# Patient Record
Sex: Male | Born: 1940
Health system: Southern US, Community
[De-identification: ages and names within clinical notes are randomized; demographics above are authoritative.]

## PROBLEM LIST (undated history)

## (undated) DIAGNOSIS — I219 Acute myocardial infarction, unspecified: Secondary | ICD-10-CM

## (undated) DIAGNOSIS — N183 Chronic kidney disease, stage 3 unspecified: Secondary | ICD-10-CM

## (undated) DIAGNOSIS — F32A Depression, unspecified: Secondary | ICD-10-CM

## (undated) DIAGNOSIS — J189 Pneumonia, unspecified organism: Secondary | ICD-10-CM

## (undated) DIAGNOSIS — I209 Angina pectoris, unspecified: Secondary | ICD-10-CM

## (undated) DIAGNOSIS — R0600 Dyspnea, unspecified: Secondary | ICD-10-CM

## (undated) DIAGNOSIS — J449 Chronic obstructive pulmonary disease, unspecified: Secondary | ICD-10-CM

## (undated) DIAGNOSIS — I10 Essential (primary) hypertension: Secondary | ICD-10-CM

## (undated) DIAGNOSIS — F329 Major depressive disorder, single episode, unspecified: Secondary | ICD-10-CM

## (undated) DIAGNOSIS — T4145XA Adverse effect of unspecified anesthetic, initial encounter: Secondary | ICD-10-CM

## (undated) DIAGNOSIS — Z87442 Personal history of urinary calculi: Secondary | ICD-10-CM

## (undated) DIAGNOSIS — E785 Hyperlipidemia, unspecified: Secondary | ICD-10-CM

## (undated) DIAGNOSIS — K219 Gastro-esophageal reflux disease without esophagitis: Secondary | ICD-10-CM

## (undated) DIAGNOSIS — F419 Anxiety disorder, unspecified: Secondary | ICD-10-CM

## (undated) DIAGNOSIS — Z720 Tobacco use: Secondary | ICD-10-CM

## (undated) DIAGNOSIS — M1990 Unspecified osteoarthritis, unspecified site: Secondary | ICD-10-CM

## (undated) DIAGNOSIS — K449 Diaphragmatic hernia without obstruction or gangrene: Secondary | ICD-10-CM

## (undated) DIAGNOSIS — I472 Ventricular tachycardia, unspecified: Secondary | ICD-10-CM

## (undated) DIAGNOSIS — K851 Biliary acute pancreatitis without necrosis or infection: Secondary | ICD-10-CM

## (undated) DIAGNOSIS — Z95810 Presence of automatic (implantable) cardiac defibrillator: Secondary | ICD-10-CM

## (undated) DIAGNOSIS — I5022 Chronic systolic (congestive) heart failure: Secondary | ICD-10-CM

## (undated) DIAGNOSIS — I2510 Atherosclerotic heart disease of native coronary artery without angina pectoris: Secondary | ICD-10-CM

## (undated) DIAGNOSIS — C44311 Basal cell carcinoma of skin of nose: Secondary | ICD-10-CM

## (undated) DIAGNOSIS — N400 Enlarged prostate without lower urinary tract symptoms: Secondary | ICD-10-CM

## (undated) DIAGNOSIS — I729 Aneurysm of unspecified site: Secondary | ICD-10-CM

## (undated) DIAGNOSIS — K5730 Diverticulosis of large intestine without perforation or abscess without bleeding: Secondary | ICD-10-CM

## (undated) DIAGNOSIS — R739 Hyperglycemia, unspecified: Secondary | ICD-10-CM

## (undated) HISTORY — PX: HEMORRHOID BANDING: SHX5850

## (undated) HISTORY — DX: Atherosclerotic heart disease of native coronary artery without angina pectoris: I25.10

## (undated) HISTORY — DX: Gastro-esophageal reflux disease without esophagitis: K21.9

## (undated) HISTORY — DX: Hyperlipidemia, unspecified: E78.5

## (undated) HISTORY — PX: INGUINAL HERNIA REPAIR: SUR1180

## (undated) HISTORY — DX: Chronic obstructive pulmonary disease, unspecified: J44.9

## (undated) HISTORY — DX: Diaphragmatic hernia without obstruction or gangrene: K44.9

## (undated) HISTORY — DX: Biliary acute pancreatitis without necrosis or infection: K85.10

## (undated) HISTORY — PX: COLONOSCOPY: SHX174

## (undated) HISTORY — PX: EYE SURGERY: SHX253

## (undated) HISTORY — DX: Essential (primary) hypertension: I10

## (undated) HISTORY — PX: CATARACT EXTRACTION W/ INTRAOCULAR LENS  IMPLANT, BILATERAL: SHX1307

## (undated) SURGERY — VIDEO BRONCHOSCOPY WITHOUT FLUORO
Anesthesia: Moderate Sedation

---

## 1998-05-27 ENCOUNTER — Inpatient Hospital Stay (HOSPITAL_COMMUNITY): Admission: EM | Admit: 1998-05-27 | Discharge: 1998-05-29 | Payer: Self-pay | Admitting: Emergency Medicine

## 1998-05-27 ENCOUNTER — Encounter: Payer: Self-pay | Admitting: Emergency Medicine

## 2000-02-08 ENCOUNTER — Encounter: Payer: Self-pay | Admitting: Emergency Medicine

## 2000-02-08 ENCOUNTER — Emergency Department (HOSPITAL_COMMUNITY): Admission: EM | Admit: 2000-02-08 | Discharge: 2000-02-08 | Payer: Self-pay | Admitting: Emergency Medicine

## 2000-05-22 ENCOUNTER — Emergency Department (HOSPITAL_COMMUNITY): Admission: EM | Admit: 2000-05-22 | Discharge: 2000-05-22 | Payer: Self-pay | Admitting: Emergency Medicine

## 2001-08-15 ENCOUNTER — Encounter: Payer: Self-pay | Admitting: Internal Medicine

## 2001-08-15 ENCOUNTER — Encounter: Admission: RE | Admit: 2001-08-15 | Discharge: 2001-08-15 | Payer: Self-pay | Admitting: Internal Medicine

## 2002-01-07 ENCOUNTER — Inpatient Hospital Stay (HOSPITAL_COMMUNITY): Admission: EM | Admit: 2002-01-07 | Discharge: 2002-01-08 | Payer: Self-pay | Admitting: Emergency Medicine

## 2002-01-07 ENCOUNTER — Encounter: Payer: Self-pay | Admitting: Emergency Medicine

## 2003-03-31 HISTORY — PX: FOOT SURGERY: SHX648

## 2005-03-27 ENCOUNTER — Ambulatory Visit: Payer: Self-pay | Admitting: Internal Medicine

## 2005-09-25 ENCOUNTER — Encounter: Admission: RE | Admit: 2005-09-25 | Discharge: 2005-09-25 | Payer: Self-pay | Admitting: Internal Medicine

## 2006-03-30 HISTORY — PX: MOHS SURGERY: SUR867

## 2009-09-06 HISTORY — PX: IMPLANTABLE CARDIOVERTER DEFIBRILLATOR IMPLANT: SHX5860

## 2011-03-31 HISTORY — PX: RETINAL DETACHMENT SURGERY: SHX105

## 2011-04-06 DIAGNOSIS — I472 Ventricular tachycardia: Secondary | ICD-10-CM | POA: Diagnosis not present

## 2011-05-28 DIAGNOSIS — E785 Hyperlipidemia, unspecified: Secondary | ICD-10-CM | POA: Diagnosis not present

## 2011-05-28 DIAGNOSIS — J449 Chronic obstructive pulmonary disease, unspecified: Secondary | ICD-10-CM | POA: Diagnosis not present

## 2011-05-28 DIAGNOSIS — I10 Essential (primary) hypertension: Secondary | ICD-10-CM | POA: Diagnosis not present

## 2011-05-28 DIAGNOSIS — F17200 Nicotine dependence, unspecified, uncomplicated: Secondary | ICD-10-CM | POA: Diagnosis not present

## 2011-06-29 DIAGNOSIS — R5381 Other malaise: Secondary | ICD-10-CM | POA: Diagnosis not present

## 2011-06-29 DIAGNOSIS — R5383 Other fatigue: Secondary | ICD-10-CM | POA: Diagnosis not present

## 2011-06-29 DIAGNOSIS — E7800 Pure hypercholesterolemia, unspecified: Secondary | ICD-10-CM | POA: Diagnosis not present

## 2011-07-23 DIAGNOSIS — E785 Hyperlipidemia, unspecified: Secondary | ICD-10-CM | POA: Diagnosis not present

## 2011-07-23 DIAGNOSIS — I472 Ventricular tachycardia: Secondary | ICD-10-CM | POA: Diagnosis not present

## 2011-07-23 DIAGNOSIS — I10 Essential (primary) hypertension: Secondary | ICD-10-CM | POA: Diagnosis not present

## 2011-07-23 DIAGNOSIS — I2510 Atherosclerotic heart disease of native coronary artery without angina pectoris: Secondary | ICD-10-CM | POA: Diagnosis not present

## 2011-08-21 DIAGNOSIS — H43819 Vitreous degeneration, unspecified eye: Secondary | ICD-10-CM | POA: Diagnosis not present

## 2011-08-21 DIAGNOSIS — H04129 Dry eye syndrome of unspecified lacrimal gland: Secondary | ICD-10-CM | POA: Diagnosis not present

## 2011-09-11 DIAGNOSIS — J449 Chronic obstructive pulmonary disease, unspecified: Secondary | ICD-10-CM | POA: Diagnosis not present

## 2011-09-14 DIAGNOSIS — Z961 Presence of intraocular lens: Secondary | ICD-10-CM | POA: Diagnosis not present

## 2011-09-14 DIAGNOSIS — H04129 Dry eye syndrome of unspecified lacrimal gland: Secondary | ICD-10-CM | POA: Diagnosis not present

## 2011-09-14 DIAGNOSIS — H43819 Vitreous degeneration, unspecified eye: Secondary | ICD-10-CM | POA: Diagnosis not present

## 2011-10-03 DIAGNOSIS — R579 Shock, unspecified: Secondary | ICD-10-CM | POA: Diagnosis not present

## 2011-10-03 DIAGNOSIS — I472 Ventricular tachycardia, unspecified: Secondary | ICD-10-CM | POA: Diagnosis not present

## 2011-10-03 DIAGNOSIS — I509 Heart failure, unspecified: Secondary | ICD-10-CM | POA: Diagnosis not present

## 2011-10-03 DIAGNOSIS — R5381 Other malaise: Secondary | ICD-10-CM | POA: Diagnosis not present

## 2011-10-03 DIAGNOSIS — E785 Hyperlipidemia, unspecified: Secondary | ICD-10-CM | POA: Diagnosis not present

## 2011-10-03 DIAGNOSIS — I4729 Other ventricular tachycardia: Secondary | ICD-10-CM | POA: Diagnosis not present

## 2011-10-03 DIAGNOSIS — I252 Old myocardial infarction: Secondary | ICD-10-CM | POA: Diagnosis not present

## 2011-10-03 DIAGNOSIS — Z95810 Presence of automatic (implantable) cardiac defibrillator: Secondary | ICD-10-CM | POA: Diagnosis not present

## 2011-10-03 DIAGNOSIS — K219 Gastro-esophageal reflux disease without esophagitis: Secondary | ICD-10-CM | POA: Diagnosis present

## 2011-10-03 DIAGNOSIS — J45909 Unspecified asthma, uncomplicated: Secondary | ICD-10-CM | POA: Diagnosis not present

## 2011-10-03 DIAGNOSIS — I499 Cardiac arrhythmia, unspecified: Secondary | ICD-10-CM | POA: Diagnosis not present

## 2011-10-03 DIAGNOSIS — I10 Essential (primary) hypertension: Secondary | ICD-10-CM | POA: Diagnosis present

## 2011-10-03 DIAGNOSIS — I2510 Atherosclerotic heart disease of native coronary artery without angina pectoris: Secondary | ICD-10-CM | POA: Diagnosis not present

## 2011-10-03 DIAGNOSIS — J449 Chronic obstructive pulmonary disease, unspecified: Secondary | ICD-10-CM | POA: Diagnosis present

## 2011-10-03 DIAGNOSIS — T82190A Other mechanical complication of cardiac electrode, initial encounter: Secondary | ICD-10-CM | POA: Diagnosis not present

## 2011-10-03 DIAGNOSIS — E7800 Pure hypercholesterolemia, unspecified: Secondary | ICD-10-CM | POA: Diagnosis not present

## 2011-10-03 DIAGNOSIS — R42 Dizziness and giddiness: Secondary | ICD-10-CM | POA: Diagnosis not present

## 2011-10-03 DIAGNOSIS — F17200 Nicotine dependence, unspecified, uncomplicated: Secondary | ICD-10-CM | POA: Diagnosis present

## 2011-10-03 DIAGNOSIS — I059 Rheumatic mitral valve disease, unspecified: Secondary | ICD-10-CM | POA: Diagnosis not present

## 2011-10-03 DIAGNOSIS — Z79899 Other long term (current) drug therapy: Secondary | ICD-10-CM | POA: Diagnosis not present

## 2011-10-12 DIAGNOSIS — Z961 Presence of intraocular lens: Secondary | ICD-10-CM | POA: Diagnosis not present

## 2011-10-12 DIAGNOSIS — H5310 Unspecified subjective visual disturbances: Secondary | ICD-10-CM | POA: Diagnosis not present

## 2011-10-12 DIAGNOSIS — H33019 Retinal detachment with single break, unspecified eye: Secondary | ICD-10-CM | POA: Diagnosis not present

## 2011-10-12 DIAGNOSIS — H33319 Horseshoe tear of retina without detachment, unspecified eye: Secondary | ICD-10-CM | POA: Diagnosis not present

## 2011-10-12 DIAGNOSIS — H43819 Vitreous degeneration, unspecified eye: Secondary | ICD-10-CM | POA: Diagnosis not present

## 2011-10-15 DIAGNOSIS — Z79899 Other long term (current) drug therapy: Secondary | ICD-10-CM | POA: Diagnosis not present

## 2011-10-15 DIAGNOSIS — Z950 Presence of cardiac pacemaker: Secondary | ICD-10-CM | POA: Diagnosis not present

## 2011-10-15 DIAGNOSIS — I729 Aneurysm of unspecified site: Secondary | ICD-10-CM | POA: Diagnosis not present

## 2011-10-15 DIAGNOSIS — I252 Old myocardial infarction: Secondary | ICD-10-CM | POA: Diagnosis not present

## 2011-10-15 DIAGNOSIS — I499 Cardiac arrhythmia, unspecified: Secondary | ICD-10-CM | POA: Diagnosis not present

## 2011-10-15 DIAGNOSIS — F17200 Nicotine dependence, unspecified, uncomplicated: Secondary | ICD-10-CM | POA: Diagnosis not present

## 2011-10-15 DIAGNOSIS — I10 Essential (primary) hypertension: Secondary | ICD-10-CM | POA: Diagnosis not present

## 2011-10-15 DIAGNOSIS — J449 Chronic obstructive pulmonary disease, unspecified: Secondary | ICD-10-CM | POA: Diagnosis not present

## 2011-10-15 DIAGNOSIS — H3320 Serous retinal detachment, unspecified eye: Secondary | ICD-10-CM | POA: Diagnosis not present

## 2011-10-15 DIAGNOSIS — I2510 Atherosclerotic heart disease of native coronary artery without angina pectoris: Secondary | ICD-10-CM | POA: Diagnosis not present

## 2011-10-15 DIAGNOSIS — K219 Gastro-esophageal reflux disease without esophagitis: Secondary | ICD-10-CM | POA: Diagnosis not present

## 2011-10-15 DIAGNOSIS — Z7982 Long term (current) use of aspirin: Secondary | ICD-10-CM | POA: Diagnosis not present

## 2011-10-15 DIAGNOSIS — H33019 Retinal detachment with single break, unspecified eye: Secondary | ICD-10-CM | POA: Diagnosis not present

## 2011-10-15 DIAGNOSIS — Z7902 Long term (current) use of antithrombotics/antiplatelets: Secondary | ICD-10-CM | POA: Diagnosis not present

## 2011-10-15 DIAGNOSIS — H33009 Unspecified retinal detachment with retinal break, unspecified eye: Secondary | ICD-10-CM | POA: Diagnosis not present

## 2011-10-19 DIAGNOSIS — I2510 Atherosclerotic heart disease of native coronary artery without angina pectoris: Secondary | ICD-10-CM | POA: Diagnosis not present

## 2011-10-19 DIAGNOSIS — I808 Phlebitis and thrombophlebitis of other sites: Secondary | ICD-10-CM | POA: Diagnosis not present

## 2011-10-19 DIAGNOSIS — F17200 Nicotine dependence, unspecified, uncomplicated: Secondary | ICD-10-CM | POA: Diagnosis not present

## 2011-10-19 DIAGNOSIS — K219 Gastro-esophageal reflux disease without esophagitis: Secondary | ICD-10-CM | POA: Diagnosis not present

## 2011-10-19 DIAGNOSIS — J449 Chronic obstructive pulmonary disease, unspecified: Secondary | ICD-10-CM | POA: Diagnosis not present

## 2011-11-05 DIAGNOSIS — J45909 Unspecified asthma, uncomplicated: Secondary | ICD-10-CM | POA: Diagnosis not present

## 2011-11-05 DIAGNOSIS — I472 Ventricular tachycardia: Secondary | ICD-10-CM | POA: Diagnosis not present

## 2011-11-05 DIAGNOSIS — I10 Essential (primary) hypertension: Secondary | ICD-10-CM | POA: Diagnosis not present

## 2011-11-05 DIAGNOSIS — E785 Hyperlipidemia, unspecified: Secondary | ICD-10-CM | POA: Diagnosis not present

## 2011-11-12 DIAGNOSIS — R0989 Other specified symptoms and signs involving the circulatory and respiratory systems: Secondary | ICD-10-CM | POA: Diagnosis not present

## 2011-11-12 DIAGNOSIS — F17200 Nicotine dependence, unspecified, uncomplicated: Secondary | ICD-10-CM | POA: Diagnosis not present

## 2011-11-12 DIAGNOSIS — R0609 Other forms of dyspnea: Secondary | ICD-10-CM | POA: Diagnosis not present

## 2011-11-12 DIAGNOSIS — G4733 Obstructive sleep apnea (adult) (pediatric): Secondary | ICD-10-CM | POA: Diagnosis not present

## 2011-11-12 DIAGNOSIS — J449 Chronic obstructive pulmonary disease, unspecified: Secondary | ICD-10-CM | POA: Diagnosis not present

## 2011-11-26 DIAGNOSIS — J441 Chronic obstructive pulmonary disease with (acute) exacerbation: Secondary | ICD-10-CM | POA: Diagnosis not present

## 2011-11-26 DIAGNOSIS — I252 Old myocardial infarction: Secondary | ICD-10-CM | POA: Diagnosis not present

## 2011-11-26 DIAGNOSIS — J449 Chronic obstructive pulmonary disease, unspecified: Secondary | ICD-10-CM | POA: Diagnosis not present

## 2011-11-26 DIAGNOSIS — Z87891 Personal history of nicotine dependence: Secondary | ICD-10-CM | POA: Diagnosis not present

## 2011-11-26 DIAGNOSIS — I2589 Other forms of chronic ischemic heart disease: Secondary | ICD-10-CM | POA: Diagnosis not present

## 2011-11-26 DIAGNOSIS — Z950 Presence of cardiac pacemaker: Secondary | ICD-10-CM | POA: Diagnosis not present

## 2011-11-26 DIAGNOSIS — I209 Angina pectoris, unspecified: Secondary | ICD-10-CM | POA: Diagnosis not present

## 2011-11-26 DIAGNOSIS — R079 Chest pain, unspecified: Secondary | ICD-10-CM | POA: Diagnosis not present

## 2011-12-03 DIAGNOSIS — R0789 Other chest pain: Secondary | ICD-10-CM | POA: Diagnosis not present

## 2011-12-03 DIAGNOSIS — E559 Vitamin D deficiency, unspecified: Secondary | ICD-10-CM | POA: Diagnosis not present

## 2011-12-03 DIAGNOSIS — F411 Generalized anxiety disorder: Secondary | ICD-10-CM | POA: Diagnosis not present

## 2011-12-03 DIAGNOSIS — I10 Essential (primary) hypertension: Secondary | ICD-10-CM | POA: Diagnosis not present

## 2011-12-21 DIAGNOSIS — D485 Neoplasm of uncertain behavior of skin: Secondary | ICD-10-CM | POA: Diagnosis not present

## 2011-12-21 DIAGNOSIS — L988 Other specified disorders of the skin and subcutaneous tissue: Secondary | ICD-10-CM | POA: Diagnosis not present

## 2011-12-21 DIAGNOSIS — L738 Other specified follicular disorders: Secondary | ICD-10-CM | POA: Diagnosis not present

## 2011-12-21 DIAGNOSIS — D239 Other benign neoplasm of skin, unspecified: Secondary | ICD-10-CM | POA: Diagnosis not present

## 2011-12-21 DIAGNOSIS — Z85828 Personal history of other malignant neoplasm of skin: Secondary | ICD-10-CM | POA: Diagnosis not present

## 2011-12-21 DIAGNOSIS — I714 Abdominal aortic aneurysm, without rupture: Secondary | ICD-10-CM | POA: Diagnosis not present

## 2012-01-22 DIAGNOSIS — Z961 Presence of intraocular lens: Secondary | ICD-10-CM | POA: Diagnosis not present

## 2012-01-22 DIAGNOSIS — H04129 Dry eye syndrome of unspecified lacrimal gland: Secondary | ICD-10-CM | POA: Diagnosis not present

## 2012-01-22 DIAGNOSIS — H33019 Retinal detachment with single break, unspecified eye: Secondary | ICD-10-CM | POA: Diagnosis not present

## 2012-01-22 DIAGNOSIS — H43819 Vitreous degeneration, unspecified eye: Secondary | ICD-10-CM | POA: Diagnosis not present

## 2012-01-28 DIAGNOSIS — G4730 Sleep apnea, unspecified: Secondary | ICD-10-CM | POA: Diagnosis not present

## 2012-01-28 DIAGNOSIS — R0609 Other forms of dyspnea: Secondary | ICD-10-CM | POA: Diagnosis not present

## 2012-01-28 DIAGNOSIS — J449 Chronic obstructive pulmonary disease, unspecified: Secondary | ICD-10-CM | POA: Diagnosis not present

## 2012-01-28 DIAGNOSIS — F17200 Nicotine dependence, unspecified, uncomplicated: Secondary | ICD-10-CM | POA: Diagnosis not present

## 2012-02-05 DIAGNOSIS — I472 Ventricular tachycardia: Secondary | ICD-10-CM | POA: Diagnosis not present

## 2012-02-05 DIAGNOSIS — E785 Hyperlipidemia, unspecified: Secondary | ICD-10-CM | POA: Diagnosis not present

## 2012-02-05 DIAGNOSIS — I10 Essential (primary) hypertension: Secondary | ICD-10-CM | POA: Diagnosis not present

## 2012-02-05 DIAGNOSIS — I2510 Atherosclerotic heart disease of native coronary artery without angina pectoris: Secondary | ICD-10-CM | POA: Diagnosis not present

## 2012-02-18 DIAGNOSIS — I714 Abdominal aortic aneurysm, without rupture: Secondary | ICD-10-CM | POA: Diagnosis not present

## 2012-02-18 DIAGNOSIS — I10 Essential (primary) hypertension: Secondary | ICD-10-CM | POA: Diagnosis not present

## 2012-02-18 DIAGNOSIS — F329 Major depressive disorder, single episode, unspecified: Secondary | ICD-10-CM | POA: Diagnosis not present

## 2012-02-18 DIAGNOSIS — F17200 Nicotine dependence, unspecified, uncomplicated: Secondary | ICD-10-CM | POA: Diagnosis not present

## 2012-02-18 DIAGNOSIS — Z23 Encounter for immunization: Secondary | ICD-10-CM | POA: Diagnosis not present

## 2012-04-13 DIAGNOSIS — G4733 Obstructive sleep apnea (adult) (pediatric): Secondary | ICD-10-CM | POA: Diagnosis not present

## 2012-04-19 DIAGNOSIS — H04129 Dry eye syndrome of unspecified lacrimal gland: Secondary | ICD-10-CM | POA: Diagnosis not present

## 2012-04-19 DIAGNOSIS — H33019 Retinal detachment with single break, unspecified eye: Secondary | ICD-10-CM | POA: Diagnosis not present

## 2012-04-19 DIAGNOSIS — H43819 Vitreous degeneration, unspecified eye: Secondary | ICD-10-CM | POA: Diagnosis not present

## 2012-04-19 DIAGNOSIS — Z961 Presence of intraocular lens: Secondary | ICD-10-CM | POA: Diagnosis not present

## 2012-04-26 DIAGNOSIS — I472 Ventricular tachycardia: Secondary | ICD-10-CM | POA: Diagnosis not present

## 2012-04-28 DIAGNOSIS — G4733 Obstructive sleep apnea (adult) (pediatric): Secondary | ICD-10-CM | POA: Diagnosis not present

## 2012-05-19 DIAGNOSIS — F17200 Nicotine dependence, unspecified, uncomplicated: Secondary | ICD-10-CM | POA: Diagnosis not present

## 2012-05-19 DIAGNOSIS — J449 Chronic obstructive pulmonary disease, unspecified: Secondary | ICD-10-CM | POA: Diagnosis not present

## 2012-05-19 DIAGNOSIS — R0989 Other specified symptoms and signs involving the circulatory and respiratory systems: Secondary | ICD-10-CM | POA: Diagnosis not present

## 2012-05-19 DIAGNOSIS — G4730 Sleep apnea, unspecified: Secondary | ICD-10-CM | POA: Diagnosis not present

## 2012-05-19 DIAGNOSIS — R0609 Other forms of dyspnea: Secondary | ICD-10-CM | POA: Diagnosis not present

## 2012-05-25 DIAGNOSIS — I252 Old myocardial infarction: Secondary | ICD-10-CM | POA: Diagnosis not present

## 2012-05-25 DIAGNOSIS — Z9861 Coronary angioplasty status: Secondary | ICD-10-CM | POA: Diagnosis not present

## 2012-05-25 DIAGNOSIS — R0602 Shortness of breath: Secondary | ICD-10-CM | POA: Diagnosis not present

## 2012-05-25 DIAGNOSIS — I2510 Atherosclerotic heart disease of native coronary artery without angina pectoris: Secondary | ICD-10-CM | POA: Diagnosis not present

## 2012-05-25 DIAGNOSIS — Z95810 Presence of automatic (implantable) cardiac defibrillator: Secondary | ICD-10-CM | POA: Diagnosis not present

## 2012-05-25 DIAGNOSIS — R0789 Other chest pain: Secondary | ICD-10-CM | POA: Diagnosis not present

## 2012-05-25 DIAGNOSIS — J438 Other emphysema: Secondary | ICD-10-CM | POA: Diagnosis not present

## 2012-05-25 DIAGNOSIS — I472 Ventricular tachycardia: Secondary | ICD-10-CM | POA: Diagnosis not present

## 2012-05-25 DIAGNOSIS — K219 Gastro-esophageal reflux disease without esophagitis: Secondary | ICD-10-CM | POA: Diagnosis not present

## 2012-05-25 DIAGNOSIS — J45909 Unspecified asthma, uncomplicated: Secondary | ICD-10-CM | POA: Diagnosis not present

## 2012-05-25 DIAGNOSIS — E785 Hyperlipidemia, unspecified: Secondary | ICD-10-CM | POA: Diagnosis not present

## 2012-05-25 DIAGNOSIS — I10 Essential (primary) hypertension: Secondary | ICD-10-CM | POA: Diagnosis not present

## 2012-07-31 DIAGNOSIS — G459 Transient cerebral ischemic attack, unspecified: Secondary | ICD-10-CM | POA: Diagnosis not present

## 2012-07-31 DIAGNOSIS — R0989 Other specified symptoms and signs involving the circulatory and respiratory systems: Secondary | ICD-10-CM | POA: Diagnosis not present

## 2012-07-31 DIAGNOSIS — K219 Gastro-esophageal reflux disease without esophagitis: Secondary | ICD-10-CM | POA: Diagnosis not present

## 2012-07-31 DIAGNOSIS — R4182 Altered mental status, unspecified: Secondary | ICD-10-CM | POA: Diagnosis not present

## 2012-07-31 DIAGNOSIS — F29 Unspecified psychosis not due to a substance or known physiological condition: Secondary | ICD-10-CM | POA: Diagnosis not present

## 2012-07-31 DIAGNOSIS — I10 Essential (primary) hypertension: Secondary | ICD-10-CM | POA: Diagnosis not present

## 2012-07-31 DIAGNOSIS — Z95810 Presence of automatic (implantable) cardiac defibrillator: Secondary | ICD-10-CM | POA: Diagnosis not present

## 2012-07-31 DIAGNOSIS — Z79899 Other long term (current) drug therapy: Secondary | ICD-10-CM | POA: Diagnosis not present

## 2012-07-31 DIAGNOSIS — I252 Old myocardial infarction: Secondary | ICD-10-CM | POA: Diagnosis not present

## 2012-07-31 DIAGNOSIS — R0609 Other forms of dyspnea: Secondary | ICD-10-CM | POA: Diagnosis not present

## 2012-08-18 DIAGNOSIS — S46819A Strain of other muscles, fascia and tendons at shoulder and upper arm level, unspecified arm, initial encounter: Secondary | ICD-10-CM | POA: Diagnosis not present

## 2012-08-18 DIAGNOSIS — I472 Ventricular tachycardia: Secondary | ICD-10-CM | POA: Diagnosis not present

## 2012-10-28 DIAGNOSIS — R739 Hyperglycemia, unspecified: Secondary | ICD-10-CM

## 2012-10-28 HISTORY — DX: Hyperglycemia, unspecified: R73.9

## 2012-11-18 ENCOUNTER — Encounter: Payer: Self-pay | Admitting: Cardiology

## 2012-11-19 DIAGNOSIS — I2510 Atherosclerotic heart disease of native coronary artery without angina pectoris: Secondary | ICD-10-CM | POA: Diagnosis not present

## 2012-11-19 DIAGNOSIS — J449 Chronic obstructive pulmonary disease, unspecified: Secondary | ICD-10-CM | POA: Diagnosis not present

## 2012-11-19 DIAGNOSIS — Z95810 Presence of automatic (implantable) cardiac defibrillator: Secondary | ICD-10-CM | POA: Diagnosis not present

## 2012-11-19 DIAGNOSIS — K5090 Crohn's disease, unspecified, without complications: Secondary | ICD-10-CM | POA: Diagnosis not present

## 2012-11-19 DIAGNOSIS — F17200 Nicotine dependence, unspecified, uncomplicated: Secondary | ICD-10-CM | POA: Diagnosis not present

## 2012-11-19 DIAGNOSIS — K219 Gastro-esophageal reflux disease without esophagitis: Secondary | ICD-10-CM | POA: Diagnosis not present

## 2012-11-19 DIAGNOSIS — R0602 Shortness of breath: Secondary | ICD-10-CM | POA: Diagnosis not present

## 2012-11-19 DIAGNOSIS — R52 Pain, unspecified: Secondary | ICD-10-CM | POA: Diagnosis not present

## 2012-11-19 DIAGNOSIS — Z79899 Other long term (current) drug therapy: Secondary | ICD-10-CM | POA: Diagnosis not present

## 2012-11-19 DIAGNOSIS — R0609 Other forms of dyspnea: Secondary | ICD-10-CM | POA: Diagnosis not present

## 2012-11-19 DIAGNOSIS — Z9861 Coronary angioplasty status: Secondary | ICD-10-CM | POA: Diagnosis not present

## 2012-11-19 DIAGNOSIS — I252 Old myocardial infarction: Secondary | ICD-10-CM | POA: Diagnosis not present

## 2012-11-19 DIAGNOSIS — R61 Generalized hyperhidrosis: Secondary | ICD-10-CM | POA: Diagnosis not present

## 2012-11-19 DIAGNOSIS — Z882 Allergy status to sulfonamides status: Secondary | ICD-10-CM | POA: Diagnosis not present

## 2012-11-19 DIAGNOSIS — F411 Generalized anxiety disorder: Secondary | ICD-10-CM | POA: Diagnosis not present

## 2012-11-19 DIAGNOSIS — E785 Hyperlipidemia, unspecified: Secondary | ICD-10-CM | POA: Diagnosis not present

## 2012-11-19 DIAGNOSIS — R079 Chest pain, unspecified: Secondary | ICD-10-CM | POA: Diagnosis not present

## 2012-11-23 ENCOUNTER — Encounter: Payer: Self-pay | Admitting: Cardiology

## 2012-11-23 ENCOUNTER — Ambulatory Visit (INDEPENDENT_AMBULATORY_CARE_PROVIDER_SITE_OTHER): Payer: Medicare Other | Admitting: Cardiology

## 2012-11-23 ENCOUNTER — Ambulatory Visit (INDEPENDENT_AMBULATORY_CARE_PROVIDER_SITE_OTHER): Payer: Medicare Other | Admitting: *Deleted

## 2012-11-23 VITALS — BP 120/86 | HR 77 | Wt 202.0 lb

## 2012-11-23 DIAGNOSIS — I2510 Atherosclerotic heart disease of native coronary artery without angina pectoris: Secondary | ICD-10-CM

## 2012-11-23 DIAGNOSIS — I10 Essential (primary) hypertension: Secondary | ICD-10-CM | POA: Insufficient documentation

## 2012-11-23 DIAGNOSIS — F17200 Nicotine dependence, unspecified, uncomplicated: Secondary | ICD-10-CM

## 2012-11-23 DIAGNOSIS — I2589 Other forms of chronic ischemic heart disease: Secondary | ICD-10-CM

## 2012-11-23 DIAGNOSIS — I472 Ventricular tachycardia: Secondary | ICD-10-CM

## 2012-11-23 DIAGNOSIS — I255 Ischemic cardiomyopathy: Secondary | ICD-10-CM

## 2012-11-23 DIAGNOSIS — E785 Hyperlipidemia, unspecified: Secondary | ICD-10-CM | POA: Insufficient documentation

## 2012-11-23 DIAGNOSIS — Z720 Tobacco use: Secondary | ICD-10-CM

## 2012-11-23 DIAGNOSIS — Z95810 Presence of automatic (implantable) cardiac defibrillator: Secondary | ICD-10-CM

## 2012-11-23 DIAGNOSIS — J410 Simple chronic bronchitis: Secondary | ICD-10-CM | POA: Insufficient documentation

## 2012-11-23 DIAGNOSIS — R079 Chest pain, unspecified: Secondary | ICD-10-CM

## 2012-11-23 NOTE — Assessment & Plan Note (Signed)
Continue present blood pressure medications. 

## 2012-11-23 NOTE — Patient Instructions (Addendum)
FOLLOW UP WITH DR Katrinka Blazing AS SCHEDULED  STOP PLAVIX  THE OFFICE WILL CALL WITH A FOLLOW UP WITH THE DEVICE CLINIC

## 2012-11-23 NOTE — Assessment & Plan Note (Signed)
Continue amiodarone. He had a chest x-ray performed at [facility] recently and we will obtain those results. He also had blood work done and we will make sure that he has had recent TSH and liver functions.

## 2012-11-23 NOTE — Assessment & Plan Note (Signed)
Patient counseled on discontinuing. 

## 2012-11-23 NOTE — Assessment & Plan Note (Signed)
ICD was interrogated today by industry and will be reviewed by electrophysiology. We will arrange followup with electrophysiology in our office for future management.

## 2012-11-23 NOTE — Assessment & Plan Note (Signed)
Continue aspirin but discontinue Plavix as it has been 3 years since his previous PCI. Continue statin. Obtain all records from [place] concerning previous cardiac history.

## 2012-11-23 NOTE — Assessment & Plan Note (Signed)
Continue statin. 

## 2012-11-23 NOTE — Assessment & Plan Note (Signed)
Continue ACE inhibitor and beta blocker. 

## 2012-11-23 NOTE — Progress Notes (Signed)
HPI: 72 year old male for evaluation of coronary artery disease. Patient is status post myocardial infarction in 1994 complicated by cardiac arrest. He then had PCI. He was followed by Dr. Katrinka Blazing but then moved to [place]. In 2011 the patient had a myocardial infarction by his report and sounds to also have had ventricular tachycardia at the time of presentation. He had PCI and then what sounds to be an EP study and was found to be inducible. An ICD was implanted and he ultimately was started on amiodarone as well. I do not have records available. He recently moved back to this area in July. He was arranged to see electrophysiology to check his ICD but was scheduled in the [place] today. He does have dyspnea on exertion. No orthopnea, PND, pedal edema or syncope. He had a vague discomfort in his throat approximately 2 weeks ago and was seen at [facility] He apparently ruled out but those records are not available.  Current Outpatient Prescriptions  Medication Sig Dispense Refill  . amiodarone (PACERONE) 200 MG tablet Take 200 mg by mouth daily.      [name] aspirin 81 MG tablet Take 81 mg by mouth daily.      [name] atorvastatin (LIPITOR) 80 MG tablet Take 80 mg by mouth daily.      . benazepril (LOTENSIN) 40 MG tablet Take 40 mg by mouth daily.      . busPIRone (BUSPAR) 15 MG tablet Take 15 mg by mouth 2 (two) times daily.      . carvedilol (COREG) 12.5 MG tablet Take 12.5 mg by mouth 2 (two) times daily with a meal.      . Choline Fenofibrate (TRILIPIX) 135 MG capsule Take 135 mg by mouth daily.      . clopidogrel (PLAVIX) 75 MG tablet Take 75 mg by mouth daily.      . Fluticasone-Salmeterol (ADVAIR) 250-50 MCG/DOSE AEPB Inhale 1 puff into the lungs every 12 (twelve) hours.      . Ipratropium-Albuterol (COMBIVENT RESPIMAT) 20-100 MCG/ACT AERS respimat Inhale into the lungs as directed.      [name] omeprazole (PRILOSEC) 20 MG capsule Take 20 mg by mouth 2 (two) times daily.      [name] PARoxetine  (PAXIL) 10 MG tablet Take 10 mg by mouth every morning.      [name] PARoxetine (PAXIL) 40 MG tablet Take 40 mg by mouth every morning.      [name] spironolactone (ALDACTONE) 25 MG tablet Take 25 mg by mouth daily.      . tamsulosin (FLOMAX) 0.4 MG CAPS capsule Take by mouth.       No current facility-administered medications for this visit.    Allergies  Allergen Reactions  . Sulfa Antibiotics      Past Medical History  Diagnosis Date  . Hypertension   . Hyperlipidemia   . COPD (chronic obstructive pulmonary disease)   . GERD (gastroesophageal reflux disease)   . Diverticulitis   . CAD (coronary artery disease)   . ICD (implantable cardioverter-defibrillator) battery depletion     Past Surgical History  Procedure Laterality Date  . Hernia repair    . Foot surgery    . Hemorrhoid banding    . Retinal detachment surgery    . Angioplasty      History   Social History  . Marital Status: Married    Spouse Name: N/A    Number of Children: N/A  . Years of Education: N/A   Occupational History  .  Not on file.   Social History Main Topics  . Smoking status: Current Every Day Smoker  . Smokeless tobacco: Not on file  . Alcohol Use: Yes  . Drug Use: No  . Sexual Activity: Not on file   Other Topics Concern  . Not on file   Social History Narrative  . No narrative on file    Family History  Problem Relation Age of Onset  . Heart attack Brother   . CAD Father   . CAD Mother     ROS: no fevers or chills, productive cough, hemoptysis, dysphasia, odynophagia, melena, hematochezia, dysuria, hematuria, rash, seizure activity, orthopnea, PND, pedal edema, claudication. Remaining systems are negative.  Physical Exam:   Blood pressure 120/86, pulse 77, weight 202 lb (91.627 kg).  General:  Well developed/well nourished in NAD Skin warm/dry Patient not depressed No peripheral clubbing Back-normal HEENT-normal/normal eyelids Neck supple/normal carotid upstroke  bilaterally; no bruits; no JVD; no thyromegaly chest - diminished breath sounds throughout, ICD left chest CV - RRR/normal S1 and S2; no murmurs, rubs or gallops;  PMI nondisplaced Abdomen -NT/ND, no HSM, no mass, + bowel sounds, no bruit 2+ femoral pulses, no bruits Ext-no edema, chords, 2+ DP Neuro-grossly nonfocal  ECG sinus rhythm at a rate of 77. Prior inferior infarct. Inferior lateral T-wave inversion.

## 2012-11-23 NOTE — Assessment & Plan Note (Signed)
Patient had date throat discomfort and was seen at [facility]. We will obtain all of those records. We discussed a functional study for risk stratification. However long-term he will be followed by Dr. Katrinka Blazing who followed him previously. He will discuss this with Dr. Katrinka Blazing at time of office visit in September which has already been scheduled.

## 2012-11-24 ENCOUNTER — Observation Stay (HOSPITAL_COMMUNITY)
Admission: EM | Admit: 2012-11-24 | Discharge: 2012-11-25 | Disposition: A | Discharge status: Home / Self Care | Payer: Medicare Other | Attending: Interventional Cardiology | Admitting: Interventional Cardiology

## 2012-11-24 ENCOUNTER — Encounter (HOSPITAL_COMMUNITY): Payer: Self-pay | Admitting: Adult Health

## 2012-11-24 ENCOUNTER — Emergency Department (HOSPITAL_COMMUNITY): Payer: Medicare Other

## 2012-11-24 DIAGNOSIS — I209 Angina pectoris, unspecified: Secondary | ICD-10-CM | POA: Insufficient documentation

## 2012-11-24 DIAGNOSIS — J449 Chronic obstructive pulmonary disease, unspecified: Secondary | ICD-10-CM | POA: Diagnosis not present

## 2012-11-24 DIAGNOSIS — R079 Chest pain, unspecified: Secondary | ICD-10-CM | POA: Diagnosis not present

## 2012-11-24 DIAGNOSIS — I255 Ischemic cardiomyopathy: Secondary | ICD-10-CM

## 2012-11-24 DIAGNOSIS — I10 Essential (primary) hypertension: Secondary | ICD-10-CM

## 2012-11-24 DIAGNOSIS — I2510 Atherosclerotic heart disease of native coronary artery without angina pectoris: Principal | ICD-10-CM | POA: Insufficient documentation

## 2012-11-24 HISTORY — DX: Major depressive disorder, single episode, unspecified: F32.9

## 2012-11-24 HISTORY — DX: Aneurysm of unspecified site: I72.9

## 2012-11-24 HISTORY — DX: Depression, unspecified: F32.A

## 2012-11-24 HISTORY — DX: Acute myocardial infarction, unspecified: I21.9

## 2012-11-24 LAB — BASIC METABOLIC PANEL
BUN: 11 mg/dL (ref 6–23)
CO2: 26 mEq/L (ref 19–32)
Calcium: 9.6 mg/dL (ref 8.4–10.5)
Glucose, Bld: 86 mg/dL (ref 70–99)
Sodium: 136 mEq/L (ref 135–145)

## 2012-11-24 LAB — CBC
HCT: 43.1 % (ref 39.0–52.0)
Hemoglobin: 14.9 g/dL (ref 13.0–17.0)
MCH: 32 pg (ref 26.0–34.0)
MCHC: 34.6 g/dL (ref 30.0–36.0)
RBC: 4.65 MIL/uL (ref 4.22–5.81)

## 2012-11-24 LAB — POCT I-STAT TROPONIN I

## 2012-11-24 MED ORDER — IPRATROPIUM BROMIDE 0.02 % IN SOLN
0.5000 mg | Freq: Once | RESPIRATORY_TRACT | Status: AC
  Administered 2012-11-24: 0.5 mg via RESPIRATORY_TRACT
  Filled 2012-11-24: qty 2.5

## 2012-11-24 MED ORDER — ASPIRIN 325 MG PO TABS
325.0000 mg | ORAL_TABLET | ORAL | Status: AC
  Administered 2012-11-25: 325 mg via ORAL
  Filled 2012-11-24: qty 1

## 2012-11-24 MED ORDER — NITROGLYCERIN 0.4 MG SL SUBL: 0.4000 mg | SUBLINGUAL_TABLET | SUBLINGUAL | Status: DC | PRN

## 2012-11-24 MED ORDER — ALBUTEROL SULFATE (5 MG/ML) 0.5% IN NEBU
2.5000 mg | INHALATION_SOLUTION | Freq: Once | RESPIRATORY_TRACT | Status: AC
  Administered 2012-11-24: 2.5 mg via RESPIRATORY_TRACT
  Filled 2012-11-24 (×2): qty 0.5

## 2012-11-24 NOTE — ED Provider Notes (Signed)
CSN: [id number]     Arrival date & time 11/24/12  2018 History   First MD Initiated Contact with Patient 11/24/12 2257     Chief Complaint  Patient presents with  . Chest Pain   (Consider location/radiation/quality/duration/timing/severity/associated sxs/prior Treatment) HPI 72 yo male presents to the ER from home with complaint of chest pain.  Pain started around 745 tonight while sitting.  It was associated with diaphoresis and sob.  Sxs lasted about 5 minutes, resolved with one nitro.  Pt reports he had similar sxs last Saturday, 5 days ago.  Pt was seen at [facility], and admitted.  Negative enzymes and ekg, was d/c.  Pt was seen yesterday by [facility] (Pt is followed by Dr Katrinka Blazing with [facility])  Pain was substernal with radiation into the left side.  Pt has significant pmh of MI x 2, with cardiac arrest associated with first, and Vtach with second.  Pt now has ICD.    Past Medical History  Diagnosis Date  . Hypertension   . Hyperlipidemia   . COPD (chronic obstructive pulmonary disease)   . GERD (gastroesophageal reflux disease)   . Diverticulitis   . CAD (coronary artery disease)   . ICD (implantable cardioverter-defibrillator) battery depletion    Past Surgical History  Procedure Laterality Date  . Hernia repair    . Foot surgery    . Hemorrhoid banding    . Retinal detachment surgery    . Angioplasty     Family History  Problem Relation Age of Onset  . Heart attack Brother   . CAD Father   . CAD Mother    History  Substance Use Topics  . Smoking status: Current Every Day Smoker  . Smokeless tobacco: Not on file  . Alcohol Use: Yes    Review of Systems  All other systems reviewed and are negative.    Allergies  Sulfa antibiotics  Home Medications   Current Outpatient Rx  Name  Route  Sig  Dispense  Refill  . amiodarone (PACERONE) 200 MG tablet   Oral   Take 200 mg by mouth daily.         [name] aspirin 81 MG tablet   Oral   Take 81 mg by mouth daily.          [name] atorvastatin (LIPITOR) 80 MG tablet   Oral   Take 80 mg by mouth daily.         . benazepril (LOTENSIN) 40 MG tablet   Oral   Take 20 mg by mouth daily.          . busPIRone (BUSPAR) 15 MG tablet   Oral   Take 15 mg by mouth 2 (two) times daily.         . carvedilol (COREG) 12.5 MG tablet   Oral   Take 12.5 mg by mouth 2 (two) times daily with a meal.         . Choline Fenofibrate (TRILIPIX) 135 MG capsule   Oral   Take 135 mg by mouth daily.         . Fluticasone-Salmeterol (ADVAIR) 250-50 MCG/DOSE AEPB   Inhalation   Inhale 1 puff into the lungs every 12 (twelve) hours.         . Ipratropium-Albuterol (COMBIVENT RESPIMAT) 20-100 MCG/ACT AERS respimat   Inhalation   Inhale into the lungs as directed.         [name] omeprazole (PRILOSEC) 20 MG capsule   Oral   Take 20  mg by mouth 2 (two) times daily.         [name] PARoxetine (PAXIL) 10 MG tablet   Oral   Take 10 mg by mouth every morning.         [name] PARoxetine (PAXIL) 40 MG tablet   Oral   Take 40 mg by mouth every morning.         [name] spironolactone (ALDACTONE) 25 MG tablet   Oral   Take 25 mg by mouth daily.         . tamsulosin (FLOMAX) 0.4 MG CAPS capsule   Oral   Take by mouth.          BP 103/70  Pulse 60  Temp(Src) 98.2 F (36.8 C) (Oral)  Resp 20  SpO2 98% Physical Exam  Nursing note and vitals reviewed. Constitutional: He is oriented to person, place, and time. He appears well-developed and well-nourished.  HENT:  Head: Normocephalic and atraumatic.  Nose: Nose normal.  Mouth/Throat: Oropharynx is clear and moist.  Eyes: Conjunctivae and EOM are normal. Pupils are equal, round, and reactive to light.  Neck: Normal range of motion. Neck supple. No JVD present. No tracheal deviation present. No thyromegaly present.  Cardiovascular: Normal rate, regular rhythm, normal heart sounds and intact distal pulses.  Exam reveals no gallop and no friction rub.   No murmur  heard. Pulmonary/Chest: Effort normal. No stridor. No respiratory distress. He has wheezes. He has no rales. He exhibits no tenderness.  Abdominal: Soft. Bowel sounds are normal. He exhibits no distension and no mass. There is no tenderness. There is no rebound and no guarding.  Musculoskeletal: Normal range of motion. He exhibits no edema and no tenderness.  Lymphadenopathy:    He has no cervical adenopathy.  Neurological: He is alert and oriented to person, place, and time. He exhibits normal muscle tone. Coordination normal.  Skin: Skin is warm and dry. No rash noted. No erythema. No pallor.  Psychiatric: He has a normal mood and affect. His behavior is normal. Judgment and thought content normal.    ED Course  Procedures (including critical care time) Labs Review Labs Reviewed  BASIC METABOLIC PANEL - Abnormal; Notable for the following:    GFR calc non Af Amer 72 (*)    GFR calc Af Amer 83 (*)    All other components within normal limits  CBC  TROPONIN I  POCT I-STAT TROPONIN I   Imaging Review Dg Chest 2 View  11/24/2012   *RADIOLOGY REPORT*  Clinical Data: Chest pain  CHEST - 2 VIEW  Comparison: 09/25/2005 abdominal CT  Findings: Normal heart size.  Mild aortic tortuosity.  Left chest wall battery pack with lead tips projecting over the expected location of the right atrium and right ventricle.  Mild interstitial coarsening and hyperinflation.  No focal consolidation.  No pleural effusion or pneumothorax. Chronic mild elevation of the left hemidiaphragm.  No acute osseous finding.  IMPRESSION: COPD.  No acute process identified.   Original Report Authenticated By: Jearld Lesch, M.D.    Date: 11/24/2012  Rate:77  Rhythm: normal sinus rhythm  QRS Axis: normal  Intervals: normal  ST/T Wave abnormalities: t wave inversions laterally, flattening inferiorly  Conduction Disutrbances:nonspecific intraventricular conduction delay  Narrative Interpretation:   Old EKG Reviewed:  unchanged    MDM   1. Chest pain   2. CAD (coronary artery disease)     72 yo male with significant h/o CAD, MI, now with recurrent chest pain.  Feel he will need admission with further evaluation of chest pain.  Last cath per his history 2011, two stents placed at that time.  Some mild wheezing on exam today, do not feel sxs c/w COPD exacerbation or PE.     Olivia Mackie, MD 11/24/12 [phone]

## 2012-11-24 NOTE — ED Notes (Signed)
Presents with sternal chest pain that radiated to left side associated with SOB, diaphoresis. Pain began at 19:45 and was relieved with one nitro. Nothing makes pain worse. Pain began while sitting down.

## 2012-11-25 ENCOUNTER — Encounter (HOSPITAL_COMMUNITY)
Admission: EM | Disposition: A | Discharge status: Home / Self Care | Payer: Self-pay | Source: Home / Self Care | Attending: Interventional Cardiology

## 2012-11-25 ENCOUNTER — Encounter (HOSPITAL_COMMUNITY): Payer: Self-pay | Admitting: Anesthesiology

## 2012-11-25 DIAGNOSIS — R079 Chest pain, unspecified: Secondary | ICD-10-CM | POA: Diagnosis not present

## 2012-11-25 DIAGNOSIS — I209 Angina pectoris, unspecified: Secondary | ICD-10-CM | POA: Diagnosis not present

## 2012-11-25 DIAGNOSIS — I2510 Atherosclerotic heart disease of native coronary artery without angina pectoris: Secondary | ICD-10-CM | POA: Diagnosis not present

## 2012-11-25 DIAGNOSIS — I2589 Other forms of chronic ischemic heart disease: Secondary | ICD-10-CM | POA: Diagnosis not present

## 2012-11-25 HISTORY — PX: LEFT HEART CATHETERIZATION WITH CORONARY ANGIOGRAM: SHX5451

## 2012-11-25 LAB — ICD DEVICE OBSERVATION
AL AMPLITUDE: 5.7 mv
AL IMPEDENCE ICD: 686 Ohm
AL THRESHOLD: 0.9 V
DEV-0020ICD: NEGATIVE
DEVICE MODEL ICD: 164892
RV LEAD AMPLITUDE: 23.6 mv
TZON-0003FASTVT: 375 ms
VENTRICULAR PACING ICD: 1 pct

## 2012-11-25 LAB — CBC
HCT: 39.3 % (ref 39.0–52.0)
Hemoglobin: 13.8 g/dL (ref 13.0–17.0)
MCHC: 35.1 g/dL (ref 30.0–36.0)
MCV: 91.8 fL (ref 78.0–100.0)
RDW: 13.7 % (ref 11.5–15.5)

## 2012-11-25 LAB — BASIC METABOLIC PANEL
BUN: 11 mg/dL (ref 6–23)
Chloride: 102 mEq/L (ref 96–112)
Creatinine, Ser: 0.98 mg/dL (ref 0.50–1.35)
GFR calc Af Amer: >90 mL/min (ref >90)
GFR calc non Af Amer: 79 mL/min — ABNORMAL LOW (ref >90)
Glucose, Bld: 100 mg/dL — ABNORMAL HIGH (ref 70–99)

## 2012-11-25 LAB — TROPONIN I
Troponin I: <0.3 ng/mL (ref <0.30)
Troponin I: <0.3 ng/mL (ref <0.30)
Troponin I: <0.3 ng/mL (ref <0.30)

## 2012-11-25 LAB — MAGNESIUM: Magnesium: 1.9 mg/dL (ref 1.5–2.5)

## 2012-11-25 LAB — LIPID PANEL
Cholesterol: 150 mg/dL (ref 0–200)
HDL: 43 mg/dL (ref >39)
Total CHOL/HDL Ratio: 3.5 RATIO
VLDL: 20 mg/dL (ref 0–40)

## 2012-11-25 SURGERY — LEFT HEART CATHETERIZATION WITH CORONARY ANGIOGRAM
Anesthesia: LOCAL

## 2012-11-25 MED ORDER — MIDAZOLAM HCL 2 MG/2ML IJ SOLN
INTRAMUSCULAR | Status: AC
  Filled 2012-11-25: qty 2

## 2012-11-25 MED ORDER — PANTOPRAZOLE SODIUM 40 MG PO TBEC: 40.0000 mg | DELAYED_RELEASE_TABLET | Freq: Every day | ORAL | Status: DC

## 2012-11-25 MED ORDER — HEPARIN (PORCINE) IN NACL 2-0.9 UNIT/ML-% IJ SOLN
INTRAMUSCULAR | Status: AC
  Filled 2012-11-25: qty 1000

## 2012-11-25 MED ORDER — NITROGLYCERIN 0.2 MG/ML ON CALL CATH LAB
INTRAVENOUS | Status: AC
  Filled 2012-11-25: qty 1

## 2012-11-25 MED ORDER — NITROGLYCERIN 0.4 MG SL SUBL: 0.4000 mg | SUBLINGUAL_TABLET | SUBLINGUAL | Status: DC | PRN

## 2012-11-25 MED ORDER — SODIUM CHLORIDE 0.9 % IV SOLN: 250.0000 mL | INTRAVENOUS | Status: DC | PRN

## 2012-11-25 MED ORDER — CARVEDILOL 12.5 MG PO TABS
12.5000 mg | ORAL_TABLET | Freq: Two times a day (BID) | ORAL | Status: DC
  Administered 2012-11-25 (×2): 12.5 mg via ORAL
  Filled 2012-11-25 (×3): qty 1

## 2012-11-25 MED ORDER — ATORVASTATIN CALCIUM 80 MG PO TABS
80.0000 mg | ORAL_TABLET | Freq: Every day | ORAL | Status: DC
  Filled 2012-11-25: qty 1

## 2012-11-25 MED ORDER — AMIODARONE HCL 200 MG PO TABS
200.0000 mg | ORAL_TABLET | Freq: Every day | ORAL | Status: DC
  Administered 2012-11-25: 200 mg via ORAL
  Filled 2012-11-25: qty 1

## 2012-11-25 MED ORDER — SODIUM CHLORIDE 0.9 % IJ SOLN: 3.0000 mL | Freq: Two times a day (BID) | INTRAMUSCULAR | Status: DC

## 2012-11-25 MED ORDER — ACETAMINOPHEN 325 MG PO TABS: 650.0000 mg | ORAL_TABLET | ORAL | Status: DC | PRN

## 2012-11-25 MED ORDER — ASPIRIN EC 81 MG PO TBEC: 81.0000 mg | DELAYED_RELEASE_TABLET | Freq: Every day | ORAL | Status: DC

## 2012-11-25 MED ORDER — SODIUM CHLORIDE 0.9 % IJ SOLN: 3.0000 mL | INTRAMUSCULAR | Status: DC | PRN

## 2012-11-25 MED ORDER — BENAZEPRIL HCL 20 MG PO TABS
20.0000 mg | ORAL_TABLET | Freq: Every day | ORAL | Status: DC
  Administered 2012-11-25: 20 mg via ORAL
  Filled 2012-11-25: qty 1

## 2012-11-25 MED ORDER — MOMETASONE FURO-FORMOTEROL FUM 100-5 MCG/ACT IN AERO
2.0000 | INHALATION_SPRAY | Freq: Two times a day (BID) | RESPIRATORY_TRACT | Status: DC
  Filled 2012-11-25: qty 8.8

## 2012-11-25 MED ORDER — IPRATROPIUM-ALBUTEROL 20-100 MCG/ACT IN AERS
1.0000 | INHALATION_SPRAY | Freq: Four times a day (QID) | RESPIRATORY_TRACT | Status: DC | PRN
  Filled 2012-11-25: qty 4

## 2012-11-25 MED ORDER — FENOFIBRATE 160 MG PO TABS
160.0000 mg | ORAL_TABLET | Freq: Every day | ORAL | Status: DC
  Administered 2012-11-25: 160 mg via ORAL
  Filled 2012-11-25: qty 1

## 2012-11-25 MED ORDER — ONDANSETRON HCL 4 MG/2ML IJ SOLN: 4.0000 mg | Freq: Four times a day (QID) | INTRAMUSCULAR | Status: DC | PRN

## 2012-11-25 MED ORDER — SODIUM CHLORIDE 0.9 % IV SOLN: INTRAVENOUS | Status: DC

## 2012-11-25 MED ORDER — SPIRONOLACTONE 25 MG PO TABS
25.0000 mg | ORAL_TABLET | Freq: Every day | ORAL | Status: DC
  Filled 2012-11-25: qty 1

## 2012-11-25 MED ORDER — HEPARIN SODIUM (PORCINE) 5000 UNIT/ML IJ SOLN
5000.0000 [IU] | Freq: Three times a day (TID) | INTRAMUSCULAR | Status: DC
  Administered 2012-11-25: 5000 [IU] via SUBCUTANEOUS
  Filled 2012-11-25 (×4): qty 1

## 2012-11-25 MED ORDER — BUSPIRONE HCL 15 MG PO TABS
15.0000 mg | ORAL_TABLET | Freq: Two times a day (BID) | ORAL | Status: DC
  Administered 2012-11-25: 15 mg via ORAL
  Filled 2012-11-25 (×3): qty 1

## 2012-11-25 MED ORDER — SODIUM CHLORIDE 0.9 % IJ SOLN
3.0000 mL | Freq: Two times a day (BID) | INTRAMUSCULAR | Status: DC
  Administered 2012-11-25: 3 mL via INTRAVENOUS

## 2012-11-25 MED ORDER — SODIUM CHLORIDE 0.9 % IV SOLN
INTRAVENOUS | Status: DC
  Administered 2012-11-25: 10:00:00 via INTRAVENOUS

## 2012-11-25 MED ORDER — LIDOCAINE HCL (PF) 1 % IJ SOLN
INTRAMUSCULAR | Status: AC
  Filled 2012-11-25: qty 30

## 2012-11-25 MED ORDER — HEPARIN SODIUM (PORCINE) 1000 UNIT/ML IJ SOLN
INTRAMUSCULAR | Status: AC
  Filled 2012-11-25: qty 1

## 2012-11-25 MED ORDER — TAMSULOSIN HCL 0.4 MG PO CAPS
0.4000 mg | ORAL_CAPSULE | Freq: Every day | ORAL | Status: DC
  Filled 2012-11-25: qty 1

## 2012-11-25 MED ORDER — FENTANYL CITRATE 0.05 MG/ML IJ SOLN
INTRAMUSCULAR | Status: AC
  Filled 2012-11-25: qty 2

## 2012-11-25 MED ORDER — PAROXETINE HCL 10 MG PO TABS
10.0000 mg | ORAL_TABLET | ORAL | Status: DC
  Filled 2012-11-25: qty 1

## 2012-11-25 MED ORDER — PAROXETINE HCL 20 MG PO TABS
40.0000 mg | ORAL_TABLET | ORAL | Status: DC
  Filled 2012-11-25: qty 2

## 2012-11-25 MED ORDER — VERAPAMIL HCL 2.5 MG/ML IV SOLN
INTRAVENOUS | Status: AC
  Filled 2012-11-25: qty 2

## 2012-11-25 MED ORDER — PAROXETINE HCL 20 MG PO TABS
40.0000 mg | ORAL_TABLET | Freq: Every day | ORAL | Status: DC
  Administered 2012-11-25: 40 mg via ORAL
  Filled 2012-11-25: qty 2

## 2012-11-25 MED ORDER — HEPARIN (PORCINE) IN NACL 2-0.9 UNIT/ML-% IJ SOLN
INTRAMUSCULAR | Status: AC
  Filled 2012-11-25: qty 500

## 2012-11-25 NOTE — CV Procedure (Signed)
     Diagnostic Cardiac Catheterization with Coronary Angiography Report  [name]  72 y.o.  male Jul 29, 1940  Procedure Date: 11/25/2012  Referring Physician: Berton Mount, M.D. Primary Cardiologist:: HWB Leia Alf, M.D.   PROCEDURE:  Left heart catheterization with selective coronary angiography, left ventriculogram.  INDICATIONS:  Angina occurring at rest on 2 occasions within the past 7 days. Complicated prior cardiac history with large inferior MI 1995 treated with PCI. Subsequent LAD stenting. LVEF less than 35%. AICD implantation in [place] 2010. Sustained ventricular tachycardia was indication .  The risks, benefits, and details of the procedure were explained to the patient.  The patient verbalized understanding and wanted to proceed.  Informed written consent was obtained.  PROCEDURE TECHNIQUE:  After Xylocaine anesthesia a 5 French sheath was placed in the right radial artery with a single anterior needle wall stick.   Coronary angiography was done using a 5 French A2 MP and 3.5 cm JL catheter.  Left ventriculography was done using a 5 [place] A2 MP catheter.    CONTRAST:  Total of 85 cc.  COMPLICATIONS:  None.    HEMODYNAMICS:  Aortic pressure was 104/58 mmHg; LV pressure was 108/15 mmHg; LVEDP 18 mm mercury.  There was no gradient between the left ventricle and aorta.    ANGIOGRAPHIC DATA:   The left main coronary artery is mid to distal less than 30% narrowing. Distal calcifications..  The left anterior descending artery is proximal stent is widely patent. The LAD beyond the stent is moderately diseased with diathesis luminal irregularities but no high-grade obstruction..  The left circumflex artery is a large vessel that supplies 2 obtuse marginals. Eccentric 50% stenosis is noted after the first obtuse marginal.  The right coronary artery is diffusely disease. Stents are noted in the proximal to mid vessel as well as the distal RCA near the PDA. Irregularities  up to 30-50% and noted throughout. The PDA contains eccentric 80-90% obstruction. The PDA is small. The right coronary territory is a site of myocardial infarction 19 years ago. The inferior wall is akinetic.[name]  LEFT VENTRICULOGRAM:  Left ventricular angiogram was done in the 30 RAO projection and revealed a dilated cavity with inferior akinesis and ejection fraction of 30% %.  LVEDP was 18 mmHg.  IMPRESSIONS:  1. Patent LAD and right coronary stents.  2. Ostial PDA with 80-90% obstruction. 50% proximal circumflex after the first obtuse marginal. That's use LAD disease without focal high-grade stenosis in the proximal to mid LAD.  3. Dilated left ventricle with EF 30%, inferior akinesis with mild hypokinesis of all the walls. No MR is noted   RECOMMENDATION:  Angina may be coming from the PDA or due to vasovagal constriction in other territories. For now sublingual nitroglycerin should be used. A followup clinically in 7-10 days . Discharged later this evening.

## 2012-11-25 NOTE — Care Management Note (Addendum)
    Page 1 of 1   11/25/2012     2:22:07 PM   CARE MANAGEMENT NOTE 11/25/2012  Patient:  Steven Guzman, Steven Guzman   Account Number:  [id number]  Date Initiated:  11/25/2012  Documentation initiated by:  GRAVES-BIGELOW,Nomar Broad  Subjective/Objective Assessment:   Pt admitted with cp. Cath done and Patent LAD and right coronary stents. Plan for d/c home. No needs at this time.     Action/Plan:   Anticipated DC Date:  11/25/2012   Anticipated DC Plan:  HOME/SELF CARE      DC Planning Services  CM consult      Choice offered to / List presented to:             Status of service:  Completed, signed off Medicare Important Message given?   (If response is "NO", the following Medicare IM given date fields will be blank) Date Medicare IM given:   Date Additional Medicare IM given:    Discharge Disposition:  HOME/SELF CARE  Per UR Regulation:  Reviewed for med. necessity/level of care/duration of stay  If discussed at Long Length of Stay Meetings, dates discussed:    Comments:

## 2012-11-25 NOTE — Progress Notes (Signed)
ICD check by industry  For a  patient new to our practice.  He will establish with Dr. Ladona Ridgel in December.

## 2012-11-25 NOTE — Progress Notes (Addendum)
He has recurrent angina and has stents in RCA and other vessels. Has had 2 episodes of angina at rest over the past 4 days. Plan diagnostic cath today id schedule allows.  Coronary angiography and possible PCI indications an approach were discussed in detail. Risks including stroke, death, myocardial infarction, allergy, contrast injury affecting the kidneys, limb ischemia, among others were discussed in detail and accepted by the patient.  30 minutes spent with the patient discussing rationale and also reviewing the chart.

## 2012-11-25 NOTE — H&P (Signed)
Cardiology History and Physical  Darnelle Bos, MD  History of Present Illness (and review of medical records): Steven Guzman is a 72 y.o. male who presents for evaluation of recurrent chest pain.  He has hx of CAD.  Patient is status post myocardial infarction in 1994 complicated by cardiac arrest. He then had PCI. He was followed by Dr. Katrinka Blazing but then moved to [place]. In 2011 the patient had a myocardial infarction by his report and sounds to also have had ventricular tachycardia at the time of presentation. He had PCI and then what sounds to be an EP study and was found to be inducible. An ICD was implanted and he ultimately was started on amiodarone as well.  Records are not available at this time.  He recently moved back to this area in July. He was seen in [facility] cardiology clinic 8/27.  He reports two episodes of chest pain.  The first was last weekend when he presented to [facility].  Pain was at rest and was relieved in minutes after taking 2 ASA.  He was evaluated and d/c home.  He now presents with chest pain that began around 730pm at rest.  Pain was severe across his chest similar to prior episode.  Pain was rated 7-8/10.  He had some left arm discomfort.  He took one NTG and pain was relieved again in minutes.  He then came to the ED for further evaluation.  He remains chest pain free.  Review of Systems Further review of systems was otherwise negative other than stated in HPI.  Patient Active Problem List   Diagnosis Date Noted  . CAD (coronary artery disease) 11/23/2012  . Cardiomyopathy, ischemic 11/23/2012  . Tobacco abuse 11/23/2012  . Ventricular tachycardia 11/23/2012  . Essential hypertension 11/23/2012  . Hyperlipidemia 11/23/2012  . Chest pain 11/23/2012  . ICD (implantable cardioverter-defibrillator) in place 11/23/2012   Past Medical History  Diagnosis Date  . Hypertension   . Hyperlipidemia   . COPD (chronic obstructive pulmonary disease)   . GERD  (gastroesophageal reflux disease)   . Diverticulitis   . CAD (coronary artery disease)   . ICD (implantable cardioverter-defibrillator) battery depletion     Past Surgical History  Procedure Laterality Date  . Hernia repair    . Foot surgery    . Hemorrhoid banding    . Retinal detachment surgery    . Angioplasty       (Not in a hospital admission) Allergies  Allergen Reactions  . Sulfa Antibiotics     History  Substance Use Topics  . Smoking status: Current Every Day Smoker  . Smokeless tobacco: Not on file  . Alcohol Use: Yes    Family History  Problem Relation Age of Onset  . Heart attack Brother   . CAD Father   . CAD Mother      Objective:  Patient Vitals for the past 8 hrs:  BP Temp Temp src Pulse Resp SpO2  11/25/12 0057 106/78 mmHg - - 60 21 91 %  11/25/12 0003 144/79 mmHg - - 59 - 95 %  11/24/12 2334 - - - 60 14 98 %  11/24/12 2028 103/70 mmHg 98.2 F (36.8 C) Oral 60 20 98 %   General appearance: alert, cooperative, appears stated age and no distress Head: Normocephalic, without obvious abnormality, atraumatic Eyes: conjunctivae/corneas clear. PERRL, EOM's intact. Fundi benign. Neck: no carotid bruit, no JVD and supple, symmetrical, trachea midline Lungs: clear to auscultation bilaterally Chest wall: no  tenderness Heart: regular rate and rhythm, S1, S2 normal, no murmur, click, rub or gallop Abdomen: soft, non-tender; bowel sounds normal; no masses,  no organomegaly Extremities: extremities normal, atraumatic, no cyanosis or edema Pulses: 2+ and symmetric Neurologic: Grossly normal  Results for orders placed during the hospital encounter of 11/24/12 (from the past 48 hour(s))  CBC     Status: None   Collection Time    11/24/12  8:37 PM      Result Value Range   WBC 8.0  4.0 - 10.5 K/uL   RBC 4.65  4.22 - 5.81 MIL/uL   Hemoglobin 14.9  13.0 - 17.0 g/dL   HCT 41.3  24.4 - 01.0 %   MCV 92.7  78.0 - 100.0 fL   MCH 32.0  26.0 - 34.0 pg   MCHC 34.6   30.0 - 36.0 g/dL   RDW 27.2  53.6 - 64.4 %   Platelets 219  150 - 400 K/uL  BASIC METABOLIC PANEL     Status: Abnormal   Collection Time    11/24/12  8:37 PM      Result Value Range   Sodium 136  135 - 145 mEq/L   Potassium 4.4  3.5 - 5.1 mEq/L   Chloride 100  96 - 112 mEq/L   CO2 26  19 - 32 mEq/L   Glucose, Bld 86  70 - 99 mg/dL   BUN 11  6 - 23 mg/dL   Creatinine, Ser 0.34  0.50 - 1.35 mg/dL   Calcium 9.6  8.4 - 74.2 mg/dL   GFR calc non Af Amer 72 (*) >90 mL/min   GFR calc Af Amer 83 (*) >90 mL/min   Comment: (NOTE)     The eGFR has been calculated using the CKD EPI equation.     This calculation has not been validated in all clinical situations.     eGFR's persistently <90 mL/min signify possible Chronic Kidney     Disease.  POCT I-STAT TROPONIN I     Status: None   Collection Time    11/24/12  8:42 PM      Result Value Range   Troponin i, poc 0.00  0.00 - 0.08 ng/mL   Comment 3            Comment: Due to the release kinetics of cTnI,     a negative result within the first hours     of the onset of symptoms does not rule out     myocardial infarction with certainty.     If myocardial infarction is still suspected,     repeat the test at appropriate intervals.  TROPONIN I     Status: None   Collection Time    11/24/12 11:13 PM      Result Value Range   Troponin I <0.30  <0.30 ng/mL   Comment:            Due to the release kinetics of cTnI,     a negative result within the first hours     of the onset of symptoms does not rule out     myocardial infarction with certainty.     If myocardial infarction is still suspected,     repeat the test at appropriate intervals.   Dg Chest 2 View  11/24/2012   *RADIOLOGY REPORT*  Clinical Data: Chest pain  CHEST - 2 VIEW  Comparison: 09/25/2005 abdominal CT  Findings: Normal heart size.  Mild aortic tortuosity.  Left chest wall battery pack with lead tips projecting over the expected location of the right atrium and right  ventricle.  Mild interstitial coarsening and hyperinflation.  No focal consolidation.  No pleural effusion or pneumothorax. Chronic mild elevation of the left hemidiaphragm.  No acute osseous finding.  IMPRESSION: COPD.  No acute process identified.   Original Report Authenticated By: Jearld Lesch, M.D.    ECG:  Atrial paced rhythm 60 BPM, baseline artifact  Assessment: 56M hx of CAD, VT s/p ICD, presents with recurrent chest pain.  No acute changes on ecg and initial biomarkers negative.  Plan:  1. Cardiology Admission  2. Continuous monitoring on Telemetry. 3. Repeat ekg on admit, prn chest pain or arrythmia 4. Trend cardiac biomarkers, check lipids, hgba1c, tsh 5. Medical management to include ASA,BB, ACEI, Statin, NTG prn 6. Continue Amiodarone. 7. Will keep NPO for further ischemic evaluation in am. 8. Discussed plan with patient and wife at beside.

## 2012-11-25 NOTE — Discharge Summary (Signed)
Patient ID: Steven Guzman MRN: [id number] DOB/AGE: [birth year] 74 y.o.  Admit date: 11/24/2012 Discharge date: 11/25/2012  Primary Discharge Diagnosis : Chest pain Secondary Discharge Diagnosis : Coronary artery disease with LAD and right coronary stents  Prior large inferior myocardial infarction 1995  Ischemic cardiomyopathy, EF less than 35%  AICD, 2010 due to ischemic cardiomyopathy  Significant Diagnostic Studies: Diagnostic coronary angiography  Consults: Electrophysiology  Hospital Course: The patient had an episode of angina relieved by nitroglycerin. The entire episode lasted less than 15 minutes. This was a second such episode within 7 days. He had been hospitalized at [facility] for similar complaints one week ago. Diagnostic coronary angiography revealed widely patent proximal coronary segments. The PDA of the right coronary contained significant ostial narrowing but with a small distal distribution. It was felt that medical therapy would be the best approach.  The procedure was performed from the radial approach. After ambulation the patient was felt stable for discharge.   Discharge Exam: Blood pressure 115/67, pulse 61, temperature 94.8 F (34.9 C), temperature source Oral, resp. rate 18, height 6' (1.829 m), weight 90.447 kg (199 lb 6.4 oz), SpO2 93.00%.    Right radial cath site is unremarkable Labs:   Lab Results  Component Value Date   WBC 7.2 11/25/2012   HGB 13.8 11/25/2012   HCT 39.3 11/25/2012   MCV 91.8 11/25/2012   PLT 178 11/25/2012    Recent Labs Lab 11/25/12 0330  NA 136  K 3.8  CL 102  CO2 26  BUN 11  CREATININE 0.98  CALCIUM 9.3  GLUCOSE 100*   Lab Results  Component Value Date   TROPONINI <0.30 11/25/2012    Lab Results  Component Value Date   CHOL 150 11/25/2012   Lab Results  Component Value Date   HDL 43 11/25/2012   Lab Results  Component Value Date   LDLCALC 87 11/25/2012   Lab Results  Component Value  Date   TRIG 98 11/25/2012   Lab Results  Component Value Date   CHOLHDL 3.5 11/25/2012   No results found for this basename: LDLDIRECT      Radiology: COPD noted on chest x-ray EKG: No acute changes noted  FOLLOW UP PLANS AND APPOINTMENTS  Future Appointments Provider Department Dept Phone   02/27/2013 3:00 PM Marinus Maw, MD [facility] Main Office [place]) [phone]       Medication List         amiodarone 200 MG tablet  Commonly known as:  PACERONE  Take 200 mg by mouth daily.     aspirin 81 MG tablet  Take 81 mg by mouth daily.     atorvastatin 80 MG tablet  Commonly known as:  LIPITOR  Take 80 mg by mouth daily.     benazepril 40 MG tablet  Commonly known as:  LOTENSIN  Take 20 mg by mouth daily.     busPIRone 15 MG tablet  Commonly known as:  BUSPAR  Take 15 mg by mouth 2 (two) times daily.     carvedilol 12.5 MG tablet  Commonly known as:  COREG  Take 12.5 mg by mouth 2 (two) times daily with a meal.     COMBIVENT RESPIMAT 20-100 MCG/ACT Aers respimat  Generic drug:  Ipratropium-Albuterol  Inhale into the lungs as directed.     Fluticasone-Salmeterol 250-50 MCG/DOSE Aepb  Commonly known as:  ADVAIR  Inhale 1 puff into the lungs every 12 (twelve) hours.  nitroGLYCERIN 0.4 MG SL tablet  Commonly known as:  NITROSTAT  Place 1 tablet (0.4 mg total) under the tongue every 5 (five) minutes as needed for chest pain.     omeprazole 20 MG capsule  Commonly known as:  PRILOSEC  Take 20 mg by mouth 2 (two) times daily.     PARoxetine 40 MG tablet  Commonly known as:  PAXIL  Take 40 mg by mouth every morning.     PARoxetine 10 MG tablet  Commonly known as:  PAXIL  Take 10 mg by mouth every morning.     spironolactone 25 MG tablet  Commonly known as:  ALDACTONE  Take 25 mg by mouth daily.     tamsulosin 0.4 MG Caps capsule  Commonly known as:  FLOMAX  Take by mouth.     TRILIPIX 135 MG capsule  Generic drug:  Choline Fenofibrate    Take 135 mg by mouth daily.           Follow-up Information   Follow up with Lesleigh Noe, MD On 12/06/2012. (1130A)    Specialty:  Cardiology   Contact information:   [address] [place] [address] [phone]       BRING ALL MEDICATIONS WITH YOU TO FOLLOW UP APPOINTMENTS  Time spent with patient to include physician time: 20 minutes Signed: Lesleigh Noe 11/25/2012, 4:40 PM

## 2012-11-25 NOTE — ED Notes (Signed)
Cardiology MD at bedside.

## 2012-11-25 NOTE — ED Notes (Signed)
Otter, MD at bedside.  

## 2012-12-06 DIAGNOSIS — E785 Hyperlipidemia, unspecified: Secondary | ICD-10-CM | POA: Diagnosis not present

## 2012-12-06 DIAGNOSIS — Z79899 Other long term (current) drug therapy: Secondary | ICD-10-CM | POA: Diagnosis not present

## 2012-12-06 DIAGNOSIS — I10 Essential (primary) hypertension: Secondary | ICD-10-CM | POA: Diagnosis not present

## 2012-12-06 DIAGNOSIS — I472 Ventricular tachycardia: Secondary | ICD-10-CM | POA: Diagnosis not present

## 2012-12-06 DIAGNOSIS — I5022 Chronic systolic (congestive) heart failure: Secondary | ICD-10-CM | POA: Diagnosis not present

## 2012-12-06 DIAGNOSIS — Z95810 Presence of automatic (implantable) cardiac defibrillator: Secondary | ICD-10-CM | POA: Diagnosis not present

## 2012-12-06 DIAGNOSIS — I2510 Atherosclerotic heart disease of native coronary artery without angina pectoris: Secondary | ICD-10-CM | POA: Diagnosis not present

## 2012-12-06 DIAGNOSIS — J449 Chronic obstructive pulmonary disease, unspecified: Secondary | ICD-10-CM | POA: Diagnosis not present

## 2012-12-19 ENCOUNTER — Emergency Department (INDEPENDENT_AMBULATORY_CARE_PROVIDER_SITE_OTHER)
Admission: EM | Admit: 2012-12-19 | Discharge: 2012-12-19 | Disposition: A | Discharge status: Home / Self Care | Payer: Medicare Other | Source: Home / Self Care | Attending: Family Medicine | Admitting: Family Medicine

## 2012-12-19 ENCOUNTER — Encounter (HOSPITAL_COMMUNITY): Payer: Self-pay | Admitting: Emergency Medicine

## 2012-12-19 DIAGNOSIS — R229 Localized swelling, mass and lump, unspecified: Secondary | ICD-10-CM

## 2012-12-19 NOTE — ED Provider Notes (Signed)
CSN: [id number]     Arrival date & time 12/19/12  1729 History   First MD Initiated Contact with Patient 12/19/12 1851     Chief Complaint  Patient presents with  . Leg Swelling    right lower localized swelling above ankle. lateral side.    (Consider location/radiation/quality/duration/timing/severity/associated sxs/prior Treatment) HPI Comments: There is an area on the R medial lower leg of swelling and redness. Minimal pain in this area, pt describes it as feeling "tight".  Several inches superior to this, pt c/o pain/soreness and new spider veins.  Both of these sx appeared at the same time. Nothing makes them worse or better, except pushing on spider vein area increases soreness. Denies fever, chills. Has been elevating lower leg and wearing support stockings but these measures have not helped. Pt is worried has a blood clot.   The history is provided by the patient.    Past Medical History  Diagnosis Date  . Hypertension   . Hyperlipidemia   . COPD (chronic obstructive pulmonary disease)   . GERD (gastroesophageal reflux disease)   . Diverticulitis   . CAD (coronary artery disease)   . ICD (implantable cardioverter-defibrillator) battery depletion   . Myocardial infarction   . Automatic implantable cardioverter-defibrillator in situ   . Depression   . Aneurysm     abdominal <4   Past Surgical History  Procedure Laterality Date  . Hernia repair    . Foot surgery    . Hemorrhoid banding    . Retinal detachment surgery    . Angioplasty    . Cardiac catheterization    . Nose surgery      skin cancer   Family History  Problem Relation Age of Onset  . Heart attack Brother   . CAD Father   . CAD Mother    History  Substance Use Topics  . Smoking status: Current Every Day Smoker -- 1.00 packs/day    Types: Cigarettes  . Smokeless tobacco: Not on file  . Alcohol Use: Yes     Comment: occasionally     Review of Systems  Constitutional: Negative for fever and  chills.  Musculoskeletal:       Lower leg R swelling and redness; slight soreness.     Allergies  Sulfa antibiotics  Home Medications   Current Outpatient Rx  Name  Route  Sig  Dispense  Refill  . amiodarone (PACERONE) 200 MG tablet   Oral   Take 200 mg by mouth daily.         [name] aspirin 81 MG tablet   Oral   Take 81 mg by mouth daily.         [name] atorvastatin (LIPITOR) 80 MG tablet   Oral   Take 80 mg by mouth daily.         . benazepril (LOTENSIN) 40 MG tablet   Oral   Take 20 mg by mouth daily.          . busPIRone (BUSPAR) 15 MG tablet   Oral   Take 15 mg by mouth 2 (two) times daily.         . carvedilol (COREG) 12.5 MG tablet   Oral   Take 12.5 mg by mouth 2 (two) times daily with a meal.         . Choline Fenofibrate (TRILIPIX) 135 MG capsule   Oral   Take 135 mg by mouth daily.         . Fluticasone-Salmeterol (ADVAIR)  250-50 MCG/DOSE AEPB   Inhalation   Inhale 1 puff into the lungs every 12 (twelve) hours.         . Ipratropium-Albuterol (COMBIVENT RESPIMAT) 20-100 MCG/ACT AERS respimat   Inhalation   Inhale into the lungs as directed.         . nitroGLYCERIN (NITROSTAT) 0.4 MG SL tablet   Sublingual   Place 1 tablet (0.4 mg total) under the tongue every 5 (five) minutes as needed for chest pain.   25 tablet   12   . omeprazole (PRILOSEC) 20 MG capsule   Oral   Take 20 mg by mouth 2 (two) times daily.         [name] PARoxetine (PAXIL) 10 MG tablet   Oral   Take 10 mg by mouth every morning.         [name] PARoxetine (PAXIL) 40 MG tablet   Oral   Take 40 mg by mouth every morning.         [name] spironolactone (ALDACTONE) 25 MG tablet   Oral   Take 25 mg by mouth daily.         . tamsulosin (FLOMAX) 0.4 MG CAPS capsule   Oral   Take by mouth.          BP 108/70  Pulse 59  Temp(Src) 97.9 F (36.6 C) (Oral)  Resp 16  SpO2 97% Physical Exam  Constitutional: He is oriented to person, place, and time. He appears  well-developed and well-nourished. No distress.  Neurological: He is alert and oriented to person, place, and time. Gait normal.  Skin: Skin is warm and dry.  Spider veins medial R lower leg are new per pt; area is mildly tender with palpation. approx 16cm below this is an area of swelling and erythema, approx 4cm in diameter. Non tender to palp, no warmth.     ED Course  Procedures (including critical care time) Labs Review Labs Reviewed - No data to display Imaging Review No results found.  MDM   1. Soft tissue swelling   It appears that some sort of event, such as breaking of superficial blood vessel, insect sting, contusion, occurred on superior aspect of L medial lower leg, and this is area of tenderness.  Swelling resulting from event seems to have drained from site of event to an area inferior, and this is the area of mild swelling and erythema. Pt to f/u with pcp if any s/sx infection occur.      Cathlyn Parsons, NP 12/19/12 1907

## 2012-12-19 NOTE — ED Notes (Signed)
Lower localized leg swelling above right ankle. Some redness. Tender to touch and pain with walking. No hx of DVT Noticed yesterday.

## 2012-12-21 NOTE — ED Provider Notes (Signed)
Medical screening examination/treatment/procedure(s) were performed by resident physician or non-physician practitioner and as supervising physician I was immediately available for consultation/collaboration.   KINDL,JAMES DOUGLAS MD.   James D Kindl, MD 12/21/12 1521 

## 2012-12-29 ENCOUNTER — Telehealth: Payer: Self-pay | Admitting: Interventional Cardiology

## 2012-12-29 NOTE — Telephone Encounter (Signed)
New Problem  Mychart activation code has expired. Please advise on how to reactivate.

## 2012-12-30 NOTE — Telephone Encounter (Signed)
Patient has giving activation code.

## 2013-01-02 ENCOUNTER — Encounter: Payer: Self-pay | Admitting: Internal Medicine

## 2013-01-09 ENCOUNTER — Other Ambulatory Visit: Payer: Self-pay | Admitting: Internal Medicine

## 2013-01-09 DIAGNOSIS — R109 Unspecified abdominal pain: Secondary | ICD-10-CM | POA: Diagnosis not present

## 2013-01-09 DIAGNOSIS — K429 Umbilical hernia without obstruction or gangrene: Secondary | ICD-10-CM | POA: Diagnosis not present

## 2013-01-09 DIAGNOSIS — J449 Chronic obstructive pulmonary disease, unspecified: Secondary | ICD-10-CM | POA: Diagnosis not present

## 2013-01-10 ENCOUNTER — Ambulatory Visit
Admission: RE | Admit: 2013-01-10 | Discharge: 2013-01-10 | Disposition: A | Discharge status: Home / Self Care | Payer: Medicare Other | Source: Ambulatory Visit | Attending: Internal Medicine | Admitting: Internal Medicine

## 2013-01-10 DIAGNOSIS — R109 Unspecified abdominal pain: Secondary | ICD-10-CM

## 2013-01-10 DIAGNOSIS — K838 Other specified diseases of biliary tract: Secondary | ICD-10-CM | POA: Diagnosis not present

## 2013-01-13 DIAGNOSIS — M722 Plantar fascial fibromatosis: Secondary | ICD-10-CM | POA: Diagnosis not present

## 2013-01-13 DIAGNOSIS — M7150 Other bursitis, not elsewhere classified, unspecified site: Secondary | ICD-10-CM | POA: Diagnosis not present

## 2013-01-13 DIAGNOSIS — M24573 Contracture, unspecified ankle: Secondary | ICD-10-CM | POA: Diagnosis not present

## 2013-01-17 DIAGNOSIS — M531 Cervicobrachial syndrome: Secondary | ICD-10-CM | POA: Diagnosis not present

## 2013-01-17 DIAGNOSIS — J449 Chronic obstructive pulmonary disease, unspecified: Secondary | ICD-10-CM | POA: Diagnosis not present

## 2013-01-17 DIAGNOSIS — I2510 Atherosclerotic heart disease of native coronary artery without angina pectoris: Secondary | ICD-10-CM | POA: Diagnosis not present

## 2013-01-17 DIAGNOSIS — I252 Old myocardial infarction: Secondary | ICD-10-CM | POA: Diagnosis not present

## 2013-01-17 DIAGNOSIS — I5022 Chronic systolic (congestive) heart failure: Secondary | ICD-10-CM | POA: Diagnosis not present

## 2013-02-01 DIAGNOSIS — M722 Plantar fascial fibromatosis: Secondary | ICD-10-CM | POA: Diagnosis not present

## 2013-02-01 DIAGNOSIS — M7150 Other bursitis, not elsewhere classified, unspecified site: Secondary | ICD-10-CM | POA: Diagnosis not present

## 2013-02-02 ENCOUNTER — Other Ambulatory Visit: Payer: Self-pay

## 2013-02-17 ENCOUNTER — Encounter: Payer: Self-pay | Admitting: Interventional Cardiology

## 2013-02-22 ENCOUNTER — Encounter: Payer: Self-pay | Admitting: *Deleted

## 2013-02-27 ENCOUNTER — Ambulatory Visit (INDEPENDENT_AMBULATORY_CARE_PROVIDER_SITE_OTHER): Payer: Medicare Other | Admitting: Internal Medicine

## 2013-02-27 ENCOUNTER — Encounter: Payer: Self-pay | Admitting: Internal Medicine

## 2013-02-27 VITALS — BP 106/68 | HR 59 | Ht 72.0 in | Wt 208.1 lb

## 2013-02-27 DIAGNOSIS — I472 Ventricular tachycardia: Secondary | ICD-10-CM | POA: Diagnosis not present

## 2013-02-27 DIAGNOSIS — I255 Ischemic cardiomyopathy: Secondary | ICD-10-CM

## 2013-02-27 DIAGNOSIS — I2589 Other forms of chronic ischemic heart disease: Secondary | ICD-10-CM | POA: Diagnosis not present

## 2013-02-27 DIAGNOSIS — Z95810 Presence of automatic (implantable) cardiac defibrillator: Secondary | ICD-10-CM

## 2013-02-27 NOTE — Assessment & Plan Note (Signed)
His device is working normally. We'll plan to recheck in several months. 

## 2013-02-27 NOTE — Progress Notes (Signed)
HPI Steven Guzman is referred today by Dr. Jens Som for ongoing evaluation and management of his ICD and ventricular tachycardia. He is a very pleasant 72 year old man with known coronary disease, status post MI, who developed ventricular tachycardia and underwent ICD implantation several years ago. Because of recurrent ventricular tachycardia, he was placed on amiodarone therapy. Overall he feels well, and denies chest pain or shortness of breath. He has had no recent ICD shock.  Allergies  Allergen Reactions  . Sulfa Antibiotics      Current Outpatient Prescriptions  Medication Sig Dispense Refill  . amiodarone (PACERONE) 200 MG tablet Take 200 mg by mouth daily.      [name] aspirin 81 MG tablet Take 81 mg by mouth daily.      [name] atorvastatin (LIPITOR) 80 MG tablet Take 80 mg by mouth daily.      . benazepril (LOTENSIN) 10 MG tablet Take 10 mg by mouth daily.      . busPIRone (BUSPAR) 15 MG tablet Take 15 mg by mouth 2 (two) times daily.      . carvedilol (COREG) 12.5 MG tablet Take 12.5 mg by mouth 2 (two) times daily with a meal.      . Choline Fenofibrate (TRILIPIX) 135 MG capsule Take 135 mg by mouth daily.      . Fluticasone-Salmeterol (ADVAIR) 250-50 MCG/DOSE AEPB Inhale 1 puff into the lungs every 12 (twelve) hours.      . Ipratropium-Albuterol (COMBIVENT RESPIMAT) 20-100 MCG/ACT AERS respimat Inhale into the lungs as directed.      . nitroGLYCERIN (NITROSTAT) 0.4 MG SL tablet Place 1 tablet (0.4 mg total) under the tongue every 5 (five) minutes as needed for chest pain.  25 tablet  12  . Omega-3 Fatty Acids (FISH OIL) 1000 MG CAPS Take by mouth.      [name] omeprazole (PRILOSEC) 20 MG capsule Take 20 mg by mouth 2 (two) times daily.      [name] PARoxetine (PAXIL) 40 MG tablet Take 40 mg by mouth every morning.      [name] spironolactone (ALDACTONE) 25 MG tablet Take 25 mg by mouth daily.      . tamsulosin (FLOMAX) 0.4 MG CAPS capsule Take by mouth.       No current facility-administered  medications for this visit.     Past Medical History  Diagnosis Date  . Hypertension   . Hyperlipidemia   . COPD (chronic obstructive pulmonary disease)   . GERD (gastroesophageal reflux disease)   . Diverticulitis   . CAD (coronary artery disease)   . ICD (implantable cardioverter-defibrillator) battery depletion   . Myocardial infarction   . Automatic implantable cardioverter-defibrillator in situ   . Depression   . Aneurysm     abdominal <4    ROS:   All systems reviewed and negative except as noted in the HPI.   Past Surgical History  Procedure Laterality Date  . Hernia repair    . Foot surgery    . Hemorrhoid banding    . Retinal detachment surgery    . Angioplasty    . Cardiac catheterization    . Nose surgery      skin cancer     Family History  Problem Relation Age of Onset  . Heart attack Brother   . CAD Father   . CAD Mother      History   Social History  . Marital Status: Married    Spouse Name: N/A    Number  of Children: N/A  . Years of Education: N/A   Occupational History  . Not on file.   Social History Main Topics  . Smoking status: Current Every Day Smoker -- 1.00 packs/day    Types: Cigarettes  . Smokeless tobacco: Not on file  . Alcohol Use: Yes     Comment: occasionally   . Drug Use: No  . Sexual Activity: Not Currently   Other Topics Concern  . Not on file   Social History Narrative  . No narrative on file     BP 106/68  Pulse 59  Ht 6' (1.829 m)  Wt 208 lb 1.9 oz (94.403 kg)  BMI 28.22 kg/m2  Physical Exam:  Well appearing 72 year old man, NAD HEENT: Unremarkable Neck:  No JVD, no thyromegally Back:  No CVA tenderness Lungs:  Clear with no wheezes, rales, or rhonchi. HEART:  Regular rate rhythm, no murmurs, no rubs, no clicks Abd:  soft, positive bowel sounds, no organomegally, no rebound, no guarding Ext:  2 plus pulses, no edema, no cyanosis, no clubbing Skin:  No rashes no nodules Neuro:  CN II through  XII intact, motor grossly intact  DEVICE  Normal device function.  See PaceArt for details.   Assess/Plan:

## 2013-02-27 NOTE — Patient Instructions (Signed)
Your physician recommends that you continue on your current medications as directed. Please refer to the Current Medication list given to you today.  Your physician recommends that you schedule a follow-up appointment in: 3 months with device clinic.    Your physician wants you to follow-up in: one year with Dr. Ladona Ridgel.  You will receive a reminder letter in the mail two months in advance. If you don't receive a letter, please call our office to schedule the follow-up appointment.

## 2013-02-27 NOTE — Assessment & Plan Note (Signed)
He continues to have episodes of ventricular tachycardia which have been terminated with anti-tachycardic pacing therapy. He will continue low-dose amiodarone. I would consider increasing his dose if his ventricular tachycardia worsens.

## 2013-02-28 LAB — MDC_IDC_ENUM_SESS_TYPE_INCLINIC
HighPow Impedance: 46 Ohm
HighPow Impedance: 55 Ohm
Implantable Pulse Generator Serial Number: 164892
Lead Channel Impedance Value: 654 Ohm
Lead Channel Pacing Threshold Amplitude: 1 V
Lead Channel Sensing Intrinsic Amplitude: 24.9 mV
Lead Channel Sensing Intrinsic Amplitude: 3.5 mV
Lead Channel Setting Pacing Amplitude: 2 V
Lead Channel Setting Sensing Sensitivity: 0.6 mV
Zone Setting Detection Interval: 375 ms

## 2013-03-07 DIAGNOSIS — R109 Unspecified abdominal pain: Secondary | ICD-10-CM | POA: Diagnosis not present

## 2013-03-07 DIAGNOSIS — F17200 Nicotine dependence, unspecified, uncomplicated: Secondary | ICD-10-CM | POA: Diagnosis not present

## 2013-03-07 DIAGNOSIS — E785 Hyperlipidemia, unspecified: Secondary | ICD-10-CM | POA: Diagnosis not present

## 2013-03-07 DIAGNOSIS — R160 Hepatomegaly, not elsewhere classified: Secondary | ICD-10-CM | POA: Diagnosis not present

## 2013-03-07 DIAGNOSIS — I10 Essential (primary) hypertension: Secondary | ICD-10-CM | POA: Diagnosis not present

## 2013-03-07 DIAGNOSIS — J449 Chronic obstructive pulmonary disease, unspecified: Secondary | ICD-10-CM | POA: Diagnosis not present

## 2013-03-07 DIAGNOSIS — K7689 Other specified diseases of liver: Secondary | ICD-10-CM | POA: Diagnosis not present

## 2013-03-07 DIAGNOSIS — I252 Old myocardial infarction: Secondary | ICD-10-CM | POA: Diagnosis not present

## 2013-04-18 DIAGNOSIS — I10 Essential (primary) hypertension: Secondary | ICD-10-CM | POA: Diagnosis not present

## 2013-04-18 DIAGNOSIS — M531 Cervicobrachial syndrome: Secondary | ICD-10-CM | POA: Diagnosis not present

## 2013-04-18 DIAGNOSIS — I252 Old myocardial infarction: Secondary | ICD-10-CM | POA: Diagnosis not present

## 2013-04-18 DIAGNOSIS — Z23 Encounter for immunization: Secondary | ICD-10-CM | POA: Diagnosis not present

## 2013-04-18 DIAGNOSIS — F17200 Nicotine dependence, unspecified, uncomplicated: Secondary | ICD-10-CM | POA: Diagnosis not present

## 2013-04-18 DIAGNOSIS — J449 Chronic obstructive pulmonary disease, unspecified: Secondary | ICD-10-CM | POA: Diagnosis not present

## 2013-04-18 DIAGNOSIS — I5022 Chronic systolic (congestive) heart failure: Secondary | ICD-10-CM | POA: Diagnosis not present

## 2013-04-18 DIAGNOSIS — E538 Deficiency of other specified B group vitamins: Secondary | ICD-10-CM | POA: Diagnosis not present

## 2013-06-01 ENCOUNTER — Encounter: Payer: Self-pay | Admitting: Internal Medicine

## 2013-06-01 ENCOUNTER — Ambulatory Visit (INDEPENDENT_AMBULATORY_CARE_PROVIDER_SITE_OTHER): Payer: Medicare Other | Admitting: *Deleted

## 2013-06-01 DIAGNOSIS — I472 Ventricular tachycardia, unspecified: Secondary | ICD-10-CM

## 2013-06-01 DIAGNOSIS — I4729 Other ventricular tachycardia: Secondary | ICD-10-CM | POA: Diagnosis not present

## 2013-06-01 DIAGNOSIS — I255 Ischemic cardiomyopathy: Secondary | ICD-10-CM

## 2013-06-01 DIAGNOSIS — I2589 Other forms of chronic ischemic heart disease: Secondary | ICD-10-CM

## 2013-06-01 NOTE — Progress Notes (Signed)
ICD check in clinic. Normal device function. Thresholds and sensing consistent with previous device measurements. Impedance trends stable over time. Patient had 2 NSVT episodes since last ov on 12-1----max dur. >=15 bts, Max V 177---AT/NSVT. Patient reminded of driving restriction due to treated VT episode on 12-1. No mode switches. Histogram distribution appropriate for patient and level of activity. No changes made this session. Device programmed at appropriate safety margins. Device programmed to optimize intrinsic conduction. Estimated longevity 8.5 years. Plan to follow up with the device clinic in 3 months and with [facility] in December 2015.

## 2013-06-06 ENCOUNTER — Ambulatory Visit: Payer: Medicare Other | Admitting: Interventional Cardiology

## 2013-06-06 ENCOUNTER — Telehealth: Payer: Self-pay | Admitting: Interventional Cardiology

## 2013-06-06 NOTE — Telephone Encounter (Signed)
returned pt call.pt complains of lower edema swelling.pt sts that he was out of spironalcatone for 1 week and just resumed taking on 06/04/13.discussed with DOD inthe office Dr.Nishan.pt is to continue taking and call on Friday if swelling is not improved.pt agreeable with plan and verbalized understanding.

## 2013-06-06 NOTE — Telephone Encounter (Signed)
New message     Patient calling c/o swelling in both legs for couple of days.

## 2013-07-07 ENCOUNTER — Ambulatory Visit (INDEPENDENT_AMBULATORY_CARE_PROVIDER_SITE_OTHER): Payer: Medicare Other | Admitting: Interventional Cardiology

## 2013-07-07 ENCOUNTER — Encounter: Payer: Self-pay | Admitting: Interventional Cardiology

## 2013-07-07 VITALS — BP 110/70 | HR 60 | Ht 72.0 in | Wt 214.0 lb

## 2013-07-07 DIAGNOSIS — I5022 Chronic systolic (congestive) heart failure: Secondary | ICD-10-CM | POA: Diagnosis not present

## 2013-07-07 DIAGNOSIS — I255 Ischemic cardiomyopathy: Secondary | ICD-10-CM

## 2013-07-07 DIAGNOSIS — I2510 Atherosclerotic heart disease of native coronary artery without angina pectoris: Secondary | ICD-10-CM | POA: Diagnosis not present

## 2013-07-07 DIAGNOSIS — I472 Ventricular tachycardia, unspecified: Secondary | ICD-10-CM

## 2013-07-07 DIAGNOSIS — Z95810 Presence of automatic (implantable) cardiac defibrillator: Secondary | ICD-10-CM

## 2013-07-07 DIAGNOSIS — E785 Hyperlipidemia, unspecified: Secondary | ICD-10-CM

## 2013-07-07 DIAGNOSIS — Z79899 Other long term (current) drug therapy: Secondary | ICD-10-CM | POA: Diagnosis not present

## 2013-07-07 DIAGNOSIS — I4729 Other ventricular tachycardia: Secondary | ICD-10-CM

## 2013-07-07 DIAGNOSIS — I2589 Other forms of chronic ischemic heart disease: Secondary | ICD-10-CM

## 2013-07-07 LAB — TSH: TSH: 0.62 u[IU]/mL (ref 0.35–5.50)

## 2013-07-07 LAB — HEPATIC FUNCTION PANEL
ALK PHOS: 31 U/L — AB (ref 39–117)
ALT: 33 U/L (ref 0–53)
AST: 20 U/L (ref 0–37)
Albumin: 3.8 g/dL (ref 3.5–5.2)
BILIRUBIN DIRECT: 0 mg/dL (ref 0.0–0.3)
TOTAL PROTEIN: 6.7 g/dL (ref 6.0–8.3)
Total Bilirubin: 0.8 mg/dL (ref 0.3–1.2)

## 2013-07-07 NOTE — Progress Notes (Signed)
Patient ID: Steven Guzman, male   DOB: 12-28-1940, 73 y.o.   MRN: [id number]    [address], [address] [place], [place]  [address] Phone: [phone] Fax:  [phone]  Date:  07/07/2013   ID:  Steven, Guzman 09-11-1940, MRN [id number]  PCP:  Horton Finer, MD   ASSESSMENT:  1. Chronic systolic heart failure, stable 2. Coronary artery disease, stable without angina 3. Amiodarone therapy to suppress BP 4. AICD, for history of VT  5. Hypertension   PLAN:  1. No change in therapy 2. TSH and hepatic panel today to rule out amiodarone toxicity 3. I encouraged patient to stop smoking 4. Clinical followup in 6 months.   SUBJECTIVE: Steven Guzman is a 73 y.o. male who continues to smoke. He has intermittent lightheadedness and dizziness. He has not had an AICD discharge. He denies angina. He has chronic significant shortness of breath. He has noticed some lower extremity swelling. He has not had angina or needed nitroglycerin. He has not had syncope or and AICD discharge the   Wt Readings from Last 3 Encounters:  07/07/13 214 lb (97.07 kg)  02/27/13 208 lb 1.9 oz (94.403 kg)  11/25/12 199 lb 6.4 oz (90.447 kg)     Past Medical History  Diagnosis Date  . Hypertension   . Hyperlipidemia   . COPD (chronic obstructive pulmonary disease)   . GERD (gastroesophageal reflux disease)   . Diverticulitis   . CAD (coronary artery disease)   . ICD (implantable cardioverter-defibrillator) battery depletion   . Myocardial infarction   . Automatic implantable cardioverter-defibrillator in situ   . Depression   . Aneurysm     abdominal <4    Current Outpatient Prescriptions  Medication Sig Dispense Refill  . amiodarone (PACERONE) 200 MG tablet Take 200 mg by mouth daily.      [name] aspirin 81 MG tablet Take 81 mg by mouth daily.      [name] atorvastatin (LIPITOR) 80 MG tablet Take 80 mg by mouth daily.      . benazepril (LOTENSIN) 10 MG tablet Take 10 mg by mouth  daily.      . busPIRone (BUSPAR) 15 MG tablet Take 15 mg by mouth 2 (two) times daily.      . carvedilol (COREG) 12.5 MG tablet Take 12.5 mg by mouth 2 (two) times daily with a meal.      . Choline Fenofibrate (TRILIPIX) 135 MG capsule Take 135 mg by mouth daily.      . Fluticasone-Salmeterol (ADVAIR) 250-50 MCG/DOSE AEPB Inhale 1 puff into the lungs every 12 (twelve) hours.      [name] GABAPENTIN PO Take by mouth. 3 CAPSULES TWICE DAILY      . Ipratropium-Albuterol (COMBIVENT RESPIMAT) 20-100 MCG/ACT AERS respimat Inhale into the lungs as directed.      . nitroGLYCERIN (NITROSTAT) 0.4 MG SL tablet Place 1 tablet (0.4 mg total) under the tongue every 5 (five) minutes as needed for chest pain.  25 tablet  12  . Omega-3 Fatty Acids (FISH OIL) 1000 MG CAPS Take by mouth.      [name] omeprazole (PRILOSEC) 20 MG capsule Take 20 mg by mouth 2 (two) times daily.      [name] PARoxetine (PAXIL) 40 MG tablet Take 40 mg by mouth every morning.      [name] spironolactone (ALDACTONE) 25 MG tablet Take 25 mg by mouth daily.      . tamsulosin (FLOMAX) 0.4 MG CAPS capsule Take  by mouth.      . vitamin B-12 (CYANOCOBALAMIN) 1000 MCG tablet Take 1,000 mcg by mouth daily.       No current facility-administered medications for this visit.    Allergies:    Allergies  Allergen Reactions  . Sulfa Antibiotics     Social History:  The patient  reports that he has been smoking Cigarettes.  He has been smoking about 1.00 pack per day. He does not have any smokeless tobacco history on file. He reports that he drinks alcohol. He reports that he does not use illicit drugs.   ROS:  Please see the history of present illness.   Poor appetite continues to smoke and has lower extremity swelling   All other systems reviewed and negative.   OBJECTIVE: VS:  BP 110/70  Pulse 60  Ht 6' (1.829 m)  Wt 214 lb (97.07 kg)  BMI 29.02 kg/m2 Well nourished, well developed, in no acute distress, appears older than stated age  3: normal Neck:  JVD flat neck veins. Carotid bruit no bruits  Cardiac:  normal S1, S2; RRR; no murmur Lungs:  clear to auscultation bilaterally, no wheezing, rhonchi or rales Abd: soft, nontender, no hepatomegaly Ext: Edema no significant edema. Pulses 2+ and symmetric  Skin: warm and dry Neuro:  CNs 2-12 intact, no focal abnormalities noted  EKG:  Not repeated       Signed, Illene Labrador III, MD 07/07/2013 12:27 PM

## 2013-07-07 NOTE — Patient Instructions (Signed)
Your physician recommends that you continue on your current medications as directed. Please refer to the Current Medication list given to you today.  Your physician recommends that you go to the lab today for a TSH and Hepatic Panel  Your physician wants you to follow-up in: 6 months with Dr Gaspar Bidding will receive a reminder letter in the mail two months in advance. If you don't receive a letter, please call our office to schedule the follow-up appointment.

## 2013-07-11 ENCOUNTER — Telehealth: Payer: Self-pay

## 2013-07-11 NOTE — Telephone Encounter (Signed)
Message copied by Lamar Laundry on Tue Jul 11, 2013  1:58 PM ------      Message from: Daneen Schick      Created: Sun Jul 09, 2013  2:19 PM       Labs are normal ------

## 2013-07-11 NOTE — Telephone Encounter (Signed)
pt aware of lab results.Labs are normal.pt verbalized understanding. 

## 2013-07-18 DIAGNOSIS — F17200 Nicotine dependence, unspecified, uncomplicated: Secondary | ICD-10-CM | POA: Diagnosis not present

## 2013-07-18 DIAGNOSIS — E538 Deficiency of other specified B group vitamins: Secondary | ICD-10-CM | POA: Diagnosis not present

## 2013-07-18 DIAGNOSIS — I10 Essential (primary) hypertension: Secondary | ICD-10-CM | POA: Diagnosis not present

## 2013-07-18 DIAGNOSIS — M531 Cervicobrachial syndrome: Secondary | ICD-10-CM | POA: Diagnosis not present

## 2013-07-18 DIAGNOSIS — I5022 Chronic systolic (congestive) heart failure: Secondary | ICD-10-CM | POA: Diagnosis not present

## 2013-07-18 DIAGNOSIS — J449 Chronic obstructive pulmonary disease, unspecified: Secondary | ICD-10-CM | POA: Diagnosis not present

## 2013-07-24 DIAGNOSIS — J449 Chronic obstructive pulmonary disease, unspecified: Secondary | ICD-10-CM | POA: Diagnosis not present

## 2013-08-22 ENCOUNTER — Telehealth: Payer: Self-pay | Admitting: Interventional Cardiology

## 2013-08-22 NOTE — Telephone Encounter (Signed)
New Message   Pt called states that he is having issues with his medication amiodarone// States that he is having difficulty breathing and believes that it is a result of the medication.. requests a call back to discuss further,

## 2013-08-22 NOTE — Telephone Encounter (Signed)
amiodarone.pt sts that it was started by his cardilogist in [place].pt has copd and buts sts that he has some increased sob.pt denies swelling,palpitatuon,syncope.pt sts that he had a dizzy spell on Saturday while driving.ptpulled over and let his wife drive.pt sts that he felt as if his blood sugar was low.he had cold sweat and the shakes.pt sts that he felt better after drinking some chocolate milk and crackers.pt want to know if it is necessary for him to be on amiodarone.adv pt that I will fwd a message to Dr.Smith and call back with his recommendations.pt agreeable with plan and verbalized understanding.

## 2013-08-28 ENCOUNTER — Encounter: Payer: Self-pay | Admitting: Internal Medicine

## 2013-08-28 NOTE — Telephone Encounter (Signed)
New message    Want mychart access code

## 2013-08-28 NOTE — Telephone Encounter (Signed)
This encounter was created in error - please disregard.

## 2013-08-31 NOTE — Telephone Encounter (Signed)
Amio is necessary to prevent VT.

## 2013-09-06 NOTE — Telephone Encounter (Signed)
called to give pt Dr.Smith's response. lmtcb 

## 2013-09-06 NOTE — Telephone Encounter (Signed)
Follow Up:  Pt is returning Lisa's call.

## 2013-09-07 NOTE — Telephone Encounter (Signed)
pt given Dr.Smith response on continuing Amiodarone.Amio is necessary to prevent VT.pt has upcoming appt with Dr.Smith to discuss his mediactions.adv pt to continue taking mediactions as prescribed.pt verbalized understanding.

## 2013-09-08 ENCOUNTER — Encounter: Payer: Self-pay | Admitting: Interventional Cardiology

## 2013-09-08 MED ORDER — SPIRONOLACTONE 25 MG PO TABS: 25.0000 mg | ORAL_TABLET | Freq: Every day | ORAL | Status: DC

## 2013-09-19 ENCOUNTER — Encounter: Payer: Self-pay | Admitting: Interventional Cardiology

## 2013-09-19 ENCOUNTER — Ambulatory Visit (INDEPENDENT_AMBULATORY_CARE_PROVIDER_SITE_OTHER): Payer: Medicare Other | Admitting: Interventional Cardiology

## 2013-09-19 ENCOUNTER — Ambulatory Visit
Admission: RE | Admit: 2013-09-19 | Discharge: 2013-09-19 | Disposition: A | Discharge status: Home / Self Care | Payer: Medicare Other | Source: Ambulatory Visit | Attending: Interventional Cardiology | Admitting: Interventional Cardiology

## 2013-09-19 VITALS — BP 120/80 | HR 62 | Ht 72.0 in | Wt 217.0 lb

## 2013-09-19 DIAGNOSIS — I472 Ventricular tachycardia, unspecified: Secondary | ICD-10-CM

## 2013-09-19 DIAGNOSIS — I4729 Other ventricular tachycardia: Secondary | ICD-10-CM

## 2013-09-19 DIAGNOSIS — I5022 Chronic systolic (congestive) heart failure: Secondary | ICD-10-CM | POA: Diagnosis not present

## 2013-09-19 DIAGNOSIS — I2510 Atherosclerotic heart disease of native coronary artery without angina pectoris: Secondary | ICD-10-CM | POA: Diagnosis not present

## 2013-09-19 DIAGNOSIS — Z79899 Other long term (current) drug therapy: Secondary | ICD-10-CM

## 2013-09-19 DIAGNOSIS — J438 Other emphysema: Secondary | ICD-10-CM | POA: Diagnosis not present

## 2013-09-19 DIAGNOSIS — I10 Essential (primary) hypertension: Secondary | ICD-10-CM

## 2013-09-19 LAB — HEPATIC FUNCTION PANEL
ALBUMIN: 4.2 g/dL (ref 3.5–5.2)
ALT: 22 U/L (ref 0–53)
AST: 19 U/L (ref 0–37)
Alkaline Phosphatase: 36 U/L — ABNORMAL LOW (ref 39–117)
Bilirubin, Direct: 0.1 mg/dL (ref 0.0–0.3)
TOTAL PROTEIN: 7 g/dL (ref 6.0–8.3)
Total Bilirubin: 0.8 mg/dL (ref 0.2–1.2)

## 2013-09-19 LAB — TSH: TSH: 3.32 u[IU]/mL (ref 0.35–4.50)

## 2013-09-19 NOTE — Patient Instructions (Signed)
Your physician recommends that you continue on your current medications as directed. Please refer to the Current Medication list given to you today.  Lab today: Tsh, Hepatic   A chest x-ray takes a picture of the organs and structures inside the chest, including the heart, lungs, and blood vessels. This test can show several things, including, whether the heart is enlarges; whether fluid is building up in the lungs; and whether pacemaker / defibrillator leads are still in place.( To be done at [facility], you can walk in you do not need a appointment)  Your physician recommends that you schedule a follow-up appointment with the [facility] physician wants you to follow-up in: 6 months with Dr.Smith You will receive a reminder letter in the mail two months in advance. If you don't receive a letter, please call our office to schedule the follow-up appointment.  Your physician recommends that you return for lab work  And a chest xray in: 6 months

## 2013-09-19 NOTE — Progress Notes (Signed)
Patient ID: Steven Guzman, male   DOB: 06-08-1940, 73 y.o.   MRN: [id number]    [address], [address] [place], [place]  [address] Phone: [phone] Fax:  [phone]  Date:  09/19/2013   ID:  Steven Guzman, DOB 1941/03/25, MRN [id number]  PCP:  Horton Finer, MD   ASSESSMENT:  1. ischemic cardiomyopathy with chronic systolic heart failure, stable 2. Hypertension, controlled 3. Coronary artery disease without angina 4. Amiodarone therapy without obvious evidence of toxicity  PLAN:  1. TSH, hepatic panel, PA and lateral chest x-ray 2. Clinical followup in 6 months with repeat studies as listed above 3. Encouraged to stop smoking   SUBJECTIVE: Steven Guzman is a 73 y.o. male is dyspnea on exertion. He continues to smoke. He has not had an AICD discharge or chest pain consistent with angina. He is 88 is supple nitroglycerin. He denies syncope.   Wt Readings from Last 3 Encounters:  09/19/13 217 lb (98.431 kg)  07/07/13 214 lb (97.07 kg)  02/27/13 208 lb 1.9 oz (94.403 kg)     Past Medical History  Diagnosis Date  . Hypertension   . Hyperlipidemia   . COPD (chronic obstructive pulmonary disease)   . GERD (gastroesophageal reflux disease)   . Diverticulitis   . CAD (coronary artery disease)   . ICD (implantable cardioverter-defibrillator) battery depletion   . Myocardial infarction   . Automatic implantable cardioverter-defibrillator in situ   . Depression   . Aneurysm     abdominal <4    Current Outpatient Prescriptions  Medication Sig Dispense Refill  . amiodarone (PACERONE) 200 MG tablet Take 200 mg by mouth daily.      [name] aspirin 81 MG tablet Take 81 mg by mouth daily.      [name] atorvastatin (LIPITOR) 80 MG tablet Take 80 mg by mouth daily.      . benazepril (LOTENSIN) 10 MG tablet Take 10 mg by mouth daily.      . busPIRone (BUSPAR) 15 MG tablet Take 15 mg by mouth 2 (two) times daily.      . carvedilol (COREG) 12.5 MG tablet Take 12.5 mg by  mouth 2 (two) times daily with a meal.      . Choline Fenofibrate (TRILIPIX) 135 MG capsule Take 135 mg by mouth daily.      . Fluticasone-Salmeterol (ADVAIR) 250-50 MCG/DOSE AEPB Inhale 1 puff into the lungs every 12 (twelve) hours.      [name] GABAPENTIN PO Take by mouth. 3 CAPSULES TWICE DAILY      . Ipratropium-Albuterol (COMBIVENT RESPIMAT) 20-100 MCG/ACT AERS respimat Inhale into the lungs as directed.      . nitroGLYCERIN (NITROSTAT) 0.4 MG SL tablet Place 1 tablet (0.4 mg total) under the tongue every 5 (five) minutes as needed for chest pain.  25 tablet  12  . Omega-3 Fatty Acids (FISH OIL) 1000 MG CAPS Take by mouth.      [name] omeprazole (PRILOSEC) 20 MG capsule Take 20 mg by mouth 2 (two) times daily.      [name] PARoxetine (PAXIL) 40 MG tablet Take 40 mg by mouth every morning.      [name] spironolactone (ALDACTONE) 25 MG tablet Take 1 tablet (25 mg total) by mouth daily.  30 tablet  6  . tamsulosin (FLOMAX) 0.4 MG CAPS capsule Take by mouth.      . vitamin B-12 (CYANOCOBALAMIN) 1000 MCG tablet Take 1,000 mcg by mouth daily.  No current facility-administered medications for this visit.    Allergies:    Allergies  Allergen Reactions  . Sulfa Antibiotics     Social History:  The patient  reports that he has been smoking Cigarettes.  He has been smoking about 1.00 pack per day. He does not have any smokeless tobacco history on file. He reports that he drinks alcohol. He reports that he does not use illicit drugs.   ROS:  Please see the history of present illness.   Continues to smoke cigarettes   All other systems reviewed and negative.   OBJECTIVE: VS:  BP 120/80  Pulse 62  Ht 6' (1.829 m)  Wt 217 lb (98.431 kg)  BMI 29.42 kg/m2  SpO2 94% Well nourished, well developed, in no acute distress, older than stated age 30: normal Neck: JVD flat. Carotid bruit bilateral faint bruits  Cardiac:  normal S1, S2; RRR; no murmur Lungs:  clear to auscultation bilaterally, no wheezing, rhonchi  or rales Abd: soft, nontender, no hepatomegaly Ext: Edema absent. Pulses 2+ and symmetric Skin: warm and dry Neuro:  CNs 2-12 intact, no focal abnormalities noted  EKG:  Not repeated       Signed, Illene Labrador III, MD 09/19/2013 9:29 AM

## 2013-09-20 ENCOUNTER — Ambulatory Visit (INDEPENDENT_AMBULATORY_CARE_PROVIDER_SITE_OTHER): Payer: Medicare Other | Admitting: *Deleted

## 2013-09-20 DIAGNOSIS — I472 Ventricular tachycardia, unspecified: Secondary | ICD-10-CM

## 2013-09-20 DIAGNOSIS — I4729 Other ventricular tachycardia: Secondary | ICD-10-CM

## 2013-09-20 LAB — MDC_IDC_ENUM_SESS_TYPE_INCLINIC
Battery Remaining Longevity: 102 mo
Battery Remaining Percentage: 100 %
Brady Statistic RA Percent Paced: 78 %
Brady Statistic RV Percent Paced: 0 %
HIGH POWER IMPEDANCE MEASURED VALUE: 56 Ohm
Implantable Pulse Generator Serial Number: 164892
Lead Channel Impedance Value: 646 Ohm
Lead Channel Pacing Threshold Amplitude: 0.6 V
Lead Channel Pacing Threshold Amplitude: 0.7 V
Lead Channel Setting Pacing Amplitude: 2 V
Lead Channel Setting Pacing Amplitude: 2 V
MDC IDC MSMT LEADCHNL RA PACING THRESHOLD PULSEWIDTH: 0.4 ms
MDC IDC MSMT LEADCHNL RV IMPEDANCE VALUE: 485 Ohm
MDC IDC MSMT LEADCHNL RV PACING THRESHOLD PULSEWIDTH: 0.4 ms
MDC IDC SESS DTM: 20150624154506
MDC IDC SET LEADCHNL RV PACING PULSEWIDTH: 0.4 ms
MDC IDC SET LEADCHNL RV SENSING SENSITIVITY: 0.6 mV
Zone Setting Detection Interval: 286 ms
Zone Setting Detection Interval: 375 ms

## 2013-09-20 NOTE — Progress Notes (Signed)
ICD check in clinic. Normal device function. Thresholds and sensing consistent with previous device measurements. Impedance trends stable over time. 2 nst episodes. No mode switches. Histogram distribution appropriate for patient and level of activity. No changes made this session. Device programmed at appropriate safety margins. Device programmed to optimize intrinsic conduction. Estimated longevity 8.5 years. Pt enrolled in remote follow-up w/cell adapter (by [name]). Latitude 12-21-13 and ROV in December with GT.

## 2013-09-21 ENCOUNTER — Telehealth: Payer: Self-pay

## 2013-09-21 NOTE — Telephone Encounter (Signed)
called to give pt lab and cxr results.lmtcb

## 2013-09-21 NOTE — Telephone Encounter (Signed)
Message copied by Lamar Laundry on Thu Sep 21, 2013  8:48 AM ------      Message from: Daneen Schick      Created: Wed Sep 20, 2013  1:29 PM       COPD and hiatal hernia. No acute abnormality ------

## 2013-09-21 NOTE — Telephone Encounter (Signed)
Message copied by Lamar Laundry on Thu Sep 21, 2013  8:41 AM ------      Message from: Daneen Schick      Created: Wed Sep 20, 2013  1:29 PM       COPD and hiatal hernia. No acute abnormality ------

## 2013-09-21 NOTE — Telephone Encounter (Signed)
pt called back in.pt aware of lab and cxr results.cxr COPD and hiatal hernia. No acute abnormality. lab Liver and thyr oid studies are normal.pt verbalized understanding.

## 2013-09-22 DIAGNOSIS — L570 Actinic keratosis: Secondary | ICD-10-CM | POA: Diagnosis not present

## 2013-09-22 DIAGNOSIS — D485 Neoplasm of uncertain behavior of skin: Secondary | ICD-10-CM | POA: Diagnosis not present

## 2013-09-25 DIAGNOSIS — L821 Other seborrheic keratosis: Secondary | ICD-10-CM | POA: Diagnosis not present

## 2013-10-03 ENCOUNTER — Encounter: Payer: Self-pay | Admitting: Internal Medicine

## 2013-10-08 DIAGNOSIS — J449 Chronic obstructive pulmonary disease, unspecified: Secondary | ICD-10-CM | POA: Diagnosis not present

## 2013-10-08 DIAGNOSIS — I10 Essential (primary) hypertension: Secondary | ICD-10-CM | POA: Diagnosis not present

## 2013-10-08 DIAGNOSIS — I252 Old myocardial infarction: Secondary | ICD-10-CM | POA: Diagnosis not present

## 2013-10-08 DIAGNOSIS — J441 Chronic obstructive pulmonary disease with (acute) exacerbation: Secondary | ICD-10-CM | POA: Diagnosis not present

## 2013-10-08 DIAGNOSIS — R9431 Abnormal electrocardiogram [ECG] [EKG]: Secondary | ICD-10-CM | POA: Diagnosis not present

## 2013-10-08 DIAGNOSIS — Z79899 Other long term (current) drug therapy: Secondary | ICD-10-CM | POA: Diagnosis not present

## 2013-10-09 ENCOUNTER — Encounter: Payer: Self-pay | Admitting: Nurse Practitioner

## 2013-10-09 ENCOUNTER — Ambulatory Visit (INDEPENDENT_AMBULATORY_CARE_PROVIDER_SITE_OTHER): Payer: Medicare Other | Admitting: Nurse Practitioner

## 2013-10-09 VITALS — BP 130/80 | HR 70 | Ht 72.0 in | Wt 218.8 lb

## 2013-10-09 DIAGNOSIS — I10 Essential (primary) hypertension: Secondary | ICD-10-CM | POA: Diagnosis not present

## 2013-10-09 DIAGNOSIS — R0609 Other forms of dyspnea: Secondary | ICD-10-CM

## 2013-10-09 DIAGNOSIS — I255 Ischemic cardiomyopathy: Secondary | ICD-10-CM

## 2013-10-09 DIAGNOSIS — I5022 Chronic systolic (congestive) heart failure: Secondary | ICD-10-CM

## 2013-10-09 DIAGNOSIS — I2589 Other forms of chronic ischemic heart disease: Secondary | ICD-10-CM | POA: Diagnosis not present

## 2013-10-09 DIAGNOSIS — I2510 Atherosclerotic heart disease of native coronary artery without angina pectoris: Secondary | ICD-10-CM

## 2013-10-09 DIAGNOSIS — R0989 Other specified symptoms and signs involving the circulatory and respiratory systems: Secondary | ICD-10-CM | POA: Diagnosis not present

## 2013-10-09 DIAGNOSIS — F17200 Nicotine dependence, unspecified, uncomplicated: Secondary | ICD-10-CM | POA: Diagnosis not present

## 2013-10-09 DIAGNOSIS — Z720 Tobacco use: Secondary | ICD-10-CM

## 2013-10-09 DIAGNOSIS — R0600 Dyspnea, unspecified: Secondary | ICD-10-CM

## 2013-10-09 MED ORDER — AMOXICILLIN 500 MG PO CAPS: 500.0000 mg | ORAL_CAPSULE | Freq: Three times a day (TID) | ORAL | Status: DC

## 2013-10-09 MED ORDER — FUROSEMIDE 40 MG PO TABS: 40.0000 mg | ORAL_TABLET | Freq: Every day | ORAL | Status: DC

## 2013-10-09 NOTE — Progress Notes (Signed)
Steven Guzman Date of Birth: 1940/12/24 Medical Record #[id number]  History of Present Illness: [name] is seen back today for a work in/post hospital visit. Seen for Dr. Tamala Julian. He is a 73 year old male with an ischemic CM with chronic systolic HF, HTN, CAD and on chronic amiodarone therapy. He continues to smoke. He has had chronic DOE.  He has had prior MI, who developed ventricular tachycardia and underwent ICD implantation several years ago in [place] by Dr. Lovena Le. Because of recurrent ventricular tachycardia, he was placed on amiodarone therapy.   Last seen here just a couple of weeks ago - seemed to be doing ok.  Comes in today. Here with his wife. He notes that he got a head/chest cold last Wednesday. Wife was sick as well. Family member was sick. He has gotten more short of breath. Got so bad and he went to the hospital at [facility] where he was treated - sounds like given nebulizer. PO2 was 65 on his blood gas. They wanted to admit - ? Due to his EKG. He signed himself out. Troponin was negative. Other labs ok. CXR with COPD. Weight has gone up a few pounds. Not clear as to whether he restricts his salt. No actual chest pain. Yellow nasal drainage. More short of breath with exertion - especially carrying items. Today feels like his "cold" is coming back. No fever. No chills. Was using Mucinex. Now on tapering steroids. Continues to smoke.    Current Outpatient Prescriptions  Medication Sig Dispense Refill  . amiodarone (PACERONE) 200 MG tablet Take 200 mg by mouth daily.      [name] aspirin 81 MG tablet Take 81 mg by mouth daily.      [name] atorvastatin (LIPITOR) 80 MG tablet Take 80 mg by mouth daily.      . benazepril (LOTENSIN) 10 MG tablet Take 10 mg by mouth daily.      . busPIRone (BUSPAR) 15 MG tablet Take 15 mg by mouth 2 (two) times daily.      . carvedilol (COREG) 12.5 MG tablet Take 12.5 mg by mouth 2 (two) times daily with a meal.      . Choline Fenofibrate  (TRILIPIX) 135 MG capsule Take 135 mg by mouth daily.      . Fluticasone-Salmeterol (ADVAIR) 250-50 MCG/DOSE AEPB Inhale 1 puff into the lungs every 12 (twelve) hours.      [name] GABAPENTIN PO Take by mouth. 3 CAPSULES TWICE DAILY      . Ipratropium-Albuterol (COMBIVENT RESPIMAT) 20-100 MCG/ACT AERS respimat Inhale into the lungs as directed.      . nitroGLYCERIN (NITROSTAT) 0.4 MG SL tablet Place 1 tablet (0.4 mg total) under the tongue every 5 (five) minutes as needed for chest pain.  25 tablet  12  . Omega-3 Fatty Acids (FISH OIL) 1000 MG CAPS Take by mouth.      [name] omeprazole (PRILOSEC) 20 MG capsule Take 20 mg by mouth 2 (two) times daily.      [name] PARoxetine (PAXIL) 40 MG tablet Take 40 mg by mouth every morning.      . predniSONE (DELTASONE) 10 MG tablet Take 10 mg by mouth daily with breakfast. Taper pack      . spironolactone (ALDACTONE) 25 MG tablet Take 1 tablet (25 mg total) by mouth daily.  30 tablet  6  . tamsulosin (FLOMAX) 0.4 MG CAPS capsule Take by mouth.      . vitamin B-12 (CYANOCOBALAMIN) 1000  MCG tablet Take 1,000 mcg by mouth daily.       No current facility-administered medications for this visit.    Allergies  Allergen Reactions  . Sulfa Antibiotics     Past Medical History  Diagnosis Date  . Hypertension   . Hyperlipidemia   . COPD (chronic obstructive pulmonary disease)   . GERD (gastroesophageal reflux disease)   . Diverticulitis   . CAD (coronary artery disease)   . ICD (implantable cardioverter-defibrillator) battery depletion   . Myocardial infarction   . Automatic implantable cardioverter-defibrillator in situ   . Depression   . Aneurysm     abdominal <4    Past Surgical History  Procedure Laterality Date  . Hernia repair    . Foot surgery    . Hemorrhoid banding    . Retinal detachment surgery    . Angioplasty    . Cardiac catheterization    . Nose surgery      skin cancer    History  Smoking status  . Current Every Day Smoker -- 1.00  packs/day  . Types: Cigarettes  Smokeless tobacco  . Not on file    History  Alcohol Use  . Yes    Comment: occasionally     Family History  Problem Relation Age of Onset  . Heart attack Brother   . CAD Father   . CAD Mother     Review of Systems: The review of systems is per the HPI.  All other systems were reviewed and are negative.  Physical Exam: BP 130/80  Pulse 70  Ht 6' (1.829 m)  Wt 218 lb 12.8 oz (99.247 kg)  BMI 29.67 kg/m2  SpO2 93% Patient is alert and in no acute distress. Complexion ruddy. Skin is warm and dry. Color is normal. Lots of bruising/discoloration on his arms.  HEENT is unremarkable. Normocephalic/atraumatic. PERRL. Sclera are nonicteric. Neck is supple. No masses. No JVD. Lungs are clear. Cardiac exam shows a regular rate and rhythm. Abdomen is soft. Extremities are with pedal edema. Gait and ROM are intact. No gross neurologic deficits noted.  Wt Readings from Last 3 Encounters:  10/09/13 218 lb 12.8 oz (99.247 kg)  09/19/13 217 lb (98.431 kg)  07/07/13 214 lb (97.07 kg)    LABORATORY DATA/PROCEDURES:  EKG from 10/08/13 shows A pacing, inferior Q's and lateral ST/T wave changes. CBC was normal. Chemistries normal. PO2 was 65 on his blood gas.   Lab Results  Component Value Date   WBC 7.2 11/25/2012   HGB 13.8 11/25/2012   HCT 39.3 11/25/2012   PLT 178 11/25/2012   GLUCOSE 100* 11/25/2012   CHOL 150 11/25/2012   TRIG 98 11/25/2012   HDL 43 11/25/2012   LDLCALC 87 11/25/2012   ALT 22 09/19/2013   AST 19 09/19/2013   NA 136 11/25/2012   K 3.8 11/25/2012   CL 102 11/25/2012   CREATININE 0.98 11/25/2012   BUN 11 11/25/2012   CO2 26 11/25/2012   TSH 3.32 09/19/2013   INR 1.04 11/25/2012   HGBA1C 5.5 11/25/2012    BNP (last 3 results) No results found for this basename: PROBNP,  in the last 8760 hours    PROCEDURE: Left heart catheterization with selective coronary angiography, left ventriculogram from August 2014.  INDICATIONS: Angina occurring  at rest on 2 occasions within the past 7 days. Complicated prior cardiac history with large inferior MI 1995 treated with PCI. Subsequent LAD stenting. LVEF less than 35%. AICD implantation in [place] 2010.  Sustained ventricular tachycardia was indication .  The risks, benefits, and details of the procedure were explained to the patient. The patient verbalized understanding and wanted to proceed. Informed written consent was obtained.  PROCEDURE TECHNIQUE: After Xylocaine anesthesia a 5 French sheath was placed in the right radial artery with a single anterior needle wall stick. Coronary angiography was done using a 5 French A2 MP and 3.5 cm JL catheter. Left ventriculography was done using a 5 [place] A2 MP catheter.  CONTRAST: Total of 85 cc.  COMPLICATIONS: None.  HEMODYNAMICS: Aortic pressure was 104/58 mmHg; LV pressure was 108/15 mmHg; LVEDP 18 mm mercury. There was no gradient between the left ventricle and aorta.  ANGIOGRAPHIC DATA: The left main coronary artery is mid to distal less than 30% narrowing. Distal calcifications..  The left anterior descending artery is proximal stent is widely patent. The LAD beyond the stent is moderately diseased with diathesis luminal irregularities but no high-grade obstruction..  The left circumflex artery is a large vessel that supplies 2 obtuse marginals. Eccentric 50% stenosis is noted after the first obtuse marginal.  The right coronary artery is diffusely disease. Stents are noted in the proximal to mid vessel as well as the distal RCA near the PDA. Irregularities up to 30-50% and noted throughout. The PDA contains eccentric 80-90% obstruction. The PDA is small. The right coronary territory is a site of myocardial infarction 19 years ago. The inferior wall is akinetic.[name]  LEFT VENTRICULOGRAM: Left ventricular angiogram was done in the 30 RAO projection and revealed a dilated cavity with inferior akinesis and ejection fraction of 30% %. LVEDP was 18 mmHg.    IMPRESSIONS: 1. Patent LAD and right coronary stents.  2. Ostial PDA with 80-90% obstruction. 50% proximal circumflex after the first obtuse marginal. That's use LAD disease without focal high-grade stenosis in the proximal to mid LAD.  3. Dilated left ventricle with EF 30%, inferior akinesis with mild hypokinesis of all the walls. No MR is noted  RECOMMENDATION: Angina may be coming from the PDA or due to vasovagal constriction in other territories. For now sublingual nitroglycerin should be used. A followup clinically in 7-10 days . Discharged later this evening.    Assessment / Plan: 1. URI/COPD exacerbation/ongoing tobacco abuse  - I have added antibiotics to his regimen. Refer to pulmonary with PFTs with diffusion ordered. Smoking cessation imperative. His use of amiodarone may also be a contributing factor.   2. Ischemic CM -  Weight is up. Some volume overload. Will check BNP today. May need to update his echo. Will ask Dr. Tamala Julian to review. Have put him on Lasix 40 mg to take for 3 days, then cut to 20 mg a day.   3. Ongoing tobacco abuse - asking about Chantix - I was not comfortable prescribing this given his use of Buspar and Paxil.   I have asked him to call us with a follow up in just a few days. May need to consider echo. Further disposition to follow.  Patient is agreeable to this plan and will call if any problems develop in the interim.   Burtis Junes, RN, [place] [address] [place], [place]  [address] [phone]

## 2013-10-09 NOTE — Patient Instructions (Addendum)
Stay on your current medicines but I am adding Lasix 40 mg to take one a day for 3 days and then cut back to just a half a pill daily  Weight daily  I am putting you on antibiotics for a week - amoxicillin three times a day for a week  We will check a fluid level test on you today - labwork  We will arrange for PFTs and see the pulmonary doctor  Take the prednisone as prescribed  See Dr. Tamala Julian as planned  Call me with an update in just a few days  Call the [facility] office at [phone] if you have any questions, problems or concerns.

## 2013-10-10 LAB — BRAIN NATRIURETIC PEPTIDE: Pro B Natriuretic peptide (BNP): 36 pg/mL (ref 0.0–100.0)

## 2013-10-14 DIAGNOSIS — R062 Wheezing: Secondary | ICD-10-CM | POA: Diagnosis not present

## 2013-10-14 DIAGNOSIS — R079 Chest pain, unspecified: Secondary | ICD-10-CM | POA: Diagnosis not present

## 2013-10-16 ENCOUNTER — Ambulatory Visit (INDEPENDENT_AMBULATORY_CARE_PROVIDER_SITE_OTHER): Payer: Medicare Other | Admitting: Pulmonary Disease

## 2013-10-16 ENCOUNTER — Ambulatory Visit (INDEPENDENT_AMBULATORY_CARE_PROVIDER_SITE_OTHER)
Admission: RE | Admit: 2013-10-16 | Discharge: 2013-10-16 | Disposition: A | Discharge status: Home / Self Care | Payer: Medicare Other | Source: Ambulatory Visit | Attending: Pulmonary Disease | Admitting: Pulmonary Disease

## 2013-10-16 ENCOUNTER — Encounter: Payer: Self-pay | Admitting: Pulmonary Disease

## 2013-10-16 VITALS — BP 126/70 | HR 65 | Ht 72.0 in | Wt 220.0 lb

## 2013-10-16 DIAGNOSIS — I5022 Chronic systolic (congestive) heart failure: Secondary | ICD-10-CM

## 2013-10-16 DIAGNOSIS — R0600 Dyspnea, unspecified: Secondary | ICD-10-CM

## 2013-10-16 DIAGNOSIS — R0989 Other specified symptoms and signs involving the circulatory and respiratory systems: Secondary | ICD-10-CM

## 2013-10-16 DIAGNOSIS — R0609 Other forms of dyspnea: Secondary | ICD-10-CM | POA: Diagnosis not present

## 2013-10-16 DIAGNOSIS — J441 Chronic obstructive pulmonary disease with (acute) exacerbation: Secondary | ICD-10-CM | POA: Diagnosis not present

## 2013-10-16 DIAGNOSIS — F17200 Nicotine dependence, unspecified, uncomplicated: Secondary | ICD-10-CM | POA: Diagnosis not present

## 2013-10-16 DIAGNOSIS — Z720 Tobacco use: Secondary | ICD-10-CM

## 2013-10-16 DIAGNOSIS — I2510 Atherosclerotic heart disease of native coronary artery without angina pectoris: Secondary | ICD-10-CM | POA: Diagnosis not present

## 2013-10-16 MED ORDER — NICOTINE 10 MG IN INHA: 1.0000 | RESPIRATORY_TRACT | Status: DC | PRN

## 2013-10-16 MED ORDER — PREDNISONE 10 MG PO TABS: 20.0000 mg | ORAL_TABLET | Freq: Every day | ORAL | Status: DC

## 2013-10-16 NOTE — Assessment & Plan Note (Signed)
He appears euvolemic on exam today. I recommended that he maintain his current heart failure regimen including Lasix at current dosing.

## 2013-10-16 NOTE — Patient Instructions (Addendum)
Take the prednisone for 5 days Take advair twice a day no matter how you feel Try the spiriva daily no matter how you feel, call us to let us know if it helps We will arrange pulmonary rehab We will arrange a screening CT for lung cancer Try the nicotrol inhaler  We will see you back in 2 months or sooner if needed

## 2013-10-16 NOTE — Progress Notes (Signed)
Subjective:    Patient ID: Steven Guzman, male    DOB: [birth year], 73 y.o.   MRN: [id number]  HPI  This is a very pleasant 73 year old mouth who comes to my clinic today for evaluation of COPD. He has a history of ischemic cardiomyopathy with an ICD in place and a history of what sounds like V. tach. He comes to me today because he feels his COPD has been getting worse. He was diagnosed with COPD several years ago by pulmonologist in [place] but he never followed up after the first visit. He has been taking Advair for years. He has been experiencing increasing shortness of breath over the last several years. He says that he can walk through [place] without difficulty but he has difficulty carrying groceries. He can run a vacuum cleaner without difficulty. He does not have to climb stairs. He does have a cough productive of mucus which is decreased significantly since he quit smoking approximately one week ago. About a week ago he had to go to the emergency department for increasing chest tightness and shortness of breath. He is getting a breathing treatment as well as prednisone for likely COPD exacerbation. He then followed up with his cardiologist who recommended he come to see me. Since completing the prednisone he says his wheezing initially improved quite a bit and shortness of breath improved as well. His cough has persisted but is no longer productive of mucus. On Saturday (2 days ago) unfortunately he had some chest pain and had to call 911. Apparently the emergency medical technicians performed an EKG which was normal and found him to be wheezing. He was given a nebulizer treatment which she said helped quite a bit. Since then he has noticed some persistent chest tightness and shortness of breath. He does not have chest pain. He only takes his Advair once a day when he remembers it. He rarely takes it twice a day. He previously smoked a pack a day of cigarettes for the last 60 years and quit 7  days ago. He has experienced leg swelling in the past but he has not had any problems with this in the last several weeks. He has had increased abdominal girth which he wonders if somehow related to fluid retention in his cardiomyopathy. He states that he has been compliant with his CHF regimen of medicines. He try Chantix in the past and he said that this did not cause adverse side effects but because of "life events" that happened he had to stop using it because he was afraid of the side effects. He says that since quitting smoking he has had continued cravings for cigarettes but he has not had one in a week.  Past Medical History  Diagnosis Date  . Hypertension   . Hyperlipidemia   . COPD (chronic obstructive pulmonary disease)   . GERD (gastroesophageal reflux disease)   . Diverticulitis   . CAD (coronary artery disease)   . ICD (implantable cardioverter-defibrillator) battery depletion   . Myocardial infarction   . Automatic implantable cardioverter-defibrillator in situ   . Depression   . Aneurysm     abdominal <4     Family History  Problem Relation Age of Onset  . Heart attack Brother   . CAD Father   . CAD Mother      History   Social History  . Marital Status: Married    Spouse Name: N/A    Number of Children: N/A  . Years of Education:  N/A   Occupational History  . Not on file.   Social History Main Topics  . Smoking status: Former Smoker -- 1.00 packs/day for 55 years    Types: Cigarettes    Quit date: 10/09/2013  . Smokeless tobacco: Never Used  . Alcohol Use: Yes     Comment: occasionally   . Drug Use: No  . Sexual Activity: Not Currently   Other Topics Concern  . Not on file   Social History Narrative  . No narrative on file     Allergies  Allergen Reactions  . Sulfa Antibiotics     hives     Outpatient Prescriptions Prior to Visit  Medication Sig Dispense Refill  . amiodarone (PACERONE) 200 MG tablet Take 200 mg by mouth daily.      [name]  aspirin 81 MG tablet Take 81 mg by mouth daily.      [name] atorvastatin (LIPITOR) 80 MG tablet Take 80 mg by mouth daily.      . benazepril (LOTENSIN) 10 MG tablet Take 10 mg by mouth daily.      . busPIRone (BUSPAR) 15 MG tablet Take 15 mg by mouth 2 (two) times daily.      . carvedilol (COREG) 12.5 MG tablet Take 12.5 mg by mouth 2 (two) times daily with a meal.      . Choline Fenofibrate (TRILIPIX) 135 MG capsule Take 135 mg by mouth daily.      . Fluticasone-Salmeterol (ADVAIR) 250-50 MCG/DOSE AEPB Inhale 1 puff into the lungs every 12 (twelve) hours.      [name] GABAPENTIN PO Take by mouth. 3 CAPSULES TWICE DAILY      . Ipratropium-Albuterol (COMBIVENT RESPIMAT) 20-100 MCG/ACT AERS respimat Inhale into the lungs as directed.      . nitroGLYCERIN (NITROSTAT) 0.4 MG SL tablet Place 1 tablet (0.4 mg total) under the tongue every 5 (five) minutes as needed for chest pain.  25 tablet  12  . Omega-3 Fatty Acids (FISH OIL) 1000 MG CAPS Take by mouth.      [name] omeprazole (PRILOSEC) 20 MG capsule Take 20 mg by mouth 2 (two) times daily.      [name] PARoxetine (PAXIL) 40 MG tablet Take 40 mg by mouth every morning.      . predniSONE (DELTASONE) 10 MG tablet Take 10 mg by mouth daily with breakfast. Taper pack      . spironolactone (ALDACTONE) 25 MG tablet Take 1 tablet (25 mg total) by mouth daily.  30 tablet  6  . tamsulosin (FLOMAX) 0.4 MG CAPS capsule Take by mouth.      . vitamin B-12 (CYANOCOBALAMIN) 1000 MCG tablet Take 1,000 mcg by mouth daily.      . furosemide (LASIX) 40 MG tablet Take 1 tablet (40 mg total) by mouth daily. TO TAKE ONE PILL DAILY FOR 3 DAYS AND THEN DECREASE TO 1/2 PILL EACH DAY  90 tablet  3  . amoxicillin (AMOXIL) 500 MG capsule Take 1 capsule (500 mg total) by mouth 3 (three) times daily.  21 capsule  0   No facility-administered medications prior to visit.      Review of Systems  Constitutional: Negative for fever and unexpected weight change.  HENT: Negative for congestion,  dental problem, ear pain, nosebleeds, postnasal drip, rhinorrhea, sinus pressure, sneezing, sore throat and trouble swallowing.   Eyes: Negative for redness and itching.  Respiratory: Positive for chest tightness and shortness of breath. Negative for cough and wheezing.   Cardiovascular: Negative  for palpitations and leg swelling.  Gastrointestinal: Negative for nausea and vomiting.  Genitourinary: Negative for dysuria.  Musculoskeletal: Negative for joint swelling.  Skin: Negative for rash.  Neurological: Negative for headaches.  Hematological: Does not bruise/bleed easily.  Psychiatric/Behavioral: Positive for dysphoric mood. The patient is nervous/anxious.        Objective:   Physical Exam Filed Vitals:   10/16/13 1001  BP: 126/70  Pulse: 65  Height: 6' (1.829 m)  Weight: 220 lb (99.791 kg)  SpO2: 97%   RA  Gen: well appearing, no acute distress HEENT: NCAT, PERRL, EOMi, OP clear, neck supple without masses PULM: Wheezing L > R CV: RRR, systolic murmur, no JVD AB: BS+, soft, nontender, no hsm Ext: warm, no edema, no clubbing, no cyanosis Derm: no rash or skin breakdown Neuro: A&Ox4, CN II-XII intact, strength 5/5 in all 4 extremities        Assessment & Plan:   COPD exacerbation He tells that he had moderate airflow obstruction on spirometry a month ago. Often COPD symptoms can be worse than spirometry findings. Based on his need to get in the ER and be treated for an exacerbation as well as ongoing wheezing I would say that his symptoms are severe. I need to get copies of the spirometry test to confirm that he does have COPD but I presume that this is the case.  He is profoundly deconditioned and this is certainly contributing to his dyspnea. He does not appear volume overloaded to me on exam today but certainly his congestive heart failure contributes to his dyspnea to some degree.  Plan: -Obtain results of simple spirometry from a month ago -5 days of  prednisone -Pulmonary rehabilitation referral -Trial of Spiriva -Continue Advair -Followup 6-8 weeks  Chronic systolic heart failure He appears euvolemic on exam today. I recommended that he maintain his current heart failure regimen including Lasix at current dosing.  Tobacco abuse Based on his lengthy smoking history he should undergo the lung cancer screening with a CT scan. Today we discussed the risks and benefits of CT screening including the false positive possibility. I congratulated him on quitting smoking and recommended that he try Nicotrol inhalers to satisfy his cravings.   Updated Medication List Outpatient Encounter Prescriptions as of 10/16/2013  Medication Sig  . amiodarone (PACERONE) 200 MG tablet Take 200 mg by mouth daily.  [name] aspirin 81 MG tablet Take 81 mg by mouth daily.  [name] atorvastatin (LIPITOR) 80 MG tablet Take 80 mg by mouth daily.  . benazepril (LOTENSIN) 10 MG tablet Take 10 mg by mouth daily.  . busPIRone (BUSPAR) 15 MG tablet Take 15 mg by mouth 2 (two) times daily.  . carvedilol (COREG) 12.5 MG tablet Take 12.5 mg by mouth 2 (two) times daily with a meal.  . Choline Fenofibrate (TRILIPIX) 135 MG capsule Take 135 mg by mouth daily.  . Fluticasone-Salmeterol (ADVAIR) 250-50 MCG/DOSE AEPB Inhale 1 puff into the lungs every 12 (twelve) hours.  . furosemide (LASIX) 40 MG tablet Take 40 mg by mouth daily. TO TAKE ONE PILL DAILY  . GABAPENTIN PO Take by mouth. 3 CAPSULES TWICE DAILY  . Ipratropium-Albuterol (COMBIVENT RESPIMAT) 20-100 MCG/ACT AERS respimat Inhale into the lungs as directed.  . nitroGLYCERIN (NITROSTAT) 0.4 MG SL tablet Place 1 tablet (0.4 mg total) under the tongue every 5 (five) minutes as needed for chest pain.  . Omega-3 Fatty Acids (FISH OIL) 1000 MG CAPS Take by mouth.  [name] omeprazole (PRILOSEC) 20 MG capsule  Take 20 mg by mouth 2 (two) times daily.  [name] PARoxetine (PAXIL) 40 MG tablet Take 40 mg by mouth every morning.  [name] spironolactone  (ALDACTONE) 25 MG tablet Take 1 tablet (25 mg total) by mouth daily.  . tamsulosin (FLOMAX) 0.4 MG CAPS capsule Take by mouth.  . vitamin B-12 (CYANOCOBALAMIN) 1000 MCG tablet Take 1,000 mcg by mouth daily.  . [DISCONTINUED] furosemide (LASIX) 40 MG tablet Take 1 tablet (40 mg total) by mouth daily. TO TAKE ONE PILL DAILY FOR 3 DAYS AND THEN DECREASE TO 1/2 PILL EACH DAY  . [DISCONTINUED] predniSONE (DELTASONE) 10 MG tablet Take 10 mg by mouth daily with breakfast. Taper pack  . nicotine (NICOTROL) 10 MG inhaler Inhale 1 cartridge (1 continuous puffing total) into the lungs as needed for smoking cessation.  . predniSONE (DELTASONE) 10 MG tablet Take 2 tablets (20 mg total) by mouth daily with breakfast.  . [DISCONTINUED] amoxicillin (AMOXIL) 500 MG capsule Take 1 capsule (500 mg total) by mouth 3 (three) times daily.

## 2013-10-16 NOTE — Assessment & Plan Note (Signed)
Based on his lengthy smoking history he should undergo the lung cancer screening with a CT scan. Today we discussed the risks and benefits of CT screening including the false positive possibility. I congratulated him on quitting smoking and recommended that he try Nicotrol inhalers to satisfy his cravings.

## 2013-10-16 NOTE — Progress Notes (Signed)
Quick Note:  atc X1, someone answered, line went dead. WCB. ______

## 2013-10-16 NOTE — Assessment & Plan Note (Signed)
He tells that he had moderate airflow obstruction on spirometry a month ago. Often COPD symptoms can be worse than spirometry findings. Based on his need to get in the ER and be treated for an exacerbation as well as ongoing wheezing I would say that his symptoms are severe. I need to get copies of the spirometry test to confirm that he does have COPD but I presume that this is the case.  He is profoundly deconditioned and this is certainly contributing to his dyspnea. He does not appear volume overloaded to me on exam today but certainly his congestive heart failure contributes to his dyspnea to some degree.  Plan: -Obtain results of simple spirometry from a month ago -5 days of prednisone -Pulmonary rehabilitation referral -Trial of Spiriva -Continue Advair -Followup 6-8 weeks

## 2013-10-18 NOTE — Progress Notes (Signed)
Quick Note:  Spoke with pt, he is aware of results and recs. Nothing further needed. ______

## 2013-10-19 ENCOUNTER — Ambulatory Visit (INDEPENDENT_AMBULATORY_CARE_PROVIDER_SITE_OTHER)
Admission: RE | Admit: 2013-10-19 | Discharge: 2013-10-19 | Disposition: A | Discharge status: Home / Self Care | Payer: Medicare Other | Source: Ambulatory Visit | Attending: Pulmonary Disease | Admitting: Pulmonary Disease

## 2013-10-19 DIAGNOSIS — R0609 Other forms of dyspnea: Secondary | ICD-10-CM

## 2013-10-19 DIAGNOSIS — Z122 Encounter for screening for malignant neoplasm of respiratory organs: Secondary | ICD-10-CM

## 2013-10-19 DIAGNOSIS — R0600 Dyspnea, unspecified: Secondary | ICD-10-CM

## 2013-10-19 DIAGNOSIS — R0989 Other specified symptoms and signs involving the circulatory and respiratory systems: Secondary | ICD-10-CM

## 2013-10-20 ENCOUNTER — Telehealth: Payer: Self-pay | Admitting: Pulmonary Disease

## 2013-10-20 NOTE — Progress Notes (Signed)
Quick Note:  LMTCB ______ 

## 2013-10-20 NOTE — Telephone Encounter (Signed)
Notes Recorded by Juanito Doom, MD on 10/19/2013 at 9:05 PM A, Please let him know that this was OK. He didn't have any worrisome findings other than COPD and coronary artery disease which we already knew about. Thanks B ---  I spoke with patient about results and he verbalized understanding and had no questions

## 2013-11-01 ENCOUNTER — Ambulatory Visit (INDEPENDENT_AMBULATORY_CARE_PROVIDER_SITE_OTHER): Payer: Medicare Other | Admitting: Pulmonary Disease

## 2013-11-01 ENCOUNTER — Institutional Professional Consult (permissible substitution): Payer: Medicare Other | Admitting: Internal Medicine

## 2013-11-01 DIAGNOSIS — I255 Ischemic cardiomyopathy: Secondary | ICD-10-CM

## 2013-11-01 DIAGNOSIS — I5022 Chronic systolic (congestive) heart failure: Secondary | ICD-10-CM

## 2013-11-01 DIAGNOSIS — I10 Essential (primary) hypertension: Secondary | ICD-10-CM

## 2013-11-01 DIAGNOSIS — Z720 Tobacco use: Secondary | ICD-10-CM

## 2013-11-01 DIAGNOSIS — R0600 Dyspnea, unspecified: Secondary | ICD-10-CM

## 2013-11-01 LAB — PULMONARY FUNCTION TEST
DL/VA % pred: 71 %
DL/VA: 3.33 ml/min/mmHg/L
DLCO unc % pred: 73 %
DLCO unc: 25.39 ml/min/mmHg
FEF 25-75 Post: 1.5 L/sec
FEF 25-75 Pre: 1.06 L/sec
FEF2575-%Change-Post: 41 %
FEF2575-%Pred-Post: 63 %
FEF2575-%Pred-Pre: 44 %
FEV1-%Change-Post: 15 %
FEV1-%Pred-Post: 77 %
FEV1-%Pred-Pre: 67 %
FEV1-Post: 2.53 L
FEV1-Pre: 2.19 L
FEV1FVC-%Change-Post: 4 %
FEV1FVC-%Pred-Pre: 79 %
FEV6-%Change-Post: 9 %
FEV6-%Pred-Post: 94 %
FEV6-%Pred-Pre: 86 %
FEV6-Post: 3.97 L
FEV6-Pre: 3.64 L
FEV6FVC-%Change-Post: 0 %
FEV6FVC-%Pred-Post: 101 %
FEV6FVC-%Pred-Pre: 102 %
FVC-%Change-Post: 10 %
FVC-%Pred-Post: 92 %
FVC-%Pred-Pre: 84 %
FVC-Post: 4.16 L
FVC-Pre: 3.78 L
Post FEV1/FVC ratio: 61 %
Post FEV6/FVC ratio: 96 %
Pre FEV1/FVC ratio: 58 %
Pre FEV6/FVC Ratio: 96 %
RV % pred: 161 %
RV: 4.25 L
TLC % pred: 117 %
TLC: 8.65 L

## 2013-11-01 NOTE — Progress Notes (Signed)
PFT done today. 

## 2013-11-05 ENCOUNTER — Encounter: Payer: Self-pay | Admitting: Interventional Cardiology

## 2013-11-07 ENCOUNTER — Other Ambulatory Visit: Payer: Self-pay

## 2013-11-07 MED ORDER — AMIODARONE HCL 200 MG PO TABS: 200.0000 mg | ORAL_TABLET | Freq: Every day | ORAL | Status: DC

## 2013-11-10 ENCOUNTER — Telehealth (HOSPITAL_COMMUNITY): Payer: Self-pay

## 2013-11-10 NOTE — Telephone Encounter (Signed)
Called patient regarding entrance to Pulmonary Rehab.  Patient states that they are interested in attending the program.  Steven Guzman is going to verify insurance coverage and follow up.    

## 2013-11-14 ENCOUNTER — Encounter: Payer: Self-pay | Admitting: Pulmonary Disease

## 2013-11-14 ENCOUNTER — Telehealth (HOSPITAL_COMMUNITY): Payer: Self-pay

## 2013-11-14 MED ORDER — TIOTROPIUM BROMIDE MONOHYDRATE 18 MCG IN CAPS: 18.0000 ug | ORAL_CAPSULE | Freq: Every day | RESPIRATORY_TRACT | Status: DC

## 2013-11-14 NOTE — Telephone Encounter (Signed)
Patient states that he can not do the program due to insurance not covering at 100%.  Patient was offered the maintenance program and he states that he will think about it and follow up.

## 2013-11-15 ENCOUNTER — Telehealth: Payer: Self-pay

## 2013-11-15 NOTE — Telephone Encounter (Signed)
Dr. Lake Bells, Remer Macho, NP, is requesting results on pt's PFT that June Leap performed.  When you get a moment, could you look at that pft and forward the results to either myself or her?  Thank you! Caryl Pina

## 2013-11-20 ENCOUNTER — Encounter: Payer: Self-pay | Admitting: Interventional Cardiology

## 2013-11-20 ENCOUNTER — Telehealth: Payer: Self-pay | Admitting: Nurse Practitioner

## 2013-11-20 DIAGNOSIS — J449 Chronic obstructive pulmonary disease, unspecified: Secondary | ICD-10-CM | POA: Diagnosis not present

## 2013-11-20 DIAGNOSIS — I10 Essential (primary) hypertension: Secondary | ICD-10-CM | POA: Diagnosis not present

## 2013-11-20 DIAGNOSIS — I2510 Atherosclerotic heart disease of native coronary artery without angina pectoris: Secondary | ICD-10-CM | POA: Diagnosis not present

## 2013-11-20 DIAGNOSIS — M79609 Pain in unspecified limb: Secondary | ICD-10-CM | POA: Diagnosis not present

## 2013-11-20 NOTE — Telephone Encounter (Signed)
Called Cardiology, Spoke with Neoma Laming.  She will ask Andee Poles Remer Macho, NP's, nurse to call office back.  Will need to ensure they can see BQ's results and nothing further is needed.  As msg has been closed, will hold in triage until we speak with Cardiology.

## 2013-11-20 NOTE — Telephone Encounter (Signed)
Moderate COPD

## 2013-11-20 NOTE — Telephone Encounter (Signed)
Cardiology calling again a/bout results of pft results.Steven Guzman

## 2013-11-20 NOTE — Telephone Encounter (Signed)
Dr. McQuaid please advise.  Thank you.  

## 2013-11-20 NOTE — Telephone Encounter (Signed)
New problem   Please call back for PFT results..please ask for triage when you call [facility].

## 2013-11-22 DIAGNOSIS — L819 Disorder of pigmentation, unspecified: Secondary | ICD-10-CM | POA: Diagnosis not present

## 2013-11-22 DIAGNOSIS — D1801 Hemangioma of skin and subcutaneous tissue: Secondary | ICD-10-CM | POA: Diagnosis not present

## 2013-11-22 DIAGNOSIS — M47812 Spondylosis without myelopathy or radiculopathy, cervical region: Secondary | ICD-10-CM | POA: Diagnosis not present

## 2013-11-22 DIAGNOSIS — M6740 Ganglion, unspecified site: Secondary | ICD-10-CM | POA: Diagnosis not present

## 2013-11-22 DIAGNOSIS — R229 Localized swelling, mass and lump, unspecified: Secondary | ICD-10-CM | POA: Diagnosis not present

## 2013-11-22 DIAGNOSIS — B353 Tinea pedis: Secondary | ICD-10-CM | POA: Diagnosis not present

## 2013-11-22 DIAGNOSIS — D239 Other benign neoplasm of skin, unspecified: Secondary | ICD-10-CM | POA: Diagnosis not present

## 2013-11-22 DIAGNOSIS — L570 Actinic keratosis: Secondary | ICD-10-CM | POA: Diagnosis not present

## 2013-11-22 DIAGNOSIS — L821 Other seborrheic keratosis: Secondary | ICD-10-CM | POA: Diagnosis not present

## 2013-11-22 DIAGNOSIS — M65849 Other synovitis and tenosynovitis, unspecified hand: Secondary | ICD-10-CM | POA: Diagnosis not present

## 2013-11-22 DIAGNOSIS — M65839 Other synovitis and tenosynovitis, unspecified forearm: Secondary | ICD-10-CM | POA: Diagnosis not present

## 2013-11-22 DIAGNOSIS — M19049 Primary osteoarthritis, unspecified hand: Secondary | ICD-10-CM | POA: Diagnosis not present

## 2013-11-27 DIAGNOSIS — M65839 Other synovitis and tenosynovitis, unspecified forearm: Secondary | ICD-10-CM | POA: Diagnosis not present

## 2013-11-27 DIAGNOSIS — M65849 Other synovitis and tenosynovitis, unspecified hand: Secondary | ICD-10-CM | POA: Diagnosis not present

## 2013-11-28 ENCOUNTER — Encounter (HOSPITAL_BASED_OUTPATIENT_CLINIC_OR_DEPARTMENT_OTHER): Payer: Self-pay | Admitting: *Deleted

## 2013-11-28 ENCOUNTER — Other Ambulatory Visit: Payer: Self-pay | Admitting: Orthopedic Surgery

## 2013-11-28 NOTE — Progress Notes (Signed)
11/28/13 1446  OBSTRUCTIVE SLEEP APNEA  Have you ever been diagnosed with sleep apnea through a sleep study? No  Do you snore loudly (loud enough to be heard through closed doors)?  0  Do you often feel tired, fatigued, or sleepy during the daytime? 0  Has anyone observed you stop breathing during your sleep? 0  Do you have, or are you being treated for high blood pressure? 1  BMI more than 35 kg/m2? 0  Age over 75 years old? 1  Neck circumference greater than 40 cm/16 inches? 1  Gender: 1  Obstructive Sleep Apnea Score 4  Score 4 or greater  Results sent to PCP

## 2013-11-28 NOTE — Progress Notes (Signed)
Talked with dr crews extensively about this pt-moving to main or-he talked with dr Fredna Dow and said they will do block and do him here Will need istat

## 2013-11-29 ENCOUNTER — Encounter: Payer: Self-pay | Admitting: Interventional Cardiology

## 2013-12-01 ENCOUNTER — Telehealth: Payer: Self-pay | Admitting: Internal Medicine

## 2013-12-01 NOTE — Telephone Encounter (Signed)
Steven Guzman informed that form was faxed. She states that she received it.

## 2013-12-01 NOTE — Telephone Encounter (Signed)
New message     Pt is having surgery on tues and he has a defibulator.  Did we get the for from them regarding his defib and whether or not a device rep needs to be there?  Please call

## 2013-12-05 ENCOUNTER — Encounter (HOSPITAL_BASED_OUTPATIENT_CLINIC_OR_DEPARTMENT_OTHER)
Admission: RE | Disposition: A | Discharge status: Home / Self Care | Payer: Self-pay | Source: Ambulatory Visit | Attending: Orthopedic Surgery

## 2013-12-05 ENCOUNTER — Ambulatory Visit (HOSPITAL_BASED_OUTPATIENT_CLINIC_OR_DEPARTMENT_OTHER)
Admission: RE | Admit: 2013-12-05 | Discharge: 2013-12-05 | Disposition: A | Discharge status: Home / Self Care | Payer: Medicare Other | Source: Ambulatory Visit | Attending: Orthopedic Surgery | Admitting: Orthopedic Surgery

## 2013-12-05 ENCOUNTER — Telehealth: Payer: Self-pay | Admitting: Interventional Cardiology

## 2013-12-05 ENCOUNTER — Encounter (HOSPITAL_BASED_OUTPATIENT_CLINIC_OR_DEPARTMENT_OTHER): Payer: Self-pay | Admitting: *Deleted

## 2013-12-05 ENCOUNTER — Encounter (HOSPITAL_BASED_OUTPATIENT_CLINIC_OR_DEPARTMENT_OTHER): Payer: Self-pay | Admitting: Anesthesiology

## 2013-12-05 DIAGNOSIS — I252 Old myocardial infarction: Secondary | ICD-10-CM | POA: Insufficient documentation

## 2013-12-05 DIAGNOSIS — E785 Hyperlipidemia, unspecified: Secondary | ICD-10-CM | POA: Diagnosis not present

## 2013-12-05 DIAGNOSIS — M6580 Other synovitis and tenosynovitis, unspecified site: Secondary | ICD-10-CM | POA: Insufficient documentation

## 2013-12-05 DIAGNOSIS — J4489 Other specified chronic obstructive pulmonary disease: Secondary | ICD-10-CM | POA: Diagnosis not present

## 2013-12-05 DIAGNOSIS — K219 Gastro-esophageal reflux disease without esophagitis: Secondary | ICD-10-CM | POA: Diagnosis not present

## 2013-12-05 DIAGNOSIS — F3289 Other specified depressive episodes: Secondary | ICD-10-CM | POA: Diagnosis not present

## 2013-12-05 DIAGNOSIS — Z7982 Long term (current) use of aspirin: Secondary | ICD-10-CM | POA: Diagnosis not present

## 2013-12-05 DIAGNOSIS — I959 Hypotension, unspecified: Secondary | ICD-10-CM | POA: Insufficient documentation

## 2013-12-05 DIAGNOSIS — Z79899 Other long term (current) drug therapy: Secondary | ICD-10-CM | POA: Diagnosis not present

## 2013-12-05 DIAGNOSIS — I2510 Atherosclerotic heart disease of native coronary artery without angina pectoris: Secondary | ICD-10-CM | POA: Diagnosis not present

## 2013-12-05 DIAGNOSIS — Z95810 Presence of automatic (implantable) cardiac defibrillator: Secondary | ICD-10-CM | POA: Insufficient documentation

## 2013-12-05 DIAGNOSIS — Z882 Allergy status to sulfonamides status: Secondary | ICD-10-CM | POA: Insufficient documentation

## 2013-12-05 DIAGNOSIS — F17200 Nicotine dependence, unspecified, uncomplicated: Secondary | ICD-10-CM | POA: Insufficient documentation

## 2013-12-05 DIAGNOSIS — F329 Major depressive disorder, single episode, unspecified: Secondary | ICD-10-CM | POA: Diagnosis not present

## 2013-12-05 DIAGNOSIS — J449 Chronic obstructive pulmonary disease, unspecified: Secondary | ICD-10-CM | POA: Diagnosis not present

## 2013-12-05 DIAGNOSIS — Z538 Procedure and treatment not carried out for other reasons: Secondary | ICD-10-CM | POA: Insufficient documentation

## 2013-12-05 DIAGNOSIS — I10 Essential (primary) hypertension: Secondary | ICD-10-CM | POA: Insufficient documentation

## 2013-12-05 SURGERY — INCISION, TENDON SHEATH
Anesthesia: Choice | Site: Wrist | Laterality: Right

## 2013-12-05 MED ORDER — CEFAZOLIN SODIUM-DEXTROSE 2-3 GM-% IV SOLR: 2.0000 g | INTRAVENOUS | Status: DC

## 2013-12-05 MED ORDER — FENTANYL CITRATE 0.05 MG/ML IJ SOLN
INTRAMUSCULAR | Status: AC
  Filled 2013-12-05: qty 4

## 2013-12-05 MED ORDER — LACTATED RINGERS IV SOLN: INTRAVENOUS | Status: DC

## 2013-12-05 MED ORDER — BUPIVACAINE HCL (PF) 0.25 % IJ SOLN
INTRAMUSCULAR | Status: AC
Start: 2013-12-05 — End: 2013-12-05
  Filled 2013-12-05: qty 30

## 2013-12-05 MED ORDER — FUROSEMIDE 40 MG PO TABS: 20.0000 mg | ORAL_TABLET | Freq: Every day | ORAL | Status: DC

## 2013-12-05 MED ORDER — MIDAZOLAM HCL 2 MG/2ML IJ SOLN: 1.0000 mg | INTRAMUSCULAR | Status: DC | PRN

## 2013-12-05 MED ORDER — CHLORHEXIDINE GLUCONATE 4 % EX LIQD: 60.0000 mL | Freq: Once | CUTANEOUS | Status: DC

## 2013-12-05 MED ORDER — MIDAZOLAM HCL 2 MG/2ML IJ SOLN
INTRAMUSCULAR | Status: AC
  Filled 2013-12-05: qty 2

## 2013-12-05 MED ORDER — FENTANYL CITRATE 0.05 MG/ML IJ SOLN: 50.0000 ug | INTRAMUSCULAR | Status: DC | PRN

## 2013-12-05 SURGICAL SUPPLY — 72 items
BAG DECANTER FOR FLEXI CONT (MISCELLANEOUS) IMPLANT
BALL CTTN LRG ABS STRL LF (GAUZE/BANDAGES/DRESSINGS)
BLADE MINI RND TIP GREEN BEAV (BLADE) IMPLANT
BLADE SURG 15 STRL LF DISP TIS (BLADE) ×1 IMPLANT
BLADE SURG 15 STRL SS (BLADE) ×3
BNDG CMPR 9X4 STRL LF SNTH (GAUZE/BANDAGES/DRESSINGS)
BNDG COHESIVE 3X5 TAN STRL LF (GAUZE/BANDAGES/DRESSINGS) ×3 IMPLANT
BNDG ESMARK 4X9 LF (GAUZE/BANDAGES/DRESSINGS) IMPLANT
BNDG GAUZE ELAST 4 BULKY (GAUZE/BANDAGES/DRESSINGS) ×3 IMPLANT
CHLORAPREP W/TINT 26ML (MISCELLANEOUS) ×3 IMPLANT
CORDS BIPOLAR (ELECTRODE) ×3 IMPLANT
COTTONBALL LRG STERILE PKG (GAUZE/BANDAGES/DRESSINGS) IMPLANT
COVER MAYO STAND STRL (DRAPES) ×3 IMPLANT
COVER TABLE BACK 60X90 (DRAPES) ×3 IMPLANT
CUFF TOURNIQUET SINGLE 18IN (TOURNIQUET CUFF) IMPLANT
DECANTER SPIKE VIAL GLASS SM (MISCELLANEOUS) IMPLANT
DRAIN TLS ROUND 10FR (DRAIN) IMPLANT
DRAPE EXTREMITY T 121X128X90 (DRAPE) ×3 IMPLANT
DRAPE OEC MINIVIEW 54X84 (DRAPES) IMPLANT
DRAPE SURG 17X23 STRL (DRAPES) ×3 IMPLANT
DRSG KUZMA FLUFF (GAUZE/BANDAGES/DRESSINGS) IMPLANT
GAUZE SPONGE 4X4 12PLY STRL (GAUZE/BANDAGES/DRESSINGS) ×3 IMPLANT
GAUZE SPONGE 4X4 16PLY XRAY LF (GAUZE/BANDAGES/DRESSINGS) IMPLANT
GAUZE XEROFORM 1X8 LF (GAUZE/BANDAGES/DRESSINGS) ×3 IMPLANT
GLOVE BIOGEL PI IND STRL 8.5 (GLOVE) ×1 IMPLANT
GLOVE BIOGEL PI INDICATOR 8.5 (GLOVE) ×2
GLOVE SURG ORTHO 8.0 STRL STRW (GLOVE) ×3 IMPLANT
GOWN STRL REUS W/ TWL LRG LVL3 (GOWN DISPOSABLE) ×1 IMPLANT
GOWN STRL REUS W/TWL LRG LVL3 (GOWN DISPOSABLE) ×3
GOWN STRL REUS W/TWL XL LVL3 (GOWN DISPOSABLE) ×3 IMPLANT
LOOP VESSEL MAXI BLUE (MISCELLANEOUS) IMPLANT
NDL KEITH (NEEDLE) IMPLANT
NEEDLE 27GAX1X1/2 (NEEDLE) IMPLANT
NEEDLE HYPO 22GX1.5 SAFETY (NEEDLE) IMPLANT
NEEDLE KEITH (NEEDLE) IMPLANT
NS IRRIG 1000ML POUR BTL (IV SOLUTION) ×3 IMPLANT
PACK BASIN DAY SURGERY FS (CUSTOM PROCEDURE TRAY) ×3 IMPLANT
PAD CAST 3X4 CTTN HI CHSV (CAST SUPPLIES) ×1 IMPLANT
PAD CAST 4YDX4 CTTN HI CHSV (CAST SUPPLIES) IMPLANT
PADDING CAST ABS 3INX4YD NS (CAST SUPPLIES)
PADDING CAST ABS 4INX4YD NS (CAST SUPPLIES) ×2
PADDING CAST ABS COTTON 3X4 (CAST SUPPLIES) IMPLANT
PADDING CAST ABS COTTON 4X4 ST (CAST SUPPLIES) ×1 IMPLANT
PADDING CAST COTTON 3X4 STRL (CAST SUPPLIES) ×3
PADDING CAST COTTON 4X4 STRL (CAST SUPPLIES)
SLEEVE SCD COMPRESS KNEE MED (MISCELLANEOUS) IMPLANT
SPLINT PLASTER CAST XFAST 3X15 (CAST SUPPLIES) IMPLANT
SPLINT PLASTER XTRA FASTSET 3X (CAST SUPPLIES)
STOCKINETTE 4X48 STRL (DRAPES) ×3 IMPLANT
SUT CHROMIC 5 0 P 3 (SUTURE) IMPLANT
SUT ETHIBOND 3-0 V-5 (SUTURE) IMPLANT
SUT MERSILENE 2.0 SH NDLE (SUTURE) IMPLANT
SUT MERSILENE 3 0 FS 1 (SUTURE) IMPLANT
SUT MERSILENE 4 0 P 3 (SUTURE) IMPLANT
SUT POLY BUTTON 15MM (SUTURE) IMPLANT
SUT PROLENE 2 0 SH DA (SUTURE) IMPLANT
SUT SILK 4 0 PS 2 (SUTURE) IMPLANT
SUT STEEL 3 0 (SUTURE) IMPLANT
SUT STEEL 4 0 V 26 (SUTURE) IMPLANT
SUT VIC AB 3-0 PS1 18 (SUTURE)
SUT VIC AB 3-0 PS1 18XBRD (SUTURE) IMPLANT
SUT VIC AB 4-0 P-3 18XBRD (SUTURE) IMPLANT
SUT VIC AB 4-0 P2 18 (SUTURE) IMPLANT
SUT VIC AB 4-0 P3 18 (SUTURE)
SUT VICRYL 4-0 PS2 18IN ABS (SUTURE) IMPLANT
SUT VICRYL RAPID 5 0 P 3 (SUTURE) IMPLANT
SUT VICRYL RAPIDE 4/0 PS 2 (SUTURE) ×1 IMPLANT
SYR BULB 3OZ (MISCELLANEOUS) ×3 IMPLANT
SYR CONTROL 10ML LL (SYRINGE) IMPLANT
TOWEL OR 17X24 6PK STRL BLUE (TOWEL DISPOSABLE) ×6 IMPLANT
TUBE FEEDING 5FR 15 INCH (TUBING) IMPLANT
UNDERPAD 30X30 INCONTINENT (UNDERPADS AND DIAPERS) ×3 IMPLANT

## 2013-12-05 NOTE — H&P (Signed)
   Emarie Paul R 12/05/2013, 7:35 AM

## 2013-12-05 NOTE — Progress Notes (Signed)
Pt presented with low BP (see doc flowsheet). Dr. Annye Asa discussed with Dr. Fredna Dow and decision made to cancel surgery for today

## 2013-12-05 NOTE — Telephone Encounter (Signed)
New message      Talk to a nurse regarding bp problems

## 2013-12-05 NOTE — Telephone Encounter (Signed)
returned pt call.adv pt per Tera Helper, NP hold carvedilol this evening.adv pt that he should be taking lasix 20mg  instead of 40mg  adv pt I will update Dr.Smith in the morning and call back with his recommendations. pt agreeable and verbalized understanding.

## 2013-12-05 NOTE — Telephone Encounter (Signed)
returned pt call pt sts that he was scheduled today for hand Sx with Dr.Kuzma.pt Sx was potponed due to pt low bp of 76/52.pt sts that he does not mnitor his bp regularly at home.pt did take all his morning meds. pt sts that he rechecked his bp at home and it was 100/63.pt has held his evening dose of carvedilol. Pt sts that he was started on Lasix 40mg  daily on 7/13 and would like to d/cadv pt that he should not STOP a medication without talking with a doctor.Harvin Hazel pt that I will need to update Dr.Smith and call back with his recommendations.

## 2013-12-06 NOTE — Telephone Encounter (Signed)
pt given Dr.Smith recommendations to stop furosemide due to low bp. pt adv to continue to monitor bp at home. pt adv to watch for swelling,sob. pt adv to call the office if symptoms develop.pt agreeable and verbalized understanding.

## 2013-12-11 NOTE — Telephone Encounter (Signed)
The patient should stay off diuretic therapy. He is cleared for the upcoming surgery as long as his blood pressure has recovered above systolic blood pressure 90 mm mercury and he is asymptomatic.

## 2013-12-11 NOTE — Telephone Encounter (Signed)
Steven Guzman @ Dr.Kuzma office aware that pt is cleared for hand Sx. encounter from [facility] on 12/05/13 indicates clearance. Their office will f/u with pt. to reschedule sx.

## 2013-12-12 ENCOUNTER — Telehealth: Payer: Self-pay | Admitting: Pulmonary Disease

## 2013-12-12 ENCOUNTER — Encounter: Payer: Self-pay | Admitting: Pulmonary Disease

## 2013-12-12 ENCOUNTER — Encounter: Payer: Self-pay | Admitting: Interventional Cardiology

## 2013-12-12 ENCOUNTER — Ambulatory Visit (INDEPENDENT_AMBULATORY_CARE_PROVIDER_SITE_OTHER): Payer: Medicare Other | Admitting: Pulmonary Disease

## 2013-12-12 VITALS — BP 100/78 | HR 59 | Temp 98.1°F | Ht 72.0 in | Wt 224.6 lb

## 2013-12-12 DIAGNOSIS — J441 Chronic obstructive pulmonary disease with (acute) exacerbation: Secondary | ICD-10-CM | POA: Diagnosis not present

## 2013-12-12 DIAGNOSIS — I2510 Atherosclerotic heart disease of native coronary artery without angina pectoris: Secondary | ICD-10-CM | POA: Diagnosis not present

## 2013-12-12 MED ORDER — PREDNISONE 10 MG PO TABS: ORAL_TABLET | ORAL | Status: DC

## 2013-12-12 NOTE — Addendum Note (Signed)
Addended by: Inge Rise on: 12/12/2013 11:42 AM   Modules accepted: Orders

## 2013-12-12 NOTE — Assessment & Plan Note (Signed)
The patient is having an acute COPD exacerbation probably related to a viral upper respiratory infection. He has not had any acute distress, and his saturations are adequate. Will treat with a short course of prednisone to get him through this, and will also get him back on his usual Advair. I've also asked him to continue the Spiriva until he sees his primary pulmonologist. Will hold off on antibiotics since he is not having a lot of chest congestion or purulence, but he is to call us if this changes.

## 2013-12-12 NOTE — Progress Notes (Signed)
   Subjective:    Patient ID: Steven Guzman, male    DOB: March 31, 1940, 73 y.o.   MRN: [id number]  HPI The patient comes in today for an acute sick visit. He has known COPD, and gives a four-day history of increasing nasal congestion with a sore throat, cough with minimal nonpurulent mucus, and now some mild chest congestion. He is not producing purulent mucus, nor is he having any fevers, chills, or sweats.  He has seen some increasing shortness of breath, but no chest discomfort.  The patient had Spiriva added to his regimen at the last visit, but he mistakenly thought it should replace his Advair. He has not been on Advair for one month.   Review of Systems  Constitutional: Negative for fever and unexpected weight change.  HENT: Positive for congestion, postnasal drip and rhinorrhea. Negative for dental problem, ear pain, nosebleeds, sinus pressure, sneezing, sore throat and trouble swallowing.   Eyes: Negative for redness and itching.  Respiratory: Positive for cough, shortness of breath and wheezing. Negative for chest tightness.   Cardiovascular: Negative for palpitations and leg swelling.  Gastrointestinal: Negative for nausea and vomiting.  Genitourinary: Negative for dysuria.  Musculoskeletal: Negative for joint swelling.  Skin: Negative for rash.  Neurological: Negative for headaches.  Hematological: Does not bruise/bleed easily.  Psychiatric/Behavioral: Negative for dysphoric mood. The patient is not nervous/anxious.        Objective:   Physical Exam Well-developed male in no acute distress Nose without purulence or discharge noted Neck without lymphadenopathy or thyromegaly Chest with good airflow and no crackles, but there are scattered wheezes bilaterally. Cardiac exam with regular rate and rhythm Lower extremities with minimal edema, no cyanosis Alert and oriented, moves all 4 extremities.       Assessment & Plan:

## 2013-12-12 NOTE — Patient Instructions (Signed)
Get back on your advair 250/50 one inhalation am and pm, in addition to staying on spiriva Will treat with an 8 day course of prednisone to get you thru this episode. Let us know if you become more congested and begin to cough up discolored mucus. Can try mucinex dm one in am and pm with large glass of water while sick.  Keep followup apptm with Dr. Lake Bells, but let us know if you are not improving.

## 2013-12-12 NOTE — Telephone Encounter (Signed)
Called, spoke with pt -  C/o sore throat started last week.  Now has head congestion, PND, increased SOB with little activity, and cough with small amount of yellow mucus.  Using rescue inhaler with relief.  Believes he needs breathing tx - scheduled to see [facility] today at 11 am at [facility] office.  Pt aware.

## 2013-12-14 ENCOUNTER — Other Ambulatory Visit: Payer: Self-pay | Admitting: Orthopedic Surgery

## 2013-12-15 ENCOUNTER — Encounter (HOSPITAL_BASED_OUTPATIENT_CLINIC_OR_DEPARTMENT_OTHER): Payer: Self-pay | Admitting: *Deleted

## 2013-12-15 NOTE — Progress Notes (Signed)
Pt was to have this 12/05/13-was cleared by cardiology and dr crews-icd-copd-saw dr clance this week-put on steroid taper, better-lasix stopped due to low bp-will need istat

## 2013-12-21 ENCOUNTER — Ambulatory Visit (HOSPITAL_BASED_OUTPATIENT_CLINIC_OR_DEPARTMENT_OTHER): Payer: Medicare Other | Admitting: Anesthesiology

## 2013-12-21 ENCOUNTER — Encounter (HOSPITAL_BASED_OUTPATIENT_CLINIC_OR_DEPARTMENT_OTHER): Payer: Medicare Other | Admitting: Anesthesiology

## 2013-12-21 ENCOUNTER — Encounter (HOSPITAL_BASED_OUTPATIENT_CLINIC_OR_DEPARTMENT_OTHER)
Admission: RE | Disposition: A | Discharge status: Home / Self Care | Payer: Self-pay | Source: Ambulatory Visit | Attending: Orthopedic Surgery

## 2013-12-21 ENCOUNTER — Ambulatory Visit (HOSPITAL_BASED_OUTPATIENT_CLINIC_OR_DEPARTMENT_OTHER)
Admission: RE | Admit: 2013-12-21 | Discharge: 2013-12-21 | Disposition: A | Discharge status: Home / Self Care | Payer: Medicare Other | Source: Ambulatory Visit | Attending: Orthopedic Surgery | Admitting: Orthopedic Surgery

## 2013-12-21 ENCOUNTER — Encounter: Payer: Self-pay | Admitting: Interventional Cardiology

## 2013-12-21 ENCOUNTER — Encounter (HOSPITAL_BASED_OUTPATIENT_CLINIC_OR_DEPARTMENT_OTHER): Payer: Self-pay | Admitting: Orthopedic Surgery

## 2013-12-21 DIAGNOSIS — I2510 Atherosclerotic heart disease of native coronary artery without angina pectoris: Secondary | ICD-10-CM | POA: Diagnosis not present

## 2013-12-21 DIAGNOSIS — Z882 Allergy status to sulfonamides status: Secondary | ICD-10-CM | POA: Diagnosis not present

## 2013-12-21 DIAGNOSIS — J4489 Other specified chronic obstructive pulmonary disease: Secondary | ICD-10-CM | POA: Insufficient documentation

## 2013-12-21 DIAGNOSIS — S63259A Unspecified dislocation of unspecified finger, initial encounter: Secondary | ICD-10-CM | POA: Insufficient documentation

## 2013-12-21 DIAGNOSIS — M65839 Other synovitis and tenosynovitis, unspecified forearm: Secondary | ICD-10-CM | POA: Insufficient documentation

## 2013-12-21 DIAGNOSIS — M66239 Spontaneous rupture of extensor tendons, unspecified forearm: Secondary | ICD-10-CM | POA: Diagnosis not present

## 2013-12-21 DIAGNOSIS — Z79899 Other long term (current) drug therapy: Secondary | ICD-10-CM | POA: Diagnosis not present

## 2013-12-21 DIAGNOSIS — Z7982 Long term (current) use of aspirin: Secondary | ICD-10-CM | POA: Diagnosis not present

## 2013-12-21 DIAGNOSIS — I252 Old myocardial infarction: Secondary | ICD-10-CM | POA: Insufficient documentation

## 2013-12-21 DIAGNOSIS — X58XXXA Exposure to other specified factors, initial encounter: Secondary | ICD-10-CM | POA: Diagnosis not present

## 2013-12-21 DIAGNOSIS — M65849 Other synovitis and tenosynovitis, unspecified hand: Principal | ICD-10-CM

## 2013-12-21 DIAGNOSIS — Y929 Unspecified place or not applicable: Secondary | ICD-10-CM | POA: Insufficient documentation

## 2013-12-21 DIAGNOSIS — M66349 Spontaneous rupture of flexor tendons, unspecified hand: Secondary | ICD-10-CM

## 2013-12-21 DIAGNOSIS — J449 Chronic obstructive pulmonary disease, unspecified: Secondary | ICD-10-CM | POA: Insufficient documentation

## 2013-12-21 DIAGNOSIS — F3289 Other specified depressive episodes: Secondary | ICD-10-CM | POA: Diagnosis not present

## 2013-12-21 DIAGNOSIS — Z95810 Presence of automatic (implantable) cardiac defibrillator: Secondary | ICD-10-CM | POA: Diagnosis not present

## 2013-12-21 DIAGNOSIS — F329 Major depressive disorder, single episode, unspecified: Secondary | ICD-10-CM | POA: Insufficient documentation

## 2013-12-21 DIAGNOSIS — K219 Gastro-esophageal reflux disease without esophagitis: Secondary | ICD-10-CM | POA: Diagnosis not present

## 2013-12-21 DIAGNOSIS — I10 Essential (primary) hypertension: Secondary | ICD-10-CM | POA: Insufficient documentation

## 2013-12-21 DIAGNOSIS — M66339 Spontaneous rupture of flexor tendons, unspecified forearm: Secondary | ICD-10-CM | POA: Insufficient documentation

## 2013-12-21 DIAGNOSIS — M66249 Spontaneous rupture of extensor tendons, unspecified hand: Secondary | ICD-10-CM | POA: Insufficient documentation

## 2013-12-21 DIAGNOSIS — E785 Hyperlipidemia, unspecified: Secondary | ICD-10-CM | POA: Diagnosis not present

## 2013-12-21 DIAGNOSIS — F17200 Nicotine dependence, unspecified, uncomplicated: Secondary | ICD-10-CM | POA: Diagnosis not present

## 2013-12-21 HISTORY — PX: TENOLYSIS: SHX396

## 2013-12-21 LAB — POCT I-STAT, CHEM 8
BUN: 17 mg/dL (ref 6–23)
Calcium, Ion: 1.12 mmol/L — ABNORMAL LOW (ref 1.13–1.30)
Chloride: 105 mEq/L (ref 96–112)
Creatinine, Ser: 1.4 mg/dL — ABNORMAL HIGH (ref 0.50–1.35)
Glucose, Bld: 100 mg/dL — ABNORMAL HIGH (ref 70–99)
HCT: 49 % (ref 39.0–52.0)
Hemoglobin: 16.7 g/dL (ref 13.0–17.0)
Potassium: 4.1 mEq/L (ref 3.7–5.3)
Sodium: 139 mEq/L (ref 137–147)
TCO2: 22 mmol/L (ref 0–100)

## 2013-12-21 SURGERY — INCISION, TENDON SHEATH
Anesthesia: Monitor Anesthesia Care | Site: Wrist | Laterality: Right

## 2013-12-21 MED ORDER — BUPIVACAINE HCL (PF) 0.25 % IJ SOLN
INTRAMUSCULAR | Status: AC
  Filled 2013-12-21: qty 30

## 2013-12-21 MED ORDER — MIDAZOLAM HCL 2 MG/2ML IJ SOLN
INTRAMUSCULAR | Status: AC
  Filled 2013-12-21: qty 2

## 2013-12-21 MED ORDER — MIDAZOLAM HCL 5 MG/5ML IJ SOLN
INTRAMUSCULAR | Status: DC | PRN
  Administered 2013-12-21 (×2): 1 mg via INTRAVENOUS

## 2013-12-21 MED ORDER — FENTANYL CITRATE 0.05 MG/ML IJ SOLN
INTRAMUSCULAR | Status: DC | PRN
Start: 2013-12-21 — End: 2013-12-21
  Administered 2013-12-21 (×2): 50 ug via INTRAVENOUS

## 2013-12-21 MED ORDER — CHLORHEXIDINE GLUCONATE 4 % EX LIQD: 60.0000 mL | Freq: Once | CUTANEOUS | Status: DC

## 2013-12-21 MED ORDER — MIDAZOLAM HCL 2 MG/2ML IJ SOLN: 1.0000 mg | INTRAMUSCULAR | Status: DC | PRN

## 2013-12-21 MED ORDER — HYDROCODONE-ACETAMINOPHEN 10-325 MG PO TABS: 1.0000 | ORAL_TABLET | Freq: Four times a day (QID) | ORAL | Status: DC | PRN

## 2013-12-21 MED ORDER — CEFAZOLIN SODIUM-DEXTROSE 2-3 GM-% IV SOLR: 2.0000 g | INTRAVENOUS | Status: DC

## 2013-12-21 MED ORDER — BUPIVACAINE HCL (PF) 0.25 % IJ SOLN
INTRAMUSCULAR | Status: DC | PRN
  Administered 2013-12-21: 3 mL
  Administered 2013-12-21: 5 mL

## 2013-12-21 MED ORDER — FENTANYL CITRATE 0.05 MG/ML IJ SOLN
INTRAMUSCULAR | Status: AC
  Filled 2013-12-21: qty 2

## 2013-12-21 MED ORDER — FENTANYL CITRATE 0.05 MG/ML IJ SOLN: 50.0000 ug | INTRAMUSCULAR | Status: DC | PRN

## 2013-12-21 MED ORDER — ONDANSETRON HCL 4 MG/2ML IJ SOLN
INTRAMUSCULAR | Status: DC | PRN
  Administered 2013-12-21: 4 mg via INTRAVENOUS

## 2013-12-21 MED ORDER — PROPOFOL 10 MG/ML IV BOLUS
INTRAVENOUS | Status: AC
  Filled 2013-12-21: qty 60

## 2013-12-21 MED ORDER — PROPOFOL INFUSION 10 MG/ML OPTIME
INTRAVENOUS | Status: DC | PRN
  Administered 2013-12-21: 50 ug/kg/min via INTRAVENOUS

## 2013-12-21 MED ORDER — LIDOCAINE HCL (PF) 0.5 % IJ SOLN
INTRAMUSCULAR | Status: DC | PRN
  Administered 2013-12-21: 50 mL via INTRAVENOUS

## 2013-12-21 MED ORDER — LACTATED RINGERS IV SOLN
INTRAVENOUS | Status: DC
  Administered 2013-12-21: 11:00:00 via INTRAVENOUS

## 2013-12-21 MED ORDER — CEFAZOLIN SODIUM-DEXTROSE 2-3 GM-% IV SOLR
INTRAVENOUS | Status: AC
  Filled 2013-12-21: qty 50

## 2013-12-21 MED ORDER — CEFAZOLIN SODIUM-DEXTROSE 2-3 GM-% IV SOLR
2.0000 g | INTRAVENOUS | Status: AC
  Administered 2013-12-21: 2 g via INTRAVENOUS

## 2013-12-21 SURGICAL SUPPLY — 80 items
BAG DECANTER FOR FLEXI CONT (MISCELLANEOUS) IMPLANT
BALL CTTN LRG ABS STRL LF (GAUZE/BANDAGES/DRESSINGS)
BLADE MINI RND TIP GREEN BEAV (BLADE) ×2 IMPLANT
BLADE SURG 15 STRL LF DISP TIS (BLADE) ×1 IMPLANT
BLADE SURG 15 STRL SS (BLADE) ×3
BNDG CMPR 9X4 STRL LF SNTH (GAUZE/BANDAGES/DRESSINGS) ×1
BNDG COHESIVE 3X5 TAN STRL LF (GAUZE/BANDAGES/DRESSINGS) ×3 IMPLANT
BNDG ESMARK 4X9 LF (GAUZE/BANDAGES/DRESSINGS) ×2 IMPLANT
BNDG GAUZE ELAST 4 BULKY (GAUZE/BANDAGES/DRESSINGS) ×3 IMPLANT
CHLORAPREP W/TINT 26ML (MISCELLANEOUS) ×3 IMPLANT
CORDS BIPOLAR (ELECTRODE) ×3 IMPLANT
COTTONBALL LRG STERILE PKG (GAUZE/BANDAGES/DRESSINGS) IMPLANT
COVER MAYO STAND STRL (DRAPES) ×3 IMPLANT
COVER TABLE BACK 60X90 (DRAPES) ×3 IMPLANT
CUFF TOURNIQUET SINGLE 18IN (TOURNIQUET CUFF) IMPLANT
DECANTER SPIKE VIAL GLASS SM (MISCELLANEOUS) ×2 IMPLANT
DRAIN TLS ROUND 10FR (DRAIN) IMPLANT
DRAPE EXTREMITY TIBURON (DRAPES) ×3 IMPLANT
DRAPE OEC MINIVIEW 54X84 (DRAPES) IMPLANT
DRAPE SURG 17X23 STRL (DRAPES) ×3 IMPLANT
DRSG KUZMA FLUFF (GAUZE/BANDAGES/DRESSINGS) IMPLANT
GAUZE SPONGE 4X4 12PLY STRL (GAUZE/BANDAGES/DRESSINGS) ×3 IMPLANT
GAUZE SPONGE 4X4 16PLY XRAY LF (GAUZE/BANDAGES/DRESSINGS) IMPLANT
GAUZE XEROFORM 1X8 LF (GAUZE/BANDAGES/DRESSINGS) ×3 IMPLANT
GLOVE BIO SURGEON STRL SZ7.5 (GLOVE) ×2 IMPLANT
GLOVE BIOGEL PI IND STRL 7.0 (GLOVE) IMPLANT
GLOVE BIOGEL PI IND STRL 8 (GLOVE) IMPLANT
GLOVE BIOGEL PI IND STRL 8.5 (GLOVE) ×1 IMPLANT
GLOVE BIOGEL PI INDICATOR 7.0 (GLOVE) ×2
GLOVE BIOGEL PI INDICATOR 8 (GLOVE) ×2
GLOVE BIOGEL PI INDICATOR 8.5 (GLOVE) ×2
GLOVE ECLIPSE 6.5 STRL STRAW (GLOVE) ×2 IMPLANT
GLOVE EXAM NITRILE LRG STRL (GLOVE) ×2 IMPLANT
GLOVE SURG ORTHO 8.0 STRL STRW (GLOVE) ×3 IMPLANT
GOWN STRL REUS W/ TWL LRG LVL3 (GOWN DISPOSABLE) ×1 IMPLANT
GOWN STRL REUS W/ TWL XL LVL3 (GOWN DISPOSABLE) IMPLANT
GOWN STRL REUS W/TWL LRG LVL3 (GOWN DISPOSABLE) ×3
GOWN STRL REUS W/TWL XL LVL3 (GOWN DISPOSABLE) ×6 IMPLANT
LOOP VESSEL MAXI BLUE (MISCELLANEOUS) IMPLANT
NDL KEITH (NEEDLE) IMPLANT
NEEDLE 27GAX1X1/2 (NEEDLE) ×2 IMPLANT
NEEDLE HYPO 22GX1.5 SAFETY (NEEDLE) IMPLANT
NEEDLE KEITH (NEEDLE) IMPLANT
NS IRRIG 1000ML POUR BTL (IV SOLUTION) ×3 IMPLANT
PACK BASIN DAY SURGERY FS (CUSTOM PROCEDURE TRAY) ×3 IMPLANT
PAD CAST 3X4 CTTN HI CHSV (CAST SUPPLIES) ×1 IMPLANT
PAD CAST 4YDX4 CTTN HI CHSV (CAST SUPPLIES) IMPLANT
PADDING CAST ABS 3INX4YD NS (CAST SUPPLIES)
PADDING CAST ABS 4INX4YD NS (CAST SUPPLIES) ×2
PADDING CAST ABS COTTON 3X4 (CAST SUPPLIES) IMPLANT
PADDING CAST ABS COTTON 4X4 ST (CAST SUPPLIES) ×1 IMPLANT
PADDING CAST COTTON 3X4 STRL (CAST SUPPLIES) ×3
PADDING CAST COTTON 4X4 STRL (CAST SUPPLIES)
SLEEVE SCD COMPRESS KNEE MED (MISCELLANEOUS) ×2 IMPLANT
SPLINT PLASTER CAST XFAST 3X15 (CAST SUPPLIES) IMPLANT
SPLINT PLASTER XTRA FASTSET 3X (CAST SUPPLIES) ×20
STOCKINETTE 4X48 STRL (DRAPES) ×3 IMPLANT
SUT CHROMIC 5 0 P 3 (SUTURE) IMPLANT
SUT ETHIBOND 3-0 V-5 (SUTURE) IMPLANT
SUT MERSILENE 2.0 SH NDLE (SUTURE) IMPLANT
SUT MERSILENE 3 0 FS 1 (SUTURE) IMPLANT
SUT MERSILENE 4 0 P 3 (SUTURE) ×2 IMPLANT
SUT POLY BUTTON 15MM (SUTURE) IMPLANT
SUT PROLENE 2 0 SH DA (SUTURE) IMPLANT
SUT SILK 4 0 PS 2 (SUTURE) IMPLANT
SUT STEEL 3 0 (SUTURE) IMPLANT
SUT STEEL 4 0 V 26 (SUTURE) IMPLANT
SUT VIC AB 3-0 PS1 18 (SUTURE)
SUT VIC AB 3-0 PS1 18XBRD (SUTURE) IMPLANT
SUT VIC AB 4-0 P-3 18XBRD (SUTURE) IMPLANT
SUT VIC AB 4-0 P2 18 (SUTURE) ×2 IMPLANT
SUT VIC AB 4-0 P3 18 (SUTURE) ×3
SUT VICRYL 4-0 PS2 18IN ABS (SUTURE) IMPLANT
SUT VICRYL RAPID 5 0 P 3 (SUTURE) IMPLANT
SUT VICRYL RAPIDE 4/0 PS 2 (SUTURE) ×5 IMPLANT
SYR BULB 3OZ (MISCELLANEOUS) ×3 IMPLANT
SYR CONTROL 10ML LL (SYRINGE) ×2 IMPLANT
TOWEL OR 17X24 6PK STRL BLUE (TOWEL DISPOSABLE) ×4 IMPLANT
TUBE FEEDING 5FR 15 INCH (TUBING) IMPLANT
UNDERPAD 30X30 INCONTINENT (UNDERPADS AND DIAPERS) ×1 IMPLANT

## 2013-12-21 NOTE — Anesthesia Postprocedure Evaluation (Signed)
  Anesthesia Post-op Note  Patient: Steven Guzman  Procedure(s) Performed: Procedure(s): TENOLYSIS FLEXOR CARPI RADIALIS ,DEBRIDEMENT RIGHT JOINT WRIST,DEBRIDEMENT SCAPHOTRAPEZIAL TRAPEZOID, REPAIR OF EXTENSOR HOOD (Right)  Patient Location: PACU  Anesthesia Type: MAC, Bier Block   Level of Consciousness: awake, alert  and oriented  Airway and Oxygen Therapy: Patient Spontanous Breathing  Post-op Pain: mild  Post-op Assessment: Post-op Vital signs reviewed  Post-op Vital Signs: Reviewed  Last Vitals:  Filed Vitals:   12/21/13 1400  BP: 107/60  Pulse: 61  Temp:   Resp: 16    Complications: No apparent anesthesia complications

## 2013-12-21 NOTE — Anesthesia Procedure Notes (Signed)
Procedure Name: MAC Date/Time: 12/21/2013 11:25 AM Performed by: Maryella Shivers Pre-anesthesia Checklist: Patient identified, Timeout performed, Emergency Drugs available, Suction available and Patient being monitored Patient Re-evaluated:Patient Re-evaluated prior to inductionOxygen Delivery Method: Simple face mask Preoxygenation: Pre-oxygenation with 100% oxygen

## 2013-12-21 NOTE — Discharge Instructions (Addendum)
°  Post Anesthesia Home Care Instructions  Activity: Get plenty of rest for the remainder of the day. A responsible adult should stay with you for 24 hours following the procedure.  For the next 24 hours, DO NOT: -Drive a car -Paediatric nurse -Drink alcoholic beverages -Take any medication unless instructed by your physician -Make any legal decisions or sign important papers.  Meals: Start with liquid foods such as gelatin or soup. Progress to regular foods as tolerated. Avoid greasy, spicy, heavy foods. If nausea and/or vomiting occur, drink only clear liquids until the nausea and/or vomiting subsides. Call your physician if vomiting continues.  Special Instructions/Symptoms: Your throat may feel dry or sore from the anesthesia or the breathing tube placed in your throat during surgery. If this causes discomfort, gargle with warm salt water. The discomfort should disappear within 24 hours. [facility] Instructions Hand Surgery  Wound Care: Keep your hand elevated above the level of your heart.  Do not allow it to dangle by your side.  Keep the dressing dry and do not remove it unless your doctor advises you to do so.  He will usually change it at the time of your post-op visit.  Moving your fingers is advised to stimulate circulation but will depend on the site of your surgery.  If you have a splint applied, your doctor will advise you regarding movement.  Activity: Do not drive or operate machinery today.  Rest today and then you may return to your normal activity and work as indicated by your physician.  Diet:  Drink liquids today or eat a light diet.  You may resume a regular diet tomorrow.    General expectations: Pain for two to three days. Fingers may become slightly swollen.  Call your doctor if any of the following occur: Severe pain not relieved by pain medication. Elevated temperature. Dressing soaked with blood. Inability to move fingers. White or bluish color to  fingers.    Post Anesthesia Home Care Instructions  Activity: Get plenty of rest for the remainder of the day. A responsible adult should stay with you for 24 hours following the procedure.  For the next 24 hours, DO NOT: -Drive a car -Paediatric nurse -Drink alcoholic beverages -Take any medication unless instructed by your physician -Make any legal decisions or sign important papers.  Meals: Start with liquid foods such as gelatin or soup. Progress to regular foods as tolerated. Avoid greasy, spicy, heavy foods. If nausea and/or vomiting occur, drink only clear liquids until the nausea and/or vomiting subsides. Call your physician if vomiting continues.  Special Instructions/Symptoms: Your throat may feel dry or sore from the anesthesia or the breathing tube placed in your throat during surgery. If this causes discomfort, gargle with warm salt water. The discomfort should disappear within 24 hours.   Call your surgeon if you experience:   1.  Fever over 101.0. 2.  Inability to urinate. 3.  Nausea and/or vomiting. 4.  Extreme swelling or bruising at the surgical site. 5.  Continued bleeding from the incision. 6.  Increased pain, redness or drainage from the incision. 7.  Problems related to your pain medication. 8. Any change in color, movement and/or sensation 9. Any problems and/or concerns

## 2013-12-21 NOTE — Op Note (Signed)
Dictation Number [id number]

## 2013-12-21 NOTE — Transfer of Care (Signed)
Immediate Anesthesia Transfer of Care Note  Patient: Steven Guzman  Procedure(s) Performed: Procedure(s): TENOLYSIS FLEXOR CARPI RADIALIS ,DEBRIDEMENT RIGHT JOINT WRIST,DEBRIDEMENT SCAPHOTRAPEZIAL TRAPEZOID, REPAIR OF EXTENSOR HOOD (Right)  Patient Location: PACU  Anesthesia Type:MAC and Bier block  Level of Consciousness: awake, alert  and oriented  Airway & Oxygen Therapy: Patient Spontanous Breathing and Patient connected to face mask oxygen  Post-op Assessment: Report given to PACU RN and Post -op Vital signs reviewed and stable  Post vital signs: Reviewed and stable  Complications: No apparent anesthesia complications

## 2013-12-21 NOTE — Brief Op Note (Signed)
12/21/2013  12:31 PM  PATIENT:  Steven Guzman  73 y.o. male  PRE-OPERATIVE DIAGNOSIS:  SCAPHOTRAPEXIAL TRAPEZOID JOINT,RIGHT WRIST FLEXOR CARPI RADIALIS TENDONITIS  POST-OPERATIVE DIAGNOSIS:  SCAPHOTRAPEXIAL TRAPEZOID JOINT,RIGHT WRIST FLEXOR CARPI RADIALIS TENDONITIS  PROCEDURE:  Procedure(s): TENOLYSIS FLEXOR CARPI RADIALIS ,DEBRIDEMENT RIGHT JOINT WRIST,DEBRIDEMENT SCAPHOTRAPEZIAL TRAPEZOID, REPAIR OF EXTENSOR HOOD (Right)  SURGEON:  Surgeon(s) and Role:    * Daryll Brod, MD - Primary  PHYSICIAN ASSISTANT:   ASSISTANTS: none   ANESTHESIA:   local and regional  EBL:     BLOOD ADMINISTERED:none  DRAINS: none   LOCAL MEDICATIONS USED:  BUPIVICAINE   SPECIMEN:  No Specimen  DISPOSITION OF SPECIMEN:  N/A  COUNTS:  YES  TOURNIQUET:   Total Tourniquet Time Documented: Upper Arm (Right) - 58 minutes Total: Upper Arm (Right) - 58 minutes   DICTATION: .Other Dictation: Dictation Number [id number]  PLAN OF CARE: Discharge to home after PACU  PATIENT DISPOSITION:  PACU - hemodynamically stable.

## 2013-12-21 NOTE — H&P (Signed)
Steven Guzman is a 73 year-old right-hand dominant male with pain and mass on the radial aspect of his right wrist, volar side.  He states this has been present for approximately a couple of months, increasing in size. He has no history of injury.  He has no history of diabetes, thyroid problems, arthritis or gout.  He complains of pain with use.  He is not complaining of any numbness or tingling. He states he has a constant, moderate aching pain.  He states that any use of his wrist causes discomfort for him.   He is complaining of catching of his ring finger, right hand .He has had his CT scan done in that he could not have an MRI done. This reveals that he indeed has changes in his flexor carpi radialis tendon which is barely visible. He has [place] arthritis.   ALLERGIES:   Sulfa.  MEDICATIONS:    Spiriva, amiodarone, furosemide, spironolactone, gabapentin, vitamin B-12, benazepril, fish oil, nitroglycerin, Lipitor, Ipratropium-Albuterol, choline fenofibrate, Coreg, BuSpar, Paxil, Prilosec, Flomax and aspirin.  He has been on Plavix, but this has been removed.  SURGICAL HISTORY:     Foot surgery, hemorrhoid surgery, detached retina.  FAMILY MEDICAL HISTORY:    Positive for diabetes, heart disease, high blood pressure and arthritis.  SOCIAL HISTORY:     He smokes a pack a day and is advised to quit with the reasons behind this.  He drinks socially.  He is married and retired.   REVIEW OF SYSTEMS:    Positive for glasses, high blood pressure, heart attack, shortness of breath, depression, easy bruising, otherwise negative.    Steven Guzman is an 73 y.o. male.   Chief Complaint: FCR tendonoitis with subluxating extensor tendon right ring finger HPI: see above  Past Medical History  Diagnosis Date  . Hypertension   . Hyperlipidemia   . COPD (chronic obstructive pulmonary disease)   . GERD (gastroesophageal reflux disease)   . Diverticulitis   . CAD (coronary artery disease)   . ICD  (implantable cardioverter-defibrillator) battery depletion   . Myocardial infarction   . Automatic implantable cardioverter-defibrillator in situ   . Depression   . Aneurysm     abdominal <4    Past Surgical History  Procedure Laterality Date  . Hernia repair    . Foot surgery      lt  . Hemorrhoid banding    . Retinal detachment surgery      right  . Angioplasty    . Cardiac catheterization    . Nose surgery      skin cancer-mohs-skin graft  . Colonoscopy      Family History  Problem Relation Age of Onset  . Heart attack Brother   . CAD Father   . CAD Mother    Social History:  reports that he has been smoking Cigarettes.  He has a 27.5 pack-year smoking history. He has never used smokeless tobacco. He reports that he drinks alcohol. He reports that he does not use illicit drugs.  Allergies:  Allergies  Allergen Reactions  . Sulfa Antibiotics     hives    No prescriptions prior to admission    No results found for this or any previous visit (from the past 48 hour(s)).  No results found.   Pertinent items are noted in HPI.  Height 6' (1.829 m), weight 101.606 kg (224 lb).  General appearance: alert, cooperative and appears stated age Head: Normocephalic, without obvious abnormality Neck: no JVD Resp:  clear to auscultation bilaterally Cardio: regular rate and rhythm, S1, S2 normal, no murmur, click, rub or gallop GI: soft, non-tender; bowel sounds normal; no masses,  no organomegaly Extremities: swelling FCR sheath right trigger right ring with subluxating extensor tendon Pulses: 2+ and symmetric Skin: Skin color, texture, turgor normal. No rashes or lesions Neurologic: Grossly normal Incision/Wound: na  Assessment/Plan RADIOGRAPHS:    X-rays reveal STT arthritis.   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   Subluxating extensor tendo right ring finger  RECOMMENDATIONS/PLAN :  We have discussed the possibility of tenolysis  of the flexor carpi radialis right wrist with debridement of the STT joint as dictated by findings. Reconstruction extensor hood right ring finger.The pre, peri and post op course are discussed along with risks and complications. He is aware there is no guarantee with surgery, possibility of infection, recurrence, injury to arteries, nerves and tendons, incomplete relief of symptoms and dystrophy.  Questions were invited and answered to the patient's satisfaction.  We will schedule this as an outpatient under regional anesthesia.  Milcah Dulany R 12/21/2013, 9:06 AM

## 2013-12-21 NOTE — Anesthesia Preprocedure Evaluation (Signed)
Anesthesia Evaluation    Airway Mallampati: I TM Distance: >3 FB Neck ROM: Full    Dental  (+) Teeth Intact, Dental Advisory Given   Pulmonary Current Smoker,  breath sounds clear to auscultation        Cardiovascular hypertension, Pt. on medications and Pt. on home beta blockers + CAD and + Past MI Rhythm:Regular Rate:Normal     Neuro/Psych    GI/Hepatic GERD-  Medicated and Controlled,  Endo/Other    Renal/GU      Musculoskeletal   Abdominal   Peds  Hematology   Anesthesia Other Findings   Reproductive/Obstetrics                           Anesthesia Physical Anesthesia Plan  ASA: III  Anesthesia Plan: MAC and Bier Block   Post-op Pain Management:    Induction: Intravenous  Airway Management Planned: Simple Face Mask  Additional Equipment:   Intra-op Plan:   Post-operative Plan:   Informed Consent: I have reviewed the patients History and Physical, chart, labs and discussed the procedure including the risks, benefits and alternatives for the proposed anesthesia with the patient or authorized representative who has indicated his/her understanding and acceptance.   Dental advisory given  Plan Discussed with: CRNA and Anesthesiologist  Anesthesia Plan Comments:         Anesthesia Quick Evaluation

## 2013-12-22 ENCOUNTER — Encounter (HOSPITAL_BASED_OUTPATIENT_CLINIC_OR_DEPARTMENT_OTHER): Payer: Self-pay | Admitting: Orthopedic Surgery

## 2013-12-22 NOTE — Op Note (Signed)
NAME:  KEYMARION, BEARMAN NO.:  [id number]  MEDICAL RECORD NO.:  [id number]  LOCATION:                                 FACILITY:  PHYSICIAN:  Daryll Brod, M.D.            DATE OF BIRTH:  DATE OF PROCEDURE:  12/21/2013 DATE OF DISCHARGE:                              OPERATIVE REPORT   PREOPERATIVE DIAGNOSIS:  Flexor carpi radialis tendinitis with STT arthritis and subluxating extensor tendon, right ring finger.  POSTOPERATIVE DIAGNOSIS:  Flexor carpi radialis tendinitis with STT arthritis and subluxating extensor tendon, right ring finger.  OPERATION:  Debridement of STT joint with tenolysis reattachment of the flexor carpi radialis tendon to the volar capsule of the STT joint with reconstruction extensor hood, right ring finger, right wrist.  SURGEON:  Daryll Brod, M.D.  ASSISTANT:  None.  ANESTHESIA:  At full upper arm IV regional with local infiltration.  ANESTHESIOLOGIST:  Lorrene Reid, M.D.  HISTORY:  The patient is a 73 year old male with a history of swelling and pain of his right wrist, volar radial aspect.  CT scan reveals a significant tendinitis of the flexor carpi radialis was CMC arthritis, and STT arthritis.  He also has subluxation of the extensor tendon of his right ring finger.  He is desirous of a debridement of the flexor carpi radialis tendon with MCP joint, and reconstruction extensor hood, right ring finger.  He is aware that there is no guarantee with the surgery, possibility of infection, recurrence of injury to arteries, nerves, tendons, incomplete relief of symptoms, dystrophy.  In preoperative area, the patient is seen, the extremity marked by both patient and surgeon.  Antibiotic given.  PROCEDURE IN DETAIL:  The patient was brought to the operating room, where an upper arm IV regional anesthetic was carried out without difficulty to the right upper arm placed in supine position, prepped and draped using ChloraPrep.  A 3-minute dry  time was allowed.  Time-out taken, confirming the patient and procedure.  The longitudinal incision was made on the volar radial aspect of the wrist directly over the flexor carpi radialis tendon, carried down through subcutaneous tissue. Bleeders were electrocauterized with bipolar.  The flexor tendon of the radial wrist flexor was noted be partially ruptured.  This was followed distally.  An area of eburnation of bone with loss of capsular structure at the level of the STT joint was noted.  This area was open debrided irrigated.  The capsule closed as much as possible with figure-of-eight 4-0 Vicryl sutures.  The flexor carpi radialis tendon was then sutured to the distal portion of the capsule of the STT joint volarly over the scaphoid.  This was done with 4-0 Mersilene sutures in a modified Kessler and multiple figure-of-eight sutures to stabilize the tendon. This was partially ruptured.  The ruptured portion was debrided with sharp dissection.  The wound was irrigated.  The subcutaneous tissue was closed with interrupted 4-0 Vicryl and skin with interrupted 4-0 Vicryl Rapide sutures.  A separate incision was then made dorsally over the metacarpophalangeal joint, metacarpal of his ring finger right hand carried down through subcutaneous tissue.  Bleeders again electrocauterized with bipolar.  The juncture up to the small finger was noted.  There was accessory tendon to the ring finger on the ulnar aspect with a component coming from the adductor digiti quinti.  This was harvested and in its distal half.  This was used to weed through the extensor tendon.  Central tendon brought beneath the intermetacarpal ligament with a right-angle hemostat brought out distally and passed through the tendon again this was then sutured into position with flexion and extension of his finger passively.  No subluxation to the extensor was noted.  The tendon was then tied and reinforced with several other  figure-of-eight 4-0 Mersilene sutures full stabilizing the extensor hood.  This wound was irrigated.  The skin then closed with interrupted 4-0 Vicryl Rapide.  Bleeders were again electrocauterized during the procedure with bipolar.  A sterile compressive dressing, volar splint to the wrist and all fingers was applied.  On deflation of the tourniquet, all fingers immediately pinked.  He was taken to the recovery room for observation in satisfactory condition.  He will be discharged home to return to the [employer] in 1 week on Vicodin.          ______________________________ Daryll Brod, M.D.     GK/MEDQ  D:  12/21/2013  T:  12/22/2013  Job:  [id number]

## 2013-12-25 ENCOUNTER — Other Ambulatory Visit (INDEPENDENT_AMBULATORY_CARE_PROVIDER_SITE_OTHER): Payer: Medicare Other

## 2013-12-25 ENCOUNTER — Other Ambulatory Visit: Payer: Self-pay

## 2013-12-25 ENCOUNTER — Encounter: Payer: Self-pay | Admitting: Pulmonary Disease

## 2013-12-25 ENCOUNTER — Ambulatory Visit (INDEPENDENT_AMBULATORY_CARE_PROVIDER_SITE_OTHER): Payer: Medicare Other | Admitting: Pulmonary Disease

## 2013-12-25 VITALS — BP 102/80 | HR 58 | Ht 69.0 in | Wt 216.0 lb

## 2013-12-25 DIAGNOSIS — J449 Chronic obstructive pulmonary disease, unspecified: Secondary | ICD-10-CM | POA: Insufficient documentation

## 2013-12-25 DIAGNOSIS — J438 Other emphysema: Secondary | ICD-10-CM | POA: Diagnosis not present

## 2013-12-25 DIAGNOSIS — J309 Allergic rhinitis, unspecified: Secondary | ICD-10-CM | POA: Insufficient documentation

## 2013-12-25 DIAGNOSIS — I2510 Atherosclerotic heart disease of native coronary artery without angina pectoris: Secondary | ICD-10-CM

## 2013-12-25 DIAGNOSIS — J3089 Other allergic rhinitis: Secondary | ICD-10-CM | POA: Diagnosis not present

## 2013-12-25 DIAGNOSIS — J441 Chronic obstructive pulmonary disease with (acute) exacerbation: Secondary | ICD-10-CM | POA: Diagnosis not present

## 2013-12-25 DIAGNOSIS — F17200 Nicotine dependence, unspecified, uncomplicated: Secondary | ICD-10-CM | POA: Diagnosis not present

## 2013-12-25 DIAGNOSIS — Z23 Encounter for immunization: Secondary | ICD-10-CM

## 2013-12-25 DIAGNOSIS — Z720 Tobacco use: Secondary | ICD-10-CM

## 2013-12-25 LAB — CBC WITH DIFFERENTIAL/PLATELET
BASOS PCT: 0.3 % (ref 0.0–3.0)
Basophils Absolute: 0 10*3/uL (ref 0.0–0.1)
EOS PCT: 0.9 % (ref 0.0–5.0)
Eosinophils Absolute: 0.1 10*3/uL (ref 0.0–0.7)
HEMATOCRIT: 45.4 % (ref 39.0–52.0)
HEMOGLOBIN: 15 g/dL (ref 13.0–17.0)
LYMPHS ABS: 1.8 10*3/uL (ref 0.7–4.0)
Lymphocytes Relative: 14.6 % (ref 12.0–46.0)
MCHC: 33 g/dL (ref 30.0–36.0)
MCV: 95.7 fl (ref 78.0–100.0)
MONO ABS: 0.9 10*3/uL (ref 0.1–1.0)
MONOS PCT: 7.2 % (ref 3.0–12.0)
NEUTROS ABS: 9.3 10*3/uL — AB (ref 1.4–7.7)
Neutrophils Relative %: 77 % (ref 43.0–77.0)
Platelets: 227 10*3/uL (ref 150.0–400.0)
RBC: 4.74 Mil/uL (ref 4.22–5.81)
RDW: 14.5 % (ref 11.5–15.5)
WBC: 12.1 10*3/uL — AB (ref 4.0–10.5)

## 2013-12-25 MED ORDER — CHOLINE FENOFIBRATE 135 MG PO CPDR: 135.0000 mg | DELAYED_RELEASE_CAPSULE | Freq: Every day | ORAL | Status: DC

## 2013-12-25 MED ORDER — FLUTICASONE-SALMETEROL 250-50 MCG/DOSE IN AEPB: 1.0000 | INHALATION_SPRAY | Freq: Two times a day (BID) | RESPIRATORY_TRACT | Status: DC

## 2013-12-25 NOTE — Telephone Encounter (Signed)
Refill rqst for Tripilix rcvd from pt via My Chart. Rx sent to pt pharmacy 30tab R-8

## 2013-12-25 NOTE — Assessment & Plan Note (Signed)
Take generic Zyrtec

## 2013-12-25 NOTE — Assessment & Plan Note (Signed)
This is the primary reason for his recurrent exacerbations of COPD. Unfortunately his insurance company does not pay for nicotine replacement.  Plan: -He will try electronic cigarette and plans to enroll in a class at [facility] for smoking cessation

## 2013-12-25 NOTE — Patient Instructions (Signed)
Quit smoking! Try using an electronic cigarette Keep using the Advair and the Spiriva We will see you back in 3 months or sooner if needed

## 2013-12-25 NOTE — Assessment & Plan Note (Signed)
Gold grade C based on moderate airflow obstruction but fairly severe symptoms. He has experienced recurrent exacerbations which is most likely due to ongoing smoking. I will check a serum IgE and eosinophil count to see if there is evidence of an underlying allergy contributing. His recent CT chest did not show clear evidence of a concomitant lung disease.  Plan: -Check serum IgE and eosinophil count -Flu shot -Continue Advair and Spiriva - Educated at length to quit smoking -Followup 3 months

## 2013-12-25 NOTE — Progress Notes (Signed)
Subjective:    Patient ID: Steven Guzman, male    DOB: 30-Dec-1940, 73 y.o.   MRN: [id number]  Synopsis: GOLD Grade C COPD 10/2013 PFT> ratio 61%, FEV1 2.53L (77% pred, 15% change), TLC 8.65 L (117% pred), RV 4.25L (161% pred), DLCO 25.39 (73% pred)  HPI  12/25/2013 ROV > Steven Guzman recently had a tendon release surgery last week.  So far his course has not ben complicated. His lungs have only been "fair" recently.  He saw my partner for a flare of his COPD and was treated with prednisone.  He states that his sinuses have been bothering home with runny nose.  He says that his breathing is better on the prednisone.  His insurance wouldn't pay for the pulmonary rehab and nicotrol.  He says that he continues to have some cough with mucus production, no blood. No chest pain or fevers.  His breathing has been better since taking the prednisone.    Past Medical History  Diagnosis Date  . Hypertension   . Hyperlipidemia   . COPD (chronic obstructive pulmonary disease)   . GERD (gastroesophageal reflux disease)   . Diverticulitis   . CAD (coronary artery disease)   . ICD (implantable cardioverter-defibrillator) battery depletion   . Myocardial infarction   . Automatic implantable cardioverter-defibrillator in situ   . Depression   . Aneurysm     abdominal <4     Review of Systems  Constitutional: Negative for fever, chills and fatigue.  HENT: Positive for postnasal drip, rhinorrhea and sinus pressure.   Respiratory: Positive for cough and shortness of breath. Negative for wheezing.   Cardiovascular: Negative for chest pain, palpitations and leg swelling.       Objective:   Physical Exam Filed Vitals:   12/25/13 1506  BP: 102/80  Pulse: 58  Height: 5\' 9"  (1.753 m)  Weight: 216 lb (97.977 kg)  SpO2: 92%   RA  Gen: well appearing, no acute distress HEENT: NCAT, EOMi, OP clear PULM: CTA B CV: RRR, no mgr, no JVD AB: BS+, soft, nontender Ext: warm, no edema, no clubbing, no  cyanosis Derm: no rash or skin breakdown Neuro: A&Ox4, MAEW     Assessment & Plan:   COPD (chronic obstructive pulmonary disease) Gold grade C based on moderate airflow obstruction but fairly severe symptoms. He has experienced recurrent exacerbations which is most likely due to ongoing smoking. I will check a serum IgE and eosinophil count to see if there is evidence of an underlying allergy contributing. His recent CT chest did not show clear evidence of a concomitant lung disease.  Plan: -Check serum IgE and eosinophil count -Flu shot -Continue Advair and Spiriva - Educated at length to quit smoking -Followup 3 months  Tobacco abuse This is the primary reason for his recurrent exacerbations of COPD. Unfortunately his insurance company does not pay for nicotine replacement.  Plan: -He will try electronic cigarette and plans to enroll in a class at [facility] for smoking cessation  Allergic rhinitis Take generic Zyrtec    Updated Medication List Outpatient Encounter Prescriptions as of 12/25/2013  Medication Sig  . amiodarone (PACERONE) 200 MG tablet Take 1 tablet (200 mg total) by mouth daily.  [name] aspirin 81 MG tablet Take 81 mg by mouth daily.  [name] atorvastatin (LIPITOR) 80 MG tablet Take 80 mg by mouth daily.  . benazepril (LOTENSIN) 10 MG tablet Take 10 mg by mouth daily.  . busPIRone (BUSPAR) 15 MG tablet Take 15  mg by mouth 2 (two) times daily.  . carvedilol (COREG) 12.5 MG tablet Take 12.5 mg by mouth 2 (two) times daily with a meal.  . Choline Fenofibrate (TRILIPIX) 135 MG capsule Take 1 capsule (135 mg total) by mouth daily.  [name] GABAPENTIN PO Take by mouth. 3 CAPSULES TWICE DAILY  . HYDROcodone-acetaminophen (NORCO) 10-325 MG per tablet Take 1 tablet by mouth every 6 (six) hours as needed.  . Ipratropium-Albuterol (COMBIVENT RESPIMAT) 20-100 MCG/ACT AERS respimat Inhale 1 puff into the lungs every 6 (six) hours as needed.   . nitroGLYCERIN (NITROSTAT) 0.4 MG SL  tablet Place 1 tablet (0.4 mg total) under the tongue every 5 (five) minutes as needed for chest pain.  [name] omeprazole (PRILOSEC) 20 MG capsule Take 20 mg by mouth 2 (two) times daily.  [name] PARoxetine (PAXIL) 40 MG tablet Take 40 mg by mouth every morning.  [name] spironolactone (ALDACTONE) 25 MG tablet Take 1 tablet (25 mg total) by mouth daily.  . tamsulosin (FLOMAX) 0.4 MG CAPS capsule Take by mouth daily after supper.   . tiotropium (SPIRIVA) 18 MCG inhalation capsule Place 1 capsule (18 mcg total) into inhaler and inhale daily.  . vitamin B-12 (CYANOCOBALAMIN) 1000 MCG tablet Take 1,000 mcg by mouth daily.  . furosemide (LASIX) 40 MG tablet Take 0.5 tablets by mouth daily.  . Omega-3 Fatty Acids (FISH OIL) 1000 MG CAPS Take by mouth.  . [DISCONTINUED] predniSONE (DELTASONE) 10 MG tablet Take 4 tabs daily x 2 days, 3 tabs daily x 2 days, 2 tabs daily x 2 days, 1 tab daily x 2 days then stop

## 2014-01-11 ENCOUNTER — Other Ambulatory Visit: Payer: Self-pay | Admitting: Interventional Cardiology

## 2014-02-06 ENCOUNTER — Ambulatory Visit (INDEPENDENT_AMBULATORY_CARE_PROVIDER_SITE_OTHER): Payer: Medicare Other | Admitting: Internal Medicine

## 2014-02-06 ENCOUNTER — Encounter: Payer: Self-pay | Admitting: Internal Medicine

## 2014-02-06 VITALS — BP 118/60 | HR 61 | Temp 98.4°F | Ht 71.0 in | Wt 225.0 lb

## 2014-02-06 DIAGNOSIS — J449 Chronic obstructive pulmonary disease, unspecified: Secondary | ICD-10-CM

## 2014-02-06 DIAGNOSIS — I2510 Atherosclerotic heart disease of native coronary artery without angina pectoris: Secondary | ICD-10-CM | POA: Diagnosis not present

## 2014-02-06 DIAGNOSIS — Z720 Tobacco use: Secondary | ICD-10-CM

## 2014-02-06 DIAGNOSIS — F17200 Nicotine dependence, unspecified, uncomplicated: Secondary | ICD-10-CM | POA: Diagnosis not present

## 2014-02-06 DIAGNOSIS — F17210 Nicotine dependence, cigarettes, uncomplicated: Secondary | ICD-10-CM

## 2014-02-06 MED ORDER — AZITHROMYCIN 250 MG PO TABS: ORAL_TABLET | ORAL | Status: DC

## 2014-02-06 MED ORDER — PREDNISONE 10 MG PO TABS: ORAL_TABLET | ORAL | Status: DC

## 2014-02-06 MED ORDER — BUDESONIDE-FORMOTEROL FUMARATE 160-4.5 MCG/ACT IN AERO: INHALATION_SPRAY | RESPIRATORY_TRACT | Status: DC

## 2014-02-06 NOTE — Progress Notes (Signed)
Subjective:    Patient ID: Steven Guzman, male    DOB: 1941-02-27.   MRN: [id number]  Synopsis: GOLD Grade C COPD 10/2013 PFT> ratio 61%, FEV1 2.53L (77% pred, 15% change), TLC 8.65 L (117% pred), RV 4.25L (161% pred), DLCO 25.39 (73% pred)  HPI  12/25/2013 ROV > Mr. Tanney recently had a tendon release surgery last week.  So far his course has not ben complicated. His lungs have only been "fair" recently.  He saw my partner for a flare of his COPD and was treated with prednisone.  He states that his sinuses have been bothering home with runny nose.  He says that his breathing is better on the prednisone.  His insurance wouldn't pay for the pulmonary rehab and nicotrol.  He says that he continues to have some cough with mucus production, no blood. No chest pain or fevers.  His breathing has been better since taking the prednisone.   rec Quit smoking! Try using an electronic cigarette Keep using the Advair and the Spiriva   02/06/2014 acute  ov/Cung Masterson re: GOLD I aecopd while on spiriva/advair/ acei  / still smoking Chief Complaint  Patient presents with  . Acute Visit    Pt c/o increased cough- prod with thick, yellow sputum x 2 days. He states that his breathing is unchanged, but has noticed some wheezing.   flare of cough/wheeze / sob really started on 11/6 and gradually worse cough/ subjective wheeze and need for rescue combivent / worse in ams  No overt sinus or hb symptoms. No unusual exp hx or h/o childhood pna/ asthma or knowledge of premature birth.    Also denies any obvious fluctuation of symptoms with weather or environmental changes or other aggravating or alleviating factors except as outlined above   Current Medications, Allergies, Complete Past Medical History, Past Surgical History, Family History, and Social History were reviewed in [employer] record.  ROS  The following are not active complaints unless bolded sore throat, dysphagia, dental  problems, itching, sneezing,  nasal congestion or excess/ purulent secretions, ear ache,   fever, chills, sweats, unintended wt loss, pleuritic or exertional cp, hemoptysis,  orthopnea pnd or leg swelling, presyncope, palpitations, heartburn, abdominal pain, anorexia, nausea, vomiting, diarrhea  or change in bowel or urinary habits, change in stools or urine, dysuria,hematuria,  rash, arthralgias, visual complaints, headache, numbness weakness or ataxia or problems with walking or coordination,  change in mood/affect or memory.         Past Medical History  Diagnosis Date  . Hypertension   . Hyperlipidemia   . COPD (chronic obstructive pulmonary disease)   . GERD (gastroesophageal reflux disease)   . Diverticulitis   . CAD (coronary artery disease)   . ICD (implantable cardioverter-defibrillator) battery depletion   . Myocardial infarction   . Automatic implantable cardioverter-defibrillator in situ   . Depression   . Aneurysm     abdominal <4           Objective:   Physical Exam  amb pleasant wm with gruff voice   Wt Readings from Last 3 Encounters:  02/06/14 225 lb (102.059 kg)  12/25/13 216 lb (97.977 kg)  12/21/13 216 lb 8 oz (98.204 kg)    Vital signs reviewed   Gen: well appearing, no acute distress HEENT: NCAT, EOMi, OP clear PULM: insp and exp rhonchi bilaterally/ with exp cough CV: RRR, no mgr, no JVD AB: BS+, soft, nontender Ext: warm, no edema, no clubbing, no  cyanosis Derm: no rash or skin breakdown Neuro: A&Ox4, MAEW     Assessment & Plan:

## 2014-02-06 NOTE — Patient Instructions (Addendum)
Stop advair   Try symbicort 160 Take 2 puffs first thing in am and then another 2 puffs about 12 hours later (in place of advair   Work on perfecting  inhaler technique:  relax and gently blow all the way out then take a nice smooth deep breath back in, triggering the inhaler at same time you start breathing in.  Hold for up to 5 seconds if you can.  Rinse and gargle with water when done     Prednisone 10 mg take  4 each am x 2 days,   2 each am x 2 days,  1 each am x 2 days and stop   zpak should turn the mucus back to white   You are on multiple medications that could be causing you breathing problems - some of these medications may need to be changed going forward but for now would not recommend you change any   See Dr Lake Bells in 2 weeks

## 2014-02-07 NOTE — Assessment & Plan Note (Signed)
>   3 min discussion I reviewed the Flethcher curve with patient that basically indicates  if you quit smoking when your best day FEV1 is still well preserved (as is the case here by his most recent pfts)  it is highly unlikely you will progress to severe disease and informed the patient there was no medication on the market that has proven to change the curve or the likelihood of progression.  Therefore stopping smoking and maintaining abstinence is the most important aspect of care, not choice of inhalers or for that matter, doctors.

## 2014-02-07 NOTE — Assessment & Plan Note (Addendum)
10/2013 PFT> ratio 61%, FEV1 2.53L (77% pred, 15% change), TLC 8.65 L (117% pred), RV 4.25L (161% pred), DLCO 25.39 (73% pred) - 02/06/2014 p extensive coaching HFA effectiveness =    75% > try symbicort 160 2bid in place of advair   Comment: Note striking difference between the pfts the number of pulmonary meds he takes chronically to control his symptoms c/w difficult to control airways disorderd and now flaring on advair and spiriva and freq combivent  DDX of  difficult airways management all start with A and  include Adherence, Ace Inhibitors, Acid Reflux, Active Sinus Disease, Alpha 1 Antitripsin deficiency, Anxiety masquerading as Airways dz,  ABPA,  allergy(esp in young), Aspiration (esp in elderly), Adverse effects of DPI,  Active smokers, plus two Bs  = Bronchiectasis and Beta blocker use..and one C= CHF  Adherence is always the initial "prime suspect" and is a multilayered concern that requires a "trust but verify" approach in every patient - starting with knowing how to use medications, especially inhalers, correctly, keeping up with refills and understanding the fundamental difference between maintenance and prns vs those medications only taken for a very short course and then stopped and not refilled.  The proper method of use, as well as anticipated side effects, of a metered-dose inhaler are discussed and demonstrated to the patient. Improved effectiveness after extensive coaching during this visit to a level of approximately  75% so gave him symbicort 160 sample and better in office w/in 5 min of first two doses > rec replace advair  Active smoking discussed separately  ACEi > low threshold to change to ARB but hold off for now unless proves refractory   Adverse effect of dpi, esp advair > change to hfa symbicort  ?BB effect > low threshold to change to a more selective BB  ? chf Lab Results  Component Value Date   PROBNP 36.0 10/09/2013   unlikely at present     Each  maintenance medication was reviewed in detail including most importantly the difference between maintenance and as needed and under what circumstances the prns are to be used.  Please see instructions for details which were reviewed in writing and the patient given a copy.

## 2014-02-20 ENCOUNTER — Encounter: Payer: Self-pay | Admitting: Pulmonary Disease

## 2014-02-20 ENCOUNTER — Telehealth: Payer: Self-pay | Admitting: Interventional Cardiology

## 2014-02-20 ENCOUNTER — Ambulatory Visit (INDEPENDENT_AMBULATORY_CARE_PROVIDER_SITE_OTHER): Payer: Medicare Other | Admitting: Pulmonary Disease

## 2014-02-20 VITALS — BP 96/60 | HR 60 | Ht 71.0 in | Wt 226.0 lb

## 2014-02-20 DIAGNOSIS — F17210 Nicotine dependence, cigarettes, uncomplicated: Secondary | ICD-10-CM

## 2014-02-20 DIAGNOSIS — Z720 Tobacco use: Secondary | ICD-10-CM

## 2014-02-20 DIAGNOSIS — J449 Chronic obstructive pulmonary disease, unspecified: Secondary | ICD-10-CM

## 2014-02-20 DIAGNOSIS — I2510 Atherosclerotic heart disease of native coronary artery without angina pectoris: Secondary | ICD-10-CM | POA: Diagnosis not present

## 2014-02-20 DIAGNOSIS — J432 Centrilobular emphysema: Secondary | ICD-10-CM | POA: Diagnosis not present

## 2014-02-20 NOTE — Progress Notes (Signed)
Subjective:    Patient ID: Steven Guzman, male    DOB: December 04, 1940, 73 y.o.   MRN: [id number]  Synopsis: GOLD Grade C COPD, still actively smoking as of November 2015 10/2013 PFT> ratio 61%, FEV1 2.53L (77% pred, 15% change), TLC 8.65 L (117% pred), RV 4.25L (161% pred), DLCO 25.39 (73% pred)  HPI  Chief Complaint  Patient presents with  . Follow-up    Pt c/o sob with exertion, sometimes prod cough with white/clear mucus.    02/20/2014 ROV> Mr. Groene saw my partner recently for cough and he changed his inhaler to Symbicort.  He feels like the cough has improved significantly but his dyspnea persists.  He said that he had been coughing a lot recently with thick mucus and green mucus. He took azithromycin and prednisone which cleared up his mucus.  He said that he was having a "head cold" prior to seeing Dr. Melvyn Novas and that it better.  No sinus drainage right now.  No sinus sprays, occasionally using zyrtec.  Past Medical History  Diagnosis Date  . Hypertension   . Hyperlipidemia   . COPD (chronic obstructive pulmonary disease)   . GERD (gastroesophageal reflux disease)   . Diverticulitis   . CAD (coronary artery disease)   . ICD (implantable cardioverter-defibrillator) battery depletion   . Myocardial infarction   . Automatic implantable cardioverter-defibrillator in situ   . Depression   . Aneurysm     abdominal <4     Review of Systems  Constitutional: Negative for fever, chills and fatigue.  HENT: Negative for postnasal drip, rhinorrhea and sinus pressure.   Respiratory: Negative for cough, shortness of breath and wheezing.   Cardiovascular: Negative for chest pain, palpitations and leg swelling.       Objective:   Physical Exam  Filed Vitals:   02/20/14 1602  BP: 96/60  Pulse: 60  Height: 5\' 11"  (1.803 m)  Weight: 226 lb (102.513 kg)  SpO2: 95%   RA  Gen: well appearing, no acute distress HEENT: NCAT, EOMi, OP clear PULM: mild wheezing bilat, good air  movement CV: RRR, no mgr, no JVD AB: BS+, soft, nontender Ext: warm, no edema, no clubbing, no cyanosis Derm: no rash or skin breakdown Neuro: A&Ox4, MAEW     Assessment & Plan:   COPD GOLD II Mr. Geisel has recovered from his recent exacerbation of COPD. This is due primarily to the fact that he continues to smoke cigarettes. Today we discussed the fact that he needs to quit at length. His recurrent exacerbations portend a poor prognosis. His flu shot is up-to-date.  Plan: -Continue Symbicort as prescribed by my partner -Quit smoking, quit smoking, quit smoking  Cigarette smoker He continues to smoke cigarettes. Today we discussed various treatment options. He prefers to try quit cold [place]. I advised that he try to quit at the first year with his wife. Today we discussed the risks and benefits of lung cancer screening. I explained to him that there is a 25% risk of a false positive result. We discussed the implications of this and the fact that it could lead to an unnecessary biopsy. However I also explained that routine testing of a high risk population (smokers) has been shown to improve lung cancer cure rates. He voiced understanding of this and is willing to proceed.    Updated Medication List Outpatient Encounter Prescriptions as of 02/20/2014  Medication Sig  . amiodarone (PACERONE) 200 MG tablet Take 1 tablet (200 mg total) by  mouth daily.  [name] aspirin 81 MG tablet Take 81 mg by mouth daily.  [name] atorvastatin (LIPITOR) 80 MG tablet Take 80 mg by mouth daily.  . benazepril (LOTENSIN) 10 MG tablet TAKE 1 TABLET BY MOUTH EVERY DAY  . budesonide-formoterol (SYMBICORT) 160-4.5 MCG/ACT inhaler Take 2 puffs first thing in am and then another 2 puffs about 12 hours later.  . busPIRone (BUSPAR) 15 MG tablet Take 15 mg by mouth 2 (two) times daily.  . carvedilol (COREG) 12.5 MG tablet Take 12.5 mg by mouth 2 (two) times daily with a meal.  . Choline Fenofibrate (TRILIPIX) 135 MG capsule  Take 1 capsule (135 mg total) by mouth daily.  [name] GABAPENTIN PO Take by mouth. 3 CAPSULES TWICE DAILY  . HYDROcodone-acetaminophen (NORCO) 10-325 MG per tablet Take 1 tablet by mouth every 6 (six) hours as needed.  . nitroGLYCERIN (NITROSTAT) 0.4 MG SL tablet Place 1 tablet (0.4 mg total) under the tongue every 5 (five) minutes as needed for chest pain.  [name] omeprazole (PRILOSEC) 20 MG capsule Take 20 mg by mouth 2 (two) times daily.  [name] PARoxetine (PAXIL) 40 MG tablet Take 40 mg by mouth every morning.  [name] spironolactone (ALDACTONE) 25 MG tablet Take 1 tablet (25 mg total) by mouth daily.  . tamsulosin (FLOMAX) 0.4 MG CAPS capsule Take by mouth daily after supper.   . tiotropium (SPIRIVA) 18 MCG inhalation capsule Place 1 capsule (18 mcg total) into inhaler and inhale daily.  . vitamin B-12 (CYANOCOBALAMIN) 1000 MCG tablet Take 1,000 mcg by mouth daily.  . furosemide (LASIX) 40 MG tablet Take 0.5 tablets by mouth daily.  . [DISCONTINUED] azithromycin (ZITHROMAX) 250 MG tablet Take 2 on day one then 1 daily x 4 days (Patient not taking: Reported on 02/20/2014)  . [DISCONTINUED] Fluticasone-Salmeterol (ADVAIR DISKUS) 250-50 MCG/DOSE AEPB Inhale 1 puff into the lungs 2 (two) times daily. (Patient not taking: Reported on 02/20/2014)  . [DISCONTINUED] Omega-3 Fatty Acids (FISH OIL) 1000 MG CAPS Take by mouth.  . [DISCONTINUED] predniSONE (DELTASONE) 10 MG tablet Take  4 each am x 2 days,   2 each am x 2 days,  1 each am x 2 days and stop (Patient not taking: Reported on 02/20/2014)

## 2014-02-20 NOTE — Assessment & Plan Note (Signed)
Steven Guzman has recovered from his recent exacerbation of COPD. This is due primarily to the fact that he continues to smoke cigarettes. Today we discussed the fact that he needs to quit at length. His recurrent exacerbations portend a poor prognosis. His flu shot is up-to-date.  Plan: -Continue Symbicort as prescribed by my partner -Quit smoking, quit smoking, quit smoking

## 2014-02-20 NOTE — Telephone Encounter (Signed)
New message           Pt would like Lattie Haw to give him a call

## 2014-02-20 NOTE — Assessment & Plan Note (Signed)
He continues to smoke cigarettes. Today we discussed various treatment options. He prefers to try quit cold [place]. I advised that he try to quit at the first year with his wife. Today we discussed the risks and benefits of lung cancer screening. I explained to him that there is a 25% risk of a false positive result. We discussed the implications of this and the fact that it could lead to an unnecessary biopsy. However I also explained that routine testing of a high risk population (smokers) has been shown to improve lung cancer cure rates. He voiced understanding of this and is willing to proceed.

## 2014-02-20 NOTE — Telephone Encounter (Signed)
Decrease the spironolactone to 12.5 mg daily

## 2014-02-20 NOTE — Patient Instructions (Signed)
We will arrange a lung cancer screening CT for you  Keep taking your medications Quit smoking We will see you back in 3 months or sooner if needed

## 2014-02-20 NOTE — Telephone Encounter (Signed)
Returned pt call.pt reports that he has been having an ongoing issue for several months with low bp. Pt bp this morning @8am  before bp medications was 131/64 bp after medication was 104/63 60bpm Pt reports the following readings 95/58 104/63   92/58 117/65 120/68 88/54 Pt reports that he is dizzy on a daily basis.he has not taken lasix in 2 days because of his low bp.He monitors his weight daily and reports it has been stable.he denies increased sob he does have copd, he has little swelling in his LE. He is negative for any other symptoms.pt adv that Dr.Smith is out of the office today, since this is not acute I will fwd him a message and call back with his recommendation.pt agreeable with plan.

## 2014-02-28 MED ORDER — SPIRONOLACTONE 25 MG PO TABS: 12.5000 mg | ORAL_TABLET | Freq: Every day | ORAL | Status: DC

## 2014-02-28 NOTE — Telephone Encounter (Signed)
Pt aware of Dr.Smith's recommendations.  Decrease the spironolactone to 12.5 mg daily Pt sts that he has been doing well the last couple of days    Pt adv to continue to monitor his BP and to call the office if symptoms persist Pt verbalized understanding.

## 2014-03-01 ENCOUNTER — Ambulatory Visit
Admission: RE | Admit: 2014-03-01 | Discharge: 2014-03-01 | Disposition: A | Discharge status: Home / Self Care | Payer: Medicare Other | Source: Ambulatory Visit | Attending: Pulmonary Disease | Admitting: Pulmonary Disease

## 2014-03-01 DIAGNOSIS — J432 Centrilobular emphysema: Secondary | ICD-10-CM

## 2014-03-08 ENCOUNTER — Encounter (HOSPITAL_COMMUNITY): Payer: Self-pay | Admitting: Interventional Cardiology

## 2014-03-12 DIAGNOSIS — J449 Chronic obstructive pulmonary disease, unspecified: Secondary | ICD-10-CM | POA: Diagnosis not present

## 2014-03-12 DIAGNOSIS — Z8619 Personal history of other infectious and parasitic diseases: Secondary | ICD-10-CM | POA: Diagnosis not present

## 2014-03-12 DIAGNOSIS — I10 Essential (primary) hypertension: Secondary | ICD-10-CM | POA: Diagnosis not present

## 2014-03-12 DIAGNOSIS — R202 Paresthesia of skin: Secondary | ICD-10-CM | POA: Diagnosis not present

## 2014-03-16 ENCOUNTER — Encounter (HOSPITAL_COMMUNITY): Payer: Self-pay | Admitting: Emergency Medicine

## 2014-03-16 ENCOUNTER — Inpatient Hospital Stay (HOSPITAL_COMMUNITY)
Admission: EM | Admit: 2014-03-16 | Discharge: 2014-03-19 | DRG: 193 | Disposition: A | Discharge status: Home / Self Care | Payer: Medicare Other | Attending: Pulmonary Disease | Admitting: Pulmonary Disease

## 2014-03-16 ENCOUNTER — Emergency Department (HOSPITAL_COMMUNITY): Payer: Medicare Other

## 2014-03-16 DIAGNOSIS — Z7982 Long term (current) use of aspirin: Secondary | ICD-10-CM | POA: Diagnosis not present

## 2014-03-16 DIAGNOSIS — R042 Hemoptysis: Secondary | ICD-10-CM | POA: Diagnosis present

## 2014-03-16 DIAGNOSIS — R059 Cough, unspecified: Secondary | ICD-10-CM

## 2014-03-16 DIAGNOSIS — I4901 Ventricular fibrillation: Secondary | ICD-10-CM | POA: Diagnosis present

## 2014-03-16 DIAGNOSIS — Z85828 Personal history of other malignant neoplasm of skin: Secondary | ICD-10-CM | POA: Diagnosis not present

## 2014-03-16 DIAGNOSIS — E785 Hyperlipidemia, unspecified: Secondary | ICD-10-CM | POA: Diagnosis present

## 2014-03-16 DIAGNOSIS — R42 Dizziness and giddiness: Secondary | ICD-10-CM | POA: Diagnosis not present

## 2014-03-16 DIAGNOSIS — K219 Gastro-esophageal reflux disease without esophagitis: Secondary | ICD-10-CM | POA: Diagnosis present

## 2014-03-16 DIAGNOSIS — I472 Ventricular tachycardia, unspecified: Secondary | ICD-10-CM

## 2014-03-16 DIAGNOSIS — Z79899 Other long term (current) drug therapy: Secondary | ICD-10-CM

## 2014-03-16 DIAGNOSIS — Z720 Tobacco use: Secondary | ICD-10-CM | POA: Diagnosis not present

## 2014-03-16 DIAGNOSIS — I252 Old myocardial infarction: Secondary | ICD-10-CM | POA: Diagnosis not present

## 2014-03-16 DIAGNOSIS — R404 Transient alteration of awareness: Secondary | ICD-10-CM | POA: Diagnosis not present

## 2014-03-16 DIAGNOSIS — J441 Chronic obstructive pulmonary disease with (acute) exacerbation: Secondary | ICD-10-CM

## 2014-03-16 DIAGNOSIS — I5022 Chronic systolic (congestive) heart failure: Secondary | ICD-10-CM | POA: Diagnosis present

## 2014-03-16 DIAGNOSIS — Z9119 Patient's noncompliance with other medical treatment and regimen: Secondary | ICD-10-CM | POA: Diagnosis present

## 2014-03-16 DIAGNOSIS — I2510 Atherosclerotic heart disease of native coronary artery without angina pectoris: Secondary | ICD-10-CM | POA: Diagnosis present

## 2014-03-16 DIAGNOSIS — R0602 Shortness of breath: Secondary | ICD-10-CM | POA: Diagnosis not present

## 2014-03-16 DIAGNOSIS — F17210 Nicotine dependence, cigarettes, uncomplicated: Secondary | ICD-10-CM | POA: Diagnosis present

## 2014-03-16 DIAGNOSIS — I10 Essential (primary) hypertension: Secondary | ICD-10-CM | POA: Diagnosis present

## 2014-03-16 DIAGNOSIS — F329 Major depressive disorder, single episode, unspecified: Secondary | ICD-10-CM | POA: Diagnosis present

## 2014-03-16 DIAGNOSIS — J9621 Acute and chronic respiratory failure with hypoxia: Secondary | ICD-10-CM

## 2014-03-16 DIAGNOSIS — I255 Ischemic cardiomyopathy: Secondary | ICD-10-CM | POA: Diagnosis present

## 2014-03-16 DIAGNOSIS — J449 Chronic obstructive pulmonary disease, unspecified: Secondary | ICD-10-CM | POA: Diagnosis present

## 2014-03-16 DIAGNOSIS — J189 Pneumonia, unspecified organism: Principal | ICD-10-CM | POA: Diagnosis present

## 2014-03-16 DIAGNOSIS — R05 Cough: Secondary | ICD-10-CM

## 2014-03-16 DIAGNOSIS — R0902 Hypoxemia: Secondary | ICD-10-CM | POA: Diagnosis present

## 2014-03-16 DIAGNOSIS — Z950 Presence of cardiac pacemaker: Secondary | ICD-10-CM | POA: Diagnosis not present

## 2014-03-16 DIAGNOSIS — Z95810 Presence of automatic (implantable) cardiac defibrillator: Secondary | ICD-10-CM | POA: Diagnosis not present

## 2014-03-16 DIAGNOSIS — R918 Other nonspecific abnormal finding of lung field: Secondary | ICD-10-CM | POA: Diagnosis not present

## 2014-03-16 DIAGNOSIS — Z882 Allergy status to sulfonamides status: Secondary | ICD-10-CM | POA: Diagnosis not present

## 2014-03-16 HISTORY — DX: Tobacco use: Z72.0

## 2014-03-16 LAB — I-STAT TROPONIN, ED: Troponin i, poc: 0.03 ng/mL (ref 0.00–0.08)

## 2014-03-16 LAB — BASIC METABOLIC PANEL
Anion gap: 14 (ref 5–15)
BUN: 11 mg/dL (ref 6–23)
CALCIUM: 9.7 mg/dL (ref 8.4–10.5)
CO2: 23 mEq/L (ref 19–32)
Chloride: 94 mEq/L — ABNORMAL LOW (ref 96–112)
Creatinine, Ser: 0.83 mg/dL (ref 0.50–1.35)
GFR calc Af Amer: >90 mL/min (ref >90)
GFR, EST NON AFRICAN AMERICAN: 84 mL/min — AB (ref >90)
GLUCOSE: 121 mg/dL — AB (ref 70–99)
Potassium: 4.8 mEq/L (ref 3.7–5.3)
Sodium: 131 mEq/L — ABNORMAL LOW (ref 137–147)

## 2014-03-16 LAB — CBC
HEMATOCRIT: 36.8 % — AB (ref 39.0–52.0)
Hemoglobin: 12.5 g/dL — ABNORMAL LOW (ref 13.0–17.0)
MCH: 30.5 pg (ref 26.0–34.0)
MCHC: 34 g/dL (ref 30.0–36.0)
MCV: 89.8 fL (ref 78.0–100.0)
PLATELETS: 291 10*3/uL (ref 150–400)
RBC: 4.1 MIL/uL — ABNORMAL LOW (ref 4.22–5.81)
RDW: 13.4 % (ref 11.5–15.5)
WBC: 10.7 10*3/uL — ABNORMAL HIGH (ref 4.0–10.5)

## 2014-03-16 LAB — GLUCOSE, CAPILLARY
GLUCOSE-CAPILLARY: 104 mg/dL — AB (ref 70–99)
Glucose-Capillary: 107 mg/dL — ABNORMAL HIGH (ref 70–99)

## 2014-03-16 LAB — PROCALCITONIN

## 2014-03-16 MED ORDER — FENOFIBRATE 160 MG PO TABS
160.0000 mg | ORAL_TABLET | Freq: Every day | ORAL | Status: DC
  Administered 2014-03-16 – 2014-03-19 (×4): 160 mg via ORAL
  Filled 2014-03-16 (×4): qty 1

## 2014-03-16 MED ORDER — TAMSULOSIN HCL 0.4 MG PO CAPS
0.4000 mg | ORAL_CAPSULE | Freq: Every day | ORAL | Status: DC
  Administered 2014-03-16 – 2014-03-18 (×3): 0.4 mg via ORAL
  Filled 2014-03-16 (×4): qty 1

## 2014-03-16 MED ORDER — IPRATROPIUM-ALBUTEROL 0.5-2.5 (3) MG/3ML IN SOLN
3.0000 mL | Freq: Four times a day (QID) | RESPIRATORY_TRACT | Status: DC
  Administered 2014-03-16 – 2014-03-19 (×12): 3 mL via RESPIRATORY_TRACT
  Filled 2014-03-16 (×7): qty 3
  Filled 2014-03-16: qty 30
  Filled 2014-03-16 (×4): qty 3

## 2014-03-16 MED ORDER — ENOXAPARIN SODIUM 40 MG/0.4ML ~~LOC~~ SOLN
40.0000 mg | SUBCUTANEOUS | Status: DC
  Administered 2014-03-16 – 2014-03-18 (×3): 40 mg via SUBCUTANEOUS
  Filled 2014-03-16 (×4): qty 0.4

## 2014-03-16 MED ORDER — CARVEDILOL 12.5 MG PO TABS: 12.5000 mg | ORAL_TABLET | Freq: Two times a day (BID) | ORAL | Status: DC

## 2014-03-16 MED ORDER — GABAPENTIN 300 MG PO CAPS
300.0000 mg | ORAL_CAPSULE | Freq: Two times a day (BID) | ORAL | Status: DC
  Administered 2014-03-16 – 2014-03-19 (×6): 300 mg via ORAL
  Filled 2014-03-16 (×7): qty 1

## 2014-03-16 MED ORDER — SODIUM CHLORIDE 0.9 % IV SOLN: 250.0000 mL | INTRAVENOUS | Status: DC | PRN

## 2014-03-16 MED ORDER — HYDROCOD POLST-CHLORPHEN POLST 10-8 MG/5ML PO LQCR
5.0000 mL | Freq: Two times a day (BID) | ORAL | Status: DC
  Administered 2014-03-16: 5 mL via ORAL
  Filled 2014-03-16 (×2): qty 5

## 2014-03-16 MED ORDER — METHYLPREDNISOLONE SODIUM SUCC 40 MG IJ SOLR
40.0000 mg | Freq: Two times a day (BID) | INTRAMUSCULAR | Status: DC
  Filled 2014-03-16: qty 1

## 2014-03-16 MED ORDER — CARVEDILOL 12.5 MG PO TABS
12.5000 mg | ORAL_TABLET | Freq: Two times a day (BID) | ORAL | Status: DC
  Administered 2014-03-16 – 2014-03-19 (×7): 12.5 mg via ORAL
  Filled 2014-03-16 (×10): qty 1

## 2014-03-16 MED ORDER — FUROSEMIDE 20 MG PO TABS
20.0000 mg | ORAL_TABLET | Freq: Every day | ORAL | Status: DC
  Administered 2014-03-16 – 2014-03-19 (×4): 20 mg via ORAL
  Filled 2014-03-16 (×4): qty 1

## 2014-03-16 MED ORDER — LEVOFLOXACIN IN D5W 500 MG/100ML IV SOLN
500.0000 mg | Freq: Once | INTRAVENOUS | Status: AC
  Administered 2014-03-16: 500 mg via INTRAVENOUS
  Filled 2014-03-16: qty 100

## 2014-03-16 MED ORDER — ALBUTEROL SULFATE (2.5 MG/3ML) 0.083% IN NEBU: 2.5000 mg | INHALATION_SOLUTION | RESPIRATORY_TRACT | Status: DC | PRN

## 2014-03-16 MED ORDER — BUDESONIDE 0.25 MG/2ML IN SUSP: 0.2500 mg | Freq: Four times a day (QID) | RESPIRATORY_TRACT | Status: DC

## 2014-03-16 MED ORDER — BUDESONIDE 0.25 MG/2ML IN SUSP
0.2500 mg | Freq: Four times a day (QID) | RESPIRATORY_TRACT | Status: DC
  Filled 2014-03-16 (×3): qty 2

## 2014-03-16 MED ORDER — LEVOFLOXACIN 750 MG PO TABS: 750.0000 mg | ORAL_TABLET | Freq: Every day | ORAL | Status: DC

## 2014-03-16 MED ORDER — SPIRONOLACTONE 12.5 MG HALF TABLET
12.5000 mg | ORAL_TABLET | Freq: Every day | ORAL | Status: DC
  Administered 2014-03-16 – 2014-03-19 (×4): 12.5 mg via ORAL
  Filled 2014-03-16 (×4): qty 1

## 2014-03-16 MED ORDER — ASPIRIN EC 81 MG PO TBEC
81.0000 mg | DELAYED_RELEASE_TABLET | Freq: Every day | ORAL | Status: DC
Start: 2014-03-17 — End: 2014-03-19
  Administered 2014-03-17 – 2014-03-19 (×3): 81 mg via ORAL
  Filled 2014-03-16 (×3): qty 1

## 2014-03-16 MED ORDER — PANTOPRAZOLE SODIUM 40 MG PO TBEC
40.0000 mg | DELAYED_RELEASE_TABLET | Freq: Every day | ORAL | Status: DC
  Administered 2014-03-16: 40 mg via ORAL
  Filled 2014-03-16: qty 1

## 2014-03-16 MED ORDER — FUROSEMIDE 20 MG PO TABS: 20.0000 mg | ORAL_TABLET | Freq: Every day | ORAL | Status: DC

## 2014-03-16 MED ORDER — PAROXETINE HCL 20 MG PO TABS
40.0000 mg | ORAL_TABLET | Freq: Every day | ORAL | Status: DC
  Administered 2014-03-16 – 2014-03-19 (×4): 40 mg via ORAL
  Filled 2014-03-16 (×4): qty 2

## 2014-03-16 MED ORDER — ACETAMINOPHEN 325 MG PO TABS: 650.0000 mg | ORAL_TABLET | ORAL | Status: DC | PRN

## 2014-03-16 MED ORDER — GABAPENTIN 300 MG PO CAPS
300.0000 mg | ORAL_CAPSULE | Freq: Two times a day (BID) | ORAL | Status: DC
  Filled 2014-03-16: qty 1

## 2014-03-16 MED ORDER — BUSPIRONE HCL 15 MG PO TABS
15.0000 mg | ORAL_TABLET | Freq: Two times a day (BID) | ORAL | Status: DC
  Filled 2014-03-16: qty 1

## 2014-03-16 MED ORDER — VITAMIN B-12 1000 MCG PO TABS
1000.0000 ug | ORAL_TABLET | Freq: Every day | ORAL | Status: DC
  Administered 2014-03-16 – 2014-03-19 (×4): 1000 ug via ORAL
  Filled 2014-03-16 (×4): qty 1

## 2014-03-16 MED ORDER — BUDESONIDE 0.25 MG/2ML IN SUSP
0.2500 mg | Freq: Four times a day (QID) | RESPIRATORY_TRACT | Status: DC
  Administered 2014-03-16 – 2014-03-19 (×10): 0.25 mg via RESPIRATORY_TRACT
  Filled 2014-03-16 (×15): qty 2

## 2014-03-16 MED ORDER — BENAZEPRIL HCL 10 MG PO TABS
10.0000 mg | ORAL_TABLET | Freq: Every day | ORAL | Status: DC
  Administered 2014-03-16 – 2014-03-19 (×4): 10 mg via ORAL
  Filled 2014-03-16 (×5): qty 1

## 2014-03-16 MED ORDER — AMIODARONE HCL 200 MG PO TABS: 200.0000 mg | ORAL_TABLET | Freq: Every day | ORAL | Status: DC

## 2014-03-16 MED ORDER — AMIODARONE HCL 200 MG PO TABS
200.0000 mg | ORAL_TABLET | Freq: Two times a day (BID) | ORAL | Status: DC
  Administered 2014-03-16 – 2014-03-19 (×6): 200 mg via ORAL
  Filled 2014-03-16 (×7): qty 1

## 2014-03-16 MED ORDER — PANTOPRAZOLE SODIUM 40 MG PO TBEC: 40.0000 mg | DELAYED_RELEASE_TABLET | Freq: Every day | ORAL | Status: DC

## 2014-03-16 MED ORDER — METHYLPREDNISOLONE SODIUM SUCC 40 MG IJ SOLR
40.0000 mg | Freq: Two times a day (BID) | INTRAMUSCULAR | Status: DC
  Administered 2014-03-16 – 2014-03-17 (×2): 40 mg via INTRAVENOUS
  Filled 2014-03-16 (×4): qty 1

## 2014-03-16 MED ORDER — LEVOFLOXACIN 750 MG PO TABS
750.0000 mg | ORAL_TABLET | Freq: Every day | ORAL | Status: DC
  Administered 2014-03-17 – 2014-03-19 (×3): 750 mg via ORAL
  Filled 2014-03-16 (×3): qty 1

## 2014-03-16 MED ORDER — NITROGLYCERIN 0.4 MG SL SUBL: 0.4000 mg | SUBLINGUAL_TABLET | SUBLINGUAL | Status: DC | PRN

## 2014-03-16 MED ORDER — BUSPIRONE HCL 15 MG PO TABS
15.0000 mg | ORAL_TABLET | Freq: Two times a day (BID) | ORAL | Status: DC
  Administered 2014-03-16 – 2014-03-19 (×6): 15 mg via ORAL
  Filled 2014-03-16 (×7): qty 1

## 2014-03-16 MED ORDER — HYDROCODONE-ACETAMINOPHEN 10-325 MG PO TABS: 1.0000 | ORAL_TABLET | Freq: Four times a day (QID) | ORAL | Status: DC | PRN

## 2014-03-16 MED ORDER — ATORVASTATIN CALCIUM 80 MG PO TABS
80.0000 mg | ORAL_TABLET | Freq: Every day | ORAL | Status: DC
  Administered 2014-03-16 – 2014-03-18 (×3): 80 mg via ORAL
  Filled 2014-03-16 (×4): qty 1

## 2014-03-16 MED ORDER — ONDANSETRON HCL 4 MG/2ML IJ SOLN: 4.0000 mg | Freq: Four times a day (QID) | INTRAMUSCULAR | Status: DC | PRN

## 2014-03-16 NOTE — ED Notes (Signed)
MD at bedside. 

## 2014-03-16 NOTE — ED Notes (Signed)
Jason from [employer] called and states he will be here in 30 minutes to interrogate pacemaker.

## 2014-03-16 NOTE — H&P (Signed)
Name: Steven Guzman MRN: [id number] DOB: 1940-09-30    ADMISSION DATE:  03/16/2014  REFERRING MD :  Dr. Alvino Chapel   CHIEF COMPLAINT:  Hemoptysis, Cough  BRIEF PATIENT DESCRIPTION: 73 y/o M, current 1ppd smoker, admitted on 12/18 after AICD fired x2 and progressive cough, sputum production & SOB.    SIGNIFICANT EVENTS  12/18  Admit with hemoptysis, progressive cough, sputum production, SOB and AICD fire x2  STUDIES:  12/18  ICD Interrogation >> polymorphic VT   HISTORY OF PRESENT ILLNESS:  73 y/o M, current 1ppd smoker, with a PMH of GERD, CAD s/p MI, ICM s/p AICD, HTN, HLD and  GOLD II COPD who presented to [facility] ER on 12/18 with a 2 week hx of progressive SOB, DOE, cough, increase in quantity and change in color of sputum to dark brown from yellow.  He was recently seen in the Pulmonary office and treated with azithro.  He followed with on 11/24 for ROV and was felt to have recovered from his recent flare.  Despite frequent exacerbations, he continues to smoke on average 1ppd.    The patient currently endorses cough, bright red small volume hemoptysis, shortness of breath at rest and low grade fevers.  At baseline (on a good day), he reports he can walk approx 20 yards before resting.  He has noted over the past few weeks that he is not able to do his normal home activities (vaccuming, shopping etc).  He was seen on 12/14 by his PCP for pain in his low back pain and headache for what he thought was shingles and now recognizes as his variant of possible fever.    Early am 12/18 he woke with cough and small volume of bright red sputum production.  He woke early and showered thinking he would go to the pulmonary office to be seen.  However, in the shower his AICD fired x2 prompting him to call EMS.  Evaluation of AICD was notable for VT. Of note, he ran out of Coreg approx 2-3 days prior to admission due to a pharmacy mix up.  He was noted to have significant cough in ER with desaturations &  PCCM called for admission.   PAST MEDICAL HISTORY :   has a past medical history of Hypertension; Hyperlipidemia; COPD (chronic obstructive pulmonary disease); GERD (gastroesophageal reflux disease); Diverticulitis; CAD (coronary artery disease); Depression; Aneurysm; and Tobacco abuse.  has past surgical history that includes Hernia repair; Foot surgery; Hemorrhoid banding; Retinal detachment surgery; Angioplasty; Cardiac catheterization; Nose surgery; Colonoscopy; Tenolysis (Right, 12/21/2013); and left heart catheterization with coronary angiogram (N/A, 11/25/2012).   HOME MEDICATIONS:  Prior to Admission medications   Medication Sig Start Date End Date Taking? Authorizing Provider  amiodarone (PACERONE) 200 MG tablet Take 1 tablet (200 mg total) by mouth daily. 11/07/13  Yes [place], MD  aspirin 81 MG tablet Take 81 mg by mouth daily.   Yes Historical Provider, MD  atorvastatin (LIPITOR) 80 MG tablet Take 80 mg by mouth daily.   Yes Historical Provider, MD  benazepril (LOTENSIN) 10 MG tablet TAKE 1 TABLET BY MOUTH EVERY DAY 01/12/14  Yes Belva Crome III, MD  budesonide-formoterol [facility]) 160-4.5 MCG/ACT inhaler Take 2 puffs first thing in am and then another 2 puffs about 12 hours later. 02/06/14  Yes Tanda Rockers, MD  carvedilol (COREG) 12.5 MG tablet Take 12.5 mg by mouth 2 (two) times daily with a meal.   Yes Historical Provider, MD  Choline  Fenofibrate (TRILIPIX) 135 MG capsule Take 1 capsule (135 mg total) by mouth daily. 12/25/13  Yes Belva Crome III, MD  furosemide (LASIX) 40 MG tablet Take 0.5 tablets by mouth daily.   Yes Historical Provider, MD  GABAPENTIN PO Take 300 mg by mouth 2 (two) times daily. 3 CAPSULES TWICE DAILY   Yes Historical Provider, MD  HYDROcodone-acetaminophen (NORCO) 10-325 MG per tablet Take 1 tablet by mouth every 6 (six) hours as needed. 12/21/13  Yes Daryll Brod, MD  nitroGLYCERIN (NITROSTAT) 0.4 MG SL tablet Place 1 tablet (0.4 mg total) under  the tongue every 5 (five) minutes as needed for chest pain. 11/25/12  Yes Belva Crome III, MD  omeprazole (PRILOSEC) 20 MG capsule Take 20 mg by mouth 2 (two) times daily.   Yes Historical Provider, MD  PARoxetine (PAXIL) 40 MG tablet Take 40 mg by mouth every morning.   Yes Historical Provider, MD  spironolactone (ALDACTONE) 25 MG tablet Take 0.5 tablets (12.5 mg total) by mouth daily. 02/28/14  Yes Belva Crome III, MD  tamsulosin (FLOMAX) 0.4 MG CAPS capsule Take by mouth daily after supper.    Yes Historical Provider, MD  tiotropium (SPIRIVA) 18 MCG inhalation capsule Place 1 capsule (18 mcg total) into inhaler and inhale daily. 11/14/13  Yes Juanito Doom, MD  vitamin B-12 (CYANOCOBALAMIN) 1000 MCG tablet Take 1,000 mcg by mouth daily.   Yes Historical Provider, MD  busPIRone (BUSPAR) 15 MG tablet Take 15 mg by mouth 2 (two) times daily.    Historical Provider, MD   Allergies  Allergen Reactions  . Sulfa Antibiotics     hives    FAMILY HISTORY:  family history includes CAD in his father and mother; Heart attack in his brother. SOCIAL HISTORY:  reports that he has been smoking Cigarettes.  He has a 27.5 pack-year smoking history. He has never used smokeless tobacco. He reports that he drinks alcohol. He reports that he does not use illicit drugs.  REVIEW OF SYSTEMS:   Constitutional: Negative for chills, weight loss, and diaphoresis. +sub fevers, malaise / fatigue HENT: Negative for hearing loss, ear pain, nosebleeds, congestion, sore throat, neck pain, tinnitus and ear discharge.   Eyes: Negative for blurred vision, double vision, photophobia, pain, discharge and redness.  Respiratory: Negative for stridor.  Positive for cough, hemoptysis, sputum production, shortness of breath, wheezing  Cardiovascular: Negative for chest pain, palpitations, orthopnea, claudication, leg swelling and PND.  Gastrointestinal: Negative for heartburn, nausea, vomiting, abdominal pain, diarrhea,  constipation, blood in stool and melena.  Genitourinary: Negative for dysuria, urgency, frequency, hematuria and flank pain.  Musculoskeletal: Negative for myalgias, joint pain and falls. + low back pain Skin: Negative for itching and rash.  Neurological: Negative for dizziness, tingling, tremors, sensory change, speech change, focal weakness, seizures, loss of consciousness, weakness and headaches.  Endo/Heme/Allergies: Negative for environmental allergies and polydipsia. Does not bruise/bleed easily.  SUBJECTIVE:   VITAL SIGNS: Temp:  [99.6 F (37.6 C)] 99.6 F (37.6 C) (12/18 0852) Pulse Rate:  [38-88] 64 (12/18 1345) Resp:  [11-29] 22 (12/18 1345) BP: (109-140)/(69-93) 116/78 mmHg (12/18 1345) SpO2:  [89 %-95 %] 95 % (12/18 1345) Weight:  [225 lb (102.059 kg)] 225 lb (102.059 kg) (12/18 1448)  PHYSICAL EXAMINATION: General:  Chronically ill adult male in NAD Neuro:  AAOx4, speech clear, MAE HEENT:  Mm pink/moist, good dentition Cardiovascular:  s1s2 distant  Lungs: accessory muscle use at rest but no distress, significant bilateral wheezing  Abdomen:  Obese, soft, bsx4 active  Musculoskeletal:  No acute deformities  Skin:  Moist, warm, no edema    Recent Labs Lab 03/16/14 0854  NA 131*  K 4.8  CL 94*  CO2 23  BUN 11  CREATININE 0.83  GLUCOSE 121*    Recent Labs Lab 03/16/14 0854  HGB 12.5*  HCT 36.8*  WBC 10.7*  PLT 291   Dg Chest Portable 1 View  03/16/2014   CLINICAL DATA:  Cough and shortness of Breath  EXAM: PORTABLE CHEST - 1 VIEW  COMPARISON:  10/19/2013  FINDINGS: Cardiac size is stable. Two leads cardiac pacemaker is unchanged in position. There is infiltrate/pneumonia in left suprahilar region. Follow-up to resolution after appropriate treatment is recommended.  IMPRESSION: Infiltrate/ pneumonia in left suprahilar region. Follow-up to resolution after appropriate treatment is recommended. Stable dual lead cardiac pacemaker position.   Electronically  Signed   By: Lahoma Crocker M.D.   On: 03/16/2014 09:38    ASSESSMENT / PLAN:  R/O PNA - L suprahilar questionable infiltrate, ddx includes blood with vigorous coughing, HCAP, & atx.  Recent course of azithro as an outpatient.   AECOPD  Continued Tobacco Abuse - ongoing 1ppd use Hypoxemia  Hemoptysis - scant on admission in setting of cough  Plan: Sputum culture Monotherapy levaquin D1/x PCT protocol  Follow up 2 view CXR in am to review infiltrate Assess U. Strep antigen Consider CT chest / FOB if ongoing hemoptysis or continued worrisome findings on 2 view Scheduled nebulized bronchodilators Solu-medrol 40 mg IV Q12 with consideration for taper to oral in am 12/19 Will need ambulatory assessment of O2 needs prior to discharge  Cough suppression with tussionex   VT - s/p ICD firing x2  HTN - off coreg for approx 2-3 days prior to admit HLD ICM  - EF 30% in 2014 by cath   Plan: Coreg 12.5 BID Increase amio to 200 mg BID x 2 weeks No driving x 6 months post discharge  Follow up with Dr. Lovena Le as an outpatient  ASA  Continue home medications  GERD  Plan:  PPI    DVT:  Lovenox, monitor closely with hx hempotysis SUP:  Baseline omeprazole   Noe Gens, NP-C [facility] Pgr: [phone] or [phone]  PCCM ATTENDING: Pt seen on work rounds with care provider noted above. We reviewed pt's initial presentation, consultants notes and hospital database in detail.  The above assessment and plan was formulated under my direction.   Merton Border, MD;  PCCM service; [place] [phone]   03/16/2014, 2:18 PM

## 2014-03-16 NOTE — ED Provider Notes (Signed)
CSN: [id number]     Arrival date & time 03/16/14  5852 History   First MD Initiated Contact with Patient 03/16/14 [phone]     Chief Complaint  Patient presents with  . AICD Problem     (Consider location/radiation/quality/duration/timing/severity/associated sxs/prior Treatment) The history is provided by the patient.   patient with 2 discharges of his AICD. States he was in the shower [place] fill little lightheaded and then felt fire, and then shortly afterward fired again. No loss consciousness. No chest pain. States he has had a worsening cough for the last few days. States he has had increased sputum. States he coughed up a little blood this morning before the AICD fired. No fevers. No current lightheadedness dizziness or chest pain. He states he has been out of his Coreg for the last 2 days.He states he's been otherwise compliant with his medication.  Past Medical History  Diagnosis Date  . Hypertension   . Hyperlipidemia   . COPD (chronic obstructive pulmonary disease)     a. followed by pulmonary, COPD GOLD stage II  . GERD (gastroesophageal reflux disease)   . Diverticulitis   . CAD (coronary artery disease)     a. s/p MI in 1994 with PCI to LAD at that time b. cath 10/2012 demonstrated EF 30%, inferior akinesis with mild hypokinesis of all walls, patent LAD and RCA stents; ostial PDA with 80-90% obstruction with medical therapy recommended   . Depression   . Aneurysm     abdominal <4  . Tobacco abuse    Past Surgical History  Procedure Laterality Date  . Hernia repair    . Foot surgery      lt  . Hemorrhoid banding    . Retinal detachment surgery      right  . Angioplasty    . Cardiac catheterization    . Nose surgery      skin cancer-mohs-skin graft  . Colonoscopy    . Tenolysis Right 12/21/2013    Procedure: TENOLYSIS FLEXOR CARPI RADIALIS ,DEBRIDEMENT RIGHT JOINT WRIST,DEBRIDEMENT SCAPHOTRAPEZIAL TRAPEZOID, REPAIR OF EXTENSOR HOOD;  Surgeon: Daryll Brod, MD;  Location:  [facility];  Service: Orthopedics;  Laterality: Right;  . Left heart catheterization with coronary angiogram N/A 11/25/2012    demonstrated EF 30%, inferior akinesis with mild hypokinesis of all walls, patent LAD and RCA stents; ostial PDA with 80-90% obstruction with medical therapy recommended   Family History  Problem Relation Age of Onset  . Heart attack Brother   . CAD Father   . CAD Mother    History  Substance Use Topics  . Smoking status: Current Every Day Smoker -- 0.50 packs/day for 55 years    Types: Cigarettes  . Smokeless tobacco: Never Used  . Alcohol Use: 0.0 oz/week    0 Not specified per week     Comment: occasionally     Review of Systems  Constitutional: Negative for activity change and appetite change.  Eyes: Negative for pain.  Respiratory: Positive for cough and shortness of breath. Negative for chest tightness.        Hemoptysis  Cardiovascular: Negative for chest pain and leg swelling.  Gastrointestinal: Negative for nausea, vomiting, abdominal pain and diarrhea.  Genitourinary: Negative for flank pain.  Musculoskeletal: Negative for back pain and neck stiffness.  Skin: Negative for rash.  Neurological: Positive for light-headedness. Negative for weakness, numbness and headaches.  Psychiatric/Behavioral: Negative for behavioral problems.      Allergies  Sulfa antibiotics  Home  Medications   Prior to Admission medications   Medication Sig Start Date End Date Taking? Authorizing Provider  amiodarone (PACERONE) 200 MG tablet Take 1 tablet (200 mg total) by mouth daily. 11/07/13  Yes [place], MD  aspirin 81 MG tablet Take 81 mg by mouth daily.   Yes Historical Provider, MD  atorvastatin (LIPITOR) 80 MG tablet Take 80 mg by mouth daily.   Yes Historical Provider, MD  benazepril (LOTENSIN) 10 MG tablet TAKE 1 TABLET BY MOUTH EVERY DAY 01/12/14  Yes Belva Crome III, MD  budesonide-formoterol [facility]) 160-4.5 MCG/ACT inhaler  Take 2 puffs first thing in am and then another 2 puffs about 12 hours later. 02/06/14  Yes Tanda Rockers, MD  carvedilol (COREG) 12.5 MG tablet Take 12.5 mg by mouth 2 (two) times daily with a meal.   Yes Historical Provider, MD  Choline Fenofibrate (TRILIPIX) 135 MG capsule Take 1 capsule (135 mg total) by mouth daily. 12/25/13  Yes Belva Crome III, MD  furosemide (LASIX) 40 MG tablet Take 0.5 tablets by mouth daily.   Yes Historical Provider, MD  GABAPENTIN PO Take 300 mg by mouth 2 (two) times daily. 3 CAPSULES TWICE DAILY   Yes Historical Provider, MD  HYDROcodone-acetaminophen (NORCO) 10-325 MG per tablet Take 1 tablet by mouth every 6 (six) hours as needed. 12/21/13  Yes Daryll Brod, MD  nitroGLYCERIN (NITROSTAT) 0.4 MG SL tablet Place 1 tablet (0.4 mg total) under the tongue every 5 (five) minutes as needed for chest pain. 11/25/12  Yes Belva Crome III, MD  omeprazole (PRILOSEC) 20 MG capsule Take 20 mg by mouth 2 (two) times daily.   Yes Historical Provider, MD  PARoxetine (PAXIL) 40 MG tablet Take 40 mg by mouth every morning.   Yes Historical Provider, MD  spironolactone (ALDACTONE) 25 MG tablet Take 0.5 tablets (12.5 mg total) by mouth daily. 02/28/14  Yes Belva Crome III, MD  tamsulosin (FLOMAX) 0.4 MG CAPS capsule Take by mouth daily after supper.    Yes Historical Provider, MD  tiotropium (SPIRIVA) 18 MCG inhalation capsule Place 1 capsule (18 mcg total) into inhaler and inhale daily. 11/14/13  Yes Juanito Doom, MD  vitamin B-12 (CYANOCOBALAMIN) 1000 MCG tablet Take 1,000 mcg by mouth daily.   Yes Historical Provider, MD  busPIRone (BUSPAR) 15 MG tablet Take 15 mg by mouth 2 (two) times daily.    Historical Provider, MD   BP 116/78 mmHg  Pulse 64  Temp(Src) 99.6 F (37.6 C) (Oral)  Resp 22  Ht 5\' 11"  (1.803 m)  Wt 225 lb (102.059 kg)  BMI 31.39 kg/m2  SpO2 95% Physical Exam  Constitutional: He is oriented to person, place, and time. He appears well-developed and  well-nourished.  HENT:  Head: Normocephalic and atraumatic.  Eyes: EOM are normal. Pupils are equal, round, and reactive to light.  Neck: Normal range of motion. Neck supple.  Cardiovascular: Normal rate, regular rhythm and normal heart sounds.   No murmur heard. Pulmonary/Chest: Effort normal.  Mildly harsh breath sounds  Abdominal: Soft. Bowel sounds are normal. He exhibits no distension and no mass. There is no tenderness. There is no rebound and no guarding.  Musculoskeletal: Normal range of motion. He exhibits no edema.  Neurological: He is alert and oriented to person, place, and time. No cranial nerve deficit.  Skin: Skin is warm and dry.  Psychiatric: He has a normal mood and affect.  Nursing note and vitals reviewed.  ED Course  Procedures (including critical care time) Labs Review Labs Reviewed  CBC - Abnormal; Notable for the following:    WBC 10.7 (*)    RBC 4.10 (*)    Hemoglobin 12.5 (*)    HCT 36.8 (*)    All other components within normal limits  BASIC METABOLIC PANEL - Abnormal; Notable for the following:    Sodium 131 (*)    Chloride 94 (*)    Glucose, Bld 121 (*)    GFR calc non Af Amer 84 (*)    All other components within normal limits  I-STAT TROPOININ, ED    Imaging Review Dg Chest Portable 1 View  03/16/2014   CLINICAL DATA:  Cough and shortness of Breath  EXAM: PORTABLE CHEST - 1 VIEW  COMPARISON:  10/19/2013  FINDINGS: Cardiac size is stable. Two leads cardiac pacemaker is unchanged in position. There is infiltrate/pneumonia in left suprahilar region. Follow-up to resolution after appropriate treatment is recommended.  IMPRESSION: Infiltrate/ pneumonia in left suprahilar region. Follow-up to resolution after appropriate treatment is recommended. Stable dual lead cardiac pacemaker position.   Electronically Signed   By: Lahoma Crocker M.D.   On: 03/16/2014 09:38     EKG Interpretation   Date/Time:  Friday March 16 2014 08:51:45 EST Ventricular  Rate:  85 PR Interval:  148 QRS Duration: 114 QT Interval:  442 QTC Calculation: 526 R Axis:   88 Text Interpretation:  Sinus rhythm Inferior infarct, age indeterminate  Prolonged QT interval No significant change since last tracing Confirmed  by Alvino Chapel  MD, Ovid Curd [phone]) on 03/16/2014 8:57:43 AM      MDM   Final diagnoses:  Cough  Ventricular fibrillation  CAP (community acquired pneumonia)    Patient presents with shortness of breath. Has had some hemoptysis. Has a rather severe COPD. Found to have community acquired pneumonia on x-ray. Has been on azithromycin. Patient was doing well and not hypoxic, however while in the ER he developed some mild hypoxia. Will admit to pulmonary. Patient also had his AICD fire today. It was for ventricular fibrillation. He had been noncompliant with his Coreg. Seen by EP and cleared.    Jasper Riling. Alvino Chapel, MD 03/16/14 [phone]

## 2014-03-16 NOTE — ED Notes (Signed)
Pt remains monitored by blood pressure, pulse ox, and 12 lead. pts family remains at bedside.  

## 2014-03-16 NOTE — ED Notes (Signed)
Pts O2 sats 89% on RA. Pt placed on 2 L via nasal cannula and O2 now > 90%

## 2014-03-16 NOTE — Consult Note (Signed)
ELECTROPHYSIOLOGY CONSULT NOTE    Patient ID: Steven Guzman MRN: [id number], DOB/AGE: January 29, 1941 73 y.o.  Admit date: 03/16/2014 Date of Consult: 03/16/2014  Primary Physician: Dorian Heckle, MD Primary Cardiologist: Tamala Julian Electrophysiologist: Lovena Le  Reason for Consultation: ICD shock  HPI:  Steven Guzman is a 73 y.o. male with a past medical history significant for CAD (MI in 1540 complicated by cardiac arrest with PCI at that time), VT s/p ICD implant (BSX dual chamber ICD), hypertension, hyperlipidemia, COPD, and depression.  He is followed closely by pulmonary for his lung disease.  For the last week, he has had progressive shortness of breath and hemoptysis.   This morning, he had planned on calling his pulmonologist for further evaluation of shortness of breath.  While in the shower, he received 2 ICD shock preceded by dizziness.  He did not have syncope.  He was brought to [facility] for further evaluation.  Device interrogation demonstrates normal device function.  1 episode in VF zone of polymorphic VT with a cycle length of 225msec, failed to terminate with ATP, broke after 31J shock but resumed at a cycle length of 360msec and terminated with second 31J shock.    Last cath 11-25-12 demonstrated EF 30%, inferior akinesis with mild hypokinesis of all walls, patent LAD and RCA stents; ostial PDA with 80-90% obstruction with medical therapy recommended. There is no echo in the chart.   He denies recent chest pain (had typical angina at time of MI in 1994), PND or orthopnea.  He does not weight himself daily and is unsure about recent weight gain.  He has chronic dyspnea on exertion.  For the last week, he has had occasional fevers.    ROS is otherwise negative.   Of note, he has not taken Coreg in 2 days. He reports compliance with Amiodarone.  Diuretics were recently decreased due to hypotension.   EP has been asked to evaluate for treatment options.    Past Medical History   Diagnosis Date  . Hypertension   . Hyperlipidemia   . COPD (chronic obstructive pulmonary disease)     a. followed by pulmonary, COPD GOLD stage II  . GERD (gastroesophageal reflux disease)   . Diverticulitis   . CAD (coronary artery disease)     a. s/p MI in 1994 with PCI to LAD at that time b. cath 10/2012 demonstrated EF 30%, inferior akinesis with mild hypokinesis of all walls, patent LAD and RCA stents; ostial PDA with 80-90% obstruction with medical therapy recommended   . Depression   . Aneurysm     abdominal <4  . Tobacco abuse      Surgical History:  Past Surgical History  Procedure Laterality Date  . Hernia repair    . Foot surgery      lt  . Hemorrhoid banding    . Retinal detachment surgery      right  . Angioplasty    . Cardiac catheterization    . Nose surgery      skin cancer-mohs-skin graft  . Colonoscopy    . Tenolysis Right 12/21/2013    Procedure: TENOLYSIS FLEXOR CARPI RADIALIS ,DEBRIDEMENT RIGHT JOINT WRIST,DEBRIDEMENT SCAPHOTRAPEZIAL TRAPEZOID, REPAIR OF EXTENSOR HOOD;  Surgeon: Daryll Brod, MD;  Location: [facility];  Service: Orthopedics;  Laterality: Right;  . Left heart catheterization with coronary angiogram N/A 11/25/2012    demonstrated EF 30%, inferior akinesis with mild hypokinesis of all walls, patent LAD and RCA stents; ostial PDA with 80-90% obstruction  with medical therapy recommended     Prior to Admission medications   Medication Sig Start Date End Date Taking? Authorizing Provider  amiodarone (PACERONE) 200 MG tablet Take 1 tablet (200 mg total) by mouth daily. 11/07/13  Yes [place], MD  aspirin 81 MG tablet Take 81 mg by mouth daily.   Yes Historical Provider, MD  atorvastatin (LIPITOR) 80 MG tablet Take 80 mg by mouth daily.   Yes Historical Provider, MD  benazepril (LOTENSIN) 10 MG tablet TAKE 1 TABLET BY MOUTH EVERY DAY 01/12/14  Yes Belva Crome III, MD  budesonide-formoterol [facility]) 160-4.5 MCG/ACT inhaler  Take 2 puffs first thing in am and then another 2 puffs about 12 hours later. 02/06/14  Yes Tanda Rockers, MD  carvedilol (COREG) 12.5 MG tablet Take 12.5 mg by mouth 2 (two) times daily with a meal.   Yes Historical Provider, MD  Choline Fenofibrate (TRILIPIX) 135 MG capsule Take 1 capsule (135 mg total) by mouth daily. 12/25/13  Yes Belva Crome III, MD  furosemide (LASIX) 40 MG tablet Take 0.5 tablets by mouth daily.   Yes Historical Provider, MD  GABAPENTIN PO Take 300 mg by mouth 2 (two) times daily. 3 CAPSULES TWICE DAILY   Yes Historical Provider, MD  HYDROcodone-acetaminophen (NORCO) 10-325 MG per tablet Take 1 tablet by mouth every 6 (six) hours as needed. 12/21/13  Yes Daryll Brod, MD  nitroGLYCERIN (NITROSTAT) 0.4 MG SL tablet Place 1 tablet (0.4 mg total) under the tongue every 5 (five) minutes as needed for chest pain. 11/25/12  Yes Belva Crome III, MD  omeprazole (PRILOSEC) 20 MG capsule Take 20 mg by mouth 2 (two) times daily.   Yes Historical Provider, MD  PARoxetine (PAXIL) 40 MG tablet Take 40 mg by mouth every morning.   Yes Historical Provider, MD  spironolactone (ALDACTONE) 25 MG tablet Take 0.5 tablets (12.5 mg total) by mouth daily. 02/28/14  Yes Belva Crome III, MD  tamsulosin (FLOMAX) 0.4 MG CAPS capsule Take by mouth daily after supper.    Yes Historical Provider, MD  tiotropium (SPIRIVA) 18 MCG inhalation capsule Place 1 capsule (18 mcg total) into inhaler and inhale daily. 11/14/13  Yes Juanito Doom, MD  vitamin B-12 (CYANOCOBALAMIN) 1000 MCG tablet Take 1,000 mcg by mouth daily.   Yes Historical Provider, MD  busPIRone (BUSPAR) 15 MG tablet Take 15 mg by mouth 2 (two) times daily.    Historical Provider, MD    Allergies:  Allergies  Allergen Reactions  . Sulfa Antibiotics     hives    History   Social History  . Marital Status: Married    Spouse Name: N/A    Number of Children: N/A  . Years of Education: N/A   Occupational History  . Not on file.    Social History Main Topics  . Smoking status: Current Every Day Smoker -- 0.50 packs/day for 55 years    Types: Cigarettes  . Smokeless tobacco: Never Used  . Alcohol Use: 0.0 oz/week    0 Not specified per week     Comment: occasionally   . Drug Use: No  . Sexual Activity: Not Currently   Other Topics Concern  . Not on file   Social History Narrative     Family History  Problem Relation Age of Onset  . Heart attack Brother   . CAD Father   . CAD Mother     BP 123/82 mmHg  Pulse 67  Temp(Src) 99.6 F (37.6 C) (Oral)  Resp 22  Ht 5\' 11"  (1.803 m)  Wt 225 lb (102.059 kg)  BMI 31.39 kg/m2  SpO2 90%  Physical Exam: Filed Vitals:   03/16/14 1145 03/16/14 1200 03/16/14 1215 03/16/14 1230  BP: 109/86 122/75 135/69 119/93  Pulse: 38 65 65 74  Temp:      TempSrc:      Resp: 20 21 23 25   Height:      Weight:      SpO2: 90% 92% 91% 90%    GEN- The patient is elderly and chronically ill appearing, alert and oriented x 3 today.   Head- normocephalic, atraumatic Eyes-  Sclera clear, conjunctiva pink Ears- hearing intact Oropharynx- clear Neck- supple, Lungs- coarse BS at L side with diffuse expiratory wheezes, normal work of breathing Heart- Regular rate and rhythm  GI- soft, NT, ND, + BS Extremities- no clubbing, cyanosis, or edema, groin is without hematoma/ bruit MS- no significant deformity or atrophy Skin- no rash or lesion Psych- euthymic mood, full affect Neuro- strength and sensation are intact    Labs:   Lab Results  Component Value Date   WBC 10.7* 03/16/2014   HGB 12.5* 03/16/2014   HCT 36.8* 03/16/2014   MCV 89.8 03/16/2014   PLT 291 03/16/2014     Recent Labs Lab 03/16/14 0854  NA 131*  K 4.8  CL 94*  CO2 23  BUN 11  CREATININE 0.83  CALCIUM 9.7  GLUCOSE 121*     Radiology/Studies: Dg Chest Portable 1 View 03/16/2014   CLINICAL DATA:  Cough and shortness of Breath  EXAM: PORTABLE CHEST - 1 VIEW  COMPARISON:  10/19/2013   FINDINGS: Cardiac size is stable. Two leads cardiac pacemaker is unchanged in position. There is infiltrate/pneumonia in left suprahilar region. Follow-up to resolution after appropriate treatment is recommended.  IMPRESSION: Infiltrate/ pneumonia in left suprahilar region. Follow-up to resolution after appropriate treatment is recommended. Stable dual lead cardiac pacemaker position.   Electronically Signed   By: Lahoma Crocker M.D.   On: 03/16/2014 09:38    EKG: sinus, inferior infarct, Qtc 526  TELEMETRY: sinus rhythm  A/P 1.  VT The patient's ICD is polymorphic VT.  This occurs in the setting of not taking coreg for several days and also in the setting of progressive respiratory issues due to presumed pneumonia. He is clinically stable now from an EP standpoint. I have advised that he resume coreg 12.5mg  BID and also increase amiodarone to 200mg  BID x 2 weeks. I will arrange follow-up with Dr Lovena Le.  No driving x 6 months (pt is aware).  2. Respiratory failure The patient presents with progressive respiratory failure and hemoptysis.  He has a very prominent lesion on CXR which could be pneumonia but could also be another process. I would encourage ER team to discuss with Dr Lake Bells or covering physicians from Pulmonary service to see whether admission is indicated and to arrange workup.  Electrophysiology team to see as needed while here. Please call with questions.

## 2014-03-16 NOTE — ED Notes (Signed)
Per EMS: pt from home for eval of 2 defib shocks while taking a shower this a.m. Pt reported dizziness before the shock but denies any LOC or fall.  Pt denies any pain at this time. Pt only c/o hemoptysis this morning and not feeling well over the past few days. NAD, axox4.

## 2014-03-17 ENCOUNTER — Encounter: Payer: Self-pay | Admitting: Interventional Cardiology

## 2014-03-17 ENCOUNTER — Inpatient Hospital Stay (HOSPITAL_COMMUNITY): Payer: Medicare Other

## 2014-03-17 ENCOUNTER — Other Ambulatory Visit: Payer: Self-pay

## 2014-03-17 DIAGNOSIS — J189 Pneumonia, unspecified organism: Principal | ICD-10-CM

## 2014-03-17 DIAGNOSIS — I5022 Chronic systolic (congestive) heart failure: Secondary | ICD-10-CM

## 2014-03-17 DIAGNOSIS — I255 Ischemic cardiomyopathy: Secondary | ICD-10-CM

## 2014-03-17 LAB — GLUCOSE, CAPILLARY
GLUCOSE-CAPILLARY: 138 mg/dL — AB (ref 70–99)
Glucose-Capillary: 118 mg/dL — ABNORMAL HIGH (ref 70–99)
Glucose-Capillary: 133 mg/dL — ABNORMAL HIGH (ref 70–99)

## 2014-03-17 LAB — CBC
HEMATOCRIT: 38.4 % — AB (ref 39.0–52.0)
HEMOGLOBIN: 12.7 g/dL — AB (ref 13.0–17.0)
MCH: 30.2 pg (ref 26.0–34.0)
MCHC: 33.1 g/dL (ref 30.0–36.0)
MCV: 91.4 fL (ref 78.0–100.0)
Platelets: 346 10*3/uL (ref 150–400)
RBC: 4.2 MIL/uL — ABNORMAL LOW (ref 4.22–5.81)
RDW: 13.6 % (ref 11.5–15.5)
WBC: 9.9 10*3/uL (ref 4.0–10.5)

## 2014-03-17 LAB — TROPONIN I: Troponin I: <0.3 ng/mL (ref <0.30)

## 2014-03-17 LAB — STREP PNEUMONIAE URINARY ANTIGEN: Strep Pneumo Urinary Antigen: NEGATIVE

## 2014-03-17 MED ORDER — FAMOTIDINE 20 MG PO TABS
20.0000 mg | ORAL_TABLET | Freq: Two times a day (BID) | ORAL | Status: DC
  Administered 2014-03-17 – 2014-03-19 (×5): 20 mg via ORAL
  Filled 2014-03-17 (×6): qty 1

## 2014-03-17 MED ORDER — HYDROCOD POLST-CHLORPHEN POLST 10-8 MG/5ML PO LQCR
5.0000 mL | Freq: Two times a day (BID) | ORAL | Status: DC | PRN
  Administered 2014-03-17 – 2014-03-18 (×2): 5 mL via ORAL
  Filled 2014-03-17 (×2): qty 5

## 2014-03-17 NOTE — Progress Notes (Addendum)
Name: Steven Guzman MRN: [id number] DOB: 1940-11-13    ADMISSION DATE:  03/16/2014  REFERRING MD :  Dr. Alvino Chapel   CHIEF COMPLAINT:  Hemoptysis, Cough  BRIEF PATIENT DESCRIPTION: 73 y/o M, current 1ppd smoker, admitted on 12/18 after AICD fired x2 and progressive cough, sputum production & SOB.    SIGNIFICANT EVENTS  12/18  Admit with hemoptysis, progressive cough, sputum production, SOB and AICD fire x2  STUDIES:  12/18  ICD Interrogation >> polymorphic VT   SUBJECTIVE:  Feels better, less dyspnea and cough  VITAL SIGNS: Temp:  [97.6 F (36.4 C)-99.6 F (37.6 C)] 97.6 F (36.4 C) (12/19 0640) Pulse Rate:  [38-88] 66 (12/19 0640) Resp:  [11-29] 20 (12/19 0640) BP: (109-140)/(66-93) 121/82 mmHg (12/19 0640) SpO2:  [89 %-97 %] 91 % (12/19 0640) Weight:  [96.571 kg (212 lb 14.4 oz)-102.059 kg (225 lb)] 96.571 kg (212 lb 14.4 oz) (12/19 0640)  PHYSICAL EXAMINATION: General:  Chronically ill adult male in NAD Neuro:  AAOx4, speech clear, MAE HEENT:  Mm pink/moist, good dentition Cardiovascular:  s1s2 distant  Lungs: accessory muscle use at rest but no distress, significant bilateral wheezing  Abdomen:  Obese, soft, bsx4 active  Musculoskeletal:  No acute deformities  Skin:  Moist, warm, no edema    Recent Labs Lab 03/16/14 0854  NA 131*  K 4.8  CL 94*  CO2 23  BUN 11  CREATININE 0.83  GLUCOSE 121*    Recent Labs Lab 03/16/14 0854 03/17/14 0327  HGB 12.5* 12.7*  HCT 36.8* 38.4*  WBC 10.7* 9.9  PLT 291 346   Dg Chest Portable 1 View  03/16/2014   CLINICAL DATA:  Cough and shortness of Breath  EXAM: PORTABLE CHEST - 1 VIEW  COMPARISON:  10/19/2013  FINDINGS: Cardiac size is stable. Two leads cardiac pacemaker is unchanged in position. There is infiltrate/pneumonia in left suprahilar region. Follow-up to resolution after appropriate treatment is recommended.  IMPRESSION: Infiltrate/ pneumonia in left suprahilar region. Follow-up to resolution after  appropriate treatment is recommended. Stable dual lead cardiac pacemaker position.   Electronically Signed   By: Lahoma Crocker M.D.   On: 03/16/2014 09:38   12/19 CXR : Pending Note CT Chest screening 09/2013: no CA seen  ASSESSMENT / PLAN:  R/O PNA - L suprahilar questionable infiltrate, ddx includes blood with vigorous coughing, HCAP, & atx.  Recent course of azithro as an outpatient.   AECOPD  Continued Tobacco Abuse - ongoing 1ppd use Hypoxemia improved  Hemoptysis - scant on admission in setting of cough>>Improved 12/19  Plan: F/u Sputum culture Cont Monotherapy levaquin D2/x PCT protocol  Follow up 2 view CXR to review infiltrate>>PENDING Assess U. Strep antigen Consider CT chest / FOB if ongoing hemoptysis or continued worrisome findings on 2 view Cont Scheduled nebulized bronchodilators Taper steroids to PO Will need ambulatory assessment of O2 needs prior to discharge  Cough suppression with tussionex prn    VT - s/p ICD firing x2  HTN - off coreg for approx 2-3 days prior to admit HLD ICM  - EF 30% in 2014 by cath   Plan: Cont Coreg 12.5 BID Cont increase amio to 200 mg BID x 2 weeks No driving x 6 months post discharge  Follow up with Dr. Lovena Le as an outpatient  ASA  Continue home medications  GERD  Plan:  D/c PPI Use H2 blocker. D/t C Diff risk   DVT:  Lovenox, monitor closely with hx hempotysis SUP:  Pepcid  Mariel Sleet Beeper  [phone]  Cell  [phone]  If no response or cell goes to voicemail, call beeper [phone]   03/17/2014, 8:05 AM

## 2014-03-18 ENCOUNTER — Inpatient Hospital Stay (HOSPITAL_COMMUNITY): Payer: Medicare Other

## 2014-03-18 LAB — GLUCOSE, CAPILLARY
GLUCOSE-CAPILLARY: 83 mg/dL (ref 70–99)
Glucose-Capillary: 92 mg/dL (ref 70–99)
Glucose-Capillary: 105 mg/dL — ABNORMAL HIGH (ref 70–99)

## 2014-03-18 LAB — PROCALCITONIN: Procalcitonin: <0.1 ng/mL

## 2014-03-18 LAB — EXPECTORATED SPUTUM ASSESSMENT W REFEX TO RESP CULTURE

## 2014-03-18 LAB — EXPECTORATED SPUTUM ASSESSMENT W GRAM STAIN, RFLX TO RESP C

## 2014-03-18 MED ORDER — BISACODYL 5 MG PO TBEC
5.0000 mg | DELAYED_RELEASE_TABLET | Freq: Every day | ORAL | Status: DC | PRN
  Administered 2014-03-18: 5 mg via ORAL
  Filled 2014-03-18: qty 1

## 2014-03-18 MED ORDER — LACTULOSE 10 GM/15ML PO SOLN
10.0000 g | Freq: Once | ORAL | Status: AC
  Administered 2014-03-18: 10 g via ORAL
  Filled 2014-03-18: qty 15

## 2014-03-18 MED ORDER — IOHEXOL 300 MG/ML  SOLN
80.0000 mL | Freq: Once | INTRAMUSCULAR | Status: AC | PRN
  Administered 2014-03-18: 80 mL via INTRAVENOUS

## 2014-03-18 MED ORDER — BISACODYL 10 MG RE SUPP
10.0000 mg | Freq: Every day | RECTAL | Status: DC | PRN
  Administered 2014-03-18: 10 mg via RECTAL
  Filled 2014-03-18: qty 1

## 2014-03-18 NOTE — Progress Notes (Signed)
Name: Steven Guzman MRN: [id number] DOB: 1940-10-27    ADMISSION DATE:  03/16/2014  REFERRING MD :  Dr. Alvino Chapel   CHIEF COMPLAINT:  Hemoptysis, Cough  BRIEF PATIENT DESCRIPTION: 73 y/o M, current 1ppd smoker, admitted on 12/18 after AICD fired x2 and progressive cough, sputum production & SOB.    SIGNIFICANT EVENTS  12/18  Admit with hemoptysis, progressive cough, sputum production, SOB and AICD fire x2  STUDIES:  12/18  ICD Interrogation >> polymorphic VT   SUBJECTIVE:  Feels better, less dyspnea and cough, off oxygen  VITAL SIGNS: Temp:  [97.5 F (36.4 C)-98.5 F (36.9 C)] 98.5 F (36.9 C) (12/20 0952) Pulse Rate:  [64-82] 79 (12/20 0952) Resp:  [20] 20 (12/20 0506) BP: (113-117)/(63-79) 113/79 mmHg (12/20 0952) SpO2:  [92 %-96 %] 93 % (12/20 0952) FiO2 (%):  [20 %-28 %] 20 % (12/20 0952) Weight:  [97.6 kg (215 lb 2.7 oz)] 97.6 kg (215 lb 2.7 oz) (12/20 0506)  PHYSICAL EXAMINATION: General:  Chronically ill adult male in NAD Neuro:  AAOx4, speech clear, MAE HEENT:  Mm pink/moist, good dentition Cardiovascular:  s1s2 distant  Lungs: accessory muscle use at rest but no distress, significant bilateral wheezing  Abdomen:  Obese, soft, bsx4 active  Musculoskeletal:  No acute deformities  Skin:  Moist, warm, no edema    Recent Labs Lab 03/16/14 0854  NA 131*  K 4.8  CL 94*  CO2 23  BUN 11  CREATININE 0.83  GLUCOSE 121*    Recent Labs Lab 03/16/14 0854 03/17/14 0327  HGB 12.5* 12.7*  HCT 36.8* 38.4*  WBC 10.7* 9.9  PLT 291 346   Dg Chest 2 View  03/17/2014   CLINICAL DATA:  Pulmonary infiltrate.  EXAM: CHEST  2 VIEW  COMPARISON:  03/16/2014  FINDINGS: Multi lead AICD device overlies the left hemi thorax, leads are stable in position. Stable cardiac and mediastinal contours with tortuosity of the thoracic aorta. Persistent left suprahilar consolidative pulmonary opacity. The right lung is clear. No pleural effusion or pneumothorax.  IMPRESSION:  Persistent left suprahilar consolidative opacity concerning for pneumonia in the appropriate clinical setting. Continued radiographic followup to ensure resolution is recommended.   Electronically Signed   By: Lovey Newcomer M.D.   On: 03/17/2014 21:45   12/19 CXR : persistent PNA L hila Note CT Chest screening 09/2013: no CA seen  ASSESSMENT / PLAN:  R/O PNA - L suprahilar questionable infiltrate, ddx includes blood with vigorous coughing, HCAP, & atx.  Recent course of azithro as an outpatient.   Urine strep pneum>>NEG AECOPD  Continued Tobacco Abuse - ongoing 1ppd use Hypoxemia improved  Hemoptysis - scant on admission in setting of cough>>Improved 12/19  Plan: F/u Sputum culture Cont Monotherapy levaquin D3/x  Follow up 2 view CXR to review infiltrate>>PENDING Obtain  CT chest / FOB if ongoing hemoptysis or continued worrisome findings on 2 view Cont Scheduled nebulized bronchodilators Taper steroids to PO Will need ambulatory assessment of O2 needs prior to discharge  Cough suppression with tussionex prn    VT - s/p ICD firing x2  HTN - off coreg for approx 2-3 days prior to admit HLD ICM  - EF 30% in 2014 by cath   Plan: Cont Coreg 12.5 BID Cont increase amio to 200 mg BID x 2 weeks No driving x 6 months post discharge  Follow up with Dr. Lovena Le as an outpatient  ASA  Continue home medications  GERD  Plan:  D/c PPI Use  H2 blocker. D/t C Diff risk   DVT:  Lovenox, monitor closely with hx hempotysis SUP:  Pepcid  Glyn Ade  [phone]  Cell  [phone]  If no response or cell goes to voicemail, call beeper [phone]   03/18/2014, 11:23 AM

## 2014-03-18 NOTE — Progress Notes (Signed)
Removed oxygen to attempt to ween, spo2 at rest 92%. Pt educated and agreed to call if he became short of breath. Also pt has not had BM and will reassess later today.

## 2014-03-18 NOTE — Progress Notes (Signed)
Utilization Review Completed.Steven Guzman T12/20/2015  

## 2014-03-19 ENCOUNTER — Other Ambulatory Visit: Payer: Self-pay

## 2014-03-19 ENCOUNTER — Telehealth: Payer: Self-pay | Admitting: Pulmonary Disease

## 2014-03-19 DIAGNOSIS — R042 Hemoptysis: Secondary | ICD-10-CM

## 2014-03-19 MED ORDER — AMIODARONE HCL 200 MG PO TABS: ORAL_TABLET | ORAL | Status: DC

## 2014-03-19 MED ORDER — DOXYCYCLINE HYCLATE 100 MG PO TABS: 100.0000 mg | ORAL_TABLET | Freq: Two times a day (BID) | ORAL | Status: DC

## 2014-03-19 MED ORDER — LEVOFLOXACIN 750 MG PO TABS: 750.0000 mg | ORAL_TABLET | Freq: Every day | ORAL | Status: DC

## 2014-03-19 MED ORDER — PREDNISONE 10 MG PO TABS: ORAL_TABLET | ORAL | Status: DC

## 2014-03-19 MED ORDER — HYDROCOD POLST-CHLORPHEN POLST 10-8 MG/5ML PO LQCR: 5.0000 mL | Freq: Two times a day (BID) | ORAL | Status: DC | PRN

## 2014-03-19 MED ORDER — CARVEDILOL 12.5 MG PO TABS: 12.5000 mg | ORAL_TABLET | Freq: Two times a day (BID) | ORAL | Status: DC

## 2014-03-19 NOTE — Care Management Note (Signed)
    Page 1 of 1   03/19/2014     3:43:50 PM CARE MANAGEMENT NOTE 03/19/2014  Patient:  Steven Guzman, Steven Guzman   Account Number:  [id number]  Date Initiated:  03/19/2014  Documentation initiated by:  Tujuana Kilmartin  Subjective/Objective Assessment:   Pt adm on 03/16/14 with CAP.  PTA, pt resides at home with spouse and is independent.     Action/Plan:   Pt for dc home today; no dc needs identified.   Anticipated DC Date:  03/19/2014   Anticipated DC Plan:  [place]  CM consult      Choice offered to / List presented to:             Status of service:  Completed, signed off Medicare Important Message given?  YES (If response is "NO", the following Medicare IM given date fields will be blank) Date Medicare IM given:  03/19/2014 Medicare IM given by:  Lawrance Wiedemann Date Additional Medicare IM given:   Additional Medicare IM given by:    Discharge Disposition:  HOME/SELF CARE  Per UR Regulation:  Reviewed for med. necessity/level of care/duration of stay  If discussed at [place] of Stay Meetings, dates discussed:    Comments:

## 2014-03-19 NOTE — Telephone Encounter (Signed)
Doxycycline 100mg  bid x4 days

## 2014-03-19 NOTE — Telephone Encounter (Signed)
Called and LMOM at pharmacy with directions to cancel levaquin and give doxy

## 2014-03-19 NOTE — Discharge Summary (Signed)
Physician Discharge Summary  Patient ID: Steven Guzman MRN: [id number] DOB/AGE: 1940/05/29 73 y.o.  Admit date: 03/16/2014 Discharge date: 03/19/2014    Discharge Diagnoses:  Principal Problem:   CAP (community acquired pneumonia) Active Problems:   CAD (coronary artery disease)   Cardiomyopathy, ischemic   Ventricular tachycardia   Essential hypertension   ICD (implantable cardioverter-defibrillator) in place   Chronic systolic heart failure   COPD GOLD II   Ventricular fibrillation   Hemoptysis   COPD exacerbation    Brief Summary: Steven Guzman is a 73 y.o. y/o male active smoker with a PMH of GERD, CAD, ICM s/p AICD, Gold II COPD presented to [facility] 12/18 with a 2 week hx of progressive SOB, DOE, cough, increase in quantity and change in color of sputum to dark brown from yellow. He was recently seen in the Pulmonary office and treated with azithro. He followed up on 11/24 for ROV and was felt to have recovered from his recent flare. Despite frequent exacerbations, he continues to smoke on average 1ppd. On 12/18 he woke with cough and small amount bright red hemoptysis as well as low grade fever and myalgias.  He showered thinking he would come to pulmonary office, however, while in shower his AICD fired x 2 and EMS was called.  Evaluation of AICD was notable for VT.  Of note he ran out of coreg approx 2-3 days prior to admit r/t a pharmacy mix up.  He was admitted, treated with IV abx, solumedrol, nebulized BD's.  His urine strep was NEG.  He was seen in consultation by cardiology with recommendations for increasing his amiodarone to 228m BID x 2 weeks and will f/u as outpt with cardiology as well.  [name] improved quickly, near baseline respiratory status, tolerating PO abx and PO steroids.  Ready for d/c home pending close outpt f/u with CXR.   SIGNIFICANT EVENTS  12/18  Admit with hemoptysis, progressive cough, sputum production, SOB and AICD fire  x2  STUDIES:  12/18  ICD Interrogation >> polymorphic VT CT chest 12/20>>> LUL consolidation with areas of cavitation, likely necrotizing PNA, CAD                                                                     D/ plan by Discharge Diagnosis  Necrotizing PNA - L suprahilar infiltrate with areas of cavitation on CT chest.   Urine strep pneum>>NEG, recent course of azithro as outpt  AECOPD  Continued Tobacco Abuse - ongoing 1ppd use Hypoxemia improved  Hemoptysis - scant on admission in setting of cough>>Improved 12/19  Plan: Cont Monotherapy levaquin D4/14  Will f/u as outpt 1/11 with Dr. MLake Bellswith CXR and determine if further abx needed  If worsens or not improving will need to consider FOB Resume home symbicort, spiriva Cont po steroids and taper  Cough suppression with tussionex prn     VT - s/p ICD firing x2  HTN - off coreg for approx 2-3 days prior to admit HLD ICM  - EF 30% in 2014 by cath   Plan: Cont Coreg 12.5 BID Cont increase amio to 200 mg BID x 2 weeks (through 1/1) then back to 2088mdaily  No driving x  6 months post discharge  Follow up with Dr. Lovena Le as an outpatient  ASA  Continue home medications    Filed Vitals:   03/19/14 0157 03/19/14 0503 03/19/14 0851 03/19/14 0943  BP: 110/70 108/67  107/79  Pulse: 65 63 71 80  Temp: 97.7 F (36.5 C) 97.4 F (36.3 C)    TempSrc: Oral Oral    Resp: _0 Height:      Weight:  216 lb 0.8 oz (98 kg)    SpO2: 92% 90%       Discharge Labs  BMET  Recent Labs Lab 03/16/14 0854  NA 131*  K 4.8  CL 94*  CO2 23  GLUCOSE 121*  BUN 11  CREATININE 0.83  CALCIUM 9.7     CBC   Recent Labs Lab 03/16/14 0854 03/17/14 0327  HGB 12.5* 12.7*  HCT 36.8* 38.4*  WBC 10.7* 9.9  PLT 291 346   Anti-Coagulation No results for input(s): INR in the last 168 hours.         Follow-up Information    Follow up with Cristopher Peru, MD. Schedule an appointment as soon as possible for a  visit in 1 week.   Specialty:  Cardiology   Contact information:   [address] [place] [address] [phone]       Follow up with Simonne Maffucci, MD On 04/05/2014.   Specialty:  Pulmonary Disease   Why:  3:15pm with Chest xray   Contact information:   [place] [address] [phone]          Medication List    TAKE these medications        amiodarone 200 MG tablet  Commonly known as:  PACERONE  270m PO BID x 2 weeks (through 1/1) then back to previous 2052mPO daily     aspirin 81 MG tablet  Take 81 mg by mouth daily.     atorvastatin 80 MG tablet  Commonly known as:  LIPITOR  Take 80 mg by mouth daily.     benazepril 10 MG tablet  Commonly known as:  LOTENSIN  TAKE 1 TABLET BY MOUTH EVERY DAY     budesonide-formoterol 160-4.5 MCG/ACT inhaler  Commonly known as:  SYMBICORT  Take 2 puffs first thing in am and then another 2 puffs about 12 hours later.     busPIRone 15 MG tablet  Commonly known as:  BUSPAR  Take 15 mg by mouth 2 (two) times daily.     carvedilol 12.5 MG tablet  Commonly known as:  COREG  Take 12.5 mg by mouth 2 (two) times daily with a meal.     chlorpheniramine-HYDROcodone 10-8 MG/5ML Lqcr  Commonly known as:  TUSSIONEX  Take 5 mLs by mouth every 12 (twelve) hours as needed for cough.     Choline Fenofibrate 135 MG capsule  Commonly known as:  TRILIPIX  Take 1 capsule (135 mg total) by mouth daily.     furosemide 40 MG tablet  Commonly known as:  LASIX  Take 0.5 tablets by mouth daily.     GABAPENTIN PO  Take 300 mg by mouth 2 (two) times daily. 3 CAPSULES TWICE DAILY     HYDROcodone-acetaminophen 10-325 MG per tablet  Commonly known as:  NORCO  Take 1 tablet by mouth every 6 (six) hours as needed.     levofloxacin 750 MG tablet  Commonly known as:  LEVAQUIN  Take 1 tablet (750 mg  total) by mouth daily.     nitroGLYCERIN 0.4 MG SL tablet  Commonly known as:  NITROSTAT  Place 1 tablet (0.4  mg total) under the tongue every 5 (five) minutes as needed for chest pain.     omeprazole 20 MG capsule  Commonly known as:  PRILOSEC  Take 20 mg by mouth 2 (two) times daily.     PARoxetine 40 MG tablet  Commonly known as:  PAXIL  Take 40 mg by mouth every morning.     predniSONE 10 MG tablet  Commonly known as:  DELTASONE  Take 4 tabs PO daily x 3 days, then 3 tabs PO daily x 3 days, then 2 tabs PO daily x 3 days, then 1 tab PO daily x 3 days then, STOP     spironolactone 25 MG tablet  Commonly known as:  ALDACTONE  Take 0.5 tablets (12.5 mg total) by mouth daily.     tamsulosin 0.4 MG Caps capsule  Commonly known as:  FLOMAX  Take by mouth daily after supper.     tiotropium 18 MCG inhalation capsule  Commonly known as:  SPIRIVA  Place 1 capsule (18 mcg total) into inhaler and inhale daily.     vitamin B-12 1000 MCG tablet  Commonly known as:  CYANOCOBALAMIN  Take 1,000 mcg by mouth daily.          Disposition: 01-Home or Self Care  Discharged Condition: Steven Guzman has met maximum benefit of inpatient care and is medically stable and cleared for discharge.  Patient is pending follow up as above.      Time spent on disposition:  Greater than 35 minutes.   SignedDarlina Sicilian, NP 03/19/2014  11:05 AM Pager: [phone] or [phone]  Physician Statement:   The Patient was personally examined, the discharge assessment and plan has been personally reviewed and I agree with ACNP 's assessment and plan. > 30 minutes of time have been dedicated to discharge assessment, planning and discharge instructions.   Complete 14 days of levaquin , then reimage  Rigoberto Noel. MD

## 2014-03-19 NOTE — Progress Notes (Signed)
D/C'd IV; D/ C'd tele; D/C'd pt.  Explained discharge instructions to pt, and he had no further questions.  Pt in no acute distress.

## 2014-03-19 NOTE — Telephone Encounter (Signed)
I spoke with Estill Bamberg at [facility] and she states that there is an interaction between the levaquin and amiodarone. She states it is showing as a major interaction for the risk of arrythmias. I looked through the pt chart and I do not see that he has ever taken levaquin before. Please advise if ok to continue with rx or should another abx be called in. [place] Bing, CMA Allergies  Allergen Reactions  . Sulfa Antibiotics     hives

## 2014-03-20 LAB — CULTURE, RESPIRATORY: CULTURE: NORMAL

## 2014-03-20 LAB — CULTURE, RESPIRATORY W GRAM STAIN

## 2014-03-28 ENCOUNTER — Ambulatory Visit (INDEPENDENT_AMBULATORY_CARE_PROVIDER_SITE_OTHER): Payer: Medicare Other | Admitting: Internal Medicine

## 2014-03-28 ENCOUNTER — Encounter: Payer: Self-pay | Admitting: Internal Medicine

## 2014-03-28 VITALS — BP 84/68 | HR 91 | Ht 71.0 in | Wt 220.0 lb

## 2014-03-28 DIAGNOSIS — I5022 Chronic systolic (congestive) heart failure: Secondary | ICD-10-CM

## 2014-03-28 DIAGNOSIS — I10 Essential (primary) hypertension: Secondary | ICD-10-CM | POA: Diagnosis not present

## 2014-03-28 DIAGNOSIS — I472 Ventricular tachycardia, unspecified: Secondary | ICD-10-CM

## 2014-03-28 DIAGNOSIS — I2510 Atherosclerotic heart disease of native coronary artery without angina pectoris: Secondary | ICD-10-CM

## 2014-03-28 NOTE — Assessment & Plan Note (Signed)
He has had no recurrent ventricular tachycardia since his hospitalization. He will continue his current dose of amiodarone.

## 2014-03-28 NOTE — Patient Instructions (Signed)
Your physician wants you to follow-up in: 6 months with Dr [place] You will receive a reminder letter in the mail two months in advance. If you don't receive a letter, please call our office to schedule the follow-up appointment.  

## 2014-03-28 NOTE — Progress Notes (Signed)
HPI Steven Guzman returns today for ongoing evaluation and management of his ICD and ventricular tachycardia. He is a very pleasant 73 year old man with known coronary disease, status post MI, who developed ventricular tachycardia and underwent ICD implantation several years ago. Because of recurrent ventricular tachycardia, he was placed on amiodarone therapy. Overall he feels well, and denies chest pain or shortness of breath. He has been hospitalized with recurrent VT, and has had no episodes since discharge. His blood pressure has been low today, as he accidentally took a whole dose of Aldactone rather than his usual 12.5 mg daily. No other complaints. Allergies  Allergen Reactions  . Sulfa Antibiotics     hives     Current Outpatient Prescriptions  Medication Sig Dispense Refill  . amiodarone (PACERONE) 200 MG tablet 200mg  PO BID x 2 weeks (through 1/1) then back to previous 200mg  PO daily 60 tablet 0  . aspirin 81 MG tablet Take 81 mg by mouth daily.    [name] atorvastatin (LIPITOR) 80 MG tablet Take 80 mg by mouth daily.    . benazepril (LOTENSIN) 10 MG tablet TAKE 1 TABLET BY MOUTH EVERY DAY 30 tablet 3  . budesonide-formoterol (SYMBICORT) 160-4.5 MCG/ACT inhaler Take 2 puffs first thing in am and then another 2 puffs about 12 hours later. (Patient taking differently: Inhale 2 puffs into the lungs first thing in the am and then inhale another 2 puffs into the lungs about 12 hours later.) 1 Inhaler 11  . carvedilol (COREG) 12.5 MG tablet Take 1 tablet (12.5 mg total) by mouth 2 (two) times daily with a meal. 60 tablet 6  . chlorpheniramine-HYDROcodone (TUSSIONEX) 10-8 MG/5ML LQCR Take 5 mLs by mouth every 12 (twelve) hours as needed for cough. 115 mL 0  . Choline Fenofibrate (TRILIPIX) 135 MG capsule Take 1 capsule (135 mg total) by mouth daily. 30 capsule 8  . COMBIVENT RESPIMAT 20-100 MCG/ACT AERS respimat Inhale 2 puffs into the lungs every 6 (six) hours as needed. Shortness of breath  or wheezing    . doxycycline (VIBRA-TABS) 100 MG tablet Take 1 tablet (100 mg total) by mouth 2 (two) times daily. 8 tablet 0  . furosemide (LASIX) 40 MG tablet Take 0.5 tablets by mouth daily.    [name] gabapentin (NEURONTIN) 100 MG capsule Take 300 mg by mouth 2 (two) times daily.   4  . HYDROcodone-acetaminophen (NORCO) 10-325 MG per tablet Take 1 tablet by mouth every 6 (six) hours as needed. (Patient taking differently: Take 1 tablet by mouth every 6 (six) hours as needed (pain). ) 30 tablet 0  . levofloxacin (LEVAQUIN) 750 MG tablet Take 1 tablet (750 mg total) by mouth daily. 14 tablet 0  . nitroGLYCERIN (NITROSTAT) 0.4 MG SL tablet Place 1 tablet (0.4 mg total) under the tongue every 5 (five) minutes as needed for chest pain. 25 tablet 12  . omeprazole (PRILOSEC) 20 MG capsule Take 20 mg by mouth 2 (two) times daily.    [name] PARoxetine (PAXIL) 40 MG tablet Take 40 mg by mouth every morning.    . predniSONE (DELTASONE) 10 MG tablet Take 4 tabs PO daily x 3 days, then 3 tabs PO daily x 3 days, then 2 tabs PO daily x 3 days, then 1 tab PO daily x 3 days then, STOP 30 tablet 0  . spironolactone (ALDACTONE) 25 MG tablet Take 0.5 tablets (12.5 mg total) by mouth daily.    . tamsulosin (FLOMAX) 0.4 MG CAPS capsule Take  0.4 mg by mouth daily after supper.     . tiotropium (SPIRIVA) 18 MCG inhalation capsule Place 1 capsule (18 mcg total) into inhaler and inhale daily. 30 capsule 3  . vitamin B-12 (CYANOCOBALAMIN) 1000 MCG tablet Take 1,000 mcg by mouth daily.     No current facility-administered medications for this visit.     Past Medical History  Diagnosis Date  . Hypertension   . Hyperlipidemia   . COPD (chronic obstructive pulmonary disease)     a. followed by pulmonary, COPD GOLD stage II  . GERD (gastroesophageal reflux disease)   . Diverticulitis   . CAD (coronary artery disease)     a. s/p MI in 1994 with PCI to LAD at that time b. cath 10/2012 demonstrated EF 30%, inferior akinesis with  mild hypokinesis of all walls, patent LAD and RCA stents; ostial PDA with 80-90% obstruction with medical therapy recommended   . Depression   . Aneurysm     abdominal <4  . Tobacco abuse     ROS:   All systems reviewed and negative except as noted in the HPI.   Past Surgical History  Procedure Laterality Date  . Hernia repair    . Foot surgery      lt  . Hemorrhoid banding    . Retinal detachment surgery      right  . Angioplasty    . Cardiac catheterization    . Nose surgery      skin cancer-mohs-skin graft  . Colonoscopy    . Tenolysis Right 12/21/2013    Procedure: TENOLYSIS FLEXOR CARPI RADIALIS ,DEBRIDEMENT RIGHT JOINT WRIST,DEBRIDEMENT SCAPHOTRAPEZIAL TRAPEZOID, REPAIR OF EXTENSOR HOOD;  Surgeon: Daryll Brod, MD;  Location: [facility];  Service: Orthopedics;  Laterality: Right;  . Left heart catheterization with coronary angiogram N/A 11/25/2012    demonstrated EF 30%, inferior akinesis with mild hypokinesis of all walls, patent LAD and RCA stents; ostial PDA with 80-90% obstruction with medical therapy recommended     Family History  Problem Relation Age of Onset  . Heart attack Brother   . CAD Father   . CAD Mother      History   Social History  . Marital Status: Married    Spouse Name: N/A    Number of Children: N/A  . Years of Education: N/A   Occupational History  . Not on file.   Social History Main Topics  . Smoking status: Current Every Day Smoker -- 0.50 packs/day for 55 years    Types: Cigarettes  . Smokeless tobacco: Never Used  . Alcohol Use: 0.0 oz/week    0 Not specified per week     Comment: occasionally   . Drug Use: No  . Sexual Activity: Not Currently   Other Topics Concern  . Not on file   Social History Narrative     BP 84/68 mmHg  Pulse 91  Ht 5\' 11"  (1.803 m)  Wt 220 lb (99.791 kg)  BMI 30.70 kg/m2  Physical Exam:  Well appearing 73 year old man, NAD HEENT: Unremarkable Neck:  No JVD, no  thyromegally Back:  No CVA tenderness Lungs:  Clear with no wheezes, rales, or rhonchi. HEART:  Regular rate rhythm, no murmurs, no rubs, no clicks Abd:  soft, positive bowel sounds, no organomegally, no rebound, no guarding Ext:  2 plus pulses, no edema, no cyanosis, no clubbing Skin:  No rashes no nodules Neuro:  CN II through XII intact, motor grossly intact  DEVICE  Normal  device function.  See PaceArt for details.   Assess/Plan:

## 2014-03-28 NOTE — Assessment & Plan Note (Signed)
His symptoms are class 2. He will continue his current medical therapy. He is encouraged to reduce his sodium intake.

## 2014-03-28 NOTE — Assessment & Plan Note (Signed)
His blood pressure is low today. He states that it runs ok at home but that it is low today because of his taking an extra half of aldactone.

## 2014-04-03 ENCOUNTER — Encounter: Payer: Medicare Other | Admitting: Internal Medicine

## 2014-04-04 DIAGNOSIS — M7661 Achilles tendinitis, right leg: Secondary | ICD-10-CM | POA: Diagnosis not present

## 2014-04-04 DIAGNOSIS — M7662 Achilles tendinitis, left leg: Secondary | ICD-10-CM | POA: Diagnosis not present

## 2014-04-04 DIAGNOSIS — M24573 Contracture, unspecified ankle: Secondary | ICD-10-CM | POA: Diagnosis not present

## 2014-04-04 DIAGNOSIS — M24576 Contracture, unspecified foot: Secondary | ICD-10-CM | POA: Diagnosis not present

## 2014-04-05 ENCOUNTER — Encounter: Payer: Self-pay | Admitting: Pulmonary Disease

## 2014-04-05 ENCOUNTER — Ambulatory Visit (INDEPENDENT_AMBULATORY_CARE_PROVIDER_SITE_OTHER)
Admission: RE | Admit: 2014-04-05 | Discharge: 2014-04-05 | Disposition: A | Discharge status: Home / Self Care | Payer: Medicare Other | Source: Ambulatory Visit | Attending: Pulmonary Disease | Admitting: Pulmonary Disease

## 2014-04-05 ENCOUNTER — Ambulatory Visit (INDEPENDENT_AMBULATORY_CARE_PROVIDER_SITE_OTHER): Payer: Medicare Other | Admitting: Pulmonary Disease

## 2014-04-05 VITALS — BP 126/68 | HR 60 | Ht 71.0 in | Wt 215.0 lb

## 2014-04-05 DIAGNOSIS — Z720 Tobacco use: Secondary | ICD-10-CM | POA: Diagnosis not present

## 2014-04-05 DIAGNOSIS — F17200 Nicotine dependence, unspecified, uncomplicated: Secondary | ICD-10-CM | POA: Diagnosis not present

## 2014-04-05 DIAGNOSIS — J189 Pneumonia, unspecified organism: Secondary | ICD-10-CM

## 2014-04-05 DIAGNOSIS — F17210 Nicotine dependence, cigarettes, uncomplicated: Secondary | ICD-10-CM

## 2014-04-05 DIAGNOSIS — J449 Chronic obstructive pulmonary disease, unspecified: Secondary | ICD-10-CM | POA: Diagnosis not present

## 2014-04-05 DIAGNOSIS — I517 Cardiomegaly: Secondary | ICD-10-CM | POA: Diagnosis not present

## 2014-04-05 NOTE — Assessment & Plan Note (Signed)
Despite the recent pneumonia this has been a stable interval in terms of his COPD. He clearly needs to stop smoking immediately.  Plan: -continue Spiriva and Symbicort

## 2014-04-05 NOTE — Assessment & Plan Note (Signed)
I have personally reviewed the images of the CT chest from December 2015 when he was admitted with community-acquired pneumonia. He has clinically improved since then and that his shortness of breath as well as cough has resolved.  Fortunately, we have the benefit of a July 2015 CT chest which is essentially normal so the left upper lobe lesion is much less worrisome in terms of considering malignancy. I do believe it was all related to a necrotizing pneumonia at this time. However, we do need to follow this lesion until it resolves radiographically.  Plan: -Chest x-ray today -Repeat CT chest in 3 months  -follow-up 3 months

## 2014-04-05 NOTE — Patient Instructions (Signed)
Keep taking your medicines as written Quit smoking We will see you back in 2 months or sooner if needed

## 2014-04-05 NOTE — Progress Notes (Signed)
Subjective:    Patient ID: Steven Guzman, male    DOB: 1941-01-04, 74 y.o.   MRN: [id number]  Synopsis: GOLD Grade C COPD, still actively smoking as of November 2015 10/2013 PFT> ratio 61%, FEV1 2.53L (77% pred, 15% change), TLC 8.65 L (117% pred), RV 4.25L (161% pred), DLCO 25.39 (73% pred)  HPI Chief Complaint  Patient presents with  . Hospitalization Follow-up    Pt recently hospitalized for pna at [facility].  Pt c/o sob with exertion, especially with weight.  Pt has occasional prod cough with yellow/clear mucus.    Ohn had a hospitalization recently for necrotizing pneumonia.  He said that his defibilator shocked him a couple of times and he went to the ED for evalution where he had a CXR that showed necrotizing pneumonia.  He said he had been feeling back and muscle aches and felt febrile before all this. He coughed up blood during the morning before this all occurred. He was discharged home on prednisone and levaquin.  Still smoking, but his wife has enrolled them in a stop smoking class in the [place] office in [facility].    Past Medical History  Diagnosis Date  . Hypertension   . Hyperlipidemia   . COPD (chronic obstructive pulmonary disease)     a. followed by pulmonary, COPD GOLD stage II  . GERD (gastroesophageal reflux disease)   . Diverticulitis   . CAD (coronary artery disease)     a. s/p MI in 1994 with PCI to LAD at that time b. cath 10/2012 demonstrated EF 30%, inferior akinesis with mild hypokinesis of all walls, patent LAD and RCA stents; ostial PDA with 80-90% obstruction with medical therapy recommended   . Depression   . Aneurysm     abdominal <4  . Tobacco abuse      Review of Systems  Constitutional: Negative for fever, chills and fatigue.  HENT: Negative for postnasal drip, rhinorrhea and sinus pressure.   Respiratory: Negative for cough, shortness of breath and wheezing.   Cardiovascular: Negative for chest pain, palpitations and leg swelling.         Objective:   Physical Exam Filed Vitals:   04/05/14 1516  BP: 126/68  Pulse: 60  Height: 5\' 11"  (1.803 m)  Weight: 215 lb (97.523 kg)  SpO2: 96%   RA  Gen: well appearing, no acute distress HEENT: NCAT, EOMi, OP clear PULM: mild wheezing bilat, good air movement CV: RRR, no mgr, no JVD AB: BS+, soft, nontender Ext: warm, no edema, no clubbing, no cyanosis Derm: no rash or skin breakdown Neuro: A&Ox4, MAEW  December 2015 CT chest > images reviewed in clinic,There is a necrotizing pneumonia and left upper lobe with surrounding groundglass and central gas     Assessment & Plan:   CAP (community acquired pneumonia) I have personally reviewed the images of the CT chest from December 2015 when he was admitted with community-acquired pneumonia. He has clinically improved since then and that his shortness of breath as well as cough has resolved.  Fortunately, we have the benefit of a July 2015 CT chest which is essentially normal so the left upper lobe lesion is much less worrisome in terms of considering malignancy. I do believe it was all related to a necrotizing pneumonia at this time. However, we do need to follow this lesion until it resolves radiographically.  Plan: -Chest x-ray today -Repeat CT chest in 3 months  -follow-up 3 months  COPD GOLD II Despite the  recent pneumonia this has been a stable interval in terms of his COPD. He clearly needs to stop smoking immediately.  Plan: -continue Spiriva and Symbicort  Cigarette smoker Educated at length today to quit smoking. He would like to consider Chantix but he is nervous about it because of his depression. I explained to him that since his depression has not been active in a number of years and he lives with his wife who can keep a close eye on him I would be okay with him taking Chantix. However, we re-reviewed the side effect profile of Chantix which does include suicidality and abnormal thoughts. He is going to go home  and think about it. He is currently enrolled in a smoking cessation class through [facility].  > 25 minutes in direct consultation today  Updated Medication List Outpatient Encounter Prescriptions as of 04/05/2014  Medication Sig  . amiodarone (PACERONE) 200 MG tablet 200mg  PO BID x 2 weeks (through 1/1) then back to previous 200mg  PO daily  . aspirin 81 MG tablet Take 81 mg by mouth daily.  [name] atorvastatin (LIPITOR) 80 MG tablet Take 80 mg by mouth daily.  . benazepril (LOTENSIN) 10 MG tablet TAKE 1 TABLET BY MOUTH EVERY DAY  . budesonide-formoterol (SYMBICORT) 160-4.5 MCG/ACT inhaler Take 2 puffs first thing in am and then another 2 puffs about 12 hours later. (Patient taking differently: Inhale 2 puffs into the lungs first thing in the am and then inhale another 2 puffs into the lungs about 12 hours later.)  . carvedilol (COREG) 12.5 MG tablet Take 1 tablet (12.5 mg total) by mouth 2 (two) times daily with a meal.  . Choline Fenofibrate (TRILIPIX) 135 MG capsule Take 1 capsule (135 mg total) by mouth daily.  . COMBIVENT RESPIMAT 20-100 MCG/ACT AERS respimat Inhale 2 puffs into the lungs every 6 (six) hours as needed. Shortness of breath or wheezing  . furosemide (LASIX) 40 MG tablet Take 0.5 tablets by mouth daily.  [name] gabapentin (NEURONTIN) 100 MG capsule Take 300 mg by mouth 2 (two) times daily.   [name] HYDROcodone-acetaminophen (NORCO) 10-325 MG per tablet Take 1 tablet by mouth every 6 (six) hours as needed. (Patient taking differently: Take 1 tablet by mouth every 6 (six) hours as needed (pain). )  . nitroGLYCERIN (NITROSTAT) 0.4 MG SL tablet Place 1 tablet (0.4 mg total) under the tongue every 5 (five) minutes as needed for chest pain.  [name] omeprazole (PRILOSEC) 20 MG capsule Take 20 mg by mouth 2 (two) times daily.  [name] PARoxetine (PAXIL) 40 MG tablet Take 40 mg by mouth every morning.  [name] spironolactone (ALDACTONE) 25 MG tablet Take 0.5 tablets (12.5 mg total) by mouth daily.  . tamsulosin  (FLOMAX) 0.4 MG CAPS capsule Take 0.4 mg by mouth daily after supper.   . tiotropium (SPIRIVA) 18 MCG inhalation capsule Place 1 capsule (18 mcg total) into inhaler and inhale daily.  . vitamin B-12 (CYANOCOBALAMIN) 1000 MCG tablet Take 1,000 mcg by mouth daily.  . chlorpheniramine-HYDROcodone (TUSSIONEX) 10-8 MG/5ML LQCR Take 5 mLs by mouth every 12 (twelve) hours as needed for cough. (Patient not taking: Reported on 04/05/2014)  . [DISCONTINUED] doxycycline (VIBRA-TABS) 100 MG tablet Take 1 tablet (100 mg total) by mouth 2 (two) times daily.  . [DISCONTINUED] levofloxacin (LEVAQUIN) 750 MG tablet Take 1 tablet (750 mg total) by mouth daily.  . [DISCONTINUED] predniSONE (DELTASONE) 10 MG tablet Take 4 tabs PO daily x 3 days, then 3 tabs PO daily x 3  days, then 2 tabs PO daily x 3 days, then 1 tab PO daily x 3 days then, STOP

## 2014-04-05 NOTE — Assessment & Plan Note (Signed)
Educated at length today to quit smoking. He would like to consider Chantix but he is nervous about it because of his depression. I explained to him that since his depression has not been active in a number of years and he lives with his wife who can keep a close eye on him I would be okay with him taking Chantix. However, we re-reviewed the side effect profile of Chantix which does include suicidality and abnormal thoughts. He is going to go home and think about it. He is currently enrolled in a smoking cessation class through [facility].

## 2014-04-06 NOTE — Progress Notes (Signed)
Quick Note:  lmtcb for pt. ______ 

## 2014-04-09 ENCOUNTER — Telehealth: Payer: Self-pay | Admitting: Pulmonary Disease

## 2014-04-09 NOTE — Telephone Encounter (Signed)
Per CXR done 04/05/14: Please let him know that this is better   I spoke with patient about results and he verbalized understanding and had no questions

## 2014-04-12 ENCOUNTER — Other Ambulatory Visit: Payer: Self-pay

## 2014-04-12 ENCOUNTER — Encounter (HOSPITAL_BASED_OUTPATIENT_CLINIC_OR_DEPARTMENT_OTHER): Payer: Self-pay | Admitting: Orthopedic Surgery

## 2014-04-12 MED ORDER — ATORVASTATIN CALCIUM 80 MG PO TABS: 80.0000 mg | ORAL_TABLET | Freq: Every day | ORAL | Status: DC

## 2014-04-17 ENCOUNTER — Telehealth: Payer: Self-pay

## 2014-04-17 ENCOUNTER — Telehealth: Payer: Self-pay | Admitting: Interventional Cardiology

## 2014-04-17 NOTE — Telephone Encounter (Signed)
Pt aware of S.Weaver,PA recommendation to f/u with his podiatrist. The swelling in his left foot is probably related to his injury not his heart. Pt verbalized understanding.

## 2014-04-17 NOTE — Telephone Encounter (Signed)
New message     Pt C/O swelling: STAT is pt has developed SOB within 24 hours  1. How long have you been experiencing swelling? Couple of weeks   2. Where is the swelling located? Right leg  3.  Are you currently taking a "fluid pill"? Yes   4.  Are you currently SOB? H/O copd   5.  Have you traveled recently? No

## 2014-04-17 NOTE — Telephone Encounter (Signed)
Returned pt call. He reports swelling in his left foot and ankle over the last 2 weeks. He was having pain in his feet and decided to see a podiatrist. He was told he aggravated his achilles tendon. He was told to ice and elevate his feet, and use Ibuprofen prn. He has done that for the last 2 weeks with no improvement. He denies SOB, swelling is localized to his left foot. He does not have a scale and does not weigh daily. His Spironolactone was reduced in Dec to 12.5mg  qd. He was advised to call the office if he had any swelling so he decided to call. Adv him Dr.Smith is out of the office today, I will tal;k with another physician in the office and call back with their recommendation. Pt verbalized understanding.

## 2014-04-23 DIAGNOSIS — M7542 Impingement syndrome of left shoulder: Secondary | ICD-10-CM | POA: Diagnosis not present

## 2014-04-25 DIAGNOSIS — M7661 Achilles tendinitis, right leg: Secondary | ICD-10-CM | POA: Diagnosis not present

## 2014-04-25 DIAGNOSIS — M7662 Achilles tendinitis, left leg: Secondary | ICD-10-CM | POA: Diagnosis not present

## 2014-04-25 DIAGNOSIS — M24573 Contracture, unspecified ankle: Secondary | ICD-10-CM | POA: Diagnosis not present

## 2014-04-25 DIAGNOSIS — M24576 Contracture, unspecified foot: Secondary | ICD-10-CM | POA: Diagnosis not present

## 2014-04-27 DIAGNOSIS — R110 Nausea: Secondary | ICD-10-CM | POA: Diagnosis not present

## 2014-04-27 DIAGNOSIS — J984 Other disorders of lung: Secondary | ICD-10-CM | POA: Diagnosis not present

## 2014-04-27 DIAGNOSIS — R10816 Epigastric abdominal tenderness: Secondary | ICD-10-CM | POA: Diagnosis not present

## 2014-04-27 DIAGNOSIS — R1011 Right upper quadrant pain: Secondary | ICD-10-CM | POA: Diagnosis not present

## 2014-04-28 DIAGNOSIS — R1011 Right upper quadrant pain: Secondary | ICD-10-CM | POA: Diagnosis not present

## 2014-04-28 DIAGNOSIS — J984 Other disorders of lung: Secondary | ICD-10-CM | POA: Diagnosis not present

## 2014-05-01 ENCOUNTER — Other Ambulatory Visit: Payer: Self-pay | Admitting: *Deleted

## 2014-05-02 NOTE — Telephone Encounter (Signed)
No longer our patient

## 2014-05-11 ENCOUNTER — Ambulatory Visit (INDEPENDENT_AMBULATORY_CARE_PROVIDER_SITE_OTHER): Payer: Medicare Other | Admitting: Internal Medicine

## 2014-05-11 ENCOUNTER — Encounter: Payer: Self-pay | Admitting: Internal Medicine

## 2014-05-11 VITALS — BP 118/64 | HR 61 | Temp 98.1°F | Resp 20 | Ht 71.0 in | Wt 223.0 lb

## 2014-05-11 DIAGNOSIS — Z418 Encounter for other procedures for purposes other than remedying health state: Secondary | ICD-10-CM

## 2014-05-11 DIAGNOSIS — Z23 Encounter for immunization: Secondary | ICD-10-CM

## 2014-05-11 DIAGNOSIS — Z0000 Encounter for general adult medical examination without abnormal findings: Secondary | ICD-10-CM | POA: Diagnosis not present

## 2014-05-11 DIAGNOSIS — J189 Pneumonia, unspecified organism: Secondary | ICD-10-CM

## 2014-05-11 DIAGNOSIS — J449 Chronic obstructive pulmonary disease, unspecified: Secondary | ICD-10-CM

## 2014-05-11 DIAGNOSIS — I5022 Chronic systolic (congestive) heart failure: Secondary | ICD-10-CM

## 2014-05-11 DIAGNOSIS — Z299 Encounter for prophylactic measures, unspecified: Secondary | ICD-10-CM

## 2014-05-11 NOTE — Progress Notes (Signed)
Pre visit review using our clinic review tool, if applicable. No additional management support is needed unless otherwise documented below in the visit note. 

## 2014-05-11 NOTE — Patient Instructions (Addendum)
We will wait until you are ready to do the upper endoscopy and colon cancer screening. Just let us know when you are ready.   You can continue taking the pepcid along with the prilosec for the stomach.   We want you to be sure to get the CT scan of the lungs in another 2-3 months to check on the pneumonia.  We will see you back in about 3-6 months. I will send a note to Dr. Tamala Julian to see if they need to check on the aneurysm in the stomach again.   The most important thing you can do for your health is to stop smoking! This will help the heart and lungs stay healthy.   We have given you the tetanus shot and the pneumonia booster shot.   Food Choices for Gastroesophageal Reflux Disease When you have gastroesophageal reflux disease (GERD), the foods you eat and your eating habits are very important. Choosing the right foods can help ease the discomfort of GERD. WHAT GENERAL GUIDELINES DO I NEED TO FOLLOW?  Choose fruits, vegetables, whole grains, low-fat dairy products, and low-fat meat, fish, and poultry.  Limit fats such as oils, salad dressings, butter, nuts, and avocado.  Keep a food diary to identify foods that cause symptoms.  Avoid foods that cause reflux. These may be different for different people.  Eat frequent small meals instead of three large meals each day.  Eat your meals slowly, in a relaxed setting.  Limit fried foods.  Cook foods using methods other than frying.  Avoid drinking alcohol.  Avoid drinking large amounts of liquids with your meals.  Avoid bending over or lying down until 2-3 hours after eating. WHAT FOODS ARE NOT RECOMMENDED? The following are some foods and drinks that may worsen your symptoms: Vegetables Tomatoes. Tomato juice. Tomato and spaghetti sauce. Chili peppers. Onion and garlic. Horseradish. Fruits Oranges, grapefruit, and lemon (fruit and juice). Meats High-fat meats, fish, and poultry. This includes hot dogs, ribs, ham, sausage,  salami, and bacon. Dairy Whole milk and chocolate milk. Sour cream. Cream. Butter. Ice cream. Cream cheese.  Beverages Coffee and tea, with or without caffeine. Carbonated beverages or energy drinks. Condiments Hot sauce. Barbecue sauce.  Sweets/Desserts Chocolate and cocoa. Donuts. Peppermint and spearmint. Fats and Oils High-fat foods, including [place] fries and potato chips. Other Vinegar. Strong spices, such as black pepper, white pepper, red pepper, cayenne, curry powder, cloves, ginger, and chili powder. The items listed above may not be a complete list of foods and beverages to avoid. Contact your dietitian for more information. Document Released: 03/16/2005 Document Revised: 03/21/2013 Document Reviewed: 01/18/2013 [facility] Patient Information 2015 [place], [place]. This information is not intended to replace advice given to you by your health care provider. Make sure you discuss any questions you have with your health care provider.

## 2014-05-12 DIAGNOSIS — Z0000 Encounter for general adult medical examination without abnormal findings: Secondary | ICD-10-CM | POA: Insufficient documentation

## 2014-05-12 NOTE — Assessment & Plan Note (Signed)
Overdue for colonoscopy but given his recent medical expenses he would like to wait a few months. Given tdap and prevnar 13 today. Has had pneumonia shot and flu shot and shingles shot. BP controlled. Still smoking and not much exercise.

## 2014-05-12 NOTE — Assessment & Plan Note (Signed)
Not in exacerbation today. Still smoking and reminded him that he is destroying his lungs.

## 2014-05-12 NOTE — Assessment & Plan Note (Signed)
Not in exacerbation. Weights stable at home. Taking his medications regularly (skipped some doses and ended up in v fib with 2 shocks this December). No shocks since then. Last EF 30%.

## 2014-05-12 NOTE — Progress Notes (Signed)
   Subjective:    Patient ID: Steven Guzman, male    DOB: 06-Mar-1941, 74 y.o.   MRN: [id number]  HPI The patient is a 74 YO man who is coming in as a new patient for wellness. He does have some chronic medical problems (please see A/P for status and treatment) but no new complaints. He thinks he is behind on some screening tests.   Diet: heart healthy Physical activity: sedentary Depression/mood screen: negative Hearing: intact to whispered voice Visual acuity: grossly normal ADLs: capable Fall risk: none Home safety: good Cognitive evaluation: intact to orientation, naming, recall and repetition EOL planning: adv directives discussed  I have personally reviewed and have noted 1. The patient's medical and social history 2. Their use of alcohol, tobacco or illicit drugs 3. Their current medications and supplements 4. The patient's functional ability including ADL's, fall risks, home safety risks and hearing or visual impairment. 5. Diet and physical activities 6. Evidence for depression or mood disorders  Review of Systems  Constitutional: Negative for fever, activity change, appetite change, fatigue and unexpected weight change.  HENT: Negative.   Eyes: Negative.   Respiratory: Negative for cough, chest tightness, shortness of breath and wheezing.   Cardiovascular: Negative for chest pain, palpitations and leg swelling.  Gastrointestinal: Negative for abdominal pain, diarrhea, constipation and abdominal distention.  Endocrine: Negative.   Musculoskeletal: Positive for arthralgias.  Skin: Negative.   Neurological: Negative.   Psychiatric/Behavioral: Negative.       Objective:   Physical Exam  Constitutional: He is oriented to person, place, and time. He appears well-developed and well-nourished.  HENT:  Head: Normocephalic and atraumatic.  Eyes: EOM are normal.  Neck: Normal range of motion.  Cardiovascular: Normal rate.   Pulmonary/Chest: Effort normal and breath  sounds normal.  Abdominal: Soft. Bowel sounds are normal. He exhibits no distension. There is no tenderness. There is no rebound.  Musculoskeletal: He exhibits no edema.  Neurological: He is alert and oriented to person, place, and time.  Skin: Skin is warm and dry.  Psychiatric: He has a normal mood and affect. His behavior is normal.   Filed Vitals:   05/11/14 0834  BP: 118/64  Pulse: 61  Temp: 98.1 F (36.7 C)  TempSrc: Oral  Resp: 20  Height: 5\' 11"  (1.803 m)  Weight: 223 lb (101.152 kg)  SpO2: 93%      Assessment & Plan:  Tdap and prevnar 13 given at visit

## 2014-05-12 NOTE — Assessment & Plan Note (Signed)
Recent CXR showed some resolution of the pneumonia but needs CT in 2 months for follow up with his history.

## 2014-05-15 ENCOUNTER — Other Ambulatory Visit: Payer: Self-pay | Admitting: Internal Medicine

## 2014-05-21 ENCOUNTER — Other Ambulatory Visit: Payer: Self-pay | Admitting: Geriatric Medicine

## 2014-05-21 MED ORDER — TAMSULOSIN HCL 0.4 MG PO CAPS: 0.4000 mg | ORAL_CAPSULE | Freq: Every day | ORAL | Status: DC

## 2014-05-24 DIAGNOSIS — M7662 Achilles tendinitis, left leg: Secondary | ICD-10-CM | POA: Diagnosis not present

## 2014-05-24 DIAGNOSIS — M24576 Contracture, unspecified foot: Secondary | ICD-10-CM | POA: Diagnosis not present

## 2014-05-24 DIAGNOSIS — M24573 Contracture, unspecified ankle: Secondary | ICD-10-CM | POA: Diagnosis not present

## 2014-05-24 DIAGNOSIS — M7661 Achilles tendinitis, right leg: Secondary | ICD-10-CM | POA: Diagnosis not present

## 2014-05-31 DIAGNOSIS — M24576 Contracture, unspecified foot: Secondary | ICD-10-CM | POA: Diagnosis not present

## 2014-05-31 DIAGNOSIS — M24573 Contracture, unspecified ankle: Secondary | ICD-10-CM | POA: Diagnosis not present

## 2014-05-31 DIAGNOSIS — M7661 Achilles tendinitis, right leg: Secondary | ICD-10-CM | POA: Diagnosis not present

## 2014-05-31 DIAGNOSIS — M7662 Achilles tendinitis, left leg: Secondary | ICD-10-CM | POA: Diagnosis not present

## 2014-06-04 DIAGNOSIS — M7662 Achilles tendinitis, left leg: Secondary | ICD-10-CM | POA: Diagnosis not present

## 2014-06-04 DIAGNOSIS — M7661 Achilles tendinitis, right leg: Secondary | ICD-10-CM | POA: Diagnosis not present

## 2014-06-11 DIAGNOSIS — M7662 Achilles tendinitis, left leg: Secondary | ICD-10-CM | POA: Diagnosis not present

## 2014-06-11 DIAGNOSIS — M7661 Achilles tendinitis, right leg: Secondary | ICD-10-CM | POA: Diagnosis not present

## 2014-06-18 DIAGNOSIS — M7661 Achilles tendinitis, right leg: Secondary | ICD-10-CM | POA: Diagnosis not present

## 2014-06-18 DIAGNOSIS — M24576 Contracture, unspecified foot: Secondary | ICD-10-CM | POA: Diagnosis not present

## 2014-06-18 DIAGNOSIS — M7662 Achilles tendinitis, left leg: Secondary | ICD-10-CM | POA: Diagnosis not present

## 2014-06-18 DIAGNOSIS — M24573 Contracture, unspecified ankle: Secondary | ICD-10-CM | POA: Diagnosis not present

## 2014-06-20 DIAGNOSIS — M7542 Impingement syndrome of left shoulder: Secondary | ICD-10-CM | POA: Diagnosis not present

## 2014-06-25 ENCOUNTER — Telehealth: Payer: Self-pay | Admitting: Pulmonary Disease

## 2014-06-25 DIAGNOSIS — M24573 Contracture, unspecified ankle: Secondary | ICD-10-CM | POA: Diagnosis not present

## 2014-06-25 DIAGNOSIS — M7661 Achilles tendinitis, right leg: Secondary | ICD-10-CM | POA: Diagnosis not present

## 2014-06-25 DIAGNOSIS — M7662 Achilles tendinitis, left leg: Secondary | ICD-10-CM | POA: Diagnosis not present

## 2014-06-25 DIAGNOSIS — M24576 Contracture, unspecified foot: Secondary | ICD-10-CM | POA: Diagnosis not present

## 2014-06-25 MED ORDER — TIOTROPIUM BROMIDE MONOHYDRATE 18 MCG IN CAPS: 18.0000 ug | ORAL_CAPSULE | Freq: Every day | RESPIRATORY_TRACT | Status: DC

## 2014-06-25 NOTE — Telephone Encounter (Signed)
Pt spouse aware RX has been sent. Nothing further needed

## 2014-07-18 ENCOUNTER — Other Ambulatory Visit: Payer: Self-pay

## 2014-07-18 ENCOUNTER — Other Ambulatory Visit: Payer: Self-pay | Admitting: Internal Medicine

## 2014-07-18 MED ORDER — AMIODARONE HCL 200 MG PO TABS: 200.0000 mg | ORAL_TABLET | Freq: Every day | ORAL | Status: DC

## 2014-07-18 NOTE — Telephone Encounter (Signed)
[name], MD at 03/28/2014 4:19 PM  amiodarone (PACERONE) 200 MG tablet 200mg  PO BID x 2 weeks (through 1/1) then back to previous 200mg  PO daily  Ventricular tachycardia - [name], MD at 03/28/2014 5:06 PM    Status: Written Related Problem: Ventricular tachycardia  Expand All Collapse All    He has had no recurrent ventricular tachycardia since his hospitalization. He will continue his current dose of amiodarone.

## 2014-07-20 ENCOUNTER — Other Ambulatory Visit: Payer: Self-pay | Admitting: *Deleted

## 2014-07-20 MED ORDER — BENAZEPRIL HCL 10 MG PO TABS: 10.0000 mg | ORAL_TABLET | Freq: Every day | ORAL | Status: DC

## 2014-07-27 DIAGNOSIS — Q742 Other congenital malformations of lower limb(s), including pelvic girdle: Secondary | ICD-10-CM | POA: Diagnosis not present

## 2014-07-27 DIAGNOSIS — M24573 Contracture, unspecified ankle: Secondary | ICD-10-CM | POA: Diagnosis not present

## 2014-07-27 DIAGNOSIS — M7662 Achilles tendinitis, left leg: Secondary | ICD-10-CM | POA: Diagnosis not present

## 2014-07-27 DIAGNOSIS — M24576 Contracture, unspecified foot: Secondary | ICD-10-CM | POA: Diagnosis not present

## 2014-07-27 DIAGNOSIS — M2140 Flat foot [pes planus] (acquired), unspecified foot: Secondary | ICD-10-CM | POA: Diagnosis not present

## 2014-07-27 DIAGNOSIS — M7661 Achilles tendinitis, right leg: Secondary | ICD-10-CM | POA: Diagnosis not present

## 2014-07-29 DIAGNOSIS — K5730 Diverticulosis of large intestine without perforation or abscess without bleeding: Secondary | ICD-10-CM

## 2014-07-29 DIAGNOSIS — N400 Enlarged prostate without lower urinary tract symptoms: Secondary | ICD-10-CM

## 2014-07-29 HISTORY — DX: Diverticulosis of large intestine without perforation or abscess without bleeding: K57.30

## 2014-07-29 HISTORY — DX: Benign prostatic hyperplasia without lower urinary tract symptoms: N40.0

## 2014-08-06 ENCOUNTER — Ambulatory Visit (INDEPENDENT_AMBULATORY_CARE_PROVIDER_SITE_OTHER): Payer: Medicare Other | Admitting: Internal Medicine

## 2014-08-06 ENCOUNTER — Encounter: Payer: Self-pay | Admitting: Internal Medicine

## 2014-08-06 VITALS — BP 98/64 | HR 52 | Temp 98.5°F | Resp 18 | Ht 71.0 in | Wt 220.0 lb

## 2014-08-06 DIAGNOSIS — J449 Chronic obstructive pulmonary disease, unspecified: Secondary | ICD-10-CM

## 2014-08-06 DIAGNOSIS — Z720 Tobacco use: Secondary | ICD-10-CM

## 2014-08-06 DIAGNOSIS — I5022 Chronic systolic (congestive) heart failure: Secondary | ICD-10-CM

## 2014-08-06 DIAGNOSIS — F17210 Nicotine dependence, cigarettes, uncomplicated: Secondary | ICD-10-CM

## 2014-08-06 NOTE — Progress Notes (Signed)
Pre visit review using our clinic review tool, if applicable. No additional management support is needed unless otherwise documented below in the visit note. 

## 2014-08-06 NOTE — Patient Instructions (Signed)
We do not think that you need a chest x-ray today but we do want you to get the CT scan of the lungs in July.   You can crush the paroxetine to see if it works better.   Come back in about 6-8 months for a check up. If you have problems or questions before then call the office.   I would recommend taking the zyrtec daily for the next 1-2 weeks for the allergies and throat.

## 2014-08-06 NOTE — Progress Notes (Signed)
   Subjective:    Patient ID: Steven Guzman, male    DOB: 01-06-1941, 74 y.o.   MRN: [id number]  HPI The patient is coming in to follow up on his breathing. He is still smoking. His pneumonia was not resolved on CXR from 2 months ago and needs CT scan. He is smoking even more due to some financial stress right now. Denies frequent cough but does get SOB with exertion. Okay when walking with cart in grocery store but when walking unaided gets winded. Denies cough. Denies fevers or chills. Denies weight change.   Review of Systems  Constitutional: Negative for fever, activity change, appetite change, fatigue and unexpected weight change.  HENT: Negative.   Eyes: Negative.   Respiratory: Positive for shortness of breath. Negative for cough, chest tightness and wheezing.   Cardiovascular: Negative for chest pain, palpitations and leg swelling.  Gastrointestinal: Negative for abdominal pain, diarrhea, constipation and abdominal distention.  Endocrine: Negative.   Musculoskeletal: Positive for arthralgias.  Skin: Negative.   Neurological: Negative.   Psychiatric/Behavioral: Negative.       Objective:   Physical Exam  Constitutional: He is oriented to person, place, and time. He appears well-developed and well-nourished.  HENT:  Head: Normocephalic and atraumatic.  Eyes: EOM are normal.  Neck: Normal range of motion.  Cardiovascular: Normal rate.   Pulmonary/Chest: Effort normal and breath sounds normal.  Abdominal: Soft. Bowel sounds are normal. He exhibits no distension. There is no tenderness. There is no rebound.  Musculoskeletal: He exhibits no edema.  Neurological: He is alert and oriented to person, place, and time.  Skin: Skin is warm and dry.  Psychiatric: He has a normal mood and affect. His behavior is normal.   Filed Vitals:   08/06/14 1310  BP: 98/64  Pulse: 52  Temp: 98.5 F (36.9 C)  TempSrc: Oral  Resp: 18  Height: 5\' 11"  (1.803 m)  Weight: 220 lb (99.791 kg)    SpO2: 94%      Assessment & Plan:

## 2014-08-06 NOTE — Assessment & Plan Note (Signed)
Getting CT lung this summer and reminded him that this was important to remember. He is still smoking which limits what treatment can do for him. Talked to him about quitting but he does not feel able to do that right now.

## 2014-08-06 NOTE — Assessment & Plan Note (Signed)
Is smoking even more lately due to increased stress with financial troubles (also his wife's health is not god right now). Talked to him about the risk of further damage, cancer. He knows and does not feel able to try to quit right now.

## 2014-08-06 NOTE — Assessment & Plan Note (Signed)
Has not missed any doses of his coreg, ace-i (benazepril), statin, aspirin. No shocks from ICD. Is taking lasix and spironolactone. No weight change to indicate exacerbation today. No adjustment to his regimen.

## 2014-08-07 ENCOUNTER — Encounter: Payer: Self-pay | Admitting: Internal Medicine

## 2014-08-09 ENCOUNTER — Encounter (HOSPITAL_COMMUNITY): Payer: Self-pay

## 2014-08-09 ENCOUNTER — Emergency Department (HOSPITAL_COMMUNITY)
Admission: EM | Admit: 2014-08-09 | Discharge: 2014-08-09 | Disposition: A | Discharge status: Home / Self Care | Payer: Medicare Other | Attending: Emergency Medicine | Admitting: Emergency Medicine

## 2014-08-09 ENCOUNTER — Emergency Department (HOSPITAL_COMMUNITY): Payer: Medicare Other

## 2014-08-09 DIAGNOSIS — E785 Hyperlipidemia, unspecified: Secondary | ICD-10-CM | POA: Diagnosis not present

## 2014-08-09 DIAGNOSIS — Z720 Tobacco use: Secondary | ICD-10-CM | POA: Insufficient documentation

## 2014-08-09 DIAGNOSIS — N281 Cyst of kidney, acquired: Secondary | ICD-10-CM | POA: Diagnosis not present

## 2014-08-09 DIAGNOSIS — J449 Chronic obstructive pulmonary disease, unspecified: Secondary | ICD-10-CM | POA: Diagnosis not present

## 2014-08-09 DIAGNOSIS — I10 Essential (primary) hypertension: Secondary | ICD-10-CM | POA: Insufficient documentation

## 2014-08-09 DIAGNOSIS — F329 Major depressive disorder, single episode, unspecified: Secondary | ICD-10-CM | POA: Insufficient documentation

## 2014-08-09 DIAGNOSIS — K219 Gastro-esophageal reflux disease without esophagitis: Secondary | ICD-10-CM | POA: Insufficient documentation

## 2014-08-09 DIAGNOSIS — R109 Unspecified abdominal pain: Secondary | ICD-10-CM

## 2014-08-09 DIAGNOSIS — Z79899 Other long term (current) drug therapy: Secondary | ICD-10-CM | POA: Insufficient documentation

## 2014-08-09 DIAGNOSIS — I2510 Atherosclerotic heart disease of native coronary artery without angina pectoris: Secondary | ICD-10-CM | POA: Diagnosis not present

## 2014-08-09 DIAGNOSIS — Z9889 Other specified postprocedural states: Secondary | ICD-10-CM | POA: Insufficient documentation

## 2014-08-09 DIAGNOSIS — R1011 Right upper quadrant pain: Secondary | ICD-10-CM | POA: Insufficient documentation

## 2014-08-09 DIAGNOSIS — Z7982 Long term (current) use of aspirin: Secondary | ICD-10-CM | POA: Diagnosis not present

## 2014-08-09 DIAGNOSIS — K449 Diaphragmatic hernia without obstruction or gangrene: Secondary | ICD-10-CM | POA: Diagnosis not present

## 2014-08-09 DIAGNOSIS — K5730 Diverticulosis of large intestine without perforation or abscess without bleeding: Secondary | ICD-10-CM | POA: Diagnosis not present

## 2014-08-09 DIAGNOSIS — D3502 Benign neoplasm of left adrenal gland: Secondary | ICD-10-CM | POA: Diagnosis not present

## 2014-08-09 LAB — BASIC METABOLIC PANEL
Anion gap: 5 (ref 5–15)
BUN: 13 mg/dL (ref 6–20)
CALCIUM: 8.7 mg/dL — AB (ref 8.9–10.3)
CHLORIDE: 107 mmol/L (ref 101–111)
CO2: 26 mmol/L (ref 22–32)
Creatinine, Ser: 1.19 mg/dL (ref 0.61–1.24)
GFR calc Af Amer: >60 mL/min (ref >60)
GFR, EST NON AFRICAN AMERICAN: 58 mL/min — AB (ref >60)
GLUCOSE: 133 mg/dL — AB (ref 65–99)
POTASSIUM: 3.8 mmol/L (ref 3.5–5.1)
Sodium: 138 mmol/L (ref 135–145)

## 2014-08-09 LAB — HEPATIC FUNCTION PANEL
ALT: 28 U/L (ref 17–63)
AST: 25 U/L (ref 15–41)
Albumin: 3.8 g/dL (ref 3.5–5.0)
Alkaline Phosphatase: 30 U/L — ABNORMAL LOW (ref 38–126)
BILIRUBIN DIRECT: 0.3 mg/dL (ref 0.1–0.5)
Indirect Bilirubin: 0.7 mg/dL (ref 0.3–0.9)
Total Bilirubin: 1 mg/dL (ref 0.3–1.2)
Total Protein: 6.5 g/dL (ref 6.5–8.1)

## 2014-08-09 LAB — URINALYSIS, ROUTINE W REFLEX MICROSCOPIC
Glucose, UA: NEGATIVE mg/dL
HGB URINE DIPSTICK: NEGATIVE
Ketones, ur: NEGATIVE mg/dL
Leukocytes, UA: NEGATIVE
Nitrite: NEGATIVE
Protein, ur: NEGATIVE mg/dL
SPECIFIC GRAVITY, URINE: 1.015 (ref 1.005–1.030)
Urobilinogen, UA: 1 mg/dL (ref 0.0–1.0)
pH: 5 (ref 5.0–8.0)

## 2014-08-09 LAB — CBC
HCT: 41.4 % (ref 39.0–52.0)
Hemoglobin: 13.5 g/dL (ref 13.0–17.0)
MCH: 31.1 pg (ref 26.0–34.0)
MCHC: 32.6 g/dL (ref 30.0–36.0)
MCV: 95.4 fL (ref 78.0–100.0)
Platelets: 195 10*3/uL (ref 150–400)
RBC: 4.34 MIL/uL (ref 4.22–5.81)
RDW: 14 % (ref 11.5–15.5)
WBC: 8.9 10*3/uL (ref 4.0–10.5)

## 2014-08-09 MED ORDER — POLYETHYLENE GLYCOL 3350 17 G PO PACK: 17.0000 g | PACK | Freq: Every day | ORAL | Status: DC

## 2014-08-09 NOTE — ED Provider Notes (Signed)
CSN: [id number]     Arrival date & time 08/09/14  1715 History   First MD Initiated Contact with Patient 08/09/14 1752     Chief Complaint  Patient presents with  . Flank Pain     (Consider location/radiation/quality/duration/timing/severity/associated sxs/prior Treatment) HPI  Pt presenting with c/o pain in right flank and right upper abdomen.  He states this feels similar to his prior renal stones bt this has been years ago.  No fever/chills.  Pain began approx 4pm, currently patient has no pain.  He did feel nauseated, but no vomiting.  No dysuria.  No blood in urine.  He states he was evaluated for gallstones approx 1 year ago and the ultrasound was normal.  No change in bowel movements.  There are no other associated systemic symptoms, there are no other alleviating or modifying factors.   Past Medical History  Diagnosis Date  . Hypertension   . Hyperlipidemia   . COPD (chronic obstructive pulmonary disease)     a. followed by pulmonary, COPD GOLD stage II  . GERD (gastroesophageal reflux disease)   . Diverticulitis   . CAD (coronary artery disease)     a. s/p MI in 1994 with PCI to LAD at that time b. cath 10/2012 demonstrated EF 30%, inferior akinesis with mild hypokinesis of all walls, patent LAD and RCA stents; ostial PDA with 80-90% obstruction with medical therapy recommended   . Depression   . Aneurysm     abdominal <4  . Tobacco abuse    Past Surgical History  Procedure Laterality Date  . Hernia repair    . Foot surgery      lt  . Hemorrhoid banding    . Retinal detachment surgery      right  . Angioplasty    . Cardiac catheterization    . Nose surgery      skin cancer-mohs-skin graft  . Colonoscopy    . Tenolysis Right 12/21/2013    Procedure: TENOLYSIS FLEXOR CARPI RADIALIS ,DEBRIDEMENT RIGHT JOINT WRIST,DEBRIDEMENT SCAPHOTRAPEZIAL TRAPEZOID, REPAIR OF EXTENSOR HOOD;  Surgeon: Daryll Brod, MD;  Location: [facility];  Service: Orthopedics;   Laterality: Right;  . Left heart catheterization with coronary angiogram N/A 11/25/2012    demonstrated EF 30%, inferior akinesis with mild hypokinesis of all walls, patent LAD and RCA stents; ostial PDA with 80-90% obstruction with medical therapy recommended   Family History  Problem Relation Age of Onset  . Heart attack Brother   . CAD Father   . CAD Mother    History  Substance Use Topics  . Smoking status: Current Every Day Smoker -- 0.50 packs/day for 55 years    Types: Cigarettes  . Smokeless tobacco: Never Used     Comment: attending the "stop smoking" classes through [facility]  . Alcohol Use: 0.0 oz/week    0 Standard drinks or equivalent per week     Comment: occasionally     Review of Systems  ROS reviewed and all otherwise negative except for mentioned in HPI    Allergies  Sulfa antibiotics  Home Medications   Prior to Admission medications   Medication Sig Start Date End Date Taking? Authorizing Provider  amiodarone (PACERONE) 200 MG tablet Take 1 tablet (200 mg total) by mouth daily. 07/18/14  Yes [name], MD  aspirin 81 MG tablet Take 81 mg by mouth daily.   Yes Historical Provider, MD  atorvastatin (LIPITOR) 80 MG tablet Take 1 tablet (80 mg total) by mouth  daily. 04/12/14  Yes Belva Crome, MD  benazepril (LOTENSIN) 10 MG tablet Take 1 tablet (10 mg total) by mouth daily. 07/20/14  Yes Belva Crome, MD  budesonide-formoterol [facility]) 160-4.5 MCG/ACT inhaler Take 2 puffs first thing in am and then another 2 puffs about 12 hours later. Patient taking differently: Inhale 2 puffs into the lungs first thing in the am and then inhale another 2 puffs into the lungs about 12 hours later. 02/06/14  Yes Tanda Rockers, MD  busPIRone (BUSPAR) 15 MG tablet Take one half in the morning and one half in the evening. 06/04/14  Yes Historical Provider, MD  carvedilol (COREG) 12.5 MG tablet Take 1 tablet (12.5 mg total) by mouth 2 (two) times daily with a meal. 03/19/14   Yes Belva Crome, MD  Choline Fenofibrate (TRILIPIX) 135 MG capsule Take 1 capsule (135 mg total) by mouth daily. 12/25/13  Yes Belva Crome, MD  COMBIVENT RESPIMAT 20-100 MCG/ACT AERS respimat Inhale 2 puffs into the lungs every 6 (six) hours as needed. Shortness of breath or wheezing 01/28/14  Yes Historical Provider, MD  furosemide (LASIX) 40 MG tablet Take 0.5 tablets by mouth daily.   Yes Historical Provider, MD  gabapentin (NEURONTIN) 300 MG capsule Take 300 mg by mouth 2 (two) times daily.   Yes Historical Provider, MD  nitroGLYCERIN (NITROSTAT) 0.4 MG SL tablet Place 1 tablet (0.4 mg total) under the tongue every 5 (five) minutes as needed for chest pain. 11/25/12  Yes Belva Crome, MD  omeprazole (PRILOSEC) 20 MG capsule Take 20 mg by mouth 2 (two) times daily.   Yes Historical Provider, MD  PARoxetine (PAXIL) 40 MG tablet take 1 tablet by mouth every morning 07/19/14  Yes Olga Millers, MD  spironolactone (ALDACTONE) 25 MG tablet Take 0.5 tablets (12.5 mg total) by mouth daily. 02/28/14  Yes Belva Crome, MD  tamsulosin (FLOMAX) 0.4 MG CAPS capsule Take 1 capsule (0.4 mg total) by mouth daily after supper. 05/21/14  Yes Olga Millers, MD  tiotropium (SPIRIVA) 18 MCG inhalation capsule Place 1 capsule (18 mcg total) into inhaler and inhale daily. 06/25/14  Yes Juanito Doom, MD  busPIRone (BUSPAR) 5 MG tablet Take 2.5 mg by mouth 2 (two) times daily.    Historical Provider, MD  gabapentin (NEURONTIN) 100 MG capsule Take 300 mg by mouth 2 (two) times daily.  02/08/14   Historical Provider, MD  polyethylene glycol (MIRALAX) packet Take 17 g by mouth daily. 08/09/14   Alfonzo Beers, MD  vitamin B-12 (CYANOCOBALAMIN) 1000 MCG tablet Take 1,000 mcg by mouth daily.    Historical Provider, MD   BP 107/68 mmHg  Pulse 60  Temp(Src) 97.6 F (36.4 C) (Oral)  Resp 18  SpO2 96%  Vitals reviewed Physical Exam  Physical Examination: General appearance - alert, well appearing, and in no  distress Mental status - alert, oriented to person, place, and time Eyes - no conjunctival injection, no scleral icterus Mouth - mucous membranes moist, pharynx normal without lesions Chest - clear to auscultation, no wheezes, rales or rhonchi, symmetric air entry Heart - normal rate, regular rhythm, normal S1, S2, no murmurs, rubs, clicks or gallops Abdomen - soft, nontender, nondistended, no masses or organomegaly, nabs Extremities - peripheral pulses normal, no pedal edema, no clubbing or cyanosis Skin - normal coloration and turgor, no rashes  ED Course  Procedures (including critical care time) Labs Review Labs Reviewed  URINALYSIS, ROUTINE W REFLEX MICROSCOPIC -  Abnormal; Notable for the following:    Color, Urine AMBER (*)    Bilirubin Urine SMALL (*)    All other components within normal limits  BASIC METABOLIC PANEL - Abnormal; Notable for the following:    Glucose, Bld 133 (*)    Calcium 8.7 (*)    GFR calc non Af Amer 58 (*)    All other components within normal limits  HEPATIC FUNCTION PANEL - Abnormal; Notable for the following:    Alkaline Phosphatase 30 (*)    All other components within normal limits  CBC    Imaging Review Ct Abdomen Pelvis Wo Contrast  08/09/2014   CLINICAL DATA:  Right flank pain starting tonight  EXAM: CT ABDOMEN AND PELVIS WITHOUT CONTRAST  TECHNIQUE: Multidetector CT imaging of the abdomen and pelvis was performed following the standard protocol without IV contrast.  COMPARISON:  09/25/2005  FINDINGS: Unenhanced liver shows no biliary ductal dilatation. A tiny cyst is noted in right hepatic dome anteriorly measures 6 mm. No calcified gallstones are noted within gallbladder. The pancreas, spleen and adrenal glands are unremarkable. There is a cyst in midpole posterior aspect of the right kidney measures 2.3 cm. No nephrolithiasis. No hydronephrosis or hydroureter. Bilateral no calcified ureteral calculi are noted. Atherosclerotic calcifications of  abdominal aorta and iliac arteries. There is aneurysmal dilatation of distal abdominal aorta measures 3.1 x 3.1 cm in diameter.  Small hiatal hernia measures 4.4 cm. Unenhanced pancreas spleen is unremarkable. Stable mild thickening of right adrenal gland. Stable left adrenal at the adenoma.  Abundant stool noted in right colon and cecum. No pericecal inflammation. Normal retrocecal appendix noted axial image 65.  No small bowel obstruction.  No ascites or free air.  No adenopathy.  Multiple sigmoid colon diverticula are noted. There is no evidence of acute diverticulitis. Bilateral distal ureter is unremarkable. Mild enlarged prostate gland with indentation of urinary bladder base. Prostate gland measures 5.6 by 3.7 cm. There is thickening of urinary bladder wall. Cystitis cannot be excluded. Clinical correlation is necessary. No distal colonic obstruction.  The lung bases are unremarkable. Sagittal images of the spine shows about 9 mm anterolisthesis L5 on S1 vertebral body. There is significant disc space flattening with vacuum disc phenomenon at L5-S1 level. Bilateral pars defect at L5 level.  IMPRESSION: 1. No nephrolithiasis No hydronephrosis or hydroureter. No calcified ureteral calculi. 2. There is aneurysmal dilatation of distal abdominal aorta measures 3.1 x 3.1 cm in diameter. 3. There is a cyst in midpole posterior aspect of right kidney measures 2.2 cm. 4. Moderate stool noted in right colon and cecum. No pericecal inflammation. Normal appendix. 5. Multiple sigmoid colon diverticula without evidence of acute diverticulitis. 6. There is thickening of urinary bladder wall. Cystitis cannot be excluded. Mild enlarged prostate gland with indentation of urinary bladder base. 7. Small hiatal hernia. 8. Stable left adrenal adenoma. 9. Bilateral pars defect at L5 level. There is about 9 mm anterolisthesis L5 on S1 vertebral body.   Electronically Signed   By: Lahoma Crocker M.D.   On: 08/09/2014 19:12     EKG  Interpretation   Date/Time:  Thursday Aug 09 2014 17:43:31 EDT Ventricular Rate:  66 PR Interval:  223 QRS Duration: 103 QT Interval:  540 QTC Calculation: 566 R Axis:   91 Text Interpretation:  Atrial-paced complexes Ventricular premature complex  Prolonged PR interval Right axis deviation Abnormal inferior Q waves  Borderline repolarization abnormality Prolonged QT interval Baseline  wander in lead(s) I III aVL  V2 V6 artifact noted Confirmed by Christy Gentles   MD, [place] ([id number]) on 08/09/2014 5:46:34 PM      MDM   Final diagnoses:  Abdominal pain, unspecified abdominal location    Pt presenting with c/o right sided abdominal pain that has resolved at the time of my evaluation.  Workup shows reassuring labs and urine.  Abdominal CT scan shows no renal stone, no gallbladder abnormalities, no SBO.  Pt does have some increased stool burden in right side of colon- will start on miralax.  Pain has not recurred in the ED, abdominal exam is benign.  Discharged with strict return precautions.  Pt agreeable with plan.   Alfonzo Beers, MD 08/09/14 2042

## 2014-08-09 NOTE — ED Notes (Signed)
Nurse starting an IV

## 2014-08-09 NOTE — ED Notes (Signed)
Patient is alert and oriented x3.  He was given DC instructions and follow up visit instructions.  Patient gave verbal understanding.  He was DC ambulatory under his own power to home.  V/S stable.  He was not showing any signs of distress on DC 

## 2014-08-09 NOTE — ED Notes (Addendum)
Per pt, approx 45 min ago he started having rt flank pain radiating to back.  Nausea with no vomiting.  Pain is constant.  Pt has hx of kidney stones.  Years and years ago.  No change in urination or bowels.

## 2014-08-09 NOTE — Discharge Instructions (Signed)
Return to the ED with any concerns including vomiting and not able to keep down liquids, fever/chills, fainting, chest pain, difficulty breathing, decreased level of alertness/lethargy, or any other alarming symptoms

## 2014-08-15 ENCOUNTER — Ambulatory Visit (INDEPENDENT_AMBULATORY_CARE_PROVIDER_SITE_OTHER)
Admission: RE | Admit: 2014-08-15 | Discharge: 2014-08-15 | Disposition: A | Discharge status: Home / Self Care | Payer: Medicare Other | Source: Ambulatory Visit | Attending: Internal Medicine | Admitting: Internal Medicine

## 2014-08-15 ENCOUNTER — Encounter: Payer: Self-pay | Admitting: Internal Medicine

## 2014-08-15 ENCOUNTER — Ambulatory Visit (INDEPENDENT_AMBULATORY_CARE_PROVIDER_SITE_OTHER): Payer: Medicare Other | Admitting: Internal Medicine

## 2014-08-15 ENCOUNTER — Other Ambulatory Visit (INDEPENDENT_AMBULATORY_CARE_PROVIDER_SITE_OTHER): Payer: Medicare Other

## 2014-08-15 VITALS — BP 98/58 | HR 58 | Temp 98.1°F | Wt 219.2 lb

## 2014-08-15 DIAGNOSIS — R6883 Chills (without fever): Secondary | ICD-10-CM | POA: Diagnosis not present

## 2014-08-15 DIAGNOSIS — R0989 Other specified symptoms and signs involving the circulatory and respiratory systems: Secondary | ICD-10-CM

## 2014-08-15 DIAGNOSIS — R0689 Other abnormalities of breathing: Secondary | ICD-10-CM

## 2014-08-15 DIAGNOSIS — R61 Generalized hyperhidrosis: Secondary | ICD-10-CM | POA: Diagnosis not present

## 2014-08-15 DIAGNOSIS — J439 Emphysema, unspecified: Secondary | ICD-10-CM | POA: Diagnosis not present

## 2014-08-15 LAB — CBC WITH DIFFERENTIAL/PLATELET
BASOS PCT: 0.2 % (ref 0.0–3.0)
Basophils Absolute: 0 10*3/uL (ref 0.0–0.1)
EOS ABS: 0.1 10*3/uL (ref 0.0–0.7)
Eosinophils Relative: 1.7 % (ref 0.0–5.0)
HCT: 41.3 % (ref 39.0–52.0)
HEMOGLOBIN: 13.8 g/dL (ref 13.0–17.0)
LYMPHS PCT: 16.1 % (ref 12.0–46.0)
Lymphs Abs: 1.3 10*3/uL (ref 0.7–4.0)
MCHC: 33.3 g/dL (ref 30.0–36.0)
MCV: 92.3 fl (ref 78.0–100.0)
MONO ABS: 1.2 10*3/uL — AB (ref 0.1–1.0)
Monocytes Relative: 14.2 % — ABNORMAL HIGH (ref 3.0–12.0)
NEUTROS ABS: 5.6 10*3/uL (ref 1.4–7.7)
Neutrophils Relative %: 67.8 % (ref 43.0–77.0)
Platelets: 191 10*3/uL (ref 150.0–400.0)
RBC: 4.47 Mil/uL (ref 4.22–5.81)
RDW: 14.7 % (ref 11.5–15.5)
WBC: 8.3 10*3/uL (ref 4.0–10.5)

## 2014-08-15 LAB — URINALYSIS
Hgb urine dipstick: NEGATIVE
Ketones, ur: NEGATIVE
Leukocytes, UA: NEGATIVE
Nitrite: NEGATIVE
PH: 5.5 (ref 5.0–8.0)
SPECIFIC GRAVITY, URINE: 1.02 (ref 1.000–1.030)
Total Protein, Urine: NEGATIVE
URINE GLUCOSE: NEGATIVE
UROBILINOGEN UA: 1 (ref 0.0–1.0)

## 2014-08-15 NOTE — Progress Notes (Signed)
   Subjective:    Patient ID: Steven Guzman, male    DOB: Feb 25, 1941, 74 y.o.   MRN: [id number]  HPI He was seen in the emergency room 08/09/14 for right flank and right upper quadrant pain. By the time he was seen by the MD the pain had resolved. CT scan showed no abnormality of the gallbladder. He had been evaluated with ultrasound of the gallbladder previously which was also negative. He has a history of renal calculi but he states the symptoms did not mimic that. The CAT scan did show increased stool burden in the right colon for which MiraLAX was recommended.  He's not had recurrence of the right upper quadrant pain; but as of 5/15 he experienced sweating. On 5/16 he had frank chills. Tylenol did help the symptoms. He had no localizing signs or symptoms for the chills and sweating. He did have loose stool on one occasion.  In January of this year he was treated for left upper lobe pneumonia.  He does smoke 1 pack per day   Review of Systems  Frontal headache, facial pain , nasal purulence, dental pain, sore throat , otic pain or otic discharge denied.  Cough, sputum production, hemoptysis, or pleuritic pain denied.  No diarrhea present.Cola colored urine or clay colored stools denied.  Dysuria, pyuria, or hematuria not present.  No new rashes, pustules, vesicles.  No redness or swelling of joints.  No significant travel, pets, or tick exposures.     Objective:   Physical Exam  Pertinent or positive findings include:  Heart sounds are somewhat distant.  Breath sounds are decreased posteriorly. There is asymmetry of the rhonchi anteriorly. These are more pronounced in the right parasternal area.  Umbilical hernia present. He has some bruising of the upper extremities with hyperpigmentation changes.  Pedal pulses are slightly decreased  General appearance :adequately nourished; in no distress. Eyes: No conjunctival inflammation or scleral icterus is present. Oral exam:   Lips and gums are healthy appearing.There is no oropharyngeal erythema or exudate noted. Dental hygiene is good. Heart:  Normal rate and regular rhythm. S1 and S2 normal without gallop, murmur, click, rub or other extra sounds   Abdomen: bowel sounds normal, soft and non-tender without masses, organomegaly or hernias noted.  No guarding or rebound. No flank tenderness to percussion. Vascular : all pulses equal ; no bruits present. Skin:Warm & dry.  Intact without suspicious lesions or rashes ; no tenting or jaundice  Lymphatic: No lymphadenopathy is noted about the head, neck, axilla Neuro: Strength, tone & DTRs normal.        Assessment & Plan:  #1 chills and sweats without localizing signs  #2 abnormal breath sounds anteriorly  #3 right upper quadrant abdominal pain, resolved  Plan: See orders and recommendations

## 2014-08-15 NOTE — Patient Instructions (Signed)
  Your next office appointment will be determined based upon review of your pending labs  and  xrays  Those instructions will be transmitted to you by My Chart  Critical results will be called.   Followup as needed for any active or acute issue. Please report any significant change in your symptoms. 

## 2014-08-15 NOTE — Progress Notes (Signed)
Pre visit review using our clinic review tool, if applicable. No additional management support is needed unless otherwise documented below in the visit note. 

## 2014-08-17 ENCOUNTER — Telehealth: Payer: Self-pay | Admitting: Internal Medicine

## 2014-08-17 ENCOUNTER — Other Ambulatory Visit: Payer: Self-pay | Admitting: Internal Medicine

## 2014-08-17 DIAGNOSIS — R822 Biliuria: Secondary | ICD-10-CM

## 2014-08-17 DIAGNOSIS — R1011 Right upper quadrant pain: Secondary | ICD-10-CM

## 2014-08-17 LAB — URINE CULTURE
COLONY COUNT: NO GROWTH
Organism ID, Bacteria: NO GROWTH

## 2014-08-17 NOTE — Telephone Encounter (Signed)
Patient states he seen Dr. Linna Darner this week bc of pain on right side and was told to call back if he was having any issues. Patient states he did have pain yesterday and he has had three episodes of vomiting, yesterday afternoon and this morning.  He would like to know what else he needs to do.

## 2014-08-17 NOTE — Telephone Encounter (Signed)
Please advise, thanks.

## 2014-08-17 NOTE — Telephone Encounter (Signed)
Additional lab studies recommended. Orders entered. Depending on results; GI referral indicated.  Stay on clear liquids for 48-72 hours or until bowels are normal.This would include  jello, sherbert (NOT ice cream), Lipton's chicken noodle soup(NOT cream based soups),Gatorade Lite, flat Ginger ale (without High Fructose Corn Syrup),dry toast or crackers, baked potato.No milk , dairy .To ER for increasing pain, fever or rectal bleeding

## 2014-08-17 NOTE — Telephone Encounter (Signed)
Called patient and explained dr hoppers note, patient repeated back for understanding

## 2014-08-21 ENCOUNTER — Encounter: Payer: Self-pay | Admitting: *Deleted

## 2014-08-22 ENCOUNTER — Other Ambulatory Visit: Payer: Self-pay | Admitting: *Deleted

## 2014-08-22 MED ORDER — BENAZEPRIL HCL 10 MG PO TABS: 10.0000 mg | ORAL_TABLET | Freq: Every day | ORAL | Status: DC

## 2014-08-23 ENCOUNTER — Ambulatory Visit (INDEPENDENT_AMBULATORY_CARE_PROVIDER_SITE_OTHER): Payer: Medicare Other | Admitting: Internal Medicine

## 2014-08-23 ENCOUNTER — Encounter: Payer: Self-pay | Admitting: Internal Medicine

## 2014-08-23 VITALS — BP 114/82 | HR 64 | Ht 71.0 in | Wt 215.0 lb

## 2014-08-23 DIAGNOSIS — I472 Ventricular tachycardia, unspecified: Secondary | ICD-10-CM

## 2014-08-23 DIAGNOSIS — I5022 Chronic systolic (congestive) heart failure: Secondary | ICD-10-CM

## 2014-08-23 DIAGNOSIS — Z95810 Presence of automatic (implantable) cardiac defibrillator: Secondary | ICD-10-CM | POA: Diagnosis not present

## 2014-08-23 LAB — CUP PACEART INCLINIC DEVICE CHECK
Date Time Interrogation Session: 20160526040000
HighPow Impedance: 48 Ohm
HighPow Impedance: 58 Ohm
Lead Channel Impedance Value: 480 Ohm
Lead Channel Impedance Value: 610 Ohm
Lead Channel Pacing Threshold Amplitude: 0.9 V
Lead Channel Pacing Threshold Amplitude: 1 V
Lead Channel Pacing Threshold Pulse Width: 0.4 ms
Lead Channel Pacing Threshold Pulse Width: 0.4 ms
Lead Channel Setting Pacing Pulse Width: 0.4 ms
Lead Channel Setting Sensing Sensitivity: 0.6 mV
MDC IDC MSMT LEADCHNL RA SENSING INTR AMPL: 3.7 mV
MDC IDC MSMT LEADCHNL RV SENSING INTR AMPL: 10.5 mV
MDC IDC SET LEADCHNL RA PACING AMPLITUDE: 2 V
MDC IDC SET LEADCHNL RV PACING AMPLITUDE: 2 V
Pulse Gen Serial Number: 164892
Zone Setting Detection Interval: 286 ms
Zone Setting Detection Interval: 375 ms

## 2014-08-23 NOTE — Patient Instructions (Signed)
Medication Instructions:  Your physician recommends that you continue on your current medications as directed. Please refer to the Current Medication list given to you today.   Labwork: None ordered  Testing/Procedures: None ordered  Follow-Up: Your physician recommends that you schedule a follow-up appointment in: 3 months in the device clinic and Dec with Dr Lovena Le   Any Other Special Instructions Will Be Listed Below (If Applicable).

## 2014-08-23 NOTE — Assessment & Plan Note (Signed)
His [facility] device is working normally. Will continue his current meds.

## 2014-08-23 NOTE — Progress Notes (Signed)
HPI Mr. Maffei returns today for ongoing evaluation and management of his ICD and ventricular tachycardia. He is a very pleasant 74 year old man with known coronary disease, status post MI, who developed ventricular tachycardia and underwent ICD implantation several years ago. Because of recurrent ventricular tachycardia, he was placed on amiodarone therapy. Overall he feels well, and denies chest pain or shortness of breath. He has been hospitalized with recurrent VT, and has had no episodes since discharge.  No other complaints. He has become more compliant with medications since his ICD shock off of meds back in December. Allergies  Allergen Reactions  . Sulfa Antibiotics     hives     Current Outpatient Prescriptions  Medication Sig Dispense Refill  . amiodarone (PACERONE) 200 MG tablet Take 1 tablet (200 mg total) by mouth daily. 90 tablet 0  . aspirin 81 MG tablet Take 81 mg by mouth daily.    [name] atorvastatin (LIPITOR) 80 MG tablet Take 1 tablet (80 mg total) by mouth daily. 30 tablet 6  . benazepril (LOTENSIN) 10 MG tablet Take 1 tablet (10 mg total) by mouth daily. 30 tablet 1  . budesonide-formoterol (SYMBICORT) 160-4.5 MCG/ACT inhaler Take 2 puffs first thing in am and then another 2 puffs about 12 hours later. (Patient taking differently: Inhale 2 puffs into the lungs first thing in the am and then inhale another 2 puffs into the lungs about 12 hours later.) 1 Inhaler 11  . busPIRone (BUSPAR) 15 MG tablet Take one half tablet in the morning and one half tablet in the evening.  0  . carvedilol (COREG) 12.5 MG tablet Take 1 tablet (12.5 mg total) by mouth 2 (two) times daily with a meal. 60 tablet 6  . cetirizine (ZYRTEC) 10 MG tablet Take 10 mg by mouth daily as needed for allergies.    . Choline Fenofibrate (TRILIPIX) 135 MG capsule Take 1 capsule (135 mg total) by mouth daily. 30 capsule 8  . COMBIVENT RESPIMAT 20-100 MCG/ACT AERS respimat Inhale 2 puffs into the lungs every  6 (six) hours as needed. Shortness of breath or wheezing    . furosemide (LASIX) 40 MG tablet Take 0.5 tablets by mouth daily.    [name] gabapentin (NEURONTIN) 100 MG capsule Take 300 mg by mouth 2 (two) times daily.   4  . nitroGLYCERIN (NITROSTAT) 0.4 MG SL tablet Place 1 tablet (0.4 mg total) under the tongue every 5 (five) minutes as needed for chest pain. 25 tablet 12  . omeprazole (PRILOSEC) 20 MG capsule Take 20 mg by mouth 2 (two) times daily.    [name] PARoxetine (PAXIL) 40 MG tablet take 1 tablet by mouth every morning 30 tablet 11  . polyethylene glycol (MIRALAX) packet Take 17 g by mouth daily. 14 each 0  . spironolactone (ALDACTONE) 25 MG tablet Take 0.5 tablets (12.5 mg total) by mouth daily.    . tamsulosin (FLOMAX) 0.4 MG CAPS capsule Take 1 capsule (0.4 mg total) by mouth daily after supper. 30 capsule 3  . tiotropium (SPIRIVA) 18 MCG inhalation capsule Place 1 capsule (18 mcg total) into inhaler and inhale daily. 30 capsule 3   No current facility-administered medications for this visit.     Past Medical History  Diagnosis Date  . Hypertension   . Hyperlipidemia   . COPD (chronic obstructive pulmonary disease)     a. followed by pulmonary, COPD GOLD stage II  . GERD (gastroesophageal reflux disease)   . Diverticulitis   .  CAD (coronary artery disease)     a. s/p MI in 1994 with PCI to LAD at that time b. cath 10/2012 demonstrated EF 30%, inferior akinesis with mild hypokinesis of all walls, patent LAD and RCA stents; ostial PDA with 80-90% obstruction with medical therapy recommended   . Depression   . Aneurysm     abdominal <4  . Tobacco abuse     ROS:   All systems reviewed and negative except as noted in the HPI.   Past Surgical History  Procedure Laterality Date  . Hernia repair    . Foot surgery      lt  . Hemorrhoid banding    . Retinal detachment surgery      right  . Angioplasty    . Cardiac catheterization    . Nose surgery      skin cancer-mohs-skin  graft  . Colonoscopy    . Tenolysis Right 12/21/2013    Procedure: TENOLYSIS FLEXOR CARPI RADIALIS ,DEBRIDEMENT RIGHT JOINT WRIST,DEBRIDEMENT SCAPHOTRAPEZIAL TRAPEZOID, REPAIR OF EXTENSOR HOOD;  Surgeon: Daryll Brod, MD;  Location: [facility];  Service: Orthopedics;  Laterality: Right;  . Left heart catheterization with coronary angiogram N/A 11/25/2012    demonstrated EF 30%, inferior akinesis with mild hypokinesis of all walls, patent LAD and RCA stents; ostial PDA with 80-90% obstruction with medical therapy recommended     Family History  Problem Relation Age of Onset  . Heart attack Brother   . CAD Father   . CAD Mother      History   Social History  . Marital Status: Married    Spouse Name: N/A  . Number of Children: N/A  . Years of Education: N/A   Occupational History  . Not on file.   Social History Main Topics  . Smoking status: Current Every Day Smoker -- 0.50 packs/day for 55 years    Types: Cigarettes  . Smokeless tobacco: Never Used     Comment: attending the "stop smoking" classes through [facility]  . Alcohol Use: 0.0 oz/week    0 Standard drinks or equivalent per week     Comment: occasionally   . Drug Use: No  . Sexual Activity: Not Currently   Other Topics Concern  . Not on file   Social History Narrative     BP 114/82 mmHg  Pulse 64  Ht 5\' 11"  (1.803 m)  Wt 215 lb (97.523 kg)  BMI 30.00 kg/m2  Physical Exam:  Well appearing 74 year old man, NAD HEENT: Unremarkable Neck:  No JVD, no thyromegally Back:  No CVA tenderness Lungs:  Clear with no wheezes, rales, or rhonchi. HEART:  Regular rate rhythm, no murmurs, no rubs, no clicks Abd:  soft, positive bowel sounds, no organomegally, no rebound, no guarding Ext:  2 plus pulses, no edema, no cyanosis, no clubbing Skin:  No rashes no nodules Neuro:  CN II through XII intact, motor grossly intact  DEVICE  Normal device function.  See PaceArt for details.   Assess/Plan:

## 2014-08-23 NOTE — Assessment & Plan Note (Signed)
He will continue amiodarone. No change in meds.

## 2014-08-23 NOTE — Assessment & Plan Note (Signed)
His symptoms are class 2 and multifactorial. He will continue his current dose of diuretic and maintain a low sodium diet.

## 2014-09-07 ENCOUNTER — Observation Stay (HOSPITAL_COMMUNITY)
Admission: EM | Admit: 2014-09-07 | Discharge: 2014-09-09 | Disposition: A | Discharge status: Home / Self Care | Payer: Medicare Other | Attending: Internal Medicine | Admitting: Internal Medicine

## 2014-09-07 ENCOUNTER — Encounter (HOSPITAL_COMMUNITY): Payer: Self-pay

## 2014-09-07 DIAGNOSIS — I5022 Chronic systolic (congestive) heart failure: Secondary | ICD-10-CM | POA: Diagnosis present

## 2014-09-07 DIAGNOSIS — F329 Major depressive disorder, single episode, unspecified: Secondary | ICD-10-CM | POA: Insufficient documentation

## 2014-09-07 DIAGNOSIS — I252 Old myocardial infarction: Secondary | ICD-10-CM | POA: Diagnosis not present

## 2014-09-07 DIAGNOSIS — E785 Hyperlipidemia, unspecified: Secondary | ICD-10-CM | POA: Diagnosis present

## 2014-09-07 DIAGNOSIS — Z955 Presence of coronary angioplasty implant and graft: Secondary | ICD-10-CM | POA: Insufficient documentation

## 2014-09-07 DIAGNOSIS — F17210 Nicotine dependence, cigarettes, uncomplicated: Secondary | ICD-10-CM | POA: Diagnosis not present

## 2014-09-07 DIAGNOSIS — I4901 Ventricular fibrillation: Secondary | ICD-10-CM | POA: Diagnosis not present

## 2014-09-07 DIAGNOSIS — Z882 Allergy status to sulfonamides status: Secondary | ICD-10-CM | POA: Insufficient documentation

## 2014-09-07 DIAGNOSIS — I10 Essential (primary) hypertension: Secondary | ICD-10-CM | POA: Insufficient documentation

## 2014-09-07 DIAGNOSIS — I472 Ventricular tachycardia, unspecified: Secondary | ICD-10-CM

## 2014-09-07 DIAGNOSIS — R079 Chest pain, unspecified: Secondary | ICD-10-CM | POA: Diagnosis present

## 2014-09-07 DIAGNOSIS — Z8249 Family history of ischemic heart disease and other diseases of the circulatory system: Secondary | ICD-10-CM | POA: Diagnosis not present

## 2014-09-07 DIAGNOSIS — Z95810 Presence of automatic (implantable) cardiac defibrillator: Secondary | ICD-10-CM | POA: Insufficient documentation

## 2014-09-07 DIAGNOSIS — R069 Unspecified abnormalities of breathing: Secondary | ICD-10-CM | POA: Diagnosis not present

## 2014-09-07 DIAGNOSIS — I255 Ischemic cardiomyopathy: Secondary | ICD-10-CM | POA: Diagnosis not present

## 2014-09-07 DIAGNOSIS — J410 Simple chronic bronchitis: Secondary | ICD-10-CM | POA: Diagnosis present

## 2014-09-07 DIAGNOSIS — J449 Chronic obstructive pulmonary disease, unspecified: Secondary | ICD-10-CM | POA: Insufficient documentation

## 2014-09-07 DIAGNOSIS — K219 Gastro-esophageal reflux disease without esophagitis: Secondary | ICD-10-CM | POA: Diagnosis not present

## 2014-09-07 DIAGNOSIS — I2510 Atherosclerotic heart disease of native coronary artery without angina pectoris: Secondary | ICD-10-CM | POA: Diagnosis not present

## 2014-09-07 DIAGNOSIS — Z7982 Long term (current) use of aspirin: Secondary | ICD-10-CM | POA: Insufficient documentation

## 2014-09-07 HISTORY — DX: Ventricular tachycardia: I47.2

## 2014-09-07 HISTORY — DX: Chronic systolic (congestive) heart failure: I50.22

## 2014-09-07 HISTORY — DX: Ventricular tachycardia, unspecified: I47.20

## 2014-09-07 NOTE — ED Notes (Signed)
PER EMS: pt from home, called EMS tonight because he thought his HR was fast so he checked his HR with his home machine and it was 150. He was also experiencing left side chest pain. He took Carvedilol and one nitro PTA. En route, EMS states his HR was 160s when they arrived. EMS states that the patient had a run of Vtach en route but converted back into sinus rhythm with a rate consistent in the 70s. No diaphoresis, no LOC, no nausea, no vomiting. A&OX4. BP-117/74, HR-64, O2-96% 2L [place]. Pt has a defibrillator but states it did not fire tonight.

## 2014-09-08 ENCOUNTER — Other Ambulatory Visit (HOSPITAL_COMMUNITY): Payer: Self-pay

## 2014-09-08 ENCOUNTER — Emergency Department (HOSPITAL_COMMUNITY): Payer: Medicare Other

## 2014-09-08 DIAGNOSIS — R079 Chest pain, unspecified: Secondary | ICD-10-CM | POA: Diagnosis not present

## 2014-09-08 DIAGNOSIS — I472 Ventricular tachycardia: Secondary | ICD-10-CM | POA: Diagnosis not present

## 2014-09-08 DIAGNOSIS — I255 Ischemic cardiomyopathy: Secondary | ICD-10-CM | POA: Diagnosis not present

## 2014-09-08 LAB — APTT: aPTT: 29 s (ref 24–37)

## 2014-09-08 LAB — CBC WITH DIFFERENTIAL/PLATELET
Basophils Absolute: 0 10*3/uL (ref 0.0–0.1)
Basophils Relative: 0 % (ref 0–1)
Eosinophils Absolute: 0.2 10*3/uL (ref 0.0–0.7)
Eosinophils Relative: 4 % (ref 0–5)
HCT: 38.2 % — ABNORMAL LOW (ref 39.0–52.0)
Hemoglobin: 12.7 g/dL — ABNORMAL LOW (ref 13.0–17.0)
Lymphocytes Relative: 30 % (ref 12–46)
Lymphs Abs: 1.9 10*3/uL (ref 0.7–4.0)
MCH: 31.1 pg (ref 26.0–34.0)
MCHC: 33.2 g/dL (ref 30.0–36.0)
MCV: 93.4 fL (ref 78.0–100.0)
Monocytes Absolute: 0.7 10*3/uL (ref 0.1–1.0)
Monocytes Relative: 11 % (ref 3–12)
Neutro Abs: 3.6 10*3/uL (ref 1.7–7.7)
Neutrophils Relative %: 55 % (ref 43–77)
Platelets: 171 10*3/uL (ref 150–400)
RBC: 4.09 MIL/uL — ABNORMAL LOW (ref 4.22–5.81)
RDW: 14.4 % (ref 11.5–15.5)
WBC: 6.5 10*3/uL (ref 4.0–10.5)

## 2014-09-08 LAB — BASIC METABOLIC PANEL
Anion gap: 8 (ref 5–15)
BUN: 12 mg/dL (ref 6–20)
CO2: 22 mmol/L (ref 22–32)
Calcium: 8.9 mg/dL (ref 8.9–10.3)
Chloride: 107 mmol/L (ref 101–111)
Creatinine, Ser: 1.23 mg/dL (ref 0.61–1.24)
GFR calc Af Amer: >60 mL/min (ref >60)
GFR calc non Af Amer: 56 mL/min — ABNORMAL LOW (ref >60)
Glucose, Bld: 106 mg/dL — ABNORMAL HIGH (ref 65–99)
POTASSIUM: 3.7 mmol/L (ref 3.5–5.1)
Sodium: 137 mmol/L (ref 135–145)

## 2014-09-08 LAB — PROTIME-INR
INR: 1.07 (ref 0.00–1.49)
Prothrombin Time: 14.1 s (ref 11.6–15.2)

## 2014-09-08 LAB — TROPONIN I

## 2014-09-08 MED ORDER — AMIODARONE HCL 200 MG PO TABS
200.0000 mg | ORAL_TABLET | Freq: Once | ORAL | Status: AC
  Administered 2014-09-08: 200 mg via ORAL
  Filled 2014-09-08: qty 1

## 2014-09-08 MED ORDER — SPIRONOLACTONE 12.5 MG HALF TABLET
12.5000 mg | ORAL_TABLET | Freq: Every day | ORAL | Status: DC
  Administered 2014-09-08 – 2014-09-09 (×2): 12.5 mg via ORAL
  Filled 2014-09-08 (×2): qty 1

## 2014-09-08 MED ORDER — BENAZEPRIL HCL 10 MG PO TABS
10.0000 mg | ORAL_TABLET | Freq: Every day | ORAL | Status: DC
  Administered 2014-09-08 – 2014-09-09 (×2): 10 mg via ORAL
  Filled 2014-09-08 (×2): qty 1

## 2014-09-08 MED ORDER — IPRATROPIUM-ALBUTEROL 0.5-2.5 (3) MG/3ML IN SOLN: 3.0000 mL | Freq: Four times a day (QID) | RESPIRATORY_TRACT | Status: DC | PRN

## 2014-09-08 MED ORDER — BUSPIRONE HCL 15 MG PO TABS
7.5000 mg | ORAL_TABLET | Freq: Two times a day (BID) | ORAL | Status: DC
  Administered 2014-09-08 – 2014-09-09 (×3): 7.5 mg via ORAL
  Filled 2014-09-08 (×5): qty 1

## 2014-09-08 MED ORDER — SODIUM CHLORIDE 0.9 % IJ SOLN
3.0000 mL | Freq: Two times a day (BID) | INTRAMUSCULAR | Status: DC
  Administered 2014-09-08 (×2): 3 mL via INTRAVENOUS

## 2014-09-08 MED ORDER — GABAPENTIN 300 MG PO CAPS
300.0000 mg | ORAL_CAPSULE | Freq: Two times a day (BID) | ORAL | Status: DC
  Administered 2014-09-08 – 2014-09-09 (×3): 300 mg via ORAL
  Filled 2014-09-08 (×4): qty 1

## 2014-09-08 MED ORDER — PANTOPRAZOLE SODIUM 40 MG PO TBEC
40.0000 mg | DELAYED_RELEASE_TABLET | Freq: Every day | ORAL | Status: DC
  Administered 2014-09-08 – 2014-09-09 (×2): 40 mg via ORAL
  Filled 2014-09-08 (×2): qty 1

## 2014-09-08 MED ORDER — FUROSEMIDE 20 MG PO TABS
20.0000 mg | ORAL_TABLET | Freq: Every day | ORAL | Status: DC
  Administered 2014-09-08 – 2014-09-09 (×2): 20 mg via ORAL
  Filled 2014-09-08 (×2): qty 1

## 2014-09-08 MED ORDER — ONDANSETRON HCL 4 MG/2ML IJ SOLN: 4.0000 mg | Freq: Four times a day (QID) | INTRAMUSCULAR | Status: DC | PRN

## 2014-09-08 MED ORDER — SODIUM CHLORIDE 0.9 % IV SOLN: 250.0000 mL | INTRAVENOUS | Status: DC | PRN

## 2014-09-08 MED ORDER — AMIODARONE HCL 200 MG PO TABS
200.0000 mg | ORAL_TABLET | Freq: Every day | ORAL | Status: DC
  Administered 2014-09-08 – 2014-09-09 (×2): 200 mg via ORAL
  Filled 2014-09-08 (×2): qty 1

## 2014-09-08 MED ORDER — SODIUM CHLORIDE 0.9 % IJ SOLN: 3.0000 mL | INTRAMUSCULAR | Status: DC | PRN

## 2014-09-08 MED ORDER — BUDESONIDE-FORMOTEROL FUMARATE 160-4.5 MCG/ACT IN AERO
2.0000 | INHALATION_SPRAY | Freq: Two times a day (BID) | RESPIRATORY_TRACT | Status: DC
  Administered 2014-09-08 – 2014-09-09 (×3): 2 via RESPIRATORY_TRACT
  Filled 2014-09-08: qty 6

## 2014-09-08 MED ORDER — ASPIRIN EC 81 MG PO TBEC
81.0000 mg | DELAYED_RELEASE_TABLET | Freq: Every day | ORAL | Status: DC
  Administered 2014-09-08 – 2014-09-09 (×2): 81 mg via ORAL
  Filled 2014-09-08 (×2): qty 1

## 2014-09-08 MED ORDER — PAROXETINE HCL 20 MG PO TABS
40.0000 mg | ORAL_TABLET | Freq: Every morning | ORAL | Status: DC
  Administered 2014-09-08 – 2014-09-09 (×2): 40 mg via ORAL
  Filled 2014-09-08 (×2): qty 2

## 2014-09-08 MED ORDER — TAMSULOSIN HCL 0.4 MG PO CAPS
0.4000 mg | ORAL_CAPSULE | Freq: Every day | ORAL | Status: DC
  Administered 2014-09-08: 0.4 mg via ORAL
  Filled 2014-09-08 (×2): qty 1

## 2014-09-08 MED ORDER — TIOTROPIUM BROMIDE MONOHYDRATE 18 MCG IN CAPS
18.0000 ug | ORAL_CAPSULE | Freq: Every day | RESPIRATORY_TRACT | Status: DC
  Administered 2014-09-08 – 2014-09-09 (×2): 18 ug via RESPIRATORY_TRACT
  Filled 2014-09-08: qty 5

## 2014-09-08 MED ORDER — ACETAMINOPHEN 325 MG PO TABS: 650.0000 mg | ORAL_TABLET | ORAL | Status: DC | PRN

## 2014-09-08 MED ORDER — ATORVASTATIN CALCIUM 80 MG PO TABS
80.0000 mg | ORAL_TABLET | Freq: Every day | ORAL | Status: DC
  Administered 2014-09-08: 80 mg via ORAL
  Filled 2014-09-08 (×3): qty 1

## 2014-09-08 MED ORDER — CARVEDILOL 12.5 MG PO TABS
12.5000 mg | ORAL_TABLET | Freq: Two times a day (BID) | ORAL | Status: DC
  Administered 2014-09-08 – 2014-09-09 (×3): 12.5 mg via ORAL
  Filled 2014-09-08 (×5): qty 1

## 2014-09-08 NOTE — ED Notes (Signed)
[facility] [employer] called @0023 .

## 2014-09-08 NOTE — ED Notes (Signed)
Steven Guzman, [facility] is on his way to interrogate pts pacemaker.

## 2014-09-08 NOTE — ED Notes (Signed)
[facility] paged to interrogate pt's defibrillator.

## 2014-09-08 NOTE — ED Notes (Signed)
Steven Guzman with [facility] at bedside.

## 2014-09-08 NOTE — ED Provider Notes (Signed)
CSN: [id number]     Arrival date & time 09/07/14  2340 History  This chart was scribed for Varney Biles, MD by Peyton Bottoms, ED Scribe. This patient was seen in room D34C/D34C and the patient's care was started at 1:44 AM.   Chief Complaint  Patient presents with  . Chest Pain  . Tachycardia   Patient is a 74 y.o. male presenting with chest pain. The history is provided by the patient. No language interpreter was used.  Chest Pain   HPI Comments: Steven Guzman is a 74 y.o. male with a PMHx of hypertension, hyperlipidemia, COPD, CAD, pacemaker and cardiac catheterization, who presents to the Emergency Department complaining of moderate chest pain and increased heart rate onset 10:30PM and lasted 15 minutes. Patient states he attended a funeral earlier today and smoked several cigarettes. Patient is seen by cardiologists Dr. Tamala Julian and Dr. Warrick Parisian. Patient states that he last had his pacemaker checked 1 month ago. He states he has had 4 shocks by same device since 2011.  Past Medical History  Diagnosis Date  . Hypertension   . Hyperlipidemia   . COPD (chronic obstructive pulmonary disease)     a. followed by pulmonary, COPD GOLD stage II  . GERD (gastroesophageal reflux disease)   . Diverticulitis   . CAD (coronary artery disease)     a. s/p MI in 1994 with PCI to LAD at that time b. cath 10/2012 demonstrated EF 30%, inferior akinesis with mild hypokinesis of all walls, patent LAD and RCA stents; ostial PDA with 80-90% obstruction with medical therapy recommended   . Depression   . Aneurysm     abdominal <4  . Tobacco abuse   . Ventricular tachycardia     a. 08/2009 s/p BSX E110 Teligen 100 AICD, ser#: [id number];  b. 08/2008 VT req ATP - detection reprogrammed from 160 to 150.  [name] Chronic systolic CHF (congestive heart failure)    Past Surgical History  Procedure Laterality Date  . Hernia repair    . Foot surgery      lt  . Hemorrhoid banding    . Retinal detachment surgery       right  . Angioplasty    . Cardiac catheterization    . Nose surgery      skin cancer-mohs-skin graft  . Colonoscopy    . Tenolysis Right 12/21/2013    Procedure: TENOLYSIS FLEXOR CARPI RADIALIS ,DEBRIDEMENT RIGHT JOINT WRIST,DEBRIDEMENT SCAPHOTRAPEZIAL TRAPEZOID, REPAIR OF EXTENSOR HOOD;  Surgeon: Daryll Brod, MD;  Location: [facility];  Service: Orthopedics;  Laterality: Right;  . Left heart catheterization with coronary angiogram N/A 11/25/2012    demonstrated EF 30%, inferior akinesis with mild hypokinesis of all walls, patent LAD and RCA stents; ostial PDA with 80-90% obstruction with medical therapy recommended   Family History  Problem Relation Age of Onset  . Heart attack Brother   . CAD Father   . CAD Mother    History  Substance Use Topics  . Smoking status: Current Every Day Smoker -- 0.50 packs/day for 55 years    Types: Cigarettes  . Smokeless tobacco: Never Used     Comment: attending the "stop smoking" classes through [facility]  . Alcohol Use: 0.0 oz/week    0 Standard drinks or equivalent per week     Comment: occasionally    Review of Systems  Cardiovascular: Positive for chest pain.  All other systems reviewed and are negative.  Allergies  Sulfa antibiotics  Home Medications  Prior to Admission medications   Medication Sig Start Date End Date Taking? Authorizing Provider  amiodarone (PACERONE) 200 MG tablet Take 1 tablet (200 mg total) by mouth daily. 07/18/14  Yes [name], MD  aspirin 81 MG tablet Take 81 mg by mouth daily.   Yes Historical Provider, MD  atorvastatin (LIPITOR) 80 MG tablet Take 1 tablet (80 mg total) by mouth daily. 04/12/14  Yes Belva Crome, MD  benazepril (LOTENSIN) 10 MG tablet Take 1 tablet (10 mg total) by mouth daily. 08/22/14  Yes Belva Crome, MD  budesonide-formoterol [facility]) 160-4.5 MCG/ACT inhaler Take 2 puffs first thing in am and then another 2 puffs about 12 hours later. Patient taking differently:  Inhale 2 puffs into the lungs first thing in the am and then inhale another 2 puffs into the lungs about 12 hours later. 02/06/14  Yes Tanda Rockers, MD  busPIRone (BUSPAR) 15 MG tablet Take 7.5 mg by mouth 2 (two) times daily. Take one half tablet in the morning and one half tablet in the evening. 06/04/14  Yes Historical Provider, MD  carvedilol (COREG) 12.5 MG tablet Take 1 tablet (12.5 mg total) by mouth 2 (two) times daily with a meal. 03/19/14  Yes Belva Crome, MD  cetirizine (ZYRTEC) 10 MG tablet Take 10 mg by mouth daily as needed for allergies.   Yes Historical Provider, MD  Choline Fenofibrate (TRILIPIX) 135 MG capsule Take 1 capsule (135 mg total) by mouth daily. 12/25/13  Yes Belva Crome, MD  COMBIVENT RESPIMAT 20-100 MCG/ACT AERS respimat Inhale 2 puffs into the lungs every 6 (six) hours as needed. Shortness of breath or wheezing 01/28/14  Yes Historical Provider, MD  furosemide (LASIX) 40 MG tablet Take 20 mg by mouth daily.    Yes Historical Provider, MD  gabapentin (NEURONTIN) 100 MG capsule Take 300 mg by mouth 2 (two) times daily.  02/08/14  Yes Historical Provider, MD  nitroGLYCERIN (NITROSTAT) 0.4 MG SL tablet Place 1 tablet (0.4 mg total) under the tongue every 5 (five) minutes as needed for chest pain. 11/25/12  Yes Belva Crome, MD  omeprazole (PRILOSEC) 20 MG capsule Take 20 mg by mouth 2 (two) times daily.   Yes Historical Provider, MD  PARoxetine (PAXIL) 40 MG tablet take 1 tablet by mouth every morning 07/19/14  Yes Olga Millers, MD  polyethylene glycol [facility]) packet Take 17 g by mouth daily. Patient taking differently: Take 17 g by mouth daily as needed for mild constipation.  08/09/14  Yes Alfonzo Beers, MD  spironolactone (ALDACTONE) 25 MG tablet Take 0.5 tablets (12.5 mg total) by mouth daily. 02/28/14  Yes Belva Crome, MD  tamsulosin (FLOMAX) 0.4 MG CAPS capsule Take 1 capsule (0.4 mg total) by mouth daily after supper. 05/21/14  Yes Olga Millers, MD   tiotropium (SPIRIVA) 18 MCG inhalation capsule Place 1 capsule (18 mcg total) into inhaler and inhale daily. 06/25/14  Yes Juanito Doom, MD   Triage Vitals: BP 92/68 mmHg  Pulse 75  Resp 15  SpO2 92%  Physical Exam  Constitutional: He is oriented to person, place, and time. He appears well-developed and well-nourished. No distress.  HENT:  Head: Normocephalic and atraumatic.  Eyes: Conjunctivae and EOM are normal.  Neck: Neck supple. No tracheal deviation present.  Cardiovascular: Normal rate, regular rhythm and normal heart sounds.   Pulmonary/Chest: Effort normal and breath sounds normal. No respiratory distress. He has no wheezes.  Abdominal: Soft. There  is no tenderness.  Abdomen soft.  Musculoskeletal: Normal range of motion.  2+ radial pulse bilaterally. Lower extremity no pitting edema appreciated.   Neurological: He is alert and oriented to person, place, and time.  GCS 15.   Skin: Skin is warm and dry.  Psychiatric: He has a normal mood and affect. His behavior is normal.  Nursing note and vitals reviewed.  ED Course  Procedures (including critical care time)  DIAGNOSTIC STUDIES: Oxygen Saturation is 92% on [place], low by my interpretation.    COORDINATION OF CARE: 1:54 AM- Discussed plans to admit patient. Pt advised of plan for treatment and pt agrees.  Labs Review Labs Reviewed  CBC WITH DIFFERENTIAL/PLATELET - Abnormal; Notable for the following:    RBC 4.09 (*)    Hemoglobin 12.7 (*)    HCT 38.2 (*)    All other components within normal limits  BASIC METABOLIC PANEL - Abnormal; Notable for the following:    Glucose, Bld 106 (*)    GFR calc non Af Amer 56 (*)    All other components within normal limits  BASIC METABOLIC PANEL - Abnormal; Notable for the following:    Glucose, Bld 101 (*)    All other components within normal limits  TROPONIN I  APTT  PROTIME-INR   Imaging Review Dg Chest Port 1 View  09/08/2014   CLINICAL DATA:  Acute onset of  generalized chest pain and shortness of breath. Initial encounter.  EXAM: PORTABLE CHEST - 1 VIEW  COMPARISON:  Chest radiograph performed 08/15/2014  FINDINGS: The lungs are well-aerated and clear. There is no evidence of focal opacification, pleural effusion or pneumothorax.  The cardiomediastinal silhouette is normal in size. A pacemaker/AICD is noted overlying the left chest wall, with leads ending overlying the right atrium and right ventricle. No acute osseous abnormalities are seen. External pacing pads are noted.  IMPRESSION: No acute cardiopulmonary process seen.   Electronically Signed   By: Garald Balding M.D.   On: 09/08/2014 01:31    EKG Interpretation   Date/Time:  Friday September 07 2014 23:49:32 EDT Ventricular Rate:  60 PR Interval:  136 QRS Duration: 118 QT Interval:  471 QTC Calculation: 471 R Axis:   87 Text Interpretation:  Sinus or ectopic atrial rhythm Atrial premature  complex Nonspecific intraventricular conduction delay Abnormal inferior Q  waves Nonspecific ST and T wave abnormality Confirmed by Kathrynn Humble, MD,  [place] Ing [id number]) on 09/08/2014 12:39:08 AM     MDM   Final diagnoses:  Chest pain  Ventricular Fibrillation   I personally performed the services described in this documentation, which was scribed in my presence. The recorded information has been reviewed and is accurate.  Pt comes in with cc of palpitations, dib, weakness. He has defibrillator in place, no firing. We interrogated the device - and he certainly was in vfib, and the device help abort that rhythm. Cards consulted -requesting admission for possible reprogramming.  Varney Biles, MD 09/10/14 [phone]

## 2014-09-08 NOTE — ED Notes (Signed)
Pt placed on zoll upon arrival to ED.

## 2014-09-08 NOTE — Progress Notes (Signed)
Called by RN. Pt says Dr Claiborne Billings told the pt he was going to increase Amiodarone but no orders written. I gave him a extra dose tonight, will review plan in am.   Kerin Ransom PA-C 09/08/2014 3:08 PM

## 2014-09-08 NOTE — Progress Notes (Signed)
Patient admitted through the ed with Increased Heart Rate. On admission Hr has been corrected. Alert and oriented, up and lib. Placed on cardiac monitor and oriented to the room. Call bell placed within reach. Will keep monitoring.

## 2014-09-08 NOTE — H&P (Signed)
CARDIOLOGY ADMISSION NOTE  Assessment and Plan:  *Ventricular tachycardia: Steven Guzman 75 year old male with history of ischemic cardiomyopathy LVEF 30% and history of ventricular tachycardia with at least 4 episodes of defibrillations in the past with most recent in 2015 comes to the emergency department with an episode of sustained ventricular tachycardia. After ATP treatment, patient is back in a paced V sensed rhythm with a heart rate in 60s. He appears fairly well compensated. He endorses compliance with his medications including beta blockers and amiodarone. This is his third episode of ventricular tachycardia and last 6 months. --Increasing amiodarone to 400 mg daily after discussing with Dr. Cristopher Peru. --consider ICD tachycardia therapy changes with also be adding a zone for ATP at lower HR.   *Ischemic cardiomyopathy LVEF 30%: Patient has history of ischemic cardiomyopathy. Currently he is NYHA class II. He has currently CCS class 0 symptoms. -- Continue aspirin 81 mg daily. -- Continue atorvastatin 80 mg daily -- Continue benazepril 10 mg daily -- Continue carvedilol 12.5 mg by mouth twice a day. No room to uptitrate --Continue Lasix 20 mg daily -- Of note, patient was on spironolactone in the past but was discontinued due to hypotension.  *Hypertension:  well controlled. Continue home meds.   Chief complaint: Fast heart rate  HPI:  Steven Guzman is a 74 year old male with history of ischemic cardiomyopathy with LVEF 30%, history of ventricular tachycardia, hypertension, dyslipidemia comes to the emergency department after noticing sudden onset heart palpitations around 10:30 PM. He immediately checked his heart rate and was noted to be 150 bpm. He contacted EMS who again confirmed that his heart rate was 150s to 160s. After 20 minutes of sustained ventricular tachycardia, the heart rate went high enough to be in the ATP zone and patient reportedly received one round if ATP  therapy with successful conversion back to sinus rhythm. Patient is otherwise feeling well without any signs of acute coroner syndrome or decubs that heart failure. Prior to this event, patient again denies any symptoms suggestive of volume overload. He denies any changes in his functional status of recent. He also endorses compliance with his medications and diet.  Past Medical History Past Medical History  Diagnosis Date  . Hypertension   . Hyperlipidemia   . COPD (chronic obstructive pulmonary disease)     a. followed by pulmonary, COPD GOLD stage II  . GERD (gastroesophageal reflux disease)   . Diverticulitis   . CAD (coronary artery disease)     a. s/p MI in 1994 with PCI to LAD at that time b. cath 10/2012 demonstrated EF 30%, inferior akinesis with mild hypokinesis of all walls, patent LAD and RCA stents; ostial PDA with 80-90% obstruction with medical therapy recommended   . Depression   . Aneurysm     abdominal <4  . Tobacco abuse     Allergies: Allergies  Allergen Reactions  . Sulfa Antibiotics     hives    Social History History   Social History  . Marital Status: Married    Spouse Name: N/A  . Number of Children: N/A  . Years of Education: N/A   Occupational History  . Not on file.   Social History Main Topics  . Smoking status: Current Every Day Smoker -- 0.50 packs/day for 55 years    Types: Cigarettes  . Smokeless tobacco: Never Used     Comment: attending the "stop smoking" classes through [place]  . Alcohol Use: 0.0 oz/week    0  Standard drinks or equivalent per week     Comment: occasionally   . Drug Use: No  . Sexual Activity: Not Currently   Other Topics Concern  . Not on file   Social History Narrative    Family History Family History  Problem Relation Age of Onset  . Heart attack Brother   . CAD Father   . CAD Mother     Physical Exam Filed Vitals:   09/08/14 0200  BP: 112/62  Pulse: 59  Resp: 17    Gen: comfortable appearing  in no acute distress  HEENT: extra ocular motions intact  Neck:supple, no JVD  CV regular rate rhythm, normal S1, normal S2, question S3  Pulm: clear to auscultation bilaterally  Abdomen: soft, nontender, nondistended Ext: no cyanosis clubbing or edema   Labs:  Results for orders placed or performed during the hospital encounter of 09/07/14 (from the past 24 hour(s))  CBC with Differential/Platelet     Status: Abnormal   Collection Time: 09/08/14 12:49 AM  Result Value Ref Range   WBC 6.5 4.0 - 10.5 K/uL   RBC 4.09 (L) 4.22 - 5.81 MIL/uL   Hemoglobin 12.7 (L) 13.0 - 17.0 g/dL   HCT 38.2 (L) 39.0 - 52.0 %   MCV 93.4 78.0 - 100.0 fL   MCH 31.1 26.0 - 34.0 pg   MCHC 33.2 30.0 - 36.0 g/dL   RDW 14.4 11.5 - 15.5 %   Platelets 171 150 - 400 K/uL   Neutrophils Relative % 55 43 - 77 %   Neutro Abs 3.6 1.7 - 7.7 K/uL   Lymphocytes Relative 30 12 - 46 %   Lymphs Abs 1.9 0.7 - 4.0 K/uL   Monocytes Relative 11 3 - 12 %   Monocytes Absolute 0.7 0.1 - 1.0 K/uL   Eosinophils Relative 4 0 - 5 %   Eosinophils Absolute 0.2 0.0 - 0.7 K/uL   Basophils Relative 0 0 - 1 %   Basophils Absolute 0.0 0.0 - 0.1 K/uL  Basic metabolic panel     Status: Abnormal   Collection Time: 09/08/14 12:49 AM  Result Value Ref Range   Sodium 137 135 - 145 mmol/L   Potassium 3.7 3.5 - 5.1 mmol/L   Chloride 107 101 - 111 mmol/L   CO2 22 22 - 32 mmol/L   Glucose, Bld 106 (H) 65 - 99 mg/dL   BUN 12 6 - 20 mg/dL   Creatinine, Ser 1.23 0.61 - 1.24 mg/dL   Calcium 8.9 8.9 - 10.3 mg/dL   GFR calc non Af Amer 56 (L) >60 mL/min   GFR calc Af Amer >60 >60 mL/min   Anion gap 8 5 - 15  Troponin I     Status: None   Collection Time: 09/08/14 12:49 AM  Result Value Ref Range   Troponin I <0.03 <0.031 ng/mL  APTT     Status: None   Collection Time: 09/08/14 12:49 AM  Result Value Ref Range   aPTT 29 24 - 37 seconds  Protime-INR     Status: None   Collection Time: 09/08/14 12:49 AM  Result Value Ref Range    Prothrombin Time 14.1 11.6 - 15.2 seconds   INR 1.07 0.00 - 1.49

## 2014-09-09 ENCOUNTER — Encounter (HOSPITAL_COMMUNITY): Payer: Self-pay | Admitting: Nurse Practitioner

## 2014-09-09 ENCOUNTER — Observation Stay (HOSPITAL_COMMUNITY): Payer: Medicare Other

## 2014-09-09 DIAGNOSIS — I472 Ventricular tachycardia: Secondary | ICD-10-CM | POA: Diagnosis not present

## 2014-09-09 DIAGNOSIS — R0600 Dyspnea, unspecified: Secondary | ICD-10-CM

## 2014-09-09 DIAGNOSIS — I5022 Chronic systolic (congestive) heart failure: Secondary | ICD-10-CM | POA: Diagnosis present

## 2014-09-09 LAB — BASIC METABOLIC PANEL
Anion gap: 7 (ref 5–15)
BUN: 10 mg/dL (ref 6–20)
CO2: 28 mmol/L (ref 22–32)
Calcium: 9.1 mg/dL (ref 8.9–10.3)
Chloride: 103 mmol/L (ref 101–111)
Creatinine, Ser: 1.12 mg/dL (ref 0.61–1.24)
GFR calc Af Amer: >60 mL/min (ref >60)
GLUCOSE: 101 mg/dL — AB (ref 65–99)
Potassium: 4.2 mmol/L (ref 3.5–5.1)
Sodium: 138 mmol/L (ref 135–145)

## 2014-09-09 NOTE — Progress Notes (Signed)
Pt/family given discharge instructions, medication lists, follow up appointments, and when to call the doctor.  Pt/family verbalizes understanding. Pt given signs and symptoms of infection. Charmon Thorson McClintock, RN    

## 2014-09-09 NOTE — Discharge Instructions (Signed)
***  PLEASE REMEMBER TO BRING ALL OF YOUR MEDICATIONS TO EACH OF YOUR FOLLOW-UP OFFICE VISITS.  

## 2014-09-09 NOTE — Consult Note (Addendum)
Reason for Consult:Ventricular tachycardia  Referring Physician: Dr. Olin Hauser Steven Guzman is an 74 y.o. male.   HPI: The patient is a 74 yo man with chronic systolic heart failure, an ICM, VT, s/p ICD implant, ongoing tobacco abuse who was admitted with sustained VT below his device detection. He then accelerated into the ATP zone and was appropriately pace terminated with ATP. No shock. The patient had associated sob and chest pressure. His symptoms resolved with return to normal rhythym. He ruled out for MI. He denies medical non-compliance.   PMH: Past Medical History  Diagnosis Date  . Hypertension   . Hyperlipidemia   . COPD (chronic obstructive pulmonary disease)     a. followed by pulmonary, COPD GOLD stage II  . GERD (gastroesophageal reflux disease)   . Diverticulitis   . CAD (coronary artery disease)     a. s/p MI in 1994 with PCI to LAD at that time b. cath 10/2012 demonstrated EF 30%, inferior akinesis with mild hypokinesis of all walls, patent LAD and RCA stents; ostial PDA with 80-90% obstruction with medical therapy recommended   . Depression   . Aneurysm     abdominal <4  . Tobacco abuse     PSHX: Past Surgical History  Procedure Laterality Date  . Hernia repair    . Foot surgery      lt  . Hemorrhoid banding    . Retinal detachment surgery      right  . Angioplasty    . Cardiac catheterization    . Nose surgery      skin cancer-mohs-skin graft  . Colonoscopy    . Tenolysis Right 12/21/2013    Procedure: TENOLYSIS FLEXOR CARPI RADIALIS ,DEBRIDEMENT RIGHT JOINT WRIST,DEBRIDEMENT SCAPHOTRAPEZIAL TRAPEZOID, REPAIR OF EXTENSOR HOOD;  Surgeon: Daryll Brod, MD;  Location: [facility];  Service: Orthopedics;  Laterality: Right;  . Left heart catheterization with coronary angiogram N/A 11/25/2012    demonstrated EF 30%, inferior akinesis with mild hypokinesis of all walls, patent LAD and RCA stents; ostial PDA with 80-90% obstruction with medical  therapy recommended    FAMHX: Family History  Problem Relation Age of Onset  . Heart attack Brother   . CAD Father   . CAD Mother     Social History:  reports that he has been smoking Cigarettes.  He has a 27.5 pack-year smoking history. He has never used smokeless tobacco. He reports that he drinks alcohol. He reports that he does not use illicit drugs.  Allergies:  Allergies  Allergen Reactions  . Sulfa Antibiotics     hives    Medications: I have reviewed the patient's current medications.  Dg Chest Port 1 View  09/08/2014   CLINICAL DATA:  Acute onset of generalized chest pain and shortness of breath. Initial encounter.  EXAM: PORTABLE CHEST - 1 VIEW  COMPARISON:  Chest radiograph performed 08/15/2014  FINDINGS: The lungs are well-aerated and clear. There is no evidence of focal opacification, pleural effusion or pneumothorax.  The cardiomediastinal silhouette is normal in size. A pacemaker/AICD is noted overlying the left chest wall, with leads ending overlying the right atrium and right ventricle. No acute osseous abnormalities are seen. External pacing pads are noted.  IMPRESSION: No acute cardiopulmonary process seen.   Electronically Signed   By: Garald Balding M.D.   On: 09/08/2014 01:31    ROS  As stated in the HPI and negative for all other systems.  Physical Exam  Vitals:Blood pressure 109/66,  pulse 59, temperature 98.2 F (36.8 C), temperature source Oral, resp. rate 18, height 5\' 11"  (1.803 m), weight 213 lb 13.5 oz (97 kg), SpO2 97 %.  Well appearing NAD HEENT: Unremarkable Neck:  No JVD, no thyromegally Lymphatics:  No adenopathy Back:  No CVA tenderness Lungs:  Clear HEART:  Regular rate rhythm, no murmurs, no rubs, no clicks Abd:  Flat, positive bowel sounds, no organomegally, no rebound, no guarding Ext:  2 plus pulses, no edema, no cyanosis, no clubbing Skin:  No rashes no nodules Neuro:  CN II through XII intact, motor grossly  intact  Assessment/Plan: 1. Recurrent VT, below the device detection rate - I have reprogrammed the device detection from 160 to 150. I would like him to continue his current meds and not increase amiodarone. If he has more VT, will plan to proceed with VT ablation which would be scheduled as an outpatient. 2. ICM - he has not had angina except with his VT. Enzymes are negative. No additional workup 3. Chronic systolic heart failure - he appears euvolemic. Continue current meds. I encouraged him to reduce his sodium intake. 4. Tobacco abuse - Smoking cessation encouraged.  5. Disposition - ok for discharge home. I would like to see him back in 3 months. Schedule him to see Chanetta Marshall in 2 weeks.  Carleene Overlie TaylorMD 09/09/2014, 8:55 AM

## 2014-09-09 NOTE — Discharge Summary (Signed)
Discharge Summary   Patient ID: Steven Guzman,  MRN: [id number], DOB/AGE: Feb 12, 1941 74 y.o.  Admit date: 09/07/2014 Discharge date: 09/09/2014  Primary Care Provider: Olga Millers Primary Cardiologist: Linard Millers, MD/ G. Lovena Le, MD (EP)  Discharge Diagnoses Principal Problem:   Ventricular tachycardia  **s/p appropriate ATP delivered by ICD.  Active Problems:   ICD (implantable cardioverter-defibrillator) in place   Cigarette smoker   Chronic systolic CHF (congestive heart failure)/ischemic cardiomyopathy   Essential hypertension   Hyperlipidemia  Allergies Allergies  Allergen Reactions  . Sulfa Antibiotics     hives    Procedures  None  History of Present Illness  74 year old male with prior history of coronary artery disease and an ischemic cardiomyopathy with an EF of 30% status post AICD placement. He also has a history of ventricular tachycardia. He was in his usual state of health until approximately 10:30 PM on June 10, when he had sudden onset of tachypalpitations. His pulse rate was approximately 150 bpm by his count. He called EMS and following their arrival, heart rate rose to over 160 bpm. He was treated with one round of antitachycardia pacing by his AICD and converted to sinus rhythm. He was otherwise stable. He was taken by EMS to the ED for further evaluation and subsequently admitted for observation.  Hospital Course  Patient's troponin was normal following admission. He had no further ventricular tachycardia. Initially, amiodarone was increased to 400 mg daily however after evaluation by electrophysiology, recommendation was made to continue prior home dose of amiodarone and beta blocker therapy. His ventricular tachycardia detection zone was decreased from 160 beats minute to 150 beats a minute. In the absence of objective evidence of ischemia or chest pain, it was not felt that he required an additional ischemic evaluation at this time. Patient will  be discharged home today in good condition with follow-up in the left physiology clinic in 2 weeks.  Discharge Vitals Blood pressure 109/66, pulse 59, temperature 98.2 F (36.8 C), temperature source Oral, resp. rate 18, height 5\' 11"  (1.803 m), weight 213 lb 13.5 oz (97 kg), SpO2 94 %.  Filed Weights   09/08/14 0302 09/09/14 0507  Weight: 217 lb 2.5 oz (98.5 kg) 213 lb 13.5 oz (97 kg)    Labs  CBC  Recent Labs  09/08/14 0049  WBC 6.5  NEUTROABS 3.6  HGB 12.7*  HCT 38.2*  MCV 93.4  PLT 540   Basic Metabolic Panel  Recent Labs  09/08/14 0049 09/09/14 0513  NA 137 138  K 3.7 4.2  CL 107 103  CO2 22 28  GLUCOSE 106* 101*  BUN 12 10  CREATININE 1.23 1.12  CALCIUM 8.9 9.1   Cardiac Enzymes  Recent Labs  09/08/14 0049  TROPONINI <0.03   Disposition  Pt is being discharged home today in good condition.  Follow-up Plans & Appointments      Follow-up Information    Follow up with Patsey Berthold, NP On 09/26/2014.   Specialty:  Nurse Practitioner   Why:  at [facility] information:   [place] [address] [phone]       Follow up with Cristopher Peru, MD In 3 months.   Specialty:  Cardiology   Why:  we will arrange and contact you.   Contact information:   [address] [place] [address] [phone]       Follow up with Olga Millers, MD.   Specialty:  Internal  Medicine   Why:  As needed   Contact information:   [address] [place] [address] [phone]       Discharge Medications    Medication List    TAKE these medications        amiodarone 200 MG tablet  Commonly known as:  PACERONE  Take 1 tablet (200 mg total) by mouth daily.     aspirin 81 MG tablet  Take 81 mg by mouth daily.     atorvastatin 80 MG tablet  Commonly known as:  LIPITOR  Take 1 tablet (80 mg total) by mouth daily.     benazepril 10 MG tablet  Commonly known as:  LOTENSIN  Take 1 tablet (10 mg  total) by mouth daily.     budesonide-formoterol 160-4.5 MCG/ACT inhaler  Commonly known as:  SYMBICORT  Take 2 puffs first thing in am and then another 2 puffs about 12 hours later.     busPIRone 15 MG tablet  Commonly known as:  BUSPAR  Take 7.5 mg by mouth 2 (two) times daily. Take one half tablet in the morning and one half tablet in the evening.     carvedilol 12.5 MG tablet  Commonly known as:  COREG  Take 1 tablet (12.5 mg total) by mouth 2 (two) times daily with a meal.     cetirizine 10 MG tablet  Commonly known as:  ZYRTEC  Take 10 mg by mouth daily as needed for allergies.     Choline Fenofibrate 135 MG capsule  Commonly known as:  TRILIPIX  Take 1 capsule (135 mg total) by mouth daily.     COMBIVENT RESPIMAT 20-100 MCG/ACT Aers respimat  Generic drug:  Ipratropium-Albuterol  Inhale 2 puffs into the lungs every 6 (six) hours as needed. Shortness of breath or wheezing     furosemide 40 MG tablet  Commonly known as:  LASIX  Take 20 mg by mouth daily.     gabapentin 100 MG capsule  Commonly known as:  NEURONTIN  Take 300 mg by mouth 2 (two) times daily.     nitroGLYCERIN 0.4 MG SL tablet  Commonly known as:  NITROSTAT  Place 1 tablet (0.4 mg total) under the tongue every 5 (five) minutes as needed for chest pain.     omeprazole 20 MG capsule  Commonly known as:  PRILOSEC  Take 20 mg by mouth 2 (two) times daily.     PARoxetine 40 MG tablet  Commonly known as:  PAXIL  take 1 tablet by mouth every morning     polyethylene glycol packet  Commonly known as:  MIRALAX  Take 17 g by mouth daily.     spironolactone 25 MG tablet  Commonly known as:  ALDACTONE  Take 0.5 tablets (12.5 mg total) by mouth daily.     tamsulosin 0.4 MG Caps capsule  Commonly known as:  FLOMAX  Take 1 capsule (0.4 mg total) by mouth daily after supper.     tiotropium 18 MCG inhalation capsule  Commonly known as:  SPIRIVA  Place 1 capsule (18 mcg total) into inhaler and inhale  daily.        Outstanding Labs/Studies  None  Duration of Discharge Encounter   Greater than 30 minutes including physician time.  Signed, Murray Hodgkins NP 09/09/2014, 9:32 AM    EP Attending  Patient seen and examined. Agree with above. Amelia for discharge  Mikle Bosworth.D.

## 2014-09-09 NOTE — Progress Notes (Signed)
  Echocardiogram 2D Echocardiogram has been performed.  Jennette Dubin 09/09/2014, 11:57 AM

## 2014-09-11 ENCOUNTER — Telehealth: Payer: Self-pay | Admitting: *Deleted

## 2014-09-11 ENCOUNTER — Telehealth: Payer: Self-pay | Admitting: Internal Medicine

## 2014-09-11 NOTE — Telephone Encounter (Signed)
Pt was on tcm list admitted for ventricular tachycardia d/c 09/09/14, and will f.u with cardiology in 2 weeks on 09/26/14...Johny Chess

## 2014-09-11 NOTE — Telephone Encounter (Signed)
14 day TOC fu appt per Chris--appt with Amber 6-29

## 2014-09-12 NOTE — Telephone Encounter (Signed)
Patient contacted regarding discharge from [facility] on 6/12.  Patient understands to follow up with provider Chanetta Marshall, NP on June 29 at 2:00 pm at [facility]. Patient understands discharge instructions? Yes Patient understands medications and regiment? Yes Patient understands to bring all medications to this visit? Yes  Patient denies complaints I advised him to call back with questions or concerns prior to follow-up appointment

## 2014-09-19 ENCOUNTER — Other Ambulatory Visit: Payer: Self-pay

## 2014-09-19 MED ORDER — SPIRONOLACTONE 25 MG PO TABS: 12.5000 mg | ORAL_TABLET | Freq: Every day | ORAL | Status: DC

## 2014-09-20 ENCOUNTER — Other Ambulatory Visit: Payer: Self-pay

## 2014-09-20 MED ORDER — IPRATROPIUM-ALBUTEROL 20-100 MCG/ACT IN AERS: 2.0000 | INHALATION_SPRAY | Freq: Four times a day (QID) | RESPIRATORY_TRACT | Status: DC

## 2014-09-21 ENCOUNTER — Telehealth: Payer: Self-pay | Admitting: Pulmonary Disease

## 2014-09-21 MED ORDER — IPRATROPIUM-ALBUTEROL 20-100 MCG/ACT IN AERS: 1.0000 | INHALATION_SPRAY | Freq: Four times a day (QID) | RESPIRATORY_TRACT | Status: DC

## 2014-09-21 NOTE — Telephone Encounter (Signed)
One qid is correct

## 2014-09-21 NOTE — Telephone Encounter (Signed)
Pharmacy called   Per MW, pharmacist, Roselyn Reef, informed that the instructions for Combivent should be 1 puff 4 times a day.  Roselyn Reef stated that she would correct the rx instructions to reflect this .  Medication instructions were corrected ion pt's med list in Epic   Nothing further is needed at this time.

## 2014-09-21 NOTE — Telephone Encounter (Signed)
Steven Guzman from [facility], she says that the Combivent is usually given as 1 puff, 4 times a day, this prescription is written for 2 puffs, 4 times a day.  Prescription was originally prescribed by Dr. Posey Pronto in the hospital.  Steven Guzman says that Dr. Lake Bells refilled this prescription yesterday and she needs clarification as to how patient should take it.  There is nothing in the chart documenting refill.    MW - please advise in BQ absence.  Thanks.

## 2014-09-22 ENCOUNTER — Other Ambulatory Visit: Payer: Self-pay | Admitting: Internal Medicine

## 2014-09-25 ENCOUNTER — Encounter: Payer: Self-pay | Admitting: Nurse Practitioner

## 2014-09-25 NOTE — Progress Notes (Signed)
No show for appt  This encounter was created in error - please disregard. 

## 2014-09-26 ENCOUNTER — Encounter: Payer: Medicare Other | Admitting: Nurse Practitioner

## 2014-09-27 ENCOUNTER — Ambulatory Visit (INDEPENDENT_AMBULATORY_CARE_PROVIDER_SITE_OTHER): Payer: Medicare Other | Admitting: Interventional Cardiology

## 2014-09-27 ENCOUNTER — Encounter: Payer: Self-pay | Admitting: Interventional Cardiology

## 2014-09-27 ENCOUNTER — Encounter: Payer: Self-pay | Admitting: Internal Medicine

## 2014-09-27 VITALS — BP 86/64 | HR 61 | Ht 71.0 in | Wt 217.0 lb

## 2014-09-27 DIAGNOSIS — I472 Ventricular tachycardia, unspecified: Secondary | ICD-10-CM

## 2014-09-27 DIAGNOSIS — Z95810 Presence of automatic (implantable) cardiac defibrillator: Secondary | ICD-10-CM

## 2014-09-27 DIAGNOSIS — I10 Essential (primary) hypertension: Secondary | ICD-10-CM | POA: Diagnosis not present

## 2014-09-27 DIAGNOSIS — Z79899 Other long term (current) drug therapy: Secondary | ICD-10-CM | POA: Diagnosis not present

## 2014-09-27 DIAGNOSIS — I2510 Atherosclerotic heart disease of native coronary artery without angina pectoris: Secondary | ICD-10-CM | POA: Diagnosis not present

## 2014-09-27 DIAGNOSIS — I5022 Chronic systolic (congestive) heart failure: Secondary | ICD-10-CM | POA: Diagnosis not present

## 2014-09-27 NOTE — Patient Instructions (Signed)
Medication Instructions:  Your physician recommends that you continue on your current medications as directed. Please refer to the Current Medication list given to you today.   Labwork: Tsh, Hepatic today  Testing/Procedures: A chest x-ray takes a picture of the organs and structures inside the chest, including the heart, lungs, and blood vessels. This test can show several things, including, whether the heart is enlarges; whether fluid is building up in the lungs; and whether pacemaker / defibrillator leads are still in place. (To be done in 6 months)  Follow-Up: Your physician wants you to follow-up in: 6 months with Dr.Smith You will receive a reminder letter in the mail two months in advance. If you don't receive a letter, please call our office to schedule the follow-up appointment.   Any Other Special Instructions Will Be Listed Below (If Applicable).

## 2014-09-27 NOTE — Progress Notes (Signed)
Cardiology Office Note   Date:  09/27/2014   ID:  Steven Guzman, Steven Guzman Feb 16, 1941, MRN [id number]  PCP:  Olga Millers, MD  Cardiologist:  Sinclair Grooms, MD   Chief Complaint  Patient presents with  . Cardiomyopathy      History of Present Illness: Steven Guzman is a 74 y.o. male who presents for ischemic cardiomyopathy, COPD, tobacco abuse, hypertension, ASCVD, CAD without angina, ventricular tachycardia, and chronic systolic heart failure.  Steven Guzman is doing relatively well. He continues to smoke cigarettes. He has dyspnea on exertion. He denies angina. His last AICD discharges in December 2015. He has occasional lower extremity swelling. He denies orthopnea. He has dizziness when he stands. He has not had syncope. He denies palpitations.   Past Medical History  Diagnosis Date  . Hypertension   . Hyperlipidemia   . COPD (chronic obstructive pulmonary disease)     a. followed by pulmonary, COPD GOLD stage II  . GERD (gastroesophageal reflux disease)   . Diverticulitis   . CAD (coronary artery disease)     a. s/p MI in 1994 with PCI to LAD at that time b. cath 10/2012 demonstrated EF 30%, inferior akinesis with mild hypokinesis of all walls, patent LAD and RCA stents; ostial PDA with 80-90% obstruction with medical therapy recommended   . Depression   . Aneurysm     abdominal <4  . Tobacco abuse   . Ventricular tachycardia     a. 08/2009 s/p BSX E110 Teligen 100 AICD, ser#: [id number];  b. 08/2008 VT req ATP - detection reprogrammed from 160 to 150.  [name] Chronic systolic CHF (congestive heart failure)     Past Surgical History  Procedure Laterality Date  . Hernia repair    . Foot surgery      lt  . Hemorrhoid banding    . Retinal detachment surgery      right  . Angioplasty    . Cardiac catheterization    . Nose surgery      skin cancer-mohs-skin graft  . Colonoscopy    . Tenolysis Right 12/21/2013    Procedure: TENOLYSIS FLEXOR CARPI RADIALIS ,DEBRIDEMENT  RIGHT JOINT WRIST,DEBRIDEMENT SCAPHOTRAPEZIAL TRAPEZOID, REPAIR OF EXTENSOR HOOD;  Surgeon: Daryll Brod, MD;  Location: [facility];  Service: Orthopedics;  Laterality: Right;  . Left heart catheterization with coronary angiogram N/A 11/25/2012    demonstrated EF 30%, inferior akinesis with mild hypokinesis of all walls, patent LAD and RCA stents; ostial PDA with 80-90% obstruction with medical therapy recommended  . Implantable cardioverter defibrillator implant  09/06/09    BSX dual chamber ICD     Current Outpatient Prescriptions  Medication Sig Dispense Refill  . amiodarone (PACERONE) 200 MG tablet Take 1 tablet (200 mg total) by mouth daily. 90 tablet 0  . aspirin 81 MG tablet Take 81 mg by mouth daily.    [name] atorvastatin (LIPITOR) 80 MG tablet Take 1 tablet (80 mg total) by mouth daily. 30 tablet 6  . benazepril (LOTENSIN) 10 MG tablet Take 1 tablet (10 mg total) by mouth daily. 30 tablet 1  . budesonide-formoterol (SYMBICORT) 160-4.5 MCG/ACT inhaler Take 2 puffs first thing in am and then another 2 puffs about 12 hours later. (Patient taking differently: Inhale 2 puffs into the lungs first thing in the am and then inhale another 2 puffs into the lungs about 12 hours later.) 1 Inhaler 11  . busPIRone (BUSPAR) 15 MG tablet Take 7.5 mg by mouth  2 (two) times daily. Take one half tablet in the morning and one half tablet in the evening.  0  . carvedilol (COREG) 12.5 MG tablet Take 1 tablet (12.5 mg total) by mouth 2 (two) times daily with a meal. 60 tablet 6  . cetirizine (ZYRTEC) 10 MG tablet Take 10 mg by mouth daily as needed for allergies.    . Choline Fenofibrate (TRILIPIX) 135 MG capsule Take 1 capsule (135 mg total) by mouth daily. 30 capsule 8  . furosemide (LASIX) 40 MG tablet Take 20 mg by mouth daily.     [name] gabapentin (NEURONTIN) 100 MG capsule Take 300 mg by mouth 2 (two) times daily.   4  . Ipratropium-Albuterol (COMBIVENT RESPIMAT) 20-100 MCG/ACT AERS respimat Inhale 1  puff into the lungs 4 (four) times daily. Shortness of breath or wheezing 1 Inhaler 5  . nitroGLYCERIN (NITROSTAT) 0.4 MG SL tablet Place 1 tablet (0.4 mg total) under the tongue every 5 (five) minutes as needed for chest pain. 25 tablet 12  . omeprazole (PRILOSEC) 20 MG capsule Take 20 mg by mouth 2 (two) times daily.    [name] PARoxetine (PAXIL) 40 MG tablet take 1 tablet by mouth every morning 30 tablet 11  . polyethylene glycol (MIRALAX / GLYCOLAX) packet Take 17 g by mouth daily as needed. (CONSTIPATION)  0  . spironolactone (ALDACTONE) 25 MG tablet Take 0.5 tablets (12.5 mg total) by mouth daily. 45 tablet 2  . tamsulosin (FLOMAX) 0.4 MG CAPS capsule take 1 capsule by mouth daily AFTER SUPPER 30 capsule 3  . tiotropium (SPIRIVA) 18 MCG inhalation capsule Place 1 capsule (18 mcg total) into inhaler and inhale daily. 30 capsule 3   No current facility-administered medications for this visit.    Allergies:   Sulfa antibiotics    Social History:  The patient  reports that he has been smoking Cigarettes.  He has a 27.5 pack-year smoking history. He has never used smokeless tobacco. He reports that he drinks alcohol. He reports that he does not use illicit drugs.   Family History:  The patient's family history includes CAD in his father and mother; Heart attack in his brother.    ROS:  Please see the history of present illness.   Otherwise, review of systems are positive for significant/chronic lower back discomfort, dizziness, easy bruising, anxiety, and chronic shortness of breath..   All other systems are reviewed and negative.    PHYSICAL EXAM: VS:  BP 86/64 mmHg  Pulse 61  Ht 5\' 11"  (1.803 m)  Wt 98.431 kg (217 lb)  BMI 30.28 kg/m2  SpO2 98% , BMI Body mass index is 30.28 kg/(m^2). GEN: Well nourished, well developed, in no acute distress HEENT: normal Neck: no JVD, carotid bruits, or masses Cardiac: RRR; no murmurs, rubs, or gallops,no edema  Respiratory:  Bilateral basilar rales ,  normal work of breathing GI: soft, nontender, nondistended, + BS MS: no deformity or atrophy Skin: warm and dry, no rash Neuro:  Strength and sensation are intact Psych: euthymic mood, full affect   EKG:  EKG is not ordered today. The ekg ordered today demonstrates by ECG from earlier this month when he visited the ER with ventricular tachycardia revealed sinus rhythm with interventricular conduction delay. He had pacemaker/AICD terminated ventricular tachycardia. Cardiac markers were negative   Recent Labs: 10/09/2013: Pro B Natriuretic peptide (BNP) 36.0 08/09/2014: ALT 28 09/08/2014: Hemoglobin 12.7*; Platelets 171 09/09/2014: BUN 10; Creatinine, Ser 1.12; Potassium 4.2; Sodium 138  Lipid Panel    Component Value Date/Time   CHOL 150 11/25/2012 0330   TRIG 98 11/25/2012 0330   HDL 43 11/25/2012 0330   CHOLHDL 3.5 11/25/2012 0330   VLDL 20 11/25/2012 0330   LDLCALC 87 11/25/2012 0330      Wt Readings from Last 3 Encounters:  09/27/14 98.431 kg (217 lb)  09/09/14 97 kg (213 lb 13.5 oz)  08/23/14 97.523 kg (215 lb)      Other studies Reviewed: Additional studies/ records that were reviewed today include: . Review of the above records demonstrates:   ASSESSMENT AND PLAN:  1. On amiodarone therapy No side effects or toxicity a supine history and exam. Following tests will be performed: - TSH - Hepatic function panel - DG Chest 2 View; Future  2. Coronary artery disease involving native coronary artery of native heart without angina pectoris Asymptomatic  3. Chronic systolic heart failure No evidence of volume overload  4. Essential hypertension Low normal  5. ICD (implantable cardioverter-defibrillator) in place Appropriate anti-tachycardia pacing earlier this month during ventricular tachycardia  6. Ventricular tachycardia Treated with antitachycardia pacing earlier this month.     Current medicines are reviewed at length with the patient today.  The  patient does not have concerns regarding medicines.  The following changes have been made:  no change  Labs/ tests ordered today include:   Orders Placed This Encounter  Procedures  . DG Chest 2 View  . TSH  . Hepatic function panel     Disposition:   FU with HS in 6 months  Signed, Sinclair Grooms, MD  09/27/2014 4:05 PM    [facility] [place], [place], [place]  [address] Phone: [phone]; Fax: [phone]

## 2014-09-28 LAB — HEPATIC FUNCTION PANEL
ALT: 19 U/L (ref 0–53)
AST: 16 U/L (ref 0–37)
Albumin: 3.9 g/dL (ref 3.5–5.2)
Alkaline Phosphatase: 36 U/L — ABNORMAL LOW (ref 39–117)
BILIRUBIN DIRECT: 0.1 mg/dL (ref 0.0–0.3)
TOTAL PROTEIN: 6.7 g/dL (ref 6.0–8.3)
Total Bilirubin: 0.6 mg/dL (ref 0.2–1.2)

## 2014-09-29 LAB — TSH: TSH: 1.26 u[IU]/mL (ref 0.35–4.50)

## 2014-10-02 ENCOUNTER — Ambulatory Visit (INDEPENDENT_AMBULATORY_CARE_PROVIDER_SITE_OTHER)
Admission: RE | Admit: 2014-10-02 | Discharge: 2014-10-02 | Disposition: A | Discharge status: Home / Self Care | Payer: Medicare Other | Source: Ambulatory Visit | Attending: Pulmonary Disease | Admitting: Pulmonary Disease

## 2014-10-02 DIAGNOSIS — F17210 Nicotine dependence, cigarettes, uncomplicated: Secondary | ICD-10-CM | POA: Diagnosis not present

## 2014-10-02 DIAGNOSIS — Z122 Encounter for screening for malignant neoplasm of respiratory organs: Secondary | ICD-10-CM | POA: Diagnosis not present

## 2014-10-02 DIAGNOSIS — J432 Centrilobular emphysema: Secondary | ICD-10-CM | POA: Diagnosis not present

## 2014-10-02 DIAGNOSIS — Z87891 Personal history of nicotine dependence: Secondary | ICD-10-CM | POA: Diagnosis not present

## 2014-10-03 ENCOUNTER — Telehealth: Payer: Self-pay | Admitting: Acute Care

## 2014-10-03 NOTE — Telephone Encounter (Signed)
I called Steven Guzman, at Dr. Anastasia Pall request, to give him the results of his scan. I explained that Lung RADS 2 indicated that the nodules present have a very low likelihood of becoming a clinically active cancer due to size or lack of growth. He verbalized understanding. I told him he will need another screening CT in 12 months, but that if there was any change in his health, specifically, unintentional weight loss of coughing up blood he needed to be seen immediately by Dr. Lake Bells. He verbalized understanding. He had no further questions. He has the office contact information in the event he has any additional questions in the future.

## 2014-10-05 ENCOUNTER — Other Ambulatory Visit: Payer: Self-pay | Admitting: Pulmonary Disease

## 2014-10-05 DIAGNOSIS — F17210 Nicotine dependence, cigarettes, uncomplicated: Secondary | ICD-10-CM

## 2014-10-05 DIAGNOSIS — Z87891 Personal history of nicotine dependence: Secondary | ICD-10-CM

## 2014-10-08 ENCOUNTER — Ambulatory Visit (INDEPENDENT_AMBULATORY_CARE_PROVIDER_SITE_OTHER): Payer: Medicare Other | Admitting: Internal Medicine

## 2014-10-08 ENCOUNTER — Encounter: Payer: Self-pay | Admitting: Internal Medicine

## 2014-10-08 VITALS — BP 120/56 | HR 64 | Ht 71.0 in | Wt 216.4 lb

## 2014-10-08 DIAGNOSIS — K219 Gastro-esophageal reflux disease without esophagitis: Secondary | ICD-10-CM | POA: Diagnosis not present

## 2014-10-08 DIAGNOSIS — Z86010 Personal history of colonic polyps: Secondary | ICD-10-CM | POA: Diagnosis not present

## 2014-10-08 DIAGNOSIS — I2510 Atherosclerotic heart disease of native coronary artery without angina pectoris: Secondary | ICD-10-CM | POA: Diagnosis not present

## 2014-10-08 DIAGNOSIS — R1011 Right upper quadrant pain: Secondary | ICD-10-CM

## 2014-10-08 MED ORDER — HYOSCYAMINE SULFATE 0.125 MG SL SUBL: 0.1250 mg | SUBLINGUAL_TABLET | SUBLINGUAL | Status: DC | PRN

## 2014-10-08 NOTE — Progress Notes (Addendum)
HISTORY OF PRESENT ILLNESS:  Steven Guzman is a 74 y.o. male with MULTIPLE SIGNIFICANT medical problems as listed below. These include coronary artery disease with ischemic cardiomyopathy (ejection fraction 30-35%), recurrent ventricular tachycardia with ICD in place, chronic tobacco abuse with resultant COPD, hypertension, hyperlipidemia, GERD, and "colon polyps". Outside medical records, x-rays, and laboratories reviewed. He presents today with his wife, Steven Guzman, regarding recurrent right upper quadrant pain and the need for colonoscopy. Patient reports 2 severe episodes of right upper quadrant pain within the last year or so. The pain is focal, sharp, and generally lasts 30-45 minutes. With the first such episode he was [place]. Apparently had negative CT scan. There was no associated nausea, vomiting, worsening shortness of breath, chest pain, other abdominal pain, change in bowel habits, or bleeding. He had a second episode 08/09/2014. He was evaluated at the emergency room in [place]. Noncontrast enhanced CT scan of the abdomen and pelvis revealed no acute changes. Multiple incidental findings. Most recently hospitalized 09/08/2014 with sustained ventricular tachycardia. Review of outside blood work shows stable hemoglobin of 12.7, and mild renal insufficiency with creatinine 1.23. In terms of the right upper quadrant pain, the patient cannot identify any exacerbating, inciting, or relieving factors. Next, he reports chronic GERD. He takes PPI. This controls reflux symptoms. He has no significant dysphagia. He has had upper endoscopy previously and describes what sounds like esophagitis. Finally, he reports having had a colonoscopy about 5 years ago in [place]. He thinks he has had polyps but cannot elaborate. They wonder if he is due for follow-up. He has no lower GI complaints such as change in bowel habits or bleeding.  REVIEW OF SYSTEMS:  All non-GI ROS negative  except for anxiety, back pain, depression, heart rhythm changes, night sweats, shortness of breath, edema  Past Medical History  Diagnosis Date  . Hypertension   . Hyperlipidemia   . COPD (chronic obstructive pulmonary disease)     a. followed by pulmonary, COPD GOLD stage II  . GERD (gastroesophageal reflux disease)   . Diverticulitis   . CAD (coronary artery disease)     a. s/p MI in 1994 with PCI to LAD at that time b. cath 10/2012 demonstrated EF 30%, inferior akinesis with mild hypokinesis of all walls, patent LAD and RCA stents; ostial PDA with 80-90% obstruction with medical therapy recommended   . Depression   . Aneurysm     abdominal <4  . Tobacco abuse   . Ventricular tachycardia     a. 08/2009 s/p BSX E110 Teligen 100 AICD, ser#: [id number];  b. 08/2008 VT req ATP - detection reprogrammed from 160 to 150.  [name] Chronic systolic CHF (congestive heart failure)   . Hiatal hernia     Past Surgical History  Procedure Laterality Date  . Hernia repair    . Foot surgery      lt  . Hemorrhoid banding    . Retinal detachment surgery      right  . Angioplasty    . Cardiac catheterization    . Nose surgery      skin cancer-mohs-skin graft  . Colonoscopy    . Tenolysis Right 12/21/2013    Procedure: TENOLYSIS FLEXOR CARPI RADIALIS ,DEBRIDEMENT RIGHT JOINT WRIST,DEBRIDEMENT SCAPHOTRAPEZIAL TRAPEZOID, REPAIR OF EXTENSOR HOOD;  Surgeon: Daryll Brod, MD;  Location: [facility];  Service: Orthopedics;  Laterality: Right;  . Left heart catheterization with coronary angiogram N/A 11/25/2012    demonstrated EF 30%, inferior  akinesis with mild hypokinesis of all walls, patent LAD and RCA stents; ostial PDA with 80-90% obstruction with medical therapy recommended  . Implantable cardioverter defibrillator implant  09/06/09    BSX dual chamber ICD    Social History Steven Guzman  reports that he has been smoking Cigarettes.  He has a 27.5 pack-year smoking history. He has never used  smokeless tobacco. He reports that he drinks alcohol. He reports that he does not use illicit drugs.  family history includes CAD in his father and mother; Heart attack in his brother.  Allergies  Allergen Reactions  . Sulfa Antibiotics Hives       PHYSICAL EXAMINATION: Vital signs: BP 120/56 mmHg  Pulse 64  Ht 5\' 11"  (1.803 m)  Wt 216 lb 6.4 oz (98.158 kg)  BMI 30.19 kg/m2  Constitutional: Weathered appearing, no acute distress Psychiatric: alert and oriented x3, cooperative Eyes: extraocular movements intact, anicteric, conjunctiva pink Mouth: oral pharynx moist, no lesions Neck: supple no lymphadenopathy Cardiovascular: heart regular rate and rhythm, no murmur Lungs: clear to auscultation bilaterally Abdomen: soft, nontender, nondistended, no obvious ascites, no peritoneal signs, normal bowel sounds, no organomegaly Rectal: Omitted Extremities: no clubbing cyanosis or significant lower extremity edema bilaterally. Multiple ecchymoses on the arms and legs Skin: no lesions on visible extremities. Multiple ecchymoses on the arms and legs Neuro: No focal deficits. No asterixis.   ASSESSMENT:  #1. Intermittent right upper quadrant pain. Rule out gallstones. Rule out musculoskeletal such as rib-catch syndrome. Rule out spasm #2. Chronic GERD. On PPI with no worrisome symptoms #3. Prior colonoscopy with "polyps". Question need for surveillance. The patient is extremely high risk and I would not offer colonoscopy unless overwhelming findings on previous exam #4. Multiple significant medical problems  PLAN:  #1. Abdominal ultrasound to evaluate right upper quadrant pain and rule out gallstones #2. Prescribed Levsin sublingual 0.125 mg. One to 2 every 4 hours as needed for pain #3. Continue PPI #4. Patient to obtain old colonoscopy reports for review. Can decide thereafter if high-risk colonoscopy warranted #5. Interval GI follow-up as needed  ADDENDUM (01/22/2015)  Outside  pathology report dated 08/08/2010 reveals diminutive adenomas and hyperplastic polyps. No colonoscopy report. No routine colonoscopy planned given minimal pathologic findings, age, and comorbidities.

## 2014-10-08 NOTE — Patient Instructions (Signed)
You have been scheduled for an abdominal ultrasound at [facility] (1st floor of hospital) on 10/12/2014 at 9:30am. Please arrive 15 minutes prior to your appointment for registration. Make certain not to have anything to eat or drink 6 hours prior to your appointment. Should you need to reschedule your appointment, please contact radiology at [phone]. This test typically takes about 30 minutes to perform.  We have sent the following medications to your pharmacy for you to pick up at your convenience:  Levsin

## 2014-10-12 ENCOUNTER — Telehealth: Payer: Self-pay | Admitting: Internal Medicine

## 2014-10-12 ENCOUNTER — Other Ambulatory Visit: Payer: Self-pay | Admitting: Internal Medicine

## 2014-10-12 ENCOUNTER — Ambulatory Visit (HOSPITAL_COMMUNITY)
Admission: RE | Admit: 2014-10-12 | Discharge: 2014-10-12 | Disposition: A | Discharge status: Home / Self Care | Payer: Medicare Other | Source: Ambulatory Visit | Attending: Internal Medicine | Admitting: Internal Medicine

## 2014-10-12 DIAGNOSIS — R1011 Right upper quadrant pain: Secondary | ICD-10-CM

## 2014-10-12 DIAGNOSIS — K219 Gastro-esophageal reflux disease without esophagitis: Secondary | ICD-10-CM

## 2014-10-12 NOTE — Telephone Encounter (Signed)
Left message for pt to call back  °

## 2014-10-12 NOTE — Telephone Encounter (Signed)
Spoke with pts husband and he got the appt times mixed up. Pt brought his previous colon report in for Dr. Henrene Pastor to review, report placed in Dr. Blanch Media box. Pt wants to know if he needs to have the ultrasound now since he brought the colon report in. Please advise.

## 2014-10-13 ENCOUNTER — Encounter: Payer: Self-pay | Admitting: Nurse Practitioner

## 2014-10-13 NOTE — Progress Notes (Signed)
Electrophysiology Office Note Date: 10/15/2014  ID:  Steven Guzman, Steven Guzman March 12, 1941, MRN [id number]  PCP: Olga Millers, MD Primary Cardiologist: Tamala Julian Electrophysiologist: Lovena Le  CC: VT follow up  Steven Guzman is a 74 y.o. male seen today for Dr Lovena Le.  He underwent dual chamber ICD implantation in 2011 while living in [place] at the time of cardiac arrest 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.  He has been maintained on amiodarone for several years and has had increasing VT burden recently.  He had ICD shocks in December of 2015 when he was off of his medications.  He then presented to the hospital last month with VT below his detection and his ICD was reprogrammed.  He presents today for follow up.   Since discharge, the patient reports doing reasonably well.  He denies chest pain, palpitations, dyspnea, PND, orthopnea, nausea, vomiting, syncope, edema, weight gain, or early satiety.  He has not had ICD shocks.  He reports stable orthostatic intolerance. He also has had RUQ abdominal pain that resolves with Pepto-Bismol.  Device History: BSX dual chamber ICD implanted 2011 for secondary prevention History of appropriate therapy: yes History of AAD therapy: yes - amiodarone   Past Medical History  Diagnosis Date  . Hypertension   . Hyperlipidemia   . COPD (chronic obstructive pulmonary disease)     a. followed by pulmonary, COPD GOLD stage II  . GERD (gastroesophageal reflux disease)   . Diverticulitis   . CAD (coronary artery disease)     a. s/p MI in 1994 with PCI to LAD at that time b. cath 10/2012 demonstrated EF 30%, inferior akinesis with mild hypokinesis of all walls, patent LAD and RCA stents; ostial PDA with 80-90% obstruction with medical therapy recommended   . Depression   . Aneurysm     abdominal <4  . Tobacco abuse   . Ventricular tachycardia     a. 08/2009 s/p BSX E110 Teligen 100 AICD, ser#: [id number];  b. 08/2008 VT req ATP - detection reprogrammed from 160 to 150.    [name] Chronic systolic CHF (congestive heart failure)   . Hiatal hernia    Past Surgical History  Procedure Laterality Date  . Hernia repair    . Foot surgery      lt  . Hemorrhoid banding    . Retinal detachment surgery      right  . Nose surgery      skin cancer-mohs-skin graft  . Colonoscopy    . Tenolysis Right 12/21/2013    Procedure: TENOLYSIS FLEXOR CARPI RADIALIS ,DEBRIDEMENT RIGHT JOINT WRIST,DEBRIDEMENT SCAPHOTRAPEZIAL TRAPEZOID, REPAIR OF EXTENSOR HOOD;  Surgeon: Daryll Brod, MD;  Location: [facility];  Service: Orthopedics;  Laterality: Right;  . Left heart catheterization with coronary angiogram N/A 11/25/2012    demonstrated EF 30%, inferior akinesis with mild hypokinesis of all walls, patent LAD and RCA stents; ostial PDA with 80-90% obstruction with medical therapy recommended  . Implantable cardioverter defibrillator implant  09/06/09    BSX dual chamber ICD implanted in [place] for cardiac arrest 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    Current Outpatient Prescriptions  Medication Sig Dispense Refill  . amiodarone (PACERONE) 200 MG tablet take 1 tablet by mouth once daily 90 tablet 0  . aspirin 81 MG tablet Take 81 mg by mouth daily.    [name] atorvastatin (LIPITOR) 80 MG tablet Take 1 tablet (80 mg total) by mouth daily. 30 tablet 6  . benazepril (LOTENSIN) 10 MG tablet Take  1 tablet (10 mg total) by mouth daily. 30 tablet 1  . budesonide-formoterol (SYMBICORT) 160-4.5 MCG/ACT inhaler Take 2 puffs first thing in am and then another 2 puffs about 12 hours later. (Patient taking differently: Inhale 2 puffs into the lungs first thing in the am and then inhale another 2 puffs into the lungs about 12 hours later.) 1 Inhaler 11  . busPIRone (BUSPAR) 15 MG tablet Take 7.5 mg by mouth 2 (two) times daily. Take one half tablet in the morning and one half tablet in the evening.  0  . carvedilol (COREG) 12.5 MG tablet Take 1 tablet (12.5 mg total) by mouth 2 (two) times daily with a  meal. 60 tablet 6  . cetirizine (ZYRTEC) 10 MG tablet Take 10 mg by mouth daily as needed for allergies.    . Choline Fenofibrate (TRILIPIX) 135 MG capsule Take 1 capsule (135 mg total) by mouth daily. 30 capsule 8  . furosemide (LASIX) 40 MG tablet Take 20 mg by mouth daily.     [name] gabapentin (NEURONTIN) 100 MG capsule Take 300 mg by mouth 2 (two) times daily.   4  . hyoscyamine (LEVSIN SL) 0.125 MG SL tablet Place 1 tablet (0.125 mg total) under the tongue every 4 (four) hours as needed. 30 tablet 3  . Ipratropium-Albuterol (COMBIVENT RESPIMAT) 20-100 MCG/ACT AERS respimat Inhale 1 puff into the lungs 4 (four) times daily. Shortness of breath or wheezing 1 Inhaler 5  . nitroGLYCERIN (NITROSTAT) 0.4 MG SL tablet Place 1 tablet (0.4 mg total) under the tongue every 5 (five) minutes as needed for chest pain. 25 tablet 12  . omeprazole (PRILOSEC) 20 MG capsule Take 20 mg by mouth 2 (two) times daily.    [name] PARoxetine (PAXIL) 40 MG tablet take 1 tablet by mouth every morning 30 tablet 11  . polyethylene glycol (MIRALAX / GLYCOLAX) packet Take 17 g by mouth daily as needed. (CONSTIPATION)  0  . spironolactone (ALDACTONE) 25 MG tablet Take 0.5 tablets (12.5 mg total) by mouth daily. 45 tablet 2  . tamsulosin (FLOMAX) 0.4 MG CAPS capsule take 1 capsule by mouth daily AFTER SUPPER 30 capsule 3  . tiotropium (SPIRIVA) 18 MCG inhalation capsule Place 1 capsule (18 mcg total) into inhaler and inhale daily. 30 capsule 3   No current facility-administered medications for this visit.    Allergies:   Sulfa antibiotics   Social History: History   Social History  . Marital Status: Married    Spouse Name: N/A  . Number of Children: N/A  . Years of Education: N/A   Occupational History  . Retired    Social History Main Topics  . Smoking status: Current Every Day Smoker -- 0.50 packs/day for 55 years    Types: Cigarettes  . Smokeless tobacco: Never Used     Comment: attending the "stop smoking"  classes through [employer]  . Alcohol Use: 0.0 oz/week    0 Standard drinks or equivalent per week     Comment: occasionally   . Drug Use: No  . Sexual Activity: Not Currently   Other Topics Concern  . Not on file   Social History Narrative    Family History: Family History  Problem Relation Age of Onset  . Heart attack Brother   . CAD Father   . CAD Mother   . Hypertension Mother   . Hypertension Father   . Hypertension Brother   . Stroke Neg Hx     Review of Systems: All  other systems reviewed and are otherwise negative except as noted above.   Physical Exam: VS:  BP 100/60 mmHg  Pulse 64  Ht 5\' 11"  (1.803 m)  Wt 219 lb (99.338 kg)  BMI 30.56 kg/m2 , BMI Body mass index is 30.56 kg/(m^2).  GEN- The patient is elderly appearing, alert and oriented x 3 today, +tobacco smell HEENT: normocephalic, atraumatic; sclera clear, conjunctiva pink; hearing intact; oropharynx clear  Lungs- Clear to ausculation bilaterally, normal work of breathing.  No wheezes, rales, rhonchi Heart- Regular rate and rhythm, no murmurs, rubs or gallops  GI- obese, non-tender, non-distended, bowel sounds present  Extremities- no clubbing, cyanosis, or edema; DP/PT/radial pulses 1+ bilaterally MS- no significant deformity or atrophy Skin- warm and dry, no rash or lesion; ICD pocket well healed, +scattered ecchymosis Psych- euthymic mood, full affect Neuro- strength and sensation are intact  ICD interrogation- reviewed in detail today,  See PACEART report  EKG:  EKG is not  ordered today.  Recent Labs: 09/08/2014: Hemoglobin 12.7*; Platelets 171 09/09/2014: BUN 10; Creatinine, Ser 1.12; Potassium 4.2; Sodium 138 09/27/2014: ALT 19; TSH 1.26   Wt Readings from Last 3 Encounters:  10/15/14 219 lb (99.338 kg)  10/08/14 216 lb 6.4 oz (98.158 kg)  09/27/14 217 lb (98.431 kg)     Other studies Reviewed: Additional studies/ records that were reviewed today include:Dr [place] office notes, hospital  records  Assessment and Plan:  1.  Ventricular tachycardia No recurrence since hospitalization 08/2014 Continue Amiodarone 200mg  daily (recent TFT's, LFT's normal, eye exam recommended, PFT's done by Dr Pennie Banter per patient) No driving x6 months from last VT episode - pt aware If recurrent VT, would recommend VT ablation - discussed with patient today - VT cycle length 319msec  2.  Chronic systolic dysfunction euvolemic today Stable on an appropriate medical regimen Normal ICD function See Pace Art report  3.  CAD/ICM No recent ischemic symptoms Continue medical therapy  4.  HTN Stable No change required today  5.  Tobacco abuse Cessation advised  He is contemplating trying Chantix    Current medicines are reviewed at length with the patient today.   The patient does not have concerns regarding his medicines.  The following changes were made today:  none  Labs/ tests ordered today include: none   Disposition:   Follow up with Dr Lovena Le 3 months   Signed, Chanetta Marshall, NP 10/15/2014 8:16 AM  [facility] [place] [address] [phone] (office) [phone] (fax)

## 2014-10-15 ENCOUNTER — Other Ambulatory Visit: Payer: Self-pay

## 2014-10-15 ENCOUNTER — Encounter: Payer: Self-pay | Admitting: Nurse Practitioner

## 2014-10-15 ENCOUNTER — Ambulatory Visit (INDEPENDENT_AMBULATORY_CARE_PROVIDER_SITE_OTHER): Payer: Medicare Other | Admitting: Nurse Practitioner

## 2014-10-15 ENCOUNTER — Telehealth: Payer: Self-pay | Admitting: Internal Medicine

## 2014-10-15 VITALS — BP 100/60 | HR 64 | Ht 71.0 in | Wt 219.0 lb

## 2014-10-15 DIAGNOSIS — I5022 Chronic systolic (congestive) heart failure: Secondary | ICD-10-CM | POA: Diagnosis not present

## 2014-10-15 DIAGNOSIS — I255 Ischemic cardiomyopathy: Secondary | ICD-10-CM | POA: Diagnosis not present

## 2014-10-15 DIAGNOSIS — I10 Essential (primary) hypertension: Secondary | ICD-10-CM | POA: Diagnosis not present

## 2014-10-15 DIAGNOSIS — I472 Ventricular tachycardia, unspecified: Secondary | ICD-10-CM

## 2014-10-15 DIAGNOSIS — R1084 Generalized abdominal pain: Secondary | ICD-10-CM

## 2014-10-15 LAB — CUP PACEART INCLINIC DEVICE CHECK
Date Time Interrogation Session: 20160718083817
Pulse Gen Serial Number: 164892

## 2014-10-15 NOTE — Patient Instructions (Signed)
Medication Instructions:   Your physician recommends that you continue on your current medications as directed. Please refer to the Current Medication list given to you today.    Labwork:  NONE TODAY    Testing/Procedures:  NONE TODAY    Follow-Up:   WITH DR Lovena Le IN 3 MONTHS    Any Other Special Instructions Will Be Listed Below (If Applicable).

## 2014-10-15 NOTE — Telephone Encounter (Signed)
Pts [place] rescheduled to 10/19/14@[facility] @7 :30am. Pt to arrive there at 7:15am. Pt to be NPO after midnight. Pt aware of appt.

## 2014-10-18 ENCOUNTER — Encounter: Payer: Self-pay | Admitting: Pulmonary Disease

## 2014-10-18 ENCOUNTER — Ambulatory Visit (INDEPENDENT_AMBULATORY_CARE_PROVIDER_SITE_OTHER): Payer: Medicare Other | Admitting: Pulmonary Disease

## 2014-10-18 VITALS — BP 108/58 | HR 72 | Temp 98.0°F | Ht 71.0 in | Wt 214.0 lb

## 2014-10-18 DIAGNOSIS — J449 Chronic obstructive pulmonary disease, unspecified: Secondary | ICD-10-CM

## 2014-10-18 DIAGNOSIS — I255 Ischemic cardiomyopathy: Secondary | ICD-10-CM

## 2014-10-18 DIAGNOSIS — F17210 Nicotine dependence, cigarettes, uncomplicated: Secondary | ICD-10-CM

## 2014-10-18 DIAGNOSIS — Z720 Tobacco use: Secondary | ICD-10-CM | POA: Diagnosis not present

## 2014-10-18 MED ORDER — VARENICLINE TARTRATE 0.5 MG X 11 & 1 MG X 42 PO MISC: ORAL | Status: DC

## 2014-10-18 NOTE — Assessment & Plan Note (Signed)
I have personally reviewed his CT chest which showed no worrisome findings. He will have a repeat CT chest in one year for screening purposes.  Today we had a lengthy conversation regarding his smoking and he once to take Chantix after thinking about it for quite some time. He has struggled with depression in the past and that was his initial hesitancy. However, he has been stable from a mental health standpoint, is jovial and laughing with me today in clinic. I explained to him the risk of suicidal thoughts as well as mood disorder, he understands this and is still willing to proceed. He tells her that he will make sure that his wifetaking the medication.  Plan: Repeat CT chest in one year Start Chantix.

## 2014-10-18 NOTE — Progress Notes (Signed)
Subjective:    Patient ID: Steven Guzman, male    DOB: 1940/11/06, 74 y.o.   MRN: [id number]  Synopsis: GOLD Grade C COPD, still actively smoking as of November 2015 10/2013 PFT> ratio 61%, FEV1 2.53L (77% pred, 15% change), TLC 8.65 L (117% pred), RV 4.25L (161% pred), DLCO 25.39 (73% pred)  HPI Chief Complaint  Patient presents with  . Follow-up    pt states he is at baseline today.  CAT score 17.   [place] has been OK.  He has still been smoking lately.  He has not been very active.  He says that he doesn't exercise much.   He is still taking both inhalers regularly and feels that they really help.   He typically takes Spiriva right before bedtime.  Last night he forgot ad he could tell a difference this morning.  He really wants to quit smoking and wants to try Chantix.   Past Medical History  Diagnosis Date  . Hypertension   . Hyperlipidemia   . COPD (chronic obstructive pulmonary disease)     a. followed by pulmonary, COPD GOLD stage II  . GERD (gastroesophageal reflux disease)   . Diverticulitis   . CAD (coronary artery disease)     a. s/p MI in 1994 with PCI to LAD at that time b. cath 10/2012 demonstrated EF 30%, inferior akinesis with mild hypokinesis of all walls, patent LAD and RCA stents; ostial PDA with 80-90% obstruction with medical therapy recommended   . Depression   . Aneurysm     abdominal <4  . Tobacco abuse   . Ventricular tachycardia     a. 08/2009 s/p BSX E110 Teligen 100 AICD, ser#: [id number];  b. 08/2008 VT req ATP - detection reprogrammed from 160 to 150.  [name] Chronic systolic CHF (congestive heart failure)   . Hiatal hernia      Review of Systems  Constitutional: Negative for fever, chills and fatigue.  HENT: Negative for postnasal drip, rhinorrhea and sinus pressure.   Respiratory: Negative for cough, shortness of breath and wheezing.   Cardiovascular: Negative for chest pain, palpitations and leg swelling.       Objective:   Physical  Exam Filed Vitals:   10/18/14 1442  BP: 108/58  Pulse: 72  Temp: 98 F (36.7 C)  TempSrc: Oral  Height: 5\' 11"  (1.803 m)  Weight: 214 lb (97.07 kg)  SpO2: 96%   RA  Gen: well appearing, no acute distress HEENT: NCAT, EOMi, OP clear PULM: mild wheezing bilat, good air movement CV: RRR, no mgr, no JVD AB: BS+, soft, nontender Ext: warm, no edema, no clubbing, no cyanosis Derm: no rash or skin breakdown Neuro: A&Ox4, MAEW  December 2015 CT chest > images reviewed in clinic,There is a necrotizing pneumonia and left upper lobe with surrounding groundglass and central gas     Assessment & Plan:   COPD GOLD GRADE C This has been a very stable interval for him and he has not had an exacerbation.  He has severe symptoms (GOLD C) Immunizations are up to date.  Plan: Continue current regimen  Cigarette smoker I have personally reviewed his CT chest which showed no worrisome findings. He will have a repeat CT chest in one year for screening purposes.  Today we had a lengthy conversation regarding his smoking and he once to take Chantix after thinking about it for quite some time. He has struggled with depression in the past and that was his initial hesitancy.  However, he has been stable from a mental health standpoint, is jovial and laughing with me today in clinic. I explained to him the risk of suicidal thoughts as well as mood disorder, he understands this and is still willing to proceed. He tells her that he will make sure that his wifetaking the medication.  Plan: Repeat CT chest in one year Start Chantix.    Updated Medication List Outpatient Encounter Prescriptions as of 10/18/2014  Medication Sig  . amiodarone (PACERONE) 200 MG tablet take 1 tablet by mouth once daily  . aspirin 81 MG tablet Take 81 mg by mouth daily.  [name] atorvastatin (LIPITOR) 80 MG tablet Take 1 tablet (80 mg total) by mouth daily.  . benazepril (LOTENSIN) 10 MG tablet Take 1 tablet (10 mg total) by  mouth daily.  . budesonide-formoterol (SYMBICORT) 160-4.5 MCG/ACT inhaler Take 2 puffs first thing in am and then another 2 puffs about 12 hours later. (Patient taking differently: Inhale 2 puffs into the lungs first thing in the am and then inhale another 2 puffs into the lungs about 12 hours later.)  . busPIRone (BUSPAR) 15 MG tablet Take 7.5 mg by mouth 2 (two) times daily. Take one half tablet in the morning and one half tablet in the evening.  . carvedilol (COREG) 12.5 MG tablet Take 1 tablet (12.5 mg total) by mouth 2 (two) times daily with a meal.  . cetirizine (ZYRTEC) 10 MG tablet Take 10 mg by mouth daily as needed for allergies.  . Choline Fenofibrate (TRILIPIX) 135 MG capsule Take 1 capsule (135 mg total) by mouth daily.  . furosemide (LASIX) 40 MG tablet Take 20 mg by mouth daily.   [name] gabapentin (NEURONTIN) 100 MG capsule Take 300 mg by mouth 2 (two) times daily.   . hyoscyamine (LEVSIN SL) 0.125 MG SL tablet Place 1 tablet (0.125 mg total) under the tongue every 4 (four) hours as needed.  . Ipratropium-Albuterol (COMBIVENT RESPIMAT) 20-100 MCG/ACT AERS respimat Inhale 1 puff into the lungs 4 (four) times daily. Shortness of breath or wheezing  . nitroGLYCERIN (NITROSTAT) 0.4 MG SL tablet Place 1 tablet (0.4 mg total) under the tongue every 5 (five) minutes as needed for chest pain.  [name] omeprazole (PRILOSEC) 20 MG capsule Take 20 mg by mouth 2 (two) times daily.  [name] PARoxetine (PAXIL) 40 MG tablet take 1 tablet by mouth every morning  . polyethylene glycol (MIRALAX / GLYCOLAX) packet Take 17 g by mouth daily as needed. (CONSTIPATION)  . spironolactone (ALDACTONE) 25 MG tablet Take 0.5 tablets (12.5 mg total) by mouth daily.  . tamsulosin (FLOMAX) 0.4 MG CAPS capsule take 1 capsule by mouth daily AFTER SUPPER  . tiotropium (SPIRIVA) 18 MCG inhalation capsule Place 1 capsule (18 mcg total) into inhaler and inhale daily.  . varenicline (CHANTIX PAK) 0.5 MG X 11 & 1 MG X 42 tablet Take one  0.5 mg tablet by mouth once daily for 3 days, then increase to one 0.5 mg tablet twice daily for 4 days, then increase to one 1 mg tablet twice daily.   No facility-administered encounter medications on file as of 10/18/2014.

## 2014-10-18 NOTE — Assessment & Plan Note (Signed)
This has been a very stable interval for him and he has not had an exacerbation.  He has severe symptoms (GOLD C) Immunizations are up to date.  Plan: Continue current regimen

## 2014-10-18 NOTE — Patient Instructions (Signed)
stop smoking Keep taking your medications as you're doing Take the Chantix as prescribed, watch out for abnormal thoughts or a depressed mood while taking this. If he had the symptoms and stopped taking the medication right away and let me know We'll see back in 6 months or sooner if needed

## 2014-10-19 ENCOUNTER — Telehealth: Payer: Self-pay | Admitting: Pulmonary Disease

## 2014-10-19 ENCOUNTER — Ambulatory Visit (HOSPITAL_COMMUNITY)
Admission: RE | Admit: 2014-10-19 | Discharge: 2014-10-19 | Disposition: A | Discharge status: Home / Self Care | Payer: Medicare Other | Source: Ambulatory Visit | Attending: Internal Medicine | Admitting: Internal Medicine

## 2014-10-19 DIAGNOSIS — I714 Abdominal aortic aneurysm, without rupture: Secondary | ICD-10-CM | POA: Insufficient documentation

## 2014-10-19 DIAGNOSIS — K7689 Other specified diseases of liver: Secondary | ICD-10-CM | POA: Insufficient documentation

## 2014-10-19 DIAGNOSIS — R1011 Right upper quadrant pain: Secondary | ICD-10-CM | POA: Diagnosis present

## 2014-10-19 DIAGNOSIS — R1084 Generalized abdominal pain: Secondary | ICD-10-CM

## 2014-10-19 NOTE — Telephone Encounter (Signed)
I have not received anything on pt.

## 2014-10-19 NOTE — Telephone Encounter (Signed)
OK, but he needs to be able to take 0.5mg  po daily for three days, then 0.5mg  po bid for four days first. If he can split the tablets then I am OK making the change, otherwise we will need to Rx the starter pack

## 2014-10-19 NOTE — Telephone Encounter (Signed)
Pharmacy called because the starter pack of Chantix had 3 refills on it, they do not refill a Starter pack, pharmacy requesting to change order to continuous pack for 12 weeks instead of 16 weeks.  Pharmacy says that the recommendations are 12 weeks.  Ok to change to 12 weeks, continuous packs?  BQ - please advise.

## 2014-10-19 NOTE — Telephone Encounter (Signed)
PA submitted OTP PA to [facility]. Pt may need documentation of lab to support authorization. Pending Decision.

## 2014-10-19 NOTE — Telephone Encounter (Signed)
Spoke with pharmacist, gave instructions per Dr. Anastasia Pall note below. Pharmacist says that she sent a PA request for the Chantix to Dr. Ronda Fairly or Davy Pique - have you received this?

## 2014-10-22 NOTE — Telephone Encounter (Signed)
Chantix has been denied per health plan pt must ALL OF THE FOLLOWING: Try Habitrol OTC, Nicoderm OTC, Nicorette gum OTC, Nicorette lozenges OTC, Thrive gum, Thrive lozenge OTC,  Hx of failure of Bupropion. [id number]

## 2014-10-22 NOTE — Telephone Encounter (Signed)
I have not

## 2014-10-22 NOTE — Telephone Encounter (Signed)
Steven Guzman - have you received decision for this yet?   Please advise.

## 2014-10-23 NOTE — Telephone Encounter (Signed)
Dr. Lake Bells please advise if you have a preference of the below smoking cessation aids, or if you'd like to proceed with an appeal.  Thanks!

## 2014-10-25 NOTE — Telephone Encounter (Signed)
lmtcb for pt.  

## 2014-10-25 NOTE — Telephone Encounter (Signed)
Have him try nicoderm cq 84mcg/day

## 2014-10-26 NOTE — Telephone Encounter (Signed)
Lets do it

## 2014-10-26 NOTE — Telephone Encounter (Signed)
Sonya please advise if you still have the appeal paperwork, or if this needs to be re-requested.  Thanks!

## 2014-10-26 NOTE — Telephone Encounter (Signed)
lmtcb for pt.  

## 2014-10-26 NOTE — Telephone Encounter (Signed)
Spoke with the pt  He states that he has tried the patch, lozenges and gum before and this did not help  He is interested in doing appeal  Would you be willing to do this? Please advise thanks

## 2014-10-26 NOTE — Telephone Encounter (Signed)
[phone], pt cb

## 2014-10-29 ENCOUNTER — Other Ambulatory Visit: Payer: Self-pay

## 2014-10-29 DIAGNOSIS — T8859XA Other complications of anesthesia, initial encounter: Secondary | ICD-10-CM

## 2014-10-29 HISTORY — DX: Other complications of anesthesia, initial encounter: T88.59XA

## 2014-10-29 MED ORDER — CARVEDILOL 12.5 MG PO TABS: 12.5000 mg | ORAL_TABLET | Freq: Two times a day (BID) | ORAL | Status: DC

## 2014-10-29 MED ORDER — BENAZEPRIL HCL 10 MG PO TABS: 10.0000 mg | ORAL_TABLET | Freq: Every day | ORAL | Status: DC

## 2014-10-29 NOTE — Telephone Encounter (Signed)
Belva Crome, MD at 09/27/2014 3:43 PM  benazepril (LOTENSIN) 10 MG tablet Take 1 tablet (10 mg total) by mouth daily         Current medicines are reviewed at length with the patient today. The patient does not have concerns regarding medicines.  The following changes have been made: no change

## 2014-10-29 NOTE — Telephone Encounter (Signed)
Belva Crome, MD at 09/27/2014 3:43 PM  carvedilol (COREG) 12.5 MG tablet Take 1 tablet (12.5 mg total) by mouth 2 (two) times daily with a meal         Current medicines are reviewed at length with the patient today. The patient does not have concerns regarding medicines.  The following changes have been made: no change

## 2014-10-30 NOTE — Telephone Encounter (Signed)
I do not have the documentation. I thought it was faxed to [facility] office for signature?

## 2014-10-31 NOTE — Telephone Encounter (Signed)
Called pharmacy and left voicemail after 3 unsucessful attempts to contact a pharmacist directly to have this pa re-faxed to our office.  Will await fax

## 2014-10-31 NOTE — Telephone Encounter (Signed)
PA has been resubmitted for denial and or next step in process [id number] Awaiting decision

## 2014-10-31 NOTE — Telephone Encounter (Signed)
Looked in [place] folder and there are no documents there for this patient.

## 2014-11-06 NOTE — Telephone Encounter (Signed)
Checked [email or web address]  Determination not available yet.  Can take up to 5 business days from initiation to receive.  PA started on October 31, 2014 Can follow up via web or phone # [phone] Will send to Caryl Pina to follow up

## 2014-11-07 ENCOUNTER — Encounter: Payer: Self-pay | Admitting: Internal Medicine

## 2014-11-08 NOTE — Telephone Encounter (Signed)
Checked status via CoverMyMeds, no determination yet made.  Will continue to follow up.

## 2014-11-12 NOTE — Telephone Encounter (Signed)
PA for Chantix received, covered through 10/31/2015.  Called pharmacy it was originally filled to make aware.   atc pt's cell #, went straight to VM, mailbox full.   Wcb.

## 2014-11-12 NOTE — Telephone Encounter (Signed)
Checked [email or web address] Determination not available yet. PA Started on October 31, 2014  To Caryl Pina for follow up

## 2014-11-13 NOTE — Telephone Encounter (Signed)
Called home # and LMTCB x1 Called cell # and VM full WCB

## 2014-11-14 ENCOUNTER — Emergency Department (HOSPITAL_COMMUNITY): Payer: Medicare Other

## 2014-11-14 ENCOUNTER — Inpatient Hospital Stay (HOSPITAL_COMMUNITY)
Admission: EM | Admit: 2014-11-14 | Discharge: 2014-11-19 | DRG: 438 | Disposition: A | Discharge status: Home / Self Care | Payer: Medicare Other | Attending: Internal Medicine | Admitting: Internal Medicine

## 2014-11-14 ENCOUNTER — Encounter (HOSPITAL_COMMUNITY): Payer: Self-pay | Admitting: Emergency Medicine

## 2014-11-14 DIAGNOSIS — K851 Biliary acute pancreatitis without necrosis or infection: Secondary | ICD-10-CM

## 2014-11-14 DIAGNOSIS — Z881 Allergy status to other antibiotic agents status: Secondary | ICD-10-CM

## 2014-11-14 DIAGNOSIS — I10 Essential (primary) hypertension: Secondary | ICD-10-CM | POA: Diagnosis not present

## 2014-11-14 DIAGNOSIS — K859 Acute pancreatitis without necrosis or infection, unspecified: Secondary | ICD-10-CM

## 2014-11-14 DIAGNOSIS — F419 Anxiety disorder, unspecified: Secondary | ICD-10-CM | POA: Diagnosis present

## 2014-11-14 DIAGNOSIS — Z79899 Other long term (current) drug therapy: Secondary | ICD-10-CM

## 2014-11-14 DIAGNOSIS — K8050 Calculus of bile duct without cholangitis or cholecystitis without obstruction: Secondary | ICD-10-CM | POA: Diagnosis not present

## 2014-11-14 DIAGNOSIS — K838 Other specified diseases of biliary tract: Secondary | ICD-10-CM | POA: Diagnosis not present

## 2014-11-14 DIAGNOSIS — K429 Umbilical hernia without obstruction or gangrene: Secondary | ICD-10-CM | POA: Diagnosis not present

## 2014-11-14 DIAGNOSIS — I255 Ischemic cardiomyopathy: Secondary | ICD-10-CM | POA: Diagnosis present

## 2014-11-14 DIAGNOSIS — I472 Ventricular tachycardia, unspecified: Secondary | ICD-10-CM | POA: Insufficient documentation

## 2014-11-14 DIAGNOSIS — F17210 Nicotine dependence, cigarettes, uncomplicated: Secondary | ICD-10-CM | POA: Diagnosis present

## 2014-11-14 DIAGNOSIS — K219 Gastro-esophageal reflux disease without esophagitis: Secondary | ICD-10-CM | POA: Diagnosis present

## 2014-11-14 DIAGNOSIS — I5023 Acute on chronic systolic (congestive) heart failure: Secondary | ICD-10-CM | POA: Diagnosis present

## 2014-11-14 DIAGNOSIS — I5043 Acute on chronic combined systolic (congestive) and diastolic (congestive) heart failure: Secondary | ICD-10-CM

## 2014-11-14 DIAGNOSIS — K8020 Calculus of gallbladder without cholecystitis without obstruction: Secondary | ICD-10-CM

## 2014-11-14 DIAGNOSIS — I714 Abdominal aortic aneurysm, without rupture: Secondary | ICD-10-CM | POA: Diagnosis present

## 2014-11-14 DIAGNOSIS — I252 Old myocardial infarction: Secondary | ICD-10-CM

## 2014-11-14 DIAGNOSIS — Z8249 Family history of ischemic heart disease and other diseases of the circulatory system: Secondary | ICD-10-CM

## 2014-11-14 DIAGNOSIS — D3501 Benign neoplasm of right adrenal gland: Secondary | ICD-10-CM | POA: Diagnosis not present

## 2014-11-14 DIAGNOSIS — I5022 Chronic systolic (congestive) heart failure: Secondary | ICD-10-CM | POA: Diagnosis present

## 2014-11-14 DIAGNOSIS — I739 Peripheral vascular disease, unspecified: Secondary | ICD-10-CM | POA: Diagnosis present

## 2014-11-14 DIAGNOSIS — I2510 Atherosclerotic heart disease of native coronary artery without angina pectoris: Secondary | ICD-10-CM | POA: Diagnosis present

## 2014-11-14 DIAGNOSIS — N179 Acute kidney failure, unspecified: Secondary | ICD-10-CM | POA: Diagnosis not present

## 2014-11-14 DIAGNOSIS — Z955 Presence of coronary angioplasty implant and graft: Secondary | ICD-10-CM

## 2014-11-14 DIAGNOSIS — D3502 Benign neoplasm of left adrenal gland: Secondary | ICD-10-CM | POA: Diagnosis not present

## 2014-11-14 DIAGNOSIS — Z7982 Long term (current) use of aspirin: Secondary | ICD-10-CM

## 2014-11-14 DIAGNOSIS — R1011 Right upper quadrant pain: Secondary | ICD-10-CM | POA: Diagnosis not present

## 2014-11-14 DIAGNOSIS — F329 Major depressive disorder, single episode, unspecified: Secondary | ICD-10-CM | POA: Diagnosis present

## 2014-11-14 DIAGNOSIS — Z95810 Presence of automatic (implantable) cardiac defibrillator: Secondary | ICD-10-CM

## 2014-11-14 DIAGNOSIS — E785 Hyperlipidemia, unspecified: Secondary | ICD-10-CM | POA: Diagnosis present

## 2014-11-14 DIAGNOSIS — R739 Hyperglycemia, unspecified: Secondary | ICD-10-CM | POA: Diagnosis present

## 2014-11-14 DIAGNOSIS — J449 Chronic obstructive pulmonary disease, unspecified: Secondary | ICD-10-CM | POA: Diagnosis present

## 2014-11-14 DIAGNOSIS — Z85828 Personal history of other malignant neoplasm of skin: Secondary | ICD-10-CM

## 2014-11-14 HISTORY — DX: Diverticulosis of large intestine without perforation or abscess without bleeding: K57.30

## 2014-11-14 HISTORY — DX: Hyperglycemia, unspecified: R73.9

## 2014-11-14 HISTORY — DX: Benign prostatic hyperplasia without lower urinary tract symptoms: N40.0

## 2014-11-14 LAB — URINALYSIS, ROUTINE W REFLEX MICROSCOPIC
GLUCOSE, UA: NEGATIVE mg/dL
HGB URINE DIPSTICK: NEGATIVE
KETONES UR: 15 mg/dL — AB
Nitrite: NEGATIVE
PROTEIN: NEGATIVE mg/dL
Specific Gravity, Urine: 1.016 (ref 1.005–1.030)
UROBILINOGEN UA: 1 mg/dL (ref 0.0–1.0)
pH: 6.5 (ref 5.0–8.0)

## 2014-11-14 LAB — COMPREHENSIVE METABOLIC PANEL
ALT: 50 U/L (ref 17–63)
AST: 69 U/L — ABNORMAL HIGH (ref 15–41)
Albumin: 4.1 g/dL (ref 3.5–5.0)
Alkaline Phosphatase: 41 U/L (ref 38–126)
Anion gap: 12 (ref 5–15)
BILIRUBIN TOTAL: 2.2 mg/dL — AB (ref 0.3–1.2)
BUN: 11 mg/dL (ref 6–20)
CO2: 25 mmol/L (ref 22–32)
CREATININE: 1.3 mg/dL — AB (ref 0.61–1.24)
Calcium: 9.6 mg/dL (ref 8.9–10.3)
Chloride: 98 mmol/L — ABNORMAL LOW (ref 101–111)
GFR calc Af Amer: >60 mL/min (ref >60)
GFR calc non Af Amer: 52 mL/min — ABNORMAL LOW (ref >60)
Glucose, Bld: 127 mg/dL — ABNORMAL HIGH (ref 65–99)
Potassium: 4.1 mmol/L (ref 3.5–5.1)
SODIUM: 135 mmol/L (ref 135–145)
TOTAL PROTEIN: 6.6 g/dL (ref 6.5–8.1)

## 2014-11-14 LAB — CBC
HCT: 43.2 % (ref 39.0–52.0)
HEMOGLOBIN: 14.9 g/dL (ref 13.0–17.0)
MCH: 32.3 pg (ref 26.0–34.0)
MCHC: 34.5 g/dL (ref 30.0–36.0)
MCV: 93.7 fL (ref 78.0–100.0)
PLATELETS: 199 10*3/uL (ref 150–400)
RBC: 4.61 MIL/uL (ref 4.22–5.81)
RDW: 14.3 % (ref 11.5–15.5)
WBC: 13 10*3/uL — ABNORMAL HIGH (ref 4.0–10.5)

## 2014-11-14 LAB — I-STAT TROPONIN, ED: Troponin i, poc: 0 ng/mL (ref 0.00–0.08)

## 2014-11-14 LAB — LIPASE, BLOOD: Lipase: >3000 U/L — ABNORMAL HIGH (ref 22–51)

## 2014-11-14 LAB — URINE MICROSCOPIC-ADD ON

## 2014-11-14 MED ORDER — HYDROMORPHONE HCL 1 MG/ML IJ SOLN
1.0000 mg | Freq: Once | INTRAMUSCULAR | Status: AC
  Administered 2014-11-14: 1 mg via INTRAVENOUS
  Filled 2014-11-14: qty 1

## 2014-11-14 MED ORDER — ONDANSETRON HCL 4 MG/2ML IJ SOLN
4.0000 mg | Freq: Once | INTRAMUSCULAR | Status: AC | PRN
Start: 2014-11-14 — End: 2014-11-14
  Administered 2014-11-14: 4 mg via INTRAVENOUS
  Filled 2014-11-14: qty 2

## 2014-11-14 MED ORDER — IOHEXOL 300 MG/ML  SOLN
25.0000 mL | Freq: Once | INTRAMUSCULAR | Status: AC | PRN
  Administered 2014-11-14: 25 mL via ORAL

## 2014-11-14 MED ORDER — IOHEXOL 300 MG/ML  SOLN
100.0000 mL | Freq: Once | INTRAMUSCULAR | Status: AC | PRN
  Administered 2014-11-14: 100 mL via INTRAVENOUS

## 2014-11-14 MED ORDER — SODIUM CHLORIDE 0.9 % IV BOLUS (SEPSIS)
1000.0000 mL | Freq: Once | INTRAVENOUS | Status: AC
  Administered 2014-11-14: 1000 mL via INTRAVENOUS

## 2014-11-14 NOTE — ED Notes (Signed)
RUQ pain that radiates to R flank with nausea, vomited x 2, and difficulty urinating since 5pm.

## 2014-11-14 NOTE — ED Provider Notes (Signed)
CSN: [id number]     Arrival date & time 11/14/14  2014 History   First MD Initiated Contact with Patient 11/14/14 2202     Chief Complaint  Patient presents with  . Abdominal Pain  . Flank Pain     (Consider location/radiation/quality/duration/timing/severity/associated sxs/prior Treatment) HPI Comments: 74 y/o male with a hx of HTN, HLD, COPD, CAD s/p PCI, CHF s/p dual chamber ICD, and AAA presents to the ED for evaluation of abdominal pain. Patient states that he "hasn't felt right all week". He reports some pain in his RUQ yesterday which lasted 30 minutes before spontaneously resolving. The pain returned at 1700 today and has been constant since this time. He reports associated nausea and emesis. No aggravation with food intake; he reports only having 2 pieces of toast today. The patient states that his pain radiates from his RUQ around to his right mid back. He has tried a suppository and Pepto-Bismol for symptoms, all of which has provided him no relief. No hx of abdominal surgeries. Patient denies ETOH use; states he "rarely drinks". He states that he has had similar pain sporadically over the last few months for which he came to the ED. All of these work ups were unremarkable, per patient.  PCP - Dr. Doug Sou GI - Dr. Henrene Pastor  Patient is a 74 y.o. male presenting with abdominal pain and flank pain. The history is provided by the patient and the spouse. No language interpreter was used.  Abdominal Pain Associated symptoms: nausea and vomiting   Associated symptoms: no chest pain, no fever and no shortness of breath   Flank Pain Associated symptoms include abdominal pain, nausea and vomiting. Pertinent negatives include no chest pain or fever.    Past Medical History  Diagnosis Date  . Hypertension   . Hyperlipidemia   . COPD (chronic obstructive pulmonary disease)     a. followed by pulmonary, COPD GOLD stage II  . GERD (gastroesophageal reflux disease)   . Diverticulitis   . CAD  (coronary artery disease)     a. s/p MI in 1994 with PCI to LAD at that time b. cath 10/2012 demonstrated EF 30%, inferior akinesis with mild hypokinesis of all walls, patent LAD and RCA stents; ostial PDA with 80-90% obstruction with medical therapy recommended   . Depression   . Aneurysm     abdominal <4  . Tobacco abuse   . Ventricular tachycardia     a. 08/2009 s/p BSX E110 Teligen 100 AICD, ser#: [id number];  b. 08/2008 VT req ATP - detection reprogrammed from 160 to 150.  [name] Chronic systolic CHF (congestive heart failure)   . Hiatal hernia    Past Surgical History  Procedure Laterality Date  . Hernia repair    . Foot surgery      lt  . Hemorrhoid banding    . Retinal detachment surgery      right  . Nose surgery      skin cancer-mohs-skin graft  . Colonoscopy    . Tenolysis Right 12/21/2013    Procedure: TENOLYSIS FLEXOR CARPI RADIALIS ,DEBRIDEMENT RIGHT JOINT WRIST,DEBRIDEMENT SCAPHOTRAPEZIAL TRAPEZOID, REPAIR OF EXTENSOR HOOD;  Surgeon: Daryll Brod, MD;  Location: [facility];  Service: Orthopedics;  Laterality: Right;  . Left heart catheterization with coronary angiogram N/A 11/25/2012    demonstrated EF 30%, inferior akinesis with mild hypokinesis of all walls, patent LAD and RCA stents; ostial PDA with 80-90% obstruction with medical therapy recommended  . Implantable cardioverter defibrillator implant  09/06/09    BSX dual chamber ICD implanted in [place] for cardiac arrest 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   Family History  Problem Relation Age of Onset  . Heart attack Brother   . CAD Father   . CAD Mother   . Hypertension Mother   . Hypertension Father   . Hypertension Brother   . Stroke Neg Hx    Social History  Substance Use Topics  . Smoking status: Current Every Day Smoker -- 1.00 packs/day for 55 years    Types: Cigarettes  . Smokeless tobacco: Never Used     Comment: attending the "stop smoking" classes through [place]  . Alcohol Use: 0.0 oz/week    0  Standard drinks or equivalent per week     Comment: occasionally     Review of Systems  Constitutional: Negative for fever.  Respiratory: Negative for shortness of breath.   Cardiovascular: Negative for chest pain.  Gastrointestinal: Positive for nausea, vomiting and abdominal pain. Negative for blood in stool.  Genitourinary: Positive for flank pain.  Neurological: Negative for syncope.  All other systems reviewed and are negative.   Allergies  Sulfa antibiotics  Home Medications   Prior to Admission medications   Medication Sig Start Date End Date Taking? Authorizing Provider  amiodarone (PACERONE) 200 MG tablet take 1 tablet by mouth once daily 10/12/14  Yes [name], MD  aspirin 81 MG tablet Take 81 mg by mouth daily.   Yes Historical Provider, MD  atorvastatin (LIPITOR) 80 MG tablet Take 1 tablet (80 mg total) by mouth daily. 04/12/14  Yes Belva Crome, MD  benazepril (LOTENSIN) 10 MG tablet Take 1 tablet (10 mg total) by mouth daily. 10/29/14  Yes Belva Crome, MD  budesonide-formoterol [facility]) 160-4.5 MCG/ACT inhaler Take 2 puffs first thing in am and then another 2 puffs about 12 hours later. Patient taking differently: Inhale 2 puffs into the lungs first thing in the am and then inhale another 2 puffs into the lungs about 12 hours later. 02/06/14  Yes Tanda Rockers, MD  busPIRone (BUSPAR) 15 MG tablet Take 7.5 mg by mouth See admin instructions. Take one half tablet in the morning and one half tablet in the evening. 06/04/14  Yes Historical Provider, MD  carvedilol (COREG) 12.5 MG tablet Take 1 tablet (12.5 mg total) by mouth 2 (two) times daily with a meal. 10/29/14  Yes Belva Crome, MD  cetirizine (ZYRTEC) 10 MG tablet Take 10 mg by mouth daily as needed for allergies.   Yes Historical Provider, MD  Choline Fenofibrate (TRILIPIX) 135 MG capsule Take 1 capsule (135 mg total) by mouth daily. 12/25/13  Yes Belva Crome, MD  furosemide (LASIX) 40 MG tablet Take 20 mg by  mouth daily.    Yes Historical Provider, MD  gabapentin (NEURONTIN) 100 MG capsule Take 300 mg by mouth 2 (two) times daily.  02/08/14  Yes Historical Provider, MD  hyoscyamine (LEVSIN SL) 0.125 MG SL tablet Place 1 tablet (0.125 mg total) under the tongue every 4 (four) hours as needed. Patient taking differently: Place 0.125 mg under the tongue every 4 (four) hours as needed for cramping.  10/08/14  Yes [name], MD  Ipratropium-Albuterol (COMBIVENT RESPIMAT) 20-100 MCG/ACT AERS respimat Inhale 1 puff into the lungs 4 (four) times daily. Shortness of breath or wheezing Patient taking differently: Inhale 1 puff into the lungs every 6 (six) hours as needed for wheezing. Shortness of breath or wheezing 09/21/14  Yes Tanda Rockers, MD  nitroGLYCERIN (NITROSTAT) 0.4 MG SL tablet Place 1 tablet (0.4 mg total) under the tongue every 5 (five) minutes as needed for chest pain. 11/25/12  Yes Belva Crome, MD  omeprazole (PRILOSEC) 20 MG capsule Take 20 mg by mouth 2 (two) times daily.   Yes Historical Provider, MD  PARoxetine (PAXIL) 40 MG tablet take 1 tablet by mouth every morning 07/19/14  Yes Olga Millers, MD  polyethylene glycol (MIRALAX / GLYCOLAX) packet Take 17 g by mouth daily as needed. (CONSTIPATION) 08/13/14  Yes Historical Provider, MD  spironolactone (ALDACTONE) 25 MG tablet Take 0.5 tablets (12.5 mg total) by mouth daily. 09/19/14  Yes Belva Crome, MD  tamsulosin [facility]) 0.4 MG CAPS capsule take 1 capsule by mouth daily AFTER SUPPER 09/24/14  Yes Olga Millers, MD  tiotropium (SPIRIVA) 18 MCG inhalation capsule Place 1 capsule (18 mcg total) into inhaler and inhale daily. 06/25/14  Yes Juanito Doom, MD  varenicline (CHANTIX PAK) 0.5 MG X 11 & 1 MG X 42 tablet Take one 0.5 mg tablet by mouth once daily for 3 days, then increase to one 0.5 mg tablet twice daily for 4 days, then increase to one 1 mg tablet twice daily. 10/18/14  Yes Juanito Doom, MD   BP 102/55 mmHg  Pulse 65   Temp(Src) 97.6 F (36.4 C) (Oral)  Resp 16  SpO2 97%   Physical Exam  Constitutional: He is oriented to person, place, and time. He appears well-developed and well-nourished. No distress.  Patient appears uncomfortable  HENT:  Head: Normocephalic and atraumatic.  Eyes: Conjunctivae and EOM are normal. No scleral icterus.  Neck: Normal range of motion.  Cardiovascular: Normal rate, regular rhythm and intact distal pulses.   Pulmonary/Chest: Effort normal and breath sounds normal. No respiratory distress. He has no rales.  No tachypnea or dyspnea. Chest expansion symmetric.  Abdominal: Soft. There is tenderness. There is no rebound and no guarding.  Exquisite RUQ and epigastric TTP with voluntary guarding. No masses or peritoneal signs.  Musculoskeletal: Normal range of motion.  Neurological: He is alert and oriented to person, place, and time.  Skin: Skin is warm and dry. No rash noted. He is not diaphoretic. No erythema. No pallor.  Psychiatric: He has a normal mood and affect. His behavior is normal.  Nursing note and vitals reviewed.   ED Course  Procedures (including critical care time) Labs Review Labs Reviewed  LIPASE, BLOOD - Abnormal; Notable for the following:    Lipase >3000 (*)    All other components within normal limits  COMPREHENSIVE METABOLIC PANEL - Abnormal; Notable for the following:    Chloride 98 (*)    Glucose, Bld 127 (*)    Creatinine, Ser 1.30 (*)    AST 69 (*)    Total Bilirubin 2.2 (*)    GFR calc non Af Amer 52 (*)    All other components within normal limits  CBC - Abnormal; Notable for the following:    WBC 13.0 (*)    All other components within normal limits  URINALYSIS, ROUTINE W REFLEX MICROSCOPIC (NOT AT [facility]) - Abnormal; Notable for the following:    Color, Urine AMBER (*)    Bilirubin Urine SMALL (*)    Ketones, ur 15 (*)    Leukocytes, UA TRACE (*)    All other components within normal limits  URINE MICROSCOPIC-ADD ON - Abnormal;  Notable for the following:    Casts HYALINE  CASTS (*)    All other components within normal limits  AMYLASE  LACTATE DEHYDROGENASE  I-STAT TROPOININ, ED    Imaging Review Ct Abdomen Pelvis W Contrast  11/15/2014   CLINICAL DATA:  Right upper quadrant pain to the right flank with nausea. Initial encounter.  EXAM: CT ABDOMEN AND PELVIS WITH CONTRAST  TECHNIQUE: Multidetector CT imaging of the abdomen and pelvis was performed using the standard protocol following bolus administration of intravenous contrast.  CONTRAST:  127mL OMNIPAQUE IOHEXOL 300 MG/ML  SOLN  COMPARISON:  08/09/2014  FINDINGS: BODY WALL: Fatty inguinal hernias, greater on the left. Periumbilical hernia containing noninflamed small bowel  LOWER CHEST: Small sliding hiatal hernia.  Dual-chamber pacer leads noted.  Coronary atherosclerosis.  ABDOMEN/PELVIS:  Liver: Low densities in the liver are most consistent with cysts. A cyst near the gallbladder fossa has decompressed since 2007.  Biliary: Fall gallbladder without inflammatory change. Chronic enlargement of the common bile duct up to 13 mm. No calcified stone.  Pancreas: Subtle haziness of fat around the pancreas.  Spleen: Unremarkable.  Adrenals: Bilateral nodular adrenal thickening with low-density appearance on prior noncontrast CT consistent with adenomas. Dominant on the right measures 12 mm and on the left 16 mm.  Kidneys and ureters: No hydronephrosis or stone. 27 mm right renal cyst.  Bladder: Unremarkable.  Reproductive: Mild enlargement of the prostate.  Bowel: No obstruction. No appendicitis. Incidental 3 cm submucosal lipoma in the cecum  Retroperitoneum: Enlarged peripancreatic and porta hepatis lymph nodes are stable since 2007 considered reactive.  Peritoneum: No ascites or pneumoperitoneum.  Vascular: 33 mm infrarenal aortic aneurysm. No change from prior based on my measurements. No acute vascular findings.  OSSEOUS: Bilateral L5 pars defects with borderline grade 2  anterolisthesis and biforaminal stenosis with L5 impingement.  IMPRESSION: 1. Subtle if any pancreatitis findings to correlate with elevated lipase. 2. Dilated common bile duct. Although no calcified stone on today's scan, calcified stone in the distal CBD likely present on 08/09/2014 scan. Biliary pancreatitis is suspected. 3. Umbilical hernia containing small bowel without obstruction. 4. Bilateral adrenal adenomas. 5. Aneurysmal infrarenal aorta up to 33 mm. Recommend followup by ultrasound in 3 years. This recommendation follows ACR consensus guidelines: White Paper of the ACR Incidental Findings Committee II on Vascular Findings. Natasha Mead Coll Radiol 2013; 10:789-794   Electronically Signed   By: Monte Fantasia M.D.   On: 11/15/2014 00:32     I have personally reviewed and evaluated these images and lab results as part of my medical decision-making.   EKG Interpretation   Date/Time:  Wednesday November 14 2014 20:25:04 EDT Ventricular Rate:  60 PR Interval:  208 QRS Duration: 138 QT Interval:  478 QTC Calculation: 478 R Axis:   88 Text Interpretation:  Atrial-paced rhythm ST depressions in lateral leads,  more pronounced than prior Confirmed by LIU MD, DANA [id number]) on 11/14/2014  8:28:21 PM      MDM   Final diagnoses:  Acute biliary pancreatitis    74 year old male presents to the emergency department for evaluation of abdominal pain with nausea and vomiting, onset at 1700 today. Patient found to have a leukocytosis of 13 with an elevated bilirubin of 2.2. Lipase is greater than 3000. In the setting of a dilated common bile duct and likely history of gallstone in May, suspect biliary pancreatitis today. Will admit for further management and pain control. Patient likely to require ERCP in the AM. He is unable to have MR imaging due to ICD for  CHF.   Filed Vitals:   11/14/14 2020 11/14/14 2215 11/14/14 2230 11/14/14 2346  BP: 107/66 96/58 99/58  102/55  Pulse: 63 62 62 65  Temp: 97.6 F  (36.4 C)     TempSrc: Oral     Resp: 20   16  SpO2: 96% 96% 96% 97%     Antonietta Breach, PA-C 11/15/14 0050  Evelina Bucy, MD 11/15/14 [phone]

## 2014-11-14 NOTE — Telephone Encounter (Signed)
lmtcb for pt on home number. ATC pt's mobile, VM box full on mobile phone.

## 2014-11-15 ENCOUNTER — Emergency Department (HOSPITAL_COMMUNITY): Payer: Medicare Other

## 2014-11-15 ENCOUNTER — Encounter (HOSPITAL_COMMUNITY): Payer: Self-pay | Admitting: Internal Medicine

## 2014-11-15 DIAGNOSIS — I252 Old myocardial infarction: Secondary | ICD-10-CM | POA: Diagnosis not present

## 2014-11-15 DIAGNOSIS — I472 Ventricular tachycardia: Secondary | ICD-10-CM | POA: Diagnosis not present

## 2014-11-15 DIAGNOSIS — Z7982 Long term (current) use of aspirin: Secondary | ICD-10-CM | POA: Diagnosis not present

## 2014-11-15 DIAGNOSIS — K219 Gastro-esophageal reflux disease without esophagitis: Secondary | ICD-10-CM | POA: Diagnosis present

## 2014-11-15 DIAGNOSIS — Z95810 Presence of automatic (implantable) cardiac defibrillator: Secondary | ICD-10-CM | POA: Diagnosis not present

## 2014-11-15 DIAGNOSIS — Z8249 Family history of ischemic heart disease and other diseases of the circulatory system: Secondary | ICD-10-CM | POA: Diagnosis not present

## 2014-11-15 DIAGNOSIS — K8050 Calculus of bile duct without cholangitis or cholecystitis without obstruction: Secondary | ICD-10-CM | POA: Diagnosis not present

## 2014-11-15 DIAGNOSIS — Z881 Allergy status to other antibiotic agents status: Secondary | ICD-10-CM | POA: Diagnosis not present

## 2014-11-15 DIAGNOSIS — K851 Biliary acute pancreatitis without necrosis or infection: Secondary | ICD-10-CM | POA: Diagnosis present

## 2014-11-15 DIAGNOSIS — F419 Anxiety disorder, unspecified: Secondary | ICD-10-CM | POA: Diagnosis present

## 2014-11-15 DIAGNOSIS — N179 Acute kidney failure, unspecified: Secondary | ICD-10-CM | POA: Diagnosis present

## 2014-11-15 DIAGNOSIS — J449 Chronic obstructive pulmonary disease, unspecified: Secondary | ICD-10-CM | POA: Diagnosis not present

## 2014-11-15 DIAGNOSIS — Z79899 Other long term (current) drug therapy: Secondary | ICD-10-CM | POA: Diagnosis not present

## 2014-11-15 DIAGNOSIS — Z955 Presence of coronary angioplasty implant and graft: Secondary | ICD-10-CM | POA: Diagnosis not present

## 2014-11-15 DIAGNOSIS — F329 Major depressive disorder, single episode, unspecified: Secondary | ICD-10-CM | POA: Diagnosis present

## 2014-11-15 DIAGNOSIS — I255 Ischemic cardiomyopathy: Secondary | ICD-10-CM | POA: Diagnosis present

## 2014-11-15 DIAGNOSIS — K859 Acute pancreatitis, unspecified: Secondary | ICD-10-CM | POA: Diagnosis not present

## 2014-11-15 DIAGNOSIS — I25810 Atherosclerosis of coronary artery bypass graft(s) without angina pectoris: Secondary | ICD-10-CM | POA: Diagnosis not present

## 2014-11-15 DIAGNOSIS — I5023 Acute on chronic systolic (congestive) heart failure: Secondary | ICD-10-CM | POA: Diagnosis not present

## 2014-11-15 DIAGNOSIS — I2510 Atherosclerotic heart disease of native coronary artery without angina pectoris: Secondary | ICD-10-CM | POA: Diagnosis present

## 2014-11-15 DIAGNOSIS — I10 Essential (primary) hypertension: Secondary | ICD-10-CM | POA: Diagnosis not present

## 2014-11-15 DIAGNOSIS — I739 Peripheral vascular disease, unspecified: Secondary | ICD-10-CM | POA: Diagnosis present

## 2014-11-15 DIAGNOSIS — Z85828 Personal history of other malignant neoplasm of skin: Secondary | ICD-10-CM | POA: Diagnosis not present

## 2014-11-15 DIAGNOSIS — F17210 Nicotine dependence, cigarettes, uncomplicated: Secondary | ICD-10-CM | POA: Diagnosis present

## 2014-11-15 DIAGNOSIS — I714 Abdominal aortic aneurysm, without rupture: Secondary | ICD-10-CM | POA: Diagnosis present

## 2014-11-15 DIAGNOSIS — R1011 Right upper quadrant pain: Secondary | ICD-10-CM | POA: Diagnosis not present

## 2014-11-15 DIAGNOSIS — I5022 Chronic systolic (congestive) heart failure: Secondary | ICD-10-CM | POA: Diagnosis not present

## 2014-11-15 DIAGNOSIS — E785 Hyperlipidemia, unspecified: Secondary | ICD-10-CM | POA: Diagnosis present

## 2014-11-15 DIAGNOSIS — R739 Hyperglycemia, unspecified: Secondary | ICD-10-CM | POA: Diagnosis present

## 2014-11-15 LAB — LACTATE DEHYDROGENASE: LDH: 183 U/L (ref 98–192)

## 2014-11-15 LAB — HEPATIC FUNCTION PANEL
ALBUMIN: 3.4 g/dL — AB (ref 3.5–5.0)
ALK PHOS: 36 U/L — AB (ref 38–126)
ALT: 150 U/L — ABNORMAL HIGH (ref 17–63)
AST: 144 U/L — AB (ref 15–41)
BILIRUBIN TOTAL: 1.8 mg/dL — AB (ref 0.3–1.2)
Bilirubin, Direct: 0.7 mg/dL — ABNORMAL HIGH (ref 0.1–0.5)
Indirect Bilirubin: 1.1 mg/dL — ABNORMAL HIGH (ref 0.3–0.9)
Total Protein: 5.8 g/dL — ABNORMAL LOW (ref 6.5–8.1)

## 2014-11-15 LAB — LIPASE, BLOOD: LIPASE: 384 U/L — AB (ref 22–51)

## 2014-11-15 LAB — CBC WITH DIFFERENTIAL/PLATELET
Basophils Absolute: 0 10*3/uL (ref 0.0–0.1)
Basophils Relative: 0 % (ref 0–1)
EOS ABS: 0.1 10*3/uL (ref 0.0–0.7)
EOS PCT: 1 % (ref 0–5)
HCT: 38.5 % — ABNORMAL LOW (ref 39.0–52.0)
Hemoglobin: 12.9 g/dL — ABNORMAL LOW (ref 13.0–17.0)
LYMPHS ABS: 0.7 10*3/uL (ref 0.7–4.0)
Lymphocytes Relative: 8 % — ABNORMAL LOW (ref 12–46)
MCH: 31.5 pg (ref 26.0–34.0)
MCHC: 33.5 g/dL (ref 30.0–36.0)
MCV: 93.9 fL (ref 78.0–100.0)
MONOS PCT: 9 % (ref 3–12)
Monocytes Absolute: 0.8 10*3/uL (ref 0.1–1.0)
Neutro Abs: 7.9 10*3/uL — ABNORMAL HIGH (ref 1.7–7.7)
Neutrophils Relative %: 82 % — ABNORMAL HIGH (ref 43–77)
PLATELETS: 150 10*3/uL (ref 150–400)
RBC: 4.1 MIL/uL — ABNORMAL LOW (ref 4.22–5.81)
RDW: 14.5 % (ref 11.5–15.5)
WBC: 9.6 10*3/uL (ref 4.0–10.5)

## 2014-11-15 LAB — BASIC METABOLIC PANEL
ANION GAP: 8 (ref 5–15)
BUN: 12 mg/dL (ref 6–20)
CHLORIDE: 103 mmol/L (ref 101–111)
CO2: 26 mmol/L (ref 22–32)
Calcium: 8.9 mg/dL (ref 8.9–10.3)
Creatinine, Ser: 1.32 mg/dL — ABNORMAL HIGH (ref 0.61–1.24)
GFR calc non Af Amer: 51 mL/min — ABNORMAL LOW (ref >60)
GFR, EST AFRICAN AMERICAN: 59 mL/min — AB (ref >60)
Glucose, Bld: 105 mg/dL — ABNORMAL HIGH (ref 65–99)
Potassium: 4.1 mmol/L (ref 3.5–5.1)
Sodium: 137 mmol/L (ref 135–145)

## 2014-11-15 LAB — TROPONIN I
Troponin I: <0.03 ng/mL (ref <0.031)
Troponin I: <0.03 ng/mL (ref <0.031)

## 2014-11-15 LAB — GLUCOSE, CAPILLARY: Glucose-Capillary: 111 mg/dL — ABNORMAL HIGH (ref 65–99)

## 2014-11-15 LAB — AMYLASE: AMYLASE: 1501 U/L — AB (ref 28–100)

## 2014-11-15 MED ORDER — TIOTROPIUM BROMIDE MONOHYDRATE 18 MCG IN CAPS
18.0000 ug | ORAL_CAPSULE | Freq: Every day | RESPIRATORY_TRACT | Status: DC
  Administered 2014-11-15 – 2014-11-19 (×4): 18 ug via RESPIRATORY_TRACT
  Filled 2014-11-15 (×2): qty 5

## 2014-11-15 MED ORDER — ASPIRIN EC 81 MG PO TBEC
81.0000 mg | DELAYED_RELEASE_TABLET | Freq: Every day | ORAL | Status: DC
  Administered 2014-11-15 – 2014-11-19 (×4): 81 mg via ORAL
  Filled 2014-11-15 (×6): qty 1

## 2014-11-15 MED ORDER — CARVEDILOL 12.5 MG PO TABS
12.5000 mg | ORAL_TABLET | Freq: Two times a day (BID) | ORAL | Status: DC
  Administered 2014-11-15 – 2014-11-19 (×7): 12.5 mg via ORAL
  Filled 2014-11-15 (×8): qty 1

## 2014-11-15 MED ORDER — ACETAMINOPHEN 325 MG PO TABS
650.0000 mg | ORAL_TABLET | Freq: Four times a day (QID) | ORAL | Status: DC | PRN
  Administered 2014-11-18: 650 mg via ORAL
  Filled 2014-11-15: qty 2

## 2014-11-15 MED ORDER — IPRATROPIUM-ALBUTEROL 0.5-2.5 (3) MG/3ML IN SOLN
3.0000 mL | Freq: Four times a day (QID) | RESPIRATORY_TRACT | Status: DC
  Administered 2014-11-15 – 2014-11-19 (×16): 3 mL via RESPIRATORY_TRACT
  Filled 2014-11-15 (×17): qty 3

## 2014-11-15 MED ORDER — BUSPIRONE HCL 15 MG PO TABS
7.5000 mg | ORAL_TABLET | Freq: Two times a day (BID) | ORAL | Status: DC
  Administered 2014-11-15 – 2014-11-19 (×8): 7.5 mg via ORAL
  Filled 2014-11-15 (×10): qty 1

## 2014-11-15 MED ORDER — PAROXETINE HCL 20 MG PO TABS
40.0000 mg | ORAL_TABLET | Freq: Every morning | ORAL | Status: DC
Start: 2014-11-15 — End: 2014-11-19
  Administered 2014-11-15 – 2014-11-19 (×4): 40 mg via ORAL
  Filled 2014-11-15 (×4): qty 2

## 2014-11-15 MED ORDER — TAMSULOSIN HCL 0.4 MG PO CAPS
0.4000 mg | ORAL_CAPSULE | Freq: Every day | ORAL | Status: DC
  Administered 2014-11-15 – 2014-11-18 (×4): 0.4 mg via ORAL
  Filled 2014-11-15 (×4): qty 1

## 2014-11-15 MED ORDER — FENOFIBRATE 160 MG PO TABS
160.0000 mg | ORAL_TABLET | Freq: Every day | ORAL | Status: DC
  Administered 2014-11-15 – 2014-11-19 (×4): 160 mg via ORAL
  Filled 2014-11-15 (×4): qty 1

## 2014-11-15 MED ORDER — BUDESONIDE-FORMOTEROL FUMARATE 160-4.5 MCG/ACT IN AERO
2.0000 | INHALATION_SPRAY | Freq: Two times a day (BID) | RESPIRATORY_TRACT | Status: DC
  Administered 2014-11-15 – 2014-11-19 (×7): 2 via RESPIRATORY_TRACT
  Filled 2014-11-15 (×3): qty 6

## 2014-11-15 MED ORDER — SODIUM CHLORIDE 0.9 % IV SOLN
INTRAVENOUS | Status: DC
  Administered 2014-11-15 – 2014-11-16 (×4): via INTRAVENOUS

## 2014-11-15 MED ORDER — BENAZEPRIL HCL 10 MG PO TABS
10.0000 mg | ORAL_TABLET | Freq: Every day | ORAL | Status: DC
  Administered 2014-11-17 – 2014-11-19 (×3): 10 mg via ORAL
  Filled 2014-11-15 (×6): qty 1

## 2014-11-15 MED ORDER — PANTOPRAZOLE SODIUM 40 MG PO TBEC
40.0000 mg | DELAYED_RELEASE_TABLET | Freq: Every day | ORAL | Status: DC
  Administered 2014-11-15 – 2014-11-19 (×5): 40 mg via ORAL
  Filled 2014-11-15 (×5): qty 1

## 2014-11-15 MED ORDER — SODIUM CHLORIDE 0.9 % IV BOLUS (SEPSIS)
500.0000 mL | Freq: Once | INTRAVENOUS | Status: AC
  Administered 2014-11-15: 500 mL via INTRAVENOUS

## 2014-11-15 MED ORDER — ONDANSETRON HCL 4 MG/2ML IJ SOLN: 4.0000 mg | Freq: Four times a day (QID) | INTRAMUSCULAR | Status: DC | PRN

## 2014-11-15 MED ORDER — ATORVASTATIN CALCIUM 80 MG PO TABS
80.0000 mg | ORAL_TABLET | Freq: Every day | ORAL | Status: DC
  Administered 2014-11-15 – 2014-11-19 (×4): 80 mg via ORAL
  Filled 2014-11-15 (×4): qty 1

## 2014-11-15 MED ORDER — GABAPENTIN 300 MG PO CAPS
300.0000 mg | ORAL_CAPSULE | Freq: Two times a day (BID) | ORAL | Status: DC
  Administered 2014-11-15 – 2014-11-19 (×8): 300 mg via ORAL
  Filled 2014-11-15 (×8): qty 1

## 2014-11-15 MED ORDER — ACETAMINOPHEN 650 MG RE SUPP: 650.0000 mg | Freq: Four times a day (QID) | RECTAL | Status: DC | PRN

## 2014-11-15 MED ORDER — NITROGLYCERIN 0.4 MG SL SUBL: 0.4000 mg | SUBLINGUAL_TABLET | SUBLINGUAL | Status: DC | PRN

## 2014-11-15 MED ORDER — ONDANSETRON HCL 4 MG PO TABS: 4.0000 mg | ORAL_TABLET | Freq: Four times a day (QID) | ORAL | Status: DC | PRN

## 2014-11-15 MED ORDER — MORPHINE SULFATE (PF) 2 MG/ML IV SOLN
1.0000 mg | INTRAVENOUS | Status: DC | PRN
  Administered 2014-11-15 (×2): 1 mg via INTRAVENOUS
  Filled 2014-11-15 (×2): qty 1

## 2014-11-15 MED ORDER — AMIODARONE HCL 200 MG PO TABS
200.0000 mg | ORAL_TABLET | Freq: Every day | ORAL | Status: DC
  Administered 2014-11-15: 200 mg via ORAL
  Filled 2014-11-15: qty 1

## 2014-11-15 NOTE — Consult Note (Signed)
[facility] Surgery Consult Note  Steven Guzman 07-17-1940  [id number].    Requesting MD: Dr. Daleen Bo Chief Complaint/Reason for Consult: Pancreatitis  HPI:  74 y/o very pleasant white male with PMH ischemic CM (EF 35% 09/09/14), s/p ICD (2011), recurrent V tach (admission 09/08/14), AAA, COPD, CKD, GERD, HH presented to [facility] with prolonged RUQ abdominal pain along with N/V.  Symptoms started at 5 PM on 11/14/14 and were more severe than his previous 3 episodes in the past which usually last 45 min to a few hours. The pain radiates into his back. He had nausea and bilious/nonbloody vomiting.  The symptoms were triggered by food, but not any particular type of food.  He rarely eats fried/greasy foods.  He tried peptobisol, heating pad, suppository, and miralax without relief.  He has been seen in the past for these symptoms at [place] and at [facility].  Each time the sypmotoms resolved and he went home from th ED.  He has also seen Dr. Henrene Pastor in July 2016 about the symptoms in the past.    Lipase >3000, Down to 384 today. Transaminases rising: ALT >AST. T bili 2.2 on arrival,1.8 today. Yesterday's CT shows subtle pancreatitis, dilated CBD (chronic enlargement at 58m), the radiologist noted he had a calcified CBD stone on CT scan from 08/09/14, but was not seen on this scan.  Therefore, he suspected biliary pancreatis as the cause.  The patient has an ICD and therefore could not get a MRCP.  [place]ore[place]oday was normal without stones.  He denies any heavy drinking actually drinks very rarely.  Denies taking any diabetic or psych medications.  Says his triglycerides have been high in the past but never >1000's.  On 11/20/13 his trigs were 184.  H/o GERD and hiatal hernia.     ROS: All systems reviewed and otherwise negative except for as above  Family History  Problem Relation Age of Onset  . Heart attack Brother   . CAD Father   . CAD Mother   . Hypertension Mother   . Hypertension Father   .  Hypertension Brother   . Stroke Neg Hx     Past Medical History  Diagnosis Date  . Hypertension   . Hyperlipidemia   . COPD (chronic obstructive pulmonary disease)     a. followed by pulmonary, COPD GOLD stage II  . GERD (gastroesophageal reflux disease)   . Diverticulosis of colon 07/2014    noted on CT  . CAD (coronary artery disease)     a. s/p MI in 1994 with PCI to LAD at that time b. cath 10/2012 demonstrated EF 30%, inferior akinesis with mild hypokinesis of all walls, patent LAD and RCA stents; ostial PDA with 80-90% obstruction with medical therapy recommended   . Depression   . Aneurysm     abdominal <4. 3.6 cm on ultrasound 09/2014.   . Tobacco abuse   . Ventricular tachycardia     a. 08/2009 s/p BSX E110 Teligen 100 AICD, ser#: [id number]  b. 08/2008 VT req ATP - detection reprogrammed from 160 to 150.  .Marland KitchenChronic systolic CHF (congestive heart failure)     EF 30 to 35 % as of 09/2014.   .Marland KitchenHiatal hernia   . Prostate enlargement 07/2014    observed on CT    Past Surgical History  Procedure Laterality Date  . Hernia repair    . Foot surgery      lt  . Hemorrhoid banding    .  Retinal detachment surgery      right  . Nose surgery      skin cancer-mohs-skin graft  . Colonoscopy    . Tenolysis Right 12/21/2013    Procedure: TENOLYSIS FLEXOR CARPI RADIALIS ,DEBRIDEMENT RIGHT JOINT WRIST,DEBRIDEMENT SCAPHOTRAPEZIAL TRAPEZOID, REPAIR OF EXTENSOR HOOD;  Surgeon: Daryll Brod, MD;  Location: [place];  Service: Orthopedics;  Laterality: Right;  . Left heart catheterization with coronary angiogram N/A 11/25/2012    demonstrated EF 30%, inferior akinesis with mild hypokinesis of all walls, patent LAD and RCA stents; ostial PDA with 80-90% obstruction with medical therapy recommended  . Implantable cardioverter defibrillator implant  09/06/09    BSX dual chamber ICD implanted in [place] for cardiac arrest 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    Social History:  reports that he has  been smoking Cigarettes.  He has a 55 pack-year smoking history. He has never used smokeless tobacco. He reports that he drinks alcohol. He reports that he does not use illicit drugs.  Allergies:  Allergies  Allergen Reactions  . Sulfa Antibiotics Hives    Medications Prior to Admission  Medication Sig Dispense Refill  . amiodarone (PACERONE) 200 MG tablet take 1 tablet by mouth once daily 90 tablet 0  . aspirin 81 MG tablet Take 81 mg by mouth daily.    [name] atorvastatin (LIPITOR) 80 MG tablet Take 1 tablet (80 mg total) by mouth daily. 30 tablet 6  . benazepril (LOTENSIN) 10 MG tablet Take 1 tablet (10 mg total) by mouth daily. 90 tablet 3  . budesonide-formoterol (SYMBICORT) 160-4.5 MCG/ACT inhaler Take 2 puffs first thing in am and then another 2 puffs about 12 hours later. (Patient taking differently: Inhale 2 puffs into the lungs first thing in the am and then inhale another 2 puffs into the lungs about 12 hours later.) 1 Inhaler 11  . busPIRone (BUSPAR) 15 MG tablet Take 7.5 mg by mouth See admin instructions. Take one half tablet in the morning and one half tablet in the evening.  0  . carvedilol (COREG) 12.5 MG tablet Take 1 tablet (12.5 mg total) by mouth 2 (two) times daily with a meal. 180 tablet 3  . cetirizine (ZYRTEC) 10 MG tablet Take 10 mg by mouth daily as needed for allergies.    . Choline Fenofibrate (TRILIPIX) 135 MG capsule Take 1 capsule (135 mg total) by mouth daily. 30 capsule 8  . furosemide (LASIX) 40 MG tablet Take 20 mg by mouth daily.     [name] gabapentin (NEURONTIN) 100 MG capsule Take 300 mg by mouth 2 (two) times daily.   4  . hyoscyamine (LEVSIN SL) 0.125 MG SL tablet Place 1 tablet (0.125 mg total) under the tongue every 4 (four) hours as needed. (Patient taking differently: Place 0.125 mg under the tongue every 4 (four) hours as needed for cramping. ) 30 tablet 3  . Ipratropium-Albuterol (COMBIVENT RESPIMAT) 20-100 MCG/ACT AERS respimat Inhale 1 puff into the lungs  4 (four) times daily. Shortness of breath or wheezing (Patient taking differently: Inhale 1 puff into the lungs every 6 (six) hours as needed for wheezing. Shortness of breath or wheezing) 1 Inhaler 5  . nitroGLYCERIN (NITROSTAT) 0.4 MG SL tablet Place 1 tablet (0.4 mg total) under the tongue every 5 (five) minutes as needed for chest pain. 25 tablet 12  . omeprazole (PRILOSEC) 20 MG capsule Take 20 mg by mouth 2 (two) times daily.    [name] PARoxetine (PAXIL) 40 MG tablet take 1  tablet by mouth every morning 30 tablet 11  . polyethylene glycol (MIRALAX / GLYCOLAX) packet Take 17 g by mouth daily as needed. (CONSTIPATION)  0  . spironolactone (ALDACTONE) 25 MG tablet Take 0.5 tablets (12.5 mg total) by mouth daily. 45 tablet 2  . tamsulosin (FLOMAX) 0.4 MG CAPS capsule take 1 capsule by mouth daily AFTER SUPPER 30 capsule 3  . tiotropium (SPIRIVA) 18 MCG inhalation capsule Place 1 capsule (18 mcg total) into inhaler and inhale daily. 30 capsule 3  . varenicline (CHANTIX PAK) 0.5 MG X 11 & 1 MG X 42 tablet Take one 0.5 mg tablet by mouth once daily for 3 days, then increase to one 0.5 mg tablet twice daily for 4 days, then increase to one 1 mg tablet twice daily. 53 tablet 3    Blood pressure 110/74, pulse 74, temperature 98.9 F (37.2 C), temperature source Oral, resp. rate 17, height 5' 11"  (1.803 m), weight 95.89 kg (211 lb 6.4 oz), SpO2 98 %. Physical Exam: General: pleasant, WD/WN white male who is laying in bed in NAD HEENT: head is normocephalic, atraumatic.  Sclera are non-injected/non-icteric.  PERRL.  Ears and nose without any masses or lesions.  Mouth is pink and moist Heart: regular, rate, and rhythm.  No obvious murmurs, gallops, or rubs noted.  Palpable pedal pulses bilaterally.  Defib in left side of chest Lungs: CTAB, no wheezes, rhonchi, or rales noted.  Respiratory effort nonlabored Abd: Obese, soft, ND, mild tenderness in RUQ/epigastrium, +BS, no masses, or organomegaly, umbilical  hernia which is soft, but tender to palpation. MS: all 4 extremities are symmetrical with no cyanosis, clubbing, or edema. Skin: warm and dry with no masses, lesions, or rashes Psych: A&Ox3 with an appropriate affect.   Results for orders placed or performed during the hospital encounter of 11/14/14 (from the past 48 hour(s))  Amylase     Status: Abnormal   Collection Time: 11/14/14 12:21 AM  Result Value Ref Range   Amylase 1501 (H) 28 - 100 U/L  Lactate dehydrogenase     Status: None   Collection Time: 11/14/14 12:21 AM  Result Value Ref Range   LDH 183 98 - 192 U/L  Urinalysis, Routine w reflex microscopic (not at [facility])     Status: Abnormal   Collection Time: 11/14/14  8:30 PM  Result Value Ref Range   Color, Urine AMBER (A) YELLOW    Comment: BIOCHEMICALS MAY BE AFFECTED BY COLOR   APPearance CLEAR CLEAR   Specific Gravity, Urine 1.016 1.005 - 1.030   pH 6.5 5.0 - 8.0   Glucose, UA NEGATIVE NEGATIVE mg/dL   Hgb urine dipstick NEGATIVE NEGATIVE   Bilirubin Urine SMALL (A) NEGATIVE   Ketones, ur 15 (A) NEGATIVE mg/dL   Protein, ur NEGATIVE NEGATIVE mg/dL   Urobilinogen, UA 1.0 0.0 - 1.0 mg/dL   Nitrite NEGATIVE NEGATIVE   Leukocytes, UA TRACE (A) NEGATIVE  Urine microscopic-add on     Status: Abnormal   Collection Time: 11/14/14  8:30 PM  Result Value Ref Range   Squamous Epithelial / LPF RARE RARE   WBC, UA 0-2 <3 WBC/hpf   Bacteria, UA RARE RARE   Casts HYALINE CASTS (A) NEGATIVE   Urine-Other MUCOUS PRESENT   Lipase, blood     Status: Abnormal   Collection Time: 11/14/14  8:54 PM  Result Value Ref Range   Lipase >3000 (H) 22 - 51 U/L    Comment: RESULTS CONFIRMED BY MANUAL DILUTION  Comprehensive metabolic  panel     Status: Abnormal   Collection Time: 11/14/14  8:54 PM  Result Value Ref Range   Sodium 135 135 - 145 mmol/L   Potassium 4.1 3.5 - 5.1 mmol/L   Chloride 98 (L) 101 - 111 mmol/L   CO2 25 22 - 32 mmol/L   Glucose, Bld 127 (H) 65 - 99 mg/dL   BUN 11 6  - 20 mg/dL   Creatinine, Ser 1.30 (H) 0.61 - 1.24 mg/dL   Calcium 9.6 8.9 - 10.3 mg/dL   Total Protein 6.6 6.5 - 8.1 g/dL   Albumin 4.1 3.5 - 5.0 g/dL   AST 69 (H) 15 - 41 U/L   ALT 50 17 - 63 U/L   Alkaline Phosphatase 41 38 - 126 U/L   Total Bilirubin 2.2 (H) 0.3 - 1.2 mg/dL   GFR calc non Af Amer 52 (L) >60 mL/min   GFR calc Af Amer >60 >60 mL/min    Comment: (NOTE) The eGFR has been calculated using the CKD EPI equation. This calculation has not been validated in all clinical situations. eGFR's persistently <60 mL/min signify possible Chronic Kidney Disease.    Anion gap 12 5 - 15  CBC     Status: Abnormal   Collection Time: 11/14/14  8:54 PM  Result Value Ref Range   WBC 13.0 (H) 4.0 - 10.5 K/uL   RBC 4.61 4.22 - 5.81 MIL/uL   Hemoglobin 14.9 13.0 - 17.0 g/dL   HCT 43.2 39.0 - 52.0 %   MCV 93.7 78.0 - 100.0 fL   MCH 32.3 26.0 - 34.0 pg   MCHC 34.5 30.0 - 36.0 g/dL   RDW 14.3 11.5 - 15.5 %   Platelets 199 150 - 400 K/uL  I-stat troponin, ED     Status: None   Collection Time: 11/14/14 10:22 PM  Result Value Ref Range   Troponin i, poc 0.00 0.00 - 0.08 ng/mL   Comment 3            Comment: Due to the release kinetics of cTnI, a negative result within the first hours of the onset of symptoms does not rule out myocardial infarction with certainty. If myocardial infarction is still suspected, repeat the test at appropriate intervals.   Troponin I (q 6hr x 3)     Status: None   Collection Time: 11/15/14  3:00 AM  Result Value Ref Range   Troponin I <0.03 <0.031 ng/mL    Comment:        NO INDICATION OF MYOCARDIAL INJURY.   Glucose, capillary     Status: Abnormal   Collection Time: 11/15/14  5:34 AM  Result Value Ref Range   Glucose-Capillary 111 (H) 65 - 99 mg/dL  Troponin I (q 6hr x 3)     Status: None   Collection Time: 11/15/14  9:00 AM  Result Value Ref Range   Troponin I <0.03 <0.031 ng/mL    Comment:        NO INDICATION OF MYOCARDIAL INJURY.   Basic  metabolic panel     Status: Abnormal   Collection Time: 11/15/14  9:00 AM  Result Value Ref Range   Sodium 137 135 - 145 mmol/L   Potassium 4.1 3.5 - 5.1 mmol/L   Chloride 103 101 - 111 mmol/L   CO2 26 22 - 32 mmol/L   Glucose, Bld 105 (H) 65 - 99 mg/dL   BUN 12 6 - 20 mg/dL   Creatinine, Ser 1.32 (H) 0.61 -  1.24 mg/dL   Calcium 8.9 8.9 - 10.3 mg/dL   GFR calc non Af Amer 51 (L) >60 mL/min   GFR calc Af Amer 59 (L) >60 mL/min    Comment: (NOTE) The eGFR has been calculated using the CKD EPI equation. This calculation has not been validated in all clinical situations. eGFR's persistently <60 mL/min signify possible Chronic Kidney Disease.    Anion gap 8 5 - 15  Hepatic function panel     Status: Abnormal   Collection Time: 11/15/14  9:00 AM  Result Value Ref Range   Total Protein 5.8 (L) 6.5 - 8.1 g/dL   Albumin 3.4 (L) 3.5 - 5.0 g/dL   AST 144 (H) 15 - 41 U/L   ALT 150 (H) 17 - 63 U/L   Alkaline Phosphatase 36 (L) 38 - 126 U/L   Total Bilirubin 1.8 (H) 0.3 - 1.2 mg/dL   Bilirubin, Direct 0.7 (H) 0.1 - 0.5 mg/dL   Indirect Bilirubin 1.1 (H) 0.3 - 0.9 mg/dL  CBC WITH DIFFERENTIAL     Status: Abnormal   Collection Time: 11/15/14  9:00 AM  Result Value Ref Range   WBC 9.6 4.0 - 10.5 K/uL   RBC 4.10 (L) 4.22 - 5.81 MIL/uL   Hemoglobin 12.9 (L) 13.0 - 17.0 g/dL   HCT 38.5 (L) 39.0 - 52.0 %   MCV 93.9 78.0 - 100.0 fL   MCH 31.5 26.0 - 34.0 pg   MCHC 33.5 30.0 - 36.0 g/dL   RDW 14.5 11.5 - 15.5 %   Platelets 150 150 - 400 K/uL   Neutrophils Relative % 82 (H) 43 - 77 %   Neutro Abs 7.9 (H) 1.7 - 7.7 K/uL   Lymphocytes Relative 8 (L) 12 - 46 %   Lymphs Abs 0.7 0.7 - 4.0 K/uL   Monocytes Relative 9 3 - 12 %   Monocytes Absolute 0.8 0.1 - 1.0 K/uL   Eosinophils Relative 1 0 - 5 %   Eosinophils Absolute 0.1 0.0 - 0.7 K/uL   Basophils Relative 0 0 - 1 %   Basophils Absolute 0.0 0.0 - 0.1 K/uL  Lipase, blood     Status: Abnormal   Collection Time: 11/15/14  9:00 AM  Result  Value Ref Range   Lipase 384 (H) 22 - 51 U/L   Ct Abdomen Pelvis W Contrast  11/15/2014   CLINICAL DATA:  Right upper quadrant pain to the right flank with nausea. Initial encounter.  EXAM: CT ABDOMEN AND PELVIS WITH CONTRAST  TECHNIQUE: Multidetector CT imaging of the abdomen and pelvis was performed using the standard protocol following bolus administration of intravenous contrast.  CONTRAST:  117m OMNIPAQUE IOHEXOL 300 MG/ML  SOLN  COMPARISON:  08/09/2014  FINDINGS: BODY WALL: Fatty inguinal hernias, greater on the left. Periumbilical hernia containing noninflamed small bowel  LOWER CHEST: Small sliding hiatal hernia.  Dual-chamber pacer leads noted.  Coronary atherosclerosis.  ABDOMEN/PELVIS:  Liver: Low densities in the liver are most consistent with cysts. A cyst near the gallbladder fossa has decompressed since 2007.  Biliary: Fall gallbladder without inflammatory change. Chronic enlargement of the common bile duct up to 13 mm. No calcified stone.  Pancreas: Subtle haziness of fat around the pancreas.  Spleen: Unremarkable.  Adrenals: Bilateral nodular adrenal thickening with low-density appearance on prior noncontrast CT consistent with adenomas. Dominant on the right measures 12 mm and on the left 16 mm.  Kidneys and ureters: No hydronephrosis or stone. 27 mm right renal cyst.  Bladder: Unremarkable.  Reproductive: Mild enlargement of the prostate.  Bowel: No obstruction. No appendicitis. Incidental 3 cm submucosal lipoma in the cecum  Retroperitoneum: Enlarged peripancreatic and porta hepatis lymph nodes are stable since 2007 considered reactive.  Peritoneum: No ascites or pneumoperitoneum.  Vascular: 33 mm infrarenal aortic aneurysm. No change from prior based on my measurements. No acute vascular findings.  OSSEOUS: Bilateral L5 pars defects with borderline grade 2 anterolisthesis and biforaminal stenosis with L5 impingement.  IMPRESSION: 1. Subtle if any pancreatitis findings to correlate with  elevated lipase. 2. Dilated common bile duct. Although no calcified stone on today's scan, calcified stone in the distal CBD likely present on 08/09/2014 scan. Biliary pancreatitis is suspected. 3. Umbilical hernia containing small bowel without obstruction. 4. Bilateral adrenal adenomas. 5. Aneurysmal infrarenal aorta up to 33 mm. Recommend followup by ultrasound in 3 years. This recommendation follows ACR consensus guidelines: White Paper of the ACR Incidental Findings Committee II on Vascular Findings. Nat[facility] Coll Radiol 2013; 10:789-794   Electronically Signed   By: Monte Fantasia M.D.   On: 11/15/2014 00:32   US Abdomen Limited  11/15/2014   CLINICAL DATA:  Initial evaluation for acute right upper quadrant pain.  EXAM: US ABDOMEN LIMITED - RIGHT UPPER QUADRANT  COMPARISON:  Prior CT from earlier the same day.  FINDINGS: Gallbladder:  No gallstones or wall thickening visualized. No sonographic Murphy sign noted.  Common bile duct:  Diameter: 6.6 mm  Liver:  No focal lesion identified. Within normal limits in parenchymal echogenicity.  IMPRESSION: Normal right upper quadrant ultrasound with no sonographic evidence for cholelithiasis, acute cholecystitis, or biliary dilatation.   Electronically Signed   By: Jeannine Boga M.D.   On: 11/15/2014 00:46     Assessment/Plan Acute biliary pancreatitis -No stones on current scans, but saw stones on previous scan in CBD, because of previous scan radiologist suspected biliary pancreatitis as source. -Can't get MRI to clarify secondary to implantable defib -Will watch labs trend down, lipase already 384 from 3000 -GI consult, pending ERCP tomorrow -Will need to determine if patient is a surgical candidate and timing of potential lap chole potentially Saturday.  Primary service will need to consult cardiology for clearance.  H/o choledocholithiasis  ARF CAD s/p PCI in 10/2012 Ischemic cardiomyopathy - EF 35% COPD    LOS: 0 days    Nat Christen 11/15/2014, 2:14 PM Pager: [phone]

## 2014-11-15 NOTE — Progress Notes (Signed)
[facility] PROGRESS NOTE  Steven Guzman DVV:[id number] DOB: [birth year] DOA: 11/14/2014 PCP: Olga Millers, MD  Assessment/Plan: 74 y/o male with PMH of HTN, HPL, CAD h/o stent, COPD, CHF s/p ICD, h/o recurrent RUQ pains presented with another episode of abdominal pain, found to have pancreatitis   1. Acute pancreatitis. Suspected biliary pancreatitis, probable passed CBD stone. Vs ? Underlying stricture.  -NPO, IVF, pain control,m antiemetics. Patient is clinically improving, lipase 3000->384.  -patient will likely need cholecystectomy. Consulted GI eval per surgery request. Consulted surgery evaluation.   2. Acute renal failure - probably from nausea and vomiting.  -cont gentle IVF. Monitor fluid balance  3. CAD status post PCI last cardiac cath in August 2014 - denies any chest pain. EKG shows subtle changes. Cont aspirin and Coreg and statins. 4. History of ischemic cardiomyopathy last year measured was 35% in June 2016 with history of ICD placement and recurrent V. Tach - continue Coreg and amiodarone 5. COPD - presently not wheezing. Continue inhalers  Code Status: full  Family Communication: d/w patient, his wife  (indicate person spoken with, relationship, and if by phone, the number) Disposition Plan: home 2-3 days   Consultants:  GI  Surgery   Procedures:  nono  Antibiotics:  Zosyn 8/18>>> (indicate start date, and stop date if known)  HPI/Subjective: Alert, no distress   Objective: Filed Vitals:   11/15/14 0819  BP: 110/74  Pulse: 74  Temp: 98.9 F (37.2 C)  Resp: 17    Intake/Output Summary (Last 24 hours) at 11/15/14 1309 Last data filed at 11/15/14 1100  Gross per 24 hour  Intake   1030 ml  Output    700 ml  Net    330 ml   Filed Weights   11/15/14 0215  Weight: 95.89 kg (211 lb 6.4 oz)    Exam:   General:  Alert  Cardiovascular: s1,s2 rrr  Respiratory: CTA BL  Abdomen: soft, mild epigastric tender   Musculoskeletal:  no leg edema   Data Reviewed: Basic Metabolic Panel:  Recent Labs Lab 11/14/14 2054 11/15/14 0900  NA 135 137  K 4.1 4.1  CL 98* 103  CO2 25 26  GLUCOSE 127* 105*  BUN 11 12  CREATININE 1.30* 1.32*  CALCIUM 9.6 8.9   Liver Function Tests:  Recent Labs Lab 11/14/14 2054 11/15/14 0900  AST 69* 144*  ALT 50 150*  ALKPHOS 41 36*  BILITOT 2.2* 1.8*  PROT 6.6 5.8*  ALBUMIN 4.1 3.4*    Recent Labs Lab 11/14/14 0021 11/14/14 2054 11/15/14 0900  LIPASE  --  >3000* 384*  AMYLASE 1501*  --   --    No results for input(s): AMMONIA in the last 168 hours. CBC:  Recent Labs Lab 11/14/14 2054 11/15/14 0900  WBC 13.0* 9.6  NEUTROABS  --  7.9*  HGB 14.9 12.9*  HCT 43.2 38.5*  MCV 93.7 93.9  PLT 199 150   Cardiac Enzymes:  Recent Labs Lab 11/15/14 0300 11/15/14 0900  TROPONINI <0.03 <0.03   BNP (last 3 results) No results for input(s): BNP in the last 8760 hours.  ProBNP (last 3 results) No results for input(s): PROBNP in the last 8760 hours.  CBG:  Recent Labs Lab 11/15/14 0534  GLUCAP 111*    No results found for this or any previous visit (from the past 240 hour(s)).   Studies: Ct Abdomen Pelvis W Contrast  11/15/2014   CLINICAL DATA:  Right upper quadrant pain to the right  flank with nausea. Initial encounter.  EXAM: CT ABDOMEN AND PELVIS WITH CONTRAST  TECHNIQUE: Multidetector CT imaging of the abdomen and pelvis was performed using the standard protocol following bolus administration of intravenous contrast.  CONTRAST:  160mL OMNIPAQUE IOHEXOL 300 MG/ML  SOLN  COMPARISON:  08/09/2014  FINDINGS: BODY WALL: Fatty inguinal hernias, greater on the left. Periumbilical hernia containing noninflamed small bowel  LOWER CHEST: Small sliding hiatal hernia.  Dual-chamber pacer leads noted.  Coronary atherosclerosis.  ABDOMEN/PELVIS:  Liver: Low densities in the liver are most consistent with cysts. A cyst near the gallbladder fossa has decompressed since 2007.   Biliary: Fall gallbladder without inflammatory change. Chronic enlargement of the common bile duct up to 13 mm. No calcified stone.  Pancreas: Subtle haziness of fat around the pancreas.  Spleen: Unremarkable.  Adrenals: Bilateral nodular adrenal thickening with low-density appearance on prior noncontrast CT consistent with adenomas. Dominant on the right measures 12 mm and on the left 16 mm.  Kidneys and ureters: No hydronephrosis or stone. 27 mm right renal cyst.  Bladder: Unremarkable.  Reproductive: Mild enlargement of the prostate.  Bowel: No obstruction. No appendicitis. Incidental 3 cm submucosal lipoma in the cecum  Retroperitoneum: Enlarged peripancreatic and porta hepatis lymph nodes are stable since 2007 considered reactive.  Peritoneum: No ascites or pneumoperitoneum.  Vascular: 33 mm infrarenal aortic aneurysm. No change from prior based on my measurements. No acute vascular findings.  OSSEOUS: Bilateral L5 pars defects with borderline grade 2 anterolisthesis and biforaminal stenosis with L5 impingement.  IMPRESSION: 1. Subtle if any pancreatitis findings to correlate with elevated lipase. 2. Dilated common bile duct. Although no calcified stone on today's scan, calcified stone in the distal CBD likely present on 08/09/2014 scan. Biliary pancreatitis is suspected. 3. Umbilical hernia containing small bowel without obstruction. 4. Bilateral adrenal adenomas. 5. Aneurysmal infrarenal aorta up to 33 mm. Recommend followup by ultrasound in 3 years. This recommendation follows ACR consensus guidelines: White Paper of the ACR Incidental Findings Committee II on Vascular Findings. [facility]iol 2013; 10:789-794   Electronically Signed   By: Monte Fantasia M.D.   On: 11/15/2014 00:32   US Abdomen Limited  11/15/2014   CLINICAL DATA:  Initial evaluation for acute right upper quadrant pain.  EXAM: US ABDOMEN LIMITED - RIGHT UPPER QUADRANT  COMPARISON:  Prior CT from earlier the same day.  FINDINGS:  Gallbladder:  No gallstones or wall thickening visualized. No sonographic Murphy sign noted.  Common bile duct:  Diameter: 6.6 mm  Liver:  No focal lesion identified. Within normal limits in parenchymal echogenicity.  IMPRESSION: Normal right upper quadrant ultrasound with no sonographic evidence for cholelithiasis, acute cholecystitis, or biliary dilatation.   Electronically Signed   By: Jeannine Boga M.D.   On: 11/15/2014 00:46    Scheduled Meds: . amiodarone  200 mg Oral Daily  . aspirin EC  81 mg Oral Daily  . atorvastatin  80 mg Oral Daily  . benazepril  10 mg Oral Daily  . budesonide-formoterol  2 puff Inhalation BID  . busPIRone  7.5 mg Oral BID  . carvedilol  12.5 mg Oral BID WC  . fenofibrate  160 mg Oral Daily  . gabapentin  300 mg Oral BID  . ipratropium-albuterol  3 mL Inhalation QID  . pantoprazole  40 mg Oral Daily  . PARoxetine  40 mg Oral q morning - 10a  . tamsulosin  0.4 mg Oral QPC supper  . tiotropium  18 mcg Inhalation  Daily   Continuous Infusions: . sodium chloride 10 mL/hr at 11/15/14 0300    Active Problems:   CAD (coronary artery disease)   Essential hypertension   Chronic systolic heart failure   COPD GOLD GRADE C   Acute biliary pancreatitis    Time spent: >35 minutes     Kinnie Feil  [facility] Pager [phone]. If 7PM-7AM, please contact night-coverage at [email or web address], password [facility] 11/15/2014, 1:09 PM  LOS: 0 days

## 2014-11-15 NOTE — ED Notes (Signed)
Patient transported to Ultrasound 

## 2014-11-15 NOTE — Progress Notes (Signed)
Pt alert and oriented, independent.  Pt co right mid quad pain radiating to left side.  Pt NPO at this time.

## 2014-11-15 NOTE — H&P (Addendum)
[facility] History and Physical  STACE PEACE HQI:[id number] DOB: [birth year] DOA: 11/14/2014  Referring physician: Ms.Humes. PCP: Olga Millers, MD  Specialists: Dr.Perry gastroenterologist.  Chief Complaint: Abdominal pain with nausea and vomiting.  HPI: Steven Guzman is a 74 y.o. male with history of CAD status post stenting, ischemic cardiomyopathy with last EF measured was in June 2016 was 35%, status post ICD placement, recurrent V. tach, COPD and hypertension presents to the ER because of abdominal pain with nausea vomiting. Patient's symptoms started last evening around 4 PM and patient started developing sudden severe right upper quadrant and epigastric pain radiating to the back. Patient had multiple episodes of nausea vomiting. In the ER patient's symptoms improved with pain relief medications. CT abdomen and pelvis shows dilated CBD with a possible stone in the CBD from previous CT done in May 2016. Sonogram of the abdomen does not show any definite stones or acute cholecystitis. Patient's lipase level is markedly elevated with mildly elevated LFTs and bilirubin. Patient has been admitted for further management of acute biliary pancreatitis. Patient denies any chest pain or shortness of breath. Pain at this time is improved.   Review of Systems: As presented in the history of presenting illness, rest negative.  Past Medical History  Diagnosis Date  . Hypertension   . Hyperlipidemia   . COPD (chronic obstructive pulmonary disease)     a. followed by pulmonary, COPD GOLD stage II  . GERD (gastroesophageal reflux disease)   . Diverticulitis   . CAD (coronary artery disease)     a. s/p MI in 1994 with PCI to LAD at that time b. cath 10/2012 demonstrated EF 30%, inferior akinesis with mild hypokinesis of all walls, patent LAD and RCA stents; ostial PDA with 80-90% obstruction with medical therapy recommended   . Depression   . Aneurysm     abdominal <4  . Tobacco  abuse   . Ventricular tachycardia     a. 08/2009 s/p BSX E110 Teligen 100 AICD, ser#: [id number];  b. 08/2008 VT req ATP - detection reprogrammed from 160 to 150.  [name] Chronic systolic CHF (congestive heart failure)   . Hiatal hernia    Past Surgical History  Procedure Laterality Date  . Hernia repair    . Foot surgery      lt  . Hemorrhoid banding    . Retinal detachment surgery      right  . Nose surgery      skin cancer-mohs-skin graft  . Colonoscopy    . Tenolysis Right 12/21/2013    Procedure: TENOLYSIS FLEXOR CARPI RADIALIS ,DEBRIDEMENT RIGHT JOINT WRIST,DEBRIDEMENT SCAPHOTRAPEZIAL TRAPEZOID, REPAIR OF EXTENSOR HOOD;  Surgeon: Daryll Brod, MD;  Location: [facility];  Service: Orthopedics;  Laterality: Right;  . Left heart catheterization with coronary angiogram N/A 11/25/2012    demonstrated EF 30%, inferior akinesis with mild hypokinesis of all walls, patent LAD and RCA stents; ostial PDA with 80-90% obstruction with medical therapy recommended  . Implantable cardioverter defibrillator implant  09/06/09    BSX dual chamber ICD implanted in [place] for cardiac arrest 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   Social History:  reports that he has been smoking Cigarettes.  He has a 55 pack-year smoking history. He has never used smokeless tobacco. He reports that he drinks alcohol. He reports that he does not use illicit drugs. Where does patient live home. Can patient participate in ADLs? Yes.  Allergies  Allergen Reactions  . Sulfa Antibiotics Hives  Family History:  Family History  Problem Relation Age of Onset  . Heart attack Brother   . CAD Father   . CAD Mother   . Hypertension Mother   . Hypertension Father   . Hypertension Brother   . Stroke Neg Hx       Prior to Admission medications   Medication Sig Start Date End Date Taking? Authorizing Provider  amiodarone (PACERONE) 200 MG tablet take 1 tablet by mouth once daily 10/12/14  Yes [name], MD  aspirin 81 MG  tablet Take 81 mg by mouth daily.   Yes Historical Provider, MD  atorvastatin (LIPITOR) 80 MG tablet Take 1 tablet (80 mg total) by mouth daily. 04/12/14  Yes Belva Crome, MD  benazepril (LOTENSIN) 10 MG tablet Take 1 tablet (10 mg total) by mouth daily. 10/29/14  Yes Belva Crome, MD  budesonide-formoterol [facility]) 160-4.5 MCG/ACT inhaler Take 2 puffs first thing in am and then another 2 puffs about 12 hours later. Patient taking differently: Inhale 2 puffs into the lungs first thing in the am and then inhale another 2 puffs into the lungs about 12 hours later. 02/06/14  Yes Tanda Rockers, MD  busPIRone (BUSPAR) 15 MG tablet Take 7.5 mg by mouth See admin instructions. Take one half tablet in the morning and one half tablet in the evening. 06/04/14  Yes Historical Provider, MD  carvedilol (COREG) 12.5 MG tablet Take 1 tablet (12.5 mg total) by mouth 2 (two) times daily with a meal. 10/29/14  Yes Belva Crome, MD  cetirizine (ZYRTEC) 10 MG tablet Take 10 mg by mouth daily as needed for allergies.   Yes Historical Provider, MD  Choline Fenofibrate (TRILIPIX) 135 MG capsule Take 1 capsule (135 mg total) by mouth daily. 12/25/13  Yes Belva Crome, MD  furosemide (LASIX) 40 MG tablet Take 20 mg by mouth daily.    Yes Historical Provider, MD  gabapentin (NEURONTIN) 100 MG capsule Take 300 mg by mouth 2 (two) times daily.  02/08/14  Yes Historical Provider, MD  hyoscyamine (LEVSIN SL) 0.125 MG SL tablet Place 1 tablet (0.125 mg total) under the tongue every 4 (four) hours as needed. Patient taking differently: Place 0.125 mg under the tongue every 4 (four) hours as needed for cramping.  10/08/14  Yes [name], MD  Ipratropium-Albuterol (COMBIVENT RESPIMAT) 20-100 MCG/ACT AERS respimat Inhale 1 puff into the lungs 4 (four) times daily. Shortness of breath or wheezing Patient taking differently: Inhale 1 puff into the lungs every 6 (six) hours as needed for wheezing. Shortness of breath or wheezing 09/21/14   Yes Tanda Rockers, MD  nitroGLYCERIN (NITROSTAT) 0.4 MG SL tablet Place 1 tablet (0.4 mg total) under the tongue every 5 (five) minutes as needed for chest pain. 11/25/12  Yes Belva Crome, MD  omeprazole (PRILOSEC) 20 MG capsule Take 20 mg by mouth 2 (two) times daily.   Yes Historical Provider, MD  PARoxetine (PAXIL) 40 MG tablet take 1 tablet by mouth every morning 07/19/14  Yes Olga Millers, MD  polyethylene glycol (MIRALAX / GLYCOLAX) packet Take 17 g by mouth daily as needed. (CONSTIPATION) 08/13/14  Yes Historical Provider, MD  spironolactone (ALDACTONE) 25 MG tablet Take 0.5 tablets (12.5 mg total) by mouth daily. 09/19/14  Yes Belva Crome, MD  tamsulosin [facility]) 0.4 MG CAPS capsule take 1 capsule by mouth daily AFTER SUPPER 09/24/14  Yes Olga Millers, MD  tiotropium (SPIRIVA) 18 MCG inhalation  capsule Place 1 capsule (18 mcg total) into inhaler and inhale daily. 06/25/14  Yes Juanito Doom, MD  varenicline (CHANTIX PAK) 0.5 MG X 11 & 1 MG X 42 tablet Take one 0.5 mg tablet by mouth once daily for 3 days, then increase to one 0.5 mg tablet twice daily for 4 days, then increase to one 1 mg tablet twice daily. 10/18/14  Yes Juanito Doom, MD    Physical Exam: Filed Vitals:   11/14/14 2215 11/14/14 2230 11/14/14 2346 11/15/14 0130  BP: 96/58 99/58 102/55 125/69  Pulse: 62 62 65 68  Temp:      TempSrc:      Resp:   16 22  SpO2: 96% 96% 97% 92%     General:  Moderately built and nourished.  Eyes: Anicteric no pallor.  ENT: No discharge from the ears eyes nose and mouth.  Neck: No mass felt. No neck rigidity.  Cardiovascular: S1-S2 heard.  Respiratory: No rhonchi or crepitations.  Abdomen: Soft nontender bowel sounds present (patient has received pain medications).  Skin: No rash.  Musculoskeletal: No edema.  Psychiatric: Appears normal.  Neurologic: Alert awake oriented to time place and person. Moves all extremities.  Labs on Admission:  Basic  Metabolic Panel:  Recent Labs Lab 11/14/14 2054  NA 135  K 4.1  CL 98*  CO2 25  GLUCOSE 127*  BUN 11  CREATININE 1.30*  CALCIUM 9.6   Liver Function Tests:  Recent Labs Lab 11/14/14 2054  AST 69*  ALT 50  ALKPHOS 41  BILITOT 2.2*  PROT 6.6  ALBUMIN 4.1    Recent Labs Lab 11/14/14 0021 11/14/14 2054  LIPASE  --  >3000*  AMYLASE 1501*  --    No results for input(s): AMMONIA in the last 168 hours. CBC:  Recent Labs Lab 11/14/14 2054  WBC 13.0*  HGB 14.9  HCT 43.2  MCV 93.7  PLT 199   Cardiac Enzymes: No results for input(s): CKTOTAL, CKMB, CKMBINDEX, TROPONINI in the last 168 hours.  BNP (last 3 results) No results for input(s): BNP in the last 8760 hours.  ProBNP (last 3 results) No results for input(s): PROBNP in the last 8760 hours.  CBG: No results for input(s): GLUCAP in the last 168 hours.  Radiological Exams on Admission: Ct Abdomen Pelvis W Contrast  11/15/2014   CLINICAL DATA:  Right upper quadrant pain to the right flank with nausea. Initial encounter.  EXAM: CT ABDOMEN AND PELVIS WITH CONTRAST  TECHNIQUE: Multidetector CT imaging of the abdomen and pelvis was performed using the standard protocol following bolus administration of intravenous contrast.  CONTRAST:  131mL OMNIPAQUE IOHEXOL 300 MG/ML  SOLN  COMPARISON:  08/09/2014  FINDINGS: BODY WALL: Fatty inguinal hernias, greater on the left. Periumbilical hernia containing noninflamed small bowel  LOWER CHEST: Small sliding hiatal hernia.  Dual-chamber pacer leads noted.  Coronary atherosclerosis.  ABDOMEN/PELVIS:  Liver: Low densities in the liver are most consistent with cysts. A cyst near the gallbladder fossa has decompressed since 2007.  Biliary: Fall gallbladder without inflammatory change. Chronic enlargement of the common bile duct up to 13 mm. No calcified stone.  Pancreas: Subtle haziness of fat around the pancreas.  Spleen: Unremarkable.  Adrenals: Bilateral nodular adrenal thickening  with low-density appearance on prior noncontrast CT consistent with adenomas. Dominant on the right measures 12 mm and on the left 16 mm.  Kidneys and ureters: No hydronephrosis or stone. 27 mm right renal cyst.  Bladder: Unremarkable.  Reproductive:  Mild enlargement of the prostate.  Bowel: No obstruction. No appendicitis. Incidental 3 cm submucosal lipoma in the cecum  Retroperitoneum: Enlarged peripancreatic and porta hepatis lymph nodes are stable since 2007 considered reactive.  Peritoneum: No ascites or pneumoperitoneum.  Vascular: 33 mm infrarenal aortic aneurysm. No change from prior based on my measurements. No acute vascular findings.  OSSEOUS: Bilateral L5 pars defects with borderline grade 2 anterolisthesis and biforaminal stenosis with L5 impingement.  IMPRESSION: 1. Subtle if any pancreatitis findings to correlate with elevated lipase. 2. Dilated common bile duct. Although no calcified stone on today's scan, calcified stone in the distal CBD likely present on 08/09/2014 scan. Biliary pancreatitis is suspected. 3. Umbilical hernia containing small bowel without obstruction. 4. Bilateral adrenal adenomas. 5. Aneurysmal infrarenal aorta up to 33 mm. Recommend followup by ultrasound in 3 years. This recommendation follows ACR consensus guidelines: White Paper of the ACR Incidental Findings Committee II on Vascular Findings. [facility] Radiol 2013; 10:789-794   Electronically Signed   By: Monte Fantasia M.D.   On: 11/15/2014 00:32   US Abdomen Limited  11/15/2014   CLINICAL DATA:  Initial evaluation for acute right upper quadrant pain.  EXAM: US ABDOMEN LIMITED - RIGHT UPPER QUADRANT  COMPARISON:  Prior CT from earlier the same day.  FINDINGS: Gallbladder:  No gallstones or wall thickening visualized. No sonographic Murphy sign noted.  Common bile duct:  Diameter: 6.6 mm  Liver:  No focal lesion identified. Within normal limits in parenchymal echogenicity.  IMPRESSION: Normal right upper quadrant  ultrasound with no sonographic evidence for cholelithiasis, acute cholecystitis, or biliary dilatation.   Electronically Signed   By: Jeannine Boga M.D.   On: 11/15/2014 00:46    EKG: Independently reviewed. Normal sinus rhythm with ST depressions more pronounced than previously.  Assessment/Plan Active Problems:   CAD (coronary artery disease)   Essential hypertension   Chronic systolic heart failure   COPD GOLD GRADE C   Acute biliary pancreatitis   1. Acute biliary pancreatitis - CT done today does not show any definite stones. I have discussed with on-call surgeon Dr. Donne Hazel who at this time advised to follow LFTs. Consult GI in a.m. Patient may need eventual surgical consult as there is definite evidence of stones and no other cause for the biliary obstruction. Patient has been kept nothing by mouth except medications. Follow LFTs. 2. Acute renal failure - probably from nausea and vomiting. Patient did receive 1 L fluid bolus in the ER. Since patient has ischemic cardiomyopathy with EF of 35% I'm holding off further IV fluids for now. Hold Lasix and spironolactone and closely monitor intake output metabolic panel and respiratory status. If creatinine worsens may have to hold lisinopril. 3. CAD status post PCI last cardiac cath in August 2014 - denies any chest pain. EKG shows subtle changes. Will cycle cardiac markers. Patient is on aspirin and Coreg and statins. 4. History of ischemic cardiomyopathy last year measured was 35% in June 2016 with history of ICD placement and recurrent V. Tach - continue Coreg and amiodarone. 5. COPD - presently not wheezing. Continue inhalers.  I have reviewed patient's old charts and labs. I have discussed on-call surgeon Dr. Donne Hazel.   DVT Prophylaxis SCDs in anticipation of procedure.  Code Status: Full code.  Family Communication: Discussed with patient's wife.  Disposition Plan: Admit to inpatient.    Erskine Steinfeldt N. [facility] Pager [phone].  If 7PM-7AM, please contact night-coverage [email or web address] Password [email or web address] 11/15/2014, 1:47 AM

## 2014-11-15 NOTE — Progress Notes (Signed)
Paged Dr. Daleen Bo pt bp 94/62 hr 60 does he want hold 1700 dose coreg 12.5

## 2014-11-15 NOTE — Telephone Encounter (Signed)
lmtcb x2 for pt. 

## 2014-11-15 NOTE — Consult Note (Addendum)
[facility] Consult: 1:13 PM 11/15/2014  LOS: 0 days    Referring Provider: Dr Daleen Bo  Primary Care Physician:  Olga Millers, MD Primary Gastroenterologist:  Dr. Henrene Pastor   Reason for Consultation:  Acute pancreatitis.    HPI: Steven Guzman is a 74 y.o. male.  PMH ischemic CM (EF 35% by echo 09/09/14), s/p ICD (2011), recurrent V tach (admission for this 09/08/14), AAA, COPD (still smokes, not on oxygen at home), CKD, GERD, HH, colonic diverticulosis, prostamegaly.    EGD 2013 in [place] showed what sounds like esophagitis Colonoscopy 2013 in [place] with possible colon polyps.    Seen once on 10/08/14 by Dr Henrene Pastor for hx of limited episodes of severe RUQ pain on 2 occasions in the last 12 months, lastest on 08/09/14.  Non-contrast CT scan was unrevealing ("Unenhanced liver shows no biliary ductal dilatation"). LFTs normal, Lipase not obtained.  Ultrasound 10/19/14: no GB abnormality, 7 mm CBD,stable simple cyst of right lobe liver, stable isoechoic right hepatic lobe nodule, stable 3.6 cm AAA.  Dr Henrene Pastor added sublingual Levsin prn, continued PPI.   Pt admitted yesterday from ED with abd pain, n/v. Sxs began 5 PM yesterday.  The pain was located in the right upper quadrant and radiating into his back.  He had bilious nausea and vomiting. He had eaten some toast at about 2 PM.   . patient says this episode was much worse than the previous 2 episodes. Those episodes only lasted 45 minutes at most. He was nauseated with those episodes but never vomited. Because these episodes had resolved on their own, the patient sat at home with his symptoms, figuring they would pass.  However they persisted so he came to the emergency room.  Pain and nausea relieved with meds in ED.  Has had some recurrent pain but no recurrent  nausea or vomiting Lipase >3000,  Down to 384 today.  Transaminases rising: ALT >AST. T bili 2.2 on arrival,1.8 today.  CT ab/pelvis with contrast: subtle pancreatitis, dilated CBD (chronic enlargement) at 42mm, radiologist states " Although no calcified stone on today's scan, calcified stone in the distal CBD likely present on 08/09/2014 scan. Biliary pancreatitis is suspected"    Past Medical History  Diagnosis Date  . Hypertension   . Hyperlipidemia   . COPD (chronic obstructive pulmonary disease)     a. followed by pulmonary, COPD GOLD stage II  . GERD (gastroesophageal reflux disease)   . Diverticulitis   . CAD (coronary artery disease)     a. s/p MI in 1994 with PCI to LAD at that time b. cath 10/2012 demonstrated EF 30%, inferior akinesis with mild hypokinesis of all walls, patent LAD and RCA stents; ostial PDA with 80-90% obstruction with medical therapy recommended   . Depression   . Aneurysm     abdominal <4  . Tobacco abuse   . Ventricular tachycardia     a. 08/2009 s/p BSX E110 Teligen 100 AICD, ser#: [id number];  b. 08/2008 VT req ATP - detection reprogrammed from 160 to 150.  [name] Chronic  systolic CHF (congestive heart failure)   . Hiatal hernia     Past Surgical History  Procedure Laterality Date  . Hernia repair    . Foot surgery      lt  . Hemorrhoid banding    . Retinal detachment surgery      right  . Nose surgery      skin cancer-mohs-skin graft  . Colonoscopy    . Tenolysis Right 12/21/2013    Procedure: TENOLYSIS FLEXOR CARPI RADIALIS ,DEBRIDEMENT RIGHT JOINT WRIST,DEBRIDEMENT SCAPHOTRAPEZIAL TRAPEZOID, REPAIR OF EXTENSOR HOOD;  Surgeon: Daryll Brod, MD;  Location: [facility];  Service: Orthopedics;  Laterality: Right;  . Left heart catheterization with coronary angiogram N/A 11/25/2012    demonstrated EF 30%, inferior akinesis with mild hypokinesis of all walls, patent LAD and RCA stents; ostial PDA with 80-90% obstruction with medical therapy  recommended  . Implantable cardioverter defibrillator implant  09/06/09    BSX dual chamber ICD implanted in [place] for cardiac arrest 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    Prior to Admission medications   Medication Sig Start Date End Date Taking? Authorizing Provider  amiodarone (PACERONE) 200 MG tablet take 1 tablet by mouth once daily 10/12/14  Yes [name], MD  aspirin 81 MG tablet Take 81 mg by mouth daily.   Yes Historical Provider, MD  atorvastatin (LIPITOR) 80 MG tablet Take 1 tablet (80 mg total) by mouth daily. 04/12/14  Yes Belva Crome, MD  benazepril (LOTENSIN) 10 MG tablet Take 1 tablet (10 mg total) by mouth daily. 10/29/14  Yes Belva Crome, MD  budesonide-formoterol [facility]) 160-4.5 MCG/ACT inhaler Take 2 puffs first thing in am and then another 2 puffs about 12 hours later. Patient taking differently: Inhale 2 puffs into the lungs first thing in the am and then inhale another 2 puffs into the lungs about 12 hours later. 02/06/14  Yes Tanda Rockers, MD  busPIRone (BUSPAR) 15 MG tablet Take 7.5 mg by mouth See admin instructions. Take one half tablet in the morning and one half tablet in the evening. 06/04/14  Yes Historical Provider, MD  carvedilol (COREG) 12.5 MG tablet Take 1 tablet (12.5 mg total) by mouth 2 (two) times daily with a meal. 10/29/14  Yes Belva Crome, MD  cetirizine (ZYRTEC) 10 MG tablet Take 10 mg by mouth daily as needed for allergies.   Yes Historical Provider, MD  Choline Fenofibrate (TRILIPIX) 135 MG capsule Take 1 capsule (135 mg total) by mouth daily. 12/25/13  Yes Belva Crome, MD  furosemide (LASIX) 40 MG tablet Take 20 mg by mouth daily.    Yes Historical Provider, MD  gabapentin (NEURONTIN) 100 MG capsule Take 300 mg by mouth 2 (two) times daily.  02/08/14  Yes Historical Provider, MD  hyoscyamine (LEVSIN SL) 0.125 MG SL tablet Place 1 tablet (0.125 mg total) under the tongue every 4 (four) hours as needed. Patient taking differently: Place 0.125 mg under  the tongue every 4 (four) hours as needed for cramping.  10/08/14  Yes [name], MD  Ipratropium-Albuterol (COMBIVENT RESPIMAT) 20-100 MCG/ACT AERS respimat Inhale 1 puff into the lungs 4 (four) times daily. Shortness of breath or wheezing Patient taking differently: Inhale 1 puff into the lungs every 6 (six) hours as needed for wheezing. Shortness of breath or wheezing 09/21/14  Yes Tanda Rockers, MD  nitroGLYCERIN (NITROSTAT) 0.4 MG SL tablet Place 1 tablet (0.4 mg total) under the tongue every 5 (five) minutes  as needed for chest pain. 11/25/12  Yes Belva Crome, MD  omeprazole (PRILOSEC) 20 MG capsule Take 20 mg by mouth 2 (two) times daily.   Yes Historical Provider, MD  PARoxetine (PAXIL) 40 MG tablet take 1 tablet by mouth every morning 07/19/14  Yes Olga Millers, MD  polyethylene glycol (MIRALAX / GLYCOLAX) packet Take 17 g by mouth daily as needed. (CONSTIPATION) 08/13/14  Yes Historical Provider, MD  spironolactone (ALDACTONE) 25 MG tablet Take 0.5 tablets (12.5 mg total) by mouth daily. 09/19/14  Yes Belva Crome, MD  tamsulosin [facility]) 0.4 MG CAPS capsule take 1 capsule by mouth daily AFTER SUPPER 09/24/14  Yes Olga Millers, MD  tiotropium (SPIRIVA) 18 MCG inhalation capsule Place 1 capsule (18 mcg total) into inhaler and inhale daily. 06/25/14  Yes Juanito Doom, MD  varenicline (CHANTIX PAK) 0.5 MG X 11 & 1 MG X 42 tablet Take one 0.5 mg tablet by mouth once daily for 3 days, then increase to one 0.5 mg tablet twice daily for 4 days, then increase to one 1 mg tablet twice daily. 10/18/14  Yes Juanito Doom, MD    Scheduled Meds: . amiodarone  200 mg Oral Daily  . aspirin EC  81 mg Oral Daily  . atorvastatin  80 mg Oral Daily  . benazepril  10 mg Oral Daily  . budesonide-formoterol  2 puff Inhalation BID  . busPIRone  7.5 mg Oral BID  . carvedilol  12.5 mg Oral BID WC  . fenofibrate  160 mg Oral Daily  . gabapentin  300 mg Oral BID  . ipratropium-albuterol  3 mL  Inhalation QID  . pantoprazole  40 mg Oral Daily  . PARoxetine  40 mg Oral q morning - 10a  . tamsulosin  0.4 mg Oral QPC supper  . tiotropium  18 mcg Inhalation Daily   Infusions: . sodium chloride 10 mL/hr at 11/15/14 0300   PRN Meds: acetaminophen **OR** acetaminophen, morphine injection, nitroGLYCERIN, ondansetron **OR** ondansetron (ZOFRAN) IV   Allergies as of 11/14/2014 - Review Complete 11/14/2014  Allergen Reaction Noted  . Sulfa antibiotics Hives 11/23/2012    Family History  Problem Relation Age of Onset  . Heart attack Brother   . CAD Father   . CAD Mother   . Hypertension Mother   . Hypertension Father   . Hypertension Brother   . Stroke Neg Hx     Social History   Social History  . Marital Status: Married    Spouse Name: N/A  . Number of Children: N/A  . Years of Education: N/A   Occupational History  . Retired    Social History Main Topics  . Smoking status: Current Every Day Smoker -- 1.00 packs/day for 55 years    Types: Cigarettes  . Smokeless tobacco: Never Used     Comment: attending the "stop smoking" classes through [employer]  . Alcohol Use: 0.0 oz/week    0 Standard drinks or equivalent per week     Comment: occasionally   . Drug Use: No  . Sexual Activity: Not Currently   Other Topics Concern  . Not on file   Social History Narrative    REVIEW OF SYSTEMS: Constitutional:  Generally doesn't get a lot of exercise.  Compared with March 2016, his weight has dropped about 6 pounds. No falls or profound weakness. ENT:  No nose bleeds Pulm:  Stable DOE. No recent flares of acute dyspnea or cough. CV:  No palpitations,  no LE edema.  No chest pain.  Takes low-dose aspirin for history CAD.  GU:  No hematuria, no frequency.  No dark-colored urine. GI:  Per HPI Heme:  Bruises easily.   Transfusions:  None Neuro:  No headaches, no peripheral tingling or numbness MS:  Takes gabapentin for occipital neuralgia but has not had significant neck  pain in the last several months. Derm:  No pruritus, no rash or sores.  Endocrine:  No sweats or chills.  No polyuria or dysuria Immunization:  Did not inquire. Travel:  None beyond local counties in last few months.    PHYSICAL EXAM: Vital signs in last 24 hours: Filed Vitals:   11/15/14 0819  BP: 110/74  Pulse: 74  Temp: 98.9 F (37.2 C)  Resp: 17   Wt Readings from Last 3 Encounters:  11/15/14 211 lb 6.4 oz (95.89 kg)  10/18/14 214 lb (97.07 kg)  10/15/14 219 lb (99.338 kg)   General: Pleasant, comfortable. Does not look acutely ill. Head:  No swelling, no facial asymmetry. No signs of head trauma.  Eyes:  No scleral icterus, no conjunctival pallor. EOMI. Ears:  Slightly HOH. No hearing aids in place.  Nose:  No congestion, no discharge. Mouth:  Good dentition. Moist, clear mucous membranes. Tongue midline. Neck:  No JVD, no TMG, no masses. Lungs:  Some expiratory wheezing. No cough or dyspnea. Heart: RRR. No MRG. S1/S2 audible. Abdomen:  Soft. ND. Tenderness in left upper quadrant. No guarding or rebound. Small umbilical hernia. Bowel sounds active. No HSM or masses.   Rectal: Deferred   Musc/Skeltl: No joint erythema, swelling or contracture deformity. Slight arthritic changes in the fingers/hands. Extremities:  No CCE.  Neurologic:  Patient is alert, oriented 3. Excellent historian. No tremor. No limb weakness. No tremor Skin: No purpura noted on upper extremities. Tattoos:  None Nodes:  No cervical adenopathy.   Psych:  Pleasant, in good spirits.  Relaxed.   Intake/Output from previous day: 08/17 0701 - 08/18 0700 In: 1030 [I.V.:1030] Out: 200 [Urine:200] Intake/Output this shift: Total I/O In: 0  Out: 500 [Urine:500]  LAB RESULTS:  Recent Labs  11/14/14 2054 11/15/14 0900  WBC 13.0* 9.6  HGB 14.9 12.9*  HCT 43.2 38.5*  PLT 199 150   BMET Lab Results  Component Value Date   NA 137 11/15/2014   NA 135 11/14/2014   NA 138 09/09/2014   K 4.1  11/15/2014   K 4.1 11/14/2014   K 4.2 09/09/2014   CL 103 11/15/2014   CL 98* 11/14/2014   CL 103 09/09/2014   CO2 26 11/15/2014   CO2 25 11/14/2014   CO2 28 09/09/2014   GLUCOSE 105* 11/15/2014   GLUCOSE 127* 11/14/2014   GLUCOSE 101* 09/09/2014   BUN 12 11/15/2014   BUN 11 11/14/2014   BUN 10 09/09/2014   CREATININE 1.32* 11/15/2014   CREATININE 1.30* 11/14/2014   CREATININE 1.12 09/09/2014   CALCIUM 8.9 11/15/2014   CALCIUM 9.6 11/14/2014   CALCIUM 9.1 09/09/2014   LFT  Recent Labs  11/14/14 2054 11/15/14 0900  PROT 6.6 5.8*  ALBUMIN 4.1 3.4*  AST 69* 144*  ALT 50 150*  ALKPHOS 41 36*  BILITOT 2.2* 1.8*  BILIDIR  --  0.7*  IBILI  --  1.1*   PT/INR Lab Results  Component Value Date   INR 1.07 09/08/2014   INR 1.04 11/25/2012   Hepatitis Panel No results for input(s): HEPBSAG, HCVAB, HEPAIGM, HEPBIGM in the last 72 hours.  C-Diff No components found for: CDIFF Lipase     Component Value Date/Time   LIPASE 384* 11/15/2014 0900    Drugs of Abuse  No results found for: LABOPIA, COCAINSCRNUR, LABBENZ, AMPHETMU, THCU, LABBARB   RADIOLOGY STUDIES: Ct Abdomen Pelvis W Contrast  11/15/2014   CLINICAL DATA:  Right upper quadrant pain to the right flank with nausea. Initial encounter.  EXAM: CT ABDOMEN AND PELVIS WITH CONTRAST  TECHNIQUE: Multidetector CT imaging of the abdomen and pelvis was performed using the standard protocol following bolus administration of intravenous contrast.  CONTRAST:  133mL OMNIPAQUE IOHEXOL 300 MG/ML  SOLN  COMPARISON:  08/09/2014  FINDINGS: BODY WALL: Fatty inguinal hernias, greater on the left. Periumbilical hernia containing noninflamed small bowel  LOWER CHEST: Small sliding hiatal hernia.  Dual-chamber pacer leads noted.  Coronary atherosclerosis.  ABDOMEN/PELVIS:  Liver: Low densities in the liver are most consistent with cysts. A cyst near the gallbladder fossa has decompressed since 2007.  Biliary: Fall gallbladder without  inflammatory change. Chronic enlargement of the common bile duct up to 13 mm. No calcified stone.  Pancreas: Subtle haziness of fat around the pancreas.  Spleen: Unremarkable.  Adrenals: Bilateral nodular adrenal thickening with low-density appearance on prior noncontrast CT consistent with adenomas. Dominant on the right measures 12 mm and on the left 16 mm.  Kidneys and ureters: No hydronephrosis or stone. 27 mm right renal cyst.  Bladder: Unremarkable.  Reproductive: Mild enlargement of the prostate.  Bowel: No obstruction. No appendicitis. Incidental 3 cm submucosal lipoma in the cecum  Retroperitoneum: Enlarged peripancreatic and porta hepatis lymph nodes are stable since 2007 considered reactive.  Peritoneum: No ascites or pneumoperitoneum.  Vascular: 33 mm infrarenal aortic aneurysm. No change from prior based on my measurements. No acute vascular findings.  OSSEOUS: Bilateral L5 pars defects with borderline grade 2 anterolisthesis and biforaminal stenosis with L5 impingement.  IMPRESSION: 1. Subtle if any pancreatitis findings to correlate with elevated lipase. 2. Dilated common bile duct. Although no calcified stone on today's scan, calcified stone in the distal CBD likely present on 08/09/2014 scan. Biliary pancreatitis is suspected. 3. Umbilical hernia containing small bowel without obstruction. 4. Bilateral adrenal adenomas. 5. Aneurysmal infrarenal aorta up to 33 mm. Recommend followup by ultrasound in 3 years. This recommendation follows ACR consensus guidelines: White Paper of the ACR Incidental Findings Committee II on Vascular Findings. [facility]iol 2013; 10:789-794   Electronically Signed   By: Monte Fantasia M.D.   On: 11/15/2014 00:32   US Abdomen Limited  11/15/2014   CLINICAL DATA:  Initial evaluation for acute right upper quadrant pain.  EXAM: US ABDOMEN LIMITED - RIGHT UPPER QUADRANT  COMPARISON:  Prior CT from earlier the same day.  FINDINGS: Gallbladder:  No gallstones or wall  thickening visualized. No sonographic Murphy sign noted.  Common bile duct:  Diameter: 6.6 mm  Liver:  No focal lesion identified. Within normal limits in parenchymal echogenicity.  IMPRESSION: Normal right upper quadrant ultrasound with no sonographic evidence for cholelithiasis, acute cholecystitis, or biliary dilatation.   Electronically Signed   By: Jeannine Boga M.D.   On: 11/15/2014 00:46    ENDOSCOPIC STUDIES: EGD and colonoscopies at outside hospitals per HPI.  Apparently Dr. Henrene Pastor has reviewed these reports. Nothing has been scanned into Epic yet.  IMPRESSION:   *  Acute pancreatitis. Likely biliary. Though CBD dilated, no concrete evidence of CBD stones. ? Undetected CBD stones,   ? ampullary stenosis  *  COPD  *  Cardiomyopathy, CHF, COPD, s/p ICD.   *  Hyperglycemia. Suspect glucose intolerance.  *  Peripheral vascular disease.  AAA, 3.3 cm.     PLAN:     *  ERCP at 1300 tomorrow. Preop antibiotic and postop Indocin ordered. Risks, benefits and technicalities of procedure discussed with patient and his wife using diagrams. They are agreeable to proceed. Ok to have clears until 5 AM tomorrow.   Note that surgical PA is currently consulting with the patient.   Azucena Freed  11/15/2014, 1:13 PM Pager: [phone]  GI Attending Note   Chart was reviewed and patient was examined. X-rays and lab were reviewed.    I agree with management and plans. Strongly suspect pt has a CBD stone.  Bile duct is significantly dilated (perhaps intermittently given ultrasound measurements compared with CT).  Plan to proceed with ERCP.  Unable to do MRCP because of defibrillator.  The risks, benefits, and possible complications of the procedure, including  But not limited to bleeding, perforation, surgery, and the 5-10% risk for pancreatitis, were explained to the patient.  It was explained that  post ERCP pancreatitis which, while usually is mild, occasionally maybe severe and even  life-threatening.  Patient's questions were answered.   Sandy Salaam. Deatra Ina, M.D., [facility] Cell [phone]

## 2014-11-15 NOTE — Progress Notes (Signed)
Utilization review completed. Vedika Dumlao, RN, BSN. 

## 2014-11-15 NOTE — Progress Notes (Signed)
T/O hold this pm dose coreg 12.5 po for low bp

## 2014-11-15 NOTE — Progress Notes (Signed)
Paged Dr. Daleen Bo, MRI cancelled, pacemaker cannot be scanned.

## 2014-11-15 NOTE — Progress Notes (Signed)
Report obtained from ED RN. Room ready for patient. Richetta Cubillos Joselita, RN 

## 2014-11-16 ENCOUNTER — Encounter (HOSPITAL_COMMUNITY)
Admission: EM | Disposition: A | Discharge status: Home / Self Care | Payer: Self-pay | Source: Home / Self Care | Attending: Internal Medicine

## 2014-11-16 ENCOUNTER — Inpatient Hospital Stay (HOSPITAL_COMMUNITY): Payer: Medicare Other | Admitting: Certified Registered Nurse Anesthetist

## 2014-11-16 ENCOUNTER — Inpatient Hospital Stay (HOSPITAL_COMMUNITY): Payer: Medicare Other

## 2014-11-16 ENCOUNTER — Encounter (HOSPITAL_COMMUNITY): Payer: Self-pay

## 2014-11-16 DIAGNOSIS — K8020 Calculus of gallbladder without cholecystitis without obstruction: Secondary | ICD-10-CM

## 2014-11-16 DIAGNOSIS — I5022 Chronic systolic (congestive) heart failure: Secondary | ICD-10-CM

## 2014-11-16 DIAGNOSIS — I472 Ventricular tachycardia, unspecified: Secondary | ICD-10-CM | POA: Insufficient documentation

## 2014-11-16 DIAGNOSIS — K8050 Calculus of bile duct without cholangitis or cholecystitis without obstruction: Secondary | ICD-10-CM

## 2014-11-16 HISTORY — PX: ERCP: SHX5425

## 2014-11-16 LAB — GLUCOSE, CAPILLARY
Glucose-Capillary: 86 mg/dL (ref 65–99)
Glucose-Capillary: 99 mg/dL (ref 65–99)
Glucose-Capillary: 99 mg/dL (ref 65–99)

## 2014-11-16 LAB — COMPREHENSIVE METABOLIC PANEL
ALT: 144 U/L — AB (ref 17–63)
AST: 88 U/L — AB (ref 15–41)
Albumin: 3.1 g/dL — ABNORMAL LOW (ref 3.5–5.0)
Alkaline Phosphatase: 40 U/L (ref 38–126)
Anion gap: 9 (ref 5–15)
BUN: 8 mg/dL (ref 6–20)
CHLORIDE: 104 mmol/L (ref 101–111)
CO2: 23 mmol/L (ref 22–32)
CREATININE: 1.01 mg/dL (ref 0.61–1.24)
Calcium: 8.7 mg/dL — ABNORMAL LOW (ref 8.9–10.3)
GFR calc Af Amer: >60 mL/min (ref >60)
Glucose, Bld: 97 mg/dL (ref 65–99)
Potassium: 3.6 mmol/L (ref 3.5–5.1)
Sodium: 136 mmol/L (ref 135–145)
Total Bilirubin: 1.9 mg/dL — ABNORMAL HIGH (ref 0.3–1.2)
Total Protein: 5.5 g/dL — ABNORMAL LOW (ref 6.5–8.1)

## 2014-11-16 LAB — LIPASE, BLOOD: LIPASE: 152 U/L — AB (ref 22–51)

## 2014-11-16 SURGERY — ERCP, WITH INTERVENTION IF INDICATED
Anesthesia: General

## 2014-11-16 MED ORDER — SUCCINYLCHOLINE CHLORIDE 20 MG/ML IJ SOLN
INTRAMUSCULAR | Status: DC | PRN
  Administered 2014-11-16: 80 mg via INTRAVENOUS

## 2014-11-16 MED ORDER — ONDANSETRON HCL 4 MG/2ML IJ SOLN
INTRAMUSCULAR | Status: DC | PRN
  Administered 2014-11-16: 4 mg via INTRAVENOUS

## 2014-11-16 MED ORDER — GLUCAGON HCL RDNA (DIAGNOSTIC) 1 MG IJ SOLR
INTRAMUSCULAR | Status: DC | PRN
  Administered 2014-11-16: 1 mg via INTRAVENOUS

## 2014-11-16 MED ORDER — PROMETHAZINE HCL 25 MG/ML IJ SOLN: 6.2500 mg | INTRAMUSCULAR | Status: DC | PRN

## 2014-11-16 MED ORDER — AMIODARONE HCL 200 MG PO TABS
400.0000 mg | ORAL_TABLET | Freq: Two times a day (BID) | ORAL | Status: DC
Start: 2014-11-16 — End: 2014-11-19
  Administered 2014-11-16 – 2014-11-19 (×6): 400 mg via ORAL
  Filled 2014-11-16 (×6): qty 2

## 2014-11-16 MED ORDER — METOPROLOL TARTRATE 1 MG/ML IV SOLN
INTRAVENOUS | Status: DC | PRN
  Administered 2014-11-16 (×2): 2.5 mg via INTRAVENOUS

## 2014-11-16 MED ORDER — NEOSTIGMINE METHYLSULFATE 10 MG/10ML IV SOLN
INTRAVENOUS | Status: DC | PRN
  Administered 2014-11-16: 5 mg via INTRAVENOUS

## 2014-11-16 MED ORDER — LACTATED RINGERS IV SOLN
INTRAVENOUS | Status: DC
  Administered 2014-11-16: 15:00:00 via INTRAVENOUS
  Administered 2014-11-16: 1000 mL via INTRAVENOUS

## 2014-11-16 MED ORDER — INDOMETHACIN 50 MG RE SUPP
100.0000 mg | RECTAL | Status: AC
  Administered 2014-11-16: 100 mg via RECTAL
  Filled 2014-11-16: qty 2

## 2014-11-16 MED ORDER — ROCURONIUM BROMIDE 100 MG/10ML IV SOLN
INTRAVENOUS | Status: DC | PRN
  Administered 2014-11-16: 40 mg via INTRAVENOUS

## 2014-11-16 MED ORDER — PHENYLEPHRINE HCL 10 MG/ML IJ SOLN
INTRAMUSCULAR | Status: DC | PRN
  Administered 2014-11-16 (×2): 80 ug via INTRAVENOUS
  Administered 2014-11-16: 120 ug via INTRAVENOUS

## 2014-11-16 MED ORDER — ESMOLOL HCL 10 MG/ML IV SOLN
INTRAVENOUS | Status: DC | PRN
  Administered 2014-11-16 (×2): 40 mg via INTRAVENOUS

## 2014-11-16 MED ORDER — MEPERIDINE HCL 25 MG/ML IJ SOLN: 6.2500 mg | INTRAMUSCULAR | Status: DC | PRN

## 2014-11-16 MED ORDER — SODIUM CHLORIDE 0.9 % IV SOLN: INTRAVENOUS | Status: DC

## 2014-11-16 MED ORDER — GLYCOPYRROLATE 0.2 MG/ML IJ SOLN
INTRAMUSCULAR | Status: DC | PRN
Start: 2014-11-16 — End: 2014-11-16
  Administered 2014-11-16: .8 mg via INTRAVENOUS

## 2014-11-16 MED ORDER — LIDOCAINE HCL (CARDIAC) 20 MG/ML IV SOLN
INTRAVENOUS | Status: DC | PRN
  Administered 2014-11-16: 100 mg via INTRAVENOUS

## 2014-11-16 MED ORDER — GLUCAGON HCL RDNA (DIAGNOSTIC) 1 MG IJ SOLR
INTRAMUSCULAR | Status: AC
  Filled 2014-11-16: qty 2

## 2014-11-16 MED ORDER — SODIUM CHLORIDE 0.9 % IV SOLN
3.0000 g | INTRAVENOUS | Status: AC
  Administered 2014-11-16: 3 g via INTRAVENOUS
  Filled 2014-11-16: qty 3

## 2014-11-16 MED ORDER — SODIUM CHLORIDE 0.9 % IV SOLN
INTRAVENOUS | Status: DC | PRN
  Administered 2014-11-16: 20 mL

## 2014-11-16 MED ORDER — PROPOFOL 10 MG/ML IV BOLUS
INTRAVENOUS | Status: DC | PRN
  Administered 2014-11-16: 150 mg via INTRAVENOUS
  Administered 2014-11-16: 50 mg via INTRAVENOUS

## 2014-11-16 MED ORDER — FENTANYL CITRATE (PF) 100 MCG/2ML IJ SOLN
INTRAMUSCULAR | Status: DC | PRN
  Administered 2014-11-16: 50 ug via INTRAVENOUS

## 2014-11-16 NOTE — Interval H&P Note (Signed)
History and Physical Interval Note:  11/16/2014 1:36 PM  Steven Guzman  has presented today for surgery, with the diagnosis of Pancreatitis  The various methods of treatment have been discussed with the patient and family. After consideration of risks, benefits and other options for treatment, the patient has consented to  Procedure(s): ENDOSCOPIC RETROGRADE CHOLANGIOPANCREATOGRAPHY (ERCP) (N/A) as a surgical intervention .  The patient's history has been reviewed, patient examined, no change in status, stable for surgery.  I have reviewed the patient's chart and labs.  Questions were answered to the patient's satisfaction.    The recent H&P (dated *11/15/14**) was reviewed, the patient was examined and there is no change in the patients condition since that H&P was completed.   Erskine Emery  11/16/2014, 1:36 PM    Erskine Emery

## 2014-11-16 NOTE — Anesthesia Procedure Notes (Signed)
Procedure Name: Intubation Date/Time: 11/16/2014 1:43 PM Performed by: Trixie Deis A Pre-anesthesia Checklist: Emergency Drugs available, Patient identified, Suction available, Patient being monitored and Timeout performed Patient Re-evaluated:Patient Re-evaluated prior to inductionOxygen Delivery Method: Circle system utilized Preoxygenation: Pre-oxygenation with 100% oxygen Intubation Type: IV induction Ventilation: Mask ventilation without difficulty and Oral airway inserted - appropriate to patient size Laryngoscope Size: Mac, 4 and McGraph Grade View: Grade I Tube type: Oral Tube size: 7.5 mm Number of attempts: 1 Airway Equipment and Method: Stylet Placement Confirmation: ETT inserted through vocal cords under direct vision,  positive ETCO2 and breath sounds checked- equal and bilateral Secured at: 22 cm Tube secured with: Tape Dental Injury: Teeth and Oropharynx as per pre-operative assessment

## 2014-11-16 NOTE — Progress Notes (Signed)
Events noted.  Currently he is awake, conversive, without any complaints.  Heart rate is regular. OK to advance diet as tolerated.  In view of his precarious cardiac status, would avoid elective cholecystectomy and only Rx (perhaps with percutaneous cholecystostomy tube) if he becomes symptomatic from his gallbladder.  Discussed events and updated [name] spouse.

## 2014-11-16 NOTE — Op Note (Signed)
Synopsis of pt's Colonoscopy and EGD at outside hospital Date of procedures: 08/08/2010 Endoscopist: Dr Alvera Singh MD at [facility], [place].   Colonoscopy: for hx polyps (type not known) 4 small polyps ascending colon.  Pathology: fragment of tubular adenoma 3 small polyps transverse colon.  Pathology: eroded and inflamed hyperplastic polyp. 3 small polyps sigmoid.  Pathology: mixed hyperplastic and adenomatous polyps.   Sigmoid diverticulosis Ileum normal.   EGD: surveillance of established GERD Large hiatal hernia.  Otherwise normal study to D2.   Azucena Freed PA-C

## 2014-11-16 NOTE — Progress Notes (Signed)
[facility] PROGRESS NOTE  JEREMAINE MARAJ YJE:[id number] DOB: 12-20-1940 DOA: 11/14/2014 PCP: Olga Millers, MD  Assessment/Plan: 74 y/o male with PMH of HTN, HPL, CAD h/o stent, COPD, CHF s/p ICD, h/o recurrent RUQ pains presented with another episode of abdominal pain, and found to have pancreatitis   1. Acute pancreatitis. Suspected biliary pancreatitis, probable passed CBD stone.  -NPO, IVF, pain control, antiemetics. Patient is clinically improving, lipase 3000->384->152.  -scheduled for ERCP today. appreciate GI, surgery input. patient will likely need cholecystectomy. Deescalate IV atx in 24-48 hrs  2. Acute renal failure - probably from nausea and vomiting.  -improved after gentle IVF. Monitor fluid balance  3. CAD status post PCI last cardiac cath in August 2014 - denies any chest pain. EKG shows subtle changes. Cont aspirin and Coreg and statins. 4. History of ischemic cardiomyopathy last year measured was 35% in June 2016 with history of ICD placement and recurrent V. Tach - continue Coreg and amiodarone 5. COPD - presently not wheezing. Continue inhalers  Code Status: full  Family Communication: d/w patient, his wife  (indicate person spoken with, relationship, and if by phone, the number) Disposition Plan: home 2-3 days   Consultants:  GI  Surgery   Procedures:  nono  Antibiotics:  Zosyn 8/18>>> (indicate start date, and stop date if known)  HPI/Subjective: Alert, no distress   Objective: Filed Vitals:   11/16/14 1024  BP: 108/65  Pulse: 62  Temp: 98.4 F (36.9 C)  Resp:     Intake/Output Summary (Last 24 hours) at 11/16/14 1104 Last data filed at 11/16/14 0859  Gross per 24 hour  Intake 1297.83 ml  Output   2650 ml  Net -1352.17 ml   Filed Weights   11/15/14 0215  Weight: 95.89 kg (211 lb 6.4 oz)    Exam:   General:  Alert  Cardiovascular: s1,s2 rrr  Respiratory: CTA BL  Abdomen: soft, mild epigastric tender    Musculoskeletal: no leg edema   Data Reviewed: Basic Metabolic Panel:  Recent Labs Lab 11/14/14 2054 11/15/14 0900 11/16/14 0549  NA 135 137 136  K 4.1 4.1 3.6  CL 98* 103 104  CO2 25 26 23   GLUCOSE 127* 105* 97  BUN 11 12 8   CREATININE 1.30* 1.32* 1.01  CALCIUM 9.6 8.9 8.7*   Liver Function Tests:  Recent Labs Lab 11/14/14 2054 11/15/14 0900 11/16/14 0549  AST 69* 144* 88*  ALT 50 150* 144*  ALKPHOS 41 36* 40  BILITOT 2.2* 1.8* 1.9*  PROT 6.6 5.8* 5.5*  ALBUMIN 4.1 3.4* 3.1*    Recent Labs Lab 11/14/14 0021 11/14/14 2054 11/15/14 0900 11/16/14 0549  LIPASE  --  >3000* 384* 152*  AMYLASE 1501*  --   --   --    No results for input(s): AMMONIA in the last 168 hours. CBC:  Recent Labs Lab 11/14/14 2054 11/15/14 0900  WBC 13.0* 9.6  NEUTROABS  --  7.9*  HGB 14.9 12.9*  HCT 43.2 38.5*  MCV 93.7 93.9  PLT 199 150   Cardiac Enzymes:  Recent Labs Lab 11/15/14 0300 11/15/14 0900 11/15/14 1450  TROPONINI <0.03 <0.03 <0.03   BNP (last 3 results) No results for input(s): BNP in the last 8760 hours.  ProBNP (last 3 results) No results for input(s): PROBNP in the last 8760 hours.  CBG:  Recent Labs Lab 11/15/14 0534 11/16/14 0031 11/16/14 0603  GLUCAP 111* 99 99    No results found for this or any  previous visit (from the past 240 hour(s)).   Studies: Ct Abdomen Pelvis W Contrast  11/15/2014   CLINICAL DATA:  Right upper quadrant pain to the right flank with nausea. Initial encounter.  EXAM: CT ABDOMEN AND PELVIS WITH CONTRAST  TECHNIQUE: Multidetector CT imaging of the abdomen and pelvis was performed using the standard protocol following bolus administration of intravenous contrast.  CONTRAST:  11mL OMNIPAQUE IOHEXOL 300 MG/ML  SOLN  COMPARISON:  08/09/2014  FINDINGS: BODY WALL: Fatty inguinal hernias, greater on the left. Periumbilical hernia containing noninflamed small bowel  LOWER CHEST: Small sliding hiatal hernia.  Dual-chamber  pacer leads noted.  Coronary atherosclerosis.  ABDOMEN/PELVIS:  Liver: Low densities in the liver are most consistent with cysts. A cyst near the gallbladder fossa has decompressed since 2007.  Biliary: Fall gallbladder without inflammatory change. Chronic enlargement of the common bile duct up to 13 mm. No calcified stone.  Pancreas: Subtle haziness of fat around the pancreas.  Spleen: Unremarkable.  Adrenals: Bilateral nodular adrenal thickening with low-density appearance on prior noncontrast CT consistent with adenomas. Dominant on the right measures 12 mm and on the left 16 mm.  Kidneys and ureters: No hydronephrosis or stone. 27 mm right renal cyst.  Bladder: Unremarkable.  Reproductive: Mild enlargement of the prostate.  Bowel: No obstruction. No appendicitis. Incidental 3 cm submucosal lipoma in the cecum  Retroperitoneum: Enlarged peripancreatic and porta hepatis lymph nodes are stable since 2007 considered reactive.  Peritoneum: No ascites or pneumoperitoneum.  Vascular: 33 mm infrarenal aortic aneurysm. No change from prior based on my measurements. No acute vascular findings.  OSSEOUS: Bilateral L5 pars defects with borderline grade 2 anterolisthesis and biforaminal stenosis with L5 impingement.  IMPRESSION: 1. Subtle if any pancreatitis findings to correlate with elevated lipase. 2. Dilated common bile duct. Although no calcified stone on today's scan, calcified stone in the distal CBD likely present on 08/09/2014 scan. Biliary pancreatitis is suspected. 3. Umbilical hernia containing small bowel without obstruction. 4. Bilateral adrenal adenomas. 5. Aneurysmal infrarenal aorta up to 33 mm. Recommend followup by ultrasound in 3 years. This recommendation follows ACR consensus guidelines: White Paper of the ACR Incidental Findings Committee II on Vascular Findings. Natasha [facility] Coll Radiol 2013; 10:789-794   Electronically Signed   By: Monte Fantasia M.D.   On: 11/15/2014 00:32   US Abdomen  Limited  11/15/2014   CLINICAL DATA:  Initial evaluation for acute right upper quadrant pain.  EXAM: US ABDOMEN LIMITED - RIGHT UPPER QUADRANT  COMPARISON:  Prior CT from earlier the same day.  FINDINGS: Gallbladder:  No gallstones or wall thickening visualized. No sonographic Murphy sign noted.  Common bile duct:  Diameter: 6.6 mm  Liver:  No focal lesion identified. Within normal limits in parenchymal echogenicity.  IMPRESSION: Normal right upper quadrant ultrasound with no sonographic evidence for cholelithiasis, acute cholecystitis, or biliary dilatation.   Electronically Signed   By: Jeannine Boga M.D.   On: 11/15/2014 00:46    Scheduled Meds: . amiodarone  200 mg Oral Daily  . aspirin EC  81 mg Oral Daily  . atorvastatin  80 mg Oral Daily  . benazepril  10 mg Oral Daily  . budesonide-formoterol  2 puff Inhalation BID  . busPIRone  7.5 mg Oral BID  . carvedilol  12.5 mg Oral BID WC  . fenofibrate  160 mg Oral Daily  . gabapentin  300 mg Oral BID  . ipratropium-albuterol  3 mL Inhalation QID  . pantoprazole  40 mg  Oral Daily  . PARoxetine  40 mg Oral q morning - 10a  . tamsulosin  0.4 mg Oral QPC supper  . tiotropium  18 mcg Inhalation Daily   Continuous Infusions: . sodium chloride 75 mL/hr at 11/16/14 0059    Active Problems:   CAD (coronary artery disease)   Essential hypertension   Chronic systolic heart failure   COPD GOLD GRADE C   Acute biliary pancreatitis    Time spent: >35 minutes     Kinnie Feil  [facility] Pager [phone]. If 7PM-7AM, please contact night-coverage at [email or web address], password [facility] 11/16/2014, 11:04 AM  LOS: 1 day

## 2014-11-16 NOTE — Op Note (Addendum)
[facility] [place], [id number]   ERCP PROCEDURE REPORT  PATIENT: Steven Guzman, Steven Guzman  MR#: [id number] BIRTHDATE: Dec 29, 1940  GENDER: male  ENDOSCOPIST: [name], MD REFERRED BY:  PROCEDURE DATE:  11/16/2014 PROCEDURE:   ERCP with sphincterotomy/papillotomy and ERCP with removal of calculus/calculi INDICATIONS:suspected or rule out bile duct stones.  MEDICATIONS: Per Anesthesia TOPICAL ANESTHETIC:  DESCRIPTION OF PROCEDURE:     Physical exam was performed.  Informed consent was obtained from the patient after explaining the benefits, risks, and alternatives to the procedure.  The patient was connected to the monitor and placed in the semi-prone position. IV medicine was administered through an indwelling cannula and oxygen via endotracheal tube.  After administration of sedation, the patients esophagus was intubated and the     endoscope was advanced under direct visualization to the second portion of the duodenum.  The bile duct was successfully cannulated using the [place] with a 0.25 inch wire.  A non-occlusive cholangiogram was performed.  The right and left intrahepatic ducts were normal.  The cystic duct remnant was visualized and a blush of dye was seen in the right upper quadrant consistent with a bile leak.  The common duct was approximately 51mm.  No filling defects were appreciated.  The sphincterotome was withdrawn and a sphincterotomy was performed using blended current .  No bleeding was noted.  The sphincterotome was then removed and an 8.5 French/7 cm biliary stent was placed with return of good bile.  The pancreatic duct was not cannulated intentionally.  The patient was recovered in the ICU and discharged to their floor bed in satisfactory condition.  Estimated blood loss is zero unless otherwise noted in this procedure report.  There was a small periampullary diverticulum.   Common bile  duct was selectively cannulated with a 0.35 mm guidewire.  no frank filling defects were seen.  one small stone was seen exiting the papilla after sphincterotomy.  A 15 mm sphincterotomy cut was made.  There was no bleeding.  The duct was swept multiple times with a 15 mm balloon stone extractor. no further stones were seen.  final cholangiogram was normal.  2 small stones were seen in the cystic duct.  The pancreatic duct was not cannulated.    The scope was then completely withdrawn from the patient and the procedure terminated.    COMPLICATIONS:    None  ENDOSCOPIC IMPRESSION: 1.   choledocholithiasis 2.   small cystic duct stones  RECOMMENDATIONS: Elective cholecystectomy    _______________________________ eSigned:  [name], MD 11/16/2014 2:26 PM Revised: 11/16/2014 2:26 PM  cc: Dr. Vertell Novak

## 2014-11-16 NOTE — Anesthesia Postprocedure Evaluation (Signed)
  Anesthesia Post-op Note  Patient: Steven Guzman  Procedure(s) Performed: Procedure(s): ENDOSCOPIC RETROGRADE CHOLANGIOPANCREATOGRAPHY (ERCP) (N/A)  Patient Location: PACU  Anesthesia Type:General  Level of Consciousness: awake, alert , oriented and patient cooperative, conversant  Airway and Oxygen Therapy: Patient Spontanous Breathing and Patient connected to nasal cannula oxygen  Post-op Pain: none  Post-op Assessment: Post-op Vital signs reviewed, Patient's Cardiovascular Status Stable, Respiratory Function Stable, Patent Airway, No signs of Nausea or vomiting and Pain level controlled              Post-op Vital Signs: Reviewed and stable  Last Vitals:  Filed Vitals:   11/16/14 1600  BP: 97/60  Pulse: 66  Temp:   Resp: 27    Complications: pt with known VT (has AICD), with post op episode of VT requiring defib x5. successful resusc, cardiology to monitor in ICU, [employer] has verified device functioning normally.

## 2014-11-16 NOTE — H&P (View-Only) (Signed)
[facility] Consult: 1:13 PM 11/15/2014  LOS: 0 days    Referring Provider: Dr Daleen Bo  Primary Care Physician:  Olga Millers, MD Primary Gastroenterologist:  Dr. Henrene Pastor   Reason for Consultation:  Acute pancreatitis.    HPI: Steven Guzman is a 74 y.o. male.  PMH ischemic CM (EF 35% by echo 09/09/14), s/p ICD (2011), recurrent V tach (admission for this 09/08/14), AAA, COPD (still smokes, not on oxygen at home), CKD, GERD, HH, colonic diverticulosis, prostamegaly.    EGD 2013 in [place] showed what sounds like esophagitis Colonoscopy 2013 in [place] with possible colon polyps.    Seen once on 10/08/14 by Dr Henrene Pastor for hx of limited episodes of severe RUQ pain on 2 occasions in the last 12 months, lastest on 08/09/14.  Non-contrast CT scan was unrevealing ("Unenhanced liver shows no biliary ductal dilatation"). LFTs normal, Lipase not obtained.  Ultrasound 10/19/14: no GB abnormality, 7 mm CBD,stable simple cyst of right lobe liver, stable isoechoic right hepatic lobe nodule, stable 3.6 cm AAA.  Dr Henrene Pastor added sublingual Levsin prn, continued PPI.   Pt admitted yesterday from ED with abd pain, n/v. Sxs began 5 PM yesterday.  The pain was located in the right upper quadrant and radiating into his back.  He had bilious nausea and vomiting. He had eaten some toast at about 2 PM.   . patient says this episode was much worse than the previous 2 episodes. Those episodes only lasted 45 minutes at most. He was nauseated with those episodes but never vomited. Because these episodes had resolved on their own, the patient sat at home with his symptoms, figuring they would pass.  However they persisted so he came to the emergency room.  Pain and nausea relieved with meds in ED.  Has had some recurrent pain but no recurrent  nausea or vomiting Lipase >3000,  Down to 384 today.  Transaminases rising: ALT >AST. T bili 2.2 on arrival,1.8 today.  CT ab/pelvis with contrast: subtle pancreatitis, dilated CBD (chronic enlargement) at 33mm, radiologist states " Although no calcified stone on today's scan, calcified stone in the distal CBD likely present on 08/09/2014 scan. Biliary pancreatitis is suspected"    Past Medical History  Diagnosis Date  . Hypertension   . Hyperlipidemia   . COPD (chronic obstructive pulmonary disease)     a. followed by pulmonary, COPD GOLD stage II  . GERD (gastroesophageal reflux disease)   . Diverticulitis   . CAD (coronary artery disease)     a. s/p MI in 1994 with PCI to LAD at that time b. cath 10/2012 demonstrated EF 30%, inferior akinesis with mild hypokinesis of all walls, patent LAD and RCA stents; ostial PDA with 80-90% obstruction with medical therapy recommended   . Depression   . Aneurysm     abdominal <4  . Tobacco abuse   . Ventricular tachycardia     a. 08/2009 s/p BSX E110 Teligen 100 AICD, ser#: [id number];  b. 08/2008 VT req ATP - detection reprogrammed from 160 to 150.  [name] Chronic  systolic CHF (congestive heart failure)   . Hiatal hernia     Past Surgical History  Procedure Laterality Date  . Hernia repair    . Foot surgery      lt  . Hemorrhoid banding    . Retinal detachment surgery      right  . Nose surgery      skin cancer-mohs-skin graft  . Colonoscopy    . Tenolysis Right 12/21/2013    Procedure: TENOLYSIS FLEXOR CARPI RADIALIS ,DEBRIDEMENT RIGHT JOINT WRIST,DEBRIDEMENT SCAPHOTRAPEZIAL TRAPEZOID, REPAIR OF EXTENSOR HOOD;  Surgeon: Daryll Brod, MD;  Location: [facility];  Service: Orthopedics;  Laterality: Right;  . Left heart catheterization with coronary angiogram N/A 11/25/2012    demonstrated EF 30%, inferior akinesis with mild hypokinesis of all walls, patent LAD and RCA stents; ostial PDA with 80-90% obstruction with medical therapy  recommended  . Implantable cardioverter defibrillator implant  09/06/09    BSX dual chamber ICD implanted in [place] for cardiac arrest 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    Prior to Admission medications   Medication Sig Start Date End Date Taking? Authorizing Provider  amiodarone (PACERONE) 200 MG tablet take 1 tablet by mouth once daily 10/12/14  Yes [name], MD  aspirin 81 MG tablet Take 81 mg by mouth daily.   Yes Historical Provider, MD  atorvastatin (LIPITOR) 80 MG tablet Take 1 tablet (80 mg total) by mouth daily. 04/12/14  Yes Belva Crome, MD  benazepril (LOTENSIN) 10 MG tablet Take 1 tablet (10 mg total) by mouth daily. 10/29/14  Yes Belva Crome, MD  budesonide-formoterol [facility]) 160-4.5 MCG/ACT inhaler Take 2 puffs first thing in am and then another 2 puffs about 12 hours later. Patient taking differently: Inhale 2 puffs into the lungs first thing in the am and then inhale another 2 puffs into the lungs about 12 hours later. 02/06/14  Yes Tanda Rockers, MD  busPIRone (BUSPAR) 15 MG tablet Take 7.5 mg by mouth See admin instructions. Take one half tablet in the morning and one half tablet in the evening. 06/04/14  Yes Historical Provider, MD  carvedilol (COREG) 12.5 MG tablet Take 1 tablet (12.5 mg total) by mouth 2 (two) times daily with a meal. 10/29/14  Yes Belva Crome, MD  cetirizine (ZYRTEC) 10 MG tablet Take 10 mg by mouth daily as needed for allergies.   Yes Historical Provider, MD  Choline Fenofibrate (TRILIPIX) 135 MG capsule Take 1 capsule (135 mg total) by mouth daily. 12/25/13  Yes Belva Crome, MD  furosemide (LASIX) 40 MG tablet Take 20 mg by mouth daily.    Yes Historical Provider, MD  gabapentin (NEURONTIN) 100 MG capsule Take 300 mg by mouth 2 (two) times daily.  02/08/14  Yes Historical Provider, MD  hyoscyamine (LEVSIN SL) 0.125 MG SL tablet Place 1 tablet (0.125 mg total) under the tongue every 4 (four) hours as needed. Patient taking differently: Place 0.125 mg under  the tongue every 4 (four) hours as needed for cramping.  10/08/14  Yes [name], MD  Ipratropium-Albuterol (COMBIVENT RESPIMAT) 20-100 MCG/ACT AERS respimat Inhale 1 puff into the lungs 4 (four) times daily. Shortness of breath or wheezing Patient taking differently: Inhale 1 puff into the lungs every 6 (six) hours as needed for wheezing. Shortness of breath or wheezing 09/21/14  Yes Tanda Rockers, MD  nitroGLYCERIN (NITROSTAT) 0.4 MG SL tablet Place 1 tablet (0.4 mg total) under the tongue every 5 (five) minutes  as needed for chest pain. 11/25/12  Yes Belva Crome, MD  omeprazole (PRILOSEC) 20 MG capsule Take 20 mg by mouth 2 (two) times daily.   Yes Historical Provider, MD  PARoxetine (PAXIL) 40 MG tablet take 1 tablet by mouth every morning 07/19/14  Yes Olga Millers, MD  polyethylene glycol (MIRALAX / GLYCOLAX) packet Take 17 g by mouth daily as needed. (CONSTIPATION) 08/13/14  Yes Historical Provider, MD  spironolactone (ALDACTONE) 25 MG tablet Take 0.5 tablets (12.5 mg total) by mouth daily. 09/19/14  Yes Belva Crome, MD  tamsulosin [facility]) 0.4 MG CAPS capsule take 1 capsule by mouth daily AFTER SUPPER 09/24/14  Yes Olga Millers, MD  tiotropium (SPIRIVA) 18 MCG inhalation capsule Place 1 capsule (18 mcg total) into inhaler and inhale daily. 06/25/14  Yes Juanito Doom, MD  varenicline (CHANTIX PAK) 0.5 MG X 11 & 1 MG X 42 tablet Take one 0.5 mg tablet by mouth once daily for 3 days, then increase to one 0.5 mg tablet twice daily for 4 days, then increase to one 1 mg tablet twice daily. 10/18/14  Yes Juanito Doom, MD    Scheduled Meds: . amiodarone  200 mg Oral Daily  . aspirin EC  81 mg Oral Daily  . atorvastatin  80 mg Oral Daily  . benazepril  10 mg Oral Daily  . budesonide-formoterol  2 puff Inhalation BID  . busPIRone  7.5 mg Oral BID  . carvedilol  12.5 mg Oral BID WC  . fenofibrate  160 mg Oral Daily  . gabapentin  300 mg Oral BID  . ipratropium-albuterol  3 mL  Inhalation QID  . pantoprazole  40 mg Oral Daily  . PARoxetine  40 mg Oral q morning - 10a  . tamsulosin  0.4 mg Oral QPC supper  . tiotropium  18 mcg Inhalation Daily   Infusions: . sodium chloride 10 mL/hr at 11/15/14 0300   PRN Meds: acetaminophen **OR** acetaminophen, morphine injection, nitroGLYCERIN, ondansetron **OR** ondansetron (ZOFRAN) IV   Allergies as of 11/14/2014 - Review Complete 11/14/2014  Allergen Reaction Noted  . Sulfa antibiotics Hives 11/23/2012    Family History  Problem Relation Age of Onset  . Heart attack Brother   . CAD Father   . CAD Mother   . Hypertension Mother   . Hypertension Father   . Hypertension Brother   . Stroke Neg Hx     Social History   Social History  . Marital Status: Married    Spouse Name: N/A  . Number of Children: N/A  . Years of Education: N/A   Occupational History  . Retired    Social History Main Topics  . Smoking status: Current Every Day Smoker -- 1.00 packs/day for 55 years    Types: Cigarettes  . Smokeless tobacco: Never Used     Comment: attending the "stop smoking" classes through [employer]  . Alcohol Use: 0.0 oz/week    0 Standard drinks or equivalent per week     Comment: occasionally   . Drug Use: No  . Sexual Activity: Not Currently   Other Topics Concern  . Not on file   Social History Narrative    REVIEW OF SYSTEMS: Constitutional:  Generally doesn't get a lot of exercise.  Compared with March 2016, his weight has dropped about 6 pounds. No falls or profound weakness. ENT:  No nose bleeds Pulm:  Stable DOE. No recent flares of acute dyspnea or cough. CV:  No palpitations,  no LE edema.  No chest pain.  Takes low-dose aspirin for history CAD.  GU:  No hematuria, no frequency.  No dark-colored urine. GI:  Per HPI Heme:  Bruises easily.   Transfusions:  None Neuro:  No headaches, no peripheral tingling or numbness MS:  Takes gabapentin for occipital neuralgia but has not had significant neck  pain in the last several months. Derm:  No pruritus, no rash or sores.  Endocrine:  No sweats or chills.  No polyuria or dysuria Immunization:  Did not inquire. Travel:  None beyond local counties in last few months.    PHYSICAL EXAM: Vital signs in last 24 hours: Filed Vitals:   11/15/14 0819  BP: 110/74  Pulse: 74  Temp: 98.9 F (37.2 C)  Resp: 17   Wt Readings from Last 3 Encounters:  11/15/14 211 lb 6.4 oz (95.89 kg)  10/18/14 214 lb (97.07 kg)  10/15/14 219 lb (99.338 kg)   General: Pleasant, comfortable. Does not look acutely ill. Head:  No swelling, no facial asymmetry. No signs of head trauma.  Eyes:  No scleral icterus, no conjunctival pallor. EOMI. Ears:  Slightly HOH. No hearing aids in place.  Nose:  No congestion, no discharge. Mouth:  Good dentition. Moist, clear mucous membranes. Tongue midline. Neck:  No JVD, no TMG, no masses. Lungs:  Some expiratory wheezing. No cough or dyspnea. Heart: RRR. No MRG. S1/S2 audible. Abdomen:  Soft. ND. Tenderness in left upper quadrant. No guarding or rebound. Small umbilical hernia. Bowel sounds active. No HSM or masses.   Rectal: Deferred   Musc/Skeltl: No joint erythema, swelling or contracture deformity. Slight arthritic changes in the fingers/hands. Extremities:  No CCE.  Neurologic:  Patient is alert, oriented 3. Excellent historian. No tremor. No limb weakness. No tremor Skin: No purpura noted on upper extremities. Tattoos:  None Nodes:  No cervical adenopathy.   Psych:  Pleasant, in good spirits.  Relaxed.   Intake/Output from previous day: 08/17 0701 - 08/18 0700 In: 1030 [I.V.:1030] Out: 200 [Urine:200] Intake/Output this shift: Total I/O In: 0  Out: 500 [Urine:500]  LAB RESULTS:  Recent Labs  11/14/14 2054 11/15/14 0900  WBC 13.0* 9.6  HGB 14.9 12.9*  HCT 43.2 38.5*  PLT 199 150   BMET Lab Results  Component Value Date   NA 137 11/15/2014   NA 135 11/14/2014   NA 138 09/09/2014   K 4.1  11/15/2014   K 4.1 11/14/2014   K 4.2 09/09/2014   CL 103 11/15/2014   CL 98* 11/14/2014   CL 103 09/09/2014   CO2 26 11/15/2014   CO2 25 11/14/2014   CO2 28 09/09/2014   GLUCOSE 105* 11/15/2014   GLUCOSE 127* 11/14/2014   GLUCOSE 101* 09/09/2014   BUN 12 11/15/2014   BUN 11 11/14/2014   BUN 10 09/09/2014   CREATININE 1.32* 11/15/2014   CREATININE 1.30* 11/14/2014   CREATININE 1.12 09/09/2014   CALCIUM 8.9 11/15/2014   CALCIUM 9.6 11/14/2014   CALCIUM 9.1 09/09/2014   LFT  Recent Labs  11/14/14 2054 11/15/14 0900  PROT 6.6 5.8*  ALBUMIN 4.1 3.4*  AST 69* 144*  ALT 50 150*  ALKPHOS 41 36*  BILITOT 2.2* 1.8*  BILIDIR  --  0.7*  IBILI  --  1.1*   PT/INR Lab Results  Component Value Date   INR 1.07 09/08/2014   INR 1.04 11/25/2012   Hepatitis Panel No results for input(s): HEPBSAG, HCVAB, HEPAIGM, HEPBIGM in the last 72 hours.  C-Diff No components found for: CDIFF Lipase     Component Value Date/Time   LIPASE 384* 11/15/2014 0900    Drugs of Abuse  No results found for: LABOPIA, COCAINSCRNUR, LABBENZ, AMPHETMU, THCU, LABBARB   RADIOLOGY STUDIES: Ct Abdomen Pelvis W Contrast  11/15/2014   CLINICAL DATA:  Right upper quadrant pain to the right flank with nausea. Initial encounter.  EXAM: CT ABDOMEN AND PELVIS WITH CONTRAST  TECHNIQUE: Multidetector CT imaging of the abdomen and pelvis was performed using the standard protocol following bolus administration of intravenous contrast.  CONTRAST:  112mL OMNIPAQUE IOHEXOL 300 MG/ML  SOLN  COMPARISON:  08/09/2014  FINDINGS: BODY WALL: Fatty inguinal hernias, greater on the left. Periumbilical hernia containing noninflamed small bowel  LOWER CHEST: Small sliding hiatal hernia.  Dual-chamber pacer leads noted.  Coronary atherosclerosis.  ABDOMEN/PELVIS:  Liver: Low densities in the liver are most consistent with cysts. A cyst near the gallbladder fossa has decompressed since 2007.  Biliary: Fall gallbladder without  inflammatory change. Chronic enlargement of the common bile duct up to 13 mm. No calcified stone.  Pancreas: Subtle haziness of fat around the pancreas.  Spleen: Unremarkable.  Adrenals: Bilateral nodular adrenal thickening with low-density appearance on prior noncontrast CT consistent with adenomas. Dominant on the right measures 12 mm and on the left 16 mm.  Kidneys and ureters: No hydronephrosis or stone. 27 mm right renal cyst.  Bladder: Unremarkable.  Reproductive: Mild enlargement of the prostate.  Bowel: No obstruction. No appendicitis. Incidental 3 cm submucosal lipoma in the cecum  Retroperitoneum: Enlarged peripancreatic and porta hepatis lymph nodes are stable since 2007 considered reactive.  Peritoneum: No ascites or pneumoperitoneum.  Vascular: 33 mm infrarenal aortic aneurysm. No change from prior based on my measurements. No acute vascular findings.  OSSEOUS: Bilateral L5 pars defects with borderline grade 2 anterolisthesis and biforaminal stenosis with L5 impingement.  IMPRESSION: 1. Subtle if any pancreatitis findings to correlate with elevated lipase. 2. Dilated common bile duct. Although no calcified stone on today's scan, calcified stone in the distal CBD likely present on 08/09/2014 scan. Biliary pancreatitis is suspected. 3. Umbilical hernia containing small bowel without obstruction. 4. Bilateral adrenal adenomas. 5. Aneurysmal infrarenal aorta up to 33 mm. Recommend followup by ultrasound in 3 years. This recommendation follows ACR consensus guidelines: White Paper of the ACR Incidental Findings Committee II on Vascular Findings. [facility] Radiol 2013; 10:789-794   Electronically Signed   By: Monte Fantasia M.D.   On: 11/15/2014 00:32   US Abdomen Limited  11/15/2014   CLINICAL DATA:  Initial evaluation for acute right upper quadrant pain.  EXAM: US ABDOMEN LIMITED - RIGHT UPPER QUADRANT  COMPARISON:  Prior CT from earlier the same day.  FINDINGS: Gallbladder:  No gallstones or wall  thickening visualized. No sonographic Murphy sign noted.  Common bile duct:  Diameter: 6.6 mm  Liver:  No focal lesion identified. Within normal limits in parenchymal echogenicity.  IMPRESSION: Normal right upper quadrant ultrasound with no sonographic evidence for cholelithiasis, acute cholecystitis, or biliary dilatation.   Electronically Signed   By: Jeannine Boga M.D.   On: 11/15/2014 00:46    ENDOSCOPIC STUDIES: EGD and colonoscopies at outside hospitals per HPI.  Apparently Dr. Henrene Pastor has reviewed these reports. Nothing has been scanned into Epic yet.  IMPRESSION:   *  Acute pancreatitis. Likely biliary. Though CBD dilated, no concrete evidence of CBD stones. ? Undetected CBD stones,   ? ampullary stenosis  *  COPD  *  Cardiomyopathy, CHF, COPD, s/p ICD.   *  Hyperglycemia. Suspect glucose intolerance.  *  Peripheral vascular disease.  AAA, 3.3 cm.     PLAN:     *  ERCP at 1300 tomorrow. Preop antibiotic and postop Indocin ordered. Risks, benefits and technicalities of procedure discussed with patient and his wife using diagrams. They are agreeable to proceed. Ok to have clears until 5 AM tomorrow.   Note that surgical PA is currently consulting with the patient.   Azucena Freed  11/15/2014, 1:13 PM Pager: [phone]  GI Attending Note   Chart was reviewed and patient was examined. X-rays and lab were reviewed.    I agree with management and plans. Strongly suspect pt has a CBD stone.  Bile duct is significantly dilated (perhaps intermittently given ultrasound measurements compared with CT).  Plan to proceed with ERCP.  Unable to do MRCP because of defibrillator.  The risks, benefits, and possible complications of the procedure, including  But not limited to bleeding, perforation, surgery, and the 5-10% risk for pancreatitis, were explained to the patient.  It was explained that  post ERCP pancreatitis which, while usually is mild, occasionally maybe severe and even  life-threatening.  Patient's questions were answered.   Sandy Salaam. Deatra Ina, M.D., [facility] Cell [phone]

## 2014-11-16 NOTE — Progress Notes (Signed)
Pt bp 614 systollic, running 90' to low 709'K systollic. Advised Dr. Daleen Bo on floor OK to hold coreg 12.5 and lotensin 10 mg po this am.

## 2014-11-16 NOTE — Anesthesia Preprocedure Evaluation (Addendum)
Anesthesia Evaluation    Airway Mallampati: I  TM Distance: >3 FB Neck ROM: Full    Dental no notable dental hx. (+) Teeth Intact, Dental Advisory Given   Pulmonary pneumonia -, COPDCurrent Smoker,  breath sounds clear to auscultation  Pulmonary exam normal       Cardiovascular hypertension, Pt. on medications and Pt. on home beta blockers + CAD, + Past MI and +CHF Normal cardiovascular exam+ Cardiac Defibrillator Rhythm:Regular Rate:Normal     Neuro/Psych Seizures -,   Neuromuscular disease    GI/Hepatic hiatal hernia, GERD-  Medicated and Controlled,  Endo/Other    Renal/GU      Musculoskeletal   Abdominal   Peds  Hematology   Anesthesia Other Findings   Reproductive/Obstetrics                           Anesthesia Physical  Anesthesia Plan  ASA: III  Anesthesia Plan: General   Post-op Pain Management:    Induction: Intravenous  Airway Management Planned: Oral ETT  Additional Equipment:   Intra-op Plan:   Post-operative Plan: Extubation in OR  Informed Consent: I have reviewed the patients History and Physical, chart, labs and discussed the procedure including the risks, benefits and alternatives for the proposed anesthesia with the patient or authorized representative who has indicated his/her understanding and acceptance.   Dental advisory given  Plan Discussed with: CRNA  Anesthesia Plan Comments:       Anesthesia Quick Evaluation

## 2014-11-16 NOTE — Telephone Encounter (Signed)
ATC - vmail full, unable to leave voicemail.  Will close this message per Triage protocol as there have been several attempts to contact this patient about his medication.  Called [facility] to see if patient has picked up this Rx - Pharmacy was notified of coverage 11/12/2014 Per pharmacist, patient picked up Chantix 11/12/14. Nothing further needed.

## 2014-11-16 NOTE — Progress Notes (Signed)
A small bile duct stone was extracted following sphincterotomy. There are also small cystic duct stones (versus gallbladder stones).   Recommend elective cholecystectomy

## 2014-11-16 NOTE — Consult Note (Signed)
ELECTROPHYSIOLOGY CONSULT NOTE    Patient ID: LASEAN GORNIAK MRN: [id number], DOB/AGE: 76/04/1940 76 y.o.  Admit date: 11/14/2014 Date of Consult: 11/16/2014  Primary Physician: Olga Millers, MD Primary Cardiologist: Tamala Julian Electrophysiologist: Lovena Le  Reason for Consultation: ventricular tachycardia  HPI:  Steven Guzman is a 74 y.o. male with a past medical history significant for hypertension, COPD, CAD s/p LAD and RCA stents, chronic systolic heart failure, and ventricular tachycardia.  He underwent dual chamber ICD implantation in 2011 while living in [place] at the time of cardiac arrest 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.  He has been maintained on amiodarone for several years and over the last few months has had increasing VT burden.  He had ICD shocks in December of 2015 at which time he was off his medications.  He then presented to the hospital in June of 2016 with sustained ventricular tachycardia below his detection rate and his device was reprogrammed.  I saw him back in the office in follow up last month at which time he was doing well and had not had recurrent VT.  He presented to the hospital on the day of admission with abdominal pain and nausea and vomiting.  He was found to have acute biliary pancreatitis and underwent ERCP today with potential need for cholecystectomy.  He tolerated the procedure well but was waking up from anesthesia when he developed ventricular tachycardia.  His device delivered ATP which failed to terminate the tachycardia and received 3 HV shocks from his device.  He also required external cardioversion.  He reports recent compliance with medications but BB and amiodarone was held this morning. He denies recent chest pain, shortness of breath, fevers, chills, frank syncope or palpitations. EP has been asked to evaluate for treatment options.  Last echo 08/2014 demonstrated EF 35%, LA mildly dilated.  Last cath 2014 demonstrated patent LAD and RCA stents,  ostial PDA with 80-90% obstruction; EF 30% inferior akinesis with mild hypokinesis of all walls.    Past Medical History  Diagnosis Date  . Hypertension   . Hyperlipidemia   . COPD (chronic obstructive pulmonary disease)     a. followed by pulmonary, COPD GOLD stage II  . GERD (gastroesophageal reflux disease)   . Diverticulosis of colon 07/2014    noted on CT  . CAD (coronary artery disease)     a. s/p MI in 1994 with PCI to LAD at that time b. cath 10/2012 demonstrated EF 30%, inferior akinesis with mild hypokinesis of all walls, patent LAD and RCA stents; ostial PDA with 80-90% obstruction with medical therapy recommended   . Depression   . Aneurysm     abdominal <4. 3.6 cm on ultrasound 09/2014.   . Tobacco abuse   . Ventricular tachycardia     a. 08/2009 s/p BSX E110 Teligen 100 AICD, ser#: [id number];  b. 08/2008 VT req ATP - detection reprogrammed from 160 to 150.  [name] Chronic systolic CHF (congestive heart failure)     EF 30 to 35 % as of 09/2014.   [name] Hiatal hernia   . Prostate enlargement 07/2014    observed on CT  . Hyperglycemia 10/2012.     Surgical History:  Past Surgical History  Procedure Laterality Date  . Hernia repair    . Foot surgery      lt  . Hemorrhoid banding    . Retinal detachment surgery      right  . Nose surgery      skin  cancer-mohs-skin graft  . Colonoscopy    . Tenolysis Right 12/21/2013    Procedure: TENOLYSIS FLEXOR CARPI RADIALIS ,DEBRIDEMENT RIGHT JOINT WRIST,DEBRIDEMENT SCAPHOTRAPEZIAL TRAPEZOID, REPAIR OF EXTENSOR HOOD;  Surgeon: Daryll Brod, MD;  Location: [facility];  Service: Orthopedics;  Laterality: Right;  . Left heart catheterization with coronary angiogram N/A 11/25/2012    demonstrated EF 30%, inferior akinesis with mild hypokinesis of all walls, patent LAD and RCA stents; ostial PDA with 80-90% obstruction with medical therapy recommended  . Implantable cardioverter defibrillator implant  09/06/09    BSX dual chamber ICD  implanted in [place] for cardiac arrest 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     Prescriptions prior to admission  Medication Sig Dispense Refill Last Dose  . amiodarone (PACERONE) 200 MG tablet take 1 tablet by mouth once daily 90 tablet 0 11/14/2014 at Unknown time  . aspirin 81 MG tablet Take 81 mg by mouth daily.   11/14/2014 at Unknown time  . atorvastatin (LIPITOR) 80 MG tablet Take 1 tablet (80 mg total) by mouth daily. 30 tablet 6 11/13/2014 at Unknown time  . benazepril (LOTENSIN) 10 MG tablet Take 1 tablet (10 mg total) by mouth daily. 90 tablet 3 11/14/2014 at Unknown time  . budesonide-formoterol (SYMBICORT) 160-4.5 MCG/ACT inhaler Take 2 puffs first thing in am and then another 2 puffs about 12 hours later. (Patient taking differently: Inhale 2 puffs into the lungs first thing in the am and then inhale another 2 puffs into the lungs about 12 hours later.) 1 Inhaler 11 11/14/2014 at Unknown time  . busPIRone (BUSPAR) 15 MG tablet Take 7.5 mg by mouth See admin instructions. Take one half tablet in the morning and one half tablet in the evening.  0 11/14/2014 at Unknown time  . carvedilol (COREG) 12.5 MG tablet Take 1 tablet (12.5 mg total) by mouth 2 (two) times daily with a meal. 180 tablet 3 11/14/2014 at Unknown time  . cetirizine (ZYRTEC) 10 MG tablet Take 10 mg by mouth daily as needed for allergies.   11/14/2014 at Unknown time  . Choline Fenofibrate (TRILIPIX) 135 MG capsule Take 1 capsule (135 mg total) by mouth daily. 30 capsule 8 11/13/2014 at Unknown time  . furosemide (LASIX) 40 MG tablet Take 20 mg by mouth daily.    11/14/2014 at Unknown time  . gabapentin (NEURONTIN) 100 MG capsule Take 300 mg by mouth 2 (two) times daily.   4 11/14/2014 at Unknown time  . hyoscyamine (LEVSIN SL) 0.125 MG SL tablet Place 1 tablet (0.125 mg total) under the tongue every 4 (four) hours as needed. (Patient taking differently: Place 0.125 mg under the tongue every 4 (four) hours as needed for cramping. ) 30 tablet 3  Past Week at Unknown time  . Ipratropium-Albuterol (COMBIVENT RESPIMAT) 20-100 MCG/ACT AERS respimat Inhale 1 puff into the lungs 4 (four) times daily. Shortness of breath or wheezing (Patient taking differently: Inhale 1 puff into the lungs every 6 (six) hours as needed for wheezing. Shortness of breath or wheezing) 1 Inhaler 5 11/13/2014 at Unknown time  . nitroGLYCERIN (NITROSTAT) 0.4 MG SL tablet Place 1 tablet (0.4 mg total) under the tongue every 5 (five) minutes as needed for chest pain. 25 tablet 12 unknown at unknown  . omeprazole (PRILOSEC) 20 MG capsule Take 20 mg by mouth 2 (two) times daily.   11/14/2014 at Unknown time  . PARoxetine (PAXIL) 40 MG tablet take 1 tablet by mouth every morning 30 tablet 11 11/14/2014 at  Unknown time  . polyethylene glycol (MIRALAX / GLYCOLAX) packet Take 17 g by mouth daily as needed. (CONSTIPATION)  0 11/14/2014 at Unknown time  . spironolactone (ALDACTONE) 25 MG tablet Take 0.5 tablets (12.5 mg total) by mouth daily. 45 tablet 2 11/14/2014 at Unknown time  . tamsulosin (FLOMAX) 0.4 MG CAPS capsule take 1 capsule by mouth daily AFTER SUPPER 30 capsule 3 11/14/2014 at Unknown time  . tiotropium (SPIRIVA) 18 MCG inhalation capsule Place 1 capsule (18 mcg total) into inhaler and inhale daily. 30 capsule 3 11/13/2014 at Unknown time  . varenicline (CHANTIX PAK) 0.5 MG X 11 & 1 MG X 42 tablet Take one 0.5 mg tablet by mouth once daily for 3 days, then increase to one 0.5 mg tablet twice daily for 4 days, then increase to one 1 mg tablet twice daily. 53 tablet 3 11/14/2014 at Unknown time    Inpatient Medications:  . amiodarone  200 mg Oral Daily  . aspirin EC  81 mg Oral Daily  . atorvastatin  80 mg Oral Daily  . benazepril  10 mg Oral Daily  . budesonide-formoterol  2 puff Inhalation BID  . busPIRone  7.5 mg Oral BID  . carvedilol  12.5 mg Oral BID WC  . fenofibrate  160 mg Oral Daily  . gabapentin  300 mg Oral BID  . ipratropium-albuterol  3 mL Inhalation QID    . pantoprazole  40 mg Oral Daily  . PARoxetine  40 mg Oral q morning - 10a  . tamsulosin  0.4 mg Oral QPC supper  . tiotropium  18 mcg Inhalation Daily    Allergies:  Allergies  Allergen Reactions  . Sulfa Antibiotics Hives    Social History   Social History  . Marital Status: Married    Spouse Name: N/A  . Number of Children: N/A  . Years of Education: N/A   Occupational History  . Retired    Social History Main Topics  . Smoking status: Current Every Day Smoker -- 1.00 packs/day for 55 years    Types: Cigarettes  . Smokeless tobacco: Never Used     Comment: attending the "stop smoking" classes through [place]  . Alcohol Use: 0.0 oz/week    0 Standard drinks or equivalent per week     Comment: occasionally   . Drug Use: No  . Sexual Activity: Not Currently   Other Topics Concern  . Not on file   Social History Narrative     Family History  Problem Relation Age of Onset  . Heart attack Brother   . CAD Father   . CAD Mother   . Hypertension Mother   . Hypertension Father   . Hypertension Brother   . Stroke Neg Hx      Review of Systems: All other systems reviewed and are otherwise negative except as noted above.  Physical Exam: Filed Vitals:   11/16/14 1024 11/16/14 1231 11/16/14 1525 11/16/14 1540  BP: 108/65 120/57 128/75 110/73  Pulse: 62 60 84 72  Temp: 98.4 F (36.9 C) 98.5 F (36.9 C) 98.6 F (37 C)   TempSrc: Oral Oral    Resp:  19 34 25  Height:      Weight:      SpO2: 93% 92% 93% 92%    GEN- The patient is sedated and elderly appearing, alert and oriented x 3 today.   HEENT: normocephalic, atraumatic; sclera clear, conjunctiva pink; hearing intact; oropharynx clear  Lungs- Clear to ausculation bilaterally, normal  work of breathing. No wheezes, rales, rhonchi Heart- Regular rate and rhythm   GI- obese, bowel sounds present Extremities- no clubbing, cyanosis, or edema; DP/PT/radial pulses 1+ bilaterally MS- no significant  deformity or atrophy Skin- warm and dry, no rash or lesion; ICD pocket well healed, +scattered ecchymosis Psych- euthymic mood, full affect Neuro- strength and sensation are intact  Labs:   Lab Results  Component Value Date   WBC 9.6 11/15/2014   HGB 12.9* 11/15/2014   HCT 38.5* 11/15/2014   MCV 93.9 11/15/2014   PLT 150 11/15/2014    Recent Labs Lab 11/16/14 0549  NA 136  K 3.6  CL 104  CO2 23  BUN 8  CREATININE 1.01  CALCIUM 8.7*  PROT 5.5*  BILITOT 1.9*  ALKPHOS 40  ALT 144*  AST 88*  GLUCOSE 97      Radiology/Studies: US Abdomen Complete 10/19/2014   CLINICAL DATA:  Generalized right upper quadrant abdominal pain for the past 6 months  EXAM: ULTRASOUND ABDOMEN COMPLETE  COMPARISON:  Abdominal CT scan of Aug 09, 2014, and limited abdominal ultrasound of April 28, 2014.  FINDINGS: Gallbladder: No gallstones or wall thickening visualized. No sonographic Murphy sign noted.  Common bile duct: Diameter: 7 mm  Liver: The liver exhibits normal echotexture. There is a focal isoechoic structure adjacent to the inferior surface of the right hepatic lobe measuring 2.2 x 2.3 x 2.2 cm. This has been previously demonstrated and appears unchanged there is no intrahepatic ductal dilation.  IVC: No abnormality visualized.  Pancreas: The pancreatic head and body are normal in appearance. The pancreatic tail was obscured by bowel gas.  Spleen: Size and appearance within normal limits.  Right Kidney: Length: 12.7 cm. There is an exophytic mid to lower pole cyst posteriorly measuring 3 cm in greatest dimension.  Left Kidney: Length: 12.7 cm. Echogenicity within normal limits. No mass or hydronephrosis visualized.  Abdominal aorta: There is a fusiform aneurysm involving the mid and distal aorta. The maximal measured measurements are 3.6 cm AP x 3.7 cm transversely.  Other findings: There is no ascites.  IMPRESSION: 1. There is no acute gallbladder pathology. If gallbladder dysfunction is suspected  clinically, a nuclear medicine hepatobiliary scan would be useful. 2. Stable appearing simple cyst in the right hepatic lobe. A subhepatic isoechoic nodule is demonstrated and appears stable. 3. Stable appearing mid and distal abdominal aortic aneurysm with maximal measured diameter 3.6 cm. These measurements are approximate. A dedicated abdominal aortic ultrasound or CT angiogram of the abdominal aorta are recommended.   Electronically Signed   By: David  Martinique M.D.   On: 10/19/2014 11:04   Ct Abdomen Pelvis W Contrast 11/15/2014   CLINICAL DATA:  Right upper quadrant pain to the right flank with nausea. Initial encounter.  EXAM: CT ABDOMEN AND PELVIS WITH CONTRAST  TECHNIQUE: Multidetector CT imaging of the abdomen and pelvis was performed using the standard protocol following bolus administration of intravenous contrast.  CONTRAST:  148mL OMNIPAQUE IOHEXOL 300 MG/ML  SOLN  COMPARISON:  08/09/2014  FINDINGS: BODY WALL: Fatty inguinal hernias, greater on the left. Periumbilical hernia containing noninflamed small bowel  LOWER CHEST: Small sliding hiatal hernia.  Dual-chamber pacer leads noted.  Coronary atherosclerosis.  ABDOMEN/PELVIS:  Liver: Low densities in the liver are most consistent with cysts. A cyst near the gallbladder fossa has decompressed since 2007.  Biliary: Fall gallbladder without inflammatory change. Chronic enlargement of the common bile duct up to 13 mm. No calcified stone.  Pancreas: Subtle  haziness of fat around the pancreas.  Spleen: Unremarkable.  Adrenals: Bilateral nodular adrenal thickening with low-density appearance on prior noncontrast CT consistent with adenomas. Dominant on the right measures 12 mm and on the left 16 mm.  Kidneys and ureters: No hydronephrosis or stone. 27 mm right renal cyst.  Bladder: Unremarkable.  Reproductive: Mild enlargement of the prostate.  Bowel: No obstruction. No appendicitis. Incidental 3 cm submucosal lipoma in the cecum  Retroperitoneum: Enlarged  peripancreatic and porta hepatis lymph nodes are stable since 2007 considered reactive.  Peritoneum: No ascites or pneumoperitoneum.  Vascular: 33 mm infrarenal aortic aneurysm. No change from prior based on my measurements. No acute vascular findings.  OSSEOUS: Bilateral L5 pars defects with borderline grade 2 anterolisthesis and biforaminal stenosis with L5 impingement.  IMPRESSION: 1. Subtle if any pancreatitis findings to correlate with elevated lipase. 2. Dilated common bile duct. Although no calcified stone on today's scan, calcified stone in the distal CBD likely present on 08/09/2014 scan. Biliary pancreatitis is suspected. 3. Umbilical hernia containing small bowel without obstruction. 4. Bilateral adrenal adenomas. 5. Aneurysmal infrarenal aorta up to 33 mm. Recommend followup by ultrasound in 3 years. This recommendation follows ACR consensus guidelines: White Paper of the ACR Incidental Findings Committee II on Vascular Findings. [facility] Radiol 2013; 10:789-794   Electronically Signed   By: Monte Fantasia M.D.   On: 11/15/2014 00:32   JSE:GBTDVV pacing with intrinsic ventricular conduction, rate 60, diffuse STT changes  DEVICE HISTORY: BSX dual chamber ICD implanted 2011 for secondary prevention  Assessment/Plan: 1.  Ventricular tachycardia The patient has had recurrent ventricular tachycardia in the setting of acute biliary pancreatitis requiring ICD shocks.  He has been compliant with amiodarone and BB but both were held this morning. Dr Lovena Le had discussed with the patient VT ablation for recurrent ventricular arrhythmias.  For now, would increase amiodarone to 400mg  bid - LFT's elevated this admission due to biliary pancreatitis. With VT requiring ICD shocks, we will need to increase amiodarone for the short term and follow LFT's clinically.  Will discuss with Dr Rayann Heman appropriateness of repeat catheterization to look for ischemic causes of VT Keep K>3.9, Mg >1.8 No driving x6  months  2.  CAD/ICM Last cath 2014 with 80-90% ostial PDA occlusion He hasn't had recent ischemic symptoms but with increase in VT burden, may need repeat catheterization - will discuss with Dr Rayann Heman  3.  Chronic systolic dysfunction Euvolemic on exam Continue medical therapy  4.  HTN Stable No change required today  Signed, Chanetta Marshall, NP 11/16/2014 3:47 PM  I have seen, examined the patient, and reviewed the above assessment and plan.  Currently comfortable appearing.  Changes to above are made where necessary.   Would increase amiodarone perioperatively.  Once clinically improved, will follow-up with Dr Lovena Le for consideration of ablation.  No ischemic symptoms however further CV risk stratification prior to elective ablation may be reasonable--> will defer to Dr Lovena Le.  Co Sign: Thompson Grayer, MD 11/16/2014 6:09 PM

## 2014-11-16 NOTE — Progress Notes (Signed)
[facility] Progress Note     Subjective: Pt says pain improved.  No N/V.  He's hungry/thirsty since he's NPO.  Pending ERCP today.  Wife at bedside.   Objective: Vital signs in last 24 hours: Temp:  [97.6 F (36.4 C)-99.2 F (37.3 C)] 98.1 F (36.7 C) (08/19 0835) Pulse Rate:  [59-62] 62 (08/19 0835) Resp:  [16-19] 18 (08/19 0835) BP: (94-121)/(54-74) 121/74 mmHg (08/19 0835) SpO2:  [92 %-98 %] 92 % (08/19 0850) Last BM Date: 11/14/14  Intake/Output from previous day: 08/18 0701 - 08/19 0700 In: 1297.8 [P.O.:720; I.V.:577.8] Out: 2650 [Urine:2650] Intake/Output this shift: Total I/O In: 0  Out: 500 [Urine:500]  PE: Gen:  Alert, NAD, pleasant Abd: Soft, mild tenderness, minimal distension, +BS, no HSM, umbilical hernia soft but mildly tender   Lab Results:   Recent Labs  11/14/14 2054 11/15/14 0900  WBC 13.0* 9.6  HGB 14.9 12.9*  HCT 43.2 38.5*  PLT 199 150   BMET  Recent Labs  11/15/14 0900 11/16/14 0549  NA 137 136  K 4.1 3.6  CL 103 104  CO2 26 23  GLUCOSE 105* 97  BUN 12 8  CREATININE 1.32* 1.01  CALCIUM 8.9 8.7*   PT/INR No results for input(s): LABPROT, INR in the last 72 hours. CMP     Component Value Date/Time   NA 136 11/16/2014 0549   K 3.6 11/16/2014 0549   CL 104 11/16/2014 0549   CO2 23 11/16/2014 0549   GLUCOSE 97 11/16/2014 0549   BUN 8 11/16/2014 0549   CREATININE 1.01 11/16/2014 0549   CALCIUM 8.7* 11/16/2014 0549   PROT 5.5* 11/16/2014 0549   ALBUMIN 3.1* 11/16/2014 0549   AST 88* 11/16/2014 0549   ALT 144* 11/16/2014 0549   ALKPHOS 40 11/16/2014 0549   BILITOT 1.9* 11/16/2014 0549   GFRNONAA >60 11/16/2014 0549   GFRAA >60 11/16/2014 0549   Lipase     Component Value Date/Time   LIPASE 152* 11/16/2014 0549       Studies/Results: Ct Abdomen Pelvis W Contrast  11/15/2014   CLINICAL DATA:  Right upper quadrant pain to the right flank with nausea. Initial encounter.  EXAM: CT ABDOMEN AND PELVIS  WITH CONTRAST  TECHNIQUE: Multidetector CT imaging of the abdomen and pelvis was performed using the standard protocol following bolus administration of intravenous contrast.  CONTRAST:  161mL OMNIPAQUE IOHEXOL 300 MG/ML  SOLN  COMPARISON:  08/09/2014  FINDINGS: BODY WALL: Fatty inguinal hernias, greater on the left. Periumbilical hernia containing noninflamed small bowel  LOWER CHEST: Small sliding hiatal hernia.  Dual-chamber pacer leads noted.  Coronary atherosclerosis.  ABDOMEN/PELVIS:  Liver: Low densities in the liver are most consistent with cysts. A cyst near the gallbladder fossa has decompressed since 2007.  Biliary: Fall gallbladder without inflammatory change. Chronic enlargement of the common bile duct up to 13 mm. No calcified stone.  Pancreas: Subtle haziness of fat around the pancreas.  Spleen: Unremarkable.  Adrenals: Bilateral nodular adrenal thickening with low-density appearance on prior noncontrast CT consistent with adenomas. Dominant on the right measures 12 mm and on the left 16 mm.  Kidneys and ureters: No hydronephrosis or stone. 27 mm right renal cyst.  Bladder: Unremarkable.  Reproductive: Mild enlargement of the prostate.  Bowel: No obstruction. No appendicitis. Incidental 3 cm submucosal lipoma in the cecum  Retroperitoneum: Enlarged peripancreatic and porta hepatis lymph nodes are stable since 2007 considered reactive.  Peritoneum: No ascites or pneumoperitoneum.  Vascular: 33 mm infrarenal  aortic aneurysm. No change from prior based on my measurements. No acute vascular findings.  OSSEOUS: Bilateral L5 pars defects with borderline grade 2 anterolisthesis and biforaminal stenosis with L5 impingement.  IMPRESSION: 1. Subtle if any pancreatitis findings to correlate with elevated lipase. 2. Dilated common bile duct. Although no calcified stone on today's scan, calcified stone in the distal CBD likely present on 08/09/2014 scan. Biliary pancreatitis is suspected. 3. Umbilical hernia  containing small bowel without obstruction. 4. Bilateral adrenal adenomas. 5. Aneurysmal infrarenal aorta up to 33 mm. Recommend followup by ultrasound in 3 years. This recommendation follows ACR consensus guidelines: White Paper of the ACR Incidental Findings Committee II on Vascular Findings. [facility] Radiol 2013; 10:789-794   Electronically Signed   By: Monte Fantasia M.D.   On: 11/15/2014 00:32   US Abdomen Limited  11/15/2014   CLINICAL DATA:  Initial evaluation for acute right upper quadrant pain.  EXAM: US ABDOMEN LIMITED - RIGHT UPPER QUADRANT  COMPARISON:  Prior CT from earlier the same day.  FINDINGS: Gallbladder:  No gallstones or wall thickening visualized. No sonographic Murphy sign noted.  Common bile duct:  Diameter: 6.6 mm  Liver:  No focal lesion identified. Within normal limits in parenchymal echogenicity.  IMPRESSION: Normal right upper quadrant ultrasound with no sonographic evidence for cholelithiasis, acute cholecystitis, or biliary dilatation.   Electronically Signed   By: Jeannine Boga M.D.   On: 11/15/2014 00:46    Anti-infectives: Anti-infectives    None       Assessment/Plan Acute biliary pancreatitis -No stones on current scans, but saw stones on previous scan in CBD, because of previous scan radiologist suspected biliary pancreatitis as source. -Can't get MRI to clarify secondary to implantable defib -Will watch labs trend down, lipase already 152, bili up to 1.9 -GI following, pending ERCP today -Will need to determine if patient is a surgical candidate and timing of potential lap chole potentially Saturday. Called cardiology for clearance.  H/o choledocholithiasis  ARF CAD s/p PCI in 10/2012 Ischemic cardiomyopathy - EF 35% COPD    LOS: 1 day    Nat Christen 11/16/2014, 10:11 AM Pager: [phone]

## 2014-11-16 NOTE — Clinical Documentation Improvement (Signed)
Internal Medicine  Can the diagnosis of CKD be further specified?   CKD Stage I - GFR greater than or equal to 90  CKD Stage II - GFR 60-89  CKD Stage III - GFR 30-59  CKD Stage IV - GFR 15-29  CKD Stage V - GFR < 15  ESRD (End Stage Renal Disease)  Other condition  Unable to clinically determine   Supporting Information:  (risk factors, signs and symptoms, diagnostics, treatment) CKD is documented in the medical record. GFR= 52 BUN= 11 Crea= 1.30   Please exercise your independent, professional judgment when responding. A specific answer is not anticipated or expected.   Thank You, [place]

## 2014-11-16 NOTE — Transfer of Care (Signed)
Immediate Anesthesia Transfer of Care Note  Patient: Steven Guzman  Procedure(s) Performed: Procedure(s): ENDOSCOPIC RETROGRADE CHOLANGIOPANCREATOGRAPHY (ERCP) (N/A)  Patient Location: PACU  Anesthesia Type:General  Level of Consciousness: awake, alert , oriented and patient cooperative  Airway & Oxygen Therapy: Patient Spontanous Breathing and Patient connected to face mask oxygen  Post-op Assessment: Report given to RN, Post -op Vital signs reviewed and stable and Patient moving all extremities X 4  Post vital signs: Reviewed and stable  Last Vitals:  Filed Vitals:   11/16/14 1525  BP: 128/75  Pulse: 84  Temp:   Resp: 34    Complications: AICD fired post-extubation. Cardiology called to bedside in PACU and [place] called to assess device.

## 2014-11-17 DIAGNOSIS — K859 Acute pancreatitis without necrosis or infection, unspecified: Secondary | ICD-10-CM | POA: Insufficient documentation

## 2014-11-17 DIAGNOSIS — J449 Chronic obstructive pulmonary disease, unspecified: Secondary | ICD-10-CM

## 2014-11-17 DIAGNOSIS — I25810 Atherosclerosis of coronary artery bypass graft(s) without angina pectoris: Secondary | ICD-10-CM

## 2014-11-17 DIAGNOSIS — I10 Essential (primary) hypertension: Secondary | ICD-10-CM

## 2014-11-17 LAB — BASIC METABOLIC PANEL
Anion gap: 9 (ref 5–15)
BUN: 12 mg/dL (ref 6–20)
CHLORIDE: 102 mmol/L (ref 101–111)
CO2: 21 mmol/L — AB (ref 22–32)
Calcium: 8.5 mg/dL — ABNORMAL LOW (ref 8.9–10.3)
Creatinine, Ser: 1.01 mg/dL (ref 0.61–1.24)
GFR calc non Af Amer: >60 mL/min (ref >60)
Glucose, Bld: 90 mg/dL (ref 65–99)
POTASSIUM: 4.2 mmol/L (ref 3.5–5.1)
SODIUM: 132 mmol/L — AB (ref 135–145)

## 2014-11-17 LAB — HEPATIC FUNCTION PANEL
ALBUMIN: 2.9 g/dL — AB (ref 3.5–5.0)
ALK PHOS: 43 U/L (ref 38–126)
ALT: 98 U/L — AB (ref 17–63)
AST: 40 U/L (ref 15–41)
Bilirubin, Direct: 0.5 mg/dL (ref 0.1–0.5)
Indirect Bilirubin: 0.7 mg/dL (ref 0.3–0.9)
TOTAL PROTEIN: 5.5 g/dL — AB (ref 6.5–8.1)
Total Bilirubin: 1.2 mg/dL (ref 0.3–1.2)

## 2014-11-17 LAB — MAGNESIUM: MAGNESIUM: 1.8 mg/dL (ref 1.7–2.4)

## 2014-11-17 LAB — GLUCOSE, CAPILLARY: Glucose-Capillary: 88 mg/dL (ref 65–99)

## 2014-11-17 NOTE — Progress Notes (Signed)
    Progress Note   Subjective  *No GI complaints except for feeling a bit sore**   Objective  Vital signs in last 24 hours: Temp:  [97.5 F (36.4 C)-98.6 F (37 C)] 97.8 F (36.6 C) (08/20 1120) Pulse Rate:  [55-84] 60 (08/20 0800) Resp:  [16-34] 18 (08/20 0800) BP: (74-128)/(44-75) 107/73 mmHg (08/20 0800) SpO2:  [92 %-99 %] 96 % (08/20 0828) Weight:  [213 lb 13.5 oz (97 kg)-219 lb 8 oz (99.565 kg)] 219 lb 8 oz (99.565 kg) (08/20 0447) Last BM Date: 11/14/14  General: Alert, well-developed,  in NAD Heart:  Regular rate and rhythm; no murmurs Chest: Clear to ascultation bilaterally Abdomen:  Soft, nontender and nondistended. Normal bowel sounds, without guarding, and without rebound.   Extremities:  Without edema. Neurologic:  Alert and  oriented x4; grossly normal neurologically. Psych:  Alert and cooperative. Normal mood and affect.  Intake/Output from previous day: 08/19 0701 - 08/20 0700 In: 3170 [P.O.:820; I.V.:2350] Out: 1300 [Urine:1300] Intake/Output this shift: Total I/O In: 315 [P.O.:240; I.V.:75] Out: -   Lab Results:  Recent Labs  11/14/14 2054 11/15/14 0900  WBC 13.0* 9.6  HGB 14.9 12.9*  HCT 43.2 38.5*  PLT 199 150   BMET  Recent Labs  11/15/14 0900 11/16/14 0549 11/17/14 0640  NA 137 136 132*  K 4.1 3.6 4.2  CL 103 104 102  CO2 26 23 21*  GLUCOSE 105* 97 90  BUN 12 8 12   CREATININE 1.32* 1.01 1.01  CALCIUM 8.9 8.7* 8.5*   LFT  Recent Labs  11/17/14 0640  PROT 5.5*  ALBUMIN 2.9*  AST 40  ALT 98*  ALKPHOS 43  BILITOT 1.2  BILIDIR 0.5  IBILI 0.7   PT/INR No results for input(s): LABPROT, INR in the last 72 hours. Hepatitis Panel No results for input(s): HEPBSAG, HCVAB, HEPAIGM, HEPBIGM in the last 72 hours.  Studies/Results: Dg Ercp Biliary & Pancreatic Ducts  11/16/2014   CLINICAL DATA:  Cozaar pancreatitis and choledocholithiasis.  EXAM: ERCP  TECHNIQUE: Multiple spot images obtained with the fluoroscopic device and  submitted for interpretation post-procedure.  COMPARISON:  Ultrasound on 11/15/2014  FINDINGS: Imaging during ERCP shows cannulation of the common bile duct with balloon sweep maneuver performed. No filling defects identified upon completion.  IMPRESSION: Balloon sweep maneuver performed after common bile duct cannulation to remove calculi.  These images were submitted for radiologic interpretation only. Please see the procedural report for the amount of contrast and the fluoroscopy time utilized.   Electronically Signed   By: Aletta Edouard M.D.   On: 11/16/2014 15:18      Assessment & Plan  *Choledocholitiasis - s/p ERCP/sphincterotomy/stone extraction.   I would hesitate to electively remove his gallbladder in view of cardiac disease.  Should he become symptomatic may consider percutaneous cholecystostomy tube.  Signing off. ** Active Problems:   CAD (coronary artery disease)   Essential hypertension   Chronic systolic heart failure   COPD GOLD GRADE C   Acute biliary pancreatitis   Choledocholithiasis   Cholelithiasis   Ventricular tachyarrhythmia   Acute pancreatitis     LOS: 2 days   Erskine Emery  11/17/2014, 1:41 PM Pager [phone] 8a-5p weekdays Call [phone] weekends, holidays and 5p-8a or per Amion

## 2014-11-17 NOTE — Progress Notes (Signed)
SUBJECTIVE: The patient is doing well today.  At this time, he denies chest pain, shortness of breath, or any new concerns.  [name] amiodarone  400 mg Oral BID  . aspirin EC  81 mg Oral Daily  . atorvastatin  80 mg Oral Daily  . benazepril  10 mg Oral Daily  . budesonide-formoterol  2 puff Inhalation BID  . busPIRone  7.5 mg Oral BID  . carvedilol  12.5 mg Oral BID WC  . fenofibrate  160 mg Oral Daily  . gabapentin  300 mg Oral BID  . ipratropium-albuterol  3 mL Inhalation QID  . pantoprazole  40 mg Oral Daily  . PARoxetine  40 mg Oral q morning - 10a  . tamsulosin  0.4 mg Oral QPC supper  . tiotropium  18 mcg Inhalation Daily   . sodium chloride 75 mL/hr at 11/16/14 1947    OBJECTIVE: Physical Exam: Filed Vitals:   11/17/14 0300 11/17/14 0400 11/17/14 0447 11/17/14 0500  BP: 95/64 99/69  106/70  Pulse: 60 59  60  Temp:  97.6 F (36.4 C)    TempSrc:  Oral    Resp: 16 17  23   Height:      Weight:   99.565 kg (219 lb 8 oz)   SpO2: 96% 96%  98%    Intake/Output Summary (Last 24 hours) at 11/17/14 0604 Last data filed at 11/17/14 0500  Gross per 24 hour  Intake   2820 ml  Output   1500 ml  Net   1320 ml    Telemetry reveals atrial paced rhythm, no further VT  GEN- The patient is well appearing, alert and oriented x 3 today.   Head- normocephalic, atraumatic Eyes-  Sclera clear, conjunctiva pink Ears- hearing intact Oropharynx- clear Neck- supple,   Lungs- Clear to ausculation bilaterally, normal work of breathing Heart- Regular rate and rhythm  GI- soft, distended Extremities- no clubbing, cyanosis, or edema Skin- no rash or lesion Psych- euthymic mood, full affect Neuro- strength and sensation are intact  LABS: Basic Metabolic Panel:  Recent Labs  11/15/14 0900 11/16/14 0549  NA 137 136  K 4.1 3.6  CL 103 104  CO2 26 23  GLUCOSE 105* 97  BUN 12 8  CREATININE 1.32* 1.01  CALCIUM 8.9 8.7*   Liver Function Tests:  Recent Labs  11/15/14 0900  11/16/14 0549  AST 144* 88*  ALT 150* 144*  ALKPHOS 36* 40  BILITOT 1.8* 1.9*  PROT 5.8* 5.5*  ALBUMIN 3.4* 3.1*    Recent Labs  11/15/14 0900 11/16/14 0549  LIPASE 384* 152*   CBC:  Recent Labs  11/14/14 2054 11/15/14 0900  WBC 13.0* 9.6  NEUTROABS  --  7.9*  HGB 14.9 12.9*  HCT 43.2 38.5*  MCV 93.7 93.9  PLT 199 150   Cardiac Enzymes:  Recent Labs  11/15/14 0300 11/15/14 0900 11/15/14 1450  TROPONINI <0.03 <0.03 <0.03    RADIOLOGY:  ASSESSMENT AND PLAN:  Active Problems:   CAD (coronary artery disease)   Essential hypertension   Chronic systolic heart failure   COPD GOLD GRADE C   Acute biliary pancreatitis   Choledocholithiasis   Cholelithiasis   Ventricular tachyarrhythmia  1. Ventricular tachycardia The patient has had recurrent ventricular tachycardia in the setting of acute biliary pancreatitis requiring ICD shocks.  No episodes overnight Continue amiodarone 400mg  BID (LFT's elevated this admission due to biliary pancreatitis). With VT requiring ICD shocks, we will need to increase amiodarone for the  short term and follow LFT's clinically.  Keep K>3.9, Mg >1.8 No driving x6 months  2. CAD/ICM Last cath 2014 with 80-90% ostial PDA occlusion No ischemic symptoms No acute indication for cath  3. Chronic systolic dysfunction Euvolemic on exam Continue medical therapy Caution with IVF  4. HTN Stable No change required today  Ok to transfer to telemetry from my standpoint General cardiology to see over the weekend Follow-up with Dr Lovena Le EP NP in the office in 4 weeks  Electrophysiology team to see as needed  Please call with questions.   Thompson Grayer, MD 11/17/2014 6:04 AM

## 2014-11-17 NOTE — Progress Notes (Addendum)
PROGRESS NOTE  Steven Guzman:[id number] DOB: 1941-03-23 DOA: 11/14/2014 PCP: Olga Millers, MD  Brief history 74 year old male with a history of coronary artery disease status post stenting, ischemic cardiomyopathy with EF 35%, ventricular tachycardia status post ICD placement, and COPD presented with one-day history of nausea, vomiting, right upper quadrant abdominal pain.  CT of the abdomen and pelvis on 11/14/2014 revealed a dilated common bile duct although no calcified stone was seen. He also was noted to have an infrarenal aortic aneurysm measuring 33 mm. There was also signs of pancreatic stranding. Initial lipase was greater than 3000. Gastroenterology was consulted, and the patient underwent ERCP on 11/16/2014. Balloon sweep maneuver performed after common bile duct cannulation and sphincterotomy, and a small bile duct stone was removed.  There was also noted to be small cystic duct stones.  Unfortunately, after the patient woke up from anesthesia, he had a trickle of tachycardia resulting in 3 shocks from his ICD. In addition, he required external cardioversion. Cardiology was consulted and electrophysiology saw the patient. His amiodarone dose was increased. In light of his cardiac events, elective cholecystectomy has been canceled.  Assessment/Plan: Acute pancreatitis -Secondary to past, bile duct stone -Clinically improving -Lipase has improved -Will need elective cholecystectomy at some point, but deferred for now given recent cardiac events -Advance diet as tolerated -saline lock IVF as diet is being advanced -Pain control and antiemetics -Appreciate gastroenterology and general surgery input -11/16/2014 ERCP--common bile duct cannulation and sphincterotomy, and a small bile duct stone was removed Ventricular tachycardia  -Appreciate Cardiology -amiodarone increased -continue to monitor in step down -optimize K -will need ablation at some point -dual  chamber ICD implantation in 2011 while living in [place]  Acute kidney injury  -Secondary to volume depletion  -Baseline creatinine 0.8-1.1  -Continue intravenous fluids  COPD  -Stable presently on 2L  -Continue Spiriva and Symbicort  -Continue duo nebs Ischemic cardiomyopathy/coronary artery disease/Chronic systolic CHF -77/41/2878 echocardiogram EF 35%, apical and inferior wall HK -Continue carvedilol and benazepril  -Last cath 2014 demonstrated patent LAD and RCA stents, ostial PDA with 80-90% obstruction; EF 30% inferior akinesis with mild hypokinesis of all walls.  -Continue aspirin  -daily weights -saline lock IVF as he has gained 8 pounds since admission Hyperlipidemia  -Continue statin  -Continue fenofibrate Anxiety/depression -Continue BuSpar and Paxil  Family Communication:   Wife updated at beside Disposition Plan:   Move out of Stepdown when okay with cardiology         Procedures/Studies: US Abdomen Complete  10/19/2014   CLINICAL DATA:  Generalized right upper quadrant abdominal pain for the past 6 months  EXAM: ULTRASOUND ABDOMEN COMPLETE  COMPARISON:  Abdominal CT scan of Aug 09, 2014, and limited abdominal ultrasound of April 28, 2014.  FINDINGS: Gallbladder: No gallstones or wall thickening visualized. No sonographic Murphy sign noted.  Common bile duct: Diameter: 7 mm  Liver: The liver exhibits normal echotexture. There is a focal isoechoic structure adjacent to the inferior surface of the right hepatic lobe measuring 2.2 x 2.3 x 2.2 cm. This has been previously demonstrated and appears unchanged there is no intrahepatic ductal dilation.  IVC: No abnormality visualized.  Pancreas: The pancreatic head and body are normal in appearance. The pancreatic tail was obscured by bowel gas.  Spleen: Size and appearance within normal limits.  Right Kidney: Length: 12.7 cm. There is an exophytic mid to lower pole cyst posteriorly measuring 3 cm in  greatest dimension.  Left  Kidney: Length: 12.7 cm. Echogenicity within normal limits. No mass or hydronephrosis visualized.  Abdominal aorta: There is a fusiform aneurysm involving the mid and distal aorta. The maximal measured measurements are 3.6 cm AP x 3.7 cm transversely.  Other findings: There is no ascites.  IMPRESSION: 1. There is no acute gallbladder pathology. If gallbladder dysfunction is suspected clinically, a nuclear medicine hepatobiliary scan would be useful. 2. Stable appearing simple cyst in the right hepatic lobe. A subhepatic isoechoic nodule is demonstrated and appears stable. 3. Stable appearing mid and distal abdominal aortic aneurysm with maximal measured diameter 3.6 cm. These measurements are approximate. A dedicated abdominal aortic ultrasound or CT angiogram of the abdominal aorta are recommended.   Electronically Signed   By: Malic Rosten  [place] M.D.   On: 10/19/2014 11:04   Ct Abdomen Pelvis W Contrast  11/15/2014   CLINICAL DATA:  Right upper quadrant pain to the right flank with nausea. Initial encounter.  EXAM: CT ABDOMEN AND PELVIS WITH CONTRAST  TECHNIQUE: Multidetector CT imaging of the abdomen and pelvis was performed using the standard protocol following bolus administration of intravenous contrast.  CONTRAST:  15mL OMNIPAQUE IOHEXOL 300 MG/ML  SOLN  COMPARISON:  08/09/2014  FINDINGS: BODY WALL: Fatty inguinal hernias, greater on the left. Periumbilical hernia containing noninflamed small bowel  LOWER CHEST: Small sliding hiatal hernia.  Dual-chamber pacer leads noted.  Coronary atherosclerosis.  ABDOMEN/PELVIS:  Liver: Low densities in the liver are most consistent with cysts. A cyst near the gallbladder fossa has decompressed since 2007.  Biliary: Fall gallbladder without inflammatory change. Chronic enlargement of the common bile duct up to 13 mm. No calcified stone.  Pancreas: Subtle haziness of fat around the pancreas.  Spleen: Unremarkable.  Adrenals: Bilateral nodular adrenal thickening with  low-density appearance on prior noncontrast CT consistent with adenomas. Dominant on the right measures 12 mm and on the left 16 mm.  Kidneys and ureters: No hydronephrosis or stone. 27 mm right renal cyst.  Bladder: Unremarkable.  Reproductive: Mild enlargement of the prostate.  Bowel: No obstruction. No appendicitis. Incidental 3 cm submucosal lipoma in the cecum  Retroperitoneum: Enlarged peripancreatic and porta hepatis lymph nodes are stable since 2007 considered reactive.  Peritoneum: No ascites or pneumoperitoneum.  Vascular: 33 mm infrarenal aortic aneurysm. No change from prior based on my measurements. No acute vascular findings.  OSSEOUS: Bilateral L5 pars defects with borderline grade 2 anterolisthesis and biforaminal stenosis with L5 impingement.  IMPRESSION: 1. Subtle if any pancreatitis findings to correlate with elevated lipase. 2. Dilated common bile duct. Although no calcified stone on today's scan, calcified stone in the distal CBD likely present on 08/09/2014 scan. Biliary pancreatitis is suspected. 3. Umbilical hernia containing small bowel without obstruction. 4. Bilateral adrenal adenomas. 5. Aneurysmal infrarenal aorta up to 33 mm. Recommend followup by ultrasound in 3 years. This recommendation follows ACR consensus guidelines: White Paper of the ACR Incidental Findings Committee II on Vascular Findings. [facility] Radiol 2013; 10:789-794   Electronically Signed   By: Monte Fantasia M.D.   On: 11/15/2014 00:32   US Abdomen Limited  11/15/2014   CLINICAL DATA:  Initial evaluation for acute right upper quadrant pain.  EXAM: US ABDOMEN LIMITED - RIGHT UPPER QUADRANT  COMPARISON:  Prior CT from earlier the same day.  FINDINGS: Gallbladder:  No gallstones or wall thickening visualized. No sonographic Murphy sign noted.  Common bile duct:  Diameter: 6.6 mm  Liver:  No focal lesion  identified. Within normal limits in parenchymal echogenicity.  IMPRESSION: Normal right upper quadrant ultrasound  with no sonographic evidence for cholelithiasis, acute cholecystitis, or biliary dilatation.   Electronically Signed   By: Jeannine Boga M.D.   On: 11/15/2014 00:46   Dg Ercp Biliary & Pancreatic Ducts  11/16/2014   CLINICAL DATA:  Cozaar pancreatitis and choledocholithiasis.  EXAM: ERCP  TECHNIQUE: Multiple spot images obtained with the fluoroscopic device and submitted for interpretation post-procedure.  COMPARISON:  Ultrasound on 11/15/2014  FINDINGS: Imaging during ERCP shows cannulation of the common bile duct with balloon sweep maneuver performed. No filling defects identified upon completion.  IMPRESSION: Balloon sweep maneuver performed after common bile duct cannulation to remove calculi.  These images were submitted for radiologic interpretation only. Please see the procedural report for the amount of contrast and the fluoroscopy time utilized.   Electronically Signed   By: Aletta Edouard M.D.   On: 11/16/2014 15:18        Subjective: Patient complains of some dyspnea on exertion. Denies any fevers, chills, chest pain, shortness breath at rest, nausea, vomiting, diarrhea, abdominal pain. He had a bowel movement today. Denies any headache or dizziness.  Objective: Filed Vitals:   11/17/14 0751 11/17/14 0800 11/17/14 0828 11/17/14 1120  BP:  107/73    Pulse:  60    Temp: 98 F (36.7 C)   97.8 F (36.6 C)  TempSrc: Oral   Oral  Resp:  18    Height:      Weight:      SpO2:  96% 96%     Intake/Output Summary (Last 24 hours) at 11/17/14 1126 Last data filed at 11/17/14 0800  Gross per 24 hour  Intake   3485 ml  Output    800 ml  Net   2685 ml   Weight change:  Exam:   General:  Pt is alert, follows commands appropriately, not in acute distress  HEENT: No icterus, No thrush, No neck mass, [place]/AT  Cardiovascular: RRR, S1/S2, no rubs, no gallops  Respiratory: Diminished breath sounds bilateral. Bibasilar rales. No wheezing.  Abdomen: Soft/+BS, non tender, non  distended, no guarding; no hepatosplenomegaly  Extremities: No edema, No lymphangitis, No petechiae, No rashes, no synovitis; no cyanosis or clubbing  Data Reviewed: Basic Metabolic Panel:  Recent Labs Lab 11/14/14 2054 11/15/14 0900 11/16/14 0549 11/17/14 0640  NA 135 137 136 132*  K 4.1 4.1 3.6 4.2  CL 98* 103 104 102  CO2 25 26 23  21*  GLUCOSE 127* 105* 97 90  BUN 11 12 8 12   CREATININE 1.30* 1.32* 1.01 1.01  CALCIUM 9.6 8.9 8.7* 8.5*  MG  --   --   --  1.8   Liver Function Tests:  Recent Labs Lab 11/14/14 2054 11/15/14 0900 11/16/14 0549 11/17/14 0640  AST 69* 144* 88* 40  ALT 50 150* 144* 98*  ALKPHOS 41 36* 40 43  BILITOT 2.2* 1.8* 1.9* 1.2  PROT 6.6 5.8* 5.5* 5.5*  ALBUMIN 4.1 3.4* 3.1* 2.9*    Recent Labs Lab 11/14/14 0021 11/14/14 2054 11/15/14 0900 11/16/14 0549  LIPASE  --  >3000* 384* 152*  AMYLASE 1501*  --   --   --    No results for input(s): AMMONIA in the last 168 hours. CBC:  Recent Labs Lab 11/14/14 2054 11/15/14 0900  WBC 13.0* 9.6  NEUTROABS  --  7.9*  HGB 14.9 12.9*  HCT 43.2 38.5*  MCV 93.7 93.9  PLT 199 150  Cardiac Enzymes:  Recent Labs Lab 11/15/14 0300 11/15/14 0900 11/15/14 1450  TROPONINI <0.03 <0.03 <0.03   BNP: Invalid input(s): POCBNP CBG:  Recent Labs Lab 11/15/14 0534 11/16/14 0031 11/16/14 0603 11/16/14 2332 11/17/14 0532  GLUCAP 111* 99 99 86 88    No results found for this or any previous visit (from the past 240 hour(s)).   Scheduled Meds: . amiodarone  400 mg Oral BID  . aspirin EC  81 mg Oral Daily  . atorvastatin  80 mg Oral Daily  . benazepril  10 mg Oral Daily  . budesonide-formoterol  2 puff Inhalation BID  . busPIRone  7.5 mg Oral BID  . carvedilol  12.5 mg Oral BID WC  . fenofibrate  160 mg Oral Daily  . gabapentin  300 mg Oral BID  . ipratropium-albuterol  3 mL Inhalation QID  . pantoprazole  40 mg Oral Daily  . PARoxetine  40 mg Oral q morning - 10a  . tamsulosin  0.4  mg Oral QPC supper  . tiotropium  18 mcg Inhalation Daily   Continuous Infusions: . sodium chloride 75 mL/hr at 11/16/14 1947     Sahir Tolson, DO  [facility] Pager [phone]  If 7PM-7AM, please contact night-coverage [email or web address] Password [email or web address] 11/17/2014, 11:26 AM   LOS: 2 days

## 2014-11-17 NOTE — Progress Notes (Signed)
1 Day Post-Op  Subjective: Patient resting comfortably Have noted cardiac events from yesterday.  Objective: Vital signs in last 24 hours: Temp:  [97.5 F (36.4 C)-98.6 F (37 C)] 98 F (36.7 C) (08/20 0751) Pulse Rate:  [55-84] 59 (08/20 0700) Resp:  [16-34] 21 (08/20 0700) BP: (74-128)/(44-75) 117/73 mmHg (08/20 0700) SpO2:  [92 %-99 %] 97 % (08/20 0700) Weight:  [97 kg (213 lb 13.5 oz)-99.565 kg (219 lb 8 oz)] 99.565 kg (219 lb 8 oz) (08/20 0447) Last BM Date: 11/14/14  Intake/Output from previous day: 08/19 0701 - 08/20 0700 In: 3170 [P.O.:820; I.V.:2350] Out: 1300 [Urine:1300] Intake/Output this shift:    General appearance: alert, cooperative and no distress GI: soft, non-tender; reducible umbilical hernia  Lab Results:   Recent Labs  11/14/14 2054 11/15/14 0900  WBC 13.0* 9.6  HGB 14.9 12.9*  HCT 43.2 38.5*  PLT 199 150   BMET  Recent Labs  11/16/14 0549 11/17/14 0640  NA 136 132*  K 3.6 4.2  CL 104 102  CO2 23 21*  GLUCOSE 97 90  BUN 8 12  CREATININE 1.01 1.01  CALCIUM 8.7* 8.5*   PT/INR No results for input(s): LABPROT, INR in the last 72 hours. ABG No results for input(s): PHART, HCO3 in the last 72 hours.  Invalid input(s): PCO2, PO2  Studies/Results: Dg Ercp Biliary & Pancreatic Ducts  11/16/2014   CLINICAL DATA:  Cozaar pancreatitis and choledocholithiasis.  EXAM: ERCP  TECHNIQUE: Multiple spot images obtained with the fluoroscopic device and submitted for interpretation post-procedure.  COMPARISON:  Ultrasound on 11/15/2014  FINDINGS: Imaging during ERCP shows cannulation of the common bile duct with balloon sweep maneuver performed. No filling defects identified upon completion.  IMPRESSION: Balloon sweep maneuver performed after common bile duct cannulation to remove calculi.  These images were submitted for radiologic interpretation only. Please see the procedural report for the amount of contrast and the fluoroscopy time utilized.    Electronically Signed   By: Aletta Edouard M.D.   On: 11/16/2014 15:18    Anti-infectives: Anti-infectives    Start     Dose/Rate Route Frequency Ordered Stop   11/16/14 1245  Ampicillin-Sulbactam (UNASYN) 3 g in sodium chloride 0.9 % 100 mL IVPB     3 g 100 mL/hr over 60 Minutes Intravenous To [facility]Stay [facility] 11/16/14 1228 11/16/14 1335      Assessment/Plan: s/p Procedure(s): ENDOSCOPIC RETROGRADE CHOLANGIOPANCREATOGRAPHY (ERCP) (N/A) The patient would likely benefit from laparoscopic cholecystectomy to prevent further episodes, but in light of his cardiac events, this may be unnecessarily risky.  Would advance diet as tolerated.  Please call us back if needed this admission.  No need for follow-up as outpatient unless cardiology feels that patient would be a good candidate for elective surgery.   LOS: 2 days    Glora Hulgan K. 11/17/2014

## 2014-11-18 DIAGNOSIS — I5043 Acute on chronic combined systolic (congestive) and diastolic (congestive) heart failure: Secondary | ICD-10-CM

## 2014-11-18 DIAGNOSIS — I5023 Acute on chronic systolic (congestive) heart failure: Secondary | ICD-10-CM

## 2014-11-18 LAB — COMPREHENSIVE METABOLIC PANEL
ALBUMIN: 2.9 g/dL — AB (ref 3.5–5.0)
ALK PHOS: 46 U/L (ref 38–126)
ALT: 78 U/L — AB (ref 17–63)
AST: 30 U/L (ref 15–41)
Anion gap: 7 (ref 5–15)
BILIRUBIN TOTAL: 0.9 mg/dL (ref 0.3–1.2)
BUN: 10 mg/dL (ref 6–20)
CO2: 26 mmol/L (ref 22–32)
CREATININE: 1.03 mg/dL (ref 0.61–1.24)
Calcium: 9 mg/dL (ref 8.9–10.3)
Chloride: 105 mmol/L (ref 101–111)
GFR calc Af Amer: >60 mL/min (ref >60)
GLUCOSE: 108 mg/dL — AB (ref 65–99)
POTASSIUM: 4.4 mmol/L (ref 3.5–5.1)
Sodium: 138 mmol/L (ref 135–145)
TOTAL PROTEIN: 5.9 g/dL — AB (ref 6.5–8.1)

## 2014-11-18 LAB — CBC
HEMATOCRIT: 36.2 % — AB (ref 39.0–52.0)
Hemoglobin: 12.1 g/dL — ABNORMAL LOW (ref 13.0–17.0)
MCH: 31.3 pg (ref 26.0–34.0)
MCHC: 33.4 g/dL (ref 30.0–36.0)
MCV: 93.5 fL (ref 78.0–100.0)
PLATELETS: 154 10*3/uL (ref 150–400)
RBC: 3.87 MIL/uL — ABNORMAL LOW (ref 4.22–5.81)
RDW: 14.3 % (ref 11.5–15.5)
WBC: 7 10*3/uL (ref 4.0–10.5)

## 2014-11-18 LAB — MAGNESIUM: Magnesium: 1.8 mg/dL (ref 1.7–2.4)

## 2014-11-18 MED ORDER — FUROSEMIDE 10 MG/ML IJ SOLN
INTRAMUSCULAR | Status: AC
  Administered 2014-11-18: 40 mg via INTRAVENOUS
  Filled 2014-11-18: qty 4

## 2014-11-18 MED ORDER — SPIRONOLACTONE 25 MG PO TABS
12.5000 mg | ORAL_TABLET | Freq: Every day | ORAL | Status: DC
  Administered 2014-11-18 – 2014-11-19 (×2): 12.5 mg via ORAL
  Filled 2014-11-18 (×2): qty 1

## 2014-11-18 MED ORDER — FUROSEMIDE 10 MG/ML IJ SOLN
40.0000 mg | Freq: Two times a day (BID) | INTRAMUSCULAR | Status: DC
  Administered 2014-11-18 – 2014-11-19 (×3): 40 mg via INTRAVENOUS
  Filled 2014-11-18 (×2): qty 4

## 2014-11-18 MED ORDER — POTASSIUM CHLORIDE CRYS ER 20 MEQ PO TBCR
40.0000 meq | EXTENDED_RELEASE_TABLET | Freq: Once | ORAL | Status: AC
  Administered 2014-11-18: 40 meq via ORAL
  Filled 2014-11-18: qty 2

## 2014-11-18 MED ORDER — FUROSEMIDE 20 MG PO TABS
20.0000 mg | ORAL_TABLET | Freq: Every day | ORAL | Status: DC
Start: 2014-11-19 — End: 2014-11-18

## 2014-11-18 MED ORDER — FUROSEMIDE 40 MG PO TABS: 40.0000 mg | ORAL_TABLET | Freq: Once | ORAL | Status: DC

## 2014-11-18 NOTE — Progress Notes (Signed)
PROGRESS NOTE  Steven Guzman HQR:[id number] DOB: Nov 05, 1940 DOA: 11/14/2014 PCP: Olga Millers, MD   Brief history 74 year old male with a history of coronary artery disease status post stenting, ischemic cardiomyopathy with EF 35%, ventricular tachycardia status post ICD placement, and COPD presented with one-day history of nausea, vomiting, right upper quadrant abdominal pain. CT of the abdomen and pelvis on 11/14/2014 revealed a dilated common bile duct although no calcified stone was seen. He also was noted to have an infrarenal aortic aneurysm measuring 33 mm. There was also signs of pancreatic stranding. Initial lipase was greater than 3000. Gastroenterology was consulted, and the patient underwent ERCP on 11/16/2014. Balloon sweep maneuver performed after common bile duct cannulation and sphincterotomy, and a small bile duct stone was removed. There was also noted to be small cystic duct stones. Unfortunately, after the patient woke up from anesthesia, he had a trickle of tachycardia resulting in 3 shocks from his ICD. In addition, he required external cardioversion. Cardiology was consulted and electrophysiology saw the patient. His amiodarone dose was increased. In light of his cardiac events, elective cholecystectomy has been canceled.  Assessment/Plan: Acute pancreatitis -Secondary to past, bile duct stone -Clinically improving -Lipase has improved -Will need elective cholecystectomy at some point, but deferred for now given recent cardiac events -Advance diet as tolerated -saline lock IVF as diet is being advanced -Pain control and antiemetics -Appreciate gastroenterology and general surgery input -11/16/2014 ERCP--common bile duct cannulation and sphincterotomy, and a small bile duct stone was removed Ventricular tachycardia  -Appreciate Cardiology -amiodarone increased -continue to monitor in step down -optimize K -will need ablation at some point -dual  chamber ICD implantation in 2011 while living in [place]  Ischemic cardiomyopathy/coronary artery disease/Acute on Chronic systolic CHF -98/26/4158 echocardiogram EF 35%, apical and inferior wall HK -Continue carvedilol and benazepril  -Last cath 2014 demonstrated patent LAD and RCA stents, ostial PDA with 80-90% obstruction; EF 30% inferior akinesis with mild hypokinesis of all walls.  -Continue aspirin  -daily weights -gained 11 pounds since admission -11/18/14--start IV furosemide Acute kidney injury  -Secondary to volume depletion  -Baseline creatinine 0.8-1.1  -Continue intravenous fluids  COPD  -Stable presently on 2L  -Continue Spiriva and Symbicort  -Continue duo nebs  Hyperlipidemia  -Continue statin  -Continue fenofibrate Anxiety/depression -Continue BuSpar and Paxil  Family Communication: Wife updated at beside Disposition Plan: Transfer to tele   Procedures/Studies: Ct Abdomen Pelvis W Contrast  11/15/2014   CLINICAL DATA:  Right upper quadrant pain to the right flank with nausea. Initial encounter.  EXAM: CT ABDOMEN AND PELVIS WITH CONTRAST  TECHNIQUE: Multidetector CT imaging of the abdomen and pelvis was performed using the standard protocol following bolus administration of intravenous contrast.  CONTRAST:  142mL OMNIPAQUE IOHEXOL 300 MG/ML  SOLN  COMPARISON:  08/09/2014  FINDINGS: BODY WALL: Fatty inguinal hernias, greater on the left. Periumbilical hernia containing noninflamed small bowel  LOWER CHEST: Small sliding hiatal hernia.  Dual-chamber pacer leads noted.  Coronary atherosclerosis.  ABDOMEN/PELVIS:  Liver: Low densities in the liver are most consistent with cysts. A cyst near the gallbladder fossa has decompressed since 2007.  Biliary: Fall gallbladder without inflammatory change. Chronic enlargement of the common bile duct up to 13 mm. No calcified stone.  Pancreas: Subtle haziness of fat around the pancreas.  Spleen: Unremarkable.  Adrenals:  Bilateral nodular adrenal thickening with low-density appearance on prior noncontrast CT consistent with adenomas. Dominant on the right  measures 12 mm and on the left 16 mm.  Kidneys and ureters: No hydronephrosis or stone. 27 mm right renal cyst.  Bladder: Unremarkable.  Reproductive: Mild enlargement of the prostate.  Bowel: No obstruction. No appendicitis. Incidental 3 cm submucosal lipoma in the cecum  Retroperitoneum: Enlarged peripancreatic and porta hepatis lymph nodes are stable since 2007 considered reactive.  Peritoneum: No ascites or pneumoperitoneum.  Vascular: 33 mm infrarenal aortic aneurysm. No change from prior based on my measurements. No acute vascular findings.  OSSEOUS: Bilateral L5 pars defects with borderline grade 2 anterolisthesis and biforaminal stenosis with L5 impingement.  IMPRESSION: 1. Subtle if any pancreatitis findings to correlate with elevated lipase. 2. Dilated common bile duct. Although no calcified stone on today's scan, calcified stone in the distal CBD likely present on 08/09/2014 scan. Biliary pancreatitis is suspected. 3. Umbilical hernia containing small bowel without obstruction. 4. Bilateral adrenal adenomas. 5. Aneurysmal infrarenal aorta up to 33 mm. Recommend followup by ultrasound in 3 years. This recommendation follows ACR consensus guidelines: White Paper of the ACR Incidental Findings Committee II on Vascular Findings. [facility] Radiol 2013; 10:789-794   Electronically Signed   By: Monte Fantasia M.D.   On: 11/15/2014 00:32   US Abdomen Limited  11/15/2014   CLINICAL DATA:  Initial evaluation for acute right upper quadrant pain.  EXAM: US ABDOMEN LIMITED - RIGHT UPPER QUADRANT  COMPARISON:  Prior CT from earlier the same day.  FINDINGS: Gallbladder:  No gallstones or wall thickening visualized. No sonographic Murphy sign noted.  Common bile duct:  Diameter: 6.6 mm  Liver:  No focal lesion identified. Within normal limits in parenchymal echogenicity.   IMPRESSION: Normal right upper quadrant ultrasound with no sonographic evidence for cholelithiasis, acute cholecystitis, or biliary dilatation.   Electronically Signed   By: Jeannine Boga M.D.   On: 11/15/2014 00:46   Dg Ercp Biliary & Pancreatic Ducts  11/16/2014   CLINICAL DATA:  Cozaar pancreatitis and choledocholithiasis.  EXAM: ERCP  TECHNIQUE: Multiple spot images obtained with the fluoroscopic device and submitted for interpretation post-procedure.  COMPARISON:  Ultrasound on 11/15/2014  FINDINGS: Imaging during ERCP shows cannulation of the common bile duct with balloon sweep maneuver performed. No filling defects identified upon completion.  IMPRESSION: Balloon sweep maneuver performed after common bile duct cannulation to remove calculi.  These images were submitted for radiologic interpretation only. Please see the procedural report for the amount of contrast and the fluoroscopy time utilized.   Electronically Signed   By: Aletta Edouard M.D.   On: 11/16/2014 15:18         Subjective:   Objective: Filed Vitals:   11/18/14 0900 11/18/14 0912 11/18/14 0914 11/18/14 0950  BP: 118/72   108/62  Pulse: 60   58  Temp:      TempSrc:      Resp: 30   17  Height:      Weight:      SpO2: 97% 95% 95% 96%    Intake/Output Summary (Last 24 hours) at 11/18/14 1059 Last data filed at 11/18/14 1000  Gross per 24 hour  Intake   2795 ml  Output   6950 ml  Net  -4155 ml   Weight change: 4 kg (8 lb 13.1 oz) Exam:   General:  Pt is alert, follows commands appropriately, not in acute distress  HEENT: No icterus, No thrush, No neck mass, [place]/AT  Cardiovascular: RRR, S1/S2, no rubs, no gallops  Respiratory: CTA bilaterally, no wheezing,  no crackles, no rhonchi  Abdomen: Soft/+BS, non tender, non distended, no guarding  Extremities: No edema, No lymphangitis, No petechiae, No rashes, no synovitis  Data Reviewed: Basic Metabolic Panel:  Recent Labs Lab 11/14/14 2054  11/15/14 0900 11/16/14 0549 11/17/14 0640 11/18/14 0248  NA 135 137 136 132* 138  K 4.1 4.1 3.6 4.2 4.4  CL 98* 103 104 102 105  CO2 25 26 23  21* 26  GLUCOSE 127* 105* 97 90 108*  BUN 11 12 8 12 10   CREATININE 1.30* 1.32* 1.01 1.01 1.03  CALCIUM 9.6 8.9 8.7* 8.5* 9.0  MG  --   --   --  1.8 1.8   Liver Function Tests:  Recent Labs Lab 11/14/14 2054 11/15/14 0900 11/16/14 0549 11/17/14 0640 11/18/14 0248  AST 69* 144* 88* 40 30  ALT 50 150* 144* 98* 78*  ALKPHOS 41 36* 40 43 46  BILITOT 2.2* 1.8* 1.9* 1.2 0.9  PROT 6.6 5.8* 5.5* 5.5* 5.9*  ALBUMIN 4.1 3.4* 3.1* 2.9* 2.9*    Recent Labs Lab 11/14/14 0021 11/14/14 2054 11/15/14 0900 11/16/14 0549  LIPASE  --  >3000* 384* 152*  AMYLASE 1501*  --   --   --    No results for input(s): AMMONIA in the last 168 hours. CBC:  Recent Labs Lab 11/14/14 2054 11/15/14 0900 11/18/14 0248  WBC 13.0* 9.6 7.0  NEUTROABS  --  7.9*  --   HGB 14.9 12.9* 12.1*  HCT 43.2 38.5* 36.2*  MCV 93.7 93.9 93.5  PLT 199 150 154   Cardiac Enzymes:  Recent Labs Lab 11/15/14 0300 11/15/14 0900 11/15/14 1450  TROPONINI <0.03 <0.03 <0.03   BNP: Invalid input(s): POCBNP CBG:  Recent Labs Lab 11/15/14 0534 11/16/14 0031 11/16/14 0603 11/16/14 2332 11/17/14 0532  GLUCAP 111* 99 99 86 88    No results found for this or any previous visit (from the past 240 hour(s)).   Scheduled Meds: . amiodarone  400 mg Oral BID  . aspirin EC  81 mg Oral Daily  . atorvastatin  80 mg Oral Daily  . benazepril  10 mg Oral Daily  . budesonide-formoterol  2 puff Inhalation BID  . busPIRone  7.5 mg Oral BID  . carvedilol  12.5 mg Oral BID WC  . fenofibrate  160 mg Oral Daily  . furosemide  40 mg Intravenous BID  . gabapentin  300 mg Oral BID  . ipratropium-albuterol  3 mL Inhalation QID  . pantoprazole  40 mg Oral Daily  . PARoxetine  40 mg Oral q morning - 10a  . spironolactone  12.5 mg Oral Daily  . tamsulosin  0.4 mg Oral QPC supper   . tiotropium  18 mcg Inhalation Daily   Continuous Infusions:    Hanford Lust, DO  [facility] Pager [phone]  If 7PM-7AM, please contact night-coverage [email or web address] Password [email or web address] 11/18/2014, 10:59 AM   LOS: 3 days

## 2014-11-18 NOTE — Progress Notes (Signed)
Patient ID: Steven Guzman, male   DOB: 09-22-1940, 74 y.o.   MRN: [id number]   SUBJECTIVE: No VT.  Still some abdominal soreness but better.  Eating with no problems.  Main complaint is congestion/dyspnea.  No wheezing.   [name] amiodarone  400 mg Oral BID  . aspirin EC  81 mg Oral Daily  . atorvastatin  80 mg Oral Daily  . benazepril  10 mg Oral Daily  . budesonide-formoterol  2 puff Inhalation BID  . busPIRone  7.5 mg Oral BID  . carvedilol  12.5 mg Oral BID WC  . fenofibrate  160 mg Oral Daily  . furosemide  40 mg Intravenous BID  . gabapentin  300 mg Oral BID  . ipratropium-albuterol  3 mL Inhalation QID  . pantoprazole  40 mg Oral Daily  . PARoxetine  40 mg Oral q morning - 10a  . potassium chloride  40 mEq Oral Once  . spironolactone  12.5 mg Oral Daily  . tamsulosin  0.4 mg Oral QPC supper  . tiotropium  18 mcg Inhalation Daily      OBJECTIVE: Physical Exam: Filed Vitals:   11/18/14 0500 11/18/14 0600 11/18/14 0700 11/18/14 0800  BP: 123/79 125/82 126/60   Pulse: 59 51 59   Temp:    98 F (36.7 C)  TempSrc:    Oral  Resp: 19 21 19    Height:      Weight: 222 lb 10.6 oz (101 kg)     SpO2: 95% 95% 95%     Intake/Output Summary (Last 24 hours) at 11/18/14 6010 Last data filed at 11/18/14 0800  Gross per 24 hour  Intake   2870 ml  Output   5250 ml  Net  -2380 ml    Telemetry reveals atrial paced/v-sensed rhythm, no further VT  GEN- The patient is well appearing, alert and oriented x 3 today.   Neck- supple, JVP 12 cm Lungs- Rhonchi bilaterally Heart- Regular rate and rhythm, no murmur, no S3/S4.  GI- soft, very mild tenderness, non-distended, no hepatosplenomegaly.  Extremities- no clubbing, cyanosis, or edema Skin- no rash or lesion Psych- euthymic mood, full affect Neuro- strength and sensation are intact  LABS: Basic Metabolic Panel:  Recent Labs  11/17/14 0640 11/18/14 0248  NA 132* 138  K 4.2 4.4  CL 102 105  CO2 21* 26  GLUCOSE 90 108*  BUN  12 10  CREATININE 1.01 1.03  CALCIUM 8.5* 9.0  MG 1.8 1.8   Liver Function Tests:  Recent Labs  11/17/14 0640 11/18/14 0248  AST 40 30  ALT 98* 78*  ALKPHOS 43 46  BILITOT 1.2 0.9  PROT 5.5* 5.9*  ALBUMIN 2.9* 2.9*    Recent Labs  11/16/14 0549  LIPASE 152*   CBC:  Recent Labs  11/18/14 0248  WBC 7.0  HGB 12.1*  HCT 36.2*  MCV 93.5  PLT 154   Cardiac Enzymes:  Recent Labs  11/15/14 1450  TROPONINI <0.03    RADIOLOGY:  ASSESSMENT AND PLAN:  Active Problems:   CAD (coronary artery disease)   Essential hypertension   Chronic systolic heart failure   COPD GOLD GRADE C   Acute biliary pancreatitis   Choledocholithiasis   Cholelithiasis   Ventricular tachyarrhythmia   Acute pancreatitis  1. Ventricular tachycardia The patient has had recurrent ventricular tachycardia in the setting of acute biliary pancreatitis requiring ICD shocks. No further episodes. Continue amiodarone 400mg  BID (LFT's elevated this admission due to biliary pancreatitis). With VT  requiring ICD shocks, we will need to increase amiodarone for the short term (400 bid x 1 week then 200 bid until followup with Dr Lovena Le) and follow LFT's clinically (coming down).  - Keep K>3.9, Mg >1.8 - No driving x6 months 2. CAD Last cath 2014 with 80-90% ostial PDA occlusion. No chest pain.  Do not think that VT was due to ischemia. Continue ASA, statin.  3. Acute on chronic systolic CHF Ischemic cardiomyopathy.  He is volume overloaded on exam and short of breath, likely due to fluid received in the setting of acute pancreatitis.   - Continue Coreg, benazepril, spironolactone.  - Lasix 40 mg IV bid, follow K/Mg.  4. Acute biliary pancreatitis Pain improving.  Had ERCP with sphincterotomy and stone removal.  Given improvement in symptoms, would avoid CCY at this time given high cardiac risk.  Cholecystostomy tube would be option with recurrent symptoms this admission.   Ok to transfer to  telemetry from my standpoint Follow-up with Dr Lovena Le EP NP in the office in 2-3 weeks.   Loralie Champagne, MD 11/18/2014 9:05 AM

## 2014-11-19 ENCOUNTER — Encounter (HOSPITAL_COMMUNITY)
Admission: EM | Disposition: A | Discharge status: Home / Self Care | Payer: Medicare Other | Source: Home / Self Care | Attending: Internal Medicine

## 2014-11-19 ENCOUNTER — Encounter (HOSPITAL_COMMUNITY): Payer: Self-pay | Admitting: Gastroenterology

## 2014-11-19 LAB — COMPREHENSIVE METABOLIC PANEL
ALBUMIN: 2.9 g/dL — AB (ref 3.5–5.0)
ALT: 59 U/L (ref 17–63)
ANION GAP: 7 (ref 5–15)
AST: 21 U/L (ref 15–41)
Alkaline Phosphatase: 51 U/L (ref 38–126)
BILIRUBIN TOTAL: 0.7 mg/dL (ref 0.3–1.2)
BUN: 8 mg/dL (ref 6–20)
CHLORIDE: 101 mmol/L (ref 101–111)
CO2: 29 mmol/L (ref 22–32)
Calcium: 8.9 mg/dL (ref 8.9–10.3)
Creatinine, Ser: 1 mg/dL (ref 0.61–1.24)
GFR calc Af Amer: >60 mL/min (ref >60)
GFR calc non Af Amer: >60 mL/min (ref >60)
GLUCOSE: 99 mg/dL (ref 65–99)
POTASSIUM: 3.8 mmol/L (ref 3.5–5.1)
SODIUM: 137 mmol/L (ref 135–145)
TOTAL PROTEIN: 6.1 g/dL — AB (ref 6.5–8.1)

## 2014-11-19 LAB — MAGNESIUM: Magnesium: 1.6 mg/dL — ABNORMAL LOW (ref 1.7–2.4)

## 2014-11-19 SURGERY — LEFT HEART CATH AND CORONARY ANGIOGRAPHY
Anesthesia: LOCAL

## 2014-11-19 MED ORDER — POTASSIUM CHLORIDE CRYS ER 20 MEQ PO TBCR
40.0000 meq | EXTENDED_RELEASE_TABLET | Freq: Once | ORAL | Status: AC
  Administered 2014-11-19: 40 meq via ORAL
  Filled 2014-11-19: qty 2

## 2014-11-19 MED ORDER — MAGNESIUM SULFATE 2 GM/50ML IV SOLN
2.0000 g | Freq: Once | INTRAVENOUS | Status: AC
  Administered 2014-11-19: 2 g via INTRAVENOUS
  Filled 2014-11-19: qty 50

## 2014-11-19 MED ORDER — AMIODARONE HCL 200 MG PO TABS: 400.0000 mg | ORAL_TABLET | Freq: Two times a day (BID) | ORAL | Status: DC

## 2014-11-19 MED ORDER — DOCUSATE SODIUM 100 MG PO CAPS: 100.0000 mg | ORAL_CAPSULE | Freq: Two times a day (BID) | ORAL | Status: DC

## 2014-11-19 MED ORDER — SENNA 8.6 MG PO TABS: 2.0000 | ORAL_TABLET | Freq: Every day | ORAL | Status: DC

## 2014-11-19 NOTE — Discharge Instructions (Signed)
NO Driving for 6 months

## 2014-11-19 NOTE — Discharge Summary (Signed)
Physician Discharge Summary  WYMAN MESCHKE GUY:[id number] DOB: [birth year] DOA: 11/14/2014  PCP: Olga Millers, MD  Admit date: 11/14/2014 Discharge date: 11/19/2014  Recommendations for Outpatient Follow-up:  1. Pt will need to follow up with PCP in 2 weeks post discharge 2. Please obtain BMP in one week Discharge Diagnoses:  Acute pancreatitis -Secondary to passed bile duct stone -Clinically improving--tolerating diet -Lipase has improved -Will need elective cholecystectomy at some point, but deferred for now given recent cardiac events -Advance diet as tolerated -saline lock IVF as diet is being advanced -Pain control and antiemetics -Appreciate gastroenterology and general surgery input -11/16/2014 ERCP--common bile duct cannulation and sphincterotomy, and a small bile duct stone was removed Ventricular tachycardia  -Appreciate Cardiology -amiodarone increased--LFTs trending down -continue to monitor in step down -optimize K -will need ablation at some point -dual chamber ICD implantation in 2011 while living in [place]  -case discussed with Dr. Acie Fredrickson on day of d/c--cleared pt for d/c-->amiodarone 400 mg bid x 5 more days, then 200 mg bid until followup with Dr. Beckie Salts -restart home dose furosemide -instructions given to pt to weigh himself--discharge weight 212 pounds Ischemic cardiomyopathy/coronary artery disease/Acute on Chronic systolic CHF -56/38/7564 echocardiogram EF 35%, apical and inferior wall HK -Continue carvedilol and benazepril  -Last cath 2014 demonstrated patent LAD and RCA stents, ostial PDA with 80-90% obstruction; EF 30% inferior akinesis with mild hypokinesis of all walls.  -Continue aspirin  -daily weights -gained 11 pounds since admission -11/18/14--start IV furosemide--diuresed 5.5 L -8/22--cardiology cleared pt to go home Acute kidney injury  -Secondary to volume depletion  -Baseline creatinine 0.8-1.1  -Continue intravenous  fluids  COPD  -Stable presently on 2L  -Continue Spiriva and Symbicort  -Continue duo nebs  Hyperlipidemia  -Continue statin  -Continue fenofibrate Anxiety/depression -Continue BuSpar and Paxil  Discharge Condition: stable  Disposition: home Follow-up Information    Follow up with Cristopher Peru, MD On 12/12/2014.   Specialty:  Cardiology   Why:  at 1:45PM   Contact information:   [address] [place] [address] [phone]       Diet:heart healthy Wt Readings from Last 3 Encounters:  11/18/14 96.299 kg (212 lb 4.8 oz)  10/18/14 97.07 kg (214 lb)  10/15/14 99.338 kg (219 lb)    History of present illness:  74 year old male with a history of coronary artery disease status post stenting, ischemic cardiomyopathy with EF 35%, ventricular tachycardia status post ICD placement, and COPD presented with one-day history of nausea, vomiting, right upper quadrant abdominal pain. CT of the abdomen and pelvis on 11/14/2014 revealed a dilated common bile duct although no calcified stone was seen. He also was noted to have an infrarenal aortic aneurysm measuring 33 mm. There was also signs of pancreatic stranding. Initial lipase was greater than 3000. Gastroenterology was consulted, and the patient underwent ERCP on 11/16/2014. Balloon sweep maneuver performed after common bile duct cannulation and sphincterotomy, and a small bile duct stone was removed. There was also noted to be small cystic duct stones. Unfortunately, after the patient woke up from anesthesia, he had a trickle of tachycardia resulting in 3 shocks from his ICD. In addition, he required external cardioversion. Cardiology was consulted and electrophysiology saw the patient. His amiodarone dose was increased. In light of his cardiac events, elective cholecystectomy has been canceled.  He developed acute on chronic diastolic CHF from fluid resuscitation.  He was started on IV lasix with good clinical  response.  Consultants: Cardiology  [facility] General surgery  Discharge Exam: Filed Vitals:   11/19/14 0806  BP: 135/89  Pulse: 65  Temp: 98.5 F (36.9 C)  Resp: 17   Filed Vitals:   11/19/14 0500 11/19/14 0806 11/19/14 0812 11/19/14 1125  BP: 126/74 135/89    Pulse: 60 65    Temp: 97.8 F (36.6 C) 98.5 F (36.9 C)    TempSrc: Oral Oral    Resp: 22 17    Height:      Weight:      SpO2: 95% 94% 93% 91%   General: A&O x 3, NAD, pleasant, cooperative Cardiovascular: RRR, no rub, no gallop, no S3; no JVD Respiratory: fine basilar crackles without wheeze Abdomen:soft, nontender, nondistended, positive bowel sounds Extremities: No edema, No lymphangitis, no petechiae  Discharge Instructions      Discharge Instructions    Diet - low sodium heart healthy    Complete by:  As directed      Diet - low sodium heart healthy    Complete by:  As directed      Discharge instructions    Complete by:  As directed   Amiodarone--take 2 tablets two times daily x 5 days, then take 1 tablet two times daily until you follow up with your family doctor Weigh yourself daily--if you gain 3 or more pounds on consecutive days, take lasix 40 mg (1 whole pill)                                    If you gain 5 or more pounds over 3 days, then call your doctor     Increase activity slowly    Complete by:  As directed      Increase activity slowly    Complete by:  As directed             Medication List    TAKE these medications        amiodarone 200 MG tablet  Commonly known as:  PACERONE  Take 2 tablets (400 mg total) by mouth 2 (two) times daily. X 5 days, then 1 tab (200 mg) two times daily     aspirin 81 MG tablet  Take 81 mg by mouth daily.     atorvastatin 80 MG tablet  Commonly known as:  LIPITOR  Take 1 tablet (80 mg total) by mouth daily.     benazepril 10 MG tablet  Commonly known as:  LOTENSIN  Take 1 tablet (10 mg total) by mouth daily.     budesonide-formoterol  160-4.5 MCG/ACT inhaler  Commonly known as:  SYMBICORT  Take 2 puffs first thing in am and then another 2 puffs about 12 hours later.     busPIRone 15 MG tablet  Commonly known as:  BUSPAR  Take 7.5 mg by mouth See admin instructions. Take one half tablet in the morning and one half tablet in the evening.     carvedilol 12.5 MG tablet  Commonly known as:  COREG  Take 1 tablet (12.5 mg total) by mouth 2 (two) times daily with a meal.     cetirizine 10 MG tablet  Commonly known as:  ZYRTEC  Take 10 mg by mouth daily as needed for allergies.     Choline Fenofibrate 135 MG capsule  Commonly known as:  TRILIPIX  Take 1 capsule (135 mg total) by mouth daily.     furosemide 40 MG  tablet  Commonly known as:  LASIX  Take 20 mg by mouth daily.     gabapentin 100 MG capsule  Commonly known as:  NEURONTIN  Take 300 mg by mouth 2 (two) times daily.     hyoscyamine 0.125 MG SL tablet  Commonly known as:  LEVSIN SL  Place 1 tablet (0.125 mg total) under the tongue every 4 (four) hours as needed.     Ipratropium-Albuterol 20-100 MCG/ACT Aers respimat  Commonly known as:  COMBIVENT RESPIMAT  Inhale 1 puff into the lungs 4 (four) times daily. Shortness of breath or wheezing     nitroGLYCERIN 0.4 MG SL tablet  Commonly known as:  NITROSTAT  Place 1 tablet (0.4 mg total) under the tongue every 5 (five) minutes as needed for chest pain.     omeprazole 20 MG capsule  Commonly known as:  PRILOSEC  Take 20 mg by mouth 2 (two) times daily.     PARoxetine 40 MG tablet  Commonly known as:  PAXIL  take 1 tablet by mouth every morning     polyethylene glycol packet  Commonly known as:  MIRALAX / GLYCOLAX  Take 17 g by mouth daily as needed. (CONSTIPATION)     spironolactone 25 MG tablet  Commonly known as:  ALDACTONE  Take 0.5 tablets (12.5 mg total) by mouth daily.     tamsulosin 0.4 MG Caps capsule  Commonly known as:  FLOMAX  take 1 capsule by mouth daily AFTER SUPPER     tiotropium  18 MCG inhalation capsule  Commonly known as:  SPIRIVA  Place 1 capsule (18 mcg total) into inhaler and inhale daily.     varenicline 0.5 MG X 11 & 1 MG X 42 tablet  Commonly known as:  CHANTIX PAK  Take one 0.5 mg tablet by mouth once daily for 3 days, then increase to one 0.5 mg tablet twice daily for 4 days, then increase to one 1 mg tablet twice daily.         The results of significant diagnostics from this hospitalization (including imaging, microbiology, ancillary and laboratory) are listed below for reference.    Significant Diagnostic Studies: Ct Abdomen Pelvis W Contrast  11/15/2014   CLINICAL DATA:  Right upper quadrant pain to the right flank with nausea. Initial encounter.  EXAM: CT ABDOMEN AND PELVIS WITH CONTRAST  TECHNIQUE: Multidetector CT imaging of the abdomen and pelvis was performed using the standard protocol following bolus administration of intravenous contrast.  CONTRAST:  123mL OMNIPAQUE IOHEXOL 300 MG/ML  SOLN  COMPARISON:  08/09/2014  FINDINGS: BODY WALL: Fatty inguinal hernias, greater on the left. Periumbilical hernia containing noninflamed small bowel  LOWER CHEST: Small sliding hiatal hernia.  Dual-chamber pacer leads noted.  Coronary atherosclerosis.  ABDOMEN/PELVIS:  Liver: Low densities in the liver are most consistent with cysts. A cyst near the gallbladder fossa has decompressed since 2007.  Biliary: Fall gallbladder without inflammatory change. Chronic enlargement of the common bile duct up to 13 mm. No calcified stone.  Pancreas: Subtle haziness of fat around the pancreas.  Spleen: Unremarkable.  Adrenals: Bilateral nodular adrenal thickening with low-density appearance on prior noncontrast CT consistent with adenomas. Dominant on the right measures 12 mm and on the left 16 mm.  Kidneys and ureters: No hydronephrosis or stone. 27 mm right renal cyst.  Bladder: Unremarkable.  Reproductive: Mild enlargement of the prostate.  Bowel: No obstruction. No appendicitis.  Incidental 3 cm submucosal lipoma in the cecum  Retroperitoneum: Enlarged peripancreatic  and porta hepatis lymph nodes are stable since 2007 considered reactive.  Peritoneum: No ascites or pneumoperitoneum.  Vascular: 33 mm infrarenal aortic aneurysm. No change from prior based on my measurements. No acute vascular findings.  OSSEOUS: Bilateral L5 pars defects with borderline grade 2 anterolisthesis and biforaminal stenosis with L5 impingement.  IMPRESSION: 1. Subtle if any pancreatitis findings to correlate with elevated lipase. 2. Dilated common bile duct. Although no calcified stone on today's scan, calcified stone in the distal CBD likely present on 08/09/2014 scan. Biliary pancreatitis is suspected. 3. Umbilical hernia containing small bowel without obstruction. 4. Bilateral adrenal adenomas. 5. Aneurysmal infrarenal aorta up to 33 mm. Recommend followup by ultrasound in 3 years. This recommendation follows ACR consensus guidelines: White Paper of the ACR Incidental Findings Committee II on Vascular Findings. [facility] Radiol 2013; 10:789-794   Electronically Signed   By: Monte Fantasia M.D.   On: 11/15/2014 00:32   US Abdomen Limited  11/15/2014   CLINICAL DATA:  Initial evaluation for acute right upper quadrant pain.  EXAM: US ABDOMEN LIMITED - RIGHT UPPER QUADRANT  COMPARISON:  Prior CT from earlier the same day.  FINDINGS: Gallbladder:  No gallstones or wall thickening visualized. No sonographic Murphy sign noted.  Common bile duct:  Diameter: 6.6 mm  Liver:  No focal lesion identified. Within normal limits in parenchymal echogenicity.  IMPRESSION: Normal right upper quadrant ultrasound with no sonographic evidence for cholelithiasis, acute cholecystitis, or biliary dilatation.   Electronically Signed   By: Jeannine Boga M.D.   On: 11/15/2014 00:46   Dg Ercp Biliary & Pancreatic Ducts  11/16/2014   CLINICAL DATA:  Cozaar pancreatitis and choledocholithiasis.  EXAM: ERCP  TECHNIQUE: Multiple  spot images obtained with the fluoroscopic device and submitted for interpretation post-procedure.  COMPARISON:  Ultrasound on 11/15/2014  FINDINGS: Imaging during ERCP shows cannulation of the common bile duct with balloon sweep maneuver performed. No filling defects identified upon completion.  IMPRESSION: Balloon sweep maneuver performed after common bile duct cannulation to remove calculi.  These images were submitted for radiologic interpretation only. Please see the procedural report for the amount of contrast and the fluoroscopy time utilized.   Electronically Signed   By: Aletta Edouard M.D.   On: 11/16/2014 15:18     Microbiology: No results found for this or any previous visit (from the past 240 hour(s)).   Labs: Basic Metabolic Panel:  Recent Labs Lab 11/15/14 0900 11/16/14 0549 11/17/14 0640 11/18/14 0248 11/19/14 0442  NA 137 136 132* 138 137  K 4.1 3.6 4.2 4.4 3.8  CL 103 104 102 105 101  CO2 26 23 21* 26 29  GLUCOSE 105* 97 90 108* 99  BUN 12 8 12 10 8   CREATININE 1.32* 1.01 1.01 1.03 1.00  CALCIUM 8.9 8.7* 8.5* 9.0 8.9  MG  --   --  1.8 1.8 1.6*   Liver Function Tests:  Recent Labs Lab 11/15/14 0900 11/16/14 0549 11/17/14 0640 11/18/14 0248 11/19/14 0442  AST 144* 88* 40 30 21  ALT 150* 144* 98* 78* 59  ALKPHOS 36* 40 43 46 51  BILITOT 1.8* 1.9* 1.2 0.9 0.7  PROT 5.8* 5.5* 5.5* 5.9* 6.1*  ALBUMIN 3.4* 3.1* 2.9* 2.9* 2.9*    Recent Labs Lab 11/14/14 0021 11/14/14 2054 11/15/14 0900 11/16/14 0549  LIPASE  --  >3000* 384* 152*  AMYLASE 1501*  --   --   --    No results for input(s): AMMONIA in the  last 168 hours. CBC:  Recent Labs Lab 11/14/14 2054 11/15/14 0900 11/18/14 0248  WBC 13.0* 9.6 7.0  NEUTROABS  --  7.9*  --   HGB 14.9 12.9* 12.1*  HCT 43.2 38.5* 36.2*  MCV 93.7 93.9 93.5  PLT 199 150 154   Cardiac Enzymes:  Recent Labs Lab 11/15/14 0300 11/15/14 0900 11/15/14 1450  TROPONINI <0.03 <0.03 <0.03   BNP: Invalid  input(s): POCBNP CBG:  Recent Labs Lab 11/15/14 0534 11/16/14 0031 11/16/14 0603 11/16/14 2332 11/17/14 0532  GLUCAP 111* 99 99 86 88    Time coordinating discharge:  Greater than 30 minutes  Signed:  Ersel Enslin, DO [facility] Pager: [phone] 11/19/2014, 1:52 PM

## 2014-11-19 NOTE — Care Management Important Message (Signed)
Important Message  Patient Details  Name: Steven Guzman MRN: [id number] Date of Birth: [birth year]   Medicare Important Message Given:  Yes-second notification given    Erenest Rasher, RN 11/19/2014, 1:56 PM

## 2014-11-19 NOTE — Progress Notes (Signed)
11/19/2014 4:00 PM  Discharge information reviewed with the patient.  Prescriptions, medications, discharge instructions and follow up appointment information was reviewed.  Pt and wife asked questions appropriately and verbalized understanding of information upon completion of session.  IVs removed, telemetry removed.  Pt dressed himself.  Patient and wife escorted downstairs.  Princella Pellegrini

## 2014-11-19 NOTE — Progress Notes (Signed)
Subjective: No complaints, SOB has improved.  Objective: Vital signs in last 24 hours: Temp:  [97.6 F (36.4 C)-98.5 F (36.9 C)] 98.5 F (36.9 C) (08/22 0806) Pulse Rate:  [57-121] 65 (08/22 0806) Resp:  [17-28] 17 (08/22 0806) BP: (102-135)/(61-89) 135/89 mmHg (08/22 0806) SpO2:  [91 %-98 %] 93 % (08/22 0812) Weight:  [212 lb 4.8 oz (96.299 kg)] 212 lb 4.8 oz (96.299 kg) (08/21 2327) Weight change: -10 lb 5.8 oz (-4.701 kg) Last BM Date: 11/17/14 Intake/Output from previous day: -3925 08/21 0701 - 08/22 0700 In: 850 [P.O.:850] Out: 4775 [Urine:4775] Intake/Output this shift: Total I/O In: 240 [P.O.:240] Out: 1200 [Urine:1200]  PE: General:Pleasant affect, NAD Skin:Warm and dry, brisk capillary refill HEENT:normocephalic, sclera clear, mucus membranes moist Heart:S1S2 RRR without murmur, gallup, rub or click Lungs:diminished without rales, rhonchi, or wheezes OZD:GUYQ, non tender, + BS, do not palpate liver spleen or masses Ext:no lower ext edema, 2+ pedal pulses, 2+ radial pulses Neuro:alert and oriented X 3, MAE, follows commands, + facial symmetry Tele:  SR some atrial pacing, no recurrent V tach   Lab Results:  Recent Labs  11/18/14 0248  WBC 7.0  HGB 12.1*  HCT 36.2*  PLT 154   BMET  Recent Labs  11/18/14 0248 11/19/14 0442  NA 138 137  K 4.4 3.8  CL 105 101  CO2 26 29  GLUCOSE 108* 99  BUN 10 8  CREATININE 1.03 1.00  CALCIUM 9.0 8.9   No results for input(s): TROPONINI in the last 72 hours.  Invalid input(s): CK, MB  Lab Results  Component Value Date   CHOL 150 11/25/2012   HDL 43 11/25/2012   LDLCALC 87 11/25/2012   TRIG 98 11/25/2012   CHOLHDL 3.5 11/25/2012   Lab Results  Component Value Date   HGBA1C 5.5 11/25/2012     Lab Results  Component Value Date   TSH 1.26 09/27/2014    Hepatic Function Panel  Recent Labs  11/17/14 0640  11/19/14 0442  PROT 5.5*  < > 6.1*  ALBUMIN 2.9*  < > 2.9*  AST 40  < > 21    ALT 98*  < > 59  ALKPHOS 43  < > 51  BILITOT 1.2  < > 0.7  BILIDIR 0.5  --   --   IBILI 0.7  --   --   < > = values in this interval not displayed. No results for input(s): CHOL in the last 72 hours. No results for input(s): PROTIME in the last 72 hours.     Studies/Results: No results found.  Medications: I have reviewed the patient's current medications. Scheduled Meds: . amiodarone  400 mg Oral BID  . aspirin EC  81 mg Oral Daily  . atorvastatin  80 mg Oral Daily  . benazepril  10 mg Oral Daily  . budesonide-formoterol  2 puff Inhalation BID  . busPIRone  7.5 mg Oral BID  . carvedilol  12.5 mg Oral BID WC  . fenofibrate  160 mg Oral Daily  . furosemide  40 mg Intravenous BID  . gabapentin  300 mg Oral BID  . ipratropium-albuterol  3 mL Inhalation QID  . magnesium sulfate 1 - 4 g bolus IVPB  2 g Intravenous Once  . pantoprazole  40 mg Oral Daily  . PARoxetine  40 mg Oral q morning - 10a  . potassium chloride  40 mEq Oral Once  . spironolactone  12.5 mg Oral  Daily  . tamsulosin  0.4 mg Oral QPC supper  . tiotropium  18 mcg Inhalation Daily   Continuous Infusions:  PRN Meds:.acetaminophen **OR** acetaminophen, morphine injection, nitroGLYCERIN, ondansetron **OR** ondansetron (ZOFRAN) IV Assessment/Plan: Active Problems:   CAD (coronary artery disease)   Essential hypertension   Chronic systolic heart failure   COPD GOLD GRADE C   Acute biliary pancreatitis   Choledocholithiasis   Cholelithiasis   Ventricular tachyarrhythmia   Acute pancreatitis   Acute on chronic systolic CHF (congestive heart failure)  1. Ventricular tachycardia The patient has had recurrent ventricular tachycardia in the setting of acute biliary pancreatitis requiring ICD shocks. No further episodes. Continue amiodarone 400mg  BID (LFT's elevated this admission due to biliary pancreatitis). With VT requiring ICD shocks, we will need to increase amiodarone for the short term (400 bid x 1 week  then 200 bid until followup with Dr Lovena Le) and follow LFT's clinically (coming down).  - Keep K>3.9, Mg >1.8  Today K+ 3.8 will give 40 meq Kdur and mag is 1.6 will give IV 2 Gms - No driving x6 months  2. CAD Last cath 2014 with 80-90% ostial PDA occlusion. No chest pain. We did not think that VT was due to ischemia. Continue ASA, statin.   3. Acute on chronic systolic CHF Ischemic cardiomyopathy. He is volume overloaded on exam yesterday and short of breath, likely due to fluid received in the setting of acute pancreatitis. with IV diuresis now improved.  - Continue Coreg, benazepril, spironolactone.  - Lasix 40 mg IV bid, follow K/Mg.  -3925 since yesterday.  Since admit negative 4,207 weight s are up and down. ? Change to po in AM was on 20 mg po lasix prior to admit.  4. Acute biliary pancreatitis Pain improving. Had ERCP with sphincterotomy and stone removal. Given improvement in symptoms, would avoid CCY at this time given high cardiac risk. Cholecystostomy tube would be option with recurrent symptoms this admission.   Follow-up with Dr Lovena Le EP NP in the office in 2-3 weeks.  Has appt already scheduled.   LOS: 4 days   Time spent with pt. :15 minutes. [facility]  Nurse Practitioner Certified Pager [phone] or after 5pm and on weekends call [phone] 11/19/2014, 10:32 AM   Attending Note:   The patient was seen and examined.  Agree with assessment and plan as noted above.  Changes made to the above note as needed.  Pt is stable . No further episodes of VT Continue reload with amiodarone -   (400 bid x 1 week then 200 bid until followup with Dr Lovena Le) He is stable from out standpoint and may be discharged  Will sign off. Call for questions   Ramond Dial., MD, [facility] 11/19/2014, 1:16 PM [address]  [place] Pager [phone]

## 2014-11-20 ENCOUNTER — Telehealth: Payer: Self-pay | Admitting: *Deleted

## 2014-11-20 NOTE — Telephone Encounter (Signed)
Transition Care Management Follow-up Telephone Call   Date discharged? 11/19/14   How have you been since you were released from the hospital? Pt states he is feeling all right a little weak but that's expected   Do you understand why you were in the hospital? YES   Do you understand the discharge instructions? YES   Where were you discharged to? Home   Items Reviewed:  Medications reviewed: YES  Allergies reviewed: YES  Dietary changes reviewed: NO  Referrals reviewed: No referral needed   Functional Questionnaire:   Activities of Daily Living (ADLs):   He states he are independent in the following: ambulation, bathing and hygiene, feeding, continence, grooming, toileting and dressing States he doesn't require assistance    Any transportation issues/concerns?: NO   Any patient concerns? NO   Confirmed importance and date/time of follow-up visits scheduled YES, appt made 12/06/14  Provider Appointment booked with Dr. Doug Sou  Confirmed with patient if condition begins to worsen call PCP or go to the ER.  Patient was given the office number and encouraged to call back with question or concerns.  : YES

## 2014-11-27 ENCOUNTER — Encounter: Payer: Medicare Other | Admitting: Internal Medicine

## 2014-12-05 ENCOUNTER — Other Ambulatory Visit: Payer: Self-pay

## 2014-12-05 ENCOUNTER — Telehealth: Payer: Self-pay | Admitting: Internal Medicine

## 2014-12-05 MED ORDER — CHOLINE FENOFIBRATE 135 MG PO CPDR: 135.0000 mg | DELAYED_RELEASE_CAPSULE | Freq: Every day | ORAL | Status: DC

## 2014-12-05 NOTE — Telephone Encounter (Signed)
New message     Pt c/o medication issue:  1. Name of Medication:  amiodarone 2. How are you currently taking this medication (dosage and times per day)? 400mg  daily  3. Are you having a reaction (difficulty breathing--STAT)? no  4. What is your medication issue? rx is causing hand and legs trembles, lightheadedness, numbness in big toe

## 2014-12-05 NOTE — Telephone Encounter (Signed)
Returned pt call. Pt Amiodarone was increased at d/c form [facility] To 400mg  bid for 5 dats then 200mg  bid. Pt sts that he has been having tremors in his legs and hands, light headiness, some numbness in his toes. Pt denies palpitations, he did not ck his bp. Adv pt I will discuss his symptoms with Dr.Taylor and call back with his recommendation. Pt agreeable and verbalized understanding.  Pt aware of Dr.Taylor recommendation. Pt should continue Amio 200mg  bid, at least until his o/v on 9/14. Dr.Taylor will discuss symptoms and possible med adjust at appt. Pt agreeable with plan and verbalized understanding.

## 2014-12-06 ENCOUNTER — Other Ambulatory Visit (INDEPENDENT_AMBULATORY_CARE_PROVIDER_SITE_OTHER): Payer: Medicare Other

## 2014-12-06 ENCOUNTER — Encounter: Payer: Self-pay | Admitting: Internal Medicine

## 2014-12-06 ENCOUNTER — Ambulatory Visit (INDEPENDENT_AMBULATORY_CARE_PROVIDER_SITE_OTHER): Payer: Medicare Other | Admitting: Internal Medicine

## 2014-12-06 VITALS — BP 102/70 | HR 59 | Temp 98.2°F | Resp 14 | Ht 71.0 in | Wt 218.8 lb

## 2014-12-06 DIAGNOSIS — I255 Ischemic cardiomyopathy: Secondary | ICD-10-CM | POA: Diagnosis not present

## 2014-12-06 DIAGNOSIS — I5022 Chronic systolic (congestive) heart failure: Secondary | ICD-10-CM | POA: Diagnosis not present

## 2014-12-06 DIAGNOSIS — K8050 Calculus of bile duct without cholangitis or cholecystitis without obstruction: Secondary | ICD-10-CM

## 2014-12-06 DIAGNOSIS — I4729 Other ventricular tachycardia: Secondary | ICD-10-CM

## 2014-12-06 DIAGNOSIS — Z95810 Presence of automatic (implantable) cardiac defibrillator: Secondary | ICD-10-CM

## 2014-12-06 DIAGNOSIS — K851 Biliary acute pancreatitis without necrosis or infection: Secondary | ICD-10-CM

## 2014-12-06 DIAGNOSIS — I472 Ventricular tachycardia: Secondary | ICD-10-CM | POA: Diagnosis not present

## 2014-12-06 LAB — COMPREHENSIVE METABOLIC PANEL
ALT: 34 U/L (ref 0–53)
AST: 23 U/L (ref 0–37)
Albumin: 4.1 g/dL (ref 3.5–5.2)
Alkaline Phosphatase: 40 U/L (ref 39–117)
BUN: 11 mg/dL (ref 6–23)
CHLORIDE: 102 meq/L (ref 96–112)
CO2: 28 meq/L (ref 19–32)
CREATININE: 1.09 mg/dL (ref 0.40–1.50)
Calcium: 9.3 mg/dL (ref 8.4–10.5)
GFR: 69.9 mL/min (ref >60.00)
GLUCOSE: 99 mg/dL (ref 70–99)
Potassium: 4.2 mEq/L (ref 3.5–5.1)
SODIUM: 137 meq/L (ref 135–145)
Total Bilirubin: 0.6 mg/dL (ref 0.2–1.2)
Total Protein: 6.7 g/dL (ref 6.0–8.3)

## 2014-12-06 LAB — LIPASE: Lipase: 55 U/L (ref 11.0–59.0)

## 2014-12-06 LAB — MAGNESIUM: Magnesium: 1.8 mg/dL (ref 1.5–2.5)

## 2014-12-06 NOTE — Progress Notes (Signed)
Pre visit review using our clinic review tool, if applicable. No additional management support is needed unless otherwise documented below in the visit note. 

## 2014-12-06 NOTE — Patient Instructions (Signed)
We are checking the labs today and will call you with the results as well as forward them to the cardiologist.   Keep working on not smoking as it is very good for your health to stop.  Smoking Cessation Quitting smoking is important to your health and has many advantages. However, it is not always easy to quit since nicotine is a very addictive drug. Oftentimes, people try 3 times or more before being able to quit. This document explains the best ways for you to prepare to quit smoking. Quitting takes hard work and a lot of effort, but you can do it. ADVANTAGES OF QUITTING SMOKING  You will live longer, feel better, and live better.  Your body will feel the impact of quitting smoking almost immediately.  Within 20 minutes, blood pressure decreases. Your pulse returns to its normal level.  After 8 hours, carbon monoxide levels in the blood return to normal. Your oxygen level increases.  After 24 hours, the chance of having a heart attack starts to decrease. Your breath, hair, and body stop smelling like smoke.  After 48 hours, damaged nerve endings begin to recover. Your sense of taste and smell improve.  After 72 hours, the body is virtually free of nicotine. Your bronchial tubes relax and breathing becomes easier.  After 2 to 12 weeks, lungs can hold more air. Exercise becomes easier and circulation improves.  The risk of having a heart attack, stroke, cancer, or lung disease is greatly reduced.  After 1 year, the risk of coronary heart disease is cut in half.  After 5 years, the risk of stroke falls to the same as a nonsmoker.  After 10 years, the risk of lung cancer is cut in half and the risk of other cancers decreases significantly.  After 15 years, the risk of coronary heart disease drops, usually to the level of a nonsmoker.  If you are pregnant, quitting smoking will improve your chances of having a healthy baby.  The people you live with, especially any children, will be  healthier.  You will have extra money to spend on things other than cigarettes. QUESTIONS TO THINK ABOUT BEFORE ATTEMPTING TO QUIT You may want to talk about your answers with your health care provider.  Why do you want to quit?  If you tried to quit in the past, what helped and what did not?  What will be the most difficult situations for you after you quit? How will you plan to handle them?  Who can help you through the tough times? Your family? Friends? A health care provider?  What pleasures do you get from smoking? What ways can you still get pleasure if you quit? Here are some questions to ask your health care provider:  How can you help me to be successful at quitting?  What medicine do you think would be best for me and how should I take it?  What should I do if I need more help?  What is smoking withdrawal like? How can I get information on withdrawal? GET READY  Set a quit date.  Change your environment by getting rid of all cigarettes, ashtrays, matches, and lighters in your home, car, or work. Do not let people smoke in your home.  Review your past attempts to quit. Think about what worked and what did not. GET SUPPORT AND ENCOURAGEMENT You have a better chance of being successful if you have help. You can get support in many ways.  Tell your family,  friends, and coworkers that you are going to quit and need their support. Ask them not to smoke around you.  Get individual, group, or telephone counseling and support. Programs are available at [employer] and health centers. Call your local health department for information about programs in your area.  Spiritual beliefs and practices may help some smokers quit.  Download a "quit meter" on your computer to keep track of quit statistics, such as how long you have gone without smoking, cigarettes not smoked, and money saved.  Get a self-help book about quitting smoking and staying off tobacco. Custer yourself from urges to smoke. Talk to someone, go for a walk, or occupy your time with a task.  Change your normal routine. Take a different route to work. Drink tea instead of coffee. Eat breakfast in a different place.  Reduce your stress. Take a hot bath, exercise, or read a book.  Plan something enjoyable to do every day. Reward yourself for not smoking.  Explore interactive web-based programs that specialize in helping you quit. GET MEDICINE AND USE IT CORRECTLY Medicines can help you stop smoking and decrease the urge to smoke. Combining medicine with the above behavioral methods and support can greatly increase your chances of successfully quitting smoking.  Nicotine replacement therapy helps deliver nicotine to your body without the negative effects and risks of smoking. Nicotine replacement therapy includes nicotine gum, lozenges, inhalers, nasal sprays, and skin patches. Some may be available over-the-counter and others require a prescription.  Antidepressant medicine helps people abstain from smoking, but how this works is unknown. This medicine is available by prescription.  Nicotinic receptor partial agonist medicine simulates the effect of nicotine in your brain. This medicine is available by prescription. Ask your health care provider for advice about which medicines to use and how to use them based on your health history. Your health care provider will tell you what side effects to look out for if you choose to be on a medicine or therapy. Carefully read the information on the package. Do not use any other product containing nicotine while using a nicotine replacement product.  RELAPSE OR DIFFICULT SITUATIONS Most relapses occur within the first 3 months after quitting. Do not be discouraged if you start smoking again. Remember, most people try several times before finally quitting. You may have symptoms of withdrawal because your body is used to nicotine.  You may crave cigarettes, be irritable, feel very hungry, cough often, get headaches, or have difficulty concentrating. The withdrawal symptoms are only temporary. They are strongest when you first quit, but they will go away within 10-14 days. To reduce the chances of relapse, try to:  Avoid drinking alcohol. Drinking lowers your chances of successfully quitting.  Reduce the amount of caffeine you consume. Once you quit smoking, the amount of caffeine in your body increases and can give you symptoms, such as a rapid heartbeat, sweating, and anxiety.  Avoid smokers because they can make you want to smoke.  Do not let weight gain distract you. Many smokers will gain weight when they quit, usually less than 10 pounds. Eat a healthy diet and stay active. You can always lose the weight gained after you quit.  Find ways to improve your mood other than smoking. FOR MORE INFORMATION  www.smokefree.gov  Document Released: 03/10/2001 Document Revised: 07/31/2013 Document Reviewed: 06/25/2011 [facility] Patient Information 2015 [place], [place]. This information is not intended to replace advice given to you by  your health care provider. Make sure you discuss any questions you have with your health care provider.  

## 2014-12-07 ENCOUNTER — Telehealth: Payer: Self-pay | Admitting: Pulmonary Disease

## 2014-12-07 MED ORDER — TIOTROPIUM BROMIDE MONOHYDRATE 18 MCG IN CAPS: 18.0000 ug | ORAL_CAPSULE | Freq: Every day | RESPIRATORY_TRACT | Status: DC

## 2014-12-07 NOTE — Telephone Encounter (Signed)
Called pt. Needs refill on spiriva. RX sent in. Nothing further needed

## 2014-12-08 ENCOUNTER — Encounter: Payer: Self-pay | Admitting: Internal Medicine

## 2014-12-08 NOTE — Assessment & Plan Note (Signed)
Does not appear to be volume overloaded. Checking CMP and magnesium levels today.

## 2014-12-08 NOTE — Assessment & Plan Note (Signed)
Has not fired since leaving the hospital.

## 2014-12-08 NOTE — Progress Notes (Signed)
   Subjective:    Patient ID: Steven Guzman, male    DOB: October 30, 1940, 74 y.o.   MRN: [id number]  HPI The patient is a 74 YO man coming in for hospital follow up (in with biliary pancreatitis, had ERCP to remove stone and had many episodes of V tach with that in which his ICD fired, medications adjusted for his heart during the hospital and no more episodes). He has adjusted well to being back at home. No falls and getting around okay. Still tired. Is thinking that the amiodarone is giving him some shaking and some balance problems. He has gone down to 200 mg BID per cardiology instructions and he still thinks it is too much. Feels stable on his feet but not great. Not working with PT at home (not needed). No other medication concerns. ICD has not fired since leaving the hospital. His stomach is pain free and moving his bowels normally. Back to his normal diet.   Review of Systems  Constitutional: Positive for activity change and fatigue. Negative for fever, appetite change and unexpected weight change.  HENT: Negative.   Eyes: Negative.   Respiratory: Positive for shortness of breath. Negative for cough, chest tightness and wheezing.   Cardiovascular: Negative for chest pain, palpitations and leg swelling.  Gastrointestinal: Negative for abdominal pain, diarrhea, constipation and abdominal distention.  Musculoskeletal: Positive for arthralgias.  Skin: Negative.   Neurological: Positive for weakness. Negative for dizziness, tremors, light-headedness, numbness and headaches.       Balance instability  Psychiatric/Behavioral: Negative.       Objective:   Physical Exam  Constitutional: He is oriented to person, place, and time. He appears well-developed and well-nourished.  HENT:  Head: Normocephalic and atraumatic.  Eyes: EOM are normal.  Neck: Normal range of motion.  Cardiovascular: Normal rate.   Pulmonary/Chest: Effort normal and breath sounds normal.  Abdominal: Soft. Bowel sounds  are normal. He exhibits no distension. There is no tenderness. There is no rebound.  Musculoskeletal: He exhibits no edema.  Neurological: He is alert and oriented to person, place, and time. Coordination normal.  Skin: Skin is warm and dry.  Psychiatric: He has a normal mood and affect. His behavior is normal.   Filed Vitals:   12/06/14 1335  BP: 102/70  Pulse: 59  Temp: 98.2 F (36.8 C)  TempSrc: Oral  Resp: 14  Height: 5\' 11"  (1.803 m)  Weight: 218 lb 12.8 oz (99.247 kg)  SpO2: 91%      Assessment & Plan:

## 2014-12-08 NOTE — Assessment & Plan Note (Signed)
At this time appears to be resolved with no pain and diet normal. Bowels moving normally. Checking lipase for further trending down (was initially elevated to >3000).

## 2014-12-08 NOTE — Assessment & Plan Note (Signed)
No recurrent symptoms. He has not been advised to get his gallbladder out due to the risk of v tach during surgery (with the ERCP he had cardiac problems). Will check lipase today and monitor for recurrence. He knows the symptoms. Diet is back to normal and bowels moving normally.

## 2014-12-11 ENCOUNTER — Telehealth: Payer: Self-pay | Admitting: Gastroenterology

## 2014-12-11 NOTE — Telephone Encounter (Signed)
Pt states that he had an ERCP done by Dr. Deatra Ina 11/16/14. States he was home about a week and he started having what he describes as an Surveyor, mining shock without pain in his stomach." States the sensation starts on the right side and it makes him jump when it happens. Pt states last night he experienced this feeling and it went into his chest and throat also. Pt is not sure what he needs to do. Please advise.

## 2014-12-11 NOTE — Telephone Encounter (Signed)
Not at all clear to me what is causing his symptoms. He can see a GI extender or his PCP for further evaluation. Obviously, if severe, he can be evaluated at the emergency room

## 2014-12-11 NOTE — Telephone Encounter (Signed)
Pt scheduled to see Alonza Bogus PA 12/13/14@2 :30pm. Pt aware of appt.

## 2014-12-12 ENCOUNTER — Ambulatory Visit (INDEPENDENT_AMBULATORY_CARE_PROVIDER_SITE_OTHER): Payer: Medicare Other | Admitting: Internal Medicine

## 2014-12-12 ENCOUNTER — Encounter: Payer: Self-pay | Admitting: Internal Medicine

## 2014-12-12 ENCOUNTER — Encounter: Payer: Self-pay | Admitting: *Deleted

## 2014-12-12 VITALS — BP 92/66 | HR 60 | Ht 71.0 in | Wt 216.0 lb

## 2014-12-12 DIAGNOSIS — Z01812 Encounter for preprocedural laboratory examination: Secondary | ICD-10-CM | POA: Diagnosis not present

## 2014-12-12 DIAGNOSIS — I5022 Chronic systolic (congestive) heart failure: Secondary | ICD-10-CM

## 2014-12-12 DIAGNOSIS — I255 Ischemic cardiomyopathy: Secondary | ICD-10-CM | POA: Diagnosis not present

## 2014-12-12 DIAGNOSIS — I472 Ventricular tachycardia, unspecified: Secondary | ICD-10-CM

## 2014-12-12 DIAGNOSIS — Z95810 Presence of automatic (implantable) cardiac defibrillator: Secondary | ICD-10-CM | POA: Diagnosis not present

## 2014-12-12 LAB — CUP PACEART INCLINIC DEVICE CHECK
HIGH POWER IMPEDANCE MEASURED VALUE: 43 Ohm
HIGH POWER IMPEDANCE MEASURED VALUE: 57 Ohm
Lead Channel Impedance Value: 501 Ohm
Lead Channel Impedance Value: 634 Ohm
Lead Channel Pacing Threshold Amplitude: 1.1 V
Lead Channel Pacing Threshold Pulse Width: 0.4 ms
Lead Channel Pacing Threshold Pulse Width: 0.4 ms
Lead Channel Sensing Intrinsic Amplitude: 11.3 mV
Lead Channel Setting Pacing Amplitude: 2 V
Lead Channel Setting Pacing Pulse Width: 0.4 ms
Lead Channel Setting Sensing Sensitivity: 0.6 mV
MDC IDC MSMT LEADCHNL RA PACING THRESHOLD AMPLITUDE: 0.8 V
MDC IDC MSMT LEADCHNL RA SENSING INTR AMPL: 3.4 mV
MDC IDC SESS DTM: 20160914040000
MDC IDC SET LEADCHNL RA PACING AMPLITUDE: 2 V
MDC IDC SET ZONE DETECTION INTERVAL: 400 ms
MDC IDC STAT BRADY RA PERCENT PACED: 85 %
MDC IDC STAT BRADY RV PERCENT PACED: 1 % — AB
Pulse Gen Serial Number: 164892
Zone Setting Detection Interval: 286 ms

## 2014-12-12 LAB — CBC WITH DIFFERENTIAL/PLATELET
BASOS PCT: 0.3 % (ref 0.0–3.0)
Basophils Absolute: 0 10*3/uL (ref 0.0–0.1)
EOS ABS: 0.2 10*3/uL (ref 0.0–0.7)
EOS PCT: 2 % (ref 0.0–5.0)
HCT: 43.4 % (ref 39.0–52.0)
HEMOGLOBIN: 14.4 g/dL (ref 13.0–17.0)
Lymphocytes Relative: 19.2 % (ref 12.0–46.0)
Lymphs Abs: 1.8 10*3/uL (ref 0.7–4.0)
MCHC: 33.3 g/dL (ref 30.0–36.0)
MCV: 94.6 fl (ref 78.0–100.0)
MONO ABS: 0.8 10*3/uL (ref 0.1–1.0)
Monocytes Relative: 8.7 % (ref 3.0–12.0)
NEUTROS ABS: 6.5 10*3/uL (ref 1.4–7.7)
Neutrophils Relative %: 69.8 % (ref 43.0–77.0)
PLATELETS: 203 10*3/uL (ref 150.0–400.0)
RBC: 4.59 Mil/uL (ref 4.22–5.81)
RDW: 14.7 % (ref 11.5–15.5)
WBC: 9.3 10*3/uL (ref 4.0–10.5)

## 2014-12-12 LAB — BASIC METABOLIC PANEL
BUN: 14 mg/dL (ref 6–23)
CALCIUM: 9.9 mg/dL (ref 8.4–10.5)
CO2: 29 mEq/L (ref 19–32)
Chloride: 100 mEq/L (ref 96–112)
Creatinine, Ser: 1.26 mg/dL (ref 0.40–1.50)
GFR: 59.13 mL/min — AB (ref >60.00)
GLUCOSE: 100 mg/dL — AB (ref 70–99)
Potassium: 4.4 mEq/L (ref 3.5–5.1)
SODIUM: 136 meq/L (ref 135–145)

## 2014-12-12 NOTE — Assessment & Plan Note (Signed)
He has had recurrent VT despite amiodarone, refractory to ATP but treated with successful ICD shock. I have discussed the treatment options with the patient. He wishes to proceed with EP study and catheter ablation. Will use CARTO.

## 2014-12-12 NOTE — Assessment & Plan Note (Signed)
He has been on high dose of statin therapy. Will plan to follow carefully. I will defer decision about reducing does of statin to Dr. Tamala Julian.

## 2014-12-12 NOTE — Assessment & Plan Note (Signed)
His symptoms are class 2. He will continue his current meds and maintain a low sodium diet. 

## 2014-12-12 NOTE — Patient Instructions (Signed)
Medication Instructions:  Your physician recommends that you continue on your current medications as directed. Please refer to the Current Medication list given to you today.  Labwork: Pre procedure labs today  Testing/Procedures: Your physician has recommended that you have a VT ablation. Catheter ablation is a medical procedure used to treat some cardiac arrhythmias (irregular heartbeats). During catheter ablation, a long, thin, flexible tube is put into a blood vessel in your groin (upper thigh), or neck. This tube is called an ablation catheter. It is then guided to your heart through the blood vessel. Radio frequency waves destroy small areas of heart tissue where abnormal heartbeats may cause an arrhythmia to start. Please see the instruction sheet given to you today.  Follow-Up: Your physician recommends that you schedule a follow-up appointment in: 4 weeks, from 9/23, with Chanetta Marshall, NP.  Your physician recommends that you schedule a follow-up appointment in: 3 months with Dr. Lovena Le.  Any Other Special Instructions Will Be Listed Below (If Applicable). Thank you for choosing [facility]!!

## 2014-12-12 NOTE — Progress Notes (Signed)
HPI Steven Guzman returns today for ongoing evaluation and management of his ICD and ventricular tachycardia. He is a very pleasant 74 year old man with known coronary disease, status post MI, who developed ventricular tachycardia and underwent ICD implantation several years ago. Because of recurrent ventricular tachycardia, he was placed on amiodarone therapy. He has had recurrent VT despite fairly high dose amiodarone.  He has been hospitalized with recurrent VT. He has also had VT after ERCP.  Allergies  Allergen Reactions  . Sulfa Antibiotics Hives     Current Outpatient Prescriptions  Medication Sig Dispense Refill  . amiodarone (PACERONE) 200 MG tablet Take 200 mg by mouth 2 (two) times daily. Take one tablet by mouth in the morning and one by mouth in the evening    . aspirin 81 MG tablet Take 81 mg by mouth daily.    [name] atorvastatin (LIPITOR) 80 MG tablet Take 1 tablet (80 mg total) by mouth daily. 30 tablet 6  . benazepril (LOTENSIN) 10 MG tablet Take 1 tablet (10 mg total) by mouth daily. 90 tablet 3  . budesonide-formoterol (SYMBICORT) 160-4.5 MCG/ACT inhaler Inhale 2 puffs into the lungs first thing in the am and then inhale another 2 puffs into the lungs about 12 hours later.    . busPIRone (BUSPAR) 15 MG tablet Take 15 mg by mouth 3 (three) times daily. Take one half tablet by mouth in the morning and one half tablet in the evening.    . carvedilol (COREG) 12.5 MG tablet Take 1 tablet (12.5 mg total) by mouth 2 (two) times daily with a meal. 180 tablet 3  . cetirizine (ZYRTEC) 10 MG tablet Take 10 mg by mouth daily as needed for allergies.    . Choline Fenofibrate (TRILIPIX) 135 MG capsule Take 1 capsule (135 mg total) by mouth daily. 30 capsule 1  . furosemide (LASIX) 40 MG tablet Take 20 mg by mouth daily.     [name] gabapentin (NEURONTIN) 100 MG capsule Take 300 mg by mouth 2 (two) times daily.   4  . hyoscyamine (LEVSIN SL) 0.125 MG SL tablet Dissolve 1 tablet under the tongue  every 4 hours as needed for gallbladder pain  0  . Ipratropium-Albuterol (COMBIVENT RESPIMAT) 20-100 MCG/ACT AERS respimat Inhale 1 puff into the lungs 4 (four) times daily. Shortness of breath or wheezing (Patient taking differently: Inhale 1 puff into the lungs every 6 (six) hours as needed for wheezing. Shortness of breath or wheezing) 1 Inhaler 5  . nitroGLYCERIN (NITROSTAT) 0.4 MG SL tablet Place 1 tablet (0.4 mg total) under the tongue every 5 (five) minutes as needed for chest pain. 25 tablet 12  . omeprazole (PRILOSEC) 20 MG capsule Take 20 mg by mouth 2 (two) times daily.    [name] PARoxetine (PAXIL) 40 MG tablet take 1 tablet by mouth every morning 30 tablet 11  . polyethylene glycol (MIRALAX / GLYCOLAX) packet Take 17 g by mouth daily as needed. (CONSTIPATION)  0  . spironolactone (ALDACTONE) 25 MG tablet Take 0.5 tablets (12.5 mg total) by mouth daily. 45 tablet 2  . tamsulosin (FLOMAX) 0.4 MG CAPS capsule take 1 capsule by mouth daily AFTER SUPPER 30 capsule 3  . tiotropium (SPIRIVA) 18 MCG inhalation capsule Place 1 capsule (18 mcg total) into inhaler and inhale daily. 30 capsule 3  . varenicline (CHANTIX PAK) 0.5 MG X 11 & 1 MG X 42 tablet Take one 0.5 mg tablet by mouth once daily for 3 days, then  increase to one 0.5 mg tablet twice daily for 4 days, then increase to one 1 mg tablet twice daily. 53 tablet 3   No current facility-administered medications for this visit.     Past Medical History  Diagnosis Date  . Hypertension   . Hyperlipidemia   . COPD (chronic obstructive pulmonary disease)     a. followed by pulmonary, COPD GOLD stage II  . GERD (gastroesophageal reflux disease)   . Diverticulosis of colon 07/2014    noted on CT  . CAD (coronary artery disease)     a. s/p MI in 1994 with PCI to LAD at that time b. cath 10/2012 demonstrated EF 30%, inferior akinesis with mild hypokinesis of all walls, patent LAD and RCA stents; ostial PDA with 80-90% obstruction with medical  therapy recommended   . Depression   . Aneurysm     abdominal <4. 3.6 cm on ultrasound 09/2014.   . Tobacco abuse   . Ventricular tachycardia     a. 08/2009 s/p BSX E110 Teligen 100 AICD, ser#: [id number];  b. 08/2008 VT req ATP - detection reprogrammed from 160 to 150.  [name] Chronic systolic CHF (congestive heart failure)     EF 30 to 35 % as of 09/2014.   [name] Hiatal hernia   . Prostate enlargement 07/2014    observed on CT  . Hyperglycemia 10/2012.    ROS:   All systems reviewed and negative except as noted in the HPI.   Past Surgical History  Procedure Laterality Date  . Hernia repair    . Foot surgery      lt  . Hemorrhoid banding    . Retinal detachment surgery      right  . Nose surgery      skin cancer-mohs-skin graft  . Colonoscopy    . Tenolysis Right 12/21/2013    Procedure: TENOLYSIS FLEXOR CARPI RADIALIS ,DEBRIDEMENT RIGHT JOINT WRIST,DEBRIDEMENT SCAPHOTRAPEZIAL TRAPEZOID, REPAIR OF EXTENSOR HOOD;  Surgeon: Daryll Brod, MD;  Location: [place];  Service: Orthopedics;  Laterality: Right;  . Left heart catheterization with coronary angiogram N/A 11/25/2012    demonstrated EF 30%, inferior akinesis with mild hypokinesis of all walls, patent LAD and RCA stents; ostial PDA with 80-90% obstruction with medical therapy recommended  . Implantable cardioverter defibrillator implant  09/06/09    BSX dual chamber ICD implanted in [place] for cardiac arrest 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  . Ercp N/A 11/16/2014    Procedure: ENDOSCOPIC RETROGRADE CHOLANGIOPANCREATOGRAPHY (ERCP);  Surgeon: [name], MD;  Location: [place];  Service: Endoscopy;  Laterality: N/A;     Family History  Problem Relation Age of Onset  . Heart attack Brother   . CAD Father   . CAD Mother   . Hypertension Mother   . Hypertension Father   . Hypertension Brother   . Stroke Neg Hx      Social History   Social History  . Marital Status: Married    Spouse Name: N/A  . Number of Children:  N/A  . Years of Education: N/A   Occupational History  . Retired    Social History Main Topics  . Smoking status: Current Every Day Smoker -- 1.00 packs/day for 55 years    Types: Cigarettes  . Smokeless tobacco: Never Used     Comment: attending the "stop smoking" classes through [place]  . Alcohol Use: 0.0 oz/week    0 Standard drinks or equivalent per week     Comment:  occasionally   . Drug Use: No  . Sexual Activity: Not Currently   Other Topics Concern  . Not on file   Social History Narrative     BP 92/66 mmHg  Pulse 60  Ht 5\' 11"  (1.803 m)  Wt 216 lb (97.977 kg)  BMI 30.14 kg/m2  Physical Exam:  Well appearing 74 year old man, NAD HEENT: Unremarkable Neck:  7 cm JVD, no thyromegally Back:  No CVA tenderness Lungs:  Clear with no wheezes, rales, or rhonchi. HEART:  Regular rate rhythm, no murmurs, no rubs, no clicks Abd:  soft, positive bowel sounds, no organomegally, no rebound, no guarding Ext:  2 plus pulses, no edema, no cyanosis, no clubbing Skin:  No rashes no nodules Neuro:  CN II through XII intact, motor grossly intact  DEVICE  Normal device function.  See PaceArt for details.   Assess/Plan:

## 2014-12-12 NOTE — Assessment & Plan Note (Signed)
His device is working normally. Will recheck in several months. 

## 2014-12-13 ENCOUNTER — Ambulatory Visit (INDEPENDENT_AMBULATORY_CARE_PROVIDER_SITE_OTHER): Payer: Medicare Other | Admitting: Gastroenterology

## 2014-12-13 ENCOUNTER — Encounter: Payer: Self-pay | Admitting: Gastroenterology

## 2014-12-13 VITALS — BP 90/56 | HR 72 | Ht 71.0 in | Wt 217.4 lb

## 2014-12-13 DIAGNOSIS — I255 Ischemic cardiomyopathy: Secondary | ICD-10-CM

## 2014-12-13 DIAGNOSIS — R1011 Right upper quadrant pain: Secondary | ICD-10-CM | POA: Insufficient documentation

## 2014-12-13 NOTE — Progress Notes (Signed)
     12/13/2014 Steven Guzman [id number] 09-29-1940   History of Present Illness:  This is a 74 year old male with MULTIPLE SIGNIFICANT medical problems as listed below. These include coronary artery disease with ischemic cardiomyopathy (ejection fraction 30-35%), recurrent ventricular tachycardia with ICD in place, chronic tobacco abuse with resultant COPD, hypertension, hyperlipidemia, GERD.  He was recently admitted to the hospital in August with acute biliary pancreatitis. He underwent ERCP with Dr. Deatra Ina on August 19 with sphincterotomy and removal of stones. Patient was to have cholecystectomy, however, developed V. tach after his ERCP and his ICD fired. Surgery elected to the defer cholecystectomy for now. The patient reports that he was just seen by cardiology yesterday. He is going to undergo a cardiac ablation next Friday. Anticipating that the ablation works then he may be able to be cleared by cardiology for cholecystectomy after about 5 weeks from then.  Anyway, the patient presents to our office today with complaints of intermittent odd sensations in his right upper quadrant. He says that they are not really a pain but describes it as a spasm of some sort. Also says that on a couple of occasions it felt like an electrical sensation that causes him to jump. It only lasts about 1-2 seconds before resolving. It was only occurring every couple of days, but now is happening 2 or 3 times daily. He has no other associated symptoms. It occurred while he is getting his ICD interrogated yesterday but did not affect the ICD, etc.   Current Medications, Allergies, Past Medical History, Past Surgical History, Family History and Social History were reviewed in [employer] record.   Physical Exam: BP 90/56 mmHg  Pulse 72  Ht 5\' 11"  (1.803 m)  Wt 217 lb 6.4 oz (98.612 kg)  BMI 30.33 kg/m2 General: Well developed male in no acute distress Head: Normocephalic and  atraumatic Eyes:  Sclerae anicteric, conjunctiva pink  Ears: Normal auditory acuity Lungs: Clear throughout to auscultation Heart: Regular rate and rhythm Abdomen: Soft, non-distended.  Normal bowel sounds.  Non-tender.  Umbilical hernia noted. Musculoskeletal: Symmetrical with no gross deformities  Extremities: No edema  Neurological: Alert oriented x 4, grossly non-focal Psychological:  Alert and cooperative. Normal mood and affect  Assessment and Recommendations: -RUQ abdominal pain/odd sensation in a patient with recent CBD stones requiring ERCP.  Not able to have cholecystectomy right now due to high risk cardiac issues.  Lipase and LFT's normal.  Discussed with Dr. Deatra Ina.  Doubt that this is biliary/GI pain.  Patient is actually blaming the amiodorone since dose was increased significantly recently.  Patient for cardiac ablation next week and hopefully elective cholecystectomy down the road if cleared by cardiology.  Patient to return to the ER if develops severe pain that is persistent, with nausea and vomiting, fevers, jaundice, etc.

## 2014-12-14 ENCOUNTER — Encounter: Payer: Self-pay | Admitting: Internal Medicine

## 2014-12-14 ENCOUNTER — Other Ambulatory Visit: Payer: Self-pay | Admitting: *Deleted

## 2014-12-14 ENCOUNTER — Ambulatory Visit: Payer: Medicare Other | Admitting: Interventional Cardiology

## 2014-12-18 NOTE — Progress Notes (Signed)
Office encounter noted. Agree with plans as initially outlined

## 2014-12-21 ENCOUNTER — Encounter (HOSPITAL_COMMUNITY): Payer: Self-pay | Admitting: General Practice

## 2014-12-21 ENCOUNTER — Ambulatory Visit (HOSPITAL_COMMUNITY)
Admission: RE | Admit: 2014-12-21 | Discharge: 2014-12-22 | Disposition: A | Discharge status: Home / Self Care | Payer: Medicare Other | Source: Ambulatory Visit | Attending: Internal Medicine | Admitting: Internal Medicine

## 2014-12-21 ENCOUNTER — Encounter (HOSPITAL_COMMUNITY)
Admission: RE | Disposition: A | Discharge status: Home / Self Care | Payer: Self-pay | Source: Ambulatory Visit | Attending: Internal Medicine

## 2014-12-21 DIAGNOSIS — I2510 Atherosclerotic heart disease of native coronary artery without angina pectoris: Secondary | ICD-10-CM | POA: Insufficient documentation

## 2014-12-21 DIAGNOSIS — K219 Gastro-esophageal reflux disease without esophagitis: Secondary | ICD-10-CM | POA: Insufficient documentation

## 2014-12-21 DIAGNOSIS — E785 Hyperlipidemia, unspecified: Secondary | ICD-10-CM | POA: Diagnosis not present

## 2014-12-21 DIAGNOSIS — J449 Chronic obstructive pulmonary disease, unspecified: Secondary | ICD-10-CM | POA: Diagnosis not present

## 2014-12-21 DIAGNOSIS — I472 Ventricular tachycardia, unspecified: Secondary | ICD-10-CM

## 2014-12-21 DIAGNOSIS — Z7982 Long term (current) use of aspirin: Secondary | ICD-10-CM | POA: Insufficient documentation

## 2014-12-21 DIAGNOSIS — Z23 Encounter for immunization: Secondary | ICD-10-CM | POA: Diagnosis not present

## 2014-12-21 DIAGNOSIS — I10 Essential (primary) hypertension: Secondary | ICD-10-CM | POA: Diagnosis not present

## 2014-12-21 DIAGNOSIS — I5022 Chronic systolic (congestive) heart failure: Secondary | ICD-10-CM | POA: Insufficient documentation

## 2014-12-21 DIAGNOSIS — I252 Old myocardial infarction: Secondary | ICD-10-CM | POA: Diagnosis not present

## 2014-12-21 DIAGNOSIS — F17210 Nicotine dependence, cigarettes, uncomplicated: Secondary | ICD-10-CM | POA: Insufficient documentation

## 2014-12-21 DIAGNOSIS — Z95810 Presence of automatic (implantable) cardiac defibrillator: Secondary | ICD-10-CM | POA: Diagnosis not present

## 2014-12-21 DIAGNOSIS — Z9861 Coronary angioplasty status: Secondary | ICD-10-CM | POA: Insufficient documentation

## 2014-12-21 DIAGNOSIS — Z79899 Other long term (current) drug therapy: Secondary | ICD-10-CM | POA: Diagnosis not present

## 2014-12-21 DIAGNOSIS — Z7951 Long term (current) use of inhaled steroids: Secondary | ICD-10-CM | POA: Diagnosis not present

## 2014-12-21 DIAGNOSIS — Z8674 Personal history of sudden cardiac arrest: Secondary | ICD-10-CM | POA: Diagnosis not present

## 2014-12-21 HISTORY — PX: V-TACH ABLATION: SHX6186

## 2014-12-21 HISTORY — DX: Basal cell carcinoma of skin of nose: C44.311

## 2014-12-21 HISTORY — PX: ELECTROPHYSIOLOGIC STUDY: SHX172A

## 2014-12-21 HISTORY — DX: Adverse effect of unspecified anesthetic, initial encounter: T41.45XA

## 2014-12-21 HISTORY — DX: Angina pectoris, unspecified: I20.9

## 2014-12-21 HISTORY — DX: Presence of automatic (implantable) cardiac defibrillator: Z95.810

## 2014-12-21 HISTORY — DX: Anxiety disorder, unspecified: F41.9

## 2014-12-21 HISTORY — DX: Pneumonia, unspecified organism: J18.9

## 2014-12-21 LAB — POCT ACTIVATED CLOTTING TIME
ACTIVATED CLOTTING TIME: 190 s
ACTIVATED CLOTTING TIME: 183 s
Activated Clotting Time: 165 seconds
Activated Clotting Time: 177 seconds
Activated Clotting Time: 183 seconds
Activated Clotting Time: 165 seconds

## 2014-12-21 SURGERY — V TACH ABLATION
Anesthesia: LOCAL

## 2014-12-21 MED ORDER — FENTANYL CITRATE (PF) 100 MCG/2ML IJ SOLN
INTRAMUSCULAR | Status: AC
  Filled 2014-12-21: qty 4

## 2014-12-21 MED ORDER — BENAZEPRIL HCL 10 MG PO TABS
10.0000 mg | ORAL_TABLET | Freq: Every day | ORAL | Status: DC
  Administered 2014-12-22: 10 mg via ORAL
  Filled 2014-12-21: qty 1

## 2014-12-21 MED ORDER — MIDAZOLAM HCL 5 MG/5ML IJ SOLN
INTRAMUSCULAR | Status: DC | PRN
  Administered 2014-12-21 (×5): 1 mg via INTRAVENOUS
  Administered 2014-12-21: 2 mg via INTRAVENOUS
  Administered 2014-12-21 (×6): 1 mg via INTRAVENOUS

## 2014-12-21 MED ORDER — AMIODARONE HCL 200 MG PO TABS
200.0000 mg | ORAL_TABLET | Freq: Two times a day (BID) | ORAL | Status: DC
  Administered 2014-12-21 – 2014-12-22 (×2): 200 mg via ORAL
  Filled 2014-12-21 (×2): qty 1

## 2014-12-21 MED ORDER — HYOSCYAMINE SULFATE 0.125 MG SL SUBL
0.1250 mg | SUBLINGUAL_TABLET | SUBLINGUAL | Status: DC | PRN
  Filled 2014-12-21: qty 1

## 2014-12-21 MED ORDER — FUROSEMIDE 20 MG PO TABS
20.0000 mg | ORAL_TABLET | Freq: Every day | ORAL | Status: DC
  Administered 2014-12-21 – 2014-12-22 (×2): 20 mg via ORAL
  Filled 2014-12-21 (×2): qty 1

## 2014-12-21 MED ORDER — POLYETHYLENE GLYCOL 3350 17 G PO PACK: 17.0000 g | PACK | Freq: Once | ORAL | Status: DC

## 2014-12-21 MED ORDER — BUSPIRONE HCL 5 MG PO TABS
7.5000 mg | ORAL_TABLET | Freq: Two times a day (BID) | ORAL | Status: DC
  Administered 2014-12-21 – 2014-12-22 (×2): 7.5 mg via ORAL
  Filled 2014-12-21 (×2): qty 2

## 2014-12-21 MED ORDER — INFLUENZA VAC SPLIT QUAD 0.5 ML IM SUSY
0.5000 mL | PREFILLED_SYRINGE | INTRAMUSCULAR | Status: AC
  Administered 2014-12-22: 0.5 mL via INTRAMUSCULAR
  Filled 2014-12-21: qty 0.5

## 2014-12-21 MED ORDER — BUDESONIDE-FORMOTEROL FUMARATE 160-4.5 MCG/ACT IN AERO
2.0000 | INHALATION_SPRAY | Freq: Two times a day (BID) | RESPIRATORY_TRACT | Status: DC
  Administered 2014-12-21 – 2014-12-22 (×2): 2 via RESPIRATORY_TRACT
  Filled 2014-12-21: qty 6

## 2014-12-21 MED ORDER — HEPARIN SODIUM (PORCINE) 1000 UNIT/ML IJ SOLN
INTRAMUSCULAR | Status: AC
  Filled 2014-12-21: qty 1

## 2014-12-21 MED ORDER — BUPIVACAINE HCL (PF) 0.25 % IJ SOLN
INTRAMUSCULAR | Status: DC | PRN
  Administered 2014-12-21: 50 mL

## 2014-12-21 MED ORDER — SODIUM CHLORIDE 0.9 % IJ SOLN
3.0000 mL | Freq: Two times a day (BID) | INTRAMUSCULAR | Status: DC
  Administered 2014-12-21: 10 mL via INTRAVENOUS
  Administered 2014-12-22: 3 mL via INTRAVENOUS

## 2014-12-21 MED ORDER — ASPIRIN 81 MG PO CHEW
81.0000 mg | CHEWABLE_TABLET | Freq: Every day | ORAL | Status: DC
  Administered 2014-12-22: 81 mg via ORAL
  Filled 2014-12-21 (×2): qty 1

## 2014-12-21 MED ORDER — CARVEDILOL 12.5 MG PO TABS
12.5000 mg | ORAL_TABLET | Freq: Two times a day (BID) | ORAL | Status: DC
  Administered 2014-12-21 – 2014-12-22 (×2): 12.5 mg via ORAL
  Filled 2014-12-21 (×2): qty 1

## 2014-12-21 MED ORDER — BUPIVACAINE HCL (PF) 0.25 % IJ SOLN
INTRAMUSCULAR | Status: AC
  Filled 2014-12-21: qty 30

## 2014-12-21 MED ORDER — ONDANSETRON HCL 4 MG/2ML IJ SOLN: 4.0000 mg | Freq: Four times a day (QID) | INTRAMUSCULAR | Status: DC | PRN

## 2014-12-21 MED ORDER — SPIRONOLACTONE 25 MG PO TABS
12.5000 mg | ORAL_TABLET | Freq: Every day | ORAL | Status: DC
Start: 2014-12-22 — End: 2014-12-22
  Administered 2014-12-22: 12.5 mg via ORAL
  Filled 2014-12-21: qty 1

## 2014-12-21 MED ORDER — SODIUM CHLORIDE 0.9 % IJ SOLN: 3.0000 mL | INTRAMUSCULAR | Status: DC | PRN

## 2014-12-21 MED ORDER — MIDAZOLAM HCL 5 MG/5ML IJ SOLN
INTRAMUSCULAR | Status: AC
  Filled 2014-12-21: qty 25

## 2014-12-21 MED ORDER — TIOTROPIUM BROMIDE MONOHYDRATE 18 MCG IN CAPS
18.0000 ug | ORAL_CAPSULE | Freq: Every day | RESPIRATORY_TRACT | Status: DC
  Administered 2014-12-22: 18 ug via RESPIRATORY_TRACT
  Filled 2014-12-21: qty 5

## 2014-12-21 MED ORDER — SODIUM CHLORIDE 0.9 % IV SOLN: 250.0000 mL | INTRAVENOUS | Status: DC | PRN

## 2014-12-21 MED ORDER — ATORVASTATIN CALCIUM 80 MG PO TABS
80.0000 mg | ORAL_TABLET | Freq: Every day | ORAL | Status: DC
  Administered 2014-12-21: 80 mg via ORAL
  Filled 2014-12-21: qty 1

## 2014-12-21 MED ORDER — SODIUM CHLORIDE 0.9 % IV SOLN
INTRAVENOUS | Status: DC
  Administered 2014-12-21: 09:00:00 via INTRAVENOUS

## 2014-12-21 MED ORDER — HEPARIN SODIUM (PORCINE) 1000 UNIT/ML IJ SOLN
INTRAMUSCULAR | Status: DC | PRN
  Administered 2014-12-21: 2000 [IU] via INTRAVENOUS
  Administered 2014-12-21: 1000 [IU] via INTRAVENOUS
  Administered 2014-12-21: 6000 [IU] via INTRAVENOUS
  Administered 2014-12-21: 2000 [IU] via INTRAVENOUS
  Administered 2014-12-21: 1000 [IU] via INTRAVENOUS

## 2014-12-21 MED ORDER — IPRATROPIUM-ALBUTEROL 0.5-2.5 (3) MG/3ML IN SOLN: 3.0000 mL | Freq: Four times a day (QID) | RESPIRATORY_TRACT | Status: DC

## 2014-12-21 MED ORDER — TAMSULOSIN HCL 0.4 MG PO CAPS
0.4000 mg | ORAL_CAPSULE | Freq: Every day | ORAL | Status: DC
  Administered 2014-12-21: 0.4 mg via ORAL
  Filled 2014-12-21: qty 1

## 2014-12-21 MED ORDER — ACETAMINOPHEN 325 MG PO TABS: 650.0000 mg | ORAL_TABLET | ORAL | Status: DC | PRN

## 2014-12-21 MED ORDER — FENTANYL CITRATE (PF) 100 MCG/2ML IJ SOLN
INTRAMUSCULAR | Status: DC | PRN
  Administered 2014-12-21 (×4): 12.5 ug via INTRAVENOUS
  Administered 2014-12-21: 25 ug via INTRAVENOUS
  Administered 2014-12-21 (×6): 12.5 ug via INTRAVENOUS

## 2014-12-21 MED ORDER — IPRATROPIUM-ALBUTEROL 0.5-2.5 (3) MG/3ML IN SOLN
3.0000 mL | Freq: Four times a day (QID) | RESPIRATORY_TRACT | Status: DC
  Administered 2014-12-21 – 2014-12-22 (×3): 3 mL via RESPIRATORY_TRACT
  Filled 2014-12-21 (×3): qty 3

## 2014-12-21 MED ORDER — FENTANYL CITRATE (PF) 100 MCG/2ML IJ SOLN: 25.0000 ug | INTRAMUSCULAR | Status: DC | PRN

## 2014-12-21 MED ORDER — PAROXETINE HCL 20 MG PO TABS
40.0000 mg | ORAL_TABLET | Freq: Every morning | ORAL | Status: DC
  Administered 2014-12-22: 40 mg via ORAL
  Filled 2014-12-21: qty 2

## 2014-12-21 MED ORDER — GABAPENTIN 300 MG PO CAPS
300.0000 mg | ORAL_CAPSULE | Freq: Two times a day (BID) | ORAL | Status: DC
  Administered 2014-12-21 – 2014-12-22 (×2): 300 mg via ORAL
  Filled 2014-12-21 (×2): qty 1

## 2014-12-21 MED ORDER — FENTANYL CITRATE (PF) 100 MCG/2ML IJ SOLN
25.0000 ug | INTRAMUSCULAR | Status: DC | PRN
  Administered 2014-12-21: 25 ug via INTRAVENOUS

## 2014-12-21 MED ORDER — FENTANYL CITRATE (PF) 100 MCG/2ML IJ SOLN
INTRAMUSCULAR | Status: AC
  Filled 2014-12-21: qty 2

## 2014-12-21 MED ORDER — NITROGLYCERIN 0.4 MG SL SUBL: 0.4000 mg | SUBLINGUAL_TABLET | SUBLINGUAL | Status: DC | PRN

## 2014-12-21 SURGICAL SUPPLY — 15 items
BAG SNAP BAND KOVER 36X36 (MISCELLANEOUS) ×1 IMPLANT
CATH JOSEPH QUAD ALLRED 6F REP (CATHETERS) ×1 IMPLANT
CATH NAVISTAR SMARTTOUCH DF (ABLATOR) ×1 IMPLANT
CATH POLARIS X 2.5/5/2.5 DECAP (CATHETERS) ×1 IMPLANT
PACK EP LATEX FREE (CUSTOM PROCEDURE TRAY) ×2
PACK EP LF (CUSTOM PROCEDURE TRAY) IMPLANT
PAD DEFIB LIFELINK (PAD) ×1 IMPLANT
PATCH CARTO3 (PAD) ×1 IMPLANT
SHEATH PINNACLE 6F 10CM (SHEATH) ×2 IMPLANT
SHEATH PINNACLE 7F 10CM (SHEATH) ×1 IMPLANT
SHEATH PINNACLE 8F 10CM (SHEATH) ×2 IMPLANT
SHIELD RADPAD SCOOP 12X17 (MISCELLANEOUS) ×1 IMPLANT
TUBING ART PRESS 72  MALE/FEM (TUBING) ×1
TUBING ART PRESS 72 MALE/FEM (TUBING) IMPLANT
TUBING SMART ABLATE COOLFLOW (TUBING) ×1 IMPLANT

## 2014-12-21 NOTE — H&P (View-Only) (Signed)
HPI [name] returns today for ongoing evaluation and management of his ICD and ventricular tachycardia. He is a very pleasant 74 year old man with known coronary disease, status post MI, who developed ventricular tachycardia and underwent ICD implantation several years ago. Because of recurrent ventricular tachycardia, he was placed on amiodarone therapy. He has had recurrent VT despite fairly high dose amiodarone.  He has been hospitalized with recurrent VT. He has also had VT after ERCP.  Allergies  Allergen Reactions  . Sulfa Antibiotics Hives     Current Outpatient Prescriptions  Medication Sig Dispense Refill  . amiodarone (PACERONE) 200 MG tablet Take 200 mg by mouth 2 (two) times daily. Take one tablet by mouth in the morning and one by mouth in the evening    . aspirin 81 MG tablet Take 81 mg by mouth daily.    [name] atorvastatin (LIPITOR) 80 MG tablet Take 1 tablet (80 mg total) by mouth daily. 30 tablet 6  . benazepril (LOTENSIN) 10 MG tablet Take 1 tablet (10 mg total) by mouth daily. 90 tablet 3  . budesonide-formoterol (SYMBICORT) 160-4.5 MCG/ACT inhaler Inhale 2 puffs into the lungs first thing in the am and then inhale another 2 puffs into the lungs about 12 hours later.    . busPIRone (BUSPAR) 15 MG tablet Take 15 mg by mouth 3 (three) times daily. Take one half tablet by mouth in the morning and one half tablet in the evening.    . carvedilol (COREG) 12.5 MG tablet Take 1 tablet (12.5 mg total) by mouth 2 (two) times daily with a meal. 180 tablet 3  . cetirizine (ZYRTEC) 10 MG tablet Take 10 mg by mouth daily as needed for allergies.    . Choline Fenofibrate (TRILIPIX) 135 MG capsule Take 1 capsule (135 mg total) by mouth daily. 30 capsule 1  . furosemide (LASIX) 40 MG tablet Take 20 mg by mouth daily.     [name] gabapentin (NEURONTIN) 100 MG capsule Take 300 mg by mouth 2 (two) times daily.   4  . hyoscyamine (LEVSIN SL) 0.125 MG SL tablet Dissolve 1 tablet under the tongue  every 4 hours as needed for gallbladder pain  0  . Ipratropium-Albuterol (COMBIVENT RESPIMAT) 20-100 MCG/ACT AERS respimat Inhale 1 puff into the lungs 4 (four) times daily. Shortness of breath or wheezing (Patient taking differently: Inhale 1 puff into the lungs every 6 (six) hours as needed for wheezing. Shortness of breath or wheezing) 1 Inhaler 5  . nitroGLYCERIN (NITROSTAT) 0.4 MG SL tablet Place 1 tablet (0.4 mg total) under the tongue every 5 (five) minutes as needed for chest pain. 25 tablet 12  . omeprazole (PRILOSEC) 20 MG capsule Take 20 mg by mouth 2 (two) times daily.    [name] PARoxetine (PAXIL) 40 MG tablet take 1 tablet by mouth every morning 30 tablet 11  . polyethylene glycol (MIRALAX / GLYCOLAX) packet Take 17 g by mouth daily as needed. (CONSTIPATION)  0  . spironolactone (ALDACTONE) 25 MG tablet Take 0.5 tablets (12.5 mg total) by mouth daily. 45 tablet 2  . tamsulosin (FLOMAX) 0.4 MG CAPS capsule take 1 capsule by mouth daily AFTER SUPPER 30 capsule 3  . tiotropium (SPIRIVA) 18 MCG inhalation capsule Place 1 capsule (18 mcg total) into inhaler and inhale daily. 30 capsule 3  . varenicline (CHANTIX PAK) 0.5 MG X 11 & 1 MG X 42 tablet Take one 0.5 mg tablet by mouth once daily for 3 days, then  increase to one 0.5 mg tablet twice daily for 4 days, then increase to one 1 mg tablet twice daily. 53 tablet 3   No current facility-administered medications for this visit.     Past Medical History  Diagnosis Date  . Hypertension   . Hyperlipidemia   . COPD (chronic obstructive pulmonary disease)     a. followed by pulmonary, COPD GOLD stage II  . GERD (gastroesophageal reflux disease)   . Diverticulosis of colon 07/2014    noted on CT  . CAD (coronary artery disease)     a. s/p MI in 1994 with PCI to LAD at that time b. cath 10/2012 demonstrated EF 30%, inferior akinesis with mild hypokinesis of all walls, patent LAD and RCA stents; ostial PDA with 80-90% obstruction with medical  therapy recommended   . Depression   . Aneurysm     abdominal <4. 3.6 cm on ultrasound 09/2014.   . Tobacco abuse   . Ventricular tachycardia     a. 08/2009 s/p BSX E110 Teligen 100 AICD, ser#: [id number];  b. 08/2008 VT req ATP - detection reprogrammed from 160 to 150.  [name] Chronic systolic CHF (congestive heart failure)     EF 30 to 35 % as of 09/2014.   [name] Hiatal hernia   . Prostate enlargement 07/2014    observed on CT  . Hyperglycemia 10/2012.    ROS:   All systems reviewed and negative except as noted in the HPI.   Past Surgical History  Procedure Laterality Date  . Hernia repair    . Foot surgery      lt  . Hemorrhoid banding    . Retinal detachment surgery      right  . Nose surgery      skin cancer-mohs-skin graft  . Colonoscopy    . Tenolysis Right 12/21/2013    Procedure: TENOLYSIS FLEXOR CARPI RADIALIS ,DEBRIDEMENT RIGHT JOINT WRIST,DEBRIDEMENT SCAPHOTRAPEZIAL TRAPEZOID, REPAIR OF EXTENSOR HOOD;  Surgeon: Daryll Brod, MD;  Location: [facility];  Service: Orthopedics;  Laterality: Right;  . Left heart catheterization with coronary angiogram N/A 11/25/2012    demonstrated EF 30%, inferior akinesis with mild hypokinesis of all walls, patent LAD and RCA stents; ostial PDA with 80-90% obstruction with medical therapy recommended  . Implantable cardioverter defibrillator implant  09/06/09    BSX dual chamber ICD implanted in [place] for cardiac arrest 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  . Ercp N/A 11/16/2014    Procedure: ENDOSCOPIC RETROGRADE CHOLANGIOPANCREATOGRAPHY (ERCP);  Surgeon: [name], MD;  Location: [facility];  Service: Endoscopy;  Laterality: N/A;     Family History  Problem Relation Age of Onset  . Heart attack Brother   . CAD Father   . CAD Mother   . Hypertension Mother   . Hypertension Father   . Hypertension Brother   . Stroke Neg Hx      Social History   Social History  . Marital Status: Married    Spouse Name: N/A  . Number of Children:  N/A  . Years of Education: N/A   Occupational History  . Retired    Social History Main Topics  . Smoking status: Current Every Day Smoker -- 1.00 packs/day for 55 years    Types: Cigarettes  . Smokeless tobacco: Never Used     Comment: attending the "stop smoking" classes through [place]  . Alcohol Use: 0.0 oz/week    0 Standard drinks or equivalent per week     Comment:  occasionally   . Drug Use: No  . Sexual Activity: Not Currently   Other Topics Concern  . Not on file   Social History Narrative     BP 92/66 mmHg  Pulse 60  Ht 5\' 11"  (1.803 m)  Wt 216 lb (97.977 kg)  BMI 30.14 kg/m2  Physical Exam:  Well appearing 74 year old man, NAD HEENT: Unremarkable Neck:  7 cm JVD, no thyromegally Back:  No CVA tenderness Lungs:  Clear with no wheezes, rales, or rhonchi. HEART:  Regular rate rhythm, no murmurs, no rubs, no clicks Abd:  soft, positive bowel sounds, no organomegally, no rebound, no guarding Ext:  2 plus pulses, no edema, no cyanosis, no clubbing Skin:  No rashes no nodules Neuro:  CN II through XII intact, motor grossly intact  DEVICE  Normal device function.  See PaceArt for details.   Assess/Plan:

## 2014-12-21 NOTE — Progress Notes (Signed)
Site area: rt groin fa sheath and 3 rt fv sheaths Site Prior to Removal:  Level  0 Pressure Applied For:  30 minutes Manual:   yes Patient Status During Pull:  stable Post Pull Site:  Level  0 Post Pull Instructions Given: yes  Post Pull Pulses Present: yes Dressing Applied:  tegaderm Bedrest begins @   [id number] Comments:  IV saline locked

## 2014-12-21 NOTE — Interval H&P Note (Signed)
History and Physical Interval Note:  12/21/2014 11:33 AM  [name]  has presented today for surgery, with the diagnosis of VT  The various methods of treatment have been discussed with the patient and family. After consideration of risks, benefits and other options for treatment, the patient has consented to  Procedure(s): V Tach Ablation (N/A) as a surgical intervention .  The patient's history has been reviewed, patient examined, no change in status, stable for surgery.  I have reviewed the patient's chart and labs.  Questions were answered to the patient's satisfaction.     Steven Guzman

## 2014-12-22 ENCOUNTER — Encounter (HOSPITAL_COMMUNITY): Payer: Self-pay | Admitting: Physician Assistant

## 2014-12-22 DIAGNOSIS — I472 Ventricular tachycardia: Secondary | ICD-10-CM | POA: Diagnosis not present

## 2014-12-22 DIAGNOSIS — I5022 Chronic systolic (congestive) heart failure: Secondary | ICD-10-CM | POA: Diagnosis not present

## 2014-12-22 DIAGNOSIS — Z23 Encounter for immunization: Secondary | ICD-10-CM | POA: Diagnosis not present

## 2014-12-22 DIAGNOSIS — I10 Essential (primary) hypertension: Secondary | ICD-10-CM | POA: Diagnosis not present

## 2014-12-22 DIAGNOSIS — I2510 Atherosclerotic heart disease of native coronary artery without angina pectoris: Secondary | ICD-10-CM | POA: Diagnosis not present

## 2014-12-22 DIAGNOSIS — J449 Chronic obstructive pulmonary disease, unspecified: Secondary | ICD-10-CM | POA: Diagnosis not present

## 2014-12-22 NOTE — Progress Notes (Signed)
Patient ID: LOTT SEELBACH, male   DOB: [birth year], 74 y.o.   MRN: [id number]    Patient Name: Steven Guzman Date of Encounter: 12/22/2014     Active Problems:   Ventricular tachycardia    SUBJECTIVE  No chest pain or sob or palpitations  CURRENT MEDS . amiodarone  200 mg Oral BID  . aspirin  81 mg Oral Daily  . atorvastatin  80 mg Oral q1800  . benazepril  10 mg Oral Daily  . budesonide-formoterol  2 puff Inhalation BID  . busPIRone  7.5 mg Oral BID  . carvedilol  12.5 mg Oral BID WC  . furosemide  20 mg Oral Daily  . gabapentin  300 mg Oral BID  . ipratropium-albuterol  3 mL Inhalation QID  . PARoxetine  40 mg Oral q morning - 10a  . polyethylene glycol  17 g Oral Once  . sodium chloride  3 mL Intravenous Q12H  . spironolactone  12.5 mg Oral Daily  . tamsulosin  0.4 mg Oral QPC supper  . tiotropium  18 mcg Inhalation Daily    OBJECTIVE  Filed Vitals:   12/22/14 0017 12/22/14 0500 12/22/14 0803 12/22/14 0811  BP: 98/59 104/66  105/67  Pulse: 59 59  59  Temp: 97.9 F (36.6 C) 98.5 F (36.9 C)  98.7 F (37.1 C)  TempSrc: Oral Oral    Resp: 15 18  20   Height:      Weight:  217 lb 4.8 oz (98.567 kg)    SpO2: 97% 95% 94% 98%    Intake/Output Summary (Last 24 hours) at 12/22/14 1049 Last data filed at 12/22/14 0812  Gross per 24 hour  Intake    390 ml  Output   1100 ml  Net   -710 ml   Filed Weights   12/21/14 0725 12/22/14 0500  Weight: 213 lb (96.616 kg) 217 lb 4.8 oz (98.567 kg)    PHYSICAL EXAM  General: Pleasant, NAD. Neuro: Alert and oriented X 3. Moves all extremities spontaneously. Psych: Normal affect. HEENT:  Normal  Neck: Supple without bruits or JVD. Lungs:  Resp regular and unlabored, CTA. Heart: RRR no s3, s4, or murmurs. Abdomen: Soft, non-tender, non-distended, BS + x 4.  Extremities: No clubbing, cyanosis or edema. DP/PT/Radials 2+ and equal bilaterally.  Accessory Clinical Findings  CBC No results for input(s): WBC,  NEUTROABS, HGB, HCT, MCV, PLT in the last 72 hours. Basic Metabolic Panel No results for input(s): NA, K, CL, CO2, GLUCOSE, BUN, CREATININE, CALCIUM, MG, PHOS in the last 72 hours. Liver Function Tests No results for input(s): AST, ALT, ALKPHOS, BILITOT, PROT, ALBUMIN in the last 72 hours. No results for input(s): LIPASE, AMYLASE in the last 72 hours. Cardiac Enzymes No results for input(s): CKTOTAL, CKMB, CKMBINDEX, TROPONINI in the last 72 hours. BNP Invalid input(s): POCBNP D-Dimer No results for input(s): DDIMER in the last 72 hours. Hemoglobin A1C No results for input(s): HGBA1C in the last 72 hours. Fasting Lipid Panel No results for input(s): CHOL, HDL, LDLCALC, TRIG, CHOLHDL, LDLDIRECT in the last 72 hours. Thyroid Function Tests No results for input(s): TSH, T4TOTAL, T3FREE, THYROIDAB in the last 72 hours.  Invalid input(s): FREET3  TELE  nsr with atrial pacing  Radiology/Studies  No results found.  ASSESSMENT AND PLAN  1. VT - the patient has had recurrent VT on amiodarone and has undergone catheter ablation. He was found to have 6 different VT's and underwent an encircling lesion set around his extensive septal  scar. He had incessant VT during the procedure and has had none since. I have asked the patient to continue amio, s/p catheter ablation. I will see him back in the office in several weeks (4-5). Continue amiodarone at the current dose. [place] for discharge home. 2. Chronic systolic heart failure - He will continue his current medical therapy.   Mckaylie Vasey,M.D.  12/22/2014 10:49 AM

## 2014-12-22 NOTE — Discharge Instructions (Signed)
Keep cath site clean and dry. No shower for 24 hours. No strenuous activity for 1 week. Contact cardiology if has significant discomfort of cath site.

## 2014-12-22 NOTE — Discharge Summary (Signed)
Discharge Summary   Patient ID: Steven Guzman,  MRN: [id number], DOB/AGE: [birth year] 74 y.o.  Admit date: 12/21/2014 Discharge date: 12/22/2014  Primary Care Provider: Vertell Novak A Primary Cardiologist: Dr. Tamala Julian Primary electrophysiologist: Dr. Lovena Le  Discharge Diagnoses Active Problems:   Ventricular tachycardia   Allergies Allergies  Allergen Reactions  . Sulfa Antibiotics Hives    Procedures  EP study with Ablation for recurrent VT on 12/22/2014  Results: 1. Baseline ECG. The baseline ECG demonstrates sinus rhythm with normal axis and intervals.  2. Baseline intervals. The PR interval was 204 ms. The QRS duration was 129 ms. The sinus node cycle length was 1000 ms. Following catheter ablation, there is no significant change in these intervals. The HV interval measured 60 ms. 3. Rapid ventricular pacing. Rapid ventricular pacing was carried out during the ablation procedure demonstrating VA dissociation at 600 ms. Prior to ablation, there was easily inducible ventricular tachycardia. 4. Rapid ventricular pacing. Rapid ventricular pacing demonstrated VA dissociation at 600 ms. 5. Rapid atrial pacing. Rapid atrial pacing was carried out from the coronary sinus at a pacing cycle length of 600 ms and demonstrated AV Wenckebach. 6. Programmed atrial stimulation. Programmed atrial stimulation was carried out from the coronary sinus at a based just cycle length of 600 ms. AV Wenckebach was observed. 7. Arrhythmias observed. There were 6 inducible and spontaneously occurring ventricular tachycardias. These had mostly superior axis ease and right bundle branch block QRS morphology. There was one inferior axis ventricular tachycardia with right bundle branch morphology. The cycle length varied from 440 ms all the way up to 550 ms. There are right bundle branch block QRS morphology. 8. Mapping. Three-dimensional electro anatomic mapping was carried out as noted above. The patient  had no additional ventricular tachycardia after catheter ablation. 9. Radio frequency energy application. A total of 29 radiofrequency energy applications for duration of 54 minutes was carried out in and around the patient's extensive septal scar which involved much of the left ventricular septum.  Conclusion: Apparent successful catheter ablation of ventricular tachycardia with encircling lesions around the extensive scar on the left ventricular septum. Following ablation, there is no additional ventricular tachycardia.    Hospital Course  The patient is a 74 year old male with past medical history of hypertension, CAD, history of tobacco abuse, chronic systolic heart failure with EF 30-35% in 7/16, COPD, and history of recurrent VT. Patient initially had anterior MI in 1994 s/p PCI to LAD. He later underwent a cardiac catheterization in August 2014 demonstrating EF 30%, inferior akinesis with moderate hypokinesis in all walls, patent LAD and RCA stent, ostial PDA with 80-90% obstruction with recommendation for medical therapy. He underwent dual-chamber ICD implantation in 2011 while living in [place] at the time due to cardiac arrest and inducible VT on EP study. He was maintained on amiodarone for several years, however has had increasing VT burden recently. He has had ICD shock in December 2015 when he was off his medication. He was admitted in June 2016 with VT below his detection rate and his ICD was reprogrammed. He was last seen in the office by Dr. Lovena Le on 12/12/2014, it was noted he has had recurrent SVT despite high on high-dose amiodarone, refractory to ATP but treated successfully in the past with ICD shock. After discussing with the patient various options, he agreed to undergo another EP study with catheter ablation procedure. His device was interrogated in the office and was working normally.  Patient presented to the scheduled outpatient EP  study on 12/21/2014. Rapid ventricular pacing  was carried out during the ablation procedure demonstrating VA dissociation at 600 ms, prior to ablation there was easily inducible ventricular tachycardia. There were 6 inducible and spontaneously occurring ventricular tachycardia, this had mostly superior axis and a right bundle branch block QRS morphology, although there was one inferior axis ventricular tachycardia with RBBB. Three-dimensional electro anatomic mapping was carried out, following ablation of extensive septal scar, patient had no additional ventricular tachycardia.  Patient was seen on the following morning on 12/22/2014, at which time he was doing well. He is deemed stable for discharge from electrophysiology perspective.    Discharge Vitals Blood pressure 105/67, pulse 59, temperature 98.7 F (37.1 C), temperature source Oral, resp. rate 20, height 5\' 11"  (1.803 m), weight 217 lb 4.8 oz (98.567 kg), SpO2 98 %.  Filed Weights   12/21/14 0725 12/22/14 0500  Weight: 213 lb (96.616 kg) 217 lb 4.8 oz (98.567 kg)    Disposition  Pt is being discharged home today in good condition.  Follow-up Plans & Appointments      Follow-up Information    Follow up with Patsey Berthold, NP On 01/23/2015.   Specialty:  Nurse Practitioner   Why:  3:20pm.   Contact information:   [address] [place] [address] [phone]       Follow up with Cristopher Peru, MD On 03/15/2015.   Specialty:  Cardiology   Why:  3:00pm   Contact information:   [address] [place] [address] [phone]       Discharge Medications    Medication List    TAKE these medications        amiodarone 200 MG tablet  Commonly known as:  PACERONE  Take 200 mg by mouth 2 (two) times daily. Take one tablet by mouth in the morning and one by mouth in the evening     aspirin 81 MG tablet  Take 81 mg by mouth daily.     atorvastatin 80 MG tablet  Commonly known as:  LIPITOR  Take 1 tablet (80 mg total) by mouth daily.       benazepril 10 MG tablet  Commonly known as:  LOTENSIN  Take 1 tablet (10 mg total) by mouth daily.     budesonide-formoterol 160-4.5 MCG/ACT inhaler  Commonly known as:  SYMBICORT  Inhale 2 puffs into the lungs first thing in the am and then inhale another 2 puffs into the lungs about 12 hours later.     busPIRone 15 MG tablet  Commonly known as:  BUSPAR  Take 7.5 mg by mouth 2 (two) times daily. Take one half tablet by mouth in the morning and one half tablet in the evening.     carvedilol 12.5 MG tablet  Commonly known as:  COREG  Take 1 tablet (12.5 mg total) by mouth 2 (two) times daily with a meal.     cetirizine 10 MG tablet  Commonly known as:  ZYRTEC  Take 10 mg by mouth daily as needed for allergies.     Choline Fenofibrate 135 MG capsule  Commonly known as:  TRILIPIX  Take 1 capsule (135 mg total) by mouth daily.     furosemide 40 MG tablet  Commonly known as:  LASIX  Take 20 mg by mouth daily.     gabapentin 100 MG capsule  Commonly known as:  NEURONTIN  Take 300 mg by mouth 2 (two) times daily.     hyoscyamine  0.125 MG SL tablet  Commonly known as:  LEVSIN SL  Dissolve 1 tablet under the tongue every 4 hours as needed for gallbladder pain     Ipratropium-Albuterol 20-100 MCG/ACT Aers respimat  Commonly known as:  COMBIVENT RESPIMAT  Inhale 1 puff into the lungs 4 (four) times daily. Shortness of breath or wheezing     nitroGLYCERIN 0.4 MG SL tablet  Commonly known as:  NITROSTAT  Place 1 tablet (0.4 mg total) under the tongue every 5 (five) minutes as needed for chest pain.     omeprazole 20 MG capsule  Commonly known as:  PRILOSEC  Take 20 mg by mouth 2 (two) times daily.     PARoxetine 40 MG tablet  Commonly known as:  PAXIL  take 1 tablet by mouth every morning     polyethylene glycol packet  Commonly known as:  MIRALAX / GLYCOLAX  Take 17 g by mouth daily as needed. (CONSTIPATION)     spironolactone 25 MG tablet  Commonly known as:   ALDACTONE  Take 0.5 tablets (12.5 mg total) by mouth daily.     tamsulosin 0.4 MG Caps capsule  Commonly known as:  FLOMAX  take 1 capsule by mouth daily AFTER SUPPER     tiotropium 18 MCG inhalation capsule  Commonly known as:  SPIRIVA  Place 1 capsule (18 mcg total) into inhaler and inhale daily.     varenicline 0.5 MG X 11 & 1 MG X 42 tablet  Commonly known as:  CHANTIX PAK  Take one 0.5 mg tablet by mouth once daily for 3 days, then increase to one 0.5 mg tablet twice daily for 4 days, then increase to one 1 mg tablet twice daily.        Duration of Discharge Encounter   Greater than 30 minutes including physician time.  Hilbert Corrigan PA-C Pager: [phone] 12/22/2014, 11:08 AM    EP Attending  Patient seen and examined. Agree with above. The patient is stable for discharge and has had no additional VT.   Mikle Bosworth.D.

## 2014-12-22 NOTE — Progress Notes (Signed)
Pt discharging teaching and instructions reviewed with pt, VSS. Pt discharging home with wife.

## 2014-12-24 ENCOUNTER — Encounter (HOSPITAL_COMMUNITY): Payer: Self-pay | Admitting: Internal Medicine

## 2014-12-24 ENCOUNTER — Telehealth: Payer: Self-pay | Admitting: *Deleted

## 2014-12-24 NOTE — Telephone Encounter (Signed)
Pt was on TCM list D/C 12/22/14 pt had procedure done " EP study with ablation for recurrent VT. Pt will be following up with cardiologist. Did not make TCM appt with PCP...Steven Guzman

## 2014-12-27 ENCOUNTER — Other Ambulatory Visit: Payer: Self-pay | Admitting: Internal Medicine

## 2015-01-01 ENCOUNTER — Telehealth: Payer: Self-pay | Admitting: Internal Medicine

## 2015-01-01 NOTE — Telephone Encounter (Signed)
New message    Patient calling  C/O hard place under skin - groin site - h/o procedure done a week ago Friday.    No chest pain  No sob.

## 2015-01-01 NOTE — Telephone Encounter (Signed)
Spoke with wife and let her know this is normal and we can take a look at it on his follow up Thurs.  Dr Lovena Le aware

## 2015-01-03 ENCOUNTER — Ambulatory Visit: Payer: Medicare Other | Admitting: Internal Medicine

## 2015-01-04 ENCOUNTER — Other Ambulatory Visit: Payer: Self-pay

## 2015-01-04 MED ORDER — FUROSEMIDE 40 MG PO TABS: 20.0000 mg | ORAL_TABLET | Freq: Every day | ORAL | Status: DC

## 2015-01-09 ENCOUNTER — Telehealth: Payer: Self-pay | Admitting: Pulmonary Disease

## 2015-01-09 MED ORDER — VARENICLINE TARTRATE 1 MG PO TABS: 1.0000 mg | ORAL_TABLET | Freq: Two times a day (BID) | ORAL | Status: DC

## 2015-01-09 NOTE — Telephone Encounter (Signed)
Spoke with pt. Pt is wanting the second part of the Chantix prescritpion. The starter pack was sent in on 10/18/14. Pt is currently taking 1 tablet BID.  BQ - please advise. Thanks.

## 2015-01-09 NOTE — Telephone Encounter (Signed)
OK to refill 1mg  po bid dispense 60 refill 3

## 2015-01-09 NOTE — Telephone Encounter (Signed)
Rx has been sent in per BQ. Pt is aware. Nothing further was needed. 

## 2015-01-11 ENCOUNTER — Telehealth: Payer: Self-pay | Admitting: *Deleted

## 2015-01-11 ENCOUNTER — Other Ambulatory Visit: Payer: Self-pay

## 2015-01-11 MED ORDER — ATORVASTATIN CALCIUM 80 MG PO TABS: 80.0000 mg | ORAL_TABLET | Freq: Every day | ORAL | Status: DC

## 2015-01-11 NOTE — Telephone Encounter (Signed)
initiated PA for Chantix 1mg  thru Cover My Meds. Key: [id number] Pt ID: [id number] Sent for review. Will await response.

## 2015-01-13 ENCOUNTER — Other Ambulatory Visit: Payer: Self-pay | Admitting: Internal Medicine

## 2015-01-18 ENCOUNTER — Other Ambulatory Visit: Payer: Self-pay

## 2015-01-18 DIAGNOSIS — I472 Ventricular tachycardia, unspecified: Secondary | ICD-10-CM

## 2015-01-18 DIAGNOSIS — Z95810 Presence of automatic (implantable) cardiac defibrillator: Secondary | ICD-10-CM

## 2015-01-18 DIAGNOSIS — I5022 Chronic systolic (congestive) heart failure: Secondary | ICD-10-CM

## 2015-01-18 MED ORDER — AMIODARONE HCL 200 MG PO TABS: 200.0000 mg | ORAL_TABLET | Freq: Two times a day (BID) | ORAL | Status: DC

## 2015-01-21 NOTE — Telephone Encounter (Signed)
Received approval for Chantix. [id number] Pharmacy informed.

## 2015-01-23 ENCOUNTER — Ambulatory Visit (INDEPENDENT_AMBULATORY_CARE_PROVIDER_SITE_OTHER): Payer: Medicare Other | Admitting: Nurse Practitioner

## 2015-01-23 ENCOUNTER — Encounter: Payer: Self-pay | Admitting: Nurse Practitioner

## 2015-01-23 VITALS — BP 98/60 | HR 60 | Ht 71.0 in | Wt 218.2 lb

## 2015-01-23 DIAGNOSIS — I10 Essential (primary) hypertension: Secondary | ICD-10-CM

## 2015-01-23 DIAGNOSIS — I255 Ischemic cardiomyopathy: Secondary | ICD-10-CM | POA: Diagnosis not present

## 2015-01-23 DIAGNOSIS — I5022 Chronic systolic (congestive) heart failure: Secondary | ICD-10-CM

## 2015-01-23 DIAGNOSIS — I472 Ventricular tachycardia, unspecified: Secondary | ICD-10-CM

## 2015-01-23 DIAGNOSIS — Z95810 Presence of automatic (implantable) cardiac defibrillator: Secondary | ICD-10-CM

## 2015-01-23 MED ORDER — AMIODARONE HCL 200 MG PO TABS: 200.0000 mg | ORAL_TABLET | Freq: Every day | ORAL | Status: DC

## 2015-01-23 NOTE — Patient Instructions (Signed)
Medication Instructions:   START TAKING AMIODARONE ONCE A DAY   If you need a refill on your cardiac medications before your next appointment, please call your pharmacy.  Labwork: NONE ORDER TODAY    Testing/Procedures: NONE ORDER TODAY    Follow-Up:  AS SCHEDULED   Any Other Special Instructions Will Be Listed Below (If Applicable).

## 2015-01-23 NOTE — Progress Notes (Addendum)
Electrophysiology Office Note Date: 01/23/2015  ID:  Steven Guzman 1940-07-29, MRN [id number]  PCP: Hoyt Koch, MD Primary Cardiologist: Tamala Julian Electrophysiologist: Lovena Le  CC: VT ablation follow up  Steven Guzman is a 74 y.o. male seen today for Dr Lovena Le.  He underwent dual chamber ICD implantation in 2011 while living in [place] at the time of cardiac arrest 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.  He has been maintained on amiodarone for several years and was found to have increasing VT burden and underwent scar modification of VT last month.  Since discharge, he reports doing reasonably well.   He denies chest pain, palpitations, dyspnea, PND, orthopnea, nausea, vomiting, syncope, edema, weight gain, or early satiety.  He has not had ICD shocks.  He reports stable orthostatic intolerance.   Device History: BSX dual chamber ICD implanted 2011 for secondary prevention History of appropriate therapy: yes History of AAD therapy: yes - amiodarone; VT ablation 11/2014   Past Medical History  Diagnosis Date  . Hypertension   . Hyperlipidemia   . GERD (gastroesophageal reflux disease)   . Diverticulosis of colon 07/2014    noted on CT  . CAD (coronary artery disease)     a. s/p MI in 1994 with PCI to LAD at that time b. cath 10/2012 demonstrated EF 30%, inferior akinesis with mild hypokinesis of all walls, patent LAD and RCA stents; ostial PDA with 80-90% obstruction with medical therapy recommended   . Depression   . Aneurysm ([place])     abdominal <4. 3.6 cm on ultrasound 09/2014.   . Tobacco abuse   . Ventricular tachycardia ([place])     a. 08/2009 s/p BSX E110 Teligen 100 AICD, ser#: [id number];  b. 08/2008 VT req ATP - detection reprogrammed from 160 to 150. c. EPS and VT ablation by Dr. Lovena Le 12/21/2014  . Chronic systolic CHF (congestive heart failure) (HCC)     EF 30 to 35 % as of 09/2014.   [name] Hiatal hernia   . Prostate enlargement 07/2014    observed on CT  . Hyperglycemia  10/2012.  [name] AICD (automatic cardioverter/defibrillator) present   . Basal cell carcinoma of nose     S/P MOHS  . Complication of anesthesia 10/2014    "had to have defibrillator w/ERCP"  . Anginal pain ([place])   . Myocardial infarction [facility]) 1994; 2011  . COPD (chronic obstructive pulmonary disease) ([place])     a. followed by pulmonary, COPD GOLD stage II  . Pneumonia 1946; 2015  . Chronic bronchitis ([place])     "was having it q yr; haven't had it in a couple years" (12/21/2014)  . Anxiety    Past Surgical History  Procedure Laterality Date  . Foot surgery Left 2005    "fixed bone that stuck out in my ankle area"  . Hemorrhoid banding    . Retinal detachment surgery Right 2013  . Mohs surgery  2008    nose, skin graft  . Colonoscopy    . Tenolysis Right 12/21/2013    Procedure: TENOLYSIS FLEXOR CARPI RADIALIS ,DEBRIDEMENT RIGHT JOINT WRIST,DEBRIDEMENT SCAPHOTRAPEZIAL TRAPEZOID, REPAIR OF EXTENSOR HOOD;  Surgeon: Daryll Brod, MD;  Location: [place];  Service: Orthopedics;  Laterality: Right;  . Left heart catheterization with coronary angiogram N/A 11/25/2012    demonstrated EF 30%, inferior akinesis with mild hypokinesis of all walls, patent LAD and RCA stents; ostial PDA with 80-90% obstruction with medical therapy recommended  . Implantable cardioverter defibrillator implant  09/06/09    BSX dual chamber ICD implanted in [place] for cardiac arrest 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  . Ercp N/A 11/16/2014    Procedure: ENDOSCOPIC RETROGRADE CHOLANGIOPANCREATOGRAPHY (ERCP);  Surgeon: [name], MD;  Location: [place];  Service: Endoscopy;  Laterality: N/A;  . V-tach ablation  12/21/2014  . Inguinal hernia repair Right ~ 1995  . Eye surgery    . Cataract extraction w/ intraocular lens  implant, bilateral Bilateral ~ 2011  . Electrophysiologic study N/A 12/21/2014    Procedure: V Tach Ablation;  Surgeon: [name], MD;  Location: [place] [facility];  Service:  Cardiovascular;  Laterality: N/A;    Current Outpatient Prescriptions  Medication Sig Dispense Refill  . amiodarone (PACERONE) 200 MG tablet Take 1 tablet (200 mg total) by mouth daily. Take one tablet by mouth in the morning and one by mouth in the evening 60 tablet 2  . aspirin 81 MG tablet Take 81 mg by mouth daily.    [name] atorvastatin (LIPITOR) 80 MG tablet Take 1 tablet (80 mg total) by mouth daily. 30 tablet 6  . benazepril (LOTENSIN) 10 MG tablet Take 1 tablet (10 mg total) by mouth daily. 90 tablet 3  . budesonide-formoterol (SYMBICORT) 160-4.5 MCG/ACT inhaler Inhale 2 puffs into the lungs first thing in the am and then inhale another 2 puffs into the lungs about 12 hours later.    . busPIRone (BUSPAR) 15 MG tablet Take 7.5 mg by mouth 2 (two) times daily. Take one half tablet by mouth in the morning and one half tablet in the evening.    . carvedilol (COREG) 12.5 MG tablet Take 1 tablet (12.5 mg total) by mouth 2 (two) times daily with a meal. 180 tablet 3  . cetirizine (ZYRTEC) 10 MG tablet Take 10 mg by mouth daily as needed for allergies.    . Choline Fenofibrate (TRILIPIX) 135 MG capsule Take 1 capsule (135 mg total) by mouth daily. 30 capsule 1  . furosemide (LASIX) 40 MG tablet Take 0.5 tablets (20 mg total) by mouth daily. 30 tablet 3  . gabapentin (NEURONTIN) 100 MG capsule take 3 capsules by mouth twice a day 540 capsule 2  . hyoscyamine (LEVSIN SL) 0.125 MG SL tablet Dissolve 1 tablet under the tongue every 4 hours as needed for gallbladder pain  0  . Ipratropium-Albuterol (COMBIVENT RESPIMAT) 20-100 MCG/ACT AERS respimat Inhale 1 puff into the lungs 4 (four) times daily. Shortness of breath or wheezing (Patient taking differently: Inhale 1 puff into the lungs every 6 (six) hours as needed for wheezing. Shortness of breath or wheezing) 1 Inhaler 5  . nitroGLYCERIN (NITROSTAT) 0.4 MG SL tablet Place 1 tablet (0.4 mg total) under the tongue every 5 (five) minutes as needed for chest  pain. 25 tablet 12  . omeprazole (PRILOSEC) 20 MG capsule Take 20 mg by mouth 2 (two) times daily.    [name] PARoxetine (PAXIL) 40 MG tablet take 1 tablet by mouth every morning 30 tablet 11  . polyethylene glycol (MIRALAX / GLYCOLAX) packet Take 17 g by mouth daily as needed. (CONSTIPATION)  0  . spironolactone (ALDACTONE) 25 MG tablet Take 0.5 tablets (12.5 mg total) by mouth daily. 45 tablet 2  . tamsulosin (FLOMAX) 0.4 MG CAPS capsule take 1 capsule by mouth daily AFTER SUPPER 30 capsule 3  . tiotropium (SPIRIVA) 18 MCG inhalation capsule Place 1 capsule (18 mcg total) into inhaler and inhale daily. 30 capsule 3  . varenicline (CHANTIX  CONTINUING MONTH PAK) 1 MG tablet Take 1 tablet (1 mg total) by mouth 2 (two) times daily. 60 tablet 3   No current facility-administered medications for this visit.    Allergies:   Sulfa antibiotics   Social History: Social History   Social History  . Marital Status: Married    Spouse Name: N/A  . Number of Children: N/A  . Years of Education: N/A   Occupational History  . Retired    Social History Main Topics  . Smoking status: Current Every Day Smoker -- 0.50 packs/day for 55 years    Types: Cigarettes  . Smokeless tobacco: Never Used     Comment: 12/21/2014 attended the "stop smoking" classes through [facility]; "on Chantix right now'  . Alcohol Use: 0.0 oz/week    0 Standard drinks or equivalent per week     Comment: 12/21/2014 "might have a couple glasses of wine/year"  . Drug Use: No  . Sexual Activity: Not Currently   Other Topics Concern  . Not on file   Social History Narrative    Family History: Family History  Problem Relation Age of Onset  . Heart attack Brother   . CAD Father   . CAD Mother   . Hypertension Mother   . Hypertension Father   . Hypertension Brother   . Stroke Neg Hx     Review of Systems: All other systems reviewed and are otherwise negative except as noted above.   Physical Exam: VS:  BP 98/60 mmHg   Pulse 60  Ht 5\' 11"  (1.803 m)  Wt 218 lb 3.2 oz (98.975 kg)  BMI 30.45 kg/m2  SpO2 94% , BMI Body mass index is 30.45 kg/(m^2).  GEN- The patient is elderly appearing, alert and oriented x 3 today, +tobacco smell HEENT: normocephalic, atraumatic; sclera clear, conjunctiva pink; hearing intact; oropharynx clear  Lungs- normal work of breathing, scattered rhonchi Heart- Regular rate and rhythm, no murmurs, rubs or gallops  GI- obese, non-tender, non-distended, bowel sounds present  Extremities- no clubbing, cyanosis, or edema; DP/PT/radial pulses 1+ bilaterally MS- no significant deformity or atrophy Skin- warm and dry, no rash or lesion; ICD pocket well healed, +scattered ecchymosis Psych- euthymic mood, full affect Neuro- strength and sensation are intact  ICD interrogation- reviewed in detail today,  See PACEART report  EKG:  EKG is not  ordered today.  Recent Labs: 09/27/2014: TSH 1.26 12/06/2014: ALT 34; Magnesium 1.8 12/12/2014: BUN 14; Creatinine, Ser 1.26; Hemoglobin 14.4; Platelets 203.0; Potassium 4.4; Sodium 136   Wt Readings from Last 3 Encounters:  01/23/15 218 lb 3.2 oz (98.975 kg)  12/22/14 217 lb 4.8 oz (98.567 kg)  12/13/14 217 lb 6.4 oz (98.612 kg)     Other studies Reviewed: Additional studies/ records that were reviewed today include:Dr Langston office notes, hospital records  Assessment and Plan:  1.  Ventricular tachycardia No recurrence since ablation 11/2014 Decrease Amiodarone to 200mg  daily (recent TFT's, LFT's normal, eye exam recommended, PFT's done by Dr Pennie Banter per patient) No driving x6 months from last VT episode - pt aware  2.  Chronic systolic dysfunction euvolemic today Stable on an appropriate medical regimen Normal ICD function See Pace Art report  3.  CAD/ICM No recent ischemic symptoms Continue medical therapy  4.  HTN Stable No change required today  5.  Tobacco abuse Cessation advised  He is trying Chantix   6.  Sinus  bradycardia Rate response turned on today    Current medicines are reviewed at  length with the patient today.   The patient does not have concerns regarding his medicines.  The following changes were made today:  Decrease amiodarone to 200mg  daily  Labs/ tests ordered today include: none   Disposition:   Follow up with Dr Lovena Le 3 months   Signed, Chanetta Marshall, NP 01/23/2015 6:53 PM  [place] [address] [phone] (office) [phone] (fax)

## 2015-02-05 ENCOUNTER — Encounter: Payer: Self-pay | Admitting: Internal Medicine

## 2015-02-05 ENCOUNTER — Ambulatory Visit (INDEPENDENT_AMBULATORY_CARE_PROVIDER_SITE_OTHER): Payer: Medicare Other | Admitting: Internal Medicine

## 2015-02-05 VITALS — BP 120/70 | HR 59 | Temp 98.4°F | Resp 12 | Ht 71.0 in | Wt 221.0 lb

## 2015-02-05 DIAGNOSIS — R1011 Right upper quadrant pain: Secondary | ICD-10-CM

## 2015-02-05 DIAGNOSIS — I255 Ischemic cardiomyopathy: Secondary | ICD-10-CM

## 2015-02-05 NOTE — Patient Instructions (Signed)
We do not need any blood work today and will not change your medicines.   Keep working on exercise to keep the body healthyExercising to Stay Healthy Exercising regularly is important. It has many health benefits, such as:  Improving your overall fitness, flexibility, and endurance.  Increasing your bone density.  Helping with weight control.  Decreasing your body fat.  Increasing your muscle strength.  Reducing stress and tension.  Improving your overall health. In order to become healthy and stay healthy, it is recommended that you do moderate-intensity and vigorous-intensity exercise. You can tell that you are exercising at a moderate intensity if you have a higher heart rate and faster breathing, but you are still able to hold a conversation. You can tell that you are exercising at a vigorous intensity if you are breathing much harder and faster and cannot hold a conversation while exercising. HOW OFTEN SHOULD I EXERCISE? Choose an activity that you enjoy and set realistic goals. Your health care provider can help you to make an activity plan that works for you. Exercise regularly as directed by your health care provider. This may include:   Doing resistance training twice each week, such as:  Push-ups.  Sit-ups.  Lifting weights.  Using resistance bands.  Doing a given intensity of exercise for a given amount of time. Choose from these options:  150 minutes of moderate-intensity exercise every week.  75 minutes of vigorous-intensity exercise every week.  A mix of moderate-intensity and vigorous-intensity exercise every week. Children, pregnant women, people who are out of shape, people who are overweight, and older adults may need to consult a health care provider for individual recommendations. If you have any sort of medical condition, be sure to consult your health care provider before starting a new exercise program.  WHAT ARE SOME EXERCISE IDEAS? Some  moderate-intensity exercise ideas include:   Walking at a rate of 1 mile in 15 minutes.  Biking.  Hiking.  Golfing.  Dancing. Some vigorous-intensity exercise ideas include:   Walking at a rate of at least 4.5 miles per hour.  Jogging or running at a rate of 5 miles per hour.  Biking at a rate of at least 10 miles per hour.  Lap swimming.  Roller-skating or in-line skating.  Cross-country skiing.  Vigorous competitive sports, such as football, basketball, and soccer.  Jumping rope.  Aerobic dancing. WHAT ARE SOME EVERYDAY ACTIVITIES THAT CAN HELP ME TO GET EXERCISE?  Yard work, such as:  Psychologist, educational.  Raking and bagging leaves.  Washing and waxing your car.  Pushing a stroller.  Shoveling snow.  Gardening.  Washing windows or floors. HOW CAN I BE MORE ACTIVE IN MY DAY-TO-DAY ACTIVITIES?  Use the stairs instead of the elevator.  Take a walk during your lunch break.  If you drive, park your car farther away from work or school.  If you take public transportation, get off one stop early and walk the rest of the way.  Make all of your phone calls while standing up and walking around.  Get up, stretch, and walk around every 30 minutes throughout the day. WHAT GUIDELINES SHOULD I FOLLOW WHILE EXERCISING?  Do not exercise so much that you hurt yourself, feel dizzy, or get very short of breath.  Consult your health care provider before starting a new exercise program.  Wear comfortable clothes and shoes with good support.  Drink plenty of water while you exercise to prevent dehydration or heat stroke. Body water  is lost during exercise and must be replaced.  Work out until you breathe faster and your heart beats faster.   This information is not intended to replace advice given to you by your health care provider. Make sure you discuss any questions you have with your health care provider.   Document Released: 04/18/2010 Document Revised:  04/06/2014 Document Reviewed: 08/17/2013 Elsevier Interactive Patient Education [employer].

## 2015-02-05 NOTE — Progress Notes (Signed)
Pre visit review using our clinic review tool, if applicable. No additional management support is needed unless otherwise documented below in the visit note. 

## 2015-02-05 NOTE — Progress Notes (Signed)
   Subjective:    Patient ID: Steven Guzman, male    DOB: [birth year], 75 y.o.   MRN: [id number]  HPI The patient is a 74 YO man coming in with RUQ pain. He gets mild pain that last 1-2 seconds and maybe 5-10 per week. No pattern to the pains. Not affected by food. No diarrhea, constipation. No nausea or vomiting.   Review of Systems  Constitutional: Positive for activity change. Negative for fever, appetite change and unexpected weight change.  HENT: Negative.   Eyes: Negative.   Respiratory: Positive for shortness of breath. Negative for cough, chest tightness and wheezing.        Improved  Cardiovascular: Negative for chest pain, palpitations and leg swelling.  Gastrointestinal: Positive for abdominal pain. Negative for diarrhea, constipation and abdominal distention.  Musculoskeletal: Positive for arthralgias.  Skin: Negative.   Neurological: Negative for dizziness, tremors, light-headedness, numbness and headaches.  Psychiatric/Behavioral: Negative.       Objective:   Physical Exam  Constitutional: He is oriented to person, place, and time. He appears well-developed and well-nourished.  HENT:  Head: Normocephalic and atraumatic.  Eyes: EOM are normal.  Neck: Normal range of motion.  Cardiovascular: Normal rate.   Pulmonary/Chest: Effort normal and breath sounds normal.  Abdominal: Soft. Bowel sounds are normal. He exhibits no distension. There is no tenderness. There is no rebound.  Musculoskeletal: He exhibits no edema.  Neurological: He is alert and oriented to person, place, and time. Coordination normal.  Skin: Skin is warm and dry.  Psychiatric: He has a normal mood and affect. His behavior is normal.   Filed Vitals:   02/05/15 1304  BP: 120/70  Pulse: 59  Temp: 98.4 F (36.9 C)  TempSrc: Oral  Resp: 12  Height: 5\' 11"  (1.803 m)  Weight: 221 lb (100.245 kg)  SpO2: 96%      Assessment & Plan:

## 2015-02-05 NOTE — Assessment & Plan Note (Signed)
Possibly related to biliary spasm but not typical. Time is very short and no treatment warranted at this time. Will monitor for now, previously LFTs normalized and kidney function stable. Will not repeat today given lack of new symptoms.

## 2015-02-06 ENCOUNTER — Other Ambulatory Visit: Payer: Self-pay | Admitting: Internal Medicine

## 2015-02-28 ENCOUNTER — Other Ambulatory Visit: Payer: Self-pay

## 2015-02-28 MED ORDER — CHOLINE FENOFIBRATE 135 MG PO CPDR: 135.0000 mg | DELAYED_RELEASE_CAPSULE | Freq: Every day | ORAL | Status: DC

## 2015-03-07 ENCOUNTER — Ambulatory Visit: Payer: Medicare Other | Admitting: Internal Medicine

## 2015-03-13 ENCOUNTER — Other Ambulatory Visit: Payer: Self-pay | Admitting: Internal Medicine

## 2015-03-15 ENCOUNTER — Ambulatory Visit (INDEPENDENT_AMBULATORY_CARE_PROVIDER_SITE_OTHER): Payer: Medicare Other | Admitting: Internal Medicine

## 2015-03-15 ENCOUNTER — Encounter: Payer: Self-pay | Admitting: Internal Medicine

## 2015-03-15 VITALS — BP 96/68 | HR 60 | Ht 71.0 in | Wt 217.0 lb

## 2015-03-15 DIAGNOSIS — I5022 Chronic systolic (congestive) heart failure: Secondary | ICD-10-CM | POA: Diagnosis not present

## 2015-03-15 DIAGNOSIS — I472 Ventricular tachycardia, unspecified: Secondary | ICD-10-CM

## 2015-03-15 DIAGNOSIS — I255 Ischemic cardiomyopathy: Secondary | ICD-10-CM | POA: Diagnosis not present

## 2015-03-15 DIAGNOSIS — I480 Paroxysmal atrial fibrillation: Secondary | ICD-10-CM | POA: Diagnosis not present

## 2015-03-15 DIAGNOSIS — Z95810 Presence of automatic (implantable) cardiac defibrillator: Secondary | ICD-10-CM

## 2015-03-15 DIAGNOSIS — I4891 Unspecified atrial fibrillation: Secondary | ICD-10-CM | POA: Insufficient documentation

## 2015-03-15 LAB — CUP PACEART INCLINIC DEVICE CHECK
Date Time Interrogation Session: 20161216050000
HIGH POWER IMPEDANCE MEASURED VALUE: 43 Ohm
HIGH POWER IMPEDANCE MEASURED VALUE: 56 Ohm
Implantable Lead Implant Date: 20110610
Implantable Lead Location: 753859
Implantable Lead Model: 4135
Implantable Lead Serial Number: 28681386
Lead Channel Impedance Value: 486 Ohm
Lead Channel Pacing Threshold Amplitude: 0.9 V
Lead Channel Pacing Threshold Pulse Width: 0.4 ms
Lead Channel Sensing Intrinsic Amplitude: 12 mV
Lead Channel Sensing Intrinsic Amplitude: 2.9 mV
Lead Channel Setting Pacing Pulse Width: 0.4 ms
Lead Channel Setting Sensing Sensitivity: 0.6 mV
MDC IDC LEAD IMPLANT DT: 20110610
MDC IDC LEAD LOCATION: 753860
MDC IDC LEAD MODEL: 185
MDC IDC LEAD SERIAL: 339643
MDC IDC MSMT LEADCHNL RA IMPEDANCE VALUE: 655 Ohm
MDC IDC MSMT LEADCHNL RV PACING THRESHOLD AMPLITUDE: 0.8 V
MDC IDC MSMT LEADCHNL RV PACING THRESHOLD PULSEWIDTH: 0.4 ms
MDC IDC SET LEADCHNL RA PACING AMPLITUDE: 2 V
MDC IDC SET LEADCHNL RV PACING AMPLITUDE: 2 V
Pulse Gen Serial Number: 164892

## 2015-03-15 MED ORDER — RIVAROXABAN 20 MG PO TABS: 20.0000 mg | ORAL_TABLET | Freq: Every day | ORAL | Status: DC

## 2015-03-15 MED ORDER — NITROGLYCERIN 0.4 MG SL SUBL: 0.4000 mg | SUBLINGUAL_TABLET | SUBLINGUAL | Status: DC | PRN

## 2015-03-15 NOTE — Progress Notes (Signed)
HPI Steven Guzman returns today for ongoing evaluation and management of his ICD and ventricular tachycardia. He is a very pleasant 74 year old man with known coronary disease, status post MI, who developed ventricular tachycardia and underwent ICD implantation several years ago. Because of recurrent ventricular tachycardia, he was placed on amiodarone therapy. He has had recurrent VT despite fairly high dose amiodarone.  He has been hospitalized with recurrent VT. He has also had VT after ERCP. In the interim, he has been stable. He does note occaisional palpitations. He has not had syncope. Allergies  Allergen Reactions  . Sulfa Antibiotics Hives     Current Outpatient Prescriptions  Medication Sig Dispense Refill  . amiodarone (PACERONE) 200 MG tablet Take 1 tablet (200 mg total) by mouth daily. Take one tablet by mouth in the morning 60 tablet 2  . aspirin 81 MG tablet Take 81 mg by mouth daily.    [name] atorvastatin (LIPITOR) 80 MG tablet Take 1 tablet (80 mg total) by mouth daily. 30 tablet 6  . benazepril (LOTENSIN) 10 MG tablet Take 1 tablet (10 mg total) by mouth daily. 90 tablet 3  . busPIRone (BUSPAR) 15 MG tablet Take 7.5 mg by mouth 2 (two) times daily. Take one half tablet by mouth in the morning and one half tablet in the evening.    . carvedilol (COREG) 12.5 MG tablet Take 1 tablet (12.5 mg total) by mouth 2 (two) times daily with a meal. 180 tablet 3  . cetirizine (ZYRTEC) 10 MG tablet Take 10 mg by mouth daily as needed for allergies.    . Choline Fenofibrate (TRILIPIX) 135 MG capsule Take 1 capsule (135 mg total) by mouth daily. 30 capsule 6  . furosemide (LASIX) 40 MG tablet Take 0.5 tablets (20 mg total) by mouth daily. 30 tablet 3  . gabapentin (NEURONTIN) 100 MG capsule take 3 capsules by mouth twice a day 540 capsule 2  . Ipratropium-Albuterol (COMBIVENT RESPIMAT) 20-100 MCG/ACT AERS respimat Inhale 1 puff into the lungs every 6 (six) hours. Shortness of breath or  wheezing    . nitroGLYCERIN (NITROSTAT) 0.4 MG SL tablet Place 1 tablet (0.4 mg total) under the tongue every 5 (five) minutes as needed for chest pain. 25 tablet 12  . omeprazole (PRILOSEC) 20 MG capsule Take 20 mg by mouth 2 (two) times daily.    [name] PARoxetine (PAXIL) 40 MG tablet take 1 tablet by mouth every morning 30 tablet 11  . polyethylene glycol (MIRALAX / GLYCOLAX) packet Take 17 g by mouth daily as needed. (CONSTIPATION)  0  . rivaroxaban (XARELTO) 20 MG TABS tablet Take 20 mg by mouth daily with supper.    [name] spironolactone (ALDACTONE) 25 MG tablet Take 0.5 tablets (12.5 mg total) by mouth daily. 45 tablet 2  . SYMBICORT 160-4.5 MCG/ACT inhaler inhale 2 puffs by mouth every morning and 2 puffs every evening ; SEPARATE DOSES BY 12 HOURS 10.2 Inhaler 3  . tamsulosin (FLOMAX) 0.4 MG CAPS capsule take 1 capsule by mouth once daily AFTER SUPPER 30 capsule 3  . tiotropium (SPIRIVA) 18 MCG inhalation capsule Place 1 capsule (18 mcg total) into inhaler and inhale daily. 30 capsule 3  . varenicline (CHANTIX CONTINUING MONTH PAK) 1 MG tablet Take 1 tablet (1 mg total) by mouth 2 (two) times daily. 60 tablet 3   No current facility-administered medications for this visit.     Past Medical History  Diagnosis Date  . Hypertension   . Hyperlipidemia   .  GERD (gastroesophageal reflux disease)   . Diverticulosis of colon 07/2014    noted on CT  . CAD (coronary artery disease)     a. s/p MI in 1994 with PCI to LAD at that time b. cath 10/2012 demonstrated EF 30%, inferior akinesis with mild hypokinesis of all walls, patent LAD and RCA stents; ostial PDA with 80-90% obstruction with medical therapy recommended   . Depression   . Aneurysm ([place])     abdominal <4. 3.6 cm on ultrasound 09/2014.   . Tobacco abuse   . Ventricular tachycardia ([place])     a. 08/2009 s/p BSX E110 Teligen 100 AICD, ser#: [id number];  b. 08/2008 VT req ATP - detection reprogrammed from 160 to 150. c. EPS and VT ablation by Dr.  Lovena Le 12/21/2014  . Chronic systolic CHF (congestive heart failure) (HCC)     EF 30 to 35 % as of 09/2014.   [name] Hiatal hernia   . Prostate enlargement 07/2014    observed on CT  . Hyperglycemia 10/2012.  [name] AICD (automatic cardioverter/defibrillator) present   . Basal cell carcinoma of nose     S/P MOHS  . Complication of anesthesia 10/2014    "had to have defibrillator w/ERCP"  . Anginal pain ([place])   . Myocardial infarction [facility]) 1994; 2011  . COPD (chronic obstructive pulmonary disease) ([place])     a. followed by pulmonary, COPD GOLD stage II  . Pneumonia 1946; 2015  . Chronic bronchitis ([place])     "was having it q yr; haven't had it in a couple years" (12/21/2014)  . Anxiety     ROS:   All systems reviewed and negative except as noted in the HPI.   Past Surgical History  Procedure Laterality Date  . Foot surgery Left 2005    "fixed bone that stuck out in my ankle area"  . Hemorrhoid banding    . Retinal detachment surgery Right 2013  . Mohs surgery  2008    nose, skin graft  . Colonoscopy    . Tenolysis Right 12/21/2013    Procedure: TENOLYSIS FLEXOR CARPI RADIALIS ,DEBRIDEMENT RIGHT JOINT WRIST,DEBRIDEMENT SCAPHOTRAPEZIAL TRAPEZOID, REPAIR OF EXTENSOR HOOD;  Surgeon: Daryll Brod, MD;  Location: [place];  Service: Orthopedics;  Laterality: Right;  . Left heart catheterization with coronary angiogram N/A 11/25/2012    demonstrated EF 30%, inferior akinesis with mild hypokinesis of all walls, patent LAD and RCA stents; ostial PDA with 80-90% obstruction with medical therapy recommended  . Implantable cardioverter defibrillator implant  09/06/09    BSX dual chamber ICD implanted in [place] for cardiac arrest 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  . Ercp N/A 11/16/2014    Procedure: ENDOSCOPIC RETROGRADE CHOLANGIOPANCREATOGRAPHY (ERCP);  Surgeon: [name], MD;  Location: [place];  Service: Endoscopy;  Laterality: N/A;  . V-tach ablation  12/21/2014  . Inguinal hernia  repair Right ~ 1995  . Eye surgery    . Cataract extraction w/ intraocular lens  implant, bilateral Bilateral ~ 2011  . Electrophysiologic study N/A 12/21/2014    Procedure: V Tach Ablation;  Surgeon: [name], MD;  Location: [facility];  Service: Cardiovascular;  Laterality: N/A;     Family History  Problem Relation Age of Onset  . Heart attack Brother   . CAD Father   . CAD Mother   . Hypertension Mother   . Hypertension Father   . Hypertension Brother   . Stroke Neg Hx  Social History   Social History  . Marital Status: Married    Spouse Name: N/A  . Number of Children: N/A  . Years of Education: N/A   Occupational History  . Retired    Social History Main Topics  . Smoking status: Current Every Day Smoker -- 0.50 packs/day for 55 years    Types: Cigarettes  . Smokeless tobacco: Never Used     Comment: 12/21/2014 attended the "stop smoking" classes through [facility]; "on Chantix right now'  . Alcohol Use: 0.0 oz/week    0 Standard drinks or equivalent per week     Comment: 12/21/2014 "might have a couple glasses of wine/year"  . Drug Use: No  . Sexual Activity: Not Currently   Other Topics Concern  . Not on file   Social History Narrative     BP 96/68 mmHg  Pulse 60  Ht 5\' 11"  (1.803 m)  Wt 217 lb (98.431 kg)  BMI 30.28 kg/m2  Physical Exam:  Well appearing 74 year old man, NAD HEENT: Unremarkable Neck:  7 cm JVD, no thyromegally Back:  No CVA tenderness Lungs:  Clear with no wheezes, rales, or rhonchi. HEART:  Regular rate rhythm, no murmurs, no rubs, no clicks Abd:  soft, positive bowel sounds, no organomegally, no rebound, no guarding Ext:  2 plus pulses, no edema, no cyanosis, no clubbing Skin:  No rashes no nodules Neuro:  CN II through XII intact, motor grossly intact  DEVICE  Normal device function.  See PaceArt for details.   Assess/Plan:

## 2015-03-15 NOTE — Patient Instructions (Addendum)
Medication Instructions:  Your physician has recommended you make the following change in your medication:  1) Start Xarelto 20 mg once daily 2) STOP Aspirin    Labwork: Your physician recommends that you return for lab work in: 2 weeks for BMP   Testing/Procedures: None ordered   Follow-Up: Your physician recommends that you schedule a follow-up appointment in: 3 months in the device clinic and 12 months with Dr Lovena Le   Any Other Special Instructions Will Be Listed Below (If Applicable).     If you need a refill on your cardiac medications before your next appointment, please call your pharmacy.

## 2015-03-15 NOTE — Assessment & Plan Note (Signed)
His symptoms are class 2. He will continue his current meds.  

## 2015-03-15 NOTE — Assessment & Plan Note (Signed)
This is a new problem. I have asked the patient to start anti-coagulation with Xarelto as he desires a once a day treatment and does not want to take warfarin as he likes to eat green vegetables.

## 2015-03-15 NOTE — Assessment & Plan Note (Signed)
He has had no VT. I have recommended he continue low dose amiodarone.

## 2015-03-26 DIAGNOSIS — H5211 Myopia, right eye: Secondary | ICD-10-CM | POA: Diagnosis not present

## 2015-03-28 ENCOUNTER — Telehealth: Payer: Self-pay | Admitting: Pulmonary Disease

## 2015-03-28 DIAGNOSIS — R0602 Shortness of breath: Secondary | ICD-10-CM | POA: Diagnosis not present

## 2015-03-28 MED ORDER — AZITHROMYCIN 250 MG PO TABS: ORAL_TABLET | ORAL | Status: DC

## 2015-03-28 NOTE — Telephone Encounter (Signed)
Called and spoke with pt. Reviewed results and recs and verified pharmacy as [facility] on [address] in [facility] Pt voiced understanding and had no further questions. Rx sent, nothing further needed.

## 2015-03-28 NOTE — Telephone Encounter (Signed)
Offer Zpak 

## 2015-03-28 NOTE — Telephone Encounter (Signed)
Called and spoke with pt. Pt c/o sore throat, chest congestion, prod cough with clear mucus, slight wheezing, sinus drainage and congestion during the mornings starting on 03/25/15. Denies any SOB, fever, nausea or vomiting. Pt is requesting a antibiotic be sent to [facility] on [address] in [place]. I explained to the patient that we had no availability for an ov and that I would send the message to CY. He voiced understanding and had no further questions.   CY please advise   Allergies  Allergen Reactions  . Sulfa Antibiotics Hives     Current Outpatient Prescriptions on File Prior to Visit  Medication Sig Dispense Refill  . amiodarone (PACERONE) 200 MG tablet Take 1 tablet (200 mg total) by mouth daily. Take one tablet by mouth in the morning 60 tablet 2  . atorvastatin (LIPITOR) 80 MG tablet Take 1 tablet (80 mg total) by mouth daily. 30 tablet 6  . benazepril (LOTENSIN) 10 MG tablet Take 1 tablet (10 mg total) by mouth daily. 90 tablet 3  . busPIRone (BUSPAR) 15 MG tablet Take 7.5 mg by mouth 2 (two) times daily. Take one half tablet by mouth in the morning and one half tablet in the evening.    . carvedilol (COREG) 12.5 MG tablet Take 1 tablet (12.5 mg total) by mouth 2 (two) times daily with a meal. 180 tablet 3  . cetirizine (ZYRTEC) 10 MG tablet Take 10 mg by mouth daily as needed for allergies.    . Choline Fenofibrate (TRILIPIX) 135 MG capsule Take 1 capsule (135 mg total) by mouth daily. 30 capsule 6  . furosemide (LASIX) 40 MG tablet Take 0.5 tablets (20 mg total) by mouth daily. 30 tablet 3  . gabapentin (NEURONTIN) 100 MG capsule take 3 capsules by mouth twice a day 540 capsule 2  . Ipratropium-Albuterol (COMBIVENT RESPIMAT) 20-100 MCG/ACT AERS respimat Inhale 1 puff into the lungs every 6 (six) hours. Shortness of breath or wheezing    . nitroGLYCERIN (NITROSTAT) 0.4 MG SL tablet Place 1 tablet (0.4 mg total) under the tongue every 5 (five) minutes x 3 doses as needed for  chest pain. 25 tablet 11  . omeprazole (PRILOSEC) 20 MG capsule Take 20 mg by mouth 2 (two) times daily.    [name] PARoxetine (PAXIL) 40 MG tablet take 1 tablet by mouth every morning 30 tablet 11  . polyethylene glycol (MIRALAX / GLYCOLAX) packet Take 17 g by mouth daily as needed. (CONSTIPATION)  0  . rivaroxaban (XARELTO) 20 MG TABS tablet Take 1 tablet (20 mg total) by mouth daily with supper. 30 tablet 11  . spironolactone (ALDACTONE) 25 MG tablet Take 0.5 tablets (12.5 mg total) by mouth daily. 45 tablet 2  . SYMBICORT 160-4.5 MCG/ACT inhaler inhale 2 puffs by mouth every morning and 2 puffs every evening ; SEPARATE DOSES BY 12 HOURS 10.2 Inhaler 3  . tamsulosin (FLOMAX) 0.4 MG CAPS capsule take 1 capsule by mouth once daily AFTER SUPPER 30 capsule 3  . tiotropium (SPIRIVA) 18 MCG inhalation capsule Place 1 capsule (18 mcg total) into inhaler and inhale daily. 30 capsule 3  . varenicline (CHANTIX CONTINUING MONTH PAK) 1 MG tablet Take 1 tablet (1 mg total) by mouth 2 (two) times daily. 60 tablet 3   No current facility-administered medications on file prior to visit.

## 2015-03-29 ENCOUNTER — Other Ambulatory Visit (INDEPENDENT_AMBULATORY_CARE_PROVIDER_SITE_OTHER): Payer: Medicare Other

## 2015-03-29 DIAGNOSIS — I5022 Chronic systolic (congestive) heart failure: Secondary | ICD-10-CM | POA: Diagnosis not present

## 2015-04-03 ENCOUNTER — Other Ambulatory Visit (INDEPENDENT_AMBULATORY_CARE_PROVIDER_SITE_OTHER): Payer: Medicare Other | Admitting: *Deleted

## 2015-04-03 DIAGNOSIS — I10 Essential (primary) hypertension: Secondary | ICD-10-CM | POA: Diagnosis not present

## 2015-04-03 LAB — BASIC METABOLIC PANEL
BUN: 13 mg/dL (ref 7–25)
CALCIUM: 9.4 mg/dL (ref 8.6–10.3)
CHLORIDE: 103 mmol/L (ref 98–110)
CO2: 24 mmol/L (ref 20–31)
Creat: 1.12 mg/dL (ref 0.70–1.18)
GLUCOSE: 72 mg/dL (ref 65–99)
POTASSIUM: 4.2 mmol/L (ref 3.5–5.3)
Sodium: 137 mmol/L (ref 135–146)

## 2015-04-03 NOTE — Addendum Note (Signed)
Addended by: Eulis Foster on: 04/03/2015 03:09 PM   Modules accepted: Orders

## 2015-04-16 ENCOUNTER — Telehealth: Payer: Self-pay | Admitting: Pulmonary Disease

## 2015-04-16 MED ORDER — TIOTROPIUM BROMIDE MONOHYDRATE 18 MCG IN CAPS: 18.0000 ug | ORAL_CAPSULE | Freq: Every day | RESPIRATORY_TRACT | Status: DC

## 2015-04-16 NOTE — Telephone Encounter (Signed)
Called spoke with pt. He needed refill on spiriva sent in. I have done so. Nothing further needed

## 2015-04-17 ENCOUNTER — Other Ambulatory Visit: Payer: Self-pay | Admitting: Pulmonary Disease

## 2015-04-17 MED ORDER — TIOTROPIUM BROMIDE MONOHYDRATE 18 MCG IN CAPS: 18.0000 ug | ORAL_CAPSULE | Freq: Every day | RESPIRATORY_TRACT | Status: DC

## 2015-04-25 ENCOUNTER — Other Ambulatory Visit: Payer: Self-pay | Admitting: Internal Medicine

## 2015-04-25 NOTE — Telephone Encounter (Signed)
Pt request re-fill.  Please advise.  Thanks!

## 2015-05-07 ENCOUNTER — Encounter: Payer: Self-pay | Admitting: Pulmonary Disease

## 2015-05-07 ENCOUNTER — Ambulatory Visit (INDEPENDENT_AMBULATORY_CARE_PROVIDER_SITE_OTHER): Payer: Medicare Other | Admitting: Pulmonary Disease

## 2015-05-07 VITALS — BP 118/70 | HR 60 | Ht 71.0 in | Wt 213.0 lb

## 2015-05-07 DIAGNOSIS — J449 Chronic obstructive pulmonary disease, unspecified: Secondary | ICD-10-CM

## 2015-05-07 DIAGNOSIS — F17210 Nicotine dependence, cigarettes, uncomplicated: Secondary | ICD-10-CM

## 2015-05-07 DIAGNOSIS — Z720 Tobacco use: Secondary | ICD-10-CM | POA: Diagnosis not present

## 2015-05-07 NOTE — Progress Notes (Signed)
Subjective:    Patient ID: Steven Guzman, male    DOB: 04/03/1940, 75 y.o.   MRN: [id number]  Synopsis: GOLD Grade C COPD, still actively smoking as of November 2015 10/2013 PFT> ratio 61%, FEV1 2.53L (77% pred, 15% change), TLC 8.65 L (117% pred), RV 4.25L (161% pred), DLCO 25.39 (73% pred)  HPI Chief Complaint  Patient presents with  . Follow-up    pt doing well today, no CAT score 12.   Steven Guzman has been OK.  He has still been smoking lately.  He has not been very active.  He says that he doesn't exercise much.   He is still taking both inhalers regularly and feels that they really help.   He typically takes Spiriva right before bedtime.  Last night he forgot ad he could tell a difference this morning.  He really wants to quit smoking and wants to try Chantix.   Past Medical History  Diagnosis Date  . Hypertension   . Hyperlipidemia   . GERD (gastroesophageal reflux disease)   . Diverticulosis of colon 07/2014    noted on CT  . CAD (coronary artery disease)     a. s/p MI in 1994 with PCI to LAD at that time b. cath 10/2012 demonstrated EF 30%, inferior akinesis with mild hypokinesis of all walls, patent LAD and RCA stents; ostial PDA with 80-90% obstruction with medical therapy recommended   . Depression   . Aneurysm ([place])     abdominal <4. 3.6 cm on ultrasound 09/2014.   . Tobacco abuse   . Ventricular tachycardia ([place])     a. 08/2009 s/p BSX E110 Teligen 100 AICD, ser#: [id number];  b. 08/2008 VT req ATP - detection reprogrammed from 160 to 150. c. EPS and VT ablation by Dr. Lovena Le 12/21/2014  . Chronic systolic CHF (congestive heart failure) (H[facility])     EF 30 to 35 % as of 09/2014.   Steven Guzman Hiatal hernia   . Prostate enlargement 07/2014    observed on CT  . Hyperglycemia 10/2012.  Steven Guzman AICD (automatic cardioverter/defibrillator) present   . Basal cell carcinoma of nose     S/P MOHS  . Complication of anesthesia 10/2014    "had to have defibrillator w/ERCP"  . Anginal pain ([place])   .  Myocardial infarction [facility]) 1994; 2011  . COPD (chronic obstructive pulmonary disease) ([name])     a. followed by pulmonary, COPD GOLD stage II  . Pneumonia 1946; 2015  . Chronic bronchitis ([place])     "was having it q yr; haven't had it in a couple years" (12/21/2014)  . Anxiety      Review of Systems  Constitutional: Negative for fever, chills and fatigue.  HENT: Negative for postnasal drip, rhinorrhea and sinus pressure.   Respiratory: Negative for cough, shortness of breath and wheezing.   Cardiovascular: Negative for chest pain, palpitations and leg swelling.       Objective:   Physical Exam Filed Vitals:   05/07/15 1623  BP: 118/70  Pulse: 60  Height: 5\' 11"  (1.803 m)  Weight: 213 lb (96.616 kg)  SpO2: 100%   RA  Gen: well appearing, no acute distress HEENT: NCAT, EOMi, OP clear PULM: Clear to auscultation bilaterally today with good air movement CV: RRR, no mgr, no JVD AB: BS+, soft, nontender Ext: warm, no edema, no clubbing, no cyanosis Derm: no rash or skin breakdown Neuro: A&Ox4, MAEW      Assessment & Plan:   Cigarette smoker He  continues to smoke. We talked about this at length today. He did not have ill effects from the Chantix but he continued smoking while using it. He has not been successful with smoking cessation classes and nicotine replacement has not been helpful either.  I told him today that is really up to him to try to quit smoking but I am happy to try using medical therapy again. He was given information regarding free nicotine replacement from the state of [place].  I will make sure that he has a lung cancer screening CT lined up for this year.  COPD GOLD GRADE C This has been a stable interval for him. He has moderate airflow obstruction but significant symptoms in that he has severe exacerbations about once per year. Fortunately this has not occurred recently.  He was counseled to quit smoking at length the day.  Plan: Continue  Symbicort and Spiriva Follow-up 6 months    Updated Medication List Outpatient Encounter Prescriptions as of 05/07/2015  Medication Sig  . amiodarone (PACERONE) 200 MG tablet Take 1 tablet (200 mg total) by mouth daily. Take one tablet by mouth in the morning  . atorvastatin (LIPITOR) 80 MG tablet Take 1 tablet (80 mg total) by mouth daily.  . benazepril (LOTENSIN) 10 MG tablet Take 1 tablet (10 mg total) by mouth daily.  . busPIRone (BUSPAR) 15 MG tablet take 1/2 tablet by mouth twice a day  . carvedilol (COREG) 12.5 MG tablet Take 1 tablet (12.5 mg total) by mouth 2 (two) times daily with a meal.  . cetirizine (ZYRTEC) 10 MG tablet Take 10 mg by mouth daily as needed for allergies.  . Choline Fenofibrate (TRILIPIX) 135 MG capsule Take 1 capsule (135 mg total) by mouth daily.  . furosemide (LASIX) 40 MG tablet Take 0.5 tablets (20 mg total) by mouth daily.  Steven Guzman gabapentin (NEURONTIN) 100 MG capsule take 3 capsules by mouth twice a day  . Ipratropium-Albuterol (COMBIVENT RESPIMAT) 20-100 MCG/ACT AERS respimat Inhale 1 puff into the lungs every 6 (six) hours. Shortness of breath or wheezing  . nitroGLYCERIN (NITROSTAT) 0.4 MG SL tablet Place 1 tablet (0.4 mg total) under the tongue every 5 (five) minutes x 3 doses as needed for chest pain.  Steven Guzman omeprazole (PRILOSEC) 20 MG capsule Take 20 mg by mouth 2 (two) times daily.  Steven Guzman PARoxetine (PAXIL) 40 MG tablet take 1 tablet by mouth every morning  . rivaroxaban (XARELTO) 20 MG TABS tablet Take 1 tablet (20 mg total) by mouth daily with supper.  Steven Guzman spironolactone (ALDACTONE) 25 MG tablet Take 0.5 tablets (12.5 mg total) by mouth daily.  . SYMBICORT 160-4.5 MCG/ACT inhaler inhale 2 puffs by mouth every morning and 2 puffs every evening ; SEPARATE DOSES BY 12 HOURS  . tamsulosin (FLOMAX) 0.4 MG CAPS capsule take 1 capsule by mouth once daily AFTER SUPPER  . tiotropium (SPIRIVA) 18 MCG inhalation capsule Place 1 capsule (18 mcg total) into inhaler and inhale  daily.  . [DISCONTINUED] azithromycin (ZITHROMAX) 250 MG tablet Take 2 tabs today and then 1 tab daily until gone (Patient not taking: Reported on 05/07/2015)  . [DISCONTINUED] polyethylene glycol (MIRALAX / GLYCOLAX) packet Take 17 g by mouth daily as needed. Reported on 05/07/2015  . [DISCONTINUED] varenicline (CHANTIX CONTINUING MONTH PAK) 1 MG tablet Take 1 tablet (1 mg total) by mouth 2 (two) times daily. (Patient not taking: Reported on 05/07/2015)   No facility-administered encounter medications on file as of 05/07/2015.

## 2015-05-07 NOTE — Assessment & Plan Note (Signed)
This has been a stable interval for him. He has moderate airflow obstruction but significant symptoms in that he has severe exacerbations about once per year. Fortunately this has not occurred recently.  He was counseled to quit smoking at length the day.  Plan: Continue Symbicort and Spiriva Follow-up 6 months

## 2015-05-07 NOTE — Patient Instructions (Signed)
Keep taking your medications as you are doing We will see you after the CT scan in July Do your best to try to quit smoking Call 1-800-quit-now to get free nicotine replacement from the state of [place]

## 2015-05-07 NOTE — Assessment & Plan Note (Addendum)
He continues to smoke. We talked about this at length today. He did not have ill effects from the Chantix but he continued smoking while using it. He has not been successful with smoking cessation classes and nicotine replacement has not been helpful either.  I told him today that is really up to him to try to quit smoking but I am happy to try using medical therapy again. He was given information regarding free nicotine replacement from the state of [place].  I will make sure that he has a lung cancer screening CT lined up for this year.

## 2015-05-13 ENCOUNTER — Encounter: Payer: Self-pay | Admitting: Interventional Cardiology

## 2015-05-24 ENCOUNTER — Other Ambulatory Visit (INDEPENDENT_AMBULATORY_CARE_PROVIDER_SITE_OTHER): Payer: Medicare Other

## 2015-05-24 ENCOUNTER — Encounter: Payer: Self-pay | Admitting: Internal Medicine

## 2015-05-24 ENCOUNTER — Ambulatory Visit: Payer: Medicare Other | Admitting: Interventional Cardiology

## 2015-05-24 ENCOUNTER — Ambulatory Visit (INDEPENDENT_AMBULATORY_CARE_PROVIDER_SITE_OTHER): Payer: Medicare Other | Admitting: Internal Medicine

## 2015-05-24 VITALS — BP 118/84 | HR 61 | Temp 98.8°F | Resp 18 | Ht 71.0 in | Wt 217.0 lb

## 2015-05-24 DIAGNOSIS — F329 Major depressive disorder, single episode, unspecified: Secondary | ICD-10-CM

## 2015-05-24 DIAGNOSIS — F32A Depression, unspecified: Secondary | ICD-10-CM

## 2015-05-24 DIAGNOSIS — R82998 Other abnormal findings in urine: Secondary | ICD-10-CM

## 2015-05-24 DIAGNOSIS — R8299 Other abnormal findings in urine: Secondary | ICD-10-CM

## 2015-05-24 LAB — URINALYSIS, ROUTINE W REFLEX MICROSCOPIC
Bilirubin Urine: NEGATIVE
HGB URINE DIPSTICK: NEGATIVE
KETONES UR: NEGATIVE
LEUKOCYTES UA: NEGATIVE
NITRITE: NEGATIVE
Specific Gravity, Urine: 1.02 (ref 1.000–1.030)
TOTAL PROTEIN, URINE-UPE24: NEGATIVE
URINE GLUCOSE: NEGATIVE
UROBILINOGEN UA: 1 (ref 0.0–1.0)
pH: 6 (ref 5.0–8.0)

## 2015-05-24 MED ORDER — ESCITALOPRAM OXALATE 10 MG PO TABS: 10.0000 mg | ORAL_TABLET | Freq: Every day | ORAL | Status: DC

## 2015-05-24 NOTE — Progress Notes (Signed)
Pre visit review using our clinic review tool, if applicable. No additional management support is needed unless otherwise documented below in the visit note. 

## 2015-05-24 NOTE — Patient Instructions (Signed)
We will check the urine today.   We will have you start taking 1/2 of the paxil daily for 5 days. Then start taking the new medicne lexapro (escitalopram) 10 mg daily and stop the paxil. Take 1 pill daily of the lexapro and after 2 weeks if you are doing well and still feeling depressed we can increase to the full dose of 20 mg (2 pills of the 10 mg pills). If you do well with this we will send in the 20 mg pills.  Think about turning off the news more and doing fun things to help you be more present in the happy things in life and not as worried all the time.  Stress and Stress Management Stress is a normal reaction to life events. It is what you feel when life demands more than you are used to or more than you can handle. Some stress can be useful. For example, the stress reaction can help you catch the last bus of the day, study for a test, or meet a deadline at work. But stress that occurs too often or for too long can cause problems. It can affect your emotional health and interfere with relationships and normal daily activities. Too much stress can weaken your immune system and increase your risk for physical illness. If you already have a medical problem, stress can make it worse. CAUSES  All sorts of life events may cause stress. An event that causes stress for one person may not be stressful for another person. Major life events commonly cause stress. These may be positive or negative. Examples include losing your job, moving into a new home, getting married, having a baby, or losing a loved one. Less obvious life events may also cause stress, especially if they occur day after day or in combination. Examples include working long hours, driving in traffic, caring for children, being in debt, or being in a difficult relationship. SIGNS AND SYMPTOMS Stress may cause emotional symptoms including, the following:  Anxiety. This is feeling worried, afraid, on edge, overwhelmed, or out of  control.  Anger. This is feeling irritated or impatient.  Depression. This is feeling sad, down, helpless, or guilty.  Difficulty focusing, remembering, or making decisions. Stress may cause physical symptoms, including the following:   Aches and pains. These may affect your head, neck, back, stomach, or other areas of your body.  Tight muscles or clenched jaw.  Low energy or trouble sleeping. Stress may cause unhealthy behaviors, including the following:   Eating to feel better (overeating) or skipping meals.  Sleeping too little, too much, or both.  Working too much or putting off tasks (procrastination).  Smoking, drinking alcohol, or using drugs to feel better. DIAGNOSIS  Stress is diagnosed through an assessment by your health care provider. Your health care provider will ask questions about your symptoms and any stressful life events.Your health care provider will also ask about your medical history and may order blood tests or other tests. Certain medical conditions and medicine can cause physical symptoms similar to stress. Mental illness can cause emotional symptoms and unhealthy behaviors similar to stress. Your health care provider may refer you to a mental health professional for further evaluation.  TREATMENT  Stress management is the recommended treatment for stress.The goals of stress management are reducing stressful life events and coping with stress in healthy ways.  Techniques for reducing stressful life events include the following:  Stress identification. Self-monitor for stress and identify what causes stress for  you. These skills may help you to avoid some stressful events.  Time management. Set your priorities, keep a calendar of events, and learn to say "no." These tools can help you avoid making too many commitments. Techniques for coping with stress include the following:  Rethinking the problem. Try to think realistically about stressful events rather  than ignoring them or overreacting. Try to find the positives in a stressful situation rather than focusing on the negatives.  Exercise. Physical exercise can release both physical and emotional tension. The key is to find a form of exercise you enjoy and do it regularly.  Relaxation techniques. These relax the body and mind. Examples include yoga, meditation, tai chi, biofeedback, deep breathing, progressive muscle relaxation, listening to music, being out in nature, journaling, and other hobbies. Again, the key is to find one or more that you enjoy and can do regularly.  Healthy lifestyle. Eat a balanced diet, get plenty of sleep, and do not smoke. Avoid using alcohol or drugs to relax.  Strong support network. Spend time with family, friends, or other people you enjoy being around.Express your feelings and talk things over with someone you trust. Counseling or talktherapy with a mental health professional may be helpful if you are having difficulty managing stress on your own. Medicine is typically not recommended for the treatment of stress.Talk to your health care provider if you think you need medicine for symptoms of stress. HOME CARE INSTRUCTIONS  Keep all follow-up visits as directed by your health care provider.  Take all medicines as directed by your health care provider. SEEK MEDICAL CARE IF:  Your symptoms get worse or you start having new symptoms.  You feel overwhelmed by your problems and can no longer manage them on your own. SEEK IMMEDIATE MEDICAL CARE IF:  You feel like hurting yourself or someone else.   This information is not intended to replace advice given to you by your health care provider. Make sure you discuss any questions you have with your health care provider.   Document Released: 09/09/2000 Document Revised: 04/06/2014 Document Reviewed: 11/08/2012 Elsevier Interactive Patient Education [employer].

## 2015-05-24 NOTE — Progress Notes (Signed)
   Subjective:    Patient ID: Steven Guzman, male    DOB: 15-Apr-1940, 75 y.o.   MRN: [id number]  HPI The patient is a 75 YO man coming in for worsening of his depression. Since the election in November he is very concerned about the future of the country. He is also somewhat depressed about him aging and worrying about change in his health and longevity. He denies SI/HI. Has been on paxil for many years and feels that it is not doing well lately. Sleeping okay at night still.   Review of Systems  Constitutional: Negative for fever, activity change, appetite change, fatigue and unexpected weight change.  Respiratory: Negative for cough, chest tightness, shortness of breath and wheezing.   Cardiovascular: Negative for chest pain, palpitations and leg swelling.  Gastrointestinal: Negative for abdominal pain, diarrhea, constipation and abdominal distention.  Musculoskeletal: Positive for arthralgias.  Skin: Negative.   Neurological: Negative.   Psychiatric/Behavioral: Positive for dysphoric mood and decreased concentration. Negative for suicidal ideas, behavioral problems, sleep disturbance, self-injury and agitation. The patient is not nervous/anxious.       Objective:   Physical Exam  Constitutional: He is oriented to person, place, and time. He appears well-developed and well-nourished.  HENT:  Head: Normocephalic and atraumatic.  Eyes: EOM are normal.  Neck: Normal range of motion.  Cardiovascular: Normal rate.   Pulmonary/Chest: Effort normal and breath sounds normal.  Abdominal: Soft. Bowel sounds are normal. He exhibits no distension. There is no tenderness. There is no rebound.  Musculoskeletal: He exhibits no edema.  Neurological: He is alert and oriented to person, place, and time.  Skin: Skin is warm and dry.  Psychiatric: He has a normal mood and affect. His behavior is normal.   Filed Vitals:   05/24/15 0932  BP: 118/84  Pulse: 61  Temp: 98.8 F (37.1 C)  TempSrc:  Oral  Resp: 18  Height: 5\' 11"  (1.803 m)  Weight: 217 lb (98.431 kg)  SpO2: 93%      Assessment & Plan:

## 2015-05-24 NOTE — Assessment & Plan Note (Signed)
Will have him wean off paxil and wean up lexapro for better symptom control. Also talked to him about getting more involved to feel like he is doing more for the future. Talked to him about stress reduction techniques and self-care time.

## 2015-06-18 ENCOUNTER — Telehealth: Payer: Self-pay | Admitting: Internal Medicine

## 2015-06-18 ENCOUNTER — Other Ambulatory Visit: Payer: Self-pay | Admitting: Acute Care

## 2015-06-18 DIAGNOSIS — F17210 Nicotine dependence, cigarettes, uncomplicated: Principal | ICD-10-CM

## 2015-06-18 MED ORDER — ESCITALOPRAM OXALATE 20 MG PO TABS: 20.0000 mg | ORAL_TABLET | Freq: Every day | ORAL | Status: DC

## 2015-06-18 NOTE — Telephone Encounter (Signed)
Pt called request refill for escitalopram (LEXAPRO) 20 MG tablet to be send to [facility]. Pt stated he started of with 10 mg but he suppose to increase to full strength of 20 mg after 2 week. Please help

## 2015-06-18 NOTE — Telephone Encounter (Signed)
Sent in

## 2015-06-22 ENCOUNTER — Other Ambulatory Visit: Payer: Self-pay | Admitting: Internal Medicine

## 2015-06-24 ENCOUNTER — Other Ambulatory Visit: Payer: Self-pay | Admitting: Internal Medicine

## 2015-06-26 ENCOUNTER — Other Ambulatory Visit: Payer: Self-pay | Admitting: *Deleted

## 2015-06-26 ENCOUNTER — Ambulatory Visit (INDEPENDENT_AMBULATORY_CARE_PROVIDER_SITE_OTHER): Payer: Medicare Other | Admitting: Nurse Practitioner

## 2015-06-26 ENCOUNTER — Ambulatory Visit: Payer: Medicare Other | Admitting: Nurse Practitioner

## 2015-06-26 ENCOUNTER — Encounter: Payer: Self-pay | Admitting: Nurse Practitioner

## 2015-06-26 VITALS — BP 120/78 | HR 60 | Temp 98.5°F | Ht 71.0 in | Wt 213.0 lb

## 2015-06-26 DIAGNOSIS — R220 Localized swelling, mass and lump, head: Secondary | ICD-10-CM

## 2015-06-26 DIAGNOSIS — Z720 Tobacco use: Secondary | ICD-10-CM | POA: Diagnosis not present

## 2015-06-26 DIAGNOSIS — R221 Localized swelling, mass and lump, neck: Principal | ICD-10-CM

## 2015-06-26 DIAGNOSIS — F17210 Nicotine dependence, cigarettes, uncomplicated: Secondary | ICD-10-CM

## 2015-06-26 DIAGNOSIS — J392 Other diseases of pharynx: Secondary | ICD-10-CM

## 2015-06-26 MED ORDER — SPIRONOLACTONE 25 MG PO TABS: 12.5000 mg | ORAL_TABLET | Freq: Every day | ORAL | Status: DC

## 2015-06-26 NOTE — Patient Instructions (Signed)
We will contact you about your referral to ENT.   Please contact Dr. Henrene Pastor about your GI concerns.

## 2015-06-26 NOTE — Progress Notes (Signed)
Patient ID: Steven Guzman, male    DOB: January 27, 1941  Age: 75 y.o. MRN: [id number]  CC: Stomach and Throat   HPI BRIDGE MARZ presents for CC growth in throat.  1) Spiriva at night- not rinsing mouth out  Throat scratchy  Right side saw a white spot the other day- smoker  2) Pt has been having various abdominal complaints- will refer back to GI with whom he is established with.   History Saddiq has a past medical history of Hypertension; Hyperlipidemia; GERD (gastroesophageal reflux disease); Diverticulosis of colon (07/2014); CAD (coronary artery disease); Depression; Aneurysm ([place]); Tobacco abuse; Ventricular tachycardia ([place]); Chronic systolic CHF (congestive heart failure) ([place]); Hiatal hernia; Prostate enlargement (07/2014); Hyperglycemia (10/2012.); AICD (automatic cardioverter/defibrillator) present; Basal cell carcinoma of nose; Complication of anesthesia (10/2014); Anginal pain ([place]); Myocardial infarction [facility]) (1994; 2011); COPD (chronic obstructive pulmonary disease) ([place]); Pneumonia (1946; 2015); Chronic bronchitis ([place]); and Anxiety.   He has past surgical history that includes Foot surgery (Left, 2005); Hemorrhoid banding; Retinal detachment surgery (Right, 2013); Mohs surgery (2008); Colonoscopy; Tenolysis (Right, 12/21/2013); left heart catheterization with coronary angiogram (N/A, 11/25/2012); Implantable cardioverter defibrillator implant (09/06/09); ERCP (N/A, 11/16/2014); V-tach ablation (12/21/2014); Inguinal hernia repair (Right, ~ 1995); Eye surgery; Cataract extraction w/ intraocular lens  implant, bilateral (Bilateral, ~ 2011); and Cardiac catheterization (N/A, 12/21/2014).   His family history includes CAD in his father and mother; Heart attack in his brother; Hypertension in his brother, father, and mother. There is no history of Stroke.He reports that he has been smoking Cigarettes.  He has a 27.5 pack-year smoking history. He has never used smokeless tobacco. He reports  that he drinks alcohol. He reports that he does not use illicit drugs.  Outpatient Prescriptions Prior to Visit  Medication Sig Dispense Refill  . amiodarone (PACERONE) 200 MG tablet Take 1 tablet (200 mg total) by mouth daily. Take one tablet by mouth in the morning 60 tablet 2  . atorvastatin (LIPITOR) 80 MG tablet Take 1 tablet (80 mg total) by mouth daily. 30 tablet 6  . benazepril (LOTENSIN) 10 MG tablet Take 1 tablet (10 mg total) by mouth daily. 90 tablet 3  . busPIRone (BUSPAR) 15 MG tablet take 1/2 tablet by mouth twice a day 60 tablet 5  . carvedilol (COREG) 12.5 MG tablet Take 1 tablet (12.5 mg total) by mouth 2 (two) times daily with a meal. 180 tablet 3  . cetirizine (ZYRTEC) 10 MG tablet Take 10 mg by mouth daily as needed for allergies.    . Choline Fenofibrate (TRILIPIX) 135 MG capsule Take 1 capsule (135 mg total) by mouth daily. 30 capsule 6  . escitalopram (LEXAPRO) 20 MG tablet Take 1 tablet (20 mg total) by mouth daily. 30 tablet 6  . furosemide (LASIX) 40 MG tablet Take 0.5 tablets (20 mg total) by mouth daily. 30 tablet 3  . gabapentin (NEURONTIN) 100 MG capsule take 3 capsules by mouth twice a day 540 capsule 2  . Ipratropium-Albuterol (COMBIVENT RESPIMAT) 20-100 MCG/ACT AERS respimat Inhale 1 puff into the lungs every 6 (six) hours. Shortness of breath or wheezing    . nitroGLYCERIN (NITROSTAT) 0.4 MG SL tablet Place 1 tablet (0.4 mg total) under the tongue every 5 (five) minutes x 3 doses as needed for chest pain. 25 tablet 11  . omeprazole (PRILOSEC) 20 MG capsule Take 20 mg by mouth 2 (two) times daily.    . rivaroxaban (XARELTO) 20 MG TABS tablet Take 1 tablet (20 mg total) by  mouth daily with supper. 30 tablet 11  . spironolactone (ALDACTONE) 25 MG tablet Take 0.5 tablets (12.5 mg total) by mouth daily. 45 tablet 2  . SYMBICORT 160-4.5 MCG/ACT inhaler inhale 2 puffs by mouth every morning and 2 puffs every evening ; SEPARATE DOSES BY 12 HOURS 10.2 Inhaler 3  .  tamsulosin (FLOMAX) 0.4 MG CAPS capsule take 1 capsule by mouth once daily after SUPPER 30 capsule 3  . tamsulosin (FLOMAX) 0.4 MG CAPS capsule take 1 capsule by mouth once daily after SUPPER 30 capsule 3  . tiotropium (SPIRIVA) 18 MCG inhalation capsule Place 1 capsule (18 mcg total) into inhaler and inhale daily. 30 capsule 0  . PARoxetine (PAXIL) 40 MG tablet take 1 tablet by mouth every morning (Patient not taking: Reported on 06/26/2015) 30 tablet 11   No facility-administered medications prior to visit.    ROS Review of Systems  Constitutional: Negative for fever, chills, diaphoresis and fatigue.  HENT: Positive for sore throat. Negative for congestion, dental problem, drooling, ear discharge, postnasal drip, rhinorrhea, sneezing, tinnitus, trouble swallowing and voice change.   Respiratory: Positive for cough. Negative for chest tightness, shortness of breath and wheezing.   Cardiovascular: Negative for chest pain, palpitations and leg swelling.  Gastrointestinal: Positive for nausea and abdominal pain. Negative for vomiting, diarrhea, constipation, blood in stool, abdominal distention, anal bleeding and rectal pain.  Skin: Negative for rash.    Objective:  BP 120/78 mmHg  Pulse 60  Temp(Src) 98.5 F (36.9 C) (Oral)  Ht 5\' 11"  (1.803 m)  Wt 213 lb (96.616 kg)  BMI 29.72 kg/m2  SpO2 95%  Physical Exam  Constitutional: He is oriented to person, place, and time. He appears well-developed and well-nourished. No distress.  HENT:  Head: Normocephalic and atraumatic.  Right Ear: External ear normal.  Left Ear: External ear normal.  Mouth/Throat:    Pearly white growth in lower right back of throat  Cardiovascular: Normal rate and regular rhythm.   Pulmonary/Chest: Effort normal and breath sounds normal. No respiratory distress. He has no wheezes. He has no rales. He exhibits no tenderness.  Abdominal: Soft. Bowel sounds are normal. He exhibits no distension and no mass. There  is no tenderness. There is no rebound and no guarding.  Neurological: He is alert and oriented to person, place, and time.  Skin: Skin is warm and dry. No rash noted. He is not diaphoretic.  Psychiatric: He has a normal mood and affect. His behavior is normal. Judgment and thought content normal.   Assessment & Plan:   Daruis was seen today for stomach and throat.  Diagnoses and all orders for this visit:  Mass of throat -     Ambulatory referral to ENT  Cigarette smoker   I have discontinued Mr. Elizardo PARoxetine. I am also having him maintain his omeprazole, cetirizine, benazepril, carvedilol, gabapentin, furosemide, atorvastatin, amiodarone, Choline Fenofibrate, SYMBICORT, Ipratropium-Albuterol, nitroGLYCERIN, rivaroxaban, tiotropium, busPIRone, escitalopram, tamsulosin, tamsulosin, and spironolactone.  No orders of the defined types were placed in this encounter.     Follow-up: Return if symptoms worsen or fail to improve.

## 2015-06-26 NOTE — Progress Notes (Signed)
Pre visit review using our clinic review tool, if applicable. No additional management support is needed unless otherwise documented below in the visit note. 

## 2015-06-30 DIAGNOSIS — J392 Other diseases of pharynx: Secondary | ICD-10-CM | POA: Insufficient documentation

## 2015-06-30 DIAGNOSIS — R221 Localized swelling, mass and lump, neck: Principal | ICD-10-CM

## 2015-06-30 NOTE — Assessment & Plan Note (Signed)
Pt is a smoker and has a new pearly white mass of the right lower area of the oropharynx. Referral to ENT for eval

## 2015-07-01 ENCOUNTER — Ambulatory Visit (INDEPENDENT_AMBULATORY_CARE_PROVIDER_SITE_OTHER): Payer: Medicare Other | Admitting: Interventional Cardiology

## 2015-07-01 ENCOUNTER — Encounter: Payer: Self-pay | Admitting: Interventional Cardiology

## 2015-07-01 ENCOUNTER — Ambulatory Visit
Admission: RE | Admit: 2015-07-01 | Discharge: 2015-07-01 | Disposition: A | Discharge status: Home / Self Care | Payer: Medicare Other | Source: Ambulatory Visit | Attending: Interventional Cardiology | Admitting: Interventional Cardiology

## 2015-07-01 VITALS — BP 90/56 | HR 60 | Ht 71.0 in | Wt 211.6 lb

## 2015-07-01 DIAGNOSIS — I5022 Chronic systolic (congestive) heart failure: Secondary | ICD-10-CM

## 2015-07-01 DIAGNOSIS — Z79899 Other long term (current) drug therapy: Secondary | ICD-10-CM | POA: Diagnosis not present

## 2015-07-01 DIAGNOSIS — Z720 Tobacco use: Secondary | ICD-10-CM

## 2015-07-01 DIAGNOSIS — I10 Essential (primary) hypertension: Secondary | ICD-10-CM

## 2015-07-01 DIAGNOSIS — I25810 Atherosclerosis of coronary artery bypass graft(s) without angina pectoris: Secondary | ICD-10-CM

## 2015-07-01 DIAGNOSIS — Z95810 Presence of automatic (implantable) cardiac defibrillator: Secondary | ICD-10-CM

## 2015-07-01 LAB — TSH: TSH: 1.85 m[IU]/L (ref 0.40–4.50)

## 2015-07-01 LAB — COMPREHENSIVE METABOLIC PANEL
ALT: 16 U/L (ref 9–46)
AST: 14 U/L (ref 10–35)
Albumin: 4 g/dL (ref 3.6–5.1)
Alkaline Phosphatase: 33 U/L — ABNORMAL LOW (ref 40–115)
BILIRUBIN TOTAL: 0.7 mg/dL (ref 0.2–1.2)
BUN: 10 mg/dL (ref 7–25)
CO2: 26 mmol/L (ref 20–31)
CREATININE: 1.06 mg/dL (ref 0.70–1.18)
Calcium: 9.1 mg/dL (ref 8.6–10.3)
Chloride: 101 mmol/L (ref 98–110)
GLUCOSE: 78 mg/dL (ref 65–99)
Potassium: 4.4 mmol/L (ref 3.5–5.3)
SODIUM: 137 mmol/L (ref 135–146)
Total Protein: 6.1 g/dL (ref 6.1–8.1)

## 2015-07-01 NOTE — Progress Notes (Signed)
Cardiology Office Note   Date:  07/01/2015   ID:  Steven, Guzman [birth year], MRN [id number]  PCP:  Hoyt Koch, MD  Cardiologist:  Sinclair Grooms, MD   Chief Complaint  Patient presents with  . Coronary Artery Disease      History of Present Illness: Steven Guzman is a 75 y.o. male who presents for Ischemic cardiomyopathy with EF 35%, COPD, hypertension, recent low blood pressures, CAD with prior coronary bypass grafting, continued tobacco abuse, ventricular tachycardia, amiodarone use, and implantable cardiac defibrillator.  Feels lightheaded and weak today. No chest discomfort. Feels sluggish all the time. Has chronic dyspnea on exertion. Some dyspnea when recumbent. Continues to smoke cigarettes. No angina. No AICD discharge. No palpitations.    Past Medical History  Diagnosis Date  . Hypertension   . Hyperlipidemia   . GERD (gastroesophageal reflux disease)   . Diverticulosis of colon 07/2014    noted on CT  . CAD (coronary artery disease)     a. s/p MI in 1994 with PCI to LAD at that time b. cath 10/2012 demonstrated EF 30%, inferior akinesis with mild hypokinesis of all walls, patent LAD and RCA stents; ostial PDA with 80-90% obstruction with medical therapy recommended   . Depression   . Aneurysm ([place])     abdominal <4. 3.6 cm on ultrasound 09/2014.   . Tobacco abuse   . Ventricular tachycardia ([place])     a. 08/2009 s/p BSX E110 Teligen 100 AICD, ser#: [id number];  b. 08/2008 VT req ATP - detection reprogrammed from 160 to 150. c. EPS and VT ablation by Dr. Lovena Le 12/21/2014  . Chronic systolic CHF (congestive heart failure) (HCC)     EF 30 to 35 % as of 09/2014.   [name] Hiatal hernia   . Prostate enlargement 07/2014    observed on CT  . Hyperglycemia 10/2012.  [name] AICD (automatic cardioverter/defibrillator) present   . Basal cell carcinoma of nose     S/P MOHS  . Complication of anesthesia 10/2014    "had to have defibrillator w/ERCP"  . Anginal pain ([place])    . Myocardial infarction [facility]) 1994; 2011  . COPD (chronic obstructive pulmonary disease) ([place])     a. followed by pulmonary, COPD GOLD stage II  . Pneumonia 1946; 2015  . Chronic bronchitis ([place])     "was having it q yr; haven't had it in a couple years" (12/21/2014)  . Anxiety     Past Surgical History  Procedure Laterality Date  . Foot surgery Left 2005    "fixed bone that stuck out in my ankle area"  . Hemorrhoid banding    . Retinal detachment surgery Right 2013  . Mohs surgery  2008    nose, skin graft  . Colonoscopy    . Tenolysis Right 12/21/2013    Procedure: TENOLYSIS FLEXOR CARPI RADIALIS ,DEBRIDEMENT RIGHT JOINT WRIST,DEBRIDEMENT SCAPHOTRAPEZIAL TRAPEZOID, REPAIR OF EXTENSOR HOOD;  Surgeon: Daryll Brod, MD;  Location: [facility];  Service: Orthopedics;  Laterality: Right;  . Left heart catheterization with coronary angiogram N/A 11/25/2012    demonstrated EF 30%, inferior akinesis with mild hypokinesis of all walls, patent LAD and RCA stents; ostial PDA with 80-90% obstruction with medical therapy recommended  . Implantable cardioverter defibrillator implant  09/06/09    BSX dual chamber ICD implanted in [place] for cardiac arrest 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  . Ercp N/A 11/16/2014    Procedure: ENDOSCOPIC RETROGRADE CHOLANGIOPANCREATOGRAPHY (ERCP);  Surgeon: [name], MD;  Location: [facility];  Service: Endoscopy;  Laterality: N/A;  . V-tach ablation  12/21/2014  . Inguinal hernia repair Right ~ 1995  . Eye surgery    . Cataract extraction w/ intraocular lens  implant, bilateral Bilateral ~ 2011  . Electrophysiologic study N/A 12/21/2014    Procedure: V Tach Ablation;  Surgeon: [name], MD;  Location: [facility];  Service: Cardiovascular;  Laterality: N/A;     Current Outpatient Prescriptions  Medication Sig Dispense Refill  . amiodarone (PACERONE) 200 MG tablet Take 1 tablet (200 mg total) by mouth daily. Take one tablet by mouth in the  morning 60 tablet 2  . atorvastatin (LIPITOR) 80 MG tablet Take 1 tablet (80 mg total) by mouth daily. 30 tablet 6  . benazepril (LOTENSIN) 10 MG tablet Take 1 tablet (10 mg total) by mouth daily. 90 tablet 3  . busPIRone (BUSPAR) 15 MG tablet take 1/2 tablet by mouth twice a day 60 tablet 5  . carvedilol (COREG) 12.5 MG tablet Take 1 tablet (12.5 mg total) by mouth 2 (two) times daily with a meal. 180 tablet 3  . cetirizine (ZYRTEC) 10 MG tablet Take 10 mg by mouth daily as needed for allergies.    . Choline Fenofibrate (TRILIPIX) 135 MG capsule Take 1 capsule (135 mg total) by mouth daily. 30 capsule 6  . escitalopram (LEXAPRO) 20 MG tablet Take 1 tablet (20 mg total) by mouth daily. 30 tablet 6  . furosemide (LASIX) 40 MG tablet Take 0.5 tablets (20 mg total) by mouth daily. 30 tablet 3  . gabapentin (NEURONTIN) 100 MG capsule take 3 capsules by mouth twice a day 540 capsule 2  . Ipratropium-Albuterol (COMBIVENT RESPIMAT) 20-100 MCG/ACT AERS respimat Inhale 1 puff into the lungs every 6 (six) hours. Shortness of breath or wheezing    . nitroGLYCERIN (NITROSTAT) 0.4 MG SL tablet Place 1 tablet (0.4 mg total) under the tongue every 5 (five) minutes x 3 doses as needed for chest pain. 25 tablet 11  . omeprazole (PRILOSEC) 20 MG capsule Take 20 mg by mouth 2 (two) times daily.    . rivaroxaban (XARELTO) 20 MG TABS tablet Take 1 tablet (20 mg total) by mouth daily with supper. 30 tablet 11  . spironolactone (ALDACTONE) 25 MG tablet Take 0.5 tablets (12.5 mg total) by mouth daily. 45 tablet 2  . SYMBICORT 160-4.5 MCG/ACT inhaler inhale 2 puffs by mouth every morning and 2 puffs every evening ; SEPARATE DOSES BY 12 HOURS 10.2 Inhaler 3  . tamsulosin (FLOMAX) 0.4 MG CAPS capsule take 1 capsule by mouth once daily after SUPPER 30 capsule 3  . tiotropium (SPIRIVA) 18 MCG inhalation capsule Place 1 capsule (18 mcg total) into inhaler and inhale daily. 30 capsule 0   No current facility-administered  medications for this visit.    Allergies:   Sulfa antibiotics    Social History:  The patient  reports that he has been smoking Cigarettes.  He has a 27.5 pack-year smoking history. He has never used smokeless tobacco. He reports that he drinks alcohol. He reports that he does not use illicit drugs.   Family History:  The patient's family history includes CAD in his father and mother; Heart attack in his brother; Hypertension in his brother, father, and mother. There is no history of Stroke.    ROS:  Please see the history of present illness.   Otherwise, review of systems are positive for Back  pain, dizziness, easy bruising, cough, depression, shortness of breath, anxiety, constipation, and difficulty with balance.   All other systems are reviewed and negative.    PHYSICAL EXAM: VS:  BP 90/56 mmHg  Pulse 60  Ht 5\' 11"  (1.803 m)  Wt 211 lb 9.6 oz (95.981 kg)  BMI 29.53 kg/m2 , BMI Body mass index is 29.53 kg/(m^2). GEN: Well nourished, well developed, in no acute distress. Smells heavily of tobacco HEENT: normal Neck: no JVD, carotid bruits, or masses Cardiac: RRR.  There is no murmur, rub, or gallop. There is no edema. Respiratory:  clear to auscultation bilaterally, normal work of breathing. GI: soft, nontender, nondistended, + BS MS: no deformity or atrophy Skin: warm and dry, no rash Neuro:  Strength and sensation are intact Psych: euthymic mood, full affect   EKG:  EKG is not ordered today.    Recent Labs: 09/27/2014: TSH 1.26 12/06/2014: ALT 34; Magnesium 1.8 12/12/2014: Hemoglobin 14.4; Platelets 203.0 04/03/2015: BUN 13; Creat 1.12; Potassium 4.2; Sodium 137    Lipid Panel    Component Value Date/Time   CHOL 150 11/25/2012 0330   TRIG 98 11/25/2012 0330   HDL 43 11/25/2012 0330   CHOLHDL 3.5 11/25/2012 0330   VLDL 20 11/25/2012 0330   LDLCALC 87 11/25/2012 0330      Wt Readings from Last 3 Encounters:  07/01/15 211 lb 9.6 oz (95.981 kg)  06/26/15 213 lb  (96.616 kg)  05/24/15 217 lb (98.431 kg)      Other studies Reviewed: Additional studies/ records that were reviewed today include: None. The findings include none.    ASSESSMENT AND PLAN:  1. Chronic systolic heart failure (HCC) Past LVEF 35%  2. ICD (implantable cardioverter-defibrillator) in place No recent discharge  3. Hypotension Low blood pressure today with recordings in the 85/60 mmHg range  4. Coronary artery disease involving coronary bypass graft of native heart without angina pectoris Asymptomatic  5. On amiodarone therapy Continues on amiodarone and needs screening for toxicity  6. Tobacco abuse Encouraged to discontinue smoking    Current medicines are reviewed at length with the patient today.  The patient has the following concerns regarding medicines: .  The following changes/actions have been instituted:    Discontinue Aldactone  Check blood pressure in a.m. and if systolic is less than 90, hold furosemide.  PA and lateral chest x-ray, TSH, and CMET  TSH and hepatic panel in 6 months  Labs/ tests ordered today include:  No orders of the defined types were placed in this encounter.     Disposition:   FU with HS in 6 months  Signed, Sinclair Grooms, MD  07/01/2015 12:13 PM    [facility] [place], [place], [place]  [address] Phone: [phone]; Fax: [phone]

## 2015-07-01 NOTE — Patient Instructions (Signed)
Medication Instructions:  Your physician has recommended you make the following change in your medication:  STOP Aldactone  HOLD Lasix tomorrow morning if your systolic (top number of your blood pressure) in less than 90    Labwork: Tsh, Cmet today    Testing/Procedures: A chest x-ray takes a picture of the organs and structures inside the chest, including the heart, lungs, and blood vessels. This test can show several things, including, whether the heart is enlarges; whether fluid is building up in the lungs; and whether pacemaker / defibrillator leads are still in place. ( To be done at [address])    Follow-Up: Your physician wants you to follow-up in: 6 months with Dr.Smith You will receive a reminder letter in the mail two months in advance. If you don't receive a letter, please call our office to schedule the follow-up appointment.     Any Other Special Instructions Will Be Listed Below (If Applicable).     If you need a refill on your cardiac medications before your next appointment, please call your pharmacy.

## 2015-07-12 ENCOUNTER — Other Ambulatory Visit: Payer: Self-pay | Admitting: *Deleted

## 2015-07-12 DIAGNOSIS — I5022 Chronic systolic (congestive) heart failure: Secondary | ICD-10-CM

## 2015-07-12 DIAGNOSIS — Z95810 Presence of automatic (implantable) cardiac defibrillator: Secondary | ICD-10-CM

## 2015-07-12 DIAGNOSIS — I472 Ventricular tachycardia, unspecified: Secondary | ICD-10-CM

## 2015-07-12 MED ORDER — AMIODARONE HCL 200 MG PO TABS: 200.0000 mg | ORAL_TABLET | Freq: Every day | ORAL | Status: DC

## 2015-07-17 ENCOUNTER — Ambulatory Visit (INDEPENDENT_AMBULATORY_CARE_PROVIDER_SITE_OTHER): Payer: Medicare Other | Admitting: *Deleted

## 2015-07-17 ENCOUNTER — Encounter: Payer: Self-pay | Admitting: Internal Medicine

## 2015-07-17 DIAGNOSIS — Z95810 Presence of automatic (implantable) cardiac defibrillator: Secondary | ICD-10-CM | POA: Diagnosis not present

## 2015-07-17 LAB — CUP PACEART INCLINIC DEVICE CHECK
HIGH POWER IMPEDANCE MEASURED VALUE: 43 Ohm
HIGH POWER IMPEDANCE MEASURED VALUE: 56 Ohm
Implantable Lead Implant Date: 20110610
Implantable Lead Implant Date: 20110610
Implantable Lead Model: 185
Implantable Lead Serial Number: 28681386
Lead Channel Pacing Threshold Amplitude: 0.9 V
Lead Channel Pacing Threshold Amplitude: 0.9 V
Lead Channel Pacing Threshold Pulse Width: 0.4 ms
Lead Channel Pacing Threshold Pulse Width: 0.4 ms
Lead Channel Sensing Intrinsic Amplitude: 12.9 mV
Lead Channel Setting Pacing Amplitude: 2 V
Lead Channel Setting Sensing Sensitivity: 0.6 mV
MDC IDC LEAD LOCATION: 753859
MDC IDC LEAD LOCATION: 753860
MDC IDC LEAD MODEL: 4135
MDC IDC LEAD SERIAL: 339643
MDC IDC MSMT LEADCHNL RA IMPEDANCE VALUE: 642 Ohm
MDC IDC MSMT LEADCHNL RA SENSING INTR AMPL: 2.1 mV
MDC IDC MSMT LEADCHNL RV IMPEDANCE VALUE: 481 Ohm
MDC IDC SESS DTM: 20170419040000
MDC IDC SET LEADCHNL RV PACING AMPLITUDE: 2 V
MDC IDC SET LEADCHNL RV PACING PULSEWIDTH: 0.4 ms
Pulse Gen Serial Number: 164892

## 2015-07-17 NOTE — Progress Notes (Signed)
Pacemaker check in clinic. Normal device function. Thresholds, sensing, impedances consistent with previous measurements. Device programmed to maximize longevity. 407 mode switches (4.2%) + xarelto. No EGMs available. Device programmed at appropriate safety margins. Histogram distribution appropriate for patient activity level. Device programmed to optimize intrinsic conduction. Estimated longevity 2.25-59yrs. Patient enrolled in TTM's with [employer]. ROV with GT 12/2015 Patient education completed.

## 2015-07-18 ENCOUNTER — Telehealth: Payer: Self-pay | Admitting: Pulmonary Disease

## 2015-07-18 MED ORDER — BUDESONIDE-FORMOTEROL FUMARATE 160-4.5 MCG/ACT IN AERO: INHALATION_SPRAY | RESPIRATORY_TRACT | Status: DC

## 2015-07-18 NOTE — Telephone Encounter (Signed)
lmtcb x1 for pt. 

## 2015-07-18 NOTE — Telephone Encounter (Signed)
Patient returned call, CB is [phone]

## 2015-07-18 NOTE — Telephone Encounter (Signed)
Spoke with pt. He needs a refill on Symbicort. This has been sent in. Nothing further was needed.

## 2015-07-26 ENCOUNTER — Emergency Department (HOSPITAL_COMMUNITY): Payer: Medicare Other

## 2015-07-26 ENCOUNTER — Emergency Department (HOSPITAL_COMMUNITY)
Admission: EM | Admit: 2015-07-26 | Discharge: 2015-07-27 | Disposition: A | Discharge status: Home / Self Care | Payer: Medicare Other | Attending: Emergency Medicine | Admitting: Emergency Medicine

## 2015-07-26 ENCOUNTER — Encounter (HOSPITAL_COMMUNITY): Payer: Self-pay | Admitting: Emergency Medicine

## 2015-07-26 DIAGNOSIS — Z8701 Personal history of pneumonia (recurrent): Secondary | ICD-10-CM | POA: Diagnosis not present

## 2015-07-26 DIAGNOSIS — Z95810 Presence of automatic (implantable) cardiac defibrillator: Secondary | ICD-10-CM | POA: Insufficient documentation

## 2015-07-26 DIAGNOSIS — Z79899 Other long term (current) drug therapy: Secondary | ICD-10-CM | POA: Diagnosis not present

## 2015-07-26 DIAGNOSIS — I10 Essential (primary) hypertension: Secondary | ICD-10-CM | POA: Insufficient documentation

## 2015-07-26 DIAGNOSIS — Z7901 Long term (current) use of anticoagulants: Secondary | ICD-10-CM | POA: Diagnosis not present

## 2015-07-26 DIAGNOSIS — Z9889 Other specified postprocedural states: Secondary | ICD-10-CM | POA: Insufficient documentation

## 2015-07-26 DIAGNOSIS — I5022 Chronic systolic (congestive) heart failure: Secondary | ICD-10-CM | POA: Insufficient documentation

## 2015-07-26 DIAGNOSIS — J449 Chronic obstructive pulmonary disease, unspecified: Secondary | ICD-10-CM

## 2015-07-26 DIAGNOSIS — F329 Major depressive disorder, single episode, unspecified: Secondary | ICD-10-CM | POA: Diagnosis not present

## 2015-07-26 DIAGNOSIS — F419 Anxiety disorder, unspecified: Secondary | ICD-10-CM | POA: Insufficient documentation

## 2015-07-26 DIAGNOSIS — J441 Chronic obstructive pulmonary disease with (acute) exacerbation: Secondary | ICD-10-CM | POA: Diagnosis not present

## 2015-07-26 DIAGNOSIS — R0602 Shortness of breath: Secondary | ICD-10-CM | POA: Diagnosis present

## 2015-07-26 DIAGNOSIS — R509 Fever, unspecified: Secondary | ICD-10-CM | POA: Diagnosis not present

## 2015-07-26 DIAGNOSIS — K219 Gastro-esophageal reflux disease without esophagitis: Secondary | ICD-10-CM | POA: Diagnosis not present

## 2015-07-26 DIAGNOSIS — Z85828 Personal history of other malignant neoplasm of skin: Secondary | ICD-10-CM | POA: Diagnosis not present

## 2015-07-26 DIAGNOSIS — I252 Old myocardial infarction: Secondary | ICD-10-CM | POA: Diagnosis not present

## 2015-07-26 DIAGNOSIS — Z7951 Long term (current) use of inhaled steroids: Secondary | ICD-10-CM | POA: Diagnosis not present

## 2015-07-26 DIAGNOSIS — Z87438 Personal history of other diseases of male genital organs: Secondary | ICD-10-CM | POA: Diagnosis not present

## 2015-07-26 DIAGNOSIS — I25119 Atherosclerotic heart disease of native coronary artery with unspecified angina pectoris: Secondary | ICD-10-CM | POA: Diagnosis not present

## 2015-07-26 DIAGNOSIS — E785 Hyperlipidemia, unspecified: Secondary | ICD-10-CM | POA: Insufficient documentation

## 2015-07-26 DIAGNOSIS — F17210 Nicotine dependence, cigarettes, uncomplicated: Secondary | ICD-10-CM | POA: Insufficient documentation

## 2015-07-26 DIAGNOSIS — R05 Cough: Secondary | ICD-10-CM | POA: Diagnosis not present

## 2015-07-26 LAB — BASIC METABOLIC PANEL
ANION GAP: 10 (ref 5–15)
BUN: 10 mg/dL (ref 6–20)
CALCIUM: 9.3 mg/dL (ref 8.9–10.3)
CO2: 22 mmol/L (ref 22–32)
CREATININE: 1.3 mg/dL — AB (ref 0.61–1.24)
Chloride: 107 mmol/L (ref 101–111)
GFR, EST AFRICAN AMERICAN: 60 mL/min — AB (ref >60)
GFR, EST NON AFRICAN AMERICAN: 52 mL/min — AB (ref >60)
Glucose, Bld: 92 mg/dL (ref 65–99)
Potassium: 4.2 mmol/L (ref 3.5–5.1)
SODIUM: 139 mmol/L (ref 135–145)

## 2015-07-26 LAB — CBC
HCT: 41.3 % (ref 39.0–52.0)
Hemoglobin: 13.7 g/dL (ref 13.0–17.0)
MCH: 31.4 pg (ref 26.0–34.0)
MCHC: 33.2 g/dL (ref 30.0–36.0)
MCV: 94.7 fL (ref 78.0–100.0)
PLATELETS: 216 10*3/uL (ref 150–400)
RBC: 4.36 MIL/uL (ref 4.22–5.81)
RDW: 14.4 % (ref 11.5–15.5)
WBC: 8 10*3/uL (ref 4.0–10.5)

## 2015-07-26 LAB — RAPID STREP SCREEN (MED CTR MEBANE ONLY): Streptococcus, Group A Screen (Direct): NEGATIVE

## 2015-07-26 LAB — I-STAT TROPONIN, ED: TROPONIN I, POC: 0.01 ng/mL (ref 0.00–0.08)

## 2015-07-26 MED ORDER — METHYLPREDNISOLONE SODIUM SUCC 125 MG IJ SOLR
125.0000 mg | Freq: Once | INTRAMUSCULAR | Status: AC
  Administered 2015-07-26: 125 mg via INTRAVENOUS
  Filled 2015-07-26: qty 2

## 2015-07-26 MED ORDER — IPRATROPIUM-ALBUTEROL 0.5-2.5 (3) MG/3ML IN SOLN
3.0000 mL | Freq: Once | RESPIRATORY_TRACT | Status: AC
  Administered 2015-07-26: 3 mL via RESPIRATORY_TRACT
  Filled 2015-07-26: qty 3

## 2015-07-26 NOTE — ED Provider Notes (Signed)
CSN: [id number]     Arrival date & time 07/26/15  2153 History   First MD Initiated Contact with Patient 07/26/15 2211     Chief Complaint  Patient presents with  . Cough  . Shortness of Breath     (Consider location/radiation/quality/duration/timing/severity/associated sxs/prior Treatment) Patient is a 75 y.o. male presenting with cough and shortness of breath. The history is provided by the patient. No language interpreter was used.  Cough Cough characteristics:  Productive Sputum characteristics:  Clear Severity:  Moderate Onset quality:  Gradual Duration:  1 day Timing:  Constant Progression:  Worsening Chronicity:  New Smoker: yes   Context: upper respiratory infection   Relieved by:  Nothing Worsened by:  Nothing tried Ineffective treatments:  None tried Associated symptoms: shortness of breath   Risk factors: recent infection   Shortness of Breath Associated symptoms: cough    Pt reports he smokes a pack of cigarettes a day.  He has COPD.  Pt felt like he had a fever earlier today.  Pt worried he could be getting pneumonia.  Pt had pneumonia in December.  Pt is followed by Dr. Lake Bells. Past Medical History  Diagnosis Date  . Hypertension   . Hyperlipidemia   . GERD (gastroesophageal reflux disease)   . Diverticulosis of colon 07/2014    noted on CT  . CAD (coronary artery disease)     a. s/p MI in 1994 with PCI to LAD at that time b. cath 10/2012 demonstrated EF 30%, inferior akinesis with mild hypokinesis of all walls, patent LAD and RCA stents; ostial PDA with 80-90% obstruction with medical therapy recommended   . Depression   . Aneurysm ([place])     abdominal <4. 3.6 cm on ultrasound 09/2014.   . Tobacco abuse   . Ventricular tachycardia ([place])     a. 08/2009 s/p BSX E110 Teligen 100 AICD, ser#: [id number];  b. 08/2008 VT req ATP - detection reprogrammed from 160 to 150. c. EPS and VT ablation by Dr. Lovena Le 12/21/2014  . Chronic systolic CHF (congestive heart failure)  (HCC)     EF 30 to 35 % as of 09/2014.   [name] Hiatal hernia   . Prostate enlargement 07/2014    observed on CT  . Hyperglycemia 10/2012.  [name] AICD (automatic cardioverter/defibrillator) present   . Basal cell carcinoma of nose     S/P MOHS  . Complication of anesthesia 10/2014    "had to have defibrillator w/ERCP"  . Anginal pain ([place])   . Myocardial infarction [facility]) 1994; 2011  . COPD (chronic obstructive pulmonary disease) ([place])     a. followed by pulmonary, COPD GOLD stage II  . Pneumonia 1946; 2015  . Chronic bronchitis ([place])     "was having it q yr; haven't had it in a couple years" (12/21/2014)  . Anxiety    Past Surgical History  Procedure Laterality Date  . Foot surgery Left 2005    "fixed bone that stuck out in my ankle area"  . Hemorrhoid banding    . Retinal detachment surgery Right 2013  . Mohs surgery  2008    nose, skin graft  . Colonoscopy    . Tenolysis Right 12/21/2013    Procedure: TENOLYSIS FLEXOR CARPI RADIALIS ,DEBRIDEMENT RIGHT JOINT WRIST,DEBRIDEMENT SCAPHOTRAPEZIAL TRAPEZOID, REPAIR OF EXTENSOR HOOD;  Surgeon: Daryll Brod, MD;  Location: [facility];  Service: Orthopedics;  Laterality: Right;  . Left heart catheterization with coronary angiogram N/A 11/25/2012    demonstrated EF 30%,  inferior akinesis with mild hypokinesis of all walls, patent LAD and RCA stents; ostial PDA with 80-90% obstruction with medical therapy recommended  . Implantable cardioverter defibrillator implant  09/06/09    BSX dual chamber ICD implanted in [place] for cardiac arrest 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  . Ercp N/A 11/16/2014    Procedure: ENDOSCOPIC RETROGRADE CHOLANGIOPANCREATOGRAPHY (ERCP);  Surgeon: [name], MD;  Location: [facility];  Service: Endoscopy;  Laterality: N/A;  . V-tach ablation  12/21/2014  . Inguinal hernia repair Right ~ 1995  . Eye surgery    . Cataract extraction w/ intraocular lens  implant, bilateral Bilateral ~ 2011  . Electrophysiologic study N/A  12/21/2014    Procedure: V Tach Ablation;  Surgeon: [name], MD;  Location: [facility];  Service: Cardiovascular;  Laterality: N/A;   Family History  Problem Relation Age of Onset  . Heart attack Brother   . CAD Father   . CAD Mother   . Hypertension Mother   . Hypertension Father   . Hypertension Brother   . Stroke Neg Hx    Social History  Substance Use Topics  . Smoking status: Current Every Day Smoker -- 0.50 packs/day for 55 years    Types: Cigarettes  . Smokeless tobacco: Never Used     Comment: up to 1ppd  . Alcohol Use: 0.0 oz/week    0 Standard drinks or equivalent per week     Comment: 12/21/2014 "might have a couple glasses of wine/year"    Review of Systems  Respiratory: Positive for cough and shortness of breath.   All other systems reviewed and are negative.     Allergies  Sulfa antibiotics  Home Medications   Prior to Admission medications   Medication Sig Start Date End Date Taking? Authorizing Provider  amiodarone (PACERONE) 200 MG tablet Take 1 tablet (200 mg total) by mouth daily. Take one tablet by mouth in the morning 07/12/15  Yes [name], MD  atorvastatin (LIPITOR) 80 MG tablet Take 1 tablet (80 mg total) by mouth daily. Patient taking differently: Take 80 mg by mouth daily at 6 PM.  01/11/15  Yes [name], MD  benazepril (LOTENSIN) 10 MG tablet Take 1 tablet (10 mg total) by mouth daily. 10/29/14  Yes Belva Crome, MD  budesonide-formoterol (SYMBICORT) 160-4.5 MCG/ACT inhaler inhale 2 puffs by mouth every morning and 2 puffs every evening ; SEPARATE DOSES BY 12 HOURS 07/18/15  Yes Juanito Doom, MD  busPIRone (BUSPAR) 15 MG tablet take 1/2 tablet by mouth twice a day 04/25/15  Yes Hoyt Koch, MD  carvedilol (COREG) 12.5 MG tablet Take 1 tablet (12.5 mg total) by mouth 2 (two) times daily with a meal. 10/29/14  Yes Belva Crome, MD  cetirizine (ZYRTEC) 10 MG tablet Take 10 mg by mouth daily as needed for allergies.    Yes Historical Provider, MD  Choline Fenofibrate (TRILIPIX) 135 MG capsule Take 1 capsule (135 mg total) by mouth daily. 02/28/15  Yes Belva Crome, MD  escitalopram (LEXAPRO) 20 MG tablet Take 1 tablet (20 mg total) by mouth daily. 06/18/15  Yes Hoyt Koch, MD  furosemide (LASIX) 40 MG tablet Take 0.5 tablets (20 mg total) by mouth daily. 01/04/15  Yes [name], MD  gabapentin (NEURONTIN) 100 MG capsule take 3 capsules by mouth twice a day 12/28/14  Yes Hoyt Koch, MD  Ipratropium-Albuterol (COMBIVENT RESPIMAT) 20-100 MCG/ACT AERS respimat Inhale 1 puff  into the lungs every 6 (six) hours. Shortness of breath or wheezing   Yes Historical Provider, MD  nitroGLYCERIN (NITROSTAT) 0.4 MG SL tablet Place 1 tablet (0.4 mg total) under the tongue every 5 (five) minutes x 3 doses as needed for chest pain. 03/15/15  Yes [name], MD  omeprazole (PRILOSEC) 20 MG capsule Take 20 mg by mouth 2 (two) times daily.   Yes Historical Provider, MD  rivaroxaban (XARELTO) 20 MG TABS tablet Take 1 tablet (20 mg total) by mouth daily with supper. 03/15/15  Yes [name], MD  tamsulosin [facility]) 0.4 MG CAPS capsule take 1 capsule by mouth once daily after SUPPER 06/24/15  Yes Hoyt Koch, MD  tiotropium (SPIRIVA) 18 MCG inhalation capsule Place 1 capsule (18 mcg total) into inhaler and inhale daily. 04/17/15  Yes Juanito Doom, MD   BP 108/74 mmHg  Pulse 59  Temp(Src) 98.7 F (37.1 C) (Oral)  Resp 21  SpO2 95% Physical Exam  Constitutional: He is oriented to person, place, and time. He appears well-developed and well-nourished.  HENT:  Head: Normocephalic and atraumatic.  Eyes: Conjunctivae and EOM are normal. Pupils are equal, round, and reactive to light.  Neck: Normal range of motion.  Cardiovascular: Normal rate and normal heart sounds.   Pulmonary/Chest: Effort normal and breath sounds normal. He exhibits tenderness.  Abdominal: Soft. He exhibits no distension.   Musculoskeletal: Normal range of motion.  Neurological: He is alert and oriented to person, place, and time.  Skin: Skin is warm.  Psychiatric: He has a normal mood and affect.  Nursing note and vitals reviewed.   ED Course  Procedures (including critical care time) Labs Review Labs Reviewed  BASIC METABOLIC PANEL - Abnormal; Notable for the following:    Creatinine, Ser 1.30 (*)    GFR calc non Af Amer 52 (*)    GFR calc Af Amer 60 (*)    All other components within normal limits  CBC  I-STAT TROPOININ, ED    Imaging Review Dg Chest 2 View  07/26/2015  CLINICAL DATA:  75 year old male with history of cough, congestion, shortness of breath and fever for several days. EXAM: CHEST  2 VIEW COMPARISON:  Chest x-ray 07/01/2015. FINDINGS: Lungs appear hyperexpanded with flattening of the hemidiaphragms, mild diffuse peribronchial cuffing, increased retrosternal air space and pruning of the pulmonary vasculature in the periphery, suggestive of underlying COPD. No acute consolidative airspace disease. No pleural effusions. No evidence of pulmonary edema. No pneumothorax. No suspicious appearing pulmonary nodules or masses. Heart size is normal. Upper mediastinal contours are within normal limits. Atherosclerosis in the thoracic aorta. Left-sided pacemaker/AICD with lead tips projecting over the expected location of the right atrium and right ventricular apex. IMPRESSION: 1. Chronic changes suggestive of COPD, as above. No radiographic evidence of acute cardiopulmonary disease. 2. Atherosclerosis. Electronically Signed   By: [name] M.D.   On: 07/26/2015 22:26   I have personally reviewed and evaluated these images and lab results as part of my medical decision-making.   EKG Interpretation None      MDM Pt given albuterol and atrovent neb.   Solumedrol 125mg  Iv.  Chest xray shows no evidence of pneumonia.     Final diagnoses:  COPD (chronic obstructive pulmonary disease) with  chronic bronchitis (HCC)    Prednisone taper Zithromax See your Physicain for recheck on Monday.  Return if any problems.    Fransico Meadow, PA-C 07/27/15 0009

## 2015-07-26 NOTE — ED Notes (Addendum)
Pt thinks he may have bronchitis.  States he always has a cough but cough has been worse today with increased sob.  States he was initially coughing clear sputum this morning but cough is now dry.  Pt hypotensive at triage.

## 2015-07-27 MED ORDER — AMOXICILLIN-POT CLAVULANATE 875-125 MG PO TABS: 1.0000 | ORAL_TABLET | Freq: Two times a day (BID) | ORAL | Status: DC

## 2015-07-27 MED ORDER — PREDNISONE 10 MG PO TABS: ORAL_TABLET | ORAL | Status: DC

## 2015-07-27 NOTE — Discharge Instructions (Signed)
Chronic Bronchitis Chronic bronchitis is a lasting inflammation of the bronchial tubes, which are the tubes that carry air into your lungs. This is inflammation that occurs:   On most days of the week.   For at least three months at a time.   Over a period of two years in a row. When the bronchial tubes are inflamed, they start to produce mucus. The inflammation and buildup of mucus make it more difficult to breathe. Chronic bronchitis is usually a permanent problem and is one type of chronic obstructive pulmonary disease (COPD). People with chronic bronchitis are at greater risk for getting repeated colds, or respiratory infections. CAUSES  Chronic bronchitis most often occurs in people who have:  Long-standing, severe asthma.  A history of smoking.  Asthma and who also smoke. SIGNS AND SYMPTOMS  Chronic bronchitis may cause the following:   A cough that brings up mucus (productive cough).  Shortness of breath.  Early morning headache.  Wheezing.  Chest discomfort.   Recurring respiratory infections. DIAGNOSIS  Your health care provider may confirm the diagnosis by:  Taking your medical history.  Performing a physical exam.  Taking a chest X-ray.   Performing pulmonary function tests. TREATMENT  Treatment involves controlling symptoms with medicines, oxygen therapy, or making lifestyle changes, such as exercising and eating a healthy, well-balanced diet. Medicines could include:  Inhalers to improve air flow in and out of your lungs.  Antibiotics to treat bacterial infections, such as pneumonia, sinus infections, and acute bronchitis. As a preventative measure, your health care provider may recommend routine vaccinations for influenza and pneumonia. This is to prevent infection and hospitalization since you may be more at risk for these types of infections.  HOME CARE INSTRUCTIONS  Take medicines only as directed by your health care provider.   If you smoke  cigarettes, chew tobacco, or use electronic cigarettes, quit. If you need help quitting, ask your health care provider.  Avoid pollen, dust, animal dander, molds, smoke, and other things that cause shortness of breath or wheezing attacks.  Talk to your health care provider about possible exercise routines. Regular exercise is very important to help you feel better.  If you are prescribed oxygen use at home follow these guidelines:  Never smoke while using oxygen. Oxygen does not burn or explode, but flammable materials will burn faster in the presence of oxygen.  Keep a fire extinguisher close by. Let your fire department know that you have oxygen in your home.  Warn visitors not to smoke near you when you are using oxygen. Put up "no smoking" signs in your home where you most often use the oxygen.  Regularly test your smoke detectors at home to make sure they work. If you receive care in your home from a nurse or other health care provider, he or she may also check to make sure your smoke detectors work.  Ask your health care provider whether you would benefit from a pulmonary rehabilitation program.  Do not wait to get medical care if you have any concerning symptoms. Delays could cause permanent injury and may be life threatening. SEEK MEDICAL CARE IF:  You have increased coughing or shortness of breath or both.  You have muscle aches.  You have chest pain.  Your mucus gets thicker.  Your mucus changes from clear or white to yellow, green, gray, or bloody. SEEK IMMEDIATE MEDICAL CARE IF:  Your usual medicines do not stop your wheezing.   You have increased difficulty breathing.     You have any problems with the medicine you are taking, such as a rash, itching, swelling, or trouble breathing. MAKE SURE YOU:   Understand these instructions.  Will watch your condition.  Will get help right away if you are not doing well or get worse.   This information is not intended to  replace advice given to you by your health care provider. Make sure you discuss any questions you have with your health care provider.   Document Released: 01/01/2006 Document Revised: 04/06/2014 Document Reviewed: 04/24/2013 Elsevier Interactive Patient Education 2016 Elsevier Inc.  

## 2015-07-29 ENCOUNTER — Encounter: Payer: Self-pay | Admitting: Adult Health

## 2015-07-29 ENCOUNTER — Telehealth: Payer: Self-pay | Admitting: Pulmonary Disease

## 2015-07-29 ENCOUNTER — Ambulatory Visit (INDEPENDENT_AMBULATORY_CARE_PROVIDER_SITE_OTHER): Payer: Medicare Other | Admitting: Adult Health

## 2015-07-29 VITALS — BP 118/82 | HR 60 | Temp 96.6°F | Ht 71.0 in | Wt 217.0 lb

## 2015-07-29 DIAGNOSIS — I25810 Atherosclerosis of coronary artery bypass graft(s) without angina pectoris: Secondary | ICD-10-CM | POA: Diagnosis not present

## 2015-07-29 DIAGNOSIS — J449 Chronic obstructive pulmonary disease, unspecified: Secondary | ICD-10-CM

## 2015-07-29 LAB — CULTURE, GROUP A STREP (THRC)

## 2015-07-29 MED ORDER — LEVALBUTEROL HCL 0.63 MG/3ML IN NEBU
0.6300 mg | INHALATION_SOLUTION | Freq: Once | RESPIRATORY_TRACT | Status: AC
  Administered 2015-07-29: 0.63 mg via RESPIRATORY_TRACT

## 2015-07-29 NOTE — Assessment & Plan Note (Signed)
Slow to resolve exacerbation in smoker  Smoking cessation  ER workup unrevealing.  ? ACE inhibitor making cough worse.   Plan  Finish Augmentin and Prednisone as directed.  Mucinex DM Twice daily  As needed  Cough/congestion  Saline nasal rinses As needed   Zyrtec 10mg  At bedtime  As needed drainage.  Discuss with cardiology regarding Benezapril that may make your cough worse.  Follow up with Dr. Lake Bells in 6 weeks and As needed   Please contact office for sooner follow up if symptoms do not improve or worsen or seek emergency care

## 2015-07-29 NOTE — Telephone Encounter (Signed)
Called, spoke with pt.  Pt was seen in ED on Friday and was given pred taper and augmentin.  Pt states symptoms worsened last night and had to call EMS.  States EKG was done by EMS, o2 sat was 95% and breathing tx given.  Pt states symptoms did improve some with breathing treatment but still having SOB - worse when laying laying and coughing, cough - mostly nonprod, abd "fullness," chest fullness, and some wheezing.  No f/c/s.  Pt is using combivent with short period of relief but relief doesn't last as long as it normally does.  Pt able to complete sentences over the phone.  Advised to keep pending appt this am with TP and to seek emergency care if needed or symptoms worsen prior to OV.  Pt verbalized understanding and is in agreement with plan.

## 2015-07-29 NOTE — Addendum Note (Signed)
Addended by: Osa Craver on: 07/29/2015 11:52 AM   Modules accepted: Orders

## 2015-07-29 NOTE — Progress Notes (Signed)
Subjective:    Patient ID: Steven Guzman, male    DOB: 1940/06/19, 75 y.o.   MRN: [id number]  HPI 75 yo male smoker with GOLD C COPD   TEST  10/2013 PFT> ratio 61%, FEV1 2.53L (77% pred, 15% change), TLC 8.65 L (117% pred), RV 4.25L (161% pred), DLCO 25.39 (73% pred)   07/29/2015 Acute OV  Pt presents for an acute office visit. Complains of  5 days of  SOB with activity, wheezing at times, prod cought at times with a small amount of white mucus, sinus pressure/drainage, chest congestion/tightness. Denies any fever, nausea or vomiting. Went to ER on 07/26/15 , tx w/ Augmentin and Prednisone taper  And Steroid shot. Has few days left of Abx and steroids. Just started prednisone yesterday.  Says he still feels bad, hard to get up any mucus. Cough is really bad.  No hemoptysis .  Remains on Symbicort and Spiriva .  Still smoking, discussed on cessaiton.  CXR in ER showed COPD changes.  Strep test neg. Troponin neg. WBC nml .  Called  EMS yesterday bc he felt he was not getting better and more wheezing .  On Amiodarone  And Ace Inhibitor . Discussed ACE Inhibitor cough , he is resistant to changing . Will discuss with cardiology.  This morning feeling some better      Past Medical History  Diagnosis Date  . Hypertension   . Hyperlipidemia   . GERD (gastroesophageal reflux disease)   . Diverticulosis of colon 07/2014    noted on CT  . CAD (coronary artery disease)     a. s/p MI in 1994 with PCI to LAD at that time b. cath 10/2012 demonstrated EF 30%, inferior akinesis with mild hypokinesis of all walls, patent LAD and RCA stents; ostial PDA with 80-90% obstruction with medical therapy recommended   . Depression   . Aneurysm ([place])     abdominal <4. 3.6 cm on ultrasound 09/2014.   . Tobacco abuse   . Ventricular tachycardia ([place])     a. 08/2009 s/p BSX E110 Teligen 100 AICD, ser#: [id number];  b. 08/2008 VT req ATP - detection reprogrammed from 160 to 150. c. EPS and VT ablation by Dr. Lovena Le  12/21/2014  . Chronic systolic CHF (congestive heart failure) (HCC)     EF 30 to 35 % as of 09/2014.   [name] Hiatal hernia   . Prostate enlargement 07/2014    observed on CT  . Hyperglycemia 10/2012.  [name] AICD (automatic cardioverter/defibrillator) present   . Basal cell carcinoma of nose     S/P MOHS  . Complication of anesthesia 10/2014    "had to have defibrillator w/ERCP"  . Anginal pain ([place])   . Myocardial infarction [facility]) 1994; 2011  . COPD (chronic obstructive pulmonary disease) ([place])     a. followed by pulmonary, COPD GOLD stage II  . Pneumonia 1946; 2015  . Chronic bronchitis ([place])     "was having it q yr; haven't had it in a couple years" (12/21/2014)  . Anxiety    . Current Outpatient Prescriptions on File Prior to Visit  Medication Sig Dispense Refill  . amiodarone (PACERONE) 200 MG tablet Take 1 tablet (200 mg total) by mouth daily. Take one tablet by mouth in the morning 30 tablet 9  . amoxicillin-clavulanate (AUGMENTIN) 875-125 MG tablet Take 1 tablet by mouth 2 (two) times daily. 14 tablet 0  . atorvastatin (LIPITOR) 80 MG tablet Take 1 tablet (80 mg total)  by mouth daily. (Patient taking differently: Take 80 mg by mouth daily at 6 PM. ) 30 tablet 6  . benazepril (LOTENSIN) 10 MG tablet Take 1 tablet (10 mg total) by mouth daily. 90 tablet 3  . budesonide-formoterol (SYMBICORT) 160-4.5 MCG/ACT inhaler inhale 2 puffs by mouth every morning and 2 puffs every evening ; SEPARATE DOSES BY 12 HOURS 10.2 Inhaler 5  . busPIRone (BUSPAR) 15 MG tablet take 1/2 tablet by mouth twice a day 60 tablet 5  . carvedilol (COREG) 12.5 MG tablet Take 1 tablet (12.5 mg total) by mouth 2 (two) times daily with a meal. 180 tablet 3  . cetirizine (ZYRTEC) 10 MG tablet Take 10 mg by mouth daily as needed for allergies.    . Choline Fenofibrate (TRILIPIX) 135 MG capsule Take 1 capsule (135 mg total) by mouth daily. 30 capsule 6  . escitalopram (LEXAPRO) 20 MG tablet Take 1 tablet (20 mg total) by mouth  daily. 30 tablet 6  . furosemide (LASIX) 40 MG tablet Take 0.5 tablets (20 mg total) by mouth daily. 30 tablet 3  . gabapentin (NEURONTIN) 100 MG capsule take 3 capsules by mouth twice a day 540 capsule 2  . Ipratropium-Albuterol (COMBIVENT RESPIMAT) 20-100 MCG/ACT AERS respimat Inhale 1 puff into the lungs every 6 (six) hours. Shortness of breath or wheezing    . nitroGLYCERIN (NITROSTAT) 0.4 MG SL tablet Place 1 tablet (0.4 mg total) under the tongue every 5 (five) minutes x 3 doses as needed for chest pain. 25 tablet 11  . omeprazole (PRILOSEC) 20 MG capsule Take 20 mg by mouth 2 (two) times daily.    . predniSONE (DELTASONE) 10 MG tablet 5,4,3,2,1 taper 15 tablet 0  . rivaroxaban (XARELTO) 20 MG TABS tablet Take 1 tablet (20 mg total) by mouth daily with supper. 30 tablet 11  . tamsulosin (FLOMAX) 0.4 MG CAPS capsule take 1 capsule by mouth once daily after SUPPER 30 capsule 3  . tiotropium (SPIRIVA) 18 MCG inhalation capsule Place 1 capsule (18 mcg total) into inhaler and inhale daily. 30 capsule 0   No current facility-administered medications on file prior to visit.      Review of Systems Constitutional:   No  weight loss, night sweats,  Fevers, chills,  +fatigue, or  lassitude.  HEENT:   No headaches,  Difficulty swallowing,  Tooth/dental problems, or  Sore throat,                No sneezing, itching, ear ache, nasal congestion, post nasal drip,   CV:  No chest pain,  Orthopnea, PND, swelling in lower extremities, anasarca, dizziness, palpitations, syncope.   GI  No heartburn, indigestion, abdominal pain, nausea, vomiting, diarrhea, change in bowel habits, loss of appetite, bloody stools.   Resp:  No chest wall deformity  Skin: no rash or lesions.  GU: no dysuria, change in color of urine, no urgency or frequency.  No flank pain, no hematuria   MS:  No joint pain or swelling.  No decreased range of motion.  No back pain.  Psych:  No change in mood or affect. No depression or  anxiety.  No memory loss.         Objective:   Physical Exam . Filed Vitals:   07/29/15 1105  BP: 118/82  Pulse: 60  Temp: 96.6 F (35.9 C)  TempSrc: Oral  Height: 5\' 11"  (1.803 m)  Weight: 217 lb (98.431 kg)  SpO2: 93%   GEN: A/Ox3; pleasant , NAD ,  elderly   HEENT:  [place]/AT,  EACs-clear, TMs-wnl, NOSE-clear, THROAT-clear, no lesions, no postnasal drip or exudate noted.   NECK:  Supple w/ fair ROM; no JVD; normal carotid impulses w/o bruits; no thyromegaly or nodules palpated; no lymphadenopathy.  RESP  Few trace wheezing, no stridor , no accessory muscle use, no dullness to percussion  CARD:  RRR, no m/r/g  , no peripheral edema, pulses intact, no cyanosis or clubbing.  GI:   Soft & nt; nml bowel sounds; no organomegaly or masses detected.  Musco: Warm bil, no deformities or joint swelling noted.   Neuro: alert, no focal deficits noted.    Skin: Warm, no lesions or rashes  Saidy Ormand NP-C  [facility]  07/29/2015       Assessment & Plan:

## 2015-07-29 NOTE — Patient Instructions (Addendum)
Finish Augmentin and Prednisone as directed.  Mucinex DM Twice daily  As needed  Cough/congestion  Saline nasal rinses As needed   Zyrtec 10mg  At bedtime  As needed drainage.  Discuss with cardiology regarding Benezapril that may make your cough worse.  Follow up with Dr. Lake Bells in 6 weeks and As needed   Please contact office for sooner follow up if symptoms do not improve or worsen or seek emergency care

## 2015-07-30 DIAGNOSIS — R490 Dysphonia: Secondary | ICD-10-CM | POA: Diagnosis not present

## 2015-08-02 ENCOUNTER — Other Ambulatory Visit (INDEPENDENT_AMBULATORY_CARE_PROVIDER_SITE_OTHER): Payer: Medicare Other

## 2015-08-02 ENCOUNTER — Encounter: Payer: Self-pay | Admitting: Internal Medicine

## 2015-08-02 ENCOUNTER — Ambulatory Visit (INDEPENDENT_AMBULATORY_CARE_PROVIDER_SITE_OTHER): Payer: Medicare Other | Admitting: Internal Medicine

## 2015-08-02 VITALS — BP 90/60 | HR 60 | Wt 212.0 lb

## 2015-08-02 DIAGNOSIS — J449 Chronic obstructive pulmonary disease, unspecified: Secondary | ICD-10-CM | POA: Diagnosis not present

## 2015-08-02 DIAGNOSIS — I25810 Atherosclerosis of coronary artery bypass graft(s) without angina pectoris: Secondary | ICD-10-CM

## 2015-08-02 DIAGNOSIS — F17210 Nicotine dependence, cigarettes, uncomplicated: Secondary | ICD-10-CM

## 2015-08-02 DIAGNOSIS — K861 Other chronic pancreatitis: Secondary | ICD-10-CM | POA: Diagnosis not present

## 2015-08-02 DIAGNOSIS — Z720 Tobacco use: Secondary | ICD-10-CM | POA: Diagnosis not present

## 2015-08-02 LAB — HEPATIC FUNCTION PANEL
ALBUMIN: 4.1 g/dL (ref 3.5–5.2)
ALK PHOS: 38 U/L — AB (ref 39–117)
ALT: 33 U/L (ref 0–53)
AST: 50 U/L — AB (ref 0–37)
BILIRUBIN TOTAL: 1.1 mg/dL (ref 0.2–1.2)
Bilirubin, Direct: 0.3 mg/dL (ref 0.0–0.3)
Total Protein: 6.8 g/dL (ref 6.0–8.3)

## 2015-08-02 LAB — CBC WITH DIFFERENTIAL/PLATELET
BASOS ABS: 0 10*3/uL (ref 0.0–0.1)
Basophils Relative: 0.3 % (ref 0.0–3.0)
Eosinophils Absolute: 0.1 10*3/uL (ref 0.0–0.7)
Eosinophils Relative: 0.8 % (ref 0.0–5.0)
HCT: 44.2 % (ref 39.0–52.0)
HEMOGLOBIN: 14.7 g/dL (ref 13.0–17.0)
LYMPHS ABS: 1.6 10*3/uL (ref 0.7–4.0)
Lymphocytes Relative: 12.1 % (ref 12.0–46.0)
MCHC: 33.3 g/dL (ref 30.0–36.0)
MCV: 94.7 fl (ref 78.0–100.0)
MONOS PCT: 6.7 % (ref 3.0–12.0)
Monocytes Absolute: 0.9 10*3/uL (ref 0.1–1.0)
NEUTROS PCT: 80.1 % — AB (ref 43.0–77.0)
Neutro Abs: 10.7 10*3/uL — ABNORMAL HIGH (ref 1.4–7.7)
Platelets: 220 10*3/uL (ref 150.0–400.0)
RBC: 4.67 Mil/uL (ref 4.22–5.81)
RDW: 15.3 % (ref 11.5–15.5)
WBC: 13.4 10*3/uL — AB (ref 4.0–10.5)

## 2015-08-02 LAB — SEDIMENTATION RATE: Sed Rate: 10 mm/hr (ref 0–22)

## 2015-08-02 LAB — BASIC METABOLIC PANEL
BUN: 13 mg/dL (ref 6–23)
CALCIUM: 9.5 mg/dL (ref 8.4–10.5)
CO2: 30 mEq/L (ref 19–32)
Chloride: 99 mEq/L (ref 96–112)
Creatinine, Ser: 1.17 mg/dL (ref 0.40–1.50)
GFR: 64.3 mL/min (ref >60.00)
GLUCOSE: 103 mg/dL — AB (ref 70–99)
Potassium: 4.1 mEq/L (ref 3.5–5.1)
SODIUM: 137 meq/L (ref 135–145)

## 2015-08-02 LAB — LIPASE: Lipase: 27 U/L (ref 11.0–59.0)

## 2015-08-02 MED ORDER — PANCRELIPASE (LIP-PROT-AMYL) 24000-76000 UNITS PO CPEP: ORAL_CAPSULE | ORAL | Status: DC

## 2015-08-02 MED ORDER — PROMETHAZINE-CODEINE 6.25-10 MG/5ML PO SYRP: 5.0000 mL | ORAL_SOLUTION | ORAL | Status: DC | PRN

## 2015-08-02 MED ORDER — PREDNISONE 10 MG PO TABS: ORAL_TABLET | ORAL | Status: DC

## 2015-08-02 NOTE — Progress Notes (Signed)
Subjective:  Patient ID: Steven Guzman, male    DOB: 1940/05/03  Age: 75 y.o. MRN: [id number]  CC: Shortness of Breath; Abdominal Pain; and Bloated   HPI JAMARIE BIAN presents for abd bloating that is new, it has been interfering w/his breathing. Abd pain is 2-3 out of 10. It was 6/10 last night. H/o pancreatitis last year. C/o umbilical hernia. C/o COPD - on Pred/Augmentin... No ETOH  He is a smoker  Outpatient Prescriptions Prior to Visit  Medication Sig Dispense Refill  . amiodarone (PACERONE) 200 MG tablet Take 1 tablet (200 mg total) by mouth daily. Take one tablet by mouth in the morning 30 tablet 9  . amoxicillin-clavulanate (AUGMENTIN) 875-125 MG tablet Take 1 tablet by mouth 2 (two) times daily. 14 tablet 0  . atorvastatin (LIPITOR) 80 MG tablet Take 1 tablet (80 mg total) by mouth daily. (Patient taking differently: Take 80 mg by mouth daily at 6 PM. ) 30 tablet 6  . benazepril (LOTENSIN) 10 MG tablet Take 1 tablet (10 mg total) by mouth daily. 90 tablet 3  . budesonide-formoterol (SYMBICORT) 160-4.5 MCG/ACT inhaler inhale 2 puffs by mouth every morning and 2 puffs every evening ; SEPARATE DOSES BY 12 HOURS 10.2 Inhaler 5  . busPIRone (BUSPAR) 15 MG tablet take 1/2 tablet by mouth twice a day 60 tablet 5  . carvedilol (COREG) 12.5 MG tablet Take 1 tablet (12.5 mg total) by mouth 2 (two) times daily with a meal. 180 tablet 3  . cetirizine (ZYRTEC) 10 MG tablet Take 10 mg by mouth daily as needed for allergies.    . Choline Fenofibrate (TRILIPIX) 135 MG capsule Take 1 capsule (135 mg total) by mouth daily. 30 capsule 6  . escitalopram (LEXAPRO) 20 MG tablet Take 1 tablet (20 mg total) by mouth daily. 30 tablet 6  . furosemide (LASIX) 40 MG tablet Take 0.5 tablets (20 mg total) by mouth daily. 30 tablet 3  . gabapentin (NEURONTIN) 100 MG capsule take 3 capsules by mouth twice a day 540 capsule 2  . Ipratropium-Albuterol (COMBIVENT RESPIMAT) 20-100 MCG/ACT AERS respimat Inhale  1 puff into the lungs every 6 (six) hours. Shortness of breath or wheezing    . nitroGLYCERIN (NITROSTAT) 0.4 MG SL tablet Place 1 tablet (0.4 mg total) under the tongue every 5 (five) minutes x 3 doses as needed for chest pain. 25 tablet 11  . omeprazole (PRILOSEC) 20 MG capsule Take 20 mg by mouth 2 (two) times daily.    . rivaroxaban (XARELTO) 20 MG TABS tablet Take 1 tablet (20 mg total) by mouth daily with supper. 30 tablet 11  . tamsulosin (FLOMAX) 0.4 MG CAPS capsule take 1 capsule by mouth once daily after SUPPER 30 capsule 3  . tiotropium (SPIRIVA) 18 MCG inhalation capsule Place 1 capsule (18 mcg total) into inhaler and inhale daily. 30 capsule 0  . predniSONE (DELTASONE) 10 MG tablet 5,4,3,2,1 taper (Patient not taking: Reported on 08/02/2015) 15 tablet 0   No facility-administered medications prior to visit.    ROS Review of Systems  Constitutional: Negative for appetite change, fatigue and unexpected weight change.  HENT: Negative for congestion, nosebleeds, sneezing, sore throat and trouble swallowing.   Eyes: Negative for itching and visual disturbance.  Respiratory: Positive for cough, shortness of breath and wheezing.   Cardiovascular: Negative for chest pain, palpitations and leg swelling.  Gastrointestinal: Positive for vomiting, abdominal pain and abdominal distention. Negative for nausea, diarrhea, blood in stool and anal bleeding.  Genitourinary: Negative for frequency and hematuria.  Musculoskeletal: Negative for back pain, joint swelling, gait problem and neck pain.  Skin: Negative for rash.  Neurological: Negative for dizziness, tremors, speech difficulty and weakness.  Psychiatric/Behavioral: Negative for suicidal ideas, sleep disturbance, dysphoric mood and agitation. The patient is not nervous/anxious.     Objective:  BP 90/60 mmHg  Pulse 60  Wt 212 lb (96.163 kg)  SpO2 92%  BP Readings from Last 3 Encounters:  08/02/15 90/60  07/29/15 118/82  07/26/15  105/71    Wt Readings from Last 3 Encounters:  08/02/15 212 lb (96.163 kg)  07/29/15 217 lb (98.431 kg)  07/01/15 211 lb 9.6 oz (95.981 kg)    Physical Exam  Constitutional: He is oriented to person, place, and time. He appears well-developed. No distress.  NAD  HENT:  Mouth/Throat: Oropharynx is clear and moist.  Eyes: Conjunctivae are normal. Pupils are equal, round, and reactive to light.  Neck: Normal range of motion. No JVD present. No thyromegaly present.  Cardiovascular: Normal rate, regular rhythm, normal heart sounds and intact distal pulses.  Exam reveals no gallop and no friction rub.   No murmur heard. Pulmonary/Chest: Effort normal. No respiratory distress. He has wheezes. He has no rales. He exhibits no tenderness.  Abdominal: Soft. Bowel sounds are normal. He exhibits distension and mass. There is tenderness. There is no rebound and no guarding.  Musculoskeletal: Normal range of motion. He exhibits no edema or tenderness.  Lymphadenopathy:    He has no cervical adenopathy.  Neurological: He is alert and oriented to person, place, and time. He has normal reflexes. No cranial nerve deficit. He exhibits normal muscle tone. He displays a negative Romberg sign. Coordination and gait normal.  Skin: Skin is warm and dry. No rash noted.  Psychiatric: He has a normal mood and affect. His behavior is normal. Judgment and thought content normal.  distended abd Tender in epig umb hernia  Lab Results  Component Value Date   WBC 8.0 07/26/2015   HGB 13.7 07/26/2015   HCT 41.3 07/26/2015   PLT 216 07/26/2015   GLUCOSE 92 07/26/2015   CHOL 150 11/25/2012   TRIG 98 11/25/2012   HDL 43 11/25/2012   LDLCALC 87 11/25/2012   ALT 16 07/01/2015   AST 14 07/01/2015   NA 139 07/26/2015   K 4.2 07/26/2015   CL 107 07/26/2015   CREATININE 1.30* 07/26/2015   BUN 10 07/26/2015   CO2 22 07/26/2015   TSH 1.85 07/01/2015   INR 1.07 09/08/2014   HGBA1C 5.5 11/25/2012    Dg Chest 2  View  07/26/2015  CLINICAL DATA:  75 year old male with history of cough, congestion, shortness of breath and fever for several days. EXAM: CHEST  2 VIEW COMPARISON:  Chest x-ray 07/01/2015. FINDINGS: Lungs appear hyperexpanded with flattening of the hemidiaphragms, mild diffuse peribronchial cuffing, increased retrosternal air space and pruning of the pulmonary vasculature in the periphery, suggestive of underlying COPD. No acute consolidative airspace disease. No pleural effusions. No evidence of pulmonary edema. No pneumothorax. No suspicious appearing pulmonary nodules or masses. Heart size is normal. Upper mediastinal contours are within normal limits. Atherosclerosis in the thoracic aorta. Left-sided pacemaker/AICD with lead tips projecting over the expected location of the right atrium and right ventricular apex. IMPRESSION: 1. Chronic changes suggestive of COPD, as above. No radiographic evidence of acute cardiopulmonary disease. 2. Atherosclerosis. Electronically Signed   By: [name] M.D.   On: 07/26/2015 22:26  Assessment & Plan:   There are no diagnoses linked to this encounter. I am having Mr. Olbera maintain his omeprazole, cetirizine, benazepril, carvedilol, gabapentin, furosemide, atorvastatin, Choline Fenofibrate, Ipratropium-Albuterol, nitroGLYCERIN, rivaroxaban, tiotropium, busPIRone, escitalopram, tamsulosin, amiodarone, budesonide-formoterol, predniSONE, and amoxicillin-clavulanate.  No orders of the defined types were placed in this encounter.     Follow-up: No Follow-up on file.  Walker Kehr, MD

## 2015-08-02 NOTE — Assessment & Plan Note (Signed)
Creon Rx Small meals

## 2015-08-02 NOTE — Assessment & Plan Note (Signed)
Prednisone 10 mg: take 4 tabs a day x 3 days; then 3 tabs a day x 4 days; then 2 tabs a day x 4 days, then 1 tab a day x 6 days, then stop. Take pc. Use MDIs

## 2015-08-02 NOTE — Progress Notes (Signed)
Pre visit review using our clinic review tool, if applicable. No additional management support is needed unless otherwise documented below in the visit note. 

## 2015-08-02 NOTE — Assessment & Plan Note (Signed)
Discussed.

## 2015-08-04 ENCOUNTER — Encounter: Payer: Self-pay | Admitting: Internal Medicine

## 2015-08-05 ENCOUNTER — Ambulatory Visit: Payer: Medicare Other | Admitting: Internal Medicine

## 2015-08-07 ENCOUNTER — Ambulatory Visit: Payer: Medicare Other | Admitting: Internal Medicine

## 2015-08-16 ENCOUNTER — Encounter: Payer: Self-pay | Admitting: Internal Medicine

## 2015-08-16 ENCOUNTER — Ambulatory Visit (INDEPENDENT_AMBULATORY_CARE_PROVIDER_SITE_OTHER): Payer: Medicare Other | Admitting: Internal Medicine

## 2015-08-16 VITALS — BP 112/66 | HR 59 | Temp 98.5°F | Resp 16 | Ht 71.0 in | Wt 211.0 lb

## 2015-08-16 DIAGNOSIS — R220 Localized swelling, mass and lump, head: Secondary | ICD-10-CM

## 2015-08-16 DIAGNOSIS — I25810 Atherosclerosis of coronary artery bypass graft(s) without angina pectoris: Secondary | ICD-10-CM

## 2015-08-16 DIAGNOSIS — I10 Essential (primary) hypertension: Secondary | ICD-10-CM | POA: Diagnosis not present

## 2015-08-16 DIAGNOSIS — K861 Other chronic pancreatitis: Secondary | ICD-10-CM

## 2015-08-16 DIAGNOSIS — R221 Localized swelling, mass and lump, neck: Secondary | ICD-10-CM

## 2015-08-16 DIAGNOSIS — J392 Other diseases of pharynx: Secondary | ICD-10-CM

## 2015-08-16 NOTE — Progress Notes (Signed)
   Subjective:    Patient ID: Steven Guzman, male    DOB: [birth year], 76 y.o.   MRN: [id number]  HPI The patient is a 75 YO man coming in for follow up of his blood pressure. He is doing well with his meds. He is still taking his amiodarone, benazepril, coreg, lasix, flomax and not having any side effects. He has been taking some creon since last visit when he was having stomach pain which has helped some. He was having bloating. No nausea or vomiting. No diarrhea. He does have a visit with GI soon and needs colonoscopy and is hoping that they will be able to find the problem. He also has gallstones which likely need removal and he is hoping since his v fib ablation was successful that he may now be a surgical candidate.   Review of Systems  Constitutional: Negative for fever, activity change, appetite change and unexpected weight change.  Respiratory: Positive for shortness of breath. Negative for cough, chest tightness and wheezing.        Stable  Cardiovascular: Negative for chest pain, palpitations and leg swelling.  Gastrointestinal: Positive for abdominal distention. Negative for diarrhea and constipation.  Musculoskeletal: Positive for arthralgias.  Neurological: Negative for dizziness, tremors, light-headedness, numbness and headaches.      Objective:   Physical Exam  Constitutional: He is oriented to person, place, and time. He appears well-developed and well-nourished.  HENT:  Head: Normocephalic and atraumatic.  Eyes: EOM are normal.  Neck: Normal range of motion.  Cardiovascular: Normal rate.   Pulmonary/Chest: Effort normal and breath sounds normal.  Abdominal: Soft. Bowel sounds are normal. He exhibits no distension. There is no tenderness. There is no rebound.  No distention or pain on exam today  Musculoskeletal: He exhibits no edema.  Neurological: He is alert and oriented to person, place, and time.  Skin: Skin is warm and dry.   Filed Vitals:   08/16/15 1301    BP: 112/66  Pulse: 59  Temp: 98.5 F (36.9 C)  TempSrc: Oral  Resp: 16  Height: 5\' 11"  (1.803 m)  Weight: 211 lb (95.709 kg)  SpO2: 93%      Assessment & Plan:

## 2015-08-16 NOTE — Patient Instructions (Signed)
We are not changing the medicines today and do not need any blood work today.

## 2015-08-16 NOTE — Progress Notes (Signed)
Pre visit review using our clinic review tool, if applicable. No additional management support is needed unless otherwise documented below in the visit note. 

## 2015-08-18 NOTE — Assessment & Plan Note (Signed)
Taking significant regimen and BP is at goal, no symptoms of over treatment such as dizziness. Will not adjust today and continue coreg, lasix, flomax, amiodarone, benazepril.

## 2015-08-18 NOTE — Assessment & Plan Note (Signed)
Is taking creon and doing better with symptoms. Since he is now out since the gallstone induced pancreatitis it may be worthwhile to see if he is a surgical candidate to avoid future events.

## 2015-08-18 NOTE — Assessment & Plan Note (Signed)
Has seen ENT and they are monitoring. No growth since initial finding.

## 2015-08-19 ENCOUNTER — Other Ambulatory Visit: Payer: Self-pay | Admitting: Pulmonary Disease

## 2015-08-19 MED ORDER — TIOTROPIUM BROMIDE MONOHYDRATE 18 MCG IN CAPS: 18.0000 ug | ORAL_CAPSULE | Freq: Every day | RESPIRATORY_TRACT | Status: DC

## 2015-08-19 MED ORDER — BUDESONIDE-FORMOTEROL FUMARATE 160-4.5 MCG/ACT IN AERO: INHALATION_SPRAY | RESPIRATORY_TRACT | Status: DC

## 2015-08-19 NOTE — Telephone Encounter (Signed)
Pt just seen 5.1.17 by TP: Patient Instructions       Finish Augmentin and Prednisone as directed.   Mucinex DM Twice daily  As needed  Cough/congestion   Saline nasal rinses As needed    Zyrtec 10mg  At bedtime  As needed drainage.   Discuss with cardiology regarding Benezapril that may make your cough worse.   Follow up with Dr. Lake Bells in 6 weeks and As needed    Please contact office for sooner follow up if symptoms do not improve or worsen or seek emergency care    Pt has upcoming appt with BQ on 6.12.17 Since pt left pharmacy and names of medications requests + he is out of medication, will go ahead and refill meds  Called spoke with patient and informed him of the above Nothing further needed; will sign off

## 2015-08-19 NOTE — Telephone Encounter (Signed)
Attempted to contact patient, Left message for patient to call back.  

## 2015-09-09 ENCOUNTER — Encounter: Payer: Self-pay | Admitting: Pulmonary Disease

## 2015-09-09 ENCOUNTER — Ambulatory Visit (INDEPENDENT_AMBULATORY_CARE_PROVIDER_SITE_OTHER): Payer: Medicare Other | Admitting: Pulmonary Disease

## 2015-09-09 VITALS — BP 116/62 | HR 60 | Ht 71.0 in | Wt 212.2 lb

## 2015-09-09 DIAGNOSIS — F17210 Nicotine dependence, cigarettes, uncomplicated: Secondary | ICD-10-CM | POA: Diagnosis not present

## 2015-09-09 DIAGNOSIS — J449 Chronic obstructive pulmonary disease, unspecified: Secondary | ICD-10-CM

## 2015-09-09 DIAGNOSIS — Z720 Tobacco use: Secondary | ICD-10-CM | POA: Diagnosis not present

## 2015-09-09 DIAGNOSIS — I25810 Atherosclerosis of coronary artery bypass graft(s) without angina pectoris: Secondary | ICD-10-CM

## 2015-09-09 NOTE — Assessment & Plan Note (Addendum)
He is not taking smoking cessation very seriously. Apparently he continued to smoke on the day that he was supposed to quit when he and his wife signed up for smoking cessation classes.  He is well aware of all the risks of tobacco use and it sounds as if the desire to quit is just not there. I offered medical and further counseling support but at this time he did not request either.  He is due for another lung cancer screening CT this July.

## 2015-09-09 NOTE — Progress Notes (Signed)
Subjective:    Patient ID: Steven Guzman, male    DOB: 11/13/1940, 75 y.o.   MRN: [id number]  Synopsis: GOLD Grade C COPD, still actively smoking as of November 2015 10/2013 PFT> ratio 61%, FEV1 2.53L (77% pred, 15% change), TLC 8.65 L (117% pred), RV 4.25L (161% pred), DLCO 25.39 (73% pred)  HPI Chief Complaint  Patient presents with  . Follow-up    pt c/o sob with exertion, ches ttightness and "suffocation" feeling in hot weather- states he has good and bad days.    Steven Guzman says that he continued to have the cough after he saw Tammy in our clinic the last time.  He said that he was started on some pancreatic enzymes and was also given a long prednisone taper (at least 2 weeks) which helped significantly with his breathing and cough.  Since taking the prednisone he has some problems with dyspnea and cough which but it is "manageable" and not worsening.  He notes that he had not been using his combivent much.  He started using that again and he said that this helped a lot.  He tells me that he takes the Symbicort and Spiriva regularly, but he is using the combivent more frequently which is helping.  He is still smoking cigarettes.  Leaning over or picking up a heavy weight still makes him.   Past Medical History  Diagnosis Date  . Hypertension   . Hyperlipidemia   . GERD (gastroesophageal reflux disease)   . Diverticulosis of colon 07/2014    noted on CT  . CAD (coronary artery disease)     a. s/p MI in 1994 with PCI to LAD at that time b. cath 10/2012 demonstrated EF 30%, inferior akinesis with mild hypokinesis of all walls, patent LAD and RCA stents; ostial PDA with 80-90% obstruction with medical therapy recommended   . Depression   . Aneurysm ([place])     abdominal <4. 3.6 cm on ultrasound 09/2014.   . Tobacco abuse   . Ventricular tachycardia ([place])     a. 08/2009 s/p BSX E110 Teligen 100 AICD, ser#: [id number];  b. 08/2008 VT req ATP - detection reprogrammed from 160 to 150. c. EPS  and VT ablation by Dr. Lovena Le 12/21/2014  . Chronic systolic CHF (congestive heart failure) ([facility])     EF 30 to 35 % as of 09/2014.   [name] Hiatal hernia   . Prostate enlargement 07/2014    observed on CT  . Hyperglycemia 10/2012.  [name] AICD (automatic cardioverter/defibrillator) present   . Basal cell carcinoma of nose     S/P MOHS  . Complication of anesthesia 10/2014    "had to have defibrillator w/ERCP"  . Anginal pain ([place])   . Myocardial infarction [facility]) 1994; 2011  . COPD (chronic obstructive pulmonary disease) ([place])     a. followed by pulmonary, COPD GOLD stage II  . Pneumonia 1946; 2015  . Chronic bronchitis ([place])     "was having it q yr; haven't had it in a couple years" (12/21/2014)  . Anxiety   . Biliary acute pancreatitis      Review of Systems  Constitutional: Negative for fever, chills and fatigue.  HENT: Negative for postnasal drip, rhinorrhea and sinus pressure.   Respiratory: Negative for cough, shortness of breath and wheezing.   Cardiovascular: Negative for chest pain, palpitations and leg swelling.       Objective:   Physical Exam Filed Vitals:   09/09/15 1412  BP: 116/62  Pulse: 60  Height: 5\' 11"  (1.803 m)  Weight: 212 lb 3.2 oz (96.253 kg)  SpO2: 95%   RA  Gen: well appearing, no acute distress HEENT: NCAT, EOMi, OP clear PULM: Some wheezing today bilaterally, good air movement CV: RRR, no mgr, no JVD AB: BS+, soft, nontender Ext: warm, no edema, no clubbing, no cyanosis Derm: no rash or skin breakdown Neuro: A&Ox4, MAEW      Assessment & Plan:   COPD GOLD GRADE C He has moderate airflow obstruction but fairly severe disease because of recurrent exacerbations throughout the year. This is due solely to the fact that he continues to smoke cigarettes. See below.  Today we reviewed the concept of controller versus rescue medications. It sounds as if he is fairly compliant with his controller medicines. I am okay with him using Combivent as needed up  to 4 times a day. We reviewed the basic indications for these today.  Plan: Continue Symbicort Continue Spiriva Continue Combivent as needed Follow-up 4 months  Cigarette smoker He is not taking smoking cessation very seriously. Apparently he continued to smoke on the day that he was supposed to quit when he and his wife signed up for smoking cessation classes.  He is well aware of all the risks of tobacco use and it sounds as if the desire to quit is just not there. I offered medical and further counseling support but at this time he did not request either.  He is due for another lung cancer screening CT this July.    Updated Medication List Outpatient Encounter Prescriptions as of 09/09/2015  Medication Sig  . amiodarone (PACERONE) 200 MG tablet Take 1 tablet (200 mg total) by mouth daily. Take one tablet by mouth in the morning  . atorvastatin (LIPITOR) 80 MG tablet Take 1 tablet (80 mg total) by mouth daily. (Patient taking differently: Take 80 mg by mouth daily at 6 PM. )  . benazepril (LOTENSIN) 10 MG tablet Take 1 tablet (10 mg total) by mouth daily.  . budesonide-formoterol (SYMBICORT) 160-4.5 MCG/ACT inhaler inhale 2 puffs by mouth every morning and 2 puffs every evening ; SEPARATE DOSES BY 12 HOURS  . busPIRone (BUSPAR) 15 MG tablet take 1/2 tablet by mouth twice a day  . carvedilol (COREG) 12.5 MG tablet Take 1 tablet (12.5 mg total) by mouth 2 (two) times daily with a meal.  . cetirizine (ZYRTEC) 10 MG tablet Take 10 mg by mouth daily as needed for allergies.  . Choline Fenofibrate (TRILIPIX) 135 MG capsule Take 1 capsule (135 mg total) by mouth daily.  [name] escitalopram (LEXAPRO) 20 MG tablet Take 1 tablet (20 mg total) by mouth daily.  . furosemide (LASIX) 40 MG tablet Take 0.5 tablets (20 mg total) by mouth daily.  [name] gabapentin (NEURONTIN) 100 MG capsule take 3 capsules by mouth twice a day  . Ipratropium-Albuterol (COMBIVENT RESPIMAT) 20-100 MCG/ACT AERS respimat Inhale 1  puff into the lungs every 6 (six) hours. Shortness of breath or wheezing  . nitroGLYCERIN (NITROSTAT) 0.4 MG SL tablet Place 1 tablet (0.4 mg total) under the tongue every 5 (five) minutes x 3 doses as needed for chest pain.  [name] omeprazole (PRILOSEC) 20 MG capsule Take 20 mg by mouth 2 (two) times daily.  . Pancrelipase, Lip-Prot-Amyl, (CREON) 24000 units CPEP 1 po tid w/meals  . promethazine-codeine (PHENERGAN WITH CODEINE) 6.25-10 MG/5ML syrup Take 5 mLs by mouth every 4 (four) hours as needed.  . rivaroxaban (XARELTO) 20 MG TABS tablet  Take 1 tablet (20 mg total) by mouth daily with supper.  . tamsulosin (FLOMAX) 0.4 MG CAPS capsule take 1 capsule by mouth once daily after SUPPER  . tiotropium (SPIRIVA) 18 MCG inhalation capsule Place 1 capsule (18 mcg total) into inhaler and inhale daily.   No facility-administered encounter medications on file as of 09/09/2015.

## 2015-09-09 NOTE — Patient Instructions (Signed)
Quit smoking Continue Symbicort and Spiriva Views Combivent as needed for chest tightness, shortness of breath, or wheezing We will see you back in 4 months or sooner if needed

## 2015-09-09 NOTE — Assessment & Plan Note (Signed)
He has moderate airflow obstruction but fairly severe disease because of recurrent exacerbations throughout the year. This is due solely to the fact that he continues to smoke cigarettes. See below.  Today we reviewed the concept of controller versus rescue medications. It sounds as if he is fairly compliant with his controller medicines. I am okay with him using Combivent as needed up to 4 times a day. We reviewed the basic indications for these today.  Plan: Continue Symbicort Continue Spiriva Continue Combivent as needed Follow-up 4 months

## 2015-09-11 ENCOUNTER — Other Ambulatory Visit: Payer: Self-pay | Admitting: *Deleted

## 2015-09-11 MED ORDER — FUROSEMIDE 40 MG PO TABS: 20.0000 mg | ORAL_TABLET | Freq: Every day | ORAL | Status: DC

## 2015-09-17 ENCOUNTER — Ambulatory Visit (INDEPENDENT_AMBULATORY_CARE_PROVIDER_SITE_OTHER): Payer: Medicare Other | Admitting: Internal Medicine

## 2015-09-17 ENCOUNTER — Encounter: Payer: Self-pay | Admitting: Internal Medicine

## 2015-09-17 VITALS — BP 94/52 | HR 62 | Ht 71.0 in | Wt 210.0 lb

## 2015-09-17 DIAGNOSIS — G8929 Other chronic pain: Secondary | ICD-10-CM

## 2015-09-17 DIAGNOSIS — K5909 Other constipation: Secondary | ICD-10-CM | POA: Diagnosis not present

## 2015-09-17 DIAGNOSIS — R1031 Right lower quadrant pain: Secondary | ICD-10-CM

## 2015-09-17 DIAGNOSIS — I25810 Atherosclerosis of coronary artery bypass graft(s) without angina pectoris: Secondary | ICD-10-CM | POA: Diagnosis not present

## 2015-09-17 DIAGNOSIS — Z86010 Personal history of colonic polyps: Secondary | ICD-10-CM

## 2015-09-17 DIAGNOSIS — R140 Abdominal distension (gaseous): Secondary | ICD-10-CM | POA: Diagnosis not present

## 2015-09-17 NOTE — Patient Instructions (Signed)
Please follow up as needed 

## 2015-09-17 NOTE — Progress Notes (Signed)
HISTORY OF PRESENT ILLNESS:  Steven Guzman is a 75 y.o. male with multiple significant advanced medical problems including, but not limited to, coronary artery disease, congestive heart failure with significantly depressed ejection fraction (less than 35%), ventricular tachycardia status post ablation and implantable defibrillator placement, advanced COPD with ongoing tobacco abuse, chronic renal insufficiency, hypertension, hyperlipidemia, abdominal aortic aneurysm, nasal cell cancer, and biliary pancreatitis. Had a cardiac event with V. tach during his ERCP with stone extraction. His gallbladder remains in place due to his high surgical risk. He was seen in this office by myself July 2016. He presents today with his wife [name] questioning the need for surveillance colonoscopy. We did obtain his outside pathology report from 08/08/2010 which revealed diminutive adenomas and hyper plastic colon polyps. No routine surveillance colonoscopy was recommended due to his age and comorbidities. Patient continues with chronic stable constipation for which she takes glycerin suppositories. He mentioned clay-colored stool on 1 occasion, which worried him. No steatorrhea. He does complain of chronic bloating and difficulty breathing for which his PCP provided prednisone and Creon. He denies melena or hematochezia. Does have chronic sharp discomfort in the right lower quadrant which last only a few seconds. No exacerbating or relieving factors. Different than his biliary pain. Review of blood work from 07/26/2015 shows normal hemoglobin of 13.7.  REVIEW OF SYSTEMS:  All non-GI ROS negative except for sinus and allergy, anxiety, back pain, cough, shortness of breath  Past Medical History  Diagnosis Date  . Hypertension   . Hyperlipidemia   . GERD (gastroesophageal reflux disease)   . Diverticulosis of colon 07/2014    noted on CT  . CAD (coronary artery disease)     a. s/p MI in 1994 with PCI to LAD at that time  b. cath 10/2012 demonstrated EF 30%, inferior akinesis with mild hypokinesis of all walls, patent LAD and RCA stents; ostial PDA with 80-90% obstruction with medical therapy recommended   . Depression   . Aneurysm ([place])     abdominal <4. 3.6 cm on ultrasound 09/2014.   . Tobacco abuse   . Ventricular tachycardia ([place])     a. 08/2009 s/p BSX E110 Teligen 100 AICD, ser#: [id number];  b. 08/2008 VT req ATP - detection reprogrammed from 160 to 150. c. EPS and VT ablation by Dr. Lovena Le 12/21/2014  . Chronic systolic CHF (congestive heart failure) (HCC)     EF 30 to 35 % as of 09/2014.   [name] Hiatal hernia   . Prostate enlargement 07/2014    observed on CT  . Hyperglycemia 10/2012.  [name] AICD (automatic cardioverter/defibrillator) present   . Basal cell carcinoma of nose     S/P MOHS  . Complication of anesthesia 10/2014    "had to have defibrillator w/ERCP"  . Anginal pain ([employer])   . Myocardial infarction [facility]) 1994; 2011  . COPD (chronic obstructive pulmonary disease) ([place])     a. followed by pulmonary, COPD GOLD stage II  . Pneumonia 1946; 2015  . Chronic bronchitis ([place])     "was having it q yr; haven't had it in a couple years" (12/21/2014)  . Anxiety   . Biliary acute pancreatitis     Past Surgical History  Procedure Laterality Date  . Foot surgery Left 2005    "fixed bone that stuck out in my ankle area"  . Hemorrhoid banding    . Retinal detachment surgery Right 2013  . Mohs surgery  2008    nose, skin graft  .  Colonoscopy    . Tenolysis Right 12/21/2013    Procedure: TENOLYSIS FLEXOR CARPI RADIALIS ,DEBRIDEMENT RIGHT JOINT WRIST,DEBRIDEMENT SCAPHOTRAPEZIAL TRAPEZOID, REPAIR OF EXTENSOR HOOD;  Surgeon: Daryll Brod, MD;  Location: [place];  Service: Orthopedics;  Laterality: Right;  . Left heart catheterization with coronary angiogram N/A 11/25/2012    demonstrated EF 30%, inferior akinesis with mild hypokinesis of all walls, patent LAD and RCA stents; ostial PDA with 80-90%  obstruction with medical therapy recommended  . Implantable cardioverter defibrillator implant  09/06/09    BSX dual chamber ICD implanted in [place] for cardiac arrest 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  . Ercp N/A 11/16/2014    Procedure: ENDOSCOPIC RETROGRADE CHOLANGIOPANCREATOGRAPHY (ERCP);  Surgeon: [name], MD;  Location: [place];  Service: Endoscopy;  Laterality: N/A;  . V-tach ablation  12/21/2014  . Inguinal hernia repair Right ~ 1995  . Eye surgery    . Cataract extraction w/ intraocular lens  implant, bilateral Bilateral ~ 2011  . Electrophysiologic study N/A 12/21/2014    Procedure: V Tach Ablation;  Surgeon: [name], MD;  Location: [place] [facility];  Service: Cardiovascular;  Laterality: N/A;    Social History [name]  reports that he has been smoking Cigarettes.  He has a 27.5 pack-year smoking history. He has never used smokeless tobacco. He reports that he drinks alcohol. He reports that he does not use illicit drugs.  family history includes CAD in his father and mother; Heart attack in his brother; Hypertension in his brother, father, and mother. There is no history of Stroke.  Allergies  Allergen Reactions  . Sulfa Antibiotics Hives       PHYSICAL EXAMINATION: Vital signs: BP 94/52 mmHg  Pulse 62  Ht 5\' 11"  (1.803 m)  Wt 210 lb (95.255 kg)  BMI 29.30 kg/m2  Constitutional: Chronically ill-appearing, no acute distress. Smoker's cough Psychiatric: alert and oriented x3, cooperative Eyes: extraocular movements intact, anicteric, conjunctiva pink Mouth: oral pharynx moist, no lesions Neck: supple without thyromegaly Lymph: no lymphadenopathy Cardiovascular: heart regular rate and rhythm, soft systolic murmur Lungs: clear to auscultation bilaterally with distant breath sounds Abdomen: soft, nontender, nondistended, no obvious ascites, no peritoneal signs, normal bowel sounds, no organomegaly Rectal: Omitted Extremities: no clubbing cyanosis or  lower extremity edema bilaterally Skin: Multiple ecchymoses. no additional lesions on visible extremities Neuro: No focal deficits. Cranial nerves intact  ASSESSMENT:  #1. History of diminutive polyps 2012 elsewhere as described #2. Intestinal bloating. Nonspecific #3. History of biliary pancreatitis. Given Creon #4. Chronic stable constipation #5. Fleeting right lower quadrant discomfort consistent with spasm or musculoskeletal. Chronic and stable #6. Multiple significant medical problems  PLAN:  #1. The patient is not appropriate for surveillance colonoscopy given his age and particularly his comorbidities. He is at high-risk for procedure related complications and would not tolerate such. I discussed this candidly. Should he develop signs or symptoms that warrant investigation, this could be revisited. They understand #2. Advised stop smoking as this may help bloating #3. I do not feel that he has pancreatic insufficiency. Can take Creon if he feels it helps, but not necessarily. We could always check fecal elastase #4. Continue with suppositories as needed for constipation #5. Reassurance regarding right lower quadrant discomfort #6. Ongoing general medical care with PCP and pulmonary and cardiology specialists. GI follow-up as needed  25 minutes was spent face-to-face with the patient. Greater than 50% a time use for counseling regarding his multiple GI complaints and my  recommendations regarding surveillance colonoscopy. Multiple questions for the patient and his wife addressed to their satisfaction

## 2015-09-26 ENCOUNTER — Encounter: Payer: Self-pay | Admitting: Internal Medicine

## 2015-09-26 ENCOUNTER — Ambulatory Visit (INDEPENDENT_AMBULATORY_CARE_PROVIDER_SITE_OTHER): Payer: Medicare Other | Admitting: Internal Medicine

## 2015-09-26 VITALS — BP 108/78 | HR 57 | Temp 97.9°F | Resp 18 | Ht 72.0 in | Wt 212.0 lb

## 2015-09-26 DIAGNOSIS — J449 Chronic obstructive pulmonary disease, unspecified: Secondary | ICD-10-CM | POA: Diagnosis not present

## 2015-09-26 DIAGNOSIS — M546 Pain in thoracic spine: Secondary | ICD-10-CM

## 2015-09-26 DIAGNOSIS — J029 Acute pharyngitis, unspecified: Secondary | ICD-10-CM

## 2015-09-26 DIAGNOSIS — J028 Acute pharyngitis due to other specified organisms: Secondary | ICD-10-CM

## 2015-09-26 DIAGNOSIS — B9789 Other viral agents as the cause of diseases classified elsewhere: Secondary | ICD-10-CM

## 2015-09-26 DIAGNOSIS — I25810 Atherosclerosis of coronary artery bypass graft(s) without angina pectoris: Secondary | ICD-10-CM | POA: Diagnosis not present

## 2015-09-26 MED ORDER — BUSPIRONE HCL 15 MG PO TABS: 15.0000 mg | ORAL_TABLET | Freq: Three times a day (TID) | ORAL | Status: DC

## 2015-09-26 NOTE — Patient Instructions (Signed)
Your strep test was negative today and we do need to give any antibiotics.   We have put in the referral for orthopedics for the back pain.   If you are still having the sore throat on Monday call us back.  We will send the note to Dr. Benjamine Mola.

## 2015-09-26 NOTE — Progress Notes (Signed)
Pre visit review using our clinic review tool, if applicable. No additional management support is needed unless otherwise documented below in the visit note. 

## 2015-09-26 NOTE — Progress Notes (Signed)
   Subjective:    Patient ID: Steven Guzman, male    DOB: [birth year], 75 y.o.   MRN: [id number]  HPI The patient is a 75 YO man coming in for sore throat for 1 day. He is a current smoker and about 1/2 PPD. Started about 2-3 days ago. Overall stable since then. Has not tried anything for it. Denies any weight change. No new SOB or cough. They are stable to his baseline. Still smoking and maybe slightly more. No fevers or chills. No neck swelling. Some mild nasal drainage. He feels he has had yellow discharge around his tonsils.   Review of Systems  Constitutional: Negative for fever, chills, activity change, appetite change, fatigue and unexpected weight change.  HENT: Positive for congestion and sore throat. Negative for ear discharge, ear pain, postnasal drip, rhinorrhea and sinus pressure.   Respiratory: Positive for cough and shortness of breath. Negative for chest tightness and wheezing.        Stable  Cardiovascular: Negative for chest pain, palpitations and leg swelling.  Gastrointestinal: Negative for diarrhea, constipation and abdominal distention.  Musculoskeletal: Positive for arthralgias.  Neurological: Negative for dizziness, tremors, light-headedness, numbness and headaches.      Objective:   Physical Exam  Constitutional: He is oriented to person, place, and time. He appears well-developed and well-nourished.  HENT:  Head: Normocephalic and atraumatic.  Right Ear: External ear normal.  Left Ear: External ear normal.  Oropharynx with redness and slight white to clear discharge right tonsil which was tested for strep with rapid test  Eyes: EOM are normal.  Neck: Normal range of motion.  Cardiovascular: Normal rate.   Pulmonary/Chest: Effort normal and breath sounds normal.  Some wheezing left more than right, good air flow.   Abdominal: Soft. He exhibits no distension. There is no tenderness. There is no rebound.  Musculoskeletal: He exhibits no edema.  Neurological:  He is alert and oriented to person, place, and time.  Skin: Skin is warm and dry.   Filed Vitals:   09/26/15 1551  BP: 108/78  Pulse: 57  Temp: 97.9 F (36.6 C)  TempSrc: Oral  Resp: 18  Height: 6' (1.829 m)  Weight: 212 lb (96.163 kg)  SpO2: 94%      Assessment & Plan:

## 2015-09-27 DIAGNOSIS — J028 Acute pharyngitis due to other specified organisms: Secondary | ICD-10-CM

## 2015-09-27 DIAGNOSIS — B9789 Other viral agents as the cause of diseases classified elsewhere: Secondary | ICD-10-CM | POA: Insufficient documentation

## 2015-09-27 NOTE — Assessment & Plan Note (Signed)
No lesion noted on exam, rapid strep test in the office negative. No antibiotics prescribed. Low risk CENTOR criteria. He will start taking back zyrtec and consider flonase. He has visit with ENT next week and will forward them this note to follow up on his throat.

## 2015-09-27 NOTE — Assessment & Plan Note (Signed)
Does not appear to be flare. No worsening cough or SOB.

## 2015-10-03 ENCOUNTER — Other Ambulatory Visit: Payer: Self-pay | Admitting: *Deleted

## 2015-10-03 MED ORDER — GABAPENTIN 100 MG PO CAPS: ORAL_CAPSULE | ORAL | Status: DC

## 2015-10-04 ENCOUNTER — Ambulatory Visit (HOSPITAL_BASED_OUTPATIENT_CLINIC_OR_DEPARTMENT_OTHER)
Admission: RE | Admit: 2015-10-04 | Discharge: 2015-10-04 | Disposition: A | Discharge status: Home / Self Care | Payer: Medicare Other | Source: Ambulatory Visit | Attending: Pulmonary Disease | Admitting: Pulmonary Disease

## 2015-10-04 DIAGNOSIS — I700 Atherosclerosis of aorta: Secondary | ICD-10-CM | POA: Insufficient documentation

## 2015-10-04 DIAGNOSIS — Z122 Encounter for screening for malignant neoplasm of respiratory organs: Secondary | ICD-10-CM | POA: Insufficient documentation

## 2015-10-04 DIAGNOSIS — J439 Emphysema, unspecified: Secondary | ICD-10-CM | POA: Insufficient documentation

## 2015-10-04 DIAGNOSIS — Z87891 Personal history of nicotine dependence: Secondary | ICD-10-CM

## 2015-10-04 DIAGNOSIS — F17210 Nicotine dependence, cigarettes, uncomplicated: Secondary | ICD-10-CM | POA: Insufficient documentation

## 2015-10-04 DIAGNOSIS — I2510 Atherosclerotic heart disease of native coronary artery without angina pectoris: Secondary | ICD-10-CM | POA: Diagnosis not present

## 2015-10-10 NOTE — ED Provider Notes (Signed)
Medical screening examination/treatment/procedure(s) were conducted as a shared visit with non-physician practitioner(s) and myself.  I personally evaluated the patient during the encounter.  Agree with plan.  Pt knows to return if worse.   EKG Interpretation   Date/Time:  Friday July 26 2015 22:00:33 EDT Ventricular Rate:  60 PR Interval:  212 QRS Duration: 134 QT Interval:  470 QTC Calculation: 470 R Axis:   98 Text Interpretation:  Atrial-paced rhythm with prolonged AV conduction  Artifact Abnormal ECG Confirmed by Carmin Muskrat  MD ([id number]) on  07/27/2015 10:29:29 AM       Isla Pence, MD 10/10/15 1243

## 2015-10-15 DIAGNOSIS — J310 Chronic rhinitis: Secondary | ICD-10-CM | POA: Diagnosis not present

## 2015-10-15 DIAGNOSIS — R490 Dysphonia: Secondary | ICD-10-CM | POA: Diagnosis not present

## 2015-10-15 DIAGNOSIS — R0982 Postnasal drip: Secondary | ICD-10-CM | POA: Diagnosis not present

## 2015-10-15 DIAGNOSIS — R070 Pain in throat: Secondary | ICD-10-CM | POA: Diagnosis not present

## 2015-10-16 ENCOUNTER — Encounter: Payer: Self-pay | Admitting: Internal Medicine

## 2015-10-16 ENCOUNTER — Ambulatory Visit (INDEPENDENT_AMBULATORY_CARE_PROVIDER_SITE_OTHER): Payer: Medicare Other | Admitting: *Deleted

## 2015-10-16 ENCOUNTER — Encounter: Payer: Self-pay | Admitting: Interventional Cardiology

## 2015-10-16 DIAGNOSIS — I480 Paroxysmal atrial fibrillation: Secondary | ICD-10-CM

## 2015-10-16 DIAGNOSIS — I472 Ventricular tachycardia, unspecified: Secondary | ICD-10-CM

## 2015-10-16 DIAGNOSIS — I255 Ischemic cardiomyopathy: Secondary | ICD-10-CM

## 2015-10-16 DIAGNOSIS — M546 Pain in thoracic spine: Secondary | ICD-10-CM | POA: Diagnosis not present

## 2015-10-16 DIAGNOSIS — M545 Low back pain: Secondary | ICD-10-CM | POA: Diagnosis not present

## 2015-10-16 DIAGNOSIS — I5022 Chronic systolic (congestive) heart failure: Secondary | ICD-10-CM | POA: Diagnosis not present

## 2015-10-16 LAB — CUP PACEART INCLINIC DEVICE CHECK
Date Time Interrogation Session: 20170719040000
HIGH POWER IMPEDANCE MEASURED VALUE: 43 Ohm
HIGH POWER IMPEDANCE MEASURED VALUE: 52 Ohm
Implantable Lead Implant Date: 20110610
Implantable Lead Location: 753859
Implantable Lead Model: 4135
Lead Channel Impedance Value: 450 Ohm
Lead Channel Impedance Value: 613 Ohm
Lead Channel Pacing Threshold Pulse Width: 0.4 ms
Lead Channel Sensing Intrinsic Amplitude: 17.8 mV
Lead Channel Sensing Intrinsic Amplitude: 2.3 mV
Lead Channel Setting Pacing Amplitude: 2 V
Lead Channel Setting Sensing Sensitivity: 0.6 mV
MDC IDC LEAD IMPLANT DT: 20110610
MDC IDC LEAD LOCATION: 753860
MDC IDC LEAD MODEL: 185
MDC IDC LEAD SERIAL: 28681386
MDC IDC LEAD SERIAL: 339643
MDC IDC MSMT LEADCHNL RA PACING THRESHOLD AMPLITUDE: 1 V
MDC IDC MSMT LEADCHNL RV PACING THRESHOLD AMPLITUDE: 1 V
MDC IDC MSMT LEADCHNL RV PACING THRESHOLD PULSEWIDTH: 0.4 ms
MDC IDC SET LEADCHNL RV PACING AMPLITUDE: 2 V
MDC IDC SET LEADCHNL RV PACING PULSEWIDTH: 0.4 ms
Pulse Gen Serial Number: 164892

## 2015-10-16 NOTE — Progress Notes (Signed)
ICD check in clinic. Normal device function. Thresholds and sensing consistent with previous device measurements. Impedance trends stable over time. (6) ventricular arrhythmias--brief VT-NS per EGMs. <1% AT/AF burden--max dur. 2hrs + Xarelto/Amio. Histogram distribution appropriate for patient and level of activity. No changes made this session. Device programmed at appropriate safety margins. Device programmed to optimize intrinsic conduction. Estimated longevity 6 years. Pt will follow up with the Device clinic in 3 months and with [facility] in 02-2016. Patient education completed including shock plan.

## 2015-10-24 ENCOUNTER — Encounter: Payer: Self-pay | Admitting: Internal Medicine

## 2015-10-27 ENCOUNTER — Other Ambulatory Visit: Payer: Self-pay | Admitting: Internal Medicine

## 2015-10-27 ENCOUNTER — Other Ambulatory Visit: Payer: Self-pay | Admitting: Interventional Cardiology

## 2015-11-11 DIAGNOSIS — L821 Other seborrheic keratosis: Secondary | ICD-10-CM | POA: Diagnosis not present

## 2015-11-11 DIAGNOSIS — L820 Inflamed seborrheic keratosis: Secondary | ICD-10-CM | POA: Diagnosis not present

## 2015-11-11 DIAGNOSIS — D485 Neoplasm of uncertain behavior of skin: Secondary | ICD-10-CM | POA: Diagnosis not present

## 2015-11-11 DIAGNOSIS — B351 Tinea unguium: Secondary | ICD-10-CM | POA: Diagnosis not present

## 2015-11-11 IMAGING — CR DG CHEST 1V PORT
1 series · 1 of 1 positions shown · non-contrast
Comparison: Chest radiograph performed 08/15/2014

CLINICAL DATA: Acute onset of generalized chest pain and shortness
of breath. Initial encounter.

EXAM:
PORTABLE CHEST - 1 VIEW

[AP]
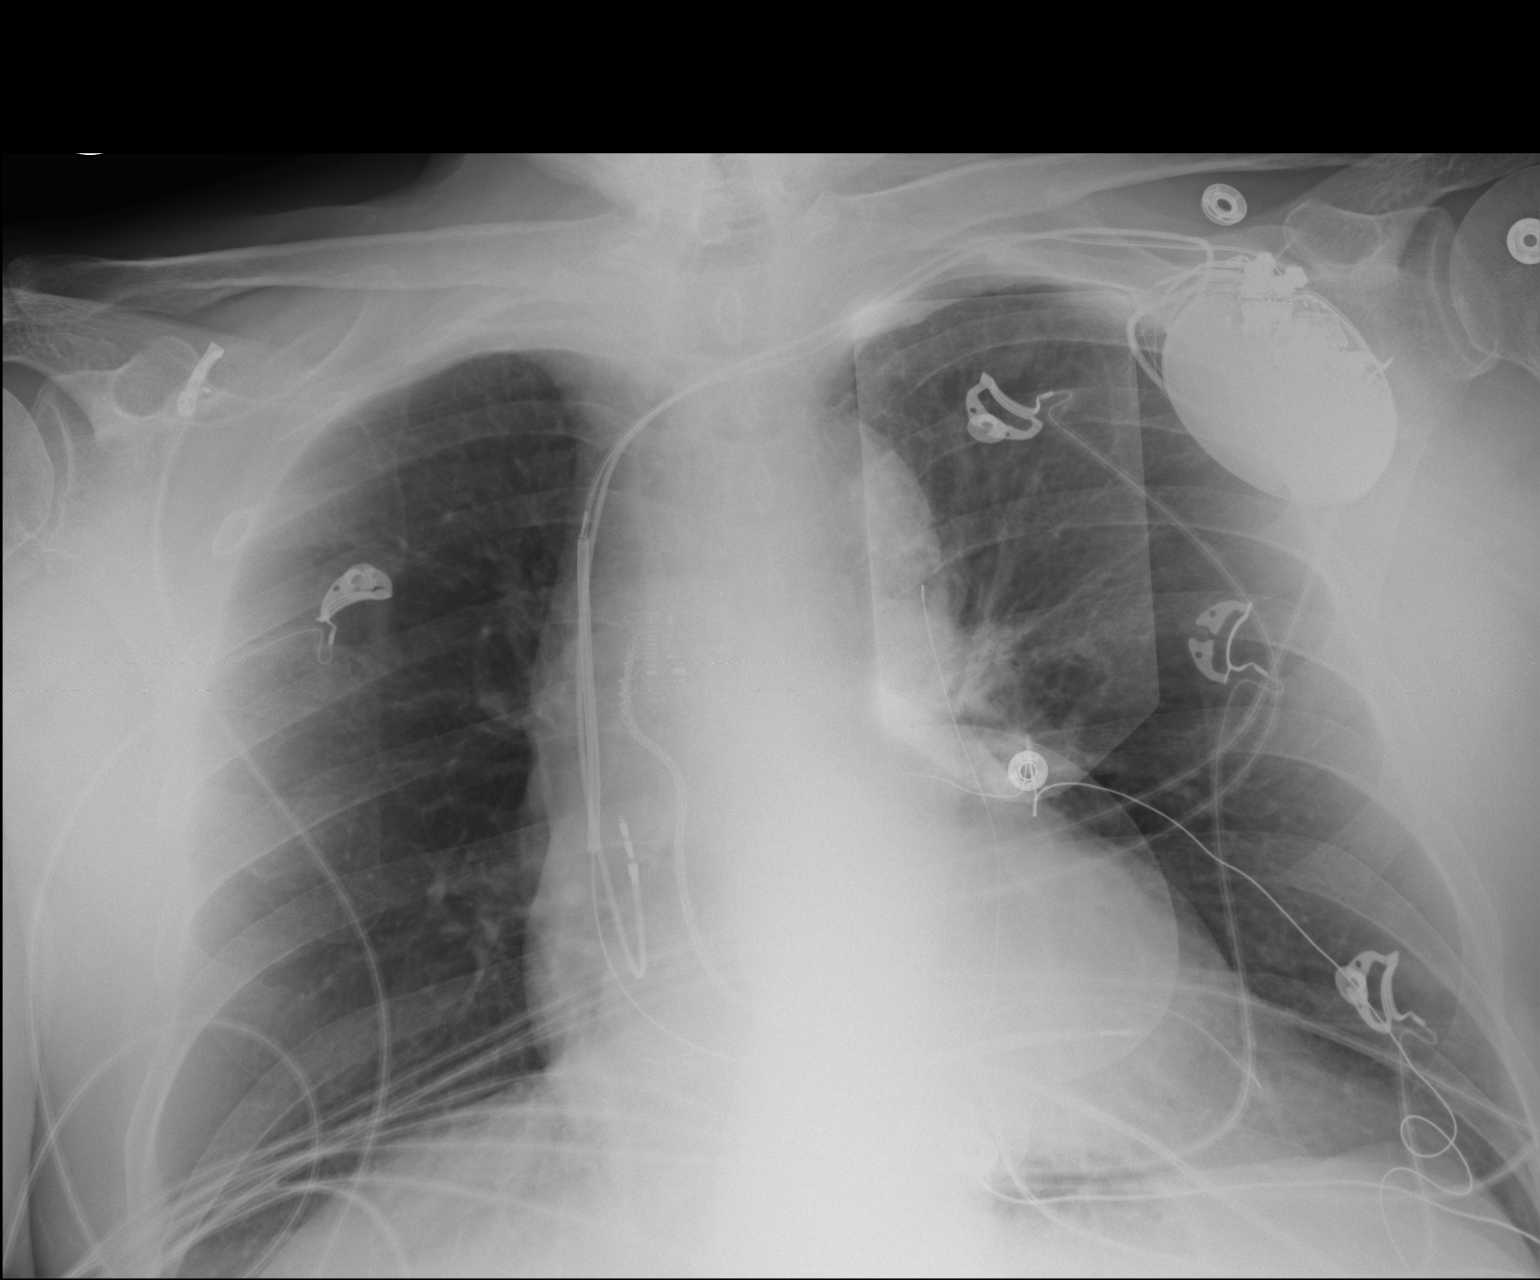

[1 of 1 positions shown; findings below may reference images not displayed]

FINDINGS: The lungs are well-aerated and clear. There is no evidence of focal
opacification, pleural effusion or pneumothorax.

The cardiomediastinal silhouette is normal in size. A pacemaker/AICD
is noted overlying the left chest wall, with leads ending overlying
the right atrium and right ventricle. No acute osseous abnormalities
are seen. External pacing pads are noted.
IMPRESSION: No acute cardiopulmonary process seen.

## 2015-11-13 ENCOUNTER — Other Ambulatory Visit: Payer: Self-pay | Admitting: Interventional Cardiology

## 2015-12-06 ENCOUNTER — Ambulatory Visit (INDEPENDENT_AMBULATORY_CARE_PROVIDER_SITE_OTHER): Payer: Medicare Other | Admitting: Physician Assistant

## 2015-12-06 ENCOUNTER — Telehealth: Payer: Self-pay | Admitting: Internal Medicine

## 2015-12-06 ENCOUNTER — Encounter: Payer: Self-pay | Admitting: Physician Assistant

## 2015-12-06 VITALS — BP 98/62 | HR 60 | Temp 97.7°F | Resp 18 | Ht 72.0 in | Wt 219.4 lb

## 2015-12-06 DIAGNOSIS — R042 Hemoptysis: Secondary | ICD-10-CM | POA: Diagnosis not present

## 2015-12-06 DIAGNOSIS — I255 Ischemic cardiomyopathy: Secondary | ICD-10-CM

## 2015-12-06 NOTE — Progress Notes (Signed)
Patient presents to clinic today c/o blood tinged sputum x 2 days, noted first on waking. Patient with history of COPD and GERD among multiple other chronic health issues. Has notes similar episodes like this over the past 6 months. Endorses bright red blood in sputum in the mornings on these days. Resolved by afternoon. Denies chest pain. Denies shortness of breath or wheezing above baseline COPD. Denies recent foreign travel or exposure to TB. Notes some intermittent nasal congestion with PND and dry throat. Denies sinus pain, ear pain, headache, fever, chills or other URI symptoms. Is followed by Pulmonology with recent CT Chest to screen for lung cancer on 10/04/15. Negative for findings to concern for cancer.  Past Medical History:  Diagnosis Date  . AICD (automatic cardioverter/defibrillator) present   . Aneurysm ([place])    abdominal <4. 3.6 cm on ultrasound 09/2014.   . Anginal pain ([place])   . Anxiety   . Basal cell carcinoma of nose    S/P MOHS  . Biliary acute pancreatitis   . CAD (coronary artery disease)    a. s/p MI in 1994 with PCI to LAD at that time b. cath 10/2012 demonstrated EF 30%, inferior akinesis with mild hypokinesis of all walls, patent LAD and RCA stents; ostial PDA with 80-90% obstruction with medical therapy recommended   . Chronic bronchitis ([place])    "was having it q yr; haven't had it in a couple years" (12/21/2014)  . Chronic systolic CHF (congestive heart failure) (HCC)    EF 30 to 35 % as of 09/2014.   [name] Complication of anesthesia 10/2014   "had to have defibrillator w/ERCP"  . COPD (chronic obstructive pulmonary disease) ([place])    a. followed by pulmonary, COPD GOLD stage II  . Depression   . Diverticulosis of colon 07/2014   noted on CT  . GERD (gastroesophageal reflux disease)   . Hiatal hernia   . Hyperglycemia 10/2012.  [name] Hyperlipidemia   . Hypertension   . Myocardial infarction [facility]) 1994; 2011  . Pneumonia 1946; 2015  . Prostate enlargement 07/2014   observed on CT  . Tobacco abuse   . Ventricular tachycardia ([place])    a. 08/2009 s/p BSX E110 Teligen 100 AICD, ser#: [id number];  b. 08/2008 VT req ATP - detection reprogrammed from 160 to 150. c. EPS and VT ablation by Dr. Lovena Le 12/21/2014    Current Outpatient Prescriptions on File Prior to Visit  Medication Sig Dispense Refill  . amiodarone (PACERONE) 200 MG tablet Take 1 tablet (200 mg total) by mouth daily. Take one tablet by mouth in the morning 30 tablet 9  . atorvastatin (LIPITOR) 80 MG tablet take 1 tablet by mouth once daily 30 tablet 9  . benazepril (LOTENSIN) 10 MG tablet take 1 tablet by mouth once daily 90 tablet 2  . budesonide-formoterol (SYMBICORT) 160-4.5 MCG/ACT inhaler inhale 2 puffs by mouth every morning and 2 puffs every evening ; SEPARATE DOSES BY 12 HOURS 10.2 Inhaler 5  . busPIRone (BUSPAR) 15 MG tablet Take 1 tablet (15 mg total) by mouth 3 (three) times daily. 90 tablet 5  . carvedilol (COREG) 12.5 MG tablet take 1 tablet by mouth twice a day with meals 180 tablet 2  . cetirizine (ZYRTEC) 10 MG tablet Take 10 mg by mouth daily as needed for allergies.    . Choline Fenofibrate (FENOFIBRIC ACID) 135 MG CPDR take 1 capsule by mouth once daily 30 capsule 9  . escitalopram (LEXAPRO) 20 MG tablet Take  1 tablet (20 mg total) by mouth daily. 30 tablet 6  . fluticasone (FLONASE) 50 MCG/ACT nasal spray Place 2 sprays into both nostrils daily.    . furosemide (LASIX) 40 MG tablet Take 0.5 tablets (20 mg total) by mouth daily. 15 tablet 9  . gabapentin (NEURONTIN) 100 MG capsule take 3 capsules by mouth twice a day 540 capsule 2  . Ipratropium-Albuterol (COMBIVENT RESPIMAT) 20-100 MCG/ACT AERS respimat Inhale 1 puff into the lungs every 6 (six) hours. Shortness of breath or wheezing    . nitroGLYCERIN (NITROSTAT) 0.4 MG SL tablet Place 1 tablet (0.4 mg total) under the tongue every 5 (five) minutes x 3 doses as needed for chest pain. 25 tablet 11  . omeprazole (PRILOSEC) 20 MG capsule  Take 20 mg by mouth 2 (two) times daily.    . Pancrelipase, Lip-Prot-Amyl, (CREON) 24000 units CPEP 1 po tid w/meals (Patient taking differently: 1 po tid w/meals as needed) 90 capsule 3  . promethazine-codeine (PHENERGAN WITH CODEINE) 6.25-10 MG/5ML syrup Take 5 mLs by mouth every 4 (four) hours as needed. 300 mL 0  . rivaroxaban (XARELTO) 20 MG TABS tablet Take 1 tablet (20 mg total) by mouth daily with supper. 30 tablet 11  . tamsulosin (FLOMAX) 0.4 MG CAPS capsule take 1 capsule by mouth once daily after SUPPER 30 capsule 3  . tiotropium (SPIRIVA) 18 MCG inhalation capsule Place 1 capsule (18 mcg total) into inhaler and inhale daily. 30 capsule 5   No current facility-administered medications on file prior to visit.     Allergies  Allergen Reactions  . Sulfa Antibiotics Hives    Family History  Problem Relation Age of Onset  . Heart attack Brother   . CAD Father   . CAD Mother   . Hypertension Mother   . Hypertension Father   . Hypertension Brother   . Stroke Neg Hx     Social History   Social History  . Marital status: Married    Spouse name: N/A  . Number of children: N/A  . Years of education: N/A   Occupational History  . Retired    Social History Main Topics  . Smoking status: Current Every Day Smoker    Packs/day: 0.50    Years: 55.00    Types: Cigarettes  . Smokeless tobacco: Never Used     Comment: up to 1ppd  . Alcohol use 0.0 oz/week     Comment: 12/21/2014 "might have a couple glasses of wine/year"  . Drug use: No  . Sexual activity: Not Currently   Other Topics Concern  . None   Social History Narrative  . None   Review of Systems - See HPI.  All other ROS are negative.  BP 98/62 (BP Location: Right Arm, Patient Position: Sitting, Cuff Size: Large)   Pulse 60   Temp 97.7 F (36.5 C) (Oral)   Resp 18   Ht 6' (1.829 m)   Wt 219 lb 6 oz (99.5 kg)   SpO2 96%   BMI 29.75 kg/m   Physical Exam  Constitutional: He is oriented to person,  place, and time and well-developed, well-nourished, and in no distress.  HENT:  Head: Normocephalic and atraumatic.  Right Ear: Tympanic membrane normal.  Left Ear: Tympanic membrane normal.  Nose: Mucosal edema present. Right sinus exhibits no maxillary sinus tenderness and no frontal sinus tenderness. Left sinus exhibits no maxillary sinus tenderness and no frontal sinus tenderness.  Mouth/Throat: Uvula is midline, oropharynx is clear and  moist and mucous membranes are normal.  Significant dryness of nasal mucosa noted. No active epistaxis.  Cardiovascular: Normal rate, regular rhythm, normal heart sounds and intact distal pulses.   Pulmonary/Chest: Effort normal and breath sounds normal. No respiratory distress. He has no wheezes. He has no rales. He exhibits no tenderness.  Neurological: He is alert and oriented to person, place, and time.  Skin: Skin is warm and dry. No rash noted.  Psychiatric: Affect normal.  Vitals reviewed.   Recent Results (from the past 2160 hour(s))  Implantable device check     Status: None   Collection Time: 10/16/15  3:26 PM  Result Value Ref Range   Date Time Interrogation Session [id number]    Pulse Generator Manufacturer BOST    Pulse Gen Model E110 TELIGEN 100    Pulse Gen Serial Number [id number]    Implantable Pulse Generator Type Implantable Cardiac Defibulator    Implantable Pulse Generator Implant Date [id number]    Implantable Lead Manufacturer [facility]    Implantable Lead Model 0185    Implantable Lead Serial Number [id number]    Implantable Lead Implant Date [id number]    Implantable Lead Location [id number]    Implantable Lead Manufacturer [facility]    Implantable Lead Model 4135    Implantable Lead Serial Number [id number]    Implantable Lead Implant Date [id number]    Implantable Lead Location [id number]    Lead Channel Setting Sensing Sensitivity 0.6 mV   Lead Channel Setting Sensing Adaptation Mode Adaptive Sensing    Lead Channel Setting Pacing  Amplitude 2.0 V   Lead Channel Setting Pacing Pulse Width 0.4 ms   Lead Channel Setting Pacing Amplitude 2.0 V   Lead Channel Impedance Value 613.0 ohm   Lead Channel Sensing Intrinsic Amplitude 2.3 mV   Lead Channel Pacing Threshold Amplitude 1.0000 V   Lead Channel Pacing Threshold Pulse Width 0.4 ms   Lead Channel Impedance Value 450.0 ohm   Lead Channel Sensing Intrinsic Amplitude 17.8 mV   Lead Channel Pacing Threshold Amplitude 1.0000 V   Lead Channel Pacing Threshold Pulse Width 0.4 ms   HighPow Impedance 52.0 ohm   HighPow Impedance 43.0 ohm   Battery Status BOS    Eval Rhythm SB     Assessment/Plan: 1. Hemoptysis Question iff more so a pseudohemoptysis. Nasal turbinates swollen but dry. Could very well be irritation in nose causing mild bleeding draining down and being coughed up. Patient does have history of COPD and is a current tobacco user. Recent CT chest reassuring. Will start supportive measures to hydrate nasal passages and oropharynx. Symptoms should resolve if this is the culprit. Regardless giving chronicity of these recurrent symptoms, will have him schedule FU wit Dr. [place] Bells for further assessment.    Leeanne Rio, PA-C

## 2015-12-06 NOTE — Patient Instructions (Signed)
Please increase hydration.  Place a humidifier in the bedroom. Use the Biotene dry mouth mouthwash or spray a few times per day. Sugar-free gum will help stimulate salivation.  I want you to schedule a follow-up with Dr. [place] Bells to discuss further assessment and potential for bronchoscopy. Recent CT reviewed without sign of CA. And as you said ENT assessment looked good.

## 2015-12-06 NOTE — Telephone Encounter (Signed)
atient Name: EZREN BARFUSS  DOB: 1940-04-25    Initial Comment Caller states having bloody sputum   Nurse Assessment  Nurse: Raphael Gibney, RN, Vanita Ingles Date/Time (Eastern Time): 12/06/2015 12:23:35 PM  Confirm and document reason for call. If symptomatic, describe symptoms. You must click the next button to save text entered. ---Caller states he is coughing up bloody sputum. No fever. noticed bloody sputum yesterday. he has COPD.  Has the patient traveled out of the country within the last 30 days? ---No  Does the patient have any new or worsening symptoms? ---Yes  Will a triage be completed? ---Yes  Related visit to physician within the last 2 weeks? ---No  Does the PT have any chronic conditions? (i.e. diabetes, asthma, etc.) ---Yes  List chronic conditions. ---COPD; heart problems.  Is this a behavioral health or substance abuse call? ---No     Guidelines    Guideline Title Affirmed Question Affirmed Notes  Coughing Up Blood Taking Coumadin (warfarin) or other strong blood thinner, or known bleeding disorder (e.g., thrombocytopenia)    Final Disposition User   See Physician within 24 Hours [place], RN, Vera    Comments  appt scheduled for 12/06/2015 at 3:45 pm with [place] PCP OFFICE   Disagree/Comply: Leta Baptist

## 2015-12-11 NOTE — Progress Notes (Signed)
A, Please make sure he has an appointment in the next 2-4 weeks, NP OK Thanks B

## 2015-12-25 ENCOUNTER — Other Ambulatory Visit: Payer: Self-pay | Admitting: Internal Medicine

## 2016-01-06 ENCOUNTER — Encounter: Payer: Self-pay | Admitting: Pulmonary Disease

## 2016-01-06 ENCOUNTER — Ambulatory Visit (INDEPENDENT_AMBULATORY_CARE_PROVIDER_SITE_OTHER): Payer: Medicare Other | Admitting: Pulmonary Disease

## 2016-01-06 DIAGNOSIS — Z23 Encounter for immunization: Secondary | ICD-10-CM

## 2016-01-06 DIAGNOSIS — J449 Chronic obstructive pulmonary disease, unspecified: Secondary | ICD-10-CM

## 2016-01-06 DIAGNOSIS — F17210 Nicotine dependence, cigarettes, uncomplicated: Secondary | ICD-10-CM | POA: Diagnosis not present

## 2016-01-06 DIAGNOSIS — I255 Ischemic cardiomyopathy: Secondary | ICD-10-CM | POA: Diagnosis not present

## 2016-01-06 DIAGNOSIS — R042 Hemoptysis: Secondary | ICD-10-CM | POA: Diagnosis not present

## 2016-01-06 NOTE — Patient Instructions (Signed)
Keep taking your inhaled medicines as you are doing Stop taking Xarelto after tonight's dose We will arrange for a bronchoscopy, we will call you tomorrow to go over the details.

## 2016-01-06 NOTE — Assessment & Plan Note (Signed)
This has been a new problem for him and has been recurrent over the last several weeks to months. I have personally reviewed the images from his July 2017 CT chest which showed no evidence of malignancy, but I suppose it's possible he may have an airway lesion. He is at excessive risk for lung cancer giving his smoking history.  Plan: I will reach out to his cardiologist as apparently he had an episode of either VT or V. fib after his last endoscopy If okay by cardiology then I will plan for bronchoscopy this week Hold Xarelto 48 hours prior to bronchoscopy

## 2016-01-06 NOTE — Progress Notes (Signed)
Subjective:    Patient ID: Steven Guzman, male    DOB: Oct 27, 1940, 75 y.o.   MRN: [id number]  Synopsis: GOLD Grade C COPD, still actively smoking as of November 2015 10/2013 PFT> ratio 61%, FEV1 2.53L (77% pred, 15% change), TLC 8.65 L (117% pred), RV 4.25L (161% pred), DLCO 25.39 (73% pred)  HPI Chief Complaint  Patient presents with  . Follow-up    follow up post hemoptysis episode- pt has not had any hemoptysis since his ov with PCP, states this has happened 3 times in the past 6 mos.     Hemoptysis: [place] says that he has coughed up blood several times in the last few months.  He has done it 2-3 times first thin in the morning in the last 6-8 months.  He says that he sleeps with his mouth open and feels a severe dry mouth in the mornings, but he denies any sort of sore throat.  He denies reflux symptoms in the mornings.  He does have a sore throat from time to time in the daytime.  Shortness of breath > unchanged, somewhat better than earlier this year.  Some dyspnea with bending over, he attributes to weight gain.    COPD> no exacerbations since the last visit. He is still taking the symbicort and the spiriva (he takes this at night prior to bedtime).  One night he forgot to take the Spiriva and he noticed the next day he was really short of breath compared to normal with exertion.    Tobacco> still smoking one pack per day, he and his wife have stopped for as much as 1 week at a time in the last several months.  He wants to quit.    Past Medical History:  Diagnosis Date  . AICD (automatic cardioverter/defibrillator) present   . Aneurysm ([place])    abdominal <4. 3.6 cm on ultrasound 09/2014.   . Anginal pain ([place])   . Anxiety   . Basal cell carcinoma of nose    S/P MOHS  . Biliary acute pancreatitis   . CAD (coronary artery disease)    a. s/p MI in 1994 with PCI to LAD at that time b. cath 10/2012 demonstrated EF 30%, inferior akinesis with mild hypokinesis of all walls,  patent LAD and RCA stents; ostial PDA with 80-90% obstruction with medical therapy recommended   . Chronic bronchitis ([place])    "was having it q yr; haven't had it in a couple years" (12/21/2014)  . Chronic systolic CHF (congestive heart failure) (HCC)    EF 30 to 35 % as of 09/2014.   [name] Complication of anesthesia 10/2014   "had to have defibrillator w/ERCP"  . COPD (chronic obstructive pulmonary disease) ([place])    a. followed by pulmonary, COPD GOLD stage II  . Depression   . Diverticulosis of colon 07/2014   noted on CT  . GERD (gastroesophageal reflux disease)   . Hiatal hernia   . Hyperglycemia 10/2012.  [name] Hyperlipidemia   . Hypertension   . Myocardial infarction 1994; 2011  . Pneumonia 1946; 2015  . Prostate enlargement 07/2014   observed on CT  . Tobacco abuse   . Ventricular tachycardia ([place])    a. 08/2009 s/p BSX E110 Teligen 100 AICD, ser#: [id number];  b. 08/2008 VT req ATP - detection reprogrammed from 160 to 150. c. EPS and VT ablation by Dr. Lovena Le 12/21/2014     Review of Systems  Constitutional: Negative for chills, fatigue and fever.  HENT: Negative for postnasal drip, rhinorrhea and sinus pressure.   Respiratory: Negative for cough, shortness of breath and wheezing.   Cardiovascular: Negative for chest pain, palpitations and leg swelling.       Objective:   Physical Exam Vitals:   01/06/16 1550  BP: 110/60  BP Location: Left Arm  Cuff Size: Normal  Pulse: (!) 59  SpO2: 94%  Weight: 224 lb (101.6 kg)  Height: 6' (1.829 m)   RA  Gen: well appearing, no acute distress HEENT: NCAT, EOMi, OP clear PULM: Some wheezing today bilaterally, good air movement CV: RRR, no mgr, no JVD AB: BS+, soft, nontender Ext: warm, no edema, no clubbing, no cyanosis Derm: no rash or skin breakdown Neuro: A&Ox4, MAEW  July 2017 CT chest images personally reviewed showing mild to moderate centrilobular emphysema, no obvious pulmonary parenchymal mass.    Assessment & Plan:    Hemoptysis This has been a new problem for him and has been recurrent over the last several weeks to months. I have personally reviewed the images from his July 2017 CT chest which showed no evidence of malignancy, but I suppose it's possible he may have an airway lesion. He is at excessive risk for lung cancer giving his smoking history.  Plan: I will reach out to his cardiologist as apparently he had an episode of either VT or V. fib after his last endoscopy If okay by cardiology then I will plan for bronchoscopy this week Hold Xarelto 48 hours prior to bronchoscopy  Cigarette smoker Counseled at length to quit smoking today  COPD GOLD GRADE C Stable interval despite significant symptoms he's had in the past.  Plan: Continue Symbicort and Spiriva Flu shot today Counsel to quit smoking   Updated Medication List Outpatient Encounter Prescriptions as of 01/06/2016  Medication Sig  . amiodarone (PACERONE) 200 MG tablet Take 1 tablet (200 mg total) by mouth daily. Take one tablet by mouth in the morning  . atorvastatin (LIPITOR) 80 MG tablet take 1 tablet by mouth once daily  . benazepril (LOTENSIN) 10 MG tablet take 1 tablet by mouth once daily  . budesonide-formoterol (SYMBICORT) 160-4.5 MCG/ACT inhaler inhale 2 puffs by mouth every morning and 2 puffs every evening ; SEPARATE DOSES BY 12 HOURS  . busPIRone (BUSPAR) 15 MG tablet Take 1 tablet (15 mg total) by mouth 3 (three) times daily.  . carvedilol (COREG) 12.5 MG tablet take 1 tablet by mouth twice a day with meals  . cetirizine (ZYRTEC) 10 MG tablet Take 10 mg by mouth daily as needed for allergies.  . Choline Fenofibrate (FENOFIBRIC ACID) 135 MG CPDR take 1 capsule by mouth once daily  . COMBIVENT RESPIMAT 20-100 MCG/ACT AERS respimat inhale 1 puff by mouth four times a day for shortness of breath or wheezing  . escitalopram (LEXAPRO) 20 MG tablet Take 1 tablet (20 mg total) by mouth daily.  . fluticasone (FLONASE) 50 MCG/ACT  nasal spray Place 2 sprays into both nostrils daily.  . furosemide (LASIX) 40 MG tablet Take 0.5 tablets (20 mg total) by mouth daily.  [name] gabapentin (NEURONTIN) 100 MG capsule take 3 capsules by mouth twice a day  . Ipratropium-Albuterol (COMBIVENT RESPIMAT) 20-100 MCG/ACT AERS respimat Inhale 1 puff into the lungs every 6 (six) hours. Shortness of breath or wheezing  . nitroGLYCERIN (NITROSTAT) 0.4 MG SL tablet Place 1 tablet (0.4 mg total) under the tongue every 5 (five) minutes x 3 doses as needed for chest pain.  [name] omeprazole (  PRILOSEC) 20 MG capsule Take 20 mg by mouth 2 (two) times daily.  . Pancrelipase, Lip-Prot-Amyl, (CREON) 24000 units CPEP 1 po tid w/meals (Patient taking differently: 1 po tid w/meals as needed)  . promethazine-codeine (PHENERGAN WITH CODEINE) 6.25-10 MG/5ML syrup Take 5 mLs by mouth every 4 (four) hours as needed.  . rivaroxaban (XARELTO) 20 MG TABS tablet Take 1 tablet (20 mg total) by mouth daily with supper.  . tamsulosin (FLOMAX) 0.4 MG CAPS capsule take 1 capsule by mouth once daily after SUPPER  . tiotropium (SPIRIVA) 18 MCG inhalation capsule Place 1 capsule (18 mcg total) into inhaler and inhale daily.   No facility-administered encounter medications on file as of 01/06/2016.

## 2016-01-06 NOTE — Assessment & Plan Note (Signed)
Stable interval despite significant symptoms he's had in the past.  Plan: Continue Symbicort and Spiriva Flu shot today Counsel to quit smoking

## 2016-01-06 NOTE — Assessment & Plan Note (Signed)
Counseled at length to quit smoking today. 

## 2016-01-09 ENCOUNTER — Ambulatory Visit (HOSPITAL_COMMUNITY): Admission: RE | Admit: 2016-01-09 | Payer: Medicare Other | Source: Ambulatory Visit | Admitting: Pulmonary Disease

## 2016-01-09 ENCOUNTER — Ambulatory Visit (HOSPITAL_COMMUNITY)
Admission: RE | Admit: 2016-01-09 | Discharge: 2016-01-09 | Disposition: A | Discharge status: Home / Self Care | Payer: Medicare Other | Source: Ambulatory Visit | Attending: Pulmonary Disease | Admitting: Pulmonary Disease

## 2016-01-09 ENCOUNTER — Encounter (HOSPITAL_COMMUNITY): Payer: Self-pay | Admitting: Respiratory Therapy

## 2016-01-09 ENCOUNTER — Encounter (HOSPITAL_COMMUNITY)
Admission: RE | Disposition: A | Discharge status: Home / Self Care | Payer: Self-pay | Source: Ambulatory Visit | Attending: Pulmonary Disease

## 2016-01-09 ENCOUNTER — Encounter (HOSPITAL_COMMUNITY): Admission: RE | Payer: Self-pay | Source: Ambulatory Visit

## 2016-01-09 ENCOUNTER — Encounter (HOSPITAL_COMMUNITY): Payer: Medicare Other

## 2016-01-09 DIAGNOSIS — Z8249 Family history of ischemic heart disease and other diseases of the circulatory system: Secondary | ICD-10-CM | POA: Diagnosis not present

## 2016-01-09 DIAGNOSIS — Z85828 Personal history of other malignant neoplasm of skin: Secondary | ICD-10-CM | POA: Insufficient documentation

## 2016-01-09 DIAGNOSIS — I714 Abdominal aortic aneurysm, without rupture: Secondary | ICD-10-CM | POA: Insufficient documentation

## 2016-01-09 DIAGNOSIS — K449 Diaphragmatic hernia without obstruction or gangrene: Secondary | ICD-10-CM | POA: Insufficient documentation

## 2016-01-09 DIAGNOSIS — F17210 Nicotine dependence, cigarettes, uncomplicated: Secondary | ICD-10-CM | POA: Diagnosis not present

## 2016-01-09 DIAGNOSIS — I5022 Chronic systolic (congestive) heart failure: Secondary | ICD-10-CM | POA: Insufficient documentation

## 2016-01-09 DIAGNOSIS — R042 Hemoptysis: Secondary | ICD-10-CM

## 2016-01-09 DIAGNOSIS — Z823 Family history of stroke: Secondary | ICD-10-CM | POA: Diagnosis not present

## 2016-01-09 DIAGNOSIS — F419 Anxiety disorder, unspecified: Secondary | ICD-10-CM | POA: Insufficient documentation

## 2016-01-09 DIAGNOSIS — K219 Gastro-esophageal reflux disease without esophagitis: Secondary | ICD-10-CM | POA: Insufficient documentation

## 2016-01-09 DIAGNOSIS — I2510 Atherosclerotic heart disease of native coronary artery without angina pectoris: Secondary | ICD-10-CM | POA: Insufficient documentation

## 2016-01-09 DIAGNOSIS — Z95810 Presence of automatic (implantable) cardiac defibrillator: Secondary | ICD-10-CM | POA: Insufficient documentation

## 2016-01-09 DIAGNOSIS — N400 Enlarged prostate without lower urinary tract symptoms: Secondary | ICD-10-CM | POA: Diagnosis not present

## 2016-01-09 DIAGNOSIS — E785 Hyperlipidemia, unspecified: Secondary | ICD-10-CM | POA: Diagnosis not present

## 2016-01-09 DIAGNOSIS — I110 Hypertensive heart disease with heart failure: Secondary | ICD-10-CM | POA: Insufficient documentation

## 2016-01-09 DIAGNOSIS — I252 Old myocardial infarction: Secondary | ICD-10-CM | POA: Diagnosis not present

## 2016-01-09 DIAGNOSIS — R739 Hyperglycemia, unspecified: Secondary | ICD-10-CM | POA: Diagnosis not present

## 2016-01-09 DIAGNOSIS — J449 Chronic obstructive pulmonary disease, unspecified: Secondary | ICD-10-CM | POA: Diagnosis not present

## 2016-01-09 DIAGNOSIS — K5730 Diverticulosis of large intestine without perforation or abscess without bleeding: Secondary | ICD-10-CM | POA: Diagnosis not present

## 2016-01-09 DIAGNOSIS — I472 Ventricular tachycardia: Secondary | ICD-10-CM | POA: Diagnosis not present

## 2016-01-09 DIAGNOSIS — F329 Major depressive disorder, single episode, unspecified: Secondary | ICD-10-CM | POA: Diagnosis not present

## 2016-01-09 HISTORY — PX: VIDEO BRONCHOSCOPY: SHX5072

## 2016-01-09 SURGERY — VIDEO BRONCHOSCOPY WITHOUT FLUORO
Anesthesia: Moderate Sedation | Laterality: Bilateral

## 2016-01-09 MED ORDER — MIDAZOLAM HCL 10 MG/2ML IJ SOLN
INTRAMUSCULAR | Status: DC | PRN
  Administered 2016-01-09: 2 mg via INTRAVENOUS

## 2016-01-09 MED ORDER — LIDOCAINE HCL 2 % EX GEL
CUTANEOUS | Status: DC | PRN
  Administered 2016-01-09: 1

## 2016-01-09 MED ORDER — FENTANYL CITRATE (PF) 100 MCG/2ML IJ SOLN
INTRAMUSCULAR | Status: AC
  Filled 2016-01-09: qty 4

## 2016-01-09 MED ORDER — PHENYLEPHRINE HCL 0.25 % NA SOLN: 1.0000 | Freq: Four times a day (QID) | NASAL | Status: DC | PRN

## 2016-01-09 MED ORDER — LIDOCAINE HCL 2 % EX GEL
1.0000 | Freq: Once | CUTANEOUS | Status: DC
Start: 2016-01-09 — End: 2016-01-09

## 2016-01-09 MED ORDER — MIDAZOLAM HCL 5 MG/ML IJ SOLN
INTRAMUSCULAR | Status: AC
Start: 2016-01-09 — End: 2016-01-09
  Filled 2016-01-09: qty 2

## 2016-01-09 MED ORDER — PHENYLEPHRINE HCL 0.25 % NA SOLN
NASAL | Status: DC | PRN
  Administered 2016-01-09: 2 via NASAL

## 2016-01-09 MED ORDER — BUTAMBEN-TETRACAINE-BENZOCAINE 2-2-14 % EX AERO: 1.0000 | INHALATION_SPRAY | Freq: Once | CUTANEOUS | Status: DC

## 2016-01-09 MED ORDER — LIDOCAINE HCL 1 % IJ SOLN
INTRAMUSCULAR | Status: DC | PRN
  Administered 2016-01-09: 6 mL via RESPIRATORY_TRACT

## 2016-01-09 MED ORDER — FENTANYL CITRATE (PF) 100 MCG/2ML IJ SOLN
INTRAMUSCULAR | Status: DC | PRN
  Administered 2016-01-09: 50 ug via INTRAVENOUS

## 2016-01-09 MED ORDER — SODIUM CHLORIDE 0.9 % IV SOLN
INTRAVENOUS | Status: DC
  Administered 2016-01-09: 07:00:00 via INTRAVENOUS

## 2016-01-09 NOTE — Discharge Instructions (Signed)
Flexible Bronchoscopy, Care After These instructions give you information on caring for yourself after your procedure. Your doctor may also give you more specific instructions. Call your doctor if you have any problems or questions after your procedure. HOME CARE  Do not eat or drink anything for 2 hours after your procedure. If you try to eat or drink before the medicine wears off, food or drink could go into your lungs. You could also burn yourself.  After 2 hours have passed and when you can cough and gag normally, you may eat soft food and drink liquids slowly.  The day after the test, you may eat your normal diet.  You may do your normal activities.  Keep all doctor visits. GET HELP RIGHT AWAY IF:  You get more and more short of breath.  You get light-headed.  You feel like you are going to pass out (faint).  You have chest pain.  You have new problems that worry you.  You cough up more than a little blood.  You cough up more blood than before. MAKE SURE YOU:  Understand these instructions.  Will watch your condition.  Will get help right away if you are not doing well or get worse.   This information is not intended to replace advice given to you by your health care provider. Make sure you discuss any questions you have with your health care provider.   Document Released: 01/11/2009 Document Revised: 03/21/2013 Document Reviewed: 11/18/2012 Elsevier Interactive Patient Education 2016 [place] not eat or drink until after 0930 today 01/09/16

## 2016-01-09 NOTE — Op Note (Signed)
[facility] Cardiopulmonary Patient Name: Steven Guzman Procedure Date: 01/09/2016 MRN: [id number] Attending MD: Juanito Doom , MD Date of Birth: March 26, 1941 CSN: [id number] Age: 75 Admit Type: Outpatient Ethnicity: Not Hispanic or Latino Procedure:            Bronchoscopy Indications:          Hemoptysis with normal CXR Providers:            Nathaneil Canary B. Mcquaid, MD, Doris Cheadle RRT,RCP, Ashley Mariner RRT,RCP Referring MD:          Medicines:            Lidocaine 1% applied to cords 6 mL, Lidocaine 1%                        applied to the tracheobronchial tree 6 mL Complications:        No immediate complications Estimated Blood Loss: Estimated blood loss: none. Procedure:      Pre-Anesthesia Assessment:      - A History and Physical has been performed. Patient meds and allergies       have been reviewed. The risks and benefits of the procedure and the       sedation options and risks were discussed with the patient. All       questions were answered and informed consent was obtained. Patient       identification and proposed procedure were verified prior to the       procedure by the physician and the technician in the procedure room.       Mental Status Examination: alert and oriented. Airway Examination:       normal oropharyngeal airway. Respiratory Examination: clear to       auscultation. CV Examination: normal. ASA Grade Assessment: II - A       patient with mild systemic disease. After reviewing the risks and       benefits, the patient was deemed in satisfactory condition to undergo       the procedure. The anesthesia plan was to use moderate sedation /       analgesia (conscious sedation). Immediately prior to administration of       medications, the patient was re-assessed for adequacy to receive       sedatives. The heart rate, respiratory rate, oxygen saturations, blood       pressure, adequacy of pulmonary ventilation,  and response to care were       monitored throughout the procedure. The physical status of the patient       was re-assessed after the procedure.      After obtaining informed consent, the bronchoscope was passed under       direct vision. Throughout the procedure, the patient's blood pressure,       pulse, and oxygen saturations were monitored continuously. the [id number]       [id number] scope was introduced through the left nostril and advanced to       the tracheobronchial tree. The procedure was accomplished with ease. The       patient tolerated the procedure well. The total duration of the       procedure was 2 minutes. Findings:      The nasopharynx/oropharynx appears normal. The larynx appears normal.       The vocal  cords appear normal. The subglottic space is normal. The       trachea is of normal caliber. The carina is sharp. The tracheobronchial       tree was examined to at least the first subsegmental level. Bronchial       mucosa and anatomy are normal; there are no endobronchial lesions, and       no secretions. Impression:      - Hemoptysis with normal CXR      - The examination was normal.      - No specimens collected.      - The examination was normal. Moderate Sedation:      Moderate (conscious) sedation was personally administered by the       endoscopist. The following parameters were monitored: oxygen saturation,       heart rate, blood pressure, and response to care. Total physician       intraservice time was 5 minutes. Recommendation:      - Follow up with bronchoscopist as previously scheduled. Procedure Code(s):      --- Professional ---      [phone], Bronchoscopy, rigid or flexible, including fluoroscopic guidance,       when performed; diagnostic, with cell washing, when performed (separate       procedure) Diagnosis Code(s):      --- Professional ---      R04.2, Hemoptysis CPT copyright 2016 American Medical Association. All rights reserved. The codes  documented in this report are preliminary and upon coder review may  be revised to meet current compliance requirements. Norlene Campbell, MD Juanito Doom, MD 01/09/2016 7:33:31 AM This report has been signed electronically. Number of Addenda: 0 Scope In: 7:25:43 AM Scope Out: 7:27:34 AM

## 2016-01-09 NOTE — H&P (Signed)
[facility] H&P  HPI: Steven Guzman is my office patient and has been experiencing hemoptysis over the last few weeks.  See my office notes regarding this.  Past Medical History:  Diagnosis Date  . AICD (automatic cardioverter/defibrillator) present   . Aneurysm ([place])    abdominal <4. 3.6 cm on ultrasound 09/2014.   . Anginal pain ([place])   . Anxiety   . Basal cell carcinoma of nose    S/P MOHS  . Biliary acute pancreatitis   . CAD (coronary artery disease)    a. s/p MI in 1994 with PCI to LAD at that time b. cath 10/2012 demonstrated EF 30%, inferior akinesis with mild hypokinesis of all walls, patent LAD and RCA stents; ostial PDA with 80-90% obstruction with medical therapy recommended   . Chronic bronchitis ([place])    "was having it q yr; haven't had it in a couple years" (12/21/2014)  . Chronic systolic CHF (congestive heart failure) (H[facility])    EF 30 to 35 % as of 09/2014.   [name] Complication of anesthesia 10/2014   "had to have defibrillator w/ERCP"  . COPD (chronic obstructive pulmonary disease) ([place])    a. followed by pulmonary, COPD GOLD stage II  . Depression   . Diverticulosis of colon 07/2014   noted on CT  . GERD (gastroesophageal reflux disease)   . Hiatal hernia   . Hyperglycemia 10/2012.  [name] Hyperlipidemia   . Hypertension   . Myocardial infarction 1994; 2011  . Pneumonia 1946; 2015  . Prostate enlargement 07/2014   observed on CT  . Tobacco abuse   . Ventricular tachycardia ([place])    a. 08/2009 s/p BSX E110 Teligen 100 AICD, ser#: [id number];  b. 08/2008 VT req ATP - detection reprogrammed from 160 to 150. c. EPS and VT ablation by Dr. Lovena Le 12/21/2014     Family History  Problem Relation Age of Onset  . Heart attack Brother   . CAD Father   . Hypertension Father   . CAD Mother   . Hypertension Mother   . Hypertension Brother   . Stroke Neg Hx      Social History   Social History  . Marital status: Married    Spouse name: N/A  . Number of children: N/A  . Years of  education: N/A   Occupational History  . Retired    Social History Main Topics  . Smoking status: Current Every Day Smoker    Packs/day: 1.00    Years: 55.00    Types: Cigarettes  . Smokeless tobacco: Never Used     Comment: up to 1ppd  . Alcohol use 0.0 oz/week     Comment: 12/21/2014 "might have a couple glasses of wine/year"  . Drug use: No  . Sexual activity: Not Currently   Other Topics Concern  . Not on file   Social History Narrative  . No narrative on file     Allergies  Allergen Reactions  . Sulfa Antibiotics Hives     @encmedstart @  Vitals:   01/09/16 0645 01/09/16 0647 01/09/16 0650 01/09/16 0655  BP:    109/77  Resp: 13 15 18 13   Temp: 97.9 F (36.6 C)     TempSrc: Oral     SpO2: 96% 95% 94% 97%   Gen: well appearing HENT: OP clear,  neck supple PULM: CTA B, normal percussion CV: RRR, no mgr, trace edema GI: BS+, soft, nontender Derm: no cyanosis or rash Psyche: normal mood and affect  CT  chest this year> no mass or suspicious nodules  Impression/Plan: Smoker with hemoptysis: plan bronchoscopy with airway exam today History of CHF/VT, ICD in place  Roselie Awkward, MD [place] [facility] Pager: [phone] Cell: [phone] After 3pm or if no response, call [phone]

## 2016-01-09 NOTE — Progress Notes (Signed)
Video bronchoscopy performed No interventions Airway exam only Pt tolerated well  Kathie Dike RRT

## 2016-01-10 ENCOUNTER — Encounter (HOSPITAL_COMMUNITY): Payer: Self-pay | Admitting: Pulmonary Disease

## 2016-01-15 ENCOUNTER — Other Ambulatory Visit: Payer: Self-pay | Admitting: Internal Medicine

## 2016-01-16 ENCOUNTER — Encounter: Payer: Self-pay | Admitting: Internal Medicine

## 2016-01-20 ENCOUNTER — Ambulatory Visit (INDEPENDENT_AMBULATORY_CARE_PROVIDER_SITE_OTHER): Payer: Medicare Other | Admitting: Pulmonary Disease

## 2016-01-20 ENCOUNTER — Encounter: Payer: Self-pay | Admitting: Pulmonary Disease

## 2016-01-20 DIAGNOSIS — J449 Chronic obstructive pulmonary disease, unspecified: Secondary | ICD-10-CM | POA: Diagnosis not present

## 2016-01-20 DIAGNOSIS — I255 Ischemic cardiomyopathy: Secondary | ICD-10-CM

## 2016-01-20 DIAGNOSIS — R042 Hemoptysis: Secondary | ICD-10-CM | POA: Diagnosis not present

## 2016-01-20 NOTE — Patient Instructions (Addendum)
It is nice to meet you. Continue taking your Symbicort and Spiriva as you have been doing. Continue using your Combivent as you have been doing. Quit Smoking.  This is the single most powerful action you can take to decrease your risk of breathing problems, lung cancer, heart disease and stroke. Consider the reduce to quit we discussed in the office today. Follow up with Dr. Lake Bells in 4 months Please contact office for sooner follow up if symptoms do not improve or worsen or seek emergency care

## 2016-01-20 NOTE — Progress Notes (Signed)
Attending:  I have seen and examined the patient with Eric Form and I agree with the findings from her note  No further hemoptysis Note some dizziness when he stands up from a seated position No breathing complaints  On exam: Lungs clear to auscultation normal effort No edema in ankles  COPD: Continue current regimen, counseled at length to quit smoking again today Hemoptysis: Resolved Orthostasis: Advised to hold Lasix 1 day and then resume the following day. If this helps then he may want to go to a Monday Wednesday Friday regimen, will defer to cardiology.  Roselie Awkward, MD [place] [facility] Pager: [phone] Cell: [phone] After 3pm or if no response, call [phone]

## 2016-01-20 NOTE — Assessment & Plan Note (Signed)
Stable on Medications Continues to smoke 1 PPD Plan: Continue taking your Symbicort and Spiriva as you have been doing. Continue using your Combivent as you have been doing. Quit Smoking.  This is the single most powerful action you can take to decrease your risk of breathing problems, lung cancer, heart disease and stroke. Consider the reduce to quit we discussed in the office today. Follow up with Dr. Lake Bells in 4 months Please contact office for sooner follow up if symptoms do not improve or worsen or seek emergency care

## 2016-01-20 NOTE — Assessment & Plan Note (Addendum)
Stable post bronch 10/12 No Further Hemoptysis No lesion per bronch At baseline in regard to pulmonary status Plan: Quit smoking Advised to call for any additional hemoptysis

## 2016-01-20 NOTE — Progress Notes (Signed)
History of Present Illness Steven Guzman is a 75 y.o. male with Gold Grade C COPD  Synopsis: GOLD Grade C COPD, still actively smoking as of November 2015 10/2013 PFT> ratio 61%, FEV1 2.53L (77% pred, 15% change), TLC 8.65 L (117% pred), RV 4.25L (161% pred), DLCO 25.39 (73% pred)  01/20/2016 Follow up OV Post Bronch 01/09/2016 Pt. Presents to the office for follow up after having Bronchoscopy on 10/12. He states he is doing well. He had one episode of hemoptysis after the procedure, but has had none since. He is compliant with his Symbicort and Spiriva  for his COPD.Marland KitchenHe is also using his Combivent  As needed . He uses it at least 20 times a week with exertion.He is continuing to smoke about 1 PPD. We discussed smoking cessation. He states that he and his wife both smoke, and he did take Chantix which did not work. I have given him the " Be stronger than your excuses" card. We discussed really committing to a quit date.He denies chest pain, fever, orthopnea, or any further hemoptysis. He has had the flu shot.  Tests Bronchoscopy>> 01/09/2016 No lesion noted  Past medical hx Past Medical History:  Diagnosis Date  . AICD (automatic cardioverter/defibrillator) present   . Aneurysm ([place])    abdominal <4. 3.6 cm on ultrasound 09/2014.   . Anginal pain ([place])   . Anxiety   . Basal cell carcinoma of nose    S/P MOHS  . Biliary acute pancreatitis   . CAD (coronary artery disease)    a. s/p MI in 1994 with PCI to LAD at that time b. cath 10/2012 demonstrated EF 30%, inferior akinesis with mild hypokinesis of all walls, patent LAD and RCA stents; ostial PDA with 80-90% obstruction with medical therapy recommended   . Chronic bronchitis ([place])    "was having it q yr; haven't had it in a couple years" (12/21/2014)  . Chronic systolic CHF (congestive heart failure) (HCC)    EF 30 to 35 % as of 09/2014.   [name] Complication of anesthesia 10/2014   "had to have defibrillator w/ERCP"  . COPD (chronic  obstructive pulmonary disease) ([place])    a. followed by pulmonary, COPD GOLD stage II  . Depression   . Diverticulosis of colon 07/2014   noted on CT  . GERD (gastroesophageal reflux disease)   . Hiatal hernia   . Hyperglycemia 10/2012.  [name] Hyperlipidemia   . Hypertension   . Myocardial infarction 1994; 2011  . Pneumonia 1946; 2015  . Prostate enlargement 07/2014   observed on CT  . Tobacco abuse   . Ventricular tachycardia ([place])    a. 08/2009 s/p BSX E110 Teligen 100 AICD, ser#: [id number];  b. 08/2008 VT req ATP - detection reprogrammed from 160 to 150. c. EPS and VT ablation by Dr. Lovena Le 12/21/2014     Past surgical hx, Family hx, Social hx all reviewed.  Current Outpatient Prescriptions on File Prior to Visit  Medication Sig  . amiodarone (PACERONE) 200 MG tablet Take 1 tablet (200 mg total) by mouth daily. Take one tablet by mouth in the morning  . atorvastatin (LIPITOR) 80 MG tablet take 1 tablet by mouth once daily  . benazepril (LOTENSIN) 10 MG tablet take 1 tablet by mouth once daily  . budesonide-formoterol (SYMBICORT) 160-4.5 MCG/ACT inhaler inhale 2 puffs by mouth every morning and 2 puffs every evening ; SEPARATE DOSES BY 12 HOURS  . busPIRone (BUSPAR) 15 MG tablet Take 1 tablet (  15 mg total) by mouth 3 (three) times daily.  . carvedilol (COREG) 12.5 MG tablet take 1 tablet by mouth twice a day with meals  . cetirizine (ZYRTEC) 10 MG tablet Take 10 mg by mouth daily as needed for allergies.  . Choline Fenofibrate (FENOFIBRIC ACID) 135 MG CPDR take 1 capsule by mouth once daily  . COMBIVENT RESPIMAT 20-100 MCG/ACT AERS respimat inhale 1 puff by mouth four times a day for shortness of breath or wheezing  . escitalopram (LEXAPRO) 20 MG tablet take 1 tablet by mouth once daily  . fluticasone (FLONASE) 50 MCG/ACT nasal spray Place 2 sprays into both nostrils daily.  . furosemide (LASIX) 40 MG tablet Take 0.5 tablets (20 mg total) by mouth daily.  [name] gabapentin (NEURONTIN) 100 MG  capsule take 3 capsules by mouth twice a day  . Ipratropium-Albuterol (COMBIVENT RESPIMAT) 20-100 MCG/ACT AERS respimat Inhale 1 puff into the lungs every 6 (six) hours. Shortness of breath or wheezing  . nitroGLYCERIN (NITROSTAT) 0.4 MG SL tablet Place 1 tablet (0.4 mg total) under the tongue every 5 (five) minutes x 3 doses as needed for chest pain.  [name] omeprazole (PRILOSEC) 20 MG capsule Take 20 mg by mouth 2 (two) times daily.  . Pancrelipase, Lip-Prot-Amyl, (CREON) 24000 units CPEP 1 po tid w/meals (Patient taking differently: 1 po tid w/meals as needed)  . promethazine-codeine (PHENERGAN WITH CODEINE) 6.25-10 MG/5ML syrup Take 5 mLs by mouth every 4 (four) hours as needed.  . rivaroxaban (XARELTO) 20 MG TABS tablet Take 1 tablet (20 mg total) by mouth daily with supper.  . tamsulosin (FLOMAX) 0.4 MG CAPS capsule take 1 capsule by mouth once daily after SUPPER  . tiotropium (SPIRIVA) 18 MCG inhalation capsule Place 1 capsule (18 mcg total) into inhaler and inhale daily.   No current facility-administered medications on file prior to visit.      Allergies  Allergen Reactions  . Sulfa Antibiotics Hives    Review Of Systems:  Constitutional:   No  weight loss, night sweats,  Fevers, chills, fatigue, or  lassitude.  HEENT:   No headaches,  Difficulty swallowing,  Tooth/dental problems, or  Sore throat,                No sneezing, itching, ear ache, nasal congestion, post nasal drip,   CV:  No chest pain,  Orthopnea, PND, swelling in lower extremities, anasarca, dizziness, palpitations, syncope.   GI  No heartburn, indigestion, abdominal pain, nausea, vomiting, diarrhea, change in bowel habits, loss of appetite, bloody stools.   Resp: + shortness of breath with exertion less at rest.  No excess mucus, no productive cough,  No non-productive cough,  No coughing up of blood.  No change in color of mucus.  + wheezing.  No chest wall deformity  Skin: no rash or lesions.  GU: no dysuria,  change in color of urine, no urgency or frequency.  No flank pain, no hematuria   MS:  No joint pain or swelling.  No decreased range of motion.  No back pain.  Psych:  No change in mood or affect. No depression or anxiety.  No memory loss.   Vital Signs BP (!) 100/58 (BP Location: Left Arm, Cuff Size: Normal)   Pulse (!) 59   Ht 6' (1.829 m)   Wt 227 lb (103 kg)   SpO2 95%   BMI 30.79 kg/m    Physical Exam:  General- No distress,  A&Ox3, pleasant ENT: No sinus tenderness, TM  clear, pale nasal mucosa, no oral exudate,no post nasal drip, no LAN Cardiac: S1, S2, regular rate and rhythm, no murmur Chest: + Exp. wheeze/ no rales/ dullness; no accessory muscle use, no nasal flaring, no sternal retractions Abd.: Soft Non-tender Ext: No clubbing cyanosis, edema Neuro:  normal strength Skin: No rashes, warm and dry Psych: normal mood and behavior   Assessment/Plan  COPD GOLD GRADE C Stable on Medications Continues to smoke 1 PPD Plan: Continue taking your Symbicort and Spiriva as you have been doing. Continue using your Combivent as you have been doing. Quit Smoking.  This is the single most powerful action you can take to decrease your risk of breathing problems, lung cancer, heart disease and stroke. Consider the reduce to quit we discussed in the office today. Follow up with Dr. Lake Bells in 4 months Please contact office for sooner follow up if symptoms do not improve or worsen or seek emergency care    Hemoptysis Stable post bronch 10/12 No Further Hemoptysis No lesion per bronch At baseline in regard to pulmonary status Plan: Quit smoking Advised to call for any additional hemoptysis    Magdalen Spatz, NP 01/20/2016  2:34 PM

## 2016-01-27 ENCOUNTER — Encounter: Payer: Self-pay | Admitting: Internal Medicine

## 2016-02-04 DIAGNOSIS — K219 Gastro-esophageal reflux disease without esophagitis: Secondary | ICD-10-CM | POA: Diagnosis not present

## 2016-02-04 DIAGNOSIS — R490 Dysphonia: Secondary | ICD-10-CM | POA: Diagnosis not present

## 2016-02-17 ENCOUNTER — Encounter: Payer: Self-pay | Admitting: Internal Medicine

## 2016-02-17 ENCOUNTER — Ambulatory Visit (INDEPENDENT_AMBULATORY_CARE_PROVIDER_SITE_OTHER): Payer: Medicare Other | Admitting: Internal Medicine

## 2016-02-17 DIAGNOSIS — F324 Major depressive disorder, single episode, in partial remission: Secondary | ICD-10-CM | POA: Diagnosis not present

## 2016-02-17 DIAGNOSIS — I255 Ischemic cardiomyopathy: Secondary | ICD-10-CM

## 2016-02-17 MED ORDER — BUPROPION HCL 75 MG PO TABS: 75.0000 mg | ORAL_TABLET | Freq: Every day | ORAL | 6 refills | Status: DC

## 2016-02-17 NOTE — Progress Notes (Signed)
   Subjective:    Patient ID: Steven Guzman, male    DOB: Dec 12, 1940, 75 y.o.   MRN: [id number]  HPI The patient is a 75 YO man coming in for follow up of his depression. He is taking the lexapro daily and feels that it is doing okay for the depression. He is still having some flares of anger at times which he feels are not appropriate. He has heard that wellbutrin can help with that. He denies sleeping problems, SI/HI. He is overall improved from starting lexapro. He is still smoking and wants to quit but his wife is having some significant health problems which is keeping them both from quitting.   Review of Systems  Constitutional: Negative for activity change, appetite change, fatigue, fever and unexpected weight change.  Respiratory: Negative.   Cardiovascular: Negative.   Gastrointestinal: Negative.   Musculoskeletal: Negative.   Skin: Negative.   Neurological: Negative.   Psychiatric/Behavioral: Positive for agitation and behavioral problems. Negative for confusion, decreased concentration, dysphoric mood, hallucinations, self-injury, sleep disturbance and suicidal ideas. The patient is not nervous/anxious and is not hyperactive.       Objective:   Physical Exam  Constitutional: He is oriented to person, place, and time. He appears well-developed and well-nourished.  HENT:  Head: Normocephalic and atraumatic.  Eyes: EOM are normal.  Neck: Normal range of motion.  Cardiovascular: Normal rate and regular rhythm.   Pulmonary/Chest: Effort normal and breath sounds normal. No respiratory distress. He has no wheezes. He has no rales.  Abdominal: Soft. He exhibits no distension. There is no tenderness. There is no rebound.  Musculoskeletal: He exhibits no edema.  Neurological: He is alert and oriented to person, place, and time. Coordination normal.  Skin: Skin is warm and dry.  Psychiatric: He has a normal mood and affect.   Vitals:   02/17/16 1425  BP: (!) 100/58  Pulse: 60    Resp: 18  Temp: 97.7 F (36.5 C)  TempSrc: Oral  SpO2: 92%  Weight: 225 lb (102.1 kg)  Height: 5\' 11"  (1.803 m)      Assessment & Plan:

## 2016-02-17 NOTE — Patient Instructions (Signed)
We have sent in the wellbutrin to take daily in addition to the lexapro.

## 2016-02-17 NOTE — Progress Notes (Signed)
Pre visit review using our clinic review tool, if applicable. No additional management support is needed unless otherwise documented below in the visit note. 

## 2016-02-18 NOTE — Assessment & Plan Note (Signed)
Taking lexapro and will add low dose daily wellbutrin to see if this can help some with the volatility in his mood during the day. Sleeping well and overall satisfied with the depression control.

## 2016-02-19 ENCOUNTER — Ambulatory Visit: Payer: Medicare Other | Admitting: Interventional Cardiology

## 2016-03-08 ENCOUNTER — Other Ambulatory Visit: Payer: Self-pay | Admitting: Internal Medicine

## 2016-03-23 DIAGNOSIS — R0602 Shortness of breath: Secondary | ICD-10-CM | POA: Diagnosis not present

## 2016-03-26 ENCOUNTER — Other Ambulatory Visit: Payer: Self-pay | Admitting: Pulmonary Disease

## 2016-04-06 ENCOUNTER — Other Ambulatory Visit: Payer: Self-pay | Admitting: Internal Medicine

## 2016-04-06 DIAGNOSIS — I480 Paroxysmal atrial fibrillation: Secondary | ICD-10-CM

## 2016-04-07 NOTE — Telephone Encounter (Signed)
Labs 08/02/15 creat 1.17/wt 102.1 02/17/16/ CrCl 76.36, last seen 07/01/15 Dr Tamala Julian, dose appropriate, OK refill x 6 months.

## 2016-04-28 NOTE — Progress Notes (Signed)
Cardiology Office Note    Date:  04/29/2016   ID:  Steven, Guzman 07/14/1940, MRN [id number]  PCP:  Hoyt Koch, MD  Cardiologist: Sinclair Grooms, MD   Chief Complaint  Patient presents with  . Coronary Artery Disease  . Congestive Heart Failure    History of Present Illness:  Steven Guzman is a 76 y.o. male  who presents for Ischemic cardiomyopathy with EF 35%, COPD, hypertension, recent low blood pressures, CAD with prior coronary bypass grafting, continued tobacco abuse, ventricular tachycardia, amiodarone use, and implantable cardiac defibrillator.  Just had AICD interrogation.  He is on chronic amiodarone therapy and guide line base medical therapy for systolic heart failure. He continues to smoke cigarettes. He denies exertional chest discomfort. He occasionally gets interscapular pain that radiates through to the front of his chest. Sublingual nitroglycerin relieves the discomfort in about 5 minutes. He has occasional orthopnea. Is no peripheral edema.  Past Medical History:  Diagnosis Date  . AICD (automatic cardioverter/defibrillator) present   . Aneurysm ([place])    abdominal <4. 3.6 cm on ultrasound 09/2014.   . Anginal pain ([place])   . Anxiety   . Basal cell carcinoma of nose    S/P MOHS  . Biliary acute pancreatitis   . CAD (coronary artery disease)    a. s/p MI in 1994 with PCI to LAD at that time b. cath 10/2012 demonstrated EF 30%, inferior akinesis with mild hypokinesis of all walls, patent LAD and RCA stents; ostial PDA with 80-90% obstruction with medical therapy recommended   . Chronic bronchitis ([place])    "was having it q yr; haven't had it in a couple years" (12/21/2014)  . Chronic systolic CHF (congestive heart failure) (HCC)    EF 30 to 35 % as of 09/2014.   [name] Complication of anesthesia 10/2014   "had to have defibrillator w/ERCP"  . COPD (chronic obstructive pulmonary disease) ([place])    a. followed by pulmonary, COPD GOLD stage II  .  Depression   . Diverticulosis of colon 07/2014   noted on CT  . GERD (gastroesophageal reflux disease)   . Hiatal hernia   . Hyperglycemia 10/2012.  [name] Hyperlipidemia   . Hypertension   . Myocardial infarction 1994; 2011  . Pneumonia 1946; 2015  . Prostate enlargement 07/2014   observed on CT  . Tobacco abuse   . Ventricular tachycardia ([place])    a. 08/2009 s/p BSX E110 Teligen 100 AICD, ser#: [id number];  b. 08/2008 VT req ATP - detection reprogrammed from 160 to 150. c. EPS and VT ablation by Dr. Lovena Le 12/21/2014    Past Surgical History:  Procedure Laterality Date  . CATARACT EXTRACTION W/ INTRAOCULAR LENS  IMPLANT, BILATERAL Bilateral ~ 2011  . COLONOSCOPY    . ELECTROPHYSIOLOGIC STUDY N/A 12/21/2014   Procedure: V Tach Ablation;  Surgeon: [name], MD;  Location: [facility];  Service: Cardiovascular;  Laterality: N/A;  . ERCP N/A 11/16/2014   Procedure: ENDOSCOPIC RETROGRADE CHOLANGIOPANCREATOGRAPHY (ERCP);  Surgeon: [name], MD;  Location: [facility];  Service: Endoscopy;  Laterality: N/A;  . EYE SURGERY    . FOOT SURGERY Left 2005   "fixed bone that stuck out in my ankle area"  . HEMORRHOID BANDING    . IMPLANTABLE CARDIOVERTER DEFIBRILLATOR IMPLANT  09/06/09   BSX dual chamber ICD implanted in [place] for cardiac arrest 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  . INGUINAL HERNIA REPAIR Right ~ 1995  .  LEFT HEART CATHETERIZATION WITH CORONARY ANGIOGRAM N/A 11/25/2012   demonstrated EF 30%, inferior akinesis with mild hypokinesis of all walls, patent LAD and RCA stents; ostial PDA with 80-90% obstruction with medical therapy recommended  . MOHS SURGERY  2008   nose, skin graft  . RETINAL DETACHMENT SURGERY Right 2013  . TENOLYSIS Right 12/21/2013   Procedure: TENOLYSIS FLEXOR CARPI RADIALIS ,DEBRIDEMENT RIGHT JOINT WRIST,DEBRIDEMENT SCAPHOTRAPEZIAL TRAPEZOID, REPAIR OF EXTENSOR HOOD;  Surgeon: Daryll Brod, MD;  Location: [facility];  Service: Orthopedics;   Laterality: Right;  . V-TACH ABLATION  12/21/2014  . VIDEO BRONCHOSCOPY Bilateral 01/09/2016   Procedure: VIDEO BRONCHOSCOPY WITHOUT FLUORO;  Surgeon: Juanito Doom, MD;  Location: [facility];  Service: Cardiopulmonary;  Laterality: Bilateral;    Current Medications: Outpatient Medications Prior to Visit  Medication Sig Dispense Refill  . amiodarone (PACERONE) 200 MG tablet Take 1 tablet (200 mg total) by mouth daily. Take one tablet by mouth in the morning 30 tablet 9  . atorvastatin (LIPITOR) 80 MG tablet take 1 tablet by mouth once daily 30 tablet 9  . benazepril (LOTENSIN) 10 MG tablet take 1 tablet by mouth once daily 90 tablet 2  . budesonide-formoterol (SYMBICORT) 160-4.5 MCG/ACT inhaler inhale 2 puffs by mouth every morning and 2 puffs every evening ; SEPARATE DOSES BY 12 HOURS 10.2 Inhaler 5  . buPROPion (WELLBUTRIN) 75 MG tablet Take 1 tablet (75 mg total) by mouth daily. 30 tablet 6  . busPIRone (BUSPAR) 15 MG tablet Take 1 tablet (15 mg total) by mouth 3 (three) times daily. 90 tablet 5  . carvedilol (COREG) 12.5 MG tablet take 1 tablet by mouth twice a day with meals 180 tablet 2  . cetirizine (ZYRTEC) 10 MG tablet Take 10 mg by mouth daily as needed for allergies.    . Choline Fenofibrate (FENOFIBRIC ACID) 135 MG CPDR take 1 capsule by mouth once daily 30 capsule 9  . COMBIVENT RESPIMAT 20-100 MCG/ACT AERS respimat inhale 1 puff by mouth four times a day for shortness of breath or wheezing 1 Inhaler 1  . escitalopram (LEXAPRO) 20 MG tablet take 1 tablet by mouth once daily 30 tablet 6  . fluticasone (FLONASE) 50 MCG/ACT nasal spray Place 2 sprays into both nostrils daily.    . furosemide (LASIX) 40 MG tablet Take 0.5 tablets (20 mg total) by mouth daily. 15 tablet 9  . gabapentin (NEURONTIN) 100 MG capsule take 3 capsules by mouth twice a day 540 capsule 2  . Ipratropium-Albuterol (COMBIVENT RESPIMAT) 20-100 MCG/ACT AERS respimat Inhale 1 puff into the lungs every 6 (six)  hours. Shortness of breath or wheezing    . nitroGLYCERIN (NITROSTAT) 0.4 MG SL tablet Place 1 tablet (0.4 mg total) under the tongue every 5 (five) minutes x 3 doses as needed for chest pain. 25 tablet 11  . omeprazole (PRILOSEC) 20 MG capsule Take 20 mg by mouth 2 (two) times daily.    . Pancrelipase, Lip-Prot-Amyl, (CREON) 24000 units CPEP 1 po tid w/meals (Patient taking differently: 1 po tid w/meals as needed) 90 capsule 3  . promethazine-codeine (PHENERGAN WITH CODEINE) 6.25-10 MG/5ML syrup Take 5 mLs by mouth every 4 (four) hours as needed. 300 mL 0  . SPIRIVA HANDIHALER 18 MCG inhalation capsule inhale the contents of one capsule in the handihaler once daily 30 capsule 5  . tamsulosin (FLOMAX) 0.4 MG CAPS capsule take 1 capsule by mouth once daily AFTER SUPPER 30 capsule 1  . XARELTO 20 MG TABS  tablet take 1 tablet by mouth once daily with SUPPER 30 tablet 6   No facility-administered medications prior to visit.      Allergies:   Sulfa antibiotics   Social History   Social History  . Marital status: Married    Spouse name: N/A  . Number of children: N/A  . Years of education: N/A   Occupational History  . Retired    Social History Main Topics  . Smoking status: Current Every Day Smoker    Packs/day: 1.00    Years: 55.00    Types: Cigarettes  . Smokeless tobacco: Never Used     Comment: up to 1ppd  . Alcohol use 0.0 oz/week     Comment: 12/21/2014 "might have a couple glasses of wine/year"  . Drug use: No  . Sexual activity: Not Currently   Other Topics Concern  . Not on file   Social History Narrative  . No narrative on file     Family History:  The patient's family history includes CAD in his father and mother; Heart attack in his brother; Hypertension in his brother, father, and mother.   ROS:   Please see the history of present illness.    Chronic shortness of breath, cough, wheezing, phlegm production, back discomfort, dizziness, easy bruising.  All other  systems reviewed and are negative.   PHYSICAL EXAM:   VS:  BP 96/64   Pulse 60   Ht 5\' 11"  (1.803 m)   Wt 225 lb (102.1 kg)   BMI 31.38 kg/m    GEN: Well nourished, well developed, in no acute distress . Still smell of cigarette smoke. HEENT: normal  Neck: no JVD, carotid bruits, or masses Cardiac: RRR; no murmurs, rubs, or gallops,no edema  Respiratory:  clear to auscultation bilaterally, normal work of breathing GI: soft, nontender, nondistended, + BS MS: no deformity or atrophy  Skin: warm and dry, no rash Neuro:  Alert and Oriented x 3, Strength and sensation are intact Psych: euthymic mood, full affect  Wt Readings from Last 3 Encounters:  04/29/16 225 lb (102.1 kg)  02/17/16 225 lb (102.1 kg)  01/20/16 227 lb (103 kg)      Studies/Labs Reviewed:   EKG:  EKG  Performed in May 2017 demonstrates sinus rhythm with interventricular conduction delay.  Recent Labs: 07/01/2015: TSH 1.85 08/02/2015: ALT 33; BUN 13; Creatinine, Ser 1.17; Hemoglobin 14.7; Platelets 220.0; Potassium 4.1; Sodium 137   Lipid Panel    Component Value Date/Time   CHOL 150 11/25/2012 0330   TRIG 98 11/25/2012 0330   HDL 43 11/25/2012 0330   CHOLHDL 3.5 11/25/2012 0330   VLDL 20 11/25/2012 0330   LDLCALC 87 11/25/2012 0330    Additional studies/ records that were reviewed today include:  Telemetry monitoring from device demonstrates paroxysmal of atrial fibrillation lasting up to 45 minutes. Currently in sinus rhythm. Interrogation done prior to today's office visit.    ASSESSMENT:    1. Coronary artery disease involving native coronary artery of native heart without angina pectoris   2. Paroxysmal atrial fibrillation (HCC)   3. Chronic systolic heart failure ([place])   4. Essential hypertension   5. Ventricular tachycardia ([place])   6. ICD (implantable cardioverter-defibrillator) in place   7. Other hyperlipidemia   8. Long term current use of amiodarone      PLAN:  In order of problems  listed above:  1. He is stable with reference to CAD and angina. He gets relief from sublingual nitroglycerin.  Plan no change in current management strategy unless acceleration in anginal complaints. 2. His own 200 mg of amiodarone daily to help maintain sinus rhythm. As noted above despite amiodarone he has intermittent atrial fibrillation longest episodes lasting up to 45 minutes. Plan is to continue attempts at rhythm control to avoid acute on chronic systolic and diastolic heart failure. 3. No evidence of volume overload. Try to maintain sinus rhythm as much as possible as this helps to prevent acute decompensation. 2 g sodium diet. 4. 2 g sodium diet. Blood pressure is not a current issue. 5. Also been suppressed by amiodarone. 6. Device was interrogated today. No episodes of AICD treatment of ventricular arrhythmias. No shocks. 7. Not addressed 8. TSH, comprehensive metabolic panel, and PA and lateral chest x-ray. Six-month follow-up.    Medication Adjustments/Labs and Tests Ordered: Current medicines are reviewed at length with the patient today.  Concerns regarding medicines are outlined above.  Medication changes, Labs and Tests ordered today are listed in the Patient Instructions below. There are no Patient Instructions on file for this visit.   Signed, Sinclair Grooms, MD  04/29/2016 3:42 PM    [facility] [place], [place], [place]  [address] Phone: [phone]; Fax: [phone]

## 2016-04-29 ENCOUNTER — Ambulatory Visit (INDEPENDENT_AMBULATORY_CARE_PROVIDER_SITE_OTHER): Payer: Medicare Other | Admitting: Interventional Cardiology

## 2016-04-29 ENCOUNTER — Ambulatory Visit (INDEPENDENT_AMBULATORY_CARE_PROVIDER_SITE_OTHER): Payer: Medicare Other | Admitting: *Deleted

## 2016-04-29 ENCOUNTER — Encounter: Payer: Self-pay | Admitting: Interventional Cardiology

## 2016-04-29 VITALS — BP 96/64 | HR 60 | Ht 71.0 in | Wt 225.0 lb

## 2016-04-29 DIAGNOSIS — Z95810 Presence of automatic (implantable) cardiac defibrillator: Secondary | ICD-10-CM

## 2016-04-29 DIAGNOSIS — Z79899 Other long term (current) drug therapy: Secondary | ICD-10-CM | POA: Diagnosis not present

## 2016-04-29 DIAGNOSIS — I480 Paroxysmal atrial fibrillation: Secondary | ICD-10-CM

## 2016-04-29 DIAGNOSIS — I5022 Chronic systolic (congestive) heart failure: Secondary | ICD-10-CM

## 2016-04-29 DIAGNOSIS — E784 Other hyperlipidemia: Secondary | ICD-10-CM

## 2016-04-29 DIAGNOSIS — I472 Ventricular tachycardia, unspecified: Secondary | ICD-10-CM

## 2016-04-29 DIAGNOSIS — E7849 Other hyperlipidemia: Secondary | ICD-10-CM

## 2016-04-29 DIAGNOSIS — I2510 Atherosclerotic heart disease of native coronary artery without angina pectoris: Secondary | ICD-10-CM | POA: Diagnosis not present

## 2016-04-29 DIAGNOSIS — I255 Ischemic cardiomyopathy: Secondary | ICD-10-CM

## 2016-04-29 DIAGNOSIS — I10 Essential (primary) hypertension: Secondary | ICD-10-CM

## 2016-04-29 LAB — CUP PACEART INCLINIC DEVICE CHECK
Brady Statistic RA Percent Paced: 91 %
Brady Statistic RV Percent Paced: 2 %
HIGH POWER IMPEDANCE MEASURED VALUE: 43 Ohm
HIGH POWER IMPEDANCE MEASURED VALUE: 55 Ohm
Implantable Lead Implant Date: 20110610
Implantable Lead Implant Date: 20110610
Implantable Lead Location: 753859
Implantable Lead Model: 4135
Implantable Lead Serial Number: 28681386
Lead Channel Impedance Value: 467 Ohm
Lead Channel Impedance Value: 630 Ohm
Lead Channel Pacing Threshold Amplitude: 0.9 V
Lead Channel Pacing Threshold Pulse Width: 0.4 ms
Lead Channel Pacing Threshold Pulse Width: 0.4 ms
Lead Channel Sensing Intrinsic Amplitude: 2.3 mV
Lead Channel Setting Pacing Amplitude: 2 V
MDC IDC LEAD LOCATION: 753860
MDC IDC LEAD SERIAL: 339643
MDC IDC MSMT LEADCHNL RV PACING THRESHOLD AMPLITUDE: 0.9 V
MDC IDC MSMT LEADCHNL RV SENSING INTR AMPL: 10 mV
MDC IDC PG IMPLANT DT: 20110610
MDC IDC SESS DTM: 20180131050000
MDC IDC SET LEADCHNL RV PACING AMPLITUDE: 2 V
MDC IDC SET LEADCHNL RV PACING PULSEWIDTH: 0.4 ms
MDC IDC SET LEADCHNL RV SENSING SENSITIVITY: 0.6 mV
Pulse Gen Serial Number: 164892

## 2016-04-29 NOTE — Progress Notes (Signed)
ICD check in clinic. Normal device function. Thresholds and sensing consistent with previous device measurements. Impedance trends stable over time. No evidence of any ventricular arrhythmias. <1% AT/AF burden, total time 9.5hrs, +Xarelto. Histogram distribution appropriate for patient and level of activity. No changes made this session. Device programmed at appropriate safety margins. Device programmed to optimize intrinsic conduction. Estimated longevity 5.5 years. Patient education completed including shock plan. Will enroll patient in [employer] NXT monitoring and order monitor with internet adapter. ROV with GT on 05/01/16.

## 2016-04-29 NOTE — Patient Instructions (Signed)
Medication Instructions:  None  Labwork: TSH and CMET today  Testing/Procedures: A chest x-ray takes a picture of the organs and structures inside the chest, including the heart, lungs, and blood vessels. This test can show several things, including, whether the heart is enlarges; whether fluid is building up in the lungs; and whether pacemaker / defibrillator leads are still in place.   Follow-Up: Your physician recommends that you schedule a follow-up appointment next available with Dr. Lovena Le.  Your physician wants you to follow-up in: 6 months with Dr. Tamala Julian. You will receive a reminder letter in the mail two months in advance. If you don't receive a letter, please call our office to schedule the follow-up appointment.    Any Other Special Instructions Will Be Listed Below (If Applicable).  Raquel Sarna or Joey will call you in regards to getting you set up for home monitoring.    If you need a refill on your cardiac medications before your next appointment, please call your pharmacy.

## 2016-04-30 LAB — COMPREHENSIVE METABOLIC PANEL
ALT: 22 IU/L (ref 0–44)
AST: 22 IU/L (ref 0–40)
Albumin/Globulin Ratio: 2 (ref 1.2–2.2)
Albumin: 4.3 g/dL (ref 3.5–4.8)
Alkaline Phosphatase: 39 IU/L (ref 39–117)
BILIRUBIN TOTAL: 0.6 mg/dL (ref 0.0–1.2)
BUN/Creatinine Ratio: 9 — ABNORMAL LOW (ref 10–24)
BUN: 11 mg/dL (ref 8–27)
CHLORIDE: 99 mmol/L (ref 96–106)
CO2: 24 mmol/L (ref 18–29)
CREATININE: 1.29 mg/dL — AB (ref 0.76–1.27)
Calcium: 8.8 mg/dL (ref 8.6–10.2)
GFR calc non Af Amer: 53 mL/min/{1.73_m2} — ABNORMAL LOW (ref >59)
GFR, EST AFRICAN AMERICAN: 61 mL/min/{1.73_m2} (ref >59)
Globulin, Total: 2.1 g/dL (ref 1.5–4.5)
Glucose: 77 mg/dL (ref 65–99)
Potassium: 4.4 mmol/L (ref 3.5–5.2)
Sodium: 138 mmol/L (ref 134–144)
TOTAL PROTEIN: 6.4 g/dL (ref 6.0–8.5)

## 2016-04-30 LAB — TSH: TSH: 2.66 u[IU]/mL (ref 0.450–4.500)

## 2016-05-01 ENCOUNTER — Encounter: Payer: Medicare Other | Admitting: Internal Medicine

## 2016-05-01 DIAGNOSIS — R0682 Tachypnea, not elsewhere classified: Secondary | ICD-10-CM | POA: Diagnosis not present

## 2016-05-07 ENCOUNTER — Encounter: Payer: Self-pay | Admitting: Acute Care

## 2016-05-07 ENCOUNTER — Ambulatory Visit (INDEPENDENT_AMBULATORY_CARE_PROVIDER_SITE_OTHER): Payer: Medicare Other | Admitting: Acute Care

## 2016-05-07 ENCOUNTER — Ambulatory Visit (INDEPENDENT_AMBULATORY_CARE_PROVIDER_SITE_OTHER)
Admission: RE | Admit: 2016-05-07 | Discharge: 2016-05-07 | Disposition: A | Discharge status: Home / Self Care | Payer: Medicare Other | Source: Ambulatory Visit | Attending: Acute Care | Admitting: Acute Care

## 2016-05-07 VITALS — BP 122/78 | HR 72 | Temp 98.2°F | Ht 71.0 in | Wt 218.8 lb

## 2016-05-07 DIAGNOSIS — R509 Fever, unspecified: Secondary | ICD-10-CM | POA: Diagnosis not present

## 2016-05-07 DIAGNOSIS — J441 Chronic obstructive pulmonary disease with (acute) exacerbation: Secondary | ICD-10-CM

## 2016-05-07 DIAGNOSIS — I255 Ischemic cardiomyopathy: Secondary | ICD-10-CM | POA: Diagnosis not present

## 2016-05-07 DIAGNOSIS — R05 Cough: Secondary | ICD-10-CM | POA: Diagnosis not present

## 2016-05-07 DIAGNOSIS — J449 Chronic obstructive pulmonary disease, unspecified: Secondary | ICD-10-CM | POA: Diagnosis not present

## 2016-05-07 LAB — POCT INFLUENZA A/B
INFLUENZA A, POC: NEGATIVE
INFLUENZA B, POC: NEGATIVE

## 2016-05-07 MED ORDER — PREDNISONE 10 MG PO TABS: ORAL_TABLET | ORAL | 0 refills | Status: DC

## 2016-05-07 MED ORDER — DOXYCYCLINE HYCLATE 100 MG PO TABS: 100.0000 mg | ORAL_TABLET | Freq: Two times a day (BID) | ORAL | 0 refills | Status: DC

## 2016-05-07 NOTE — Assessment & Plan Note (Signed)
COPD exacerbation Increased mucus, tan to brown, thick Increased shortness of breath Flu negative Plan: We will check a CXR today. Flu Swab today. Prednisone taper; 10 mg tablets: 4 tabs x 2 days, 3 tabs x 2 days, 2 tabs x 2 days 1 tab x 2 days then stop. Doxycycline 100 mg twice daily.x 7 days Take with full glass of water  Activia yogurt with antibiotic  Continue your Symbicort and Spiriva daily as you have been doing. Use your Combivent as needed for shortness of breath or wheezing up to every 6 hours. Follow up with Dr. Lake Bells 05/22/2016 as is already scheduled

## 2016-05-07 NOTE — Progress Notes (Signed)
History of Present Illness Steven Guzman is a 76 y.o. male with Gold Grade C COPD. He is followed by Dr. Lake Bells.   Synopsis: GOLD Grade C COPD, still actively smoking as of November 2015 10/2013 PFT> ratio 61%, FEV1 2.53L (77% pred, 15% change), TLC 8.65 L (117% pred), RV 4.25L (161% pred), DLCO 25.39 (73% pred)  05/07/2016 Acute OV: Pt. Presents today for acute appointment. He first noted he was getting sick last Thursday. He had muscle aches and suspects that he may have had the flu. He did not get tested. Friday evening late, he was very short of breath and called EMS and they gave him a breathing treatment. He  Was offered  Transport to the ED but he chose not to go. Sunday he started coughing up thick gray mucus. He has continued fatigue, shortness of breath that is worse than baseline, and states  he had some night sweats, the  last one Tuesday night. His coughing up tan dark thick secretions in the office today.He denies chest pain, orthopnea or hemoptysis.  Tests CXR 05/07/2016: IMPRESSION: No evidence for acute  abnormality.  Hiatal hernia.  Influenza A and B per PCR: 05/07/2016>> Negative   Past medical hx Past Medical History:  Diagnosis Date  . AICD (automatic cardioverter/defibrillator) present   . Aneurysm ([place])    abdominal <4. 3.6 cm on ultrasound 09/2014.   . Anginal pain ([place])   . Anxiety   . Basal cell carcinoma of nose    S/P MOHS  . Biliary acute pancreatitis   . CAD (coronary artery disease)    a. s/p MI in 1994 with PCI to LAD at that time b. cath 10/2012 demonstrated EF 30%, inferior akinesis with mild hypokinesis of all walls, patent LAD and RCA stents; ostial PDA with 80-90% obstruction with medical therapy recommended   . Chronic bronchitis ([place])    "was having it q yr; haven't had it in a couple years" (12/21/2014)  . Chronic systolic CHF (congestive heart failure) (HCC)    EF 30 to 35 % as of 09/2014.   [name] Complication of anesthesia 10/2014   "had to have  defibrillator w/ERCP"  . COPD (chronic obstructive pulmonary disease) ([place])    a. followed by pulmonary, COPD GOLD stage II  . Depression   . Diverticulosis of colon 07/2014   noted on CT  . GERD (gastroesophageal reflux disease)   . Hiatal hernia   . Hyperglycemia 10/2012.  [name] Hyperlipidemia   . Hypertension   . Myocardial infarction 1994; 2011  . Pneumonia 1946; 2015  . Prostate enlargement 07/2014   observed on CT  . Tobacco abuse   . Ventricular tachycardia ([place])    a. 08/2009 s/p BSX E110 Teligen 100 AICD, ser#: [id number];  b. 08/2008 VT req ATP - detection reprogrammed from 160 to 150. c. EPS and VT ablation by Dr. Lovena Le 12/21/2014     Past surgical hx, Family hx, Social hx all reviewed.  Current Outpatient Prescriptions on File Prior to Visit  Medication Sig  . amiodarone (PACERONE) 200 MG tablet Take 1 tablet (200 mg total) by mouth daily. Take one tablet by mouth in the morning  . atorvastatin (LIPITOR) 80 MG tablet take 1 tablet by mouth once daily  . benazepril (LOTENSIN) 10 MG tablet take 1 tablet by mouth once daily  . budesonide-formoterol (SYMBICORT) 160-4.5 MCG/ACT inhaler inhale 2 puffs by mouth every morning and 2 puffs every evening ; SEPARATE DOSES BY 12 HOURS  .  buPROPion (WELLBUTRIN) 75 MG tablet Take 1 tablet (75 mg total) by mouth daily.  . busPIRone (BUSPAR) 15 MG tablet Take 1 tablet (15 mg total) by mouth 3 (three) times daily.  . carvedilol (COREG) 12.5 MG tablet take 1 tablet by mouth twice a day with meals  . cetirizine (ZYRTEC) 10 MG tablet Take 10 mg by mouth daily as needed for allergies.  . Choline Fenofibrate (FENOFIBRIC ACID) 135 MG CPDR take 1 capsule by mouth once daily  . escitalopram (LEXAPRO) 20 MG tablet take 1 tablet by mouth once daily  . fluticasone (FLONASE) 50 MCG/ACT nasal spray Place 2 sprays into both nostrils daily.  . furosemide (LASIX) 40 MG tablet Take 0.5 tablets (20 mg total) by mouth daily.  [name] gabapentin (NEURONTIN) 100 MG capsule  take 3 capsules by mouth twice a day  . Ipratropium-Albuterol (COMBIVENT RESPIMAT) 20-100 MCG/ACT AERS respimat Inhale 1 puff into the lungs every 6 (six) hours. Shortness of breath or wheezing  . nitroGLYCERIN (NITROSTAT) 0.4 MG SL tablet Place 1 tablet (0.4 mg total) under the tongue every 5 (five) minutes x 3 doses as needed for chest pain.  [name] omeprazole (PRILOSEC) 20 MG capsule Take 20 mg by mouth 2 (two) times daily.  . Pancrelipase, Lip-Prot-Amyl, (CREON) 24000 units CPEP 1 po tid w/meals (Patient taking differently: 1 po tid w/meals as needed)  . promethazine-codeine (PHENERGAN WITH CODEINE) 6.25-10 MG/5ML syrup Take 5 mLs by mouth every 4 (four) hours as needed.  [name] SPIRIVA HANDIHALER 18 MCG inhalation capsule inhale the contents of one capsule in the handihaler once daily  . tamsulosin (FLOMAX) 0.4 MG CAPS capsule take 1 capsule by mouth once daily AFTER SUPPER  . XARELTO 20 MG TABS tablet take 1 tablet by mouth once daily with SUPPER   No current facility-administered medications on file prior to visit.      Allergies  Allergen Reactions  . Sulfa Antibiotics Hives    Review Of Systems:  Constitutional:   No  weight loss, +night sweats,  No Fevers, chills, + fatigue, or  lassitude.  HEENT:   No headaches,  Difficulty swallowing,  Tooth/dental problems, or  Sore throat,                No sneezing, itching, ear ache, nasal congestion, post nasal drip, + body aches  CV:  No chest pain,  Orthopnea, PND, swelling in lower extremities, anasarca, dizziness, palpitations, syncope.   GI  No heartburn, indigestion, abdominal pain, nausea, vomiting, diarrhea, change in bowel habits, loss of appetite, bloody stools.   Resp: + shortness of breath with exertion less at rest.  + excess mucus, + productive cough,  No non-productive cough,  No coughing up of blood.  + change in color of mucus.  + wheezing.  No chest wall deformity  Skin: no rash or lesions.  GU: no dysuria, change in color of  urine, no urgency or frequency.  No flank pain, no hematuria   MS:  No joint pain or swelling.  No decreased range of motion.  No back pain.  Psych:  No change in mood or affect. No depression or anxiety.  No memory loss.   Vital Signs BP 122/78 (BP Location: Left Arm, Cuff Size: Normal)   Pulse 72   Temp 98.2 F (36.8 C) (Oral)   Ht 5\' 11"  (1.803 m)   Wt 218 lb 12.8 oz (99.2 kg)   SpO2 93%   BMI 30.52 kg/m    Physical Exam:  General- No distress,  A&Ox3, pleasant ENT: No sinus tenderness, TM clear, pale nasal mucosa, no oral exudate,+ post nasal drip, no LAN Cardiac: S1, S2, regular rate and rhythm, no murmur Chest: + wheeze/ no rales/ dullness; no accessory muscle use, no nasal flaring, no sternal retractions, deep cough Abd.: Soft Non-tender, obese Ext: No clubbing cyanosis, edema Neuro:  Fatigued x 3 days Skin: No rashes, warm and dry Psych: normal mood and behavior   Assessment/Plan  COPD GOLD GRADE C COPD exacerbation Increased mucus, tan to brown, thick Increased shortness of breath Flu negative Plan: We will check a CXR today. Flu Swab today. Prednisone taper; 10 mg tablets: 4 tabs x 2 days, 3 tabs x 2 days, 2 tabs x 2 days 1 tab x 2 days then stop. Doxycycline 100 mg twice daily.x 7 days Take with full glass of water  Activia yogurt with antibiotic  Continue your Symbicort and Spiriva daily as you have been doing. Use your Combivent as needed for shortness of breath or wheezing up to every 6 hours. Follow up with Dr. Lake Bells 05/22/2016 as is already scheduled       Magdalen Spatz, NP 05/07/2016  2:19 PM

## 2016-05-07 NOTE — Patient Instructions (Addendum)
It is nice to see you today. Congratulations on quitting smoking in January. Keep it up!! We will check a CXR today. Flu Swab today. Prednisone taper; 10 mg tablets: 4 tabs x 2 days, 3 tabs x 2 days, 2 tabs x 2 days 1 tab x 2 days then stop. Doxycycline 100 mg twice daily.x 7 days Take with full glass of water  Activia yogurt with antibiotic  Continue your Symbicort and Spiriva daily as you have been doing. Use your Combivent as needed for shortness of breath or wheezing up to every 6 hours. Follow up with Dr. Lake Bells 05/22/2016 as is already scheduled

## 2016-05-08 NOTE — Progress Notes (Signed)
Reviewed, agree 

## 2016-05-10 ENCOUNTER — Other Ambulatory Visit: Payer: Self-pay | Admitting: Internal Medicine

## 2016-05-10 DIAGNOSIS — I5022 Chronic systolic (congestive) heart failure: Secondary | ICD-10-CM

## 2016-05-10 DIAGNOSIS — I472 Ventricular tachycardia, unspecified: Secondary | ICD-10-CM

## 2016-05-10 DIAGNOSIS — Z95810 Presence of automatic (implantable) cardiac defibrillator: Secondary | ICD-10-CM

## 2016-05-14 ENCOUNTER — Other Ambulatory Visit: Payer: Self-pay | Admitting: Internal Medicine

## 2016-05-21 ENCOUNTER — Other Ambulatory Visit: Payer: Self-pay | Admitting: Internal Medicine

## 2016-05-22 ENCOUNTER — Encounter: Payer: Self-pay | Admitting: Pulmonary Disease

## 2016-05-22 ENCOUNTER — Other Ambulatory Visit (INDEPENDENT_AMBULATORY_CARE_PROVIDER_SITE_OTHER): Payer: Medicare Other

## 2016-05-22 ENCOUNTER — Ambulatory Visit (INDEPENDENT_AMBULATORY_CARE_PROVIDER_SITE_OTHER): Payer: Medicare Other | Admitting: Pulmonary Disease

## 2016-05-22 VITALS — BP 122/66 | HR 59 | Ht 71.0 in | Wt 222.0 lb

## 2016-05-22 DIAGNOSIS — F17210 Nicotine dependence, cigarettes, uncomplicated: Secondary | ICD-10-CM | POA: Diagnosis not present

## 2016-05-22 DIAGNOSIS — I255 Ischemic cardiomyopathy: Secondary | ICD-10-CM | POA: Diagnosis not present

## 2016-05-22 DIAGNOSIS — J449 Chronic obstructive pulmonary disease, unspecified: Secondary | ICD-10-CM | POA: Diagnosis not present

## 2016-05-22 DIAGNOSIS — R5383 Other fatigue: Secondary | ICD-10-CM

## 2016-05-22 DIAGNOSIS — R042 Hemoptysis: Secondary | ICD-10-CM | POA: Diagnosis not present

## 2016-05-22 DIAGNOSIS — R42 Dizziness and giddiness: Secondary | ICD-10-CM

## 2016-05-22 LAB — CBC WITH DIFFERENTIAL/PLATELET
BASOS ABS: 0.1 10*3/uL (ref 0.0–0.1)
Basophils Relative: 0.7 % (ref 0.0–3.0)
EOS PCT: 0.7 % (ref 0.0–5.0)
Eosinophils Absolute: 0.1 10*3/uL (ref 0.0–0.7)
HCT: 39.2 % (ref 39.0–52.0)
Hemoglobin: 12.9 g/dL — ABNORMAL LOW (ref 13.0–17.0)
LYMPHS ABS: 1.4 10*3/uL (ref 0.7–4.0)
Lymphocytes Relative: 14.3 % (ref 12.0–46.0)
MCHC: 32.8 g/dL (ref 30.0–36.0)
MCV: 92.5 fl (ref 78.0–100.0)
MONO ABS: 0.9 10*3/uL (ref 0.1–1.0)
Monocytes Relative: 9.3 % (ref 3.0–12.0)
NEUTROS PCT: 75 % (ref 43.0–77.0)
Neutro Abs: 7.2 10*3/uL (ref 1.4–7.7)
Platelets: 190 10*3/uL (ref 150.0–400.0)
RBC: 4.24 Mil/uL (ref 4.22–5.81)
RDW: 16.1 % — ABNORMAL HIGH (ref 11.5–15.5)
WBC: 9.6 10*3/uL (ref 4.0–10.5)

## 2016-05-22 MED ORDER — ALBUTEROL SULFATE (2.5 MG/3ML) 0.083% IN NEBU: 2.5000 mg | INHALATION_SOLUTION | Freq: Four times a day (QID) | RESPIRATORY_TRACT | 12 refills | Status: DC | PRN

## 2016-05-22 NOTE — Progress Notes (Signed)
Subjective:    Patient ID: Steven Guzman, male    DOB: 06-16-1940, 76 y.o.   MRN: [id number]  Synopsis: GOLD Grade C COPD, still actively smoking as of November 2015   HPI Chief Complaint  Patient presents with  . Follow-up    pt feeling some better since seeing SG on 05/07/16.  Pt does note fatigue, sob with exertion.       [place] had another flare up earlier this month and he needed prednisone and doxycycline.  He says this really helped a lot but he hasn't been back to baseline since he had the flu.  He notes that he quit smoking on January 1 and he stayed away for 6 weeks and he could really tell a big difference in his breathing.  He says he could climb hills.   He hasn't had hemoptysis again.    He says taht he feels dizzy when he gets up from a seated position.  Sometimes this is associated with poor breathing.   Past Medical History:  Diagnosis Date  . AICD (automatic cardioverter/defibrillator) present   . Aneurysm ([place])    abdominal <4. 3.6 cm on ultrasound 09/2014.   . Anginal pain ([place])   . Anxiety   . Basal cell carcinoma of nose    S/P MOHS  . Biliary acute pancreatitis   . CAD (coronary artery disease)    a. s/p MI in 1994 with PCI to LAD at that time b. cath 10/2012 demonstrated EF 30%, inferior akinesis with mild hypokinesis of all walls, patent LAD and RCA stents; ostial PDA with 80-90% obstruction with medical therapy recommended   . Chronic bronchitis ([place])    "was having it q yr; haven't had it in a couple years" (12/21/2014)  . Chronic systolic CHF (congestive heart failure) ([facility])    EF 30 to 35 % as of 09/2014.   [name] Complication of anesthesia 10/2014   "had to have defibrillator w/ERCP"  . COPD (chronic obstructive pulmonary disease) ([place])    a. followed by pulmonary, COPD GOLD stage II  . Depression   . Diverticulosis of colon 07/2014   noted on CT  . GERD (gastroesophageal reflux disease)   . Hiatal hernia   . Hyperglycemia 10/2012.  [name]  Hyperlipidemia   . Hypertension   . Myocardial infarction 1994; 2011  . Pneumonia 1946; 2015  . Prostate enlargement 07/2014   observed on CT  . Tobacco abuse   . Ventricular tachycardia ([place])    a. 08/2009 s/p BSX E110 Teligen 100 AICD, ser#: [id number];  b. 08/2008 VT req ATP - detection reprogrammed from 160 to 150. c. EPS and VT ablation by Dr. Lovena Le 12/21/2014     Review of Systems  Constitutional: Negative for chills, fatigue and fever.  HENT: Negative for postnasal drip, rhinorrhea and sinus pressure.   Respiratory: Negative for cough, shortness of breath and wheezing.   Cardiovascular: Negative for chest pain, palpitations and leg swelling.       Objective:   Physical Exam Vitals:   05/22/16 1550  BP: 122/66  BP Location: Left Arm  Cuff Size: Normal  Pulse: (!) 59  SpO2: 95%  Weight: 222 lb (100.7 kg)  Height: 5\' 11"  (1.803 m)   RA  Gen: chronically ill  appearing HENT: OP clear, TM's clear, neck supple PULM: CTA B, normal percussion CV: RRR, no mgr, trace edema GI: BS+, soft, nontender Derm: no cyanosis or rash Psyche: normal mood and affect  Records reviewed from his visit on 05/07/2016 with the pulmonary nurse practitioner who treated him for a COPD exacerbation. She checked a chest x-ray.   PFT: 10/2013 PFT> ratio 61%, FEV1 2.53L (77% pred, 15% change), TLC 8.65 L (117% pred), RV 4.25L (161% pred), DLCO 25.39 (73% pred)  Procedure: Bronchoscopy 01/09/2016 normal airways  Imaging: July 2017 CT chest images personally reviewed showing mild to moderate centrilobular emphysema, no obvious pulmonary parenchymal mass. February 2018 chest x-ray images independently reviewed showing emphysema, pacemaker in place, mild cardiomegaly, no other pulmonary parenchymal abnormalities.    Assessment & Plan:   Dizzinesses New problem, sounds orthostatic in nature.  Because he is on a blood thinner we will check a hemoglobin value to make sure he's not having any  anemia. I've asked him to measure his blood pressure more frequently and to drink and neck are class of water a day for a week to see if this helps. If that does not help he may need to go back in to see cardiology again.  Hemoptysis No further recurrence. Her recent chest x-ray normal.  COPD GOLD GRADE C He has moderate airflow obstruction but severe COPD and that he has multiple exacerbations throughout the course of the year which are primarily due to ongoing tobacco use.  Today once again we counseled him on the importance of quitting smoking.  Plan: Continue Symbicort and Spiriva At albuterol nebulizer to use on an as-needed basis at home  Cigarette smoker Counseled at length to quit smoking again. We spent 4 minutes talking about this today.   Updated Medication List Outpatient Encounter Prescriptions as of 05/22/2016  Medication Sig  . amiodarone (PACERONE) 200 MG tablet take 1 tablet by mouth every morning  . atorvastatin (LIPITOR) 80 MG tablet take 1 tablet by mouth once daily  . benazepril (LOTENSIN) 10 MG tablet take 1 tablet by mouth once daily  . budesonide-formoterol (SYMBICORT) 160-4.5 MCG/ACT inhaler inhale 2 puffs by mouth every morning and 2 puffs every evening ; SEPARATE DOSES BY 12 HOURS  . buPROPion (WELLBUTRIN) 75 MG tablet Take 1 tablet (75 mg total) by mouth daily.  . busPIRone (BUSPAR) 15 MG tablet Take 1 tablet (15 mg total) by mouth 3 (three) times daily.  . carvedilol (COREG) 12.5 MG tablet take 1 tablet by mouth twice a day with meals  . cetirizine (ZYRTEC) 10 MG tablet Take 10 mg by mouth daily as needed for allergies.  . Choline Fenofibrate (FENOFIBRIC ACID) 135 MG CPDR take 1 capsule by mouth once daily  . escitalopram (LEXAPRO) 20 MG tablet take 1 tablet by mouth once daily  . fluticasone (FLONASE) 50 MCG/ACT nasal spray Place 2 sprays into both nostrils daily.  . furosemide (LASIX) 40 MG tablet Take 0.5 tablets (20 mg total) by mouth daily.  [name]  gabapentin (NEURONTIN) 100 MG capsule take 3 capsules by mouth twice a day  . Ipratropium-Albuterol (COMBIVENT RESPIMAT) 20-100 MCG/ACT AERS respimat Inhale 1 puff into the lungs every 6 (six) hours. Shortness of breath or wheezing  . nitroGLYCERIN (NITROSTAT) 0.4 MG SL tablet Place 1 tablet (0.4 mg total) under the tongue every 5 (five) minutes x 3 doses as needed for chest pain.  [name] omeprazole (PRILOSEC) 20 MG capsule Take 20 mg by mouth 2 (two) times daily.  . Pancrelipase, Lip-Prot-Amyl, (CREON) 24000 units CPEP 1 po tid w/meals (Patient taking differently: 1 po tid w/meals as needed)  . promethazine-codeine (PHENERGAN WITH CODEINE) 6.25-10 MG/5ML syrup Take 5 mLs by mouth  every 4 (four) hours as needed.  [name] SPIRIVA HANDIHALER 18 MCG inhalation capsule inhale the contents of one capsule in the handihaler once daily  . tamsulosin (FLOMAX) 0.4 MG CAPS capsule take 1 capsule by mouth once daily AFTER SUPPER  . XARELTO 20 MG TABS tablet take 1 tablet by mouth once daily with SUPPER  . albuterol (PROVENTIL) (2.5 MG/3ML) 0.083% nebulizer solution Take 3 mLs (2.5 mg total) by nebulization every 6 (six) hours as needed for wheezing or shortness of breath.  . [DISCONTINUED] doxycycline (VIBRA-TABS) 100 MG tablet Take 1 tablet (100 mg total) by mouth 2 (two) times daily. (Patient not taking: Reported on 05/22/2016)  . [DISCONTINUED] predniSONE (DELTASONE) 10 MG tablet Take 4 tabs x 2 days, 3 tabs x 2 days, 2 tabs x 2 days, 1 tab x 2 days and stop. (Patient not taking: Reported on 05/22/2016)   No facility-administered encounter medications on file as of 05/22/2016.

## 2016-05-22 NOTE — Assessment & Plan Note (Signed)
He has moderate airflow obstruction but severe COPD and that he has multiple exacerbations throughout the course of the year which are primarily due to ongoing tobacco use.  Today once again we counseled him on the importance of quitting smoking.  Plan: Continue Symbicort and Spiriva At albuterol nebulizer to use on an as-needed basis at home

## 2016-05-22 NOTE — Assessment & Plan Note (Signed)
Counseled at length to quit smoking again. We spent 4 minutes talking about this today.

## 2016-05-22 NOTE — Assessment & Plan Note (Signed)
No further recurrence. Her recent chest x-ray normal.

## 2016-05-22 NOTE — Assessment & Plan Note (Signed)
New problem, sounds orthostatic in nature.  Because he is on a blood thinner we will check a hemoglobin value to make sure he's not having any anemia. I've asked him to measure his blood pressure more frequently and to drink and neck are class of water a day for a week to see if this helps. If that does not help he may need to go back in to see cardiology again.

## 2016-05-22 NOTE — Patient Instructions (Signed)
We will call you with the results of your bloodwork Drink an extra glass of water every day Measure your blood pressure every day Keep taking your inhaled medicines as you are doing Quit smoking We will see you back in 4 months or sooner if needed

## 2016-06-02 DIAGNOSIS — F17210 Nicotine dependence, cigarettes, uncomplicated: Secondary | ICD-10-CM | POA: Diagnosis not present

## 2016-06-02 DIAGNOSIS — K219 Gastro-esophageal reflux disease without esophagitis: Secondary | ICD-10-CM | POA: Diagnosis not present

## 2016-06-02 DIAGNOSIS — R490 Dysphonia: Secondary | ICD-10-CM | POA: Diagnosis not present

## 2016-06-30 ENCOUNTER — Ambulatory Visit (INDEPENDENT_AMBULATORY_CARE_PROVIDER_SITE_OTHER): Payer: Medicare Other | Admitting: Internal Medicine

## 2016-06-30 ENCOUNTER — Encounter: Payer: Self-pay | Admitting: Internal Medicine

## 2016-06-30 VITALS — BP 90/64 | HR 60 | Ht 71.0 in | Wt 228.6 lb

## 2016-06-30 DIAGNOSIS — I5022 Chronic systolic (congestive) heart failure: Secondary | ICD-10-CM | POA: Diagnosis not present

## 2016-06-30 DIAGNOSIS — I472 Ventricular tachycardia, unspecified: Secondary | ICD-10-CM

## 2016-06-30 DIAGNOSIS — I255 Ischemic cardiomyopathy: Secondary | ICD-10-CM

## 2016-06-30 DIAGNOSIS — I25810 Atherosclerosis of coronary artery bypass graft(s) without angina pectoris: Secondary | ICD-10-CM | POA: Diagnosis not present

## 2016-06-30 LAB — CUP PACEART INCLINIC DEVICE CHECK
HighPow Impedance: 52 Ohm
Implantable Lead Implant Date: 20110610
Implantable Lead Location: 753860
Implantable Lead Serial Number: 28681386
Implantable Pulse Generator Implant Date: 20110610
Lead Channel Pacing Threshold Amplitude: 0.9 V
Lead Channel Pacing Threshold Amplitude: 0.9 V
Lead Channel Pacing Threshold Pulse Width: 0.4 ms
Lead Channel Sensing Intrinsic Amplitude: 14.8 mV
Lead Channel Sensing Intrinsic Amplitude: 3 mV
Lead Channel Setting Pacing Pulse Width: 0.4 ms
Lead Channel Setting Sensing Sensitivity: 0.6 mV
MDC IDC LEAD IMPLANT DT: 20110610
MDC IDC LEAD LOCATION: 753859
MDC IDC LEAD SERIAL: 339643
MDC IDC MSMT LEADCHNL RA IMPEDANCE VALUE: 628 Ohm
MDC IDC MSMT LEADCHNL RV IMPEDANCE VALUE: 479 Ohm
MDC IDC MSMT LEADCHNL RV PACING THRESHOLD PULSEWIDTH: 0.4 ms
MDC IDC SESS DTM: 20180403170156
MDC IDC SET LEADCHNL RA PACING AMPLITUDE: 2 V
MDC IDC SET LEADCHNL RV PACING AMPLITUDE: 2 V
MDC IDC STAT BRADY RA PERCENT PACED: 84 %
MDC IDC STAT BRADY RV PERCENT PACED: 1 % — AB
Pulse Gen Serial Number: 164892

## 2016-06-30 MED ORDER — AMIODARONE HCL 200 MG PO TABS: ORAL_TABLET | ORAL | 3 refills | Status: DC

## 2016-06-30 MED ORDER — CARVEDILOL 6.25 MG PO TABS: 6.2500 mg | ORAL_TABLET | Freq: Two times a day (BID) | ORAL | 3 refills | Status: DC

## 2016-06-30 NOTE — Progress Notes (Signed)
HPI Steven Guzman returns today for ongoing evaluation and management of his ICD and ventricular tachycardia. He is a very pleasant 76 year old man with known coronary disease, status post MI, who developed ventricular tachycardia and underwent ICD implantation several years ago. Because of recurrent ventricular tachycardia, he was placed on amiodarone therapy. He has had recurrent VT despite fairly high dose amiodarone. He underwent successful VT ablation about a year ago. He has had no more VT and has been stable from a cardiac perspective. His main problem is that he has fatigue and lightheadedness. His blood pressure has been low. Allergies  Allergen Reactions  . Sulfa Antibiotics Hives     Current Outpatient Prescriptions  Medication Sig Dispense Refill  . albuterol (PROVENTIL) (2.5 MG/3ML) 0.083% nebulizer solution Take 3 mLs (2.5 mg total) by nebulization every 6 (six) hours as needed for wheezing or shortness of breath. 360 mL 12  . amiodarone (PACERONE) 200 MG tablet take 1 tablet by mouth every morning 30 tablet 10  . atorvastatin (LIPITOR) 80 MG tablet take 1 tablet by mouth once daily 30 tablet 9  . benazepril (LOTENSIN) 10 MG tablet take 1 tablet by mouth once daily 90 tablet 2  . budesonide-formoterol (SYMBICORT) 160-4.5 MCG/ACT inhaler inhale 2 puffs by mouth every morning and 2 puffs every evening ; SEPARATE DOSES BY 12 HOURS 10.2 Inhaler 5  . buPROPion (WELLBUTRIN) 75 MG tablet Take 1 tablet (75 mg total) by mouth daily. 30 tablet 6  . busPIRone (BUSPAR) 15 MG tablet Take 1 tablet (15 mg total) by mouth 3 (three) times daily. 90 tablet 5  . carvedilol (COREG) 12.5 MG tablet take 1 tablet by mouth twice a day with meals 180 tablet 2  . cetirizine (ZYRTEC) 10 MG tablet Take 10 mg by mouth daily as needed for allergies.    . Choline Fenofibrate (FENOFIBRIC ACID) 135 MG CPDR take 1 capsule by mouth once daily 30 capsule 9  . escitalopram (LEXAPRO) 20 MG tablet take 1 tablet by  mouth once daily 30 tablet 6  . fluticasone (FLONASE) 50 MCG/ACT nasal spray Place 2 sprays into both nostrils daily.    . furosemide (LASIX) 40 MG tablet Take 0.5 tablets (20 mg total) by mouth daily. 15 tablet 9  . gabapentin (NEURONTIN) 100 MG capsule take 3 capsules by mouth twice a day 540 capsule 2  . Ipratropium-Albuterol (COMBIVENT RESPIMAT) 20-100 MCG/ACT AERS respimat Inhale 1 puff into the lungs every 6 (six) hours. Shortness of breath or wheezing    . nitroGLYCERIN (NITROSTAT) 0.4 MG SL tablet Place 1 tablet (0.4 mg total) under the tongue every 5 (five) minutes x 3 doses as needed for chest pain. 25 tablet 11  . omeprazole (PRILOSEC) 20 MG capsule Take 20 mg by mouth 2 (two) times daily.    . Pancrelipase, Lip-Prot-Amyl, (CREON) 24000 units CPEP 1 po tid w/meals (Patient taking differently: 1 po tid w/meals as needed) 90 capsule 3  . promethazine-codeine (PHENERGAN WITH CODEINE) 6.25-10 MG/5ML syrup Take 5 mLs by mouth every 4 (four) hours as needed. 300 mL 0  . SPIRIVA HANDIHALER 18 MCG inhalation capsule inhale the contents of one capsule in the handihaler once daily 30 capsule 5  . tamsulosin (FLOMAX) 0.4 MG CAPS capsule take 1 capsule by mouth once daily AFTER SUPPER 30 capsule 1  . XARELTO 20 MG TABS tablet take 1 tablet by mouth once daily with SUPPER 30 tablet 6   No current facility-administered medications for this  visit.      Past Medical History:  Diagnosis Date  . AICD (automatic cardioverter/defibrillator) present   . Aneurysm ([place])    abdominal <4. 3.6 cm on ultrasound 09/2014.   . Anginal pain ([place])   . Anxiety   . Basal cell carcinoma of nose    S/P MOHS  . Biliary acute pancreatitis   . CAD (coronary artery disease)    a. s/p MI in 1994 with PCI to LAD at that time b. cath 10/2012 demonstrated EF 30%, inferior akinesis with mild hypokinesis of all walls, patent LAD and RCA stents; ostial PDA with 80-90% obstruction with medical therapy recommended   . Chronic  bronchitis ([place])    "was having it q yr; haven't had it in a couple years" (12/21/2014)  . Chronic systolic CHF (congestive heart failure) (HCC)    EF 30 to 35 % as of 09/2014.   [name] Complication of anesthesia 10/2014   "had to have defibrillator w/ERCP"  . COPD (chronic obstructive pulmonary disease) ([place])    a. followed by pulmonary, COPD GOLD stage II  . Depression   . Diverticulosis of colon 07/2014   noted on CT  . GERD (gastroesophageal reflux disease)   . Hiatal hernia   . Hyperglycemia 10/2012.  [name] Hyperlipidemia   . Hypertension   . Myocardial infarction 1994; 2011  . Pneumonia 1946; 2015  . Prostate enlargement 07/2014   observed on CT  . Tobacco abuse   . Ventricular tachycardia ([place])    a. 08/2009 s/p BSX E110 Teligen 100 AICD, ser#: [id number];  b. 08/2008 VT req ATP - detection reprogrammed from 160 to 150. c. EPS and VT ablation by Dr. Lovena Le 12/21/2014    ROS:   All systems reviewed and negative except as noted in the HPI.   Past Surgical History:  Procedure Laterality Date  . CATARACT EXTRACTION W/ INTRAOCULAR LENS  IMPLANT, BILATERAL Bilateral ~ 2011  . COLONOSCOPY    . ELECTROPHYSIOLOGIC STUDY N/A 12/21/2014   Procedure: V Tach Ablation;  Surgeon: [name], MD;  Location: [facility];  Service: Cardiovascular;  Laterality: N/A;  . ERCP N/A 11/16/2014   Procedure: ENDOSCOPIC RETROGRADE CHOLANGIOPANCREATOGRAPHY (ERCP);  Surgeon: [name], MD;  Location: [facility];  Service: Endoscopy;  Laterality: N/A;  . EYE SURGERY    . FOOT SURGERY Left 2005   "fixed bone that stuck out in my ankle area"  . HEMORRHOID BANDING    . IMPLANTABLE CARDIOVERTER DEFIBRILLATOR IMPLANT  09/06/09   BSX dual chamber ICD implanted in [place] for cardiac arrest 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  . INGUINAL HERNIA REPAIR Right ~ 1995  . LEFT HEART CATHETERIZATION WITH CORONARY ANGIOGRAM N/A 11/25/2012   demonstrated EF 30%, inferior akinesis with mild hypokinesis of all walls, patent LAD  and RCA stents; ostial PDA with 80-90% obstruction with medical therapy recommended  . MOHS SURGERY  2008   nose, skin graft  . RETINAL DETACHMENT SURGERY Right 2013  . TENOLYSIS Right 12/21/2013   Procedure: TENOLYSIS FLEXOR CARPI RADIALIS ,DEBRIDEMENT RIGHT JOINT WRIST,DEBRIDEMENT SCAPHOTRAPEZIAL TRAPEZOID, REPAIR OF EXTENSOR HOOD;  Surgeon: Daryll Brod, MD;  Location: [facility];  Service: Orthopedics;  Laterality: Right;  . V-TACH ABLATION  12/21/2014  . VIDEO BRONCHOSCOPY Bilateral 01/09/2016   Procedure: VIDEO BRONCHOSCOPY WITHOUT FLUORO;  Surgeon: Juanito Doom, MD;  Location: [facility];  Service: Cardiopulmonary;  Laterality: Bilateral;     Family History  Problem Relation Age of Onset  .  Heart attack Brother   . CAD Father   . Hypertension Father   . CAD Mother   . Hypertension Mother   . Hypertension Brother   . Stroke Neg Hx      Social History   Social History  . Marital status: Married    Spouse name: N/A  . Number of children: N/A  . Years of education: N/A   Occupational History  . Retired    Social History Main Topics  . Smoking status: Current Every Day Smoker    Packs/day: 1.00    Years: 55.00    Types: Cigarettes  . Smokeless tobacco: Never Used     Comment: had quit, recently bought a pack  . Alcohol use 0.0 oz/week     Comment: 12/21/2014 "might have a couple glasses of wine/year"  . Drug use: No  . Sexual activity: Not Currently   Other Topics Concern  . Not on file   Social History Narrative  . No narrative on file     BP 90/64   Pulse 60   Ht 5\' 11"  (1.803 m)   Wt 228 lb 9.6 oz (103.7 kg)   SpO2 93%   BMI 31.88 kg/m   Physical Exam:  stable appearing 76 year old man, NAD HEENT: Unremarkable Neck:  7 cm JVD, no thyromegally Back:  No CVA tenderness Lungs:  Clear with no wheezes, rales, or rhonchi. HEART:  Regular rate rhythm, no murmurs, no rubs, no clicks Abd:  soft, positive bowel sounds, no  organomegally, no rebound, no guarding Ext:  2 plus pulses, no edema, no cyanosis, no clubbing Skin:  No rashes no nodules Neuro:  CN II through XII intact, motor grossly intact   DEVICE  Normal device function.  See PaceArt for details.   Assess/Plan: 1. VT - he has had no more VT since his ablation. Will plan to reduce the dose of his amiodarone. 2. PAF - he has mostly maintained NSR. He will gradually reduce his dose of amio. 3. Dizziness - his blood pressure has been low and he is dizzy. I have asked the patient to reduce his dose of coreg to 6.25 twice daily. 4. Chronic systolic heart failure - he appears if anything a little dry. He will continue his current meds except as noted above. 5. ICD - his [employer] device is working normally. Will recheck in several months.  Steven Guzman.D.

## 2016-06-30 NOTE — Patient Instructions (Addendum)
Medication Instructions:  Your physician has recommended you make the following change in your medication:  1) Decrease Amiodarone to 1 tablet (200 mg) daily Monday through Friday and 1/2 tablet (100 mg) on Saturday and Sunday 2) Decrease Carvedilol to 6.25 mg twice daily   Labwork: None ordered   Testing/Procedures: None ordered   Follow-Up: Your physician wants you to follow-up in: 3 months in the [facility] and 12 months with Dr. Lovena Le. You will receive a reminder letter in the mail two months in advance. If you don't receive a letter, please call our office to schedule the follow-up appointment.    Any Other Special Instructions Will Be Listed Below (If Applicable).     If you need a refill on your cardiac medications before your next appointment, please call your pharmacy.

## 2016-07-12 ENCOUNTER — Other Ambulatory Visit: Payer: Self-pay | Admitting: Internal Medicine

## 2016-07-12 ENCOUNTER — Other Ambulatory Visit: Payer: Self-pay | Admitting: Interventional Cardiology

## 2016-07-19 DIAGNOSIS — R531 Weakness: Secondary | ICD-10-CM | POA: Diagnosis not present

## 2016-07-19 DIAGNOSIS — R404 Transient alteration of awareness: Secondary | ICD-10-CM | POA: Diagnosis not present

## 2016-07-21 ENCOUNTER — Ambulatory Visit (INDEPENDENT_AMBULATORY_CARE_PROVIDER_SITE_OTHER): Payer: Medicare Other | Admitting: Internal Medicine

## 2016-07-21 ENCOUNTER — Telehealth: Payer: Self-pay | Admitting: Internal Medicine

## 2016-07-21 ENCOUNTER — Encounter: Payer: Self-pay | Admitting: Internal Medicine

## 2016-07-21 DIAGNOSIS — I5022 Chronic systolic (congestive) heart failure: Secondary | ICD-10-CM | POA: Diagnosis not present

## 2016-07-21 DIAGNOSIS — I472 Ventricular tachycardia, unspecified: Secondary | ICD-10-CM

## 2016-07-21 DIAGNOSIS — I255 Ischemic cardiomyopathy: Secondary | ICD-10-CM

## 2016-07-21 MED ORDER — MEXILETINE HCL 200 MG PO CAPS: 200.0000 mg | ORAL_CAPSULE | Freq: Three times a day (TID) | ORAL | 12 refills | Status: DC

## 2016-07-21 NOTE — Patient Instructions (Signed)
Your physician has recommended you make the following change in your medication: START MEXILETINE 200 MG THREE TIMES DAILY  Your physician recommends that you schedule a follow-up appointment in: 07/30/2016 @ 11:15.  VENTRICULAR TACHYCARDIA

## 2016-07-21 NOTE — Progress Notes (Signed)
HPI [name] presents today because he thinks he has developed atrial fib and feels his heart racing. He is a pleasant 76 yo man with chronic systolic heart failure, VT, s/p ICD implant, s/p VT ablation over 2 years ago. The patient has developed palpitations and thinks he has reverted back to atrial fib. He had his dose of amiodarone reduce when I saw him about 3 weeks ago. He denies chest pain and has not had syncope or an ICD shock. Allergies  Allergen Reactions  . Sulfa Antibiotics Hives     Current Outpatient Prescriptions  Medication Sig Dispense Refill  . albuterol (PROVENTIL) (2.5 MG/3ML) 0.083% nebulizer solution Take 3 mLs (2.5 mg total) by nebulization every 6 (six) hours as needed for wheezing or shortness of breath. 360 mL 12  . amiodarone (PACERONE) 200 MG tablet Take 1 tablet (200 mg) by mouth daily Monday through Friday and 1/2 tablet (100 mg) on Saturday and Sunday 90 tablet 3  . atorvastatin (LIPITOR) 80 MG tablet take 1 tablet by mouth once daily 30 tablet 9  . benazepril (LOTENSIN) 10 MG tablet take 1 tablet by mouth once daily 90 tablet 2  . budesonide-formoterol (SYMBICORT) 160-4.5 MCG/ACT inhaler inhale 2 puffs by mouth every morning and 2 puffs every evening ; SEPARATE DOSES BY 12 HOURS 10.2 Inhaler 5  . buPROPion (WELLBUTRIN) 75 MG tablet Take 1 tablet (75 mg total) by mouth daily. 30 tablet 6  . busPIRone (BUSPAR) 15 MG tablet Take 1 tablet (15 mg total) by mouth 3 (three) times daily. 90 tablet 5  . carvedilol (COREG) 6.25 MG tablet Take 1 tablet (6.25 mg total) by mouth 2 (two) times daily. 180 tablet 3  . cetirizine (ZYRTEC) 10 MG tablet Take 10 mg by mouth daily as needed for allergies.    . Choline Fenofibrate (FENOFIBRIC ACID) 135 MG CPDR take 1 capsule by mouth once daily 30 capsule 9  . escitalopram (LEXAPRO) 20 MG tablet take 1 tablet by mouth once daily 30 tablet 6  . fluticasone (FLONASE) 50 MCG/ACT nasal spray Place 2 sprays into both nostrils  daily.    . furosemide (LASIX) 40 MG tablet take 1/2 tablet by mouth once daily 45 tablet 3  . gabapentin (NEURONTIN) 100 MG capsule take 3 capsules by mouth twice a day 540 capsule 2  . Ipratropium-Albuterol (COMBIVENT RESPIMAT) 20-100 MCG/ACT AERS respimat Inhale 1 puff into the lungs every 6 (six) hours. Shortness of breath or wheezing    . nitroGLYCERIN (NITROSTAT) 0.4 MG SL tablet Place 1 tablet (0.4 mg total) under the tongue every 5 (five) minutes x 3 doses as needed for chest pain. 25 tablet 11  . omeprazole (PRILOSEC) 20 MG capsule Take 20 mg by mouth 2 (two) times daily.    . Pancrelipase, Lip-Prot-Amyl, (CREON) 24000 units CPEP 1 po tid w/meals (Patient taking differently: 1 po tid w/meals as needed) 90 capsule 3  . promethazine-codeine (PHENERGAN WITH CODEINE) 6.25-10 MG/5ML syrup Take 5 mLs by mouth every 4 (four) hours as needed. 300 mL 0  . SPIRIVA HANDIHALER 18 MCG inhalation capsule inhale the contents of one capsule in the handihaler once daily 30 capsule 5  . tamsulosin (FLOMAX) 0.4 MG CAPS capsule take 1 capsule by mouth once daily after SUPPER 30 capsule 1  . XARELTO 20 MG TABS tablet take 1 tablet by mouth once daily with SUPPER 30 tablet 6  . mexiletine (MEXITIL) 200 MG capsule Take 1 capsule (200 mg total)  by mouth 3 (three) times daily. 90 capsule 12   No current facility-administered medications for this visit.      Past Medical History:  Diagnosis Date  . AICD (automatic cardioverter/defibrillator) present   . Aneurysm ([place])    abdominal <4. 3.6 cm on ultrasound 09/2014.   . Anginal pain ([place])   . Anxiety   . Basal cell carcinoma of nose    S/P MOHS  . Biliary acute pancreatitis   . CAD (coronary artery disease)    a. s/p MI in 1994 with PCI to LAD at that time b. cath 10/2012 demonstrated EF 30%, inferior akinesis with mild hypokinesis of all walls, patent LAD and RCA stents; ostial PDA with 80-90% obstruction with medical therapy recommended   . Chronic  bronchitis ([place])    "was having it q yr; haven't had it in a couple years" (12/21/2014)  . Chronic systolic CHF (congestive heart failure) ([facility])    EF 30 to 35 % as of 09/2014.   [name] Complication of anesthesia 10/2014   "had to have defibrillator w/ERCP"  . COPD (chronic obstructive pulmonary disease) ([place])    a. followed by pulmonary, COPD GOLD stage II  . Depression   . Diverticulosis of colon 07/2014   noted on CT  . GERD (gastroesophageal reflux disease)   . Hiatal hernia   . Hyperglycemia 10/2012.  [name] Hyperlipidemia   . Hypertension   . Myocardial infarction [facility]) 1994; 2011  . Pneumonia 1946; 2015  . Prostate enlargement 07/2014   observed on CT  . Tobacco abuse   . Ventricular tachycardia ([place])    a. 08/2009 s/p BSX E110 Teligen 100 AICD, ser#: [id number];  b. 08/2008 VT req ATP - detection reprogrammed from 160 to 150. c. EPS and VT ablation by Dr. Lovena Le 12/21/2014    ROS:   All systems reviewed and negative except as noted in the HPI.   Past Surgical History:  Procedure Laterality Date  . CATARACT EXTRACTION W/ INTRAOCULAR LENS  IMPLANT, BILATERAL Bilateral ~ 2011  . COLONOSCOPY    . ELECTROPHYSIOLOGIC STUDY N/A 12/21/2014   Procedure: V Tach Ablation;  Surgeon: [name], MD;  Location: [facility];  Service: Cardiovascular;  Laterality: N/A;  . ERCP N/A 11/16/2014   Procedure: ENDOSCOPIC RETROGRADE CHOLANGIOPANCREATOGRAPHY (ERCP);  Surgeon: [name], MD;  Location: [facility];  Service: Endoscopy;  Laterality: N/A;  . EYE SURGERY    . FOOT SURGERY Left 2005   "fixed bone that stuck out in my ankle area"  . HEMORRHOID BANDING    . IMPLANTABLE CARDIOVERTER DEFIBRILLATOR IMPLANT  09/06/09   BSX dual chamber ICD implanted in [place] for cardiac arrest 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  . INGUINAL HERNIA REPAIR Right ~ 1995  . LEFT HEART CATHETERIZATION WITH CORONARY ANGIOGRAM N/A 11/25/2012   demonstrated EF 30%, inferior akinesis with mild hypokinesis of all walls,  patent LAD and RCA stents; ostial PDA with 80-90% obstruction with medical therapy recommended  . MOHS SURGERY  2008   nose, skin graft  . RETINAL DETACHMENT SURGERY Right 2013  . TENOLYSIS Right 12/21/2013   Procedure: TENOLYSIS FLEXOR CARPI RADIALIS ,DEBRIDEMENT RIGHT JOINT WRIST,DEBRIDEMENT SCAPHOTRAPEZIAL TRAPEZOID, REPAIR OF EXTENSOR HOOD;  Surgeon: Daryll Brod, MD;  Location: [facility];  Service: Orthopedics;  Laterality: Right;  . V-TACH ABLATION  12/21/2014  . VIDEO BRONCHOSCOPY Bilateral 01/09/2016   Procedure: VIDEO BRONCHOSCOPY WITHOUT FLUORO;  Surgeon: Juanito Doom, MD;  [facility];  Service:  Cardiopulmonary;  Laterality: Bilateral;     Family History  Problem Relation Age of Onset  . Heart attack Brother   . CAD Father   . Hypertension Father   . CAD Mother   . Hypertension Mother   . Hypertension Brother   . Stroke Neg Hx      Social History   Social History  . Marital status: Married    Spouse name: N/A  . Number of children: N/A  . Years of education: N/A   Occupational History  . Retired    Social History Main Topics  . Smoking status: Current Every Day Smoker    Packs/day: 1.00    Years: 55.00    Types: Cigarettes  . Smokeless tobacco: Never Used     Comment: had quit, recently bought a pack  . Alcohol use 0.0 oz/week     Comment: 12/21/2014 "might have a couple glasses of wine/year"  . Drug use: No  . Sexual activity: Not Currently   Other Topics Concern  . Not on file   Social History Narrative  . No narrative on file     BP 91/62 (Patient Position: Sitting, Cuff Size: Normal)   Pulse 100   Resp 16   Ht 5\' 11"  (1.803 m)   Wt 222 lb (100.7 kg)   BMI 30.96 kg/m   Physical Exam:  Well appearing 76 yo man, NAD HEENT: Unremarkable Neck:  6 cm JVD, no thyromegally Lymphatics:  No adenopathy Back:  No CVA tenderness Lungs:  Clear with no wheezes HEART:  Regular tachy rhythm, no murmurs, no rubs, no  clicks Abd:  soft, positive bowel sounds, no organomegally, no rebound, no guarding Ext:  2 plus pulses, no edema, no cyanosis, no clubbing Skin:  No rashes no nodules Neuro:  CN II through XII intact, motor grossly intact  EKG - VT at 100/min DEVICE  Normal device function.  See PaceArt for details.   Assess/Plan: 1. VT - the patient has 1:1 VA conduction. However, I paced the ventricle faster than the tachycardia and got VA dissociation without breaking the tachycardia. We were able to pace him out of the tachycardia. He was not unstable. I have offered him admission to the hospital for adjustment of his AA drug regimen. He refuses. I will ask him to increase his dose of amiodarone and start mexitil 200 mg tid. 2. Chronic systolic heart failure - his symptoms are class 2. Will follow. 3. Atrial fib - he is maintaining NSR. 4. ICD - his [employer] device is working normally. I have left his VT therapy above the rate of his slow VT so as to not get any unnecessary ATP or shocks.  Mikle Bosworth.D.

## 2016-07-21 NOTE — Telephone Encounter (Signed)
Reviewed with Dr. Lovena Le and he would like pt to have device clinic appointment today.  I spoke with pt and appt made for 3:00 today in the device clinic

## 2016-07-21 NOTE — Telephone Encounter (Signed)
Patient calling, would like to discuss the "periods of AFIB" that he has been experiencing. Patient states that his most episode of AFIB was last night, please call to discuss.

## 2016-07-21 NOTE — Telephone Encounter (Signed)
I spoke with pt. He reports he has been having recent episodes of atrial fib. Started Saturday. Not continuous. Comes and goes. States heart rate has only been over 100 (101) one time.  Reports BP is lower when heart rate elevated. Reports readings of 97/70, 108/79,102/71 and 111/68 yesterday.  Heart rate ranged from 53-93. Is short of breath when in Afib. On Saturday he was having shortness of breath and afib. Episode of chest pain with this. Called EMS but when they arrived he was back in Sinus rhythm. Had another episode last night in the grocery store while pushing cart. Today his heart rate is regular.  Does not have a way to send transmission. Will review with Dr. Lovena Le.

## 2016-07-23 LAB — CUP PACEART INCLINIC DEVICE CHECK
HighPow Impedance: 43 Ohm
Implantable Lead Location: 753859
Implantable Lead Model: 185
Implantable Lead Model: 4135
Implantable Lead Serial Number: 339643
Lead Channel Setting Pacing Amplitude: 2 V
MDC IDC LEAD IMPLANT DT: 20110610
MDC IDC LEAD IMPLANT DT: 20110610
MDC IDC LEAD LOCATION: 753860
MDC IDC LEAD SERIAL: 28681386
MDC IDC PG IMPLANT DT: 20110610
MDC IDC SESS DTM: 20180424040000
MDC IDC SET LEADCHNL RV PACING AMPLITUDE: 2 V
MDC IDC SET LEADCHNL RV PACING PULSEWIDTH: 0.4 ms
MDC IDC SET LEADCHNL RV SENSING SENSITIVITY: 0.6 mV
Pulse Gen Serial Number: 164892

## 2016-07-23 NOTE — Addendum Note (Signed)
Addended by: Stanton Kidney on: 07/23/2016 01:03 PM   Modules accepted: Orders

## 2016-07-28 ENCOUNTER — Other Ambulatory Visit: Payer: Self-pay | Admitting: Internal Medicine

## 2016-07-30 ENCOUNTER — Encounter: Payer: Self-pay | Admitting: Internal Medicine

## 2016-07-30 ENCOUNTER — Ambulatory Visit (INDEPENDENT_AMBULATORY_CARE_PROVIDER_SITE_OTHER): Payer: Medicare Other | Admitting: Internal Medicine

## 2016-07-30 VITALS — BP 92/54 | HR 54 | Ht 71.0 in | Wt 222.0 lb

## 2016-07-30 DIAGNOSIS — I5022 Chronic systolic (congestive) heart failure: Secondary | ICD-10-CM | POA: Diagnosis not present

## 2016-07-30 DIAGNOSIS — Z95810 Presence of automatic (implantable) cardiac defibrillator: Secondary | ICD-10-CM | POA: Diagnosis not present

## 2016-07-30 DIAGNOSIS — I255 Ischemic cardiomyopathy: Secondary | ICD-10-CM | POA: Diagnosis not present

## 2016-07-30 DIAGNOSIS — I10 Essential (primary) hypertension: Secondary | ICD-10-CM

## 2016-07-30 MED ORDER — MEXILETINE HCL 200 MG PO CAPS: 200.0000 mg | ORAL_CAPSULE | Freq: Two times a day (BID) | ORAL | 12 refills | Status: DC

## 2016-07-30 NOTE — Patient Instructions (Addendum)
Medication Instructions:  Your physician has recommended you make the following change in your medication:   1) DECREASE mexiletine 200 mg to twice a day.   Labwork: None ordered  Testing/Procedures: None ordered  Follow-Up: Your physician recommends that you schedule a follow-up appointment in: 3 months with device clinic.  Your physician recommends that you schedule a follow-up appointment in: September with Dr. Lovena Le.    Any Other Special Instructions Will Be Listed Below (If Applicable).     If you need a refill on your cardiac medications before your next appointment, please call your pharmacy.

## 2016-07-30 NOTE — Progress Notes (Signed)
HPI Steven Guzman presents today for ongoing followup of his VT. He is a pleasant 76 yo man with chronic systolic heart failure, VT, s/p ICD implant, s/p VT ablation over 2 years ago. The patient has developed palpitations and was seen a couple of weeks ago and was found to be in a slow VT which he tolerated. I offered him a visit to the hospital but he refused. He was started on Mexitil and his VT resolved and has not returned. He had his dose of amiodarone reduce when I saw him about 8 weeks ago. He denies chest pain and has not had syncope or an ICD shock. Allergies  Allergen Reactions  . Sulfa Antibiotics Hives     Current Outpatient Prescriptions  Medication Sig Dispense Refill  . albuterol (PROVENTIL) (2.5 MG/3ML) 0.083% nebulizer solution Take 3 mLs (2.5 mg total) by nebulization every 6 (six) hours as needed for wheezing or shortness of breath. 360 mL 12  . amiodarone (PACERONE) 200 MG tablet Take 1 tablet (200 mg) by mouth daily Monday through Friday and 1/2 tablet (100 mg) on Saturday and Sunday 90 tablet 3  . atorvastatin (LIPITOR) 80 MG tablet take 1 tablet by mouth once daily 30 tablet 9  . benazepril (LOTENSIN) 10 MG tablet take 1 tablet by mouth once daily 90 tablet 2  . budesonide-formoterol (SYMBICORT) 160-4.5 MCG/ACT inhaler inhale 2 puffs by mouth every morning and 2 puffs every evening ; SEPARATE DOSES BY 12 HOURS 10.2 Inhaler 5  . buPROPion (WELLBUTRIN) 75 MG tablet Take 1 tablet (75 mg total) by mouth daily. 30 tablet 6  . busPIRone (BUSPAR) 15 MG tablet take 1 tablet by mouth three times a day 90 tablet 5  . carvedilol (COREG) 6.25 MG tablet Take 1 tablet (6.25 mg total) by mouth 2 (two) times daily. 180 tablet 3  . cetirizine (ZYRTEC) 10 MG tablet Take 10 mg by mouth daily as needed for allergies.    . Choline Fenofibrate (FENOFIBRIC ACID) 135 MG CPDR take 1 capsule by mouth once daily 30 capsule 9  . escitalopram (LEXAPRO) 20 MG tablet take 1 tablet by mouth once  daily 30 tablet 6  . fluticasone (FLONASE) 50 MCG/ACT nasal spray Place 2 sprays into both nostrils daily.    . furosemide (LASIX) 40 MG tablet take 1/2 tablet by mouth once daily 45 tablet 3  . gabapentin (NEURONTIN) 100 MG capsule take 3 capsules by mouth twice a day 540 capsule 2  . Ipratropium-Albuterol (COMBIVENT RESPIMAT) 20-100 MCG/ACT AERS respimat Inhale 1 puff into the lungs every 6 (six) hours. Shortness of breath or wheezing    . mexiletine (MEXITIL) 200 MG capsule Take 1 capsule (200 mg total) by mouth 3 (three) times daily. 90 capsule 12  . nitroGLYCERIN (NITROSTAT) 0.4 MG SL tablet Place 1 tablet (0.4 mg total) under the tongue every 5 (five) minutes x 3 doses as needed for chest pain. 25 tablet 11  . omeprazole (PRILOSEC) 20 MG capsule Take 20 mg by mouth 2 (two) times daily.    . Pancrelipase, Lip-Prot-Amyl, (CREON) 24000 units CPEP 1 po tid w/meals (Patient taking differently: 1 po tid w/meals as needed) 90 capsule 3  . promethazine-codeine (PHENERGAN WITH CODEINE) 6.25-10 MG/5ML syrup Take 5 mLs by mouth every 4 (four) hours as needed. 300 mL 0  . SPIRIVA HANDIHALER 18 MCG inhalation capsule inhale the contents of one capsule in the handihaler once daily 30 capsule 5  . tamsulosin (FLOMAX) 0.4  MG CAPS capsule take 1 capsule by mouth once daily after SUPPER 30 capsule 1  . XARELTO 20 MG TABS tablet take 1 tablet by mouth once daily with SUPPER 30 tablet 6   No current facility-administered medications for this visit.      Past Medical History:  Diagnosis Date  . AICD (automatic cardioverter/defibrillator) present   . Aneurysm ([place])    abdominal <4. 3.6 cm on ultrasound 09/2014.   . Anginal pain ([place])   . Anxiety   . Basal cell carcinoma of nose    S/P MOHS  . Biliary acute pancreatitis   . CAD (coronary artery disease)    a. s/p MI in 1994 with PCI to LAD at that time b. cath 10/2012 demonstrated EF 30%, inferior akinesis with mild hypokinesis of all walls, patent LAD and  RCA stents; ostial PDA with 80-90% obstruction with medical therapy recommended   . Chronic bronchitis ([place])    "was having it q yr; haven't had it in a couple years" (12/21/2014)  . Chronic systolic CHF (congestive heart failure) (HCC)    EF 30 to 35 % as of 09/2014.   [name] Complication of anesthesia 10/2014   "had to have defibrillator w/ERCP"  . COPD (chronic obstructive pulmonary disease) ([place])    a. followed by pulmonary, COPD GOLD stage II  . Depression   . Diverticulosis of colon 07/2014   noted on CT  . GERD (gastroesophageal reflux disease)   . Hiatal hernia   . Hyperglycemia 10/2012.  [name] Hyperlipidemia   . Hypertension   . Myocardial infarction [facility]) 1994; 2011  . Pneumonia 1946; 2015  . Prostate enlargement 07/2014   observed on CT  . Tobacco abuse   . Ventricular tachycardia ([place])    a. 08/2009 s/p BSX E110 Teligen 100 AICD, ser#: [id number];  b. 08/2008 VT req ATP - detection reprogrammed from 160 to 150. c. EPS and VT ablation by Dr. Lovena Le 12/21/2014    ROS:   All systems reviewed and negative except as noted in the HPI.   Past Surgical History:  Procedure Laterality Date  . CATARACT EXTRACTION W/ INTRAOCULAR LENS  IMPLANT, BILATERAL Bilateral ~ 2011  . COLONOSCOPY    . ELECTROPHYSIOLOGIC STUDY N/A 12/21/2014   Procedure: V Tach Ablation;  Surgeon: [name], MD;  Location: [place] [facility];  Service: Cardiovascular;  Laterality: N/A;  . ERCP N/A 11/16/2014   Procedure: ENDOSCOPIC RETROGRADE CHOLANGIOPANCREATOGRAPHY (ERCP);  Surgeon: [name], MD;  Location: [place];  Service: Endoscopy;  Laterality: N/A;  . EYE SURGERY    . FOOT SURGERY Left 2005   "fixed bone that stuck out in my ankle area"  . HEMORRHOID BANDING    . IMPLANTABLE CARDIOVERTER DEFIBRILLATOR IMPLANT  09/06/09   BSX dual chamber ICD implanted in [place] for cardiac arrest 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  . INGUINAL HERNIA REPAIR Right ~ 1995  . LEFT HEART CATHETERIZATION WITH CORONARY ANGIOGRAM N/A  11/25/2012   demonstrated EF 30%, inferior akinesis with mild hypokinesis of all walls, patent LAD and RCA stents; ostial PDA with 80-90% obstruction with medical therapy recommended  . MOHS SURGERY  2008   nose, skin graft  . RETINAL DETACHMENT SURGERY Right 2013  . TENOLYSIS Right 12/21/2013   Procedure: TENOLYSIS FLEXOR CARPI RADIALIS ,DEBRIDEMENT RIGHT JOINT WRIST,DEBRIDEMENT SCAPHOTRAPEZIAL TRAPEZOID, REPAIR OF EXTENSOR HOOD;  Surgeon: Daryll Brod, MD;  Location: [place];  Service: Orthopedics;  Laterality: Right;  . V-TACH ABLATION  12/21/2014  .  VIDEO BRONCHOSCOPY Bilateral 01/09/2016   Procedure: VIDEO BRONCHOSCOPY WITHOUT FLUORO;  Surgeon: Juanito Doom, MD;  Location[facility];  Service: Cardiopulmonary;  Laterality: Bilateral;     Family History  Problem Relation Age of Onset  . Heart attack Brother   . CAD Father   . Hypertension Father   . CAD Mother   . Hypertension Mother   . Hypertension Brother   . Stroke Neg Hx      Social History   Social History  . Marital status: Married    Spouse name: N/A  . Number of children: N/A  . Years of education: N/A   Occupational History  . Retired    Social History Main Topics  . Smoking status: Current Every Day Smoker    Packs/day: 1.00    Years: 55.00    Types: Cigarettes  . Smokeless tobacco: Never Used     Comment: had quit, recently bought a pack  . Alcohol use 0.0 oz/week     Comment: 12/21/2014 "might have a couple glasses of wine/year"  . Drug use: No  . Sexual activity: Not Currently   Other Topics Concern  . Not on file   Social History Narrative  . No narrative on file     BP (!) 92/54   Pulse (!) 54   Ht 5\' 11"  (1.803 m)   Wt 222 lb (100.7 kg)   SpO2 94%   BMI 30.96 kg/m   Physical Exam:  Well appearing 76 yo man, NAD HEENT: Unremarkable Neck:  6 cm JVD, no thyromegally Lymphatics:  No adenopathy Back:  No CVA tenderness Lungs:  Clear with no wheezes HEART:   Regular tachy rhythm, no murmurs, no rubs, no clicks Abd:  soft, positive bowel sounds, no organomegally, no rebound, no guarding Ext:  2 plus pulses, no edema, no cyanosis, no clubbing Skin:  No rashes no nodules Neuro:  CN II through XII intact, motor grossly intact  EKG - not done today  DEVICE - no interogation today  Assess/Plan: 1. VT - the patient has improved. He is taking Mexitil 200 bid as he cannot remember the middle of the day dose. 2. Chronic systolic heart failure - his symptoms are class 2. Will follow. 3. Atrial fib - he is maintaining NSR. 4. ICD - his [place] device is working normally. I have left his VT therapy above the rate of his slow VT so as to not get any unnecessary ATP or shocks.  Mikle Bosworth.D.

## 2016-08-06 ENCOUNTER — Other Ambulatory Visit: Payer: Self-pay | Admitting: Pulmonary Disease

## 2016-08-14 ENCOUNTER — Other Ambulatory Visit: Payer: Self-pay | Admitting: Interventional Cardiology

## 2016-08-14 ENCOUNTER — Telehealth: Payer: Self-pay | Admitting: Internal Medicine

## 2016-08-14 MED ORDER — MEXILETINE HCL 200 MG PO CAPS: 200.0000 mg | ORAL_CAPSULE | Freq: Two times a day (BID) | ORAL | 3 refills | Status: DC

## 2016-08-14 NOTE — Telephone Encounter (Signed)
New message  Pharmacy Calling  Pt c/o medication issue:  1. Name of Medication: mexiletine (MEXITIL) 200 MG capsule  2. How are you currently taking this medication (dosage and times per day)? n/a  3. Are you having a reaction (difficulty breathing--STAT)? n/a  4. What is your medication issue? Pharmacy has dosage questions related to the above medications and how it was sent to the pharmacy

## 2016-08-17 ENCOUNTER — Ambulatory Visit: Payer: Medicare Other | Admitting: Internal Medicine

## 2016-08-25 DIAGNOSIS — E785 Hyperlipidemia, unspecified: Secondary | ICD-10-CM | POA: Diagnosis not present

## 2016-08-25 DIAGNOSIS — Z8679 Personal history of other diseases of the circulatory system: Secondary | ICD-10-CM | POA: Diagnosis not present

## 2016-08-25 DIAGNOSIS — I10 Essential (primary) hypertension: Secondary | ICD-10-CM | POA: Diagnosis not present

## 2016-08-25 DIAGNOSIS — Z79899 Other long term (current) drug therapy: Secondary | ICD-10-CM | POA: Diagnosis not present

## 2016-08-25 DIAGNOSIS — I4891 Unspecified atrial fibrillation: Secondary | ICD-10-CM | POA: Diagnosis not present

## 2016-08-25 DIAGNOSIS — Z7982 Long term (current) use of aspirin: Secondary | ICD-10-CM | POA: Diagnosis not present

## 2016-08-25 DIAGNOSIS — K219 Gastro-esophageal reflux disease without esophagitis: Secondary | ICD-10-CM | POA: Diagnosis not present

## 2016-08-25 DIAGNOSIS — I252 Old myocardial infarction: Secondary | ICD-10-CM | POA: Diagnosis not present

## 2016-08-25 DIAGNOSIS — R600 Localized edema: Secondary | ICD-10-CM | POA: Diagnosis not present

## 2016-08-25 DIAGNOSIS — S29011A Strain of muscle and tendon of front wall of thorax, initial encounter: Secondary | ICD-10-CM | POA: Diagnosis not present

## 2016-08-25 DIAGNOSIS — F17210 Nicotine dependence, cigarettes, uncomplicated: Secondary | ICD-10-CM | POA: Diagnosis not present

## 2016-08-25 DIAGNOSIS — R2241 Localized swelling, mass and lump, right lower limb: Secondary | ICD-10-CM | POA: Diagnosis not present

## 2016-08-25 DIAGNOSIS — Z8673 Personal history of transient ischemic attack (TIA), and cerebral infarction without residual deficits: Secondary | ICD-10-CM | POA: Diagnosis not present

## 2016-08-25 DIAGNOSIS — R079 Chest pain, unspecified: Secondary | ICD-10-CM | POA: Diagnosis not present

## 2016-09-02 ENCOUNTER — Other Ambulatory Visit: Payer: Self-pay | Admitting: Internal Medicine

## 2016-09-02 NOTE — Telephone Encounter (Signed)
Please advise, last visit note stated for him to follow up around 08/16/2016

## 2016-09-09 ENCOUNTER — Other Ambulatory Visit: Payer: Self-pay | Admitting: Internal Medicine

## 2016-09-09 ENCOUNTER — Other Ambulatory Visit: Payer: Self-pay | Admitting: Interventional Cardiology

## 2016-09-09 ENCOUNTER — Encounter: Payer: Self-pay | Admitting: Internal Medicine

## 2016-09-10 ENCOUNTER — Encounter: Payer: Self-pay | Admitting: Internal Medicine

## 2016-09-10 ENCOUNTER — Telehealth: Payer: Self-pay | Admitting: Internal Medicine

## 2016-09-10 NOTE — Telephone Encounter (Signed)
New message    Pt is calling stating he is returning call. He isn't sure what it's about.

## 2016-09-10 NOTE — Telephone Encounter (Signed)
Called, pt unavailable. Left voice message for pt to call back.

## 2016-09-11 NOTE — Telephone Encounter (Signed)
Called pharmacy to give verbal refills on gabapentin and lexapro. Spoke with wife to let her know.

## 2016-09-14 ENCOUNTER — Telehealth: Payer: Self-pay | Admitting: Internal Medicine

## 2016-09-14 ENCOUNTER — Observation Stay (HOSPITAL_COMMUNITY)
Admission: EM | Admit: 2016-09-14 | Discharge: 2016-09-15 | Disposition: A | Discharge status: Home / Self Care | Payer: Medicare Other | Attending: Internal Medicine | Admitting: Internal Medicine

## 2016-09-14 ENCOUNTER — Emergency Department (HOSPITAL_COMMUNITY): Payer: Medicare Other

## 2016-09-14 ENCOUNTER — Encounter (HOSPITAL_COMMUNITY): Payer: Self-pay | Admitting: Emergency Medicine

## 2016-09-14 DIAGNOSIS — Z882 Allergy status to sulfonamides status: Secondary | ICD-10-CM | POA: Insufficient documentation

## 2016-09-14 DIAGNOSIS — Z85828 Personal history of other malignant neoplasm of skin: Secondary | ICD-10-CM | POA: Insufficient documentation

## 2016-09-14 DIAGNOSIS — I110 Hypertensive heart disease with heart failure: Secondary | ICD-10-CM | POA: Diagnosis not present

## 2016-09-14 DIAGNOSIS — Z95810 Presence of automatic (implantable) cardiac defibrillator: Secondary | ICD-10-CM | POA: Insufficient documentation

## 2016-09-14 DIAGNOSIS — I5022 Chronic systolic (congestive) heart failure: Secondary | ICD-10-CM | POA: Diagnosis not present

## 2016-09-14 DIAGNOSIS — N400 Enlarged prostate without lower urinary tract symptoms: Secondary | ICD-10-CM | POA: Insufficient documentation

## 2016-09-14 DIAGNOSIS — I472 Ventricular tachycardia, unspecified: Secondary | ICD-10-CM

## 2016-09-14 DIAGNOSIS — J449 Chronic obstructive pulmonary disease, unspecified: Secondary | ICD-10-CM | POA: Insufficient documentation

## 2016-09-14 DIAGNOSIS — I2510 Atherosclerotic heart disease of native coronary artery without angina pectoris: Secondary | ICD-10-CM | POA: Insufficient documentation

## 2016-09-14 DIAGNOSIS — E785 Hyperlipidemia, unspecified: Secondary | ICD-10-CM | POA: Insufficient documentation

## 2016-09-14 DIAGNOSIS — Z8249 Family history of ischemic heart disease and other diseases of the circulatory system: Secondary | ICD-10-CM | POA: Diagnosis not present

## 2016-09-14 DIAGNOSIS — I252 Old myocardial infarction: Secondary | ICD-10-CM | POA: Insufficient documentation

## 2016-09-14 DIAGNOSIS — K219 Gastro-esophageal reflux disease without esophagitis: Secondary | ICD-10-CM | POA: Diagnosis not present

## 2016-09-14 DIAGNOSIS — F419 Anxiety disorder, unspecified: Secondary | ICD-10-CM | POA: Diagnosis not present

## 2016-09-14 DIAGNOSIS — I499 Cardiac arrhythmia, unspecified: Secondary | ICD-10-CM | POA: Diagnosis present

## 2016-09-14 DIAGNOSIS — Z955 Presence of coronary angioplasty implant and graft: Secondary | ICD-10-CM | POA: Diagnosis not present

## 2016-09-14 DIAGNOSIS — Z8674 Personal history of sudden cardiac arrest: Secondary | ICD-10-CM | POA: Insufficient documentation

## 2016-09-14 DIAGNOSIS — Z7951 Long term (current) use of inhaled steroids: Secondary | ICD-10-CM | POA: Insufficient documentation

## 2016-09-14 DIAGNOSIS — Z7901 Long term (current) use of anticoagulants: Secondary | ICD-10-CM | POA: Insufficient documentation

## 2016-09-14 DIAGNOSIS — F17210 Nicotine dependence, cigarettes, uncomplicated: Secondary | ICD-10-CM | POA: Diagnosis not present

## 2016-09-14 DIAGNOSIS — F329 Major depressive disorder, single episode, unspecified: Secondary | ICD-10-CM | POA: Diagnosis not present

## 2016-09-14 DIAGNOSIS — R0602 Shortness of breath: Secondary | ICD-10-CM

## 2016-09-14 LAB — BASIC METABOLIC PANEL
ANION GAP: 9 (ref 5–15)
BUN: 9 mg/dL (ref 6–20)
CO2: 24 mmol/L (ref 22–32)
Calcium: 9.3 mg/dL (ref 8.9–10.3)
Chloride: 102 mmol/L (ref 101–111)
Creatinine, Ser: 1.33 mg/dL — ABNORMAL HIGH (ref 0.61–1.24)
GFR calc Af Amer: 58 mL/min — ABNORMAL LOW (ref >60)
GFR, EST NON AFRICAN AMERICAN: 50 mL/min — AB (ref >60)
GLUCOSE: 106 mg/dL — AB (ref 65–99)
Potassium: 3.7 mmol/L (ref 3.5–5.1)
Sodium: 135 mmol/L (ref 135–145)

## 2016-09-14 LAB — PROTIME-INR
INR: 2.47
Prothrombin Time: 27.2 seconds — ABNORMAL HIGH (ref 11.4–15.2)

## 2016-09-14 LAB — CBC
HEMATOCRIT: 41.5 % (ref 39.0–52.0)
HEMOGLOBIN: 13.2 g/dL (ref 13.0–17.0)
MCH: 28.6 pg (ref 26.0–34.0)
MCHC: 31.8 g/dL (ref 30.0–36.0)
MCV: 90 fL (ref 78.0–100.0)
Platelets: 249 10*3/uL (ref 150–400)
RBC: 4.61 MIL/uL (ref 4.22–5.81)
RDW: 15.8 % — ABNORMAL HIGH (ref 11.5–15.5)
WBC: 9 10*3/uL (ref 4.0–10.5)

## 2016-09-14 LAB — I-STAT TROPONIN, ED: Troponin i, poc: 0.01 ng/mL (ref 0.00–0.08)

## 2016-09-14 MED ORDER — AMIODARONE IV BOLUS ONLY 150 MG/100ML
150.0000 mg | Freq: Once | INTRAVENOUS | Status: AC
  Administered 2016-09-14: 150 mg via INTRAVENOUS
  Filled 2016-09-14: qty 100

## 2016-09-14 NOTE — Telephone Encounter (Signed)
[facility] After Hours Note  Pt of Dr. Lovena Le with a hx of slow VT s/p VT ablation and ICD in the past, who calls to state that he feels like he has been in VT since about 6 PM tonight.  He has taken his amiodarone and mexiletine today, but continues to have feelings of anxiety and mild shortness of breath.  His pulse has been in the 90s and he denies significant lightheadedness unless he makes quick movements.  I advised that he would need to come to the ED for further evaluation should his symptoms continue.  He voiced understanding, appreciation, and willingness to comply.  Regino Bellow

## 2016-09-14 NOTE — ED Provider Notes (Signed)
Steven Guzman Provider Note   CSN: [id number] Arrival date & time: 09/14/16  2257  By signing my name below, I, Steven Guzman, attest that this documentation has been prepared under the direction and in the presence of Horton, Barbette Hair, MD. Electronically Signed: Collene Guzman, Scribe. 09/14/16. 11:27 PM.  History   Chief Complaint Chief Complaint  Patient presents with  . Shortness of Breath  . Code STEMI   HPI Comments: Steven Guzman is a 76 y.o. male with a history of CAD with stent placement, CHF, COPD, MI, and ventricular tachycardia, who presents to the Emergency Department complaining of sudden-onset, intermittent shortness of breath that began at 6 pm tonight. Patient states he has a history of ventricular tachycardia, he believes he is going into v-tach currently due to his symptoms of shortness of breath, palpations, and waxing and waning blood pressure. Patient denies any chest pain, chest pressure, or diaphoresis at this time. Patient has had two stent placements and a pacemaker in place. Patient denies any fever, chills, nausea, vomiting, or any additional symptoms. Reports that the symptoms are not similar to his prior stent placement.  Code STEMI was activated because of inferior ST elevations. However, upon further evaluation and evaluation of EKGs not available in MUSE, he has had prior EKGs in the past. Likely slow V. Tach.  Code STEMI canceled.  The history is provided by the patient. No language interpreter was used.    Past Medical History:  Diagnosis Date  . AICD (automatic cardioverter/defibrillator) present   . Aneurysm ([place])    abdominal <4. 3.6 cm on ultrasound 09/2014.   . Anginal pain ([place])   . Anxiety   . Basal cell carcinoma of nose    S/P MOHS  . Biliary acute pancreatitis   . CAD (coronary artery disease)    a. s/p MI in 1994 with PCI to LAD at that time b. cath 10/2012 demonstrated EF 30%, inferior akinesis with mild hypokinesis of all  walls, patent LAD and RCA stents; ostial PDA with 80-90% obstruction with medical therapy recommended   . Chronic bronchitis ([place])    "was having it q yr; haven't had it in a couple years" (12/21/2014)  . Chronic systolic CHF (congestive heart failure) (HCC)    EF 30 to 35 % as of 09/2014.   [name] Complication of anesthesia 10/2014   "had to have defibrillator w/ERCP"  . COPD (chronic obstructive pulmonary disease) ([place])    a. followed by pulmonary, COPD GOLD stage II  . Depression   . Diverticulosis of colon 07/2014   noted on CT  . GERD (gastroesophageal reflux disease)   . Hiatal hernia   . Hyperglycemia 10/2012.  [name] Hyperlipidemia   . Hypertension   . Myocardial infarction [facility]) 1994; 2011  . Pneumonia 1946; 2015  . Prostate enlargement 07/2014   observed on CT  . Tobacco abuse   . Ventricular tachycardia ([place])    a. 08/2009 s/p BSX E110 Teligen 100 AICD, ser#: [id number];  b. 08/2008 VT req ATP - detection reprogrammed from 160 to 150. c. EPS and VT ablation by Dr. Lovena Le 12/21/2014    Patient Active Problem List   Diagnosis Date Noted  . Arrhythmia 09/15/2016  . Dizzinesses 05/22/2016  . Pancreatitis, chronic ([place]) 08/02/2015  . Mass of throat 06/30/2015  . Depression 05/24/2015  . Atrial fibrillation ([place]) 03/15/2015  . Hemoptysis 03/16/2014  . COPD GOLD GRADE C 12/25/2013  . Chronic systolic heart failure ([place]) 07/07/2013  . CAD (  coronary artery disease) 11/23/2012  . Cigarette smoker 11/23/2012  . Ventricular tachycardia ([place]) 11/23/2012  . Essential hypertension 11/23/2012  . Hyperlipidemia 11/23/2012  . ICD (implantable cardioverter-defibrillator) in place 11/23/2012    Past Surgical History:  Procedure Laterality Date  . CATARACT EXTRACTION W/ INTRAOCULAR LENS  IMPLANT, BILATERAL Bilateral ~ 2011  . COLONOSCOPY    . ELECTROPHYSIOLOGIC STUDY N/A 12/21/2014   Procedure: V Tach Ablation;  Surgeon: [name], MD;  Location: [place] [facility];  Service: Cardiovascular;   Laterality: N/A;  . ERCP N/A 11/16/2014   Procedure: ENDOSCOPIC RETROGRADE CHOLANGIOPANCREATOGRAPHY (ERCP);  Surgeon: [name], MD;  Location: [place];  Service: Endoscopy;  Laterality: N/A;  . EYE SURGERY    . FOOT SURGERY Left 2005   "fixed bone that stuck out in my ankle area"  . HEMORRHOID BANDING    . IMPLANTABLE CARDIOVERTER DEFIBRILLATOR IMPLANT  09/06/09   BSX dual chamber ICD implanted in [place] for cardiac arrest 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  . INGUINAL HERNIA REPAIR Right ~ 1995  . LEFT HEART CATHETERIZATION WITH CORONARY ANGIOGRAM N/A 11/25/2012   demonstrated EF 30%, inferior akinesis with mild hypokinesis of all walls, patent LAD and RCA stents; ostial PDA with 80-90% obstruction with medical therapy recommended  . MOHS SURGERY  2008   nose, skin graft  . RETINAL DETACHMENT SURGERY Right 2013  . TENOLYSIS Right 12/21/2013   Procedure: TENOLYSIS FLEXOR CARPI RADIALIS ,DEBRIDEMENT RIGHT JOINT WRIST,DEBRIDEMENT SCAPHOTRAPEZIAL TRAPEZOID, REPAIR OF EXTENSOR HOOD;  Surgeon: Daryll Brod, MD;  Location: [place];  Service: Orthopedics;  Laterality: Right;  . V-TACH ABLATION  12/21/2014  . VIDEO BRONCHOSCOPY Bilateral 01/09/2016   Procedure: VIDEO BRONCHOSCOPY WITHOUT FLUORO;  Surgeon: Juanito Doom, MD;  [facility];  Service: Cardiopulmonary;  Laterality: Bilateral;       Home Medications    Prior to Admission medications   Medication Sig Start Date End Date Taking? Authorizing Provider  albuterol (PROVENTIL) (2.5 MG/3ML) 0.083% nebulizer solution Take 3 mLs (2.5 mg total) by nebulization every 6 (six) hours as needed for wheezing or shortness of breath. 05/22/16  Yes Juanito Doom, MD  amiodarone (PACERONE) 200 MG tablet Take 1 tablet (200 mg) by mouth daily Monday through Friday and 1/2 tablet (100 mg) on Saturday and Sunday Patient taking differently: Take 100-200 mg by mouth See admin instructions. Take 1 tablet (200 mg) by mouth daily  Monday through Friday and 1/2 tablet (100 mg) on Saturday and Sunday 06/30/16  Yes [name], MD  atorvastatin (LIPITOR) 80 MG tablet take 1 tablet by mouth once daily 10/28/15  Yes [name], MD  benazepril (LOTENSIN) 10 MG tablet take 1 tablet by mouth once daily 08/14/16  Yes Belva Crome, MD  buPROPion [facility]) 75 MG tablet TAKE 1 TABLET BY MOUTH ONCE DAILY 09/10/16  Yes Golden Circle, FNP  busPIRone (BUSPAR) 15 MG tablet take 1 tablet by mouth three times a day 07/28/16  Yes Hoyt Koch, MD  carvedilol (COREG) 6.25 MG tablet Take 1 tablet (6.25 mg total) by mouth 2 (two) times daily. Patient taking differently: Take 3.125 mg by mouth 2 (two) times daily with a meal.  06/30/16  Yes [name], MD  cetirizine (ZYRTEC) 10 MG tablet Take 10 mg by mouth daily as needed for allergies.   Yes [provider]  Choline Fenofibrate (FENOFIBRIC ACID) 135 MG CPDR take 1 capsule by mouth once daily 10/28/15  Yes Tamala Julian,  Lynnell Dike, MD  escitalopram (LEXAPRO) 20 MG tablet TAKE 1 TABLET BY MOUTH ONCE DAILY 09/02/16  Yes Hoyt Koch, MD  fluticasone [facility]) 50 MCG/ACT nasal spray Place 2 sprays into both nostrils daily.   Yes [provider]  furosemide (LASIX) 40 MG tablet take 1/2 tablet by mouth once daily 07/13/16  Yes Belva Crome, MD  gabapentin (NEURONTIN) 100 MG capsule TAKE 3 CAPSULES BY MOUTH TWICE A DAY 09/02/16  Yes Hoyt Koch, MD  Ipratropium-Albuterol (COMBIVENT RESPIMAT) 20-100 MCG/ACT AERS respimat Inhale 1 puff into the lungs every 6 (six) hours. Shortness of breath or wheezing   Yes [provider]  mexiletine (MEXITIL) 200 MG capsule Take 1 capsule (200 mg total) by mouth 2 (two) times daily. 08/14/16  Yes [name], MD  nitroGLYCERIN (NITROSTAT) 0.4 MG SL tablet Place 1 tablet (0.4 mg total) under the tongue every 5 (five) minutes x 3 doses as needed for chest pain. 03/15/15  Yes [name], MD  omeprazole  (PRILOSEC) 20 MG capsule Take 20 mg by mouth 2 (two) times daily.   Yes [provider]  SPIRIVA HANDIHALER 18 MCG inhalation capsule inhale the contents of one capsule in the handihaler once daily 03/26/16  Yes Young, Clinton D, MD  SYMBICORT 160-4.5 MCG/ACT inhaler inhale 2 puffs by mouth every morning then 2 puffs every evening SEPARTE DOSES BY 12 HOURS 08/06/16  Yes Juanito Doom, MD  tamsulosin (FLOMAX) 0.4 MG CAPS capsule TAKE 1 CAPSULE BY MOUTH ONCE DAILY AFTER SUPPER 09/10/16  Yes Golden Circle, FNP  XARELTO 20 MG TABS tablet take 1 tablet by mouth once daily with SUPPER 04/07/16  Yes Belva Crome, MD    Family History Family History  Problem Relation Age of Onset  . Heart attack Brother   . CAD Father   . Hypertension Father   . CAD Mother   . Hypertension Mother   . Hypertension Brother   . Stroke Neg Hx     Social History Social History  Substance Use Topics  . Smoking status: Current Every Day Smoker    Packs/day: 1.00    Years: 55.00    Types: Cigarettes  . Smokeless tobacco: Never Used     Comment: had quit, recently bought a pack  . Alcohol use 0.0 oz/week     Comment: 12/21/2014 "might have a couple glasses of wine/year"     Allergies   Sulfa antibiotics   Review of Systems Review of Systems  Constitutional: Negative for chills, diaphoresis and fever.  Respiratory: Positive for shortness of breath.   Cardiovascular: Positive for palpitations. Negative for chest pain.  Gastrointestinal: Negative for nausea and vomiting.  All other systems reviewed and are negative.    Physical Exam Updated Vital Signs BP 97/68   Pulse 61   Resp (!) 22   Ht 5\' 11"  (1.803 m)   Wt 99.8 kg (220 lb)   SpO2 91%   BMI 30.68 kg/m   Physical Exam  Constitutional: He is oriented to person, place, and time. He appears well-developed and well-nourished. No distress.  HENT:  Head: Normocephalic and atraumatic.  Neck: Neck supple.  Cardiovascular: Normal  rate, regular rhythm and normal heart sounds.   No murmur heard. Pulmonary/Chest: Effort normal and breath sounds normal. No respiratory distress. He has no wheezes.  Pacemaker palpated in chest  Abdominal: Soft. There is no tenderness.  Musculoskeletal: He exhibits no edema.  Neurological: He is alert and oriented  to person, place, and time.  Skin: Skin is warm and dry.  Psychiatric: He has a normal mood and affect.  Nursing note and vitals reviewed.    ED Treatments / Results  DIAGNOSTIC STUDIES: Oxygen Saturation is 98% on RA, normal by my interpretation.    COORDINATION OF CARE: 11:26 PM Discussed treatment plan with pt at bedside and pt agreed to plan, which includes amiodarone.   Labs (all labs ordered are listed, but only abnormal results are displayed) Labs Reviewed  BASIC METABOLIC PANEL - Abnormal; Notable for the following:       Result Value   Glucose, Bld 106 (*)    Creatinine, Ser 1.33 (*)    GFR calc non Af Amer 50 (*)    GFR calc Af Amer 58 (*)    All other components within normal limits  CBC - Abnormal; Notable for the following:    RDW 15.8 (*)    All other components within normal limits  PROTIME-INR - Abnormal; Notable for the following:    Prothrombin Time 27.2 (*)    All other components within normal limits  I-STAT TROPOININ, ED    EKG  EKG Interpretation  Date/Time:  Monday September 14 2016 22:58:33 EDT Ventricular Rate:  96 PR Interval:  56 QRS Duration: 104 QT Interval:  388 QTC Calculation: 490 R Axis:   -26 Text Interpretation:  Marked sinus bradycardia with short PR with Fusion complexes Inferior ST elevation  T wave abnormality, consider lateral ischemia Abnormal ECG inferior ST elevation ECG not available in MUSE from 06/2016 with similar morphology Reconfirmed by Thayer Jew [phone]) on 09/15/2016 12:19:24 AM       Radiology Dg Chest 2 View  Result Date: 09/14/2016 CLINICAL DATA:  Shortness of breath. Sudden onset of shortness  breath earlier tonight. EXAM: CHEST  2 VIEW COMPARISON:  05/07/2016 FINDINGS: Left-sided pacemaker remains in place. Stable heart size and mediastinal contours with retrocardiac hiatal hernia. The lungs are hyperinflated. No pulmonary edema, consolidation, pleural fluid or pneumothorax. No acute osseous abnormality. IMPRESSION: Chronic hyperinflation, can be seen with COPD. No acute abnormality. Electronically Signed   By: Jeb Levering M.D.   On: 09/14/2016 23:56    Procedures Procedures (including critical care time)  Medications Ordered in ED Medications  amiodarone (NEXTERONE) IV bolus only 150 mg/100 mL (150 mg Intravenous New Bag/Given 09/14/16 2346)     Initial Impression / Assessment and Plan / ED Course  I have reviewed the triage vital signs and the nursing notes.  Pertinent labs & imaging results that were available during my care of the patient were reviewed by me and considered in my medical decision making (see chart for details).     Patient presents with shortness of breath and symptoms consistent with prior episodes of what he describes as slow V. tach. He is nontoxic. Vital signs reassuring. Blood pressure 106/89. Initial EKG was concerning for inferior pattern. However, after reviewing the chart and discussing with Dr. [place], he does have one EKG from clinic with a similar patterning. He is chest pain-free. Code STEMI was canceled. Cardiology fellow is at the bedside. He believes he may be in slow V. Tach and recommends amiodarone. I ordered one bolus 150. I have also requested pacemaker interrogation. Initial troponin negative. Initial lab work reassuring. Patient will be admitted to the cardiology service.  Final Clinical Impressions(s) / ED Diagnoses   Final diagnoses:  Shortness of breath  V-tach [facility])    New Prescriptions New Prescriptions  No medications on file   I personally performed the services described in this documentation, which was scribed in my  presence. The recorded information has been reviewed and is accurate.    Merryl Hacker, MD 09/15/16 [id number]

## 2016-09-14 NOTE — ED Triage Notes (Signed)
Pt presents C/O "feeling of heart racing" pt states hx of vtach and feels like he is in it. Also endorses SOB. HR noted to be in the 80's in triage.

## 2016-09-14 NOTE — ED Notes (Signed)
Pt also has defibrillator [employer] investment [employer]) and takes amiodarone.

## 2016-09-14 NOTE — ED Notes (Signed)
Dr.Roby, cardiologist at bedside

## 2016-09-14 NOTE — Telephone Encounter (Signed)
See other phone notes.

## 2016-09-14 NOTE — ED Notes (Signed)
Called [facility] to activate STEMI per Dr. Dina Rich

## 2016-09-15 ENCOUNTER — Inpatient Hospital Stay (HOSPITAL_BASED_OUTPATIENT_CLINIC_OR_DEPARTMENT_OTHER): Payer: Medicare Other

## 2016-09-15 DIAGNOSIS — I5022 Chronic systolic (congestive) heart failure: Secondary | ICD-10-CM | POA: Diagnosis not present

## 2016-09-15 DIAGNOSIS — I499 Cardiac arrhythmia, unspecified: Secondary | ICD-10-CM | POA: Insufficient documentation

## 2016-09-15 DIAGNOSIS — I472 Ventricular tachycardia: Secondary | ICD-10-CM

## 2016-09-15 LAB — MAGNESIUM: MAGNESIUM: 1.7 mg/dL (ref 1.7–2.4)

## 2016-09-15 LAB — ECHOCARDIOGRAM COMPLETE
Height: 71 in
Weight: 3418.01 oz

## 2016-09-15 LAB — MRSA PCR SCREENING: MRSA by PCR: NEGATIVE

## 2016-09-15 MED ORDER — CARVEDILOL 6.25 MG PO TABS
6.2500 mg | ORAL_TABLET | Freq: Two times a day (BID) | ORAL | Status: DC
  Administered 2016-09-15: 6.25 mg via ORAL
  Filled 2016-09-15: qty 1

## 2016-09-15 MED ORDER — GABAPENTIN 300 MG PO CAPS
300.0000 mg | ORAL_CAPSULE | Freq: Two times a day (BID) | ORAL | Status: DC
  Administered 2016-09-15: 300 mg via ORAL
  Filled 2016-09-15: qty 1

## 2016-09-15 MED ORDER — ALBUTEROL SULFATE (2.5 MG/3ML) 0.083% IN NEBU: 2.5000 mg | INHALATION_SOLUTION | Freq: Four times a day (QID) | RESPIRATORY_TRACT | Status: DC | PRN

## 2016-09-15 MED ORDER — LORATADINE 10 MG PO TABS
10.0000 mg | ORAL_TABLET | Freq: Every day | ORAL | Status: DC
  Filled 2016-09-15: qty 1

## 2016-09-15 MED ORDER — BUSPIRONE HCL 15 MG PO TABS
15.0000 mg | ORAL_TABLET | Freq: Three times a day (TID) | ORAL | Status: DC
  Administered 2016-09-15: 15 mg via ORAL
  Filled 2016-09-15 (×2): qty 1

## 2016-09-15 MED ORDER — FUROSEMIDE 20 MG PO TABS
20.0000 mg | ORAL_TABLET | Freq: Every day | ORAL | Status: DC
Start: 2016-09-15 — End: 2016-09-15
  Administered 2016-09-15: 20 mg via ORAL
  Filled 2016-09-15: qty 1

## 2016-09-15 MED ORDER — MEXILETINE HCL 200 MG PO CAPS: 200.0000 mg | ORAL_CAPSULE | Freq: Three times a day (TID) | ORAL | 3 refills | Status: DC

## 2016-09-15 MED ORDER — AMIODARONE HCL 200 MG PO TABS: ORAL_TABLET | ORAL | 3 refills | Status: DC

## 2016-09-15 MED ORDER — RIVAROXABAN 20 MG PO TABS
20.0000 mg | ORAL_TABLET | Freq: Every day | ORAL | Status: DC
  Filled 2016-09-15: qty 1

## 2016-09-15 MED ORDER — BENAZEPRIL HCL 10 MG PO TABS
10.0000 mg | ORAL_TABLET | Freq: Every day | ORAL | Status: DC
  Administered 2016-09-15: 10 mg via ORAL
  Filled 2016-09-15: qty 1

## 2016-09-15 MED ORDER — TAMSULOSIN HCL 0.4 MG PO CAPS: 0.4000 mg | ORAL_CAPSULE | Freq: Every day | ORAL | Status: DC

## 2016-09-15 MED ORDER — MOMETASONE FURO-FORMOTEROL FUM 200-5 MCG/ACT IN AERO
2.0000 | INHALATION_SPRAY | Freq: Two times a day (BID) | RESPIRATORY_TRACT | Status: DC
  Administered 2016-09-15: 2 via RESPIRATORY_TRACT
  Filled 2016-09-15: qty 8.8

## 2016-09-15 MED ORDER — FENOFIBRATE 160 MG PO TABS
160.0000 mg | ORAL_TABLET | Freq: Every day | ORAL | Status: DC
  Administered 2016-09-15: 160 mg via ORAL
  Filled 2016-09-15: qty 1

## 2016-09-15 MED ORDER — PERFLUTREN LIPID MICROSPHERE
1.0000 mL | INTRAVENOUS | Status: AC | PRN
  Administered 2016-09-15: 2 mL via INTRAVENOUS
  Filled 2016-09-15 (×2): qty 10

## 2016-09-15 MED ORDER — AMIODARONE HCL 200 MG PO TABS
200.0000 mg | ORAL_TABLET | Freq: Every day | ORAL | Status: DC
Start: 2016-09-15 — End: 2016-09-15
  Administered 2016-09-15: 200 mg via ORAL
  Filled 2016-09-15: qty 1

## 2016-09-15 MED ORDER — FLUTICASONE PROPIONATE 50 MCG/ACT NA SUSP
2.0000 | Freq: Every day | NASAL | Status: DC
  Filled 2016-09-15: qty 16

## 2016-09-15 MED ORDER — MEXILETINE HCL 200 MG PO CAPS
200.0000 mg | ORAL_CAPSULE | Freq: Two times a day (BID) | ORAL | Status: DC
  Administered 2016-09-15: 200 mg via ORAL
  Filled 2016-09-15 (×2): qty 1

## 2016-09-15 MED ORDER — POTASSIUM CHLORIDE CRYS ER 20 MEQ PO TBCR
40.0000 meq | EXTENDED_RELEASE_TABLET | Freq: Once | ORAL | Status: AC
  Administered 2016-09-15: 40 meq via ORAL
  Filled 2016-09-15: qty 2

## 2016-09-15 MED ORDER — BUPROPION HCL 75 MG PO TABS
75.0000 mg | ORAL_TABLET | Freq: Every day | ORAL | Status: DC
Start: 2016-09-15 — End: 2016-09-15
  Administered 2016-09-15: 75 mg via ORAL
  Filled 2016-09-15: qty 1

## 2016-09-15 MED ORDER — ESCITALOPRAM OXALATE 20 MG PO TABS
20.0000 mg | ORAL_TABLET | Freq: Every day | ORAL | Status: DC
  Administered 2016-09-15: 20 mg via ORAL
  Filled 2016-09-15: qty 1

## 2016-09-15 MED ORDER — ATORVASTATIN CALCIUM 80 MG PO TABS
80.0000 mg | ORAL_TABLET | Freq: Every day | ORAL | Status: DC
  Administered 2016-09-15: 80 mg via ORAL
  Filled 2016-09-15: qty 1

## 2016-09-15 MED ORDER — TIOTROPIUM BROMIDE MONOHYDRATE 18 MCG IN CAPS
18.0000 ug | ORAL_CAPSULE | Freq: Every day | RESPIRATORY_TRACT | Status: DC
  Administered 2016-09-15: 18 ug via RESPIRATORY_TRACT
  Filled 2016-09-15: qty 5

## 2016-09-15 MED ORDER — PANTOPRAZOLE SODIUM 40 MG PO TBEC
40.0000 mg | DELAYED_RELEASE_TABLET | Freq: Every day | ORAL | Status: DC
  Administered 2016-09-15: 40 mg via ORAL
  Filled 2016-09-15: qty 1

## 2016-09-15 MED ORDER — MAGNESIUM SULFATE 2 GM/50ML IV SOLN
2.0000 g | Freq: Once | INTRAVENOUS | Status: AC
  Administered 2016-09-15: 2 g via INTRAVENOUS
  Filled 2016-09-15: qty 50

## 2016-09-15 NOTE — Care Management Note (Signed)
Case Management Note  Patient Details  Name: Steven Guzman MRN: [id number] Date of Birth: 07-14-1940  Subjective/Objective:   From home with spouse, presents with AICD firing,,already on xarelto.   PCP   Pricilla Holm               Action/Plan: NCM will follow for dc needs.  Expected Discharge Date:  09/15/16               Expected Discharge Plan:  Home/Self Care  In-House Referral:     Discharge planning Services  CM Consult  Post Acute Care Choice:    Choice offered to:     DME Arranged:    DME Agency:     HH Arranged:    HH Agency:     Status of Service:  Completed, signed off  If discussed at [employer] of Stay Meetings, dates discussed:    Additional Comments:  Zenon Mayo, RN 09/15/2016, 1:21 PM

## 2016-09-15 NOTE — Discharge Summary (Signed)
ELECTROPHYSIOLOGY PROCEDURE DISCHARGE SUMMARY    Patient ID: Steven Guzman,  MRN: [id number], DOB/AGE: February 18, 1941 76 y.o.  Admit date: 09/14/2016 Discharge date: 09/15/2016  Primary Care Physician: Hoyt Koch, MD Primary Cardiologist: Tamala Julian Electrophysiologist: Lovena Le  Primary Discharge Diagnosis:  1.  Ventricular tachycardia  Secondary Discharge Diagnoses: 1.  CAD 2.  Prior cardiac arrest 3.  Chronic systolic heart failure 4.  COPD 5.  Ongoing tobacco abuse 6.  HTN  Allergies  Allergen Reactions  . Sulfa Antibiotics Hives    Brief HPI/Hospital Course:  Steven Guzman is a 76 y.o. male with a past medical history is significant for CAD, chronic systolic heart failure, COPD, ongoing tobacco abuse, hypertension, and recurrent VT on Mexiletine and amiodarone s/p ablation 2016. He did well initially post ablation but has had recurrent slow VT since. He was seen in the office 06/2016 and was in slow VT that was successfully pace terminated. He has had brief episodes since that last up to 30 minutes at a time but then self terminate. He went into VT last night around 6PM and it persisted for several hours. He had associated shortness of breath with minimal exertion. He called the on call fellow and was advised to come to the ER. On arrival, he was in slow VT. Because he was hemodynamically stable, he was admitted for observation with planned EP eval today.  He reports compliance with Mexiletine and amiodarone at home. He has not been able to identify triggers for VT. He has switched to decaf coffee.  He currently denies chest pain, shortness of breath at rest, LE edema, recent fevers, chills, nausea or vomiting. He has not had frank syncope.  His device was interrogated and his VT was successfully pace terminated.  He was seen by Dr Lovena Le and considered stable for discharge to home with close outpatient follow up. Amiodarone and Mexiletine will be increased at  discharge.   Physical Exam: Vitals:   09/15/16 0600 09/15/16 0700 09/15/16 0855 09/15/16 1230  BP: 100/82 111/82 108/82 124/68  Pulse: 95 92 64 (!) 51  Resp: 17 (!) 24 (!) 21 17  Temp:   98.4 F (36.9 C) 98.4 F (36.9 C)  TempSrc:   Oral Oral  SpO2: 96% 96% 94% 95%  Weight:      Height:        Labs:   Lab Results  Component Value Date   WBC 9.0 09/14/2016   HGB 13.2 09/14/2016   HCT 41.5 09/14/2016   MCV 90.0 09/14/2016   PLT 249 09/14/2016     Recent Labs Lab 09/14/16 2302  NA 135  K 3.7  CL 102  CO2 24  BUN 9  CREATININE 1.33*  CALCIUM 9.3  GLUCOSE 106*     Discharge Medications:  Current Discharge Medication List    CONTINUE these medications which have CHANGED   Details  amiodarone (PACERONE) 200 MG tablet Take 1 tablet twice daily for 2 weeks then 1 tablet daily. Qty: 90 tablet, Refills: 3   Associated Diagnoses: Ventricular tachycardia (HCC)    mexiletine (MEXITIL) 200 MG capsule Take 1 capsule (200 mg total) by mouth 3 (three) times daily. Qty: 270 capsule, Refills: 3      CONTINUE these medications which have NOT CHANGED   Details  albuterol (PROVENTIL) (2.5 MG/3ML) 0.083% nebulizer solution Take 3 mLs (2.5 mg total) by nebulization every 6 (six) hours as needed for wheezing or shortness of breath. Qty: 360 mL, Refills: 12  atorvastatin (LIPITOR) 80 MG tablet take 1 tablet by mouth once daily Qty: 30 tablet, Refills: 9    benazepril (LOTENSIN) 10 MG tablet take 1 tablet by mouth once daily Qty: 90 tablet, Refills: 2    buPROPion (WELLBUTRIN) 75 MG tablet TAKE 1 TABLET BY MOUTH ONCE DAILY Qty: 90 tablet, Refills: 0    busPIRone (BUSPAR) 15 MG tablet take 1 tablet by mouth three times a day Qty: 90 tablet, Refills: 5    carvedilol (COREG) 6.25 MG tablet Take 1 tablet (6.25 mg total) by mouth 2 (two) times daily. Qty: 180 tablet, Refills: 3   Associated Diagnoses: Coronary artery disease involving coronary bypass graft of native heart  without angina pectoris; Chronic systolic heart failure (HCC)    cetirizine (ZYRTEC) 10 MG tablet Take 10 mg by mouth daily as needed for allergies.    Choline Fenofibrate (FENOFIBRIC ACID) 135 MG CPDR take 1 capsule by mouth once daily Qty: 30 capsule, Refills: 9    escitalopram (LEXAPRO) 20 MG tablet TAKE 1 TABLET BY MOUTH ONCE DAILY Qty: 30 tablet, Refills: 0    fluticasone (FLONASE) 50 MCG/ACT nasal spray Place 2 sprays into both nostrils daily.    furosemide (LASIX) 40 MG tablet take 1/2 tablet by mouth once daily Qty: 45 tablet, Refills: 3    gabapentin (NEURONTIN) 100 MG capsule TAKE 3 CAPSULES BY MOUTH TWICE A DAY Qty: 540 capsule, Refills: 0    Ipratropium-Albuterol (COMBIVENT RESPIMAT) 20-100 MCG/ACT AERS respimat Inhale 1 puff into the lungs every 6 (six) hours. Shortness of breath or wheezing    nitroGLYCERIN (NITROSTAT) 0.4 MG SL tablet Place 1 tablet (0.4 mg total) under the tongue every 5 (five) minutes x 3 doses as needed for chest pain. Qty: 25 tablet, Refills: 11    omeprazole (PRILOSEC) 20 MG capsule Take 20 mg by mouth 2 (two) times daily.    SPIRIVA HANDIHALER 18 MCG inhalation capsule inhale the contents of one capsule in the handihaler once daily Qty: 30 capsule, Refills: 5    SYMBICORT 160-4.5 MCG/ACT inhaler inhale 2 puffs by mouth every morning then 2 puffs every evening SEPARTE DOSES BY 12 HOURS Qty: 10.2 g, Refills: 5    tamsulosin (FLOMAX) 0.4 MG CAPS capsule TAKE 1 CAPSULE BY MOUTH ONCE DAILY AFTER SUPPER Qty: 30 capsule, Refills: 0    XARELTO 20 MG TABS tablet take 1 tablet by mouth once daily with SUPPER Qty: 30 tablet, Refills: 6   Associated Diagnoses: Paroxysmal atrial fibrillation (HCC)        Disposition: Pt is being discharged home today in good condition. Discharge Instructions    Diet - low sodium heart healthy    Complete by:  As directed    Increase activity slowly    Complete by:  As directed      Follow-up Information     [name], MD Follow up on 09/24/2016.   Specialty:  Cardiology Why:  at 12:30PM  Contact information: [address] [address] [phone]           Duration of Discharge Encounter: Greater than 30 minutes including physician time.  Signed, Chanetta Marshall, NP 09/15/2016 1:16 PM  EP Attending  Patient seen and examined. Agree with above. The patient appears to be stable for DC. He will followup as above. He has had his dose of amio increased and Mexitil.   Mikle Bosworth.D

## 2016-09-15 NOTE — ED Notes (Signed)
Dr.Roby, Cards, made aware of blood pressure. Map >65; pt A&Ox4

## 2016-09-15 NOTE — Care Management CC44 (Signed)
Condition Code 44 Documentation Completed  Patient Details  Name: Steven Guzman MRN: [id number] Date of Birth: 06/28/1940   Condition Code 44 given:  Yes Patient signature on Condition Code 44 notice:  Yes Documentation of 2 MD's agreement:  Yes Code 44 added to claim:  Yes    Carles Collet, RN 09/15/2016, 2:17 PM

## 2016-09-15 NOTE — Care Management Obs Status (Signed)
[facility] NOTIFICATION   Patient Details  Name: Steven Guzman MRN: [id number] Date of Birth: [birth year]   Medicare Observation Status Notification Given:  Yes    Carles Collet, RN 09/15/2016, 2:17 PM

## 2016-09-15 NOTE — H&P (Signed)
CARDIOLOGY H&P  HPI: Pt of Dr. Lovena Le who is a 76 yo male with a pmhx of CAD s/p PCI, chronic systolic heart failure NYHA class II, s/p BS ICD, and VT s/p ablation, now on amiodarone and mexiletine who presented to the ED with complaints of "I'm in VT."  Pt states that last night, around 1800 he noticed that he developed some SOB, uneasiness, and the feeling that he was back in slow VT.  Pt checked his pulse, and noted it to be in the 90s, which is significantly higher than his baseline 60s.  He also experienced some dizziness, but only when he rapidly changed positions.  He took his evening dose of Mexiletine, however this did not improve his symptoms.  he then called the emergency line and I advised him to go to the ED for further evaluation.    In the ED, a STEMI alert was initially called due inferior STE when compared to his EKG in SR.  This STEMI alert was however canceled after comparisons to prior EKGs while he was in VT showed no significant change.  Cardiology asked to evaluate for admission.  Pt denies CP, LE edema, orthopnea, syncope, or medication noncompliance.  He has not experienced any recent AICD discharges.     Review of Systems:     Cardiac Review of Systems: {Y] = yes [ ]  = no  Chest Pain [    ]  Resting SOB [ Y  ] Exertional SOB  [  ]  Orthopnea [  ]   Pedal Edema [   ]    Palpitations [  ] Syncope  [  ]   Presyncope [   ]  General Review of Systems: [Y] = yes [  ]=no Constitional: recent weight change [  ]; anorexia [  ]; fatigue [  ]; nausea [  ]; night sweats [  ]; fever [  ]; or chills [  ];                                                                     Dental: poor dentition[  ];   Eye : blurred vision [  ]; diplopia [   ]; vision changes [  ];  Amaurosis fugax[  ]; Resp: cough [  ];  wheezing[  ];  hemoptysis[  ]; shortness of breath[  ]; paroxysmal nocturnal dyspnea[  ]; dyspnea on exertion[  ]; or orthopnea[  ];  GI:  gallstones[  ], vomiting[  ];  dysphagia[   ]; melena[  ];  hematochezia [  ]; heartburn[  ];   GU: kidney stones [  ]; hematuria[  ];   dysuria [  ];  nocturia[  ];               Skin: rash [  ], swelling[  ];, hair loss[  ];  peripheral edema[  ];  or itching[  ]; Musculosketetal: myalgias[  ];  joint swelling[  ];  joint erythema[  ];  joint pain[  ];  back pain[  ];  Heme/Lymph: bruising[  ];  bleeding[  ];  anemia[  ];  Neuro: TIA[  ];  headaches[  ];  stroke[  ];  vertigo[  ];  seizures[  ];   paresthesias[  ];  difficulty walking[  ];  Psych:depression[  ]; Steven Guzman ];  Endocrine: diabetes[  ];  thyroid dysfunction[  ];  Other:  Past Medical History:  Diagnosis Date  . AICD (automatic cardioverter/defibrillator) present   . Aneurysm ([place])    abdominal <4. 3.6 cm on ultrasound 09/2014.   . Anginal pain ([place])   . Anxiety   . Basal cell carcinoma of nose    S/P MOHS  . Biliary acute pancreatitis   . CAD (coronary artery disease)    a. s/p MI in 1994 with PCI to LAD at that time b. cath 10/2012 demonstrated EF 30%, inferior akinesis with mild hypokinesis of all walls, patent LAD and RCA stents; ostial PDA with 80-90% obstruction with medical therapy recommended   . Chronic bronchitis ([place])    "was having it q yr; haven't had it in a couple years" (12/21/2014)  . Chronic systolic CHF (congestive heart failure) (HCC)    EF 30 to 35 % as of 09/2014.   [name] Complication of anesthesia 10/2014   "had to have defibrillator w/ERCP"  . COPD (chronic obstructive pulmonary disease) ([place])    a. followed by pulmonary, COPD GOLD stage II  . Depression   . Diverticulosis of colon 07/2014   noted on CT  . GERD (gastroesophageal reflux disease)   . Hiatal hernia   . Hyperglycemia 10/2012.  [name] Hyperlipidemia   . Hypertension   . Myocardial infarction [facility]) 1994; 2011  . Pneumonia 1946; 2015  . Prostate enlargement 07/2014   observed on CT  . Tobacco abuse   . Ventricular tachycardia ([place])    a. 08/2009 s/p BSX E110 Teligen 100 AICD, ser#:  [id number];  b. 08/2008 VT req ATP - detection reprogrammed from 160 to 150. c. EPS and VT ablation by Dr. Lovena Le 12/21/2014    Allergies  Allergen Reactions  . Sulfa Antibiotics Hives    Social History   Social History  . Marital status: Married    Spouse name: N/A  . Number of children: N/A  . Years of education: N/A   Occupational History  . Retired    Social History Main Topics  . Smoking status: Current Every Day Smoker    Packs/day: 1.00    Years: 55.00    Types: Cigarettes  . Smokeless tobacco: Never Used     Comment: had quit, recently bought a pack  . Alcohol use 0.0 oz/week     Comment: 12/21/2014 "might have a couple glasses of wine/year"  . Drug use: No  . Sexual activity: Not Currently   Other Topics Concern  . Not on file   Social History Narrative  . No narrative on file    Family History  Problem Relation Age of Onset  . Heart attack Brother   . CAD Father   . Hypertension Father   . CAD Mother   . Hypertension Mother   . Hypertension Brother   . Stroke Neg Hx     PHYSICAL EXAM: Vitals:   09/15/16 0300 09/15/16 0345  BP: 97/81 105/67  Pulse: 92 96  Resp: 20   Temp:  98.4 F (36.9 C)   General:  Well appearing. No respiratory difficulty HEENT: normal Neck: supple. no JVD. Carotids 2+ bilat; no bruits. No lymphadenopathy or thryomegaly appreciated. Cor: PMI nondisplaced. Regular rate & rhythm. No rubs, gallops or murmurs. Lungs: clear Abdomen: soft, nontender, nondistended. No hepatosplenomegaly. No bruits or masses. Good bowel sounds.  Extremities: no cyanosis, clubbing, rash, edema Neuro: alert & oriented x 3, cranial nerves grossly intact. moves all 4 extremities w/o difficulty. Affect pleasant.  ECG:  Results for orders placed or performed during the hospital encounter of 09/14/16 (from the past 24 hour(s))  Basic metabolic panel     Status: Abnormal   Collection Time: 09/14/16 11:02 PM  Result Value Ref Range   Sodium 135 135 - 145  mmol/L   Potassium 3.7 3.5 - 5.1 mmol/L   Chloride 102 101 - 111 mmol/L   CO2 24 22 - 32 mmol/L   Glucose, Bld 106 (H) 65 - 99 mg/dL   BUN 9 6 - 20 mg/dL   Creatinine, Ser 1.33 (H) 0.61 - 1.24 mg/dL   Calcium 9.3 8.9 - 10.3 mg/dL   GFR calc non Af Amer 50 (L) >60 mL/min   GFR calc Af Amer 58 (L) >60 mL/min   Anion gap 9 5 - 15  CBC     Status: Abnormal   Collection Time: 09/14/16 11:02 PM  Result Value Ref Range   WBC 9.0 4.0 - 10.5 K/uL   RBC 4.61 4.22 - 5.81 MIL/uL   Hemoglobin 13.2 13.0 - 17.0 g/dL   HCT 41.5 39.0 - 52.0 %   MCV 90.0 78.0 - 100.0 fL   MCH 28.6 26.0 - 34.0 pg   MCHC 31.8 30.0 - 36.0 g/dL   RDW 15.8 (H) 11.5 - 15.5 %   Platelets 249 150 - 400 K/uL  Protime-INR (order if Patient is taking Coumadin / Warfarin)     Status: Abnormal   Collection Time: 09/14/16 11:02 PM  Result Value Ref Range   Prothrombin Time 27.2 (H) 11.4 - 15.2 seconds   INR 2.47   I-stat troponin, ED     Status: None   Collection Time: 09/14/16 11:16 PM  Result Value Ref Range   Troponin i, poc 0.01 0.00 - 0.08 ng/mL   Comment 3           Dg Chest 2 View  Result Date: 09/14/2016 CLINICAL DATA:  Shortness of breath. Sudden onset of shortness breath earlier tonight. EXAM: CHEST  2 VIEW COMPARISON:  05/07/2016 FINDINGS: Left-sided pacemaker remains in place. Stable heart size and mediastinal contours with retrocardiac hiatal hernia. The lungs are hyperinflated. No pulmonary edema, consolidation, pleural fluid or pneumothorax. No acute osseous abnormality. IMPRESSION: Chronic hyperinflation, can be seen with COPD. No acute abnormality. Electronically Signed   By: Jeb Levering M.D.   On: 09/14/2016 23:56   EKG: likely VT with retrograde conduction of P waves, inferior Q waves and inferior STE, similar to EKG on 07/29/15  ASSESSMENT: 76 yo male with known "slow VT" below the detection limit on his ACID who presents with recurrent, hemodynamically stable VT.  We gave him 150mg  of amiodarone in  the ED, however this did not convert him back to SR.  At this point, feel that we can monitor overnight and allow electrophysiology evaluation in AM.  Pts options are somewhat limited as he is already on Amiodarone and mexiletine at home.    PLAN/DISCUSSION: 1. VT -Admit to the SDU with telemetry monitoring -Trend troponin -Continue home mexiletine and amiodarone for now.  -EP eval in AM0 -Keep pads in place, if he becomes HD unstable or significantly symptomatic, will need to consider cardioversion.  -monitor K and Mg, goal 4-5 and >2 respectively  -Unable to uptitrate BB due to hypotension -Device interrogation in the ED, awaiting final report.  2. Chronic systolic heart failure -Appears euvolemic, continue home meds  3. COPD -Continue home inhalers   4. BPH -Continue flomax  Regino Bellow, DO 4:03 AM

## 2016-09-15 NOTE — Progress Notes (Signed)
  Echocardiogram 2D Echocardiogram has been performed.  Steven Guzman 09/15/2016, 11:55 AM

## 2016-09-15 NOTE — Progress Notes (Signed)
All discharge instructions reviewed in detail with understanding verbalized.  Denies any pain or discomfort.  VSS.  Denies any questions or concerns prior to discharge.  Discharged to home in stable condition with family.

## 2016-09-15 NOTE — Plan of Care (Signed)
Problem: Education: Goal: Knowledge of [facility] Education information/materials will improve Outcome: Progressing Discussed plan of care and infection prevention with the patient with some teach back displayed  Comments: Topics: admission, MRSA screening, mouth care, diet, discharge and signs and symptoms of arrhythmias.

## 2016-09-15 NOTE — Consult Note (Signed)
ELECTROPHYSIOLOGY CONSULT NOTE    Patient ID: Steven Guzman MRN: [id number], DOB/AGE: 78-Feb-1942 78 y.o.  Admit date: 09/14/2016 Date of Consult: 09/15/2016  Primary Physician: Hoyt Koch, MD Primary Cardiologist: Tamala Julian Electrophysiologist: Lovena Le  Reason for Consultation: VT  HPI:  Steven Guzman is a 76 y.o. male is referred by Dr Overton Mam for evaluation of VT.  Past medical history is significant for CAD, chronic systolic heart failure, COPD, ongoing tobacco abuse, hypertension, and recurrent VT on Mexiletine and amiodarone s/p ablation 2016. He did well initially post ablation but has had recurrent slow VT since. He was seen in the office 06/2016 and was in slow VT that was successfully pace terminated. He has had brief episodes since that last up to 30 minutes at a time but then self terminate. He went into VT last night around 6PM and it persisted for several hours. He had associated shortness of breath with minimal exertion. He called the on call fellow and was advised to come to the ER. On arrival, he was in slow VT. Because he was hemodynamically stable, he was admitted for observation with planned EP eval today.  He reports compliance with Mexiletine and amiodarone at home. He has not been able to identify triggers for VT. He has switched to decaf coffee.  He currently denies chest pain, shortness of breath at rest, LE edema, recent fevers, chills, nausea or vomiting. He has not had frank syncope.  Last echo 2016 demonstrated EF 35%, septal, apical, and inferior wall hypokinesis. Ablation in 2016 was done with extensive scar modification on LV septum.   Past Medical History:  Diagnosis Date  . AICD (automatic cardioverter/defibrillator) present   . Aneurysm ([place])    abdominal <4. 3.6 cm on ultrasound 09/2014.   . Anginal pain ([place])   . Anxiety   . Basal cell carcinoma of nose    S/P MOHS  . Biliary acute pancreatitis   . CAD (coronary artery disease)    a. s/p MI in  1994 with PCI to LAD at that time b. cath 10/2012 demonstrated EF 30%, inferior akinesis with mild hypokinesis of all walls, patent LAD and RCA stents; ostial PDA with 80-90% obstruction with medical therapy recommended   . Chronic bronchitis ([place])    "was having it q yr; haven't had it in a couple years" (12/21/2014)  . Chronic systolic CHF (congestive heart failure) (HCC)    EF 30 to 35 % as of 09/2014.   [name] Complication of anesthesia 10/2014   "had to have defibrillator w/ERCP"  . COPD (chronic obstructive pulmonary disease) ([place])    a. followed by pulmonary, COPD GOLD stage II  . Depression   . Diverticulosis of colon 07/2014   noted on CT  . GERD (gastroesophageal reflux disease)   . Hiatal hernia   . Hyperglycemia 10/2012.  [name] Hyperlipidemia   . Hypertension   . Myocardial infarction [facility]) 1994; 2011  . Pneumonia 1946; 2015  . Prostate enlargement 07/2014   observed on CT  . Tobacco abuse   . Ventricular tachycardia ([place])    a. 08/2009 s/p BSX E110 Teligen 100 AICD, ser#: [id number];  b. 08/2008 VT req ATP - detection reprogrammed from 160 to 150. c. EPS and VT ablation by Dr. Lovena Le 12/21/2014     Surgical History:  Past Surgical History:  Procedure Laterality Date  . CATARACT EXTRACTION W/ INTRAOCULAR LENS  IMPLANT, BILATERAL Bilateral ~ 2011  . COLONOSCOPY    . ELECTROPHYSIOLOGIC STUDY N/A  12/21/2014   Procedure: Stephanie Coup Ablation;  Surgeon: [name], MD;  Location: [facility];  Service: Cardiovascular;  Laterality: N/A;  . ERCP N/A 11/16/2014   Procedure: ENDOSCOPIC RETROGRADE CHOLANGIOPANCREATOGRAPHY (ERCP);  Surgeon: [name], MD;  Location: [facility];  Service: Endoscopy;  Laterality: N/A;  . EYE SURGERY    . FOOT SURGERY Left 2005   "fixed bone that stuck out in my ankle area"  . HEMORRHOID BANDING    . IMPLANTABLE CARDIOVERTER DEFIBRILLATOR IMPLANT  09/06/09   BSX dual chamber ICD implanted in [place] for cardiac arrest 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  . INGUINAL  HERNIA REPAIR Right ~ 1995  . LEFT HEART CATHETERIZATION WITH CORONARY ANGIOGRAM N/A 11/25/2012   demonstrated EF 30%, inferior akinesis with mild hypokinesis of all walls, patent LAD and RCA stents; ostial PDA with 80-90% obstruction with medical therapy recommended  . MOHS SURGERY  2008   nose, skin graft  . RETINAL DETACHMENT SURGERY Right 2013  . TENOLYSIS Right 12/21/2013   Procedure: TENOLYSIS FLEXOR CARPI RADIALIS ,DEBRIDEMENT RIGHT JOINT WRIST,DEBRIDEMENT SCAPHOTRAPEZIAL TRAPEZOID, REPAIR OF EXTENSOR HOOD;  Surgeon: Daryll Brod, MD;  Location: [facility];  Service: Orthopedics;  Laterality: Right;  . V-TACH ABLATION  12/21/2014  . VIDEO BRONCHOSCOPY Bilateral 01/09/2016   Procedure: VIDEO BRONCHOSCOPY WITHOUT FLUORO;  Surgeon: Juanito Doom, MD;  Location: [facility];  Service: Cardiopulmonary;  Laterality: Bilateral;     Prescriptions Prior to Admission  Medication Sig Dispense Refill Last Dose  . albuterol (PROVENTIL) (2.5 MG/3ML) 0.083% nebulizer solution Take 3 mLs (2.5 mg total) by nebulization every 6 (six) hours as needed for wheezing or shortness of breath. 360 mL 12 Past Week at Unknown time  . amiodarone (PACERONE) 200 MG tablet Take 1 tablet (200 mg) by mouth daily Monday through Friday and 1/2 tablet (100 mg) on Saturday and Sunday (Patient taking differently: Take 100-200 mg by mouth See admin instructions. Take 1 tablet (200 mg) by mouth daily Monday through Friday and 1/2 tablet (100 mg) on Saturday and Sunday) 90 tablet 3 09/14/2016 at Unknown time  . atorvastatin (LIPITOR) 80 MG tablet take 1 tablet by mouth once daily 30 tablet 9 09/13/2016 at Unknown time  . benazepril (LOTENSIN) 10 MG tablet take 1 tablet by mouth once daily 90 tablet 2 09/14/2016 at Unknown time  . buPROPion (WELLBUTRIN) 75 MG tablet TAKE 1 TABLET BY MOUTH ONCE DAILY 90 tablet 0 09/14/2016 at Unknown time  . busPIRone (BUSPAR) 15 MG tablet take 1 tablet by mouth three times a day 90  tablet 5 09/14/2016 at Unknown time  . carvedilol (COREG) 6.25 MG tablet Take 1 tablet (6.25 mg total) by mouth 2 (two) times daily. (Patient taking differently: Take 3.125 mg by mouth 2 (two) times daily with a meal. ) 180 tablet 3 09/14/2016 at 0800  . cetirizine (ZYRTEC) 10 MG tablet Take 10 mg by mouth daily as needed for allergies.   Past Week at Unknown time  . Choline Fenofibrate (FENOFIBRIC ACID) 135 MG CPDR take 1 capsule by mouth once daily 30 capsule 9 09/14/2016 at Unknown time  . escitalopram (LEXAPRO) 20 MG tablet TAKE 1 TABLET BY MOUTH ONCE DAILY 30 tablet 0 09/14/2016 at Unknown time  . fluticasone (FLONASE) 50 MCG/ACT nasal spray Place 2 sprays into both nostrils daily.   09/14/2016 at Unknown time  . furosemide (LASIX) 40 MG tablet take 1/2 tablet by mouth once daily 45 tablet 3 09/14/2016 at Unknown time  .  gabapentin (NEURONTIN) 100 MG capsule TAKE 3 CAPSULES BY MOUTH TWICE A DAY 540 capsule 0 09/14/2016 at Unknown time  . Ipratropium-Albuterol (COMBIVENT RESPIMAT) 20-100 MCG/ACT AERS respimat Inhale 1 puff into the lungs every 6 (six) hours. Shortness of breath or wheezing   09/14/2016 at Unknown time  . mexiletine (MEXITIL) 200 MG capsule Take 1 capsule (200 mg total) by mouth 2 (two) times daily. 180 capsule 3 09/14/2016 at Unknown time  . nitroGLYCERIN (NITROSTAT) 0.4 MG SL tablet Place 1 tablet (0.4 mg total) under the tongue every 5 (five) minutes x 3 doses as needed for chest pain. 25 tablet 11 09/14/2016 at Unknown time  . omeprazole (PRILOSEC) 20 MG capsule Take 20 mg by mouth 2 (two) times daily.   09/14/2016 at Unknown time  . SPIRIVA HANDIHALER 18 MCG inhalation capsule inhale the contents of one capsule in the handihaler once daily 30 capsule 5 09/14/2016 at Unknown time  . SYMBICORT 160-4.5 MCG/ACT inhaler inhale 2 puffs by mouth every morning then 2 puffs every evening SEPARTE DOSES BY 12 HOURS 10.2 g 5 09/14/2016 at Unknown time  . tamsulosin (FLOMAX) 0.4 MG CAPS capsule TAKE 1  CAPSULE BY MOUTH ONCE DAILY AFTER SUPPER 30 capsule 0 09/13/2016 at Unknown time  . XARELTO 20 MG TABS tablet take 1 tablet by mouth once daily with SUPPER 30 tablet 6 09/14/2016 at 2000    Inpatient Medications:  . amiodarone  200 mg Oral Daily  . atorvastatin  80 mg Oral Daily  . benazepril  10 mg Oral Daily  . buPROPion  75 mg Oral Daily  . busPIRone  15 mg Oral TID  . carvedilol  6.25 mg Oral BID WC  . escitalopram  20 mg Oral Daily  . fenofibrate  160 mg Oral Daily  . fluticasone  2 spray Each Nare Daily  . furosemide  20 mg Oral Daily  . gabapentin  300 mg Oral BID  . loratadine  10 mg Oral Daily  . mexiletine  200 mg Oral BID  . mometasone-formoterol  2 puff Inhalation BID  . pantoprazole  40 mg Oral Daily  . rivaroxaban  20 mg Oral Q supper  . tamsulosin  0.4 mg Oral QPC supper  . tiotropium  18 mcg Inhalation Daily    Allergies:  Allergies  Allergen Reactions  . Sulfa Antibiotics Hives    Social History   Social History  . Marital status: Married    Spouse name: N/A  . Number of children: N/A  . Years of education: N/A   Occupational History  . Retired    Social History Main Topics  . Smoking status: Current Every Day Smoker    Packs/day: 1.00    Years: 55.00    Types: Cigarettes  . Smokeless tobacco: Never Used     Comment: had quit, recently bought a pack  . Alcohol use 0.0 oz/week     Comment: 12/21/2014 "might have a couple glasses of wine/year"  . Drug use: No  . Sexual activity: Not Currently   Other Topics Concern  . Not on file   Social History Narrative  . No narrative on file     Family History  Problem Relation Age of Onset  . Heart attack Brother   . CAD Father   . Hypertension Father   . CAD Mother   . Hypertension Mother   . Hypertension Brother   . Stroke Neg Hx      Review of Systems: All other systems  reviewed and are otherwise negative except as noted above.  Physical Exam: Vitals:   09/15/16 0400 09/15/16 0500  09/15/16 0600 09/15/16 0700  BP: (!) 112/99 100/86 100/82 111/82  Pulse: 92 95 95 92  Resp: (!) 22 19 17  (!) 24  Temp:      TempSrc:      SpO2: 96% 95% 96% 96%  Weight:      Height:        GEN- The patient is elderly appearing, alert and oriented x 3 today.   HEENT: normocephalic, atraumatic; sclera clear, conjunctiva pink; hearing intact; oropharynx clear; neck supple  Lungs- Clear to ausculation bilaterally, normal work of breathing.  No wheezes, rales, rhonchi Heart- Regular rate and rhythm  GI- soft, non-tender, non-distended, bowel sounds present  Extremities- no clubbing, cyanosis, or edema  MS- no significant deformity or atrophy Skin- warm and dry, no rash or lesion Psych- euthymic mood, full affect Neuro- strength and sensation are intact  Labs:   Lab Results  Component Value Date   WBC 9.0 09/14/2016   HGB 13.2 09/14/2016   HCT 41.5 09/14/2016   MCV 90.0 09/14/2016   PLT 249 09/14/2016     Recent Labs Lab 09/14/16 2302  NA 135  K 3.7  CL 102  CO2 24  BUN 9  CREATININE 1.33*  CALCIUM 9.3  GLUCOSE 106*      Radiology/Studies: Dg Chest 2 View  Result Date: 09/14/2016 CLINICAL DATA:  Shortness of breath. Sudden onset of shortness breath earlier tonight. EXAM: CHEST  2 VIEW COMPARISON:  05/07/2016 FINDINGS: Left-sided pacemaker remains in place. Stable heart size and mediastinal contours with retrocardiac hiatal hernia. The lungs are hyperinflated. No pulmonary edema, consolidation, pleural fluid or pneumothorax. No acute osseous abnormality. IMPRESSION: Chronic hyperinflation, can be seen with COPD. No acute abnormality. Electronically Signed   By: Jeb Levering M.D.   On: 09/14/2016 23:56    EKG:VT, rate 98 with 1:1 VA conduction  (personally reviewed)  TELEMETRY: VT, rate 98 (personally reviewed)  DEVICE HISTORY: BSX dual chamber ICD implanted 2011 in [place] at time of cardiac arrest 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  Assessment/Plan: 1.  Sustained  hemodynamically stable VT He is symptomatic with increased shortness of breath He has had persistent episodes since seen by Dr Lovena Le last in the office, but these usually self terminate after 30 minutes He has been in VT since around 6PM last night. Will attempt to pace terminate this morning Increase amiodarone to 200mg  twice daily for 2 weeks then 1 tablet daily at discharge.   Update echo  No driving x6 months   2.  CAD No recent ischemic symptoms Continue current therapy  3. Chronic systolic heart failure Worsened by VT Continue current therapy  4.  HTN Stable No change required today  5.  Tobacco abuse Cessation again advised   Dr Lovena Le to see later this morning  Signed, Chanetta Marshall, NP 09/15/2016 7:56 AM  EP Attending  Patient seen and examined. He is familiar to me from prior clinic visits and prior VT ablation. He has had recurrent VT and was pace terminated this morning under my direction back to NSR. He has not had any changes in his symptoms. No anginal symptoms. He has chronic dyspnea due to a combination of COPD and still smoking and chronic systolic heart failure. His exam reveals a pleasant 76 yo man, NAD, lungs were clear with reduced breath sounds. CV revealed a RRR, and abdomen was soft and extremies with  a trace of edema. Neuro was nonfocal.   ICD interogation under my direction reveals VT with normal device function. A/P 1. VT - he has been paced back to NSR. He has had his device reprogrammed to more aggressively deliver ATP at lower heart rates without shock therapy in the first and slow VT zone. In addition we will have him increase his amiodarone for a couple of weeks. He will increase his mexitil.  2. Chronic systolic heart failure - his symptoms are class 2.  3. ICD - his [employer] device is working normally.   Mikle Bosworth.D.

## 2016-09-16 ENCOUNTER — Telehealth: Payer: Self-pay | Admitting: *Deleted

## 2016-09-16 NOTE — Telephone Encounter (Signed)
Pt was on TCM list admitted 09/14/17 for Ventricular tachycardia. Pt was D/C 6/19, and will f/u w/cardiology  [name], MD Follow up on 09/24/2016.   Specialty:  Cardiology Why:  at 12:30PM

## 2016-09-18 ENCOUNTER — Telehealth: Payer: Self-pay | Admitting: Internal Medicine

## 2016-09-18 DIAGNOSIS — I25810 Atherosclerosis of coronary artery bypass graft(s) without angina pectoris: Secondary | ICD-10-CM

## 2016-09-18 DIAGNOSIS — I5022 Chronic systolic (congestive) heart failure: Secondary | ICD-10-CM

## 2016-09-18 MED ORDER — CARVEDILOL 6.25 MG PO TABS: 6.2500 mg | ORAL_TABLET | Freq: Two times a day (BID) | ORAL | 3 refills | Status: DC

## 2016-09-18 NOTE — Telephone Encounter (Signed)
°*  STAT* If patient is at the pharmacy, call can be transferred to refill team.   1. Which medications need to be refilled? (please list name of each medication and dose if known)  carvedilol (COREG) 6.25 MG tablet Take 1 tablet (6.25 mg total) by mouth 2 (two) times daily  2. Which pharmacy/location (including street and city if local pharmacy) is medication to be sent to? [facility] [phone], [address] [place], [place], [phone]   3. Do they need a 30 day or 90 day supply? 30 day supply   Pt currently out of refills to be transferred from [place] to [place]. Needs prescription has no pills left.

## 2016-09-18 NOTE — Telephone Encounter (Signed)
Pt's medication was sent to pt's pharmacy as requested. Confirmation received.  °

## 2016-09-21 ENCOUNTER — Encounter: Payer: Self-pay | Admitting: Pulmonary Disease

## 2016-09-21 ENCOUNTER — Ambulatory Visit (INDEPENDENT_AMBULATORY_CARE_PROVIDER_SITE_OTHER): Payer: Medicare Other | Admitting: Pulmonary Disease

## 2016-09-21 VITALS — BP 124/66 | HR 52 | Ht 71.0 in | Wt 218.0 lb

## 2016-09-21 DIAGNOSIS — I255 Ischemic cardiomyopathy: Secondary | ICD-10-CM

## 2016-09-21 DIAGNOSIS — J432 Centrilobular emphysema: Secondary | ICD-10-CM

## 2016-09-21 DIAGNOSIS — F17210 Nicotine dependence, cigarettes, uncomplicated: Secondary | ICD-10-CM | POA: Diagnosis not present

## 2016-09-21 MED ORDER — MOMETASONE FURO-FORMOTEROL FUM 100-5 MCG/ACT IN AERO: 2.0000 | INHALATION_SPRAY | Freq: Two times a day (BID) | RESPIRATORY_TRACT | 11 refills | Status: DC

## 2016-09-21 MED ORDER — MOMETASONE FURO-FORMOTEROL FUM 100-5 MCG/ACT IN AERO: 2.0000 | INHALATION_SPRAY | Freq: Two times a day (BID) | RESPIRATORY_TRACT | 0 refills | Status: DC

## 2016-09-21 NOTE — Progress Notes (Signed)
Subjective:    Patient ID: Steven Guzman, male    DOB: [birth year], 76 y.o.   MRN: [id number]  Synopsis: GOLD Grade C COPD, still actively smoking as of November 2015   HPI Chief Complaint  Patient presents with  . Follow-up    pt states he is doing well, was seen in hospital last week for V-tach.     Rafal states that he had some trouble breathing a month ago.  His medications were changed and he ended up with VT and landed himself in the hospital.  Last week the VT got pretty bad and he needed to go to the ER.  Dr. Lovena Le admitted him and it sounds like he needed anti-tachycardia pacing to get him out of VTach.  His breathing has been OK.  He still has some chest congestion and mucus production.  He has a nebulizer and has only used it two times.  He didn't have a spell of bronchitis.    He is still taking Spiriva, but he has not been taking Symbicort due to cost.    He started smoking again in February but quit again on September 21 2016.  He doesn't think that Chantix would help.    Past Medical History:  Diagnosis Date  . AICD (automatic cardioverter/defibrillator) present   . Aneurysm ([place])    abdominal <4. 3.6 cm on ultrasound 09/2014.   . Anginal pain ([place])   . Anxiety   . Basal cell carcinoma of nose    S/P MOHS  . Biliary acute pancreatitis   . CAD (coronary artery disease)    a. s/p MI in 1994 with PCI to LAD at that time b. cath 10/2012 demonstrated EF 30%, inferior akinesis with mild hypokinesis of all walls, patent LAD and RCA stents; ostial PDA with 80-90% obstruction with medical therapy recommended   . Chronic bronchitis ([place])    "was having it q yr; haven't had it in a couple years" (12/21/2014)  . Chronic systolic CHF (congestive heart failure) (HCC)    EF 30 to 35 % as of 09/2014.   [name] Complication of anesthesia 10/2014   "had to have defibrillator w/ERCP"  . COPD (chronic obstructive pulmonary disease) ([place])    a. followed by pulmonary, COPD GOLD stage II  .  Depression   . Diverticulosis of colon 07/2014   noted on CT  . GERD (gastroesophageal reflux disease)   . Hiatal hernia   . Hyperglycemia 10/2012.  [name] Hyperlipidemia   . Hypertension   . Myocardial infarction [facility]) 1994; 2011  . Pneumonia 1946; 2015  . Prostate enlargement 07/2014   observed on CT  . Tobacco abuse   . Ventricular tachycardia ([place])    a. 08/2009 s/p BSX E110 Teligen 100 AICD, ser#: [id number];  b. 08/2008 VT req ATP - detection reprogrammed from 160 to 150. c. EPS and VT ablation by Dr. Lovena Le 12/21/2014     Review of Systems  Constitutional: Negative for chills, fatigue and fever.  HENT: Negative for postnasal drip, rhinorrhea and sinus pressure.   Respiratory: Negative for cough, shortness of breath and wheezing.   Cardiovascular: Negative for chest pain, palpitations and leg swelling.       Objective:   Physical Exam Vitals:   09/21/16 1407  BP: 124/66  Pulse: (!) 52  SpO2: 96%  Weight: 218 lb (98.9 kg)  Height: 5\' 11"  (1.803 m)   RA  Gen: well appearing HENT: OP clear, TM's clear, neck supple PULM: poor  air movement, some wheezing bilaterally, normal percussion CV: RRR, no mgr, trace edema GI: BS+, soft, nontender Derm: no cyanosis or rash Psyche: normal mood and affect   Records reviewed from his hospitalization a week ago for ventricular tachycardia.  PFT: 10/2013 PFT> ratio 61%, FEV1 2.53L (77% pred, 15% change), TLC 8.65 L (117% pred), RV 4.25L (161% pred), DLCO 25.39 (73% pred)  Procedure: Bronchoscopy 01/09/2016 normal airways  Imaging: July 2017 CT chest images personally reviewed showing mild to moderate centrilobular emphysema, no obvious pulmonary parenchymal mass. February 2018 chest x-ray images independently reviewed showing emphysema, pacemaker in place, mild cardiomegaly, no other pulmonary parenchymal abnormalities.    Assessment & Plan:   Centrilobular emphysema ([place])  Cigarette smoker   Discussion: This is been a stable  interval for Esa. He has not had an exacerbation since the last visit. Unfortunately he continues to smoke cigarettes. Ruthe Mannan is a cheaper option for him than Symbicort. We will change him to that medicine.  Plan: For your tobacco abuse: Stay off cigarettes Congratulations on quitting smoking  For your COPD: Continue taking Spiriva daily We will give you a sample of Dulera, take 2 puffs twice a day  We will see you back in 6 months or sooner if needed  Updated Medication List Outpatient Encounter Prescriptions as of 09/21/2016  Medication Sig  . albuterol (PROVENTIL) (2.5 MG/3ML) 0.083% nebulizer solution Take 3 mLs (2.5 mg total) by nebulization every 6 (six) hours as needed for wheezing or shortness of breath.  [name] amiodarone (PACERONE) 200 MG tablet Take 1 tablet twice daily for 2 weeks then 1 tablet daily.  [name] atorvastatin (LIPITOR) 80 MG tablet take 1 tablet by mouth once daily  . benazepril (LOTENSIN) 10 MG tablet take 1 tablet by mouth once daily  . buPROPion (WELLBUTRIN) 75 MG tablet TAKE 1 TABLET BY MOUTH ONCE DAILY  . busPIRone (BUSPAR) 15 MG tablet take 1 tablet by mouth three times a day  . carvedilol (COREG) 6.25 MG tablet Take 1 tablet (6.25 mg total) by mouth 2 (two) times daily.  . cetirizine (ZYRTEC) 10 MG tablet Take 10 mg by mouth daily as needed for allergies.  . Choline Fenofibrate (FENOFIBRIC ACID) 135 MG CPDR take 1 capsule by mouth once daily  . escitalopram (LEXAPRO) 20 MG tablet TAKE 1 TABLET BY MOUTH ONCE DAILY  . fluticasone (FLONASE) 50 MCG/ACT nasal spray Place 2 sprays into both nostrils daily.  . furosemide (LASIX) 40 MG tablet take 1/2 tablet by mouth once daily  . gabapentin (NEURONTIN) 100 MG capsule TAKE 3 CAPSULES BY MOUTH TWICE A DAY  . Ipratropium-Albuterol (COMBIVENT RESPIMAT) 20-100 MCG/ACT AERS respimat Inhale 1 puff into the lungs every 6 (six) hours. Shortness of breath or wheezing  . mexiletine (MEXITIL) 200 MG capsule Take 1 capsule (200 mg  total) by mouth 3 (three) times daily.  . nitroGLYCERIN (NITROSTAT) 0.4 MG SL tablet Place 1 tablet (0.4 mg total) under the tongue every 5 (five) minutes x 3 doses as needed for chest pain.  [name] omeprazole (PRILOSEC) 20 MG capsule Take 20 mg by mouth 2 (two) times daily.  [name] SPIRIVA HANDIHALER 18 MCG inhalation capsule inhale the contents of one capsule in the handihaler once daily  . tamsulosin (FLOMAX) 0.4 MG CAPS capsule TAKE 1 CAPSULE BY MOUTH ONCE DAILY AFTER SUPPER  . XARELTO 20 MG TABS tablet take 1 tablet by mouth once daily with SUPPER  . [DISCONTINUED] SYMBICORT 160-4.5 MCG/ACT inhaler inhale 2 puffs by mouth every morning  then 2 puffs every evening SEPARTE DOSES BY 12 HOURS (Patient not taking: Reported on 09/21/2016)   No facility-administered encounter medications on file as of 09/21/2016.

## 2016-09-21 NOTE — Patient Instructions (Addendum)
For your tobacco abuse: Stay off cigarettes Congratulations on quitting smoking  For your COPD: Continue taking Spiriva daily We will give you a sample of Dulera, take 2 puffs twice a day  We will see you back in 6 months or sooner if needed

## 2016-09-23 ENCOUNTER — Telehealth: Payer: Self-pay | Admitting: Pulmonary Disease

## 2016-09-23 NOTE — Telephone Encounter (Signed)
Received a fax from [facility] stating that Dulera 100-5 mcg is not covered by the patient's insurance. The preferred alternative is Qvar.   BQ, do you want to switch to the Qvar or start the PA process for [facility]?

## 2016-09-24 ENCOUNTER — Ambulatory Visit (INDEPENDENT_AMBULATORY_CARE_PROVIDER_SITE_OTHER): Payer: Medicare Other | Admitting: Internal Medicine

## 2016-09-24 ENCOUNTER — Encounter: Payer: Self-pay | Admitting: Internal Medicine

## 2016-09-24 VITALS — BP 92/68 | HR 73 | Ht 71.0 in | Wt 215.2 lb

## 2016-09-24 DIAGNOSIS — I255 Ischemic cardiomyopathy: Secondary | ICD-10-CM

## 2016-09-24 DIAGNOSIS — Z95810 Presence of automatic (implantable) cardiac defibrillator: Secondary | ICD-10-CM | POA: Diagnosis not present

## 2016-09-24 DIAGNOSIS — I5022 Chronic systolic (congestive) heart failure: Secondary | ICD-10-CM | POA: Diagnosis not present

## 2016-09-24 DIAGNOSIS — I10 Essential (primary) hypertension: Secondary | ICD-10-CM | POA: Diagnosis not present

## 2016-09-24 MED ORDER — FLUTICASONE FUROATE-VILANTEROL 100-25 MCG/INH IN AEPB: 1.0000 | INHALATION_SPRAY | Freq: Every day | RESPIRATORY_TRACT | 6 refills | Status: DC

## 2016-09-24 NOTE — Progress Notes (Signed)
HPI Mr. Vert presents today for ongoing followup of his VT. He is a pleasant 76 yo man with chronic systolic heart failure, VT, s/p ICD implant, s/p VT ablation over 2 years ago. He was in the hospital last week with more very slow VT and we paced him back to NSR, increased his dose of amio back to 200 bid and his mexitil back to 200 tid and he returns for followup. He admits to sun exposure but denies chest pain or sob.  Allergies  Allergen Reactions  . Sulfa Antibiotics Hives     Current Outpatient Prescriptions  Medication Sig Dispense Refill  . albuterol (PROVENTIL) (2.5 MG/3ML) 0.083% nebulizer solution Take 3 mLs (2.5 mg total) by nebulization every 6 (six) hours as needed for wheezing or shortness of breath. 360 mL 12  . amiodarone (PACERONE) 200 MG tablet Take 1 tablet twice daily for 2 weeks then 1 tablet daily. 90 tablet 3  . atorvastatin (LIPITOR) 80 MG tablet take 1 tablet by mouth once daily 30 tablet 9  . benazepril (LOTENSIN) 10 MG tablet take 1 tablet by mouth once daily 90 tablet 2  . buPROPion (WELLBUTRIN) 75 MG tablet TAKE 1 TABLET BY MOUTH ONCE DAILY 90 tablet 0  . busPIRone (BUSPAR) 15 MG tablet take 1 tablet by mouth three times a day 90 tablet 5  . carvedilol (COREG) 6.25 MG tablet Take 1 tablet (6.25 mg total) by mouth 2 (two) times daily. 180 tablet 3  . cetirizine (ZYRTEC) 10 MG tablet Take 10 mg by mouth daily as needed for allergies.    . Choline Fenofibrate (FENOFIBRIC ACID) 135 MG CPDR take 1 capsule by mouth once daily 30 capsule 9  . escitalopram (LEXAPRO) 20 MG tablet TAKE 1 TABLET BY MOUTH ONCE DAILY 30 tablet 0  . fluticasone (FLONASE) 50 MCG/ACT nasal spray Place 2 sprays into both nostrils daily.    . fluticasone furoate-vilanterol (BREO ELLIPTA) 100-25 MCG/INH AEPB Inhale 1 puff into the lungs daily. 60 each 6  . furosemide (LASIX) 40 MG tablet take 1/2 tablet by mouth once daily 45 tablet 3  . gabapentin (NEURONTIN) 100 MG capsule TAKE 3  CAPSULES BY MOUTH TWICE A DAY 540 capsule 0  . Ipratropium-Albuterol (COMBIVENT RESPIMAT) 20-100 MCG/ACT AERS respimat Inhale 1 puff into the lungs every 6 (six) hours. Shortness of breath or wheezing    . mexiletine (MEXITIL) 200 MG capsule Take 1 capsule (200 mg total) by mouth 3 (three) times daily. 270 capsule 3  . mometasone-formoterol (DULERA) 100-5 MCG/ACT AERO Inhale 2 puffs into the lungs 2 (two) times daily. 1 Inhaler 11  . nitroGLYCERIN (NITROSTAT) 0.4 MG SL tablet Place 1 tablet (0.4 mg total) under the tongue every 5 (five) minutes x 3 doses as needed for chest pain. 25 tablet 11  . omeprazole (PRILOSEC) 20 MG capsule Take 20 mg by mouth 2 (two) times daily.    [name] SPIRIVA HANDIHALER 18 MCG inhalation capsule inhale the contents of one capsule in the handihaler once daily 30 capsule 5  . tamsulosin (FLOMAX) 0.4 MG CAPS capsule TAKE 1 CAPSULE BY MOUTH ONCE DAILY AFTER SUPPER 30 capsule 0  . XARELTO 20 MG TABS tablet take 1 tablet by mouth once daily with SUPPER 30 tablet 6   No current facility-administered medications for this visit.      Past Medical History:  Diagnosis Date  . AICD (automatic cardioverter/defibrillator) present   . Aneurysm ([place])    abdominal <4. 3.6  cm on ultrasound 09/2014.   . Anginal pain ([place])   . Anxiety   . Basal cell carcinoma of nose    S/P MOHS  . Biliary acute pancreatitis   . CAD (coronary artery disease)    a. s/p MI in 1994 with PCI to LAD at that time b. cath 10/2012 demonstrated EF 30%, inferior akinesis with mild hypokinesis of all walls, patent LAD and RCA stents; ostial PDA with 80-90% obstruction with medical therapy recommended   . Chronic bronchitis ([place])    "was having it q yr; haven't had it in a couple years" (12/21/2014)  . Chronic systolic CHF (congestive heart failure) (HCC)    EF 30 to 35 % as of 09/2014.   [name] Complication of anesthesia 10/2014   "had to have defibrillator w/ERCP"  . COPD (chronic obstructive pulmonary disease)  ([place])    a. followed by pulmonary, COPD GOLD stage II  . Depression   . Diverticulosis of colon 07/2014   noted on CT  . GERD (gastroesophageal reflux disease)   . Hiatal hernia   . Hyperglycemia 10/2012.  [name] Hyperlipidemia   . Hypertension   . Myocardial infarction [facility]) 1994; 2011  . Pneumonia 1946; 2015  . Prostate enlargement 07/2014   observed on CT  . Tobacco abuse   . Ventricular tachycardia ([place])    a. 08/2009 s/p BSX E110 Teligen 100 AICD, ser#: [id number];  b. 08/2008 VT req ATP - detection reprogrammed from 160 to 150. c. EPS and VT ablation by Dr. Lovena Le 12/21/2014    ROS:   All systems reviewed and negative except as noted in the HPI.   Past Surgical History:  Procedure Laterality Date  . CATARACT EXTRACTION W/ INTRAOCULAR LENS  IMPLANT, BILATERAL Bilateral ~ 2011  . COLONOSCOPY    . ELECTROPHYSIOLOGIC STUDY N/A 12/21/2014   Procedure: V Tach Ablation;  Surgeon: [name], MD;  Location: [place] [facility];  Service: Cardiovascular;  Laterality: N/A;  . ERCP N/A 11/16/2014   Procedure: ENDOSCOPIC RETROGRADE CHOLANGIOPANCREATOGRAPHY (ERCP);  Surgeon: [name], MD;  Location: [place];  Service: Endoscopy;  Laterality: N/A;  . EYE SURGERY    . FOOT SURGERY Left 2005   "fixed bone that stuck out in my ankle area"  . HEMORRHOID BANDING    . IMPLANTABLE CARDIOVERTER DEFIBRILLATOR IMPLANT  09/06/09   BSX dual chamber ICD implanted in [place] for cardiac arrest 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  . INGUINAL HERNIA REPAIR Right ~ 1995  . LEFT HEART CATHETERIZATION WITH CORONARY ANGIOGRAM N/A 11/25/2012   demonstrated EF 30%, inferior akinesis with mild hypokinesis of all walls, patent LAD and RCA stents; ostial PDA with 80-90% obstruction with medical therapy recommended  . MOHS SURGERY  2008   nose, skin graft  . RETINAL DETACHMENT SURGERY Right 2013  . TENOLYSIS Right 12/21/2013   Procedure: TENOLYSIS FLEXOR CARPI RADIALIS ,DEBRIDEMENT RIGHT JOINT WRIST,DEBRIDEMENT  SCAPHOTRAPEZIAL TRAPEZOID, REPAIR OF EXTENSOR HOOD;  Surgeon: Daryll Brod, MD;  Location: [place];  Service: Orthopedics;  Laterality: Right;  . V-TACH ABLATION  12/21/2014  . VIDEO BRONCHOSCOPY Bilateral 01/09/2016   Procedure: VIDEO BRONCHOSCOPY WITHOUT FLUORO;  Surgeon: Juanito Doom, MD;  Location: [facility];  Service: Cardiopulmonary;  Laterality: Bilateral;     Family History  Problem Relation Age of Onset  . Heart attack Brother   . CAD Father   . Hypertension Father   . CAD Mother   . Hypertension Mother   . Hypertension Brother   .  Stroke Neg Hx      Social History   Social History  . Marital status: Married    Spouse name: N/A  . Number of children: N/A  . Years of education: N/A   Occupational History  . Retired    Social History Main Topics  . Smoking status: Current Every Day Smoker    Packs/day: 1.00    Years: 55.00    Types: Cigarettes  . Smokeless tobacco: Never Used     Comment: had quit, recently bought a pack  . Alcohol use 0.0 oz/week     Comment: 12/21/2014 "might have a couple glasses of wine/year"  . Drug use: No  . Sexual activity: Not Currently   Other Topics Concern  . Not on file   Social History Narrative  . No narrative on file     BP 92/68   Pulse 73   Ht 5\' 11"  (1.803 m)   Wt 215 lb 3.2 oz (97.6 kg)   SpO2 92%   BMI 30.01 kg/m   Physical Exam:  stable appearing 76 yo man, NAD HEENT: Unremarkable Neck:  6 cm JVD, no thyromegally Lymphatics:  No adenopathy Back:  No CVA tenderness Lungs:  Clear with no wheezes HEART:  Regular rate and rhythm, no murmurs, no rubs, no clicks Abd:  soft, positive bowel sounds, no organomegally, no rebound, no guarding Ext:  2 plus pulses, no edema, no cyanosis, no clubbing Skin:  No rashes no nodules Neuro:  CN II through XII intact, motor grossly intact  EKG - none today  DEVICE - no additional VT. Normal device function.  Assess/Plan: 1. VT - the patient has  improved. He is taking Mexitil 200 tid. He will continue 200 bid of amio for another week. 2. Chronic systolic heart failure - his symptoms are class 2. Will follow. 3. Atrial fib - he is maintaining NSR. Continue current meds. 4. ICD - his [place] device is working normally. He has a VT zone with ATP but no shocks at 100/min.  Mikle Bosworth.D.

## 2016-09-24 NOTE — Telephone Encounter (Signed)
Called [facility], given alternative of Breo 100. LM x 1 to make pt aware of medication change.

## 2016-09-24 NOTE — Telephone Encounter (Signed)
I have spoken with [facility] pharmacy, who states they are no longer able to do test claims. Baxter Flattery with [facility], Advair and Symbicort are all tier 1 medications.  Will route to BQ to make aware. Thanks.

## 2016-09-24 NOTE — Patient Instructions (Addendum)
Medication Instructions:  Your physician has recommended you make the following change in your medication:  REDUCE Amiodarone in 1 week to 1 tablet (200 mg) ONCE daily  Labwork: None Ordered   Testing/Procedures: None Ordered   Follow-Up: Your physician recommends that you schedule a follow-up appointment in: 3 months with Dr. Lovena Le    Any Other Special Instructions Will Be Listed Below (If Applicable).     If you need a refill on your cardiac medications before your next appointment, please call your pharmacy.

## 2016-09-24 NOTE — Telephone Encounter (Signed)
There must be an alternative like Breo, Advair, or Symbicort. Please have the pharmacy or patient look into this and let us know.

## 2016-09-24 NOTE — Telephone Encounter (Signed)
Breo 100 daily 

## 2016-09-28 NOTE — Telephone Encounter (Signed)
Spoke with pt. He is aware of the medication change. Pt states that he has 2 inhalers of Dulera left that he will use first. Nothing further was needed.

## 2016-09-28 NOTE — Telephone Encounter (Signed)
Pt returning call and can be reached @ [phone].Steven Guzman

## 2016-10-09 DIAGNOSIS — L03114 Cellulitis of left upper limb: Secondary | ICD-10-CM | POA: Diagnosis not present

## 2016-10-09 DIAGNOSIS — Z7901 Long term (current) use of anticoagulants: Secondary | ICD-10-CM | POA: Diagnosis not present

## 2016-10-09 DIAGNOSIS — T148XXA Other injury of unspecified body region, initial encounter: Secondary | ICD-10-CM | POA: Diagnosis not present

## 2016-10-10 ENCOUNTER — Other Ambulatory Visit: Payer: Self-pay | Admitting: Interventional Cardiology

## 2016-10-10 ENCOUNTER — Other Ambulatory Visit: Payer: Self-pay | Admitting: Family

## 2016-10-10 ENCOUNTER — Other Ambulatory Visit: Payer: Self-pay | Admitting: Internal Medicine

## 2016-10-12 NOTE — Telephone Encounter (Signed)
Will be due for office visit with upcoming refill.

## 2016-10-24 ENCOUNTER — Other Ambulatory Visit: Payer: Self-pay | Admitting: Internal Medicine

## 2016-10-30 DIAGNOSIS — Z7901 Long term (current) use of anticoagulants: Secondary | ICD-10-CM | POA: Diagnosis not present

## 2016-10-30 DIAGNOSIS — S41101A Unspecified open wound of right upper arm, initial encounter: Secondary | ICD-10-CM | POA: Diagnosis not present

## 2016-10-30 DIAGNOSIS — Z95810 Presence of automatic (implantable) cardiac defibrillator: Secondary | ICD-10-CM | POA: Diagnosis not present

## 2016-11-13 ENCOUNTER — Other Ambulatory Visit: Payer: Self-pay | Admitting: Family

## 2016-11-13 ENCOUNTER — Other Ambulatory Visit: Payer: Self-pay

## 2016-11-13 ENCOUNTER — Encounter: Payer: Self-pay | Admitting: *Deleted

## 2016-11-13 MED ORDER — ATORVASTATIN CALCIUM 80 MG PO TABS: 80.0000 mg | ORAL_TABLET | Freq: Every day | ORAL | 2 refills | Status: DC

## 2016-11-22 ENCOUNTER — Other Ambulatory Visit: Payer: Self-pay | Admitting: Family

## 2016-11-25 ENCOUNTER — Other Ambulatory Visit: Payer: Self-pay | Admitting: Interventional Cardiology

## 2016-11-25 DIAGNOSIS — I480 Paroxysmal atrial fibrillation: Secondary | ICD-10-CM

## 2016-11-26 DIAGNOSIS — Z961 Presence of intraocular lens: Secondary | ICD-10-CM | POA: Diagnosis not present

## 2016-11-26 DIAGNOSIS — H52203 Unspecified astigmatism, bilateral: Secondary | ICD-10-CM | POA: Diagnosis not present

## 2016-11-26 DIAGNOSIS — H524 Presbyopia: Secondary | ICD-10-CM | POA: Diagnosis not present

## 2016-11-26 DIAGNOSIS — H59811 Chorioretinal scars after surgery for detachment, right eye: Secondary | ICD-10-CM | POA: Diagnosis not present

## 2016-11-26 DIAGNOSIS — H43393 Other vitreous opacities, bilateral: Secondary | ICD-10-CM | POA: Diagnosis not present

## 2016-12-03 ENCOUNTER — Encounter: Payer: Medicare Other | Admitting: Internal Medicine

## 2016-12-07 ENCOUNTER — Encounter: Payer: Self-pay | Admitting: Internal Medicine

## 2016-12-07 ENCOUNTER — Other Ambulatory Visit: Payer: Self-pay | Admitting: Family

## 2016-12-07 ENCOUNTER — Ambulatory Visit (INDEPENDENT_AMBULATORY_CARE_PROVIDER_SITE_OTHER): Payer: Medicare Other | Admitting: Internal Medicine

## 2016-12-07 ENCOUNTER — Encounter: Payer: Medicare Other | Admitting: Internal Medicine

## 2016-12-07 VITALS — BP 81/55 | HR 52 | Ht 71.0 in | Wt 220.4 lb

## 2016-12-07 DIAGNOSIS — I472 Ventricular tachycardia, unspecified: Secondary | ICD-10-CM

## 2016-12-07 DIAGNOSIS — I480 Paroxysmal atrial fibrillation: Secondary | ICD-10-CM | POA: Diagnosis not present

## 2016-12-07 DIAGNOSIS — I255 Ischemic cardiomyopathy: Secondary | ICD-10-CM

## 2016-12-07 DIAGNOSIS — I5022 Chronic systolic (congestive) heart failure: Secondary | ICD-10-CM

## 2016-12-07 LAB — CUP PACEART INCLINIC DEVICE CHECK
HighPow Impedance: 43 Ohm
HighPow Impedance: 53 Ohm
Implantable Lead Implant Date: 20110610
Implantable Lead Implant Date: 20110610
Implantable Lead Location: 753860
Implantable Lead Model: 185
Implantable Lead Serial Number: 28681386
Lead Channel Impedance Value: 648 Ohm
Lead Channel Pacing Threshold Amplitude: 1.1 V
Lead Channel Pacing Threshold Pulse Width: 0.4 ms
Lead Channel Setting Pacing Amplitude: 2 V
Lead Channel Setting Pacing Pulse Width: 0.4 ms
MDC IDC LEAD LOCATION: 753859
MDC IDC LEAD SERIAL: 339643
MDC IDC MSMT LEADCHNL RA PACING THRESHOLD AMPLITUDE: 0.9 V
MDC IDC MSMT LEADCHNL RA SENSING INTR AMPL: 2.6 mV
MDC IDC MSMT LEADCHNL RV IMPEDANCE VALUE: 492 Ohm
MDC IDC MSMT LEADCHNL RV PACING THRESHOLD PULSEWIDTH: 0.4 ms
MDC IDC MSMT LEADCHNL RV SENSING INTR AMPL: 7.4 mV
MDC IDC PG IMPLANT DT: 20110610
MDC IDC PG SERIAL: 164892
MDC IDC SESS DTM: 20180910040000
MDC IDC SET LEADCHNL RA PACING AMPLITUDE: 2 V
MDC IDC SET LEADCHNL RV SENSING SENSITIVITY: 0.6 mV

## 2016-12-07 MED ORDER — BENAZEPRIL HCL 10 MG PO TABS: 5.0000 mg | ORAL_TABLET | Freq: Every day | ORAL | 3 refills | Status: DC

## 2016-12-07 NOTE — Patient Instructions (Addendum)
Medication Instructions:  Your physician has recommended you make the following change in your medication:  1.  Decrease your benazepril from 10 mg (one tablet) by mouth daily to 5 mg (1/2 tablet) by mouth daily.    Labwork: None ordered.  Testing/Procedures: None ordered.  Follow-Up: Your physician wants you to follow-up in: 3 months with Dr.Taylor.   You will receive a reminder letter in the mail two months in advance. If you don't receive a letter, please call our office to schedule the follow-up appointment.  Any Other Special Instructions Will Be Listed Below (If Applicable).     If you need a refill on your cardiac medications before your next appointment, please call your pharmacy.

## 2016-12-07 NOTE — Progress Notes (Signed)
HPI Steven Guzman returns today for ongoing evaluation and management of ventricular tachycardia, chronic systolic heart failure, and coronary artery disease. The patient feels dizzy today. He has not had syncope. No chest pain or shortness of breath. No peripheral edema. He does not think his heart has raced since we saw him last and he increased his mexiletine to 3 times a day. Allergies  Allergen Reactions  . Sulfa Antibiotics Hives  . Sulfasalazine Hives     Current Outpatient Prescriptions  Medication Sig Dispense Refill  . albuterol (PROVENTIL) (2.5 MG/3ML) 0.083% nebulizer solution Take 3 mLs (2.5 mg total) by nebulization every 6 (six) hours as needed for wheezing or shortness of breath. 360 mL 12  . amiodarone (PACERONE) 200 MG tablet Take 1 tablet twice daily for 2 weeks then 1 tablet daily. 90 tablet 3  . atorvastatin (LIPITOR) 80 MG tablet Take 1 tablet (80 mg total) by mouth daily. 90 tablet 2  . benazepril (LOTENSIN) 10 MG tablet take 1 tablet by mouth once daily 90 tablet 2  . buPROPion (WELLBUTRIN) 75 MG tablet TAKE 1 TABLET BY MOUTH ONCE DAILY 90 tablet 0  . busPIRone (BUSPAR) 15 MG tablet take 1 tablet by mouth three times a day 90 tablet 5  . carvedilol (COREG) 6.25 MG tablet Take 1 tablet (6.25 mg total) by mouth 2 (two) times daily. 180 tablet 3  . cetirizine (ZYRTEC) 10 MG tablet Take 10 mg by mouth daily as needed for allergies.    . Choline Fenofibrate (FENOFIBRIC ACID) 135 MG CPDR Take 1 capsule by mouth daily. 30 capsule 6  . escitalopram (LEXAPRO) 20 MG tablet TAKE 1 TABLET BY MOUTH EVERY DAY 30 tablet 0  . fluticasone (FLONASE) 50 MCG/ACT nasal spray Place 2 sprays into both nostrils daily.    . fluticasone furoate-vilanterol (BREO ELLIPTA) 100-25 MCG/INH AEPB Inhale 1 puff into the lungs daily. 60 each 6  . furosemide (LASIX) 40 MG tablet take 1/2 tablet by mouth once daily 45 tablet 3  . gabapentin (NEURONTIN) 100 MG capsule TAKE 3 CAPSULES BY MOUTH TWICE  A DAY 540 capsule 0  . Ipratropium-Albuterol (COMBIVENT RESPIMAT) 20-100 MCG/ACT AERS respimat Inhale 1 puff into the lungs every 6 (six) hours. Shortness of breath or wheezing    . mexiletine (MEXITIL) 200 MG capsule Take 1 capsule (200 mg total) by mouth 3 (three) times daily. 270 capsule 3  . nitroGLYCERIN (NITROSTAT) 0.4 MG SL tablet Place 1 tablet (0.4 mg total) under the tongue every 5 (five) minutes x 3 doses as needed for chest pain. 25 tablet 11  . omeprazole (PRILOSEC) 20 MG capsule Take 20 mg by mouth 2 (two) times daily.    [name] SPIRIVA HANDIHALER 18 MCG inhalation capsule INHALE THE CONTENTS OF ONE CAPSULE IN THE HANDIHALER ONCE DAILY 30 capsule 3  . tamsulosin (FLOMAX) 0.4 MG CAPS capsule TAKE 1 CAPSULE BY MOUTH EVERY DAY AFTER SUPPER 30 capsule 0  . XARELTO 20 MG TABS tablet TAKE 1 TABLET BY MOUTH ONCE DAILY WITH SUPPER 30 tablet 10   No current facility-administered medications for this visit.      Past Medical History:  Diagnosis Date  . AICD (automatic cardioverter/defibrillator) present   . Aneurysm ([place])    abdominal <4. 3.6 cm on ultrasound 09/2014.   . Anginal pain ([place])   . Anxiety   . Basal cell carcinoma of nose    S/P MOHS  . Biliary acute pancreatitis   . CAD (coronary  artery disease)    a. s/p MI in 1994 with PCI to LAD at that time b. cath 10/2012 demonstrated EF 30%, inferior akinesis with mild hypokinesis of all walls, patent LAD and RCA stents; ostial PDA with 80-90% obstruction with medical therapy recommended   . Chronic bronchitis ([place])    "was having it q yr; haven't had it in a couple years" (12/21/2014)  . Chronic systolic CHF (congestive heart failure) (HCC)    EF 30 to 35 % as of 09/2014.   [name] Complication of anesthesia 10/2014   "had to have defibrillator w/ERCP"  . COPD (chronic obstructive pulmonary disease) ([place])    a. followed by pulmonary, COPD GOLD stage II  . Depression   . Diverticulosis of colon 07/2014   noted on CT  . GERD  (gastroesophageal reflux disease)   . Hiatal hernia   . Hyperglycemia 10/2012.  [name] Hyperlipidemia   . Hypertension   . Myocardial infarction [facility]) 1994; 2011  . Pneumonia 1946; 2015  . Prostate enlargement 07/2014   observed on CT  . Tobacco abuse   . Ventricular tachycardia ([place])    a. 08/2009 s/p BSX E110 Teligen 100 AICD, ser#: [id number];  b. 08/2008 VT req ATP - detection reprogrammed from 160 to 150. c. EPS and VT ablation by Dr. Lovena Le 12/21/2014    ROS:   All systems reviewed and negative except as noted in the HPI.   Past Surgical History:  Procedure Laterality Date  . CATARACT EXTRACTION W/ INTRAOCULAR LENS  IMPLANT, BILATERAL Bilateral ~ 2011  . COLONOSCOPY    . ELECTROPHYSIOLOGIC STUDY N/A 12/21/2014   Procedure: V Tach Ablation;  Surgeon: [name], MD;  Location: [place] [facility];  Service: Cardiovascular;  Laterality: N/A;  . ERCP N/A 11/16/2014   Procedure: ENDOSCOPIC RETROGRADE CHOLANGIOPANCREATOGRAPHY (ERCP);  Surgeon: [name], MD;  Location: [place];  Service: Endoscopy;  Laterality: N/A;  . EYE SURGERY    . FOOT SURGERY Left 2005   "fixed bone that stuck out in my ankle area"  . HEMORRHOID BANDING    . IMPLANTABLE CARDIOVERTER DEFIBRILLATOR IMPLANT  09/06/09   BSX dual chamber ICD implanted in [place] for cardiac arrest 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  . INGUINAL HERNIA REPAIR Right ~ 1995  . LEFT HEART CATHETERIZATION WITH CORONARY ANGIOGRAM N/A 11/25/2012   demonstrated EF 30%, inferior akinesis with mild hypokinesis of all walls, patent LAD and RCA stents; ostial PDA with 80-90% obstruction with medical therapy recommended  . MOHS SURGERY  2008   nose, skin graft  . RETINAL DETACHMENT SURGERY Right 2013  . TENOLYSIS Right 12/21/2013   Procedure: TENOLYSIS FLEXOR CARPI RADIALIS ,DEBRIDEMENT RIGHT JOINT WRIST,DEBRIDEMENT SCAPHOTRAPEZIAL TRAPEZOID, REPAIR OF EXTENSOR HOOD;  Surgeon: Daryll Brod, MD;  Location: [place];  Service: Orthopedics;   Laterality: Right;  . V-TACH ABLATION  12/21/2014  . VIDEO BRONCHOSCOPY Bilateral 01/09/2016   Procedure: VIDEO BRONCHOSCOPY WITHOUT FLUORO;  Surgeon: Juanito Doom, MD;  [facility];  Service: Cardiopulmonary;  Laterality: Bilateral;     Family History  Problem Relation Age of Onset  . Heart attack Brother   . CAD Father   . Hypertension Father   . CAD Mother   . Hypertension Mother   . Hypertension Brother   . Stroke Neg Hx      Social History   Social History  . Marital status: Married    Spouse name: N/A  . Number of children: N/A  . Years  of education: N/A   Occupational History  . Retired    Social History Main Topics  . Smoking status: Current Every Day Smoker    Packs/day: 1.00    Years: 55.00    Types: Cigarettes  . Smokeless tobacco: Never Used     Comment: had quit, recently bought a pack  . Alcohol use 0.0 oz/week     Comment: 12/21/2014 "might have a couple glasses of wine/year"  . Drug use: No  . Sexual activity: Not Currently   Other Topics Concern  . Not on file   Social History Narrative  . No narrative on file     Ht 5\' 11"  (1.803 m)   Physical Exam:  Well appearing 76 year old man, NAD HEENT: Unremarkable Neck:  6 cm JVD, no thyromegally Lymphatics:  No adenopathy Back:  No CVA tenderness Lungs:  Clear, with no wheezes, rales, or rhonchi. HEART:  Regular rate rhythm, no murmurs, no rubs, no clicks Abd:  soft, positive bowel sounds, no organomegally, no rebound, no guarding Ext:  2 plus pulses, no edema, no cyanosis, no clubbing Skin:  No rashes no nodules Neuro:  CN II through XII intact, motor grossly intact  EKG - sinus rhythm  DEVICE  Normal device function.  See PaceArt for details.   Assess/Plan: 1. Ventricular tachycardia - his ventricular tachycardia appears to be well-controlled. He will continue amiodarone and mexiletine as currently prescribed. 2. Chronic systolic heart failure - his blood pressure is  low today. We will reduce his ACE inhibitor. 3. Paroxysmal atrial fibrillation - he is maintaining sinus rhythm very nicely. No change in medications. 4. ICD - his [place] dual-chamber ICD is working normally. We'll plan to recheck in several months.  Steven Guzman, M.D.

## 2016-12-08 ENCOUNTER — Other Ambulatory Visit: Payer: Self-pay | Admitting: Family

## 2016-12-13 ENCOUNTER — Other Ambulatory Visit: Payer: Self-pay | Admitting: Internal Medicine

## 2016-12-19 ENCOUNTER — Other Ambulatory Visit: Payer: Self-pay | Admitting: Internal Medicine

## 2016-12-21 ENCOUNTER — Ambulatory Visit (INDEPENDENT_AMBULATORY_CARE_PROVIDER_SITE_OTHER): Payer: Medicare Other

## 2016-12-21 ENCOUNTER — Other Ambulatory Visit: Payer: Self-pay | Admitting: Internal Medicine

## 2016-12-21 DIAGNOSIS — Z23 Encounter for immunization: Secondary | ICD-10-CM

## 2016-12-21 MED ORDER — ESCITALOPRAM OXALATE 20 MG PO TABS: 20.0000 mg | ORAL_TABLET | Freq: Every day | ORAL | 1 refills | Status: DC

## 2016-12-27 ENCOUNTER — Other Ambulatory Visit: Payer: Self-pay | Admitting: Family

## 2016-12-29 ENCOUNTER — Ambulatory Visit (INDEPENDENT_AMBULATORY_CARE_PROVIDER_SITE_OTHER): Payer: Medicare Other | Admitting: Internal Medicine

## 2016-12-29 ENCOUNTER — Telehealth: Payer: Self-pay

## 2016-12-29 ENCOUNTER — Other Ambulatory Visit (INDEPENDENT_AMBULATORY_CARE_PROVIDER_SITE_OTHER): Payer: Medicare Other

## 2016-12-29 ENCOUNTER — Encounter: Payer: Self-pay | Admitting: Internal Medicine

## 2016-12-29 ENCOUNTER — Other Ambulatory Visit: Payer: Medicare Other

## 2016-12-29 VITALS — BP 110/70 | HR 55 | Temp 98.6°F | Ht 71.0 in | Wt 219.0 lb

## 2016-12-29 DIAGNOSIS — E785 Hyperlipidemia, unspecified: Secondary | ICD-10-CM

## 2016-12-29 DIAGNOSIS — I255 Ischemic cardiomyopathy: Secondary | ICD-10-CM

## 2016-12-29 DIAGNOSIS — R7301 Impaired fasting glucose: Secondary | ICD-10-CM

## 2016-12-29 DIAGNOSIS — N400 Enlarged prostate without lower urinary tract symptoms: Secondary | ICD-10-CM | POA: Diagnosis not present

## 2016-12-29 DIAGNOSIS — F324 Major depressive disorder, single episode, in partial remission: Secondary | ICD-10-CM

## 2016-12-29 DIAGNOSIS — I10 Essential (primary) hypertension: Secondary | ICD-10-CM

## 2016-12-29 DIAGNOSIS — F329 Major depressive disorder, single episode, unspecified: Secondary | ICD-10-CM

## 2016-12-29 DIAGNOSIS — F17210 Nicotine dependence, cigarettes, uncomplicated: Secondary | ICD-10-CM

## 2016-12-29 DIAGNOSIS — Z0000 Encounter for general adult medical examination without abnormal findings: Secondary | ICD-10-CM

## 2016-12-29 LAB — CBC
HEMATOCRIT: 36.9 % — AB (ref 39.0–52.0)
HEMOGLOBIN: 11.7 g/dL — AB (ref 13.0–17.0)
MCHC: 31.8 g/dL (ref 30.0–36.0)
MCV: 91.2 fl (ref 78.0–100.0)
Platelets: 235 10*3/uL (ref 150.0–400.0)
RBC: 4.05 Mil/uL — ABNORMAL LOW (ref 4.22–5.81)
RDW: 15.9 % — AB (ref 11.5–15.5)
WBC: 6 10*3/uL (ref 4.0–10.5)

## 2016-12-29 LAB — LIPID PANEL
CHOLESTEROL: 146 mg/dL (ref 0–200)
HDL: 52 mg/dL (ref >39.00)
LDL CALC: 77 mg/dL (ref 0–99)
NONHDL: 93.52
Total CHOL/HDL Ratio: 3
Triglycerides: 83 mg/dL (ref 0.0–149.0)
VLDL: 16.6 mg/dL (ref 0.0–40.0)

## 2016-12-29 LAB — COMPREHENSIVE METABOLIC PANEL
ALBUMIN: 4.1 g/dL (ref 3.5–5.2)
ALT: 24 U/L (ref 0–53)
AST: 19 U/L (ref 0–37)
Alkaline Phosphatase: 32 U/L — ABNORMAL LOW (ref 39–117)
BUN: 11 mg/dL (ref 6–23)
CHLORIDE: 104 meq/L (ref 96–112)
CO2: 30 meq/L (ref 19–32)
CREATININE: 1.17 mg/dL (ref 0.40–1.50)
Calcium: 9.5 mg/dL (ref 8.4–10.5)
GFR: 64.06 mL/min (ref >60.00)
GLUCOSE: 95 mg/dL (ref 70–99)
Potassium: 4.2 mEq/L (ref 3.5–5.1)
SODIUM: 139 meq/L (ref 135–145)
Total Bilirubin: 0.8 mg/dL (ref 0.2–1.2)
Total Protein: 6.8 g/dL (ref 6.0–8.3)

## 2016-12-29 LAB — HEMOGLOBIN A1C: Hgb A1c MFr Bld: 5.6 % (ref 4.6–6.5)

## 2016-12-29 NOTE — Patient Instructions (Signed)
We are checking the labs today and will call you back with the results.    

## 2016-12-29 NOTE — Progress Notes (Signed)
   Subjective:    Patient ID: Steven Guzman, male    DOB: Apr 05, 1940, 76 y.o.   MRN: [id number]  HPI The patient is a 76 YO man coming in for follow up of his medical problems including his mood (taking bupropion and lexapro as well as buspar for anxiety, stable at this time, some ups and downs this year but he is coping okay, no counseling, denies SI/HI), and cholesterol (taking his lipitor, denies side effects, no chest pains or SOB), and his blood pressure (taking benazepril, coreg, lasix, flomax), and his BPH (he feels that he should have a prostate exam but does not want it done here, wants a urologist to do it, taking flomax which is adequately controlling his symptoms for now).   Here for medicare wellness, no new complaints. Please see A/P for status and treatment of chronic medical problems.   Diet: heart healthy Physical activity: sedentary Depression/mood screen: negative Hearing: moderate loss, declines hearing test Visual acuity: grossly normal, performs annual eye exam  ADLs: capable Fall risk: none Home safety: good Cognitive evaluation: intact to orientation, naming, recall and repetition EOL planning: adv directives discussed  I have personally reviewed and have noted 1. The patient's medical and social history - reviewed today no changes 2. Their use of alcohol, tobacco or illicit drugs 3. Their current medications and supplements 4. The patient's functional ability including ADL's, fall risks, home safety risks and hearing or visual impairment. 5. Diet and physical activities 6. Evidence for depression or mood disorders 7. Care team reviewed and updated (available in snapshot)  Review of Systems  Constitutional: Negative.   HENT: Negative.   Eyes: Negative.   Respiratory: Positive for cough. Negative for chest tightness and shortness of breath.   Cardiovascular: Negative for chest pain, palpitations and leg swelling.  Gastrointestinal: Negative for abdominal  distention, abdominal pain, constipation, diarrhea, nausea and vomiting.  Musculoskeletal: Negative.   Skin: Negative.   Neurological: Negative.   Psychiatric/Behavioral: Negative.       Objective:   Physical Exam  Constitutional: He is oriented to person, place, and time. He appears well-developed and well-nourished.  HENT:  Head: Normocephalic and atraumatic.  Eyes: EOM are normal.  Neck: Normal range of motion.  Cardiovascular: Normal rate and regular rhythm.   Pulmonary/Chest: Effort normal and breath sounds normal. No respiratory distress. He has no wheezes. He has no rales.  Abdominal: Soft. Bowel sounds are normal. He exhibits no distension. There is no tenderness. There is no rebound.  Musculoskeletal: He exhibits no edema.  Neurological: He is alert and oriented to person, place, and time. Coordination normal.  Skin: Skin is warm and dry.  Psychiatric: He has a normal mood and affect.   Vitals:   12/29/16 1524  BP: 110/70  Pulse: (!) 55  Temp: 98.6 F (37 C)  TempSrc: Oral  SpO2: 98%  Weight: 219 lb (99.3 kg)  Height: 5\' 11"  (1.803 m)      Assessment & Plan:

## 2016-12-29 NOTE — Telephone Encounter (Signed)
Spoke with patient regarding ATP episodes for Sinus tach on 9/28 and 9/29. Patient reports forgetting to take his mexiletine one day this past week as well as an unusually exertional day on 9/29. I educated patient on importance of taking medication as scheduled. Patient verbalizes understanding.

## 2016-12-31 ENCOUNTER — Encounter: Payer: Self-pay | Admitting: Internal Medicine

## 2016-12-31 DIAGNOSIS — N400 Enlarged prostate without lower urinary tract symptoms: Secondary | ICD-10-CM | POA: Insufficient documentation

## 2016-12-31 NOTE — Assessment & Plan Note (Signed)
Referral to urology for evaluation per request. Taking flomax which is adequately controlling his symptoms.

## 2016-12-31 NOTE — Assessment & Plan Note (Signed)
Taking bupropion and lexapro and buspar. Refill as needed and stable symptoms at this time.

## 2016-12-31 NOTE — Assessment & Plan Note (Signed)
Flu shot up to date, counseled about shingrix. Tetanus and pneumonia up to date, colonoscopy up to date.

## 2016-12-31 NOTE — Assessment & Plan Note (Signed)
BP at goal on lasix, coreg, benazepril. Checking CMP and adjust as needed.

## 2016-12-31 NOTE — Assessment & Plan Note (Signed)
Chronic cough and breathing problems. Reminded of the need to make an attempt to quit but he is not able to do that today. Reminded about the risk and harm from smoking.

## 2017-01-06 ENCOUNTER — Encounter: Payer: Self-pay | Admitting: Nurse Practitioner

## 2017-01-06 ENCOUNTER — Ambulatory Visit (INDEPENDENT_AMBULATORY_CARE_PROVIDER_SITE_OTHER): Payer: Medicare Other | Admitting: Nurse Practitioner

## 2017-01-06 VITALS — BP 96/60 | HR 50 | Temp 97.5°F | Ht 71.0 in | Wt 221.0 lb

## 2017-01-06 DIAGNOSIS — K1121 Acute sialoadenitis: Secondary | ICD-10-CM | POA: Diagnosis not present

## 2017-01-06 MED ORDER — AMOXICILLIN-POT CLAVULANATE 875-125 MG PO TABS: 1.0000 | ORAL_TABLET | Freq: Two times a day (BID) | ORAL | 0 refills | Status: DC

## 2017-01-06 NOTE — Patient Instructions (Signed)
Parotitis Parotitis means that you have irritation and swelling (inflammation) in one or both of your parotid glands. These glands make spit (saliva). They are found on each side of your face, below and in front of your earlobes. You may or may not have pain with this condition. Follow these instructions at home: Medicines  Take over-the-counter and prescription medicines only as told by your doctor.  If you were prescribed an antibiotic medicine, take it as told by your doctor. Do not stop taking the antibiotic even if you start to feel better. Managing pain and swelling  Apply warm cloths (compresses) to the swollen area as told by your doctor.  Gently rub your parotid glands as told by your doctor. General instructions   Drink enough fluid to keep your pee (urine) clear or pale yellow.  Suck on sour candy. This may help: ? To make your mouth less dry. ? To make more spit.  Keep your mouth clean and moist. ? Gargle with a salt-water mixture 3-4 times per day, or as needed. ? To make a salt-water mixture, stir -1 tsp of salt into 1 cup of warm water.  Take good care of your mouth: ? Brush your teeth at least two times per day. ? Floss your teeth every day. ? See your dentist regularly.  Do not use tobacco products. These include cigarettes, chewing tobacco, or e-cigarettes. If you need help quitting,  ask your doctor.  Keep all follow-up visits as told by your doctor. This is important. Contact a doctor if:  You have a fever or chills.  You have new symptoms.  Your symptoms get worse.  Your symptoms do not get better with treatment. This information is not intended to replace advice given to you by your health care provider. Make sure you discuss any questions you have with your health care provider. Document Released: 04/18/2010 Document Revised: 08/22/2015 Document Reviewed: 08/09/2014 Elsevier Interactive Patient Education  2018 Elsevier Inc.  

## 2017-01-06 NOTE — Progress Notes (Signed)
Subjective:  Patient ID: Steven Guzman, male    DOB: 12-16-1940  Age: 76 y.o. MRN: [id number]  CC: Mass (bump on right cheek under the ear, painful at times when talking. notice this 2 days ago. )   HPI [name] presents with right jaw swelling and tenderness x 24hrs. Denies any head injury or recent dental procedure. Has pain with talking, palpation. Denies any ear pain or scalp tenderness or sore throat or neck pain or rash or fever or change in vision.  Outpatient Medications Prior to Visit  Medication Sig Dispense Refill  . albuterol (PROVENTIL) (2.5 MG/3ML) 0.083% nebulizer solution Take 3 mLs (2.5 mg total) by nebulization every 6 (six) hours as needed for wheezing or shortness of breath. 360 mL 12  . amiodarone (PACERONE) 200 MG tablet Take 1 tablet twice daily for 2 weeks then 1 tablet daily. 90 tablet 3  . atorvastatin (LIPITOR) 80 MG tablet Take 1 tablet (80 mg total) by mouth daily. 90 tablet 2  . benazepril (LOTENSIN) 10 MG tablet Take 0.5 tablets (5 mg total) by mouth daily. 45 tablet 3  . buPROPion (WELLBUTRIN) 75 MG tablet Take 1 tablet (75 mg total) by mouth daily. Need office visit for further refills 90 tablet 0  . busPIRone (BUSPAR) 15 MG tablet take 1 tablet by mouth three times a day 90 tablet 5  . carvedilol (COREG) 6.25 MG tablet Take 1 tablet (6.25 mg total) by mouth 2 (two) times daily. 180 tablet 3  . cetirizine (ZYRTEC) 10 MG tablet Take 10 mg by mouth daily as needed for allergies.    . Choline Fenofibrate (FENOFIBRIC ACID) 135 MG CPDR Take 1 capsule by mouth daily. 30 capsule 6  . escitalopram (LEXAPRO) 20 MG tablet Take 1 tablet (20 mg total) by mouth daily. Need office visit for further refills 90 tablet 1  . fluticasone (FLONASE) 50 MCG/ACT nasal spray Place 2 sprays into both nostrils daily.    . fluticasone furoate-vilanterol (BREO ELLIPTA) 100-25 MCG/INH AEPB Inhale 1 puff into the lungs daily. 60 each 6  . furosemide (LASIX) 40 MG tablet take 1/2  tablet by mouth once daily 45 tablet 3  . gabapentin (NEURONTIN) 100 MG capsule TAKE 3 CAPSULES BY MOUTH TWICE DAILY 540 capsule 0  . Ipratropium-Albuterol (COMBIVENT RESPIMAT) 20-100 MCG/ACT AERS respimat Inhale 1 puff into the lungs every 6 (six) hours. Shortness of breath or wheezing    . mexiletine (MEXITIL) 200 MG capsule Take 1 capsule (200 mg total) by mouth 3 (three) times daily. 270 capsule 3  . nitroGLYCERIN (NITROSTAT) 0.4 MG SL tablet Place 1 tablet (0.4 mg total) under the tongue every 5 (five) minutes x 3 doses as needed for chest pain. 25 tablet 11  . omeprazole (PRILOSEC) 20 MG capsule Take 20 mg by mouth 2 (two) times daily.    [name] SPIRIVA HANDIHALER 18 MCG inhalation capsule INHALE THE CONTENTS OF ONE CAPSULE IN THE HANDIHALER ONCE DAILY 30 capsule 3  . tamsulosin (FLOMAX) 0.4 MG CAPS capsule TAKE 1 CAPSULE BY MOUTH EVERY DAY AFTER SUPPER 30 capsule 3  . XARELTO 20 MG TABS tablet TAKE 1 TABLET BY MOUTH ONCE DAILY WITH SUPPER 30 tablet 10   No facility-administered medications prior to visit.     ROS See HPI  Objective:  BP 96/60   Pulse (!) 50   Temp (!) 97.5 F (36.4 C)   Ht 5\' 11"  (1.803 m)   Wt 221 lb (100.2 kg)   SpO2  97%   BMI 30.82 kg/m   BP Readings from Last 3 Encounters:  01/06/17 96/60  12/29/16 110/70  12/07/16 (!) 81/55    Wt Readings from Last 3 Encounters:  01/06/17 221 lb (100.2 kg)  12/29/16 219 lb (99.3 kg)  12/07/16 220 lb 6.4 oz (100 kg)    Physical Exam  HENT:  Head:    Right Ear: Tympanic membrane, external ear and ear canal normal.  Left Ear: Tympanic membrane, external ear and ear canal normal.  Nose: Nose normal. Right sinus exhibits no maxillary sinus tenderness and no frontal sinus tenderness. Left sinus exhibits no maxillary sinus tenderness and no frontal sinus tenderness.  Mouth/Throat: Uvula is midline and oropharynx is clear and moist. He does not have dentures. No oral lesions. No trismus in the jaw. Abnormal dentition. No  dental abscesses or uvula swelling. No oropharyngeal exudate or posterior oropharyngeal erythema.  Eyes: Conjunctivae and EOM are normal.  Neck: Normal range of motion. Neck supple. No thyromegaly present.  Cardiovascular: Normal rate.   Pulmonary/Chest: Effort normal.  Lymphadenopathy:    He has cervical adenopathy.  Vitals reviewed.   Lab Results  Component Value Date   WBC 6.0 12/29/2016   HGB 11.7 (L) 12/29/2016   HCT 36.9 (L) 12/29/2016   PLT 235.0 12/29/2016   GLUCOSE 95 12/29/2016   CHOL 146 12/29/2016   TRIG 83.0 12/29/2016   HDL 52.00 12/29/2016   LDLCALC 77 12/29/2016   ALT 24 12/29/2016   AST 19 12/29/2016   NA 139 12/29/2016   K 4.2 12/29/2016   CL 104 12/29/2016   CREATININE 1.17 12/29/2016   BUN 11 12/29/2016   CO2 30 12/29/2016   TSH 2.660 04/29/2016   INR 2.47 09/14/2016   HGBA1C 5.6 12/29/2016    Dg Chest 2 View  Result Date: 09/14/2016 CLINICAL DATA:  Shortness of breath. Sudden onset of shortness breath earlier tonight. EXAM: CHEST  2 VIEW COMPARISON:  05/07/2016 FINDINGS: Left-sided pacemaker remains in place. Stable heart size and mediastinal contours with retrocardiac hiatal hernia. The lungs are hyperinflated. No pulmonary edema, consolidation, pleural fluid or pneumothorax. No acute osseous abnormality. IMPRESSION: Chronic hyperinflation, can be seen with COPD. No acute abnormality. Electronically Signed   By: Jeb Levering M.D.   On: 09/14/2016 23:56    Assessment & Plan:   [place] was seen today for mass.  Diagnoses and all orders for this visit:  Parotitis, acute -     amoxicillin-clavulanate (AUGMENTIN) 875-125 MG tablet; Take 1 tablet by mouth 2 (two) times daily.   I am having Mr. Nishikawa start on amoxicillin-clavulanate. I am also having him maintain his omeprazole, cetirizine, Ipratropium-Albuterol, nitroGLYCERIN, fluticasone, albuterol, furosemide, busPIRone, amiodarone, mexiletine, carvedilol, fluticasone furoate-vilanterol,  Fenofibric Acid, SPIRIVA HANDIHALER, atorvastatin, XARELTO, benazepril, buPROPion, gabapentin, escitalopram, and tamsulosin.  Meds ordered this encounter  Medications  . amoxicillin-clavulanate (AUGMENTIN) 875-125 MG tablet    Sig: Take 1 tablet by mouth 2 (two) times daily.    Dispense:  14 tablet    Refill:  0    Order Specific Question:   Supervising Provider    Answer:   Cassandria Anger [[id number]]    Follow-up: Return if symptoms worsen or fail to improve.  Wilfred Lacy, NP

## 2017-01-13 ENCOUNTER — Other Ambulatory Visit: Payer: Self-pay | Admitting: Internal Medicine

## 2017-01-28 ENCOUNTER — Telehealth: Payer: Self-pay | Admitting: Pulmonary Disease

## 2017-01-28 MED ORDER — IPRATROPIUM-ALBUTEROL 20-100 MCG/ACT IN AERS: 1.0000 | INHALATION_SPRAY | Freq: Four times a day (QID) | RESPIRATORY_TRACT | 3 refills | Status: DC

## 2017-01-28 NOTE — Telephone Encounter (Signed)
Spoke with pt, he needed his Combivent to go to [facility] in [place]. Sent in Rx and nothing further is needed.

## 2017-02-08 ENCOUNTER — Telehealth: Payer: Self-pay

## 2017-02-08 MED ORDER — MEXILETINE HCL 200 MG PO CAPS: 200.0000 mg | ORAL_CAPSULE | Freq: Three times a day (TID) | ORAL | 3 refills | Status: DC

## 2017-02-08 NOTE — Telephone Encounter (Signed)
Call placed to Pt.  Confirmed Pt pharmacy.  Notified Pt would have pharmacy fill his prescription for mexiletine. Call placed to [facility].  Pharmacy to fill prescription.  No further action needed.

## 2017-02-08 NOTE — Telephone Encounter (Signed)
-----   Message from Tod Persia, [place] sent at 02/08/2017  1:57 PM EST ----- Regarding: medication question Hi,  Patient called and said at last office visit, D. Lovena Le and he discussed increasing the mexiletine from BID back to TID due to increased heart rate. Note says continue as currently prescribed. Patient will be out and can't refill until 11/19 with current directions.   Please call to discuss and send in new script if ok.   Thanks  -Abby S

## 2017-02-15 DIAGNOSIS — N5201 Erectile dysfunction due to arterial insufficiency: Secondary | ICD-10-CM | POA: Diagnosis not present

## 2017-02-15 DIAGNOSIS — R351 Nocturia: Secondary | ICD-10-CM | POA: Diagnosis not present

## 2017-02-15 DIAGNOSIS — N401 Enlarged prostate with lower urinary tract symptoms: Secondary | ICD-10-CM | POA: Diagnosis not present

## 2017-02-22 ENCOUNTER — Encounter: Payer: Self-pay | Admitting: Interventional Cardiology

## 2017-03-04 ENCOUNTER — Other Ambulatory Visit: Payer: Self-pay

## 2017-03-12 ENCOUNTER — Other Ambulatory Visit: Payer: Self-pay | Admitting: Internal Medicine

## 2017-03-15 ENCOUNTER — Other Ambulatory Visit: Payer: Self-pay | Admitting: Internal Medicine

## 2017-03-16 ENCOUNTER — Encounter: Payer: Self-pay | Admitting: Pulmonary Disease

## 2017-03-16 ENCOUNTER — Other Ambulatory Visit: Payer: Self-pay | Admitting: Internal Medicine

## 2017-03-16 ENCOUNTER — Ambulatory Visit (INDEPENDENT_AMBULATORY_CARE_PROVIDER_SITE_OTHER): Payer: Medicare Other | Admitting: Pulmonary Disease

## 2017-03-16 VITALS — BP 126/78 | HR 64 | Ht 71.0 in | Wt 223.4 lb

## 2017-03-16 DIAGNOSIS — I255 Ischemic cardiomyopathy: Secondary | ICD-10-CM | POA: Diagnosis not present

## 2017-03-16 DIAGNOSIS — J432 Centrilobular emphysema: Secondary | ICD-10-CM | POA: Diagnosis not present

## 2017-03-16 DIAGNOSIS — F17210 Nicotine dependence, cigarettes, uncomplicated: Secondary | ICD-10-CM | POA: Diagnosis not present

## 2017-03-16 MED ORDER — FLUTICASONE-UMECLIDIN-VILANT 100-62.5-25 MCG/INH IN AEPB: 1.0000 | INHALATION_SPRAY | Freq: Every day | RESPIRATORY_TRACT | 0 refills | Status: DC

## 2017-03-16 MED ORDER — FLUTTER DEVI: 0 refills | Status: AC

## 2017-03-16 NOTE — Progress Notes (Signed)
Subjective:    Patient ID: Steven Guzman, male    DOB: 1941-01-17, 76 y.o.   MRN: [id number]  Synopsis: GOLD Grade C COPD, still actively smoking as of November 2015   HPI Chief Complaint  Patient presents with  . Follow-up    56mo rov- pt states breathing is doing well overall. pt reports of prod cough with white to yellow mucus.    H[place]son and his wife quit smoking cigarettes on July 16 and they have not looked back since then.  In general he has been doing OK.  He hasn't had an episode of bronchitis since the last visit.  He has mucus in his throat which is thick white to yellow in color. He says that mucus will hang up in his throat.  He says this happens throughout the day.  He still has some shortness of breath, but its not worse than before.  He has night sweats often, typically several times per week.  He has noticed some discoloration of his hands and he wonders if this is related to hi mucinex.  He continues to use [place] and Spiriva daily.    Past Medical History:  Diagnosis Date  . AICD (automatic cardioverter/defibrillator) present   . Aneurysm ([place])    abdominal <4. 3.6 cm on ultrasound 09/2014.   . Anginal pain ([place])   . Anxiety   . Basal cell carcinoma of nose    S/P MOHS  . Biliary acute pancreatitis   . CAD (coronary artery disease)    a. s/p MI in 1994 with PCI to LAD at that time b. cath 10/2012 demonstrated EF 30%, inferior akinesis with mild hypokinesis of all walls, patent LAD and RCA stents; ostial PDA with 80-90% obstruction with medical therapy recommended   . Chronic bronchitis ([place])    "was having it q yr; haven't had it in a couple years" (12/21/2014)  . Chronic systolic CHF (congestive heart failure) ([facility])    EF 30 to 35 % as of 09/2014.   [name] Complication of anesthesia 10/2014   "had to have defibrillator w/ERCP"  . COPD (chronic obstructive pulmonary disease) ([place])    a. followed by pulmonary, COPD GOLD stage II  . Depression   . Diverticulosis of colon  07/2014   noted on CT  . GERD (gastroesophageal reflux disease)   . Hiatal hernia   . Hyperglycemia 10/2012.  [name] Hyperlipidemia   . Hypertension   . Myocardial infarction [facility]) 1994; 2011  . Pneumonia 1946; 2015  . Prostate enlargement 07/2014   observed on CT  . Tobacco abuse   . Ventricular tachycardia ([place])    a. 08/2009 s/p BSX E110 Teligen 100 AICD, ser#: [id number];  b. 08/2008 VT req ATP - detection reprogrammed from 160 to 150. c. EPS and VT ablation by Dr. Lovena Le 12/21/2014     Review of Systems  Constitutional: Negative for chills, fatigue and fever.  HENT: Negative for postnasal drip, rhinorrhea and sinus pressure.   Respiratory: Negative for cough, shortness of breath and wheezing.   Cardiovascular: Negative for chest pain, palpitations and leg swelling.       Objective:   Physical Exam Vitals:   03/16/17 1400  BP: 126/78  Pulse: 64  SpO2: 95%  Weight: 223 lb 6.4 oz (101.3 kg)  Height: 5\' 11"  (1.803 m)   RA  Gen: well appearing HENT: OP clear, TM's clear, neck supple PULM: CTA B, normal percussion CV: RRR, no mgr, trace edema GI: BS+, soft,  nontender Derm: no cyanosis or rash Psyche: normal mood and affect    Records reviewed from his hospitalization a week ago for ventricular tachycardia.  PFT: 10/2013 PFT> ratio 61%, FEV1 2.53L (77% pred, 15% change), TLC 8.65 L (117% pred), RV 4.25L (161% pred), DLCO 25.39 (73% pred)  Procedure: Bronchoscopy 01/09/2016 normal airways  Imaging: July 2017 CT chest images personally reviewed showing mild to moderate centrilobular emphysema, no obvious pulmonary parenchymal mass. February 2018 chest x-ray images independently reviewed showing emphysema, pacemaker in place, mild cardiomegaly, no other pulmonary parenchymal abnormalities.    Assessment & Plan:   No diagnosis found.  Discussion: This has been a stable interval for Mr. Hilleary.  He quit smoking which is fantastic.  He has been having a slight increase in  chest congestion and mucus but his lungs are clear to auscultation today.  Because of cost he is interested in consolidating his medicines.  We will try to make a change tetralogy 1 puff daily.  He needs to use a flutter valve more frequently to help loosen up mucus as well as Mucinex twice a day.  We will check his O2 saturation while walking today.  COPD: Stop Brio, stop Spiriva Take Trelegy 1 puff daily  Chest congestion: Take Mucinex twice a day Use the flutter valve 4-5 breaths, 4-5 times a day  Shortness of breath: Exercise regularly We will check your oxygen level when you are walking today  We will see you back in 6 months or sooner if needed  > 18 minutes spent with patient, 27 minute visit  Updated Medication List Outpatient Encounter Medications as of 03/16/2017  Medication Sig  . albuterol (PROVENTIL) (2.5 MG/3ML) 0.083% nebulizer solution Take 3 mLs (2.5 mg total) by nebulization every 6 (six) hours as needed for wheezing or shortness of breath.  [name] amiodarone (PACERONE) 200 MG tablet Take 1 tablet twice daily for 2 weeks then 1 tablet daily.  [name] atorvastatin (LIPITOR) 80 MG tablet Take 1 tablet (80 mg total) by mouth daily.  . benazepril (LOTENSIN) 10 MG tablet Take 0.5 tablets (5 mg total) by mouth daily.  [name] buPROPion (WELLBUTRIN) 75 MG tablet TAKE 1 TABLET(75 MG) BY MOUTH DAILY. NEED OFFICE VISIT.  [name] busPIRone (BUSPAR) 15 MG tablet take 1 tablet by mouth three times a day  . carvedilol (COREG) 6.25 MG tablet Take 1 tablet (6.25 mg total) by mouth 2 (two) times daily.  . cetirizine (ZYRTEC) 10 MG tablet Take 10 mg by mouth daily as needed for allergies.  . Choline Fenofibrate (FENOFIBRIC ACID) 135 MG CPDR Take 1 capsule by mouth daily.  [name] escitalopram (LEXAPRO) 20 MG tablet Take 1 tablet (20 mg total) by mouth daily. Need office visit for further refills  . fluticasone (FLONASE) 50 MCG/ACT nasal spray Place 2 sprays into both nostrils daily.  . fluticasone  furoate-vilanterol (BREO ELLIPTA) 100-25 MCG/INH AEPB Inhale 1 puff into the lungs daily.  . furosemide (LASIX) 40 MG tablet take 1/2 tablet by mouth once daily  . gabapentin (NEURONTIN) 100 MG capsule TAKE 3 CAPSULES BY MOUTH TWICE DAILY  . Ipratropium-Albuterol (COMBIVENT RESPIMAT) 20-100 MCG/ACT AERS respimat Inhale 1 puff into the lungs every 6 (six) hours. Shortness of breath or wheezing  . mexiletine (MEXITIL) 200 MG capsule Take 1 capsule (200 mg total) 3 (three) times daily by mouth.  . nitroGLYCERIN (NITROSTAT) 0.4 MG SL tablet Place 1 tablet (0.4 mg total) under the tongue every 5 (five) minutes x 3 doses as needed for chest  pain.  . omeprazole (PRILOSEC) 20 MG capsule Take 20 mg by mouth 2 (two) times daily.  [name] SPIRIVA HANDIHALER 18 MCG inhalation capsule INHALE THE CONTENTS OF ONE CAPSULE IN THE HANDIHALER ONCE DAILY  . tamsulosin (FLOMAX) 0.4 MG CAPS capsule TAKE 1 CAPSULE BY MOUTH EVERY DAY AFTER SUPPER  . XARELTO 20 MG TABS tablet TAKE 1 TABLET BY MOUTH ONCE DAILY WITH SUPPER  . [DISCONTINUED] amoxicillin-clavulanate (AUGMENTIN) 875-125 MG tablet Take 1 tablet by mouth 2 (two) times daily.   No facility-administered encounter medications on file as of 03/16/2017.

## 2017-03-16 NOTE — Patient Instructions (Addendum)
COPD: Stop Breo, stop Spiriva Take Trelegy 1 puff daily  Chest congestion: Take Mucinex twice a day Use the flutter valve 4-5 breaths, 4-5 times a day  Shortness of breath: Exercise regularly We will check your oxygen level when you are walking today  We will see you back in 6 months or sooner if needed

## 2017-03-27 ENCOUNTER — Other Ambulatory Visit: Payer: Self-pay | Admitting: Internal Medicine

## 2017-03-29 ENCOUNTER — Ambulatory Visit
Admission: RE | Admit: 2017-03-29 | Discharge: 2017-03-29 | Disposition: A | Discharge status: Home / Self Care | Payer: Medicare Other | Source: Ambulatory Visit | Attending: Interventional Cardiology | Admitting: Interventional Cardiology

## 2017-03-29 DIAGNOSIS — Z79899 Other long term (current) drug therapy: Secondary | ICD-10-CM

## 2017-03-29 DIAGNOSIS — J9811 Atelectasis: Secondary | ICD-10-CM | POA: Diagnosis not present

## 2017-03-29 DIAGNOSIS — I2510 Atherosclerotic heart disease of native coronary artery without angina pectoris: Secondary | ICD-10-CM

## 2017-03-29 DIAGNOSIS — I480 Paroxysmal atrial fibrillation: Secondary | ICD-10-CM

## 2017-03-31 ENCOUNTER — Ambulatory Visit (INDEPENDENT_AMBULATORY_CARE_PROVIDER_SITE_OTHER): Payer: Medicare Other | Admitting: *Deleted

## 2017-03-31 ENCOUNTER — Telehealth: Payer: Self-pay | Admitting: Internal Medicine

## 2017-03-31 DIAGNOSIS — I472 Ventricular tachycardia, unspecified: Secondary | ICD-10-CM

## 2017-03-31 NOTE — Telephone Encounter (Signed)
Spoke w/ pt and informed him that we received a remote transmission. Pt stated that his BP was a little high and it has him concerned. Informed him that I would have a Device Clinic RN review the remote transmission and call him back. Pt verbalized understanding.

## 2017-03-31 NOTE — Telephone Encounter (Signed)
New Message    1. Has your device fired? no  2. Is you device beeping? No   3. Are you experiencing draining or swelling at device site? no  4. Are you calling to see if we received your device transmission? No   5. Have you passed out? No  Patient states that the device is flashing a white heart. Its continously blinking     Please route to [place]

## 2017-03-31 NOTE — Telephone Encounter (Signed)
Spoke with patient who reported that when he woke up this morning his home monitor prompted him to send a remote transmission. I reported that we successfully received his transmission which was in normal limits for him. Patient reported his blood pressure being unusually high for him this morning. I will route to triage for further assistance with his blood pressure. Patient verbalized understanding.

## 2017-03-31 NOTE — Telephone Encounter (Signed)
Pt has checked BP several times this morning.  Readings as follows:  160/80 175/80 159/80  Pt states when he woke up this morning his home remote device system was lit up for him to send in an unscheduled remote check.  Per device team, remote check was normal.  Pt states this had made him a little anxious as he was surprised to see it lit up.  Pt states BP normally low.  Denies any HTN sx.  Advised pt to continue monitoring BP, preferably 2 hrs after Benazepril and let us know if BP remains elevated over the next several days.  Pt verbalized understanding and was in agreement with this plan.

## 2017-04-01 NOTE — Progress Notes (Signed)
Remote ICD transmission.   

## 2017-04-02 ENCOUNTER — Encounter: Payer: Self-pay | Admitting: Cardiology

## 2017-04-02 ENCOUNTER — Encounter: Payer: Self-pay | Admitting: Internal Medicine

## 2017-04-02 ENCOUNTER — Other Ambulatory Visit: Payer: Self-pay | Admitting: Internal Medicine

## 2017-04-02 MED ORDER — NITROGLYCERIN 0.4 MG SL SUBL: 0.4000 mg | SUBLINGUAL_TABLET | SUBLINGUAL | 6 refills | Status: DC | PRN

## 2017-04-02 NOTE — Telephone Encounter (Signed)
Pt's medication was sent to pt's pharmacy as requested. Confirmation received.  °

## 2017-04-04 DIAGNOSIS — Y92009 Unspecified place in unspecified non-institutional (private) residence as the place of occurrence of the external cause: Secondary | ICD-10-CM | POA: Diagnosis not present

## 2017-04-04 DIAGNOSIS — S8011XA Contusion of right lower leg, initial encounter: Secondary | ICD-10-CM | POA: Diagnosis not present

## 2017-04-04 DIAGNOSIS — Z9229 Personal history of other drug therapy: Secondary | ICD-10-CM | POA: Diagnosis not present

## 2017-04-04 DIAGNOSIS — Y999 Unspecified external cause status: Secondary | ICD-10-CM | POA: Diagnosis not present

## 2017-04-04 DIAGNOSIS — W01198A Fall on same level from slipping, tripping and stumbling with subsequent striking against other object, initial encounter: Secondary | ICD-10-CM | POA: Diagnosis not present

## 2017-04-04 DIAGNOSIS — S51802A Unspecified open wound of left forearm, initial encounter: Secondary | ICD-10-CM | POA: Diagnosis not present

## 2017-04-04 DIAGNOSIS — T148XXA Other injury of unspecified body region, initial encounter: Secondary | ICD-10-CM | POA: Diagnosis not present

## 2017-04-04 DIAGNOSIS — S8991XA Unspecified injury of right lower leg, initial encounter: Secondary | ICD-10-CM | POA: Diagnosis not present

## 2017-04-04 DIAGNOSIS — S51801A Unspecified open wound of right forearm, initial encounter: Secondary | ICD-10-CM | POA: Diagnosis not present

## 2017-04-04 DIAGNOSIS — S5012XA Contusion of left forearm, initial encounter: Secondary | ICD-10-CM | POA: Diagnosis not present

## 2017-04-04 DIAGNOSIS — Z7901 Long term (current) use of anticoagulants: Secondary | ICD-10-CM | POA: Diagnosis not present

## 2017-04-04 DIAGNOSIS — S0990XA Unspecified injury of head, initial encounter: Secondary | ICD-10-CM | POA: Diagnosis not present

## 2017-04-04 DIAGNOSIS — S40022A Contusion of left upper arm, initial encounter: Secondary | ICD-10-CM | POA: Diagnosis not present

## 2017-04-08 ENCOUNTER — Ambulatory Visit: Payer: Self-pay

## 2017-04-08 NOTE — Telephone Encounter (Signed)
Patient called for c/o "leg swelling." He reports "I fell on Saturday and had a cut on my leg. I went to the ED on Sunday and was evaluated. It was swollen when I went, but the swelling has gotten worse since then." He says the swelling extends from his right knee to the injured area right above the inside of his ankle. He says the bandage is covering the cut area and describes the color of the leg as slightly bruised looking. He says the leg is warm to touch, states "the ED doctor gave me a prescription for an antibiotic, but I haven't gotten it filled." I asked did he have a fever or had a fever anytime this week, he denies. He says the pain is really bad when he first stands, but then decreases to a 2 and he is able to bear weight and walk. According to protocol, see PCP within 24 hours, appointment made for 04/09/17, care advice given, patient verbalized understanding.   Reason for Disposition . [1] Large swelling or bruise AND [2] size > palm of person's hand  Answer Assessment - Initial Assessment Questions 1. MECHANISM: "How did the injury happen?" (e.g., twisting injury, direct blow)      Fell on Saturday, hit right leg on metal storage rack 2. ONSET: "When did the injury happen?" (Minutes or hours ago)      Saturday, 04/03/17 3. LOCATION: "Where is the injury located?"      Right lower leg 4. APPEARANCE of INJURY: "What does the injury look like?"  (e.g., deformity of leg)     Bandage covering 5. SEVERITY: "Can you put weight on that leg?" "Can you walk?"      Yes 6. SIZE: For cuts, bruises, or swelling, ask: "How large is it?" (e.g., inches or centimeters)      Swelling-entire leg from knee down to injury area above ankle 7. PAIN: "Is there pain?" If so, ask: "How bad is the pain?"  (Scale 1-10; or mild, moderate, severe)     2 8. TETANUS: For any breaks in the skin, ask: "When was the last tetanus booster?"     Within last 10 years 9. OTHER SYMPTOMS: "Do you have any other symptoms?"       Warm to touch, no fever, bruised 10. PREGNANCY: "Is there any chance you are pregnant?" "When was your last menstrual period?"       N/A  Protocols used: LEG INJURY-A-AH

## 2017-04-09 ENCOUNTER — Ambulatory Visit (INDEPENDENT_AMBULATORY_CARE_PROVIDER_SITE_OTHER): Payer: Medicare Other | Admitting: Internal Medicine

## 2017-04-09 ENCOUNTER — Encounter: Payer: Self-pay | Admitting: Internal Medicine

## 2017-04-09 DIAGNOSIS — M79604 Pain in right leg: Secondary | ICD-10-CM | POA: Diagnosis not present

## 2017-04-09 DIAGNOSIS — M79602 Pain in left arm: Secondary | ICD-10-CM | POA: Insufficient documentation

## 2017-04-09 DIAGNOSIS — M25571 Pain in right ankle and joints of right foot: Secondary | ICD-10-CM

## 2017-04-09 DIAGNOSIS — M79605 Pain in left leg: Secondary | ICD-10-CM | POA: Insufficient documentation

## 2017-04-09 NOTE — Assessment & Plan Note (Signed)
Skin tear examined without signs of infection. Can use ice for pain. Keep covered. Not bleeding on exam today.

## 2017-04-09 NOTE — Progress Notes (Signed)
   Subjective:    Patient ID: Steven Guzman, male    DOB: [birth year], 77 y.o.   MRN: [id number]  HPI The patient is a 77 YO man coming in for right leg pain and swelling. He fell at home about 1 week ago and hit his head and left arm and right leg. He did seek care and had CT head and x-ray leg without acute findings. Given an antibiotic for prophylaxis which he did not fill. He lost the rx but has found it in the last day and wants to know if he still needs that. Hit metal shelf and tetanus last 2016. He has had swelling in the right leg since the fall along with pain, he has not taken anything for this. He denies propping the legs or using ice. Left arm is fine. Denies headaches or confusion. Denies recurrent falls. Some mild dizziness after the fall for a day or so but resolved now. Walking normally and no weakness. A lot more pain in the right ankle as well since the fall which resolves in a few steps.   Review of Systems  Constitutional: Positive for activity change. Negative for appetite change, fatigue, fever and unexpected weight change.  Respiratory: Negative.   Cardiovascular: Negative.   Musculoskeletal: Positive for arthralgias and myalgias. Negative for back pain, gait problem, joint swelling, neck pain and neck stiffness.  Skin: Positive for color change and wound. Negative for pallor and rash.  Neurological: Negative for syncope, weakness and numbness.      Objective:   Physical Exam  Constitutional: He is oriented to person, place, and time. He appears well-developed and well-nourished.  HENT:  Head: Normocephalic and atraumatic.  Eyes: Conjunctivae and EOM are normal. Pupils are equal, round, and reactive to light.  Neck: Normal range of motion.  Cardiovascular: Normal rate and regular rhythm.  Pulmonary/Chest: Effort normal and breath sounds normal. No respiratory distress. He has no wheezes. He has no rales.  Abdominal: Soft. Bowel sounds are normal. He exhibits no  distension. There is no tenderness. There is no rebound.  Musculoskeletal: He exhibits no edema.  Pain with palpation over the right calf and wound, left arm without much tenderness  Neurological: He is alert and oriented to person, place, and time. Coordination normal.  Skin: Skin is warm and dry.  2 skin tears on the left upper arm about 61mm and 39mm length without signs of infection or bleeding, 43mm circular wound and 7 mm circular wound on the right calf medially without signs of infection. Extensive bruising on the right calf.   Psychiatric: He has a normal mood and affect.   Vitals:   04/09/17 1106  BP: 116/70  Pulse: (!) 56  Temp: 98.2 F (36.8 C)  TempSrc: Oral  SpO2: 98%  Weight: 231 lb (104.8 kg)  Height: 5\' 11"  (1.803 m)      Assessment & Plan:

## 2017-04-09 NOTE — Assessment & Plan Note (Signed)
No signs of fracture in the ER and no direct injury. Suspect reactive arthritis with improvement with walking and worsening after inactivity. Encouraged activity and stretching to help. Tylenol prn for pain if needed.

## 2017-04-09 NOTE — Assessment & Plan Note (Signed)
On exam no signs of infection. Some swelling in the injury and extensive bruising to the calf muscle. Some pain with palpation but no DVT suspected due to on xarelto without missing doses. Wound rewrapped and advised icing and propping the leg for swelling. No muscle tear suspect on exam or tendon tear.

## 2017-04-09 NOTE — Patient Instructions (Addendum)
I would use ice on the leg to help with the swelling as well as keeping it propped up.   Do not take the antibiotic unless the leg starts looking red around the wound. Call us if this happens.   The swelling can take 1-2 weeks to go away and the pain may take up to 4 weeks to go away altogether.   RICE for Routine Care of Injuries Many injuries can be cared for using rest, ice, compression, and elevation (RICE therapy). Using RICE therapy can help to lessen pain and swelling. It can help your body to heal. Rest Reduce your normal activities and avoid using the injured part of your body. You can go back to your normal activities when you feel okay and your doctor says it is okay. Ice Do not put ice on your bare skin.  Put ice in a plastic bag.  Place a towel between your skin and the bag.  Leave the ice on for 20 minutes, 2-3 times a day.  Do this for as long as told by your doctor. Compression Compression means putting pressure on the injured area. This can be done with an elastic bandage. If an elastic bandage has been applied:  Remove and reapply the bandage every 3-4 hours or as told by your doctor.  Make sure the bandage is not wrapped too tight. Wrap the bandage more loosely if part of your body beyond the bandage is blue, swollen, cold, painful, or loses feeling (numb).  See your doctor if the bandage seems to make your problems worse.  Elevation Elevation means keeping the injured area raised. Raise the injured area above your heart or the center of your chest if you can. When should I get help? You should get help if:  You keep having pain and swelling.  Your symptoms get worse.  Get help right away if: You should get help right away if:  You have sudden bad pain at or below the area of your injury.  You have redness or more swelling around your injury.  You have tingling or numbness at or below the injury that does not go away when you take off the  bandage.  This information is not intended to replace advice given to you by your health care provider. Make sure you discuss any questions you have with your health care provider. Document Released: 09/02/2007 Document Revised: 02/11/2016 Document Reviewed: 02/21/2014 Elsevier Interactive Patient Education  2017 [employer].

## 2017-04-15 ENCOUNTER — Telehealth: Payer: Self-pay | Admitting: Pulmonary Disease

## 2017-04-15 LAB — CUP PACEART REMOTE DEVICE CHECK
Battery Remaining Longevity: 54 mo
Battery Remaining Percentage: 65 %
Brady Statistic RV Percent Paced: 0 %
HIGH POWER IMPEDANCE MEASURED VALUE: 49 Ohm
Implantable Lead Implant Date: 20110610
Implantable Lead Implant Date: 20110610
Implantable Lead Location: 753860
Implantable Lead Model: 4135
Implantable Lead Serial Number: 28681386
Lead Channel Impedance Value: 479 Ohm
Lead Channel Pacing Threshold Amplitude: 0.9 V
Lead Channel Pacing Threshold Amplitude: 1.1 V
Lead Channel Pacing Threshold Pulse Width: 0.4 ms
Lead Channel Setting Pacing Amplitude: 2 V
Lead Channel Setting Pacing Pulse Width: 0.4 ms
Lead Channel Setting Sensing Sensitivity: 0.6 mV
MDC IDC LEAD LOCATION: 753859
MDC IDC LEAD SERIAL: 339643
MDC IDC MSMT LEADCHNL RA IMPEDANCE VALUE: 636 Ohm
MDC IDC MSMT LEADCHNL RV PACING THRESHOLD PULSEWIDTH: 0.4 ms
MDC IDC PG IMPLANT DT: 20110610
MDC IDC SESS DTM: 20190102131800
MDC IDC SET LEADCHNL RA PACING AMPLITUDE: 2 V
MDC IDC STAT BRADY RA PERCENT PACED: 18 %
Pulse Gen Serial Number: 164892

## 2017-04-15 MED ORDER — FLUTICASONE-UMECLIDIN-VILANT 100-62.5-25 MCG/INH IN AEPB: 1.0000 | INHALATION_SPRAY | Freq: Every day | RESPIRATORY_TRACT | 5 refills | Status: DC

## 2017-04-15 NOTE — Telephone Encounter (Signed)
Pt did well on trelegy samples, requesting rx to be sent to pharmacy.  This has been sent.  Nothing further needed.

## 2017-04-23 ENCOUNTER — Ambulatory Visit (INDEPENDENT_AMBULATORY_CARE_PROVIDER_SITE_OTHER)
Admission: RE | Admit: 2017-04-23 | Discharge: 2017-04-23 | Disposition: A | Discharge status: Home / Self Care | Payer: Medicare Other | Source: Ambulatory Visit | Attending: Internal Medicine | Admitting: Internal Medicine

## 2017-04-23 ENCOUNTER — Encounter: Payer: Self-pay | Admitting: Internal Medicine

## 2017-04-23 ENCOUNTER — Ambulatory Visit (INDEPENDENT_AMBULATORY_CARE_PROVIDER_SITE_OTHER): Payer: Medicare Other | Admitting: Internal Medicine

## 2017-04-23 VITALS — BP 110/70 | HR 54 | Temp 98.1°F | Ht 71.0 in | Wt 229.0 lb

## 2017-04-23 DIAGNOSIS — M25571 Pain in right ankle and joints of right foot: Secondary | ICD-10-CM

## 2017-04-23 DIAGNOSIS — M79604 Pain in right leg: Secondary | ICD-10-CM | POA: Diagnosis not present

## 2017-04-23 DIAGNOSIS — M7989 Other specified soft tissue disorders: Secondary | ICD-10-CM | POA: Diagnosis not present

## 2017-04-23 DIAGNOSIS — I480 Paroxysmal atrial fibrillation: Secondary | ICD-10-CM | POA: Diagnosis not present

## 2017-04-23 NOTE — Assessment & Plan Note (Signed)
Pain is improved, there is still some swelling which is not unexpected in this situation. X-ray of the ankle today to rule out occult fracture.

## 2017-04-23 NOTE — Patient Instructions (Signed)
We will check the x-ray of the ankle today and have ordered the ultrasound of the leg.   I would recommend to start using the compression stocking for the right leg during the day.

## 2017-04-23 NOTE — Assessment & Plan Note (Signed)
Checking x-ray right ankle for ongoing pain and swelling 3 weeks post fall. Checking US venous to rule out DVT although we talked again about how this is very unlikely given that he has not missed xarelto dosing.

## 2017-04-23 NOTE — Progress Notes (Signed)
   Subjective:    Patient ID: Steven Guzman, male    DOB: Oct 29, 1940, 77 y.o.   MRN: [id number]  HPI The patient is a 77 YO man coming in for right ankle and leg swelling. This is ongoing since fall about 3 weeks ago. His wound on the right shin is mostly covered and healing, no redness or pain. He is having improvement of his right ankle pain. It is just hurting now with swelling. No pain with walking. Walking and standing make the swelling worse. Still taking xarelto and has not missed doses. He is very concerned about a blood clot in the leg however with his history. The swelling does decrease if he can elevate it and better in the morning. Never goes down all the way. Weight is down 2 pounds since last visit  Review of Systems  Constitutional: Positive for activity change. Negative for appetite change, fatigue, fever and unexpected weight change.  HENT: Negative.   Eyes: Negative.   Respiratory: Negative.   Cardiovascular: Positive for leg swelling.  Gastrointestinal: Negative.   Endocrine: Negative.   Musculoskeletal: Positive for arthralgias and myalgias. Negative for back pain, gait problem, joint swelling, neck pain and neck stiffness.  Skin: Negative.   Neurological: Negative for syncope, weakness and numbness.      Objective:   Physical Exam  Constitutional: He is oriented to person, place, and time. He appears well-developed and well-nourished.  HENT:  Head: Normocephalic and atraumatic.  Eyes: EOM are normal.  Neck: Normal range of motion.  Cardiovascular: Normal rate.  Pulmonary/Chest: Effort normal and breath sounds normal. No respiratory distress. He has no wheezes. He has no rales.  Abdominal: Soft. He exhibits no distension. There is no tenderness.  Musculoskeletal: He exhibits edema.  1-2+ edema to the knee right leg, pitting in the ankle and shin, left leg without edema  Neurological: He is alert and oriented to person, place, and time. Coordination normal.    Skin: Skin is warm and dry.  Wound on the right shin with healing, some mild swelling, no redness, good granulation tissue and much smaller compared to prior, extensive bruising on the right calf is gone compared to last visit  Psychiatric: He has a normal mood and affect.   Vitals:   04/23/17 1054  BP: 110/70  Pulse: (!) 54  Temp: 98.1 F (36.7 C)  TempSrc: Oral  SpO2: 98%  Weight: 229 lb (103.9 kg)  Height: 5\' 11"  (1.803 m)      Assessment & Plan:

## 2017-04-23 NOTE — Assessment & Plan Note (Signed)
He will continue on xarelto at this time. He is not having excessive bleeding or bruising.

## 2017-04-27 ENCOUNTER — Telehealth: Payer: Self-pay | Admitting: Internal Medicine

## 2017-04-27 ENCOUNTER — Ambulatory Visit (HOSPITAL_COMMUNITY)
Admission: RE | Admit: 2017-04-27 | Payer: Medicare Other | Source: Ambulatory Visit | Attending: Internal Medicine | Admitting: Internal Medicine

## 2017-04-27 NOTE — Telephone Encounter (Signed)
New message   Patient missed his appt today at 1:30 LE VENOUS (DVT) with [facility]/  and would like to reschedule. Please call

## 2017-05-01 ENCOUNTER — Other Ambulatory Visit: Payer: Self-pay | Admitting: Internal Medicine

## 2017-05-06 ENCOUNTER — Ambulatory Visit (HOSPITAL_COMMUNITY)
Admission: RE | Admit: 2017-05-06 | Discharge: 2017-05-06 | Disposition: A | Discharge status: Home / Self Care | Payer: Medicare Other | Source: Ambulatory Visit | Attending: Cardiovascular Disease | Admitting: Cardiovascular Disease

## 2017-05-06 DIAGNOSIS — M25571 Pain in right ankle and joints of right foot: Secondary | ICD-10-CM | POA: Insufficient documentation

## 2017-05-13 ENCOUNTER — Other Ambulatory Visit: Payer: Self-pay | Admitting: Internal Medicine

## 2017-05-13 ENCOUNTER — Telehealth: Payer: Self-pay | Admitting: Pulmonary Disease

## 2017-05-13 NOTE — Telephone Encounter (Signed)
OK to change to Breo 100 daily and Spiriva 2 puff daily

## 2017-05-13 NOTE — Telephone Encounter (Signed)
Called and spoke with pt. Pt states he picked up trelegy Rx. Pt states Trelegy has a high co pay.  Pt is requesting to switch to Spiriva respimat, as he can get a discount on this medication.  Pt also wants to know if Breo and Spiriva can be taken together.  Preferred pharmacy is [facility] on [address].   BQ please advise. Thanks

## 2017-05-13 NOTE — Telephone Encounter (Signed)
BQ, I just want to clarify, Spiriva respimat 2.5 or 1.25?

## 2017-05-14 MED ORDER — TIOTROPIUM BROMIDE MONOHYDRATE 2.5 MCG/ACT IN AERS: 2.0000 | INHALATION_SPRAY | Freq: Every day | RESPIRATORY_TRACT | 3 refills | Status: DC

## 2017-05-14 MED ORDER — FLUTICASONE FUROATE-VILANTEROL 100-25 MCG/INH IN AEPB: 1.0000 | INHALATION_SPRAY | Freq: Every day | RESPIRATORY_TRACT | 3 refills | Status: DC

## 2017-05-14 NOTE — Telephone Encounter (Signed)
Pt is aware that spiriva 2.5 and Breo 100 has been sent to preferred pharmacy. Nothing further is needed.

## 2017-05-14 NOTE — Telephone Encounter (Signed)
25

## 2017-05-18 ENCOUNTER — Other Ambulatory Visit: Payer: Self-pay | Admitting: Interventional Cardiology

## 2017-05-27 ENCOUNTER — Encounter: Payer: Self-pay | Admitting: Internal Medicine

## 2017-05-31 ENCOUNTER — Telehealth: Payer: Self-pay | Admitting: Internal Medicine

## 2017-05-31 NOTE — Telephone Encounter (Signed)
   Primary Cardiologist: Cristopher Peru, MD  Chart reviewed as part of pre-operative protocol coverage. Given past medical history and time since last visit, based on ACC/AHA guidelines, Steven Guzman would be at acceptable risk for the planned procedure without further cardiovascular testing. He is able to get > 4 mets of activity.   Will route to pharmacy to review anticoagulation.   [place], [place] 05/31/2017, 4:31 PM

## 2017-05-31 NOTE — Telephone Encounter (Signed)
New Message      [facility] Pre-operative Risk Assessment    Request for surgical clearance:  1. What type of surgery is being performed? Dental Extraction   2. When is this surgery scheduled? 06/09/2017   What type of clearance is required (medical clearance vs. Pharmacy clearance to hold med vs. Both)? Both Medical and Pharmacy  3. Are there any medications that need to be held prior to surgery and how long? The need to know if Xarelto will need to be held as well   4. Practice name and name of physician performing surgery? [facility]  Dr. Orland Mustard   5. What is your office phone and fax number? Office [phone] Fax [phone]   6. Anesthesia type (None, local, MAC, general) ? Local     Avaletta L Williams 05/31/2017, 4:02 PM  _________________________________________________________________   (provider comments below)

## 2017-06-01 NOTE — Telephone Encounter (Signed)
Do not need to hold anticoagulation for single dental extraction.

## 2017-06-07 ENCOUNTER — Encounter (HOSPITAL_COMMUNITY): Payer: Self-pay | Admitting: Emergency Medicine

## 2017-06-07 DIAGNOSIS — Z87891 Personal history of nicotine dependence: Secondary | ICD-10-CM | POA: Diagnosis not present

## 2017-06-07 DIAGNOSIS — J449 Chronic obstructive pulmonary disease, unspecified: Secondary | ICD-10-CM | POA: Insufficient documentation

## 2017-06-07 DIAGNOSIS — I5042 Chronic combined systolic (congestive) and diastolic (congestive) heart failure: Secondary | ICD-10-CM | POA: Diagnosis not present

## 2017-06-07 DIAGNOSIS — I110 Hypertensive heart disease with heart failure: Secondary | ICD-10-CM | POA: Insufficient documentation

## 2017-06-07 DIAGNOSIS — I493 Ventricular premature depolarization: Secondary | ICD-10-CM | POA: Diagnosis not present

## 2017-06-07 DIAGNOSIS — Z79899 Other long term (current) drug therapy: Secondary | ICD-10-CM | POA: Diagnosis not present

## 2017-06-07 DIAGNOSIS — R079 Chest pain, unspecified: Secondary | ICD-10-CM | POA: Diagnosis not present

## 2017-06-07 DIAGNOSIS — I472 Ventricular tachycardia: Secondary | ICD-10-CM | POA: Diagnosis not present

## 2017-06-07 DIAGNOSIS — I2510 Atherosclerotic heart disease of native coronary artery without angina pectoris: Secondary | ICD-10-CM | POA: Insufficient documentation

## 2017-06-07 DIAGNOSIS — I5022 Chronic systolic (congestive) heart failure: Secondary | ICD-10-CM | POA: Diagnosis not present

## 2017-06-07 LAB — CBC
HCT: 33.6 % — ABNORMAL LOW (ref 39.0–52.0)
Hemoglobin: 10.4 g/dL — ABNORMAL LOW (ref 13.0–17.0)
MCH: 25.5 pg — ABNORMAL LOW (ref 26.0–34.0)
MCHC: 31 g/dL (ref 30.0–36.0)
MCV: 82.4 fL (ref 78.0–100.0)
PLATELETS: 258 10*3/uL (ref 150–400)
RBC: 4.08 MIL/uL — ABNORMAL LOW (ref 4.22–5.81)
RDW: 17.4 % — AB (ref 11.5–15.5)
WBC: 6.1 10*3/uL (ref 4.0–10.5)

## 2017-06-07 LAB — I-STAT TROPONIN, ED: Troponin i, poc: 0 ng/mL (ref 0.00–0.08)

## 2017-06-07 NOTE — ED Triage Notes (Signed)
Pt c/o chest discomfort and shortness of breath that started this am. Hx afib, AICD. Denies pain/shortness of breath at this time. HR 80 and regular.

## 2017-06-08 ENCOUNTER — Encounter (HOSPITAL_COMMUNITY): Payer: Self-pay | Admitting: Physician Assistant

## 2017-06-08 ENCOUNTER — Emergency Department (HOSPITAL_COMMUNITY): Payer: Medicare Other

## 2017-06-08 ENCOUNTER — Emergency Department (HOSPITAL_COMMUNITY)
Admission: EM | Admit: 2017-06-08 | Discharge: 2017-06-08 | Disposition: A | Discharge status: Home / Self Care | Payer: Medicare Other | Attending: Physician Assistant | Admitting: Physician Assistant

## 2017-06-08 DIAGNOSIS — I2510 Atherosclerotic heart disease of native coronary artery without angina pectoris: Secondary | ICD-10-CM | POA: Diagnosis not present

## 2017-06-08 DIAGNOSIS — I472 Ventricular tachycardia: Secondary | ICD-10-CM | POA: Diagnosis not present

## 2017-06-08 DIAGNOSIS — I5042 Chronic combined systolic (congestive) and diastolic (congestive) heart failure: Secondary | ICD-10-CM | POA: Diagnosis not present

## 2017-06-08 DIAGNOSIS — I493 Ventricular premature depolarization: Secondary | ICD-10-CM | POA: Diagnosis not present

## 2017-06-08 DIAGNOSIS — R079 Chest pain, unspecified: Secondary | ICD-10-CM | POA: Diagnosis not present

## 2017-06-08 HISTORY — DX: Chronic kidney disease, stage 3 (moderate): N18.3

## 2017-06-08 HISTORY — DX: Chronic kidney disease, stage 3 unspecified: N18.30

## 2017-06-08 LAB — BASIC METABOLIC PANEL
ANION GAP: 10 (ref 5–15)
BUN: 12 mg/dL (ref 6–20)
CALCIUM: 9.5 mg/dL (ref 8.9–10.3)
CHLORIDE: 106 mmol/L (ref 101–111)
CO2: 23 mmol/L (ref 22–32)
Creatinine, Ser: 1.29 mg/dL — ABNORMAL HIGH (ref 0.61–1.24)
GFR calc non Af Amer: 51 mL/min — ABNORMAL LOW (ref >60)
GFR, EST AFRICAN AMERICAN: 60 mL/min — AB (ref >60)
GLUCOSE: 108 mg/dL — AB (ref 65–99)
POTASSIUM: 3.7 mmol/L (ref 3.5–5.1)
Sodium: 139 mmol/L (ref 135–145)

## 2017-06-08 LAB — I-STAT TROPONIN, ED: Troponin i, poc: 0.01 ng/mL (ref 0.00–0.08)

## 2017-06-08 LAB — MAGNESIUM: MAGNESIUM: 1.8 mg/dL (ref 1.7–2.4)

## 2017-06-08 MED ORDER — POTASSIUM CHLORIDE CRYS ER 20 MEQ PO TBCR
20.0000 meq | EXTENDED_RELEASE_TABLET | Freq: Every day | ORAL | Status: DC
  Administered 2017-06-08: 20 meq via ORAL
  Filled 2017-06-08: qty 1

## 2017-06-08 MED ORDER — MAGNESIUM OXIDE 400 (241.3 MG) MG PO TABS
400.0000 mg | ORAL_TABLET | Freq: Once | ORAL | Status: AC
  Administered 2017-06-08: 400 mg via ORAL
  Filled 2017-06-08 (×2): qty 1

## 2017-06-08 MED ORDER — MAGNESIUM OXIDE 400 MG PO CAPS: 400.0000 mg | ORAL_CAPSULE | Freq: Every day | ORAL | 3 refills | Status: DC

## 2017-06-08 MED ORDER — POTASSIUM CHLORIDE CRYS ER 20 MEQ PO TBCR: 20.0000 meq | EXTENDED_RELEASE_TABLET | Freq: Every day | ORAL | 3 refills | Status: DC

## 2017-06-08 NOTE — ED Provider Notes (Signed)
[facility] EMERGENCY DEPARTMENT Provider Note   CSN: [id number] Arrival date & time: 06/07/17  2327     History   Chief Complaint Chief Complaint  Patient presents with  . Chest Pain    HPI Steven Guzman is a 77 y.o. male.  The history is provided by the patient and medical records. No language interpreter was used.  Chest Pain   Associated symptoms include shortness of breath. Pertinent negatives include no cough.   Steven Guzman is a 77 y.o. male  with a PMH of CAD with prior stent placement, AICD in place, CHF, COPD who presents to the Emergency Department complaining of intermittent chest discomfort beginning around 6 PM last night.  Patient states his "heart felt nervous", similar to experience with atrial fibrillation.  He also notes chest soreness and shortness of breath associated with this.  He states this lasted about 30 minutes and then resolved. He did check his blood pressure (and heart rate) at this time. He reports heart rate was in the 90's. Later, his symptoms returned and he checked this again. He reports HR was in the 40's. He also experienced shortness of breath at that time. He took one of his home mexitil and went to bed. He awoke with similar symptoms and decided to come to ER for further evaluation. No nausea, vomiting, diaphoresis, abdominal pain.  Denies radiation of chest soreness to neck or extremities.  He is on Xarelto and baby aspirin.  Last took baby aspirin yesterday morning. Currently not experiencing any difficulty breathing.  No recent illness, cough, congestion or fevers. While in the waiting room, he started feeling an odd sensation in his chest.  He also reports associated dizziness at this time.  Vitals were taken in the waiting room and heart rate was noted in the 40s.  Past Medical History:  Diagnosis Date  . AICD (automatic cardioverter/defibrillator) present   . Aneurysm ([place])    a. Aneurysmal infrarenal aorta up to 33  mm on CT 10/2014, recommended f/u due 10/2017  . Anginal pain ([place])   . Anxiety   . Basal cell carcinoma of nose    S/P MOHS  . Biliary acute pancreatitis   . CAD (coronary artery disease)    a. s/p MI in 1994 with PCI to LAD at that time b. cath 10/2012 demonstrated EF 30%, inferior akinesis with mild hypokinesis of all walls, patent LAD and RCA stents; ostial PDA with 80-90% obstruction with medical therapy recommended   . Chronic systolic CHF (congestive heart failure) (H[facility])    EF 30 to 35 % as of 09/2014.   [name] CKD (chronic kidney disease), stage III ([place])   . Complication of anesthesia 10/2014   "had to have defibrillator w/ERCP"  . COPD (chronic obstructive pulmonary disease) ([place])    a. followed by pulmonary, COPD GOLD stage II  . Depression   . Diverticulosis of colon 07/2014   noted on CT  . GERD (gastroesophageal reflux disease)   . Hiatal hernia   . Hyperglycemia 10/2012.  [name] Hyperlipidemia   . Hypertension   . Myocardial infarction [facility]) 1994; 2011  . Pneumonia 1946; 2015  . Prostate enlargement 07/2014   observed on CT  . Tobacco abuse   . Ventricular tachycardia ([place])    a. 08/2009 s/p BSX E110 Teligen 100 AICD, ser#: [id number];  b. 08/2008 VT req ATP - detection reprogrammed from 160 to 150. c. EPS and VT ablation by Dr. Lovena Le 12/21/2014  Patient Active Problem List   Diagnosis Date Noted  . Right leg pain 04/09/2017  . Right ankle pain 04/09/2017  . Left arm pain 04/09/2017  . BPH (benign prostatic hyperplasia) 12/31/2016  . Arrhythmia 09/15/2016  . Dizzinesses 05/22/2016  . Pancreatitis, chronic ([place]) 08/02/2015  . Mass of throat 06/30/2015  . Depression 05/24/2015  . Atrial fibrillation ([place]) 03/15/2015  . Routine general medical examination at a health care facility 05/12/2014  . Hemoptysis 03/16/2014  . COPD GOLD GRADE C 12/25/2013  . Chronic systolic heart failure ([place]) 07/07/2013  . CAD (coronary artery disease) 11/23/2012  . Cigarette smoker 11/23/2012  .  Ventricular tachycardia ([place]) 11/23/2012  . Essential hypertension 11/23/2012  . Hyperlipidemia 11/23/2012  . ICD (implantable cardioverter-defibrillator) in place 11/23/2012    Past Surgical History:  Procedure Laterality Date  . CATARACT EXTRACTION W/ INTRAOCULAR LENS  IMPLANT, BILATERAL Bilateral ~ 2011  . COLONOSCOPY    . ELECTROPHYSIOLOGIC STUDY N/A 12/21/2014   Procedure: V Tach Ablation;  Surgeon: [name], MD;  Location: [facility];  Service: Cardiovascular;  Laterality: N/A;  . ERCP N/A 11/16/2014   Procedure: ENDOSCOPIC RETROGRADE CHOLANGIOPANCREATOGRAPHY (ERCP);  Surgeon: [name], MD;  Location: [facility];  Service: Endoscopy;  Laterality: N/A;  . EYE SURGERY    . FOOT SURGERY Left 2005   "fixed bone that stuck out in my ankle area"  . HEMORRHOID BANDING    . IMPLANTABLE CARDIOVERTER DEFIBRILLATOR IMPLANT  09/06/09   BSX dual chamber ICD implanted in [place] for cardiac arrest 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  . INGUINAL HERNIA REPAIR Right ~ 1995  . LEFT HEART CATHETERIZATION WITH CORONARY ANGIOGRAM N/A 11/25/2012   demonstrated EF 30%, inferior akinesis with mild hypokinesis of all walls, patent LAD and RCA stents; ostial PDA with 80-90% obstruction with medical therapy recommended  . MOHS SURGERY  2008   nose, skin graft  . RETINAL DETACHMENT SURGERY Right 2013  . TENOLYSIS Right 12/21/2013   Procedure: TENOLYSIS FLEXOR CARPI RADIALIS ,DEBRIDEMENT RIGHT JOINT WRIST,DEBRIDEMENT SCAPHOTRAPEZIAL TRAPEZOID, REPAIR OF EXTENSOR HOOD;  Surgeon: Daryll Brod, MD;  Location: [facility];  Service: Orthopedics;  Laterality: Right;  . V-TACH ABLATION  12/21/2014  . VIDEO BRONCHOSCOPY Bilateral 01/09/2016   Procedure: VIDEO BRONCHOSCOPY WITHOUT FLUORO;  Surgeon: Juanito Doom, MD;  Location: [facility];  Service: Cardiopulmonary;  Laterality: Bilateral;       Home Medications    Prior to Admission medications   Medication Sig Start Date End Date  Taking? Authorizing Provider  albuterol (PROVENTIL) (2.5 MG/3ML) 0.083% nebulizer solution Take 3 mLs (2.5 mg total) by nebulization every 6 (six) hours as needed for wheezing or shortness of breath. 05/22/16   Juanito Doom, MD  amiodarone (PACERONE) 200 MG tablet Take 1 tablet twice daily for 2 weeks then 1 tablet daily. 09/15/16   Chanetta Marshall K, NP  atorvastatin (LIPITOR) 80 MG tablet Take 1 tablet (80 mg total) by mouth daily. 11/13/16   [name], MD  benazepril (LOTENSIN) 10 MG tablet Take 0.5 tablets (5 mg total) by mouth daily. 12/07/16   [name], MD  buPROPion (WELLBUTRIN) 75 MG tablet TAKE 1 TABLET(75 MG) BY MOUTH DAILY. NEED OFFICE VISIT. 03/15/17   Hoyt Koch, MD  busPIRone (BUSPAR) 15 MG tablet TAKE 1 TABLET BY MOUTH THREE TIMES A DAY 05/13/17   Hoyt Koch, MD  carvedilol (COREG) 6.25 MG tablet Take 1 tablet (6.25 mg total) by mouth 2 (two)  times daily. 09/18/16   [name], MD  cetirizine (ZYRTEC) 10 MG tablet Take 10 mg by mouth daily as needed for allergies.    [provider]  Choline Fenofibrate (FENOFIBRIC ACID) 135 MG CPDR TAKE 1 CAPSULE BY MOUTH DAILY 05/18/17   Belva Crome, MD  escitalopram (LEXAPRO) 20 MG tablet Take 1 tablet (20 mg total) by mouth daily. Need office visit for further refills 12/21/16   Hoyt Koch, MD  fluticasone [facility]) 50 MCG/ACT nasal spray Place 2 sprays into both nostrils daily.    [provider]  fluticasone furoate-vilanterol (BREO ELLIPTA) 100-25 MCG/INH AEPB Inhale 1 puff into the lungs daily. 05/14/17   Juanito Doom, MD  Fluticasone-Umeclidin-Vilant (TRELEGY ELLIPTA) 100-62.5-25 MCG/INH AEPB Inhale 1 puff into the lungs daily. 04/15/17   Juanito Doom, MD  furosemide (LASIX) 40 MG tablet take 1/2 tablet by mouth once daily 07/13/16   Belva Crome, MD  gabapentin (NEURONTIN) 100 MG capsule TAKE 3 CAPSULES BY MOUTH TWICE DAILY 03/29/17   Hoyt Koch, MD    Ipratropium-Albuterol (COMBIVENT RESPIMAT) 20-100 MCG/ACT AERS respimat Inhale 1 puff into the lungs every 6 (six) hours. Shortness of breath or wheezing 01/28/17   Juanito Doom, MD  Magnesium Oxide 400 MG CAPS Take 1 capsule (400 mg total) by mouth daily. 06/08/17   Dunn, Nedra Hai, PA-C  mexiletine (MEXITIL) 200 MG capsule Take 1 capsule (200 mg total) 3 (three) times daily by mouth. 02/08/17   [name], MD  nitroGLYCERIN (NITROSTAT) 0.4 MG SL tablet Place 1 tablet (0.4 mg total) under the tongue every 5 (five) minutes x 3 doses as needed for chest pain. 04/02/17   [name], MD  omeprazole (PRILOSEC) 20 MG capsule Take 20 mg by mouth 2 (two) times daily.    [provider]  potassium chloride SA (K-DUR,KLOR-CON) 20 MEQ tablet Take 1 tablet (20 mEq total) by mouth daily. 06/08/17   Charlie Pitter, PA-C  Respiratory Therapy Supplies (FLUTTER) DEVI Use as directed 03/16/17   Juanito Doom, MD  tamsulosin (FLOMAX) 0.4 MG CAPS capsule TAKE 1 CAPSULE BY MOUTH EVERY DAY AFTER SUPPER 05/03/17   Hoyt Koch, MD  Tiotropium Bromide Monohydrate (SPIRIVA RESPIMAT) 2.5 MCG/ACT AERS Inhale 2 puffs into the lungs daily. 05/14/17   Juanito Doom, MD  XARELTO 20 MG TABS tablet TAKE 1 TABLET BY MOUTH ONCE DAILY WITH SUPPER 11/25/16   [name], MD    Family History Family History  Problem Relation Age of Onset  . Heart attack Brother   . CAD Father   . Hypertension Father   . CAD Mother   . Hypertension Mother   . Hypertension Brother   . Stroke Neg Hx     Social History Social History   Tobacco Use  . Smoking status: Former Smoker    Packs/day: 1.00    Years: 55.00    Pack years: 55.00    Types: Cigarettes    Last attempt to quit: 10/12/2016    Years since quitting: 0.6  . Smokeless tobacco: Never Used  . Tobacco comment: had quit, recently bought a pack  Substance Use Topics  . Alcohol use: Yes    Alcohol/week: 0.0 oz    Comment: 12/21/2014 "might  have a couple glasses of wine/year"  . Drug use: No     Allergies   Sulfa antibiotics and Sulfasalazine   Review of Systems Review of Systems  Respiratory: Positive  for shortness of breath. Negative for cough.   Cardiovascular: Positive for chest pain.  All other systems reviewed and are negative.    Physical Exam Updated Vital Signs BP 110/81   Pulse 66   Temp (!) 97.4 F (36.3 C)   Resp 17   SpO2 97%   Physical Exam  Constitutional: He is oriented to person, place, and time. He appears well-developed and well-nourished. No distress.  HENT:  Head: Normocephalic and atraumatic.  Neck: Neck supple. No JVD present.  Cardiovascular: Normal rate, regular rhythm and normal heart sounds.  No murmur heard. Pulmonary/Chest: Effort normal and breath sounds normal. No respiratory distress.  Abdominal: Soft. He exhibits no distension. There is no tenderness.  Musculoskeletal: He exhibits no edema.  Neurological: He is alert and oriented to person, place, and time.  Skin: Skin is warm and dry.  Nursing note and vitals reviewed.    ED Treatments / Results  Labs (all labs ordered are listed, but only abnormal results are displayed) Labs Reviewed  BASIC METABOLIC PANEL - Abnormal; Notable for the following components:      Result Value   Glucose, Bld 108 (*)    Creatinine, Ser 1.29 (*)    GFR calc non Af Amer 51 (*)    GFR calc Af Amer 60 (*)    All other components within normal limits  CBC - Abnormal; Notable for the following components:   RBC 4.08 (*)    Hemoglobin 10.4 (*)    HCT 33.6 (*)    MCH 25.5 (*)    RDW 17.4 (*)    All other components within normal limits  MAGNESIUM  I-STAT TROPONIN, ED  I-STAT TROPONIN, ED    EKG  EKG Interpretation  Date/Time:  Monday June 07 2017 23:32:03 EDT Ventricular Rate:  84 PR Interval:  156 QRS Duration: 124 QT Interval:  412 QTC Calculation: 486 R Axis:   88 Text Interpretation:  Normal sinus rhythm Possible  Inferior infarct , age undetermined ST & T wave abnormality, consider lateral ischemia Abnormal ECG When compared with ECG of 09/14/2016, STEMI is no longer present Left axis deviation is no longer present Confirmed by Delora Fuel ([id number]) on 06/08/2017 5:28:03 AM Also confirmed by Delora Fuel ([id number]), editor Hattie Perch [phone])  on 06/08/2017 6:46:55 AM       Radiology Dg Chest 2 View  Result Date: 06/08/2017 CLINICAL DATA:  77 year old male with chest pain. EXAM: CHEST - 2 VIEW COMPARISON:  Chest radiograph dated 03/29/2017 FINDINGS: There is emphysematous changes of the lungs. Left lung base atelectatic changes/scarring. No focal consolidation, pleural effusion, or pneumothorax. Stable mild cardiomegaly. Left pectoral AICD device. The aorta is tortuous. There is a small hiatal hernia. IMPRESSION: No active cardiopulmonary disease. Electronically Signed   By: Anner Crete M.D.   On: 06/08/2017 00:34    Procedures Procedures (including critical care time)  Medications Ordered in ED Medications  potassium chloride SA (K-DUR,KLOR-CON) CR tablet 20 mEq (20 mEq Oral Given 06/08/17 0908)  magnesium oxide (MAG-OX) tablet 400 mg (400 mg Oral Given 06/08/17 0908)     Initial Impression / Assessment and Plan / ED Course  I have reviewed the triage vital signs and the nursing notes.  Pertinent labs & imaging results that were available during my care of the patient were reviewed by me and considered in my medical decision making (see chart for details).    Steven Guzman is a 77 y.o. male who presents to ED for intermittent  chest discomfort since last night. EKG with no signs of acute ischemia. CXR negative. Labs reassuring. Trop negative and repeat trop over 7 hours later negative as well. Afebrile, hemodynamically stable with normal cardiopulmonary exam. Will consult cardiology for further recommendations.   8:43 AM -Patient has been evaluated by cardiology, Dr. Sallyanne Kuster. AICD  interrogated by cards as well. Case discussed. Feels as if patient is safe for discharge. Given rx for potassium supplementation. Recommends obtaining magnesium level in ED today. If mag wdl, will start on 400 mag-ox qd. If low, will start on 400mg  mag-ox BID. Outpatient cardiology follow up has been arranged. I appreciate their assistance with patient care in ED today.   [place] wdl. Home care, follow up and return precautions discussed. All questions answered.  Final Clinical Impressions(s) / ED Diagnoses   Final diagnoses:  Symptomatic PVCs    ED Discharge Orders        Ordered    Magnesium Oxide 400 MG CAPS  Daily     06/08/17 0842    potassium chloride SA (K-DUR,KLOR-CON) 20 MEQ tablet  Daily     06/08/17 0842       Courtne Lighty, Ozella Almond, PA-C 06/08/17 1011    [place], Fredia Sorrow, MD 06/09/17 [id number]

## 2017-06-08 NOTE — Consult Note (Addendum)
Cardiology Consultation    Patient ID: Steven Guzman MRN: [id number], DOB: [birth year] Date of Encounter: 06/08/2017, 8:11 AM Primary Physician: Hoyt Koch, MD Primary Cardiologist: Dr. Threasa Beards  Chief Complaint: dizziness Reason for Admission: symptomatic PVCs Requesting MD: Ms. Leonides Schanz PA-C  HPI: Steven Guzman is a 77 y.o. male with history of chronic systolic CHF (last EF 78-67% in 08/2016), CAD with remote MI/ LAD/RCA stenting (last cath 2014 managed medically), ventricular tachycardia s/p AICD 2011, VT ablation 2016, PAF on Xarelto, abdominal aortic aneurysm (next f/u due 10/2017), anxiety, COPD, depression, diverticulosis, GERD, hiatal hernia, HTN, HLD, probable CKD III, prostate enlargement whom we are asked to see for arrhythmia and PVCs by Ms. Ward PA-C  Around 6:30pm yesterday he detected sensation of elevated HR which was in the 90s by his home monitor which he states was unusual for him. He also would notice it would periodically go down to the 40s. He followed his BP which was labile. He had only taken Mexilitine once yesterday so shortly after these sensations started, he took another one. As the HR did not improve, he took a 3rd mexilitine. He otherwise usually takes the mexilitine TID as directed but yesterday was somewhat unusual because he didn't eat lunch and usually takes this med when he eats. He also noticed increased SOB with this, prompting him to come to the ER. He also has had some chest wall soreness. Around 6:30 this morning he had some dizziness. Telemetry monitoring has shown frequent ventricular bigeminy with HR in the 90s (the pickup of "40s" likely reflecting the frequent PVCs). Troponins neg x 2. Hgb 10.4 which is a downtrend from previous values of 12-14. Cr 1.29 compatible with prior. He is currently feeling fine but HR is in the 60s again on telemetry. CXR NAD. Otherwise he's been in his usual state of health recently. Denies any recent sx of  bleeding whatsoever aside from easy bruising.   Past Medical History:  Diagnosis Date  . AICD (automatic cardioverter/defibrillator) present   . Aneurysm ([place])    a. Aneurysmal infrarenal aorta up to 33 mm on CT 10/2014, recommended f/u due 10/2017  . Anginal pain ([place])   . Anxiety   . Basal cell carcinoma of nose    S/P MOHS  . Biliary acute pancreatitis   . CAD (coronary artery disease)    a. s/p MI in 1994 with PCI to LAD at that time b. cath 10/2012 demonstrated EF 30%, inferior akinesis with mild hypokinesis of all walls, patent LAD and RCA stents; ostial PDA with 80-90% obstruction with medical therapy recommended   . Chronic systolic CHF (congestive heart failure) (HCC)    EF 30 to 35 % as of 09/2014.   [name] CKD (chronic kidney disease), stage III ([place])   . Complication of anesthesia 10/2014   "had to have defibrillator w/ERCP"  . COPD (chronic obstructive pulmonary disease) ([place])    a. followed by pulmonary, COPD GOLD stage II  . Depression   . Diverticulosis of colon 07/2014   noted on CT  . GERD (gastroesophageal reflux disease)   . Hiatal hernia   . Hyperglycemia 10/2012.  [name] Hyperlipidemia   . Hypertension   . Myocardial infarction [facility]) 1994; 2011  . Pneumonia 1946; 2015  . Prostate enlargement 07/2014   observed on CT  . Tobacco abuse   . Ventricular tachycardia ([place])    a. 08/2009 s/p BSX E110 Teligen 100 AICD, ser#: [id number];  b. 08/2008 VT req  ATP - detection reprogrammed from 160 to 150. c. EPS and VT ablation by Dr. Lovena Le 12/21/2014     Surgical History:  Past Surgical History:  Procedure Laterality Date  . CATARACT EXTRACTION W/ INTRAOCULAR LENS  IMPLANT, BILATERAL Bilateral ~ 2011  . COLONOSCOPY    . ELECTROPHYSIOLOGIC STUDY N/A 12/21/2014   Procedure: V Tach Ablation;  Surgeon: [name], MD;  Location: [facility];  Service: Cardiovascular;  Laterality: N/A;  . ERCP N/A 11/16/2014   Procedure: ENDOSCOPIC RETROGRADE CHOLANGIOPANCREATOGRAPHY (ERCP);   Surgeon: [name], MD;  Location: [place];  Service: Endoscopy;  Laterality: N/A;  . EYE SURGERY    . FOOT SURGERY Left 2005   "fixed bone that stuck out in my ankle area"  . HEMORRHOID BANDING    . IMPLANTABLE CARDIOVERTER DEFIBRILLATOR IMPLANT  09/06/09   BSX dual chamber ICD implanted in [place] for cardiac arrest 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  . INGUINAL HERNIA REPAIR Right ~ 1995  . LEFT HEART CATHETERIZATION WITH CORONARY ANGIOGRAM N/A 11/25/2012   demonstrated EF 30%, inferior akinesis with mild hypokinesis of all walls, patent LAD and RCA stents; ostial PDA with 80-90% obstruction with medical therapy recommended  . MOHS SURGERY  2008   nose, skin graft  . RETINAL DETACHMENT SURGERY Right 2013  . TENOLYSIS Right 12/21/2013   Procedure: TENOLYSIS FLEXOR CARPI RADIALIS ,DEBRIDEMENT RIGHT JOINT WRIST,DEBRIDEMENT SCAPHOTRAPEZIAL TRAPEZOID, REPAIR OF EXTENSOR HOOD;  Surgeon: Daryll Brod, MD;  Location: [facility];  Service: Orthopedics;  Laterality: Right;  . V-TACH ABLATION  12/21/2014  . VIDEO BRONCHOSCOPY Bilateral 01/09/2016   Procedure: VIDEO BRONCHOSCOPY WITHOUT FLUORO;  Surgeon: Juanito Doom, MD;  Location: [facility];  Service: Cardiopulmonary;  Laterality: Bilateral;     Home Meds: Prior to Admission medications   Medication Sig Start Date End Date Taking? Authorizing Provider  albuterol (PROVENTIL) (2.5 MG/3ML) 0.083% nebulizer solution Take 3 mLs (2.5 mg total) by nebulization every 6 (six) hours as needed for wheezing or shortness of breath. 05/22/16   Juanito Doom, MD  amiodarone (PACERONE) 200 MG tablet Take 1 tablet twice daily for 2 weeks then 1 tablet daily. 09/15/16   Chanetta Marshall K, NP  atorvastatin (LIPITOR) 80 MG tablet Take 1 tablet (80 mg total) by mouth daily. 11/13/16   [name], MD  benazepril (LOTENSIN) 10 MG tablet Take 0.5 tablets (5 mg total) by mouth daily. 12/07/16   [name], MD  buPROPion (WELLBUTRIN) 75 MG  tablet TAKE 1 TABLET(75 MG) BY MOUTH DAILY. NEED OFFICE VISIT. 03/15/17   Hoyt Koch, MD  busPIRone (BUSPAR) 15 MG tablet TAKE 1 TABLET BY MOUTH THREE TIMES A DAY 05/13/17   Hoyt Koch, MD  carvedilol (COREG) 6.25 MG tablet Take 1 tablet (6.25 mg total) by mouth 2 (two) times daily. 09/18/16   [name], MD  cetirizine (ZYRTEC) 10 MG tablet Take 10 mg by mouth daily as needed for allergies.    [provider]  Choline Fenofibrate (FENOFIBRIC ACID) 135 MG CPDR TAKE 1 CAPSULE BY MOUTH DAILY 05/18/17   Belva Crome, MD  escitalopram (LEXAPRO) 20 MG tablet Take 1 tablet (20 mg total) by mouth daily. Need office visit for further refills 12/21/16   Hoyt Koch, MD  fluticasone [facility]) 50 MCG/ACT nasal spray Place 2 sprays into both nostrils daily.    [provider]  fluticasone furoate-vilanterol (BREO ELLIPTA) 100-25 MCG/INH AEPB Inhale 1 puff into the lungs  daily. 05/14/17   Juanito Doom, MD  Fluticasone-Umeclidin-Vilant (TRELEGY ELLIPTA) 100-62.5-25 MCG/INH AEPB Inhale 1 puff into the lungs daily. 04/15/17   Juanito Doom, MD  furosemide (LASIX) 40 MG tablet take 1/2 tablet by mouth once daily 07/13/16   Belva Crome, MD  gabapentin (NEURONTIN) 100 MG capsule TAKE 3 CAPSULES BY MOUTH TWICE DAILY 03/29/17   Hoyt Koch, MD  Ipratropium-Albuterol (COMBIVENT RESPIMAT) 20-100 MCG/ACT AERS respimat Inhale 1 puff into the lungs every 6 (six) hours. Shortness of breath or wheezing 01/28/17   Juanito Doom, MD  mexiletine (MEXITIL) 200 MG capsule Take 1 capsule (200 mg total) 3 (three) times daily by mouth. 02/08/17   [name], MD  nitroGLYCERIN (NITROSTAT) 0.4 MG SL tablet Place 1 tablet (0.4 mg total) under the tongue every 5 (five) minutes x 3 doses as needed for chest pain. 04/02/17   [name], MD  omeprazole (PRILOSEC) 20 MG capsule Take 20 mg by mouth 2 (two) times daily.    [provider]  Respiratory  Therapy Supplies (FLUTTER) DEVI Use as directed 03/16/17   Juanito Doom, MD  tamsulosin (FLOMAX) 0.4 MG CAPS capsule TAKE 1 CAPSULE BY MOUTH EVERY DAY AFTER SUPPER 05/03/17   Hoyt Koch, MD  Tiotropium Bromide Monohydrate (SPIRIVA RESPIMAT) 2.5 MCG/ACT AERS Inhale 2 puffs into the lungs daily. 05/14/17   Juanito Doom, MD  XARELTO 20 MG TABS tablet TAKE 1 TABLET BY MOUTH ONCE DAILY WITH SUPPER 11/25/16   [name], MD    Allergies:  Allergies  Allergen Reactions  . Sulfa Antibiotics Hives  . Sulfasalazine Hives    Social History   Socioeconomic History  . Marital status: Married    Spouse name: Not on file  . Number of children: Not on file  . Years of education: Not on file  . Highest education level: Not on file  Social Needs  . Financial resource strain: Not on file  . Food insecurity - worry: Not on file  . Food insecurity - inability: Not on file  . Transportation needs - medical: Not on file  . Transportation needs - non-medical: Not on file  Occupational History  . Occupation: Retired  Tobacco Use  . Smoking status: Former Smoker    Packs/day: 1.00    Years: 55.00    Pack years: 55.00    Types: Cigarettes    Last attempt to quit: 10/12/2016    Years since quitting: 0.6  . Smokeless tobacco: Never Used  . Tobacco comment: had quit, recently bought a pack  Substance and Sexual Activity  . Alcohol use: Yes    Alcohol/week: 0.0 oz    Comment: 12/21/2014 "might have a couple glasses of wine/year"  . Drug use: No  . Sexual activity: Not Currently  Other Topics Concern  . Not on file  Social History Narrative  . Not on file     Family History  Problem Relation Age of Onset  . Heart attack Brother   . CAD Father   . Hypertension Father   . CAD Mother   . Hypertension Mother   . Hypertension Brother   . Stroke Neg Hx     Review of Systems:recent URI a few weeks ago All other systems reviewed and are otherwise negative except as noted  above.  Labs:   Lab Results  Component Value Date   WBC 6.1 06/07/2017   HGB 10.4 (L) 06/07/2017   HCT 33.6 (L)  06/07/2017   MCV 82.4 06/07/2017   PLT 258 06/07/2017    Recent Labs  Lab 06/07/17 2340  NA 139  K 3.7  CL 106  CO2 23  BUN 12  CREATININE 1.29*  CALCIUM 9.5  GLUCOSE 108*    Lab Results  Component Value Date   CHOL 146 12/29/2016   HDL 52.00 12/29/2016   LDLCALC 77 12/29/2016   TRIG 83.0 12/29/2016   Radiology/Studies:  Dg Chest 2 View  Result Date: 06/08/2017 CLINICAL DATA:  76 year old male with chest pain. EXAM: CHEST - 2 VIEW COMPARISON:  Chest radiograph dated 03/29/2017 FINDINGS: There is emphysematous changes of the lungs. Left lung base atelectatic changes/scarring. No focal consolidation, pleural effusion, or pneumothorax. Stable mild cardiomegaly. Left pectoral AICD device. The aorta is tortuous. There is a small hiatal hernia. IMPRESSION: No active cardiopulmonary disease. Electronically Signed   By: Anner Crete M.D.   On: 06/08/2017 00:34   Wt Readings from Last 3 Encounters:  04/23/17 229 lb (103.9 kg)  04/09/17 231 lb (104.8 kg)  03/16/17 223 lb 6.4 oz (101.3 kg)    EKG: NSR NSIVCD prior inferior infarct with inferiorly and V4-V6 ST-T changes similar to prior.  Physical Exam: Blood pressure 104/82, pulse 92, temperature (!) 97.4 F (36.3 C), resp. rate 18, SpO2 96 %. There is no height or weight on file to calculate BMI. General: Well developed, well nourished, in no acute distress. Head: Normocephalic, atraumatic, sclera non-icteric, no xanthomas, nares are without discharge.  Neck: Negative for carotid bruits. JVD not elevated. Lungs: Mild wheezing noted. Clear bilaterally to auscultation without rales or rhonchi. Breathing is unlabored. Heart: RRR with S1 S2. No murmurs, rubs, or gallops appreciated. Abdomen: Soft, non-tender, non-distended with normoactive bowel sounds. No hepatomegaly. No rebound/guarding. No obvious abdominal  masses. Msk:  Strength and tone appear normal for age. Extremities: No clubbing or cyanosis. No edema.  Distal pedal pulses are 2+ and equal bilaterally. Neuro: Alert and oriented X 3. No focal deficit. No facial asymmetry. Moves all extremities spontaneously. Psych:  Responds to questions appropriately with a normal affect.    Assessment and Plan   1. Symptomatic PVCs 2. History of VT s/p ICD, ablation 3. Chronic systolic CHF 4. CAD s/p prior MI, PCI without evidence for ischemia by troponins 5. COPD with ongoing tobacco abuse 6. H/o PAF, on Xarelto 7. Mild anemia without evidence for bleeding  Device interrogation shows no episodes of VT. Telemetry is consistent with ventricular bigeminy which seems to correlate with his symptoms and likely accounts for his detected HR of 40s (I.e. pseudobradycardia with the PVCs just not picking up on home monitor). He had accidentally skipped his afternoon dose of mexilitine yesterday. His device is actually set to a lower rate of 50 so he should not fall below this. Of note, Hgb 10.4 but he denies any acute bleeding aside from easy bruising. Dose of Xarelto remains appropriate for CrCl. K 3.7. Mag is pending. Per d/w Dr. Sallyanne Kuster, abbreviated plan as below:  - start KCl 28meq daily and MagOx 400mg  daily (first dose in ER) - mag level sent - spoke with ER nurse to let me know when level results - Dr. Sallyanne Kuster reviewed with Tommye Standard to discuss further with EP team and determine plan for EP f/u (Dr. Lovena Le is on vacation) - patient advised to f/u PCP for anemia closely - advised to stay on schedule with antiarrhythics  For questions or updates, please contact [facility] Please consult [email or web address] for  contact info under Cardiology/STEMI.  Signed, Charlie Pitter, PA-C 06/08/2017, 8:11 AM   I have seen and examined the patient along with Melina Copa, PA.  I have reviewed the chart, notes and new data.  I agree with PA's note.  Key new  complaints: symptoms of increased dyspnea and chest fluttering correlate with "bradycardia", which is actually ventricular bigeminy. Key examination changes: no overt signs of CHF - lying flat comfortably, no JVD/edema/S3/rales. Rhythm is currently regular. Scattered wheezes. Key new findings / data: full ICD interrogation performed. No recent sustained arrhythmia (device programmed for slow VT detection and ATP at 100 bpm, LRL 50). K 3.7. Troponin normal x 2. ECG unchanged from baseline. Hgb 10.4.  PLAN: Symptomatic ventricular bigeminy in the setting of ischemic CMP  K and Mg supplement. Take mexiletine TID scheduled rather than prn. He reports that Dr. Lovena Le was considering the possibility that he may need another RF ablation. I don't expect these current events would warrant ablation, but this is an option if the current problem increases in frequency.  Outpatient workup for anemia. Smoking cessation discussed  Sanda Klein, MD, [place] [phone] 06/08/2017, 8:58 AM

## 2017-06-08 NOTE — Progress Notes (Addendum)
Per [employer] interrogation:  "Dual chamber ICD check.  Normal device function.  No episodes. Atrial lead:  1.44mV, 499, 0.7V@0 .80ms. RV lead: 8.54mV, 471, 0.9V@0 .54ms Shock:  51  Atrial paced:  17% RV paced:  <1%  [employer] [phone]"

## 2017-06-08 NOTE — Discharge Instructions (Signed)
It was my pleasure taking care of you today!   Follow up with your cardiologist.  Take potassium and magnesium supplementation as directed.  Make sure you are taking all of your medications as directed, including Mexitil.  Return to ER for new or worsening symptoms, any additional concerns.

## 2017-06-10 ENCOUNTER — Encounter: Payer: Self-pay | Admitting: Internal Medicine

## 2017-06-10 ENCOUNTER — Ambulatory Visit (INDEPENDENT_AMBULATORY_CARE_PROVIDER_SITE_OTHER): Payer: Medicare Other | Admitting: Internal Medicine

## 2017-06-10 DIAGNOSIS — I499 Cardiac arrhythmia, unspecified: Secondary | ICD-10-CM

## 2017-06-10 DIAGNOSIS — D649 Anemia, unspecified: Secondary | ICD-10-CM | POA: Diagnosis not present

## 2017-06-10 DIAGNOSIS — D638 Anemia in other chronic diseases classified elsewhere: Secondary | ICD-10-CM | POA: Insufficient documentation

## 2017-06-10 NOTE — Patient Instructions (Signed)
We will have you do the stool cards to check for blood.

## 2017-06-10 NOTE — Assessment & Plan Note (Signed)
Following up with cardiology next week and no adjustment made to his medicines and no ICD fire. No acute ischemia at ER visit and no continued symptoms.

## 2017-06-10 NOTE — Progress Notes (Signed)
   Subjective:    Patient ID: Steven Guzman, male    DOB: 10/05/1940, 77 y.o.   MRN: [id number]  HPI The patient is a 77 YO man coming in for ER follow up. He was in for PVCs and change in HR. He was evaluated without evidence for ischemia and consult with his cardiologist revealed safe for discharge. He denies current symptoms. His blood counts were lower in the ER than prior so they recommended follow up with [place]. Last colonoscopy in 2012 with polyps and no further screening was recommended. He does take PPI and is on xarelto. No known bleeding or blood in stool he has seen. No nosebleeds or hemoptysis. Did have fall with hematoma in January.   PMH, [facility], social history reviewed and updated  Review of Systems  Constitutional: Negative.   HENT: Negative.   Eyes: Negative.   Respiratory: Negative for cough, chest tightness and shortness of breath.   Cardiovascular: Positive for palpitations. Negative for chest pain and leg swelling.  Gastrointestinal: Negative for abdominal distention, abdominal pain, constipation, diarrhea, nausea and vomiting.  Musculoskeletal: Negative.   Skin: Negative.   Neurological: Negative.   Psychiatric/Behavioral: Negative.       Objective:   Physical Exam  Constitutional: He is oriented to person, place, and time. He appears well-developed and well-nourished.  HENT:  Head: Normocephalic and atraumatic.  Eyes: EOM are normal.  Neck: Normal range of motion.  Cardiovascular: Normal rate.  Pulmonary/Chest: Effort normal and breath sounds normal. No respiratory distress. He has no wheezes. He has no rales.  Abdominal: Soft. Bowel sounds are normal. He exhibits no distension. There is no tenderness. There is no rebound.  Musculoskeletal: He exhibits no edema.  Neurological: He is alert and oriented to person, place, and time. Coordination normal.  Skin: Skin is warm and dry.  Psychiatric: He has a normal mood and affect.   Vitals:   06/10/17 1311  BP:  90/60  Pulse: (!) 52  Temp: 97.9 F (36.6 C)  TempSrc: Oral  SpO2: 95%  Weight: 223 lb (101.2 kg)  Height: 5\' 11"  (1.803 m)      Assessment & Plan:

## 2017-06-10 NOTE — Assessment & Plan Note (Signed)
Given stool cards times 3 today. Prior colon 2012 with polyps so there is possibility of GI source. GI felt he was too high for further screening. Advised to take multivitamin. Will watch CBC in 1-2 months. If FOBT positive will ask to return to GI.

## 2017-06-14 ENCOUNTER — Ambulatory Visit (INDEPENDENT_AMBULATORY_CARE_PROVIDER_SITE_OTHER): Payer: Medicare Other | Admitting: Internal Medicine

## 2017-06-14 ENCOUNTER — Encounter: Payer: Self-pay | Admitting: Internal Medicine

## 2017-06-14 VITALS — BP 120/58 | HR 61 | Ht 71.0 in | Wt 221.0 lb

## 2017-06-14 DIAGNOSIS — I472 Ventricular tachycardia, unspecified: Secondary | ICD-10-CM

## 2017-06-14 DIAGNOSIS — I480 Paroxysmal atrial fibrillation: Secondary | ICD-10-CM | POA: Diagnosis not present

## 2017-06-14 DIAGNOSIS — Z95810 Presence of automatic (implantable) cardiac defibrillator: Secondary | ICD-10-CM | POA: Diagnosis not present

## 2017-06-14 DIAGNOSIS — I493 Ventricular premature depolarization: Secondary | ICD-10-CM | POA: Diagnosis not present

## 2017-06-14 DIAGNOSIS — I5022 Chronic systolic (congestive) heart failure: Secondary | ICD-10-CM | POA: Diagnosis not present

## 2017-06-14 NOTE — Progress Notes (Signed)
HPI [name] returns today for evaluation of VT, CAD, chroinic systolic heart failure, and PAF. He was in the ED last week with slow VT which he tolerates well and was paced back to NSR. He has none since. He has class 2 CHF symptoms. His wife who is also a patient notes that he has been smoking but only minimally so. He has not had syncope and denies peripheral edema. He has easy bruisibility on his anti-coagulation. Allergies  Allergen Reactions  . Sulfa Antibiotics Hives  . Sulfasalazine Hives     Current Outpatient Medications  Medication Sig Dispense Refill  . albuterol (PROVENTIL) (2.5 MG/3ML) 0.083% nebulizer solution Take 3 mLs (2.5 mg total) by nebulization every 6 (six) hours as needed for wheezing or shortness of breath. 360 mL 12  . amiodarone (PACERONE) 200 MG tablet Take 1 tablet twice daily for 2 weeks then 1 tablet daily. 90 tablet 3  . atorvastatin (LIPITOR) 80 MG tablet Take 1 tablet (80 mg total) by mouth daily. 90 tablet 2  . benazepril (LOTENSIN) 10 MG tablet Take 0.5 tablets (5 mg total) by mouth daily. 45 tablet 3  . buPROPion (WELLBUTRIN) 75 MG tablet TAKE 1 TABLET(75 MG) BY MOUTH DAILY. NEED OFFICE VISIT. 90 tablet 1  . busPIRone (BUSPAR) 15 MG tablet TAKE 1 TABLET BY MOUTH THREE TIMES A DAY 90 tablet 1  . carvedilol (COREG) 6.25 MG tablet Take 1 tablet (6.25 mg total) by mouth 2 (two) times daily. 180 tablet 3  . cetirizine (ZYRTEC) 10 MG tablet Take 10 mg by mouth daily as needed for allergies.    . Choline Fenofibrate (FENOFIBRIC ACID) 135 MG CPDR TAKE 1 CAPSULE BY MOUTH DAILY 30 capsule 6  . escitalopram (LEXAPRO) 20 MG tablet Take 1 tablet (20 mg total) by mouth daily. Need office visit for further refills 90 tablet 1  . fluticasone (FLONASE) 50 MCG/ACT nasal spray Place 2 sprays into both nostrils daily.    . fluticasone furoate-vilanterol (BREO ELLIPTA) 100-25 MCG/INH AEPB Inhale 1 puff into the lungs daily. 60 each 3  . Fluticasone-Umeclidin-Vilant  (TRELEGY ELLIPTA) 100-62.5-25 MCG/INH AEPB Inhale 1 puff into the lungs daily. 60 each 5  . furosemide (LASIX) 40 MG tablet take 1/2 tablet by mouth once daily 45 tablet 3  . gabapentin (NEURONTIN) 100 MG capsule TAKE 3 CAPSULES BY MOUTH TWICE DAILY 540 capsule 0  . Ipratropium-Albuterol (COMBIVENT RESPIMAT) 20-100 MCG/ACT AERS respimat Inhale 1 puff into the lungs every 6 (six) hours. Shortness of breath or wheezing 1 Inhaler 3  . Magnesium Oxide 400 MG CAPS Take 1 capsule (400 mg total) by mouth daily. 30 capsule 3  . mexiletine (MEXITIL) 200 MG capsule Take 1 capsule (200 mg total) 3 (three) times daily by mouth. 270 capsule 3  . nitroGLYCERIN (NITROSTAT) 0.4 MG SL tablet Place 1 tablet (0.4 mg total) under the tongue every 5 (five) minutes x 3 doses as needed for chest pain. 25 tablet 6  . omeprazole (PRILOSEC) 20 MG capsule Take 20 mg by mouth 2 (two) times daily.    . potassium chloride SA (K-DUR,KLOR-CON) 20 MEQ tablet Take 1 tablet (20 mEq total) by mouth daily. 30 tablet 3  . Respiratory Therapy Supplies (FLUTTER) DEVI Use as directed 1 each 0  . tamsulosin (FLOMAX) 0.4 MG CAPS capsule TAKE 1 CAPSULE BY MOUTH EVERY DAY AFTER SUPPER 30 capsule 2  . Tiotropium Bromide Monohydrate (SPIRIVA RESPIMAT) 2.5 MCG/ACT AERS Inhale 2 puffs into the lungs  daily. 1 Inhaler 3  . XARELTO 20 MG TABS tablet TAKE 1 TABLET BY MOUTH ONCE DAILY WITH SUPPER 30 tablet 10   No current facility-administered medications for this visit.      Past Medical History:  Diagnosis Date  . AICD (automatic cardioverter/defibrillator) present   . Aneurysm ([place])    a. Aneurysmal infrarenal aorta up to 33 mm on CT 10/2014, recommended f/u due 10/2017  . Anginal pain ([place])   . Anxiety   . Basal cell carcinoma of nose    S/P MOHS  . Biliary acute pancreatitis   . CAD (coronary artery disease)    a. s/p MI in 1994 with PCI to LAD at that time b. cath 10/2012 demonstrated EF 30%, inferior akinesis with mild hypokinesis of  all walls, patent LAD and RCA stents; ostial PDA with 80-90% obstruction with medical therapy recommended   . Chronic systolic CHF (congestive heart failure) (H[facility])    EF 30 to 35 % as of 09/2014.   [name] CKD (chronic kidney disease), stage III ([place])   . Complication of anesthesia 10/2014   "had to have defibrillator w/ERCP"  . COPD (chronic obstructive pulmonary disease) ([place])    a. followed by pulmonary, COPD GOLD stage II  . Depression   . Diverticulosis of colon 07/2014   noted on CT  . GERD (gastroesophageal reflux disease)   . Hiatal hernia   . Hyperglycemia 10/2012.  [name] Hyperlipidemia   . Hypertension   . Myocardial infarction [facility]) 1994; 2011  . Pneumonia 1946; 2015  . Prostate enlargement 07/2014   observed on CT  . Tobacco abuse   . Ventricular tachycardia ([place])    a. 08/2009 s/p BSX E110 Teligen 100 AICD, ser#: [id number];  b. 08/2008 VT req ATP - detection reprogrammed from 160 to 150. c. EPS and VT ablation by Dr. Lovena Le 12/21/2014    ROS:   All systems reviewed and negative except as noted in the HPI.   Past Surgical History:  Procedure Laterality Date  . CATARACT EXTRACTION W/ INTRAOCULAR LENS  IMPLANT, BILATERAL Bilateral ~ 2011  . COLONOSCOPY    . ELECTROPHYSIOLOGIC STUDY N/A 12/21/2014   Procedure: V Tach Ablation;  Surgeon: [name], MD;  Location: [place] [facility];  Service: Cardiovascular;  Laterality: N/A;  . ERCP N/A 11/16/2014   Procedure: ENDOSCOPIC RETROGRADE CHOLANGIOPANCREATOGRAPHY (ERCP);  Surgeon: [name], MD;  Location: [facility];  Service: Endoscopy;  Laterality: N/A;  . EYE SURGERY    . FOOT SURGERY Left 2005   "fixed bone that stuck out in my ankle area"  . HEMORRHOID BANDING    . IMPLANTABLE CARDIOVERTER DEFIBRILLATOR IMPLANT  09/06/09   BSX dual chamber ICD implanted in [place] for cardiac arrest 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  . INGUINAL HERNIA REPAIR Right ~ 1995  . LEFT HEART CATHETERIZATION WITH CORONARY ANGIOGRAM N/A 11/25/2012    demonstrated EF 30%, inferior akinesis with mild hypokinesis of all walls, patent LAD and RCA stents; ostial PDA with 80-90% obstruction with medical therapy recommended  . MOHS SURGERY  2008   nose, skin graft  . RETINAL DETACHMENT SURGERY Right 2013  . TENOLYSIS Right 12/21/2013   Procedure: TENOLYSIS FLEXOR CARPI RADIALIS ,DEBRIDEMENT RIGHT JOINT WRIST,DEBRIDEMENT SCAPHOTRAPEZIAL TRAPEZOID, REPAIR OF EXTENSOR HOOD;  Surgeon: Daryll Brod, MD;  Location: [place];  Service: Orthopedics;  Laterality: Right;  . V-TACH ABLATION  12/21/2014  . VIDEO BRONCHOSCOPY Bilateral 01/09/2016   Procedure: VIDEO BRONCHOSCOPY WITHOUT FLUORO;  Surgeon:  Juanito Doom, MD;  Location: [facility];  Service: Cardiopulmonary;  Laterality: Bilateral;     Family History  Problem Relation Age of Onset  . Heart attack Brother   . CAD Father   . Hypertension Father   . CAD Mother   . Hypertension Mother   . Hypertension Brother   . Stroke Neg Hx      Social History   Socioeconomic History  . Marital status: Married    Spouse name: Not on file  . Number of children: Not on file  . Years of education: Not on file  . Highest education level: Not on file  Social Needs  . Financial resource strain: Not on file  . Food insecurity - worry: Not on file  . Food insecurity - inability: Not on file  . Transportation needs - medical: Not on file  . Transportation needs - non-medical: Not on file  Occupational History  . Occupation: Retired  Tobacco Use  . Smoking status: Former Smoker    Packs/day: 1.00    Years: 55.00    Pack years: 55.00    Types: Cigarettes    Last attempt to quit: 10/12/2016    Years since quitting: 0.6  . Smokeless tobacco: Never Used  . Tobacco comment: had quit, recently bought a pack  Substance and Sexual Activity  . Alcohol use: Yes    Alcohol/week: 0.0 oz    Comment: 12/21/2014 "might have a couple glasses of wine/year"  . Drug use: No  . Sexual activity:  Not Currently  Other Topics Concern  . Not on file  Social History Narrative  . Not on file     BP (!) 120/58   Pulse 61   Ht 5\' 11"  (1.803 m)   Wt 221 lb (100.2 kg)   BMI 30.82 kg/m   Physical Exam:  Well appearing 77 yo man, NAD HEENT: Unremarkable Neck:  6 cm JVD, no thyromegally Lymphatics:  No adenopathy Back:  No CVA tenderness Lungs:  Clear with no wheezes HEART:  Regular rate rhythm, no murmurs, no rubs, no clicks Abd:  soft, positive bowel sounds, no organomegally, no rebound, no guarding Ext:  2 plus pulses, no edema, no cyanosis, no clubbing Skin:  No rashes no nodules Neuro:  CN II through XII intact, motor grossly intact  DEVICE  Normal device function.  See PaceArt for details.   Assess/Plan: 1. VT - he will continue low dose amiodarone and mexitil.  2. ICD - interogation of his device demonstrates normal device function. 3. PAF - on amio, he is maintaining NSR. He will continue his anti-coagulation. 4. HTN - his blood pressure tends to run a little low but I will not down titrate his meds today as he feels well.   Mikle Bosworth.D.

## 2017-06-14 NOTE — Patient Instructions (Addendum)
Medication Instructions:  Your physician recommends that you continue on your current medications as directed. Please refer to the Current Medication list given to you today.  Labwork: None ordered.  Testing/Procedures: None ordered.  Follow-Up: Your physician wants you to follow-up in: 6 months with Dr. Lovena Le.   You will receive a reminder letter in the mail two months in advance. If you don't receive a letter, please call our office to schedule the follow-up appointment.  Remote monitoring is used to monitor your ICD from home. This monitoring reduces the number of office visits required to check your device to one time per year. It allows [place] to keep an eye on the functioning of your device to ensure it is working properly. You are scheduled for a device check from home on 06/30/2017. You may send your transmission at any time that day. If you have a wireless device, the transmission will be sent automatically. After your physician reviews your transmission, you will receive a postcard with your next transmission date.  Any Other Special Instructions Will Be Listed Below (If Applicable).  If you need a refill on your cardiac medications before your next appointment, please call your pharmacy.

## 2017-06-15 LAB — CUP PACEART INCLINIC DEVICE CHECK
Date Time Interrogation Session: 20190318040000
HIGH POWER IMPEDANCE MEASURED VALUE: 43 Ohm
HIGH POWER IMPEDANCE MEASURED VALUE: 49 Ohm
Implantable Lead Implant Date: 20110610
Implantable Lead Implant Date: 20110610
Implantable Lead Location: 753860
Implantable Lead Model: 185
Implantable Lead Serial Number: 28681386
Implantable Pulse Generator Implant Date: 20110610
Lead Channel Pacing Threshold Amplitude: 0.9 V
Lead Channel Pacing Threshold Amplitude: 0.9 V
Lead Channel Pacing Threshold Pulse Width: 0.4 ms
Lead Channel Sensing Intrinsic Amplitude: 8.8 mV
Lead Channel Setting Pacing Amplitude: 2 V
Lead Channel Setting Pacing Pulse Width: 0.4 ms
Lead Channel Setting Sensing Sensitivity: 0.6 mV
MDC IDC LEAD LOCATION: 753859
MDC IDC LEAD SERIAL: 339643
MDC IDC MSMT LEADCHNL RA IMPEDANCE VALUE: 570 Ohm
MDC IDC MSMT LEADCHNL RA SENSING INTR AMPL: 2.7 mV
MDC IDC MSMT LEADCHNL RV IMPEDANCE VALUE: 484 Ohm
MDC IDC MSMT LEADCHNL RV PACING THRESHOLD PULSEWIDTH: 0.4 ms
MDC IDC SET LEADCHNL RA PACING AMPLITUDE: 2 V
Pulse Gen Serial Number: 164892

## 2017-06-16 ENCOUNTER — Encounter (HOSPITAL_COMMUNITY): Payer: Self-pay | Admitting: *Deleted

## 2017-06-16 ENCOUNTER — Emergency Department (HOSPITAL_COMMUNITY)
Admission: EM | Admit: 2017-06-16 | Discharge: 2017-06-16 | Disposition: A | Discharge status: Home / Self Care | Payer: Medicare Other | Attending: Emergency Medicine | Admitting: Emergency Medicine

## 2017-06-16 ENCOUNTER — Telehealth: Payer: Self-pay | Admitting: Internal Medicine

## 2017-06-16 ENCOUNTER — Other Ambulatory Visit: Payer: Self-pay

## 2017-06-16 DIAGNOSIS — N182 Chronic kidney disease, stage 2 (mild): Secondary | ICD-10-CM | POA: Diagnosis not present

## 2017-06-16 DIAGNOSIS — I129 Hypertensive chronic kidney disease with stage 1 through stage 4 chronic kidney disease, or unspecified chronic kidney disease: Secondary | ICD-10-CM | POA: Insufficient documentation

## 2017-06-16 DIAGNOSIS — Z79899 Other long term (current) drug therapy: Secondary | ICD-10-CM | POA: Diagnosis not present

## 2017-06-16 DIAGNOSIS — Z87891 Personal history of nicotine dependence: Secondary | ICD-10-CM | POA: Diagnosis not present

## 2017-06-16 DIAGNOSIS — R69 Illness, unspecified: Secondary | ICD-10-CM

## 2017-06-16 DIAGNOSIS — Z95810 Presence of automatic (implantable) cardiac defibrillator: Secondary | ICD-10-CM | POA: Diagnosis not present

## 2017-06-16 DIAGNOSIS — D649 Anemia, unspecified: Secondary | ICD-10-CM | POA: Diagnosis not present

## 2017-06-16 DIAGNOSIS — J449 Chronic obstructive pulmonary disease, unspecified: Secondary | ICD-10-CM | POA: Insufficient documentation

## 2017-06-16 DIAGNOSIS — I5022 Chronic systolic (congestive) heart failure: Secondary | ICD-10-CM | POA: Insufficient documentation

## 2017-06-16 DIAGNOSIS — I252 Old myocardial infarction: Secondary | ICD-10-CM | POA: Diagnosis not present

## 2017-06-16 DIAGNOSIS — R42 Dizziness and giddiness: Secondary | ICD-10-CM | POA: Diagnosis not present

## 2017-06-16 DIAGNOSIS — R001 Bradycardia, unspecified: Secondary | ICD-10-CM | POA: Diagnosis not present

## 2017-06-16 LAB — URINALYSIS, ROUTINE W REFLEX MICROSCOPIC
Bilirubin Urine: NEGATIVE
GLUCOSE, UA: NEGATIVE mg/dL
Hgb urine dipstick: NEGATIVE
KETONES UR: NEGATIVE mg/dL
Leukocytes, UA: NEGATIVE
NITRITE: NEGATIVE
PH: 7 (ref 5.0–8.0)
PROTEIN: NEGATIVE mg/dL
Specific Gravity, Urine: 1.009 (ref 1.005–1.030)

## 2017-06-16 LAB — CBC
HEMATOCRIT: 32.2 % — AB (ref 39.0–52.0)
Hemoglobin: 9.9 g/dL — ABNORMAL LOW (ref 13.0–17.0)
MCH: 25.3 pg — ABNORMAL LOW (ref 26.0–34.0)
MCHC: 30.7 g/dL (ref 30.0–36.0)
MCV: 82.4 fL (ref 78.0–100.0)
Platelets: 247 10*3/uL (ref 150–400)
RBC: 3.91 MIL/uL — AB (ref 4.22–5.81)
RDW: 17.5 % — ABNORMAL HIGH (ref 11.5–15.5)
WBC: 5.6 10*3/uL (ref 4.0–10.5)

## 2017-06-16 LAB — BASIC METABOLIC PANEL
ANION GAP: 9 (ref 5–15)
BUN: 12 mg/dL (ref 6–20)
CHLORIDE: 103 mmol/L (ref 101–111)
CO2: 25 mmol/L (ref 22–32)
Calcium: 9.1 mg/dL (ref 8.9–10.3)
Creatinine, Ser: 1.21 mg/dL (ref 0.61–1.24)
GFR, EST NON AFRICAN AMERICAN: 56 mL/min — AB (ref >60)
Glucose, Bld: 103 mg/dL — ABNORMAL HIGH (ref 65–99)
POTASSIUM: 4.1 mmol/L (ref 3.5–5.1)
SODIUM: 137 mmol/L (ref 135–145)

## 2017-06-16 LAB — POC OCCULT BLOOD, ED: FECAL OCCULT BLD: NEGATIVE

## 2017-06-16 LAB — I-STAT TROPONIN, ED: Troponin i, poc: 0 ng/mL (ref 0.00–0.08)

## 2017-06-16 LAB — MAGNESIUM: Magnesium: 1.9 mg/dL (ref 1.7–2.4)

## 2017-06-16 LAB — CBG MONITORING, ED: Glucose-Capillary: 108 mg/dL — ABNORMAL HIGH (ref 65–99)

## 2017-06-16 NOTE — ED Provider Notes (Signed)
[facility] DEPT Provider Note   CSN: [id number] Arrival date & time: 06/16/17  1605     History   Chief Complaint Chief Complaint  Patient presents with  . Dizziness  . Near Syncope    HPI Steven Guzman is a 77 y.o. male.  HPI Patient went with his wife to her doctors appointments and then they went out to eat.  As he was walking to the parking lot (after having eaten lunch) patient began to feel lightheaded as though he might pass out.  He was able to hold onto a parking sign and support himself.  He reports he started to feel kind of weak in the knees but his wife let him use her cane and the symptoms passed.  He was able to  into the car and they drove to the emergency department for assessment.  Had no associated chest pain palpitations or dyspnea.  At this time patient feels back to normal.  Had an episode last week where upon he was seen at [facility] emergency department and had consultation with cardiology.  At that time, he reports his heart rate was elevated.  He reports he can tell when he has the high atrial rate.  The symptoms are different.  Review of systems, patient reports he has been having some some swelling in the right leg and pain in his foot.  His physician has been evaluating this.  Patient reports he had an ultrasound of his lower extremity that was negative within the past few weeks.  Patient takes daily Xarelto. Past Medical History:  Diagnosis Date  . AICD (automatic cardioverter/defibrillator) present   . Aneurysm ([place])    a. Aneurysmal infrarenal aorta up to 33 mm on CT 10/2014, recommended f/u due 10/2017  . Anginal pain ([place])   . Anxiety   . Basal cell carcinoma of nose    S/P MOHS  . Biliary acute pancreatitis   . CAD (coronary artery disease)    a. s/p MI in 1994 with PCI to LAD at that time b. cath 10/2012 demonstrated EF 30%, inferior akinesis with mild hypokinesis of all walls, patent LAD and RCA stents; ostial PDA  with 80-90% obstruction with medical therapy recommended   . Chronic systolic CHF (congestive heart failure) (HCC)    EF 30 to 35 % as of 09/2014.   [name] CKD (chronic kidney disease), stage III ([place])   . Complication of anesthesia 10/2014   "had to have defibrillator w/ERCP"  . COPD (chronic obstructive pulmonary disease) ([place])    a. followed by pulmonary, COPD GOLD stage II  . Depression   . Diverticulosis of colon 07/2014   noted on CT  . GERD (gastroesophageal reflux disease)   . Hiatal hernia   . Hyperglycemia 10/2012.  [name] Hyperlipidemia   . Hypertension   . Myocardial infarction [facility]) 1994; 2011  . Pneumonia 1946; 2015  . Prostate enlargement 07/2014   observed on CT  . Tobacco abuse   . Ventricular tachycardia ([place])    a. 08/2009 s/p BSX E110 Teligen 100 AICD, ser#: [id number];  b. 08/2008 VT req ATP - detection reprogrammed from 160 to 150. c. EPS and VT ablation by Dr. Lovena Le 12/21/2014    Patient Active Problem List   Diagnosis Date Noted  . Anemia 06/10/2017  . Right leg pain 04/09/2017  . Right ankle pain 04/09/2017  . Left arm pain 04/09/2017  . BPH (benign prostatic hyperplasia) 12/31/2016  . Arrhythmia 09/15/2016  .  Dizzinesses 05/22/2016  . Pancreatitis, chronic ([place]) 08/02/2015  . Mass of throat 06/30/2015  . Depression 05/24/2015  . Atrial fibrillation ([place]) 03/15/2015  . Routine general medical examination at a health care facility 05/12/2014  . Hemoptysis 03/16/2014  . COPD GOLD GRADE C 12/25/2013  . Chronic systolic heart failure ([place]) 07/07/2013  . CAD (coronary artery disease) 11/23/2012  . Cigarette smoker 11/23/2012  . Ventricular tachycardia ([place]) 11/23/2012  . Essential hypertension 11/23/2012  . Hyperlipidemia 11/23/2012  . ICD (implantable cardioverter-defibrillator) in place 11/23/2012    Past Surgical History:  Procedure Laterality Date  . CATARACT EXTRACTION W/ INTRAOCULAR LENS  IMPLANT, BILATERAL Bilateral ~ 2011  . COLONOSCOPY    .  ELECTROPHYSIOLOGIC STUDY N/A 12/21/2014   Procedure: V Tach Ablation;  Surgeon: [name], MD;  Location: [facility];  Service: Cardiovascular;  Laterality: N/A;  . ERCP N/A 11/16/2014   Procedure: ENDOSCOPIC RETROGRADE CHOLANGIOPANCREATOGRAPHY (ERCP);  Surgeon: [name], MD;  Location: [facility];  Service: Endoscopy;  Laterality: N/A;  . EYE SURGERY    . FOOT SURGERY Left 2005   "fixed bone that stuck out in my ankle area"  . HEMORRHOID BANDING    . IMPLANTABLE CARDIOVERTER DEFIBRILLATOR IMPLANT  09/06/09   BSX dual chamber ICD implanted in [place] for cardiac arrest 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  . INGUINAL HERNIA REPAIR Right ~ 1995  . LEFT HEART CATHETERIZATION WITH CORONARY ANGIOGRAM N/A 11/25/2012   demonstrated EF 30%, inferior akinesis with mild hypokinesis of all walls, patent LAD and RCA stents; ostial PDA with 80-90% obstruction with medical therapy recommended  . MOHS SURGERY  2008   nose, skin graft  . RETINAL DETACHMENT SURGERY Right 2013  . TENOLYSIS Right 12/21/2013   Procedure: TENOLYSIS FLEXOR CARPI RADIALIS ,DEBRIDEMENT RIGHT JOINT WRIST,DEBRIDEMENT SCAPHOTRAPEZIAL TRAPEZOID, REPAIR OF EXTENSOR HOOD;  Surgeon: Daryll Brod, MD;  Location: [facility];  Service: Orthopedics;  Laterality: Right;  . V-TACH ABLATION  12/21/2014  . VIDEO BRONCHOSCOPY Bilateral 01/09/2016   Procedure: VIDEO BRONCHOSCOPY WITHOUT FLUORO;  Surgeon: Juanito Doom, MD;  Location: [facility];  Service: Cardiopulmonary;  Laterality: Bilateral;       Home Medications    Prior to Admission medications   Medication Sig Start Date End Date Taking? Authorizing Provider  albuterol (PROVENTIL) (2.5 MG/3ML) 0.083% nebulizer solution Take 3 mLs (2.5 mg total) by nebulization every 6 (six) hours as needed for wheezing or shortness of breath. 05/22/16  Yes Juanito Doom, MD  amiodarone (PACERONE) 200 MG tablet Take 1 tablet twice daily for 2 weeks then 1 tablet daily.  09/15/16  Yes Seiler, Amber K, NP  atorvastatin (LIPITOR) 80 MG tablet Take 1 tablet (80 mg total) by mouth daily. 11/13/16  Yes [name], MD  benazepril (LOTENSIN) 10 MG tablet Take 0.5 tablets (5 mg total) by mouth daily. 12/07/16  Yes [name], MD  buPROPion (WELLBUTRIN) 75 MG tablet TAKE 1 TABLET(75 MG) BY MOUTH DAILY. NEED OFFICE VISIT. 03/15/17  Yes Hoyt Koch, MD  busPIRone (BUSPAR) 15 MG tablet TAKE 1 TABLET BY MOUTH THREE TIMES A DAY 05/13/17  Yes Hoyt Koch, MD  carvedilol (COREG) 6.25 MG tablet Take 1 tablet (6.25 mg total) by mouth 2 (two) times daily. 09/18/16  Yes [name], MD  cetirizine (ZYRTEC) 10 MG tablet Take 10 mg by mouth daily as needed for allergies.   Yes [provider]  Choline Fenofibrate (FENOFIBRIC ACID) 135 MG CPDR TAKE  1 CAPSULE BY MOUTH DAILY 05/18/17  Yes Belva Crome, MD  escitalopram (LEXAPRO) 20 MG tablet Take 1 tablet (20 mg total) by mouth daily. Need office visit for further refills 12/21/16  Yes Hoyt Koch, MD  fluticasone [facility]) 50 MCG/ACT nasal spray Place 2 sprays into both nostrils daily.   Yes [provider]  fluticasone furoate-vilanterol (BREO ELLIPTA) 100-25 MCG/INH AEPB Inhale 1 puff into the lungs daily. 05/14/17  Yes Juanito Doom, MD  furosemide (LASIX) 40 MG tablet take 1/2 tablet by mouth once daily 07/13/16  Yes Belva Crome, MD  gabapentin (NEURONTIN) 100 MG capsule TAKE 3 CAPSULES BY MOUTH TWICE DAILY 03/29/17  Yes Hoyt Koch, MD  HYDROcodone-acetaminophen (NORCO/VICODIN) 5-325 MG tablet Take 1 tablet by mouth. EVERY 4 TO 6 HOURS AS NEEDED PAIN 06/09/17  Yes [provider]  Magnesium Oxide 400 MG CAPS Take 1 capsule (400 mg total) by mouth daily. 06/08/17  Yes Dunn, Dayna N, PA-C  mexiletine (MEXITIL) 200 MG capsule Take 1 capsule (200 mg total) 3 (three) times daily by mouth. 02/08/17  Yes [name], MD  omeprazole (PRILOSEC) 20 MG capsule  Take 20 mg by mouth 2 (two) times daily.   Yes [provider]  Polyethyl Glycol-Propyl Glycol (SYSTANE OP) Place 1 drop into both eyes daily as needed (dry eyes).   Yes [provider]  potassium chloride SA (K-DUR,KLOR-CON) 20 MEQ tablet Take 1 tablet (20 mEq total) by mouth daily. 06/08/17  Yes Dunn, Dayna N, PA-C  tamsulosin (FLOMAX) 0.4 MG CAPS capsule TAKE 1 CAPSULE BY MOUTH EVERY DAY AFTER SUPPER 05/03/17  Yes Hoyt Koch, MD  Tiotropium Bromide Monohydrate (SPIRIVA RESPIMAT) 2.5 MCG/ACT AERS Inhale 2 puffs into the lungs daily. 05/14/17  Yes Juanito Doom, MD  XARELTO 20 MG TABS tablet TAKE 1 TABLET BY MOUTH ONCE DAILY WITH SUPPER 11/25/16  Yes [name], MD  Fluticasone-Umeclidin-Vilant (TRELEGY ELLIPTA) 100-62.5-25 MCG/INH AEPB Inhale 1 puff into the lungs daily. Patient not taking: Reported on 06/16/2017 04/15/17   Juanito Doom, MD  Ipratropium-Albuterol (COMBIVENT RESPIMAT) 20-100 MCG/ACT AERS respimat Inhale 1 puff into the lungs every 6 (six) hours. Shortness of breath or wheezing Patient not taking: Reported on 06/16/2017 01/28/17   Juanito Doom, MD  nitroGLYCERIN (NITROSTAT) 0.4 MG SL tablet Place 1 tablet (0.4 mg total) under the tongue every 5 (five) minutes x 3 doses as needed for chest pain. 04/02/17   [name], MD  Respiratory Therapy Supplies (FLUTTER) DEVI Use as directed 03/16/17   Juanito Doom, MD    Family History Family History  Problem Relation Age of Onset  . Heart attack Brother   . CAD Father   . Hypertension Father   . CAD Mother   . Hypertension Mother   . Hypertension Brother   . Stroke Neg Hx     Social History Social History   Tobacco Use  . Smoking status: Former Smoker    Packs/day: 1.00    Years: 55.00    Pack years: 55.00    Types: Cigarettes    Last attempt to quit: 10/12/2016    Years since quitting: 0.6  . Smokeless tobacco: Never Used  . Tobacco comment: had quit, recently bought a  pack  Substance Use Topics  . Alcohol use: Yes    Alcohol/week: 0.0 oz    Comment: 12/21/2014 "might have a couple glasses of wine/year"  . Drug use: No  Allergies   Sulfa antibiotics and Sulfasalazine   Review of Systems Review of Systems 10 Systems reviewed and are negative for acute change except as noted in the HPI.  Physical Exam Updated Vital Signs BP 121/67   Pulse (!) 53   Temp 97.9 F (36.6 C) (Oral)   Resp 20   Ht 5\' 11"  (1.803 m)   Wt 100.2 kg (221 lb)   SpO2 97%   BMI 30.82 kg/m   Physical Exam  Constitutional: He is oriented to person, place, and time.  Alert and nontoxic.  Clinically well in appearance.  No acute distress.  HENT:  Head: Normocephalic and atraumatic.  Mouth/Throat: Oropharynx is clear and moist.  Eyes: EOM are normal.  Cardiovascular:  Bradycardia in the 50s.  Distant heart sounds.  Good distal radial pulses.  Correlate with monitor rate.  Pulmonary/Chest:  No respiratory distress.  Good airflow throughout lung fields.  Some basilar expiratory wheeze.  Abdominal: Soft. He exhibits no distension. There is no tenderness. There is no guarding.  Musculoskeletal: Normal range of motion.  Patient has mild edema of the right lower extremity.  Arch is fallen and flattened appearance.  Symmetric dorsalis pedis pulses.  Neurological: He is alert and oriented to person, place, and time. No cranial nerve deficit. He exhibits normal muscle tone. Coordination normal.  Skin: Skin is warm and dry.  Psychiatric: He has a normal mood and affect.   Rectal: Trace brown green stool in the vault.  No melena  ED Treatments / Results  Labs (all labs ordered are listed, but only abnormal results are displayed) Labs Reviewed  BASIC METABOLIC PANEL - Abnormal; Notable for the following components:      Result Value   Glucose, Bld 103 (*)    GFR calc non Af Amer 56 (*)    All other components within normal limits  CBC - Abnormal; Notable for the  following components:   RBC 3.91 (*)    Hemoglobin 9.9 (*)    HCT 32.2 (*)    MCH 25.3 (*)    RDW 17.5 (*)    All other components within normal limits  CBG MONITORING, ED - Abnormal; Notable for the following components:   Glucose-Capillary 108 (*)    All other components within normal limits  URINALYSIS, ROUTINE W REFLEX MICROSCOPIC  MAGNESIUM  I-STAT TROPONIN, ED  POC OCCULT BLOOD, ED    EKG  EKG Interpretation  Date/Time:  Wednesday June 16 2017 16:13:16 EDT Ventricular Rate:  56 PR Interval:    QRS Duration: 134 QT Interval:  477 QTC Calculation: 461 R Axis:   82 Text Interpretation:  baseline artifact. rythm regular. no QRS morphology change compared to previous Confirmed by Charlesetta Shanks [id number]) on 06/16/2017 5:17:33 PM       Radiology No results found.  Procedures Procedures (including critical care time)  Medications Ordered in ED Medications - No data to display   Initial Impression / Assessment and Plan / ED Course  I have reviewed the triage vital signs and the nursing notes.  Pertinent labs & imaging results that were available during my care of the patient were reviewed by me and considered in my medical decision making (see chart for details).      Final Clinical Impressions(s) / ED Diagnoses   Final diagnoses:  Lightheadedness  Severe comorbid illness  Anemia, unspecified type   Patient presented on the above.  He is alert and clinically well in appearance.  He has multiple severe  comorbid illnesses.  He does have a pacer defibrillator.  It was seen last week in the emergency department in consultation by cardiology.  At this time, no elevation in troponins.  Patient did not experience any chest pain or shortness of breath.  He did not experience palpitations or racing of the heart.  No associated neurologic symptoms.  He does describe getting lightheaded intermittently particularly with position change.  He reports this happens after he has  been driving and he gets out of the car.  At this time I suspect this is orthostatic hypotension is multifactorial.  Patient's baseline heart rate is in the 50s.  Blood pressure is in the low 100s.  Patient has been gradually becoming mildly anemic.  This time hemoglobin is at 9.9, gradually drifting down since June of last year.  Stool is brown and normal in appearance.  There is no melena or sign of brisk of aggressive bleeding.  He is however on Xarelto.  At this time, patient is stable for continued outpatient management.  He reports his primary care doctor is already evaluating his anemia.  It is not a candidate for transfusion at this time.  Will also need follow-up with cardiology to determine if he would benefit from an increased Default rate for his pacer and or decrease in his antihypertensive medications. ED Discharge Orders    None       Charlesetta Shanks, MD 06/16/17 [phone]

## 2017-06-16 NOTE — Telephone Encounter (Signed)
New message    STAT if patient feels like he/she is going to faint   1) Are you dizzy now? Yes almost passed out coming out of restaurant   2) Do you feel faint or have you passed out?  Feels faint  3) Do you have any other symptoms? Not feeling well   4) Have you checked your HR and BP (record if available)? No

## 2017-06-16 NOTE — ED Notes (Signed)
Bed: XB26 Expected date:  Expected time:  Means of arrival:  Comments: Triage" Araceli Bouche

## 2017-06-16 NOTE — Discharge Instructions (Signed)
1.  Call your cardiologist to schedule a recheck.  You will need to discuss your blood pressure readings and heart rate.  These may be contributing to lightheadedness with position change.  As well, you have gradually been becoming anemic.  You are not at a level that would require blood transfusion at this time, but this needs to be addressed with your doctor and your cardiologist.  You are taking Xarelto which can contribute to easier bleeding and blood loss.

## 2017-06-16 NOTE — ED Notes (Signed)
Ambulated pt with monitor, lowest oxygen level was 96%.

## 2017-06-16 NOTE — Telephone Encounter (Signed)
Spoke with pt's wife and she was insisting on coming into the office. She stated Dr Lovena Le told her "they could come in anytime if he was having these symptoms." At that time I checked the DOD schedule and there was no availability. She stated he was still feeling dizzy and I asked her if there was anyway she could "find his radial pulse which is located on his wrist below his thumb and count it for one minute." She stated "I cant do that. Forget it, I'm taking him to the emergency room." I told her I was sorry I couldn't help her more and I agreed if he was still symptomatic to go to the Emergency room. She asked me for my name and thanked me for the call.

## 2017-06-16 NOTE — ED Triage Notes (Signed)
Pt presents after episode of dizziness and near syncope, pt feels weak with his knees. Pt has history of Atrial Tachycardia, [facility] for the Atrial tachycardia.Has ACID

## 2017-06-17 ENCOUNTER — Telehealth: Payer: Self-pay | Admitting: *Deleted

## 2017-06-17 NOTE — Telephone Encounter (Signed)
Called pt to verify hosp appt that has been made for 06/21/17 spoke w/wife she confirm the appt. Also inform had some additional questions concerning discharge. Completed TCM call below.Steven Guzman  Transition Care Management Follow-up Telephone Call   Date discharged? 06/17/17   How have you been since you were released from the hospital? Per wife he states husband doing ok now   Do you understand why you were in the hospital? YES   Do you understand the discharge instructions? YES   Where were you discharged to? Home   Items Reviewed:  Medications reviewed: YES  Allergies reviewed: YES  Dietary changes reviewed: NO  Referrals reviewed: No referral needed   Functional Questionnaire:   Activities of Daily Living (ADLs):   She states he are independent in the following: bathing and hygiene, feeding, continence, grooming, toileting and dressing States he require assistance with the following: ambulation   Any transportation issues/concerns?: NO    Any patient concerns? NO   Confirmed importance and date/time of follow-up visits scheduled YES, appt 06/21/17  Provider Appointment booked with Dr Sharlet Salina  Confirmed with patient if condition begins to worsen call PCP or go to the ER.  Patient was given the office number and encouraged to call back with question or concerns.  : YES

## 2017-06-17 NOTE — Telephone Encounter (Signed)
Pt was seen in ER yesterday for presyncopal/dizzy episode.   Pt discharged home after being assessed.  Per ER physician-have Pt follow up with cardiology to review bottom pacing threshold vs reducing blood pressure medication.  Will discuss with Dr. Lovena Le. Call placed to Pt.  Pt feeling well this morning.   Notified Pt I would be following up with Dr. Lovena Le to determine if any changes need to be made to Pt regimen.  Pt indicates understanding.  Will follow up.

## 2017-06-18 ENCOUNTER — Other Ambulatory Visit: Payer: Self-pay | Admitting: Internal Medicine

## 2017-06-18 NOTE — Telephone Encounter (Signed)
Per Dr. Sherron Ales Pt continue his current medications.  If Pt has another episode, have him call office and Dr. Lovena Le will review his BP meds to see if they need to be reduced.   Left CVM per DPR notifying of above.  Notified to call office if any further issues.

## 2017-06-21 ENCOUNTER — Encounter: Payer: Self-pay | Admitting: Internal Medicine

## 2017-06-21 ENCOUNTER — Telehealth: Payer: Self-pay | Admitting: Pulmonary Disease

## 2017-06-21 ENCOUNTER — Ambulatory Visit (INDEPENDENT_AMBULATORY_CARE_PROVIDER_SITE_OTHER): Payer: Medicare Other | Admitting: Internal Medicine

## 2017-06-21 VITALS — BP 98/60 | HR 55 | Temp 98.2°F | Ht 71.0 in | Wt 224.0 lb

## 2017-06-21 DIAGNOSIS — D649 Anemia, unspecified: Secondary | ICD-10-CM

## 2017-06-21 DIAGNOSIS — R42 Dizziness and giddiness: Secondary | ICD-10-CM | POA: Diagnosis not present

## 2017-06-21 NOTE — Telephone Encounter (Signed)
lmtcb x1 for pt. 

## 2017-06-21 NOTE — Patient Instructions (Addendum)
We will put in labs and you can come back in about 2 weeks to do the blood work.   Iron-Rich Diet Iron is a mineral that helps your body to produce hemoglobin. Hemoglobin is a protein in your red blood cells that carries oxygen to your body's tissues. Eating too little iron may cause you to feel weak and tired, and it can increase your risk for infection. Eating enough iron is necessary for your body's metabolism, muscle function, and nervous system. Iron is naturally found in many foods. It can also be added to foods or fortified in foods. There are two types of dietary iron:  Heme iron. Heme iron is absorbed by the body more easily than nonheme iron. Heme iron is found in meat, poultry, and fish.  Nonheme iron. Nonheme iron is found in dietary supplements, iron-fortified grains, beans, and vegetables.  You may need to follow an iron-rich diet if:  You have been diagnosed with iron deficiency or iron-deficiency anemia.  You have a condition that prevents you from absorbing dietary iron, such as: ? Infection in your intestines. ? Celiac disease. This involves long-lasting (chronic) inflammation of your intestines.  You do not eat enough iron.  You eat a diet that is high in foods that impair iron absorption.  You have lost a lot of blood.  You have heavy bleeding during your menstrual cycle.  You are pregnant.  What is my plan? Your health care provider may help you to determine how much iron you need per day based on your condition. Generally, when a person consumes sufficient amounts of iron in the diet, the following iron needs are met:  Men. ? 53-4 years old: 11 mg per day. ? 57-36 years old: 8 mg per day.  Women. ? 40-63 years old: 15 mg per day. ? 62-109 years old: 18 mg per day. ? Over 91 years old: 8 mg per day. ? Pregnant women: 27 mg per day. ? Breastfeeding women: 9 mg per day.  What do I need to know about an iron-rich diet?  Eat fresh fruits and vegetables  that are high in vitamin C along with foods that are high in iron. This will help increase the amount of iron that your body absorbs from food, especially with foods containing nonheme iron. Foods that are high in vitamin C include oranges, peppers, tomatoes, and mango.  Take iron supplements only as directed by your health care provider. Overdose of iron can be life-threatening. If you were prescribed iron supplements, take them with orange juice or a vitamin C supplement.  Cook foods in pots and pans that are made from iron.  Eat nonheme iron-containing foods alongside foods that are high in heme iron. This helps to improve your iron absorption.  Certain foods and drinks contain compounds that impair iron absorption. Avoid eating these foods in the same meal as iron-rich foods or with iron supplements. These include: ? Coffee, black tea, and red wine. ? Milk, dairy products, and foods that are high in calcium. ? Beans, soybeans, and peas. ? Whole grains.  When eating foods that contain both nonheme iron and compounds that impair iron absorption, follow these tips to absorb iron better. ? Soak beans overnight before cooking. ? Soak whole grains overnight and drain them before using. ? Ferment flours before baking, such as using yeast in bread dough. What foods can I eat? Grains Iron-fortified breakfast cereal. Iron-fortified whole-wheat bread. Enriched rice. Sprouted grains. Vegetables Spinach. Potatoes with skin. [place]  peas. Broccoli. Red and green bell peppers. Fermented vegetables. Fruits Prunes. Raisins. Oranges. Strawberries. Mango. Grapefruit. Meats and Other Protein Sources Beef liver. Oysters. Beef. Shrimp. [place]. Chicken. [place]. Sardines. Chickpeas. Nuts. Tofu. Beverages Tomato juice. Fresh orange juice. Prune juice. Hibiscus tea. Fortified instant breakfast shakes. Condiments Tahini. Fermented soy sauce. Sweets and Desserts Black-strap molasses. Other Wheat germ. The  items listed above may not be a complete list of recommended foods or beverages. Contact your dietitian for more options. What foods are not recommended? Grains Whole grains. Bran cereal. Bran flour. Oats. Vegetables Artichokes. Brussels sprouts. Kale. Fruits Blueberries. Raspberries. Strawberries. Figs. Meats and Other Protein Sources Soybeans. Products made from soy protein. Dairy Milk. Cream. Cheese. Yogurt. Cottage cheese. Beverages Coffee. Black tea. Red wine. Sweets and Desserts Cocoa. Chocolate. Ice cream. Other [name]. Oregano. Parsley. The items listed above may not be a complete list of foods and beverages to avoid. Contact your dietitian for more information. This information is not intended to replace advice given to you by your health care provider. Make sure you discuss any questions you have with your health care provider. Document Released: 10/28/2004 Document Revised: 10/04/2015 Document Reviewed: 10/11/2013 Elsevier Interactive Patient Education  [employer].

## 2017-06-21 NOTE — Progress Notes (Signed)
   Subjective:    Patient ID: Steven Guzman, male    DOB: Oct 10, 1940, 76 y.o.   MRN: [id number]  HPI The patient is a 77 YO man coming in for ER follow up (had an episode of brief lightheadedness which passed without any intervention, ER with stable labs). He is doing poorly since that time. Still feeling some lack of energy. Denies syncope or pre-syncope. He is feeling some lightheaded at times. Denies bleeding or dark stools. He did have dental extraction 1-2 weeks ago and they are not bleeding anymore. He did also have a fall with hematoma on the right lower leg in January. Had fecal occult blood done in the ER which was negative. He will not get colonoscopy again due to high risk for the procedure. His last was without significant findings and will age out before next.   PMH, [facility], social history reviewed and updated.   Review of Systems  Constitutional: Positive for activity change and appetite change.  HENT: Negative.   Eyes: Negative.   Respiratory: Negative for cough, chest tightness and shortness of breath.   Cardiovascular: Negative for chest pain, palpitations and leg swelling.  Gastrointestinal: Negative for abdominal distention, abdominal pain, anal bleeding, blood in stool, constipation, diarrhea, nausea and vomiting.  Musculoskeletal: Negative.   Skin: Negative.   Neurological: Positive for dizziness and light-headedness. Negative for tremors, syncope and weakness.  Psychiatric/Behavioral: Negative.       Objective:   Physical Exam  Constitutional: He is oriented to person, place, and time. He appears well-developed and well-nourished.  HENT:  Head: Normocephalic and atraumatic.  Eyes: EOM are normal.  Neck: Normal range of motion.  Cardiovascular: Normal rate.  Pulmonary/Chest: Effort normal and breath sounds normal. No respiratory distress. He has no wheezes. He has no rales.  Abdominal: Soft. Bowel sounds are normal. He exhibits no distension. There is no tenderness.  There is no rebound.  Musculoskeletal: He exhibits no edema.  Neurological: He is alert and oriented to person, place, and time. Coordination normal.  Skin: Skin is warm and dry.   Vitals:   06/21/17 1413  BP: 98/60  Pulse: (!) 55  Temp: 98.2 F (36.8 C)  TempSrc: Oral  SpO2: 97%  Weight: 224 lb (101.6 kg)  Height: 5\' 11"  (1.803 m)      Assessment & Plan:

## 2017-06-22 NOTE — Telephone Encounter (Signed)
lmtcb x2 for pt. 

## 2017-06-22 NOTE — Assessment & Plan Note (Signed)
He has been taking benazepril lower dose and not much difference in symptoms. He is still taking coreg BID and amiodarone. He is very sensitive to fluctuations in HR so would not recommend to change those medications. Also taking 1/2 pill lasix daily which I would not stop as he has had chf flare in the past and does get swelling without it.

## 2017-06-22 NOTE — Telephone Encounter (Signed)
Spoke with Steven Guzman. He is wanting to switch back to Symbicort. States that [facility] is to expensive.  BQ - please advise. Thanks!

## 2017-06-22 NOTE — Assessment & Plan Note (Signed)
He has not returned the 3 stool cards for sampling and he is encouraged to do so. He would not be a candidate for GI procedure unless life threatening bleeding likely. He has had several blood loss which are not GI related in the last several months which could account for some of the change. He is asked to start iron pills and increase iron in diet.

## 2017-06-25 ENCOUNTER — Telehealth: Payer: Self-pay | Admitting: *Deleted

## 2017-06-25 MED ORDER — BUDESONIDE-FORMOTEROL FUMARATE 160-4.5 MCG/ACT IN AERO: 2.0000 | INHALATION_SPRAY | Freq: Two times a day (BID) | RESPIRATORY_TRACT | 5 refills | Status: DC

## 2017-06-25 NOTE — Telephone Encounter (Signed)
ICD remote monitoring alert received. ATP therapy delivered for ST @ 101bpm. Dr. Lovena Le would like his VT-1 zone increased to 110bpm.  Spoke with Mrs. Raulston (per [facility]). Advised that [name] did not have an arrhythmia, but his device delivered therapy and his device needs reprogramming per Dr. Lovena Le. She is agreeable to have him here for an appointment at 3pm 06/29/17.

## 2017-06-25 NOTE — Telephone Encounter (Signed)
OK by me 160 dose

## 2017-06-25 NOTE — Telephone Encounter (Signed)
BQ - please advise. Thanks. 

## 2017-06-25 NOTE — Telephone Encounter (Signed)
Called and spoke with pt's wife Steven Guzman letting her know BQ said we could change pt back to Symbicort from [place].  Steven Guzman expressed understanding. Verified pharmacy with Steven Guzman and sent Rx in.  Nothing further needed at this time.

## 2017-06-29 ENCOUNTER — Ambulatory Visit (INDEPENDENT_AMBULATORY_CARE_PROVIDER_SITE_OTHER): Payer: Self-pay | Admitting: *Deleted

## 2017-06-29 DIAGNOSIS — I472 Ventricular tachycardia, unspecified: Secondary | ICD-10-CM

## 2017-06-29 DIAGNOSIS — Z95810 Presence of automatic (implantable) cardiac defibrillator: Secondary | ICD-10-CM

## 2017-06-29 LAB — CUP PACEART INCLINIC DEVICE CHECK
Brady Statistic RA Percent Paced: 22 %
Brady Statistic RV Percent Paced: 1 % — CL
Date Time Interrogation Session: 20190402040000
HIGH POWER IMPEDANCE MEASURED VALUE: 43 Ohm
HIGH POWER IMPEDANCE MEASURED VALUE: 51 Ohm
Implantable Lead Implant Date: 20110610
Implantable Lead Location: 753859
Implantable Lead Model: 4135
Implantable Lead Serial Number: 28681386
Implantable Lead Serial Number: 339643
Lead Channel Impedance Value: 480 Ohm
Lead Channel Pacing Threshold Amplitude: 0.9 V
Lead Channel Pacing Threshold Amplitude: 1 V
Lead Channel Pacing Threshold Pulse Width: 0.4 ms
Lead Channel Sensing Intrinsic Amplitude: 3.3 mV
Lead Channel Sensing Intrinsic Amplitude: 9.6 mV
Lead Channel Setting Pacing Amplitude: 2 V
Lead Channel Setting Pacing Pulse Width: 0.4 ms
Lead Channel Setting Sensing Sensitivity: 0.6 mV
MDC IDC LEAD IMPLANT DT: 20110610
MDC IDC LEAD LOCATION: 753860
MDC IDC MSMT LEADCHNL RA IMPEDANCE VALUE: 602 Ohm
MDC IDC MSMT LEADCHNL RA PACING THRESHOLD PULSEWIDTH: 0.4 ms
MDC IDC PG IMPLANT DT: 20110610
MDC IDC SET LEADCHNL RV PACING AMPLITUDE: 2 V
Pulse Gen Serial Number: 164892

## 2017-06-29 NOTE — Progress Notes (Signed)
ICD check in clinic for VT-1 zone reprogramming after inappropriate ATP therapy on 06/24/17. Normal device function. Thresholds and sensing consistent with previous device measurements. Impedance trends stable over time. 4 monitored NSVT episodes--available EGMs are 1:1. 1 VT-1 episode shows ST unsuccessfully treated with ATP x6--reprogrammed VT-1 detection rate to 110bpm per GT. No mode switches. Histogram distribution appropriate for patient and level of activity. Device programmed at appropriate safety margins. Device programmed to optimize intrinsic conduction. Estimated longevity 4.5 years. Patient enrolled in remote follow-up. Patient education completed including shock plan. Latitude on 06/30/17 and ROV with GT in 11/2017.

## 2017-06-30 ENCOUNTER — Ambulatory Visit (INDEPENDENT_AMBULATORY_CARE_PROVIDER_SITE_OTHER): Payer: Medicare Other | Admitting: *Deleted

## 2017-06-30 ENCOUNTER — Telehealth: Payer: Self-pay | Admitting: Cardiology

## 2017-06-30 DIAGNOSIS — I472 Ventricular tachycardia, unspecified: Secondary | ICD-10-CM

## 2017-06-30 NOTE — Telephone Encounter (Signed)
Spoke with pt and reminded pt of remote transmission that is due today. Pt verbalized understanding.   

## 2017-07-01 ENCOUNTER — Encounter: Payer: Self-pay | Admitting: Cardiology

## 2017-07-01 ENCOUNTER — Encounter: Payer: Self-pay | Admitting: Internal Medicine

## 2017-07-01 ENCOUNTER — Ambulatory Visit: Payer: Medicare Other | Admitting: Internal Medicine

## 2017-07-01 NOTE — Progress Notes (Signed)
Remote ICD transmission.   

## 2017-07-02 ENCOUNTER — Telehealth: Payer: Self-pay | Admitting: *Deleted

## 2017-07-02 ENCOUNTER — Other Ambulatory Visit: Payer: Self-pay | Admitting: Interventional Cardiology

## 2017-07-02 MED ORDER — FUROSEMIDE 40 MG PO TABS: 20.0000 mg | ORAL_TABLET | Freq: Every day | ORAL | 0 refills | Status: DC

## 2017-07-02 MED ORDER — ATORVASTATIN CALCIUM 80 MG PO TABS: 80.0000 mg | ORAL_TABLET | Freq: Every day | ORAL | 0 refills | Status: DC

## 2017-07-02 NOTE — Telephone Encounter (Signed)
Pt's medication was sent to pt's pharmacy as requested. Confirmation received.  °

## 2017-07-02 NOTE — Telephone Encounter (Signed)
Spoke with pt regarding my chart message pt's wife sent for him requesting refill for Lasix.  Will send 3 month supply to pt's pharmacy.  Pt is due for follow up with Dr. Tamala Julian.  Appt made for pt to see Truitt Merle, NP on May 7 at 3:30

## 2017-07-06 ENCOUNTER — Other Ambulatory Visit: Payer: Self-pay | Admitting: Internal Medicine

## 2017-07-20 LAB — CUP PACEART REMOTE DEVICE CHECK
Brady Statistic RV Percent Paced: 0 %
HighPow Impedance: 49 Ohm
Implantable Lead Implant Date: 20110610
Implantable Lead Implant Date: 20110610
Implantable Lead Location: 753859
Implantable Lead Model: 4135
Implantable Lead Serial Number: 28681386
Implantable Pulse Generator Implant Date: 20110610
Lead Channel Impedance Value: 449 Ohm
Lead Channel Pacing Threshold Amplitude: 0.9 V
Lead Channel Pacing Threshold Amplitude: 1 V
Lead Channel Pacing Threshold Pulse Width: 0.4 ms
Lead Channel Pacing Threshold Pulse Width: 0.4 ms
Lead Channel Setting Pacing Amplitude: 2 V
Lead Channel Setting Pacing Pulse Width: 0.4 ms
Lead Channel Setting Sensing Sensitivity: 0.6 mV
MDC IDC LEAD LOCATION: 753860
MDC IDC LEAD SERIAL: 339643
MDC IDC MSMT BATTERY REMAINING LONGEVITY: 42 mo
MDC IDC MSMT BATTERY REMAINING PERCENTAGE: 54 %
MDC IDC MSMT LEADCHNL RA IMPEDANCE VALUE: 593 Ohm
MDC IDC SESS DTM: 20190403185500
MDC IDC SET LEADCHNL RV PACING AMPLITUDE: 2 V
MDC IDC STAT BRADY RA PERCENT PACED: 19 %
Pulse Gen Serial Number: 164892

## 2017-07-21 ENCOUNTER — Other Ambulatory Visit (INDEPENDENT_AMBULATORY_CARE_PROVIDER_SITE_OTHER): Payer: Medicare Other

## 2017-07-21 DIAGNOSIS — D649 Anemia, unspecified: Secondary | ICD-10-CM

## 2017-07-21 NOTE — Addendum Note (Signed)
Addended by: Karle Barr on: 07/21/2017 04:43 PM   Modules accepted: Orders

## 2017-07-22 LAB — HEMOCCULT SLIDES (X 3 CARDS)
Fecal Occult Blood: NEGATIVE
OCCULT 1: NEGATIVE
OCCULT 2: NEGATIVE
OCCULT 3: NEGATIVE
OCCULT 4: NEGATIVE
OCCULT 5: NEGATIVE

## 2017-07-30 ENCOUNTER — Other Ambulatory Visit: Payer: Self-pay | Admitting: Internal Medicine

## 2017-08-03 ENCOUNTER — Ambulatory Visit: Payer: Medicare Other | Admitting: Nurse Practitioner

## 2017-08-17 ENCOUNTER — Other Ambulatory Visit: Payer: Self-pay | Admitting: Internal Medicine

## 2017-08-24 ENCOUNTER — Ambulatory Visit: Payer: Self-pay

## 2017-08-24 ENCOUNTER — Other Ambulatory Visit (INDEPENDENT_AMBULATORY_CARE_PROVIDER_SITE_OTHER): Payer: Medicare Other

## 2017-08-24 ENCOUNTER — Ambulatory Visit (INDEPENDENT_AMBULATORY_CARE_PROVIDER_SITE_OTHER): Payer: Medicare Other | Admitting: Internal Medicine

## 2017-08-24 ENCOUNTER — Encounter: Payer: Self-pay | Admitting: Internal Medicine

## 2017-08-24 VITALS — BP 90/60 | HR 49 | Temp 97.8°F | Ht 71.0 in | Wt 224.0 lb

## 2017-08-24 DIAGNOSIS — D649 Anemia, unspecified: Secondary | ICD-10-CM

## 2017-08-24 DIAGNOSIS — E538 Deficiency of other specified B group vitamins: Secondary | ICD-10-CM

## 2017-08-24 DIAGNOSIS — R42 Dizziness and giddiness: Secondary | ICD-10-CM | POA: Diagnosis not present

## 2017-08-24 DIAGNOSIS — M79605 Pain in left leg: Secondary | ICD-10-CM | POA: Diagnosis not present

## 2017-08-24 LAB — CBC
HEMATOCRIT: 30.3 % — AB (ref 39.0–52.0)
HEMOGLOBIN: 9.6 g/dL — AB (ref 13.0–17.0)
MCHC: 31.6 g/dL (ref 30.0–36.0)
MCV: 76.2 fl — ABNORMAL LOW (ref 78.0–100.0)
Platelets: 268 10*3/uL (ref 150.0–400.0)
RBC: 3.98 Mil/uL — ABNORMAL LOW (ref 4.22–5.81)
RDW: 18.9 % — AB (ref 11.5–15.5)
WBC: 5.5 10*3/uL (ref 4.0–10.5)

## 2017-08-24 NOTE — Patient Instructions (Addendum)
We will contact the heart doctor to see if there could be a dose change.   We are checking the labs today.

## 2017-08-24 NOTE — Telephone Encounter (Signed)
Telephone call  from pt. who states  That he has a " knot" on his left leg  right below the knee. Pt. States its about the size of a "1/2 dollar  feels hard to touch  In the middle."   Denies any fever. The area is near some bruising. Pt. States he is on "blood thinners" . Request an appointment for tomorrow. Was able to work pt. In for This afternoon.     Reason for Disposition . [1] Redness AND [2] painful when touched AND [3] no fever  Answer Assessment - Initial Assessment Questions Wrong protocol  Answer Assessment - Initial Assessment Questions 1. LOCATION: "Where is the swelling located?"  (e.g., left, right, both knees)      Left knee 2. SIZE and DESCRIPTION: "What does the swelling look like?"  (e.g., entire knee, localized)    1/2/ size dollar 3. ONSET: "When did the swelling start?" "Does it come and go, or is it there all the time?"     yesterday 4. PAIN: "Is there any pain?" If so, ask: "How bad is it?" (Scale 1-10; or mild, moderate, severe)     6/10 5. SETTING: "Has there been any recent work, exercise or other activity that involved that part of the body?"      no 6. AGGRAVATING FACTORS: "What makes the knee swelling worse?" (e.g., walking, climbing stairs, running)     no 7. ASSOCIATED SYMPTOMS: "Is there any pain or redness?"     Redness painful to touch 8. OTHER SYMPTOMS: "Do you have any other symptoms?" (e.g., chest pain, difficulty breathing, fever, calf pain)     no 9. PREGNANCY: "Is there any chance you are pregnant?" "When was your last menstrual period?"     na  Protocols used: KNEE SWELLING-A-AH, LEG SWELLING AND EDEMA-A-AH

## 2017-08-25 ENCOUNTER — Encounter: Payer: Self-pay | Admitting: Internal Medicine

## 2017-08-25 DIAGNOSIS — E538 Deficiency of other specified B group vitamins: Secondary | ICD-10-CM | POA: Insufficient documentation

## 2017-08-25 LAB — FERRITIN: Ferritin: 9.1 ng/mL — ABNORMAL LOW (ref 22.0–322.0)

## 2017-08-25 LAB — VITAMIN B12: Vitamin B-12: 128 pg/mL — ABNORMAL LOW (ref 211–911)

## 2017-08-25 NOTE — Assessment & Plan Note (Signed)
Suspect he may need dosage adjustment of coreg or other medication. He wishes to let his heart specialist decide so we will forward a note to them to review his medications.

## 2017-08-25 NOTE — Assessment & Plan Note (Signed)
Checking B12 level today, can resume oral or IM replacement of B12 if indicated.

## 2017-08-25 NOTE — Progress Notes (Signed)
   Subjective:    Patient ID: Steven Guzman, male    DOB: Jun 21, 1940, 77 y.o.   MRN: [id number]  HPI The patient is a 77 YO man coming in for dizziness (worse lately, today BP low and HR lower than usual, no change to meds or doses recently, they have had to decrease benazepril dosing in the past, denies syncope, some falls recently at home) and a bump on his left leg (started 1 day ago, he noticed that there is a swollen spot, some sore to touch, does not hurt without manipulation, does hit that leg on a corner quite often, on blood thinners). He also wants follow up of his B12 level (previously on B12 shots, has not been in some time and wants to know if this level is okay, not taking oral supplements now).   Review of Systems  Constitutional: Negative.   HENT: Negative.   Eyes: Negative.   Respiratory: Negative for cough, chest tightness and shortness of breath.   Cardiovascular: Negative for chest pain, palpitations and leg swelling.  Gastrointestinal: Negative for abdominal distention, abdominal pain, constipation, diarrhea, nausea and vomiting.  Musculoskeletal: Negative.   Skin: Positive for color change and wound.  Neurological: Positive for dizziness and light-headedness. Negative for tremors, seizures, speech difficulty, weakness and numbness.  Psychiatric/Behavioral: Negative.       Objective:   Physical Exam  Constitutional: He is oriented to person, place, and time. He appears well-developed and well-nourished.  HENT:  Head: Normocephalic and atraumatic.  Eyes: EOM are normal.  Neck: Normal range of motion.  Cardiovascular: Normal rate and regular rhythm.  Pulmonary/Chest: Effort normal and breath sounds normal. No respiratory distress. He has no wheezes. He has no rales.  Abdominal: Soft. Bowel sounds are normal. He exhibits no distension. There is no tenderness. There is no rebound.  Musculoskeletal: He exhibits no edema.  Neurological: He is alert and oriented to  person, place, and time. Coordination normal.  Some orthostatic symptoms today  Skin: Skin is warm and dry.  Prominent vein on the left leg below the knee which is freely mobile and mildly tender to palpation  Psychiatric: He has a normal mood and affect.   Vitals:   08/24/17 1554  BP: 90/60  Pulse: (!) 49  Temp: 97.8 F (36.6 C)  TempSrc: Oral  SpO2: 94%  Weight: 224 lb (101.6 kg)  Height: 5\' 11"  (1.803 m)      Assessment & Plan:

## 2017-08-25 NOTE — Assessment & Plan Note (Addendum)
Suspect this is an irritated surface vein and advised to use ice on the area. No signs of hematoma. Has not missed any doses of his blood thinner.

## 2017-08-26 ENCOUNTER — Telehealth: Payer: Self-pay

## 2017-08-26 MED ORDER — CYANOCOBALAMIN 1000 MCG/ML IJ SOLN: 1000.0000 ug | INTRAMUSCULAR | 3 refills | Status: DC

## 2017-08-26 MED ORDER — "SYRINGE 25G X 5/8"" 3 ML MISC": 1.0000 | 0 refills | Status: DC

## 2017-08-26 NOTE — Addendum Note (Signed)
Addended by: Pricilla Holm A on: 08/26/2017 04:19 PM   Modules accepted: Orders

## 2017-08-26 NOTE — Telephone Encounter (Signed)
Routing to dr crawford---per your instructions on lab results, patient is to begin b12 injections---he already understands and knows how to draw from vial and administer shots---he has given himself allergy shots and b12 injections in the past---are you ok with calling in b12 vials/syringes to his pharmacy--he would rather give injections at home if possible---he also understands to start iron supplements OTC, too----please advise, I will call patient back, thanks

## 2017-08-26 NOTE — Telephone Encounter (Signed)
Copied from [place] [id number]. Topic: General - Other >> Aug 26, 2017  2:21 PM Yvette Rack wrote: Reason for CRM: Pt returned call. Pt requested a call back to discuss B-12 shot. Cb# [phone]  I have discussed dr crawfords note/instructions for b12 injections and iron supplements per her instructions on lab results

## 2017-08-26 NOTE — Telephone Encounter (Signed)
Sent in B12 and supplies for home injections.

## 2017-09-01 ENCOUNTER — Other Ambulatory Visit: Payer: Self-pay | Admitting: Nurse Practitioner

## 2017-09-01 DIAGNOSIS — I472 Ventricular tachycardia, unspecified: Secondary | ICD-10-CM

## 2017-09-05 DIAGNOSIS — S81812A Laceration without foreign body, left lower leg, initial encounter: Secondary | ICD-10-CM | POA: Diagnosis not present

## 2017-09-05 DIAGNOSIS — Y998 Other external cause status: Secondary | ICD-10-CM | POA: Diagnosis not present

## 2017-09-05 DIAGNOSIS — W260XXA Contact with knife, initial encounter: Secondary | ICD-10-CM | POA: Diagnosis not present

## 2017-09-05 DIAGNOSIS — R58 Hemorrhage, not elsewhere classified: Secondary | ICD-10-CM | POA: Diagnosis not present

## 2017-09-08 ENCOUNTER — Telehealth: Payer: Self-pay | Admitting: Internal Medicine

## 2017-09-08 DIAGNOSIS — I25810 Atherosclerosis of coronary artery bypass graft(s) without angina pectoris: Secondary | ICD-10-CM

## 2017-09-08 DIAGNOSIS — I5022 Chronic systolic (congestive) heart failure: Secondary | ICD-10-CM

## 2017-09-08 NOTE — Telephone Encounter (Signed)
Please call and ask him to reduce coreg to 1/2 pill twice daily after consultation with his cardiologist.

## 2017-09-08 NOTE — Telephone Encounter (Signed)
Called patient and informed of MD response. Patient states that the pills are way to small to cut in half that he will need another prescription sent in.

## 2017-09-09 MED ORDER — CARVEDILOL 3.125 MG PO TABS: 3.1250 mg | ORAL_TABLET | Freq: Two times a day (BID) | ORAL | 0 refills | Status: DC

## 2017-09-09 NOTE — Telephone Encounter (Signed)
Patients wife nancy inforemd that medication was sent in

## 2017-09-09 NOTE — Telephone Encounter (Signed)
Sent in lower dose, 1 pill twice daily.

## 2017-09-17 ENCOUNTER — Other Ambulatory Visit: Payer: Self-pay | Admitting: Interventional Cardiology

## 2017-09-17 ENCOUNTER — Other Ambulatory Visit: Payer: Self-pay | Admitting: Internal Medicine

## 2017-09-21 ENCOUNTER — Encounter: Payer: Self-pay | Admitting: Nurse Practitioner

## 2017-09-21 ENCOUNTER — Ambulatory Visit (INDEPENDENT_AMBULATORY_CARE_PROVIDER_SITE_OTHER): Payer: Medicare Other | Admitting: Nurse Practitioner

## 2017-09-21 VITALS — BP 108/66 | HR 61 | Ht 71.0 in | Wt 218.8 lb

## 2017-09-21 DIAGNOSIS — Z79899 Other long term (current) drug therapy: Secondary | ICD-10-CM

## 2017-09-21 DIAGNOSIS — I472 Ventricular tachycardia, unspecified: Secondary | ICD-10-CM

## 2017-09-21 DIAGNOSIS — I25810 Atherosclerosis of coronary artery bypass graft(s) without angina pectoris: Secondary | ICD-10-CM

## 2017-09-21 MED ORDER — ATORVASTATIN CALCIUM 80 MG PO TABS: 80.0000 mg | ORAL_TABLET | Freq: Every day | ORAL | 3 refills | Status: DC

## 2017-09-21 NOTE — Patient Instructions (Signed)
We will be checking the following labs today - BMET, CBC, HPF, Lipids, MG and TSh   Medication Instructions:    Continue with your current medicines.   I sent in your refill today for the Atorvastatin    Testing/Procedures To Be Arranged:  N/A  Follow-Up:   See Dr. Tamala Julian in 6 months  See Dr. Lovena Le as planned.     Other Special Instructions:   N/A    If you need a refill on your cardiac medications before your next appointment, please call your pharmacy.   Call the [place] office at [phone] if you have any questions, problems or concerns.

## 2017-09-21 NOTE — Progress Notes (Signed)
CARDIOLOGY OFFICE NOTE  Date:  09/21/2017    Steven Guzman Date of Birth: 02-Aug-1940 Medical Record #[id number]  PCP:  Hoyt Koch, MD  Cardiologist:  Smith/Taylor  Chief Complaint  Patient presents with  . Coronary Artery Disease    History of Present Illness: Steven Guzman is a 77 y.o. male who presents today for a follow up visit. He is seen for Dr. Tamala Julian and Dr. Lovena Le.   He has a history of CAD, ischemic CM, VT, chronic systolic HF and PAF. He is on both amiodarone and Mexitil.  He has an ICD in place. He smokes rarely. EF is 35% but last study from 2018 with EF of 40 to 45%. Other issues include COPD, HTN, prior CABG, and HLD.    He has not seen Dr. Tamala Julian in some time. He saw Dr. Lovena Le back in March.   Comes in today. Here alone. Says he is doing ok. He has not had much in the way of chest pain. Says his breathing is stable and at baseline. No shocks. Did have a spell last night - HR was 94 - he felt a little jittery and short of breath - so he took an extra Mexitil and it resolved. No shocks. Needs labs. Needs lipitor refilled. No swelling. Weight is down a few pounds. He uses a cane. No falls noted. Some bruising. Overall, he feels like he is holding his own. He notes that he will get a little dizzy if he gets up too quickly. He has not had syncope.   Past Medical History:  Diagnosis Date  . AICD (automatic cardioverter/defibrillator) present   . Aneurysm ([place])    a. Aneurysmal infrarenal aorta up to 33 mm on CT 10/2014, recommended f/u due 10/2017  . Anginal pain ([place])   . Anxiety   . Basal cell carcinoma of nose    S/P MOHS  . Biliary acute pancreatitis   . CAD (coronary artery disease)    a. s/p MI in 1994 with PCI to LAD at that time b. cath 10/2012 demonstrated EF 30%, inferior akinesis with mild hypokinesis of all walls, patent LAD and RCA stents; ostial PDA with 80-90% obstruction with medical therapy recommended   . Chronic systolic CHF  (congestive heart failure) (HCC)    EF 30 to 35 % as of 09/2014.   [name] CKD (chronic kidney disease), stage III ([place])   . Complication of anesthesia 10/2014   "had to have defibrillator w/ERCP"  . COPD (chronic obstructive pulmonary disease) ([place])    a. followed by pulmonary, COPD GOLD stage II  . Depression   . Diverticulosis of colon 07/2014   noted on CT  . GERD (gastroesophageal reflux disease)   . Hiatal hernia   . Hyperglycemia 10/2012.  [name] Hyperlipidemia   . Hypertension   . Myocardial infarction [facility]) 1994; 2011  . Pneumonia 1946; 2015  . Prostate enlargement 07/2014   observed on CT  . Tobacco abuse   . Ventricular tachycardia ([place])    a. 08/2009 s/p BSX E110 Teligen 100 AICD, ser#: [id number];  b. 08/2008 VT req ATP - detection reprogrammed from 160 to 150. c. EPS and VT ablation by Dr. Lovena Le 12/21/2014    Past Surgical History:  Procedure Laterality Date  . CATARACT EXTRACTION W/ INTRAOCULAR LENS  IMPLANT, BILATERAL Bilateral ~ 2011  . COLONOSCOPY    . ELECTROPHYSIOLOGIC STUDY N/A 12/21/2014   Procedure: V Tach Ablation;  Surgeon: [name], MD;  Location: [facility];  Service: Cardiovascular;  Laterality: N/A;  . ERCP N/A 11/16/2014   Procedure: ENDOSCOPIC RETROGRADE CHOLANGIOPANCREATOGRAPHY (ERCP);  Surgeon: [name], MD;  Location: [facility];  Service: Endoscopy;  Laterality: N/A;  . EYE SURGERY    . FOOT SURGERY Left 2005   "fixed bone that stuck out in my ankle area"  . HEMORRHOID BANDING    . IMPLANTABLE CARDIOVERTER DEFIBRILLATOR IMPLANT  09/06/09   BSX dual chamber ICD implanted in [place] for cardiac arrest 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  . INGUINAL HERNIA REPAIR Right ~ 1995  . LEFT HEART CATHETERIZATION WITH CORONARY ANGIOGRAM N/A 11/25/2012   demonstrated EF 30%, inferior akinesis with mild hypokinesis of all walls, patent LAD and RCA stents; ostial PDA with 80-90% obstruction with medical therapy recommended  . MOHS SURGERY  2008   nose, skin graft  .  RETINAL DETACHMENT SURGERY Right 2013  . TENOLYSIS Right 12/21/2013   Procedure: TENOLYSIS FLEXOR CARPI RADIALIS ,DEBRIDEMENT RIGHT JOINT WRIST,DEBRIDEMENT SCAPHOTRAPEZIAL TRAPEZOID, REPAIR OF EXTENSOR HOOD;  Surgeon: Daryll Brod, MD;  Location: [facility];  Service: Orthopedics;  Laterality: Right;  . V-TACH ABLATION  12/21/2014  . VIDEO BRONCHOSCOPY Bilateral 01/09/2016   Procedure: VIDEO BRONCHOSCOPY WITHOUT FLUORO;  Surgeon: Juanito Doom, MD;  [facility];  Service: Cardiopulmonary;  Laterality: Bilateral;     Medications: Current Meds  Medication Sig  . albuterol (PROVENTIL) (2.5 MG/3ML) 0.083% nebulizer solution Take 3 mLs (2.5 mg total) by nebulization every 6 (six) hours as needed for wheezing or shortness of breath.  [name] amiodarone (PACERONE) 200 MG tablet Take 1 tablet (200 mg total) by mouth daily.  [name] atorvastatin (LIPITOR) 80 MG tablet Take 1 tablet (80 mg total) by mouth daily. Please make overdue yearly appt with Dr. Tamala Julian before anymore refills. 1st attempt  . benazepril (LOTENSIN) 10 MG tablet Take 0.5 tablets (5 mg total) by mouth daily.  . budesonide-formoterol (SYMBICORT) 160-4.5 MCG/ACT inhaler Inhale 2 puffs into the lungs 2 (two) times daily.  [name] buPROPion (WELLBUTRIN) 75 MG tablet Take 1 tablet (75 mg total) by mouth daily.  . busPIRone (BUSPAR) 15 MG tablet TAKE 1 TABLET BY MOUTH THREE TIMES A DAY  . carvedilol (COREG) 3.125 MG tablet Take 1 tablet (3.125 mg total) by mouth 2 (two) times daily.  . cetirizine (ZYRTEC) 10 MG tablet Take 10 mg by mouth daily as needed for allergies.  . Choline Fenofibrate (FENOFIBRIC ACID) 135 MG CPDR TAKE 1 CAPSULE BY MOUTH DAILY  . cyanocobalamin (,VITAMIN B-12,) 1000 MCG/ML injection Inject 1 mL (1,000 mcg total) into the muscle every 14 (fourteen) days.  [name] escitalopram (LEXAPRO) 20 MG tablet TAKE 1 TABLET(20 MG) BY MOUTH DAILY  . fluticasone (FLONASE) 50 MCG/ACT nasal spray Place 2 sprays into both nostrils  daily.  . furosemide (LASIX) 40 MG tablet TAKE 1/2 TABLET(20 MG) BY MOUTH DAILY  . gabapentin (NEURONTIN) 100 MG capsule TAKE 3 CAPSULES BY MOUTH TWICE DAILY  . HYDROcodone-acetaminophen (NORCO/VICODIN) 5-325 MG tablet Take 1 tablet by mouth. EVERY 4 TO 6 HOURS AS NEEDED PAIN  . Ipratropium-Albuterol (COMBIVENT RESPIMAT) 20-100 MCG/ACT AERS respimat Inhale 1 puff into the lungs every 6 (six) hours. Shortness of breath or wheezing  . Magnesium Oxide 400 MG CAPS Take 1 capsule (400 mg total) by mouth daily.  [name] mexiletine (MEXITIL) 200 MG capsule Take 1 capsule (200 mg total) 3 (three) times daily by mouth.  . nitroGLYCERIN (NITROSTAT) 0.4 MG SL tablet Place 1 tablet (  0.4 mg total) under the tongue every 5 (five) minutes x 3 doses as needed for chest pain.  [name] omeprazole (PRILOSEC) 20 MG capsule Take 20 mg by mouth 2 (two) times daily.  Vladimir Faster Glycol-Propyl Glycol (SYSTANE OP) Place 1 drop into both eyes daily as needed (dry eyes).  . potassium chloride SA (K-DUR,KLOR-CON) 20 MEQ tablet Take 1 tablet (20 mEq total) by mouth daily.  [name] Respiratory Therapy Supplies (FLUTTER) DEVI Use as directed  . Syringe/Needle, Disp, (SYRINGE 3CC/25GX5/8") 25G X 5/8" 3 ML MISC 1 each by Does not apply route every 14 (fourteen) days.  . tamsulosin (FLOMAX) 0.4 MG CAPS capsule TAKE 1 CAPSULE BY MOUTH EVERY DAY AFTER SUPPER  . Tiotropium Bromide Monohydrate (SPIRIVA RESPIMAT) 2.5 MCG/ACT AERS Inhale 2 puffs into the lungs daily.  Alveda Reasons 20 MG TABS tablet TAKE 1 TABLET BY MOUTH ONCE DAILY WITH SUPPER     Allergies: Allergies  Allergen Reactions  . Sulfa Antibiotics Hives  . Sulfasalazine Hives    Social History: The patient  reports that he quit smoking about 11 months ago. His smoking use included cigarettes. He has a 55.00 pack-year smoking history. He has never used smokeless tobacco. He reports that he drinks alcohol. He reports that he does not use drugs.   Family History: The patient's family  history includes CAD in his father and mother; Heart attack in his brother; Hypertension in his brother, father, and mother.   Review of Systems: Please see the history of present illness.   Otherwise, the review of systems is positive for none.   All other systems are reviewed and negative.   Physical Exam: VS:  BP 108/66 (BP Location: Left Arm, Patient Position: Sitting, Cuff Size: Normal)   Pulse 61   Ht 5\' 11"  (1.803 m)   Wt 218 lb 12.8 oz (99.2 kg)   BMI 30.52 kg/m  .  BMI Body mass index is 30.52 kg/m.  Wt Readings from Last 3 Encounters:  09/21/17 218 lb 12.8 oz (99.2 kg)  08/24/17 224 lb (101.6 kg)  06/21/17 224 lb (101.6 kg)    General: Pleasant. Chronically ill appearing but alert and in no acute distress.   HEENT: Normal.  Neck: Supple, no JVD, carotid bruits, or masses noted.  Cardiac: Regular rate and rhythm. No murmurs, rubs, or gallops. No edema.  Respiratory:  Lungs are clear to auscultation bilaterally with normal work of breathing.  GI: Soft and nontender.  MS: No deformity or atrophy. Gait and ROM intact.  Skin: Warm and dry. Color is normal.  Neuro:  Strength and sensation are intact and no gross focal deficits noted.  Psych: Alert, appropriate and with normal affect.   LABORATORY DATA:  EKG:  EKG is ordered today. This demonstrates NSR with nonspecific changes.   Lab Results  Component Value Date   WBC 5.5 08/24/2017   HGB 9.6 (L) 08/24/2017   HCT 30.3 (L) 08/24/2017   PLT 268.0 08/24/2017   GLUCOSE 103 (H) 06/16/2017   CHOL 146 12/29/2016   TRIG 83.0 12/29/2016   HDL 52.00 12/29/2016   LDLCALC 77 12/29/2016   ALT 24 12/29/2016   AST 19 12/29/2016   NA 137 06/16/2017   K 4.1 06/16/2017   CL 103 06/16/2017   CREATININE 1.21 06/16/2017   BUN 12 06/16/2017   CO2 25 06/16/2017   TSH 2.660 04/29/2016   INR 2.47 09/14/2016   HGBA1C 5.6 12/29/2016     BNP (last 3 results) No results for  input(s): BNP in the last 8760 hours.  ProBNP (last 3  results) No results for input(s): PROBNP in the last 8760 hours.   Other Studies Reviewed Today:  Echo Study Conclusions 08/2016  - Left ventricle: The cavity size was mildly dilated. There was   mild concentric hypertrophy. Systolic function was mildly to   moderately reduced. The estimated ejection fraction was in the   range of 40% to 45%. Hypokinesis of the basal-mid inferior,   inferoseptal, anteroseptal, and inferolateral myocardium Doppler   parameters are consistent with abnormal left ventricular   relaxation (grade 1 diastolic dysfunction). Doppler parameters   are consistent with indeterminate ventricular filling pressure. - Aortic valve: Transvalvular velocity was within the normal range.   There was no stenosis. There was no regurgitation. - Ascending aorta: The ascending aorta was mildly dilated. - Mitral valve: There was trivial regurgitation. - Left atrium: The atrium was severely dilated. - Right ventricle: The cavity size was normal. Wall thickness was   normal. Systolic function was normal. - Right atrium: The atrium was severely dilated. - Atrial septum: No defect or patent foramen ovale was identified   by color flow Doppler. - Tricuspid valve: There was no regurgitation.   Assessment/Plan:  1. Ischemic CM - stable on his current regimen.   2. VT - on amiodarone and Mexitil. Needs labs today.   3. Underlying ICD  4. PAF - in NSR today  5. Chronic systolic HF - stable NYHA II symptoms. Weight is down.   6. HTN - BP ok - some positional orthostasis - he is going to go back to wearing support stockings.   7. HLD - statin refilled. Lab today.    Current medicines are reviewed with the patient today.  The patient does not have concerns regarding medicines other than what has been noted above.  The following changes have been made:  See above.  Labs/ tests ordered today include:    Orders Placed This Encounter  Procedures  . Basic metabolic panel    . CBC  . Hepatic function panel  . Lipid panel  . TSH  . Magnesium  . EKG 12-Lead     Disposition:   FU with Dr. Tamala Julian in 6 months and Dr. Lovena Le as planned.   Patient is agreeable to this plan and will call if any problems develop in the interim.   SignedTruitt Merle, NP  09/21/2017 3:35 PM  [facility] [address] [place], [place]  [address] Phone: [phone] Fax: [phone]

## 2017-09-22 LAB — BASIC METABOLIC PANEL
BUN/Creatinine Ratio: 9 — ABNORMAL LOW (ref 10–24)
BUN: 11 mg/dL (ref 8–27)
CO2: 25 mmol/L (ref 20–29)
Calcium: 9.5 mg/dL (ref 8.6–10.2)
Chloride: 103 mmol/L (ref 96–106)
Creatinine, Ser: 1.27 mg/dL (ref 0.76–1.27)
GFR calc Af Amer: 62 mL/min/{1.73_m2} (ref >59)
GFR calc non Af Amer: 54 mL/min/{1.73_m2} — ABNORMAL LOW (ref >59)
Glucose: 88 mg/dL (ref 65–99)
Potassium: 4.4 mmol/L (ref 3.5–5.2)
Sodium: 141 mmol/L (ref 134–144)

## 2017-09-22 LAB — CBC
Hematocrit: 33 % — ABNORMAL LOW (ref 37.5–51.0)
Hemoglobin: 10.1 g/dL — ABNORMAL LOW (ref 13.0–17.7)
MCH: 24.7 pg — ABNORMAL LOW (ref 26.6–33.0)
MCHC: 30.6 g/dL — ABNORMAL LOW (ref 31.5–35.7)
MCV: 81 fL (ref 79–97)
Platelets: 223 10*3/uL (ref 150–450)
RBC: 4.09 x10E6/uL — ABNORMAL LOW (ref 4.14–5.80)
RDW: 20.5 % — ABNORMAL HIGH (ref 12.3–15.4)
WBC: 4.6 10*3/uL (ref 3.4–10.8)

## 2017-09-22 LAB — LIPID PANEL
Chol/HDL Ratio: 3.4 ratio (ref 0.0–5.0)
Cholesterol, Total: 157 mg/dL (ref 100–199)
HDL: 46 mg/dL (ref >39)
LDL Calculated: 95 mg/dL (ref 0–99)
Triglycerides: 79 mg/dL (ref 0–149)
VLDL Cholesterol Cal: 16 mg/dL (ref 5–40)

## 2017-09-22 LAB — HEPATIC FUNCTION PANEL
ALT: 15 IU/L (ref 0–44)
AST: 17 IU/L (ref 0–40)
Albumin: 4.5 g/dL (ref 3.5–4.8)
Alkaline Phosphatase: 34 IU/L — ABNORMAL LOW (ref 39–117)
Bilirubin Total: 0.4 mg/dL (ref 0.0–1.2)
Bilirubin, Direct: 0.18 mg/dL (ref 0.00–0.40)
Total Protein: 6.9 g/dL (ref 6.0–8.5)

## 2017-09-22 LAB — TSH: TSH: 1.97 u[IU]/mL (ref 0.450–4.500)

## 2017-09-22 LAB — MAGNESIUM: Magnesium: 1.9 mg/dL (ref 1.6–2.3)

## 2017-09-29 ENCOUNTER — Telehealth: Payer: Self-pay | Admitting: Cardiology

## 2017-09-29 ENCOUNTER — Ambulatory Visit (INDEPENDENT_AMBULATORY_CARE_PROVIDER_SITE_OTHER): Payer: Medicare Other | Admitting: *Deleted

## 2017-09-29 DIAGNOSIS — I472 Ventricular tachycardia, unspecified: Secondary | ICD-10-CM

## 2017-09-29 NOTE — Telephone Encounter (Signed)
LMOVM reminding pt to send remote transmission.   

## 2017-10-01 ENCOUNTER — Encounter: Payer: Self-pay | Admitting: Cardiology

## 2017-10-01 ENCOUNTER — Encounter: Payer: Self-pay | Admitting: Internal Medicine

## 2017-10-01 NOTE — Progress Notes (Signed)
Remote ICD transmission.   

## 2017-10-04 MED ORDER — GABAPENTIN 300 MG PO CAPS: 300.0000 mg | ORAL_CAPSULE | Freq: Two times a day (BID) | ORAL | 2 refills | Status: DC

## 2017-10-04 NOTE — Telephone Encounter (Signed)
Spoke with pt informed him that transmission was received and was normal pt voiced understanding.

## 2017-10-08 ENCOUNTER — Other Ambulatory Visit: Payer: Self-pay | Admitting: Interventional Cardiology

## 2017-10-08 NOTE — Telephone Encounter (Signed)
Outpatient Medication Detail    Disp Refills Start End   atorvastatin (LIPITOR) 80 MG tablet 90 tablet 3 09/21/2017    Sig - Route: Take 1 tablet (80 mg total) by mouth daily. - Oral   Sent to pharmacy as: atorvastatin (LIPITOR) 80 MG tablet   E-Prescribing Status: Receipt confirmed by pharmacy (09/21/2017 3:41 PM EDT)   Pharmacy   [id number] - [place], [place] - [address] AT [address]

## 2017-10-14 ENCOUNTER — Other Ambulatory Visit: Payer: Self-pay | Admitting: Internal Medicine

## 2017-10-19 ENCOUNTER — Other Ambulatory Visit: Payer: Self-pay | Admitting: Internal Medicine

## 2017-10-19 MED ORDER — MAGNESIUM OXIDE 400 MG PO CAPS: 400.0000 mg | ORAL_CAPSULE | Freq: Every day | ORAL | 3 refills | Status: DC

## 2017-10-19 MED ORDER — POTASSIUM CHLORIDE CRYS ER 20 MEQ PO TBCR: 20.0000 meq | EXTENDED_RELEASE_TABLET | Freq: Every day | ORAL | 3 refills | Status: DC

## 2017-10-21 ENCOUNTER — Encounter: Payer: Self-pay | Admitting: Family

## 2017-10-21 ENCOUNTER — Ambulatory Visit (INDEPENDENT_AMBULATORY_CARE_PROVIDER_SITE_OTHER)
Admission: RE | Admit: 2017-10-21 | Discharge: 2017-10-21 | Disposition: A | Discharge status: Home / Self Care | Payer: Medicare Other | Source: Ambulatory Visit | Attending: Family | Admitting: Family

## 2017-10-21 ENCOUNTER — Ambulatory Visit (INDEPENDENT_AMBULATORY_CARE_PROVIDER_SITE_OTHER): Payer: Medicare Other | Admitting: Family

## 2017-10-21 VITALS — BP 104/70 | HR 62 | Temp 98.3°F | Ht 71.0 in | Wt 214.6 lb

## 2017-10-21 DIAGNOSIS — M542 Cervicalgia: Secondary | ICD-10-CM | POA: Diagnosis not present

## 2017-10-21 MED ORDER — METHOCARBAMOL 500 MG PO TABS: 500.0000 mg | ORAL_TABLET | Freq: Three times a day (TID) | ORAL | 0 refills | Status: DC | PRN

## 2017-10-21 NOTE — Progress Notes (Signed)
Steven Guzman is a 77 y.o. male with the following history as recorded in EpicCare:  Patient Active Problem List   Diagnosis Date Noted  . B12 deficiency 08/25/2017  . Anemia 06/10/2017  . Left leg pain 04/09/2017  . Right ankle pain 04/09/2017  . Left arm pain 04/09/2017  . BPH (benign prostatic hyperplasia) 12/31/2016  . Arrhythmia 09/15/2016  . Dizzinesses 05/22/2016  . Pancreatitis, chronic ([place]) 08/02/2015  . Mass of throat 06/30/2015  . Depression 05/24/2015  . Atrial fibrillation ([place]) 03/15/2015  . Routine general medical examination at a health care facility 05/12/2014  . Hemoptysis 03/16/2014  . COPD GOLD GRADE C 12/25/2013  . Chronic systolic heart failure ([place]) 07/07/2013  . CAD (coronary artery disease) 11/23/2012  . Cigarette smoker 11/23/2012  . Ventricular tachycardia ([place]) 11/23/2012  . Essential hypertension 11/23/2012  . Hyperlipidemia 11/23/2012  . ICD (implantable cardioverter-defibrillator) in place 11/23/2012    Current Outpatient Medications  Medication Sig Dispense Refill  . albuterol (PROVENTIL) (2.5 MG/3ML) 0.083% nebulizer solution Take 3 mLs (2.5 mg total) by nebulization every 6 (six) hours as needed for wheezing or shortness of breath. 360 mL 12  . amiodarone (PACERONE) 200 MG tablet Take 1 tablet (200 mg total) by mouth daily. 90 tablet 2  . atorvastatin (LIPITOR) 80 MG tablet Take 1 tablet (80 mg total) by mouth daily. 90 tablet 3  . benazepril (LOTENSIN) 10 MG tablet Take 0.5 tablets (5 mg total) by mouth daily. 45 tablet 3  . budesonide-formoterol (SYMBICORT) 160-4.5 MCG/ACT inhaler Inhale 2 puffs into the lungs 2 (two) times daily. 1 Inhaler 5  . buPROPion (WELLBUTRIN) 75 MG tablet Take 1 tablet (75 mg total) by mouth daily. 90 tablet 1  . busPIRone (BUSPAR) 15 MG tablet TAKE 1 TABLET BY MOUTH THREE TIMES A DAY 90 tablet 1  . carvedilol (COREG) 3.125 MG tablet Take 1 tablet (3.125 mg total) by mouth 2 (two) times daily. 180 tablet 0  .  cetirizine (ZYRTEC) 10 MG tablet Take 10 mg by mouth daily as needed for allergies.    . Choline Fenofibrate (FENOFIBRIC ACID) 135 MG CPDR TAKE 1 CAPSULE BY MOUTH DAILY 30 capsule 6  . cyanocobalamin (,VITAMIN B-12,) 1000 MCG/ML injection Inject 1 mL (1,000 mcg total) into the muscle every 14 (fourteen) days. 6 mL 3  . escitalopram (LEXAPRO) 20 MG tablet TAKE 1 TABLET(20 MG) BY MOUTH DAILY 90 tablet 1  . fluticasone (FLONASE) 50 MCG/ACT nasal spray Place 2 sprays into both nostrils daily.    . furosemide (LASIX) 40 MG tablet TAKE 1/2 TABLET(20 MG) BY MOUTH DAILY 45 tablet 2  . gabapentin (NEURONTIN) 300 MG capsule Take 1 capsule (300 mg total) by mouth 2 (two) times daily. 180 capsule 2  . Ipratropium-Albuterol (COMBIVENT RESPIMAT) 20-100 MCG/ACT AERS respimat Inhale 1 puff into the lungs every 6 (six) hours. Shortness of breath or wheezing 1 Inhaler 3  . Magnesium Oxide 400 MG CAPS Take 1 capsule (400 mg total) by mouth daily. 90 capsule 3  . mexiletine (MEXITIL) 200 MG capsule Take 1 capsule (200 mg total) 3 (three) times daily by mouth. 270 capsule 3  . nitroGLYCERIN (NITROSTAT) 0.4 MG SL tablet Place 1 tablet (0.4 mg total) under the tongue every 5 (five) minutes x 3 doses as needed for chest pain. 25 tablet 6  . omeprazole (PRILOSEC) 20 MG capsule Take 20 mg by mouth 2 (two) times daily.    Vladimir Faster Glycol-Propyl Glycol (SYSTANE OP) Place 1 drop into  both eyes daily as needed (dry eyes).    . potassium chloride SA (K-DUR,KLOR-CON) 20 MEQ tablet Take 1 tablet (20 mEq total) by mouth daily. 90 tablet 3  . Respiratory Therapy Supplies (FLUTTER) DEVI Use as directed 1 each 0  . Syringe/Needle, Disp, (SYRINGE 3CC/25GX5/8") 25G X 5/8" 3 ML MISC 1 each by Does not apply route every 14 (fourteen) days. 50 each 0  . tamsulosin (FLOMAX) 0.4 MG CAPS capsule TAKE 1 CAPSULE BY MOUTH EVERY DAY AFTER SUPPER 30 capsule 3  . Tiotropium Bromide Monohydrate (SPIRIVA RESPIMAT) 2.5 MCG/ACT AERS Inhale 2 puffs  into the lungs daily. 1 Inhaler 3  . XARELTO 20 MG TABS tablet TAKE 1 TABLET BY MOUTH ONCE DAILY WITH SUPPER 30 tablet 10  . methocarbamol (ROBAXIN) 500 MG tablet Take 1 tablet (500 mg total) by mouth every 8 (eight) hours as needed. 30 tablet 0   No current facility-administered medications for this visit.     Allergies: Sulfa antibiotics and Sulfasalazine  Past Medical History:  Diagnosis Date  . AICD (automatic cardioverter/defibrillator) present   . Aneurysm ([place])    a. Aneurysmal infrarenal aorta up to 33 mm on CT 10/2014, recommended f/u due 10/2017  . Anginal pain ([place])   . Anxiety   . Basal cell carcinoma of nose    S/P MOHS  . Biliary acute pancreatitis   . CAD (coronary artery disease)    a. s/p MI in 1994 with PCI to LAD at that time b. cath 10/2012 demonstrated EF 30%, inferior akinesis with mild hypokinesis of all walls, patent LAD and RCA stents; ostial PDA with 80-90% obstruction with medical therapy recommended   . Chronic systolic CHF (congestive heart failure) (HCC)    EF 30 to 35 % as of 09/2014.   [name] CKD (chronic kidney disease), stage III ([place])   . Complication of anesthesia 10/2014   "had to have defibrillator w/ERCP"  . COPD (chronic obstructive pulmonary disease) ([place])    a. followed by pulmonary, COPD GOLD stage II  . Depression   . Diverticulosis of colon 07/2014   noted on CT  . GERD (gastroesophageal reflux disease)   . Hiatal hernia   . Hyperglycemia 10/2012.  [name] Hyperlipidemia   . Hypertension   . Myocardial infarction [facility]) 1994; 2011  . Pneumonia 1946; 2015  . Prostate enlargement 07/2014   observed on CT  . Tobacco abuse   . Ventricular tachycardia ([place])    a. 08/2009 s/p BSX E110 Teligen 100 AICD, ser#: [id number];  b. 08/2008 VT req ATP - detection reprogrammed from 160 to 150. c. EPS and VT ablation by Dr. Lovena Le 12/21/2014    Past Surgical History:  Procedure Laterality Date  . CATARACT EXTRACTION W/ INTRAOCULAR LENS  IMPLANT, BILATERAL Bilateral ~ 2011   . COLONOSCOPY    . ELECTROPHYSIOLOGIC STUDY N/A 12/21/2014   Procedure: V Tach Ablation;  Surgeon: [name], MD;  Location: [facility];  Service: Cardiovascular;  Laterality: N/A;  . ERCP N/A 11/16/2014   Procedure: ENDOSCOPIC RETROGRADE CHOLANGIOPANCREATOGRAPHY (ERCP);  Surgeon: [name], MD;  Location: [facility];  Service: Endoscopy;  Laterality: N/A;  . EYE SURGERY    . FOOT SURGERY Left 2005   "fixed bone that stuck out in my ankle area"  . HEMORRHOID BANDING    . IMPLANTABLE CARDIOVERTER DEFIBRILLATOR IMPLANT  09/06/09   BSX dual chamber ICD implanted in [place] for cardiac arrest 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  . INGUINAL HERNIA REPAIR  Right ~ 1995  . LEFT HEART CATHETERIZATION WITH CORONARY ANGIOGRAM N/A 11/25/2012   demonstrated EF 30%, inferior akinesis with mild hypokinesis of all walls, patent LAD and RCA stents; ostial PDA with 80-90% obstruction with medical therapy recommended  . MOHS SURGERY  2008   nose, skin graft  . RETINAL DETACHMENT SURGERY Right 2013  . TENOLYSIS Right 12/21/2013   Procedure: TENOLYSIS FLEXOR CARPI RADIALIS ,DEBRIDEMENT RIGHT JOINT WRIST,DEBRIDEMENT SCAPHOTRAPEZIAL TRAPEZOID, REPAIR OF EXTENSOR HOOD;  Surgeon: Daryll Brod, MD;  Location: [facility];  Service: Orthopedics;  Laterality: Right;  . V-TACH ABLATION  12/21/2014  . VIDEO BRONCHOSCOPY Bilateral 01/09/2016   Procedure: VIDEO BRONCHOSCOPY WITHOUT FLUORO;  Surgeon: Juanito Doom, MD;  [facility];  Service: Cardiopulmonary;  Laterality: Bilateral;    Family History  Problem Relation Age of Onset  . Heart attack Brother   . CAD Father   . Hypertension Father   . CAD Mother   . Hypertension Mother   . Hypertension Brother   . Stroke Neg Hx     Social History   Tobacco Use  . Smoking status: Former Smoker    Packs/day: 1.00    Years: 55.00    Pack years: 55.00    Types: Cigarettes    Last attempt to quit: 10/12/2016    Years since quitting:  1.0  . Smokeless tobacco: Never Used  . Tobacco comment: had quit, recently bought a pack  Substance Use Topics  . Alcohol use: Yes    Alcohol/week: 0.0 oz    Comment: 12/21/2014 "might have a couple glasses of wine/year"    Subjective:  Left sided neck/ upper shoulder pain x 2 days; denies any injury or trauma; no numbness or tingling radiating into fingertips; tried using old Ibuprofen 800 mg and Hydrocodone with no benefit; did get some relief with warm water in the shower; difficult time finding comfortable position when trying to sleep;   Objective:  Vitals:   10/21/17 1359  BP: 104/70  Pulse: 62  Temp: 98.3 F (36.8 C)  TempSrc: Oral  SpO2: 96%  Weight: 214 lb 9.8 oz (97.3 kg)  Height: 5\' 11"  (1.803 m)    General: Well developed, well nourished, in no acute distress  Skin : Warm and dry.  Head: Normocephalic and atraumatic  Lungs: Respirations unlabored;  Musculoskeletal: No deformities; no active joint inflammation  Extremities: No edema, cyanosis, clubbing  Vessels: Symmetric bilaterally  Neurologic: Alert and oriented; speech intact; face symmetrical; moves all extremities well; CNII-XII intact without focal deficit  Assessment:  1. Neck pain     Plan:  Update cervical X-ray; trial of muscle relaxer; already taking Gabapentin; cannot use NSAIDs due to other medications he takes for cardiac needs; follow-up to be determined- cannot have MRI due to pacemaker/ may need CT or ultrasound;    No follow-ups on file.  Orders Placed This Encounter  Procedures  . DG Cervical Spine 2 or 3 views    Standing Status:   Future    Number of Occurrences:   1    Standing Expiration Date:   12/23/2018    Order Specific Question:   Reason for Exam (SYMPTOM  OR DIAGNOSIS REQUIRED)    Answer:   neck pain    Order Specific Question:   Preferred imaging location?    Answer:   [facility]    Order Specific Question:   Radiology Contrast Protocol - do NOT remove file path     Answer:   \\  [email or web address]    Requested Prescriptions   Signed Prescriptions Disp Refills  . methocarbamol (ROBAXIN) 500 MG tablet 30 tablet 0    Sig: Take 1 tablet (500 mg total) by mouth every 8 (eight) hours as needed.

## 2017-10-25 NOTE — Progress Notes (Signed)
[name] [address] [place], [place] [address] Phone: [phone] Subjective:    I'm seeing this patient by the request  of: Jodi Mourning nurse practitioner  CC: Neck pain  [id number]  Steven Guzman is a 77 y.o. male coming in with complaint of neck pain for one week. Has used a muscle relaxer to help relieve his pain. Pain on left side of neck. Denies any radiating symptoms. Tightness into his left shoulder. Felt some neck weakness over the weekend. Purchased a cervical collar to wear as he feels "weak."  Patient states that it is some fairly severe.  Rates severity of pain is 7 out of 10.  Patient did have x-rays taken October 21, 2017.  These were independently visualized by me.  Found to have diffuse multilevel degenerative changes in multiple levels of the cervical spine most pronounced at C5-C6 patient also found to have carotid vascular disease    Past Medical History:  Diagnosis Date  . AICD (automatic cardioverter/defibrillator) present   . Aneurysm ([place])    a. Aneurysmal infrarenal aorta up to 33 mm on CT 10/2014, recommended f/u due 10/2017  . Anginal pain ([place])   . Anxiety   . Basal cell carcinoma of nose    S/P MOHS  . Biliary acute pancreatitis   . CAD (coronary artery disease)    a. s/p MI in 1994 with PCI to LAD at that time b. cath 10/2012 demonstrated EF 30%, inferior akinesis with mild hypokinesis of all walls, patent LAD and RCA stents; ostial PDA with 80-90% obstruction with medical therapy recommended   . Chronic systolic CHF (congestive heart failure) (HCC)    EF 30 to 35 % as of 09/2014.   [name] CKD (chronic kidney disease), stage III ([place])   . Complication of anesthesia 10/2014   "had to have defibrillator w/ERCP"  . COPD (chronic obstructive pulmonary disease) ([place])    a. followed by pulmonary, COPD GOLD stage II  . Depression   . Diverticulosis of colon 07/2014   noted on CT  . GERD (gastroesophageal reflux disease)   .  Hiatal hernia   . Hyperglycemia 10/2012.  [name] Hyperlipidemia   . Hypertension   . Myocardial infarction [facility]) 1994; 2011  . Pneumonia 1946; 2015  . Prostate enlargement 07/2014   observed on CT  . Tobacco abuse   . Ventricular tachycardia ([place])    a. 08/2009 s/p BSX E110 Teligen 100 AICD, ser#: [id number];  b. 08/2008 VT req ATP - detection reprogrammed from 160 to 150. c. EPS and VT ablation by Dr. Lovena Le 12/21/2014   Past Surgical History:  Procedure Laterality Date  . CATARACT EXTRACTION W/ INTRAOCULAR LENS  IMPLANT, BILATERAL Bilateral ~ 2011  . COLONOSCOPY    . ELECTROPHYSIOLOGIC STUDY N/A 12/21/2014   Procedure: V Tach Ablation;  Surgeon: [name], MD;  Location: [facility];  Service: Cardiovascular;  Laterality: N/A;  . ERCP N/A 11/16/2014   Procedure: ENDOSCOPIC RETROGRADE CHOLANGIOPANCREATOGRAPHY (ERCP);  Surgeon: [name], MD;  Location: [facility];  Service: Endoscopy;  Laterality: N/A;  . EYE SURGERY    . FOOT SURGERY Left 2005   "fixed bone that stuck out in my ankle area"  . HEMORRHOID BANDING    . IMPLANTABLE CARDIOVERTER DEFIBRILLATOR IMPLANT  09/06/09   BSX dual chamber ICD implanted in [place] for cardiac arrest 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  . INGUINAL HERNIA REPAIR Right ~ 1995  . LEFT HEART CATHETERIZATION  WITH CORONARY ANGIOGRAM N/A 11/25/2012   demonstrated EF 30%, inferior akinesis with mild hypokinesis of all walls, patent LAD and RCA stents; ostial PDA with 80-90% obstruction with medical therapy recommended  . MOHS SURGERY  2008   nose, skin graft  . RETINAL DETACHMENT SURGERY Right 2013  . TENOLYSIS Right 12/21/2013   Procedure: TENOLYSIS FLEXOR CARPI RADIALIS ,DEBRIDEMENT RIGHT JOINT WRIST,DEBRIDEMENT SCAPHOTRAPEZIAL TRAPEZOID, REPAIR OF EXTENSOR HOOD;  Surgeon: Daryll Brod, MD;  Location: [facility];  Service: Orthopedics;  Laterality: Right;  . V-TACH ABLATION  12/21/2014  . VIDEO BRONCHOSCOPY Bilateral 01/09/2016   Procedure: VIDEO  BRONCHOSCOPY WITHOUT FLUORO;  Surgeon: Juanito Doom, MD;  [facility];  Service: Cardiopulmonary;  Laterality: Bilateral;   Social History   Socioeconomic History  . Marital status: Married    Spouse name: Not on file  . Number of children: Not on file  . Years of education: Not on file  . Highest education level: Not on file  Occupational History  . Occupation: Retired  Scientific laboratory technician  . Financial resource strain: Not on file  . Food insecurity:    Worry: Not on file    Inability: Not on file  . Transportation needs:    Medical: Not on file    Non-medical: Not on file  Tobacco Use  . Smoking status: Former Smoker    Packs/day: 1.00    Years: 55.00    Pack years: 55.00    Types: Cigarettes    Last attempt to quit: 10/12/2016    Years since quitting: 1.0  . Smokeless tobacco: Never Used  . Tobacco comment: had quit, recently bought a pack  Substance and Sexual Activity  . Alcohol use: Yes    Alcohol/week: 0.0 oz    Comment: 12/21/2014 "might have a couple glasses of wine/year"  . Drug use: No  . Sexual activity: Not Currently  Lifestyle  . Physical activity:    Days per week: Not on file    Minutes per session: Not on file  . Stress: Not on file  Relationships  . Social connections:    Talks on phone: Not on file    Gets together: Not on file    Attends religious service: Not on file    Active member of club or organization: Not on file    Attends meetings of clubs or organizations: Not on file    Relationship status: Not on file  Other Topics Concern  . Not on file  Social History Narrative  . Not on file   Allergies  Allergen Reactions  . Sulfa Antibiotics Hives  . Sulfasalazine Hives   Family History  Problem Relation Age of Onset  . Heart attack Brother   . CAD Father   . Hypertension Father   . CAD Mother   . Hypertension Mother   . Hypertension Brother   . Stroke Neg Hx      Past medical history, social, surgical and family  history all reviewed in electronic medical record.  No pertanent information unless stated regarding to the chief complaint.   Review of Systems:Review of systems updated and as accurate as of 10/27/17  No  visual changes, nausea, vomiting, diarrhea, constipation, dizziness, abdominal pain, skin rash, fevers, chills, night sweats, weight loss, swollen lymph nodes, body aches, joint swelling chest pain, shortness of breath, mood changes.  Positive headaches, muscle aches  Objective  Blood pressure 98/72, pulse 66, height 5\' 11"  (1.803 m), weight 214 lb (97.1 kg), SpO2  95 %. Systems examined below as of 10/27/17   General: No apparent distress alert and oriented x3 mood and affect normal, dressed appropriately.  HEENT: Pupils equal, extraocular movements intact  Respiratory: Patient's speak in full sentences and does not appear short of breath  Cardiovascular: No lower extremity edema, non tender, no erythema  Skin: Warm dry intact with no signs of infection or rash on extremities or on axial skeleton.  Abdomen: Soft nontender  Neuro: Cranial nerves II through XII are intact, neurovascularly intact in all extremities with 2+ DTRs and 2+ pulses.  Lymph: No lymphadenopathy of posterior or anterior cervical chain or axillae bilaterally.  Gait antalgic MSK: Mild tender with full range of motion and good stability and symmetric strength and tone of shoulders, elbows, wrist, hip, knee and ankles bilaterally.  Changes of multiple joints  Neck exam shows severe loss of lordosis.  Patient has minimal sidebending bilaterally and there is some crepitus with rotation.  Carotid bruit is noted on the right side.  Patient unable to tolerate the Spurling's maneuver secondary to pain unable to side bend.  Neurovascularly intact distally.  Does have arthritic changes of the hands which complicates patient's physical findings    Impression and Recommendations:     This case required medical decision making of  moderate complexity.      Note: This dictation was prepared with Dragon dictation along with smaller phrase technology. Any transcriptional errors that result from this process are unintentional.

## 2017-10-27 ENCOUNTER — Ambulatory Visit (INDEPENDENT_AMBULATORY_CARE_PROVIDER_SITE_OTHER): Payer: Medicare Other | Admitting: Family Medicine

## 2017-10-27 ENCOUNTER — Encounter: Payer: Self-pay | Admitting: Family Medicine

## 2017-10-27 DIAGNOSIS — I25810 Atherosclerosis of coronary artery bypass graft(s) without angina pectoris: Secondary | ICD-10-CM

## 2017-10-27 DIAGNOSIS — M4802 Spinal stenosis, cervical region: Secondary | ICD-10-CM | POA: Diagnosis not present

## 2017-10-27 DIAGNOSIS — R0989 Other specified symptoms and signs involving the circulatory and respiratory systems: Secondary | ICD-10-CM | POA: Insufficient documentation

## 2017-10-27 DIAGNOSIS — D649 Anemia, unspecified: Secondary | ICD-10-CM

## 2017-10-27 LAB — CUP PACEART REMOTE DEVICE CHECK
Battery Remaining Percentage: 57 %
Brady Statistic RA Percent Paced: 23 %
Brady Statistic RV Percent Paced: 0 %
HIGH POWER IMPEDANCE MEASURED VALUE: 52 Ohm
Implantable Lead Implant Date: 20110610
Implantable Lead Location: 753860
Implantable Lead Model: 4135
Implantable Lead Serial Number: 339643
Lead Channel Impedance Value: 459 Ohm
Lead Channel Impedance Value: 600 Ohm
Lead Channel Pacing Threshold Amplitude: 0.9 V
Lead Channel Pacing Threshold Pulse Width: 0.4 ms
Lead Channel Pacing Threshold Pulse Width: 0.4 ms
Lead Channel Setting Pacing Amplitude: 2 V
Lead Channel Setting Sensing Sensitivity: 0.6 mV
MDC IDC LEAD IMPLANT DT: 20110610
MDC IDC LEAD LOCATION: 753859
MDC IDC LEAD SERIAL: 28681386
MDC IDC MSMT BATTERY REMAINING LONGEVITY: 48 mo
MDC IDC MSMT LEADCHNL RV PACING THRESHOLD AMPLITUDE: 1 V
MDC IDC PG IMPLANT DT: 20110610
MDC IDC SESS DTM: 20190703190400
MDC IDC SET LEADCHNL RA PACING AMPLITUDE: 2 V
MDC IDC SET LEADCHNL RV PACING PULSEWIDTH: 0.4 ms
Pulse Gen Serial Number: 164892

## 2017-10-27 NOTE — Assessment & Plan Note (Signed)
Right sided.  

## 2017-10-27 NOTE — Assessment & Plan Note (Signed)
Moderate to severe degenerative changes of the neck noted on x-ray.  No concern for more of a spinal stenosis.  Patient though has elected to try conservative therapy.  Patient is on gabapentin 300 mg twice a day.  Other concerns I have this patient does have a carotid bruit noted.  Significant calcifications noted on x-ray and ultrasound ordered today.  Reviewing patient's labs patient was also found to have severe anemia and encourage patient to potentially follow-up with gastroenterology.  Patient has chronic systolic heart failure that is concerning.  History of chronic pancreatitis though make me concerned for an underlying alcohol.  Patient denied that today.  Patient given other suggestions of over-the-counter medications that could be beneficial.  Follow-up again in 4 weeks

## 2017-10-27 NOTE — Patient Instructions (Signed)
Good to see you  Ice 20 minutes 2 times daily. Usually after activity and before bed. pennsaid pinkie amount topically 2 times daily as needed.  Exercises 3 times a week.  Tylenol 325mg  3 times a day  Vitamin D 2000 IU daily  Tart cherry extract any dose  We will get ultrasound of the neck  I would talk to Dr. Henrene Pastor as well on the iron  May need to consider hematology as well  See me again in 6 weeks

## 2017-10-27 NOTE — Assessment & Plan Note (Signed)
Hemoglobin appears to be dropping and severe iron deficiency.  Patient has a gastroenterologist and will follow up with him.  Concerning also may need hematology continues to have difficulty.  Discussed the possibility of a reticulocyte count.

## 2017-10-28 ENCOUNTER — Encounter: Payer: Self-pay | Admitting: Family Medicine

## 2017-11-01 NOTE — Telephone Encounter (Signed)
Pt calling to check status on receiving exercises. [phone]

## 2017-11-10 ENCOUNTER — Other Ambulatory Visit: Payer: Self-pay | Admitting: Internal Medicine

## 2017-11-10 DIAGNOSIS — I480 Paroxysmal atrial fibrillation: Secondary | ICD-10-CM

## 2017-11-11 NOTE — Telephone Encounter (Signed)
Pt last saw Truitt Merle on 09/21/17, last labs 09/21/17 Creat 1.27, age 77, weight 97.1kg, CrCl 65.87, based on CrCl pt is on appropriate dosage of Xarelto 20mg  QD.  Will refill rx.

## 2017-12-06 ENCOUNTER — Other Ambulatory Visit: Payer: Self-pay | Admitting: Internal Medicine

## 2017-12-07 NOTE — Progress Notes (Signed)
[facility] [address] [place], [place] [address] Phone: [phone] Subjective:    I Steven Guzman am serving as a Education administrator for Dr. Hulan Saas.   I'm seeing this patient by the request  of:    CC: Neck pain follow-up  MEQ:[id number]  Steven Guzman is a 77 y.o. male coming in with complaint of neck pain. States that he's feeling the same as the last visit.  Found to have severe osteoarthritic changes.  Patient has many different comorbidities.  Continues to have stomach pain.  Last follow-up was supposed to follow-up with gastroenterology for patient's chronic anemia and weight loss.  Has no improvement.  Patient continues to have difficulty with keeping his weight.  States that the neck pain is worsening.  Waking him up at night.  Some radiation down the arms.     Past Medical History:  Diagnosis Date  . AICD (automatic cardioverter/defibrillator) present   . Aneurysm ([place])    a. Aneurysmal infrarenal aorta up to 33 mm on CT 10/2014, recommended f/u due 10/2017  . Anginal pain ([place])   . Anxiety   . Basal cell carcinoma of nose    S/P MOHS  . Biliary acute pancreatitis   . CAD (coronary artery disease)    a. s/p MI in 1994 with PCI to LAD at that time b. cath 10/2012 demonstrated EF 30%, inferior akinesis with mild hypokinesis of all walls, patent LAD and RCA stents; ostial PDA with 80-90% obstruction with medical therapy recommended   . Chronic systolic CHF (congestive heart failure) (HCC)    EF 30 to 35 % as of 09/2014.   [name] CKD (chronic kidney disease), stage III ([place])   . Complication of anesthesia 10/2014   "had to have defibrillator w/ERCP"  . COPD (chronic obstructive pulmonary disease) ([place])    a. followed by pulmonary, COPD GOLD stage II  . Depression   . Diverticulosis of colon 07/2014   noted on CT  . GERD (gastroesophageal reflux disease)   . Hiatal hernia   . Hyperglycemia 10/2012.  [name] Hyperlipidemia   . Hypertension   . Myocardial  infarction [facility]) 1994; 2011  . Pneumonia 1946; 2015  . Prostate enlargement 07/2014   observed on CT  . Tobacco abuse   . Ventricular tachycardia ([place])    a. 08/2009 s/p BSX E110 Teligen 100 AICD, ser#: [id number];  b. 08/2008 VT req ATP - detection reprogrammed from 160 to 150. c. EPS and VT ablation by Dr. Lovena Le 12/21/2014   Past Surgical History:  Procedure Laterality Date  . CATARACT EXTRACTION W/ INTRAOCULAR LENS  IMPLANT, BILATERAL Bilateral ~ 2011  . COLONOSCOPY    . ELECTROPHYSIOLOGIC STUDY N/A 12/21/2014   Procedure: V Tach Ablation;  Surgeon: [name], MD;  Location: [facility];  Service: Cardiovascular;  Laterality: N/A;  . ERCP N/A 11/16/2014   Procedure: ENDOSCOPIC RETROGRADE CHOLANGIOPANCREATOGRAPHY (ERCP);  Surgeon: [name], MD;  Location: [facility];  Service: Endoscopy;  Laterality: N/A;  . EYE SURGERY    . FOOT SURGERY Left 2005   "fixed bone that stuck out in my ankle area"  . HEMORRHOID BANDING    . IMPLANTABLE CARDIOVERTER DEFIBRILLATOR IMPLANT  09/06/09   BSX dual chamber ICD implanted in [place] for cardiac arrest 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  . INGUINAL HERNIA REPAIR Right ~ 1995  . LEFT HEART CATHETERIZATION WITH CORONARY ANGIOGRAM N/A 11/25/2012   demonstrated EF 30%, inferior akinesis with mild  hypokinesis of all walls, patent LAD and RCA stents; ostial PDA with 80-90% obstruction with medical therapy recommended  . MOHS SURGERY  2008   nose, skin graft  . RETINAL DETACHMENT SURGERY Right 2013  . TENOLYSIS Right 12/21/2013   Procedure: TENOLYSIS FLEXOR CARPI RADIALIS ,DEBRIDEMENT RIGHT JOINT WRIST,DEBRIDEMENT SCAPHOTRAPEZIAL TRAPEZOID, REPAIR OF EXTENSOR HOOD;  Surgeon: Daryll Brod, MD;  Location: [facility];  Service: Orthopedics;  Laterality: Right;  . V-TACH ABLATION  12/21/2014  . VIDEO BRONCHOSCOPY Bilateral 01/09/2016   Procedure: VIDEO BRONCHOSCOPY WITHOUT FLUORO;  Surgeon: Juanito Doom, MD;  [facility];  Service:  Cardiopulmonary;  Laterality: Bilateral;   Social History   Socioeconomic History  . Marital status: Married    Spouse name: Not on file  . Number of children: Not on file  . Years of education: Not on file  . Highest education level: Not on file  Occupational History  . Occupation: Retired  Scientific laboratory technician  . Financial resource strain: Not on file  . Food insecurity:    Worry: Not on file    Inability: Not on file  . Transportation needs:    Medical: Not on file    Non-medical: Not on file  Tobacco Use  . Smoking status: Former Smoker    Packs/day: 1.00    Years: 55.00    Pack years: 55.00    Types: Cigarettes    Last attempt to quit: 10/12/2016    Years since quitting: 1.1  . Smokeless tobacco: Never Used  . Tobacco comment: had quit, recently bought a pack  Substance and Sexual Activity  . Alcohol use: Yes    Alcohol/week: 0.0 standard drinks    Comment: 12/21/2014 "might have a couple glasses of wine/year"  . Drug use: No  . Sexual activity: Not Currently  Lifestyle  . Physical activity:    Days per week: Not on file    Minutes per session: Not on file  . Stress: Not on file  Relationships  . Social connections:    Talks on phone: Not on file    Gets together: Not on file    Attends religious service: Not on file    Active member of club or organization: Not on file    Attends meetings of clubs or organizations: Not on file    Relationship status: Not on file  Other Topics Concern  . Not on file  Social History Narrative  . Not on file   Allergies  Allergen Reactions  . Sulfa Antibiotics Hives  . Sulfasalazine Hives   Family History  Problem Relation Age of Onset  . Heart attack Brother   . CAD Father   . Hypertension Father   . CAD Mother   . Hypertension Mother   . Hypertension Brother   . Stroke Neg Hx      Current Outpatient Medications (Cardiovascular):  .  amiodarone (PACERONE) 200 MG tablet, Take 1 tablet (200 mg total) by mouth daily. [name]   atorvastatin (LIPITOR) 80 MG tablet, Take 1 tablet (80 mg total) by mouth daily. .  benazepril (LOTENSIN) 10 MG tablet, Take 0.5 tablets (5 mg total) by mouth daily. .  carvedilol (COREG) 3.125 MG tablet, TAKE 1 TABLET(3.125 MG) BY MOUTH TWICE DAILY .  Choline Fenofibrate (FENOFIBRIC ACID) 135 MG CPDR, TAKE 1 CAPSULE BY MOUTH DAILY .  furosemide (LASIX) 40 MG tablet, TAKE 1/2 TABLET(20 MG) BY MOUTH DAILY .  mexiletine (MEXITIL) 200 MG capsule, Take 1 capsule (200 mg total)  3 (three) times daily by mouth. .  nitroGLYCERIN (NITROSTAT) 0.4 MG SL tablet, Place 1 tablet (0.4 mg total) under the tongue every 5 (five) minutes x 3 doses as needed for chest pain.  Current Outpatient Medications (Respiratory):  .  albuterol (PROVENTIL) (2.5 MG/3ML) 0.083% nebulizer solution, Take 3 mLs (2.5 mg total) by nebulization every 6 (six) hours as needed for wheezing or shortness of breath. .  budesonide-formoterol (SYMBICORT) 160-4.5 MCG/ACT inhaler, Inhale 2 puffs into the lungs 2 (two) times daily. .  cetirizine (ZYRTEC) 10 MG tablet, Take 10 mg by mouth daily as needed for allergies. .  fluticasone (FLONASE) 50 MCG/ACT nasal spray, Place 2 sprays into both nostrils daily. .  Ipratropium-Albuterol (COMBIVENT RESPIMAT) 20-100 MCG/ACT AERS respimat, Inhale 1 puff into the lungs every 6 (six) hours. Shortness of breath or wheezing .  Tiotropium Bromide Monohydrate (SPIRIVA RESPIMAT) 2.5 MCG/ACT AERS, Inhale 2 puffs into the lungs daily.   Current Outpatient Medications (Hematological):  .  cyanocobalamin (,VITAMIN B-12,) 1000 MCG/ML injection, Inject 1 mL (1,000 mcg total) into the muscle every 14 (fourteen) days. .  Iron-Vitamin C (IRON 100/C) 100-250 MG TABS, Take by mouth. Alveda Reasons 20 MG TABS tablet, TAKE 1 TABLET BY MOUTH EVERY DAY WITH SUPPER  Current Outpatient Medications (Other):  [name]  buPROPion (WELLBUTRIN) 75 MG tablet, Take 1 tablet (75 mg total) by mouth daily. .  busPIRone (BUSPAR) 15 MG tablet,  TAKE 1 TABLET BY MOUTH THREE TIMES A DAY .  cholecalciferol (VITAMIN D) 1000 units tablet, Take 2,000 Units by mouth daily. [name]  escitalopram (LEXAPRO) 20 MG tablet, TAKE 1 TABLET(20 MG) BY MOUTH DAILY .  gabapentin (NEURONTIN) 300 MG capsule, Take 1 capsule (300 mg total) by mouth 2 (two) times daily. .  Magnesium Oxide 400 MG CAPS, Take 1 capsule (400 mg total) by mouth daily. .  methocarbamol (ROBAXIN) 500 MG tablet, Take 1 tablet (500 mg total) by mouth every 8 (eight) hours as needed. [name]  omeprazole (PRILOSEC) 20 MG capsule, Take 20 mg by mouth 2 (two) times daily. Vladimir Faster Glycol-Propyl Glycol (SYSTANE OP), Place 1 drop into both eyes daily as needed (dry eyes). .  potassium chloride SA (K-DUR,KLOR-CON) 20 MEQ tablet, Take 1 tablet (20 mEq total) by mouth daily. [name]  Respiratory Therapy Supplies (FLUTTER) DEVI, Use as directed .  Syringe/Needle, Disp, (SYRINGE 3CC/25GX5/8") 25G X 5/8" 3 ML MISC, 1 each by Does not apply route every 14 (fourteen) days. .  tamsulosin (FLOMAX) 0.4 MG CAPS capsule, TAKE 1 CAPSULE BY MOUTH EVERY DAY AFTER SUPPER    Past medical history, social, surgical and family history all reviewed in electronic medical record.  No pertanent information unless stated regarding to the chief complaint.   Review of Systems:  No headache, visual changes, nausea, vomiting, diarrhea, constipation, dizziness, abdominal pain, skin rash, fevers, chills, night sweats,  chest pain, shortness of breath, mood changes.  Positive muscle aches, joint swelling, body aches weight loss  Objective  Blood pressure 100/70, pulse (!) 59, height 5\' 11"  (1.803 m), weight 206 lb (93.4 kg), SpO2 93 %.    General: No apparent distress alert and oriented x3 mood and affect normal, dressed appropriately.  HEENT: Pupils equal, extraocular movements intact  Respiratory: Patient's speak in full sentences and does not appear short of breath  Cardiovascular: Trace lower extremity edema, non tender, no  erythema  Skin: Warm dry intact with no signs of infection or rash on extremities or on axial skeleton.  Significant number of contusions on the skin with varying degrees of healing. Abdomen: Soft diffusely tender though.  Voluntary guarding in the epigastric region Neuro: Cranial nerves II through XII are intact, neurovascularly intact in all extremities with 2+ DTRs and 2+ pulses.  Lymph: No lymphadenopathy of posterior or anterior cervical chain or axillae bilaterally.  Gait normal with good balance and coordination.  MSK:  Non tender with full range of motion and good stability and symmetric strength and tone of shoulders, elbows, wrist, hip, knee and ankles bilaterally.  Significant arthritic changes Neck exam shows loss of lordosis.  Has only 5 degrees of sidebending bilaterally.  Positive pain with extension of greater than 5 degrees.  Unable to tolerate the Spurling's test again.  Arthritic changes of the hands but grip strength is 4 out of 5 and symmetric.   Impression and Recommendations:     This case required medical decision making of moderate complexity. The above documentation has been reviewed and is accurate and complete Lyndal Pulley, DO       Note: This dictation was prepared with Dragon dictation along with smaller phrase technology. Any transcriptional errors that result from this process are unintentional.

## 2017-12-08 ENCOUNTER — Encounter: Payer: Self-pay | Admitting: Family Medicine

## 2017-12-08 ENCOUNTER — Ambulatory Visit (INDEPENDENT_AMBULATORY_CARE_PROVIDER_SITE_OTHER): Payer: Medicare Other | Admitting: Family Medicine

## 2017-12-08 ENCOUNTER — Other Ambulatory Visit: Payer: Self-pay | Admitting: Internal Medicine

## 2017-12-08 VITALS — BP 100/70 | HR 59 | Ht 71.0 in | Wt 206.0 lb

## 2017-12-08 DIAGNOSIS — R634 Abnormal weight loss: Secondary | ICD-10-CM | POA: Diagnosis not present

## 2017-12-08 DIAGNOSIS — K219 Gastro-esophageal reflux disease without esophagitis: Secondary | ICD-10-CM | POA: Diagnosis not present

## 2017-12-08 DIAGNOSIS — M4802 Spinal stenosis, cervical region: Secondary | ICD-10-CM

## 2017-12-08 DIAGNOSIS — R42 Dizziness and giddiness: Secondary | ICD-10-CM

## 2017-12-08 DIAGNOSIS — I25810 Atherosclerosis of coronary artery bypass graft(s) without angina pectoris: Secondary | ICD-10-CM | POA: Diagnosis not present

## 2017-12-08 DIAGNOSIS — I5022 Chronic systolic (congestive) heart failure: Secondary | ICD-10-CM

## 2017-12-08 NOTE — Patient Instructions (Signed)
Good to see you  I want you to see a stomach doc and I hope they call you  We will get an ultrasound of your neck as well to check the blood flow.  Trigger point injection given today  Ice is your friend Tylenol 500mg  daily  If worsening pain in 2 weeks then call and we will need a CT or MRI of the neck and consider injections.  See me again in 5-8 weeks otherwise

## 2017-12-08 NOTE — Assessment & Plan Note (Signed)
Symptoms significant with congenital spinal stenosis.  Patient though does have some odd findings.  Significant amount of vascular complaints over the course of time.  Patient likely is a high risk surgical candidate and would not likely have any surgery for multiple reasons.  Patient could be the possibility of a epidural injections but we would need advanced imaging.  Patient wants to hold on this at this moment.  We discussed the possibility of formal physical therapy and patient will continue to try the home exercises on a more regular basis that he has done so far.  We discussed some of his other comorbidities including the anemia or B12 deficiency should be seen by hematologist.  Patient will consider it.  Abdominal pain he is having he will follow-up with the gastroenterologist to see if there is any reason for some of his weight loss.  Patient then will follow-up with me again in 4 to 6 weeks.  Spent  25 minutes with patient face-to-face and had greater than 50% of counseling including as described above in assessment and plan.

## 2017-12-10 ENCOUNTER — Telehealth: Payer: Self-pay

## 2017-12-10 ENCOUNTER — Encounter: Payer: Self-pay | Admitting: Internal Medicine

## 2017-12-10 ENCOUNTER — Other Ambulatory Visit (INDEPENDENT_AMBULATORY_CARE_PROVIDER_SITE_OTHER): Payer: Medicare Other

## 2017-12-10 ENCOUNTER — Ambulatory Visit (INDEPENDENT_AMBULATORY_CARE_PROVIDER_SITE_OTHER): Payer: Medicare Other | Admitting: Internal Medicine

## 2017-12-10 VITALS — BP 94/60 | HR 56 | Ht 70.5 in | Wt 205.0 lb

## 2017-12-10 DIAGNOSIS — M549 Dorsalgia, unspecified: Secondary | ICD-10-CM | POA: Diagnosis not present

## 2017-12-10 DIAGNOSIS — K219 Gastro-esophageal reflux disease without esophagitis: Secondary | ICD-10-CM

## 2017-12-10 DIAGNOSIS — I25810 Atherosclerosis of coronary artery bypass graft(s) without angina pectoris: Secondary | ICD-10-CM

## 2017-12-10 DIAGNOSIS — R634 Abnormal weight loss: Secondary | ICD-10-CM

## 2017-12-10 DIAGNOSIS — D509 Iron deficiency anemia, unspecified: Secondary | ICD-10-CM | POA: Diagnosis not present

## 2017-12-10 DIAGNOSIS — Z7901 Long term (current) use of anticoagulants: Secondary | ICD-10-CM

## 2017-12-10 LAB — BASIC METABOLIC PANEL
BUN: 11 mg/dL (ref 6–23)
CALCIUM: 9.4 mg/dL (ref 8.4–10.5)
CO2: 27 mEq/L (ref 19–32)
Chloride: 104 mEq/L (ref 96–112)
Creatinine, Ser: 1.05 mg/dL (ref 0.40–1.50)
GFR: 72.4 mL/min (ref >60.00)
GLUCOSE: 94 mg/dL (ref 70–99)
Potassium: 3.9 mEq/L (ref 3.5–5.1)
SODIUM: 140 meq/L (ref 135–145)

## 2017-12-10 MED ORDER — NA SULFATE-K SULFATE-MG SULF 17.5-3.13-1.6 GM/177ML PO SOLN: 1.0000 | Freq: Once | ORAL | 0 refills | Status: AC

## 2017-12-10 NOTE — Progress Notes (Signed)
HISTORY OF PRESENT ILLNESS:  Steven Guzman is a 77 y.o. male with MULTIPLE SIGNIFICANT ADVANCED medical problems including but not limited to coronary artery disease, congestive heart failure with ejection fraction less than 35%, history of ventricular tachycardia status post ablation an implantable defibrillator placement, that COPD with ongoing tobacco abuse, chronic renal insufficiency, hypertension, hyperlipidemia, abdominal aortic aneurysm, basal cell carcinoma, and biliary pancreatitis. He was last evaluated in this office 09/17/2015 regarding possible surveillance colonoscopy. At that time he was asymptomatic with normal IMA globin and not felt to be an appropriate candidate for routine surveillance. Previous examination 2012 with diminutive polyps in [place]. He is sent today by Dr. Tamala Julian regarding anemia. He was found to have a hemoglobin of 9.6 with low ferritin and low B12 levels. He was placed on replacement therapy a few months ago. He is on chronic Xarelto. He does take chronic PPI. He has been having problems with left upper back pain for one year. Etiology not determined. She does have chronic reflux which is managed with PPI therapy. Currently 40 mg of omeprazole daily. Most significant issue is 20 pound weight lossover the past several months which is unexplained. No change in appetite. He continues to smoke. His electrophysiologist is Dr. Lovena Le.[name] He did submit Hemoccult studies which were negative 6  REVIEW OF SYSTEMS:  All non-GI ROS negative, less otherwise stated in the history of present illness, except for anxiety, arthritis, back pain, depression, fatigue, night sweats, shortness of breath, lower extremity swelling  Past Medical History:  Diagnosis Date  . AICD (automatic cardioverter/defibrillator) present   . Aneurysm ([place])    a. Aneurysmal infrarenal aorta up to 33 mm on CT 10/2014, recommended f/u due 10/2017  . Anginal pain ([place])   . Anxiety   . Basal cell carcinoma  of nose    S/P MOHS  . Biliary acute pancreatitis   . CAD (coronary artery disease)    a. s/p MI in 1994 with PCI to LAD at that time b. cath 10/2012 demonstrated EF 30%, inferior akinesis with mild hypokinesis of all walls, patent LAD and RCA stents; ostial PDA with 80-90% obstruction with medical therapy recommended   . Chronic systolic CHF (congestive heart failure) (HCC)    EF 30 to 35 % as of 09/2014.   [name] CKD (chronic kidney disease), stage III ([place])   . Complication of anesthesia 10/2014   "had to have defibrillator w/ERCP"  . COPD (chronic obstructive pulmonary disease) ([place])    a. followed by pulmonary, COPD GOLD stage II  . Depression   . Diverticulosis of colon 07/2014   noted on CT  . GERD (gastroesophageal reflux disease)   . Hiatal hernia   . Hyperglycemia 10/2012.  [name] Hyperlipidemia   . Hypertension   . Myocardial infarction [facility]) 1994; 2011  . Pneumonia 1946; 2015  . Prostate enlargement 07/2014   observed on CT  . Tobacco abuse   . Ventricular tachycardia ([place])    a. 08/2009 s/p BSX E110 Teligen 100 AICD, ser#: [id number];  b. 08/2008 VT req ATP - detection reprogrammed from 160 to 150. c. EPS and VT ablation by Dr. Lovena Le 12/21/2014    Past Surgical History:  Procedure Laterality Date  . CATARACT EXTRACTION W/ INTRAOCULAR LENS  IMPLANT, BILATERAL Bilateral ~ 2011  . COLONOSCOPY    . ELECTROPHYSIOLOGIC STUDY N/A 12/21/2014   Procedure: V Tach Ablation;  Surgeon: [name], MD;  Location: [place] [facility];  Service: Cardiovascular;  Laterality: N/A;  .  ERCP N/A 11/16/2014   Procedure: ENDOSCOPIC RETROGRADE CHOLANGIOPANCREATOGRAPHY (ERCP);  Surgeon: [name], MD;  Location: [place];  Service: Endoscopy;  Laterality: N/A;  . EYE SURGERY    . FOOT SURGERY Left 2005   "fixed bone that stuck out in my ankle area"  . HEMORRHOID BANDING    . IMPLANTABLE CARDIOVERTER DEFIBRILLATOR IMPLANT  09/06/09   BSX dual chamber ICD implanted in [place] for cardiac arrest and  inducible VT at EPS  . INGUINAL HERNIA REPAIR Right ~ 1995  . LEFT HEART CATHETERIZATION WITH CORONARY ANGIOGRAM N/A 11/25/2012   demonstrated EF 30%, inferior akinesis with mild hypokinesis of all walls, patent LAD and RCA stents; ostial PDA with 80-90% obstruction with medical therapy recommended  . MOHS SURGERY  2008   nose, skin graft  . RETINAL DETACHMENT SURGERY Right 2013  . TENOLYSIS Right 12/21/2013   Procedure: TENOLYSIS FLEXOR CARPI RADIALIS ,DEBRIDEMENT RIGHT JOINT WRIST,DEBRIDEMENT SCAPHOTRAPEZIAL TRAPEZOID, REPAIR OF EXTENSOR HOOD;  Surgeon: Daryll Brod, MD;  Location: [place];  Service: Orthopedics;  Laterality: Right;  . V-TACH ABLATION  12/21/2014  . VIDEO BRONCHOSCOPY Bilateral 01/09/2016   Procedure: VIDEO BRONCHOSCOPY WITHOUT FLUORO;  Surgeon: Juanito Doom, MD;  Location: [facility];  Service: Cardiopulmonary;  Laterality: Bilateral;    Social History Steven Guzman  reports that he quit smoking about 13 months ago. His smoking use included cigarettes. He has a 55.00 pack-year smoking history. He has never used smokeless tobacco. He reports that he drinks alcohol. He reports that he does not use drugs.  family history includes CAD in his father and mother; Heart attack in his brother; Hypertension in his brother, father, and mother.  Allergies  Allergen Reactions  . Sulfa Antibiotics Hives  . Sulfasalazine Hives       PHYSICAL EXAMINATION: Vital signs: BP 94/60 (BP Location: Left Arm, Patient Position: Sitting, Cuff Size: Normal)   Pulse (!) 56   Ht 5' 10.5" (1.791 m)   Wt 205 lb (93 kg)   BMI 29.00 kg/m   Constitutional: generally well-appearing, no acute distress Psychiatric: alert and oriented x3, cooperative Eyes: extraocular movements intact, anicteric, conjunctiva pink Mouth: oral pharynx moist, no lesions Neck: supple no lymphadenopathy Cardiovascular: heart regular rate and rhythm, no murmur Lungs: clear to auscultation  bilaterally Abdomen: soft, nontender, nondistended, no obvious ascites, no peritoneal signs, normal bowel sounds, no organomegaly Rectal: omitted Extremities: no clubbing or cyanosis. 1+ lower extremity edema bilaterally Skin:multiple substantial ecchymoses on visible extremities, upper and lower Neuro: No focal deficits. Cranial nerves intact   ASSESSMENT:  #1. Iron deficiency anemia. Rule out significant GI mucosal pathology #2. Unexplained weight loss. Rule out underlying malignancy #3. Multiple significant medical problems including advanced cardiac disease and chronic anticoagulation therapy #4. Chronic GERD. Managed with daily PPI #5. History of adenomatous colon polyps. Last exam 2012 elsewhere   PLAN:  #1. CT scan of the chest abdomen and pelvis to rule out underlying malignancy in a smoker with iron deficiency anemia and B12 deficiency #2. If CT unrevealing then proceed with colonoscopy and upper endoscopy. Patient is very high risk given his comorbidities and the need to address his anticoagulation. The procedures will need to be performed at the hospital with monitored anesthesia care.The nature of the procedure, as well as the risks, benefits, and alternatives were carefully and thoroughly reviewed with the patient. Ample time for discussion and questions allowed. The patient understood, was satisfied, and agreed to proceed. #3. Hold Xarelto 2 days prior  to the procedures. We will check with Dr. Lovena Le to see if this is acceptable

## 2017-12-10 NOTE — Patient Instructions (Signed)
Your provider has requested that you go to the basement level for lab work before leaving today. Press "B" on the elevator. The lab is located at the first door on the left as you exit the elevator.  You have been scheduled for a CT scan of the abdomen and pelvis, and chest at [facility] ([address]---this is in the same building as [facility]).   You are scheduled on 12/16/2017 at 4:00pm. You should arrive 15 minutes prior to your appointment time for registration. Please follow the written instructions below on the day of your exam:  WARNING: IF YOU ARE ALLERGIC TO IODINE/X-RAY DYE, PLEASE NOTIFY RADIOLOGY IMMEDIATELY AT [id number]! YOU WILL BE GIVEN A 13 HOUR PREMEDICATION PREP.  1) Do not eat anything after 12:00pm (4 hours prior to your test) 2) You have been given 2 bottles of oral contrast to drink. The solution may taste               better if refrigerated, but do NOT add ice or any other liquid to this solution. Shake             well before drinking.    Drink 1 bottle of contrast @ 2:00pm (2 hours prior to your exam)  Drink 1 bottle of contrast @ 3:00pm (1 hour prior to your exam)  You may take any medications as prescribed with a small amount of water except for the following: Metformin, Glucophage, Glucovance, Avandamet, Riomet, Fortamet, Actoplus Met, Janumet, Glumetza or Metaglip. The above medications must be held the day of the exam AND 48 hours after the exam.  The purpose of you drinking the oral contrast is to aid in the visualization of your intestinal tract. The contrast solution may cause some diarrhea. Before your exam is started, you will be given a small amount of fluid to drink. Depending on your individual set of symptoms, you may also receive an intravenous injection of x-ray contrast/dye. Plan on being at [facility] for 30 minutes or long, depending on the type of exam you are having performed.  If you have any questions regarding  your exam or if you need to reschedule, you may call the CT department at [phone] between the hours of 8:00 am and 5:00 pm, Monday-Friday.  You have been scheduled for an endoscopy and colonoscopy. Please follow the written instructions given to you at your visit today. Please pick up your prep supplies at the pharmacy within the next 1-3 days. If you use inhalers (even only as needed), please bring them with you on the day of your procedure. Your physician has requested that you go to www.[email or web address].com and enter the access code given to you at your visit today. This web site gives a general overview about your procedure. However, you should still follow specific instructions given to you by our office regarding your preparation for the procedure.  ________________________________________________________________________

## 2017-12-10 NOTE — Telephone Encounter (Signed)
 [facility] Pre-operative Risk Assessment     Request for surgical clearance:     Endoscopy Procedure  What type of surgery is being performed?     Endoscopy/colonoscopy  When is this surgery scheduled?     12/21/2017  What type of clearance is required ?   Pharmacy  Are there any medications that need to be held prior to surgery and how long?  Xarelto - 2 days  Practice name and name of physician performing surgery?      [facility]  What is your office phone and fax number?      Phone- [phone]  Fax519 [phone]  Anesthesia type (None, local, MAC, general) ?       MAC

## 2017-12-10 NOTE — Telephone Encounter (Signed)
Patient with diagnosis of Afib on Xarelto for anticoagulation.    Procedure: endoscopy Date of procedure: 12/21/17  CHADS2-VASc score of  5 (CHF, HTN, AGE, DM2, stroke/tia x 2, CAD, AGE, male)  CrCl 56ml/min  Per office protocol, patient can hold Xarelto for 24 hours prior to procedure.

## 2017-12-10 NOTE — H&P (View-Only) (Signed)
HISTORY OF PRESENT ILLNESS:  Steven Guzman is a 77 y.o. male with MULTIPLE SIGNIFICANT ADVANCED medical problems including but not limited to coronary artery disease, congestive heart failure with ejection fraction less than 35%, history of ventricular tachycardia status post ablation an implantable defibrillator placement, that COPD with ongoing tobacco abuse, chronic renal insufficiency, hypertension, hyperlipidemia, abdominal aortic aneurysm, basal cell carcinoma, and biliary pancreatitis. He was last evaluated in this office 09/17/2015 regarding possible surveillance colonoscopy. At that time he was asymptomatic with normal IMA globin and not felt to be an appropriate candidate for routine surveillance. Previous examination 2012 with diminutive polyps in [place]. He is sent today by Dr. Tamala Julian regarding anemia. He was found to have a hemoglobin of 9.6 with low ferritin and low B12 levels. He was placed on replacement therapy a few months ago. He is on chronic Xarelto. He does take chronic PPI. He has been having problems with left upper back pain for one year. Etiology not determined. She does have chronic reflux which is managed with PPI therapy. Currently 40 mg of omeprazole daily. Most significant issue is 20 pound weight lossover the past several months which is unexplained. No change in appetite. He continues to smoke. His electrophysiologist is Dr. Lovena Le.[name] He did submit Hemoccult studies which were negative 6  REVIEW OF SYSTEMS:  All non-GI ROS negative, less otherwise stated in the history of present illness, except for anxiety, arthritis, back pain, depression, fatigue, night sweats, shortness of breath, lower extremity swelling  Past Medical History:  Diagnosis Date  . AICD (automatic cardioverter/defibrillator) present   . Aneurysm ([place])    a. Aneurysmal infrarenal aorta up to 33 mm on CT 10/2014, recommended f/u due 10/2017  . Anginal pain ([place])   . Anxiety   . Basal cell carcinoma  of nose    S/P MOHS  . Biliary acute pancreatitis   . CAD (coronary artery disease)    a. s/p MI in 1994 with PCI to LAD at that time b. cath 10/2012 demonstrated EF 30%, inferior akinesis with mild hypokinesis of all walls, patent LAD and RCA stents; ostial PDA with 80-90% obstruction with medical therapy recommended   . Chronic systolic CHF (congestive heart failure) (HCC)    EF 30 to 35 % as of 09/2014.   [name] CKD (chronic kidney disease), stage III ([place])   . Complication of anesthesia 10/2014   "had to have defibrillator w/ERCP"  . COPD (chronic obstructive pulmonary disease) ([place])    a. followed by pulmonary, COPD GOLD stage II  . Depression   . Diverticulosis of colon 07/2014   noted on CT  . GERD (gastroesophageal reflux disease)   . Hiatal hernia   . Hyperglycemia 10/2012.  [name] Hyperlipidemia   . Hypertension   . Myocardial infarction [facility]) 1994; 2011  . Pneumonia 1946; 2015  . Prostate enlargement 07/2014   observed on CT  . Tobacco abuse   . Ventricular tachycardia ([place])    a. 08/2009 s/p BSX E110 Teligen 100 AICD, ser#: [id number];  b. 08/2008 VT req ATP - detection reprogrammed from 160 to 150. c. EPS and VT ablation by Dr. Lovena Le 12/21/2014    Past Surgical History:  Procedure Laterality Date  . CATARACT EXTRACTION W/ INTRAOCULAR LENS  IMPLANT, BILATERAL Bilateral ~ 2011  . COLONOSCOPY    . ELECTROPHYSIOLOGIC STUDY N/A 12/21/2014   Procedure: V Tach Ablation;  Surgeon: [name], MD;  Location: [place] [facility];  Service: Cardiovascular;  Laterality: N/A;  .  ERCP N/A 11/16/2014   Procedure: ENDOSCOPIC RETROGRADE CHOLANGIOPANCREATOGRAPHY (ERCP);  Surgeon: [name], MD;  Location: [place];  Service: Endoscopy;  Laterality: N/A;  . EYE SURGERY    . FOOT SURGERY Left 2005   "fixed bone that stuck out in my ankle area"  . HEMORRHOID BANDING    . IMPLANTABLE CARDIOVERTER DEFIBRILLATOR IMPLANT  09/06/09   BSX dual chamber ICD implanted in [place] for cardiac arrest and  inducible VT at EPS  . INGUINAL HERNIA REPAIR Right ~ 1995  . LEFT HEART CATHETERIZATION WITH CORONARY ANGIOGRAM N/A 11/25/2012   demonstrated EF 30%, inferior akinesis with mild hypokinesis of all walls, patent LAD and RCA stents; ostial PDA with 80-90% obstruction with medical therapy recommended  . MOHS SURGERY  2008   nose, skin graft  . RETINAL DETACHMENT SURGERY Right 2013  . TENOLYSIS Right 12/21/2013   Procedure: TENOLYSIS FLEXOR CARPI RADIALIS ,DEBRIDEMENT RIGHT JOINT WRIST,DEBRIDEMENT SCAPHOTRAPEZIAL TRAPEZOID, REPAIR OF EXTENSOR HOOD;  Surgeon: Daryll Brod, MD;  Location: [facility];  Service: Orthopedics;  Laterality: Right;  . V-TACH ABLATION  12/21/2014  . VIDEO BRONCHOSCOPY Bilateral 01/09/2016   Procedure: VIDEO BRONCHOSCOPY WITHOUT FLUORO;  Surgeon: Juanito Doom, MD;  [facility];  Service: Cardiopulmonary;  Laterality: Bilateral;    Social History DENE NAZIR  reports that he quit smoking about 13 months ago. His smoking use included cigarettes. He has a 55.00 pack-year smoking history. He has never used smokeless tobacco. He reports that he drinks alcohol. He reports that he does not use drugs.  family history includes CAD in his father and mother; Heart attack in his brother; Hypertension in his brother, father, and mother.  Allergies  Allergen Reactions  . Sulfa Antibiotics Hives  . Sulfasalazine Hives       PHYSICAL EXAMINATION: Vital signs: BP 94/60 (BP Location: Left Arm, Patient Position: Sitting, Cuff Size: Normal)   Pulse (!) 56   Ht 5' 10.5" (1.791 m)   Wt 205 lb (93 kg)   BMI 29.00 kg/m   Constitutional: generally well-appearing, no acute distress Psychiatric: alert and oriented x3, cooperative Eyes: extraocular movements intact, anicteric, conjunctiva pink Mouth: oral pharynx moist, no lesions Neck: supple no lymphadenopathy Cardiovascular: heart regular rate and rhythm, no murmur Lungs: clear to auscultation  bilaterally Abdomen: soft, nontender, nondistended, no obvious ascites, no peritoneal signs, normal bowel sounds, no organomegaly Rectal: omitted Extremities: no clubbing or cyanosis. 1+ lower extremity edema bilaterally Skin:multiple substantial ecchymoses on visible extremities, upper and lower Neuro: No focal deficits. Cranial nerves intact   ASSESSMENT:  #1. Iron deficiency anemia. Rule out significant GI mucosal pathology #2. Unexplained weight loss. Rule out underlying malignancy #3. Multiple significant medical problems including advanced cardiac disease and chronic anticoagulation therapy #4. Chronic GERD. Managed with daily PPI #5. History of adenomatous colon polyps. Last exam 2012 elsewhere   PLAN:  #1. CT scan of the chest abdomen and pelvis to rule out underlying malignancy in a smoker with iron deficiency anemia and B12 deficiency #2. If CT unrevealing then proceed with colonoscopy and upper endoscopy. Patient is very high risk given his comorbidities and the need to address his anticoagulation. The procedures will need to be performed at the hospital with monitored anesthesia care.The nature of the procedure, as well as the risks, benefits, and alternatives were carefully and thoroughly reviewed with the patient. Ample time for discussion and questions allowed. The patient understood, was satisfied, and agreed to proceed. #3. Hold Xarelto 2 days prior  to the procedures. We will check with Dr. Lovena Le to see if this is acceptable

## 2017-12-12 ENCOUNTER — Other Ambulatory Visit: Payer: Self-pay | Admitting: Internal Medicine

## 2017-12-13 DIAGNOSIS — J320 Chronic maxillary sinusitis: Secondary | ICD-10-CM | POA: Diagnosis not present

## 2017-12-13 NOTE — Telephone Encounter (Signed)
Pt with hx of Vt on Mexiletine and milrinone and ICD, now in need of EGD  to hold anticoagulation as below

## 2017-12-16 ENCOUNTER — Other Ambulatory Visit: Payer: Medicare Other

## 2017-12-16 ENCOUNTER — Inpatient Hospital Stay: Admission: RE | Admit: 2017-12-16 | Payer: Medicare Other | Source: Ambulatory Visit

## 2017-12-17 ENCOUNTER — Ambulatory Visit (INDEPENDENT_AMBULATORY_CARE_PROVIDER_SITE_OTHER)
Admission: RE | Admit: 2017-12-17 | Discharge: 2017-12-17 | Disposition: A | Discharge status: Home / Self Care | Payer: Medicare Other | Source: Ambulatory Visit | Attending: Internal Medicine | Admitting: Internal Medicine

## 2017-12-17 ENCOUNTER — Telehealth: Payer: Self-pay

## 2017-12-17 ENCOUNTER — Inpatient Hospital Stay: Admission: RE | Admit: 2017-12-17 | Payer: Medicare Other | Source: Ambulatory Visit

## 2017-12-17 DIAGNOSIS — M549 Dorsalgia, unspecified: Secondary | ICD-10-CM | POA: Diagnosis not present

## 2017-12-17 DIAGNOSIS — R634 Abnormal weight loss: Secondary | ICD-10-CM

## 2017-12-17 DIAGNOSIS — K8020 Calculus of gallbladder without cholecystitis without obstruction: Secondary | ICD-10-CM | POA: Diagnosis not present

## 2017-12-17 DIAGNOSIS — D509 Iron deficiency anemia, unspecified: Secondary | ICD-10-CM | POA: Diagnosis not present

## 2017-12-17 DIAGNOSIS — J849 Interstitial pulmonary disease, unspecified: Secondary | ICD-10-CM | POA: Diagnosis not present

## 2017-12-17 MED ORDER — IOPAMIDOL (ISOVUE-300) INJECTION 61%
100.0000 mL | Freq: Once | INTRAVENOUS | Status: AC | PRN
  Administered 2017-12-17: 100 mL via INTRAVENOUS

## 2017-12-17 NOTE — Telephone Encounter (Signed)
Left message for patient that per Dr. Lovena Le he can hold his Xarelto for 24 hours prior to his procedure next week.  Asked him to call me back and leave a message that he received this information

## 2017-12-17 NOTE — Telephone Encounter (Signed)
Spoke with patient who confirmed he understood to hold his Xarelto for 24 hours prior to his procedure Tuesday.  He mentioned that he had a cold and was having a little trouble breathing.  I told him to watch it over the weekend and call back Monday if he was still sick and/or having trouble with his breathing.  Patient agreed.

## 2017-12-20 ENCOUNTER — Other Ambulatory Visit: Payer: Medicare Other

## 2017-12-21 ENCOUNTER — Encounter (HOSPITAL_COMMUNITY)
Admission: RE | Disposition: A | Discharge status: Home / Self Care | Payer: Self-pay | Source: Ambulatory Visit | Attending: Internal Medicine

## 2017-12-21 ENCOUNTER — Encounter (HOSPITAL_COMMUNITY): Payer: Self-pay | Admitting: Anesthesiology

## 2017-12-21 ENCOUNTER — Other Ambulatory Visit: Payer: Self-pay

## 2017-12-21 ENCOUNTER — Ambulatory Visit (HOSPITAL_COMMUNITY): Payer: Medicare Other | Admitting: Anesthesiology

## 2017-12-21 ENCOUNTER — Ambulatory Visit (HOSPITAL_COMMUNITY)
Admission: RE | Admit: 2017-12-21 | Discharge: 2017-12-21 | Disposition: A | Discharge status: Home / Self Care | Payer: Medicare Other | Source: Ambulatory Visit | Attending: Internal Medicine | Admitting: Internal Medicine

## 2017-12-21 DIAGNOSIS — D124 Benign neoplasm of descending colon: Secondary | ICD-10-CM | POA: Diagnosis not present

## 2017-12-21 DIAGNOSIS — Z79899 Other long term (current) drug therapy: Secondary | ICD-10-CM | POA: Insufficient documentation

## 2017-12-21 DIAGNOSIS — K621 Rectal polyp: Secondary | ICD-10-CM | POA: Diagnosis not present

## 2017-12-21 DIAGNOSIS — Z95810 Presence of automatic (implantable) cardiac defibrillator: Secondary | ICD-10-CM | POA: Insufficient documentation

## 2017-12-21 DIAGNOSIS — I2510 Atherosclerotic heart disease of native coronary artery without angina pectoris: Secondary | ICD-10-CM | POA: Diagnosis not present

## 2017-12-21 DIAGNOSIS — K222 Esophageal obstruction: Secondary | ICD-10-CM

## 2017-12-21 DIAGNOSIS — J449 Chronic obstructive pulmonary disease, unspecified: Secondary | ICD-10-CM | POA: Diagnosis not present

## 2017-12-21 DIAGNOSIS — Z1211 Encounter for screening for malignant neoplasm of colon: Secondary | ICD-10-CM | POA: Diagnosis not present

## 2017-12-21 DIAGNOSIS — K3189 Other diseases of stomach and duodenum: Secondary | ICD-10-CM | POA: Diagnosis not present

## 2017-12-21 DIAGNOSIS — K648 Other hemorrhoids: Secondary | ICD-10-CM | POA: Diagnosis not present

## 2017-12-21 DIAGNOSIS — I5022 Chronic systolic (congestive) heart failure: Secondary | ICD-10-CM | POA: Diagnosis not present

## 2017-12-21 DIAGNOSIS — Z85828 Personal history of other malignant neoplasm of skin: Secondary | ICD-10-CM | POA: Insufficient documentation

## 2017-12-21 DIAGNOSIS — Z7901 Long term (current) use of anticoagulants: Secondary | ICD-10-CM | POA: Insufficient documentation

## 2017-12-21 DIAGNOSIS — K449 Diaphragmatic hernia without obstruction or gangrene: Secondary | ICD-10-CM | POA: Diagnosis not present

## 2017-12-21 DIAGNOSIS — Z882 Allergy status to sulfonamides status: Secondary | ICD-10-CM | POA: Diagnosis not present

## 2017-12-21 DIAGNOSIS — K5730 Diverticulosis of large intestine without perforation or abscess without bleeding: Secondary | ICD-10-CM | POA: Diagnosis not present

## 2017-12-21 DIAGNOSIS — K219 Gastro-esophageal reflux disease without esophagitis: Secondary | ICD-10-CM

## 2017-12-21 DIAGNOSIS — D122 Benign neoplasm of ascending colon: Secondary | ICD-10-CM

## 2017-12-21 DIAGNOSIS — Z86010 Personal history of colonic polyps: Secondary | ICD-10-CM | POA: Insufficient documentation

## 2017-12-21 DIAGNOSIS — I252 Old myocardial infarction: Secondary | ICD-10-CM | POA: Insufficient documentation

## 2017-12-21 DIAGNOSIS — F419 Anxiety disorder, unspecified: Secondary | ICD-10-CM | POA: Insufficient documentation

## 2017-12-21 DIAGNOSIS — D509 Iron deficiency anemia, unspecified: Secondary | ICD-10-CM | POA: Diagnosis not present

## 2017-12-21 DIAGNOSIS — K2990 Gastroduodenitis, unspecified, without bleeding: Secondary | ICD-10-CM

## 2017-12-21 DIAGNOSIS — K612 Anorectal abscess: Secondary | ICD-10-CM | POA: Diagnosis not present

## 2017-12-21 DIAGNOSIS — F329 Major depressive disorder, single episode, unspecified: Secondary | ICD-10-CM | POA: Diagnosis not present

## 2017-12-21 DIAGNOSIS — K2970 Gastritis, unspecified, without bleeding: Secondary | ICD-10-CM

## 2017-12-21 DIAGNOSIS — Z955 Presence of coronary angioplasty implant and graft: Secondary | ICD-10-CM | POA: Insufficient documentation

## 2017-12-21 DIAGNOSIS — I130 Hypertensive heart and chronic kidney disease with heart failure and stage 1 through stage 4 chronic kidney disease, or unspecified chronic kidney disease: Secondary | ICD-10-CM | POA: Insufficient documentation

## 2017-12-21 DIAGNOSIS — E785 Hyperlipidemia, unspecified: Secondary | ICD-10-CM | POA: Insufficient documentation

## 2017-12-21 DIAGNOSIS — D123 Benign neoplasm of transverse colon: Secondary | ICD-10-CM

## 2017-12-21 DIAGNOSIS — Z8249 Family history of ischemic heart disease and other diseases of the circulatory system: Secondary | ICD-10-CM | POA: Insufficient documentation

## 2017-12-21 DIAGNOSIS — N183 Chronic kidney disease, stage 3 (moderate): Secondary | ICD-10-CM | POA: Insufficient documentation

## 2017-12-21 HISTORY — PX: ESOPHAGOGASTRODUODENOSCOPY (EGD) WITH PROPOFOL: SHX5813

## 2017-12-21 HISTORY — PX: COLONOSCOPY WITH PROPOFOL: SHX5780

## 2017-12-21 HISTORY — PX: POLYPECTOMY: SHX5525

## 2017-12-21 HISTORY — PX: BIOPSY: SHX5522

## 2017-12-21 SURGERY — ESOPHAGOGASTRODUODENOSCOPY (EGD) WITH PROPOFOL
Anesthesia: Monitor Anesthesia Care

## 2017-12-21 MED ORDER — ONDANSETRON HCL 4 MG/2ML IJ SOLN
INTRAMUSCULAR | Status: DC | PRN
  Administered 2017-12-21: 4 mg via INTRAVENOUS

## 2017-12-21 MED ORDER — PROPOFOL 500 MG/50ML IV EMUL
INTRAVENOUS | Status: DC | PRN
  Administered 2017-12-21: 30 mg via INTRAVENOUS

## 2017-12-21 MED ORDER — PROPOFOL 500 MG/50ML IV EMUL
INTRAVENOUS | Status: DC | PRN
  Administered 2017-12-21: 100 ug/kg/min via INTRAVENOUS

## 2017-12-21 MED ORDER — LACTATED RINGERS IV SOLN
INTRAVENOUS | Status: DC
  Administered 2017-12-21: 11:00:00 via INTRAVENOUS

## 2017-12-21 MED ORDER — LACTATED RINGERS IV SOLN: INTRAVENOUS | Status: DC

## 2017-12-21 SURGICAL SUPPLY — 25 items

## 2017-12-21 NOTE — Op Note (Signed)
[facility] Patient Name: Steven Guzman Procedure Date: 12/21/2017 MRN: [id number] Attending MD: Docia Chuck. Henrene Pastor , MD Date of Birth: Sep 10, 1940 CSN: [id number] Age: 77 Admit Type: Outpatient Procedure:                Colonoscopy with cold snare polypectomy x 11 Indications:              Iron deficiency anemia. history of adenomatous                            polyps elsewhere (2012) Providers:                Docia Chuck. Henrene Pastor, MD, Cleda Daub, RN, Elspeth Cho Tech., Technician, Arnoldo Hooker, CRNA Referring MD:              Medicines:                Monitored Anesthesia Care Complications:            No immediate complications. Estimated blood loss:                            None. Estimated Blood Loss:     Estimated blood loss: none. Procedure:                Pre-Anesthesia Assessment:                           - Prior to the procedure, a History and Physical                            was performed, and patient medications and                            allergies were reviewed. The patient's tolerance of                            previous anesthesia was also reviewed. The risks                            and benefits of the procedure and the sedation                            options and risks were discussed with the patient.                            All questions were answered, and informed consent                            was obtained. Prior Anticoagulants: The patient has                            taken Xarelto (rivaroxaban), last dose was 3 days  prior to procedure. ASA Grade Assessment: III - A                            patient with severe systemic disease. After                            reviewing the risks and benefits, the patient was                            deemed in satisfactory condition to undergo the                            procedure.                           After obtaining informed  consent, the colonoscope                            was passed under direct vision. Throughout the                            procedure, the patient's blood pressure, pulse, and                            oxygen saturations were monitored continuously. The                            CF-HQ190L ([id number]) Olympus adult colonoscope was                            introduced through the anus and advanced to the the                            cecum, identified by appendiceal orifice and                            ileocecal valve. The ileocecal valve, appendiceal                            orifice, and rectum were photographed. The quality                            of the bowel preparation was excellent. The                            colonoscopy was performed without difficulty. The                            patient tolerated the procedure well. The bowel                            preparation used was SUPREP. Scope In: 11:08:36 AM Scope Out: 11:42:32 AM Scope Withdrawal Time: 0 hours 26 minutes 18 seconds  Total Procedure Duration: 0 hours 33 minutes 56  seconds  Findings:      Ten polyps were found in the rectum, descending colon, transverse colon       and ascending colon. The polyps were 2 to 10 mm in size. These polyps       were removed with a cold snare. Resection and retrieval were complete.      Multiple diverticula were found in the left colon.      Internal hemorrhoids were found during retroflexion.      The exam was otherwise without abnormality on direct and retroflexion       views. Impression:               - Ten 2 to 10 mm polyps in the rectum, in the                            descending colon, in the transverse colon and in                            the ascending colon, removed with a cold snare.                            Resected and retrieved.                           - Diverticulosis in the left colon.                           - Internal hemorrhoids.                            - The examination was otherwise normal on direct                            and retroflexion views. Moderate Sedation:      none Recommendation:           - Repeat colonoscopy is not recommended for                            surveillance.                           - Resume Xarelto (rivaroxaban) tomorrow at prior                            dose.                           - Patient has a contact number available for                            emergencies. The signs and symptoms of potential                            delayed complications were discussed with the                            patient. Return  to normal activities tomorrow.                            Written discharge instructions were provided to the                            patient.                           - Resume previous diet.                           - Continue present medications.                           - Await pathology results. Procedure Code(s):        --- Professional ---                           [phone], Colonoscopy, flexible; with removal of                            tumor(s), polyp(s), or other lesion(s) by snare                            technique Diagnosis Code(s):        --- Professional ---                           K62.1, Rectal polyp                           D12.4, Benign neoplasm of descending colon                           D12.3, Benign neoplasm of transverse colon (hepatic                            flexure or splenic flexure)                           D12.2, Benign neoplasm of ascending colon                           K64.8, Other hemorrhoids                           D50.9, Iron deficiency anemia, unspecified                           K57.30, Diverticulosis of large intestine without                            perforation or abscess without bleeding CPT copyright 2017 American [employer]. All rights reserved. The codes documented in this report are preliminary and upon  coder review may  be revised to meet current compliance requirements. Docia Chuck. Henrene Pastor, MD 12/21/2017 11:48:52 AM This report has been signed electronically. Number  of Addenda: 0

## 2017-12-21 NOTE — Transfer of Care (Signed)
Immediate Anesthesia Transfer of Care Note  Patient: Steven Guzman  Procedure(s) Performed: Procedure(s): ESOPHAGOGASTRODUODENOSCOPY (EGD) WITH PROPOFOL (N/A) COLONOSCOPY WITH PROPOFOL (N/A) POLYPECTOMY  Patient Location: Endoscopy Unit  Anesthesia Type:MAC  Level of Consciousness:  sedated, patient cooperative and responds to stimulation  Airway & Oxygen Therapy:Patient Spontanous Breathing and Patient connected to face mask oxgen  Post-op Assessment:  Report given to PACU RN and Post -op Vital signs reviewed and stable  Post vital signs:  Reviewed and stable  Last Vitals:  Vitals:   12/21/17 1042  BP: (!) 142/88  Pulse: 68  Resp: 17  Temp: 36.8 C  SpO2: 502%    Complications: No apparent anesthesia complications

## 2017-12-21 NOTE — Discharge Instructions (Signed)

## 2017-12-21 NOTE — Anesthesia Postprocedure Evaluation (Signed)
Anesthesia Post Note  Patient: Steven Guzman  Procedure(s) Performed: ESOPHAGOGASTRODUODENOSCOPY (EGD) WITH PROPOFOL (N/A ) COLONOSCOPY WITH PROPOFOL (N/A ) POLYPECTOMY     Patient location during evaluation: Endoscopy Anesthesia Type: MAC Level of consciousness: awake Pain management: pain level controlled Vital Signs Assessment: post-procedure vital signs reviewed and stable Respiratory status: spontaneous breathing Cardiovascular status: stable Anesthetic complications: no    Last Vitals:  Vitals:   12/21/17 1210 12/21/17 1220  BP: 119/60 109/87  Pulse: (!) 51 (!) 54  Resp: 17 18  Temp:    SpO2: 100% 94%    Last Pain:  Vitals:   12/21/17 1220  TempSrc:   PainSc: 0-No pain                 Jamell Opfer

## 2017-12-21 NOTE — Interval H&P Note (Signed)
History and Physical Interval Note:  12/21/2017 10:55 AM  Steven Guzman  has presented today for surgery, with the diagnosis of iron def anemia; weight loss  The various methods of treatment have been discussed with the patient and family. After consideration of risks, benefits and other options for treatment, the patient has consented to  Procedure(s): ESOPHAGOGASTRODUODENOSCOPY (EGD) WITH PROPOFOL (N/A) COLONOSCOPY WITH PROPOFOL (N/A) as a surgical intervention .  The patient's history has been reviewed, patient examined, no change in status, stable for surgery.  I have reviewed the patient's chart and labs.  Questions were answered to the patient's satisfaction.     Scarlette Shorts

## 2017-12-21 NOTE — Op Note (Signed)
[facility] Patient Name: Steven Guzman Procedure Date: 12/21/2017 MRN: [id number] Attending MD: Docia Chuck. Henrene Pastor , MD Date of Birth: 1940/08/07 CSN: [id number] Age: 77 Admit Type: Outpatient Procedure:                Upper GI endoscopy with biopsy Indications:              Iron deficiency anemia, Esophageal reflux Providers:                Docia Chuck. Henrene Pastor, MD, Cleda Daub, RN, Elspeth Cho Tech., Technician, Arnoldo Hooker, CRNA Referring MD:              Medicines:                Monitored Anesthesia Care Complications:            No immediate complications. Estimated Blood Loss:     Estimated blood loss: none. Procedure:                Pre-Anesthesia Assessment:                           - Prior to the procedure, a History and Physical                            was performed, and patient medications and                            allergies were reviewed. The patient's tolerance of                            previous anesthesia was also reviewed. The risks                            and benefits of the procedure and the sedation                            options and risks were discussed with the patient.                            All questions were answered, and informed consent                            was obtained. Prior Anticoagulants: The patient has                            taken Xarelto (rivaroxaban), last dose was 3 days                            prior to procedure. ASA Grade Assessment: III - A                            patient with severe systemic disease. After  reviewing the risks and benefits, the patient was                            deemed in satisfactory condition to undergo the                            procedure.                           After obtaining informed consent, the endoscope was                            passed under direct vision. Throughout the   procedure, the patient's blood pressure, pulse, and                            oxygen saturations were monitored continuously. The                            [id number]-[id number] ([id number] Olympus adult endoscope was                            introduced through the mouth, and advanced to the                            second part of duodenum. The upper GI endoscopy was                            accomplished without difficulty. The patient                            tolerated the procedure well. Scope In: Scope Out: Findings:      One benign-appearing, intrinsic moderate stenosis was found.The       esophagus was otherwise normal.      Patchy moderately erythematous mucosa was found in the gastric antrum.       Biopsies were taken with a cold forceps for Helicobacter pylori testing       using CLOtest.      The stomach also revealed a large hiatal hernia with a few small       erosions.      The examined duodenum was normal.      The cardia and gastric fundus were normal on retroflexion. Impression:               - Benign-appearing esophageal stenosis.                           - Erythematous mucosa in the antrum. Biopsied.                           - Large hiatal hernia with erosions. Moderate Sedation:      none Recommendation:           - Patient has a contact number available for                            emergencies. The  signs and symptoms of potential                            delayed complications were discussed with the                            patient. Return to normal activities tomorrow.                            Written discharge instructions were provided to the                            patient.                           - Resume previous diet.                           - Continue present medications.                           - Await pathology results.                           - Resume Xarelto (rivaroxaban) at prior dose                            tomorrow.                            - Recommend daily iron supplement indefinitely Procedure Code(s):        --- Professional ---                           [phone], Esophagogastroduodenoscopy, flexible,                            transoral; with biopsy, single or multiple Diagnosis Code(s):        --- Professional ---                           K22.2, Esophageal obstruction                           K31.89, Other diseases of stomach and duodenum                           D50.9, Iron deficiency anemia, unspecified                           K21.9, Gastro-esophageal reflux disease without                            esophagitis CPT copyright 2017 American Medical Association. All rights reserved. The codes documented in this report are preliminary and upon coder review may  be revised to meet current compliance requirements. Docia Chuck. Henrene Pastor, MD 12/21/2017 12:01:15 PM This report has been signed electronically. Number of  Addenda: 0

## 2017-12-21 NOTE — Anesthesia Preprocedure Evaluation (Signed)
Anesthesia Evaluation  Patient identified by MRN, date of birth, ID band Patient awake    Reviewed: Allergy & Precautions, NPO status , Patient's Chart, lab work & pertinent test results  Airway Mallampati: II  TM Distance: >3 FB     Dental   Pulmonary pneumonia, COPD, former smoker,    breath sounds clear to auscultation       Cardiovascular hypertension, + angina + CAD, + Past MI and +CHF  + Cardiac Defibrillator  Rhythm:Regular Rate:Normal     Neuro/Psych  Neuromuscular disease    GI/Hepatic Neg liver ROS, hiatal hernia, GERD  ,  Endo/Other    Renal/GU Renal disease     Musculoskeletal   Abdominal   Peds  Hematology  (+) anemia ,   Anesthesia Other Findings   Reproductive/Obstetrics                             Anesthesia Physical Anesthesia Plan  ASA: III  Anesthesia Plan: MAC   Post-op Pain Management:    Induction: Intravenous  PONV Risk Score and Plan: Treatment may vary due to age or medical condition, Ondansetron, Dexamethasone and Midazolam  Airway Management Planned: Simple Face Mask and Nasal Cannula  Additional Equipment:   Intra-op Plan:   Post-operative Plan:   Informed Consent: I have reviewed the patients History and Physical, chart, labs and discussed the procedure including the risks, benefits and alternatives for the proposed anesthesia with the patient or authorized representative who has indicated his/her understanding and acceptance.   Dental advisory given  Plan Discussed with: CRNA and Anesthesiologist  Anesthesia Plan Comments:         Anesthesia Quick Evaluation

## 2017-12-22 ENCOUNTER — Encounter: Payer: Self-pay | Admitting: Family Medicine

## 2017-12-22 LAB — CLOTEST (H. PYLORI), BIOPSY: Helicobacter screen: NEGATIVE

## 2017-12-24 ENCOUNTER — Encounter (HOSPITAL_COMMUNITY): Payer: Self-pay | Admitting: Internal Medicine

## 2017-12-26 ENCOUNTER — Other Ambulatory Visit: Payer: Self-pay | Admitting: Interventional Cardiology

## 2017-12-27 ENCOUNTER — Encounter: Payer: Self-pay | Admitting: Internal Medicine

## 2017-12-27 ENCOUNTER — Ambulatory Visit (INDEPENDENT_AMBULATORY_CARE_PROVIDER_SITE_OTHER): Payer: Medicare Other | Admitting: Internal Medicine

## 2017-12-27 DIAGNOSIS — I25810 Atherosclerosis of coronary artery bypass graft(s) without angina pectoris: Secondary | ICD-10-CM | POA: Diagnosis not present

## 2017-12-27 DIAGNOSIS — I472 Ventricular tachycardia, unspecified: Secondary | ICD-10-CM

## 2017-12-27 LAB — CUP PACEART INCLINIC DEVICE CHECK
Date Time Interrogation Session: 20190930040000
HIGH POWER IMPEDANCE MEASURED VALUE: 58 Ohm
HighPow Impedance: 43 Ohm
Implantable Lead Implant Date: 20110610
Implantable Lead Implant Date: 20110610
Implantable Lead Location: 753860
Implantable Lead Model: 185
Implantable Lead Serial Number: 339643
Lead Channel Pacing Threshold Amplitude: 0.9 V
Lead Channel Pacing Threshold Pulse Width: 0.4 ms
Lead Channel Pacing Threshold Pulse Width: 0.4 ms
Lead Channel Setting Pacing Amplitude: 2 V
Lead Channel Setting Pacing Amplitude: 2 V
MDC IDC LEAD LOCATION: 753859
MDC IDC LEAD SERIAL: 28681386
MDC IDC MSMT LEADCHNL RA IMPEDANCE VALUE: 644 Ohm
MDC IDC MSMT LEADCHNL RA PACING THRESHOLD AMPLITUDE: 0.9 V
MDC IDC MSMT LEADCHNL RA SENSING INTR AMPL: 2.7 mV
MDC IDC MSMT LEADCHNL RV IMPEDANCE VALUE: 465 Ohm
MDC IDC MSMT LEADCHNL RV SENSING INTR AMPL: 9.2 mV
MDC IDC PG IMPLANT DT: 20110610
MDC IDC PG SERIAL: 164892
MDC IDC SET LEADCHNL RV PACING PULSEWIDTH: 0.4 ms
MDC IDC SET LEADCHNL RV SENSING SENSITIVITY: 0.6 mV

## 2017-12-27 NOTE — Progress Notes (Signed)
HPI [name] returns today for followup of his VT and atrial fib. He is a pleasant 77 yo man with CAD, PAF, who has been on amiodarone for several years. He has COPD but is not on oxygen. He has iron def anemia and underwent EGD which showed erosions and mild esophageal stenosis. No edema.  Allergies  Allergen Reactions  . Sulfa Antibiotics Hives  . Sulfasalazine Hives     Current Outpatient Medications  Medication Sig Dispense Refill  . albuterol (PROVENTIL) (2.5 MG/3ML) 0.083% nebulizer solution Take 3 mLs (2.5 mg total) by nebulization every 6 (six) hours as needed for wheezing or shortness of breath. 360 mL 12  . amiodarone (PACERONE) 200 MG tablet Take 1 tablet (200 mg total) by mouth daily. 90 tablet 2  . Artificial Tear Solution (SOOTHE XP OP) Place 2 drops into both eyes daily as needed (dry eyes).    [name] atorvastatin (LIPITOR) 80 MG tablet Take 1 tablet (80 mg total) by mouth daily. 90 tablet 3  . benazepril (LOTENSIN) 10 MG tablet Take 0.5 tablets (5 mg total) by mouth daily. 45 tablet 3  . budesonide-formoterol (SYMBICORT) 160-4.5 MCG/ACT inhaler Inhale 2 puffs into the lungs 2 (two) times daily. (Patient taking differently: Inhale 2 puffs into the lungs daily. ) 1 Inhaler 5  . buPROPion (WELLBUTRIN) 75 MG tablet Take 1 tablet (75 mg total) by mouth daily. 90 tablet 1  . busPIRone (BUSPAR) 15 MG tablet TAKE 1 TABLET BY MOUTH THREE TIMES A DAY (Patient taking differently: Take 15 mg by mouth 2 (two) times daily. ) 90 tablet 0  . carvedilol (COREG) 3.125 MG tablet TAKE 1 TABLET(3.125 MG) BY MOUTH TWICE DAILY (Patient taking differently: Take 3.125 mg by mouth 2 (two) times daily with a meal. ) 180 tablet 0  . cetirizine (ZYRTEC) 10 MG tablet Take 10 mg by mouth daily as needed for allergies.    . Cholecalciferol (VITAMIN D3) 2000 units CHEW Chew 4,000 Units by mouth daily.     . Choline Fenofibrate (FENOFIBRIC ACID) 135 MG CPDR TAKE 1 CAPSULE BY MOUTH DAILY 30 capsule 6  .  cyanocobalamin (,VITAMIN B-12,) 1000 MCG/ML injection Inject 1 mL (1,000 mcg total) into the muscle every 14 (fourteen) days. 6 mL 3  . escitalopram (LEXAPRO) 20 MG tablet TAKE 1 TABLET(20 MG) BY MOUTH DAILY (Patient taking differently: Take 20 mg by mouth daily. ) 90 tablet 1  . furosemide (LASIX) 40 MG tablet TAKE 1/2 TABLET(20 MG) BY MOUTH DAILY (Patient taking differently: Take 20 mg by mouth daily. ) 45 tablet 2  . gabapentin (NEURONTIN) 300 MG capsule Take 1 capsule (300 mg total) by mouth 2 (two) times daily. (Patient taking differently: Take 300 mg by mouth 3 (three) times daily. ) 180 capsule 2  . Ipratropium-Albuterol (COMBIVENT RESPIMAT) 20-100 MCG/ACT AERS respimat Inhale 1 puff into the lungs every 6 (six) hours. Shortness of breath or wheezing (Patient taking differently: Inhale 1 puff into the lungs every 6 (six) hours as needed for wheezing. ) 1 Inhaler 3  . Magnesium Oxide 400 MG CAPS Take 1 capsule (400 mg total) by mouth daily. 90 capsule 3  . mexiletine (MEXITIL) 200 MG capsule Take 1 capsule (200 mg total) 3 (three) times daily by mouth. (Patient taking differently: Take 200 mg by mouth 2 (two) times daily. ) 270 capsule 3  . Multiple Vitamin (MULTIVITAMIN WITH MINERALS) TABS tablet Take 1 tablet by mouth daily.    . nitroGLYCERIN (NITROSTAT)  0.4 MG SL tablet Place 1 tablet (0.4 mg total) under the tongue every 5 (five) minutes x 3 doses as needed for chest pain. 25 tablet 6  . omeprazole (PRILOSEC) 20 MG capsule Take 20 mg by mouth 2 (two) times daily.    . potassium chloride SA (K-DUR,KLOR-CON) 20 MEQ tablet Take 1 tablet (20 mEq total) by mouth daily. (Patient taking differently: Take 20 mEq by mouth at bedtime. ) 90 tablet 3  . Respiratory Therapy Supplies (FLUTTER) DEVI Use as directed 1 each 0  . Syringe/Needle, Disp, (SYRINGE 3CC/25GX5/8") 25G X 5/8" 3 ML MISC 1 each by Does not apply route every 14 (fourteen) days. 50 each 0  . tamsulosin (FLOMAX) 0.4 MG CAPS capsule TAKE 1  CAPSULE BY MOUTH EVERY DAY AFTER SUPPER (Patient taking differently: Take 0.4 mg by mouth daily after supper. ) 30 capsule 3  . Tiotropium Bromide Monohydrate (SPIRIVA RESPIMAT) 2.5 MCG/ACT AERS Inhale 2 puffs into the lungs daily. 1 Inhaler 3  . XARELTO 20 MG TABS tablet TAKE 1 TABLET BY MOUTH EVERY DAY WITH SUPPER (Patient taking differently: Take 20 mg by mouth daily with supper. ) 30 tablet 9   No current facility-administered medications for this visit.      Past Medical History:  Diagnosis Date  . AICD (automatic cardioverter/defibrillator) present   . Aneurysm ([place])    a. Aneurysmal infrarenal aorta up to 33 mm on CT 10/2014, recommended f/u due 10/2017  . Anginal pain ([place])   . Anxiety   . Basal cell carcinoma of nose    S/P MOHS  . Biliary acute pancreatitis   . CAD (coronary artery disease)    a. s/p MI in 1994 with PCI to LAD at that time b. cath 10/2012 demonstrated EF 30%, inferior akinesis with mild hypokinesis of all walls, patent LAD and RCA stents; ostial PDA with 80-90% obstruction with medical therapy recommended   . Chronic systolic CHF (congestive heart failure) (HCC)    EF 30 to 35 % as of 09/2014.   [name] CKD (chronic kidney disease), stage III ([place])   . Complication of anesthesia 10/2014   "had to have defibrillator w/ERCP"  . COPD (chronic obstructive pulmonary disease) ([place])    a. followed by pulmonary, COPD GOLD stage II  . Depression   . Diverticulosis of colon 07/2014   noted on CT  . GERD (gastroesophageal reflux disease)   . Hiatal hernia   . Hyperglycemia 10/2012.  [name] Hyperlipidemia   . Hypertension   . Myocardial infarction [facility]) 1994; 2011  . Pneumonia 1946; 2015  . Prostate enlargement 07/2014   observed on CT  . Tobacco abuse   . Ventricular tachycardia ([place])    a. 08/2009 s/p BSX E110 Teligen 100 AICD, ser#: [id number];  b. 08/2008 VT req ATP - detection reprogrammed from 160 to 150. c. EPS and VT ablation by Dr. Lovena Le 12/21/2014    ROS:   All systems  reviewed and negative except as noted in the HPI.   Past Surgical History:  Procedure Laterality Date  . BIOPSY  12/21/2017   Procedure: BIOPSY;  Surgeon: [name], MD;  Location: [facility];  Service: Endoscopy;;  . CATARACT EXTRACTION W/ INTRAOCULAR LENS  IMPLANT, BILATERAL Bilateral ~ 2011  . COLONOSCOPY    . COLONOSCOPY WITH PROPOFOL N/A 12/21/2017   Procedure: COLONOSCOPY WITH PROPOFOL;  Surgeon: [name], MD;  Location: [facility];  Service: Endoscopy;  Laterality: N/A;  . ELECTROPHYSIOLOGIC STUDY N/A 12/21/2014  Procedure: V Tach Ablation;  Surgeon: [name], MD;  Location: [facility];  Service: Cardiovascular;  Laterality: N/A;  . ERCP N/A 11/16/2014   Procedure: ENDOSCOPIC RETROGRADE CHOLANGIOPANCREATOGRAPHY (ERCP);  Surgeon: [name], MD;  Location: [place];  Service: Endoscopy;  Laterality: N/A;  . ESOPHAGOGASTRODUODENOSCOPY (EGD) WITH PROPOFOL N/A 12/21/2017   Procedure: ESOPHAGOGASTRODUODENOSCOPY (EGD) WITH PROPOFOL;  Surgeon: [name], MD;  Location: [facility];  Service: Endoscopy;  Laterality: N/A;  . EYE SURGERY    . FOOT SURGERY Left 2005   "fixed bone that stuck out in my ankle area"  . HEMORRHOID BANDING    . IMPLANTABLE CARDIOVERTER DEFIBRILLATOR IMPLANT  09/06/09   BSX dual chamber ICD implanted in [place] for cardiac arrest 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  . INGUINAL HERNIA REPAIR Right ~ 1995  . LEFT HEART CATHETERIZATION WITH CORONARY ANGIOGRAM N/A 11/25/2012   demonstrated EF 30%, inferior akinesis with mild hypokinesis of all walls, patent LAD and RCA stents; ostial PDA with 80-90% obstruction with medical therapy recommended  . MOHS SURGERY  2008   nose, skin graft  . POLYPECTOMY  12/21/2017   Procedure: POLYPECTOMY;  Surgeon: [name], MD;  Location: [facility];  Service: Endoscopy;;  . RETINAL DETACHMENT SURGERY Right 2013  . TENOLYSIS Right 12/21/2013   Procedure: TENOLYSIS FLEXOR CARPI RADIALIS ,DEBRIDEMENT RIGHT  JOINT WRIST,DEBRIDEMENT SCAPHOTRAPEZIAL TRAPEZOID, REPAIR OF EXTENSOR HOOD;  Surgeon: Daryll Brod, MD;  Location: [facility];  Service: Orthopedics;  Laterality: Right;  . V-TACH ABLATION  12/21/2014  . VIDEO BRONCHOSCOPY Bilateral 01/09/2016   Procedure: VIDEO BRONCHOSCOPY WITHOUT FLUORO;  Surgeon: Juanito Doom, MD;  Location: [facility];  Service: Cardiopulmonary;  Laterality: Bilateral;     Family History  Problem Relation Age of Onset  . Heart attack Brother   . CAD Father   . Hypertension Father   . CAD Mother   . Hypertension Mother   . Hypertension Brother   . Stroke Neg Hx      Social History   Socioeconomic History  . Marital status: Married    Spouse name: Not on file  . Number of children: Not on file  . Years of education: Not on file  . Highest education level: Not on file  Occupational History  . Occupation: Retired  Scientific laboratory technician  . Financial resource strain: Not on file  . Food insecurity:    Worry: Not on file    Inability: Not on file  . Transportation needs:    Medical: Not on file    Non-medical: Not on file  Tobacco Use  . Smoking status: Former Smoker    Packs/day: 1.00    Years: 55.00    Pack years: 55.00    Types: Cigarettes    Last attempt to quit: 10/12/2016    Years since quitting: 1.2  . Smokeless tobacco: Never Used  . Tobacco comment: had quit, recently bought a pack  Substance and Sexual Activity  . Alcohol use: Yes    Alcohol/week: 0.0 standard drinks    Comment: 12/21/2014 "might have a couple glasses of wine/year"  . Drug use: No  . Sexual activity: Not Currently  Lifestyle  . Physical activity:    Days per week: Not on file    Minutes per session: Not on file  . Stress: Not on file  Relationships  . Social connections:    Talks on phone: Not on file    Gets together: Not on file  Attends religious service: Not on file    Active member of club or organization: Not on file    Attends meetings of clubs  or organizations: Not on file    Relationship status: Not on file  . Intimate partner violence:    Fear of current or ex partner: Not on file    Emotionally abused: Not on file    Physically abused: Not on file    Forced sexual activity: Not on file  Other Topics Concern  . Not on file  Social History Narrative  . Not on file     BP (!) 146/82   Pulse 68   Ht 5\' 10"  (1.778 m)   Wt 204 lb 3.2 oz (92.6 kg)   BMI 29.30 kg/m   Physical Exam:  Well appearing NAD HEENT: Unremarkable Neck:  No JVD, no thyromegally Lymphatics:  No adenopathy Back:  No CVA tenderness Lungs:  Clear HEART:  Regular rate rhythm, no murmurs, no rubs, no clicks Abd:  soft, positive bowel sounds, no organomegally, no rebound, no guarding Ext:  2 plus pulses, no edema, no cyanosis, no clubbing Skin:  No rashes no nodules Neuro:  CN II through XII intact, motor grossly intact  EKG - nsr  DEVICE  Normal device function.  See PaceArt for details.   Assess/Plan: 1. VT - he has been stable and will continue his amiodarone and mexiltine 2. PAF - he is maintaining NSR. He will continue his current meds. 3. ICD - his [place] [employer] ICD is working normally. Will recheck in several months. 4. Chronic systolic heart failure - his symptoms remain class 2B. He will continue his current meds. He is encouraged to maintain a low sodium diet.  Mikle Bosworth.D.

## 2017-12-27 NOTE — Patient Instructions (Signed)
Your physician recommends that you continue on your current medications as directed. Please refer to the Current Medication list given to you today.  Your physician wants you to follow-up in: 1 year with Dr. Taylor.  You will receive a reminder letter in the mail two months in advance. If you don't receive a letter, please call our office to schedule the follow-up appointment.  

## 2017-12-30 ENCOUNTER — Ambulatory Visit (INDEPENDENT_AMBULATORY_CARE_PROVIDER_SITE_OTHER): Payer: Medicare Other | Admitting: *Deleted

## 2017-12-30 DIAGNOSIS — I472 Ventricular tachycardia, unspecified: Secondary | ICD-10-CM

## 2017-12-30 DIAGNOSIS — I255 Ischemic cardiomyopathy: Secondary | ICD-10-CM

## 2017-12-30 NOTE — Progress Notes (Signed)
Remote ICD transmission.   

## 2018-01-05 ENCOUNTER — Ambulatory Visit: Payer: Medicare Other

## 2018-01-10 ENCOUNTER — Other Ambulatory Visit: Payer: Self-pay | Admitting: Interventional Cardiology

## 2018-01-17 ENCOUNTER — Other Ambulatory Visit: Payer: Self-pay | Admitting: Internal Medicine

## 2018-01-21 LAB — CUP PACEART REMOTE DEVICE CHECK
Implantable Lead Implant Date: 20110610
Implantable Lead Location: 753859
Implantable Lead Location: 753860
Implantable Lead Model: 4135
Implantable Lead Serial Number: 339643
Implantable Pulse Generator Implant Date: 20110610
MDC IDC LEAD IMPLANT DT: 20110610
MDC IDC LEAD SERIAL: 28681386
MDC IDC SESS DTM: 20191025135116
Pulse Gen Serial Number: 164892

## 2018-01-26 ENCOUNTER — Telehealth: Payer: Self-pay | Admitting: Pulmonary Disease

## 2018-01-26 ENCOUNTER — Other Ambulatory Visit: Payer: Self-pay | Admitting: Pulmonary Disease

## 2018-01-26 MED ORDER — TIOTROPIUM BROMIDE MONOHYDRATE 2.5 MCG/ACT IN AERS: 2.0000 | INHALATION_SPRAY | Freq: Every day | RESPIRATORY_TRACT | 0 refills | Status: DC

## 2018-01-26 NOTE — Telephone Encounter (Signed)
Spoke with pt. He is needing a refill on Spiriva Respimat. Advised him that he was overdue for an appointment with Dr. Lake Bells. He has been scheduled to see Dr. Lake Bells on 02/21/18 at 4pm. Rx has been sent in. Nothing further was needed.

## 2018-01-31 ENCOUNTER — Encounter: Payer: Self-pay | Admitting: Internal Medicine

## 2018-01-31 ENCOUNTER — Other Ambulatory Visit: Payer: Self-pay | Admitting: Internal Medicine

## 2018-02-03 ENCOUNTER — Encounter: Payer: Self-pay | Admitting: Internal Medicine

## 2018-02-03 ENCOUNTER — Ambulatory Visit (INDEPENDENT_AMBULATORY_CARE_PROVIDER_SITE_OTHER): Payer: Medicare Other | Admitting: Internal Medicine

## 2018-02-03 DIAGNOSIS — I255 Ischemic cardiomyopathy: Secondary | ICD-10-CM | POA: Diagnosis not present

## 2018-02-03 DIAGNOSIS — R6889 Other general symptoms and signs: Secondary | ICD-10-CM

## 2018-02-03 MED ORDER — NYSTATIN 100000 UNIT/ML MT SUSP: 5.0000 mL | Freq: Two times a day (BID) | OROMUCOSAL | 0 refills | Status: DC

## 2018-02-03 NOTE — Assessment & Plan Note (Signed)
Appears to be thrush on exam, does use symbicort with recent increase in usage. Rx for nystatin swish and swallow. If not resolving needs to see ENT to rule out cancer given persistent and chronic smoking.

## 2018-02-03 NOTE — Patient Instructions (Signed)
We have sent in the mouth wash to use twice a day for 1 week.

## 2018-02-03 NOTE — Progress Notes (Signed)
   Subjective:    Patient ID: Steven Guzman, male    DOB: 1940/12/11, 77 y.o.   MRN: [id number]  HPI The patient is a 77 YO man coming in for white patch on the back of throat. He was seen at [facility] for several white patches and given treatment for this. He did take that and got some relief. He still has one patch and was worried about this. He denies recent antibiotics. He does take symbicort and recently noticed that he has been supposed to take it twice a day and always had taken once a day and increased to twice a day in the last month or so. He is a current smoker and has long smoking history. Some chronic voice changes but nothing different lately. Denies pain in throat or pain with swallowing. Denies change in weight or food getting stuck.   Review of Systems  Constitutional: Negative.   HENT: Positive for postnasal drip. Negative for sore throat, tinnitus and voice change.        Throat white patch  Eyes: Negative.   Respiratory: Negative for cough, chest tightness and shortness of breath.   Cardiovascular: Negative for chest pain, palpitations and leg swelling.  Gastrointestinal: Negative for abdominal distention, abdominal pain, constipation, diarrhea, nausea and vomiting.  Musculoskeletal: Negative.   Skin: Negative.   Neurological: Negative.       Objective:   Physical Exam  Constitutional: He is oriented to person, place, and time. He appears well-developed and well-nourished.  HENT:  Head: Normocephalic and atraumatic.  Throat with white patch posterior right tonsil, left normal  Eyes: EOM are normal.  Neck: Normal range of motion.  Cardiovascular: Normal rate and regular rhythm.  Pulmonary/Chest: Effort normal. No respiratory distress. He has no wheezes. He has no rales.  Lung exam stable.   Abdominal: Soft. Bowel sounds are normal. He exhibits no distension. There is no tenderness. There is no rebound.  Musculoskeletal: He exhibits no edema.  Neurological: He is alert  and oriented to person, place, and time. Coordination normal.  Skin: Skin is warm and dry.   Vitals:   02/03/18 1344  BP: 104/68  Pulse: (!) 55  Temp: 98.2 F (36.8 C)  TempSrc: Oral  SpO2: 97%  Weight: 199 lb (90.3 kg)  Height: 5\' 10"  (1.778 m)      Assessment & Plan:

## 2018-02-15 ENCOUNTER — Other Ambulatory Visit: Payer: Self-pay

## 2018-02-21 ENCOUNTER — Ambulatory Visit: Payer: Medicare Other | Admitting: Pulmonary Disease

## 2018-02-28 ENCOUNTER — Encounter: Payer: Self-pay | Admitting: Pulmonary Disease

## 2018-02-28 ENCOUNTER — Ambulatory Visit (INDEPENDENT_AMBULATORY_CARE_PROVIDER_SITE_OTHER): Payer: Medicare Other | Admitting: Pulmonary Disease

## 2018-02-28 VITALS — BP 124/76 | HR 57 | Ht 72.44 in | Wt 198.4 lb

## 2018-02-28 DIAGNOSIS — I255 Ischemic cardiomyopathy: Secondary | ICD-10-CM

## 2018-02-28 DIAGNOSIS — F17210 Nicotine dependence, cigarettes, uncomplicated: Secondary | ICD-10-CM | POA: Diagnosis not present

## 2018-02-28 DIAGNOSIS — J432 Centrilobular emphysema: Secondary | ICD-10-CM

## 2018-02-28 MED ORDER — FLUTTER DEVI: 1.0000 | Freq: Once | 0 refills | Status: AC

## 2018-02-28 NOTE — Patient Instructions (Signed)
Severe COPD: Continue taking Spiriva and Symbicort Practice good hand hygeine Stay active  Ongoing tobacco use: Try your best to quit smoking Let me know if you would like a prescription for a nicotrol inhaler  Physical deconditioning: Exercise as much as possible, try to work in resistance training twice a week  Follow up with me in 6 months or sooner if needed

## 2018-02-28 NOTE — Progress Notes (Signed)
Subjective:    Patient ID: Steven Guzman, male    DOB: Jul 06, 1940, 76 y.o.   MRN: [id number]  Synopsis: GOLD Grade C COPD, quit smoking in 2018; He started smoking again after 6 months and is still smoking as of 02/2018.     HPI Chief Complaint  Patient presents with  . Follow-up    shortness of breath   Steven Guzman is here to see me for his COPD and is here for a follow up.  He says that he has lost about 20 pounds since the last. Steven Guzman says that he had a endoscopy looking for a cause of his weight loss and it was normal.  He was found to have H.pylori.  He has been taking omeprazole since 1996.   He thinks that his weight loss.   He has not had bronchitis or pneumonia.   He switched from Trelegy to Symbicort and Spiriva.   Past Medical History:  Diagnosis Date  . AICD (automatic cardioverter/defibrillator) present   . Aneurysm ([place])    a. Aneurysmal infrarenal aorta up to 33 mm on CT 10/2014, recommended f/u due 10/2017  . Anginal pain ([place])   . Anxiety   . Basal cell carcinoma of nose    S/P MOHS  . Biliary acute pancreatitis   . CAD (coronary artery disease)    a. s/p MI in 1994 with PCI to LAD at that time b. cath 10/2012 demonstrated EF 30%, inferior akinesis with mild hypokinesis of all walls, patent LAD and RCA stents; ostial PDA with 80-90% obstruction with medical therapy recommended   . Chronic systolic CHF (congestive heart failure) (HCC)    EF 30 to 35 % as of 09/2014.   [name] CKD (chronic kidney disease), stage III ([place])   . Complication of anesthesia 10/2014   "had to have defibrillator w/ERCP"  . COPD (chronic obstructive pulmonary disease) ([place])    a. followed by pulmonary, COPD GOLD stage II  . Depression   . Diverticulosis of colon 07/2014   noted on CT  . GERD (gastroesophageal reflux disease)   . Hiatal hernia   . Hyperglycemia 10/2012.  [name] Hyperlipidemia   . Hypertension   . Myocardial infarction [facility]) 1994; 2011  . Pneumonia 1946; 2015  . Prostate enlargement  07/2014   observed on CT  . Tobacco abuse   . Ventricular tachycardia ([place])    a. 08/2009 s/p BSX E110 Teligen 100 AICD, ser#: [id number];  b. 08/2008 VT req ATP - detection reprogrammed from 160 to 150. c. EPS and VT ablation by Dr. Lovena Le 12/21/2014     Review of Systems  Constitutional: Negative for chills, fatigue and fever.  HENT: Negative for postnasal drip, rhinorrhea and sinus pressure.   Respiratory: Negative for cough, shortness of breath and wheezing.   Cardiovascular: Negative for chest pain, palpitations and leg swelling.       Objective:   Physical Exam Vitals:   02/28/18 1425  BP: 124/76  Pulse: (!) 57  SpO2: 96%  Weight: 198 lb 6.4 oz (90 kg)  Height: 6' 0.44" (1.84 m)   RA  Gen: chronically ill appearing HENT: OP clear, TM's clear, neck supple PULM: Poor air movement, some wheezing noted B, normal percussion CV: RRR, no mgr, trace edema GI: BS+, soft, nontender Derm: no cyanosis or rash Psyche: normal mood and affect     Records reviewed from his hospitalization a week ago for ventricular tachycardia.  PFT: 10/2013 PFT> ratio 61%, FEV1 2.53L (77% pred,  15% change), TLC 8.65 L (117% pred), RV 4.25L (161% pred), DLCO 25.39 (73% pred)  Procedure: Bronchoscopy 01/09/2016 normal airways  Imaging: July 2017 CT chest images personally reviewed showing mild to moderate centrilobular emphysema, no obvious pulmonary parenchymal mass. February 2018 chest x-ray images independently reviewed showing emphysema, pacemaker in place, mild cardiomegaly, no other pulmonary parenchymal abnormalities. 11/2017 CT chest/abdomen pelvis: 3.4 cm AAA, no malignant appearing lesions in his chest, images personally reviewed. Mild centilobular emphysema noted, images independently reviewed.     Assessment & Plan:   Centrilobular emphysema ([place])  Cigarette smoker  Discussion: Despite his weight loss this has been a stable interval for Phil.  He has started smoking again so we spent  a long time today talking about ways to try to quit smoking again.    He is interested trying a nicotrol inhaler if his insurance will pay for it.  Severe COPD: Continue taking Spiriva and Symbicort Practice good hand hygeine Stay active I am glad your flu shot was up to date (given at [place])  Ongoing tobacco use: Try your best to quit smoking Let me know if you would like a prescription for a nicotrol inhaler  Physical deconditioning: Exercise as much as possible, try to work in resistance training twice a week  Follow up with me in 6 months or sooner if needed  > 50% of this 25 minute visit spent face to face Updated Medication List Outpatient Encounter Medications as of 02/28/2018  Medication Sig  . albuterol (PROVENTIL) (2.5 MG/3ML) 0.083% nebulizer solution Take 3 mLs (2.5 mg total) by nebulization every 6 (six) hours as needed for wheezing or shortness of breath.  [name] amiodarone (PACERONE) 200 MG tablet Take 1 tablet (200 mg total) by mouth daily.  . Artificial Tear Solution (SOOTHE XP OP) Place 2 drops into both eyes daily as needed (dry eyes).  [name] atorvastatin (LIPITOR) 80 MG tablet Take 1 tablet (80 mg total) by mouth daily.  . benazepril (LOTENSIN) 10 MG tablet TAKE 1 TABLET BY MOUTH ONCE DAILY  . budesonide-formoterol (SYMBICORT) 160-4.5 MCG/ACT inhaler Inhale 2 puffs into the lungs 2 (two) times daily. (Patient taking differently: Inhale 2 puffs into the lungs daily. )  . buPROPion (WELLBUTRIN) 75 MG tablet Take 1 tablet (75 mg total) by mouth daily.  . busPIRone (BUSPAR) 15 MG tablet TAKE 1 TABLET BY MOUTH THREE TIMES A DAY  . carvedilol (COREG) 3.125 MG tablet TAKE 1 TABLET(3.125 MG) BY MOUTH TWICE DAILY (Patient taking differently: Take 3.125 mg by mouth 2 (two) times daily with a meal. )  . cetirizine (ZYRTEC) 10 MG tablet Take 10 mg by mouth daily as needed for allergies.  . Cholecalciferol (VITAMIN D3) 2000 units CHEW Chew 4,000 Units by mouth daily.   . Choline  Fenofibrate (FENOFIBRIC ACID) 135 MG CPDR TAKE 1 CAPSULE BY MOUTH DAILY  . cyanocobalamin (,VITAMIN B-12,) 1000 MCG/ML injection Inject 1 mL (1,000 mcg total) into the muscle every 14 (fourteen) days.  [name] escitalopram (LEXAPRO) 20 MG tablet TAKE 1 TABLET(20 MG) BY MOUTH DAILY  . furosemide (LASIX) 40 MG tablet TAKE 1/2 TABLET(20 MG) BY MOUTH DAILY (Patient taking differently: Take 20 mg by mouth daily. )  . gabapentin (NEURONTIN) 300 MG capsule Take 1 capsule (300 mg total) by mouth 2 (two) times daily. (Patient taking differently: Take 300 mg by mouth 3 (three) times daily. )  . Ipratropium-Albuterol (COMBIVENT RESPIMAT) 20-100 MCG/ACT AERS respimat Inhale 1 puff into the lungs every 6 (six) hours. Shortness  of breath or wheezing (Patient taking differently: Inhale 1 puff into the lungs every 6 (six) hours as needed for wheezing. )  . IRON PO Take by mouth.  . Magnesium Oxide 400 MG CAPS Take 1 capsule (400 mg total) by mouth daily.  [name] mexiletine (MEXITIL) 200 MG capsule TAKE 1 CAPSULE BY MOUTH THREE TIMES DAILY  . Multiple Vitamin (MULTIVITAMIN WITH MINERALS) TABS tablet Take 1 tablet by mouth daily.  . nitroGLYCERIN (NITROSTAT) 0.4 MG SL tablet Place 1 tablet (0.4 mg total) under the tongue every 5 (five) minutes x 3 doses as needed for chest pain.  [name] nystatin (MYCOSTATIN) 100000 UNIT/ML suspension Take 5 mLs (500,000 Units total) by mouth 2 (two) times daily.  [name] omeprazole (PRILOSEC) 20 MG capsule Take 20 mg by mouth 2 (two) times daily.  . potassium chloride SA (K-DUR,KLOR-CON) 20 MEQ tablet Take 1 tablet (20 mEq total) by mouth daily. (Patient taking differently: Take 20 mEq by mouth at bedtime. )  . Respiratory Therapy Supplies (FLUTTER) DEVI Use as directed  . Syringe/Needle, Disp, (SYRINGE 3CC/25GX5/8") 25G X 5/8" 3 ML MISC 1 each by Does not apply route every 14 (fourteen) days.  . tamsulosin (FLOMAX) 0.4 MG CAPS capsule TAKE 1 CAPSULE BY MOUTH EVERY DAY AFTER SUPPER (Patient taking  differently: Take 0.4 mg by mouth daily after supper. )  . Tiotropium Bromide Monohydrate (SPIRIVA RESPIMAT) 2.5 MCG/ACT AERS Inhale 2 puffs into the lungs daily.  Alveda Reasons 20 MG TABS tablet TAKE 1 TABLET BY MOUTH EVERY DAY WITH SUPPER (Patient taking differently: Take 20 mg by mouth daily with supper. )   No facility-administered encounter medications on file as of 02/28/2018.

## 2018-03-01 ENCOUNTER — Telehealth: Payer: Self-pay | Admitting: Pulmonary Disease

## 2018-03-01 NOTE — Telephone Encounter (Signed)
I know he needs a flutter valve (we didn't have any yesterday), and he may also need a nebulizer machine.  Can you call him and ask to clarify?

## 2018-03-01 NOTE — Telephone Encounter (Signed)
Please advise on what item is truly needed and order accordingly. Thanks.

## 2018-03-01 NOTE — Telephone Encounter (Signed)
ATC pt, no answer. Left message for pt to call back.  

## 2018-03-03 NOTE — Telephone Encounter (Signed)
Attempted to call pt but unable to reach him. Left message for pt to return call. 

## 2018-03-04 NOTE — Telephone Encounter (Signed)
Called and spoke with pt to clarify if it was a flutter valve, neb machine or both that he was needing from [facility]. Pt stated it was just the flutter valve that he was needing.  Order that was placed 02/28/18 and was sent to [facility] stating flutter valve, which is correct. Attempted to call [name] with [facility] in regards to this but unable to reach him. Left a message for [name] to return call.

## 2018-03-07 NOTE — Telephone Encounter (Signed)
Does not seem like anything is needed for this message. If Steven Guzman calls back we will just let him know.  Closing message

## 2018-03-08 NOTE — Progress Notes (Signed)
[name] [address] [place], [place] [address] Phone: [phone] Subjective:     CC: Neck and back pain follow-up  [id number]  Steven Guzman is a 78 y.o. male coming in with complaint of neck and back pain.  Continues to have it.  Chronic at this time.  Seeing multiple providers for multiple things.  Patient is wondering why he continues to be in pain.  Does state that he takes his wife's hydrocodone from time to time and does seem to be helpful.  Patient states that he feels that he has been less and less active overall.  Noncompliant with our other suggestions including iron and vitamin D supplementation.  Has had severe iron deficiency previously.  Last time we saw the patient we discussed the possibility of imaging with patient declined at that time.    Past Medical History:  Diagnosis Date  . AICD (automatic cardioverter/defibrillator) present   . Aneurysm ([place])    a. Aneurysmal infrarenal aorta up to 33 mm on CT 10/2014, recommended f/u due 10/2017  . Anginal pain ([place])   . Anxiety   . Basal cell carcinoma of nose    S/P MOHS  . Biliary acute pancreatitis   . CAD (coronary artery disease)    a. s/p MI in 1994 with PCI to LAD at that time b. cath 10/2012 demonstrated EF 30%, inferior akinesis with mild hypokinesis of all walls, patent LAD and RCA stents; ostial PDA with 80-90% obstruction with medical therapy recommended   . Chronic systolic CHF (congestive heart failure) (HCC)    EF 30 to 35 % as of 09/2014.   [name] CKD (chronic kidney disease), stage III ([place])   . Complication of anesthesia 10/2014   "had to have defibrillator w/ERCP"  . COPD (chronic obstructive pulmonary disease) ([place])    a. followed by pulmonary, COPD GOLD stage II  . Depression   . Diverticulosis of colon 07/2014   noted on CT  . GERD (gastroesophageal reflux disease)   . Hiatal hernia   . Hyperglycemia 10/2012.  [name] Hyperlipidemia   . Hypertension   . Myocardial  infarction [facility]) 1994; 2011  . Pneumonia 1946; 2015  . Prostate enlargement 07/2014   observed on CT  . Tobacco abuse   . Ventricular tachycardia ([place])    a. 08/2009 s/p BSX E110 Teligen 100 AICD, ser#: [id number];  b. 08/2008 VT req ATP - detection reprogrammed from 160 to 150. c. EPS and VT ablation by Dr. Lovena Le 12/21/2014   Past Surgical History:  Procedure Laterality Date  . BIOPSY  12/21/2017   Procedure: BIOPSY;  Surgeon: [name], MD;  Location: [facility];  Service: Endoscopy;;  . CATARACT EXTRACTION W/ INTRAOCULAR LENS  IMPLANT, BILATERAL Bilateral ~ 2011  . COLONOSCOPY    . COLONOSCOPY WITH PROPOFOL N/A 12/21/2017   Procedure: COLONOSCOPY WITH PROPOFOL;  Surgeon: [name], MD;  Location: [facility];  Service: Endoscopy;  Laterality: N/A;  . ELECTROPHYSIOLOGIC STUDY N/A 12/21/2014   Procedure: V Tach Ablation;  Surgeon: [name], MD;  Location: [facility];  Service: Cardiovascular;  Laterality: N/A;  . ERCP N/A 11/16/2014   Procedure: ENDOSCOPIC RETROGRADE CHOLANGIOPANCREATOGRAPHY (ERCP);  Surgeon: [name], MD;  Location: [facility];  Service: Endoscopy;  Laterality: N/A;  . ESOPHAGOGASTRODUODENOSCOPY (EGD) WITH PROPOFOL N/A 12/21/2017   Procedure: ESOPHAGOGASTRODUODENOSCOPY (EGD) WITH PROPOFOL;  Surgeon: [name], MD;  Location: [facility];  Service: Endoscopy;  Laterality: N/A;  .  EYE SURGERY    . FOOT SURGERY Left 2005   "fixed bone that stuck out in my ankle area"  . HEMORRHOID BANDING    . IMPLANTABLE CARDIOVERTER DEFIBRILLATOR IMPLANT  09/06/09   BSX dual chamber ICD implanted in [place] for cardiac arrest 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  . INGUINAL HERNIA REPAIR Right ~ 1995  . LEFT HEART CATHETERIZATION WITH CORONARY ANGIOGRAM N/A 11/25/2012   demonstrated EF 30%, inferior akinesis with mild hypokinesis of all walls, patent LAD and RCA stents; ostial PDA with 80-90% obstruction with medical therapy recommended  . MOHS SURGERY  2008   nose, skin  graft  . POLYPECTOMY  12/21/2017   Procedure: POLYPECTOMY;  Surgeon: [name], MD;  Location: [facility];  Service: Endoscopy;;  . RETINAL DETACHMENT SURGERY Right 2013  . TENOLYSIS Right 12/21/2013   Procedure: TENOLYSIS FLEXOR CARPI RADIALIS ,DEBRIDEMENT RIGHT JOINT WRIST,DEBRIDEMENT SCAPHOTRAPEZIAL TRAPEZOID, REPAIR OF EXTENSOR HOOD;  Surgeon: Daryll Brod, MD;  Location: [facility];  Service: Orthopedics;  Laterality: Right;  . V-TACH ABLATION  12/21/2014  . VIDEO BRONCHOSCOPY Bilateral 01/09/2016   Procedure: VIDEO BRONCHOSCOPY WITHOUT FLUORO;  Surgeon: Juanito Doom, MD;  Location: [facility];  Service: Cardiopulmonary;  Laterality: Bilateral;   Social History   Socioeconomic History  . Marital status: Married    Spouse name: Not on file  . Number of children: Not on file  . Years of education: Not on file  . Highest education level: Not on file  Occupational History  . Occupation: Retired  Scientific laboratory technician  . Financial resource strain: Not on file  . Food insecurity:    Worry: Not on file    Inability: Not on file  . Transportation needs:    Medical: Not on file    Non-medical: Not on file  Tobacco Use  . Smoking status: Former Smoker    Packs/day: 1.00    Years: 55.00    Pack years: 55.00    Types: Cigarettes    Last attempt to quit: 10/12/2016    Years since quitting: 1.4  . Smokeless tobacco: Never Used  . Tobacco comment: had quit, recently bought a pack  Substance and Sexual Activity  . Alcohol use: Yes    Alcohol/week: 0.0 standard drinks    Comment: 12/21/2014 "might have a couple glasses of wine/year"  . Drug use: No  . Sexual activity: Not Currently  Lifestyle  . Physical activity:    Days per week: Not on file    Minutes per session: Not on file  . Stress: Not on file  Relationships  . Social connections:    Talks on phone: Not on file    Gets together: Not on file    Attends religious service: Not on file    Active member of  club or organization: Not on file    Attends meetings of clubs or organizations: Not on file    Relationship status: Not on file  Other Topics Concern  . Not on file  Social History Narrative  . Not on file   Allergies  Allergen Reactions  . Sulfa Antibiotics Hives  . Sulfasalazine Hives   Family History  Problem Relation Age of Onset  . Heart attack Brother   . CAD Father   . Hypertension Father   . CAD Mother   . Hypertension Mother   . Hypertension Brother   . Stroke Neg Hx      Current Outpatient Medications (Cardiovascular):  .  amiodarone (PACERONE)  200 MG tablet, Take 1 tablet (200 mg total) by mouth daily. [name]  atorvastatin (LIPITOR) 80 MG tablet, Take 1 tablet (80 mg total) by mouth daily. .  benazepril (LOTENSIN) 10 MG tablet, TAKE 1 TABLET BY MOUTH ONCE DAILY .  carvedilol (COREG) 3.125 MG tablet, TAKE 1 TABLET(3.125 MG) BY MOUTH TWICE DAILY .  Choline Fenofibrate (FENOFIBRIC ACID) 135 MG CPDR, TAKE 1 CAPSULE BY MOUTH DAILY .  furosemide (LASIX) 40 MG tablet, TAKE 1/2 TABLET(20 MG) BY MOUTH DAILY (Patient taking differently: Take 20 mg by mouth daily. ) .  mexiletine (MEXITIL) 200 MG capsule, TAKE 1 CAPSULE BY MOUTH THREE TIMES DAILY .  nitroGLYCERIN (NITROSTAT) 0.4 MG SL tablet, Place 1 tablet (0.4 mg total) under the tongue every 5 (five) minutes x 3 doses as needed for chest pain.  Current Outpatient Medications (Respiratory):  .  albuterol (PROVENTIL) (2.5 MG/3ML) 0.083% nebulizer solution, Take 3 mLs (2.5 mg total) by nebulization every 6 (six) hours as needed for wheezing or shortness of breath. .  budesonide-formoterol (SYMBICORT) 160-4.5 MCG/ACT inhaler, Inhale 2 puffs into the lungs 2 (two) times daily. (Patient taking differently: Inhale 2 puffs into the lungs daily. ) .  cetirizine (ZYRTEC) 10 MG tablet, Take 10 mg by mouth daily as needed for allergies. .  Ipratropium-Albuterol (COMBIVENT RESPIMAT) 20-100 MCG/ACT AERS respimat, Inhale 1 puff into the lungs  every 6 (six) hours. Shortness of breath or wheezing (Patient taking differently: Inhale 1 puff into the lungs every 6 (six) hours as needed for wheezing. ) .  Tiotropium Bromide Monohydrate (SPIRIVA RESPIMAT) 2.5 MCG/ACT AERS, Inhale 2 puffs into the lungs daily.   Current Outpatient Medications (Hematological):  .  cyanocobalamin (,VITAMIN B-12,) 1000 MCG/ML injection, Inject 1 mL (1,000 mcg total) into the muscle every 14 (fourteen) days. .  IRON PO, Take by mouth. Alveda Reasons 20 MG TABS tablet, TAKE 1 TABLET BY MOUTH EVERY DAY WITH SUPPER (Patient taking differently: Take 20 mg by mouth daily with supper. )  Current Outpatient Medications (Other):  [name]  Artificial Tear Solution (SOOTHE XP OP), Place 2 drops into both eyes daily as needed (dry eyes). [name]  buPROPion (WELLBUTRIN) 75 MG tablet, Take 1 tablet (75 mg total) by mouth daily. .  busPIRone (BUSPAR) 15 MG tablet, TAKE 1 TABLET BY MOUTH THREE TIMES A DAY .  Cholecalciferol (VITAMIN D3) 2000 units CHEW, Chew 4,000 Units by mouth daily.  [name]  escitalopram (LEXAPRO) 20 MG tablet, TAKE 1 TABLET(20 MG) BY MOUTH DAILY .  gabapentin (NEURONTIN) 300 MG capsule, Take 1 capsule (300 mg total) by mouth 2 (two) times daily. (Patient taking differently: Take 300 mg by mouth 3 (three) times daily. ) .  Magnesium Oxide 400 MG CAPS, Take 1 capsule (400 mg total) by mouth daily. [name]  nystatin (MYCOSTATIN) 100000 UNIT/ML suspension, Take 5 mLs (500,000 Units total) by mouth 2 (two) times daily. [name]  omeprazole (PRILOSEC) 20 MG capsule, Take 20 mg by mouth 2 (two) times daily. .  potassium chloride SA (K-DUR,KLOR-CON) 20 MEQ tablet, Take 1 tablet (20 mEq total) by mouth daily. (Patient taking differently: Take 20 mEq by mouth at bedtime. ) .  Respiratory Therapy Supplies (FLUTTER) DEVI, Use as directed .  Syringe/Needle, Disp, (SYRINGE 3CC/25GX5/8") 25G X 5/8" 3 ML MISC, 1 each by Does not apply route every 14 (fourteen) days. .  tamsulosin (FLOMAX) 0.4 MG CAPS  capsule, TAKE 1 CAPSULE BY MOUTH EVERY DAY AFTER SUPPER (Patient taking differently: Take 0.4 mg by mouth  daily after supper. ) .  baclofen 5 MG TABS, Take 5 mg by mouth 3 (three) times daily.    Past medical history, social, surgical and family history all reviewed in electronic medical record.  No pertanent information unless stated regarding to the chief complaint.   Review of Systems:  No headache, visual changes, nausea, vomiting, diarrhea, constipation, dizziness, abdominal pain, skin rash, fevers, chills, night sweats, weight loss, swollen lymph nodes, body aches, joint swelling,  chest pain, shortness of breath, mood changes.  Positive muscle aches  Objective  Blood pressure 122/72, pulse 73, resp. rate 16, weight 198 lb (89.8 kg), SpO2 96 %.    General: No apparent distress alert and oriented x3 mood and affect normal, dressed appropriately.  HEENT: Pupils equal, extraocular movements intact  Respiratory: Patient's speak in full sentences and does not appear short of breath  Cardiovascular: Trace lower extremity edema, non tender, no erythema  Skin: Multiple senile bruising noted Abdomen: Soft nontender  Neuro: Cranial nerves II through XII are intact, neurovascularly intact in all extremities with 2+ DTRs and 2+ pulses.  Lymph: No lymphadenopathy of posterior or anterior cervical chain or axillae bilaterally.  Gait mild antalgic MSK:  tender with full range of motion and good stability and symmetric strength and tone of shoulders, elbows, wrist, hip, and ankles bilaterally.  Arthritic changes noted as well.  Multiple joints.  Patient in the knees.  Back does show some degenerative scoliosis.  Tenderness to palpation of the paraspinal musculature on the left side.  Patient does have some weakness of the lower extremities 4 out of 5 bilaterally.  Patient does have mild limited range of motion of the hips bilaterally as well.  Neck exam is loss of lordosis.  Patient does have  crepitus noted.  Mild positive Spurling's patient states but no significant weakness of the upper extremities noted.  Pain seems to be a little out of proportion.   Impression and Recommendations:     This case required medical decision making of moderate complexity. The above documentation has been reviewed and is accurate and complete Lyndal Pulley, DO       Note: This dictation was prepared with Dragon dictation along with smaller phrase technology. Any transcriptional errors that result from this process are unintentional.

## 2018-03-09 ENCOUNTER — Ambulatory Visit (INDEPENDENT_AMBULATORY_CARE_PROVIDER_SITE_OTHER): Payer: Medicare Other | Admitting: Family Medicine

## 2018-03-09 ENCOUNTER — Encounter: Payer: Self-pay | Admitting: Family Medicine

## 2018-03-09 ENCOUNTER — Other Ambulatory Visit: Payer: Self-pay | Admitting: Internal Medicine

## 2018-03-09 ENCOUNTER — Other Ambulatory Visit (INDEPENDENT_AMBULATORY_CARE_PROVIDER_SITE_OTHER): Payer: Medicare Other

## 2018-03-09 VITALS — BP 122/72 | HR 73 | Resp 16 | Wt 198.0 lb

## 2018-03-09 DIAGNOSIS — I25810 Atherosclerosis of coronary artery bypass graft(s) without angina pectoris: Secondary | ICD-10-CM

## 2018-03-09 DIAGNOSIS — M4802 Spinal stenosis, cervical region: Secondary | ICD-10-CM

## 2018-03-09 DIAGNOSIS — M545 Low back pain, unspecified: Secondary | ICD-10-CM

## 2018-03-09 DIAGNOSIS — M2550 Pain in unspecified joint: Secondary | ICD-10-CM

## 2018-03-09 DIAGNOSIS — G8929 Other chronic pain: Secondary | ICD-10-CM

## 2018-03-09 DIAGNOSIS — D649 Anemia, unspecified: Secondary | ICD-10-CM

## 2018-03-09 DIAGNOSIS — I255 Ischemic cardiomyopathy: Secondary | ICD-10-CM

## 2018-03-09 DIAGNOSIS — I5022 Chronic systolic (congestive) heart failure: Secondary | ICD-10-CM

## 2018-03-09 LAB — CBC WITH DIFFERENTIAL/PLATELET
BASOS ABS: 0.1 10*3/uL (ref 0.0–0.1)
Basophils Relative: 0.5 % (ref 0.0–3.0)
Eosinophils Absolute: 0.1 10*3/uL (ref 0.0–0.7)
Eosinophils Relative: 0.9 % (ref 0.0–5.0)
HCT: 39.5 % (ref 39.0–52.0)
Hemoglobin: 13 g/dL (ref 13.0–17.0)
Lymphocytes Relative: 10.5 % — ABNORMAL LOW (ref 12.0–46.0)
Lymphs Abs: 1.1 10*3/uL (ref 0.7–4.0)
MCHC: 32.9 g/dL (ref 30.0–36.0)
MCV: 93.4 fl (ref 78.0–100.0)
Monocytes Absolute: 1.2 10*3/uL — ABNORMAL HIGH (ref 0.1–1.0)
Monocytes Relative: 10.7 % (ref 3.0–12.0)
Neutro Abs: 8.3 10*3/uL — ABNORMAL HIGH (ref 1.4–7.7)
Neutrophils Relative %: 77.4 % — ABNORMAL HIGH (ref 43.0–77.0)
Platelets: 208 10*3/uL (ref 150.0–400.0)
RBC: 4.23 Mil/uL (ref 4.22–5.81)
RDW: 18.3 % — ABNORMAL HIGH (ref 11.5–15.5)
WBC: 10.8 10*3/uL — ABNORMAL HIGH (ref 4.0–10.5)

## 2018-03-09 LAB — FERRITIN: Ferritin: 28.6 ng/mL (ref 22.0–322.0)

## 2018-03-09 LAB — COMPREHENSIVE METABOLIC PANEL
ALT: 13 U/L (ref 0–53)
AST: 12 U/L (ref 0–37)
Albumin: 4.1 g/dL (ref 3.5–5.2)
Alkaline Phosphatase: 38 U/L — ABNORMAL LOW (ref 39–117)
BUN: 13 mg/dL (ref 6–23)
CO2: 27 mEq/L (ref 19–32)
Calcium: 9.9 mg/dL (ref 8.4–10.5)
Chloride: 104 mEq/L (ref 96–112)
Creatinine, Ser: 1.08 mg/dL (ref 0.40–1.50)
GFR: 70.04 mL/min (ref >60.00)
Glucose, Bld: 104 mg/dL — ABNORMAL HIGH (ref 70–99)
Potassium: 4.1 mEq/L (ref 3.5–5.1)
Sodium: 138 mEq/L (ref 135–145)
Total Bilirubin: 0.8 mg/dL (ref 0.2–1.2)
Total Protein: 7 g/dL (ref 6.0–8.3)

## 2018-03-09 LAB — IBC PANEL
Iron: 28 ug/dL — ABNORMAL LOW (ref 42–165)
Saturation Ratios: 6.5 % — ABNORMAL LOW (ref 20.0–50.0)
Transferrin: 307 mg/dL (ref 212.0–360.0)

## 2018-03-09 LAB — URIC ACID: Uric Acid, Serum: 3.2 mg/dL — ABNORMAL LOW (ref 4.0–7.8)

## 2018-03-09 LAB — SEDIMENTATION RATE: Sed Rate: 21 mm/hr — ABNORMAL HIGH (ref 0–20)

## 2018-03-09 MED ORDER — BACLOFEN 5 MG PO TABS: 5.0000 mg | ORAL_TABLET | Freq: Three times a day (TID) | ORAL | 1 refills | Status: DC

## 2018-03-09 NOTE — Patient Instructions (Signed)
Good to see you  Ice 20 minutes 2 times daily. Usually after activity and before bed. Labs today  We will get iron and not better we will need infusion  Baclofen up to 3 times a day  See me agani in 6 weeks or write me in 3 weeks and if pain in the back continues will need

## 2018-03-09 NOTE — Assessment & Plan Note (Signed)
Degenerative disc disease of the cervical spine.  Discussed icing regimen and home exercise.  Which activities to do which wants to avoid.  Discussed posture and ergonomics.  Discussed which activities to do which wants to avoid.  Follow-up with me again in 4 to 8 weeks spent  25 minutes with patient face-to-face and had greater than 50% of counseling including as described above in assessment and plan.

## 2018-03-09 NOTE — Assessment & Plan Note (Signed)
Recheck iron levels

## 2018-03-14 ENCOUNTER — Telehealth: Payer: Self-pay | Admitting: Pulmonary Disease

## 2018-03-14 MED ORDER — TIOTROPIUM BROMIDE MONOHYDRATE 2.5 MCG/ACT IN AERS: 2.0000 | INHALATION_SPRAY | Freq: Every day | RESPIRATORY_TRACT | 0 refills | Status: DC

## 2018-03-14 NOTE — Telephone Encounter (Signed)
Called and spoke with patient advised that medication has been sent in. Nothing further needed.

## 2018-03-15 ENCOUNTER — Ambulatory Visit: Payer: Medicare Other | Attending: Family Medicine | Admitting: Physical Therapy

## 2018-03-15 ENCOUNTER — Encounter: Payer: Self-pay | Admitting: Physical Therapy

## 2018-03-15 DIAGNOSIS — M546 Pain in thoracic spine: Secondary | ICD-10-CM | POA: Insufficient documentation

## 2018-03-15 DIAGNOSIS — G8929 Other chronic pain: Secondary | ICD-10-CM | POA: Diagnosis not present

## 2018-03-15 DIAGNOSIS — M542 Cervicalgia: Secondary | ICD-10-CM

## 2018-03-15 DIAGNOSIS — M545 Low back pain: Secondary | ICD-10-CM | POA: Diagnosis not present

## 2018-03-15 NOTE — Therapy (Signed)
[facility] [address]  [place], [place], [address] Phone: [phone]   Fax:  [phone]  Physical Therapy Evaluation  Patient Details  Name: Steven Guzman MRN: [id number] Date of Birth: November 23, 1940 Referring Provider (PT): Hulan Saas DO   Encounter Date: 03/15/2018  PT End of Session - 03/15/18 1413    Visit Number  1    Number of Visits  12    Date for PT Re-Evaluation  04/26/18    Authorization Type  MCR and UHC VL X 60    PT Start Time  2426    PT Stop Time  1400    PT Time Calculation (min)  45 min    Activity Tolerance  Patient tolerated treatment well    Behavior During Therapy  [facility] for tasks assessed/performed       Past Medical History:  Diagnosis Date  . AICD (automatic cardioverter/defibrillator) present   . Aneurysm ([place])    a. Aneurysmal infrarenal aorta up to 33 mm on CT 10/2014, recommended f/u due 10/2017  . Anginal pain ([place])   . Anxiety   . Basal cell carcinoma of nose    S/P MOHS  . Biliary acute pancreatitis   . CAD (coronary artery disease)    a. s/p MI in 1994 with PCI to LAD at that time b. cath 10/2012 demonstrated EF 30%, inferior akinesis with mild hypokinesis of all walls, patent LAD and RCA stents; ostial PDA with 80-90% obstruction with medical therapy recommended   . Chronic systolic CHF (congestive heart failure) (HCC)    EF 30 to 35 % as of 09/2014.   [name] CKD (chronic kidney disease), stage III ([place])   . Complication of anesthesia 10/2014   "had to have defibrillator w/ERCP"  . COPD (chronic obstructive pulmonary disease) ([place])    a. followed by pulmonary, COPD GOLD stage II  . Depression   . Diverticulosis of colon 07/2014   noted on CT  . GERD (gastroesophageal reflux disease)   . Hiatal hernia   . Hyperglycemia 10/2012.  [name] Hyperlipidemia   . Hypertension   . Myocardial infarction [facility]) 1994; 2011  . Pneumonia 1946; 2015  . Prostate enlargement 07/2014   observed  on CT  . Tobacco abuse   . Ventricular tachycardia ([place])    a. 08/2009 s/p BSX E110 Teligen 100 AICD, ser#: [id number];  b. 08/2008 VT req ATP - detection reprogrammed from 160 to 150. c. EPS and VT ablation by Dr. Lovena Le 12/21/2014    Past Surgical History:  Procedure Laterality Date  . BIOPSY  12/21/2017   Procedure: BIOPSY;  Surgeon: [name], MD;  Location: [facility];  Service: Endoscopy;;  . CATARACT EXTRACTION W/ INTRAOCULAR LENS  IMPLANT, BILATERAL Bilateral ~ 2011  . COLONOSCOPY    . COLONOSCOPY WITH PROPOFOL N/A 12/21/2017   Procedure: COLONOSCOPY WITH PROPOFOL;  Surgeon: [name], MD;  [facility];  Service: Endoscopy;  Laterality: N/A;  . ELECTROPHYSIOLOGIC STUDY N/A 12/21/2014   Procedure: V Tach Ablation;  Surgeon: [name], MD;  Location: [facility];  Service: Cardiovascular;  Laterality: N/A;  . ERCP N/A 11/16/2014   Procedure: ENDOSCOPIC RETROGRADE CHOLANGIOPANCREATOGRAPHY (ERCP);  Surgeon: [name], MD;  Location: [facility];  Service: Endoscopy;  Laterality: N/A;  . ESOPHAGOGASTRODUODENOSCOPY (EGD) WITH PROPOFOL N/A 12/21/2017   Procedure: ESOPHAGOGASTRODUODENOSCOPY (EGD) WITH PROPOFOL;  Surgeon: [name], MD;  [facility];  Service: Endoscopy;  Laterality: N/A;  . EYE SURGERY    . FOOT SURGERY Left 2005   "fixed bone that stuck out in my ankle area"  . HEMORRHOID BANDING    . IMPLANTABLE CARDIOVERTER DEFIBRILLATOR IMPLANT  09/06/09   BSX dual chamber ICD implanted in [place] for cardiac arrest 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  . INGUINAL HERNIA REPAIR Right ~ 1995  . LEFT HEART CATHETERIZATION WITH CORONARY ANGIOGRAM N/A 11/25/2012   demonstrated EF 30%, inferior akinesis with mild hypokinesis of all walls, patent LAD and RCA stents; ostial PDA with 80-90% obstruction with medical therapy recommended  . MOHS SURGERY  2008   nose, skin graft  . POLYPECTOMY  12/21/2017   Procedure: POLYPECTOMY;  Surgeon: [name], MD;   Location: [facility];  Service: Endoscopy;;  . RETINAL DETACHMENT SURGERY Right 2013  . TENOLYSIS Right 12/21/2013   Procedure: TENOLYSIS FLEXOR CARPI RADIALIS ,DEBRIDEMENT RIGHT JOINT WRIST,DEBRIDEMENT SCAPHOTRAPEZIAL TRAPEZOID, REPAIR OF EXTENSOR HOOD;  Surgeon: Daryll Brod, MD;  Location: [facility];  Service: Orthopedics;  Laterality: Right;  . V-TACH ABLATION  12/21/2014  . VIDEO BRONCHOSCOPY Bilateral 01/09/2016   Procedure: VIDEO BRONCHOSCOPY WITHOUT FLUORO;  Surgeon: Juanito Doom, MD;  [facility];  Service: Cardiopulmonary;  Laterality: Bilateral;    There were no vitals filed for this visit.   Subjective Assessment - 03/15/18 1315    Subjective  Pt relays back pain since 2012 aggravated by prolonged standing. He thinks the back is causing some balance issues and not allowing his legs to work as well. He denies N/T in his legs. He relays he has lost some muscle mass. He relays neck pain for last 2 months that started after waking up one day.     Limitations  Sitting;House hold activities;Standing    How long can you sit comfortably?  5 min    How long can you stand comfortably?  5 min    How long can you walk comfortably?  10 min    Diagnostic tests  x rays of neck show DDD    Patient Stated Goals  help with the back pain    Currently in Pain?  Yes    Pain Score  7     Pain Location  Back    Pain Orientation  Right;Left;Upper;Mid    Pain Descriptors / Indicators  Aching    Pain Type  Chronic pain    Pain Radiating Towards  denies    Pain Onset  More than a month ago    Pain Frequency  Constant    Aggravating Factors   standing, walking,     Pain Relieving Factors  meds    Multiple Pain Sites  Yes    Pain Score  4    Pain Location  Neck    Pain Orientation  Right;Left    Pain Descriptors / Indicators  Aching    Pain Type  Chronic pain    Pain Radiating Towards  denies    Pain Onset  More than a month ago    Pain Frequency  Intermittent     Aggravating Factors   turning his head    Pain Relieving Factors  meds         [facility] PT Assessment - 03/15/18 0001      Assessment   Medical Diagnosis  Chronic back and neck pain    Referring Provider (PT)  Hulan Saas DO    Onset Date/Surgical Date  --   chronic  Next MD Visit  --   3 weeks   Prior Therapy  yes      Precautions   Precautions  ICD/Pacemaker      Balance Screen   Has the patient fallen in the past 6 months  No      [place] residence    Additional Comments  single level, no stairs      Prior Function   Level of Independence  Independent      Cognition   Overall Cognitive Status  Within Functional Limits for tasks assessed      Observation/Other Assessments   Focus on Therapeutic Outcomes (FOTO)   not set up      Sensation   Light Touch  Appears Intact      Posture/Postural Control   Posture Comments  slumped posture, fwd head, scoliosis      ROM / Strength   AROM / PROM / Strength  AROM;Strength      AROM   AROM Assessment Site  Cervical;Lumbar    Cervical Flexion  WNL    Cervical Extension  25%    Cervical - Right Side Bend  25%    Cervical - Left Side Bend  25%    Cervical - Right Rotation  50%    Cervical - Left Rotation  50%    Lumbar Flexion  75%    Lumbar Extension  25%    Lumbar - Right Side Bend  50%    Lumbar - Left Side Bend  50%    Lumbar - Right Rotation  50%    Lumbar - Left Rotation  50%      Strength   Overall Strength Comments  UE strength 5/5 grossly bilat,bilat hip strength 4/5, bilat knee flexion 5/5, knee ext 4+/5, ankle DF 5/5.      Flexibility   Soft Tissue Assessment /Muscle Length  --   severe H.S, mod  in hips,glutes,traps,paraspinals     Special Tests   Other special tests  neg special testing, pain presents more muscular today                Objective measurements completed on examination: See above findings.      [place] [facility] Adult PT Treatment/Exercise -  03/15/18 0001      Self-Care   Self-Care  --   heat and HEP     Modalities   Modalities  Moist Heat      Moist Heat Therapy   Number Minutes Moist Heat  10 Minutes    Moist Heat Location  Lumbar Spine;Cervical             PT Education - 03/15/18 1411    Education Details  HEP, POC,heat    Person(s) Educated  Patient    Methods  Explanation;Demonstration;Verbal cues;Handout    Comprehension  Verbalized understanding;Need further instruction          PT Long Term Goals - 03/15/18 1418      PT LONG TERM GOAL #1   Title  Pt will be I and compliant with HEP. 6 weeks 04/26/18    Status  New      PT LONG TERM GOAL #2   Title  Pt will improve lumbar and cervical ROM to [facility]. 6 weeks 04/26/18    Status  New      PT LONG TERM GOAL #3   Title  Pt will improve pain levels to overall less than 3-4/10 with usual  activity. 6 weeks 04/26/18    Status  New      PT LONG TERM GOAL #4   Title  Pt will be able to stand at least 20 min before back pain or discomfort. 6 weeks 04/26/18    Status  New      PT LONG TERM GOAL #5   Title  Pt will improve bilat hip strength to at least 4+/5 MMT. 6 weeks 04/26/18    Status  New             Plan - 03/15/18 1414    Clinical Impression Statement  Pt presents with chronic neck and back pain. He does have DDD and scoliosis but pain presents more muscular in nature today and special testing was negative. He does have decreased hip strength, decreased ROM and mobility, and increased tightness and spasms limiting his funcitonal abilities. He will benefit from skilled PT to address his deficits.     History and Personal Factors relevant to plan of care:  [id number] cardioverter/defibrillator present, Aortic aneuysrm,MI,CAD,CHF,CKD,COPD,dep,    Clinical Presentation  Evolving    Clinical Presentation due to:  worsening symptoms    Clinical Decision Making  Moderate    Rehab Potential  Good    Clinical Impairments Affecting Rehab Potential   co morbidities and chronic nature of pain    PT Frequency  2x / week    PT Duration  6 weeks    PT Treatment/Interventions  Cryotherapy;Moist Heat;Traction;Gait training;Therapeutic activities;Therapeutic exercise;Neuromuscular re-education;Passive range of motion;Dry needling;Manual techniques;Spinal Manipulations    PT Next Visit Plan  review hep, gentle ROM to start, progress hip strength and core as able    Consulted and Agree with Plan of Care  Patient       Patient will benefit from skilled therapeutic intervention in order to improve the following deficits and impairments:  Abnormal gait, Decreased activity tolerance, Decreased endurance, Decreased range of motion, Decreased strength, Hypomobility, Decreased balance, Impaired flexibility, Increased fascial restricitons, Increased muscle spasms, Pain  Visit Diagnosis: Chronic bilateral low back pain without sciatica  Cervicalgia  Pain in thoracic spine     Problem List Patient Active Problem List   Diagnosis Date Noted  . Throat and mouth symptom 02/03/2018  . Benign neoplasm of ascending colon   . Benign neoplasm of transverse colon   . Benign neoplasm of descending colon   . Gastroesophageal reflux disease without esophagitis   . Esophageal stricture   . Gastritis and gastroduodenitis   . Degenerative cervical spinal stenosis 10/27/2017  . Carotid bruit 10/27/2017  . B12 deficiency 08/25/2017  . Anemia 06/10/2017  . Left leg pain 04/09/2017  . Right ankle pain 04/09/2017  . Left arm pain 04/09/2017  . BPH (benign prostatic hyperplasia) 12/31/2016  . Arrhythmia 09/15/2016  . Dizzinesses 05/22/2016  . Pancreatitis, chronic ([place]) 08/02/2015  . Mass of throat 06/30/2015  . Depression 05/24/2015  . Atrial fibrillation ([place]) 03/15/2015  . Routine general medical examination at a health care facility 05/12/2014  . Hemoptysis 03/16/2014  . COPD GOLD GRADE C 12/25/2013  . Chronic systolic heart failure ([place]) 07/07/2013   . CAD (coronary artery disease) 11/23/2012  . Cigarette smoker 11/23/2012  . Ventricular tachycardia ([place]) 11/23/2012  . Essential hypertension 11/23/2012  . Hyperlipidemia 11/23/2012  . ICD (implantable cardioverter-defibrillator) in place 11/23/2012    Silvestre Mesi 03/15/2018, 2:36 PM  [facility] [address]  [place], [place], [address] Phone: [phone]  Fax:  [phone]  Name: Steven Guzman MRN: [id number] Date of Birth: 14-Jan-1941

## 2018-03-15 NOTE — Patient Instructions (Signed)
Access Code: [email or web address]  URL: [email or web address]  Date: 03/15/2018  Prepared by: Elsie Ra   Exercises  Seated Cervical Sidebending Stretch - 3 sets - 30 hold - 2x daily - 6x weekly  Seated Cervical Retraction - 10 reps - 3 sets - 2x daily - 6x weekly  Seated Assisted Cervical Rotation with Towel - 10 reps - 1-2 sets - 5 hold - 2x daily - 6x weekly  Standing Shoulder Row with Anchored Resistance - 10 reps - 2-3 sets - 2x daily - 6x weekly  Supine Hamstring Stretch with Strap - 3 reps - 1 sets - 30 hold - 2x daily - 6x weekly  Supine Single Knee to Chest - 3 sets - 30 hold - 2x daily - 6x weekly  Supine March - 10 reps - 1-2 sets - 2x daily - 6x weekly

## 2018-03-17 ENCOUNTER — Encounter: Payer: Self-pay | Admitting: Physical Therapy

## 2018-03-17 ENCOUNTER — Ambulatory Visit: Payer: Medicare Other | Admitting: Physical Therapy

## 2018-03-17 DIAGNOSIS — M545 Low back pain, unspecified: Secondary | ICD-10-CM

## 2018-03-17 DIAGNOSIS — G8929 Other chronic pain: Secondary | ICD-10-CM

## 2018-03-17 DIAGNOSIS — M542 Cervicalgia: Secondary | ICD-10-CM

## 2018-03-17 DIAGNOSIS — M546 Pain in thoracic spine: Secondary | ICD-10-CM | POA: Diagnosis not present

## 2018-03-17 NOTE — Therapy (Signed)
[facility] [address]  [place], [place], [address] Phone: [phone]   Fax:  [phone]  Physical Therapy Treatment  Patient Details  Name: Steven Guzman MRN: [id number] Date of Birth: 21-Nov-1940 Referring Provider (PT): Hulan Saas DO   Encounter Date: 03/17/2018  PT End of Session - 03/17/18 1528    Visit Number  2    Number of Visits  12    Date for PT Re-Evaluation  04/26/18    Authorization Type  MCR and UHC VL X 60    PT Start Time  1528    PT Stop Time  1619    PT Time Calculation (min)  51 min    Activity Tolerance  Patient tolerated treatment well    Behavior During Therapy  [facility] for tasks assessed/performed       Past Medical History:  Diagnosis Date  . AICD (automatic cardioverter/defibrillator) present   . Aneurysm ([place])    a. Aneurysmal infrarenal aorta up to 33 mm on CT 10/2014, recommended f/u due 10/2017  . Anginal pain ([place])   . Anxiety   . Basal cell carcinoma of nose    S/P MOHS  . Biliary acute pancreatitis   . CAD (coronary artery disease)    a. s/p MI in 1994 with PCI to LAD at that time b. cath 10/2012 demonstrated EF 30%, inferior akinesis with mild hypokinesis of all walls, patent LAD and RCA stents; ostial PDA with 80-90% obstruction with medical therapy recommended   . Chronic systolic CHF (congestive heart failure) (HCC)    EF 30 to 35 % as of 09/2014.   [name] CKD (chronic kidney disease), stage III ([place])   . Complication of anesthesia 10/2014   "had to have defibrillator w/ERCP"  . COPD (chronic obstructive pulmonary disease) ([place])    a. followed by pulmonary, COPD GOLD stage II  . Depression   . Diverticulosis of colon 07/2014   noted on CT  . GERD (gastroesophageal reflux disease)   . Hiatal hernia   . Hyperglycemia 10/2012.  [name] Hyperlipidemia   . Hypertension   . Myocardial infarction [facility]) 1994; 2011  . Pneumonia 1946; 2015  . Prostate enlargement 07/2014   observed  on CT  . Tobacco abuse   . Ventricular tachycardia ([place])    a. 08/2009 s/p BSX E110 Teligen 100 AICD, ser#: [id number];  b. 08/2008 VT req ATP - detection reprogrammed from 160 to 150. c. EPS and VT ablation by Dr. Lovena Le 12/21/2014    Past Surgical History:  Procedure Laterality Date  . BIOPSY  12/21/2017   Procedure: BIOPSY;  Surgeon: [name], MD;  Location: [facility];  Service: Endoscopy;;  . CATARACT EXTRACTION W/ INTRAOCULAR LENS  IMPLANT, BILATERAL Bilateral ~ 2011  . COLONOSCOPY    . COLONOSCOPY WITH PROPOFOL N/A 12/21/2017   Procedure: COLONOSCOPY WITH PROPOFOL;  Surgeon: [name], MD;  [facility];  Service: Endoscopy;  Laterality: N/A;  . ELECTROPHYSIOLOGIC STUDY N/A 12/21/2014   Procedure: V Tach Ablation;  Surgeon: [name], MD;  Location: [facility];  Service: Cardiovascular;  Laterality: N/A;  . ERCP N/A 11/16/2014   Procedure: ENDOSCOPIC RETROGRADE CHOLANGIOPANCREATOGRAPHY (ERCP);  Surgeon: [name], MD;  Location: [facility];  Service: Endoscopy;  Laterality: N/A;  . ESOPHAGOGASTRODUODENOSCOPY (EGD) WITH PROPOFOL N/A 12/21/2017   Procedure: ESOPHAGOGASTRODUODENOSCOPY (EGD) WITH PROPOFOL;  Surgeon: [name], MD;  [facility];  Service: Endoscopy;  Laterality: N/A;  . EYE SURGERY    . FOOT SURGERY Left 2005   "fixed bone that stuck out in my ankle area"  . HEMORRHOID BANDING    . IMPLANTABLE CARDIOVERTER DEFIBRILLATOR IMPLANT  09/06/09   BSX dual chamber ICD implanted in [place] for cardiac arrest 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  . INGUINAL HERNIA REPAIR Right ~ 1995  . LEFT HEART CATHETERIZATION WITH CORONARY ANGIOGRAM N/A 11/25/2012   demonstrated EF 30%, inferior akinesis with mild hypokinesis of all walls, patent LAD and RCA stents; ostial PDA with 80-90% obstruction with medical therapy recommended  . MOHS SURGERY  2008   nose, skin graft  . POLYPECTOMY  12/21/2017   Procedure: POLYPECTOMY;  Surgeon: [name], MD;   Location[facility];  Service: Endoscopy;;  . RETINAL DETACHMENT SURGERY Right 2013  . TENOLYSIS Right 12/21/2013   Procedure: TENOLYSIS FLEXOR CARPI RADIALIS ,DEBRIDEMENT RIGHT JOINT WRIST,DEBRIDEMENT SCAPHOTRAPEZIAL TRAPEZOID, REPAIR OF EXTENSOR HOOD;  Surgeon: Daryll Brod, MD;  Location: [facility];  Service: Orthopedics;  Laterality: Right;  . V-TACH ABLATION  12/21/2014  . VIDEO BRONCHOSCOPY Bilateral 01/09/2016   Procedure: VIDEO BRONCHOSCOPY WITHOUT FLUORO;  Surgeon: Juanito Doom, MD;  [facility];  Service: Cardiopulmonary;  Laterality: Bilateral;    There were no vitals filed for this visit.  Subjective Assessment - 03/17/18 1531    Subjective  Pt reports he had an upset stomach yesterday and was only able to do the HEP once.    Limitations  Sitting;Standing;House hold activities    Patient Stated Goals  help with the back pain    Currently in Pain?  Yes    Pain Score  4    up to 8/10 with sitting or standing   Pain Location  Thoracic    Pain Orientation  Left;Posterior;Lateral    Pain Descriptors / Indicators  Jabbing    Pain Type  Chronic pain    Pain Frequency  Constant   varies in intensity                      [facility] Adult PT Treatment/Exercise - 03/17/18 1528      Exercises   Exercises  Lumbar;Neck      Neck Exercises: Seated   Neck Retraction  10 reps;5 secs    Cervical Rotation  Right;Left;10 reps    Cervical Rotation Limitations  + cervical SNAG with towel      Lumbar Exercises: Stretches   Passive Hamstring Stretch  Right;Left;30 seconds;2 reps    Passive Hamstring Stretch Limitations  supine with strap    Single Knee to Chest Stretch  Right;Left;30 seconds;2 reps    Single Knee to Chest Stretch Limitations  opp LE straight    Lower Trunk Rotation  5 reps;10 seconds    Quadruped Mid Back Stretch  30 seconds;2 reps   each   Quadruped Mid Back Stretch Limitations  seated 3 way prayer stretch with green Pball -  limited tolerance for center position d/t COPD and abdominal pressure on diaphragm but good stretch at area of pain complaint noted with lateral stretches    Other Lumbar Stretch Exercise  B open book stretch 5 x 10'      Lumbar Exercises: Standing   Row  Both;10 reps;Theraband;Strengthening    Theraband Level (Row)  Level 2 (Red)    Row Limitations  cues for scap retraction      Lumbar Exercises: Supine   Bent Knee Raise  10  reps;3 seconds    Bent Knee Raise Limitations  brace marching      Neck Exercises: Stretches   Upper Trapezius Stretch  Right;Left;30 seconds;2 reps    Upper Trapezius Stretch Limitations  slight overpressure; cues for neck retraction & avoiding shoulder hike                  PT Long Term Goals - 03/17/18 1615      PT LONG TERM GOAL #1   Title  Pt will be I and compliant with HEP. 6 weeks 04/26/18    Status  On-going      PT LONG TERM GOAL #2   Title  Pt will improve lumbar and cervical ROM to [facility]. 6 weeks 04/26/18    Status  On-going      PT LONG TERM GOAL #3   Title  Pt will improve pain levels to overall less than 3-4/10 with usual activity. 6 weeks 04/26/18    Status  On-going      PT LONG TERM GOAL #4   Title  Pt will be able to stand at least 20 min before back pain or discomfort. 6 weeks 04/26/18    Status  On-going      PT LONG TERM GOAL #5   Title  Pt will improve bilat hip strength to at least 4+/5 MMT. 6 weeks 04/26/18    Status  On-going            Plan - 03/17/18 1619    Clinical Impression Statement  HEP reviewed with pt requiring extensive cueing and repeat instruction for most exercises therefore limited time remaining for exercise progression. Pt noting most discomfort in posterior-lateral L ribcage near tip of scapula, therefore new exercises/stretches tagerting mid-back flexibility with pt noting good stretch where he was experiencing his pain. HEP not updated today due to extent of review necessary with initial HEP but  may consider adding new exercises/stretches as continued benefit noted in future visits.    Rehab Potential  Good    Clinical Impairments Affecting Rehab Potential  co morbidities and chronic nature of pain    PT Frequency  2x / week    PT Duration  6 weeks    PT Treatment/Interventions  Cryotherapy;Moist Heat;Traction;Gait training;Therapeutic activities;Therapeutic exercise;Neuromuscular re-education;Passive range of motion;Dry needling;Manual techniques;Spinal Manipulations    PT Next Visit Plan  review hep as needed, gentle ROM to start, progress hip strength and core as able    Consulted and Agree with Plan of Care  Patient       Patient will benefit from skilled therapeutic intervention in order to improve the following deficits and impairments:  Abnormal gait, Decreased activity tolerance, Decreased endurance, Decreased range of motion, Decreased strength, Hypomobility, Decreased balance, Impaired flexibility, Increased fascial restricitons, Increased muscle spasms, Pain  Visit Diagnosis: Chronic bilateral low back pain without sciatica  Cervicalgia  Pain in thoracic spine     Problem List Patient Active Problem List   Diagnosis Date Noted  . Throat and mouth symptom 02/03/2018  . Benign neoplasm of ascending colon   . Benign neoplasm of transverse colon   . Benign neoplasm of descending colon   . Gastroesophageal reflux disease without esophagitis   . Esophageal stricture   . Gastritis and gastroduodenitis   . Degenerative cervical spinal stenosis 10/27/2017  . Carotid bruit 10/27/2017  . B12 deficiency 08/25/2017  . Anemia 06/10/2017  . Left leg pain 04/09/2017  . Right ankle pain 04/09/2017  . Left arm  pain 04/09/2017  . BPH (benign prostatic hyperplasia) 12/31/2016  . Arrhythmia 09/15/2016  . Dizzinesses 05/22/2016  . Pancreatitis, chronic ([place]) 08/02/2015  . Mass of throat 06/30/2015  . Depression 05/24/2015  . Atrial fibrillation ([place]) 03/15/2015  . Routine  general medical examination at a health care facility 05/12/2014  . Hemoptysis 03/16/2014  . COPD GOLD GRADE C 12/25/2013  . Chronic systolic heart failure ([place]) 07/07/2013  . CAD (coronary artery disease) 11/23/2012  . Cigarette smoker 11/23/2012  . Ventricular tachycardia ([place]) 11/23/2012  . Essential hypertension 11/23/2012  . Hyperlipidemia 11/23/2012  . ICD (implantable cardioverter-defibrillator) in place 11/23/2012    Percival Spanish, PT, MPT 03/17/2018, 6:03 PM  [facility] [address]  [place], [place], [address] Phone: [phone]   Fax:  [phone]  Name: Steven Guzman MRN: [id number] Date of Birth: 08-30-1940

## 2018-03-21 ENCOUNTER — Encounter: Payer: Self-pay | Admitting: Physical Therapy

## 2018-03-21 ENCOUNTER — Other Ambulatory Visit: Payer: Self-pay | Admitting: Internal Medicine

## 2018-03-21 ENCOUNTER — Ambulatory Visit: Payer: Medicare Other | Admitting: Physical Therapy

## 2018-03-21 DIAGNOSIS — M545 Low back pain: Principal | ICD-10-CM

## 2018-03-21 DIAGNOSIS — M542 Cervicalgia: Secondary | ICD-10-CM | POA: Diagnosis not present

## 2018-03-21 DIAGNOSIS — M546 Pain in thoracic spine: Secondary | ICD-10-CM | POA: Diagnosis not present

## 2018-03-21 DIAGNOSIS — G8929 Other chronic pain: Secondary | ICD-10-CM

## 2018-03-21 NOTE — Therapy (Signed)
[facility] [address]  [place], [place], [address] Phone: [phone]   Fax:  [phone]  Physical Therapy Treatment  Patient Details  Name: Steven Guzman MRN: [id number] Date of Birth: [birth year] Referring Provider (PT): Hulan Saas DO   Encounter Date: 03/21/2018  PT End of Session - 03/21/18 1533    Visit Number  3    Number of Visits  12    Date for PT Re-Evaluation  04/26/18    Authorization Type  MCR and UHC VL X 60    PT Start Time  7782    PT Stop Time  1618    PT Time Calculation (min)  45 min    Activity Tolerance  Patient tolerated treatment well    Behavior During Therapy  [facility] for tasks assessed/performed       Past Medical History:  Diagnosis Date  . AICD (automatic cardioverter/defibrillator) present   . Aneurysm ([place])    a. Aneurysmal infrarenal aorta up to 33 mm on CT 10/2014, recommended f/u due 10/2017  . Anginal pain ([place])   . Anxiety   . Basal cell carcinoma of nose    S/P MOHS  . Biliary acute pancreatitis   . CAD (coronary artery disease)    a. s/p MI in 1994 with PCI to LAD at that time b. cath 10/2012 demonstrated EF 30%, inferior akinesis with mild hypokinesis of all walls, patent LAD and RCA stents; ostial PDA with 80-90% obstruction with medical therapy recommended   . Chronic systolic CHF (congestive heart failure) (HCC)    EF 30 to 35 % as of 09/2014.   [name] CKD (chronic kidney disease), stage III ([place])   . Complication of anesthesia 10/2014   "had to have defibrillator w/ERCP"  . COPD (chronic obstructive pulmonary disease) ([place])    a. followed by pulmonary, COPD GOLD stage II  . Depression   . Diverticulosis of colon 07/2014   noted on CT  . GERD (gastroesophageal reflux disease)   . Hiatal hernia   . Hyperglycemia 10/2012.  [name] Hyperlipidemia   . Hypertension   . Myocardial infarction [facility]) 1994; 2011  . Pneumonia 1946; 2015  . Prostate enlargement 07/2014   observed  on CT  . Tobacco abuse   . Ventricular tachycardia ([place])    a. 08/2009 s/p BSX E110 Teligen 100 AICD, ser#: [id number];  b. 08/2008 VT req ATP - detection reprogrammed from 160 to 150. c. EPS and VT ablation by Dr. Lovena Le 12/21/2014    Past Surgical History:  Procedure Laterality Date  . BIOPSY  12/21/2017   Procedure: BIOPSY;  Surgeon: [name], MD;  Location: [facility];  Service: Endoscopy;;  . CATARACT EXTRACTION W/ INTRAOCULAR LENS  IMPLANT, BILATERAL Bilateral ~ 2011  . COLONOSCOPY    . COLONOSCOPY WITH PROPOFOL N/A 12/21/2017   Procedure: COLONOSCOPY WITH PROPOFOL;  Surgeon: [name], MD;  [facility]: [facility];  Service: Endoscopy;  Laterality: N/A;  . ELECTROPHYSIOLOGIC STUDY N/A 12/21/2014   Procedure: V Tach Ablation;  Surgeon: [name], MD;  Location: [facility];  Service: Cardiovascular;  Laterality: N/A;  . ERCP N/A 11/16/2014   Procedure: ENDOSCOPIC RETROGRADE CHOLANGIOPANCREATOGRAPHY (ERCP);  Surgeon: [name], MD;  Location: [facility];  Service: Endoscopy;  Laterality: N/A;  . ESOPHAGOGASTRODUODENOSCOPY (EGD) WITH PROPOFOL N/A 12/21/2017   Procedure: ESOPHAGOGASTRODUODENOSCOPY (EGD) WITH PROPOFOL;  Surgeon: [name], MD;  [facility];  Service: Endoscopy;  Laterality: N/A;  . EYE SURGERY    . FOOT SURGERY Left 2005   "fixed bone that stuck out in my ankle area"  . HEMORRHOID BANDING    . IMPLANTABLE CARDIOVERTER DEFIBRILLATOR IMPLANT  09/06/09   BSX dual chamber ICD implanted in [place] for cardiac arrest 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  . INGUINAL HERNIA REPAIR Right ~ 1995  . LEFT HEART CATHETERIZATION WITH CORONARY ANGIOGRAM N/A 11/25/2012   demonstrated EF 30%, inferior akinesis with mild hypokinesis of all walls, patent LAD and RCA stents; ostial PDA with 80-90% obstruction with medical therapy recommended  . MOHS SURGERY  2008   nose, skin graft  . POLYPECTOMY  12/21/2017   Procedure: POLYPECTOMY;  Surgeon: [name], MD;   Location: [facility];  Service: Endoscopy;;  . RETINAL DETACHMENT SURGERY Right 2013  . TENOLYSIS Right 12/21/2013   Procedure: TENOLYSIS FLEXOR CARPI RADIALIS ,DEBRIDEMENT RIGHT JOINT WRIST,DEBRIDEMENT SCAPHOTRAPEZIAL TRAPEZOID, REPAIR OF EXTENSOR HOOD;  Surgeon: Daryll Brod, MD;  Location: [facility];  Service: Orthopedics;  Laterality: Right;  . V-TACH ABLATION  12/21/2014  . VIDEO BRONCHOSCOPY Bilateral 01/09/2016   Procedure: VIDEO BRONCHOSCOPY WITHOUT FLUORO;  Surgeon: Juanito Doom, MD;  [facility];  Service: Cardiopulmonary;  Laterality: Bilateral;    There were no vitals filed for this visit.  Subjective Assessment - 03/21/18 1537    Subjective  Pt reports pain was increased this morning after spending several hours sitting at the table for dinnner last night and again this morning on the computer. Feeling more comfortable with HEP - denies need for further review today.    Limitations  Sitting;Standing;House hold activities    Patient Stated Goals  help with the back pain    Currently in Pain?  Yes    Pain Score  4     Pain Location  Thoracic    Pain Orientation  Lower;Medial    Pain Type  Chronic pain    Pain Frequency  Intermittent    Pain Score  2    Pain Location  Neck    Pain Orientation  Lower    Pain Descriptors / Indicators  Tightness    Pain Type  Chronic pain                       [facility] Adult PT Treatment/Exercise - 03/21/18 1533      Exercises   Exercises  Lumbar;Neck      Neck Exercises: Theraband   Shoulder External Rotation  10 reps;Red    Shoulder External Rotation Limitations  hooklying - cues for abd bracing & scap retraction    Horizontal ABduction  10 reps;Red    Horizontal ABduction Limitations  hooklying - cues for abd bracing & scap retraction      Lumbar Exercises: Stretches   Quadruped Mid Back Stretch  30 seconds;2 reps   each   Quadruped Mid Back Stretch Limitations  seated 3 way prayer stretch  with green Pball     Other Lumbar Stretch Exercise  B open book stretch 10 x 5"      Lumbar Exercises: Aerobic   Nustep  L5 x 6 min (UE/LE)      Lumbar Exercises: Supine   Dead Bug  10 reps;3 seconds    Bridge  10 reps;5 seconds             PT Education - 03/21/18 1615    Education Details  HEP update  Person(s) Educated  Patient    Methods  Explanation;Demonstration;Handout    Comprehension  Verbalized understanding;Returned demonstration;Need further instruction          PT Long Term Goals - 03/17/18 1615      PT LONG TERM GOAL #1   Title  Pt will be I and compliant with HEP. 6 weeks 04/26/18    Status  On-going      PT LONG TERM GOAL #2   Title  Pt will improve lumbar and cervical ROM to [facility]. 6 weeks 04/26/18    Status  On-going      PT LONG TERM GOAL #3   Title  Pt will improve pain levels to overall less than 3-4/10 with usual activity. 6 weeks 04/26/18    Status  On-going      PT LONG TERM GOAL #4   Title  Pt will be able to stand at least 20 min before back pain or discomfort. 6 weeks 04/26/18    Status  On-going      PT LONG TERM GOAL #5   Title  Pt will improve bilat hip strength to at least 4+/5 MMT. 6 weeks 04/26/18    Status  On-going            Plan - 03/21/18 1541    Clinical Impression Statement  Phil denying need for furhter review of initial HEP but reports some issues with attempting to replicate new exercises perfromed during last session at home. Instructed pt that not all exercises are expected to be duplicated at home but pt insistent that he would like to attempt more exercies at home, therefore reviewed these exercises and progressed core stabilization/proximal strengthening and postural exercises with updated HEP provided at pt request. Pt instructed to complete initial HEP and new HEP 1x/day each. Given complaints of increasing pain with sitting activities, will plan to review postural education on next visit.    Rehab Potential   Good    Clinical Impairments Affecting Rehab Potential  co morbidities and chronic nature of pain    PT Frequency  2x / week    PT Duration  6 weeks    PT Treatment/Interventions  Cryotherapy;Moist Heat;Traction;Gait training;Therapeutic activities;Therapeutic exercise;Neuromuscular re-education;Passive range of motion;Dry needling;Manual techniques;Spinal Manipulations    PT Next Visit Plan  Posture & body mechanics education; review HEP as needed, gentle ROM to start, progress hip strength and core as able    Consulted and Agree with Plan of Care  Patient       Patient will benefit from skilled therapeutic intervention in order to improve the following deficits and impairments:  Abnormal gait, Decreased activity tolerance, Decreased endurance, Decreased range of motion, Decreased strength, Hypomobility, Decreased balance, Impaired flexibility, Increased fascial restricitons, Increased muscle spasms, Pain  Visit Diagnosis: Chronic bilateral low back pain without sciatica  Cervicalgia  Pain in thoracic spine     Problem List Patient Active Problem List   Diagnosis Date Noted  . Throat and mouth symptom 02/03/2018  . Benign neoplasm of ascending colon   . Benign neoplasm of transverse colon   . Benign neoplasm of descending colon   . Gastroesophageal reflux disease without esophagitis   . Esophageal stricture   . Gastritis and gastroduodenitis   . Degenerative cervical spinal stenosis 10/27/2017  . Carotid bruit 10/27/2017  . B12 deficiency 08/25/2017  . Anemia 06/10/2017  . Left leg pain 04/09/2017  . Right ankle pain 04/09/2017  . Left arm pain 04/09/2017  . BPH (benign prostatic  hyperplasia) 12/31/2016  . Arrhythmia 09/15/2016  . Dizzinesses 05/22/2016  . Pancreatitis, chronic ([place]) 08/02/2015  . Mass of throat 06/30/2015  . Depression 05/24/2015  . Atrial fibrillation ([place]) 03/15/2015  . Routine general medical examination at a health care facility 05/12/2014  .  Hemoptysis 03/16/2014  . COPD GOLD GRADE C 12/25/2013  . Chronic systolic heart failure ([place]) 07/07/2013  . CAD (coronary artery disease) 11/23/2012  . Cigarette smoker 11/23/2012  . Ventricular tachycardia ([place]) 11/23/2012  . Essential hypertension 11/23/2012  . Hyperlipidemia 11/23/2012  . ICD (implantable cardioverter-defibrillator) in place 11/23/2012    Percival Spanish, PT, MPT 03/21/2018, 6:14 PM  [facility] [address]  [place], [place], [address] Phone: [phone]   Fax:  [phone]  Name: Steven Guzman MRN: [id number] Date of Birth: Apr 22, 1940

## 2018-03-25 ENCOUNTER — Encounter: Payer: Self-pay | Admitting: Interventional Cardiology

## 2018-03-28 ENCOUNTER — Encounter: Payer: Self-pay | Admitting: Physical Therapy

## 2018-03-28 ENCOUNTER — Ambulatory Visit: Payer: Medicare Other | Admitting: Physical Therapy

## 2018-03-28 DIAGNOSIS — M545 Low back pain, unspecified: Secondary | ICD-10-CM

## 2018-03-28 DIAGNOSIS — G8929 Other chronic pain: Secondary | ICD-10-CM

## 2018-03-28 DIAGNOSIS — M546 Pain in thoracic spine: Secondary | ICD-10-CM | POA: Diagnosis not present

## 2018-03-28 DIAGNOSIS — M542 Cervicalgia: Secondary | ICD-10-CM

## 2018-03-28 NOTE — Patient Instructions (Signed)

## 2018-03-28 NOTE — Therapy (Signed)
[facility] [address]  [place], [place], [address] Phone: [phone]   Fax:  [phone]  Physical Therapy Treatment  Patient Details  Name: Steven Guzman MRN: [id number] Date of Birth: [birth year] Referring Provider (PT): Hulan Saas DO   Encounter Date: 03/28/2018  PT End of Session - 03/28/18 1521    Visit Number  4    Number of Visits  12    Date for PT Re-Evaluation  04/26/18    Authorization Type  MCR and UHC VL X 60    PT Start Time  0626    PT Stop Time  9485    PT Time Calculation (min)  52 min    Activity Tolerance  Patient tolerated treatment well    Behavior During Therapy  [facility] for tasks assessed/performed       Past Medical History:  Diagnosis Date  . AICD (automatic cardioverter/defibrillator) present   . Aneurysm ([place])    a. Aneurysmal infrarenal aorta up to 33 mm on CT 10/2014, recommended f/u due 10/2017  . Anginal pain ([place])   . Anxiety   . Basal cell carcinoma of nose    S/P MOHS  . Biliary acute pancreatitis   . CAD (coronary artery disease)    a. s/p MI in 1994 with PCI to LAD at that time b. cath 10/2012 demonstrated EF 30%, inferior akinesis with mild hypokinesis of all walls, patent LAD and RCA stents; ostial PDA with 80-90% obstruction with medical therapy recommended   . Chronic systolic CHF (congestive heart failure) (HCC)    EF 30 to 35 % as of 09/2014.   [name] CKD (chronic kidney disease), stage III ([place])   . Complication of anesthesia 10/2014   "had to have defibrillator w/ERCP"  . COPD (chronic obstructive pulmonary disease) ([place])    a. followed by pulmonary, COPD GOLD stage II  . Depression   . Diverticulosis of colon 07/2014   noted on CT  . GERD (gastroesophageal reflux disease)   . Hiatal hernia   . Hyperglycemia 10/2012.  [name] Hyperlipidemia   . Hypertension   . Myocardial infarction [facility]) 1994; 2011  . Pneumonia 1946; 2015  . Prostate enlargement 07/2014   observed  on CT  . Tobacco abuse   . Ventricular tachycardia ([place])    a. 08/2009 s/p BSX E110 Teligen 100 AICD, ser#: [id number];  b. 08/2008 VT req ATP - detection reprogrammed from 160 to 150. c. EPS and VT ablation by Dr. Lovena Le 12/21/2014    Past Surgical History:  Procedure Laterality Date  . BIOPSY  12/21/2017   Procedure: BIOPSY;  Surgeon: [name], MD;  Location: [facility];  Service: Endoscopy;;  . CATARACT EXTRACTION W/ INTRAOCULAR LENS  IMPLANT, BILATERAL Bilateral ~ 2011  . COLONOSCOPY    . COLONOSCOPY WITH PROPOFOL N/A 12/21/2017   Procedure: COLONOSCOPY WITH PROPOFOL;  Surgeon: [name], MD;  [facility];  Service: Endoscopy;  Laterality: N/A;  . ELECTROPHYSIOLOGIC STUDY N/A 12/21/2014   Procedure: V Tach Ablation;  Surgeon: [name], MD;  Location: [facility];  Service: Cardiovascular;  Laterality: N/A;  . ERCP N/A 11/16/2014   Procedure: ENDOSCOPIC RETROGRADE CHOLANGIOPANCREATOGRAPHY (ERCP);  Surgeon: [name], MD;  Location: [facility];  Service: Endoscopy;  Laterality: N/A;  . ESOPHAGOGASTRODUODENOSCOPY (EGD) WITH PROPOFOL N/A 12/21/2017   Procedure: ESOPHAGOGASTRODUODENOSCOPY (EGD) WITH PROPOFOL;  Surgeon: [name], MD;  [facility];  Service: Endoscopy;  Laterality: N/A;  . EYE SURGERY    . FOOT SURGERY Left 2005   "fixed bone that stuck out in my ankle area"  . HEMORRHOID BANDING    . IMPLANTABLE CARDIOVERTER DEFIBRILLATOR IMPLANT  09/06/09   BSX dual chamber ICD implanted in [place] for cardiac arrest 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  . INGUINAL HERNIA REPAIR Right ~ 1995  . LEFT HEART CATHETERIZATION WITH CORONARY ANGIOGRAM N/A 11/25/2012   demonstrated EF 30%, inferior akinesis with mild hypokinesis of all walls, patent LAD and RCA stents; ostial PDA with 80-90% obstruction with medical therapy recommended  . MOHS SURGERY  2008   nose, skin graft  . POLYPECTOMY  12/21/2017   Procedure: POLYPECTOMY;  Surgeon: [name], MD;   Location: [facility];  Service: Endoscopy;;  . RETINAL DETACHMENT SURGERY Right 2013  . TENOLYSIS Right 12/21/2013   Procedure: TENOLYSIS FLEXOR CARPI RADIALIS ,DEBRIDEMENT RIGHT JOINT WRIST,DEBRIDEMENT SCAPHOTRAPEZIAL TRAPEZOID, REPAIR OF EXTENSOR HOOD;  Surgeon: Daryll Brod, MD;  Location: [facility];  Service: Orthopedics;  Laterality: Right;  . V-TACH ABLATION  12/21/2014  . VIDEO BRONCHOSCOPY Bilateral 01/09/2016   Procedure: VIDEO BRONCHOSCOPY WITHOUT FLUORO;  Surgeon: Juanito Doom, MD;  [facility];  Service: Cardiopulmonary;  Laterality: Bilateral;    There were no vitals filed for this visit.  Subjective Assessment - 03/28/18 1524    Subjective  Pt reports he "had a bad week with (HEP) exercises" due to sore throat and busy with holidays.    Limitations  Sitting;Standing;House hold activities    Patient Stated Goals  help with the back pain    Currently in Pain?  Yes    Pain Score  3    up to 8/10 earlier today while getting bathed & dressed   Pain Location  Thoracic    Pain Orientation  Lower;Medial    Pain Descriptors / Indicators  Jabbing;Stabbing    Pain Frequency  Intermittent    Pain Score  4    Pain Location  Neck    Pain Orientation  Lower    Pain Descriptors / Indicators  Tightness    Pain Frequency  Intermittent                       [facility] Adult PT Treatment/Exercise - 03/28/18 1521      Self-Care   Self-Care  Posture    Posture  Provided instruction for good postural awareness and proper body mechanics with typical daily tasks.      Exercises   Exercises  Lumbar;Neck      Lumbar Exercises: Stretches   Prone on Elbows Stretch  10 seconds;5 reps    Prone on Elbows Stretch Limitations  cues for neutral cervical spine avoiding excessive neck flexion      Lumbar Exercises: Aerobic   Nustep  L6 x 6 min (UE/LE)      Lumbar Exercises: Standing   Heel Raises  10 reps;3 seconds    Heel Raises Limitations  UE  support on counter    Functional Squats  10 reps;3 seconds    Functional Squats Limitations  counter mini-squat    Scapular Retraction  Both;15 reps;Theraband;Strengthening    Theraband Level (Scapular Retraction)  Level 2 (Red)    Scapular Retraction Limitations  + mini shoulder extension; cues for scap retraction & depression    Row  Both;15 reps;Theraband;Strengthening    Theraband Level (Row)  Level 2 (Red)    Row Limitations  cues for  scap retraction & depression, avoiding shoulder hiking             PT Education - 03/28/18 1548    Education Details  Posture & body mechanics for typical daily tasks    Person(s) Educated  Patient    Methods  Explanation;Demonstration;Handout    Comprehension  Verbalized understanding;Need further instruction          PT Long Term Goals - 03/17/18 1615      PT LONG TERM GOAL #1   Title  Pt will be I and compliant with HEP. 6 weeks 04/26/18    Status  On-going      PT LONG TERM GOAL #2   Title  Pt will improve lumbar and cervical ROM to [facility]. 6 weeks 04/26/18    Status  On-going      PT LONG TERM GOAL #3   Title  Pt will improve pain levels to overall less than 3-4/10 with usual activity. 6 weeks 04/26/18    Status  On-going      PT LONG TERM GOAL #4   Title  Pt will be able to stand at least 20 min before back pain or discomfort. 6 weeks 04/26/18    Status  On-going      PT LONG TERM GOAL #5   Title  Pt will improve bilat hip strength to at least 4+/5 MMT. 6 weeks 04/26/18    Status  On-going            Plan - 03/28/18 1529    Clinical Impression Statement  Phil reporting limited attempts with HEP over past week but was able to try all exercises except seated prayer stretch at table due to no room at table. Provided education in proper sitting posture as well as good posture an body mechanics with typical daily tasks, emphasizing proper positioning/placement with use of laptop and/or tablet as pt reporting this is often a time  where his pain increases with pt acknowledging areas wher he could improve his posture and body mechanics.    Rehab Potential  Good    Clinical Impairments Affecting Rehab Potential  co morbidities and chronic nature of pain    PT Frequency  2x / week    PT Duration  6 weeks    PT Treatment/Interventions  Cryotherapy;Moist Heat;Traction;Gait training;Therapeutic activities;Therapeutic exercise;Neuromuscular re-education;Passive range of motion;Dry needling;Manual techniques;Spinal Manipulations    PT Next Visit Plan  Posture & body mechanics education; review HEP as needed, gentle ROM to start, progress hip strength and core as able    Consulted and Agree with Plan of Care  Patient       Patient will benefit from skilled therapeutic intervention in order to improve the following deficits and impairments:  Abnormal gait, Decreased activity tolerance, Decreased endurance, Decreased range of motion, Decreased strength, Hypomobility, Decreased balance, Impaired flexibility, Increased fascial restricitons, Increased muscle spasms, Pain  Visit Diagnosis: Chronic bilateral low back pain without sciatica  Cervicalgia  Pain in thoracic spine     Problem List Patient Active Problem List   Diagnosis Date Noted  . Throat and mouth symptom 02/03/2018  . Benign neoplasm of ascending colon   . Benign neoplasm of transverse colon   . Benign neoplasm of descending colon   . Gastroesophageal reflux disease without esophagitis   . Esophageal stricture   . Gastritis and gastroduodenitis   . Degenerative cervical spinal stenosis 10/27/2017  . Carotid bruit 10/27/2017  . B12 deficiency 08/25/2017  . Anemia 06/10/2017  .  Left leg pain 04/09/2017  . Right ankle pain 04/09/2017  . Left arm pain 04/09/2017  . BPH (benign prostatic hyperplasia) 12/31/2016  . Arrhythmia 09/15/2016  . Dizzinesses 05/22/2016  . Pancreatitis, chronic ([place]) 08/02/2015  . Mass of throat 06/30/2015  . Depression  05/24/2015  . Atrial fibrillation ([place]) 03/15/2015  . Routine general medical examination at a health care facility 05/12/2014  . Hemoptysis 03/16/2014  . COPD GOLD GRADE C 12/25/2013  . Chronic systolic heart failure ([place]) 07/07/2013  . CAD (coronary artery disease) 11/23/2012  . Cigarette smoker 11/23/2012  . Ventricular tachycardia ([place]) 11/23/2012  . Essential hypertension 11/23/2012  . Hyperlipidemia 11/23/2012  . ICD (implantable cardioverter-defibrillator) in place 11/23/2012    Percival Spanish, PT, MPT 03/28/2018, 6:04 PM  [facility] [address]  [place], [place], [address] Phone: [phone]   Fax:  [phone]  Name: JADD GASIOR MRN: [id number] Date of Birth: [birth year]

## 2018-03-31 ENCOUNTER — Ambulatory Visit (INDEPENDENT_AMBULATORY_CARE_PROVIDER_SITE_OTHER): Payer: Medicare Other

## 2018-03-31 ENCOUNTER — Ambulatory Visit: Payer: Medicare Other | Attending: Family Medicine

## 2018-03-31 DIAGNOSIS — I472 Ventricular tachycardia, unspecified: Secondary | ICD-10-CM

## 2018-03-31 DIAGNOSIS — I255 Ischemic cardiomyopathy: Secondary | ICD-10-CM

## 2018-03-31 DIAGNOSIS — M542 Cervicalgia: Secondary | ICD-10-CM | POA: Diagnosis not present

## 2018-03-31 DIAGNOSIS — M546 Pain in thoracic spine: Secondary | ICD-10-CM | POA: Insufficient documentation

## 2018-03-31 DIAGNOSIS — G8929 Other chronic pain: Secondary | ICD-10-CM | POA: Insufficient documentation

## 2018-03-31 DIAGNOSIS — M545 Low back pain: Secondary | ICD-10-CM | POA: Insufficient documentation

## 2018-03-31 LAB — CUP PACEART REMOTE DEVICE CHECK
Date Time Interrogation Session: 20200102193846
Implantable Lead Implant Date: 20110610
Implantable Lead Implant Date: 20110610
Implantable Lead Location: 753859
Implantable Lead Location: 753860
Implantable Lead Model: 185
Implantable Lead Model: 4135
Implantable Lead Serial Number: 28681386
Implantable Lead Serial Number: 339643
Implantable Pulse Generator Implant Date: 20110610
Pulse Gen Serial Number: 164892

## 2018-03-31 NOTE — Therapy (Signed)
[facility] [address]  [place], [place], [address] Phone: [phone]   Fax:  [phone]  Physical Therapy Treatment  Patient Details  Name: Steven Guzman MRN: [id number] Date of Birth: 1940-06-06 Referring Provider (PT): Hulan Saas DO   Encounter Date: 03/31/2018  PT End of Session - 03/31/18 1531    Visit Number  5    Number of Visits  12    Date for PT Re-Evaluation  04/26/18    Authorization Type  MCR and UHC VL X 60    PT Start Time  1526    PT Stop Time  1610    PT Time Calculation (min)  44 min    Activity Tolerance  Patient tolerated treatment well    Behavior During Therapy  [facility] for tasks assessed/performed       Past Medical History:  Diagnosis Date  . AICD (automatic cardioverter/defibrillator) present   . Aneurysm ([place])    a. Aneurysmal infrarenal aorta up to 33 mm on CT 10/2014, recommended f/u due 10/2017  . Anginal pain ([place])   . Anxiety   . Basal cell carcinoma of nose    S/P MOHS  . Biliary acute pancreatitis   . CAD (coronary artery disease)    a. s/p MI in 1994 with PCI to LAD at that time b. cath 10/2012 demonstrated EF 30%, inferior akinesis with mild hypokinesis of all walls, patent LAD and RCA stents; ostial PDA with 80-90% obstruction with medical therapy recommended   . Chronic systolic CHF (congestive heart failure) (HCC)    EF 30 to 35 % as of 09/2014.   [name] CKD (chronic kidney disease), stage III ([place])   . Complication of anesthesia 10/2014   "had to have defibrillator w/ERCP"  . COPD (chronic obstructive pulmonary disease) ([place])    a. followed by pulmonary, COPD GOLD stage II  . Depression   . Diverticulosis of colon 07/2014   noted on CT  . GERD (gastroesophageal reflux disease)   . Hiatal hernia   . Hyperglycemia 10/2012.  [name] Hyperlipidemia   . Hypertension   . Myocardial infarction [facility]) 1994; 2011  . Pneumonia 1946; 2015  . Prostate enlargement 07/2014   observed on  CT  . Tobacco abuse   . Ventricular tachycardia ([place])    a. 08/2009 s/p BSX E110 Teligen 100 AICD, ser#: [id number];  b. 08/2008 VT req ATP - detection reprogrammed from 160 to 150. c. EPS and VT ablation by Dr. Lovena Le 12/21/2014    Past Surgical History:  Procedure Laterality Date  . BIOPSY  12/21/2017   Procedure: BIOPSY;  Surgeon: [name], MD;  Location: [facility];  Service: Endoscopy;;  . CATARACT EXTRACTION W/ INTRAOCULAR LENS  IMPLANT, BILATERAL Bilateral ~ 2011  . COLONOSCOPY    . COLONOSCOPY WITH PROPOFOL N/A 12/21/2017   Procedure: COLONOSCOPY WITH PROPOFOL;  Surgeon: [name], MD;  [facility]: [facility];  Service: Endoscopy;  Laterality: N/A;  . ELECTROPHYSIOLOGIC STUDY N/A 12/21/2014   Procedure: V Tach Ablation;  Surgeon: [name], MD;  Location: [facility];  Service: Cardiovascular;  Laterality: N/A;  . ERCP N/A 11/16/2014   Procedure: ENDOSCOPIC RETROGRADE CHOLANGIOPANCREATOGRAPHY (ERCP);  Surgeon: [name], MD;  Location: [place];  Service: Endoscopy;  Laterality: N/A;  . ESOPHAGOGASTRODUODENOSCOPY (EGD) WITH PROPOFOL N/A 12/21/2017   Procedure: ESOPHAGOGASTRODUODENOSCOPY (EGD) WITH PROPOFOL;  Surgeon: [name], MD;  [facility];  Service: Endoscopy;  Laterality: N/A;  . EYE SURGERY    . FOOT SURGERY Left 2005   "fixed bone that stuck out in my ankle area"  . HEMORRHOID BANDING    . IMPLANTABLE CARDIOVERTER DEFIBRILLATOR IMPLANT  09/06/09   BSX dual chamber ICD implanted in [place] for cardiac arrest 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  . INGUINAL HERNIA REPAIR Right ~ 1995  . LEFT HEART CATHETERIZATION WITH CORONARY ANGIOGRAM N/A 11/25/2012   demonstrated EF 30%, inferior akinesis with mild hypokinesis of all walls, patent LAD and RCA stents; ostial PDA with 80-90% obstruction with medical therapy recommended  . MOHS SURGERY  2008   nose, skin graft  . POLYPECTOMY  12/21/2017   Procedure: POLYPECTOMY;  Surgeon: [name], MD;   Location: [facility];  Service: Endoscopy;;  . RETINAL DETACHMENT SURGERY Right 2013  . TENOLYSIS Right 12/21/2013   Procedure: TENOLYSIS FLEXOR CARPI RADIALIS ,DEBRIDEMENT RIGHT JOINT WRIST,DEBRIDEMENT SCAPHOTRAPEZIAL TRAPEZOID, REPAIR OF EXTENSOR HOOD;  Surgeon: Daryll Brod, MD;  Location: [place];  Service: Orthopedics;  Laterality: Right;  . V-TACH ABLATION  12/21/2014  . VIDEO BRONCHOSCOPY Bilateral 01/09/2016   Procedure: VIDEO BRONCHOSCOPY WITHOUT FLUORO;  Surgeon: Juanito Doom, MD;  [facility];  Service: Cardiopulmonary;  Laterality: Bilateral;    There were no vitals filed for this visit.  Subjective Assessment - 03/31/18 1527    Subjective  Had increased back pain last night without known trigger.  Notes he is able to walk further before onset of back pain.     Limitations  Sitting;Standing;House hold activities    Diagnostic tests  x rays of neck show DDD    Patient Stated Goals  help with the back pain    Currently in Pain?  Yes    Pain Score  3     Pain Location  Thoracic    Pain Orientation  Lower;Medial    Pain Descriptors / Indicators  Stabbing                       [facility] Adult PT Treatment/Exercise - 03/31/18 1538      Neck Exercises: Standing   Other Standing Exercises  No money with red TB x 10 reps       Neck Exercises: Seated   Neck Retraction  10 reps;5 secs    Neck Retraction Limitations  scapular retraction       Neck Exercises: Supine   Cervical Rotation  Right;Left;10 reps    Cervical Rotation Limitations  5" hold each direction       Lumbar Exercises: Stretches   Passive Hamstring Stretch  Right;Left;30 seconds;2 reps    Passive Hamstring Stretch Limitations  supine with strap    Single Knee to Chest Stretch  Right;Left;30 seconds;2 reps    Single Knee to Chest Stretch Limitations  opp LE straight    Lower Trunk Rotation  5 reps;10 seconds    Lumbar Stabilization Level 1  3 reps;30 seconds     Lumbar Stabilization Level 1 Limitations  green p-ball rollouts seated    Prone on Elbows Stretch  1 rep;60 seconds    Prone on Elbows Stretch Limitations  POE + protraction + chin tuck       Lumbar Exercises: Aerobic   Nustep  L6 x 6 min (UE/LE)      Lumbar Exercises: Standing   Row  Both;10 reps;Theraband;Strengthening    Theraband Level (Row)  Level 3 (Green)    Row Limitations  Cues  to avoid shoulder hike     Shoulder Extension  Both;15 reps;Theraband;Strengthening      Manual Therapy   Manual Therapy  Soft tissue mobilization;Passive ROM    Manual therapy comments  seated     Soft tissue mobilization  STM to L-sided mid thoracic paraspinals     Passive ROM  Manual B UT stretch with therapist x 30 sec each way                   PT Long Term Goals - 03/17/18 1615      PT LONG TERM GOAL #1   Title  Pt will be I and compliant with HEP. 6 weeks 04/26/18    Status  On-going      PT LONG TERM GOAL #2   Title  Pt will improve lumbar and cervical ROM to [facility]. 6 weeks 04/26/18    Status  On-going      PT LONG TERM GOAL #3   Title  Pt will improve pain levels to overall less than 3-4/10 with usual activity. 6 weeks 04/26/18    Status  On-going      PT LONG TERM GOAL #4   Title  Pt will be able to stand at least 20 min before back pain or discomfort. 6 weeks 04/26/18    Status  On-going      PT LONG TERM GOAL #5   Title  Pt will improve bilat hip strength to at least 4+/5 MMT. 6 weeks 04/26/18    Status  On-going            Plan - 03/31/18 1532    Clinical Impression Statement  Phil reporting increased back pain last night without known trigger which was relieved by wife rubbing [place] on his back.  Pt. primary complaint today was L-sided mid back pain which responded well to ROM and gentle strengthening activities today.  Pt. leaving session reporting relief from mid back pain.  Able to progress to green TB rows and pt. noting he is able to walk further since  starting therapy before on set of back pain. Will continue to progress toward goals.      Clinical Impairments Affecting Rehab Potential  co morbidities and chronic nature of pain    PT Frequency  2x / week    PT Duration  6 weeks    PT Treatment/Interventions  Cryotherapy;Moist Heat;Traction;Gait training;Therapeutic activities;Therapeutic exercise;Neuromuscular re-education;Passive range of motion;Dry needling;Manual techniques;Spinal Manipulations    PT Next Visit Plan  Review HEP as needed, gentle ROM to start, progress hip strength and core as able    Consulted and Agree with Plan of Care  Patient       Patient will benefit from skilled therapeutic intervention in order to improve the following deficits and impairments:  Abnormal gait, Decreased activity tolerance, Decreased endurance, Decreased range of motion, Decreased strength, Hypomobility, Decreased balance, Impaired flexibility, Increased fascial restricitons, Increased muscle spasms, Pain  Visit Diagnosis: Chronic bilateral low back pain without sciatica  Cervicalgia  Pain in thoracic spine     Problem List Patient Active Problem List   Diagnosis Date Noted  . Throat and mouth symptom 02/03/2018  . Benign neoplasm of ascending colon   . Benign neoplasm of transverse colon   . Benign neoplasm of descending colon   . Gastroesophageal reflux disease without esophagitis   . Esophageal stricture   . Gastritis and gastroduodenitis   . Degenerative cervical spinal stenosis 10/27/2017  . Carotid bruit  10/27/2017  . B12 deficiency 08/25/2017  . Anemia 06/10/2017  . Left leg pain 04/09/2017  . Right ankle pain 04/09/2017  . Left arm pain 04/09/2017  . BPH (benign prostatic hyperplasia) 12/31/2016  . Arrhythmia 09/15/2016  . Dizzinesses 05/22/2016  . Pancreatitis, chronic ([place]) 08/02/2015  . Mass of throat 06/30/2015  . Depression 05/24/2015  . Atrial fibrillation ([place]) 03/15/2015  . Routine general medical examination  at a health care facility 05/12/2014  . Hemoptysis 03/16/2014  . COPD GOLD GRADE C 12/25/2013  . Chronic systolic heart failure ([place]) 07/07/2013  . CAD (coronary artery disease) 11/23/2012  . Cigarette smoker 11/23/2012  . Ventricular tachycardia ([place]) 11/23/2012  . Essential hypertension 11/23/2012  . Hyperlipidemia 11/23/2012  . ICD (implantable cardioverter-defibrillator) in place 11/23/2012    Bess Harvest, PTA 03/31/18 6:17 PM   [facility] [address]  [place], [place], [address] Phone: [phone]   Fax:  [phone]  Name: Steven Guzman MRN: [id number] Date of Birth: Aug 04, 1940

## 2018-04-01 NOTE — Progress Notes (Signed)
Remote ICD transmission.   

## 2018-04-04 ENCOUNTER — Ambulatory Visit: Payer: Medicare Other

## 2018-04-04 DIAGNOSIS — G8929 Other chronic pain: Secondary | ICD-10-CM | POA: Diagnosis not present

## 2018-04-04 DIAGNOSIS — M542 Cervicalgia: Secondary | ICD-10-CM | POA: Diagnosis not present

## 2018-04-04 DIAGNOSIS — M545 Low back pain: Secondary | ICD-10-CM | POA: Diagnosis not present

## 2018-04-04 DIAGNOSIS — M546 Pain in thoracic spine: Secondary | ICD-10-CM | POA: Diagnosis not present

## 2018-04-04 NOTE — Therapy (Signed)
[facility] [address]  [place], [place], [address] Phone: [phone]   Fax:  [phone]  Physical Therapy Treatment  Patient Details  Name: Steven Guzman MRN: [id number] Date of Birth: 1941/01/21 Referring Provider (PT): Hulan Saas DO   Encounter Date: 04/04/2018  PT End of Session - 04/04/18 1545    Visit Number  6    Number of Visits  12    Date for PT Re-Evaluation  04/26/18    Authorization Type  MCR and UHC VL X 60    PT Start Time  7416    PT Stop Time  1615    PT Time Calculation (min)  45 min    Activity Tolerance  Patient tolerated treatment well    Behavior During Therapy  [facility] for tasks assessed/performed       Past Medical History:  Diagnosis Date  . AICD (automatic cardioverter/defibrillator) present   . Aneurysm ([place])    a. Aneurysmal infrarenal aorta up to 33 mm on CT 10/2014, recommended f/u due 10/2017  . Anginal pain ([place])   . Anxiety   . Basal cell carcinoma of nose    S/P MOHS  . Biliary acute pancreatitis   . CAD (coronary artery disease)    a. s/p MI in 1994 with PCI to LAD at that time b. cath 10/2012 demonstrated EF 30%, inferior akinesis with mild hypokinesis of all walls, patent LAD and RCA stents; ostial PDA with 80-90% obstruction with medical therapy recommended   . Chronic systolic CHF (congestive heart failure) (HCC)    EF 30 to 35 % as of 09/2014.   [name] CKD (chronic kidney disease), stage III ([place])   . Complication of anesthesia 10/2014   "had to have defibrillator w/ERCP"  . COPD (chronic obstructive pulmonary disease) ([place])    a. followed by pulmonary, COPD GOLD stage II  . Depression   . Diverticulosis of colon 07/2014   noted on CT  . GERD (gastroesophageal reflux disease)   . Hiatal hernia   . Hyperglycemia 10/2012.  [name] Hyperlipidemia   . Hypertension   . Myocardial infarction [facility]) 1994; 2011  . Pneumonia 1946; 2015  . Prostate enlargement 07/2014   observed on  CT  . Tobacco abuse   . Ventricular tachycardia ([place])    a. 08/2009 s/p BSX E110 Teligen 100 AICD, ser#: [id number];  b. 08/2008 VT req ATP - detection reprogrammed from 160 to 150. c. EPS and VT ablation by Dr. Lovena Le 12/21/2014    Past Surgical History:  Procedure Laterality Date  . BIOPSY  12/21/2017   Procedure: BIOPSY;  Surgeon: [name], MD;  Location: [facility];  Service: Endoscopy;;  . CATARACT EXTRACTION W/ INTRAOCULAR LENS  IMPLANT, BILATERAL Bilateral ~ 2011  . COLONOSCOPY    . COLONOSCOPY WITH PROPOFOL N/A 12/21/2017   Procedure: COLONOSCOPY WITH PROPOFOL;  Surgeon: [name], MD;  [facility];  Service: Endoscopy;  Laterality: N/A;  . ELECTROPHYSIOLOGIC STUDY N/A 12/21/2014   Procedure: V Tach Ablation;  Surgeon: [name], MD;  Location: [place] [facility];  Service: Cardiovascular;  Laterality: N/A;  . ERCP N/A 11/16/2014   Procedure: ENDOSCOPIC RETROGRADE CHOLANGIOPANCREATOGRAPHY (ERCP);  Surgeon: [name], MD;  Location: [place];  Service: Endoscopy;  Laterality: N/A;  . ESOPHAGOGASTRODUODENOSCOPY (EGD) WITH PROPOFOL N/A 12/21/2017   Procedure: ESOPHAGOGASTRODUODENOSCOPY (EGD) WITH PROPOFOL;  Surgeon: [name], MD;  [facility];  Service: Endoscopy;  Laterality: N/A;  . EYE SURGERY    . FOOT SURGERY Left 2005   "fixed bone that stuck out in my ankle area"  . HEMORRHOID BANDING    . IMPLANTABLE CARDIOVERTER DEFIBRILLATOR IMPLANT  09/06/09   BSX dual chamber ICD implanted in [place] for cardiac arrest 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  . INGUINAL HERNIA REPAIR Right ~ 1995  . LEFT HEART CATHETERIZATION WITH CORONARY ANGIOGRAM N/A 11/25/2012   demonstrated EF 30%, inferior akinesis with mild hypokinesis of all walls, patent LAD and RCA stents; ostial PDA with 80-90% obstruction with medical therapy recommended  . MOHS SURGERY  2008   nose, skin graft  . POLYPECTOMY  12/21/2017   Procedure: POLYPECTOMY;  Surgeon: [name], MD;   Location: [facility];  Service: Endoscopy;;  . RETINAL DETACHMENT SURGERY Right 2013  . TENOLYSIS Right 12/21/2013   Procedure: TENOLYSIS FLEXOR CARPI RADIALIS ,DEBRIDEMENT RIGHT JOINT WRIST,DEBRIDEMENT SCAPHOTRAPEZIAL TRAPEZOID, REPAIR OF EXTENSOR HOOD;  Surgeon: Daryll Brod, MD;  Location: [facility];  Service: Orthopedics;  Laterality: Right;  . V-TACH ABLATION  12/21/2014  . VIDEO BRONCHOSCOPY Bilateral 01/09/2016   Procedure: VIDEO BRONCHOSCOPY WITHOUT FLUORO;  Surgeon: Juanito Doom, MD;  [facility];  Service: Cardiopulmonary;  Laterality: Bilateral;    There were no vitals filed for this visit.  Subjective Assessment - 04/04/18 1534    Subjective  Pt. reporting he sat on bed and looked at tablet for a long time today and had increased neck pain following this.      Diagnostic tests  x rays of neck show DDD    Patient Stated Goals  help with the back pain    Currently in Pain?  No/denies    Pain Score  0-No pain   up to a 8/10 pain while sitting in car    Pain Location  Thoracic    Pain Orientation  Lower;Medial    Pain Descriptors / Indicators  Stabbing    Pain Type  Chronic pain    Pain Onset  More than a month ago    Pain Frequency  Intermittent    Aggravating Factors   standing, walking     Multiple Pain Sites  No    Pain Score  0   up to 6/10 pain when turning to R    Pain Location  Neck    Pain Orientation  Lower    Pain Descriptors / Indicators  Tightness    Pain Type  Chronic pain    Aggravating Factors   turning head to R >L    Pain Relieving Factors  neutral head position          [facility] PT Assessment - 04/04/18 0001      Assessment   Medical Diagnosis  Chronic back and neck pain    Referring Provider (PT)  Hulan Saas DO    Next MD Visit  1.22.19    Prior Therapy  yes                   [facility] Adult PT Treatment/Exercise - 04/04/18 1555      Self-Care   Self-Care  Other Self-Care Comments    Posture  Instruct  pt. in strategies for reducing cervical and back strain with prolonged I-pad use       Neck Exercises: Seated   Neck Retraction  5 secs;15 reps    Neck Retraction Limitations  + scapular retraction       Lumbar Exercises: Stretches  Passive Hamstring Stretch  Right;Left;30 seconds;2 reps    Passive Hamstring Stretch Limitations  supine with strap    Lumbar Stabilization Level 1  3 reps;30 seconds    Lumbar Stabilization Level 1 Limitations  red p-ball rollouts seated      Lumbar Exercises: Seated   Long Arc Quad on [employer]  Right;Left;10 reps   cues required for upright posture    LAQ on Ball Limitations  Seated on red p-ball     Other Seated Lumbar Exercises  B pallof press seated on red p-ball x 10 reps with yellow TB    cues required for upright posture      Lumbar Exercises: Supine   Bridge  5 seconds   x 12 reps     Lumbar Exercises: Quadruped   Straight Leg Raise  10 reps;3 seconds    Straight Leg Raises Limitations  quadruped with peanut p-ball support      Neck Exercises: Stretches   Upper Trapezius Stretch  Right;Left;30 seconds;2 reps    Upper Trapezius Stretch Limitations  cues to avoid shoulder "hike"             PT Education - 04/04/18 1751    Education Details  HEP update    Person(s) Educated  Patient    Methods  Explanation;Demonstration;Verbal cues;Handout    Comprehension  Verbalized understanding;Returned demonstration;Verbal cues required;Need further instruction          PT Long Term Goals - 03/17/18 1615      PT LONG TERM GOAL #1   Title  Pt will be I and compliant with HEP. 6 weeks 04/26/18    Status  On-going      PT LONG TERM GOAL #2   Title  Pt will improve lumbar and cervical ROM to [facility]. 6 weeks 04/26/18    Status  On-going      PT LONG TERM GOAL #3   Title  Pt will improve pain levels to overall less than 3-4/10 with usual activity. 6 weeks 04/26/18    Status  On-going      PT LONG TERM GOAL #4   Title  Pt will be able to stand  at least 20 min before back pain or discomfort. 6 weeks 04/26/18    Status  On-going      PT LONG TERM GOAL #5   Title  Pt will improve bilat hip strength to at least 4+/5 MMT. 6 weeks 04/26/18    Status  On-going            Plan - 04/04/18 1554    Clinical Impression Statement  Phil reporting he had increased neck and thoracic back pain today after "slumping looking at I-pad".  Pt. encouraged to be mindful of proper posture and discussed strategies for reducing neck and back strain with I-pad use.  Tolerated all gentle back and cervical ROM and strengthening activities well today with addition of seated stability activities on p-ball.  Ended visit with pt. reporting he was pain free.  Pt. requesting HEP updated today thus updated HEP with postural strengthening activities.  Will continue to progress toward goals.      Rehab Potential  Good    Clinical Impairments Affecting Rehab Potential  co morbidities and chronic nature of pain    PT Frequency  2x / week    PT Duration  6 weeks    PT Treatment/Interventions  Cryotherapy;Moist Heat;Traction;Gait training;Therapeutic activities;Therapeutic exercise;Neuromuscular re-education;Passive range of motion;Dry needling;Manual techniques;Spinal Manipulations    PT  Next Visit Plan  Review HEP prn; progress hip strength and core as able    Consulted and Agree with Plan of Care  Patient       Patient will benefit from skilled therapeutic intervention in order to improve the following deficits and impairments:  Abnormal gait, Decreased activity tolerance, Decreased endurance, Decreased range of motion, Decreased strength, Hypomobility, Decreased balance, Impaired flexibility, Increased fascial restricitons, Increased muscle spasms, Pain  Visit Diagnosis: Chronic bilateral low back pain without sciatica  Cervicalgia  Pain in thoracic spine     Problem List Patient Active Problem List   Diagnosis Date Noted  . Throat and mouth symptom  02/03/2018  . Benign neoplasm of ascending colon   . Benign neoplasm of transverse colon   . Benign neoplasm of descending colon   . Gastroesophageal reflux disease without esophagitis   . Esophageal stricture   . Gastritis and gastroduodenitis   . Degenerative cervical spinal stenosis 10/27/2017  . Carotid bruit 10/27/2017  . B12 deficiency 08/25/2017  . Anemia 06/10/2017  . Left leg pain 04/09/2017  . Right ankle pain 04/09/2017  . Left arm pain 04/09/2017  . BPH (benign prostatic hyperplasia) 12/31/2016  . Arrhythmia 09/15/2016  . Dizzinesses 05/22/2016  . Pancreatitis, chronic ([place]) 08/02/2015  . Mass of throat 06/30/2015  . Depression 05/24/2015  . Atrial fibrillation ([place]) 03/15/2015  . Routine general medical examination at a health care facility 05/12/2014  . Hemoptysis 03/16/2014  . COPD GOLD GRADE C 12/25/2013  . Chronic systolic heart failure ([place]) 07/07/2013  . CAD (coronary artery disease) 11/23/2012  . Cigarette smoker 11/23/2012  . Ventricular tachycardia ([place]) 11/23/2012  . Essential hypertension 11/23/2012  . Hyperlipidemia 11/23/2012  . ICD (implantable cardioverter-defibrillator) in place 11/23/2012    Bess Harvest, PTA 04/04/18 5:57 PM   [facility] [address]  [place], [place], [address] Phone: [phone]   Fax:  [phone]  Name: Steven Guzman MRN: [id number] Date of Birth: 1941/01/11

## 2018-04-07 ENCOUNTER — Encounter: Payer: Medicare Other | Admitting: Physical Therapy

## 2018-04-11 ENCOUNTER — Ambulatory Visit: Payer: Medicare Other

## 2018-04-11 DIAGNOSIS — G8929 Other chronic pain: Secondary | ICD-10-CM

## 2018-04-11 DIAGNOSIS — M546 Pain in thoracic spine: Secondary | ICD-10-CM

## 2018-04-11 DIAGNOSIS — M545 Low back pain, unspecified: Secondary | ICD-10-CM

## 2018-04-11 DIAGNOSIS — M542 Cervicalgia: Secondary | ICD-10-CM | POA: Diagnosis not present

## 2018-04-11 NOTE — Therapy (Signed)
[facility] [address]  [place], [place], [address] Phone: [phone]   Fax:  [phone]  Physical Therapy Treatment  Patient Details  Name: Steven Guzman MRN: [id number] Date of Birth: [birth year] Referring Provider (PT): Hulan Saas DO   Encounter Date: 04/11/2018  PT End of Session - 04/11/18 1542    Visit Number  7    Number of Visits  12    Date for PT Re-Evaluation  04/26/18    Authorization Type  MCR and UHC VL X 60    PT Start Time  1529    PT Stop Time  6440    PT Time Calculation (min)  45 min    Activity Tolerance  Patient tolerated treatment well    Behavior During Therapy  [facility] for tasks assessed/performed       Past Medical History:  Diagnosis Date  . AICD (automatic cardioverter/defibrillator) present   . Aneurysm ([place])    a. Aneurysmal infrarenal aorta up to 33 mm on CT 10/2014, recommended f/u due 10/2017  . Anginal pain ([place])   . Anxiety   . Basal cell carcinoma of nose    S/P MOHS  . Biliary acute pancreatitis   . CAD (coronary artery disease)    a. s/p MI in 1994 with PCI to LAD at that time b. cath 10/2012 demonstrated EF 30%, inferior akinesis with mild hypokinesis of all walls, patent LAD and RCA stents; ostial PDA with 80-90% obstruction with medical therapy recommended   . Chronic systolic CHF (congestive heart failure) (HCC)    EF 30 to 35 % as of 09/2014.   [name] CKD (chronic kidney disease), stage III ([place])   . Complication of anesthesia 10/2014   "had to have defibrillator w/ERCP"  . COPD (chronic obstructive pulmonary disease) ([place])    a. followed by pulmonary, COPD GOLD stage II  . Depression   . Diverticulosis of colon 07/2014   noted on CT  . GERD (gastroesophageal reflux disease)   . Hiatal hernia   . Hyperglycemia 10/2012.  [name] Hyperlipidemia   . Hypertension   . Myocardial infarction [facility]) 1994; 2011  . Pneumonia 1946; 2015  . Prostate enlargement 07/2014   observed on  CT  . Tobacco abuse   . Ventricular tachycardia ([place])    a. 08/2009 s/p BSX E110 Teligen 100 AICD, ser#: [id number];  b. 08/2008 VT req ATP - detection reprogrammed from 160 to 150. c. EPS and VT ablation by Dr. Lovena Le 12/21/2014    Past Surgical History:  Procedure Laterality Date  . BIOPSY  12/21/2017   Procedure: BIOPSY;  Surgeon: [name], MD;  Location: [facility];  Service: Endoscopy;;  . CATARACT EXTRACTION W/ INTRAOCULAR LENS  IMPLANT, BILATERAL Bilateral ~ 2011  . COLONOSCOPY    . COLONOSCOPY WITH PROPOFOL N/A 12/21/2017   Procedure: COLONOSCOPY WITH PROPOFOL;  Surgeon: [name], MD;  [facility];  Service: Endoscopy;  Laterality: N/A;  . ELECTROPHYSIOLOGIC STUDY N/A 12/21/2014   Procedure: V Tach Ablation;  Surgeon: [name], MD;  Location: [facility];  Service: Cardiovascular;  Laterality: N/A;  . ERCP N/A 11/16/2014   Procedure: ENDOSCOPIC RETROGRADE CHOLANGIOPANCREATOGRAPHY (ERCP);  Surgeon: [name], MD;  Location: [facility];  Service: Endoscopy;  Laterality: N/A;  . ESOPHAGOGASTRODUODENOSCOPY (EGD) WITH PROPOFOL N/A 12/21/2017   Procedure: ESOPHAGOGASTRODUODENOSCOPY (EGD) WITH PROPOFOL;  Surgeon: [name], MD;  [facility];  Service: Endoscopy;  Laterality: N/A;  . EYE SURGERY    . FOOT SURGERY Left 2005   "fixed bone that stuck out in my ankle area"  . HEMORRHOID BANDING    . IMPLANTABLE CARDIOVERTER DEFIBRILLATOR IMPLANT  09/06/09   BSX dual chamber ICD implanted in [place] for cardiac arrest 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  . INGUINAL HERNIA REPAIR Right ~ 1995  . LEFT HEART CATHETERIZATION WITH CORONARY ANGIOGRAM N/A 11/25/2012   demonstrated EF 30%, inferior akinesis with mild hypokinesis of all walls, patent LAD and RCA stents; ostial PDA with 80-90% obstruction with medical therapy recommended  . MOHS SURGERY  2008   nose, skin graft  . POLYPECTOMY  12/21/2017   Procedure: POLYPECTOMY;  Surgeon: [name], MD;   Location: [facility];  Service: Endoscopy;;  . RETINAL DETACHMENT SURGERY Right 2013  . TENOLYSIS Right 12/21/2013   Procedure: TENOLYSIS FLEXOR CARPI RADIALIS ,DEBRIDEMENT RIGHT JOINT WRIST,DEBRIDEMENT SCAPHOTRAPEZIAL TRAPEZOID, REPAIR OF EXTENSOR HOOD;  Surgeon: Daryll Brod, MD;  Location: [facility];  Service: Orthopedics;  Laterality: Right;  . V-TACH ABLATION  12/21/2014  . VIDEO BRONCHOSCOPY Bilateral 01/09/2016   Procedure: VIDEO BRONCHOSCOPY WITHOUT FLUORO;  Surgeon: Juanito Doom, MD;  [facility];  Service: Cardiopulmonary;  Laterality: Bilateral;    There were no vitals filed for this visit.  Subjective Assessment - 04/11/18 1533    Subjective  Pt. reporting he has been somewhat more aware of his posture while standing and using I-pad.      Diagnostic tests  x rays of neck show DDD    Patient Stated Goals  help with the back pain    Currently in Pain?  Yes    Pain Score  0-No pain   up to 8/10 this morning    Pain Location  Thoracic    Pain Orientation  Mid;Medial    Pain Descriptors / Indicators  Stabbing    Pain Type  Chronic pain    Pain Onset  More than a month ago    Pain Frequency  Intermittent    Aggravating Factors   standing, walking     Pain Relieving Factors  [place], sitting down     Multiple Pain Sites  No    Pain Score  0    Pain Location  Neck    Pain Orientation  Lower                       [facility] Adult PT Treatment/Exercise - 04/11/18 1551      Neck Exercises: Seated   Neck Retraction  5 secs;10 reps    Neck Retraction Limitations  + scapular retraction       Lumbar Exercises: Stretches   Single Knee to Chest Stretch  Right;Left;30 seconds;2 reps    Single Knee to Chest Stretch Limitations  opp LE straight    Lumbar Stabilization Level 1  3 reps;30 seconds    Lumbar Stabilization Level 1 Limitations  red p-ball rollouts seated      Lumbar Exercises: Aerobic   Nustep  L6 x 6 min (UE/LE)      Lumbar  Exercises: Standing   Heel Raises  15 reps;3 seconds    Heel Raises Limitations  UE support on counter    Row  Both;15 reps;Theraband    Theraband Level (Row)  Level 3 (Green)      Lumbar Exercises: Supine   Isometric Hip Flexion  10 reps;5 seconds    Isometric Hip Flexion  Limitations  with LE resting on peanut p-ball       Lumbar Exercises: Sidelying   Other Sidelying Lumbar Exercises  B sidelying "open book" stretch 5" x 5 reps       Knee/Hip Exercises: Standing   Hip Flexion  Right;Left;10 reps;Stengthening;Knee straight    Hip Flexion Limitations  red looped TB at ankles    Hip Abduction  Right;Left;10 reps;Knee straight;Stengthening    Abduction Limitations  red looped TB at ankles     Hip Extension  Right;Left;10 reps;Knee straight    Extension Limitations  red looped TB at ankles       Manual Therapy   Manual Therapy  Soft tissue mobilization    Manual therapy comments  seated     Soft tissue mobilization  STM to B UT, cervical paraspinals     Passive ROM  Manual B KTOS, HS stretch with therapist x 30 sec each                  PT Long Term Goals - 03/17/18 1615      PT LONG TERM GOAL #1   Title  Pt will be I and compliant with HEP. 6 weeks 04/26/18    Status  On-going      PT LONG TERM GOAL #2   Title  Pt will improve lumbar and cervical ROM to [facility]. 6 weeks 04/26/18    Status  On-going      PT LONG TERM GOAL #3   Title  Pt will improve pain levels to overall less than 3-4/10 with usual activity. 6 weeks 04/26/18    Status  On-going      PT LONG TERM GOAL #4   Title  Pt will be able to stand at least 20 min before back pain or discomfort. 6 weeks 04/26/18    Status  On-going      PT LONG TERM GOAL #5   Title  Pt will improve bilat hip strength to at least 4+/5 MMT. 6 weeks 04/26/18    Status  On-going            Plan - 04/11/18 1549    Clinical Impression Statement  Pt. reporting primary concern today is mid back pain and intermittent neck pain.   Manual therapy addressing L UT tenderness with good overall response.  Pt. tolerated addition of proximal hip strengthening and progression of scapular strengthening well today.  Reports increased awareness of posture while standing and seated while using I-pad.  Will likely benefit from further instruction to improve postural awareness in future visits.  Progressing well toward goals.      Clinical Impairments Affecting Rehab Potential  co morbidities and chronic nature of pain    PT Frequency  2x / week    PT Duration  6 weeks    PT Treatment/Interventions  Cryotherapy;Moist Heat;Traction;Gait training;Therapeutic activities;Therapeutic exercise;Neuromuscular re-education;Passive range of motion;Dry needling;Manual techniques;Spinal Manipulations    PT Next Visit Plan  Review HEP prn; progress hip strength and core as able    Consulted and Agree with Plan of Care  Patient       Patient will benefit from skilled therapeutic intervention in order to improve the following deficits and impairments:  Abnormal gait, Decreased activity tolerance, Decreased endurance, Decreased range of motion, Decreased strength, Hypomobility, Decreased balance, Impaired flexibility, Increased fascial restricitons, Increased muscle spasms, Pain  Visit Diagnosis: Chronic bilateral low back pain without sciatica  Cervicalgia  Pain in thoracic spine  Problem List Patient Active Problem List   Diagnosis Date Noted  . Throat and mouth symptom 02/03/2018  . Benign neoplasm of ascending colon   . Benign neoplasm of transverse colon   . Benign neoplasm of descending colon   . Gastroesophageal reflux disease without esophagitis   . Esophageal stricture   . Gastritis and gastroduodenitis   . Degenerative cervical spinal stenosis 10/27/2017  . Carotid bruit 10/27/2017  . B12 deficiency 08/25/2017  . Anemia 06/10/2017  . Left leg pain 04/09/2017  . Right ankle pain 04/09/2017  . Left arm pain 04/09/2017  .  BPH (benign prostatic hyperplasia) 12/31/2016  . Arrhythmia 09/15/2016  . Dizzinesses 05/22/2016  . Pancreatitis, chronic ([place]) 08/02/2015  . Mass of throat 06/30/2015  . Depression 05/24/2015  . Atrial fibrillation ([place]) 03/15/2015  . Routine general medical examination at a health care facility 05/12/2014  . Hemoptysis 03/16/2014  . COPD GOLD GRADE C 12/25/2013  . Chronic systolic heart failure ([place]) 07/07/2013  . CAD (coronary artery disease) 11/23/2012  . Cigarette smoker 11/23/2012  . Ventricular tachycardia ([place]) 11/23/2012  . Essential hypertension 11/23/2012  . Hyperlipidemia 11/23/2012  . ICD (implantable cardioverter-defibrillator) in place 11/23/2012    Bess Harvest, PTA 04/11/18 6:33 PM   [facility] [address]  [place], [place], [address] Phone: [phone]   Fax:  [phone]  Name: KAYCEON OKI MRN: [id number] Date of Birth: 1940/04/28

## 2018-04-13 DIAGNOSIS — R070 Pain in throat: Secondary | ICD-10-CM | POA: Diagnosis not present

## 2018-04-13 DIAGNOSIS — F17210 Nicotine dependence, cigarettes, uncomplicated: Secondary | ICD-10-CM | POA: Diagnosis not present

## 2018-04-14 ENCOUNTER — Encounter: Payer: Self-pay | Admitting: Physical Therapy

## 2018-04-14 ENCOUNTER — Ambulatory Visit: Payer: Medicare Other | Admitting: Physical Therapy

## 2018-04-14 DIAGNOSIS — M545 Low back pain, unspecified: Secondary | ICD-10-CM

## 2018-04-14 DIAGNOSIS — M542 Cervicalgia: Secondary | ICD-10-CM

## 2018-04-14 DIAGNOSIS — M546 Pain in thoracic spine: Secondary | ICD-10-CM

## 2018-04-14 DIAGNOSIS — G8929 Other chronic pain: Secondary | ICD-10-CM

## 2018-04-14 NOTE — Therapy (Signed)
[facility] [address]  [place], [place], [address] Phone: [phone]   Fax:  [phone]  Physical Therapy Treatment  Patient Details  Name: Steven Guzman MRN: [id number] Date of Birth: [birth year] Referring Provider (PT): Hulan Saas DO   Encounter Date: 04/14/2018  PT End of Session - 04/14/18 1535    Visit Number  8    Number of Visits  12    Date for PT Re-Evaluation  04/26/18    Authorization Type  MCR and UHC VL X 60    PT Start Time  8756    PT Stop Time  1619    PT Time Calculation (min)  44 min    Activity Tolerance  Patient tolerated treatment well    Behavior During Therapy  [facility] for tasks assessed/performed       Past Medical History:  Diagnosis Date  . AICD (automatic cardioverter/defibrillator) present   . Aneurysm ([place])    a. Aneurysmal infrarenal aorta up to 33 mm on CT 10/2014, recommended f/u due 10/2017  . Anginal pain ([place])   . Anxiety   . Basal cell carcinoma of nose    S/P MOHS  . Biliary acute pancreatitis   . CAD (coronary artery disease)    a. s/p MI in 1994 with PCI to LAD at that time b. cath 10/2012 demonstrated EF 30%, inferior akinesis with mild hypokinesis of all walls, patent LAD and RCA stents; ostial PDA with 80-90% obstruction with medical therapy recommended   . Chronic systolic CHF (congestive heart failure) (HCC)    EF 30 to 35 % as of 09/2014.   [name] CKD (chronic kidney disease), stage III ([place])   . Complication of anesthesia 10/2014   "had to have defibrillator w/ERCP"  . COPD (chronic obstructive pulmonary disease) ([place])    a. followed by pulmonary, COPD GOLD stage II  . Depression   . Diverticulosis of colon 07/2014   noted on CT  . GERD (gastroesophageal reflux disease)   . Hiatal hernia   . Hyperglycemia 10/2012.  [name] Hyperlipidemia   . Hypertension   . Myocardial infarction [facility]) 1994; 2011  . Pneumonia 1946; 2015  . Prostate enlargement 07/2014   observed on  CT  . Tobacco abuse   . Ventricular tachycardia ([place])    a. 08/2009 s/p BSX E110 Teligen 100 AICD, ser#: [id number];  b. 08/2008 VT req ATP - detection reprogrammed from 160 to 150. c. EPS and VT ablation by Dr. Lovena Le 12/21/2014    Past Surgical History:  Procedure Laterality Date  . BIOPSY  12/21/2017   Procedure: BIOPSY;  Surgeon: [name], MD;  Location: [facility];  Service: Endoscopy;;  . CATARACT EXTRACTION W/ INTRAOCULAR LENS  IMPLANT, BILATERAL Bilateral ~ 2011  . COLONOSCOPY    . COLONOSCOPY WITH PROPOFOL N/A 12/21/2017   Procedure: COLONOSCOPY WITH PROPOFOL;  Surgeon: [name], MD;  [facility];  Service: Endoscopy;  Laterality: N/A;  . ELECTROPHYSIOLOGIC STUDY N/A 12/21/2014   Procedure: V Tach Ablation;  Surgeon: [name], MD;  Location: [facility];  Service: Cardiovascular;  Laterality: N/A;  . ERCP N/A 11/16/2014   Procedure: ENDOSCOPIC RETROGRADE CHOLANGIOPANCREATOGRAPHY (ERCP);  Surgeon: [name], MD;  Location: [facility];  Service: Endoscopy;  Laterality: N/A;  . ESOPHAGOGASTRODUODENOSCOPY (EGD) WITH PROPOFOL N/A 12/21/2017   Procedure: ESOPHAGOGASTRODUODENOSCOPY (EGD) WITH PROPOFOL;  Surgeon: [name], MD;  [facility];  Service: Endoscopy;  Laterality: N/A;  . EYE SURGERY    . FOOT SURGERY Left 2005   "fixed bone that stuck out in my ankle area"  . HEMORRHOID BANDING    . IMPLANTABLE CARDIOVERTER DEFIBRILLATOR IMPLANT  09/06/09   BSX dual chamber ICD implanted in [place] for cardiac arrest 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  . INGUINAL HERNIA REPAIR Right ~ 1995  . LEFT HEART CATHETERIZATION WITH CORONARY ANGIOGRAM N/A 11/25/2012   demonstrated EF 30%, inferior akinesis with mild hypokinesis of all walls, patent LAD and RCA stents; ostial PDA with 80-90% obstruction with medical therapy recommended  . MOHS SURGERY  2008   nose, skin graft  . POLYPECTOMY  12/21/2017   Procedure: POLYPECTOMY;  Surgeon: [name], MD;   Location: [facility];  Service: Endoscopy;;  . RETINAL DETACHMENT SURGERY Right 2013  . TENOLYSIS Right 12/21/2013   Procedure: TENOLYSIS FLEXOR CARPI RADIALIS ,DEBRIDEMENT RIGHT JOINT WRIST,DEBRIDEMENT SCAPHOTRAPEZIAL TRAPEZOID, REPAIR OF EXTENSOR HOOD;  Surgeon: Daryll Brod, MD;  Location: [facility];  Service: Orthopedics;  Laterality: Right;  . V-TACH ABLATION  12/21/2014  . VIDEO BRONCHOSCOPY Bilateral 01/09/2016   Procedure: VIDEO BRONCHOSCOPY WITHOUT FLUORO;  Surgeon: Juanito Doom, MD;  [facility];  Service: Cardiopulmonary;  Laterality: Bilateral;    There were no vitals filed for this visit.  Subjective Assessment - 04/14/18 1541    Subjective  Pt reporting "hard workout" at last PT session and states he was sore the next day, but soreness has resolved.    Diagnostic tests  x rays of neck show DDD    Patient Stated Goals  help with the back pain    Currently in Pain?  No/denies                       [facility] Adult PT Treatment/Exercise - 04/14/18 1535      Exercises   Exercises  Lumbar;Neck      Neck Exercises: Prone   Neck Retraction  10 reps;3 secs    Neck Retraction Limitations  in POE      Lumbar Exercises: Stretches   Prone on Elbows Stretch  30 seconds;2 reps    Prone on Elbows Stretch Limitations  cues for neutral cervical spine avoiding excessive neck flexion      Lumbar Exercises: Aerobic   Recumbent Bike  L1 x 6 min      Lumbar Exercises: Standing   Row  Both;15 reps;Theraband    Theraband Level (Row)  Level 3 (Green)    Shoulder Extension  Both;15 reps;Theraband;Strengthening    Theraband Level (Shoulder Extension)  Level 3 (Green)      Lumbar Exercises: Supine   Isometric Hip Flexion  10 reps;5 seconds    Isometric Hip Flexion Limitations  alt R/L      Knee/Hip Exercises: Standing   Hip Flexion  Right;Left;10 reps;Knee straight;Stengthening    Hip Flexion Limitations  red looped TB at ankles - cues for abd  bracing    Hip Abduction  Right;Left;10 reps;Knee straight;Stengthening    Abduction Limitations  red looped TB at ankles - cues for abd bracing    Hip Extension  Right;Left;10 reps;Knee straight    Extension Limitations  red looped TB at ankles - cues for abd bracing             PT Education - 04/14/18 1619    Education Details  HEP update - standing 3 way SLR with red TB, supine hip flexion isometric &  POE cervical retraction per pt request    Person(s) Educated  Patient    Methods  Explanation;Demonstration;Handout    Comprehension  Verbalized understanding;Returned demonstration;Need further instruction          PT Long Term Goals - 03/17/18 1615      PT LONG TERM GOAL #1   Title  Pt will be I and compliant with HEP. 6 weeks 04/26/18    Status  On-going      PT LONG TERM GOAL #2   Title  Pt will improve lumbar and cervical ROM to [facility]. 6 weeks 04/26/18    Status  On-going      PT LONG TERM GOAL #3   Title  Pt will improve pain levels to overall less than 3-4/10 with usual activity. 6 weeks 04/26/18    Status  On-going      PT LONG TERM GOAL #4   Title  Pt will be able to stand at least 20 min before back pain or discomfort. 6 weeks 04/26/18    Status  On-going      PT LONG TERM GOAL #5   Title  Pt will improve bilat hip strength to at least 4+/5 MMT. 6 weeks 04/26/18    Status  On-going            Plan - 04/14/18 1619    Clinical Impression Statement  Phil reporting pain free today and feels that PT has helped significantly. Only 1 visit scheduled after today with pt scheduled to f/u with MD on 04/20/18 - pt indicating desire to transition to HEP, therefore reviewed and updated HEP increasing resistance as appopriate and adding a few exercises that pt found beneficial during prior therapy sessions. Will plan to formally assess progress as of next visit and determine readiness for transition to HEP vs need for continued PT.    Clinical Impairments Affecting Rehab  Potential  co morbidities and chronic nature of pain    PT Frequency  2x / week    PT Duration  6 weeks    PT Treatment/Interventions  Cryotherapy;Moist Heat;Traction;Gait training;Therapeutic activities;Therapeutic exercise;Neuromuscular re-education;Passive range of motion;Dry needling;Manual techniques;Spinal Manipulations    PT Next Visit Plan  MD progress note for appt 04/20/18 - recommdations for continued PT vs transition to HEP; Review HEP PRN; progress hip strength and core as able    Consulted and Agree with Plan of Care  Patient       Patient will benefit from skilled therapeutic intervention in order to improve the following deficits and impairments:  Abnormal gait, Decreased activity tolerance, Decreased endurance, Decreased range of motion, Decreased strength, Hypomobility, Decreased balance, Impaired flexibility, Increased fascial restricitons, Increased muscle spasms, Pain  Visit Diagnosis: Chronic bilateral low back pain without sciatica  Cervicalgia  Pain in thoracic spine     Problem List Patient Active Problem List   Diagnosis Date Noted  . Throat and mouth symptom 02/03/2018  . Benign neoplasm of ascending colon   . Benign neoplasm of transverse colon   . Benign neoplasm of descending colon   . Gastroesophageal reflux disease without esophagitis   . Esophageal stricture   . Gastritis and gastroduodenitis   . Degenerative cervical spinal stenosis 10/27/2017  . Carotid bruit 10/27/2017  . B12 deficiency 08/25/2017  . Anemia 06/10/2017  . Left leg pain 04/09/2017  . Right ankle pain 04/09/2017  . Left arm pain 04/09/2017  . BPH (benign prostatic hyperplasia) 12/31/2016  . Arrhythmia 09/15/2016  . Dizzinesses 05/22/2016  .  Pancreatitis, chronic ([place]) 08/02/2015  . Mass of throat 06/30/2015  . Depression 05/24/2015  . Atrial fibrillation ([place]) 03/15/2015  . Routine general medical examination at a health care facility 05/12/2014  . Hemoptysis 03/16/2014   . COPD GOLD GRADE C 12/25/2013  . Chronic systolic heart failure ([place]) 07/07/2013  . CAD (coronary artery disease) 11/23/2012  . Cigarette smoker 11/23/2012  . Ventricular tachycardia ([place]) 11/23/2012  . Essential hypertension 11/23/2012  . Hyperlipidemia 11/23/2012  . ICD (implantable cardioverter-defibrillator) in place 11/23/2012    Percival Spanish, PT, MPT 04/14/2018, 6:54 PM  [facility] [address]  [place], [place], [address] Phone: [phone]   Fax:  [phone]  Name: Steven Guzman MRN: [id number] Date of Birth: 01-21-1941

## 2018-04-18 ENCOUNTER — Ambulatory Visit: Payer: Medicare Other | Admitting: Physical Therapy

## 2018-04-18 ENCOUNTER — Encounter: Payer: Self-pay | Admitting: Physical Therapy

## 2018-04-18 DIAGNOSIS — G8929 Other chronic pain: Secondary | ICD-10-CM

## 2018-04-18 DIAGNOSIS — M546 Pain in thoracic spine: Secondary | ICD-10-CM | POA: Diagnosis not present

## 2018-04-18 DIAGNOSIS — M542 Cervicalgia: Secondary | ICD-10-CM

## 2018-04-18 DIAGNOSIS — M545 Low back pain, unspecified: Secondary | ICD-10-CM

## 2018-04-18 NOTE — Patient Instructions (Signed)
Community Occupational psychologist of Services Cost  A Matter of Balance Class locations vary. Call [place] on Aging for more information.  http://dawson-may.com/ [phone] 8-Session program addressing the fear of falling and increasing activity levels of older adults Free to minimal cost  A.C.T. By [employer] [address] [place], [place] [address].  BetaBlues.dk [phone]  Personal training, gym, classes including Silver Sneakers* and ACTion for Aging Adults Fee-based  A.H.O.Y. (Add Health to [place]) Airs on Time [employer] 13, M-F at [place]: [employer],  [place] [facility] [place],  [place], [place] [facility], [address] [facility], [place], [place], [place] [address]  [place] Location: Sharrell Ku. [employer] [place]      [phone]  [phone]  [phone]  [phone]  [phone]  [phone]  [phone]  [phone]  [phone]  [phone]    [phone] A total-body conditioning class for adults 68 and older; designed to increase muscular strength, endurance, range of movement, flexibility, balance, agility and coordination Free  [facility] [place], [place] [address] [place]      [address]      [phone]      Pilate's class for individualsreturning to exercise after an injury, before or after surgery or for individuals with complex musculoskeletal issues; designed to improve strength, balance , flexibility      $15/class  [place] [address] [place], [place] [address] www.[email or web address] [place] classes for beginners to advanced [place], [place] [address] Seniorcenter_0 -[email or web address] [place] Chair Exercises Free, ages 43 and older; Ages 96-59 fee based  Marvia Pickles, [facility] [address] [place], [place] [address] Seniorcenter_1 .Beverlee Nims [phone]  A.H.O.Y. Tai Chi Fee-based Donation based or free  [place]ma Class locations vary.  Call or email Angela Burke or view website for more information. Info_2 .com GainPain.com.cy.html [phone] Ongoing classes at local YMCAs and gyms Fee-based  Silver Sneakers A.C.T. By [place] [employer]: [employer] [place] [phone]  [phone] Classes designed for older adults who want to improve their strength, flexibility, balance and endurance.   Silver sneakers is covered by some insurance plans and includes a fitness center membership at participating locations. Find out more by calling [phone] or visiting [email or web address] Covered by some insurance plans  [facility] [place] [phone] A.H.O.Y., fitness room, personal training, fitness classes for injury prevention, strength, balance, flexibility, water fitness classes Ages 55+: $85 for 6 months; Ages 50-54: $73 for 6 months  Tai Chi for Everybody [facility] [address] [place], [place] [address] Taichiforeverybody_3 .Patsi Sears [phone] Tai Chi classes for beginners to advanced; geared for seniors Donation Based  [employer] ([place] Others Participate in Exercise     Loyal Gambler. Rosana Hoes, PhD, [place] pgdavis_0 .edu [place]     [phone]     A comprehensive fitness program for adults.  The program paris senior-level undergraduates Kinesiology students with adults who desire to learn how to exercise safely.  Includes a structural exercise class focusing on functional fitnesss     $100/semester in fall and spring; $75 in summer (no trainers)    *Silver Sneakers is covered by some Personal assistant and includes a  Radio producer at participating locations.  Find out more by calling [phone] or visiting [email or web address]  For additional health and human services resources for senior adults, please contact SeniorLine at [phone] in [place] and [place] at [phone] in all other areas.

## 2018-04-18 NOTE — Therapy (Addendum)
[place] [address]  [place], [place], [address] Phone: [phone]   Fax:  [phone]  Physical Therapy Treatment / Discharge Summary  Patient Details  Name: Steven Guzman MRN: [id number] Date of Birth: [birth year] Referring Provider (PT): Hulan Saas DO   Encounter Date: 04/18/2018  PT End of Session - 04/18/18 1533    Visit Number  9    Number of Visits  12    Date for PT Re-Evaluation  04/26/18    Authorization Type  MCR and UHC VL X 60    PT Start Time  5686    PT Stop Time  1611    PT Time Calculation (min)  38 min    Activity Tolerance  Patient tolerated treatment well    Behavior During Therapy  [facility] for tasks assessed/performed       Past Medical History:  Diagnosis Date  . AICD (automatic cardioverter/defibrillator) present   . Aneurysm ([place])    a. Aneurysmal infrarenal aorta up to 33 mm on CT 10/2014, recommended f/u due 10/2017  . Anginal pain ([place])   . Anxiety   . Basal cell carcinoma of nose    S/P MOHS  . Biliary acute pancreatitis   . CAD (coronary artery disease)    a. s/p MI in 1994 with PCI to LAD at that time b. cath 10/2012 demonstrated EF 30%, inferior akinesis with mild hypokinesis of all walls, patent LAD and RCA stents; ostial PDA with 80-90% obstruction with medical therapy recommended   . Chronic systolic CHF (congestive heart failure) (HCC)    EF 30 to 35 % as of 09/2014.   [name] CKD (chronic kidney disease), stage III ([place])   . Complication of anesthesia 10/2014   "had to have defibrillator w/ERCP"  . COPD (chronic obstructive pulmonary disease) ([place])    a. followed by pulmonary, COPD GOLD stage II  . Depression   . Diverticulosis of colon 07/2014   noted on CT  . GERD (gastroesophageal reflux disease)   . Hiatal hernia   . Hyperglycemia 10/2012.  [name] Hyperlipidemia   . Hypertension   . Myocardial infarction [facility]) 1994; 2011  . Pneumonia 1946; 2015  . Prostate enlargement  07/2014   observed on CT  . Tobacco abuse   . Ventricular tachycardia ([place])    a. 08/2009 s/p BSX E110 Teligen 100 AICD, ser#: [id number];  b. 08/2008 VT req ATP - detection reprogrammed from 160 to 150. c. EPS and VT ablation by Dr. Lovena Le 12/21/2014    Past Surgical History:  Procedure Laterality Date  . BIOPSY  12/21/2017   Procedure: BIOPSY;  Surgeon: [name], MD;  [facility];  Service: Endoscopy;;  . CATARACT EXTRACTION W/ INTRAOCULAR LENS  IMPLANT, BILATERAL Bilateral ~ 2011  . COLONOSCOPY    . COLONOSCOPY WITH PROPOFOL N/A 12/21/2017   Procedure: COLONOSCOPY WITH PROPOFOL;  Surgeon: [name], MD;  [facility];  Service: Endoscopy;  Laterality: N/A;  . ELECTROPHYSIOLOGIC STUDY N/A 12/21/2014   Procedure: V Tach Ablation;  Surgeon: [name], MD;  Location: [facility];  Service: Cardiovascular;  Laterality: N/A;  . ERCP N/A 11/16/2014   Procedure: ENDOSCOPIC RETROGRADE CHOLANGIOPANCREATOGRAPHY (ERCP);  Surgeon: [name], MD;  Location: [facility];  Service: Endoscopy;  Laterality: N/A;  . ESOPHAGOGASTRODUODENOSCOPY (EGD) WITH PROPOFOL N/A 12/21/2017   Procedure: ESOPHAGOGASTRODUODENOSCOPY (EGD) WITH PROPOFOL;  Surgeon: [name], MD;  [facility];  Service: Endoscopy;  Laterality: N/A;  . EYE SURGERY    . FOOT SURGERY Left 2005   "fixed bone that stuck out in my ankle area"  . HEMORRHOID BANDING    . IMPLANTABLE CARDIOVERTER DEFIBRILLATOR IMPLANT  09/06/09   BSX dual chamber ICD implanted in [place] for cardiac arrest 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  . INGUINAL HERNIA REPAIR Right ~ 1995  . LEFT HEART CATHETERIZATION WITH CORONARY ANGIOGRAM N/A 11/25/2012   demonstrated EF 30%, inferior akinesis with mild hypokinesis of all walls, patent LAD and RCA stents; ostial PDA with 80-90% obstruction with medical therapy recommended  . MOHS SURGERY  2008   nose, skin graft  . POLYPECTOMY  12/21/2017   Procedure: POLYPECTOMY;  Surgeon:  [name], MD;  [facility];  Service: Endoscopy;;  . RETINAL DETACHMENT SURGERY Right 2013  . TENOLYSIS Right 12/21/2013   Procedure: TENOLYSIS FLEXOR CARPI RADIALIS ,DEBRIDEMENT RIGHT JOINT WRIST,DEBRIDEMENT SCAPHOTRAPEZIAL TRAPEZOID, REPAIR OF EXTENSOR HOOD;  Surgeon: Daryll Brod, MD;  Location: [facility];  Service: Orthopedics;  Laterality: Right;  . V-TACH ABLATION  12/21/2014  . VIDEO BRONCHOSCOPY Bilateral 01/09/2016   Procedure: VIDEO BRONCHOSCOPY WITHOUT FLUORO;  Surgeon: Juanito Doom, MD;  [facility];  Service: Cardiopulmonary;  Laterality: Bilateral;    There were no vitals filed for this visit.  Subjective Assessment - 04/18/18 1538    Subjective  Pt denies neck or back pain but notes his L hip is "a little sore". Still feels ready to transition to HEP as planned.    Limitations  Standing;Walking;House hold activities    How long can you sit comfortably?  >/= 1 hr    How long can you stand comfortably?  at least 30 minutes    How long can you walk comfortably?  at least 30 minutes    Diagnostic tests  x rays of neck show DDD    Patient Stated Goals  help with the back pain    Currently in Pain?  No/denies         [facility] PT Assessment - 04/18/18 1533      Assessment   Medical Diagnosis  Chronic back and neck pain    Referring Provider (PT)  Hulan Saas DO    Onset Date/Surgical Date  --   chronic   Next MD Visit  04/20/18      AROM   Cervical Flexion  WFL    Cervical Extension  36 dg- 50%    Cervical - Right Side Bend  20 dg - 40%    Cervical - Left Side Bend  20 dg - 40%    Cervical - Right Rotation  60 dg - 75%    Cervical - Left Rotation  61 dg - 75%    Lumbar Flexion  80%    Lumbar Extension  50%    Lumbar - Right Side Bend  [facility]    Lumbar - Left Side Bend  WFL    Lumbar - Right Rotation  [facility]    Lumbar - Left Rotation  [facility]      Strength   Right Hip Flexion  5/5    Right Hip Extension  4+/5    Right Hip  External Rotation   5/5    Right Hip Internal Rotation  5/5    Right Hip ABduction  5/5    Right Hip ADduction  4+/5    Left Hip Flexion  5/5    Left Hip Extension  4+/5  Left Hip External Rotation  5/5    Left Hip Internal Rotation  5/5    Left Hip ABduction  5/5    Left Hip ADduction  4+/5    Right Knee Flexion  5/5    Right Knee Extension  5/5    Left Knee Flexion  5/5    Left Knee Extension  5/5                   [facility] Adult PT Treatment/Exercise - 04/18/18 1533      Exercises   Exercises  Lumbar;Neck      Lumbar Exercises: Aerobic   Recumbent Bike  L2 x 6 min             PT Education - 04/18/18 1611    Education Details  Community based exercise programs geared toward seniors; 30 day hold policy    Person(s) Educated  Patient    Methods  Explanation;Handout    Comprehension  Verbalized understanding          PT Long Term Goals - 04/18/18 1539      PT LONG TERM GOAL #1   Title  Pt will be I and compliant with HEP. 6 weeks 04/26/18    Status  Achieved      PT LONG TERM GOAL #2   Title  Pt will improve lumbar and cervical ROM to [facility]. 6 weeks 04/26/18    Status  Partially Met      PT LONG TERM GOAL #3   Title  Pt will improve pain levels to overall less than 3-4/10 with usual activity. 6 weeks 04/26/18    Status  Achieved      PT LONG TERM GOAL #4   Title  Pt will be able to stand at least 20 min before back pain or discomfort. 6 weeks 04/26/18    Status  Achieved      PT LONG TERM GOAL #5   Title  Pt will improve bilat hip strength to at least 4+/5 MMT. 6 weeks 04/26/18    Status  Achieved            Plan - 04/18/18 1540    Clinical Impression Statement  Steven Guzman is very pleased with his progress reporting no current neck or back pain with good relief from therapy sessions and HEP. Cervical and lumbar ROM improved and mostly WFL with exceptions of B cervical lateral flexion. B LE strength improved to grossly 4+/5 to 5/5. Steven Guzman reporting  improved positional tolerance for all activites, although he still admits to need for ongoing postural awareness in relation to cervical retraction. Goals mostly met and Steven Guzman feels confident transitioning to HEP at this time. He was also provided with community resources for senior based exercise programs in the community to continue to facilitate increased activity/exercise. Will proceed with transition to HEP at this time per patient preference and place him on hold for 30 days in the event that issues arise necessitating return to PT.    Rehab Potential  Good    Clinical Impairments Affecting Rehab Potential  co morbidities and chronic nature of pain    PT Frequency  2x / week    PT Duration  6 weeks    PT Treatment/Interventions  Cryotherapy;Moist Heat;Traction;Gait training;Therapeutic activities;Therapeutic exercise;Neuromuscular re-education;Passive range of motion;Dry needling;Manual techniques;Spinal Manipulations    PT Next Visit Plan  30 day hold    Consulted and Agree with Plan of Care  Patient  Patient will benefit from skilled therapeutic intervention in order to improve the following deficits and impairments:  Abnormal gait, Decreased activity tolerance, Decreased endurance, Decreased range of motion, Decreased strength, Hypomobility, Decreased balance, Impaired flexibility, Increased fascial restricitons, Increased muscle spasms, Pain  Visit Diagnosis: Chronic bilateral low back pain without sciatica  Cervicalgia  Pain in thoracic spine     Problem List Patient Active Problem List   Diagnosis Date Noted  . Throat and mouth symptom 02/03/2018  . Benign neoplasm of ascending colon   . Benign neoplasm of transverse colon   . Benign neoplasm of descending colon   . Gastroesophageal reflux disease without esophagitis   . Esophageal stricture   . Gastritis and gastroduodenitis   . Degenerative cervical spinal stenosis 10/27/2017  . Carotid bruit 10/27/2017  . B12  deficiency 08/25/2017  . Anemia 06/10/2017  . Left leg pain 04/09/2017  . Right ankle pain 04/09/2017  . Left arm pain 04/09/2017  . BPH (benign prostatic hyperplasia) 12/31/2016  . Arrhythmia 09/15/2016  . Dizzinesses 05/22/2016  . Pancreatitis, chronic ([place]) 08/02/2015  . Mass of throat 06/30/2015  . Depression 05/24/2015  . Atrial fibrillation ([place]) 03/15/2015  . Routine general medical examination at a health care facility 05/12/2014  . Hemoptysis 03/16/2014  . COPD GOLD GRADE C 12/25/2013  . Chronic systolic heart failure ([place]) 07/07/2013  . CAD (coronary artery disease) 11/23/2012  . Cigarette smoker 11/23/2012  . Ventricular tachycardia ([place]) 11/23/2012  . Essential hypertension 11/23/2012  . Hyperlipidemia 11/23/2012  . ICD (implantable cardioverter-defibrillator) in place 11/23/2012    Percival Spanish, PT, MPT 04/18/2018, 5:51 PM  [facility] [address]  [place], [place], [address] Phone: [phone]   Fax:  [phone]  Name: Steven Guzman MRN: [id number] Date of Birth: 06-Jan-1941  PHYSICAL THERAPY DISCHARGE SUMMARY  Visits from Start of Care: 9  Current functional level related to goals / functional outcomes:   Refer to above clinical impression for status as of last visit on 04/18/18. Patient was placed on hold for 30 days and has not needed to return to PT, therefore will proceed with discharge from PT for this episode.   Remaining deficits:   As above.    Education / Equipment:   HEP, Training and development officer education  Plan: Patient agrees to discharge.  Patient goals were mostly met. Patient is being discharged due to being pleased with the current functional level.  ?????     Percival Spanish, PT, MPT 06/07/18, 8:36 AM  [facility] [address]  [place], [place], [address] Phone: [phone]   Fax:   [phone]

## 2018-04-19 ENCOUNTER — Other Ambulatory Visit: Payer: Self-pay | Admitting: Internal Medicine

## 2018-04-19 NOTE — Progress Notes (Signed)
[facility] [address] [place], [place] [address] Phone: [phone] Subjective:     CC: Back and neck pain follow-up  ZYS:[id number]  Steven Guzman is a 78 y.o. male coming in with complaint of neck and back pain. Patient has been going to physical therapy. Has not noticed as much improvement in his neck pain with therapy. Does feel like therapy decreases cervical spine tightness.   Does feel like his lower back pain is progressing in the right direction. Has localized pain in the left thoracic spine near the ribs. Pain increase with standing. Pain is sharp in character.  Patient states that it is more of a spasm and points to the mid back.  Little different than the lower back pain that he was having previously.  Has been working with physical therapy and trying to bring progress      Past Medical History:  Diagnosis Date  . AICD (automatic cardioverter/defibrillator) present   . Aneurysm ([place])    a. Aneurysmal infrarenal aorta up to 33 mm on CT 10/2014, recommended f/u due 10/2017  . Anginal pain ([place])   . Anxiety   . Basal cell carcinoma of nose    S/P MOHS  . Biliary acute pancreatitis   . CAD (coronary artery disease)    a. s/p MI in 1994 with PCI to LAD at that time b. cath 10/2012 demonstrated EF 30%, inferior akinesis with mild hypokinesis of all walls, patent LAD and RCA stents; ostial PDA with 80-90% obstruction with medical therapy recommended   . Chronic systolic CHF (congestive heart failure) ([facility])    EF 30 to 35 % as of 09/2014.   [name] CKD (chronic kidney disease), stage III ([place])   . Complication of anesthesia 10/2014   "had to have defibrillator w/ERCP"  . COPD (chronic obstructive pulmonary disease) ([place])    a. followed by pulmonary, COPD GOLD stage II  . Depression   . Diverticulosis of colon 07/2014   noted on CT  . GERD (gastroesophageal reflux disease)   . Hiatal hernia   . Hyperglycemia 10/2012.  [name] Hyperlipidemia   .  Hypertension   . Myocardial infarction [facility]) 1994; 2011  . Pneumonia 1946; 2015  . Prostate enlargement 07/2014   observed on CT  . Tobacco abuse   . Ventricular tachycardia ([place])    a. 08/2009 s/p BSX E110 Teligen 100 AICD, ser#: [id number];  b. 08/2008 VT req ATP - detection reprogrammed from 160 to 150. c. EPS and VT ablation by Dr. Lovena Le 12/21/2014   Past Surgical History:  Procedure Laterality Date  . BIOPSY  12/21/2017   Procedure: BIOPSY;  Surgeon: [name], MD;  Location: [facility];  Service: Endoscopy;;  . CATARACT EXTRACTION W/ INTRAOCULAR LENS  IMPLANT, BILATERAL Bilateral ~ 2011  . COLONOSCOPY    . COLONOSCOPY WITH PROPOFOL N/A 12/21/2017   Procedure: COLONOSCOPY WITH PROPOFOL;  Surgeon: [name], MD;  Location: [facility];  Service: Endoscopy;  Laterality: N/A;  . ELECTROPHYSIOLOGIC STUDY N/A 12/21/2014   Procedure: V Tach Ablation;  Surgeon: [name], MD;  Location: [facility];  Service: Cardiovascular;  Laterality: N/A;  . ERCP N/A 11/16/2014   Procedure: ENDOSCOPIC RETROGRADE CHOLANGIOPANCREATOGRAPHY (ERCP);  Surgeon: [name], MD;  Location: [facility];  Service: Endoscopy;  Laterality: N/A;  . ESOPHAGOGASTRODUODENOSCOPY (EGD) WITH PROPOFOL N/A 12/21/2017   Procedure: ESOPHAGOGASTRODUODENOSCOPY (EGD) WITH PROPOFOL;  Surgeon: [name], MD;  Location: [facility];  Service: Endoscopy;  Laterality: N/A;  . EYE SURGERY    . FOOT SURGERY Left 2005   "fixed bone that stuck out in my ankle area"  . HEMORRHOID BANDING    . IMPLANTABLE CARDIOVERTER DEFIBRILLATOR IMPLANT  09/06/09   BSX dual chamber ICD implanted in [place] for cardiac arrest 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  . INGUINAL HERNIA REPAIR Right ~ 1995  . LEFT HEART CATHETERIZATION WITH CORONARY ANGIOGRAM N/A 11/25/2012   demonstrated EF 30%, inferior akinesis with mild hypokinesis of all walls, patent LAD and RCA stents; ostial PDA with 80-90% obstruction with medical therapy recommended  .  MOHS SURGERY  2008   nose, skin graft  . POLYPECTOMY  12/21/2017   Procedure: POLYPECTOMY;  Surgeon: [name], MD;  Location: [facility];  Service: Endoscopy;;  . RETINAL DETACHMENT SURGERY Right 2013  . TENOLYSIS Right 12/21/2013   Procedure: TENOLYSIS FLEXOR CARPI RADIALIS ,DEBRIDEMENT RIGHT JOINT WRIST,DEBRIDEMENT SCAPHOTRAPEZIAL TRAPEZOID, REPAIR OF EXTENSOR HOOD;  Surgeon: Daryll Brod, MD;  Location: [facility];  Service: Orthopedics;  Laterality: Right;  . V-TACH ABLATION  12/21/2014  . VIDEO BRONCHOSCOPY Bilateral 01/09/2016   Procedure: VIDEO BRONCHOSCOPY WITHOUT FLUORO;  Surgeon: Juanito Doom, MD;  Location: [facility];  Service: Cardiopulmonary;  Laterality: Bilateral;   Social History   Socioeconomic History  . Marital status: Married    Spouse name: Not on file  . Number of children: Not on file  . Years of education: Not on file  . Highest education level: Not on file  Occupational History  . Occupation: Retired  Scientific laboratory technician  . Financial resource strain: Not on file  . Food insecurity:    Worry: Not on file    Inability: Not on file  . Transportation needs:    Medical: Not on file    Non-medical: Not on file  Tobacco Use  . Smoking status: Former Smoker    Packs/day: 1.00    Years: 55.00    Pack years: 55.00    Types: Cigarettes    Last attempt to quit: 10/12/2016    Years since quitting: 1.5  . Smokeless tobacco: Never Used  . Tobacco comment: had quit, recently bought a pack  Substance and Sexual Activity  . Alcohol use: Yes    Alcohol/week: 0.0 standard drinks    Comment: 12/21/2014 "might have a couple glasses of wine/year"  . Drug use: No  . Sexual activity: Not Currently  Lifestyle  . Physical activity:    Days per week: Not on file    Minutes per session: Not on file  . Stress: Not on file  Relationships  . Social connections:    Talks on phone: Not on file    Gets together: Not on file    Attends religious service: Not  on file    Active member of club or organization: Not on file    Attends meetings of clubs or organizations: Not on file    Relationship status: Not on file  Other Topics Concern  . Not on file  Social History Narrative  . Not on file   Allergies  Allergen Reactions  . Sulfa Antibiotics Hives  . Sulfasalazine Hives   Family History  Problem Relation Age of Onset  . Heart attack Brother   . CAD Father   . Hypertension Father   . CAD Mother   . Hypertension Mother   . Hypertension Brother   . Stroke Neg Hx      Current Outpatient  Medications (Cardiovascular):  .  amiodarone (PACERONE) 200 MG tablet, Take 1 tablet (200 mg total) by mouth daily. [name]  atorvastatin (LIPITOR) 80 MG tablet, Take 1 tablet (80 mg total) by mouth daily. .  benazepril (LOTENSIN) 10 MG tablet, TAKE 1 TABLET BY MOUTH ONCE DAILY .  carvedilol (COREG) 3.125 MG tablet, TAKE 1 TABLET(3.125 MG) BY MOUTH TWICE DAILY .  Choline Fenofibrate (FENOFIBRIC ACID) 135 MG CPDR, TAKE 1 CAPSULE BY MOUTH DAILY .  furosemide (LASIX) 40 MG tablet, TAKE 1/2 TABLET(20 MG) BY MOUTH DAILY (Patient taking differently: Take 20 mg by mouth daily. ) .  mexiletine (MEXITIL) 200 MG capsule, TAKE 1 CAPSULE BY MOUTH THREE TIMES DAILY .  nitroGLYCERIN (NITROSTAT) 0.4 MG SL tablet, Place 1 tablet (0.4 mg total) under the tongue every 5 (five) minutes x 3 doses as needed for chest pain.  Current Outpatient Medications (Respiratory):  .  albuterol (PROVENTIL) (2.5 MG/3ML) 0.083% nebulizer solution, Take 3 mLs (2.5 mg total) by nebulization every 6 (six) hours as needed for wheezing or shortness of breath. .  budesonide-formoterol (SYMBICORT) 160-4.5 MCG/ACT inhaler, Inhale 2 puffs into the lungs 2 (two) times daily. (Patient taking differently: Inhale 2 puffs into the lungs daily. ) .  cetirizine (ZYRTEC) 10 MG tablet, Take 10 mg by mouth daily as needed for allergies. .  Ipratropium-Albuterol (COMBIVENT RESPIMAT) 20-100 MCG/ACT AERS respimat,  Inhale 1 puff into the lungs every 6 (six) hours. Shortness of breath or wheezing (Patient taking differently: Inhale 1 puff into the lungs every 6 (six) hours as needed for wheezing. ) .  Tiotropium Bromide Monohydrate (SPIRIVA RESPIMAT) 2.5 MCG/ACT AERS, Inhale 2 puffs into the lungs daily.   Current Outpatient Medications (Hematological):  .  cyanocobalamin (,VITAMIN B-12,) 1000 MCG/ML injection, Inject 1 mL (1,000 mcg total) into the muscle every 14 (fourteen) days. .  IRON PO, Take by mouth. Alveda Reasons 20 MG TABS tablet, TAKE 1 TABLET BY MOUTH EVERY DAY WITH SUPPER (Patient taking differently: Take 20 mg by mouth daily with supper. )  Current Outpatient Medications (Other):  [name]  Artificial Tear Solution (SOOTHE XP OP), Place 2 drops into both eyes daily as needed (dry eyes). .  baclofen 5 MG TABS, Take 5 mg by mouth 3 (three) times daily. [name]  buPROPion (WELLBUTRIN) 75 MG tablet, TAKE 1 TABLET(75 MG) BY MOUTH DAILY .  busPIRone (BUSPAR) 15 MG tablet, TAKE 1 TABLET BY MOUTH THREE TIMES A DAY .  Cholecalciferol (VITAMIN D3) 2000 units CHEW, Chew 4,000 Units by mouth daily.  [name]  escitalopram (LEXAPRO) 20 MG tablet, TAKE 1 TABLET(20 MG) BY MOUTH DAILY .  gabapentin (NEURONTIN) 300 MG capsule, Take 1 capsule (300 mg total) by mouth 2 (two) times daily. (Patient taking differently: Take 300 mg by mouth 3 (three) times daily. ) .  Magnesium Oxide 400 MG CAPS, Take 1 capsule (400 mg total) by mouth daily. [name]  nystatin (MYCOSTATIN) 100000 UNIT/ML suspension, Take 5 mLs (500,000 Units total) by mouth 2 (two) times daily. [name]  omeprazole (PRILOSEC) 20 MG capsule, Take 20 mg by mouth 2 (two) times daily. .  potassium chloride SA (K-DUR,KLOR-CON) 20 MEQ tablet, Take 1 tablet (20 mEq total) by mouth daily. (Patient taking differently: Take 20 mEq by mouth at bedtime. ) .  Respiratory Therapy Supplies (FLUTTER) DEVI, Use as directed .  Syringe/Needle, Disp, (SYRINGE 3CC/25GX5/8") 25G X 5/8" 3 ML MISC, 1 each by  Does not apply route every 14 (fourteen) days. .  tamsulosin (FLOMAX) 0.4  MG CAPS capsule, TAKE 1 CAPSULE BY MOUTH EVERY DAY AFTER SUPPER    Past medical history, social, surgical and family history all reviewed in electronic medical record.  No pertanent information unless stated regarding to the chief complaint.   Review of Systems:  No headache, visual changes, nausea, vomiting, diarrhea, constipation, dizziness, abdominal pain, skin rash, fevers, chills, night sweats, weight loss, swollen lymph nodes, body aches, joint swelling,  chest pain, shortness of breath, mood changes.  Positive muscle aches  Objective  Blood pressure 100/68, height 6' (1.829 m), weight 202 lb (91.6 kg).    General: No apparent distress alert and oriented x3 mood and affect normal, dressed appropriately.  HEENT: Pupils equal, extraocular movements intact  Respiratory: Patient's speak in full sentences and does not appear short of breath  Cardiovascular: Trace lower extremity edema, non tender, no erythema  Skin: Warm dry intact with no signs of infection or rash on extremities or on axial skeleton.  Multiple areas of senile purpura Abdomen: Soft nontender  Neuro: Cranial nerves II through XII are intact, neurovascularly intact in all extremities with 2+ DTRs and 2+ pulses.  Lymph: No lymphadenopathy of posterior or anterior cervical chain or axillae bilaterally.  Gait mild antalgic MSK:  tender with full range of motion and good stability and symmetric strength and tone of shoulders, elbows, wrist, hip, knee and ankles bilaterally.  Neck exam and severe loss of lordosis.  Minimal side bending either way.  Patient has very minimal extension.  Significant crepitus with range of motion he does have  Back exam shows in the thoracolumbar junction patient has multiple trigger points in the left side.  Still tightness noted in the paraspinal musculature lumbar spine.  Mild positive Corky Sox on the left side but negative  straight leg test.  After verbal consent patient was prepped with alcohol swabs and with a 25-gauge half inch needle injected in 4 distinct trigger points in the thoracolumbar juncture in the paraspinal musculature.  Total of 3 cc of 0.5% Marcaine and 1 cc of Kenalog 40 mg/mL used   Impression and Recommendations:     This case required medical decision making of moderate complexity. The above documentation has been reviewed and is accurate and complete Lyndal Pulley, DO       Note: This dictation was prepared with Dragon dictation along with smaller phrase technology. Any transcriptional errors that result from this process are unintentional.

## 2018-04-20 ENCOUNTER — Ambulatory Visit (INDEPENDENT_AMBULATORY_CARE_PROVIDER_SITE_OTHER): Payer: Medicare Other | Admitting: Family Medicine

## 2018-04-20 ENCOUNTER — Encounter: Payer: Self-pay | Admitting: Family Medicine

## 2018-04-20 DIAGNOSIS — I255 Ischemic cardiomyopathy: Secondary | ICD-10-CM

## 2018-04-20 DIAGNOSIS — M5459 Other low back pain: Secondary | ICD-10-CM | POA: Insufficient documentation

## 2018-04-20 DIAGNOSIS — M4802 Spinal stenosis, cervical region: Secondary | ICD-10-CM

## 2018-04-20 DIAGNOSIS — M545 Low back pain: Secondary | ICD-10-CM | POA: Diagnosis not present

## 2018-04-20 NOTE — Assessment & Plan Note (Signed)
Patient given injection today.  Tolerated the procedure well.  Discussed icing regimen and home exercises.  Discussed which activities to do which was to avoid.  Patient is to increase activity slowly over the course the next several days.  Follow-up again in 4 to 8 weeks

## 2018-04-20 NOTE — Assessment & Plan Note (Signed)
Patient is having no worsening.  Not a candidate for surgery or epidurals.  Continue the conservative therapy and physical therapy.  Follow-up again in 1 to 2 months

## 2018-04-24 NOTE — Progress Notes (Signed)
Cardiology Office Note:    Date:  04/25/2018   ID:  Hridhaan, Yohn October 15, 1940, MRN [id number]  PCP:  Hoyt Koch, MD  Cardiologist:  Sinclair Grooms, MD   Referring MD: Hoyt Koch, *   Chief Complaint  Patient presents with  . Coronary Artery Disease  . Congestive Heart Failure    History of Present Illness:    KEIL PICKERING is a 78 y.o. male with a hx of Ischemic cardiomyopathy with EF 35%, COPD, hypertension, recent low blood pressures, CAD with prior coronary bypass grafting, continued tobacco abuse, PAF, ventricular tachycardia controlled on amiodarone and Mexitil, and implantable cardiac defibrillator.  Feels well.  He has not had syncope.  He has not had an AICD discharge.  He has terrible bruising on his arms and legs.  He is on both aspirin 81 mg/day and Xarelto.  Denies orthopnea, PND, but does have recurring episodes of chest pain, rare.  These are unchanged over the years.  Occasionally he uses nitroglycerin.  Past Medical History:  Diagnosis Date  . AICD (automatic cardioverter/defibrillator) present   . Aneurysm ([place])    a. Aneurysmal infrarenal aorta up to 33 mm on CT 10/2014, recommended f/u due 10/2017  . Anginal pain ([place])   . Anxiety   . Basal cell carcinoma of nose    S/P MOHS  . Biliary acute pancreatitis   . CAD (coronary artery disease)    a. s/p MI in 1994 with PCI to LAD at that time b. cath 10/2012 demonstrated EF 30%, inferior akinesis with mild hypokinesis of all walls, patent LAD and RCA stents; ostial PDA with 80-90% obstruction with medical therapy recommended   . Chronic systolic CHF (congestive heart failure) (HCC)    EF 30 to 35 % as of 09/2014.   [name] CKD (chronic kidney disease), stage III ([place])   . Complication of anesthesia 10/2014   "had to have defibrillator w/ERCP"  . COPD (chronic obstructive pulmonary disease) ([place])    a. followed by pulmonary, COPD GOLD stage II  . Depression   . Diverticulosis of colon  07/2014   noted on CT  . GERD (gastroesophageal reflux disease)   . Hiatal hernia   . Hyperglycemia 10/2012.  [name] Hyperlipidemia   . Hypertension   . Myocardial infarction [facility]) 1994; 2011  . Pneumonia 1946; 2015  . Prostate enlargement 07/2014   observed on CT  . Tobacco abuse   . Ventricular tachycardia ([place])    a. 08/2009 s/p BSX E110 Teligen 100 AICD, ser#: [id number];  b. 08/2008 VT req ATP - detection reprogrammed from 160 to 150. c. EPS and VT ablation by Dr. Lovena Le 12/21/2014    Past Surgical History:  Procedure Laterality Date  . BIOPSY  12/21/2017   Procedure: BIOPSY;  Surgeon: [name], MD;  Location: [facility];  Service: Endoscopy;;  . CATARACT EXTRACTION W/ INTRAOCULAR LENS  IMPLANT, BILATERAL Bilateral ~ 2011  . COLONOSCOPY    . COLONOSCOPY WITH PROPOFOL N/A 12/21/2017   Procedure: COLONOSCOPY WITH PROPOFOL;  Surgeon: [name], MD;  Location: [facility];  Service: Endoscopy;  Laterality: N/A;  . ELECTROPHYSIOLOGIC STUDY N/A 12/21/2014   Procedure: V Tach Ablation;  Surgeon: [name], MD;  Location: [facility];  Service: Cardiovascular;  Laterality: N/A;  . ERCP N/A 11/16/2014   Procedure: ENDOSCOPIC RETROGRADE CHOLANGIOPANCREATOGRAPHY (ERCP);  Surgeon: [name], MD;  Location: [facility];  Service: Endoscopy;  Laterality: N/A;  .  ESOPHAGOGASTRODUODENOSCOPY (EGD) WITH PROPOFOL N/A 12/21/2017   Procedure: ESOPHAGOGASTRODUODENOSCOPY (EGD) WITH PROPOFOL;  Surgeon: [name], MD;  Location: [facility];  Service: Endoscopy;  Laterality: N/A;  . EYE SURGERY    . FOOT SURGERY Left 2005   "fixed bone that stuck out in my ankle area"  . HEMORRHOID BANDING    . IMPLANTABLE CARDIOVERTER DEFIBRILLATOR IMPLANT  09/06/09   BSX dual chamber ICD implanted in [place] for cardiac arrest 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  . INGUINAL HERNIA REPAIR Right ~ 1995  . LEFT HEART CATHETERIZATION WITH CORONARY ANGIOGRAM N/A 11/25/2012   demonstrated EF 30%, inferior akinesis  with mild hypokinesis of all walls, patent LAD and RCA stents; ostial PDA with 80-90% obstruction with medical therapy recommended  . MOHS SURGERY  2008   nose, skin graft  . POLYPECTOMY  12/21/2017   Procedure: POLYPECTOMY;  Surgeon: [name], MD;  Location: [facility];  Service: Endoscopy;;  . RETINAL DETACHMENT SURGERY Right 2013  . TENOLYSIS Right 12/21/2013   Procedure: TENOLYSIS FLEXOR CARPI RADIALIS ,DEBRIDEMENT RIGHT JOINT WRIST,DEBRIDEMENT SCAPHOTRAPEZIAL TRAPEZOID, REPAIR OF EXTENSOR HOOD;  Surgeon: Daryll Brod, MD;  Location: [facility];  Service: Orthopedics;  Laterality: Right;  . V-TACH ABLATION  12/21/2014  . VIDEO BRONCHOSCOPY Bilateral 01/09/2016   Procedure: VIDEO BRONCHOSCOPY WITHOUT FLUORO;  Surgeon: Juanito Doom, MD;  Location: [facility];  Service: Cardiopulmonary;  Laterality: Bilateral;    Current Medications: Current Meds  Medication Sig  . albuterol (PROVENTIL) (2.5 MG/3ML) 0.083% nebulizer solution Take 3 mLs (2.5 mg total) by nebulization every 6 (six) hours as needed for wheezing or shortness of breath.  [name] amiodarone (PACERONE) 200 MG tablet Take 1 tablet (200 mg total) by mouth daily.  . Artificial Tear Solution (SOOTHE XP OP) Place 2 drops into both eyes daily as needed (dry eyes).  [name] atorvastatin (LIPITOR) 80 MG tablet Take 1 tablet (80 mg total) by mouth daily.  . baclofen 5 MG TABS Take 5 mg by mouth 3 (three) times daily.  . benazepril (LOTENSIN) 10 MG tablet TAKE 1 TABLET BY MOUTH ONCE DAILY  . budesonide-formoterol (SYMBICORT) 160-4.5 MCG/ACT inhaler Inhale 2 puffs into the lungs 2 (two) times daily.  [name] buPROPion (WELLBUTRIN) 75 MG tablet TAKE 1 TABLET(75 MG) BY MOUTH DAILY  . busPIRone (BUSPAR) 15 MG tablet TAKE 1 TABLET BY MOUTH THREE TIMES A DAY  . carvedilol (COREG) 3.125 MG tablet TAKE 1 TABLET(3.125 MG) BY MOUTH TWICE DAILY  . cetirizine (ZYRTEC) 10 MG tablet Take 10 mg by mouth daily as needed for allergies.  .  Cholecalciferol (VITAMIN D3) 2000 units CHEW Chew 4,000 Units by mouth daily.   . Choline Fenofibrate (FENOFIBRIC ACID) 135 MG CPDR TAKE 1 CAPSULE BY MOUTH DAILY  . cyanocobalamin (,VITAMIN B-12,) 1000 MCG/ML injection Inject 1 mL (1,000 mcg total) into the muscle every 14 (fourteen) days.  [name] escitalopram (LEXAPRO) 20 MG tablet TAKE 1 TABLET(20 MG) BY MOUTH DAILY  . fluticasone (FLONASE) 50 MCG/ACT nasal spray INSTILL 2 SPRAYS INTO EACH NOSTRIL QD  . furosemide (LASIX) 40 MG tablet TAKE 1/2 TABLET(20 MG) BY MOUTH DAILY  . gabapentin (NEURONTIN) 300 MG capsule Take 1 capsule (300 mg total) by mouth 2 (two) times daily.  . Ipratropium-Albuterol (COMBIVENT RESPIMAT) 20-100 MCG/ACT AERS respimat Inhale 1 puff into the lungs every 6 (six) hours. Shortness of breath or wheezing  . IRON PO Take by mouth.  . Magnesium Oxide 400 MG CAPS Take 1 capsule (400 mg total) by  mouth daily.  [name] mexiletine (MEXITIL) 200 MG capsule TAKE 1 CAPSULE BY MOUTH THREE TIMES DAILY  . nitroGLYCERIN (NITROSTAT) 0.4 MG SL tablet Place 1 tablet (0.4 mg total) under the tongue every 5 (five) minutes x 3 doses as needed for chest pain.  [name] omeprazole (PRILOSEC) 20 MG capsule Take 20 mg by mouth daily.   . potassium chloride SA (K-DUR,KLOR-CON) 20 MEQ tablet Take 1 tablet (20 mEq total) by mouth daily.  [name] Respiratory Therapy Supplies (FLUTTER) DEVI Use as directed  . Syringe/Needle, Disp, (SYRINGE 3CC/25GX5/8") 25G X 5/8" 3 ML MISC 1 each by Does not apply route every 14 (fourteen) days.  . tamsulosin (FLOMAX) 0.4 MG CAPS capsule TAKE 1 CAPSULE BY MOUTH EVERY DAY AFTER SUPPER  . Tiotropium Bromide Monohydrate (SPIRIVA RESPIMAT) 2.5 MCG/ACT AERS Inhale 2 puffs into the lungs daily.  Alveda Reasons 20 MG TABS tablet TAKE 1 TABLET BY MOUTH EVERY DAY WITH SUPPER     Allergies:   Sulfa antibiotics and Sulfasalazine   Social History   Socioeconomic History  . Marital status: Married    Spouse name: Not on file  . Number of children:  Not on file  . Years of education: Not on file  . Highest education level: Not on file  Occupational History  . Occupation: Retired  Scientific laboratory technician  . Financial resource strain: Not on file  . Food insecurity:    Worry: Not on file    Inability: Not on file  . Transportation needs:    Medical: Not on file    Non-medical: Not on file  Tobacco Use  . Smoking status: Former Smoker    Packs/day: 1.00    Years: 55.00    Pack years: 55.00    Types: Cigarettes    Last attempt to quit: 10/12/2016    Years since quitting: 1.5  . Smokeless tobacco: Never Used  . Tobacco comment: had quit, recently bought a pack  Substance and Sexual Activity  . Alcohol use: Yes    Alcohol/week: 0.0 standard drinks    Comment: 12/21/2014 "might have a couple glasses of wine/year"  . Drug use: No  . Sexual activity: Not Currently  Lifestyle  . Physical activity:    Days per week: Not on file    Minutes per session: Not on file  . Stress: Not on file  Relationships  . Social connections:    Talks on phone: Not on file    Gets together: Not on file    Attends religious service: Not on file    Active member of club or organization: Not on file    Attends meetings of clubs or organizations: Not on file    Relationship status: Not on file  Other Topics Concern  . Not on file  Social History Narrative  . Not on file     Family History: The patient's family history includes CAD in his father and mother; Heart attack in his brother; Hypertension in his brother, father, and mother. There is no history of Stroke.  ROS:   Please see the history of present illness.    Back and lower extremity discomfort.  Depression, muscle pain, dizziness, easy bruising, anxiety, difficulty with balance, unexplained weight gain, and tobacco use.  All other systems reviewed and are negative.  EKGs/Labs/Other Studies Reviewed:    The following studies were reviewed today: No new functional or imaging data  EKG:  EKG  performed in September 2019 reveals right axis deviation, interventricular conduction delay, and  nonspecific ST-T wave abnormality.  Recent Labs: 09/21/2017: Magnesium 1.9; TSH 1.970 03/09/2018: ALT 13; BUN 13; Creatinine, Ser 1.08; Hemoglobin 13.0; Platelets 208.0; Potassium 4.1; Sodium 138  Recent Lipid Panel    Component Value Date/Time   CHOL 157 09/21/2017 1546   TRIG 79 09/21/2017 1546   HDL 46 09/21/2017 1546   CHOLHDL 3.4 09/21/2017 1546   CHOLHDL 3 12/29/2016 1618   VLDL 16.6 12/29/2016 1618   LDLCALC 95 09/21/2017 1546    Physical Exam:    VS:  BP 108/68   Pulse 69   Ht 6' (1.829 m)   Wt 197 lb 6.4 oz (89.5 kg)   SpO2 95%   BMI 26.77 kg/m     Wt Readings from Last 3 Encounters:  04/25/18 197 lb 6.4 oz (89.5 kg)  04/20/18 202 lb (91.6 kg)  03/09/18 198 lb (89.8 kg)     GEN: Smells of stale cigarette smoke.. No acute distress HEENT: Normal NECK: No JVD. LYMPHATICS: No lymphadenopathy CARDIAC: RRR.  No murmur, no gallop, no edema VASCULAR: 2+ bilateral radial pulses, no carotid bruits RESPIRATORY:  Clear to auscultation without rales, wheezing or rhonchi  ABDOMEN: Soft, non-tender, non-distended, No pulsatile mass, MUSCULOSKELETAL: No deformity  SKIN: Warm and dry NEUROLOGIC:  Alert and oriented x 3 PSYCHIATRIC:  Normal affect   ASSESSMENT:    1. Coronary artery disease involving native coronary artery of native heart without angina pectoris   2. Essential hypertension   3. Ventricular tachycardia (HCC)   4. Cardiomyopathy, ischemic   5. ICD (implantable cardioverter-defibrillator) in place   6. Chronic systolic heart failure (HCC)   7. Paroxysmal atrial fibrillation ([place])   8. Other hyperlipidemia   9. Long term current use of amiodarone    PLAN:    In order of problems listed above:  1. Stable with reference to CAD.  Smoking cessation discussed. 2. Blood pressure control is adequate. 3. Amiodarone has prevented recurrent ventricular  tachycardia. 4. Heart failure therapy includes Lotensin and carvedilol, add low-dose. 5. AICD is followed by Dr. Lovena Le. 6. He is on guideline directed therapy and volume status is stable. 7. Xarelto is being used for stroke prophylaxis.  He is also on aspirin.  Decrease aspirin to Monday Wednesday and Friday, 81 mg each dose. 8. LDL target should be less than 70. 9. Amiodarone therapy should be monitored twice a year with TSH and liver panel.  He needs Elisa once your chest x-ray.  TSH will be obtained today and a TSH and liver panel will be repeated and 6 months when he sees a team member.  I will see him back in 1 year.  Smoking cessation encouraged.  Stop date set for February 1.   Medication Adjustments/Labs and Tests Ordered: Current medicines are reviewed at length with the patient today.  Concerns regarding medicines are outlined above.  Orders Placed This Encounter  Procedures  . TSH  . Hepatic function panel   No orders of the defined types were placed in this encounter.   There are no Patient Instructions on file for this visit.   Signed, Sinclair Grooms, MD  04/25/2018 3:45 PM    [place]

## 2018-04-25 ENCOUNTER — Telehealth: Payer: Self-pay | Admitting: Pulmonary Disease

## 2018-04-25 ENCOUNTER — Ambulatory Visit (INDEPENDENT_AMBULATORY_CARE_PROVIDER_SITE_OTHER): Payer: Medicare Other | Admitting: Interventional Cardiology

## 2018-04-25 ENCOUNTER — Encounter: Payer: Self-pay | Admitting: Interventional Cardiology

## 2018-04-25 VITALS — BP 108/68 | HR 69 | Ht 72.0 in | Wt 197.4 lb

## 2018-04-25 DIAGNOSIS — I2510 Atherosclerotic heart disease of native coronary artery without angina pectoris: Secondary | ICD-10-CM | POA: Diagnosis not present

## 2018-04-25 DIAGNOSIS — I10 Essential (primary) hypertension: Secondary | ICD-10-CM | POA: Diagnosis not present

## 2018-04-25 DIAGNOSIS — I472 Ventricular tachycardia, unspecified: Secondary | ICD-10-CM

## 2018-04-25 DIAGNOSIS — I5022 Chronic systolic (congestive) heart failure: Secondary | ICD-10-CM

## 2018-04-25 DIAGNOSIS — Z79899 Other long term (current) drug therapy: Secondary | ICD-10-CM | POA: Diagnosis not present

## 2018-04-25 DIAGNOSIS — E7849 Other hyperlipidemia: Secondary | ICD-10-CM

## 2018-04-25 DIAGNOSIS — Z95810 Presence of automatic (implantable) cardiac defibrillator: Secondary | ICD-10-CM | POA: Diagnosis not present

## 2018-04-25 DIAGNOSIS — I480 Paroxysmal atrial fibrillation: Secondary | ICD-10-CM

## 2018-04-25 DIAGNOSIS — I255 Ischemic cardiomyopathy: Secondary | ICD-10-CM

## 2018-04-25 MED ORDER — ASPIRIN EC 81 MG PO TBEC: 81.0000 mg | DELAYED_RELEASE_TABLET | ORAL | 3 refills | Status: DC

## 2018-04-25 MED ORDER — TIOTROPIUM BROMIDE MONOHYDRATE 2.5 MCG/ACT IN AERS: 2.0000 | INHALATION_SPRAY | Freq: Every day | RESPIRATORY_TRACT | 5 refills | Status: DC

## 2018-04-25 MED ORDER — BUDESONIDE-FORMOTEROL FUMARATE 160-4.5 MCG/ACT IN AERO: 2.0000 | INHALATION_SPRAY | Freq: Two times a day (BID) | RESPIRATORY_TRACT | 5 refills | Status: DC

## 2018-04-25 NOTE — Telephone Encounter (Signed)
Patient last office visit 12.2.19 w/ BQ, recs to follow up in 6 months. Called spoke with patient spouse Steven Guzman (dpr on file) who verified the medications needing refills and the requested pharmacy. Refills sent. Nothing further needed, will sign off.

## 2018-04-25 NOTE — Patient Instructions (Signed)
Medication Instructions:  DECREASE: Aspirin 81 tablet to taking it on Monday, Wednesdays, and Fridays  If you need a refill on your cardiac medications before your next appointment, please call your pharmacy.   Lab work: TODAY: TSH  Your physician recommends that you return for lab work in: 6 months for TSH, LFTS  If you have labs (blood work) drawn today and your tests are completely normal, you will receive your results only by: [name] MyChart Message (if you have MyChart) OR . A paper copy in the mail If you have any lab test that is abnormal or we need to change your treatment, we will call you to review the results.  Testing/Procedures: None ordered  Follow-Up: Your physician wants you to follow-up in: 6 months with APP on Dr. Thompson Caul team. Dennis Bast will receive a reminder letter in the mail two months in advance. If you don't receive a letter, please call our office to schedule the follow-up appointment.   At [facility], you and your health needs are our priority.  As part of our continuing mission to provide you with exceptional heart care, we have created designated Provider Care Teams.  These Care Teams include your primary Cardiologist (physician) and Advanced Practice Providers (APPs -  Physician Assistants and Nurse Practitioners) who all work together to provide you with the care you need, when you need it. . You will need a follow up appointment in 1 year.  Please call our office 2 months in advance to schedule this appointment.  You may see Sinclair Grooms, MD or one of the following Advanced Practice Providers on your designated Care Team:   . Truitt Merle, NP . Cecilie Kicks, NP . Kathyrn Drown, NP   Any Other Special Instructions Will Be Listed Below (If Applicable).

## 2018-04-26 LAB — TSH: TSH: 1.45 u[IU]/mL (ref 0.450–4.500)

## 2018-04-28 DIAGNOSIS — S20211A Contusion of right front wall of thorax, initial encounter: Secondary | ICD-10-CM | POA: Diagnosis not present

## 2018-04-28 DIAGNOSIS — S80211A Abrasion, right knee, initial encounter: Secondary | ICD-10-CM | POA: Diagnosis not present

## 2018-04-28 DIAGNOSIS — R079 Chest pain, unspecified: Secondary | ICD-10-CM | POA: Diagnosis not present

## 2018-04-28 DIAGNOSIS — S41111A Laceration without foreign body of right upper arm, initial encounter: Secondary | ICD-10-CM | POA: Diagnosis not present

## 2018-04-28 DIAGNOSIS — W07XXXA Fall from chair, initial encounter: Secondary | ICD-10-CM | POA: Diagnosis not present

## 2018-04-28 DIAGNOSIS — Y998 Other external cause status: Secondary | ICD-10-CM | POA: Diagnosis not present

## 2018-05-03 ENCOUNTER — Inpatient Hospital Stay: Payer: Medicare Other | Admitting: Internal Medicine

## 2018-05-05 ENCOUNTER — Inpatient Hospital Stay: Payer: Medicare Other | Admitting: Internal Medicine

## 2018-05-09 ENCOUNTER — Encounter: Payer: Self-pay | Admitting: Nurse Practitioner

## 2018-05-09 ENCOUNTER — Ambulatory Visit (INDEPENDENT_AMBULATORY_CARE_PROVIDER_SITE_OTHER): Payer: Medicare Other | Admitting: Nurse Practitioner

## 2018-05-09 VITALS — BP 116/78 | HR 74 | Ht 71.0 in | Wt 195.0 lb

## 2018-05-09 DIAGNOSIS — J069 Acute upper respiratory infection, unspecified: Secondary | ICD-10-CM | POA: Diagnosis not present

## 2018-05-09 NOTE — Progress Notes (Signed)
@Patient  ID: Steven Guzman, male    DOB: March 21, 1941, 78 y.o.   MRN: [id number]  Chief Complaint  Patient presents with  . Acute Visit    States he is having wheezing, and yellow mucous for 5 days. States over the weekend he has had to use neb 3 times per day.     Referring provider: Hoyt Koch, *  HPI 78 year old male former smoker (quit 2018) with COPD GOLD C followed by Dr. Lake Bells  Tests: PFT: 10/2013 PFT> ratio 61%, FEV1 2.53L (77% pred, 15% change), TLC 8.65 L (117% pred), RV 4.25L (161% pred), DLCO 25.39 (73% pred)  Procedure: Bronchoscopy 01/09/2016 normal airways  Imaging: July 2017 CT chest-  mild to moderate centrilobular emphysema, no obvious pulmonary parenchymal mass. February 2018 chest x-ray images independently reviewed showing emphysema, pacemaker in place, mild cardiomegaly, no other pulmonary parenchymal abnormalities. 11/2017 CT chest/abdomen pelvis: 3.4 cm AAA, no malignant appearing lesions in his chest, images personally reviewed. Mild centilobular emphysema noted, images independently reviewed.   OV 05/09/18 - Acute wheezing, sinus congestion, cough Patient presents today for an acute visit.  He states for the past week he has had wheezing, sinus congestion, and cough.  He states that his cough has been productive of yellow sputum.  Nasal drainage is yellow.  States that he has been feeling fatigued.  Symptoms are progressively worsening.  He has been compliant with Symbicort and Spiriva.  He states Proventil neb treatments as needed has had to use these more frequently over the past 3 days.  Denies any recent fever.  Denies any chest pain or edema.   Allergies  Allergen Reactions  . Sulfa Antibiotics Hives  . Sulfasalazine Hives    Immunization History  Administered Date(s) Administered  . Influenza, High Dose Seasonal PF 12/17/2012, 01/06/2016, 12/21/2016  . Influenza,inj,Quad PF,6+ Mos 12/25/2013, 12/22/2014  . Pneumococcal  Conjugate-13 05/11/2014  . Pneumococcal-Unspecified 10/17/2010  . Tdap 05/11/2014  . Zoster 03/30/2008    Past Medical History:  Diagnosis Date  . AICD (automatic cardioverter/defibrillator) present   . Aneurysm ([place])    a. Aneurysmal infrarenal aorta up to 33 mm on CT 10/2014, recommended f/u due 10/2017  . Anginal pain ([place])   . Anxiety   . Basal cell carcinoma of nose    S/P MOHS  . Biliary acute pancreatitis   . CAD (coronary artery disease)    a. s/p MI in 1994 with PCI to LAD at that time b. cath 10/2012 demonstrated EF 30%, inferior akinesis with mild hypokinesis of all walls, patent LAD and RCA stents; ostial PDA with 80-90% obstruction with medical therapy recommended   . Chronic systolic CHF (congestive heart failure) (HCC)    EF 30 to 35 % as of 09/2014.   [name] CKD (chronic kidney disease), stage III ([place])   . Complication of anesthesia 10/2014   "had to have defibrillator w/ERCP"  . COPD (chronic obstructive pulmonary disease) ([place])    a. followed by pulmonary, COPD GOLD stage II  . Depression   . Diverticulosis of colon 07/2014   noted on CT  . GERD (gastroesophageal reflux disease)   . Hiatal hernia   . Hyperglycemia 10/2012.  [name] Hyperlipidemia   . Hypertension   . Myocardial infarction [facility]) 1994; 2011  . Pneumonia 1946; 2015  . Prostate enlargement 07/2014   observed on CT  . Tobacco abuse   . Ventricular tachycardia ([place])    a. 08/2009 s/p BSX E110 Teligen 100 AICD, ser#: [id number];  b. 08/2008 VT req ATP - detection reprogrammed from 160 to 150. c. EPS and VT ablation by Dr. Lovena Le 12/21/2014    Tobacco History: Social History   Tobacco Use  Smoking Status Former Smoker  . Packs/day: 1.00  . Years: 55.00  . Pack years: 55.00  . Types: Cigarettes  . Last attempt to quit: 10/12/2016  . Years since quitting: 1.5  Smokeless Tobacco Never Used  Tobacco Comment   had quit, recently bought a pack   Counseling given: Yes Comment: had quit, recently bought a  pack   Outpatient Encounter Medications as of 05/09/2018  Medication Sig  . albuterol (PROVENTIL) (2.5 MG/3ML) 0.083% nebulizer solution Take 3 mLs (2.5 mg total) by nebulization every 6 (six) hours as needed for wheezing or shortness of breath.  [name] amiodarone (PACERONE) 200 MG tablet Take 1 tablet (200 mg total) by mouth daily.  . Artificial Tear Solution (SOOTHE XP OP) Place 2 drops into both eyes daily as needed (dry eyes).  [name] aspirin EC 81 MG tablet Take 1 tablet (81 mg total) by mouth every Monday, Wednesday, and Friday.  [name] atorvastatin (LIPITOR) 80 MG tablet Take 1 tablet (80 mg total) by mouth daily.  . baclofen 5 MG TABS Take 5 mg by mouth 3 (three) times daily.  . benazepril (LOTENSIN) 10 MG tablet TAKE 1 TABLET BY MOUTH ONCE DAILY  . budesonide-formoterol (SYMBICORT) 160-4.5 MCG/ACT inhaler Inhale 2 puffs into the lungs 2 (two) times daily.  [name] buPROPion (WELLBUTRIN) 75 MG tablet TAKE 1 TABLET(75 MG) BY MOUTH DAILY  . busPIRone (BUSPAR) 15 MG tablet TAKE 1 TABLET BY MOUTH THREE TIMES A DAY  . carvedilol (COREG) 3.125 MG tablet TAKE 1 TABLET(3.125 MG) BY MOUTH TWICE DAILY  . cetirizine (ZYRTEC) 10 MG tablet Take 10 mg by mouth daily as needed for allergies.  . Cholecalciferol (VITAMIN D3) 2000 units CHEW Chew 4,000 Units by mouth daily.   . Choline Fenofibrate (FENOFIBRIC ACID) 135 MG CPDR TAKE 1 CAPSULE BY MOUTH DAILY  . cyanocobalamin (,VITAMIN B-12,) 1000 MCG/ML injection Inject 1 mL (1,000 mcg total) into the muscle every 14 (fourteen) days.  [name] escitalopram (LEXAPRO) 20 MG tablet TAKE 1 TABLET(20 MG) BY MOUTH DAILY  . fluticasone (FLONASE) 50 MCG/ACT nasal spray INSTILL 2 SPRAYS INTO EACH NOSTRIL QD  . furosemide (LASIX) 40 MG tablet TAKE 1/2 TABLET(20 MG) BY MOUTH DAILY  . gabapentin (NEURONTIN) 300 MG capsule Take 1 capsule (300 mg total) by mouth 2 (two) times daily.  . Ipratropium-Albuterol (COMBIVENT RESPIMAT) 20-100 MCG/ACT AERS respimat Inhale 1 puff into the lungs every 6  (six) hours. Shortness of breath or wheezing  . IRON PO Take by mouth.  . Magnesium Oxide 400 MG CAPS Take 1 capsule (400 mg total) by mouth daily.  [name] mexiletine (MEXITIL) 200 MG capsule TAKE 1 CAPSULE BY MOUTH THREE TIMES DAILY  . Multiple Vitamins-Minerals (CENTRUM ADULTS PO) Take by mouth daily.  . nitroGLYCERIN (NITROSTAT) 0.4 MG SL tablet Place 1 tablet (0.4 mg total) under the tongue every 5 (five) minutes x 3 doses as needed for chest pain.  . Omega-3 Fatty Acids (FISH OIL) 1000 MG CAPS Take 1,000 mg by mouth daily.  [name] omeprazole (PRILOSEC) 20 MG capsule Take 20 mg by mouth daily.   . potassium chloride SA (K-DUR,KLOR-CON) 20 MEQ tablet Take 1 tablet (20 mEq total) by mouth daily.  [name] Respiratory Therapy Supplies (FLUTTER) DEVI Use as directed  . Syringe/Needle, Disp, (SYRINGE 3CC/25GX5/8") 25G X 5/8" 3 ML  MISC 1 each by Does not apply route every 14 (fourteen) days.  . tamsulosin (FLOMAX) 0.4 MG CAPS capsule TAKE 1 CAPSULE BY MOUTH EVERY DAY AFTER SUPPER  . Tiotropium Bromide Monohydrate (SPIRIVA RESPIMAT) 2.5 MCG/ACT AERS Inhale 2 puffs into the lungs daily.  Alveda Reasons 20 MG TABS tablet TAKE 1 TABLET BY MOUTH EVERY DAY WITH SUPPER   No facility-administered encounter medications on file as of 05/09/2018.      Review of Systems  Review of Systems  Constitutional: Negative.  Negative for chills and fever.  HENT: Positive for congestion, postnasal drip, sinus pressure and sinus pain.   Respiratory: Positive for cough, shortness of breath and wheezing.   Cardiovascular: Negative.  Negative for chest pain, palpitations and leg swelling.  Gastrointestinal: Negative.   Allergic/Immunologic: Negative.   Neurological: Negative.   Psychiatric/Behavioral: Negative.        Physical Exam  BP 116/78   Pulse 74   Ht 5\' 11"  (1.803 m)   Wt 195 lb (88.5 kg)   SpO2 96%   BMI 27.20 kg/m   Wt Readings from Last 5 Encounters:  05/09/18 195 lb (88.5 kg)  04/25/18 197 lb 6.4 oz (89.5 kg)   04/20/18 202 lb (91.6 kg)  03/09/18 198 lb (89.8 kg)  02/28/18 198 lb 6.4 oz (90 kg)     Physical Exam Vitals signs and nursing note reviewed.  Constitutional:      General: He is not in acute distress.    Appearance: He is well-developed.  Cardiovascular:     Rate and Rhythm: Normal rate and regular rhythm.  Pulmonary:     Effort: Pulmonary effort is normal. No respiratory distress.     Breath sounds: Normal breath sounds. No wheezing or rhonchi.  Musculoskeletal:        General: No swelling.  Skin:    General: Skin is warm and dry.  Neurological:     Mental Status: He is alert and oriented to person, place, and time.        Assessment & Plan:   URI with cough and congestion Patient complains today of sinus congestion with cough that is productive of yellow sputum and fatigue has been going on for the past week.  Denies any fever at this time.  We will treat for URI.  Will have patient return for close follow-up.  Patient Instructions  Will order doxycycline Will order prednisone taper May take mucinex twice daily continue Symbicort and Spiriva May continue albuterol nebs as needed Follow up with me in 1 week or sooner if needed       Fenton Foy, NP 05/11/2018

## 2018-05-09 NOTE — Patient Instructions (Addendum)
Will order doxycycline Will order prednisone taper May take mucinex twice daily continue Symbicort and Spiriva May continue albuterol nebs as needed Follow up with me in 1 week or sooner if needed

## 2018-05-10 ENCOUNTER — Telehealth: Payer: Self-pay | Admitting: Nurse Practitioner

## 2018-05-10 MED ORDER — DOXYCYCLINE HYCLATE 100 MG PO TABS: 100.0000 mg | ORAL_TABLET | Freq: Two times a day (BID) | ORAL | 0 refills | Status: DC

## 2018-05-10 MED ORDER — PREDNISONE 10 MG PO TABS: ORAL_TABLET | ORAL | 0 refills | Status: DC

## 2018-05-10 NOTE — Telephone Encounter (Signed)
Yes  Doxycyline 100mg  Twice daily  For 7 days #14 no refills  Prednisone 10mg  4 tabs for 2 days, then 3 tabs for 2 days, 2 tabs for 2 days, then 1 tab for 2 days, then stop #20 , no refills

## 2018-05-10 NOTE — Telephone Encounter (Signed)
Patient checking on meds to be called into pharmacy.  Patient phone number is [phone].

## 2018-05-10 NOTE — Telephone Encounter (Signed)
Tp can you please advise on dosage  Since [place] I gone for the day and she did not specify in the Avs ?  For Prednisone and Doxycycline.

## 2018-05-10 NOTE — Telephone Encounter (Signed)
Called the patient and left a detailed message that this has been sent in nothing further needed at this time.

## 2018-05-11 ENCOUNTER — Encounter: Payer: Self-pay | Admitting: Nurse Practitioner

## 2018-05-11 ENCOUNTER — Other Ambulatory Visit: Payer: Self-pay | Admitting: Internal Medicine

## 2018-05-11 DIAGNOSIS — J069 Acute upper respiratory infection, unspecified: Secondary | ICD-10-CM | POA: Insufficient documentation

## 2018-05-11 NOTE — Assessment & Plan Note (Addendum)
Patient complains today of sinus congestion with cough that is productive of yellow sputum and fatigue has been going on for the past week.  Denies any fever at this time.  We will treat for URI.  Will have patient return for close follow-up.  Patient Instructions  Will order doxycycline Will order prednisone taper May take mucinex twice daily continue Symbicort and Spiriva May continue albuterol nebs as needed Follow up with me in 1 week or sooner if needed

## 2018-05-16 ENCOUNTER — Encounter: Payer: Self-pay | Admitting: Nurse Practitioner

## 2018-05-16 ENCOUNTER — Ambulatory Visit (INDEPENDENT_AMBULATORY_CARE_PROVIDER_SITE_OTHER): Payer: Medicare Other | Admitting: Nurse Practitioner

## 2018-05-16 DIAGNOSIS — J069 Acute upper respiratory infection, unspecified: Secondary | ICD-10-CM

## 2018-05-16 NOTE — Progress Notes (Signed)
@Patient  ID: Steven Guzman, male    DOB: 09-Aug-1940, 78 y.o.   MRN: [id number]  Chief Complaint  Patient presents with  . Follow-up    Cough is productive. Still taking the prednisone and doxycycline.    Referring provider: Hoyt Koch, *  HPI   78 year old male former smoker (quit 2018) with COPD GOLD C followed by Dr. Lake Bells  Tests: PFT: 10/2013 PFT> ratio 61%, FEV1 2.53L (77% pred, 15% change), TLC 8.65 L (117% pred), RV 4.25L (161% pred), DLCO 25.39 (73% pred)  Procedure: Bronchoscopy 01/09/2016 normal airways  Imaging: July 2017 CT chest-  mild to moderate centrilobular emphysema, no obvious pulmonary parenchymal mass. February 2018 chest x-ray images independently reviewed showing emphysema, pacemaker in place, mild cardiomegaly, no other pulmonary parenchymal abnormalities. 11/2017 CT chest/abdomen pelvis: 3.4 cm AAA, no malignant appearing lesions in his chest, images personally reviewed. Mild centilobular emphysema noted, images independently reviewed.  OV 05/16/18 - Follow up Patient presents today for a follow-up.  He was seen by me on 05/09/2018 for URI.  He was started on doxycycline and prednisone taper.  He states that he is doing much better today.  He still complains of some ongoing cough that is productive of yellow sputum.  He still complains of some postnasal drip.  He states that for the last 3 days he is only had to use his Proventil neb treatment once.  Denies any recent fever.  Denies any chest pain, shortness of breath, or edema.  Allergies  Allergen Reactions  . Sulfa Antibiotics Hives  . Sulfasalazine Hives    Immunization History  Administered Date(s) Administered  . Influenza, High Dose Seasonal PF 12/17/2012, 01/06/2016, 12/21/2016  . Influenza,inj,Quad PF,6+ Mos 12/25/2013, 12/22/2014  . Pneumococcal Conjugate-13 05/11/2014  . Pneumococcal-Unspecified 10/17/2010  . Tdap 05/11/2014  . Zoster 03/30/2008    Past Medical  History:  Diagnosis Date  . AICD (automatic cardioverter/defibrillator) present   . Aneurysm ([place])    a. Aneurysmal infrarenal aorta up to 33 mm on CT 10/2014, recommended f/u due 10/2017  . Anginal pain ([place])   . Anxiety   . Basal cell carcinoma of nose    S/P MOHS  . Biliary acute pancreatitis   . CAD (coronary artery disease)    a. s/p MI in 1994 with PCI to LAD at that time b. cath 10/2012 demonstrated EF 30%, inferior akinesis with mild hypokinesis of all walls, patent LAD and RCA stents; ostial PDA with 80-90% obstruction with medical therapy recommended   . Chronic systolic CHF (congestive heart failure) ([facility])    EF 30 to 35 % as of 09/2014.   [name] CKD (chronic kidney disease), stage III ([place])   . Complication of anesthesia 10/2014   "had to have defibrillator w/ERCP"  . COPD (chronic obstructive pulmonary disease) ([place])    a. followed by pulmonary, COPD GOLD stage II  . Depression   . Diverticulosis of colon 07/2014   noted on CT  . GERD (gastroesophageal reflux disease)   . Hiatal hernia   . Hyperglycemia 10/2012.  [name] Hyperlipidemia   . Hypertension   . Myocardial infarction [facility]) 1994; 2011  . Pneumonia 1946; 2015  . Prostate enlargement 07/2014   observed on CT  . Tobacco abuse   . Ventricular tachycardia ([place])    a. 08/2009 s/p BSX E110 Teligen 100 AICD, ser#: [id number];  b. 08/2008 VT req ATP - detection reprogrammed from 160 to 150. c. EPS and VT ablation by Dr. Lovena Le 12/21/2014  Tobacco History: Social History   Tobacco Use  Smoking Status Former Smoker  . Packs/day: 1.00  . Years: 55.00  . Pack years: 55.00  . Types: Cigarettes  . Last attempt to quit: 10/12/2016  . Years since quitting: 1.5  Smokeless Tobacco Never Used  Tobacco Comment   had quit, recently bought a pack   Counseling given: Yes Comment: had quit, recently bought a pack   Outpatient Encounter Medications as of 05/16/2018  Medication Sig  . albuterol (PROVENTIL) (2.5 MG/3ML) 0.083% nebulizer  solution Take 3 mLs (2.5 mg total) by nebulization every 6 (six) hours as needed for wheezing or shortness of breath.  [name] amiodarone (PACERONE) 200 MG tablet Take 1 tablet (200 mg total) by mouth daily.  . Artificial Tear Solution (SOOTHE XP OP) Place 2 drops into both eyes daily as needed (dry eyes).  [name] aspirin EC 81 MG tablet Take 1 tablet (81 mg total) by mouth every Monday, Wednesday, and Friday.  [name] atorvastatin (LIPITOR) 80 MG tablet Take 1 tablet (80 mg total) by mouth daily.  . baclofen 5 MG TABS Take 5 mg by mouth 3 (three) times daily.  . benazepril (LOTENSIN) 10 MG tablet TAKE 1 TABLET BY MOUTH ONCE DAILY  . budesonide-formoterol (SYMBICORT) 160-4.5 MCG/ACT inhaler Inhale 2 puffs into the lungs 2 (two) times daily.  [name] buPROPion (WELLBUTRIN) 75 MG tablet TAKE 1 TABLET(75 MG) BY MOUTH DAILY  . busPIRone (BUSPAR) 15 MG tablet TAKE 1 TABLET BY MOUTH THREE TIMES A DAY  . carvedilol (COREG) 3.125 MG tablet TAKE 1 TABLET(3.125 MG) BY MOUTH TWICE DAILY  . cetirizine (ZYRTEC) 10 MG tablet Take 10 mg by mouth daily as needed for allergies.  . Cholecalciferol (VITAMIN D3) 2000 units CHEW Chew 4,000 Units by mouth daily.   . Choline Fenofibrate (FENOFIBRIC ACID) 135 MG CPDR TAKE 1 CAPSULE BY MOUTH DAILY  . cyanocobalamin (,VITAMIN B-12,) 1000 MCG/ML injection Inject 1 mL (1,000 mcg total) into the muscle every 14 (fourteen) days.  [name] doxycycline (VIBRA-TABS) 100 MG tablet Take 1 tablet (100 mg total) by mouth 2 (two) times daily.  [name] escitalopram (LEXAPRO) 20 MG tablet TAKE 1 TABLET(20 MG) BY MOUTH DAILY  . fluticasone (FLONASE) 50 MCG/ACT nasal spray INSTILL 2 SPRAYS INTO EACH NOSTRIL QD  . furosemide (LASIX) 40 MG tablet TAKE 1/2 TABLET(20 MG) BY MOUTH DAILY  . Ipratropium-Albuterol (COMBIVENT RESPIMAT) 20-100 MCG/ACT AERS respimat Inhale 1 puff into the lungs every 6 (six) hours. Shortness of breath or wheezing  . IRON PO Take by mouth.  . Magnesium Oxide 400 MG CAPS Take 1 capsule (400 mg total)  by mouth daily.  [name] mexiletine (MEXITIL) 200 MG capsule TAKE 1 CAPSULE BY MOUTH THREE TIMES DAILY  . Multiple Vitamins-Minerals (CENTRUM ADULTS PO) Take by mouth daily.  . nitroGLYCERIN (NITROSTAT) 0.4 MG SL tablet Place 1 tablet (0.4 mg total) under the tongue every 5 (five) minutes x 3 doses as needed for chest pain.  . Omega-3 Fatty Acids (FISH OIL) 1000 MG CAPS Take 1,000 mg by mouth daily.  [name] omeprazole (PRILOSEC) 20 MG capsule Take 20 mg by mouth daily.   . potassium chloride SA (K-DUR,KLOR-CON) 20 MEQ tablet Take 1 tablet (20 mEq total) by mouth daily.  . predniSONE (DELTASONE) 10 MG tablet 4 tabs for 2 days, then 3 tabs for 2 days, 2 tabs for 2 days, then 1 tab for 2 days, then stop  . Respiratory Therapy Supplies (FLUTTER) DEVI Use as directed  . Syringe/Needle, Disp, (  SYRINGE 3CC/25GX5/8") 25G X 5/8" 3 ML MISC 1 each by Does not apply route every 14 (fourteen) days.  . tamsulosin (FLOMAX) 0.4 MG CAPS capsule TAKE 1 CAPSULE BY MOUTH EVERY DAY AFTER SUPPER  . Tiotropium Bromide Monohydrate (SPIRIVA RESPIMAT) 2.5 MCG/ACT AERS Inhale 2 puffs into the lungs daily.  Alveda Reasons 20 MG TABS tablet TAKE 1 TABLET BY MOUTH EVERY DAY WITH SUPPER  . MAGNESIUM-OXIDE 400 (241.3 Mg) MG tablet Take 1 tablet by mouth daily.  . [DISCONTINUED] gabapentin (NEURONTIN) 300 MG capsule Take 1 capsule (300 mg total) by mouth 2 (two) times daily. (Patient not taking: Reported on 05/16/2018)   No facility-administered encounter medications on file as of 05/16/2018.      Review of Systems  Review of Systems  Constitutional: Negative.  Negative for chills and fever.  HENT: Positive for postnasal drip.   Respiratory: Positive for cough. Negative for shortness of breath and wheezing.   Cardiovascular: Negative.  Negative for chest pain, palpitations and leg swelling.  Gastrointestinal: Negative.   Allergic/Immunologic: Negative.   Neurological: Negative.   Psychiatric/Behavioral: Negative.        Physical  Exam  BP 128/72 (BP Location: Left Arm, Patient Position: Sitting, Cuff Size: Normal)   Pulse 78   Ht 5\' 11"  (1.803 m)   Wt 194 lb (88 kg)   SpO2 93%   BMI 27.06 kg/m   Wt Readings from Last 5 Encounters:  05/16/18 194 lb (88 kg)  05/09/18 195 lb (88.5 kg)  04/25/18 197 lb 6.4 oz (89.5 kg)  04/20/18 202 lb (91.6 kg)  03/09/18 198 lb (89.8 kg)     Physical Exam Vitals signs and nursing note reviewed.  Constitutional:      General: He is not in acute distress.    Appearance: He is well-developed.  Cardiovascular:     Rate and Rhythm: Normal rate and regular rhythm.  Pulmonary:     Effort: Pulmonary effort is normal. No respiratory distress.     Breath sounds: Normal breath sounds. No wheezing or rhonchi.  Musculoskeletal:        General: No swelling.  Skin:    General: Skin is warm and dry.  Neurological:     Mental Status: He is alert and oriented to person, place, and time.        Assessment & Plan:   URI with cough and congestion Patient is much improved today.  He has been compliant with doxycycline and prednisone taper.  Still has a few more days on each.  Denies any fever.  Patient Instructions  Please complete entire course of doxycyline and prednisone Continue symbicort Continue spiriva Will refill albuterol neb treatments Will give samples of mucinex Follow up with Dr. Lake Bells as scheduled or sooner if needed       Fenton Foy, NP 05/16/2018

## 2018-05-16 NOTE — Patient Instructions (Addendum)
Please complete entire course of doxycyline and prednisone Continue symbicort Continue spiriva Will refill albuterol neb treatments Will give samples of mucinex Follow up with Dr. Lake Bells as scheduled or sooner if needed

## 2018-05-16 NOTE — Progress Notes (Signed)
Reviewed, agree 

## 2018-05-16 NOTE — Assessment & Plan Note (Signed)
Patient is much improved today.  He has been compliant with doxycycline and prednisone taper.  Still has a few more days on each.  Denies any fever.  Patient Instructions  Please complete entire course of doxycyline and prednisone Continue symbicort Continue spiriva Will refill albuterol neb treatments Will give samples of mucinex Follow up with Dr. Lake Bells as scheduled or sooner if needed

## 2018-05-18 NOTE — Progress Notes (Signed)
Reviewed, agree 

## 2018-06-02 ENCOUNTER — Other Ambulatory Visit: Payer: Self-pay | Admitting: Internal Medicine

## 2018-06-03 MED ORDER — ALBUTEROL SULFATE (2.5 MG/3ML) 0.083% IN NEBU: 2.5000 mg | INHALATION_SOLUTION | Freq: Four times a day (QID) | RESPIRATORY_TRACT | 11 refills | Status: DC | PRN

## 2018-06-03 MED ORDER — ALBUTEROL SULFATE (2.5 MG/3ML) 0.083% IN NEBU: 2.5000 mg | INHALATION_SOLUTION | Freq: Four times a day (QID) | RESPIRATORY_TRACT | 5 refills | Status: DC | PRN

## 2018-06-06 ENCOUNTER — Other Ambulatory Visit: Payer: Self-pay

## 2018-06-06 MED ORDER — BACLOFEN 5 MG PO TABS: 5.0000 mg | ORAL_TABLET | Freq: Three times a day (TID) | ORAL | 1 refills | Status: DC

## 2018-06-16 ENCOUNTER — Other Ambulatory Visit: Payer: Self-pay | Admitting: Nurse Practitioner

## 2018-06-16 ENCOUNTER — Other Ambulatory Visit: Payer: Self-pay | Admitting: Internal Medicine

## 2018-06-16 DIAGNOSIS — I472 Ventricular tachycardia, unspecified: Secondary | ICD-10-CM

## 2018-06-16 DIAGNOSIS — I5022 Chronic systolic (congestive) heart failure: Secondary | ICD-10-CM

## 2018-06-16 DIAGNOSIS — I25810 Atherosclerosis of coronary artery bypass graft(s) without angina pectoris: Secondary | ICD-10-CM

## 2018-06-16 NOTE — Telephone Encounter (Signed)
Ok to refill 

## 2018-06-18 ENCOUNTER — Other Ambulatory Visit: Payer: Self-pay | Admitting: Internal Medicine

## 2018-06-18 ENCOUNTER — Other Ambulatory Visit: Payer: Self-pay | Admitting: Interventional Cardiology

## 2018-06-18 NOTE — Telephone Encounter (Signed)
Refill request okay

## 2018-06-30 ENCOUNTER — Other Ambulatory Visit: Payer: Self-pay

## 2018-06-30 ENCOUNTER — Ambulatory Visit (INDEPENDENT_AMBULATORY_CARE_PROVIDER_SITE_OTHER): Payer: Medicare Other | Admitting: *Deleted

## 2018-06-30 DIAGNOSIS — I472 Ventricular tachycardia, unspecified: Secondary | ICD-10-CM

## 2018-06-30 LAB — CUP PACEART REMOTE DEVICE CHECK
Battery Remaining Longevity: 42 mo
Battery Remaining Percentage: 50 %
Brady Statistic RA Percent Paced: 8 %
Brady Statistic RV Percent Paced: 0 %
Date Time Interrogation Session: 20200402095800
HighPow Impedance: 56 Ohm
Implantable Lead Implant Date: 20110610
Implantable Lead Implant Date: 20110610
Implantable Lead Location: 753859
Implantable Lead Location: 753860
Implantable Lead Model: 185
Implantable Lead Model: 4135
Implantable Lead Serial Number: 28681386
Implantable Lead Serial Number: 339643
Implantable Pulse Generator Implant Date: 20110610
Lead Channel Impedance Value: 449 Ohm
Lead Channel Impedance Value: 677 Ohm
Lead Channel Pacing Threshold Amplitude: 0.9 V
Lead Channel Pacing Threshold Amplitude: 0.9 V
Lead Channel Pacing Threshold Pulse Width: 0.4 ms
Lead Channel Pacing Threshold Pulse Width: 0.4 ms
Lead Channel Setting Pacing Amplitude: 2 V
Lead Channel Setting Pacing Amplitude: 2 V
Lead Channel Setting Pacing Pulse Width: 0.4 ms
Lead Channel Setting Sensing Sensitivity: 0.6 mV
Pulse Gen Serial Number: 164892

## 2018-07-01 ENCOUNTER — Telehealth: Payer: Self-pay

## 2018-07-01 ENCOUNTER — Other Ambulatory Visit: Payer: Self-pay

## 2018-07-01 MED ORDER — MEXILETINE HCL 200 MG PO CAPS: 200.0000 mg | ORAL_CAPSULE | Freq: Two times a day (BID) | ORAL | 3 refills | Status: DC

## 2018-07-01 NOTE — Telephone Encounter (Signed)
I spoke to the patient who informed me that he has been taking his Mexiletine 200 mg bid, not tid as is still prescribed.  He would like a 1 month refill only.  He said that his physicians are aware of this change.

## 2018-07-07 ENCOUNTER — Encounter: Payer: Self-pay | Admitting: Cardiology

## 2018-07-07 NOTE — Progress Notes (Signed)
Letter  

## 2018-07-07 NOTE — Progress Notes (Signed)
Remote ICD transmission.   

## 2018-07-23 ENCOUNTER — Other Ambulatory Visit: Payer: Self-pay | Admitting: Internal Medicine

## 2018-08-30 ENCOUNTER — Other Ambulatory Visit: Payer: Self-pay | Admitting: Internal Medicine

## 2018-09-03 ENCOUNTER — Other Ambulatory Visit: Payer: Self-pay | Admitting: Internal Medicine

## 2018-09-03 DIAGNOSIS — I25810 Atherosclerosis of coronary artery bypass graft(s) without angina pectoris: Secondary | ICD-10-CM

## 2018-09-03 DIAGNOSIS — I5022 Chronic systolic (congestive) heart failure: Secondary | ICD-10-CM

## 2018-09-05 ENCOUNTER — Telehealth: Payer: Self-pay

## 2018-09-05 NOTE — Telephone Encounter (Signed)
Left message for patient to call back to schedule virtual visit for baclofen refill.

## 2018-09-12 DIAGNOSIS — L84 Corns and callosities: Secondary | ICD-10-CM | POA: Diagnosis not present

## 2018-09-12 DIAGNOSIS — M79674 Pain in right toe(s): Secondary | ICD-10-CM | POA: Insufficient documentation

## 2018-09-12 DIAGNOSIS — M2042 Other hammer toe(s) (acquired), left foot: Secondary | ICD-10-CM | POA: Diagnosis not present

## 2018-09-12 DIAGNOSIS — M2041 Other hammer toe(s) (acquired), right foot: Secondary | ICD-10-CM | POA: Diagnosis not present

## 2018-09-12 DIAGNOSIS — M79675 Pain in left toe(s): Secondary | ICD-10-CM | POA: Diagnosis not present

## 2018-09-16 ENCOUNTER — Ambulatory Visit: Payer: Medicare Other | Admitting: Internal Medicine

## 2018-09-24 ENCOUNTER — Other Ambulatory Visit: Payer: Self-pay | Admitting: Internal Medicine

## 2018-09-25 ENCOUNTER — Encounter: Payer: Self-pay | Admitting: Internal Medicine

## 2018-09-28 ENCOUNTER — Other Ambulatory Visit: Payer: Self-pay

## 2018-09-28 ENCOUNTER — Other Ambulatory Visit (INDEPENDENT_AMBULATORY_CARE_PROVIDER_SITE_OTHER): Payer: Medicare Other

## 2018-09-28 ENCOUNTER — Ambulatory Visit (INDEPENDENT_AMBULATORY_CARE_PROVIDER_SITE_OTHER): Payer: Medicare Other | Admitting: Internal Medicine

## 2018-09-28 ENCOUNTER — Encounter: Payer: Self-pay | Admitting: Internal Medicine

## 2018-09-28 VITALS — BP 100/64 | HR 67 | Temp 98.3°F | Ht 71.0 in | Wt 188.0 lb

## 2018-09-28 DIAGNOSIS — E538 Deficiency of other specified B group vitamins: Secondary | ICD-10-CM

## 2018-09-28 DIAGNOSIS — F324 Major depressive disorder, single episode, in partial remission: Secondary | ICD-10-CM

## 2018-09-28 DIAGNOSIS — I5022 Chronic systolic (congestive) heart failure: Secondary | ICD-10-CM

## 2018-09-28 DIAGNOSIS — I255 Ischemic cardiomyopathy: Secondary | ICD-10-CM | POA: Diagnosis not present

## 2018-09-28 DIAGNOSIS — E559 Vitamin D deficiency, unspecified: Secondary | ICD-10-CM

## 2018-09-28 DIAGNOSIS — D500 Iron deficiency anemia secondary to blood loss (chronic): Secondary | ICD-10-CM

## 2018-09-28 DIAGNOSIS — J410 Simple chronic bronchitis: Secondary | ICD-10-CM | POA: Diagnosis not present

## 2018-09-28 DIAGNOSIS — R195 Other fecal abnormalities: Secondary | ICD-10-CM

## 2018-09-28 DIAGNOSIS — R634 Abnormal weight loss: Secondary | ICD-10-CM | POA: Diagnosis not present

## 2018-09-28 LAB — COMPREHENSIVE METABOLIC PANEL
ALT: 15 U/L (ref 0–53)
AST: 13 U/L (ref 0–37)
Albumin: 4.2 g/dL (ref 3.5–5.2)
Alkaline Phosphatase: 43 U/L (ref 39–117)
BUN: 11 mg/dL (ref 6–23)
CO2: 29 mEq/L (ref 19–32)
Calcium: 9.6 mg/dL (ref 8.4–10.5)
Chloride: 102 mEq/L (ref 96–112)
Creatinine, Ser: 0.92 mg/dL (ref 0.40–1.50)
GFR: 79.19 mL/min (ref >60.00)
Glucose, Bld: 69 mg/dL — ABNORMAL LOW (ref 70–99)
Potassium: 3.6 mEq/L (ref 3.5–5.1)
Sodium: 138 mEq/L (ref 135–145)
Total Bilirubin: 0.6 mg/dL (ref 0.2–1.2)
Total Protein: 6.7 g/dL (ref 6.0–8.3)

## 2018-09-28 LAB — CBC
HCT: 41.8 % (ref 39.0–52.0)
Hemoglobin: 13.7 g/dL (ref 13.0–17.0)
MCHC: 32.8 g/dL (ref 30.0–36.0)
MCV: 100.4 fl — ABNORMAL HIGH (ref 78.0–100.0)
Platelets: 209 10*3/uL (ref 150.0–400.0)
RBC: 4.16 Mil/uL — ABNORMAL LOW (ref 4.22–5.81)
RDW: 14.9 % (ref 11.5–15.5)
WBC: 7.2 10*3/uL (ref 4.0–10.5)

## 2018-09-28 LAB — VITAMIN B12: Vitamin B-12: 241 pg/mL (ref 211–911)

## 2018-09-28 LAB — T4, FREE: Free T4: 1.14 ng/dL (ref 0.60–1.60)

## 2018-09-28 LAB — VITAMIN D 25 HYDROXY (VIT D DEFICIENCY, FRACTURES): VITD: 25.72 ng/mL — ABNORMAL LOW (ref 30.00–100.00)

## 2018-09-28 LAB — TSH: TSH: 1.97 u[IU]/mL (ref 0.35–4.50)

## 2018-09-28 LAB — FERRITIN: Ferritin: 52.6 ng/mL (ref 22.0–322.0)

## 2018-09-28 LAB — MAGNESIUM: Magnesium: 1.7 mg/dL (ref 1.5–2.5)

## 2018-09-28 MED ORDER — BUPROPION HCL 75 MG PO TABS: ORAL_TABLET | ORAL | 1 refills | Status: DC

## 2018-09-28 NOTE — Patient Instructions (Signed)
We will check some labs today to look at the cause for weight loss and levels.

## 2018-09-28 NOTE — Progress Notes (Signed)
Subjective:   Patient ID: Steven Guzman, male    DOB: December 09, 1940, 78 y.o.   MRN: [id number]  HPI The patient is an 78 YO man coming in for several concerns including weight loss (down about 10 pounds in the last 6 months, clothes are not fitting as well recently and had to change sizes, admits to no change with diet, denies fevers or chills, does have some dark stools, last colonoscopy and endoscopy sept 2019 without cause, had CT chest abdomen and pelvis to help find cause and there were no suspicious lesions, has tried nothing, overall it is stable) and dark stools (intermittent and he was worried that it was his iron pills so he stopped taking them and he is still having these, not all the time, over the last several weeks, denies lightheadedness or dizziness, has chronic SOB and this is overall a little worse, he has started smoking again) and SOB (started smoking again, this does limit him), and back pain (he took his wife's hydrocodone pill and this did not take again the pain but did take the edge off it, normally does not take anything, hurts most days lately, the last week or so after taking hydrocodone he is feeling better, wants to see an orthopedic about this). He also has other chronic health concerns and is concerned about urinating all night (getting up every 2 hours, taking flomax in the evening and lasix 1/2 pill daily, does also urinate a lot during the day, no problems with pain on urination, feels he is on too many pills and wants to know if he can stop any, taking potassium and magnesium daily and is unsure if he still needs those).   Review of Systems  Constitutional: Positive for activity change and unexpected weight change. Negative for appetite change, diaphoresis, fatigue and fever.  HENT: Negative.   Eyes: Negative.   Respiratory: Positive for cough and shortness of breath. Negative for chest tightness.   Cardiovascular: Negative for chest pain, palpitations and leg  swelling.  Gastrointestinal: Positive for blood in stool. Negative for abdominal distention, abdominal pain, anal bleeding, constipation, diarrhea, nausea, rectal pain and vomiting.  Musculoskeletal: Positive for arthralgias and back pain. Negative for gait problem and joint swelling.  Skin: Positive for color change.  Neurological: Negative.   Psychiatric/Behavioral: Negative.     Objective:  Physical Exam Constitutional:      Appearance: He is well-developed.     Comments: Chronically ill appearing, smelling of tobacco smoke  HENT:     Head: Normocephalic and atraumatic.  Neck:     Musculoskeletal: Normal range of motion.  Cardiovascular:     Rate and Rhythm: Normal rate and regular rhythm.  Pulmonary:     Effort: Pulmonary effort is normal.     Breath sounds: Wheezing and rhonchi present. No rales.     Comments: Chronic expiratory wheezing and rhonchi, unchanged from prior Abdominal:     General: Bowel sounds are normal. There is no distension.     Palpations: Abdomen is soft.     Tenderness: There is no abdominal tenderness. There is no rebound.  Musculoskeletal:        General: Tenderness present.  Skin:    General: Skin is warm and dry.     Findings: Bruising present.     Comments: Chronic dark color changes from prior bruising on the arms and legs  Neurological:     Mental Status: He is alert and oriented to person, place, and time.  Coordination: Coordination normal.     Vitals:   09/28/18 0944  BP: 100/64  Pulse: 67  Temp: 98.3 F (36.8 C)  TempSrc: Oral  SpO2: 95%  Weight: 188 lb (85.3 kg)  Height: 5\' 11"  (1.803 m)    Assessment & Plan:  Visit time 40 minutes: greater than 50% of that time was spent in face to face counseling and coordination of care with the patient: counseled about chronic health problems, causes for recent weight loss, dark stools, potential treatments as well as potential testing needed to investigate, as well as the need for his  lasix and potassium and magnesium long term.

## 2018-09-29 ENCOUNTER — Telehealth: Payer: Self-pay

## 2018-09-29 ENCOUNTER — Ambulatory Visit (INDEPENDENT_AMBULATORY_CARE_PROVIDER_SITE_OTHER): Payer: Medicare Other | Admitting: *Deleted

## 2018-09-29 DIAGNOSIS — R195 Other fecal abnormalities: Secondary | ICD-10-CM | POA: Insufficient documentation

## 2018-09-29 DIAGNOSIS — I472 Ventricular tachycardia, unspecified: Secondary | ICD-10-CM

## 2018-09-29 DIAGNOSIS — R634 Abnormal weight loss: Secondary | ICD-10-CM | POA: Insufficient documentation

## 2018-09-29 NOTE — Telephone Encounter (Signed)
Left message for patient to remind of missed remote transmission.  

## 2018-09-29 NOTE — Assessment & Plan Note (Signed)
Will allow him to use lasix 1/2 pill every other day. Checking potassium and magnesium today as he is on amiodarone these goals are to be well within normal. He may need to continue on daily magnesium and potassium supplementation.

## 2018-09-29 NOTE — Assessment & Plan Note (Signed)
Needs refill of wellbutrin which is working well enough for now. Coping okay with the pandemic.

## 2018-09-29 NOTE — Assessment & Plan Note (Signed)
Needs recheck

## 2018-09-29 NOTE — Assessment & Plan Note (Addendum)
Suspect chronic blood loss from xarelto and diverticulosis. Will check CBC today to rule out large volume blood loss. Colonoscopy and endoscopy 2019 without treatable lesion causing blood loss. He is aged out of further screenings unless significant drop in blood counts.

## 2018-09-29 NOTE — Assessment & Plan Note (Signed)
He has resumed smoking at this time and strongly encouraged him to stop for his health. He is not sure he is able to do that at this time. He has already had serious complications of smoking and counseled on potential further problems that could be caused by continued smoking.

## 2018-09-29 NOTE — Assessment & Plan Note (Signed)
Weight is down about 10 pounds in the last several months. It is unclear if this could be related to lung status with recent smoking. He had CT chest abdomen and pelvis in September without suspicious lesion. Colonoscopy and endoscopy done around the same time also without suspicious lesions. Given smoking history if weight loss continues can consider ENT referral to rule out throat malignancy.

## 2018-09-29 NOTE — Assessment & Plan Note (Signed)
Checking B12 and ferritin and CBC given the new dark stools. He is on xarelto daily and has diverticulosis which can cause some blood loss chronic. Prior colonoscopy and endoscopy and CT chest, abdomen and pelvis September 2019 without cause or treatable lesion for bleeding.

## 2018-10-02 LAB — CUP PACEART REMOTE DEVICE CHECK
Battery Remaining Longevity: 42 mo
Battery Remaining Percentage: 47 %
Brady Statistic RA Percent Paced: 6 %
Brady Statistic RV Percent Paced: 0 %
Date Time Interrogation Session: 20200702215300
HighPow Impedance: 58 Ohm
Implantable Lead Implant Date: 20110610
Implantable Lead Implant Date: 20110610
Implantable Lead Location: 753859
Implantable Lead Location: 753860
Implantable Lead Model: 185
Implantable Lead Model: 4135
Implantable Lead Serial Number: 28681386
Implantable Lead Serial Number: 339643
Implantable Pulse Generator Implant Date: 20110610
Lead Channel Impedance Value: 461 Ohm
Lead Channel Impedance Value: 666 Ohm
Lead Channel Pacing Threshold Amplitude: 0.9 V
Lead Channel Pacing Threshold Amplitude: 0.9 V
Lead Channel Pacing Threshold Pulse Width: 0.4 ms
Lead Channel Pacing Threshold Pulse Width: 0.4 ms
Lead Channel Setting Pacing Amplitude: 2 V
Lead Channel Setting Pacing Amplitude: 2 V
Lead Channel Setting Pacing Pulse Width: 0.4 ms
Lead Channel Setting Sensing Sensitivity: 0.6 mV
Pulse Gen Serial Number: 164892

## 2018-10-06 ENCOUNTER — Encounter: Payer: Self-pay | Admitting: Cardiology

## 2018-10-06 NOTE — Progress Notes (Signed)
Remote ICD transmission.   

## 2018-10-12 DIAGNOSIS — L84 Corns and callosities: Secondary | ICD-10-CM | POA: Diagnosis not present

## 2018-10-12 DIAGNOSIS — M79675 Pain in left toe(s): Secondary | ICD-10-CM | POA: Diagnosis not present

## 2018-10-12 DIAGNOSIS — M79674 Pain in right toe(s): Secondary | ICD-10-CM | POA: Diagnosis not present

## 2018-10-12 DIAGNOSIS — M2041 Other hammer toe(s) (acquired), right foot: Secondary | ICD-10-CM | POA: Diagnosis not present

## 2018-10-12 DIAGNOSIS — M2042 Other hammer toe(s) (acquired), left foot: Secondary | ICD-10-CM | POA: Diagnosis not present

## 2018-10-14 DIAGNOSIS — M545 Low back pain: Secondary | ICD-10-CM | POA: Diagnosis not present

## 2018-10-14 DIAGNOSIS — M4317 Spondylolisthesis, lumbosacral region: Secondary | ICD-10-CM | POA: Insufficient documentation

## 2018-10-14 DIAGNOSIS — M810 Age-related osteoporosis without current pathological fracture: Secondary | ICD-10-CM | POA: Diagnosis not present

## 2018-10-19 ENCOUNTER — Encounter: Payer: Self-pay | Admitting: Internal Medicine

## 2018-10-21 ENCOUNTER — Other Ambulatory Visit: Payer: Self-pay | Admitting: Internal Medicine

## 2018-10-21 ENCOUNTER — Other Ambulatory Visit: Payer: Self-pay | Admitting: Nurse Practitioner

## 2018-10-21 ENCOUNTER — Telehealth: Payer: Self-pay

## 2018-10-21 NOTE — Telephone Encounter (Signed)
Needs follow up appointment. No answer and can't leave message because no voicemail comes on. Phone just hangs up.

## 2018-10-24 DIAGNOSIS — M4317 Spondylolisthesis, lumbosacral region: Secondary | ICD-10-CM | POA: Diagnosis not present

## 2018-10-24 DIAGNOSIS — R0781 Pleurodynia: Secondary | ICD-10-CM | POA: Diagnosis not present

## 2018-10-24 DIAGNOSIS — R634 Abnormal weight loss: Secondary | ICD-10-CM | POA: Diagnosis not present

## 2018-10-24 DIAGNOSIS — M47812 Spondylosis without myelopathy or radiculopathy, cervical region: Secondary | ICD-10-CM | POA: Diagnosis not present

## 2018-10-24 DIAGNOSIS — M19011 Primary osteoarthritis, right shoulder: Secondary | ICD-10-CM | POA: Diagnosis not present

## 2018-10-24 DIAGNOSIS — Z87891 Personal history of nicotine dependence: Secondary | ICD-10-CM | POA: Diagnosis not present

## 2018-10-24 DIAGNOSIS — M19031 Primary osteoarthritis, right wrist: Secondary | ICD-10-CM | POA: Diagnosis not present

## 2018-10-24 DIAGNOSIS — M19012 Primary osteoarthritis, left shoulder: Secondary | ICD-10-CM | POA: Diagnosis not present

## 2018-10-24 DIAGNOSIS — M546 Pain in thoracic spine: Secondary | ICD-10-CM | POA: Diagnosis not present

## 2018-10-24 DIAGNOSIS — M19071 Primary osteoarthritis, right ankle and foot: Secondary | ICD-10-CM | POA: Diagnosis not present

## 2018-10-24 DIAGNOSIS — M545 Low back pain: Secondary | ICD-10-CM | POA: Diagnosis not present

## 2018-10-25 ENCOUNTER — Other Ambulatory Visit: Payer: Self-pay | Admitting: Interventional Cardiology

## 2018-10-25 ENCOUNTER — Other Ambulatory Visit: Payer: Self-pay | Admitting: Internal Medicine

## 2018-10-26 ENCOUNTER — Telehealth: Payer: Self-pay

## 2018-10-26 NOTE — Telephone Encounter (Signed)
Spoke with Pt.  Pt agreeable to appt with LI tomorrow to discuss potential amiodarone side effects.  Pt also do for 6 mo f/u with labs.  Pt denies any s/s of covid 19 or any sick contacts.

## 2018-10-26 NOTE — Telephone Encounter (Signed)

## 2018-10-26 NOTE — Progress Notes (Signed)
Cardiology Office Note   Date:  10/27/2018   ID:  Steven, Guzman May 31, 1940, MRN [id number]  PCP:  Hoyt Koch, MD  Cardiologist:  Dr. Tamala Julian    Chief Complaint  Patient presents with  . Medication Problem  . Irregular Heart Beat    V tach      History of Present Illness: Steven Guzman is a 78 y.o. male who presents for side effects to amiodarone.  He has a hx of Ischemic cardiomyopathy with EF 35%, COPD, hypertension, recent low blood pressures, CAD with prior coronary bypass grafting, continued tobacco abuse, PAF, ventricular tachycardia controlled on amiodarone and Mexitil, and implantable cardiac defibrillator. Hx VT ablation.   On last visit 04/25/18 stable CAD, no further VT on amiodarone.  Stable chronic HF --ASA was decreased to MWF and continue xarelto  Last device check stable.    Today he is concerned that amiodarone is playing a role in his wt loss.  Last Sept his weight was decreasing and he had CT abd pelvis and chest and EGD and colonoscopy. Cat scans all neg for suspicious malignancy. He does have cholelithiasis.  Did see 3.4 cm infrarenal AAA.  Recommendation of follow up in 3 years.     On EGD large hiatal hernia and colonoscopy with multiple polyps removed.  He has now lost 31 lbs over the last year.  He does have an appetite and eats at least 2 meals per day.  No chest pain and stable SOB.  No awareness of rapid HR.    He has labs including TSH and hepatic all normal.  No increase of SOB on amiodarone.   No ICD discharges.    Past Medical History:  Diagnosis Date  . AICD (automatic cardioverter/defibrillator) present   . Aneurysm ([place])    a. Aneurysmal infrarenal aorta up to 33 mm on CT 10/2014, recommended f/u due 10/2017  . Anginal pain ([place])   . Anxiety   . Basal cell carcinoma of nose    S/P MOHS  . Biliary acute pancreatitis   . CAD (coronary artery disease)    a. s/p MI in 1994 with PCI to LAD at that time b. cath 10/2012  demonstrated EF 30%, inferior akinesis with mild hypokinesis of all walls, patent LAD and RCA stents; ostial PDA with 80-90% obstruction with medical therapy recommended   . Chronic systolic CHF (congestive heart failure) (HCC)    EF 30 to 35 % as of 09/2014.   [name] CKD (chronic kidney disease), stage III ([place])   . Complication of anesthesia 10/2014   "had to have defibrillator w/ERCP"  . COPD (chronic obstructive pulmonary disease) ([place])    a. followed by pulmonary, COPD GOLD stage II  . Depression   . Diverticulosis of colon 07/2014   noted on CT  . GERD (gastroesophageal reflux disease)   . Hiatal hernia   . Hyperglycemia 10/2012.  [name] Hyperlipidemia   . Hypertension   . Myocardial infarction [facility]) 1994; 2011  . Pneumonia 1946; 2015  . Prostate enlargement 07/2014   observed on CT  . Tobacco abuse   . Ventricular tachycardia ([place])    a. 08/2009 s/p BSX E110 Teligen 100 AICD, ser#: [id number];  b. 08/2008 VT req ATP - detection reprogrammed from 160 to 150. c. EPS and VT ablation by Dr. Lovena Le 12/21/2014    Past Surgical History:  Procedure Laterality Date  . BIOPSY  12/21/2017   Procedure: BIOPSY;  Surgeon: [name], MD;  Location: [facility];  Service: Endoscopy;;  . CATARACT EXTRACTION W/ INTRAOCULAR LENS  IMPLANT, BILATERAL Bilateral ~ 2011  . COLONOSCOPY    . COLONOSCOPY WITH PROPOFOL N/A 12/21/2017   Procedure: COLONOSCOPY WITH PROPOFOL;  Surgeon: [name], MD;  Location: [facility];  Service: Endoscopy;  Laterality: N/A;  . ELECTROPHYSIOLOGIC STUDY N/A 12/21/2014   Procedure: V Tach Ablation;  Surgeon: [name], MD;  Location: [place] [facility];  Service: Cardiovascular;  Laterality: N/A;  . ERCP N/A 11/16/2014   Procedure: ENDOSCOPIC RETROGRADE CHOLANGIOPANCREATOGRAPHY (ERCP);  Surgeon: [name], MD;  Location: [place];  Service: Endoscopy;  Laterality: N/A;  . ESOPHAGOGASTRODUODENOSCOPY (EGD) WITH PROPOFOL N/A 12/21/2017   Procedure:  ESOPHAGOGASTRODUODENOSCOPY (EGD) WITH PROPOFOL;  Surgeon: [name], MD;  Location: [facility];  Service: Endoscopy;  Laterality: N/A;  . EYE SURGERY    . FOOT SURGERY Left 2005   "fixed bone that stuck out in my ankle area"  . HEMORRHOID BANDING    . IMPLANTABLE CARDIOVERTER DEFIBRILLATOR IMPLANT  09/06/09   BSX dual chamber ICD implanted in [place] for cardiac arrest 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  . INGUINAL HERNIA REPAIR Right ~ 1995  . LEFT HEART CATHETERIZATION WITH CORONARY ANGIOGRAM N/A 11/25/2012   demonstrated EF 30%, inferior akinesis with mild hypokinesis of all walls, patent LAD and RCA stents; ostial PDA with 80-90% obstruction with medical therapy recommended  . MOHS SURGERY  2008   nose, skin graft  . POLYPECTOMY  12/21/2017   Procedure: POLYPECTOMY;  Surgeon: [name], MD;  Location: [facility];  Service: Endoscopy;;  . RETINAL DETACHMENT SURGERY Right 2013  . TENOLYSIS Right 12/21/2013   Procedure: TENOLYSIS FLEXOR CARPI RADIALIS ,DEBRIDEMENT RIGHT JOINT WRIST,DEBRIDEMENT SCAPHOTRAPEZIAL TRAPEZOID, REPAIR OF EXTENSOR HOOD;  Surgeon: Daryll Brod, MD;  Location: [place];  Service: Orthopedics;  Laterality: Right;  . V-TACH ABLATION  12/21/2014  . VIDEO BRONCHOSCOPY Bilateral 01/09/2016   Procedure: VIDEO BRONCHOSCOPY WITHOUT FLUORO;  Surgeon: Juanito Doom, MD;  Location: [facility];  Service: Cardiopulmonary;  Laterality: Bilateral;     Current Outpatient Medications  Medication Sig Dispense Refill  . albuterol (PROVENTIL) (2.5 MG/3ML) 0.083% nebulizer solution Take 3 mLs (2.5 mg total) by nebulization every 6 (six) hours as needed for wheezing or shortness of breath. 360 mL 5  . amiodarone (PACERONE) 200 MG tablet TAKE 1 TABLET(200 MG) BY MOUTH DAILY 90 tablet 3  . Artificial Tear Solution (SOOTHE XP OP) Place 2 drops into both eyes daily as needed (dry eyes).    [name] aspirin EC 81 MG tablet Take 1 tablet (81 mg total) by mouth every Monday,  Wednesday, and Friday. 90 tablet 3  . atorvastatin (LIPITOR) 80 MG tablet TAKE 1 TABLET(80 MG) BY MOUTH DAILY 90 tablet 2  . benazepril (LOTENSIN) 10 MG tablet TAKE 1 TABLET BY MOUTH ONCE DAILY 90 tablet 3  . budesonide-formoterol (SYMBICORT) 160-4.5 MCG/ACT inhaler Inhale 2 puffs into the lungs 2 (two) times daily. 1 Inhaler 5  . buPROPion (WELLBUTRIN) 75 MG tablet TAKE 1 TABLET(75 MG) BY MOUTH DAILY 90 tablet 1  . busPIRone (BUSPAR) 15 MG tablet TAKE 1 TABLET(15 MG) BY MOUTH THREE TIMES DAILY 90 tablet 1  . carvedilol (COREG) 3.125 MG tablet TAKE 1 TABLET(3.125 MG) BY MOUTH TWICE DAILY 180 tablet 0  . cetirizine (ZYRTEC) 10 MG tablet Take 10 mg by mouth daily as needed for allergies.    . Cholecalciferol (VITAMIN D3) 2000 units CHEW Chew 4,000 Units  by mouth daily.     . Choline Fenofibrate (FENOFIBRIC ACID) 135 MG CPDR TAKE 1 CAPSULE BY MOUTH DAILY 30 capsule 5  . cyanocobalamin (,VITAMIN B-12,) 1000 MCG/ML injection Inject 1 mL (1,000 mcg total) into the muscle every 14 (fourteen) days. 6 mL 3  . escitalopram (LEXAPRO) 20 MG tablet TAKE 1 TABLET(20 MG) BY MOUTH DAILY 90 tablet 1  . fluticasone (FLONASE) 50 MCG/ACT nasal spray INSTILL 2 SPRAYS INTO EACH NOSTRIL QD    . furosemide (LASIX) 40 MG tablet TAKE 1/2 TABLET(20 MG) BY MOUTH DAILY 45 tablet 2  . Ipratropium-Albuterol (COMBIVENT RESPIMAT) 20-100 MCG/ACT AERS respimat Inhale 1 puff into the lungs every 6 (six) hours. Shortness of breath or wheezing 1 Inhaler 3  . IRON PO Take by mouth.    . Magnesium Oxide 400 MG CAPS Take 1 capsule (400 mg total) by mouth daily. 90 capsule 3  . mexiletine (MEXITIL) 200 MG capsule Take 1 capsule (200 mg total) by mouth 2 (two) times daily. 60 capsule 3  . Multiple Vitamins-Minerals (CENTRUM ADULTS PO) Take by mouth daily.    . nitroGLYCERIN (NITROSTAT) 0.4 MG SL tablet DISSOLVE 1 TABLET UNDER THE TONGUE EVERY 5 MINUTES FOR 3 DOSES AS NEEDED FOR CHEST PAIN 25 tablet 6  . Omega-3 Fatty Acids (FISH OIL) 1000  MG CAPS Take 1,000 mg by mouth daily.    [name] omeprazole (PRILOSEC) 20 MG capsule Take 20 mg by mouth daily.     . potassium chloride SA (K-DUR) 20 MEQ tablet TAKE 1 TABLET(20 MEQ) BY MOUTH DAILY 90 tablet 1  . Respiratory Therapy Supplies (FLUTTER) DEVI Use as directed 1 each 0  . Syringe/Needle, Disp, (SYRINGE 3CC/25GX5/8") 25G X 5/8" 3 ML MISC 1 each by Does not apply route every 14 (fourteen) days. 50 each 0  . tamsulosin (FLOMAX) 0.4 MG CAPS capsule TAKE 1 CAPSULE(0.4 MG) BY MOUTH DAILY AFTER SUPPER 90 capsule 0  . Tiotropium Bromide Monohydrate (SPIRIVA RESPIMAT) 2.5 MCG/ACT AERS Inhale 2 puffs into the lungs daily. 1 Inhaler 5  . XARELTO 20 MG TABS tablet TAKE 1 TABLET BY MOUTH EVERY DAY WITH SUPPER 30 tablet 9  . Baclofen 5 MG TABS Take 5 mg by mouth 3 (three) times daily. (Patient not taking: Reported on 10/27/2018) 90 tablet 1   No current facility-administered medications for this visit.     Allergies:   Sulfa antibiotics and Sulfasalazine    Social History:  The patient  reports that he has been smoking cigarettes. He has a 55.00 pack-year smoking history. He has never used smokeless tobacco. He reports current alcohol use. He reports that he does not use drugs.   Family History:  The patient's family history includes CAD in his father and mother; Heart attack in his brother; Hypertension in his brother, father, and mother.    ROS:  General:no colds or fevers, no weight changes Skin:no rashes or ulcers HEENT:no blurred vision, no congestion CV:see HPI PUL:see HPI GI:no diarrhea constipation or melena, no indigestion GU:no hematuria, no dysuria MS:no joint pain, no claudication Neuro:no syncope, no lightheadedness Endo:no diabetes, no thyroid disease  Wt Readings from Last 3 Encounters:  10/27/18 187 lb (84.8 kg)  09/28/18 188 lb (85.3 kg)  05/16/18 194 lb (88 kg)     PHYSICAL EXAM: VS:  BP 104/70   Pulse 70   Ht 5\' 11"  (1.803 m)   Wt 187 lb (84.8 kg)   BMI 26.08  kg/m  , BMI Body mass index is 26.08  kg/m. General:Pleasant affect, NAD Skin:Warm and dry, brisk capillary refill HEENT:normocephalic, sclera clear, mucus membranes moist Neck:supple, no JVD, no bruits  Heart:S1S2 RRR without murmur, gallup, rub or click Lungs:clear without rales, rhonchi, occ wheeze JTT:SVXB, non tender, + BS, do not palpate liver spleen or masses Ext:no lower ext edema, 2+ pedal pulses, 2+ radial pulses Neuro:alert and oriented X 3, MAE, follows commands, + facial symmetry    EKG:  EKG is ordered today. The ekg ordered today demonstrates SR at 70 with nonspecific intraventricular block and rt ward axis.     Recent Labs: 09/28/2018: ALT 15; BUN 11; Creatinine, Ser 0.92; Hemoglobin 13.7; Magnesium 1.7; Platelets 209.0; Potassium 3.6; Sodium 138; TSH 1.97    Lipid Panel    Component Value Date/Time   CHOL 157 09/21/2017 1546   TRIG 79 09/21/2017 1546   HDL 46 09/21/2017 1546   CHOLHDL 3.4 09/21/2017 1546   CHOLHDL 3 12/29/2016 1618   VLDL 16.6 12/29/2016 1618   [place] 95 09/21/2017 1546       Other studies Reviewed: Additional studies/ records that were reviewed today include:  Echo 09/15/16 . Study Conclusions  - Left ventricle: The cavity size was mildly dilated. There was   mild concentric hypertrophy. Systolic function was mildly to   moderately reduced. The estimated ejection fraction was in the   range of 40% to 45%. Hypokinesis of the basal-mid inferior,   inferoseptal, anteroseptal, and inferolateral myocardium Doppler   parameters are consistent with abnormal left ventricular   relaxation (grade 1 diastolic dysfunction). Doppler parameters   are consistent with indeterminate ventricular filling pressure. - Aortic valve: Transvalvular velocity was within the normal range.   There was no stenosis. There was no regurgitation. - Ascending aorta: The ascending aorta was mildly dilated. - Mitral valve: There was trivial regurgitation. - Left  atrium: The atrium was severely dilated. - Right ventricle: The cavity size was normal. Wall thickness was   normal. Systolic function was normal. - Right atrium: The atrium was severely dilated. - Atrial septum: No defect or patent foramen ovale was identified   by color flow Doppler. - Tricuspid valve: There was no regurgitation.   ASSESSMENT AND PLAN:  1.  Medication possible side effects.  Pt with wt loss of 31 lbs over last year.  Labs are normal. He is concerned amiodarone may be cause.  He has an appetite, eats usually twice a day.  Dr. Lovena Le  Concern if amiodarone is causing.  Will defer to Dr. Lovena Le, if we should decrease, though this may put pt at higher risk of PAF or VT.  2.  Wt. Loss has had work up but continues with [facility]. Loss  3.  Anticoagulation on xarelto.    4.  Chronic systolic HF but euvolemic, now with wt loss.   5.  ICM with ICD followed by Dr. Lovena Le and Dr. Tamala Julian.    6.  PAF maintaining SR.     Current medicines are reviewed with the patient today.  The patient Has no concerns regarding medicines.  The following changes have been made:  See above Labs/ tests ordered today include:see above  Disposition:   FU:  see above  Signed, Cecilie Kicks, NP  10/27/2018 8:40 AM    [address], [place], [place], [place] Phone: [phone]; Fax: [phone]

## 2018-10-27 ENCOUNTER — Ambulatory Visit (INDEPENDENT_AMBULATORY_CARE_PROVIDER_SITE_OTHER): Payer: Medicare Other | Admitting: Cardiology

## 2018-10-27 ENCOUNTER — Other Ambulatory Visit: Payer: Self-pay

## 2018-10-27 ENCOUNTER — Encounter: Payer: Self-pay | Admitting: Cardiology

## 2018-10-27 VITALS — BP 104/70 | HR 70 | Ht 71.0 in | Wt 187.0 lb

## 2018-10-27 DIAGNOSIS — D0439 Carcinoma in situ of skin of other parts of face: Secondary | ICD-10-CM | POA: Diagnosis not present

## 2018-10-27 DIAGNOSIS — L570 Actinic keratosis: Secondary | ICD-10-CM | POA: Diagnosis not present

## 2018-10-27 DIAGNOSIS — I472 Ventricular tachycardia, unspecified: Secondary | ICD-10-CM

## 2018-10-27 DIAGNOSIS — D044 Carcinoma in situ of skin of scalp and neck: Secondary | ICD-10-CM | POA: Diagnosis not present

## 2018-10-27 DIAGNOSIS — D0422 Carcinoma in situ of skin of left ear and external auricular canal: Secondary | ICD-10-CM | POA: Diagnosis not present

## 2018-10-27 DIAGNOSIS — Z95810 Presence of automatic (implantable) cardiac defibrillator: Secondary | ICD-10-CM | POA: Diagnosis not present

## 2018-10-27 DIAGNOSIS — I255 Ischemic cardiomyopathy: Secondary | ICD-10-CM

## 2018-10-27 DIAGNOSIS — I480 Paroxysmal atrial fibrillation: Secondary | ICD-10-CM | POA: Diagnosis not present

## 2018-10-27 DIAGNOSIS — L820 Inflamed seborrheic keratosis: Secondary | ICD-10-CM | POA: Diagnosis not present

## 2018-10-27 DIAGNOSIS — C44629 Squamous cell carcinoma of skin of left upper limb, including shoulder: Secondary | ICD-10-CM | POA: Diagnosis not present

## 2018-10-27 DIAGNOSIS — L821 Other seborrheic keratosis: Secondary | ICD-10-CM | POA: Diagnosis not present

## 2018-10-27 NOTE — Patient Instructions (Signed)
Medication Instructions:  Your physician recommends that you continue on your current medications as directed. Please refer to the Current Medication list given to you today.  If you need a refill on your cardiac medications before your next appointment, please call your pharmacy.   Lab work: NONE ORDERED  TODAY    If you have labs (blood work) drawn today and your tests are completely normal, you will receive your results only by: [name] MyChart Message (if you have MyChart) OR . A paper copy in the mail If you have any lab test that is abnormal or we need to change your treatment, we will call you to review the results.  Testing/Procedures: NONE ORDERED  TODAY  Follow-Up: At [facility], you and your health needs are our priority.  As part of our continuing mission to provide you with exceptional heart care, we have created designated Provider Care Teams.  These Care Teams include your primary Cardiologist (physician) and Advanced Practice Providers (APPs -  Physician Assistants and Nurse Practitioners) who all work together to provide you with the care you need, when you need it. You will need a follow up appointment in 3  months  You may see Sinclair Grooms, MD or one of the following Advanced Practice Providers on your designated Care Team:   Truitt Merle, NP Cecilie Kicks, NP . Kathyrn Drown, NP  Any Other Special Instructions Will Be Listed Below (If Applicable).

## 2018-10-28 ENCOUNTER — Ambulatory Visit: Payer: Self-pay | Admitting: *Deleted

## 2018-10-28 NOTE — Telephone Encounter (Signed)
Please advise 

## 2018-10-28 NOTE — Telephone Encounter (Signed)
Can observe for now and if happens again should have apt virtual with [place].

## 2018-10-28 NOTE — Telephone Encounter (Signed)
Patient informed of results and stated understanding

## 2018-10-28 NOTE — Telephone Encounter (Signed)
Pt called stating that this morning he coughed up blood in his sputum; he says that it was dark red; he has expectorated since then, and it was clear; the ot says that there was a spot of blood on his pillow this morning; this occurred 10/28/2018 at 0600; the pt takes Xarelto; recommendations made per nurse triage protocol; he verbalized understanding; the pt sees Dr Sharlet Salina, [place]; notified Tammy and she requested that this information be routed to the office for provider review; notified pt and advised him proceed to ED if symptoms worsen; he again verbalized understanding; he can be contacted at [phone].   Reason for Disposition . [1] Coughed up blood AND [2] > 1 tablespoon (15 ml) (Exception: blood-tinged sputum)  Answer Assessment - Initial Assessment Questions 1. ONSET: "When did you start coughing up blood?"     10/28/2018 2. SEVERITY: "How many times?" "How much blood?" (e.g., flecks, streaks, tablespoons, etc)     3-4 tablespoons 3. COUGHING SPASMS: "Did the blood appear after a coughing spell?"       no 4. RESPIRATORY DISTRESS: "Describe your breathing."      breathing normal 5. FEVER: "Do you have a fever?" If so, ask: "What is your temperature, how was it measured, and when did it start?"     no 6. SPUTUM: "Describe the color of your sputum" (clear, white, yellow, green), "Has there been any change recently?"    clear 7. CARDIAC HISTORY: "Do you have any history of heart disease?" (e.g., heart attack, congestive heart failure)      yes 8. LUNG HISTORY: "Do you have any history of lung disease?"  (e.g., pulmonary embolus, asthma, emphysema)     COPD 9. PE RISK FACTORS: "Do you have a history of blood clots?" (or: recent major surgery, recent prolonged travel, bedridden)     Pt takes Xarelto 10. OTHER SYMPTOMS: "Do you have any other symptoms?" (e.g., nosebleed, chest pain, abdominal pain, vomiting)       Pain in pelvic area when coughing 11. PREGNANCY: "Is there any chance  you are pregnant?" "When was your last menstrual period?"       n/a 12. TRAVEL: "Have you traveled out of the country in the last month?" (e.g., travel history, exposures)      no  Protocols used: COUGHING UP BLOOD-A-AH

## 2018-10-31 ENCOUNTER — Other Ambulatory Visit: Payer: Self-pay

## 2018-10-31 DIAGNOSIS — K4090 Unilateral inguinal hernia, without obstruction or gangrene, not specified as recurrent: Secondary | ICD-10-CM | POA: Diagnosis not present

## 2018-11-02 ENCOUNTER — Other Ambulatory Visit: Payer: Self-pay | Admitting: Internal Medicine

## 2018-11-02 DIAGNOSIS — I480 Paroxysmal atrial fibrillation: Secondary | ICD-10-CM

## 2018-11-03 NOTE — Telephone Encounter (Signed)
Xarelto 20mg  refill request received; pt is 78 yrs old, wt-84.8kg, Crea-0.92 on 09/28/2018, last seen by Cecilie Kicks on 10/27/2018, diagnosis PAF, CrCl-78.50ml/min; will send in refill.

## 2018-11-04 DIAGNOSIS — M8589 Other specified disorders of bone density and structure, multiple sites: Secondary | ICD-10-CM | POA: Diagnosis not present

## 2018-11-04 DIAGNOSIS — M810 Age-related osteoporosis without current pathological fracture: Secondary | ICD-10-CM | POA: Diagnosis not present

## 2018-11-07 DIAGNOSIS — D0439 Carcinoma in situ of skin of other parts of face: Secondary | ICD-10-CM | POA: Diagnosis not present

## 2018-11-07 DIAGNOSIS — Z85828 Personal history of other malignant neoplasm of skin: Secondary | ICD-10-CM | POA: Diagnosis not present

## 2018-11-07 DIAGNOSIS — D044 Carcinoma in situ of skin of scalp and neck: Secondary | ICD-10-CM | POA: Diagnosis not present

## 2018-11-07 DIAGNOSIS — L570 Actinic keratosis: Secondary | ICD-10-CM | POA: Diagnosis not present

## 2018-11-07 DIAGNOSIS — C44629 Squamous cell carcinoma of skin of left upper limb, including shoulder: Secondary | ICD-10-CM | POA: Diagnosis not present

## 2018-11-09 ENCOUNTER — Other Ambulatory Visit: Payer: Self-pay | Admitting: Internal Medicine

## 2018-11-10 DIAGNOSIS — J432 Centrilobular emphysema: Secondary | ICD-10-CM

## 2018-11-11 MED ORDER — BUDESONIDE-FORMOTEROL FUMARATE 160-4.5 MCG/ACT IN AERO: 2.0000 | INHALATION_SPRAY | Freq: Two times a day (BID) | RESPIRATORY_TRACT | 3 refills | Status: DC

## 2018-11-14 DIAGNOSIS — T1490XA Injury, unspecified, initial encounter: Secondary | ICD-10-CM | POA: Diagnosis not present

## 2018-11-15 ENCOUNTER — Encounter: Payer: Self-pay | Admitting: Internal Medicine

## 2018-11-15 ENCOUNTER — Other Ambulatory Visit: Payer: Self-pay

## 2018-11-15 ENCOUNTER — Ambulatory Visit (INDEPENDENT_AMBULATORY_CARE_PROVIDER_SITE_OTHER): Payer: Medicare Other | Admitting: Internal Medicine

## 2018-11-15 VITALS — BP 116/76 | HR 69 | Temp 98.0°F | Ht 71.0 in | Wt 188.0 lb

## 2018-11-15 DIAGNOSIS — S81801A Unspecified open wound, right lower leg, initial encounter: Secondary | ICD-10-CM | POA: Insufficient documentation

## 2018-11-15 DIAGNOSIS — K610 Anal abscess: Secondary | ICD-10-CM | POA: Diagnosis not present

## 2018-11-15 DIAGNOSIS — I255 Ischemic cardiomyopathy: Secondary | ICD-10-CM

## 2018-11-15 DIAGNOSIS — I10 Essential (primary) hypertension: Secondary | ICD-10-CM | POA: Diagnosis not present

## 2018-11-15 DIAGNOSIS — K4090 Unilateral inguinal hernia, without obstruction or gangrene, not specified as recurrent: Secondary | ICD-10-CM | POA: Diagnosis not present

## 2018-11-15 DIAGNOSIS — S81801D Unspecified open wound, right lower leg, subsequent encounter: Secondary | ICD-10-CM | POA: Diagnosis not present

## 2018-11-15 MED ORDER — DOXYCYCLINE HYCLATE 100 MG PO TABS: 100.0000 mg | ORAL_TABLET | Freq: Two times a day (BID) | ORAL | 0 refills | Status: DC

## 2018-11-15 NOTE — Assessment & Plan Note (Signed)
With swelling, little symptoms, refer General Surgury

## 2018-11-15 NOTE — Assessment & Plan Note (Signed)
Moderate persistent x 1 wk, no evidence for abscess or infection, to continue the silvadene, and refer general surgury

## 2018-11-15 NOTE — Patient Instructions (Signed)
Please take all new medication as prescribed - the antibiotic  You will be contacted regarding the referral for: General Surgury (to see [facility] now) regarding the Perianal abscess, right leg wound, and the left hernia  Please continue all other medications as before, and refills have been done if requested.  Please have the pharmacy call with any other refills you may need.  Please keep your appointments with your specialists as you may have planned

## 2018-11-15 NOTE — Progress Notes (Signed)
Subjective:    Patient ID: Steven Guzman, male    DOB: Feb 12, 1941, 78 y.o.   MRN: [id number]  HPI  Here after fall 1 wk ago, but scraped the skin to upper right gastroc area, tx with neosporin and bandage, but still does not appear to be healing.  Saw UC yesterday, who suggested he might need wound management, and added silvadene topical.  No fever  Also c/o perianal area swelling tender starting earlier today, mild to mod, constant, without fever, drainage.  Has hx of similar in the past that drained s/po I and D. Worse to sit ' Pt denies chest pain, increased sob or doe, wheezing, orthopnea, PND, increased LE swelling, palpitations, dizziness or syncope.   Pt denies polydipsia, polyuria, no hx of DM   Also has new LIH hernia per urology seen 2 wks ago, but referral not done yet.   Past Medical History:  Diagnosis Date  . AICD (automatic cardioverter/defibrillator) present   . Aneurysm ([place])    a. Aneurysmal infrarenal aorta up to 33 mm on CT 10/2014, recommended f/u due 10/2017  . Anginal pain ([place])   . Anxiety   . Basal cell carcinoma of nose    S/P MOHS  . Biliary acute pancreatitis   . CAD (coronary artery disease)    a. s/p MI in 1994 with PCI to LAD at that time b. cath 10/2012 demonstrated EF 30%, inferior akinesis with mild hypokinesis of all walls, patent LAD and RCA stents; ostial PDA with 80-90% obstruction with medical therapy recommended   . Chronic systolic CHF (congestive heart failure) (H[facility])    EF 30 to 35 % as of 09/2014.   [name] CKD (chronic kidney disease), stage III ([place])   . Complication of anesthesia 10/2014   "had to have defibrillator w/ERCP"  . COPD (chronic obstructive pulmonary disease) ([place])    a. followed by pulmonary, COPD GOLD stage II  . Depression   . Diverticulosis of colon 07/2014   noted on CT  . GERD (gastroesophageal reflux disease)   . Hiatal hernia   . Hyperglycemia 10/2012.  [name] Hyperlipidemia   . Hypertension   . Myocardial infarction [facility])  1994; 2011  . Pneumonia 1946; 2015  . Prostate enlargement 07/2014   observed on CT  . Tobacco abuse   . Ventricular tachycardia ([place])    a. 08/2009 s/p BSX E110 Teligen 100 AICD, ser#: [id number];  b. 08/2008 VT req ATP - detection reprogrammed from 160 to 150. c. EPS and VT ablation by Dr. Lovena Le 12/21/2014   Past Surgical History:  Procedure Laterality Date  . BIOPSY  12/21/2017   Procedure: BIOPSY;  Surgeon: [name], MD;  Location: [facility];  Service: Endoscopy;;  . CATARACT EXTRACTION W/ INTRAOCULAR LENS  IMPLANT, BILATERAL Bilateral ~ 2011  . COLONOSCOPY    . COLONOSCOPY WITH PROPOFOL N/A 12/21/2017   Procedure: COLONOSCOPY WITH PROPOFOL;  Surgeon: [name], MD;  Location: [facility];  Service: Endoscopy;  Laterality: N/A;  . ELECTROPHYSIOLOGIC STUDY N/A 12/21/2014   Procedure: V Tach Ablation;  Surgeon: [name], MD;  Location: [place] [facility];  Service: Cardiovascular;  Laterality: N/A;  . ERCP N/A 11/16/2014   Procedure: ENDOSCOPIC RETROGRADE CHOLANGIOPANCREATOGRAPHY (ERCP);  Surgeon: [name], MD;  Location: [place];  Service: Endoscopy;  Laterality: N/A;  . ESOPHAGOGASTRODUODENOSCOPY (EGD) WITH PROPOFOL N/A 12/21/2017   Procedure: ESOPHAGOGASTRODUODENOSCOPY (EGD) WITH PROPOFOL;  Surgeon: [name], MD;  Location: [facility];  Service:  Endoscopy;  Laterality: N/A;  . EYE SURGERY    . FOOT SURGERY Left 2005   "fixed bone that stuck out in my ankle area"  . HEMORRHOID BANDING    . IMPLANTABLE CARDIOVERTER DEFIBRILLATOR IMPLANT  09/06/09   BSX dual chamber ICD implanted in [place] for cardiac arrest 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  . INGUINAL HERNIA REPAIR Right ~ 1995  . LEFT HEART CATHETERIZATION WITH CORONARY ANGIOGRAM N/A 11/25/2012   demonstrated EF 30%, inferior akinesis with mild hypokinesis of all walls, patent LAD and RCA stents; ostial PDA with 80-90% obstruction with medical therapy recommended  . MOHS SURGERY  2008   nose, skin graft  .  POLYPECTOMY  12/21/2017   Procedure: POLYPECTOMY;  Surgeon: [name], MD;  Location: [facility];  Service: Endoscopy;;  . RETINAL DETACHMENT SURGERY Right 2013  . TENOLYSIS Right 12/21/2013   Procedure: TENOLYSIS FLEXOR CARPI RADIALIS ,DEBRIDEMENT RIGHT JOINT WRIST,DEBRIDEMENT SCAPHOTRAPEZIAL TRAPEZOID, REPAIR OF EXTENSOR HOOD;  Surgeon: Daryll Brod, MD;  Location: [place];  Service: Orthopedics;  Laterality: Right;  . V-TACH ABLATION  12/21/2014  . VIDEO BRONCHOSCOPY Bilateral 01/09/2016   Procedure: VIDEO BRONCHOSCOPY WITHOUT FLUORO;  Surgeon: Juanito Doom, MD;  Location: [facility];  Service: Cardiopulmonary;  Laterality: Bilateral;    reports that he has been smoking cigarettes. He has a 55.00 pack-year smoking history. He has never used smokeless tobacco. He reports current alcohol use. He reports that he does not use drugs. family history includes CAD in his father and mother; Heart attack in his brother; Hypertension in his brother, father, and mother. Allergies  Allergen Reactions  . Sulfa Antibiotics Hives  . Sulfasalazine Hives   Current Outpatient Medications on File Prior to Visit  Medication Sig Dispense Refill  . albuterol (PROVENTIL) (2.5 MG/3ML) 0.083% nebulizer solution Take 3 mLs (2.5 mg total) by nebulization every 6 (six) hours as needed for wheezing or shortness of breath. 360 mL 5  . amiodarone (PACERONE) 200 MG tablet TAKE 1 TABLET(200 MG) BY MOUTH DAILY 90 tablet 3  . Artificial Tear Solution (SOOTHE XP OP) Place 2 drops into both eyes daily as needed (dry eyes).    [name] aspirin EC 81 MG tablet Take 1 tablet (81 mg total) by mouth every Monday, Wednesday, and Friday. 90 tablet 3  . atorvastatin (LIPITOR) 80 MG tablet TAKE 1 TABLET(80 MG) BY MOUTH DAILY 90 tablet 2  . Baclofen 5 MG TABS Take 5 mg by mouth 3 (three) times daily. 90 tablet 1  . benazepril (LOTENSIN) 10 MG tablet TAKE 1 TABLET BY MOUTH ONCE DAILY 90 tablet 3  .  budesonide-formoterol (SYMBICORT) 160-4.5 MCG/ACT inhaler Inhale 2 puffs into the lungs 2 (two) times daily. 1 Inhaler 3  . buPROPion (WELLBUTRIN) 75 MG tablet TAKE 1 TABLET(75 MG) BY MOUTH DAILY 90 tablet 1  . busPIRone (BUSPAR) 15 MG tablet TAKE 1 TABLET(15 MG) BY MOUTH THREE TIMES DAILY 90 tablet 1  . carvedilol (COREG) 3.125 MG tablet TAKE 1 TABLET(3.125 MG) BY MOUTH TWICE DAILY 180 tablet 0  . cetirizine (ZYRTEC) 10 MG tablet Take 10 mg by mouth daily as needed for allergies.    . Cholecalciferol (VITAMIN D3) 2000 units CHEW Chew 4,000 Units by mouth daily.     . Choline Fenofibrate (FENOFIBRIC ACID) 135 MG CPDR TAKE 1 CAPSULE BY MOUTH DAILY 30 capsule 5  . cyanocobalamin (,VITAMIN B-12,) 1000 MCG/ML injection Inject 1 mL (1,000 mcg total) into the muscle every 14 (fourteen) days. 6 mL  3  . escitalopram (LEXAPRO) 20 MG tablet TAKE 1 TABLET(20 MG) BY MOUTH DAILY 90 tablet 1  . fluticasone (FLONASE) 50 MCG/ACT nasal spray INSTILL 2 SPRAYS INTO EACH NOSTRIL QD    . furosemide (LASIX) 40 MG tablet TAKE 1/2 TABLET(20 MG) BY MOUTH DAILY 45 tablet 2  . Ipratropium-Albuterol (COMBIVENT RESPIMAT) 20-100 MCG/ACT AERS respimat Inhale 1 puff into the lungs every 6 (six) hours. Shortness of breath or wheezing 1 Inhaler 3  . IRON PO Take by mouth.    . Magnesium Oxide 400 MG CAPS Take 1 capsule (400 mg total) by mouth daily. 90 capsule 3  . mexiletine (MEXITIL) 200 MG capsule TAKE 1 CAPSULE(200 MG) BY MOUTH TWICE DAILY 180 capsule 1  . Multiple Vitamins-Minerals (CENTRUM ADULTS PO) Take by mouth daily.    . nitroGLYCERIN (NITROSTAT) 0.4 MG SL tablet DISSOLVE 1 TABLET UNDER THE TONGUE EVERY 5 MINUTES FOR 3 DOSES AS NEEDED FOR CHEST PAIN 25 tablet 6  . Omega-3 Fatty Acids (FISH OIL) 1000 MG CAPS Take 1,000 mg by mouth daily.    [name] omeprazole (PRILOSEC) 20 MG capsule Take 20 mg by mouth daily.     . potassium chloride SA (K-DUR) 20 MEQ tablet TAKE 1 TABLET(20 MEQ) BY MOUTH DAILY 90 tablet 1  . Respiratory  Therapy Supplies (FLUTTER) DEVI Use as directed 1 each 0  . Syringe/Needle, Disp, (SYRINGE 3CC/25GX5/8") 25G X 5/8" 3 ML MISC 1 each by Does not apply route every 14 (fourteen) days. 50 each 0  . tamsulosin (FLOMAX) 0.4 MG CAPS capsule TAKE 1 CAPSULE(0.4 MG) BY MOUTH DAILY AFTER SUPPER 90 capsule 0  . Tiotropium Bromide Monohydrate (SPIRIVA RESPIMAT) 2.5 MCG/ACT AERS Inhale 2 puffs into the lungs daily. 1 Inhaler 5  . XARELTO 20 MG TABS tablet TAKE 1 TABLET BY MOUTH EVERY DAY WITH SUPPER 30 tablet 10   No current facility-administered medications on file prior to visit.    Review of Systems  Constitutional: Negative for other unusual diaphoresis or sweats HENT: Negative for ear discharge or swelling Eyes: Negative for other worsening visual disturbances Respiratory: Negative for stridor or other swelling  Gastrointestinal: Negative for worsening distension or other blood Genitourinary: Negative for retention or other urinary change Musculoskeletal: Negative for other MSK pain or swelling Skin: Negative for color change or other new lesions Neurological: Negative for worsening tremors and other numbness  Psychiatric/Behavioral: Negative for worsening agitation or other fatigue All other system neg per pt    Objective:   Physical Exam BP 116/76   Pulse 69   Temp 98 F (36.7 C) (Oral)   Ht 5\' 11"  (1.803 m)   Wt 188 lb (85.3 kg)   SpO2 94%   BMI 26.22 kg/m  VS noted, non toxic Constitutional: Pt appears in NAD HENT: Head: NCAT.  Right Ear: External ear normal.  Left Ear: External ear normal.  Eyes: . Pupils are equal, round, and reactive to light. Conjunctivae and EOM are normal Nose: without d/c or deformity Neck: Neck supple. Gross normal ROM Cardiovascular: Normal rate and regular rhythm.   Pulmonary/Chest: Effort normal and breath sounds without rales or wheezing.  Abd:  Soft, NT, ND, + BS, no organomegaly, + LIH Neurological: Pt is alert. At baseline orientation, motor  grossly intact Skin: Skin is warm, no LE edema, right leg with 3 cm area skin loss and now shallow ulcerative type lesion to the proximal post leg just distal to the post knee without red, swelling, tender Left perianal area approx  1.5 cm area red, swelling, tender with fluctuance but no drainage Psychiatric: Pt behavior is normal without agitation  No other exam findings Lab Results  Component Value Date   WBC 7.2 09/28/2018   HGB 13.7 09/28/2018   HCT 41.8 09/28/2018   PLT 209.0 09/28/2018   GLUCOSE 69 (L) 09/28/2018   CHOL 157 09/21/2017   TRIG 79 09/21/2017   HDL 46 09/21/2017   LDLCALC 95 09/21/2017   ALT 15 09/28/2018   AST 13 09/28/2018   NA 138 09/28/2018   K 3.6 09/28/2018   CL 102 09/28/2018   CREATININE 0.92 09/28/2018   BUN 11 09/28/2018   CO2 29 09/28/2018   TSH 1.97 09/28/2018   INR 2.47 09/14/2016   HGBA1C 5.6 12/29/2016       Assessment & Plan:

## 2018-11-15 NOTE — Assessment & Plan Note (Signed)
His most urgent issue; Mild to mod, for antibx course, refer to general surgury

## 2018-11-15 NOTE — Assessment & Plan Note (Signed)
stable overall by history and exam, recent data reviewed with pt, and pt to continue medical treatment as before,  to f/u any worsening symptoms or concerns  

## 2018-11-17 DIAGNOSIS — S62397A Other fracture of fifth metacarpal bone, left hand, initial encounter for closed fracture: Secondary | ICD-10-CM | POA: Diagnosis not present

## 2018-11-17 DIAGNOSIS — S81812A Laceration without foreign body, left lower leg, initial encounter: Secondary | ICD-10-CM | POA: Diagnosis not present

## 2018-11-17 DIAGNOSIS — Y939 Activity, unspecified: Secondary | ICD-10-CM | POA: Diagnosis not present

## 2018-11-17 DIAGNOSIS — Z7982 Long term (current) use of aspirin: Secondary | ICD-10-CM | POA: Diagnosis not present

## 2018-11-17 DIAGNOSIS — J449 Chronic obstructive pulmonary disease, unspecified: Secondary | ICD-10-CM | POA: Insufficient documentation

## 2018-11-17 DIAGNOSIS — Y999 Unspecified external cause status: Secondary | ICD-10-CM | POA: Diagnosis not present

## 2018-11-17 DIAGNOSIS — K4090 Unilateral inguinal hernia, without obstruction or gangrene, not specified as recurrent: Secondary | ICD-10-CM | POA: Diagnosis not present

## 2018-11-17 DIAGNOSIS — S2232XA Fracture of one rib, left side, initial encounter for closed fracture: Secondary | ICD-10-CM | POA: Insufficient documentation

## 2018-11-17 DIAGNOSIS — I5022 Chronic systolic (congestive) heart failure: Secondary | ICD-10-CM | POA: Diagnosis not present

## 2018-11-17 DIAGNOSIS — Z85828 Personal history of other malignant neoplasm of skin: Secondary | ICD-10-CM | POA: Diagnosis not present

## 2018-11-17 DIAGNOSIS — N183 Chronic kidney disease, stage 3 (moderate): Secondary | ICD-10-CM | POA: Insufficient documentation

## 2018-11-17 DIAGNOSIS — S81811A Laceration without foreign body, right lower leg, initial encounter: Secondary | ICD-10-CM | POA: Diagnosis not present

## 2018-11-17 DIAGNOSIS — W01190A Fall on same level from slipping, tripping and stumbling with subsequent striking against furniture, initial encounter: Secondary | ICD-10-CM | POA: Insufficient documentation

## 2018-11-17 DIAGNOSIS — F17210 Nicotine dependence, cigarettes, uncomplicated: Secondary | ICD-10-CM | POA: Insufficient documentation

## 2018-11-17 DIAGNOSIS — Z7901 Long term (current) use of anticoagulants: Secondary | ICD-10-CM | POA: Insufficient documentation

## 2018-11-17 DIAGNOSIS — S62522K Displaced fracture of distal phalanx of left thumb, subsequent encounter for fracture with nonunion: Secondary | ICD-10-CM | POA: Diagnosis not present

## 2018-11-17 DIAGNOSIS — S62317A Displaced fracture of base of fifth metacarpal bone. left hand, initial encounter for closed fracture: Secondary | ICD-10-CM | POA: Diagnosis not present

## 2018-11-17 DIAGNOSIS — I130 Hypertensive heart and chronic kidney disease with heart failure and stage 1 through stage 4 chronic kidney disease, or unspecified chronic kidney disease: Secondary | ICD-10-CM | POA: Diagnosis not present

## 2018-11-17 DIAGNOSIS — I2510 Atherosclerotic heart disease of native coronary artery without angina pectoris: Secondary | ICD-10-CM | POA: Insufficient documentation

## 2018-11-17 DIAGNOSIS — S6992XA Unspecified injury of left wrist, hand and finger(s), initial encounter: Secondary | ICD-10-CM | POA: Diagnosis present

## 2018-11-17 DIAGNOSIS — S62522A Displaced fracture of distal phalanx of left thumb, initial encounter for closed fracture: Secondary | ICD-10-CM | POA: Insufficient documentation

## 2018-11-17 DIAGNOSIS — Y929 Unspecified place or not applicable: Secondary | ICD-10-CM | POA: Diagnosis not present

## 2018-11-17 DIAGNOSIS — M25742 Osteophyte, left hand: Secondary | ICD-10-CM | POA: Diagnosis not present

## 2018-11-17 DIAGNOSIS — I252 Old myocardial infarction: Secondary | ICD-10-CM | POA: Diagnosis not present

## 2018-11-17 DIAGNOSIS — K610 Anal abscess: Secondary | ICD-10-CM | POA: Diagnosis not present

## 2018-11-18 ENCOUNTER — Emergency Department (HOSPITAL_BASED_OUTPATIENT_CLINIC_OR_DEPARTMENT_OTHER)
Admission: EM | Admit: 2018-11-18 | Discharge: 2018-11-18 | Disposition: A | Discharge status: Home / Self Care | Payer: Medicare Other | Attending: Emergency Medicine | Admitting: Emergency Medicine

## 2018-11-18 ENCOUNTER — Encounter (HOSPITAL_BASED_OUTPATIENT_CLINIC_OR_DEPARTMENT_OTHER): Payer: Self-pay | Admitting: Emergency Medicine

## 2018-11-18 ENCOUNTER — Emergency Department (HOSPITAL_BASED_OUTPATIENT_CLINIC_OR_DEPARTMENT_OTHER): Payer: Medicare Other

## 2018-11-18 ENCOUNTER — Other Ambulatory Visit (HOSPITAL_BASED_OUTPATIENT_CLINIC_OR_DEPARTMENT_OTHER): Payer: Self-pay | Admitting: Radiology

## 2018-11-18 DIAGNOSIS — S62397A Other fracture of fifth metacarpal bone, left hand, initial encounter for closed fracture: Secondary | ICD-10-CM

## 2018-11-18 DIAGNOSIS — M25742 Osteophyte, left hand: Secondary | ICD-10-CM | POA: Diagnosis not present

## 2018-11-18 DIAGNOSIS — S2232XA Fracture of one rib, left side, initial encounter for closed fracture: Secondary | ICD-10-CM | POA: Diagnosis not present

## 2018-11-18 DIAGNOSIS — S62522K Displaced fracture of distal phalanx of left thumb, subsequent encounter for fracture with nonunion: Secondary | ICD-10-CM

## 2018-11-18 DIAGNOSIS — S62317A Displaced fracture of base of fifth metacarpal bone. left hand, initial encounter for closed fracture: Secondary | ICD-10-CM | POA: Diagnosis not present

## 2018-11-18 DIAGNOSIS — W010XXA Fall on same level from slipping, tripping and stumbling without subsequent striking against object, initial encounter: Secondary | ICD-10-CM

## 2018-11-18 DIAGNOSIS — S81812A Laceration without foreign body, left lower leg, initial encounter: Secondary | ICD-10-CM

## 2018-11-18 MED ORDER — HYDROCODONE-ACETAMINOPHEN 5-325 MG PO TABS
1.0000 | ORAL_TABLET | Freq: Once | ORAL | Status: AC
  Administered 2018-11-18: 1 via ORAL
  Filled 2018-11-18: qty 1

## 2018-11-18 MED ORDER — HYDROCODONE-ACETAMINOPHEN 5-325 MG PO TABS: 1.0000 | ORAL_TABLET | Freq: Four times a day (QID) | ORAL | 0 refills | Status: DC | PRN

## 2018-11-18 NOTE — ED Triage Notes (Signed)
Pt also has skin tear to left calf.

## 2018-11-18 NOTE — ED Triage Notes (Signed)
Pt reports tripping and falling tonight at 2330 against a wooden chair. Pt c/o left axillary rib pain and left hand pain.

## 2018-11-18 NOTE — ED Provider Notes (Addendum)
[facility] Provider Note: Steven Spurling, MD, FACEP  CSN: [id number] MRN: [id number] ARRIVAL: 11/17/18 at [place]: [place]  [place]   HISTORY OF PRESENT ILLNESS  11/18/18 12:42 AM Steven Guzman is a 78 y.o. male tripped and fell yesterday evening about 11:30 PM striking a wooden chair.  He is complaining of pain in his left lateral ribs and left hand.  The pain in the left hand is associated with swelling overlying the fifth metacarpal.  He also has a skin tear to the left calf.  He rates his pain as an 8 out of 10 in the left rib cage.  Pain is worse with palpation or breathing.  He did not lose consciousness.  Tetanus is up-to-date.   Past Medical History:  Diagnosis Date  . AICD (automatic cardioverter/defibrillator) present   . Aneurysm ([place])    a. Aneurysmal infrarenal aorta up to 33 mm on CT 10/2014, recommended f/u due 10/2017  . Anginal pain ([place])   . Anxiety   . Basal cell carcinoma of nose    S/P MOHS  . Biliary acute pancreatitis   . CAD (coronary artery disease)    a. s/p MI in 1994 with PCI to LAD at that time b. cath 10/2012 demonstrated EF 30%, inferior akinesis with mild hypokinesis of all walls, patent LAD and RCA stents; ostial PDA with 80-90% obstruction with medical therapy recommended   . Chronic systolic CHF (congestive heart failure) (HCC)    EF 30 to 35 % as of 09/2014.   [name] CKD (chronic kidney disease), stage III ([place])   . Complication of anesthesia 10/2014   "had to have defibrillator w/ERCP"  . COPD (chronic obstructive pulmonary disease) ([place])    a. followed by pulmonary, COPD GOLD stage II  . Depression   . Diverticulosis of colon 07/2014   noted on CT  . GERD (gastroesophageal reflux disease)   . Hiatal hernia   . Hyperglycemia 10/2012.  [name] Hyperlipidemia   . Hypertension   . Myocardial infarction [facility]) 1994; 2011  . Pneumonia 1946; 2015  . Prostate enlargement 07/2014   observed on CT  . Tobacco abuse   .  Ventricular tachycardia ([place])    a. 08/2009 s/p BSX E110 Teligen 100 AICD, ser#: [id number];  b. 08/2008 VT req ATP - detection reprogrammed from 160 to 150. c. EPS and VT ablation by Dr. Lovena Le 12/21/2014    Past Surgical History:  Procedure Laterality Date  . BIOPSY  12/21/2017   Procedure: BIOPSY;  Surgeon: [name], MD;  Location: [facility];  Service: Endoscopy;;  . CATARACT EXTRACTION W/ INTRAOCULAR LENS  IMPLANT, BILATERAL Bilateral ~ 2011  . COLONOSCOPY    . COLONOSCOPY WITH PROPOFOL N/A 12/21/2017   Procedure: COLONOSCOPY WITH PROPOFOL;  Surgeon: [name], MD;  Location: [facility];  Service: Endoscopy;  Laterality: N/A;  . ELECTROPHYSIOLOGIC STUDY N/A 12/21/2014   Procedure: V Tach Ablation;  Surgeon: [name], MD;  Location: [facility];  Service: Cardiovascular;  Laterality: N/A;  . ERCP N/A 11/16/2014   Procedure: ENDOSCOPIC RETROGRADE CHOLANGIOPANCREATOGRAPHY (ERCP);  Surgeon: [name], MD;  Location: [facility];  Service: Endoscopy;  Laterality: N/A;  . ESOPHAGOGASTRODUODENOSCOPY (EGD) WITH PROPOFOL N/A 12/21/2017   Procedure: ESOPHAGOGASTRODUODENOSCOPY (EGD) WITH PROPOFOL;  Surgeon: [name], MD;  Location: [facility];  Service: Endoscopy;  Laterality: N/A;  . EYE SURGERY    . FOOT SURGERY Left 2005   "fixed  bone that stuck out in my ankle area"  . HEMORRHOID BANDING    . IMPLANTABLE CARDIOVERTER DEFIBRILLATOR IMPLANT  09/06/09   BSX dual chamber ICD implanted in [place] for cardiac arrest 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  . INGUINAL HERNIA REPAIR Right ~ 1995  . LEFT HEART CATHETERIZATION WITH CORONARY ANGIOGRAM N/A 11/25/2012   demonstrated EF 30%, inferior akinesis with mild hypokinesis of all walls, patent LAD and RCA stents; ostial PDA with 80-90% obstruction with medical therapy recommended  . MOHS SURGERY  2008   nose, skin graft  . POLYPECTOMY  12/21/2017   Procedure: POLYPECTOMY;  Surgeon: [name], MD;  Location: [facility];  Service:  Endoscopy;;  . RETINAL DETACHMENT SURGERY Right 2013  . TENOLYSIS Right 12/21/2013   Procedure: TENOLYSIS FLEXOR CARPI RADIALIS ,DEBRIDEMENT RIGHT JOINT WRIST,DEBRIDEMENT SCAPHOTRAPEZIAL TRAPEZOID, REPAIR OF EXTENSOR HOOD;  Surgeon: Daryll Brod, MD;  Location: [place];  Service: Orthopedics;  Laterality: Right;  . V-TACH ABLATION  12/21/2014  . VIDEO BRONCHOSCOPY Bilateral 01/09/2016   Procedure: VIDEO BRONCHOSCOPY WITHOUT FLUORO;  Surgeon: Juanito Doom, MD;  Location: [facility];  Service: Cardiopulmonary;  Laterality: Bilateral;    Family History  Problem Relation Age of Onset  . Heart attack Brother   . CAD Father   . Hypertension Father   . CAD Mother   . Hypertension Mother   . Hypertension Brother   . Stroke Neg Hx     Social History   Tobacco Use  . Smoking status: Current Every Day Smoker    Packs/day: 1.00    Years: 55.00    Pack years: 55.00    Types: Cigarettes    Last attempt to quit: 10/12/2016    Years since quitting: 2.1  . Smokeless tobacco: Never Used  . Tobacco comment: had quit, recently bought a pack  Substance Use Topics  . Alcohol use: Yes    Alcohol/week: 0.0 standard drinks    Comment: 12/21/2014 "might have a couple glasses of wine/year"  . Drug use: No    Prior to Admission medications   Medication Sig Start Date End Date Taking? Authorizing Provider  albuterol (PROVENTIL) (2.5 MG/3ML) 0.083% nebulizer solution Take 3 mLs (2.5 mg total) by nebulization every 6 (six) hours as needed for wheezing or shortness of breath. 06/03/18   Juanito Doom, MD  amiodarone (PACERONE) 200 MG tablet TAKE 1 TABLET(200 MG) BY MOUTH DAILY 06/16/18   Belva Crome, MD  Artificial Tear Solution (SOOTHE XP OP) Place 2 drops into both eyes daily as needed (dry eyes).    [provider]  aspirin EC 81 MG tablet Take 1 tablet (81 mg total) by mouth every Monday, Wednesday, and Friday. 04/25/18   Belva Crome, MD  atorvastatin (LIPITOR) 80  MG tablet TAKE 1 TABLET(80 MG) BY MOUTH DAILY 10/21/18   Burtis Junes, NP  Baclofen 5 MG TABS Take 5 mg by mouth 3 (three) times daily. 06/06/18   Lyndal Pulley, DO  benazepril (LOTENSIN) 10 MG tablet TAKE 1 TABLET BY MOUTH ONCE DAILY 12/28/17   Belva Crome, MD  budesonide-formoterol [facility]) 160-4.5 MCG/ACT inhaler Inhale 2 puffs into the lungs 2 (two) times daily. 11/11/18   Lauraine Rinne, NP  buPROPion (WELLBUTRIN) 75 MG tablet TAKE 1 TABLET(75 MG) BY MOUTH DAILY 09/28/18   Hoyt Koch, MD  busPIRone (BUSPAR) 15 MG tablet TAKE 1 TABLET(15 MG) BY MOUTH THREE TIMES DAILY 10/24/18   Hoyt Koch, MD  carvedilol (COREG) 3.125 MG tablet TAKE 1 TABLET(3.125 MG) BY MOUTH TWICE DAILY 09/05/18   Hoyt Koch, MD  cetirizine (ZYRTEC) 10 MG tablet Take 10 mg by mouth daily as needed for allergies.    [provider]  Cholecalciferol (VITAMIN D3) 2000 units CHEW Chew 4,000 Units by mouth daily.     [provider]  Choline Fenofibrate (FENOFIBRIC ACID) 135 MG CPDR TAKE 1 CAPSULE BY MOUTH DAILY 10/26/18   Belva Crome, MD  cyanocobalamin (,VITAMIN B-12,) 1000 MCG/ML injection Inject 1 mL (1,000 mcg total) into the muscle every 14 (fourteen) days. 08/26/17   Hoyt Koch, MD  doxycycline (VIBRA-TABS) 100 MG tablet Take 1 tablet (100 mg total) by mouth 2 (two) times daily. 11/15/18   Biagio Borg, MD  escitalopram (LEXAPRO) 20 MG tablet TAKE 1 TABLET(20 MG) BY MOUTH DAILY 07/25/18   Hoyt Koch, MD  fluticasone [facility]) 50 MCG/ACT nasal spray INSTILL 2 SPRAYS INTO EACH NOSTRIL QD 04/14/18   [provider]  furosemide (LASIX) 40 MG tablet TAKE 1/2 TABLET(20 MG) BY MOUTH DAILY 06/20/18   Belva Crome, MD  HYDROcodone-acetaminophen (NORCO) 5-325 MG tablet Take 1 tablet by mouth every 6 (six) hours as needed (for pain; may cause constipation). 11/18/18   Alyiah Ulloa, MD  Ipratropium-Albuterol (COMBIVENT RESPIMAT) 20-100 MCG/ACT AERS  respimat Inhale 1 puff into the lungs every 6 (six) hours. Shortness of breath or wheezing 01/28/17   Juanito Doom, MD  IRON PO Take by mouth.    [provider]  Magnesium Oxide 400 MG CAPS Take 1 capsule (400 mg total) by mouth daily. 10/19/17   Hoyt Koch, MD  mexiletine (MEXITIL) 200 MG capsule TAKE 1 CAPSULE(200 MG) BY MOUTH TWICE DAILY 11/09/18   [name], MD  Multiple Vitamins-Minerals (CENTRUM ADULTS PO) Take by mouth daily.    [provider]  nitroGLYCERIN (NITROSTAT) 0.4 MG SL tablet DISSOLVE 1 TABLET UNDER THE TONGUE EVERY 5 MINUTES FOR 3 DOSES AS NEEDED FOR CHEST PAIN 09/26/18   [name], MD  Omega-3 Fatty Acids (FISH OIL) 1000 MG CAPS Take 1,000 mg by mouth daily.    [provider]  omeprazole (PRILOSEC) 20 MG capsule Take 20 mg by mouth daily.     [provider]  potassium chloride SA (K-DUR) 20 MEQ tablet TAKE 1 TABLET(20 MEQ) BY MOUTH DAILY 10/24/18   Hoyt Koch, MD  Respiratory Therapy Supplies (FLUTTER) DEVI Use as directed 03/16/17   Juanito Doom, MD  Syringe/Needle, Disp, (SYRINGE 3CC/25GX5/8") 25G X 5/8" 3 ML MISC 1 each by Does not apply route every 14 (fourteen) days. 08/26/17   Hoyt Koch, MD  tamsulosin (FLOMAX) 0.4 MG CAPS capsule TAKE 1 CAPSULE(0.4 MG) BY MOUTH DAILY AFTER SUPPER 10/26/18   Hoyt Koch, MD  Tiotropium Bromide Monohydrate (SPIRIVA RESPIMAT) 2.5 MCG/ACT AERS Inhale 2 puffs into the lungs daily. 04/25/18   Juanito Doom, MD  XARELTO 20 MG TABS tablet TAKE 1 TABLET BY MOUTH EVERY DAY WITH SUPPER 11/03/18   [name], MD    Allergies Sulfa antibiotics   REVIEW OF SYSTEMS  Negative except as noted here or in the History of Present Illness.   PHYSICAL EXAMINATION  Initial Vital Signs Blood pressure 127/85, pulse 72, temperature 97.8 F (36.6 C), temperature source Oral, resp. rate 16, height 5\' 11"  (1.803 m), weight 83.9 kg, SpO2 97 %.   Examination General: Well-developed, well-nourished male in no acute  distress; appearance consistent with age of record HENT: normocephalic; atraumatic Eyes: pupils equal, round and reactive to light; extraocular muscles intact Neck: supple; nontender Heart: regular rate and rhythm Lungs: clear to auscultation bilaterally Chest: Left rib point tenderness at mid axillary line Abdomen: soft; nondistended; nontender; bowel sounds present Extremities: No deformity; ecchymosis, swelling and tenderness overlying left distal fifth metacarpal with decreased range of motion of the left fifth finger, finger distally neurovascularly intact; no tenderness of left thumb Neurologic: Awake, alert and oriented; motor function intact in all extremities and symmetric; no facial droop Skin: Warm and dry; multiple senile purpura; multiple skin tears of various ages including fresh skin tear of left calf Psychiatric: Normal mood and affect   RESULTS  Summary of this visit's results, reviewed by myself:   EKG Interpretation  Date/Time:    Ventricular Rate:    PR Interval:    QRS Duration:   QT Interval:    QTC Calculation:   R Axis:     Text Interpretation:        Laboratory Studies: No results found for this or any previous visit (from the past 24 hour(s)). Imaging Studies: Dg Ribs Unilateral W/chest Left  Result Date: 11/18/2018 CLINICAL DATA:  Fall with left rib pain. EXAM: LEFT RIBS AND CHEST - 3+ VIEW COMPARISON:  Chest radiograph 04/28/2018 FINDINGS: Minimally displaced fracture of left lateral eighth rib. No pneumothorax or evidence pulmonary contusion. No pleural fluid. Left-sided pacemaker in place. Normal heart size with aortic tortuosity, unchanged from prior exam. Minimal subsegmental left lung base atelectasis. Right lung is clear. Small hiatal hernia. IMPRESSION: Acute minimally displaced left lateral eighth rib fracture. No pneumothorax or pulmonary contusion. Electronically Signed    By: Keith Rake M.D.   On: 11/18/2018 01:14   Dg Hand Complete Left  Result Date: 11/18/2018 CLINICAL DATA:  Fall, pain and swelling of the distal fifth metacarpal EXAM: LEFT HAND - COMPLETE 3+ VIEW COMPARISON:  None. FINDINGS: Minimally displaced fracture involving the head of the fifth metacarpal. No intra-articular extension. Overlying swelling is present. Age-indeterminate fractured osteophyte at the base of the first distal phalanx. No other fracture or traumatic malalignment is evident. Mild diffuse interphalangeal arthrosis is noted as is some spurring at the first carpometacarpal and triscaphe joints. IMPRESSION: 1. Minimally displaced fracture involving the head of the fifth metacarpal. No intra-articular extension. 2. Age-indeterminate fractured osteophyte at the base of the first distal phalanx. Correlate with point tenderness. Electronically Signed   By: Lovena Le M.D.   On: 11/18/2018 01:12    ED COURSE and MDM  Nursing notes and initial vitals signs, including pulse oximetry, reviewed.  Vitals:   11/18/18 0006 11/18/18 0007  BP: 127/85   Pulse: 72   Resp: 16   Temp: 97.8 F (36.6 C)   TempSrc: Oral   SpO2: 97%   Weight:  83.9 kg  Height:  5\' 11"  (1.803 m)   Radiographic findings correlate well with the patient's acute pain and tenderness.  His thumb is not tender so this is likely an old injury.  We will place the patient's left forearm in an ulnar gutter splint and refer to hand surgery.  Will provide an incentive spirometer to keep his lungs open and treat his pain.   PROCEDURES    ED DIAGNOSES     ICD-10-CM   1. Fall from slip, trip, or stumble, initial encounter  W01.0XXA   2. Closed displaced fracture of other part of fifth metacarpal bone of left hand,  initial encounter  S62.397A   3. Closed fracture of one rib of left side, initial encounter  S22.32XA   4. Skin tear of left lower leg without complication, initial encounter  S81.812A   5. Closed fracture  of base of distal phalanx of left thumb with nonunion  [id number]        Pate Aylward, MD 11/18/18 0129    Shanon Rosser, MD 11/18/18 [phone]

## 2018-11-22 ENCOUNTER — Ambulatory Visit (INDEPENDENT_AMBULATORY_CARE_PROVIDER_SITE_OTHER): Payer: Medicare Other | Admitting: Internal Medicine

## 2018-11-22 ENCOUNTER — Encounter: Payer: Self-pay | Admitting: Internal Medicine

## 2018-11-22 ENCOUNTER — Other Ambulatory Visit: Payer: Self-pay

## 2018-11-23 ENCOUNTER — Other Ambulatory Visit: Payer: Self-pay | Admitting: Internal Medicine

## 2018-11-24 DIAGNOSIS — S62397A Other fracture of fifth metacarpal bone, left hand, initial encounter for closed fracture: Secondary | ICD-10-CM | POA: Diagnosis not present

## 2018-11-26 ENCOUNTER — Emergency Department (HOSPITAL_BASED_OUTPATIENT_CLINIC_OR_DEPARTMENT_OTHER)
Admission: EM | Admit: 2018-11-26 | Discharge: 2018-11-26 | Disposition: A | Discharge status: Home / Self Care | Payer: Medicare Other | Attending: Emergency Medicine | Admitting: Emergency Medicine

## 2018-11-26 ENCOUNTER — Encounter (HOSPITAL_BASED_OUTPATIENT_CLINIC_OR_DEPARTMENT_OTHER): Payer: Self-pay | Admitting: *Deleted

## 2018-11-26 ENCOUNTER — Other Ambulatory Visit: Payer: Self-pay

## 2018-11-26 DIAGNOSIS — I5022 Chronic systolic (congestive) heart failure: Secondary | ICD-10-CM | POA: Diagnosis not present

## 2018-11-26 DIAGNOSIS — R1032 Left lower quadrant pain: Secondary | ICD-10-CM | POA: Diagnosis present

## 2018-11-26 DIAGNOSIS — I252 Old myocardial infarction: Secondary | ICD-10-CM | POA: Insufficient documentation

## 2018-11-26 DIAGNOSIS — Z95810 Presence of automatic (implantable) cardiac defibrillator: Secondary | ICD-10-CM | POA: Diagnosis not present

## 2018-11-26 DIAGNOSIS — I130 Hypertensive heart and chronic kidney disease with heart failure and stage 1 through stage 4 chronic kidney disease, or unspecified chronic kidney disease: Secondary | ICD-10-CM | POA: Insufficient documentation

## 2018-11-26 DIAGNOSIS — Z7982 Long term (current) use of aspirin: Secondary | ICD-10-CM | POA: Insufficient documentation

## 2018-11-26 DIAGNOSIS — J449 Chronic obstructive pulmonary disease, unspecified: Secondary | ICD-10-CM | POA: Diagnosis not present

## 2018-11-26 DIAGNOSIS — E785 Hyperlipidemia, unspecified: Secondary | ICD-10-CM | POA: Insufficient documentation

## 2018-11-26 DIAGNOSIS — Z79899 Other long term (current) drug therapy: Secondary | ICD-10-CM | POA: Diagnosis not present

## 2018-11-26 DIAGNOSIS — N183 Chronic kidney disease, stage 3 (moderate): Secondary | ICD-10-CM | POA: Diagnosis not present

## 2018-11-26 DIAGNOSIS — K4090 Unilateral inguinal hernia, without obstruction or gangrene, not specified as recurrent: Secondary | ICD-10-CM

## 2018-11-26 DIAGNOSIS — F17210 Nicotine dependence, cigarettes, uncomplicated: Secondary | ICD-10-CM | POA: Insufficient documentation

## 2018-11-26 DIAGNOSIS — I2510 Atherosclerotic heart disease of native coronary artery without angina pectoris: Secondary | ICD-10-CM | POA: Diagnosis not present

## 2018-11-26 NOTE — ED Provider Notes (Signed)
[facility] EMERGENCY DEPARTMENT Provider Note   CSN: [id number] Arrival date & time: 11/26/18  2134     History   Chief Complaint Chief Complaint  Patient presents with  . Groin Pain    HPI Steven Guzman is a 78 y.o. male.     The history is provided by the patient and the spouse.  Groin Pain This is a new problem. The current episode started yesterday. The problem occurs constantly. The problem has been gradually worsening. Associated symptoms include abdominal pain. The symptoms are aggravated by coughing. The symptoms are relieved by rest.   Patient with multiple medical conditions presents with left groin pain.  He reports he has had a hernia in that region for over 3 weeks. He was recently seen by PCP who felt that was worsening, referred to general surgery later this week Over the past 24 hours she felt that the hernia is worsening.  He reports of some difficulty urinating but may be due to the prostate.  No fevers or vomiting.  He is passing gas and stool.  Past Medical History:  Diagnosis Date  . AICD (automatic cardioverter/defibrillator) present   . Aneurysm ([place])    a. Aneurysmal infrarenal aorta up to 33 mm on CT 10/2014, recommended f/u due 10/2017  . Anginal pain ([place])   . Anxiety   . Basal cell carcinoma of nose    S/P MOHS  . Biliary acute pancreatitis   . CAD (coronary artery disease)    a. s/p MI in 1994 with PCI to LAD at that time b. cath 10/2012 demonstrated EF 30%, inferior akinesis with mild hypokinesis of all walls, patent LAD and RCA stents; ostial PDA with 80-90% obstruction with medical therapy recommended   . Chronic systolic CHF (congestive heart failure) (HCC)    EF 30 to 35 % as of 09/2014.   [name] CKD (chronic kidney disease), stage III ([place])   . Complication of anesthesia 10/2014   "had to have defibrillator w/ERCP"  . COPD (chronic obstructive pulmonary disease) ([place])    a. followed by pulmonary, COPD GOLD stage II  . Depression    . Diverticulosis of colon 07/2014   noted on CT  . GERD (gastroesophageal reflux disease)   . Hiatal hernia   . Hyperglycemia 10/2012.  [name] Hyperlipidemia   . Hypertension   . Myocardial infarction [facility]) 1994; 2011  . Pneumonia 1946; 2015  . Prostate enlargement 07/2014   observed on CT  . Tobacco abuse   . Ventricular tachycardia ([place])    a. 08/2009 s/p BSX E110 Teligen 100 AICD, ser#: [id number];  b. 08/2008 VT req ATP - detection reprogrammed from 160 to 150. c. EPS and VT ablation by Dr. Lovena Le 12/21/2014    Patient Active Problem List   Diagnosis Date Noted  . Perianal abscess 11/15/2018  . Left inguinal hernia 11/15/2018  . Leg wound, right 11/15/2018  . Weight loss 09/29/2018  . Dark stools 09/29/2018  . Lumbar trigger point syndrome 04/20/2018  . Throat and mouth symptom 02/03/2018  . Gastroesophageal reflux disease without esophagitis   . Esophageal stricture   . Gastritis and gastroduodenitis   . Degenerative cervical spinal stenosis 10/27/2017  . Carotid bruit 10/27/2017  . B12 deficiency 08/25/2017  . Anemia 06/10/2017  . Right ankle pain 04/09/2017  . BPH (benign prostatic hyperplasia) 12/31/2016  . Dizzinesses 05/22/2016  . Pancreatitis, chronic ([place]) 08/02/2015  . Mass of throat 06/30/2015  . Depression 05/24/2015  . Atrial fibrillation (  [place]) 03/15/2015  . Routine general medical examination at a health care facility 05/12/2014  . Hemoptysis 03/16/2014  . COPD GOLD GRADE C 12/25/2013  . Chronic systolic heart failure ([place]) 07/07/2013  . CAD (coronary artery disease) 11/23/2012  . Smokers' cough ([place]) 11/23/2012  . Ventricular tachycardia ([place]) 11/23/2012  . Essential hypertension 11/23/2012  . Hyperlipidemia 11/23/2012  . ICD (implantable cardioverter-defibrillator) in place 11/23/2012    Past Surgical History:  Procedure Laterality Date  . BIOPSY  12/21/2017   Procedure: BIOPSY;  Surgeon: [name], MD;  Location: [facility];  Service: Endoscopy;;  .  CATARACT EXTRACTION W/ INTRAOCULAR LENS  IMPLANT, BILATERAL Bilateral ~ 2011  . COLONOSCOPY    . COLONOSCOPY WITH PROPOFOL N/A 12/21/2017   Procedure: COLONOSCOPY WITH PROPOFOL;  Surgeon: [name], MD;  Location: [facility];  Service: Endoscopy;  Laterality: N/A;  . ELECTROPHYSIOLOGIC STUDY N/A 12/21/2014   Procedure: V Tach Ablation;  Surgeon: [name], MD;  Location: [facility] CV LAB;  Service: Cardiovascular;  Laterality: N/A;  . ERCP N/A 11/16/2014   Procedure: ENDOSCOPIC RETROGRADE CHOLANGIOPANCREATOGRAPHY (ERCP);  Surgeon: [name], MD;  Location: [facility];  Service: Endoscopy;  Laterality: N/A;  . ESOPHAGOGASTRODUODENOSCOPY (EGD) WITH PROPOFOL N/A 12/21/2017   Procedure: ESOPHAGOGASTRODUODENOSCOPY (EGD) WITH PROPOFOL;  Surgeon: [name], MD;  Location: [facility];  Service: Endoscopy;  Laterality: N/A;  . EYE SURGERY    . FOOT SURGERY Left 2005   "fixed bone that stuck out in my ankle area"  . HEMORRHOID BANDING    . IMPLANTABLE CARDIOVERTER DEFIBRILLATOR IMPLANT  09/06/09   BSX dual chamber ICD implanted in [place] for cardiac arrest 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  . INGUINAL HERNIA REPAIR Right ~ 1995  . LEFT HEART CATHETERIZATION WITH CORONARY ANGIOGRAM N/A 11/25/2012   demonstrated EF 30%, inferior akinesis with mild hypokinesis of all walls, patent LAD and RCA stents; ostial PDA with 80-90% obstruction with medical therapy recommended  . MOHS SURGERY  2008   nose, skin graft  . POLYPECTOMY  12/21/2017   Procedure: POLYPECTOMY;  Surgeon: [name], MD;  Location: [facility];  Service: Endoscopy;;  . RETINAL DETACHMENT SURGERY Right 2013  . TENOLYSIS Right 12/21/2013   Procedure: TENOLYSIS FLEXOR CARPI RADIALIS ,DEBRIDEMENT RIGHT JOINT WRIST,DEBRIDEMENT SCAPHOTRAPEZIAL TRAPEZOID, REPAIR OF EXTENSOR HOOD;  Surgeon: Daryll Brod, MD;  Location: [facility];  Service: Orthopedics;  Laterality: Right;  . V-TACH ABLATION  12/21/2014  . VIDEO  BRONCHOSCOPY Bilateral 01/09/2016   Procedure: VIDEO BRONCHOSCOPY WITHOUT FLUORO;  Surgeon: Juanito Doom, MD;  Location: [facility];  Service: Cardiopulmonary;  Laterality: Bilateral;        Home Medications    Prior to Admission medications   Medication Sig Start Date End Date Taking? Authorizing Provider  albuterol (PROVENTIL) (2.5 MG/3ML) 0.083% nebulizer solution Take 3 mLs (2.5 mg total) by nebulization every 6 (six) hours as needed for wheezing or shortness of breath. 06/03/18   Juanito Doom, MD  amiodarone (PACERONE) 200 MG tablet TAKE 1 TABLET(200 MG) BY MOUTH DAILY 06/16/18   Belva Crome, MD  Artificial Tear Solution (SOOTHE XP OP) Place 2 drops into both eyes daily as needed (dry eyes).    [provider]  aspirin EC 81 MG tablet Take 1 tablet (81 mg total) by mouth every Monday, Wednesday, and Friday. 04/25/18   Belva Crome, MD  atorvastatin (LIPITOR) 80 MG tablet TAKE 1 TABLET(80 MG) BY MOUTH DAILY 10/21/18  Burtis Junes, NP  Baclofen 5 MG TABS Take 5 mg by mouth 3 (three) times daily. 06/06/18   Lyndal Pulley, DO  benazepril (LOTENSIN) 10 MG tablet TAKE 1 TABLET BY MOUTH ONCE DAILY 12/28/17   Belva Crome, MD  budesonide-formoterol [facility]) 160-4.5 MCG/ACT inhaler Inhale 2 puffs into the lungs 2 (two) times daily. 11/11/18   Lauraine Rinne, NP  buPROPion (WELLBUTRIN) 75 MG tablet TAKE 1 TABLET(75 MG) BY MOUTH DAILY 09/28/18   Hoyt Koch, MD  busPIRone (BUSPAR) 15 MG tablet TAKE 1 TABLET(15 MG) BY MOUTH THREE TIMES DAILY 10/24/18   Hoyt Koch, MD  carvedilol (COREG) 3.125 MG tablet TAKE 1 TABLET(3.125 MG) BY MOUTH TWICE DAILY 09/05/18   Hoyt Koch, MD  cetirizine (ZYRTEC) 10 MG tablet Take 10 mg by mouth daily as needed for allergies.    [provider]  Cholecalciferol (VITAMIN D3) 2000 units CHEW Chew 4,000 Units by mouth daily.     [provider]  Choline Fenofibrate (FENOFIBRIC ACID) 135 MG CPDR TAKE  1 CAPSULE BY MOUTH DAILY 10/26/18   Belva Crome, MD  cyanocobalamin (,VITAMIN B-12,) 1000 MCG/ML injection Inject 1 mL (1,000 mcg total) into the muscle every 14 (fourteen) days. 08/26/17   Hoyt Koch, MD  doxycycline (VIBRA-TABS) 100 MG tablet Take 1 tablet (100 mg total) by mouth 2 (two) times daily. 11/15/18   Biagio Borg, MD  escitalopram (LEXAPRO) 20 MG tablet TAKE 1 TABLET(20 MG) BY MOUTH DAILY 07/25/18   Hoyt Koch, MD  fluticasone [facility]) 50 MCG/ACT nasal spray INSTILL 2 SPRAYS INTO EACH NOSTRIL QD 04/14/18   [provider]  furosemide (LASIX) 40 MG tablet TAKE 1/2 TABLET(20 MG) BY MOUTH DAILY 06/20/18   Belva Crome, MD  HYDROcodone-acetaminophen (NORCO) 5-325 MG tablet Take 1 tablet by mouth every 6 (six) hours as needed (for pain; may cause constipation). 11/18/18   Molpus, John, MD  Ipratropium-Albuterol (COMBIVENT RESPIMAT) 20-100 MCG/ACT AERS respimat Inhale 1 puff into the lungs every 6 (six) hours. Shortness of breath or wheezing 01/28/17   Juanito Doom, MD  IRON PO Take by mouth.    [provider]  MAGNESIUM-OXIDE 400 (241.3 Mg) MG tablet TAKE 1 TABSULE BY MOUTH DAILY 11/24/18   Hoyt Koch, MD  mexiletine (MEXITIL) 200 MG capsule TAKE 1 CAPSULE(200 MG) BY MOUTH TWICE DAILY 11/09/18   [name], MD  Multiple Vitamins-Minerals (CENTRUM ADULTS PO) Take by mouth daily.    [provider]  nitroGLYCERIN (NITROSTAT) 0.4 MG SL tablet DISSOLVE 1 TABLET UNDER THE TONGUE EVERY 5 MINUTES FOR 3 DOSES AS NEEDED FOR CHEST PAIN 09/26/18   [name], MD  Omega-3 Fatty Acids (FISH OIL) 1000 MG CAPS Take 1,000 mg by mouth daily.    [provider]  omeprazole (PRILOSEC) 20 MG capsule Take 20 mg by mouth daily.     [provider]  potassium chloride SA (K-DUR) 20 MEQ tablet TAKE 1 TABLET(20 MEQ) BY MOUTH DAILY 10/24/18   Hoyt Koch, MD  Respiratory Therapy Supplies (FLUTTER) DEVI Use as directed  03/16/17   Juanito Doom, MD  Syringe/Needle, Disp, (SYRINGE 3CC/25GX5/8") 25G X 5/8" 3 ML MISC 1 each by Does not apply route every 14 (fourteen) days. 08/26/17   Hoyt Koch, MD  tamsulosin (FLOMAX) 0.4 MG CAPS capsule TAKE 1 CAPSULE(0.4 MG) BY MOUTH DAILY AFTER SUPPER 10/26/18   Hoyt Koch, MD  Tiotropium Bromide Monohydrate (  SPIRIVA RESPIMAT) 2.5 MCG/ACT AERS Inhale 2 puffs into the lungs daily. 04/25/18   Juanito Doom, MD  XARELTO 20 MG TABS tablet TAKE 1 TABLET BY MOUTH EVERY DAY WITH SUPPER 11/03/18   [name], MD    Family History Family History  Problem Relation Age of Onset  . Heart attack Brother   . CAD Father   . Hypertension Father   . CAD Mother   . Hypertension Mother   . Hypertension Brother   . Stroke Neg Hx     Social History Social History   Tobacco Use  . Smoking status: Current Every Day Smoker    Packs/day: 1.00    Years: 55.00    Pack years: 55.00    Types: Cigarettes    Last attempt to quit: 10/12/2016    Years since quitting: 2.1  . Smokeless tobacco: Never Used  . Tobacco comment: had quit, recently bought a pack  Substance Use Topics  . Alcohol use: Yes    Alcohol/week: 0.0 standard drinks    Comment: 12/21/2014 "might have a couple glasses of wine/year"  . Drug use: No     Allergies   Sulfa antibiotics   Review of Systems Review of Systems  Constitutional: Negative for fever.  Gastrointestinal: Positive for abdominal pain and nausea. Negative for vomiting.  Genitourinary: Positive for difficulty urinating. Negative for dysuria.  All other systems reviewed and are negative.    Physical Exam Updated Vital Signs BP 132/78 (BP Location: Left Arm)   Pulse 64   Temp 98.4 F (36.9 C) (Oral)   Resp 18   Ht 1.803 m (5\' 11" )   Wt 85.3 kg   SpO2 98%   BMI 26.22 kg/m   Physical Exam  CONSTITUTIONAL: Elderly, no acute distress HEAD: Normocephalic/atraumatic EYES: EOMI NECK: supple no meningeal signs  SPINE/BACK:entire spine nontender CV: S1/S2 noted LUNGS: Lungs are clear to auscultation bilaterally, no apparent distress ABDOMEN: soft, nontender, no rebound or guarding, bowel sounds noted throughout abdomen GU:no cva tenderness, small left inguinal hernia noted.  No erythema or warmth noted.  No tenderness noted.  No pulsatile mass.  No scrotal tenderness.  The hernia does not extend into the scrotal sac.  No tenderness to palpation peritoneum.  chaperone present for exam NEURO: Pt is awake/alert/appropriate, moves all extremitiesx4.  No facial droop.   EXTREMITIES: pulses normal/equal, full ROM SKIN: warm, color normal PSYCH: Anxious   ED Treatments / Results  Labs (all labs ordered are listed, but only abnormal results are displayed) Labs Reviewed - No data to display  EKG None  Radiology No results found.  Procedures Procedures  Medications Ordered in ED Medications - No data to display   Initial Impression / Assessment and Plan / ED Course  I have reviewed the triage vital signs and the nursing notes.         Patient here with left inguinal hernia.  He has had this present for over 3 weeks. He reports it is actually improving.  Similar to parents 3 weeks ago.  No vomiting.  He is passing gas and stool.  No signs of strangulation. At this point I do not feel further work-up or imaging is warranted. Advised need to limit straining, no lifting, he must use stool softeners.  Follow-up with surgery later this week  Final Clinical Impressions(s) / ED Diagnoses   Final diagnoses:  Left inguinal hernia    ED Discharge Orders    None  Ripley Fraise, MD 11/26/18 [phone]

## 2018-11-26 NOTE — ED Triage Notes (Signed)
Pt reports left inguinal hernia x 3 weeks. States he is having pelvic pain, scrotal pain, difficulty urinating. Last BM was this morning

## 2018-11-30 DIAGNOSIS — K4090 Unilateral inguinal hernia, without obstruction or gangrene, not specified as recurrent: Secondary | ICD-10-CM | POA: Diagnosis not present

## 2018-12-14 ENCOUNTER — Encounter (HOSPITAL_BASED_OUTPATIENT_CLINIC_OR_DEPARTMENT_OTHER): Payer: Self-pay | Admitting: Emergency Medicine

## 2018-12-14 ENCOUNTER — Ambulatory Visit (INDEPENDENT_AMBULATORY_CARE_PROVIDER_SITE_OTHER): Payer: Medicare Other | Admitting: Nurse Practitioner

## 2018-12-14 ENCOUNTER — Encounter: Payer: Self-pay | Admitting: Nurse Practitioner

## 2018-12-14 ENCOUNTER — Other Ambulatory Visit: Payer: Self-pay

## 2018-12-14 VITALS — BP 100/68 | HR 73 | Temp 97.0°F | Ht 71.0 in | Wt 183.6 lb

## 2018-12-14 DIAGNOSIS — F17200 Nicotine dependence, unspecified, uncomplicated: Secondary | ICD-10-CM

## 2018-12-14 DIAGNOSIS — N183 Chronic kidney disease, stage 3 (moderate): Secondary | ICD-10-CM | POA: Diagnosis not present

## 2018-12-14 DIAGNOSIS — I2510 Atherosclerotic heart disease of native coronary artery without angina pectoris: Secondary | ICD-10-CM | POA: Diagnosis not present

## 2018-12-14 DIAGNOSIS — Z7982 Long term (current) use of aspirin: Secondary | ICD-10-CM | POA: Diagnosis not present

## 2018-12-14 DIAGNOSIS — N400 Enlarged prostate without lower urinary tract symptoms: Secondary | ICD-10-CM

## 2018-12-14 DIAGNOSIS — E7849 Other hyperlipidemia: Secondary | ICD-10-CM

## 2018-12-14 DIAGNOSIS — E538 Deficiency of other specified B group vitamins: Secondary | ICD-10-CM | POA: Diagnosis not present

## 2018-12-14 DIAGNOSIS — I5022 Chronic systolic (congestive) heart failure: Secondary | ICD-10-CM | POA: Diagnosis not present

## 2018-12-14 DIAGNOSIS — Z23 Encounter for immunization: Secondary | ICD-10-CM

## 2018-12-14 DIAGNOSIS — J449 Chronic obstructive pulmonary disease, unspecified: Secondary | ICD-10-CM

## 2018-12-14 DIAGNOSIS — I130 Hypertensive heart and chronic kidney disease with heart failure and stage 1 through stage 4 chronic kidney disease, or unspecified chronic kidney disease: Secondary | ICD-10-CM | POA: Diagnosis not present

## 2018-12-14 DIAGNOSIS — F17210 Nicotine dependence, cigarettes, uncomplicated: Secondary | ICD-10-CM | POA: Diagnosis not present

## 2018-12-14 DIAGNOSIS — D649 Anemia, unspecified: Secondary | ICD-10-CM | POA: Diagnosis not present

## 2018-12-14 DIAGNOSIS — I480 Paroxysmal atrial fibrillation: Secondary | ICD-10-CM

## 2018-12-14 DIAGNOSIS — I255 Ischemic cardiomyopathy: Secondary | ICD-10-CM

## 2018-12-14 DIAGNOSIS — K5900 Constipation, unspecified: Secondary | ICD-10-CM

## 2018-12-14 DIAGNOSIS — Z79899 Other long term (current) drug therapy: Secondary | ICD-10-CM | POA: Insufficient documentation

## 2018-12-14 DIAGNOSIS — I252 Old myocardial infarction: Secondary | ICD-10-CM | POA: Diagnosis not present

## 2018-12-14 DIAGNOSIS — K219 Gastro-esophageal reflux disease without esophagitis: Secondary | ICD-10-CM | POA: Diagnosis not present

## 2018-12-14 DIAGNOSIS — I10 Essential (primary) hypertension: Secondary | ICD-10-CM

## 2018-12-14 DIAGNOSIS — R042 Hemoptysis: Secondary | ICD-10-CM | POA: Diagnosis not present

## 2018-12-14 DIAGNOSIS — Z7901 Long term (current) use of anticoagulants: Secondary | ICD-10-CM | POA: Insufficient documentation

## 2018-12-14 NOTE — Progress Notes (Signed)
Careteam: Patient Care Team: Hoyt Koch, MD as PCP - General (Internal Medicine) Belva Crome, MD as PCP - Cardiology (Cardiology) [name], MD as PCP - Electrophysiology (Cardiology)  Advanced Directive information    Allergies  Allergen Reactions  . Sulfa Antibiotics Hives    Chief Complaint  Patient presents with  . Establish Care    New patient visit  . Immunizations    Flu immunization given today     HPI: Patient is a 78 y.o. male seen in the office today to establish care. Was drawn to our practice due to senior care.   COPD- taking albuterol and combivent as needed, using symbicort 2 puffs twice daily and spiriva. Following with pulmonary, feels like breathing is well controlled on medications  Ventricular tachycardia- has 2 cardiologist, sees EP Dr Lovena Le, has defibrillator; continues on amiodarone, mexitil BID States he had a cardiac arrest in the hospital after gallstone being removed.   Hyperlipidemia- Lipitor 80 mg daily  With fenobric acid 135 mg daily  Dry eyes- maintained on artifical tears, follows with ophthalmologist in [place].   CAD- continues on ASA 81 mg, lipitor, coreg and nitroglycerin  GERD- controlled on omeprazole 20 mg by daily   Depression/anxiety- in remission with Wellbutrin, lexapro 20 mg daily and buspar twice daily. Had anxiety for a long time but more depressed after MI.    Htn/chf- taking benazepril 10 mg daily, coreg 3.125 mg by mouth twice daily, lasix 20 mg daily with mag and potassium supplement  Vit b12 def- wife gives him injections also take liquid b12 daily.   Season allergies- 2 sprays into each nostril daily controlled on this. Follows up with ENT   Iron def anemia- taking iron daily   Constipation- started senna 8.6 mg by mouth daily due to hernia (has increase in discomfort when any constipation occurs)  A fib- rate controlled. On xarelto for anticoagulation.   bph- using flomax 0.4 mg by  mouth daily, has increase frequency in urination.  Does not follow with urologist   Hx of squamous cell carcinoma- following with dermatologist, had 4 removed recently  Tobacco abuse- smoking at least 1 ppd.  Stopped smoking for 6 months but restarted and now up to 1 ppd.   Low back pain- followed by orthopedics per follow up in July  He has significant decreased ossific density on his x-rays which is concerning for compression fracture in his spine. The fact that he has significant 30 pound weight loss over the last several months is also concerning for possible malignancy. He cannot have MRI and for that reason we will schedule him for bone density testing to evaluate his baseline bone density and nuclear medicine scan and scan to evaluate for bony metastases versus occult compression fracture lumbar spine. Bone scan noted scattered degenerative type changes.   Osteoporosis noted from follow up with orthopedist- recently went for Bone Scan- needs to follow up with orthopedic.   Review of Systems:  Review of Systems  Constitutional: Positive for weight loss (only eating twice daily). Negative for chills and fever.  HENT: Negative for tinnitus.   Respiratory: Positive for cough. Negative for sputum production and shortness of breath.   Cardiovascular: Positive for chest pain and palpitations. Negative for leg swelling.  Gastrointestinal: Negative for abdominal pain, constipation, diarrhea and heartburn.       Left side HH  Genitourinary: Positive for frequency. Negative for dysuria and urgency.  Musculoskeletal: Positive for back pain  and myalgias. Negative for falls and joint pain.  Skin: Negative.   Neurological: Positive for dizziness. Negative for headaches.  Psychiatric/Behavioral: Positive for depression. Negative for memory loss. The patient is nervous/anxious. The patient does not have insomnia.        Anxiety and depression in remission on current regimen    Past Medical History:   Diagnosis Date  . AICD (automatic cardioverter/defibrillator) present   . Aneurysm ([place])    a. Aneurysmal infrarenal aorta up to 33 mm on CT 10/2014, recommended f/u due 10/2017  . Anginal pain ([place])   . Anxiety   . Basal cell carcinoma of nose    S/P MOHS  . Biliary acute pancreatitis   . CAD (coronary artery disease)    a. s/p MI in 1994 with PCI to LAD at that time b. cath 10/2012 demonstrated EF 30%, inferior akinesis with mild hypokinesis of all walls, patent LAD and RCA stents; ostial PDA with 80-90% obstruction with medical therapy recommended   . Chronic systolic CHF (congestive heart failure) (HCC)    EF 30 to 35 % as of 09/2014.   [name] CKD (chronic kidney disease), stage III ([place])   . Complication of anesthesia 10/2014   "had to have defibrillator w/ERCP"  . COPD (chronic obstructive pulmonary disease) ([place])    a. followed by pulmonary, COPD GOLD stage II  . Depression   . Diverticulosis of colon 07/2014   noted on CT  . GERD (gastroesophageal reflux disease)   . Hiatal hernia   . Hyperglycemia 10/2012.  [name] Hyperlipidemia   . Hypertension   . Myocardial infarction [facility]) 1994; 2011  . Pneumonia 1946; 2015  . Prostate enlargement 07/2014   observed on CT  . Tobacco abuse   . Ventricular tachycardia ([place])    a. 08/2009 s/p BSX E110 Teligen 100 AICD, ser#: [id number];  b. 08/2008 VT req ATP - detection reprogrammed from 160 to 150. c. EPS and VT ablation by Dr. Lovena Le 12/21/2014   Past Surgical History:  Procedure Laterality Date  . BIOPSY  12/21/2017   Procedure: BIOPSY;  Surgeon: [name], MD;  Location: [facility];  Service: Endoscopy;;  . CATARACT EXTRACTION W/ INTRAOCULAR LENS  IMPLANT, BILATERAL Bilateral ~ 2011  . COLONOSCOPY    . COLONOSCOPY WITH PROPOFOL N/A 12/21/2017   Procedure: COLONOSCOPY WITH PROPOFOL;  Surgeon: [name], MD;  Location: [facility];  Service: Endoscopy;  Laterality: N/A;  . ELECTROPHYSIOLOGIC STUDY N/A 12/21/2014   Procedure: V Tach Ablation;   Surgeon: [name], MD;  Location: [facility];  Service: Cardiovascular;  Laterality: N/A;  . ERCP N/A 11/16/2014   Procedure: ENDOSCOPIC RETROGRADE CHOLANGIOPANCREATOGRAPHY (ERCP);  Surgeon: [name], MD;  Location: [facility];  Service: Endoscopy;  Laterality: N/A;  . ESOPHAGOGASTRODUODENOSCOPY (EGD) WITH PROPOFOL N/A 12/21/2017   Procedure: ESOPHAGOGASTRODUODENOSCOPY (EGD) WITH PROPOFOL;  Surgeon: [name], MD;  Location: [facility];  Service: Endoscopy;  Laterality: N/A;  . EYE SURGERY    . FOOT SURGERY Left 2005   "fixed bone that stuck out in my ankle area"  . HEMORRHOID BANDING    . IMPLANTABLE CARDIOVERTER DEFIBRILLATOR IMPLANT  09/06/09   BSX dual chamber ICD implanted in [place] for cardiac arrest 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  . INGUINAL HERNIA REPAIR Right ~ 1995  . LEFT HEART CATHETERIZATION WITH CORONARY ANGIOGRAM N/A 11/25/2012   demonstrated EF 30%, inferior akinesis with mild hypokinesis of all walls, patent LAD and RCA stents; ostial PDA  with 80-90% obstruction with medical therapy recommended  . MOHS SURGERY  2008   nose, skin graft  . POLYPECTOMY  12/21/2017   Procedure: POLYPECTOMY;  Surgeon: [name], MD;  Location: [facility];  Service: Endoscopy;;  . RETINAL DETACHMENT SURGERY Right 2013  . TENOLYSIS Right 12/21/2013   Procedure: TENOLYSIS FLEXOR CARPI RADIALIS ,DEBRIDEMENT RIGHT JOINT WRIST,DEBRIDEMENT SCAPHOTRAPEZIAL TRAPEZOID, REPAIR OF EXTENSOR HOOD;  Surgeon: Daryll Brod, MD;  Location: [facility];  Service: Orthopedics;  Laterality: Right;  . V-TACH ABLATION  12/21/2014  . VIDEO BRONCHOSCOPY Bilateral 01/09/2016   Procedure: VIDEO BRONCHOSCOPY WITHOUT FLUORO;  Surgeon: Juanito Doom, MD;  Location: [facility];  Service: Cardiopulmonary;  Laterality: Bilateral;   Social History:   reports that he has been smoking cigarettes. He has a 55.00 pack-year smoking history. He has never used smokeless tobacco. He reports  current alcohol use. He reports that he does not use drugs.  Family History  Problem Relation Age of Onset  . Heart attack Brother   . CAD Father   . Hypertension Father   . CAD Mother   . Hypertension Mother   . Hypertension Brother   . Stroke Neg Hx     Medications: Patient's Medications  New Prescriptions   No medications on file  Previous Medications   ALBUTEROL (PROVENTIL) (2.5 MG/3ML) 0.083% NEBULIZER SOLUTION    Take 3 mLs (2.5 mg total) by nebulization every 6 (six) hours as needed for wheezing or shortness of breath.   AMIODARONE (PACERONE) 200 MG TABLET    TAKE 1 TABLET(200 MG) BY MOUTH DAILY   ARTIFICIAL TEAR SOLUTION (SOOTHE XP OP)    Place 2 drops into both eyes daily as needed (dry eyes).   ASPIRIN EC 81 MG TABLET    Take 1 tablet (81 mg total) by mouth every Monday, Wednesday, and Friday.   ATORVASTATIN (LIPITOR) 80 MG TABLET    TAKE 1 TABLET(80 MG) BY MOUTH DAILY   BENAZEPRIL (LOTENSIN) 10 MG TABLET    TAKE 1 TABLET BY MOUTH ONCE DAILY   BUDESONIDE-FORMOTEROL (SYMBICORT) 160-4.5 MCG/ACT INHALER    Inhale 2 puffs into the lungs 2 (two) times daily.   BUPROPION (WELLBUTRIN) 75 MG TABLET    TAKE 1 TABLET(75 MG) BY MOUTH DAILY   BUSPIRONE (BUSPAR) 15 MG TABLET    Take 15 mg by mouth 2 (two) times daily. Take with meals   CARVEDILOL (COREG) 3.125 MG TABLET    TAKE 1 TABLET(3.125 MG) BY MOUTH TWICE DAILY   CHOLINE FENOFIBRATE (FENOFIBRIC ACID) 135 MG CPDR    TAKE 1 CAPSULE BY MOUTH DAILY   CYANOCOBALAMIN (B-12) 1000 MCG/ML KIT    Inject 1,000 mcg as directed every 30 (thirty) days.   CYANOCOBALAMIN (RA VITAMIN B-12) 1000 MCG/ML LIQD    Take 1,000 mcg by mouth daily.   DOCUSATE SODIUM (COLACE) 100 MG CAPSULE    Take 100 mg by mouth 2 (two) times daily.   ESCITALOPRAM (LEXAPRO) 20 MG TABLET    TAKE 1 TABLET(20 MG) BY MOUTH DAILY   FLUTICASONE (FLONASE) 50 MCG/ACT NASAL SPRAY    INSTILL 2 SPRAYS INTO EACH NOSTRIL QD   FUROSEMIDE (LASIX) 40 MG TABLET    TAKE 1/2 TABLET(20 MG)  BY MOUTH DAILY   IPRATROPIUM-ALBUTEROL (COMBIVENT RESPIMAT) 20-100 MCG/ACT AERS RESPIMAT    Inhale 1 puff into the lungs every 6 (six) hours. Shortness of breath or wheezing   IRON PO    Take by mouth daily.    MAGNESIUM-OXIDE  400 (241.3 MG) MG TABLET    TAKE 1 TABSULE BY MOUTH DAILY   MEXILETINE (MEXITIL) 200 MG CAPSULE    TAKE 1 CAPSULE(200 MG) BY MOUTH TWICE DAILY   MULTIPLE VITAMINS-MINERALS (CENTRUM ADULTS PO)    Take by mouth daily.   NITROGLYCERIN (NITROSTAT) 0.4 MG SL TABLET    DISSOLVE 1 TABLET UNDER THE TONGUE EVERY 5 MINUTES FOR 3 DOSES AS NEEDED FOR CHEST PAIN   OMEPRAZOLE (PRILOSEC) 20 MG CAPSULE    Take 20 mg by mouth daily.    POTASSIUM CHLORIDE SA (K-DUR) 20 MEQ TABLET    TAKE 1 TABLET(20 MEQ) BY MOUTH DAILY   RESPIRATORY THERAPY SUPPLIES (FLUTTER) DEVI    Use as directed   SENNA (SENOKOT) 8.6 MG TABLET    Take 1-2 tablets by mouth daily.   TAMSULOSIN (FLOMAX) 0.4 MG CAPS CAPSULE    TAKE 1 CAPSULE(0.4 MG) BY MOUTH DAILY AFTER SUPPER   TIOTROPIUM BROMIDE MONOHYDRATE (SPIRIVA RESPIMAT) 2.5 MCG/ACT AERS    Inhale 2 puffs into the lungs daily.   XARELTO 20 MG TABS TABLET    TAKE 1 TABLET BY MOUTH EVERY DAY WITH SUPPER  Modified Medications   No medications on file  Discontinued Medications   BACLOFEN 5 MG TABS    Take 5 mg by mouth 3 (three) times daily.   BUSPIRONE (BUSPAR) 15 MG TABLET    TAKE 1 TABLET(15 MG) BY MOUTH THREE TIMES DAILY   CETIRIZINE (ZYRTEC) 10 MG TABLET    Take 10 mg by mouth daily as needed for allergies.   CHOLECALCIFEROL (VITAMIN D3) 2000 UNITS CHEW    Chew 4,000 Units by mouth daily.    CYANOCOBALAMIN (,VITAMIN B-12,) 1000 MCG/ML INJECTION    Inject 1 mL (1,000 mcg total) into the muscle every 14 (fourteen) days.   DOXYCYCLINE (VIBRA-TABS) 100 MG TABLET    Take 1 tablet (100 mg total) by mouth 2 (two) times daily.   HYDROCODONE-ACETAMINOPHEN (NORCO) 5-325 MG TABLET    Take 1 tablet by mouth every 6 (six) hours as needed (for pain; may cause constipation).    OMEGA-3 FATTY ACIDS (FISH OIL) 1000 MG CAPS    Take 1,000 mg by mouth daily.   SYRINGE/NEEDLE, DISP, (SYRINGE 3CC/25GX5/8") 25G X 5/8" 3 ML MISC    1 each by Does not apply route every 14 (fourteen) days.    Physical Exam:  Vitals:   12/14/18 1330  BP: 100/68  Pulse: 73  Temp: (!) 97 F (36.1 C)  TempSrc: Oral  SpO2: 91%  Weight: 183 lb 9.6 oz (83.3 kg)  Height: 5' 11"  (1.803 m)   Body mass index is 25.61 kg/m. Wt Readings from Last 3 Encounters:  12/14/18 183 lb 9.6 oz (83.3 kg)  11/26/18 188 lb (85.3 kg)  11/18/18 185 lb (83.9 kg)    Physical Exam Constitutional:      General: He is not in acute distress.    Appearance: He is well-developed. He is not diaphoretic.  HENT:     Head: Normocephalic and atraumatic.     Mouth/Throat:     Pharynx: No oropharyngeal exudate.  Eyes:     Conjunctiva/sclera: Conjunctivae normal.     Pupils: Pupils are equal, round, and reactive to light.  Neck:     Musculoskeletal: Normal range of motion and neck supple.  Cardiovascular:     Rate and Rhythm: Normal rate and regular rhythm.     Heart sounds: Normal heart sounds.  Pulmonary:     Effort: Pulmonary  effort is normal.     Breath sounds: Normal breath sounds.     Comments: Tenderness to left chest where he recently had rib fractures  Abdominal:     General: Bowel sounds are normal.     Palpations: Abdomen is soft.  Musculoskeletal:        General: No tenderness.  Skin:    General: Skin is warm and dry.     Findings: Bruising (to bilaterall upper and lower extermities) present.  Neurological:     Mental Status: He is alert and oriented to person, place, and time.     Labs reviewed: Basic Metabolic Panel: Recent Labs    03/09/18 1018 04/25/18 1602 09/28/18 1030  NA 138  --  138  K 4.1  --  3.6  CL 104  --  102  CO2 27  --  29  GLUCOSE 104*  --  69*  BUN 13  --  11  CREATININE 1.08  --  0.92  CALCIUM 9.9  --  9.6  MG  --   --  1.7  TSH  --  1.450 1.97   Liver  Function Tests: Recent Labs    03/09/18 1018 09/28/18 1030  AST 12 13  ALT 13 15  ALKPHOS 38* 43  BILITOT 0.8 0.6  PROT 7.0 6.7  ALBUMIN 4.1 4.2   No results for input(s): LIPASE, AMYLASE in the last 8760 hours. No results for input(s): AMMONIA in the last 8760 hours. CBC: Recent Labs    03/09/18 1018 09/28/18 1030  WBC 10.8* 7.2  NEUTROABS 8.3*  --   HGB 13.0 13.7  HCT 39.5 41.8  MCV 93.4 100.4*  PLT 208.0 209.0   Lipid Panel: No results for input(s): CHOL, HDL, LDLCALC, TRIG, CHOLHDL, LDLDIRECT in the last 8760 hours. TSH: Recent Labs    04/25/18 1602 09/28/18 1030  TSH 1.450 1.97   A1C: Lab Results  Component Value Date   HGBA1C 5.6 12/29/2016     Assessment/Plan 1. Need for influenza vaccination - Flu Vaccine QUAD High Dose(Fluad)  2. Paroxysmal atrial fibrillation (HCC) Rate controlled with amiodarone and coreg.  continues on xarelto for anticoagulation   3. Coronary artery disease involving native coronary artery of native heart without angina pectoris Stable without chest pains, continues on asa and xarelto with lipitor  4. Chronic systolic heart failure (HCC) Stable, does not appear to be fluid overloaded. Continues with follow up with cardiology. continues on coreg, benazepril, and lasix  5. Essential hypertension Stable on current regimen.  6. COPD GOLD GRADE C Encouraged to stop smoking. Reports he is going well on current regimen with albuterol and combivent as needed, using symbicort 2 puffs twice daily and spiriva.  7. Gastroesophageal reflux disease without esophagitis Stable on omeprazole   8. Benign prostatic hyperplasia, unspecified whether lower urinary tract symptoms present Stable, continues with flomax but symptoms improved since on  flomax  9. Anemia, unspecified type hgb stable, continues on iron supplement.  10. Other hyperlipidemia Will need fasting lab work to update lipids.  -continues on lipitor 80 mg daily  11.  B12 deficiency Low on last labs, he now is taking injection with oral supplement  12. Constipation, unspecified constipation type Stable with stool softener  13. Smoker -encouraged cessation    Next appt:1 month for AWV and follow up [place] [place], [facility] [phone]

## 2018-12-14 NOTE — ED Triage Notes (Signed)
Pt states he started coughing up blood tonight  Around 10 pm  Pt states he cleared his throat and noticed it was bright red blood  Pt coughed up some pink sputum in triage  Pt has hx of COPD

## 2018-12-14 NOTE — Patient Instructions (Addendum)
Can take tylenol 500 mg 1-2 tablets every 8 hours as needed for pain. Max dose of tylenol is 3000 mg daily  To use heating pad to left upper back 2-3 times daily followed by muscle rub  Follow up in 1 month for AWV and routine follow up, come fasting and we will get blood work this day.

## 2018-12-15 ENCOUNTER — Telehealth: Payer: Self-pay | Admitting: Pulmonary Disease

## 2018-12-15 ENCOUNTER — Encounter: Payer: Self-pay | Admitting: Pulmonary Disease

## 2018-12-15 ENCOUNTER — Emergency Department (HOSPITAL_BASED_OUTPATIENT_CLINIC_OR_DEPARTMENT_OTHER): Payer: Medicare Other

## 2018-12-15 ENCOUNTER — Ambulatory Visit (INDEPENDENT_AMBULATORY_CARE_PROVIDER_SITE_OTHER): Payer: Medicare Other | Admitting: Pulmonary Disease

## 2018-12-15 ENCOUNTER — Emergency Department (HOSPITAL_BASED_OUTPATIENT_CLINIC_OR_DEPARTMENT_OTHER)
Admission: EM | Admit: 2018-12-15 | Discharge: 2018-12-15 | Disposition: A | Discharge status: Home / Self Care | Payer: Medicare Other | Attending: Emergency Medicine | Admitting: Emergency Medicine

## 2018-12-15 DIAGNOSIS — J410 Simple chronic bronchitis: Secondary | ICD-10-CM | POA: Diagnosis not present

## 2018-12-15 DIAGNOSIS — R042 Hemoptysis: Secondary | ICD-10-CM | POA: Diagnosis not present

## 2018-12-15 DIAGNOSIS — Z720 Tobacco use: Secondary | ICD-10-CM

## 2018-12-15 DIAGNOSIS — J432 Centrilobular emphysema: Secondary | ICD-10-CM | POA: Diagnosis not present

## 2018-12-15 DIAGNOSIS — F17210 Nicotine dependence, cigarettes, uncomplicated: Secondary | ICD-10-CM | POA: Diagnosis not present

## 2018-12-15 DIAGNOSIS — J438 Other emphysema: Secondary | ICD-10-CM | POA: Insufficient documentation

## 2018-12-15 LAB — COMPREHENSIVE METABOLIC PANEL
ALT: 25 U/L (ref 0–44)
AST: 18 U/L (ref 15–41)
Albumin: 3.9 g/dL (ref 3.5–5.0)
Alkaline Phosphatase: 51 U/L (ref 38–126)
Anion gap: 7 (ref 5–15)
BUN: 12 mg/dL (ref 8–23)
CO2: 26 mmol/L (ref 22–32)
Calcium: 9.3 mg/dL (ref 8.9–10.3)
Chloride: 105 mmol/L (ref 98–111)
Creatinine, Ser: 1.05 mg/dL (ref 0.61–1.24)
GFR calc Af Amer: >60 mL/min (ref >60)
GFR calc non Af Amer: >60 mL/min (ref >60)
Glucose, Bld: 99 mg/dL (ref 70–99)
Potassium: 4 mmol/L (ref 3.5–5.1)
Sodium: 138 mmol/L (ref 135–145)
Total Bilirubin: 0.6 mg/dL (ref 0.3–1.2)
Total Protein: 6.6 g/dL (ref 6.5–8.1)

## 2018-12-15 LAB — CBC WITH DIFFERENTIAL/PLATELET
Abs Immature Granulocytes: 0.07 10*3/uL (ref 0.00–0.07)
Basophils Absolute: 0.1 10*3/uL (ref 0.0–0.1)
Basophils Relative: 1 %
Eosinophils Absolute: 0.1 10*3/uL (ref 0.0–0.5)
Eosinophils Relative: 2 %
HCT: 40.1 % (ref 39.0–52.0)
Hemoglobin: 13.3 g/dL (ref 13.0–17.0)
Immature Granulocytes: 1 %
Lymphocytes Relative: 22 %
Lymphs Abs: 1.5 10*3/uL (ref 0.7–4.0)
MCH: 34.1 pg — ABNORMAL HIGH (ref 26.0–34.0)
MCHC: 33.2 g/dL (ref 30.0–36.0)
MCV: 102.8 fL — ABNORMAL HIGH (ref 80.0–100.0)
Monocytes Absolute: 0.8 10*3/uL (ref 0.1–1.0)
Monocytes Relative: 12 %
Neutro Abs: 4.2 10*3/uL (ref 1.7–7.7)
Neutrophils Relative %: 62 %
Platelets: 191 10*3/uL (ref 150–400)
RBC: 3.9 MIL/uL — ABNORMAL LOW (ref 4.22–5.81)
RDW: 14.6 % (ref 11.5–15.5)
WBC: 6.8 10*3/uL (ref 4.0–10.5)
nRBC: 0 % (ref 0.0–0.2)

## 2018-12-15 MED ORDER — BENZONATATE 200 MG PO CAPS: 200.0000 mg | ORAL_CAPSULE | Freq: Three times a day (TID) | ORAL | 2 refills | Status: DC | PRN

## 2018-12-15 NOTE — ED Provider Notes (Signed)
 [facility] Provider Note: J. Lane , MD, FACEP  CSN: [id number] MRN: [id number] ARRIVAL: 12/14/18 at 2312 ROOM: MH03/MH03   CHIEF COMPLAINT  Hemoptysis   HISTORY OF PRESENT ILLNESS  12/15/18 1:58 AM Steven Guzman is a 78 y.o. male with a history of COPD who had several episodes of hemoptysis beginning about 10 PM yesterday evening.  The first episode was bright red blood and subsequently blood-tinged sputum.  Symptoms have now resolved and his sputum is clear.  He has a history of similar episodes but this was worse.  He denies associated pain or fever.  He denies change in his chronic cough and shortness of breath.  He is on Xarelto.  He has a pulmonologist, Dr. Mcquaid.   Past Medical History:  Diagnosis Date  . AICD (automatic cardioverter/defibrillator) present   . Aneurysm (HCC)    a. Aneurysmal infrarenal aorta up to 33 mm on CT 10/2014, recommended f/u due 10/2017  . Anginal pain (HCC)   . Anxiety   . Basal cell carcinoma of nose    S/P MOHS  . Biliary acute pancreatitis   . CAD (coronary artery disease)    a. s/p MI in 1994 with PCI to LAD at that time b. cath 10/2012 demonstrated EF 30%, inferior akinesis with mild hypokinesis of all walls, patent LAD and RCA stents; ostial PDA with 80-90% obstruction with medical therapy recommended   . Chronic systolic CHF (congestive heart failure) (HCC)    EF 30 to 35 % as of 09/2014.   . CKD (chronic kidney disease), stage III (HCC)   . Complication of anesthesia 10/2014   "had to have defibrillator w/ERCP"  . COPD (chronic obstructive pulmonary disease) (HCC)    a. followed by pulmonary, COPD GOLD stage II  . Depression   . Diverticulosis of colon 07/2014   noted on CT  . GERD (gastroesophageal reflux disease)   . Hiatal hernia   . Hyperglycemia 10/2012.  . Hyperlipidemia   . Hypertension   . Myocardial infarction (HCC) 1994; 2011  . Pneumonia 1946; 2015  . Prostate enlargement 07/2014   observed on CT  .  Tobacco abuse   . Ventricular tachycardia (HCC)    a. 08/2009 s/p BSX E110 Teligen 100 AICD, ser#: [id number];  b. 08/2008 VT req ATP - detection reprogrammed from 160 to 150. c. EPS and VT ablation by Dr. Taylor 12/21/2014    Past Surgical History:  Procedure Laterality Date  . BIOPSY  12/21/2017   Procedure: BIOPSY;  Surgeon: Perry,  N, MD;  Location: [facility];  Service: Endoscopy;;  . CATARACT EXTRACTION W/ INTRAOCULAR LENS  IMPLANT, BILATERAL Bilateral ~ 2011  . COLONOSCOPY    . COLONOSCOPY WITH PROPOFOL N/A 12/21/2017   Procedure: COLONOSCOPY WITH PROPOFOL;  Surgeon: Perry,  N, MD;  Location: [facility];  Service: Endoscopy;  Laterality: N/A;  . ELECTROPHYSIOLOGIC STUDY N/A 12/21/2014   Procedure: V Tach Ablation;  Surgeon: Gregg W Taylor, MD;  [facility]: [facility];  Service: Cardiovascular;  Laterality: N/A;  . ERCP N/A 11/16/2014   Procedure: ENDOSCOPIC RETROGRADE CHOLANGIOPANCREATOGRAPHY (ERCP);  Surgeon: Robert D Kaplan, MD;  Location: [facility];  Service: Endoscopy;  Laterality: N/A;  . ESOPHAGOGASTRODUODENOSCOPY (EGD) WITH PROPOFOL N/A 12/21/2017   Procedure: ESOPHAGOGASTRODUODENOSCOPY (EGD) WITH PROPOFOL;  Surgeon: Perry,  N, MD;  [facility];  Service: Endoscopy;  Laterality: N/A;  . EYE SURGERY    . FOOT SURGERY Left 2005   "fixed bone that stuck out   in my ankle area"  . HEMORRHOID BANDING    . IMPLANTABLE CARDIOVERTER DEFIBRILLATOR IMPLANT  09/06/09   BSX dual chamber ICD implanted in [place] for cardiac arrest 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  . INGUINAL HERNIA REPAIR Right ~ 1995  . LEFT HEART CATHETERIZATION WITH CORONARY ANGIOGRAM N/A 11/25/2012   demonstrated EF 30%, inferior akinesis with mild hypokinesis of all walls, patent LAD and RCA stents; ostial PDA with 80-90% obstruction with medical therapy recommended  . MOHS SURGERY  2008   nose, skin graft  . POLYPECTOMY  12/21/2017   Procedure: POLYPECTOMY;  Surgeon: Perry,  N, MD;  [facility];  Service: Endoscopy;;  . RETINAL DETACHMENT SURGERY Right 2013  . TENOLYSIS Right 12/21/2013   Procedure: TENOLYSIS FLEXOR CARPI RADIALIS ,DEBRIDEMENT RIGHT JOINT WRIST,DEBRIDEMENT SCAPHOTRAPEZIAL TRAPEZOID, REPAIR OF EXTENSOR HOOD;  Surgeon: Gary Kuzma, MD;  Location: [facility];  Service: Orthopedics;  Laterality: Right;  . V-TACH ABLATION  12/21/2014  . VIDEO BRONCHOSCOPY Bilateral 01/09/2016   Procedure: VIDEO BRONCHOSCOPY WITHOUT FLUORO;  Surgeon: Douglas B McQuaid, MD;  Location[facility];  Service: Cardiopulmonary;  Laterality: Bilateral;    Family History  Problem Relation Age of Onset  . Heart attack Brother   . CAD Father   . Hypertension Father   . CAD Mother   . Hypertension Mother   . Hypertension Brother   . Stroke Neg Hx     Social History   Tobacco Use  . Smoking status: Current Every Day Smoker    Packs/day: 1.00    Years: 55.00    Pack years: 55.00    Types: Cigarettes    Last attempt to quit: 10/12/2016    Years since quitting: 2.1  . Smokeless tobacco: Never Used  . Tobacco comment: always ready to quit but does not work out  Substance Use Topics  . Alcohol use: Yes    Alcohol/week: 0.0 standard drinks    Comment: "might have ETOH once or twice a month"  . Drug use: No    Prior to Admission medications   Medication Sig Start Date End Date Taking? Authorizing Provider  albuterol (PROVENTIL) (2.5 MG/3ML) 0.083% nebulizer solution Take 3 mLs (2.5 mg total) by nebulization every 6 (six) hours as needed for wheezing or shortness of breath. 06/03/18   McQuaid, Douglas B, MD  amiodarone (PACERONE) 200 MG tablet TAKE 1 TABLET(200 MG) BY MOUTH DAILY 06/16/18   Smith, Henry W, MD  Artificial Tear Solution (SOOTHE XP OP) Place 2 drops into both eyes daily as needed (dry eyes).    [provider]  aspirin EC 81 MG tablet Take 1 tablet (81 mg total) by mouth every Monday, Wednesday, and Friday. 04/25/18   Smith, Henry W, MD   atorvastatin (LIPITOR) 80 MG tablet TAKE 1 TABLET(80 MG) BY MOUTH DAILY 10/21/18   Gerhardt, Lori C, NP  benazepril (LOTENSIN) 10 MG tablet TAKE 1 TABLET BY MOUTH ONCE DAILY 12/28/17   Smith, Henry W, MD  budesonide-formoterol (SYMBICORT) 160-4.5 MCG/ACT inhaler Inhale 2 puffs into the lungs 2 (two) times daily. 11/11/18   Mack, Brian P, NP  buPROPion (WELLBUTRIN) 75 MG tablet TAKE 1 TABLET(75 MG) BY MOUTH DAILY 09/28/18   Crawford, Elizabeth A, MD  busPIRone (BUSPAR) 15 MG tablet Take 15 mg by mouth 2 (two) times daily. Take with meals    [provider]  carvedilol (COREG) 3.125 MG tablet TAKE 1 TABLET(3.125 MG) BY MOUTH TWICE DAILY 09/05/18   Crawford, Elizabeth A, MD    Choline Fenofibrate (FENOFIBRIC ACID) 135 MG CPDR TAKE 1 CAPSULE BY MOUTH DAILY 10/26/18   Belva Crome, MD  Cyanocobalamin (B-12) 1000 MCG/ML KIT Inject 1,000 mcg as directed every 30 (thirty) days.    [provider]  Cyanocobalamin (RA VITAMIN B-12) 1000 MCG/ML LIQD Take 1,000 mcg by mouth daily.    [provider]  docusate sodium (COLACE) 100 MG capsule Take 100 mg by mouth 2 (two) times daily.    [provider]  escitalopram (LEXAPRO) 20 MG tablet TAKE 1 TABLET(20 MG) BY MOUTH DAILY 07/25/18   Hoyt Koch, MD  fluticasone [facility]) 50 MCG/ACT nasal spray INSTILL 2 SPRAYS INTO EACH NOSTRIL QD 04/14/18   [provider]  furosemide (LASIX) 40 MG tablet TAKE 1/2 TABLET(20 MG) BY MOUTH DAILY 06/20/18   Belva Crome, MD  Ipratropium-Albuterol (COMBIVENT RESPIMAT) 20-100 MCG/ACT AERS respimat Inhale 1 puff into the lungs every 6 (six) hours. Shortness of breath or wheezing 01/28/17   Juanito Doom, MD  IRON PO Take by mouth daily.     [provider]  MAGNESIUM-OXIDE 400 (241.3 Mg) MG tablet TAKE 1 TABSULE BY MOUTH DAILY 11/24/18   Hoyt Koch, MD  mexiletine (MEXITIL) 200 MG capsule TAKE 1 CAPSULE(200 MG) BY MOUTH TWICE DAILY 11/09/18   [name], MD   Multiple Vitamins-Minerals (CENTRUM ADULTS PO) Take by mouth daily.    [provider]  nitroGLYCERIN (NITROSTAT) 0.4 MG SL tablet DISSOLVE 1 TABLET UNDER THE TONGUE EVERY 5 MINUTES FOR 3 DOSES AS NEEDED FOR CHEST PAIN 09/26/18   [name], MD  omeprazole (PRILOSEC) 20 MG capsule Take 20 mg by mouth daily.     [provider]  potassium chloride SA (K-DUR) 20 MEQ tablet TAKE 1 TABLET(20 MEQ) BY MOUTH DAILY 10/24/18   Hoyt Koch, MD  Respiratory Therapy Supplies (FLUTTER) DEVI Use as directed 03/16/17   Juanito Doom, MD  senna (SENOKOT) 8.6 MG tablet Take 1-2 tablets by mouth daily.    [provider]  tamsulosin (FLOMAX) 0.4 MG CAPS capsule TAKE 1 CAPSULE(0.4 MG) BY MOUTH DAILY AFTER SUPPER 10/26/18   Hoyt Koch, MD  Tiotropium Bromide Monohydrate (SPIRIVA RESPIMAT) 2.5 MCG/ACT AERS Inhale 2 puffs into the lungs daily. 04/25/18   Juanito Doom, MD  XARELTO 20 MG TABS tablet TAKE 1 TABLET BY MOUTH EVERY DAY WITH SUPPER 11/03/18   [name], MD    Allergies Sulfa antibiotics   REVIEW OF SYSTEMS  Negative except as noted here or in the History of Present Illness.   PHYSICAL EXAMINATION  Initial Vital Signs Blood pressure 123/74, pulse (!) 59, temperature 98.9 F (37.2 C), temperature source Oral, resp. rate 16, height 5' 11" (1.803 m), weight 83.2 kg, SpO2 94 %.  Examination General: Well-developed, well-nourished male in no acute distress; appearance consistent with age of record HENT: normocephalic; atraumatic; pharynx normal Eyes: pupils equal, round and reactive to light; extraocular muscles intact Neck: supple Heart: regular rate and rhythm Lungs: Faint expiratory wheezes Abdomen: soft; nondistended; nontender; bowel sounds present Extremities: Riddick changes; normal range of motion for age Neurologic: Awake, alert and oriented; motor function intact in all extremities and symmetric; no facial droop Skin: Warm and  dry Psychiatric: Normal mood and affect   RESULTS  Summary of this visit's results, reviewed by myself:   EKG Interpretation  Date/Time:    Ventricular Rate:    PR Interval:    QRS Duration:   QT  Interval:    QTC Calculation:   R Axis:     Text Interpretation:        Laboratory Studies: Results for orders placed or performed during the hospital encounter of 12/15/18 (from the past 24 hour(s))  CBC with Differential     Status: Abnormal   Collection Time: 12/15/18  1:01 AM  Result Value Ref Range   WBC 6.8 4.0 - 10.5 K/uL   RBC 3.90 (L) 4.22 - 5.81 MIL/uL   Hemoglobin 13.3 13.0 - 17.0 g/dL   HCT 40.1 39.0 - 52.0 %   MCV 102.8 (H) 80.0 - 100.0 fL   MCH 34.1 (H) 26.0 - 34.0 pg   MCHC 33.2 30.0 - 36.0 g/dL   RDW 14.6 11.5 - 15.5 %   Platelets 191 150 - 400 K/uL   nRBC 0.0 0.0 - 0.2 %   Neutrophils Relative % 62 %   Neutro Abs 4.2 1.7 - 7.7 K/uL   Lymphocytes Relative 22 %   Lymphs Abs 1.5 0.7 - 4.0 K/uL   Monocytes Relative 12 %   Monocytes Absolute 0.8 0.1 - 1.0 K/uL   Eosinophils Relative 2 %   Eosinophils Absolute 0.1 0.0 - 0.5 K/uL   Basophils Relative 1 %   Basophils Absolute 0.1 0.0 - 0.1 K/uL   Immature Granulocytes 1 %   Abs Immature Granulocytes 0.07 0.00 - 0.07 K/uL  Comprehensive metabolic panel     Status: None   Collection Time: 12/15/18  1:01 AM  Result Value Ref Range   Sodium 138 135 - 145 mmol/L   Potassium 4.0 3.5 - 5.1 mmol/L   Chloride 105 98 - 111 mmol/L   CO2 26 22 - 32 mmol/L   Glucose, Bld 99 70 - 99 mg/dL   BUN 12 8 - 23 mg/dL   Creatinine, Ser 1.05 0.61 - 1.24 mg/dL   Calcium 9.3 8.9 - 10.3 mg/dL   Total Protein 6.6 6.5 - 8.1 g/dL   Albumin 3.9 3.5 - 5.0 g/dL   AST 18 15 - 41 U/L   ALT 25 0 - 44 U/L   Alkaline Phosphatase 51 38 - 126 U/L   Total Bilirubin 0.6 0.3 - 1.2 mg/dL   GFR calc non Af Amer >60 >60 mL/min   GFR calc Af Amer >60 >60 mL/min   Anion gap 7 5 - 15   Imaging Studies: Dg Chest 2 View  Result Date: 12/15/2018  CLINICAL DATA:  78-year-old male with hemoptysis tonight. EXAM: CHEST - 2 VIEW COMPARISON:  Chest radiographs 11/18/2018 and earlier. FINDINGS: PA and lateral views. Stable lung volumes. Stable cardiac size and mediastinal contours. Small chronic hiatal hernia. Tortuous thoracic aorta. Stable left chest AICD. No pneumothorax, pulmonary edema, pleural effusion or acute pulmonary opacity. Chronic increased interstitial markings. Negative visible bowel gas pattern. No acute osseous abnormality identified. IMPRESSION: Chronic hyperinflation.  No acute cardiopulmonary abnormality. Electronically Signed   By: H  Hall M.D.   On: 12/15/2018 01:28    ED COURSE and MDM  Nursing notes and initial vitals signs, including pulse oximetry, reviewed.  Vitals:   12/14/18 2320 12/14/18 2322 12/15/18 0155  BP:  122/71 123/74  Pulse:  74 (!) 59  Resp:  18 16  Temp:  98.9 F (37.2 C)   TempSrc:  Oral   SpO2:  94% 94%  Weight: 83.2 kg    Height: 5' 11" (1.803 m)     The patient's chest x-ray is negative for infiltrate or tumor.    I do not believe a CT scan would be as beneficial at this point is with a bronchoscopy with his pulmonologist.  He was advised to contact Dr. Anastasia Pall office to set this up or to return if symptoms worsen.  PROCEDURES    ED DIAGNOSES     ICD-10-CM   1. Hemoptysis  R04.2        Alonzo Owczarzak, Jenny Reichmann, MD 12/15/18 [phone]

## 2018-12-15 NOTE — Assessment & Plan Note (Addendum)
Chest x-ray in emergency room stable 2019 CT chest stable 55-pack-year smoking history, current everyday smoker 1 pack/day Hemoptysis in the setting of smoker taking Xarelto Stable CBC  Plan: Close follow-up with our office to establish with Dr. Ander Slade Can discuss need for potential bronchoscopy at that time We will work on cough suppression today with Ladona Ridgel Can also use Delsym over-the-counter cough medicine You need to stop smoking

## 2018-12-15 NOTE — Progress Notes (Signed)
Virtual Visit via Telephone Note  I connected with [name] on 12/15/18 at  3:30 PM EDT by telephone and verified that I am speaking with the correct person using two identifiers.  Location: Patient: Home Provider: Office [facility] - [id number], [place], [place], [place] [address]   I discussed the limitations, risks, security and privacy concerns of performing an evaluation and management service by telephone and the availability of in person appointments. I also discussed with the patient that there may be a patient responsible charge related to this service. The patient expressed understanding and agreed to proceed.  Patient consented to consult via telephone: Yes People present and their role in pt care: Pt   History of Present Illness:  78 year old male current every day smoker followed in our office for COPD  Past medical history: CAD, hypertension, heart failure, A. fib, depression, dizziness, GERD Smoking history: Current everyday smoker. 1 ppd. 55 pack year history.   Maintenance: Spiriva Respimat 2.5, Symbicort 160 Patient of Dr. Lake Bells  Chief complaint: Hemoptysis   78 year old male current every day smoker followed in our office for COPD.  Patient contacted our office today to notify us that he has been having episodes of hemoptysis.  Patient also presented to the emergency room on 12/14/2018 for evaluation of hemoptysis.  This has happened in the past this patient had a bronchoscopy in 2017 for a normal chest x-ray and hemoptysis.  Patient reports that he has a chronic cough with productive sputum and starting to have mixed with bloody sputum.  Chest x-ray performed in the emergency room was stable. Chronic cough with productive sputum   Patient denies other symptoms such as fever, fatigue, flulike symptoms.  Patient CBC was stable when performed to the emergency room yesterday.  Patient continues to be maintained on Symbicort 160 as well as Spiriva  Respimat 2.5.  He also has Combivent which he uses 1-2 times a week.  Patient continues to smoke at least 1 pack/day.  12/15/2018- Smoking assessment and cessation counseling  Patient currently smoking: 1ppd I have advised the patient to quit/stop smoking as soon as possible due to high risk for multiple medical problems.  It will also be very difficult for [place] to manage patient's  respiratory symptoms and status if we continue to expose her lungs to a known irritant.  We do not advise e-cigarettes as a form of stopping smoking.  Patient is not willing to quit smoking.  I have advised the patient that we can assist and have options of nicotine replacement therapy, provided smoking cessation education today, provided smoking cessation counseling, and provided cessation resources.  Follow-up next office visit office visit for assessment of smoking cessation.   Smoking cessation counseling advised for:  5 min      Observations/Objective:  12/15/2018-CBC with differential-eosinophils relative 2, eosinophils absolute 0.1  12/15/2018-chest x-ray-chronic hyperinflation, no acute cardiopulmonary abnormality  09/16/2018 - echocardiogram - LVEF 40-45 percent, grade 1 diastolic dysfunction   [id number] chest with contrast- biapical pleural-parenchymal scarring, mild paraseptal emphysematous changes, upper lobe predominant  Assessment and Plan:  Tobacco abuse Plan: You need to stop smoking Patient is due for flu vaccine  Hemoptysis Chest x-ray in emergency room stable 2019 CT chest stable 55-pack-year smoking history, current everyday smoker 1 pack/day Hemoptysis in the setting of smoker taking Xarelto Stable CBC  Plan: Close follow-up with our office to establish with Dr. Ander Slade Can discuss need for potential bronchoscopy at that time We will work  on cough suppression today with Tessalon Perles Can also use Delsym over-the-counter cough medicine You need to stop  smoking  Paraseptal emphysema (HCC) Plan: Continue Symbicort 160 Continue Spiriva Respimat 2.5 Close follow-up in our office to establish with Dr. Ander Slade Flu vaccine in fall/2020   Smokers' cough ([place]) Plan: Ladona Ridgel today  Follow Up Instructions:  Return in about 1 day (around 12/16/2018), or if symptoms worsen or fail to improve, for Follow up with Dr. Ander Slade.   I discussed the assessment and treatment plan with the patient. The patient was provided an opportunity to ask questions and all were answered. The patient agreed with the plan and demonstrated an understanding of the instructions.   The patient was advised to call back or seek an in-person evaluation if the symptoms worsen or if the condition fails to improve as anticipated.  I provided 25 minutes of non-face-to-face time during this encounter.   Lauraine Rinne, NP

## 2018-12-15 NOTE — Assessment & Plan Note (Signed)
Plan: You need to stop smoking Patient is due for flu vaccine

## 2018-12-15 NOTE — Assessment & Plan Note (Signed)
Plan: Steven Guzman today

## 2018-12-15 NOTE — Assessment & Plan Note (Signed)
Plan: Continue Symbicort 160 Continue Spiriva Respimat 2.5 Close follow-up in our office to establish with Dr. Ander Slade Flu vaccine in fall/2020

## 2018-12-15 NOTE — Patient Instructions (Signed)
You were seen today by Steven Rinne, NP  for:   1. Smokers' cough (HCC)  - benzonatate (TESSALON) 200 MG capsule; Take 1 capsule (200 mg total) by mouth 3 (three) times daily as needed for cough.  Dispense: 30 capsule; Refill: 2  2. Centrilobular emphysema (HCC)  - benzonatate (TESSALON) 200 MG capsule; Take 1 capsule (200 mg total) by mouth 3 (three) times daily as needed for cough.  Dispense: 30 capsule; Refill: 2  Continue Symbicort 160 >>> 2 puffs in the morning right when you wake up, rinse out your mouth after use, 12 hours later 2 puffs, rinse after use >>> Take this daily, no matter what >>> This is not a rescue inhaler   Continue Spiriva Respimat 2.5 >>> 2 puffs daily >>> Do this every day >>>This is not a rescue inhaler  Only use your albuterol as a rescue medication to be used if you can't catch your breath by resting or doing a relaxed purse lip breathing pattern.  - The less you use it, the better it will work when you need it. - Ok to use up to 2 puffs  every 4 hours if you must but call for immediate appointment if use goes up over your usual need - Don't leave home without it !!  (think of it like the spare tire for your car)   You need to stop smoking!  Note your daily symptoms > remember "red flags" for COPD:   >>>Increase in cough >>>increase in sputum production >>>increase in shortness of breath or activity  intolerance.   If you notice these symptoms, please call the office to be seen.    3. Hemoptysis  Stable chest x-ray On Xarelto  We will work on cough suppression and bring you into the office for close physical evaluation  Can also use over-the-counter cough medicine such as Delsym  You need to stop smoking!  - benzonatate (TESSALON) 200 MG capsule; Take 1 capsule (200 mg total) by mouth 3 (three) times daily as needed for cough.  Dispense: 30 capsule; Refill: 2  4. Tobacco Abuse   We recommend that you stop smoking.  >>>You need to set a  quit date >>>If you have friends or family who smoke, let them know you are trying to quit and not to smoke around you or in your living environment  Smoking Cessation Resources:  1 800 QUIT NOW  >>> Patient to call this resource and utilize it to help support her quit smoking >>> Keep up your hard work with stopping smoking  You can also contact the [facility] >>>For smoking cessation classes call [phone]  We do not recommend using e-cigarettes as a form of stopping smoking  You can sign up for smoking cessation support texts and information:  >>>https://smokefree.gov/smokefreetxt     We recommend today:  No orders of the defined types were placed in this encounter.  No orders of the defined types were placed in this encounter.  Meds ordered this encounter  Medications  . benzonatate (TESSALON) 200 MG capsule    Sig: Take 1 capsule (200 mg total) by mouth 3 (three) times daily as needed for cough.    Dispense:  30 capsule    Refill:  2    Follow Up:    No follow-ups on file.   Please do your part to reduce the spread of COVID-19:      Reduce your risk of any infection  and COVID19 by using  the similar precautions used for avoiding the common cold or flu:  [name] Wash your hands often with soap and warm water for at least 20 seconds.  If soap and water are not readily available, use an alcohol-based hand sanitizer with at least 60% alcohol.  . If coughing or sneezing, cover your mouth and nose by coughing or sneezing into the elbow areas of your shirt or coat, into a tissue or into your sleeve (not your hands). Langley Gauss A MASK when in public  . Avoid shaking hands with others and consider head nods or verbal greetings only. . Avoid touching your eyes, nose, or mouth with unwashed hands.  . Avoid close contact with people who are sick. . Avoid places or events with large numbers of people in one location, like concerts or sporting events. . If you have  some symptoms but not all symptoms, continue to monitor at home and seek medical attention if your symptoms worsen. . If you are having a medical emergency, call 911.   [place] / e-Visit: [email or web address]         [facility]: [facility]: [id number]                   [facility]: [id number]     It is flu season:   >>> Best ways to protect herself from the flu: Receive the yearly flu vaccine, practice good hand hygiene washing with soap and also using hand sanitizer when available, eat a nutritious meals, get adequate rest, hydrate appropriately   Please contact the office if your symptoms worsen or you have concerns that you are not improving.   Thank you for choosing [facility] for your healthcare, and for allowing [place] to partner with you on your healthcare journey. I am thankful to be able to provide care to you today.   Steven Quaker FNP-C

## 2018-12-16 ENCOUNTER — Other Ambulatory Visit: Payer: Self-pay

## 2018-12-16 ENCOUNTER — Encounter: Payer: Self-pay | Admitting: Pulmonary Disease

## 2018-12-16 ENCOUNTER — Ambulatory Visit (INDEPENDENT_AMBULATORY_CARE_PROVIDER_SITE_OTHER): Payer: Medicare Other | Admitting: Pulmonary Disease

## 2018-12-16 VITALS — BP 120/70 | HR 65 | Temp 98.2°F | Ht 71.0 in | Wt 183.4 lb

## 2018-12-16 DIAGNOSIS — R042 Hemoptysis: Secondary | ICD-10-CM

## 2018-12-16 DIAGNOSIS — R05 Cough: Secondary | ICD-10-CM

## 2018-12-16 DIAGNOSIS — R059 Cough, unspecified: Secondary | ICD-10-CM

## 2018-12-16 NOTE — Progress Notes (Signed)
EWALD BEG    [id number]    [birth year]  Primary Care Physician:Eubanks, [name], NP  Referring Physician: Hoyt Koch, MD [place],  [place] [address]  Chief complaint:    Patient being seen for follow-up of hemoptysis HPI:  He had recently been in the emergency room about hemoptysis Initially with spitting up blood tinged secretions There was no change in his cough and  He did not feel he was coughing anymore significantly when he had the hemoptysis  Hemoptysis self resolved  He has a chronic cough  Is an active smoker, over a pack a day  Did have a transient episode of hemoptysis earlier in the year-does not remember specifically when it happened-it was self-limited  He was evaluated with a bronchoscopy in 2017 following a bout of hemoptysis-bronchoscopy results was reviewed-no significant finding  He is on aspirin and Xarelto Has not felt unwell recently, has been compliant with medications, no recent addition to his medications  Has no other significant symptoms  Has not had any recurrence of hemoptysis since his visit to the emergency department   Outpatient Encounter Medications as of 12/16/2018  Medication Sig  . albuterol (PROVENTIL) (2.5 MG/3ML) 0.083% nebulizer solution Take 3 mLs (2.5 mg total) by nebulization every 6 (six) hours as needed for wheezing or shortness of breath.  [name] amiodarone (PACERONE) 200 MG tablet TAKE 1 TABLET(200 MG) BY MOUTH DAILY  . Artificial Tear Solution (SOOTHE XP OP) Place 2 drops into both eyes daily as needed (dry eyes).  [name] aspirin EC 81 MG tablet Take 1 tablet (81 mg total) by mouth every Monday, Wednesday, and Friday.  [name] atorvastatin (LIPITOR) 80 MG tablet TAKE 1 TABLET(80 MG) BY MOUTH DAILY  . benazepril (LOTENSIN) 10 MG tablet TAKE 1 TABLET BY MOUTH ONCE DAILY  . benzonatate (TESSALON) 200 MG capsule Take 1 capsule (200 mg total) by mouth 3 (three) times daily as needed for cough.  .  budesonide-formoterol (SYMBICORT) 160-4.5 MCG/ACT inhaler Inhale 2 puffs into the lungs 2 (two) times daily.  [name] buPROPion (WELLBUTRIN) 75 MG tablet TAKE 1 TABLET(75 MG) BY MOUTH DAILY  . busPIRone (BUSPAR) 15 MG tablet Take 15 mg by mouth 2 (two) times daily. Take with meals  . carvedilol (COREG) 3.125 MG tablet TAKE 1 TABLET(3.125 MG) BY MOUTH TWICE DAILY  . Choline Fenofibrate (FENOFIBRIC ACID) 135 MG CPDR TAKE 1 CAPSULE BY MOUTH DAILY  . Cyanocobalamin (B-12) 1000 MCG/ML KIT Inject 1,000 mcg as directed every 30 (thirty) days.  . Cyanocobalamin (RA VITAMIN B-12) 1000 MCG/ML LIQD Take 1,000 mcg by mouth daily.  [name] docusate sodium (COLACE) 100 MG capsule Take 100 mg by mouth 2 (two) times daily.  [name] escitalopram (LEXAPRO) 20 MG tablet TAKE 1 TABLET(20 MG) BY MOUTH DAILY  . fluticasone (FLONASE) 50 MCG/ACT nasal spray INSTILL 2 SPRAYS INTO EACH NOSTRIL QD  . furosemide (LASIX) 40 MG tablet TAKE 1/2 TABLET(20 MG) BY MOUTH DAILY  . Ipratropium-Albuterol (COMBIVENT RESPIMAT) 20-100 MCG/ACT AERS respimat Inhale 1 puff into the lungs every 6 (six) hours. Shortness of breath or wheezing  . IRON PO Take by mouth daily.   [name] MAGNESIUM-OXIDE 400 (241.3 Mg) MG tablet TAKE 1 TABSULE BY MOUTH DAILY  . mexiletine (MEXITIL) 200 MG capsule TAKE 1 CAPSULE(200 MG) BY MOUTH TWICE DAILY  . Multiple Vitamins-Minerals (CENTRUM ADULTS PO) Take by mouth daily.  . nitroGLYCERIN (NITROSTAT) 0.4 MG SL tablet DISSOLVE 1 TABLET UNDER THE  TONGUE EVERY 5 MINUTES FOR 3 DOSES AS NEEDED FOR CHEST PAIN  . omeprazole (PRILOSEC) 20 MG capsule Take 20 mg by mouth daily.   . potassium chloride SA (K-DUR) 20 MEQ tablet TAKE 1 TABLET(20 MEQ) BY MOUTH DAILY  . Respiratory Therapy Supplies (FLUTTER) DEVI Use as directed  . senna (SENOKOT) 8.6 MG tablet Take 1-2 tablets by mouth daily.  . tamsulosin (FLOMAX) 0.4 MG CAPS capsule TAKE 1 CAPSULE(0.4 MG) BY MOUTH DAILY AFTER SUPPER  . Tiotropium Bromide Monohydrate (SPIRIVA RESPIMAT) 2.5  MCG/ACT AERS Inhale 2 puffs into the lungs daily.  Alveda Reasons 20 MG TABS tablet TAKE 1 TABLET BY MOUTH EVERY DAY WITH SUPPER   No facility-administered encounter medications on file as of 12/16/2018.     Allergies as of 12/16/2018 - Review Complete 12/16/2018  Allergen Reaction Noted  . Sulfa antibiotics Hives 11/23/2012    Past Medical History:  Diagnosis Date  . AICD (automatic cardioverter/defibrillator) present   . Aneurysm ([place])    a. Aneurysmal infrarenal aorta up to 33 mm on CT 10/2014, recommended f/u due 10/2017  . Anginal pain ([place])   . Anxiety   . Basal cell carcinoma of nose    S/P MOHS  . Biliary acute pancreatitis   . CAD (coronary artery disease)    a. s/p MI in 1994 with PCI to LAD at that time b. cath 10/2012 demonstrated EF 30%, inferior akinesis with mild hypokinesis of all walls, patent LAD and RCA stents; ostial PDA with 80-90% obstruction with medical therapy recommended   . Chronic systolic CHF (congestive heart failure) ([facility])    EF 30 to 35 % as of 09/2014.   [name] CKD (chronic kidney disease), stage III ([place])   . Complication of anesthesia 10/2014   "had to have defibrillator w/ERCP"  . COPD (chronic obstructive pulmonary disease) ([place])    a. followed by pulmonary, COPD GOLD stage II  . Depression   . Diverticulosis of colon 07/2014   noted on CT  . GERD (gastroesophageal reflux disease)   . Hiatal hernia   . Hyperglycemia 10/2012.  [name] Hyperlipidemia   . Hypertension   . Myocardial infarction [facility]) 1994; 2011  . Pneumonia 1946; 2015  . Prostate enlargement 07/2014   observed on CT  . Tobacco abuse   . Ventricular tachycardia ([place])    a. 08/2009 s/p BSX E110 Teligen 100 AICD, ser#: [id number];  b. 08/2008 VT req ATP - detection reprogrammed from 160 to 150. c. EPS and VT ablation by Dr. Lovena Le 12/21/2014    Past Surgical History:  Procedure Laterality Date  . BIOPSY  12/21/2017   Procedure: BIOPSY;  Surgeon: [name], MD;  Location: [facility];  Service:  Endoscopy;;  . CATARACT EXTRACTION W/ INTRAOCULAR LENS  IMPLANT, BILATERAL Bilateral ~ 2011  . COLONOSCOPY    . COLONOSCOPY WITH PROPOFOL N/A 12/21/2017   Procedure: COLONOSCOPY WITH PROPOFOL;  Surgeon: [name], MD;  Location: [facility];  Service: Endoscopy;  Laterality: N/A;  . ELECTROPHYSIOLOGIC STUDY N/A 12/21/2014   Procedure: V Tach Ablation;  Surgeon: [name], MD;  Location: [facility];  Service: Cardiovascular;  Laterality: N/A;  . ERCP N/A 11/16/2014   Procedure: ENDOSCOPIC RETROGRADE CHOLANGIOPANCREATOGRAPHY (ERCP);  Surgeon: [name], MD;  Location: [facility];  Service: Endoscopy;  Laterality: N/A;  . ESOPHAGOGASTRODUODENOSCOPY (EGD) WITH PROPOFOL N/A 12/21/2017   Procedure: ESOPHAGOGASTRODUODENOSCOPY (EGD) WITH PROPOFOL;  Surgeon: [name], MD;  Location: [facility];  Service: Endoscopy;  Laterality: N/A;  . EYE SURGERY    . FOOT SURGERY Left 2005   "fixed bone that stuck out in my ankle area"  . HEMORRHOID BANDING    . IMPLANTABLE CARDIOVERTER DEFIBRILLATOR IMPLANT  09/06/09   BSX dual chamber ICD implanted in [place] for cardiac arrest 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  . INGUINAL HERNIA REPAIR Right ~ 1995  . LEFT HEART CATHETERIZATION WITH CORONARY ANGIOGRAM N/A 11/25/2012   demonstrated EF 30%, inferior akinesis with mild hypokinesis of all walls, patent LAD and RCA stents; ostial PDA with 80-90% obstruction with medical therapy recommended  . MOHS SURGERY  2008   nose, skin graft  . POLYPECTOMY  12/21/2017   Procedure: POLYPECTOMY;  Surgeon: [name], MD;  Location: [facility];  Service: Endoscopy;;  . RETINAL DETACHMENT SURGERY Right 2013  . TENOLYSIS Right 12/21/2013   Procedure: TENOLYSIS FLEXOR CARPI RADIALIS ,DEBRIDEMENT RIGHT JOINT WRIST,DEBRIDEMENT SCAPHOTRAPEZIAL TRAPEZOID, REPAIR OF EXTENSOR HOOD;  Surgeon: Daryll Brod, MD;  Location: [facility];  Service: Orthopedics;  Laterality: Right;  . V-TACH ABLATION  12/21/2014   . VIDEO BRONCHOSCOPY Bilateral 01/09/2016   Procedure: VIDEO BRONCHOSCOPY WITHOUT FLUORO;  Surgeon: Juanito Doom, MD;  Location: [facility];  Service: Cardiopulmonary;  Laterality: Bilateral;    Family History  Problem Relation Age of Onset  . Heart attack Brother   . CAD Father   . Hypertension Father   . CAD Mother   . Hypertension Mother   . Hypertension Brother   . Stroke Neg Hx     Social History   Socioeconomic History  . Marital status: Married    Spouse name: Not on file  . Number of children: Not on file  . Years of education: Not on file  . Highest education level: Not on file  Occupational History  . Occupation: Retired  Scientific laboratory technician  . Financial resource strain: Not on file  . Food insecurity    Worry: Not on file    Inability: Not on file  . Transportation needs    Medical: Not on file    Non-medical: Not on file  Tobacco Use  . Smoking status: Current Every Day Smoker    Packs/day: 1.00    Years: 55.00    Pack years: 55.00    Types: Cigarettes    Last attempt to quit: 10/12/2016    Years since quitting: 2.1  . Smokeless tobacco: Never Used  . Tobacco comment: always ready to quit but does not work out  Substance and Sexual Activity  . Alcohol use: Yes    Alcohol/week: 0.0 standard drinks    Comment: "might have ETOH once or twice a month"  . Drug use: No  . Sexual activity: Not Currently  Lifestyle  . Physical activity    Days per week: Not on file    Minutes per session: Not on file  . Stress: Not on file  Relationships  . Social Herbalist on phone: Not on file    Gets together: Not on file    Attends religious service: Not on file    Active member of club or organization: Not on file    Attends meetings of clubs or organizations: Not on file    Relationship status: Not on file  . Intimate partner violence    Fear of current or ex partner: Not on file    Emotionally abused: Not on file    Physically abused: Not on file  Forced sexual activity: Not on file  Other Topics Concern  . Not on file  Social History Narrative  . Not on file    Review of Systems  Constitutional: Negative.   HENT: Negative.   Eyes: Negative.   Respiratory: Positive for cough. Negative for chest tightness and shortness of breath.   Cardiovascular: Negative.   Gastrointestinal: Negative.   Genitourinary: Negative.   Psychiatric/Behavioral: Negative.     Vitals:   12/16/18 1333 12/16/18 1334  BP:  120/70  Pulse:  65  Temp: 98.2 F (36.8 C)   SpO2:  95%     Physical Exam  Constitutional: He is oriented to person, place, and time. He appears well-developed and well-nourished.  HENT:  Head: Normocephalic and atraumatic.  Eyes: Pupils are equal, round, and reactive to light. Right eye exhibits no discharge. Left eye exhibits no discharge.  Neck: Normal range of motion. Neck supple. No tracheal deviation present. No thyromegaly present.  Cardiovascular: Normal rate, regular rhythm and normal heart sounds.  Pulmonary/Chest: Effort normal and breath sounds normal. No respiratory distress. He has no rales.  Bilateral rhonchi  Abdominal: Soft. Bowel sounds are normal. He exhibits no distension. There is no abdominal tenderness.  Musculoskeletal: Normal range of motion.        General: No edema.  Neurological: He is alert and oriented to person, place, and time. No cranial nerve deficit.  Skin: Skin is warm and dry. No rash noted. No erythema.  Psychiatric: He has a normal mood and affect.   Data Reviewed: Chest x-ray reviewed showing no acute infiltrate-performed 12/15/2018 CT scan performed 12/17/2017 significant for emphysema-reviewed  Assessment:  Hemoptysis -Limited hemoptysis -There was no increase in cough, he did not appear as he was bringing blood up from his lungs -May have had an oral bleed -Denies epistaxis -He is on aspirin and Xarelto  Chronic obstructive pulmonary disease  Active smoker   Plan/Recommendations:  Observation  Smoking cessation counseling  Encouraged to give me a call if he has any recurrence of hemoptysis, bronchoscopy may be justifiable at that time  I will see him back in the office in about 3 months  Encouraged to call with any significant concerns   Sherrilyn Rist MD [facility] 12/16/2018, 2:07 PM  CC: Hoyt Koch, *

## 2018-12-16 NOTE — Telephone Encounter (Signed)
Patient had a televisit yesterday with Aaron Edelman and was seen by Dr. Ander Slade today. Will close this encounter.

## 2018-12-16 NOTE — Patient Instructions (Signed)
Hemoptysis Very transient, resolved at present  No indication to take a look in your breathing tube at present  Call if any recurrence and we can plan for a bronchoscopy as needed  Continue your current inhalers  I will see you back in the office in about 3 months

## 2018-12-19 ENCOUNTER — Telehealth: Payer: Self-pay | Admitting: Pulmonary Disease

## 2018-12-19 MED ORDER — SPIRIVA RESPIMAT 2.5 MCG/ACT IN AERS: 2.0000 | INHALATION_SPRAY | Freq: Every day | RESPIRATORY_TRACT | 3 refills | Status: DC

## 2018-12-19 NOTE — Telephone Encounter (Signed)
Spoke with pt, advise dhim that I would send in a Rx to [facility] for his Spiriva. He states he tried to request the Rx and the pharmacy never heard from [place]. I apologized and advised him that I didn't see a request in our system. Pt understood and nothing further is needed.

## 2018-12-20 ENCOUNTER — Other Ambulatory Visit: Payer: Self-pay

## 2018-12-20 DIAGNOSIS — I25810 Atherosclerosis of coronary artery bypass graft(s) without angina pectoris: Secondary | ICD-10-CM

## 2018-12-20 DIAGNOSIS — I5022 Chronic systolic (congestive) heart failure: Secondary | ICD-10-CM

## 2018-12-20 MED ORDER — CARVEDILOL 3.125 MG PO TABS: ORAL_TABLET | ORAL | 0 refills | Status: DC

## 2018-12-21 NOTE — Progress Notes (Signed)
Of note, had some difficulty with sedation and bradycardia with prior bronchoscopy for hemoptysis (which was unrevealing).  Best approach may be imaging alone.

## 2018-12-21 NOTE — Progress Notes (Signed)
Reviewed, agree 

## 2018-12-24 IMAGING — DX DG CERVICAL SPINE 2 OR 3 VIEWS
3 series · 3 of 3 positions shown · non-contrast
Comparison: CT 04/04/2017.

CLINICAL DATA: Neck pain stiffness.  No known injury.

EXAM:
CERVICAL SPINE - 2-3 VIEW

[c-spine lat]
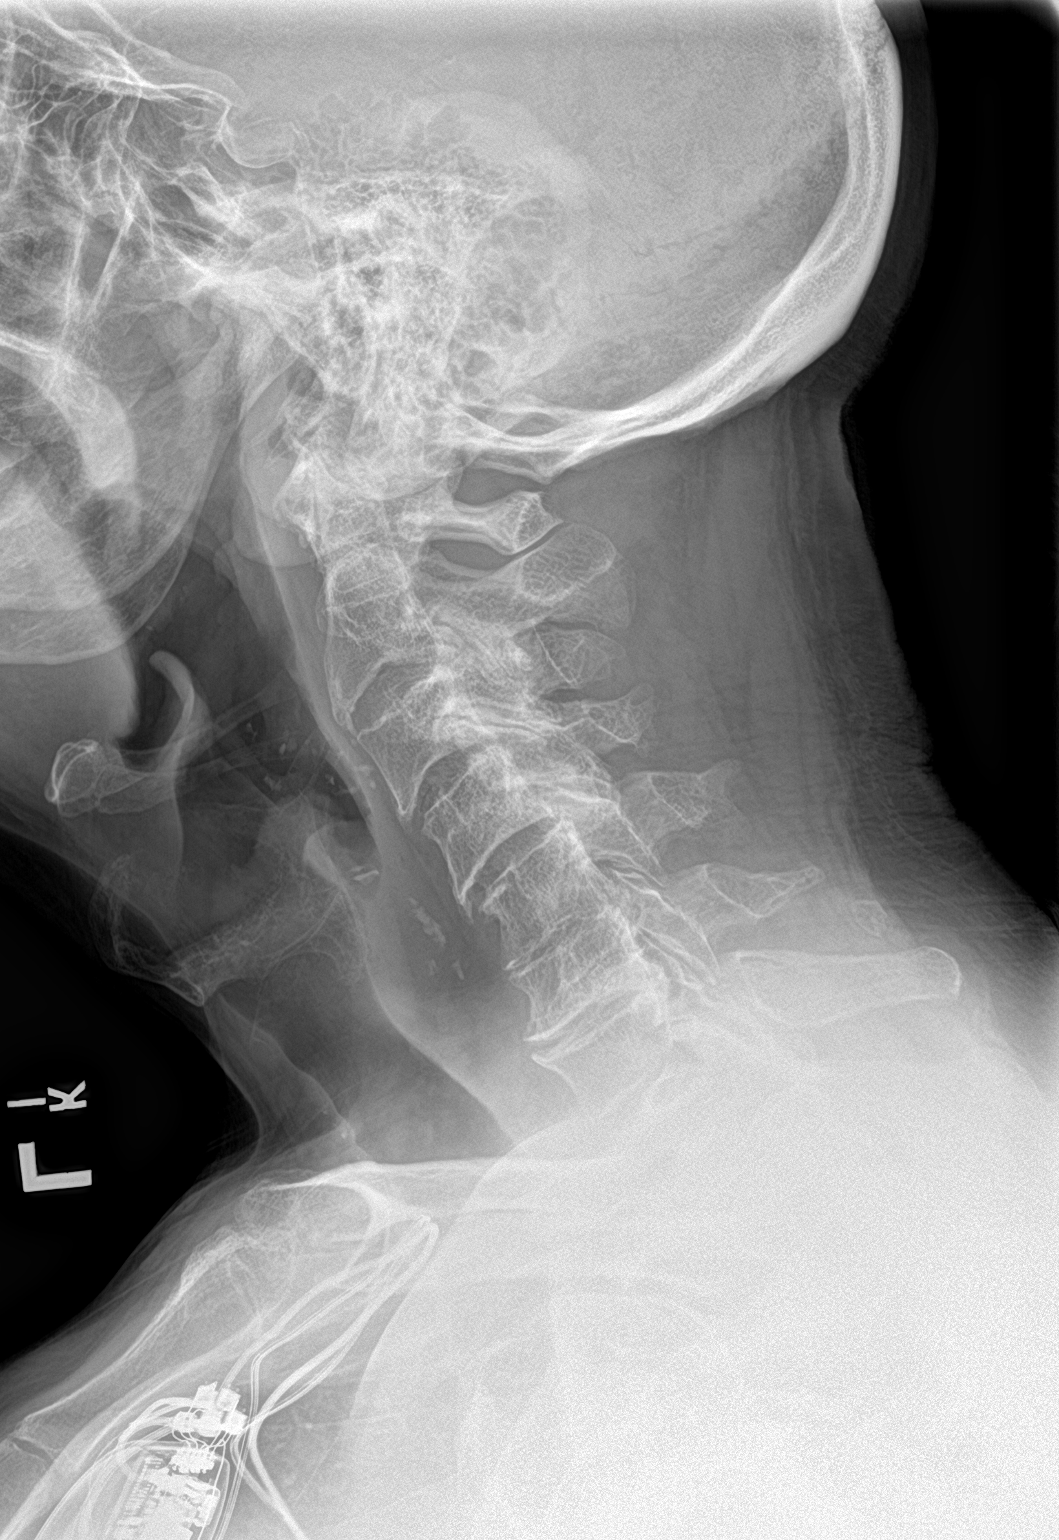

[c-spine ap]
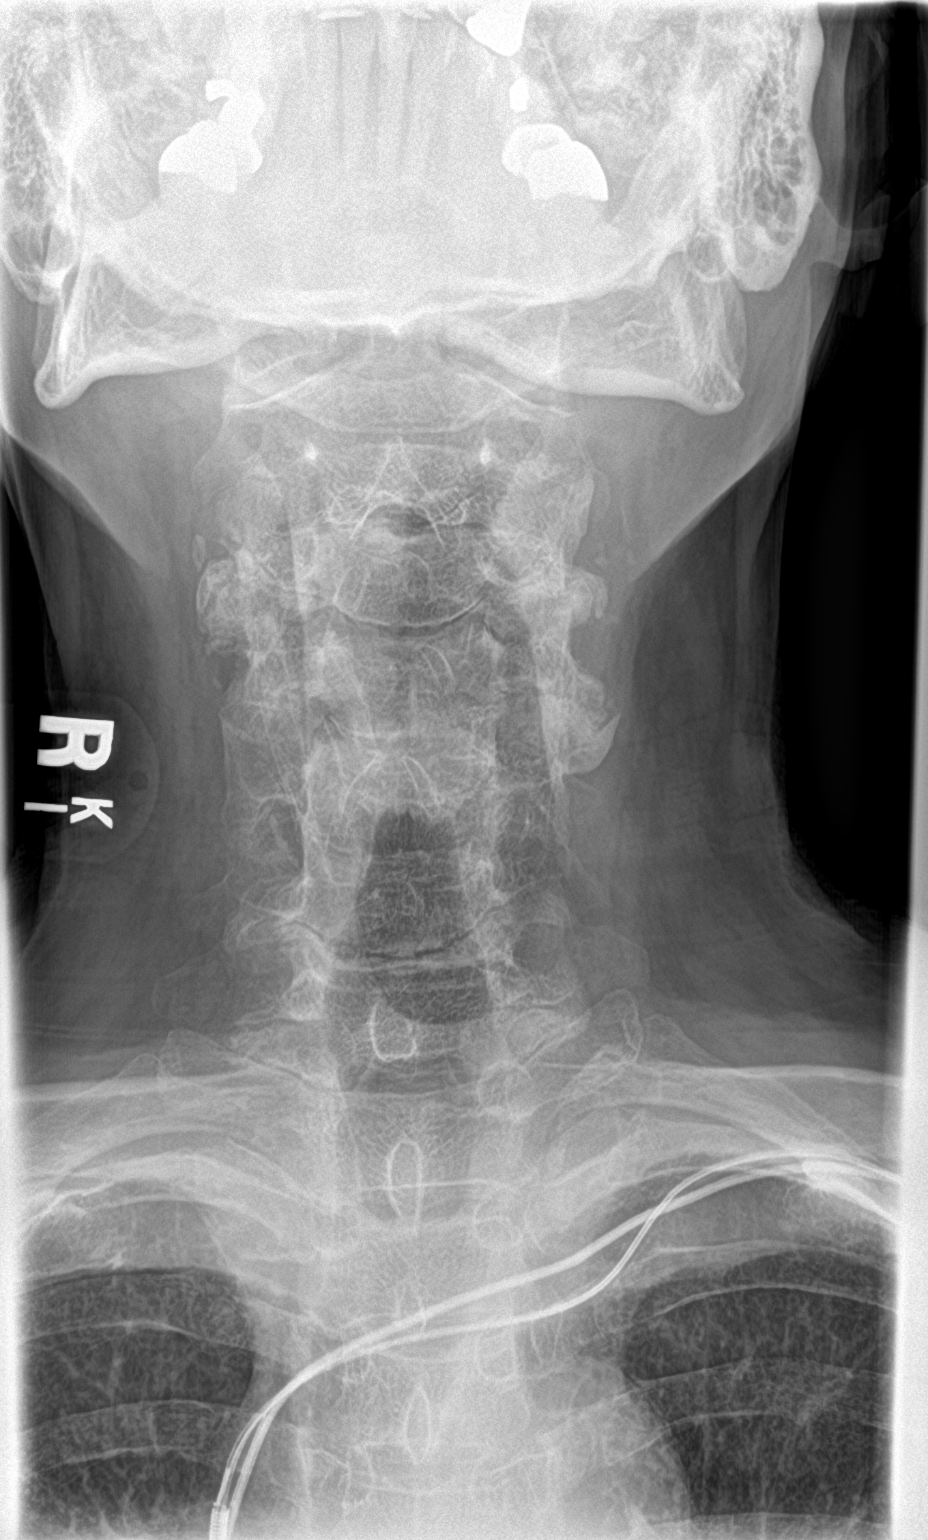

[c-spine open mouth]
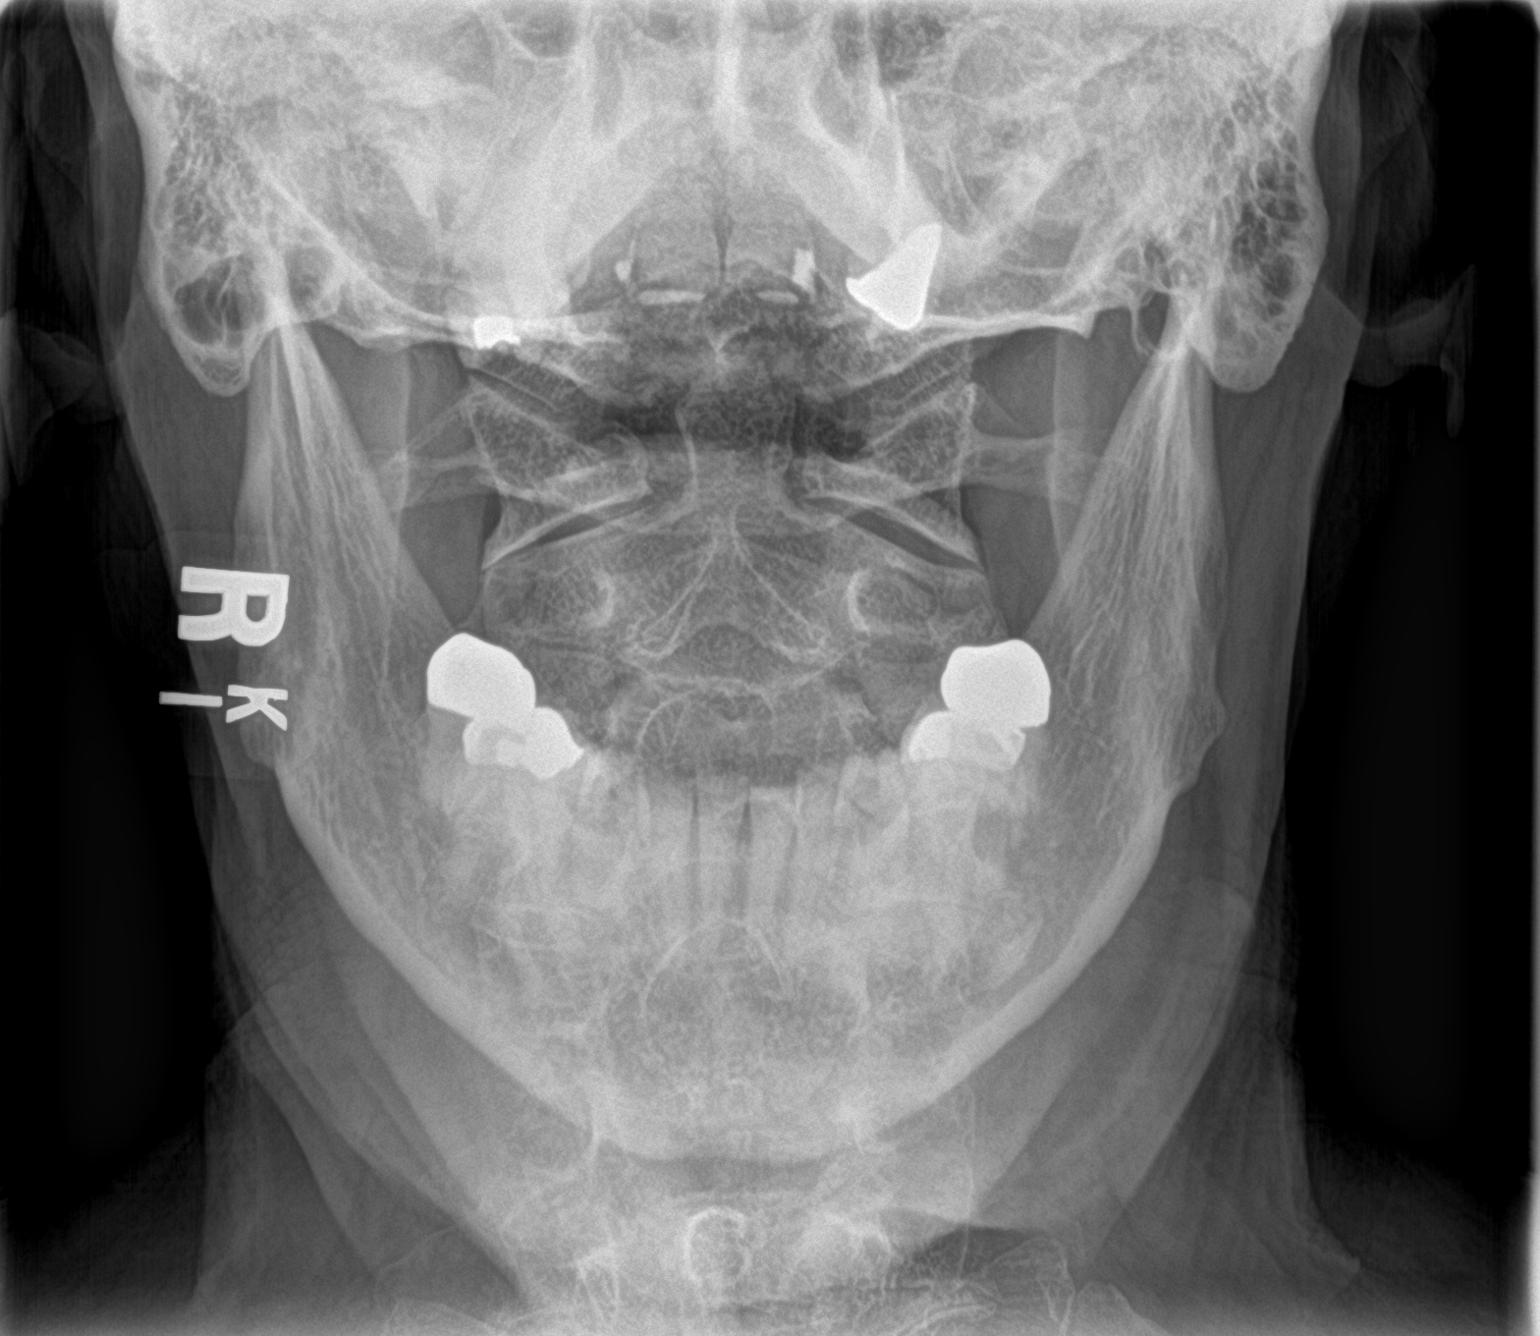

[3 of 3 positions shown; findings below may reference images not displayed]

FINDINGS: Diffuse multilevel degenerative change with loss of normal cervical
lordosis and 2 mm anterolisthesis C4 on C5. Disc space loss is most
prominent C5-C6. No acute bony abnormality identified. No evidence
of fracture. Carotid vascular calcification. Cardiac pacer noted.
IMPRESSION: 1. Diffuse multilevel degenerative change with loss of normal
cervical lordosis and 2 mm anterolisthesis C4 on C5. Disc space loss
is most prominent C5-C6. No acute abnormality.

2.  Carotid vascular disease.

## 2018-12-27 DIAGNOSIS — S62397A Other fracture of fifth metacarpal bone, left hand, initial encounter for closed fracture: Secondary | ICD-10-CM | POA: Diagnosis not present

## 2018-12-29 ENCOUNTER — Encounter: Payer: Medicare Other | Admitting: *Deleted

## 2018-12-30 ENCOUNTER — Other Ambulatory Visit: Payer: Self-pay | Admitting: *Deleted

## 2018-12-30 ENCOUNTER — Other Ambulatory Visit: Payer: Self-pay | Admitting: Interventional Cardiology

## 2018-12-30 MED ORDER — BUPROPION HCL 75 MG PO TABS: ORAL_TABLET | ORAL | 0 refills | Status: DC

## 2018-12-30 NOTE — Telephone Encounter (Signed)
Patient requested refill

## 2019-01-05 ENCOUNTER — Encounter: Payer: Self-pay | Admitting: Cardiology

## 2019-01-12 ENCOUNTER — Ambulatory Visit (INDEPENDENT_AMBULATORY_CARE_PROVIDER_SITE_OTHER): Payer: Medicare Other | Admitting: *Deleted

## 2019-01-12 DIAGNOSIS — I480 Paroxysmal atrial fibrillation: Secondary | ICD-10-CM | POA: Diagnosis not present

## 2019-01-12 DIAGNOSIS — I472 Ventricular tachycardia, unspecified: Secondary | ICD-10-CM

## 2019-01-13 ENCOUNTER — Ambulatory Visit: Payer: Medicare Other | Admitting: Nurse Practitioner

## 2019-01-13 ENCOUNTER — Encounter: Payer: Medicare Other | Admitting: Nurse Practitioner

## 2019-01-14 LAB — CUP PACEART REMOTE DEVICE CHECK
Battery Remaining Longevity: 36 mo
Battery Remaining Percentage: 42 %
Brady Statistic RA Percent Paced: 5 %
Brady Statistic RV Percent Paced: 0 %
Date Time Interrogation Session: 20201015174100
HighPow Impedance: 50 Ohm
Implantable Lead Implant Date: 20110610
Implantable Lead Implant Date: 20110610
Implantable Lead Location: 753859
Implantable Lead Location: 753860
Implantable Lead Model: 185
Implantable Lead Model: 4135
Implantable Lead Serial Number: 28681386
Implantable Lead Serial Number: 339643
Implantable Pulse Generator Implant Date: 20110610
Lead Channel Impedance Value: 423 Ohm
Lead Channel Impedance Value: 633 Ohm
Lead Channel Pacing Threshold Amplitude: 0.9 V
Lead Channel Pacing Threshold Amplitude: 0.9 V
Lead Channel Pacing Threshold Pulse Width: 0.4 ms
Lead Channel Pacing Threshold Pulse Width: 0.4 ms
Lead Channel Setting Pacing Amplitude: 2 V
Lead Channel Setting Pacing Amplitude: 2 V
Lead Channel Setting Pacing Pulse Width: 0.4 ms
Lead Channel Setting Sensing Sensitivity: 0.6 mV
Pulse Gen Serial Number: 164892

## 2019-01-23 DIAGNOSIS — M545 Low back pain: Secondary | ICD-10-CM | POA: Diagnosis not present

## 2019-01-23 DIAGNOSIS — G8929 Other chronic pain: Secondary | ICD-10-CM | POA: Diagnosis not present

## 2019-01-25 NOTE — Progress Notes (Addendum)
Cardiology Office Note:    Date:  01/26/2019   ID:  Davell, Beckstead 22-Sep-1940, MRN [id number]  PCP:  [name], NP  Cardiologist:  Sinclair Grooms, MD   Referring MD: Hoyt Koch, *   Chief Complaint  Patient presents with  . Atrial Fibrillation  . Coronary Artery Disease  . Congestive Heart Failure    History of Present Illness:    Steven Guzman is a 78 y.o. male with a hx of ischemic cardiomyopathy with EF 35%, COPD, hypertension, recent low blood pressures, CAD with prior coronary bypass grafting, continued tobacco abuse, PAF, ventricular tachycardia controlled on amiodarone and Mexitil, and implantable cardiac defibrillator.  Mr. Hoadley is doing relatively well.  He continues cigarette smoking.  He denies angina.  He has not had orthopnea.  He did have lower extremity swelling for which he took an extra 20 mg of furosemide 2 days last week.  Things are back to normal now.  All his own, furosemide was decreased from 40 mg daily to 20 mg daily because he hated the frequent urination.  He has not had any difficulty with breathing as a result.  Past Medical History:  Diagnosis Date  . AICD (automatic cardioverter/defibrillator) present   . Aneurysm ([place])    a. Aneurysmal infrarenal aorta up to 33 mm on CT 10/2014, recommended f/u due 10/2017  . Anginal pain ([place])   . Anxiety   . Basal cell carcinoma of nose    S/P MOHS  . Biliary acute pancreatitis   . CAD (coronary artery disease)    a. s/p MI in 1994 with PCI to LAD at that time b. cath 10/2012 demonstrated EF 30%, inferior akinesis with mild hypokinesis of all walls, patent LAD and RCA stents; ostial PDA with 80-90% obstruction with medical therapy recommended   . Chronic systolic CHF (congestive heart failure) (HCC)    EF 30 to 35 % as of 09/2014.   [name] CKD (chronic kidney disease), stage III   . Complication of anesthesia 10/2014   "had to have defibrillator w/ERCP"  . COPD (chronic obstructive  pulmonary disease) ([place])    a. followed by pulmonary, COPD GOLD stage II  . Depression   . Diverticulosis of colon 07/2014   noted on CT  . GERD (gastroesophageal reflux disease)   . Hiatal hernia   . Hyperglycemia 10/2012.  [name] Hyperlipidemia   . Hypertension   . Myocardial infarction [facility]) 1994; 2011  . Pneumonia 1946; 2015  . Prostate enlargement 07/2014   observed on CT  . Tobacco abuse   . Ventricular tachycardia ([place])    a. 08/2009 s/p BSX E110 Teligen 100 AICD, ser#: [id number];  b. 08/2008 VT req ATP - detection reprogrammed from 160 to 150. c. EPS and VT ablation by Dr. Lovena Le 12/21/2014    Past Surgical History:  Procedure Laterality Date  . BIOPSY  12/21/2017   Procedure: BIOPSY;  Surgeon: [name], MD;  Location: [facility];  Service: Endoscopy;;  . CATARACT EXTRACTION W/ INTRAOCULAR LENS  IMPLANT, BILATERAL Bilateral ~ 2011  . COLONOSCOPY    . COLONOSCOPY WITH PROPOFOL N/A 12/21/2017   Procedure: COLONOSCOPY WITH PROPOFOL;  Surgeon: [name], MD;  Location: [facility];  Service: Endoscopy;  Laterality: N/A;  . ELECTROPHYSIOLOGIC STUDY N/A 12/21/2014   Procedure: V Tach Ablation;  Surgeon: [name], MD;  Location: [facility];  Service: Cardiovascular;  Laterality: N/A;  . ERCP N/A 11/16/2014  Procedure: ENDOSCOPIC RETROGRADE CHOLANGIOPANCREATOGRAPHY (ERCP);  Surgeon: [name], MD;  Location: [facility];  Service: Endoscopy;  Laterality: N/A;  . ESOPHAGOGASTRODUODENOSCOPY (EGD) WITH PROPOFOL N/A 12/21/2017   Procedure: ESOPHAGOGASTRODUODENOSCOPY (EGD) WITH PROPOFOL;  Surgeon: [name], MD;  Location: [facility];  Service: Endoscopy;  Laterality: N/A;  . EYE SURGERY    . FOOT SURGERY Left 2005   "fixed bone that stuck out in my ankle area"  . HEMORRHOID BANDING    . IMPLANTABLE CARDIOVERTER DEFIBRILLATOR IMPLANT  09/06/09   BSX dual chamber ICD implanted in [place] for cardiac arrest 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  . INGUINAL HERNIA REPAIR Right ~  1995  . LEFT HEART CATHETERIZATION WITH CORONARY ANGIOGRAM N/A 11/25/2012   demonstrated EF 30%, inferior akinesis with mild hypokinesis of all walls, patent LAD and RCA stents; ostial PDA with 80-90% obstruction with medical therapy recommended  . MOHS SURGERY  2008   nose, skin graft  . POLYPECTOMY  12/21/2017   Procedure: POLYPECTOMY;  Surgeon: [name], MD;  Location: [facility];  Service: Endoscopy;;  . RETINAL DETACHMENT SURGERY Right 2013  . TENOLYSIS Right 12/21/2013   Procedure: TENOLYSIS FLEXOR CARPI RADIALIS ,DEBRIDEMENT RIGHT JOINT WRIST,DEBRIDEMENT SCAPHOTRAPEZIAL TRAPEZOID, REPAIR OF EXTENSOR HOOD;  Surgeon: Daryll Brod, MD;  Location: [facility];  Service: Orthopedics;  Laterality: Right;  . V-TACH ABLATION  12/21/2014  . VIDEO BRONCHOSCOPY Bilateral 01/09/2016   Procedure: VIDEO BRONCHOSCOPY WITHOUT FLUORO;  Surgeon: Juanito Doom, MD;  Location: [facility];  Service: Cardiopulmonary;  Laterality: Bilateral;    Current Medications: Current Meds  Medication Sig  . albuterol (PROVENTIL) (2.5 MG/3ML) 0.083% nebulizer solution Take 3 mLs (2.5 mg total) by nebulization every 6 (six) hours as needed for wheezing or shortness of breath.  [name] amiodarone (PACERONE) 200 MG tablet TAKE 1 TABLET(200 MG) BY MOUTH DAILY  . Artificial Tear Solution (SOOTHE XP OP) Place 2 drops into both eyes daily as needed (dry eyes).  [name] aspirin EC 81 MG tablet Take 1 tablet (81 mg total) by mouth every Monday, Wednesday, and Friday.  [name] atorvastatin (LIPITOR) 80 MG tablet TAKE 1 TABLET(80 MG) BY MOUTH DAILY  . benazepril (LOTENSIN) 10 MG tablet TAKE 1 TABLET BY MOUTH ONCE DAILY  . benzonatate (TESSALON) 200 MG capsule Take 1 capsule (200 mg total) by mouth 3 (three) times daily as needed for cough.  . budesonide-formoterol (SYMBICORT) 160-4.5 MCG/ACT inhaler Inhale 2 puffs into the lungs 2 (two) times daily.  [name] buPROPion (WELLBUTRIN) 75 MG tablet TAKE 1 TABLET(75 MG) BY MOUTH DAILY   . busPIRone (BUSPAR) 15 MG tablet Take 15 mg by mouth 2 (two) times daily. Take with meals  . carvedilol (COREG) 3.125 MG tablet TAKE 1 TABLET(3.125 MG) BY MOUTH TWICE DAILY  . Choline Fenofibrate (FENOFIBRIC ACID) 135 MG CPDR TAKE 1 CAPSULE BY MOUTH DAILY  . Cyanocobalamin (B-12) 1000 MCG/ML KIT Inject 1,000 mcg as directed every 30 (thirty) days.  . Cyanocobalamin (RA VITAMIN B-12) 1000 MCG/ML LIQD Take 1,000 mcg by mouth daily.  [name] docusate sodium (COLACE) 100 MG capsule Take 100 mg by mouth 2 (two) times daily.  [name] escitalopram (LEXAPRO) 20 MG tablet TAKE 1 TABLET(20 MG) BY MOUTH DAILY  . fluticasone (FLONASE) 50 MCG/ACT nasal spray INSTILL 2 SPRAYS INTO EACH NOSTRIL QD  . furosemide (LASIX) 40 MG tablet TAKE 1/2 TABLET(20 MG) BY MOUTH DAILY  . Ipratropium-Albuterol (COMBIVENT RESPIMAT) 20-100 MCG/ACT AERS respimat Inhale 1 puff into the lungs every 6 (six) hours.  Shortness of breath or wheezing  . IRON PO Take by mouth daily.   [name] MAGNESIUM-OXIDE 400 (241.3 Mg) MG tablet TAKE 1 TABSULE BY MOUTH DAILY  . mexiletine (MEXITIL) 200 MG capsule TAKE 1 CAPSULE(200 MG) BY MOUTH TWICE DAILY  . Multiple Vitamins-Minerals (CENTRUM ADULTS PO) Take by mouth daily.  . nitroGLYCERIN (NITROSTAT) 0.4 MG SL tablet DISSOLVE 1 TABLET UNDER THE TONGUE EVERY 5 MINUTES FOR 3 DOSES AS NEEDED FOR CHEST PAIN  . omeprazole (PRILOSEC) 20 MG capsule Take 20 mg by mouth daily.   . potassium chloride SA (K-DUR) 20 MEQ tablet TAKE 1 TABLET(20 MEQ) BY MOUTH DAILY  . Respiratory Therapy Supplies (FLUTTER) DEVI Use as directed  . senna (SENOKOT) 8.6 MG tablet Take 1-2 tablets by mouth daily.  . tamsulosin (FLOMAX) 0.4 MG CAPS capsule TAKE 1 CAPSULE(0.4 MG) BY MOUTH DAILY AFTER SUPPER  . Tiotropium Bromide Monohydrate (SPIRIVA RESPIMAT) 2.5 MCG/ACT AERS Inhale 2 puffs into the lungs daily.  Alveda Reasons 20 MG TABS tablet TAKE 1 TABLET BY MOUTH EVERY DAY WITH SUPPER     Allergies:   Sulfa antibiotics   Social History    Socioeconomic History  . Marital status: Married    Spouse name: Not on file  . Number of children: Not on file  . Years of education: Not on file  . Highest education level: Not on file  Occupational History  . Occupation: Retired  Scientific laboratory technician  . Financial resource strain: Not on file  . Food insecurity    Worry: Not on file    Inability: Not on file  . Transportation needs    Medical: Not on file    Non-medical: Not on file  Tobacco Use  . Smoking status: Current Every Day Smoker    Packs/day: 1.00    Years: 55.00    Pack years: 55.00    Types: Cigarettes    Last attempt to quit: 10/12/2016    Years since quitting: 2.2  . Smokeless tobacco: Never Used  . Tobacco comment: always ready to quit but does not work out  Substance and Sexual Activity  . Alcohol use: Yes    Alcohol/week: 0.0 standard drinks    Comment: "might have ETOH once or twice a month"  . Drug use: No  . Sexual activity: Not Currently  Lifestyle  . Physical activity    Days per week: Not on file    Minutes per session: Not on file  . Stress: Not on file  Relationships  . Social Herbalist on phone: Not on file    Gets together: Not on file    Attends religious service: Not on file    Active member of club or organization: Not on file    Attends meetings of clubs or organizations: Not on file    Relationship status: Not on file  Other Topics Concern  . Not on file  Social History Narrative  . Not on file     Family History: The patient's family history includes CAD in his father and mother; Heart attack in his brother; Hypertension in his brother, father, and mother. There is no history of Stroke.  ROS:   Please see the history of present illness.    Continues to smoke cigarettes.  Not sleeping well.  Unable to control sweets in his diet.  All other systems reviewed and are negative.  EKGs/Labs/Other Studies Reviewed:    The following studies were reviewed today: No prior cardiac  evaluation.  EKG:  EKG not repeated.  Recent Labs: 09/28/2018: Magnesium 1.7; TSH 1.97 12/15/2018: ALT 25; BUN 12; Creatinine, Ser 1.05; Hemoglobin 13.3; Platelets 191; Potassium 4.0; Sodium 138  Recent Lipid Panel    Component Value Date/Time   CHOL 157 09/21/2017 1546   TRIG 79 09/21/2017 1546   HDL 46 09/21/2017 1546   CHOLHDL 3.4 09/21/2017 1546   CHOLHDL 3 12/29/2016 1618   VLDL 16.6 12/29/2016 1618   LDLCALC 95 09/21/2017 1546    Physical Exam:    VS:  BP 104/74   Pulse 66   Ht _0  (1.803 m)   Wt 188 lb 12.8 oz (85.6 kg)   SpO2 94%   BMI 26.33 kg/m     Wt Readings from Last 3 Encounters:  01/26/19 188 lb 12.8 oz (85.6 kg)  12/16/18 183 lb 6.4 oz (83.2 kg)  12/14/18 183 lb 6.4 oz (83.2 kg)     GEN: Slender. No acute distress HEENT: Normal NECK: No JVD. LYMPHATICS: No lymphadenopathy CARDIAC:  RRR without murmur, gallop, or edema. VASCULAR: Diminished pulses in both feet. RESPIRATORY:  Clear to auscultation without rales, wheezing or rhonchi  ABDOMEN: Soft, non-tender, non-distended, No pulsatile mass, MUSCULOSKELETAL: No deformity  SKIN: Warm and dry NEUROLOGIC:  Alert and oriented x 3 PSYCHIATRIC:  Normal affect   ASSESSMENT:    1. Coronary artery disease involving native coronary artery of native heart without angina pectoris   2. Chronic systolic heart failure ([place])   3. Ventricular tachycardia (HCC)   4. Paroxysmal atrial fibrillation ([place])   5. ICD (implantable cardioverter-defibrillator) in place   6. Essential hypertension   7. Other hyperlipidemia   8. Long term current use of amiodarone   9. Chronic anticoagulation   10. Educated about COVID-19 virus infection    PLAN:    In order of problems listed above:  1. He is stable without angina.  Secondary prevention is discussed. 2. He is self adjusting diuretic regimen.  He is euvolemic currently on 20 mg of furosemide per day.  We will not increase intensity unless he finds frequent  necessity to take extra furosemide. 3. No AICD discharges. 4. No episodes of tachycardia. 5. AICD is followed by Dr. Lovena Le. 6. Blood pressure is low normal. 7. He is on moderate intensity statin therapy with reasonable LDL.  Most recent was 95 in June 2019.  Higher intensity statin therapy causes musculoskeletal discomfort.  Target is less than 70. 8. Amiodarone therapy with recent TSH and liver panel within the past 3 months. 9. Rivaroxaban therapy without bleeding complications.  He will need to pause therapy prior to spine injections that are planned for later this fall. 10. W3 is discussed and endorsed as lifestyle practices.  Overall education and awareness concerning secondary risk prevention was discussed in detail: LDL less than 70, hemoglobin A1c less than 7, blood pressure target less than 130/80 mmHg, >150 minutes of moderate aerobic activity per week, avoidance of smoking, weight control (via diet and exercise), and continued surveillance/management of/for obstructive sleep apnea.    Medication Adjustments/Labs and Tests Ordered: Current medicines are reviewed at length with the patient today.  Concerns regarding medicines are outlined above.  No orders of the defined types were placed in this encounter.  No orders of the defined types were placed in this encounter.   Patient Instructions  Medication Instructions:  Your physician recommends that you continue on your current medications as directed. Please refer to the Current Medication list given to you today.  *  If you need a refill on your cardiac medications before your next appointment, please call your pharmacy*  Lab Work: None If you have labs (blood work) drawn today and your tests are completely normal, you will receive your results only by: [name] MyChart Message (if you have MyChart) OR . A paper copy in the mail If you have any lab test that is abnormal or we need to change your treatment, we will call you to review  the results.  Testing/Procedures: None  Follow-Up: At [facility], you and your health needs are our priority.  As part of our continuing mission to provide you with exceptional heart care, we have created designated Provider Care Teams.  These Care Teams include your primary Cardiologist (physician) and Advanced Practice Providers (APPs -  Physician Assistants and Nurse Practitioners) who all work together to provide you with the care you need, when you need it.  Your next appointment:   6 months  The format for your next appointment:   In Person  Provider:   You may see Sinclair Grooms, MD or one of the following Advanced Practice Providers on your designated Care Team:    Truitt Merle, NP  Cecilie Kicks, NP  Kathyrn Drown, NP   Other Instructions      Signed, Sinclair Grooms, MD  01/26/2019 3:56 PM    [place]

## 2019-01-26 ENCOUNTER — Other Ambulatory Visit: Payer: Self-pay

## 2019-01-26 ENCOUNTER — Ambulatory Visit (INDEPENDENT_AMBULATORY_CARE_PROVIDER_SITE_OTHER): Payer: Medicare Other | Admitting: Interventional Cardiology

## 2019-01-26 ENCOUNTER — Encounter: Payer: Self-pay | Admitting: Interventional Cardiology

## 2019-01-26 VITALS — BP 104/74 | HR 66 | Ht 71.0 in | Wt 188.8 lb

## 2019-01-26 DIAGNOSIS — Z95810 Presence of automatic (implantable) cardiac defibrillator: Secondary | ICD-10-CM | POA: Diagnosis not present

## 2019-01-26 DIAGNOSIS — Z7189 Other specified counseling: Secondary | ICD-10-CM | POA: Diagnosis not present

## 2019-01-26 DIAGNOSIS — Z7901 Long term (current) use of anticoagulants: Secondary | ICD-10-CM | POA: Diagnosis not present

## 2019-01-26 DIAGNOSIS — I255 Ischemic cardiomyopathy: Secondary | ICD-10-CM | POA: Diagnosis not present

## 2019-01-26 DIAGNOSIS — I480 Paroxysmal atrial fibrillation: Secondary | ICD-10-CM | POA: Diagnosis not present

## 2019-01-26 DIAGNOSIS — Z79899 Other long term (current) drug therapy: Secondary | ICD-10-CM

## 2019-01-26 DIAGNOSIS — I472 Ventricular tachycardia, unspecified: Secondary | ICD-10-CM

## 2019-01-26 DIAGNOSIS — E7849 Other hyperlipidemia: Secondary | ICD-10-CM

## 2019-01-26 DIAGNOSIS — I2510 Atherosclerotic heart disease of native coronary artery without angina pectoris: Secondary | ICD-10-CM

## 2019-01-26 DIAGNOSIS — I10 Essential (primary) hypertension: Secondary | ICD-10-CM

## 2019-01-26 DIAGNOSIS — I5022 Chronic systolic (congestive) heart failure: Secondary | ICD-10-CM

## 2019-01-26 NOTE — Patient Instructions (Signed)

## 2019-01-26 NOTE — Progress Notes (Signed)
Remote ICD transmission.   

## 2019-01-31 ENCOUNTER — Telehealth: Payer: Self-pay | Admitting: Interventional Cardiology

## 2019-01-31 NOTE — Telephone Encounter (Signed)
It was good in the office recently. Make sure recordings are done at least 2 hours after medications are taken in the morning. Also, diuretics help lower BP. Is he taking as prescribed.

## 2019-01-31 NOTE — Telephone Encounter (Signed)
Spoke with pt and pt noted yesterday having a h/a Pt checked B/P multiple times and was elevated ranging 126-160/69-80  Pt had taken an extra 1/2 tab of Benazepril and an extra Lasix yesterday evening  on his own with some decrease in B/P but crept back up later during the night Per pt last year both of these meds were cut in half  due to low B/P . Pt checked B/P this am and readings were 171/95 and 178/96 these values are before meds and pt has not taken his am meds as of yet encouraged to take meds and check B/P afterwards Also per pt no increase in salt intake .Will forward to Dr Tamala Julian for review and recommendations ./cy

## 2019-01-31 NOTE — Telephone Encounter (Signed)
New Message    Pt is calling about his BP     Pt c/o BP issue: STAT if pt c/o blurred vision, one-sided weakness or slurred speech  1. What are your last 5 BP readings? This morning at 10:51am 171/95  Second time 178/96  Last night it was 160/82    2. Are you having any other symptoms (ex. Dizziness, headache, blurred vision, passed out)? Headache   3. What is your BP issue? Pt is calling because he has an elevated BP and headache

## 2019-02-01 ENCOUNTER — Other Ambulatory Visit: Payer: Self-pay | Admitting: Internal Medicine

## 2019-02-01 NOTE — Telephone Encounter (Signed)
Spoke with pt and he is taking all meds, including diuretics, as prescribed.  Advised pt to monitor BP at least 2 hours after morning medications and call or MyChart those readings to me next week.  Pt verbalized understanding and was in agreement with this plan.

## 2019-02-13 ENCOUNTER — Other Ambulatory Visit: Payer: Self-pay | Admitting: Internal Medicine

## 2019-02-27 ENCOUNTER — Ambulatory Visit (INDEPENDENT_AMBULATORY_CARE_PROVIDER_SITE_OTHER): Payer: Medicare Other | Admitting: Nurse Practitioner

## 2019-02-27 ENCOUNTER — Other Ambulatory Visit: Payer: Self-pay

## 2019-02-27 ENCOUNTER — Encounter: Payer: Self-pay | Admitting: Nurse Practitioner

## 2019-02-27 VITALS — BP 110/70 | HR 62 | Temp 97.5°F | Ht 71.0 in | Wt 189.0 lb

## 2019-02-27 VITALS — BP 110/70 | HR 62 | Temp 97.5°F | Ht 71.0 in | Wt 189.4 lb

## 2019-02-27 DIAGNOSIS — E7849 Other hyperlipidemia: Secondary | ICD-10-CM | POA: Diagnosis not present

## 2019-02-27 DIAGNOSIS — R634 Abnormal weight loss: Secondary | ICD-10-CM

## 2019-02-27 DIAGNOSIS — I255 Ischemic cardiomyopathy: Secondary | ICD-10-CM

## 2019-02-27 DIAGNOSIS — I480 Paroxysmal atrial fibrillation: Secondary | ICD-10-CM | POA: Diagnosis not present

## 2019-02-27 DIAGNOSIS — I10 Essential (primary) hypertension: Secondary | ICD-10-CM | POA: Diagnosis not present

## 2019-02-27 DIAGNOSIS — E538 Deficiency of other specified B group vitamins: Secondary | ICD-10-CM | POA: Diagnosis not present

## 2019-02-27 DIAGNOSIS — I5022 Chronic systolic (congestive) heart failure: Secondary | ICD-10-CM | POA: Diagnosis not present

## 2019-02-27 DIAGNOSIS — Z0000 Encounter for general adult medical examination without abnormal findings: Secondary | ICD-10-CM

## 2019-02-27 DIAGNOSIS — J449 Chronic obstructive pulmonary disease, unspecified: Secondary | ICD-10-CM | POA: Diagnosis not present

## 2019-02-27 DIAGNOSIS — M545 Low back pain, unspecified: Secondary | ICD-10-CM

## 2019-02-27 DIAGNOSIS — I2510 Atherosclerotic heart disease of native coronary artery without angina pectoris: Secondary | ICD-10-CM

## 2019-02-27 MED ORDER — VITAMIN B-12 1000 MCG PO TABS: 1000.0000 ug | ORAL_TABLET | Freq: Every day | ORAL | 3 refills | Status: DC
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
## 2019-02-27 NOTE — Progress Notes (Signed)
Subjective:   BENJIMAN SEDGWICK is a 78 y.o. male who presents for Medicare Annual/Subsequent preventive examination.  Review of Systems:   Cardiac Risk Factors include: smoking/ tobacco exposure;advanced age (>62mn, >>48women);dyslipidemia;hypertension;sedentary lifestyle;family history of premature cardiovascular disease     Objective:    Vitals: BP 110/70   Pulse 62   Temp (!) 97.5 F (36.4 C) (Temporal)   Ht _0  (1.803 m)   Wt 189 lb 6.4 oz (85.9 kg)   BMI 26.42 kg/m   Body mass index is 26.42 kg/m.  Advanced Directives 02/27/2019 02/27/2019 12/14/2018 11/26/2018 11/18/2018 03/15/2018 12/21/2017  Does Patient Have a Medical Advance Directive? _1  No No  Does patient want to make changes to medical advance directive? - - - - - - -  Would patient like information on creating a medical advance directive? Yes (MAU/Ambulatory/Procedural Areas - Information given) Yes (MAU/Ambulatory/Procedural Areas - Information given) No - Patient declined - - No - Patient declined No - Patient declined    Tobacco Social History   Tobacco Use  Smoking Status Current Every Day Smoker  . Packs/day: 1.00  . Years: 55.00  . Pack years: 55.00  . Types: Cigarettes  Smokeless Tobacco Never Used  Tobacco Comment   always ready to quit but does not work out     Ready to quit: Not Answered Counseling given: Not Answered Comment: always ready to quit but does not work out   Clinical Intake:  Pre-visit preparation completed: Yes  Pain : No/denies pain        How often do you need to have someone help you when you read instructions, pamphlets, or other written materials from your doctor or pharmacy?: 1 - Never What is the last grade level you completed in school?: college  Interpreter Needed?: No     Past Medical History:  Diagnosis Date  . AICD (automatic cardioverter/defibrillator) present   . [place]neurys[place] ([place]    a. Aneurysmal infrarenal aorta up to 33 mm on CT  10/2014, recommended f/u due 10/2017  . Anginal pain ([facility]   . Anxiety   . Basal cell carcinoma of nose    S/P MOHS  . Biliary acute pancreatitis   . CAD (coronary artery disease)    a. s/p MI in 1994 with PCI to LAD at that time b. cath 10/2012 demonstrated EF 30%, inferior akinesis with mild hypokinesis of all walls, patent LAD and RCA stents; ostial PDA with 80-90% obstruction with medical therapy recommended   . Chronic systolic CHF (congestive heart failure) (HCC)    EF 30 to 35 % as of 09/2014.   .Marland KitchenCKD (chronic kidney disease), stage III   . Complication of anesthesia 10/2014   "had to have defibrillator w/ERCP"  . COPD (chronic obstructive pulmonary disease) ([facility]    a. followed by pulmonary, COPD GOLD stage II  . Depression   . Diverticulosis of colon 07/2014   noted on CT  . GERD (gastroesophageal reflux disease)   . Hiatal hernia   . Hyperglycemia 10/2012.  .Marland KitchenHyperlipidemia   . Hypertension   . Myocardial infarction ([facility] 1994; 2011  . Pneumonia 1946; 2015  . Prostate enlargement 07/2014   observed on CT  . Tobacco abuse   . [place]ricular tachycardia ([facility]    a. 08/2009 s/p BSX E110 Teligen 100 AICD, ser#: [id number]  b. 08/2008 VT req ATP - detection reprogrammed from 160 to 150. c. EPS and VT ablation by Dr. TLovena Le9/23/2016  Past Surgical History:  Procedure Laterality Date  . BIOPSY  12/21/2017   Procedure: BIOPSY;  Surgeon: [name], MD;  Location: [facility];  Service: Endoscopy;;  . CATARACT EXTRACTION W/ INTRAOCULAR LENS  IMPLANT, BILATERAL Bilateral ~ 2011  . COLONOSCOPY    . COLONOSCOPY WITH PROPOFOL N/A 12/21/2017   Procedure: COLONOSCOPY WITH PROPOFOL;  Surgeon: [name], MD;  Location: [facility];  Service: Endoscopy;  Laterality: N/A;  . ELECTROPHYSIOLOGIC STUDY N/A 12/21/2014   Procedure: V Tach Ablation;  Surgeon: [name], MD;  Location: [facility];  Service: Cardiovascular;  Laterality: N/A;  . ERCP N/A 11/16/2014   Procedure: ENDOSCOPIC  RETROGRADE CHOLANGIOPANCREATOGRAPHY (ERCP);  Surgeon: [name], MD;  Location: [facility];  Service: Endoscopy;  Laterality: N/A;  . ESOPHAGOGASTRODUODENOSCOPY (EGD) WITH PROPOFOL N/A 12/21/2017   Procedure: ESOPHAGOGASTRODUODENOSCOPY (EGD) WITH PROPOFOL;  Surgeon: [name], MD;  [facility];  Service: Endoscopy;  Laterality: N/A;  . EYE SURGERY    . FOOT SURGERY Left 2005   "fixed bone that stuck out in my ankle area"  . HEMORRHOID BANDING    . IMPLANTABLE CARDIOVERTER DEFIBRILLATOR IMPLANT  09/06/09   BSX dual chamber ICD implanted in [place] for cardiac arrest 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  . INGUINAL HERNIA REPAIR Right ~ 1995  . LEFT HEART CATHETERIZATION WITH CORONARY ANGIOGRAM N/A 11/25/2012   demonstrated EF 30%, inferior akinesis with mild hypokinesis of all walls, patent LAD and RCA stents; ostial PDA with 80-90% obstruction with medical therapy recommended  . MOHS SURGERY  2008   nose, skin graft  . POLYPECTOMY  12/21/2017   Procedure: POLYPECTOMY;  Surgeon: [name], MD;  Location[facility];  Service: Endoscopy;;  . RETINAL DETACHMENT SURGERY Right 2013  . TENOLYSIS Right 12/21/2013   Procedure: TENOLYSIS FLEXOR CARPI RADIALIS ,DEBRIDEMENT RIGHT JOINT WRIST,DEBRIDEMENT SCAPHOTRAPEZIAL TRAPEZOID, REPAIR OF EXTENSOR HOOD;  Surgeon: Daryll Brod, MD;  Location: [place];  Service: Orthopedics;  Laterality: Right;  . V-TACH ABLATION  12/21/2014  . VIDEO BRONCHOSCOPY Bilateral 01/09/2016   Procedure: VIDEO BRONCHOSCOPY WITHOUT FLUORO;  Surgeon: Juanito Doom, MD;  [facility];  Service: Cardiopulmonary;  Laterality: Bilateral;   Family History  Problem Relation Age of Onset  . Heart attack Brother   . CAD Father   . Hypertension Father   . CAD Mother   . Hypertension Mother   . Hypertension Brother   . Stroke Neg Hx    Social History   Socioeconomic History  . Marital status: Married    Spouse name: Not on file  .  Number of children: Not on file  . Years of education: Not on file  . Highest education level: Not on file  Occupational History  . Occupation: Retired  Scientific laboratory technician  . Financial resource strain: Not on file  . Food insecurity    Worry: Not on file    Inability: Not on file  . Transportation needs    Medical: Not on file    Non-medical: Not on file  Tobacco Use  . Smoking status: Current Every Day Smoker    Packs/day: 1.00    Years: 55.00    Pack years: 55.00    Types: Cigarettes  . Smokeless tobacco: Never Used  . Tobacco comment: always ready to quit but does not work out  Substance and Sexual Activity  . Alcohol use: Yes    Alcohol/week: 0.0 standard drinks    Comment: "might have  ETOH once or twice a month"  . Drug use: No  . Sexual activity: Not Currently  Lifestyle  . Physical activity    Days per week: Not on file    Minutes per session: Not on file  . Stress: Not on file  Relationships  . Social Herbalist on phone: Not on file    Gets together: Not on file    Attends religious service: Not on file    Active member of club or organization: Not on file    Attends meetings of clubs or organizations: Not on file    Relationship status: Not on file  Other Topics Concern  . Not on file  Social History Narrative  . Not on file    Outpatient Encounter Medications as of 02/27/2019  Medication Sig  . albuterol (PROVENTIL) (2.5 MG/3ML) 0.083% nebulizer solution Take 3 mLs (2.5 mg total) by nebulization every 6 (six) hours as needed for wheezing or shortness of breath.  [name] amiodarone (PACERONE) 200 MG tablet TAKE 1 TABLET(200 MG) BY MOUTH DAILY  . Artificial Tear Solution (SOOTHE XP OP) Place 2 drops into both eyes daily as needed (dry eyes).  [name] aspirin EC 81 MG tablet Take 1 tablet (81 mg total) by mouth every Monday, Wednesday, and Friday.  [name] atorvastatin (LIPITOR) 80 MG tablet TAKE 1 TABLET(80 MG) BY MOUTH DAILY  . benazepril (LOTENSIN) 10 MG tablet TAKE  1 TABLET BY MOUTH ONCE DAILY  . benzonatate (TESSALON) 200 MG capsule Take 1 capsule (200 mg total) by mouth 3 (three) times daily as needed for cough.  . budesonide-formoterol (SYMBICORT) 160-4.5 MCG/ACT inhaler Inhale 2 puffs into the lungs 2 (two) times daily.  [name] buPROPion (WELLBUTRIN) 75 MG tablet TAKE 1 TABLET(75 MG) BY MOUTH DAILY  . busPIRone (BUSPAR) 15 MG tablet TAKE 1 TABLET(15 MG) BY MOUTH THREE TIMES DAILY  . carvedilol (COREG) 3.125 MG tablet TAKE 1 TABLET(3.125 MG) BY MOUTH TWICE DAILY  . Choline Fenofibrate (FENOFIBRIC ACID) 135 MG CPDR TAKE 1 CAPSULE BY MOUTH DAILY  . Cyanocobalamin (B-12) 1000 MCG/ML KIT Inject 1,000 mcg as directed every 30 (thirty) days.  . Cyanocobalamin (RA VITAMIN B-12) 1000 MCG/ML LIQD Take 1,000 mcg by mouth daily.  [name] docusate sodium (COLACE) 100 MG capsule Take 100 mg by mouth 2 (two) times daily.  [name] escitalopram (LEXAPRO) 20 MG tablet TAKE 1 TABLET(20 MG) BY MOUTH DAILY  . fluticasone (FLONASE) 50 MCG/ACT nasal spray INSTILL 2 SPRAYS INTO EACH NOSTRIL QD  . furosemide (LASIX) 40 MG tablet TAKE 1/2 TABLET(20 MG) BY MOUTH DAILY  . Ipratropium-Albuterol (COMBIVENT RESPIMAT) 20-100 MCG/ACT AERS respimat Inhale 1 puff into the lungs every 6 (six) hours. Shortness of breath or wheezing  . IRON PO Take by mouth daily.   [name] MAGNESIUM-OXIDE 400 (241.3 Mg) MG tablet TAKE 1 TABSULE BY MOUTH DAILY  . mexiletine (MEXITIL) 200 MG capsule TAKE 1 CAPSULE(200 MG) BY MOUTH TWICE DAILY  . Multiple Vitamins-Minerals (CENTRUM ADULTS PO) Take by mouth daily.  . nitroGLYCERIN (NITROSTAT) 0.4 MG SL tablet DISSOLVE 1 TABLET UNDER THE TONGUE EVERY 5 MINUTES FOR 3 DOSES AS NEEDED FOR CHEST PAIN  . omeprazole (PRILOSEC) 20 MG capsule Take 20 mg by mouth daily.   . potassium chloride SA (K-DUR) 20 MEQ tablet TAKE 1 TABLET(20 MEQ) BY MOUTH DAILY  . Respiratory Therapy Supplies (FLUTTER) DEVI Use as directed  . senna (SENOKOT) 8.6 MG tablet Take 1-2 tablets by mouth daily.  .  tamsulosin (FLOMAX) 0.4  MG CAPS capsule TAKE 1 CAPSULE(0.4 MG) BY MOUTH DAILY AFTER SUPPER  . Tiotropium Bromide Monohydrate (SPIRIVA RESPIMAT) 2.5 MCG/ACT AERS Inhale 2 puffs into the lungs daily.  Alveda Reasons 20 MG TABS tablet TAKE 1 TABLET BY MOUTH EVERY DAY WITH SUPPER   No facility-administered encounter medications on file as of 02/27/2019.     Activities of Daily Living In your present state of health, do you have any difficulty performing the following activities: 02/27/2019  Hearing? N  Vision? Y  Comment hx of detached retina  Difficulty concentrating or making decisions? Y  Walking or climbing stairs? N  Dressing or bathing? N  Doing errands, shopping? N  Preparing Food and eating ? N  Using the Toilet? N  In the past six months, have you accidently leaked urine? N  Do you have problems with loss of bowel control? N  Managing your Medications? N  Managing your Finances? N  Housekeeping or managing your Housekeeping? N  Some recent data might be hidden    Patient Care Team: [name], NP as PCP - General (Geriatric Medicine) Belva Crome, MD as PCP - Cardiology (Cardiology) [name], MD as PCP - Electrophysiology (Cardiology) Roel Cluck, MD as Referring Physician (Ophthalmology)   Assessment:   This is a routine wellness examination for Gwyndolyn Saxon.  Exercise Activities and Dietary recommendations Current Exercise Habits: Home exercise routine, Type of exercise: walking, Time (Minutes): 50, Frequency (Times/Week): 7, Weekly Exercise (Minutes/Week): 350, Intensity: Mild  Goals    . Quit Smoking       Fall Risk Fall Risk  02/27/2019 10/31/2018 02/15/2018 03/04/2017 02/17/2016  Falls in the past year? 1 1 0 No No  Comment - Emmi Telephone Survey: data to providers prior to load Emmi Telephone Survey: data to providers prior to load Emmi Telephone Survey: data to providers prior to load -  Number falls in past yr: 0 1 - - -  Comment - Emmi  Telephone Survey Actual Response = 1 - - -  Injury with Fall? 1 1 - - -  Comment Broke ribs and bone in hand - - - -  Risk for fall due to : History of fall(s) - - - -   Is the patient's home free of loose throw rugs in walkways, pet beds, electrical cords, etc?   no      Grab bars in the bathroom? yes      Handrails on the stairs?   no stairs       Adequate lighting?   yes  Timed Get Up and Go Performed: 3 sec  Depression Screen PHQ 2/9 Scores 02/27/2019 02/17/2016 02/05/2015  PHQ - 2 Score 0 0 0    Cognitive Function MMSE - Mini Mental State Exam 02/27/2019  Orientation to time 3  Orientation to Place 5  Registration 3  Attention/ Calculation 5  Recall 3  Language- name 2 objects 2  Language- repeat 1  Language- follow 3 step command 3  Language- read & follow direction 1  Write a sentence 1  Copy design 1  Total score 28        Immunization History  Administered Date(s) Administered  . Fluad Quad(high Dose 65+) 12/14/2018  . Influenza, High Dose Seasonal PF 12/17/2012, 01/06/2016, 12/21/2016  . Influenza,inj,Quad PF,6+ Mos 12/25/2013, 12/22/2014  . Pneumococcal Conjugate-13 05/11/2014  . Pneumococcal-Unspecified 10/17/2010  . Tdap 05/11/2014  . Zoster 03/30/2008    Qualifies for Shingles Vaccine? Yes   Screening Tests  Health Maintenance  Topic Date Due  . TETANUS/TDAP  05/11/2024  . INFLUENZA VACCINE  Completed  . PNA vac Low Risk Adult  Completed   Cancer Screenings: Lung: Low Dose CT Chest recommended if Age 60-80 years, 30 pack-year currently smoking OR have quit w/in 15years. Patient does qualify. Had one in 2019- does not wish to have screening, would like to defer to pulmonary.  Colorectal: aged out  Additional Screenings:  Hepatitis C Screening: na      Plan:     I have personally reviewed and noted the following in the patient's chart:   . Medical and social history . Use of alcohol, tobacco or illicit drugs  . Current medications and  supplements . Functional ability and status . Nutritional status . Physical activity . Advanced directives . List of other physicians . Hospitalizations, surgeries, and ER visits in previous 12 months . Vitals . Screenings to include cognitive, depression, and falls . Referrals and appointments  In addition, I have reviewed and discussed with patient certain preventive protocols, quality metrics, and best practice recommendations. A written personalized care plan for preventive services as well as general preventive health recommendations were provided to patient.     [name], NP  02/27/2019

## 2019-02-27 NOTE — Patient Instructions (Signed)
Steven Guzman , Thank you for taking time to come for your Medicare Wellness Visit. I appreciate your ongoing commitment to your health goals. Please review the following plan we discussed and let me know if I can assist you in the future.   Screening recommendations/referrals: Colonoscopy up to date Recommended yearly ophthalmology/optometry visit for glaucoma screening and checkup Recommended yearly dental visit for hygiene and checkup  Vaccinations: Influenza vaccine up to date Pneumococcal vaccine up to date Tdap vaccine up to date Shingles vaccine -DUE FOR [place] vaccine- TO get at your local pharmacy     Advanced directives: to complete and bring back once completed and notarized   Conditions/risks identified: cardiovascular risk due to smoking and hx.   Next appointment:1 year  Preventive Care 73 Years and Older, Male Preventive care refers to lifestyle choices and visits with your health care provider that can promote health and wellness. What does preventive care include?  A yearly physical exam. This is also called an annual well check.  Dental exams once or twice a year.  Routine eye exams. Ask your health care provider how often you should have your eyes checked.  Personal lifestyle choices, including:  Daily care of your teeth and gums.  Regular physical activity.  Eating a healthy diet.  Avoiding tobacco and drug use.  Limiting alcohol use.  Practicing safe sex.  Taking low doses of aspirin every day.  Taking vitamin and mineral supplements as recommended by your health care provider. What happens during an annual well check? The services and screenings done by your health care provider during your annual well check will depend on your age, overall health, lifestyle risk factors, and family history of disease. Counseling  Your health care provider may ask you questions about your:  Alcohol use.  Tobacco use.  Drug use.  Emotional well-being.   Home and relationship well-being.  Sexual activity.  Eating habits.  History of falls.  Memory and ability to understand (cognition).  Work and work Statistician. Screening  You may have the following tests or measurements:  Height, weight, and BMI.  Blood pressure.  Lipid and cholesterol levels. These may be checked every 5 years, or more frequently if you are over 80 years old.  Skin check.  Lung cancer screening. You may have this screening every year starting at age 61 if you have a 30-pack-year history of smoking and currently smoke or have quit within the past 15 years.  Fecal occult blood test (FOBT) of the stool. You may have this test every year starting at age 30.  Flexible sigmoidoscopy or colonoscopy. You may have a sigmoidoscopy every 5 years or a colonoscopy every 10 years starting at age 4.  Prostate cancer screening. Recommendations will vary depending on your family history and other risks.  Hepatitis C blood test.  Hepatitis B blood test.  Sexually transmitted disease (STD) testing.  Diabetes screening. This is done by checking your blood sugar (glucose) after you have not eaten for a while (fasting). You may have this done every 1-3 years.  Abdominal aortic aneurysm (AAA) screening. You may need this if you are a current or former smoker.  Osteoporosis. You may be screened starting at age 17 if you are at high risk. Talk with your health care provider about your test results, treatment options, and if necessary, the need for more tests. Vaccines  Your health care provider may recommend certain vaccines, such as:  Influenza vaccine. This is recommended every year.  Tetanus,  diphtheria, and acellular pertussis (Tdap, Td) vaccine. You may need a Td booster every 10 years.  Zoster vaccine. You may need this after age 31.  Pneumococcal 13-valent conjugate (PCV13) vaccine. One dose is recommended after age 62.  Pneumococcal polysaccharide (PPSV23)  vaccine. One dose is recommended after age 83. Talk to your health care provider about which screenings and vaccines you need and how often you need them. This information is not intended to replace advice given to you by your health care provider. Make sure you discuss any questions you have with your health care provider. Document Released: 04/12/2015 Document Revised: 12/04/2015 Document Reviewed: 01/15/2015 Elsevier Interactive Patient Education  2017 [place] Prevention in the Home Falls can cause injuries. They can happen to people of all ages. There are many things you can do to make your home safe and to help prevent falls. What can I do on the outside of my home?  Regularly fix the edges of walkways and driveways and fix any cracks.  Remove anything that might make you trip as you walk through a door, such as a raised step or threshold.  Trim any bushes or trees on the path to your home.  Use bright outdoor lighting.  Clear any walking paths of anything that might make someone trip, such as rocks or tools.  Regularly check to see if handrails are loose or broken. Make sure that both sides of any steps have handrails.  Any raised decks and porches should have guardrails on the edges.  Have any leaves, snow, or ice cleared regularly.  Use sand or salt on walking paths during winter.  Clean up any spills in your garage right away. This includes oil or grease spills. What can I do in the bathroom?  Use night lights.  Install grab bars by the toilet and in the tub and shower. Do not use towel bars as grab bars.  Use non-skid mats or decals in the tub or shower.  If you need to sit down in the shower, use a plastic, non-slip stool.  Keep the floor dry. Clean up any water that spills on the floor as soon as it happens.  Remove soap buildup in the tub or shower regularly.  Attach bath mats securely with double-sided non-slip rug tape.  Do not have throw rugs  and other things on the floor that can make you trip. What can I do in the bedroom?  Use night lights.  Make sure that you have a light by your bed that is easy to reach.  Do not use any sheets or blankets that are too big for your bed. They should not hang down onto the floor.  Have a firm chair that has side arms. You can use this for support while you get dressed.  Do not have throw rugs and other things on the floor that can make you trip. What can I do in the kitchen?  Clean up any spills right away.  Avoid walking on wet floors.  Keep items that you use a lot in easy-to-reach places.  If you need to reach something above you, use a strong step stool that has a grab bar.  Keep electrical cords out of the way.  Do not use floor polish or wax that makes floors slippery. If you must use wax, use non-skid floor wax.  Do not have throw rugs and other things on the floor that can make you trip. What can I do with  my stairs?  Do not leave any items on the stairs.  Make sure that there are handrails on both sides of the stairs and use them. Fix handrails that are broken or loose. Make sure that handrails are as long as the stairways.  Check any carpeting to make sure that it is firmly attached to the stairs. Fix any carpet that is loose or worn.  Avoid having throw rugs at the top or bottom of the stairs. If you do have throw rugs, attach them to the floor with carpet tape.  Make sure that you have a light switch at the top of the stairs and the bottom of the stairs. If you do not have them, ask someone to add them for you. What else can I do to help prevent falls?  Wear shoes that:  Do not have high heels.  Have rubber bottoms.  Are comfortable and fit you well.  Are closed at the toe. Do not wear sandals.  If you use a stepladder:  Make sure that it is fully opened. Do not climb a closed stepladder.  Make sure that both sides of the stepladder are locked into  place.  Ask someone to hold it for you, if possible.  Clearly mark and make sure that you can see:  Any grab bars or handrails.  First and last steps.  Where the edge of each step is.  Use tools that help you move around (mobility aids) if they are needed. These include:  Canes.  Walkers.  Scooters.  Crutches.  Turn on the lights when you go into a dark area. Replace any light bulbs as soon as they burn out.  Set up your furniture so you have a clear path. Avoid moving your furniture around.  If any of your floors are uneven, fix them.  If there are any pets around you, be aware of where they are.  Review your medicines with your doctor. Some medicines can make you feel dizzy. This can increase your chance of falling. Ask your doctor what other things that you can do to help prevent falls. This information is not intended to replace advice given to you by your health care provider. Make sure you discuss any questions you have with your health care provider. Document Released: 01/10/2009 Document Revised: 08/22/2015 Document Reviewed: 04/20/2014 Elsevier Interactive Patient Education  2017 [employer].

## 2019-02-28 LAB — LIPID PANEL
Cholesterol: 142 mg/dL (ref <200)
HDL: 48 mg/dL (ref >=40)
LDL Cholesterol (Calc): 78 mg/dL (calc)
Non-HDL Cholesterol (Calc): 94 mg/dL (calc) (ref <130)
Total CHOL/HDL Ratio: 3 (calc) (ref <5.0)
Triglycerides: 79 mg/dL (ref <150)

## 2019-02-28 LAB — CBC WITH DIFFERENTIAL/PLATELET
Absolute Monocytes: 802 cells/uL (ref 200–950)
Basophils Absolute: 81 cells/uL (ref 0–200)
Basophils Relative: 1 %
Eosinophils Absolute: 122 cells/uL (ref 15–500)
Eosinophils Relative: 1.5 %
HCT: 39.6 % (ref 38.5–50.0)
Hemoglobin: 13.2 g/dL (ref 13.2–17.1)
Lymphs Abs: 1458 cells/uL (ref 850–3900)
MCH: 33.1 pg — ABNORMAL HIGH (ref 27.0–33.0)
MCHC: 33.3 g/dL (ref 32.0–36.0)
MCV: 99.2 fL (ref 80.0–100.0)
MPV: 9.9 fL (ref 7.5–12.5)
Monocytes Relative: 9.9 %
Neutro Abs: 5638 cells/uL (ref 1500–7800)
Neutrophils Relative %: 69.6 %
Platelets: 212 10*3/uL (ref 140–400)
RBC: 3.99 10*6/uL — ABNORMAL LOW (ref 4.20–5.80)
RDW: 13.1 % (ref 11.0–15.0)
Total Lymphocyte: 18 %
WBC: 8.1 10*3/uL (ref 3.8–10.8)

## 2019-02-28 LAB — COMPLETE METABOLIC PANEL WITH GFR
AG Ratio: 1.7 (calc) (ref 1.0–2.5)
ALT: 16 U/L (ref 9–46)
AST: 14 U/L (ref 10–35)
Albumin: 4 g/dL (ref 3.6–5.1)
Alkaline phosphatase (APISO): 44 U/L (ref 35–144)
BUN: 11 mg/dL (ref 7–25)
CO2: 30 mmol/L (ref 20–32)
Calcium: 9.4 mg/dL (ref 8.6–10.3)
Chloride: 103 mmol/L (ref 98–110)
Creat: 0.97 mg/dL (ref 0.70–1.11)
GFR, Est African American: 85 mL/min/{1.73_m2} (ref >=60)
GFR, Est Non African American: 73 mL/min/{1.73_m2} (ref >=60)
Globulin: 2.4 g/dL (calc) (ref 1.9–3.7)
Glucose, Bld: 91 mg/dL (ref 65–99)
Potassium: 4.3 mmol/L (ref 3.5–5.3)
Sodium: 139 mmol/L (ref 135–146)
Total Bilirubin: 0.5 mg/dL (ref 0.2–1.2)
Total Protein: 6.4 g/dL (ref 6.1–8.1)

## 2019-02-28 LAB — VITAMIN B12: Vitamin B-12: 696 pg/mL (ref 200–1100)

## 2019-03-06 ENCOUNTER — Other Ambulatory Visit: Payer: Self-pay | Admitting: Interventional Cardiology

## 2019-03-06 MED ORDER — FUROSEMIDE 40 MG PO TABS: ORAL_TABLET | ORAL | 3 refills | Status: DC

## 2019-03-07 DIAGNOSIS — M545 Low back pain: Secondary | ICD-10-CM | POA: Diagnosis not present

## 2019-03-07 DIAGNOSIS — M7918 Myalgia, other site: Secondary | ICD-10-CM | POA: Diagnosis not present

## 2019-03-07 DIAGNOSIS — G8929 Other chronic pain: Secondary | ICD-10-CM | POA: Diagnosis not present

## 2019-03-16 ENCOUNTER — Ambulatory Visit (INDEPENDENT_AMBULATORY_CARE_PROVIDER_SITE_OTHER): Payer: Medicare Other | Admitting: Internal Medicine

## 2019-03-16 ENCOUNTER — Other Ambulatory Visit: Payer: Self-pay

## 2019-03-16 ENCOUNTER — Encounter: Payer: Self-pay | Admitting: Internal Medicine

## 2019-03-16 VITALS — BP 110/60 | Temp 98.0°F | Ht 71.0 in | Wt 187.0 lb

## 2019-03-16 DIAGNOSIS — R238 Other skin changes: Secondary | ICD-10-CM

## 2019-03-16 DIAGNOSIS — I255 Ischemic cardiomyopathy: Secondary | ICD-10-CM

## 2019-03-16 DIAGNOSIS — S61412S Laceration without foreign body of left hand, sequela: Secondary | ICD-10-CM

## 2019-03-16 NOTE — Patient Instructions (Addendum)
Please call [facility] at [phone] for an appointment with Dr. Elvera Lennox to evaluate the keratotic papule on your left hand.

## 2019-03-16 NOTE — Progress Notes (Signed)
Location:  [facility] clinic Provider: Samarrah Tranchina L. Mariea Clonts, D.O., C.M.D.  Goals of Care:  Advanced Directives 02/27/2019  Does Patient Have a Medical Advance Directive? No  Does patient want to make changes to medical advance directive? -  Would patient like information on creating a medical advance directive? Yes (MAU/Ambulatory/Procedural Areas - Information given)   Chief Complaint  Patient presents with  . Acute Visit    growth on left hand from a cut x weeks    HPI: Patient is a 78 y.o. male seen today for an acute visit for lesion on left hand.    Steven Guzman has a h/o aortic aneurysm, CAD with 2MIs and VT s/p AICD, COPD, pancreatitis, CKD, BPH, htn, hyperlipidemia seen for bruise on left hand.  NP [name] is his PCP.    He notes that over a month ago, he had a skin tear of his left hand.  He has chronic purpura of both hands and forearms related to his xarelto therapy.  He noted that on the left hand, a growth began over his left 4th hand tendon.  It's painful on the aspect closer to his 5th digit.  He's previously broken a bone in the 5th digit and there's some deformity.  He also has a 4th trigger finger.  Of note, he has had three basal cell carcinomas removed from his left ear and right temple area a few months ago.   Past Medical History:  Diagnosis Date  . AICD (automatic cardioverter/defibrillator) present   . Aneurysm ([place])    a. Aneurysmal infrarenal aorta up to 33 mm on CT 10/2014, recommended f/u due 10/2017  . Anginal pain ([place])   . Anxiety   . Basal cell carcinoma of nose    S/P MOHS  . Biliary acute pancreatitis   . CAD (coronary artery disease)    a. s/p MI in 1994 with PCI to LAD at that time b. cath 10/2012 demonstrated EF 30%, inferior akinesis with mild hypokinesis of all walls, patent LAD and RCA stents; ostial PDA with 80-90% obstruction with medical therapy recommended   . Chronic systolic CHF (congestive heart failure) (HCC)    EF 30 to 35 % as of 09/2014.   [name] CKD  (chronic kidney disease), stage III   . Complication of anesthesia 10/2014   "had to have defibrillator w/ERCP"  . COPD (chronic obstructive pulmonary disease) ([place])    a. followed by pulmonary, COPD GOLD stage II  . Depression   . Diverticulosis of colon 07/2014   noted on CT  . GERD (gastroesophageal reflux disease)   . Hiatal hernia   . Hyperglycemia 10/2012.  [name] Hyperlipidemia   . Hypertension   . Myocardial infarction [facility]) 1994; 2011  . Pneumonia 1946; 2015  . Prostate enlargement 07/2014   observed on CT  . Tobacco abuse   . Ventricular tachycardia ([place])    a. 08/2009 s/p BSX E110 Teligen 100 AICD, ser#: [id number];  b. 08/2008 VT req ATP - detection reprogrammed from 160 to 150. c. EPS and VT ablation by Dr. Lovena Le 12/21/2014    Past Surgical History:  Procedure Laterality Date  . BIOPSY  12/21/2017   Procedure: BIOPSY;  Surgeon: [name], MD;  Location: [facility];  Service: Endoscopy;;  . CATARACT EXTRACTION W/ INTRAOCULAR LENS  IMPLANT, BILATERAL Bilateral ~ 2011  . COLONOSCOPY    . COLONOSCOPY WITH PROPOFOL N/A 12/21/2017   Procedure: COLONOSCOPY WITH PROPOFOL;  Surgeon: [name], MD;  Location: [facility];  Service: Endoscopy;  Laterality: N/A;  . ELECTROPHYSIOLOGIC STUDY N/A 12/21/2014   Procedure: V Tach Ablation;  Surgeon: [name], MD;  Location: [facility];  Service: Cardiovascular;  Laterality: N/A;  . ERCP N/A 11/16/2014   Procedure: ENDOSCOPIC RETROGRADE CHOLANGIOPANCREATOGRAPHY (ERCP);  Surgeon: [name], MD;  Location: [facility];  Service: Endoscopy;  Laterality: N/A;  . ESOPHAGOGASTRODUODENOSCOPY (EGD) WITH PROPOFOL N/A 12/21/2017   Procedure: ESOPHAGOGASTRODUODENOSCOPY (EGD) WITH PROPOFOL;  Surgeon: [name], MD;  Location: [facility];  Service: Endoscopy;  Laterality: N/A;  . EYE SURGERY    . FOOT SURGERY Left 2005   "fixed bone that stuck out in my ankle area"  . HEMORRHOID BANDING    . IMPLANTABLE CARDIOVERTER  DEFIBRILLATOR IMPLANT  09/06/09   BSX dual chamber ICD implanted in [place] for cardiac arrest 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  . INGUINAL HERNIA REPAIR Right ~ 1995  . LEFT HEART CATHETERIZATION WITH CORONARY ANGIOGRAM N/A 11/25/2012   demonstrated EF 30%, inferior akinesis with mild hypokinesis of all walls, patent LAD and RCA stents; ostial PDA with 80-90% obstruction with medical therapy recommended  . MOHS SURGERY  2008   nose, skin graft  . POLYPECTOMY  12/21/2017   Procedure: POLYPECTOMY;  Surgeon: [name], MD;  Location: [facility];  Service: Endoscopy;;  . RETINAL DETACHMENT SURGERY Right 2013  . TENOLYSIS Right 12/21/2013   Procedure: TENOLYSIS FLEXOR CARPI RADIALIS ,DEBRIDEMENT RIGHT JOINT WRIST,DEBRIDEMENT SCAPHOTRAPEZIAL TRAPEZOID, REPAIR OF EXTENSOR HOOD;  Surgeon: Daryll Brod, MD;  Location: [facility];  Service: Orthopedics;  Laterality: Right;  . V-TACH ABLATION  12/21/2014  . VIDEO BRONCHOSCOPY Bilateral 01/09/2016   Procedure: VIDEO BRONCHOSCOPY WITHOUT FLUORO;  Surgeon: Juanito Doom, MD;  Location: [facility];  Service: Cardiopulmonary;  Laterality: Bilateral;    Allergies  Allergen Reactions  . Sulfa Antibiotics Hives    Outpatient Encounter Medications as of 03/16/2019  Medication Sig  . albuterol (PROVENTIL) (2.5 MG/3ML) 0.083% nebulizer solution Take 3 mLs (2.5 mg total) by nebulization every 6 (six) hours as needed for wheezing or shortness of breath.  [name] amiodarone (PACERONE) 200 MG tablet TAKE 1 TABLET(200 MG) BY MOUTH DAILY  . Artificial Tear Solution (SOOTHE XP OP) Place 2 drops into both eyes daily as needed (dry eyes).  [name] aspirin EC 81 MG tablet Take 1 tablet (81 mg total) by mouth every Monday, Wednesday, and Friday.  [name] atorvastatin (LIPITOR) 80 MG tablet TAKE 1 TABLET(80 MG) BY MOUTH DAILY  . benazepril (LOTENSIN) 10 MG tablet TAKE 1 TABLET BY MOUTH ONCE DAILY  . benzonatate (TESSALON) 200 MG capsule Take 1 capsule (200 mg total) by mouth  3 (three) times daily as needed for cough.  . budesonide-formoterol (SYMBICORT) 160-4.5 MCG/ACT inhaler Inhale 2 puffs into the lungs 2 (two) times daily.  [name] buPROPion (WELLBUTRIN) 75 MG tablet TAKE 1 TABLET(75 MG) BY MOUTH DAILY  . busPIRone (BUSPAR) 15 MG tablet TAKE 1 TABLET(15 MG) BY MOUTH THREE TIMES DAILY  . carvedilol (COREG) 3.125 MG tablet TAKE 1 TABLET(3.125 MG) BY MOUTH TWICE DAILY  . Choline Fenofibrate (FENOFIBRIC ACID) 135 MG CPDR TAKE 1 CAPSULE BY MOUTH DAILY  . Cyanocobalamin (RA VITAMIN B-12) 1000 MCG/ML LIQD Take 1,000 mcg by mouth daily.  [name] docusate sodium (COLACE) 100 MG capsule Take 100 mg by mouth 2 (two) times daily.  [name] escitalopram (LEXAPRO) 20 MG tablet TAKE 1 TABLET(20 MG) BY MOUTH DAILY  . fluticasone (FLONASE) 50 MCG/ACT nasal spray INSTILL 2 SPRAYS  INTO EACH NOSTRIL QD  . furosemide (LASIX) 40 MG tablet TAKE 1/2 TABLET(20 MG) BY MOUTH DAILY  . Ipratropium-Albuterol (COMBIVENT RESPIMAT) 20-100 MCG/ACT AERS respimat Inhale 1 puff into the lungs every 6 (six) hours. Shortness of breath or wheezing  . IRON PO Take by mouth daily.   [name] MAGNESIUM-OXIDE 400 (241.3 Mg) MG tablet TAKE 1 TABSULE BY MOUTH DAILY  . mexiletine (MEXITIL) 200 MG capsule TAKE 1 CAPSULE(200 MG) BY MOUTH TWICE DAILY  . Multiple Vitamins-Minerals (CENTRUM ADULTS PO) Take by mouth daily.  . nitroGLYCERIN (NITROSTAT) 0.4 MG SL tablet DISSOLVE 1 TABLET UNDER THE TONGUE EVERY 5 MINUTES FOR 3 DOSES AS NEEDED FOR CHEST PAIN  . omeprazole (PRILOSEC) 20 MG capsule Take 20 mg by mouth daily.   . potassium chloride SA (K-DUR) 20 MEQ tablet TAKE 1 TABLET(20 MEQ) BY MOUTH DAILY  . Respiratory Therapy Supplies (FLUTTER) DEVI Use as directed  . senna (SENOKOT) 8.6 MG tablet Take 1-2 tablets by mouth daily.  . tamsulosin (FLOMAX) 0.4 MG CAPS capsule TAKE 1 CAPSULE(0.4 MG) BY MOUTH DAILY AFTER SUPPER  . Tiotropium Bromide Monohydrate (SPIRIVA RESPIMAT) 2.5 MCG/ACT AERS Inhale 2 puffs into the lungs daily.  . vitamin  B-12 (CYANOCOBALAMIN) 1000 MCG tablet Take 1 tablet (1,000 mcg total) by mouth daily.  Alveda Reasons 20 MG TABS tablet TAKE 1 TABLET BY MOUTH EVERY DAY WITH SUPPER   No facility-administered encounter medications on file as of 03/16/2019.    Review of Systems:  Review of Systems  Constitutional: Negative for chills, fever and malaise/fatigue.  HENT: Negative for congestion and sore throat.   Eyes:       Glasses  Respiratory: Negative for cough and shortness of breath.   Cardiovascular: Negative for chest pain, palpitations and leg swelling.       CAD s/p 2 stents, ICD  Skin: Negative for itching.       Raised place on left dorsum of hand; tender    Health Maintenance  Topic Date Due  . TETANUS/TDAP  05/11/2024  . INFLUENZA VACCINE  Completed  . PNA vac Low Risk Adult  Completed    Physical Exam: Vitals:   03/16/19 0733  BP: 110/60  Temp: 98 F (36.7 C)  TempSrc: Oral  Weight: 187 lb (84.8 kg)  Height: 5\' 11"  (1.803 m)   Body mass index is 26.08 kg/m. Physical Exam Vitals reviewed.  Constitutional:      Appearance: Normal appearance.  Pulmonary:     Effort: Pulmonary effort is normal.  Musculoskeletal:        General: Normal range of motion.     Comments: Left 4th trigger finger  Skin:    General: Skin is warm and dry.     Comments: Ecchymoses of both dorsal surfaces of hands and forearms; over 4th finger tendon on left is raised keratotic papule that is tender on lateral aspect   Neurological:     Mental Status: He is alert.     Labs reviewed: Basic Metabolic Panel: Recent Labs    04/25/18 1602 09/28/18 1030 12/15/18 0101 02/27/19 1056  NA  --  138 138 139  K  --  3.6 4.0 4.3  CL  --  102 105 103  CO2  --  29 26 30   GLUCOSE  --  69* 99 91  BUN  --  11 12 11   CREATININE  --  0.92 1.05 0.97  CALCIUM  --  9.6 9.3 9.4  MG  --  1.7  --   --  TSH 1.450 1.97  --   --    Liver Function Tests: Recent Labs    09/28/18 1030 12/15/18 0101 02/27/19 1056    AST 13 18 14   ALT 15 25 16   ALKPHOS 43 51  --   BILITOT 0.6 0.6 0.5  PROT 6.7 6.6 6.4  ALBUMIN 4.2 3.9  --    No results for input(s): LIPASE, AMYLASE in the last 8760 hours. No results for input(s): AMMONIA in the last 8760 hours. CBC: Recent Labs    09/28/18 1030 12/15/18 0101 02/27/19 1056  WBC 7.2 6.8 8.1  NEUTROABS  --  4.2 5,638  HGB 13.7 13.3 13.2  HCT 41.8 40.1 39.6  MCV 100.4* 102.8* 99.2  PLT 209.0 191 212   Lipid Panel: Recent Labs    02/27/19 1056  CHOL 142  HDL 48  LDLCALC 78  TRIG 79  CHOLHDL 3.0   Lab Results  Component Value Date   HGBA1C 5.6 12/29/2016    Assessment/Plan 1. Papule of skin -I'm concerned this may be a basal cell carcinoma so I've advised he go back to Dr. Elvera Lennox  2. Skin tear of left hand without complication, sequela -has healed and papule developed afterward and has been growing  Labs/tests ordered:  No new Next appt:  08/30/2019--keep regular visit with Janett Billow  Izamar Linden L. Charna Neeb, D.O. [facility] [address], [place] [address] Cell Phone (Mon-Fri 8am-5pm):  [phone] On Call:  [phone] & follow prompts after 5pm & weekends Office Phone:  [phone] Office Fax:  [phone]

## 2019-03-17 ENCOUNTER — Other Ambulatory Visit: Payer: Self-pay | Admitting: Internal Medicine

## 2019-03-17 DIAGNOSIS — I25810 Atherosclerosis of coronary artery bypass graft(s) without angina pectoris: Secondary | ICD-10-CM

## 2019-03-17 DIAGNOSIS — I5022 Chronic systolic (congestive) heart failure: Secondary | ICD-10-CM

## 2019-03-27 ENCOUNTER — Telehealth: Payer: Self-pay | Admitting: Pulmonary Disease

## 2019-03-27 DIAGNOSIS — J432 Centrilobular emphysema: Secondary | ICD-10-CM

## 2019-03-27 MED ORDER — COMBIVENT RESPIMAT 20-100 MCG/ACT IN AERS: 1.0000 | INHALATION_SPRAY | Freq: Four times a day (QID) | RESPIRATORY_TRACT | 5 refills | Status: DC

## 2019-03-27 MED ORDER — BUDESONIDE-FORMOTEROL FUMARATE 160-4.5 MCG/ACT IN AERO: 2.0000 | INHALATION_SPRAY | Freq: Two times a day (BID) | RESPIRATORY_TRACT | 5 refills | Status: DC

## 2019-03-27 NOTE — Telephone Encounter (Signed)
Spoke with pt, states he needs a prior auth for both his combivent and symbicort. Called pharmacy to verify, a PA is not needed for either med but a refill is needed for both.  This has been called in to pharmacy.  Nothing further needed at this time- will close encounter.

## 2019-04-03 ENCOUNTER — Other Ambulatory Visit: Payer: Self-pay

## 2019-04-03 ENCOUNTER — Encounter (HOSPITAL_BASED_OUTPATIENT_CLINIC_OR_DEPARTMENT_OTHER): Payer: Self-pay

## 2019-04-03 ENCOUNTER — Emergency Department (HOSPITAL_BASED_OUTPATIENT_CLINIC_OR_DEPARTMENT_OTHER)
Admission: EM | Admit: 2019-04-03 | Discharge: 2019-04-03 | Discharge status: Against Medical Advice | Payer: Medicare Other | Attending: Emergency Medicine | Admitting: Emergency Medicine

## 2019-04-03 ENCOUNTER — Encounter (HOSPITAL_BASED_OUTPATIENT_CLINIC_OR_DEPARTMENT_OTHER): Payer: Self-pay | Admitting: *Deleted

## 2019-04-03 ENCOUNTER — Emergency Department (HOSPITAL_BASED_OUTPATIENT_CLINIC_OR_DEPARTMENT_OTHER): Payer: Medicare Other

## 2019-04-03 ENCOUNTER — Other Ambulatory Visit: Payer: Self-pay | Admitting: Nurse Practitioner

## 2019-04-03 DIAGNOSIS — Z79899 Other long term (current) drug therapy: Secondary | ICD-10-CM | POA: Insufficient documentation

## 2019-04-03 DIAGNOSIS — Z7982 Long term (current) use of aspirin: Secondary | ICD-10-CM | POA: Insufficient documentation

## 2019-04-03 DIAGNOSIS — F17210 Nicotine dependence, cigarettes, uncomplicated: Secondary | ICD-10-CM | POA: Insufficient documentation

## 2019-04-03 DIAGNOSIS — I252 Old myocardial infarction: Secondary | ICD-10-CM | POA: Insufficient documentation

## 2019-04-03 DIAGNOSIS — L03114 Cellulitis of left upper limb: Secondary | ICD-10-CM | POA: Insufficient documentation

## 2019-04-03 DIAGNOSIS — I2510 Atherosclerotic heart disease of native coronary artery without angina pectoris: Secondary | ICD-10-CM | POA: Insufficient documentation

## 2019-04-03 DIAGNOSIS — I5022 Chronic systolic (congestive) heart failure: Secondary | ICD-10-CM | POA: Insufficient documentation

## 2019-04-03 DIAGNOSIS — Z882 Allergy status to sulfonamides status: Secondary | ICD-10-CM | POA: Diagnosis not present

## 2019-04-03 DIAGNOSIS — Z95810 Presence of automatic (implantable) cardiac defibrillator: Secondary | ICD-10-CM | POA: Diagnosis not present

## 2019-04-03 DIAGNOSIS — J449 Chronic obstructive pulmonary disease, unspecified: Secondary | ICD-10-CM | POA: Insufficient documentation

## 2019-04-03 DIAGNOSIS — I509 Heart failure, unspecified: Secondary | ICD-10-CM | POA: Insufficient documentation

## 2019-04-03 DIAGNOSIS — N183 Chronic kidney disease, stage 3 unspecified: Secondary | ICD-10-CM | POA: Insufficient documentation

## 2019-04-03 DIAGNOSIS — M7989 Other specified soft tissue disorders: Secondary | ICD-10-CM | POA: Diagnosis not present

## 2019-04-03 DIAGNOSIS — E785 Hyperlipidemia, unspecified: Secondary | ICD-10-CM | POA: Diagnosis not present

## 2019-04-03 DIAGNOSIS — I130 Hypertensive heart and chronic kidney disease with heart failure and stage 1 through stage 4 chronic kidney disease, or unspecified chronic kidney disease: Secondary | ICD-10-CM | POA: Insufficient documentation

## 2019-04-03 DIAGNOSIS — R2232 Localized swelling, mass and lump, left upper limb: Secondary | ICD-10-CM | POA: Diagnosis not present

## 2019-04-03 NOTE — ED Triage Notes (Signed)
Pt c/o swelling to left hand-started 1/2 am-denies injury-NAD-steady gait

## 2019-04-03 NOTE — ED Triage Notes (Signed)
Pt c/o hand swelling

## 2019-04-04 ENCOUNTER — Emergency Department (HOSPITAL_BASED_OUTPATIENT_CLINIC_OR_DEPARTMENT_OTHER)
Admission: EM | Admit: 2019-04-04 | Discharge: 2019-04-04 | Disposition: A | Discharge status: Home / Self Care | Payer: Medicare Other | Source: Home / Self Care | Attending: Emergency Medicine | Admitting: Emergency Medicine

## 2019-04-04 DIAGNOSIS — R2232 Localized swelling, mass and lump, left upper limb: Secondary | ICD-10-CM | POA: Diagnosis not present

## 2019-04-04 DIAGNOSIS — L03114 Cellulitis of left upper limb: Secondary | ICD-10-CM

## 2019-04-04 MED ORDER — PROBIOTIC PO CAPS: 1.0000 | ORAL_CAPSULE | Freq: Every day | ORAL | 1 refills | Status: DC

## 2019-04-04 MED ORDER — CEPHALEXIN 250 MG PO CAPS
500.0000 mg | ORAL_CAPSULE | Freq: Once | ORAL | Status: AC
  Administered 2019-04-04: 03:00:00 500 mg via ORAL
  Filled 2019-04-04: qty 2

## 2019-04-04 MED ORDER — CEPHALEXIN 500 MG PO CAPS: 500.0000 mg | ORAL_CAPSULE | Freq: Four times a day (QID) | ORAL | 0 refills | Status: DC

## 2019-04-04 NOTE — ED Provider Notes (Addendum)
TIME SEEN: 2:40 AM  CHIEF COMPLAINT: Left hand swelling  HPI: Patient is an 79 year old right-hand-dominant male who presents to the emergency department with left hand swelling for the past day.  No injury that he can recall.  He feels like it has been more discolored than normal.  He states most of the pain is around the MCP of the second and third digit.  He is able to make a fist but feels tight across the knuckles.  Has a lesion just proximal to the fourth and fifth digits that has been there since a previous injury in August of last year.  States he has an appointment to see a dermatologist for this on January 19.  Denies any fever.  Has chronic shortness of breath which is unchanged from baseline other than missing his home medications tonight.  No chest pain or chest discomfort.  Is on Xarelto for previous history of blood clot.  No missed doses.  States he was concerned that he could have a DVT today.  ROS: See HPI Constitutional: no fever  Eyes: no drainage  ENT: no runny nose   Cardiovascular:  no chest pain  Resp: no SOB  GI: no vomiting GU: no dysuria Integumentary: no rash  Allergy: no hives  Musculoskeletal: no leg swelling  Neurological: no slurred speech ROS otherwise negative  PAST MEDICAL HISTORY/PAST SURGICAL HISTORY:  Past Medical History:  Diagnosis Date  . AICD (automatic cardioverter/defibrillator) present   . Aneurysm ([place])    a. Aneurysmal infrarenal aorta up to 33 mm on CT 10/2014, recommended f/u due 10/2017  . Anginal pain ([place])   . Anxiety   . Basal cell carcinoma of nose    S/P MOHS  . Biliary acute pancreatitis   . CAD (coronary artery disease)    a. s/p MI in 1994 with PCI to LAD at that time b. cath 10/2012 demonstrated EF 30%, inferior akinesis with mild hypokinesis of all walls, patent LAD and RCA stents; ostial PDA with 80-90% obstruction with medical therapy recommended   . Chronic systolic CHF (congestive heart failure) (HCC)    EF 30 to 35 % as  of 09/2014.   [name] CKD (chronic kidney disease), stage III   . Complication of anesthesia 10/2014   "had to have defibrillator w/ERCP"  . COPD (chronic obstructive pulmonary disease) ([place])    a. followed by pulmonary, COPD GOLD stage II  . Depression   . Diverticulosis of colon 07/2014   noted on CT  . GERD (gastroesophageal reflux disease)   . Hiatal hernia   . Hyperglycemia 10/2012.  [name] Hyperlipidemia   . Hypertension   . Myocardial infarction [facility]) 1994; 2011  . Pneumonia 1946; 2015  . Prostate enlargement 07/2014   observed on CT  . Tobacco abuse   . Ventricular tachycardia ([place])    a. 08/2009 s/p BSX E110 Teligen 100 AICD, ser#: [id number];  b. 08/2008 VT req ATP - detection reprogrammed from 160 to 150. c. EPS and VT ablation by Dr. Lovena Le 12/21/2014    MEDICATIONS:  Prior to Admission medications   Medication Sig Start Date End Date Taking? Authorizing Provider  albuterol (PROVENTIL) (2.5 MG/3ML) 0.083% nebulizer solution Take 3 mLs (2.5 mg total) by nebulization every 6 (six) hours as needed for wheezing or shortness of breath. 06/03/18   Juanito Doom, MD  amiodarone (PACERONE) 200 MG tablet TAKE 1 TABLET(200 MG) BY MOUTH DAILY 06/16/18   Belva Crome, MD  Artificial Tear Solution (SOOTHE XP  OP) Place 2 drops into both eyes daily as needed (dry eyes).    [provider]  aspirin EC 81 MG tablet Take 1 tablet (81 mg total) by mouth every Monday, Wednesday, and Friday. 04/25/18   Belva Crome, MD  atorvastatin (LIPITOR) 80 MG tablet TAKE 1 TABLET(80 MG) BY MOUTH DAILY 10/21/18   Burtis Junes, NP  benazepril (LOTENSIN) 10 MG tablet TAKE 1 TABLET BY MOUTH ONCE DAILY 12/30/18   Belva Crome, MD  benzonatate (TESSALON) 200 MG capsule Take 1 capsule (200 mg total) by mouth 3 (three) times daily as needed for cough. 12/15/18   Lauraine Rinne, NP  budesonide-formoterol (SYMBICORT) 160-4.5 MCG/ACT inhaler Inhale 2 puffs into the lungs 2 (two) times daily. 03/27/19   Laurin Coder, MD  buPROPion (WELLBUTRIN) 75 MG tablet TAKE 1 TABLET(75 MG) BY MOUTH DAILY 12/30/18   [name], NP  busPIRone (BUSPAR) 15 MG tablet TAKE 1 TABLET(15 MG) BY MOUTH THREE TIMES DAILY 03/20/19   [name], NP  carvedilol (COREG) 3.125 MG tablet TAKE 1 TABLET(3.125 MG) BY MOUTH TWICE DAILY 03/20/19   [name], NP  Choline Fenofibrate (FENOFIBRIC ACID) 135 MG CPDR TAKE 1 CAPSULE BY MOUTH DAILY 10/26/18   Belva Crome, MD  Cyanocobalamin (RA VITAMIN B-12) 1000 MCG/ML LIQD Take 1,000 mcg by mouth daily.    [provider]  docusate sodium (COLACE) 100 MG capsule Take 100 mg by mouth 2 (two) times daily.    [provider]  escitalopram (LEXAPRO) 20 MG tablet TAKE 1 TABLET(20 MG) BY MOUTH DAILY 02/13/19   Hoyt Koch, MD  fluticasone [facility]) 50 MCG/ACT nasal spray INSTILL 2 SPRAYS INTO EACH NOSTRIL QD 04/14/18   [provider]  furosemide (LASIX) 40 MG tablet TAKE 1/2 TABLET(20 MG) BY MOUTH DAILY 03/06/19   Belva Crome, MD  Ipratropium-Albuterol (COMBIVENT RESPIMAT) 20-100 MCG/ACT AERS respimat Inhale 1 puff into the lungs every 6 (six) hours. Shortness of breath or wheezing 03/27/19   Sherrilyn Rist A, MD  IRON PO Take by mouth daily.     [provider]  MAGNESIUM-OXIDE 400 (241.3 Mg) MG tablet TAKE 1 TABSULE BY MOUTH DAILY 11/24/18   Hoyt Koch, MD  mexiletine (MEXITIL) 200 MG capsule TAKE 1 CAPSULE(200 MG) BY MOUTH TWICE DAILY 11/09/18   [name], MD  Multiple Vitamins-Minerals (CENTRUM ADULTS PO) Take by mouth daily.    [provider]  nitroGLYCERIN (NITROSTAT) 0.4 MG SL tablet DISSOLVE 1 TABLET UNDER THE TONGUE EVERY 5 MINUTES FOR 3 DOSES AS NEEDED FOR CHEST PAIN 09/26/18   [name], MD  omeprazole (PRILOSEC) 20 MG capsule Take 20 mg by mouth daily.     [provider]  potassium chloride SA (K-DUR) 20 MEQ tablet TAKE 1 TABLET(20 MEQ) BY MOUTH DAILY 10/24/18   Hoyt Koch, MD  Respiratory Therapy Supplies (FLUTTER) DEVI Use as directed 03/16/17   Juanito Doom, MD  senna (SENOKOT) 8.6 MG tablet Take 1-2 tablets by mouth daily.    [provider]  tamsulosin (FLOMAX) 0.4 MG CAPS capsule TAKE 1 CAPSULE(0.4 MG) BY MOUTH DAILY AFTER SUPPER 02/13/19   Hoyt Koch, MD  Tiotropium Bromide Monohydrate (SPIRIVA RESPIMAT) 2.5 MCG/ACT AERS Inhale 2 puffs into the lungs daily. 12/19/18   Laurin Coder, MD  vitamin B-12 (CYANOCOBALAMIN) 1000 MCG tablet Take 1 tablet (1,000 mcg total) by mouth daily. 02/27/19   [name], NP  XARELTO 20 MG TABS tablet TAKE 1 TABLET BY MOUTH EVERY DAY WITH SUPPER 11/03/18   [name], MD    ALLERGIES:  Allergies  Allergen Reactions  . Sulfa Antibiotics Hives    SOCIAL HISTORY:  Social History   Tobacco Use  . Smoking status: Current Every Day Smoker    Packs/day: 1.00    Years: 55.00    Pack years: 55.00    Types: Cigarettes  . Smokeless tobacco: Never Used  . Tobacco comment: always ready to quit but does not work out  Substance Use Topics  . Alcohol use: Yes    Alcohol/week: 0.0 standard drinks    Comment: occ    FAMILY HISTORY: Family History  Problem Relation Age of Onset  . Heart attack Brother   . CAD Father   . Hypertension Father   . CAD Mother   . Hypertension Mother   . Hypertension Brother   . Stroke Neg Hx     EXAM: BP (!) 145/92 (BP Location: Right Arm)   Pulse 77   Temp 97.7 F (36.5 C) (Oral)   Resp 19   Ht 6' (1.829 m)   Wt 86 kg   SpO2 93%   BMI 25.71 kg/m  CONSTITUTIONAL: Alert and oriented and responds appropriately to questions.  Elderly, extremely pleasant, afebrile HEAD: Normocephalic EYES: Conjunctivae clear, pupils appear equal, EOM appear intact ENT: normal nose; moist mucous membranes NECK: Supple, normal ROM CARD: RRR; S1 and S2 appreciated; no murmurs, no clicks, no rubs, no gallops RESP: Scattered expiratory wheezes.  No rhonchi or  rales.  No hypoxia or respiratory distress.  Able to speak in full sentences. ABD/GI: Normal bowel sounds; non-distended; soft, non-tender, no rebound, no guarding, no peritoneal signs, no hepatosplenomegaly BACK:  The back appears normal EXT: Patient has chronic skin changes noted to bilateral dorsal hands.  He does have some increasing redness on the left side over the MCPs compared to the right.  There is a small amount of warmth over the dorsal hand.  There is no fluctuance.  His palms appear normal without any tenderness, fluctuance, redness or warmth over the flexor tendons.  He is able to fully extend and flex all of his digits and wrist without difficulty.  He has a 2+ left radial pulse.  He has approximately 2 x 2 centimeter papular lesion to the proximal ulnar portion of the dorsal hand that he reports is chronic.  It is not fluctuant, significantly tender, red or hot.  There is no swelling up the left arm.  Left forearm and upper arm appear normal without swelling, tenderness, redness, warmth.  No deformity noted.  Compartments soft.  Reports normal sensation on the left hand and arm.  No joint effusions noted.  Please see picture below. SKIN: Normal color for age and race; warm; no rash on exposed skin NEURO: Moves all extremities equally PSYCH: The patient's mood and manner are appropriate.   MEDICAL DECISION MAKING: Patient here with left hand swelling.  Discussed with patient that I have low suspicion for DVT given he is compliant with Xarelto but also he has no forearm or upper left arm swelling.  Doubt arterial obstruction given he has normal sensation, normal capillary refill and normal pulse.  No sign of gout or septic arthritis.  X-ray obtained yesterday shows no acute fracture.  He does have a chronic fracture deformity of the fifth metacarpal head and neck but is not tender over this area.  He has an  appointment to see a dermatologist on January 19 for the papule noted to the left  dorsal hand.  I do not think this is causing his symptoms today as it has been present for months.  No sign of compartment syndrome.  He could have early cellulitis based on this redness and warmth that is present but there is no abscess no open lesion.  No sign of flexor tenosynovitis, herpetic whitlow, felon.  Will place him on Keflex and probiotics.  Have recommended rest, elevation and ice and close follow-up with his PCP.  We have discussed at length return precautions.  He verbalized understanding.   At this time, I do not feel there is any life-threatening condition present. I have reviewed, interpreted and discussed all results (EKG, imaging, lab, urine as appropriate) and exam findings with patient/family. I have reviewed nursing notes and appropriate previous records.  I feel the patient is safe to be discharged home without further emergent workup and can continue workup as an outpatient as needed. Discussed usual and customary return precautions. Patient/family verbalize understanding and are comfortable with this plan.  Outpatient follow-up has been provided as needed. All questions have been answered.        ZURICH ARRON was evaluated in Emergency Department on 04/04/2019 for the symptoms described in the history of present illness. He was evaluated in the context of the global COVID-19 pandemic, which necessitated consideration that the patient might be at risk for infection with the SARS-CoV-2 virus that causes COVID-19. Institutional protocols and algorithms that pertain to the evaluation of patients at risk for COVID-19 are in a state of rapid change based on information released by regulatory bodies including the CDC and federal and state organizations. These policies and algorithms were followed during the patient's care in the ED.  Patient was seen wearing N95, face shield, gloves.    Ethel Meisenheimer, Delice Bison, DO 04/04/19 0316    Aodhan Scheidt, Delice Bison, DO 04/04/19 [phone]

## 2019-04-04 NOTE — Discharge Instructions (Addendum)
I recommend that you keep your hand elevated above the level of your heart during the day and while sleeping at night.  I recommend ice for 10 to 15 minutes at a time several times a day.  I recommend follow-up with your primary care physician in 1 week for recheck.  We are putting you on antibiotics as you have developed some redness around the knuckles of the left hand with some warmth.  Please take your antibiotics as prescribed until complete.  I recommend that she start a probiotic while on antibiotics.  You may take Tylenol 1000 mg every 6 hours as needed for pain.

## 2019-04-06 ENCOUNTER — Telehealth: Payer: Self-pay | Admitting: *Deleted

## 2019-04-06 NOTE — Telephone Encounter (Signed)
[name], NP  Rafael Bihari A, CMA  Please call the patient and follow up, he Steven Guzman need to be seen in office depending on how he is doing.    LMOM for patient to return call.

## 2019-04-11 ENCOUNTER — Encounter: Payer: Self-pay | Admitting: Nurse Practitioner

## 2019-04-11 NOTE — Telephone Encounter (Signed)
LMOM to return call.

## 2019-04-13 ENCOUNTER — Ambulatory Visit (INDEPENDENT_AMBULATORY_CARE_PROVIDER_SITE_OTHER): Payer: Medicare Other | Admitting: *Deleted

## 2019-04-13 DIAGNOSIS — I5022 Chronic systolic (congestive) heart failure: Secondary | ICD-10-CM

## 2019-04-14 NOTE — Telephone Encounter (Signed)
Patient stated that he is doing well and doesn't think he needs to see [place]. Stated that he has a dermatologist appointment on 04/18/2019. Will call us if needed

## 2019-04-15 LAB — CUP PACEART REMOTE DEVICE CHECK
Battery Remaining Longevity: 30 mo
Battery Remaining Percentage: 38 %
Brady Statistic RA Percent Paced: 5 %
Brady Statistic RV Percent Paced: 0 %
Date Time Interrogation Session: 20210115133800
HighPow Impedance: 59 Ohm
Implantable Lead Implant Date: 20110610
Implantable Lead Implant Date: 20110610
Implantable Lead Location: 753859
Implantable Lead Location: 753860
Implantable Lead Model: 185
Implantable Lead Model: 4135
Implantable Lead Serial Number: 28681386
Implantable Lead Serial Number: 339643
Implantable Pulse Generator Implant Date: 20110610
Lead Channel Impedance Value: 445 Ohm
Lead Channel Impedance Value: 662 Ohm
Lead Channel Pacing Threshold Amplitude: 0.9 V
Lead Channel Pacing Threshold Amplitude: 0.9 V
Lead Channel Pacing Threshold Pulse Width: 0.4 ms
Lead Channel Pacing Threshold Pulse Width: 0.4 ms
Lead Channel Setting Pacing Amplitude: 2 V
Lead Channel Setting Pacing Amplitude: 2 V
Lead Channel Setting Pacing Pulse Width: 0.4 ms
Lead Channel Setting Sensing Sensitivity: 0.6 mV
Pulse Gen Serial Number: 164892

## 2019-04-18 DIAGNOSIS — D489 Neoplasm of uncertain behavior, unspecified: Secondary | ICD-10-CM | POA: Diagnosis not present

## 2019-04-18 DIAGNOSIS — D485 Neoplasm of uncertain behavior of skin: Secondary | ICD-10-CM | POA: Diagnosis not present

## 2019-04-18 DIAGNOSIS — D0462 Carcinoma in situ of skin of left upper limb, including shoulder: Secondary | ICD-10-CM | POA: Diagnosis not present

## 2019-04-18 DIAGNOSIS — C44629 Squamous cell carcinoma of skin of left upper limb, including shoulder: Secondary | ICD-10-CM | POA: Diagnosis not present

## 2019-04-19 ENCOUNTER — Ambulatory Visit: Payer: Medicare Other | Attending: Internal Medicine

## 2019-04-19 DIAGNOSIS — Z23 Encounter for immunization: Secondary | ICD-10-CM | POA: Insufficient documentation

## 2019-04-19 NOTE — Progress Notes (Signed)
   Covid-19 Vaccination Clinic  Name:  Steven Guzman    MRN: [id number] DOB: July 15, 1940  04/19/2019  Steven Guzman was observed post Covid-19 immunization for 15 minutes without incidence. He was provided with Vaccine Information Sheet and instruction to access the V-Safe system.   Steven Guzman was instructed to call 911 with any severe reactions post vaccine: [name] Difficulty breathing  . Swelling of your face and throat  . A fast heartbeat  . A bad rash all over your body  . Dizziness and weakness    Immunizations Administered    Name Date Dose VIS Date Route   Pfizer COVID-19 Vaccine 04/19/2019  1:20 PM 0.3 mL 03/10/2019 Intramuscular   Manufacturer: [employer]   Lot: [id number]   [place]: [id number]

## 2019-04-20 ENCOUNTER — Telehealth: Payer: Self-pay | Admitting: Interventional Cardiology

## 2019-04-20 DIAGNOSIS — I10 Essential (primary) hypertension: Secondary | ICD-10-CM | POA: Diagnosis not present

## 2019-04-20 DIAGNOSIS — R42 Dizziness and giddiness: Secondary | ICD-10-CM | POA: Diagnosis not present

## 2019-04-20 NOTE — Telephone Encounter (Signed)
  Pt c/o BP issue: STAT if pt c/o blurred vision, one-sided weakness or slurred speech  1. What are your last 5 BP readings?  Today: 127/64 Last night: 215/111, 198/107  2. Are you having any other symptoms (ex. Dizziness, headache, blurred vision, passed out)? Dizziness and blurred vision last night  3. What is your BP issue? Patient had very high BP last night with dizziness and blurred vision. He states he took some extra BP medication and called 911 and when the EMTs arrived his dizziness subsided. He would like a call back for advice.

## 2019-04-20 NOTE — Telephone Encounter (Signed)
Pt states last night he had terrible dizziness and blurred vision.  Checked BP and it was 215/111.  Waited a little bit and checked again and it was 198/107.  Pt took Benazepril 10mg  and called 911.  By the time EMS arrived, dizziness and blurred vision had resolved.  Pt states EMS told him his EKG was normal.  Pt states only thing he did differently yesterday is he got his covid vaccine.  Took morning meds as usual, which include Coreg 3.125mg , Furosemide 20mg  and Benazepril 5mg .  BP today at 3:20pm was 127/74.  Denies any issues today.  States he occasionally notices a HA between 9-10PM and BP will be 170s/80-90s.  States BP usually low 100s-120/60s.  Advised pt to take medications as prescribed and record BP at least 2 hours after medication over the next 4-5 days and send me those readings through MyChart on Tuesday next week.  Advised to also check if he develops a HA at night and send those as well.  Pt verbalized understanding and was in agreement with plan.  Will send to Dr. Tamala Julian to see if further recommendations.

## 2019-04-21 ENCOUNTER — Ambulatory Visit: Payer: Medicare Other

## 2019-04-21 NOTE — Telephone Encounter (Signed)
Agree with the recommendation.

## 2019-04-26 ENCOUNTER — Other Ambulatory Visit: Payer: Self-pay | Admitting: Internal Medicine

## 2019-04-27 DIAGNOSIS — F17210 Nicotine dependence, cigarettes, uncomplicated: Secondary | ICD-10-CM | POA: Diagnosis not present

## 2019-04-27 DIAGNOSIS — C44629 Squamous cell carcinoma of skin of left upper limb, including shoulder: Secondary | ICD-10-CM | POA: Diagnosis not present

## 2019-04-27 DIAGNOSIS — Z85828 Personal history of other malignant neoplasm of skin: Secondary | ICD-10-CM | POA: Diagnosis not present

## 2019-05-01 ENCOUNTER — Ambulatory Visit (INDEPENDENT_AMBULATORY_CARE_PROVIDER_SITE_OTHER): Payer: Medicare Other | Admitting: Internal Medicine

## 2019-05-01 ENCOUNTER — Other Ambulatory Visit: Payer: Self-pay

## 2019-05-01 VITALS — BP 110/76 | HR 67 | Ht 72.0 in | Wt 187.0 lb

## 2019-05-01 DIAGNOSIS — I480 Paroxysmal atrial fibrillation: Secondary | ICD-10-CM

## 2019-05-01 DIAGNOSIS — Z95810 Presence of automatic (implantable) cardiac defibrillator: Secondary | ICD-10-CM

## 2019-05-01 DIAGNOSIS — I472 Ventricular tachycardia, unspecified: Secondary | ICD-10-CM

## 2019-05-01 DIAGNOSIS — I5022 Chronic systolic (congestive) heart failure: Secondary | ICD-10-CM | POA: Diagnosis not present

## 2019-05-01 NOTE — Progress Notes (Signed)
HPI [name] returns today for ongoing followup of his VT and PAF. He is a pleasant 79 yo man with a h/o COPD, CAD, and anemia. He has been over a year since being seen in our EP clinic. He has not had any ICD shocks. He has class 2 dyspnea which is multifactorial. He has not had syncope. He is still smoking.  Allergies  Allergen Reactions  . Sulfa Antibiotics Hives     Current Outpatient Medications  Medication Sig Dispense Refill  . albuterol (PROVENTIL) (2.5 MG/3ML) 0.083% nebulizer solution Take 3 mLs (2.5 mg total) by nebulization every 6 (six) hours as needed for wheezing or shortness of breath. 360 mL 5  . amiodarone (PACERONE) 200 MG tablet TAKE 1 TABLET(200 MG) BY MOUTH DAILY 90 tablet 3  . Artificial Tear Solution (SOOTHE XP OP) Place 2 drops into both eyes daily as needed (dry eyes).    [name] aspirin EC 81 MG tablet Take 1 tablet (81 mg total) by mouth every Monday, Wednesday, and Friday. 90 tablet 3  . atorvastatin (LIPITOR) 80 MG tablet TAKE 1 TABLET(80 MG) BY MOUTH DAILY 90 tablet 2  . benazepril (LOTENSIN) 10 MG tablet Take 5 mg by mouth daily.    . budesonide-formoterol (SYMBICORT) 160-4.5 MCG/ACT inhaler Inhale 2 puffs into the lungs 2 (two) times daily. 1 Inhaler 5  . buPROPion (WELLBUTRIN) 75 MG tablet TAKE 1 TABLET(75 MG) BY MOUTH DAILY 90 tablet 1  . busPIRone (BUSPAR) 15 MG tablet TAKE 1 TABLET(15 MG) BY MOUTH THREE TIMES DAILY 90 tablet 0  . carvedilol (COREG) 3.125 MG tablet TAKE 1 TABLET(3.125 MG) BY MOUTH TWICE DAILY 180 tablet 0  . cephALEXin (KEFLEX) 500 MG capsule Take 1 capsule (500 mg total) by mouth 4 (four) times daily. (Patient taking differently: Take 500 mg by mouth 2 (two) times daily. ) 28 capsule 0  . Choline Fenofibrate (FENOFIBRIC ACID) 135 MG CPDR TAKE 1 CAPSULE BY MOUTH DAILY 30 capsule 5  . escitalopram (LEXAPRO) 20 MG tablet TAKE 1 TABLET(20 MG) BY MOUTH DAILY 90 tablet 1  . fluticasone (FLONASE) 50 MCG/ACT nasal spray INSTILL 2 SPRAYS INTO  EACH NOSTRIL QD    . furosemide (LASIX) 40 MG tablet TAKE 1/2 TABLET(20 MG) BY MOUTH DAILY 45 tablet 3  . Ipratropium-Albuterol (COMBIVENT RESPIMAT) 20-100 MCG/ACT AERS respimat Inhale 1 puff into the lungs every 6 (six) hours. Shortness of breath or wheezing 4 g 5  . IRON PO Take by mouth daily.     [name] MAGNESIUM-OXIDE 400 (241.3 Mg) MG tablet TAKE 1 TABSULE BY MOUTH DAILY 90 tablet 3  . mexiletine (MEXITIL) 200 MG capsule TAKE 1 CAPSULE(200 MG) BY MOUTH TWICE DAILY 180 capsule 1  . Multiple Vitamins-Minerals (CENTRUM ADULTS PO) Take by mouth daily.    . nitroGLYCERIN (NITROSTAT) 0.4 MG SL tablet DISSOLVE 1 TABLET UNDER THE TONGUE EVERY 5 MINUTES FOR 3 DOSES AS NEEDED FOR CHEST PAIN 25 tablet 6  . omeprazole (PRILOSEC) 20 MG capsule Take 20 mg by mouth daily.     . potassium chloride SA (K-DUR) 20 MEQ tablet TAKE 1 TABLET(20 MEQ) BY MOUTH DAILY 90 tablet 1  . Probiotic CAPS Take 1 capsule by mouth daily. 30 capsule 1  . Respiratory Therapy Supplies (FLUTTER) DEVI Use as directed 1 each 0  . senna (SENOKOT) 8.6 MG tablet Take 1-2 tablets by mouth daily.    . tamsulosin (FLOMAX) 0.4 MG CAPS capsule TAKE 1 CAPSULE(0.4 MG) BY MOUTH DAILY AFTER  SUPPER 90 capsule 0  . Tiotropium Bromide Monohydrate (SPIRIVA RESPIMAT) 2.5 MCG/ACT AERS Inhale 2 puffs into the lungs daily. 4 g 3  . vitamin B-12 (CYANOCOBALAMIN) 1000 MCG tablet Take 1 tablet (1,000 mcg total) by mouth daily. 30 tablet 3  . XARELTO 20 MG TABS tablet TAKE 1 TABLET BY MOUTH EVERY DAY WITH SUPPER 30 tablet 10   No current facility-administered medications for this visit.     Past Medical History:  Diagnosis Date  . AICD (automatic cardioverter/defibrillator) present   . Aneurysm ([place])    a. Aneurysmal infrarenal aorta up to 33 mm on CT 10/2014, recommended f/u due 10/2017  . Anginal pain ([place])   . Anxiety   . Basal cell carcinoma of nose    S/P MOHS  . Biliary acute pancreatitis   . CAD (coronary artery disease)    a. s/p MI in 1994  with PCI to LAD at that time b. cath 10/2012 demonstrated EF 30%, inferior akinesis with mild hypokinesis of all walls, patent LAD and RCA stents; ostial PDA with 80-90% obstruction with medical therapy recommended   . Chronic systolic CHF (congestive heart failure) ([facility])    EF 30 to 35 % as of 09/2014.   [name] CKD (chronic kidney disease), stage III   . Complication of anesthesia 10/2014   "had to have defibrillator w/ERCP"  . COPD (chronic obstructive pulmonary disease) ([place])    a. followed by pulmonary, COPD GOLD stage II  . Depression   . Diverticulosis of colon 07/2014   noted on CT  . GERD (gastroesophageal reflux disease)   . Hiatal hernia   . Hyperglycemia 10/2012.  [name] Hyperlipidemia   . Hypertension   . Myocardial infarction [facility]) 1994; 2011  . Pneumonia 1946; 2015  . Prostate enlargement 07/2014   observed on CT  . Tobacco abuse   . Ventricular tachycardia ([place])    a. 08/2009 s/p BSX E110 Teligen 100 AICD, ser#: [id number];  b. 08/2008 VT req ATP - detection reprogrammed from 160 to 150. c. EPS and VT ablation by Dr. Lovena Le 12/21/2014    ROS:   All systems reviewed and negative except as noted in the HPI.   Past Surgical History:  Procedure Laterality Date  . BIOPSY  12/21/2017   Procedure: BIOPSY;  Surgeon: [name], MD;  Location: [facility];  Service: Endoscopy;;  . CATARACT EXTRACTION W/ INTRAOCULAR LENS  IMPLANT, BILATERAL Bilateral ~ 2011  . COLONOSCOPY    . COLONOSCOPY WITH PROPOFOL N/A 12/21/2017   Procedure: COLONOSCOPY WITH PROPOFOL;  Surgeon: [name], MD;  Location: [facility];  Service: Endoscopy;  Laterality: N/A;  . ELECTROPHYSIOLOGIC STUDY N/A 12/21/2014   Procedure: V Tach Ablation;  Surgeon: [name], MD;  Location: [facility];  Service: Cardiovascular;  Laterality: N/A;  . ERCP N/A 11/16/2014   Procedure: ENDOSCOPIC RETROGRADE CHOLANGIOPANCREATOGRAPHY (ERCP);  Surgeon: [name], MD;  Location: [facility];  Service: Endoscopy;   Laterality: N/A;  . ESOPHAGOGASTRODUODENOSCOPY (EGD) WITH PROPOFOL N/A 12/21/2017   Procedure: ESOPHAGOGASTRODUODENOSCOPY (EGD) WITH PROPOFOL;  Surgeon: [name], MD;  Location: [facility];  Service: Endoscopy;  Laterality: N/A;  . EYE SURGERY    . FOOT SURGERY Left 2005   "fixed bone that stuck out in my ankle area"  . HEMORRHOID BANDING    . IMPLANTABLE CARDIOVERTER DEFIBRILLATOR IMPLANT  09/06/09   BSX dual chamber ICD implanted in [place] for cardiac arrest 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  . INGUINAL  HERNIA REPAIR Right ~ 1995  . LEFT HEART CATHETERIZATION WITH CORONARY ANGIOGRAM N/A 11/25/2012   demonstrated EF 30%, inferior akinesis with mild hypokinesis of all walls, patent LAD and RCA stents; ostial PDA with 80-90% obstruction with medical therapy recommended  . MOHS SURGERY  2008   nose, skin graft  . POLYPECTOMY  12/21/2017   Procedure: POLYPECTOMY;  Surgeon: [name], MD;  Location: [facility];  Service: Endoscopy;;  . RETINAL DETACHMENT SURGERY Right 2013  . TENOLYSIS Right 12/21/2013   Procedure: TENOLYSIS FLEXOR CARPI RADIALIS ,DEBRIDEMENT RIGHT JOINT WRIST,DEBRIDEMENT SCAPHOTRAPEZIAL TRAPEZOID, REPAIR OF EXTENSOR HOOD;  Surgeon: Daryll Brod, MD;  Location: [facility];  Service: Orthopedics;  Laterality: Right;  . V-TACH ABLATION  12/21/2014  . VIDEO BRONCHOSCOPY Bilateral 01/09/2016   Procedure: VIDEO BRONCHOSCOPY WITHOUT FLUORO;  Surgeon: Juanito Doom, MD;  Location: [facility];  Service: Cardiopulmonary;  Laterality: Bilateral;     Family History  Problem Relation Age of Onset  . Heart attack Brother   . CAD Father   . Hypertension Father   . CAD Mother   . Hypertension Mother   . Hypertension Brother   . Stroke Neg Hx      Social History   Socioeconomic History  . Marital status: Married    Spouse name: Not on file  . Number of children: Not on file  . Years of education: Not on file  . Highest education level: Not on file    Occupational History  . Occupation: Retired  Tobacco Use  . Smoking status: Current Every Day Smoker    Packs/day: 1.00    Years: 55.00    Pack years: 55.00    Types: Cigarettes  . Smokeless tobacco: Never Used  . Tobacco comment: always ready to quit but does not work out  Substance and Sexual Activity  . Alcohol use: Yes    Alcohol/week: 0.0 standard drinks    Comment: occ  . Drug use: No  . Sexual activity: Not Currently  Other Topics Concern  . Not on file  Social History Narrative  . Not on file   Social Determinants of Health   Financial Resource Strain:   . Difficulty of Paying Living Expenses: Not on file  Food Insecurity:   . Worried About Charity fundraiser in the Last Year: Not on file  . Ran Out of Food in the Last Year: Not on file  Transportation Needs:   . Lack of Transportation (Medical): Not on file  . Lack of Transportation (Non-Medical): Not on file  Physical Activity:   . Days of Exercise per Week: Not on file  . Minutes of Exercise per Session: Not on file  Stress:   . Feeling of Stress : Not on file  Social Connections:   . Frequency of Communication with Friends and Family: Not on file  . Frequency of Social Gatherings with Friends and Family: Not on file  . Attends Religious Services: Not on file  . Active Member of Clubs or Organizations: Not on file  . Attends Archivist Meetings: Not on file  . Marital Status: Not on file  Intimate Partner Violence:   . Fear of Current or Ex-Partner: Not on file  . Emotionally Abused: Not on file  . Physically Abused: Not on file  . Sexually Abused: Not on file     BP 110/76   Pulse 67   Ht 6' (1.829 m)   Wt 187 lb (84.8 kg)  BMI 25.36 kg/m   Physical Exam:  Well appearing NAD HEENT: Unremarkable Neck:  No JVD, no thyromegally Lymphatics:  No adenopathy Back:  No CVA tenderness Lungs:  Clear with no wheezes HEART:  Regular rate rhythm, no murmurs, no rubs, no clicks Abd:   soft, positive bowel sounds, no organomegally, no rebound, no guarding Ext:  2 plus pulses, no edema, no cyanosis, no clubbing Skin:  No rashes no nodules Neuro:  CN II through XII intact, motor grossly intact  EKG - NSR  DEVICE  Normal device function.  See PaceArt for details.   Assess/Plan: 1. VT - he has had no sustained VT since his last clinic visit. He will continue his amiodarone and mexilitine 2. ICD - his [employer] DDD ICD is working normally.  3. Tobacco abuse - he has copd and is still smoking a ppd. I strongly encouraged him to stop smoking. 4. PAF - he appears to be maintaining NSR on amiodarone. We will continue.  Mikle Bosworth.D.

## 2019-05-01 NOTE — Patient Instructions (Signed)
Medication Instructions:  Your physician recommends that you continue on your current medications as directed. Please refer to the Current Medication list given to you today.  Labwork: None ordered.  Testing/Procedures: None ordered.  Follow-Up: Your physician wants you to follow-up in: one year with Dr. Lovena Le.   You will receive a reminder letter in the mail two months in advance. If you don't receive a letter, please call our office to schedule the follow-up appointment.  Remote monitoring is used to monitor your ICD from home. This monitoring reduces the number of office visits required to check your device to one time per year. It allows [place] to keep an eye on the functioning of your device to ensure it is working properly. You are scheduled for a device check from home on 07/13/2019. You may send your transmission at any time that day. If you have a wireless device, the transmission will be sent automatically.   Any Other Special Instructions Will Be Listed Below (If Applicable).  If you need a refill on your cardiac medications before your next appointment, please call your pharmacy.

## 2019-05-02 ENCOUNTER — Other Ambulatory Visit: Payer: Self-pay | Admitting: *Deleted

## 2019-05-02 MED ORDER — POTASSIUM CHLORIDE CRYS ER 20 MEQ PO TBCR: EXTENDED_RELEASE_TABLET | ORAL | 0 refills | Status: DC

## 2019-05-02 NOTE — Telephone Encounter (Signed)
Patient requested refill

## 2019-05-05 ENCOUNTER — Other Ambulatory Visit: Payer: Self-pay | Admitting: Internal Medicine

## 2019-05-05 ENCOUNTER — Other Ambulatory Visit: Payer: Self-pay | Admitting: Pulmonary Disease

## 2019-05-05 DIAGNOSIS — M7918 Myalgia, other site: Secondary | ICD-10-CM | POA: Diagnosis not present

## 2019-05-05 DIAGNOSIS — M546 Pain in thoracic spine: Secondary | ICD-10-CM | POA: Diagnosis not present

## 2019-05-05 DIAGNOSIS — M47892 Other spondylosis, cervical region: Secondary | ICD-10-CM | POA: Diagnosis not present

## 2019-05-05 DIAGNOSIS — M8588 Other specified disorders of bone density and structure, other site: Secondary | ICD-10-CM | POA: Diagnosis not present

## 2019-05-05 DIAGNOSIS — M47894 Other spondylosis, thoracic region: Secondary | ICD-10-CM | POA: Diagnosis not present

## 2019-05-05 DIAGNOSIS — M47814 Spondylosis without myelopathy or radiculopathy, thoracic region: Secondary | ICD-10-CM | POA: Diagnosis not present

## 2019-05-08 DIAGNOSIS — Z4802 Encounter for removal of sutures: Secondary | ICD-10-CM | POA: Diagnosis not present

## 2019-05-08 DIAGNOSIS — C44629 Squamous cell carcinoma of skin of left upper limb, including shoulder: Secondary | ICD-10-CM | POA: Diagnosis not present

## 2019-05-08 DIAGNOSIS — Z483 Aftercare following surgery for neoplasm: Secondary | ICD-10-CM | POA: Diagnosis not present

## 2019-05-09 DIAGNOSIS — L570 Actinic keratosis: Secondary | ICD-10-CM | POA: Diagnosis not present

## 2019-05-09 DIAGNOSIS — Z85828 Personal history of other malignant neoplasm of skin: Secondary | ICD-10-CM | POA: Diagnosis not present

## 2019-05-10 ENCOUNTER — Ambulatory Visit: Payer: Medicare Other | Attending: Internal Medicine

## 2019-05-10 DIAGNOSIS — Z23 Encounter for immunization: Secondary | ICD-10-CM | POA: Insufficient documentation

## 2019-05-10 NOTE — Progress Notes (Signed)
   Covid-19 Vaccination Clinic  Name:  Steven Guzman    MRN: [id number] DOB: [birth year]  05/10/2019  Mr. Scherff was observed post Covid-19 immunization for 15 minutes without incidence. He was provided with Vaccine Information Sheet and instruction to access the V-Safe system.   Mr. Arnet was instructed to call 911 with any severe reactions post vaccine: [name] Difficulty breathing  . Swelling of your face and throat  . A fast heartbeat  . A bad rash all over your body  . Dizziness and weakness    Immunizations Administered    Name Date Dose VIS Date Route   Pfizer COVID-19 Vaccine 05/10/2019 10:15 AM 0.3 mL 03/10/2019 Intramuscular   Manufacturer: [employer]   Lot: [id number]   [place]: [id number]

## 2019-05-24 ENCOUNTER — Other Ambulatory Visit: Payer: Self-pay | Admitting: Interventional Cardiology

## 2019-05-24 ENCOUNTER — Other Ambulatory Visit: Payer: Self-pay | Admitting: Nurse Practitioner

## 2019-05-25 DIAGNOSIS — D692 Other nonthrombocytopenic purpura: Secondary | ICD-10-CM | POA: Diagnosis not present

## 2019-05-25 DIAGNOSIS — D489 Neoplasm of uncertain behavior, unspecified: Secondary | ICD-10-CM | POA: Diagnosis not present

## 2019-05-25 DIAGNOSIS — L853 Xerosis cutis: Secondary | ICD-10-CM | POA: Diagnosis not present

## 2019-05-28 ENCOUNTER — Other Ambulatory Visit: Payer: Self-pay | Admitting: Internal Medicine

## 2019-05-30 NOTE — Progress Notes (Signed)
Cardiology Office Note:    Date:  05/31/2019   ID:  Steven, Guzman 02-Dec-1940, MRN [id number]  PCP:  [name], NP  Cardiologist:  Sinclair Grooms, MD   Referring MD: [name], NP   Chief Complaint  Patient presents with  . Congestive Heart Failure  . Coronary Artery Disease  . Dizziness    History of Present Illness:    Steven Guzman is a 79 y.o. male with a hx of ischemic cardiomyopathy with EF 35%, COPD, hypertension, recent low blood pressures, CAD with prior coronary bypass grafting, continued tobacco abuse,PAF,ventricular tachycardiacontrolled on amiodarone and Mexitil, and implantable cardiac defibrillator.  Several weeks ago, he had an episode of severe dizziness associated with extreme elevation in blood pressure.  Blood pressures tend to run relatively low.  States he had taken his medications consistently.  He has monitored blood pressures since that time and they have been less than 135/80 mmHg.  No recurrence of dizziness.  He is having some difficulty with walking due to balance.  No obvious focal deficit.  Past Medical History:  Diagnosis Date  . AICD (automatic cardioverter/defibrillator) present   . Aneurysm ([place])    a. Aneurysmal infrarenal aorta up to 33 mm on CT 10/2014, recommended f/u due 10/2017  . Anginal pain ([place])   . Anxiety   . Basal cell carcinoma of nose    S/P MOHS  . Biliary acute pancreatitis   . CAD (coronary artery disease)    a. s/p MI in 1994 with PCI to LAD at that time b. cath 10/2012 demonstrated EF 30%, inferior akinesis with mild hypokinesis of all walls, patent LAD and RCA stents; ostial PDA with 80-90% obstruction with medical therapy recommended   . Chronic systolic CHF (congestive heart failure) (HCC)    EF 30 to 35 % as of 09/2014.   [name] CKD (chronic kidney disease), stage III   . Complication of anesthesia 10/2014   "had to have defibrillator w/ERCP"  . COPD (chronic obstructive pulmonary disease) ([place])     a. followed by pulmonary, COPD GOLD stage II  . Depression   . Diverticulosis of colon 07/2014   noted on CT  . GERD (gastroesophageal reflux disease)   . Hiatal hernia   . Hyperglycemia 10/2012.  [name] Hyperlipidemia   . Hypertension   . Myocardial infarction [facility]) 1994; 2011  . Pneumonia 1946; 2015  . Prostate enlargement 07/2014   observed on CT  . Tobacco abuse   . Ventricular tachycardia ([place])    a. 08/2009 s/p BSX E110 Teligen 100 AICD, ser#: [id number];  b. 08/2008 VT req ATP - detection reprogrammed from 160 to 150. c. EPS and VT ablation by Dr. Lovena Le 12/21/2014    Past Surgical History:  Procedure Laterality Date  . BIOPSY  12/21/2017   Procedure: BIOPSY;  Surgeon: [name], MD;  Location: [facility];  Service: Endoscopy;;  . CATARACT EXTRACTION W/ INTRAOCULAR LENS  IMPLANT, BILATERAL Bilateral ~ 2011  . COLONOSCOPY    . COLONOSCOPY WITH PROPOFOL N/A 12/21/2017   Procedure: COLONOSCOPY WITH PROPOFOL;  Surgeon: [name], MD;  Location: [facility] ENDOSCOPY;  Service: Endoscopy;  Laterality: N/A;  . ELECTROPHYSIOLOGIC STUDY N/A 12/21/2014   Procedure: V Tach Ablation;  Surgeon: [name], MD;  Location: [facility];  Service: Cardiovascular;  Laterality: N/A;  . ERCP N/A 11/16/2014   Procedure: ENDOSCOPIC RETROGRADE CHOLANGIOPANCREATOGRAPHY (ERCP);  Surgeon: [name], MD;  Location[facility]  ENDOSCOPY;  Service: Endoscopy;  Laterality: N/A;  . ESOPHAGOGASTRODUODENOSCOPY (EGD) WITH PROPOFOL N/A 12/21/2017   Procedure: ESOPHAGOGASTRODUODENOSCOPY (EGD) WITH PROPOFOL;  Surgeon: [name], MD;  Location: [facility] ENDOSCOPY;  Service: Endoscopy;  Laterality: N/A;  . EYE SURGERY    . FOOT SURGERY Left 2005   "fixed bone that stuck out in my ankle area"  . HEMORRHOID BANDING    . IMPLANTABLE CARDIOVERTER DEFIBRILLATOR IMPLANT  09/06/09   BSX dual chamber ICD implanted in [place] for cardiac arrest 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  . INGUINAL HERNIA REPAIR Right ~ 1995  . LEFT HEART  CATHETERIZATION WITH CORONARY ANGIOGRAM N/A 11/25/2012   demonstrated EF 30%, inferior akinesis with mild hypokinesis of all walls, patent LAD and RCA stents; ostial PDA with 80-90% obstruction with medical therapy recommended  . MOHS SURGERY  2008   nose, skin graft  . POLYPECTOMY  12/21/2017   Procedure: POLYPECTOMY;  Surgeon: [name], MD;  Location: [facility];  Service: Endoscopy;;  . RETINAL DETACHMENT SURGERY Right 2013  . TENOLYSIS Right 12/21/2013   Procedure: TENOLYSIS FLEXOR CARPI RADIALIS ,DEBRIDEMENT RIGHT JOINT WRIST,DEBRIDEMENT SCAPHOTRAPEZIAL TRAPEZOID, REPAIR OF EXTENSOR HOOD;  Surgeon: Daryll Brod, MD;  Location: [facility];  Service: Orthopedics;  Laterality: Right;  . V-TACH ABLATION  12/21/2014  . VIDEO BRONCHOSCOPY Bilateral 01/09/2016   Procedure: VIDEO BRONCHOSCOPY WITHOUT FLUORO;  Surgeon: Juanito Doom, MD;  Location: [facility] ENDOSCOPY;  Service: Cardiopulmonary;  Laterality: Bilateral;    Current Medications: Current Meds  Medication Sig  . albuterol (PROVENTIL) (2.5 MG/3ML) 0.083% nebulizer solution Take 3 mLs (2.5 mg total) by nebulization every 6 (six) hours as needed for wheezing or shortness of breath.  [name] amiodarone (PACERONE) 200 MG tablet TAKE 1 TABLET(200 MG) BY MOUTH DAILY  . Artificial Tear Solution (SOOTHE XP OP) Place 2 drops into both eyes daily as needed (dry eyes).  [name] aspirin EC 81 MG tablet Take 1 tablet (81 mg total) by mouth every Monday, Wednesday, and Friday.  [name] atorvastatin (LIPITOR) 80 MG tablet TAKE 1 TABLET(80 MG) BY MOUTH DAILY  . benazepril (LOTENSIN) 10 MG tablet Take 5 mg by mouth daily.  . budesonide-formoterol (SYMBICORT) 160-4.5 MCG/ACT inhaler Inhale 2 puffs into the lungs 2 (two) times daily.  [name] buPROPion (WELLBUTRIN) 75 MG tablet TAKE 1 TABLET(75 MG) BY MOUTH DAILY  . busPIRone (BUSPAR) 15 MG tablet TAKE 1 TABLET(15 MG) BY MOUTH THREE TIMES DAILY  . carvedilol (COREG) 3.125 MG tablet TAKE 1 TABLET(3.125 MG) BY  MOUTH TWICE DAILY  . Choline Fenofibrate (FENOFIBRIC ACID) 135 MG CPDR TAKE 1 CAPSULE BY MOUTH DAILY  . escitalopram (LEXAPRO) 20 MG tablet TAKE 1 TABLET(20 MG) BY MOUTH DAILY  . fluticasone (FLONASE) 50 MCG/ACT nasal spray INSTILL 2 SPRAYS INTO EACH NOSTRIL QD  . furosemide (LASIX) 40 MG tablet TAKE 1/2 TABLET(20 MG) BY MOUTH DAILY  . Ipratropium-Albuterol (COMBIVENT RESPIMAT) 20-100 MCG/ACT AERS respimat Inhale 1 puff into the lungs every 6 (six) hours. Shortness of breath or wheezing  . IRON PO Take by mouth daily.   [name] MAGNESIUM-OXIDE 400 (241.3 Mg) MG tablet TAKE 1 TABSULE BY MOUTH DAILY  . mexiletine (MEXITIL) 200 MG capsule TAKE 1 CAPSULE(200 MG) BY MOUTH TWICE DAILY  . Multiple Vitamins-Minerals (CENTRUM ADULTS PO) Take by mouth daily.  . nitroGLYCERIN (NITROSTAT) 0.4 MG SL tablet DISSOLVE 1 TABLET UNDER THE TONGUE EVERY 5 MINUTES FOR 3 DOSES AS NEEDED FOR CHEST PAIN  . omeprazole (PRILOSEC) 20 MG capsule Take 20 mg  by mouth daily.   . potassium chloride SA (KLOR-CON) 20 MEQ tablet Take one tablet by mouth once daily  . Respiratory Therapy Supplies (FLUTTER) DEVI Use as directed  . senna (SENOKOT) 8.6 MG tablet Take 1-2 tablets by mouth daily.  [name] SPIRIVA RESPIMAT 2.5 MCG/ACT AERS INHALE 2 PUFFS INTO THE LUNGS DAILY  . tamsulosin (FLOMAX) 0.4 MG CAPS capsule TAKE 1 CAPSULE(0.4 MG) BY MOUTH DAILY AFTER SUPPER  . vitamin B-12 (CYANOCOBALAMIN) 1000 MCG tablet Take 1 tablet (1,000 mcg total) by mouth daily.  Alveda Reasons 20 MG TABS tablet TAKE 1 TABLET BY MOUTH EVERY DAY WITH SUPPER     Allergies:   Sulfa antibiotics   Social History   Socioeconomic History  . Marital status: Married    Spouse name: Not on file  . Number of children: Not on file  . Years of education: Not on file  . Highest education level: Not on file  Occupational History  . Occupation: Retired  Tobacco Use  . Smoking status: Current Every Day Smoker    Packs/day: 1.00    Years: 55.00    Pack years: 55.00     Types: Cigarettes  . Smokeless tobacco: Never Used  . Tobacco comment: always ready to quit but does not work out  Substance and Sexual Activity  . Alcohol use: Yes    Alcohol/week: 0.0 standard drinks    Comment: occ  . Drug use: No  . Sexual activity: Not Currently  Other Topics Concern  . Not on file  Social History Narrative  . Not on file   Social Determinants of Health   Financial Resource Strain:   . Difficulty of Paying Living Expenses: Not on file  Food Insecurity:   . Worried About Charity fundraiser in the Last Year: Not on file  . Ran Out of Food in the Last Year: Not on file  Transportation Needs:   . Lack of Transportation (Medical): Not on file  . Lack of Transportation (Non-Medical): Not on file  Physical Activity:   . Days of Exercise per Week: Not on file  . Minutes of Exercise per Session: Not on file  Stress:   . Feeling of Stress : Not on file  Social Connections:   . Frequency of Communication with Friends and Family: Not on file  . Frequency of Social Gatherings with Friends and Family: Not on file  . Attends Religious Services: Not on file  . Active Member of Clubs or Organizations: Not on file  . Attends Archivist Meetings: Not on file  . Marital Status: Not on file     Family History: The patient's family history includes CAD in his father and mother; Heart attack in his brother; Hypertension in his brother, father, and mother. There is no history of Stroke.  ROS:   Please see the history of present illness.    Severe back pain.  Occasional opiate use because of the back discomfort.  Continues to smoke.  Chronic cough.  All other systems reviewed and are negative.  EKGs/Labs/Other Studies Reviewed:    The following studies were reviewed today: No new data  EKG:  EKG not repeated  Recent Labs: 09/28/2018: Magnesium 1.7; TSH 1.97 02/27/2019: ALT 16; BUN 11; Creat 0.97; Hemoglobin 13.2; Platelets 212; Potassium 4.3; Sodium 139    Recent Lipid Panel    Component Value Date/Time   CHOL 142 02/27/2019 1056   CHOL 157 09/21/2017 1546   TRIG 79 02/27/2019 1056  HDL 48 02/27/2019 1056   HDL 46 09/21/2017 1546   CHOLHDL 3.0 02/27/2019 1056   VLDL 16.6 12/29/2016 1618   LDLCALC 78 02/27/2019 1056    Physical Exam:    VS:  BP 102/68   Pulse 79   Ht 6' (1.829 m)   Wt 190 lb 12.8 oz (86.5 kg)   SpO2 98%   BMI 25.88 kg/m     Wt Readings from Last 3 Encounters:  05/31/19 190 lb 12.8 oz (86.5 kg)  05/01/19 187 lb (84.8 kg)  04/03/19 189 lb 9.5 oz (86 kg)     GEN: Chronically ill appearing older than stated age.  Ecchymoses noted on arms. No acute distress HEENT: Normal NECK: No JVD. LYMPHATICS: No lymphadenopathy CARDIAC:  RRR without murmur, gallop, or edema. VASCULAR:  Normal Pulses. No bruits. RESPIRATORY:  Clear to auscultation without rales, wheezing or rhonchi  ABDOMEN: Soft, non-tender, non-distended, No pulsatile mass, MUSCULOSKELETAL: No deformity  SKIN: Warm and dry NEUROLOGIC:  Alert and oriented x 3 PSYCHIATRIC:  Normal affect   ASSESSMENT:    1. Chronic systolic heart failure ([place])   2. Ventricular tachycardia ([place])   3. ICD (implantable cardioverter-defibrillator) in place   4. Paroxysmal atrial fibrillation (HCC)   5. Coronary artery disease involving native coronary artery of native heart without angina pectoris   6. Essential hypertension   7. Other hyperlipidemia   8. Long term current use of amiodarone   9. Chronic anticoagulation   10. Dizziness   11. Educated about COVID-19 virus infection    PLAN:    In order of problems listed above:  1. No evidence of volume overload 2. No recurrence 3. No treatment/discharge 4. Clinically in sinus rhythm 5. Secondary prevention discussed 6. Blood pressure is less than [id number] mmHg systolic today which is typical. 7. LDL target less than 70 8. Continue amiodarone therapy for both ventricular and atrial arrhythmia control 9. Continue  Eliquis to prevent embolic stroke 10. Dizziness is concerning and given longstanding smoking history, vascular disease, carotid Doppler study will be done to exclude significant obstruction as a potential source of his dizziness and imbalance. 11. 3W's and COVID-19 vaccine are discussed and advocated by the patient.  Overall education and awareness concerning primary/secondary risk prevention was discussed in detail: LDL less than 70, hemoglobin A1c less than 7, blood pressure target less than 130/80 mmHg, >150 minutes of moderate aerobic activity per week, avoidance of smoking, weight control (via diet and exercise), and continued surveillance/management of/for obstructive sleep apnea.    Medication Adjustments/Labs and Tests Ordered: Current medicines are reviewed at length with the patient today.  Concerns regarding medicines are outlined above.  Orders Placed This Encounter  Procedures  . VAS US CAROTID   No orders of the defined types were placed in this encounter.   Patient Instructions  Medication Instructions:  Your physician recommends that you continue on your current medications as directed. Please refer to the Current Medication list given to you today. *If you need a refill on your cardiac medications before your next appointment, please call your pharmacy*   Lab Work: None If you have labs (blood work) drawn today and your tests are completely normal, you will receive your results only by: [name] MyChart Message (if you have MyChart) OR . A paper copy in the mail If you have any lab test that is abnormal or we need to change your treatment, we will call you to review the results.   Testing/Procedures: Your physician  has requested that you have a carotid duplex. This test is an ultrasound of the carotid arteries in your neck. It looks at blood flow through these arteries that supply the brain with blood. Allow one hour for this exam. There are no restrictions or special  instructions.    Follow-Up: At [facility], you and your health needs are our priority.  As part of our continuing mission to provide you with exceptional heart care, we have created designated Provider Care Teams.  These Care Teams include your primary Cardiologist (physician) and Advanced Practice Providers (APPs -  Physician Assistants and Nurse Practitioners) who all work together to provide you with the care you need, when you need it.  We recommend signing up for the patient portal called "MyChart".  Sign up information is provided on this After Visit Summary.  MyChart is used to connect with patients for Virtual Visits (Telemedicine).  Patients are able to view lab/test results, encounter notes, upcoming appointments, etc.  Non-urgent messages can be sent to your provider as well.   To learn more about what you can do with MyChart, go to [email or web address].    Your next appointment:   6 month(s)  The format for your next appointment:   In Person  Provider:   Truitt Merle, NP, Cecilie Kicks, NP or Kathyrn Drown, NP    Other Instructions      Signed, Sinclair Grooms, MD  05/31/2019 3:17 PM    [place]

## 2019-05-31 ENCOUNTER — Encounter (INDEPENDENT_AMBULATORY_CARE_PROVIDER_SITE_OTHER): Payer: Self-pay

## 2019-05-31 ENCOUNTER — Other Ambulatory Visit: Payer: Self-pay

## 2019-05-31 ENCOUNTER — Ambulatory Visit (INDEPENDENT_AMBULATORY_CARE_PROVIDER_SITE_OTHER): Payer: Medicare Other | Admitting: Interventional Cardiology

## 2019-05-31 ENCOUNTER — Encounter: Payer: Self-pay | Admitting: Interventional Cardiology

## 2019-05-31 VITALS — BP 102/68 | HR 79 | Ht 72.0 in | Wt 190.8 lb

## 2019-05-31 DIAGNOSIS — I2510 Atherosclerotic heart disease of native coronary artery without angina pectoris: Secondary | ICD-10-CM | POA: Diagnosis not present

## 2019-05-31 DIAGNOSIS — I5022 Chronic systolic (congestive) heart failure: Secondary | ICD-10-CM

## 2019-05-31 DIAGNOSIS — E7849 Other hyperlipidemia: Secondary | ICD-10-CM | POA: Diagnosis not present

## 2019-05-31 DIAGNOSIS — Z95810 Presence of automatic (implantable) cardiac defibrillator: Secondary | ICD-10-CM | POA: Diagnosis not present

## 2019-05-31 DIAGNOSIS — R42 Dizziness and giddiness: Secondary | ICD-10-CM | POA: Diagnosis not present

## 2019-05-31 DIAGNOSIS — Z7189 Other specified counseling: Secondary | ICD-10-CM | POA: Diagnosis not present

## 2019-05-31 DIAGNOSIS — Z79899 Other long term (current) drug therapy: Secondary | ICD-10-CM | POA: Diagnosis not present

## 2019-05-31 DIAGNOSIS — Z7901 Long term (current) use of anticoagulants: Secondary | ICD-10-CM | POA: Diagnosis not present

## 2019-05-31 DIAGNOSIS — I472 Ventricular tachycardia, unspecified: Secondary | ICD-10-CM

## 2019-05-31 DIAGNOSIS — I10 Essential (primary) hypertension: Secondary | ICD-10-CM

## 2019-05-31 DIAGNOSIS — I480 Paroxysmal atrial fibrillation: Secondary | ICD-10-CM

## 2019-05-31 NOTE — Patient Instructions (Signed)
Medication Instructions:  Your physician recommends that you continue on your current medications as directed. Please refer to the Current Medication list given to you today. *If you need a refill on your cardiac medications before your next appointment, please call your pharmacy*   Lab Work: None If you have labs (blood work) drawn today and your tests are completely normal, you will receive your results only by: [name] MyChart Message (if you have MyChart) OR . A paper copy in the mail If you have any lab test that is abnormal or we need to change your treatment, we will call you to review the results.   Testing/Procedures: Your physician has requested that you have a carotid duplex. This test is an ultrasound of the carotid arteries in your neck. It looks at blood flow through these arteries that supply the brain with blood. Allow one hour for this exam. There are no restrictions or special instructions.    Follow-Up: At [facility], you and your health needs are our priority.  As part of our continuing mission to provide you with exceptional heart care, we have created designated Provider Care Teams.  These Care Teams include your primary Cardiologist (physician) and Advanced Practice Providers (APPs -  Physician Assistants and Nurse Practitioners) who all work together to provide you with the care you need, when you need it.  We recommend signing up for the patient portal called "MyChart".  Sign up information is provided on this After Visit Summary.  MyChart is used to connect with patients for Virtual Visits (Telemedicine).  Patients are able to view lab/test results, encounter notes, upcoming appointments, etc.  Non-urgent messages can be sent to your provider as well.   To learn more about what you can do with MyChart, go to [email or web address].    Your next appointment:   6 month(s)  The format for your next appointment:   In Person  Provider:   Truitt Merle, NP, Cecilie Kicks, NP or Kathyrn Drown, NP    Other Instructions

## 2019-06-01 ENCOUNTER — Other Ambulatory Visit: Payer: Self-pay

## 2019-06-01 DIAGNOSIS — I472 Ventricular tachycardia, unspecified: Secondary | ICD-10-CM

## 2019-06-01 MED ORDER — AMIODARONE HCL 200 MG PO TABS: 200.0000 mg | ORAL_TABLET | Freq: Every day | ORAL | 1 refills | Status: DC

## 2019-06-06 DIAGNOSIS — L821 Other seborrheic keratosis: Secondary | ICD-10-CM | POA: Diagnosis not present

## 2019-06-06 DIAGNOSIS — Z85828 Personal history of other malignant neoplasm of skin: Secondary | ICD-10-CM | POA: Diagnosis not present

## 2019-06-06 DIAGNOSIS — L820 Inflamed seborrheic keratosis: Secondary | ICD-10-CM | POA: Diagnosis not present

## 2019-06-07 ENCOUNTER — Ambulatory Visit (HOSPITAL_COMMUNITY)
Admission: RE | Admit: 2019-06-07 | Discharge: 2019-06-07 | Disposition: A | Discharge status: Home / Self Care | Payer: Medicare Other | Source: Ambulatory Visit | Attending: Cardiovascular Disease | Admitting: Cardiovascular Disease

## 2019-06-07 ENCOUNTER — Other Ambulatory Visit: Payer: Self-pay

## 2019-06-07 DIAGNOSIS — R42 Dizziness and giddiness: Secondary | ICD-10-CM | POA: Insufficient documentation

## 2019-06-20 ENCOUNTER — Other Ambulatory Visit: Payer: Self-pay | Admitting: Nurse Practitioner

## 2019-06-20 DIAGNOSIS — I5022 Chronic systolic (congestive) heart failure: Secondary | ICD-10-CM

## 2019-06-20 DIAGNOSIS — I25810 Atherosclerosis of coronary artery bypass graft(s) without angina pectoris: Secondary | ICD-10-CM

## 2019-06-29 MED ORDER — APIXABAN 5 MG PO TABS: 5.0000 mg | ORAL_TABLET | Freq: Two times a day (BID) | ORAL | 11 refills | Status: DC

## 2019-07-13 ENCOUNTER — Ambulatory Visit (INDEPENDENT_AMBULATORY_CARE_PROVIDER_SITE_OTHER): Payer: Medicare Other | Admitting: *Deleted

## 2019-07-13 DIAGNOSIS — I5022 Chronic systolic (congestive) heart failure: Secondary | ICD-10-CM | POA: Diagnosis not present

## 2019-07-13 LAB — CUP PACEART REMOTE DEVICE CHECK
Battery Remaining Longevity: 30 mo
Battery Remaining Percentage: 35 %
Brady Statistic RA Percent Paced: 2 %
Brady Statistic RV Percent Paced: 0 %
Date Time Interrogation Session: 20210415040500
HighPow Impedance: 58 Ohm
Implantable Lead Implant Date: 20110610
Implantable Lead Implant Date: 20110610
Implantable Lead Location: 753859
Implantable Lead Location: 753860
Implantable Lead Model: 185
Implantable Lead Model: 4135
Implantable Lead Serial Number: 28681386
Implantable Lead Serial Number: 339643
Implantable Pulse Generator Implant Date: 20110610
Lead Channel Impedance Value: 450 Ohm
Lead Channel Impedance Value: 646 Ohm
Lead Channel Pacing Threshold Amplitude: 0.9 V
Lead Channel Pacing Threshold Amplitude: 1.1 V
Lead Channel Pacing Threshold Pulse Width: 0.4 ms
Lead Channel Pacing Threshold Pulse Width: 0.4 ms
Lead Channel Setting Pacing Amplitude: 2 V
Lead Channel Setting Pacing Amplitude: 2.2 V
Lead Channel Setting Pacing Pulse Width: 0.4 ms
Lead Channel Setting Sensing Sensitivity: 0.6 mV
Pulse Gen Serial Number: 164892

## 2019-07-14 ENCOUNTER — Other Ambulatory Visit: Payer: Self-pay | Admitting: Pulmonary Disease

## 2019-07-14 NOTE — Progress Notes (Signed)
ICD Remote  

## 2019-07-17 ENCOUNTER — Encounter (HOSPITAL_BASED_OUTPATIENT_CLINIC_OR_DEPARTMENT_OTHER): Payer: Self-pay | Admitting: Emergency Medicine

## 2019-07-17 ENCOUNTER — Emergency Department (HOSPITAL_BASED_OUTPATIENT_CLINIC_OR_DEPARTMENT_OTHER)
Admission: EM | Admit: 2019-07-17 | Discharge: 2019-07-17 | Disposition: A | Discharge status: Home / Self Care | Payer: Medicare Other | Attending: Emergency Medicine | Admitting: Emergency Medicine

## 2019-07-17 ENCOUNTER — Other Ambulatory Visit: Payer: Self-pay

## 2019-07-17 DIAGNOSIS — Z7982 Long term (current) use of aspirin: Secondary | ICD-10-CM | POA: Insufficient documentation

## 2019-07-17 DIAGNOSIS — I5022 Chronic systolic (congestive) heart failure: Secondary | ICD-10-CM | POA: Diagnosis not present

## 2019-07-17 DIAGNOSIS — Z79899 Other long term (current) drug therapy: Secondary | ICD-10-CM | POA: Diagnosis not present

## 2019-07-17 DIAGNOSIS — N183 Chronic kidney disease, stage 3 unspecified: Secondary | ICD-10-CM | POA: Insufficient documentation

## 2019-07-17 DIAGNOSIS — R1032 Left lower quadrant pain: Secondary | ICD-10-CM | POA: Diagnosis not present

## 2019-07-17 DIAGNOSIS — J449 Chronic obstructive pulmonary disease, unspecified: Secondary | ICD-10-CM | POA: Insufficient documentation

## 2019-07-17 DIAGNOSIS — K4090 Unilateral inguinal hernia, without obstruction or gangrene, not specified as recurrent: Secondary | ICD-10-CM | POA: Diagnosis not present

## 2019-07-17 DIAGNOSIS — F17210 Nicotine dependence, cigarettes, uncomplicated: Secondary | ICD-10-CM | POA: Insufficient documentation

## 2019-07-17 DIAGNOSIS — I130 Hypertensive heart and chronic kidney disease with heart failure and stage 1 through stage 4 chronic kidney disease, or unspecified chronic kidney disease: Secondary | ICD-10-CM | POA: Diagnosis not present

## 2019-07-17 NOTE — Discharge Instructions (Signed)
Try to reduce the hernia if you are having pain.  Lie flat and apply gentle direct pressure.  If you cannot reduce it or if the pain is much more severe or if you can keep anything down or have no output from your bottom then please return to the emergency [place] for evaluation.  Please follow-up with a surgeon and let them know that your pain is gotten much worse and that you have a new area of bulging.

## 2019-07-17 NOTE — ED Notes (Signed)
Pt. Was evaluated by EDP. Upon assessment Pt. Was given instructions on his hernia care.  Pt. Verbalizes understanding of discharge instructions given with no  Questions.

## 2019-07-17 NOTE — ED Triage Notes (Signed)
Pt states he had a pain in his inguinal hernia earlier but it has subsided.

## 2019-07-17 NOTE — ED Provider Notes (Signed)
[facility] EMERGENCY DEPARTMENT Provider Note   CSN: [id number] Arrival date & time: 07/17/19  1604     History Chief Complaint  Patient presents with  . Inguinal Hernia    Steven Guzman is a 79 y.o. male.  79 yo M with a cc of pain from his left hernia.  Patient states that he has a issue with this and its been off and on but has been worsening over the past couple days.  Felt the pain was really severe earlier today but resolved prior to arrival here.  Describes it as sharp and shooting.  Feels like he has a third testicle.  Prior to that it was coming directly out from the groin.  He thinks it is now moved.  Denies trauma.  Has a chronic cough which he thinks may have caused due to COPD and continued smoking.  He had seen a surgeon in the past and was deemed to be a poor candidate for hernia repair.  Has a truss and has been wearing it.  The history is provided by the patient.  Illness Severity:  Moderate Onset quality:  Gradual Duration:  2 days Timing:  Constant Progression:  Worsening Chronicity:  New Associated symptoms: no abdominal pain, no chest pain, no congestion, no diarrhea, no fever, no headaches, no myalgias, no rash, no shortness of breath and no vomiting        Past Medical History:  Diagnosis Date  . AICD (automatic cardioverter/defibrillator) present   . Aneurysm ([name])    a. Aneurysmal infrarenal aorta up to 33 mm on CT 10/2014, recommended f/u due 10/2017  . Anginal pain ([place])   . Anxiety   . Basal cell carcinoma of nose    S/P MOHS  . Biliary acute pancreatitis   . CAD (coronary artery disease)    a. s/p MI in 1994 with PCI to LAD at that time b. cath 10/2012 demonstrated EF 30%, inferior akinesis with mild hypokinesis of all walls, patent LAD and RCA stents; ostial PDA with 80-90% obstruction with medical therapy recommended   . Chronic systolic CHF (congestive heart failure) (HCC)    EF 30 to 35 % as of 09/2014.   [name] CKD (chronic kidney  disease), stage III   . Complication of anesthesia 10/2014   "had to have defibrillator w/ERCP"  . COPD (chronic obstructive pulmonary disease) ([place])    a. followed by pulmonary, COPD GOLD stage II  . Depression   . Diverticulosis of colon 07/2014   noted on CT  . GERD (gastroesophageal reflux disease)   . Hiatal hernia   . Hyperglycemia 10/2012.  [name] Hyperlipidemia   . Hypertension   . Myocardial infarction [facility]) 1994; 2011  . Pneumonia 1946; 2015  . Prostate enlargement 07/2014   observed on CT  . Tobacco abuse   . Ventricular tachycardia ([place])    a. 08/2009 s/p BSX E110 Teligen 100 AICD, ser#: [id number];  b. 08/2008 VT req ATP - detection reprogrammed from 160 to 150. c. EPS and VT ablation by Dr. Lovena Le 12/21/2014    Patient Active Problem List   Diagnosis Date Noted  . Paraseptal emphysema ([place]) 12/15/2018  . Tobacco abuse 12/15/2018  . Perianal abscess 11/15/2018  . Left inguinal hernia 11/15/2018  . Leg wound, right 11/15/2018  . Weight loss 09/29/2018  . Dark stools 09/29/2018  . Lumbar trigger point syndrome 04/20/2018  . Throat and mouth symptom 02/03/2018  . Gastroesophageal reflux disease without esophagitis   .  Esophageal stricture   . Gastritis and gastroduodenitis   . Degenerative cervical spinal stenosis 10/27/2017  . Carotid bruit 10/27/2017  . B12 deficiency 08/25/2017  . Anemia 06/10/2017  . Right ankle pain 04/09/2017  . BPH (benign prostatic hyperplasia) 12/31/2016  . Dizzinesses 05/22/2016  . Pancreatitis, chronic ([place]) 08/02/2015  . Mass of throat 06/30/2015  . Depression 05/24/2015  . Atrial fibrillation ([place]) 03/15/2015  . Routine general medical examination at a health care facility 05/12/2014  . Hemoptysis 03/16/2014  . COPD GOLD GRADE C 12/25/2013  . Chronic systolic heart failure ([place]) 07/07/2013  . CAD (coronary artery disease) 11/23/2012  . Smokers' cough ([place]) 11/23/2012  . Ventricular tachycardia ([place]) 11/23/2012  . Essential hypertension  11/23/2012  . Hyperlipidemia 11/23/2012  . ICD (implantable cardioverter-defibrillator) in place 11/23/2012    Past Surgical History:  Procedure Laterality Date  . BIOPSY  12/21/2017   Procedure: BIOPSY;  Surgeon: [name], MD;  Location: [facility];  Service: Endoscopy;;  . CATARACT EXTRACTION W/ INTRAOCULAR LENS  IMPLANT, BILATERAL Bilateral ~ 2011  . COLONOSCOPY    . COLONOSCOPY WITH PROPOFOL N/A 12/21/2017   Procedure: COLONOSCOPY WITH PROPOFOL;  Surgeon: [name], MD;  Location: [facility];  Service: Endoscopy;  Laterality: N/A;  . ELECTROPHYSIOLOGIC STUDY N/A 12/21/2014   Procedure: V Tach Ablation;  Surgeon: [name], MD;  Location: [facility];  Service: Cardiovascular;  Laterality: N/A;  . ERCP N/A 11/16/2014   Procedure: ENDOSCOPIC RETROGRADE CHOLANGIOPANCREATOGRAPHY (ERCP);  Surgeon: [name], MD;  Location: [facility];  Service: Endoscopy;  Laterality: N/A;  . ESOPHAGOGASTRODUODENOSCOPY (EGD) WITH PROPOFOL N/A 12/21/2017   Procedure: ESOPHAGOGASTRODUODENOSCOPY (EGD) WITH PROPOFOL;  Surgeon: [name], MD;  Location: [facility];  Service: Endoscopy;  Laterality: N/A;  . EYE SURGERY    . FOOT SURGERY Left 2005   "fixed bone that stuck out in my ankle area"  . HEMORRHOID BANDING    . IMPLANTABLE CARDIOVERTER DEFIBRILLATOR IMPLANT  09/06/09   BSX dual chamber ICD implanted in [place] for cardiac arrest 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  . INGUINAL HERNIA REPAIR Right ~ 1995  . LEFT HEART CATHETERIZATION WITH CORONARY ANGIOGRAM N/A 11/25/2012   demonstrated EF 30%, inferior akinesis with mild hypokinesis of all walls, patent LAD and RCA stents; ostial PDA with 80-90% obstruction with medical therapy recommended  . MOHS SURGERY  2008   nose, skin graft  . POLYPECTOMY  12/21/2017   Procedure: POLYPECTOMY;  Surgeon: [name], MD;  Location: [facility];  Service: Endoscopy;;  . RETINAL DETACHMENT SURGERY Right 2013  . TENOLYSIS Right 12/21/2013    Procedure: TENOLYSIS FLEXOR CARPI RADIALIS ,DEBRIDEMENT RIGHT JOINT WRIST,DEBRIDEMENT SCAPHOTRAPEZIAL TRAPEZOID, REPAIR OF EXTENSOR HOOD;  Surgeon: Daryll Brod, MD;  Location: [facility];  Service: Orthopedics;  Laterality: Right;  . V-TACH ABLATION  12/21/2014  . VIDEO BRONCHOSCOPY Bilateral 01/09/2016   Procedure: VIDEO BRONCHOSCOPY WITHOUT FLUORO;  Surgeon: Juanito Doom, MD;  Location: [facility];  Service: Cardiopulmonary;  Laterality: Bilateral;       Family History  Problem Relation Age of Onset  . Heart attack Brother   . CAD Father   . Hypertension Father   . CAD Mother   . Hypertension Mother   . Hypertension Brother   . Stroke Neg Hx     Social History   Tobacco Use  . Smoking status: Current Every Day Smoker    Packs/day: 1.00    Years: 55.00  Pack years: 55.00    Types: Cigarettes  . Smokeless tobacco: Never Used  . Tobacco comment: always ready to quit but does not work out  Substance Use Topics  . Alcohol use: Yes    Alcohol/week: 0.0 standard drinks    Comment: occ  . Drug use: No    Home Medications Prior to Admission medications   Medication Sig Start Date End Date Taking? Authorizing Provider  carvedilol (COREG) 3.125 MG tablet TAKE 1 TABLET(3.125 MG) BY MOUTH TWICE DAILY 06/20/19   [name], NP  albuterol (PROVENTIL) (2.5 MG/3ML) 0.083% nebulizer solution USE 1 VIAL VIA NEBULIZER EVERY 6 HOURS AS NEEDED FOR WHEEZING OR SHORTNESS OF BREATH 07/17/19   Olalere, Cicero Duck A, MD  amiodarone (PACERONE) 200 MG tablet Take 1 tablet (200 mg total) by mouth daily. 06/01/19   Belva Crome, MD  apixaban (ELIQUIS) 5 MG TABS tablet Take 1 tablet (5 mg total) by mouth 2 (two) times daily. 06/29/19   [name], MD  Artificial Tear Solution (SOOTHE XP OP) Place 2 drops into both eyes daily as needed (dry eyes).    [provider]  aspirin EC 81 MG tablet Take 1 tablet (81 mg total) by mouth every Monday, Wednesday, and Friday.  04/25/18   Belva Crome, MD  atorvastatin (LIPITOR) 80 MG tablet TAKE 1 TABLET(80 MG) BY MOUTH DAILY 10/21/18   Burtis Junes, NP  benazepril (LOTENSIN) 10 MG tablet Take 5 mg by mouth daily.    [provider]  budesonide-formoterol (SYMBICORT) 160-4.5 MCG/ACT inhaler Inhale 2 puffs into the lungs 2 (two) times daily. 03/27/19   Laurin Coder, MD  buPROPion (WELLBUTRIN) 75 MG tablet TAKE 1 TABLET(75 MG) BY MOUTH DAILY 04/04/19   [name], NP  busPIRone (BUSPAR) 15 MG tablet TAKE 1 TABLET(15 MG) BY MOUTH THREE TIMES DAILY 05/24/19   [name], NP  Choline Fenofibrate (FENOFIBRIC ACID) 135 MG CPDR TAKE 1 CAPSULE BY MOUTH DAILY 05/25/19   Belva Crome, MD  escitalopram (LEXAPRO) 20 MG tablet TAKE 1 TABLET(20 MG) BY MOUTH DAILY 02/13/19   Hoyt Koch, MD  fluticasone [facility]) 50 MCG/ACT nasal spray INSTILL 2 SPRAYS INTO EACH NOSTRIL QD 04/14/18   [provider]  furosemide (LASIX) 40 MG tablet TAKE 1/2 TABLET(20 MG) BY MOUTH DAILY 03/06/19   Belva Crome, MD  Ipratropium-Albuterol (COMBIVENT RESPIMAT) 20-100 MCG/ACT AERS respimat Inhale 1 puff into the lungs every 6 (six) hours. Shortness of breath or wheezing 03/27/19   Sherrilyn Rist A, MD  IRON PO Take by mouth daily.     [provider]  MAGNESIUM-OXIDE 400 (241.3 Mg) MG tablet TAKE 1 TABSULE BY MOUTH DAILY 11/24/18   Hoyt Koch, MD  mexiletine (MEXITIL) 200 MG capsule TAKE 1 CAPSULE(200 MG) BY MOUTH TWICE DAILY 05/05/19   [name], MD  Multiple Vitamins-Minerals (CENTRUM ADULTS PO) Take by mouth daily.    [provider]  nitroGLYCERIN (NITROSTAT) 0.4 MG SL tablet DISSOLVE 1 TABLET UNDER THE TONGUE EVERY 5 MINUTES FOR 3 DOSES AS NEEDED FOR CHEST PAIN 09/26/18   [name], MD  omeprazole (PRILOSEC) 20 MG capsule Take 20 mg by mouth daily.     [provider]  potassium chloride SA (KLOR-CON) 20 MEQ tablet Take one tablet by mouth once daily  05/02/19   [name], NP  Respiratory Therapy Supplies (FLUTTER) DEVI Use as directed 03/16/17   Juanito Doom, MD  senna (  SENOKOT) 8.6 MG tablet Take 1-2 tablets by mouth daily.    [provider]  SPIRIVA RESPIMAT 2.5 MCG/ACT AERS INHALE 2 PUFFS INTO THE LUNGS DAILY 05/05/19   Sherrilyn Rist A, MD  tamsulosin (FLOMAX) 0.4 MG CAPS capsule TAKE 1 CAPSULE(0.4 MG) BY MOUTH DAILY AFTER SUPPER 05/29/19   [name], NP  vitamin B-12 (CYANOCOBALAMIN) 1000 MCG tablet Take 1 tablet (1,000 mcg total) by mouth daily. 02/27/19   [name], NP    Allergies    Sulfa antibiotics  Review of Systems   Review of Systems  Constitutional: Negative for chills and fever.  HENT: Negative for congestion and facial swelling.   Eyes: Negative for discharge and visual disturbance.  Respiratory: Negative for shortness of breath.   Cardiovascular: Negative for chest pain and palpitations.  Gastrointestinal: Negative for abdominal pain, diarrhea and vomiting.  Genitourinary: Positive for scrotal swelling.  Musculoskeletal: Negative for arthralgias and myalgias.  Skin: Negative for color change and rash.  Neurological: Negative for tremors, syncope and headaches.  Psychiatric/Behavioral: Negative for confusion and dysphoric mood.    Physical Exam Updated Vital Signs BP 121/77 (BP Location: Left Arm)   Pulse 64   Temp 97.8 F (36.6 C) (Oral)   Resp 20   Ht 6' (1.829 m)   Wt 85.7 kg   SpO2 93%   BMI 25.63 kg/m   Physical Exam Vitals and nursing note reviewed.  Constitutional:      Appearance: He is well-developed.  HENT:     Head: Normocephalic and atraumatic.  Eyes:     Pupils: Pupils are equal, round, and reactive to light.  Neck:     Vascular: No JVD.  Cardiovascular:     Rate and Rhythm: Normal rate and regular rhythm.     Heart sounds: No murmur. No friction rub. No gallop.   Pulmonary:     Effort: No respiratory distress.     Breath sounds: No  wheezing.  Abdominal:     General: There is no distension.     Tenderness: There is no guarding or rebound.  Genitourinary:    Comments: Increased area of fatty tissue at the groin overlying the pubic symphysis.  Difficult to appreciate one-sided swelling worse than the other.  Left indirect inguinal hernia.  No erythema mildly tender. Musculoskeletal:        General: Normal range of motion.     Cervical back: Normal range of motion and neck supple.  Skin:    Coloration: Skin is not pale.     Findings: No rash.  Neurological:     Mental Status: He is alert and oriented to person, place, and time.  Psychiatric:        Behavior: Behavior normal.     ED Results / Procedures / Treatments   Labs (all labs ordered are listed, but only abnormal results are displayed) Labs Reviewed - No data to display  EKG None  Radiology No results found.  Procedures Hernia reduction  Date/Time: 07/17/2019 4:43 PM Performed by: Deno Etienne, DO Authorized by: Deno Etienne, DO  Consent: Verbal consent obtained. Risks and benefits: risks, benefits and alternatives were discussed Consent given by: patient Patient understanding: patient states understanding of the procedure being performed Patient consent: the patient's understanding of the procedure matches consent given Site marked: the operative site was not marked Imaging studies: imaging studies not available Required items: required blood products, implants, devices, and special equipment available Patient identity confirmed: verbally with patient Time out:  Immediately prior to procedure a "time out" was called to verify the correct patient, procedure, equipment, support staff and site/side marked as required. Local anesthesia used: no  Anesthesia: Local anesthesia used: no  Sedation: Patient sedated: no  Patient tolerance: patient tolerated the procedure well with no immediate complications    (including critical care  time)  Medications Ordered in ED Medications - No data to display  ED Course  I have reviewed the triage vital signs and the nursing notes.  Pertinent labs & imaging results that were available during my care of the patient were reviewed by me and considered in my medical decision making (see chart for details).    MDM Rules/Calculators/A&P                      79 yo M with a chief complaints of pain from a left inguinal hernia.  This is a chronic problem for him.  He actually feels that its now bulging in a different area.  I wonder if he now has a direct as well as indirect inguinal hernia on that side.  I was able to reduce the indirect inguinal hernia without issue.  Unable to fully appreciate the direct inguinal hernia as it is very similar to the other side.  Does not appear to be significantly tender.  Patient is feeling much better post reduction.  We will have him follow-up with his general surgeon in the office.  4:45 PM:  I have discussed the diagnosis/risks/treatment options with the patient and family and believe the pt to be eligible for discharge home to follow-up with Gen surgery. We also discussed returning to the ED immediately if new or worsening sx occur. We discussed the sx which are most concerning (e.g., sudden worsening pain, fever, inability to tolerate by mouth) that necessitate immediate return. Medications administered to the patient during their visit and any new prescriptions provided to the patient are listed below.  Medications given during this visit Medications - No data to display   The patient appears reasonably screen and/or stabilized for discharge and I doubt any other medical condition or other [facility] requiring further screening, evaluation, or treatment in the ED at this time prior to discharge.   Final Clinical Impression(s) / ED Diagnoses Final diagnoses:  Indirect inguinal hernia    Rx / DC Orders ED Discharge Orders    None       Deno Etienne, DO 07/17/19 1645

## 2019-07-18 ENCOUNTER — Telehealth: Payer: Self-pay | Admitting: Pulmonary Disease

## 2019-07-18 MED ORDER — ALBUTEROL SULFATE (2.5 MG/3ML) 0.083% IN NEBU: INHALATION_SOLUTION | RESPIRATORY_TRACT | 0 refills | Status: AC

## 2019-07-18 NOTE — Telephone Encounter (Signed)
ATC pt, no answer. Left message for pt to call back. Rx sent   Pt needs to schedule appt for follow up       Patient Instructions by Laurin Coder, MD at 12/16/2018 1:30 PM Author: Laurin Coder, MD Author Type: Physician Filed: 12/16/2018 2:08 PM  Note Status: Signed Cosign: Cosign Not Required Encounter Date: 12/16/2018  Editor: Laurin Coder, MD (Physician)    Hemoptysis Very transient, resolved at present  No indication to take a look in your breathing tube at present  Call if any recurrence and we can plan for a bronchoscopy as needed  Continue your current inhalers  I will see you back in the office in about 3 months

## 2019-07-19 NOTE — Telephone Encounter (Signed)
LMTCB x2 for pt 

## 2019-07-21 NOTE — Telephone Encounter (Signed)
LMTCB x3 for pt. We have attempted to contact pt several times with no success or call back from pt. Per triage protocol, message will be closed.   

## 2019-08-05 ENCOUNTER — Encounter: Payer: Self-pay | Admitting: Internal Medicine

## 2019-08-05 ENCOUNTER — Encounter: Payer: Self-pay | Admitting: Nurse Practitioner

## 2019-08-08 ENCOUNTER — Other Ambulatory Visit: Payer: Self-pay

## 2019-08-08 DIAGNOSIS — L821 Other seborrheic keratosis: Secondary | ICD-10-CM | POA: Diagnosis not present

## 2019-08-08 DIAGNOSIS — L570 Actinic keratosis: Secondary | ICD-10-CM | POA: Diagnosis not present

## 2019-08-08 DIAGNOSIS — D692 Other nonthrombocytopenic purpura: Secondary | ICD-10-CM | POA: Insufficient documentation

## 2019-08-08 MED ORDER — POTASSIUM CHLORIDE CRYS ER 20 MEQ PO TBCR: EXTENDED_RELEASE_TABLET | ORAL | 0 refills | Status: DC

## 2019-08-08 MED ORDER — ESCITALOPRAM OXALATE 20 MG PO TABS: ORAL_TABLET | ORAL | 1 refills | Status: DC

## 2019-08-09 ENCOUNTER — Other Ambulatory Visit: Payer: Self-pay

## 2019-08-09 MED ORDER — ATORVASTATIN CALCIUM 80 MG PO TABS: ORAL_TABLET | ORAL | 3 refills | Status: DC

## 2019-08-14 DIAGNOSIS — K4090 Unilateral inguinal hernia, without obstruction or gangrene, not specified as recurrent: Secondary | ICD-10-CM | POA: Diagnosis not present

## 2019-08-25 ENCOUNTER — Telehealth: Payer: Self-pay

## 2019-08-25 NOTE — Telephone Encounter (Signed)
I called and spoke with patient about a refill request that had been faxed to office by [facility] for Gabapentin which is no longer on his medication list he said that he didn't need the medication that it didn't help his back pain and he would discuss this at his up coming appointment with Janett Billow

## 2019-08-29 DIAGNOSIS — H43393 Other vitreous opacities, bilateral: Secondary | ICD-10-CM | POA: Diagnosis not present

## 2019-08-29 DIAGNOSIS — H04123 Dry eye syndrome of bilateral lacrimal glands: Secondary | ICD-10-CM | POA: Diagnosis not present

## 2019-08-29 DIAGNOSIS — H02834 Dermatochalasis of left upper eyelid: Secondary | ICD-10-CM | POA: Diagnosis not present

## 2019-08-29 DIAGNOSIS — H02831 Dermatochalasis of right upper eyelid: Secondary | ICD-10-CM | POA: Diagnosis not present

## 2019-08-29 DIAGNOSIS — H18413 Arcus senilis, bilateral: Secondary | ICD-10-CM | POA: Diagnosis not present

## 2019-08-29 DIAGNOSIS — H35033 Hypertensive retinopathy, bilateral: Secondary | ICD-10-CM | POA: Diagnosis not present

## 2019-08-30 ENCOUNTER — Ambulatory Visit: Payer: Medicare Other | Admitting: Nurse Practitioner

## 2019-09-04 ENCOUNTER — Other Ambulatory Visit: Payer: Self-pay | Admitting: Pulmonary Disease

## 2019-09-04 ENCOUNTER — Other Ambulatory Visit: Payer: Self-pay

## 2019-09-04 DIAGNOSIS — J432 Centrilobular emphysema: Secondary | ICD-10-CM

## 2019-09-04 MED ORDER — TAMSULOSIN HCL 0.4 MG PO CAPS: ORAL_CAPSULE | ORAL | 0 refills | Status: DC

## 2019-09-04 NOTE — Telephone Encounter (Signed)
Received refill request from pharmacyfor Tamsulosin 0.4 mg, but contraindication came up when I tried to refill medication.

## 2019-09-08 ENCOUNTER — Other Ambulatory Visit: Payer: Self-pay | Admitting: *Deleted

## 2019-09-11 ENCOUNTER — Ambulatory Visit
Admission: RE | Admit: 2019-09-11 | Discharge: 2019-09-11 | Disposition: A | Discharge status: Home / Self Care | Payer: Medicare Other | Source: Ambulatory Visit | Attending: Family | Admitting: Family

## 2019-09-11 ENCOUNTER — Encounter: Payer: Self-pay | Admitting: Family

## 2019-09-11 ENCOUNTER — Other Ambulatory Visit: Payer: Self-pay

## 2019-09-11 ENCOUNTER — Ambulatory Visit (INDEPENDENT_AMBULATORY_CARE_PROVIDER_SITE_OTHER): Payer: Medicare Other | Admitting: Family

## 2019-09-11 VITALS — BP 130/88 | HR 66 | Temp 97.7°F | Resp 16 | Ht 72.0 in | Wt 190.6 lb

## 2019-09-11 DIAGNOSIS — J441 Chronic obstructive pulmonary disease with (acute) exacerbation: Secondary | ICD-10-CM | POA: Diagnosis not present

## 2019-09-11 DIAGNOSIS — R0602 Shortness of breath: Secondary | ICD-10-CM | POA: Diagnosis not present

## 2019-09-11 DIAGNOSIS — I25810 Atherosclerosis of coronary artery bypass graft(s) without angina pectoris: Secondary | ICD-10-CM | POA: Diagnosis not present

## 2019-09-11 MED ORDER — PREDNISONE 10 MG PO TABS: ORAL_TABLET | ORAL | 0 refills | Status: DC

## 2019-09-11 NOTE — Patient Instructions (Addendum)
-   Please get chest X-ray at [facility] [address] at [place]. - Prednisone 10 mg tablet take 4 tablets ( 40 mg ) by mouth x 1 day then  Take 3 Tablet ( 30 mg ) by mouth x 1 day then  Take 2 tablet ( 20 mg ) by mouth x 1 day then  Take 1 tablet ( 10 mg ) by mouth and stop.   - Notify provider or go to ED  if symptoms worse or fail to improve

## 2019-09-11 NOTE — Progress Notes (Signed)
Provider: Mirel Hundal FNP-C  [name], NP  Patient Care Team: [name], NP as PCP - General (Geriatric Medicine) Belva Crome, MD as PCP - Cardiology (Cardiology) [name], MD as PCP - Electrophysiology (Cardiology) Roel Cluck, MD as Referring Physician (Ophthalmology) Harriett Sine, MD as Consulting Physician (Dermatology)  Extended Emergency Contact Information Primary Emergency Contact: Baldo,[name] Address: [place], [place] [address] [place] Phone: [phone] Mobile Phone: [phone] Relation: Spouse Secondary Emergency Contact: [name], [place] [address] [name] of [place] Phone: [phone] Mobile Phone: [phone] Relation: Relative  Code Status:  DNR Goals of care: Advanced Directive information Advanced Directives 09/11/2019  Does Patient Have a Medical Advance Directive? No  Does patient want to make changes to medical advance directive? -  Would patient like information on creating a medical advance directive? No - Patient declined     Chief Complaint  Patient presents with  . Acute Visit    Shortness of Breath, Headache, Chest Congestion, and Balance Issues/Dizziness if turning too fast;    HPI:  Pt is a 79 y.o. male seen today for an acute visit for evaluation of shortness of breath and weakness on the legs X 3 days.shortness of breath worst with walking.He does have a significant medical history of COPD and chronic systolic congestive heart failure.Had some trembling feeling on epigastric areaon Saturday 09/09/2019 but went away.Legs also felt weak.He woke up this morning with sweating his vest was soaked.Had loose stool on Saturday and Sunday but had taken epsom laxative.Loose stool has resolved.He denies any fever,chills and cough has not worsen.He is concerns since some of the symptoms are similar to COVID-19 infection  though he denies any loss of smell or taste.  Also complains of balance issues.walks with a cane. No fall episode.Has had no weight gain.Appetite is okay.   Past Medical History:  Diagnosis Date  . AICD (automatic cardioverter/defibrillator) present   . Aneurysm ([place])    a. Aneurysmal infrarenal aorta up to 33 mm on CT 10/2014, recommended f/u due 10/2017  . Anginal pain ([place])   . Anxiety   . Basal cell carcinoma of nose    S/P MOHS  . Biliary acute pancreatitis   . CAD (coronary artery disease)    a. s/p MI in 1994 with PCI to LAD at that time b. cath 10/2012 demonstrated EF 30%, inferior akinesis with mild hypokinesis of all walls, patent LAD and RCA stents; ostial PDA with 80-90% obstruction with medical therapy recommended   . Chronic systolic CHF (congestive heart failure) (HCC)    EF 30 to 35 % as of 09/2014.   [name] CKD (chronic kidney disease), stage III   . Complication of anesthesia 10/2014   "had to have defibrillator w/ERCP"  . COPD (chronic obstructive pulmonary disease) ([place])    a. followed by pulmonary, COPD GOLD stage II  . Depression   . Diverticulosis of colon 07/2014   noted on CT  . GERD (gastroesophageal reflux disease)   . Hiatal hernia   . Hyperglycemia 10/2012.  [name] Hyperlipidemia   . Hypertension   . Myocardial infarction [facility]) 1994; 2011  . Pneumonia 1946; 2015  . Prostate enlargement 07/2014   observed on CT  . Tobacco abuse   . Ventricular tachycardia ([place])  a. 08/2009 s/p BSX E110 Teligen 100 AICD, ser#: [id number];  b. 08/2008 VT req ATP - detection reprogrammed from 160 to 150. c. EPS and VT ablation by Dr. Lovena Le 12/21/2014   Past Surgical History:  Procedure Laterality Date  . BIOPSY  12/21/2017   Procedure: BIOPSY;  Surgeon: [name], MD;  Location: [facility];  Service: Endoscopy;;  . CATARACT EXTRACTION W/ INTRAOCULAR LENS  IMPLANT, BILATERAL Bilateral ~ 2011  . COLONOSCOPY    . COLONOSCOPY WITH PROPOFOL N/A 12/21/2017   Procedure: COLONOSCOPY WITH  PROPOFOL;  Surgeon: [name], MD;  Location: [facility];  Service: Endoscopy;  Laterality: N/A;  . ELECTROPHYSIOLOGIC STUDY N/A 12/21/2014   Procedure: V Tach Ablation;  Surgeon: [name], MD;  Location: [facility];  Service: Cardiovascular;  Laterality: N/A;  . ERCP N/A 11/16/2014   Procedure: ENDOSCOPIC RETROGRADE CHOLANGIOPANCREATOGRAPHY (ERCP);  Surgeon: [name], MD;  Location: [facility];  Service: Endoscopy;  Laterality: N/A;  . ESOPHAGOGASTRODUODENOSCOPY (EGD) WITH PROPOFOL N/A 12/21/2017   Procedure: ESOPHAGOGASTRODUODENOSCOPY (EGD) WITH PROPOFOL;  Surgeon: [name], MD;  Location: [facility];  Service: Endoscopy;  Laterality: N/A;  . EYE SURGERY    . FOOT SURGERY Left 2005   "fixed bone that stuck out in my ankle area"  . HEMORRHOID BANDING    . IMPLANTABLE CARDIOVERTER DEFIBRILLATOR IMPLANT  09/06/09   BSX dual chamber ICD implanted in [place] for cardiac arrest 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  . INGUINAL HERNIA REPAIR Right ~ 1995  . LEFT HEART CATHETERIZATION WITH CORONARY ANGIOGRAM N/A 11/25/2012   demonstrated EF 30%, inferior akinesis with mild hypokinesis of all walls, patent LAD and RCA stents; ostial PDA with 80-90% obstruction with medical therapy recommended  . MOHS SURGERY  2008   nose, skin graft  . POLYPECTOMY  12/21/2017   Procedure: POLYPECTOMY;  Surgeon: [name], MD;  Location: [facility];  Service: Endoscopy;;  . RETINAL DETACHMENT SURGERY Right 2013  . TENOLYSIS Right 12/21/2013   Procedure: TENOLYSIS FLEXOR CARPI RADIALIS ,DEBRIDEMENT RIGHT JOINT WRIST,DEBRIDEMENT SCAPHOTRAPEZIAL TRAPEZOID, REPAIR OF EXTENSOR HOOD;  Surgeon: Daryll Brod, MD;  Location: [facility];  Service: Orthopedics;  Laterality: Right;  . V-TACH ABLATION  12/21/2014  . VIDEO BRONCHOSCOPY Bilateral 01/09/2016   Procedure: VIDEO BRONCHOSCOPY WITHOUT FLUORO;  Surgeon: Juanito Doom, MD;  Location: [facility];  Service: Cardiopulmonary;   Laterality: Bilateral;    Allergies  Allergen Reactions  . Sulfa Antibiotics Hives    Outpatient Encounter Medications as of 09/11/2019  Medication Sig  . albuterol (PROVENTIL) (2.5 MG/3ML) 0.083% nebulizer solution USE 1 VIAL VIA NEBULIZER EVERY 6 HOURS AS NEEDED FOR WHEEZING OR SHORTNESS OF BREATH  . amiodarone (PACERONE) 200 MG tablet Take 1 tablet (200 mg total) by mouth daily.  [name] apixaban (ELIQUIS) 5 MG TABS tablet Take 1 tablet (5 mg total) by mouth 2 (two) times daily.  . Artificial Tear Solution (SOOTHE XP OP) Place 2 drops into both eyes daily as needed (dry eyes).  [name] aspirin EC 81 MG tablet Take 1 tablet (81 mg total) by mouth every Monday, Wednesday, and Friday.  [name] atorvastatin (LIPITOR) 80 MG tablet TAKE 1 TABLET(80 MG) BY MOUTH DAILY  . benazepril (LOTENSIN) 10 MG tablet Take 5 mg by mouth daily.  [name] buPROPion (WELLBUTRIN) 75 MG tablet TAKE 1 TABLET(75 MG) BY MOUTH DAILY  . busPIRone (BUSPAR) 15 MG tablet TAKE 1 TABLET(15 MG) BY MOUTH THREE TIMES DAILY  . carvedilol (COREG) 3.125  MG tablet TAKE 1 TABLET(3.125 MG) BY MOUTH TWICE DAILY  . Choline Fenofibrate (FENOFIBRIC ACID) 135 MG CPDR TAKE 1 CAPSULE BY MOUTH DAILY  . escitalopram (LEXAPRO) 20 MG tablet TAKE 1 TABLET(20 MG) BY MOUTH DAILY  . fluticasone (FLONASE) 50 MCG/ACT nasal spray INSTILL 2 SPRAYS INTO EACH NOSTRIL QD  . furosemide (LASIX) 40 MG tablet TAKE 1/2 TABLET(20 MG) BY MOUTH DAILY  . Ipratropium-Albuterol (COMBIVENT RESPIMAT) 20-100 MCG/ACT AERS respimat Inhale 1 puff into the lungs every 6 (six) hours. Shortness of breath or wheezing  . IRON PO Take by mouth daily.   [name] MAGNESIUM-OXIDE 400 (241.3 Mg) MG tablet TAKE 1 TABSULE BY MOUTH DAILY  . mexiletine (MEXITIL) 200 MG capsule TAKE 1 CAPSULE(200 MG) BY MOUTH TWICE DAILY  . Multiple Vitamins-Minerals (CENTRUM ADULTS PO) Take by mouth daily.  . nitroGLYCERIN (NITROSTAT) 0.4 MG SL tablet DISSOLVE 1 TABLET UNDER THE TONGUE EVERY 5 MINUTES FOR 3 DOSES AS NEEDED FOR  CHEST PAIN  . omeprazole (PRILOSEC) 20 MG capsule Take 20 mg by mouth daily.   . potassium chloride SA (KLOR-CON) 20 MEQ tablet Take one tablet by mouth once daily  . Respiratory Therapy Supplies (FLUTTER) DEVI Use as directed  . senna (SENOKOT) 8.6 MG tablet Take 1-2 tablets by mouth daily.  [name] SPIRIVA RESPIMAT 2.5 MCG/ACT AERS INHALE 2 PUFFS INTO THE LUNGS DAILY  . SYMBICORT 160-4.5 MCG/ACT inhaler INHALE 2 PUFFS INTO THE LUNGS TWICE DAILY  . tamsulosin (FLOMAX) 0.4 MG CAPS capsule TAKE 1 CAPSULE(0.4 MG) BY MOUTH DAILY AFTER SUPPER  . vitamin B-12 (CYANOCOBALAMIN) 1000 MCG tablet Take 1 tablet (1,000 mcg total) by mouth daily.   No facility-administered encounter medications on file as of 09/11/2019.    Review of Systems  Constitutional: Negative for appetite change, chills, fatigue and fever.  HENT: Negative for congestion, postnasal drip, rhinorrhea, sinus pressure, sinus pain, sneezing, sore throat and tinnitus.   Eyes: Positive for visual disturbance. Negative for pain, discharge and itching.       Wears eye glasses   Respiratory: Positive for cough and wheezing. Negative for chest tightness.        Shortness of breath with exertion   Cardiovascular: Negative for chest pain, palpitations and leg swelling.  Gastrointestinal: Negative for abdominal distention, abdominal pain, constipation, diarrhea, nausea and vomiting.       Took epsom salt laxative had loose stool on x 2 days over the weekend but is resolved.  Musculoskeletal: Positive for gait problem. Negative for arthralgias, joint swelling and myalgias.  Skin: Negative for color change, pallor and rash.       Bruises on legs   Neurological: Negative for dizziness, speech difficulty, light-headedness, numbness and headaches.       Reports generalized weakness x 3 days.   Hematological: Bruises/bleeds easily.  Psychiatric/Behavioral: Negative for agitation.    Immunization History  Administered Date(s) Administered  . Fluad  Quad(high Dose 65+) 12/14/2018  . Influenza, High Dose Seasonal PF 12/17/2012, 01/06/2016, 12/21/2016  . Influenza,inj,Quad PF,6+ Mos 12/25/2013, 12/22/2014  . Influenza-Unspecified 12/28/2017  . PFIZER SARS-COV-2 Vaccination 04/19/2019, 05/10/2019  . Pneumococcal Conjugate-13 05/11/2014  . Pneumococcal-Unspecified 10/17/2010  . Tdap 05/11/2014  . Zoster 03/30/2008   Pertinent  Health Maintenance Due  Topic Date Due  . INFLUENZA VACCINE  10/29/2019  . PNA vac Low Risk Adult  Completed   Fall Risk  09/11/2019 03/16/2019 02/27/2019 10/31/2018 02/15/2018  Falls in the past year? 0 1 1 1  0  Comment - - - [facility] Telephone  Survey: data to providers prior to load [facility] Telephone Survey: data to providers prior to load  Number falls in past yr: 0 0 0 1 -  Comment - - - [facility] Telephone Survey Actual Response = 1 -  Injury with Fall? 0 1 1 1  -  Comment - - Broke ribs and bone in hand - -  Risk for fall due to : - - History of fall(s) - -    Vitals:   09/11/19 1406  BP: 130/88  Pulse: 66  Resp: 16  Temp: 97.7 F (36.5 C)  SpO2: 93%  Weight: 190 lb 9.6 oz (86.5 kg)  Height: 6' (1.829 m)   Body mass index is 25.85 kg/m. Physical Exam Vitals reviewed.  Constitutional:      General: He is not in acute distress.    Appearance: He is overweight. He is not ill-appearing.  HENT:     Head: Normocephalic.     Mouth/Throat:     Mouth: Mucous membranes are moist.     Pharynx: Oropharynx is clear. No oropharyngeal exudate or posterior oropharyngeal erythema.  Eyes:     General: Scleral icterus present.        Right eye: No discharge.        Left eye: No discharge.     Extraocular Movements: Extraocular movements intact.     Conjunctiva/sclera: Conjunctivae normal.     Pupils: Pupils are equal, round, and reactive to light.  Neck:     Vascular: No carotid bruit.  Cardiovascular:     Rate and Rhythm: Normal rate and regular rhythm.     Pulses: Normal pulses.     Heart sounds: Normal heart  sounds. No murmur heard.  No friction rub. No gallop.   Pulmonary:     Effort: Pulmonary effort is normal. No respiratory distress.     Breath sounds: Examination of the right-upper field reveals rhonchi. Examination of the left-upper field reveals rhonchi. Examination of the right-middle field reveals rales. Examination of the left-middle field reveals wheezing. Examination of the right-lower field reveals rales. Examination of the left-lower field reveals wheezing. Wheezing, rhonchi and rales present.  Chest:     Chest wall: No tenderness.  Abdominal:     General: Bowel sounds are normal. There is no distension.     Palpations: Abdomen is soft. There is no mass.     Tenderness: There is no abdominal tenderness. There is no right CVA tenderness, left CVA tenderness, guarding or rebound.  Musculoskeletal:        General: No swelling or tenderness.     Cervical back: Normal range of motion. No tenderness.     Right lower leg: No edema.     Left lower leg: No edema.     Comments: Unsteady gait walks with a cane  Lymphadenopathy:     Cervical: No cervical adenopathy.  Skin:    General: Skin is warm.     Coloration: Skin is not pale.     Findings: No bruising or erythema.  Neurological:     Mental Status: He is alert and oriented to person, place, and time.     Cranial Nerves: No cranial nerve deficit.     Sensory: No sensory deficit.     Motor: No weakness.     Gait: Gait abnormal.  Psychiatric:        Mood and Affect: Mood normal.        Behavior: Behavior normal.        Thought  Content: Thought content normal.        Judgment: Judgment normal.    Labs reviewed: Recent Labs    09/28/18 1030 12/15/18 0101 02/27/19 1056  NA 138 138 139  K 3.6 4.0 4.3  CL 102 105 103  CO2 29 26 30   GLUCOSE 69* 99 91  BUN 11 12 11   CREATININE 0.92 1.05 0.97  CALCIUM 9.6 9.3 9.4  MG 1.7  --   --    Recent Labs    09/28/18 1030 12/15/18 0101 02/27/19 1056  AST 13 18 14   ALT 15 25 16     ALKPHOS 43 51  --   BILITOT 0.6 0.6 0.5  PROT 6.7 6.6 6.4  ALBUMIN 4.2 3.9  --    Recent Labs    09/28/18 1030 12/15/18 0101 02/27/19 1056  WBC 7.2 6.8 8.1  NEUTROABS  --  4.2 5,638  HGB 13.7 13.3 13.2  HCT 41.8 40.1 39.6  MCV 100.4* 102.8* 99.2  PLT 209.0 191 212   Lab Results  Component Value Date   TSH 1.97 09/28/2018   Lab Results  Component Value Date   HGBA1C 5.6 12/29/2016   Lab Results  Component Value Date   CHOL 142 02/27/2019   HDL 48 02/27/2019   LDLCALC 78 02/27/2019   TRIG 79 02/27/2019   CHOLHDL 3.0 02/27/2019    Significant Diagnostic Results in last 30 days:  No results found.  Assessment/Plan COPD with acute exacerbation (HCC) Afebrile.upper lobes rhonchi,bilateral lower lobes rales and wheezes noted.Will treat with tapered prednisone as below and obtain imaging to rule out acute abnormalities. - predniSONE (DELTASONE) 10 MG tablet; - Prednisone 10 mg tablet take 4 tablets ( 40 mg ) by mouth x 1 day then  Take 3 Tablet ( 30 mg ) by mouth x 1 day then  Take 2 tablet ( 20 mg ) by mouth x 1 day then  Take 1 tablet ( 10 mg ) by mouth and stop.  Dispense: 10 tablet; Refill: 0 - DG Chest 2 View; Future  Family/ staff Communication: Reviewed plan of care with patient verbalized understanding.  Labs/tests ordered: - DG Chest 2 View; Future  Next Appointment: As needed if symptoms worsen or fail to improve.   Sandrea Hughs, NP

## 2019-09-12 ENCOUNTER — Other Ambulatory Visit: Payer: Self-pay | Admitting: Pulmonary Disease

## 2019-09-12 MED ORDER — OPTICHAMBER DIAMOND MISC: 0 refills | Status: AC

## 2019-09-12 NOTE — Progress Notes (Signed)
[facility] prescription sent into pharmacy

## 2019-09-15 ENCOUNTER — Telehealth: Payer: Self-pay | Admitting: Interventional Cardiology

## 2019-09-15 NOTE — Telephone Encounter (Signed)
Please comment on eliquis. 

## 2019-09-15 NOTE — Telephone Encounter (Signed)
    [facility] Pre-operative Risk Assessment    Request for surgical clearance:  1. What type of surgery is being performed? Arthroplasty of 3rd toe of right foot   2. When is this surgery scheduled? 10/06/19   3. What type of clearance is required (medical clearance vs. Pharmacy clearance to hold med vs. Both)? Both  4. Are there any medications that need to be held prior to surgery and how long? Eliquis   5. Practice name and name of physician performing surgery? [facility], Dr. Clide Dales  6. What is your office phone number [phone]    7.   What is your office fax number?  8.   Anesthesia type (None, local, MAC, general) ? MAC   Steven Guzman 09/15/2019, 10:07 AM  _________________________________________________________________   (provider comments below)

## 2019-09-15 NOTE — Telephone Encounter (Signed)
Patient with diagnosis of ATRIAL FIBRILLATION on ELIQUIS for anticoagulation.    Procedure:TOE ARTHROPLASTY Date of procedure: 10-06-2019  CHADS2-VASc score of  5 (CHF, HTN, AGE x 2,CAD)  CrCl = 74ML/MIN   Per office protocol, patient can hold ELIQUIS for 2 days prior to procedure.

## 2019-09-18 NOTE — Telephone Encounter (Signed)
   Primary Cardiologist: Sinclair Grooms, MD  Chart reviewed as part of pre-operative protocol coverage. Given past medical history and time since last visit, based on ACC/AHA guidelines, BRIAN ZEITLIN would be at acceptable risk for the planned procedure without further cardiovascular testing.   I will route this recommendation to the requesting party via Epic fax function and remove from pre-op pool.  Please call with questions.  [place], [place] 09/18/2019, 10:41 AM

## 2019-09-27 NOTE — Telephone Encounter (Signed)
° °  Dr Gean Quint office is calling requesting we fax the surgical clearance to their office [phone] [place]: Lelan Pons

## 2019-09-28 HISTORY — PX: TOE SURGERY: SHX1073

## 2019-10-04 ENCOUNTER — Other Ambulatory Visit: Payer: Self-pay | Admitting: Pulmonary Disease

## 2019-10-06 DIAGNOSIS — R799 Abnormal finding of blood chemistry, unspecified: Secondary | ICD-10-CM | POA: Diagnosis not present

## 2019-10-06 DIAGNOSIS — M2041 Other hammer toe(s) (acquired), right foot: Secondary | ICD-10-CM | POA: Diagnosis not present

## 2019-10-12 ENCOUNTER — Ambulatory Visit (INDEPENDENT_AMBULATORY_CARE_PROVIDER_SITE_OTHER): Payer: Medicare Other | Admitting: *Deleted

## 2019-10-12 DIAGNOSIS — I480 Paroxysmal atrial fibrillation: Secondary | ICD-10-CM

## 2019-10-12 DIAGNOSIS — I472 Ventricular tachycardia, unspecified: Secondary | ICD-10-CM

## 2019-10-12 LAB — CUP PACEART REMOTE DEVICE CHECK
Battery Remaining Longevity: 24 mo
Battery Remaining Percentage: 31 %
Brady Statistic RA Percent Paced: 2 %
Brady Statistic RV Percent Paced: 0 %
Date Time Interrogation Session: 20210715093900
HighPow Impedance: 53 Ohm
Implantable Lead Implant Date: 20110610
Implantable Lead Implant Date: 20110610
Implantable Lead Location: 753859
Implantable Lead Location: 753860
Implantable Lead Model: 185
Implantable Lead Model: 4135
Implantable Lead Serial Number: 28681386
Implantable Lead Serial Number: 339643
Implantable Pulse Generator Implant Date: 20110610
Lead Channel Impedance Value: 462 Ohm
Lead Channel Impedance Value: 615 Ohm
Lead Channel Pacing Threshold Amplitude: 0.9 V
Lead Channel Pacing Threshold Amplitude: 1.1 V
Lead Channel Pacing Threshold Pulse Width: 0.4 ms
Lead Channel Pacing Threshold Pulse Width: 0.4 ms
Lead Channel Setting Pacing Amplitude: 2 V
Lead Channel Setting Pacing Amplitude: 2.2 V
Lead Channel Setting Pacing Pulse Width: 0.4 ms
Lead Channel Setting Sensing Sensitivity: 0.6 mV
Pulse Gen Serial Number: 164892

## 2019-10-13 NOTE — Progress Notes (Signed)
Remote ICD transmission.   

## 2019-10-21 ENCOUNTER — Other Ambulatory Visit: Payer: Self-pay | Admitting: Nurse Practitioner

## 2019-10-23 DIAGNOSIS — M2041 Other hammer toe(s) (acquired), right foot: Secondary | ICD-10-CM | POA: Diagnosis not present

## 2019-11-06 DIAGNOSIS — Z4789 Encounter for other orthopedic aftercare: Secondary | ICD-10-CM | POA: Diagnosis not present

## 2019-11-06 DIAGNOSIS — M2041 Other hammer toe(s) (acquired), right foot: Secondary | ICD-10-CM | POA: Diagnosis not present

## 2019-11-17 ENCOUNTER — Other Ambulatory Visit: Payer: Self-pay | Admitting: Nurse Practitioner

## 2019-11-28 ENCOUNTER — Other Ambulatory Visit: Payer: Self-pay | Admitting: Nurse Practitioner

## 2019-12-06 ENCOUNTER — Other Ambulatory Visit: Payer: Self-pay | Admitting: Interventional Cardiology

## 2019-12-06 DIAGNOSIS — I472 Ventricular tachycardia, unspecified: Secondary | ICD-10-CM

## 2019-12-07 ENCOUNTER — Other Ambulatory Visit: Payer: Self-pay | Admitting: Internal Medicine

## 2019-12-11 ENCOUNTER — Other Ambulatory Visit: Payer: Self-pay | Admitting: Nurse Practitioner

## 2019-12-18 MED FILL — FLUAD QUADRIVALENT 0.5 ML P: 0.5 | 1 days supply | Qty: 1 | Fill #0

## 2019-12-19 ENCOUNTER — Other Ambulatory Visit: Payer: Self-pay | Admitting: Nurse Practitioner

## 2019-12-19 ENCOUNTER — Other Ambulatory Visit: Payer: Self-pay | Admitting: Pulmonary Disease

## 2019-12-19 DIAGNOSIS — I5022 Chronic systolic (congestive) heart failure: Secondary | ICD-10-CM

## 2019-12-19 DIAGNOSIS — J432 Centrilobular emphysema: Secondary | ICD-10-CM

## 2019-12-19 DIAGNOSIS — I25810 Atherosclerosis of coronary artery bypass graft(s) without angina pectoris: Secondary | ICD-10-CM

## 2020-01-11 ENCOUNTER — Ambulatory Visit (INDEPENDENT_AMBULATORY_CARE_PROVIDER_SITE_OTHER): Payer: Medicare Other

## 2020-01-11 DIAGNOSIS — I472 Ventricular tachycardia, unspecified: Secondary | ICD-10-CM

## 2020-01-16 LAB — CUP PACEART REMOTE DEVICE CHECK
Battery Remaining Longevity: 24 mo
Battery Remaining Percentage: 30 %
Brady Statistic RA Percent Paced: 2 %
Brady Statistic RV Percent Paced: 0 %
Date Time Interrogation Session: 20211018091500
HighPow Impedance: 55 Ohm
Implantable Lead Implant Date: 20110610
Implantable Lead Implant Date: 20110610
Implantable Lead Location: 753859
Implantable Lead Location: 753860
Implantable Lead Model: 185
Implantable Lead Model: 4135
Implantable Lead Serial Number: 28681386
Implantable Lead Serial Number: 339643
Implantable Pulse Generator Implant Date: 20110610
Lead Channel Impedance Value: 484 Ohm
Lead Channel Impedance Value: 621 Ohm
Lead Channel Pacing Threshold Amplitude: 0.9 V
Lead Channel Pacing Threshold Amplitude: 1.1 V
Lead Channel Pacing Threshold Pulse Width: 0.4 ms
Lead Channel Pacing Threshold Pulse Width: 0.4 ms
Lead Channel Setting Pacing Amplitude: 2 V
Lead Channel Setting Pacing Amplitude: 2.2 V
Lead Channel Setting Pacing Pulse Width: 0.4 ms
Lead Channel Setting Sensing Sensitivity: 0.6 mV
Pulse Gen Serial Number: 164892

## 2020-01-16 NOTE — Progress Notes (Signed)
Remote ICD transmission.   

## 2020-02-03 ENCOUNTER — Other Ambulatory Visit: Payer: Self-pay | Admitting: Nurse Practitioner

## 2020-02-05 ENCOUNTER — Other Ambulatory Visit: Payer: Self-pay

## 2020-02-05 ENCOUNTER — Ambulatory Visit (INDEPENDENT_AMBULATORY_CARE_PROVIDER_SITE_OTHER): Payer: Medicare Other | Admitting: Internal Medicine

## 2020-02-05 ENCOUNTER — Encounter: Payer: Self-pay | Admitting: Internal Medicine

## 2020-02-05 VITALS — BP 122/60 | HR 60 | Temp 97.8°F | Ht 72.0 in | Wt 190.6 lb

## 2020-02-05 DIAGNOSIS — I25810 Atherosclerosis of coronary artery bypass graft(s) without angina pectoris: Secondary | ICD-10-CM | POA: Diagnosis not present

## 2020-02-05 DIAGNOSIS — R6883 Chills (without fever): Secondary | ICD-10-CM | POA: Diagnosis not present

## 2020-02-05 DIAGNOSIS — J438 Other emphysema: Secondary | ICD-10-CM | POA: Diagnosis not present

## 2020-02-05 DIAGNOSIS — Z79899 Other long term (current) drug therapy: Secondary | ICD-10-CM | POA: Diagnosis not present

## 2020-02-05 DIAGNOSIS — I5022 Chronic systolic (congestive) heart failure: Secondary | ICD-10-CM

## 2020-02-05 DIAGNOSIS — J0111 Acute recurrent frontal sinusitis: Secondary | ICD-10-CM | POA: Diagnosis not present

## 2020-02-05 MED ORDER — AMOXICILLIN-POT CLAVULANATE 875-125 MG PO TABS: 1.0000 | ORAL_TABLET | Freq: Two times a day (BID) | ORAL | 0 refills | Status: DC

## 2020-02-05 NOTE — Progress Notes (Signed)
Location:  [facility] clinic Provider: Lissete Maestas L. Mariea Clonts, D.O., C.M.D.  Goals of Care:  Advanced Directives 09/11/2019  Does Patient Have a Medical Advance Directive? No  Does patient want to make changes to medical advance directive? -  Would patient like information on creating a medical advance directive? No - Patient declined     Chief Complaint  Patient presents with  . Acute Visit    headache for a couple of weeks in the front and back of head, fatique, feel conjested and dizzy and coughing some     HPI: Patient is a 79 y.o. male seen today for an acute visit for headache in temples and back of neck for 2 wks, some coughing, feels congested and dizzy.  He's afebrile.  He has emphysema, CAD, chronic systolic chf and advanced age.   He reports the frontal headache got worse over the weekend.  He also may have some neck arthritis.  He notes he did some flushing with saline and baking soda, but it did not help.  He'd been having to use his nebulizer twice a day, when he'd used it prn and that led to him quitting smoking.  He noted his head got stopped up again last night when he laid down and that's why he opted to quit smoking.  He's also been dizzy and thinks it's from the congestion.  He's had a cough with a bit of mucus congestion in the back of his throat that's been hard to clear He just quit smoking along with his wife.  Last Sunday.      We discussed his need for thyroid testing due to his amiodarone--looks like he had testing last year but not this year; however, given we are waiting on his covid test to return, we could not do further labs today.  Past Medical History:  Diagnosis Date  . AICD (automatic cardioverter/defibrillator) present   . Aneurysm ([place])    a. Aneurysmal infrarenal aorta up to 33 mm on CT 10/2014, recommended f/u due 10/2017  . Anginal pain ([place])   . Anxiety   . Basal cell carcinoma of nose    S/P MOHS  . Biliary acute pancreatitis   . CAD (coronary artery  disease)    a. s/p MI in 1994 with PCI to LAD at that time b. cath 10/2012 demonstrated EF 30%, inferior akinesis with mild hypokinesis of all walls, patent LAD and RCA stents; ostial PDA with 80-90% obstruction with medical therapy recommended   . Chronic systolic CHF (congestive heart failure) (HCC)    EF 30 to 35 % as of 09/2014.   [name] CKD (chronic kidney disease), stage III   . Complication of anesthesia 10/2014   "had to have defibrillator w/ERCP"  . COPD (chronic obstructive pulmonary disease) ([place])    a. followed by pulmonary, COPD GOLD stage II  . Depression   . Diverticulosis of colon 07/2014   noted on CT  . GERD (gastroesophageal reflux disease)   . Hiatal hernia   . Hyperglycemia 10/2012.  [name] Hyperlipidemia   . Hypertension   . Myocardial infarction [facility]) 1994; 2011  . Pneumonia 1946; 2015  . Prostate enlargement 07/2014   observed on CT  . Tobacco abuse   . Ventricular tachycardia ([place])    a. 08/2009 s/p BSX E110 Teligen 100 AICD, ser#: [id number];  b. 08/2008 VT req ATP - detection reprogrammed from 160 to 150. c. EPS and VT ablation by Dr. Lovena Le 12/21/2014    Past Surgical  History:  Procedure Laterality Date  . BIOPSY  12/21/2017   Procedure: BIOPSY;  Surgeon: [name], MD;  Location: [facility];  Service: Endoscopy;;  . CATARACT EXTRACTION W/ INTRAOCULAR LENS  IMPLANT, BILATERAL Bilateral ~ 2011  . COLONOSCOPY    . COLONOSCOPY WITH PROPOFOL N/A 12/21/2017   Procedure: COLONOSCOPY WITH PROPOFOL;  Surgeon: [name], MD;  Location: [facility];  Service: Endoscopy;  Laterality: N/A;  . ELECTROPHYSIOLOGIC STUDY N/A 12/21/2014   Procedure: V Tach Ablation;  Surgeon: [name], MD;  Location: [place] [facility] LAB;  Service: Cardiovascular;  Laterality: N/A;  . ERCP N/A 11/16/2014   Procedure: ENDOSCOPIC RETROGRADE CHOLANGIOPANCREATOGRAPHY (ERCP);  Surgeon: [name], MD;  Location: [place];  Service: Endoscopy;  Laterality: N/A;  . ESOPHAGOGASTRODUODENOSCOPY  (EGD) WITH PROPOFOL N/A 12/21/2017   Procedure: ESOPHAGOGASTRODUODENOSCOPY (EGD) WITH PROPOFOL;  Surgeon: [name], MD;  Location: [facility];  Service: Endoscopy;  Laterality: N/A;  . EYE SURGERY    . FOOT SURGERY Left 2005   "fixed bone that stuck out in my ankle area"  . HEMORRHOID BANDING    . IMPLANTABLE CARDIOVERTER DEFIBRILLATOR IMPLANT  09/06/09   BSX dual chamber ICD implanted in [place] for cardiac arrest 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  . INGUINAL HERNIA REPAIR Right ~ 1995  . LEFT HEART CATHETERIZATION WITH CORONARY ANGIOGRAM N/A 11/25/2012   demonstrated EF 30%, inferior akinesis with mild hypokinesis of all walls, patent LAD and RCA stents; ostial PDA with 80-90% obstruction with medical therapy recommended  . MOHS SURGERY  2008   nose, skin graft  . POLYPECTOMY  12/21/2017   Procedure: POLYPECTOMY;  Surgeon: [name], MD;  Location: [facility];  Service: Endoscopy;;  . RETINAL DETACHMENT SURGERY Right 2013  . TENOLYSIS Right 12/21/2013   Procedure: TENOLYSIS FLEXOR CARPI RADIALIS ,DEBRIDEMENT RIGHT JOINT WRIST,DEBRIDEMENT SCAPHOTRAPEZIAL TRAPEZOID, REPAIR OF EXTENSOR HOOD;  Surgeon: Daryll Brod, MD;  Location: [place];  Service: Orthopedics;  Laterality: Right;  . V-TACH ABLATION  12/21/2014  . VIDEO BRONCHOSCOPY Bilateral 01/09/2016   Procedure: VIDEO BRONCHOSCOPY WITHOUT FLUORO;  Surgeon: Juanito Doom, MD;  Location: [facility];  Service: Cardiopulmonary;  Laterality: Bilateral;    Allergies  Allergen Reactions  . Sulfa Antibiotics Hives    Outpatient Encounter Medications as of 02/05/2020  Medication Sig  . albuterol (PROVENTIL) (2.5 MG/3ML) 0.083% nebulizer solution USE 1 VIAL VIA NEBULIZER EVERY 6 HOURS AS NEEDED FOR WHEEZING OR SHORTNESS OF BREATH  . amiodarone (PACERONE) 200 MG tablet TAKE 1 TABLET BY MOUTH EVERY DAY  . apixaban (ELIQUIS) 5 MG TABS tablet Take 1 tablet (5 mg total) by mouth 2 (two) times daily.  . Artificial Tear Solution  (SOOTHE XP OP) Place 2 drops into both eyes daily as needed (dry eyes).  [name] aspirin EC 81 MG tablet Take 1 tablet (81 mg total) by mouth every Monday, Wednesday, and Friday.  [name] atorvastatin (LIPITOR) 80 MG tablet TAKE 1 TABLET(80 MG) BY MOUTH DAILY  . benazepril (LOTENSIN) 10 MG tablet Take 5 mg by mouth daily.  [name] buPROPion (WELLBUTRIN) 75 MG tablet TAKE 1 TABLET BY MOUTH EVERY DAY  . busPIRone (BUSPAR) 15 MG tablet TAKE 1 TABLET(15 MG) BY MOUTH THREE TIMES DAILY  . carvedilol (COREG) 3.125 MG tablet TAKE 1 TABLET BY MOUTH TWICE A DAY  . Choline Fenofibrate (FENOFIBRIC ACID) 135 MG CPDR TAKE 1 CAPSULE BY MOUTH DAILY  . escitalopram (LEXAPRO) 20 MG tablet TAKE 1 TABLET BY MOUTH EVERY  DAY  . fluticasone (FLONASE) 50 MCG/ACT nasal spray INSTILL 2 SPRAYS INTO EACH NOSTRIL QD  . furosemide (LASIX) 40 MG tablet TAKE 1/2 TABLET(20 MG) BY MOUTH DAILY  . Ipratropium-Albuterol (COMBIVENT RESPIMAT) 20-100 MCG/ACT AERS respimat Inhale 1 puff into the lungs every 6 (six) hours. Shortness of breath or wheezing  . IRON PO Take by mouth daily.   . magnesium oxide (MAG-OX) 400 MG tablet TAKE 1 TABLET BY MOUTH EVERY DAY  . MAGNESIUM-OXIDE 400 (241.3 Mg) MG tablet TAKE 1 TABSULE BY MOUTH DAILY  . mexiletine (MEXITIL) 200 MG capsule TAKE 1 CAPSULE(200 MG) BY MOUTH TWICE DAILY  . Multiple Vitamins-Minerals (CENTRUM ADULTS PO) Take by mouth daily.  . nitroGLYCERIN (NITROSTAT) 0.4 MG SL tablet DISSOLVE 1 TABLET UNDER THE TONGUE EVERY 5 MINUTES FOR 3 DOSES AS NEEDED FOR CHEST PAIN  . omeprazole (PRILOSEC) 20 MG capsule Take 20 mg by mouth daily.   . potassium chloride SA (KLOR-CON M20) 20 MEQ tablet TAKE 1 TABLET BY MOUTH EVERY DAY  . predniSONE (DELTASONE) 10 MG tablet - Prednisone 10 mg tablet take 4 tablets ( 40 mg ) by mouth x 1 day then  Take 3 Tablet ( 30 mg ) by mouth x 1 day then  Take 2 tablet ( 20 mg ) by mouth x 1 day then  Take 1 tablet ( 10 mg ) by mouth and stop.  [name] Respiratory Therapy Supplies  (FLUTTER) DEVI Use as directed  . senna (SENOKOT) 8.6 MG tablet Take 1-2 tablets by mouth daily.  [name] Spacer/Aero-Holding Chambers (OPTICHAMBER DIAMOND) MISC optichamber VHC  . SPIRIVA RESPIMAT 2.5 MCG/ACT AERS INHALE 2 PUFFS INTO THE LUNGS DAILY  . SYMBICORT 160-4.5 MCG/ACT inhaler INHALE 2 PUFFS BY MOUTH TWICE A DAY  . tamsulosin (FLOMAX) 0.4 MG CAPS capsule TAKE 1 CAPSULE(0.4 MG) BY MOUTH DAILY AFTER SUPPER  . vitamin B-12 (CYANOCOBALAMIN) 1000 MCG tablet Take 1 tablet (1,000 mcg total) by mouth daily.   No facility-administered encounter medications on file as of 02/05/2020.    Review of Systems:  Review of Systems  Constitutional: Positive for chills and malaise/fatigue. Negative for fever.  HENT: Positive for congestion and sinus pain. Negative for sore throat.   Respiratory: Positive for cough and shortness of breath. Negative for sputum production and wheezing.   Cardiovascular: Negative for chest pain, palpitations and leg swelling.  Gastrointestinal: Positive for constipation. Negative for abdominal pain.       Hernia  Genitourinary: Positive for frequency. Negative for dysuria.  Musculoskeletal: Negative for falls.  Neurological: Positive for dizziness and headaches. Negative for loss of consciousness.  Psychiatric/Behavioral: Negative for depression and memory loss. The patient is not nervous/anxious and does not have insomnia.     Health Maintenance  Topic Date Due  . INFLUENZA VACCINE  10/29/2019  . TETANUS/TDAP  05/11/2024  . COVID-19 Vaccine  Completed  . PNA vac Low Risk Adult  Completed    Physical Exam: There were no vitals filed for this visit. There is no height or weight on file to calculate BMI. Physical Exam Vitals reviewed.  Constitutional:      General: He is not in acute distress.    Appearance: Normal appearance. He is not ill-appearing or toxic-appearing.  HENT:     Head: Normocephalic and atraumatic.     Nose: Congestion present.      Mouth/Throat:     Comments: Postnasal drip Eyes:     Comments: glasses  Cardiovascular:     Rate and Rhythm: Rhythm  irregular.     Heart sounds: No murmur heard.   Pulmonary:     Effort: Pulmonary effort is normal.     Breath sounds: No wheezing, rhonchi or rales.  Abdominal:     General: Bowel sounds are normal.  Musculoskeletal:        General: Normal range of motion.     Right lower leg: No edema.     Left lower leg: No edema.  Skin:    Findings: Bruising present.     Comments: Hyperpigmentation of arms  Neurological:     General: No focal deficit present.     Mental Status: He is alert and oriented to person, place, and time.  Psychiatric:        Mood and Affect: Mood normal.     Labs reviewed: Basic Metabolic Panel: Recent Labs    02/27/19 1056  NA 139  K 4.3  CL 103  CO2 30  GLUCOSE 91  BUN 11  CREATININE 0.97  CALCIUM 9.4   Liver Function Tests: Recent Labs    02/27/19 1056  AST 14  ALT 16  BILITOT 0.5  PROT 6.4   No results for input(s): LIPASE, AMYLASE in the last 8760 hours. No results for input(s): AMMONIA in the last 8760 hours. CBC: Recent Labs    02/27/19 1056  WBC 8.1  NEUTROABS 5,638  HGB 13.2  HCT 39.6  MCV 99.2  PLT 212   Lipid Panel: Recent Labs    02/27/19 1056  CHOL 142  HDL 48  LDLCALC 78  TRIG 79  CHOLHDL 3.0   Lab Results  Component Value Date   HGBA1C 5.6 12/29/2016    Procedures since last visit: [place]  Result Date: 01/16/2020 Scheduled remote reviewed. Normal device function.  10 stored NST, available EGM's show short PAT Next remote 91 days. JM   Assessment/Plan 1. Acute recurrent frontal sinusitis - advised continued saline flushes - tx w/ abx due to prolonged symptoms - r/o covid today - amoxicillin-clavulanate (AUGMENTIN) 875-125 MG tablet; Take 1 tablet by mouth 2 (two) times daily.  Dispense: 20 tablet; Refill: 0  2. Chills (without fever) -r/o covid--advised  isolation until results return - amoxicillin-clavulanate (AUGMENTIN) 875-125 MG tablet; Take 1 tablet by mouth 2 (two) times daily.  Dispense: 20 tablet; Refill: 0 - SARS-COV-2 RNA,(COVID-19) QUAL NAAT; Future - SARS-COV-2 RNA,(COVID-19) QUAL NAAT  3. Long term current use of amiodarone -requesting TSH with his next labs for this year  4. Paraseptal emphysema (HCC) -breathing improved with smoking cessation and sounds quite good today  5. Chronic systolic heart failure (HCC) -no signs of volume overload or chf contributing to current symptoms  Encouraged him to continue his smoking cessation journey  Labs/tests ordered:  covid test Next appt:  02/28/2020  Faria Casella L. Quinnie Barcelo, D.O. [facility] [address], [place] [address] Cell Phone (Mon-Fri 8am-5pm):  [phone] On Call:  [phone] & follow prompts after 5pm & weekends Office Phone:  [phone] Office Fax:  [phone]

## 2020-02-05 NOTE — Telephone Encounter (Signed)
High risk or very high risk warning populated when attempting to refill medication. RX request sent to PCP for review and approval if warranted.   

## 2020-02-05 NOTE — Patient Instructions (Signed)
Sinusitis, Adult Sinusitis is inflammation of your sinuses. Sinuses are hollow spaces in the bones around your face. Your sinuses are located:  Around your eyes.  In the middle of your forehead.  Behind your nose.  In your cheekbones. Mucus normally drains out of your sinuses. When your nasal tissues become inflamed or swollen, mucus can become trapped or blocked. This allows bacteria, viruses, and fungi to grow, which leads to infection. Most infections of the sinuses are caused by a virus. Sinusitis can develop quickly. It can last for up to 4 weeks (acute) or for more than 12 weeks (chronic). Sinusitis often develops after a cold. What are the causes? This condition is caused by anything that creates swelling in the sinuses or stops mucus from draining. This includes:  Allergies.  Asthma.  Infection from bacteria or viruses.  Deformities or blockages in your nose or sinuses.  Abnormal growths in the nose (nasal polyps).  Pollutants, such as chemicals or irritants in the air.  Infection from fungi (rare). What increases the risk? You are more likely to develop this condition if you:  Have a weak body defense system (immune system).  Do a lot of swimming or diving.  Overuse nasal sprays.  Smoke. What are the signs or symptoms? The main symptoms of this condition are pain and a feeling of pressure around the affected sinuses. Other symptoms include:  Stuffy nose or congestion.  Thick drainage from your nose.  Swelling and warmth over the affected sinuses.  Headache.  Upper toothache.  A cough that may get worse at night.  Extra mucus that collects in the throat or the back of the nose (postnasal drip).  Decreased sense of smell and taste.  Fatigue.  A fever.  Sore throat.  Bad breath. How is this diagnosed? This condition is diagnosed based on:  Your symptoms.  Your medical history.  A physical exam.  Tests to find out if your condition is  acute or chronic. This may include: ? Checking your nose for nasal polyps. ? Viewing your sinuses using a device that has a light (endoscope). ? Testing for allergies or bacteria. ? Imaging tests, such as an MRI or CT scan. In rare cases, a bone biopsy may be done to rule out more serious types of fungal sinus disease. How is this treated? Treatment for sinusitis depends on the cause and whether your condition is chronic or acute.  If caused by a virus, your symptoms should go away on their own within 10 days. You may be given medicines to relieve symptoms. They include: ? Medicines that shrink swollen nasal passages (topical intranasal decongestants). ? Medicines that treat allergies (antihistamines). ? A spray that eases inflammation of the nostrils (topical intranasal corticosteroids). ? Rinses that help get rid of thick mucus in your nose (nasal saline washes).  If caused by bacteria, your health care provider may recommend waiting to see if your symptoms improve. Most bacterial infections will get better without antibiotic medicine. You may be given antibiotics if you have: ? A severe infection. ? A weak immune system.  If caused by narrow nasal passages or nasal polyps, you may need to have surgery. Follow these instructions at home: Medicines  Take, use, or apply over-the-counter and prescription medicines only as told by your health care provider. These may include nasal sprays.  If you were prescribed an antibiotic medicine, take it as told by your health care provider. Do not stop taking the antibiotic even if you start   to feel better. Hydrate and humidify   Drink enough fluid to keep your urine pale yellow. Staying hydrated will help to thin your mucus.  Use a cool mist humidifier to keep the humidity level in your home above 50%.  Inhale steam for 10-15 minutes, 3-4 times a day, or as told by your health care provider. You can do this in the bathroom while a hot shower is  running.  Limit your exposure to cool or dry air. Rest  Rest as much as possible.  Sleep with your head raised (elevated).  Make sure you get enough sleep each night. General instructions   Apply a warm, moist washcloth to your face 3-4 times a day or as told by your health care provider. This will help with discomfort.  Wash your hands often with soap and water to reduce your exposure to germs. If soap and water are not available, use hand sanitizer.  Do not smoke. Avoid being around people who are smoking (secondhand smoke).  Keep all follow-up visits as told by your health care provider. This is important. Contact a health care provider if:  You have a fever.  Your symptoms get worse.  Your symptoms do not improve within 10 days. Get help right away if:  You have a severe headache.  You have persistent vomiting.  You have severe pain or swelling around your face or eyes.  You have vision problems.  You develop confusion.  Your neck is stiff.  You have trouble breathing. Summary  Sinusitis is soreness and inflammation of your sinuses. Sinuses are hollow spaces in the bones around your face.  This condition is caused by nasal tissues that become inflamed or swollen. The swelling traps or blocks the flow of mucus. This allows bacteria, viruses, and fungi to grow, which leads to infection.  If you were prescribed an antibiotic medicine, take it as told by your health care provider. Do not stop taking the antibiotic even if you start to feel better.  Keep all follow-up visits as told by your health care provider. This is important. This information is not intended to replace advice given to you by your health care provider. Make sure you discuss any questions you have with your health care provider. Document Revised: 08/16/2017 Document Reviewed: 08/16/2017 Elsevier Patient Education  2020 [employer] Inc.  

## 2020-02-06 LAB — SARS-COV-2 RNA,(COVID-19) QUALITATIVE NAAT: SARS CoV2 RNA: NOT DETECTED

## 2020-02-07 NOTE — Progress Notes (Signed)
Negative covid test

## 2020-02-13 DIAGNOSIS — D1801 Hemangioma of skin and subcutaneous tissue: Secondary | ICD-10-CM | POA: Insufficient documentation

## 2020-02-13 DIAGNOSIS — L810 Postinflammatory hyperpigmentation: Secondary | ICD-10-CM | POA: Diagnosis not present

## 2020-02-13 DIAGNOSIS — D229 Melanocytic nevi, unspecified: Secondary | ICD-10-CM | POA: Diagnosis not present

## 2020-02-13 DIAGNOSIS — D692 Other nonthrombocytopenic purpura: Secondary | ICD-10-CM | POA: Diagnosis not present

## 2020-02-13 DIAGNOSIS — L821 Other seborrheic keratosis: Secondary | ICD-10-CM | POA: Diagnosis not present

## 2020-02-13 DIAGNOSIS — L820 Inflamed seborrheic keratosis: Secondary | ICD-10-CM | POA: Diagnosis not present

## 2020-02-13 DIAGNOSIS — L570 Actinic keratosis: Secondary | ICD-10-CM | POA: Diagnosis not present

## 2020-02-17 IMAGING — DX DG CHEST 2V
3 series · 3 of 3 positions shown · non-contrast
Comparison: Chest radiographs 11/18/2018 and earlier.

CLINICAL DATA: 78-year-old male with hemoptysis tonight.

EXAM:
CHEST - 2 VIEW

[chest pa]
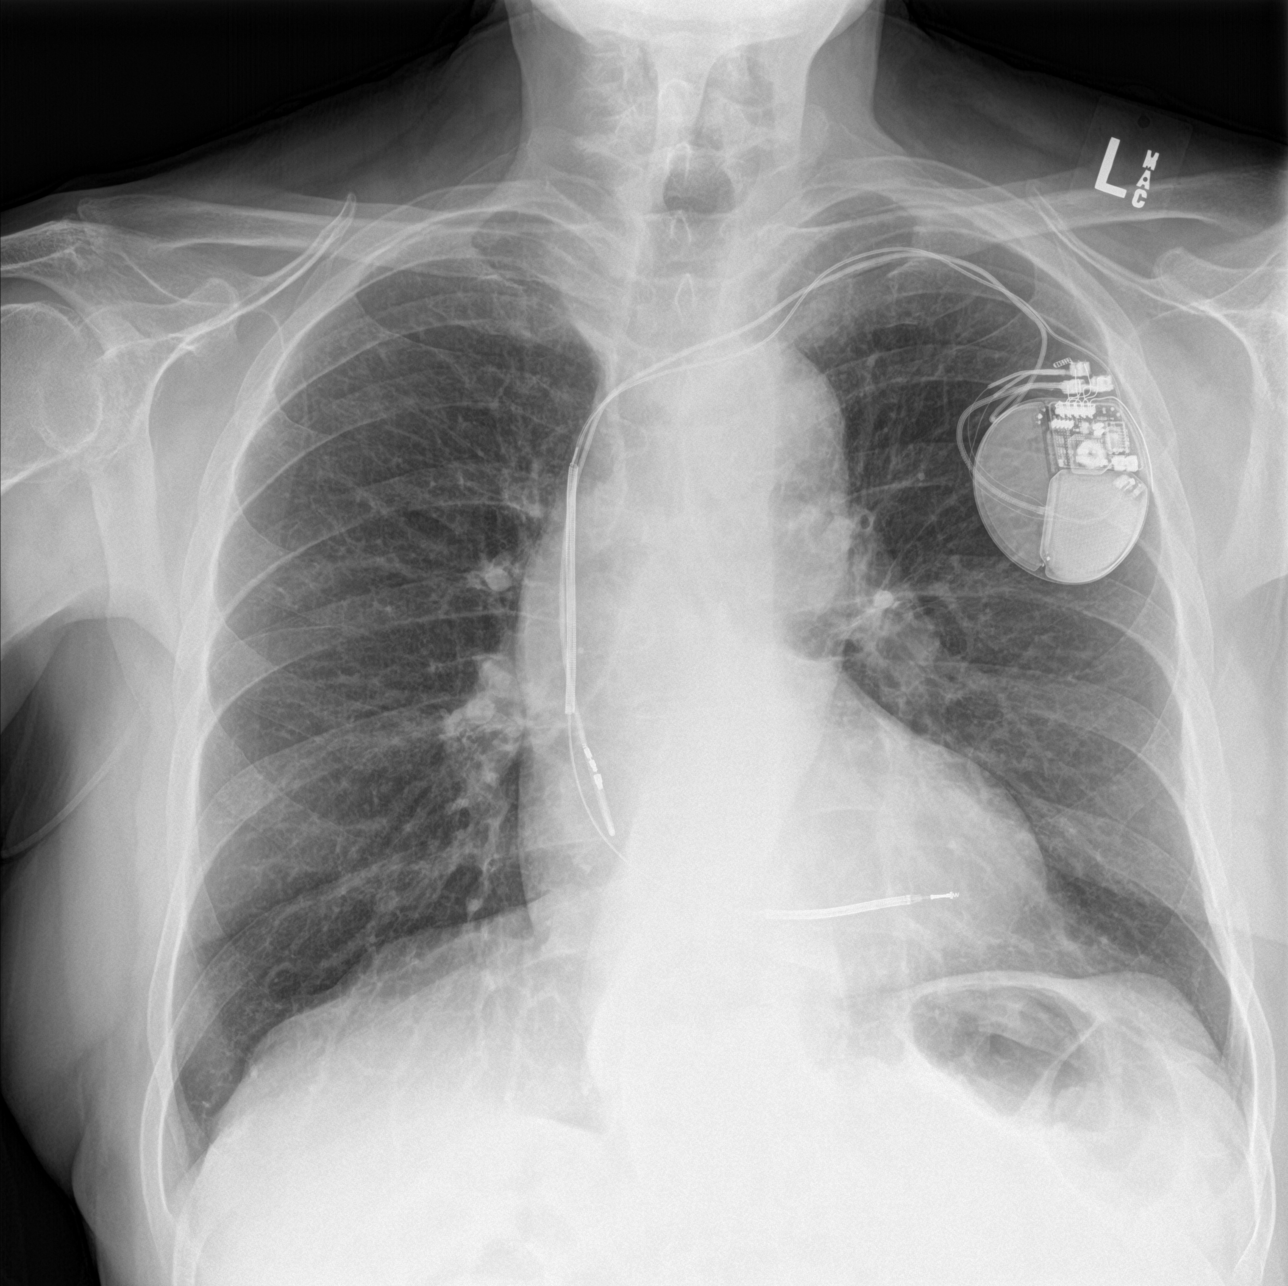

[chest lat (1 of 2)]
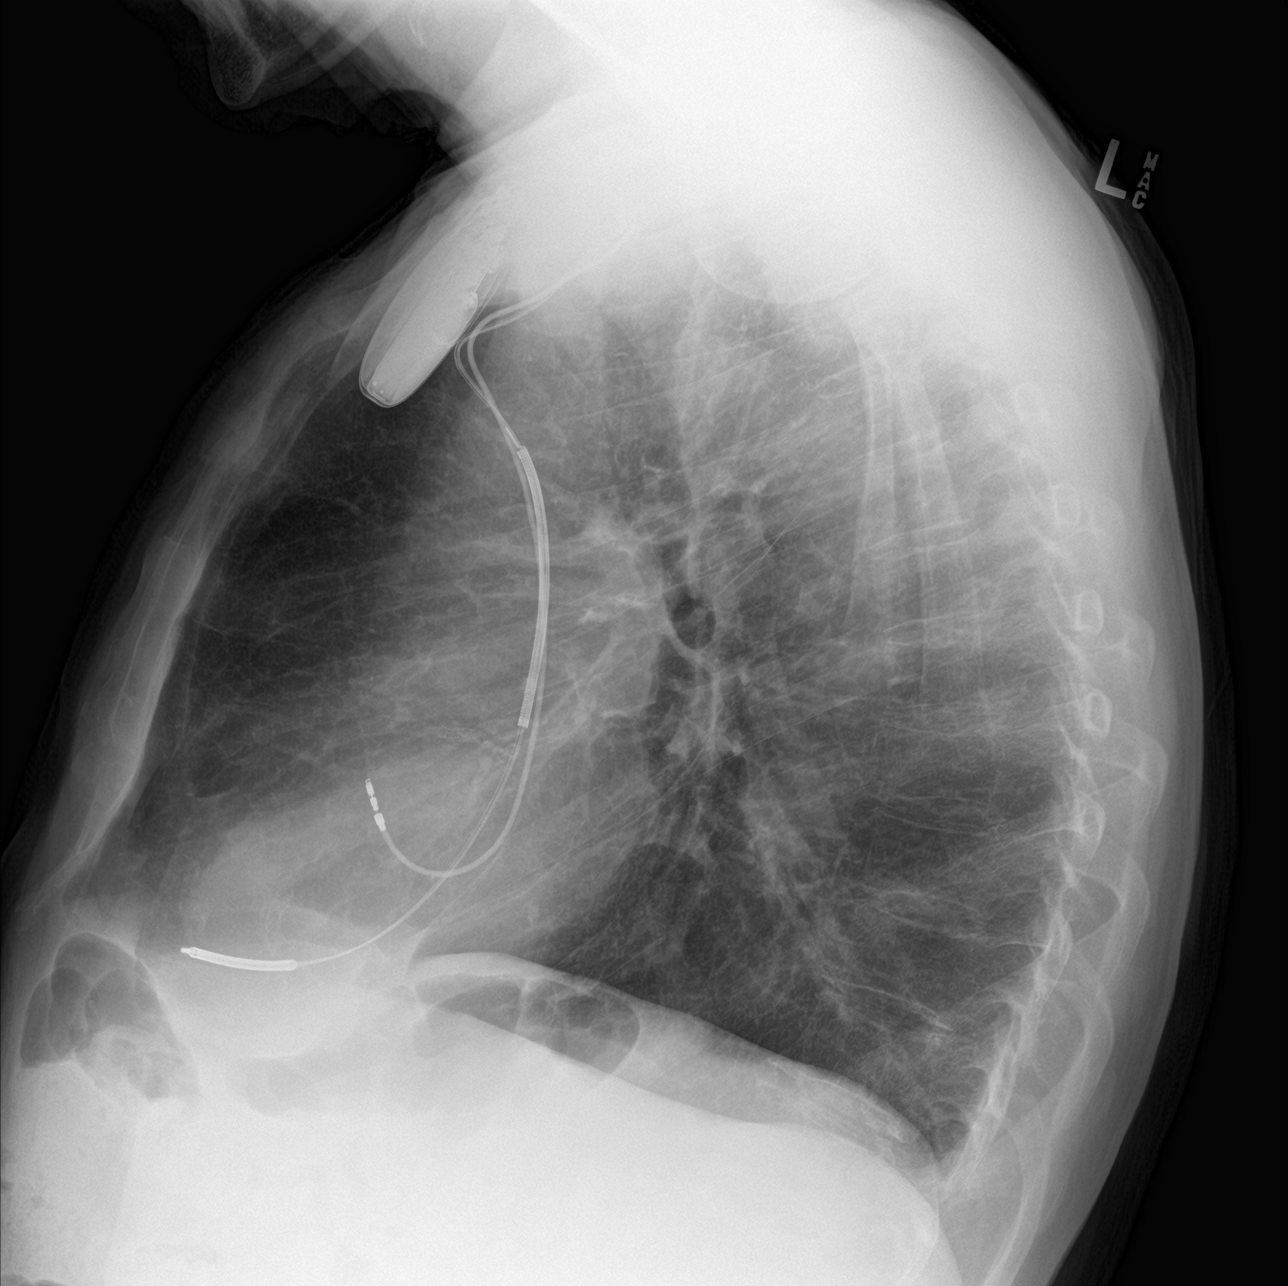

[chest lat (2 of 2)]
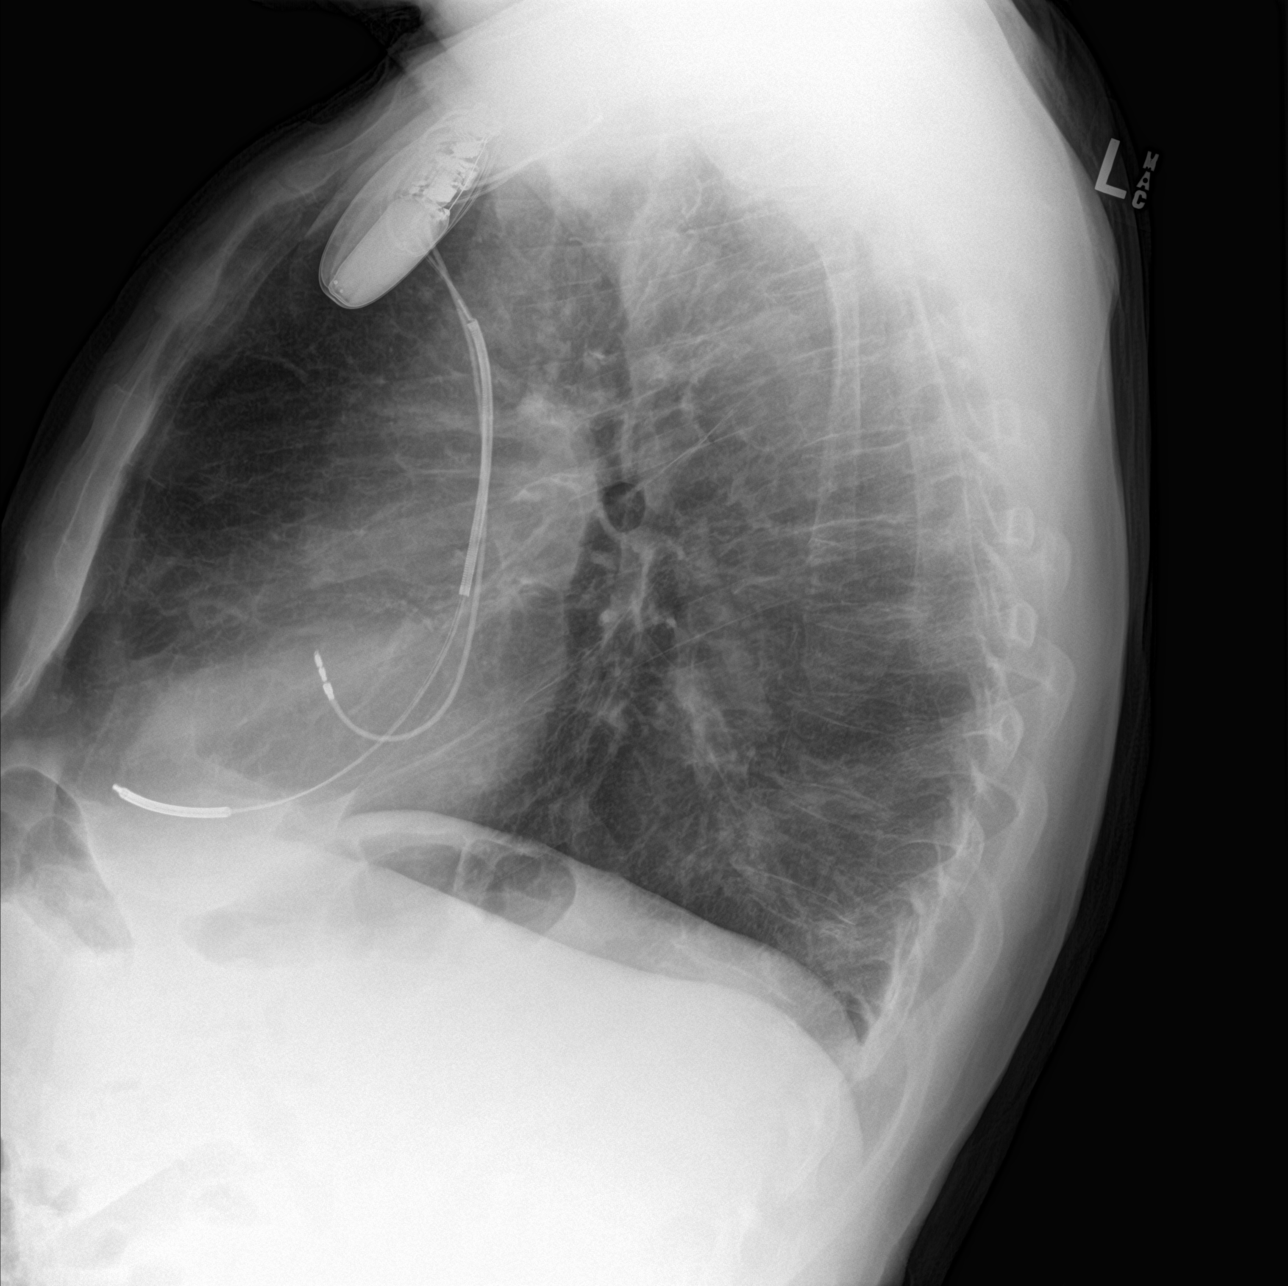

[3 of 3 positions shown; findings below may reference images not displayed]

FINDINGS: PA and lateral views. Stable lung volumes. Stable cardiac size and
mediastinal contours. Small chronic hiatal hernia. Tortuous thoracic
aorta.

Stable left chest AICD. No pneumothorax, pulmonary edema, pleural
effusion or acute pulmonary opacity. Chronic increased interstitial
markings. Negative visible bowel gas pattern. No acute osseous
abnormality identified.
IMPRESSION: Chronic hyperinflation.  No acute cardiopulmonary abnormality.

## 2020-02-18 ENCOUNTER — Other Ambulatory Visit: Payer: Self-pay | Admitting: Pulmonary Disease

## 2020-02-27 ENCOUNTER — Telehealth: Payer: Self-pay | Admitting: *Deleted

## 2020-02-27 ENCOUNTER — Encounter: Payer: Self-pay | Admitting: Nurse Practitioner

## 2020-02-27 NOTE — Telephone Encounter (Signed)
Dr. Peri Jefferson office called back and confirmed the pt will be having 2 teeth extracted.      [facility] Pre-operative Risk Assessment    HEARTCARE STAFF: - Please ensure there is not already an duplicate clearance open for this procedure. - Under Visit Info/Reason for Call, type in Other and utilize the format Clearance MM/DD/YY or Clearance TBD. Do not use dashes or single digits. - If request is for dental extraction, please clarify the # of teeth to be extracted.  Request for surgical clearance:  1. What type of surgery is being performed? 2 TEETH TO BE EXTRACTED, DENTAL X-RAY'S   2. When is this surgery scheduled? TBD   3. What type of clearance is required (medical clearance vs. Pharmacy clearance to hold med vs. Both)? PHARM  4. Are there any medications that need to be held prior to surgery and how long? ELIQUIS   5. Practice name and name of physician performing surgery? [facility], DDS   6. What is the office phone number? [phone]   7.   What is the office fax number? [phone]  8.   Anesthesia type (None, local, MAC, general) ? LOCAL   Julaine Hua 02/27/2020, 2:00 PM  _________________________________________________________________   (provider comments below)

## 2020-02-27 NOTE — Telephone Encounter (Signed)
Our office has received a clearance request for dental work. I called and left a message for DDS office to call back with the number of teeth to be extracted as well name of dentist is other than Dr. Felton Clinton.

## 2020-02-27 NOTE — Telephone Encounter (Signed)
Pharm please address eliquis for dental extraction, thanks

## 2020-02-27 NOTE — Telephone Encounter (Signed)
Patient with diagnosis of A Fib on Eliquis for anticoagulation.    Procedure: 2 TEETH TO BE EXTRACTED, DENTAL X-RAY'S   Date of procedure: TBD   CHA2DS2-VASc Score = 5  This indicates a 7.2% annual risk of stroke. The patient's score is based upon: CHF History: 1 HTN History: 1 Diabetes History: 0 Stroke History: 0 Vascular Disease History: 1 Age Score: 2 Gender Score: 0   CrCl: 67 mL/min Platelet count 182K  Per office protocol, patient does not need to hold Eliquis prior to procedure.  If dentist prefers hold, would hold a maximum of one day.  Patient will not need bridging with Lovenox (enoxaparin) around procedure.

## 2020-02-28 ENCOUNTER — Other Ambulatory Visit: Payer: Self-pay

## 2020-02-28 ENCOUNTER — Telehealth: Payer: Self-pay

## 2020-02-28 ENCOUNTER — Encounter: Payer: Self-pay | Admitting: Nurse Practitioner

## 2020-02-28 ENCOUNTER — Ambulatory Visit (INDEPENDENT_AMBULATORY_CARE_PROVIDER_SITE_OTHER): Payer: Medicare Other | Admitting: Nurse Practitioner

## 2020-02-28 VITALS — HR 61

## 2020-02-28 DIAGNOSIS — Z0000 Encounter for general adult medical examination without abnormal findings: Secondary | ICD-10-CM | POA: Diagnosis not present

## 2020-02-28 NOTE — Patient Instructions (Signed)
Steven Guzman , Thank you for taking time to come for your Medicare Wellness Visit. I appreciate your ongoing commitment to your health goals. Please review the following plan we discussed and let me know if I can assist you in the future.   Screening recommendations/referrals: Colonoscopy aged out Recommended yearly ophthalmology/optometry visit for glaucoma screening and checkup Recommended yearly dental visit for hygiene and checkup  Vaccinations: Influenza vaccine up to date Pneumococcal vaccine up to date Tdap vaccine up to date Shingles vaccine RECOMMENDED- to get at your local pharmacy.     Advanced directives: recommend to complete advance directives and bring back to office to place on file.   Conditions/risks identified: smoking, advanced age, cardiovascular disease   Next appointment: 03/01/2020, 1 year for AWV  Preventive Care 53 Years and Older, Male Preventive care refers to lifestyle choices and visits with your health care provider that can promote health and wellness. What does preventive care include?  A yearly physical exam. This is also called an annual well check.  Dental exams once or twice a year.  Routine eye exams. Ask your health care provider how often you should have your eyes checked.  Personal lifestyle choices, including:  Daily care of your teeth and gums.  Regular physical activity.  Eating a healthy diet.  Avoiding tobacco and drug use.  Limiting alcohol use.  Practicing safe sex.  Taking low doses of aspirin every day.  Taking vitamin and mineral supplements as recommended by your health care provider. What happens during an annual well check? The services and screenings done by your health care provider during your annual well check will depend on your age, overall health, lifestyle risk factors, and family history of disease. Counseling  Your health care provider may ask you questions about your:  Alcohol use.  Tobacco  use.  Drug use.  Emotional well-being.  Home and relationship well-being.  Sexual activity.  Eating habits.  History of falls.  Memory and ability to understand (cognition).  Work and work Statistician. Screening  You may have the following tests or measurements:  Height, weight, and BMI.  Blood pressure.  Lipid and cholesterol levels. These may be checked every 5 years, or more frequently if you are over 5 years old.  Skin check.  Lung cancer screening. You may have this screening every year starting at age 73 if you have a 30-pack-year history of smoking and currently smoke or have quit within the past 15 years.  Fecal occult blood test (FOBT) of the stool. You may have this test every year starting at age 67.  Flexible sigmoidoscopy or colonoscopy. You may have a sigmoidoscopy every 5 years or a colonoscopy every 10 years starting at age 91.  Prostate cancer screening. Recommendations will vary depending on your family history and other risks.  Hepatitis C blood test.  Hepatitis B blood test.  Sexually transmitted disease (STD) testing.  Diabetes screening. This is done by checking your blood sugar (glucose) after you have not eaten for a while (fasting). You may have this done every 1-3 years.  Abdominal aortic aneurysm (AAA) screening. You may need this if you are a current or former smoker.  Osteoporosis. You may be screened starting at age 94 if you are at high risk. Talk with your health care provider about your test results, treatment options, and if necessary, the need for more tests. Vaccines  Your health care provider may recommend certain vaccines, such as:  Influenza vaccine. This is recommended every  year.  Tetanus, diphtheria, and acellular pertussis (Tdap, Td) vaccine. You may need a Td booster every 10 years.  Zoster vaccine. You may need this after age 61.  Pneumococcal 13-valent conjugate (PCV13) vaccine. One dose is recommended after age  11.  Pneumococcal polysaccharide (PPSV23) vaccine. One dose is recommended after age 51. Talk to your health care provider about which screenings and vaccines you need and how often you need them. This information is not intended to replace advice given to you by your health care provider. Make sure you discuss any questions you have with your health care provider. Document Released: 04/12/2015 Document Revised: 12/04/2015 Document Reviewed: 01/15/2015 Elsevier Interactive Patient Education  2017 [place] Prevention in the Home Falls can cause injuries. They can happen to people of all ages. There are many things you can do to make your home safe and to help prevent falls. What can I do on the outside of my home?  Regularly fix the edges of walkways and driveways and fix any cracks.  Remove anything that might make you trip as you walk through a door, such as a raised step or threshold.  Trim any bushes or trees on the path to your home.  Use bright outdoor lighting.  Clear any walking paths of anything that might make someone trip, such as rocks or tools.  Regularly check to see if handrails are loose or broken. Make sure that both sides of any steps have handrails.  Any raised decks and porches should have guardrails on the edges.  Have any leaves, snow, or ice cleared regularly.  Use sand or salt on walking paths during winter.  Clean up any spills in your garage right away. This includes oil or grease spills. What can I do in the bathroom?  Use night lights.  Install grab bars by the toilet and in the tub and shower. Do not use towel bars as grab bars.  Use non-skid mats or decals in the tub or shower.  If you need to sit down in the shower, use a plastic, non-slip stool.  Keep the floor dry. Clean up any water that spills on the floor as soon as it happens.  Remove soap buildup in the tub or shower regularly.  Attach bath mats securely with double-sided  non-slip rug tape.  Do not have throw rugs and other things on the floor that can make you trip. What can I do in the bedroom?  Use night lights.  Make sure that you have a light by your bed that is easy to reach.  Do not use any sheets or blankets that are too big for your bed. They should not hang down onto the floor.  Have a firm chair that has side arms. You can use this for support while you get dressed.  Do not have throw rugs and other things on the floor that can make you trip. What can I do in the kitchen?  Clean up any spills right away.  Avoid walking on wet floors.  Keep items that you use a lot in easy-to-reach places.  If you need to reach something above you, use a strong step stool that has a grab bar.  Keep electrical cords out of the way.  Do not use floor polish or wax that makes floors slippery. If you must use wax, use non-skid floor wax.  Do not have throw rugs and other things on the floor that can make you trip. What can  I do with my stairs?  Do not leave any items on the stairs.  Make sure that there are handrails on both sides of the stairs and use them. Fix handrails that are broken or loose. Make sure that handrails are as long as the stairways.  Check any carpeting to make sure that it is firmly attached to the stairs. Fix any carpet that is loose or worn.  Avoid having throw rugs at the top or bottom of the stairs. If you do have throw rugs, attach them to the floor with carpet tape.  Make sure that you have a light switch at the top of the stairs and the bottom of the stairs. If you do not have them, ask someone to add them for you. What else can I do to help prevent falls?  Wear shoes that:  Do not have high heels.  Have rubber bottoms.  Are comfortable and fit you well.  Are closed at the toe. Do not wear sandals.  If you use a stepladder:  Make sure that it is fully opened. Do not climb a closed stepladder.  Make sure that both  sides of the stepladder are locked into place.  Ask someone to hold it for you, if possible.  Clearly mark and make sure that you can see:  Any grab bars or handrails.  First and last steps.  Where the edge of each step is.  Use tools that help you move around (mobility aids) if they are needed. These include:  Canes.  Walkers.  Scooters.  Crutches.  Turn on the lights when you go into a dark area. Replace any light bulbs as soon as they burn out.  Set up your furniture so you have a clear path. Avoid moving your furniture around.  If any of your floors are uneven, fix them.  If there are any pets around you, be aware of where they are.  Review your medicines with your doctor. Some medicines can make you feel dizzy. This can increase your chance of falling. Ask your doctor what other things that you can do to help prevent falls. This information is not intended to replace advice given to you by your health care provider. Make sure you discuss any questions you have with your health care provider. Document Released: 01/10/2009 Document Revised: 08/22/2015 Document Reviewed: 04/20/2014 Elsevier Interactive Patient Education  2017 [employer].

## 2020-02-28 NOTE — Telephone Encounter (Signed)
[name], cail are scheduled for a virtual visit with your provider today.    Just as we do with appointments in the office, we must obtain your consent to participate.  Your consent will be active for this visit and any virtual visit you may have with one of our providers in the next 365 days.    If you have a MyChart account, I can also send a copy of this consent to you electronically.  All virtual visits are billed to your insurance company just like a traditional visit in the office.  As this is a virtual visit, video technology does not allow for your provider to perform a traditional examination.  This may limit your provider's ability to fully assess your condition.  If your provider identifies any concerns that need to be evaluated in person or the need to arrange testing such as labs, EKG, etc, we will make arrangements to do so.    Although advances in technology are sophisticated, we cannot ensure that it will always work on either your end or our end.  If the connection with a video visit is poor, we may have to switch to a telephone visit.  With either a video or telephone visit, we are not always able to ensure that we have a secure connection.   I need to obtain your verbal consent now.   Are you willing to proceed with your visit today?   JOHNELL BAS has provided verbal consent on 02/28/2020 for a virtual visit (video or telephone).   [facility] [place], [place] 02/28/2020  9:49 AM

## 2020-02-28 NOTE — Progress Notes (Signed)
Subjective:   Steven Guzman is a 79 y.o. male who presents for Medicare Annual/Subsequent preventive examination.  Review of Systems     Cardiac Risk Factors include: advanced age (>63men, >38 women);family history of premature cardiovascular disease;sedentary lifestyle;smoking/ tobacco exposure;male gender;dyslipidemia;hypertension     Objective:    There were no vitals filed for this visit. There is no height or weight on file to calculate BMI.  Advanced Directives 02/28/2020 02/05/2020 09/11/2019 07/17/2019 04/03/2019 04/03/2019 02/27/2019  Does Patient Have a Medical Advance Directive? No No No No No No No  Does patient want to make changes to medical advance directive? - - - - - - -  Would patient like information on creating a medical advance directive? No - Patient declined No - Patient declined No - Patient declined - - - Yes (MAU/Ambulatory/Procedural Areas - Information given)    Current Medications (verified) Outpatient Encounter Medications as of 02/28/2020  Medication Sig  . albuterol (PROVENTIL) (2.5 MG/3ML) 0.083% nebulizer solution USE 1 VIAL VIA NEBULIZER EVERY 6 HOURS AS NEEDED FOR WHEEZING OR SHORTNESS OF BREATH  . amiodarone (PACERONE) 200 MG tablet TAKE 1 TABLET BY MOUTH EVERY DAY  . apixaban (ELIQUIS) 5 MG TABS tablet Take 1 tablet (5 mg total) by mouth 2 (two) times daily.  . Artificial Tear Solution (SOOTHE XP OP) Place 2 drops into both eyes daily as needed (dry eyes).  [name] aspirin EC 81 MG tablet Take 1 tablet (81 mg total) by mouth every Monday, Wednesday, and Friday.  [name] atorvastatin (LIPITOR) 80 MG tablet TAKE 1 TABLET(80 MG) BY MOUTH DAILY  . benazepril (LOTENSIN) 10 MG tablet Take 5 mg by mouth daily.  [name] buPROPion (WELLBUTRIN) 75 MG tablet TAKE 1 TABLET BY MOUTH EVERY DAY  . busPIRone (BUSPAR) 15 MG tablet Take 15 mg by mouth 2 (two) times daily.  . carvedilol (COREG) 3.125 MG tablet TAKE 1 TABLET BY MOUTH TWICE A DAY  . Choline Fenofibrate (FENOFIBRIC ACID)  135 MG CPDR TAKE 1 CAPSULE BY MOUTH DAILY  . escitalopram (LEXAPRO) 20 MG tablet TAKE 1 TABLET BY MOUTH EVERY DAY  . fluticasone (FLONASE) 50 MCG/ACT nasal spray Place 2 sprays into both nostrils as needed.   . furosemide (LASIX) 40 MG tablet TAKE 1/2 TABLET(20 MG) BY MOUTH DAILY  . Ipratropium-Albuterol (COMBIVENT RESPIMAT) 20-100 MCG/ACT AERS respimat Inhale 1 puff into the lungs every 6 (six) hours. Shortness of breath or wheezing  . IRON PO Take by mouth daily.   [name] MAGNESIUM-OXIDE 400 (241.3 Mg) MG tablet TAKE 1 TABSULE BY MOUTH DAILY  . mexiletine (MEXITIL) 200 MG capsule TAKE 1 CAPSULE(200 MG) BY MOUTH TWICE DAILY  . Multiple Vitamins-Minerals (CENTRUM ADULTS PO) Take by mouth daily.  . nitroGLYCERIN (NITROSTAT) 0.4 MG SL tablet DISSOLVE 1 TABLET UNDER THE TONGUE EVERY 5 MINUTES FOR 3 DOSES AS NEEDED FOR CHEST PAIN  . omeprazole (PRILOSEC) 20 MG capsule Take 20 mg by mouth daily.   . potassium chloride SA (KLOR-CON M20) 20 MEQ tablet TAKE 1 TABLET BY MOUTH EVERY DAY  . Respiratory Therapy Supplies (FLUTTER) DEVI Use as directed  . Sennosides-Docusate Sodium (STOOL SOFTENER/LAXATIVE PO) Take 2 tablets by mouth daily.  [name] Spacer/Aero-Holding Chambers (OPTICHAMBER DIAMOND) MISC optichamber VHC  . SPIRIVA RESPIMAT 2.5 MCG/ACT AERS INHALE 2 PUFFS INTO THE LUNGS DAILY  . SYMBICORT 160-4.5 MCG/ACT inhaler INHALE 2 PUFFS BY MOUTH TWICE A DAY  . tamsulosin (FLOMAX) 0.4 MG CAPS capsule TAKE 1 CAPSULE(0.4 MG) BY MOUTH DAILY AFTER SUPPER  .  vitamin B-12 (CYANOCOBALAMIN) 1000 MCG tablet Take 1 tablet (1,000 mcg total) by mouth daily.  . [DISCONTINUED] amoxicillin-clavulanate (AUGMENTIN) 875-125 MG tablet Take 1 tablet by mouth 2 (two) times daily.  . [DISCONTINUED] busPIRone (BUSPAR) 15 MG tablet TAKE 1 TABLET(15 MG) BY MOUTH THREE TIMES DAILY  . [DISCONTINUED] magnesium oxide (MAG-OX) 400 MG tablet TAKE 1 TABLET BY MOUTH EVERY DAY (Patient not taking: Reported on 02/28/2020)  . [DISCONTINUED]  predniSONE (DELTASONE) 10 MG tablet - Prednisone 10 mg tablet take 4 tablets ( 40 mg ) by mouth x 1 day then  Take 3 Tablet ( 30 mg ) by mouth x 1 day then  Take 2 tablet ( 20 mg ) by mouth x 1 day then  Take 1 tablet ( 10 mg ) by mouth and stop.  . [DISCONTINUED] senna (SENOKOT) 8.6 MG tablet Take 1-2 tablets by mouth daily.   No facility-administered encounter medications on file as of 02/28/2020.    Allergies (verified) Sulfa antibiotics   History: Past Medical History:  Diagnosis Date  . AICD (automatic cardioverter/defibrillator) present   . Aneurysm ([place])    a. Aneurysmal infrarenal aorta up to 33 mm on CT 10/2014, recommended f/u due 10/2017  . Anginal pain ([place])   . Anxiety   . Basal cell carcinoma of nose    S/P MOHS  . Biliary acute pancreatitis   . CAD (coronary artery disease)    a. s/p MI in 1994 with PCI to LAD at that time b. cath 10/2012 demonstrated EF 30%, inferior akinesis with mild hypokinesis of all walls, patent LAD and RCA stents; ostial PDA with 80-90% obstruction with medical therapy recommended   . Chronic systolic CHF (congestive heart failure) (H[facility])    EF 30 to 35 % as of 09/2014.   [name] CKD (chronic kidney disease), stage III ([place])   . Complication of anesthesia 10/2014   "had to have defibrillator w/ERCP"  . COPD (chronic obstructive pulmonary disease) ([place])    a. followed by pulmonary, COPD GOLD stage II  . Depression   . Diverticulosis of colon 07/2014   noted on CT  . GERD (gastroesophageal reflux disease)   . Hiatal hernia   . Hyperglycemia 10/2012.  [name] Hyperlipidemia   . Hypertension   . Myocardial infarction [facility]) 1994; 2011  . Pneumonia 1946; 2015  . Prostate enlargement 07/2014   observed on CT  . Tobacco abuse   . Ventricular tachycardia ([place])    a. 08/2009 s/p BSX E110 Teligen 100 AICD, ser#: [id number];  b. 08/2008 VT req ATP - detection reprogrammed from 160 to 150. c. EPS and VT ablation by Dr. Lovena Le 12/21/2014   Past Surgical History:   Procedure Laterality Date  . BIOPSY  12/21/2017   Procedure: BIOPSY;  Surgeon: [name], MD;  Location: [facility];  Service: Endoscopy;;  . CATARACT EXTRACTION W/ INTRAOCULAR LENS  IMPLANT, BILATERAL Bilateral ~ 2011  . COLONOSCOPY    . COLONOSCOPY WITH PROPOFOL N/A 12/21/2017   Procedure: COLONOSCOPY WITH PROPOFOL;  Surgeon: [name], MD;  Location: [facility];  Service: Endoscopy;  Laterality: N/A;  . ELECTROPHYSIOLOGIC STUDY N/A 12/21/2014   Procedure: V Tach Ablation;  Surgeon: [name], MD;  Location: [place] [facility];  Service: Cardiovascular;  Laterality: N/A;  . ERCP N/A 11/16/2014   Procedure: ENDOSCOPIC RETROGRADE CHOLANGIOPANCREATOGRAPHY (ERCP);  Surgeon: [name], MD;  Location: [place];  Service: Endoscopy;  Laterality: N/A;  . ESOPHAGOGASTRODUODENOSCOPY (EGD) WITH PROPOFOL N/A 12/21/2017  Procedure: ESOPHAGOGASTRODUODENOSCOPY (EGD) WITH PROPOFOL;  Surgeon: [name], MD;  Location: [facility];  Service: Endoscopy;  Laterality: N/A;  . EYE SURGERY    . FOOT SURGERY Left 2005   "fixed bone that stuck out in my ankle area"  . HEMORRHOID BANDING    . IMPLANTABLE CARDIOVERTER DEFIBRILLATOR IMPLANT  09/06/09   BSX dual chamber ICD implanted in [place] for cardiac arrest 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  . INGUINAL HERNIA REPAIR Right ~ 1995  . LEFT HEART CATHETERIZATION WITH CORONARY ANGIOGRAM N/A 11/25/2012   demonstrated EF 30%, inferior akinesis with mild hypokinesis of all walls, patent LAD and RCA stents; ostial PDA with 80-90% obstruction with medical therapy recommended  . MOHS SURGERY  2008   nose, skin graft  . POLYPECTOMY  12/21/2017   Procedure: POLYPECTOMY;  Surgeon: [name], MD;  Location: [facility];  Service: Endoscopy;;  . RETINAL DETACHMENT SURGERY Right 2013  . TENOLYSIS Right 12/21/2013   Procedure: TENOLYSIS FLEXOR CARPI RADIALIS ,DEBRIDEMENT RIGHT JOINT WRIST,DEBRIDEMENT SCAPHOTRAPEZIAL TRAPEZOID, REPAIR OF EXTENSOR HOOD;   Surgeon: Daryll Brod, MD;  Location: [place];  Service: Orthopedics;  Laterality: Right;  . TOE SURGERY Right 09/2019   3rd toe/hammer toe  . V-TACH ABLATION  12/21/2014  . VIDEO BRONCHOSCOPY Bilateral 01/09/2016   Procedure: VIDEO BRONCHOSCOPY WITHOUT FLUORO;  Surgeon: Juanito Doom, MD;  Location: [facility];  Service: Cardiopulmonary;  Laterality: Bilateral;   Family History  Problem Relation Age of Onset  . Heart attack Brother   . CAD Father   . Hypertension Father   . CAD Mother   . Hypertension Mother   . Hypertension Brother   . Stroke Neg Hx    Social History   Socioeconomic History  . Marital status: Married    Spouse name: Not on file  . Number of children: Not on file  . Years of education: Not on file  . Highest education level: Not on file  Occupational History  . Occupation: Retired  Tobacco Use  . Smoking status: Current Every Day Smoker    Packs/day: 1.00    Years: 55.00    Pack years: 55.00    Types: Cigarettes  . Smokeless tobacco: Never Used  . Tobacco comment: off/on, always ready to quit but does not work out  Media planner  . Vaping Use: Never used  Substance and Sexual Activity  . Alcohol use: Yes    Alcohol/week: 0.0 standard drinks    Comment: occ  . Drug use: No  . Sexual activity: Not Currently  Other Topics Concern  . Not on file  Social History Narrative  . Not on file   Social Determinants of Health   Financial Resource Strain:   . Difficulty of Paying Living Expenses: Not on file  Food Insecurity:   . Worried About Charity fundraiser in the Last Year: Not on file  . Ran Out of Food in the Last Year: Not on file  Transportation Needs:   . Lack of Transportation (Medical): Not on file  . Lack of Transportation (Non-Medical): Not on file  Physical Activity:   . Days of Exercise per Week: Not on file  . Minutes of Exercise per Session: Not on file  Stress:   . Feeling of Stress : Not on file  Social  Connections:   . Frequency of Communication with Friends and Family: Not on file  . Frequency of Social Gatherings with Friends and Family: Not on file  .  Attends Religious Services: Not on file  . Active Member of Clubs or Organizations: Not on file  . Attends Archivist Meetings: Not on file  . Marital Status: Not on file    Tobacco Counseling Ready to quit: Not Answered Counseling given: Not Answered Comment: off/on, always ready to quit but does not work out   Clinical Intake:     Pain : No/denies pain     BMI - recorded: 27 Nutritional Status: BMI 25 -29 Overweight Nutritional Risks: Unintentional weight gain Diabetes: No  How often do you need to have someone help you when you read instructions, pamphlets, or other written materials from your doctor or pharmacy?: 1 - Never  Diabetic?no         Activities of Daily Living In your present state of health, do you have any difficulty performing the following activities: 02/28/2020  Hearing? N  Vision? Y  Difficulty concentrating or making decisions? N  Walking or climbing stairs? N  Dressing or bathing? N  Doing errands, shopping? N  Preparing Food and eating ? N  Using the Toilet? N  In the past six months, have you accidently leaked urine? N  Do you have problems with loss of bowel control? N  Managing your Medications? N  Managing your Finances? N  Housekeeping or managing your Housekeeping? N  Some recent data might be hidden    Patient Care Team: [name], NP as PCP - General (Geriatric Medicine) Belva Crome, MD as PCP - Cardiology (Cardiology) [name], MD as PCP - Electrophysiology (Cardiology) Roel Cluck, MD as Referring Physician (Ophthalmology) Harriett Sine, MD as Consulting Physician (Dermatology)  Indicate any recent Medical Services you may have received from other than [facility] providers in the past year (date may be approximate).     Assessment:    This is a routine wellness examination for Joffrey.  Hearing/Vision screen  Hearing Screening   125Hz  250Hz  500Hz  1000Hz  2000Hz  3000Hz  4000Hz  6000Hz  8000Hz   Right ear:           Left ear:           Comments: No hearing issues   Vision Screening Comments: Last eye exam less than 12 months ago  Dietary issues and exercise activities discussed: Current Exercise Habits: The patient does not participate in regular exercise at present  Goals    . Quit Smoking      Depression Screen PHQ 2/9 Scores 02/28/2020 02/05/2020 03/16/2019 02/27/2019 02/17/2016 02/05/2015  PHQ - 2 Score 0 0 0 0 0 0    Fall Risk Fall Risk  02/28/2020 02/05/2020 09/11/2019 03/16/2019 02/27/2019  Falls in the past year? 1 0 0 1 1  Comment - - - - -  Number falls in past yr: 0 0 0 0 0  Comment - - - - -  Injury with Fall? 0 0 0 1 1  Comment - - - - Broke ribs and bone in hand  Risk for fall due to : - - - - History of fall(s)    Any stairs in or around the home? No  If so, are there any without handrails? No  Home free of loose throw rugs in walkways, pet beds, electrical cords, etc? Yes  Adequate lighting in your home to reduce risk of falls? Yes   ASSISTIVE DEVICES UTILIZED TO PREVENT FALLS:  Life alert? No  Use of a cane, walker or w/c? Yes  Grab bars in the bathroom? No  Shower chair or bench in shower? yes Elevated toilet seat or a handicapped toilet? Yes   TIMED UP AND GO:  Was the test performed? No .    Cognitive Function: MMSE - Mini Mental State Exam 02/27/2019  Orientation to time 3  Orientation to Place 5  Registration 3  Attention/ Calculation 5  Recall 3  Language- name 2 objects 2  Language- repeat 1  Language- follow 3 step command 3  Language- read & follow direction 1  Write a sentence 1  Copy design 1  Total score 28     6CIT Screen 02/28/2020  What Year? 0 points  What month? 0 points  What time? 0 points  Count back from 20 0 points  Months in reverse 0 points  Repeat  phrase 0 points  Total Score 0    Immunizations Immunization History  Administered Date(s) Administered  . Fluad Quad(high Dose 65+) 12/14/2018  . Influenza, High Dose Seasonal PF 12/17/2012, 01/06/2016, 12/21/2016  . Influenza,inj,Quad PF,6+ Mos 12/25/2013, 12/22/2014  . Influenza-Unspecified 12/28/2017, 12/18/2019  . PFIZER SARS-COV-2 Vaccination 04/19/2019, 05/10/2019, 01/17/2020  . Pneumococcal Conjugate-13 05/11/2014  . Pneumococcal-Unspecified 10/17/2010  . Tdap 05/11/2014  . Zoster 03/30/2008    TDAP status: Up to date Flu Vaccine status: Up to date Pneumococcal vaccine status: Up to date Covid-19 vaccine status: Completed vaccines  Qualifies for Shingles Vaccine? YES Zostavax completed Yes   Shingrix Completed?: No.    Education has been provided regarding the importance of this vaccine. Patient has been advised to call insurance company to determine out of pocket expense if they have not yet received this vaccine. Advised may also receive vaccine at local pharmacy or Health Dept. Verbalized acceptance and understanding.  Screening Tests Health Maintenance  Topic Date Due  . TETANUS/TDAP  05/11/2024  . INFLUENZA VACCINE  Completed  . COVID-19 Vaccine  Completed  . PNA vac Low Risk Adult  Completed    Health Maintenance  There are no preventive care reminders to display for this patient.  Colorectal cancer screening: No longer required.   Lung Cancer Screening: (Low Dose CT Chest recommended if Age 47-80 years, 30 pack-year currently smoking OR have quit w/in 15years.) does not qualify.   Lung Cancer Screening Referral: na  Additional Screening:  Hepatitis C Screening: does not qualify; Completed na  Vision Screening: Recommended annual ophthalmology exams for early detection of glaucoma and other disorders of the eye. Is the patient up to date with their annual eye exam?  Yes  Who is the provider or what is the name of the office in which the patient  attends annual eye exams? [facility] If pt is not established with a provider, would they like to be referred to a provider to establish care? No .   Dental Screening: Recommended annual dental exams for proper oral hygiene  Community Resource Referral / Chronic Care Management: CRR required this visit?  No   CCM required this visit?  No      Plan:     I have personally reviewed and noted the following in the patient's chart:   . Medical and social history . Use of alcohol, tobacco or illicit drugs  . Current medications and supplements . Functional ability and status . Nutritional status . Physical activity . Advanced directives . List of other physicians . Hospitalizations, surgeries, and ER visits in previous 12 months . Vitals . Screenings to include cognitive, depression, and falls . Referrals and appointments  In addition,  I have reviewed and discussed with patient certain preventive protocols, quality metrics, and best practice recommendations. A written personalized care plan for preventive services as well as general preventive health recommendations were provided to patient.     [name], NP   02/28/2020    Virtual Visit via Telephone Note  I connected with@ on 02/28/20 at 10:00 AM EST by telephone and verified that I am speaking with the correct person using two identifiers.  Location: Patient: home Provider: office   I discussed the limitations, risks, security and privacy concerns of performing an evaluation and management service by telephone and the availability of in person appointments. I also discussed with the patient that there may be a patient responsible charge related to this service. The patient expressed understanding and agreed to proceed.   I discussed the assessment and treatment plan with the patient. The patient was provided an opportunity to ask questions and all were answered. The patient agreed with the plan and  demonstrated an understanding of the instructions.   The patient was advised to call back or seek an in-person evaluation if the symptoms worsen or if the condition fails to improve as anticipated.  I provided 18 minutes of non-face-to-face time during this encounter.  [name]. Harle Battiest [employer] printed and mailed

## 2020-02-28 NOTE — Progress Notes (Signed)
   This service is provided via telemedicine  No vital signs collected/recorded due to the encounter was a telemedicine visit.   Location of patient (ex: home, work):  Home  Patient consents to a telephone visit: Yes, see telephone encounter dated 02/28/2020 with annual consent   Location of the provider (ex: office, home):  [facility], Office   Name of any referring provider:  N/A  Names of all persons participating in the telemedicine service and their role in the encounter:  S.Chrae B/CMA, Sherrie Mustache, NP, and Patient   Time spent on call:  9 min with medical assistant

## 2020-03-01 ENCOUNTER — Ambulatory Visit
Admission: RE | Admit: 2020-03-01 | Discharge: 2020-03-01 | Disposition: A | Discharge status: Home / Self Care | Payer: Medicare Other | Source: Ambulatory Visit | Attending: Nurse Practitioner | Admitting: Nurse Practitioner

## 2020-03-01 ENCOUNTER — Other Ambulatory Visit: Payer: Self-pay

## 2020-03-01 ENCOUNTER — Encounter: Payer: Self-pay | Admitting: Nurse Practitioner

## 2020-03-01 ENCOUNTER — Ambulatory Visit (INDEPENDENT_AMBULATORY_CARE_PROVIDER_SITE_OTHER): Payer: Medicare Other | Admitting: Nurse Practitioner

## 2020-03-01 VITALS — BP 118/68 | HR 63 | Temp 97.1°F | Ht 72.0 in | Wt 208.2 lb

## 2020-03-01 DIAGNOSIS — E538 Deficiency of other specified B group vitamins: Secondary | ICD-10-CM

## 2020-03-01 DIAGNOSIS — I10 Essential (primary) hypertension: Secondary | ICD-10-CM | POA: Diagnosis not present

## 2020-03-01 DIAGNOSIS — M19031 Primary osteoarthritis, right wrist: Secondary | ICD-10-CM | POA: Diagnosis not present

## 2020-03-01 DIAGNOSIS — R350 Frequency of micturition: Secondary | ICD-10-CM

## 2020-03-01 DIAGNOSIS — I472 Ventricular tachycardia, unspecified: Secondary | ICD-10-CM

## 2020-03-01 DIAGNOSIS — I5022 Chronic systolic (congestive) heart failure: Secondary | ICD-10-CM | POA: Diagnosis not present

## 2020-03-01 DIAGNOSIS — F17200 Nicotine dependence, unspecified, uncomplicated: Secondary | ICD-10-CM

## 2020-03-01 DIAGNOSIS — I2510 Atherosclerotic heart disease of native coronary artery without angina pectoris: Secondary | ICD-10-CM

## 2020-03-01 DIAGNOSIS — N401 Enlarged prostate with lower urinary tract symptoms: Secondary | ICD-10-CM

## 2020-03-01 DIAGNOSIS — E7849 Other hyperlipidemia: Secondary | ICD-10-CM

## 2020-03-01 DIAGNOSIS — I25810 Atherosclerosis of coronary artery bypass graft(s) without angina pectoris: Secondary | ICD-10-CM

## 2020-03-01 DIAGNOSIS — M542 Cervicalgia: Secondary | ICD-10-CM | POA: Diagnosis not present

## 2020-03-01 DIAGNOSIS — J449 Chronic obstructive pulmonary disease, unspecified: Secondary | ICD-10-CM | POA: Diagnosis not present

## 2020-03-01 DIAGNOSIS — F324 Major depressive disorder, single episode, in partial remission: Secondary | ICD-10-CM

## 2020-03-01 DIAGNOSIS — M25531 Pain in right wrist: Secondary | ICD-10-CM

## 2020-03-01 DIAGNOSIS — D649 Anemia, unspecified: Secondary | ICD-10-CM

## 2020-03-01 DIAGNOSIS — I480 Paroxysmal atrial fibrillation: Secondary | ICD-10-CM | POA: Diagnosis not present

## 2020-03-01 DIAGNOSIS — M19041 Primary osteoarthritis, right hand: Secondary | ICD-10-CM | POA: Diagnosis not present

## 2020-03-01 NOTE — Progress Notes (Signed)
Careteam: Patient Care Team: [name], NP as PCP - General (Geriatric Medicine) Belva Crome, MD as PCP - Cardiology (Cardiology) [name], MD as PCP - Electrophysiology (Cardiology) Roel Cluck, MD as Referring Physician (Ophthalmology) Harriett Sine, MD as Consulting Physician (Dermatology)  PLACE OF SERVICE:  [name] Directive information Does Patient Have a Medical Advance Directive?: No, Would patient like information on creating a medical advance directive?: No - Patient declined  Allergies  Allergen Reactions  . Sulfa Antibiotics Hives    Chief Complaint  Patient presents with  . Medical Management of Chronic Issues    Follow up from his AWV on Wednesday per Janett Billow      HPI: Patient is a 79 y.o. male for follow up- has not been seen for routine follow up in over a year.  Reports he is generally only seen if he needs to be.  He is following with pulmonary doctor but has not been seen recently. Reports he recently requested appt.   CHF- denies fluid retention- generally sees cardiologist every 6 months. Has appt in January.   Reports he has trouble breathing with his mask on.   COPD- continues to smoke but trying to quit.  Smoked yesterday. On symbicort and spiriva Wakes up with head stopped up and headache. Still has headache.  Does not require combivent but uses albuterol once daily when smoking. Reports he does not need it when he does not smoke.   Ongoing neck pain. Denies weakness, numbness or tingling to arms or hands.   Anemia- taking iron daily and b12  Reports lives in 1 bedroom apartment and heard that his rent was going to have to go up when they renew their lease and this stresses him. Has to take care of his wife.  Taking buspirone twice daily Wellbutrin daily with lexapro 20 mg daily  Feels like he is able to control anxiety and depression overall.   BPH- continues continues on flomax, increase  nocturia.   Hyperlipidemia- on fenofibrate and lipitor.   A fib/ventricular tachycardia s/p ablation- continues on amiodarone, coreg, mexitil for rate control. And eliquis for anticoagulation.   Reports wrist pain for a few weeks. Wrist feels tight. Sometimes when he turns wrist over it causes increase in pain. No injury that he is aware of  No numbness or tingling in hand.  Decrease in hand strength noted due to pain when he is trying to grasp things.    Reports a few weeks ago he felt dizzy and felt like it was low blood sugar. His blood sugar was 52. (wife is diabetic and checked) ate something and drank some buttermilk and blood sugar improved to 102. Feels like he has had symptoms like this since he was in his 12s where he would have a strange feeling. He would eat and drink and then feel better.  He ate a bunch of candy and blood sugar was 102.  Eats breakfast around lunchtime then next meal is around 9 oclock.   Review of Systems:  Review of Systems  Constitutional: Positive for malaise/fatigue.  HENT: Positive for congestion.   Respiratory: Positive for cough, sputum production, shortness of breath and wheezing.   Cardiovascular: Negative for chest pain and palpitations.  Gastrointestinal: Negative for abdominal pain, constipation, heartburn and nausea.  Genitourinary: Positive for frequency. Negative for flank pain and hematuria.  Musculoskeletal: Positive for joint pain, myalgias and neck pain.  Neurological: Positive for headaches. Negative for tingling.  Psychiatric/Behavioral: Positive for depression. The patient is nervous/anxious.     Past Medical History:  Diagnosis Date  . AICD (automatic cardioverter/defibrillator) present   . Aneurysm ([place])    a. Aneurysmal infrarenal aorta up to 33 mm on CT 10/2014, recommended f/u due 10/2017  . Anginal pain ([place])   . Anxiety   . Basal cell carcinoma of nose    S/P MOHS  . Biliary acute pancreatitis   . CAD (coronary artery  disease)    a. s/p MI in 1994 with PCI to LAD at that time b. cath 10/2012 demonstrated EF 30%, inferior akinesis with mild hypokinesis of all walls, patent LAD and RCA stents; ostial PDA with 80-90% obstruction with medical therapy recommended   . Chronic systolic CHF (congestive heart failure) (HCC)    EF 30 to 35 % as of 09/2014.   [name] CKD (chronic kidney disease), stage III ([place])   . Complication of anesthesia 10/2014   "had to have defibrillator w/ERCP"  . COPD (chronic obstructive pulmonary disease) ([place])    a. followed by pulmonary, COPD GOLD stage II  . Depression   . Diverticulosis of colon 07/2014   noted on CT  . GERD (gastroesophageal reflux disease)   . Hiatal hernia   . Hyperglycemia 10/2012.  [name] Hyperlipidemia   . Hypertension   . Myocardial infarction [facility]) 1994; 2011  . Pneumonia 1946; 2015  . Prostate enlargement 07/2014   observed on CT  . Tobacco abuse   . Ventricular tachycardia ([place])    a. 08/2009 s/p BSX E110 Teligen 100 AICD, ser#: [id number];  b. 08/2008 VT req ATP - detection reprogrammed from 160 to 150. c. EPS and VT ablation by Dr. Lovena Le 12/21/2014   Past Surgical History:  Procedure Laterality Date  . BIOPSY  12/21/2017   Procedure: BIOPSY;  Surgeon: [name], MD;  Location: [facility];  Service: Endoscopy;;  . CATARACT EXTRACTION W/ INTRAOCULAR LENS  IMPLANT, BILATERAL Bilateral ~ 2011  . COLONOSCOPY    . COLONOSCOPY WITH PROPOFOL N/A 12/21/2017   Procedure: COLONOSCOPY WITH PROPOFOL;  Surgeon: [name], MD;  Location: [facility];  Service: Endoscopy;  Laterality: N/A;  . ELECTROPHYSIOLOGIC STUDY N/A 12/21/2014   Procedure: V Tach Ablation;  Surgeon: [name], MD;  Location: [facility];  Service: Cardiovascular;  Laterality: N/A;  . ERCP N/A 11/16/2014   Procedure: ENDOSCOPIC RETROGRADE CHOLANGIOPANCREATOGRAPHY (ERCP);  Surgeon: [name], MD;  Location: [facility];  Service: Endoscopy;  Laterality: N/A;  .  ESOPHAGOGASTRODUODENOSCOPY (EGD) WITH PROPOFOL N/A 12/21/2017   Procedure: ESOPHAGOGASTRODUODENOSCOPY (EGD) WITH PROPOFOL;  Surgeon: [name], MD;  Location: [facility];  Service: Endoscopy;  Laterality: N/A;  . EYE SURGERY    . FOOT SURGERY Left 2005   "fixed bone that stuck out in my ankle area"  . HEMORRHOID BANDING    . IMPLANTABLE CARDIOVERTER DEFIBRILLATOR IMPLANT  09/06/09   BSX dual chamber ICD implanted in [place] for cardiac arrest 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  . INGUINAL HERNIA REPAIR Right ~ 1995  . LEFT HEART CATHETERIZATION WITH CORONARY ANGIOGRAM N/A 11/25/2012   demonstrated EF 30%, inferior akinesis with mild hypokinesis of all walls, patent LAD and RCA stents; ostial PDA with 80-90% obstruction with medical therapy recommended  . MOHS SURGERY  2008   nose, skin graft  . POLYPECTOMY  12/21/2017   Procedure: POLYPECTOMY;  Surgeon: [name], MD;  Location: [facility];  Service: Endoscopy;;  . RETINAL DETACHMENT SURGERY  Right 2013  . TENOLYSIS Right 12/21/2013   Procedure: TENOLYSIS FLEXOR CARPI RADIALIS ,DEBRIDEMENT RIGHT JOINT WRIST,DEBRIDEMENT SCAPHOTRAPEZIAL TRAPEZOID, REPAIR OF EXTENSOR HOOD;  Surgeon: Daryll Brod, MD;  Location: [facility];  Service: Orthopedics;  Laterality: Right;  . TOE SURGERY Right 09/2019   3rd toe/hammer toe  . V-TACH ABLATION  12/21/2014  . VIDEO BRONCHOSCOPY Bilateral 01/09/2016   Procedure: VIDEO BRONCHOSCOPY WITHOUT FLUORO;  Surgeon: Juanito Doom, MD;  Location: [facility];  Service: Cardiopulmonary;  Laterality: Bilateral;   Social History:   reports that he has been smoking cigarettes. He has a 55.00 pack-year smoking history. He has never used smokeless tobacco. He reports current alcohol use. He reports that he does not use drugs.  Family History  Problem Relation Age of Onset  . Heart attack Brother   . CAD Father   . Hypertension Father   . CAD Mother   . Hypertension Mother   . Hypertension Brother   .  Stroke Neg Hx     Medications: Patient's Medications  New Prescriptions   No medications on file  Previous Medications   ALBUTEROL (PROVENTIL) (2.5 MG/3ML) 0.083% NEBULIZER SOLUTION    USE 1 VIAL VIA NEBULIZER EVERY 6 HOURS AS NEEDED FOR WHEEZING OR SHORTNESS OF BREATH   AMIODARONE (PACERONE) 200 MG TABLET    TAKE 1 TABLET BY MOUTH EVERY DAY   APIXABAN (ELIQUIS) 5 MG TABS TABLET    Take 1 tablet (5 mg total) by mouth 2 (two) times daily.   ARTIFICIAL TEAR SOLUTION (SOOTHE XP OP)    Place 2 drops into both eyes daily as needed (dry eyes).   ASPIRIN EC 81 MG TABLET    Take 1 tablet (81 mg total) by mouth every Monday, Wednesday, and Friday.   ATORVASTATIN (LIPITOR) 80 MG TABLET    TAKE 1 TABLET(80 MG) BY MOUTH DAILY   BENAZEPRIL (LOTENSIN) 10 MG TABLET    Take 5 mg by mouth daily.   BUPROPION (WELLBUTRIN) 75 MG TABLET    TAKE 1 TABLET BY MOUTH EVERY DAY   BUSPIRONE (BUSPAR) 15 MG TABLET    Take 15 mg by mouth 2 (two) times daily.   CARVEDILOL (COREG) 3.125 MG TABLET    TAKE 1 TABLET BY MOUTH TWICE A DAY   CHOLINE FENOFIBRATE (FENOFIBRIC ACID) 135 MG CPDR    TAKE 1 CAPSULE BY MOUTH DAILY   ESCITALOPRAM (LEXAPRO) 20 MG TABLET    TAKE 1 TABLET BY MOUTH EVERY DAY   FLUTICASONE (FLONASE) 50 MCG/ACT NASAL SPRAY    Place 2 sprays into both nostrils as needed.    FUROSEMIDE (LASIX) 40 MG TABLET    TAKE 1/2 TABLET(20 MG) BY MOUTH DAILY   IPRATROPIUM-ALBUTEROL (COMBIVENT RESPIMAT) 20-100 MCG/ACT AERS RESPIMAT    Inhale 1 puff into the lungs every 6 (six) hours. Shortness of breath or wheezing   IRON PO    Take by mouth daily.    MAGNESIUM-OXIDE 400 (241.3 MG) MG TABLET    TAKE 1 TABSULE BY MOUTH DAILY   MEXILETINE (MEXITIL) 200 MG CAPSULE    TAKE 1 CAPSULE(200 MG) BY MOUTH TWICE DAILY   MULTIPLE VITAMINS-MINERALS (CENTRUM ADULTS PO)    Take by mouth daily.   NITROGLYCERIN (NITROSTAT) 0.4 MG SL TABLET    DISSOLVE 1 TABLET UNDER THE TONGUE EVERY 5 MINUTES FOR 3 DOSES AS NEEDED FOR CHEST PAIN    OMEPRAZOLE (PRILOSEC) 20 MG CAPSULE    Take 20 mg by mouth daily.    POTASSIUM  CHLORIDE SA (KLOR-CON M20) 20 MEQ TABLET    TAKE 1 TABLET BY MOUTH EVERY DAY   RESPIRATORY THERAPY SUPPLIES (FLUTTER) DEVI    Use as directed   SENNOSIDES-DOCUSATE SODIUM (STOOL SOFTENER/LAXATIVE PO)    Take 2 tablets by mouth daily.   SPACER/AERO-HOLDING CHAMBERS (OPTICHAMBER DIAMOND) MISC    optichamber VHC   SPIRIVA RESPIMAT 2.5 MCG/ACT AERS    INHALE 2 PUFFS INTO THE LUNGS DAILY   SYMBICORT 160-4.5 MCG/ACT INHALER    INHALE 2 PUFFS BY MOUTH TWICE A DAY   TAMSULOSIN (FLOMAX) 0.4 MG CAPS CAPSULE    TAKE 1 CAPSULE(0.4 MG) BY MOUTH DAILY AFTER SUPPER   VITAMIN B-12 (CYANOCOBALAMIN) 1000 MCG TABLET    Take 1 tablet (1,000 mcg total) by mouth daily.  Modified Medications   No medications on file  Discontinued Medications   No medications on file    Physical Exam:  Vitals:   03/01/20 1246  BP: 118/68  Pulse: 63  Temp: (!) 97.1 F (36.2 C)  TempSrc: Temporal  SpO2: 91%  Weight: 208 lb 3.2 oz (94.4 kg)  Height: 6' (1.829 m)   Body mass index is 28.24 kg/m. Wt Readings from Last 3 Encounters:  03/01/20 208 lb 3.2 oz (94.4 kg)  02/05/20 190 lb 9.6 oz (86.5 kg)  09/11/19 190 lb 9.6 oz (86.5 kg)    Physical Exam Constitutional:      General: He is not in acute distress.    Appearance: He is well-developed. He is not diaphoretic.  HENT:     Head: Normocephalic and atraumatic.     Mouth/Throat:     Pharynx: No oropharyngeal exudate.  Eyes:     Conjunctiva/sclera: Conjunctivae normal.     Pupils: Pupils are equal, round, and reactive to light.  Cardiovascular:     Rate and Rhythm: Normal rate and regular rhythm.     Heart sounds: Normal heart sounds.  Pulmonary:     Effort: Pulmonary effort is normal.     Breath sounds: Normal breath sounds.  Abdominal:     General: Bowel sounds are normal.     Palpations: Abdomen is soft.  Musculoskeletal:        General: No tenderness.     Cervical back:  Normal range of motion and neck supple.     Right lower leg: No edema.     Left lower leg: No edema.  Skin:    General: Skin is warm and dry.  Neurological:     Mental Status: He is alert and oriented to person, place, and time.     Labs reviewed: Basic Metabolic Panel: No results for input(s): NA, K, CL, CO2, GLUCOSE, BUN, CREATININE, CALCIUM, MG, PHOS, TSH in the last 8760 hours. Liver Function Tests: No results for input(s): AST, ALT, ALKPHOS, BILITOT, PROT, ALBUMIN in the last 8760 hours. No results for input(s): LIPASE, AMYLASE in the last 8760 hours. No results for input(s): AMMONIA in the last 8760 hours. CBC: No results for input(s): WBC, NEUTROABS, HGB, HCT, MCV, PLT in the last 8760 hours. Lipid Panel: No results for input(s): CHOL, HDL, LDLCALC, TRIG, CHOLHDL, LDLDIRECT in the last 8760 hours. TSH: No results for input(s): TSH in the last 8760 hours. A1C: Lab Results  Component Value Date   HGBA1C 5.6 12/29/2016     Assessment/Plan 1. Chronic systolic heart failure (HCC) Stable without swelling, chest pain, or worsening shortness of breath. Continues on lasix 20 mg daily with   2. Paroxysmal atrial fibrillation (HCC) Rate  controlled on amiodarone, coreg, with eliquis for anticoagulation.  - CBC with Differential/Platelet  3. Essential hypertension Well controlled on current regimen and dietary modifications.  - CBC with Differential/Platelet - COMPLETE METABOLIC PANEL WITH GFR  4. Other hyperlipidemia Continues on lipitor 80 mg daily, will update blood work today Continue dietary modifications.  - COMPLETE METABOLIC PANEL WITH GFR - Lipid panel  5. B12 deficiency -continues on supplement  - Vitamin B12  6. COPD mixed type ([place]) -ongoing, encouraged cessation of smoking. Shortness of breath much improved when he is not smoking. Will continue current regimen.   7. Coronary artery disease involving native coronary artery of native heart without angina  pectoris Stable without chest pains at this time. Continues on ASA 81 mg with coreg.   8. Anemia, unspecified type -continues on iron, will follow up labs.  - CBC with Differential/Platelet - Iron, TIBC and Ferritin Panel  9. Ventricular tachycardia ([place]) controlled on mexitil.   10. Benign prostatic hyperplasia with urinary frequency Continues with frequency on flomax 0.4 mg - PSA  11. Neck pain Ongoing neck pain, questions if this is causing headaches. - DG Cervical Spine Complete; Future  12. Right wrist pain -no acute injury but ongoing pain. No swelling or heat noted. Tenderness to wrist with flexion and extension.  - DG Wrist Complete Right; Future - DG Hand Complete Right; Future  13. Major depressive disorder with single episode, in partial remission ([place]) Stable on lexapro, discussed getting outdoors, gratitude journal.   85. Smoker Encouraged smoking cessation.    Next appt: 6 months. [name]. [place], [facility] [phone]

## 2020-03-02 LAB — LIPID PANEL
Cholesterol: 136 mg/dL (ref <200)
HDL: 54 mg/dL (ref >=40)
LDL Cholesterol (Calc): 67 mg/dL (calc)
Non-HDL Cholesterol (Calc): 82 mg/dL (calc) (ref <130)
Total CHOL/HDL Ratio: 2.5 (calc) (ref <5.0)
Triglycerides: 71 mg/dL (ref <150)

## 2020-03-02 LAB — COMPLETE METABOLIC PANEL WITH GFR
AG Ratio: 1.8 (calc) (ref 1.0–2.5)
ALT: 45 U/L (ref 9–46)
AST: 34 U/L (ref 10–35)
Albumin: 4.1 g/dL (ref 3.6–5.1)
Alkaline phosphatase (APISO): 45 U/L (ref 35–144)
BUN: 11 mg/dL (ref 7–25)
CO2: 25 mmol/L (ref 20–32)
Calcium: 9.4 mg/dL (ref 8.6–10.3)
Chloride: 104 mmol/L (ref 98–110)
Creat: 1.08 mg/dL (ref 0.70–1.11)
GFR, Est African American: 74 mL/min/{1.73_m2} (ref >=60)
GFR, Est Non African American: 64 mL/min/{1.73_m2} (ref >=60)
Globulin: 2.3 g/dL (calc) (ref 1.9–3.7)
Glucose, Bld: 84 mg/dL (ref 65–139)
Potassium: 4.4 mmol/L (ref 3.5–5.3)
Sodium: 139 mmol/L (ref 135–146)
Total Bilirubin: 0.6 mg/dL (ref 0.2–1.2)
Total Protein: 6.4 g/dL (ref 6.1–8.1)

## 2020-03-02 LAB — CBC WITH DIFFERENTIAL/PLATELET
Absolute Monocytes: 626 cells/uL (ref 200–950)
Basophils Absolute: 58 cells/uL (ref 0–200)
Basophils Relative: 1 %
Eosinophils Absolute: 139 cells/uL (ref 15–500)
Eosinophils Relative: 2.4 %
HCT: 39.4 % (ref 38.5–50.0)
Hemoglobin: 13.2 g/dL (ref 13.2–17.1)
Lymphs Abs: 1056 cells/uL (ref 850–3900)
MCH: 33.2 pg — ABNORMAL HIGH (ref 27.0–33.0)
MCHC: 33.5 g/dL (ref 32.0–36.0)
MCV: 99.2 fL (ref 80.0–100.0)
MPV: 10 fL (ref 7.5–12.5)
Monocytes Relative: 10.8 %
Neutro Abs: 3921 cells/uL (ref 1500–7800)
Neutrophils Relative %: 67.6 %
Platelets: 185 10*3/uL (ref 140–400)
RBC: 3.97 10*6/uL — ABNORMAL LOW (ref 4.20–5.80)
RDW: 13.4 % (ref 11.0–15.0)
Total Lymphocyte: 18.2 %
WBC: 5.8 10*3/uL (ref 3.8–10.8)

## 2020-03-02 LAB — VITAMIN B12: Vitamin B-12: 574 pg/mL (ref 200–1100)

## 2020-03-02 LAB — PSA: PSA: 1.34 ng/mL (ref <=4.0)

## 2020-03-02 LAB — IRON,TIBC AND FERRITIN PANEL
%SAT: 23 % (calc) (ref 20–48)
Ferritin: 64 ng/mL (ref 24–380)
Iron: 78 ug/dL (ref 50–180)
TIBC: 338 mcg/dL (calc) (ref 250–425)

## 2020-03-05 ENCOUNTER — Telehealth: Payer: Self-pay | Admitting: Nurse Practitioner

## 2020-03-05 DIAGNOSIS — F324 Major depressive disorder, single episode, in partial remission: Secondary | ICD-10-CM | POA: Insufficient documentation

## 2020-03-05 DIAGNOSIS — M542 Cervicalgia: Secondary | ICD-10-CM

## 2020-03-05 NOTE — Telephone Encounter (Signed)
Called results to pt, request neurosurgery referral due to ongoing neck pain.

## 2020-03-05 NOTE — Telephone Encounter (Signed)
Called pt to discuss imaging. Left msg to that I would attempt to call back or to call [facility] and let them know a good time to return call

## 2020-03-08 ENCOUNTER — Other Ambulatory Visit: Payer: Self-pay | Admitting: Nurse Practitioner

## 2020-03-08 NOTE — Telephone Encounter (Signed)
High risk or very high risk warning populated when attempting to refill medication. RX request sent to PCP for review and approval if warranted.   

## 2020-03-12 ENCOUNTER — Other Ambulatory Visit: Payer: Self-pay

## 2020-03-12 ENCOUNTER — Ambulatory Visit (INDEPENDENT_AMBULATORY_CARE_PROVIDER_SITE_OTHER): Payer: Medicare Other | Admitting: Orthopedic Surgery

## 2020-03-12 ENCOUNTER — Encounter: Payer: Self-pay | Admitting: Orthopedic Surgery

## 2020-03-12 VITALS — BP 118/74 | HR 63 | Temp 97.7°F | Resp 20 | Ht 72.0 in | Wt 212.4 lb

## 2020-03-12 DIAGNOSIS — J36 Peritonsillar abscess: Secondary | ICD-10-CM

## 2020-03-12 DIAGNOSIS — R140 Abdominal distension (gaseous): Secondary | ICD-10-CM | POA: Diagnosis not present

## 2020-03-12 DIAGNOSIS — J449 Chronic obstructive pulmonary disease, unspecified: Secondary | ICD-10-CM

## 2020-03-12 MED ORDER — FUROSEMIDE 40 MG PO TABS: ORAL_TABLET | ORAL | 3 refills | Status: DC

## 2020-03-12 MED ORDER — SIMETHICONE 80 MG PO CHEW: 80.0000 mg | CHEWABLE_TABLET | Freq: Four times a day (QID) | ORAL | 0 refills | Status: DC | PRN

## 2020-03-12 MED ORDER — PREDNISONE 10 MG (21) PO TBPK: ORAL_TABLET | ORAL | 0 refills | Status: DC

## 2020-03-12 MED ORDER — PREDNISONE 10 MG PO TABS: 10.0000 mg | ORAL_TABLET | Freq: Every day | ORAL | 0 refills | Status: DC

## 2020-03-12 MED ORDER — AMOXICILLIN-POT CLAVULANATE 875-125 MG PO TABS: 1.0000 | ORAL_TABLET | Freq: Two times a day (BID) | ORAL | 0 refills | Status: DC

## 2020-03-12 NOTE — Progress Notes (Signed)
Careteam: Patient Care Team: [name], NP as PCP - General (Geriatric Medicine) Belva Crome, MD as PCP - Cardiology (Cardiology) [name], MD as PCP - Electrophysiology (Cardiology) Roel Cluck, MD as Referring Physician (Ophthalmology) Harriett Sine, MD as Consulting Physician (Dermatology)  Seen by: [name], AGNP-C  PLACE OF SERVICE:  [place] Directive information    Allergies  Allergen Reactions  . Sulfa Antibiotics Hives    Chief Complaint  Patient presents with  . Acute Visit    [place] he also has pain and swelling in his abdomin      HPI: Patient is a 79 y.o. male seen today for difficulty breathing. PMH includes: CAD, hypertension, systolic heart failure, atrial fibrillation, COPD grade C, paraseptal emphysema, GERD, hyperlipidemia and BPH. Last seen by Sherrie Mustache NP 12/3.   His difficulty breathing started about 3-4 days ago. Claims it is with exertion and rest. He has been using his albuterol nebulizer twice a day for last 3 days. States it helps his breathing. Did neb prior to coming to visit today. Claims he has quit smoking for over a week. Wife has also quit smoking. Since cessation, he has been coughin up more secretions, grey in color. Started mucinex twice daily to help. Denies coughing more secretions.   Wife had a cold over 2 weeks ago. Cold described as runny nose with non-productive cough. Denies being in contact with anyone who recently had covid.   Continues to weight himself daily, thinks he has gained 2 pounds in past week. Taking furosemide 20 mg daily. Taking 1/2 benzapril daily. States these medications were reduced by cardiologist due to low blood pressure.   Also states he feels bloated today. He is having regular bowel movements and passing gas. Thinks his appetite has decreased and does not know why. Denies taking any nutritional meal supplements.   Previously seen by ENT for mass on  his throat. Small mass located on left and right side . Right side has enlarged within the past few days. Denies pain or trouble swallowing. Considering seeing ENT for issue.   Review of Systems:  Review of Systems  Constitutional: Negative for chills, fever and malaise/fatigue.  HENT: Negative for sore throat.        Throat mass  Respiratory: Positive for cough and wheezing. Negative for shortness of breath.        Chronic cough  Cardiovascular: Negative for chest pain, orthopnea and leg swelling.  Gastrointestinal: Negative for abdominal pain and heartburn.  Neurological: Negative for dizziness and weakness.  Psychiatric/Behavioral: Negative for depression. The patient is not nervous/anxious and does not have insomnia.     Past Medical History:  Diagnosis Date  . AICD (automatic cardioverter/defibrillator) present   . Aneurysm ([place])    a. Aneurysmal infrarenal aorta up to 33 mm on CT 10/2014, recommended f/u due 10/2017  . Anginal pain ([place])   . Anxiety   . Basal cell carcinoma of nose    S/P MOHS  . Biliary acute pancreatitis   . CAD (coronary artery disease)    a. s/p MI in 1994 with PCI to LAD at that time b. cath 10/2012 demonstrated EF 30%, inferior akinesis with mild hypokinesis of all walls, patent LAD and RCA stents; ostial PDA with 80-90% obstruction with medical therapy recommended   . Chronic systolic CHF (congestive heart failure) (HCC)    EF 30 to 35 % as of 09/2014.   [name]  CKD (chronic kidney disease), stage III ([place])   . Complication of anesthesia 10/2014   "had to have defibrillator w/ERCP"  . COPD (chronic obstructive pulmonary disease) ([place])    a. followed by pulmonary, COPD GOLD stage II  . Depression   . Diverticulosis of colon 07/2014   noted on CT  . GERD (gastroesophageal reflux disease)   . Hiatal hernia   . Hyperglycemia 10/2012.  [name] Hyperlipidemia   . Hypertension   . Myocardial infarction [facility]) 1994; 2011  . Pneumonia 1946; 2015  . Prostate enlargement  07/2014   observed on CT  . Tobacco abuse   . Ventricular tachycardia ([place])    a. 08/2009 s/p BSX E110 Teligen 100 AICD, ser#: [id number];  b. 08/2008 VT req ATP - detection reprogrammed from 160 to 150. c. EPS and VT ablation by Dr. Lovena Le 12/21/2014   Past Surgical History:  Procedure Laterality Date  . BIOPSY  12/21/2017   Procedure: BIOPSY;  Surgeon: [name], MD;  Location: [facility];  Service: Endoscopy;;  . CATARACT EXTRACTION W/ INTRAOCULAR LENS  IMPLANT, BILATERAL Bilateral ~ 2011  . COLONOSCOPY    . COLONOSCOPY WITH PROPOFOL N/A 12/21/2017   Procedure: COLONOSCOPY WITH PROPOFOL;  Surgeon: [name], MD;  Location: [facility];  Service: Endoscopy;  Laterality: N/A;  . ELECTROPHYSIOLOGIC STUDY N/A 12/21/2014   Procedure: V Tach Ablation;  Surgeon: [name], MD;  Location: [facility];  Service: Cardiovascular;  Laterality: N/A;  . ERCP N/A 11/16/2014   Procedure: ENDOSCOPIC RETROGRADE CHOLANGIOPANCREATOGRAPHY (ERCP);  Surgeon: [name], MD;  Location: [place];  Service: Endoscopy;  Laterality: N/A;  . ESOPHAGOGASTRODUODENOSCOPY (EGD) WITH PROPOFOL N/A 12/21/2017   Procedure: ESOPHAGOGASTRODUODENOSCOPY (EGD) WITH PROPOFOL;  Surgeon: [name], MD;  Location: [facility];  Service: Endoscopy;  Laterality: N/A;  . EYE SURGERY    . FOOT SURGERY Left 2005   "fixed bone that stuck out in my ankle area"  . HEMORRHOID BANDING    . IMPLANTABLE CARDIOVERTER DEFIBRILLATOR IMPLANT  09/06/09   BSX dual chamber ICD implanted in [place] for cardiac arrest 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  . INGUINAL HERNIA REPAIR Right ~ 1995  . LEFT HEART CATHETERIZATION WITH CORONARY ANGIOGRAM N/A 11/25/2012   demonstrated EF 30%, inferior akinesis with mild hypokinesis of all walls, patent LAD and RCA stents; ostial PDA with 80-90% obstruction with medical therapy recommended  . MOHS SURGERY  2008   nose, skin graft  . POLYPECTOMY  12/21/2017   Procedure: POLYPECTOMY;  Surgeon: [name], MD;  Location: [facility];  Service: Endoscopy;;  . RETINAL DETACHMENT SURGERY Right 2013  . TENOLYSIS Right 12/21/2013   Procedure: TENOLYSIS FLEXOR CARPI RADIALIS ,DEBRIDEMENT RIGHT JOINT WRIST,DEBRIDEMENT SCAPHOTRAPEZIAL TRAPEZOID, REPAIR OF EXTENSOR HOOD;  Surgeon: Daryll Brod, MD;  Location: [facility];  Service: Orthopedics;  Laterality: Right;  . TOE SURGERY Right 09/2019   3rd toe/hammer toe  . V-TACH ABLATION  12/21/2014  . VIDEO BRONCHOSCOPY Bilateral 01/09/2016   Procedure: VIDEO BRONCHOSCOPY WITHOUT FLUORO;  Surgeon: Juanito Doom, MD;  Location: [facility];  Service: Cardiopulmonary;  Laterality: Bilateral;   Social History:   reports that he has quit smoking. His smoking use included cigarettes. He has a 55.00 pack-year smoking history. He has never used smokeless tobacco. He reports current alcohol use. He reports that he does not use drugs.  Family History  Problem Relation Age of Onset  . Heart attack Brother   . CAD  Father   . Hypertension Father   . CAD Mother   . Hypertension Mother   . Hypertension Brother   . Stroke Neg Hx     Medications: Patient's Medications  New Prescriptions   No medications on file  Previous Medications   ALBUTEROL (PROVENTIL) (2.5 MG/3ML) 0.083% NEBULIZER SOLUTION    USE 1 VIAL VIA NEBULIZER EVERY 6 HOURS AS NEEDED FOR WHEEZING OR SHORTNESS OF BREATH   AMIODARONE (PACERONE) 200 MG TABLET    TAKE 1 TABLET BY MOUTH EVERY DAY   APIXABAN (ELIQUIS) 5 MG TABS TABLET    Take 1 tablet (5 mg total) by mouth 2 (two) times daily.   ARTIFICIAL TEAR SOLUTION (SOOTHE XP OP)    Place 2 drops into both eyes daily as needed (dry eyes).   ASPIRIN EC 81 MG TABLET    Take 1 tablet (81 mg total) by mouth every Monday, Wednesday, and Friday.   ATORVASTATIN (LIPITOR) 80 MG TABLET    TAKE 1 TABLET(80 MG) BY MOUTH DAILY   BENAZEPRIL (LOTENSIN) 10 MG TABLET    Take 5 mg by mouth daily.   BUPROPION (WELLBUTRIN) 75 MG TABLET    TAKE 1  TABLET BY MOUTH EVERY DAY   BUSPIRONE (BUSPAR) 15 MG TABLET    Take 15 mg by mouth 2 (two) times daily.   CARVEDILOL (COREG) 3.125 MG TABLET    TAKE 1 TABLET BY MOUTH TWICE A DAY   CHOLINE FENOFIBRATE (FENOFIBRIC ACID) 135 MG CPDR    TAKE 1 CAPSULE BY MOUTH DAILY   ESCITALOPRAM (LEXAPRO) 20 MG TABLET    TAKE 1 TABLET BY MOUTH EVERY DAY   FLUTICASONE (FLONASE) 50 MCG/ACT NASAL SPRAY    Place 2 sprays into both nostrils as needed.    FUROSEMIDE (LASIX) 40 MG TABLET    TAKE 1/2 TABLET(20 MG) BY MOUTH DAILY   IPRATROPIUM-ALBUTEROL (COMBIVENT RESPIMAT) 20-100 MCG/ACT AERS RESPIMAT    Inhale 1 puff into the lungs every 6 (six) hours. Shortness of breath or wheezing   IRON PO    Take by mouth daily.    MAGNESIUM-OXIDE 400 (241.3 MG) MG TABLET    TAKE 1 TABSULE BY MOUTH DAILY   MEXILETINE (MEXITIL) 200 MG CAPSULE    TAKE 1 CAPSULE(200 MG) BY MOUTH TWICE DAILY   MULTIPLE VITAMINS-MINERALS (CENTRUM ADULTS PO)    Take by mouth daily.   NITROGLYCERIN (NITROSTAT) 0.4 MG SL TABLET    DISSOLVE 1 TABLET UNDER THE TONGUE EVERY 5 MINUTES FOR 3 DOSES AS NEEDED FOR CHEST PAIN   OMEPRAZOLE (PRILOSEC) 20 MG CAPSULE    Take 20 mg by mouth daily.    POTASSIUM CHLORIDE SA (KLOR-CON M20) 20 MEQ TABLET    TAKE 1 TABLET BY MOUTH EVERY DAY   RESPIRATORY THERAPY SUPPLIES (FLUTTER) DEVI    Use as directed   SENNOSIDES-DOCUSATE SODIUM (STOOL SOFTENER/LAXATIVE PO)    Take 2 tablets by mouth daily.   SPACER/AERO-HOLDING CHAMBERS (OPTICHAMBER DIAMOND) MISC    optichamber VHC   SPIRIVA RESPIMAT 2.5 MCG/ACT AERS    INHALE 2 PUFFS INTO THE LUNGS DAILY   SYMBICORT 160-4.5 MCG/ACT INHALER    INHALE 2 PUFFS BY MOUTH TWICE A DAY   TAMSULOSIN (FLOMAX) 0.4 MG CAPS CAPSULE    TAKE 1 CAPSULE(0.4 MG) BY MOUTH DAILY AFTER SUPPER   VITAMIN B-12 (CYANOCOBALAMIN) 1000 MCG TABLET    Take 1 tablet (1,000 mcg total) by mouth daily.  Modified Medications   No medications on file  Discontinued Medications  No medications on file    Physical  Exam:  Vitals:   03/12/20 1204  BP: 118/74  Pulse: 63  Resp: 20  Temp: 97.7 F (36.5 C)  TempSrc: Temporal  SpO2: 90%  Weight: 212 lb 6.4 oz (96.3 kg)  Height: 6' (1.829 m)   Body mass index is 28.81 kg/m. Wt Readings from Last 3 Encounters:  03/12/20 212 lb 6.4 oz (96.3 kg)  03/01/20 208 lb 3.2 oz (94.4 kg)  02/05/20 190 lb 9.6 oz (86.5 kg)    Physical Exam Constitutional:      Appearance: Normal appearance. He is normal weight.     Comments: Smells of cigarette smoke  HENT:     Head: Normocephalic.     Mouth/Throat:   Cardiovascular:     Rate and Rhythm: Normal rate. Rhythm irregular.     Pulses: Normal pulses.     Heart sounds: Normal heart sounds. No murmur heard.   Pulmonary:     Effort: Pulmonary effort is normal. No respiratory distress.     Breath sounds: Examination of the right-upper field reveals wheezing. Examination of the left-upper field reveals wheezing. Examination of the right-middle field reveals wheezing and rhonchi. Examination of the left-middle field reveals wheezing and rhonchi. Wheezing and rhonchi present.  Abdominal:     General: Bowel sounds are normal. There is no distension.     Palpations: Abdomen is soft.     Tenderness: There is no abdominal tenderness.  Musculoskeletal:     Comments: Ambulating with cane  Skin:    Capillary Refill: Capillary refill takes 2 to 3 seconds.     Comments: Clubbing of fingers  Neurological:     General: No focal deficit present.     Mental Status: He is alert and oriented to person, place, and time. Mental status is at baseline.  Psychiatric:        Mood and Affect: Mood normal.        Behavior: Behavior normal.        Thought Content: Thought content normal.        Judgment: Judgment normal.     Labs reviewed: Basic Metabolic Panel: Recent Labs    03/01/20 1344  NA 139  K 4.4  CL 104  CO2 25  GLUCOSE 84  BUN 11  CREATININE 1.08  CALCIUM 9.4   Liver Function Tests: Recent Labs     03/01/20 1344  AST 34  ALT 45  BILITOT 0.6  PROT 6.4   No results for input(s): LIPASE, AMYLASE in the last 8760 hours. No results for input(s): AMMONIA in the last 8760 hours. CBC: Recent Labs    03/01/20 1344  WBC 5.8  NEUTROABS 3,921  HGB 13.2  HCT 39.4  MCV 99.2  PLT 185   Lipid Panel: Recent Labs    03/01/20 1344  CHOL 136  HDL 54  LDLCALC 67  TRIG 71  CHOLHDL 2.5   TSH: No results for input(s): TSH in the last 8760 hours. A1C: Lab Results  Component Value Date   HGBA1C 5.6 12/29/2016     Assessment/Plan 1. COPD mixed type ([place]) - oxygenation 90%, breathing unlabored, can talk without becoming short of breath - wheezing present on left and right mid and upper lobes - suspect exacerbation, he smells of cigarette smoke although he claims to have quit - predniSONE (STERAPRED UNI-PAK 42 TAB) 10 MG (42) TBPK tablet; Take 6 tablets by mouth for 2 days, then take 5 tablets by mouth for 2  days, then take 4 tablets by mouth for 2 days, then take 3 tablets by mouth for 2 days, then take 2 tablets by mouth for 2 days, then take 1 tablet by mouth for 2 days  Dispense: 42 tablet; Refill: 0 - he is 4 pounds heavier from last encounter, patient thinks he is 2 pounds heavier - will increase lasix to 40 mg for three days, then may return to 20 mg daily - furosemide (LASIX) 40 MG tablet; TAKE 1 TABLET(20 MG) BY MOUTH DAILY for 3 days, then resume 1/2 tablet daily  Dispense: 45 tablet; Refill: 3 - continue taking mucinex 1200 mg twice daily for chest secretions - continue albuterol nebulizer Q 6 prn - continue smoking cessation - recommend purchasing pulse oximeter for home - advised to report to local ED if breathing becomes worse  2. Abdominal bloating - abdomen is soft with active bowel sounds, admits to passing gas - simethicone (GAS-X) 80 MG chewable tablet; Chew 1 tablet (80 mg total) by mouth every 6 (six) hours as needed for flatulence.  Dispense: 30 tablet; Refill:  0  3. Peritonsillar abscess - ongoing, claims area comes and goes, has been treated with antibiotics in past - suspect abscess due to size, color and location, uvula not deviated - amoxicillin-clavulanate (AUGMENTIN) 875-125 MG tablet; Take 1 tablet by mouth 2 (two) times daily.  Dispense: 20 tablet; Refill: 0 - advised to complete antibiotics as prescribed - advised to contact PCP or ENT if area does not subside or becomes worse   Next appt: Visit date not found [place] [phone]   Encounter time: 35 minutes of face to face time with patient

## 2020-03-12 NOTE — Patient Instructions (Signed)
Recommending purchasing pulse oximetry to monitor breathing Continue daily weights - Increase Furosemide to 1 tablet by mouth daily for next 3 days - continue smoking cessation - continue mucinex by mouth daily (1200 mg twice a day)   Complete antibiotic, if it has not helped, contact ENT   Chronic Obstructive Pulmonary Disease Exacerbation Chronic obstructive pulmonary disease (COPD) is a long-term (chronic) lung problem. In COPD, the flow of air from the lungs is limited. COPD exacerbations are times that breathing gets worse and you need more than your normal treatment. Without treatment, they can be life threatening. If they happen often, your lungs can become more damaged. If your COPD gets worse, your doctor may treat you with:  Medicines.  Oxygen.  Different ways to clear your airway, such as using a mask. Follow these instructions at home: Medicines  Take over-the-counter and prescription medicines only as told by your doctor.  If you take an antibiotic or steroid medicine, do not stop taking the medicine even if you start to feel better.  Keep up with shots (vaccinations) as told by your doctor. Be sure to get a yearly (annual) flu shot. Lifestyle  Do not smoke. If you need help quitting, ask your doctor.  Eat healthy foods.  Exercise regularly.  Get plenty of sleep.  Avoid tobacco smoke and other things that can bother your lungs.  Wash your hands often with soap and water. This will help keep you from getting an infection. If you cannot use soap and water, use hand sanitizer.  During flu season, avoid areas that are crowded with people. General instructions  Drink enough fluid to keep your pee (urine) clear or pale yellow. Do not do this if your doctor has told you not to.  Use a cool mist machine (vaporizer).  If you use oxygen or a machine that turns medicine into a mist (nebulizer), continue to use it as told.  Follow all instructions for rehabilitation.  These are steps you can take to make your body work better.  Keep all follow-up visits as told by your doctor. This is important. Contact a doctor if:  Your COPD symptoms get worse than normal. Get help right away if:  You are short of breath and it gets worse.  You have trouble talking.  You have chest pain.  You cough up blood.  You have a fever.  You keep throwing up (vomiting).  You feel weak or you pass out (faint).  You feel confused.  You are not able to sleep because of your symptoms.  You are not able to do daily activities. Summary  COPD exacerbations are times that breathing gets worse and you need more treatment than normal.  COPD exacerbations can be very serious and may cause your lungs to become more damaged.  Do not smoke. If you need help quitting, ask your doctor.  Stay up-to-date on your shots. Get a flu shot every year. This information is not intended to replace advice given to you by your health care provider. Make sure you discuss any questions you have with your health care provider. Document Revised: 02/26/2017 Document Reviewed: 04/20/2016 Elsevier Patient Education  2020 [employer].

## 2020-03-13 ENCOUNTER — Other Ambulatory Visit: Payer: Self-pay | Admitting: Pulmonary Disease

## 2020-03-13 DIAGNOSIS — J432 Centrilobular emphysema: Secondary | ICD-10-CM

## 2020-03-18 ENCOUNTER — Emergency Department (HOSPITAL_BASED_OUTPATIENT_CLINIC_OR_DEPARTMENT_OTHER): Payer: Medicare Other

## 2020-03-18 ENCOUNTER — Emergency Department (HOSPITAL_BASED_OUTPATIENT_CLINIC_OR_DEPARTMENT_OTHER)
Admission: EM | Admit: 2020-03-18 | Discharge: 2020-03-18 | Disposition: A | Discharge status: Home / Self Care | Payer: Medicare Other | Attending: Emergency Medicine | Admitting: Emergency Medicine

## 2020-03-18 ENCOUNTER — Encounter (HOSPITAL_BASED_OUTPATIENT_CLINIC_OR_DEPARTMENT_OTHER): Payer: Self-pay | Admitting: *Deleted

## 2020-03-18 ENCOUNTER — Other Ambulatory Visit: Payer: Self-pay

## 2020-03-18 DIAGNOSIS — I700 Atherosclerosis of aorta: Secondary | ICD-10-CM | POA: Diagnosis not present

## 2020-03-18 DIAGNOSIS — Z7901 Long term (current) use of anticoagulants: Secondary | ICD-10-CM | POA: Insufficient documentation

## 2020-03-18 DIAGNOSIS — Z87891 Personal history of nicotine dependence: Secondary | ICD-10-CM | POA: Insufficient documentation

## 2020-03-18 DIAGNOSIS — N183 Chronic kidney disease, stage 3 unspecified: Secondary | ICD-10-CM | POA: Diagnosis not present

## 2020-03-18 DIAGNOSIS — J449 Chronic obstructive pulmonary disease, unspecified: Secondary | ICD-10-CM | POA: Insufficient documentation

## 2020-03-18 DIAGNOSIS — J9811 Atelectasis: Secondary | ICD-10-CM | POA: Diagnosis not present

## 2020-03-18 DIAGNOSIS — Z7952 Long term (current) use of systemic steroids: Secondary | ICD-10-CM | POA: Insufficient documentation

## 2020-03-18 DIAGNOSIS — W19XXXA Unspecified fall, initial encounter: Secondary | ICD-10-CM | POA: Insufficient documentation

## 2020-03-18 DIAGNOSIS — S2242XA Multiple fractures of ribs, left side, initial encounter for closed fracture: Secondary | ICD-10-CM | POA: Diagnosis not present

## 2020-03-18 DIAGNOSIS — I5022 Chronic systolic (congestive) heart failure: Secondary | ICD-10-CM | POA: Insufficient documentation

## 2020-03-18 DIAGNOSIS — Z79899 Other long term (current) drug therapy: Secondary | ICD-10-CM | POA: Diagnosis not present

## 2020-03-18 DIAGNOSIS — S2232XA Fracture of one rib, left side, initial encounter for closed fracture: Secondary | ICD-10-CM | POA: Diagnosis not present

## 2020-03-18 DIAGNOSIS — I2510 Atherosclerotic heart disease of native coronary artery without angina pectoris: Secondary | ICD-10-CM | POA: Diagnosis not present

## 2020-03-18 DIAGNOSIS — S299XXA Unspecified injury of thorax, initial encounter: Secondary | ICD-10-CM | POA: Diagnosis present

## 2020-03-18 DIAGNOSIS — I130 Hypertensive heart and chronic kidney disease with heart failure and stage 1 through stage 4 chronic kidney disease, or unspecified chronic kidney disease: Secondary | ICD-10-CM | POA: Insufficient documentation

## 2020-03-18 DIAGNOSIS — Z7982 Long term (current) use of aspirin: Secondary | ICD-10-CM | POA: Insufficient documentation

## 2020-03-18 DIAGNOSIS — S2232XD Fracture of one rib, left side, subsequent encounter for fracture with routine healing: Secondary | ICD-10-CM | POA: Diagnosis not present

## 2020-03-18 MED ORDER — HYDROCODONE-ACETAMINOPHEN 5-325 MG PO TABS: 1.0000 | ORAL_TABLET | ORAL | 0 refills | Status: DC | PRN

## 2020-03-18 NOTE — Discharge Instructions (Signed)
Return if any problems.

## 2020-03-18 NOTE — ED Provider Notes (Signed)
[facility] EMERGENCY DEPARTMENT Provider Note   CSN: [id number] Arrival date & time: 03/18/20  1523     History Chief Complaint  Patient presents with  . Fall    Steven Guzman is a 79 y.o. male.  Pt fell and hit left ribs.  Pt thinks he has a rib fracture   The history is provided by the patient.  Fall This is a new problem. Episode onset: 4 days ago. The problem occurs constantly. The problem has not changed since onset.Associated symptoms include chest pain. Nothing aggravates the symptoms. Nothing relieves the symptoms. He has tried nothing for the symptoms. The treatment provided no relief.   Pt fell and hit     Past Medical History:  Diagnosis Date  . AICD (automatic cardioverter/defibrillator) present   . Aneurysm ([place])    a. Aneurysmal infrarenal aorta up to 33 mm on CT 10/2014, recommended f/u due 10/2017  . Anginal pain ([place])   . Anxiety   . Basal cell carcinoma of nose    S/P MOHS  . Biliary acute pancreatitis   . CAD (coronary artery disease)    a. s/p MI in 1994 with PCI to LAD at that time b. cath 10/2012 demonstrated EF 30%, inferior akinesis with mild hypokinesis of all walls, patent LAD and RCA stents; ostial PDA with 80-90% obstruction with medical therapy recommended   . Chronic systolic CHF (congestive heart failure) (HCC)    EF 30 to 35 % as of 09/2014.   [name] CKD (chronic kidney disease), stage III ([place])   . Complication of anesthesia 10/2014   "had to have defibrillator w/ERCP"  . COPD (chronic obstructive pulmonary disease) ([place])    a. followed by pulmonary, COPD GOLD stage II  . Depression   . Diverticulosis of colon 07/2014   noted on CT  . GERD (gastroesophageal reflux disease)   . Hiatal hernia   . Hyperglycemia 10/2012.  [name] Hyperlipidemia   . Hypertension   . Myocardial infarction [facility]) 1994; 2011  . Pneumonia 1946; 2015  . Prostate enlargement 07/2014   observed on CT  . Tobacco abuse   . Ventricular tachycardia ([place])    a.  08/2009 s/p BSX E110 Teligen 100 AICD, ser#: [id number];  b. 08/2008 VT req ATP - detection reprogrammed from 160 to 150. c. EPS and VT ablation by Dr. Lovena Le 12/21/2014    Patient Active Problem List   Diagnosis Date Noted  . Major depressive disorder with single episode, in partial remission ([place]) 03/05/2020  . Paraseptal emphysema ([place]) 12/15/2018  . Tobacco abuse 12/15/2018  . Perianal abscess 11/15/2018  . Left inguinal hernia 11/15/2018  . Leg wound, right 11/15/2018  . Weight loss 09/29/2018  . Dark stools 09/29/2018  . Lumbar trigger point syndrome 04/20/2018  . Throat and mouth symptom 02/03/2018  . Gastroesophageal reflux disease without esophagitis   . Esophageal stricture   . Gastritis and gastroduodenitis   . Degenerative cervical spinal stenosis 10/27/2017  . Carotid bruit 10/27/2017  . B12 deficiency 08/25/2017  . Anemia 06/10/2017  . Right ankle pain 04/09/2017  . BPH (benign prostatic hyperplasia) 12/31/2016  . Dizzinesses 05/22/2016  . Mass of throat 06/30/2015  . Depression 05/24/2015  . Atrial fibrillation ([place]) 03/15/2015  . Routine general medical examination at a health care facility 05/12/2014  . Hemoptysis 03/16/2014  . COPD GOLD GRADE C 12/25/2013  . Chronic systolic heart failure ([place]) 07/07/2013  . CAD (coronary artery disease) 11/23/2012  . Smokers' cough ([place])  11/23/2012  . Ventricular tachycardia ([place]) 11/23/2012  . Essential hypertension 11/23/2012  . Hyperlipidemia 11/23/2012  . ICD (implantable cardioverter-defibrillator) in place 11/23/2012    Past Surgical History:  Procedure Laterality Date  . BIOPSY  12/21/2017   Procedure: BIOPSY;  Surgeon: [name], MD;  Location: [facility];  Service: Endoscopy;;  . CATARACT EXTRACTION W/ INTRAOCULAR LENS  IMPLANT, BILATERAL Bilateral ~ 2011  . COLONOSCOPY    . COLONOSCOPY WITH PROPOFOL N/A 12/21/2017   Procedure: COLONOSCOPY WITH PROPOFOL;  Surgeon: [name], MD;  [facility]: [facility];   Service: Endoscopy;  Laterality: N/A;  . ELECTROPHYSIOLOGIC STUDY N/A 12/21/2014   Procedure: V Tach Ablation;  Surgeon: [name], MD;  Location: [facility];  Service: Cardiovascular;  Laterality: N/A;  . ERCP N/A 11/16/2014   Procedure: ENDOSCOPIC RETROGRADE CHOLANGIOPANCREATOGRAPHY (ERCP);  Surgeon: [name], MD;  Location: [facility];  Service: Endoscopy;  Laterality: N/A;  . ESOPHAGOGASTRODUODENOSCOPY (EGD) WITH PROPOFOL N/A 12/21/2017   Procedure: ESOPHAGOGASTRODUODENOSCOPY (EGD) WITH PROPOFOL;  Surgeon: [name], MD;  [facility];  Service: Endoscopy;  Laterality: N/A;  . EYE SURGERY    . FOOT SURGERY Left 2005   "fixed bone that stuck out in my ankle area"  . HEMORRHOID BANDING    . IMPLANTABLE CARDIOVERTER DEFIBRILLATOR IMPLANT  09/06/09   BSX dual chamber ICD implanted in [place] for cardiac arrest 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  . INGUINAL HERNIA REPAIR Right ~ 1995  . LEFT HEART CATHETERIZATION WITH CORONARY ANGIOGRAM N/A 11/25/2012   demonstrated EF 30%, inferior akinesis with mild hypokinesis of all walls, patent LAD and RCA stents; ostial PDA with 80-90% obstruction with medical therapy recommended  . MOHS SURGERY  2008   nose, skin graft  . POLYPECTOMY  12/21/2017   Procedure: POLYPECTOMY;  Surgeon: [name], MD;  Location: [facility];  Service: Endoscopy;;  . RETINAL DETACHMENT SURGERY Right 2013  . TENOLYSIS Right 12/21/2013   Procedure: TENOLYSIS FLEXOR CARPI RADIALIS ,DEBRIDEMENT RIGHT JOINT WRIST,DEBRIDEMENT SCAPHOTRAPEZIAL TRAPEZOID, REPAIR OF EXTENSOR HOOD;  Surgeon: Daryll Brod, MD;  Location: [facility];  Service: Orthopedics;  Laterality: Right;  . TOE SURGERY Right 09/2019   3rd toe/hammer toe  . V-TACH ABLATION  12/21/2014  . VIDEO BRONCHOSCOPY Bilateral 01/09/2016   Procedure: VIDEO BRONCHOSCOPY WITHOUT FLUORO;  Surgeon: Juanito Doom, MD;  [facility];  Service: Cardiopulmonary;  Laterality:  Bilateral;       Family History  Problem Relation Age of Onset  . Heart attack Brother   . CAD Father   . Hypertension Father   . CAD Mother   . Hypertension Mother   . Hypertension Brother   . Stroke Neg Hx     Social History   Tobacco Use  . Smoking status: Former Smoker    Packs/day: 1.00    Years: 55.00    Pack years: 55.00    Types: Cigarettes  . Smokeless tobacco: Never Used  . Tobacco comment: off/on, always ready to quit but does not work out  Media planner  . Vaping Use: Never used  Substance Use Topics  . Alcohol use: Yes    Alcohol/week: 0.0 standard drinks    Comment: occ  . Drug use: No    Home Medications Prior to Admission medications   Medication Sig Start Date End Date Taking? Authorizing Provider  albuterol (PROVENTIL) (2.5 MG/3ML) 0.083% nebulizer solution USE 1 VIAL VIA NEBULIZER EVERY 6 HOURS AS NEEDED FOR WHEEZING  OR SHORTNESS OF BREATH 07/18/19   Laurin Coder, MD  amiodarone (PACERONE) 200 MG tablet TAKE 1 TABLET BY MOUTH EVERY DAY 12/06/19   Belva Crome, MD  amoxicillin-clavulanate (AUGMENTIN) 875-125 MG tablet Take 1 tablet by mouth 2 (two) times daily. 03/12/20   Fargo, Amy E, NP  apixaban (ELIQUIS) 5 MG TABS tablet Take 1 tablet (5 mg total) by mouth 2 (two) times daily. 06/29/19   [name], MD  Artificial Tear Solution (SOOTHE XP OP) Place 2 drops into both eyes daily as needed (dry eyes).    [provider]  aspirin EC 81 MG tablet Take 1 tablet (81 mg total) by mouth every Monday, Wednesday, and Friday. 04/25/18   Belva Crome, MD  atorvastatin (LIPITOR) 80 MG tablet TAKE 1 TABLET(80 MG) BY MOUTH DAILY 08/09/19   Belva Crome, MD  benazepril (LOTENSIN) 10 MG tablet Take 5 mg by mouth daily.    [provider]  buPROPion (WELLBUTRIN) 75 MG tablet TAKE 1 TABLET BY MOUTH EVERY DAY 10/23/19   [name], NP  busPIRone (BUSPAR) 15 MG tablet Take 15 mg by mouth 2 (two) times daily.    [provider]   carvedilol (COREG) 3.125 MG tablet TAKE 1 TABLET BY MOUTH TWICE A DAY 12/19/19   [name], NP  Choline Fenofibrate (FENOFIBRIC ACID) 135 MG CPDR TAKE 1 CAPSULE BY MOUTH DAILY 05/25/19   Belva Crome, MD  escitalopram (LEXAPRO) 20 MG tablet TAKE 1 TABLET BY MOUTH EVERY DAY 02/05/20   [name], NP  fluticasone (FLONASE) 50 MCG/ACT nasal spray Place 2 sprays into both nostrils as needed.  04/14/18   [provider]  furosemide (LASIX) 40 MG tablet TAKE 1 TABLET(20 MG) BY MOUTH DAILY for 3 days, then resume 1/2 tablet daily 03/12/20   Fargo, Amy E, NP  HYDROcodone-acetaminophen (NORCO/VICODIN) 5-325 MG tablet Take 1 tablet by mouth every 4 (four) hours as needed for moderate pain. 03/18/20 03/18/21  Fransico Meadow, PA-C  Ipratropium-Albuterol (COMBIVENT RESPIMAT) 20-100 MCG/ACT AERS respimat Inhale 1 puff into the lungs every 6 (six) hours. Shortness of breath or wheezing 03/27/19   Sherrilyn Rist A, MD  IRON PO Take by mouth daily.     [provider]  MAGNESIUM-OXIDE 400 (241.3 Mg) MG tablet TAKE 1 TABSULE BY MOUTH DAILY 11/24/18   Hoyt Koch, MD  mexiletine (MEXITIL) 200 MG capsule TAKE 1 CAPSULE(200 MG) BY MOUTH TWICE DAILY 05/05/19   [name], MD  Multiple Vitamins-Minerals (CENTRUM ADULTS PO) Take by mouth daily.    [provider]  nitroGLYCERIN (NITROSTAT) 0.4 MG SL tablet DISSOLVE 1 TABLET UNDER THE TONGUE EVERY 5 MINUTES FOR 3 DOSES AS NEEDED FOR CHEST PAIN 09/26/18   [name], MD  omeprazole (PRILOSEC) 20 MG capsule Take 20 mg by mouth daily.     [provider]  potassium chloride SA (KLOR-CON M20) 20 MEQ tablet TAKE 1 TABLET BY MOUTH EVERY DAY 11/17/19   [name], NP  predniSONE (DELTASONE) 10 MG tablet Take 1 tablet (10 mg total) by mouth daily with breakfast. Take 6 tablets by mouth for 2 days, take 5 tablets by mouth for 2 days, take 4 tablets by mouth for 2 days, take 3 tablets by mouth for 2 days,  take 2 tablets by mouth for 2 days, take 1 tablet by mouth for 2 days 03/12/20   Yvonna Alanis, NP  Respiratory Therapy Supplies (FLUTTER)  DEVI Use as directed Patient not taking: Reported on 03/12/2020 03/16/17   Juanito Doom, MD  Sennosides-Docusate Sodium (STOOL SOFTENER/LAXATIVE PO) Take 2 tablets by mouth daily.    [provider]  simethicone (GAS-X) 80 MG chewable tablet Chew 1 tablet (80 mg total) by mouth every 6 (six) hours as needed for flatulence. 03/12/20   Yvonna Alanis, NP  Spacer/Aero-Holding [facility] DIAMOND) MISC optichamber [facility] 09/12/19   Olalere, Adewale A, MD  SPIRIVA RESPIMAT 2.5 MCG/ACT AERS INHALE 2 PUFFS INTO THE LUNGS DAILY 10/04/19   Laurin Coder, MD  SYMBICORT 160-4.5 MCG/ACT inhaler INHALE 2 PUFFS BY MOUTH TWICE A DAY 03/13/20   Olalere, Adewale A, MD  tamsulosin (FLOMAX) 0.4 MG CAPS capsule TAKE 1 CAPSULE(0.4 MG) BY MOUTH DAILY AFTER SUPPER 03/08/20   [name], NP  vitamin B-12 (CYANOCOBALAMIN) 1000 MCG tablet Take 1 tablet (1,000 mcg total) by mouth daily. 02/27/19   [name], NP    Allergies    Sulfa antibiotics  Review of Systems   Review of Systems  Cardiovascular: Positive for chest pain.  All other systems reviewed and are negative.   Physical Exam Updated Vital Signs BP 133/81   Pulse 69   Temp 97.8 F (36.6 C)   Resp 16   Ht 6' (1.829 m)   Wt 94.3 kg   SpO2 92%   BMI 28.21 kg/m   Physical Exam Vitals and nursing note reviewed.  Constitutional:      Appearance: He is well-developed and well-nourished.  HENT:     Head: Normocephalic and atraumatic.  Eyes:     Conjunctiva/sclera: Conjunctivae normal.  Cardiovascular:     Rate and Rhythm: Normal rate and regular rhythm.     Heart sounds: No murmur heard.     Comments: Tender left chest wall,  Pain to palpation  Pulmonary:     Effort: Pulmonary effort is normal. No respiratory distress.     Breath sounds: Normal breath sounds.   Abdominal:     Palpations: Abdomen is soft.     Tenderness: There is no abdominal tenderness.  Musculoskeletal:        General: No edema.     Cervical back: Neck supple.  Skin:    General: Skin is warm and dry.  Neurological:     Mental Status: He is alert.  Psychiatric:        Mood and Affect: Mood and affect normal.     ED Results / Procedures / Treatments   Labs (all labs ordered are listed, but only abnormal results are displayed) Labs Reviewed - No data to display  EKG None  Radiology DG Ribs Unilateral W/Chest Left  Result Date: 03/18/2020 CLINICAL DATA:  78 year old male with fall. EXAM: LEFT RIBS AND CHEST - 3+ VIEW COMPARISON:  Chest radiograph dated 09/11/2019. FINDINGS: Minimal left lung base atelectasis. No focal consolidation, pleural effusion or pneumothorax. The cardiac silhouette is within limits. Atherosclerotic calcification of the aortic arch. Left pectoral AICD device. Old healed fracture of lateral left eighth rib. There is a nondisplaced acute fracture of the anterolateral left ninth rib. IMPRESSION: Nondisplaced acute fracture of the anterolateral left ninth rib. No pneumothorax. Electronically Signed   By: Anner Crete M.D.   On: 03/18/2020 16:00    Procedures Procedures (including critical care time)  Medications Ordered in ED Medications - No data to display  ED Course  I have reviewed the triage vital signs and the nursing notes.  Pertinent labs &  imaging results that were available during my care of the patient were reviewed by me and considered in my medical decision making (see chart for details).    MDM Rules/Calculators/A&P                          MDM:  Pt counseled on xray.  Pt given rx for hydrocodne Final Clinical Impression(s) / ED Diagnoses Final diagnoses:  Closed fracture of one rib of left side, initial encounter    Rx / DC Orders ED Discharge Orders         Ordered    HYDROcodone-acetaminophen (NORCO/VICODIN) 5-325  MG tablet  Every 4 hours PRN        03/18/20 1722        An After Visit Summary was printed and given to the patient.    Fransico Meadow, [place] 03/18/20 1731    Lucrezia Starch, MD 03/21/20 [phone]

## 2020-03-18 NOTE — ED Triage Notes (Signed)
C/o fall x 4 days ago c/o left rib injury , pt is on blood thinner

## 2020-03-22 ENCOUNTER — Other Ambulatory Visit: Payer: Self-pay | Admitting: Pulmonary Disease

## 2020-03-30 NOTE — Progress Notes (Deleted)
Cardiology Office Note:    Date:  03/30/2020   ID:  Steven, Guzman July 05, 1940, MRN [id number]  PCP:  [name], NP  Cardiologist:  Sinclair Grooms, MD   Referring MD: [name], NP   No chief complaint on file.   History of Present Illness:    Steven Guzman is a 80 y.o. male with a hx of ischemic cardiomyopathy with EF 35%, COPD, hypertension, recent low blood pressures, CAD with prior coronary bypass grafting, continued tobacco abuse,PAF,ventricular tachycardiacontrolled on amiodarone and Mexitil, and implantable cardiac defibrillator.  ***  Past Medical History:  Diagnosis Date  . AICD (automatic cardioverter/defibrillator) present   . Aneurysm ([place])    a. Aneurysmal infrarenal aorta up to 33 mm on CT 10/2014, recommended f/u due 10/2017  . Anginal pain ([name])   . Anxiety   . Basal cell carcinoma of nose    S/P MOHS  . Biliary acute pancreatitis   . CAD (coronary artery disease)    a. s/p MI in 1994 with PCI to LAD at that time b. cath 10/2012 demonstrated EF 30%, inferior akinesis with mild hypokinesis of all walls, patent LAD and RCA stents; ostial PDA with 80-90% obstruction with medical therapy recommended   . Chronic systolic CHF (congestive heart failure) (HCC)    EF 30 to 35 % as of 09/2014.   [name] CKD (chronic kidney disease), stage III ([place])   . Complication of anesthesia 10/2014   "had to have defibrillator w/ERCP"  . COPD (chronic obstructive pulmonary disease) ([place])    a. followed by pulmonary, COPD GOLD stage II  . Depression   . Diverticulosis of colon 07/2014   noted on CT  . GERD (gastroesophageal reflux disease)   . Hiatal hernia   . Hyperglycemia 10/2012.  [name] Hyperlipidemia   . Hypertension   . Myocardial infarction [facility]) 1994; 2011  . Pneumonia 1946; 2015  . Prostate enlargement 07/2014   observed on CT  . Tobacco abuse   . Ventricular tachycardia ([name])    a. 08/2009 s/p BSX E110 Teligen 100 AICD, ser#: [id number];  b. 08/2008 VT  req ATP - detection reprogrammed from 160 to 150. c. EPS and VT ablation by Dr. Lovena Le 12/21/2014    Past Surgical History:  Procedure Laterality Date  . BIOPSY  12/21/2017   Procedure: BIOPSY;  Surgeon: [name], MD;  Location: [facility];  Service: Endoscopy;;  . CATARACT EXTRACTION W/ INTRAOCULAR LENS  IMPLANT, BILATERAL Bilateral ~ 2011  . COLONOSCOPY    . COLONOSCOPY WITH PROPOFOL N/A 12/21/2017   Procedure: COLONOSCOPY WITH PROPOFOL;  Surgeon: [name], MD;  [facility];  Service: Endoscopy;  Laterality: N/A;  . ELECTROPHYSIOLOGIC STUDY N/A 12/21/2014   Procedure: V Tach Ablation;  Surgeon: [name], MD;  Location: [facility];  Service: Cardiovascular;  Laterality: N/A;  . ERCP N/A 11/16/2014   Procedure: ENDOSCOPIC RETROGRADE CHOLANGIOPANCREATOGRAPHY (ERCP);  Surgeon: [name], MD;  Location: [facility];  Service: Endoscopy;  Laterality: N/A;  . ESOPHAGOGASTRODUODENOSCOPY (EGD) WITH PROPOFOL N/A 12/21/2017   Procedure: ESOPHAGOGASTRODUODENOSCOPY (EGD) WITH PROPOFOL;  Surgeon: [name], MD;  [facility];  Service: Endoscopy;  Laterality: N/A;  . EYE SURGERY    . FOOT SURGERY Left 2005   "fixed bone that stuck out in my ankle area"  . HEMORRHOID BANDING    . IMPLANTABLE CARDIOVERTER DEFIBRILLATOR IMPLANT  09/06/09   BSX dual chamber ICD implanted in [place]  for cardiac arrest 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  . INGUINAL HERNIA REPAIR Right ~ 1995  . LEFT HEART CATHETERIZATION WITH CORONARY ANGIOGRAM N/A 11/25/2012   demonstrated EF 30%, inferior akinesis with mild hypokinesis of all walls, patent LAD and RCA stents; ostial PDA with 80-90% obstruction with medical therapy recommended  . MOHS SURGERY  2008   nose, skin graft  . POLYPECTOMY  12/21/2017   Procedure: POLYPECTOMY;  Surgeon: [name], MD;  Location: [facility];  Service: Endoscopy;;  . RETINAL DETACHMENT SURGERY Right 2013  . TENOLYSIS Right 12/21/2013   Procedure:  TENOLYSIS FLEXOR CARPI RADIALIS ,DEBRIDEMENT RIGHT JOINT WRIST,DEBRIDEMENT SCAPHOTRAPEZIAL TRAPEZOID, REPAIR OF EXTENSOR HOOD;  Surgeon: [name], MD;  Location: [facility];  Service: Orthopedics;  Laterality: Right;  . TOE SURGERY Right 09/2019   3rd toe/hammer toe  . V-TACH ABLATION  12/21/2014  . VIDEO BRONCHOSCOPY Bilateral 01/09/2016   Procedure: VIDEO BRONCHOSCOPY WITHOUT FLUORO;  Surgeon: [name], MD;  [facility];  Service: Cardiopulmonary;  Laterality: Bilateral;    Current Medications: No outpatient medications have been marked as taking for the 04/04/20 encounter (Appointment) with Lyn Records, MD.     Allergies:   Sulfa antibiotics   Social History   Socioeconomic History  . Marital status: Married    Spouse name: Not on file  . Number of children: Not on file  . Years of education: Not on file  . Highest education level: Not on file  Occupational History  . Occupation: Retired  Tobacco Use  . Smoking status: Former Smoker    Packs/day: 1.00    Years: 55.00    Pack years: 55.00    Types: Cigarettes  . Smokeless tobacco: Never Used  . Tobacco comment: off/on, always ready to quit but does not work out  Advertising account planner  . Vaping Use: Never used  Substance and Sexual Activity  . Alcohol use: Yes    Alcohol/week: 0.0 standard drinks    Comment: occ  . Drug use: No  . Sexual activity: Not Currently  Other Topics Concern  . Not on file  Social History Narrative  . Not on file   Social Determinants of Health   Financial Resource Strain: Not on file  Food Insecurity: Not on file  Transportation Needs: Not on file  Physical Activity: Not on file  Stress: Not on file  Social Connections: Not on file     Family History: The patient's family history includes CAD in his father and mother; Heart attack in his brother; Hypertension in his brother, father, and mother. There is no history of Stroke.  ROS:   Please see the history  of present illness.    *** All other systems reviewed and are negative.  EKGs/Labs/Other Studies Reviewed:    The following studies were reviewed today: ***  EKG:  EKG ***  Recent Labs: 03/01/2020: ALT 45; BUN 11; Creat 1.08; Hemoglobin 13.2; Platelets 185; Potassium 4.4; Sodium 139  Recent Lipid Panel    Component Value Date/Time   CHOL 136 03/01/2020 1344   CHOL 157 09/21/2017 1546   TRIG 71 03/01/2020 1344   HDL 54 03/01/2020 1344   HDL 46 09/21/2017 1546   CHOLHDL 2.5 03/01/2020 1344   VLDL 16.6 12/29/2016 1618   LDLCALC 67 03/01/2020 1344    Physical Exam:    VS:  There were no vitals taken for this visit.    Wt Readings from Last 3 Encounters:  03/18/20 208 lb (  94.3 kg)  03/12/20 212 lb 6.4 oz (96.3 kg)  03/01/20 208 lb 3.2 oz (94.4 kg)     GEN: ***. No acute distress HEENT: Normal NECK: No JVD. LYMPHATICS: No lymphadenopathy CARDIAC: *** murmur. RRR *** gallop, or edema. VASCULAR: *** Normal Pulses. No bruits. RESPIRATORY:  Clear to auscultation without rales, wheezing or rhonchi  ABDOMEN: Soft, non-tender, non-distended, No pulsatile mass, MUSCULOSKELETAL: No deformity  SKIN: Warm and dry NEUROLOGIC:  Alert and oriented x 3 PSYCHIATRIC:  Normal affect   ASSESSMENT:    1. Coronary artery disease involving native coronary artery of native heart without angina pectoris   2. Ventricular tachycardia (HCC)   3. Paroxysmal atrial fibrillation ([place])   4. Chronic systolic heart failure ([place])   5. Essential hypertension   6. Other hyperlipidemia   7. Long term current use of amiodarone   8. Chronic anticoagulation   9. Educated about COVID-19 virus infection    PLAN:    In order of problems listed above:  1. ***   Medication Adjustments/Labs and Tests Ordered: Current medicines are reviewed at length with the patient today.  Concerns regarding medicines are outlined above.  No orders of the defined types were placed in this encounter.  No orders of  the defined types were placed in this encounter.   There are no Patient Instructions on file for this visit.   Signed, Sinclair Grooms, MD  03/30/2020 5:58 PM    [place]

## 2020-04-01 ENCOUNTER — Telehealth: Payer: Self-pay | Admitting: Pulmonary Disease

## 2020-04-01 NOTE — Telephone Encounter (Signed)
pt called in stating that the pharmacy said he must have an appt before we would refill - he said he wasn't going to make an appt to get a refill - states that he asked for an appt and was told that January shcedule was not ready - I offered appt and he stated that he would find another pulmonologist - and hung up.

## 2020-04-04 ENCOUNTER — Ambulatory Visit: Payer: Medicare Other | Admitting: Interventional Cardiology

## 2020-04-04 DIAGNOSIS — I10 Essential (primary) hypertension: Secondary | ICD-10-CM

## 2020-04-04 DIAGNOSIS — Z7189 Other specified counseling: Secondary | ICD-10-CM

## 2020-04-04 DIAGNOSIS — I472 Ventricular tachycardia: Secondary | ICD-10-CM

## 2020-04-04 DIAGNOSIS — E7849 Other hyperlipidemia: Secondary | ICD-10-CM

## 2020-04-04 DIAGNOSIS — I480 Paroxysmal atrial fibrillation: Secondary | ICD-10-CM

## 2020-04-04 DIAGNOSIS — Z79899 Other long term (current) drug therapy: Secondary | ICD-10-CM

## 2020-04-04 DIAGNOSIS — Z7901 Long term (current) use of anticoagulants: Secondary | ICD-10-CM

## 2020-04-04 DIAGNOSIS — I5022 Chronic systolic (congestive) heart failure: Secondary | ICD-10-CM

## 2020-04-04 DIAGNOSIS — I2510 Atherosclerotic heart disease of native coronary artery without angina pectoris: Secondary | ICD-10-CM

## 2020-04-09 ENCOUNTER — Telehealth: Payer: Self-pay | Admitting: *Deleted

## 2020-04-09 ENCOUNTER — Telehealth (INDEPENDENT_AMBULATORY_CARE_PROVIDER_SITE_OTHER): Payer: Medicare Other | Admitting: Orthopedic Surgery

## 2020-04-09 ENCOUNTER — Other Ambulatory Visit: Payer: Self-pay

## 2020-04-09 DIAGNOSIS — J449 Chronic obstructive pulmonary disease, unspecified: Secondary | ICD-10-CM | POA: Diagnosis not present

## 2020-04-09 DIAGNOSIS — R059 Cough, unspecified: Secondary | ICD-10-CM | POA: Diagnosis not present

## 2020-04-09 MED ORDER — SPIRIVA RESPIMAT 2.5 MCG/ACT IN AERS
2.0000 | INHALATION_SPRAY | Freq: Every day | RESPIRATORY_TRACT | 2 refills | Status: DC
Start: 2020-04-09 — End: 2021-06-17

## 2020-04-09 NOTE — Progress Notes (Signed)
This service is provided via telemedicine  No vital signs collected/recorded due to the encounter was a telemedicine visit.   Location of patient (ex: home, work):  Home  Patient consents to a telephone visit:  Yes  Location of the provider (ex: office, home):  Office  Name of any referring provider: none  Names of all persons participating in the telemedicine service and their role in the encounter:  Steven Guzman, CMA, Steven Guzman, patient, Steven Moulding, NP  Time spent on call:  Steven Guzman May, CMA  9:47   Location:   Therapist, nutritional of Service:   [place] Provider:  Windell Guzman, AGNP-C  Steven Guzman, Steven American, NP  Patient Care Team: Steven Chandler, NP as PCP - General (Geriatric Medicine) Steven Crome, MD as PCP - Cardiology (Cardiology) Steven Lance, MD as PCP - Electrophysiology (Cardiology) Steven Cluck, MD as Referring Physician (Ophthalmology) Steven Sine, MD as Consulting Physician (Dermatology)  Extended Emergency Contact Information Primary Emergency Contact: Steven Guzman,Steven Guzman Address: [address] [place], [place] [address] [place] Phone: [phone] Mobile Phone: [phone] Relation: Spouse Secondary Emergency Contact: Steven Guzman, Steven Guzman [address] [place] Phone: [phone] Mobile Phone: [phone] Relation: Relative  Goals of care: Advanced Directive information Advanced Directives 03/01/2020  Does Patient Have a Medical Advance Directive? No  Does patient want to make changes to medical advance directive? -  Would patient like information on creating a medical advance directive? No - Patient declined     Chief Complaint  Patient presents with  . Medical Management of Chronic Issues    Medical Management, to discuss Medications. Wants to discuss the Spriva. Sent Message to Pulmonary in November for an appointment and haven't heard  anything from them. Pulmonary Dr. Lynnda Guzman not refill medication without an appointment. Patient has been trying to get appointment but everytime he calls they don't have a schedule. ([place] pulmonary. Now has an appointment scheduled for [facility], Dr. Mariana Guzman in May.     HPI:  Pt is a 80 y.o. male seen today via telephone to discuss prescription medications.   He was seen last month for COPD exacerbation 12/14. Given prednisone dose pack and antibiotics, he states he feels much better. Denies sob or chest pain. Denies smoking. States he has quit smoking since Thanksgiving 2021. Wife and him quit together. He purchased a pulse oximeter for home. Averages 90-95 percent.    Today, he is mainly calling about his Spiriva. He is about to run out in 15 days. Tried to call his pulmonologist, but had issues with front office. Upset he had to schedule appointment to get Spiriva refilled. He has called numerous times in past to schedule appointment, but has been told " the schedule for next month has not been completed, your on a list and we will call back to schedule." Since he was frustrated with his pulmonary office, he has decided to switch pulmonary providers. He is scheduled to see Dr. Mariana Guzman in May. States Dr. Mariana Guzman specializes in patients with COPD, like him.  He is also seeking advise on what to do about Spiriva. Asking if we can manage his pulmonary issues while he waits to be seen by another provider.   Received covid booster in October 2021.   Last CT chest lung  cancer screening done 09/2015. Advised to repeat scan in 1 year, never done.    Past Medical History:  Diagnosis Date  . AICD (automatic cardioverter/defibrillator) present   . Aneurysm ([place])    a. Aneurysmal infrarenal aorta up to 33 mm on CT 10/2014, recommended f/u due 10/2017  . Anginal pain ([place])   . Anxiety   . Basal cell carcinoma of nose    S/P MOHS  . Biliary acute pancreatitis   . CAD (coronary artery disease)    a.  s/p MI in 1994 with PCI to LAD at that time b. cath 10/2012 demonstrated EF 30%, inferior akinesis with mild hypokinesis of all walls, patent LAD and RCA stents; ostial PDA with 80-90% obstruction with medical therapy recommended   . Chronic systolic CHF (congestive heart failure) (HCC)    EF 30 to 35 % as of 09/2014.   [name] CKD (chronic kidney disease), stage III ([place])   . Complication of anesthesia 10/2014   "had to have defibrillator w/ERCP"  . COPD (chronic obstructive pulmonary disease) ([place])    a. followed by pulmonary, COPD GOLD stage II  . Depression   . Diverticulosis of colon 07/2014   noted on CT  . GERD (gastroesophageal reflux disease)   . Hiatal hernia   . Hyperglycemia 10/2012.  [name] Hyperlipidemia   . Hypertension   . Myocardial infarction [facility]) 1994; 2011  . Pneumonia 1946; 2015  . Prostate enlargement 07/2014   observed on CT  . Tobacco abuse   . Ventricular tachycardia ([place])    a. 08/2009 s/p BSX E110 Teligen 100 AICD, ser#: [id number];  b. 08/2008 VT req ATP - detection reprogrammed from 160 to 150. c. EPS and VT ablation by Dr. Lovena Guzman 12/21/2014   Past Surgical History:  Procedure Laterality Date  . BIOPSY  12/21/2017   Procedure: BIOPSY;  Surgeon: Steven Shipper, MD;  Location: [facility];  Service: Endoscopy;;  . CATARACT EXTRACTION W/ INTRAOCULAR LENS  IMPLANT, BILATERAL Bilateral ~ 2011  . COLONOSCOPY    . COLONOSCOPY WITH PROPOFOL N/A 12/21/2017   Procedure: COLONOSCOPY WITH PROPOFOL;  Surgeon: Steven Shipper, MD;  Location: [facility];  Service: Endoscopy;  Laterality: N/A;  . ELECTROPHYSIOLOGIC STUDY N/A 12/21/2014   Procedure: V Tach Ablation;  Surgeon: Steven Lance, MD;  Location: [facility];  Service: Cardiovascular;  Laterality: N/A;  . ERCP N/A 11/16/2014   Procedure: ENDOSCOPIC RETROGRADE CHOLANGIOPANCREATOGRAPHY (ERCP);  Surgeon: Steven Castle, MD;  Location: [facility];  Service: Endoscopy;  Laterality: N/A;  . ESOPHAGOGASTRODUODENOSCOPY (EGD) WITH  PROPOFOL N/A 12/21/2017   Procedure: ESOPHAGOGASTRODUODENOSCOPY (EGD) WITH PROPOFOL;  Surgeon: Steven Shipper, MD;  Location: [facility];  Service: Endoscopy;  Laterality: N/A;  . EYE SURGERY    . FOOT SURGERY Left 2005   "fixed bone that stuck out in my ankle area"  . HEMORRHOID BANDING    . IMPLANTABLE CARDIOVERTER DEFIBRILLATOR IMPLANT  09/06/09   BSX dual chamber ICD implanted in [place] for cardiac arrest 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  . INGUINAL HERNIA REPAIR Right ~ 1995  . LEFT HEART CATHETERIZATION WITH CORONARY ANGIOGRAM N/A 11/25/2012   demonstrated EF 30%, inferior akinesis with mild hypokinesis of all walls, patent LAD and RCA stents; ostial PDA with 80-90% obstruction with medical therapy recommended  . MOHS SURGERY  2008   nose, skin graft  . POLYPECTOMY  12/21/2017   Procedure: POLYPECTOMY;  Surgeon: Steven Shipper, MD;  Location: [facility];  Service: Endoscopy;;  .  RETINAL DETACHMENT SURGERY Right 2013  . TENOLYSIS Right 12/21/2013   Procedure: TENOLYSIS FLEXOR CARPI RADIALIS ,DEBRIDEMENT RIGHT JOINT WRIST,DEBRIDEMENT SCAPHOTRAPEZIAL TRAPEZOID, REPAIR OF EXTENSOR HOOD;  Surgeon: Daryll Brod, MD;  Location: [facility];  Service: Orthopedics;  Laterality: Right;  . TOE SURGERY Right 09/2019   3rd toe/hammer toe  . V-TACH ABLATION  12/21/2014  . VIDEO BRONCHOSCOPY Bilateral 01/09/2016   Procedure: VIDEO BRONCHOSCOPY WITHOUT FLUORO;  Surgeon: Juanito Doom, MD;  Location: [facility];  Service: Cardiopulmonary;  Laterality: Bilateral;    Allergies  Allergen Reactions  . Sulfa Antibiotics Hives    Outpatient Encounter Medications as of 04/09/2020  Medication Sig  . albuterol (PROVENTIL) (2.5 MG/3ML) 0.083% nebulizer solution USE 1 VIAL VIA NEBULIZER EVERY 6 HOURS AS NEEDED FOR WHEEZING OR SHORTNESS OF BREATH  . amiodarone (PACERONE) 200 MG tablet TAKE 1 TABLET BY MOUTH EVERY DAY  . apixaban (ELIQUIS) 5 MG TABS tablet Take 1 tablet (5 mg total) by mouth 2 (two)  times daily.  . Artificial Tear Solution (SOOTHE XP OP) Place 2 drops into both eyes daily as needed (dry eyes).  [name] aspirin EC 81 MG tablet Take 1 tablet (81 mg total) by mouth every Monday, Wednesday, and Friday.  [name] atorvastatin (LIPITOR) 80 MG tablet TAKE 1 TABLET(80 MG) BY MOUTH DAILY  . benazepril (LOTENSIN) 10 MG tablet Take 5 mg by mouth daily.  [name] buPROPion (WELLBUTRIN) 75 MG tablet TAKE 1 TABLET BY MOUTH EVERY DAY  . busPIRone (BUSPAR) 15 MG tablet Take 15 mg by mouth 2 (two) times daily.  . carvedilol (COREG) 3.125 MG tablet TAKE 1 TABLET BY MOUTH TWICE A DAY  . Choline Fenofibrate (FENOFIBRIC ACID) 135 MG CPDR TAKE 1 CAPSULE BY MOUTH DAILY  . escitalopram (LEXAPRO) 20 MG tablet TAKE 1 TABLET BY MOUTH EVERY DAY  . fluticasone (FLONASE) 50 MCG/ACT nasal spray Place 2 sprays into both nostrils as needed.   . furosemide (LASIX) 40 MG tablet TAKE 1 TABLET(20 MG) BY MOUTH DAILY for 3 days, then resume 1/2 tablet daily  . guaiFENesin (MUCINEX) 600 MG 12 hr tablet Take 600 mg by mouth 2 (two) times daily.  [name] HYDROcodone-acetaminophen (NORCO/VICODIN) 5-325 MG tablet Take 1 tablet by mouth every 4 (four) hours as needed for moderate pain.  . Ipratropium-Albuterol (COMBIVENT RESPIMAT) 20-100 MCG/ACT AERS respimat Inhale 1 puff into the lungs every 6 (six) hours. Shortness of breath or wheezing  . IRON PO Take by mouth daily.   [name] MAGNESIUM-OXIDE 400 (241.3 Mg) MG tablet TAKE 1 TABSULE BY MOUTH DAILY  . mexiletine (MEXITIL) 200 MG capsule TAKE 1 CAPSULE(200 MG) BY MOUTH TWICE DAILY  . Multiple Vitamins-Minerals (CENTRUM ADULTS PO) Take by mouth daily.  . nitroGLYCERIN (NITROSTAT) 0.4 MG SL tablet DISSOLVE 1 TABLET UNDER THE TONGUE EVERY 5 MINUTES FOR 3 DOSES AS NEEDED FOR CHEST PAIN  . omeprazole (PRILOSEC) 20 MG capsule Take 20 mg by mouth daily.   . potassium chloride SA (KLOR-CON M20) 20 MEQ tablet TAKE 1 TABLET BY MOUTH EVERY DAY  . Respiratory Therapy Supplies (FLUTTER) DEVI Use as directed   . Sennosides-Docusate Sodium (STOOL SOFTENER/LAXATIVE PO) Take 2 tablets by mouth daily.  . simethicone (GAS-X) 80 MG chewable tablet Chew 1 tablet (80 mg total) by mouth every 6 (six) hours as needed for flatulence.  [name] Spacer/Aero-Holding Chambers (OPTICHAMBER DIAMOND) MISC optichamber VHC  . SPIRIVA RESPIMAT 2.5 MCG/ACT AERS INHALE 2 PUFFS INTO THE LUNGS DAILY  . SYMBICORT 160-4.5 MCG/ACT inhaler INHALE 2  PUFFS BY MOUTH TWICE A DAY  . tamsulosin (FLOMAX) 0.4 MG CAPS capsule TAKE 1 CAPSULE(0.4 MG) BY MOUTH DAILY AFTER SUPPER  . vitamin B-12 (CYANOCOBALAMIN) 1000 MCG tablet Take 1 tablet (1,000 mcg total) by mouth daily.  . [DISCONTINUED] amoxicillin-clavulanate (AUGMENTIN) 875-125 MG tablet Take 1 tablet by mouth 2 (two) times daily. (Patient not taking: Reported on 04/09/2020)  . [DISCONTINUED] predniSONE (DELTASONE) 10 MG tablet Take 1 tablet (10 mg total) by mouth daily with breakfast. Take 6 tablets by mouth for 2 days, take 5 tablets by mouth for 2 days, take 4 tablets by mouth for 2 days, take 3 tablets by mouth for 2 days, take 2 tablets by mouth for 2 days, take 1 tablet by mouth for 2 days (Patient not taking: Reported on 04/09/2020)   No facility-administered encounter medications on file as of 04/09/2020.    Review of Systems  Constitutional: Negative for fever.  Respiratory: Positive for cough and wheezing. Negative for shortness of breath.   Cardiovascular: Negative for chest pain, palpitations and leg swelling.    Immunization History  Administered Date(s) Administered  . Fluad Quad(high Dose 65+) 12/14/2018  . Influenza, High Dose Seasonal PF 12/17/2012, 01/06/2016, 12/21/2016, 12/18/2019  . Influenza,inj,Quad PF,6+ Mos 12/25/2013, 12/22/2014  . Influenza-Unspecified 12/28/2017, 12/18/2019  . PFIZER SARS-COV-2 Vaccination 04/19/2019, 05/10/2019, 01/17/2020  . Pneumococcal Conjugate-13 05/11/2014  . Pneumococcal-Unspecified 10/17/2010  . Tdap 05/11/2014  . Zoster 03/30/2008    Pertinent  Health Maintenance Due  Topic Date Due  . INFLUENZA VACCINE  Completed  . PNA vac Low Risk Adult  Completed   Fall Risk  03/01/2020 02/28/2020 02/05/2020 09/11/2019 03/16/2019  Falls in the past year? 1 1 0 0 1  Comment - - - - -  Number falls in past yr: 0 0 0 0 0  Comment 1 - - - -  Injury with Fall? (No Data) 0 0 0 1  Comment skin tears that blead in august 2021 - - - -  Risk for fall due to : - - - - -   Functional Status Survey:    There were no vitals filed for this visit. There is no height or weight on file to calculate BMI.   Physical Exam Pulmonary:     Comments: Dry coughing and hoarse voice heard during telephone conversation  : could not be performed due to telephone visit  Labs reviewed: Recent Labs    03/01/20 1344  NA 139  K 4.4  CL 104  CO2 25  GLUCOSE 84  BUN 11  CREATININE 1.08  CALCIUM 9.4   Recent Labs    03/01/20 1344  AST 34  ALT 45  BILITOT 0.6  PROT 6.4   Recent Labs    03/01/20 1344  WBC 5.8  NEUTROABS 3,921  HGB 13.2  HCT 39.4  MCV 99.2  PLT 185   Lab Results  Component Value Date   TSH 1.97 09/28/2018   Lab Results  Component Value Date   HGBA1C 5.6 12/29/2016   Lab Results  Component Value Date   CHOL 136 03/01/2020   HDL 54 03/01/2020   LDLCALC 67 03/01/2020   TRIG 71 03/01/2020   CHOLHDL 2.5 03/01/2020    Significant Diagnostic Results in last 30 days:  DG Ribs Unilateral W/Chest Left  Result Date: 03/18/2020 CLINICAL DATA:  80 year old male with fall. EXAM: LEFT RIBS AND CHEST - 3+ VIEW COMPARISON:  Chest radiograph dated 09/11/2019. FINDINGS: Minimal left lung base atelectasis. No focal consolidation,  pleural effusion or pneumothorax. The cardiac silhouette is within limits. Atherosclerotic calcification of the aortic arch. Left pectoral AICD device. Old healed fracture of lateral left eighth rib. There is a nondisplaced acute fracture of the anterolateral left ninth rib. IMPRESSION: Nondisplaced  acute fracture of the anterolateral left ninth rib. No pneumothorax. Electronically Signed   By: Anner Crete M.D.   On: 03/18/2020 16:00    Assessment/Plan 1. COPD mixed type ([place]) - he is in between pulmonary providers at this time - coughing heard over phone, suspect chronic bronchitis, he did not appear sob during conversation - he admits to smoking cessation, but smelled of cigarette odor last month - I have advised him to f/u with PCP in 1 month and until seen by new pulmonary specialist - last CT chest for lung cancer screening done in 2017, advised yearly - continue smoking cessation - continue spiriva regimen - continue albuterol and nebs prn - Tiotropium Bromide Monohydrate (SPIRIVA RESPIMAT) 2.5 MCG/ACT AERS; Inhale 2 puffs into the lungs daily.  Dispense: 4 g; Refill: 2 - CT CHEST LUNG CA SCREEN LOW DOSE W/O CM; Future   I connected withWilliam Guzman by telephoneand verified that I am speaking with the correct person using two identifiers.   Location: [place] Patient: Steven Guzman Provider: Windell Guzman, AGNP-C   I discussed the limitations, risks, security and privacy concerns of performing an evaluation and management service by telephone and the availability of in person appointments. I also discussed with the patient that there may be a patient responsible charge related to this service. The patient expressed understanding and agreed to proceed.    I discussed the assessment and treatment plan with the patient. The patient was provided an opportunity to ask questions and all were answered. The patient agreed with the plan and demonstrated an understanding of the instructions.    The patient was advised to call back or seek an in-person evaluation if the symptoms worsen or if the condition fails to improve as anticipated.   I provided 22 minutes of non-face-to-face time during this encounter.   Brindle Leyba Anibal Henderson  Avs printed and mailed   Family/  staff Communication: Plan discussed with patient, advised to follow up with PCP in 1 month  Labs/tests ordered:  CT chest lung cancer screening

## 2020-04-09 NOTE — Telephone Encounter (Signed)
Mr. maxxwell, edgett are scheduled for a virtual visit with your provider today.    Just as we do with appointments in the office, we must obtain your consent to participate.  Your consent will be active for this visit and any virtual visit you Kassiah Mccrory have with one of our providers in the next 365 days.    If you have a MyChart account, I can also send a copy of this consent to you electronically.  All virtual visits are billed to your insurance company just like a traditional visit in the office.  As this is a virtual visit, video technology does not allow for your provider to perform a traditional examination.  This Maitland Muhlbauer limit your provider's ability to fully assess your condition.  If your provider identifies any concerns that need to be evaluated in person or the need to arrange testing such as labs, EKG, etc, we will make arrangements to do so.    Although advances in technology are sophisticated, we cannot ensure that it will always work on either your end or our end.  If the connection with a video visit is poor, we Brownie Nehme have to switch to a telephone visit.  With either a video or telephone visit, we are not always able to ensure that we have a secure connection.   I need to obtain your verbal consent now.   Are you willing to proceed with your visit today?   EATON FOLMAR has provided verbal consent on 04/09/2020 for a virtual visit (video or telephone).   MayAlbertina [place], [place] 04/09/2020  10:39 AM

## 2020-04-12 ENCOUNTER — Other Ambulatory Visit: Payer: Self-pay | Admitting: Pulmonary Disease

## 2020-04-12 DIAGNOSIS — J432 Centrilobular emphysema: Secondary | ICD-10-CM

## 2020-04-12 NOTE — Addendum Note (Signed)
Addended byWindell Moulding E on: 04/12/2020 03:05 PM   Modules accepted: Orders

## 2020-04-17 ENCOUNTER — Other Ambulatory Visit: Payer: Self-pay | Admitting: Nurse Practitioner

## 2020-04-18 NOTE — Telephone Encounter (Signed)
Who has been previously prescribing it and why is he requesting a change in this

## 2020-04-18 NOTE — Telephone Encounter (Signed)
Patient is requesting refill on medication "Buspirone 15 mg". Patient has had this medication filled by another provider.Medication pend and sent to PCP [name], NP . Please Advise.

## 2020-04-19 NOTE — Telephone Encounter (Signed)
It just says "Historical Provider, MD".

## 2020-04-29 ENCOUNTER — Other Ambulatory Visit: Payer: Self-pay | Admitting: Neurosurgery

## 2020-04-29 DIAGNOSIS — M5134 Other intervertebral disc degeneration, thoracic region: Secondary | ICD-10-CM

## 2020-04-29 DIAGNOSIS — M5030 Other cervical disc degeneration, unspecified cervical region: Secondary | ICD-10-CM

## 2020-04-30 ENCOUNTER — Other Ambulatory Visit: Payer: 59

## 2020-04-30 ENCOUNTER — Telehealth: Payer: Self-pay

## 2020-05-01 ENCOUNTER — Ambulatory Visit
Admission: RE | Admit: 2020-05-01 | Discharge: 2020-05-01 | Disposition: A | Discharge status: Home / Self Care | Payer: Medicare Other | Source: Ambulatory Visit | Attending: Orthopedic Surgery | Admitting: Orthopedic Surgery

## 2020-05-01 ENCOUNTER — Other Ambulatory Visit: Payer: Self-pay

## 2020-05-01 DIAGNOSIS — R059 Cough, unspecified: Secondary | ICD-10-CM

## 2020-05-01 NOTE — Telephone Encounter (Signed)
Phone call to patient to verify medication list and allergies for myelogram procedure. Pt instructed to hold Wellbutrin, Buspar, and Lexapro for 48hrs prior to myelogram appointment time and 24 hours after appointment. Pt was also supposed to stop Mexitil 48 hours prior but pt stated "I do not feel comfortable stopping that, it is for my heart". Spoke to Dr. Jobe Igo and he approved for pt to stay on this medication for the myelogram procedure. Pt aware. Pt also instructed to have a driver the day of the procedure, the procedure would take around 2 hours, and discharge instructions discussed. Pt verbalized understanding.

## 2020-05-09 ENCOUNTER — Other Ambulatory Visit: Payer: Self-pay | Admitting: Pulmonary Disease

## 2020-05-09 DIAGNOSIS — J432 Centrilobular emphysema: Secondary | ICD-10-CM

## 2020-05-10 ENCOUNTER — Encounter: Payer: Self-pay | Admitting: Orthopedic Surgery

## 2020-05-10 ENCOUNTER — Ambulatory Visit: Payer: Medicare Other | Admitting: Orthopedic Surgery

## 2020-05-10 ENCOUNTER — Other Ambulatory Visit: Payer: Self-pay

## 2020-05-10 VITALS — BP 110/80 | HR 70 | Temp 97.9°F | Resp 20 | Ht 72.0 in | Wt 214.4 lb

## 2020-05-10 DIAGNOSIS — J432 Centrilobular emphysema: Secondary | ICD-10-CM | POA: Diagnosis not present

## 2020-05-10 DIAGNOSIS — J449 Chronic obstructive pulmonary disease, unspecified: Secondary | ICD-10-CM

## 2020-05-10 DIAGNOSIS — J411 Mucopurulent chronic bronchitis: Secondary | ICD-10-CM | POA: Diagnosis not present

## 2020-05-10 MED ORDER — COMBIVENT RESPIMAT 20-100 MCG/ACT IN AERS: 1.0000 | INHALATION_SPRAY | Freq: Four times a day (QID) | RESPIRATORY_TRACT | 5 refills | Status: DC

## 2020-05-10 MED ORDER — BUDESONIDE-FORMOTEROL FUMARATE 160-4.5 MCG/ACT IN AERO: 2.0000 | INHALATION_SPRAY | Freq: Two times a day (BID) | RESPIRATORY_TRACT | 2 refills | Status: DC

## 2020-05-10 NOTE — Progress Notes (Signed)
Location:   [place]:   Plain Provider:  [name], AGNP-C  [name], [name], NP  Patient Care Team: [name], NP as PCP - General (Geriatric Medicine) Belva Crome, MD as PCP - Cardiology (Cardiology) [name], MD as PCP - Electrophysiology (Cardiology) Roel Cluck, MD as Referring Physician (Ophthalmology) Harriett Sine, MD as Consulting Physician (Dermatology)  Extended Emergency Contact Information Primary Emergency Contact: Keysor,[name] Address: [place], [place] [address] [place] Phone: [phone] Mobile Phone: [phone] Relation: Spouse Secondary Emergency Contact: [name],  [address] [name] Phone: [phone] Mobile Phone: [phone] Relation: Relative  Goals of care: Advanced Directive information Advanced Directives 05/10/2020  Does Patient Have a Medical Advance Directive? No  Does patient want to make changes to medical advance directive? -  Would patient like information on creating a medical advance directive? -     Chief Complaint  Patient presents with  . Acute Visit    Follow Up    HPI:  Pt is a 80 y.o. Guzman seen today for medical management of chronic conditions.   CT results reviewed with patient.   He believes his breathing is worse. States pulse oximeter averages in low 90's. Bending over and exertion make him short of breath. He has his albuterol nebulizer, but not currently using it. Still taking Symbicort and Spiriva daily. Needs refill of Spiriva. Requesting rescue inhaler because he does not like doing nebulizer. He is also doing purse-lipped breathing to help with extra C02 buildup. Taking 600 mg guaifenesin twice daily for productive cough in AM. Uses incentive spirometer daily to prevent pneumonia.   Reports he is smoke free since December. Wife  has also stopped smoking too.   Still using albuterol- states he needs a new one.   Plans to see new pulmonary specialist in early May 2022.    Past Medical History:  Diagnosis Date  . AICD (automatic cardioverter/defibrillator) present   . Aneurysm ([place])    a. Aneurysmal infrarenal aorta up to 33 mm on CT 10/2014, recommended f/u due 10/2017  . Anginal pain ([place])   . Anxiety   . Basal cell carcinoma of nose    S/P MOHS  . Biliary acute pancreatitis   . CAD (coronary artery disease)    a. s/p MI in 1994 with PCI to LAD at that time b. cath 10/2012 demonstrated EF 30%, inferior akinesis with mild hypokinesis of all walls, patent LAD and RCA stents; ostial PDA with 80-90% obstruction with medical therapy recommended   . Chronic systolic CHF (congestive heart failure) (HCC)    EF 30 to 35 % as of 09/2014.   [name] CKD (chronic kidney disease), stage III ([place])   . Complication of anesthesia 10/2014   "had to have defibrillator w/ERCP"  . COPD (chronic obstructive pulmonary disease) ([place])    a. followed by pulmonary, COPD GOLD stage II  . Depression   . Diverticulosis of colon 07/2014   noted on CT  . GERD (gastroesophageal reflux disease)   . Hiatal hernia   . Hyperglycemia 10/2012.  [name] Hyperlipidemia   . Hypertension   . Myocardial infarction [facility]) 1994; 2011  . Pneumonia 1946; 2015  . Prostate enlargement 07/2014   observed on  CT  . Tobacco abuse   . Ventricular tachycardia ([place])    a. 08/2009 s/p BSX E110 Teligen 100 AICD, ser#: [id number];  b. 08/2008 VT req ATP - detection reprogrammed from 160 to 150. c. EPS and VT ablation by Dr. Lovena Le 12/21/2014   Past Surgical History:  Procedure Laterality Date  . BIOPSY  12/21/2017   Procedure: BIOPSY;  Surgeon: [name], MD;  Location: [facility];  Service: Endoscopy;;  . CATARACT EXTRACTION W/ INTRAOCULAR LENS  IMPLANT, BILATERAL Bilateral ~ 2011  . COLONOSCOPY    . COLONOSCOPY WITH PROPOFOL N/A 12/21/2017   Procedure: COLONOSCOPY WITH  PROPOFOL;  Surgeon: [name], MD;  Location: [facility];  Service: Endoscopy;  Laterality: N/A;  . ELECTROPHYSIOLOGIC STUDY N/A 12/21/2014   Procedure: V Tach Ablation;  Surgeon: [name], MD;  Location: [facility];  Service: Cardiovascular;  Laterality: N/A;  . ERCP N/A 11/16/2014   Procedure: ENDOSCOPIC RETROGRADE CHOLANGIOPANCREATOGRAPHY (ERCP);  Surgeon: [name], MD;  Location: [facility];  Service: Endoscopy;  Laterality: N/A;  . ESOPHAGOGASTRODUODENOSCOPY (EGD) WITH PROPOFOL N/A 12/21/2017   Procedure: ESOPHAGOGASTRODUODENOSCOPY (EGD) WITH PROPOFOL;  Surgeon: [name], MD;  Location: [facility];  Service: Endoscopy;  Laterality: N/A;  . EYE SURGERY    . FOOT SURGERY Left 2005   "fixed bone that stuck out in my ankle area"  . HEMORRHOID BANDING    . IMPLANTABLE CARDIOVERTER DEFIBRILLATOR IMPLANT  09/06/09   BSX dual chamber ICD implanted in [place] for cardiac arrest 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  . INGUINAL HERNIA REPAIR Right ~ 1995  . LEFT HEART CATHETERIZATION WITH CORONARY ANGIOGRAM N/A 11/25/2012   demonstrated EF 30%, inferior akinesis with mild hypokinesis of all walls, patent LAD and RCA stents; ostial PDA with 80-90% obstruction with medical therapy recommended  . MOHS SURGERY  2008   nose, skin graft  . POLYPECTOMY  12/21/2017   Procedure: POLYPECTOMY;  Surgeon: [name], MD;  Location: [facility];  Service: Endoscopy;;  . RETINAL DETACHMENT SURGERY Right 2013  . TENOLYSIS Right 12/21/2013   Procedure: TENOLYSIS FLEXOR CARPI RADIALIS ,DEBRIDEMENT RIGHT JOINT WRIST,DEBRIDEMENT SCAPHOTRAPEZIAL TRAPEZOID, REPAIR OF EXTENSOR HOOD;  Surgeon: Daryll Brod, MD;  Location: [facility];  Service: Orthopedics;  Laterality: Right;  . TOE SURGERY Right 09/2019   3rd toe/hammer toe  . V-TACH ABLATION  12/21/2014  . VIDEO BRONCHOSCOPY Bilateral 01/09/2016   Procedure: VIDEO BRONCHOSCOPY WITHOUT FLUORO;  Surgeon: Juanito Doom, MD;   Location: [facility];  Service: Cardiopulmonary;  Laterality: Bilateral;    Allergies  Allergen Reactions  . Sulfa Antibiotics Hives    Outpatient Encounter Medications as of 05/10/2020  Medication Sig  . albuterol (PROVENTIL) (2.5 MG/3ML) 0.083% nebulizer solution USE 1 VIAL VIA NEBULIZER EVERY 6 HOURS AS NEEDED FOR WHEEZING OR SHORTNESS OF BREATH  . amiodarone (PACERONE) 200 MG tablet TAKE 1 TABLET BY MOUTH EVERY DAY  . apixaban (ELIQUIS) 5 MG TABS tablet Take 1 tablet (5 mg total) by mouth 2 (two) times daily.  . Artificial Tear Solution (SOOTHE XP OP) Place 2 drops into both eyes daily as needed (dry eyes).  [name] aspirin EC 81 MG tablet Take 1 tablet (81 mg total) by mouth every Monday, Wednesday, and Friday.  [name] atorvastatin (LIPITOR) 80 MG tablet TAKE 1 TABLET(80 MG) BY MOUTH DAILY  . benazepril (LOTENSIN) 10 MG tablet Take 5 mg by mouth daily.  [name] buPROPion (WELLBUTRIN) 75 MG tablet TAKE 1 TABLET BY MOUTH  EVERY DAY  . busPIRone (BUSPAR) 15 MG tablet Take 1 tablet (15 mg total) by mouth 2 (two) times daily.  . carvedilol (COREG) 3.125 MG tablet TAKE 1 TABLET BY MOUTH TWICE A DAY  . Choline Fenofibrate (FENOFIBRIC ACID) 135 MG CPDR TAKE 1 CAPSULE BY MOUTH DAILY  . escitalopram (LEXAPRO) 20 MG tablet TAKE 1 TABLET BY MOUTH EVERY DAY  . fluticasone (FLONASE) 50 MCG/ACT nasal spray Place 2 sprays into both nostrils as needed.   . furosemide (LASIX) 40 MG tablet TAKE 1 TABLET(20 MG) BY MOUTH DAILY for 3 days, then resume 1/2 tablet daily  . guaiFENesin (MUCINEX) 600 MG 12 hr tablet Take 600 mg by mouth 2 (two) times daily.  [name] HYDROcodone-acetaminophen (NORCO/VICODIN) 5-325 MG tablet Take 1 tablet by mouth every 4 (four) hours as needed for moderate pain.  . Ipratropium-Albuterol (COMBIVENT RESPIMAT) 20-100 MCG/ACT AERS respimat Inhale 1 puff into the lungs every 6 (six) hours. Shortness of breath or wheezing  . IRON PO Take by mouth daily.   [name] MAGNESIUM-OXIDE 400 (241.3 Mg) MG tablet TAKE 1  TABSULE BY MOUTH DAILY  . mexiletine (MEXITIL) 200 MG capsule TAKE 1 CAPSULE(200 MG) BY MOUTH TWICE DAILY  . Multiple Vitamins-Minerals (CENTRUM ADULTS PO) Take by mouth daily.  . nitroGLYCERIN (NITROSTAT) 0.4 MG SL tablet DISSOLVE 1 TABLET UNDER THE TONGUE EVERY 5 MINUTES FOR 3 DOSES AS NEEDED FOR CHEST PAIN  . omeprazole (PRILOSEC) 20 MG capsule Take 20 mg by mouth daily.   . potassium chloride SA (KLOR-CON M20) 20 MEQ tablet TAKE 1 TABLET BY MOUTH EVERY DAY  . Respiratory Therapy Supplies (FLUTTER) DEVI Use as directed  . Sennosides-Docusate Sodium (STOOL SOFTENER/LAXATIVE PO) Take 2 tablets by mouth daily.  . simethicone (GAS-X) 80 MG chewable tablet Chew 1 tablet (80 mg total) by mouth every 6 (six) hours as needed for flatulence.  [name] Spacer/Aero-Holding Chambers (OPTICHAMBER DIAMOND) MISC optichamber VHC  . SYMBICORT 160-4.5 MCG/ACT inhaler INHALE 2 PUFFS BY MOUTH TWICE A DAY  . tamsulosin (FLOMAX) 0.4 MG CAPS capsule TAKE 1 CAPSULE(0.4 MG) BY MOUTH DAILY AFTER SUPPER  . Tiotropium Bromide Monohydrate (SPIRIVA RESPIMAT) 2.5 MCG/ACT AERS Inhale 2 puffs into the lungs daily.  . vitamin B-12 (CYANOCOBALAMIN) 1000 MCG tablet Take 1 tablet (1,000 mcg total) by mouth daily.   No facility-administered encounter medications on file as of 05/10/2020.    Review of Systems  Constitutional: Negative for activity change, appetite change and fever.  HENT: Negative for congestion.   Respiratory: Positive for cough and shortness of breath. Negative for wheezing.        Productive cough  Cardiovascular: Negative for chest pain and leg swelling.  Neurological: Negative for dizziness, weakness and headaches.  Hematological: Bruises/bleeds easily.  Psychiatric/Behavioral: Negative for dysphoric mood. The patient is not nervous/anxious.     Immunization History  Administered Date(s) Administered  . Fluad Quad(high Dose 65+) 12/14/2018  . Influenza, High Dose Seasonal PF 12/17/2012, 01/06/2016,  12/21/2016, 12/18/2019  . Influenza,inj,Quad PF,6+ Mos 12/25/2013, 12/22/2014  . Influenza,trivalent, recombinat, inj, PF 12/16/2017  . Influenza-Unspecified 12/28/2017, 12/18/2019  . PFIZER(Purple Top)SARS-COV-2 Vaccination 04/19/2019, 05/10/2019, 01/17/2020  . Pneumococcal Conjugate-13 05/11/2014  . Pneumococcal-Unspecified 10/17/2010  . Tdap 05/11/2014  . Zoster 03/30/2008   Pertinent  Health Maintenance Due  Topic Date Due  . INFLUENZA VACCINE  Completed  . PNA vac Low Risk Adult  Completed   Fall Risk  05/10/2020 03/01/2020 02/28/2020 02/05/2020 09/11/2019  Falls in the past year? 1 1 1  0 0  Comment - - - - -  Number falls in past yr: 0 0 0 0 0  Comment - 1 - - -  Injury with Fall? 0 (No Data) 0 0 0  Comment - skin tears that blead in august 2021 - - -  Risk for fall due to : - - - - -   Functional Status Survey:    Vitals:   05/10/20 1417  BP: 110/80  Pulse: 70  Resp: 20  Temp: 97.9 F (36.6 C)  TempSrc: Temporal  SpO2: 96%  Weight: 214 lb 6.4 oz (97.3 kg)  Height: 6' (1.829 m)   Body mass index is 29.08 kg/m. Physical Exam Vitals reviewed.  Constitutional:      General: He is not in acute distress. HENT:     Head: Normocephalic.  Cardiovascular:     Rate and Rhythm: Normal rate and regular rhythm.     Pulses: Normal pulses.     Heart sounds: Normal heart sounds. No murmur heard.   Pulmonary:     Effort: Pulmonary effort is normal. No respiratory distress.     Breath sounds: Rhonchi present. No wheezing.  Abdominal:     General: Bowel sounds are normal. There is no distension.     Palpations: Abdomen is soft.     Tenderness: There is no abdominal tenderness.  Skin:    General: Skin is warm and dry.     Capillary Refill: Capillary refill takes less than 2 seconds.  Neurological:     General: No focal deficit present.     Mental Status: He is alert and oriented to person, place, and time.  Psychiatric:        Mood and Affect: Mood normal.         Behavior: Behavior normal.     Labs reviewed: Recent Labs    03/01/20 1344  NA 139  K 4.4  CL 104  CO2 25  GLUCOSE 84  BUN 11  CREATININE 1.08  CALCIUM 9.4   Recent Labs    03/01/20 1344  AST 34  ALT 45  BILITOT 0.6  PROT 6.4   Recent Labs    03/01/20 1344  WBC 5.8  NEUTROABS 3,921  HGB 13.2  HCT 39.4  MCV 99.2  PLT 185   Lab Results  Component Value Date   TSH 1.97 09/28/2018   Lab Results  Component Value Date   HGBA1C 5.6 12/29/2016   Lab Results  Component Value Date   CHOL 136 03/01/2020   HDL 54 03/01/2020   LDLCALC 67 03/01/2020   TRIG 71 03/01/2020   CHOLHDL 2.5 03/01/2020    Significant Diagnostic Results in last 30 days:  CT Chest Wo Contrast  Result Date: 05/01/2020 CLINICAL DATA:  Persistent cough and shortness of breath EXAM: CT CHEST WITHOUT CONTRAST TECHNIQUE: Multidetector CT imaging of the chest was performed following the standard protocol without IV contrast. COMPARISON:  03/18/2020, CT from 12/17/2017 FINDINGS: Cardiovascular: Somewhat limited due to lack of IV contrast. Cardiac defibrillator is noted. Aortic calcifications are seen without aneurysmal dilatation. Coronary calcifications are noted. No cardiac enlargement is seen. Pulmonary artery as visualized appears within normal limits. Mediastinum/Nodes: Thoracic inlet is within normal limits. No sizable hilar or mediastinal adenopathy is noted. Sliding-type hiatal hernia is noted. Lungs/Pleura: Lungs are well aerated bilaterally. Emphysematous changes are seen with mild scarring bilaterally. No focal confluent infiltrate or sizable effusion is seen. No parenchymal nodules are noted. Upper Abdomen: Stable pneumobilia is noted within the left  lobe of the liver. Small adrenal lesions are noted bilaterally stable from a prior exam from 2019. No other focal abnormality is noted. Musculoskeletal: Degenerative changes of the thoracic spine are noted. Old healed rib fracture on the left is noted.  IMPRESSION: Emphysematous changes without acute infiltrate. Sliding-type hiatal hernia. Stable adrenal lesions bilaterally from 2019 consistent with benign etiology. Aortic Atherosclerosis (ICD10-I70.0) and Emphysema (ICD10-J43.9). Electronically Signed   By: Inez Catalina M.D.   On: 05/01/2020 22:32    Assessment/Plan 1. COPD mixed type ([place]) - CT chest reveals progressive emphysema - confirms why he is more sob at times - sats > 90, does not appear sob - cont symbicort and spiriva - cont albuterol nebs - will add respimat as rescue inhaler - cont f/u with future pulmonary specialist - Ipratropium-Albuterol (COMBIVENT RESPIMAT) 20-100 MCG/ACT AERS respimat; Inhale 1 puff into the lungs every 6 (six) hours. Shortness of breath or wheezing  Dispense: 4 g; Refill: 5  2. Centrilobular emphysema ([place]) - same as above - budesonide-formoterol (SYMBICORT) 160-4.5 MCG/ACT inhaler; Inhale 2 puffs into the lungs 2 (two) times daily.  Dispense: 10.2 each; Refill: 2  3. Mucopurulent chronic bronchitis (HCC) - productive cough in AM - cont mucinex 600 mg po bid to thin secretions  I provided 22 minutes of non-face-to-face time during this encounter    Family/ staff Communication: Plan discussed with patient  Labs/tests ordered:  none

## 2020-05-10 NOTE — Patient Instructions (Signed)
Respimat inhaler refilled- may take 2 puffs every 6 hours  Chronic Obstructive Pulmonary Disease  Chronic obstructive pulmonary disease (COPD) is a long-term (chronic) lung problem. When you have COPD, it is hard for air to get in and out of your lungs. Usually the condition gets worse over time, and your lungs will never return to normal. There are things you can do to keep yourself as healthy as possible. What are the causes?  Smoking. This is the most common cause.  Certain genes passed from parent to child (inherited). What increases the risk?  Being exposed to secondhand smoke from cigarettes, pipes, or cigars.  Being exposed to chemicals and other irritants, such as fumes and dust in the work environment.  Having chronic lung conditions or infections. What are the signs or symptoms?  Shortness of breath, especially during physical activity.  A long-term cough with a large amount of thick mucus. Sometimes, the cough may not have any mucus (dry cough).  Wheezing.  Breathing quickly.  Skin that looks gray or blue, especially in the fingers, toes, or lips.  Feeling tired (fatigue).  Weight loss.  Chest tightness.  Having infections often.  Episodes when breathing symptoms become much worse (exacerbations). At the later stages of this disease, you may have swelling in the ankles, feet, or legs. How is this treated?  Taking medicines.  Quitting smoking, if you smoke.  Rehabilitation. This includes steps to make your body work better. It may involve a team of specialists.  Doing exercises.  Making changes to your diet.  Using oxygen.  Lung surgery.  Lung transplant.  Comfort measures (palliative care). Follow these instructions at home: Medicines  Take over-the-counter and prescription medicines only as told by your doctor.  Talk to your doctor before taking any cough or allergy medicines. You may need to avoid medicines that cause your lungs to be  dry. Lifestyle  If you smoke, stop smoking. Smoking makes the problem worse.  Do not smoke or use any products that contain nicotine or tobacco. If you need help quitting, ask your doctor.  Avoid being around things that make your breathing worse. This may include smoke, chemicals, and fumes.  Stay active, but remember to rest as well.  Learn and use tips on how to manage stress and control your breathing.  Make sure you get enough sleep. Most adults need at least 7 hours of sleep every night.  Eat healthy foods. Eat smaller meals more often. Rest before meals. Controlled breathing Learn and use tips on how to control your breathing as told by your doctor. Try:  Breathing in (inhaling) through your nose for 1 second. Then, pucker your lips and breath out (exhale) through your lips for 2 seconds.  Putting one hand on your belly (abdomen). Breathe in slowly through your nose for 1 second. Your hand on your belly should move out. Pucker your lips and breathe out slowly through your lips. Your hand on your belly should move in as you breathe out.   Controlled coughing Learn and use controlled coughing to clear mucus from your lungs. Follow these steps: 1. Lean your head a little forward. 2. Breathe in deeply. 3. Try to hold your breath for 3 seconds. 4. Keep your mouth slightly open while coughing 2 times. 5. Spit any mucus out into a tissue. 6. Rest and do the steps again 1 or 2 times as needed. General instructions  Make sure you get all the shots (vaccines) that your doctor recommends.  Ask your doctor about a flu shot and a pneumonia shot.  Use oxygen therapy and pulmonary rehabilitation if told by your doctor. If you need home oxygen therapy, ask your doctor if you should buy a tool to measure your oxygen level (oximeter).  Make a COPD action plan with your doctor. This helps you to know what to do if you feel worse than usual.  Manage any other conditions you have as told by  your doctor.  Avoid going outside when it is very hot, cold, or humid.  Avoid people who have a sickness you can catch (contagious).  Keep all follow-up visits. Contact a doctor if:  You cough up more mucus than usual.  There is a change in the color or thickness of the mucus.  It is harder to breathe than usual.  Your breathing is faster than usual.  You have trouble sleeping.  You need to use your medicines more often than usual.  You have trouble doing your normal activities such as getting dressed or walking around the house. Get help right away if:  You have shortness of breath while resting.  You have shortness of breath that stops you from: ? Being able to talk. ? Doing normal activities.  Your chest hurts for longer than 5 minutes.  Your skin color is more blue than usual.  Your pulse oximeter shows that you have low oxygen for longer than 5 minutes.  You have a fever.  You feel too tired to breathe normally. These symptoms may represent a serious problem that is an emergency. Do not wait to see if the symptoms will go away. Get medical help right away. Call your local emergency services (911 in the [place] Do not drive yourself to the hospital. Summary  Chronic obstructive pulmonary disease (COPD) is a long-term lung problem.  The way your lungs work will never return to normal. Usually the condition gets worse over time. There are things you can do to keep yourself as healthy as possible.  Take over-the-counter and prescription medicines only as told by your doctor.  If you smoke, stop. Smoking makes the problem worse. This information is not intended to replace advice given to you by your health care provider. Make sure you discuss any questions you have with your health care provider. Document Revised: 01/23/2020 Document Reviewed: 01/23/2020 Elsevier Patient Education  2021 [employer].

## 2020-05-14 ENCOUNTER — Other Ambulatory Visit: Payer: Self-pay

## 2020-05-14 ENCOUNTER — Ambulatory Visit
Admission: RE | Admit: 2020-05-14 | Discharge: 2020-05-14 | Disposition: A | Discharge status: Home / Self Care | Payer: Medicare Other | Source: Ambulatory Visit | Attending: Neurosurgery | Admitting: Neurosurgery

## 2020-05-14 ENCOUNTER — Other Ambulatory Visit: Payer: Self-pay | Admitting: Neurosurgery

## 2020-05-14 DIAGNOSIS — M5134 Other intervertebral disc degeneration, thoracic region: Secondary | ICD-10-CM

## 2020-05-14 DIAGNOSIS — M5030 Other cervical disc degeneration, unspecified cervical region: Secondary | ICD-10-CM

## 2020-05-14 MED ORDER — DIAZEPAM 5 MG PO TABS: 5.0000 mg | ORAL_TABLET | Freq: Once | ORAL | Status: DC

## 2020-05-14 NOTE — Discharge Instructions (Signed)
Myelogram Discharge Instructions  1. Go home and rest quietly for the next 24 hours.  It is important to lie flat for the next 24 hours.  Get up only to go to the restroom.  You may lie in the bed or on a couch on your back, your stomach, your left side or your right side.  You may have one pillow under your head.  You may have pillows between your knees while you are on your side or under your knees while you are on your back.  2. DO NOT drive today.  Recline the seat as far back as it will go, while still wearing your seat belt, on the way home.  3. You may get up to go to the bathroom as needed.  You may sit up for 10 minutes to eat.  You may resume your normal diet and medications unless otherwise indicated.  Drink lots of extra fluids today and tomorrow.  4. The incidence of headache, nausea, or vomiting is about 5% (one in 20 patients).  If you develop a headache, lie flat and drink plenty of fluids until the headache goes away.  Caffeinated beverages may be helpful.  If you develop severe nausea and vomiting or a headache that does not go away with flat bed rest, call [phone].  5. You may resume normal activities after your 24 hours of bed rest is over; however, do not exert yourself strongly or do any heavy lifting tomorrow. If when you get up you have a headache when standing, go back to bed and force fluids for another 24 hours.  6. Call your physician for a follow-up appointment.  The results of your myelogram will be sent directly to your physician by the following day.  7. If you have any questions or if complications develop after you arrive home, please call [phone].  Discharge instructions have been explained to the patient.  The patient, or the person responsible for the patient, fully understands these instructions  YOU MAY TAKE YOUR WELLBUTRIN, [place] ON 05/15/20 @ 10:30AM OR THEREAFTER.

## 2020-05-14 NOTE — Progress Notes (Signed)
Pt arrived for myelogram procedure today and was supposed to stop his Wellbutrin, Buspar, and Lexapro 48 hours prior. Pt reports "I forgot to stop them". Pt reports his last dose of these medications were yesterday 05/13/20. Spoke to Dr. Nelia Shi regarding this and he stated we needed to reschedule the patient. Tye [place], [place] scheduler, notified and will call pt to reschedule.

## 2020-05-19 ENCOUNTER — Other Ambulatory Visit: Payer: Self-pay | Admitting: Internal Medicine

## 2020-05-20 ENCOUNTER — Other Ambulatory Visit: Payer: Self-pay

## 2020-05-20 ENCOUNTER — Ambulatory Visit (INDEPENDENT_AMBULATORY_CARE_PROVIDER_SITE_OTHER): Payer: Medicare Other

## 2020-05-20 ENCOUNTER — Ambulatory Visit
Admission: RE | Admit: 2020-05-20 | Discharge: 2020-05-20 | Disposition: A | Discharge status: Home / Self Care | Payer: Medicare Other | Source: Ambulatory Visit | Attending: Neurosurgery | Admitting: Neurosurgery

## 2020-05-20 DIAGNOSIS — I472 Ventricular tachycardia, unspecified: Secondary | ICD-10-CM

## 2020-05-20 LAB — CUP PACEART REMOTE DEVICE CHECK
Battery Remaining Longevity: 18 mo
Battery Remaining Percentage: 25 %
Brady Statistic RA Percent Paced: 2 %
Brady Statistic RV Percent Paced: 0 %
Date Time Interrogation Session: 20220221075000
HighPow Impedance: 52 Ohm
Implantable Lead Implant Date: 20110610
Implantable Lead Implant Date: 20110610
Implantable Lead Location: 753859
Implantable Lead Location: 753860
Implantable Lead Model: 185
Implantable Lead Model: 4135
Implantable Lead Serial Number: 28681386
Implantable Lead Serial Number: 339643
Implantable Pulse Generator Implant Date: 20110610
Lead Channel Impedance Value: 447 Ohm
Lead Channel Impedance Value: 530 Ohm
Lead Channel Pacing Threshold Amplitude: 0.9 V
Lead Channel Pacing Threshold Amplitude: 1.1 V
Lead Channel Pacing Threshold Pulse Width: 0.4 ms
Lead Channel Pacing Threshold Pulse Width: 0.4 ms
Lead Channel Setting Pacing Amplitude: 2 V
Lead Channel Setting Pacing Amplitude: 2.2 V
Lead Channel Setting Pacing Pulse Width: 0.4 ms
Lead Channel Setting Sensing Sensitivity: 0.6 mV
Pulse Gen Serial Number: 164892

## 2020-05-20 MED ORDER — IOPAMIDOL (ISOVUE-M 300) INJECTION 61%
10.0000 mL | Freq: Once | INTRAMUSCULAR | Status: AC | PRN
  Administered 2020-05-20: 10 mL via INTRATHECAL

## 2020-05-20 MED ORDER — DIAZEPAM 5 MG PO TABS
5.0000 mg | ORAL_TABLET | Freq: Once | ORAL | Status: AC
  Administered 2020-05-20: 5 mg via ORAL

## 2020-05-20 NOTE — Progress Notes (Signed)
Pt reports he has been off of his Wellbutrin, Lexapro, and Buspar for at least 48 hours.

## 2020-05-20 NOTE — Discharge Instructions (Signed)
Myelogram Discharge Instructions  1. Go home and rest quietly for the next 24 hours.  It is important to lie flat for the next 24 hours.  Get up only to go to the restroom.  You may lie in the bed or on a couch on your back, your stomach, your left side or your right side.  You may have one pillow under your head.  You may have pillows between your knees while you are on your side or under your knees while you are on your back.  2. DO NOT drive today.  Recline the seat as far back as it will go, while still wearing your seat belt, on the way home.  3. You may get up to go to the bathroom as needed.  You may sit up for 10 minutes to eat.  You may resume your normal diet and medications unless otherwise indicated.  Drink lots of extra fluids today and tomorrow.  4. The incidence of headache, nausea, or vomiting is about 5% (one in 20 patients).  If you develop a headache, lie flat and drink plenty of fluids until the headache goes away.  Caffeinated beverages may be helpful.  If you develop severe nausea and vomiting or a headache that does not go away with flat bed rest, call [phone].  5. You may resume normal activities after your 24 hours of bed rest is over; however, do not exert yourself strongly or do any heavy lifting tomorrow. If when you get up you have a headache when standing, go back to bed and force fluids for another 24 hours.  6. Call your physician for a follow-up appointment.  The results of your myelogram will be sent directly to your physician by the following day.  7. If you have any questions or if complications develop after you arrive home, please call [phone].  Discharge instructions have been explained to the patient.  The patient, or the person responsible for the patient, fully understands these instructions       YOU MAY TAKE YOUR WELLBUTRIN, BUSPAR, AND LEXAPRO TOMORROW ON 05/21/20 @ 1PM OR THEREAFTER.

## 2020-05-22 ENCOUNTER — Telehealth: Payer: Self-pay | Admitting: *Deleted

## 2020-05-22 NOTE — Telephone Encounter (Signed)
   [facility] Pre-operative Risk Assessment    HEARTCARE STAFF: - Please ensure there is not already an duplicate clearance open for this procedure. - Under Visit Info/Reason for Call, type in Other and utilize the format Clearance MM/DD/YY or Clearance TBD. Do not use dashes or single digits. - If request is for dental extraction, please clarify the # of teeth to be extracted.  Request for surgical clearance:  1. What type of surgery is being performed? SPINAL INJECTIONS   2. When is this surgery scheduled? TBD   3. What type of clearance is required (medical clearance vs. Pharmacy clearance to hold med vs. Both)? BOTH  4. Are there any medications that need to be held prior to surgery and how long? ELIQUIS x  3 DAYS PRIOR TO INJECTION   5. Practice name and name of physician performing surgery? [facility]; DR. Lenord Carbo    6. What is the office phone number? [phone]   7.   What is the office fax number? [phone] ATTN: DR. DAVE EICHMAN  8.   Anesthesia type (None, local, MAC, general) ? NONE LISTED   Steven Guzman 05/22/2020, 5:11 PM  _________________________________________________________________   (provider comments below)

## 2020-05-23 NOTE — Telephone Encounter (Signed)
Clinical pharmacist to review Eliquis 

## 2020-05-27 NOTE — Telephone Encounter (Signed)
Patient with diagnosis of atrial fibrillation on Eliquis for anticoagulation.    Procedure: spinal injections Date of procedure: TBD   CHA2DS2-VASc Score = 5  This indicates a 7.2% annual risk of stroke. The patient's score is based upon: CHF History: Yes HTN History: Yes Diabetes History: No Stroke History: No Vascular Disease History: Yes Age Score: 2 Gender Score: 0   CrCl 73.8 Platelet count 185  Per office protocol, patient can hold Eliquis for 3 days prior to procedure.    Patient will not need bridging with Lovenox (enoxaparin) around procedure.

## 2020-05-27 NOTE — Progress Notes (Signed)
Remote ICD transmission.   

## 2020-05-27 NOTE — Telephone Encounter (Signed)
Left message for pt to call back. Richardson Dopp, PA-C    05/27/2020 9:57 AM

## 2020-05-28 NOTE — Telephone Encounter (Signed)
Looks like procedure has been set for 06/10/20

## 2020-05-30 NOTE — Telephone Encounter (Signed)
Requesting office has sent over another request.

## 2020-05-31 NOTE — Telephone Encounter (Signed)
   Primary Cardiologist: Sinclair Grooms, MD  Chart reviewed as part of pre-operative protocol coverage. Patient was contacted 05/31/2020 in reference to pre-operative risk assessment for pending surgery as outlined below.  Steven Guzman was last seen on 05/31/19 by Dr. Tamala Julian.  Since that day, Steven Guzman has done well. He reports no anginal symptoms.  He has upcoming appointment with EP 06/07/20 and in April with Dr. Tamala Julian.  Per pharmacy review he may hold Eliquis 3 days prior to planned procedure. He verbalized understanding of these instructions.   Therefore, based on ACC/AHA guidelines, the patient would be at acceptable risk for the planned procedure without further cardiovascular testing.   The patient was advised that if he develops new symptoms prior to surgery to contact our office to arrange for a follow-up visit, and he verbalized understanding.  I will route this recommendation to the requesting party via Epic fax function and remove from pre-op pool. Please call with questions.  Loel Dubonnet, NP 05/31/2020, 2:36 PM

## 2020-05-31 NOTE — Telephone Encounter (Signed)
   Primary Cardiologist: Sinclair Grooms, MD  Chart reviewed as part of pre-operative protocol coverage. Per pharmacy review Mr. FRANCISO DIERKS may hold Eliquis 3 days prior to planned procedure.   Called 05/31/20 at 11:10AM and left patient VM requesting call back to assess for any new cardiac symptoms. Will send MyChart message as well.   Loel Dubonnet, NP 05/31/2020, 11:05 AM

## 2020-06-03 NOTE — Telephone Encounter (Signed)
Our office received clearance request x 3 today for same procedure. I called and left message for surgery scheduler for Dr. Davy Pique our office did fax clearance 05/31/20 @ 2:40 pm. If any questions please call [phone].

## 2020-06-06 ENCOUNTER — Other Ambulatory Visit: Payer: Self-pay | Admitting: Nurse Practitioner

## 2020-06-06 ENCOUNTER — Other Ambulatory Visit: Payer: Self-pay | Admitting: Interventional Cardiology

## 2020-06-06 NOTE — Progress Notes (Unsigned)
Cardiology Office Note Date:  06/07/2020  Patient ID:  Steven Guzman, Steven Guzman Sep 16, 1940, MRN [id number] PCP:  [name], NP  Cardiologist:  Dr. Tamala Julian Electrophysiologist: Dr. Lovena Le    Chief Complaint: annual device visit  History of Present Illness: Steven Guzman is a 80 y.o. male with history of COPD, CAD (remote CABG, MI complicated by cardiac arrest 1994), VT, AFib, ICM, chronic CHF, , HTN, HLD, anxiety, CKD (III), PVCs  He comes in today to be seen for Dr. Lovena Le, last seen by him Feb 2021, had been a couple years then for a EP clinic visit,  He was doing OK< class II symptoms, still smoking Continued on amiodarone and mexilletine   More recently saw Dr. Tamala Julian March 2021, typically with lower BPs had episodic HTN episode associated with dizziness.  BP was OK,  planned for carotid US.  He is was recently cleared for planned spinal injection by pre-op pool and RPH addressed Eliquis as well.  TODAY He is doing OK. Has baseline SOB with his COPD that is unchanged. He reports a CP that tends to be R sided, random, and he thinks likely is GI or his lung.  No asscoaited symptoms or change in breathing, no radiation, lasts a minute perhaps.  Says he will go to get his s/l NTG and most often is gone by the time he gets the NTG, but will take it "just in case". He has had this for some years no, not changed in behavior. No dizzy spells, near syncope or syncope. No shocks No palpitations He has large areas of bruising arms especially, some lower legs as well.  No overt bleeding or signs of bleeding His spinal injection is Monday and today is 1st day off Eliquis.   Device information BSCi dual chamber ICD implanted 09/06/2009  Arrhythmia hx Dec 2015 PMVT with ICD w/HV therapies (in setting of missed meds) 2016 VT tx w/ATP  12/22/2014 EPS/ablation intereval notes report slow VT's observed, pace terminated in the office 07/21/2016 and in the hospital Jun 2018   AAD  Hx Amiodarone an active medicine at least since 2014 mexilletine started April 2018  Past Medical History:  Diagnosis Date  . AICD (automatic cardioverter/defibrillator) present   . Aneurysm ([place])    a. Aneurysmal infrarenal aorta up to 33 mm on CT 10/2014, recommended f/u due 10/2017  . Anginal pain ([place])   . Anxiety   . Basal cell carcinoma of nose    S/P MOHS  . Biliary acute pancreatitis   . CAD (coronary artery disease)    a. s/p MI in 1994 with PCI to LAD at that time b. cath 10/2012 demonstrated EF 30%, inferior akinesis with mild hypokinesis of all walls, patent LAD and RCA stents; ostial PDA with 80-90% obstruction with medical therapy recommended   . Chronic systolic CHF (congestive heart failure) (HCC)    EF 30 to 35 % as of 09/2014.   [name] CKD (chronic kidney disease), stage III ([place])   . Complication of anesthesia 10/2014   "had to have defibrillator w/ERCP"  . COPD (chronic obstructive pulmonary disease) ([place])    a. followed by pulmonary, COPD GOLD stage II  . Depression   . Diverticulosis of colon 07/2014   noted on CT  . GERD (gastroesophageal reflux disease)   . Hiatal hernia   . Hyperglycemia 10/2012.  [name] Hyperlipidemia   . Hypertension   . Myocardial infarction [facility]) 1994; 2011  . Pneumonia 1946; 2015  .  Prostate enlargement 07/2014   observed on CT  . Tobacco abuse   . Ventricular tachycardia ([place])    a. 08/2009 s/p BSX E110 Teligen 100 AICD, ser#: [id number];  b. 08/2008 VT req ATP - detection reprogrammed from 160 to 150. c. EPS and VT ablation by Dr. Lovena Le 12/21/2014    Past Surgical History:  Procedure Laterality Date  . BIOPSY  12/21/2017   Procedure: BIOPSY;  Surgeon: [name], MD;  Location: [facility];  Service: Endoscopy;;  . CATARACT EXTRACTION W/ INTRAOCULAR LENS  IMPLANT, BILATERAL Bilateral ~ 2011  . COLONOSCOPY    . COLONOSCOPY WITH PROPOFOL N/A 12/21/2017   Procedure: COLONOSCOPY WITH PROPOFOL;  Surgeon: [name], MD;  Location: [facility];   Service: Endoscopy;  Laterality: N/A;  . ELECTROPHYSIOLOGIC STUDY N/A 12/21/2014   Procedure: V Tach Ablation;  Surgeon: [name], MD;  Location: [place] [facility];  Service: Cardiovascular;  Laterality: N/A;  . ERCP N/A 11/16/2014   Procedure: ENDOSCOPIC RETROGRADE CHOLANGIOPANCREATOGRAPHY (ERCP);  Surgeon: [name], MD;  Location: [facility];  Service: Endoscopy;  Laterality: N/A;  . ESOPHAGOGASTRODUODENOSCOPY (EGD) WITH PROPOFOL N/A 12/21/2017   Procedure: ESOPHAGOGASTRODUODENOSCOPY (EGD) WITH PROPOFOL;  Surgeon: [name], MD;  Location: [facility];  Service: Endoscopy;  Laterality: N/A;  . EYE SURGERY    . FOOT SURGERY Left 2005   "fixed bone that stuck out in my ankle area"  . HEMORRHOID BANDING    . IMPLANTABLE CARDIOVERTER DEFIBRILLATOR IMPLANT  09/06/09   BSX dual chamber ICD implanted in [place] for cardiac arrest 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  . INGUINAL HERNIA REPAIR Right ~ 1995  . LEFT HEART CATHETERIZATION WITH CORONARY ANGIOGRAM N/A 11/25/2012   demonstrated EF 30%, inferior akinesis with mild hypokinesis of all walls, patent LAD and RCA stents; ostial PDA with 80-90% obstruction with medical therapy recommended  . MOHS SURGERY  2008   nose, skin graft  . POLYPECTOMY  12/21/2017   Procedure: POLYPECTOMY;  Surgeon: [name], MD;  Location: [facility];  Service: Endoscopy;;  . RETINAL DETACHMENT SURGERY Right 2013  . TENOLYSIS Right 12/21/2013   Procedure: TENOLYSIS FLEXOR CARPI RADIALIS ,DEBRIDEMENT RIGHT JOINT WRIST,DEBRIDEMENT SCAPHOTRAPEZIAL TRAPEZOID, REPAIR OF EXTENSOR HOOD;  Surgeon: Daryll Brod, MD;  Location: [facility];  Service: Orthopedics;  Laterality: Right;  . TOE SURGERY Right 09/2019   3rd toe/hammer toe  . V-TACH ABLATION  12/21/2014  . VIDEO BRONCHOSCOPY Bilateral 01/09/2016   Procedure: VIDEO BRONCHOSCOPY WITHOUT FLUORO;  Surgeon: Juanito Doom, MD;  Location: [facility];  Service: Cardiopulmonary;  Laterality:  Bilateral;    Current Outpatient Medications  Medication Sig Dispense Refill  . acetaminophen (TYLENOL) 500 MG tablet Take 2 tablets by mouth daily.    [name] albuterol (PROVENTIL) (2.5 MG/3ML) 0.083% nebulizer solution USE 1 VIAL VIA NEBULIZER EVERY 6 HOURS AS NEEDED FOR WHEEZING OR SHORTNESS OF BREATH 360 mL 0  . amiodarone (PACERONE) 200 MG tablet TAKE 1 TABLET BY MOUTH EVERY DAY 90 tablet 2  . apixaban (ELIQUIS) 5 MG TABS tablet Take 1 tablet (5 mg total) by mouth 2 (two) times daily. 60 tablet 11  . Artificial Tear Solution (SOOTHE XP OP) Place 2 drops into both eyes daily as needed (dry eyes).    [name] aspirin EC 81 MG tablet Take 1 tablet (81 mg total) by mouth every Monday, Wednesday, and Friday. 90 tablet 3  . atorvastatin (LIPITOR) 80 MG tablet TAKE 1 TABLET(80 MG) BY MOUTH DAILY 90  tablet 3  . benazepril (LOTENSIN) 10 MG tablet Take 5 mg by mouth daily.    . budesonide-formoterol (SYMBICORT) 160-4.5 MCG/ACT inhaler Inhale 2 puffs into the lungs 2 (two) times daily. 10.2 each 2  . buPROPion (WELLBUTRIN) 75 MG tablet TAKE 1 TABLET BY MOUTH EVERY DAY 90 tablet 1  . busPIRone (BUSPAR) 15 MG tablet Take 1 tablet (15 mg total) by mouth 2 (two) times daily. 90 tablet 1  . carvedilol (COREG) 3.125 MG tablet TAKE 1 TABLET BY MOUTH TWICE A DAY 180 tablet 1  . cetirizine (ZYRTEC) 10 MG tablet Take 1 tablet by mouth daily as needed.    . Choline Fenofibrate (FENOFIBRIC ACID) 135 MG CPDR Take 1 tablet by mouth daily. Please keep upcoming appt in April 2022 with Dr. Tamala Julian before anymore refills. Thank you 90 capsule 0  . escitalopram (LEXAPRO) 20 MG tablet TAKE 1 TABLET BY MOUTH EVERY DAY 90 tablet 1  . fluticasone (FLONASE) 50 MCG/ACT nasal spray Place 2 sprays into both nostrils as needed.     . furosemide (LASIX) 40 MG tablet TAKE 1 TABLET(20 MG) BY MOUTH DAILY for 3 days, then resume 1/2 tablet daily 45 tablet 3  . guaiFENesin (MUCINEX) 600 MG 12 hr tablet Take 600 mg by mouth 2 (two) times daily.     [name] HYDROcodone-acetaminophen (NORCO/VICODIN) 5-325 MG tablet Take 1 tablet by mouth every 4 (four) hours as needed for moderate pain. 16 tablet 0  . Ipratropium-Albuterol (COMBIVENT RESPIMAT) 20-100 MCG/ACT AERS respimat Inhale 1 puff into the lungs every 6 (six) hours. Shortness of breath or wheezing 4 g 5  . IRON PO Take by mouth daily.     [name] MAGNESIUM-OXIDE 400 (241.3 Mg) MG tablet TAKE 1 TABSULE BY MOUTH DAILY 90 tablet 3  . mexiletine (MEXITIL) 200 MG capsule Take 1 capsule (200 mg total) by mouth 2 (two) times daily. 180 capsule 0  . Multiple Vitamins-Minerals (CENTRUM ADULTS PO) Take by mouth daily.    . nitroGLYCERIN (NITROSTAT) 0.4 MG SL tablet DISSOLVE 1 TABLET UNDER THE TONGUE EVERY 5 MINUTES FOR 3 DOSES AS NEEDED FOR CHEST PAIN 25 tablet 6  . omeprazole (PRILOSEC) 20 MG capsule Take 20 mg by mouth daily.     . potassium chloride SA (KLOR-CON M20) 20 MEQ tablet TAKE 1 TABLET BY MOUTH EVERY DAY 90 tablet 1  . Respiratory Therapy Supplies (FLUTTER) DEVI Use as directed 1 each 0  . Sennosides-Docusate Sodium (STOOL SOFTENER/LAXATIVE PO) Take 2 tablets by mouth daily.    Orlie Dakin Sodium (STOOL SOFTENER/LAXATIVE PO) Take by mouth.    . simethicone (GAS-X) 80 MG chewable tablet Chew 1 tablet (80 mg total) by mouth every 6 (six) hours as needed for flatulence. 30 tablet 0  . Spacer/Aero-Holding Chambers (OPTICHAMBER DIAMOND) MISC optichamber VHC 1 each 0  . tamsulosin (FLOMAX) 0.4 MG CAPS capsule TAKE 1 CAPSULE(0.4 MG) BY MOUTH DAILY AFTER SUPPER 90 capsule 1  . Tiotropium Bromide Monohydrate (SPIRIVA RESPIMAT) 2.5 MCG/ACT AERS Inhale 2 puffs into the lungs daily. 4 g 2  . vitamin B-12 (CYANOCOBALAMIN) 1000 MCG tablet Take 1 tablet (1,000 mcg total) by mouth daily. 30 tablet 3   No current facility-administered medications for this visit.    Allergies:   Sulfa antibiotics   Social History:  The patient  reports that he has quit smoking. His smoking use included cigarettes. He  has a 55.00 pack-year smoking history. He has never used smokeless tobacco. He reports current alcohol use. He reports  that he does not use drugs.   Family History:  The patient's family history includes CAD in his father and mother; Heart attack in his brother; Hypertension in his brother, father, and mother.  ROS:  Please see the history of present illness.    All other systems are reviewed and otherwise negative.   PHYSICAL EXAM:  VS:  BP 130/82   Pulse 72   Ht 6' (1.829 m)   Wt 213 lb 12.8 oz (97 kg)   SpO2 94%   BMI 29.00 kg/m  BMI: Body mass index is 29 kg/m. Well nourished, well developed, in no acute distress HEENT: normocephalic, atraumatic Neck: no JVD, carotid bruits or masses Cardiac:  RRR; no significant murmurs, no rubs, or gallops Lungs:  CTA b/l, no wheezing, rhonchi or rales Abd: soft, nontender MS: no deformity or atrophy Ext: chronic skin changes b/l with varicose veins b/l.  He has a dry, scab lateral L calf with some selling inferiorly, not warm, not painful;, no drainage. Skin: warm and dry, no rash Neuro:  No gross deficits appreciated Psych: euthymic mood, full affect  ICD site is stable, no tethering or discomfort   EKG:  Done today and reviewed by myself shows  SR 72bpm, inf Q waves, unchanged  Device interrogation done today and reviewed by myself:  Battery and lead measurements are good NSVT episodes Available EGMs are brief 1:1 AT No VT No therpaies  06/07/2019: Carotid US Summary:  Right Carotid: Velocities in the right ICA are consistent with a 1-39%  stenosis.         Non-hemodynamically significant plaque <50% noted in the  CCA. The         ECA appears >50% stenosed.   Left Carotid: Velocities in the left ICA are consistent with a 1-39%  stenosis.   Vertebrals: Left vertebral artery demonstrates antegrade flow. Right  vertebral        artery demonstrates suspected occlusion, unable to identify  flow.   Subclavians: Normal flow hemodynamics were seen in bilateral subclavian        arteries.     09/15/2016: TTE Study Conclusions  - Left ventricle: The cavity size was mildly dilated. There was  mild concentric hypertrophy. Systolic function was mildly to  moderately reduced. The estimated ejection fraction was in the  range of 40% to 45%. Hypokinesis of the basal-mid inferior,  inferoseptal, anteroseptal, and inferolateral myocardium Doppler  parameters are consistent with abnormal left ventricular  relaxation (grade 1 diastolic dysfunction). Doppler parameters  are consistent with indeterminate ventricular filling pressure.  - Aortic valve: Transvalvular velocity was within the normal range.  There was no stenosis. There was no regurgitation.  - Ascending aorta: The ascending aorta was mildly dilated.  - Mitral valve: There was trivial regurgitation.  - Left atrium: The atrium was severely dilated.  - Right ventricle: The cavity size was normal. Wall thickness was  normal. Systolic function was normal.  - Right atrium: The atrium was severely dilated.  - Atrial septum: No defect or patent foramen ovale was identified  by color flow Doppler.  - Tricuspid valve: There was no regurgitation.    12/22/2014: EPS/ablation Rapid ventricular pacing was carried out during the ablation procedure demonstrating VA dissociation at 600 ms, prior to ablation there was easily inducible ventricular tachycardia. There were 6 inducible and spontaneously occurring ventricular tachycardia, this had mostly superior axis and a right bundle branch block QRS morphology, although there was one inferior axis ventricular tachycardia with RBBB.  Three-dimensional electro anatomic mapping was carried out, following ablation of extensive septal scar, patient had no additional ventricular tachycardia. Conclusion: Apparent successful catheter ablation of ventricular tachycardia with encircling  lesions around the extensive scar on the left ventricular septum. Following ablation, there is no additional ventricular tachycardia.    Recent Labs: 03/01/2020: ALT 45; BUN 11; Creat 1.08; Hemoglobin 13.2; Platelets 185; Potassium 4.4; Sodium 139  03/01/2020: Cholesterol 136; HDL 54; LDL Cholesterol (Calc) 67; Total CHOL/HDL Ratio 2.5; Triglycerides 71   CrCl cannot be calculated (Patient's most recent lab result is older than the maximum 21 days allowed.).   Wt Readings from Last 3 Encounters:  06/07/20 213 lb 12.8 oz (97 kg)  05/10/20 214 lb 6.4 oz (97.3 kg)  03/18/20 208 lb (94.3 kg)     Other studies reviewed: Additional studies/records reviewed today include: summarized above  ASSESSMENT AND PLAN:  1. ICD     Intact function, no programming changes made  2. VT    Amiodarone and mexiletine    No VT noted by today's interrogation    Labs today  3. ICM 4. Chronic CHF     No symptoms or exam findings to suggest overt volume OL     On BB, ACE, diuretic     C/w Dr. Tamala Julian     Labs todaya  5. CAD     Atypical CP, no anginal sounding symptoms     On ASA, BB, statin     C/w Dr. Tamala Julian  6. Paroxysmal AFib     CHA2DS2Vasc is 4, on Eliquis, appropriately dosed     0% burden  7. Wound LLE      Does not appear overtly infected, scab in place, no drainage, heat.  Some swelling      Slow healing though reports injury happened about a month ago, recommend he see his PMD      He said he will get it looked at today   Disposition: F/u with Dr. Tamala Julian as scheduled, labs today, remotes as usual and in clinic with EP in a year, sooner if needed  Current medicines are reviewed at length with the patient today.  The patient did not have any concerns regarding medicines.  Venetia Night, PA-C 06/07/2020 3:07 PM     [address] [place] [address] [phone] (office)  [phone] (fax)

## 2020-06-07 ENCOUNTER — Emergency Department (HOSPITAL_BASED_OUTPATIENT_CLINIC_OR_DEPARTMENT_OTHER)
Admission: EM | Admit: 2020-06-07 | Discharge: 2020-06-07 | Disposition: A | Discharge status: Home / Self Care | Payer: Medicare Other | Attending: Emergency Medicine | Admitting: Emergency Medicine

## 2020-06-07 ENCOUNTER — Other Ambulatory Visit: Payer: Self-pay

## 2020-06-07 ENCOUNTER — Ambulatory Visit: Payer: Medicare Other | Admitting: Physician Assistant

## 2020-06-07 ENCOUNTER — Encounter: Payer: Self-pay | Admitting: Physician Assistant

## 2020-06-07 VITALS — BP 130/82 | HR 72 | Ht 72.0 in | Wt 213.8 lb

## 2020-06-07 DIAGNOSIS — Z87891 Personal history of nicotine dependence: Secondary | ICD-10-CM | POA: Insufficient documentation

## 2020-06-07 DIAGNOSIS — W228XXA Striking against or struck by other objects, initial encounter: Secondary | ICD-10-CM | POA: Insufficient documentation

## 2020-06-07 DIAGNOSIS — Z85828 Personal history of other malignant neoplasm of skin: Secondary | ICD-10-CM | POA: Diagnosis not present

## 2020-06-07 DIAGNOSIS — J449 Chronic obstructive pulmonary disease, unspecified: Secondary | ICD-10-CM | POA: Diagnosis not present

## 2020-06-07 DIAGNOSIS — I472 Ventricular tachycardia, unspecified: Secondary | ICD-10-CM

## 2020-06-07 DIAGNOSIS — Z95810 Presence of automatic (implantable) cardiac defibrillator: Secondary | ICD-10-CM | POA: Diagnosis not present

## 2020-06-07 DIAGNOSIS — I5022 Chronic systolic (congestive) heart failure: Secondary | ICD-10-CM

## 2020-06-07 DIAGNOSIS — Z951 Presence of aortocoronary bypass graft: Secondary | ICD-10-CM | POA: Diagnosis not present

## 2020-06-07 DIAGNOSIS — I2510 Atherosclerotic heart disease of native coronary artery without angina pectoris: Secondary | ICD-10-CM

## 2020-06-07 DIAGNOSIS — Z7951 Long term (current) use of inhaled steroids: Secondary | ICD-10-CM | POA: Diagnosis not present

## 2020-06-07 DIAGNOSIS — Z7982 Long term (current) use of aspirin: Secondary | ICD-10-CM | POA: Diagnosis not present

## 2020-06-07 DIAGNOSIS — S81802A Unspecified open wound, left lower leg, initial encounter: Secondary | ICD-10-CM | POA: Insufficient documentation

## 2020-06-07 DIAGNOSIS — N183 Chronic kidney disease, stage 3 unspecified: Secondary | ICD-10-CM | POA: Insufficient documentation

## 2020-06-07 DIAGNOSIS — S8992XA Unspecified injury of left lower leg, initial encounter: Secondary | ICD-10-CM | POA: Diagnosis present

## 2020-06-07 DIAGNOSIS — I130 Hypertensive heart and chronic kidney disease with heart failure and stage 1 through stage 4 chronic kidney disease, or unspecified chronic kidney disease: Secondary | ICD-10-CM | POA: Insufficient documentation

## 2020-06-07 DIAGNOSIS — Z79899 Other long term (current) drug therapy: Secondary | ICD-10-CM | POA: Insufficient documentation

## 2020-06-07 DIAGNOSIS — I255 Ischemic cardiomyopathy: Secondary | ICD-10-CM

## 2020-06-07 DIAGNOSIS — Z7901 Long term (current) use of anticoagulants: Secondary | ICD-10-CM | POA: Diagnosis not present

## 2020-06-07 DIAGNOSIS — I480 Paroxysmal atrial fibrillation: Secondary | ICD-10-CM

## 2020-06-07 LAB — CUP PACEART INCLINIC DEVICE CHECK
Date Time Interrogation Session: 20220311154949
Implantable Lead Implant Date: 20110610
Implantable Lead Implant Date: 20110610
Implantable Lead Location: 753859
Implantable Lead Location: 753860
Implantable Lead Model: 185
Implantable Lead Model: 4135
Implantable Lead Serial Number: 28681386
Implantable Lead Serial Number: 339643
Implantable Pulse Generator Implant Date: 20110610
Lead Channel Pacing Threshold Amplitude: 0.9 V
Lead Channel Pacing Threshold Amplitude: 1.1 V
Lead Channel Pacing Threshold Pulse Width: 0.4 ms
Lead Channel Pacing Threshold Pulse Width: 0.4 ms
Lead Channel Sensing Intrinsic Amplitude: 13.1 mV
Lead Channel Sensing Intrinsic Amplitude: 3.9 mV
Pulse Gen Serial Number: 164892

## 2020-06-07 NOTE — ED Provider Notes (Signed)
[facility] EMERGENCY DEPARTMENT Provider Note  CSN: [id number] Arrival date & time: 06/07/20 1619    History Chief Complaint  Patient presents with  . Leg Problem    HPI  Steven Guzman is a 80 y.o. male with history of multiple medical problems reports several weeks ago he bumped his L leg on something and sustained a wound. He dressed it with a bandaid and a few days later noticed and odor. He took off the bandaid and cleaned it with peroxide and since then it has not had any odor or drainage. He noticed some swelling just below the wound but unsure how long it has been there. He was at the cardiology office today for a regular check up and they noticed the wound as well. They did not document any signs of infection and recommended he see his PCP for the wound. Patient came here from their office for evaluation. Denies any fever, drainage, redness or pain.    Past Medical History:  Diagnosis Date  . AICD (automatic cardioverter/defibrillator) present   . Aneurysm ([place])    a. Aneurysmal infrarenal aorta up to 33 mm on CT 10/2014, recommended f/u due 10/2017  . Anginal pain ([place])   . Anxiety   . Basal cell carcinoma of nose    S/P MOHS  . Biliary acute pancreatitis   . CAD (coronary artery disease)    a. s/p MI in 1994 with PCI to LAD at that time b. cath 10/2012 demonstrated EF 30%, inferior akinesis with mild hypokinesis of all walls, patent LAD and RCA stents; ostial PDA with 80-90% obstruction with medical therapy recommended   . Chronic systolic CHF (congestive heart failure) (HCC)    EF 30 to 35 % as of 09/2014.   [name] CKD (chronic kidney disease), stage III ([place])   . Complication of anesthesia 10/2014   "had to have defibrillator w/ERCP"  . COPD (chronic obstructive pulmonary disease) ([place])    a. followed by pulmonary, COPD GOLD stage II  . Depression   . Diverticulosis of colon 07/2014   noted on CT  . GERD (gastroesophageal reflux disease)   . Hiatal hernia    . Hyperglycemia 10/2012.  [name] Hyperlipidemia   . Hypertension   . Myocardial infarction [facility]) 1994; 2011  . Pneumonia 1946; 2015  . Prostate enlargement 07/2014   observed on CT  . Tobacco abuse   . Ventricular tachycardia ([place])    a. 08/2009 s/p BSX E110 Teligen 100 AICD, ser#: [id number];  b. 08/2008 VT req ATP - detection reprogrammed from 160 to 150. c. EPS and VT ablation by Dr. Lovena Le 12/21/2014    Past Surgical History:  Procedure Laterality Date  . BIOPSY  12/21/2017   Procedure: BIOPSY;  Surgeon: [name], MD;  Location: [facility];  Service: Endoscopy;;  . CATARACT EXTRACTION W/ INTRAOCULAR LENS  IMPLANT, BILATERAL Bilateral ~ 2011  . COLONOSCOPY    . COLONOSCOPY WITH PROPOFOL N/A 12/21/2017   Procedure: COLONOSCOPY WITH PROPOFOL;  Surgeon: [name], MD;  Location: [facility] ENDOSCOPY;  Service: Endoscopy;  Laterality: N/A;  . ELECTROPHYSIOLOGIC STUDY N/A 12/21/2014   Procedure: V Tach Ablation;  Surgeon: [name], MD;  Location: [facility] CV LAB;  Service: Cardiovascular;  Laterality: N/A;  . ERCP N/A 11/16/2014   Procedure: ENDOSCOPIC RETROGRADE CHOLANGIOPANCREATOGRAPHY (ERCP);  Surgeon: [name], MD;  Location: [facility];  Service: Endoscopy;  Laterality: N/A;  . ESOPHAGOGASTRODUODENOSCOPY (EGD) WITH PROPOFOL N/A 12/21/2017  Procedure: ESOPHAGOGASTRODUODENOSCOPY (EGD) WITH PROPOFOL;  Surgeon: [name], MD;  Location: [facility];  Service: Endoscopy;  Laterality: N/A;  . EYE SURGERY    . FOOT SURGERY Left 2005   "fixed bone that stuck out in my ankle area"  . HEMORRHOID BANDING    . IMPLANTABLE CARDIOVERTER DEFIBRILLATOR IMPLANT  09/06/09   BSX dual chamber ICD implanted in [place] for cardiac arrest 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  . INGUINAL HERNIA REPAIR Right ~ 1995  . LEFT HEART CATHETERIZATION WITH CORONARY ANGIOGRAM N/A 11/25/2012   demonstrated EF 30%, inferior akinesis with mild hypokinesis of all walls, patent LAD and RCA stents; ostial PDA with 80-90%  obstruction with medical therapy recommended  . MOHS SURGERY  2008   nose, skin graft  . POLYPECTOMY  12/21/2017   Procedure: POLYPECTOMY;  Surgeon: [name], MD;  Location: [facility];  Service: Endoscopy;;  . RETINAL DETACHMENT SURGERY Right 2013  . TENOLYSIS Right 12/21/2013   Procedure: TENOLYSIS FLEXOR CARPI RADIALIS ,DEBRIDEMENT RIGHT JOINT WRIST,DEBRIDEMENT SCAPHOTRAPEZIAL TRAPEZOID, REPAIR OF EXTENSOR HOOD;  Surgeon: Daryll Brod, MD;  Location: [facility];  Service: Orthopedics;  Laterality: Right;  . TOE SURGERY Right 09/2019   3rd toe/hammer toe  . V-TACH ABLATION  12/21/2014  . VIDEO BRONCHOSCOPY Bilateral 01/09/2016   Procedure: VIDEO BRONCHOSCOPY WITHOUT FLUORO;  Surgeon: Juanito Doom, MD;  Location: [facility] END[facility];  Service: Cardiopulmonary;  Laterality: Bilateral;    Family History  Problem Relation Age of Onset  . Heart attack Brother   . CAD Father   . Hypertension Father   . CAD Mother   . Hypertension Mother   . Hypertension Brother   . Stroke Neg Hx     Social History   Tobacco Use  . Smoking status: Former Smoker    Packs/day: 1.00    Years: 55.00    Pack years: 55.00    Types: Cigarettes  . Smokeless tobacco: Never Used  . Tobacco comment: off/on, always ready to quit but does not work out  Media planner  . Vaping Use: Never used  Substance Use Topics  . Alcohol use: Yes    Alcohol/week: 0.0 standard drinks    Comment: occ  . Drug use: No     Home Medications Prior to Admission medications   Medication Sig Start Date End Date Taking? Authorizing Provider  acetaminophen (TYLENOL) 500 MG tablet Take 2 tablets by mouth daily. 12/16/17  Yes [provider]  albuterol (PROVENTIL) (2.5 MG/3ML) 0.083% nebulizer solution USE 1 VIAL VIA NEBULIZER EVERY 6 HOURS AS NEEDED FOR WHEEZING OR SHORTNESS OF BREATH 07/18/19  Yes Olalere, Adewale A, MD  amiodarone (PACERONE) 200 MG tablet TAKE 1 TABLET BY MOUTH EVERY DAY 12/06/19  Yes Belva Crome, MD  apixaban (ELIQUIS) 5 MG TABS tablet Take 1 tablet (5 mg total) by mouth 2 (two) times daily. 06/29/19  Yes [name], MD  Artificial Tear Solution (SOOTHE XP OP) Place 2 drops into both eyes daily as needed (dry eyes).   Yes [provider]  aspirin EC 81 MG tablet Take 1 tablet (81 mg total) by mouth every Monday, Wednesday, and Friday. 04/25/18  Yes Belva Crome, MD  atorvastatin (LIPITOR) 80 MG tablet TAKE 1 TABLET(80 MG) BY MOUTH DAILY 08/09/19  Yes Belva Crome, MD  benazepril (LOTENSIN) 10 MG tablet Take 5 mg by mouth daily.   Yes [provider]  budesonide-formoterol (SYMBICORT) 160-4.5 MCG/ACT inhaler Inhale 2 puffs into  the lungs 2 (two) times daily. 05/10/20  Yes Fargo, Amy E, NP  buPROPion (WELLBUTRIN) 75 MG tablet TAKE 1 TABLET BY MOUTH EVERY DAY 10/23/19  Yes [name], NP  busPIRone (BUSPAR) 15 MG tablet Take 1 tablet (15 mg total) by mouth 2 (two) times daily. 04/19/20  Yes [name], NP  carvedilol (COREG) 3.125 MG tablet TAKE 1 TABLET BY MOUTH TWICE A DAY 12/19/19  Yes [name], NP  cetirizine (ZYRTEC) 10 MG tablet Take 1 tablet by mouth daily as needed. 12/16/17  Yes [provider]  Choline Fenofibrate (FENOFIBRIC ACID) 135 MG CPDR Take 1 tablet by mouth daily. Please keep upcoming appt in April 2022 with Dr. Tamala Julian before anymore refills. Thank you 06/06/20  Yes Belva Crome, MD  escitalopram (LEXAPRO) 20 MG tablet TAKE 1 TABLET BY MOUTH EVERY DAY 02/05/20  Yes [name], NP  fluticasone (FLONASE) 50 MCG/ACT nasal spray Place 2 sprays into both nostrils as needed.  04/14/18  Yes [provider]  furosemide (LASIX) 40 MG tablet TAKE 1 TABLET(20 MG) BY MOUTH DAILY for 3 days, then resume 1/2 tablet daily 03/12/20  Yes Fargo, Amy E, NP  guaiFENesin (MUCINEX) 600 MG 12 hr tablet Take 600 mg by mouth 2 (two) times daily.   Yes [provider]  HYDROcodone-acetaminophen (NORCO/VICODIN) 5-325  MG tablet Take 1 tablet by mouth every 4 (four) hours as needed for moderate pain. 03/18/20 03/18/21 Yes Caryl Ada K, PA-C  Ipratropium-Albuterol (COMBIVENT RESPIMAT) 20-100 MCG/ACT AERS respimat Inhale 1 puff into the lungs every 6 (six) hours. Shortness of breath or wheezing 05/10/20  Yes Fargo, Amy E, NP  IRON PO Take by mouth daily.    Yes [provider]  MAGNESIUM-OXIDE 400 (241.3 Mg) MG tablet TAKE 1 TABSULE BY MOUTH DAILY 11/24/18  Yes Hoyt Koch, MD  mexiletine (MEXITIL) 200 MG capsule Take 1 capsule (200 mg total) by mouth 2 (two) times daily. 05/21/20  Yes [name], MD  Multiple Vitamins-Minerals (CENTRUM ADULTS PO) Take by mouth daily.   Yes [provider]  nitroGLYCERIN (NITROSTAT) 0.4 MG SL tablet DISSOLVE 1 TABLET UNDER THE TONGUE EVERY 5 MINUTES FOR 3 DOSES AS NEEDED FOR CHEST PAIN 09/26/18  Yes [name], MD  omeprazole (PRILOSEC) 20 MG capsule Take 20 mg by mouth daily.    Yes [provider]  potassium chloride SA (KLOR-CON M20) 20 MEQ tablet TAKE 1 TABLET BY MOUTH EVERY DAY 06/06/20  Yes [name], NP  Respiratory Therapy Supplies (FLUTTER) DEVI Use as directed 03/16/17  Yes Juanito Doom, MD  Sennosides-Docusate Sodium (STOOL SOFTENER/LAXATIVE PO) Take by mouth.   Yes [provider]  simethicone (GAS-X) 80 MG chewable tablet Chew 1 tablet (80 mg total) by mouth every 6 (six) hours as needed for flatulence. 03/12/20  Yes Yvonna Alanis, NP  Spacer/Aero-Holding [facility] DIAMOND) [place] optichamber [facility] 09/12/19  Yes Sherrilyn Rist A, MD  tamsulosin (FLOMAX) 0.4 MG CAPS capsule TAKE 1 CAPSULE(0.4 MG) BY MOUTH DAILY AFTER SUPPER 03/08/20  Yes [name], NP  Tiotropium Bromide Monohydrate (SPIRIVA RESPIMAT) 2.5 MCG/ACT AERS Inhale 2 puffs into the lungs daily. 04/09/20  Yes Fargo, Amy E, NP  vitamin B-12 (CYANOCOBALAMIN) 1000 MCG tablet Take 1 tablet (1,000 mcg total) by mouth daily. 02/27/19   Yes [name], NP  Sennosides-Docusate Sodium (STOOL SOFTENER/LAXATIVE PO) Take 2 tablets by mouth daily.    [provider]  Allergies    Sulfa antibiotics   Review of Systems   Review of Systems A comprehensive review of systems was completed and negative except as noted in HPI.    Physical Exam BP 97/66 (BP Location: Right Arm)   Pulse 73   Temp 97.7 F (36.5 C)   Resp (!) 24   Ht 6' (1.829 m)   Wt 97 kg   SpO2 93%   BMI 29.00 kg/m   Physical Exam Vitals and nursing note reviewed.  HENT:     Head: Normocephalic.     Nose: Nose normal.  Eyes:     Extraocular Movements: Extraocular movements intact.  Pulmonary:     Effort: Pulmonary effort is normal.  Musculoskeletal:        General: Normal range of motion.     Cervical back: Neck supple.  Skin:    Findings: No rash (on exposed skin).     Comments: Dry scab over wound on L lateral lower leg, there is some subtle soft tissue swelling distal to that area without signs of infection or focal fluid collection  Neurological:     Mental Status: He is alert and oriented to person, place, and time.  Psychiatric:        Mood and Affect: Mood normal.      ED Results / Procedures / Treatments   Labs (all labs ordered are listed, but only abnormal results are displayed) Labs Reviewed - No data to display  EKG None   Radiology [facility] [place]  Result Date: 06/07/2020 ICD check in clinic. Normal device function. Thresholds and sensing consistent with previous device measurements. Impedance trends stable over time. No mode switches. available EGMs for review, NSVT are 1:1, not all episodes have EGMs, No sustained or treated ventricular arrhythmias. Histogram distribution appropriate for patient and level of activity. No changes made this session. Device programmed at appropriate safety margins. Device programmed to optimize intrinsic conduction. Estimated longevity _1.5 years__. Pt  enrolled in remote follow-up. Patient education completed including shock plan. Auditory/vibratory alert demonstrated.   Procedures Procedures  Medications Ordered in the ED Medications - No data to display   MDM Rules/Calculators/A&P MDM Patient with chronic appearing LE wound, no signs of infection or fluid collection. Recommend continued home wound care and PCP follow up. No indication for emergent intervention today.   ED Course  I have reviewed the triage vital signs and the nursing notes.  Pertinent labs & imaging results that were available during my care of the patient were reviewed by me and considered in my medical decision making (see chart for details).     Final Clinical Impression(s) / ED Diagnoses Final diagnoses:  Leg wound, left, initial encounter    Rx / DC Orders ED Discharge Orders    None       Truddie Hidden, MD 06/07/20 1705

## 2020-06-07 NOTE — ED Triage Notes (Addendum)
Pt c/o left LE swelling x 1 week-open wound ~2 weeks ago with foul smell-states he was seen by cards today and was advised to come to ED-NAD-steady gait

## 2020-06-07 NOTE — ED Notes (Signed)
ED Provider at bedside. 

## 2020-06-07 NOTE — Patient Instructions (Signed)
Medication Instructions:   Your physician recommends that you continue on your current medications as directed. Please refer to the Current Medication list given to you today.   *If you need a refill on your cardiac medications before your next appointment, please call your pharmacy*   Lab Work:  CMET  CBC AND TSH TODAY    If you have labs (blood work) drawn today and your tests are completely normal, you will receive your results only by: [name] MyChart Message (if you have MyChart) OR . A paper copy in the mail If you have any lab test that is abnormal or we need to change your treatment, we will call you to review the results.   Testing/Procedures: NONE ORDERED  TODAY    Follow-Up: At [facility], you and your health needs are our priority.  As part of our continuing mission to provide you with exceptional heart care, we have created designated Provider Care Teams.  These Care Teams include your primary Cardiologist (physician) and Advanced Practice Providers (APPs -  Physician Assistants and Nurse Practitioners) who all work together to provide you with the care you need, when you need it.  We recommend signing up for the patient portal called "MyChart".  Sign up information is provided on this After Visit Summary.  MyChart is used to connect with patients for Virtual Visits (Telemedicine).  Patients are able to view lab/test results, encounter notes, upcoming appointments, etc.  Non-urgent messages can be sent to your provider as well.   To learn more about what you can do with MyChart, go to [email or web address].    Your next appointment:   1 year(s)  The format for your next appointment:   In Person  Provider:   You may see Cristopher Peru, MD or one of the following Advanced Practice Providers on your designated Care Team:    Chanetta Marshall, NP  Tommye Standard, PA-C  Legrand Como "Oda Kilts, [place]    Other Instructions

## 2020-06-08 LAB — COMPREHENSIVE METABOLIC PANEL
ALT: 67 IU/L — ABNORMAL HIGH (ref 0–44)
AST: 61 IU/L — ABNORMAL HIGH (ref 0–40)
Albumin/Globulin Ratio: 1.9 (ref 1.2–2.2)
Albumin: 4.3 g/dL (ref 3.6–4.6)
Alkaline Phosphatase: 54 IU/L (ref 44–121)
BUN/Creatinine Ratio: 10 (ref 10–24)
BUN: 11 mg/dL (ref 8–27)
Bilirubin Total: 0.8 mg/dL (ref 0.0–1.2)
CO2: 23 mmol/L (ref 20–29)
Calcium: 9.5 mg/dL (ref 8.6–10.2)
Chloride: 102 mmol/L (ref 96–106)
Creatinine, Ser: 1.08 mg/dL (ref 0.76–1.27)
Globulin, Total: 2.3 g/dL (ref 1.5–4.5)
Glucose: 90 mg/dL (ref 65–99)
Potassium: 4.4 mmol/L (ref 3.5–5.2)
Sodium: 142 mmol/L (ref 134–144)
Total Protein: 6.6 g/dL (ref 6.0–8.5)
eGFR: 69 mL/min/{1.73_m2} (ref >59)

## 2020-06-08 LAB — CBC
Hematocrit: 38 % (ref 37.5–51.0)
Hemoglobin: 13 g/dL (ref 13.0–17.7)
MCH: 33.4 pg — ABNORMAL HIGH (ref 26.6–33.0)
MCHC: 34.2 g/dL (ref 31.5–35.7)
MCV: 98 fL — ABNORMAL HIGH (ref 79–97)
Platelets: 177 10*3/uL (ref 150–450)
RBC: 3.89 x10E6/uL — ABNORMAL LOW (ref 4.14–5.80)
RDW: 13.4 % (ref 11.6–15.4)
WBC: 5.1 10*3/uL (ref 3.4–10.8)

## 2020-06-08 LAB — TSH: TSH: 2.29 u[IU]/mL (ref 0.450–4.500)

## 2020-06-10 ENCOUNTER — Encounter: Payer: Self-pay | Admitting: Orthopedic Surgery

## 2020-07-02 ENCOUNTER — Other Ambulatory Visit: Payer: Self-pay | Admitting: Interventional Cardiology

## 2020-07-02 ENCOUNTER — Other Ambulatory Visit: Payer: Self-pay | Admitting: Nurse Practitioner

## 2020-07-02 DIAGNOSIS — I25810 Atherosclerosis of coronary artery bypass graft(s) without angina pectoris: Secondary | ICD-10-CM

## 2020-07-02 DIAGNOSIS — I5022 Chronic systolic (congestive) heart failure: Secondary | ICD-10-CM

## 2020-07-07 ENCOUNTER — Emergency Department (HOSPITAL_BASED_OUTPATIENT_CLINIC_OR_DEPARTMENT_OTHER): Payer: Medicare Other

## 2020-07-07 ENCOUNTER — Emergency Department (HOSPITAL_BASED_OUTPATIENT_CLINIC_OR_DEPARTMENT_OTHER)
Admit: 2020-07-07 | Discharge: 2020-07-07 | Disposition: A | Discharge status: Home / Self Care | Payer: Medicare Other | Attending: Emergency Medicine | Admitting: Emergency Medicine

## 2020-07-07 ENCOUNTER — Other Ambulatory Visit: Payer: Self-pay

## 2020-07-07 ENCOUNTER — Emergency Department (HOSPITAL_BASED_OUTPATIENT_CLINIC_OR_DEPARTMENT_OTHER)
Admission: EM | Admit: 2020-07-07 | Discharge: 2020-07-07 | Disposition: A | Discharge status: Home / Self Care | Payer: Medicare Other | Attending: Emergency Medicine | Admitting: Emergency Medicine

## 2020-07-07 ENCOUNTER — Encounter (HOSPITAL_BASED_OUTPATIENT_CLINIC_OR_DEPARTMENT_OTHER): Payer: Self-pay | Admitting: Emergency Medicine

## 2020-07-07 DIAGNOSIS — R42 Dizziness and giddiness: Secondary | ICD-10-CM | POA: Diagnosis not present

## 2020-07-07 DIAGNOSIS — Z85828 Personal history of other malignant neoplasm of skin: Secondary | ICD-10-CM | POA: Insufficient documentation

## 2020-07-07 DIAGNOSIS — N183 Chronic kidney disease, stage 3 unspecified: Secondary | ICD-10-CM | POA: Insufficient documentation

## 2020-07-07 DIAGNOSIS — Z87891 Personal history of nicotine dependence: Secondary | ICD-10-CM | POA: Insufficient documentation

## 2020-07-07 DIAGNOSIS — J449 Chronic obstructive pulmonary disease, unspecified: Secondary | ICD-10-CM | POA: Insufficient documentation

## 2020-07-07 DIAGNOSIS — Z79899 Other long term (current) drug therapy: Secondary | ICD-10-CM | POA: Diagnosis not present

## 2020-07-07 DIAGNOSIS — X58XXXA Exposure to other specified factors, initial encounter: Secondary | ICD-10-CM | POA: Diagnosis not present

## 2020-07-07 DIAGNOSIS — S79922A Unspecified injury of left thigh, initial encounter: Secondary | ICD-10-CM | POA: Diagnosis present

## 2020-07-07 DIAGNOSIS — D649 Anemia, unspecified: Secondary | ICD-10-CM | POA: Insufficient documentation

## 2020-07-07 DIAGNOSIS — I130 Hypertensive heart and chronic kidney disease with heart failure and stage 1 through stage 4 chronic kidney disease, or unspecified chronic kidney disease: Secondary | ICD-10-CM | POA: Diagnosis not present

## 2020-07-07 DIAGNOSIS — I5022 Chronic systolic (congestive) heart failure: Secondary | ICD-10-CM | POA: Diagnosis not present

## 2020-07-07 DIAGNOSIS — Z95810 Presence of automatic (implantable) cardiac defibrillator: Secondary | ICD-10-CM | POA: Diagnosis not present

## 2020-07-07 DIAGNOSIS — I2510 Atherosclerotic heart disease of native coronary artery without angina pectoris: Secondary | ICD-10-CM | POA: Insufficient documentation

## 2020-07-07 DIAGNOSIS — S70312A Abrasion, left thigh, initial encounter: Secondary | ICD-10-CM | POA: Insufficient documentation

## 2020-07-07 DIAGNOSIS — R600 Localized edema: Secondary | ICD-10-CM | POA: Diagnosis not present

## 2020-07-07 DIAGNOSIS — S80811A Abrasion, right lower leg, initial encounter: Secondary | ICD-10-CM | POA: Diagnosis not present

## 2020-07-07 DIAGNOSIS — Z7982 Long term (current) use of aspirin: Secondary | ICD-10-CM | POA: Insufficient documentation

## 2020-07-07 LAB — CBC WITH DIFFERENTIAL/PLATELET
Abs Immature Granulocytes: 0.08 10*3/uL — ABNORMAL HIGH (ref 0.00–0.07)
Basophils Absolute: 0.1 10*3/uL (ref 0.0–0.1)
Basophils Relative: 1 %
Eosinophils Absolute: 0.1 10*3/uL (ref 0.0–0.5)
Eosinophils Relative: 3 %
HCT: 36.2 % — ABNORMAL LOW (ref 39.0–52.0)
Hemoglobin: 11.9 g/dL — ABNORMAL LOW (ref 13.0–17.0)
Immature Granulocytes: 2 %
Lymphocytes Relative: 26 %
Lymphs Abs: 1.1 10*3/uL (ref 0.7–4.0)
MCH: 33.7 pg (ref 26.0–34.0)
MCHC: 32.9 g/dL (ref 30.0–36.0)
MCV: 102.5 fL — ABNORMAL HIGH (ref 80.0–100.0)
Monocytes Absolute: 0.5 10*3/uL (ref 0.1–1.0)
Monocytes Relative: 12 %
Neutro Abs: 2.4 10*3/uL (ref 1.7–7.7)
Neutrophils Relative %: 56 %
Platelets: 167 10*3/uL (ref 150–400)
RBC: 3.53 MIL/uL — ABNORMAL LOW (ref 4.22–5.81)
RDW: 14.9 % (ref 11.5–15.5)
WBC: 4.2 10*3/uL (ref 4.0–10.5)
nRBC: 0 % (ref 0.0–0.2)

## 2020-07-07 LAB — BASIC METABOLIC PANEL
Anion gap: 8 (ref 5–15)
BUN: 21 mg/dL (ref 8–23)
CO2: 26 mmol/L (ref 22–32)
Calcium: 9.2 mg/dL (ref 8.9–10.3)
Chloride: 102 mmol/L (ref 98–111)
Creatinine, Ser: 1.11 mg/dL (ref 0.61–1.24)
GFR, Estimated: >60 mL/min (ref >60)
Glucose, Bld: 102 mg/dL — ABNORMAL HIGH (ref 70–99)
Potassium: 3.7 mmol/L (ref 3.5–5.1)
Sodium: 136 mmol/L (ref 135–145)

## 2020-07-07 NOTE — ED Notes (Signed)
EDP at bedside  

## 2020-07-07 NOTE — ED Triage Notes (Signed)
Reports R leg swelling x "a couple of days". Pt states he has been very active lately as he is packing to move. Pts limbs are virtually covered in bruises, he states he is on blood thinners. Denies new SHOB. Hx COPD, heart disease.

## 2020-07-07 NOTE — ED Provider Notes (Signed)
[facility] EMERGENCY DEPARTMENT Provider Note  CSN: [id number] Arrival date & time: 07/07/20 4656    History Chief Complaint  Patient presents with  . Leg Swelling    HPI  Steven Guzman is a 80 y.o. male with multiple medical problems including afib on Eliquis presents for evaluation of R leg swelling for the last 2 days. Not improved with elevation. Not associated with CP or SOB. He has had numerous superficial skin wounds on his arms and legs because he and his wife are packing to move. He has very thin skin and bruises easily. He denies any fevers. No significant pain in his leg.   Additionally he reports he has been feeling off balance the last 3-4 days when he is walking. He has had a mild frontal headache with that. He denies any room spinning but states this feels like when he had vertigo years ago. He has not fallen and has not had a head injury   Past Medical History:  Diagnosis Date  . AICD (automatic cardioverter/defibrillator) present   . Aneurysm ([place])    a. Aneurysmal infrarenal aorta up to 33 mm on CT 10/2014, recommended f/u due 10/2017  . Anginal pain ([place])   . Anxiety   . Basal cell carcinoma of nose    S/P MOHS  . Biliary acute pancreatitis   . CAD (coronary artery disease)    a. s/p MI in 1994 with PCI to LAD at that time b. cath 10/2012 demonstrated EF 30%, inferior akinesis with mild hypokinesis of all walls, patent LAD and RCA stents; ostial PDA with 80-90% obstruction with medical therapy recommended   . Chronic systolic CHF (congestive heart failure) (HCC)    EF 30 to 35 % as of 09/2014.   [name] CKD (chronic kidney disease), stage III ([place])   . Complication of anesthesia 10/2014   "had to have defibrillator w/ERCP"  . COPD (chronic obstructive pulmonary disease) ([place])    a. followed by pulmonary, COPD GOLD stage II  . Depression   . Diverticulosis of colon 07/2014   noted on CT  . GERD (gastroesophageal reflux disease)   . Hiatal hernia   .  Hyperglycemia 10/2012.  [name] Hyperlipidemia   . Hypertension   . Myocardial infarction [facility]) 1994; 2011  . Pneumonia 1946; 2015  . Prostate enlargement 07/2014   observed on CT  . Tobacco abuse   . Ventricular tachycardia ([place])    a. 08/2009 s/p BSX E110 Teligen 100 AICD, ser#: [id number];  b. 08/2008 VT req ATP - detection reprogrammed from 160 to 150. c. EPS and VT ablation by Dr. Lovena Le 12/21/2014    Past Surgical History:  Procedure Laterality Date  . BIOPSY  12/21/2017   Procedure: BIOPSY;  Surgeon: [name], MD;  Location: [facility];  Service: Endoscopy;;  . CATARACT EXTRACTION W/ INTRAOCULAR LENS  IMPLANT, BILATERAL Bilateral ~ 2011  . COLONOSCOPY    . COLONOSCOPY WITH PROPOFOL N/A 12/21/2017   Procedure: COLONOSCOPY WITH PROPOFOL;  Surgeon: [name], MD;  Location: [facility] END[facility];  Service: Endoscopy;  Laterality: N/A;  . ELECTROPHYSIOLOGIC STUDY N/A 12/21/2014   Procedure: V Tach Ablation;  Surgeon: [name], MD;  Location: [facility] CV LAB;  Service: Cardiovascular;  Laterality: N/A;  . ERCP N/A 11/16/2014   Procedure: ENDOSCOPIC RETROGRADE CHOLANGIOPANCREATOGRAPHY (ERCP);  Surgeon: [name], MD;  Location: [place];  Service: Endoscopy;  Laterality: N/A;  . ESOPHAGOGASTRODUODENOSCOPY (EGD) WITH PROPOFOL N/A 12/21/2017  Procedure: ESOPHAGOGASTRODUODENOSCOPY (EGD) WITH PROPOFOL;  Surgeon: [name], MD;  Location: [facility];  Service: Endoscopy;  Laterality: N/A;  . EYE SURGERY    . FOOT SURGERY Left 2005   "fixed bone that stuck out in my ankle area"  . HEMORRHOID BANDING    . IMPLANTABLE CARDIOVERTER DEFIBRILLATOR IMPLANT  09/06/09   BSX dual chamber ICD implanted in [place] for cardiac arrest 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  . INGUINAL HERNIA REPAIR Right ~ 1995  . LEFT HEART CATHETERIZATION WITH CORONARY ANGIOGRAM N/A 11/25/2012   demonstrated EF 30%, inferior akinesis with mild hypokinesis of all walls, patent LAD and RCA stents; ostial PDA with 80-90%  obstruction with medical therapy recommended  . MOHS SURGERY  2008   nose, skin graft  . POLYPECTOMY  12/21/2017   Procedure: POLYPECTOMY;  Surgeon: [name], MD;  Location: [facility];  Service: Endoscopy;;  . RETINAL DETACHMENT SURGERY Right 2013  . TENOLYSIS Right 12/21/2013   Procedure: TENOLYSIS FLEXOR CARPI RADIALIS ,DEBRIDEMENT RIGHT JOINT WRIST,DEBRIDEMENT SCAPHOTRAPEZIAL TRAPEZOID, REPAIR OF EXTENSOR HOOD;  Surgeon: Daryll Brod, MD;  Location: [place];  Service: Orthopedics;  Laterality: Right;  . TOE SURGERY Right 09/2019   3rd toe/hammer toe  . V-TACH ABLATION  12/21/2014  . VIDEO BRONCHOSCOPY Bilateral 01/09/2016   Procedure: VIDEO BRONCHOSCOPY WITHOUT FLUORO;  Surgeon: Juanito Doom, MD;  Location: [facility];  Service: Cardiopulmonary;  Laterality: Bilateral;    Family History  Problem Relation Age of Onset  . Heart attack Brother   . CAD Father   . Hypertension Father   . CAD Mother   . Hypertension Mother   . Hypertension Brother   . Stroke Neg Hx     Social History   Tobacco Use  . Smoking status: Former Smoker    Packs/day: 1.00    Years: 55.00    Pack years: 55.00    Types: Cigarettes  . Smokeless tobacco: Never Used  . Tobacco comment: off/on, always ready to quit but does not work out  Media planner  . Vaping Use: Never used  Substance Use Topics  . Alcohol use: Yes    Alcohol/week: 0.0 standard drinks    Comment: occ  . Drug use: No     Home Medications Prior to Admission medications   Medication Sig Start Date End Date Taking? Authorizing Provider  acetaminophen (TYLENOL) 500 MG tablet Take 2 tablets by mouth daily. 12/16/17   [provider]  albuterol (PROVENTIL) (2.5 MG/3ML) 0.083% nebulizer solution USE 1 VIAL VIA NEBULIZER EVERY 6 HOURS AS NEEDED FOR WHEEZING OR SHORTNESS OF BREATH 07/18/19   Sherrilyn Rist A, MD  amiodarone (PACERONE) 200 MG tablet TAKE 1 TABLET BY MOUTH EVERY DAY 12/06/19   Belva Crome,  MD  apixaban (ELIQUIS) 5 MG TABS tablet Take 1 tablet (5 mg total) by mouth 2 (two) times daily. 06/29/19   [name], MD  Artificial Tear Solution (SOOTHE XP OP) Place 2 drops into both eyes daily as needed (dry eyes).    [provider]  aspirin EC 81 MG tablet Take 1 tablet (81 mg total) by mouth every Monday, Wednesday, and Friday. 04/25/18   Belva Crome, MD  atorvastatin (LIPITOR) 80 MG tablet TAKE 1 TABLET(80 MG) BY MOUTH DAILY 08/09/19   Belva Crome, MD  benazepril (LOTENSIN) 10 MG tablet TAKE 1 TABLET BY MOUTH EVERY DAY 07/03/20   Belva Crome, MD  budesonide-formoterol [facility]) 160-4.5 MCG/ACT inhaler Inhale 2  puffs into the lungs 2 (two) times daily. 05/10/20   Yvonna Alanis, NP  buPROPion (WELLBUTRIN) 75 MG tablet TAKE 1 TABLET BY MOUTH EVERY DAY 10/23/19   [name], NP  busPIRone (BUSPAR) 15 MG tablet Take 1 tablet (15 mg total) by mouth 2 (two) times daily. 04/19/20   [name], NP  carvedilol (COREG) 3.125 MG tablet TAKE 1 TABLET BY MOUTH TWICE A DAY 07/02/20   [name], NP  cetirizine (ZYRTEC) 10 MG tablet Take 1 tablet by mouth daily as needed. 12/16/17   [provider]  Choline Fenofibrate (FENOFIBRIC ACID) 135 MG CPDR Take 1 tablet by mouth daily. Please keep upcoming appt in April 2022 with Dr. Tamala Julian before anymore refills. Thank you 06/06/20   Belva Crome, MD  escitalopram (LEXAPRO) 20 MG tablet TAKE 1 TABLET BY MOUTH EVERY DAY 02/05/20   [name], NP  fluticasone (FLONASE) 50 MCG/ACT nasal spray Place 2 sprays into both nostrils as needed.  04/14/18   [provider]  furosemide (LASIX) 40 MG tablet TAKE 1 TABLET(20 MG) BY MOUTH DAILY for 3 days, then resume 1/2 tablet daily 03/12/20   Fargo, Amy E, NP  guaiFENesin (MUCINEX) 600 MG 12 hr tablet Take 600 mg by mouth 2 (two) times daily.    [provider]  HYDROcodone-acetaminophen (NORCO/VICODIN) 5-325 MG tablet Take 1 tablet by mouth every 4 (four)  hours as needed for moderate pain. 03/18/20 03/18/21  Fransico Meadow, PA-C  Ipratropium-Albuterol (COMBIVENT RESPIMAT) 20-100 MCG/ACT AERS respimat Inhale 1 puff into the lungs every 6 (six) hours. Shortness of breath or wheezing 05/10/20   Fargo, Amy E, NP  IRON PO Take by mouth daily.     [provider]  MAGNESIUM-OXIDE 400 (241.3 Mg) MG tablet TAKE 1 TABSULE BY MOUTH DAILY 11/24/18   Hoyt Koch, MD  mexiletine (MEXITIL) 200 MG capsule Take 1 capsule (200 mg total) by mouth 2 (two) times daily. 05/21/20   [name], MD  Multiple Vitamins-Minerals (CENTRUM ADULTS PO) Take by mouth daily.    [provider]  nitroGLYCERIN (NITROSTAT) 0.4 MG SL tablet DISSOLVE 1 TABLET UNDER THE TONGUE EVERY 5 MINUTES FOR 3 DOSES AS NEEDED FOR CHEST PAIN 09/26/18   [name], MD  omeprazole (PRILOSEC) 20 MG capsule Take 20 mg by mouth daily.     [provider]  potassium chloride SA (KLOR-CON M20) 20 MEQ tablet TAKE 1 TABLET BY MOUTH EVERY DAY 06/06/20   [name], NP  Respiratory Therapy Supplies (FLUTTER) DEVI Use as directed 03/16/17   Juanito Doom, MD  Sennosides-Docusate Sodium (STOOL SOFTENER/LAXATIVE PO) Take 2 tablets by mouth daily.    [provider]  Sennosides-Docusate Sodium (STOOL SOFTENER/LAXATIVE PO) Take by mouth.    [provider]  simethicone (GAS-X) 80 MG chewable tablet Chew 1 tablet (80 mg total) by mouth every 6 (six) hours as needed for flatulence. 03/12/20   Yvonna Alanis, NP  Spacer/Aero-Holding [facility] DIAMOND) MISC optichamber [facility] 09/12/19   Olalere, Adewale A, MD  tamsulosin (FLOMAX) 0.4 MG CAPS capsule TAKE 1 CAPSULE(0.4 MG) BY MOUTH DAILY AFTER SUPPER 03/08/20   [name], NP  Tiotropium Bromide Monohydrate (SPIRIVA RESPIMAT) 2.5 MCG/ACT AERS Inhale 2 puffs into the lungs daily. 04/09/20   Fargo, Amy E, NP  vitamin B-12 (CYANOCOBALAMIN) 1000 MCG tablet Take 1 tablet (1,000 mcg total) by  mouth daily. 02/27/19   [name], NP  Allergies    Sulfa antibiotics   Review of Systems   Review of Systems A comprehensive review of systems was completed and negative except as noted in HPI.    Physical Exam BP (!) 156/100 (BP Location: Right Arm)   Pulse 63   Temp 97.9 F (36.6 C) (Oral)   Resp 20   Ht 6' (1.829 m)   Wt 97.5 kg   SpO2 95%   BMI 29.16 kg/m   Physical Exam Vitals and nursing note reviewed.  Constitutional:      Appearance: Normal appearance.  HENT:     Head: Normocephalic and atraumatic.     Nose: Nose normal.     Mouth/Throat:     Mouth: Mucous membranes are moist.  Eyes:     Extraocular Movements: Extraocular movements intact.     Conjunctiva/sclera: Conjunctivae normal.  Cardiovascular:     Rate and Rhythm: Normal rate.  Pulmonary:     Effort: Pulmonary effort is normal.     Breath sounds: Normal breath sounds.  Abdominal:     General: Abdomen is flat.     Palpations: Abdomen is soft.     Tenderness: There is no abdominal tenderness.  Musculoskeletal:        General: No swelling. Normal range of motion.     Cervical back: Neck supple.     Right lower leg: Edema present.     Left lower leg: No edema.  Skin:    General: Skin is warm and dry.     Findings: Bruising (extensive bruising in various stages of healing to the entirety of his extremities) present.     Comments: Multiple superficial skin tears to arms and legs, most fresh of which are in L thigh and R popliteal fossa, no signs of infection   Neurological:     General: No focal deficit present.     Mental Status: He is alert and oriented to person, place, and time.     Cranial Nerves: No cranial nerve deficit.     Sensory: No sensory deficit.     Motor: No weakness.     Coordination: Coordination normal.  Psychiatric:        Mood and Affect: Mood normal.      ED Results / Procedures / Treatments   Labs (all labs ordered are listed, but only abnormal results  are displayed) Labs Reviewed  BASIC METABOLIC PANEL - Abnormal; Notable for the following components:      Result Value   Glucose, Bld 102 (*)    All other components within normal limits  CBC WITH DIFFERENTIAL/PLATELET - Abnormal; Notable for the following components:   RBC 3.53 (*)    Hemoglobin 11.9 (*)    HCT 36.2 (*)    MCV 102.5 (*)    Abs Immature Granulocytes 0.08 (*)    All other components within normal limits  URINALYSIS, ROUTINE W REFLEX MICROSCOPIC    EKG EKG Interpretation  Date/Time:  Sunday July 07 2020 05:48:44 EDT Ventricular Rate:  61 PR Interval:  131 QRS Duration: 146 QT Interval:  509 QTC Calculation: 513 R Axis:   95 Text Interpretation: Sinus or ectopic atrial rhythm Atrial premature complex Nonspecific intraventricular conduction delay Abnormal inferior Q waves No significant change since last tracing Confirmed by Calvert Cantor [phone]) on 07/07/2020 5:58:01 AM    Radiology CT Head Wo Contrast  Result Date: 07/07/2020 CLINICAL DATA:  Headache with intracranial hemorrhage suspected EXAM: CT HEAD WITHOUT CONTRAST TECHNIQUE: Contiguous axial images were  obtained from the base of the skull through the vertex without intravenous contrast. COMPARISON:  None available for comparison FINDINGS: Brain: Small remote left frontal cortex infarct. Remote right PICA territory infarct. Smaller lateral left cerebellar infarct. Cerebral volume loss in keeping with aging. No evidence of acute infarct, hemorrhage, hydrocephalus, or mass Vascular: Atheromatous calcification. Skull: No acute finding Sinuses/Orbits: Chronic right maxillary sinusitis with partial opacification and sclerotic wall thickening. Bilateral cataract resection and right scleral banding. IMPRESSION: 1. No acute finding. 2. Remote cerebral and cerebellar infarcts. Electronically Signed   By: Monte Fantasia M.D.   On: 07/07/2020 06:30    Procedures Procedures  Medications Ordered in the  ED Medications - No data to display   MDM Rules/Calculators/A&P MDM Patient here with RLE edema, already on Eliquis but will need doppler to rule out breakthrough DVT. Will also check labs, EKG and head CT for the nonspecific dizziness he is describing.  ED Course  I have reviewed the triage vital signs and the nursing notes.  Pertinent labs & imaging results that were available during my care of the patient were reviewed by me and considered in my medical decision making (see chart for details).  Clinical Course as of 07/07/20 0646  Sun Jul 07, 2020  0618 CBC with mild anemia, not significantly changed from baseline. BMP is normal.  [CS]  6378 Reviewed CT images with patient. Chronic sinusitis and old infarcts particularly in cerebellum may affect his balance, but no signs of new stroke. He would like to go home to get something to eat and return for doppler later this morning when the [place] tech is here. He already has follow up scheduled for later this month.  [CS]    Clinical Course User Index [CS] Truddie Hidden, MD    Final Clinical Impression(s) / ED Diagnoses Final diagnoses:  Leg edema  Dizziness    Rx / DC Orders ED Discharge Orders         Ordered    US Venous Img Lower Unilateral Right        07/07/20 0646           Truddie Hidden, MD 07/07/20 [phone]

## 2020-07-09 NOTE — Progress Notes (Signed)
Cardiology Office Note:    Date:  07/10/2020   ID:  Steven Guzman, Steven Guzman [birth year], MRN [id number]  PCP:  [name], NP  Cardiologist:  Sinclair Grooms, MD   Referring MD: [name], NP   Chief Complaint  Patient presents with  . Congestive Heart Failure  . Coronary Artery Disease    History of Present Illness:    Steven Guzman is a 80 y.o. male with a hx of ischemic cardiomyopathy with EF 35%, COPD, hypertension, recent low blood pressures, CAD with prior coronary bypass grafting, continued tobacco abuse,PAF,ventricular tachycardiacontrolled on amiodarone and Mexitil, and implantable cardiac defibrillator.  Recent emergency room visit for left lower extremity swelling.  Doppler ultrasound was done.  No abnormalities were found.  He has had no pulmonary issues.  Some cough.  He stopped smoking in October when his dog got sick.  He has not had angina.  Lower extremity swelling is improved but still slightly larger in girth than the left lower extremity.  Past Medical History:  Diagnosis Date  . AICD (automatic cardioverter/defibrillator) present   . Aneurysm ([place])    a. Aneurysmal infrarenal aorta up to 33 mm on CT 10/2014, recommended f/u due 10/2017  . Anginal pain ([place])   . Anxiety   . Basal cell carcinoma of nose    S/P MOHS  . Biliary acute pancreatitis   . CAD (coronary artery disease)    a. s/p MI in 1994 with PCI to LAD at that time b. cath 10/2012 demonstrated EF 30%, inferior akinesis with mild hypokinesis of all walls, patent LAD and RCA stents; ostial PDA with 80-90% obstruction with medical therapy recommended   . Chronic systolic CHF (congestive heart failure) (HCC)    EF 30 to 35 % as of 09/2014.   [name] CKD (chronic kidney disease), stage III ([place])   . Complication of anesthesia 10/2014   "had to have defibrillator w/ERCP"  . COPD (chronic obstructive pulmonary disease) ([place])    a. followed by pulmonary, COPD GOLD stage II  . Depression    . Diverticulosis of colon 07/2014   noted on CT  . GERD (gastroesophageal reflux disease)   . Hiatal hernia   . Hyperglycemia 10/2012.  [name] Hyperlipidemia   . Hypertension   . Myocardial infarction [facility]) 1994; 2011  . Pneumonia 1946; 2015  . Prostate enlargement 07/2014   observed on CT  . Tobacco abuse   . Ventricular tachycardia ([place])    a. 08/2009 s/p BSX E110 Teligen 100 AICD, ser#: [id number];  b. 08/2008 VT req ATP - detection reprogrammed from 160 to 150. c. EPS and VT ablation by Dr. Lovena Le 12/21/2014    Past Surgical History:  Procedure Laterality Date  . BIOPSY  12/21/2017   Procedure: BIOPSY;  Surgeon: [name], MD;  Location: [facility];  Service: Endoscopy;;  . CATARACT EXTRACTION W/ INTRAOCULAR LENS  IMPLANT, BILATERAL Bilateral ~ 2011  . COLONOSCOPY    . COLONOSCOPY WITH PROPOFOL N/A 12/21/2017   Procedure: COLONOSCOPY WITH PROPOFOL;  Surgeon: [name], MD;  Location: [facility];  Service: Endoscopy;  Laterality: N/A;  . ELECTROPHYSIOLOGIC STUDY N/A 12/21/2014   Procedure: V Tach Ablation;  Surgeon: [name], MD;  Location: [place] [facility];  Service: Cardiovascular;  Laterality: N/A;  . ERCP N/A 11/16/2014   Procedure: ENDOSCOPIC RETROGRADE CHOLANGIOPANCREATOGRAPHY (ERCP);  Surgeon: [name], MD;  Location: [place];  Service: Endoscopy;  Laterality: N/A;  . ESOPHAGOGASTRODUODENOSCOPY (  EGD) WITH PROPOFOL N/A 12/21/2017   Procedure: ESOPHAGOGASTRODUODENOSCOPY (EGD) WITH PROPOFOL;  Surgeon: [name], MD;  Location: [facility];  Service: Endoscopy;  Laterality: N/A;  . EYE SURGERY    . FOOT SURGERY Left 2005   "fixed bone that stuck out in my ankle area"  . HEMORRHOID BANDING    . IMPLANTABLE CARDIOVERTER DEFIBRILLATOR IMPLANT  09/06/09   BSX dual chamber ICD implanted in [place] for cardiac arrest 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  . INGUINAL HERNIA REPAIR Right ~ 1995  . LEFT HEART CATHETERIZATION WITH CORONARY ANGIOGRAM N/A 11/25/2012   demonstrated  EF 30%, inferior akinesis with mild hypokinesis of all walls, patent LAD and RCA stents; ostial PDA with 80-90% obstruction with medical therapy recommended  . MOHS SURGERY  2008   nose, skin graft  . POLYPECTOMY  12/21/2017   Procedure: POLYPECTOMY;  Surgeon: [name], MD;  Location: [facility];  Service: Endoscopy;;  . RETINAL DETACHMENT SURGERY Right 2013  . TENOLYSIS Right 12/21/2013   Procedure: TENOLYSIS FLEXOR CARPI RADIALIS ,DEBRIDEMENT RIGHT JOINT WRIST,DEBRIDEMENT SCAPHOTRAPEZIAL TRAPEZOID, REPAIR OF EXTENSOR HOOD;  Surgeon: Daryll Brod, MD;  Location: [place];  Service: Orthopedics;  Laterality: Right;  . TOE SURGERY Right 09/2019   3rd toe/hammer toe  . V-TACH ABLATION  12/21/2014  . VIDEO BRONCHOSCOPY Bilateral 01/09/2016   Procedure: VIDEO BRONCHOSCOPY WITHOUT FLUORO;  Surgeon: Juanito Doom, MD;  Location: [facility];  Service: Cardiopulmonary;  Laterality: Bilateral;    Current Medications: Current Meds  Medication Sig  . acetaminophen (TYLENOL) 500 MG tablet Take 2 tablets by mouth daily.  [name] albuterol (PROVENTIL) (2.5 MG/3ML) 0.083% nebulizer solution USE 1 VIAL VIA NEBULIZER EVERY 6 HOURS AS NEEDED FOR WHEEZING OR SHORTNESS OF BREATH  . amiodarone (PACERONE) 200 MG tablet TAKE 1 TABLET BY MOUTH EVERY DAY  . apixaban (ELIQUIS) 5 MG TABS tablet Take 1 tablet (5 mg total) by mouth 2 (two) times daily.  . Artificial Tear Solution (SOOTHE XP OP) Place 2 drops into both eyes daily as needed (dry eyes).  [name] atorvastatin (LIPITOR) 80 MG tablet TAKE 1 TABLET(80 MG) BY MOUTH DAILY  . benazepril (LOTENSIN) 10 MG tablet TAKE 1 TABLET BY MOUTH EVERY DAY  . budesonide-formoterol (SYMBICORT) 160-4.5 MCG/ACT inhaler Inhale 2 puffs into the lungs 2 (two) times daily.  [name] buPROPion (WELLBUTRIN) 75 MG tablet TAKE 1 TABLET BY MOUTH EVERY DAY  . busPIRone (BUSPAR) 15 MG tablet Take 1 tablet (15 mg total) by mouth 2 (two) times daily.  . carvedilol (COREG) 3.125 MG  tablet TAKE 1 TABLET BY MOUTH TWICE A DAY  . cetirizine (ZYRTEC) 10 MG tablet Take 1 tablet by mouth daily as needed.  . Choline Fenofibrate (FENOFIBRIC ACID) 135 MG CPDR Take 1 tablet by mouth daily. Please keep upcoming appt in April 2022 with Dr. Tamala Julian before anymore refills. Thank you  . escitalopram (LEXAPRO) 20 MG tablet TAKE 1 TABLET BY MOUTH EVERY DAY  . finasteride (PROSCAR) 5 MG tablet Take 5 mg by mouth daily.  . fluticasone (FLONASE) 50 MCG/ACT nasal spray Place 2 sprays into both nostrils as needed.   . furosemide (LASIX) 40 MG tablet TAKE 1 TABLET(20 MG) BY MOUTH DAILY for 3 days, then resume 1/2 tablet daily  . guaiFENesin (MUCINEX) 600 MG 12 hr tablet Take 600 mg by mouth 2 (two) times daily.  . Ipratropium-Albuterol (COMBIVENT RESPIMAT) 20-100 MCG/ACT AERS respimat Inhale 1 puff into the lungs every 6 (six) hours. Shortness of breath or  wheezing  . IRON PO Take by mouth daily.   [name] MAGNESIUM-OXIDE 400 (241.3 Mg) MG tablet TAKE 1 TABSULE BY MOUTH DAILY  . mexiletine (MEXITIL) 200 MG capsule Take 1 capsule (200 mg total) by mouth 2 (two) times daily.  . Multiple Vitamins-Minerals (CENTRUM ADULTS PO) Take by mouth daily.  . nitroGLYCERIN (NITROSTAT) 0.4 MG SL tablet DISSOLVE 1 TABLET UNDER THE TONGUE EVERY 5 MINUTES FOR 3 DOSES AS NEEDED FOR CHEST PAIN  . omeprazole (PRILOSEC) 20 MG capsule Take 20 mg by mouth daily.   . potassium chloride SA (KLOR-CON M20) 20 MEQ tablet TAKE 1 TABLET BY MOUTH EVERY DAY  . Respiratory Therapy Supplies (FLUTTER) DEVI Use as directed  . Sennosides-Docusate Sodium (STOOL SOFTENER/LAXATIVE PO) Take 2 tablets by mouth daily.  . simethicone (GAS-X) 80 MG chewable tablet Chew 1 tablet (80 mg total) by mouth every 6 (six) hours as needed for flatulence.  [name] Spacer/Aero-Holding Chambers (OPTICHAMBER DIAMOND) MISC optichamber VHC  . tamsulosin (FLOMAX) 0.4 MG CAPS capsule TAKE 1 CAPSULE(0.4 MG) BY MOUTH DAILY AFTER SUPPER  . Tiotropium Bromide Monohydrate  (SPIRIVA RESPIMAT) 2.5 MCG/ACT AERS Inhale 2 puffs into the lungs daily.  . vitamin B-12 (CYANOCOBALAMIN) 1000 MCG tablet Take 1 tablet (1,000 mcg total) by mouth daily.  . [DISCONTINUED] aspirin EC 81 MG tablet Take 1 tablet (81 mg total) by mouth every Monday, Wednesday, and Friday.     Allergies:   Sulfa antibiotics   Social History   Socioeconomic History  . Marital status: Married    Spouse name: Not on file  . Number of children: Not on file  . Years of education: Not on file  . Highest education level: Not on file  Occupational History  . Occupation: Retired  Tobacco Use  . Smoking status: Former Smoker    Packs/day: 1.00    Years: 55.00    Pack years: 55.00    Types: Cigarettes  . Smokeless tobacco: Never Used  . Tobacco comment: off/on, always ready to quit but does not work out  Media planner  . Vaping Use: Never used  Substance and Sexual Activity  . Alcohol use: Yes    Alcohol/week: 0.0 standard drinks    Comment: occ  . Drug use: No  . Sexual activity: Not Currently  Other Topics Concern  . Not on file  Social History Narrative  . Not on file   Social Determinants of Health   Financial Resource Strain: Not on file  Food Insecurity: Not on file  Transportation Needs: Not on file  Physical Activity: Not on file  Stress: Not on file  Social Connections: Not on file     Family History: The patient's family history includes CAD in his father and mother; Heart attack in his brother; Hypertension in his brother, father, and mother. There is no history of Stroke.  ROS:   Please see the history of present illness.    Easy bruising and bleeding.  Paperthin skin.  All other systems reviewed and are negative.  EKGs/Labs/Other Studies Reviewed:    The following studies were reviewed today:  Right lower extremity venous Doppler 07/07/2020: IMPRESSION: No evidence of right lower extremity deep venous thrombosis.  EKG:  EKG normal sinus rhythm, left bundle  branch block.  No acute change.  Tracing performed 07/08/2020.  Recent Labs: 06/07/2020: ALT 67; TSH 2.290 07/07/2020: BUN 21; Creatinine, Ser 1.11; Hemoglobin 11.9; Platelets 167; Potassium 3.7; Sodium 136  Recent Lipid Panel    Component Value Date/Time  CHOL 136 03/01/2020 1344   CHOL 157 09/21/2017 1546   TRIG 71 03/01/2020 1344   HDL 54 03/01/2020 1344   HDL 46 09/21/2017 1546   CHOLHDL 2.5 03/01/2020 1344   VLDL 16.6 12/29/2016 1618   LDLCALC 67 03/01/2020 1344    Physical Exam:    VS:  BP 118/66   Pulse 68   Ht 6' (1.829 m)   Wt 216 lb 9.6 oz (98.2 kg)   SpO2 92%   BMI 29.38 kg/m     Wt Readings from Last 3 Encounters:  07/10/20 216 lb 9.6 oz (98.2 kg)  07/07/20 215 lb (97.5 kg)  06/07/20 213 lb 12.8 oz (97 kg)     GEN: Diffuse ecchymoses and areas of skin trauma with eschar.. No acute distress HEENT: Normal NECK: No JVD. LYMPHATICS: No lymphadenopathy CARDIAC: No murmur. RRR no gallop, or edema. VASCULAR:  Normal Pulses. No bruits. RESPIRATORY:  Clear to auscultation without rales, wheezing or rhonchi  ABDOMEN: Soft, non-tender, non-distended, No pulsatile mass, MUSCULOSKELETAL: No deformity  SKIN: Warm and dry NEUROLOGIC:  Alert and oriented x 3 PSYCHIATRIC:  Normal affect   ASSESSMENT:    1. Chronic systolic heart failure ([place])   2. VT (ventricular tachycardia) ([place])   3. Coronary artery disease involving native coronary artery of native heart without angina pectoris   4. Paroxysmal atrial fibrillation (HCC)   5. ICD (implantable cardioverter-defibrillator) in place   6. Essential hypertension    PLAN:    In order of problems listed above:  1. No evidence of volume overload on exam.  Continue heart failure therapy which includes angiotensin-converting enzyme inhibitor, carvedilol, furosemide, and salt restriction.  We have an opportunity to add SGLT2 and MRA.  We have had difficulty adding anything in the past because of hypotension.  We will  discuss this at next office visit. 2. He continues on Mexitil and amiodarone for control of V. Tach. 3.   Secondary prevention discussed. 4. Continue amiodarone. 5. Follows in device clinic. 6. Blood pressure control is excellent and actually a hindrance to up titration of heart failure therapy.  Overall education and awareness concerning primary/secondary risk prevention was discussed in detail: LDL less than 70, hemoglobin A1c less than 7, blood pressure target less than 130/80 mmHg, >150 minutes of moderate aerobic activity per week, avoidance of smoking, weight control (via diet and exercise), and continued surveillance/management of/for obstructive sleep apnea.  Guideline directed therapy for left ventricular systolic dysfunction: Angiotensin receptor-neprilysin inhibitor (ARNI)-Entresto; beta-blocker therapy - carvedilol, metoprolol succinate, or bisoprolol; mineralocorticoid receptor antagonist (MRA) therapy -spironolactone or eplerenone.  SGLT-2 agents -  Dapagliflozin [place]) or Empagliflozin ([place]).These therapies have been shown to improve clinical outcomes including reduction of rehospitalization, survival, and acute heart failure.    Medication Adjustments/Labs and Tests Ordered: Current medicines are reviewed at length with the patient today.  Concerns regarding medicines are outlined above.  No orders of the defined types were placed in this encounter.  No orders of the defined types were placed in this encounter.   Patient Instructions  Medication Instructions:  1) DISCONTINUE Aspirin  *If you need a refill on your cardiac medications before your next appointment, please call your pharmacy*   Lab Work: None If you have labs (blood work) drawn today and your tests are completely normal, you will receive your results only by: [name] MyChart Message (if you have MyChart) OR . A paper copy in the mail If you have any lab test that is abnormal or  we need to change your  treatment, we will call you to review the results.   Testing/Procedures: None   Follow-Up: At [facility], you and your health needs are our priority.  As part of our continuing mission to provide you with exceptional heart care, we have created designated Provider Care Teams.  These Care Teams include your primary Cardiologist (physician) and Advanced Practice Providers (APPs -  Physician Assistants and Nurse Practitioners) who all work together to provide you with the care you need, when you need it.  We recommend signing up for the patient portal called "MyChart".  Sign up information is provided on this After Visit Summary.  MyChart is used to connect with patients for Virtual Visits (Telemedicine).  Patients are able to view lab/test results, encounter notes, upcoming appointments, etc.  Non-urgent messages can be sent to your provider as well.   To learn more about what you can do with MyChart, go to [email or web address].    Your next appointment:   8-12 month(s)  The format for your next appointment:   In Person  Provider:   You may see Sinclair Grooms, MD or one of the following Advanced Practice Providers on your designated Care Team:    Kathyrn Drown, NP    Other Instructions      Signed, Sinclair Grooms, MD  07/10/2020 4:26 PM    [place]

## 2020-07-10 ENCOUNTER — Ambulatory Visit: Payer: Medicare Other | Admitting: Interventional Cardiology

## 2020-07-10 ENCOUNTER — Other Ambulatory Visit: Payer: Self-pay

## 2020-07-10 ENCOUNTER — Encounter: Payer: Self-pay | Admitting: Interventional Cardiology

## 2020-07-10 VITALS — BP 118/66 | HR 68 | Ht 72.0 in | Wt 216.6 lb

## 2020-07-10 DIAGNOSIS — I472 Ventricular tachycardia, unspecified: Secondary | ICD-10-CM

## 2020-07-10 DIAGNOSIS — Z95810 Presence of automatic (implantable) cardiac defibrillator: Secondary | ICD-10-CM

## 2020-07-10 DIAGNOSIS — I2510 Atherosclerotic heart disease of native coronary artery without angina pectoris: Secondary | ICD-10-CM | POA: Diagnosis not present

## 2020-07-10 DIAGNOSIS — I10 Essential (primary) hypertension: Secondary | ICD-10-CM

## 2020-07-10 DIAGNOSIS — I5022 Chronic systolic (congestive) heart failure: Secondary | ICD-10-CM

## 2020-07-10 DIAGNOSIS — I480 Paroxysmal atrial fibrillation: Secondary | ICD-10-CM

## 2020-07-10 NOTE — Patient Instructions (Signed)
Medication Instructions:  1) DISCONTINUE Aspirin  *If you need a refill on your cardiac medications before your next appointment, please call your pharmacy*   Lab Work: None If you have labs (blood work) drawn today and your tests are completely normal, you will receive your results only by: [name] MyChart Message (if you have MyChart) OR . A paper copy in the mail If you have any lab test that is abnormal or we need to change your treatment, we will call you to review the results.   Testing/Procedures: None   Follow-Up: At [facility], you and your health needs are our priority.  As part of our continuing mission to provide you with exceptional heart care, we have created designated Provider Care Teams.  These Care Teams include your primary Cardiologist (physician) and Advanced Practice Providers (APPs -  Physician Assistants and Nurse Practitioners) who all work together to provide you with the care you need, when you need it.  We recommend signing up for the patient portal called "MyChart".  Sign up information is provided on this After Visit Summary.  MyChart is used to connect with patients for Virtual Visits (Telemedicine).  Patients are able to view lab/test results, encounter notes, upcoming appointments, etc.  Non-urgent messages can be sent to your provider as well.   To learn more about what you can do with MyChart, go to [email or web address].    Your next appointment:   8-12 month(s)  The format for your next appointment:   In Person  Provider:   You may see Sinclair Grooms, MD or one of the following Advanced Practice Providers on your designated Care Team:    Kathyrn Drown, NP    Other Instructions

## 2020-07-11 ENCOUNTER — Telehealth: Payer: Self-pay | Admitting: Emergency Medicine

## 2020-07-11 NOTE — Telephone Encounter (Signed)
Patient received short ATP therapy for a slow V-tach episode. Dr. Lovena Le in office and reviewed transmission. Dr. Lovena Le advised no driving restrictions.

## 2020-07-14 ENCOUNTER — Other Ambulatory Visit: Payer: Self-pay | Admitting: Nurse Practitioner

## 2020-07-16 ENCOUNTER — Ambulatory Visit: Payer: Medicare Other | Admitting: Family

## 2020-07-16 ENCOUNTER — Encounter: Payer: Self-pay | Admitting: Family

## 2020-07-16 ENCOUNTER — Other Ambulatory Visit: Payer: Self-pay

## 2020-07-16 VITALS — BP 119/78 | HR 64 | Temp 98.1°F | Resp 20 | Ht 72.0 in | Wt 214.4 lb

## 2020-07-16 DIAGNOSIS — R600 Localized edema: Secondary | ICD-10-CM | POA: Diagnosis not present

## 2020-07-16 DIAGNOSIS — T148XXA Other injury of unspecified body region, initial encounter: Secondary | ICD-10-CM | POA: Diagnosis not present

## 2020-07-16 NOTE — Patient Instructions (Addendum)
-   cleanse both legs with warm water and dial soap daily pat dry.  - Cleanse all skin tears with saline,pat dry and apply triple antibiotics ointment and cover with foam dressing or non-adhesive gauze.change all dressing daily.   - Apply knee high compression stocking on in the morning and remove at bed time for leg edema.Do not sleep with compression stockings on.  - Keep legs elevated

## 2020-07-16 NOTE — Progress Notes (Signed)
Provider: Becca Bayne FNP-C  Yvonna Alanis, NP  Patient Care Team: Yvonna Alanis, NP as PCP - General (Adult Health Nurse Practitioner) Belva Crome, MD as PCP - Cardiology (Cardiology) [name], MD as PCP - Electrophysiology (Cardiology) Roel Cluck, MD as Referring Physician (Ophthalmology) Harriett Sine, MD as Consulting Physician (Dermatology)  Extended Emergency Contact Information Primary Emergency Contact: Mauney,[name] Address: [address], [place] [address] [place] Phone: [phone] Mobile Phone: [phone] Relation: Spouse Secondary Emergency Contact: [name], [place] [address] [name] Phone: [phone] Mobile Phone: [phone] Relation: Relative  Code Status:  Full Code  Goals of care: Advanced Directive information Advanced Directives 07/16/2020  Does Patient Have a Medical Advance Directive? No  Does patient want to make changes to medical advance directive? -  Would patient like information on creating a medical advance directive? No - Patient declined     Chief Complaint  Patient presents with  . Acute Visit    Swollen Left Leg with Odor    HPI:  Pt is a 80 y.o. male seen today for an acute visit for evaluation of left leg swelling and odor.Has multiple scattered skin tears and scabs of different stages.states on a Eliquis that makes him easily bruising.He recently moved from an apartment in [place] to another apartment in [place].Has been unpacking boxes gets bruises and skin tears with slight bumping on boxes.cleanse skin tears and abrasion with hydrogen peroxide and applies band Aid but not changing daily.He denies any fever,chills or drainage from skin tears. He was seen in ED 07/07/2020 for leg edema.He had venous doppler ultrasound which was negative for DVT.His lab worker was normal except slight anemia Hgb 11.9 He states  right leg still swollen more than the left leg.Not wearing compression stocking consistently. has had Stocking for a long time and seems to be more loose not helping.   Has had no abrupt weight gain.Has chronic cough and wheezing from COPD symptoms has not worsen.    Past Medical History:  Diagnosis Date  . AICD (automatic cardioverter/defibrillator) present   . Aneurysm ([facility])    a. Aneurysmal infrarenal aorta up to 33 mm on CT 10/2014, recommended f/u due 10/2017  . Anginal pain ([place])   . Anxiety   . Basal cell carcinoma of nose    S/P MOHS  . Biliary acute pancreatitis   . CAD (coronary artery disease)    a. s/p MI in 1994 with PCI to LAD at that time b. cath 10/2012 demonstrated EF 30%, inferior akinesis with mild hypokinesis of all walls, patent LAD and RCA stents; ostial PDA with 80-90% obstruction with medical therapy recommended   . Chronic systolic CHF (congestive heart failure) (H[facility])    EF 30 to 35 % as of 09/2014.   [name] CKD (chronic kidney disease), stage III ([facility])   . Complication of anesthesia 10/2014   "had to have defibrillator w/ERCP"  . COPD (chronic obstructive pulmonary disease) ([facility])    a. followed by pulmonary, COPD GOLD stage II  . Depression   . Diverticulosis of colon 07/2014   noted on CT  . GERD (gastroesophageal reflux disease)   . Hiatal hernia   . Hyperglycemia 10/2012.  [name] Hyperlipidemia   . Hypertension   . Myocardial infarction ([facility]) 1994;  2011  . Pneumonia 1946; 2015  . Prostate enlargement 07/2014   observed on CT  . Tobacco abuse   . Ventricular tachycardia ([facility])    a. 08/2009 s/p BSX E110 Teligen 100 AICD, ser#: [id number];  b. 08/2008 VT req ATP - detection reprogrammed from 160 to 150. c. EPS and VT ablation by Dr. Lovena Le 12/21/2014   Past Surgical History:  Procedure Laterality Date  . BIOPSY  12/21/2017   Procedure: BIOPSY;  Surgeon: [name], MD;  Location: [facility];  Service: Endoscopy;;  . CATARACT EXTRACTION W/ INTRAOCULAR LENS  IMPLANT,  BILATERAL Bilateral ~ 2011  . COLONOSCOPY    . COLONOSCOPY WITH PROPOFOL N/A 12/21/2017   Procedure: COLONOSCOPY WITH PROPOFOL;  Surgeon: [name], MD;  Location: [facility];  Service: Endoscopy;  Laterality: N/A;  . ELECTROPHYSIOLOGIC STUDY N/A 12/21/2014   Procedure: V Tach Ablation;  Surgeon: [name], MD;  Location: [facility];  Service: Cardiovascular;  Laterality: N/A;  . ERCP N/A 11/16/2014   Procedure: ENDOSCOPIC RETROGRADE CHOLANGIOPANCREATOGRAPHY (ERCP);  Surgeon: [name], MD;  Location: [facility];  Service: Endoscopy;  Laterality: N/A;  . ESOPHAGOGASTRODUODENOSCOPY (EGD) WITH PROPOFOL N/A 12/21/2017   Procedure: ESOPHAGOGASTRODUODENOSCOPY (EGD) WITH PROPOFOL;  Surgeon: [name], MD;  [facility]: [facility];  Service: Endoscopy;  Laterality: N/A;  . EYE SURGERY    . FOOT SURGERY Left 2005   "fixed bone that stuck out in my ankle area"  . HEMORRHOID BANDING    . IMPLANTABLE CARDIOVERTER DEFIBRILLATOR IMPLANT  09/06/09   BSX dual chamber ICD implanted in [place] for cardiac arrest 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  . INGUINAL HERNIA REPAIR Right ~ 1995  . LEFT HEART CATHETERIZATION WITH CORONARY ANGIOGRAM N/A 11/25/2012   demonstrated EF 30%, inferior akinesis with mild hypokinesis of all walls, patent LAD and RCA stents; ostial PDA with 80-90% obstruction with medical therapy recommended  . MOHS SURGERY  2008   nose, skin graft  . POLYPECTOMY  12/21/2017   Procedure: POLYPECTOMY;  Surgeon: [name], MD;  Location: [facility];  Service: Endoscopy;;  . RETINAL DETACHMENT SURGERY Right 2013  . TENOLYSIS Right 12/21/2013   Procedure: TENOLYSIS FLEXOR CARPI RADIALIS ,DEBRIDEMENT RIGHT JOINT WRIST,DEBRIDEMENT SCAPHOTRAPEZIAL TRAPEZOID, REPAIR OF EXTENSOR HOOD;  Surgeon: Daryll Brod, MD;  Location: [facility];  Service: Orthopedics;  Laterality: Right;  . TOE SURGERY Right 09/2019   3rd toe/hammer toe  . V-TACH ABLATION  12/21/2014  . VIDEO  BRONCHOSCOPY Bilateral 01/09/2016   Procedure: VIDEO BRONCHOSCOPY WITHOUT FLUORO;  Surgeon: Juanito Doom, MD;  [facility];  Service: Cardiopulmonary;  Laterality: Bilateral;    Allergies  Allergen Reactions  . Sulfa Antibiotics Hives    Outpatient Encounter Medications as of 07/16/2020  Medication Sig  . acetaminophen (TYLENOL) 500 MG tablet Take 2 tablets by mouth daily.  [name] albuterol (PROVENTIL) (2.5 MG/3ML) 0.083% nebulizer solution USE 1 VIAL VIA NEBULIZER EVERY 6 HOURS AS NEEDED FOR WHEEZING OR SHORTNESS OF BREATH  . amiodarone (PACERONE) 200 MG tablet TAKE 1 TABLET BY MOUTH EVERY DAY  . apixaban (ELIQUIS) 5 MG TABS tablet Take 1 tablet (5 mg total) by mouth 2 (two) times daily.  . Artificial Tear Solution (SOOTHE XP OP) Place 2 drops into both eyes daily as needed (dry eyes).  [name] atorvastatin (LIPITOR) 80 MG tablet TAKE 1 TABLET(80 MG) BY MOUTH DAILY  . benazepril (LOTENSIN) 10 MG tablet TAKE 1 TABLET BY MOUTH EVERY DAY  . budesonide-formoterol (  SYMBICORT) 160-4.5 MCG/ACT inhaler Inhale 2 puffs into the lungs 2 (two) times daily.  [name] buPROPion (WELLBUTRIN) 75 MG tablet TAKE 1 TABLET BY MOUTH EVERY DAY  . busPIRone (BUSPAR) 15 MG tablet TAKE 1 TABLET BY MOUTH 2 TIMES DAILY.  . carvedilol (COREG) 3.125 MG tablet TAKE 1 TABLET BY MOUTH TWICE A DAY  . cetirizine (ZYRTEC) 10 MG tablet Take 1 tablet by mouth daily as needed.  . Choline Fenofibrate (FENOFIBRIC ACID) 135 MG CPDR Take 1 tablet by mouth daily. Please keep upcoming appt in April 2022 with Dr. Tamala Julian before anymore refills. Thank you  . escitalopram (LEXAPRO) 20 MG tablet TAKE 1 TABLET BY MOUTH EVERY DAY  . finasteride (PROSCAR) 5 MG tablet Take 5 mg by mouth daily.  . fluticasone (FLONASE) 50 MCG/ACT nasal spray Place 2 sprays into both nostrils as needed.   . furosemide (LASIX) 40 MG tablet TAKE 1 TABLET(20 MG) BY MOUTH DAILY for 3 days, then resume 1/2 tablet daily  . guaiFENesin (MUCINEX) 600 MG 12 hr tablet  Take 600 mg by mouth 2 (two) times daily.  . Ipratropium-Albuterol (COMBIVENT RESPIMAT) 20-100 MCG/ACT AERS respimat Inhale 1 puff into the lungs every 6 (six) hours. Shortness of breath or wheezing  . IRON PO Take by mouth daily.   [name] MAGNESIUM-OXIDE 400 (241.3 Mg) MG tablet TAKE 1 TABSULE BY MOUTH DAILY  . mexiletine (MEXITIL) 200 MG capsule Take 1 capsule (200 mg total) by mouth 2 (two) times daily.  . Multiple Vitamins-Minerals (CENTRUM ADULTS PO) Take by mouth daily.  . nitroGLYCERIN (NITROSTAT) 0.4 MG SL tablet DISSOLVE 1 TABLET UNDER THE TONGUE EVERY 5 MINUTES FOR 3 DOSES AS NEEDED FOR CHEST PAIN  . omeprazole (PRILOSEC) 20 MG capsule Take 20 mg by mouth daily.   . potassium chloride SA (KLOR-CON M20) 20 MEQ tablet TAKE 1 TABLET BY MOUTH EVERY DAY  . Respiratory Therapy Supplies (FLUTTER) DEVI Use as directed  . Sennosides-Docusate Sodium (STOOL SOFTENER/LAXATIVE PO) Take 2 tablets by mouth daily.  . simethicone (GAS-X) 80 MG chewable tablet Chew 1 tablet (80 mg total) by mouth every 6 (six) hours as needed for flatulence.  [name] Spacer/Aero-Holding Chambers (OPTICHAMBER DIAMOND) MISC optichamber VHC  . tamsulosin (FLOMAX) 0.4 MG CAPS capsule TAKE 1 CAPSULE(0.4 MG) BY MOUTH DAILY AFTER SUPPER  . Tiotropium Bromide Monohydrate (SPIRIVA RESPIMAT) 2.5 MCG/ACT AERS Inhale 2 puffs into the lungs daily.  . vitamin B-12 (CYANOCOBALAMIN) 1000 MCG tablet Take 1 tablet (1,000 mcg total) by mouth daily.   No facility-administered encounter medications on file as of 07/16/2020.    Review of Systems  Constitutional: Negative for appetite change, chills, fatigue, fever and unexpected weight change.  Respiratory: Negative for chest tightness and shortness of breath.        COPD symptoms has not worsen   Cardiovascular: Positive for leg swelling. Negative for chest pain and palpitations.  Gastrointestinal: Negative for abdominal distention, abdominal pain, blood in stool, diarrhea, nausea and vomiting.   Musculoskeletal: Positive for gait problem. Negative for joint swelling.  Skin: Negative for pallor, rash and wound.       Skin tears per HPI   Neurological: Negative for dizziness, syncope, speech difficulty, weakness, light-headedness, numbness and headaches.  Hematological: Bruises/bleeds easily.    Immunization History  Administered Date(s) Administered  . Fluad Quad(high Dose 65+) 12/14/2018  . Influenza, High Dose Seasonal PF 12/17/2012, 01/06/2016, 12/21/2016, 12/18/2019  . Influenza,inj,Quad PF,6+ Mos 12/25/2013, 12/22/2014  . Influenza,trivalent, recombinat, inj, PF 12/16/2017  . Influenza-Unspecified 12/28/2017,  12/18/2019  . PFIZER(Purple Top)SARS-COV-2 Vaccination 04/19/2019, 05/10/2019, 01/17/2020  . Pneumococcal Conjugate-13 05/11/2014  . Pneumococcal-Unspecified 10/17/2010  . Tdap 05/11/2014  . Zoster 03/30/2008   Pertinent  Health Maintenance Due  Topic Date Due  . INFLUENZA VACCINE  10/28/2020  . PNA vac Low Risk Adult  Completed   Fall Risk  07/16/2020 05/10/2020 03/01/2020 02/28/2020 02/05/2020  Falls in the past year? 0 1 1 1  0  Comment - - - - -  Number falls in past yr: 0 0 0 0 0  Comment - - 1 - -  Injury with Fall? 0 0 (No Data) 0 0  Comment - - skin tears that blead in august 2021 - -  Risk for fall due to : - - - - -   Functional Status Survey:    Vitals:   07/16/20 1534  BP: 119/78  Pulse: 64  Resp: 20  Temp: 98.1 F (36.7 C)  TempSrc: Temporal  SpO2: 93%  Weight: 214 lb 6.4 oz (97.3 kg)  Height: 6' (1.829 m)   Body mass index is 29.08 kg/m. Physical Exam Vitals reviewed.  Constitutional:      General: He is not in acute distress.    Appearance: Normal appearance. He is overweight. He is not ill-appearing or diaphoretic.  HENT:     Head: Normocephalic.     Mouth/Throat:     Mouth: Mucous membranes are moist.     Pharynx: Oropharynx is clear. No oropharyngeal exudate or posterior oropharyngeal erythema.  Eyes:     General: No  scleral icterus.       Right eye: No discharge.        Left eye: No discharge.     Conjunctiva/sclera: Conjunctivae normal.     Pupils: Pupils are equal, round, and reactive to light.  Cardiovascular:     Rate and Rhythm: Normal rate and regular rhythm.     Pulses: Normal pulses.     Heart sounds: Normal heart sounds. No murmur heard. No friction rub. No gallop.   Pulmonary:     Effort: Pulmonary effort is normal. No respiratory distress.     Breath sounds: Normal breath sounds. No wheezing, rhonchi or rales.  Chest:     Chest wall: No tenderness.  Abdominal:     General: Bowel sounds are normal. There is no distension.     Palpations: Abdomen is soft. There is no mass.     Tenderness: There is no abdominal tenderness. There is no right CVA tenderness, left CVA tenderness, guarding or rebound.  Musculoskeletal:        General: No swelling or tenderness.     Right lower leg: Edema present.     Left lower leg: Edema present.     Comments: Unsteady gait ambulates with a walker Bilateral lower extremities 2+ edema right > left.   Skin:    General: Skin is warm and dry.     Coloration: Skin is not pale.     Findings: No bruising, lesion or rash.     Comments: Multiple supervisual skin tears and scabs on legs and few on the forearms of different stages.skin tears on legs cleansed with saline,pat dry,triple antibiotic ointment applied and covered with foam dressing for extra protection.several old bandage were saturated with old  drainage and had odor.No signs of infection noted.    Neurological:     Mental Status: He is alert and oriented to person, place, and time.     Cranial Nerves: No cranial  nerve deficit.     Sensory: No sensory deficit.     Motor: No weakness.     Coordination: Coordination normal.     Gait: Gait normal.  Psychiatric:        Mood and Affect: Mood normal.        Speech: Speech normal.        Behavior: Behavior normal.        Thought Content: Thought content  normal.        Judgment: Judgment normal.     Labs reviewed: Recent Labs    03/01/20 1344 06/07/20 1434 07/07/20 0554  NA 139 142 136  K 4.4 4.4 3.7  CL 104 102 102  CO2 25 23 26   GLUCOSE 84 90 102*  BUN 11 11 21   CREATININE 1.08 1.08 1.11  CALCIUM 9.4 9.5 9.2   Recent Labs    03/01/20 1344 06/07/20 1434  AST 34 61*  ALT 45 67*  ALKPHOS  --  54  BILITOT 0.6 0.8  PROT 6.4 6.6  ALBUMIN  --  4.3   Recent Labs    03/01/20 1344 06/07/20 1434 07/07/20 0554  WBC 5.8 5.1 4.2  NEUTROABS 3,921  --  2.4  HGB 13.2 13.0 11.9*  HCT 39.4 38.0 36.2*  MCV 99.2 98* 102.5*  PLT 185 177 167   Lab Results  Component Value Date   TSH 2.290 06/07/2020   Lab Results  Component Value Date   HGBA1C 5.6 12/29/2016   Lab Results  Component Value Date   CHOL 136 03/01/2020   HDL 54 03/01/2020   LDLCALC 67 03/01/2020   TRIG 71 03/01/2020   CHOLHDL 2.5 03/01/2020    Significant Diagnostic Results in last 30 days:  CT Head Wo Contrast  Result Date: 07/07/2020 CLINICAL DATA:  Headache with intracranial hemorrhage suspected EXAM: CT HEAD WITHOUT CONTRAST TECHNIQUE: Contiguous axial images were obtained from the base of the skull through the vertex without intravenous contrast. COMPARISON:  None available for comparison FINDINGS: Brain: Small remote left frontal cortex infarct. Remote right PICA territory infarct. Smaller lateral left cerebellar infarct. Cerebral volume loss in keeping with aging. No evidence of acute infarct, hemorrhage, hydrocephalus, or mass Vascular: Atheromatous calcification. Skull: No acute finding Sinuses/Orbits: Chronic right maxillary sinusitis with partial opacification and sclerotic wall thickening. Bilateral cataract resection and right scleral banding. IMPRESSION: 1. No acute finding. 2. Remote cerebral and cerebellar infarcts. Electronically Signed   By: Monte Fantasia M.D.   On: 07/07/2020 06:30   US Venous Img Lower Unilateral Right  Result Date:  07/07/2020 CLINICAL DATA:  80 year old male with right lower extremity swelling for 2 days. EXAM: RIGHT LOWER EXTREMITY VENOUS DOPPLER ULTRASOUND TECHNIQUE: Gray-scale sonography with graded compression, as well as color Doppler and duplex ultrasound were performed to evaluate the right lower extremity deep venous systems from the level of the common femoral vein and including the common femoral, femoral, profunda femoral, popliteal and calf veins including the posterior tibial, peroneal and gastrocnemius veins when visible. Spectral Doppler was utilized to evaluate flow at rest and with distal augmentation maneuvers in the common femoral, femoral and popliteal veins. The contralateral common femoral vein was also evaluated for comparison. COMPARISON:  None. FINDINGS: RIGHT LOWER EXTREMITY Common Femoral Vein: No evidence of thrombus. Normal compressibility, respiratory phasicity and response to augmentation. Central Greater Saphenous Vein: No evidence of thrombus. Normal compressibility and flow on color Doppler imaging. Central Profunda Femoral Vein: No evidence of thrombus. Normal compressibility and flow on  color Doppler imaging. Femoral Vein: No evidence of thrombus. Normal compressibility, respiratory phasicity and response to augmentation. Popliteal Vein: No evidence of thrombus. Normal compressibility, respiratory phasicity and response to augmentation. Calf Veins: No evidence of thrombus. Normal compressibility and flow on color Doppler imaging. Other Findings:  None. LEFT LOWER EXTREMITY Common Femoral Vein: No evidence of thrombus. Normal compressibility, respiratory phasicity and response to augmentation. IMPRESSION: No evidence of right lower extremity deep venous thrombosis. Ruthann Cancer, MD [facility] Electronically Signed   By: Ruthann Cancer MD   On: 07/07/2020 12:48    Assessment/Plan  1. Edema of both lower extremities 2+ edema to  lower extremities no signs of fluid overload.Recent venous ultrasound was negative for DVT. - Continue on Furosemide  - Advised to Apply knee high compression stocking on in the morning and remove at bed time for leg edema.Do not sleep with compression stockings on. - Keep legs elevated  - Notify provider if edema worsen or cough,shortness of breath or wheezing  - Compression stockings - follow up in 2 weeks to evaluate edema   2. Multiple skin tears Afebrile. Multiple supervisual skin tears and scabs on legs and few on the forearms of different stages.skin tears on legs cleansed with saline,pat dry,triple antibiotic ointment applied and covered with foam dressing for extra protection.several old bandage were saturated with old  drainage and had odor.No signs of infection noted.  - Advised to cleanse both legs with warm water and dial soap daily pat dry. - Cleanse all skin tears with saline,pat dry and apply triple antibiotics ointment and cover with foam dressing or non-adhesive gauze.change all dressing daily.   Family/ staff Communication: Reviewed plan of care with patient verbalized understanding   Labs/tests ordered: None   Next Appointment: 2 weeks to evaluate leg edema.   Sandrea Hughs, NP

## 2020-07-20 ENCOUNTER — Other Ambulatory Visit: Payer: Self-pay | Admitting: Internal Medicine

## 2020-07-22 ENCOUNTER — Ambulatory Visit: Payer: Medicare Other | Admitting: Interventional Cardiology

## 2020-07-22 NOTE — Telephone Encounter (Signed)
Pt's age 80, wt 97.3 kg, SCr 1.11, CrCl 71.83, last ov w/ HS 07/10/20.

## 2020-07-28 ENCOUNTER — Other Ambulatory Visit: Payer: Self-pay | Admitting: Nurse Practitioner

## 2020-07-28 ENCOUNTER — Other Ambulatory Visit: Payer: Self-pay | Admitting: Interventional Cardiology

## 2020-08-06 ENCOUNTER — Other Ambulatory Visit: Payer: Self-pay | Admitting: Nurse Practitioner

## 2020-08-06 NOTE — Telephone Encounter (Signed)
Pharmacy requested refill Pended Rx and sent to [facility] for approval due to [place],

## 2020-08-19 ENCOUNTER — Ambulatory Visit: Payer: Medicare Other

## 2020-08-21 ENCOUNTER — Other Ambulatory Visit: Payer: Self-pay | Admitting: Internal Medicine

## 2020-09-01 ENCOUNTER — Other Ambulatory Visit: Payer: Self-pay | Admitting: Nurse Practitioner

## 2020-09-01 ENCOUNTER — Other Ambulatory Visit: Payer: Self-pay | Admitting: Interventional Cardiology

## 2020-09-01 DIAGNOSIS — I472 Ventricular tachycardia, unspecified: Secondary | ICD-10-CM

## 2020-09-06 ENCOUNTER — Telehealth: Payer: Self-pay

## 2020-09-06 DIAGNOSIS — I472 Ventricular tachycardia, unspecified: Secondary | ICD-10-CM

## 2020-09-06 NOTE — Telephone Encounter (Signed)
Spoke with pharmacy and made them aware that pt has been on both medications for quite some time with no issues.  Pharmacy appreciative for call.

## 2020-09-06 NOTE — Telephone Encounter (Signed)
[facility] pharmacy is stating that there is a interaction between medication escitalopram and amiodarone, QT prolongation. Pharmacist wanted to make sure that Dr. Tamala Julian is aware of this interaction and would like for pt to still take medication. Please address

## 2020-09-10 ENCOUNTER — Other Ambulatory Visit: Payer: Self-pay

## 2020-09-10 MED ORDER — FENOFIBRIC ACID 135 MG PO CPDR: 1.0000 | DELAYED_RELEASE_CAPSULE | Freq: Every day | ORAL | 1 refills | Status: DC

## 2020-09-10 NOTE — Telephone Encounter (Signed)
Rx(s) sent to pharmacy electronically.  

## 2020-09-16 ENCOUNTER — Other Ambulatory Visit: Payer: Self-pay | Admitting: Nurse Practitioner

## 2020-09-21 ENCOUNTER — Other Ambulatory Visit: Payer: Self-pay | Admitting: Nurse Practitioner

## 2020-09-23 ENCOUNTER — Ambulatory Visit: Payer: Medicare Other | Admitting: Orthopedic Surgery

## 2020-09-23 ENCOUNTER — Encounter: Payer: Self-pay | Admitting: Orthopedic Surgery

## 2020-09-23 ENCOUNTER — Other Ambulatory Visit: Payer: Self-pay

## 2020-09-23 VITALS — BP 142/68 | HR 95 | Temp 97.7°F | Resp 18 | Ht 72.0 in | Wt 209.8 lb

## 2020-09-23 DIAGNOSIS — I82622 Acute embolism and thrombosis of deep veins of left upper extremity: Secondary | ICD-10-CM

## 2020-09-23 DIAGNOSIS — R600 Localized edema: Secondary | ICD-10-CM

## 2020-09-23 DIAGNOSIS — M7989 Other specified soft tissue disorders: Secondary | ICD-10-CM | POA: Diagnosis not present

## 2020-09-23 DIAGNOSIS — L853 Xerosis cutis: Secondary | ICD-10-CM

## 2020-09-23 DIAGNOSIS — R238 Other skin changes: Secondary | ICD-10-CM

## 2020-09-23 MED ORDER — HYDROCORTISONE 1 % EX OINT: 1.0000 "application " | TOPICAL_OINTMENT | Freq: Two times a day (BID) | CUTANEOUS | 0 refills | Status: DC

## 2020-09-23 NOTE — Progress Notes (Addendum)
Careteam: Patient Care Team: Yvonna Alanis, NP as PCP - General (Adult Health Nurse Practitioner) Belva Crome, MD as PCP - Cardiology (Cardiology) [name], MD as PCP - Electrophysiology (Cardiology) Roel Cluck, MD as Referring Physician (Ophthalmology) Harriett Sine, MD as Consulting Physician (Dermatology)  Seen by: [name], AGNP-C  PLACE OF SERVICE:  [place] Directive information    Allergies  Allergen Reactions   Sulfa Antibiotics Hives    Chief Complaint  Patient presents with   Acute Visit    Patient complains of swelling of the feet.      HPI: Patient is a 80 y.o. male seen today for acute visit due to increased ankle swelling and left upper arm swelling  Bilateral ankle swelling began a few days ago. He takes 1/2 tablet (20 mg) of lasix daily. Denies chest pain or sob. Denies sedentary lifestyle or high sodium diet.   Left upper arm swelling-recalls injuring his left elbow about one week prior, small scab noted. 2 days ago he noticed his left forearm started to become warm and swollen. Denies pain to left extremity or fever. He is compliant with taking Eliquis 5 mg bid for atrial fibrillation. In addition, left upper extremity itches. He has been taking daily zyrtec and benadryl. Asking about topical to help with itch.     Review of Systems:  Review of Systems  Constitutional:  Negative for chills, fever, malaise/fatigue and weight loss.  Respiratory:  Negative for cough and shortness of breath.   Cardiovascular:  Positive for leg swelling. Negative for chest pain.  Skin:  Positive for itching.  Psychiatric/Behavioral:  Negative for substance abuse. The patient is not nervous/anxious.    Past Medical History:  Diagnosis Date   AICD (automatic cardioverter/defibrillator) present    Aneurysm ([place])    a. Aneurysmal infrarenal aorta up to 33 mm on CT 10/2014, recommended f/u due 10/2017   Anginal pain ([place])    Anxiety     Basal cell carcinoma of nose    S/P MOHS   Biliary acute pancreatitis    CAD (coronary artery disease)    a. s/p MI in 1994 with PCI to LAD at that time b. cath 10/2012 demonstrated EF 30%, inferior akinesis with mild hypokinesis of all walls, patent LAD and RCA stents; ostial PDA with 80-90% obstruction with medical therapy recommended    Chronic systolic CHF (congestive heart failure) (HCC)    EF 30 to 35 % as of 09/2014.    CKD (chronic kidney disease), stage III ([place])    Complication of anesthesia 10/2014   "had to have defibrillator w/ERCP"   COPD (chronic obstructive pulmonary disease) ([place])    a. followed by pulmonary, COPD GOLD stage II   Depression    Diverticulosis of colon 07/2014   noted on CT   GERD (gastroesophageal reflux disease)    Hiatal hernia    Hyperglycemia 10/2012.   Hyperlipidemia    Hypertension    Myocardial infarction [facility] 1994; 2011   Pneumonia 1946; 2015   Prostate enlargement 07/2014   observed on CT   Tobacco abuse    Ventricular tachycardia ([place])    a. 08/2009 s/p BSX E110 Teligen 100 AICD, ser#: [id number];  b. 08/2008 VT req ATP - detection reprogrammed from 160 to 150. c. EPS and VT ablation by Dr. Lovena Le 12/21/2014   Past Surgical History:  Procedure Laterality Date   BIOPSY  12/21/2017   Procedure: BIOPSY;  Surgeon: Henrene Pastor,  Docia Chuck, MD;  Location: [facility];  Service: Endoscopy;;   CATARACT EXTRACTION W/ INTRAOCULAR LENS  IMPLANT, BILATERAL Bilateral ~ 2011   COLONOSCOPY     COLONOSCOPY WITH PROPOFOL N/A 12/21/2017   Procedure: COLONOSCOPY WITH PROPOFOL;  Surgeon: [name], MD;  Location: [facility];  Service: Endoscopy;  Laterality: N/A;   ELECTROPHYSIOLOGIC STUDY N/A 12/21/2014   Procedure: V Tach Ablation;  Surgeon: [name], MD;  Location: [place] [facility];  Service: Cardiovascular;  Laterality: N/A;   ERCP N/A 11/16/2014   Procedure: ENDOSCOPIC RETROGRADE CHOLANGIOPANCREATOGRAPHY (ERCP);  Surgeon: [name], MD;  Location: [facility];  Service: Endoscopy;  Laterality: N/A;   ESOPHAGOGASTRODUODENOSCOPY (EGD) WITH PROPOFOL N/A 12/21/2017   Procedure: ESOPHAGOGASTRODUODENOSCOPY (EGD) WITH PROPOFOL;  Surgeon: [name], MD;  Location: [facility];  Service: Endoscopy;  Laterality: N/A;   EYE SURGERY     FOOT SURGERY Left 2005   "fixed bone that stuck out in my ankle area"   HEMORRHOID BANDING     IMPLANTABLE CARDIOVERTER DEFIBRILLATOR IMPLANT  09/06/09   BSX dual chamber ICD implanted in [place] for cardiac arrest 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   [place] Right ~ [place] N/A 11/25/2012   demonstrated EF 30%, inferior akinesis with mild hypokinesis of all walls, patent LAD and RCA stents; ostial PDA with 80-90% obstruction with medical therapy recommended   MOHS SURGERY  2008   nose, skin graft   POLYPECTOMY  12/21/2017   Procedure: POLYPECTOMY;  Surgeon: [name], MD;  [facility]: [facility];  Service: Endoscopy;;   RETINAL DETACHMENT SURGERY Right 2013   TENOLYSIS Right 12/21/2013   Procedure: TENOLYSIS FLEXOR CARPI RADIALIS ,DEBRIDEMENT RIGHT JOINT WRIST,DEBRIDEMENT SCAPHOTRAPEZIAL TRAPEZOID, REPAIR OF EXTENSOR HOOD;  Surgeon: Daryll Brod, MD;  Location: [place];  Service: Orthopedics;  Laterality: Right;   TOE SURGERY Right 09/2019   3rd toe/hammer toe   V-TACH ABLATION  12/21/2014   VIDEO BRONCHOSCOPY Bilateral 01/09/2016   Procedure: VIDEO BRONCHOSCOPY WITHOUT FLUORO;  Surgeon: Juanito Doom, MD;  Location: [facility];  Service: Cardiopulmonary;  Laterality: Bilateral;   Social History:   reports that he has quit smoking. His smoking use included cigarettes. He has a 55.00 pack-year smoking history. He has never used smokeless tobacco. He reports current alcohol use. He reports that he does not use drugs.  Family History  Problem Relation Age of Onset   Heart attack Brother    CAD Father    Hypertension Father    CAD  Mother    Hypertension Mother    Hypertension Brother    Stroke Neg Hx     Medications: Patient's Medications  New Prescriptions   No medications on file  Previous Medications   ACETAMINOPHEN (TYLENOL) 500 MG TABLET    Take 2 tablets by mouth daily.   ALBUTEROL (PROVENTIL) (2.5 MG/3ML) 0.083% NEBULIZER SOLUTION    USE 1 VIAL VIA NEBULIZER EVERY 6 HOURS AS NEEDED FOR WHEEZING OR SHORTNESS OF BREATH   AMIODARONE (PACERONE) 200 MG TABLET    TAKE 1 TABLET BY MOUTH EVERY DAY   ARTIFICIAL TEAR SOLUTION (SOOTHE XP OP)    Place 2 drops into both eyes daily as needed (dry eyes).   ATORVASTATIN (LIPITOR) 80 MG TABLET    TAKE 1 TABLET(80 MG) BY MOUTH DAILY   BENAZEPRIL (LOTENSIN) 10 MG TABLET    TAKE 1 TABLET BY MOUTH EVERY DAY   BUDESONIDE-FORMOTEROL (SYMBICORT)  160-4.5 MCG/ACT INHALER    Inhale 2 puffs into the lungs 2 (two) times daily.   BUPROPION (WELLBUTRIN) 75 MG TABLET    TAKE 1 TABLET BY MOUTH EVERY DAY   BUSPIRONE (BUSPAR) 15 MG TABLET    TAKE 1 TABLET BY MOUTH TWICE A DAY   CARVEDILOL (COREG) 3.125 MG TABLET    TAKE 1 TABLET BY MOUTH TWICE A DAY   CETIRIZINE (ZYRTEC) 10 MG TABLET    Take 1 tablet by mouth daily as needed.   CHOLINE FENOFIBRATE (FENOFIBRIC ACID) 135 MG CPDR    Take 1 tablet by mouth daily.   ELIQUIS 5 MG TABS TABLET    TAKE 1 TABLET BY MOUTH TWICE A DAY   ESCITALOPRAM (LEXAPRO) 20 MG TABLET    TAKE 1 TABLET BY MOUTH EVERY DAY   FINASTERIDE (PROSCAR) 5 MG TABLET    Take 5 mg by mouth daily.   FLUTICASONE (FLONASE) 50 MCG/ACT NASAL SPRAY    Place 2 sprays into both nostrils as needed.    FUROSEMIDE (LASIX) 40 MG TABLET    TAKE 1 TABLET(20 MG) BY MOUTH DAILY for 3 days, then resume 1/2 tablet daily   GUAIFENESIN (MUCINEX) 600 MG 12 HR TABLET    Take 600 mg by mouth 2 (two) times daily.   IPRATROPIUM-ALBUTEROL (COMBIVENT RESPIMAT) 20-100 MCG/ACT AERS RESPIMAT    Inhale 1 puff into the lungs every 6 (six) hours. Shortness of breath or wheezing   IRON PO    Take by mouth  daily.    MAGNESIUM-OXIDE 400 (241.3 MG) MG TABLET    TAKE 1 TABSULE BY MOUTH DAILY   MEXILETINE (MEXITIL) 200 MG CAPSULE    TAKE 1 CAPSULE BY MOUTH TWICE A DAY   MULTIPLE VITAMINS-MINERALS (CENTRUM ADULTS PO)    Take by mouth daily.   NITROGLYCERIN (NITROSTAT) 0.4 MG SL TABLET    DISSOLVE 1 TABLET UNDER THE TONGUE EVERY 5 MINUTES FOR 3 DOSES AS NEEDED FOR CHEST PAIN   OMEPRAZOLE (PRILOSEC) 20 MG CAPSULE    Take 20 mg by mouth daily.    POTASSIUM CHLORIDE SA (KLOR-CON M20) 20 MEQ TABLET    TAKE 1 TABLET BY MOUTH EVERY DAY   RESPIRATORY THERAPY SUPPLIES (FLUTTER) DEVI    Use as directed   SENNOSIDES-DOCUSATE SODIUM (STOOL SOFTENER/LAXATIVE PO)    Take 2 tablets by mouth daily.   SIMETHICONE (GAS-X) 80 MG CHEWABLE TABLET    Chew 1 tablet (80 mg total) by mouth every 6 (six) hours as needed for flatulence.   SPACER/AERO-HOLDING CHAMBERS (OPTICHAMBER DIAMOND) MISC    optichamber VHC   TAMSULOSIN (FLOMAX) 0.4 MG CAPS CAPSULE    TAKE 1 CAPSULE(0.4 MG) BY MOUTH DAILY AFTER SUPPER   TIOTROPIUM BROMIDE MONOHYDRATE (SPIRIVA RESPIMAT) 2.5 MCG/ACT AERS    Inhale 2 puffs into the lungs daily.   VITAMIN B-12 (CYANOCOBALAMIN) 1000 MCG TABLET    Take 1 tablet (1,000 mcg total) by mouth daily.  Modified Medications   No medications on file  Discontinued Medications   No medications on file    Physical Exam:  There were no vitals filed for this visit. There is no height or weight on file to calculate BMI. Wt Readings from Last 3 Encounters:  07/16/20 214 lb 6.4 oz (97.3 kg)  07/10/20 216 lb 9.6 oz (98.2 kg)  07/07/20 215 lb (97.5 kg)    Physical Exam Vitals reviewed.  Constitutional:      General: He is not in acute distress. Cardiovascular:  Rate and Rhythm: Normal rate. Rhythm irregular.     Pulses: Normal pulses.     Heart sounds: Normal heart sounds. No murmur heard. Pulmonary:     Effort: Pulmonary effort is normal. No respiratory distress.     Breath sounds: Normal breath sounds. No  wheezing.  Musculoskeletal:     Right lower leg: Edema present.     Left lower leg: Edema present.     Comments: +1 pitting edema over bilateral ankles and dorsal foot.   Left upper extremity warm to touch, swollen over forearm. Skin appears dry and flaking.   Skin:    General: Skin is warm and dry.     Findings: No rash.     Comments: Small pea sized scab to left elbow, CDI.   Neurological:     General: No focal deficit present.     Mental Status: He is alert and oriented to person, place, and time.  Psychiatric:        Mood and Affect: Mood normal.        Behavior: Behavior normal.    Labs reviewed: Basic Metabolic Panel: Recent Labs    03/01/20 1344 06/07/20 1434 07/07/20 0554  NA 139 142 136  K 4.4 4.4 3.7  CL 104 102 102  CO2 25 23 26   GLUCOSE 84 90 102*  BUN 11 11 21   CREATININE 1.08 1.08 1.11  CALCIUM 9.4 9.5 9.2  TSH  --  2.290  --    Liver Function Tests: Recent Labs    03/01/20 1344 06/07/20 1434  AST 34 61*  ALT 45 67*  ALKPHOS  --  54  BILITOT 0.6 0.8  PROT 6.4 6.6  ALBUMIN  --  4.3   No results for input(s): LIPASE, AMYLASE in the last 8760 hours. No results for input(s): AMMONIA in the last 8760 hours. CBC: Recent Labs    03/01/20 1344 06/07/20 1434 07/07/20 0554  WBC 5.8 5.1 4.2  NEUTROABS 3,921  --  2.4  HGB 13.2 13.0 11.9*  HCT 39.4 38.0 36.2*  MCV 99.2 98* 102.5*  PLT 185 177 167   Lipid Panel: Recent Labs    03/01/20 1344  CHOL 136  HDL 54  LDLCALC 67  TRIG 71  CHOLHDL 2.5   TSH: Recent Labs    06/07/20 1434  TSH 2.290   A1C: Lab Results  Component Value Date   HGBA1C 5.6 12/29/2016     Assessment/Plan 1. Edema of both lower extremities - +1 pitting edema bilateral ankles and dorsal foot - advised to increase lasix to 40 mg x 3 days- contact PCP if swelling persists - recommend low sodium diet - recommend TED hose daily - Recommend leg elevation  2. Redness and swelling of upper arm - LUE warm to touch  and swollen - takes Eliquis 5 mg bid for atrial fibrillation - reports small scab to left elbow from bumping arm> 1 week ago - [place] UPPER EXTREMITY DUPLEX LEFT (NON-WBI); Future  3. Dry skin - skin appears dry and flaking - cont zyrtec daily, limit benadryl use to prn - hydrocortisone 1 % ointment; Apply 1 application topically 2 (two) times daily.  Dispense: 30 g; Refill: 0  4. Acute deep vein thrombosis of brachial vein of left upper extremity - ultrasound confirmed small clot  - advised to stop current Eliquis dosage for one week - start Eliquis 10 mg po bid x 7 days - advised to report to ED if any chest pain or shortness  of breath  Total time: 31 minutes. Greater than 50% of total time spent doing patient education on symptoms management for itching skin and medication management regarding diuretics.    Next appt: [place] [phone]

## 2020-09-23 NOTE — Patient Instructions (Addendum)
Take 40 mg of lasix x 3 days to help with lower foot swelling.   Restart zyrtec daily to help with itching. Try not to use benadryl daily.   [facility]- [phone]

## 2020-09-24 ENCOUNTER — Telehealth: Payer: Self-pay | Admitting: *Deleted

## 2020-09-24 NOTE — Telephone Encounter (Signed)
If that is the soonest he can get in, I think that is fine. If he has new onset chest pain or sob, he needs to report to emergency room. If he changes his mind, he can always call [facility] for availability.

## 2020-09-24 NOTE — Telephone Encounter (Signed)
Patient called and stated that he was seen yesterday. Stated that he wants to go to the [facility] to have his Ultrasound done and they can get him in 09/26/2020. Patient wants to know if it is ok to wait till then.   Please Advise.

## 2020-09-24 NOTE — Telephone Encounter (Signed)
LMOM for patient to [facility].

## 2020-09-25 ENCOUNTER — Other Ambulatory Visit: Payer: Self-pay

## 2020-09-25 ENCOUNTER — Ambulatory Visit (INDEPENDENT_AMBULATORY_CARE_PROVIDER_SITE_OTHER): Payer: Medicare Other

## 2020-09-25 ENCOUNTER — Other Ambulatory Visit: Payer: Self-pay | Admitting: Orthopedic Surgery

## 2020-09-25 DIAGNOSIS — R238 Other skin changes: Secondary | ICD-10-CM

## 2020-09-25 DIAGNOSIS — M7989 Other specified soft tissue disorders: Secondary | ICD-10-CM | POA: Diagnosis not present

## 2020-09-25 DIAGNOSIS — I82622 Acute embolism and thrombosis of deep veins of left upper extremity: Secondary | ICD-10-CM

## 2020-09-25 MED ORDER — APIXABAN 5 MG PO TABS
10.0000 mg | ORAL_TABLET | Freq: Two times a day (BID) | ORAL | 0 refills | Status: DC
Start: 2020-09-25 — End: 2020-12-25

## 2020-09-25 NOTE — Telephone Encounter (Signed)
LMOM to return call.

## 2020-09-25 NOTE — Progress Notes (Signed)
Ultrasound discussed with patient. He understands to hold Eliquis 5 mg po bid for one week and start Eliquis 10 mg po bid x 7 days.

## 2020-09-26 ENCOUNTER — Other Ambulatory Visit (HOSPITAL_BASED_OUTPATIENT_CLINIC_OR_DEPARTMENT_OTHER): Payer: Medicare Other

## 2020-09-26 ENCOUNTER — Telehealth: Payer: Self-pay

## 2020-09-26 NOTE — Telephone Encounter (Signed)
Patient called just to inform you he wasn't able to get the Eliquis that was send into the pharmacy due prescription already refilled. Also his insurance company is going to send a prior authorization to be completed so patient can receive his medication.  Just a [place]

## 2020-09-26 NOTE — Telephone Encounter (Signed)
Fax received to complete PA for eliquis. Last OV note and form placed in Amy's review and sign folder

## 2020-09-26 NOTE — Telephone Encounter (Signed)
Discussed options with patient. At this time he plans to take 2 (5mg ) tablets of Eliquis bid x 7 days from his personal supply. I will complete prior authorization when delivered.

## 2020-10-01 ENCOUNTER — Telehealth: Payer: Self-pay

## 2020-10-01 NOTE — Telephone Encounter (Signed)
Thank you  for update

## 2020-10-01 NOTE — Telephone Encounter (Signed)
I would have tried xarelto next depending on insurance.

## 2020-10-01 NOTE — Telephone Encounter (Signed)
Thank you for update. Can we update patient to ensure is medication compliance?

## 2020-10-01 NOTE — Telephone Encounter (Signed)
Patient is aware of Amy's reply and express his gratitude for following up on his question.

## 2020-10-01 NOTE — Telephone Encounter (Signed)
Spoke with patient, patient states his wife also takes Eliquis and he is using some of her supply while taking this increased dose rx'ed by Amy, as his wife had more pills than needed. Patient will complete increased dose on Wednesday and resume his original dose of 5 mg twice daily.  As I reviewed medication it appears rx  was originally prescribed by his cardiologist.   Patient asked what would Amy's plan have been had he not had access to get more pills from his wife?

## 2020-10-01 NOTE — Telephone Encounter (Signed)
Patient is expecting me to call him back to let him know what you would've done had he not been able to use his wife's pills  He plans to contact his insurance plan to discuss how this was handled and is collecting data to present to them

## 2020-10-01 NOTE — Telephone Encounter (Signed)
Medication was denied which usually indicates provider needs to prescribe an alternative.  I called patient as instructed by the provider, left message on voicemail requesting return call

## 2020-10-01 NOTE — Telephone Encounter (Signed)
Incoming fax received from [facility] on 09/26/2020 @ 8:21 pm, with a notice of denial for medical coverage for Eliquis.   Eliquis was denied under the medicare advantage plan with the explanation that drug coverage provided by the patients plan is limited only to those drugs under your medical benefits. Patients medicare advantage plan does not cover outpatient prescription drugs.  (Full report is available under media tab)  (Evie/CMA was covering Clinical Intake on 09/27/2020)

## 2020-10-18 ENCOUNTER — Ambulatory Visit: Payer: Medicare Other | Attending: Internal Medicine

## 2020-10-18 DIAGNOSIS — Z20822 Contact with and (suspected) exposure to covid-19: Secondary | ICD-10-CM

## 2020-10-20 LAB — NOVEL CORONAVIRUS, NAA: SARS-CoV-2, NAA: NOT DETECTED

## 2020-10-20 LAB — SPECIMEN STATUS REPORT

## 2020-10-20 LAB — SARS-COV-2, NAA 2 DAY TAT

## 2020-11-08 ENCOUNTER — Ambulatory Visit: Payer: Medicare Other | Admitting: Orthopedic Surgery

## 2020-11-14 ENCOUNTER — Ambulatory Visit: Payer: Medicare Other | Admitting: Orthopedic Surgery

## 2020-11-14 ENCOUNTER — Other Ambulatory Visit: Payer: Self-pay

## 2020-11-14 ENCOUNTER — Encounter: Payer: Self-pay | Admitting: Orthopedic Surgery

## 2020-11-14 VITALS — BP 126/72 | HR 69 | Temp 97.1°F | Ht 72.0 in | Wt 204.6 lb

## 2020-11-14 DIAGNOSIS — R600 Localized edema: Secondary | ICD-10-CM

## 2020-11-14 DIAGNOSIS — G629 Polyneuropathy, unspecified: Secondary | ICD-10-CM

## 2020-11-14 DIAGNOSIS — J411 Mucopurulent chronic bronchitis: Secondary | ICD-10-CM

## 2020-11-14 DIAGNOSIS — I2510 Atherosclerotic heart disease of native coronary artery without angina pectoris: Secondary | ICD-10-CM | POA: Diagnosis not present

## 2020-11-14 DIAGNOSIS — D696 Thrombocytopenia, unspecified: Secondary | ICD-10-CM

## 2020-11-14 DIAGNOSIS — R748 Abnormal levels of other serum enzymes: Secondary | ICD-10-CM | POA: Diagnosis not present

## 2020-11-14 DIAGNOSIS — J449 Chronic obstructive pulmonary disease, unspecified: Secondary | ICD-10-CM

## 2020-11-14 DIAGNOSIS — I82622 Acute embolism and thrombosis of deep veins of left upper extremity: Secondary | ICD-10-CM

## 2020-11-14 MED ORDER — GABAPENTIN 100 MG PO CAPS: 100.0000 mg | ORAL_CAPSULE | Freq: Every day | ORAL | 3 refills | Status: DC

## 2020-11-14 NOTE — Patient Instructions (Addendum)
Prescription for gabapentin sent to pharmacy- start with one capsule and try for one week- may double dose.   Please get flu vaccine by October 31st 2022.

## 2020-11-14 NOTE — Progress Notes (Signed)
Careteam: Patient Care Team: Yvonna Alanis, NP as PCP - General (Adult Health Nurse Practitioner) Belva Crome, MD as PCP - Cardiology (Cardiology) [name], MD as PCP - Electrophysiology (Cardiology) Roel Cluck, MD as Referring Physician (Ophthalmology) Harriett Sine, MD as Consulting Physician (Dermatology)  Seen by: [name], AGNP-C  PLACE OF SERVICE:  [place] Directive information    Allergies  Allergen Reactions   Sulfa Antibiotics Hives    Chief Complaint  Patient presents with   Medical Management of Chronic Issues    Patient presents for a 6 month follow-up.   Acute Visit    Patient reports bilateral feet swelling for over a month now per pt.   Quality Metric Gaps    Per pt care gap in epic he is due for Zoster, Covid booster and flu vaccines.     HPI: Patient is a 80 y.o. male seen today for medical management of chronic conditions.   BLE- ankles swollen nightly. Takes furosemide 20 mg daily. Asking to increase furosemide prn. Denies high sodium diet. Reports being very active daily and does not sit for long period of time.   Peripheral neuropathy- reports increased pain to lower legs at night. He is having trouble sleeping. Pain described as constant burning, rated 4-5/10. He tried gabapentin for another ailement in the past, asking for try today.   COPD- followed by new pulmonary doctor, reports he has a PFT done this summer. He was given a sample of Treliogy and noticed his breathing improved. He stopped using spiriva and combivent nebulizer after medication change. Denies smoking at this time, he has refrained from cigarettes since December 2021.   Left brachial blood clot resolved, remains on eliquis. Discoloration to arms and legs. Purple in color. He reports it has worsened in last few months. He does "bump" into things often, but bruising is getting worse.  Moved to [place], [place] due to affordable rent.       Review of Systems:  Review of Systems  Constitutional:  Negative for chills, fever, malaise/fatigue and weight loss.  HENT:  Negative for hearing loss and sore throat.   Eyes: Negative.   Respiratory:  Positive for cough, sputum production, shortness of breath and wheezing.   Cardiovascular:  Positive for leg swelling. Negative for chest pain.  Gastrointestinal:  Negative for abdominal pain, blood in stool, constipation, diarrhea, heartburn, nausea and vomiting.  Genitourinary:  Negative for dysuria and hematuria.  Musculoskeletal:  Negative for falls, joint pain and myalgias.  Skin:        Discoloration to extremities  Neurological:  Positive for tingling. Negative for dizziness, weakness and headaches.  Psychiatric/Behavioral:  Negative for depression and memory loss. The patient is not nervous/anxious and does not have insomnia.    Past Medical History:  Diagnosis Date   AICD (automatic cardioverter/defibrillator) present    Aneurysm ([place])    a. Aneurysmal infrarenal aorta up to 33 mm on CT 10/2014, recommended f/u due 10/2017   Anginal pain ([place])    Anxiety    Basal cell carcinoma of nose    S/P MOHS   Biliary acute pancreatitis    CAD (coronary artery disease)    a. s/p MI in 1994 with PCI to LAD at that time b. cath 10/2012 demonstrated EF 30%, inferior akinesis with mild hypokinesis of all walls, patent LAD and RCA stents; ostial PDA with 80-90% obstruction with medical therapy recommended    Chronic systolic CHF (congestive  heart failure) (HCC)    EF 30 to 35 % as of 09/2014.    CKD (chronic kidney disease), stage III ([place])    Complication of anesthesia 10/2014   "had to have defibrillator w/ERCP"   COPD (chronic obstructive pulmonary disease) ([place])    a. followed by pulmonary, COPD GOLD stage II   Depression    Diverticulosis of colon 07/2014   noted on CT   GERD (gastroesophageal reflux disease)    Hiatal hernia    Hyperglycemia 10/2012.   Hyperlipidemia     Hypertension    Myocardial infarction [facility]) 1994; 2011   Pneumonia 1946; 2015   Prostate enlargement 07/2014   observed on CT   Tobacco abuse    Ventricular tachycardia ([place])    a. 08/2009 s/p BSX E110 Teligen 100 AICD, ser#: [id number];  b. 08/2008 VT req ATP - detection reprogrammed from 160 to 150. c. EPS and VT ablation by Dr. Lovena Le 12/21/2014   Past Surgical History:  Procedure Laterality Date   BIOPSY  12/21/2017   Procedure: BIOPSY;  Surgeon: [name], MD;  Location: [facility];  Service: Endoscopy;;   CATARACT EXTRACTION W/ INTRAOCULAR LENS  IMPLANT, BILATERAL Bilateral ~ 2011   COLONOSCOPY     COLONOSCOPY WITH PROPOFOL N/A 12/21/2017   Procedure: COLONOSCOPY WITH PROPOFOL;  Surgeon: [name], MD;  Location: [facility];  Service: Endoscopy;  Laterality: N/A;   ELECTROPHYSIOLOGIC STUDY N/A 12/21/2014   Procedure: V Tach Ablation;  Surgeon: [name], MD;  Location: [facility];  Service: Cardiovascular;  Laterality: N/A;   ERCP N/A 11/16/2014   Procedure: ENDOSCOPIC RETROGRADE CHOLANGIOPANCREATOGRAPHY (ERCP);  Surgeon: [name], MD;  Location: [facility];  Service: Endoscopy;  Laterality: N/A;   ESOPHAGOGASTRODUODENOSCOPY (EGD) WITH PROPOFOL N/A 12/21/2017   Procedure: ESOPHAGOGASTRODUODENOSCOPY (EGD) WITH PROPOFOL;  Surgeon: [name], MD;  [facility];  Service: Endoscopy;  Laterality: N/A;   EYE SURGERY     FOOT SURGERY Left 2005   "fixed bone that stuck out in my ankle area"   HEMORRHOID BANDING     IMPLANTABLE CARDIOVERTER DEFIBRILLATOR IMPLANT  09/06/09   BSX dual chamber ICD implanted in [place] for cardiac arrest 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   Porcupine Right ~ [place] N/A 11/25/2012   demonstrated EF 30%, inferior akinesis with mild hypokinesis of all walls, patent LAD and RCA stents; ostial PDA with 80-90% obstruction with medical therapy recommended   MOHS SURGERY  2008    nose, skin graft   POLYPECTOMY  12/21/2017   Procedure: POLYPECTOMY;  Surgeon: [name], MD;  [facility];  Service: Endoscopy;;   RETINAL DETACHMENT SURGERY Right 2013   TENOLYSIS Right 12/21/2013   Procedure: TENOLYSIS FLEXOR CARPI RADIALIS ,DEBRIDEMENT RIGHT JOINT WRIST,DEBRIDEMENT SCAPHOTRAPEZIAL TRAPEZOID, REPAIR OF EXTENSOR HOOD;  Surgeon: Daryll Brod, MD;  Location: [facility];  Service: Orthopedics;  Laterality: Right;   TOE SURGERY Right 09/2019   3rd toe/hammer toe   V-TACH ABLATION  12/21/2014   VIDEO BRONCHOSCOPY Bilateral 01/09/2016   Procedure: VIDEO BRONCHOSCOPY WITHOUT FLUORO;  Surgeon: Juanito Doom, MD;  [facility];  Service: Cardiopulmonary;  Laterality: Bilateral;   Social History:   reports that he has quit smoking. His smoking use included cigarettes. He has a 55.00 pack-year smoking history. He has never used smokeless tobacco. He reports current alcohol use. He reports that he does not use drugs.  Family History  Problem Relation Age of Onset   Heart attack Brother    CAD Father    Hypertension Father    CAD Mother    Hypertension Mother    Hypertension Brother    Stroke Neg Hx     Medications: Patient's Medications  New Prescriptions   No medications on file  Previous Medications   ACETAMINOPHEN (TYLENOL) 500 MG TABLET    Take 2 tablets by mouth daily.   ALBUTEROL (PROVENTIL) (2.5 MG/3ML) 0.083% NEBULIZER SOLUTION    USE 1 VIAL VIA NEBULIZER EVERY 6 HOURS AS NEEDED FOR WHEEZING OR SHORTNESS OF BREATH   AMIODARONE (PACERONE) 200 MG TABLET    TAKE 1 TABLET BY MOUTH EVERY DAY   APIXABAN (ELIQUIS) 5 MG TABS TABLET    Take 2 tablets (10 mg total) by mouth 2 (two) times daily for 7 days.   ARTIFICIAL TEAR SOLUTION (SOOTHE XP OP)    Place 2 drops into both eyes daily as needed (dry eyes).   ATORVASTATIN (LIPITOR) 80 MG TABLET    TAKE 1 TABLET(80 MG) BY MOUTH DAILY   BENAZEPRIL (LOTENSIN) 10 MG TABLET    TAKE 1 TABLET BY  MOUTH EVERY DAY   BUDESONIDE-FORMOTEROL (SYMBICORT) 160-4.5 MCG/ACT INHALER    Inhale 2 puffs into the lungs 2 (two) times daily.   BUPROPION (WELLBUTRIN) 75 MG TABLET    TAKE 1 TABLET BY MOUTH EVERY DAY   BUSPIRONE (BUSPAR) 15 MG TABLET    TAKE 1 TABLET BY MOUTH TWICE A DAY   CARVEDILOL (COREG) 3.125 MG TABLET    TAKE 1 TABLET BY MOUTH TWICE A DAY   CETIRIZINE (ZYRTEC) 10 MG TABLET    Take 1 tablet by mouth daily as needed.   CHOLINE FENOFIBRATE (FENOFIBRIC ACID) 135 MG CPDR    Take 1 tablet by mouth daily.   ELIQUIS 5 MG TABS TABLET    TAKE 1 TABLET BY MOUTH TWICE A DAY   ESCITALOPRAM (LEXAPRO) 20 MG TABLET    TAKE 1 TABLET BY MOUTH EVERY DAY   FINASTERIDE (PROSCAR) 5 MG TABLET    Take 5 mg by mouth daily.   FLUTICASONE (FLONASE) 50 MCG/ACT NASAL SPRAY    Place 2 sprays into both nostrils as needed.    FUROSEMIDE (LASIX) 40 MG TABLET    TAKE 1 TABLET(20 MG) BY MOUTH DAILY for 3 days, then resume 1/2 tablet daily   GUAIFENESIN (MUCINEX) 600 MG 12 HR TABLET    Take 600 mg by mouth 2 (two) times daily.   HYDROCORTISONE 1 % OINTMENT    Apply 1 application topically 2 (two) times daily.   IPRATROPIUM-ALBUTEROL (COMBIVENT RESPIMAT) 20-100 MCG/ACT AERS RESPIMAT    Inhale 1 puff into the lungs every 6 (six) hours. Shortness of breath or wheezing   IRON PO    Take by mouth daily.    MAGNESIUM-OXIDE 400 (241.3 MG) MG TABLET    TAKE 1 TABSULE BY MOUTH DAILY   MEXILETINE (MEXITIL) 200 MG CAPSULE    TAKE 1 CAPSULE BY MOUTH TWICE A DAY   MULTIPLE VITAMINS-MINERALS (CENTRUM ADULTS PO)    Take by mouth daily.   NITROGLYCERIN (NITROSTAT) 0.4 MG SL TABLET    DISSOLVE 1 TABLET UNDER THE TONGUE EVERY 5 MINUTES FOR 3 DOSES AS NEEDED FOR CHEST PAIN   OMEPRAZOLE (PRILOSEC) 20 MG CAPSULE    Take 20 mg by mouth daily.    POTASSIUM CHLORIDE SA (KLOR-CON M20) 20 MEQ TABLET    TAKE 1 TABLET BY  MOUTH EVERY DAY   RESPIRATORY THERAPY SUPPLIES (FLUTTER) DEVI    Use as directed   SENNOSIDES-DOCUSATE SODIUM (STOOL  SOFTENER/LAXATIVE PO)    Take 2 tablets by mouth daily.   SIMETHICONE (GAS-X) 80 MG CHEWABLE TABLET    Chew 1 tablet (80 mg total) by mouth every 6 (six) hours as needed for flatulence.   SPACER/AERO-HOLDING CHAMBERS (OPTICHAMBER DIAMOND) MISC    optichamber VHC   TAMSULOSIN (FLOMAX) 0.4 MG CAPS CAPSULE    TAKE 1 CAPSULE(0.4 MG) BY MOUTH DAILY AFTER SUPPER   TIOTROPIUM BROMIDE MONOHYDRATE (SPIRIVA RESPIMAT) 2.5 MCG/ACT AERS    Inhale 2 puffs into the lungs daily.   VITAMIN B-12 (CYANOCOBALAMIN) 1000 MCG TABLET    Take 1 tablet (1,000 mcg total) by mouth daily.  Modified Medications   No medications on file  Discontinued Medications   No medications on file    Physical Exam:  Vitals:   11/14/20 1416  BP: 126/72  Pulse: 69  Temp: (!) 97.1 F (36.2 C)  SpO2: 96%  Weight: 204 lb 9.6 oz (92.8 kg)  Height: 6' (1.829 m)   Body mass index is 27.75 kg/m. Wt Readings from Last 3 Encounters:  11/14/20 204 lb 9.6 oz (92.8 kg)  09/23/20 209 lb 12.8 oz (95.2 kg)  07/16/20 214 lb 6.4 oz (97.3 kg)    Physical Exam Vitals reviewed.  Constitutional:      General: He is not in acute distress. HENT:     Head: Normocephalic.     Right Ear: There is no impacted cerumen.     Left Ear: There is no impacted cerumen.     Nose: Nose normal.     Mouth/Throat:     Mouth: Mucous membranes are moist.  Eyes:     General:        Right eye: No discharge.        Left eye: No discharge.  Neck:     Vascular: No carotid bruit.  Cardiovascular:     Rate and Rhythm: Normal rate. Rhythm irregular.     Pulses: Normal pulses.     Heart sounds: Normal heart sounds. No murmur heard. Pulmonary:     Effort: Pulmonary effort is normal. No respiratory distress.     Breath sounds: Normal breath sounds. No wheezing.  Abdominal:     General: Bowel sounds are normal. There is no distension.     Palpations: Abdomen is soft.     Tenderness: There is no abdominal tenderness.  Musculoskeletal:     Cervical back:  Normal range of motion.     Right lower leg: Edema present.     Left lower leg: Edema present.     Comments: 1+ pitting  Lymphadenopathy:     Cervical: No cervical adenopathy.  Skin:    General: Skin is warm and dry.     Capillary Refill: Capillary refill takes less than 2 seconds.  Neurological:     General: No focal deficit present.     Mental Status: He is alert and oriented to person, place, and time.     Motor: Weakness present.     Gait: Gait abnormal.  Psychiatric:        Mood and Affect: Mood normal.        Behavior: Behavior normal.    Labs reviewed: Basic Metabolic Panel: Recent Labs    03/01/20 1344 06/07/20 1434 07/07/20 0554  NA 139 142 136  K 4.4 4.4 3.7  CL 104 102 102  CO2  $'25 23 26  'p$ GLUCOSE 84 90 102*  BUN '11 11 21  '$ CREATININE 1.08 1.08 1.11  CALCIUM 9.4 9.5 9.2  TSH  --  2.290  --    Liver Function Tests: Recent Labs    03/01/20 1344 06/07/20 1434  AST 34 61*  ALT 45 67*  ALKPHOS  --  54  BILITOT 0.6 0.8  PROT 6.4 6.6  ALBUMIN  --  4.3   No results for input(s): LIPASE, AMYLASE in the last 8760 hours. No results for input(s): AMMONIA in the last 8760 hours. CBC: Recent Labs    03/01/20 1344 06/07/20 1434 07/07/20 0554  WBC 5.8 5.1 4.2  NEUTROABS 3,921  --  2.4  HGB 13.2 13.0 11.9*  HCT 39.4 38.0 36.2*  MCV 99.2 98* 102.5*  PLT 185 177 167   Lipid Panel: Recent Labs    03/01/20 1344  CHOL 136  HDL 54  LDLCALC 67  TRIG 71  CHOLHDL 2.5   TSH: Recent Labs    06/07/20 1434  TSH 2.290   A1C: Lab Results  Component Value Date   HGBA1C 5.6 12/29/2016     Assessment/Plan 1. Peripheral polyneuropathy - increased burning sensation at night - gabapentin (NEURONTIN) 100 MG capsule; Take 1 capsule (100 mg total) by mouth at bedtime.  Dispense: 90 capsule; Refill: 3  2. Coronary artery disease involving native coronary artery of native heart without angina pectoris - denies any recent chest pain - remains on statin - CBC  with Differential/Platelet - CMP  3. COPD mixed type ([place]) - CT chest progressive emphysema - recently seen by new pulmonary specialist- PFT done - started Treliogy- reports breathing improved - stopped Spiriva and combivent nebs  - CT chest lung cancer screening yearly - cont smoking cessation- quit December 2021  4. Elevated liver enzymes - AST/ALT 46/51 11/14/2020, was 61/67 07/07/2020 - recommend ultrasound of liver  - may need GI referral  5. Acute deep vein thrombosis (DVT) of brachial vein of left upper extremity (HCC) - resolved  6. Edema of both lower extremities - BLE 1+ edema - suggest furosemide 20 mg po daily prn for increased swelling - cont furosemide 20 mg po daily  7. Mucopurulent chronic bronchitis (HCC) - cont mucinex 600 mg po bid to thin secretions  8. Thrombocytopenia ([place]) - platelet level continues to trend down - platelets 119 11/14/2020 - repeat cbc/diff in 1 month - may need to stop DOAC if continues to trend down   Total time: 35 minutes. Greater than 50 % of total time spent doing patient education on health maintenance and symptom management.   Next appt: 03/06/2021   [name], [facility] [phone]

## 2020-11-15 ENCOUNTER — Other Ambulatory Visit: Payer: Self-pay | Admitting: Orthopedic Surgery

## 2020-11-15 DIAGNOSIS — D696 Thrombocytopenia, unspecified: Secondary | ICD-10-CM

## 2020-11-15 DIAGNOSIS — R748 Abnormal levels of other serum enzymes: Secondary | ICD-10-CM

## 2020-11-15 LAB — CBC WITH DIFFERENTIAL/PLATELET
Absolute Monocytes: 495 cells/uL (ref 200–950)
Basophils Absolute: 41 cells/uL (ref 0–200)
Basophils Relative: 0.9 %
Eosinophils Absolute: 72 cells/uL (ref 15–500)
Eosinophils Relative: 1.6 %
HCT: 38.1 % — ABNORMAL LOW (ref 38.5–50.0)
Hemoglobin: 12.9 g/dL — ABNORMAL LOW (ref 13.2–17.1)
Lymphs Abs: 986 cells/uL (ref 850–3900)
MCH: 34 pg — ABNORMAL HIGH (ref 27.0–33.0)
MCHC: 33.9 g/dL (ref 32.0–36.0)
MCV: 100.5 fL — ABNORMAL HIGH (ref 80.0–100.0)
MPV: 10.4 fL (ref 7.5–12.5)
Monocytes Relative: 11 %
Neutro Abs: 2907 cells/uL (ref 1500–7800)
Neutrophils Relative %: 64.6 %
Platelets: 119 10*3/uL — ABNORMAL LOW (ref 140–400)
RBC: 3.79 10*6/uL — ABNORMAL LOW (ref 4.20–5.80)
RDW: 14.1 % (ref 11.0–15.0)
Total Lymphocyte: 21.9 %
WBC: 4.5 10*3/uL (ref 3.8–10.8)

## 2020-11-15 LAB — COMPREHENSIVE METABOLIC PANEL
AG Ratio: 1.8 (calc) (ref 1.0–2.5)
ALT: 51 U/L — ABNORMAL HIGH (ref 9–46)
AST: 46 U/L — ABNORMAL HIGH (ref 10–35)
Albumin: 4.1 g/dL (ref 3.6–5.1)
Alkaline phosphatase (APISO): 43 U/L (ref 35–144)
BUN: 13 mg/dL (ref 7–25)
CO2: 28 mmol/L (ref 20–32)
Calcium: 10 mg/dL (ref 8.6–10.3)
Chloride: 103 mmol/L (ref 98–110)
Creat: 1.04 mg/dL (ref 0.70–1.22)
Globulin: 2.3 g/dL (calc) (ref 1.9–3.7)
Glucose, Bld: 83 mg/dL (ref 65–139)
Potassium: 4.9 mmol/L (ref 3.5–5.3)
Sodium: 138 mmol/L (ref 135–146)
Total Bilirubin: 0.8 mg/dL (ref 0.2–1.2)
Total Protein: 6.4 g/dL (ref 6.1–8.1)

## 2020-11-18 ENCOUNTER — Ambulatory Visit (INDEPENDENT_AMBULATORY_CARE_PROVIDER_SITE_OTHER): Payer: Medicare Other

## 2020-11-18 DIAGNOSIS — I255 Ischemic cardiomyopathy: Secondary | ICD-10-CM

## 2020-11-19 ENCOUNTER — Telehealth: Payer: Self-pay

## 2020-11-19 NOTE — Telephone Encounter (Signed)
The patient was trying to send transmission. The transmission was unsuccessful. I called tech support to get additional

## 2020-11-20 LAB — CUP PACEART REMOTE DEVICE CHECK
Battery Remaining Longevity: 18 mo
Battery Remaining Longevity: 18 mo
Battery Remaining Percentage: 18 %
Battery Remaining Percentage: 23 %
Brady Statistic RA Percent Paced: 1 %
Brady Statistic RA Percent Paced: 2 %
Brady Statistic RV Percent Paced: 0 %
Brady Statistic RV Percent Paced: 0 %
Date Time Interrogation Session: 20220525183600
Date Time Interrogation Session: 20220823103300
HighPow Impedance: 53 Ohm
HighPow Impedance: 57 Ohm
Implantable Lead Implant Date: 20110610
Implantable Lead Implant Date: 20110610
Implantable Lead Implant Date: 20110610
Implantable Lead Implant Date: 20110610
Implantable Lead Location: 753859
Implantable Lead Location: 753859
Implantable Lead Location: 753860
Implantable Lead Location: 753860
Implantable Lead Model: 185
Implantable Lead Model: 185
Implantable Lead Model: 4135
Implantable Lead Model: 4135
Implantable Lead Serial Number: 28681386
Implantable Lead Serial Number: 28681386
Implantable Lead Serial Number: 339643
Implantable Lead Serial Number: 339643
Implantable Pulse Generator Implant Date: 20110610
Implantable Pulse Generator Implant Date: 20110610
Lead Channel Impedance Value: 449 Ohm
Lead Channel Impedance Value: 502 Ohm
Lead Channel Impedance Value: 587 Ohm
Lead Channel Impedance Value: 604 Ohm
Lead Channel Pacing Threshold Amplitude: 0.9 V
Lead Channel Pacing Threshold Amplitude: 0.9 V
Lead Channel Pacing Threshold Amplitude: 1.1 V
Lead Channel Pacing Threshold Amplitude: 1.1 V
Lead Channel Pacing Threshold Pulse Width: 0.4 ms
Lead Channel Pacing Threshold Pulse Width: 0.4 ms
Lead Channel Pacing Threshold Pulse Width: 0.4 ms
Lead Channel Pacing Threshold Pulse Width: 0.4 ms
Lead Channel Setting Pacing Amplitude: 2 V
Lead Channel Setting Pacing Amplitude: 2 V
Lead Channel Setting Pacing Amplitude: 2.2 V
Lead Channel Setting Pacing Amplitude: 2.2 V
Lead Channel Setting Pacing Pulse Width: 0.4 ms
Lead Channel Setting Pacing Pulse Width: 0.4 ms
Lead Channel Setting Sensing Sensitivity: 0.6 mV
Lead Channel Setting Sensing Sensitivity: 0.6 mV
Pulse Gen Serial Number: 164892
Pulse Gen Serial Number: 164892

## 2020-11-21 ENCOUNTER — Other Ambulatory Visit: Payer: Self-pay

## 2020-11-21 ENCOUNTER — Ambulatory Visit (INDEPENDENT_AMBULATORY_CARE_PROVIDER_SITE_OTHER): Payer: Medicare Other

## 2020-11-21 DIAGNOSIS — R7989 Other specified abnormal findings of blood chemistry: Secondary | ICD-10-CM | POA: Diagnosis not present

## 2020-11-21 DIAGNOSIS — R748 Abnormal levels of other serum enzymes: Secondary | ICD-10-CM

## 2020-11-22 ENCOUNTER — Encounter: Payer: Self-pay | Admitting: Nurse Practitioner

## 2020-11-22 ENCOUNTER — Other Ambulatory Visit: Payer: Self-pay | Admitting: Orthopedic Surgery

## 2020-11-22 ENCOUNTER — Encounter: Payer: Self-pay | Admitting: Orthopedic Surgery

## 2020-11-22 DIAGNOSIS — R748 Abnormal levels of other serum enzymes: Secondary | ICD-10-CM

## 2020-11-22 DIAGNOSIS — I480 Paroxysmal atrial fibrillation: Secondary | ICD-10-CM

## 2020-11-22 DIAGNOSIS — I25810 Atherosclerosis of coronary artery bypass graft(s) without angina pectoris: Secondary | ICD-10-CM

## 2020-11-22 DIAGNOSIS — I5022 Chronic systolic (congestive) heart failure: Secondary | ICD-10-CM

## 2020-11-22 DIAGNOSIS — I472 Ventricular tachycardia, unspecified: Secondary | ICD-10-CM

## 2020-11-22 DIAGNOSIS — J449 Chronic obstructive pulmonary disease, unspecified: Secondary | ICD-10-CM

## 2020-11-22 MED ORDER — NITROGLYCERIN 0.4 MG SL SUBL: SUBLINGUAL_TABLET | SUBLINGUAL | 6 refills | Status: DC

## 2020-11-22 MED ORDER — AMIODARONE HCL 200 MG PO TABS
200.0000 mg | ORAL_TABLET | Freq: Every day | ORAL | 6 refills | Status: DC
Start: 2020-11-22 — End: 2021-07-22

## 2020-11-22 MED ORDER — FUROSEMIDE 40 MG PO TABS: ORAL_TABLET | ORAL | 6 refills | Status: DC

## 2020-11-22 MED ORDER — CARVEDILOL 3.125 MG PO TABS: 3.1250 mg | ORAL_TABLET | Freq: Two times a day (BID) | ORAL | 6 refills | Status: DC

## 2020-11-22 MED ORDER — ATORVASTATIN CALCIUM 80 MG PO TABS: ORAL_TABLET | ORAL | 6 refills | Status: DC

## 2020-11-22 MED ORDER — POTASSIUM CHLORIDE CRYS ER 20 MEQ PO TBCR: EXTENDED_RELEASE_TABLET | ORAL | 1 refills | Status: DC

## 2020-11-22 MED ORDER — BENAZEPRIL HCL 10 MG PO TABS: 10.0000 mg | ORAL_TABLET | Freq: Every day | ORAL | 6 refills | Status: DC

## 2020-11-22 MED ORDER — MEXILETINE HCL 200 MG PO CAPS: 200.0000 mg | ORAL_CAPSULE | Freq: Two times a day (BID) | ORAL | 3 refills | Status: DC

## 2020-11-22 NOTE — Progress Notes (Signed)
Attempted to discuss ultrasound results to patient, no answer. Referral to GI made.

## 2020-11-22 NOTE — Progress Notes (Signed)
Referral

## 2020-11-24 ENCOUNTER — Other Ambulatory Visit: Payer: Self-pay | Admitting: Interventional Cardiology

## 2020-11-26 NOTE — Progress Notes (Signed)
New referrals made per patient request.

## 2020-11-29 ENCOUNTER — Other Ambulatory Visit: Payer: Self-pay | Admitting: *Deleted

## 2020-11-29 MED ORDER — BUPROPION HCL 75 MG PO TABS: 75.0000 mg | ORAL_TABLET | Freq: Every day | ORAL | 2 refills | Status: DC

## 2020-11-29 NOTE — Telephone Encounter (Signed)
Patient requested refill

## 2020-12-04 NOTE — Progress Notes (Signed)
Remote ICD transmission.   

## 2020-12-16 ENCOUNTER — Other Ambulatory Visit: Payer: Medicare Other

## 2020-12-16 DIAGNOSIS — D696 Thrombocytopenia, unspecified: Secondary | ICD-10-CM

## 2020-12-20 ENCOUNTER — Other Ambulatory Visit: Payer: Medicare Other

## 2020-12-20 ENCOUNTER — Other Ambulatory Visit: Payer: Self-pay

## 2020-12-20 MED ORDER — APIXABAN 5 MG PO TABS: 5.0000 mg | ORAL_TABLET | Freq: Two times a day (BID) | ORAL | 6 refills | Status: DC

## 2020-12-20 NOTE — Telephone Encounter (Signed)
Prescription refill request for Eliquis received. Indication: Afib  Last office visit: 07/10/20 Tamala Julian)  Scr: 1.04 (11/14/20) Age: 80 Weight: 92.8kg  Appropriate dose and refill sent to requested pharmacy.

## 2020-12-21 LAB — CBC WITH DIFFERENTIAL/PLATELET
Absolute Monocytes: 363 cells/uL (ref 200–950)
Basophils Absolute: 30 cells/uL (ref 0–200)
Basophils Relative: 0.9 %
Eosinophils Absolute: 92 cells/uL (ref 15–500)
Eosinophils Relative: 2.8 %
HCT: 36.9 % — ABNORMAL LOW (ref 38.5–50.0)
Hemoglobin: 12 g/dL — ABNORMAL LOW (ref 13.2–17.1)
Lymphs Abs: 759 cells/uL — ABNORMAL LOW (ref 850–3900)
MCH: 33.2 pg — ABNORMAL HIGH (ref 27.0–33.0)
MCHC: 32.5 g/dL (ref 32.0–36.0)
MCV: 102.2 fL — ABNORMAL HIGH (ref 80.0–100.0)
MPV: 10.8 fL (ref 7.5–12.5)
Monocytes Relative: 11 %
Neutro Abs: 2056 cells/uL (ref 1500–7800)
Neutrophils Relative %: 62.3 %
Platelets: 158 10*3/uL (ref 140–400)
RBC: 3.61 10*6/uL — ABNORMAL LOW (ref 4.20–5.80)
RDW: 14.4 % (ref 11.0–15.0)
Total Lymphocyte: 23 %
WBC: 3.3 10*3/uL — ABNORMAL LOW (ref 3.8–10.8)

## 2020-12-23 ENCOUNTER — Encounter: Payer: Self-pay | Admitting: Orthopedic Surgery

## 2020-12-23 ENCOUNTER — Ambulatory Visit: Payer: Medicare Other | Admitting: Orthopedic Surgery

## 2020-12-23 ENCOUNTER — Other Ambulatory Visit: Payer: Self-pay

## 2020-12-23 VITALS — BP 100/73 | HR 59 | Temp 98.0°F | Ht 72.0 in | Wt 207.4 lb

## 2020-12-23 DIAGNOSIS — L03115 Cellulitis of right lower limb: Secondary | ICD-10-CM | POA: Diagnosis not present

## 2020-12-23 DIAGNOSIS — I5022 Chronic systolic (congestive) heart failure: Secondary | ICD-10-CM | POA: Diagnosis not present

## 2020-12-23 DIAGNOSIS — G629 Polyneuropathy, unspecified: Secondary | ICD-10-CM | POA: Diagnosis not present

## 2020-12-23 MED ORDER — DOXYCYCLINE HYCLATE 100 MG PO TABS: 100.0000 mg | ORAL_TABLET | Freq: Two times a day (BID) | ORAL | 0 refills | Status: DC

## 2020-12-23 MED ORDER — GABAPENTIN 300 MG PO CAPS: 300.0000 mg | ORAL_CAPSULE | Freq: Three times a day (TID) | ORAL | 1 refills | Status: DC

## 2020-12-23 NOTE — Progress Notes (Signed)
Careteam: Patient Care Team: Yvonna Alanis, NP as PCP - General (Adult Health Nurse Practitioner) Belva Crome, MD as PCP - Cardiology (Cardiology) [name], MD as PCP - Electrophysiology (Cardiology) Roel Cluck, MD as Referring Physician (Ophthalmology) Harriett Sine, MD as Consulting Physician (Dermatology)  Seen by: [name], AGNP-C  PLACE OF SERVICE:  [place] Directive information    Allergies  Allergen Reactions   Sulfa Antibiotics Hives    No chief complaint on file.    HPI: Patient is a 80 y.o. male seen today for acute visit due to lower leg redness and swelling.   Labs reviewed with patient.   He has d intermittent bilateral ankle swelling for about 2 weeks. He has been taking lasix 40 mg daily to help with swelling. In addition, he bumped into something and developed two skin tears to left upper shin around the same time he noticed swelling. He has been cleaning wounds with soap and water prn. Reports his wounds heal slowly. This weekend he became worried ankle swelling has not subsided and wound healing has not progressed. He denies chest pain, sob, fever, chills.   He continues to have burning feet at night. He initially started taking gabapentin 100 mg qhs, but has slowly increased dose to 300 mg qhs. Denies drowsiness or recent falls. Requesting new prescription for gabapentin.      Review of Systems:  Review of Systems  Constitutional:  Negative for chills, fever, malaise/fatigue and weight loss.  Respiratory:  Positive for wheezing. Negative for cough and shortness of breath.   Cardiovascular:  Positive for leg swelling. Negative for chest pain.  Musculoskeletal:  Negative for falls.  Skin:        Skin tear  Neurological:  Positive for sensory change.  Psychiatric/Behavioral:  Negative for depression. The patient is not nervous/anxious.    Past Medical History:  Diagnosis Date   AICD (automatic  cardioverter/defibrillator) present    Aneurysm ([place])    a. Aneurysmal infrarenal aorta up to 33 mm on CT 10/2014, recommended f/u due 10/2017   Anginal pain ([place])    Anxiety    Basal cell carcinoma of nose    S/P MOHS   Biliary acute pancreatitis    CAD (coronary artery disease)    a. s/p MI in 1994 with PCI to LAD at that time b. cath 10/2012 demonstrated EF 30%, inferior akinesis with mild hypokinesis of all walls, patent LAD and RCA stents; ostial PDA with 80-90% obstruction with medical therapy recommended    Chronic systolic CHF (congestive heart failure) (HCC)    EF 30 to 35 % as of 09/2014.    CKD (chronic kidney disease), stage III ([place])    Complication of anesthesia 10/2014   "had to have defibrillator w/ERCP"   COPD (chronic obstructive pulmonary disease) ([place])    a. followed by pulmonary, COPD GOLD stage II   Depression    Diverticulosis of colon 07/2014   noted on CT   GERD (gastroesophageal reflux disease)    Hiatal hernia    Hyperglycemia 10/2012.   Hyperlipidemia    Hypertension    Myocardial infarction [facility]) 1994; 2011   Pneumonia 1946; 2015   Prostate enlargement 07/2014   observed on CT   Tobacco abuse    Ventricular tachycardia ([place])    a. 08/2009 s/p BSX E110 Teligen 100 AICD, ser#: [id number];  b. 08/2008 VT req ATP - detection reprogrammed from 160 to 150. c. EPS  and VT ablation by Dr. Lovena Le 12/21/2014   Past Surgical History:  Procedure Laterality Date   BIOPSY  12/21/2017   Procedure: BIOPSY;  Surgeon: [name], MD;  [facility];  Service: Endoscopy;;   CATARACT EXTRACTION W/ INTRAOCULAR LENS  IMPLANT, BILATERAL Bilateral ~ 2011   COLONOSCOPY     COLONOSCOPY WITH PROPOFOL N/A 12/21/2017   Procedure: COLONOSCOPY WITH PROPOFOL;  Surgeon: [name], MD;  [facility];  Service: Endoscopy;  Laterality: N/A;   ELECTROPHYSIOLOGIC STUDY N/A 12/21/2014   Procedure: V Tach Ablation;  Surgeon: [name], MD;  Location: [place] [facility];   Service: Cardiovascular;  Laterality: N/A;   ERCP N/A 11/16/2014   Procedure: ENDOSCOPIC RETROGRADE CHOLANGIOPANCREATOGRAPHY (ERCP);  Surgeon: [name], MD;  Location: [place];  Service: Endoscopy;  Laterality: N/A;   ESOPHAGOGASTRODUODENOSCOPY (EGD) WITH PROPOFOL N/A 12/21/2017   Procedure: ESOPHAGOGASTRODUODENOSCOPY (EGD) WITH PROPOFOL;  Surgeon: [name], MD;  [facility];  Service: Endoscopy;  Laterality: N/A;   EYE SURGERY     FOOT SURGERY Left 2005   "fixed bone that stuck out in my ankle area"   HEMORRHOID BANDING     IMPLANTABLE CARDIOVERTER DEFIBRILLATOR IMPLANT  09/06/09   BSX dual chamber ICD implanted in [place] for cardiac arrest 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   [place] Right ~ [place] N/A 11/25/2012   demonstrated EF 30%, inferior akinesis with mild hypokinesis of all walls, patent LAD and RCA stents; ostial PDA with 80-90% obstruction with medical therapy recommended   MOHS SURGERY  2008   nose, skin graft   POLYPECTOMY  12/21/2017   Procedure: POLYPECTOMY;  Surgeon: [name], MD;  [facility];  Service: Endoscopy;;   RETINAL DETACHMENT SURGERY Right 2013   TENOLYSIS Right 12/21/2013   Procedure: TENOLYSIS FLEXOR CARPI RADIALIS ,DEBRIDEMENT RIGHT JOINT WRIST,DEBRIDEMENT SCAPHOTRAPEZIAL TRAPEZOID, REPAIR OF EXTENSOR HOOD;  Surgeon: Daryll Brod, MD;  Location: [place];  Service: Orthopedics;  Laterality: Right;   TOE SURGERY Right 09/2019   3rd toe/hammer toe   V-TACH ABLATION  12/21/2014   VIDEO BRONCHOSCOPY Bilateral 01/09/2016   Procedure: VIDEO BRONCHOSCOPY WITHOUT FLUORO;  Surgeon: Juanito Doom, MD;  [facility];  Service: Cardiopulmonary;  Laterality: Bilateral;   Social History:   reports that he has quit smoking. His smoking use included cigarettes. He has a 55.00 pack-year smoking history. He has never used smokeless tobacco. He reports  current alcohol use. He reports that he does not use drugs.  Family History  Problem Relation Age of Onset   Heart attack Brother    CAD Father    Hypertension Father    CAD Mother    Hypertension Mother    Hypertension Brother    Stroke Neg Hx     Medications: Patient's Medications  New Prescriptions   No medications on file  Previous Medications   ACETAMINOPHEN (TYLENOL) 500 MG TABLET    Take 2 tablets by mouth daily.   ALBUTEROL (PROVENTIL) (2.5 MG/3ML) 0.083% NEBULIZER SOLUTION    USE 1 VIAL VIA NEBULIZER EVERY 6 HOURS AS NEEDED FOR WHEEZING OR SHORTNESS OF BREATH   AMIODARONE (PACERONE) 200 MG TABLET    Take 1 tablet (200 mg total) by mouth daily.   APIXABAN (ELIQUIS) 5 MG TABS TABLET    Take 2 tablets (10 mg total) by mouth 2 (two) times daily for 7 days.   APIXABAN (ELIQUIS)  5 MG TABS TABLET    Take 1 tablet (5 mg total) by mouth 2 (two) times daily.   ARTIFICIAL TEAR SOLUTION (SOOTHE XP OP)    Place 2 drops into both eyes daily as needed (dry eyes).   ATORVASTATIN (LIPITOR) 80 MG TABLET    TAKE 1 TABLET(80 MG) BY MOUTH DAILY   BENAZEPRIL (LOTENSIN) 10 MG TABLET    Take 1 tablet (10 mg total) by mouth daily.   BUDESONIDE-FORMOTEROL (SYMBICORT) 160-4.5 MCG/ACT INHALER    Inhale 2 puffs into the lungs 2 (two) times daily.   BUPROPION (WELLBUTRIN) 75 MG TABLET    Take 1 tablet (75 mg total) by mouth daily.   BUSPIRONE (BUSPAR) 15 MG TABLET    TAKE 1 TABLET BY MOUTH TWICE A DAY   CARVEDILOL (COREG) 3.125 MG TABLET    Take 1 tablet (3.125 mg total) by mouth 2 (two) times daily.   CETIRIZINE (ZYRTEC) 10 MG TABLET    Take 1 tablet by mouth daily as needed.   CHOLINE FENOFIBRATE (FENOFIBRIC ACID) 135 MG CPDR    Take 1 tablet by mouth daily.   ESCITALOPRAM (LEXAPRO) 20 MG TABLET    TAKE 1 TABLET BY MOUTH EVERY DAY   FINASTERIDE (PROSCAR) 5 MG TABLET    Take 5 mg by mouth daily.   FLUTICASONE (FLONASE) 50 MCG/ACT NASAL SPRAY    Place 2 sprays into both nostrils as needed.     FUROSEMIDE (LASIX) 40 MG TABLET    Take 1/2 tablet by mouth daily.   GABAPENTIN (NEURONTIN) 100 MG CAPSULE    Take 1 capsule (100 mg total) by mouth at bedtime.   GUAIFENESIN (MUCINEX) 600 MG 12 HR TABLET    Take 600 mg by mouth 2 (two) times daily.   HYDROCORTISONE 1 % OINTMENT    Apply 1 application topically 2 (two) times daily.   IPRATROPIUM-ALBUTEROL (COMBIVENT RESPIMAT) 20-100 MCG/ACT AERS RESPIMAT    Inhale 1 puff into the lungs every 6 (six) hours. Shortness of breath or wheezing   IRON PO    Take by mouth daily.    MAGNESIUM-OXIDE 400 (241.3 MG) MG TABLET    TAKE 1 TABSULE BY MOUTH DAILY   MEXILETINE (MEXITIL) 200 MG CAPSULE    TAKE 1 CAPSULE BY MOUTH TWICE A DAY   MULTIPLE VITAMINS-MINERALS (CENTRUM ADULTS PO)    Take by mouth daily.   NITROGLYCERIN (NITROSTAT) 0.4 MG SL TABLET    DISSOLVE 1 TABLET UNDER THE TONGUE EVERY 5 MINUTES FOR 3 DOSES AS NEEDED FOR CHEST PAIN   OMEPRAZOLE (PRILOSEC) 20 MG CAPSULE    Take 20 mg by mouth daily.    POTASSIUM CHLORIDE SA (KLOR-CON M20) 20 MEQ TABLET    TAKE 1 TABLET BY MOUTH EVERY DAY   RESPIRATORY THERAPY SUPPLIES (FLUTTER) DEVI    Use as directed   SENNOSIDES-DOCUSATE SODIUM (STOOL SOFTENER/LAXATIVE PO)    Take 2 tablets by mouth daily.   SIMETHICONE (GAS-X) 80 MG CHEWABLE TABLET    Chew 1 tablet (80 mg total) by mouth every 6 (six) hours as needed for flatulence.   SPACER/AERO-HOLDING CHAMBERS (OPTICHAMBER DIAMOND) MISC    optichamber VHC   TAMSULOSIN (FLOMAX) 0.4 MG CAPS CAPSULE    TAKE 1 CAPSULE(0.4 MG) BY MOUTH DAILY AFTER SUPPER   TIOTROPIUM BROMIDE MONOHYDRATE (SPIRIVA RESPIMAT) 2.5 MCG/ACT AERS    Inhale 2 puffs into the lungs daily.   VITAMIN B-12 (CYANOCOBALAMIN) 1000 MCG TABLET    Take 1 tablet (1,000 mcg total) by mouth  daily.  Modified Medications   No medications on file  Discontinued Medications   No medications on file    Physical Exam:  There were no vitals filed for this visit. There is no height or weight on file to  calculate BMI. Wt Readings from Last 3 Encounters:  11/14/20 204 lb 9.6 oz (92.8 kg)  09/23/20 209 lb 12.8 oz (95.2 kg)  07/16/20 214 lb 6.4 oz (97.3 kg)    Physical Exam Vitals reviewed.  Constitutional:      General: He is not in acute distress. HENT:     Head: Normocephalic.  Eyes:     General:        Right eye: No discharge.        Left eye: No discharge.  Cardiovascular:     Rate and Rhythm: Normal rate. Rhythm irregular.     Pulses: Normal pulses.     Heart sounds: Normal heart sounds. No murmur heard. Pulmonary:     Effort: Pulmonary effort is normal. No respiratory distress.     Breath sounds: Normal breath sounds. No wheezing.  Abdominal:     General: Bowel sounds are normal. There is no distension.     Palpations: Abdomen is soft.     Tenderness: There is no abdominal tenderness.  Musculoskeletal:     Right lower leg: Edema present.     Left lower leg: Edema present.     Comments: Non-pitting, BLE cool to touch, non tender  Skin:    General: Skin is warm and dry.     Capillary Refill: Capillary refill takes less than 2 seconds.     Comments: 2 nickel sized skin tears to left upper shin, CDI, granulation tissue present, serosanguinous drainage, mild odor, surrounding skin with erythema/swelling.   Neurological:     General: No focal deficit present.     Mental Status: He is alert and oriented to person, place, and time.     Sensory: No sensory deficit.     Motor: No weakness.     Coordination: Coordination normal.     Gait: Gait normal.     Deep Tendon Reflexes: Reflexes normal.  Psychiatric:        Mood and Affect: Mood normal.        Behavior: Behavior normal.    Labs reviewed: Basic Metabolic Panel: Recent Labs    06/07/20 1434 07/07/20 0554 11/14/20 1502  NA 142 136 138  K 4.4 3.7 4.9  CL 102 102 103  CO2 23 26 28   GLUCOSE 90 102* 83  BUN 11 21 13   CREATININE 1.08 1.11 1.04  CALCIUM 9.5 9.2 10.0  TSH 2.290  --   --    Liver Function  Tests: Recent Labs    03/01/20 1344 06/07/20 1434 11/14/20 1502  AST 34 61* 46*  ALT 45 67* 51*  ALKPHOS  --  54  --   BILITOT 0.6 0.8 0.8  PROT 6.4 6.6 6.4  ALBUMIN  --  4.3  --    No results for input(s): LIPASE, AMYLASE in the last 8760 hours. No results for input(s): AMMONIA in the last 8760 hours. CBC: Recent Labs    07/07/20 0554 11/14/20 1502 12/20/20 1410  WBC 4.2 4.5 3.3*  NEUTROABS 2.4 2,907 2,056  HGB 11.9* 12.9* 12.0*  HCT 36.2* 38.1* 36.9*  MCV 102.5* 100.5* 102.2*  PLT 167 119* 158   Lipid Panel: Recent Labs    03/01/20 1344  CHOL 136  HDL 54  LDLCALC  67  TRIG 71  CHOLHDL 2.5   TSH: Recent Labs    06/07/20 1434  TSH 2.290   A1C: Lab Results  Component Value Date   HGBA1C 5.6 12/29/2016     Assessment/Plan 1. Cellulitis of right leg - 2 skin tears slow healing, mild redness and swelling around wounds - doxycycline (VIBRA-TABS) 100 MG tablet; Take 1 tablet (100 mg total) by mouth 2 (two) times daily.  Dispense: 20 tablet; Refill: 0  2. Peripheral polyneuropathy - gabapentin (NEURONTIN) 300 MG capsule; Take 1 capsule (300 mg total) by mouth 3 (three) times daily.  Dispense: 90 capsule; Refill: 1  3. Chronic systolic heart failure  - LV EF 40-45% - no weight fluctuations, sob, non-pitting ankle edema - ankle edema occurring more often- requiring lasix more frequently - cont lasix 40 mg daily prn - recommend low sodium diet - recommend seeing cardiology sooner- discussed with patient  Total time: 31 minutes. Greater than 50 % of total time spent doing patient education on wound care and symptom management.   Next appt: 03/06/2021  [name], [facility] [phone]

## 2020-12-25 NOTE — Addendum Note (Signed)
Addended by: Stanton Kidney on: 12/25/2020 08:25 AM   Modules accepted: Orders

## 2020-12-30 ENCOUNTER — Other Ambulatory Visit: Payer: Self-pay | Admitting: *Deleted

## 2020-12-30 MED ORDER — TAMSULOSIN HCL 0.4 MG PO CAPS: ORAL_CAPSULE | ORAL | 1 refills | Status: DC

## 2020-12-30 NOTE — Telephone Encounter (Signed)
Patient requested refill.  Pended Rx and sent to Amy for approval due to [place].

## 2021-01-02 ENCOUNTER — Other Ambulatory Visit: Payer: Self-pay | Admitting: *Deleted

## 2021-01-02 ENCOUNTER — Other Ambulatory Visit (INDEPENDENT_AMBULATORY_CARE_PROVIDER_SITE_OTHER): Payer: Medicare Other

## 2021-01-02 ENCOUNTER — Encounter: Payer: Self-pay | Admitting: Internal Medicine

## 2021-01-02 ENCOUNTER — Ambulatory Visit (INDEPENDENT_AMBULATORY_CARE_PROVIDER_SITE_OTHER): Payer: Medicare Other | Admitting: Internal Medicine

## 2021-01-02 VITALS — BP 110/68 | HR 62 | Ht 72.0 in | Wt 206.0 lb

## 2021-01-02 DIAGNOSIS — Z86010 Personal history of colonic polyps: Secondary | ICD-10-CM | POA: Diagnosis not present

## 2021-01-02 DIAGNOSIS — R7989 Other specified abnormal findings of blood chemistry: Secondary | ICD-10-CM

## 2021-01-02 DIAGNOSIS — K449 Diaphragmatic hernia without obstruction or gangrene: Secondary | ICD-10-CM

## 2021-01-02 DIAGNOSIS — K219 Gastro-esophageal reflux disease without esophagitis: Secondary | ICD-10-CM | POA: Diagnosis not present

## 2021-01-02 LAB — IBC PANEL
Iron: 113 ug/dL (ref 42–165)
Saturation Ratios: 34.8 % (ref 20.0–50.0)
TIBC: 324.8 ug/dL (ref 250.0–450.0)
Transferrin: 232 mg/dL (ref 212.0–360.0)

## 2021-01-02 LAB — PROTIME-INR
INR: 1.4 ratio — ABNORMAL HIGH (ref 0.8–1.0)
Prothrombin Time: 14.7 s — ABNORMAL HIGH (ref 9.6–13.1)

## 2021-01-02 LAB — FERRITIN: Ferritin: 153.7 ng/mL (ref 22.0–322.0)

## 2021-01-02 NOTE — Patient Instructions (Addendum)
If you are age 82 or older, your body mass index should be between 23-30. Your Body mass index is 27.94 kg/m. If this is out of the aforementioned range listed, please consider follow up with your Primary Care Provider.  If you are age 67 or younger, your body mass index should be between 19-25. Your Body mass index is 27.94 kg/m. If this is out of the aformentioned range listed, please consider follow up with your Primary Care Provider.   __________________________________________________________  The [facility] providers would like to encourage you to use [facility] to communicate with providers for non-urgent requests or questions.  Due to long hold times on the telephone, sending your provider a message by [facility] may be a faster and more efficient way to get a response.  Please allow 48 business hours for a response.  Please remember that this is for non-urgent requests.   Your provider has requested that you go to the basement level for lab work before leaving today. Press "B" on the elevator. The lab is located at the first door on the left as you exit the elevator.  Please follow up in three months

## 2021-01-07 ENCOUNTER — Encounter: Payer: Self-pay | Admitting: Internal Medicine

## 2021-01-07 LAB — ANA: Anti Nuclear Antibody (ANA): POSITIVE — AB

## 2021-01-07 LAB — ANTI-SMOOTH MUSCLE ANTIBODY, IGG: Actin (Smooth Muscle) Antibody (IGG): <20 U (ref <20)

## 2021-01-07 LAB — HEPATITIS B SURFACE ANTIGEN: Hepatitis B Surface Ag: NONREACTIVE

## 2021-01-07 LAB — ALPHA-1-ANTITRYPSIN: A-1 Antitrypsin, Ser: 155 mg/dL (ref 83–199)

## 2021-01-07 LAB — ANTI-NUCLEAR AB-TITER (ANA TITER): ANA Titer 1: 1:80 {titer} — ABNORMAL HIGH

## 2021-01-07 LAB — HEPATITIS B SURFACE ANTIBODY,QUALITATIVE: Hep B S Ab: NONREACTIVE

## 2021-01-07 LAB — HEPATITIS C ANTIBODY
Hepatitis C Ab: NONREACTIVE
SIGNAL TO CUT-OFF: 0.01 (ref <1.00)

## 2021-01-07 LAB — MITOCHONDRIAL ANTIBODIES: Mitochondrial M2 Ab, IgG: <=20 U (ref <=20.0)

## 2021-01-07 NOTE — Progress Notes (Signed)
HISTORY OF PRESENT ILLNESS:  Steven Guzman is a 80 y.o. male with multiple significant advanced medical problems as listed below.  He is sent today regarding mild elevation of hepatic transaminases.  Blood work from November 14, 2020 shows AST 46, ALT 51, normal alkaline phosphatase and total bilirubin.  CBC remarkable for hemoglobin 12.0, MCV 102.2, and platelet count of 158,000 right upper quadrant ultrasound obtained November 21, 2020 showed changes consistent with either mild hepatic steatosis or possibly early cirrhosis.  He did have a CT of the abdomen and pelvis with contrast September 2019.  The liver had no significant abnormalities except for incidental cystic lesions.  He denies a family or personal history of liver disease.  He does not use alcohol.  He is on amiodarone and high-dose Lipitor.  Last colonoscopy 2019.  He has aged out of surveillance.  Last upper endoscopy 2019.  No varices.  REVIEW OF SYSTEMS:  All non-GI ROS negative as otherwise stated in the HPI except for anxiety, arthritis, back pain, cough, sinus and allergy, itching, lower extremity swelling, shortness of breath  Past Medical History:  Diagnosis Date   AICD (automatic cardioverter/defibrillator) present    Aneurysm ([place])    a. Aneurysmal infrarenal aorta up to 33 mm on CT 10/2014, recommended f/u due 10/2017   Anginal pain ([place])    Anxiety    Basal cell carcinoma of nose    S/P MOHS   Biliary acute pancreatitis    CAD (coronary artery disease)    a. s/p MI in 1994 with PCI to LAD at that time b. cath 10/2012 demonstrated EF 30%, inferior akinesis with mild hypokinesis of all walls, patent LAD and RCA stents; ostial PDA with 80-90% obstruction with medical therapy recommended    Chronic systolic CHF (congestive heart failure) (HCC)    EF 30 to 35 % as of 09/2014.    CKD (chronic kidney disease), stage III ([place])    Complication of anesthesia 10/2014   "had to have defibrillator w/ERCP"   COPD (chronic obstructive  pulmonary disease) ([place])    a. followed by pulmonary, COPD GOLD stage II   Depression    Diverticulosis of colon 07/2014   noted on CT   GERD (gastroesophageal reflux disease)    Hiatal hernia    Hyperglycemia 10/2012.   Hyperlipidemia    Hypertension    Myocardial infarction [facility]) 1994; 2011   Pneumonia 1946; 2015   Prostate enlargement 07/2014   observed on CT   Tobacco abuse    Ventricular tachycardia    a. 08/2009 s/p BSX E110 Teligen 100 AICD, ser#: [id number];  b. 08/2008 VT req ATP - detection reprogrammed from 160 to 150. c. EPS and VT ablation by Dr. Lovena Le 12/21/2014    Past Surgical History:  Procedure Laterality Date   BIOPSY  12/21/2017   Procedure: BIOPSY;  Surgeon: [name], MD;  [facility];  Service: Endoscopy;;   CATARACT EXTRACTION W/ INTRAOCULAR LENS  IMPLANT, BILATERAL Bilateral ~ 2011   COLONOSCOPY     COLONOSCOPY WITH PROPOFOL N/A 12/21/2017   Procedure: COLONOSCOPY WITH PROPOFOL;  Surgeon: [name], MD;  [facility];  Service: Endoscopy;  Laterality: N/A;   ELECTROPHYSIOLOGIC STUDY N/A 12/21/2014   Procedure: V Tach Ablation;  Surgeon: [name], MD;  Location: [place] [facility];  Service: Cardiovascular;  Laterality: N/A;   ERCP N/A 11/16/2014   Procedure: ENDOSCOPIC RETROGRADE CHOLANGIOPANCREATOGRAPHY (ERCP);  Surgeon: [name], MD;  Location: [facility]  ENDOSCOPY;  Service: Endoscopy;  Laterality: N/A;   ESOPHAGOGASTRODUODENOSCOPY (EGD) WITH PROPOFOL N/A 12/21/2017   Procedure: ESOPHAGOGASTRODUODENOSCOPY (EGD) WITH PROPOFOL;  Surgeon: [name], MD;  [facility];  Service: Endoscopy;  Laterality: N/A;   EYE SURGERY     FOOT SURGERY Left 2005   "fixed bone that stuck out in my ankle area"   HEMORRHOID BANDING     IMPLANTABLE CARDIOVERTER DEFIBRILLATOR IMPLANT  09/06/09   BSX dual chamber ICD implanted in [place] for cardiac arrest 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   [place] Right ~ [place] N/A 11/25/2012   demonstrated EF 30%, inferior akinesis with mild hypokinesis of all walls, patent LAD and RCA stents; ostial PDA with 80-90% obstruction with medical therapy recommended   MOHS SURGERY  2008   nose, skin graft   POLYPECTOMY  12/21/2017   Procedure: POLYPECTOMY;  Surgeon: [name], MD;  [facility];  Service: Endoscopy;;   RETINAL DETACHMENT SURGERY Right 2013   TENOLYSIS Right 12/21/2013   Procedure: TENOLYSIS FLEXOR CARPI RADIALIS ,DEBRIDEMENT RIGHT JOINT WRIST,DEBRIDEMENT SCAPHOTRAPEZIAL TRAPEZOID, REPAIR OF EXTENSOR HOOD;  Surgeon: Daryll Brod, MD;  Location: [place];  Service: Orthopedics;  Laterality: Right;   TOE SURGERY Right 09/2019   3rd toe/hammer toe   V-TACH ABLATION  12/21/2014   VIDEO BRONCHOSCOPY Bilateral 01/09/2016   Procedure: VIDEO BRONCHOSCOPY WITHOUT FLUORO;  Surgeon: Juanito Doom, MD;  [facility];  Service: Cardiopulmonary;  Laterality: Bilateral;    Social History Steven Guzman  reports that he has quit smoking. His smoking use included cigarettes. He has a 55.00 pack-year smoking history. He has never used smokeless tobacco. He reports current alcohol use. He reports that he does not use drugs.  family history includes CAD in his father and mother; Heart attack in his brother; Hypertension in his brother, father, and mother.  Allergies  Allergen Reactions   Sulfa Antibiotics Hives       PHYSICAL EXAMINATION: Vital signs: BP 110/68   Pulse 62   Ht 6' (1.829 m)   Wt 206 lb (93.4 kg)   SpO2 96%   BMI 27.94 kg/m   Constitutional: generally well-appearing for age, no acute distress Psychiatric: alert and oriented x3, cooperative Eyes: extraocular movements intact, anicteric, conjunctiva pink Mouth: oral pharynx moist, no lesions Neck: supple no lymphadenopathy Cardiovascular: heart regular rate and rhythm, no murmur Lungs: clear to auscultation  bilaterally Abdomen: soft, nontender, nondistended, no obvious ascites, no peritoneal signs, normal bowel sounds, no organomegaly Rectal: Omitted Groin.  Large left inguinal hernia. Extremities: no clubbing or cyanosis.  Chronic stasis changes bilaterally in addition to 1+ lower extremity edema bilaterally Skin: no lesions on visible extremities Neuro: No focal deficits. No asterixis.     ASSESSMENT:  1.  Mild elevation of hepatic transaminases.  Multiple possible etiologies including medication, fatty liver, congestion.  No evidence for hepatic synthetic dysfunction 2.  Multiple significant medical problems 3.  History of adenomatous colon polyps.  Aged out of surveillance 4.  GERD.  Large hiatal hernia with erosions as a cause for previous iron deficiency anemia.  On omeprazole  PLAN:  1.  Multiple laboratories today to assess for other causes for chronic elevated liver tests outside of the above differential. 2.  Hold Lipitor 3.  Office follow-up 3 months A total time of 45 minutes was spent preparing to see the patient, reviewing outside tests and x-rays, obtaining comprehensive history,  performing medically appropriate physical exam, counseling the patient regarding his above listed issues, ordering advanced laboratory studies, arranging clinical follow-up, and documenting clinical information in the health record

## 2021-02-02 ENCOUNTER — Other Ambulatory Visit: Payer: Self-pay | Admitting: Nurse Practitioner

## 2021-02-02 DIAGNOSIS — I5022 Chronic systolic (congestive) heart failure: Secondary | ICD-10-CM

## 2021-02-02 DIAGNOSIS — I25810 Atherosclerosis of coronary artery bypass graft(s) without angina pectoris: Secondary | ICD-10-CM

## 2021-02-13 ENCOUNTER — Encounter: Payer: Self-pay | Admitting: Orthopedic Surgery

## 2021-02-13 ENCOUNTER — Other Ambulatory Visit: Payer: Self-pay

## 2021-02-13 ENCOUNTER — Ambulatory Visit: Payer: Medicare Other | Admitting: Orthopedic Surgery

## 2021-02-13 VITALS — BP 132/76 | HR 64 | Temp 95.9°F | Ht 72.0 in | Wt 207.4 lb

## 2021-02-13 DIAGNOSIS — R600 Localized edema: Secondary | ICD-10-CM | POA: Diagnosis not present

## 2021-02-13 DIAGNOSIS — R42 Dizziness and giddiness: Secondary | ICD-10-CM

## 2021-02-13 DIAGNOSIS — J302 Other seasonal allergic rhinitis: Secondary | ICD-10-CM | POA: Diagnosis not present

## 2021-02-13 DIAGNOSIS — R748 Abnormal levels of other serum enzymes: Secondary | ICD-10-CM

## 2021-02-13 DIAGNOSIS — J358 Other chronic diseases of tonsils and adenoids: Secondary | ICD-10-CM | POA: Diagnosis not present

## 2021-02-13 DIAGNOSIS — J449 Chronic obstructive pulmonary disease, unspecified: Secondary | ICD-10-CM

## 2021-02-13 DIAGNOSIS — J411 Mucopurulent chronic bronchitis: Secondary | ICD-10-CM

## 2021-02-13 MED ORDER — FLUTICASONE PROPIONATE 50 MCG/ACT NA SUSP: 2.0000 | Freq: Every day | NASAL | 6 refills | Status: AC

## 2021-02-13 NOTE — Patient Instructions (Addendum)
Continue to take lasix 20 mg daily- if your leg becomes swollen- take furosemide 40 mg daily x 3 days  Try plain mucinex twice daily for chronic cough with congestion  Make appointment with ENT- check tonsil stones  Dizziness occurs- take your blood pressure and rest- look up Marye Round- if blood pressure are low contact PCP or cardiology  May try saline nasal spray for post nasal drip

## 2021-02-13 NOTE — Progress Notes (Signed)
Careteam: Patient Care Team: Yvonna Alanis, NP as PCP - General (Adult Health Nurse Practitioner) Belva Crome, MD as PCP - Cardiology (Cardiology) [name], MD as PCP - Electrophysiology (Cardiology) Roel Cluck, MD as Referring Physician (Ophthalmology) Harriett Sine, MD as Consulting Physician (Dermatology)  Seen by: [name], AGNP-C  PLACE OF SERVICE:  [place] Directive information    Allergies  Allergen Reactions   Sulfa Antibiotics Hives    Chief Complaint  Patient presents with   Medical Management of Chronic Issues    Patinet presents today for leg & foot swelling.   Quality Metric Gaps    Pneumonia, Flu and COVID booster     HPI: Patient is a 80 y.o. male seen today for acute visit due to lower leg swelling.   Right leg swelling began a few days ago. He is taking furosemide 20 mg daily. Denies recent injury to leg, increased pain, swelling or heat. Continues to take Eliquis daily. Follows low sodium diet.   Intermittent dizziness recently. He was putting away dishes when all of a sudden he became dizzy. Describes room spinning around him. He has a history of vertigo. He did not check his blood pressure when incidents occurred. No recent falls. Continues to ambulate with cane.   Intermittent sore throat. Denies trouble swallowing foods or fluids. He reports history of tonsil stones. Plans to schedule appointment with ENT.   He is having more nasal congestion. Believes it is related to seasonal allergies. Requesting refill of Flonase. Continues to take Zyrtec daily.   Chronic cough has become more productive. Sputum described as thick, clear/tan. He has used mucinex in the past.   Recently seen by GI for elevated liver enzymes. Advised to hold Lipitor and follow up in 3 months.      Review of Systems:  Review of Systems  Constitutional:  Negative for chills, fever, malaise/fatigue and weight loss.  HENT:  Positive for  congestion and sore throat.   Eyes: Negative.   Respiratory:  Negative for cough, shortness of breath and wheezing.   Cardiovascular:  Positive for leg swelling. Negative for chest pain.  Gastrointestinal:  Negative for abdominal pain, constipation, diarrhea, heartburn, nausea and vomiting.  Genitourinary: Negative.   Musculoskeletal:  Negative for falls.  Skin: Negative.   Neurological:  Positive for dizziness. Negative for weakness and headaches.  Psychiatric/Behavioral:  Negative for depression. The patient is not nervous/anxious.    Past Medical History:  Diagnosis Date   AICD (automatic cardioverter/defibrillator) present    Aneurysm ([place])    a. Aneurysmal infrarenal aorta up to 33 mm on CT 10/2014, recommended f/u due 10/2017   Anginal pain ([place])    Anxiety    Basal cell carcinoma of nose    S/P MOHS   Biliary acute pancreatitis    CAD (coronary artery disease)    a. s/p MI in 1994 with PCI to LAD at that time b. cath 10/2012 demonstrated EF 30%, inferior akinesis with mild hypokinesis of all walls, patent LAD and RCA stents; ostial PDA with 80-90% obstruction with medical therapy recommended    Chronic systolic CHF (congestive heart failure) (HCC)    EF 30 to 35 % as of 09/2014.    CKD (chronic kidney disease), stage III ([place])    Complication of anesthesia 10/2014   "had to have defibrillator w/ERCP"   COPD (chronic obstructive pulmonary disease) ([place])    a. followed by pulmonary, COPD GOLD stage II  Depression    Diverticulosis of colon 07/2014   noted on CT   GERD (gastroesophageal reflux disease)    Hiatal hernia    Hyperglycemia 10/2012.   Hyperlipidemia    Hypertension    Myocardial infarction [facility]) 1994; 2011   Pneumonia 1946; 2015   Prostate enlargement 07/2014   observed on CT   Tobacco abuse    Ventricular tachycardia    a. 08/2009 s/p BSX E110 Teligen 100 AICD, ser#: [id number];  b. 08/2008 VT req ATP - detection reprogrammed from 160 to 150. c. EPS and VT ablation by  Dr. Lovena Le 12/21/2014   Past Surgical History:  Procedure Laterality Date   BIOPSY  12/21/2017   Procedure: BIOPSY;  Surgeon: [name], MD;  Location: [facility];  Service: Endoscopy;;   CATARACT EXTRACTION W/ INTRAOCULAR LENS  IMPLANT, BILATERAL Bilateral ~ 2011   COLONOSCOPY     COLONOSCOPY WITH PROPOFOL N/A 12/21/2017   Procedure: COLONOSCOPY WITH PROPOFOL;  Surgeon: [name], MD;  Location: [facility];  Service: Endoscopy;  Laterality: N/A;   ELECTROPHYSIOLOGIC STUDY N/A 12/21/2014   Procedure: V Tach Ablation;  Surgeon: [name], MD;  Location: [place] [facility];  Service: Cardiovascular;  Laterality: N/A;   ERCP N/A 11/16/2014   Procedure: ENDOSCOPIC RETROGRADE CHOLANGIOPANCREATOGRAPHY (ERCP);  Surgeon: [name], MD;  Location: [place];  Service: Endoscopy;  Laterality: N/A;   ESOPHAGOGASTRODUODENOSCOPY (EGD) WITH PROPOFOL N/A 12/21/2017   Procedure: ESOPHAGOGASTRODUODENOSCOPY (EGD) WITH PROPOFOL;  Surgeon: [name], MD;  [facility];  Service: Endoscopy;  Laterality: N/A;   EYE SURGERY     FOOT SURGERY Left 2005   "fixed bone that stuck out in my ankle area"   HEMORRHOID BANDING     IMPLANTABLE CARDIOVERTER DEFIBRILLATOR IMPLANT  09/06/09   BSX dual chamber ICD implanted in [place] for cardiac arrest 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   [place] Right ~ [place] N/A 11/25/2012   demonstrated EF 30%, inferior akinesis with mild hypokinesis of all walls, patent LAD and RCA stents; ostial PDA with 80-90% obstruction with medical therapy recommended   MOHS SURGERY  2008   nose, skin graft   POLYPECTOMY  12/21/2017   Procedure: POLYPECTOMY;  Surgeon: [name], MD;  [facility];  Service: Endoscopy;;   RETINAL DETACHMENT SURGERY Right 2013   TENOLYSIS Right 12/21/2013   Procedure: TENOLYSIS FLEXOR CARPI RADIALIS ,DEBRIDEMENT RIGHT JOINT WRIST,DEBRIDEMENT SCAPHOTRAPEZIAL  TRAPEZOID, REPAIR OF EXTENSOR HOOD;  Surgeon: Daryll Brod, MD;  Location: [place];  Service: Orthopedics;  Laterality: Right;   TOE SURGERY Right 09/2019   3rd toe/hammer toe   V-TACH ABLATION  12/21/2014   VIDEO BRONCHOSCOPY Bilateral 01/09/2016   Procedure: VIDEO BRONCHOSCOPY WITHOUT FLUORO;  Surgeon: Juanito Doom, MD;  [facility];  Service: Cardiopulmonary;  Laterality: Bilateral;   Social History:   reports that he has quit smoking. His smoking use included cigarettes. He has a 55.00 pack-year smoking history. He has never used smokeless tobacco. He reports current alcohol use. He reports that he does not use drugs.  Family History  Problem Relation Age of Onset   Heart attack Brother    CAD Father    Hypertension Father    CAD Mother    Hypertension Mother    Hypertension Brother    Stroke Neg Hx     Medications: Patient's Medications  New Prescriptions   No medications on file  Previous Medications   ALBUTEROL (PROVENTIL) (2.5 MG/3ML) 0.083% NEBULIZER SOLUTION    USE 1 VIAL VIA NEBULIZER EVERY 6 HOURS AS NEEDED FOR WHEEZING OR SHORTNESS OF BREATH   AMIODARONE (PACERONE) 200 MG TABLET    Take 1 tablet (200 mg total) by mouth daily.   APIXABAN (ELIQUIS) 5 MG TABS TABLET    Take 1 tablet (5 mg total) by mouth 2 (two) times daily.   ARTIFICIAL TEAR SOLUTION (SOOTHE XP OP)    Place 2 drops into both eyes daily as needed (dry eyes).   BENAZEPRIL (LOTENSIN) 10 MG TABLET    Take 1 tablet (10 mg total) by mouth daily.   BUDESONIDE-FORMOTEROL (SYMBICORT) 160-4.5 MCG/ACT INHALER    Inhale 2 puffs into the lungs 2 (two) times daily.   BUPROPION (WELLBUTRIN) 75 MG TABLET    Take 1 tablet (75 mg total) by mouth daily.   BUSPIRONE (BUSPAR) 15 MG TABLET    TAKE 1 TABLET BY MOUTH TWICE A DAY   CARVEDILOL (COREG) 3.125 MG TABLET    Take 1 tablet (3.125 mg total) by mouth 2 (two) times daily.   CETIRIZINE (ZYRTEC) 10 MG TABLET    Take 1 tablet by mouth daily as  needed.   CHOLINE FENOFIBRATE (FENOFIBRIC ACID) 135 MG CPDR    Take 1 tablet by mouth daily.   CYANOCOBALAMIN (VITAMIN B-12) 5000 MCG SUBL    Place under the tongue daily.   ESCITALOPRAM (LEXAPRO) 20 MG TABLET    TAKE 1 TABLET BY MOUTH EVERY DAY   FINASTERIDE (PROSCAR) 5 MG TABLET    Take 5 mg by mouth daily.   FLUTICASONE (FLONASE) 50 MCG/ACT NASAL SPRAY    Place 2 sprays into both nostrils as needed.    FUROSEMIDE (LASIX) 40 MG TABLET    Take 1/2 tablet by mouth daily.   GABAPENTIN (NEURONTIN) 300 MG CAPSULE    Take 1 capsule (300 mg total) by mouth 3 (three) times daily.   IPRATROPIUM-ALBUTEROL (COMBIVENT RESPIMAT) 20-100 MCG/ACT AERS RESPIMAT    Inhale 1 puff into the lungs every 6 (six) hours. Shortness of breath or wheezing   IRON PO    Take by mouth daily.    MAGNESIUM-OXIDE 400 (241.3 MG) MG TABLET    TAKE 1 TABSULE BY MOUTH DAILY   MEXILETINE (MEXITIL) 200 MG CAPSULE    TAKE 1 CAPSULE BY MOUTH TWICE A DAY   MULTIPLE VITAMINS-MINERALS (CENTRUM ADULTS PO)    Take by mouth daily.   NITROGLYCERIN (NITROSTAT) 0.4 MG SL TABLET    DISSOLVE 1 TABLET UNDER THE TONGUE EVERY 5 MINUTES FOR 3 DOSES AS NEEDED FOR CHEST PAIN   OMEPRAZOLE (PRILOSEC) 20 MG CAPSULE    Take 20 mg by mouth daily.    POTASSIUM CHLORIDE SA (KLOR-CON M20) 20 MEQ TABLET    TAKE 1 TABLET BY MOUTH EVERY DAY   RESPIRATORY THERAPY SUPPLIES (FLUTTER) DEVI    Use as directed   SENNOSIDES-DOCUSATE SODIUM (STOOL SOFTENER/LAXATIVE PO)    Take 2 tablets by mouth daily.   SPACER/AERO-HOLDING CHAMBERS (OPTICHAMBER DIAMOND) MISC    optichamber VHC   TAMSULOSIN (FLOMAX) 0.4 MG CAPS CAPSULE    Take one capsule by mouth once daily after supper.   TIOTROPIUM BROMIDE MONOHYDRATE (SPIRIVA RESPIMAT) 2.5 MCG/ACT AERS    Inhale 2 puffs into the lungs daily.  Modified Medications   No medications on file  Discontinued Medications   ATORVASTATIN (LIPITOR) 80 MG TABLET    TAKE 1 TABLET(80 MG) BY MOUTH DAILY  DOXYCYCLINE (VIBRA-TABS) 100 MG TABLET     Take 1 tablet (100 mg total) by mouth 2 (two) times daily.   GUAIFENESIN (MUCINEX) 600 MG 12 HR TABLET    Take 600 mg by mouth 2 (two) times daily.    Physical Exam:  Vitals:   02/13/21 1344  BP: 132/76  Pulse: 64  Temp: (!) 95.9 F (35.5 C)  SpO2: 97%  Weight: 207 lb 6.4 oz (94.1 kg)  Height: 6' (1.829 m)   Body mass index is 28.13 kg/m. Wt Readings from Last 3 Encounters:  02/13/21 207 lb 6.4 oz (94.1 kg)  01/02/21 206 lb (93.4 kg)  12/23/20 207 lb 6.4 oz (94.1 kg)    Physical Exam Vitals reviewed.  Constitutional:      General: He is not in acute distress. HENT:     Head: Normocephalic.     Nose: Nose normal.     Mouth/Throat:     Mouth: Mucous membranes are moist.     Comments: 2 small tonsil stones present- symmetrical, no swelling or redness around area, uvula midline Eyes:     General:        Right eye: No discharge.        Left eye: No discharge.     Extraocular Movements:     Right eye: Normal extraocular motion and no nystagmus.     Left eye: Normal extraocular motion and no nystagmus.  Neck:     Vascular: No carotid bruit.  Cardiovascular:     Rate and Rhythm: Normal rate. Rhythm irregular.     Pulses: Normal pulses.     Heart sounds: Normal heart sounds. No murmur heard. Pulmonary:     Effort: Pulmonary effort is normal. No respiratory distress.     Breath sounds: Rhonchi present. No wheezing.  Abdominal:     General: Bowel sounds are normal. There is no distension.     Palpations: Abdomen is soft.     Tenderness: There is no abdominal tenderness.  Musculoskeletal:     Cervical back: Normal range of motion.     Right lower leg: Edema present.     Left lower leg: Edema present.     Comments: Non pitting R>L  Lymphadenopathy:     Cervical: No cervical adenopathy.  Skin:    General: Skin is warm and dry.     Capillary Refill: Capillary refill takes less than 2 seconds.  Neurological:     General: No focal deficit present.     Mental  Status: He is alert and oriented to person, place, and time.     Motor: Weakness present.     Gait: Gait abnormal.     Comments: cane  Psychiatric:        Mood and Affect: Mood normal.        Behavior: Behavior normal.    Labs reviewed: Basic Metabolic Panel: Recent Labs    06/07/20 1434 07/07/20 0554 11/14/20 1502  NA 142 136 138  K 4.4 3.7 4.9  CL 102 102 103  CO2 23 26 28   GLUCOSE 90 102* 83  BUN 11 21 13   CREATININE 1.08 1.11 1.04  CALCIUM 9.5 9.2 10.0  TSH 2.290  --   --    Liver Function Tests: Recent Labs    03/01/20 1344 06/07/20 1434 11/14/20 1502  AST 34 61* 46*  ALT 45 67* 51*  ALKPHOS  --  54  --   BILITOT 0.6 0.8 0.8  PROT 6.4 6.6 6.4  ALBUMIN  --  4.3  --    No results for input(s): LIPASE, AMYLASE in the last 8760 hours. No results for input(s): AMMONIA in the last 8760 hours. CBC: Recent Labs    07/07/20 0554 11/14/20 1502 12/20/20 1410  WBC 4.2 4.5 3.3*  NEUTROABS 2.4 2,907 2,056  HGB 11.9* 12.9* 12.0*  HCT 36.2* 38.1* 36.9*  MCV 102.5* 100.5* 102.2*  PLT 167 119* 158   Lipid Panel: Recent Labs    03/01/20 1344  CHOL 136  HDL 54  LDLCALC 67  TRIG 71  CHOLHDL 2.5   TSH: Recent Labs    06/07/20 1434  TSH 2.290   A1C: Lab Results  Component Value Date   HGBA1C 5.6 12/29/2016     Assessment/Plan 1. Seasonal allergies - increased nasal congestion - exam unremarkable - cont zyrtec - fluticasone (FLONASE) 50 MCG/ACT nasal spray; Place 2 sprays into both nostrils daily.  Dispense: 16 g; Refill: 6  2. Edema of both lower extremities - non pitting R>L - advised to take lasix 40 mg x 3 days for increased edema - cont lasix 20 mg daily after - contact PCP if increased weight gain or increased edema, sob  3. Dizziness - 2 episodes of "room spinning" - exam unremarkable - discussed safety precautions with symptoms  4. Tonsillolith - tonsil stones present- one on each side - no sign of infection at this time - advised  to see ENT  5. Mucopurulent chronic bronchitis (HCC) - increased cough with tan/thick sputum - advised to restart mucinex 600 mg po bid  6. Elevated liver enzymes - followed by GI - advised to stop Lipitor x 3 months and recheck labs  Total time: 35 minutes. Greater than 50% of total time spent doing patient education on symptom and medication management.    Next appt: none Steven Guzman [place], [facility] [phone]

## 2021-02-14 ENCOUNTER — Other Ambulatory Visit: Payer: Self-pay | Admitting: *Deleted

## 2021-02-14 MED ORDER — GUAIFENESIN ER 600 MG PO TB12: 600.0000 mg | ORAL_TABLET | Freq: Two times a day (BID) | ORAL | 0 refills | Status: AC

## 2021-02-14 NOTE — Telephone Encounter (Signed)
Patient requested refill to be sent to [facility].  Pended Rx and sent to Amy for approval due to [place].

## 2021-02-16 MED ORDER — ESCITALOPRAM OXALATE 20 MG PO TABS: 20.0000 mg | ORAL_TABLET | Freq: Every day | ORAL | 1 refills | Status: DC

## 2021-02-17 ENCOUNTER — Ambulatory Visit (INDEPENDENT_AMBULATORY_CARE_PROVIDER_SITE_OTHER): Payer: Medicare Other

## 2021-02-17 DIAGNOSIS — I255 Ischemic cardiomyopathy: Secondary | ICD-10-CM

## 2021-02-19 ENCOUNTER — Encounter: Payer: Self-pay | Admitting: Interventional Cardiology

## 2021-02-22 LAB — CUP PACEART REMOTE DEVICE CHECK
Battery Remaining Longevity: 12 mo
Battery Remaining Percentage: 18 %
Brady Statistic RA Percent Paced: 2 %
Brady Statistic RV Percent Paced: 0 %
Date Time Interrogation Session: 20221123135700
HighPow Impedance: 55 Ohm
Implantable Lead Implant Date: 20110610
Implantable Lead Implant Date: 20110610
Implantable Lead Location: 753859
Implantable Lead Location: 753860
Implantable Lead Model: 185
Implantable Lead Model: 4135
Implantable Lead Serial Number: 28681386
Implantable Lead Serial Number: 339643
Implantable Pulse Generator Implant Date: 20110610
Lead Channel Impedance Value: 483 Ohm
Lead Channel Impedance Value: 566 Ohm
Lead Channel Pacing Threshold Amplitude: 0.9 V
Lead Channel Pacing Threshold Amplitude: 1.1 V
Lead Channel Pacing Threshold Pulse Width: 0.4 ms
Lead Channel Pacing Threshold Pulse Width: 0.4 ms
Lead Channel Setting Pacing Amplitude: 2 V
Lead Channel Setting Pacing Amplitude: 2.2 V
Lead Channel Setting Pacing Pulse Width: 0.4 ms
Lead Channel Setting Sensing Sensitivity: 0.6 mV
Pulse Gen Serial Number: 164892

## 2021-02-24 ENCOUNTER — Ambulatory Visit: Payer: Medicare Other | Admitting: Orthopedic Surgery

## 2021-02-24 ENCOUNTER — Encounter: Payer: Self-pay | Admitting: Orthopedic Surgery

## 2021-02-24 ENCOUNTER — Other Ambulatory Visit: Payer: Self-pay

## 2021-02-24 VITALS — BP 120/80 | HR 75 | Temp 97.5°F | Resp 16 | Ht 72.0 in | Wt 206.4 lb

## 2021-02-24 DIAGNOSIS — J411 Mucopurulent chronic bronchitis: Secondary | ICD-10-CM | POA: Diagnosis not present

## 2021-02-24 DIAGNOSIS — R42 Dizziness and giddiness: Secondary | ICD-10-CM

## 2021-02-24 DIAGNOSIS — M7989 Other specified soft tissue disorders: Secondary | ICD-10-CM | POA: Diagnosis not present

## 2021-02-24 DIAGNOSIS — J449 Chronic obstructive pulmonary disease, unspecified: Secondary | ICD-10-CM | POA: Diagnosis not present

## 2021-02-24 DIAGNOSIS — D696 Thrombocytopenia, unspecified: Secondary | ICD-10-CM

## 2021-02-24 MED ORDER — DOXYCYCLINE HYCLATE 100 MG PO TABS: 100.0000 mg | ORAL_TABLET | Freq: Two times a day (BID) | ORAL | 0 refills | Status: AC

## 2021-02-24 NOTE — Patient Instructions (Addendum)
Please report to ED if you experience chest pain and sob   Ultrasound of right lower leg ordered at [facility] at [facility]- [phone]  Take covid test at home  Please have pulmonary doctor forward note to [facility]- [phone]  You may take extra dose of gabapentin for pain

## 2021-02-24 NOTE — Progress Notes (Signed)
Careteam: Patient Care Team: Yvonna Alanis, NP as PCP - General (Adult Health Nurse Practitioner) Belva Crome, MD as PCP - Cardiology (Cardiology) [name], MD as PCP - Electrophysiology (Cardiology) Roel Cluck, MD as Referring Physician (Ophthalmology) Harriett Sine, MD as Consulting Physician (Dermatology)  Seen by: [name], AGNP-C  PLACE OF SERVICE:  [place] Directive information Does Patient Have a Medical Advance Directive?: No, Would patient like information on creating a medical advance directive?: No - Patient declined  Allergies  Allergen Reactions   Sulfa Antibiotics Hives    Chief Complaint  Patient presents with   Acute Visit    Patient complains of pain in lower right leg since yesterday.     HPI: Patient is a 80 y.o. male seen today for acute visit due to right leg pain.   He reports increased right leg pain, swelling, warmth and redness 2 days ago. He tried taking a extra dose of furosemide without success. Last night, he reports his right achilles was very painful, rated 7/10. He elevated his legs and placed a heating pad on them for relief. Denies recent injury to low legs, admits to bumping into things often. He was treated 09/26 for cellulitis, resolved with doxycycline. 06/29 he was treated for dvt of left upper arm with increased eliquis. Denies chest pain or shortness of breath today. Remains on Eliquis daily for atrial fibrillation.   In addition, he has not felt himself within the past day. Reports feeling tired and some mild dizziness. He does not want additional testing done today. He plans to take a covid test when he gets home. Ambulating with walker at home. Presents using cane today.   Intermittent shortness of breath still occurring. Used his nebulizer yesterday, symptoms resolved. Scheduled to see pulmonary( Dr. Chriss Czar with [facility]) next week.    Review of Systems:  Review of Systems  Constitutional:   Positive for malaise/fatigue. Negative for chills, fever and weight loss.  HENT:  Negative for congestion and sore throat.   Respiratory:  Positive for cough, shortness of breath and wheezing.   Cardiovascular:  Positive for leg swelling. Negative for chest pain.  Gastrointestinal:  Negative for abdominal pain, blood in stool, constipation, diarrhea, heartburn, nausea and vomiting.  Genitourinary:  Negative for hematuria.  Musculoskeletal:  Negative for falls, joint pain and myalgias.  Neurological:  Positive for dizziness and weakness. Negative for headaches.  Psychiatric/Behavioral:  Negative for depression. The patient is not nervous/anxious.    Past Medical History:  Diagnosis Date   AICD (automatic cardioverter/defibrillator) present    Aneurysm ([place])    a. Aneurysmal infrarenal aorta up to 33 mm on CT 10/2014, recommended f/u due 10/2017   Anginal pain ([place])    Anxiety    Basal cell carcinoma of nose    S/P MOHS   Biliary acute pancreatitis    CAD (coronary artery disease)    a. s/p MI in 1994 with PCI to LAD at that time b. cath 10/2012 demonstrated EF 30%, inferior akinesis with mild hypokinesis of all walls, patent LAD and RCA stents; ostial PDA with 80-90% obstruction with medical therapy recommended    Chronic systolic CHF (congestive heart failure) (HCC)    EF 30 to 35 % as of 09/2014.    CKD (chronic kidney disease), stage III ([place])    Complication of anesthesia 10/2014   "had to have defibrillator w/ERCP"   COPD (chronic obstructive pulmonary disease) ([place])  a. followed by pulmonary, COPD GOLD stage II   Depression    Diverticulosis of colon 07/2014   noted on CT   GERD (gastroesophageal reflux disease)    Hiatal hernia    Hyperglycemia 10/2012.   Hyperlipidemia    Hypertension    Myocardial infarction [facility]) 1994; 2011   Pneumonia 1946; 2015   Prostate enlargement 07/2014   observed on CT   Tobacco abuse    Ventricular tachycardia    a. 08/2009 s/p BSX E110 Teligen  100 AICD, ser#: [id number];  b. 08/2008 VT req ATP - detection reprogrammed from 160 to 150. c. EPS and VT ablation by Dr. Lovena Le 12/21/2014   Past Surgical History:  Procedure Laterality Date   BIOPSY  12/21/2017   Procedure: BIOPSY;  Surgeon: [name], MD;  Location: [facility];  Service: Endoscopy;;   CATARACT EXTRACTION W/ INTRAOCULAR LENS  IMPLANT, BILATERAL Bilateral ~ 2011   COLONOSCOPY     COLONOSCOPY WITH PROPOFOL N/A 12/21/2017   Procedure: COLONOSCOPY WITH PROPOFOL;  Surgeon: [name], MD;  Location: [facility];  Service: Endoscopy;  Laterality: N/A;   ELECTROPHYSIOLOGIC STUDY N/A 12/21/2014   Procedure: V Tach Ablation;  Surgeon: [name], MD;  Location: [place] [facility];  Service: Cardiovascular;  Laterality: N/A;   ERCP N/A 11/16/2014   Procedure: ENDOSCOPIC RETROGRADE CHOLANGIOPANCREATOGRAPHY (ERCP);  Surgeon: [name], MD;  Location: [place];  Service: Endoscopy;  Laterality: N/A;   ESOPHAGOGASTRODUODENOSCOPY (EGD) WITH PROPOFOL N/A 12/21/2017   Procedure: ESOPHAGOGASTRODUODENOSCOPY (EGD) WITH PROPOFOL;  Surgeon: [name], MD;  [facility];  Service: Endoscopy;  Laterality: N/A;   EYE SURGERY     FOOT SURGERY Left 2005   "fixed bone that stuck out in my ankle area"   HEMORRHOID BANDING     IMPLANTABLE CARDIOVERTER DEFIBRILLATOR IMPLANT  09/06/09   BSX dual chamber ICD implanted in [place] for cardiac arrest 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   [place] Right ~ [employer] N/A 11/25/2012   demonstrated EF 30%, inferior akinesis with mild hypokinesis of all walls, patent LAD and RCA stents; ostial PDA with 80-90% obstruction with medical therapy recommended   MOHS SURGERY  2008   nose, skin graft   POLYPECTOMY  12/21/2017   Procedure: POLYPECTOMY;  Surgeon: [name], MD;  [facility];  Service: Endoscopy;;   RETINAL DETACHMENT SURGERY Right 2013   TENOLYSIS Right 12/21/2013    Procedure: TENOLYSIS FLEXOR CARPI RADIALIS ,DEBRIDEMENT RIGHT JOINT WRIST,DEBRIDEMENT SCAPHOTRAPEZIAL TRAPEZOID, REPAIR OF EXTENSOR HOOD;  Surgeon: Daryll Brod, MD;  Location: [place];  Service: Orthopedics;  Laterality: Right;   TOE SURGERY Right 09/2019   3rd toe/hammer toe   V-TACH ABLATION  12/21/2014   VIDEO BRONCHOSCOPY Bilateral 01/09/2016   Procedure: VIDEO BRONCHOSCOPY WITHOUT FLUORO;  Surgeon: Juanito Doom, MD;  [facility];  Service: Cardiopulmonary;  Laterality: Bilateral;   Social History:   reports that he has quit smoking. His smoking use included cigarettes. He has a 55.00 pack-year smoking history. He has never used smokeless tobacco. He reports current alcohol use. He reports that he does not use drugs.  Family History  Problem Relation Age of Onset   Heart attack Brother    CAD Father    Hypertension Father    CAD Mother    Hypertension Mother    Hypertension Brother    Stroke Neg Hx     Medications: Patient's  Medications  New Prescriptions   No medications on file  Previous Medications   ALBUTEROL (PROVENTIL) (2.5 MG/3ML) 0.083% NEBULIZER SOLUTION    USE 1 VIAL VIA NEBULIZER EVERY 6 HOURS AS NEEDED FOR WHEEZING OR SHORTNESS OF BREATH   AMIODARONE (PACERONE) 200 MG TABLET    Take 1 tablet (200 mg total) by mouth daily.   APIXABAN (ELIQUIS) 5 MG TABS TABLET    Take 1 tablet (5 mg total) by mouth 2 (two) times daily.   ARTIFICIAL TEAR SOLUTION (SOOTHE XP OP)    Place 2 drops into both eyes daily as needed (dry eyes).   BENAZEPRIL (LOTENSIN) 10 MG TABLET    Take 1 tablet (10 mg total) by mouth daily.   BUDESONIDE-FORMOTEROL (SYMBICORT) 160-4.5 MCG/ACT INHALER    Inhale 2 puffs into the lungs 2 (two) times daily.   BUPROPION (WELLBUTRIN) 75 MG TABLET    Take 1 tablet (75 mg total) by mouth daily.   BUSPIRONE (BUSPAR) 15 MG TABLET    TAKE 1 TABLET BY MOUTH TWICE A DAY   CARVEDILOL (COREG) 3.125 MG TABLET    Take 1 tablet (3.125 mg  total) by mouth 2 (two) times daily.   CETIRIZINE (ZYRTEC) 10 MG TABLET    Take 1 tablet by mouth daily as needed.   CHOLINE FENOFIBRATE (FENOFIBRIC ACID) 135 MG CPDR    Take 1 tablet by mouth daily.   CYANOCOBALAMIN (VITAMIN B-12) 5000 MCG SUBL    Place under the tongue daily.   ESCITALOPRAM (LEXAPRO) 20 MG TABLET    Take 1 tablet (20 mg total) by mouth daily.   FINASTERIDE (PROSCAR) 5 MG TABLET    Take 5 mg by mouth daily.   FLUTICASONE (FLONASE) 50 MCG/ACT NASAL SPRAY    Place 2 sprays into both nostrils as needed.    FLUTICASONE (FLONASE) 50 MCG/ACT NASAL SPRAY    Place 2 sprays into both nostrils daily.   FUROSEMIDE (LASIX) 40 MG TABLET    Take 1/2 tablet by mouth daily.   GABAPENTIN (NEURONTIN) 300 MG CAPSULE    Take 1 capsule (300 mg total) by mouth 3 (three) times daily.   GUAIFENESIN (MUCINEX) 600 MG 12 HR TABLET    Take 1 tablet (600 mg total) by mouth 2 (two) times daily.   IPRATROPIUM-ALBUTEROL (COMBIVENT RESPIMAT) 20-100 MCG/ACT AERS RESPIMAT    Inhale 1 puff into the lungs every 6 (six) hours. Shortness of breath or wheezing   IRON PO    Take by mouth daily.    MAGNESIUM-OXIDE 400 (241.3 MG) MG TABLET    TAKE 1 TABSULE BY MOUTH DAILY   MEXILETINE (MEXITIL) 200 MG CAPSULE    TAKE 1 CAPSULE BY MOUTH TWICE A DAY   MULTIPLE VITAMINS-MINERALS (CENTRUM ADULTS PO)    Take by mouth daily.   NITROGLYCERIN (NITROSTAT) 0.4 MG SL TABLET    DISSOLVE 1 TABLET UNDER THE TONGUE EVERY 5 MINUTES FOR 3 DOSES AS NEEDED FOR CHEST PAIN   OMEPRAZOLE (PRILOSEC) 20 MG CAPSULE    Take 20 mg by mouth daily.    POTASSIUM CHLORIDE SA (KLOR-CON M20) 20 MEQ TABLET    TAKE 1 TABLET BY MOUTH EVERY DAY   RESPIRATORY THERAPY SUPPLIES (FLUTTER) DEVI    Use as directed   SENNOSIDES-DOCUSATE SODIUM (STOOL SOFTENER/LAXATIVE PO)    Take 2 tablets by mouth daily.   SPACER/AERO-HOLDING CHAMBERS (OPTICHAMBER DIAMOND) MISC    optichamber VHC   TAMSULOSIN (FLOMAX) 0.4 MG CAPS CAPSULE    Take one capsule by  mouth once daily  after supper.   TIOTROPIUM BROMIDE MONOHYDRATE (SPIRIVA RESPIMAT) 2.5 MCG/ACT AERS    Inhale 2 puffs into the lungs daily.  Modified Medications   No medications on file  Discontinued Medications   No medications on file    Physical Exam:  Vitals:   02/24/21 1314  BP: 120/80  Pulse: 75  Resp: 16  Temp: (!) 97.5 F (36.4 C)  SpO2: 91%  Weight: 206 lb 6.4 oz (93.6 kg)  Height: 6' (1.829 m)   Body mass index is 27.99 kg/m. Wt Readings from Last 3 Encounters:  02/24/21 206 lb 6.4 oz (93.6 kg)  02/13/21 207 lb 6.4 oz (94.1 kg)  01/02/21 206 lb (93.4 kg)    Physical Exam Vitals reviewed.  Constitutional:      General: He is not in acute distress. HENT:     Head: Normocephalic.  Eyes:     General:        Right eye: No discharge.        Left eye: No discharge.  Neck:     Vascular: No carotid bruit.  Cardiovascular:     Rate and Rhythm: Normal rate. Rhythm irregular.     Pulses: Normal pulses.     Heart sounds: Normal heart sounds. No murmur heard. Pulmonary:     Effort: Pulmonary effort is normal. No respiratory distress.     Breath sounds: Normal breath sounds. No wheezing.  Abdominal:     General: Bowel sounds are normal. There is no distension.     Palpations: Abdomen is soft.     Tenderness: There is no abdominal tenderness.  Musculoskeletal:     Cervical back: Normal range of motion.     Right lower leg: Edema present.     Comments: LLE warm to touch, swollen, skin appears red, scattered scabbing throughout extremity. Dorsal pedis +1.   Lymphadenopathy:     Cervical: No cervical adenopathy.  Skin:    General: Skin is warm and dry.     Capillary Refill: Capillary refill takes less than 2 seconds.  Neurological:     General: No focal deficit present.     Mental Status: He is alert and oriented to person, place, and time.     Motor: Weakness present.     Gait: Gait abnormal.     Comments: cane  Psychiatric:        Mood and Affect: Mood normal.         Behavior: Behavior normal.    Labs reviewed: Basic Metabolic Panel: Recent Labs    06/07/20 1434 07/07/20 0554 11/14/20 1502  NA 142 136 138  K 4.4 3.7 4.9  CL 102 102 103  CO2 23 26 28   GLUCOSE 90 102* 83  BUN 11 21 13   CREATININE 1.08 1.11 1.04  CALCIUM 9.5 9.2 10.0  TSH 2.290  --   --    Liver Function Tests: Recent Labs    03/01/20 1344 06/07/20 1434 11/14/20 1502  AST 34 61* 46*  ALT 45 67* 51*  ALKPHOS  --  54  --   BILITOT 0.6 0.8 0.8  PROT 6.4 6.6 6.4  ALBUMIN  --  4.3  --    No results for input(s): LIPASE, AMYLASE in the last 8760 hours. No results for input(s): AMMONIA in the last 8760 hours. CBC: Recent Labs    07/07/20 0554 11/14/20 1502 12/20/20 1410  WBC 4.2 4.5 3.3*  NEUTROABS 2.4 2,907 2,056  HGB 11.9* 12.9* 12.0*  HCT 36.2* 38.1* 36.9*  MCV 102.5* 100.5* 102.2*  PLT 167 119* 158   Lipid Panel: Recent Labs    03/01/20 1344  CHOL 136  HDL 54  LDLCALC 67  TRIG 71  CHOLHDL 2.5   TSH: Recent Labs    06/07/20 1434  TSH 2.290   A1C: Lab Results  Component Value Date   HGBA1C 5.6 12/29/2016     Assessment/Plan: 1. Right leg swelling - RLE present for warmth, erythema and swollen - suspect cellulitis versus DVT - 09/26 diagnosed with RLE cellulitis - doxycycline (VIBRA-TABS) 100 MG tablet; Take 1 tablet (100 mg total) by mouth 2 (two) times daily for 14 days.  Dispense: 28 tablet; Refill: 0 - VAS [place] LOWER EXTREMITY VENOUS (DVT); Future - consider ID consult due to reoccurrence  2. Dizziness - intermittent - ambulating with walker/cane - cont falls safety precautions   3. Mucopurulent chronic bronchitis ([place]) - denies congestion today - cont mucinex  4. COPD mixed type - scheduled to see pulmonary next week- Dr. Chriss Czar [facility] - lung sounds clear today, sats > 90% - reports intermittent sob  - cont albuterol prn, Symbicort, Spiriva, and Trilogy  5. Thrombocytopenia - platelets 158 12/20/2020, was 119 -  recheck cbc/diff- future - consider hematology consult if continues to be low  Total time: 31 minutes. Greater than 50% of total time spent doing patient education regarding symptom/medication management for leg swelling.    Next appt: none [place], [facility] [phone]

## 2021-02-25 NOTE — Progress Notes (Signed)
Remote ICD transmission.   

## 2021-02-27 ENCOUNTER — Other Ambulatory Visit: Payer: Self-pay

## 2021-02-27 ENCOUNTER — Ambulatory Visit (HOSPITAL_COMMUNITY)
Admission: RE | Admit: 2021-02-27 | Discharge: 2021-02-27 | Disposition: A | Discharge status: Home / Self Care | Payer: Medicare Other | Source: Ambulatory Visit | Attending: Cardiology | Admitting: Cardiology

## 2021-02-27 ENCOUNTER — Encounter: Payer: Self-pay | Admitting: Interventional Cardiology

## 2021-02-27 DIAGNOSIS — M7989 Other specified soft tissue disorders: Secondary | ICD-10-CM | POA: Insufficient documentation

## 2021-02-28 ENCOUNTER — Telehealth: Payer: Self-pay | Admitting: Interventional Cardiology

## 2021-02-28 ENCOUNTER — Encounter: Payer: Medicare Other | Admitting: Nurse Practitioner

## 2021-02-28 NOTE — Telephone Encounter (Signed)
Pt c/o swelling: STAT is pt has developed SOB within 24 hours  How much weight have you gained and in what time span? No weight gain  If swelling, where is the swelling located? RIGHT LEG PAIN/ RIGHT LEG IS HOT AND RED  Are you currently taking a fluid pill? YES  Are you currently SOB? NO  Do you have a log of your daily weights (if so, list)? PT SAID HE KNOWS WHEN HE GAINS WEIGHT AND HE IS NOT GAINING ANY WEIGHT  Have you gained 3 pounds in a day or 5 pounds in a week?   Have you traveled recently? NO  PT WANTS TO KNOW IF HE SHOULD GO TO THE ER OR IS THIS COMING FROM HIS HEART. PT ALSO SENT A MESSAGE IN [facility]

## 2021-02-28 NOTE — Telephone Encounter (Signed)
Pt has seen PCP this week about swelling in his right leg.  States right upper calf measures 16 inches and left is 13 inches.  Tried taking extra dose of Furosemide and it did not help.  Leg is red, swollen and warm to touch.  States PCP started him on Doxycycline on Monday for cellulitis but no improvement.  Explain to pt that it can take several days before he starts seeing a difference as the antibiotic builds up in his system.  Had DVT study yesterday that was negative.  Tried wearing compression stocking with no improvement either.  Denies SOB, CP or weight gain.  Advised pt to continue Doxycycline as prescribed and f/u with PCP if no improvement.

## 2021-03-06 ENCOUNTER — Ambulatory Visit (INDEPENDENT_AMBULATORY_CARE_PROVIDER_SITE_OTHER): Payer: Medicare Other | Admitting: Orthopedic Surgery

## 2021-03-06 ENCOUNTER — Other Ambulatory Visit: Payer: Self-pay

## 2021-03-06 ENCOUNTER — Encounter: Payer: Self-pay | Admitting: Orthopedic Surgery

## 2021-03-06 DIAGNOSIS — Z0000 Encounter for general adult medical examination without abnormal findings: Secondary | ICD-10-CM

## 2021-03-06 NOTE — Progress Notes (Signed)
Subjective:   Steven Guzman is a 80 y.o. male who presents for Medicare Annual/Subsequent preventive examination.  Review of Systems     Cardiac Risk Factors include: advanced age (>10men, >54 women);sedentary lifestyle;smoking/ tobacco exposure;hypertension     Objective:    There were no vitals filed for this visit. There is no height or weight on file to calculate BMI.  Advanced Directives 03/06/2021 02/24/2021 07/16/2020 07/07/2020 05/10/2020 03/01/2020 02/28/2020  Does Patient Have a Medical Advance Directive? No No No No No No No  Does patient want to make changes to medical advance directive? - - - - - - -  Would patient like information on creating a medical advance directive? No - Patient declined No - Patient declined No - Patient declined No - Patient declined - No - Patient declined No - Patient declined    Current Medications (verified) Outpatient Encounter Medications as of 03/06/2021  Medication Sig   albuterol (PROVENTIL) (2.5 MG/3ML) 0.083% nebulizer solution USE 1 VIAL VIA NEBULIZER EVERY 6 HOURS AS NEEDED FOR WHEEZING OR SHORTNESS OF BREATH   amiodarone (PACERONE) 200 MG tablet Take 1 tablet (200 mg total) by mouth daily.   apixaban (ELIQUIS) 5 MG TABS tablet Take 1 tablet (5 mg total) by mouth 2 (two) times daily.   Artificial Tear Solution (SOOTHE XP OP) Place 2 drops into both eyes daily as needed (dry eyes).   benazepril (LOTENSIN) 10 MG tablet Take 1 tablet (10 mg total) by mouth daily.   budesonide-formoterol (SYMBICORT) 160-4.5 MCG/ACT inhaler Inhale 2 puffs into the lungs 2 (two) times daily.   buPROPion (WELLBUTRIN) 75 MG tablet Take 1 tablet (75 mg total) by mouth daily.   busPIRone (BUSPAR) 15 MG tablet TAKE 1 TABLET BY MOUTH TWICE A DAY   carvedilol (COREG) 3.125 MG tablet Take 1 tablet (3.125 mg total) by mouth 2 (two) times daily.   cetirizine (ZYRTEC) 10 MG tablet Take 1 tablet by mouth daily as needed.   Choline Fenofibrate (FENOFIBRIC ACID) 135 MG  CPDR Take 1 tablet by mouth daily.   Cyanocobalamin (VITAMIN B-12) 5000 MCG SUBL Place under the tongue daily.   doxycycline (VIBRA-TABS) 100 MG tablet Take 1 tablet (100 mg total) by mouth 2 (two) times daily for 14 days.   escitalopram (LEXAPRO) 20 MG tablet Take 1 tablet (20 mg total) by mouth daily.   finasteride (PROSCAR) 5 MG tablet Take 5 mg by mouth daily.   fluticasone (FLONASE) 50 MCG/ACT nasal spray Place 2 sprays into both nostrils daily.   furosemide (LASIX) 40 MG tablet Take 1/2 tablet by mouth daily.   gabapentin (NEURONTIN) 300 MG capsule Take 1 capsule (300 mg total) by mouth 3 (three) times daily.   guaiFENesin (MUCINEX) 600 MG 12 hr tablet Take 1 tablet (600 mg total) by mouth 2 (two) times daily.   Ipratropium-Albuterol (COMBIVENT RESPIMAT) 20-100 MCG/ACT AERS respimat Inhale 1 puff into the lungs every 6 (six) hours. Shortness of breath or wheezing   IRON PO Take by mouth daily.    MAGNESIUM-OXIDE 400 (241.3 Mg) MG tablet TAKE 1 TABSULE BY MOUTH DAILY   mexiletine (MEXITIL) 200 MG capsule TAKE 1 CAPSULE BY MOUTH TWICE A DAY   Multiple Vitamins-Minerals (CENTRUM ADULTS PO) Take by mouth daily.   nitroGLYCERIN (NITROSTAT) 0.4 MG SL tablet DISSOLVE 1 TABLET UNDER THE TONGUE EVERY 5 MINUTES FOR 3 DOSES AS NEEDED FOR CHEST PAIN   omeprazole (PRILOSEC) 20 MG capsule Take 20 mg by mouth daily.  potassium chloride SA (KLOR-CON M20) 20 MEQ tablet TAKE 1 TABLET BY MOUTH EVERY DAY   Respiratory Therapy Supplies (FLUTTER) DEVI Use as directed   Sennosides-Docusate Sodium (STOOL SOFTENER/LAXATIVE PO) Take 2 tablets by mouth daily.   Spacer/Aero-Holding Chambers (OPTICHAMBER DIAMOND) MISC optichamber VHC   tamsulosin (FLOMAX) 0.4 MG CAPS capsule Take one capsule by mouth once daily after supper.   Tiotropium Bromide Monohydrate (SPIRIVA RESPIMAT) 2.5 MCG/ACT AERS Inhale 2 puffs into the lungs daily.   [DISCONTINUED] fluticasone (FLONASE) 50 MCG/ACT nasal spray Place 2 sprays into both  nostrils as needed.    No facility-administered encounter medications on file as of 03/06/2021.    Allergies (verified) Sulfa antibiotics   History: Past Medical History:  Diagnosis Date   AICD (automatic cardioverter/defibrillator) present    Aneurysm ([place])    a. Aneurysmal infrarenal aorta up to 33 mm on CT 10/2014, recommended f/u due 10/2017   Anginal pain ([place])    Anxiety    Basal cell carcinoma of nose    S/P MOHS   Biliary acute pancreatitis    CAD (coronary artery disease)    a. s/p MI in 1994 with PCI to LAD at that time b. cath 10/2012 demonstrated EF 30%, inferior akinesis with mild hypokinesis of all walls, patent LAD and RCA stents; ostial PDA with 80-90% obstruction with medical therapy recommended    Chronic systolic CHF (congestive heart failure) (HCC)    EF 30 to 35 % as of 09/2014.    CKD (chronic kidney disease), stage III ([place])    Complication of anesthesia 10/2014   "had to have defibrillator w/ERCP"   COPD (chronic obstructive pulmonary disease) ([place])    a. followed by pulmonary, COPD GOLD stage II   Depression    Diverticulosis of colon 07/2014   noted on CT   GERD (gastroesophageal reflux disease)    Hiatal hernia    Hyperglycemia 10/2012.   Hyperlipidemia    Hypertension    Myocardial infarction [facility]) 1994; 2011   Pneumonia 1946; 2015   Prostate enlargement 07/2014   observed on CT   Tobacco abuse    Ventricular tachycardia    a. 08/2009 s/p BSX E110 Teligen 100 AICD, ser#: [id number];  b. 08/2008 VT req ATP - detection reprogrammed from 160 to 150. c. EPS and VT ablation by Dr. Lovena Le 12/21/2014   Past Surgical History:  Procedure Laterality Date   BIOPSY  12/21/2017   Procedure: BIOPSY;  Surgeon: [name], MD;  Location: [facility];  Service: Endoscopy;;   CATARACT EXTRACTION W/ INTRAOCULAR LENS  IMPLANT, BILATERAL Bilateral ~ 2011   COLONOSCOPY     COLONOSCOPY WITH PROPOFOL N/A 12/21/2017   Procedure: COLONOSCOPY WITH PROPOFOL;  Surgeon: [name], MD;  Location: [facility];  Service: Endoscopy;  Laterality: N/A;   ELECTROPHYSIOLOGIC STUDY N/A 12/21/2014   Procedure: V Tach Ablation;  Surgeon: [name], MD;  Location: [facility];  Service: Cardiovascular;  Laterality: N/A;   ERCP N/A 11/16/2014   Procedure: ENDOSCOPIC RETROGRADE CHOLANGIOPANCREATOGRAPHY (ERCP);  Surgeon: [name], MD;  Location: [place];  Service: Endoscopy;  Laterality: N/A;   ESOPHAGOGASTRODUODENOSCOPY (EGD) WITH PROPOFOL N/A 12/21/2017   Procedure: ESOPHAGOGASTRODUODENOSCOPY (EGD) WITH PROPOFOL;  Surgeon: [name], MD;  [facility];  Service: Endoscopy;  Laterality: N/A;   EYE SURGERY     FOOT SURGERY Left 2005   "fixed bone that stuck out in my ankle area"   HEMORRHOID BANDING  IMPLANTABLE CARDIOVERTER DEFIBRILLATOR IMPLANT  09/06/09   BSX dual chamber ICD implanted in [place] for cardiac arrest 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   [place] Right ~ [place] N/A 11/25/2012   demonstrated EF 30%, inferior akinesis with mild hypokinesis of all walls, patent LAD and RCA stents; ostial PDA with 80-90% obstruction with medical therapy recommended   MOHS SURGERY  2008   nose, skin graft   POLYPECTOMY  12/21/2017   Procedure: POLYPECTOMY;  Surgeon: [name], MD;  [facility];  Service: Endoscopy;;   RETINAL DETACHMENT SURGERY Right 2013   TENOLYSIS Right 12/21/2013   Procedure: TENOLYSIS FLEXOR CARPI RADIALIS ,DEBRIDEMENT RIGHT JOINT WRIST,DEBRIDEMENT SCAPHOTRAPEZIAL TRAPEZOID, REPAIR OF EXTENSOR HOOD;  Surgeon: Daryll Brod, MD;  Location: [place];  Service: Orthopedics;  Laterality: Right;   TOE SURGERY Right 09/2019   3rd toe/hammer toe   V-TACH ABLATION  12/21/2014   VIDEO BRONCHOSCOPY Bilateral 01/09/2016   Procedure: VIDEO BRONCHOSCOPY WITHOUT FLUORO;  Surgeon: Juanito Doom, MD;  [facility];  Service: Cardiopulmonary;   Laterality: Bilateral;   Family History  Problem Relation Age of Onset   Heart attack Brother    CAD Father    Hypertension Father    CAD Mother    Hypertension Mother    Hypertension Brother    Stroke Neg Hx    Social History   Socioeconomic History   Marital status: Married    Spouse name: Not on file   Number of children: Not on file   Years of education: Not on file   Highest education level: Not on file  Occupational History   Occupation: Retired  Tobacco Use   Smoking status: Former    Packs/day: 1.00    Years: 55.00    Pack years: 55.00    Types: Cigarettes   Smokeless tobacco: Never   Tobacco comments:    off/on, always ready to quit but does not work out  Scientific laboratory technician Use: Never used  Substance and Sexual Activity   Alcohol use: Yes    Alcohol/week: 0.0 standard drinks    Comment: occ   Drug use: No   Sexual activity: Not Currently  Other Topics Concern   Not on file  Social History Narrative   Not on file   Social Determinants of Health   Financial Resource Strain: Medium Risk   Difficulty of Paying Living Expenses: Somewhat hard  Food Insecurity: Food Insecurity Present   Worried About Running Out of Food in the Last Year: Sometimes true   Ran Out of Food in the Last Year: Never true  Transportation Needs: No Transportation Needs   Lack of Transportation (Medical): No   Lack of Transportation (Non-Medical): No  Physical Activity: Insufficiently Active   Days of Exercise per Week: 4 days   Minutes of Exercise per Session: 20 min  Stress: Stress Concern Present   Feeling of Stress : Rather much  Social Connections: Moderately Isolated   Frequency of Communication with Friends and Family: More than three times a week   Frequency of Social Gatherings with Friends and Family: Once a week   Attends Religious Services: Never   Marine scientist or Organizations: No   Attends Archivist Meetings: Never   Marital Status: Married     Tobacco Counseling Counseling given: Not Answered Tobacco comments: off/on, always ready to quit but does not work out   Clinical  Intake:  Pre-visit preparation completed: Yes  Pain : No/denies pain     BMI - recorded: 27.9 Nutritional Status: BMI 25 -29 Overweight Nutritional Risks: None Diabetes: No  How often do you need to have someone help you when you read instructions, pamphlets, or other written materials from your doctor or pharmacy?: 1 - Never What is the last grade level you completed in school?: 4 years college  Diabetic?No  Interpreter Needed?: No  Information entered by :: [place] of Daily Living In your present state of health, do you have any difficulty performing the following activities: 03/06/2021 03/06/2021  Hearing? N N  Comment - Reports wife concerns about hearing and going to see a [place] doctor for hearing testing.  Vision? N N  Comment - Wear glasses  Difficulty concentrating or making decisions? Tempie Donning  Walking or climbing stairs? N N  Comment - Use a cane to walk  Dressing or bathing? N N  Doing errands, shopping? N N  Preparing Food and eating ? N N  Using the Toilet? N N  In the past six months, have you accidently leaked urine? Y N  Do you have problems with loss of bowel control? N N  Managing your Medications? N N  Managing your Finances? N N  Housekeeping or managing your Housekeeping? N N  Some recent data might be hidden    Patient Care Team: Yvonna Alanis, NP as PCP - General (Adult Health Nurse Practitioner) Belva Crome, MD as PCP - Cardiology (Cardiology) [name], MD as PCP - Electrophysiology (Cardiology) Roel Cluck, MD as Referring Physician (Ophthalmology) Harriett Sine, MD as Consulting Physician (Dermatology)  Indicate any recent Medical Services you may have received from other than [facility] providers in the past year (date may be approximate).     Assessment:   This is a  routine wellness examination for Mancel.  Hearing/Vision screen Hearing Screening - Comments:: Reports wife concerns about hearing and going to see a [place] doctor for hearing testing. Vision Screening - Comments:: Wear eye glasses.  Dietary issues and exercise activities discussed: Current Exercise Habits: The patient does not participate in regular exercise at present, Exercise limited by: cardiac condition(s);respiratory conditions(s)   Goals Addressed             This Visit's Progress    Maintain Mobility and Function   On track    Evidence-based guidance:  Emphasize the importance of physical activity and aerobic exercise as included in treatment plan; assess barriers to adherence; consider patient's abilities and preferences.  Encourage gradual increase in activity or exercise instead of stopping if pain occurs.  Reinforce individual therapy exercise prescription, such as strengthening, stabilization and stretching programs.  Promote optimal body mechanics to stabilize the spine with lifting and functional activity.  Encourage activity and mobility modifications to facilitate optimal function, such as using a log roll for bed mobility or dressing from a seated position.  Reinforce individual adaptive equipment recommendations to limit excessive spinal movements, such as a Systems analyst.  Assess adequacy of sleep; encourage use of sleep hygiene techniques, such as bedtime routine; use of white noise; dark, cool bedroom; avoiding daytime naps, heavy meals or exercise before bedtime.  Promote positions and modification to optimize sleep and sexual activity; consider pillows or positioning devices to assist in maintaining neutral spine.  Explore options for applying ergonomic principles at work and home, such as frequent position changes, using ergonomically designed equipment and working  at optimal height.  Promote modifications to increase comfort with driving such as lumbar  support, optimizing seat and steering wheel position, using cruise control and taking frequent rest stops to stretch and walk.   Notes:      Quit Smoking   On track      Depression Screen PHQ 2/9 Scores 03/06/2021 03/06/2021 03/06/2021 02/28/2020 02/05/2020 03/16/2019 02/27/2019  PHQ - 2 Score 0 0 0 0 0 0 0    Fall Risk Fall Risk  03/06/2021 03/06/2021 02/24/2021 11/14/2020 09/23/2020  Falls in the past year? 0 0 0 0 0  Comment - - - - -  Number falls in past yr: 0 0 0 0 0  Comment - - - - -  Injury with Fall? 0 0 0 0 0  Comment - - - - -  Risk for fall due to : History of fall(s);Orthopedic patient;Impaired balance/gait History of fall(s) No Fall Risks No Fall Risks No Fall Risks  Follow up Falls evaluation completed Education provided;Falls prevention discussed;Falls evaluation completed Falls evaluation completed Falls evaluation completed;Education provided;Falls prevention discussed Falls evaluation completed    FALL RISK PREVENTION PERTAINING TO THE HOME:  Any stairs in or around the home? No  If so, are there any without handrails? Yes  Home free of loose throw rugs in walkways, pet beds, electrical cords, etc? No  Adequate lighting in your home to reduce risk of falls? Yes   ASSISTIVE DEVICES UTILIZED TO PREVENT FALLS:  Life alert? No  Use of a cane, walker or w/c? Yes  Grab bars in the bathroom? No  Shower chair or bench in shower? No  Elevated toilet seat or a handicapped toilet? No   TIMED UP AND GO:  Was the test performed? No .  Length of time to ambulate 10 feet:  sec.   Gait slow and steady with assistive device  Cognitive Function: MMSE - Mini Mental State Exam 02/27/2019  Orientation to time 3  Orientation to Place 5  Registration 3  Attention/ Calculation 5  Recall 3  Language- name 2 objects 2  Language- repeat 1  Language- follow 3 step command 3  Language- read & follow direction 1  Write a sentence 1  Copy design 1  Total score 28     6CIT  Screen 03/06/2021 02/28/2020  What Year? 0 points 0 points  What month? 0 points 0 points  What time? 0 points 0 points  Count back from 20 0 points 0 points  Months in reverse 0 points 0 points  Repeat phrase 0 points 0 points  Total Score 0 0    Immunizations Immunization History  Administered Date(s) Administered   Fluad Quad(high Dose 65+) 12/14/2018   Influenza, High Dose Seasonal PF 12/17/2012, 01/06/2016, 12/21/2016, 12/18/2019   Influenza,inj,Quad PF,6+ Mos 12/25/2013, 12/22/2014   Influenza,trivalent, recombinat, inj, PF 12/16/2017   Influenza-Unspecified 12/28/2017, 12/18/2019   PFIZER(Purple Top)SARS-COV-2 Vaccination 04/19/2019, 05/10/2019, 01/17/2020, 08/25/2020   Pneumococcal Conjugate-13 05/11/2014   Pneumococcal-Unspecified 10/17/2010   Tdap 05/11/2014   Zoster, Live 03/30/2008    TDAP status: Up to date  Flu vaccine: not up to date  Pneumococcal vaccine status: Up to date  Covid-19 vaccine status: Completed vaccines  Qualifies for Shingles Vaccine? Yes   Zostavax completed Yes   Shingrix Completed?: No.    Education has been provided regarding the importance of this vaccine. Patient has been advised to call insurance company to determine out of pocket expense if they have not yet received this  vaccine. Advised may also receive vaccine at local pharmacy or Health Dept. Verbalized acceptance and understanding.  Screening Tests Health Maintenance  Topic Date Due   Zoster Vaccines- Shingrix (1 of 2) Never done   Pneumonia Vaccine 37+ Years old (2 - PPSV23 if available, else PCV20) 05/12/2015   COVID-19 Vaccine (5 - Booster for Pfizer series) 10/20/2020   INFLUENZA VACCINE  10/28/2020   TETANUS/TDAP  05/11/2024   HPV VACCINES  Aged Out    Health Maintenance  Health Maintenance Due  Topic Date Due   Zoster Vaccines- Shingrix (1 of 2) Never done   Pneumonia Vaccine 37+ Years old (2 - PPSV23 if available, else PCV20) 05/12/2015   COVID-19 Vaccine (5 -  Booster for Pfizer series) 10/20/2020   INFLUENZA VACCINE  10/28/2020    Colorectal cancer screening: No longer required.    Lung Cancer Screening: (Low Dose CT Chest recommended if Age 50-80 years, 30 pack-year currently smoking OR have quit w/in 15years.) does not qualify.   Lung Cancer Screening Referral: yes  Additional Screening:  Hepatitis C Screening: does not qualify; Completed   Vision Screening: Recommended annual ophthalmology exams for early detection of glaucoma and other disorders of the eye. Is the patient up to date with their annual eye exam?  Yes  Who is the provider or what is the name of the office in which the patient attends annual eye exams?[facility] If pt is not established with a provider, would they like to be referred to a provider to establish care? No .   Dental Screening: Recommended annual dental exams for proper oral hygiene  Community Resource Referral / Chronic Care Management: CRR required this visit?  No   CCM required this visit?  No      Plan:     I have personally reviewed and noted the following in the patient's chart:   Medical and social history Use of alcohol, tobacco or illicit drugs  Current medications and supplements including opioid prescriptions. Patient is not currently taking opioid prescriptions. Functional ability and status Nutritional status Physical activity Advanced directives List of other physicians Hospitalizations, surgeries, and ER visits in previous 12 months Vitals Screenings to include cognitive, depression, and falls Referrals and appointments  In addition, I have reviewed and discussed with patient certain preventive protocols, quality metrics, and best practice recommendations. A written personalized care plan for preventive services as well as general preventive health recommendations were provided to patient.     Yvonna Alanis, NP   03/06/2021

## 2021-03-06 NOTE — Patient Instructions (Signed)
Steven Guzman , Thank you for taking time to come for your Medicare Wellness Visit. I appreciate your ongoing commitment to your health goals. Please review the following plan we discussed and let me know if I can assist you in the future.   Screening recommendations/referrals: Recommended yearly ophthalmology/optometry visit for glaucoma screening and checkup Recommended yearly dental visit for hygiene and checkup  Vaccinations: Influenza vaccine - please get Pneumococcal vaccine - up to date Tdap vaccine - up to date Shingles vaccine - up to date COVID vaccine - up to date  Advanced directives: completed  Conditions/risks identified:    Preventive Care 40-64 Years, Male Preventive care refers to lifestyle choices and visits with your health care provider that can promote health and wellness. What does preventive care include? A yearly physical exam. This is also called an annual well check. Dental exams once or twice a year. Routine eye exams. Ask your health care provider how often you should have your eyes checked. Personal lifestyle choices, including: Daily care of your teeth and gums. Regular physical activity. Eating a healthy diet. Avoiding tobacco and drug use. Limiting alcohol use. Practicing safe sex. Taking low-dose aspirin daily starting at age 66. Taking vitamin and mineral supplements as recommended by your health care provider. What happens during an annual well check? The services and screenings done by your health care provider during your annual well check will depend on your age, overall health, lifestyle risk factors, and family history of disease. Counseling  Your health care provider may ask you questions about your: Alcohol use. Tobacco use. Drug use. Emotional well-being. Home and relationship well-being. Sexual activity. Eating habits. Work and work Statistician. Method of birth control. Menstrual cycle. Pregnancy history. Screening  You may  have the following tests or measurements: Height, weight, and BMI. Blood pressure. Lipid and cholesterol levels. These may be checked every 5 years, or more frequently if you are over 16 years old. Skin check. Lung cancer screening. You may have this screening every year starting at age 7 if you have a 30-pack-year history of smoking and currently smoke or have quit within the past 15 years. Fecal occult blood test (FOBT) of the stool. You may have this test every year starting at age 36. Flexible sigmoidoscopy or colonoscopy. You may have a sigmoidoscopy every 5 years or a colonoscopy every 10 years starting at age 66. Hepatitis C blood test. Hepatitis B blood test. Sexually transmitted disease (STD) testing. Diabetes screening. This is done by checking your blood sugar (glucose) after you have not eaten for a while (fasting). You may have this done every 1-3 years. Mammogram. This may be done every 1-2 years. Talk to your health care provider about when you should start having regular mammograms. This may depend on whether you have a family history of breast cancer. BRCA-related cancer screening. This may be done if you have a family history of breast, ovarian, tubal, or peritoneal cancers. Pelvic exam and Pap test. This may be done every 3 years starting at age 9. Starting at age 88, this may be done every 5 years if you have a Pap test in combination with an HPV test. Bone density scan. This is done to screen for osteoporosis. You may have this scan if you are at high risk for osteoporosis. Discuss your test results, treatment options, and if necessary, the need for more tests with your health care provider. Vaccines  Your health care provider may recommend certain vaccines, such as: Influenza vaccine.  This is recommended every year. Tetanus, diphtheria, and acellular pertussis (Tdap, Td) vaccine. You may need a Td booster every 10 years. Zoster vaccine. You may need this after age  58. Pneumococcal 13-valent conjugate (PCV13) vaccine. You may need this if you have certain conditions and were not previously vaccinated. Pneumococcal polysaccharide (PPSV23) vaccine. You may need one or two doses if you smoke cigarettes or if you have certain conditions. Talk to your health care provider about which screenings and vaccines you need and how often you need them. This information is not intended to replace advice given to you by your health care provider. Make sure you discuss any questions you have with your health care provider. Document Released: 04/12/2015 Document Revised: 12/04/2015 Document Reviewed: 01/15/2015 Elsevier Interactive Patient Education  2017 [employer] Prevention in the Home Falls can cause injuries. They can happen to people of all ages. There are many things you can do to make your home safe and to help prevent falls. What can I do on the outside of my home? Regularly fix the edges of walkways and driveways and fix any cracks. Remove anything that might make you trip as you walk through a door, such as a raised step or threshold. Trim any bushes or trees on the path to your home. Use bright outdoor lighting. Clear any walking paths of anything that might make someone trip, such as rocks or tools. Regularly check to see if handrails are loose or broken. Make sure that both sides of any steps have handrails. Any raised decks and porches should have guardrails on the edges. Have any leaves, snow, or ice cleared regularly. Use sand or salt on walking paths during winter. Clean up any spills in your garage right away. This includes oil or grease spills. What can I do in the bathroom? Use night lights. Install grab bars by the toilet and in the tub and shower. Do not use towel bars as grab bars. Use non-skid mats or decals in the tub or shower. If you need to sit down in the shower, use a plastic, non-slip stool. Keep the floor dry. Clean up  any water that spills on the floor as soon as it happens. Remove soap buildup in the tub or shower regularly. Attach bath mats securely with double-sided non-slip rug tape. Do not have throw rugs and other things on the floor that can make you trip. What can I do in the bedroom? Use night lights. Make sure that you have a light by your bed that is easy to reach. Do not use any sheets or blankets that are too big for your bed. They should not hang down onto the floor. Have a firm chair that has side arms. You can use this for support while you get dressed. Do not have throw rugs and other things on the floor that can make you trip. What can I do in the kitchen? Clean up any spills right away. Avoid walking on wet floors. Keep items that you use a lot in easy-to-reach places. If you need to reach something above you, use a strong step stool that has a grab bar. Keep electrical cords out of the way. Do not use floor polish or wax that makes floors slippery. If you must use wax, use non-skid floor wax. Do not have throw rugs and other things on the floor that can make you trip. What can I do with my stairs? Do not leave any items on  the stairs. Make sure that there are handrails on both sides of the stairs and use them. Fix handrails that are broken or loose. Make sure that handrails are as long as the stairways. Check any carpeting to make sure that it is firmly attached to the stairs. Fix any carpet that is loose or worn. Avoid having throw rugs at the top or bottom of the stairs. If you do have throw rugs, attach them to the floor with carpet tape. Make sure that you have a light switch at the top of the stairs and the bottom of the stairs. If you do not have them, ask someone to add them for you. What else can I do to help prevent falls? Wear shoes that: Do not have high heels. Have rubber bottoms. Are comfortable and fit you well. Are closed at the toe. Do not wear sandals. If you use  a stepladder: Make sure that it is fully opened. Do not climb a closed stepladder. Make sure that both sides of the stepladder are locked into place. Ask someone to hold it for you, if possible. Clearly mark and make sure that you can see: Any grab bars or handrails. First and last steps. Where the edge of each step is. Use tools that help you move around (mobility aids) if they are needed. These include: Canes. Walkers. Scooters. Crutches. Turn on the lights when you go into a dark area. Replace any light bulbs as soon as they burn out. Set up your furniture so you have a clear path. Avoid moving your furniture around. If any of your floors are uneven, fix them. If there are any pets around you, be aware of where they are. Review your medicines with your doctor. Some medicines can make you feel dizzy. This can increase your chance of falling. Ask your doctor what other things that you can do to help prevent falls. This information is not intended to replace advice given to you by your health care provider. Make sure you discuss any questions you have with your health care provider. Document Released: 01/10/2009 Document Revised: 08/22/2015 Document Reviewed: 04/20/2014 Elsevier Interactive Patient Education  2017 [employer].

## 2021-03-06 NOTE — Progress Notes (Signed)
This service is provided via telemedicine  No vital signs collected/recorded due to the encounter was a telemedicine visit.   Location of patient (ex: home, work):  Home  Patient consents to a telephone visit:  Yes  Location of the provider (ex: office, home):  Office  Name of any referring provider:  Yvonna Alanis, NP   Names of all persons participating in the telemedicine service and their role in the encounter:  [name], [place]  Time spent on call:  11 minutes

## 2021-03-10 ENCOUNTER — Other Ambulatory Visit: Payer: Self-pay | Admitting: Interventional Cardiology

## 2021-03-17 ENCOUNTER — Encounter: Payer: Self-pay | Admitting: Interventional Cardiology

## 2021-03-17 MED ORDER — MEXILETINE HCL 200 MG PO CAPS: 200.0000 mg | ORAL_CAPSULE | Freq: Two times a day (BID) | ORAL | 1 refills | Status: DC

## 2021-03-19 NOTE — Progress Notes (Signed)
Cardiology Office Note    Date:  04/01/2021   ID:  Steven Guzman, Steven Guzman February 06, 1941, MRN [id number]   PCP:  Steven Alanis, NP   Steven Guzman  Cardiologist:  Steven Grooms, MD   Advanced Practice Provider:  No care team member to display Electrophysiologist:  Steven Peru, MD   [phone]   Chief Complaint  Patient presents with   Follow-up    History of Present Illness:  Steven Guzman is a 80 y.o. male  with a hx of ischemic cardiomyopathy with EF 40-45% on echo 2018, COPD, hypertension, recent low blood pressures, CAD with prior coronary bypass grafting, tobacco abuse, PAF, ventricular tachycardia controlled on amiodarone and Mexitil, and implantable cardiac defibrillator.  Last saw Dr. Tamala Guzman 07/10/20 and quit smoking.  Patient most recently has had trouble with LEE-treated for cellulitis by PCP. [place] negative for DVT. He increased lasix for 3 days which helped. Does watch his salt. No chest pain, chronic DOE from COPD. No regular exercise but does housework and walks his dog short distances. Has seen Dr. Henrene Guzman in past for elevated LFT's. 10/2020 AST 46 ALT 51.      Past Medical History:  Diagnosis Date   AICD (automatic cardioverter/defibrillator) present    Aneurysm ([place])    a. Aneurysmal infrarenal aorta up to 33 mm on CT 10/2014, recommended f/u due 10/2017   Anginal pain ([place])    Anxiety    Basal cell carcinoma of nose    S/P MOHS   Biliary acute pancreatitis    CAD (coronary artery disease)    a. s/p MI in 1994 with PCI to LAD at that time b. cath 10/2012 demonstrated EF 30%, inferior akinesis with mild hypokinesis of all walls, patent LAD and RCA stents; ostial PDA with 80-90% obstruction with medical therapy recommended    Chronic systolic CHF (congestive heart failure) (HCC)    EF 30 to 35 % as of 09/2014.    CKD (chronic kidney disease), stage III ([place])    Complication of anesthesia 10/2014   "had to have defibrillator w/ERCP"   COPD  (chronic obstructive pulmonary disease) ([place])    a. followed by pulmonary, COPD GOLD stage II   Depression    Diverticulosis of colon 07/2014   noted on CT   GERD (gastroesophageal reflux disease)    Hiatal hernia    Hyperglycemia 10/2012.   Hyperlipidemia    Hypertension    Myocardial infarction [facility]) 1994; 2011   Pneumonia 1946; 2015   Prostate enlargement 07/2014   observed on CT   Tobacco abuse    Ventricular tachycardia    a. 08/2009 s/p BSX E110 Teligen 100 AICD, ser#: [id number];  b. 08/2008 VT req ATP - detection reprogrammed from 160 to 150. c. EPS and VT ablation by Dr. Lovena Guzman 12/21/2014    Past Surgical History:  Procedure Laterality Date   BIOPSY  12/21/2017   Procedure: BIOPSY;  Surgeon: Steven Shipper, MD;  Location: [facility];  Service: Endoscopy;;   CATARACT EXTRACTION W/ INTRAOCULAR LENS  IMPLANT, BILATERAL Bilateral ~ 2011   COLONOSCOPY     COLONOSCOPY WITH PROPOFOL N/A 12/21/2017   Procedure: COLONOSCOPY WITH PROPOFOL;  Surgeon: Steven Shipper, MD;  Location: [facility];  Service: Endoscopy;  Laterality: N/A;   ELECTROPHYSIOLOGIC STUDY N/A 12/21/2014   Procedure: V Tach Ablation;  Surgeon: Steven Lance, MD;  Location: [facility];  Service: Cardiovascular;  Laterality: N/A;   ERCP  N/A 11/16/2014   Procedure: ENDOSCOPIC RETROGRADE CHOLANGIOPANCREATOGRAPHY (ERCP);  Surgeon: Steven Castle, MD;  Location: [facility];  Service: Endoscopy;  Laterality: N/A;   ESOPHAGOGASTRODUODENOSCOPY (EGD) WITH PROPOFOL N/A 12/21/2017   Procedure: ESOPHAGOGASTRODUODENOSCOPY (EGD) WITH PROPOFOL;  Surgeon: Steven Shipper, MD;  [facility];  Service: Endoscopy;  Laterality: N/A;   EYE SURGERY     FOOT SURGERY Left 2005   "fixed bone that stuck out in my ankle area"   HEMORRHOID BANDING     IMPLANTABLE CARDIOVERTER DEFIBRILLATOR IMPLANT  09/06/09   BSX dual chamber ICD implanted in [place] for cardiac arrest 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   [place] Right ~ [place] N/A 11/25/2012   demonstrated EF 30%, inferior akinesis with mild hypokinesis of all walls, patent LAD and RCA stents; ostial PDA with 80-90% obstruction with medical therapy recommended   MOHS SURGERY  2008   nose, skin graft   POLYPECTOMY  12/21/2017   Procedure: POLYPECTOMY;  Surgeon: Steven Shipper, MD;  [facility];  Service: Endoscopy;;   RETINAL DETACHMENT SURGERY Right 2013   TENOLYSIS Right 12/21/2013   Procedure: TENOLYSIS FLEXOR CARPI RADIALIS ,DEBRIDEMENT RIGHT JOINT WRIST,DEBRIDEMENT SCAPHOTRAPEZIAL TRAPEZOID, REPAIR OF EXTENSOR HOOD;  Surgeon: Steven Brod, MD;  Location: [facility];  Service: Orthopedics;  Laterality: Right;   TOE SURGERY Right 09/2019   3rd toe/hammer toe   V-TACH ABLATION  12/21/2014   VIDEO BRONCHOSCOPY Bilateral 01/09/2016   Procedure: VIDEO BRONCHOSCOPY WITHOUT FLUORO;  Surgeon: Steven Doom, MD;  [facility];  Service: Cardiopulmonary;  Laterality: Bilateral;    Current Medications: Current Meds  Medication Sig   albuterol (PROVENTIL) (2.5 MG/3ML) 0.083% nebulizer solution USE 1 VIAL VIA NEBULIZER EVERY 6 HOURS AS NEEDED FOR WHEEZING OR SHORTNESS OF BREATH   amiodarone (PACERONE) 200 MG tablet Take 1 tablet (200 mg total) by mouth daily.   AMOXICILLIN PO Take 1 tablet by mouth every 12 (twelve) hours.   apixaban (ELIQUIS) 5 MG TABS tablet Take 1 tablet (5 mg total) by mouth 2 (two) times daily.   Artificial Tear Solution (SOOTHE XP OP) Place 2 drops into both eyes daily as needed (dry eyes).   benazepril (LOTENSIN) 10 MG tablet Take 1 tablet (10 mg total) by mouth daily.   budesonide-formoterol (SYMBICORT) 160-4.5 MCG/ACT inhaler Inhale 2 puffs into the lungs 2 (two) times daily.   buPROPion (WELLBUTRIN) 75 MG tablet Take 1 tablet (75 mg total) by mouth daily.   busPIRone (BUSPAR) 15 MG tablet TAKE 1 TABLET BY MOUTH TWICE A DAY   carvedilol (COREG) 3.125 MG tablet  Take 1 tablet (3.125 mg total) by mouth 2 (two) times daily.   cetirizine (ZYRTEC) 10 MG tablet Take 1 tablet by mouth daily as needed.   Choline Fenofibrate (FENOFIBRIC ACID) 135 MG CPDR TAKE ONE CAPSULE BY MOUTH DAILY   Cyanocobalamin (VITAMIN B-12) 5000 MCG SUBL Place under the tongue daily.   escitalopram (LEXAPRO) 20 MG tablet Take 1 tablet (20 mg total) by mouth daily.   finasteride (PROSCAR) 5 MG tablet Take 5 mg by mouth daily.   fluticasone (FLONASE) 50 MCG/ACT nasal spray Place 2 sprays into both nostrils daily.   Fluticasone-Umeclidin-Vilant (TRELEGY ELLIPTA) 200-62.5-25 MCG/ACT AEPB Inhale 1 puff into the lungs daily.   gabapentin (NEURONTIN) 300 MG capsule Take 1 capsule (300 mg total) by mouth 3 (three) times daily.   Ipratropium-Albuterol (COMBIVENT RESPIMAT) 20-100 MCG/ACT AERS respimat Inhale 1  puff into the lungs every 6 (six) hours. Shortness of breath or wheezing   IRON PO Take by mouth daily.    MAGNESIUM-OXIDE 400 (241.3 Mg) MG tablet TAKE 1 TABSULE BY MOUTH DAILY   methocarbamol (ROBAXIN) 500 MG tablet Take 1 tablet (500 mg total) by mouth every 12 (twelve) hours as needed for up to 5 days for muscle spasms.   mexiletine (MEXITIL) 200 MG capsule Take 1 capsule (200 mg total) by mouth 2 (two) times daily.   Multiple Vitamins-Minerals (CENTRUM ADULTS PO) Take by mouth daily.   nitroGLYCERIN (NITROSTAT) 0.4 MG SL tablet DISSOLVE 1 TABLET UNDER THE TONGUE EVERY 5 MINUTES FOR 3 DOSES AS NEEDED FOR CHEST PAIN   omeprazole (PRILOSEC) 20 MG capsule Take 20 mg by mouth daily.    Respiratory Therapy Supplies (FLUTTER) DEVI Use as directed   Sennosides-Docusate Sodium (STOOL SOFTENER/LAXATIVE PO) Take 2 tablets by mouth daily.   Spacer/Aero-Holding Chambers (OPTICHAMBER DIAMOND) MISC optichamber VHC   tamsulosin (FLOMAX) 0.4 MG CAPS capsule Take one capsule by mouth once daily after supper.   Tiotropium Bromide Monohydrate (SPIRIVA RESPIMAT) 2.5 MCG/ACT AERS Inhale 2 puffs into the  lungs daily.   [DISCONTINUED] furosemide (LASIX) 40 MG tablet Take 1/2 tablet by mouth daily.   [DISCONTINUED] potassium chloride SA (KLOR-CON M20) 20 MEQ tablet TAKE 1 TABLET BY MOUTH EVERY DAY     Allergies:   Sulfa antibiotics   Social History   Socioeconomic History   Marital status: Married    Spouse name: Not on file   Number of children: Not on file   Years of education: Not on file   Highest education level: Not on file  Occupational History   Occupation: Retired  Tobacco Use   Smoking status: Former    Packs/day: 1.00    Years: 55.00    Pack years: 55.00    Types: Cigarettes   Smokeless tobacco: Never   Tobacco comments:    off/on, always ready to quit but does not work out  Scientific laboratory technician Use: Never used  Substance and Sexual Activity   Alcohol use: Yes    Alcohol/week: 0.0 standard drinks    Comment: occ   Drug use: No   Sexual activity: Not Currently  Other Topics Concern   Not on file  Social History Narrative   Not on file   Social Determinants of Health   Financial Resource Strain: Medium Risk   Difficulty of Paying Living Expenses: Somewhat hard  Food Insecurity: Food Insecurity Present   Worried About Running Out of Food in the Last Year: Sometimes true   Ran Out of Food in the Last Year: Never true  Transportation Needs: No Transportation Needs   Lack of Transportation (Medical): No   Lack of Transportation (Non-Medical): No  Physical Activity: Insufficiently Active   Days of Exercise per Week: 4 days   Minutes of Exercise per Session: 20 min  Stress: Stress Concern Present   Feeling of Stress : Rather much  Social Connections: Moderately Isolated   Frequency of Communication with Friends and Family: More than three times a week   Frequency of Social Gatherings with Friends and Family: Once a week   Attends Religious Services: Never   Marine scientist or Organizations: No   Attends Music therapist: Never   Marital  Status: Married     Family History:  The patient's  family history includes CAD in his father and mother; Heart attack in his  brother; Hypertension in his brother, father, and mother.   ROS:   Please see the history of present illness.    ROS All other systems reviewed and are negative.   PHYSICAL EXAM:   VS:  BP 118/72 (BP Location: Left Arm, Patient Position: Sitting, Cuff Size: Normal)    Pulse 73    Ht 6' (1.829 m)    Wt 207 lb (93.9 kg)    SpO2 93%    BMI 28.07 kg/m   Physical Exam  GEN: Well nourished, well developed, in no acute distress  Neck: no JVD, carotid bruits, or masses Cardiac:RRR; 2/6 systolic murmur LSB Respiratory: decreased breath sounds but clear to auscultation bilaterally, normal work of breathing GI: soft, nontender, nondistended, + BS Ext: mild RLE edema, brawny changes Neuro:  Alert and Oriented x 3, Psych: euthymic mood, full affect  Wt Readings from Last 3 Encounters:  04/01/21 207 lb (93.9 kg)  03/29/21 204 lb 3.2 oz (92.6 kg)  03/20/21 204 lb 3.2 oz (92.6 kg)      Studies/Labs Reviewed:   EKG:  EKG is not  ordered today.     Recent Labs: 06/07/2020: TSH 2.290 11/14/2020: ALT 51; BUN 13; Creat 1.04; Potassium 4.9; Sodium 138 12/20/2020: Hemoglobin 12.0; Platelets 158   Lipid Panel    Component Value Date/Time   CHOL 136 03/01/2020 1344   CHOL 157 09/21/2017 1546   TRIG 71 03/01/2020 1344   HDL 54 03/01/2020 1344   HDL 46 09/21/2017 1546   CHOLHDL 2.5 03/01/2020 1344   VLDL 16.6 12/29/2016 1618   LDLCALC 67 03/01/2020 1344    Additional studies/ records that were reviewed today include:  Echo 2018 Study Conclusions   - Left ventricle: The cavity size was mildly dilated. There was    mild concentric hypertrophy. Systolic function was mildly to    moderately reduced. The estimated ejection fraction was in the    range of 40% to 45%. Hypokinesis of the basal-mid inferior,    inferoseptal, anteroseptal, and inferolateral myocardium  Doppler    parameters are consistent with abnormal left ventricular    relaxation (grade 1 diastolic dysfunction). Doppler parameters    are consistent with indeterminate ventricular filling pressure.  - Aortic valve: Transvalvular velocity was within the normal range.    There was no stenosis. There was no regurgitation.  - Ascending aorta: The ascending aorta was mildly dilated.  - Mitral valve: There was trivial regurgitation.  - Left atrium: The atrium was severely dilated.  - Right ventricle: The cavity size was normal. Wall thickness was    normal. Systolic function was normal.  - Right atrium: The atrium was severely dilated.  - Atrial septum: No defect or patent foramen ovale was identified    by color flow Doppler.  - Tricuspid valve: There was no regurgitation.   -------------------------------------------------------------------    Risk Assessment/Calculations:    CHA2DS2-VASc Score = 5   This indicates a 7.2% annual risk of stroke. The patient's score is based upon: CHF History: 1 HTN History: 1 Diabetes History: 0 Stroke History: 0 Vascular Disease History: 1 Age Score: 2 Gender Score: 0        ASSESSMENT:    1. Ischemic cardiomyopathy   2. Right leg swelling   3. Coronary artery disease involving coronary bypass graft of native heart without angina pectoris   4. Chronic systolic heart failure ([place])   5. VT (ventricular tachycardia)   6. Paroxysmal atrial fibrillation (HCC)   7. Essential hypertension  8. COPD mixed type ([place])      PLAN:  In order of problems listed above:  ICM EF 40-45% on echo 2018 treatment limited b/c of soft BP. Recent increase RLE edema. Update echo.  Guzman edema-improves with extra lasix about every other week. Will increase lasix 40/20 mg every other day, K 20 meq alternating with 40 meq every other day  CAD prior CABG-no angina  Chronic systolic CHF-more edema recently. 2 gm sodium and update 2Decho  VT S/P ICD on Amio  and Mexlitine, device check 01/2021 normal. Will check surveillance labs. He's followed by Dr. Henrene Guzman for elevated LFT's.  PAF on amiodarone, mexilitine and eliquis-no bleeding problems  HTN-well controlled  Shared Decision Making/Informed Consent        Medication Adjustments/Labs and Tests Ordered: Current medicines are reviewed at length with the patient today.  Concerns regarding medicines are outlined above.  Medication changes, Labs and Tests ordered today are listed in the Patient Instructions below. Patient Instructions  Medication Instructions:  Your physician has recommended you make the following change in your medication:  INCREASE LASIX TO 40 MG EVERY OTHER DAY AND 20 MG ON THE ALTERNATIVE DAYS.  INCREASE POTASSIUM TO 40 MG EVERY OTHER DAY AND 20 MG ON THE ALTERNATIVE DAYS.  *If you need a refill on your cardiac medications before your next appointment, please call your pharmacy*  Lab Work: TODAY: CMET, CBC, FASTING LIPID PANEL, TSH  TO BE DONE SAME DAY AS ECHOCARDIOGRAM: BMET If you have labs (blood work) drawn today and your tests are completely normal, you will receive your results only by: [place] (if you have MyChart) OR A paper copy in the mail If you have any lab test that is abnormal or we need to change your treatment, we will call you to review the results.   Testing/Procedures: Your physician has requested that you have an echocardiogram. Echocardiography is a painless test that uses sound waves to create images of your heart. It provides your doctor with information about the size and shape of your heart and how well your hearts chambers and valves are working. This procedure takes approximately one hour. There are no restrictions for this procedure.   Follow-Up: At [facility], you and your health needs are our priority.  As part of our continuing mission to provide you with exceptional heart care, we have created designated Provider Care Teams.   These Care Teams include your primary Cardiologist (physician) and Advanced Practice Providers (APPs -  Physician Assistants and Nurse Practitioners) who all work together to provide you with the care you need, when you need it.  We recommend signing up for the patient portal called "MyChart".  Sign up information is provided on this After Visit Summary.  MyChart is used to connect with patients for Virtual Visits (Telemedicine).  Patients are able to view lab/test results, encounter notes, upcoming appointments, etc.  Non-urgent messages can be sent to your provider as well.   To learn more about what you can do with MyChart, go to [email or web address].    Your next appointment:    AFTER THE ECHO IS COMPLETE  Provider:   Sinclair Grooms, MD or Ermalinda Barrios, PA-C  Other Instructions  Echocardiogram An echocardiogram is a test that uses sound waves (ultrasound) to produce images of the heart. Images from an echocardiogram can provide important information about: Heart size and shape. The size and thickness and movement of your heart's walls. Heart muscle function and strength. Heart  valve function or if you have stenosis. Stenosis is when the heart valves are too narrow. If blood is flowing backward through the heart valves (regurgitation). A tumor or infectious growth around the heart valves. Areas of heart muscle that are not working well because of poor blood flow or injury from a heart attack. Aneurysm detection. An aneurysm is a weak or damaged part of an artery wall. The wall bulges out from the normal force of blood pumping through the body. Tell a health care provider about: Any allergies you have. All medicines you are taking, including vitamins, herbs, eye drops, creams, and over-the-counter medicines. Any blood disorders you have. Any surgeries you have had. Any medical conditions you have. Whether you are pregnant or may be pregnant. What are the risks? Generally,  this is a safe test. However, problems may occur, including an allergic reaction to dye (contrast) that may be used during the test. What happens before the test? No specific preparation is needed. You may eat and drink normally. What happens during the test?  You will take off your clothes from the waist up and put on a hospital gown. Electrodes or electrocardiogram (ECG)patches may be placed on your chest. The electrodes or patches are then connected to a device that monitors your heart rate and rhythm. You will lie down on a table for an ultrasound exam. A gel will be applied to your chest to help sound waves pass through your skin. A handheld device, called a transducer, will be pressed against your chest and moved over your heart. The transducer produces sound waves that travel to your heart and bounce back (or "echo" back) to the transducer. These sound waves will be captured in real-time and changed into images of your heart that can be viewed on a video monitor. The images will be recorded on a computer and reviewed by your health care provider. You may be asked to change positions or hold your breath for a short time. This makes it easier to get different views or better views of your heart. In some cases, you may receive contrast through an IV in one of your veins. This can improve the quality of the pictures from your heart. The procedure may vary among health care providers and hospitals. What can I expect after the test? You may return to your normal, everyday life, including diet, activities, and medicines, unless your health care provider tells you not to do that. Follow these instructions at home: It is up to you to get the results of your test. Ask your health care provider, or the department that is doing the test, when your results will be ready. Keep all follow-up visits. This is important. Summary An echocardiogram is a test that uses sound waves (ultrasound) to produce images  of the heart. Images from an echocardiogram can provide important information about the size and shape of your heart, heart muscle function, heart valve function, and other possible heart problems. You do not need to do anything to prepare before this test. You may eat and drink normally. After the echocardiogram is completed, you may return to your normal, everyday life, unless your health care provider tells you not to do that. This information is not intended to replace advice given to you by your health care provider. Make sure you discuss any questions you have with your health care provider. Document Revised: 11/27/2020 Document Reviewed: 11/07/2019 Elsevier Patient Education  [place].   Two Gram Sodium Diet 2000  mg  What is Sodium? Sodium is a mineral found naturally in many foods. The most significant source of sodium in the diet is table salt, which is about 40% sodium.  Processed, convenience, and preserved foods also contain a large amount of sodium.  The body needs only 500 mg of sodium daily to function,  A normal diet provides more than enough sodium even if you do not use salt.  Why Limit Sodium? A build up of sodium in the body can cause thirst, increased blood pressure, shortness of breath, and water retention.  Decreasing sodium in the diet can reduce edema and risk of heart attack or stroke associated with high blood pressure.  Keep in mind that there are many other factors involved in these health problems.  Heredity, obesity, lack of exercise, cigarette smoking, stress and what you eat all play a role.  General Guidelines: Do not add salt at the table or in cooking.  One teaspoon of salt contains over 2 grams of sodium. Read food labels Avoid processed and convenience foods Ask your dietitian before eating any foods not dicussed in the menu planning guidelines Consult your physician if you wish to use a salt substitute or a sodium containing medication such as  antacids.  Limit milk and milk products to 16 oz (2 cups) per day.  Shopping Hints: READ LABELS!! "Dietetic" does not necessarily mean low sodium. Salt and other sodium ingredients are often added to foods during processing.    Menu Planning Guidelines Food Group Choose More Often Avoid  Beverages (see also the milk group All fruit juices, low-sodium, salt-free vegetables juices, low-sodium carbonated beverages Regular vegetable or tomato juices, commercially softened water used for drinking or cooking  Breads and Cereals Enriched white, wheat, rye and pumpernickel bread, hard rolls and dinner rolls; muffins, cornbread and waffles; most dry cereals, cooked cereal without added salt; unsalted crackers and breadsticks; low sodium or homemade bread crumbs Bread, rolls and crackers with salted tops; quick breads; instant hot cereals; pancakes; commercial bread stuffing; self-rising flower and biscuit mixes; regular bread crumbs or cracker crumbs  Desserts and Sweets Desserts and sweets mad with mild should be within allowance Instant pudding mixes and cake mixes  Fats Butter or margarine; vegetable oils; unsalted salad dressings, regular salad dressings limited to 1 Tbs; light, sour and heavy cream Regular salad dressings containing bacon fat, bacon bits, and salt pork; snack dips made with instant soup mixes or processed cheese; salted nuts  Fruits Most fresh, frozen and canned fruits Fruits processed with salt or sodium-containing ingredient (some dried fruits are processed with sodium sulfites        Vegetables Fresh, frozen vegetables and low- sodium canned vegetables Regular canned vegetables, sauerkraut, pickled vegetables, and others prepared in brine; frozen vegetables in sauces; vegetables seasoned with ham, bacon or salt pork  Condiments, Sauces, Miscellaneous  Salt substitute with physician's approval; pepper, herbs, spices; vinegar, lemon or lime juice; hot pepper sauce; garlic powder,  onion powder, low sodium soy sauce (1 Tbs.); low sodium condiments (ketchup, chili sauce, mustard) in limited amounts (1 tsp.) fresh ground horseradish; unsalted tortilla chips, pretzels, potato chips, popcorn, salsa (1/4 cup) Any seasoning made with salt including garlic salt, celery salt, onion salt, and seasoned salt; sea salt, rock salt, kosher salt; meat tenderizers; monosodium glutamate; mustard, regular soy sauce, barbecue, sauce, chili sauce, teriyaki sauce, steak sauce, Worcestershire sauce, and most flavored vinegars; canned gravy and mixes; regular condiments; salted snack foods, olives, picles, relish, horseradish sauce,  catsup   Food preparation: Try these seasonings Meats:    Pork Sage, onion Serve with applesauce  Chicken Poultry seasoning, thyme, parsley Serve with cranberry sauce  Lamb Curry powder, rosemary, garlic, thyme Serve with mint sauce or jelly  Veal Marjoram, basil Serve with current jelly, cranberry sauce  Beef Pepper, bay leaf Serve with dry mustard, unsalted chive butter  Fish Bay leaf, dill Serve with unsalted lemon butter, unsalted parsley butter  Vegetables:    Asparagus Lemon juice   Broccoli Lemon juice   Carrots Mustard dressing parsley, mint, nutmeg, glazed with unsalted butter and sugar   Green beans Marjoram, lemon juice, nutmeg,dill seed   Tomatoes [name], marjoram, onion   Spice /blend for [employer]" 4 tsp ground thyme 1 tsp ground sage 3 tsp ground rosemary 4 tsp ground marjoram   Test your knowledge A product that says "Salt Free" may still contain sodium. True or False Garlic Powder and Hot Pepper Sauce an be used as alternative seasonings.True or False Processed foods have more sodium than fresh foods.  True or False Canned Vegetables have less sodium than froze True or False   WAYS TO DECREASE YOUR SODIUM INTAKE Avoid the use of added salt in cooking and at the table.  Table salt (and other prepared seasonings which contain salt) is probably  one of the greatest sources of sodium in the diet.  Unsalted foods can gain flavor from the sweet, sour, and butter taste sensations of herbs and spices.  Instead of using salt for seasoning, try the following seasonings with the foods listed.  Remember: how you use them to enhance natural food flavors is limited only by your creativity... Allspice-Meat, fish, eggs, fruit, peas, red and yellow vegetables Almond Extract-Fruit baked goods Anise Seed-Sweet breads, fruit, carrots, beets, cottage cheese, cookies (tastes like licorice) [name]-Meat, fish, eggs, vegetables, rice, vegetables salads, soups, sauces Bay Leaf-Meat, fish, stews, poultry Burnet-Salad, vegetables (cucumber-like flavor) Caraway Seed-Bread, cookies, cottage cheese, meat, vegetables, cheese, rice Cardamon-Baked goods, fruit, soups Celery Powder or seed-Salads, salad dressings, sauces, meatloaf, soup, bread.Do not use  celery salt Chervil-Meats, salads, fish, eggs, vegetables, cottage cheese (parsley-like flavor) Chili Power-Meatloaf, chicken cheese, corn, eggplant, egg dishes Chives-Salads cottage cheese, egg dishes, soups, vegetables, sauces Cilantro-Salsa, casseroles Cinnamon-Baked goods, fruit, pork, lamb, chicken, carrots Cloves-Fruit, baked goods, fish, pot roast, green beans, beets, carrots Coriander-Pastry, cookies, meat, salads, cheese (lemon-orange flavor) Cumin-Meatloaf, fish,cheese, eggs, cabbage,fruit pie (caraway flavor) [employer], fruit, eggs, fish, poultry, cottage cheese, vegetables Dill Seed-Meat, cottage cheese, poultry, vegetables, fish, salads, bread Fennel Seed-Bread, cookies, apples, pork, eggs, fish, beets, cabbage, cheese, Licorice-like flavor Garlic-(buds or powder) Salads, meat, poultry, fish, bread, butter, vegetables, potatoes.Do not  use garlic salt Ginger-Fruit, vegetables, baked goods, meat, fish, poultry Horseradish Root-Meet, vegetables, butter Lemon Juice or Extract-Vegetables, fruit,  tea, baked goods, fish salads Mace-Baked goods fruit, vegetables, fish, poultry (taste like nutmeg) Maple Extract-Syrups Marjoram-Meat, chicken, fish, vegetables, breads, green salads (taste like Sage) Mint-Tea, lamb, sherbet, vegetables, desserts, carrots, cabbage Mustard, Dry or Seed-Cheese, eggs, meats, vegetables, poultry Nutmeg-Baked goods, fruit, chicken, eggs, vegetables, desserts Onion Powder-Meat, fish, poultry, vegetables, cheese, eggs, bread, rice salads (Do not use   Onion salt) Orange Extract-Desserts, baked goods Oregano-Pasta, eggs, cheese, onions, pork, lamb, fish, chicken, vegetables, green salads Paprika-Meat, fish, poultry, eggs, cheese, vegetables Parsley Flakes-Butter, vegetables, meat fish, poultry, eggs, bread, salads (certain forms may   Contain sodium Pepper-Meat fish, poultry, vegetables, eggs Peppermint Extract-Desserts, baked goods Poppy Seed-Eggs, bread, cheese, fruit dressings, baked goods, noodles,  vegetables, cottage  [employer], poultry, meat, fish, cauliflower, turnips,eggs bread Saffron-Rice, bread, veal, chicken, fish, eggs Sage-Meat, fish, poultry, onions, eggplant, tomateos, pork, stews Savory-Eggs, salads, poultry, meat, rice, vegetables, soups, pork Tarragon-Meat, poultry, fish, eggs, butter, vegetables (licorice-like flavor)  Thyme-Meat, poultry, fish, eggs, vegetables, (clover-like flavor), sauces, soups Tumeric-Salads, butter, eggs, fish, rice, vegetables (saffron-like flavor) Vanilla Extract-Baked goods, candy Vinegar-Salads, vegetables, meat marinades Walnut Extract-baked goods, candy   2. Choose your Foods Wisely   The following is a list of foods to avoid which are high in sodium:  Meats-Avoid all smoked, canned, salt cured, dried and kosher meat and fish as well as Anchovies   [employer] meats:Bologna, Liverwurst, Pastrami Canned meat or fish  Marinated  herring Caviar    Pepperoni Corned Beef   Pizza Dried chipped beef  Salami Frozen breaded fish or meat Salt pork Frankfurters or hot dogs  Sardines Gefilte fish   Sausage Ham (boiled ham, Proscuitto Smoked butt    spiced ham)   Spam      TV Dinners Vegetables Canned vegetables (Regular) Relish Canned mushrooms  Sauerkraut Olives    Tomato juice Pickles  Bakery and Dessert Products Canned puddings  Cream pies Cheesecake   Decorated cakes Cookies  Beverages/Juices Tomato juice, regular  Gatorade   V-8 vegetable juice, regular  Breads and Cereals Biscuit mixes   Salted potato chips, corn chips, pretzels Bread stuffing mixes  Salted crackers and rolls Pancake and waffle mixes Self-rising flour  Seasonings Accent    Meat sauces Barbecue sauce  Meat tenderizer Catsup    Monosodium glutamate (MSG) Celery salt   Onion salt Chili sauce   Prepared mustard Garlic salt   Salt, seasoned salt, sea salt Gravy mixes   Soy sauce Horseradish   Steak sauce Ketchup   Tartar sauce Lite salt    Teriyaki sauce Marinade mixes   Worcestershire sauce  Others Baking powder   Cocoa and cocoa mixes Baking soda   Commercial casserole mixes Candy-caramels, chocolate  Dehydrated soups    Bars, fudge,nougats  Instant rice and pasta mixes Canned broth or soup  Maraschino cherries Cheese, aged and processed cheese and cheese spreads  Learning Assessment Quiz  Indicated T (for True) or F (for False) for each of the following statements:  _____ Fresh fruits and vegetables and unprocessed grains are generally low in sodium _____ Water may contain a considerable amount of sodium, depending on the source _____ You can always tell if a food is high in sodium by tasting it _____ Certain laxatives my be high in sodium and should be avoided unless prescribed   by a physician or pharmacist _____ Salt substitutes may be used freely by anyone on a sodium restricted diet _____ Sodium is present in table  salt, food additives and as a natural component of   most foods _____ Table salt is approximately 90% sodium _____ Limiting sodium intake may help prevent excess fluid accumulation in the body _____ On a sodium-restricted diet, seasonings such as bouillon soy sauce, and    cooking wine should be used in place of table salt _____ On an ingredient list, a product which lists monosodium glutamate as the first   ingredient is an appropriate food to include on a low sodium diet  Circle the best answer(s) to the following statements (Hint: there may be more than one correct answer)  11. On a low-sodium diet, some acceptable snack items are:    A. Quentin Cornwall  F. Bean dip   K. Grapefruit juice    B. Salted Pretzels G. Commercial Popcorn   L. Canned peaches    C. Carrot Sticks  H. Bouillon   M. Unsalted nuts   D. [place] fries  I. Peanut butter crackers N. Salami   E. Sweet pickles J. Tomato Juice   O. Pizza  12.  Seasonings that may be used freely on a reduced - sodium diet include   A. Lemon wedges F.Monosodium glutamate K. Celery seed    B.Soysauce   G. Pepper   L. Mustard powder   C. Sea salt  H. Cooking wine  M. Onion flakes   D. Vinegar  E. Prepared horseradish N. Salsa   E. Sage   J. Worcestershire sauce  O. [address]     Sumner Boast, PA-C  04/01/2021 12:40 PM    [facility] [place], [place], [place]  [address] Phone: [phone]; Fax: [phone]

## 2021-03-20 ENCOUNTER — Other Ambulatory Visit: Payer: Self-pay

## 2021-03-20 ENCOUNTER — Ambulatory Visit: Payer: Medicare Other | Admitting: Orthopedic Surgery

## 2021-03-20 ENCOUNTER — Encounter: Payer: Self-pay | Admitting: Orthopedic Surgery

## 2021-03-20 VITALS — BP 123/90 | Temp 96.9°F | Ht 72.0 in | Wt 204.2 lb

## 2021-03-20 DIAGNOSIS — Z638 Other specified problems related to primary support group: Secondary | ICD-10-CM

## 2021-03-20 DIAGNOSIS — I872 Venous insufficiency (chronic) (peripheral): Secondary | ICD-10-CM

## 2021-03-20 DIAGNOSIS — M542 Cervicalgia: Secondary | ICD-10-CM

## 2021-03-20 DIAGNOSIS — G629 Polyneuropathy, unspecified: Secondary | ICD-10-CM

## 2021-03-20 DIAGNOSIS — R748 Abnormal levels of other serum enzymes: Secondary | ICD-10-CM

## 2021-03-20 DIAGNOSIS — J358 Other chronic diseases of tonsils and adenoids: Secondary | ICD-10-CM

## 2021-03-20 DIAGNOSIS — J411 Mucopurulent chronic bronchitis: Secondary | ICD-10-CM | POA: Diagnosis not present

## 2021-03-20 DIAGNOSIS — K610 Anal abscess: Secondary | ICD-10-CM

## 2021-03-20 DIAGNOSIS — I480 Paroxysmal atrial fibrillation: Secondary | ICD-10-CM

## 2021-03-20 DIAGNOSIS — I5022 Chronic systolic (congestive) heart failure: Secondary | ICD-10-CM

## 2021-03-20 DIAGNOSIS — J449 Chronic obstructive pulmonary disease, unspecified: Secondary | ICD-10-CM | POA: Diagnosis not present

## 2021-03-20 NOTE — Patient Instructions (Addendum)
Recommend wearing compression stockings 12 hours daily, take off at night- recommend trying compression stockings with 20 mmhg  Please get next covid vaccine- Bivariant- had omicron strain in it.   Voltaren gel- may purchase over the counter- apply to neck three times daily  Please follow up with GI

## 2021-03-20 NOTE — Progress Notes (Signed)
Careteam: Patient Care Team: Steven Alanis, NP as PCP - General (Adult Health Nurse Practitioner) Belva Crome, MD as PCP - Cardiology (Cardiology) [name], MD as PCP - Electrophysiology (Cardiology) Roel Cluck, MD as Referring Physician (Ophthalmology) Harriett Sine, MD as Consulting Physician (Dermatology)  Seen by: [name], AGNP-C  PLACE OF SERVICE:  [place] Directive information Does Patient Have a Medical Advance Directive?: No  Allergies  Allergen Reactions   Sulfa Antibiotics Hives    Chief Complaint  Patient presents with   Medical Management of Chronic Issues    4 month follow up.Patient would like to have right leg checked and also would like to have bottom looked at. He recently had a perineal abscess and needs it checked. Leg  has been swelling since he was here last. Patient has received Flu vaccine and will send date to [facility] Maintenance    Zoster vaccine,pneumonia vaccine, 3rd COVID vaccine, Flu vaccine     HPI: Patient is a 80 y.o. male seen today for medical management.   12/20 he was seen by [name] for Perianal abscess. He was advised to remove packing 12/21 and start Augmentin x 7 days. Today, he is able to sit without pain. No complaints today.   Recently, had virtual visit with pulmonary. Advised to continue Trelegy, but may have to stop due to cost. May change to [facility] in future. Denies sob. He has quit smoking cigarettes for a year now.   Lower legs with intermittent swelling. Continues to take furosemide 20 mg daily, 40 mg if there is increased swelling. Scheduled to see cardiology soon to discuss. Right calf with abrasion, reports a dog scratched him. Discussed wearing ted hose to help with swelling.   He is not taking statin due to elevated LFTs per GI. He was advised to f/u in 3 months- he had not scheduled appointment.   Still having tonsil stones, plans to schedule with ENT he  has seen in past.   Increased neck pain in past month. Pain rated 4/10, no radiation, described as stiffness. Only taking gabapentin once daily.   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 She is having issues with wound on her foot. Admits to feeling stressed.   Ambulates with cane. No recent falls.   He got his flu vaccine at [facility] in [place] 2 weeks ago.   Discussed covid booster- advised to get new Bivariant due to lung history.       Review of Systems:  Review of Systems  Constitutional:  Negative for chills, fever, malaise/fatigue and weight loss.  HENT:  Negative for hearing loss and sore throat.   Eyes:  Negative for blurred vision and double vision.  Respiratory:  Positive for cough, sputum production, shortness of breath and wheezing.   Cardiovascular:  Positive for leg swelling. Negative for chest pain.  Gastrointestinal:  Negative for abdominal pain, blood in stool, constipation, diarrhea, heartburn, nausea and vomiting.  Genitourinary:  Negative for dysuria and hematuria.  Musculoskeletal:  Negative for falls and myalgias.  Neurological:  Positive for dizziness. Negative for weakness and headaches.  Psychiatric/Behavioral:  Negative for depression. The patient is not nervous/anxious and does not have insomnia.    Past Medical History:  Diagnosis Date   AICD (automatic cardioverter/defibrillator) present    Aneurysm ([name])    a. Aneurysmal infrarenal aorta up to 33 mm on CT 10/2014, recommended f/u due 10/2017   Anginal pain ([name])  Anxiety    Basal cell carcinoma of nose    S/P MOHS   Biliary acute pancreatitis    CAD (coronary artery disease)    a. s/p MI in 1994 with PCI to LAD at that time b. cath 10/2012 demonstrated EF 30%, inferior akinesis with mild hypokinesis of all walls, patent LAD and RCA stents; ostial PDA with 80-90% obstruction with medical therapy recommended    Chronic systolic CHF (congestive heart failure) (HCC)    EF 30 to 35 % as of  09/2014.    CKD (chronic kidney disease), stage III ([name])    Complication of anesthesia 10/2014   "had to have defibrillator w/ERCP"   COPD (chronic obstructive pulmonary disease) ([name])    a. followed by pulmonary, COPD GOLD stage II   Depression    Diverticulosis of colon 07/2014   noted on CT   GERD (gastroesophageal reflux disease)    Hiatal hernia    Hyperglycemia 10/2012.   Hyperlipidemia    Hypertension    Myocardial infarction [name]) 1994; 2011   Pneumonia 1946; 2015   Prostate enlargement 07/2014   observed on CT   Tobacco abuse    Ventricular tachycardia    a. 08/2009 s/p BSX E110 Teligen 100 AICD, ser#: [id number];  b. 08/2008 VT req ATP - detection reprogrammed from 160 to 150. c. EPS and VT ablation by Dr. Lovena Le 12/21/2014   Past Surgical History:  Procedure Laterality Date   BIOPSY  12/21/2017   Procedure: BIOPSY;  Surgeon: [name], MD;  Location: [facility];  Service: Endoscopy;;   CATARACT EXTRACTION W/ INTRAOCULAR LENS  IMPLANT, BILATERAL Bilateral ~ 2011   COLONOSCOPY     COLONOSCOPY WITH PROPOFOL N/A 12/21/2017   Procedure: COLONOSCOPY WITH PROPOFOL;  Surgeon: [name], MD;  Location: [facility];  Service: Endoscopy;  Laterality: N/A;   ELECTROPHYSIOLOGIC STUDY N/A 12/21/2014   Procedure: V Tach Ablation;  Surgeon: [name], MD;  Location: [place] [facility];  Service: Cardiovascular;  Laterality: N/A;   ERCP N/A 11/16/2014   Procedure: ENDOSCOPIC RETROGRADE CHOLANGIOPANCREATOGRAPHY (ERCP);  Surgeon: [name], MD;  Location: [place];  Service: Endoscopy;  Laterality: N/A;   ESOPHAGOGASTRODUODENOSCOPY (EGD) WITH PROPOFOL N/A 12/21/2017   Procedure: ESOPHAGOGASTRODUODENOSCOPY (EGD) WITH PROPOFOL;  Surgeon: [name], MD;  [facility];  Service: Endoscopy;  Laterality: N/A;   EYE SURGERY     FOOT SURGERY Left 2005   "fixed bone that stuck out in my ankle area"   HEMORRHOID BANDING     IMPLANTABLE CARDIOVERTER DEFIBRILLATOR IMPLANT   09/06/09   BSX dual chamber ICD implanted in [place] for cardiac arrest 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   [place] Right ~ [name] 11/25/2012   demonstrated EF 30%, inferior akinesis with mild hypokinesis of all walls, patent LAD and RCA stents; ostial PDA with 80-90% obstruction with medical therapy recommended   MOHS SURGERY  2008   nose, skin graft   POLYPECTOMY  12/21/2017   Procedure: POLYPECTOMY;  Surgeon: [name], MD;  [facility];  Service: Endoscopy;;   RETINAL DETACHMENT SURGERY Right 2013   TENOLYSIS Right 12/21/2013   Procedure: TENOLYSIS FLEXOR CARPI RADIALIS ,DEBRIDEMENT RIGHT JOINT WRIST,DEBRIDEMENT SCAPHOTRAPEZIAL TRAPEZOID, REPAIR OF EXTENSOR HOOD;  Surgeon: Daryll Brod, MD;  Location: [place];  Service: Orthopedics;  Laterality: Right;   TOE SURGERY Right 09/2019   3rd toe/hammer toe   V-TACH ABLATION  12/21/2014   VIDEO BRONCHOSCOPY Bilateral 01/09/2016   Procedure: VIDEO BRONCHOSCOPY WITHOUT FLUORO;  Surgeon: Juanito Doom, MD;  [facility];  Service: Cardiopulmonary;  Laterality: Bilateral;   Social History:   reports that he has quit smoking. His smoking use included cigarettes. He has a 55.00 pack-year smoking history. He has never used smokeless tobacco. He reports current alcohol use. He reports that he does not use drugs.  Family History  Problem Relation Age of Onset   Heart attack Brother    CAD Father    Hypertension Father    CAD Mother    Hypertension Mother    Hypertension Brother    Stroke Neg Hx     Medications: Patient's Medications  New Prescriptions   No medications on file  Previous Medications   ALBUTEROL (PROVENTIL) (2.5 MG/3ML) 0.083% NEBULIZER SOLUTION    USE 1 VIAL VIA NEBULIZER EVERY 6 HOURS AS NEEDED FOR WHEEZING OR SHORTNESS OF BREATH   AMIODARONE (PACERONE) 200 MG TABLET    Take 1 tablet (200 mg total) by mouth daily.    AMOXICILLIN PO    Take 1 tablet by mouth every 12 (twelve) hours.   APIXABAN (ELIQUIS) 5 MG TABS TABLET    Take 1 tablet (5 mg total) by mouth 2 (two) times daily.   ARTIFICIAL TEAR SOLUTION (SOOTHE XP OP)    Place 2 drops into both eyes daily as needed (dry eyes).   BENAZEPRIL (LOTENSIN) 10 MG TABLET    Take 1 tablet (10 mg total) by mouth daily.   BUDESONIDE-FORMOTEROL (SYMBICORT) 160-4.5 MCG/ACT INHALER    Inhale 2 puffs into the lungs 2 (two) times daily.   BUPROPION (WELLBUTRIN) 75 MG TABLET    Take 1 tablet (75 mg total) by mouth daily.   BUSPIRONE (BUSPAR) 15 MG TABLET    TAKE 1 TABLET BY MOUTH TWICE A DAY   CARVEDILOL (COREG) 3.125 MG TABLET    Take 1 tablet (3.125 mg total) by mouth 2 (two) times daily.   CETIRIZINE (ZYRTEC) 10 MG TABLET    Take 1 tablet by mouth daily as needed.   CHOLINE FENOFIBRATE (FENOFIBRIC ACID) 135 MG CPDR    TAKE ONE CAPSULE BY MOUTH DAILY   CYANOCOBALAMIN (VITAMIN B-12) 5000 MCG SUBL    Place under the tongue daily.   ESCITALOPRAM (LEXAPRO) 20 MG TABLET    Take 1 tablet (20 mg total) by mouth daily.   FINASTERIDE (PROSCAR) 5 MG TABLET    Take 5 mg by mouth daily.   FLUTICASONE (FLONASE) 50 MCG/ACT NASAL SPRAY    Place 2 sprays into both nostrils daily.   FLUTICASONE-UMECLIDIN-VILANT (TRELEGY ELLIPTA) 200-62.5-25 MCG/ACT AEPB    Inhale 1 puff into the lungs daily.   FUROSEMIDE (LASIX) 40 MG TABLET    Take 1/2 tablet by mouth daily.   GABAPENTIN (NEURONTIN) 300 MG CAPSULE    Take 1 capsule (300 mg total) by mouth 3 (three) times daily.   IPRATROPIUM-ALBUTEROL (COMBIVENT RESPIMAT) 20-100 MCG/ACT AERS RESPIMAT    Inhale 1 puff into the lungs every 6 (six) hours. Shortness of breath or wheezing   IRON PO    Take by mouth daily.    MAGNESIUM-OXIDE 400 (241.3 MG) MG TABLET    TAKE 1 TABSULE BY MOUTH DAILY   MEXILETINE (MEXITIL) 200 MG CAPSULE    Take 1 capsule (200 mg total) by mouth 2 (two) times daily.   MULTIPLE VITAMINS-MINERALS (CENTRUM ADULTS PO)    Take by  mouth daily.  NITROGLYCERIN (NITROSTAT) 0.4 MG SL TABLET    DISSOLVE 1 TABLET UNDER THE TONGUE EVERY 5 MINUTES FOR 3 DOSES AS NEEDED FOR CHEST PAIN   OMEPRAZOLE (PRILOSEC) 20 MG CAPSULE    Take 20 mg by mouth daily.    POTASSIUM CHLORIDE SA (KLOR-CON M20) 20 MEQ TABLET    TAKE 1 TABLET BY MOUTH EVERY DAY   RESPIRATORY THERAPY SUPPLIES (FLUTTER) DEVI    Use as directed   SENNOSIDES-DOCUSATE SODIUM (STOOL SOFTENER/LAXATIVE PO)    Take 2 tablets by mouth daily.   SPACER/AERO-HOLDING CHAMBERS (OPTICHAMBER DIAMOND) MISC    optichamber VHC   TAMSULOSIN (FLOMAX) 0.4 MG CAPS CAPSULE    Take one capsule by mouth once daily after supper.   TIOTROPIUM BROMIDE MONOHYDRATE (SPIRIVA RESPIMAT) 2.5 MCG/ACT AERS    Inhale 2 puffs into the lungs daily.  Modified Medications   No medications on file  Discontinued Medications   No medications on file    Physical Exam:  There were no vitals filed for this visit. There is no height or weight on file to calculate BMI. Wt Readings from Last 3 Encounters:  02/24/21 206 lb 6.4 oz (93.6 kg)  02/13/21 207 lb 6.4 oz (94.1 kg)  01/02/21 206 lb (93.4 kg)    Physical Exam Vitals reviewed.  Constitutional:      General: He is not in acute distress. HENT:     Head: Normocephalic.  Eyes:     General:        Right eye: No discharge.        Left eye: No discharge.     Comments: glasses  Neck:     Thyroid: No thyroid mass or thyromegaly.     Vascular: No carotid bruit.     Comments: Forward neck protrusion, limited ROM Cardiovascular:     Rate and Rhythm: Normal rate. Rhythm irregular.     Pulses: Normal pulses.     Heart sounds: Normal heart sounds. No murmur heard. Pulmonary:     Effort: Pulmonary effort is normal. No respiratory distress.     Breath sounds: Normal breath sounds. No wheezing.  Abdominal:     General: Bowel sounds are normal. There is no distension.     Palpations: Abdomen is soft.     Tenderness: There is no abdominal tenderness.   Genitourinary:    Comments: Small pea sized wound to left buttocks, no redness/drainage/tenderness, granulation tissue present.  Musculoskeletal:     Cervical back: No signs of trauma or crepitus. Decreased range of motion.     Right lower leg: Edema present.     Left lower leg: No edema.     Comments: 1+ pitting  Lymphadenopathy:     Cervical: No cervical adenopathy.  Skin:    General: Skin is warm and dry.     Capillary Refill: Capillary refill takes less than 2 seconds.  Neurological:     General: No focal deficit present.     Mental Status: He is alert and oriented to person, place, and time.     Motor: Weakness present.     Gait: Gait abnormal.     Comments: Cane  Psychiatric:        Mood and Affect: Mood normal.        Behavior: Behavior normal.    Labs reviewed: Basic Metabolic Panel: Recent Labs    06/07/20 1434 07/07/20 0554 11/14/20 1502  NA 142 136 138  K 4.4 3.7 4.9  CL 102 102 103  CO2 23  26 28  GLUCOSE 90 102* 83  BUN 11 21 13   CREATININE 1.08 1.11 1.04  CALCIUM 9.5 9.2 10.0  TSH 2.290  --   --    Liver Function Tests: Recent Labs    06/07/20 1434 11/14/20 1502  AST 61* 46*  ALT 67* 51*  ALKPHOS 54  --   BILITOT 0.8 0.8  PROT 6.6 6.4  ALBUMIN 4.3  --    No results for input(s): LIPASE, AMYLASE in the last 8760 hours. No results for input(s): AMMONIA in the last 8760 hours. CBC: Recent Labs    07/07/20 0554 11/14/20 1502 12/20/20 1410  WBC 4.2 4.5 3.3*  NEUTROABS 2.4 2,907 2,056  HGB 11.9* 12.9* 12.0*  HCT 36.2* 38.1* 36.9*  MCV 102.5* 100.5* 102.2*  PLT 167 119* 158   Lipid Panel: No results for input(s): CHOL, HDL, LDLCALC, TRIG, CHOLHDL, LDLDIRECT in the last 8760 hours. TSH: Recent Labs    06/07/20 1434  TSH 2.290   A1C: Lab Results  Component Value Date   HGBA1C 5.6 12/29/2016     Assessment/Plan 1. Perianal abscess - followed by [facility] - no sign of infection - cont Augmentin  2. COPD mixed  type ([place]) - followed by Dr. Mariana Arn - quit smoking 1 years ago - remains on Trelegy- may switch to [place] in future  3. Mucopurulent chronic bronchitis (HCC) - stable with mucinex  4. Chronic venous insufficiency - RLE>LLE - scheduled to discuss with cardiology - cont furosemide   5. Elevated liver enzymes - followed by Dr. Henrene Pastor - statin held x 3 months - advised to schedule f/u  6. Tonsillolith - plans to see ENT  7. Peripheral polyneuropathy - stable with gabapentin  8. Paroxysmal atrial fibrillation (HCC) - HR controlled with amiodarone and coreg - cont Eliquis for clot prevention  9. Chronic systolic heart failure (HCC) - LV EF 40-45% 2018 -  no weight fluctuations, increased sob or ankle edema - cont furosemide   10. Caregiver role strain - reports stress at times, managing well at this time  55. Neck pain - limited ROM, forward neck protrusion - pain stiffness, no radiation - cont gabapentin - recommend voltaren gel 1%- apply to neck 3-4x daily for pain  Total time: 38 minutes. Greater than 50% of total time spent doing patient education regarding health maintenance, vaccinations, symptom/medication management.    Next appt: Visit date not found  [place] [phone]

## 2021-03-21 ENCOUNTER — Other Ambulatory Visit: Payer: Self-pay | Admitting: Orthopedic Surgery

## 2021-03-21 DIAGNOSIS — R748 Abnormal levels of other serum enzymes: Secondary | ICD-10-CM

## 2021-03-21 DIAGNOSIS — I10 Essential (primary) hypertension: Secondary | ICD-10-CM

## 2021-03-21 DIAGNOSIS — E7849 Other hyperlipidemia: Secondary | ICD-10-CM

## 2021-03-25 ENCOUNTER — Encounter: Payer: Self-pay | Admitting: Internal Medicine

## 2021-03-26 MED ORDER — APIXABAN 5 MG PO TABS: 5.0000 mg | ORAL_TABLET | Freq: Two times a day (BID) | ORAL | 1 refills | Status: DC

## 2021-03-29 ENCOUNTER — Encounter (HOSPITAL_BASED_OUTPATIENT_CLINIC_OR_DEPARTMENT_OTHER): Payer: Self-pay | Admitting: Emergency Medicine

## 2021-03-29 ENCOUNTER — Emergency Department (HOSPITAL_BASED_OUTPATIENT_CLINIC_OR_DEPARTMENT_OTHER)
Admission: EM | Admit: 2021-03-29 | Discharge: 2021-03-29 | Disposition: A | Discharge status: Home / Self Care | Payer: Medicare Other | Attending: Emergency Medicine | Admitting: Emergency Medicine

## 2021-03-29 ENCOUNTER — Emergency Department (HOSPITAL_BASED_OUTPATIENT_CLINIC_OR_DEPARTMENT_OTHER): Payer: Medicare Other

## 2021-03-29 ENCOUNTER — Other Ambulatory Visit: Payer: Self-pay

## 2021-03-29 DIAGNOSIS — J449 Chronic obstructive pulmonary disease, unspecified: Secondary | ICD-10-CM | POA: Insufficient documentation

## 2021-03-29 DIAGNOSIS — Z85828 Personal history of other malignant neoplasm of skin: Secondary | ICD-10-CM | POA: Diagnosis not present

## 2021-03-29 DIAGNOSIS — I5022 Chronic systolic (congestive) heart failure: Secondary | ICD-10-CM | POA: Diagnosis not present

## 2021-03-29 DIAGNOSIS — I2510 Atherosclerotic heart disease of native coronary artery without angina pectoris: Secondary | ICD-10-CM | POA: Insufficient documentation

## 2021-03-29 DIAGNOSIS — Z79899 Other long term (current) drug therapy: Secondary | ICD-10-CM | POA: Diagnosis not present

## 2021-03-29 DIAGNOSIS — Z7901 Long term (current) use of anticoagulants: Secondary | ICD-10-CM | POA: Insufficient documentation

## 2021-03-29 DIAGNOSIS — R0781 Pleurodynia: Secondary | ICD-10-CM | POA: Diagnosis present

## 2021-03-29 DIAGNOSIS — I130 Hypertensive heart and chronic kidney disease with heart failure and stage 1 through stage 4 chronic kidney disease, or unspecified chronic kidney disease: Secondary | ICD-10-CM | POA: Insufficient documentation

## 2021-03-29 DIAGNOSIS — Z87891 Personal history of nicotine dependence: Secondary | ICD-10-CM | POA: Diagnosis not present

## 2021-03-29 DIAGNOSIS — N183 Chronic kidney disease, stage 3 unspecified: Secondary | ICD-10-CM | POA: Diagnosis not present

## 2021-03-29 MED ORDER — METHOCARBAMOL 500 MG PO TABS: 500.0000 mg | ORAL_TABLET | Freq: Two times a day (BID) | ORAL | 0 refills | Status: AC | PRN

## 2021-03-29 NOTE — ED Notes (Signed)
Discharge instructions discussed with pt. Pt verbalized understanding. Pt stable and ambulatory.  °

## 2021-03-29 NOTE — ED Provider Notes (Signed)
[facility] EMERGENCY DEPARTMENT Provider Note   CSN: [id number] Arrival date & time: 03/29/21  1648     History Chief Complaint  Patient presents with   Rib pain    Steven Guzman is a 80 y.o. male.  HPI Patient presents for right-sided rib pain.  The onset of this pain was sudden and occurred as he was reaching over the arm of the chair.  The arm of the chair pressed into the area where he has since had pain.  Pain is worsened with movements and palpation.  This injury occurred 4 days ago.  He has been treating his pain at home with Tylenol.  Since he started having this rib pain, he has also had some pain in the right side of his upper back.  Back pain is also worsened with movement and positioning.  He does not endorse significantly increased pain with deep inspiration.  Currently, his pain is minimal.    Past Medical History:  Diagnosis Date   AICD (automatic cardioverter/defibrillator) present    Aneurysm ([place])    a. Aneurysmal infrarenal aorta up to 33 mm on CT 10/2014, recommended f/u due 10/2017   Anginal pain ([place])    Anxiety    Basal cell carcinoma of nose    S/P MOHS   Biliary acute pancreatitis    CAD (coronary artery disease)    a. s/p MI in 1994 with PCI to LAD at that time b. cath 10/2012 demonstrated EF 30%, inferior akinesis with mild hypokinesis of all walls, patent LAD and RCA stents; ostial PDA with 80-90% obstruction with medical therapy recommended    Chronic systolic CHF (congestive heart failure) (HCC)    EF 30 to 35 % as of 09/2014.    CKD (chronic kidney disease), stage III ([place])    Complication of anesthesia 10/2014   "had to have defibrillator w/ERCP"   COPD (chronic obstructive pulmonary disease) ([place])    a. followed by pulmonary, COPD GOLD stage II   Depression    Diverticulosis of colon 07/2014   noted on CT   GERD (gastroesophageal reflux disease)    Hiatal hernia    Hyperglycemia 10/2012.   Hyperlipidemia    Hypertension     Myocardial infarction [facility]) 1994; 2011   Pneumonia 1946; 2015   Prostate enlargement 07/2014   observed on CT   Tobacco abuse    Ventricular tachycardia    a. 08/2009 s/p BSX E110 Teligen 100 AICD, ser#: [id number];  b. 08/2008 VT req ATP - detection reprogrammed from 160 to 150. c. EPS and VT ablation by Dr. Lovena Le 12/21/2014    Patient Active Problem List   Diagnosis Date Noted   Major depressive disorder with single episode, in partial remission ([place]) 03/05/2020   Paraseptal emphysema ([place]) 12/15/2018   Tobacco abuse 12/15/2018   Perianal abscess 11/15/2018   Left inguinal hernia 11/15/2018   Leg wound, right 11/15/2018   Weight loss 09/29/2018   Dark stools 09/29/2018   Lumbar trigger point syndrome 04/20/2018   Throat and mouth symptom 02/03/2018   Gastroesophageal reflux disease without esophagitis    Esophageal stricture    Gastritis and gastroduodenitis    Degenerative cervical spinal stenosis 10/27/2017   Carotid bruit 10/27/2017   B12 deficiency 08/25/2017   Anemia 06/10/2017   Right ankle pain 04/09/2017   BPH (benign prostatic hyperplasia) 12/31/2016   Dizzinesses 05/22/2016   Mass of throat 06/30/2015   Depression 05/24/2015   Atrial fibrillation ([place]) 03/15/2015  Routine general medical examination at a health care facility 05/12/2014   Hemoptysis 03/16/2014   COPD GOLD GRADE C 37/12/6267   Chronic systolic heart failure ([place]) 07/07/2013   CAD (coronary artery disease) 11/23/2012   Smokers' cough ([place]) 11/23/2012   Ventricular tachycardia 11/23/2012   Essential hypertension 11/23/2012   Hyperlipidemia 11/23/2012   ICD (implantable cardioverter-defibrillator) in place 11/23/2012    Past Surgical History:  Procedure Laterality Date   BIOPSY  12/21/2017   Procedure: BIOPSY;  Surgeon: [name], MD;  Location: [facility];  Service: Endoscopy;;   CATARACT EXTRACTION W/ INTRAOCULAR LENS  IMPLANT, BILATERAL Bilateral ~ 2011   COLONOSCOPY     COLONOSCOPY WITH  PROPOFOL N/A 12/21/2017   Procedure: COLONOSCOPY WITH PROPOFOL;  Surgeon: [name], MD;  Location: [facility];  Service: Endoscopy;  Laterality: N/A;   ELECTROPHYSIOLOGIC STUDY N/A 12/21/2014   Procedure: V Tach Ablation;  Surgeon: [name], MD;  Location: [facility] CV LAB;  Service: Cardiovascular;  Laterality: N/A;   ERCP N/A 11/16/2014   Procedure: ENDOSCOPIC RETROGRADE CHOLANGIOPANCREATOGRAPHY (ERCP);  Surgeon: [name], MD;  Location: [facility];  Service: Endoscopy;  Laterality: N/A;   ESOPHAGOGASTRODUODENOSCOPY (EGD) WITH PROPOFOL N/A 12/21/2017   Procedure: ESOPHAGOGASTRODUODENOSCOPY (EGD) WITH PROPOFOL;  Surgeon: [name], MD;  [facility];  Service: Endoscopy;  Laterality: N/A;   EYE SURGERY     FOOT SURGERY Left 2005   "fixed bone that stuck out in my ankle area"   HEMORRHOID BANDING     IMPLANTABLE CARDIOVERTER DEFIBRILLATOR IMPLANT  09/06/09   BSX dual chamber ICD implanted in [place] for cardiac arrest 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   [place] Right ~ [place] N/A 11/25/2012   demonstrated EF 30%, inferior akinesis with mild hypokinesis of all walls, patent LAD and RCA stents; ostial PDA with 80-90% obstruction with medical therapy recommended   MOHS SURGERY  2008   nose, skin graft   POLYPECTOMY  12/21/2017   Procedure: POLYPECTOMY;  Surgeon: [name], MD;  [facility];  Service: Endoscopy;;   RETINAL DETACHMENT SURGERY Right 2013   TENOLYSIS Right 12/21/2013   Procedure: TENOLYSIS FLEXOR CARPI RADIALIS ,DEBRIDEMENT RIGHT JOINT WRIST,DEBRIDEMENT SCAPHOTRAPEZIAL TRAPEZOID, REPAIR OF EXTENSOR HOOD;  Surgeon: Daryll Brod, MD;  Location: [facility];  Service: Orthopedics;  Laterality: Right;   TOE SURGERY Right 09/2019   3rd toe/hammer toe   V-TACH ABLATION  12/21/2014   VIDEO BRONCHOSCOPY Bilateral 01/09/2016   Procedure: VIDEO BRONCHOSCOPY WITHOUT FLUORO;   Surgeon: Juanito Doom, MD;  [facility];  Service: Cardiopulmonary;  Laterality: Bilateral;       Family History  Problem Relation Age of Onset   Heart attack Brother    CAD Father    Hypertension Father    CAD Mother    Hypertension Mother    Hypertension Brother    Stroke Neg Hx     Social History   Tobacco Use   Smoking status: Former    Packs/day: 1.00    Years: 55.00    Pack years: 55.00    Types: Cigarettes   Smokeless tobacco: Never   Tobacco comments:    off/on, always ready to quit but does not work out  Scientific laboratory technician Use: Never used  Substance Use Topics   Alcohol use: Yes    Alcohol/week: 0.0 standard drinks    Comment: occ   Drug use:  No    Home Medications Prior to Admission medications   Medication Sig Start Date End Date Taking? Authorizing Provider  methocarbamol (ROBAXIN) 500 MG tablet Take 1 tablet (500 mg total) by mouth every 12 (twelve) hours as needed for up to 5 days for muscle spasms. 03/29/21 04/03/21 Yes Godfrey Pick, MD  albuterol (PROVENTIL) (2.5 MG/3ML) 0.083% nebulizer solution USE 1 VIAL VIA NEBULIZER EVERY 6 HOURS AS NEEDED FOR WHEEZING OR SHORTNESS OF BREATH 07/18/19   Olalere, Cicero Duck A, MD  amiodarone (PACERONE) 200 MG tablet Take 1 tablet (200 mg total) by mouth daily. 11/22/20   Belva Crome, MD  AMOXICILLIN PO Take 1 tablet by mouth every 12 (twelve) hours.    [provider]  apixaban (ELIQUIS) 5 MG TABS tablet Take 1 tablet (5 mg total) by mouth 2 (two) times daily. 03/26/21   Belva Crome, MD  Artificial Tear Solution (SOOTHE XP OP) Place 2 drops into both eyes daily as needed (dry eyes).    [provider]  benazepril (LOTENSIN) 10 MG tablet Take 1 tablet (10 mg total) by mouth daily. 11/22/20   Belva Crome, MD  budesonide-formoterol [facility]) 160-4.5 MCG/ACT inhaler Inhale 2 puffs into the lungs 2 (two) times daily. Patient not taking: Reported on 03/20/2021 05/10/20   Yvonna Alanis, NP   buPROPion (WELLBUTRIN) 75 MG tablet Take 1 tablet (75 mg total) by mouth daily. 11/29/20   Fargo, Amy E, NP  busPIRone (BUSPAR) 15 MG tablet TAKE 1 TABLET BY MOUTH TWICE A DAY Patient not taking: Reported on 03/20/2021 09/16/20   Yvonna Alanis, NP  carvedilol (COREG) 3.125 MG tablet Take 1 tablet (3.125 mg total) by mouth 2 (two) times daily. 11/22/20   Belva Crome, MD  cetirizine (ZYRTEC) 10 MG tablet Take 1 tablet by mouth daily as needed. 12/16/17   [provider]  Choline Fenofibrate (FENOFIBRIC ACID) 135 MG CPDR TAKE ONE CAPSULE BY MOUTH DAILY 03/11/21   Belva Crome, MD  Cyanocobalamin (VITAMIN B-12) 5000 MCG SUBL Place under the tongue daily.    [provider]  escitalopram (LEXAPRO) 20 MG tablet Take 1 tablet (20 mg total) by mouth daily. 02/16/21   Fargo, Amy E, NP  finasteride (PROSCAR) 5 MG tablet Take 5 mg by mouth daily. 06/24/20   [provider]  fluticasone (FLONASE) 50 MCG/ACT nasal spray Place 2 sprays into both nostrils daily. 02/13/21   Fargo, Amy E, NP  Fluticasone-Umeclidin-Vilant (TRELEGY ELLIPTA) 200-62.5-25 MCG/ACT AEPB Inhale 1 puff into the lungs daily.    [provider]  furosemide (LASIX) 40 MG tablet Take 1/2 tablet by mouth daily. 11/22/20   Belva Crome, MD  gabapentin (NEURONTIN) 300 MG capsule Take 1 capsule (300 mg total) by mouth 3 (three) times daily. 12/23/20   Fargo, Amy E, NP  Ipratropium-Albuterol (COMBIVENT RESPIMAT) 20-100 MCG/ACT AERS respimat Inhale 1 puff into the lungs every 6 (six) hours. Shortness of breath or wheezing 05/10/20   Fargo, Amy E, NP  IRON PO Take by mouth daily.     [provider]  MAGNESIUM-OXIDE 400 (241.3 Mg) MG tablet TAKE 1 TABSULE BY MOUTH DAILY 11/24/18   Hoyt Koch, MD  mexiletine (MEXITIL) 200 MG capsule Take 1 capsule (200 mg total) by mouth 2 (two) times daily. 03/17/21   Belva Crome, MD  Multiple Vitamins-Minerals (CENTRUM ADULTS PO) Take by mouth daily.     [provider]  nitroGLYCERIN (NITROSTAT) 0.4 MG SL tablet DISSOLVE  1 TABLET UNDER THE TONGUE EVERY 5 MINUTES FOR 3 DOSES AS NEEDED FOR CHEST PAIN 11/22/20   Belva Crome, MD  omeprazole (PRILOSEC) 20 MG capsule Take 20 mg by mouth daily.     [provider]  potassium chloride SA (KLOR-CON M20) 20 MEQ tablet TAKE 1 TABLET BY MOUTH EVERY DAY 11/22/20   Belva Crome, MD  Respiratory Therapy Supplies (FLUTTER) DEVI Use as directed 03/16/17   Juanito Doom, MD  Sennosides-Docusate Sodium (STOOL SOFTENER/LAXATIVE PO) Take 2 tablets by mouth daily.    [provider]  Spacer/Aero-Holding Chambers [facility] DIAMOND) [place] optichamber [facility] 09/12/19   Olalere, Ernesto Rutherford, MD  tamsulosin (FLOMAX) 0.4 MG CAPS capsule Take one capsule by mouth once daily after supper. 12/30/20   Fargo, Amy E, NP  Tiotropium Bromide Monohydrate (SPIRIVA RESPIMAT) 2.5 MCG/ACT AERS Inhale 2 puffs into the lungs daily. Patient not taking: Reported on 03/20/2021 04/09/20   Yvonna Alanis, NP    Allergies    Sulfa antibiotics  Review of Systems   Review of Systems  Constitutional:  Negative for chills and fever.  HENT:  Negative for ear pain and sore throat.   Eyes:  Negative for pain and visual disturbance.  Respiratory:  Negative for cough and shortness of breath.   Cardiovascular:  Positive for chest pain (Right anterior lateral chest wall). Negative for palpitations.  Gastrointestinal:  Negative for abdominal pain, nausea and vomiting.  Genitourinary:  Negative for dysuria and hematuria.  Musculoskeletal:  Positive for back pain (Right upper back). Negative for arthralgias, myalgias, neck pain and neck stiffness.  Skin:  Negative for color change, rash and wound.  Neurological:  Negative for dizziness, seizures, syncope, weakness, light-headedness, numbness and headaches.  All other systems reviewed and are negative.  Physical Exam Updated Vital Signs BP 116/71 (BP Location: Left Arm)     Pulse 62    Temp 97.8 F (36.6 C) (Oral)    Resp 16    Ht 6' (1.829 m)    Wt 92.6 kg    SpO2 95%    BMI 27.69 kg/m   Physical Exam Vitals and nursing note reviewed.  Constitutional:      General: He is not in acute distress.    Appearance: Normal appearance. He is well-developed and normal weight. He is not ill-appearing, toxic-appearing or diaphoretic.  HENT:     Head: Normocephalic and atraumatic.     Right Ear: External ear normal.     Left Ear: External ear normal.     Nose: Nose normal. No congestion.  Eyes:     General: No scleral icterus.    Extraocular Movements: Extraocular movements intact.     Conjunctiva/sclera: Conjunctivae normal.  Cardiovascular:     Rate and Rhythm: Normal rate and regular rhythm.     Heart sounds: No murmur heard. Pulmonary:     Effort: Pulmonary effort is normal. No respiratory distress.     Breath sounds: Normal breath sounds. No wheezing, rhonchi or rales.  Chest:     Chest wall: Tenderness present.  Abdominal:     Palpations: Abdomen is soft.     Tenderness: There is no abdominal tenderness.  Musculoskeletal:        General: Tenderness present. No swelling or deformity.     Cervical back: Normal range of motion and neck supple. No rigidity.  Skin:    General: Skin is warm and dry.     Capillary Refill: Capillary refill takes less than 2  seconds.  Neurological:     General: No focal deficit present.     Mental Status: He is alert and oriented to person, place, and time.     Cranial Nerves: No cranial nerve deficit.     Sensory: No sensory deficit.     Motor: No weakness.     Coordination: Coordination normal.  Psychiatric:        Mood and Affect: Mood normal.        Behavior: Behavior normal.        Thought Content: Thought content normal.        Judgment: Judgment normal.    ED Results / Procedures / Treatments   Labs (all labs ordered are listed, but only abnormal results are displayed) Labs Reviewed - No data to  display  EKG None  Radiology DG Ribs Unilateral W/Chest Right  Result Date: 03/29/2021 CLINICAL DATA:  Right rib pain. EXAM: RIGHT RIBS AND CHEST - 3+ VIEW COMPARISON:  Chest x-ray 03/19/2019. FINDINGS: Left-sided pacemaker is present. The heart is enlarged, unchanged. The aorta is tortuous a large hiatal hernia is seen there is no focal lung consolidation, pleural effusion pneumothorax. There is linear atelectasis or scarring in the left lung base. No there is a nondisplaced anterolateral right fourth rib fracture. This is age indeterminate. IMPRESSION: 1. Age indeterminate nondisplaced right fourth rib fracture. 2. Stable cardiomegaly. 3. Stable hiatal hernia. 4. The lungs are clear. Electronically Signed   By: Ronney Asters M.D.   On: 03/29/2021 17:57    Procedures Procedures   Medications Ordered in ED Medications - No data to display  ED Course  I have reviewed the triage vital signs and the nursing notes.  Pertinent labs & imaging results that were available during my care of the patient were reviewed by me and considered in my medical decision making (see chart for details).    MDM Rules/Calculators/A&P                         Patient is a very pleasant 80 year old male who presents for right anterolateral rib pain that occurred following an injury where he was reaching over the arm of the chair.  He has since developed pain in the right upper aspect of his back.  On arrival in the ED, his vital signs are normal.  He is well-appearing on exam.  He does not appear to be splinting or have any increased work of breathing.  He does have areas of focal tenderness to the anterior lateral, inframammary line of his right chest wall, as well as a focal area of tenderness to the right subscapular area of his back.  Chest x-ray did show a nondisplaced fracture in the area of his chest wall pain and tenderness.  Given his clinical correlation, this is likely an acute fracture.  Given that the  area of back pain followed this rib injury, this is likely due to muscular tightness from compensating for his rib injury.  Patient states that his pain is well controlled with Tylenol alone.  He was offered a muscle relaxer for further relief at home.  This was prescribed.  He was advised to take with caution for possible side effects of dizziness and increased risk of falls.  Patient was also advised to follow-up with a primary care doctor to assess for underlying osteoporosis and consideration of medications to minimize further bone density loss.  He was discharged in good condition.  Final Clinical Impression(s) /  ED Diagnoses Final diagnoses:  Rib pain on right side    Rx / DC Orders ED Discharge Orders          Ordered    methocarbamol (ROBAXIN) 500 MG tablet  Every 12 hours PRN        03/29/21 2106             Godfrey Pick, MD 03/30/21 1417

## 2021-03-29 NOTE — ED Triage Notes (Signed)
Pt c/o left sided rib pain after leaning over the arm the chair to reach for something. Pt reports pain felt in left back area as well.

## 2021-04-01 ENCOUNTER — Ambulatory Visit: Payer: Medicare Other | Admitting: Physician Assistant

## 2021-04-01 ENCOUNTER — Encounter: Payer: Self-pay | Admitting: Physician Assistant

## 2021-04-01 ENCOUNTER — Other Ambulatory Visit: Payer: Self-pay

## 2021-04-01 VITALS — BP 118/72 | HR 73 | Ht 72.0 in | Wt 207.0 lb

## 2021-04-01 DIAGNOSIS — I255 Ischemic cardiomyopathy: Secondary | ICD-10-CM

## 2021-04-01 DIAGNOSIS — I472 Ventricular tachycardia, unspecified: Secondary | ICD-10-CM

## 2021-04-01 DIAGNOSIS — I5022 Chronic systolic (congestive) heart failure: Secondary | ICD-10-CM

## 2021-04-01 DIAGNOSIS — M7989 Other specified soft tissue disorders: Secondary | ICD-10-CM

## 2021-04-01 DIAGNOSIS — I25810 Atherosclerosis of coronary artery bypass graft(s) without angina pectoris: Secondary | ICD-10-CM | POA: Diagnosis not present

## 2021-04-01 DIAGNOSIS — I480 Paroxysmal atrial fibrillation: Secondary | ICD-10-CM

## 2021-04-01 DIAGNOSIS — I10 Essential (primary) hypertension: Secondary | ICD-10-CM

## 2021-04-01 DIAGNOSIS — J449 Chronic obstructive pulmonary disease, unspecified: Secondary | ICD-10-CM

## 2021-04-01 LAB — CBC
Hematocrit: 35.4 % — ABNORMAL LOW (ref 37.5–51.0)
Hemoglobin: 12 g/dL — ABNORMAL LOW (ref 13.0–17.7)
MCH: 33.9 pg — ABNORMAL HIGH (ref 26.6–33.0)
MCHC: 33.9 g/dL (ref 31.5–35.7)
MCV: 100 fL — ABNORMAL HIGH (ref 79–97)
NRBC: 1 % — ABNORMAL HIGH (ref 0–0)
Platelets: 151 10*3/uL (ref 150–450)
RBC: 3.54 x10E6/uL — ABNORMAL LOW (ref 4.14–5.80)
RDW: 14.7 % (ref 11.6–15.4)
WBC: 4.1 10*3/uL (ref 3.4–10.8)

## 2021-04-01 LAB — COMPREHENSIVE METABOLIC PANEL
ALT: 60 IU/L — ABNORMAL HIGH (ref 0–44)
AST: 62 IU/L — ABNORMAL HIGH (ref 0–40)
Albumin/Globulin Ratio: 1.8 (ref 1.2–2.2)
Albumin: 4.2 g/dL (ref 3.6–4.6)
Alkaline Phosphatase: 52 IU/L (ref 44–121)
BUN/Creatinine Ratio: 12 (ref 10–24)
BUN: 12 mg/dL (ref 8–27)
Bilirubin Total: 0.6 mg/dL (ref 0.0–1.2)
CO2: 26 mmol/L (ref 20–29)
Calcium: 9.5 mg/dL (ref 8.6–10.2)
Chloride: 102 mmol/L (ref 96–106)
Creatinine, Ser: 1.01 mg/dL (ref 0.76–1.27)
Globulin, Total: 2.3 g/dL (ref 1.5–4.5)
Glucose: 95 mg/dL (ref 70–99)
Potassium: 4.6 mmol/L (ref 3.5–5.2)
Sodium: 139 mmol/L (ref 134–144)
Total Protein: 6.5 g/dL (ref 6.0–8.5)
eGFR: 74 mL/min/{1.73_m2} (ref >59)

## 2021-04-01 LAB — LIPID PANEL
Chol/HDL Ratio: 3.8 ratio (ref 0.0–5.0)
Cholesterol, Total: 195 mg/dL (ref 100–199)
HDL: 52 mg/dL (ref >39)
LDL Chol Calc (NIH): 129 mg/dL — ABNORMAL HIGH (ref 0–99)
Triglycerides: 78 mg/dL (ref 0–149)
VLDL Cholesterol Cal: 14 mg/dL (ref 5–40)

## 2021-04-01 LAB — TSH: TSH: 2.36 u[IU]/mL (ref 0.450–4.500)

## 2021-04-01 MED ORDER — POTASSIUM CHLORIDE CRYS ER 20 MEQ PO TBCR: EXTENDED_RELEASE_TABLET | ORAL | 3 refills | Status: DC

## 2021-04-01 MED ORDER — FUROSEMIDE 40 MG PO TABS: ORAL_TABLET | ORAL | 3 refills | Status: DC

## 2021-04-01 NOTE — Patient Instructions (Addendum)
Medication Instructions:  Your physician has recommended you make the following change in your medication:  INCREASE LASIX TO 40 MG EVERY OTHER DAY AND 20 MG ON THE ALTERNATIVE DAYS.  INCREASE POTASSIUM TO 40 MG EVERY OTHER DAY AND 20 MG ON THE ALTERNATIVE DAYS.  *If you need a refill on your cardiac medications before your next appointment, please call your pharmacy*  Lab Work: TODAY: CMET, CBC, FASTING LIPID PANEL, TSH  TO BE DONE SAME DAY AS ECHOCARDIOGRAM: BMET If you have labs (blood work) drawn today and your tests are completely normal, you will receive your results only by: [place] (if you have MyChart) OR A paper copy in the mail If you have any lab test that is abnormal or we need to change your treatment, we will call you to review the results.   Testing/Procedures: Your physician has requested that you have an echocardiogram. Echocardiography is a painless test that uses sound waves to create images of your heart. It provides your doctor with information about the size and shape of your heart and how well your hearts chambers and valves are working. This procedure takes approximately one hour. There are no restrictions for this procedure.   Follow-Up: At [facility], you and your health needs are our priority.  As part of our continuing mission to provide you with exceptional heart care, we have created designated Provider Care Teams.  These Care Teams include your primary Cardiologist (physician) and Advanced Practice Providers (APPs -  Physician Assistants and Nurse Practitioners) who all work together to provide you with the care you need, when you need it.  We recommend signing up for the patient portal called "MyChart".  Sign up information is provided on this After Visit Summary.  MyChart is used to connect with patients for Virtual Visits (Telemedicine).  Patients are able to view lab/test results, encounter notes, upcoming appointments, etc.  Non-urgent  messages can be sent to your provider as well.   To learn more about what you can do with MyChart, go to [email or web address].    Your next appointment:    AFTER THE ECHO IS COMPLETE  Provider:   Sinclair Grooms, MD or Ermalinda Barrios, PA-C  Other Instructions  Echocardiogram An echocardiogram is a test that uses sound waves (ultrasound) to produce images of the heart. Images from an echocardiogram can provide important information about: Heart size and shape. The size and thickness and movement of your heart's walls. Heart muscle function and strength. Heart valve function or if you have stenosis. Stenosis is when the heart valves are too narrow. If blood is flowing backward through the heart valves (regurgitation). A tumor or infectious growth around the heart valves. Areas of heart muscle that are not working well because of poor blood flow or injury from a heart attack. Aneurysm detection. An aneurysm is a weak or damaged part of an artery wall. The wall bulges out from the normal force of blood pumping through the body. Tell a health care provider about: Any allergies you have. All medicines you are taking, including vitamins, herbs, eye drops, creams, and over-the-counter medicines. Any blood disorders you have. Any surgeries you have had. Any medical conditions you have. Whether you are pregnant or may be pregnant. What are the risks? Generally, this is a safe test. However, problems may occur, including an allergic reaction to dye (contrast) that may be used during the test. What happens before the test? No specific preparation is needed. You may eat  and drink normally. What happens during the test?  You will take off your clothes from the waist up and put on a hospital gown. Electrodes or electrocardiogram (ECG)patches may be placed on your chest. The electrodes or patches are then connected to a device that monitors your heart rate and rhythm. You will lie down on a  table for an ultrasound exam. A gel will be applied to your chest to help sound waves pass through your skin. A handheld device, called a transducer, will be pressed against your chest and moved over your heart. The transducer produces sound waves that travel to your heart and bounce back (or "echo" back) to the transducer. These sound waves will be captured in real-time and changed into images of your heart that can be viewed on a video monitor. The images will be recorded on a computer and reviewed by your health care provider. You may be asked to change positions or hold your breath for a short time. This makes it easier to get different views or better views of your heart. In some cases, you may receive contrast through an IV in one of your veins. This can improve the quality of the pictures from your heart. The procedure may vary among health care providers and hospitals. What can I expect after the test? You may return to your normal, everyday life, including diet, activities, and medicines, unless your health care provider tells you not to do that. Follow these instructions at home: It is up to you to get the results of your test. Ask your health care provider, or the department that is doing the test, when your results will be ready. Keep all follow-up visits. This is important. Summary An echocardiogram is a test that uses sound waves (ultrasound) to produce images of the heart. Images from an echocardiogram can provide important information about the size and shape of your heart, heart muscle function, heart valve function, and other possible heart problems. You do not need to do anything to prepare before this test. You may eat and drink normally. After the echocardiogram is completed, you may return to your normal, everyday life, unless your health care provider tells you not to do that. This information is not intended to replace advice given to you by your health care provider. Make sure  you discuss any questions you have with your health care provider. Document Revised: 11/27/2020 Document Reviewed: 11/07/2019 Elsevier Patient Education  [place].   Two Gram Sodium Diet 2000 mg  What is Sodium? Sodium is a mineral found naturally in many foods. The most significant source of sodium in the diet is table salt, which is about 40% sodium.  Processed, convenience, and preserved foods also contain a large amount of sodium.  The body needs only 500 mg of sodium daily to function,  A normal diet provides more than enough sodium even if you do not use salt.  Why Limit Sodium? A build up of sodium in the body can cause thirst, increased blood pressure, shortness of breath, and water retention.  Decreasing sodium in the diet can reduce edema and risk of heart attack or stroke associated with high blood pressure.  Keep in mind that there are many other factors involved in these health problems.  Heredity, obesity, lack of exercise, cigarette smoking, stress and what you eat all play a role.  General Guidelines: Do not add salt at the table or in cooking.  One teaspoon of salt contains over 2 grams  of sodium. Read food labels Avoid processed and convenience foods Ask your dietitian before eating any foods not dicussed in the menu planning guidelines Consult your physician if you wish to use a salt substitute or a sodium containing medication such as antacids.  Limit milk and milk products to 16 oz (2 cups) per day.  Shopping Hints: READ LABELS!! "Dietetic" does not necessarily mean low sodium. Salt and other sodium ingredients are often added to foods during processing.    Menu Planning Guidelines Food Group Choose More Often Avoid  Beverages (see also the milk group All fruit juices, low-sodium, salt-free vegetables juices, low-sodium carbonated beverages Regular vegetable or tomato juices, commercially softened water used for drinking or cooking  Breads and Cereals  Enriched white, wheat, rye and pumpernickel bread, hard rolls and dinner rolls; muffins, cornbread and waffles; most dry cereals, cooked cereal without added salt; unsalted crackers and breadsticks; low sodium or homemade bread crumbs Bread, rolls and crackers with salted tops; quick breads; instant hot cereals; pancakes; commercial bread stuffing; self-rising flower and biscuit mixes; regular bread crumbs or cracker crumbs  Desserts and Sweets Desserts and sweets mad with mild should be within allowance Instant pudding mixes and cake mixes  Fats Butter or margarine; vegetable oils; unsalted salad dressings, regular salad dressings limited to 1 Tbs; light, sour and heavy cream Regular salad dressings containing bacon fat, bacon bits, and salt pork; snack dips made with instant soup mixes or processed cheese; salted nuts  Fruits Most fresh, frozen and canned fruits Fruits processed with salt or sodium-containing ingredient (some dried fruits are processed with sodium sulfites        Vegetables Fresh, frozen vegetables and low- sodium canned vegetables Regular canned vegetables, sauerkraut, pickled vegetables, and others prepared in brine; frozen vegetables in sauces; vegetables seasoned with ham, bacon or salt pork  Condiments, Sauces, Miscellaneous  Salt substitute with physician's approval; pepper, herbs, spices; vinegar, lemon or lime juice; hot pepper sauce; garlic powder, onion powder, low sodium soy sauce (1 Tbs.); low sodium condiments (ketchup, chili sauce, mustard) in limited amounts (1 tsp.) fresh ground horseradish; unsalted tortilla chips, pretzels, potato chips, popcorn, salsa (1/4 cup) Any seasoning made with salt including garlic salt, celery salt, onion salt, and seasoned salt; sea salt, rock salt, kosher salt; meat tenderizers; monosodium glutamate; mustard, regular soy sauce, barbecue, sauce, chili sauce, teriyaki sauce, steak sauce, Worcestershire sauce, and most flavored vinegars;  canned gravy and mixes; regular condiments; salted snack foods, olives, picles, relish, horseradish sauce, catsup   Food preparation: Try these seasonings Meats:    Pork Sage, onion Serve with applesauce  Chicken Poultry seasoning, thyme, parsley Serve with cranberry sauce  Lamb Curry powder, rosemary, garlic, thyme Serve with mint sauce or jelly  Veal Marjoram, basil Serve with current jelly, cranberry sauce  Beef Pepper, bay leaf Serve with dry mustard, unsalted chive butter  Fish [place] leaf, dill Serve with unsalted lemon butter, unsalted parsley butter  Vegetables:    Asparagus Lemon juice   Broccoli Lemon juice   Carrots Mustard dressing parsley, mint, nutmeg, glazed with unsalted butter and sugar   Green beans Marjoram, lemon juice, nutmeg,dill seed   Tomatoes [name], marjoram, onion   Spice /blend for [employer]" 4 tsp ground thyme 1 tsp ground sage 3 tsp ground rosemary 4 tsp ground marjoram   Test your knowledge A product that says "Salt Free" may still contain sodium. True or False Garlic Powder and Hot Pepper Sauce an be used as alternative seasonings.True  or False Processed foods have more sodium than fresh foods.  True or False Canned Vegetables have less sodium than froze True or False   WAYS TO DECREASE YOUR SODIUM INTAKE Avoid the use of added salt in cooking and at the table.  Table salt (and other prepared seasonings which contain salt) is probably one of the greatest sources of sodium in the diet.  Unsalted foods can gain flavor from the sweet, sour, and butter taste sensations of herbs and spices.  Instead of using salt for seasoning, try the following seasonings with the foods listed.  Remember: how you use them to enhance natural food flavors is limited only by your creativity... Allspice-Meat, fish, eggs, fruit, peas, red and yellow vegetables Almond Extract-Fruit baked goods Anise Seed-Sweet breads, fruit, carrots, beets, cottage cheese, cookies (tastes like  licorice) [name]-Meat, fish, eggs, vegetables, rice, vegetables salads, soups, sauces Bay Leaf-Meat, fish, stews, poultry Burnet-Salad, vegetables (cucumber-like flavor) Caraway Seed-Bread, cookies, cottage cheese, meat, vegetables, cheese, rice Cardamon-Baked goods, fruit, soups Celery Powder or seed-Salads, salad dressings, sauces, meatloaf, soup, bread.Do not use  celery salt Chervil-Meats, salads, fish, eggs, vegetables, cottage cheese (parsley-like flavor) Chili Power-Meatloaf, chicken cheese, corn, eggplant, egg dishes Chives-Salads cottage cheese, egg dishes, soups, vegetables, sauces Cilantro-Salsa, casseroles Cinnamon-Baked goods, fruit, pork, lamb, chicken, carrots Cloves-Fruit, baked goods, fish, pot roast, green beans, beets, carrots Coriander-Pastry, cookies, meat, salads, cheese (lemon-orange flavor) Cumin-Meatloaf, fish,cheese, eggs, cabbage,fruit pie (caraway flavor) [employer], fruit, eggs, fish, poultry, cottage cheese, vegetables Dill Seed-Meat, cottage cheese, poultry, vegetables, fish, salads, bread Fennel Seed-Bread, cookies, apples, pork, eggs, fish, beets, cabbage, cheese, Licorice-like flavor Garlic-(buds or powder) Salads, meat, poultry, fish, bread, butter, vegetables, potatoes.Do not  use garlic salt Ginger-Fruit, vegetables, baked goods, meat, fish, poultry Horseradish Root-Meet, vegetables, butter Lemon Juice or Extract-Vegetables, fruit, tea, baked goods, fish salads Mace-Baked goods fruit, vegetables, fish, poultry (taste like nutmeg) Maple Extract-Syrups Marjoram-Meat, chicken, fish, vegetables, breads, green salads (taste like Sage) Mint-Tea, lamb, sherbet, vegetables, desserts, carrots, cabbage Mustard, Dry or Seed-Cheese, eggs, meats, vegetables, poultry Nutmeg-Baked goods, fruit, chicken, eggs, vegetables, desserts Onion Powder-Meat, fish, poultry, vegetables, cheese, eggs, bread, rice salads (Do not use   Onion salt) Orange Extract-Desserts,  baked goods Oregano-Pasta, eggs, cheese, onions, pork, lamb, fish, chicken, vegetables, green salads Paprika-Meat, fish, poultry, eggs, cheese, vegetables Parsley Flakes-Butter, vegetables, meat fish, poultry, eggs, bread, salads (certain forms may   Contain sodium Pepper-Meat fish, poultry, vegetables, eggs Peppermint Extract-Desserts, baked goods Poppy Seed-Eggs, bread, cheese, fruit dressings, baked goods, noodles, vegetables, cottage  [employer], poultry, meat, fish, cauliflower, turnips,eggs bread Saffron-Rice, bread, veal, chicken, fish, eggs Sage-Meat, fish, poultry, onions, eggplant, tomateos, pork, stews Savory-Eggs, salads, poultry, meat, rice, vegetables, soups, pork Tarragon-Meat, poultry, fish, eggs, butter, vegetables (licorice-like flavor)  Thyme-Meat, poultry, fish, eggs, vegetables, (clover-like flavor), sauces, soups Tumeric-Salads, butter, eggs, fish, rice, vegetables (saffron-like flavor) Vanilla Extract-Baked goods, candy Vinegar-Salads, vegetables, meat marinades Walnut Extract-baked goods, candy   2. Choose your Foods Wisely   The following is a list of foods to avoid which are high in sodium:  Meats-Avoid all smoked, canned, salt cured, dried and kosher meat and fish as well as Anchovies   [employer] meats:Bologna, Liverwurst, Pastrami Canned meat or fish  Marinated herring Caviar    Pepperoni Corned Beef   Pizza Dried chipped beef  Salami Frozen breaded fish or meat Salt pork Frankfurters or hot dogs  Sardines Gefilte fish   Sausage Ham (boiled ham, Proscuitto Smoked butt  spiced ham)   Spam      TV Dinners Vegetables Canned vegetables (Regular) Relish Canned mushrooms  Sauerkraut Olives    Tomato juice Pickles  Bakery and Dessert Products Canned puddings  Cream pies Cheesecake   Decorated cakes Cookies  Beverages/Juices Tomato juice, regular  Gatorade   V-8 vegetable juice,  regular  Breads and Cereals Biscuit mixes   Salted potato chips, corn chips, pretzels Bread stuffing mixes  Salted crackers and rolls Pancake and waffle mixes Self-rising flour  Seasonings Accent    Meat sauces Barbecue sauce  Meat tenderizer Catsup    Monosodium glutamate (MSG) Celery salt   Onion salt Chili sauce   Prepared mustard Garlic salt   Salt, seasoned salt, sea salt Gravy mixes   Soy sauce Horseradish   Steak sauce Ketchup   Tartar sauce Lite salt    Teriyaki sauce Marinade mixes   Worcestershire sauce  Others Baking powder   Cocoa and cocoa mixes Baking soda   Commercial casserole mixes Candy-caramels, chocolate  Dehydrated soups    Bars, fudge,nougats  Instant rice and pasta mixes Canned broth or soup  Maraschino cherries Cheese, aged and processed cheese and cheese spreads  Learning Assessment Quiz  Indicated T (for True) or F (for False) for each of the following statements:  _____ Fresh fruits and vegetables and unprocessed grains are generally low in sodium _____ Water may contain a considerable amount of sodium, depending on the source _____ You can always tell if a food is high in sodium by tasting it _____ Certain laxatives my be high in sodium and should be avoided unless prescribed   by a physician or pharmacist _____ Salt substitutes may be used freely by anyone on a sodium restricted diet _____ Sodium is present in table salt, food additives and as a natural component of   most foods _____ Table salt is approximately 90% sodium _____ Limiting sodium intake may help prevent excess fluid accumulation in the body _____ On a sodium-restricted diet, seasonings such as bouillon soy sauce, and    cooking wine should be used in place of table salt _____ On an ingredient list, a product which lists monosodium glutamate as the first   ingredient is an appropriate food to include on a low sodium diet  Circle the best answer(s) to the following statements (Hint:  there may be more than one correct answer)  11. On a low-sodium diet, some acceptable snack items are:    A. Olives  F. Bean dip   K. Grapefruit juice    B. Salted Pretzels G. Commercial Popcorn   L. Canned peaches    C. Carrot Sticks  H. Bouillon   M. Unsalted nuts   D. [place] fries  I. Peanut butter crackers N. Salami   E. Sweet pickles J. Tomato Juice   O. Pizza  12.  Seasonings that may be used freely on a reduced - sodium diet include   A. Lemon wedges F.Monosodium glutamate K. Celery seed    B.Soysauce   G. Pepper   L. Mustard powder   C. Sea salt  H. Cooking wine  M. Onion flakes   D. Vinegar  E. Prepared horseradish N. Salsa   E. Sage   J. Worcestershire sauce  O. Chutney

## 2021-04-03 ENCOUNTER — Other Ambulatory Visit: Payer: Self-pay

## 2021-04-03 ENCOUNTER — Emergency Department
Admission: EM | Admit: 2021-04-03 | Discharge: 2021-04-03 | Disposition: A | Discharge status: Home / Self Care | Payer: Medicare Other | Source: Home / Self Care

## 2021-04-03 DIAGNOSIS — T148XXA Other injury of unspecified body region, initial encounter: Secondary | ICD-10-CM

## 2021-04-03 MED ORDER — AMOXICILLIN-POT CLAVULANATE 875-125 MG PO TABS: 1.0000 | ORAL_TABLET | Freq: Two times a day (BID) | ORAL | 0 refills | Status: DC

## 2021-04-03 NOTE — Discharge Instructions (Addendum)
Encouraged patient to leave affected areas of right knee open to air to allow skin to heal by secondary intention/allow scab formation.  Advised patient to take medication as directed with food to completion.  Encouraged patient increase daily water intake while taking this medication.

## 2021-04-03 NOTE — ED Triage Notes (Signed)
Pt presents with rt knee skin tear that occurred yesterday after a fall. Pt denies any pain

## 2021-04-03 NOTE — ED Provider Notes (Signed)
[name]    CSN: [id number] Arrival date & time: 04/03/21  1507      History   Chief Complaint Chief Complaint  Patient presents with   skin tear    Rt knee    HPI Steven Guzman is a 81 y.o. male.   HPI 81 year old male presents with a right knee skin tear that occurred yesterday after falling.  Patient denies pain in this area.  PMH significant for CAD, chronic systolic heart failure s/p ICD, and A. fib.  Patient is currently on apixaban and denies any unusual bleeding.  Past Medical History:  Diagnosis Date   AICD (automatic cardioverter/defibrillator) present    Aneurysm ([place])    a. Aneurysmal infrarenal aorta up to 33 mm on CT 10/2014, recommended f/u due 10/2017   Anginal pain ([place])    Anxiety    Basal cell carcinoma of nose    S/P MOHS   Biliary acute pancreatitis    CAD (coronary artery disease)    a. s/p MI in 1994 with PCI to LAD at that time b. cath 10/2012 demonstrated EF 30%, inferior akinesis with mild hypokinesis of all walls, patent LAD and RCA stents; ostial PDA with 80-90% obstruction with medical therapy recommended    Chronic systolic CHF (congestive heart failure) (HCC)    EF 30 to 35 % as of 09/2014.    CKD (chronic kidney disease), stage III ([place])    Complication of anesthesia 10/2014   "had to have defibrillator w/ERCP"   COPD (chronic obstructive pulmonary disease) ([place])    a. followed by pulmonary, COPD GOLD stage II   Depression    Diverticulosis of colon 07/2014   noted on CT   GERD (gastroesophageal reflux disease)    Hiatal hernia    Hyperglycemia 10/2012.   Hyperlipidemia    Hypertension    Myocardial infarction ([place]) 1994; 2011   Pneumonia 1946; 2015   Prostate enlargement 07/2014   observed on CT   Tobacco abuse    Ventricular tachycardia    a. 08/2009 s/p BSX E110 Teligen 100 AICD, ser#: [id number];  b. 08/2008 VT req ATP - detection reprogrammed from 160 to 150. c. EPS and VT ablation by Dr. Lovena Le 12/21/2014    Patient  Active Problem List   Diagnosis Date Noted   Major depressive disorder with single episode, in partial remission ([place]) 03/05/2020   Paraseptal emphysema ([place]) 12/15/2018   Tobacco abuse 12/15/2018   Perianal abscess 11/15/2018   Left inguinal hernia 11/15/2018   Leg wound, right 11/15/2018   Weight loss 09/29/2018   Dark stools 09/29/2018   Lumbar trigger point syndrome 04/20/2018   Throat and mouth symptom 02/03/2018   Gastroesophageal reflux disease without esophagitis    Esophageal stricture    Gastritis and gastroduodenitis    Degenerative cervical spinal stenosis 10/27/2017   Carotid bruit 10/27/2017   B12 deficiency 08/25/2017   Anemia 06/10/2017   Right ankle pain 04/09/2017   BPH (benign prostatic hyperplasia) 12/31/2016   Dizzinesses 05/22/2016   Mass of throat 06/30/2015   Depression 05/24/2015   Atrial fibrillation ([place]) 03/15/2015   Routine general medical examination at a health care facility 05/12/2014   Hemoptysis 03/16/2014   COPD GOLD GRADE C 70/78/6754   Chronic systolic heart failure ([place]) 07/07/2013   CAD (coronary artery disease) 11/23/2012   Smokers' cough ([place]) 11/23/2012   Ventricular tachycardia 11/23/2012   Essential hypertension 11/23/2012   Hyperlipidemia 11/23/2012   ICD (implantable cardioverter-defibrillator) in place 11/23/2012  Past Surgical History:  Procedure Laterality Date   BIOPSY  12/21/2017   Procedure: BIOPSY;  Surgeon: [name], MD;  Location: [facility];  Service: Endoscopy;;   CATARACT EXTRACTION W/ INTRAOCULAR LENS  IMPLANT, BILATERAL Bilateral ~ 2011   COLONOSCOPY     COLONOSCOPY WITH PROPOFOL N/A 12/21/2017   Procedure: COLONOSCOPY WITH PROPOFOL;  Surgeon: [name], MD;  Location: [facility];  Service: Endoscopy;  Laterality: N/A;   ELECTROPHYSIOLOGIC STUDY N/A 12/21/2014   Procedure: V Tach Ablation;  Surgeon: [name], MD;  Location: [facility];  Service: Cardiovascular;  Laterality: N/A;   ERCP  N/A 11/16/2014   Procedure: ENDOSCOPIC RETROGRADE CHOLANGIOPANCREATOGRAPHY (ERCP);  Surgeon: [name], MD;  Location: [facility];  Service: Endoscopy;  Laterality: N/A;   ESOPHAGOGASTRODUODENOSCOPY (EGD) WITH PROPOFOL N/A 12/21/2017   Procedure: ESOPHAGOGASTRODUODENOSCOPY (EGD) WITH PROPOFOL;  Surgeon: [name], MD;  [facility];  Service: Endoscopy;  Laterality: N/A;   EYE SURGERY     FOOT SURGERY Left 2005   "fixed bone that stuck out in my ankle area"   HEMORRHOID BANDING     IMPLANTABLE CARDIOVERTER DEFIBRILLATOR IMPLANT  09/06/09   BSX dual chamber ICD implanted in [place] for cardiac arrest 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   [place] Right ~ [place] N/A 11/25/2012   demonstrated EF 30%, inferior akinesis with mild hypokinesis of all walls, patent LAD and RCA stents; ostial PDA with 80-90% obstruction with medical therapy recommended   MOHS SURGERY  2008   nose, skin graft   POLYPECTOMY  12/21/2017   Procedure: POLYPECTOMY;  Surgeon: [name], MD;  [facility];  Service: Endoscopy;;   RETINAL DETACHMENT SURGERY Right 2013   TENOLYSIS Right 12/21/2013   Procedure: TENOLYSIS FLEXOR CARPI RADIALIS ,DEBRIDEMENT RIGHT JOINT WRIST,DEBRIDEMENT SCAPHOTRAPEZIAL TRAPEZOID, REPAIR OF EXTENSOR HOOD;  Surgeon: Daryll Brod, MD;  Location: [facility];  Service: Orthopedics;  Laterality: Right;   TOE SURGERY Right 09/2019   3rd toe/hammer toe   V-TACH ABLATION  12/21/2014   VIDEO BRONCHOSCOPY Bilateral 01/09/2016   Procedure: VIDEO BRONCHOSCOPY WITHOUT FLUORO;  Surgeon: Juanito Doom, MD;  [facility];  Service: Cardiopulmonary;  Laterality: Bilateral;       Home Medications    Prior to Admission medications   Medication Sig Start Date End Date Taking? Authorizing Provider  amoxicillin-clavulanate (AUGMENTIN) 875-125 MG tablet Take 1 tablet by mouth every 12 (twelve) hours.  04/03/21  Yes Eliezer Lofts, FNP  albuterol (PROVENTIL) (2.5 MG/3ML) 0.083% nebulizer solution USE 1 VIAL VIA NEBULIZER EVERY 6 HOURS AS NEEDED FOR WHEEZING OR SHORTNESS OF BREATH 07/18/19   Olalere, Cicero Duck A, MD  amiodarone (PACERONE) 200 MG tablet Take 1 tablet (200 mg total) by mouth daily. 11/22/20   Belva Crome, MD  AMOXICILLIN PO Take 1 tablet by mouth every 12 (twelve) hours. Patient not taking: Reported on 04/03/2021    [provider]  apixaban (ELIQUIS) 5 MG TABS tablet Take 1 tablet (5 mg total) by mouth 2 (two) times daily. 03/26/21   Belva Crome, MD  Artificial Tear Solution (SOOTHE XP OP) Place 2 drops into both eyes daily as needed (dry eyes).    [provider]  benazepril (LOTENSIN) 10 MG tablet Take 1 tablet (10 mg total) by mouth daily. 11/22/20   Belva Crome, MD  budesonide-formoterol [facility]) 160-4.5 MCG/ACT inhaler Inhale 2 puffs into the lungs 2 (  two) times daily. Patient not taking: Reported on 04/03/2021 05/10/20   Yvonna Alanis, NP  buPROPion (WELLBUTRIN) 75 MG tablet Take 1 tablet (75 mg total) by mouth daily. 11/29/20   Fargo, Amy E, NP  busPIRone (BUSPAR) 15 MG tablet TAKE 1 TABLET BY MOUTH TWICE A DAY 09/16/20   Fargo, Amy E, NP  carvedilol (COREG) 3.125 MG tablet Take 1 tablet (3.125 mg total) by mouth 2 (two) times daily. 11/22/20   Belva Crome, MD  cetirizine (ZYRTEC) 10 MG tablet Take 1 tablet by mouth daily as needed. 12/16/17   [provider]  Choline Fenofibrate (FENOFIBRIC ACID) 135 MG CPDR TAKE ONE CAPSULE BY MOUTH DAILY 03/11/21   Belva Crome, MD  Cyanocobalamin (VITAMIN B-12) 5000 MCG SUBL Place under the tongue daily.    [provider]  escitalopram (LEXAPRO) 20 MG tablet Take 1 tablet (20 mg total) by mouth daily. 02/16/21   Fargo, Amy E, NP  finasteride (PROSCAR) 5 MG tablet Take 5 mg by mouth daily. 06/24/20   [provider]  fluticasone (FLONASE) 50 MCG/ACT nasal spray Place 2 sprays into both nostrils  daily. 02/13/21   Fargo, Amy E, NP  Fluticasone-Umeclidin-Vilant (TRELEGY ELLIPTA) 200-62.5-25 MCG/ACT AEPB Inhale 1 puff into the lungs daily.    [provider]  furosemide (LASIX) 40 MG tablet TAKE 40 MG EVERY OTHER DAY AND 20 MG ON THE ALTERNATIVE DAYS 04/01/21   Imogene Burn, PA-C  gabapentin (NEURONTIN) 300 MG capsule Take 1 capsule (300 mg total) by mouth 3 (three) times daily. 12/23/20   Fargo, Amy E, NP  Ipratropium-Albuterol (COMBIVENT RESPIMAT) 20-100 MCG/ACT AERS respimat Inhale 1 puff into the lungs every 6 (six) hours. Shortness of breath or wheezing 05/10/20   Fargo, Amy E, NP  IRON PO Take by mouth daily.     [provider]  MAGNESIUM-OXIDE 400 (241.3 Mg) MG tablet TAKE 1 TABSULE BY MOUTH DAILY 11/24/18   Hoyt Koch, MD  methocarbamol (ROBAXIN) 500 MG tablet Take 1 tablet (500 mg total) by mouth every 12 (twelve) hours as needed for up to 5 days for muscle spasms. 03/29/21 04/03/21  Godfrey Pick, MD  mexiletine (MEXITIL) 200 MG capsule Take 1 capsule (200 mg total) by mouth 2 (two) times daily. 03/17/21   Belva Crome, MD  Multiple Vitamins-Minerals (CENTRUM ADULTS PO) Take by mouth daily.    [provider]  nitroGLYCERIN (NITROSTAT) 0.4 MG SL tablet DISSOLVE 1 TABLET UNDER THE TONGUE EVERY 5 MINUTES FOR 3 DOSES AS NEEDED FOR CHEST PAIN 11/22/20   Belva Crome, MD  omeprazole (PRILOSEC) 20 MG capsule Take 20 mg by mouth daily.     [provider]  potassium chloride SA (KLOR-CON M20) 20 MEQ tablet TAKE 40 MG EVERY OTHER DAY AND 20 MG ON THE ALTERNATIVE DAYS. 04/01/21   Imogene Burn, PA-C  Respiratory Therapy Supplies (FLUTTER) DEVI Use as directed 03/16/17   Juanito Doom, MD  Sennosides-Docusate Sodium (STOOL SOFTENER/LAXATIVE PO) Take 2 tablets by mouth daily.    [provider]  Spacer/Aero-Holding Chambers [facility] DIAMOND) [place] optichamber [facility] 09/12/19   Olalere, Ernesto Rutherford, MD  tamsulosin (FLOMAX) 0.4 MG CAPS  capsule Take one capsule by mouth once daily after supper. 12/30/20   Fargo, Amy E, NP  Tiotropium Bromide Monohydrate (SPIRIVA RESPIMAT) 2.5 MCG/ACT AERS Inhale 2 puffs into the lungs daily. Patient not taking: Reported on 04/03/2021 04/09/20   Yvonna Alanis, NP    Family  History Family History  Problem Relation Age of Onset   Heart attack Brother    CAD Father    Hypertension Father    CAD Mother    Hypertension Mother    Hypertension Brother    Stroke Neg Hx     Social History Social History   Tobacco Use   Smoking status: Former    Packs/day: 1.00    Years: 55.00    Pack years: 55.00    Types: Cigarettes   Smokeless tobacco: Never   Tobacco comments:    off/on, always ready to quit but does not work out  Scientific laboratory technician Use: Never used  Substance Use Topics   Alcohol use: Yes    Alcohol/week: 0.0 standard drinks    Comment: occ   Drug use: No     Allergies   Sulfa antibiotics   Review of Systems Review of Systems  HENT:  Positive for facial swelling.   Skin:  Positive for wound.    Physical Exam Triage Vital Signs ED Triage Vitals  Enc Vitals Group     BP 04/03/21 1516 138/85     Pulse Rate 04/03/21 1516 78     Resp 04/03/21 1516 14     Temp 04/03/21 1516 (!) 97.4 F (36.3 C)     Temp Source 04/03/21 1516 Oral     SpO2 04/03/21 1516 94 %     Weight 04/03/21 1518 206 lb (93.4 kg)     Height --      Head Circumference --      Peak Flow --      Pain Score 04/03/21 1518 0     Pain Loc --      Pain Edu? --      Excl. in [place]? --    No data found.  Updated Vital Signs BP 138/85 (BP Location: Left Arm)    Pulse 78    Temp (!) 97.4 F (36.3 C) (Oral)    Resp 14    Wt 206 lb (93.4 kg)    SpO2 94%    BMI 27.94 kg/m    Physical Exam Vitals and nursing note reviewed.  Constitutional:      General: He is not in acute distress.    Appearance: Normal appearance. He is normal weight. He is not ill-appearing.  HENT:     Head: Normocephalic and  atraumatic.     Mouth/Throat:     Mouth: Mucous membranes are moist.     Pharynx: Oropharynx is clear.  Eyes:     Extraocular Movements: Extraocular movements intact.     Conjunctiva/sclera: Conjunctivae normal.     Pupils: Pupils are equal, round, and reactive to light.  Cardiovascular:     Rate and Rhythm: Normal rate and regular rhythm.     Pulses: Normal pulses.     Heart sounds: Murmur heard.  Pulmonary:     Effort: Pulmonary effort is normal.     Breath sounds: Normal breath sounds.  Musculoskeletal:     Right lower leg: Edema present.     Left lower leg: No edema.  Skin:    General: Skin is warm and dry.     Comments: Right knee (anterior surface of patella): 5 cm x 5 cm oval-shaped well-healing skin avulsion with 15% eschar noted, nonerythematous, nonindurated, nonfluctuant, no lymphatic streaking  Neurological:     General: No focal deficit present.     Mental Status: He is alert and oriented to person, place,  and time.     UC Treatments / Results  Labs (all labs ordered are listed, but only abnormal results are displayed) Labs Reviewed - No data to display  EKG   Radiology No results found.  Procedures Procedures (including critical care time)  Medications Ordered in UC Medications - No data to display  Initial Impression / Assessment and Plan / UC Course  I have reviewed the triage vital signs and the nursing notes.  Pertinent labs & imaging results that were available during my care of the patient were reviewed by me and considered in my medical decision making (see chart for details).    MDM: 1.  Skin avulsion of right knee-Rx'd Augmentin-we will treat empirically to avoid cellulitis of this area. Encouraged patient to leave affected areas of right knee open to air to allow skin to heal by secondary intention/allow scab formation.  Advised patient to take medication as directed with food to completion.  Encouraged patient increase daily water intake  while taking this medication.  Patient discharged home, hemodynamically stable. Final Clinical Impressions(s) / UC Diagnoses   Final diagnoses:  Skin avulsion     Discharge Instructions      Encouraged patient to leave affected areas of right knee open to air to allow skin to heal by secondary intention/allow scab formation.  Advised patient to take medication as directed with food to completion.  Encouraged patient increase daily water intake while taking this medication.     ED Prescriptions     Medication Sig Dispense Auth. Provider   amoxicillin-clavulanate (AUGMENTIN) 875-125 MG tablet Take 1 tablet by mouth every 12 (twelve) hours. 14 tablet Eliezer Lofts, FNP      PDMP not reviewed this encounter.   Eliezer Lofts, [place] 04/03/21 [phone]

## 2021-04-04 ENCOUNTER — Other Ambulatory Visit: Payer: Self-pay

## 2021-04-04 DIAGNOSIS — E7849 Other hyperlipidemia: Secondary | ICD-10-CM

## 2021-04-15 DIAGNOSIS — L738 Other specified follicular disorders: Secondary | ICD-10-CM | POA: Insufficient documentation

## 2021-04-16 ENCOUNTER — Ambulatory Visit (HOSPITAL_COMMUNITY): Payer: Medicare Other | Attending: Cardiology

## 2021-04-16 ENCOUNTER — Other Ambulatory Visit: Payer: Medicare Other | Admitting: *Deleted

## 2021-04-16 ENCOUNTER — Other Ambulatory Visit: Payer: Self-pay

## 2021-04-16 DIAGNOSIS — I351 Nonrheumatic aortic (valve) insufficiency: Secondary | ICD-10-CM | POA: Diagnosis not present

## 2021-04-16 DIAGNOSIS — I472 Ventricular tachycardia, unspecified: Secondary | ICD-10-CM

## 2021-04-16 DIAGNOSIS — I110 Hypertensive heart disease with heart failure: Secondary | ICD-10-CM | POA: Diagnosis not present

## 2021-04-16 DIAGNOSIS — J449 Chronic obstructive pulmonary disease, unspecified: Secondary | ICD-10-CM | POA: Diagnosis not present

## 2021-04-16 DIAGNOSIS — I252 Old myocardial infarction: Secondary | ICD-10-CM | POA: Insufficient documentation

## 2021-04-16 DIAGNOSIS — R0609 Other forms of dyspnea: Secondary | ICD-10-CM

## 2021-04-16 DIAGNOSIS — R000 Tachycardia, unspecified: Secondary | ICD-10-CM | POA: Diagnosis not present

## 2021-04-16 DIAGNOSIS — I2510 Atherosclerotic heart disease of native coronary artery without angina pectoris: Secondary | ICD-10-CM | POA: Insufficient documentation

## 2021-04-16 DIAGNOSIS — M7989 Other specified soft tissue disorders: Secondary | ICD-10-CM | POA: Insufficient documentation

## 2021-04-16 DIAGNOSIS — Z95810 Presence of automatic (implantable) cardiac defibrillator: Secondary | ICD-10-CM | POA: Diagnosis not present

## 2021-04-16 DIAGNOSIS — I509 Heart failure, unspecified: Secondary | ICD-10-CM | POA: Insufficient documentation

## 2021-04-16 DIAGNOSIS — E785 Hyperlipidemia, unspecified: Secondary | ICD-10-CM | POA: Diagnosis not present

## 2021-04-16 DIAGNOSIS — I255 Ischemic cardiomyopathy: Secondary | ICD-10-CM

## 2021-04-16 DIAGNOSIS — R0600 Dyspnea, unspecified: Secondary | ICD-10-CM | POA: Insufficient documentation

## 2021-04-16 DIAGNOSIS — Z87891 Personal history of nicotine dependence: Secondary | ICD-10-CM | POA: Insufficient documentation

## 2021-04-16 DIAGNOSIS — Z8249 Family history of ischemic heart disease and other diseases of the circulatory system: Secondary | ICD-10-CM | POA: Insufficient documentation

## 2021-04-16 DIAGNOSIS — I5022 Chronic systolic (congestive) heart failure: Secondary | ICD-10-CM

## 2021-04-16 DIAGNOSIS — I480 Paroxysmal atrial fibrillation: Secondary | ICD-10-CM

## 2021-04-16 LAB — ECHOCARDIOGRAM COMPLETE
AR max vel: 3.12 cm2
AV Area VTI: 3.05 cm2
AV Area mean vel: 2.89 cm2
AV Mean grad: 7 mmHg
AV Peak grad: 13.2 mmHg
Ao pk vel: 1.81 m/s
Area-P 1/2: 2.89 cm2
P 1/2 time: 688 msec
S' Lateral: 4.95 cm

## 2021-04-17 LAB — BASIC METABOLIC PANEL
BUN/Creatinine Ratio: 11 (ref 10–24)
BUN: 11 mg/dL (ref 8–27)
CO2: 25 mmol/L (ref 20–29)
Calcium: 9.7 mg/dL (ref 8.6–10.2)
Chloride: 105 mmol/L (ref 96–106)
Creatinine, Ser: 1 mg/dL (ref 0.76–1.27)
Glucose: 91 mg/dL (ref 70–99)
Potassium: 4.5 mmol/L (ref 3.5–5.2)
Sodium: 144 mmol/L (ref 134–144)
eGFR: 75 mL/min/{1.73_m2} (ref >59)

## 2021-04-30 NOTE — Progress Notes (Signed)
Cardiology Office Note    Date:  05/07/2021   ID:  Steven, Guzman Dec 30, 1940, MRN [id number]   PCP:  Yvonna Alanis, NP   [place]  Cardiologist:  Sinclair Grooms, MD   Advanced Practice Provider:  No care team member to display Electrophysiologist:  Cristopher Peru, MD   [phone]   Chief Complaint  Patient presents with   Follow-up    History of Present Illness:  Steven Guzman is a 81 y.o. male with a hx of ischemic cardiomyopathy with EF 40-45% on echo 2018, COPD, hypertension, recent low blood pressures, CAD with prior coronary bypass grafting, tobacco abuse, PAF, ventricular tachycardia controlled on amiodarone and Mexitil, and implantable cardiac defibrillator. AAA infrarenal 3.4 cm 2019.   Last saw Dr. Tamala Julian 07/10/20 and quit smoking.   I saw the patient 04/01/21 and he was having had trouble with LEE-treated for cellulitis by PCP. [place] negative for DVT. He increased lasix for 3 days which helped. Does watch his salt. No chest pain, chronic DOE from COPD. No regular exercise but does housework and walks his dog short distances. Has seen Dr. Henrene Pastor in past for elevated LFT's. 10/2020 AST 46 ALT 51. Repeat Echo 04/16/21 EF 40-45%, grade 1 DD, mod dilation ascending aorta 44 mm(no aneurysm on chest CT 04/2020). For lung screening CT in Feb can be switched to contrast. I increased his lasix.  Patient comes in for f/u. Edema much better. No other cardiac complaints.       Past Medical History:  Diagnosis Date   AICD (automatic cardioverter/defibrillator) present    Aneurysm ([place])    a. Aneurysmal infrarenal aorta up to 33 mm on CT 10/2014, recommended f/u due 10/2017   Anginal pain ([place])    Anxiety    Basal cell carcinoma of nose    S/P MOHS   Biliary acute pancreatitis    CAD (coronary artery disease)    a. s/p MI in 1994 with PCI to LAD at that time b. cath 10/2012 demonstrated EF 30%, inferior akinesis with mild hypokinesis of all walls, patent  LAD and RCA stents; ostial PDA with 80-90% obstruction with medical therapy recommended    Chronic systolic CHF (congestive heart failure) (HCC)    EF 30 to 35 % as of 09/2014.    CKD (chronic kidney disease), stage III ([place])    Complication of anesthesia 10/2014   "had to have defibrillator w/ERCP"   COPD (chronic obstructive pulmonary disease) ([place])    a. followed by pulmonary, COPD GOLD stage II   Depression    Diverticulosis of colon 07/2014   noted on CT   GERD (gastroesophageal reflux disease)    Hiatal hernia    Hyperglycemia 10/2012.   Hyperlipidemia    Hypertension    Myocardial infarction [facility]) 1994; 2011   Pneumonia 1946; 2015   Prostate enlargement 07/2014   observed on CT   Tobacco abuse    Ventricular tachycardia    a. 08/2009 s/p BSX E110 Teligen 100 AICD, ser#: [id number];  b. 08/2008 VT req ATP - detection reprogrammed from 160 to 150. c. EPS and VT ablation by Dr. Lovena Le 12/21/2014    Past Surgical History:  Procedure Laterality Date   BIOPSY  12/21/2017   Procedure: BIOPSY;  Surgeon: [name], MD;  Location: [facility];  Service: Endoscopy;;   CATARACT EXTRACTION W/ INTRAOCULAR LENS  IMPLANT, BILATERAL Bilateral ~ 2011   COLONOSCOPY     COLONOSCOPY  WITH PROPOFOL N/A 12/21/2017   Procedure: COLONOSCOPY WITH PROPOFOL;  Surgeon: [name], MD;  [facility]: [facility];  Service: Endoscopy;  Laterality: N/A;   ELECTROPHYSIOLOGIC STUDY N/A 12/21/2014   Procedure: V Tach Ablation;  Surgeon: [name], MD;  Location: [place] [facility];  Service: Cardiovascular;  Laterality: N/A;   ERCP N/A 11/16/2014   Procedure: ENDOSCOPIC RETROGRADE CHOLANGIOPANCREATOGRAPHY (ERCP);  Surgeon: [name], MD;  Location: [place];  Service: Endoscopy;  Laterality: N/A;   ESOPHAGOGASTRODUODENOSCOPY (EGD) WITH PROPOFOL N/A 12/21/2017   Procedure: ESOPHAGOGASTRODUODENOSCOPY (EGD) WITH PROPOFOL;  Surgeon: [name], MD;  [facility];  Service: Endoscopy;   Laterality: N/A;   EYE SURGERY     FOOT SURGERY Left 2005   "fixed bone that stuck out in my ankle area"   HEMORRHOID BANDING     IMPLANTABLE CARDIOVERTER DEFIBRILLATOR IMPLANT  09/06/09   BSX dual chamber ICD implanted in [place] for cardiac arrest 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   [place] Right ~ [place] N/A 11/25/2012   demonstrated EF 30%, inferior akinesis with mild hypokinesis of all walls, patent LAD and RCA stents; ostial PDA with 80-90% obstruction with medical therapy recommended   MOHS SURGERY  2008   nose, skin graft   POLYPECTOMY  12/21/2017   Procedure: POLYPECTOMY;  Surgeon: [name], MD;  [facility];  Service: Endoscopy;;   RETINAL DETACHMENT SURGERY Right 2013   TENOLYSIS Right 12/21/2013   Procedure: TENOLYSIS FLEXOR CARPI RADIALIS ,DEBRIDEMENT RIGHT JOINT WRIST,DEBRIDEMENT SCAPHOTRAPEZIAL TRAPEZOID, REPAIR OF EXTENSOR HOOD;  Surgeon: Daryll Brod, MD;  Location: [place];  Service: Orthopedics;  Laterality: Right;   TOE SURGERY Right 09/2019   3rd toe/hammer toe   V-TACH ABLATION  12/21/2014   VIDEO BRONCHOSCOPY Bilateral 01/09/2016   Procedure: VIDEO BRONCHOSCOPY WITHOUT FLUORO;  Surgeon: Juanito Doom, MD;  [facility];  Service: Cardiopulmonary;  Laterality: Bilateral;    Current Medications: Current Meds  Medication Sig   albuterol (PROVENTIL) (2.5 MG/3ML) 0.083% nebulizer solution USE 1 VIAL VIA NEBULIZER EVERY 6 HOURS AS NEEDED FOR WHEEZING OR SHORTNESS OF BREATH   amiodarone (PACERONE) 200 MG tablet Take 1 tablet (200 mg total) by mouth daily.   amoxicillin-clavulanate (AUGMENTIN) 875-125 MG tablet Take 1 tablet by mouth every 12 (twelve) hours.   apixaban (ELIQUIS) 5 MG TABS tablet Take 1 tablet (5 mg total) by mouth 2 (two) times daily.   Artificial Tear Solution (SOOTHE XP OP) Place 2 drops into both eyes daily as needed (dry eyes).   benazepril (LOTENSIN)  10 MG tablet Take 1 tablet (10 mg total) by mouth daily.   buPROPion (WELLBUTRIN) 75 MG tablet Take 1 tablet (75 mg total) by mouth daily.   busPIRone (BUSPAR) 15 MG tablet TAKE 1 TABLET BY MOUTH TWICE A DAY   carvedilol (COREG) 3.125 MG tablet Take 1 tablet (3.125 mg total) by mouth 2 (two) times daily.   cetirizine (ZYRTEC) 10 MG tablet Take 1 tablet by mouth daily as needed.   Choline Fenofibrate (FENOFIBRIC ACID) 135 MG CPDR TAKE ONE CAPSULE BY MOUTH DAILY   Cyanocobalamin (VITAMIN B-12) 5000 MCG SUBL Place under the tongue daily.   escitalopram (LEXAPRO) 20 MG tablet Take 1 tablet (20 mg total) by mouth daily.   finasteride (PROSCAR) 5 MG tablet Take 5 mg by mouth daily.   fluticasone (FLONASE) 50 MCG/ACT nasal spray Place 2 sprays into both nostrils daily.  Fluticasone-Umeclidin-Vilant (TRELEGY ELLIPTA) 200-62.5-25 MCG/ACT AEPB Inhale 1 puff into the lungs daily.   furosemide (LASIX) 40 MG tablet TAKE 40 MG EVERY OTHER DAY AND 20 MG ON THE ALTERNATIVE DAYS   gabapentin (NEURONTIN) 300 MG capsule TAKE ONE CAPSULE BY MOUTH THREE TIMES A DAY   Ipratropium-Albuterol (COMBIVENT RESPIMAT) 20-100 MCG/ACT AERS respimat Inhale 1 puff into the lungs every 6 (six) hours. Shortness of breath or wheezing   IRON PO Take by mouth daily.    MAGNESIUM-OXIDE 400 (241.3 Mg) MG tablet TAKE 1 TABSULE BY MOUTH DAILY   mexiletine (MEXITIL) 200 MG capsule Take 1 capsule (200 mg total) by mouth 2 (two) times daily.   Multiple Vitamins-Minerals (CENTRUM ADULTS PO) Take by mouth daily.   nitroGLYCERIN (NITROSTAT) 0.4 MG SL tablet DISSOLVE 1 TABLET UNDER THE TONGUE EVERY 5 MINUTES FOR 3 DOSES AS NEEDED FOR CHEST PAIN   omeprazole (PRILOSEC) 20 MG capsule Take 20 mg by mouth daily.    potassium chloride SA (KLOR-CON M20) 20 MEQ tablet TAKE 40 MG EVERY OTHER DAY AND 20 MG ON THE ALTERNATIVE DAYS.   Respiratory Therapy Supplies (FLUTTER) DEVI Use as directed   Sennosides-Docusate Sodium (STOOL SOFTENER/LAXATIVE PO)  Take 2 tablets by mouth daily.   Spacer/Aero-Holding Chambers (OPTICHAMBER DIAMOND) MISC optichamber VHC   tamsulosin (FLOMAX) 0.4 MG CAPS capsule Take one capsule by mouth once daily after supper.     Allergies:   Sulfa antibiotics   Social History   Socioeconomic History   Marital status: Married    Spouse name: Not on file   Number of children: Not on file   Years of education: Not on file   Highest education level: Not on file  Occupational History   Occupation: Retired  Tobacco Use   Smoking status: Former    Packs/day: 1.00    Years: 55.00    Pack years: 55.00    Types: Cigarettes   Smokeless tobacco: Never   Tobacco comments:    off/on, always ready to quit but does not work out  Scientific laboratory technician Use: Never used  Substance and Sexual Activity   Alcohol use: Yes    Alcohol/week: 0.0 standard drinks    Comment: occ   Drug use: No   Sexual activity: Not Currently  Other Topics Concern   Not on file  Social History Narrative   Not on file   Social Determinants of Health   Financial Resource Strain: Medium Risk   Difficulty of Paying Living Expenses: Somewhat hard  Food Insecurity: Food Insecurity Present   Worried About Running Out of Food in the Last Year: Sometimes true   Ran Out of Food in the Last Year: Never true  Transportation Needs: No Transportation Needs   Lack of Transportation (Medical): No   Lack of Transportation (Non-Medical): No  Physical Activity: Insufficiently Active   Days of Exercise per Week: 4 days   Minutes of Exercise per Session: 20 min  Stress: Stress Concern Present   Feeling of Stress : Rather much  Social Connections: Moderately Isolated   Frequency of Communication with Friends and Family: More than three times a week   Frequency of Social Gatherings with Friends and Family: Once a week   Attends Religious Services: Never   Marine scientist or Organizations: No   Attends Music therapist: Never    Marital Status: Married     Family History:  The patient's  family history includes CAD in his father and  mother; Heart attack in his brother; Hypertension in his brother, father, and mother.   ROS:   Please see the history of present illness.    ROS All other systems reviewed and are negative.   PHYSICAL EXAM:   VS:  BP 118/62    Pulse 60    Ht 6' (1.829 m)    Wt 208 lb 6.4 oz (94.5 kg)    SpO2 95%    BMI 28.26 kg/m   Physical Exam  GEN: Well nourished, well developed, in no acute distress  Neck: no JVD, carotid bruits, or masses Cardiac:RRR; no murmurs, rubs, or gallops  Respiratory:  clear to auscultation bilaterally, normal work of breathing GI: soft, nontender, nondistended, + BS Ext: Brawny changes without cyanosis, clubbing, or edema, Good distal pulses bilaterally Neuro:  Alert and Oriented x 3,  Psych: euthymic mood, full affect  Wt Readings from Last 3 Encounters:  05/07/21 208 lb 6.4 oz (94.5 kg)  04/03/21 206 lb (93.4 kg)  04/01/21 207 lb (93.9 kg)      Studies/Labs Reviewed:   EKG:  EKG is not ordered today.     Recent Labs: 04/01/2021: ALT 60; Hemoglobin 12.0; Platelets 151; TSH 2.360 04/16/2021: BUN 11; Creatinine, Ser 1.00; Potassium 4.5; Sodium 144   Lipid Panel    Component Value Date/Time   CHOL 195 04/01/2021 1254   TRIG 78 04/01/2021 1254   HDL 52 04/01/2021 1254   CHOLHDL 3.8 04/01/2021 1254   CHOLHDL 2.5 03/01/2020 1344   VLDL 16.6 12/29/2016 1618   LDLCALC 129 (H) 04/01/2021 1254   LDLCALC 67 03/01/2020 1344    Additional studies/ records that were reviewed today include:   Echo 04/16/21 IMPRESSIONS     1. Left ventricular ejection fraction, by estimation, is 40 to 45%. The  left ventricle has mildly decreased function. The left ventricle  demonstrates regional wall motion abnormalities witih basal to mid  inferior and inferolateral akinesis,  anterolateral hypokinesis. The left ventricular internal cavity size was  mildly dilated.  There is mild left ventricular hypertrophy. Left  ventricular diastolic parameters are consistent with Grade I diastolic  dysfunction (impaired relaxation).   2. Right ventricular systolic function is normal. The right ventricular  size is normal. There is normal pulmonary artery systolic pressure. The  estimated right ventricular systolic pressure is 73.2 mmHg.   3. Left atrial size was moderately dilated.   4. Right atrial size was mildly dilated.   5. The mitral valve is normal in structure. Trivial mitral valve  regurgitation. No evidence of mitral stenosis.   6. The aortic valve is tricuspid. Aortic valve regurgitation is trivial.  Aortic valve sclerosis/calcification is present, without any evidence of  aortic stenosis.   7. Aortic dilatation noted. There is moderate dilatation of the ascending  aorta, measuring 44 mm.   8. The inferior vena cava is normal in size with greater than 50%  respiratory variability, suggesting right atrial pressure of 3 mmHg.    Echo 2018 Study Conclusions   - Left ventricle: The cavity size was mildly dilated. There was    mild concentric hypertrophy. Systolic function was mildly to    moderately reduced. The estimated ejection fraction was in the    range of 40% to 45%. Hypokinesis of the basal-mid inferior,    inferoseptal, anteroseptal, and inferolateral myocardium Doppler    parameters are consistent with abnormal left ventricular    relaxation (grade 1 diastolic dysfunction). Doppler parameters    are consistent with indeterminate  ventricular filling pressure.  - Aortic valve: Transvalvular velocity was within the normal range.    There was no stenosis. There was no regurgitation.  - Ascending aorta: The ascending aorta was mildly dilated.  - Mitral valve: There was trivial regurgitation.  - Left atrium: The atrium was severely dilated.  - Right ventricle: The cavity size was normal. Wall thickness was    normal. Systolic function was  normal.  - Right atrium: The atrium was severely dilated.  - Atrial septum: No defect or patent foramen ovale was identified    by color flow Doppler.  - Tricuspid valve: There was no regurgitation.    Risk Assessment/Calculations:    CHA2DS2-VASc Score = 5   This indicates a 7.2% annual risk of stroke. The patient's score is based upon: CHF History: 1 HTN History: 1 Diabetes History: 0 Stroke History: 0 Vascular Disease History: 1 Age Score: 2 Gender Score: 0        ASSESSMENT:    1. Ischemic cardiomyopathy   2. Edema, unspecified type   3. Coronary artery disease involving coronary bypass graft of native heart without angina pectoris   4. Chronic systolic CHF (congestive heart failure) ([place])   5. VT (ventricular tachycardia)   6. Paroxysmal atrial fibrillation (HCC)   7. Essential hypertension   8. Abdominal aortic aneurysm (AAA) without rupture, unspecified part   9. Aortic root dilatation (HCC)   10. Hyperlipidemia, unspecified hyperlipidemia type      PLAN:  In order of problems listed above:  ICM EF 40-45% on echo 2018 treatment limited b/c of soft BP. Recent increase RLE edema. Repeat echo 04/16/21 EF 40-45% .   LE edema-improved with extra lasix about every other day. lasix 40/20 mg every other day, K 20 meq alternating with 40 meq every other day. F/u bmet stable   CAD prior CABG-no angina   Chronic systolic CHF-improved on extra. 2 gm sodium and  repeat 2 D echo unchanged   VT S/P ICD on Amio and Mexlitine, device check 01/2021 normal. Will check surveillance labs. He's followed by Dr. Henrene Pastor for elevated LFT's.   PAF on amiodarone, mexilitine and eliquis-no bleeding problems   HTN-well controlled  Dilated aorta 44 mm and history of infrarenal AAA on CT 2019. Will order chest and abdominal CT to f/u.  HLD-LDL 129 but increased LFT's seeing lipid clinic today and should f/u with GI  Shared Decision Making/Informed Consent        Medication  Adjustments/Labs and Tests Ordered: Current medicines are reviewed at length with the patient today.  Concerns regarding medicines are outlined above.  Medication changes, Labs and Tests ordered today are listed in the Patient Instructions below. Patient Instructions  Medication Instructions:  Your physician recommends that you continue on your current medications as directed. Please refer to the Current Medication list given to you today. *If you need a refill on your cardiac medications before your next appointment, please call your pharmacy*   Lab Work: NONE ORDERED If you have labs (blood work) drawn today and your tests are completely normal, you will receive your results only by: [place] (if you have MyChart) OR A paper copy in the mail If you have any lab test that is abnormal or we need to change your treatment, we will call you to review the results.   Testing/Procedures: Non-Cardiac CT scanning, (CAT scanning), is a noninvasive, special x-ray that produces cross-sectional images of the body using x-rays and a computer. CT scans help  physicians diagnose and treat medical conditions. For some CT exams, a contrast material is used to enhance visibility in the area of the body being studied. CT scans provide greater clarity and reveal more details than regular x-ray exams.  CHEST AND ABDOMEN  Follow-Up: At [facility], you and your health needs are our priority.  As part of our continuing mission to provide you with exceptional heart care, we have created designated Provider Care Teams.  These Care Teams include your primary Cardiologist (physician) and Advanced Practice Providers (APPs -  Physician Assistants and Nurse Practitioners) who all work together to provide you with the care you need, when you need it.  We recommend signing up for the patient portal called "MyChart".  Sign up information is provided on this After Visit Summary.  MyChart is used to connect with  patients for Virtual Visits (Telemedicine).  Patients are able to view lab/test results, encounter notes, upcoming appointments, etc.  Non-urgent messages can be sent to your provider as well.   To learn more about what you can do with MyChart, go to [email or web address].    Your next appointment:   4 month(s)  The format for your next appointment:   In Person  Provider:  Sinclair Grooms, MD    Other Instructions     Signed, Ermalinda Barrios, PA-C  05/07/2021 2:22 PM    [address] [place], [place], [place]  [address] Phone: [phone]; Fax: [phone]

## 2021-05-02 ENCOUNTER — Ambulatory Visit: Payer: Medicare Other | Admitting: Interventional Cardiology

## 2021-05-05 ENCOUNTER — Other Ambulatory Visit: Payer: Self-pay | Admitting: Orthopedic Surgery

## 2021-05-05 DIAGNOSIS — G629 Polyneuropathy, unspecified: Secondary | ICD-10-CM

## 2021-05-07 ENCOUNTER — Ambulatory Visit: Payer: Medicare Other | Admitting: Physician Assistant

## 2021-05-07 ENCOUNTER — Encounter: Payer: Self-pay | Admitting: Physician Assistant

## 2021-05-07 ENCOUNTER — Other Ambulatory Visit: Payer: Self-pay

## 2021-05-07 ENCOUNTER — Ambulatory Visit (INDEPENDENT_AMBULATORY_CARE_PROVIDER_SITE_OTHER): Payer: Medicare Other | Admitting: Pharmacist

## 2021-05-07 VITALS — BP 118/62 | HR 60 | Ht 72.0 in | Wt 208.4 lb

## 2021-05-07 DIAGNOSIS — I472 Ventricular tachycardia, unspecified: Secondary | ICD-10-CM

## 2021-05-07 DIAGNOSIS — I5022 Chronic systolic (congestive) heart failure: Secondary | ICD-10-CM | POA: Diagnosis not present

## 2021-05-07 DIAGNOSIS — I714 Abdominal aortic aneurysm, without rupture, unspecified: Secondary | ICD-10-CM

## 2021-05-07 DIAGNOSIS — I25810 Atherosclerosis of coronary artery bypass graft(s) without angina pectoris: Secondary | ICD-10-CM

## 2021-05-07 DIAGNOSIS — I10 Essential (primary) hypertension: Secondary | ICD-10-CM

## 2021-05-07 DIAGNOSIS — I255 Ischemic cardiomyopathy: Secondary | ICD-10-CM

## 2021-05-07 DIAGNOSIS — E7849 Other hyperlipidemia: Secondary | ICD-10-CM

## 2021-05-07 DIAGNOSIS — E785 Hyperlipidemia, unspecified: Secondary | ICD-10-CM

## 2021-05-07 DIAGNOSIS — I77810 Thoracic aortic ectasia: Secondary | ICD-10-CM

## 2021-05-07 DIAGNOSIS — R609 Edema, unspecified: Secondary | ICD-10-CM

## 2021-05-07 DIAGNOSIS — I480 Paroxysmal atrial fibrillation: Secondary | ICD-10-CM

## 2021-05-07 MED ORDER — ROSUVASTATIN CALCIUM 10 MG PO TABS: 10.0000 mg | ORAL_TABLET | Freq: Every day | ORAL | 11 refills | Status: DC

## 2021-05-07 NOTE — Patient Instructions (Addendum)
Medication Instructions:  Your physician recommends that you continue on your current medications as directed. Please refer to the Current Medication list given to you today. *If you need a refill on your cardiac medications before your next appointment, please call your pharmacy*   Lab Work: NONE ORDERED If you have labs (blood work) drawn today and your tests are completely normal, you will receive your results only by: [place] (if you have MyChart) OR A paper copy in the mail If you have any lab test that is abnormal or we need to change your treatment, we will call you to review the results.   Testing/Procedures: Non-Cardiac CT scanning, (CAT scanning), is a noninvasive, special x-ray that produces cross-sectional images of the body using x-rays and a computer. CT scans help physicians diagnose and treat medical conditions. For some CT exams, a contrast material is used to enhance visibility in the area of the body being studied. CT scans provide greater clarity and reveal more details than regular x-ray exams.  CHEST AND ABDOMEN  Follow-Up: At [facility], you and your health needs are our priority.  As part of our continuing mission to provide you with exceptional heart care, we have created designated Provider Care Teams.  These Care Teams include your primary Cardiologist (physician) and Advanced Practice Providers (APPs -  Physician Assistants and Nurse Practitioners) who all work together to provide you with the care you need, when you need it.  We recommend signing up for the patient portal called "MyChart".  Sign up information is provided on this After Visit Summary.  MyChart is used to connect with patients for Virtual Visits (Telemedicine).  Patients are able to view lab/test results, encounter notes, upcoming appointments, etc.  Non-urgent messages can be sent to your provider as well.   To learn more about what you can do with MyChart, go to [email or web address].     Your next appointment:   4 month(s)  The format for your next appointment:   In Person  Provider:  Sinclair Grooms, MD    Other Instructions

## 2021-05-07 NOTE — Progress Notes (Signed)
Patient ID: Steven Guzman                 DOB: May 06, 1940                    MRN: [id number]     HPI: Steven Guzman is a 81 y.o. male patient referred to lipid clinic by Ermalinda Barrios, PA. PMH is significant for ischemic cardiomyopathy with EF 40-45% on 2018 echo, CAD s/p CABG, MI in 1994 and 2011, PAF, HTN, ventricular tachycardia with ICD, CKD, COPD, and tobacco abuse.  Pt presents today for follow up. He previously took atorvastatin for about a decade. This was held 01/02/21 GI appt due to mildly elevated LFTs (ALT 51, AST 46). Repeat LFTs 04/01/21 were relatively unchanged with ALT still mildly elevated at 60, ALT 62. He is still on amiodarone. His ANA was positive at his October GI appt. Plan was for 3 month follow up which pt has not had, no GI appts are scheduled either. Pt also reports taking up to 2 Tylenol daily over the past month due to neck pain/headache.  Current Medications: fenofibrate 135mg  daily Intolerances: atorvastatin 80mg  daily - held due to elevated LFTs (ALT mildly elevated at 51) Risk Factors: progressive ASCVD, CKD, tobacco use LDL goal: 55mg /dL  Diet: Watches sugar and sodium, his wife is diabetic  Exercise: Walks his dog  Family History: CAD in his father and mother; Heart attack in his brother; Hypertension in his brother, father, and mother.   Social History: Former tobacco use for 55 years. Occasional alcohol use.  Labs: 04/01/21: TC 195, TG 78, HDL 52, LDL 129, AST 62, ALT 60 (on fenofibrate 135mg  daily) 03/01/20: TC 136, HDL 54, TG 71, LDL 67 (atorvastatin 80mg  daily, fenofibrate 135mg  daily)  Past Medical History:  Diagnosis Date   AICD (automatic cardioverter/defibrillator) present    Aneurysm ([place])    a. Aneurysmal infrarenal aorta up to 33 mm on CT 10/2014, recommended f/u due 10/2017   Anginal pain ([place])    Anxiety    Basal cell carcinoma of nose    S/P MOHS   Biliary acute pancreatitis    CAD (coronary artery disease)    a. s/p MI in 1994  with PCI to LAD at that time b. cath 10/2012 demonstrated EF 30%, inferior akinesis with mild hypokinesis of all walls, patent LAD and RCA stents; ostial PDA with 80-90% obstruction with medical therapy recommended    Chronic systolic CHF (congestive heart failure) (HCC)    EF 30 to 35 % as of 09/2014.    CKD (chronic kidney disease), stage III ([place])    Complication of anesthesia 10/2014   "had to have defibrillator w/ERCP"   COPD (chronic obstructive pulmonary disease) ([place])    a. followed by pulmonary, COPD GOLD stage II   Depression    Diverticulosis of colon 07/2014   noted on CT   GERD (gastroesophageal reflux disease)    Hiatal hernia    Hyperglycemia 10/2012.   Hyperlipidemia    Hypertension    Myocardial infarction [facility]) 1994; 2011   Pneumonia 1946; 2015   Prostate enlargement 07/2014   observed on CT   Tobacco abuse    Ventricular tachycardia    a. 08/2009 s/p BSX E110 Teligen 100 AICD, ser#: [id number];  b. 08/2008 VT req ATP - detection reprogrammed from 160 to 150. c. EPS and VT ablation by Dr. Lovena Le 12/21/2014    Current Outpatient Medications on File Prior to Visit  Medication Sig Dispense Refill   albuterol (PROVENTIL) (2.5 MG/3ML) 0.083% nebulizer solution USE 1 VIAL VIA NEBULIZER EVERY 6 HOURS AS NEEDED FOR WHEEZING OR SHORTNESS OF BREATH 360 mL 0   amiodarone (PACERONE) 200 MG tablet Take 1 tablet (200 mg total) by mouth daily. 30 tablet 6   AMOXICILLIN PO Take 1 tablet by mouth every 12 (twelve) hours. (Patient not taking: Reported on 04/03/2021)     amoxicillin-clavulanate (AUGMENTIN) 875-125 MG tablet Take 1 tablet by mouth every 12 (twelve) hours. 14 tablet 0   apixaban (ELIQUIS) 5 MG TABS tablet Take 1 tablet (5 mg total) by mouth 2 (two) times daily. 180 tablet 1   Artificial Tear Solution (SOOTHE XP OP) Place 2 drops into both eyes daily as needed (dry eyes).     benazepril (LOTENSIN) 10 MG tablet Take 1 tablet (10 mg total) by mouth daily. 30 tablet 6    budesonide-formoterol (SYMBICORT) 160-4.5 MCG/ACT inhaler Inhale 2 puffs into the lungs 2 (two) times daily. (Patient not taking: Reported on 04/03/2021) 10.2 each 2   buPROPion (WELLBUTRIN) 75 MG tablet Take 1 tablet (75 mg total) by mouth daily. 90 tablet 2   busPIRone (BUSPAR) 15 MG tablet TAKE 1 TABLET BY MOUTH TWICE A DAY 180 tablet 2   carvedilol (COREG) 3.125 MG tablet Take 1 tablet (3.125 mg total) by mouth 2 (two) times daily. 60 tablet 6   cetirizine (ZYRTEC) 10 MG tablet Take 1 tablet by mouth daily as needed.     Choline Fenofibrate (FENOFIBRIC ACID) 135 MG CPDR TAKE ONE CAPSULE BY MOUTH DAILY 90 capsule 1   Cyanocobalamin (VITAMIN B-12) 5000 MCG SUBL Place under the tongue daily.     escitalopram (LEXAPRO) 20 MG tablet Take 1 tablet (20 mg total) by mouth daily. 90 tablet 1   finasteride (PROSCAR) 5 MG tablet Take 5 mg by mouth daily.     fluticasone (FLONASE) 50 MCG/ACT nasal spray Place 2 sprays into both nostrils daily. 16 g 6   Fluticasone-Umeclidin-Vilant (TRELEGY ELLIPTA) 200-62.5-25 MCG/ACT AEPB Inhale 1 puff into the lungs daily.     furosemide (LASIX) 40 MG tablet TAKE 40 MG EVERY OTHER DAY AND 20 MG ON THE ALTERNATIVE DAYS 135 tablet 3   gabapentin (NEURONTIN) 300 MG capsule TAKE ONE CAPSULE BY MOUTH THREE TIMES A DAY 90 capsule 2   Ipratropium-Albuterol (COMBIVENT RESPIMAT) 20-100 MCG/ACT AERS respimat Inhale 1 puff into the lungs every 6 (six) hours. Shortness of breath or wheezing 4 g 5   IRON PO Take by mouth daily.      MAGNESIUM-OXIDE 400 (241.3 Mg) MG tablet TAKE 1 TABSULE BY MOUTH DAILY 90 tablet 3   mexiletine (MEXITIL) 200 MG capsule Take 1 capsule (200 mg total) by mouth 2 (two) times daily. 180 capsule 1   Multiple Vitamins-Minerals (CENTRUM ADULTS PO) Take by mouth daily.     nitroGLYCERIN (NITROSTAT) 0.4 MG SL tablet DISSOLVE 1 TABLET UNDER THE TONGUE EVERY 5 MINUTES FOR 3 DOSES AS NEEDED FOR CHEST PAIN 25 tablet 6   omeprazole (PRILOSEC) 20 MG capsule Take 20 mg  by mouth daily.      potassium chloride SA (KLOR-CON M20) 20 MEQ tablet TAKE 40 MG EVERY OTHER DAY AND 20 MG ON THE ALTERNATIVE DAYS. 135 tablet 3   Respiratory Therapy Supplies (FLUTTER) DEVI Use as directed 1 each 0   Sennosides-Docusate Sodium (STOOL SOFTENER/LAXATIVE PO) Take 2 tablets by mouth daily.     Spacer/Aero-Holding Chambers (OPTICHAMBER DIAMOND) MISC optichamber VHC 1  each 0   tamsulosin (FLOMAX) 0.4 MG CAPS capsule Take one capsule by mouth once daily after supper. 90 capsule 1   Tiotropium Bromide Monohydrate (SPIRIVA RESPIMAT) 2.5 MCG/ACT AERS Inhale 2 puffs into the lungs daily. (Patient not taking: Reported on 04/03/2021) 4 g 2   No current facility-administered medications on file prior to visit.    Allergies  Allergen Reactions   Sulfa Antibiotics Hives    Assessment/Plan:  1. Hyperlipidemia - Baseline LDL up to 129 above goal < 55 given progressive ASCVD. Atorvastatin 80mg  daily was held 3 months ago due to mildly elevated LFTs. Follow up LFTs 3 months later were unchanged, not likely statin-induced. Pt does still take amiodarone which may be contributing. His ANA was also positive at his GI appt and he has not had his 3 month follow up visit which he was encouraged to schedule. Will rechallenge with lower dose of rosuvastatin 10mg  daily and recheck lipids/LFTs in 1 month. If labs are stable, will titrate rosuvastatin up to high intensity dose given his ASCVD history. Will also stop his fenofibrate since his TG have been in the 70s for the past few years.  Amadi Yoshino E. Mayfield Schoene, PharmD, BCACP, [place] [address], [place], [place] [address] Phone: [phone]; Fax: [phone] 05/07/2021 3:14 PM

## 2021-05-07 NOTE — Patient Instructions (Addendum)
It was nice to see you today  Your LDL cholesterol is 129 and your goal is < 55  Start rosuvastatin 10mg  1 tablet daily  Stop fenofibrate 135mg  daily  Stay off your atorvastatin  Recheck cholesterol and liver enzymes in 1 month. Come in any time after 7:30am on March 8th for fasting labs  Schedule follow up with GI

## 2021-05-08 ENCOUNTER — Ambulatory Visit (INDEPENDENT_AMBULATORY_CARE_PROVIDER_SITE_OTHER): Payer: Medicare Other | Admitting: Nurse Practitioner

## 2021-05-08 ENCOUNTER — Telehealth: Payer: Self-pay

## 2021-05-08 ENCOUNTER — Encounter: Payer: Self-pay | Admitting: Interventional Cardiology

## 2021-05-08 DIAGNOSIS — M159 Polyosteoarthritis, unspecified: Secondary | ICD-10-CM

## 2021-05-08 DIAGNOSIS — M4802 Spinal stenosis, cervical region: Secondary | ICD-10-CM

## 2021-05-08 LAB — BASIC METABOLIC PANEL
BUN/Creatinine Ratio: 11 (ref 10–24)
BUN: 13 mg/dL (ref 8–27)
CO2: 23 mmol/L (ref 20–29)
Calcium: 9.5 mg/dL (ref 8.6–10.2)
Chloride: 102 mmol/L (ref 96–106)
Creatinine, Ser: 1.14 mg/dL (ref 0.76–1.27)
Glucose: 91 mg/dL (ref 70–99)
Potassium: 4.7 mmol/L (ref 3.5–5.2)
Sodium: 140 mmol/L (ref 134–144)
eGFR: 64 mL/min/{1.73_m2} (ref >59)

## 2021-05-08 MED ORDER — NITROGLYCERIN 0.4 MG SL SUBL: 0.4000 mg | SUBLINGUAL_TABLET | SUBLINGUAL | 11 refills | Status: DC | PRN

## 2021-05-08 NOTE — Progress Notes (Signed)
This service is provided via telemedicine  No vital signs collected/recorded due to the encounter was a telemedicine visit.   Location of patient (ex: home, work):  Home  Patient consents to a telephone visit:  Yes, see encounter dated 05/08/2021  Location of the provider (ex: office, home):  [place]  Name of any referring provider:  [name], NP  Names of all persons participating in the telemedicine service and their role in the encounter:  Sherrie Mustache, Nurse Practitioner, Carroll Kinds, CMA, and patient.   Time spent on call:  8 minutes with medical assistant

## 2021-05-08 NOTE — Telephone Encounter (Signed)
Mr. jovonni, borquez are scheduled for a virtual visit with your provider today.    Just as we do with appointments in the office, we must obtain your consent to participate.  Your consent will be active for this visit and any virtual visit you may have with one of our providers in the next 365 days.    If you have a MyChart account, I can also send a copy of this consent to you electronically.  All virtual visits are billed to your insurance company just like a traditional visit in the office.  As this is a virtual visit, video technology does not allow for your provider to perform a traditional examination.  This may limit your provider's ability to fully assess your condition.  If your provider identifies any concerns that need to be evaluated in person or the need to arrange testing such as labs, EKG, etc, we will make arrangements to do so.    Although advances in technology are sophisticated, we cannot ensure that it will always work on either your end or our end.  If the connection with a video visit is poor, we may have to switch to a telephone visit.  With either a video or telephone visit, we are not always able to ensure that we have a secure connection.   I need to obtain your verbal consent now.   Are you willing to proceed with your visit today?   MACKSON BOTZ has provided verbal consent on 05/08/2021 for a virtual visit (video or telephone).   Carroll Kinds, CMA 05/08/2021  1:59 PM

## 2021-05-08 NOTE — Progress Notes (Signed)
Careteam: Patient Care Team: Yvonna Alanis, NP as PCP - General (Adult Health Nurse Practitioner) Belva Crome, MD as PCP - Cardiology (Cardiology) [name], MD as PCP - Electrophysiology (Cardiology) Roel Cluck, MD as Referring Physician (Ophthalmology) Harriett Sine, MD as Consulting Physician (Dermatology)  Advanced Directive information    Allergies  Allergen Reactions   Sulfa Antibiotics Hives    Chief Complaint  Patient presents with   Acute Visit    Patient complaining of neck and knee pain. Neck pain has been ongoing. Neck pain causes headache. Having some vertigo and problems with balance. Patient was given shot in neck a while back that didn't help. Knee pain started about 2 months ago. Knee pain outside of right leg (femur area). Knee pain worse when bending knee. Pain in knee not constant. Patient taking Tylenol and salon pas for knee pain.     HPI: Patient is a 81 y.o. male for telephone visit, unable to do face to face.   Neck pain- has been chronic, goes to back of head and causes headache.  Also concern that it is causing balance issues (reports he is also on medication that can effect this)  Reports a soreness in his neck and unable to move his neck but a few inches.  He has seen specialist for this in the past but unsure of who he saw. He did receive a shot but it did not help. He did not follow up.  He is most concerned with the pain causing his headache.  He has gabapentin prescribed but does not take, he needed a refill and just picked it up but has not taken.   Also has complaints of knee pain. Has worsened in the last 6 months.  On the outside of right leg. Pain worse on the bony area.  Very painful when he walks long distances. Not as bad when he is walking around the house.   Has used a knee brace, sometimes helps sometimes does not.  Denies injury, suspects arthritis.  No swelling, redness or heat.   Reports his leg is  swollen but improving.   Review of Systems:  Review of Systems  Constitutional:  Negative for chills, fever and weight loss.  Musculoskeletal:  Positive for joint pain, myalgias and neck pain. Negative for back pain.   Past Medical History:  Diagnosis Date   AICD (automatic cardioverter/defibrillator) present    Aneurysm ([place])    a. Aneurysmal infrarenal aorta up to 33 mm on CT 10/2014, recommended f/u due 10/2017   Anginal pain ([place])    Anxiety    Basal cell carcinoma of nose    S/P MOHS   Biliary acute pancreatitis    CAD (coronary artery disease)    a. s/p MI in 1994 with PCI to LAD at that time b. cath 10/2012 demonstrated EF 30%, inferior akinesis with mild hypokinesis of all walls, patent LAD and RCA stents; ostial PDA with 80-90% obstruction with medical therapy recommended    Chronic systolic CHF (congestive heart failure) (HCC)    EF 30 to 35 % as of 09/2014.    CKD (chronic kidney disease), stage III ([place])    Complication of anesthesia 10/2014   "had to have defibrillator w/ERCP"   COPD (chronic obstructive pulmonary disease) ([place])    a. followed by pulmonary, COPD GOLD stage II   Depression    Diverticulosis of colon 07/2014   noted on CT   GERD (gastroesophageal reflux disease)  Hiatal hernia    Hyperglycemia 10/2012.   Hyperlipidemia    Hypertension    Myocardial infarction [facility]) 1994; 2011   Pneumonia 1946; 2015   Prostate enlargement 07/2014   observed on CT   Tobacco abuse    Ventricular tachycardia    a. 08/2009 s/p BSX E110 Teligen 100 AICD, ser#: [id number];  b. 08/2008 VT req ATP - detection reprogrammed from 160 to 150. c. EPS and VT ablation by Dr. Lovena Le 12/21/2014   Past Surgical History:  Procedure Laterality Date   BIOPSY  12/21/2017   Procedure: BIOPSY;  Surgeon: [name], MD;  Location: [facility];  Service: Endoscopy;;   CATARACT EXTRACTION W/ INTRAOCULAR LENS  IMPLANT, BILATERAL Bilateral ~ 2011   COLONOSCOPY     COLONOSCOPY WITH PROPOFOL N/A  12/21/2017   Procedure: COLONOSCOPY WITH PROPOFOL;  Surgeon: [name], MD;  Location: [facility];  Service: Endoscopy;  Laterality: N/A;   ELECTROPHYSIOLOGIC STUDY N/A 12/21/2014   Procedure: V Tach Ablation;  Surgeon: [name], MD;  Location: [facility];  Service: Cardiovascular;  Laterality: N/A;   ERCP N/A 11/16/2014   Procedure: ENDOSCOPIC RETROGRADE CHOLANGIOPANCREATOGRAPHY (ERCP);  Surgeon: [name], MD;  Location: [facility];  Service: Endoscopy;  Laterality: N/A;   ESOPHAGOGASTRODUODENOSCOPY (EGD) WITH PROPOFOL N/A 12/21/2017   Procedure: ESOPHAGOGASTRODUODENOSCOPY (EGD) WITH PROPOFOL;  Surgeon: [name], MD;  [facility]: [facility];  Service: Endoscopy;  Laterality: N/A;   EYE SURGERY     FOOT SURGERY Left 2005   "fixed bone that stuck out in my ankle area"   HEMORRHOID BANDING     IMPLANTABLE CARDIOVERTER DEFIBRILLATOR IMPLANT  09/06/09   BSX dual chamber ICD implanted in [place] for cardiac arrest 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   [place] Right ~ [place] N/A 11/25/2012   demonstrated EF 30%, inferior akinesis with mild hypokinesis of all walls, patent LAD and RCA stents; ostial PDA with 80-90% obstruction with medical therapy recommended   MOHS SURGERY  2008   nose, skin graft   POLYPECTOMY  12/21/2017   Procedure: POLYPECTOMY;  Surgeon: [name], MD;  [facility]: [facility];  Service: Endoscopy;;   RETINAL DETACHMENT SURGERY Right 2013   TENOLYSIS Right 12/21/2013   Procedure: TENOLYSIS FLEXOR CARPI RADIALIS ,DEBRIDEMENT RIGHT JOINT WRIST,DEBRIDEMENT SCAPHOTRAPEZIAL TRAPEZOID, REPAIR OF EXTENSOR HOOD;  Surgeon: Daryll Brod, MD;  Location: [place];  Service: Orthopedics;  Laterality: Right;   TOE SURGERY Right 09/2019   3rd toe/hammer toe   V-TACH ABLATION  12/21/2014   VIDEO BRONCHOSCOPY Bilateral 01/09/2016   Procedure: VIDEO BRONCHOSCOPY WITHOUT FLUORO;  Surgeon: Juanito Doom, MD;  [facility]: [facility];  Service: Cardiopulmonary;  Laterality: Bilateral;   Social History:   reports that he has quit smoking. His smoking use included cigarettes. He has a 55.00 pack-year smoking history. He has never used smokeless tobacco. He reports current alcohol use. He reports that he does not use drugs.  Family History  Problem Relation Age of Onset   Heart attack Brother    CAD Father    Hypertension Father    CAD Mother    Hypertension Mother    Hypertension Brother    Stroke Neg Hx     Medications: Patient's Medications  New Prescriptions   No medications on file  Previous Medications   ALBUTEROL (PROVENTIL) (2.5 MG/3ML) 0.083% NEBULIZER SOLUTION    USE 1 VIAL VIA NEBULIZER EVERY 6  HOURS AS NEEDED FOR WHEEZING OR SHORTNESS OF BREATH   AMIODARONE (PACERONE) 200 MG TABLET    Take 1 tablet (200 mg total) by mouth daily.   APIXABAN (ELIQUIS) 5 MG TABS TABLET    Take 1 tablet (5 mg total) by mouth 2 (two) times daily.   ARTIFICIAL TEAR SOLUTION (SOOTHE XP OP)    Place 2 drops into both eyes daily as needed (dry eyes).   BENAZEPRIL (LOTENSIN) 10 MG TABLET    Take 1 tablet (10 mg total) by mouth daily.   BUDESONIDE-FORMOTEROL (SYMBICORT) 160-4.5 MCG/ACT INHALER    Inhale 2 puffs into the lungs 2 (two) times daily.   BUPROPION (WELLBUTRIN) 75 MG TABLET    Take 1 tablet (75 mg total) by mouth daily.   BUSPIRONE (BUSPAR) 15 MG TABLET    TAKE 1 TABLET BY MOUTH TWICE A DAY   CARVEDILOL (COREG) 3.125 MG TABLET    Take 1 tablet (3.125 mg total) by mouth 2 (two) times daily.   CETIRIZINE (ZYRTEC) 10 MG TABLET    Take 1 tablet by mouth daily as needed.   CYANOCOBALAMIN (VITAMIN B-12) 5000 MCG SUBL    Place under the tongue daily.   ESCITALOPRAM (LEXAPRO) 20 MG TABLET    Take 1 tablet (20 mg total) by mouth daily.   FINASTERIDE (PROSCAR) 5 MG TABLET    Take 5 mg by mouth daily.   FLUTICASONE (FLONASE) 50 MCG/ACT NASAL SPRAY    Place 2 sprays into both nostrils daily.    FLUTICASONE-UMECLIDIN-VILANT (TRELEGY ELLIPTA) 200-62.5-25 MCG/ACT AEPB    Inhale 1 puff into the lungs daily.   FUROSEMIDE (LASIX) 40 MG TABLET    TAKE 40 MG EVERY OTHER DAY AND 20 MG ON THE ALTERNATIVE DAYS   GABAPENTIN (NEURONTIN) 300 MG CAPSULE    TAKE ONE CAPSULE BY MOUTH THREE TIMES A DAY   IPRATROPIUM-ALBUTEROL (COMBIVENT RESPIMAT) 20-100 MCG/ACT AERS RESPIMAT    Inhale 1 puff into the lungs every 6 (six) hours. Shortness of breath or wheezing   IRON PO    Take by mouth daily.    MAGNESIUM-OXIDE 400 (241.3 MG) MG TABLET    TAKE 1 TABSULE BY MOUTH DAILY   MEXILETINE (MEXITIL) 200 MG CAPSULE    Take 1 capsule (200 mg total) by mouth 2 (two) times daily.   MULTIPLE VITAMINS-MINERALS (CENTRUM ADULTS PO)    Take by mouth daily.   NITROGLYCERIN (NITROSTAT) 0.4 MG SL TABLET    Place 1 tablet (0.4 mg total) under the tongue every 5 (five) minutes as needed for chest pain. DISSOLVE 1 TABLET UNDER THE TONGUE EVERY 5 MINUTES FOR 3 DOSES   OMEPRAZOLE (PRILOSEC) 20 MG CAPSULE    Take 20 mg by mouth daily.    POTASSIUM CHLORIDE SA (KLOR-CON M20) 20 MEQ TABLET    TAKE 40 MG EVERY OTHER DAY AND 20 MG ON THE ALTERNATIVE DAYS.   RESPIRATORY THERAPY SUPPLIES (FLUTTER) DEVI    Use as directed   ROSUVASTATIN (CRESTOR) 10 MG TABLET    Take 1 tablet (10 mg total) by mouth daily.   SENNOSIDES-DOCUSATE SODIUM (STOOL SOFTENER/LAXATIVE PO)    Take 2 tablets by mouth daily.   SPACER/AERO-HOLDING CHAMBERS (OPTICHAMBER DIAMOND) MISC    optichamber VHC   TAMSULOSIN (FLOMAX) 0.4 MG CAPS CAPSULE    Take one capsule by mouth once daily after supper.   TIOTROPIUM BROMIDE MONOHYDRATE (SPIRIVA RESPIMAT) 2.5 MCG/ACT AERS    Inhale 2 puffs into the lungs daily.  Modified Medications  No medications on file  Discontinued Medications   No medications on file    Physical Exam:  There were no vitals filed for this visit. There is no height or weight on file to calculate BMI. Wt Readings from Last 3 Encounters:  05/07/21  208 lb 6.4 oz (94.5 kg)  04/03/21 206 lb (93.4 kg)  04/01/21 207 lb (93.9 kg)      Labs reviewed: Basic Metabolic Panel: Recent Labs    06/07/20 1434 07/07/20 0554 04/01/21 1254 04/16/21 1407 05/07/21 1449  NA 142   < > 139 144 140  K 4.4   < > 4.6 4.5 4.7  CL 102   < > 102 105 102  CO2 23   < > 26 25 23   GLUCOSE 90   < > 95 91 91  BUN 11   < > 12 11 13   CREATININE 1.08   < > 1.01 1.00 1.14  CALCIUM 9.5   < > 9.5 9.7 9.5  TSH 2.290  --  2.360  --   --    < > = values in this interval not displayed.   Liver Function Tests: Recent Labs    06/07/20 1434 11/14/20 1502 04/01/21 1254  AST 61* 46* 62*  ALT 67* 51* 60*  ALKPHOS 54  --  52  BILITOT 0.8 0.8 0.6  PROT 6.6 6.4 6.5  ALBUMIN 4.3  --  4.2   No results for input(s): LIPASE, AMYLASE in the last 8760 hours. No results for input(s): AMMONIA in the last 8760 hours. CBC: Recent Labs    07/07/20 0554 11/14/20 1502 12/20/20 1410 04/01/21 1254  WBC 4.2 4.5 3.3* 4.1  NEUTROABS 2.4 2,907 2,056  --   HGB 11.9* 12.9* 12.0* 12.0*  HCT 36.2* 38.1* 36.9* 35.4*  MCV 102.5* 100.5* 102.2* 100*  PLT 167 119* 158 151   Lipid Panel: Recent Labs    04/01/21 1254  CHOL 195  HDL 52  LDLCALC 129*  TRIG 78  CHOLHDL 3.8   TSH: Recent Labs    06/07/20 1434 04/01/21 1254  TSH 2.290 2.360   A1C: Lab Results  Component Value Date   HGBA1C 5.6 12/29/2016     Assessment/Plan 1. Primary osteoarthritis involving multiple joints Ongoing pain to multiple joints, spine, knee.  Recommended strengthening exercises. Referral to PT would be beneficial. He will let us know.  Can use ice to knee TID -Voltaren gel QID as prescribed PRN -also can use tylenol 1000 mg every 8 hours as needed pain.  -follow up if symptoms fail to improve or worsen.   2. Degenerative cervical spinal stenosis -ongoing, to restart gabapentin TID PRN -continue to use heat PRN.  -encouraged follow up with neurosurgery     Next appt:  09/18/2021, sooner if needed Janett Billow K. Harle Battiest  [facility] [phone]    Virtual Visit via telephone  I connected with patient on 05/08/21 at  2:15 PM EST by telephone and verified that I am speaking with the correct person using two identifiers.  Location: Patient: home Provider: [place]   I discussed the limitations, risks, security and privacy concerns of performing an evaluation and management service by telephone and the availability of in person appointments. I also discussed with the patient that there may be a patient responsible charge related to this service. The patient expressed understanding and agreed to proceed.   I discussed the assessment and treatment plan with the patient. The patient was provided  an opportunity to ask questions and all were answered. The patient agreed with the plan and demonstrated an understanding of the instructions.   The patient was advised to call back or seek an in-person evaluation if the symptoms worsen or if the condition fails to improve as anticipated.  I provided 21 minutes of non-face-to-face time during this encounter.  [name]. Harle Battiest Avs printed and mailed

## 2021-05-15 ENCOUNTER — Other Ambulatory Visit: Payer: Self-pay

## 2021-05-15 ENCOUNTER — Emergency Department
Admission: EM | Admit: 2021-05-15 | Discharge: 2021-05-15 | Disposition: A | Discharge status: Home / Self Care | Payer: Medicare Other | Source: Home / Self Care

## 2021-05-15 DIAGNOSIS — J441 Chronic obstructive pulmonary disease with (acute) exacerbation: Secondary | ICD-10-CM | POA: Diagnosis not present

## 2021-05-15 DIAGNOSIS — J0100 Acute maxillary sinusitis, unspecified: Secondary | ICD-10-CM | POA: Diagnosis not present

## 2021-05-15 DIAGNOSIS — R059 Cough, unspecified: Secondary | ICD-10-CM

## 2021-05-15 LAB — POC SARS CORONAVIRUS 2 AG -  ED: SARS Coronavirus 2 Ag: NEGATIVE

## 2021-05-15 MED ORDER — DOXYCYCLINE HYCLATE 100 MG PO CAPS: 100.0000 mg | ORAL_CAPSULE | Freq: Two times a day (BID) | ORAL | 0 refills | Status: AC

## 2021-05-15 MED ORDER — PREDNISONE 20 MG PO TABS: ORAL_TABLET | ORAL | 0 refills | Status: DC

## 2021-05-15 NOTE — ED Triage Notes (Signed)
Pt here today with c/o headache, cough, shortness of breath and sore throat since Monday. Worsening since yesterday. COVID tested Tuesday which  was negative. Hx of COPD exacerbation. Uses nebulizer prn, last time was this am.

## 2021-05-15 NOTE — Discharge Instructions (Addendum)
Advised patient to take medication as directed with food to completion.  Advised patient to take prednisone with first dose of Doxycycline for the next 5 of 10 days.  Encouraged patient to increase daily water intake while taking these medications.

## 2021-05-15 NOTE — ED Provider Notes (Signed)
[name]    CSN: [id number] Arrival date & time: 05/15/21  1451      History   Chief Complaint Chief Complaint  Patient presents with   Shortness of Breath   Headache   Cough   Sore Throat    HPI Steven Guzman is a 81 y.o. male.   HPI 81 year old male presents with headache, cough, shortness of breath and sore throat since Monday, 05/12/2021.  Patient reports symptoms were worsening yesterday.  Reports home COVID-19 test was negative on Tuesday of this week.  Patient reports history of COPD exacerbations.  Patient reports he uses nebulizer at home as needed and did so this morning.  PMH significant for atrial fibrillation, chronic systolic heart failure s/p ICD in place, and COPD.  Past Medical History:  Diagnosis Date   AICD (automatic cardioverter/defibrillator) present    Aneurysm ([place])    a. Aneurysmal infrarenal aorta up to 33 mm on CT 10/2014, recommended f/u due 10/2017   Anginal pain ([place])    Anxiety    Basal cell carcinoma of nose    S/P MOHS   Biliary acute pancreatitis    CAD (coronary artery disease)    a. s/p MI in 1994 with PCI to LAD at that time b. cath 10/2012 demonstrated EF 30%, inferior akinesis with mild hypokinesis of all walls, patent LAD and RCA stents; ostial PDA with 80-90% obstruction with medical therapy recommended    Chronic systolic CHF (congestive heart failure) (HCC)    EF 30 to 35 % as of 09/2014.    CKD (chronic kidney disease), stage III ([place])    Complication of anesthesia 10/2014   "had to have defibrillator w/ERCP"   COPD (chronic obstructive pulmonary disease) ([place])    a. followed by pulmonary, COPD GOLD stage II   Depression    Diverticulosis of colon 07/2014   noted on CT   GERD (gastroesophageal reflux disease)    Hiatal hernia    Hyperglycemia 10/2012.   Hyperlipidemia    Hypertension    Myocardial infarction [facility]) 1994; 2011   Pneumonia 1946; 2015   Prostate enlargement 07/2014   observed on CT   Tobacco abuse     Ventricular tachycardia    a. 08/2009 s/p BSX E110 Teligen 100 AICD, ser#: [id number];  b. 08/2008 VT req ATP - detection reprogrammed from 160 to 150. c. EPS and VT ablation by Dr. Lovena Le 12/21/2014    Patient Active Problem List   Diagnosis Date Noted   Major depressive disorder with single episode, in partial remission ([place]) 03/05/2020   Paraseptal emphysema ([place]) 12/15/2018   Tobacco abuse 12/15/2018   Perianal abscess 11/15/2018   Left inguinal hernia 11/15/2018   Leg wound, right 11/15/2018   Weight loss 09/29/2018   Dark stools 09/29/2018   Lumbar trigger point syndrome 04/20/2018   Throat and mouth symptom 02/03/2018   Gastroesophageal reflux disease without esophagitis    Esophageal stricture    Gastritis and gastroduodenitis    Degenerative cervical spinal stenosis 10/27/2017   Carotid bruit 10/27/2017   B12 deficiency 08/25/2017   Anemia 06/10/2017   Right ankle pain 04/09/2017   BPH (benign prostatic hyperplasia) 12/31/2016   Dizzinesses 05/22/2016   Mass of throat 06/30/2015   Depression 05/24/2015   Atrial fibrillation ([place]) 03/15/2015   Routine general medical examination at a health care facility 05/12/2014   Hemoptysis 03/16/2014   COPD GOLD GRADE C 95/63/8756   Chronic systolic heart failure ([place]) 07/07/2013   CAD (  coronary artery disease) 11/23/2012   Smokers' cough ([place]) 11/23/2012   Ventricular tachycardia 11/23/2012   Essential hypertension 11/23/2012   Hyperlipidemia 11/23/2012   ICD (implantable cardioverter-defibrillator) in place 11/23/2012    Past Surgical History:  Procedure Laterality Date   BIOPSY  12/21/2017   Procedure: BIOPSY;  Surgeon: [name], MD;  [facility];  Service: Endoscopy;;   CATARACT EXTRACTION W/ INTRAOCULAR LENS  IMPLANT, BILATERAL Bilateral ~ 2011   COLONOSCOPY     COLONOSCOPY WITH PROPOFOL N/A 12/21/2017   Procedure: COLONOSCOPY WITH PROPOFOL;  Surgeon: [name], MD;  [facility];  Service:  Endoscopy;  Laterality: N/A;   ELECTROPHYSIOLOGIC STUDY N/A 12/21/2014   Procedure: V Tach Ablation;  Surgeon: [name], MD;  Location: [place] [facility];  Service: Cardiovascular;  Laterality: N/A;   ERCP N/A 11/16/2014   Procedure: ENDOSCOPIC RETROGRADE CHOLANGIOPANCREATOGRAPHY (ERCP);  Surgeon: [name], MD;  Location: [place];  Service: Endoscopy;  Laterality: N/A;   ESOPHAGOGASTRODUODENOSCOPY (EGD) WITH PROPOFOL N/A 12/21/2017   Procedure: ESOPHAGOGASTRODUODENOSCOPY (EGD) WITH PROPOFOL;  Surgeon: [name], MD;  [facility];  Service: Endoscopy;  Laterality: N/A;   EYE SURGERY     FOOT SURGERY Left 2005   "fixed bone that stuck out in my ankle area"   HEMORRHOID BANDING     IMPLANTABLE CARDIOVERTER DEFIBRILLATOR IMPLANT  09/06/09   BSX dual chamber ICD implanted in [place] for cardiac arrest 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   [place] Right ~ [place] N/A 11/25/2012   demonstrated EF 30%, inferior akinesis with mild hypokinesis of all walls, patent LAD and RCA stents; ostial PDA with 80-90% obstruction with medical therapy recommended   MOHS SURGERY  2008   nose, skin graft   POLYPECTOMY  12/21/2017   Procedure: POLYPECTOMY;  Surgeon: [name], MD;  [facility];  Service: Endoscopy;;   RETINAL DETACHMENT SURGERY Right 2013   TENOLYSIS Right 12/21/2013   Procedure: TENOLYSIS FLEXOR CARPI RADIALIS ,DEBRIDEMENT RIGHT JOINT WRIST,DEBRIDEMENT SCAPHOTRAPEZIAL TRAPEZOID, REPAIR OF EXTENSOR HOOD;  Surgeon: Daryll Brod, MD;  Location: [place];  Service: Orthopedics;  Laterality: Right;   TOE SURGERY Right 09/2019   3rd toe/hammer toe   V-TACH ABLATION  12/21/2014   VIDEO BRONCHOSCOPY Bilateral 01/09/2016   Procedure: VIDEO BRONCHOSCOPY WITHOUT FLUORO;  Surgeon: Juanito Doom, MD;  [facility];  Service: Cardiopulmonary;  Laterality: Bilateral;       Home  Medications    Prior to Admission medications   Medication Sig Start Date End Date Taking? Authorizing Provider  doxycycline (VIBRAMYCIN) 100 MG capsule Take 1 capsule (100 mg total) by mouth 2 (two) times daily for 10 days. 05/15/21 05/25/21 Yes Eliezer Lofts, FNP  predniSONE (DELTASONE) 20 MG tablet Take 3 tabs PO daily x 5 days. 05/15/21  Yes Eliezer Lofts, FNP  albuterol (PROVENTIL) (2.5 MG/3ML) 0.083% nebulizer solution USE 1 VIAL VIA NEBULIZER EVERY 6 HOURS AS NEEDED FOR WHEEZING OR SHORTNESS OF BREATH 07/18/19   Olalere, Cicero Duck A, MD  amiodarone (PACERONE) 200 MG tablet Take 1 tablet (200 mg total) by mouth daily. 11/22/20   Belva Crome, MD  apixaban (ELIQUIS) 5 MG TABS tablet Take 1 tablet (5 mg total) by mouth 2 (two) times daily. 03/26/21   Belva Crome, MD  Artificial Tear Solution (SOOTHE XP OP) Place 2 drops into both eyes daily as needed (dry eyes).    [provider]  benazepril (LOTENSIN) 10 MG tablet Take 1 tablet (10 mg total) by mouth daily. 11/22/20   Belva Crome, MD  budesonide-formoterol [facility]) 160-4.5 MCG/ACT inhaler Inhale 2 puffs into the lungs 2 (two) times daily. Patient not taking: Reported on 05/08/2021 05/10/20   Yvonna Alanis, NP  buPROPion (WELLBUTRIN) 75 MG tablet Take 1 tablet (75 mg total) by mouth daily. 11/29/20   Fargo, Amy E, NP  busPIRone (BUSPAR) 15 MG tablet TAKE 1 TABLET BY MOUTH TWICE A DAY 09/16/20   Fargo, Amy E, NP  carvedilol (COREG) 3.125 MG tablet Take 1 tablet (3.125 mg total) by mouth 2 (two) times daily. 11/22/20   Belva Crome, MD  cetirizine (ZYRTEC) 10 MG tablet Take 1 tablet by mouth daily as needed. 12/16/17   [provider]  Cyanocobalamin (VITAMIN B-12) 5000 MCG SUBL Place under the tongue daily.    [provider]  escitalopram (LEXAPRO) 20 MG tablet Take 1 tablet (20 mg total) by mouth daily. 02/16/21   Fargo, Amy E, NP  finasteride (PROSCAR) 5 MG tablet Take 5 mg by mouth daily. 06/24/20   [provider]  fluticasone (FLONASE) 50 MCG/ACT nasal spray Place 2 sprays into both nostrils daily. 02/13/21   Fargo, Amy E, NP  Fluticasone-Umeclidin-Vilant (TRELEGY ELLIPTA) 200-62.5-25 MCG/ACT AEPB Inhale 1 puff into the lungs daily.    [provider]  furosemide (LASIX) 40 MG tablet TAKE 40 MG EVERY OTHER DAY AND 20 MG ON THE ALTERNATIVE DAYS 04/01/21   Imogene Burn, PA-C  gabapentin (NEURONTIN) 300 MG capsule TAKE ONE CAPSULE BY MOUTH THREE TIMES A DAY 05/06/21   Fargo, Amy E, NP  Ipratropium-Albuterol (COMBIVENT RESPIMAT) 20-100 MCG/ACT AERS respimat Inhale 1 puff into the lungs every 6 (six) hours. Shortness of breath or wheezing 05/10/20   Fargo, Amy E, NP  IRON PO Take by mouth daily.     [provider]  MAGNESIUM-OXIDE 400 (241.3 Mg) MG tablet TAKE 1 TABSULE BY MOUTH DAILY 11/24/18   Hoyt Koch, MD  mexiletine (MEXITIL) 200 MG capsule Take 1 capsule (200 mg total) by mouth 2 (two) times daily. 03/17/21   Belva Crome, MD  Multiple Vitamins-Minerals (CENTRUM ADULTS PO) Take by mouth daily.    [provider]  nitroGLYCERIN (NITROSTAT) 0.4 MG SL tablet Place 1 tablet (0.4 mg total) under the tongue every 5 (five) minutes as needed for chest pain. DISSOLVE 1 TABLET UNDER THE TONGUE EVERY 5 MINUTES FOR 3 DOSES 05/08/21   Belva Crome, MD  omeprazole (PRILOSEC) 20 MG capsule Take 20 mg by mouth daily.     [provider]  potassium chloride SA (KLOR-CON M20) 20 MEQ tablet TAKE 40 MG EVERY OTHER DAY AND 20 MG ON THE ALTERNATIVE DAYS. 04/01/21   Imogene Burn, PA-C  Respiratory Therapy Supplies (FLUTTER) DEVI Use as directed 03/16/17   Juanito Doom, MD  rosuvastatin (CRESTOR) 10 MG tablet Take 1 tablet (10 mg total) by mouth daily. 05/07/21   [name], MD  Sennosides-Docusate Sodium (STOOL SOFTENER/LAXATIVE PO) Take 2 tablets by mouth daily.    [provider]  Spacer/Aero-Holding Chambers [facility] DIAMOND) [place]  optichamber [facility] 09/12/19   Olalere, Ernesto Rutherford, MD  tamsulosin (FLOMAX) 0.4 MG CAPS capsule Take one capsule by mouth once daily after supper. 12/30/20   Fargo, Amy E, NP  Tiotropium Bromide Monohydrate (SPIRIVA RESPIMAT) 2.5 MCG/ACT AERS Inhale 2 puffs into the lungs daily. Patient not taking:  Reported on 05/08/2021 04/09/20   Yvonna Alanis, NP    Family History Family History  Problem Relation Age of Onset   Heart attack Brother    CAD Father    Hypertension Father    CAD Mother    Hypertension Mother    Hypertension Brother    Stroke Neg Hx     Social History Social History   Tobacco Use   Smoking status: Former    Packs/day: 1.00    Years: 55.00    Pack years: 55.00    Types: Cigarettes   Smokeless tobacco: Never   Tobacco comments:    off/on, always ready to quit but does not work out  Scientific laboratory technician Use: Never used  Substance Use Topics   Alcohol use: Yes    Alcohol/week: 0.0 standard drinks    Comment: occ   Drug use: No     Allergies   Sulfa antibiotics   Review of Systems Review of Systems  HENT:  Positive for sore throat.   Respiratory:  Positive for cough and shortness of breath.   Neurological:  Positive for headaches.  All other systems reviewed and are negative.   Physical Exam Triage Vital Signs ED Triage Vitals  Enc Vitals Group     BP 05/15/21 1503 114/72     Pulse Rate 05/15/21 1503 79     Resp 05/15/21 1503 16     Temp 05/15/21 1503 98.2 F (36.8 C)     Temp Source 05/15/21 1503 Oral     SpO2 05/15/21 1503 97 %     Weight --      Height --      Head Circumference --      Peak Flow --      Pain Score 05/15/21 1508 0     Pain Loc --      Pain Edu? --      Excl. in [place]? --    No data found.  Updated Vital Signs BP 114/72 (BP Location: Right Arm)    Pulse 79    Temp 98.2 F (36.8 C) (Oral)    Resp 16    SpO2 97%      Physical Exam Vitals and nursing note reviewed.  Constitutional:      General: He is not in acute  distress.    Appearance: He is well-developed and normal weight. He is not ill-appearing.  HENT:     Head: Normocephalic and atraumatic.     Right Ear: Tympanic membrane, ear canal and external ear normal.     Left Ear: Tympanic membrane, ear canal and external ear normal.     Mouth/Throat:     Mouth: Mucous membranes are moist.     Pharynx: Oropharynx is clear.  Eyes:     Extraocular Movements: Extraocular movements intact.     Conjunctiva/sclera: Conjunctivae normal.     Pupils: Pupils are equal, round, and reactive to light.  Cardiovascular:     Rate and Rhythm: Normal rate and regular rhythm.     Pulses: Normal pulses.     Heart sounds: Normal heart sounds.  Pulmonary:     Effort: Pulmonary effort is normal. No respiratory distress.     Breath sounds: Normal breath sounds. No stridor. No wheezing, rhonchi or rales.  Musculoskeletal:     Cervical back: Normal range of motion and neck supple.  Skin:    General: Skin is warm and dry.  Neurological:     General:  No focal deficit present.     Mental Status: He is alert and oriented to person, place, and time.     UC Treatments / Results  Labs (all labs ordered are listed, but only abnormal results are displayed) Labs Reviewed  POC SARS CORONAVIRUS 2 AG -  ED    EKG   Radiology No results found.  Procedures Procedures (including critical care time)  Medications Ordered in UC Medications - No data to display  Initial Impression / Assessment and Plan / UC Course  I have reviewed the triage vital signs and the nursing notes.  Pertinent labs & imaging results that were available during my care of the patient were reviewed by me and considered in my medical decision making (see chart for details).     MDM: 1.  Subacute maxillary sinusitis-Rx'd Doxycycline; 2.  Cough-Rx'd prednisone; 3.  COPD exacerbation-Rx'd prednisone. Advised patient to take medication as directed with food to completion.  Advised patient to take  prednisone with first dose of Doxycycline for the next 5 of 10 days.  Encouraged patient to increase daily water intake while taking these medications.  Patient discharged home, hemodynamically stable. Final Clinical Impressions(s) / UC Diagnoses   Final diagnoses:  Cough, unspecified type  Subacute maxillary sinusitis  COPD exacerbation [facility])     Discharge Instructions      Advised patient to take medication as directed with food to completion.  Advised patient to take prednisone with first dose of Doxycycline for the next 5 of 10 days.  Encouraged patient to increase daily water intake while taking these medications.     ED Prescriptions     Medication Sig Dispense Auth. Provider   doxycycline (VIBRAMYCIN) 100 MG capsule Take 1 capsule (100 mg total) by mouth 2 (two) times daily for 10 days. 20 capsule Eliezer Lofts, FNP   predniSONE (DELTASONE) 20 MG tablet Take 3 tabs PO daily x 5 days. 15 tablet Eliezer Lofts, FNP      PDMP not reviewed this encounter.   Eliezer Lofts, [place] 05/15/21 [id number]

## 2021-05-16 ENCOUNTER — Ambulatory Visit
Admission: RE | Admit: 2021-05-16 | Discharge: 2021-05-16 | Disposition: A | Discharge status: Home / Self Care | Payer: Medicare Other | Source: Ambulatory Visit | Attending: Physician Assistant | Admitting: Physician Assistant

## 2021-05-16 ENCOUNTER — Ambulatory Visit (INDEPENDENT_AMBULATORY_CARE_PROVIDER_SITE_OTHER)
Admission: RE | Admit: 2021-05-16 | Discharge: 2021-05-16 | Disposition: A | Discharge status: Home / Self Care | Payer: Medicare Other | Source: Ambulatory Visit | Attending: Physician Assistant | Admitting: Physician Assistant

## 2021-05-16 DIAGNOSIS — R609 Edema, unspecified: Secondary | ICD-10-CM | POA: Diagnosis not present

## 2021-05-16 DIAGNOSIS — J449 Chronic obstructive pulmonary disease, unspecified: Secondary | ICD-10-CM

## 2021-05-16 DIAGNOSIS — I714 Abdominal aortic aneurysm, without rupture, unspecified: Secondary | ICD-10-CM

## 2021-05-16 DIAGNOSIS — I5022 Chronic systolic (congestive) heart failure: Secondary | ICD-10-CM

## 2021-05-16 DIAGNOSIS — I472 Ventricular tachycardia, unspecified: Secondary | ICD-10-CM

## 2021-05-16 DIAGNOSIS — I480 Paroxysmal atrial fibrillation: Secondary | ICD-10-CM

## 2021-05-16 DIAGNOSIS — I10 Essential (primary) hypertension: Secondary | ICD-10-CM

## 2021-05-16 DIAGNOSIS — I25810 Atherosclerosis of coronary artery bypass graft(s) without angina pectoris: Secondary | ICD-10-CM

## 2021-05-16 DIAGNOSIS — I255 Ischemic cardiomyopathy: Secondary | ICD-10-CM

## 2021-05-16 MED ORDER — IOHEXOL 350 MG/ML SOLN
100.0000 mL | Freq: Once | INTRAVENOUS | Status: AC | PRN
  Administered 2021-05-16: 100 mL via INTRAVENOUS

## 2021-05-19 ENCOUNTER — Ambulatory Visit (INDEPENDENT_AMBULATORY_CARE_PROVIDER_SITE_OTHER): Payer: Medicare Other

## 2021-05-19 DIAGNOSIS — I255 Ischemic cardiomyopathy: Secondary | ICD-10-CM | POA: Diagnosis not present

## 2021-05-20 LAB — CUP PACEART REMOTE DEVICE CHECK
Battery Remaining Longevity: 12 mo
Battery Remaining Percentage: 14 %
Brady Statistic RA Percent Paced: 2 %
Brady Statistic RV Percent Paced: 0 %
Date Time Interrogation Session: 20230221104900
HighPow Impedance: 54 Ohm
Implantable Lead Implant Date: 20110610
Implantable Lead Implant Date: 20110610
Implantable Lead Location: 753859
Implantable Lead Location: 753860
Implantable Lead Model: 185
Implantable Lead Model: 4135
Implantable Lead Serial Number: 28681386
Implantable Lead Serial Number: 339643
Implantable Pulse Generator Implant Date: 20110610
Lead Channel Impedance Value: 449 Ohm
Lead Channel Impedance Value: 521 Ohm
Lead Channel Pacing Threshold Amplitude: 0.9 V
Lead Channel Pacing Threshold Amplitude: 1.1 V
Lead Channel Pacing Threshold Pulse Width: 0.4 ms
Lead Channel Pacing Threshold Pulse Width: 0.4 ms
Lead Channel Setting Pacing Amplitude: 2 V
Lead Channel Setting Pacing Amplitude: 2.2 V
Lead Channel Setting Pacing Pulse Width: 0.4 ms
Lead Channel Setting Sensing Sensitivity: 0.6 mV
Pulse Gen Serial Number: 164892

## 2021-05-21 ENCOUNTER — Other Ambulatory Visit: Payer: Self-pay

## 2021-05-21 DIAGNOSIS — I714 Abdominal aortic aneurysm, without rupture, unspecified: Secondary | ICD-10-CM

## 2021-05-23 NOTE — Progress Notes (Signed)
Remote ICD transmission.   

## 2021-06-04 ENCOUNTER — Other Ambulatory Visit: Payer: Medicare Other | Admitting: *Deleted

## 2021-06-04 ENCOUNTER — Other Ambulatory Visit: Payer: Self-pay

## 2021-06-04 DIAGNOSIS — E7849 Other hyperlipidemia: Secondary | ICD-10-CM

## 2021-06-04 LAB — HEPATIC FUNCTION PANEL
ALT: 29 IU/L (ref 0–44)
AST: 25 IU/L (ref 0–40)
Albumin: 4.3 g/dL (ref 3.6–4.6)
Alkaline Phosphatase: 54 IU/L (ref 44–121)
Bilirubin Total: 0.7 mg/dL (ref 0.0–1.2)
Bilirubin, Direct: 0.23 mg/dL (ref 0.00–0.40)
Total Protein: 6.5 g/dL (ref 6.0–8.5)

## 2021-06-04 LAB — LIPID PANEL
Chol/HDL Ratio: 2.8 ratio (ref 0.0–5.0)
Cholesterol, Total: 162 mg/dL (ref 100–199)
HDL: 58 mg/dL (ref >39)
LDL Chol Calc (NIH): 88 mg/dL (ref 0–99)
Triglycerides: 83 mg/dL (ref 0–149)
VLDL Cholesterol Cal: 16 mg/dL (ref 5–40)

## 2021-06-05 ENCOUNTER — Telehealth: Payer: Self-pay | Admitting: Pharmacist

## 2021-06-05 DIAGNOSIS — I25810 Atherosclerosis of coronary artery bypass graft(s) without angina pectoris: Secondary | ICD-10-CM

## 2021-06-05 MED ORDER — ROSUVASTATIN CALCIUM 40 MG PO TABS: 40.0000 mg | ORAL_TABLET | Freq: Every day | ORAL | 3 refills | Status: DC

## 2021-06-05 NOTE — Telephone Encounter (Signed)
Labs reflect pt on rosuvastatin '10mg'$  daily. His LDL goal is < 55, he was started on lower dose of rosuvastatin given prior mild elevation in LFTs on atorvastatin '80mg'$  daily (ALT 50-60s), although LFTs didn't change when atorvastatin was held, may not have been related. He was referred for GI follow up, don't see that he's had a visit yet. LFTs normal now. Will increase rosuvastatin to '40mg'$  daily and recheck lipids and LFTs in 4-6 weeks. Pt aware of lab results and med plan. ?

## 2021-06-17 ENCOUNTER — Other Ambulatory Visit: Payer: Self-pay

## 2021-06-17 ENCOUNTER — Encounter: Payer: Self-pay | Admitting: Family

## 2021-06-17 ENCOUNTER — Ambulatory Visit: Payer: Medicare Other | Admitting: Family

## 2021-06-17 VITALS — BP 118/68 | HR 63 | Temp 97.7°F | Ht 72.0 in | Wt 210.2 lb

## 2021-06-17 DIAGNOSIS — L03115 Cellulitis of right lower limb: Secondary | ICD-10-CM

## 2021-06-17 MED ORDER — DOXYCYCLINE HYCLATE 100 MG PO TABS: 100.0000 mg | ORAL_TABLET | Freq: Two times a day (BID) | ORAL | 0 refills | Status: AC

## 2021-06-17 NOTE — Progress Notes (Signed)
? ?Provider: Marlowe Sax FNP-C ? ?Yvonna Alanis, NP ? ?Patient Care Team: ?Yvonna Alanis, NP as PCP - General (Adult Health Nurse Practitioner) ?Belva Crome, MD as PCP - Cardiology (Cardiology) ?[name], MD as PCP - Electrophysiology (Cardiology) ?Roel Cluck, MD as Referring Physician (Ophthalmology) ?Harriett Sine, MD as Consulting Physician (Dermatology) ? ?Extended Emergency Contact Information ?Primary Emergency Contact: Denham,[name] ?Address: [address] ?         [address] ?         [place], [place] [address] [place] ?Home Phone: [phone] ?Mobile Phone: [phone] ?Relation: Spouse ?Secondary Emergency Contact: Smith,Kendall ?         [place], [place] [address] [place] ?Home Phone: [phone] ?Mobile Phone: [phone] ?Relation: Relative ? ?Code Status: Full Code  ?Goals of care: Advanced Directive information ?Advanced Directives 06/17/2021  ?Does Patient Have a Medical Advance Directive? No  ?Does patient want to make changes to medical advance directive? -  ?Would patient like information on creating a medical advance directive? No - Patient declined  ? ? ? ?Chief Complaint  ?Patient presents with  ? Acute Visit  ?  Patient complains of right leg cellulitis. Patient stated he noticed it about a week ago. Patient stated that it is red but not warm to the touch.   ? ? ?HPI:  ?Pt is a 81 y.o. male seen today for an acute visit for evaluation of right leg redness.Has some swelling but does not feel warmer than the other leg.  He denies any fever,chills or drainage ?Currently taking a Furosemide whole tablet and 1/2 tablet every other day.on low sodium diet ,tries to elevate leg.  He denies any new onset of cough, wheezing or shortness of breath. 2 pounds weight gain noted since he was last seen in February, 2023 ? ?Past Medical History:  ?Diagnosis Date  ? AICD (automatic cardioverter/defibrillator) present   ? Aneurysm ([place])   ? a. Aneurysmal  infrarenal aorta up to 33 mm on CT 10/2014, recommended f/u due 10/2017  ? Anginal pain ([place])   ? Anxiety   ? Basal cell carcinoma of nose   ? S/P MOHS  ? Biliary acute pancreatitis   ? CAD (coronary artery disease)   ? a. s/p MI in 1994 with PCI to LAD at that time b. cath 10/2012 demonstrated EF 30%, inferior akinesis with mild hypokinesis of all walls, patent LAD and RCA stents; ostial PDA with 80-90% obstruction with medical therapy recommended   ? Chronic systolic CHF (congestive heart failure) ([place])   ? EF 30 to 35 % as of 09/2014.   ? CKD (chronic kidney disease), stage III ([place])   ? Complication of anesthesia 10/2014  ? "had to have defibrillator w/ERCP"  ? COPD (chronic obstructive pulmonary disease) ([place])   ? a. followed by pulmonary, COPD GOLD stage II  ? Depression   ? Diverticulosis of colon 07/2014  ? noted on CT  ? GERD (gastroesophageal reflux disease)   ? Hiatal hernia   ? Hyperglycemia 10/2012.  ? Hyperlipidemia   ? Hypertension   ? Myocardial infarction [facility]) 1994; 2011  ? Pneumonia 1946; 2015  ? Prostate enlargement 07/2014  ? observed on CT  ? Tobacco abuse   ? Ventricular tachycardia   ? a. 08/2009 s/p BSX E110 Teligen 100 AICD, ser#: [id number];  b. 08/2008 VT req ATP - detection reprogrammed from 160 to 150. c. EPS and VT ablation by Dr. Lovena Le  12/21/2014  ? ?Past Surgical History:  ?Procedure Laterality Date  ? BIOPSY  12/21/2017  ? Procedure: BIOPSY;  Surgeon: [name], MD;  Location[facility];  Service: Endoscopy;;  ? CATARACT EXTRACTION W/ INTRAOCULAR LENS  IMPLANT, BILATERAL Bilateral ~ 2011  ? COLONOSCOPY    ? COLONOSCOPY WITH PROPOFOL N/A 12/21/2017  ? Procedure: COLONOSCOPY WITH PROPOFOL;  Surgeon: [name], MD;  [facility];  Service: Endoscopy;  Laterality: N/A;  ? ELECTROPHYSIOLOGIC STUDY N/A 12/21/2014  ? Procedure: V Tach Ablation;  Surgeon: [name], MD;  Location: [place] CV [facility];  Service: Cardiovascular;  Laterality: N/A;  ? ERCP N/A 11/16/2014  ? Procedure:  ENDOSCOPIC RETROGRADE CHOLANGIOPANCREATOGRAPHY (ERCP);  Surgeon: [name], MD;  Location: [place];  Service: Endoscopy;  Laterality: N/A;  ? ESOPHAGOGASTRODUODENOSCOPY (EGD) WITH PROPOFOL N/A 12/21/2017  ? Procedure: ESOPHAGOGASTRODUODENOSCOPY (EGD) WITH PROPOFOL;  Surgeon: [name], MD;  [facility];  Service: Endoscopy;  Laterality: N/A;  ? EYE SURGERY    ? FOOT SURGERY Left 2005  ? "fixed bone that stuck out in my ankle area"  ? HEMORRHOID BANDING    ? IMPLANTABLE CARDIOVERTER DEFIBRILLATOR IMPLANT  09/06/09  ? BSX dual chamber ICD implanted in [place] for cardiac arrest 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  ? INGUINAL HERNIA REPAIR Right ~ 1995  ? LEFT HEART CATHETERIZATION WITH CORONARY ANGIOGRAM N/A 11/25/2012  ? demonstrated EF 30%, inferior akinesis with mild hypokinesis of all walls, patent LAD and RCA stents; ostial PDA with 80-90% obstruction with medical therapy recommended  ? MOHS SURGERY  2008  ? nose, skin graft  ? POLYPECTOMY  12/21/2017  ? Procedure: POLYPECTOMY;  Surgeon: [name], MD;  [facility];  Service: Endoscopy;;  ? RETINAL DETACHMENT SURGERY Right 2013  ? TENOLYSIS Right 12/21/2013  ? Procedure: TENOLYSIS FLEXOR CARPI RADIALIS ,DEBRIDEMENT RIGHT JOINT WRIST,DEBRIDEMENT SCAPHOTRAPEZIAL TRAPEZOID, REPAIR OF EXTENSOR HOOD;  Surgeon: Daryll Brod, MD;  Location: [place];  Service: Orthopedics;  Laterality: Right;  ? TOE SURGERY Right 09/2019  ? 3rd toe/hammer toe  ? V-TACH ABLATION  12/21/2014  ? VIDEO BRONCHOSCOPY Bilateral 01/09/2016  ? Procedure: VIDEO BRONCHOSCOPY WITHOUT FLUORO;  Surgeon: Juanito Doom, MD;  [facility];  Service: Cardiopulmonary;  Laterality: Bilateral;  ? ? ?Allergies  ?Allergen Reactions  ? Sulfa Antibiotics Hives  ? ? ?Outpatient Encounter Medications as of 06/17/2021  ?Medication Sig  ? albuterol (PROVENTIL) (2.5 MG/3ML) 0.083% nebulizer solution USE 1 VIAL VIA NEBULIZER EVERY 6 HOURS AS NEEDED FOR WHEEZING OR  SHORTNESS OF BREATH  ? amiodarone (PACERONE) 200 MG tablet Take 1 tablet (200 mg total) by mouth daily.  ? apixaban (ELIQUIS) 5 MG TABS tablet Take 1 tablet (5 mg total) by mouth 2 (two) times daily.  ? Artificial Tear Solution (SOOTHE XP OP) Place 2 drops into both eyes daily as needed (dry eyes).  ? benazepril (LOTENSIN) 10 MG tablet Take 1 tablet (10 mg total) by mouth daily.  ? buPROPion (WELLBUTRIN) 75 MG tablet Take 1 tablet (75 mg total) by mouth daily.  ? busPIRone (BUSPAR) 15 MG tablet TAKE 1 TABLET BY MOUTH TWICE A DAY  ? carvedilol (COREG) 3.125 MG tablet Take 1 tablet (3.125 mg total) by mouth 2 (two) times daily.  ? cetirizine (ZYRTEC) 10 MG tablet Take 1 tablet by mouth daily as needed.  ? Cyanocobalamin (VITAMIN B-12) 5000 MCG SUBL Place under the tongue daily.  ? escitalopram (LEXAPRO) 20 MG tablet Take  1 tablet (20 mg total) by mouth daily.  ? fluticasone (FLONASE) 50 MCG/ACT nasal spray Place 2 sprays into both nostrils daily.  ? Fluticasone-Umeclidin-Vilant (TRELEGY ELLIPTA) 200-62.5-25 MCG/ACT AEPB Inhale 1 puff into the lungs daily.  ? furosemide (LASIX) 40 MG tablet TAKE 40 MG EVERY OTHER DAY AND 20 MG ON THE ALTERNATIVE DAYS  ? gabapentin (NEURONTIN) 300 MG capsule TAKE ONE CAPSULE BY MOUTH THREE TIMES A DAY  ? Ipratropium-Albuterol (COMBIVENT RESPIMAT) 20-100 MCG/ACT AERS respimat Inhale 1 puff into the lungs every 6 (six) hours. Shortness of breath or wheezing  ? IRON PO Take by mouth daily.   ? MAGNESIUM-OXIDE 400 (241.3 Mg) MG tablet TAKE 1 TABSULE BY MOUTH DAILY  ? mexiletine (MEXITIL) 200 MG capsule Take 1 capsule (200 mg total) by mouth 2 (two) times daily.  ? Multiple Vitamins-Minerals (CENTRUM ADULTS PO) Take by mouth daily.  ? nitroGLYCERIN (NITROSTAT) 0.4 MG SL tablet Place 1 tablet (0.4 mg total) under the tongue every 5 (five) minutes as needed for chest pain. DISSOLVE 1 TABLET UNDER THE TONGUE EVERY 5 MINUTES FOR 3 DOSES  ? omeprazole (PRILOSEC) 20 MG capsule Take 20 mg by mouth  daily.   ? potassium chloride SA (KLOR-CON M20) 20 MEQ tablet TAKE 40 MG EVERY OTHER DAY AND 20 MG ON THE ALTERNATIVE DAYS.  ? predniSONE (DELTASONE) 20 MG tablet Take 3 tabs PO daily x 5 days.  ? Respirat

## 2021-06-25 ENCOUNTER — Encounter: Payer: Self-pay | Admitting: Interventional Cardiology

## 2021-06-28 ENCOUNTER — Other Ambulatory Visit: Payer: Self-pay | Admitting: Orthopedic Surgery

## 2021-07-14 ENCOUNTER — Other Ambulatory Visit: Payer: Medicare Other

## 2021-07-14 DIAGNOSIS — I25810 Atherosclerosis of coronary artery bypass graft(s) without angina pectoris: Secondary | ICD-10-CM

## 2021-07-14 LAB — LIPID PANEL
Chol/HDL Ratio: 2.5 ratio (ref 0.0–5.0)
Cholesterol, Total: 117 mg/dL (ref 100–199)
HDL: 47 mg/dL (ref >39)
LDL Chol Calc (NIH): 53 mg/dL (ref 0–99)
Triglycerides: 88 mg/dL (ref 0–149)
VLDL Cholesterol Cal: 17 mg/dL (ref 5–40)

## 2021-07-14 LAB — HEPATIC FUNCTION PANEL
ALT: 33 IU/L (ref 0–44)
AST: 33 IU/L (ref 0–40)
Albumin: 4.1 g/dL (ref 3.6–4.6)
Alkaline Phosphatase: 54 IU/L (ref 44–121)
Bilirubin Total: 0.7 mg/dL (ref 0.0–1.2)
Bilirubin, Direct: 0.27 mg/dL (ref 0.00–0.40)
Total Protein: 6.1 g/dL (ref 6.0–8.5)

## 2021-07-16 ENCOUNTER — Encounter: Payer: Self-pay | Admitting: Orthopedic Surgery

## 2021-07-17 ENCOUNTER — Ambulatory Visit: Payer: Medicare Other | Admitting: Orthopedic Surgery

## 2021-07-17 VITALS — BP 122/78 | HR 87 | Temp 98.0°F | Ht 72.0 in | Wt 207.4 lb

## 2021-07-17 DIAGNOSIS — L03116 Cellulitis of left lower limb: Secondary | ICD-10-CM

## 2021-07-17 DIAGNOSIS — R600 Localized edema: Secondary | ICD-10-CM | POA: Diagnosis not present

## 2021-07-17 DIAGNOSIS — L03115 Cellulitis of right lower limb: Secondary | ICD-10-CM

## 2021-07-17 DIAGNOSIS — G629 Polyneuropathy, unspecified: Secondary | ICD-10-CM

## 2021-07-17 DIAGNOSIS — M542 Cervicalgia: Secondary | ICD-10-CM | POA: Diagnosis not present

## 2021-07-17 DIAGNOSIS — I872 Venous insufficiency (chronic) (peripheral): Secondary | ICD-10-CM | POA: Diagnosis not present

## 2021-07-17 MED ORDER — MUPIROCIN 2 % EX OINT: 1.0000 "application " | TOPICAL_OINTMENT | Freq: Two times a day (BID) | CUTANEOUS | 1 refills | Status: DC

## 2021-07-17 NOTE — Patient Instructions (Addendum)
Continue current furosemide/Lasix as prescribed ? ?Continue compression stockings ? ?Try to always keep legs covered to avoid skin breakdown ? ?Try Dr. Zoe Lan shoe inserts to help with heel pain ? ?Start taking gabapentin at night to see if it helps with drowsiness ? ?Signs of infection: creamy drainage, increased pain, increased redness, fever- you develop very bad pain or fever notify provider ?

## 2021-07-17 NOTE — Progress Notes (Signed)
? ? ?Careteam: ?Patient Care Team: ?Yvonna Alanis, NP as PCP - General (Adult Health Nurse Practitioner) ?Belva Crome, MD as PCP - Cardiology (Cardiology) ?[name], MD as PCP - Electrophysiology (Cardiology) ?Roel Cluck, MD as Referring Physician (Ophthalmology) ?Harriett Sine, MD as Consulting Physician (Dermatology) ?[name], MD as Consulting Physician (Gastroenterology) ? ?Seen by: [name], AGNP-C ? ?PLACE OF SERVICE:  ?[facility]  ?Advanced Directive information ?Does Patient Have a Medical Advance Directive?: No, Would patient like information on creating a medical advance directive?: No - Patient declined ? ?Allergies  ?Allergen Reactions  ? Sulfa Antibiotics Hives  ? ? ?Chief Complaint  ?Patient presents with  ? Acute Visit  ?  Discuss right leg issues.  ? ? ? ?HPI: Patient is a 81 y.o. male seen today for acute visit due to leg infection.  ? ?He has been treated with antibiotics > 5x for cellulitis within the past year. Denies fever, purulent drainage, increased pain or redness. Skin to lower extremities fragile. He has multiple tiny abrasions to right lower ankle and other extremities. Seeking medical attention today to r/o infection.  ? ?Seen by cardiology 04/2021 for edema. He was advised to start taking furosemide 40 mg QOD and furosemide 20 mg QOD. He reports improved swelling since medication change.  ? ?Neck pain ongoing. Some radiation down left arm, feel numb at times. He has purchased a new pillow. He is thinking about seeing specialist again. He has only been taking gabapentin once daily every morning. Advised him to take dose at night.  ? ? ? ?Review of Systems:  ?Review of Systems  ?Constitutional:  Negative for chills, fever and malaise/fatigue.  ?Respiratory:  Negative for cough, shortness of breath and wheezing.   ?Cardiovascular:  Positive for leg swelling. Negative for chest pain.  ?Musculoskeletal:  Positive for joint pain and neck pain. Negative for falls.   ?Skin:   ?     wound  ?Neurological:  Positive for tingling and sensory change. Negative for dizziness, weakness and headaches.  ?Psychiatric/Behavioral:  Negative for depression. The patient is not nervous/anxious and does not have insomnia.   ? ?Past Medical History:  ?Diagnosis Date  ? AICD (automatic cardioverter/defibrillator) present   ? Aneurysm ([place])   ? a. Aneurysmal infrarenal aorta up to 33 mm on CT 10/2014, recommended f/u due 10/2017  ? Anginal pain ([place])   ? Anxiety   ? Basal cell carcinoma of nose   ? S/P MOHS  ? Biliary acute pancreatitis   ? CAD (coronary artery disease)   ? a. s/p MI in 1994 with PCI to LAD at that time b. cath 10/2012 demonstrated EF 30%, inferior akinesis with mild hypokinesis of all walls, patent LAD and RCA stents; ostial PDA with 80-90% obstruction with medical therapy recommended   ? Chronic systolic CHF (congestive heart failure) ([place])   ? EF 30 to 35 % as of 09/2014.   ? CKD (chronic kidney disease), stage III ([place])   ? Complication of anesthesia 10/2014  ? "had to have defibrillator w/ERCP"  ? COPD (chronic obstructive pulmonary disease) ([place])   ? a. followed by pulmonary, COPD GOLD stage II  ? Depression   ? Diverticulosis of colon 07/2014  ? noted on CT  ? GERD (gastroesophageal reflux disease)   ? Hiatal hernia   ? Hyperglycemia 10/2012.  ? Hyperlipidemia   ? Hypertension   ? Myocardial infarction [facility]) 1994; 2011  ? Pneumonia 1946; 2015  ?  Prostate enlargement 07/2014  ? observed on CT  ? Tobacco abuse   ? Ventricular tachycardia ([place])   ? a. 08/2009 s/p BSX E110 Teligen 100 AICD, ser#: [id number];  b. 08/2008 VT req ATP - detection reprogrammed from 160 to 150. c. EPS and VT ablation by Dr. Lovena Le 12/21/2014  ? ?Past Surgical History:  ?Procedure Laterality Date  ? BIOPSY  12/21/2017  ? Procedure: BIOPSY;  Surgeon: [name], MD;  Location: [facility];  Service: Endoscopy;;  ? CATARACT EXTRACTION W/ INTRAOCULAR LENS  IMPLANT, BILATERAL Bilateral ~ 2011  ? COLONOSCOPY    ?  COLONOSCOPY WITH PROPOFOL N/A 12/21/2017  ? Procedure: COLONOSCOPY WITH PROPOFOL;  Surgeon: [name], MD;  [facility]: [facility];  Service: Endoscopy;  Laterality: N/A;  ? ELECTROPHYSIOLOGIC STUDY N/A 12/21/2014  ? Procedure: V Tach Ablation;  Surgeon: [name], MD;  Location: [facility] [facility];  Service: Cardiovascular;  Laterality: N/A;  ? ERCP N/A 11/16/2014  ? Procedure: ENDOSCOPIC RETROGRADE CHOLANGIOPANCREATOGRAPHY (ERCP);  Surgeon: [name], MD;  Location: [facility];  Service: Endoscopy;  Laterality: N/A;  ? ESOPHAGOGASTRODUODENOSCOPY (EGD) WITH PROPOFOL N/A 12/21/2017  ? Procedure: ESOPHAGOGASTRODUODENOSCOPY (EGD) WITH PROPOFOL;  Surgeon: [name], MD;  [facility];  Service: Endoscopy;  Laterality: N/A;  ? EYE SURGERY    ? FOOT SURGERY Left 2005  ? "fixed bone that stuck out in my ankle area"  ? HEMORRHOID BANDING    ? IMPLANTABLE CARDIOVERTER DEFIBRILLATOR IMPLANT  09/06/09  ? BSX dual chamber ICD implanted in [place] for cardiac arrest 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  ? INGUINAL HERNIA REPAIR Right ~ 1995  ? LEFT HEART CATHETERIZATION WITH CORONARY ANGIOGRAM N/A 11/25/2012  ? demonstrated EF 30%, inferior akinesis with mild hypokinesis of all walls, patent LAD and RCA stents; ostial PDA with 80-90% obstruction with medical therapy recommended  ? MOHS SURGERY  2008  ? nose, skin graft  ? POLYPECTOMY  12/21/2017  ? Procedure: POLYPECTOMY;  Surgeon: [name], MD;  Location: [facility];  Service: Endoscopy;;  ? RETINAL DETACHMENT SURGERY Right 2013  ? TENOLYSIS Right 12/21/2013  ? Procedure: TENOLYSIS FLEXOR CARPI RADIALIS ,DEBRIDEMENT RIGHT JOINT WRIST,DEBRIDEMENT SCAPHOTRAPEZIAL TRAPEZOID, REPAIR OF EXTENSOR HOOD;  Surgeon: Daryll Brod, MD;  Location: [facility];  Service: Orthopedics;  Laterality: Right;  ? TOE SURGERY Right 09/2019  ? 3rd toe/hammer toe  ? V-TACH ABLATION  12/21/2014  ? VIDEO BRONCHOSCOPY Bilateral 01/09/2016  ? Procedure: VIDEO BRONCHOSCOPY  WITHOUT FLUORO;  Surgeon: Juanito Doom, MD;  [facility];  Service: Cardiopulmonary;  Laterality: Bilateral;  ? ?Social History: ?  reports that he has quit smoking. His smoking use included cigarettes. He has a 55.00 pack-year smoking history. He has never used smokeless tobacco. He reports current alcohol use. He reports that he does not use drugs. ? ?Family History  ?Problem Relation Age of Onset  ? Heart attack Brother   ? CAD Father   ? Hypertension Father   ? CAD Mother   ? Hypertension Mother   ? Hypertension Brother   ? Stroke Neg Hx   ? ? ?Medications: ?Patient's Medications  ?New Prescriptions  ? No medications on file  ?Previous Medications  ? ALBUTEROL (PROVENTIL) (2.5 MG/3ML) 0.083% NEBULIZER SOLUTION    USE 1 VIAL VIA NEBULIZER EVERY 6 HOURS AS NEEDED FOR WHEEZING OR SHORTNESS OF BREATH  ? AMIODARONE (PACERONE) 200 MG TABLET    Take 1 tablet (200 mg total) by mouth daily.  ?  APIXABAN (ELIQUIS) 5 MG TABS TABLET    Take 1 tablet (5 mg total) by mouth 2 (two) times daily.  ? ARTIFICIAL TEAR SOLUTION (SOOTHE XP OP)    Place 2 drops into both eyes daily as needed (dry eyes).  ? BENAZEPRIL (LOTENSIN) 10 MG TABLET    Take 1 tablet (10 mg total) by mouth daily.  ? BUPROPION (WELLBUTRIN) 75 MG TABLET    Take 1 tablet (75 mg total) by mouth daily.  ? BUSPIRONE (BUSPAR) 15 MG TABLET    TAKE 1 TABLET BY MOUTH TWICE A DAY  ? CARVEDILOL (COREG) 3.125 MG TABLET    Take 1 tablet (3.125 mg total) by mouth 2 (two) times daily.  ? CETIRIZINE (ZYRTEC) 10 MG TABLET    Take 1 tablet by mouth daily as needed.  ? CYANOCOBALAMIN (VITAMIN B-12) 5000 MCG SUBL    Place under the tongue daily.  ? ESCITALOPRAM (LEXAPRO) 20 MG TABLET    Take 1 tablet (20 mg total) by mouth daily.  ? FLUTICASONE (FLONASE) 50 MCG/ACT NASAL SPRAY    Place 2 sprays into both nostrils daily.  ? FLUTICASONE-UMECLIDIN-VILANT (TRELEGY ELLIPTA) 200-62.5-25 MCG/ACT AEPB    Inhale 1 puff into the lungs daily.  ? FUROSEMIDE (LASIX) 40 MG TABLET     TAKE 40 MG EVERY OTHER DAY AND 20 MG ON THE ALTERNATIVE DAYS  ? GABAPENTIN (NEURONTIN) 300 MG CAPSULE    TAKE ONE CAPSULE BY MOUTH THREE TIMES A DAY  ? IPRATROPIUM-ALBUTEROL (COMBIVENT RESPIMAT) 20-100 MC

## 2021-07-18 MED ORDER — GABAPENTIN 300 MG PO CAPS: 300.0000 mg | ORAL_CAPSULE | Freq: Two times a day (BID) | ORAL | 2 refills | Status: DC

## 2021-07-21 ENCOUNTER — Other Ambulatory Visit: Payer: Self-pay | Admitting: Interventional Cardiology

## 2021-07-21 DIAGNOSIS — I472 Ventricular tachycardia, unspecified: Secondary | ICD-10-CM

## 2021-08-14 ENCOUNTER — Other Ambulatory Visit: Payer: Self-pay | Admitting: Orthopedic Surgery

## 2021-08-14 NOTE — Telephone Encounter (Signed)
High risk warning populated when attempting to approve refill request. Will send to [place], Amy E, NP to review.   

## 2021-08-18 ENCOUNTER — Ambulatory Visit (INDEPENDENT_AMBULATORY_CARE_PROVIDER_SITE_OTHER): Payer: Medicare Other

## 2021-08-18 DIAGNOSIS — I472 Ventricular tachycardia, unspecified: Secondary | ICD-10-CM | POA: Diagnosis not present

## 2021-08-23 LAB — CUP PACEART REMOTE DEVICE CHECK
Battery Remaining Longevity: 12 mo
Battery Remaining Percentage: 13 %
Brady Statistic RA Percent Paced: 1 %
Brady Statistic RV Percent Paced: 0 %
Date Time Interrogation Session: 20230525102100
HighPow Impedance: 52 Ohm
Implantable Lead Implant Date: 20110610
Implantable Lead Implant Date: 20110610
Implantable Lead Location: 753859
Implantable Lead Location: 753860
Implantable Lead Model: 185
Implantable Lead Model: 4135
Implantable Lead Serial Number: 28681386
Implantable Lead Serial Number: 339643
Implantable Pulse Generator Implant Date: 20110610
Lead Channel Impedance Value: 438 Ohm
Lead Channel Impedance Value: 587 Ohm
Lead Channel Pacing Threshold Amplitude: 0.9 V
Lead Channel Pacing Threshold Amplitude: 1.1 V
Lead Channel Pacing Threshold Pulse Width: 0.4 ms
Lead Channel Pacing Threshold Pulse Width: 0.4 ms
Lead Channel Setting Pacing Amplitude: 2 V
Lead Channel Setting Pacing Amplitude: 2.2 V
Lead Channel Setting Pacing Pulse Width: 0.4 ms
Lead Channel Setting Sensing Sensitivity: 0.6 mV
Pulse Gen Serial Number: 164892

## 2021-09-02 NOTE — Progress Notes (Deleted)
Electrophysiology Office Note Date: 09/02/2021  ID:  Steven Guzman, Steven Guzman [birth year], MRN [id number]  PCP: Yvonna Alanis, NP Primary Cardiologist: Sinclair Grooms, MD Electrophysiologist: Cristopher Peru, MD   CC: Routine ICD follow-up  Steven Guzman is a 81 y.o. male seen today for Cristopher Peru, MD for routine electrophysiology followup.  Since last being seen in our clinic the patient reports doing ***.  he denies chest pain, palpitations, dyspnea, PND, orthopnea, nausea, vomiting, dizziness, syncope, edema, weight gain, or early satiety. He has not had ICD shocks.   Device History: BSCi dual chamber ICD implanted 09/06/2009   Arrhythmia hx Dec 2015 PMVT with ICD w/HV therapies (in setting of missed meds) 2016 VT tx w/ATP  12/22/2014 EPS/ablation intereval notes report slow VT's observed, pace terminated in the office 07/21/2016 and in the hospital Jun 2018    AAD Hx Amiodarone an active medicine at least since 2014 mexilletine started April 2018  Past Medical History:  Diagnosis Date   AICD (automatic cardioverter/defibrillator) present    Aneurysm ([place])    a. Aneurysmal infrarenal aorta up to 33 mm on CT 10/2014, recommended f/u due 10/2017   Anginal pain ([place])    Anxiety    Basal cell carcinoma of nose    S/P MOHS   Biliary acute pancreatitis    CAD (coronary artery disease)    a. s/p MI in 1994 with PCI to LAD at that time b. cath 10/2012 demonstrated EF 30%, inferior akinesis with mild hypokinesis of all walls, patent LAD and RCA stents; ostial PDA with 80-90% obstruction with medical therapy recommended    Chronic systolic CHF (congestive heart failure) ([facility])    EF 30 to 35 % as of 09/2014.    CKD (chronic kidney disease), stage III ([place])    Complication of anesthesia 10/2014   "had to have defibrillator w/ERCP"   COPD (chronic obstructive pulmonary disease) ([place])    a. followed by pulmonary, COPD GOLD stage II   Depression    Diverticulosis of colon 07/2014   noted  on CT   GERD (gastroesophageal reflux disease)    Hiatal hernia    Hyperglycemia 10/2012.   Hyperlipidemia    Hypertension    Myocardial infarction [facility]) 1994; 2011   Pneumonia 1946; 2015   Prostate enlargement 07/2014   observed on CT   Tobacco abuse    Ventricular tachycardia ([place])    a. 08/2009 s/p BSX E110 Teligen 100 AICD, ser#: [id number];  b. 08/2008 VT req ATP - detection reprogrammed from 160 to 150. c. EPS and VT ablation by Dr. Lovena Le 12/21/2014   Past Surgical History:  Procedure Laterality Date   BIOPSY  12/21/2017   Procedure: BIOPSY;  Surgeon: [name], MD;  [facility];  Service: Endoscopy;;   CATARACT EXTRACTION W/ INTRAOCULAR LENS  IMPLANT, BILATERAL Bilateral ~ 2011   COLONOSCOPY     COLONOSCOPY WITH PROPOFOL N/A 12/21/2017   Procedure: COLONOSCOPY WITH PROPOFOL;  Surgeon: [name], MD;  [facility];  Service: Endoscopy;  Laterality: N/A;   ELECTROPHYSIOLOGIC STUDY N/A 12/21/2014   Procedure: V Tach Ablation;  Surgeon: [name], MD;  Location: [place] [facility];  Service: Cardiovascular;  Laterality: N/A;   ERCP N/A 11/16/2014   Procedure: ENDOSCOPIC RETROGRADE CHOLANGIOPANCREATOGRAPHY (ERCP);  Surgeon: [name], MD;  Location: [facility];  Service: Endoscopy;  Laterality: N/A;   ESOPHAGOGASTRODUODENOSCOPY (EGD) WITH PROPOFOL N/A 12/21/2017   Procedure: ESOPHAGOGASTRODUODENOSCOPY (EGD) WITH PROPOFOL;  Surgeon: [name], MD;  Location[facility];  Service: Endoscopy;  Laterality: N/A;   EYE SURGERY     FOOT SURGERY Left 2005   "fixed bone that stuck out in my ankle area"   HEMORRHOID BANDING     IMPLANTABLE CARDIOVERTER DEFIBRILLATOR IMPLANT  09/06/09   BSX dual chamber ICD implanted in [place] for cardiac arrest 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   [place] Right ~ [place] N/A 11/25/2012   demonstrated EF 30%, inferior akinesis with mild hypokinesis of all walls, patent  LAD and RCA stents; ostial PDA with 80-90% obstruction with medical therapy recommended   MOHS SURGERY  2008   nose, skin graft   POLYPECTOMY  12/21/2017   Procedure: POLYPECTOMY;  Surgeon: [name], MD;  [facility];  Service: Endoscopy;;   RETINAL DETACHMENT SURGERY Right 2013   TENOLYSIS Right 12/21/2013   Procedure: TENOLYSIS FLEXOR CARPI RADIALIS ,DEBRIDEMENT RIGHT JOINT WRIST,DEBRIDEMENT SCAPHOTRAPEZIAL TRAPEZOID, REPAIR OF EXTENSOR HOOD;  Surgeon: Daryll Brod, MD;  Location: [facility];  Service: Orthopedics;  Laterality: Right;   TOE SURGERY Right 09/2019   3rd toe/hammer toe   V-TACH ABLATION  12/21/2014   VIDEO BRONCHOSCOPY Bilateral 01/09/2016   Procedure: VIDEO BRONCHOSCOPY WITHOUT FLUORO;  Surgeon: Juanito Doom, MD;  [facility];  Service: Cardiopulmonary;  Laterality: Bilateral;    Current Outpatient Medications  Medication Sig Dispense Refill   amiodarone (PACERONE) 200 MG tablet TAKE ONE TABLET BY MOUTH DAILY 30 tablet 6   albuterol (PROVENTIL) (2.5 MG/3ML) 0.083% nebulizer solution USE 1 VIAL VIA NEBULIZER EVERY 6 HOURS AS NEEDED FOR WHEEZING OR SHORTNESS OF BREATH 360 mL 0   apixaban (ELIQUIS) 5 MG TABS tablet Take 1 tablet (5 mg total) by mouth 2 (two) times daily. 180 tablet 1   Artificial Tear Solution (SOOTHE XP OP) Place 2 drops into both eyes daily as needed (dry eyes).     benazepril (LOTENSIN) 10 MG tablet Take 1 tablet (10 mg total) by mouth daily. 30 tablet 6   buPROPion (WELLBUTRIN) 75 MG tablet Take 1 tablet (75 mg total) by mouth daily. 90 tablet 2   busPIRone (BUSPAR) 15 MG tablet TAKE 1 TABLET BY MOUTH TWICE A DAY 180 tablet 2   carvedilol (COREG) 3.125 MG tablet Take 1 tablet (3.125 mg total) by mouth 2 (two) times daily. 60 tablet 6   cetirizine (ZYRTEC) 10 MG tablet Take 1 tablet by mouth daily as needed.     Cyanocobalamin (VITAMIN B-12) 5000 MCG SUBL Place under the tongue daily.     escitalopram (LEXAPRO) 20  MG tablet TAKE ONE TABLET BY MOUTH DAILY 90 tablet 1   fluticasone (FLONASE) 50 MCG/ACT nasal spray Place 2 sprays into both nostrils daily. 16 g 6   Fluticasone-Umeclidin-Vilant (TRELEGY ELLIPTA) 200-62.5-25 MCG/ACT AEPB Inhale 1 puff into the lungs daily.     furosemide (LASIX) 40 MG tablet TAKE 40 MG EVERY OTHER DAY AND 20 MG ON THE ALTERNATIVE DAYS 135 tablet 3   gabapentin (NEURONTIN) 300 MG capsule Take 1 capsule (300 mg total) by mouth 2 (two) times daily. 90 capsule 2   Ipratropium-Albuterol (COMBIVENT RESPIMAT) 20-100 MCG/ACT AERS respimat Inhale 1 puff into the lungs every 6 (six) hours. Shortness of breath or wheezing 4 g 5   IRON PO Take by mouth daily.      MAGNESIUM-OXIDE 400 (241.3 Mg) MG tablet TAKE 1 TABSULE BY MOUTH DAILY 90 tablet  3   mexiletine (MEXITIL) 200 MG capsule Take 1 capsule (200 mg total) by mouth 2 (two) times daily. 180 capsule 1   Multiple Vitamins-Minerals (CENTRUM ADULTS PO) Take by mouth daily.     mupirocin ointment (BACTROBAN) 2 % Apply 1 application. topically 2 (two) times daily. Apply to lower leg wounds daily prn 22 g 1   nitroGLYCERIN (NITROSTAT) 0.4 MG SL tablet Place 1 tablet (0.4 mg total) under the tongue every 5 (five) minutes as needed for chest pain. DISSOLVE 1 TABLET UNDER THE TONGUE EVERY 5 MINUTES FOR 3 DOSES 25 tablet 11   omeprazole (PRILOSEC) 20 MG capsule Take 20 mg by mouth daily.      potassium chloride SA (KLOR-CON M20) 20 MEQ tablet TAKE 40 MG EVERY OTHER DAY AND 20 MG ON THE ALTERNATIVE DAYS. 135 tablet 3   predniSONE (DELTASONE) 20 MG tablet Take 3 tabs PO daily x 5 days. 15 tablet 0   Respiratory Therapy Supplies (FLUTTER) DEVI Use as directed 1 each 0   rosuvastatin (CRESTOR) 40 MG tablet Take 1 tablet (40 mg total) by mouth daily. 90 tablet 3   Sennosides-Docusate Sodium (STOOL SOFTENER/LAXATIVE PO) Take 2 tablets by mouth daily.     Spacer/Aero-Holding Chambers (OPTICHAMBER DIAMOND) MISC optichamber VHC 1 each 0   tamsulosin  (FLOMAX) 0.4 MG CAPS capsule TAKE ONE CAPSULE BY MOUTH DAILY AFTER SUPPER 90 capsule 3   No current facility-administered medications for this visit.    Allergies:   Sulfa antibiotics   Social History: Social History   Socioeconomic History   Marital status: Married    Spouse name: Not on file   Number of children: Not on file   Years of education: Not on file   Highest education level: Not on file  Occupational History   Occupation: Retired  Tobacco Use   Smoking status: Former    Packs/day: 1.00    Years: 55.00    Pack years: 55.00    Types: Cigarettes   Smokeless tobacco: Never   Tobacco comments:    off/on, always ready to quit but does not work out  Scientific laboratory technician Use: Never used  Substance and Sexual Activity   Alcohol use: Yes    Alcohol/week: 0.0 standard drinks    Comment: occ   Drug use: No   Sexual activity: Not Currently  Other Topics Concern   Not on file  Social History Narrative   Not on file   Social Determinants of Health   Financial Resource Strain: Medium Risk   Difficulty of Paying Living Expenses: Somewhat hard  Food Insecurity: Food Insecurity Present   Worried About Running Out of Food in the Last Year: Sometimes true   Ran Out of Food in the Last Year: Never true  Transportation Needs: No Transportation Needs   Lack of Transportation (Medical): No   Lack of Transportation (Non-Medical): No  Physical Activity: Insufficiently Active   Days of Exercise per Week: 4 days   Minutes of Exercise per Session: 20 min  Stress: Stress Concern Present   Feeling of Stress : Rather much  Social Connections: Moderately Isolated   Frequency of Communication with Friends and Family: More than three times a week   Frequency of Social Gatherings with Friends and Family: Once a week   Attends Religious Services: Never   Marine scientist or Organizations: No   Attends Archivist Meetings: Never   Marital Status: Married  Arboriculturist Violence: Not  At Risk   Fear of Current or Ex-Partner: No   Emotionally Abused: No   Physically Abused: No   Sexually Abused: No    Family History: Family History  Problem Relation Age of Onset   Heart attack Brother    CAD Father    Hypertension Father    CAD Mother    Hypertension Mother    Hypertension Brother    Stroke Neg Hx     Review of Systems: All other systems reviewed and are otherwise negative except as noted above.   Physical Exam: There were no vitals filed for this visit.   GEN- The patient is well appearing, alert and oriented x 3 today.   HEENT: normocephalic, atraumatic; sclera clear, conjunctiva pink; hearing intact; oropharynx clear; neck supple, no JVP Lymph- no cervical lymphadenopathy Lungs- Clear to ausculation bilaterally, normal work of breathing.  No wheezes, rales, rhonchi Heart- Regular rate and rhythm, no murmurs, rubs or gallops, PMI not laterally displaced GI- soft, non-tender, non-distended, bowel sounds present, no hepatosplenomegaly Extremities- no clubbing or cyanosis. No edema; DP/PT/radial pulses 2+ bilaterally MS- no significant deformity or atrophy Skin- warm and dry, no rash or lesion; ICD pocket well healed Psych- euthymic mood, full affect Neuro- strength and sensation are intact  ICD interrogation- reviewed in detail today,  See PACEART report  EKG:  EKG is ordered today. Personal review of EKG ordered today shows ***  Recent Labs: 04/01/2021: Hemoglobin 12.0; Platelets 151; TSH 2.360 05/07/2021: BUN 13; Creatinine, Ser 1.14; Potassium 4.7; Sodium 140 07/14/2021: ALT 33   Wt Readings from Last 3 Encounters:  07/17/21 207 lb 6.4 oz (94.1 kg)  06/17/21 210 lb 3.2 oz (95.3 kg)  05/07/21 208 lb 6.4 oz (94.5 kg)     Other studies Reviewed: Additional studies/ records that were reviewed today include: Previous EP office notes.   Assessment and Plan:  1.  Chronic systolic dysfunction s/p [employer] dual chamber  ICD  euvolemic today Stable on an appropriate medical regimen Normal ICD function See Pace Art report No changes today  2. H/o VT Quiescent on amio and mexiletine.  Surveillance labs today  3. CAD No s/s ischemia Continue ASA, BB, and statin  4. PAF Continue eliquis for CHA2DS2VASC is at least 5   Current medicines are reviewed at length with the patient today.   =  Labs/ tests ordered today include: *** No orders of the defined types were placed in this encounter.    Disposition:   Follow up with {Blank single:19197::"Dr. Allred","Dr. Arlan Organ. Klein","Dr. Camnitz","Dr. Lambert","EP APP"} in {Blank single:19197::"2 weeks","4 weeks","3 months","6 months","12 months","as usual post gen change"}    Signed, Shirley Friar, PA-C  09/02/2021 3:06 PM  [address] [place] [phone] (office) [phone] (fax)

## 2021-09-04 ENCOUNTER — Encounter: Payer: Medicare Other | Admitting: Student

## 2021-09-04 DIAGNOSIS — I255 Ischemic cardiomyopathy: Secondary | ICD-10-CM

## 2021-09-04 DIAGNOSIS — I480 Paroxysmal atrial fibrillation: Secondary | ICD-10-CM

## 2021-09-04 DIAGNOSIS — I25810 Atherosclerosis of coronary artery bypass graft(s) without angina pectoris: Secondary | ICD-10-CM

## 2021-09-04 DIAGNOSIS — I10 Essential (primary) hypertension: Secondary | ICD-10-CM

## 2021-09-04 DIAGNOSIS — I472 Ventricular tachycardia, unspecified: Secondary | ICD-10-CM

## 2021-09-04 DIAGNOSIS — I5022 Chronic systolic (congestive) heart failure: Secondary | ICD-10-CM

## 2021-09-04 NOTE — Progress Notes (Signed)
Remote ICD transmission.   

## 2021-09-09 ENCOUNTER — Other Ambulatory Visit: Payer: Self-pay | Admitting: Orthopedic Surgery

## 2021-09-18 ENCOUNTER — Ambulatory Visit: Payer: Medicare Other | Admitting: Orthopedic Surgery

## 2021-09-18 ENCOUNTER — Encounter: Payer: Self-pay | Admitting: Orthopedic Surgery

## 2021-09-18 VITALS — BP 98/64 | HR 64 | Temp 97.2°F | Ht 72.0 in | Wt 205.0 lb

## 2021-09-18 DIAGNOSIS — I872 Venous insufficiency (chronic) (peripheral): Secondary | ICD-10-CM

## 2021-09-18 DIAGNOSIS — M542 Cervicalgia: Secondary | ICD-10-CM | POA: Diagnosis not present

## 2021-09-18 DIAGNOSIS — K4031 Unilateral inguinal hernia, with obstruction, without gangrene, recurrent: Secondary | ICD-10-CM

## 2021-09-18 DIAGNOSIS — Z638 Other specified problems related to primary support group: Secondary | ICD-10-CM

## 2021-09-18 DIAGNOSIS — G629 Polyneuropathy, unspecified: Secondary | ICD-10-CM

## 2021-09-18 DIAGNOSIS — R600 Localized edema: Secondary | ICD-10-CM

## 2021-09-18 DIAGNOSIS — E7849 Other hyperlipidemia: Secondary | ICD-10-CM

## 2021-09-18 DIAGNOSIS — J449 Chronic obstructive pulmonary disease, unspecified: Secondary | ICD-10-CM

## 2021-09-18 DIAGNOSIS — I480 Paroxysmal atrial fibrillation: Secondary | ICD-10-CM

## 2021-09-18 DIAGNOSIS — I10 Essential (primary) hypertension: Secondary | ICD-10-CM

## 2021-09-18 MED ORDER — TRELEGY ELLIPTA 200-62.5-25 MCG/ACT IN AEPB: 1.0000 | INHALATION_SPRAY | Freq: Every day | RESPIRATORY_TRACT | 0 refills | Status: DC

## 2021-09-18 MED ORDER — COMBIVENT RESPIMAT 20-100 MCG/ACT IN AERS: 1.0000 | INHALATION_SPRAY | Freq: Four times a day (QID) | RESPIRATORY_TRACT | 5 refills | Status: AC

## 2021-09-18 NOTE — Patient Instructions (Addendum)
Try gabapentin 100 mg every morning and 300 mg every night  Ok with trying Chiropractor for headaches  Ask cardiology about risk factors for hernia surgery

## 2021-09-18 NOTE — Progress Notes (Unsigned)
Careteam: Patient Care Team: Steven Alanis, NP as PCP - General (Adult Health Nurse Practitioner) Belva Crome, MD as PCP - Cardiology (Cardiology) [name], MD as PCP - Electrophysiology (Cardiology) Roel Cluck, MD as Referring Physician (Ophthalmology) Harriett Sine, MD as Consulting Physician (Dermatology) [name], MD as Consulting Physician (Gastroenterology)  Seen by: [name], AGNP-C  PLACE OF SERVICE:  [place] Directive information Does Patient Have a Medical Advance Directive?: No, Would patient like information on creating a medical advance directive?: No - Patient declined  Allergies  Allergen Reactions   Sulfa Antibiotics Hives    Chief Complaint  Patient presents with   Medical Management of Chronic Issues    Patient returns to the office for his 6 month follow up.    Quality Metric Gaps    Verified [facility] patient is due for #5 covid vaccine and shingrix.      HPI: Patient is a 81 y.o. male seen today for medical management of chronic conditions.   Hernia is slowly growing. He saw general surgery and was advised to speak with cardiology.   He has been having headaches for about 6 months. He believes it is related with neck pain. He has tried new pillow. Thinking about seeing chiropractor. Using heating pad at night.   Unable to tolerate gabapentin 300 mg bid due to increased dizziness.   Seeing podiatrist for right foot pain. Also turned outwards.     Review of Systems:  ROS***  Past Medical History:  Diagnosis Date   AICD (automatic cardioverter/defibrillator) present    Aneurysm ([place])    a. Aneurysmal infrarenal aorta up to 33 mm on CT 10/2014, recommended f/u due 10/2017   Anginal pain ([place])    Anxiety    Basal cell carcinoma of nose    S/P MOHS   Biliary acute pancreatitis    CAD (coronary artery disease)    a. s/p MI in 1994 with PCI to LAD at that time b. cath 10/2012 demonstrated EF 30%, inferior  akinesis with mild hypokinesis of all walls, patent LAD and RCA stents; ostial PDA with 80-90% obstruction with medical therapy recommended    Chronic systolic CHF (congestive heart failure) (HCC)    EF 30 to 35 % as of 09/2014.    CKD (chronic kidney disease), stage III ([place])    Complication of anesthesia 10/2014   "had to have defibrillator w/ERCP"   COPD (chronic obstructive pulmonary disease) ([place])    a. followed by pulmonary, COPD GOLD stage II   Depression    Diverticulosis of colon 07/2014   noted on CT   GERD (gastroesophageal reflux disease)    Hiatal hernia    Hyperglycemia 10/2012.   Hyperlipidemia    Hypertension    Myocardial infarction [facility]) 1994; 2011   Pneumonia 1946; 2015   Prostate enlargement 07/2014   observed on CT   Tobacco abuse    Ventricular tachycardia ([place])    a. 08/2009 s/p BSX E110 Teligen 100 AICD, ser#: [id number];  b. 08/2008 VT req ATP - detection reprogrammed from 160 to 150. c. EPS and VT ablation by Dr. Lovena Le 12/21/2014   Past Surgical History:  Procedure Laterality Date   BIOPSY  12/21/2017   Procedure: BIOPSY;  Surgeon: [name], MD;  Location: [facility];  Service: Endoscopy;;   CATARACT EXTRACTION W/ INTRAOCULAR LENS  IMPLANT, BILATERAL Bilateral ~ 2011   COLONOSCOPY     COLONOSCOPY WITH PROPOFOL N/A  12/21/2017   Procedure: COLONOSCOPY WITH PROPOFOL;  Surgeon: [name], MD;  Location: [facility];  Service: Endoscopy;  Laterality: N/A;   ELECTROPHYSIOLOGIC STUDY N/A 12/21/2014   Procedure: V Tach Ablation;  Surgeon: [name], MD;  Location: [facility];  Service: Cardiovascular;  Laterality: N/A;   ERCP N/A 11/16/2014   Procedure: ENDOSCOPIC RETROGRADE CHOLANGIOPANCREATOGRAPHY (ERCP);  Surgeon: [name], MD;  Location: [facility];  Service: Endoscopy;  Laterality: N/A;   ESOPHAGOGASTRODUODENOSCOPY (EGD) WITH PROPOFOL N/A 12/21/2017   Procedure: ESOPHAGOGASTRODUODENOSCOPY (EGD) WITH PROPOFOL;  Surgeon: [name], MD;   [facility]: [facility];  Service: Endoscopy;  Laterality: N/A;   EYE SURGERY     FOOT SURGERY Left 2005   "fixed bone that stuck out in my ankle area"   HEMORRHOID BANDING     IMPLANTABLE CARDIOVERTER DEFIBRILLATOR IMPLANT  09/06/09   BSX dual chamber ICD implanted in [place] for cardiac arrest 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   [place] Right ~ [place] N/A 11/25/2012   demonstrated EF 30%, inferior akinesis with mild hypokinesis of all walls, patent LAD and RCA stents; ostial PDA with 80-90% obstruction with medical therapy recommended   MOHS SURGERY  2008   nose, skin graft   POLYPECTOMY  12/21/2017   Procedure: POLYPECTOMY;  Surgeon: [name], MD;  [facility];  Service: Endoscopy;;   RETINAL DETACHMENT SURGERY Right 2013   TENOLYSIS Right 12/21/2013   Procedure: TENOLYSIS FLEXOR CARPI RADIALIS ,DEBRIDEMENT RIGHT JOINT WRIST,DEBRIDEMENT SCAPHOTRAPEZIAL TRAPEZOID, REPAIR OF EXTENSOR HOOD;  Surgeon: Daryll Brod, MD;  Location: [facility];  Service: Orthopedics;  Laterality: Right;   TOE SURGERY Right 09/2019   3rd toe/hammer toe   V-TACH ABLATION  12/21/2014   VIDEO BRONCHOSCOPY Bilateral 01/09/2016   Procedure: VIDEO BRONCHOSCOPY WITHOUT FLUORO;  Surgeon: Juanito Doom, MD;  [facility];  Service: Cardiopulmonary;  Laterality: Bilateral;   Social History:   reports that he has quit smoking. His smoking use included cigarettes. He has a 55.00 pack-year smoking history. He has never used smokeless tobacco. He reports current alcohol use. He reports that he does not use drugs.  Family History  Problem Relation Age of Onset   Heart attack Brother    CAD Father    Hypertension Father    CAD Mother    Hypertension Mother    Hypertension Brother    Stroke Neg Hx     Medications: Patient's Medications  New Prescriptions   No medications on file  Previous Medications   ALBUTEROL  (PROVENTIL) (2.5 MG/3ML) 0.083% NEBULIZER SOLUTION    USE 1 VIAL VIA NEBULIZER EVERY 6 HOURS AS NEEDED FOR WHEEZING OR SHORTNESS OF BREATH   AMIODARONE (PACERONE) 200 MG TABLET    TAKE ONE TABLET BY MOUTH DAILY   APIXABAN (ELIQUIS) 5 MG TABS TABLET    Take 1 tablet (5 mg total) by mouth 2 (two) times daily.   ARTIFICIAL TEAR SOLUTION (SOOTHE XP OP)    Place 2 drops into both eyes daily as needed (dry eyes).   BENAZEPRIL (LOTENSIN) 10 MG TABLET    Take 1 tablet (10 mg total) by mouth daily.   BUPROPION (WELLBUTRIN) 75 MG TABLET    TAKE ONE TABLET BY MOUTH DAILY   BUSPIRONE (BUSPAR) 15 MG TABLET    TAKE 1 TABLET BY MOUTH TWICE A DAY   CARVEDILOL (COREG) 3.125 MG TABLET    Take 1 tablet (  3.125 mg total) by mouth 2 (two) times daily.   CETIRIZINE (ZYRTEC) 10 MG TABLET    Take 1 tablet by mouth daily.   CYANOCOBALAMIN (VITAMIN B-12) 5000 MCG SUBL    Place under the tongue daily.   ESCITALOPRAM (LEXAPRO) 20 MG TABLET    TAKE ONE TABLET BY MOUTH DAILY   FLUTICASONE (FLONASE) 50 MCG/ACT NASAL SPRAY    Place 2 sprays into both nostrils daily.   FLUTICASONE-UMECLIDIN-VILANT (TRELEGY ELLIPTA) 200-62.5-25 MCG/ACT AEPB    Inhale 1 puff into the lungs daily.   FUROSEMIDE (LASIX) 40 MG TABLET    TAKE 40 MG EVERY OTHER DAY AND 20 MG ON THE ALTERNATIVE DAYS   GABAPENTIN (NEURONTIN) 300 MG CAPSULE    Take 1 capsule (300 mg total) by mouth 2 (two) times daily.   IPRATROPIUM-ALBUTEROL (COMBIVENT RESPIMAT) 20-100 MCG/ACT AERS RESPIMAT    Inhale 1 puff into the lungs every 6 (six) hours. Shortness of breath or wheezing   IRON PO    Take by mouth daily.    MAGNESIUM-OXIDE 400 (241.3 MG) MG TABLET    TAKE 1 TABSULE BY MOUTH DAILY   MEXILETINE (MEXITIL) 200 MG CAPSULE    Take 1 capsule (200 mg total) by mouth 2 (two) times daily.   MULTIPLE VITAMINS-MINERALS (CENTRUM ADULTS PO)    Take by mouth daily.   MUPIROCIN OINTMENT (BACTROBAN) 2 %    Apply 1 application. topically 2 (two) times daily. Apply to lower leg wounds  daily prn   NITROGLYCERIN (NITROSTAT) 0.4 MG SL TABLET    Place 1 tablet (0.4 mg total) under the tongue every 5 (five) minutes as needed for chest pain. DISSOLVE 1 TABLET UNDER THE TONGUE EVERY 5 MINUTES FOR 3 DOSES   OMEPRAZOLE (PRILOSEC) 20 MG CAPSULE    Take 20 mg by mouth daily.    POTASSIUM CHLORIDE SA (KLOR-CON M20) 20 MEQ TABLET    TAKE 40 MG EVERY OTHER DAY AND 20 MG ON THE ALTERNATIVE DAYS.   RESPIRATORY THERAPY SUPPLIES (FLUTTER) DEVI    Use as directed   ROSUVASTATIN (CRESTOR) 40 MG TABLET    Take 1 tablet (40 mg total) by mouth daily.   SENNOSIDES-DOCUSATE SODIUM (STOOL SOFTENER/LAXATIVE PO)    Take 2 tablets by mouth daily.   SPACER/AERO-HOLDING CHAMBERS (OPTICHAMBER DIAMOND) MISC    optichamber VHC   TAMSULOSIN (FLOMAX) 0.4 MG CAPS CAPSULE    TAKE ONE CAPSULE BY MOUTH DAILY AFTER SUPPER  Modified Medications   No medications on file  Discontinued Medications   PREDNISONE (DELTASONE) 20 MG TABLET    Take 3 tabs PO daily x 5 days.    Physical Exam:  Vitals:   09/18/21 1518  BP: 98/64  Pulse: 64  Temp: (!) 97.2 F (36.2 C)  SpO2: 99%  Weight: 205 lb (93 kg)  Height: 6' (1.829 m)   Body mass index is 27.8 kg/m. Wt Readings from Last 3 Encounters:  09/18/21 205 lb (93 kg)  07/17/21 207 lb 6.4 oz (94.1 kg)  06/17/21 210 lb 3.2 oz (95.3 kg)    Physical Exam***  Labs reviewed: Basic Metabolic Panel: Recent Labs    04/01/21 1254 04/16/21 1407 05/07/21 1449  NA 139 144 140  K 4.6 4.5 4.7  CL 102 105 102  CO2 '26 25 23  '$ GLUCOSE 95 91 91  BUN '12 11 13  '$ CREATININE 1.01 1.00 1.14  CALCIUM 9.5 9.7 9.5  TSH 2.360  --   --    Liver Function Tests: Recent  Labs    04/01/21 1254 06/04/21 1036 07/14/21 1026  AST 62* 25 33  ALT 60* 29 33  ALKPHOS 52 54 54  BILITOT 0.6 0.7 0.7  PROT 6.5 6.5 6.1  ALBUMIN 4.2 4.3 4.1   No results for input(s): "LIPASE", "AMYLASE" in the last 8760 hours. No results for input(s): "AMMONIA" in the last 8760 hours. CBC: Recent  Labs    11/14/20 1502 12/20/20 1410 04/01/21 1254  WBC 4.5 3.3* 4.1  NEUTROABS 2,907 2,056  --   HGB 12.9* 12.0* 12.0*  HCT 38.1* 36.9* 35.4*  MCV 100.5* 102.2* 100*  PLT 119* 158 151   Lipid Panel: Recent Labs    04/01/21 1254 06/04/21 1036 07/14/21 1026  CHOL 195 162 117  HDL 52 58 47  LDLCALC 129* 88 53  TRIG 78 83 88  CHOLHDL 3.8 2.8 2.5   TSH: Recent Labs    04/01/21 1254  TSH 2.360   A1C: Lab Results  Component Value Date   HGBA1C 5.6 12/29/2016     Assessment/Plan There are no diagnoses linked to this encounter.  Next appt: *** Alysiah Suppa [place], [facility] [phone]

## 2021-09-19 ENCOUNTER — Other Ambulatory Visit: Payer: Self-pay | Admitting: Orthopedic Surgery

## 2021-09-19 ENCOUNTER — Other Ambulatory Visit: Payer: Self-pay | Admitting: *Deleted

## 2021-09-19 DIAGNOSIS — D539 Nutritional anemia, unspecified: Secondary | ICD-10-CM

## 2021-09-19 DIAGNOSIS — I5022 Chronic systolic (congestive) heart failure: Secondary | ICD-10-CM

## 2021-09-19 DIAGNOSIS — I25810 Atherosclerosis of coronary artery bypass graft(s) without angina pectoris: Secondary | ICD-10-CM

## 2021-09-19 MED ORDER — CARVEDILOL 3.125 MG PO TABS: 3.1250 mg | ORAL_TABLET | Freq: Two times a day (BID) | ORAL | 8 refills | Status: DC

## 2021-09-23 LAB — CBC WITH DIFFERENTIAL/PLATELET
Absolute Monocytes: 552 cells/uL (ref 200–950)
Basophils Absolute: 41 cells/uL (ref 0–200)
Basophils Relative: 0.9 %
Eosinophils Absolute: 101 cells/uL (ref 15–500)
Eosinophils Relative: 2.2 %
HCT: 39.1 % (ref 38.5–50.0)
Hemoglobin: 13.3 g/dL (ref 13.2–17.1)
Lymphs Abs: 952 cells/uL (ref 850–3900)
MCH: 34.6 pg — ABNORMAL HIGH (ref 27.0–33.0)
MCHC: 34 g/dL (ref 32.0–36.0)
MCV: 101.8 fL — ABNORMAL HIGH (ref 80.0–100.0)
MPV: 11.3 fL (ref 7.5–12.5)
Monocytes Relative: 12 %
Neutro Abs: 2953 cells/uL (ref 1500–7800)
Neutrophils Relative %: 64.2 %
Platelets: 132 10*3/uL — ABNORMAL LOW (ref 140–400)
RBC: 3.84 10*6/uL — ABNORMAL LOW (ref 4.20–5.80)
RDW: 15 % (ref 11.0–15.0)
Total Lymphocyte: 20.7 %
WBC: 4.6 10*3/uL (ref 3.8–10.8)

## 2021-09-23 LAB — FOLATE: Folate: >24 ng/mL

## 2021-09-23 LAB — COMPREHENSIVE METABOLIC PANEL
AG Ratio: 1.8 (calc) (ref 1.0–2.5)
ALT: 27 U/L (ref 9–46)
AST: 26 U/L (ref 10–35)
Albumin: 4.2 g/dL (ref 3.6–5.1)
Alkaline phosphatase (APISO): 50 U/L (ref 35–144)
BUN: 11 mg/dL (ref 7–25)
CO2: 27 mmol/L (ref 20–32)
Calcium: 9.5 mg/dL (ref 8.6–10.3)
Chloride: 103 mmol/L (ref 98–110)
Creat: 0.91 mg/dL (ref 0.70–1.22)
Globulin: 2.4 g/dL (calc) (ref 1.9–3.7)
Glucose, Bld: 75 mg/dL (ref 65–139)
Potassium: 4.4 mmol/L (ref 3.5–5.3)
Sodium: 139 mmol/L (ref 135–146)
Total Bilirubin: 0.8 mg/dL (ref 0.2–1.2)
Total Protein: 6.6 g/dL (ref 6.1–8.1)

## 2021-09-23 LAB — IRON,TIBC AND FERRITIN PANEL
%SAT: 28 % (calc) (ref 20–48)
Ferritin: 81 ng/mL (ref 24–380)
Iron: 79 ug/dL (ref 50–180)
TIBC: 287 mcg/dL (calc) (ref 250–425)

## 2021-09-23 LAB — TEST AUTHORIZATION 2

## 2021-09-23 LAB — VITAMIN B12: Vitamin B-12: 396 pg/mL (ref 200–1100)

## 2021-09-23 NOTE — Progress Notes (Signed)
Cardiology Office Note:    Date:  09/25/2021   ID:  Martinez, Boxx [birth year], MRN [id number]  PCP:  Yvonna Alanis, NP  Cardiologist:  Sinclair Grooms, MD   Referring MD: Yvonna Alanis, NP   Chief Complaint  Patient presents with   Coronary Artery Disease   Congestive Heart Failure   Follow-up    History of Present Illness:    Steven Guzman is a 81 y.o. male with a hx of ischemic cardiomyopathy with EF 40-45% on echo 2018, COPD, hypertension, recent low blood pressures, CAD with prior coronary bypass grafting, tobacco abuse, PAF, ventricular tachycardia controlled on amiodarone and Mexitil, and implantable cardiac defibrillator. AAA infrarenal 3.4 cm 2019.  Fell at home yesterday.  He was on this deck.  He did not hit his head.  He denies angina.  He denies orthopnea or PND.  There is no peripheral edema.  Right lower extremity is swollen more than left.  No palpitations AICD discharge.  Past Medical History:  Diagnosis Date   AICD (automatic cardioverter/defibrillator) present    Aneurysm ([place])    a. Aneurysmal infrarenal aorta up to 33 mm on CT 10/2014, recommended f/u due 10/2017   Anginal pain ([place])    Anxiety    Basal cell carcinoma of nose    S/P MOHS   Biliary acute pancreatitis    CAD (coronary artery disease)    a. s/p MI in 1994 with PCI to LAD at that time b. cath 10/2012 demonstrated EF 30%, inferior akinesis with mild hypokinesis of all walls, patent LAD and RCA stents; ostial PDA with 80-90% obstruction with medical therapy recommended    Chronic systolic CHF (congestive heart failure) (HCC)    EF 30 to 35 % as of 09/2014.    CKD (chronic kidney disease), stage III ([place])    Complication of anesthesia 10/2014   "had to have defibrillator w/ERCP"   COPD (chronic obstructive pulmonary disease) ([place])    a. followed by pulmonary, COPD GOLD stage II   Depression    Diverticulosis of colon 07/2014   noted on CT   GERD (gastroesophageal reflux disease)     Hiatal hernia    Hyperglycemia 10/2012.   Hyperlipidemia    Hypertension    Myocardial infarction [facility]) 1994; 2011   Pneumonia 1946; 2015   Prostate enlargement 07/2014   observed on CT   Tobacco abuse    Ventricular tachycardia ([place])    a. 08/2009 s/p BSX E110 Teligen 100 AICD, ser#: [id number];  b. 08/2008 VT req ATP - detection reprogrammed from 160 to 150. c. EPS and VT ablation by Dr. Lovena Le 12/21/2014    Past Surgical History:  Procedure Laterality Date   BIOPSY  12/21/2017   Procedure: BIOPSY;  Surgeon: [name], MD;  [facility]: [facility];  Service: Endoscopy;;   CATARACT EXTRACTION W/ INTRAOCULAR LENS  IMPLANT, BILATERAL Bilateral ~ 2011   COLONOSCOPY     COLONOSCOPY WITH PROPOFOL N/A 12/21/2017   Procedure: COLONOSCOPY WITH PROPOFOL;  Surgeon: [name], MD;  [facility]: [facility];  Service: Endoscopy;  Laterality: N/A;   ELECTROPHYSIOLOGIC STUDY N/A 12/21/2014   Procedure: V Tach Ablation;  Surgeon: [name], MD;  Location: [place] [facility];  Service: Cardiovascular;  Laterality: N/A;   ERCP N/A 11/16/2014   Procedure: ENDOSCOPIC RETROGRADE CHOLANGIOPANCREATOGRAPHY (ERCP);  Surgeon: [name], MD;  Location: [place];  Service: Endoscopy;  Laterality: N/A;   ESOPHAGOGASTRODUODENOSCOPY (EGD) WITH PROPOFOL  N/A 12/21/2017   Procedure: ESOPHAGOGASTRODUODENOSCOPY (EGD) WITH PROPOFOL;  Surgeon: [name], MD;  [facility];  Service: Endoscopy;  Laterality: N/A;   EYE SURGERY     FOOT SURGERY Left 2005   "fixed bone that stuck out in my ankle area"   HEMORRHOID BANDING     IMPLANTABLE CARDIOVERTER DEFIBRILLATOR IMPLANT  09/06/09   BSX dual chamber ICD implanted in [place] for cardiac arrest 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   [place] Right ~ [place] N/A 11/25/2012   demonstrated EF 30%, inferior akinesis with mild hypokinesis of all walls, patent LAD and RCA stents; ostial PDA with 80-90%  obstruction with medical therapy recommended   MOHS SURGERY  2008   nose, skin graft   POLYPECTOMY  12/21/2017   Procedure: POLYPECTOMY;  Surgeon: [name], MD;  [facility];  Service: Endoscopy;;   RETINAL DETACHMENT SURGERY Right 2013   TENOLYSIS Right 12/21/2013   Procedure: TENOLYSIS FLEXOR CARPI RADIALIS ,DEBRIDEMENT RIGHT JOINT WRIST,DEBRIDEMENT SCAPHOTRAPEZIAL TRAPEZOID, REPAIR OF EXTENSOR HOOD;  Surgeon: Daryll Brod, MD;  Location: [place];  Service: Orthopedics;  Laterality: Right;   TOE SURGERY Right 09/2019   3rd toe/hammer toe   V-TACH ABLATION  12/21/2014   VIDEO BRONCHOSCOPY Bilateral 01/09/2016   Procedure: VIDEO BRONCHOSCOPY WITHOUT FLUORO;  Surgeon: Juanito Doom, MD;  [facility];  Service: Cardiopulmonary;  Laterality: Bilateral;    Current Medications: Current Meds  Medication Sig   albuterol (PROVENTIL) (2.5 MG/3ML) 0.083% nebulizer solution USE 1 VIAL VIA NEBULIZER EVERY 6 HOURS AS NEEDED FOR WHEEZING OR SHORTNESS OF BREATH   amiodarone (PACERONE) 200 MG tablet TAKE ONE TABLET BY MOUTH DAILY   apixaban (ELIQUIS) 5 MG TABS tablet Take 1 tablet (5 mg total) by mouth 2 (two) times daily.   Artificial Tear Solution (SOOTHE XP OP) Place 2 drops into both eyes daily as needed (dry eyes).   benazepril (LOTENSIN) 10 MG tablet Take 0.5 mg by mouth daily.   buPROPion (WELLBUTRIN) 75 MG tablet TAKE ONE TABLET BY MOUTH DAILY   busPIRone (BUSPAR) 15 MG tablet TAKE 1 TABLET BY MOUTH TWICE A DAY   carvedilol (COREG) 3.125 MG tablet Take 1 tablet (3.125 mg total) by mouth 2 (two) times daily.   cetirizine (ZYRTEC) 10 MG tablet Take 1 tablet by mouth daily.   Cyanocobalamin (VITAMIN B-12) 5000 MCG SUBL Place under the tongue daily.   escitalopram (LEXAPRO) 20 MG tablet TAKE ONE TABLET BY MOUTH DAILY   fluticasone (FLONASE) 50 MCG/ACT nasal spray Place 2 sprays into both nostrils daily.   Fluticasone-Umeclidin-Vilant (TRELEGY ELLIPTA)  200-62.5-25 MCG/ACT AEPB Inhale 1 puff into the lungs daily.   furosemide (LASIX) 40 MG tablet TAKE 40 MG EVERY OTHER DAY AND 20 MG ON THE ALTERNATIVE DAYS   gabapentin (NEURONTIN) 300 MG capsule Take 400 mg by mouth 2 (two) times daily. Pt take 1 tablet 300 mg at night and 100 mg in the morning.   guaiFENesin (MUCINEX) 600 MG 12 hr tablet Take 1 tablet by mouth 2 (two) times daily.   Ipratropium-Albuterol (COMBIVENT RESPIMAT) 20-100 MCG/ACT AERS respimat Inhale 1 puff into the lungs every 6 (six) hours. Shortness of breath or wheezing   IRON PO Take by mouth daily.    MAGNESIUM-OXIDE 400 (241.3 Mg) MG tablet TAKE 1 TABSULE BY MOUTH DAILY   mexiletine (MEXITIL) 200 MG capsule Take 1 capsule (200 mg total) by mouth 2 (two) times  daily.   Multiple Vitamins-Minerals (CENTRUM ADULTS PO) Take by mouth daily.   mupirocin ointment (BACTROBAN) 2 % Apply 1 application. topically 2 (two) times daily. Apply to lower leg wounds daily prn   nitroGLYCERIN (NITROSTAT) 0.4 MG SL tablet Place 1 tablet (0.4 mg total) under the tongue every 5 (five) minutes as needed for chest pain. DISSOLVE 1 TABLET UNDER THE TONGUE EVERY 5 MINUTES FOR 3 DOSES   omeprazole (PRILOSEC) 20 MG capsule Take 20 mg by mouth daily.    potassium chloride SA (KLOR-CON M20) 20 MEQ tablet TAKE 40 MG EVERY OTHER DAY AND 20 MG ON THE ALTERNATIVE DAYS.   Respiratory Therapy Supplies (FLUTTER) DEVI Use as directed   rosuvastatin (CRESTOR) 40 MG tablet Take 1 tablet (40 mg total) by mouth daily.   Sennosides-Docusate Sodium (STOOL SOFTENER/LAXATIVE PO) Take 2 tablets by mouth daily.   Spacer/Aero-Holding Chambers (OPTICHAMBER DIAMOND) MISC optichamber VHC   STIOLTO RESPIMAT 2.5-2.5 MCG/ACT AERS Inhale 2 puffs into the lungs daily.   tamsulosin (FLOMAX) 0.4 MG CAPS capsule TAKE ONE CAPSULE BY MOUTH DAILY AFTER SUPPER   [DISCONTINUED] benazepril (LOTENSIN) 10 MG tablet Take 1 tablet (10 mg total) by mouth daily.     Allergies:   Sulfa antibiotics    Social History   Socioeconomic History   Marital status: Married    Spouse name: Not on file   Number of children: Not on file   Years of education: Not on file   Highest education level: Not on file  Occupational History   Occupation: Retired  Tobacco Use   Smoking status: Former    Packs/day: 1.00    Years: 55.00    Total pack years: 55.00    Types: Cigarettes   Smokeless tobacco: Never   Tobacco comments:    off/on, always ready to quit but does not work out  Scientific laboratory technician Use: Never used  Substance and Sexual Activity   Alcohol use: Yes    Alcohol/week: 0.0 standard drinks of alcohol    Comment: occ   Drug use: No   Sexual activity: Not Currently  Other Topics Concern   Not on file  Social History Narrative   Not on file   Social Determinants of Health   Financial Resource Strain: Medium Risk (03/06/2021)   Overall Financial Resource Strain (CARDIA)    Difficulty of Paying Living Expenses: Somewhat hard  Food Insecurity: Food Insecurity Present (03/06/2021)   Hunger Vital Sign    Worried About Running Out of Food in the Last Year: Sometimes true    Ran Out of Food in the Last Year: Never true  Transportation Needs: No Transportation Needs (03/06/2021)   PRAPARE - Hydrologist (Medical): No    Lack of Transportation (Non-Medical): No  Physical Activity: Insufficiently Active (03/06/2021)   Exercise Vital Sign    Days of Exercise per Week: 4 days    Minutes of Exercise per Session: 20 min  Stress: Stress Concern Present (03/06/2021)   [place]    Feeling of Stress : Rather much  Social Connections: Moderately Isolated (03/06/2021)   Social Connection and Isolation Panel [NHANES]    Frequency of Communication with Friends and Family: More than three times a week    Frequency of Social Gatherings with Friends and Family: Once a week    Attends Religious Services:  Never    Marine scientist or Organizations: No  Attends Archivist Meetings: Never    Marital Status: Married     Family History: The patient's family history includes CAD in his father and mother; Heart attack in his brother; Hypertension in his brother, father, and mother. There is no history of Stroke.  ROS:   Please see the history of present illness.    Imbalance and increasingly frequent falls.  Recurring occipital headaches.  All other systems reviewed and are negative.  EKGs/Labs/Other Studies Reviewed:    The following studies were reviewed today: January 2023 echocardiogram IMPRESSIONS     1. Left ventricular ejection fraction, by estimation, is 40 to 45%. The  left ventricle has mildly decreased function. The left ventricle  demonstrates regional wall motion abnormalities witih basal to mid  inferior and inferolateral akinesis,  anterolateral hypokinesis. The left ventricular internal cavity size was  mildly dilated. There is mild left ventricular hypertrophy. Left  ventricular diastolic parameters are consistent with Grade I diastolic  dysfunction (impaired relaxation).   2. Right ventricular systolic function is normal. The right ventricular  size is normal. There is normal pulmonary artery systolic pressure. The  estimated right ventricular systolic pressure is 50.0 mmHg.   3. Left atrial size was moderately dilated.   4. Right atrial size was mildly dilated.   5. The mitral valve is normal in structure. Trivial mitral valve  regurgitation. No evidence of mitral stenosis.   6. The aortic valve is tricuspid. Aortic valve regurgitation is trivial.  Aortic valve sclerosis/calcification is present, without any evidence of  aortic stenosis.   7. Aortic dilatation noted. There is moderate dilatation of the ascending  aorta, measuring 44 mm.   8. The inferior vena cava is normal in size with greater than 50%  respiratory variability, suggesting right  atrial pressure of 3 mmHg.   CT angiogram of the aorta February 2023. IMPRESSION: 1. Mild fusiform aneurysmal dilation of the ascending thoracic aorta with a maximal diameter of 4.3 cm. Recommend annual imaging followup by CTA or MRA. This recommendation follows 2010 ACCF/AHA/AATS/ACR/ASA/SCA/SCAI/SIR/STS/SVM Guidelines for the Diagnosis and Management of Patients with Thoracic Aortic Disease. Circulation. 2010; 121: E266-e369. 2. Mild aneurysmal dilation of the proximal descending thoracic aorta with a maximal diameter of 4.0 cm. 3. Mild focal fusiform aneurysmal dilation of the infrarenal abdominal aorta with a maximal diameter of 3.4 cm remains unchanged dating back to September of 2019. Recommend follow-up every 3 years. This recommendation follows ACR consensus guidelines: White Paper of the ACR Incidental Findings Committee II on Vascular Findings. J Am Coll Radiol 2013; 10:789-794. 4. Scattered aortic and coronary artery calcified atherosclerotic plaque. 5. Left subclavian approach cardiac rhythm maintenance device with leads terminating in the right atrial appendage and right ventricular apex. 6. Combined centrilobular and paraseptal pulmonary emphysema. 7. Pneumobilia likely indicating prior ERCP and sphincterotomy with a persistently patent biliary sphincter. 8. Additional ancillary findings as above.  EKG:  EKG normal sinus rhythm, interventricular conduction delay, left atrial abnormality.  Compared to April 2022, sinus rhythm has replaced ectopic atrial rhythm.  Recent Labs: 04/01/2021: TSH 2.360 09/18/2021: ALT 27; BUN 11; Creat 0.91; Hemoglobin 13.3; Platelets 132; Potassium 4.4; Sodium 139  Recent Lipid Panel    Component Value Date/Time   CHOL 117 07/14/2021 1026   TRIG 88 07/14/2021 1026   HDL 47 07/14/2021 1026   CHOLHDL 2.5 07/14/2021 1026   CHOLHDL 2.5 03/01/2020 1344   VLDL 16.6 12/29/2016 1618   LDLCALC 53 07/14/2021 1026   LDLCALC 67 03/01/2020 1344  Physical Exam:    VS:  BP 94/60   Pulse 71   Ht 6' (1.829 m)   Wt 200 lb 9.6 oz (91 kg)   SpO2 94%   BMI 27.21 kg/m     Wt Readings from Last 3 Encounters:  09/25/21 200 lb 9.6 oz (91 kg)  09/18/21 205 lb (93 kg)  07/17/21 207 lb 6.4 oz (94.1 kg)     GEN: Skin has ecchymoses and evidence of lichenification. No acute distress HEENT: Normal NECK: No JVD. LYMPHATICS: No lymphadenopathy CARDIAC: No murmur. RRR no gallop, with right greater than left lower extremity edema and skin lichenification. VASCULAR:  Normal Pulses. No bruits. RESPIRATORY:  Clear to auscultation without rales, wheezing or rhonchi  ABDOMEN: Soft, non-tender, non-distended, No pulsatile mass, MUSCULOSKELETAL: No deformity  SKIN: Warm and dry NEUROLOGIC:  Alert and oriented x 3 PSYCHIATRIC:  Normal affect   ASSESSMENT:    1. Chronic systolic congestive heart failure ([place])   2. Coronary artery disease involving coronary bypass graft of native heart without angina pectoris   3. VT (ventricular tachycardia) (HCC)   4. Paroxysmal atrial fibrillation ([place])   5. Essential hypertension   6. Cardiac defibrillator in situ   7. COPD mixed type ([place])   8. Hyperlipidemia, unspecified hyperlipidemia type   9. Abdominal aortic aneurysm (AAA) without rupture, unspecified part ([place])    PLAN:    In order of problems listed above:  Most recent LVEF is noted above.  He is in the mid range reduced function.  Continue carvedilol Lotensin.  We are limited due to low blood pressure. Continue statin therapy.  We have chosen against aspirin because of bleeding risk. Continue to follow in the device clinic Continue Eliquis and Pacerone low-dose.  This is followed by EP. Blood pressure is chronically low.  Hands no additional heart failure therapy can be used.  He is on the aggressive side relative to furosemide getting a total of 60 mg/day. Pacemaker device clinic No longer smoking Continue Crestor Needs follow-up.   No actionable data based on most recent CT of the aorta in February. Tentatively cleared from cardiovascular standpoint for herniorrhaphy.  He will be at high risk for unexpected complications due to the complicated medical problems that he has although he is not a prohibitive risk.   Medication Adjustments/Labs and Tests Ordered: Current medicines are reviewed at length with the patient today.  Concerns regarding medicines are outlined above.  Orders Placed This Encounter  Procedures   EKG 12-Lead   No orders of the defined types were placed in this encounter.   Patient Instructions  Medication Instructions:  Your physician recommends that you continue on your current medications as directed. Please refer to the Current Medication list given to you today.  *If you need a refill on your cardiac medications before your next appointment, please call your pharmacy*  Lab Work: NONE  Testing/Procedures: NONE  Follow-Up: At [facility], you and your health needs are our priority.  As part of our continuing mission to provide you with exceptional heart care, we have created designated Provider Care Teams.  These Care Teams include your primary Cardiologist (physician) and Advanced Practice Providers (APPs -  Physician Assistants and Nurse Practitioners) who all work together to provide you with the care you need, when you need it.  Your next appointment:   1 year(s)  The format for your next appointment:   In Person  Provider:   Sinclair Grooms, MD {  Important Information About Sugar         Signed, Sinclair Grooms, MD  09/25/2021 3:59 PM    [facility]

## 2021-09-25 ENCOUNTER — Encounter: Payer: Self-pay | Admitting: Interventional Cardiology

## 2021-09-25 ENCOUNTER — Ambulatory Visit: Payer: Medicare Other | Admitting: Interventional Cardiology

## 2021-09-25 VITALS — BP 94/60 | HR 71 | Ht 72.0 in | Wt 200.6 lb

## 2021-09-25 DIAGNOSIS — I5022 Chronic systolic (congestive) heart failure: Secondary | ICD-10-CM | POA: Diagnosis not present

## 2021-09-25 DIAGNOSIS — Z01818 Encounter for other preprocedural examination: Secondary | ICD-10-CM

## 2021-09-25 DIAGNOSIS — I714 Abdominal aortic aneurysm, without rupture, unspecified: Secondary | ICD-10-CM

## 2021-09-25 DIAGNOSIS — I25810 Atherosclerosis of coronary artery bypass graft(s) without angina pectoris: Secondary | ICD-10-CM | POA: Diagnosis not present

## 2021-09-25 DIAGNOSIS — I10 Essential (primary) hypertension: Secondary | ICD-10-CM

## 2021-09-25 DIAGNOSIS — I480 Paroxysmal atrial fibrillation: Secondary | ICD-10-CM

## 2021-09-25 DIAGNOSIS — I472 Ventricular tachycardia, unspecified: Secondary | ICD-10-CM

## 2021-09-25 DIAGNOSIS — Z95810 Presence of automatic (implantable) cardiac defibrillator: Secondary | ICD-10-CM

## 2021-09-25 DIAGNOSIS — J449 Chronic obstructive pulmonary disease, unspecified: Secondary | ICD-10-CM

## 2021-09-25 DIAGNOSIS — E785 Hyperlipidemia, unspecified: Secondary | ICD-10-CM

## 2021-09-25 NOTE — Patient Instructions (Signed)
Medication Instructions:  Your physician recommends that you continue on your current medications as directed. Please refer to the Current Medication list given to you today.  *If you need a refill on your cardiac medications before your next appointment, please call your pharmacy*  Lab Work: NONE  Testing/Procedures: NONE  Follow-Up: At [facility], you and your health needs are our priority.  As part of our continuing mission to provide you with exceptional heart care, we have created designated Provider Care Teams.  These Care Teams include your primary Cardiologist (physician) and Advanced Practice Providers (APPs -  Physician Assistants and Nurse Practitioners) who all work together to provide you with the care you need, when you need it.  Your next appointment:   1 year(s)  The format for your next appointment:   In Person  Provider:   Henry W Smith III, MD {   Important Information About Sugar       

## 2021-10-10 ENCOUNTER — Other Ambulatory Visit: Payer: Self-pay

## 2021-10-10 ENCOUNTER — Encounter: Payer: Self-pay | Admitting: Orthopedic Surgery

## 2021-10-10 MED ORDER — APIXABAN 5 MG PO TABS: 5.0000 mg | ORAL_TABLET | Freq: Two times a day (BID) | ORAL | 1 refills | Status: DC

## 2021-10-10 MED ORDER — MEXILETINE HCL 200 MG PO CAPS: 200.0000 mg | ORAL_CAPSULE | Freq: Two times a day (BID) | ORAL | 3 refills | Status: DC

## 2021-10-10 NOTE — Telephone Encounter (Signed)
Prescription refill request for Eliquis received.  Indication: afib  Last office visit: Tamala Julian 09/25/2021 Scr: 0.91, 09/18/2021 Age: 81 yo  Weight: 91 kg   Refill sent.

## 2021-10-13 MED ORDER — BUSPIRONE HCL 15 MG PO TABS: 15.0000 mg | ORAL_TABLET | Freq: Two times a day (BID) | ORAL | 2 refills | Status: DC

## 2021-10-15 ENCOUNTER — Encounter: Payer: Self-pay | Admitting: Interventional Cardiology

## 2021-11-13 ENCOUNTER — Ambulatory Visit: Payer: Medicare Other | Admitting: Orthopedic Surgery

## 2021-11-13 ENCOUNTER — Encounter: Payer: Self-pay | Admitting: Orthopedic Surgery

## 2021-11-13 VITALS — BP 104/80 | HR 75 | Temp 97.1°F | Resp 20 | Ht 72.0 in | Wt 199.4 lb

## 2021-11-13 DIAGNOSIS — R2681 Unsteadiness on feet: Secondary | ICD-10-CM

## 2021-11-13 DIAGNOSIS — S01112D Laceration without foreign body of left eyelid and periocular area, subsequent encounter: Secondary | ICD-10-CM | POA: Diagnosis not present

## 2021-11-13 DIAGNOSIS — R031 Nonspecific low blood-pressure reading: Secondary | ICD-10-CM

## 2021-11-13 DIAGNOSIS — M542 Cervicalgia: Secondary | ICD-10-CM

## 2021-11-13 DIAGNOSIS — I480 Paroxysmal atrial fibrillation: Secondary | ICD-10-CM

## 2021-11-13 DIAGNOSIS — R296 Repeated falls: Secondary | ICD-10-CM | POA: Diagnosis not present

## 2021-11-13 DIAGNOSIS — J449 Chronic obstructive pulmonary disease, unspecified: Secondary | ICD-10-CM

## 2021-11-13 DIAGNOSIS — K4031 Unilateral inguinal hernia, with obstruction, without gangrene, recurrent: Secondary | ICD-10-CM

## 2021-11-13 DIAGNOSIS — R600 Localized edema: Secondary | ICD-10-CM

## 2021-11-13 MED ORDER — FUROSEMIDE 40 MG PO TABS: 20.0000 mg | ORAL_TABLET | Freq: Every day | ORAL | 3 refills | Status: DC

## 2021-11-13 NOTE — Progress Notes (Signed)
Careteam: Patient Care Team: Yvonna Alanis, NP as PCP - General (Adult Health Nurse Practitioner) Belva Crome, MD as PCP - Cardiology (Cardiology) [name], MD as PCP - Electrophysiology (Cardiology) Roel Cluck, MD as Referring Physician (Ophthalmology) Harriett Sine, MD as Consulting Physician (Dermatology) [name], MD as Consulting Physician (Gastroenterology)  Seen by: [name], AGNP-C  PLACE OF SERVICE:  [place] Directive information    Allergies  Allergen Reactions   Sulfa Antibiotics Hives    No chief complaint on file.    HPI: Patient is a 81 y.o. male seen today s/p ED visit due to fall 0/02/2022.   H/o ischemic cardiomyopathy, EF 40-45%, HTN, COPD and PAF. 11/08/2021 he fell at home hitting his head. He had a laceration to to left eyebrow, skin tear to right knee and right forearm. He presented to the ED since he takes Eliquis for PAF. CT head no acute hemorrhage, mild periventricular white matter changes seen. WBC 3.8, hgb 11.7, MCV 105, platelets 148, BUN/creat 12/1.27, Na+ 138, K+ 4.1. 3 Steri strips applied to left eyebrow laceration.   Falls frequently. Continues to have balance issues. Ambulating with cane. He would like referral to neurology. Outpatient PT also discussed.   Neck pain ongoing, no radiation, taking gabapentin.   BLE improved. Wearing compression stockings. No edema today. Would like to try furosemide 20 mg daily.    Review of Systems:  Review of Systems  Constitutional:  Negative for chills, fever, malaise/fatigue and weight loss.  HENT:  Negative for congestion and sore throat.   Eyes:  Negative for blurred vision and double vision.  Respiratory:  Positive for cough, sputum production, shortness of breath and wheezing.   Cardiovascular:  Positive for leg swelling. Negative for chest pain.  Gastrointestinal:  Positive for heartburn. Negative for abdominal pain, blood in stool, constipation,  diarrhea, nausea and vomiting.       Inguinal hernia  Genitourinary:  Positive for frequency. Negative for dysuria.  Musculoskeletal:  Positive for falls and neck pain. Negative for joint pain.  Skin:        Left eyebrow laceration  Neurological:  Positive for weakness and headaches. Negative for dizziness.  Psychiatric/Behavioral:  Positive for depression. Negative for memory loss. The patient is nervous/anxious. The patient does not have insomnia.     Past Medical History:  Diagnosis Date   AICD (automatic cardioverter/defibrillator) present    Aneurysm ([place])    a. Aneurysmal infrarenal aorta up to 33 mm on CT 10/2014, recommended f/u due 10/2017   Anginal pain ([place])    Anxiety    Basal cell carcinoma of nose    S/P MOHS   Biliary acute pancreatitis    CAD (coronary artery disease)    a. s/p MI in 1994 with PCI to LAD at that time b. cath 10/2012 demonstrated EF 30%, inferior akinesis with mild hypokinesis of all walls, patent LAD and RCA stents; ostial PDA with 80-90% obstruction with medical therapy recommended    Chronic systolic CHF (congestive heart failure) (HCC)    EF 30 to 35 % as of 09/2014.    CKD (chronic kidney disease), stage III ([place])    Complication of anesthesia 10/2014   "had to have defibrillator w/ERCP"   COPD (chronic obstructive pulmonary disease) ([place])    a. followed by pulmonary, COPD GOLD stage II   Depression    Diverticulosis of colon 07/2014   noted on CT   GERD (gastroesophageal  reflux disease)    Hiatal hernia    Hyperglycemia 10/2012.   Hyperlipidemia    Hypertension    Myocardial infarction [facility]) 1994; 2011   Pneumonia 1946; 2015   Prostate enlargement 07/2014   observed on CT   Tobacco abuse    Ventricular tachycardia ([place])    a. 08/2009 s/p BSX E110 Teligen 100 AICD, ser#: [id number];  b. 08/2008 VT req ATP - detection reprogrammed from 160 to 150. c. EPS and VT ablation by Dr. Lovena Le 12/21/2014   Past Surgical History:  Procedure Laterality Date    BIOPSY  12/21/2017   Procedure: BIOPSY;  Surgeon: [name], MD;  Location: [facility];  Service: Endoscopy;;   CATARACT EXTRACTION W/ INTRAOCULAR LENS  IMPLANT, BILATERAL Bilateral ~ 2011   COLONOSCOPY     COLONOSCOPY WITH PROPOFOL N/A 12/21/2017   Procedure: COLONOSCOPY WITH PROPOFOL;  Surgeon: [name], MD;  Location: [facility];  Service: Endoscopy;  Laterality: N/A;   ELECTROPHYSIOLOGIC STUDY N/A 12/21/2014   Procedure: V Tach Ablation;  Surgeon: [name], MD;  Location: [place] [facility];  Service: Cardiovascular;  Laterality: N/A;   ERCP N/A 11/16/2014   Procedure: ENDOSCOPIC RETROGRADE CHOLANGIOPANCREATOGRAPHY (ERCP);  Surgeon: [name], MD;  Location: [place];  Service: Endoscopy;  Laterality: N/A;   ESOPHAGOGASTRODUODENOSCOPY (EGD) WITH PROPOFOL N/A 12/21/2017   Procedure: ESOPHAGOGASTRODUODENOSCOPY (EGD) WITH PROPOFOL;  Surgeon: [name], MD;  [facility];  Service: Endoscopy;  Laterality: N/A;   EYE SURGERY     FOOT SURGERY Left 2005   "fixed bone that stuck out in my ankle area"   HEMORRHOID BANDING     IMPLANTABLE CARDIOVERTER DEFIBRILLATOR IMPLANT  09/06/09   BSX dual chamber ICD implanted in [place] for cardiac arrest 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   [place] Right ~ [place] N/A 11/25/2012   demonstrated EF 30%, inferior akinesis with mild hypokinesis of all walls, patent LAD and RCA stents; ostial PDA with 80-90% obstruction with medical therapy recommended   MOHS SURGERY  2008   nose, skin graft   POLYPECTOMY  12/21/2017   Procedure: POLYPECTOMY;  Surgeon: [name], MD;  [facility];  Service: Endoscopy;;   RETINAL DETACHMENT SURGERY Right 2013   TENOLYSIS Right 12/21/2013   Procedure: TENOLYSIS FLEXOR CARPI RADIALIS ,DEBRIDEMENT RIGHT JOINT WRIST,DEBRIDEMENT SCAPHOTRAPEZIAL TRAPEZOID, REPAIR OF EXTENSOR HOOD;  Surgeon: Daryll Brod, MD;  Location: [place];  Service: Orthopedics;  Laterality: Right;   TOE SURGERY Right 09/2019   3rd toe/hammer toe   V-TACH ABLATION  12/21/2014   VIDEO BRONCHOSCOPY Bilateral 01/09/2016   Procedure: VIDEO BRONCHOSCOPY WITHOUT FLUORO;  Surgeon: Juanito Doom, MD;  [facility];  Service: Cardiopulmonary;  Laterality: Bilateral;   Social History:   reports that he has quit smoking. His smoking use included cigarettes. He has a 55.00 pack-year smoking history. He has never used smokeless tobacco. He reports current alcohol use. He reports that he does not use drugs.  Family History  Problem Relation Age of Onset   Heart attack Brother    CAD Father    Hypertension Father    CAD Mother    Hypertension Mother    Hypertension Brother    Stroke Neg Hx     Medications: Patient's Medications  New Prescriptions   No medications on file  Previous Medications   ALBUTEROL (PROVENTIL) (2.5 MG/3ML) 0.083% NEBULIZER SOLUTION    USE  1 VIAL VIA NEBULIZER EVERY 6 HOURS AS NEEDED FOR WHEEZING OR SHORTNESS OF BREATH   AMIODARONE (PACERONE) 200 MG TABLET    TAKE ONE TABLET BY MOUTH DAILY   APIXABAN (ELIQUIS) 5 MG TABS TABLET    Take 1 tablet (5 mg total) by mouth 2 (two) times daily.   ARTIFICIAL TEAR SOLUTION (SOOTHE XP OP)    Place 2 drops into both eyes daily as needed (dry eyes).   BENAZEPRIL (LOTENSIN) 10 MG TABLET    Take 0.5 mg by mouth daily.   BUPROPION (WELLBUTRIN) 75 MG TABLET    TAKE ONE TABLET BY MOUTH DAILY   BUSPIRONE (BUSPAR) 15 MG TABLET    Take 1 tablet (15 mg total) by mouth 2 (two) times daily.   CARVEDILOL (COREG) 3.125 MG TABLET    Take 1 tablet (3.125 mg total) by mouth 2 (two) times daily.   CETIRIZINE (ZYRTEC) 10 MG TABLET    Take 1 tablet by mouth daily.   CYANOCOBALAMIN (VITAMIN B-12) 5000 MCG SUBL    Place under the tongue daily.   ESCITALOPRAM (LEXAPRO) 20 MG TABLET    TAKE ONE TABLET BY MOUTH DAILY   FLUTICASONE (FLONASE) 50 MCG/ACT NASAL SPRAY    Place 2 sprays  into both nostrils daily.   FLUTICASONE-UMECLIDIN-VILANT (TRELEGY ELLIPTA) 200-62.5-25 MCG/ACT AEPB    Inhale 1 puff into the lungs daily.   FUROSEMIDE (LASIX) 40 MG TABLET    TAKE 40 MG EVERY OTHER DAY AND 20 MG ON THE ALTERNATIVE DAYS   GABAPENTIN (NEURONTIN) 300 MG CAPSULE    Take 400 mg by mouth 2 (two) times daily. Pt take 1 tablet 300 mg at night and 100 mg in the morning.   GUAIFENESIN (MUCINEX) 600 MG 12 HR TABLET    Take 1 tablet by mouth 2 (two) times daily.   IPRATROPIUM-ALBUTEROL (COMBIVENT RESPIMAT) 20-100 MCG/ACT AERS RESPIMAT    Inhale 1 puff into the lungs every 6 (six) hours. Shortness of breath or wheezing   IRON PO    Take by mouth daily.    MAGNESIUM-OXIDE 400 (241.3 MG) MG TABLET    TAKE 1 TABSULE BY MOUTH DAILY   MEXILETINE (MEXITIL) 200 MG CAPSULE    Take 1 capsule (200 mg total) by mouth 2 (two) times daily.   MULTIPLE VITAMINS-MINERALS (CENTRUM ADULTS PO)    Take by mouth daily.   MUPIROCIN OINTMENT (BACTROBAN) 2 %    Apply 1 application. topically 2 (two) times daily. Apply to lower leg wounds daily prn   NITROGLYCERIN (NITROSTAT) 0.4 MG SL TABLET    Place 1 tablet (0.4 mg total) under the tongue every 5 (five) minutes as needed for chest pain. DISSOLVE 1 TABLET UNDER THE TONGUE EVERY 5 MINUTES FOR 3 DOSES   OMEPRAZOLE (PRILOSEC) 20 MG CAPSULE    Take 20 mg by mouth daily.    POTASSIUM CHLORIDE SA (KLOR-CON M20) 20 MEQ TABLET    TAKE 40 MG EVERY OTHER DAY AND 20 MG ON THE ALTERNATIVE DAYS.   RESPIRATORY THERAPY SUPPLIES (FLUTTER) DEVI    Use as directed   ROSUVASTATIN (CRESTOR) 40 MG TABLET    Take 1 tablet (40 mg total) by mouth daily.   SENNOSIDES-DOCUSATE SODIUM (STOOL SOFTENER/LAXATIVE PO)    Take 2 tablets by mouth daily.   SPACER/AERO-HOLDING CHAMBERS (OPTICHAMBER DIAMOND) MISC    optichamber VHC   STIOLTO RESPIMAT 2.5-2.5 MCG/ACT AERS    Inhale 2 puffs into the lungs daily.   TAMSULOSIN (FLOMAX) 0.4 MG CAPS CAPSULE  TAKE ONE CAPSULE BY MOUTH DAILY AFTER SUPPER   Modified Medications   No medications on file  Discontinued Medications   No medications on file    Physical Exam:  There were no vitals filed for this visit. There is no height or weight on file to calculate BMI. Wt Readings from Last 3 Encounters:  09/25/21 200 lb 9.6 oz (91 kg)  09/18/21 205 lb (93 kg)  07/17/21 207 lb 6.4 oz (94.1 kg)    Physical Exam Vitals reviewed.  Constitutional:      General: He is not in acute distress. HENT:     Head: Normocephalic.  Eyes:     General:        Right eye: No discharge.        Left eye: No discharge.  Cardiovascular:     Rate and Rhythm: Normal rate and regular rhythm.     Pulses: Normal pulses.     Heart sounds: Normal heart sounds.  Pulmonary:     Effort: Pulmonary effort is normal. No respiratory distress.     Breath sounds: Normal breath sounds. No wheezing or rales.  Abdominal:     General: Bowel sounds are normal. There is no distension.     Palpations: Abdomen is soft.     Tenderness: There is no abdominal tenderness.  Musculoskeletal:     Cervical back: Normal range of motion and neck supple. No crepitus. No pain with movement.     Right lower leg: Edema present.     Left lower leg: Edema present.     Comments: Non pitting  Skin:    General: Skin is warm and dry.     Capillary Refill: Capillary refill takes less than 2 seconds.     Comments: Approx 2 cm laceration to left eyebrow, CDI, reinforced with 3 steri strips, skin tear to right forearm and right knee, CDI, overall skin thin/fragile  Neurological:     General: No focal deficit present.     Mental Status: He is alert and oriented to person, place, and time.     Motor: Weakness present.     Gait: Gait abnormal.     Comments: cane  Psychiatric:        Mood and Affect: Mood normal.        Behavior: Behavior normal.     Labs reviewed: Basic Metabolic Panel: Recent Labs    04/01/21 1254 04/16/21 1407 05/07/21 1449 09/18/21 1605  NA 139 144 140 139   K 4.6 4.5 4.7 4.4  CL 102 105 102 103  CO2 '26 25 23 27  '$ GLUCOSE 95 91 91 75  BUN '12 11 13 11  '$ CREATININE 1.01 1.00 1.14 0.91  CALCIUM 9.5 9.7 9.5 9.5  TSH 2.360  --   --   --    Liver Function Tests: Recent Labs    04/01/21 1254 06/04/21 1036 07/14/21 1026 09/18/21 1605  AST 62* 25 33 26  ALT 60* 29 33 27  ALKPHOS 52 54 54  --   BILITOT 0.6 0.7 0.7 0.8  PROT 6.5 6.5 6.1 6.6  ALBUMIN 4.2 4.3 4.1  --    No results for input(s): "LIPASE", "AMYLASE" in the last 8760 hours. No results for input(s): "AMMONIA" in the last 8760 hours. CBC: Recent Labs    11/14/20 1502 12/20/20 1410 04/01/21 1254 09/18/21 1605  WBC 4.5 3.3* 4.1 4.6  NEUTROABS 2,907 2,056  --  2,953  HGB 12.9* 12.0* 12.0* 13.3  HCT 38.1* 36.9*  35.4* 39.1  MCV 100.5* 102.2* 100* 101.8*  PLT 119* 158 151 132*   Lipid Panel: Recent Labs    04/01/21 1254 06/04/21 1036 07/14/21 1026  CHOL 195 162 117  HDL 52 58 47  LDLCALC 129* 88 53  TRIG 78 83 88  CHOLHDL 3.8 2.8 2.5   TSH: Recent Labs    04/01/21 1254  TSH 2.360   A1C: Lab Results  Component Value Date   HGBA1C 5.6 12/29/2016     Assessment/Plan 1. Laceration of left eyebrow, subsequent encounter - due to fall 08/12 - reinforced with steri strips, CDI - CT head no acute hemorrhage, mild periventricular white matter changes seen  2. Frequent falls - ongoing - reports balance issues - has neuropathy - bruising to extremities - ambulates with cane - Ambulatory referral to Physical Therapy - Ambulatory referral to Neurology  3. Low blood pressure reading - followed by cardiology  - SBP> 100 today  4. Paroxysmal atrial fibrillation (HCC) - HR controlled with amiodarone and carvedilol - cont Eliquis for clot prevention  5. Edema of both lower extremities - non pitting today - improved with compression stockings - will reduce furosemide to 20 mg daily - furosemide (LASIX) 40 MG tablet; Take 0.5 tablets (20 mg total) by mouth  daily.  Dispense: 30 tablet; Refill: 3  6. COPD mixed type ([place]) - followed by pulmonary - cont albuterol and combivent prn - cont Stiolto  7. Neck pain - ongoing - Ambulatory referral to Neurology  8. Unstable gait - see above - Ambulatory referral to Physical Therapy - Ambulatory referral to Neurology  10. Recurrent unilateral inguinal hernia with obstruction and without gangrene - followed by [facility] - considering hernia repair surgery in future  Total time: 35 minutes. Greater than 50% of total time spent doing patient education regarding falls prevention, edema, physical therapy, symptom/medication management.    Next appt: 03/12/2022  [name], [facility] [phone]

## 2021-11-13 NOTE — Patient Instructions (Addendum)
Try to increase gabapentin to 200 mg at night for neck pain  Start taking furosemide 20 mg (1/2 pill) every morning

## 2021-11-17 ENCOUNTER — Ambulatory Visit (INDEPENDENT_AMBULATORY_CARE_PROVIDER_SITE_OTHER): Payer: Medicare Other

## 2021-11-17 DIAGNOSIS — I5022 Chronic systolic (congestive) heart failure: Secondary | ICD-10-CM | POA: Diagnosis not present

## 2021-11-17 DIAGNOSIS — I472 Ventricular tachycardia, unspecified: Secondary | ICD-10-CM

## 2021-11-20 LAB — CUP PACEART REMOTE DEVICE CHECK
Battery Remaining Longevity: 5 mo
Battery Remaining Percentage: 5 %
Brady Statistic RA Percent Paced: 1 %
Brady Statistic RV Percent Paced: 0 %
Date Time Interrogation Session: 20230824032500
HighPow Impedance: 55 Ohm
Implantable Lead Implant Date: 20110610
Implantable Lead Implant Date: 20110610
Implantable Lead Location: 753859
Implantable Lead Location: 753860
Implantable Lead Model: 185
Implantable Lead Model: 4135
Implantable Lead Serial Number: 28681386
Implantable Lead Serial Number: 339643
Implantable Pulse Generator Implant Date: 20110610
Lead Channel Impedance Value: 460 Ohm
Lead Channel Impedance Value: 634 Ohm
Lead Channel Pacing Threshold Amplitude: 0.9 V
Lead Channel Pacing Threshold Amplitude: 1.1 V
Lead Channel Pacing Threshold Pulse Width: 0.4 ms
Lead Channel Pacing Threshold Pulse Width: 0.4 ms
Lead Channel Setting Pacing Amplitude: 2 V
Lead Channel Setting Pacing Amplitude: 2.2 V
Lead Channel Setting Pacing Pulse Width: 0.4 ms
Lead Channel Setting Sensing Sensitivity: 0.6 mV
Pulse Gen Serial Number: 164892

## 2021-11-25 ENCOUNTER — Encounter: Payer: Self-pay | Admitting: Neurology

## 2021-12-02 ENCOUNTER — Ambulatory Visit: Payer: Medicare Other | Attending: Orthopedic Surgery | Admitting: Physical Therapy

## 2021-12-02 DIAGNOSIS — M6281 Muscle weakness (generalized): Secondary | ICD-10-CM | POA: Diagnosis not present

## 2021-12-02 DIAGNOSIS — R293 Abnormal posture: Secondary | ICD-10-CM

## 2021-12-02 DIAGNOSIS — M542 Cervicalgia: Secondary | ICD-10-CM | POA: Diagnosis present

## 2021-12-02 DIAGNOSIS — R2689 Other abnormalities of gait and mobility: Secondary | ICD-10-CM

## 2021-12-02 DIAGNOSIS — R296 Repeated falls: Secondary | ICD-10-CM | POA: Insufficient documentation

## 2021-12-02 NOTE — Therapy (Addendum)
OUTPATIENT PHYSICAL THERAPY VESTIBULAR EVALUATION     Patient Name: Steven Guzman MRN: [id number] DOB:1940/11/01, 81 y.o., male Today's Date: 12/03/2021  PCP: [name] NP REFERRING PROVIDER: [name] NP   PT End of Session - 12/02/21 1447     Visit Number 1    Number of Visits 12    Date for PT Re-Evaluation 01/14/22    Authorization Type [employer]    PT Start Time 8756    PT Stop Time 1530    PT Time Calculation (min) 45 min    Activity Tolerance Patient tolerated treatment well    Behavior During Therapy WFL for tasks assessed/performed             Past Medical History:  Diagnosis Date   AICD (automatic cardioverter/defibrillator) present    Aneurysm ([place])    a. Aneurysmal infrarenal aorta up to 33 mm on CT 10/2014, recommended f/u due 10/2017   Anginal pain ([place])    Anxiety    Basal cell carcinoma of nose    S/P MOHS   Biliary acute pancreatitis    CAD (coronary artery disease)    a. s/p MI in 1994 with PCI to LAD at that time b. cath 10/2012 demonstrated EF 30%, inferior akinesis with mild hypokinesis of all walls, patent LAD and RCA stents; ostial PDA with 80-90% obstruction with medical therapy recommended    Chronic systolic CHF (congestive heart failure) (HCC)    EF 30 to 35 % as of 09/2014.    CKD (chronic kidney disease), stage III ([place])    Complication of anesthesia 10/2014   "had to have defibrillator w/ERCP"   COPD (chronic obstructive pulmonary disease) ([place])    a. followed by pulmonary, COPD GOLD stage II   Depression    Diverticulosis of colon 07/2014   noted on CT   GERD (gastroesophageal reflux disease)    Hiatal hernia    Hyperglycemia 10/2012.   Hyperlipidemia    Hypertension    Myocardial infarction [facility]) 1994; 2011   Pneumonia 1946; 2015   Prostate enlargement 07/2014   observed on CT   Tobacco abuse    Ventricular tachycardia ([place])    a. 08/2009 s/p BSX E110 Teligen 100 AICD, ser#: [id number];  b. 08/2008 VT req ATP - detection  reprogrammed from 160 to 150. c. EPS and VT ablation by Dr. Lovena Le 12/21/2014   Past Surgical History:  Procedure Laterality Date   BIOPSY  12/21/2017   Procedure: BIOPSY;  Surgeon: [name], MD;  Location: [facility];  Service: Endoscopy;;   CATARACT EXTRACTION W/ INTRAOCULAR LENS  IMPLANT, BILATERAL Bilateral ~ 2011   COLONOSCOPY     COLONOSCOPY WITH PROPOFOL N/A 12/21/2017   Procedure: COLONOSCOPY WITH PROPOFOL;  Surgeon: [name], MD;  Location: [facility];  Service: Endoscopy;  Laterality: N/A;   ELECTROPHYSIOLOGIC STUDY N/A 12/21/2014   Procedure: V Tach Ablation;  Surgeon: [name], MD;  Location: [place] [facility];  Service: Cardiovascular;  Laterality: N/A;   ERCP N/A 11/16/2014   Procedure: ENDOSCOPIC RETROGRADE CHOLANGIOPANCREATOGRAPHY (ERCP);  Surgeon: [name], MD;  Location: [place];  Service: Endoscopy;  Laterality: N/A;   ESOPHAGOGASTRODUODENOSCOPY (EGD) WITH PROPOFOL N/A 12/21/2017   Procedure: ESOPHAGOGASTRODUODENOSCOPY (EGD) WITH PROPOFOL;  Surgeon: [name], MD;  [facility];  Service: Endoscopy;  Laterality: N/A;   EYE SURGERY     FOOT SURGERY Left 2005   "fixed bone that stuck out in my ankle area"  HEMORRHOID BANDING     IMPLANTABLE CARDIOVERTER DEFIBRILLATOR IMPLANT  09/06/09   BSX dual chamber ICD implanted in [place] for cardiac arrest 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   [place] Right ~ [place] N/A 11/25/2012   demonstrated EF 30%, inferior akinesis with mild hypokinesis of all walls, patent LAD and RCA stents; ostial PDA with 80-90% obstruction with medical therapy recommended   MOHS SURGERY  2008   nose, skin graft   POLYPECTOMY  12/21/2017   Procedure: POLYPECTOMY;  Surgeon: [name], MD;  [facility];  Service: Endoscopy;;   RETINAL DETACHMENT SURGERY Right 2013   TENOLYSIS Right 12/21/2013   Procedure: TENOLYSIS FLEXOR CARPI RADIALIS  ,DEBRIDEMENT RIGHT JOINT WRIST,DEBRIDEMENT SCAPHOTRAPEZIAL TRAPEZOID, REPAIR OF EXTENSOR HOOD;  Surgeon: Daryll Brod, MD;  Location: [place];  Service: Orthopedics;  Laterality: Right;   TOE SURGERY Right 09/2019   3rd toe/hammer toe   V-TACH ABLATION  12/21/2014   VIDEO BRONCHOSCOPY Bilateral 01/09/2016   Procedure: VIDEO BRONCHOSCOPY WITHOUT FLUORO;  Surgeon: Juanito Doom, MD;  [facility];  Service: Cardiopulmonary;  Laterality: Bilateral;   Patient Active Problem List   Diagnosis Date Noted   Major depressive disorder with single episode, in partial remission ([place]) 03/05/2020   Paraseptal emphysema ([place]) 12/15/2018   Tobacco abuse 12/15/2018   Perianal abscess 11/15/2018   Left inguinal hernia 11/15/2018   Leg wound, right 11/15/2018   Weight loss 09/29/2018   Dark stools 09/29/2018   Lumbar trigger point syndrome 04/20/2018   Throat and mouth symptom 02/03/2018   Gastroesophageal reflux disease without esophagitis    Esophageal stricture    Gastritis and gastroduodenitis    Degenerative cervical spinal stenosis 10/27/2017   Carotid bruit 10/27/2017   B12 deficiency 08/25/2017   Anemia 06/10/2017   Right ankle pain 04/09/2017   BPH (benign prostatic hyperplasia) 12/31/2016   Dizzinesses 05/22/2016   Mass of throat 06/30/2015   Depression 05/24/2015   Atrial fibrillation ([place]) 03/15/2015   Routine general medical examination at a health care facility 05/12/2014   Hemoptysis 03/16/2014   COPD GOLD GRADE C 56/38/7564   Chronic systolic heart failure ([place]) 07/07/2013   CAD (coronary artery disease) 11/23/2012   Smokers' cough ([place]) 11/23/2012   Ventricular tachycardia ([place]) 11/23/2012   Essential hypertension 11/23/2012   Hyperlipidemia 11/23/2012   ICD (implantable cardioverter-defibrillator) in place 11/23/2012    ONSET DATE: ~6-8 months ago  REFERRING DIAG: Frequent falls  THERAPY DIAG:  No diagnosis found.  Rationale for  Evaluation and Treatment Rehabilitation  SUBJECTIVE:   SUBJECTIVE STATEMENT: Pt reports history of vertigo but mostly complains of feelings of soreness in head/neck (ongoing 6-8 months). Feels this may be related to his balance/falls. Did not have to use cane in the house but has needed to because of back pain (this has been chronic but worse lately).  Pt accompanied by: self  PERTINENT HISTORY: Vertigo, 2 dislocated shoulders, back pain since 2012   PAIN:  Are you having pain? Not currently  PRECAUTIONS: Fall  WEIGHT BEARING RESTRICTIONS No  FALLS: Has patient fallen in last 6 months? Yes. Number of falls 1  LIVING ENVIRONMENT: Lives with: lives with their family and lives with their spouse Lives in: House/apartment Has following equipment at home: Single point cane  PLOF: Independent  PATIENT GOALS Improve neck pain so he doesn't have to sleep on a heating pad  OBJECTIVE:   DIAGNOSTIC FINDINGS: No recent imaging  COGNITION: Overall cognitive status: Within functional limits for tasks assessed   SENSATION: WFL  PALPATION: TTP suboccipitals, cervical paraspinals, rhomboids/periscapular muscles. Spring testing demos gross cervical and thoracic hypomobility  POSTURE: rounded shoulders, forward head, increased thoracic kyphosis, and flexed trunk    Cervical ROM:    Active A/PROM (deg) eval  Flexion 25  Extension 25 (sore pain)  Right lateral flexion 24  Left lateral flexion 18  Right rotation 30  Left rotation 35  (Blank rows = not tested)  STRENGTH:   UPPER EXTREMITY MMT:  MMT Right eval Left eval  Shoulder flexion 5 5  Shoulder extension 5 5  Shoulder abduction 5 5  Shoulder adduction    Shoulder internal rotation 5 5  Shoulder external rotation 3+ 3+  Middle trapezius 3+ 3+  Lower trapezius 3+ 3+  Elbow flexion    Elbow extension    Wrist flexion    Wrist extension    Wrist ulnar deviation    Wrist radial deviation    Wrist pronation     Wrist supination    Grip strength     (Blank rows = not tested)   LOWER EXTREMITY MMT:   MMT Right eval Left eval  Hip flexion 5 5  Hip abduction    Hip adduction    Hip internal rotation    Hip external rotation    Knee flexion 4+ 5  Knee extension 5 5  Ankle dorsiflexion    Ankle plantarflexion    Ankle inversion    Ankle eversion    (Blank rows = not tested)  BED MOBILITY:  Independent  GAIT: Gait pattern: step through pattern, Right foot flat, Left foot flat, knee flexed in stance- Right, knee flexed in stance- Left, and trunk flexed Distance walked: 100' Assistive device utilized: Single point cane Level of assistance: Modified independence Comments: n/a  FUNCTIONAL TESTs:  Dynamic Gait Index: 17  [facility] PT Assessment - 12/02/21 0001       Dynamic Gait Index   Level Surface Mild Impairment    Change in Gait Speed Mild Impairment    Gait with Horizontal Head Turns Moderate Impairment    Gait with Vertical Head Turns Mild Impairment    Gait and Pivot Turn Mild Impairment    Step Over Obstacle Normal    Step Around Obstacles Normal    Steps Mild Impairment    Total Score 17    DGI comment: 17/24               PATIENT EDUCATION: Education details: Exam findings, POC, TPDN Person educated: Patient Education method: Explanation, Demonstration, and Handouts Education comprehension: verbalized understanding and returned demonstration   GOALS: Goals reviewed with patient? Yes  SHORT TERM GOALS: Target date: 12/24/2021   Pt will be ind with initial HEP Baseline: Goal status: INITIAL  2.  Pt will report at least 25% decrease in neck/head pain Baseline:  Goal status: INITIAL   LONG TERM GOALS: Target date: 01/14/2022    Pt will be ind with final HEP and transition to community wellness Baseline:  Goal status: INITIAL  2.  Pt will report no longer needing heating pad to sleep per his goal Baseline:  Goal status: INITIAL  3.  Pt will demo  at least 20/24 to demo decreased risk of falls Baseline:  Goal status: INITIAL  4.  Pt will demo at least 5-10 deg improvement in C-spine ROM for driving tasks Baseline:  Goal status: INITIAL  5.  Pt will demo  at least 4/5 strength in posterior shoulder girdle to demo improved postural stability for reduced neck/back pain with amb Baseline:  Goal status: INITIAL   ASSESSMENT:  CLINICAL IMPRESSION: Patient is an 81 y.o. M who was seen today for physical therapy evaluation and treatment for neck/head pain with referral for falls and unstable gait. PMH significant for 1 recent fall, chronic back pain and history of BPPV likely adding to his current deficits. Assessment significant for poor postural stability with decreased posterior shoulder girdle strength, reduced cervical and thoracic ROM, multiple cervical muscle trigger points all leading towards reduced dynamic balance and pain affecting community and home activities. Pt would benefit from PT to address these issues.    OBJECTIVE IMPAIRMENTS Abnormal gait, decreased activity tolerance, decreased balance, decreased endurance, decreased mobility, difficulty walking, decreased ROM, decreased strength, hypomobility, increased fascial restrictions, increased muscle spasms, impaired flexibility, improper body mechanics, postural dysfunction, and pain.   ACTIVITY LIMITATIONS sleeping, transfers, and locomotion level  PARTICIPATION LIMITATIONS: driving, community activity, and yard work  PERSONAL FACTORS Age, Fitness, Past/current experiences, and 1-2 comorbidities: BPPV and back pain  are also affecting patient's functional outcome.   REHAB POTENTIAL: Good  CLINICAL DECISION MAKING: Evolving/moderate complexity  EVALUATION COMPLEXITY: Moderate   PLAN: PT FREQUENCY: 2x/week  PT DURATION: 6 weeks  PLANNED INTERVENTIONS: Therapeutic exercises, Therapeutic activity, Neuromuscular re-education, Balance training, Gait training,  Patient/Family education, Self Care, Joint mobilization, Vestibular training, Dry Needling, Spinal mobilization, Cryotherapy, Moist heat, Taping, Traction, Ionotophoresis '4mg'$ /ml Dexamethasone, Manual therapy, and Re-evaluation  PLAN FOR NEXT SESSION: Assess mCTSIB. Initiate manual therapy/TPDN for C-spine. Initiate postural strengthening and balance.    Jasmane Brockway April Ma L Keshonna Valvo, PT, DPT 12/03/2021, 7:33 AM

## 2021-12-04 ENCOUNTER — Encounter: Payer: Self-pay | Admitting: Physical Therapy

## 2021-12-04 ENCOUNTER — Ambulatory Visit: Payer: Medicare Other | Admitting: Physical Therapy

## 2021-12-04 DIAGNOSIS — R2689 Other abnormalities of gait and mobility: Secondary | ICD-10-CM

## 2021-12-04 DIAGNOSIS — M542 Cervicalgia: Secondary | ICD-10-CM

## 2021-12-04 DIAGNOSIS — M6281 Muscle weakness (generalized): Secondary | ICD-10-CM

## 2021-12-04 DIAGNOSIS — R293 Abnormal posture: Secondary | ICD-10-CM

## 2021-12-04 NOTE — Addendum Note (Signed)
Addended by: Colbert Ewing MARIE L on: 12/04/2021 09:05 AM   Modules accepted: Orders

## 2021-12-04 NOTE — Therapy (Signed)
OUTPATIENT PHYSICAL THERAPY TREATMENT     Patient Name: Steven Guzman MRN: [id number] DOB:14-Feb-1941, 81 y.o., male Today's Date: 12/04/2021  PCP: [name] NP REFERRING PROVIDER: [name] NP   PT End of Session - 12/04/21 1022     Visit Number 2    Number of Visits 12    Date for PT Re-Evaluation 01/14/22    Authorization Type [employer]    PT Start Time 1020    PT Stop Time 1100    PT Time Calculation (min) 40 min    Activity Tolerance Patient tolerated treatment well    Behavior During Therapy WFL for tasks assessed/performed             Past Medical History:  Diagnosis Date   AICD (automatic cardioverter/defibrillator) present    Aneurysm ([place])    a. Aneurysmal infrarenal aorta up to 33 mm on CT 10/2014, recommended f/u due 10/2017   Anginal pain ([place])    Anxiety    Basal cell carcinoma of nose    S/P MOHS   Biliary acute pancreatitis    CAD (coronary artery disease)    a. s/p MI in 1994 with PCI to LAD at that time b. cath 10/2012 demonstrated EF 30%, inferior akinesis with mild hypokinesis of all walls, patent LAD and RCA stents; ostial PDA with 80-90% obstruction with medical therapy recommended    Chronic systolic CHF (congestive heart failure) (HCC)    EF 30 to 35 % as of 09/2014.    CKD (chronic kidney disease), stage III ([place])    Complication of anesthesia 10/2014   "had to have defibrillator w/ERCP"   COPD (chronic obstructive pulmonary disease) ([place])    a. followed by pulmonary, COPD GOLD stage II   Depression    Diverticulosis of colon 07/2014   noted on CT   GERD (gastroesophageal reflux disease)    Hiatal hernia    Hyperglycemia 10/2012.   Hyperlipidemia    Hypertension    Myocardial infarction [facility]) 1994; 2011   Pneumonia 1946; 2015   Prostate enlargement 07/2014   observed on CT   Tobacco abuse    Ventricular tachycardia ([place])    a. 08/2009 s/p BSX E110 Teligen 100 AICD, ser#: [id number];  b. 08/2008 VT req ATP - detection reprogrammed  from 160 to 150. c. EPS and VT ablation by Dr. Lovena Le 12/21/2014   Past Surgical History:  Procedure Laterality Date   BIOPSY  12/21/2017   Procedure: BIOPSY;  Surgeon: [name], MD;  Location: [facility];  Service: Endoscopy;;   CATARACT EXTRACTION W/ INTRAOCULAR LENS  IMPLANT, BILATERAL Bilateral ~ 2011   COLONOSCOPY     COLONOSCOPY WITH PROPOFOL N/A 12/21/2017   Procedure: COLONOSCOPY WITH PROPOFOL;  Surgeon: [name], MD;  Location: [facility];  Service: Endoscopy;  Laterality: N/A;   ELECTROPHYSIOLOGIC STUDY N/A 12/21/2014   Procedure: V Tach Ablation;  Surgeon: [name], MD;  Location: [place] [facility];  Service: Cardiovascular;  Laterality: N/A;   ERCP N/A 11/16/2014   Procedure: ENDOSCOPIC RETROGRADE CHOLANGIOPANCREATOGRAPHY (ERCP);  Surgeon: [name], MD;  Location: [place];  Service: Endoscopy;  Laterality: N/A;   ESOPHAGOGASTRODUODENOSCOPY (EGD) WITH PROPOFOL N/A 12/21/2017   Procedure: ESOPHAGOGASTRODUODENOSCOPY (EGD) WITH PROPOFOL;  Surgeon: [name], MD;  [facility]: [facility];  Service: Endoscopy;  Laterality: N/A;   EYE SURGERY     FOOT SURGERY Left 2005   "fixed bone that stuck out in my ankle area"   HEMORRHOID  BANDING     IMPLANTABLE CARDIOVERTER DEFIBRILLATOR IMPLANT  09/06/09   BSX dual chamber ICD implanted in [place] for cardiac arrest 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   [place] Right ~ [place] N/A 11/25/2012   demonstrated EF 30%, inferior akinesis with mild hypokinesis of all walls, patent LAD and RCA stents; ostial PDA with 80-90% obstruction with medical therapy recommended   MOHS SURGERY  2008   nose, skin graft   POLYPECTOMY  12/21/2017   Procedure: POLYPECTOMY;  Surgeon: [name], MD;  [facility]: [facility];  Service: Endoscopy;;   RETINAL DETACHMENT SURGERY Right 2013   TENOLYSIS Right 12/21/2013   Procedure: TENOLYSIS FLEXOR CARPI RADIALIS ,DEBRIDEMENT RIGHT JOINT  WRIST,DEBRIDEMENT SCAPHOTRAPEZIAL TRAPEZOID, REPAIR OF EXTENSOR HOOD;  Surgeon: Daryll Brod, MD;  Location: [place];  Service: Orthopedics;  Laterality: Right;   TOE SURGERY Right 09/2019   3rd toe/hammer toe   V-TACH ABLATION  12/21/2014   VIDEO BRONCHOSCOPY Bilateral 01/09/2016   Procedure: VIDEO BRONCHOSCOPY WITHOUT FLUORO;  Surgeon: Juanito Doom, MD;  Location: [facility];  Service: Cardiopulmonary;  Laterality: Bilateral;   Patient Active Problem List   Diagnosis Date Noted   Major depressive disorder with single episode, in partial remission ([place]) 03/05/2020   Paraseptal emphysema ([place]) 12/15/2018   Tobacco abuse 12/15/2018   Perianal abscess 11/15/2018   Left inguinal hernia 11/15/2018   Leg wound, right 11/15/2018   Weight loss 09/29/2018   Dark stools 09/29/2018   Lumbar trigger point syndrome 04/20/2018   Throat and mouth symptom 02/03/2018   Gastroesophageal reflux disease without esophagitis    Esophageal stricture    Gastritis and gastroduodenitis    Degenerative cervical spinal stenosis 10/27/2017   Carotid bruit 10/27/2017   B12 deficiency 08/25/2017   Anemia 06/10/2017   Right ankle pain 04/09/2017   BPH (benign prostatic hyperplasia) 12/31/2016   Dizzinesses 05/22/2016   Mass of throat 06/30/2015   Depression 05/24/2015   Atrial fibrillation ([place]) 03/15/2015   Routine general medical examination at a health care facility 05/12/2014   Hemoptysis 03/16/2014   COPD GOLD GRADE C 53/66/4403   Chronic systolic heart failure ([place]) 07/07/2013   CAD (coronary artery disease) 11/23/2012   Smokers' cough ([place]) 11/23/2012   Ventricular tachycardia ([place]) 11/23/2012   Essential hypertension 11/23/2012   Hyperlipidemia 11/23/2012   ICD (implantable cardioverter-defibrillator) in place 11/23/2012    ONSET DATE: ~6-8 months ago  REFERRING DIAG: Frequent falls  THERAPY DIAG:  Cervicalgia  Abnormal posture  Muscle weakness  (generalized)  Other abnormalities of gait and mobility  Rationale for Evaluation and Treatment Rehabilitation  SUBJECTIVE:   SUBJECTIVE STATEMENT: 12/04/21: Pt states he didn't sleep well last night. Feels increased tightness in his neck today.   (From eval) Pt reports history of vertigo but mostly complains of feelings of soreness in head/neck (ongoing 6-8 months). Feels this may be related to his balance/falls. Did not have to use cane in the house but has needed to because of back pain (this has been chronic but worse lately).  Pt accompanied by: self  PERTINENT HISTORY: Vertigo, 2 dislocated shoulders, back pain since 2012   PAIN:  Are you having pain? Not currently  PRECAUTIONS: Fall  WEIGHT BEARING RESTRICTIONS No  FALLS: Has patient fallen in last 6 months? Yes. Number of falls 1  LIVING ENVIRONMENT: Lives with: lives with their family and lives with their spouse Lives in: House/apartment Has following equipment at home:  Single point cane  PLOF: Independent  PATIENT GOALS Improve neck pain so he doesn't have to sleep on a heating pad  OBJECTIVE:   DIAGNOSTIC FINDINGS: No recent imaging  COGNITION: Overall cognitive status: Within functional limits for tasks assessed   SENSATION: WFL  PALPATION: TTP suboccipitals, cervical paraspinals, rhomboids/periscapular muscles. Spring testing demos gross cervical and thoracic hypomobility  POSTURE: rounded shoulders, forward head, increased thoracic kyphosis, and flexed trunk    Cervical ROM:    Active A/PROM (deg) eval  Flexion 25  Extension 25 (sore pain)  Right lateral flexion 24  Left lateral flexion 18  Right rotation 30  Left rotation 35  (Blank rows = not tested)  STRENGTH:   UPPER EXTREMITY MMT:  MMT Right eval Left eval  Shoulder flexion 5 5  Shoulder extension 5 5  Shoulder abduction 5 5  Shoulder adduction    Shoulder internal rotation 5 5  Shoulder external rotation 3+ 3+  Middle  trapezius 3+ 3+  Lower trapezius 3+ 3+  Elbow flexion    Elbow extension    Wrist flexion    Wrist extension    Wrist ulnar deviation    Wrist radial deviation    Wrist pronation    Wrist supination    Grip strength     (Blank rows = not tested)   LOWER EXTREMITY MMT:   MMT Right eval Left eval  Hip flexion 5 5  Hip abduction    Hip adduction    Hip internal rotation    Hip external rotation    Knee flexion 4+ 5  Knee extension 5 5  Ankle dorsiflexion    Ankle plantarflexion    Ankle inversion    Ankle eversion    (Blank rows = not tested)  BED MOBILITY:  Independent  GAIT: Gait pattern: step through pattern, Right foot flat, Left foot flat, knee flexed in stance- Right, knee flexed in stance- Left, and trunk flexed Distance walked: 100' Assistive device utilized: Single point cane Level of assistance: Modified independence Comments: n/a  FUNCTIONAL TESTs:  Dynamic Gait Index: 17   TODAY'S TREATMENT  12/04/21  Manual therapy:  STM and TPR cervical paraspinals, suboccipitals, UTs, pecs    Therex: Seated UBE L3 x 2 min fwd, 1 min bwd  Standing Doorway pec low, mid, high 2x30 sec Against pool noodle: Scap squeeze 2x10 Neck retraction x10 Shoulder ER 2x10 "W" 2x10   PATIENT EDUCATION: Education details: Exam findings, POC, TPDN Person educated: Patient Education method: Explanation, Demonstration, and Handouts Education comprehension: verbalized understanding and returned demonstration  Access Code: [email or web address] URL: [email or web address] Date: 12/04/2021 Prepared by: Estill Bamberg April Thurnell Garbe  Exercises - Doorway Pec Stretch at 60 Elevation  - 1 x daily - 7 x weekly - 2 sets - 30 sec hold - Doorway Pec Stretch at 90 Degrees Abduction  - 1 x daily - 7 x weekly - 2 sets - 30 sec hold - Standing Scapular Retraction  - 1 x daily - 7 x weekly - 2 sets - 10 reps - Cervical Retraction at Wall  - 1 x daily - 7 x weekly - 1 sets - 10 reps -  Shoulder External Rotation and Scapular Retraction  - 1 x daily - 7 x weekly - 2 sets - 10 reps - Shoulder External Rotation in 45 Degrees Abduction  - 1 x daily - 7 x weekly - 2 sets - 10 reps  GOALS: Goals reviewed with patient? Yes  SHORT TERM GOALS: Target date:  12/24/2021   Pt will be ind with initial HEP Baseline: Goal status: INITIAL  2.  Pt will report at least 25% decrease in neck/head pain Baseline:  Goal status: INITIAL   LONG TERM GOALS: Target date: 01/14/2022    Pt will be ind with final HEP and transition to community wellness Baseline:  Goal status: INITIAL  2.  Pt will report no longer needing heating pad to sleep per his goal Baseline:  Goal status: INITIAL  3.  Pt will demo at least 20/24 to demo decreased risk of falls Baseline:  Goal status: INITIAL  4.  Pt will demo at least 5-10 deg improvement in C-spine ROM for driving tasks Baseline:  Goal status: INITIAL  5.  Pt will demo at least 4/5 strength in posterior shoulder girdle to demo improved postural stability for reduced neck/back pain with amb Baseline:  Goal status: INITIAL   ASSESSMENT:  CLINICAL IMPRESSION: 12/04/21: Treatment session focused on manual work through neck/shoulders/pecs. Pt reports decrease in head tension by end of session. Initiated postural strengthening exercises.  From eval: Patient is an 81 y.o. M who was seen today for physical therapy evaluation and treatment for neck/head pain with referral for falls and unstable gait. PMH significant for 1 recent fall, chronic back pain and history of BPPV likely adding to his current deficits. Assessment significant for poor postural stability with decreased posterior shoulder girdle strength, reduced cervical and thoracic ROM, multiple cervical muscle trigger points all leading towards reduced dynamic balance and pain affecting community and home activities. Pt would benefit from PT to address these issues.    OBJECTIVE IMPAIRMENTS  Abnormal gait, decreased activity tolerance, decreased balance, decreased endurance, decreased mobility, difficulty walking, decreased ROM, decreased strength, hypomobility, increased fascial restrictions, increased muscle spasms, impaired flexibility, improper body mechanics, postural dysfunction, and pain.   ACTIVITY LIMITATIONS sleeping, transfers, and locomotion level  PARTICIPATION LIMITATIONS: driving, community activity, and yard work  PERSONAL FACTORS Age, Fitness, Past/current experiences, and 1-2 comorbidities: BPPV and back pain  are also affecting patient's functional outcome.   REHAB POTENTIAL: Good  CLINICAL DECISION MAKING: Evolving/moderate complexity  EVALUATION COMPLEXITY: Moderate   PLAN: PT FREQUENCY: 2x/week  PT DURATION: 6 weeks  PLANNED INTERVENTIONS: Therapeutic exercises, Therapeutic activity, Neuromuscular re-education, Balance training, Gait training, Patient/Family education, Self Care, Joint mobilization, Vestibular training, Dry Needling, Spinal mobilization, Cryotherapy, Moist heat, Taping, Traction, Ionotophoresis '4mg'$ /ml Dexamethasone, Manual therapy, and Re-evaluation  PLAN FOR NEXT SESSION: Assess mCTSIB. Initiate manual therapy/TPDN for C-spine. Continue postural strengthening and balance.    Caine Barfield April Ma L Beauford Lando, PT, DPT 12/04/2021, 10:23 AM

## 2021-12-09 ENCOUNTER — Encounter: Payer: Medicare Other | Admitting: Physical Therapy

## 2021-12-10 ENCOUNTER — Ambulatory Visit: Payer: Medicare Other | Admitting: Physical Therapy

## 2021-12-10 ENCOUNTER — Encounter: Payer: Self-pay | Admitting: Physical Therapy

## 2021-12-10 DIAGNOSIS — M542 Cervicalgia: Secondary | ICD-10-CM

## 2021-12-10 DIAGNOSIS — R2689 Other abnormalities of gait and mobility: Secondary | ICD-10-CM

## 2021-12-10 DIAGNOSIS — R293 Abnormal posture: Secondary | ICD-10-CM

## 2021-12-10 DIAGNOSIS — M6281 Muscle weakness (generalized): Secondary | ICD-10-CM

## 2021-12-10 NOTE — Therapy (Signed)
OUTPATIENT PHYSICAL THERAPY TREATMENT     Patient Name: Steven Guzman MRN: [id number] DOB:09-15-1940, 81 y.o., male Today's Date: 12/10/2021  PCP: [name] NP REFERRING PROVIDER: [name] NP   PT End of Session - 12/10/21 1222     Visit Number 3    Number of Visits 12    Date for PT Re-Evaluation 01/14/22    Authorization - Visit Number 3    Progress Note Due on Visit 10    PT Start Time 9924    PT Stop Time 1224    PT Time Calculation (min) 39 min    Activity Tolerance Patient tolerated treatment well    Behavior During Therapy WFL for tasks assessed/performed              Past Medical History:  Diagnosis Date   AICD (automatic cardioverter/defibrillator) present    Aneurysm ([place])    a. Aneurysmal infrarenal aorta up to 33 mm on CT 10/2014, recommended f/u due 10/2017   Anginal pain ([place])    Anxiety    Basal cell carcinoma of nose    S/P MOHS   Biliary acute pancreatitis    CAD (coronary artery disease)    a. s/p MI in 1994 with PCI to LAD at that time b. cath 10/2012 demonstrated EF 30%, inferior akinesis with mild hypokinesis of all walls, patent LAD and RCA stents; ostial PDA with 80-90% obstruction with medical therapy recommended    Chronic systolic CHF (congestive heart failure) (HCC)    EF 30 to 35 % as of 09/2014.    CKD (chronic kidney disease), stage III ([place])    Complication of anesthesia 10/2014   "had to have defibrillator w/ERCP"   COPD (chronic obstructive pulmonary disease) ([place])    a. followed by pulmonary, COPD GOLD stage II   Depression    Diverticulosis of colon 07/2014   noted on CT   GERD (gastroesophageal reflux disease)    Hiatal hernia    Hyperglycemia 10/2012.   Hyperlipidemia    Hypertension    Myocardial infarction [facility]) 1994; 2011   Pneumonia 1946; 2015   Prostate enlargement 07/2014   observed on CT   Tobacco abuse    Ventricular tachycardia ([place])    a. 08/2009 s/p BSX E110 Teligen 100 AICD, ser#: [id number];  b. 08/2008 VT req  ATP - detection reprogrammed from 160 to 150. c. EPS and VT ablation by Dr. Lovena Le 12/21/2014   Past Surgical History:  Procedure Laterality Date   BIOPSY  12/21/2017   Procedure: BIOPSY;  Surgeon: [name], MD;  Location: [facility];  Service: Endoscopy;;   CATARACT EXTRACTION W/ INTRAOCULAR LENS  IMPLANT, BILATERAL Bilateral ~ 2011   COLONOSCOPY     COLONOSCOPY WITH PROPOFOL N/A 12/21/2017   Procedure: COLONOSCOPY WITH PROPOFOL;  Surgeon: [name], MD;  Location: [facility];  Service: Endoscopy;  Laterality: N/A;   ELECTROPHYSIOLOGIC STUDY N/A 12/21/2014   Procedure: V Tach Ablation;  Surgeon: [name], MD;  Location: [facility] [facility];  Service: Cardiovascular;  Laterality: N/A;   ERCP N/A 11/16/2014   Procedure: ENDOSCOPIC RETROGRADE CHOLANGIOPANCREATOGRAPHY (ERCP);  Surgeon: [name], MD;  Location: [facility];  Service: Endoscopy;  Laterality: N/A;   ESOPHAGOGASTRODUODENOSCOPY (EGD) WITH PROPOFOL N/A 12/21/2017   Procedure: ESOPHAGOGASTRODUODENOSCOPY (EGD) WITH PROPOFOL;  Surgeon: [name], MD;  [facility];  Service: Endoscopy;  Laterality: N/A;   EYE SURGERY     FOOT SURGERY Left 2005   "fixed  bone that stuck out in my ankle area"   HEMORRHOID BANDING     IMPLANTABLE CARDIOVERTER DEFIBRILLATOR IMPLANT  09/06/09   BSX dual chamber ICD implanted in [place] for cardiac arrest 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   [place] Right ~ [place] N/A 11/25/2012   demonstrated EF 30%, inferior akinesis with mild hypokinesis of all walls, patent LAD and RCA stents; ostial PDA with 80-90% obstruction with medical therapy recommended   MOHS SURGERY  2008   nose, skin graft   POLYPECTOMY  12/21/2017   Procedure: POLYPECTOMY;  Surgeon: [name], MD;  [facility]: [facility];  Service: Endoscopy;;   RETINAL DETACHMENT SURGERY Right 2013   TENOLYSIS Right 12/21/2013   Procedure: TENOLYSIS FLEXOR CARPI  RADIALIS ,DEBRIDEMENT RIGHT JOINT WRIST,DEBRIDEMENT SCAPHOTRAPEZIAL TRAPEZOID, REPAIR OF EXTENSOR HOOD;  Surgeon: Daryll Brod, MD;  Location: [place];  Service: Orthopedics;  Laterality: Right;   TOE SURGERY Right 09/2019   3rd toe/hammer toe   V-TACH ABLATION  12/21/2014   VIDEO BRONCHOSCOPY Bilateral 01/09/2016   Procedure: VIDEO BRONCHOSCOPY WITHOUT FLUORO;  Surgeon: Juanito Doom, MD;  Location: [facility];  Service: Cardiopulmonary;  Laterality: Bilateral;   Patient Active Problem List   Diagnosis Date Noted   Major depressive disorder with single episode, in partial remission ([place]) 03/05/2020   Paraseptal emphysema ([place]) 12/15/2018   Tobacco abuse 12/15/2018   Perianal abscess 11/15/2018   Left inguinal hernia 11/15/2018   Leg wound, right 11/15/2018   Weight loss 09/29/2018   Dark stools 09/29/2018   Lumbar trigger point syndrome 04/20/2018   Throat and mouth symptom 02/03/2018   Gastroesophageal reflux disease without esophagitis    Esophageal stricture    Gastritis and gastroduodenitis    Degenerative cervical spinal stenosis 10/27/2017   Carotid bruit 10/27/2017   B12 deficiency 08/25/2017   Anemia 06/10/2017   Right ankle pain 04/09/2017   BPH (benign prostatic hyperplasia) 12/31/2016   Dizzinesses 05/22/2016   Mass of throat 06/30/2015   Depression 05/24/2015   Atrial fibrillation ([place]) 03/15/2015   Routine general medical examination at a health care facility 05/12/2014   Hemoptysis 03/16/2014   COPD GOLD GRADE C 28/41/3244   Chronic systolic heart failure ([place]) 07/07/2013   CAD (coronary artery disease) 11/23/2012   Smokers' cough ([place]) 11/23/2012   Ventricular tachycardia ([place]) 11/23/2012   Essential hypertension 11/23/2012   Hyperlipidemia 11/23/2012   ICD (implantable cardioverter-defibrillator) in place 11/23/2012    ONSET DATE: ~6-8 months ago  REFERRING DIAG: Frequent falls  THERAPY DIAG:  Cervicalgia  Abnormal  posture  Muscle weakness (generalized)  Other abnormalities of gait and mobility  Rationale for Evaluation and Treatment Rehabilitation  SUBJECTIVE:   SUBJECTIVE STATEMENT: Pt states he thinks he has a kidney stone so he is not feeling well. He was sore after last session  (From eval) Pt reports history of vertigo but mostly complains of feelings of soreness in head/neck (ongoing 6-8 months). Feels this may be related to his balance/falls. Did not have to use cane in the house but has needed to because of back pain (this has been chronic but worse lately).  Pt accompanied by: self  PERTINENT HISTORY: Vertigo, 2 dislocated shoulders, back pain since 2012   PAIN:  Are you having pain? Yes. 2/10 soreness in back/kidney area  PRECAUTIONS: Fall  WEIGHT BEARING RESTRICTIONS No  FALLS: Has patient fallen in last 6 months? Yes. Number of falls 1  LIVING ENVIRONMENT:  Lives with: lives with their family and lives with their spouse Lives in: House/apartment Has following equipment at home: Single point cane  PLOF: Independent  PATIENT GOALS Improve neck pain so he doesn't have to sleep on a heating pad  OBJECTIVE:    Cervical ROM:    Active A/PROM (deg) eval  Flexion 25  Extension 25 (sore pain)  Right lateral flexion 24  Left lateral flexion 18  Right rotation 30  Left rotation 35  (Blank rows = not tested)  STRENGTH:   UPPER EXTREMITY MMT:  MMT Right eval Left eval  Shoulder flexion 5 5  Shoulder extension 5 5  Shoulder abduction 5 5  Shoulder adduction    Shoulder internal rotation 5 5  Shoulder external rotation 3+ 3+  Middle trapezius 3+ 3+  Lower trapezius 3+ 3+  Elbow flexion    Elbow extension    Wrist flexion    Wrist extension    Wrist ulnar deviation    Wrist radial deviation    Wrist pronation    Wrist supination    Grip strength     (Blank rows = not tested)   LOWER EXTREMITY MMT:   MMT Right eval Left eval  Hip flexion 5 5  Hip  abduction    Hip adduction    Hip internal rotation    Hip external rotation    Knee flexion 4+ 5  Knee extension 5 5  Ankle dorsiflexion    Ankle plantarflexion    Ankle inversion    Ankle eversion    (Blank rows = not tested)   GAIT: Gait pattern: step through pattern, Right foot flat, Left foot flat, knee flexed in stance- Right, knee flexed in stance- Left, and trunk flexed Distance walked: 100' Assistive device utilized: Single point cane Level of assistance: Modified independence Comments: n/a  FUNCTIONAL TESTs:  Dynamic Gait Index: 17   TODAY'S TREATMENT  12/10/21  UBE L2 x 4 min alt fwd/bkwd    Doorway stretch mid and high 2 x 30 sec    With pool noodle behind back:  Scap squeeze 2 x 10  L's 2 x 10  W 2 x 10  Chin tuck x 10   Balance:  Semi tandem stance on ground 2 x 30 sec bilat  SLS Lt 5 sec, Rt 3 sec  Rocker board x 1 min each A/P and laterally  Standing on foam head turns horizontally and vertically 2 x 30 sec each  Gait backwards and with head turns horizontally and A/P with CGA  12/04/21  Manual therapy:  STM and TPR cervical paraspinals, suboccipitals, UTs, pecs    Therex: Seated UBE L3 x 2 min fwd, 1 min bwd  Standing Doorway pec low, mid, high 2x30 sec Against pool noodle: Scap squeeze 2x10 Neck retraction x10 Shoulder ER 2x10 "W" 2x10   PATIENT EDUCATION: Education details: Exam findings, POC, TPDN Person educated: Patient Education method: Explanation, Demonstration, and Handouts Education comprehension: verbalized understanding and returned demonstration  Access Code: [email or web address] URL: [email or web address] Date: 12/04/2021 Prepared by: Estill Bamberg April Thurnell Garbe  Exercises - Doorway Pec Stretch at 60 Elevation  - 1 x daily - 7 x weekly - 2 sets - 30 sec hold - Doorway Pec Stretch at 90 Degrees Abduction  - 1 x daily - 7 x weekly - 2 sets - 30 sec hold - Standing Scapular Retraction  - 1 x daily - 7 x weekly - 2 sets  - 10 reps - Cervical Retraction at  Wall  - 1 x daily - 7 x weekly - 1 sets - 10 reps - Shoulder External Rotation and Scapular Retraction  - 1 x daily - 7 x weekly - 2 sets - 10 reps - Shoulder External Rotation in 45 Degrees Abduction  - 1 x daily - 7 x weekly - 2 sets - 10 reps  GOALS: Goals reviewed with patient? Yes  SHORT TERM GOALS: Target date: 12/24/2021   Pt will be ind with initial HEP Baseline: Goal status: INITIAL  2.  Pt will report at least 25% decrease in neck/head pain Baseline:  Goal status: INITIAL   LONG TERM GOALS: Target date: 01/14/2022    Pt will be ind with final HEP and transition to community wellness Baseline:  Goal status: INITIAL  2.  Pt will report no longer needing heating pad to sleep per his goal Baseline:  Goal status: INITIAL  3.  Pt will demo at least 20/24 to demo decreased risk of falls Baseline:  Goal status: INITIAL  4.  Pt will demo at least 5-10 deg improvement in C-spine ROM for driving tasks Baseline:  Goal status: INITIAL  5.  Pt will demo at least 4/5 strength in posterior shoulder girdle to demo improved postural stability for reduced neck/back pain with amb Baseline:  Goal status: INITIAL   ASSESSMENT:  CLINICAL IMPRESSION: Focused on neck/postural strength and stretching as well as balance today. Pt with most LOB with head turns vertically. Pt requires cues for technique with all postural exercises  From eval: Patient is an 81 y.o. M who was seen today for physical therapy evaluation and treatment for neck/head pain with referral for falls and unstable gait. PMH significant for 1 recent fall, chronic back pain and history of BPPV likely adding to his current deficits. Assessment significant for poor postural stability with decreased posterior shoulder girdle strength, reduced cervical and thoracic ROM, multiple cervical muscle trigger points all leading towards reduced dynamic balance and pain affecting community and home  activities. Pt would benefit from PT to address these issues.     PLAN: PT FREQUENCY: 2x/week  PT DURATION: 6 weeks  PLANNED INTERVENTIONS: Therapeutic exercises, Therapeutic activity, Neuromuscular re-education, Balance training, Gait training, Patient/Family education, Self Care, Joint mobilization, Vestibular training, Dry Needling, Spinal mobilization, Cryotherapy, Moist heat, Taping, Traction, Ionotophoresis '4mg'$ /ml Dexamethasone, Manual therapy, and Re-evaluation  PLAN FOR NEXT SESSION: Assess mCTSIB. Initiate manual therapy/TPDN for C-spine. Continue postural strengthening and balance.    Shalan Neault, PT, DPT 12/10/2021, 12:24 PM

## 2021-12-11 ENCOUNTER — Ambulatory Visit: Payer: Medicare Other | Admitting: Adult Health

## 2021-12-11 ENCOUNTER — Encounter: Payer: Self-pay | Admitting: Adult Health

## 2021-12-11 VITALS — BP 134/88 | HR 67 | Resp 16 | Ht 72.0 in | Wt 197.8 lb

## 2021-12-11 DIAGNOSIS — R350 Frequency of micturition: Secondary | ICD-10-CM

## 2021-12-11 DIAGNOSIS — K4090 Unilateral inguinal hernia, without obstruction or gangrene, not specified as recurrent: Secondary | ICD-10-CM | POA: Diagnosis not present

## 2021-12-11 DIAGNOSIS — M545 Low back pain, unspecified: Secondary | ICD-10-CM | POA: Diagnosis not present

## 2021-12-11 LAB — POCT URINALYSIS DIPSTICK
Bilirubin, UA: NEGATIVE
Blood, UA: NEGATIVE
Glucose, UA: NEGATIVE
Ketones, UA: NEGATIVE
Leukocytes, UA: NEGATIVE
Nitrite, UA: NEGATIVE
Protein, UA: NEGATIVE
Spec Grav, UA: 1.02 (ref 1.010–1.025)
Urobilinogen, UA: 0.2 E.U./dL
pH, UA: 8 (ref 5.0–8.0)

## 2021-12-11 NOTE — Patient Instructions (Signed)
Lumbosacral Radiculopathy Lumbosacral radiculopathy is a condition that involves the spinal nerves and nerve roots in the low back and bottom of the spine. The condition develops when these nerves and nerve roots move out of place or become inflamed and cause symptoms. What are the causes? This condition may be caused by: Pressure from a disk that bulges out of place (herniated disk). A disk is a plate of soft cartilage that separates bones in the spine. Disk changes that occur with age (disk degeneration). A narrowing of the bones of the lower back (spinal stenosis). A tumor. An infection. An injury that places sudden pressure on the disks that cushion the bones of your lower spine. What increases the risk? You are more likely to develop this condition if: You are a male who is 30-50 years old. You are a male who is 50-60 years old. You use improper technique when lifting things. You are overweight or live a sedentary lifestyle. You smoke. Your work requires frequent lifting. You do repetitive activities that strain the spine. What are the signs or symptoms? Symptoms of this condition include: Pain that goes down from your back into your legs (sciatica), usually on one side of the body. This is the most common symptom. The pain may be worse when you sit, cough, or sneeze. Tingling and numbness in your legs. Muscle weakness in your legs. Loss of bladder control or bowel control. How is this diagnosed? This condition may be diagnosed based on: Your symptoms and medical history. A physical exam. If the pain lasts, you may have tests, such as: MRI. X-ray. CT scan. A type of CT scan used to examine the spinal canal after injecting a dye into your spine (myelogram). A test to measure how electrical impulses move through a nerve (nerve conduction study). A test to measure the electrical activity in muscles (electromyogram). How is this treated? In many cases, treatment is not needed  for this condition. With rest, the condition usually gets better over time. If treatment is needed, it may include: Working with a physical therapist to improve strength and flexibility. Taking pain medicine. Applying heat or ice or both to the affected areas. Having chiropractic spinal manipulation. Using transcutaneous electrical nerve stimulation (TENS) therapy. Getting a steroid injection in the spine. Having surgery. This may be needed if other treatments do not help. Different types of surgery may be done depending on the cause of this condition. Follow these instructions at home: Activity Avoid bending and any other activities that make the problem worse. Maintain a proper position when standing or sitting. When standing, keep your upper back and neck straight with your shoulders pulled back. Avoid slouching. When sitting, keep your back straight and relax your shoulders. Do not round your shoulders or pull them backward. Do not sit or stand in one place for long periods of time. Take brief periods of rest throughout the day. This will reduce your pain. It is usually better to rest by lying down or standing, not sitting. Mix in mild activity or stretching between long periods of rest. This will help to prevent stiffness and pain. Get regular exercise. Ask your health care provider what activities are safe for you. If you were shown how to do any exercises or stretches, do them as told by your health care provider. You may have to avoid lifting. Ask your health care provider how much you can safely lift. Always use proper lifting technique, which includes: Bending your knees. Keeping the load close   to your body. Avoiding twisting. Managing pain If directed, put ice on the affected area. To do this: Put ice in a plastic bag. Place a towel between your skin and the bag. Leave the ice on for 20 minutes, 2-3 times a day. Remove the ice if your skin turns bright red. This is very  important. If you cannot feel pain, heat, or cold, you have a greater risk of damage to the area. If directed, apply heat to the affected area as often as told by your health care provider. Use the heat source that your health care provider recommends, such as a moist heat pack or a heating pad. Place a towel between your skin and the heat source. Leave the heat on for 20-30 minutes. Remove the heat if your skin turns bright red. This is especially important if you are unable to feel pain, heat, or cold. You have a greater risk of getting burned. Take over-the-counter and prescription medicines only as told by your health care provider. General instructions Sleep on a firm mattress in a comfortable position. Try lying on your side with your knees slightly bent. If you lie on your back, put a pillow under your knees. Ask your health care provider if the medicine prescribed to you requires you to avoid driving or using machinery. If your health care provider prescribed a diet or exercise program, follow it as told. Keep all follow-up visits. This is important. Contact a health care provider if: Your pain does not get better over time, even when taking pain medicines. Get help right away if: You develop severe pain. Your pain suddenly gets worse. You develop increasing weakness in your legs. You lose the ability to control your bladder or bowel. You have difficulty walking or balancing. You have a fever. Summary Lumbosacral radiculopathy is a condition that occurs when the spinal nerves and nerve roots in the lower part of the spine move out of place or become inflamed and cause symptoms. Symptoms include pain, numbness, and tingling that go down from your back into your legs (sciatica), muscle weakness, and loss of bladder control or bowel control. If directed, apply ice or heat or both to the affected area as told by your health care provider. Follow instructions about activity, rest, and  proper lifting technique. This information is not intended to replace advice given to you by your health care provider. Make sure you discuss any questions you have with your health care provider. Document Revised: 09/19/2020 Document Reviewed: 09/19/2020 [employer] Patient Education  2023 [employer]  

## 2021-12-11 NOTE — Progress Notes (Signed)
[facility] clinic  Provider:  Durenda Age DNP  Code Status:  Full Code  Goals of Care:     12/02/2021    2:47 PM  Advanced Directives  Does Patient Have a Medical Advance Directive? Yes  Type of Paramedic of Preemption;Living will  Does patient want to make changes to medical advance directive? Yes (MAU/Ambulatory/Procedural Areas - Information given)     Chief Complaint  Patient presents with   Back Pain    Lower back pain, better today    HPI: Patient is a 81 y.o. male seen today for an acute visit for lower back, bilateral, without sciatica. He felt the pain 5 days ago and now feels better, no more pain today. He is currently having PT due to "tight neck". He complains of increased urinary frequency. He denies hematuria, fever nor dysuria.   Urine dipstick showed negative leukocyte, negative nitrite and negative blood.  He has a left inguinal hernia, denies pain. He follows up with a Psychologist, sport and exercise.  Past Medical History:  Diagnosis Date   AICD (automatic cardioverter/defibrillator) present    Aneurysm ([place])    a. Aneurysmal infrarenal aorta up to 33 mm on CT 10/2014, recommended f/u due 10/2017   Anginal pain ([place])    Anxiety    Basal cell carcinoma of nose    S/P MOHS   Biliary acute pancreatitis    CAD (coronary artery disease)    a. s/p MI in 1994 with PCI to LAD at that time b. cath 10/2012 demonstrated EF 30%, inferior akinesis with mild hypokinesis of all walls, patent LAD and RCA stents; ostial PDA with 80-90% obstruction with medical therapy recommended    Chronic systolic CHF (congestive heart failure) (HCC)    EF 30 to 35 % as of 09/2014.    CKD (chronic kidney disease), stage III ([place])    Complication of anesthesia 10/2014   "had to have defibrillator w/ERCP"   COPD (chronic obstructive pulmonary disease) ([place])    a. followed by pulmonary, COPD GOLD stage II   Depression    Diverticulosis of colon 07/2014   noted on CT   GERD  (gastroesophageal reflux disease)    Hiatal hernia    Hyperglycemia 10/2012.   Hyperlipidemia    Hypertension    Myocardial infarction [facility]) 1994; 2011   Pneumonia 1946; 2015   Prostate enlargement 07/2014   observed on CT   Tobacco abuse    Ventricular tachycardia ([place])    a. 08/2009 s/p BSX E110 Teligen 100 AICD, ser#: [id number];  b. 08/2008 VT req ATP - detection reprogrammed from 160 to 150. c. EPS and VT ablation by Dr. Lovena Le 12/21/2014    Past Surgical History:  Procedure Laterality Date   BIOPSY  12/21/2017   Procedure: BIOPSY;  Surgeon: [name], MD;  [facility];  Service: Endoscopy;;   CATARACT EXTRACTION W/ INTRAOCULAR LENS  IMPLANT, BILATERAL Bilateral ~ 2011   COLONOSCOPY     COLONOSCOPY WITH PROPOFOL N/A 12/21/2017   Procedure: COLONOSCOPY WITH PROPOFOL;  Surgeon: [name], MD;  [facility];  Service: Endoscopy;  Laterality: N/A;   ELECTROPHYSIOLOGIC STUDY N/A 12/21/2014   Procedure: V Tach Ablation;  Surgeon: [name], MD;  Location: [place] CV LAB;  Service: Cardiovascular;  Laterality: N/A;   ERCP N/A 11/16/2014   Procedure: ENDOSCOPIC RETROGRADE CHOLANGIOPANCREATOGRAPHY (ERCP);  Surgeon: [name], MD;  Location: [place];  Service: Endoscopy;  Laterality: N/A;   ESOPHAGOGASTRODUODENOSCOPY (EGD)  WITH PROPOFOL N/A 12/21/2017   Procedure: ESOPHAGOGASTRODUODENOSCOPY (EGD) WITH PROPOFOL;  Surgeon: [name], MD;  [facility];  Service: Endoscopy;  Laterality: N/A;   EYE SURGERY     FOOT SURGERY Left 2005   "fixed bone that stuck out in my ankle area"   HEMORRHOID BANDING     IMPLANTABLE CARDIOVERTER DEFIBRILLATOR IMPLANT  09/06/09   BSX dual chamber ICD implanted in [place] for cardiac arrest 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   [place] Right ~ [place] N/A 11/25/2012   demonstrated EF 30%, inferior akinesis with mild hypokinesis of all walls, patent LAD and RCA  stents; ostial PDA with 80-90% obstruction with medical therapy recommended   MOHS SURGERY  2008   nose, skin graft   POLYPECTOMY  12/21/2017   Procedure: POLYPECTOMY;  Surgeon: [name], MD;  [facility];  Service: Endoscopy;;   RETINAL DETACHMENT SURGERY Right 2013   TENOLYSIS Right 12/21/2013   Procedure: TENOLYSIS FLEXOR CARPI RADIALIS ,DEBRIDEMENT RIGHT JOINT WRIST,DEBRIDEMENT SCAPHOTRAPEZIAL TRAPEZOID, REPAIR OF EXTENSOR HOOD;  Surgeon: Daryll Brod, MD;  Location: [place];  Service: Orthopedics;  Laterality: Right;   TOE SURGERY Right 09/2019   3rd toe/hammer toe   V-TACH ABLATION  12/21/2014   VIDEO BRONCHOSCOPY Bilateral 01/09/2016   Procedure: VIDEO BRONCHOSCOPY WITHOUT FLUORO;  Surgeon: Juanito Doom, MD;  [facility];  Service: Cardiopulmonary;  Laterality: Bilateral;    Allergies  Allergen Reactions   Sulfa Antibiotics Hives    Outpatient Encounter Medications as of 12/11/2021  Medication Sig   albuterol (PROVENTIL) (2.5 MG/3ML) 0.083% nebulizer solution USE 1 VIAL VIA NEBULIZER EVERY 6 HOURS AS NEEDED FOR WHEEZING OR SHORTNESS OF BREATH   amiodarone (PACERONE) 200 MG tablet TAKE ONE TABLET BY MOUTH DAILY   apixaban (ELIQUIS) 5 MG TABS tablet Take 1 tablet (5 mg total) by mouth 2 (two) times daily.   Artificial Tear Solution (SOOTHE XP OP) Place 2 drops into both eyes daily as needed (dry eyes).   benazepril (LOTENSIN) 10 MG tablet Take 0.5 mg by mouth daily.   buPROPion (WELLBUTRIN) 75 MG tablet TAKE ONE TABLET BY MOUTH DAILY   busPIRone (BUSPAR) 15 MG tablet Take 1 tablet (15 mg total) by mouth 2 (two) times daily.   carvedilol (COREG) 3.125 MG tablet Take 1 tablet (3.125 mg total) by mouth 2 (two) times daily.   cetirizine (ZYRTEC) 10 MG tablet Take 1 tablet by mouth daily.   Cyanocobalamin (VITAMIN B-12) 5000 MCG SUBL Place under the tongue daily.   escitalopram (LEXAPRO) 20 MG tablet TAKE ONE TABLET BY MOUTH DAILY    fluticasone (FLONASE) 50 MCG/ACT nasal spray Place 2 sprays into both nostrils daily.   furosemide (LASIX) 40 MG tablet Take 0.5 tablets (20 mg total) by mouth daily.   gabapentin (NEURONTIN) 300 MG capsule Take 400 mg by mouth 2 (two) times daily. Pt take 1 tablet 300 mg at night and 100 mg in the morning.   guaiFENesin (MUCINEX) 600 MG 12 hr tablet Take 1 tablet by mouth 2 (two) times daily.   Ipratropium-Albuterol (COMBIVENT RESPIMAT) 20-100 MCG/ACT AERS respimat Inhale 1 puff into the lungs every 6 (six) hours. Shortness of breath or wheezing   IRON PO Take by mouth daily.    MAGNESIUM-OXIDE 400 (241.3 Mg) MG tablet TAKE 1 TABSULE BY MOUTH DAILY   mexiletine (MEXITIL) 200 MG capsule Take 1 capsule (200 mg total) by mouth 2 (two)  times daily.   Multiple Vitamins-Minerals (CENTRUM ADULTS PO) Take by mouth daily.   mupirocin ointment (BACTROBAN) 2 % Apply 1 application. topically 2 (two) times daily. Apply to lower leg wounds daily prn   nitroGLYCERIN (NITROSTAT) 0.4 MG SL tablet Place 1 tablet (0.4 mg total) under the tongue every 5 (five) minutes as needed for chest pain. DISSOLVE 1 TABLET UNDER THE TONGUE EVERY 5 MINUTES FOR 3 DOSES   omeprazole (PRILOSEC) 20 MG capsule Take 20 mg by mouth daily.    potassium chloride SA (KLOR-CON M20) 20 MEQ tablet TAKE 40 MG EVERY OTHER DAY AND 20 MG ON THE ALTERNATIVE DAYS.   Respiratory Therapy Supplies (FLUTTER) DEVI Use as directed   rosuvastatin (CRESTOR) 40 MG tablet Take 1 tablet (40 mg total) by mouth daily.   Sennosides-Docusate Sodium (STOOL SOFTENER/LAXATIVE PO) Take 2 tablets by mouth daily.   Spacer/Aero-Holding Chambers (OPTICHAMBER DIAMOND) MISC optichamber VHC   STIOLTO RESPIMAT 2.5-2.5 MCG/ACT AERS Inhale 2 puffs into the lungs daily.   tamsulosin (FLOMAX) 0.4 MG CAPS capsule TAKE ONE CAPSULE BY MOUTH DAILY AFTER SUPPER   No facility-administered encounter medications on file as of 12/11/2021.    Review of Systems:  Review of Systems   Constitutional:  Negative for activity change, appetite change and fever.  HENT:  Negative for sore throat.   Eyes: Negative.   Cardiovascular:  Negative for chest pain and leg swelling.  Gastrointestinal:  Negative for abdominal distention, diarrhea and vomiting.  Genitourinary:  Positive for frequency. Negative for dysuria and urgency.  Musculoskeletal:  Positive for back pain.       Lower back pain X 5 days  Skin:  Negative for color change.  Neurological:  Negative for dizziness and headaches.  Psychiatric/Behavioral:  Negative for behavioral problems and sleep disturbance. The patient is not nervous/anxious.     Health Maintenance  Topic Date Due   Zoster Vaccines- Shingrix (1 of 2) Never done   COVID-19 Vaccine (5 - Pfizer risk series) 10/20/2020   INFLUENZA VACCINE  10/28/2021   Pneumonia Vaccine 14+ Years old (2 - PPSV23 or PCV20) 03/20/2022 (Originally 07/06/2014)   TETANUS/TDAP  05/11/2024   HPV VACCINES  Aged Out    Physical Exam: Vitals:   12/11/21 1517  BP: 134/88  Pulse: 67  Resp: 16  SpO2: 99%  Weight: 197 lb 12.8 oz (89.7 kg)  Height: 6' (1.829 m)   Body mass index is 26.83 kg/m. Physical Exam Constitutional:      Appearance: Normal appearance.  HENT:     Head: Normocephalic and atraumatic.     Mouth/Throat:     Mouth: Mucous membranes are moist.  Eyes:     Conjunctiva/sclera: Conjunctivae normal.  Cardiovascular:     Rate and Rhythm: Normal rate and regular rhythm.     Pulses: Normal pulses.     Heart sounds: Normal heart sounds.  Pulmonary:     Effort: Pulmonary effort is normal.     Breath sounds: Normal breath sounds.  Abdominal:     General: Bowel sounds are normal.     Palpations: Abdomen is soft.  Genitourinary:    Comments: Left inguinal hernia. Musculoskeletal:        General: No swelling. Normal range of motion.     Cervical back: Normal range of motion.  Skin:    General: Skin is warm and dry.  Neurological:     General: No  focal deficit present.     Mental Status: He is alert and oriented  to person, place, and time.  Psychiatric:        Mood and Affect: Mood normal.        Behavior: Behavior normal.        Thought Content: Thought content normal.        Judgment: Judgment normal.     Labs reviewed: Basic Metabolic Panel: Recent Labs    04/01/21 1254 04/16/21 1407 05/07/21 1449 09/18/21 1605  NA 139 144 140 139  K 4.6 4.5 4.7 4.4  CL 102 105 102 103  CO2 '26 25 23 27  '$ GLUCOSE 95 91 91 75  BUN '12 11 13 11  '$ CREATININE 1.01 1.00 1.14 0.91  CALCIUM 9.5 9.7 9.5 9.5  TSH 2.360  --   --   --    Liver Function Tests: Recent Labs    04/01/21 1254 06/04/21 1036 07/14/21 1026 09/18/21 1605  AST 62* 25 33 26  ALT 60* 29 33 27  ALKPHOS 52 54 54  --   BILITOT 0.6 0.7 0.7 0.8  PROT 6.5 6.5 6.1 6.6  ALBUMIN 4.2 4.3 4.1  --    No results for input(s): "LIPASE", "AMYLASE" in the last 8760 hours. No results for input(s): "AMMONIA" in the last 8760 hours. CBC: Recent Labs    12/20/20 1410 04/01/21 1254 09/18/21 1605  WBC 3.3* 4.1 4.6  NEUTROABS 2,056  --  2,953  HGB 12.0* 12.0* 13.3  HCT 36.9* 35.4* 39.1  MCV 102.2* 100* 101.8*  PLT 158 151 132*   Lipid Panel: Recent Labs    04/01/21 1254 06/04/21 1036 07/14/21 1026  CHOL 195 162 117  HDL 52 58 47  LDLCALC 129* 88 53  TRIG 78 83 88  CHOLHDL 3.8 2.8 2.5   Lab Results  Component Value Date   HGBA1C 5.6 12/29/2016    Procedures since last visit: [place]  Result Date: 11/20/2021 Scheduled remote reviewed. Normal device function.  Numerous NSVT/VT, EGM's show 1:1 Battery estimated 26moRoute to triage Next remote to be determined [place]   Assessment/Plan  1. Low back pain, unspecified back pain laterality, unspecified chronicity, unspecified whether sciatica present -  continue PT, for therapeutic exercises -  continue Gabapentin - POC Urinalysis Dipstick  2. Urinary frequency - POC Urinalysis Dipstick   negative leukocyte, negative nitrite and negative blood. -   negative for UTI  3. Left inguinal hernia -  denies pain -  follow up with surgeon   Labs/tests ordered:   urine dipstick  Next appt:  03/12/2022

## 2021-12-12 ENCOUNTER — Ambulatory Visit: Payer: Medicare Other | Admitting: Physical Therapy

## 2021-12-12 ENCOUNTER — Encounter: Payer: Self-pay | Admitting: Physical Therapy

## 2021-12-12 DIAGNOSIS — R2689 Other abnormalities of gait and mobility: Secondary | ICD-10-CM

## 2021-12-12 DIAGNOSIS — M542 Cervicalgia: Secondary | ICD-10-CM | POA: Diagnosis not present

## 2021-12-12 DIAGNOSIS — M6281 Muscle weakness (generalized): Secondary | ICD-10-CM

## 2021-12-12 DIAGNOSIS — R293 Abnormal posture: Secondary | ICD-10-CM

## 2021-12-12 NOTE — Therapy (Signed)
OUTPATIENT PHYSICAL THERAPY TREATMENT     Patient Name: Steven Guzman MRN: [id number] DOB:1941-03-25, 81 y.o., male Today's Date: 12/12/2021  PCP: [name] NP REFERRING PROVIDER: [name] NP   PT End of Session - 12/12/21 1053     Visit Number 4    Number of Visits 12    Date for PT Re-Evaluation 01/14/22    Authorization - Visit Number 4    Progress Note Due on Visit 10    PT Start Time 5277    PT Stop Time 1055    PT Time Calculation (min) 40 min    Activity Tolerance Patient tolerated treatment well    Behavior During Therapy WFL for tasks assessed/performed               Past Medical History:  Diagnosis Date   AICD (automatic cardioverter/defibrillator) present    Aneurysm ([place])    a. Aneurysmal infrarenal aorta up to 33 mm on CT 10/2014, recommended f/u due 10/2017   Anginal pain ([place])    Anxiety    Basal cell carcinoma of nose    S/P MOHS   Biliary acute pancreatitis    CAD (coronary artery disease)    a. s/p MI in 1994 with PCI to LAD at that time b. cath 10/2012 demonstrated EF 30%, inferior akinesis with mild hypokinesis of all walls, patent LAD and RCA stents; ostial PDA with 80-90% obstruction with medical therapy recommended    Chronic systolic CHF (congestive heart failure) (HCC)    EF 30 to 35 % as of 09/2014.    CKD (chronic kidney disease), stage III ([place])    Complication of anesthesia 10/2014   "had to have defibrillator w/ERCP"   COPD (chronic obstructive pulmonary disease) ([place])    a. followed by pulmonary, COPD GOLD stage II   Depression    Diverticulosis of colon 07/2014   noted on CT   GERD (gastroesophageal reflux disease)    Hiatal hernia    Hyperglycemia 10/2012.   Hyperlipidemia    Hypertension    Myocardial infarction [facility]) 1994; 2011   Pneumonia 1946; 2015   Prostate enlargement 07/2014   observed on CT   Tobacco abuse    Ventricular tachycardia ([place])    a. 08/2009 s/p BSX E110 Teligen 100 AICD, ser#: [id number];  b. 08/2008 VT  req ATP - detection reprogrammed from 160 to 150. c. EPS and VT ablation by Dr. Lovena Le 12/21/2014   Past Surgical History:  Procedure Laterality Date   BIOPSY  12/21/2017   Procedure: BIOPSY;  Surgeon: [name], MD;  [facility];  Service: Endoscopy;;   CATARACT EXTRACTION W/ INTRAOCULAR LENS  IMPLANT, BILATERAL Bilateral ~ 2011   COLONOSCOPY     COLONOSCOPY WITH PROPOFOL N/A 12/21/2017   Procedure: COLONOSCOPY WITH PROPOFOL;  Surgeon: [name], MD;  [facility];  Service: Endoscopy;  Laterality: N/A;   ELECTROPHYSIOLOGIC STUDY N/A 12/21/2014   Procedure: V Tach Ablation;  Surgeon: [name], MD;  Location: [place] CV LAB;  Service: Cardiovascular;  Laterality: N/A;   ERCP N/A 11/16/2014   Procedure: ENDOSCOPIC RETROGRADE CHOLANGIOPANCREATOGRAPHY (ERCP);  Surgeon: [name], MD;  Location: [place];  Service: Endoscopy;  Laterality: N/A;   ESOPHAGOGASTRODUODENOSCOPY (EGD) WITH PROPOFOL N/A 12/21/2017   Procedure: ESOPHAGOGASTRODUODENOSCOPY (EGD) WITH PROPOFOL;  Surgeon: [name], MD;  [facility];  Service: Endoscopy;  Laterality: N/A;   EYE SURGERY     FOOT SURGERY Left 2005   "  fixed bone that stuck out in my ankle area"   [place]  09/06/09   BSX dual chamber ICD implanted in [place] for cardiac arrest 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   [place] Right ~ [place] N/A 11/25/2012   demonstrated EF 30%, inferior akinesis with mild hypokinesis of all walls, patent LAD and RCA stents; ostial PDA with 80-90% obstruction with medical therapy recommended   MOHS SURGERY  2008   nose, skin graft   POLYPECTOMY  12/21/2017   Procedure: POLYPECTOMY;  Surgeon: [name], MD;  [facility];  Service: Endoscopy;;   RETINAL DETACHMENT SURGERY Right 2013   TENOLYSIS Right 12/21/2013   Procedure: TENOLYSIS FLEXOR CARPI  RADIALIS ,DEBRIDEMENT RIGHT JOINT WRIST,DEBRIDEMENT SCAPHOTRAPEZIAL TRAPEZOID, REPAIR OF EXTENSOR HOOD;  Surgeon: Daryll Brod, MD;  Location: [place];  Service: Orthopedics;  Laterality: Right;   TOE SURGERY Right 09/2019   3rd toe/hammer toe   V-TACH ABLATION  12/21/2014   VIDEO BRONCHOSCOPY Bilateral 01/09/2016   Procedure: VIDEO BRONCHOSCOPY WITHOUT FLUORO;  Surgeon: Juanito Doom, MD;  [facility];  Service: Cardiopulmonary;  Laterality: Bilateral;   Patient Active Problem List   Diagnosis Date Noted   Major depressive disorder with single episode, in partial remission ([place]) 03/05/2020   Paraseptal emphysema ([place]) 12/15/2018   Tobacco abuse 12/15/2018   Perianal abscess 11/15/2018   Left inguinal hernia 11/15/2018   Leg wound, right 11/15/2018   Weight loss 09/29/2018   Dark stools 09/29/2018   Lumbar trigger point syndrome 04/20/2018   Throat and mouth symptom 02/03/2018   Gastroesophageal reflux disease without esophagitis    Esophageal stricture    Gastritis and gastroduodenitis    Degenerative cervical spinal stenosis 10/27/2017   Carotid bruit 10/27/2017   B12 deficiency 08/25/2017   Anemia 06/10/2017   Right ankle pain 04/09/2017   BPH (benign prostatic hyperplasia) 12/31/2016   Dizzinesses 05/22/2016   Mass of throat 06/30/2015   Depression 05/24/2015   Atrial fibrillation ([place]) 03/15/2015   Routine general medical examination at a health care facility 05/12/2014   Hemoptysis 03/16/2014   COPD GOLD GRADE C 40/81/4481   Chronic systolic heart failure ([place]) 07/07/2013   CAD (coronary artery disease) 11/23/2012   Smokers' cough ([place]) 11/23/2012   Ventricular tachycardia ([place]) 11/23/2012   Essential hypertension 11/23/2012   Hyperlipidemia 11/23/2012   ICD (implantable cardioverter-defibrillator) in place 11/23/2012    ONSET DATE: ~6-8 months ago  REFERRING DIAG: Frequent falls  THERAPY DIAG:  Cervicalgia  Abnormal  posture  Muscle weakness (generalized)  Other abnormalities of gait and mobility  Rationale for Evaluation and Treatment Rehabilitation  SUBJECTIVE:   SUBJECTIVE STATEMENT: Pt states "my balance is off today". He states he did not sleep well last night. His neck was sore this morning but is feeling better after being up and moving around  (From eval) Pt reports history of vertigo but mostly complains of feelings of soreness in head/neck (ongoing 6-8 months). Feels this may be related to his balance/falls. Did not have to use cane in the house but has needed to because of back pain (this has been chronic but worse lately).  Pt accompanied by: self  PERTINENT HISTORY: Vertigo, 2 dislocated shoulders, back pain since 2012   PAIN:  Are you having pain? Yes. 3/10 neck  PRECAUTIONS: Fall  WEIGHT BEARING RESTRICTIONS No  FALLS: Has patient fallen in last 6  months? Yes. Number of falls 1  LIVING ENVIRONMENT: Lives with: lives with their family and lives with their spouse Lives in: House/apartment Has following equipment at home: Single point cane  PLOF: Independent  PATIENT GOALS Improve neck pain so he doesn't have to sleep on a heating pad  OBJECTIVE:    Cervical ROM:    Active A/PROM (deg) eval 12/12/21  Flexion 25 55  Extension 25 (sore pain) 20 - no pain  Right lateral flexion 24 25  Left lateral flexion 18 30  Right rotation 30   Left rotation 35   (Blank rows = not tested)  STRENGTH:   UPPER EXTREMITY MMT:  MMT Right eval Left eval  Shoulder flexion 5 5  Shoulder extension 5 5  Shoulder abduction 5 5  Shoulder adduction    Shoulder internal rotation 5 5  Shoulder external rotation 3+ 3+  Middle trapezius 3+ 3+  Lower trapezius 3+ 3+  Elbow flexion    Elbow extension    Wrist flexion    Wrist extension    Wrist ulnar deviation    Wrist radial deviation    Wrist pronation    Wrist supination    Grip strength     (Blank rows = not tested)    LOWER EXTREMITY MMT:   MMT Right eval Left eval  Hip flexion 5 5  Hip abduction    Hip adduction    Hip internal rotation    Hip external rotation    Knee flexion 4+ 5  Knee extension 5 5  Ankle dorsiflexion    Ankle plantarflexion    Ankle inversion    Ankle eversion    (Blank rows = not tested)   GAIT: Gait pattern: step through pattern, Right foot flat, Left foot flat, knee flexed in stance- Right, knee flexed in stance- Left, and trunk flexed Distance walked: 100' Assistive device utilized: Single point cane Level of assistance: Modified independence Comments: n/a  FUNCTIONAL TESTs:  Dynamic Gait Index: 17   TODAY'S TREATMENT  12/12/21  UBE L3 x 4 min alt fwd/bkwd    Doorway stretch mid and high 2 x 30 sec    With pool noodle behind back:  L x 20  W x 20  Scap squeeze x 20   Row green TB x 20  Shoulder extension x 20 green TB  Cervical retraction x 15 then x 10 with towel for resistance and to improve form  Cervical extension stretch with towel behind neck 10 x 10 sec  Upper trap stretch bilat 2 x 30 sec   Manual:  STM and TPR cervical paraspinals and bilat UT  Manual traction and cervical PROM all directions     12/10/21  UBE L2 x 4 min alt fwd/bkwd    Doorway stretch mid and high 2 x 30 sec    With pool noodle behind back:  Scap squeeze 2 x 10  L's 2 x 10  W 2 x 10  Chin tuck x 10   Balance:  Semi tandem stance on ground 2 x 30 sec bilat  SLS Lt 5 sec, Rt 3 sec  Rocker board x 1 min each A/P and laterally  Standing on foam head turns horizontally and vertically 2 x 30 sec each  Gait backwards and with head turns horizontally and A/P with CGA   PATIENT EDUCATION: Education details: Exam findings, POC, TPDN Person educated: Patient Education method: Explanation, Demonstration, and Handouts Education comprehension: verbalized understanding and returned demonstration  Access  Code: [email or web address] URL:  [email or web address] Date: 12/04/2021 Prepared by: Estill Bamberg April Thurnell Garbe  Exercises - Doorway Pec Stretch at 60 Elevation  - 1 x daily - 7 x weekly - 2 sets - 30 sec hold - Doorway Pec Stretch at 90 Degrees Abduction  - 1 x daily - 7 x weekly - 2 sets - 30 sec hold - Standing Scapular Retraction  - 1 x daily - 7 x weekly - 2 sets - 10 reps - Cervical Retraction at Wall  - 1 x daily - 7 x weekly - 1 sets - 10 reps - Shoulder External Rotation and Scapular Retraction  - 1 x daily - 7 x weekly - 2 sets - 10 reps - Shoulder External Rotation in 45 Degrees Abduction  - 1 x daily - 7 x weekly - 2 sets - 10 reps  GOALS: Goals reviewed with patient? Yes  SHORT TERM GOALS: Target date: 12/24/2021   Pt will be ind with initial HEP Baseline: Goal status: INITIAL  2.  Pt will report at least 25% decrease in neck/head pain Baseline:  Goal status: INITIAL   LONG TERM GOALS: Target date: 01/14/2022    Pt will be ind with final HEP and transition to community wellness Baseline:  Goal status: INITIAL  2.  Pt will report no longer needing heating pad to sleep per his goal Baseline:  Goal status: INITIAL  3.  Pt will demo at least 20/24 to demo decreased risk of falls Baseline:  Goal status: INITIAL  4.  Pt will demo at least 5-10 deg improvement in C-spine ROM for driving tasks Baseline:  Goal status: INITIAL  5.  Pt will demo at least 4/5 strength in posterior shoulder girdle to demo improved postural stability for reduced neck/back pain with amb Baseline:  Goal status: INITIAL   ASSESSMENT:  CLINICAL IMPRESSION: Focused on neck pain today per pt request. Added postural strengthening with resistance with good tolerance. Increased muscle spasticity palpable with manual work. Lateral flexion ROM improved and no pain with cervical extension today  From eval: Patient is an 81 y.o. M who was seen today for physical therapy evaluation and treatment for neck/head pain  with referral for falls and unstable gait. PMH significant for 1 recent fall, chronic back pain and history of BPPV likely adding to his current deficits. Assessment significant for poor postural stability with decreased posterior shoulder girdle strength, reduced cervical and thoracic ROM, multiple cervical muscle trigger points all leading towards reduced dynamic balance and pain affecting community and home activities. Pt would benefit from PT to address these issues.     PLAN: PT FREQUENCY: 2x/week  PT DURATION: 6 weeks  PLANNED INTERVENTIONS: Therapeutic exercises, Therapeutic activity, Neuromuscular re-education, Balance training, Gait training, Patient/Family education, Self Care, Joint mobilization, Vestibular training, Dry Needling, Spinal mobilization, Cryotherapy, Moist heat, Taping, Traction, Ionotophoresis '4mg'$ /ml Dexamethasone, Manual therapy, and Re-evaluation  PLAN FOR NEXT SESSION: Assess mCTSIB. Initiate manual therapy/TPDN for C-spine. Continue postural strengthening and balance.    Kealani Leckey, PT, DPT 12/12/2021, 10:54 AM

## 2021-12-14 NOTE — Progress Notes (Signed)
Remote ICD transmission.   

## 2021-12-15 ENCOUNTER — Telehealth: Payer: Self-pay | Admitting: Interventional Cardiology

## 2021-12-15 ENCOUNTER — Encounter: Payer: Self-pay | Admitting: Interventional Cardiology

## 2021-12-15 ENCOUNTER — Encounter: Payer: Medicare Other | Admitting: Physical Therapy

## 2021-12-15 NOTE — Telephone Encounter (Signed)
No objections

## 2021-12-15 NOTE — Telephone Encounter (Signed)
Patient calling to say that when dr Tamala Julian retiring in Jan and he wants to start seeing Dr. Sallyanne Kuster. Please advise

## 2021-12-15 NOTE — Telephone Encounter (Signed)
New Message:     Patient would like to change to Dr Sallyanne Kuster, he said Dr Tamala Julian was retiring

## 2021-12-15 NOTE — Telephone Encounter (Signed)
Returned call to patient, spoke with wife Steven Guzman ([place] per [facility]).  Assigned recall for both patient and Steven Guzman for Dr. Sallyanne Kuster per their request.  Steven Guzman also states patient sent a MyChart message stating he has been having chest pain on and off relieved with NTG. Steven Guzman requested appt with provider for assessment.  Office visit appt made with [name], NP on 12/19/21 at 8:00am, confirmed with Steven Guzman. Advised on ED precautions.  Steven Guzman verbalized understanding and expressed appreciation for assistance.

## 2021-12-16 ENCOUNTER — Encounter: Payer: Self-pay | Admitting: Physical Therapy

## 2021-12-16 ENCOUNTER — Ambulatory Visit: Payer: Medicare Other | Admitting: Physical Therapy

## 2021-12-16 DIAGNOSIS — R293 Abnormal posture: Secondary | ICD-10-CM

## 2021-12-16 DIAGNOSIS — M542 Cervicalgia: Secondary | ICD-10-CM

## 2021-12-16 DIAGNOSIS — M6281 Muscle weakness (generalized): Secondary | ICD-10-CM

## 2021-12-16 DIAGNOSIS — R2689 Other abnormalities of gait and mobility: Secondary | ICD-10-CM

## 2021-12-16 NOTE — Progress Notes (Signed)
Office Visit    Patient Name: Steven Guzman Date of Encounter: 12/19/2021  Primary Care Provider:  Yvonna Alanis, NP Primary Cardiologist:  Sanda Klein, MD Primary Electrophysiologist: Cristopher Peru, MD  Chief Complaint    Steven Guzman is a 81 y.o. male with PMH of CAD s/p inferior MI in 1995 with cardiac arrest, MI and 2011 treated with DES to LAD/RCA AICD implant 2010, VT ablation 2016, PAF (on Eliquis), AAA, HLD, HTN, CKD stage III, ICM, GERD who presents today with complaint of chest pain. Past Medical History    Past Medical History:  Diagnosis Date   AICD (automatic cardioverter/defibrillator) present    Aneurysm ([place])    a. Aneurysmal infrarenal aorta up to 33 mm on CT 10/2014, recommended f/u due 10/2017   Anginal pain ([place])    Anxiety    Basal cell carcinoma of nose    S/P MOHS   Biliary acute pancreatitis    CAD (coronary artery disease)    a. s/p MI in 1994 with PCI to LAD at that time b. cath 10/2012 demonstrated EF 30%, inferior akinesis with mild hypokinesis of all walls, patent LAD and RCA stents; ostial PDA with 80-90% obstruction with medical therapy recommended    Chronic systolic CHF (congestive heart failure) (HCC)    EF 30 to 35 % as of 09/2014.    CKD (chronic kidney disease), stage III ([place])    Complication of anesthesia 10/2014   "had to have defibrillator w/ERCP"   COPD (chronic obstructive pulmonary disease) ([place])    a. followed by pulmonary, COPD GOLD stage II   Depression    Diverticulosis of colon 07/2014   noted on CT   GERD (gastroesophageal reflux disease)    Hiatal hernia    Hyperglycemia 10/2012.   Hyperlipidemia    Hypertension    Myocardial infarction [facility]) 1994; 2011   Pneumonia 1946; 2015   Prostate enlargement 07/2014   observed on CT   Tobacco abuse    Ventricular tachycardia ([place])    a. 08/2009 s/p BSX E110 Teligen 100 AICD, ser#: [id number];  b. 08/2008 VT req ATP - detection reprogrammed from 160 to 150. c. EPS and VT ablation  by Dr. Lovena Le 12/21/2014   Past Surgical History:  Procedure Laterality Date   BIOPSY  12/21/2017   Procedure: BIOPSY;  Surgeon: [name], MD;  Location: [facility];  Service: Endoscopy;;   CATARACT EXTRACTION W/ INTRAOCULAR LENS  IMPLANT, BILATERAL Bilateral ~ 2011   COLONOSCOPY     COLONOSCOPY WITH PROPOFOL N/A 12/21/2017   Procedure: COLONOSCOPY WITH PROPOFOL;  Surgeon: [name], MD;  Location: [facility];  Service: Endoscopy;  Laterality: N/A;   ELECTROPHYSIOLOGIC STUDY N/A 12/21/2014   Procedure: V Tach Ablation;  Surgeon: [name], MD;  Location: [place] [facility];  Service: Cardiovascular;  Laterality: N/A;   ERCP N/A 11/16/2014   Procedure: ENDOSCOPIC RETROGRADE CHOLANGIOPANCREATOGRAPHY (ERCP);  Surgeon: [name], MD;  Location: [place];  Service: Endoscopy;  Laterality: N/A;   ESOPHAGOGASTRODUODENOSCOPY (EGD) WITH PROPOFOL N/A 12/21/2017   Procedure: ESOPHAGOGASTRODUODENOSCOPY (EGD) WITH PROPOFOL;  Surgeon: [name], MD;  [facility];  Service: Endoscopy;  Laterality: N/A;   EYE SURGERY     FOOT SURGERY Left 2005   "fixed bone that stuck out in my ankle area"   HEMORRHOID BANDING     IMPLANTABLE CARDIOVERTER DEFIBRILLATOR IMPLANT  09/06/09   BSX dual chamber ICD implanted in [place] for cardiac arrest  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   [place] Right ~ [place] N/A 11/25/2012   demonstrated EF 30%, inferior akinesis with mild hypokinesis of all walls, patent LAD and RCA stents; ostial PDA with 80-90% obstruction with medical therapy recommended   MOHS SURGERY  2008   nose, skin graft   POLYPECTOMY  12/21/2017   Procedure: POLYPECTOMY;  Surgeon: [name], MD;  [facility]: [facility];  Service: Endoscopy;;   RETINAL DETACHMENT SURGERY Right 2013   TENOLYSIS Right 12/21/2013   Procedure: TENOLYSIS FLEXOR CARPI RADIALIS ,DEBRIDEMENT RIGHT JOINT WRIST,DEBRIDEMENT SCAPHOTRAPEZIAL  TRAPEZOID, REPAIR OF EXTENSOR HOOD;  Surgeon: Daryll Brod, MD;  Location: [place];  Service: Orthopedics;  Laterality: Right;   TOE SURGERY Right 09/2019   3rd toe/hammer toe   V-TACH ABLATION  12/21/2014   VIDEO BRONCHOSCOPY Bilateral 01/09/2016   Procedure: VIDEO BRONCHOSCOPY WITHOUT FLUORO;  Surgeon: Juanito Doom, MD;  [facility];  Service: Cardiopulmonary;  Laterality: Bilateral;    Allergies  Allergies  Allergen Reactions   Sulfa Antibiotics Hives    History of Present Illness    Steven Guzman  is a 81 year old male with the above mention past medical history who presents today for complaint of chest pain.  He was last seen by Dr. Tamala Julian on 08/2021.  During visit patient had reported a recent fall at home however did not strike his head.  He was euvolemic on examination and endorses no palpitations or angina.  Most recent 2D echo was completed 03/2021 with EF of 40-45%, mild decreased LV function, no RWMA, inferior and inferior lateral akinesis anterolateral hypokinesis with mildly dilated LV, grade 1 DD with normal RV function moderately dilated LA, mildly dilated RA with normal valve function.  Patient had surveillance CT angiography for AAA on 04/2021 showing 4.3 cm maximum diameter.  Patient is currently not on ASA due to bleeding risk.  Steven Guzman presents today for follow-up alone.  Since last being seen in the office patient reports that he is experienced right shoulder and back pain that has occurred last 3 weeks ago during his sleep.  He states that the pain woke him up from sleeping and was relieved with nitroglycerin.  He denies any chest pain or angina with activities.  He does endorse smoking 1 to 2 cigarettes/day.  He he notes that the discomfort started once he began to smoke again.  He is now currently not smoking and states that his pain is much better.  He is compliant with his current medications and blood pressure today was 100/62 and heart  rate of 65.  He states at home his blood pressures are in the 938B systolically.  We discussed the importance of nicotine and vasoconstrictive properties leading to possible angina.  Patient denies chest pain, palpitations, dyspnea, PND, orthopnea, nausea, vomiting, dizziness, syncope, edema, weight gain, or early satiety.  Home Medications    Current Outpatient Medications  Medication Sig Dispense Refill   albuterol (PROVENTIL) (2.5 MG/3ML) 0.083% nebulizer solution USE 1 VIAL VIA NEBULIZER EVERY 6 HOURS AS NEEDED FOR WHEEZING OR SHORTNESS OF BREATH 360 mL 0   amiodarone (PACERONE) 200 MG tablet TAKE ONE TABLET BY MOUTH DAILY 30 tablet 6   apixaban (ELIQUIS) 5 MG TABS tablet Take 1 tablet (5 mg total) by mouth 2 (two) times daily. 180 tablet 1   Artificial Tear Solution (SOOTHE XP OP) Place 2 drops into both eyes daily as  needed (dry eyes).     benazepril (LOTENSIN) 10 MG tablet Take 0.5 mg by mouth daily.     buPROPion (WELLBUTRIN) 75 MG tablet TAKE ONE TABLET BY MOUTH DAILY 90 tablet 2   busPIRone (BUSPAR) 15 MG tablet Take 15 mg by mouth 3 (three) times daily. Patient taking 10 mg by mouth twice daily     carvedilol (COREG) 3.125 MG tablet Take 1 tablet (3.125 mg total) by mouth 2 (two) times daily. 60 tablet 8   cetirizine (ZYRTEC) 10 MG tablet Take 1 tablet by mouth daily.     Cyanocobalamin (VITAMIN B-12) 5000 MCG SUBL Place under the tongue daily.     escitalopram (LEXAPRO) 20 MG tablet Take 20 mg by mouth daily. Take 1/2 tablet (10 mg total ) by mouth daily     fluticasone (FLONASE) 50 MCG/ACT nasal spray Place 2 sprays into both nostrils daily. 16 g 6   furosemide (LASIX) 40 MG tablet Take 0.5 tablets (20 mg total) by mouth daily. 30 tablet 3   gabapentin (NEURONTIN) 300 MG capsule Take 400 mg by mouth 2 (two) times daily. Pt take 1 tablet 300 mg at night and 100 mg in the morning.     guaiFENesin (MUCINEX) 600 MG 12 hr tablet Take 1 tablet by mouth 2 (two) times daily.      Ipratropium-Albuterol (COMBIVENT RESPIMAT) 20-100 MCG/ACT AERS respimat Inhale 1 puff into the lungs every 6 (six) hours. Shortness of breath or wheezing 4 g 5   IRON PO Take by mouth daily.      MAGNESIUM-OXIDE 400 (241.3 Mg) MG tablet TAKE 1 TABSULE BY MOUTH DAILY 90 tablet 3   mexiletine (MEXITIL) 200 MG capsule Take 1 capsule (200 mg total) by mouth 2 (two) times daily. 180 capsule 3   Multiple Vitamins-Minerals (CENTRUM ADULTS PO) Take by mouth daily.     mupirocin ointment (BACTROBAN) 2 % Apply 1 application. topically 2 (two) times daily. Apply to lower leg wounds daily prn 22 g 1   nitroGLYCERIN (NITROSTAT) 0.4 MG SL tablet Place 1 tablet (0.4 mg total) under the tongue every 5 (five) minutes as needed for chest pain. DISSOLVE 1 TABLET UNDER THE TONGUE EVERY 5 MINUTES FOR 3 DOSES 25 tablet 11   omeprazole (PRILOSEC) 20 MG capsule Take 20 mg by mouth daily.      potassium chloride SA (KLOR-CON M) 20 MEQ tablet Take 20 mEq by mouth once.     Respiratory Therapy Supplies (FLUTTER) DEVI Use as directed 1 each 0   rosuvastatin (CRESTOR) 40 MG tablet Take 1 tablet (40 mg total) by mouth daily. 90 tablet 3   Sennosides-Docusate Sodium (STOOL SOFTENER/LAXATIVE PO) Take 2 tablets by mouth daily.     Spacer/Aero-Holding Chambers (OPTICHAMBER DIAMOND) MISC optichamber VHC 1 each 0   STIOLTO RESPIMAT 2.5-2.5 MCG/ACT AERS Inhale 2 puffs into the lungs daily.     tamsulosin (FLOMAX) 0.4 MG CAPS capsule TAKE ONE CAPSULE BY MOUTH DAILY AFTER SUPPER 90 capsule 3   No current facility-administered medications for this visit.     Review of Systems  Please see the history of present illness.    (+) Shoulder pain (+) Chest tightness  All other systems reviewed and are otherwise negative except as noted above.  Physical Exam    Wt Readings from Last 3 Encounters:  12/19/21 202 lb 3.2 oz (91.7 kg)  12/11/21 197 lb 12.8 oz (89.7 kg)  11/13/21 199 lb 6.4 oz (90.4 kg)   VS: Vitals:  12/19/21 0806   BP: 100/62  Pulse: 65  SpO2: 96%  ,Body mass index is 27.42 kg/m.  Constitutional:      Appearance: Healthy appearance. Not in distress.  Neck:     Vascular: JVD normal.  Pulmonary:     Effort: Pulmonary effort is normal.     Breath sounds: No wheezing. No rales. Diminished in the bases Cardiovascular:     Normal rate. Regular rhythm. Normal S1. Normal S2.      Murmurs: There is no murmur.  Edema:    Peripheral edema absent.  Abdominal:     Palpations: Abdomen is soft non tender. There is no hepatomegaly.  Skin:    General: Skin is warm and dry.  Neurological:     General: No focal deficit present.     Mental Status: Alert and oriented to person, place and time.     Cranial Nerves: Cranial nerves are intact.  EKG/LABS/Other Studies Reviewed    ECG personally reviewed by me today -sinus rhythm with right axis deviation and first-degree AV block with no acute changes compared to previous EKG and rate of 65 bpm  Risk Assessment/Calculations:    CHA2DS2-VASc Score = 5   This indicates a 7.2% annual risk of stroke. The patient's score is based upon: CHF History: 1 HTN History: 1 Diabetes History: 0 Stroke History: 0 Vascular Disease History: 1 Age Score: 2 Gender Score: 0           Lab Results  Component Value Date   WBC 4.6 09/18/2021   HGB 13.3 09/18/2021   HCT 39.1 09/18/2021   MCV 101.8 (H) 09/18/2021   PLT 132 (L) 09/18/2021   Lab Results  Component Value Date   CREATININE 0.91 09/18/2021   BUN 11 09/18/2021   NA 139 09/18/2021   K 4.4 09/18/2021   CL 103 09/18/2021   CO2 27 09/18/2021   Lab Results  Component Value Date   ALT 27 09/18/2021   AST 26 09/18/2021   ALKPHOS 54 07/14/2021   BILITOT 0.8 09/18/2021   Lab Results  Component Value Date   CHOL 117 07/14/2021   HDL 47 07/14/2021   LDLCALC 53 07/14/2021   TRIG 88 07/14/2021   CHOLHDL 2.5 07/14/2021    Lab Results  Component Value Date   HGBA1C 5.6 12/29/2016    Assessment &  Plan    1.  Stable angina: -Today patient reports that his pain has not reoccurred in the last few weeks.  He reports that he started smoking due to increased life stressors and noticed that his pain started at that time. -He is compliant with his current medications and was advised to take nitroglycerin and seek help in the ED if pain is not relieved after 2 nitroglycerin and is increased in intensity.   2.  Coronary artery disease: -s/p MI in 1994 with PCI to LAD left heart cath 10/2012 showing mild hypokinesis and patent LAD and RCA stent with 80-90% PDA medically treated  -Today patient reports no angina or chest discomfort -Continue GDMT with carvedilol 3.125 mg twice daily, rosuvastatin 40 mg daily   3.  Ischemic cardiomyopathy/history of VT: -Most recent 2D echo with EF of 40-45% -AICD placed in 2011 due to cardiac arrest from VT -Continue amiodarone 200 mg -Continue Mexitil 20 mg twice daily Continue Eliquis 5 mg twice daily -Device currently followed by EP  4.  HFrEF: -2D echo 03/2021 with EF of 40-45%, mildly decreased LV, RWMA with mid inferior and  inferior lateral akinesis -Patient is euvolemic on examination today -Continue GDMT with carvedilol, Lasix 20 mg, Lotensin -Uptitration of GDMT inhibited by chronically low BP Low sodium diet, fluid restriction <2L, and daily weights encouraged. Educated to contact our office for weight gain of 2 lbs overnight or 5 lbs in one week.   5.  Abdominal aortic aneurysm: -CT angio of the chest completed 04/2021 for surveillance revealed mild aneurysmal dilation with scattered atherosclerotic calcifications and no evidence of dissection -Continue GDMT and blood pressure control  6.  Paroxysmal atrial fibrillation: -Patient today is sinus rhythm with first-degree AV block with no ectopy. -He reports no occult bleeding with Eliquis -Continue Eliquis 5 mg twice daily -Eliquis dose appropriate based on age and creatinine of  1.27 -CHA2DS2-VASc Score = 5 [CHF History: 1, HTN History: 1, Diabetes History: 0, Stroke History: 0, Vascular Disease History: 1, Age Score: 2, Gender Score: 0].  Therefore, the patient's annual risk of stroke is 7.2 %.      Disposition: Follow-up with Sanda Klein, MD or APP in 6 months   Medication Adjustments/Labs and Tests Ordered: Current medicines are reviewed at length with the patient today.  Concerns regarding medicines are outlined above.   Signed, Mable Fill, Marissa Nestle, NP 12/19/2021, 8:54 AM [place]

## 2021-12-16 NOTE — Therapy (Signed)
OUTPATIENT PHYSICAL THERAPY TREATMENT     Patient Name: Steven Guzman MRN: [id number] DOB:May 11, 1940, 81 y.o., male Today's Date: 12/16/2021  PCP: [name] NP REFERRING PROVIDER: [name] NP   PT End of Session - 12/16/21 1403     Visit Number 5    Number of Visits 12    Date for PT Re-Evaluation 01/14/22    Authorization Type [employer]    Authorization - Visit Number 5    Progress Note Due on Visit 10    PT Start Time 8563    PT Stop Time 1445    PT Time Calculation (min) 42 min    Activity Tolerance Patient tolerated treatment well    Behavior During Therapy WFL for tasks assessed/performed               Past Medical History:  Diagnosis Date   AICD (automatic cardioverter/defibrillator) present    Aneurysm ([place])    a. Aneurysmal infrarenal aorta up to 33 mm on CT 10/2014, recommended f/u due 10/2017   Anginal pain ([place])    Anxiety    Basal cell carcinoma of nose    S/P MOHS   Biliary acute pancreatitis    CAD (coronary artery disease)    a. s/p MI in 1994 with PCI to LAD at that time b. cath 10/2012 demonstrated EF 30%, inferior akinesis with mild hypokinesis of all walls, patent LAD and RCA stents; ostial PDA with 80-90% obstruction with medical therapy recommended    Chronic systolic CHF (congestive heart failure) (HCC)    EF 30 to 35 % as of 09/2014.    CKD (chronic kidney disease), stage III ([place])    Complication of anesthesia 10/2014   "had to have defibrillator w/ERCP"   COPD (chronic obstructive pulmonary disease) ([place])    a. followed by pulmonary, COPD GOLD stage II   Depression    Diverticulosis of colon 07/2014   noted on CT   GERD (gastroesophageal reflux disease)    Hiatal hernia    Hyperglycemia 10/2012.   Hyperlipidemia    Hypertension    Myocardial infarction [facility]) 1994; 2011   Pneumonia 1946; 2015   Prostate enlargement 07/2014   observed on CT   Tobacco abuse    Ventricular tachycardia ([place])    a. 08/2009 s/p BSX E110 Teligen  100 AICD, ser#: [id number];  b. 08/2008 VT req ATP - detection reprogrammed from 160 to 150. c. EPS and VT ablation by Dr. Lovena Le 12/21/2014   Past Surgical History:  Procedure Laterality Date   BIOPSY  12/21/2017   Procedure: BIOPSY;  Surgeon: [name], MD;  Location: [facility];  Service: Endoscopy;;   CATARACT EXTRACTION W/ INTRAOCULAR LENS  IMPLANT, BILATERAL Bilateral ~ 2011   COLONOSCOPY     COLONOSCOPY WITH PROPOFOL N/A 12/21/2017   Procedure: COLONOSCOPY WITH PROPOFOL;  Surgeon: [name], MD;  Location: [facility];  Service: Endoscopy;  Laterality: N/A;   ELECTROPHYSIOLOGIC STUDY N/A 12/21/2014   Procedure: V Tach Ablation;  Surgeon: [name], MD;  Location: [place] [facility];  Service: Cardiovascular;  Laterality: N/A;   ERCP N/A 11/16/2014   Procedure: ENDOSCOPIC RETROGRADE CHOLANGIOPANCREATOGRAPHY (ERCP);  Surgeon: [name], MD;  Location: [place];  Service: Endoscopy;  Laterality: N/A;   ESOPHAGOGASTRODUODENOSCOPY (EGD) WITH PROPOFOL N/A 12/21/2017   Procedure: ESOPHAGOGASTRODUODENOSCOPY (EGD) WITH PROPOFOL;  Surgeon: [name], MD;  [facility]: [facility];  Service: Endoscopy;  Laterality: N/A;   EYE SURGERY  FOOT SURGERY Left 2005   "fixed bone that stuck out in my ankle area"   HEMORRHOID BANDING     IMPLANTABLE CARDIOVERTER DEFIBRILLATOR IMPLANT  09/06/09   BSX dual chamber ICD implanted in [place] for cardiac arrest 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   [place] Right ~ [place] N/A 11/25/2012   demonstrated EF 30%, inferior akinesis with mild hypokinesis of all walls, patent LAD and RCA stents; ostial PDA with 80-90% obstruction with medical therapy recommended   MOHS SURGERY  2008   nose, skin graft   POLYPECTOMY  12/21/2017   Procedure: POLYPECTOMY;  Surgeon: [name], MD;  [facility]: [facility];  Service: Endoscopy;;   RETINAL DETACHMENT SURGERY Right 2013   TENOLYSIS Right 12/21/2013    Procedure: TENOLYSIS FLEXOR CARPI RADIALIS ,DEBRIDEMENT RIGHT JOINT WRIST,DEBRIDEMENT SCAPHOTRAPEZIAL TRAPEZOID, REPAIR OF EXTENSOR HOOD;  Surgeon: Daryll Brod, MD;  Location: [place];  Service: Orthopedics;  Laterality: Right;   TOE SURGERY Right 09/2019   3rd toe/hammer toe   V-TACH ABLATION  12/21/2014   VIDEO BRONCHOSCOPY Bilateral 01/09/2016   Procedure: VIDEO BRONCHOSCOPY WITHOUT FLUORO;  Surgeon: Juanito Doom, MD;  [facility];  Service: Cardiopulmonary;  Laterality: Bilateral;   Patient Active Problem List   Diagnosis Date Noted   Major depressive disorder with single episode, in partial remission ([place]) 03/05/2020   Paraseptal emphysema ([place]) 12/15/2018   Tobacco abuse 12/15/2018   Perianal abscess 11/15/2018   Left inguinal hernia 11/15/2018   Leg wound, right 11/15/2018   Weight loss 09/29/2018   Dark stools 09/29/2018   Lumbar trigger point syndrome 04/20/2018   Throat and mouth symptom 02/03/2018   Gastroesophageal reflux disease without esophagitis    Esophageal stricture    Gastritis and gastroduodenitis    Degenerative cervical spinal stenosis 10/27/2017   Carotid bruit 10/27/2017   B12 deficiency 08/25/2017   Anemia 06/10/2017   Right ankle pain 04/09/2017   BPH (benign prostatic hyperplasia) 12/31/2016   Dizzinesses 05/22/2016   Mass of throat 06/30/2015   Depression 05/24/2015   Atrial fibrillation ([place]) 03/15/2015   Routine general medical examination at a health care facility 05/12/2014   Hemoptysis 03/16/2014   COPD GOLD GRADE C 18/29/9371   Chronic systolic heart failure ([place]) 07/07/2013   CAD (coronary artery disease) 11/23/2012   Smokers' cough ([place]) 11/23/2012   Ventricular tachycardia ([place]) 11/23/2012   Essential hypertension 11/23/2012   Hyperlipidemia 11/23/2012   ICD (implantable cardioverter-defibrillator) in place 11/23/2012    ONSET DATE: ~6-8 months ago  REFERRING DIAG: Frequent falls  THERAPY DIAG:   Cervicalgia  Abnormal posture  Muscle weakness (generalized)  Other abnormalities of gait and mobility  Rationale for Evaluation and Treatment Rehabilitation  SUBJECTIVE:   SUBJECTIVE STATEMENT: Pt states that he has not felt too different. Has not been consistent with his exercises. Pt reports he has been busy with his church.   (From eval) Pt reports history of vertigo but mostly complains of feelings of soreness in head/neck (ongoing 6-8 months). Feels this may be related to his balance/falls. Did not have to use cane in the house but has needed to because of back pain (this has been chronic but worse lately).  Pt accompanied by: self  PERTINENT HISTORY: Vertigo, 2 dislocated shoulders, back pain since 2012   PAIN:  Are you having pain? 0/10  PRECAUTIONS: Fall  WEIGHT BEARING RESTRICTIONS No  FALLS: Has patient fallen in last 6 months? Yes.  Number of falls 1  LIVING ENVIRONMENT: Lives with: lives with their family and lives with their spouse Lives in: House/apartment Has following equipment at home: Single point cane  PLOF: Independent  PATIENT GOALS Improve neck pain so he doesn't have to sleep on a heating pad  OBJECTIVE:    Cervical ROM:    Active A/PROM (deg) eval 12/12/21  Flexion 25 55  Extension 25 (sore pain) 20 - no pain  Right lateral flexion 24 25  Left lateral flexion 18 30  Right rotation 30   Left rotation 35   (Blank rows = not tested)  STRENGTH:   UPPER EXTREMITY MMT:  MMT Right eval Left eval  Shoulder flexion 5 5  Shoulder extension 5 5  Shoulder abduction 5 5  Shoulder adduction    Shoulder internal rotation 5 5  Shoulder external rotation 3+ 3+  Middle trapezius 3+ 3+  Lower trapezius 3+ 3+  Elbow flexion    Elbow extension    Wrist flexion    Wrist extension    Wrist ulnar deviation    Wrist radial deviation    Wrist pronation    Wrist supination    Grip strength     (Blank rows = not tested)   LOWER EXTREMITY  MMT:   MMT Right eval Left eval  Hip flexion 5 5  Hip abduction    Hip adduction    Hip internal rotation    Hip external rotation    Knee flexion 4+ 5  Knee extension 5 5  Ankle dorsiflexion    Ankle plantarflexion    Ankle inversion    Ankle eversion    (Blank rows = not tested)   GAIT: Gait pattern: step through pattern, Right foot flat, Left foot flat, knee flexed in stance- Right, knee flexed in stance- Left, and trunk flexed Distance walked: 100' Assistive device utilized: Single point cane Level of assistance: Modified independence Comments: n/a  FUNCTIONAL TESTs:  Dynamic Gait Index: 17   TODAY'S TREATMENT  12/16/21  UBE L3 X 4 min alt fwd/bwd   Standing  Doorway pec stretch mid and high 2x30 sec  Row green TB 2x10  Bow and arrow green TB 2x10   With pool noodle behind back:  Cervical retraction 3x10 sec  Scap squeeze 3x10 sec  Shoulder ER yellow TB 2x10  "W" yellow TB 2x10  Horizontal shoulder abd yellow TB 2x10    Manual therapy  STM and TPR cervical paraspinals and bilat UT  Manual traction and cervical PROM all directions   12/12/21  UBE L3 x 4 min alt fwd/bkwd    Doorway stretch mid and high 2 x 30 sec    With pool noodle behind back:  L x 20  W x 20  Scap squeeze x 20   Row green TB x 20  Shoulder extension x 20 green TB  Cervical retraction x 15 then x 10 with towel for resistance and to improve form  Cervical extension stretch with towel behind neck 10 x 10 sec  Upper trap stretch bilat 2 x 30 sec   Manual:  STM and TPR cervical paraspinals and bilat UT  Manual traction and cervical PROM all directions     12/10/21  UBE L2 x 4 min alt fwd/bkwd    Doorway stretch mid and high 2 x 30 sec    With pool noodle behind back:  Scap squeeze 2 x 10  L's 2 x 10  W 2 x 10  Chin tuck x 10   Balance:  Semi tandem stance on ground 2 x 30 sec bilat  SLS Lt 5 sec, Rt 3 sec  Rocker board x 1 min each A/P and laterally  Standing on foam  head turns horizontally and vertically 2 x 30 sec each  Gait backwards and with head turns horizontally and A/P with CGA   PATIENT EDUCATION: Education details: Exam findings, POC, TPDN Person educated: Patient Education method: Explanation, Demonstration, and Handouts Education comprehension: verbalized understanding and returned demonstration  Access Code: [email or web address] URL: [email or web address] Date: 12/04/2021 Prepared by: Estill Bamberg April Thurnell Garbe  Exercises - Doorway Pec Stretch at 60 Elevation  - 1 x daily - 7 x weekly - 2 sets - 30 sec hold - Doorway Pec Stretch at 90 Degrees Abduction  - 1 x daily - 7 x weekly - 2 sets - 30 sec hold - Standing Scapular Retraction  - 1 x daily - 7 x weekly - 2 sets - 10 reps - Cervical Retraction at Wall  - 1 x daily - 7 x weekly - 1 sets - 10 reps - Shoulder External Rotation and Scapular Retraction  - 1 x daily - 7 x weekly - 2 sets - 10 reps - Shoulder External Rotation in 45 Degrees Abduction  - 1 x daily - 7 x weekly - 2 sets - 10 reps  GOALS: Goals reviewed with patient? Yes  SHORT TERM GOALS: Target date: 12/24/2021   Pt will be ind with initial HEP Baseline: Goal status: INITIAL  2.  Pt will report at least 25% decrease in neck/head pain Baseline:  Goal status: INITIAL   LONG TERM GOALS: Target date: 01/14/2022    Pt will be ind with final HEP and transition to community wellness Baseline:  Goal status: INITIAL  2.  Pt will report no longer needing heating pad to sleep per his goal Baseline:  Goal status: INITIAL  3.  Pt will demo at least 20/24 DGI to demo decreased risk of falls Baseline:  Goal status: INITIAL  4.  Pt will demo at least 5-10 deg improvement in C-spine ROM for driving tasks Baseline:  Goal status: INITIAL  5.  Pt will demo at least 4/5 strength in posterior shoulder girdle to demo improved postural stability for reduced neck/back pain with amb Baseline:  Goal status:  INITIAL   ASSESSMENT:  CLINICAL IMPRESSION: 9/19: Continued to work on   From eval: Patient is an 81 y.o. M who was seen today for physical therapy evaluation and treatment for neck/head pain with referral for falls and unstable gait. PMH significant for 1 recent fall, chronic back pain and history of BPPV likely adding to his current deficits. Assessment significant for poor postural stability with decreased posterior shoulder girdle strength, reduced cervical and thoracic ROM, multiple cervical muscle trigger points all leading towards reduced dynamic balance and pain affecting community and home activities. Pt would benefit from PT to address these issues.     PLAN: PT FREQUENCY: 2x/week  PT DURATION: 6 weeks  PLANNED INTERVENTIONS: Therapeutic exercises, Therapeutic activity, Neuromuscular re-education, Balance training, Gait training, Patient/Family education, Self Care, Joint mobilization, Vestibular training, Dry Needling, Spinal mobilization, Cryotherapy, Moist heat, Taping, Traction, Ionotophoresis '4mg'$ /ml Dexamethasone, Manual therapy, and Re-evaluation  PLAN FOR NEXT SESSION: Assess mCTSIB. Initiate manual therapy/TPDN for C-spine. Continue postural strengthening and balance.    Ruhee Enck April Ma L Dashawna Delbridge, PT, DPT 12/16/2021, 2:03 PM

## 2021-12-18 ENCOUNTER — Encounter: Payer: Self-pay | Admitting: Physical Therapy

## 2021-12-18 ENCOUNTER — Ambulatory Visit: Payer: Medicare Other | Admitting: Physical Therapy

## 2021-12-18 DIAGNOSIS — M542 Cervicalgia: Secondary | ICD-10-CM | POA: Diagnosis not present

## 2021-12-18 DIAGNOSIS — M6281 Muscle weakness (generalized): Secondary | ICD-10-CM

## 2021-12-18 DIAGNOSIS — R293 Abnormal posture: Secondary | ICD-10-CM

## 2021-12-18 DIAGNOSIS — R2689 Other abnormalities of gait and mobility: Secondary | ICD-10-CM

## 2021-12-18 NOTE — Therapy (Signed)
OUTPATIENT PHYSICAL THERAPY TREATMENT     Patient Name: Steven Guzman MRN: [id number] DOB:1940-05-13, 81 y.o., male Today's Date: 12/18/2021  PCP: [name] NP REFERRING PROVIDER: [name] NP   PT End of Session - 12/18/21 1320     Visit Number 6    Number of Visits 12    Date for PT Re-Evaluation 01/14/22    Authorization Type [employer]    Authorization - Visit Number 6    Progress Note Due on Visit 10    PT Start Time 1318    PT Stop Time 1400    PT Time Calculation (min) 42 min    Activity Tolerance Patient tolerated treatment well    Behavior During Therapy WFL for tasks assessed/performed               Past Medical History:  Diagnosis Date   AICD (automatic cardioverter/defibrillator) present    Aneurysm ([place])    a. Aneurysmal infrarenal aorta up to 33 mm on CT 10/2014, recommended f/u due 10/2017   Anginal pain ([place])    Anxiety    Basal cell carcinoma of nose    S/P MOHS   Biliary acute pancreatitis    CAD (coronary artery disease)    a. s/p MI in 1994 with PCI to LAD at that time b. cath 10/2012 demonstrated EF 30%, inferior akinesis with mild hypokinesis of all walls, patent LAD and RCA stents; ostial PDA with 80-90% obstruction with medical therapy recommended    Chronic systolic CHF (congestive heart failure) (HCC)    EF 30 to 35 % as of 09/2014.    CKD (chronic kidney disease), stage III ([place])    Complication of anesthesia 10/2014   "had to have defibrillator w/ERCP"   COPD (chronic obstructive pulmonary disease) ([place])    a. followed by pulmonary, COPD GOLD stage II   Depression    Diverticulosis of colon 07/2014   noted on CT   GERD (gastroesophageal reflux disease)    Hiatal hernia    Hyperglycemia 10/2012.   Hyperlipidemia    Hypertension    Myocardial infarction [facility]) 1994; 2011   Pneumonia 1946; 2015   Prostate enlargement 07/2014   observed on CT   Tobacco abuse    Ventricular tachycardia ([place])    a. 08/2009 s/p BSX E110 Teligen  100 AICD, ser#: [id number];  b. 08/2008 VT req ATP - detection reprogrammed from 160 to 150. c. EPS and VT ablation by Dr. Lovena Le 12/21/2014   Past Surgical History:  Procedure Laterality Date   BIOPSY  12/21/2017   Procedure: BIOPSY;  Surgeon: [name], MD;  Location: [facility];  Service: Endoscopy;;   CATARACT EXTRACTION W/ INTRAOCULAR LENS  IMPLANT, BILATERAL Bilateral ~ 2011   COLONOSCOPY     COLONOSCOPY WITH PROPOFOL N/A 12/21/2017   Procedure: COLONOSCOPY WITH PROPOFOL;  Surgeon: [name], MD;  Location: [facility];  Service: Endoscopy;  Laterality: N/A;   ELECTROPHYSIOLOGIC STUDY N/A 12/21/2014   Procedure: V Tach Ablation;  Surgeon: [name], MD;  Location: [place] [facility];  Service: Cardiovascular;  Laterality: N/A;   ERCP N/A 11/16/2014   Procedure: ENDOSCOPIC RETROGRADE CHOLANGIOPANCREATOGRAPHY (ERCP);  Surgeon: [name], MD;  Location: [place];  Service: Endoscopy;  Laterality: N/A;   ESOPHAGOGASTRODUODENOSCOPY (EGD) WITH PROPOFOL N/A 12/21/2017   Procedure: ESOPHAGOGASTRODUODENOSCOPY (EGD) WITH PROPOFOL;  Surgeon: [name], MD;  Location: [facility];  Service: Endoscopy;  Laterality: N/A;   EYE SURGERY  FOOT SURGERY Left 2005   "fixed bone that stuck out in my ankle area"   HEMORRHOID BANDING     IMPLANTABLE CARDIOVERTER DEFIBRILLATOR IMPLANT  09/06/09   BSX dual chamber ICD implanted in [place] for cardiac arrest 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   [place] Right ~ [place] N/A 11/25/2012   demonstrated EF 30%, inferior akinesis with mild hypokinesis of all walls, patent LAD and RCA stents; ostial PDA with 80-90% obstruction with medical therapy recommended   MOHS SURGERY  2008   nose, skin graft   POLYPECTOMY  12/21/2017   Procedure: POLYPECTOMY;  Surgeon: [name], MD;  Location: [facility];  Service: Endoscopy;;   RETINAL DETACHMENT SURGERY Right 2013   TENOLYSIS Right 12/21/2013    Procedure: TENOLYSIS FLEXOR CARPI RADIALIS ,DEBRIDEMENT RIGHT JOINT WRIST,DEBRIDEMENT SCAPHOTRAPEZIAL TRAPEZOID, REPAIR OF EXTENSOR HOOD;  Surgeon: Daryll Brod, MD;  Location: [place];  Service: Orthopedics;  Laterality: Right;   TOE SURGERY Right 09/2019   3rd toe/hammer toe   V-TACH ABLATION  12/21/2014   VIDEO BRONCHOSCOPY Bilateral 01/09/2016   Procedure: VIDEO BRONCHOSCOPY WITHOUT FLUORO;  Surgeon: Juanito Doom, MD;  Location: [facility];  Service: Cardiopulmonary;  Laterality: Bilateral;   Patient Active Problem List   Diagnosis Date Noted   Major depressive disorder with single episode, in partial remission ([place]) 03/05/2020   Paraseptal emphysema ([place]) 12/15/2018   Tobacco abuse 12/15/2018   Perianal abscess 11/15/2018   Left inguinal hernia 11/15/2018   Leg wound, right 11/15/2018   Weight loss 09/29/2018   Dark stools 09/29/2018   Lumbar trigger point syndrome 04/20/2018   Throat and mouth symptom 02/03/2018   Gastroesophageal reflux disease without esophagitis    Esophageal stricture    Gastritis and gastroduodenitis    Degenerative cervical spinal stenosis 10/27/2017   Carotid bruit 10/27/2017   B12 deficiency 08/25/2017   Anemia 06/10/2017   Right ankle pain 04/09/2017   BPH (benign prostatic hyperplasia) 12/31/2016   Dizzinesses 05/22/2016   Mass of throat 06/30/2015   Depression 05/24/2015   Atrial fibrillation ([place]) 03/15/2015   Routine general medical examination at a health care facility 05/12/2014   Hemoptysis 03/16/2014   COPD GOLD GRADE C 23/76/2831   Chronic systolic heart failure ([place]) 07/07/2013   CAD (coronary artery disease) 11/23/2012   Smokers' cough ([place]) 11/23/2012   Ventricular tachycardia ([place]) 11/23/2012   Essential hypertension 11/23/2012   Hyperlipidemia 11/23/2012   ICD (implantable cardioverter-defibrillator) in place 11/23/2012    ONSET DATE: ~6-8 months ago  REFERRING DIAG: Frequent falls  THERAPY DIAG:   Cervicalgia  Abnormal posture  Muscle weakness (generalized)  Other abnormalities of gait and mobility  Rationale for Evaluation and Treatment Rehabilitation  SUBJECTIVE:   SUBJECTIVE STATEMENT: 9/21: Pt reports he still has not been doing his exercises -- has been too busy at home.   (From eval) Pt reports history of vertigo but mostly complains of feelings of soreness in head/neck (ongoing 6-8 months). Feels this may be related to his balance/falls. Did not have to use cane in the house but has needed to because of back pain (this has been chronic but worse lately).  Pt accompanied by: self  PERTINENT HISTORY: Vertigo, 2 dislocated shoulders, back pain since 2012   PAIN:  Are you having pain? 0/10  PRECAUTIONS: Fall  WEIGHT BEARING RESTRICTIONS No  FALLS: Has patient fallen in last 6 months? Yes. Number of falls 1  LIVING ENVIRONMENT:  Lives with: lives with their family and lives with their spouse Lives in: House/apartment Has following equipment at home: Single point cane  PLOF: Independent  PATIENT GOALS Improve neck pain so he doesn't have to sleep on a heating pad  OBJECTIVE:    Cervical ROM:    Active A/PROM (deg) eval 12/12/21  Flexion 25 55  Extension 25 (sore pain) 20 - no pain  Right lateral flexion 24 25  Left lateral flexion 18 30  Right rotation 30   Left rotation 35   (Blank rows = not tested)  STRENGTH:   UPPER EXTREMITY MMT:  MMT Right eval Left eval  Shoulder flexion 5 5  Shoulder extension 5 5  Shoulder abduction 5 5  Shoulder adduction    Shoulder internal rotation 5 5  Shoulder external rotation 3+ 3+  Middle trapezius 3+ 3+  Lower trapezius 3+ 3+  Elbow flexion    Elbow extension    Wrist flexion    Wrist extension    Wrist ulnar deviation    Wrist radial deviation    Wrist pronation    Wrist supination    Grip strength     (Blank rows = not tested)   LOWER EXTREMITY MMT:   MMT Right eval Left eval  Hip  flexion 5 5  Hip abduction    Hip adduction    Hip internal rotation    Hip external rotation    Knee flexion 4+ 5  Knee extension 5 5  Ankle dorsiflexion    Ankle plantarflexion    Ankle inversion    Ankle eversion    (Blank rows = not tested)   GAIT: Gait pattern: step through pattern, Right foot flat, Left foot flat, knee flexed in stance- Right, knee flexed in stance- Left, and trunk flexed Distance walked: 100' Assistive device utilized: Single point cane Level of assistance: Modified independence Comments: n/a  FUNCTIONAL TESTs:  Dynamic Gait Index: 17   TODAY'S TREATMENT 12/18/21 THEREX  Sitting UBE L4 x 4 min alt fwd/bwd Cervical ext with towel 2x20 sec Cervical lateral flexion 2x20 sec R&L Cervical rotation with towel 2x20 sec Cervical rotation with retraction x10 Horizontal abd green TB 2x10 Low trap green TB 2x10    Standing  Doorway pec stretch mid and high 2x30 sec    Prone  "I", "W" 2x10  MANUAL THERAPY  STM and TPR cervical and thoracic paraspinals, bilat UT Cervical - Thoracic PA mobs grade II to III     12/16/21  UBE L3 X 4 min alt fwd/bwd   Standing  Doorway pec stretch mid and high 2x30 sec  Row green TB 2x10  Bow and arrow green TB 2x10   With pool noodle behind back:  Cervical retraction 3x10 sec  Scap squeeze 3x10 sec  Shoulder ER yellow TB 2x10  "W" yellow TB 2x10  Horizontal shoulder abd yellow TB 2x10    Manual therapy  STM and TPR cervical paraspinals and bilat UT  Manual traction and cervical PROM all directions   12/12/21  UBE L3 x 4 min alt fwd/bkwd    Doorway stretch mid and high 2 x 30 sec    With pool noodle behind back:  L x 20  W x 20  Scap squeeze x 20   Row green TB x 20  Shoulder extension x 20 green TB  Cervical retraction x 15 then x 10 with towel for resistance and to improve form  Cervical extension stretch with towel behind neck  10 x 10 sec  Upper trap stretch bilat 2 x 30 sec   Manual:  STM  and TPR cervical paraspinals and bilat UT  Manual traction and cervical PROM all directions     12/10/21  UBE L2 x 4 min alt fwd/bkwd    Doorway stretch mid and high 2 x 30 sec    With pool noodle behind back:  Scap squeeze 2 x 10  L's 2 x 10  W 2 x 10  Chin tuck x 10   Balance:  Semi tandem stance on ground 2 x 30 sec bilat  SLS Lt 5 sec, Rt 3 sec  Rocker board x 1 min each A/P and laterally  Standing on foam head turns horizontally and vertically 2 x 30 sec each  Gait backwards and with head turns horizontally and A/P with CGA   PATIENT EDUCATION: Education details: Exam findings, POC, TPDN Person educated: Patient Education method: Explanation, Demonstration, and Handouts Education comprehension: verbalized understanding and returned demonstration  Access Code: [email or web address] URL: [email or web address] Date: 12/04/2021 Prepared by: Estill Bamberg April Thurnell Garbe  Exercises - Doorway Pec Stretch at 60 Elevation  - 1 x daily - 7 x weekly - 2 sets - 30 sec hold - Doorway Pec Stretch at 90 Degrees Abduction  - 1 x daily - 7 x weekly - 2 sets - 30 sec hold - Standing Scapular Retraction  - 1 x daily - 7 x weekly - 2 sets - 10 reps - Cervical Retraction at Wall  - 1 x daily - 7 x weekly - 1 sets - 10 reps - Shoulder External Rotation and Scapular Retraction  - 1 x daily - 7 x weekly - 2 sets - 10 reps - Shoulder External Rotation in 45 Degrees Abduction  - 1 x daily - 7 x weekly - 2 sets - 10 reps  GOALS: Goals reviewed with patient? Yes  SHORT TERM GOALS: Target date: 12/24/2021   Pt will be ind with initial HEP Baseline: Goal status: INITIAL  2.  Pt will report at least 25% decrease in neck/head pain Baseline:  Goal status: INITIAL   LONG TERM GOALS: Target date: 01/14/2022    Pt will be ind with final HEP and transition to community wellness Baseline:  Goal status: INITIAL  2.  Pt will report no longer needing heating pad to sleep per his goal Baseline:   Goal status: INITIAL  3.  Pt will demo at least 20/24 DGI to demo decreased risk of falls Baseline:  Goal status: INITIAL  4.  Pt will demo at least 5-10 deg improvement in C-spine ROM for driving tasks Baseline:  Goal status: INITIAL  5.  Pt will demo at least 4/5 strength in posterior shoulder girdle to demo improved postural stability for reduced neck/back pain with amb Baseline:  Goal status: INITIAL   ASSESSMENT:  CLINICAL IMPRESSION: 9/21: Session focused on continuing to improve postural stability for standing. Increased resistance with good pt tolerance.   From eval: Patient is an 81 y.o. M who was seen today for physical therapy evaluation and treatment for neck/head pain with referral for falls and unstable gait. PMH significant for 1 recent fall, chronic back pain and history of BPPV likely adding to his current deficits. Assessment significant for poor postural stability with decreased posterior shoulder girdle strength, reduced cervical and thoracic ROM, multiple cervical muscle trigger points all leading towards reduced dynamic balance and pain affecting community and home activities. Pt would benefit from PT to  address these issues.     PLAN: PT FREQUENCY: 2x/week  PT DURATION: 6 weeks  PLANNED INTERVENTIONS: Therapeutic exercises, Therapeutic activity, Neuromuscular re-education, Balance training, Gait training, Patient/Family education, Self Care, Joint mobilization, Vestibular training, Dry Needling, Spinal mobilization, Cryotherapy, Moist heat, Taping, Traction, Ionotophoresis '4mg'$ /ml Dexamethasone, Manual therapy, and Re-evaluation  PLAN FOR NEXT SESSION: Assess mCTSIB. Initiate manual therapy/TPDN for C-spine. Continue postural strengthening and balance.    Destiney Sanabia April Ma L Muna Demers, PT, DPT 12/18/2021, 1:21 PM

## 2021-12-19 ENCOUNTER — Encounter: Payer: Self-pay | Admitting: Nurse Practitioner

## 2021-12-19 ENCOUNTER — Ambulatory Visit: Payer: Medicare Other | Attending: Nurse Practitioner | Admitting: Nurse Practitioner

## 2021-12-19 VITALS — BP 100/62 | HR 65 | Ht 72.0 in | Wt 202.2 lb

## 2021-12-19 DIAGNOSIS — I25810 Atherosclerosis of coronary artery bypass graft(s) without angina pectoris: Secondary | ICD-10-CM

## 2021-12-19 DIAGNOSIS — I472 Ventricular tachycardia, unspecified: Secondary | ICD-10-CM | POA: Diagnosis not present

## 2021-12-19 DIAGNOSIS — I208 Other forms of angina pectoris: Secondary | ICD-10-CM

## 2021-12-19 DIAGNOSIS — I5022 Chronic systolic (congestive) heart failure: Secondary | ICD-10-CM

## 2021-12-19 DIAGNOSIS — I714 Abdominal aortic aneurysm, without rupture, unspecified: Secondary | ICD-10-CM

## 2021-12-19 DIAGNOSIS — I480 Paroxysmal atrial fibrillation: Secondary | ICD-10-CM

## 2021-12-19 NOTE — Patient Instructions (Signed)
Medication Instructions:  Your physician recommends that you continue on your current medications as directed. Please refer to the Current Medication list given to you today.  *If you need a refill on your cardiac medications before your next appointment, please call your pharmacy*   Follow-Up: At [facility], you and your health needs are our priority.  As part of our continuing mission to provide you with exceptional heart care, we have created designated Provider Care Teams.  These Care Teams include your primary Cardiologist (physician) and Advanced Practice Providers (APPs -  Physician Assistants and Nurse Practitioners) who all work together to provide you with the care you need, when you need it.  Your next appointment:   6 month(s)  The format for your next appointment:   In Person  Provider:   Dr. Sallyanne Kuster   Other Instructions

## 2021-12-22 ENCOUNTER — Ambulatory Visit
Admission: EM | Admit: 2021-12-22 | Discharge: 2021-12-22 | Disposition: A | Discharge status: Home / Self Care | Payer: Medicare Other

## 2021-12-22 DIAGNOSIS — R1013 Epigastric pain: Secondary | ICD-10-CM

## 2021-12-22 DIAGNOSIS — K429 Umbilical hernia without obstruction or gangrene: Secondary | ICD-10-CM

## 2021-12-22 DIAGNOSIS — I714 Abdominal aortic aneurysm, without rupture, unspecified: Secondary | ICD-10-CM

## 2021-12-22 NOTE — Addendum Note (Signed)
Addended by: Briant Cedar on: 12/22/2021 12:07 PM   Modules accepted: Orders

## 2021-12-22 NOTE — ED Provider Notes (Signed)
Steven Guzman CARE    CSN: [id number] Arrival date & time: 12/22/21  1921      History   Chief Complaint Chief Complaint  Patient presents with   Abdominal Pain    HPI Steven Guzman is a 81 y.o. male.   HPI  This is a very pleasant 81 year-old gentleman with multiple medical problems including heart disease, vascular disease, lung disease, chronic kidney disease, hyperlipidemia, hyperglycemia, hypertension.  He states that he had acute abdominal pain today that was quite severe.  He points to his upper abdomen and epigastric region.  It happened just before arrival. He drank buttermilk and it went away.  He states he does not have a history of heartburn.  He states he does not usually have indigestion.  No nausea or vomiting.  No change in bowels.  No urinary symptoms.  No chest pain or pressure.  Palpitations or irregular heartbeat   Past Medical History:  Diagnosis Date   AICD (automatic cardioverter/defibrillator) present    Aneurysm ([place])    a. Aneurysmal infrarenal aorta up to 33 mm on CT 10/2014, recommended f/u due 10/2017   Anginal pain ([place])    Anxiety    Basal cell carcinoma of nose    S/P MOHS   Biliary acute pancreatitis    CAD (coronary artery disease)    a. s/p MI in 1994 with PCI to LAD at that time b. cath 10/2012 demonstrated EF 30%, inferior akinesis with mild hypokinesis of all walls, patent LAD and RCA stents; ostial PDA with 80-90% obstruction with medical therapy recommended    Chronic systolic CHF (congestive heart failure) (HCC)    EF 30 to 35 % as of 09/2014.    CKD (chronic kidney disease), stage III ([place])    Complication of anesthesia 10/2014   "had to have defibrillator w/ERCP"   COPD (chronic obstructive pulmonary disease) ([place])    a. followed by pulmonary, COPD GOLD stage II   Depression    Diverticulosis of colon 07/2014   noted on CT   GERD (gastroesophageal reflux disease)    Hiatal hernia    Hyperglycemia 10/2012.   Hyperlipidemia     Hypertension    Myocardial infarction [facility]) 1994; 2011   Pneumonia 1946; 2015   Prostate enlargement 07/2014   observed on CT   Tobacco abuse    Ventricular tachycardia ([place])    a. 08/2009 s/p BSX E110 Teligen 100 AICD, ser#: [id number];  b. 08/2008 VT req ATP - detection reprogrammed from 160 to 150. c. EPS and VT ablation by Dr. Lovena Le 12/21/2014    Patient Active Problem List   Diagnosis Date Noted   Major depressive disorder with single episode, in partial remission ([place]) 03/05/2020   Paraseptal emphysema ([place]) 12/15/2018   Tobacco abuse 12/15/2018   Perianal abscess 11/15/2018   Left inguinal hernia 11/15/2018   Leg wound, right 11/15/2018   Weight loss 09/29/2018   Dark stools 09/29/2018   Lumbar trigger point syndrome 04/20/2018   Throat and mouth symptom 02/03/2018   Gastroesophageal reflux disease without esophagitis    Esophageal stricture    Gastritis and gastroduodenitis    Degenerative cervical spinal stenosis 10/27/2017   Carotid bruit 10/27/2017   B12 deficiency 08/25/2017   Anemia 06/10/2017   Right ankle pain 04/09/2017   BPH (benign prostatic hyperplasia) 12/31/2016   Dizzinesses 05/22/2016   Mass of throat 06/30/2015   Depression 05/24/2015   Atrial fibrillation ([place]) 03/15/2015   Routine general medical examination at  a health care facility 05/12/2014   Hemoptysis 03/16/2014   COPD GOLD GRADE C 67/61/9509   Chronic systolic heart failure ([place]) 07/07/2013   CAD (coronary artery disease) 11/23/2012   Smokers' cough ([place]) 11/23/2012   Ventricular tachycardia ([place]) 11/23/2012   Essential hypertension 11/23/2012   Hyperlipidemia 11/23/2012   ICD (implantable cardioverter-defibrillator) in place 11/23/2012    Past Surgical History:  Procedure Laterality Date   BIOPSY  12/21/2017   Procedure: BIOPSY;  Surgeon: [name], MD;  Location: [facility];  Service: Endoscopy;;   CATARACT EXTRACTION W/ INTRAOCULAR LENS  IMPLANT, BILATERAL Bilateral ~ 2011    COLONOSCOPY     COLONOSCOPY WITH PROPOFOL N/A 12/21/2017   Procedure: COLONOSCOPY WITH PROPOFOL;  Surgeon: [name], MD;  Location: [facility];  Service: Endoscopy;  Laterality: N/A;   ELECTROPHYSIOLOGIC STUDY N/A 12/21/2014   Procedure: V Tach Ablation;  Surgeon: [name], MD;  Location: [facility];  Service: Cardiovascular;  Laterality: N/A;   ERCP N/A 11/16/2014   Procedure: ENDOSCOPIC RETROGRADE CHOLANGIOPANCREATOGRAPHY (ERCP);  Surgeon: [name], MD;  Location: [facility];  Service: Endoscopy;  Laterality: N/A;   ESOPHAGOGASTRODUODENOSCOPY (EGD) WITH PROPOFOL N/A 12/21/2017   Procedure: ESOPHAGOGASTRODUODENOSCOPY (EGD) WITH PROPOFOL;  Surgeon: [name], MD;  [facility];  Service: Endoscopy;  Laterality: N/A;   EYE SURGERY     FOOT SURGERY Left 2005   "fixed bone that stuck out in my ankle area"   HEMORRHOID BANDING     IMPLANTABLE CARDIOVERTER DEFIBRILLATOR IMPLANT  09/06/09   BSX dual chamber ICD implanted in [place] for cardiac arrest 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   [facility] Right ~ [place] N/A 11/25/2012   demonstrated EF 30%, inferior akinesis with mild hypokinesis of all walls, patent LAD and RCA stents; ostial PDA with 80-90% obstruction with medical therapy recommended   MOHS SURGERY  2008   nose, skin graft   POLYPECTOMY  12/21/2017   Procedure: POLYPECTOMY;  Surgeon: [name], MD;  [facility];  Service: Endoscopy;;   RETINAL DETACHMENT SURGERY Right 2013   TENOLYSIS Right 12/21/2013   Procedure: TENOLYSIS FLEXOR CARPI RADIALIS ,DEBRIDEMENT RIGHT JOINT WRIST,DEBRIDEMENT SCAPHOTRAPEZIAL TRAPEZOID, REPAIR OF EXTENSOR HOOD;  Surgeon: Daryll Brod, MD;  Location: [facility];  Service: Orthopedics;  Laterality: Right;   TOE SURGERY Right 09/2019   3rd toe/hammer toe   V-TACH ABLATION  12/21/2014   VIDEO BRONCHOSCOPY Bilateral 01/09/2016   Procedure: VIDEO  BRONCHOSCOPY WITHOUT FLUORO;  Surgeon: Juanito Doom, MD;  [facility];  Service: Cardiopulmonary;  Laterality: Bilateral;       Home Medications    Prior to Admission medications   Medication Sig Start Date End Date Taking? Authorizing Provider  albuterol (PROVENTIL) (2.5 MG/3ML) 0.083% nebulizer solution USE 1 VIAL VIA NEBULIZER EVERY 6 HOURS AS NEEDED FOR WHEEZING OR SHORTNESS OF BREATH 07/18/19   Laurin Coder, MD  amiodarone (PACERONE) 200 MG tablet TAKE ONE TABLET BY MOUTH DAILY 07/22/21   Belva Crome, MD  apixaban (ELIQUIS) 5 MG TABS tablet Take 1 tablet (5 mg total) by mouth 2 (two) times daily. 10/10/21   Belva Crome, MD  Artificial Tear Solution (SOOTHE XP OP) Place 2 drops into both eyes daily as needed (dry eyes).    [provider]  benazepril (LOTENSIN) 10 MG tablet Take 0.5 mg by mouth daily. 09/09/16   [provider]  buPROPion (  WELLBUTRIN) 75 MG tablet TAKE ONE TABLET BY MOUTH DAILY 09/10/21   Fargo, Amy E, NP  busPIRone (BUSPAR) 15 MG tablet Take 15 mg by mouth 3 (three) times daily. Patient taking 10 mg by mouth twice daily    [provider]  carvedilol (COREG) 3.125 MG tablet Take 1 tablet (3.125 mg total) by mouth 2 (two) times daily. 09/19/21   Belva Crome, MD  cetirizine (ZYRTEC) 10 MG tablet Take 1 tablet by mouth daily. 12/16/17   [provider]  Cyanocobalamin (VITAMIN B-12) 5000 MCG SUBL Place under the tongue daily.    [provider]  escitalopram (LEXAPRO) 20 MG tablet Take 20 mg by mouth daily. Take 1/2 tablet (10 mg total ) by mouth daily    [provider]  fluticasone (FLONASE) 50 MCG/ACT nasal spray Place 2 sprays into both nostrils daily. 02/13/21   Fargo, Amy E, NP  furosemide (LASIX) 40 MG tablet Take 0.5 tablets (20 mg total) by mouth daily. 11/13/21   Fargo, Amy E, NP  gabapentin (NEURONTIN) 300 MG capsule Take 400 mg by mouth 2 (two) times daily. Pt take 1 tablet 300 mg at  night and 100 mg in the morning.    [provider]  guaiFENesin (MUCINEX) 600 MG 12 hr tablet Take 1 tablet by mouth 2 (two) times daily.    [provider]  Ipratropium-Albuterol (COMBIVENT RESPIMAT) 20-100 MCG/ACT AERS respimat Inhale 1 puff into the lungs every 6 (six) hours. Shortness of breath or wheezing 09/18/21   [name] E, NP  IRON PO Take by mouth daily.     [provider]  MAGNESIUM-OXIDE 400 (241.3 Mg) MG tablet TAKE 1 TABSULE BY MOUTH DAILY 11/24/18   Hoyt Koch, MD  mexiletine (MEXITIL) 200 MG capsule Take 1 capsule (200 mg total) by mouth 2 (two) times daily. 10/10/21   Belva Crome, MD  Multiple Vitamins-Minerals (CENTRUM ADULTS PO) Take by mouth daily.    [provider]  mupirocin ointment (BACTROBAN) 2 % Apply 1 application. topically 2 (two) times daily. Apply to lower leg wounds daily prn 07/17/21   Fargo, Amy E, NP  nitroGLYCERIN (NITROSTAT) 0.4 MG SL tablet Place 1 tablet (0.4 mg total) under the tongue every 5 (five) minutes as needed for chest pain. DISSOLVE 1 TABLET UNDER THE TONGUE EVERY 5 MINUTES FOR 3 DOSES 05/08/21   Belva Crome, MD  omeprazole (PRILOSEC) 20 MG capsule Take 20 mg by mouth daily.     [provider]  potassium chloride SA (KLOR-CON M) 20 MEQ tablet Take 20 mEq by mouth once.    [provider]  Respiratory Therapy Supplies (FLUTTER) DEVI Use as directed 03/16/17   Juanito Doom, MD  rosuvastatin (CRESTOR) 40 MG tablet Take 1 tablet (40 mg total) by mouth daily. 06/05/21   [name], MD  Sennosides-Docusate Sodium (STOOL SOFTENER/LAXATIVE PO) Take 2 tablets by mouth daily.    [provider]  Spacer/Aero-Holding Chambers (OPTICHAMBER DIAMOND) [place] optichamber [facility] 09/12/19   Olalere, Ernesto Rutherford, MD  STIOLTO RESPIMAT 2.5-2.5 MCG/ACT AERS Inhale 2 puffs into the lungs daily. 09/22/21   [provider]  tamsulosin (FLOMAX) 0.4 MG CAPS capsule TAKE ONE CAPSULE BY MOUTH  DAILY AFTER SUPPER 06/30/21   Yvonna Alanis, NP    Family History Family History  Problem Relation Age of Onset   Heart attack Brother    CAD Father    Hypertension Father    CAD Mother  Hypertension Mother    Hypertension Brother    Stroke Neg Hx     Social History Social History   Tobacco Use   Smoking status: Former    Packs/day: 1.00    Years: 55.00    Total pack years: 55.00    Types: Cigarettes   Smokeless tobacco: Never   Tobacco comments:    off/on, always ready to quit but does not work out  Scientific laboratory technician Use: Never used  Substance Use Topics   Alcohol use: Yes    Alcohol/week: 0.0 standard drinks of alcohol    Comment: occ   Drug use: No     Allergies   Sulfa antibiotics   Review of Systems Review of Systems See HPI  Physical Exam Triage Vital Signs ED Triage Vitals [12/22/21 1927]  Enc Vitals Group     BP 116/73     Pulse Rate 66     Resp 14     Temp 97.9 F (36.6 C)     Temp Source Oral     SpO2 95 %     Weight      Height      Head Circumference      Peak Flow      Pain Score 0     Pain Loc      Pain Edu?      Excl. in [place]?    No data found.  Updated Vital Signs BP 116/73 (BP Location: Left Arm)   Pulse 66   Temp 97.9 F (36.6 C) (Oral)   Resp 14   SpO2 95%     Physical Exam Constitutional:      General: He is not in acute distress.    Appearance: He is well-developed and normal weight.     Comments: Pleasant elderly gentleman.  No acute distress.  Stooped posture, somewhat head forward, small steps  HENT:     Head: Normocephalic and atraumatic.  Eyes:     Conjunctiva/sclera: Conjunctivae normal.     Pupils: Pupils are equal, round, and reactive to light.  Cardiovascular:     Rate and Rhythm: Normal rate. Rhythm irregular.     Heart sounds: Murmur heard.  Pulmonary:     Effort: Pulmonary effort is normal. No respiratory distress.     Breath sounds: Normal breath sounds. No wheezing or rhonchi.  Abdominal:      General: Abdomen is flat. Bowel sounds are normal. There is no distension.     Palpations: Abdomen is soft. There is no hepatomegaly, splenomegaly or pulsatile mass.     Tenderness: There is abdominal tenderness in the epigastric area.     Comments: Very mild tenderness to deep palpation of epigastric region.  Abdominal exam is otherwise unremarkable.  Reducible umbilical, nontender hernia  Musculoskeletal:        General: Normal range of motion.     Cervical back: Normal range of motion.  Skin:    General: Skin is warm and dry.  Neurological:     General: No focal deficit present.     Mental Status: He is alert.      UC Treatments / Results  Labs (all labs ordered are listed, but only abnormal results are displayed) Labs Reviewed - No data to display  EKG   Radiology No results found.  Procedures Procedures (including critical care time)  Medications Ordered in UC Medications - No data to display  Initial Impression / Assessment and Plan / UC Course  I have reviewed the triage vital signs and the nursing notes.  Pertinent labs & imaging results that were available during my care of the patient were reviewed by me and considered in my medical decision making (see chart for details).     Patient expresses concern because of his abdominal pain.  He worries about his vascular disease and known abdominal aortic aneurysm.  Also worried about his hernia.  Wondered if something else was going on.  His exam is benign.  His improvement with buttermilk is reassuring. Chart was reviewed including his most recent CT angio of chest and abdomen Final Clinical Impressions(s) / UC Diagnoses   Final diagnoses:  Abdominal pain, epigastric  Abdominal aortic aneurysm (AAA) without rupture, unspecified part ([place])  Umbilical hernia without obstruction and without gangrene     Discharge Instructions      I believe this pain was from your stomach, and is gastrointestinal If it happens  again I would take an antacid like Maalox or Mylanta No evidence of anything serious like complication with your heart or aneurysms Follow-up with your primary care doctor   ED Prescriptions   None    PDMP not reviewed this encounter.   Raylene Everts, MD 12/22/21 2002

## 2021-12-22 NOTE — Discharge Instructions (Signed)
I believe this pain was from your stomach, and is gastrointestinal If it happens again I would take an antacid like Maalox or Mylanta No evidence of anything serious like complication with your heart or aneurysms Follow-up with your primary care doctor

## 2021-12-22 NOTE — ED Triage Notes (Signed)
Pt presents with c/o epigastric pain 20 minutes ago. Pt states he then drank a glass of buttermilk that helped ease his pain. Pt states his pain has resolved.

## 2021-12-23 ENCOUNTER — Telehealth: Payer: Self-pay | Admitting: Emergency Medicine

## 2021-12-23 ENCOUNTER — Encounter: Payer: Self-pay | Admitting: Physical Therapy

## 2021-12-23 ENCOUNTER — Ambulatory Visit: Payer: Medicare Other | Admitting: Physical Therapy

## 2021-12-23 DIAGNOSIS — R293 Abnormal posture: Secondary | ICD-10-CM

## 2021-12-23 DIAGNOSIS — M542 Cervicalgia: Secondary | ICD-10-CM | POA: Diagnosis not present

## 2021-12-23 DIAGNOSIS — R2689 Other abnormalities of gait and mobility: Secondary | ICD-10-CM

## 2021-12-23 DIAGNOSIS — M6281 Muscle weakness (generalized): Secondary | ICD-10-CM

## 2021-12-23 NOTE — Therapy (Signed)
OUTPATIENT PHYSICAL THERAPY TREATMENT     Patient Name: Steven Guzman MRN: [id number] DOB:04-04-1940, 81 y.o., male Today's Date: 12/23/2021  PCP: [name] NP REFERRING PROVIDER: [name] NP   PT End of Session - 12/23/21 1447     Visit Number 7    Number of Visits 12    Date for PT Re-Evaluation 01/14/22    Authorization - Visit Number 7    Progress Note Due on Visit 10    PT Start Time 1400    PT Stop Time 1445    PT Time Calculation (min) 45 min    Activity Tolerance Patient tolerated treatment well    Behavior During Therapy WFL for tasks assessed/performed                Past Medical History:  Diagnosis Date   AICD (automatic cardioverter/defibrillator) present    Aneurysm ([place])    a. Aneurysmal infrarenal aorta up to 33 mm on CT 10/2014, recommended f/u due 10/2017   Anginal pain ([place])    Anxiety    Basal cell carcinoma of nose    S/P MOHS   Biliary acute pancreatitis    CAD (coronary artery disease)    a. s/p MI in 1994 with PCI to LAD at that time b. cath 10/2012 demonstrated EF 30%, inferior akinesis with mild hypokinesis of all walls, patent LAD and RCA stents; ostial PDA with 80-90% obstruction with medical therapy recommended    Chronic systolic CHF (congestive heart failure) (HCC)    EF 30 to 35 % as of 09/2014.    CKD (chronic kidney disease), stage III ([place])    Complication of anesthesia 10/2014   "had to have defibrillator w/ERCP"   COPD (chronic obstructive pulmonary disease) ([place])    a. followed by pulmonary, COPD GOLD stage II   Depression    Diverticulosis of colon 07/2014   noted on CT   GERD (gastroesophageal reflux disease)    Hiatal hernia    Hyperglycemia 10/2012.   Hyperlipidemia    Hypertension    Myocardial infarction [facility]) 1994; 2011   Pneumonia 1946; 2015   Prostate enlargement 07/2014   observed on CT   Tobacco abuse    Ventricular tachycardia ([place])    a. 08/2009 s/p BSX E110 Teligen 100 AICD, ser#: [id number];  b. 08/2008 VT  req ATP - detection reprogrammed from 160 to 150. c. EPS and VT ablation by Dr. Lovena Le 12/21/2014   Past Surgical History:  Procedure Laterality Date   BIOPSY  12/21/2017   Procedure: BIOPSY;  Surgeon: [name], MD;  Location: [facility];  Service: Endoscopy;;   CATARACT EXTRACTION W/ INTRAOCULAR LENS  IMPLANT, BILATERAL Bilateral ~ 2011   COLONOSCOPY     COLONOSCOPY WITH PROPOFOL N/A 12/21/2017   Procedure: COLONOSCOPY WITH PROPOFOL;  Surgeon: [name], MD;  Location: [facility];  Service: Endoscopy;  Laterality: N/A;   ELECTROPHYSIOLOGIC STUDY N/A 12/21/2014   Procedure: V Tach Ablation;  Surgeon: [name], MD;  Location: [facility];  Service: Cardiovascular;  Laterality: N/A;   ERCP N/A 11/16/2014   Procedure: ENDOSCOPIC RETROGRADE CHOLANGIOPANCREATOGRAPHY (ERCP);  Surgeon: [name], MD;  Location: [facility];  Service: Endoscopy;  Laterality: N/A;   ESOPHAGOGASTRODUODENOSCOPY (EGD) WITH PROPOFOL N/A 12/21/2017   Procedure: ESOPHAGOGASTRODUODENOSCOPY (EGD) WITH PROPOFOL;  Surgeon: [name], MD;  [facility]: [facility];  Service: Endoscopy;  Laterality: N/A;   EYE SURGERY     FOOT SURGERY Left 2005   "  fixed bone that stuck out in my ankle area"   [place]  09/06/09   BSX dual chamber ICD implanted in [place] for cardiac arrest 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   [place] Right ~ [place] N/A 11/25/2012   demonstrated EF 30%, inferior akinesis with mild hypokinesis of all walls, patent LAD and RCA stents; ostial PDA with 80-90% obstruction with medical therapy recommended   MOHS SURGERY  2008   nose, skin graft   POLYPECTOMY  12/21/2017   Procedure: POLYPECTOMY;  Surgeon: [name], MD;  Location: [facility];  Service: Endoscopy;;   RETINAL DETACHMENT SURGERY Right 2013   TENOLYSIS Right 12/21/2013   Procedure: TENOLYSIS FLEXOR CARPI  RADIALIS ,DEBRIDEMENT RIGHT JOINT WRIST,DEBRIDEMENT SCAPHOTRAPEZIAL TRAPEZOID, REPAIR OF EXTENSOR HOOD;  Surgeon: Daryll Brod, MD;  Location: [facility];  Service: Orthopedics;  Laterality: Right;   TOE SURGERY Right 09/2019   3rd toe/hammer toe   V-TACH ABLATION  12/21/2014   VIDEO BRONCHOSCOPY Bilateral 01/09/2016   Procedure: VIDEO BRONCHOSCOPY WITHOUT FLUORO;  Surgeon: Juanito Doom, MD;  Location: [facility];  Service: Cardiopulmonary;  Laterality: Bilateral;   Patient Active Problem List   Diagnosis Date Noted   Major depressive disorder with single episode, in partial remission ([place]) 03/05/2020   Paraseptal emphysema ([place]) 12/15/2018   Tobacco abuse 12/15/2018   Perianal abscess 11/15/2018   Left inguinal hernia 11/15/2018   Leg wound, right 11/15/2018   Weight loss 09/29/2018   Dark stools 09/29/2018   Lumbar trigger point syndrome 04/20/2018   Throat and mouth symptom 02/03/2018   Gastroesophageal reflux disease without esophagitis    Esophageal stricture    Gastritis and gastroduodenitis    Degenerative cervical spinal stenosis 10/27/2017   Carotid bruit 10/27/2017   B12 deficiency 08/25/2017   Anemia 06/10/2017   Right ankle pain 04/09/2017   BPH (benign prostatic hyperplasia) 12/31/2016   Dizzinesses 05/22/2016   Mass of throat 06/30/2015   Depression 05/24/2015   Atrial fibrillation ([place]) 03/15/2015   Routine general medical examination at a health care facility 05/12/2014   Hemoptysis 03/16/2014   COPD GOLD GRADE C 12/75/1700   Chronic systolic heart failure ([place]) 07/07/2013   CAD (coronary artery disease) 11/23/2012   Smokers' cough ([place]) 11/23/2012   Ventricular tachycardia ([place]) 11/23/2012   Essential hypertension 11/23/2012   Hyperlipidemia 11/23/2012   ICD (implantable cardioverter-defibrillator) in place 11/23/2012    ONSET DATE: ~6-8 months ago  REFERRING DIAG: Frequent falls  THERAPY DIAG:  Cervicalgia  Abnormal  posture  Muscle weakness (generalized)  Other abnormalities of gait and mobility  Rationale for Evaluation and Treatment Rehabilitation  SUBJECTIVE:   SUBJECTIVE STATEMENT: Pt continues to report that he did not do his exercises at home. He states he has been very busy   (From eval) Pt reports history of vertigo but mostly complains of feelings of soreness in head/neck (ongoing 6-8 months). Feels this may be related to his balance/falls. Did not have to use cane in the house but has needed to because of back pain (this has been chronic but worse lately).  Pt accompanied by: self  PERTINENT HISTORY: Vertigo, 2 dislocated shoulders, back pain since 2012   PAIN:  Are you having pain? 0/10  PRECAUTIONS: Fall  WEIGHT BEARING RESTRICTIONS No  FALLS: Has patient fallen in last 6 months? Yes. Number of falls 1  LIVING ENVIRONMENT: Lives with: lives with  their family and lives with their spouse Lives in: House/apartment Has following equipment at home: Single point cane  PLOF: Independent  PATIENT GOALS Improve neck pain so he doesn't have to sleep on a heating pad  OBJECTIVE:    Cervical ROM:    Active A/PROM (deg) eval 12/12/21 12/23/21  Flexion 25 55   Extension 25 (sore pain) 20 - no pain   Right lateral flexion 24 25   Left lateral flexion 18 30   Right rotation 30  40  Left rotation 35  44  (Blank rows = not tested)  STRENGTH:   UPPER EXTREMITY MMT:  MMT Right eval Left eval  Shoulder flexion 5 5  Shoulder extension 5 5  Shoulder abduction 5 5  Shoulder adduction    Shoulder internal rotation 5 5  Shoulder external rotation 3+ 3+  Middle trapezius 3+ 3+  Lower trapezius 3+ 3+  Elbow flexion    Elbow extension    Wrist flexion    Wrist extension    Wrist ulnar deviation    Wrist radial deviation    Wrist pronation    Wrist supination    Grip strength     (Blank rows = not tested)   LOWER EXTREMITY MMT:   MMT Right eval Left eval  Hip  flexion 5 5  Hip abduction    Hip adduction    Hip internal rotation    Hip external rotation    Knee flexion 4+ 5  Knee extension 5 5  Ankle dorsiflexion    Ankle plantarflexion    Ankle inversion    Ankle eversion    (Blank rows = not tested)   GAIT: Gait pattern: step through pattern, Right foot flat, Left foot flat, knee flexed in stance- Right, knee flexed in stance- Left, and trunk flexed Distance walked: 100' Assistive device utilized: Single point cane Level of assistance: Modified independence Comments: n/a  FUNCTIONAL TESTs:  Dynamic Gait Index: 17   TODAY'S TREATMENT 12/23/21 UBE L4 x 4 min alt fwd/bkwd  Standing: Doorway pec stretch mid 2 x 30 sec "T", "I" green band 2 x 10 "Y" red TB x 12  Seated: Cervical ext with towel 2 x 20 sec Cervical rotation 2 x 20 sec bilat Cervical lateral flexion 2 x 20 sec bilat  Manual: STM and TPR cervical and thoracic paraspinals, bilat UT Cervical - Thoracic PA mobs grade II to III     12/18/21 THEREX  Sitting UBE L4 x 4 min alt fwd/bwd Cervical ext with towel 2x20 sec Cervical lateral flexion 2x20 sec R&L Cervical rotation with towel 2x20 sec Cervical rotation with retraction x10 Horizontal abd green TB 2x10 Low trap green TB 2x10    Standing  Doorway pec stretch mid and high 2x30 sec    Prone  "I", "W" 2x10  MANUAL THERAPY  STM and TPR cervical and thoracic paraspinals, bilat UT Cervical - Thoracic PA mobs grade II to III     12/16/21  UBE L3 X 4 min alt fwd/bwd   Standing  Doorway pec stretch mid and high 2x30 sec  Row green TB 2x10  Bow and arrow green TB 2x10   With pool noodle behind back:  Cervical retraction 3x10 sec  Scap squeeze 3x10 sec  Shoulder ER yellow TB 2x10  "W" yellow TB 2x10  Horizontal shoulder abd yellow TB 2x10    Manual therapy  STM and TPR cervical paraspinals and bilat UT  Manual traction and cervical PROM all directions  PATIENT EDUCATION: Education  details: Exam findings, POC, TPDN Person educated: Patient Education method: Explanation, Demonstration, and Handouts Education comprehension: verbalized understanding and returned demonstration  Access Code: [email or web address] URL: [email or web address] Date: 12/04/2021 Prepared by: Estill Bamberg April Thurnell Garbe  Exercises - Doorway Pec Stretch at 60 Elevation  - 1 x daily - 7 x weekly - 2 sets - 30 sec hold - Doorway Pec Stretch at 90 Degrees Abduction  - 1 x daily - 7 x weekly - 2 sets - 30 sec hold - Standing Scapular Retraction  - 1 x daily - 7 x weekly - 2 sets - 10 reps - Cervical Retraction at Wall  - 1 x daily - 7 x weekly - 1 sets - 10 reps - Shoulder External Rotation and Scapular Retraction  - 1 x daily - 7 x weekly - 2 sets - 10 reps - Shoulder External Rotation in 45 Degrees Abduction  - 1 x daily - 7 x weekly - 2 sets - 10 reps  GOALS: Goals reviewed with patient? Yes  SHORT TERM GOALS: Target date: 12/24/2021   Pt will be ind with initial HEP Baseline: Goal status: IN PROGRESS  2.  Pt will report at least 25% decrease in neck/head pain Baseline:  Goal status: MET   LONG TERM GOALS: Target date: 01/14/2022    Pt will be ind with final HEP and transition to community wellness Baseline:  Goal status: INITIAL  2.  Pt will report no longer needing heating pad to sleep per his goal Baseline:  Goal status: INITIAL  3.  Pt will demo at least 20/24 DGI to demo decreased risk of falls Baseline:  Goal status: INITIAL  4.  Pt will demo at least 5-10 deg improvement in C-spine ROM for driving tasks Baseline:  Goal status: INITIAL  5.  Pt will demo at least 4/5 strength in posterior shoulder girdle to demo improved postural stability for reduced neck/back pain with amb Baseline:  Goal status: INITIAL   ASSESSMENT:  CLINICAL IMPRESSION: Pt with improving ROM. Good tolerance to postural strengthening. He is making progress towards goals. Continued education on  importance of performing HEP  From eval: Patient is an 81 y.o. M who was seen today for physical therapy evaluation and treatment for neck/head pain with referral for falls and unstable gait. PMH significant for 1 recent fall, chronic back pain and history of BPPV likely adding to his current deficits. Assessment significant for poor postural stability with decreased posterior shoulder girdle strength, reduced cervical and thoracic ROM, multiple cervical muscle trigger points all leading towards reduced dynamic balance and pain affecting community and home activities. Pt would benefit from PT to address these issues.     PLAN: PT FREQUENCY: 2x/week  PT DURATION: 6 weeks  PLANNED INTERVENTIONS: Therapeutic exercises, Therapeutic activity, Neuromuscular re-education, Balance training, Gait training, Patient/Family education, Self Care, Joint mobilization, Vestibular training, Dry Needling, Spinal mobilization, Cryotherapy, Moist heat, Taping, Traction, Ionotophoresis 60m/ml Dexamethasone, Manual therapy, and Re-evaluation  PLAN FOR NEXT SESSION: manual as indicated. Continue postural strengthening and balance.    Valisha Heslin, PT, DPT 12/23/2021, 2:48 PM

## 2021-12-23 NOTE — Telephone Encounter (Signed)
[place].  Advised if patient is doing well to disregard the call.  Any questions or concerns please contact the office.

## 2021-12-26 ENCOUNTER — Ambulatory Visit: Payer: Medicare Other | Admitting: Rehabilitative and Restorative Service Providers"

## 2021-12-26 ENCOUNTER — Encounter: Payer: Self-pay | Admitting: Rehabilitative and Restorative Service Providers"

## 2021-12-26 DIAGNOSIS — R293 Abnormal posture: Secondary | ICD-10-CM

## 2021-12-26 DIAGNOSIS — M542 Cervicalgia: Secondary | ICD-10-CM | POA: Diagnosis not present

## 2021-12-26 DIAGNOSIS — M6281 Muscle weakness (generalized): Secondary | ICD-10-CM

## 2021-12-26 DIAGNOSIS — R2689 Other abnormalities of gait and mobility: Secondary | ICD-10-CM

## 2021-12-26 NOTE — Therapy (Signed)
OUTPATIENT PHYSICAL THERAPY TREATMENT     Patient Name: Steven Guzman MRN: [id number] DOB:[birth year], 81 y.o., male Today's Date: 12/26/2021  PCP: [name] NP REFERRING PROVIDER: [name] NP   PT End of Session - 12/26/21 0914     Visit Number 8    Number of Visits 12    Date for PT Re-Evaluation 01/14/22    Authorization - Visit Number 8    Progress Note Due on Visit 10    PT Start Time 0920    PT Stop Time 1005    PT Time Calculation (min) 45 min    Activity Tolerance Patient tolerated treatment well    Behavior During Therapy WFL for tasks assessed/performed                Past Medical History:  Diagnosis Date   AICD (automatic cardioverter/defibrillator) present    Aneurysm ([place])    a. Aneurysmal infrarenal aorta up to 33 mm on CT 10/2014, recommended f/u due 10/2017   Anginal pain ([place])    Anxiety    Basal cell carcinoma of nose    S/P MOHS   Biliary acute pancreatitis    CAD (coronary artery disease)    a. s/p MI in 1994 with PCI to LAD at that time b. cath 10/2012 demonstrated EF 30%, inferior akinesis with mild hypokinesis of all walls, patent LAD and RCA stents; ostial PDA with 80-90% obstruction with medical therapy recommended    Chronic systolic CHF (congestive heart failure) (HCC)    EF 30 to 35 % as of 09/2014.    CKD (chronic kidney disease), stage III ([place])    Complication of anesthesia 10/2014   "had to have defibrillator w/ERCP"   COPD (chronic obstructive pulmonary disease) ([place])    a. followed by pulmonary, COPD GOLD stage II   Depression    Diverticulosis of colon 07/2014   noted on CT   GERD (gastroesophageal reflux disease)    Hiatal hernia    Hyperglycemia 10/2012.   Hyperlipidemia    Hypertension    Myocardial infarction [facility]) 1994; 2011   Pneumonia 1946; 2015   Prostate enlargement 07/2014   observed on CT   Tobacco abuse    Ventricular tachycardia ([place])    a. 08/2009 s/p BSX E110 Teligen 100 AICD, ser#: [id number];  b. 08/2008 VT  req ATP - detection reprogrammed from 160 to 150. c. EPS and VT ablation by Dr. Lovena Le 12/21/2014   Past Surgical History:  Procedure Laterality Date   BIOPSY  12/21/2017   Procedure: BIOPSY;  Surgeon: [name], MD;  Location: [facility];  Service: Endoscopy;;   CATARACT EXTRACTION W/ INTRAOCULAR LENS  IMPLANT, BILATERAL Bilateral ~ 2011   COLONOSCOPY     COLONOSCOPY WITH PROPOFOL N/A 12/21/2017   Procedure: COLONOSCOPY WITH PROPOFOL;  Surgeon: [name], MD;  Location: [facility];  Service: Endoscopy;  Laterality: N/A;   ELECTROPHYSIOLOGIC STUDY N/A 12/21/2014   Procedure: V Tach Ablation;  Surgeon: [name], MD;  Location: [place] [facility];  Service: Cardiovascular;  Laterality: N/A;   ERCP N/A 11/16/2014   Procedure: ENDOSCOPIC RETROGRADE CHOLANGIOPANCREATOGRAPHY (ERCP);  Surgeon: [name], MD;  Location: [place];  Service: Endoscopy;  Laterality: N/A;   ESOPHAGOGASTRODUODENOSCOPY (EGD) WITH PROPOFOL N/A 12/21/2017   Procedure: ESOPHAGOGASTRODUODENOSCOPY (EGD) WITH PROPOFOL;  Surgeon: [name], MD;  [facility]: [facility];  Service: Endoscopy;  Laterality: N/A;   EYE SURGERY     FOOT SURGERY Left 2005   "  fixed bone that stuck out in my ankle area"   [place]  09/06/09   BSX dual chamber ICD implanted in [place] for cardiac arrest 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   [place] Right ~ [place] N/A 11/25/2012   demonstrated EF 30%, inferior akinesis with mild hypokinesis of all walls, patent LAD and RCA stents; ostial PDA with 80-90% obstruction with medical therapy recommended   MOHS SURGERY  2008   nose, skin graft   POLYPECTOMY  12/21/2017   Procedure: POLYPECTOMY;  Surgeon: [name], MD;  [facility];  Service: Endoscopy;;   RETINAL DETACHMENT SURGERY Right 2013   TENOLYSIS Right 12/21/2013   Procedure: TENOLYSIS FLEXOR CARPI  RADIALIS ,DEBRIDEMENT RIGHT JOINT WRIST,DEBRIDEMENT SCAPHOTRAPEZIAL TRAPEZOID, REPAIR OF EXTENSOR HOOD;  Surgeon: Daryll Brod, MD;  Location: [place];  Service: Orthopedics;  Laterality: Right;   TOE SURGERY Right 09/2019   3rd toe/hammer toe   V-TACH ABLATION  12/21/2014   VIDEO BRONCHOSCOPY Bilateral 01/09/2016   Procedure: VIDEO BRONCHOSCOPY WITHOUT FLUORO;  Surgeon: Juanito Doom, MD;  [facility];  Service: Cardiopulmonary;  Laterality: Bilateral;   Patient Active Problem List   Diagnosis Date Noted   Major depressive disorder with single episode, in partial remission ([place]) 03/05/2020   Paraseptal emphysema ([place]) 12/15/2018   Tobacco abuse 12/15/2018   Perianal abscess 11/15/2018   Left inguinal hernia 11/15/2018   Leg wound, right 11/15/2018   Weight loss 09/29/2018   Dark stools 09/29/2018   Lumbar trigger point syndrome 04/20/2018   Throat and mouth symptom 02/03/2018   Gastroesophageal reflux disease without esophagitis    Esophageal stricture    Gastritis and gastroduodenitis    Degenerative cervical spinal stenosis 10/27/2017   Carotid bruit 10/27/2017   B12 deficiency 08/25/2017   Anemia 06/10/2017   Right ankle pain 04/09/2017   BPH (benign prostatic hyperplasia) 12/31/2016   Dizzinesses 05/22/2016   Mass of throat 06/30/2015   Depression 05/24/2015   Atrial fibrillation ([place]) 03/15/2015   Routine general medical examination at a health care facility 05/12/2014   Hemoptysis 03/16/2014   COPD GOLD GRADE C 67/20/9470   Chronic systolic heart failure ([place]) 07/07/2013   CAD (coronary artery disease) 11/23/2012   Smokers' cough ([place]) 11/23/2012   Ventricular tachycardia ([place]) 11/23/2012   Essential hypertension 11/23/2012   Hyperlipidemia 11/23/2012   ICD (implantable cardioverter-defibrillator) in place 11/23/2012    ONSET DATE: ~6-8 months ago  REFERRING DIAG: Frequent falls  THERAPY DIAG:  Cervicalgia  Abnormal  posture  Muscle weakness (generalized)  Other abnormalities of gait and mobility  Rationale for Evaluation and Treatment Rehabilitation  SUBJECTIVE:   SUBJECTIVE STATEMENT: The patient had a fall from a 2 step ladder after working around a window this week.  He scraped 6" of skin off of his leg, and did not hit his hand. He woke with a HA today.  "Let me be honest about something.  I lost my paper and I'm not doing my homework."  Pt accompanied by: self  PERTINENT HISTORY: Vertigo, 2 dislocated shoulders, back pain since 2012 Pt reports history of vertigo but mostly complains of feelings of soreness in head/neck (ongoing 6-8 months). Feels this may be related to his balance/falls. Did not have to use cane in the house but has needed to because of back pain (this has been chronic but worse lately).   PAIN:  Are  you having pain? Yes: NPRS scale: 6/10 Pain location: headache Pain description: headache and neck pain  Aggravating factors: movement Relieving factors: unsure *Patient is sure he did not hit his head with fall this week.  PRECAUTIONS: Fall  PATIENT GOALS Improve neck pain so he doesn't have to sleep on a heating pad  OBJECTIVE:    Cervical ROM:    Active A/PROM (deg) eval 12/12/21 12/23/21  Flexion 25 55   Extension 25 (sore pain) 20 - no pain   Right lateral flexion 24 25   Left lateral flexion 18 30   Right rotation 30  40  Left rotation 35  44  (Blank rows = not tested)  UPPER EXTREMITY MMT:  MMT Right eval Left eval  Shoulder flexion 5 5  Shoulder extension 5 5  Shoulder abduction 5 5  Shoulder adduction    Shoulder internal rotation 5 5  Shoulder external rotation 3+ 3+  Middle trapezius 3+ 3+  Lower trapezius 3+ 3+  Elbow flexion    Elbow extension    Wrist flexion    Wrist extension    Wrist ulnar deviation    Wrist radial deviation    Wrist pronation    Wrist supination    Grip strength     (Blank rows = not tested)   LOWER EXTREMITY  MMT:   MMT Right eval Left eval  Hip flexion 5 5  Hip abduction    Hip adduction    Hip internal rotation    Hip external rotation    Knee flexion 4+ 5  Knee extension 5 5  Ankle dorsiflexion    Ankle plantarflexion    Ankle inversion    Ankle eversion    (Blank rows = not tested)   TODAY'S TREATMENT 12/26/21: THEREX UBE X LEVEL 2 x 2 minutes forward/2 minutes backwards Supine Horizontal abduction shoulders green band x 12 reps x 2 sets Neck A/ROM with isometrics at end range rotation R and L sides Coregous ball stretch for anterior chest  Standing Cervical chin tucks with tactile and demo cues x 10 reps Ls with coregous ball x 10 reps Ls adding green band x 10 reps Shoulder horiz abduction green band x 10 reps  MANUAL Supine STM cervical spine, suboccipitals, scalenes, SCM, upper trap, pectoralis minor  12/23/21 UBE L4 x 4 min alt fwd/bkwd  Standing: Doorway pec stretch mid 2 x 30 sec "T", "I" green band 2 x 10 "Y" red TB x 12  Seated: Cervical ext with towel 2 x 20 sec Cervical rotation 2 x 20 sec bilat Cervical lateral flexion 2 x 20 sec bilat  Manual: STM and TPR cervical and thoracic paraspinals, bilat UT Cervical - Thoracic PA mobs grade II to III   12/18/21 THEREX  Sitting UBE L4 x 4 min alt fwd/bwd Cervical ext with towel 2x20 sec Cervical lateral flexion 2x20 sec R&L Cervical rotation with towel 2x20 sec Cervical rotation with retraction x10 Horizontal abd green TB 2x10 Low trap green TB 2x10    Standing  Doorway pec stretch mid and high 2x30 sec    Prone  "I", "W" 2x10  MANUAL THERAPY  STM and TPR cervical and thoracic paraspinals, bilat UT Cervical - Thoracic PA mobs grade II to III      PATIENT EDUCATION: Education details: Exam findings, POC, TPDN Person educated: Patient Education method: Explanation, Demonstration, and Handouts Education comprehension: verbalized understanding and returned demonstration  Access Code:  [email or web address] URL: [email or web address] Date: --REPRINTED ON 12/26/21 Prepared  by: Dyanne Carrel Thurnell Garbe  Exercises - Doorway Pec Stretch at 60 Elevation  - 1 x daily - 7 x weekly - 2 sets - 30 sec hold - Doorway Pec Stretch at 90 Degrees Abduction  - 1 x daily - 7 x weekly - 2 sets - 30 sec hold - Standing Scapular Retraction  - 1 x daily - 7 x weekly - 2 sets - 10 reps - Cervical Retraction at Wall  - 1 x daily - 7 x weekly - 1 sets - 10 reps - Shoulder External Rotation and Scapular Retraction  - 1 x daily - 7 x weekly - 2 sets - 10 reps - Shoulder External Rotation in 45 Degrees Abduction  - 1 x daily - 7 x weekly - 2 sets - 10 reps  GOALS: Goals reviewed with patient? Yes  SHORT TERM GOALS: Target date: 12/24/2021   Pt will be ind with initial HEP Baseline: Goal status: IN PROGRESS  2.  Pt will report at least 25% decrease in neck/head pain Baseline:  Goal status: MET   LONG TERM GOALS: Target date: 01/14/2022    Pt will be ind with final HEP and transition to community wellness Baseline:  Goal status: INITIAL  2.  Pt will report no longer needing heating pad to sleep per his goal Baseline:  Goal status: INITIAL  3.  Pt will demo at least 20/24 DGI to demo decreased risk of falls Baseline:  Goal status: INITIAL  4.  Pt will demo at least 5-10 deg improvement in C-spine ROM for driving tasks Baseline:  Goal status: INITIAL  5.  Pt will demo at least 4/5 strength in posterior shoulder girdle to demo improved postural stability for reduced neck/back pain with amb Baseline:  Goal status: INITIAL   ASSESSMENT:  CLINICAL IMPRESSION: The patient arrived with 6/10 headache.  He left with 0/10 pain today.  Pt needs cues for postural positioning and technique with exercises.  Plan to continue to work towards [place].   From eval: Patient is an 81 y.o. M who was seen today for physical therapy evaluation and treatment for neck/head pain with referral for  falls and unstable gait. PMH significant for 1 recent fall, chronic back pain and history of BPPV likely adding to his current deficits. Assessment significant for poor postural stability with decreased posterior shoulder girdle strength, reduced cervical and thoracic ROM, multiple cervical muscle trigger points all leading towards reduced dynamic balance and pain affecting community and home activities. Pt would benefit from PT to address these issues.     PLAN: PT FREQUENCY: 2x/week  PT DURATION: 6 weeks  PLANNED INTERVENTIONS: Therapeutic exercises, Therapeutic activity, Neuromuscular re-education, Balance training, Gait training, Patient/Family education, Self Care, Joint mobilization, Vestibular training, Dry Needling, Spinal mobilization, Cryotherapy, Moist heat, Taping, Traction, Ionotophoresis 43m/ml Dexamethasone, Manual therapy, and Re-evaluation  PLAN FOR NEXT SESSION: manual as indicated. Continue postural strengthening and balance.    [place] [facility] 12/26/2021, 9:15 AM

## 2021-12-29 ENCOUNTER — Encounter: Payer: Self-pay | Admitting: Rehabilitative and Restorative Service Providers"

## 2021-12-29 ENCOUNTER — Ambulatory Visit: Payer: Medicare Other | Attending: Orthopedic Surgery | Admitting: Rehabilitative and Restorative Service Providers"

## 2021-12-29 DIAGNOSIS — R293 Abnormal posture: Secondary | ICD-10-CM | POA: Diagnosis present

## 2021-12-29 DIAGNOSIS — R2689 Other abnormalities of gait and mobility: Secondary | ICD-10-CM | POA: Diagnosis present

## 2021-12-29 DIAGNOSIS — M6281 Muscle weakness (generalized): Secondary | ICD-10-CM | POA: Insufficient documentation

## 2021-12-29 DIAGNOSIS — M542 Cervicalgia: Secondary | ICD-10-CM | POA: Diagnosis not present

## 2021-12-29 NOTE — Therapy (Signed)
OUTPATIENT PHYSICAL THERAPY TREATMENT     Patient Name: Steven Guzman MRN: [id number] DOB:1940/04/14, 81 y.o., male Today's Date: 12/29/2021  PCP: [name] NP REFERRING PROVIDER: [name] NP   PT End of Session - 12/29/21 1405     Visit Number 9    Number of Visits 12    Date for PT Re-Evaluation 01/14/22    Authorization - Visit Number 9    Progress Note Due on Visit 10    PT Start Time 7628    PT Stop Time 1445    PT Time Calculation (min) 41 min    Activity Tolerance Patient tolerated treatment well    Behavior During Therapy WFL for tasks assessed/performed                Past Medical History:  Diagnosis Date   AICD (automatic cardioverter/defibrillator) present    Aneurysm ([place])    a. Aneurysmal infrarenal aorta up to 33 mm on CT 10/2014, recommended f/u due 10/2017   Anginal pain ([place])    Anxiety    Basal cell carcinoma of nose    S/P MOHS   Biliary acute pancreatitis    CAD (coronary artery disease)    a. s/p MI in 1994 with PCI to LAD at that time b. cath 10/2012 demonstrated EF 30%, inferior akinesis with mild hypokinesis of all walls, patent LAD and RCA stents; ostial PDA with 80-90% obstruction with medical therapy recommended    Chronic systolic CHF (congestive heart failure) (HCC)    EF 30 to 35 % as of 09/2014.    CKD (chronic kidney disease), stage III ([place])    Complication of anesthesia 10/2014   "had to have defibrillator w/ERCP"   COPD (chronic obstructive pulmonary disease) ([place])    a. followed by pulmonary, COPD GOLD stage II   Depression    Diverticulosis of colon 07/2014   noted on CT   GERD (gastroesophageal reflux disease)    Hiatal hernia    Hyperglycemia 10/2012.   Hyperlipidemia    Hypertension    Myocardial infarction [facility]) 1994; 2011   Pneumonia 1946; 2015   Prostate enlargement 07/2014   observed on CT   Tobacco abuse    Ventricular tachycardia ([place])    a. 08/2009 s/p BSX E110 Teligen 100 AICD, ser#: [id number];  b. 08/2008 VT  req ATP - detection reprogrammed from 160 to 150. c. EPS and VT ablation by Dr. Lovena Le 12/21/2014   Past Surgical History:  Procedure Laterality Date   BIOPSY  12/21/2017   Procedure: BIOPSY;  Surgeon: [name], MD;  Location: [facility];  Service: Endoscopy;;   CATARACT EXTRACTION W/ INTRAOCULAR LENS  IMPLANT, BILATERAL Bilateral ~ 2011   COLONOSCOPY     COLONOSCOPY WITH PROPOFOL N/A 12/21/2017   Procedure: COLONOSCOPY WITH PROPOFOL;  Surgeon: [name], MD;  Location: [facility];  Service: Endoscopy;  Laterality: N/A;   ELECTROPHYSIOLOGIC STUDY N/A 12/21/2014   Procedure: V Tach Ablation;  Surgeon: [name], MD;  Location: [place] [facility];  Service: Cardiovascular;  Laterality: N/A;   ERCP N/A 11/16/2014   Procedure: ENDOSCOPIC RETROGRADE CHOLANGIOPANCREATOGRAPHY (ERCP);  Surgeon: [name], MD;  Location: [place];  Service: Endoscopy;  Laterality: N/A;   ESOPHAGOGASTRODUODENOSCOPY (EGD) WITH PROPOFOL N/A 12/21/2017   Procedure: ESOPHAGOGASTRODUODENOSCOPY (EGD) WITH PROPOFOL;  Surgeon: [name], MD;  [facility]: [facility];  Service: Endoscopy;  Laterality: N/A;   EYE SURGERY     FOOT SURGERY Left 2005   "  fixed bone that stuck out in my ankle area"   [place]  09/06/09   BSX dual chamber ICD implanted in [place] for cardiac arrest 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   [place] Right ~ [place] N/A 11/25/2012   demonstrated EF 30%, inferior akinesis with mild hypokinesis of all walls, patent LAD and RCA stents; ostial PDA with 80-90% obstruction with medical therapy recommended   MOHS SURGERY  2008   nose, skin graft   POLYPECTOMY  12/21/2017   Procedure: POLYPECTOMY;  Surgeon: [name], MD;  Location: [facility];  Service: Endoscopy;;   RETINAL DETACHMENT SURGERY Right 2013   TENOLYSIS Right 12/21/2013   Procedure: TENOLYSIS FLEXOR CARPI  RADIALIS ,DEBRIDEMENT RIGHT JOINT WRIST,DEBRIDEMENT SCAPHOTRAPEZIAL TRAPEZOID, REPAIR OF EXTENSOR HOOD;  Surgeon: Daryll Brod, MD;  Location: [place];  Service: Orthopedics;  Laterality: Right;   TOE SURGERY Right 09/2019   3rd toe/hammer toe   V-TACH ABLATION  12/21/2014   VIDEO BRONCHOSCOPY Bilateral 01/09/2016   Procedure: VIDEO BRONCHOSCOPY WITHOUT FLUORO;  Surgeon: Juanito Doom, MD;  Location: [facility];  Service: Cardiopulmonary;  Laterality: Bilateral;   Patient Active Problem List   Diagnosis Date Noted   Major depressive disorder with single episode, in partial remission ([place]) 03/05/2020   Paraseptal emphysema ([place]) 12/15/2018   Tobacco abuse 12/15/2018   Perianal abscess 11/15/2018   Left inguinal hernia 11/15/2018   Leg wound, right 11/15/2018   Weight loss 09/29/2018   Dark stools 09/29/2018   Lumbar trigger point syndrome 04/20/2018   Throat and mouth symptom 02/03/2018   Gastroesophageal reflux disease without esophagitis    Esophageal stricture    Gastritis and gastroduodenitis    Degenerative cervical spinal stenosis 10/27/2017   Carotid bruit 10/27/2017   B12 deficiency 08/25/2017   Anemia 06/10/2017   Right ankle pain 04/09/2017   BPH (benign prostatic hyperplasia) 12/31/2016   Dizzinesses 05/22/2016   Mass of throat 06/30/2015   Depression 05/24/2015   Atrial fibrillation ([place]) 03/15/2015   Routine general medical examination at a health care facility 05/12/2014   Hemoptysis 03/16/2014   COPD GOLD GRADE C 74/94/4967   Chronic systolic heart failure ([place]) 07/07/2013   CAD (coronary artery disease) 11/23/2012   Smokers' cough ([place]) 11/23/2012   Ventricular tachycardia ([place]) 11/23/2012   Essential hypertension 11/23/2012   Hyperlipidemia 11/23/2012   ICD (implantable cardioverter-defibrillator) in place 11/23/2012    ONSET DATE: ~6-8 months ago  REFERRING DIAG: Frequent falls  THERAPY DIAG:  Cervicalgia  Abnormal  posture  Muscle weakness (generalized)  Other abnormalities of gait and mobility  Rationale for Evaluation and Treatment Rehabilitation  SUBJECTIVE:   SUBJECTIVE STATEMENT: The patient reports his legs swelled after the fall he had last week.  He has seen at ED and got a dressing (dx with cellulitis).  He is now taking antibiotics. The patient was really sore after last session. He did not work on [employer] since Friday.  Pt accompanied by: self  PERTINENT HISTORY: Vertigo, 2 dislocated shoulders, back pain since 2012 Pt reports history of vertigo but mostly complains of feelings of soreness in head/neck (ongoing 6-8 months). Feels this may be related to his balance/falls. Did not have to use cane in the house but has needed to because of back pain (this has been chronic but worse lately).   PAIN:  Are you having pain? Yes: NPRS scale:  /10 Pain  location: headache Pain description: headache and neck pain  Aggravating factors: movement Relieving factors: unsure *Patient is sure he did not hit his head with fall this week.  PRECAUTIONS: Fall  PATIENT GOALS Improve neck pain so he doesn't have to sleep on a heating pad  OBJECTIVE:    Cervical ROM:    Active A/PROM (deg) eval 12/12/21 12/23/21  Flexion 25 55   Extension 25 (sore pain) 20 - no pain   Right lateral flexion 24 25   Left lateral flexion 18 30   Right rotation 30  40  Left rotation 35  44  (Blank rows = not tested)  UPPER EXTREMITY MMT:  MMT Right eval Left eval  Shoulder flexion 5 5  Shoulder extension 5 5  Shoulder abduction 5 5  Shoulder adduction    Shoulder internal rotation 5 5  Shoulder external rotation 3+ 3+  Middle trapezius 3+ 3+  Lower trapezius 3+ 3+   (Blank rows = not tested)   LOWER EXTREMITY MMT:   MMT Right eval Left eval  Hip flexion 5 5  Knee flexion 4+ 5  Knee extension 5 5  (Blank rows = not tested)   TODAY'S TREATMENT 12/29/21: THEREX UBE standing level 2 x 2 minutes  forward/2 minutes backward Standing wall lean with scapular protraction/retraction x 10 reps Thoracic opening with wall lean x 10 reps Horizontal abduction x 15 reps with coregous ball for postural cues (near wall) Standing diagonals with coregous ball for postural cues (near wall) Rows x 12 reps blue theraband Shoulder extension x 12 reps blue theraband Chin tucks into coregous ball x 10 reps Sidelying thoracic opening Supine horizontal ab/adduction x 15 reps no resistance Supine spinal twist x 3 reps holding 20 seconds  Sit<>stand x 10 reps with W   MANUAL PROM stretching neck with STM scalenes and suboccipitals   12/26/21: THEREX UBE X LEVEL 2 x 2 minutes forward/2 minutes backwards Supine Horizontal abduction shoulders green band x 12 reps x 2 sets Neck A/ROM with isometrics at end range rotation R and L sides Coregous ball stretch for anterior chest  Standing Cervical chin tucks with tactile and demo cues x 10 reps Ls with coregous ball x 10 reps Ls adding green band x 10 reps Shoulder horiz abduction green band x 10 reps  MANUAL Supine STM cervical spine, suboccipitals, scalenes, SCM, upper trap, pectoralis minor  12/23/21 UBE L4 x 4 min alt fwd/bkwd  Standing: Doorway pec stretch mid 2 x 30 sec "T", "I" green band 2 x 10 "Y" red TB x 12  Seated: Cervical ext with towel 2 x 20 sec Cervical rotation 2 x 20 sec bilat Cervical lateral flexion 2 x 20 sec bilat  Manual: STM and TPR cervical and thoracic paraspinals, bilat UT Cervical - Thoracic PA mobs grade II to III   PATIENT EDUCATION: Education details: HEP review Person educated: Patient Education method: Explanation, Demonstration, and Handouts Education comprehension: verbalized understanding and returned demonstration  Access Code: [email or web address] URL: [email or web address] Date: --REPRINTED ON 12/26/21 Prepared by: Estill Bamberg April Thurnell Garbe  Exercises - Doorway Pec Stretch at 60 Elevation   - 1 x daily - 7 x weekly - 2 sets - 30 sec hold - Doorway Pec Stretch at 90 Degrees Abduction  - 1 x daily - 7 x weekly - 2 sets - 30 sec hold - Standing Scapular Retraction  - 1 x daily - 7 x weekly - 2 sets - 10 reps - Cervical Retraction at [place]  -  1 x daily - 7 x weekly - 1 sets - 10 reps - Shoulder External Rotation and Scapular Retraction  - 1 x daily - 7 x weekly - 2 sets - 10 reps - Shoulder External Rotation in 45 Degrees Abduction  - 1 x daily - 7 x weekly - 2 sets - 10 reps  GOALS: Goals reviewed with patient? Yes  SHORT TERM GOALS: Target date: 12/24/2021   Pt will be ind with initial HEP Goal status: PARTIALLY MET-- the patient reports he does not do regularly  2.  Pt will report at least 25% decrease in neck/head pain Goal status: MET  LONG TERM GOALS: Target date: 01/14/2022    Pt will be ind with final HEP and transition to community wellness Goal status: IN PROGRESS  2.  Pt will report no longer needing heating pad to sleep per his goal Goal status:IN PROGRESS  3.  Pt will demo at least 20/24 DGI to demo decreased risk of falls Goal status: IN PROGRESS  4.  Pt will demo at least 5-10 deg improvement in C-spine ROM for driving tasks Goal status: ACHIEVED* per chart   5.  Pt will demo at least 4/5 strength in posterior shoulder girdle to demo improved postural stability for reduced neck/back pain with amb Goal status: IN PROGRESS   ASSESSMENT:  CLINICAL IMPRESSION: The patient is not carrying over HEP.  PT is focusing on strengthening posterior shoulder girdle muscles, posture, and balance.  Continue working to [employer].    From eval: Patient is an 81 y.o. M who was seen today for physical therapy evaluation and treatment for neck/head pain with referral for falls and unstable gait. PMH significant for 1 recent fall, chronic back pain and history of BPPV likely adding to his current deficits. Assessment significant for poor postural stability with decreased  posterior shoulder girdle strength, reduced cervical and thoracic ROM, multiple cervical muscle trigger points all leading towards reduced dynamic balance and pain affecting community and home activities. Pt would benefit from PT to address these issues.     PLAN: PT FREQUENCY: 2x/week  PT DURATION: 6 weeks  PLANNED INTERVENTIONS: Therapeutic exercises, Therapeutic activity, Neuromuscular re-education, Balance training, Gait training, Patient/Family education, Self Care, Joint mobilization, Vestibular training, Dry Needling, Spinal mobilization, Cryotherapy, Moist heat, Taping, Traction, Ionotophoresis 52m/ml Dexamethasone, Manual therapy, and Re-evaluation  PLAN FOR NEXT SESSION: 10TH VISIT PROGRESS NOTE NEXT VISIT, manual as indicated. Continue postural strengthening and balance.    [place] [facility] 12/29/2021, 2:05 PM

## 2022-01-01 ENCOUNTER — Encounter: Payer: Self-pay | Admitting: Physical Therapy

## 2022-01-01 ENCOUNTER — Ambulatory Visit: Payer: Medicare Other | Admitting: Physical Therapy

## 2022-01-01 DIAGNOSIS — R2689 Other abnormalities of gait and mobility: Secondary | ICD-10-CM

## 2022-01-01 DIAGNOSIS — M6281 Muscle weakness (generalized): Secondary | ICD-10-CM

## 2022-01-01 DIAGNOSIS — M542 Cervicalgia: Secondary | ICD-10-CM | POA: Diagnosis not present

## 2022-01-01 DIAGNOSIS — R293 Abnormal posture: Secondary | ICD-10-CM

## 2022-01-01 NOTE — Therapy (Signed)
OUTPATIENT PHYSICAL THERAPY TREATMENT     Patient Name: Steven Guzman MRN: [id number] DOB:1941/01/13, 81 y.o., male Today's Date: 01/01/2022  PCP: [name] NP REFERRING PROVIDER: [name] NP   PT End of Session - 01/01/22 1449     Visit Number 10    Number of Visits 12    Date for PT Re-Evaluation 01/14/22    Authorization - Visit Number 10    Progress Note Due on Visit 10    PT Start Time 8295    PT Stop Time 1530    PT Time Calculation (min) 41 min    Activity Tolerance Patient tolerated treatment well    Behavior During Therapy WFL for tasks assessed/performed                Past Medical History:  Diagnosis Date   AICD (automatic cardioverter/defibrillator) present    Aneurysm ([place])    a. Aneurysmal infrarenal aorta up to 33 mm on CT 10/2014, recommended f/u due 10/2017   Anginal pain ([place])    Anxiety    Basal cell carcinoma of nose    S/P MOHS   Biliary acute pancreatitis    CAD (coronary artery disease)    a. s/p MI in 1994 with PCI to LAD at that time b. cath 10/2012 demonstrated EF 30%, inferior akinesis with mild hypokinesis of all walls, patent LAD and RCA stents; ostial PDA with 80-90% obstruction with medical therapy recommended    Chronic systolic CHF (congestive heart failure) (HCC)    EF 30 to 35 % as of 09/2014.    CKD (chronic kidney disease), stage III ([place])    Complication of anesthesia 10/2014   "had to have defibrillator w/ERCP"   COPD (chronic obstructive pulmonary disease) ([place])    a. followed by pulmonary, COPD GOLD stage II   Depression    Diverticulosis of colon 07/2014   noted on CT   GERD (gastroesophageal reflux disease)    Hiatal hernia    Hyperglycemia 10/2012.   Hyperlipidemia    Hypertension    Myocardial infarction [facility]) 1994; 2011   Pneumonia 1946; 2015   Prostate enlargement 07/2014   observed on CT   Tobacco abuse    Ventricular tachycardia ([place])    a. 08/2009 s/p BSX E110 Teligen 100 AICD, ser#: [id number];  b. 08/2008  VT req ATP - detection reprogrammed from 160 to 150. c. EPS and VT ablation by Dr. Lovena Le 12/21/2014   Past Surgical History:  Procedure Laterality Date   BIOPSY  12/21/2017   Procedure: BIOPSY;  Surgeon: [name], MD;  Location: [facility];  Service: Endoscopy;;   CATARACT EXTRACTION W/ INTRAOCULAR LENS  IMPLANT, BILATERAL Bilateral ~ 2011   COLONOSCOPY     COLONOSCOPY WITH PROPOFOL N/A 12/21/2017   Procedure: COLONOSCOPY WITH PROPOFOL;  Surgeon: [name], MD;  Location: [facility];  Service: Endoscopy;  Laterality: N/A;   ELECTROPHYSIOLOGIC STUDY N/A 12/21/2014   Procedure: V Tach Ablation;  Surgeon: [name], MD;  Location: [facility];  Service: Cardiovascular;  Laterality: N/A;   ERCP N/A 11/16/2014   Procedure: ENDOSCOPIC RETROGRADE CHOLANGIOPANCREATOGRAPHY (ERCP);  Surgeon: [name], MD;  Location: [facility];  Service: Endoscopy;  Laterality: N/A;   ESOPHAGOGASTRODUODENOSCOPY (EGD) WITH PROPOFOL N/A 12/21/2017   Procedure: ESOPHAGOGASTRODUODENOSCOPY (EGD) WITH PROPOFOL;  Surgeon: [name], MD;  [facility];  Service: Endoscopy;  Laterality: N/A;   EYE SURGERY     FOOT SURGERY Left 2005   "  fixed bone that stuck out in my ankle area"   [name]  09/06/09   BSX dual chamber ICD implanted in [place] for cardiac arrest 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   [place] Right ~ [place] N/A 11/25/2012   demonstrated EF 30%, inferior akinesis with mild hypokinesis of all walls, patent LAD and RCA stents; ostial PDA with 80-90% obstruction with medical therapy recommended   MOHS SURGERY  2008   nose, skin graft   POLYPECTOMY  12/21/2017   Procedure: POLYPECTOMY;  Surgeon: [name], MD;  [facility];  Service: Endoscopy;;   RETINAL DETACHMENT SURGERY Right 2013   TENOLYSIS Right 12/21/2013   Procedure: TENOLYSIS FLEXOR  CARPI RADIALIS ,DEBRIDEMENT RIGHT JOINT WRIST,DEBRIDEMENT SCAPHOTRAPEZIAL TRAPEZOID, REPAIR OF EXTENSOR HOOD;  Surgeon: Daryll Brod, MD;  Location: [facility];  Service: Orthopedics;  Laterality: Right;   TOE SURGERY Right 09/2019   3rd toe/hammer toe   V-TACH ABLATION  12/21/2014   VIDEO BRONCHOSCOPY Bilateral 01/09/2016   Procedure: VIDEO BRONCHOSCOPY WITHOUT FLUORO;  Surgeon: Juanito Doom, MD;  [facility];  Service: Cardiopulmonary;  Laterality: Bilateral;   Patient Active Problem List   Diagnosis Date Noted   Major depressive disorder with single episode, in partial remission ([place]) 03/05/2020   Paraseptal emphysema ([place]) 12/15/2018   Tobacco abuse 12/15/2018   Perianal abscess 11/15/2018   Left inguinal hernia 11/15/2018   Leg wound, right 11/15/2018   Weight loss 09/29/2018   Dark stools 09/29/2018   Lumbar trigger point syndrome 04/20/2018   Throat and mouth symptom 02/03/2018   Gastroesophageal reflux disease without esophagitis    Esophageal stricture    Gastritis and gastroduodenitis    Degenerative cervical spinal stenosis 10/27/2017   Carotid bruit 10/27/2017   B12 deficiency 08/25/2017   Anemia 06/10/2017   Right ankle pain 04/09/2017   BPH (benign prostatic hyperplasia) 12/31/2016   Dizzinesses 05/22/2016   Mass of throat 06/30/2015   Depression 05/24/2015   Atrial fibrillation ([name]) 03/15/2015   Routine general medical examination at a health care facility 05/12/2014   Hemoptysis 03/16/2014   COPD GOLD GRADE C 88/75/7972   Chronic systolic heart failure ([place]) 07/07/2013   CAD (coronary artery disease) 11/23/2012   Smokers' cough ([place]) 11/23/2012   Ventricular tachycardia ([place]) 11/23/2012   Essential hypertension 11/23/2012   Hyperlipidemia 11/23/2012   ICD (implantable cardioverter-defibrillator) in place 11/23/2012    ONSET DATE: ~6-8 months ago  REFERRING DIAG: Frequent falls  THERAPY DIAG:  Cervicalgia  Abnormal  posture  Muscle weakness (generalized)  Other abnormalities of gait and mobility  Rationale for Evaluation and Treatment Rehabilitation  SUBJECTIVE:   SUBJECTIVE STATEMENT: Pt reports he did 3 pages of exercises last night. Pt states he did have a slight headache after the exercises.   Pt accompanied by: self  PERTINENT HISTORY: Vertigo, 2 dislocated shoulders, back pain since 2012 Pt reports history of vertigo but mostly complains of feelings of soreness in head/neck (ongoing 6-8 months). Feels this may be related to his balance/falls. Did not have to use cane in the house but has needed to because of back pain (this has been chronic but worse lately).   PAIN:  Are you having pain? Yes: NPRS scale: 0/10 Pain location: headache Pain description: headache and neck pain  Aggravating factors: movement Relieving factors: unsure *Patient is sure he did not hit his head with fall this  week.  PRECAUTIONS: Fall  PATIENT GOALS Improve neck pain so he doesn't have to sleep on a heating pad  OBJECTIVE:    Cervical ROM:    Active A/PROM (deg) eval 12/12/21 12/23/21  Flexion 25 55   Extension 25 (sore pain) 20 - no pain   Right lateral flexion 24 25   Left lateral flexion 18 30   Right rotation 30  40  Left rotation 35  44  (Blank rows = not tested)  UPPER EXTREMITY MMT:  MMT Right eval Left eval  Shoulder flexion 5 5  Shoulder extension 5 5  Shoulder abduction 5 5  Shoulder adduction    Shoulder internal rotation 5 5  Shoulder external rotation 3+ 3+  Middle trapezius 3+ 3+  Lower trapezius 3+ 3+   (Blank rows = not tested)   LOWER EXTREMITY MMT:   MMT Right eval Left eval  Hip flexion 5 5  Knee flexion 4+ 5  Knee extension 5 5  (Blank rows = not tested)   [facility] PT Assessment - 01/01/22 0001       Dynamic Gait Index   Level Surface Mild Impairment    Change in Gait Speed Normal    Gait with Horizontal Head Turns Mild Impairment    Gait with Vertical Head  Turns Mild Impairment    Gait and Pivot Turn Mild Impairment    Step Over Obstacle Mild Impairment   used cane   Step Around Obstacles Normal    Steps Normal    Total Score 19    DGI comment: 20/24              TODAY'S TREATMENT 01/01/22: THEREX UBE sitting L4; 2 min fwd, 2 min bwd Standing Doorway pec stretch x30 sec mid & high Chin tucks into corgeous ball x10 Snow angel 2x10 Shoulder ext blue TB 2x10 Row blue TB 2x10 "Y" x10  Sitting Shoulder ER green TB 2x10 W green TB 2x10 Shoulder abd green TB 2x10 Thoracic rotation   MANUAL THERAPY Seated thoracic extension x10; thoracic rotation x10  12/29/21: THEREX UBE standing level 2 x 2 minutes forward/2 minutes backward Standing wall lean with scapular protraction/retraction x 10 reps Thoracic opening with wall lean x 10 reps Horizontal abduction x 15 reps with coregous ball for postural cues (near wall) Standing diagonals with coregous ball for postural cues (near wall) Rows x 12 reps blue theraband Shoulder extension x 12 reps blue theraband Chin tucks into coregous ball x 10 reps Sidelying thoracic opening Supine horizontal ab/adduction x 15 reps no resistance Supine spinal twist x 3 reps holding 20 seconds  Sit<>stand x 10 reps with W   MANUAL PROM stretching neck with STM scalenes and suboccipitals   12/26/21: THEREX UBE X LEVEL 2 x 2 minutes forward/2 minutes backwards Supine Horizontal abduction shoulders green band x 12 reps x 2 sets Neck A/ROM with isometrics at end range rotation R and L sides Coregous ball stretch for anterior chest  Standing Cervical chin tucks with tactile and demo cues x 10 reps Ls with coregous ball x 10 reps Ls adding green band x 10 reps Shoulder horiz abduction green band x 10 reps  MANUAL Supine STM cervical spine, suboccipitals, scalenes, SCM, upper trap, pectoralis minor  12/23/21 UBE L4 x 4 min alt fwd/bkwd  Standing: Doorway pec stretch mid 2 x 30 sec "T",  "I" green band 2 x 10 "Y" red TB x 12  Seated: Cervical ext with towel 2 x 20  sec Cervical rotation 2 x 20 sec bilat Cervical lateral flexion 2 x 20 sec bilat  Manual: STM and TPR cervical and thoracic paraspinals, bilat UT Cervical - Thoracic PA mobs grade II to III   PATIENT EDUCATION: Education details: HEP review Person educated: Patient Education method: Explanation, Demonstration, and Handouts Education comprehension: verbalized understanding and returned demonstration  Access Code: [email or web address] URL: [email or web address] Date: --REPRINTED ON 12/26/21 Prepared by: Estill Bamberg April Thurnell Garbe  Exercises - Doorway Pec Stretch at 60 Elevation  - 1 x daily - 7 x weekly - 2 sets - 30 sec hold - Doorway Pec Stretch at 90 Degrees Abduction  - 1 x daily - 7 x weekly - 2 sets - 30 sec hold - Standing Scapular Retraction  - 1 x daily - 7 x weekly - 2 sets - 10 reps - Cervical Retraction at Wall  - 1 x daily - 7 x weekly - 1 sets - 10 reps - Shoulder External Rotation and Scapular Retraction  - 1 x daily - 7 x weekly - 2 sets - 10 reps - Shoulder External Rotation in 45 Degrees Abduction  - 1 x daily - 7 x weekly - 2 sets - 10 reps  GOALS: Goals reviewed with patient? Yes  SHORT TERM GOALS: Target date: 12/24/2021   Pt will be ind with initial HEP Goal status: PARTIALLY MET-- the patient reports he does not do regularly  2.  Pt will report at least 25% decrease in neck/head pain Goal status: MET  LONG TERM GOALS: Target date: 01/14/2022    Pt will be ind with final HEP and transition to community wellness Goal status: IN PROGRESS  2.  Pt will report no longer needing heating pad to sleep per his goal Goal status:IN PROGRESS  3.  Pt will demo at least 20/24 DGI to demo decreased risk of falls Goal status: IN PROGRESS  4.  Pt will demo at least 5-10 deg improvement in C-spine ROM for driving tasks Goal status: ACHIEVED* per chart   5.  Pt will demo at least 4/5  strength in posterior shoulder girdle to demo improved postural stability for reduced neck/back pain with amb Goal status: IN PROGRESS   ASSESSMENT:  CLINICAL IMPRESSION: 01/01/22: Pt continues to be inconsistent with his HEP. Continued to work on postural strengthening and standing stability. Pt is demonstrating improving DGI but mid way through exam requested to use cane for safety. Pt has met DGI goal but remains unsteady with head turns and pivot turns.   From eval: Patient is an 81 y.o. M who was seen today for physical therapy evaluation and treatment for neck/head pain with referral for falls and unstable gait. PMH significant for 1 recent fall, chronic back pain and history of BPPV likely adding to his current deficits. Assessment significant for poor postural stability with decreased posterior shoulder girdle strength, reduced cervical and thoracic ROM, multiple cervical muscle trigger points all leading towards reduced dynamic balance and pain affecting community and home activities. Pt would benefit from PT to address these issues.     PLAN: PT FREQUENCY: 2x/week  PT DURATION: 6 weeks  PLANNED INTERVENTIONS: Therapeutic exercises, Therapeutic activity, Neuromuscular re-education, Balance training, Gait training, Patient/Family education, Self Care, Joint mobilization, Vestibular training, Dry Needling, Spinal mobilization, Cryotherapy, Moist heat, Taping, Traction, Ionotophoresis 46m/ml Dexamethasone, Manual therapy, and Re-evaluation  PLAN FOR NEXT SESSION: manual as indicated. Continue postural strengthening and balance. Work on dDietitian Consider foam exercises.    Andra Heslin April  Gordy Levan, PT, DPT 01/01/2022, 3:37 PM

## 2022-01-05 ENCOUNTER — Ambulatory Visit: Payer: Medicare Other | Admitting: Physical Therapy

## 2022-01-05 ENCOUNTER — Encounter: Payer: Self-pay | Admitting: Physical Therapy

## 2022-01-05 DIAGNOSIS — M6281 Muscle weakness (generalized): Secondary | ICD-10-CM

## 2022-01-05 DIAGNOSIS — M542 Cervicalgia: Secondary | ICD-10-CM | POA: Diagnosis not present

## 2022-01-05 DIAGNOSIS — R293 Abnormal posture: Secondary | ICD-10-CM

## 2022-01-05 DIAGNOSIS — R2689 Other abnormalities of gait and mobility: Secondary | ICD-10-CM

## 2022-01-05 NOTE — Therapy (Addendum)
OUTPATIENT PHYSICAL THERAPY TREATMENT AND DISCHARGE     Patient Name: Steven Guzman MRN: [id number] DOB:02-08-1941, 81 y.o., male Today's Date: 01/05/2022  PCP: [name] NP REFERRING PROVIDER: [name] NP  PHYSICAL THERAPY DISCHARGE SUMMARY  Visits from Start of Care: 11  Current functional level related to goals / functional outcomes: See below   Remaining deficits: See below   Education / Equipment: See below   Patient agrees to discharge. Patient goals were partially met. Patient is being discharged due to not returning since the last visit.    PT End of Session - 01/05/22 1443     Visit Number 11    Number of Visits 12    Date for PT Re-Evaluation 01/14/22    Authorization - Visit Number 11    Progress Note Due on Visit 10    PT Start Time 7209    PT Stop Time 1530    PT Time Calculation (min) 45 min    Activity Tolerance Patient tolerated treatment well    Behavior During Therapy WFL for tasks assessed/performed                Past Medical History:  Diagnosis Date   AICD (automatic cardioverter/defibrillator) present    Aneurysm ([place])    a. Aneurysmal infrarenal aorta up to 33 mm on CT 10/2014, recommended f/u due 10/2017   Anginal pain ([place])    Anxiety    Basal cell carcinoma of nose    S/P MOHS   Biliary acute pancreatitis    CAD (coronary artery disease)    a. s/p MI in 1994 with PCI to LAD at that time b. cath 10/2012 demonstrated EF 30%, inferior akinesis with mild hypokinesis of all walls, patent LAD and RCA stents; ostial PDA with 80-90% obstruction with medical therapy recommended    Chronic systolic CHF (congestive heart failure) (HCC)    EF 30 to 35 % as of 09/2014.    CKD (chronic kidney disease), stage III ([place])    Complication of anesthesia 10/2014   "had to have defibrillator w/ERCP"   COPD (chronic obstructive pulmonary disease) ([place])    a. followed by pulmonary, COPD GOLD stage II   Depression    Diverticulosis of colon  07/2014   noted on CT   GERD (gastroesophageal reflux disease)    Hiatal hernia    Hyperglycemia 10/2012.   Hyperlipidemia    Hypertension    Myocardial infarction [facility]) 1994; 2011   Pneumonia 1946; 2015   Prostate enlargement 07/2014   observed on CT   Tobacco abuse    Ventricular tachycardia ([place])    a. 08/2009 s/p BSX E110 Teligen 100 AICD, ser#: [id number];  b. 08/2008 VT req ATP - detection reprogrammed from 160 to 150. c. EPS and VT ablation by Dr. Lovena Le 12/21/2014   Past Surgical History:  Procedure Laterality Date   BIOPSY  12/21/2017   Procedure: BIOPSY;  Surgeon: [name], MD;  Location: [facility];  Service: Endoscopy;;   CATARACT EXTRACTION W/ INTRAOCULAR LENS  IMPLANT, BILATERAL Bilateral ~ 2011   COLONOSCOPY     COLONOSCOPY WITH PROPOFOL N/A 12/21/2017   Procedure: COLONOSCOPY WITH PROPOFOL;  Surgeon: [name], MD;  Location: [facility];  Service: Endoscopy;  Laterality: N/A;   ELECTROPHYSIOLOGIC STUDY N/A 12/21/2014   Procedure: V Tach Ablation;  Surgeon: [name], MD;  Location: [place] [facility];  Service: Cardiovascular;  Laterality: N/A;   ERCP N/A 11/16/2014   Procedure: ENDOSCOPIC  RETROGRADE CHOLANGIOPANCREATOGRAPHY (ERCP);  Surgeon: [name], MD;  Location: [place];  Service: Endoscopy;  Laterality: N/A;   ESOPHAGOGASTRODUODENOSCOPY (EGD) WITH PROPOFOL N/A 12/21/2017   Procedure: ESOPHAGOGASTRODUODENOSCOPY (EGD) WITH PROPOFOL;  Surgeon: [name], MD;  [facility];  Service: Endoscopy;  Laterality: N/A;   EYE SURGERY     FOOT SURGERY Left 2005   "fixed bone that stuck out in my ankle area"   HEMORRHOID BANDING     IMPLANTABLE CARDIOVERTER DEFIBRILLATOR IMPLANT  09/06/09   BSX dual chamber ICD implanted in [place] for cardiac arrest 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   [place] Right ~ [place] N/A 11/25/2012   demonstrated EF 30%, inferior akinesis with mild hypokinesis of  all walls, patent LAD and RCA stents; ostial PDA with 80-90% obstruction with medical therapy recommended   MOHS SURGERY  2008   nose, skin graft   POLYPECTOMY  12/21/2017   Procedure: POLYPECTOMY;  Surgeon: [name], MD;  [facility];  Service: Endoscopy;;   RETINAL DETACHMENT SURGERY Right 2013   TENOLYSIS Right 12/21/2013   Procedure: TENOLYSIS FLEXOR CARPI RADIALIS ,DEBRIDEMENT RIGHT JOINT WRIST,DEBRIDEMENT SCAPHOTRAPEZIAL TRAPEZOID, REPAIR OF EXTENSOR HOOD;  Surgeon: Daryll Brod, MD;  Location: [place];  Service: Orthopedics;  Laterality: Right;   TOE SURGERY Right 09/2019   3rd toe/hammer toe   V-TACH ABLATION  12/21/2014   VIDEO BRONCHOSCOPY Bilateral 01/09/2016   Procedure: VIDEO BRONCHOSCOPY WITHOUT FLUORO;  Surgeon: Juanito Doom, MD;  [facility];  Service: Cardiopulmonary;  Laterality: Bilateral;   Patient Active Problem List   Diagnosis Date Noted   Major depressive disorder with single episode, in partial remission ([place]) 03/05/2020   Paraseptal emphysema ([place]) 12/15/2018   Tobacco abuse 12/15/2018   Perianal abscess 11/15/2018   Left inguinal hernia 11/15/2018   Leg wound, right 11/15/2018   Weight loss 09/29/2018   Dark stools 09/29/2018   Lumbar trigger point syndrome 04/20/2018   Throat and mouth symptom 02/03/2018   Gastroesophageal reflux disease without esophagitis    Esophageal stricture    Gastritis and gastroduodenitis    Degenerative cervical spinal stenosis 10/27/2017   Carotid bruit 10/27/2017   B12 deficiency 08/25/2017   Anemia 06/10/2017   Right ankle pain 04/09/2017   BPH (benign prostatic hyperplasia) 12/31/2016   Dizzinesses 05/22/2016   Mass of throat 06/30/2015   Depression 05/24/2015   Atrial fibrillation ([place]) 03/15/2015   Routine general medical examination at a health care facility 05/12/2014   Hemoptysis 03/16/2014   COPD GOLD GRADE C 90/38/3338   Chronic systolic heart failure ([place])  07/07/2013   CAD (coronary artery disease) 11/23/2012   Smokers' cough ([place]) 11/23/2012   Ventricular tachycardia ([place]) 11/23/2012   Essential hypertension 11/23/2012   Hyperlipidemia 11/23/2012   ICD (implantable cardioverter-defibrillator) in place 11/23/2012    ONSET DATE: ~6-8 months ago  REFERRING DIAG: Frequent falls  THERAPY DIAG:  Cervicalgia  Abnormal posture  Muscle weakness (generalized)  Other abnormalities of gait and mobility  Rationale for Evaluation and Treatment Rehabilitation  SUBJECTIVE:   SUBJECTIVE STATEMENT: Pt states he once again didn't do his HEP. Pt reports pain started going away after some moving. Pt has continued to sleep on the heating pad. Pt does note that the frequency and intensity of pain is decreasing.   Pt accompanied by: self  PERTINENT HISTORY: Vertigo, 2 dislocated shoulders, back pain since 2012 Pt reports history of vertigo but mostly  complains of feelings of soreness in head/neck (ongoing 6-8 months). Feels this may be related to his balance/falls. Did not have to use cane in the house but has needed to because of back pain (this has been chronic but worse lately).   PAIN:  Are you having pain? Yes: NPRS scale: 0/10 Pain location: headache Pain description: headache and neck pain  Aggravating factors: movement Relieving factors: unsure *Patient is sure he did not hit his head with fall this week.  PRECAUTIONS: Fall  PATIENT GOALS Improve neck pain so he doesn't have to sleep on a heating pad  OBJECTIVE:    Cervical ROM:    Active A/PROM (deg) eval 12/12/21 12/23/21  Flexion 25 55   Extension 25 (sore pain) 20 - no pain   Right lateral flexion 24 25   Left lateral flexion 18 30   Right rotation 30  40  Left rotation 35  44  (Blank rows = not tested)  UPPER EXTREMITY MMT:  MMT Right eval Left eval  Shoulder flexion 5 5  Shoulder extension 5 5  Shoulder abduction 5 5  Shoulder adduction    Shoulder internal  rotation 5 5  Shoulder external rotation 3+ 3+  Middle trapezius 3+ 3+  Lower trapezius 3+ 3+   (Blank rows = not tested)   LOWER EXTREMITY MMT:   MMT Right eval Left eval  Hip flexion 5 5  Knee flexion 4+ 5  Knee extension 5 5  (Blank rows = not tested)      TODAY'S TREATMENT 01/05/22: THEREX UBE sitting L4; 2 min fwd, 2 min bwd Standing Doorway pec stretch 2x30 sec mid & high Shoulder ER blue TB 2x10 W blue TB 2x10 Shoulder horizontal abduction blue TB 2x10 Y blue TB x10, green TB x10  NMED 2 laps around gym performing the following: Head turns, head nods, backwards walking   01/01/22: THEREX UBE sitting L4; 2 min fwd, 2 min bwd Standing Doorway pec stretch x30 sec mid & high Chin tucks into corgeous ball x10 Snow angel 2x10 Shoulder ext blue TB 2x10 Row blue TB 2x10 "Y" x10  Sitting Shoulder ER green TB 2x10 W green TB 2x10 Shoulder abd green TB 2x10 Thoracic rotation   MANUAL THERAPY Seated thoracic extension x10; thoracic rotation x10  12/29/21: THEREX UBE standing level 2 x 2 minutes forward/2 minutes backward Standing wall lean with scapular protraction/retraction x 10 reps Thoracic opening with wall lean x 10 reps Horizontal abduction x 15 reps with coregous ball for postural cues (near wall) Standing diagonals with coregous ball for postural cues (near wall) Rows x 12 reps blue theraband Shoulder extension x 12 reps blue theraband Chin tucks into coregous ball x 10 reps Sidelying thoracic opening Supine horizontal ab/adduction x 15 reps no resistance Supine spinal twist x 3 reps holding 20 seconds  Sit<>stand x 10 reps with W   MANUAL PROM stretching neck with STM scalenes and suboccipitals    PATIENT EDUCATION: Education details: HEP review Person educated: Patient Education method: Explanation, Demonstration, and Handouts Education comprehension: verbalized understanding and returned demonstration  Access Code: [email or web address] URL:  [email or web address] Date: --REPRINTED ON 12/26/21 Prepared by: Estill Bamberg April Thurnell Garbe  Exercises - Doorway Pec Stretch at 60 Elevation  - 1 x daily - 7 x weekly - 2 sets - 30 sec hold - Doorway Pec Stretch at 90 Degrees Abduction  - 1 x daily - 7 x weekly - 2 sets - 30 sec hold - Standing Scapular Retraction  -  1 x daily - 7 x weekly - 2 sets - 10 reps - Cervical Retraction at Wall  - 1 x daily - 7 x weekly - 1 sets - 10 reps - Shoulder External Rotation and Scapular Retraction  - 1 x daily - 7 x weekly - 2 sets - 10 reps - Shoulder External Rotation in 45 Degrees Abduction  - 1 x daily - 7 x weekly - 2 sets - 10 reps  GOALS: Goals reviewed with patient? Yes  SHORT TERM GOALS: Target date: 12/24/2021   Pt will be ind with initial HEP Goal status: PARTIALLY MET-- the patient reports he does not do regularly  2.  Pt will report at least 25% decrease in neck/head pain Goal status: MET  LONG TERM GOALS: Target date: 01/14/2022    Pt will be ind with final HEP and transition to community wellness Goal status: IN PROGRESS  2.  Pt will report no longer needing heating pad to sleep per his goal Goal status:IN PROGRESS  3.  Pt will demo at least 20/24 DGI to demo decreased risk of falls Goal status: IN PROGRESS  4.  Pt will demo at least 5-10 deg improvement in C-spine ROM for driving tasks Goal status: ACHIEVED* per chart   5.  Pt will demo at least 4/5 strength in posterior shoulder girdle to demo improved postural stability for reduced neck/back pain with amb Goal status: IN PROGRESS   ASSESSMENT:  CLINICAL IMPRESSION: 01/05/22: Pt continues to be inconsistent with his HEP; however, is demonstrating increasing posterior shoulder girdle strength. Continued to work on postural strengthening and standing stability. Worked on improving dynamic balance. Still needs cueing to maintain upright posture and decrease forward head. PT to check goals next session. Pt would  like to continue PT until the end of the month.   From eval: Patient is an 81 y.o. M who was seen today for physical therapy evaluation and treatment for neck/head pain with referral for falls and unstable gait. PMH significant for 1 recent fall, chronic back pain and history of BPPV likely adding to his current deficits. Assessment significant for poor postural stability with decreased posterior shoulder girdle strength, reduced cervical and thoracic ROM, multiple cervical muscle trigger points all leading towards reduced dynamic balance and pain affecting community and home activities. Pt would benefit from PT to address these issues.     PLAN: PT FREQUENCY: 2x/week  PT DURATION: 6 weeks  PLANNED INTERVENTIONS: Therapeutic exercises, Therapeutic activity, Neuromuscular re-education, Balance training, Gait training, Patient/Family education, Self Care, Joint mobilization, Vestibular training, Dry Needling, Spinal mobilization, Cryotherapy, Moist heat, Taping, Traction, Ionotophoresis 44m/ml Dexamethasone, Manual therapy, and Re-evaluation  PLAN FOR NEXT SESSION: Re-check LTGs. Manual as indicated. Continue postural strengthening and balance. Work on dDietitian Consider foam exercises.    Alani Lacivita April MGordy Levan PT, DPT 01/05/2022, 2:43 PM

## 2022-01-06 ENCOUNTER — Other Ambulatory Visit: Payer: Self-pay | Admitting: Interventional Cardiology

## 2022-01-07 ENCOUNTER — Ambulatory Visit: Payer: Medicare Other | Admitting: Physical Therapy

## 2022-01-15 ENCOUNTER — Encounter: Payer: Self-pay | Admitting: Orthopedic Surgery

## 2022-01-15 ENCOUNTER — Encounter: Payer: Medicare Other | Admitting: Orthopedic Surgery

## 2022-01-16 NOTE — Progress Notes (Signed)
This encounter was created in error - please disregard.

## 2022-01-21 ENCOUNTER — Encounter: Payer: Self-pay | Admitting: Interventional Cardiology

## 2022-01-29 ENCOUNTER — Ambulatory Visit: Payer: Medicare Other | Admitting: Neurology

## 2022-02-06 DIAGNOSIS — S72001A Fracture of unspecified part of neck of right femur, initial encounter for closed fracture: Secondary | ICD-10-CM | POA: Insufficient documentation

## 2022-02-24 ENCOUNTER — Ambulatory Visit: Payer: Medicare Other

## 2022-02-24 ENCOUNTER — Ambulatory Visit (INDEPENDENT_AMBULATORY_CARE_PROVIDER_SITE_OTHER): Payer: Medicare Other

## 2022-02-24 DIAGNOSIS — I472 Ventricular tachycardia, unspecified: Secondary | ICD-10-CM

## 2022-02-24 LAB — CUP PACEART REMOTE DEVICE CHECK
Battery Remaining Longevity: 3 mo — CL
Battery Remaining Percentage: 2 %
Brady Statistic RA Percent Paced: 1 %
Brady Statistic RV Percent Paced: 0 %
Date Time Interrogation Session: 20231128175700
HighPow Impedance: 55 Ohm
Implantable Lead Connection Status: 753985
Implantable Lead Connection Status: 753985
Implantable Lead Implant Date: 20110610
Implantable Lead Implant Date: 20110610
Implantable Lead Location: 753859
Implantable Lead Location: 753860
Implantable Lead Model: 185
Implantable Lead Model: 4135
Implantable Lead Serial Number: 28681386
Implantable Lead Serial Number: 339643
Implantable Pulse Generator Implant Date: 20110610
Lead Channel Impedance Value: 424 Ohm
Lead Channel Impedance Value: 597 Ohm
Lead Channel Pacing Threshold Amplitude: 0.9 V
Lead Channel Pacing Threshold Amplitude: 1.1 V
Lead Channel Pacing Threshold Pulse Width: 0.4 ms
Lead Channel Pacing Threshold Pulse Width: 0.4 ms
Lead Channel Setting Pacing Amplitude: 2 V
Lead Channel Setting Pacing Amplitude: 2.2 V
Lead Channel Setting Pacing Pulse Width: 0.4 ms
Lead Channel Setting Sensing Sensitivity: 0.6 mV
Pulse Gen Serial Number: 164892

## 2022-02-25 ENCOUNTER — Other Ambulatory Visit: Payer: Self-pay

## 2022-02-25 ENCOUNTER — Telehealth: Payer: Self-pay

## 2022-02-25 ENCOUNTER — Encounter (HOSPITAL_COMMUNITY): Payer: Self-pay

## 2022-02-25 ENCOUNTER — Emergency Department (HOSPITAL_COMMUNITY)
Admission: EM | Admit: 2022-02-25 | Discharge: 2022-02-25 | Disposition: A | Discharge status: Home / Self Care | Payer: Medicare Other | Attending: Emergency Medicine | Admitting: Emergency Medicine

## 2022-02-25 ENCOUNTER — Emergency Department (HOSPITAL_COMMUNITY): Payer: Medicare Other

## 2022-02-25 DIAGNOSIS — D649 Anemia, unspecified: Secondary | ICD-10-CM | POA: Diagnosis not present

## 2022-02-25 DIAGNOSIS — U071 COVID-19: Secondary | ICD-10-CM | POA: Insufficient documentation

## 2022-02-25 DIAGNOSIS — J449 Chronic obstructive pulmonary disease, unspecified: Secondary | ICD-10-CM | POA: Diagnosis not present

## 2022-02-25 DIAGNOSIS — I509 Heart failure, unspecified: Secondary | ICD-10-CM | POA: Insufficient documentation

## 2022-02-25 DIAGNOSIS — R059 Cough, unspecified: Secondary | ICD-10-CM | POA: Diagnosis present

## 2022-02-25 DIAGNOSIS — I7143 Infrarenal abdominal aortic aneurysm, without rupture: Secondary | ICD-10-CM | POA: Insufficient documentation

## 2022-02-25 DIAGNOSIS — Z95810 Presence of automatic (implantable) cardiac defibrillator: Secondary | ICD-10-CM | POA: Diagnosis not present

## 2022-02-25 DIAGNOSIS — I2510 Atherosclerotic heart disease of native coronary artery without angina pectoris: Secondary | ICD-10-CM | POA: Diagnosis not present

## 2022-02-25 DIAGNOSIS — K4090 Unilateral inguinal hernia, without obstruction or gangrene, not specified as recurrent: Secondary | ICD-10-CM | POA: Insufficient documentation

## 2022-02-25 DIAGNOSIS — Z7901 Long term (current) use of anticoagulants: Secondary | ICD-10-CM | POA: Insufficient documentation

## 2022-02-25 DIAGNOSIS — R1013 Epigastric pain: Secondary | ICD-10-CM

## 2022-02-25 DIAGNOSIS — Z7951 Long term (current) use of inhaled steroids: Secondary | ICD-10-CM | POA: Diagnosis not present

## 2022-02-25 DIAGNOSIS — I4891 Unspecified atrial fibrillation: Secondary | ICD-10-CM | POA: Insufficient documentation

## 2022-02-25 LAB — CBC
HCT: 34.8 % — ABNORMAL LOW (ref 39.0–52.0)
Hemoglobin: 11.2 g/dL — ABNORMAL LOW (ref 13.0–17.0)
MCH: 34.7 pg — ABNORMAL HIGH (ref 26.0–34.0)
MCHC: 32.2 g/dL (ref 30.0–36.0)
MCV: 107.7 fL — ABNORMAL HIGH (ref 80.0–100.0)
Platelets: 194 10*3/uL (ref 150–400)
RBC: 3.23 MIL/uL — ABNORMAL LOW (ref 4.22–5.81)
RDW: 18.2 % — ABNORMAL HIGH (ref 11.5–15.5)
WBC: 4.1 10*3/uL (ref 4.0–10.5)
nRBC: 0 % (ref 0.0–0.2)

## 2022-02-25 LAB — URINALYSIS, ROUTINE W REFLEX MICROSCOPIC
Bilirubin Urine: NEGATIVE
Glucose, UA: NEGATIVE mg/dL
Hgb urine dipstick: NEGATIVE
Ketones, ur: NEGATIVE mg/dL
Leukocytes,Ua: NEGATIVE
Nitrite: NEGATIVE
Protein, ur: NEGATIVE mg/dL
Specific Gravity, Urine: 1.019 (ref 1.005–1.030)
pH: 5 (ref 5.0–8.0)

## 2022-02-25 LAB — COMPREHENSIVE METABOLIC PANEL
ALT: 25 U/L (ref 0–44)
AST: 31 U/L (ref 15–41)
Albumin: 3.5 g/dL (ref 3.5–5.0)
Alkaline Phosphatase: 53 U/L (ref 38–126)
Anion gap: 13 (ref 5–15)
BUN: 17 mg/dL (ref 8–23)
CO2: 29 mmol/L (ref 22–32)
Calcium: 9.7 mg/dL (ref 8.9–10.3)
Chloride: 96 mmol/L — ABNORMAL LOW (ref 98–111)
Creatinine, Ser: 0.88 mg/dL (ref 0.61–1.24)
GFR, Estimated: >60 mL/min (ref >60)
Glucose, Bld: 115 mg/dL — ABNORMAL HIGH (ref 70–99)
Potassium: 4.2 mmol/L (ref 3.5–5.1)
Sodium: 138 mmol/L (ref 135–145)
Total Bilirubin: 0.7 mg/dL (ref 0.3–1.2)
Total Protein: 6.5 g/dL (ref 6.5–8.1)

## 2022-02-25 LAB — TROPONIN I (HIGH SENSITIVITY)
Troponin I (High Sensitivity): 8 ng/L (ref <18)
Troponin I (High Sensitivity): 8 ng/L (ref <18)

## 2022-02-25 LAB — LIPASE, BLOOD: Lipase: 40 U/L (ref 11–51)

## 2022-02-25 MED ORDER — IOHEXOL 350 MG/ML SOLN
100.0000 mL | Freq: Once | INTRAVENOUS | Status: AC | PRN
  Administered 2022-02-25: 100 mL via INTRAVENOUS

## 2022-02-25 NOTE — ED Notes (Signed)
Patient tolerated PO fluids and food with no problem at this time.

## 2022-02-25 NOTE — Discharge Instructions (Signed)
Start Pepcid at home.  Return for new or worsening symptoms

## 2022-02-25 NOTE — ED Notes (Signed)
Patient transported to CT 

## 2022-02-25 NOTE — ED Triage Notes (Addendum)
Patient BIB [facility] from home for evaluation of burning abdominal pain that started today, tested positive for COVID on Saturday. Patient is alert, oriented, afebrile with EMS, and is in no apparent distress at hti stime. Patient states discomfort that prompted patient to called EMS had started to resolve on arrival to ED.

## 2022-02-25 NOTE — Telephone Encounter (Signed)
Transition Care Management Follow-up Telephone Call Date of discharge and from where:   [place] 02-24-22 Dx :COVID-COPD Exacerbation    How have you been since you were released from the hospital? Feeling tired  Any questions or concerns? No  Items Reviewed: Did the pt receive and understand the discharge instructions provided? Yes  Medications obtained and verified? Yes  Other? No  Any new allergies since your discharge? No  Dietary orders reviewed? Yes Do you have support at home? Yes   Home Care and Equipment/Supplies: Were home health services ordered? Yes PT If so, what is the name of the agency? [facility]   Has the agency set up a time to come to the patient's home? no Were any new equipment or medical supplies ordered?  no What is the name of the medical supply agency? [facility] Were you able to get the supplies/equipment? not applicable Do you have any questions related to the use of the equipment or supplies? No  Functional Questionnaire: (I = Independent and D = Dependent) ADLs: I  Bathing/Dressing- I  Meal Prep- I  Eating- I  Maintaining continence- I  Transferring/Ambulation- I-WALKER  Managing Meds- I  Follow up appointments reviewed:  PCP Hospital f/u appt confirmed? Yes  Scheduled to see [name] NP on 03-09-22 @ [facility] f/u appt confirmed? Yes  Scheduled to see orthopaedic on 03-07-22 @ unsure of time . Are transportation arrangements needed? No  If their condition worsens, is the pt aware to call PCP or go to the Emergency Dept.? Yes Was the patient provided with contact information for the PCP's office or ED? Yes Was to pt encouraged to call back with questions or concerns? Yes   Juanda Crumble LPN [place] Direct Dial [phone]

## 2022-02-25 NOTE — ED Provider Notes (Signed)
[place] EMERGENCY DEPARTMENT Provider Note   CSN: [id number] Arrival date & time: 02/25/22  1314    History  Chief Complaint  Patient presents with   Abdominal Pain    Steven Guzman is a 81 y.o. male with past medical history significant for CAD, V. tach, s/p ICD, heart failure, COPD, A-fib on chronic anticoagulation, recent hospital discharge yesterday here for evaluation of intermittent epigastric pain.  Typically occurs when he coughs.  States goes from his epigastric region into his periumbilical region.  Has had 2 episodes.  Pain currently resolved.  States he was hospitalized for COVID on 11/26-11/28 at [facility].  Did note he had 2 episodes of his ICD going off at that time.  He was told he needed to follow-up outpatient by cardiology as his defibrillator has less than 3 months till needs replacement.  He denies any chest pain, shortness of breath.  Still is persistent cough without hemoptysis.  He is on chronic anticoagulation.  He has known aneurysm.  No dysuria or hematuria.  Has noted he has inguinal hernia which has been chronic for a long time.  No increase in swelling, redness or pain to groin. Hx of pancreatitis. No NSAID use, ETOH use. No back pain. Normal BM at home PTA without melena or BRBPR.  HPI     Home Medications Prior to Admission medications   Medication Sig Start Date End Date Taking? Authorizing Provider  albuterol (PROVENTIL) (2.5 MG/3ML) 0.083% nebulizer solution USE 1 VIAL VIA NEBULIZER EVERY 6 HOURS AS NEEDED FOR WHEEZING OR SHORTNESS OF BREATH 07/18/19   Laurin Coder, MD  amiodarone (PACERONE) 200 MG tablet TAKE ONE TABLET BY MOUTH DAILY 07/22/21   Belva Crome, MD  amoxicillin-clavulanate (AUGMENTIN) 875-125 MG tablet Take 1 tablet by mouth every 12 (twelve) hours. 02/24/22   [provider]  apixaban (ELIQUIS) 5 MG TABS tablet Take 1 tablet (5 mg total) by mouth 2 (two) times daily. 10/10/21   Belva Crome, MD   Artificial Tear Solution (SOOTHE XP OP) Place 2 drops into both eyes daily as needed (dry eyes).    [provider]  ascorbic acid (VITAMIN C) 500 MG tablet Take 1 tablet by mouth daily. 02/10/22   [provider]  benazepril (LOTENSIN) 10 MG tablet Take 0.5 tablets (5 mg total) by mouth daily. 01/07/22   Belva Crome, MD  benzonatate (TESSALON) 100 MG capsule Take 1 capsule by mouth 3 (three) times daily as needed. 02/24/22   [provider]  buPROPion (WELLBUTRIN) 75 MG tablet TAKE ONE TABLET BY MOUTH DAILY 09/10/21   Fargo, Amy E, NP  busPIRone (BUSPAR) 15 MG tablet Take 15 mg by mouth 3 (three) times daily. Patient taking 10 mg by mouth twice daily    [provider]  calcium citrate (CALCITRATE - DOSED IN MG ELEMENTAL CALCIUM) 950 (200 Ca) MG tablet Take by mouth. 02/10/22   [provider]  carvedilol (COREG) 3.125 MG tablet Take 1 tablet (3.125 mg total) by mouth 2 (two) times daily. 09/19/21   Belva Crome, MD  cetirizine (ZYRTEC) 10 MG tablet Take 1 tablet by mouth daily. 12/16/17   [provider]  Cyanocobalamin (VITAMIN B-12) 5000 MCG SUBL Place under the tongue daily.    [provider]  escitalopram (LEXAPRO) 20 MG tablet Take 20 mg by mouth daily. Take 1/2 tablet (10 mg total ) by mouth daily    [provider]  fluticasone Asencion Islam)  50 MCG/ACT nasal spray Place 2 sprays into both nostrils daily. 02/13/21   Fargo, Amy E, NP  furosemide (LASIX) 40 MG tablet Take 0.5 tablets (20 mg total) by mouth daily. 11/13/21   Fargo, Amy E, NP  gabapentin (NEURONTIN) 300 MG capsule Take 400 mg by mouth 2 (two) times daily. Pt take 1 tablet 300 mg at night and 100 mg in the morning.    [provider]  guaiFENesin (MUCINEX) 600 MG 12 hr tablet Take 1 tablet by mouth 2 (two) times daily.    [provider]  Ipratropium-Albuterol (COMBIVENT RESPIMAT) 20-100 MCG/ACT AERS respimat Inhale 1 puff into the lungs every  6 (six) hours. Shortness of breath or wheezing 09/18/21   [name] E, NP  IRON PO Take by mouth daily.     [provider]  MAGNESIUM-OXIDE 400 (241.3 Mg) MG tablet TAKE 1 TABSULE BY MOUTH DAILY 11/24/18   Hoyt Koch, MD  mexiletine (MEXITIL) 200 MG capsule Take 1 capsule (200 mg total) by mouth 2 (two) times daily. 10/10/21   Belva Crome, MD  Multiple Vitamins-Minerals (CENTRUM ADULTS PO) Take by mouth daily.    [provider]  mupirocin ointment (BACTROBAN) 2 % Apply 1 application. topically 2 (two) times daily. Apply to lower leg wounds daily prn 07/17/21   Fargo, Amy E, NP  nitroGLYCERIN (NITROSTAT) 0.4 MG SL tablet Place 1 tablet (0.4 mg total) under the tongue every 5 (five) minutes as needed for chest pain. DISSOLVE 1 TABLET UNDER THE TONGUE EVERY 5 MINUTES FOR 3 DOSES 05/08/21   Belva Crome, MD  omeprazole (PRILOSEC) 20 MG capsule Take 20 mg by mouth daily.     [provider]  potassium chloride SA (KLOR-CON M) 20 MEQ tablet Take 20 mEq by mouth once.    [provider]  Respiratory Therapy Supplies (FLUTTER) DEVI Use as directed 03/16/17   Juanito Doom, MD  rosuvastatin (CRESTOR) 40 MG tablet Take 1 tablet (40 mg total) by mouth daily. 06/05/21   [name], MD  Sennosides-Docusate Sodium (STOOL SOFTENER/LAXATIVE PO) Take 2 tablets by mouth daily.    [provider]  Spacer/Aero-Holding Chambers (OPTICHAMBER DIAMOND) [place] optichamber [facility] 09/12/19   Olalere, Ernesto Rutherford, MD  STIOLTO RESPIMAT 2.5-2.5 MCG/ACT AERS Inhale 2 puffs into the lungs daily. 09/22/21   [provider]  tamsulosin (FLOMAX) 0.4 MG CAPS capsule TAKE ONE CAPSULE BY MOUTH DAILY AFTER SUPPER 06/30/21   Fargo, Amy E, NP      Allergies    Sulfa antibiotics    Review of Systems   Review of Systems  Constitutional:  Positive for activity change, appetite change and fatigue.  HENT:  Positive for congestion, postnasal drip, rhinorrhea and sinus pain.  Negative for sore throat, tinnitus, trouble swallowing and voice change.   Respiratory:  Positive for cough. Negative for apnea, choking, chest tightness, shortness of breath, wheezing and stridor.   Cardiovascular: Negative.   Gastrointestinal:  Positive for abdominal pain. Negative for abdominal distention, anal bleeding, blood in stool, constipation, diarrhea, nausea, rectal pain and vomiting.  Genitourinary: Negative.   Musculoskeletal: Negative.   Skin: Negative.   Neurological: Negative.   All other systems reviewed and are negative.   Physical Exam Updated Vital Signs BP (!) 151/87   Pulse 91   Temp 97.8 F (36.6 C) (Oral)   Resp (!) 26   Ht 6' (1.829 m)   Wt 88.5 kg   SpO2 95%   BMI  26.45 kg/m  Physical Exam Vitals and nursing note reviewed.  Constitutional:      General: He is not in acute distress.    Appearance: He is well-developed. He is ill-appearing (chronically ill appearing). He is not toxic-appearing or diaphoretic.  HENT:     Head: Normocephalic and atraumatic.  Eyes:     Pupils: Pupils are equal, round, and reactive to light.  Cardiovascular:     Rate and Rhythm: Normal rate and regular rhythm.     Heart sounds: Normal heart sounds.  Pulmonary:     Effort: Pulmonary effort is normal. No respiratory distress.     Breath sounds: Normal breath sounds.     Comments: Minimal rhonchi, speaks in full sentences without difficulty Abdominal:     General: Bowel sounds are normal. There is no distension.     Palpations: Abdomen is soft.     Tenderness: There is abdominal tenderness in the epigastric area. There is no right CVA tenderness, left CVA tenderness, guarding or rebound. Negative signs include Murphy's sign and McBurney's sign.     Hernia: A hernia is present. Hernia is present in the right inguinal area.     Comments: Soft, minimal tenderness to epigastric region.  No midline pulsatile abdominal mass. Right inguinal hernia, non tender without erythema,  warmth  Musculoskeletal:        General: Normal range of motion.     Cervical back: Normal range of motion and neck supple.     Comments: No bony tenderness, compartments soft.  Skin:    General: Skin is warm and dry.     Capillary Refill: Capillary refill takes less than 2 seconds.     Comments: Chronic venous stasis skin changes BIL  Neurological:     General: No focal deficit present.     Mental Status: He is alert and oriented to person, place, and time.    ED Results / Procedures / Treatments   Labs (all labs ordered are listed, but only abnormal results are displayed) Labs Reviewed  CBC - Abnormal; Notable for the following components:      Result Value   RBC 3.23 (*)    Hemoglobin 11.2 (*)    HCT 34.8 (*)    MCV 107.7 (*)    MCH 34.7 (*)    RDW 18.2 (*)    All other components within normal limits  COMPREHENSIVE METABOLIC PANEL - Abnormal; Notable for the following components:   Chloride 96 (*)    Glucose, Bld 115 (*)    All other components within normal limits  URINALYSIS, ROUTINE W REFLEX MICROSCOPIC - Abnormal; Notable for the following components:   Color, Urine AMBER (*)    All other components within normal limits  LIPASE, BLOOD  TROPONIN I (HIGH SENSITIVITY)  TROPONIN I (HIGH SENSITIVITY)    EKG EKG Interpretation  Date/Time:  Wednesday February 25 2022 15:37:48 EST Ventricular Rate:  89 PR Interval:  186 QRS Duration: 162 QT Interval:  422 QTC Calculation: 513 R Axis:   97 Text Interpretation: Sinus rhythm with Premature atrial complexes Rightward axis Confirmed by Lennice Sites ([id number]) on 02/25/2022 4:21:36 PM  Radiology CT Angio Chest PE W and/or Wo Contrast  Result Date: 02/25/2022 CLINICAL DATA:  Sternal chest pain, epigastric pain for 2 hours, COVID positive EXAM: CT ANGIOGRAPHY CHEST CT ABDOMEN AND PELVIS WITH CONTRAST TECHNIQUE: Multidetector CT imaging of the chest was performed using the standard protocol during bolus administration of  intravenous contrast. Multiplanar CT image reconstructions  and MIPs were obtained to evaluate the vascular anatomy. Multidetector CT imaging of the abdomen and pelvis was performed using the standard protocol during bolus administration of intravenous contrast. RADIATION DOSE REDUCTION: This exam was performed according to the departmental dose-optimization program which includes automated exposure control, adjustment of the mA and/or kV according to patient size and/or use of iterative reconstruction technique. CONTRAST:  143m OMNIPAQUE IOHEXOL 350 MG/ML SOLN COMPARISON:  02/25/2022, 05/16/2021 FINDINGS: CTA CHEST FINDINGS Cardiovascular: This is a technically adequate evaluation of the pulmonary vasculature. There are no filling defects or pulmonary emboli. Ascending thoracic aortic aneurysm measures 4.5 cm, previously measuring 4.3 cm. Aneurysm of the distal aortic arch measures up to 4.5 cm, previously 4.1 cm. Proximal descending thoracic aorta measures up to 3.7 cm, unchanged. Evaluation of the aortic lumen is limited due to timing of contrast bolus. Diffuse atherosclerosis of the aorta and coronary vasculature is again noted. Left ventricular dilatation is noted. No pericardial effusion. Dual lead pacer again noted unchanged. Mediastinum/Nodes: No enlarged mediastinal, hilar, or axillary lymph nodes. Thyroid gland, trachea, and esophagus demonstrate no significant findings. Stable hiatal hernia. Lungs/Pleura: Upper lobe predominant emphysema is again noted. There is bilateral bronchial wall thickening, with scattered ground-glass airspace disease in the lung apices, consistent with given history of COVID-19 pneumonia. No effusion or pneumothorax. Musculoskeletal: No acute or destructive bony lesions. Reconstructed images demonstrate no additional findings. Review of the MIP images confirms the above findings. CT ABDOMEN and PELVIS FINDINGS Hepatobiliary: Gas is again identified within the biliary tree and  gallbladder lumen consistent with prior sphincterotomy. No evidence of cholelithiasis or cholecystitis. The liver is unremarkable. Pancreas: Unremarkable. No pancreatic ductal dilatation or surrounding inflammatory changes. Spleen: Normal in size without focal abnormality. Adrenals/Urinary Tract: The kidneys, adrenals, and bladder are stable. No urinary tract calculi or obstructive uropathy. Stomach/Bowel: No bowel obstruction or ileus. No bowel wall thickening or inflammatory change. Minimal retained stool within the colon. Vascular/Lymphatic: Fusiform infrarenal abdominal aortic aneurysm measuring up to 3.3 cm, stable. Diffuse atherosclerosis is again noted. No evidence of dissection. No pathologic adenopathy within the abdomen or pelvis. Reproductive: Evaluation of prostate is limited due to streak artifact from right hip arthroplasty. No gross abnormality. Other: No free fluid or free intraperitoneal gas. Small fat containing umbilical hernia is again noted. There is a large left inguinal hernia containing a portion of the sigmoid colon, stable. This is incompletely evaluated on this study, but there is no evidence of incarceration or bowel ischemia within the visualized portions of the hernia. Musculoskeletal: Right hip arthroplasty. No acute or destructive bony lesions. Reconstructed images demonstrate no additional findings. Review of the MIP images confirms the above findings. IMPRESSION: Chest: 1. No evidence of pulmonary embolus. 2. Thoracic aortic aneurysm, measuring 4.5 cm within the ascending aorta and 4.5 cm at the distal aortic arch. Recommend semi-annual imaging followup by CTA or MRA and referral to cardiothoracic surgery if not already obtained. This recommendation follows 2010 ACCF/AHA/AATS/ACR/ASA/SCA/SCAI/SIR/STS/SVM Guidelines for the Diagnosis and Management of Patients With Thoracic Aortic Disease. Circulation. 2010; 121:: V564-P329 Aortic aneurysm NOS (ICD10-I71.9) 3. Bilateral bronchial  wall thickening, with patchy upper lobe predominant ground-glass airspace disease. Findings are consistent with given history of COVID-19. 4. Left ventricular dilation. 5. Hiatal hernia. 6. Aortic Atherosclerosis (ICD10-I70.0). Coronary artery atherosclerosis. Abdomen/pelvis: 1. 3.3 cm infrarenal abdominal aortic aneurysm. Recommend follow-up ultrasound every 3 years. This recommendation follows ACR consensus guidelines: White Paper of the ACR Incidental Findings Committee II on Vascular Findings. J Am Coll Radiol 2013;  03:474-259. 2. Stable umbilical and left inguinal hernias. 3. No acute intra-abdominal or intrapelvic process. Electronically Signed   By: Randa Ngo M.D.   On: 02/25/2022 20:11   CT ABDOMEN PELVIS W CONTRAST  Result Date: 02/25/2022 CLINICAL DATA:  Sternal chest pain, epigastric pain for 2 hours, COVID positive EXAM: CT ANGIOGRAPHY CHEST CT ABDOMEN AND PELVIS WITH CONTRAST TECHNIQUE: Multidetector CT imaging of the chest was performed using the standard protocol during bolus administration of intravenous contrast. Multiplanar CT image reconstructions and MIPs were obtained to evaluate the vascular anatomy. Multidetector CT imaging of the abdomen and pelvis was performed using the standard protocol during bolus administration of intravenous contrast. RADIATION DOSE REDUCTION: This exam was performed according to the departmental dose-optimization program which includes automated exposure control, adjustment of the mA and/or kV according to patient size and/or use of iterative reconstruction technique. CONTRAST:  141m OMNIPAQUE IOHEXOL 350 MG/ML SOLN COMPARISON:  02/25/2022, 05/16/2021 FINDINGS: CTA CHEST FINDINGS Cardiovascular: This is a technically adequate evaluation of the pulmonary vasculature. There are no filling defects or pulmonary emboli. Ascending thoracic aortic aneurysm measures 4.5 cm, previously measuring 4.3 cm. Aneurysm of the distal aortic arch measures up to 4.5 cm,  previously 4.1 cm. Proximal descending thoracic aorta measures up to 3.7 cm, unchanged. Evaluation of the aortic lumen is limited due to timing of contrast bolus. Diffuse atherosclerosis of the aorta and coronary vasculature is again noted. Left ventricular dilatation is noted. No pericardial effusion. Dual lead pacer again noted unchanged. Mediastinum/Nodes: No enlarged mediastinal, hilar, or axillary lymph nodes. Thyroid gland, trachea, and esophagus demonstrate no significant findings. Stable hiatal hernia. Lungs/Pleura: Upper lobe predominant emphysema is again noted. There is bilateral bronchial wall thickening, with scattered ground-glass airspace disease in the lung apices, consistent with given history of COVID-19 pneumonia. No effusion or pneumothorax. Musculoskeletal: No acute or destructive bony lesions. Reconstructed images demonstrate no additional findings. Review of the MIP images confirms the above findings. CT ABDOMEN and PELVIS FINDINGS Hepatobiliary: Gas is again identified within the biliary tree and gallbladder lumen consistent with prior sphincterotomy. No evidence of cholelithiasis or cholecystitis. The liver is unremarkable. Pancreas: Unremarkable. No pancreatic ductal dilatation or surrounding inflammatory changes. Spleen: Normal in size without focal abnormality. Adrenals/Urinary Tract: The kidneys, adrenals, and bladder are stable. No urinary tract calculi or obstructive uropathy. Stomach/Bowel: No bowel obstruction or ileus. No bowel wall thickening or inflammatory change. Minimal retained stool within the colon. Vascular/Lymphatic: Fusiform infrarenal abdominal aortic aneurysm measuring up to 3.3 cm, stable. Diffuse atherosclerosis is again noted. No evidence of dissection. No pathologic adenopathy within the abdomen or pelvis. Reproductive: Evaluation of prostate is limited due to streak artifact from right hip arthroplasty. No gross abnormality. Other: No free fluid or free  intraperitoneal gas. Small fat containing umbilical hernia is again noted. There is a large left inguinal hernia containing a portion of the sigmoid colon, stable. This is incompletely evaluated on this study, but there is no evidence of incarceration or bowel ischemia within the visualized portions of the hernia. Musculoskeletal: Right hip arthroplasty. No acute or destructive bony lesions. Reconstructed images demonstrate no additional findings. Review of the MIP images confirms the above findings. IMPRESSION: Chest: 1. No evidence of pulmonary embolus. 2. Thoracic aortic aneurysm, measuring 4.5 cm within the ascending aorta and 4.5 cm at the distal aortic arch. Recommend semi-annual imaging followup by CTA or MRA and referral to cardiothoracic surgery if not already obtained. This recommendation follows 2010 ACCF/AHA/AATS/ACR/ASA/SCA/SCAI/SIR/STS/SVM Guidelines for the Diagnosis and Management  of Patients With Thoracic Aortic Disease. Circulation. 2010; 121: Z610-R604. Aortic aneurysm NOS (ICD10-I71.9) 3. Bilateral bronchial wall thickening, with patchy upper lobe predominant ground-glass airspace disease. Findings are consistent with given history of COVID-19. 4. Left ventricular dilation. 5. Hiatal hernia. 6. Aortic Atherosclerosis (ICD10-I70.0). Coronary artery atherosclerosis. Abdomen/pelvis: 1. 3.3 cm infrarenal abdominal aortic aneurysm. Recommend follow-up ultrasound every 3 years. This recommendation follows ACR consensus guidelines: White Paper of the ACR Incidental Findings Committee II on Vascular Findings. J Am Coll Radiol 2013; 54:098-119. 2. Stable umbilical and left inguinal hernias. 3. No acute intra-abdominal or intrapelvic process. Electronically Signed   By: Randa Ngo M.D.   On: 02/25/2022 20:11   CT ABDOMEN PELVIS WO CONTRAST  Result Date: 02/25/2022 CLINICAL DATA:  Abdominal pain EXAM: CT ABDOMEN AND PELVIS WITHOUT CONTRAST TECHNIQUE: Multidetector CT imaging of the abdomen and  pelvis was performed following the standard protocol without IV contrast. RADIATION DOSE REDUCTION: This exam was performed according to the departmental dose-optimization program which includes automated exposure control, adjustment of the mA and/or kV according to patient size and/or use of iterative reconstruction technique. COMPARISON:  CT 05/16/2021, 12/17/2017 FINDINGS: Lower chest: Included lung bases are clear.  Heart size is normal. Hepatobiliary: Unremarkable unenhanced appearance of the liver. No focal liver lesion identified. Pneumobilia is again seen compatible with prior sphincterotomy. Gallbladder within normal limits. No hyperdense gallstone. No biliary dilatation. Pancreas: Unremarkable. No pancreatic ductal dilatation or surrounding inflammatory changes. Spleen: Normal in size without focal abnormality. Adrenals/Urinary Tract: Stable benign bilateral adrenal adenomas. 4.0 cm exophytic cyst arising from the lower pole of the right kidney. No follow-up imaging required. No evidence of a solid renal mass. No stone or hydronephrosis. Urinary bladder is partially obscured by metal artifact from patient's hip prosthesis, but appears otherwise unremarkable. Stomach/Bowel: Moderate-sized hiatal hernia. Stomach otherwise within normal limits. Duodenal diverticulum. No dilated loops of bowel. Proximal sigmoid colon extends into patient's left inguinal hernia. No focal bowel wall thickening or inflammatory changes. A normal appendix is present in the right lower quadrant. Vascular/Lymphatic: Aortoiliac atherosclerosis. Stable 3.4 cm infrarenal aortic aneurysm. No abdominopelvic lymphadenopathy. Reproductive: Prostate is unremarkable. Other: Large left inguinal hernia containing non compromised loop of colon. Hernia has significantly increased in size since 2019. Small fat containing umbilical hernia without complicating feature. No ascites. No pneumoperitoneum. Musculoskeletal: No acute bony findings. Chronic  bilateral L5 pars interarticularis defects with grade 2 anterolisthesis of L5 on S1. Postsurgical changes from right total hip arthroplasty. Fluid collection in the region of the right peritrochanteric bursa measuring approximately 10 x 2 cm (series 6, image 83), evaluation limited by the degree of streak artifact. IMPRESSION: 1. No acute abdominopelvic findings. 2. Large left inguinal hernia containing a non-obstructed loop of colon. Hernia has significantly increased in size since 2019. 3. Moderate-sized hiatal hernia. 4. Fluid collection in the region of the right peritrochanteric bursa measuring approximately 10 x 2 cm, likely representing peritrochanteric bursitis. 5. Stable 3.4 cm infrarenal aortic aneurysm. Recommend follow-up ultrasound every 3 years. This recommendation follows ACR consensus guidelines: White Paper of the ACR Incidental Findings Committee II on Vascular Findings. J Am Coll Radiol 2013; 10:789-794. 6. Aortic atherosclerosis (ICD10-I70.0). Electronically Signed   By: Davina Poke D.O.   On: 02/25/2022 17:07   DG Chest 2 View  Result Date: 02/25/2022 CLINICAL DATA:  Chest pain.  Recent positive COVID test. EXAM: CHEST - 2 VIEW COMPARISON:  03/29/2021; chest CT - 03/15/2022 FINDINGS: Grossly unchanged cardiac silhouette and mediastinal contours with atherosclerotic plaque  within the thoracic aorta. Stable position of support apparatus. The lungs are hyperexpanded with flattening of bilateral hemidiaphragms. No focal airspace opacities. No pleural effusion or pneumothorax. No evidence of edema. Old/healed left eighth rib fracture. No acute osseous abnormalities. IMPRESSION: Similar findings of lung hyperexpansion without superimposed acute cardiopulmonary disease. Specifically, no focal airspace opacities to suggest pneumonia. Electronically Signed   By: Sandi Mariscal M.D.   On: 02/25/2022 15:06   CUP PACEART REMOTE DEVICE CHECK  Result Date: 02/25/2022 Scheduled remote reviewed.  Normal device function.  2 episodes with Antitachycardia pacing (ATP) therapy delivered to convert arrhythmia. Events occurred 11/25 and 11/27, EGM's show 1:1 with rates >110 falling in to the VT-1 zone. Battery estimated <84moNext remote 2/27 route for IFU LANov 27, 2023 01:19 - Yellow Alert - Antitachycardia pacing (ATP) therapy delivered to convert arrhythmia. Feb 21, 2022 23:39 - Yellow Alert - Antitachycardia pacing (ATP) therapy delivered to convert arrhythmia.   Procedures Procedures    Medications Ordered in ED Medications  iohexol (OMNIPAQUE) 350 MG/ML injection 100 mL (100 mLs Intravenous Contrast Given 02/25/22 1942)    ED Course/ Medical Decision Making/ A&P     81year old with extensive past medical history, [facility] recent fall with extended hospitalization few weeks before that here for evaluation of intermittent epigastric pain.  Does not radiate.  Known history of AAA.  Denies any chest pain, shortness of breath.  Pain worsens when he coughs.  He is chronically anticoagulated.  Denies hemoptysis.  No clinical evidence of VTE.  Does have some very minimal rhonchi bilaterally which is likely consistent with his known COVID infection.  He has right inguinal hernia which she states has been there for extended time, soft, nontender without overlying skin changes.  Low suspicion for incarceration, strangulation.  Will plan on labs, imaging and reassess  Labs and imaging personally viewed and interpreted:  CBC without leukocytosis, Hgb 11.2, baseline appears to be around 12 CMP glucose 115 Lipase 40 Trop 8, flat UA neg for infection Chest xray without acute findings CTA chest without acute findings, does have known enlarged aorta, he iwll need to FU outpatient for this.  Lateral groundglass opacities consistent with known COVID infection CT abdomen pelvis infrarenal aneurysm recommend follow-up in 3  years  Patient reassessed.  Apparently had gotten up to use the restroom is bending over to get into bed and felt winded.  He requested supplemental oxygen.  He had no hypoxia or tachypnea per nursing.  This was able to come off after approximately 5 minutes he has had no further episodes of shortness of breath.  Patient was aware that he had 2 antitachycardia pacing events while he was previously hospitalized.  States he is no longer allowed to drive until 6 months sx free.  He denies any recent chest pain, shortness of breath or firing since being previously hospitalized. Has FU already with EP.  Patient reassessed.  Has been asymptomatic here in emergency department, tolerating p.o. intake.  Unclear etiology of his earlier symptoms however he is currently pain-free.  Have low suspicion for acute ACS, PE, dissection, PNA, pneumothorax, appendicitis, bowel obstruction, bowel perforation, cholecystitis, diverticulitis, pancreatitis, perforated ulcer, Gi bleed.  Will have him follow-up outpatient.  He will return for new or worsening symptoms.  The patient has been appropriately medically screened and/or stabilized in the ED. I have low suspicion for any other emergent medical condition which would require further screening, evaluation  or treatment in the ED or require inpatient management.  Patient is hemodynamically stable and in no acute distress.  Patient able to ambulate in department prior to ED.  Evaluation does not show acute pathology that would require ongoing or additional emergent interventions while in the emergency department or further inpatient treatment.  I have discussed the diagnosis with the patient and answered all questions.  Pain is been managed while in the emergency department and patient has no further complaints prior to discharge.  Patient is comfortable with plan discussed in room and is stable for discharge at this time.  I have discussed strict return precautions for  returning to the emergency department.  Patient was encouraged to follow-up with PCP/specialist refer to at discharge.                            Medical Decision Making Amount and/or Complexity of Data Reviewed Independent Historian: EMS External Data Reviewed: labs, radiology, ECG and notes. Labs: ordered. Decision-making details documented in ED Course. Radiology: ordered and independent interpretation performed. Decision-making details documented in ED Course. ECG/medicine tests: ordered and independent interpretation performed. Decision-making details documented in ED Course.  Risk OTC drugs. Prescription drug management. Parenteral controlled substances. Decision regarding hospitalization. Diagnosis or treatment significantly limited by social determinants of health.         Final Clinical Impression(s) / ED Diagnoses Final diagnoses:  Epigastric pain  Chronic anticoagulation  Infrarenal abdominal aortic aneurysm (AAA) without rupture (HCC)  COVID  Anemia, unspecified type    Rx / DC Orders ED Discharge Orders     None         Gilmore List A, PA-C 02/25/22 2256    Leanord Asal K, DO 02/25/22 2300

## 2022-02-25 NOTE — ED Notes (Signed)
Placed patient on 2L [place] at this time per patient request of feeling SOB after cleaning patient up and cleaning bed.

## 2022-02-25 NOTE — Telephone Encounter (Signed)
Received the following alerts from [employer]:  Scheduled remote reviewed. Normal device function.   2 episodes with Antitachycardia pacing (ATP) therapy delivered to convert arrhythmia. Events occurred 11/25 and 11/27, EGM's show 1:1 with rates >110 falling in to the VT-1 zone. Battery estimated <27moNext remote 2/27 route for IFU [place]  Spoke with patient:   He fell fx hip and had surgery 2 weeks ago.  DX with COVID on 02/21/22 after going to UC with severe dyspnea  Recently hospitalized from 11/25 - 11/28 with COVID and COPD exacerbation Hospital changed patient's carvedilol to metoprolol.  Has been on abx (augmentin) and prednisone  Current condition:  Patient reports that he is feeling better, breathing has improved.  He is home now and doing better.   I did educate him on DMV protocol that he is not able to drive for 659ms due to receiving ATP therapies. Also, patient informed that if he develops any lightheadedness, dizziness or syncopal type episodes that he is to call 911 and go to the hospital.  Patient verbalizes understanding.   Battery:  Confirmed with [employer] that his device is not <55m66m to explant, but actually <55mt47muntil it reaches RRT.  As a result, I notified patient that we will be placing him on monthly checks to monitor.   Past due for follow up: Patient is past due for appt with Dr. TaylLovena Leiven everything going on with patient and current active COVID infection, will get him scheduled in 2-3 weeks with TaylLovena Leatient aware if any concerns to call us [place] or go to ER depending on severity.   Patient verbalizes understanding of all instructions given today. FYI to Dr. TaylLovena Le

## 2022-02-25 NOTE — ED Notes (Signed)
This Network engineer has called a Technical sales engineer for [facility]

## 2022-02-25 NOTE — ED Provider Triage Note (Signed)
Emergency Medicine Provider Triage Evaluation Note  Steven Guzman , a 81 y.o. male  was evaluated in triage.  Pt complains of some pain from sternum to abd that began earlier today around 11am and lasted approximately 2 hours. States he has a hernia and he was worried this might have been related.   Tested + for covid over the weekend. Was started on solumedrol.   States he feels generally well currently. No LH or dizziness.  States BMS have been normal.    Review of Systems  Positive: Coughing, resolved CP/Abd pain Negative: Fever   Physical Exam  BP 113/80 (BP Location: Right Arm)   Pulse 60   Temp (!) 97.5 F (36.4 C)   Resp 17   SpO2 95%  Gen:   Awake, no distress   Resp:  Normal effort  MSK:   Moves extremities without difficulty  Other:  Lungs grossly clear. Abd soft NTTP  Medical Decision Making  Medically screening exam initiated at 2:20 PM.  Appropriate orders placed.  [name] was informed that the remainder of the evaluation will be completed by another provider, this initial triage assessment does not replace that evaluation, and the importance of remaining in the ED until their evaluation is complete.  Labs, EKG, CXR   Tedd Sias, [place] 02/25/22 1423

## 2022-02-26 LAB — CUP PACEART REMOTE DEVICE CHECK
Battery Remaining Longevity: 3 mo — CL
Battery Remaining Percentage: 2 %
Brady Statistic RA Percent Paced: 1 %
Brady Statistic RV Percent Paced: 0 %
Date Time Interrogation Session: 20231128175700
HighPow Impedance: 55 Ohm
Implantable Lead Connection Status: 753985
Implantable Lead Connection Status: 753985
Implantable Lead Implant Date: 20110610
Implantable Lead Implant Date: 20110610
Implantable Lead Location: 753859
Implantable Lead Location: 753860
Implantable Lead Model: 185
Implantable Lead Model: 4135
Implantable Lead Serial Number: 28681386
Implantable Lead Serial Number: 339643
Implantable Pulse Generator Implant Date: 20110610
Lead Channel Impedance Value: 424 Ohm
Lead Channel Impedance Value: 597 Ohm
Lead Channel Pacing Threshold Amplitude: 0.9 V
Lead Channel Pacing Threshold Amplitude: 1.1 V
Lead Channel Pacing Threshold Pulse Width: 0.4 ms
Lead Channel Pacing Threshold Pulse Width: 0.4 ms
Lead Channel Setting Pacing Amplitude: 2 V
Lead Channel Setting Pacing Amplitude: 2.2 V
Lead Channel Setting Pacing Pulse Width: 0.4 ms
Lead Channel Setting Sensing Sensitivity: 0.6 mV
Pulse Gen Serial Number: 164892

## 2022-02-26 NOTE — Telephone Encounter (Signed)
Noted  

## 2022-03-03 ENCOUNTER — Telehealth: Payer: Self-pay | Admitting: *Deleted

## 2022-03-03 NOTE — Telephone Encounter (Signed)
Steven Guzman with [facility] requesting verbal orders for Skilled Nursing 1X6weeks and 2 PRN.   Verbal orders given.

## 2022-03-05 ENCOUNTER — Telehealth: Payer: Self-pay

## 2022-03-05 NOTE — Telephone Encounter (Signed)
Following alert received from [employer] received for Device alert Explant indicator reached on Mar 04, 2022 15:11.   Patient called advised his ICD reached ERI 03/04/22. Patient has apt with Dr. Lovena Le on 04/03/2022 @ 4:15 pm. Patient aware to come to apt and at that apt. They will schedule gen change. Voiced understanding.

## 2022-03-06 ENCOUNTER — Telehealth: Payer: Self-pay

## 2022-03-06 NOTE — Telephone Encounter (Signed)
Thank you  for update

## 2022-03-06 NOTE — Telephone Encounter (Signed)
Message left on clinical intake voicemail:   Shelia with [facility] called requesting verbal orders for PT   1 time a week for 1 week 2 times a week for 4 weeks Then 1 time a week for 4 weeks  Per [facility] standing order, verbal order given. Message will be sent to patient's provider as a FYI.

## 2022-03-08 ENCOUNTER — Emergency Department (HOSPITAL_COMMUNITY)
Admission: EM | Admit: 2022-03-08 | Discharge: 2022-03-09 | Disposition: A | Discharge status: Home / Self Care | Payer: Medicare Other | Attending: Emergency Medicine | Admitting: Emergency Medicine

## 2022-03-08 ENCOUNTER — Emergency Department (HOSPITAL_COMMUNITY): Payer: Medicare Other

## 2022-03-08 DIAGNOSIS — I509 Heart failure, unspecified: Secondary | ICD-10-CM | POA: Insufficient documentation

## 2022-03-08 DIAGNOSIS — J449 Chronic obstructive pulmonary disease, unspecified: Secondary | ICD-10-CM | POA: Insufficient documentation

## 2022-03-08 DIAGNOSIS — K5900 Constipation, unspecified: Secondary | ICD-10-CM | POA: Insufficient documentation

## 2022-03-08 DIAGNOSIS — I2510 Atherosclerotic heart disease of native coronary artery without angina pectoris: Secondary | ICD-10-CM | POA: Insufficient documentation

## 2022-03-08 DIAGNOSIS — Z7901 Long term (current) use of anticoagulants: Secondary | ICD-10-CM | POA: Insufficient documentation

## 2022-03-08 DIAGNOSIS — K4090 Unilateral inguinal hernia, without obstruction or gangrene, not specified as recurrent: Secondary | ICD-10-CM | POA: Diagnosis not present

## 2022-03-08 LAB — CBC WITH DIFFERENTIAL/PLATELET
Abs Immature Granulocytes: 0.14 10*3/uL — ABNORMAL HIGH (ref 0.00–0.07)
Basophils Absolute: 0.1 10*3/uL (ref 0.0–0.1)
Basophils Relative: 1 %
Eosinophils Absolute: 0 10*3/uL (ref 0.0–0.5)
Eosinophils Relative: 0 %
HCT: 34.4 % — ABNORMAL LOW (ref 39.0–52.0)
Hemoglobin: 11.1 g/dL — ABNORMAL LOW (ref 13.0–17.0)
Immature Granulocytes: 2 %
Lymphocytes Relative: 15 %
Lymphs Abs: 0.9 10*3/uL (ref 0.7–4.0)
MCH: 34.4 pg — ABNORMAL HIGH (ref 26.0–34.0)
MCHC: 32.3 g/dL (ref 30.0–36.0)
MCV: 106.5 fL — ABNORMAL HIGH (ref 80.0–100.0)
Monocytes Absolute: 0.6 10*3/uL (ref 0.1–1.0)
Monocytes Relative: 10 %
Neutro Abs: 4.4 10*3/uL (ref 1.7–7.7)
Neutrophils Relative %: 72 %
Platelets: 180 10*3/uL (ref 150–400)
RBC: 3.23 MIL/uL — ABNORMAL LOW (ref 4.22–5.81)
RDW: 17.7 % — ABNORMAL HIGH (ref 11.5–15.5)
WBC: 6.2 10*3/uL (ref 4.0–10.5)
nRBC: 0 % (ref 0.0–0.2)

## 2022-03-08 LAB — URINALYSIS, ROUTINE W REFLEX MICROSCOPIC
Bilirubin Urine: NEGATIVE
Glucose, UA: NEGATIVE mg/dL
Hgb urine dipstick: NEGATIVE
Ketones, ur: NEGATIVE mg/dL
Leukocytes,Ua: NEGATIVE
Nitrite: NEGATIVE
Protein, ur: NEGATIVE mg/dL
Specific Gravity, Urine: 1.005 (ref 1.005–1.030)
pH: 6 (ref 5.0–8.0)

## 2022-03-08 LAB — COMPREHENSIVE METABOLIC PANEL
ALT: 31 U/L (ref 0–44)
AST: 28 U/L (ref 15–41)
Albumin: 3.2 g/dL — ABNORMAL LOW (ref 3.5–5.0)
Alkaline Phosphatase: 57 U/L (ref 38–126)
Anion gap: 9 (ref 5–15)
BUN: 11 mg/dL (ref 8–23)
CO2: 25 mmol/L (ref 22–32)
Calcium: 9.1 mg/dL (ref 8.9–10.3)
Chloride: 99 mmol/L (ref 98–111)
Creatinine, Ser: 0.92 mg/dL (ref 0.61–1.24)
GFR, Estimated: >60 mL/min (ref >60)
Glucose, Bld: 118 mg/dL — ABNORMAL HIGH (ref 70–99)
Potassium: 4.3 mmol/L (ref 3.5–5.1)
Sodium: 133 mmol/L — ABNORMAL LOW (ref 135–145)
Total Bilirubin: 0.9 mg/dL (ref 0.3–1.2)
Total Protein: 7 g/dL (ref 6.5–8.1)

## 2022-03-08 LAB — LIPASE, BLOOD: Lipase: 34 U/L (ref 11–51)

## 2022-03-08 MED ORDER — SENNOSIDES-DOCUSATE SODIUM 8.6-50 MG PO TABS: 1.0000 | ORAL_TABLET | Freq: Every day | ORAL | 2 refills | Status: DC

## 2022-03-08 MED ORDER — IOHEXOL 350 MG/ML SOLN
75.0000 mL | Freq: Once | INTRAVENOUS | Status: AC | PRN
  Administered 2022-03-08: 75 mL via INTRAVENOUS

## 2022-03-08 MED ORDER — POLYETHYLENE GLYCOL 3350 17 GM/SCOOP PO POWD: ORAL | 0 refills | Status: DC

## 2022-03-08 NOTE — ED Triage Notes (Signed)
Patient BIB [facility] from home for evaluation of constipation over the last few days. Patient tested postive for COVID-10 two weeks ago.

## 2022-03-08 NOTE — ED Provider Triage Note (Signed)
Emergency Medicine Provider Triage Evaluation Note  Steven Guzman , a 81 y.o. male  was evaluated in triage.  Pt complains of constipation over the past few days.  Patient states he took 2 enemas earlier today and had a small bowel movement.  Last regular bowel movement was 2 days ago.  Review of Systems  Positive: constipation Negative: CP  Physical Exam  BP 103/75 (BP Location: Right Arm)   Pulse 78   Temp 98.3 F (36.8 C) (Oral)   Resp 16   SpO2 97%  Gen:   Awake, no distress   Resp:  Normal effort  MSK:   Moves extremities without difficulty  Other:    Medical Decision Making  Medically screening exam initiated at 12:13 PM.  Appropriate orders placed.  [name] was informed that the remainder of the evaluation will be completed by another provider, this initial triage assessment does not replace that evaluation, and the importance of remaining in the ED until their evaluation is complete.  Labs CT abdomen   Suzy Bouchard, PA-C 03/08/22 1213

## 2022-03-08 NOTE — Discharge Instructions (Addendum)
Thank you for allowing [place] to care for you today.  You were evaluated today for constipation.  We did a CT scan of your abdomen which showed that you have some inflammation in your rectum.  This may be because you had been constipated.  If you have rectal pain, pain was pooping, fevers, please contact your PCP for evaluation of this.  We also saw the inguinal hernia that you have.  As we discussed, you should reach back out to your surgeon to discuss having this repaired.  I have put contact information for [facility].  Please call them to make an appointment to have your hernia evaluated and to discuss possible interventions.  Please call your gastroenterologist and have them review the CT scan that we did today.  We recommend starting taking medications to prevent constipation.  Please start taking MiraLAX daily.  If you have concerns that you are having an emergency, please return to the emergency department.

## 2022-03-08 NOTE — ED Provider Notes (Signed)
[facility] Provider Note   CSN: [id number] Arrival date & time: 03/08/22  1156     History  Chief Complaint  Patient presents with   Constipation    Steven Guzman is a 81 y.o. male with past medical history significant for thoracic aortic aneurysm, CAD, V. 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  He states his last bowel movement was day before yesterday and that it was firm.  He states that he attempted taking Dulcolax as well as administering enema at home and did have some stool output, however he describes it as very minimal.  He endorses some abdominal pain.  He denies back pain, fevers, vomiting.  He has had some nausea over this time.  He has not tried MiraLAX.  He denies abdominal surgeries, though he does note that he has an inguinal hernia.  It has not increased in pain or discomfort.  He denies dysuria, frequency, urgency.  He denies hematochezia, melena, BRBPR.       Home Medications Prior to Admission medications   Medication Sig Start Date End Date Taking? Authorizing Provider  polyethylene glycol powder (GLYCOLAX/MIRALAX) 17 GM/SCOOP powder Take 1 capful in 8 ounces of fluid each morning 03/08/22  Yes Luster Landsberg, MD  senna-docusate (SENOKOT-S) 8.6-50 MG tablet Take 1 tablet by mouth daily. 03/08/22 06/06/22 Yes Luster Landsberg, MD  albuterol (PROVENTIL) (2.5 MG/3ML) 0.083% nebulizer solution USE 1 VIAL VIA NEBULIZER EVERY 6 HOURS AS NEEDED FOR WHEEZING OR SHORTNESS OF BREATH 07/18/19   Laurin Coder, MD  amiodarone (PACERONE) 200 MG tablet TAKE ONE TABLET BY MOUTH DAILY 07/22/21   Belva Crome, MD  amoxicillin-clavulanate (AUGMENTIN) 875-125 MG tablet Take 1 tablet by mouth every 12 (twelve) hours. 02/24/22   [provider]  apixaban (ELIQUIS) 5 MG TABS tablet Take 1 tablet (5 mg total) by mouth 2 (two) times daily.  10/10/21   Belva Crome, MD  Artificial Tear Solution (SOOTHE XP OP) Place 2 drops into both eyes daily as needed (dry eyes).    [provider]  ascorbic acid (VITAMIN C) 500 MG tablet Take 1 tablet by mouth daily. 02/10/22   [provider]  benazepril (LOTENSIN) 10 MG tablet Take 0.5 tablets (5 mg total) by mouth daily. 01/07/22   Belva Crome, MD  benzonatate (TESSALON) 100 MG capsule Take 1 capsule by mouth 3 (three) times daily as needed. 02/24/22   [provider]  buPROPion (WELLBUTRIN) 75 MG tablet TAKE ONE TABLET BY MOUTH DAILY 09/10/21   Fargo, Amy E, NP  busPIRone (BUSPAR) 15 MG tablet Take 15 mg by mouth 3 (three) times daily. Patient taking 10 mg by mouth twice daily    [provider]  calcium citrate (CALCITRATE - DOSED IN MG ELEMENTAL CALCIUM) 950 (200 Ca) MG tablet Take by mouth. 02/10/22   [provider]  carvedilol (COREG) 3.125 MG tablet Take 1 tablet (3.125 mg total) by mouth 2 (two) times daily. 09/19/21   Belva Crome, MD  cetirizine (ZYRTEC) 10 MG tablet Take 1 tablet by mouth daily. 12/16/17   [provider]  Cyanocobalamin (VITAMIN B-12) 5000 MCG SUBL Place under the tongue daily.    [provider]  escitalopram (LEXAPRO) 20 MG tablet Take 20 mg by mouth daily. Take 1/2 tablet (10 mg total ) by mouth daily    [provider]  fluticasone (FLONASE) 50  MCG/ACT nasal spray Place 2 sprays into both nostrils daily. 02/13/21   Fargo, Amy E, NP  furosemide (LASIX) 40 MG tablet Take 0.5 tablets (20 mg total) by mouth daily. 11/13/21   Fargo, Amy E, NP  gabapentin (NEURONTIN) 300 MG capsule Take 400 mg by mouth 2 (two) times daily. Pt take 1 tablet 300 mg at night and 100 mg in the morning.    [provider]  guaiFENesin (MUCINEX) 600 MG 12 hr tablet Take 1 tablet by mouth 2 (two) times daily.    [provider]  Ipratropium-Albuterol (COMBIVENT RESPIMAT) 20-100 MCG/ACT AERS respimat  Inhale 1 puff into the lungs every 6 (six) hours. Shortness of breath or wheezing 09/18/21   [name] E, NP  IRON PO Take by mouth daily.     [provider]  MAGNESIUM-OXIDE 400 (241.3 Mg) MG tablet TAKE 1 TABSULE BY MOUTH DAILY 11/24/18   Hoyt Koch, MD  mexiletine (MEXITIL) 200 MG capsule Take 1 capsule (200 mg total) by mouth 2 (two) times daily. 10/10/21   Belva Crome, MD  Multiple Vitamins-Minerals (CENTRUM ADULTS PO) Take by mouth daily.    [provider]  mupirocin ointment (BACTROBAN) 2 % Apply 1 application. topically 2 (two) times daily. Apply to lower leg wounds daily prn 07/17/21   Fargo, Amy E, NP  nitroGLYCERIN (NITROSTAT) 0.4 MG SL tablet Place 1 tablet (0.4 mg total) under the tongue every 5 (five) minutes as needed for chest pain. DISSOLVE 1 TABLET UNDER THE TONGUE EVERY 5 MINUTES FOR 3 DOSES 05/08/21   Belva Crome, MD  omeprazole (PRILOSEC) 20 MG capsule Take 20 mg by mouth daily.     [provider]  potassium chloride SA (KLOR-CON M) 20 MEQ tablet Take 20 mEq by mouth once.    [provider]  Respiratory Therapy Supplies (FLUTTER) DEVI Use as directed 03/16/17   Juanito Doom, MD  rosuvastatin (CRESTOR) 40 MG tablet Take 1 tablet (40 mg total) by mouth daily. 06/05/21   [name], MD  Sennosides-Docusate Sodium (STOOL SOFTENER/LAXATIVE PO) Take 2 tablets by mouth daily.    [provider]  Spacer/Aero-Holding Chambers (OPTICHAMBER DIAMOND) [place] optichamber [facility] 09/12/19   Olalere, Ernesto Rutherford, MD  STIOLTO RESPIMAT 2.5-2.5 MCG/ACT AERS Inhale 2 puffs into the lungs daily. 09/22/21   [provider]  tamsulosin (FLOMAX) 0.4 MG CAPS capsule TAKE ONE CAPSULE BY MOUTH DAILY AFTER SUPPER 06/30/21   [name] E, NP      Allergies    Sulfa antibiotics    Review of Systems   Review of Systems  Gastrointestinal:  Positive for constipation.    Physical Exam Updated Vital Signs BP 106/67   Pulse 69   Temp  98.2 F (36.8 C) (Oral)   Resp 18   SpO2 96%  Physical Exam Exam conducted with a chaperone present.  Constitutional:      General: He is not in acute distress.    Appearance: He is not toxic-appearing or diaphoretic.  HENT:     Head: Normocephalic and atraumatic.  Cardiovascular:     Rate and Rhythm: Normal rate and regular rhythm.     Pulses: Normal pulses.     Heart sounds: Normal heart sounds.  Pulmonary:     Effort: Pulmonary effort is normal.     Breath sounds: Normal breath sounds.  Abdominal:     General: Bowel sounds are normal. There is no distension.     Palpations: Abdomen  is soft.     Tenderness: There is no abdominal tenderness. There is no guarding or rebound.  Genitourinary:    Comments: Large left-sided inguinal hernia without overlying skin changes, nontender to palpation, reducible Musculoskeletal:     Right lower leg: No edema.     Left lower leg: No edema.  Neurological:     Mental Status: He is alert and oriented to person, place, and time.     ED Results / Procedures / Treatments   Labs (all labs ordered are listed, but only abnormal results are displayed) Labs Reviewed  CBC WITH DIFFERENTIAL/PLATELET - Abnormal; Notable for the following components:      Result Value   RBC 3.23 (*)    Hemoglobin 11.1 (*)    HCT 34.4 (*)    MCV 106.5 (*)    MCH 34.4 (*)    RDW 17.7 (*)    Abs Immature Granulocytes 0.14 (*)    All other components within normal limits  COMPREHENSIVE METABOLIC PANEL - Abnormal; Notable for the following components:   Sodium 133 (*)    Glucose, Bld 118 (*)    Albumin 3.2 (*)    All other components within normal limits  URINALYSIS, ROUTINE W REFLEX MICROSCOPIC - Abnormal; Notable for the following components:   Bacteria, UA RARE (*)    All other components within normal limits  LIPASE, BLOOD    EKG None  Radiology CT ABDOMEN PELVIS W CONTRAST  Result Date: 03/08/2022 CLINICAL DATA:  Abdominal pain, constipation EXAM:  CT ABDOMEN AND PELVIS WITH CONTRAST TECHNIQUE: Multidetector CT imaging of the abdomen and pelvis was performed using the standard protocol following bolus administration of intravenous contrast. RADIATION DOSE REDUCTION: This exam was performed according to the departmental dose-optimization program which includes automated exposure control, adjustment of the mA and/or kV according to patient size and/or use of iterative reconstruction technique. CONTRAST:  10m OMNIPAQUE IOHEXOL 350 MG/ML SOLN COMPARISON:  02/25/2022 FINDINGS: Lower chest: No acute abnormality. Moderate hiatal hernia with intrathoracic gastric fundus. Coronary artery calcifications. Hepatobiliary: No solid liver abnormality is seen. No gallstones, gallbladder wall thickening, or biliary dilatation. Postprocedural pneumobilia Pancreas: Unremarkable. No pancreatic ductal dilatation or surrounding inflammatory changes. Spleen: Normal in size without significant abnormality. Adrenals/Urinary Tract: Adrenal glands are unremarkable. Kidneys are normal, without renal calculi, solid lesion, or hydronephrosis. Bladder is unremarkable. Stomach/Bowel: Stomach is within normal limits. Appendix appears normal. Sigmoid diverticulosis. Circumferential wall thickening of the rectum (series 7, image 95, series 8, image 2). Vascular/Lymphatic: Aortic atherosclerosis. 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 No enlarged abdominal or pelvic lymph nodes. Reproductive: No mass or other significant abnormality. Other: Large, incompletely imaged left inguinal hernia containing sigmoid colon without evidence of obstruction. No ascites. Musculoskeletal: No acute or significant osseous findings. Status post right hip total arthroplasty, streak artifact from which limits evaluation of the low pelvis. IMPRESSION: 1. Circumferential wall thickening of the rectum, suggestive of nonspecific infectious or inflammatory  proctitis. Underlying mass is not excluded. Note that evaluation of the low pelvis has generally been significantly limited on this and prior examinations by the presence of dense metallic streak artifact from adjacent hip arthroplasty. 2. Large, incompletely imaged left inguinal hernia containing sigmoid colon without evidence of obstruction. 3. Sigmoid diverticulosis without evidence of acute diverticulitis. 4. Hiatal hernia. 5. 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 Recommend follow-up ultrasound every 3 years. This recommendation follows ACR consensus guidelines:  White Paper of the ACR Incidental Findings Committee II on Vascular Findings. J Am Coll Radiol 2013; 10:789-794. 6. Coronary artery disease. Aortic Atherosclerosis (ICD10-I70.0). Electronically Signed   By: Delanna Ahmadi M.D.   On: 03/08/2022 18:22    Procedures Procedures    Medications Ordered in ED Medications  iohexol (OMNIPAQUE) 350 MG/ML injection 75 mL (75 mLs Intravenous Contrast Given 03/08/22 1752)    ED Course/ Medical Decision Making/ A&P Clinical Course as of 03/08/22 2013  Sun Mar 08, 2022  1844 WBC: 6.2 [VB]  1863 81 year old who presented with complaints of constipation.  He is incredibly well-appearing and hemodynamically stable with a soft abdomen and no tenderness and a known left inguinal hernia with no pain on palpation or attempts of reduction and no external skin changes or signs of incarceration or strangulation.  He had a bowel movement while in the ED and felt tremendously better.  He has known chronic AAA that was unchanged on CT scan and no signs or symptoms suggestive of rupture.  CTAP also showed evidence of known left inguinal hernia and possible inflammatory proctitis.  No concerns for infectious proctitis, he likely has some inflammation from hard stools.  Starting patient on bowel regimen, he has a GI doctor who he will follow-up with regarding possible mass although  less likely as he has had a previous colonoscopy last year per patient.  Referral to general surgery made for inguinal hernia and information for PCP follow-up was also provided.  He is in agreement with plan and safe for discharge.  He is tolerating p.o. without vomiting or nausea. [VB]    Clinical Course User Index [VB] Elgie Congo, MD                           Medical Decision Making Amount and/or Complexity of Data Reviewed Labs: ordered. Decision-making details documented in ED Course. Radiology: ordered and independent interpretation performed. Decision-making details documented in ED Course.  Risk OTC drugs.   Patient presents as above.  Differential diagnosis includes but is not limited to constipation, obstruction 2/2 inguinal hernia, ileus.  He is overall well-appearing.  His hernia is reducible, it is not incarcerated or strangulated on exam.  CBC with hemoglobin 11.1, which is stable from prior.  No leukocytosis, differential overall unremarkable.  CMP with slightly low sodium at 133, glucose 118, albumin slightly low at 3.2, otherwise completely within normal limits.  UA with rare bacteria, otherwise unremarkable.  Lipase within normal limits at 34.  Patient's CT abdomen pelvis showed some thickening of the rectum.  Patient does not have urinary symptoms, while he does have rare bacteria on UA he has no WBCs, leukocytes, nitrate.  He states that he did have some pain with defecation but only "on the part where the poop was hard."  I do not think this is infectious proctitis.  Perhaps he has some inflammatory changes 2/2 his constipation.  Patient does have diverticulosis on CT scan.  No diverticulitis.  Patient has known chronic changes to his infrarenal abdominal aorta that will require follow-up ultrasound every 3 years.    Patient had a large bowel movement while awaiting lab and imaging results.  On my reevaluation, he stated he felt significantly improved.  He did not  have any blood in his stool.  I discussed the patient's CT scan findings with him.  He reports that he does have a GI doctor who has been following him.  Encouraged him to reach out to his GI doctor to discuss his findings.  He has been evaluated by surgery for management of his inguinal hernia.  I encouraged him to reach back out to his surgeon to be reevaluated for this.  Gave him contact information for [facility] should he not be able to reach his surgeon.  Patient is stable for discharge at this time.  His presentation is consistent with constipation.  We discussed his current bowel regimen and I encouraged him to incorporate MiraLAX into his bowel regimen at this time.  He will follow-up with his PCP, gastroenterologist, and surgeon.         Final Clinical Impression(s) / ED Diagnoses Final diagnoses:  Constipation, unspecified constipation type  Unilateral inguinal hernia without obstruction or gangrene, recurrence not specified    Rx / DC Orders ED Discharge Orders          Ordered    polyethylene glycol powder (GLYCOLAX/MIRALAX) 17 GM/SCOOP powder        03/08/22 1927    senna-docusate (SENOKOT-S) 8.6-50 MG tablet  Daily        03/08/22 1927              Luster Landsberg, MD 03/08/22 2319    Elgie Congo, MD 03/08/22 2330

## 2022-03-08 NOTE — ED Notes (Signed)
Pt transported to CT ?

## 2022-03-08 NOTE — ED Notes (Signed)
Pt went to the bathroom and states that he had a decent sized bowel movement

## 2022-03-09 ENCOUNTER — Inpatient Hospital Stay: Payer: Medicare Other | Admitting: Orthopedic Surgery

## 2022-03-09 ENCOUNTER — Encounter: Payer: Self-pay | Admitting: Interventional Cardiology

## 2022-03-09 DIAGNOSIS — I25810 Atherosclerosis of coronary artery bypass graft(s) without angina pectoris: Secondary | ICD-10-CM

## 2022-03-09 DIAGNOSIS — I5022 Chronic systolic (congestive) heart failure: Secondary | ICD-10-CM

## 2022-03-11 MED ORDER — CARVEDILOL 3.125 MG PO TABS: 3.1250 mg | ORAL_TABLET | Freq: Two times a day (BID) | ORAL | 3 refills | Status: DC

## 2022-03-12 ENCOUNTER — Encounter: Payer: Self-pay | Admitting: Orthopedic Surgery

## 2022-03-12 ENCOUNTER — Ambulatory Visit (INDEPENDENT_AMBULATORY_CARE_PROVIDER_SITE_OTHER): Payer: Medicare Other | Admitting: Orthopedic Surgery

## 2022-03-12 ENCOUNTER — Encounter: Payer: Medicare Other | Admitting: Orthopedic Surgery

## 2022-03-12 VITALS — BP 102/75 | HR 67 | Temp 97.5°F | Resp 18 | Ht 72.0 in | Wt 192.0 lb

## 2022-03-12 DIAGNOSIS — Z8781 Personal history of (healed) traumatic fracture: Secondary | ICD-10-CM

## 2022-03-12 DIAGNOSIS — R1013 Epigastric pain: Secondary | ICD-10-CM

## 2022-03-12 DIAGNOSIS — M545 Low back pain, unspecified: Secondary | ICD-10-CM

## 2022-03-12 DIAGNOSIS — R2681 Unsteadiness on feet: Secondary | ICD-10-CM | POA: Diagnosis not present

## 2022-03-12 DIAGNOSIS — I714 Abdominal aortic aneurysm, without rupture, unspecified: Secondary | ICD-10-CM

## 2022-03-12 DIAGNOSIS — J449 Chronic obstructive pulmonary disease, unspecified: Secondary | ICD-10-CM

## 2022-03-12 DIAGNOSIS — K4090 Unilateral inguinal hernia, without obstruction or gangrene, not specified as recurrent: Secondary | ICD-10-CM

## 2022-03-12 DIAGNOSIS — K5909 Other constipation: Secondary | ICD-10-CM

## 2022-03-12 MED ORDER — GABAPENTIN 300 MG PO CAPS: 300.0000 mg | ORAL_CAPSULE | Freq: Two times a day (BID) | ORAL | 2 refills | Status: DC

## 2022-03-12 NOTE — Patient Instructions (Addendum)
Continue stool softener and miralax daily  If you have not had a bowel movement every other day- take another dose of miralax in evening  Avoid caffeine and drink more water they will dehydrate you

## 2022-03-12 NOTE — Progress Notes (Signed)
Careteam: Patient Care Team: Steven Alanis, NP as PCP - General (Adult Health Nurse Practitioner) Steven Lance, MD as PCP - Electrophysiology (Cardiology) Steven Klein, MD as PCP - Cardiology (Cardiology) Steven Cluck, MD as Referring Physician (Ophthalmology) Steven Sine, MD as Consulting Physician (Dermatology) Steven Shipper, MD as Consulting Physician (Gastroenterology)  Seen by: Steven Guzman, AGNP-C  PLACE OF SERVICE:  [place] Directive information    Allergies  Allergen Reactions   Sulfa Antibiotics Hives    Chief Complaint  Patient presents with   Hospitalization Follow-up    Hospital Follow up visit. Steven Guzman was diagnose with COVID-COPD Exacerbation on 02-24-2022.      HPI: Patient is a 81 y.o. male seen today for f/u s/p ED visit 12/10.   Numerous ED visits/ hospitalizations in past 6 weeks.   11/11 right hemi arthroplasty. He was at a SNF facility x 1 week. He is now doing [facility] PT, first session today. Ambulates with FWW for long distances. Uses cane at home. Still has stitches in place- refused to have them removed today. Denies pain. F/u 12/29 with surgeon.   He got covid while in SNF. Denies long term symptoms.   Ongoing epigastric pain. He does have a protruding inguinal hernia. He would like to see general surgeon after ambulation improves. Moving bowels well. Constipation worsens gastric/hernia pain.   Flu vaccine administered last hospitalization.   He lives in [place]. He is looking to find a provider closer to home.    Review of Systems:  Review of Systems  Constitutional:  Negative for chills, fever, malaise/fatigue and weight loss.  HENT:  Negative for congestion.   Eyes:  Negative for blurred vision and double vision.  Respiratory:  Positive for cough and sputum production. Negative for shortness of breath and wheezing.   Cardiovascular:  Positive for leg swelling. Negative for chest  pain.  Gastrointestinal:  Positive for constipation. Negative for abdominal pain, blood in stool, diarrhea, heartburn, nausea and vomiting.       Inguinal hernia  Genitourinary:  Negative for dysuria.  Musculoskeletal:  Negative for falls and joint pain.  Skin:  Negative for rash.  Neurological:  Negative for dizziness, weakness and headaches.  Psychiatric/Behavioral:  Negative for depression and memory loss. The patient is not nervous/anxious and does not have insomnia.     Past Medical History:  Diagnosis Date   AICD (automatic cardioverter/defibrillator) present    Aneurysm ([place])    a. Aneurysmal infrarenal aorta up to 33 mm on CT 10/2014, recommended f/u due 10/2017   Anginal pain ([place])    Anxiety    Basal cell carcinoma of nose    S/P MOHS   Biliary acute pancreatitis    CAD (coronary artery disease)    a. s/p MI in 1994 with PCI to LAD at that time b. cath 10/2012 demonstrated EF 30%, inferior akinesis with mild hypokinesis of all walls, patent LAD and RCA stents; ostial PDA with 80-90% obstruction with medical therapy recommended    Chronic systolic CHF (congestive heart failure) (HCC)    EF 30 to 35 % as of 09/2014.    CKD (chronic kidney disease), stage III ([place])    Complication of anesthesia 10/2014   "had to have defibrillator w/ERCP"   COPD (chronic obstructive pulmonary disease) ([place])    a. followed by pulmonary, COPD GOLD stage II   Depression    Diverticulosis of colon 07/2014   noted  on CT   GERD (gastroesophageal reflux disease)    Hiatal hernia    Hyperglycemia 10/2012.   Hyperlipidemia    Hypertension    Myocardial infarction [facility]) 1994; 2011   Pneumonia 1946; 2015   Prostate enlargement 07/2014   observed on CT   Tobacco abuse    Ventricular tachycardia ([place])    a. 08/2009 s/p BSX E110 Teligen 100 AICD, ser#: [id number];  b. 08/2008 VT req ATP - detection reprogrammed from 160 to 150. c. EPS and VT ablation by Dr. Lovena Le 12/21/2014   Past Surgical History:   Procedure Laterality Date   BIOPSY  12/21/2017   Procedure: BIOPSY;  Surgeon: Steven Shipper, MD;  Location: [facility];  Service: Endoscopy;;   CATARACT EXTRACTION W/ INTRAOCULAR LENS  IMPLANT, BILATERAL Bilateral ~ 2011   COLONOSCOPY     COLONOSCOPY WITH PROPOFOL N/A 12/21/2017   Procedure: COLONOSCOPY WITH PROPOFOL;  Surgeon: Steven Shipper, MD;  Location: [facility];  Service: Endoscopy;  Laterality: N/A;   ELECTROPHYSIOLOGIC STUDY N/A 12/21/2014   Procedure: V Tach Ablation;  Surgeon: Steven Lance, MD;  Location: [place] [facility];  Service: Cardiovascular;  Laterality: N/A;   ERCP N/A 11/16/2014   Procedure: ENDOSCOPIC RETROGRADE CHOLANGIOPANCREATOGRAPHY (ERCP);  Surgeon: [name], MD;  Location: [place];  Service: Endoscopy;  Laterality: N/A;   ESOPHAGOGASTRODUODENOSCOPY (EGD) WITH PROPOFOL N/A 12/21/2017   Procedure: ESOPHAGOGASTRODUODENOSCOPY (EGD) WITH PROPOFOL;  Surgeon: Steven Shipper, MD;  [facility];  Service: Endoscopy;  Laterality: N/A;   EYE SURGERY     FOOT SURGERY Left 2005   "fixed bone that stuck out in my ankle area"   HEMORRHOID BANDING     IMPLANTABLE CARDIOVERTER DEFIBRILLATOR IMPLANT  09/06/09   BSX dual chamber ICD implanted in [place] for cardiac arrest 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   [place] Right ~ [place] N/A 11/25/2012   demonstrated EF 30%, inferior akinesis with mild hypokinesis of all walls, patent LAD and RCA stents; ostial PDA with 80-90% obstruction with medical therapy recommended   MOHS SURGERY  2008   nose, skin graft   POLYPECTOMY  12/21/2017   Procedure: POLYPECTOMY;  Surgeon: Steven Shipper, MD;  [facility];  Service: Endoscopy;;   RETINAL DETACHMENT SURGERY Right 2013   TENOLYSIS Right 12/21/2013   Procedure: TENOLYSIS FLEXOR CARPI RADIALIS ,DEBRIDEMENT RIGHT JOINT WRIST,DEBRIDEMENT SCAPHOTRAPEZIAL TRAPEZOID, REPAIR OF EXTENSOR HOOD;  Surgeon: Daryll Brod, MD;  Location: [place];  Service: Orthopedics;  Laterality: Right;   TOE SURGERY Right 09/2019   3rd toe/hammer toe   V-TACH ABLATION  12/21/2014   VIDEO BRONCHOSCOPY Bilateral 01/09/2016   Procedure: VIDEO BRONCHOSCOPY WITHOUT FLUORO;  Surgeon: Juanito Doom, MD;  [facility];  Service: Cardiopulmonary;  Laterality: Bilateral;   Social History:   reports that he has quit smoking. His smoking use included cigarettes. He has a 55.00 pack-year smoking history. He has never used smokeless tobacco. He reports current alcohol use. He reports that he does not use drugs.  Family History  Problem Relation Age of Onset   Heart attack Brother    CAD Father    Hypertension Father    CAD Mother    Hypertension Mother    Hypertension Brother    Stroke Neg Hx     Medications: Patient's Medications  New Prescriptions   No medications on file  Previous Medications   ALBUTEROL (PROVENTIL) (2.5 MG/3ML) 0.083%  NEBULIZER SOLUTION    USE 1 VIAL VIA NEBULIZER EVERY 6 HOURS AS NEEDED FOR WHEEZING OR SHORTNESS OF BREATH   AMIODARONE (PACERONE) 200 MG TABLET    TAKE ONE TABLET BY MOUTH DAILY   APIXABAN (ELIQUIS) 5 MG TABS TABLET    Take 1 tablet (5 mg total) by mouth 2 (two) times daily.   ARTIFICIAL TEAR SOLUTION (SOOTHE XP OP)    Place 2 drops into both eyes daily as needed (dry eyes).   ASCORBIC ACID (VITAMIN C) 500 MG TABLET    Take 1 tablet by mouth daily.   BENAZEPRIL (LOTENSIN) 10 MG TABLET    Take 0.5 tablets (5 mg total) by mouth daily.   BUPROPION (WELLBUTRIN) 75 MG TABLET    TAKE ONE TABLET BY MOUTH DAILY   BUSPIRONE (BUSPAR) 15 MG TABLET    Take 15 mg by mouth 3 (three) times daily. Patient taking 10 mg by mouth twice daily   CALCIUM CITRATE (CALCITRATE - DOSED IN MG ELEMENTAL CALCIUM) 950 (200 CA) MG TABLET    Take by mouth.   CARVEDILOL (COREG) 3.125 MG TABLET    Take 1 tablet (3.125 mg total) by mouth 2 (two) times daily.   CETIRIZINE (ZYRTEC) 10 MG  TABLET    Take 1 tablet by mouth daily.   CYANOCOBALAMIN (VITAMIN B-12) 5000 MCG SUBL    Place under the tongue daily.   ESCITALOPRAM (LEXAPRO) 20 MG TABLET    Take 20 mg by mouth daily. Take 1/2 tablet (10 mg total ) by mouth daily   FLUTICASONE (FLONASE) 50 MCG/ACT NASAL SPRAY    Place 2 sprays into both nostrils daily.   FUROSEMIDE (LASIX) 40 MG TABLET    Take 0.5 tablets (20 mg total) by mouth daily.   GABAPENTIN (NEURONTIN) 300 MG CAPSULE    Take 400 mg by mouth 2 (two) times daily. Pt take 1 tablet 300 mg at night and 100 mg in the morning.   GUAIFENESIN (MUCINEX) 600 MG 12 HR TABLET    Take 1 tablet by mouth 2 (two) times daily.   IPRATROPIUM-ALBUTEROL (COMBIVENT RESPIMAT) 20-100 MCG/ACT AERS RESPIMAT    Inhale 1 puff into the lungs every 6 (six) hours. Shortness of breath or wheezing   IRON PO    Take by mouth daily.    MAGNESIUM-OXIDE 400 (241.3 MG) MG TABLET    TAKE 1 TABSULE BY MOUTH DAILY   MEXILETINE (MEXITIL) 200 MG CAPSULE    Take 1 capsule (200 mg total) by mouth 2 (two) times daily.   MULTIPLE VITAMINS-MINERALS (CENTRUM ADULTS PO)    Take by mouth daily.   MUPIROCIN OINTMENT (BACTROBAN) 2 %    Apply 1 application. topically 2 (two) times daily. Apply to lower leg wounds daily prn   NITROGLYCERIN (NITROSTAT) 0.4 MG SL TABLET    Place 1 tablet (0.4 mg total) under the tongue every 5 (five) minutes as needed for chest pain. DISSOLVE 1 TABLET UNDER THE TONGUE EVERY 5 MINUTES FOR 3 DOSES   OMEPRAZOLE (PRILOSEC) 20 MG CAPSULE    Take 20 mg by mouth daily.    POLYETHYLENE GLYCOL POWDER (GLYCOLAX/MIRALAX) 17 GM/SCOOP POWDER    Take 1 capful in 8 ounces of fluid each morning   POTASSIUM CHLORIDE SA (KLOR-CON M) 20 MEQ TABLET    Take 20 mEq by mouth once.   RESPIRATORY THERAPY SUPPLIES (FLUTTER) DEVI    Use as directed   ROSUVASTATIN (CRESTOR) 40 MG TABLET    Take 1 tablet (40 mg  total) by mouth daily.   SENNOSIDES-DOCUSATE SODIUM (STOOL SOFTENER/LAXATIVE PO)    Take 2 tablets by mouth  daily.   SPACER/AERO-HOLDING CHAMBERS (OPTICHAMBER DIAMOND) MISC    optichamber VHC   STIOLTO RESPIMAT 2.5-2.5 MCG/ACT AERS    Inhale 2 puffs into the lungs daily.   TAMSULOSIN (FLOMAX) 0.4 MG CAPS CAPSULE    TAKE ONE CAPSULE BY MOUTH DAILY AFTER SUPPER  Modified Medications   No medications on file  Discontinued Medications   AMOXICILLIN-CLAVULANATE (AUGMENTIN) 875-125 MG TABLET    Take 1 tablet by mouth every 12 (twelve) hours.   BENZONATATE (TESSALON) 100 MG CAPSULE    Take 1 capsule by mouth 3 (three) times daily as needed.   SENNA-DOCUSATE (SENOKOT-S) 8.6-50 MG TABLET    Take 1 tablet by mouth daily.    Physical Exam:  Vitals:   03/12/22 1314  BP: 102/75  Pulse: 67  Resp: 18  Temp: (!) 97.5 F (36.4 C)  SpO2: 96%  Weight: 192 lb (87.1 kg)  Height: 6' (1.829 m)   Body mass index is 26.04 kg/m. Wt Readings from Last 3 Encounters:  03/12/22 192 lb (87.1 kg)  02/25/22 195 lb (88.5 kg)  12/19/21 202 lb 3.2 oz (91.7 kg)    Physical Exam Vitals reviewed.  Constitutional:      General: He is not in acute distress. HENT:     Head: Normocephalic.  Eyes:     General:        Right eye: No discharge.        Left eye: No discharge.  Cardiovascular:     Rate and Rhythm: Normal rate and regular rhythm.     Pulses: Normal pulses.     Heart sounds: Normal heart sounds.  Pulmonary:     Effort: Pulmonary effort is normal. No respiratory distress.     Breath sounds: Normal breath sounds. No wheezing or rales.  Abdominal:     General: Bowel sounds are normal. There is no distension.     Palpations: Abdomen is soft.     Tenderness: There is no abdominal tenderness.     Comments: Inguinal hernia to LLQ, soft/non-tender to touch  Musculoskeletal:     Cervical back: Neck supple.     Right hip: No tenderness or crepitus. Normal range of motion. Normal strength.     Right lower leg: Edema present.     Left lower leg: Edema present.     Comments: Non pitting lower extremities,  bilateral dorsiflexion 5/5  Skin:    General: Skin is warm and dry.     Capillary Refill: Capillary refill takes less than 2 seconds.     Comments: Right posterior hip incision CDI, incision appears closed, sutures in place, no sign of infection.   Neurological:     General: No focal deficit present.     Mental Status: He is alert and oriented to person, place, and time.     Motor: Weakness present.     Gait: Gait abnormal.  Psychiatric:        Mood and Affect: Mood normal.        Behavior: Behavior normal.     Labs reviewed: Basic Metabolic Panel: Recent Labs    04/01/21 1254 04/16/21 1407 09/18/21 1605 02/25/22 1435 03/08/22 1232  NA 139   < > 139 138 133*  K 4.6   < > 4.4 4.2 4.3  CL 102   < > 103 96* 99  CO2 26   < > 27  29 25  GLUCOSE 95   < > 75 115* 118*  BUN 12   < > '11 17 11  '$ CREATININE 1.01   < > 0.91 0.88 0.92  CALCIUM 9.5   < > 9.5 9.7 9.1  TSH 2.360  --   --   --   --    < > = values in this interval not displayed.   Liver Function Tests: Recent Labs    07/14/21 1026 09/18/21 1605 02/25/22 1435 03/08/22 1232  AST 33 '26 31 28  '$ ALT 33 '27 25 31  '$ ALKPHOS 54  --  53 57  BILITOT 0.7 0.8 0.7 0.9  PROT 6.1 6.6 6.5 7.0  ALBUMIN 4.1  --  3.5 3.2*   Recent Labs    02/25/22 1435 03/08/22 1232  LIPASE 40 34   No results for input(s): "AMMONIA" in the last 8760 hours. CBC: Recent Labs    09/18/21 1605 02/25/22 1435 03/08/22 1232  WBC 4.6 4.1 6.2  NEUTROABS 2,953  --  4.4  HGB 13.3 11.2* 11.1*  HCT 39.1 34.8* 34.4*  MCV 101.8* 107.7* 106.5*  PLT 132* 194 180   Lipid Panel: Recent Labs    04/01/21 1254 06/04/21 1036 07/14/21 1026  CHOL 195 162 117  HDL 52 58 47  LDLCALC 129* 88 53  TRIG 78 83 88  CHOLHDL 3.8 2.8 2.5   TSH: Recent Labs    04/01/21 1254  TSH 2.360   A1C: Lab Results  Component Value Date   HGBA1C 5.6 12/29/2016     Assessment/Plan 1. S/P right hip fracture - 11/11 right hemi arthroplasty - WBAT - no pain-  off norco - incision closed - stitches still in place- refused removal - f/u with surgeon soon - cont PT  2. Epigastric pain - ongoing - followed by GI - hgb 11.1 - cont omeprazole - ? associated with inguinal hernia  3. Left inguinal hernia - CT abdomen large left inguinal hernia containing sigmoid colon without obstruction, possible mass cannot be ruled out 03/08/2022 - followed by GI - referral to general surgery made - hernia to LLQ, soft - increased pain with constipation - cont daily senna and miralax - advised to take extra dose miralax if no BM x 2 days - discussed increasing hydration with water  4. Unstable gait - s/p right hemi arthroplasty - no recent falls - cont PT  5. COPD mixed type ([place]) - admits to smoking occasional cigarette within past 3 months - no recent exacerbations - cont Albuterol, Stiolto and mucinex  6. Low back pain, unspecified back pain laterality, unspecified chronicity, unspecified whether sciatica present - ongoing - gabapentin (NEURONTIN) 300 MG capsule; Take 1 capsule (300 mg total) by mouth 2 (two) times daily. Pt take 1 tablet 300 mg at night and 100 mg in the morning.  Dispense: 120 capsule; Refill: 2  7. Chronic constipation - see above  8. AAA, without rupture - noted on CT abdomen 12/10 - no change in size of sign of rupture  Total time: 36 minutes. Greater than 50% of total time spent doing patient education regarding hip fracture, inguinal hernia, constipation, and health maintenance including vaccinations.    Next appt: 07/16/2022  Steven Guzman, [facility] [phone]

## 2022-03-13 ENCOUNTER — Ambulatory Visit: Payer: Medicare Other | Attending: Internal Medicine | Admitting: Internal Medicine

## 2022-03-13 ENCOUNTER — Encounter: Payer: Self-pay | Admitting: Internal Medicine

## 2022-03-13 VITALS — BP 112/68 | HR 71 | Ht 72.0 in | Wt 192.0 lb

## 2022-03-13 DIAGNOSIS — I4891 Unspecified atrial fibrillation: Secondary | ICD-10-CM | POA: Diagnosis not present

## 2022-03-13 DIAGNOSIS — I5022 Chronic systolic (congestive) heart failure: Secondary | ICD-10-CM | POA: Diagnosis not present

## 2022-03-13 DIAGNOSIS — Z95810 Presence of automatic (implantable) cardiac defibrillator: Secondary | ICD-10-CM | POA: Diagnosis not present

## 2022-03-13 LAB — CUP PACEART INCLINIC DEVICE CHECK
Date Time Interrogation Session: 20231215164422
HighPow Impedance: 43 Ohm
HighPow Impedance: 52 Ohm
Implantable Lead Connection Status: 753985
Implantable Lead Connection Status: 753985
Implantable Lead Implant Date: 20110610
Implantable Lead Implant Date: 20110610
Implantable Lead Location: 753859
Implantable Lead Location: 753860
Implantable Lead Model: 185
Implantable Lead Model: 4135
Implantable Lead Serial Number: 28681386
Implantable Lead Serial Number: 339643
Implantable Pulse Generator Implant Date: 20110610
Lead Channel Impedance Value: 428 Ohm
Lead Channel Impedance Value: 621 Ohm
Lead Channel Pacing Threshold Amplitude: 0.9 V
Lead Channel Pacing Threshold Amplitude: 1.8 V
Lead Channel Pacing Threshold Pulse Width: 0.4 ms
Lead Channel Pacing Threshold Pulse Width: 0.4 ms
Lead Channel Sensing Intrinsic Amplitude: 13 mV
Lead Channel Sensing Intrinsic Amplitude: 3.2 mV
Lead Channel Setting Pacing Amplitude: 2 V
Lead Channel Setting Pacing Amplitude: 2.2 V
Lead Channel Setting Pacing Pulse Width: 0.4 ms
Lead Channel Setting Sensing Sensitivity: 0.6 mV
Pulse Gen Serial Number: 164892

## 2022-03-13 NOTE — Addendum Note (Signed)
Addended by: Oleta Mouse C on: 03/13/2022 04:50 PM   Modules accepted: Orders

## 2022-03-13 NOTE — Progress Notes (Signed)
HPI Steven Guzman returns today for ongoing followup of his VT and PAF. He is a pleasant 81 yo man with a h/o COPD, CAD, and anemia. He has been over a year since being seen in our EP clinic. He has not had any ICD shocks. He has class 2 dyspnea which is multifactorial. He has not had syncope. He has stopped smoking.  Allergies  Allergen Reactions   Sulfa Antibiotics Hives     Current Outpatient Medications  Medication Sig Dispense Refill   albuterol (PROVENTIL) (2.5 MG/3ML) 0.083% nebulizer solution USE 1 VIAL VIA NEBULIZER EVERY 6 HOURS AS NEEDED FOR WHEEZING OR SHORTNESS OF BREATH 360 mL 0   amiodarone (PACERONE) 200 MG tablet TAKE ONE TABLET BY MOUTH DAILY 30 tablet 6   apixaban (ELIQUIS) 5 MG TABS tablet Take 1 tablet (5 mg total) by mouth 2 (two) times daily. 180 tablet 1   Artificial Tear Solution (SOOTHE XP OP) Place 2 drops into both eyes daily as needed (dry eyes).     ascorbic acid (VITAMIN C) 500 MG tablet Take 1 tablet by mouth daily.     benazepril (LOTENSIN) 10 MG tablet Take 0.5 tablets (5 mg total) by mouth daily. 45 tablet 3   buPROPion (WELLBUTRIN) 75 MG tablet TAKE ONE TABLET BY MOUTH DAILY 90 tablet 2   busPIRone (BUSPAR) 15 MG tablet Take 15 mg by mouth 3 (three) times daily. Patient taking 10 mg by mouth twice daily     calcium citrate (CALCITRATE - DOSED IN MG ELEMENTAL CALCIUM) 950 (200 Ca) MG tablet Take by mouth.     carvedilol (COREG) 3.125 MG tablet Take 1 tablet (3.125 mg total) by mouth 2 (two) times daily. 180 tablet 3   cetirizine (ZYRTEC) 10 MG tablet Take 1 tablet by mouth daily.     Cyanocobalamin (VITAMIN B-12) 5000 MCG SUBL Place under the tongue daily.     escitalopram (LEXAPRO) 20 MG tablet Take 20 mg by mouth daily. Take 1/2 tablet (10 mg total ) by mouth daily     fluticasone (FLONASE) 50 MCG/ACT nasal spray Place 2 sprays into both nostrils daily. 16 g 6   furosemide (LASIX) 40 MG tablet Take 0.5 tablets (20 mg total) by mouth daily. 30 tablet  3   gabapentin (NEURONTIN) 300 MG capsule Take 1 capsule (300 mg total) by mouth 2 (two) times daily. Pt take 1 tablet 300 mg at night and 100 mg in the morning. 120 capsule 2   guaiFENesin (MUCINEX) 600 MG 12 hr tablet Take 1 tablet by mouth 2 (two) times daily.     Ipratropium-Albuterol (COMBIVENT RESPIMAT) 20-100 MCG/ACT AERS respimat Inhale 1 puff into the lungs every 6 (six) hours. Shortness of breath or wheezing 4 g 5   IRON PO Take by mouth daily.      MAGNESIUM-OXIDE 400 (241.3 Mg) MG tablet TAKE 1 TABSULE BY MOUTH DAILY 90 tablet 3   mexiletine (MEXITIL) 200 MG capsule Take 1 capsule (200 mg total) by mouth 2 (two) times daily. 180 capsule 3   Multiple Vitamins-Minerals (CENTRUM ADULTS PO) Take by mouth daily.     mupirocin ointment (BACTROBAN) 2 % Apply 1 application. topically 2 (two) times daily. Apply to lower leg wounds daily prn 22 g 1   nitroGLYCERIN (NITROSTAT) 0.4 MG SL tablet Place 1 tablet (0.4 mg total) under the tongue every 5 (five) minutes as needed for chest pain. DISSOLVE 1 TABLET UNDER THE TONGUE EVERY 5 MINUTES FOR 3  DOSES 25 tablet 11   omeprazole (PRILOSEC) 20 MG capsule Take 20 mg by mouth daily.      polyethylene glycol powder (GLYCOLAX/MIRALAX) 17 GM/SCOOP powder Take 1 capful in 8 ounces of fluid each morning 255 g 0   potassium chloride SA (KLOR-CON M) 20 MEQ tablet Take 20 mEq by mouth once.     Respiratory Therapy Supplies (FLUTTER) DEVI Use as directed 1 each 0   rosuvastatin (CRESTOR) 40 MG tablet Take 1 tablet (40 mg total) by mouth daily. 90 tablet 3   Sennosides-Docusate Sodium (STOOL SOFTENER/LAXATIVE PO) Take 2 tablets by mouth daily.     Spacer/Aero-Holding Chambers (OPTICHAMBER DIAMOND) MISC optichamber VHC 1 each 0   STIOLTO RESPIMAT 2.5-2.5 MCG/ACT AERS Inhale 2 puffs into the lungs daily.     tamsulosin (FLOMAX) 0.4 MG CAPS capsule TAKE ONE CAPSULE BY MOUTH DAILY AFTER SUPPER 90 capsule 3   No current facility-administered medications for this  visit.     Past Medical History:  Diagnosis Date   AICD (automatic cardioverter/defibrillator) present    Aneurysm ([place])    a. Aneurysmal infrarenal aorta up to 33 mm on CT 10/2014, recommended f/u due 10/2017   Anginal pain ([place])    Anxiety    Basal cell carcinoma of nose    S/P MOHS   Biliary acute pancreatitis    CAD (coronary artery disease)    a. s/p MI in 1994 with PCI to LAD at that time b. cath 10/2012 demonstrated EF 30%, inferior akinesis with mild hypokinesis of all walls, patent LAD and RCA stents; ostial PDA with 80-90% obstruction with medical therapy recommended    Chronic systolic CHF (congestive heart failure) (HCC)    EF 30 to 35 % as of 09/2014.    CKD (chronic kidney disease), stage III ([place])    Complication of anesthesia 10/2014   "had to have defibrillator w/ERCP"   COPD (chronic obstructive pulmonary disease) ([place])    a. followed by pulmonary, COPD GOLD stage II   Depression    Diverticulosis of colon 07/2014   noted on CT   GERD (gastroesophageal reflux disease)    Hiatal hernia    Hyperglycemia 10/2012.   Hyperlipidemia    Hypertension    Myocardial infarction [facility]) 1994; 2011   Pneumonia 1946; 2015   Prostate enlargement 07/2014   observed on CT   Tobacco abuse    Ventricular tachycardia ([place])    a. 08/2009 s/p BSX E110 Teligen 100 AICD, ser#: [id number];  b. 08/2008 VT req ATP - detection reprogrammed from 160 to 150. c. EPS and VT ablation by Dr. Lovena Le 12/21/2014    ROS:   All systems reviewed and negative except as noted in the HPI.   Past Surgical History:  Procedure Laterality Date   BIOPSY  12/21/2017   Procedure: BIOPSY;  Surgeon: [name], MD;  Location: [facility];  Service: Endoscopy;;   CATARACT EXTRACTION W/ INTRAOCULAR LENS  IMPLANT, BILATERAL Bilateral ~ 2011   COLONOSCOPY     COLONOSCOPY WITH PROPOFOL N/A 12/21/2017   Procedure: COLONOSCOPY WITH PROPOFOL;  Surgeon: [name], MD;  Location: [facility];  Service: Endoscopy;   Laterality: N/A;   ELECTROPHYSIOLOGIC STUDY N/A 12/21/2014   Procedure: V Tach Ablation;  Surgeon: [name], MD;  Location: [facility];  Service: Cardiovascular;  Laterality: N/A;   ERCP N/A 11/16/2014   Procedure: ENDOSCOPIC RETROGRADE CHOLANGIOPANCREATOGRAPHY (ERCP);  Surgeon: [name], MD;  Location: [facility];  Service:  Endoscopy;  Laterality: N/A;   ESOPHAGOGASTRODUODENOSCOPY (EGD) WITH PROPOFOL N/A 12/21/2017   Procedure: ESOPHAGOGASTRODUODENOSCOPY (EGD) WITH PROPOFOL;  Surgeon: [name], MD;  [facility];  Service: Endoscopy;  Laterality: N/A;   EYE SURGERY     FOOT SURGERY Left 2005   "fixed bone that stuck out in my ankle area"   HEMORRHOID BANDING     IMPLANTABLE CARDIOVERTER DEFIBRILLATOR IMPLANT  09/06/09   BSX dual chamber ICD implanted in [place] for cardiac arrest 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   [place] Right ~ [facility] N/A 11/25/2012   demonstrated EF 30%, inferior akinesis with mild hypokinesis of all walls, patent LAD and RCA stents; ostial PDA with 80-90% obstruction with medical therapy recommended   MOHS SURGERY  2008   nose, skin graft   POLYPECTOMY  12/21/2017   Procedure: POLYPECTOMY;  Surgeon: [name], MD;  [facility];  Service: Endoscopy;;   RETINAL DETACHMENT SURGERY Right 2013   TENOLYSIS Right 12/21/2013   Procedure: TENOLYSIS FLEXOR CARPI RADIALIS ,DEBRIDEMENT RIGHT JOINT WRIST,DEBRIDEMENT SCAPHOTRAPEZIAL TRAPEZOID, REPAIR OF EXTENSOR HOOD;  Surgeon: Daryll Brod, MD;  Location: [facility];  Service: Orthopedics;  Laterality: Right;   TOE SURGERY Right 09/2019   3rd toe/hammer toe   V-TACH ABLATION  12/21/2014   VIDEO BRONCHOSCOPY Bilateral 01/09/2016   Procedure: VIDEO BRONCHOSCOPY WITHOUT FLUORO;  Surgeon: Juanito Doom, MD;  [facility];  Service: Cardiopulmonary;  Laterality: Bilateral;     Family History  Problem  Relation Age of Onset   Heart attack Brother    CAD Father    Hypertension Father    CAD Mother    Hypertension Mother    Hypertension Brother    Stroke Neg Hx      Social History   Socioeconomic History   Marital status: Married    Spouse name: Not on file   Number of children: Not on file   Years of education: Not on file   Highest education level: Not on file  Occupational History   Occupation: Retired  Tobacco Use   Smoking status: Former    Packs/day: 1.00    Years: 55.00    Total pack years: 55.00    Types: Cigarettes   Smokeless tobacco: Never   Tobacco comments:    off/on, always ready to quit but does not work out  Scientific laboratory technician Use: Never used  Substance and Sexual Activity   Alcohol use: Yes    Alcohol/week: 0.0 standard drinks of alcohol    Comment: occ   Drug use: No   Sexual activity: Not Currently  Other Topics Concern   Not on file  Social History Narrative   Not on file   Social Determinants of Health   Financial Resource Strain: Medium Risk (03/06/2021)   Overall Financial Resource Strain (CARDIA)    Difficulty of Paying Living Expenses: Somewhat hard  Food Insecurity: Food Insecurity Present (03/06/2021)   Hunger Vital Sign    Worried About Running Out of Food in the Last Year: Sometimes true    Ran Out of Food in the Last Year: Never true  Transportation Needs: No Transportation Needs (03/06/2021)   PRAPARE - Hydrologist (Medical): No    Lack of Transportation (Non-Medical): No  Physical Activity: Insufficiently Active (03/06/2021)   Exercise Vital Sign    Days of Exercise per Week: 4 days  Minutes of Exercise per Session: 20 min  Stress: Stress Concern Present (03/06/2021)   [place]    Feeling of Stress : Rather much  Social Connections: Moderately Isolated (03/06/2021)   Social Connection and Isolation Panel [NHANES]    Frequency of  Communication with Friends and Family: More than three times a week    Frequency of Social Gatherings with Friends and Family: Once a week    Attends Religious Services: Never    Marine scientist or Organizations: No    Attends Archivist Meetings: Never    Marital Status: Married  Human resources officer Violence: Not At Risk (03/06/2021)   Humiliation, Afraid, Rape, and Kick questionnaire    Fear of Current or Ex-Partner: No    Emotionally Abused: No    Physically Abused: No    Sexually Abused: No     BP 112/68   Pulse 71   Ht 6' (1.829 m)   Wt 192 lb (87.1 kg)   SpO2 94%   BMI 26.04 kg/m   Physical Exam:  Well appearing NAD HEENT: Unremarkable Neck:  No JVD, no thyromegally Lymphatics:  No adenopathy Back:  No CVA tenderness Lungs:  Clear with no wheezes HEART:  Regular rate rhythm, no murmurs, no rubs, no clicks Abd:  soft, positive bowel sounds, no organomegally, no rebound, no guarding Ext:  2 plus pulses, no edema, no cyanosis, no clubbing Skin:  No rashes no nodules Neuro:  CN II through XII intact, motor grossly intact  EKG - NSR with IVCD  DEVICE  Normal device function.  See PaceArt for details. [facility].  Assess/Plan: VT - he continues to have occaisional episodes of slow VT. He will continue his current meds. ICD - he has reached ERI and will undergo gen change in the coming weeks. Chronic systolic heart failure -his symptoms are class 2. No change in his meds. PAF - he is maintaining NSR. No change.  Steven Overlie Chrisa Hassan,MD

## 2022-03-13 NOTE — Telephone Encounter (Signed)
Patient appt today 12/15 at 345pm with Dr. Lovena Le

## 2022-03-13 NOTE — Patient Instructions (Addendum)
Medication Instructions:  Your physician recommends that you continue on your current medications as directed. Please refer to the Current Medication list given to you today.  *If you need a refill on your cardiac medications before your next appointment, please call your pharmacy*  Lab Work: See instruction letter for CBC and BMET lab draw orders.     Testing/Procedures: None ordered.  Follow-Up: See Instruction letter   Remote monitoring is used to monitor your ICD from home. This monitoring reduces the number of office visits required to check your device to one time per year. It allows [place] to keep an eye on the functioning of your device to ensure it is working properly. You are scheduled for a device check from home on 03/31/22. You may send your transmission at any time that day. If you have a wireless device, the transmission will be sent automatically. After your physician reviews your transmission, you will receive a postcard with your next transmission date.  Pacemaker Battery Change  A pacemaker battery usually lasts 5-15 years (6-7 years on average). A few times a year, you may be asked to visit your health care provider to have a full evaluation of your pacemaker. When the battery is low, your pacemaker will be completely replaced. Most often, this procedure is simpler than the first surgery because the wires (leads) that connect the pacemaker to the heart are already in place. There are many things that affect how long a pacemaker battery will last, including: The age of the pacemaker. The number of leads you have(1, 2, or 3). The use or workload of the pacemaker. If the pacemaker is helping the heart more often, the battery will not last as long. Power (voltage) settings. Tell a health care provider about: Any allergies you have. All medicines you are taking, including vitamins, herbs, eye drops, creams, and over-the-counter medicines. Any problems you or family members have  had with anesthetic medicines. Any blood disorders you have. Any surgeries you have had, especially any surgeries you have had since your last pacemaker was placed. Any medical conditions you have. Whether you are pregnant or may be pregnant. What are the risks? Generally, this is a safe procedure. However, problems may occur, including: Bleeding. Infection. Nerve damage. Allergic reaction to medicines. Damage to the leads that go to the heart. What happens before the procedure? Staying hydrated Follow instructions from your health care provider about hydration, which may include: Up to 2 hours before the procedure - you may continue to drink clear liquids, such as water, clear fruit juice, black coffee, and plain tea. Eating and drinking restrictions Follow instructions from your health care provider about eating and drinking restrictions, which may include: 8 hours before the procedure - stop eating heavy meals or foods, such as meat, fried foods, or fatty foods. 6 hours before the procedure - stop eating light meals or foods, such as toast or cereal. 6 hours before the procedure - stop drinking milk or drinks that contain milk. 2 hours before the procedure - stop drinking clear liquids. Medicines Ask your health care provider about: Changing or stopping your regular medicines. This is especially important if you are taking diabetes medicines or blood thinners. Taking medicines such as aspirin and ibuprofen. These medicines can thin your blood. Do not take these medicines unless your health care provider tells you to take them. Taking over-the-counter medicines, vitamins, herbs, and supplements. General instructions Ask your health care provider what steps will be taken to help prevent  infection. These may include: Removing hair at the surgery site. Washing skin with a germ-killing soap. Receiving antibiotic medicine. Plan to have someone take you home from the hospital or  clinic. If you will be going home right after the procedure, plan to have someone with you for 24 hours. What happens during the procedure? An IV will be inserted into one of your veins. You will be given one or more of the following: A medicine to help you relax (sedative). A medicine to numb the area where the pacemaker is located (local anesthetic). Your health care provider will make an incision to reopen the pocket holding the pacemaker. The old pacemaker will be disconnected from the leads. The leads will be tested. If needed, the leads will be replaced. If the leads are functioning properly, the new pacemaker will be connected to the existing leads. A heart monitor and a pacemaker programmer will be used to make sure that the newly implanted pacemaker is working properly. The incision site will be closed with stitches (sutures), adhesive strips, or skin glue. A bandage (dressing) will be placed over the pacemaker site. The procedure may vary among health care providers and hospitals. What happens after the procedure? Your blood pressure, heart rate, breathing rate, and blood oxygen level will be monitored until you leave the hospital or clinic. You may be given antibiotics. Your health care provider will tell you when your pacemaker will need to be tested again, or when to return to the office for removal of the dressing and sutures. If you were given a sedative during the procedure, it can affect you for several hours. Do not drive or operate machinery until your health care provider says that it is safe. You will be given a pacemaker identification card. This card lists the implant date, device model, and manufacturer of your pacemaker. Summary A pacemaker battery usually lasts 5-15 years (6-7 years on average). When the battery is low, your pacemaker will need to be replaced. Most often, this procedure is simpler than the first surgery because the wires (leads) that connect the  pacemaker to the heart are already in place. Risks of this procedure include bleeding, infection, and allergic reactions to medicines. This information is not intended to replace advice given to you by your health care provider. Make sure you discuss any questions you have with your health care provider. Document Revised: 02/16/2019 Document Reviewed: 02/16/2019 Elsevier Patient Education  [place].

## 2022-03-16 ENCOUNTER — Telehealth: Payer: Self-pay

## 2022-03-16 DIAGNOSIS — M545 Low back pain, unspecified: Secondary | ICD-10-CM

## 2022-03-16 NOTE — Telephone Encounter (Signed)
Pharmacy needs clarification on Gabapentin 300 mg and would like to know if 100 mg suppose to be send into pharmacy as well. Please advise

## 2022-03-18 ENCOUNTER — Telehealth: Payer: Self-pay | Admitting: Interventional Cardiology

## 2022-03-18 ENCOUNTER — Telehealth: Payer: Self-pay

## 2022-03-18 ENCOUNTER — Inpatient Hospital Stay (HOSPITAL_COMMUNITY)
Admission: EM | Admit: 2022-03-18 | Discharge: 2022-03-20 | DRG: 281 | Disposition: A | Discharge status: Home / Self Care | Payer: Medicare Other | Attending: Interventional Cardiology | Admitting: Interventional Cardiology

## 2022-03-18 ENCOUNTER — Encounter: Payer: Self-pay | Admitting: Internal Medicine

## 2022-03-18 ENCOUNTER — Emergency Department (HOSPITAL_COMMUNITY): Payer: Medicare Other

## 2022-03-18 ENCOUNTER — Other Ambulatory Visit: Payer: Self-pay

## 2022-03-18 DIAGNOSIS — K219 Gastro-esophageal reflux disease without esophagitis: Secondary | ICD-10-CM | POA: Diagnosis present

## 2022-03-18 DIAGNOSIS — I480 Paroxysmal atrial fibrillation: Secondary | ICD-10-CM | POA: Diagnosis present

## 2022-03-18 DIAGNOSIS — Z85828 Personal history of other malignant neoplasm of skin: Secondary | ICD-10-CM

## 2022-03-18 DIAGNOSIS — J449 Chronic obstructive pulmonary disease, unspecified: Secondary | ICD-10-CM | POA: Diagnosis present

## 2022-03-18 DIAGNOSIS — E785 Hyperlipidemia, unspecified: Secondary | ICD-10-CM | POA: Diagnosis present

## 2022-03-18 DIAGNOSIS — I214 Non-ST elevation (NSTEMI) myocardial infarction: Principal | ICD-10-CM | POA: Diagnosis present

## 2022-03-18 DIAGNOSIS — R519 Headache, unspecified: Secondary | ICD-10-CM | POA: Diagnosis present

## 2022-03-18 DIAGNOSIS — R7989 Other specified abnormal findings of blood chemistry: Secondary | ICD-10-CM

## 2022-03-18 DIAGNOSIS — I130 Hypertensive heart and chronic kidney disease with heart failure and stage 1 through stage 4 chronic kidney disease, or unspecified chronic kidney disease: Secondary | ICD-10-CM | POA: Diagnosis present

## 2022-03-18 DIAGNOSIS — Z8249 Family history of ischemic heart disease and other diseases of the circulatory system: Secondary | ICD-10-CM

## 2022-03-18 DIAGNOSIS — F419 Anxiety disorder, unspecified: Secondary | ICD-10-CM | POA: Diagnosis present

## 2022-03-18 DIAGNOSIS — I2510 Atherosclerotic heart disease of native coronary artery without angina pectoris: Secondary | ICD-10-CM | POA: Diagnosis not present

## 2022-03-18 DIAGNOSIS — R110 Nausea: Secondary | ICD-10-CM

## 2022-03-18 DIAGNOSIS — Z955 Presence of coronary angioplasty implant and graft: Secondary | ICD-10-CM | POA: Diagnosis not present

## 2022-03-18 DIAGNOSIS — Z882 Allergy status to sulfonamides status: Secondary | ICD-10-CM

## 2022-03-18 DIAGNOSIS — I252 Old myocardial infarction: Secondary | ICD-10-CM

## 2022-03-18 DIAGNOSIS — I083 Combined rheumatic disorders of mitral, aortic and tricuspid valves: Secondary | ICD-10-CM | POA: Diagnosis present

## 2022-03-18 DIAGNOSIS — N400 Enlarged prostate without lower urinary tract symptoms: Secondary | ICD-10-CM | POA: Diagnosis present

## 2022-03-18 DIAGNOSIS — I25118 Atherosclerotic heart disease of native coronary artery with other forms of angina pectoris: Secondary | ICD-10-CM | POA: Diagnosis present

## 2022-03-18 DIAGNOSIS — I10 Essential (primary) hypertension: Secondary | ICD-10-CM | POA: Diagnosis not present

## 2022-03-18 DIAGNOSIS — F32A Depression, unspecified: Secondary | ICD-10-CM | POA: Diagnosis present

## 2022-03-18 DIAGNOSIS — Z95810 Presence of automatic (implantable) cardiac defibrillator: Secondary | ICD-10-CM | POA: Diagnosis not present

## 2022-03-18 DIAGNOSIS — E782 Mixed hyperlipidemia: Secondary | ICD-10-CM | POA: Diagnosis not present

## 2022-03-18 DIAGNOSIS — R079 Chest pain, unspecified: Secondary | ICD-10-CM | POA: Diagnosis not present

## 2022-03-18 DIAGNOSIS — E1122 Type 2 diabetes mellitus with diabetic chronic kidney disease: Secondary | ICD-10-CM | POA: Diagnosis present

## 2022-03-18 DIAGNOSIS — Z7901 Long term (current) use of anticoagulants: Secondary | ICD-10-CM

## 2022-03-18 DIAGNOSIS — Z8616 Personal history of COVID-19: Secondary | ICD-10-CM

## 2022-03-18 DIAGNOSIS — I4891 Unspecified atrial fibrillation: Secondary | ICD-10-CM | POA: Diagnosis present

## 2022-03-18 DIAGNOSIS — I77810 Thoracic aortic ectasia: Secondary | ICD-10-CM | POA: Diagnosis present

## 2022-03-18 DIAGNOSIS — N182 Chronic kidney disease, stage 2 (mild): Secondary | ICD-10-CM | POA: Diagnosis present

## 2022-03-18 DIAGNOSIS — I5022 Chronic systolic (congestive) heart failure: Secondary | ICD-10-CM | POA: Diagnosis present

## 2022-03-18 DIAGNOSIS — R0789 Other chest pain: Principal | ICD-10-CM

## 2022-03-18 DIAGNOSIS — I472 Ventricular tachycardia, unspecified: Secondary | ICD-10-CM | POA: Diagnosis present

## 2022-03-18 DIAGNOSIS — R600 Localized edema: Secondary | ICD-10-CM

## 2022-03-18 DIAGNOSIS — Z8674 Personal history of sudden cardiac arrest: Secondary | ICD-10-CM

## 2022-03-18 DIAGNOSIS — Z79899 Other long term (current) drug therapy: Secondary | ICD-10-CM

## 2022-03-18 DIAGNOSIS — Z950 Presence of cardiac pacemaker: Secondary | ICD-10-CM | POA: Diagnosis not present

## 2022-03-18 DIAGNOSIS — I5020 Unspecified systolic (congestive) heart failure: Secondary | ICD-10-CM | POA: Diagnosis not present

## 2022-03-18 LAB — COMPREHENSIVE METABOLIC PANEL
ALT: 18 U/L (ref 0–44)
AST: 24 U/L (ref 15–41)
Albumin: 3.3 g/dL — ABNORMAL LOW (ref 3.5–5.0)
Alkaline Phosphatase: 51 U/L (ref 38–126)
Anion gap: 8 (ref 5–15)
BUN: 11 mg/dL (ref 8–23)
CO2: 25 mmol/L (ref 22–32)
Calcium: 9.2 mg/dL (ref 8.9–10.3)
Chloride: 105 mmol/L (ref 98–111)
Creatinine, Ser: 0.88 mg/dL (ref 0.61–1.24)
GFR, Estimated: >60 mL/min (ref >60)
Glucose, Bld: 104 mg/dL — ABNORMAL HIGH (ref 70–99)
Potassium: 3.8 mmol/L (ref 3.5–5.1)
Sodium: 138 mmol/L (ref 135–145)
Total Bilirubin: 0.6 mg/dL (ref 0.3–1.2)
Total Protein: 6.2 g/dL — ABNORMAL LOW (ref 6.5–8.1)

## 2022-03-18 LAB — CBC WITH DIFFERENTIAL/PLATELET
Abs Immature Granulocytes: 0.09 10*3/uL — ABNORMAL HIGH (ref 0.00–0.07)
Basophils Absolute: 0 10*3/uL (ref 0.0–0.1)
Basophils Relative: 1 %
Eosinophils Absolute: 0.1 10*3/uL (ref 0.0–0.5)
Eosinophils Relative: 2 %
HCT: 33.4 % — ABNORMAL LOW (ref 39.0–52.0)
Hemoglobin: 10.9 g/dL — ABNORMAL LOW (ref 13.0–17.0)
Immature Granulocytes: 3 %
Lymphocytes Relative: 28 %
Lymphs Abs: 0.9 10*3/uL (ref 0.7–4.0)
MCH: 35.2 pg — ABNORMAL HIGH (ref 26.0–34.0)
MCHC: 32.6 g/dL (ref 30.0–36.0)
MCV: 107.7 fL — ABNORMAL HIGH (ref 80.0–100.0)
Monocytes Absolute: 0.4 10*3/uL (ref 0.1–1.0)
Monocytes Relative: 13 %
Neutro Abs: 1.6 10*3/uL — ABNORMAL LOW (ref 1.7–7.7)
Neutrophils Relative %: 53 %
Platelets: 186 10*3/uL (ref 150–400)
RBC: 3.1 MIL/uL — ABNORMAL LOW (ref 4.22–5.81)
RDW: 17.8 % — ABNORMAL HIGH (ref 11.5–15.5)
WBC: 3 10*3/uL — ABNORMAL LOW (ref 4.0–10.5)
nRBC: 0 % (ref 0.0–0.2)

## 2022-03-18 LAB — TROPONIN I (HIGH SENSITIVITY)
Troponin I (High Sensitivity): 69 ng/L — ABNORMAL HIGH (ref <18)
Troponin I (High Sensitivity): 497 ng/L (ref <18)

## 2022-03-18 LAB — APTT: aPTT: 36 seconds (ref 24–36)

## 2022-03-18 LAB — HEPARIN LEVEL (UNFRACTIONATED): Heparin Unfractionated: 0.69 IU/mL (ref 0.30–0.70)

## 2022-03-18 LAB — BRAIN NATRIURETIC PEPTIDE: B Natriuretic Peptide: 90.1 pg/mL (ref 0.0–100.0)

## 2022-03-18 MED ORDER — GABAPENTIN 300 MG PO CAPS: 300.0000 mg | ORAL_CAPSULE | Freq: Two times a day (BID) | ORAL | 1 refills | Status: DC

## 2022-03-18 MED ORDER — HEPARIN (PORCINE) 25000 UT/250ML-% IV SOLN
1750.0000 [IU]/h | INTRAVENOUS | Status: DC
  Administered 2022-03-18: 1200 [IU]/h via INTRAVENOUS
  Administered 2022-03-19: 1600 [IU]/h via INTRAVENOUS
  Filled 2022-03-18 (×2): qty 250

## 2022-03-18 MED ORDER — LIDOCAINE VISCOUS HCL 2 % MT SOLN
15.0000 mL | Freq: Once | OROMUCOSAL | Status: AC
  Administered 2022-03-18: 15 mL via ORAL
  Filled 2022-03-18: qty 15

## 2022-03-18 MED ORDER — CARVEDILOL 3.125 MG PO TABS
3.1250 mg | ORAL_TABLET | Freq: Two times a day (BID) | ORAL | Status: DC
  Administered 2022-03-18 – 2022-03-20 (×5): 3.125 mg via ORAL
  Filled 2022-03-18 (×5): qty 1

## 2022-03-18 MED ORDER — ACETAMINOPHEN 325 MG PO TABS
650.0000 mg | ORAL_TABLET | ORAL | Status: DC | PRN
  Administered 2022-03-18 – 2022-03-19 (×2): 650 mg via ORAL
  Filled 2022-03-18 (×2): qty 2

## 2022-03-18 MED ORDER — NITROGLYCERIN IN D5W 200-5 MCG/ML-% IV SOLN
0.0000 ug/min | INTRAVENOUS | Status: DC
  Administered 2022-03-18: 5 ug/min via INTRAVENOUS
  Filled 2022-03-18: qty 250

## 2022-03-18 MED ORDER — MEXILETINE HCL 200 MG PO CAPS
200.0000 mg | ORAL_CAPSULE | Freq: Two times a day (BID) | ORAL | Status: DC
  Administered 2022-03-18 – 2022-03-20 (×5): 200 mg via ORAL
  Filled 2022-03-18 (×6): qty 1

## 2022-03-18 MED ORDER — ASPIRIN 300 MG RE SUPP: 300.0000 mg | RECTAL | Status: AC

## 2022-03-18 MED ORDER — ASPIRIN 81 MG PO CHEW
324.0000 mg | CHEWABLE_TABLET | ORAL | Status: AC
  Administered 2022-03-18: 324 mg via ORAL
  Filled 2022-03-18: qty 4

## 2022-03-18 MED ORDER — ASPIRIN 81 MG PO TBEC
81.0000 mg | DELAYED_RELEASE_TABLET | Freq: Every day | ORAL | Status: DC
  Administered 2022-03-19 – 2022-03-20 (×2): 81 mg via ORAL
  Filled 2022-03-18 (×2): qty 1

## 2022-03-18 MED ORDER — ONDANSETRON HCL 4 MG/2ML IJ SOLN: 4.0000 mg | Freq: Four times a day (QID) | INTRAMUSCULAR | Status: DC | PRN

## 2022-03-18 MED ORDER — ROSUVASTATIN CALCIUM 20 MG PO TABS
40.0000 mg | ORAL_TABLET | Freq: Every day | ORAL | Status: DC
  Administered 2022-03-18 – 2022-03-20 (×3): 40 mg via ORAL
  Filled 2022-03-18 (×3): qty 2

## 2022-03-18 MED ORDER — NITROGLYCERIN 0.4 MG SL SUBL
0.4000 mg | SUBLINGUAL_TABLET | SUBLINGUAL | Status: DC | PRN
  Administered 2022-03-18: 0.4 mg via SUBLINGUAL
  Filled 2022-03-18: qty 1

## 2022-03-18 MED ORDER — AMIODARONE HCL 200 MG PO TABS
200.0000 mg | ORAL_TABLET | Freq: Every day | ORAL | Status: DC
  Administered 2022-03-18 – 2022-03-20 (×3): 200 mg via ORAL
  Filled 2022-03-18 (×3): qty 1

## 2022-03-18 MED ORDER — ALUM & MAG HYDROXIDE-SIMETH 200-200-20 MG/5ML PO SUSP
30.0000 mL | Freq: Once | ORAL | Status: AC
  Administered 2022-03-18: 30 mL via ORAL
  Filled 2022-03-18: qty 30

## 2022-03-18 NOTE — ED Provider Notes (Signed)
[facility] EMERGENCY DEPARTMENT Provider Note  CSN: [id number] Arrival date & time: 03/18/22 9562  Chief Complaint(s) Chest Pain  HPI Steven Guzman is a 81 y.o. male with extensive past medical history listed below including hypertension, prior MI status post stenting x 2, chronic systolic heart failure with improved EF to 45%, history of V. tach status post ablation requiring ICD.  Patient presents for 2 hours of substernal chest pressure radiating to the neck.  Associated shortness of breath and nausea.  No emesis.  Patient reported recent COVID infection several weeks ago.  No coughing or congestion.  No abdominal pain.  Patient took 2 nitroglycerin at home without any relief prompting a call to EMS.  They gave patient aspirin and another dose of nitroglycerin.  Minimal improvement in route.  Patient reports that he had similar chest pain several days ago, and discussed it with cardiologist in clinic.  The history is provided by the patient.    Past Medical History Past Medical History:  Diagnosis Date   AICD (automatic cardioverter/defibrillator) present    Aneurysm ([place])    a. Aneurysmal infrarenal aorta up to 33 mm on CT 10/2014, recommended f/u due 10/2017   Anginal pain ([place])    Anxiety    Basal cell carcinoma of nose    S/P MOHS   Biliary acute pancreatitis    CAD (coronary artery disease)    a. s/p MI in 1994 with PCI to LAD at that time b. cath 10/2012 demonstrated EF 30%, inferior akinesis with mild hypokinesis of all walls, patent LAD and RCA stents; ostial PDA with 80-90% obstruction with medical therapy recommended    Chronic systolic CHF (congestive heart failure) (HCC)    EF 30 to 35 % as of 09/2014.    CKD (chronic kidney disease), stage III ([place])    Complication of anesthesia 10/2014   "had to have defibrillator w/ERCP"   COPD (chronic obstructive pulmonary disease) ([place])    a. followed by pulmonary, COPD GOLD stage II   Depression     Diverticulosis of colon 07/2014   noted on CT   GERD (gastroesophageal reflux disease)    Hiatal hernia    Hyperglycemia 10/2012.   Hyperlipidemia    Hypertension    Myocardial infarction [facility]) 1994; 2011   Pneumonia 1946; 2015   Prostate enlargement 07/2014   observed on CT   Tobacco abuse    Ventricular tachycardia ([place])    a. 08/2009 s/p BSX E110 Teligen 100 AICD, ser#: [id number];  b. 08/2008 VT req ATP - detection reprogrammed from 160 to 150. c. EPS and VT ablation by Dr. Lovena Le 12/21/2014   Patient Active Problem List   Diagnosis Date Noted   Major depressive disorder with single episode, in partial remission ([place]) 03/05/2020   Paraseptal emphysema ([place]) 12/15/2018   Tobacco abuse 12/15/2018   Perianal abscess 11/15/2018   Left inguinal hernia 11/15/2018   Leg wound, right 11/15/2018   Weight loss 09/29/2018   Dark stools 09/29/2018   Lumbar trigger point syndrome 04/20/2018   Throat and mouth symptom 02/03/2018   Gastroesophageal reflux disease without esophagitis    Esophageal stricture    Gastritis and gastroduodenitis    Degenerative cervical spinal stenosis 10/27/2017   Carotid bruit 10/27/2017   B12 deficiency 08/25/2017   Anemia 06/10/2017   Right ankle pain 04/09/2017   BPH (benign prostatic hyperplasia) 12/31/2016   Dizzinesses 05/22/2016   Mass of throat 06/30/2015   Depression 05/24/2015   Atrial  fibrillation ([place]) 03/15/2015   Routine general medical examination at a health care facility 05/12/2014   Hemoptysis 03/16/2014   COPD GOLD GRADE C 74/25/9563   Chronic systolic heart failure ([place]) 07/07/2013   CAD (coronary artery disease) 11/23/2012   Smokers' cough ([place]) 11/23/2012   Ventricular tachycardia ([place]) 11/23/2012   Essential hypertension 11/23/2012   Hyperlipidemia 11/23/2012   ICD (implantable cardioverter-defibrillator) in place 11/23/2012   Home Medication(s) Prior to Admission medications   Medication Sig Start Date End Date Taking? Authorizing  Provider  albuterol (PROVENTIL) (2.5 MG/3ML) 0.083% nebulizer solution USE 1 VIAL VIA NEBULIZER EVERY 6 HOURS AS NEEDED FOR WHEEZING OR SHORTNESS OF BREATH 07/18/19   Laurin Coder, MD  amiodarone (PACERONE) 200 MG tablet TAKE ONE TABLET BY MOUTH DAILY 07/22/21   Belva Crome, MD  apixaban (ELIQUIS) 5 MG TABS tablet Take 1 tablet (5 mg total) by mouth 2 (two) times daily. 10/10/21   Belva Crome, MD  Artificial Tear Solution (SOOTHE XP OP) Place 2 drops into both eyes daily as needed (dry eyes).    [provider]  ascorbic acid (VITAMIN C) 500 MG tablet Take 1 tablet by mouth daily. 02/10/22   [provider]  benazepril (LOTENSIN) 10 MG tablet Take 0.5 tablets (5 mg total) by mouth daily. 01/07/22   Belva Crome, MD  buPROPion (WELLBUTRIN) 75 MG tablet TAKE ONE TABLET BY MOUTH DAILY 09/10/21   Fargo, Amy E, NP  busPIRone (BUSPAR) 15 MG tablet Take 15 mg by mouth 3 (three) times daily. Patient taking 10 mg by mouth twice daily    [provider]  calcium citrate (CALCITRATE - DOSED IN MG ELEMENTAL CALCIUM) 950 (200 Ca) MG tablet Take by mouth. 02/10/22   [provider]  carvedilol (COREG) 3.125 MG tablet Take 1 tablet (3.125 mg total) by mouth 2 (two) times daily. 03/11/22   Belva Crome, MD  cetirizine (ZYRTEC) 10 MG tablet Take 1 tablet by mouth daily. 12/16/17   [provider]  Cyanocobalamin (VITAMIN B-12) 5000 MCG SUBL Place under the tongue daily.    [provider]  escitalopram (LEXAPRO) 20 MG tablet Take 20 mg by mouth daily. Take 1/2 tablet (10 mg total ) by mouth daily    [provider]  fluticasone (FLONASE) 50 MCG/ACT nasal spray Place 2 sprays into both nostrils daily. 02/13/21   Fargo, Amy E, NP  furosemide (LASIX) 40 MG tablet Take 0.5 tablets (20 mg total) by mouth daily. 11/13/21   Fargo, Amy E, NP  gabapentin (NEURONTIN) 300 MG capsule Take 1 capsule (300 mg total) by mouth 2 (two) times daily. Pt take 1  tablet 300 mg at night and 100 mg in the morning. 03/12/22   Fargo, Amy E, NP  guaiFENesin (MUCINEX) 600 MG 12 hr tablet Take 1 tablet by mouth 2 (two) times daily.    [provider]  Ipratropium-Albuterol (COMBIVENT RESPIMAT) 20-100 MCG/ACT AERS respimat Inhale 1 puff into the lungs every 6 (six) hours. Shortness of breath or wheezing 09/18/21   [name] E, NP  IRON PO Take by mouth daily.     [provider]  MAGNESIUM-OXIDE 400 (241.3 Mg) MG tablet TAKE 1 TABSULE BY MOUTH DAILY 11/24/18   Hoyt Koch, MD  mexiletine (MEXITIL) 200 MG capsule Take 1 capsule (200 mg total) by mouth 2 (two) times daily. 10/10/21   Belva Crome, MD  Multiple Vitamins-Minerals (CENTRUM ADULTS PO) Take by mouth daily.  [provider]  mupirocin ointment (BACTROBAN) 2 % Apply 1 application. topically 2 (two) times daily. Apply to lower leg wounds daily prn 07/17/21   Fargo, Amy E, NP  nitroGLYCERIN (NITROSTAT) 0.4 MG SL tablet Place 1 tablet (0.4 mg total) under the tongue every 5 (five) minutes as needed for chest pain. DISSOLVE 1 TABLET UNDER THE TONGUE EVERY 5 MINUTES FOR 3 DOSES 05/08/21   Belva Crome, MD  omeprazole (PRILOSEC) 20 MG capsule Take 20 mg by mouth daily.     [provider]  polyethylene glycol powder (GLYCOLAX/MIRALAX) 17 GM/SCOOP powder Take 1 capful in 8 ounces of fluid each morning 03/08/22   Luster Landsberg, MD  potassium chloride SA (KLOR-CON M) 20 MEQ tablet Take 20 mEq by mouth once.    [provider]  Respiratory Therapy Supplies (FLUTTER) DEVI Use as directed 03/16/17   Juanito Doom, MD  rosuvastatin (CRESTOR) 40 MG tablet Take 1 tablet (40 mg total) by mouth daily. 06/05/21   [name], MD  Sennosides-Docusate Sodium (STOOL SOFTENER/LAXATIVE PO) Take 2 tablets by mouth daily.    [provider]  Spacer/Aero-Holding Chambers (OPTICHAMBER DIAMOND) [place] optichamber [facility] 09/12/19   Olalere, Ernesto Rutherford, MD  STIOLTO RESPIMAT  2.5-2.5 MCG/ACT AERS Inhale 2 puffs into the lungs daily. 09/22/21   [provider]  tamsulosin (FLOMAX) 0.4 MG CAPS capsule TAKE ONE CAPSULE BY MOUTH DAILY AFTER SUPPER 06/30/21   Fargo, Amy E, NP                                                                                                                                    Allergies Sulfa antibiotics  Review of Systems Review of Systems As noted in HPI  Physical Exam Vital Signs  I have reviewed the triage vital signs BP 135/78   Pulse 68   Temp (!) 96.7 F (35.9 C) (Temporal)   Resp 20   Wt 87.1 kg   SpO2 96%   BMI 26.04 kg/m   Physical Exam Vitals reviewed.  Constitutional:      General: He is not in acute distress.    Appearance: He is well-developed. He is not diaphoretic.  HENT:     Head: Normocephalic and atraumatic.     Nose: Nose normal.  Eyes:     General: No scleral icterus.       Right eye: No discharge.        Left eye: No discharge.     Conjunctiva/sclera: Conjunctivae normal.     Pupils: Pupils are equal, round, and reactive to light.  Cardiovascular:     Rate and Rhythm: Normal rate and regular rhythm.     Heart sounds: No murmur heard.    No friction rub. No gallop.  Pulmonary:     Effort: Pulmonary effort is normal. No respiratory distress.     Breath sounds: Normal breath sounds. No stridor. No rales.  Chest:    Abdominal:     General: There is no distension.     Palpations: Abdomen is soft.     Tenderness: There is no abdominal tenderness.  Musculoskeletal:        General: No tenderness.     Cervical back: Normal range of motion and neck supple.     Right lower leg: 1+ Pitting Edema present.  Skin:    General: Skin is warm and dry.     Findings: No erythema or rash.  Neurological:     Mental Status: He is alert and oriented to person, place, and time.     ED Results and Treatments Labs (all labs ordered are listed, but only abnormal results are displayed) Labs Reviewed   CBC WITH DIFFERENTIAL/PLATELET - Abnormal; Notable for the following components:      Result Value   WBC 3.0 (*)    RBC 3.10 (*)    Hemoglobin 10.9 (*)    HCT 33.4 (*)    MCV 107.7 (*)    MCH 35.2 (*)    RDW 17.8 (*)    Neutro Abs 1.6 (*)    Abs Immature Granulocytes 0.09 (*)    All other components within normal limits  COMPREHENSIVE METABOLIC PANEL - Abnormal; Notable for the following components:   Glucose, Bld 104 (*)    Total Protein 6.2 (*)    Albumin 3.3 (*)    All other components within normal limits  TROPONIN I (HIGH SENSITIVITY) - Abnormal; Notable for the following components:   Troponin I (High Sensitivity) 69 (*)    All other components within normal limits  BRAIN NATRIURETIC PEPTIDE  TROPONIN I (HIGH SENSITIVITY)                                                                                                                         EKG  EKG Interpretation  Date/Time:  Wednesday March 18 2022 04:53:16 EST Ventricular Rate:  68 PR Interval:  172 QRS Duration: 170 QT Interval:  499 QTC Calculation: 531 R Axis:   97 Text Interpretation: Sinus rhythm Nonspecific intraventricular conduction delay Probable inferior infarct, recent similar to last tracing Reconfirmed by Addison Lank [phone]) on 03/18/2022 4:57:14 AM       Radiology DG Chest 2 View  Result Date: 03/18/2022 CLINICAL DATA:  Chest pain EXAM: CHEST - 2 VIEW COMPARISON:  02/25/2022 FINDINGS: Normal heart size and stable aortic tortuosity. Sizable hiatal hernia. Dual-chamber ICD leads from the left. There is no edema, consolidation, effusion, or pneumothorax. Artifact from EKG leads. IMPRESSION: Stable exam.  No acute finding. Electronically Signed   By: Jorje Guild M.D.   On: 03/18/2022 05:45    Medications Ordered in ED Medications  alum & mag hydroxide-simeth (MAALOX/MYLANTA) 200-200-20 MG/5ML suspension 30 mL (30 mLs Oral Given 03/18/22 0517)    And  lidocaine (XYLOCAINE) 2 % viscous mouth  solution 15 mL (15 mLs Oral Given 03/18/22 0517)  Procedures .1-3 Lead EKG Interpretation  Performed by: Fatima Blank, MD Authorized by: Fatima Blank, MD     Interpretation: normal     ECG rate:  69   ECG rate assessment: normal     Rhythm: sinus rhythm     Ectopy: none     Conduction: normal   .Critical Care  Performed by: Fatima Blank, MD Authorized by: Fatima Blank, MD   Critical care provider statement:    Critical care time (minutes):  45   Critical care time was exclusive of:  Separately billable procedures and treating other patients   Critical care was time spent personally by me on the following activities:  Development of treatment plan with patient or surrogate, discussions with consultants, evaluation of patient's response to treatment, examination of patient, obtaining history from patient or surrogate, review of old charts, re-evaluation of patient's condition, pulse oximetry, ordering and review of radiographic studies, ordering and review of laboratory studies and ordering and performing treatments and interventions   (including critical care time)  Medical Decision Making / ED Course   Medical Decision Making Amount and/or Complexity of Data Reviewed External Data Reviewed: radiology.    Details: ECHO Jan 2023 with improved EF at 45% Labs: ordered. Decision-making details documented in ED Course. Radiology: ordered and independent interpretation performed. Decision-making details documented in ED Course. ECG/medicine tests: ordered and independent interpretation performed. Decision-making details documented in ED Course.  Risk OTC drugs. Prescription drug management. Decision regarding hospitalization.    Substernal chest pain. Will need to assess for evidence of ACS.  Also considering GI  etiology.  Will need to rule out pneumonia, pneumothorax, heart failure.  Lower concern for pulmonary embolism.  Not classic for aortic dissection or esophageal perforation.  EKG notable for mild hyperacute T wave changes in the inferior lateral leads similar to tracing from 1 month ago. CBC without leukocytosis.  Stable anemia. Metabolic panel without significant electrolyte derangements or renal insufficiency. Initial troponin slightly elevated at 69.  3 weeks ago patient's troponin was 8. BNP within normal limits.  Chest x-ray without evidence of pneumonia, pneumothorax, pulmonary edema or pleural effusions.  Patient provided with GI cocktail which resulted in complete resolution of his pain.  This favors possible GI etiology however given the elevated troponin, I consulted cardiology and spoke with Dr. Irish Lack who agreed with obtaining a delta troponin.  They will evaluate patient in the emergency department and determine disposition.  Patient care turned over to oncoming provider. Patient case and results discussed in detail; please see their note for further ED managment.         Final Clinical Impression(s) / ED Diagnoses Final diagnoses:  Atypical chest pain  Elevated troponin           This chart was dictated using voice recognition software.  Despite best efforts to proofread,  errors can occur which can change the documentation meaning.    Fatima Blank, MD 03/18/22 [phone]

## 2022-03-18 NOTE — Telephone Encounter (Signed)
Thank you  for update

## 2022-03-18 NOTE — Telephone Encounter (Signed)
Message sent to scheduling to verify appointment with Dr. Lovena Le on 04/03/22 at 415 pm, not seen on pt schedule?  Follow up required.

## 2022-03-18 NOTE — ED Notes (Signed)
Pt c/o generalized chest pressure "Like someone is standing on my chest." MD notificed. EKG obtained.

## 2022-03-18 NOTE — ED Notes (Signed)
[employer] interrogation performed - awaiting report to be faxed. Contact rep at [phone] for further concerns

## 2022-03-18 NOTE — ED Provider Notes (Signed)
  Physical Exam  BP 135/78   Pulse 68   Temp (!) 96.7 F (35.9 C) (Temporal)   Resp 20   Wt 87.1 kg   SpO2 96%   BMI 26.04 kg/m     Procedures  .Critical Care  Performed by: Regan Lemming, MD Authorized by: Regan Lemming, MD   Critical care provider statement:    Critical care time (minutes):  30   Critical care was time spent personally by me on the following activities:  Development of treatment plan with patient or surrogate, discussions with consultants, evaluation of patient's response to treatment, examination of patient, ordering and review of laboratory studies, ordering and review of radiographic studies, ordering and performing treatments and interventions, pulse oximetry, re-evaluation of patient's condition and review of old charts   Care discussed with: admitting provider     ED Course / MDM   Clinical Course as of 03/18/22 0926  Wed Mar 18, 2022  0926 Troponin I (High Sensitivity)(!!): 497 [JL]    Clinical Course User Index [JL] Regan Lemming, MD   Medical Decision Making Amount and/or Complexity of Data Reviewed Labs: ordered. Radiology: ordered.  Risk OTC drugs. Prescription drug management. Decision regarding hospitalization.   57M, hx of vtach and CAD with [place] who presents with CP. EKG appears similar to prior. Trop elevated. Cards to see. Feels improved after a GI cocktail, currently CP free.   Repeat troponin 497. Pt is on Eliquis. On repeat evaluation, the patient does admit to experiencing some ongoing chest pressure. Engaged cardiology who plans to admit. Pt is status post ASA. Dr. Lilia Argue will admit, pt repeat EKG without STEMI, pain improving at time of admission, plan to admit for CP workup with concern for NSTEMI. Stable at time of admission.       Regan Lemming, MD 03/18/22 [phone]

## 2022-03-18 NOTE — Telephone Encounter (Signed)
Will send this information to Dr. Tamala Julian and covering RN as a general FYI from the pts wife.  Looks like Dr. Tamala Julian is scheduled in the cath lab working today.

## 2022-03-18 NOTE — Telephone Encounter (Signed)
Patient has had multiple missed visits, including today. Patients wife advised the home health agency that they will need to come out next week and did not elaborate on the reason for canceling.   FYI

## 2022-03-18 NOTE — Progress Notes (Signed)
ANTICOAGULATION CONSULT NOTE  Pharmacy Consult for heparin Indication: chest pain/ACS / AFib  Allergies  Allergen Reactions   Sulfa Antibiotics Hives    Patient Measurements: Weight: 87.1 kg (192 lb) Heparin Dosing Weight: 87kg  Vital Signs: Temp: 96.7 F (35.9 C) (12/20 0443) Temp Source: Temporal (12/20 0443) BP: 147/88 (12/20 0835) Pulse Rate: 73 (12/20 0850)  Labs: Recent Labs    03/18/22 0438 03/18/22 0708  HGB 10.9*  --   HCT 33.4*  --   PLT 186  --   CREATININE 0.88  --   TROPONINIHS 69* 497*    Estimated Creatinine Clearance: 69.8 mL/min (by C-G formula based on SCr of 0.88 mg/dL).   Medical History: Past Medical History:  Diagnosis Date   AICD (automatic cardioverter/defibrillator) present    Aneurysm ([place])    a. Aneurysmal infrarenal aorta up to 33 mm on CT 10/2014, recommended f/u due 10/2017   Anginal pain ([place])    Anxiety    Basal cell carcinoma of nose    S/P MOHS   Biliary acute pancreatitis    CAD (coronary artery disease)    a. s/p MI in 1994 with PCI to LAD at that time b. cath 10/2012 demonstrated EF 30%, inferior akinesis with mild hypokinesis of all walls, patent LAD and RCA stents; ostial PDA with 80-90% obstruction with medical therapy recommended    Chronic systolic CHF (congestive heart failure) (HCC)    EF 30 to 35 % as of 09/2014.    CKD (chronic kidney disease), stage III ([place])    Complication of anesthesia 10/2014   "had to have defibrillator w/ERCP"   COPD (chronic obstructive pulmonary disease) ([place])    a. followed by pulmonary, COPD GOLD stage II   Depression    Diverticulosis of colon 07/2014   noted on CT   GERD (gastroesophageal reflux disease)    Hiatal hernia    Hyperglycemia 10/2012.   Hyperlipidemia    Hypertension    Myocardial infarction [facility]) 1994; 2011   Pneumonia 1946; 2015   Prostate enlargement 07/2014   observed on CT   Tobacco abuse    Ventricular tachycardia ([place])    a. 08/2009 s/p BSX E110 Teligen 100 AICD,  ser#: [id number];  b. 08/2008 VT req ATP - detection reprogrammed from 160 to 150. c. EPS and VT ablation by Dr. Lovena Le 12/21/2014     Assessment: 38 yoM admitted with ACS. Pt with hx CAD and AFib on apixaban PTA. Per pt last dose was yesterday 2330, pharmacy to dose IV heparin. CBC ok, trops up.   Goal of Therapy:  Heparin level 0.3-0.7 units/ml aPTT 66-102 seconds Monitor platelets by anticoagulation protocol: Yes   Plan:  -Heparin 1200 units/h no bolus today 1130 -Check aPTT in 8h -Daily aPTT, heparin level, CBC  Arrie Senate, PharmD, BCPS, [facility] Clinical Pharmacist [phone] Please check AMION for all [place] numbers 03/18/2022

## 2022-03-18 NOTE — Telephone Encounter (Signed)
Fargo, Amy E, NP  You8 hours ago (8:39 AM)    Please send gabapentin 300 mg as prescribed. He has left over 100 mg capsules from previous use.    Medication was send into pharmacy per Amy Fargo,NP message above.

## 2022-03-18 NOTE — ED Triage Notes (Addendum)
Pt in from home with c/o sharp central cp, onset 1.5hrs ago. States it began shortly after he woke up, endorses some sob and radiation of pain to neck. Pt took 3 NTG PTA with no change, arrives 6/10 pain. Hx of 2 MI's and recent R hip arthoplasty 11/1 - on Eliquis. Has [employer] pacemaker/defib

## 2022-03-18 NOTE — Addendum Note (Signed)
Addended by: Dan Maker on: 03/18/2022 04:43 PM   Modules accepted: Orders

## 2022-03-18 NOTE — ED Notes (Signed)
RN gave pt urinal. Pt is in no apparent distress at this time.

## 2022-03-18 NOTE — Telephone Encounter (Signed)
Wife called to let Dr. Tamala Julian know that the patient is now in the ER and will need to get an emergency cath.

## 2022-03-18 NOTE — H&P (Addendum)
Cardiology H&P   Patient ID: Steven Guzman MRN: [id number]; DOB: [birth year]  Admit date: 03/18/2022 Date of Consult: 03/18/2022  PCP:  Yvonna Alanis, NP   [place] Providers Cardiologist:  Sanda Klein, MD  Electrophysiologist:  Cristopher Peru, MD    Patient Profile:   Steven Guzman is a 81 y.o. male with a hx of CAD status post inferior MI/cardiac arrest '95, MI treated with PCI of LAD/RCA '11, ICM s/p AICD implant '10, VT s/p ablation '16, paroxysmal atrial fibrillation on Eliquis, AAA, hypertension, hyperlipidemia, CKD stage III who is being seen 03/18/2022 for the evaluation of chest pain at the request of Dr. Leonette Monarch.  History of Present Illness:   Steven Guzman is an 81 year old male with past medical history noted above.  He has been followed by Dr. Tamala Julian as well as Dr. Lovena Le.  Last cardiac catheterization 10/2012 showing patent LAD and RCA stent with 80 to 90% PDA, 50% proximal circumflex after first OM medically treated.  Echocardiogram 03/2021 showed LVEF of 40 to 45%, mildly decreased LV function, no regional wall motion abnormality, inferior/lateral akinesis, anterior lateral hypokinesis with mildly dilated LV, grade 1 diastolic dysfunction with normal RV size and function, moderately dilated LA, mildly dilated RA.  Surveillance CT chest for AAA 04/2021 showing 4.3 cm dilation. Last seen in the office with Jaquelyn Bitter on 11/2021.  At this visit he reported experiencing right shoulder and back pain which she reports would wake him from sleep.  Denied any chest pain or angina with activities.  Reported compliance with his home medications.  Continued to smoke.  Underwent right hemiarthroplasty 11/11 and underwent rehab at [facility].  Most recently seen in the office with Dr. Lovena Le on 03/13/2022 and noted no ICD shocks.  No syncope.  He was continued on amiodarone 200 mg daily, Eliquis 5 mg twice daily, Coreg 3.125 mg twice daily, mexiletine 200 mg twice daily.  His ICD had  reached ERI with plans to undergo a GEN change in the coming weeks.  Interrogation reported occasional episodes of slow VT.  He was also noted to be maintaining sinus rhythm.  Presented to the ED 12/20 with complaints of chest pain that began shortly after he woke up.  States he does not feel that the chest pain woke him from sleep.  Episode radiated across his chest.  He took 2 sublingual nitroglycerin without significant relief.  Labs in the ED showed sodium 138, potassium 3.8, creatinine 0.8, BNP 90, high-sensitivity troponin 69, WBC 3, hemoglobin 10.9.  Chest x-ray with no acute finding.  EKG shows sinus rhythm, 68 bpm, prolonged QT, IVCD.  Patient was given GI cocktail in the ED with some improvement of symptoms but still with ongoing chest pressure.  In talking with patient reports he was running errands yesterday including going to the post office and grocery shopping.  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  Had no anginal symptoms during those activities during the day.  Past Medical History:  Diagnosis Date   AICD (automatic cardioverter/defibrillator) present    Aneurysm ([place])    a. Aneurysmal infrarenal aorta up to 33 mm on CT 10/2014, recommended f/u due 10/2017   Anginal pain ([place])    Anxiety    Basal cell carcinoma of nose    S/P MOHS   Biliary acute pancreatitis    CAD (coronary artery disease)    a. s/p MI in 1994 with PCI to LAD at that time b. cath 10/2012  demonstrated EF 30%, inferior akinesis with mild hypokinesis of all walls, patent LAD and RCA stents; ostial PDA with 80-90% obstruction with medical therapy recommended    Chronic systolic CHF (congestive heart failure) (HCC)    EF 30 to 35 % as of 09/2014.    CKD (chronic kidney disease), stage III ([place])    Complication of anesthesia 10/2014   "had to have defibrillator w/ERCP"   COPD (chronic obstructive pulmonary disease) ([place])    a. followed by pulmonary, COPD GOLD stage II   Depression     Diverticulosis of colon 07/2014   noted on CT   GERD (gastroesophageal reflux disease)    Hiatal hernia    Hyperglycemia 10/2012.   Hyperlipidemia    Hypertension    Myocardial infarction [facility]) 1994; 2011   Pneumonia 1946; 2015   Prostate enlargement 07/2014   observed on CT   Tobacco abuse    Ventricular tachycardia ([place])    a. 08/2009 s/p BSX E110 Teligen 100 AICD, ser#: [id number];  b. 08/2008 VT req ATP - detection reprogrammed from 160 to 150. c. EPS and VT ablation by Dr. Lovena Le 12/21/2014    Past Surgical History:  Procedure Laterality Date   BIOPSY  12/21/2017   Procedure: BIOPSY;  Surgeon: [name], MD;  Location: [facility];  Service: Endoscopy;;   CATARACT EXTRACTION W/ INTRAOCULAR LENS  IMPLANT, BILATERAL Bilateral ~ 2011   COLONOSCOPY     COLONOSCOPY WITH PROPOFOL N/A 12/21/2017   Procedure: COLONOSCOPY WITH PROPOFOL;  Surgeon: [name], MD;  Location: [facility];  Service: Endoscopy;  Laterality: N/A;   ELECTROPHYSIOLOGIC STUDY N/A 12/21/2014   Procedure: V Tach Ablation;  Surgeon: [name], MD;  Location: [place] [facility];  Service: Cardiovascular;  Laterality: N/A;   ERCP N/A 11/16/2014   Procedure: ENDOSCOPIC RETROGRADE CHOLANGIOPANCREATOGRAPHY (ERCP);  Surgeon: [name], MD;  Location: [place];  Service: Endoscopy;  Laterality: N/A;   ESOPHAGOGASTRODUODENOSCOPY (EGD) WITH PROPOFOL N/A 12/21/2017   Procedure: ESOPHAGOGASTRODUODENOSCOPY (EGD) WITH PROPOFOL;  Surgeon: [name], MD;  [facility];  Service: Endoscopy;  Laterality: N/A;   EYE SURGERY     FOOT SURGERY Left 2005   "fixed bone that stuck out in my ankle area"   HEMORRHOID BANDING     IMPLANTABLE CARDIOVERTER DEFIBRILLATOR IMPLANT  09/06/09   BSX dual chamber ICD implanted in [place] for cardiac arrest 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   [place] Right ~ [place] N/A 11/25/2012   demonstrated EF 30%, inferior akinesis  with mild hypokinesis of all walls, patent LAD and RCA stents; ostial PDA with 80-90% obstruction with medical therapy recommended   MOHS SURGERY  2008   nose, skin graft   POLYPECTOMY  12/21/2017   Procedure: POLYPECTOMY;  Surgeon: [name], MD;  [facility];  Service: Endoscopy;;   RETINAL DETACHMENT SURGERY Right 2013   TENOLYSIS Right 12/21/2013   Procedure: TENOLYSIS FLEXOR CARPI RADIALIS ,DEBRIDEMENT RIGHT JOINT WRIST,DEBRIDEMENT SCAPHOTRAPEZIAL TRAPEZOID, REPAIR OF EXTENSOR HOOD;  Surgeon: Daryll Brod, MD;  Location: [place];  Service: Orthopedics;  Laterality: Right;   TOE SURGERY Right 09/2019   3rd toe/hammer toe   V-TACH ABLATION  12/21/2014   VIDEO BRONCHOSCOPY Bilateral 01/09/2016   Procedure: VIDEO BRONCHOSCOPY WITHOUT FLUORO;  Surgeon: Juanito Doom, MD;  [facility];  Service: Cardiopulmonary;  Laterality: Bilateral;     Home Medications:  Prior to Admission medications  Medication Sig Start Date End Date Taking? Authorizing Provider  albuterol (PROVENTIL) (2.5 MG/3ML) 0.083% nebulizer solution USE 1 VIAL VIA NEBULIZER EVERY 6 HOURS AS NEEDED FOR WHEEZING OR SHORTNESS OF BREATH 07/18/19   Laurin Coder, MD  amiodarone (PACERONE) 200 MG tablet TAKE ONE TABLET BY MOUTH DAILY 07/22/21   Belva Crome, MD  apixaban (ELIQUIS) 5 MG TABS tablet Take 1 tablet (5 mg total) by mouth 2 (two) times daily. 10/10/21   Belva Crome, MD  Artificial Tear Solution (SOOTHE XP OP) Place 2 drops into both eyes daily as needed (dry eyes).    [provider]  ascorbic acid (VITAMIN C) 500 MG tablet Take 1 tablet by mouth daily. 02/10/22   [provider]  benazepril (LOTENSIN) 10 MG tablet Take 0.5 tablets (5 mg total) by mouth daily. 01/07/22   Belva Crome, MD  buPROPion (WELLBUTRIN) 75 MG tablet TAKE ONE TABLET BY MOUTH DAILY 09/10/21   Fargo, Amy E, NP  busPIRone (BUSPAR) 15 MG tablet Take 15 mg by mouth 3 (three) times  daily. Patient taking 10 mg by mouth twice daily    [provider]  calcium citrate (CALCITRATE - DOSED IN MG ELEMENTAL CALCIUM) 950 (200 Ca) MG tablet Take by mouth. 02/10/22   [provider]  carvedilol (COREG) 3.125 MG tablet Take 1 tablet (3.125 mg total) by mouth 2 (two) times daily. 03/11/22   Belva Crome, MD  cetirizine (ZYRTEC) 10 MG tablet Take 1 tablet by mouth daily. 12/16/17   [provider]  Cyanocobalamin (VITAMIN B-12) 5000 MCG SUBL Place under the tongue daily.    [provider]  escitalopram (LEXAPRO) 20 MG tablet Take 20 mg by mouth daily. Take 1/2 tablet (10 mg total ) by mouth daily    [provider]  fluticasone (FLONASE) 50 MCG/ACT nasal spray Place 2 sprays into both nostrils daily. 02/13/21   Fargo, Amy E, NP  furosemide (LASIX) 40 MG tablet Take 0.5 tablets (20 mg total) by mouth daily. 11/13/21   Fargo, Amy E, NP  gabapentin (NEURONTIN) 300 MG capsule Take 1 capsule (300 mg total) by mouth 2 (two) times daily. Pt take 1 tablet 300 mg at night and 100 mg in the morning. 03/12/22   Fargo, Amy E, NP  guaiFENesin (MUCINEX) 600 MG 12 hr tablet Take 1 tablet by mouth 2 (two) times daily.    [provider]  Ipratropium-Albuterol (COMBIVENT RESPIMAT) 20-100 MCG/ACT AERS respimat Inhale 1 puff into the lungs every 6 (six) hours. Shortness of breath or wheezing 09/18/21   [name] E, NP  IRON PO Take by mouth daily.     [provider]  MAGNESIUM-OXIDE 400 (241.3 Mg) MG tablet TAKE 1 TABSULE BY MOUTH DAILY 11/24/18   Hoyt Koch, MD  mexiletine (MEXITIL) 200 MG capsule Take 1 capsule (200 mg total) by mouth 2 (two) times daily. 10/10/21   Belva Crome, MD  Multiple Vitamins-Minerals (CENTRUM ADULTS PO) Take by mouth daily.    [provider]  mupirocin ointment (BACTROBAN) 2 % Apply 1 application. topically 2 (two) times daily. Apply to lower leg wounds daily prn 07/17/21   Fargo, Amy E, NP   nitroGLYCERIN (NITROSTAT) 0.4 MG SL tablet Place 1 tablet (0.4 mg total) under the tongue every 5 (five) minutes as needed for chest pain. DISSOLVE 1 TABLET UNDER THE TONGUE EVERY 5 MINUTES FOR 3 DOSES 05/08/21   Belva Crome, MD  omeprazole ([place]) 20  MG capsule Take 20 mg by mouth daily.     [provider]  polyethylene glycol powder (GLYCOLAX/MIRALAX) 17 GM/SCOOP powder Take 1 capful in 8 ounces of fluid each morning 03/08/22   Luster Landsberg, MD  potassium chloride SA (KLOR-CON M) 20 MEQ tablet Take 20 mEq by mouth once.    [provider]  Respiratory Therapy Supplies (FLUTTER) DEVI Use as directed 03/16/17   Juanito Doom, MD  rosuvastatin (CRESTOR) 40 MG tablet Take 1 tablet (40 mg total) by mouth daily. 06/05/21   [name], MD  Sennosides-Docusate Sodium (STOOL SOFTENER/LAXATIVE PO) Take 2 tablets by mouth daily.    [provider]  Spacer/Aero-Holding Chambers (OPTICHAMBER DIAMOND) [place] optichamber [facility] 09/12/19   Olalere, Ernesto Rutherford, MD  STIOLTO RESPIMAT 2.5-2.5 MCG/ACT AERS Inhale 2 puffs into the lungs daily. 09/22/21   [provider]  tamsulosin (FLOMAX) 0.4 MG CAPS capsule TAKE ONE CAPSULE BY MOUTH DAILY AFTER SUPPER 06/30/21   Yvonna Alanis, NP    Inpatient Medications: Scheduled Meds:  Continuous Infusions:  PRN Meds:   Allergies:    Allergies  Allergen Reactions   Sulfa Antibiotics Hives    Social History:   Social History   Socioeconomic History   Marital status: Married    Spouse name: Not on file   Number of children: Not on file   Years of education: Not on file   Highest education level: Not on file  Occupational History   Occupation: Retired  Tobacco Use   Smoking status: Former    Packs/day: 1.00    Years: 55.00    Total pack years: 55.00    Types: Cigarettes   Smokeless tobacco: Never   Tobacco comments:    off/on, always ready to quit but does not work out  Scientific laboratory technician Use: Never used   Substance and Sexual Activity   Alcohol use: Yes    Alcohol/week: 0.0 standard drinks of alcohol    Comment: occ   Drug use: No   Sexual activity: Not Currently  Other Topics Concern   Not on file  Social History Narrative   Not on file   Social Determinants of Health   Financial Resource Strain: Medium Risk (03/06/2021)   Overall Financial Resource Strain (CARDIA)    Difficulty of Paying Living Expenses: Somewhat hard  Food Insecurity: Food Insecurity Present (03/06/2021)   Hunger Vital Sign    Worried About Running Out of Food in the Last Year: Sometimes true    Ran Out of Food in the Last Year: Never true  Transportation Needs: No Transportation Needs (03/06/2021)   PRAPARE - Hydrologist (Medical): No    Lack of Transportation (Non-Medical): No  Physical Activity: Insufficiently Active (03/06/2021)   Exercise Vital Sign    Days of Exercise per Week: 4 days    Minutes of Exercise per Session: 20 min  Stress: Stress Concern Present (03/06/2021)   [place]    Feeling of Stress : Rather much  Social Connections: Moderately Isolated (03/06/2021)   Social Connection and Isolation Panel [NHANES]    Frequency of Communication with Friends and Family: More than three times a week    Frequency of Social Gatherings with Friends and Family: Once a week    Attends Religious Services: Never    Marine scientist or Organizations: No    Attends Archivist Meetings: Never  Marital Status: Married  Human resources officer Violence: Not At Risk (03/06/2021)   Humiliation, Afraid, Rape, and Kick questionnaire    Fear of Current or Ex-Partner: No    Emotionally Abused: No    Physically Abused: No    Sexually Abused: No    Family History:    Family History  Problem Relation Age of Onset   Heart attack Brother    CAD Father    Hypertension Father    CAD Mother    Hypertension Mother     Hypertension Brother    Stroke Neg Hx      ROS:  Please see the history of present illness.   All other ROS reviewed and negative.     Physical Exam/Data:   Vitals:   03/18/22 0800 03/18/22 0815 03/18/22 0835 03/18/22 0850  BP: (!) 152/92 (!) 140/77 (!) 147/88   Pulse: 72 73 76 73  Resp: (!) 26 16 (!) 28 14  Temp:      TempSrc:      SpO2: 96% 96% 95% 97%  Weight:       No intake or output data in the 24 hours ending 03/18/22 0906    03/18/2022    4:49 AM 03/13/2022    4:00 PM 03/12/2022    1:14 PM  Last 3 Weights  Weight (lbs) 192 lb 192 lb 192 lb  Weight (kg) 87.091 kg 87.091 kg 87.091 kg     Body mass index is 26.04 kg/m.  General:  Well nourished, well developed, in no acute distress HEENT: normal Neck: no JVD Vascular: No carotid bruits; Distal pulses 2+ bilaterally Cardiac:  normal S1, S2; RRR; no murmur  Lungs:  clear to auscultation bilaterally, no wheezing, rhonchi or rales  Abd: soft, nontender, no hepatomegaly  Ext: no edema Musculoskeletal:  No deformities, BUE and BLE strength normal and equal Skin: warm and dry  Neuro:  CNs 2-12 intact, no focal abnormalities noted Psych:  Normal affect   EKG:  The EKG was personally reviewed and demonstrates:   Telemetry:  Telemetry was personally reviewed and demonstrates:  sinus rhythm, 68 bpm, prolonged QT, IVCD  Relevant CV Studies:  Echo: 03/2021  IMPRESSIONS     1. Left ventricular ejection fraction, by estimation, is 40 to 45%. The  left ventricle has mildly decreased function. The left ventricle  demonstrates regional wall motion abnormalities witih basal to mid  inferior and inferolateral akinesis,  anterolateral hypokinesis. The left ventricular internal cavity size was  mildly dilated. There is mild left ventricular hypertrophy. Left  ventricular diastolic parameters are consistent with Grade I diastolic  dysfunction (impaired relaxation).   2. Right ventricular systolic function is normal.  The right ventricular  size is normal. There is normal pulmonary artery systolic pressure. The  estimated right ventricular systolic pressure is 61.4 mmHg.   3. Left atrial size was moderately dilated.   4. Right atrial size was mildly dilated.   5. The mitral valve is normal in structure. Trivial mitral valve  regurgitation. No evidence of mitral stenosis.   6. The aortic valve is tricuspid. Aortic valve regurgitation is trivial.  Aortic valve sclerosis/calcification is present, without any evidence of  aortic stenosis.   7. Aortic dilatation noted. There is moderate dilatation of the ascending  aorta, measuring 44 mm.   8. The inferior vena cava is normal in size with greater than 50%  respiratory variability, suggesting right atrial pressure of 3 mmHg.   FINDINGS   Left Ventricle: Left ventricular  ejection fraction, by estimation, is 40  to 45%. The left ventricle has mildly decreased function. The left  ventricle demonstrates regional wall motion abnormalities. The left  ventricular internal cavity size was mildly  dilated. There is mild left ventricular hypertrophy. Left ventricular  diastolic parameters are consistent with Grade I diastolic dysfunction  (impaired relaxation).   Right Ventricle: The right ventricular size is normal. No increase in  right ventricular wall thickness. Right ventricular systolic function is  normal. There is normal pulmonary artery systolic pressure. The tricuspid  regurgitant velocity is 2.31 m/s, and   with an assumed right atrial pressure of 3 mmHg, the estimated right  ventricular systolic pressure is 42.5 mmHg.   Left Atrium: Left atrial size was moderately dilated.   Right Atrium: Right atrial size was mildly dilated.   Pericardium: There is no evidence of pericardial effusion.   Mitral Valve: The mitral valve is normal in structure. Trivial mitral  valve regurgitation. No evidence of mitral valve stenosis.   Tricuspid Valve: The  tricuspid valve is normal in structure. Tricuspid  valve regurgitation is trivial.   Aortic Valve: The aortic valve is tricuspid. Aortic valve regurgitation is  trivial. Aortic regurgitation PHT measures 688 msec. Aortic valve  sclerosis/calcification is present, without any evidence of aortic  stenosis. Aortic valve mean gradient measures  7.0 mmHg. Aortic valve peak gradient measures 13.2 mmHg. Aortic valve  area, by VTI measures 3.05 cm.   Pulmonic Valve: The pulmonic valve was normal in structure. Pulmonic valve  regurgitation is trivial.   Aorta: Aortic dilatation noted. There is moderate dilatation of the  ascending aorta, measuring 44 mm.   Venous: The inferior vena cava is normal in size with greater than 50%  respiratory variability, suggesting right atrial pressure of 3 mmHg.   IAS/Shunts: No atrial level shunt detected by color flow Doppler.   Additional Comments: A device lead is visualized in the right ventricle.   Laboratory Data:  High Sensitivity Troponin:   Recent Labs  Lab 02/25/22 1435 02/25/22 1741 03/18/22 0438 03/18/22 0708  TROPONINIHS 8 8 69* 497*     Chemistry Recent Labs  Lab 03/18/22 0438  NA 138  K 3.8  CL 105  CO2 25  GLUCOSE 104*  BUN 11  CREATININE 0.88  CALCIUM 9.2  GFRNONAA >60  ANIONGAP 8    Recent Labs  Lab 03/18/22 0438  PROT 6.2*  ALBUMIN 3.3*  AST 24  ALT 18  ALKPHOS 51  BILITOT 0.6   Lipids No results for input(s): "CHOL", "TRIG", "HDL", "LABVLDL", "LDLCALC", "CHOLHDL" in the last 168 hours.  Hematology Recent Labs  Lab 03/18/22 0438  WBC 3.0*  RBC 3.10*  HGB 10.9*  HCT 33.4*  MCV 107.7*  MCH 35.2*  MCHC 32.6  RDW 17.8*  PLT 186   Thyroid No results for input(s): "TSH", "FREET4" in the last 168 hours.  BNP Recent Labs  Lab 03/18/22 0545  BNP 90.1    DDimer No results for input(s): "DDIMER" in the last 168 hours.   Radiology/Studies:  DG Chest 2 View  Result Date: 03/18/2022 CLINICAL DATA:   Chest pain EXAM: CHEST - 2 VIEW COMPARISON:  02/25/2022 FINDINGS: Normal heart size and stable aortic tortuosity. Sizable hiatal hernia. Dual-chamber ICD leads from the left. There is no edema, consolidation, effusion, or pneumothorax. Artifact from EKG leads. IMPRESSION: Stable exam.  No acute finding. Electronically Signed   By: Jorje Guild M.D.   On: 03/18/2022 05:45  Assessment and Plan:   ZYMIER RODGERS is a 81 y.o. male with a hx of CAD status post inferior MI/cardiac arrest '95, MI treated with PCI of LAD/RCA '11, ICM s/p AICD implant '10, VT s/p ablation '16, paroxysmal atrial fibrillation on Eliquis, AAA, hypertension, hyperlipidemia, CKD stage III who is being seen 03/18/2022 for the evaluation of chest pain at the request of Dr. Leonette Monarch.  NSTEMI CAD s/p prior PCI of LAD/RCA, medically treated disease in PDA/Lcx -- Reports waking up around 2 AM this morning and having chest pain that radiated across his anterior chest.  Took 2 sublingual nitroglycerin without significant relief.  Initial high-sensitivity troponin 69>>479.  He was given GI cocktail in the ED with some relief, still with ongoing chest pressure. Given rising troponin will need cardiac cath. Given last dose of Eliquis last evening at 11:30pm, will plan for tomorrow.  -- start IV heparin, statin, coreg -- echo -- NPO at midnight   Shared Decision Making/Informed Consent The risks [stroke (1 in 1000), death (1 in 1000), kidney failure [usually temporary] (1 in 500), bleeding (1 in 200), allergic reaction [possibly serious] (1 in 200)], benefits (diagnostic support and management of coronary artery disease) and alternatives of a cardiac catheterization were discussed in detail with Steven Guzman and he is willing to proceed.   Paroxysmal atrial fibrillation -- Currently in sinus rhythm -- Last dose of Eliquis was 11:30 PM, holding with plans for cath -- IV heparin  ICM S/p AICD VT s/p ablation '16 -- Last seen in  the office 12/15 with Dr. Lovena Le.  Interrogation noted some episodes of slow VT, no atrial fibrillation.  Device at [facility] with plans for upcoming gen change in January --Device interrogation in the ED pending -- Continue amiodarone, mexiletine, coreg   Hypertension -- Blood pressure well-controlled -- continue   Hyperlipidemia -- Continue Crestor 40 mg daily  Risk Assessment/Risk Scores:   TIMI Risk Score for Unstable Angina or Non-ST Elevation MI:   The patient's TIMI risk score is 4, which indicates a 20% risk of all cause mortality, new or recurrent myocardial infarction or need for urgent revascularization in the next 14 days.  CHA2DS2-VASc Score = 5  This indicates a 7.2% annual risk of stroke. The patient's score is based upon: CHF History: 1 HTN History: 1 Diabetes History: 0 Stroke History: 0 Vascular Disease History: 1 Age Score: 2 Gender Score: 0   For questions or updates, please contact [place] Please consult [email or web address] for contact info under    Signed, Reino Bellis, NP  03/18/2022 9:06 AM  I have examined the patient and reviewed assessment and plan and discussed with patient.  Agree with above as stated.    Patient with hip fracture several weeks ago.  Had some sharp chest pains on Friday and took nitroglycerin.  Early this morning had nausea and tightness in his chest.  Initial troponin mildly elevated.  Repeat was further increased.  Initial ECG was concerning for some ST changes in the inferior and lateral leads.  Repeat ECG has shown resolution.  His symptoms have basically resolved.  His main complaint now is that he is hungry and he wants to eat.    We discussed the risks and benefits of cardiac catheterization.  Given that he is on Eliquis, the safest option would be to do the catheterization tomorrow.  As long as he stays pain-free and hemodynamically stable, we will plan to delay catheterization until tomorrow.  He has known CAD with  prior PCI.  Last cath in 2014.  If ECG changes recur or he starts having intractable pain, will have catheterization performed more urgently.  I spoke to the patient's wife by phone and discussed the plan.  She was appreciative.   Larae Grooms

## 2022-03-19 ENCOUNTER — Inpatient Hospital Stay (HOSPITAL_COMMUNITY): Payer: Medicare Other

## 2022-03-19 ENCOUNTER — Encounter (HOSPITAL_COMMUNITY): Payer: Self-pay | Admitting: Interventional Cardiology

## 2022-03-19 ENCOUNTER — Encounter (HOSPITAL_COMMUNITY)
Admission: EM | Disposition: A | Discharge status: Home / Self Care | Payer: Self-pay | Source: Home / Self Care | Attending: Interventional Cardiology

## 2022-03-19 DIAGNOSIS — I480 Paroxysmal atrial fibrillation: Secondary | ICD-10-CM

## 2022-03-19 DIAGNOSIS — E782 Mixed hyperlipidemia: Secondary | ICD-10-CM | POA: Diagnosis not present

## 2022-03-19 DIAGNOSIS — Z95810 Presence of automatic (implantable) cardiac defibrillator: Secondary | ICD-10-CM

## 2022-03-19 DIAGNOSIS — I2510 Atherosclerotic heart disease of native coronary artery without angina pectoris: Secondary | ICD-10-CM

## 2022-03-19 DIAGNOSIS — I214 Non-ST elevation (NSTEMI) myocardial infarction: Secondary | ICD-10-CM | POA: Diagnosis not present

## 2022-03-19 DIAGNOSIS — I10 Essential (primary) hypertension: Secondary | ICD-10-CM

## 2022-03-19 DIAGNOSIS — R079 Chest pain, unspecified: Secondary | ICD-10-CM | POA: Diagnosis not present

## 2022-03-19 HISTORY — PX: LEFT HEART CATH AND CORONARY ANGIOGRAPHY: CATH118249

## 2022-03-19 LAB — BASIC METABOLIC PANEL
Anion gap: 7 (ref 5–15)
BUN: 11 mg/dL (ref 8–23)
CO2: 22 mmol/L (ref 22–32)
Calcium: 8.6 mg/dL — ABNORMAL LOW (ref 8.9–10.3)
Chloride: 104 mmol/L (ref 98–111)
Creatinine, Ser: 0.85 mg/dL (ref 0.61–1.24)
GFR, Estimated: >60 mL/min (ref >60)
Glucose, Bld: 153 mg/dL — ABNORMAL HIGH (ref 70–99)
Potassium: 3.8 mmol/L (ref 3.5–5.1)
Sodium: 133 mmol/L — ABNORMAL LOW (ref 135–145)

## 2022-03-19 LAB — ECHOCARDIOGRAM COMPLETE
AR max vel: 1.94 cm2
AV Area VTI: 2.35 cm2
AV Area mean vel: 1.97 cm2
AV Mean grad: 6 mmHg
AV Peak grad: 12.5 mmHg
Ao pk vel: 1.77 m/s
Area-P 1/2: 3.34 cm2
Calc EF: 25.3 %
Height: 72 in
MV M vel: 5.85 m/s
MV Peak grad: 136.9 mmHg
Radius: 0.6 cm
S' Lateral: 5 cm
Single Plane A2C EF: 19.6 %
Single Plane A4C EF: 31.4 %
Weight: 3075.86 oz

## 2022-03-19 LAB — LIPID PANEL
Cholesterol: 162 mg/dL (ref 0–200)
HDL: 44 mg/dL (ref >40)
LDL Cholesterol: 103 mg/dL — ABNORMAL HIGH (ref 0–99)
Total CHOL/HDL Ratio: 3.7 RATIO
Triglycerides: 75 mg/dL (ref <150)
VLDL: 15 mg/dL (ref 0–40)

## 2022-03-19 LAB — CBC
HCT: 31.8 % — ABNORMAL LOW (ref 39.0–52.0)
Hemoglobin: 10 g/dL — ABNORMAL LOW (ref 13.0–17.0)
MCH: 34 pg (ref 26.0–34.0)
MCHC: 31.4 g/dL (ref 30.0–36.0)
MCV: 108.2 fL — ABNORMAL HIGH (ref 80.0–100.0)
Platelets: 170 10*3/uL (ref 150–400)
RBC: 2.94 MIL/uL — ABNORMAL LOW (ref 4.22–5.81)
RDW: 17.7 % — ABNORMAL HIGH (ref 11.5–15.5)
WBC: 3.6 10*3/uL — ABNORMAL LOW (ref 4.0–10.5)
nRBC: 0 % (ref 0.0–0.2)

## 2022-03-19 LAB — APTT: aPTT: 57 seconds — ABNORMAL HIGH (ref 24–36)

## 2022-03-19 LAB — HEPARIN LEVEL (UNFRACTIONATED): Heparin Unfractionated: 0.59 IU/mL (ref 0.30–0.70)

## 2022-03-19 SURGERY — LEFT HEART CATH AND CORONARY ANGIOGRAPHY
Anesthesia: LOCAL

## 2022-03-19 MED ORDER — LIDOCAINE HCL (PF) 1 % IJ SOLN
INTRAMUSCULAR | Status: DC | PRN
  Administered 2022-03-19: 2 mL

## 2022-03-19 MED ORDER — FENTANYL CITRATE (PF) 100 MCG/2ML IJ SOLN
INTRAMUSCULAR | Status: AC
  Filled 2022-03-19: qty 2

## 2022-03-19 MED ORDER — APIXABAN 5 MG PO TABS
5.0000 mg | ORAL_TABLET | Freq: Two times a day (BID) | ORAL | Status: DC
  Administered 2022-03-19 – 2022-03-20 (×2): 5 mg via ORAL
  Filled 2022-03-19 (×2): qty 1

## 2022-03-19 MED ORDER — SODIUM CHLORIDE 0.9% FLUSH
3.0000 mL | Freq: Two times a day (BID) | INTRAVENOUS | Status: DC
  Administered 2022-03-19 – 2022-03-20 (×2): 3 mL via INTRAVENOUS

## 2022-03-19 MED ORDER — ASPIRIN 81 MG PO CHEW
81.0000 mg | CHEWABLE_TABLET | ORAL | Status: AC
  Administered 2022-03-19: 81 mg via ORAL
  Filled 2022-03-19: qty 1

## 2022-03-19 MED ORDER — LIDOCAINE HCL (PF) 1 % IJ SOLN
INTRAMUSCULAR | Status: AC
  Filled 2022-03-19: qty 30

## 2022-03-19 MED ORDER — SODIUM CHLORIDE 0.9% FLUSH: 3.0000 mL | INTRAVENOUS | Status: DC | PRN

## 2022-03-19 MED ORDER — SODIUM CHLORIDE 0.9 % IV SOLN: INTRAVENOUS | Status: DC

## 2022-03-19 MED ORDER — MIDAZOLAM HCL 2 MG/2ML IJ SOLN
INTRAMUSCULAR | Status: AC
  Filled 2022-03-19: qty 2

## 2022-03-19 MED ORDER — VERAPAMIL HCL 2.5 MG/ML IV SOLN
INTRAVENOUS | Status: AC
  Filled 2022-03-19: qty 2

## 2022-03-19 MED ORDER — TRAMADOL HCL 50 MG PO TABS
50.0000 mg | ORAL_TABLET | Freq: Four times a day (QID) | ORAL | Status: DC | PRN
  Administered 2022-03-19 (×2): 50 mg via ORAL
  Filled 2022-03-19 (×2): qty 1

## 2022-03-19 MED ORDER — FENTANYL CITRATE (PF) 100 MCG/2ML IJ SOLN
INTRAMUSCULAR | Status: DC | PRN
  Administered 2022-03-19: 25 ug via INTRAVENOUS

## 2022-03-19 MED ORDER — HEPARIN SODIUM (PORCINE) 1000 UNIT/ML IJ SOLN
INTRAMUSCULAR | Status: AC
  Filled 2022-03-19: qty 10

## 2022-03-19 MED ORDER — HEPARIN SODIUM (PORCINE) 1000 UNIT/ML IJ SOLN
INTRAMUSCULAR | Status: DC | PRN
  Administered 2022-03-19: 4500 [IU] via INTRAVENOUS

## 2022-03-19 MED ORDER — MIDAZOLAM HCL 2 MG/2ML IJ SOLN
INTRAMUSCULAR | Status: DC | PRN
  Administered 2022-03-19: 1 mg via INTRAVENOUS

## 2022-03-19 MED ORDER — SODIUM CHLORIDE 0.9 % IV SOLN: 250.0000 mL | INTRAVENOUS | Status: DC | PRN

## 2022-03-19 MED ORDER — IOHEXOL 350 MG/ML SOLN
INTRAVENOUS | Status: DC | PRN
  Administered 2022-03-19: 60 mL

## 2022-03-19 MED ORDER — HEPARIN (PORCINE) IN NACL 1000-0.9 UT/500ML-% IV SOLN
INTRAVENOUS | Status: AC
  Filled 2022-03-19: qty 1000

## 2022-03-19 MED ORDER — VERAPAMIL HCL 2.5 MG/ML IV SOLN
INTRAVENOUS | Status: DC | PRN
  Administered 2022-03-19: 10 mL via INTRA_ARTERIAL

## 2022-03-19 MED ORDER — HEPARIN (PORCINE) IN NACL 1000-0.9 UT/500ML-% IV SOLN
INTRAVENOUS | Status: DC | PRN
  Administered 2022-03-19 (×2): 500 mL

## 2022-03-19 MED ORDER — SODIUM CHLORIDE 0.9 % WEIGHT BASED INFUSION
1.0000 mL/kg/h | INTRAVENOUS | Status: AC
  Administered 2022-03-19: 1 mL/kg/h via INTRAVENOUS

## 2022-03-19 SURGICAL SUPPLY — 12 items
CATH 5FR JL3.5 JR4 ANG PIG MP (CATHETERS) IMPLANT
CATH INFINITI 5FR JL4 (CATHETERS) IMPLANT
DEVICE RAD COMP TR BAND LRG (VASCULAR PRODUCTS) IMPLANT
GLIDESHEATH SLEND SS 6F .021 (SHEATH) IMPLANT
GUIDEWIRE INQWIRE 1.5J.035X260 (WIRE) IMPLANT
INQWIRE 1.5J .035X260CM (WIRE) ×1
KIT HEART LEFT (KITS) ×1 IMPLANT
PACK CARDIAC CATHETERIZATION (CUSTOM PROCEDURE TRAY) ×1 IMPLANT
SYR MEDRAD MARK 7 150ML (SYRINGE) ×1 IMPLANT
TRANSDUCER W/STOPCOCK (MISCELLANEOUS) ×1 IMPLANT
TUBING CIL FLEX 10 FLL-RA (TUBING) ×1 IMPLANT
WIRE HI TORQ VERSACORE-J 145CM (WIRE) IMPLANT

## 2022-03-19 NOTE — ED Notes (Signed)
Cath lab provider and NT grabbed pt from ED room. Pt well appearing upon transfer of care.

## 2022-03-19 NOTE — Interval H&P Note (Signed)
History and Physical Interval Note:  03/19/2022 12:38 PM  Steven Guzman  has presented today for surgery, with the diagnosis of nstemi.  The various methods of treatment have been discussed with the patient and family. After consideration of risks, benefits and other options for treatment, the patient has consented to  Procedure(s): LEFT HEART CATH AND CORONARY ANGIOGRAPHY (N/A) as a surgical intervention.  The patient's history has been reviewed, patient examined, no change in status, stable for surgery.  I have reviewed the patient's chart and labs.  Questions were answered to the patient's satisfaction.   Cath Lab Visit (complete for each Cath Lab visit)  Clinical Evaluation Leading to the Procedure:   ACS: Yes.    Non-ACS:    Anginal Classification: CCS IV  Anti-ischemic medical therapy: Minimal Therapy (1 class of medications)  Non-Invasive Test Results: No non-invasive testing performed  Prior CABG: No previous CABG        Steven Guzman [facility] 03/19/2022 12:38 PM

## 2022-03-19 NOTE — Progress Notes (Signed)
ANTICOAGULATION CONSULT NOTE  Pharmacy Consult for heparin Indication: chest pain/ACS / AFib  Allergies  Allergen Reactions   Sulfa Antibiotics Hives    Patient Measurements: Weight: 87.1 kg (192 lb) Heparin Dosing Weight: 87kg  Vital Signs: Temp: 98 F (36.7 C) (12/21 0417) Temp Source: Oral (12/21 0417) BP: 155/88 (12/21 0640) Pulse Rate: 72 (12/21 0640)  Labs: Recent Labs    03/18/22 0438 03/18/22 0708 03/18/22 1641 03/18/22 2224 03/19/22 0205 03/19/22 0730  HGB 10.9*  --   --   --  10.0*  --   HCT 33.4*  --   --   --  31.8*  --   PLT 186  --   --   --  170  --   APTT  --   --   --  36  --  57*  HEPARINUNFRC  --   --  0.69  --   --   --   CREATININE 0.88  --   --   --  0.85  --   TROPONINIHS 69* 497*  --   --   --   --      Estimated Creatinine Clearance: 72.3 mL/min (by C-G formula based on SCr of 0.85 mg/dL).   Medical History: Past Medical History:  Diagnosis Date   AICD (automatic cardioverter/defibrillator) present    Aneurysm ([place])    a. Aneurysmal infrarenal aorta up to 33 mm on CT 10/2014, recommended f/u due 10/2017   Anginal pain ([place])    Anxiety    Basal cell carcinoma of nose    S/P MOHS   Biliary acute pancreatitis    CAD (coronary artery disease)    a. s/p MI in 1994 with PCI to LAD at that time b. cath 10/2012 demonstrated EF 30%, inferior akinesis with mild hypokinesis of all walls, patent LAD and RCA stents; ostial PDA with 80-90% obstruction with medical therapy recommended    Chronic systolic CHF (congestive heart failure) (HCC)    EF 30 to 35 % as of 09/2014.    CKD (chronic kidney disease), stage III ([place])    Complication of anesthesia 10/2014   "had to have defibrillator w/ERCP"   COPD (chronic obstructive pulmonary disease) ([place])    a. followed by pulmonary, COPD GOLD stage II   Depression    Diverticulosis of colon 07/2014   noted on CT   GERD (gastroesophageal reflux disease)    Hiatal hernia    Hyperglycemia 10/2012.    Hyperlipidemia    Hypertension    Myocardial infarction [facility]) 1994; 2011   Pneumonia 1946; 2015   Prostate enlargement 07/2014   observed on CT   Tobacco abuse    Ventricular tachycardia ([place])    a. 08/2009 s/p BSX E110 Teligen 100 AICD, ser#: [id number];  b. 08/2008 VT req ATP - detection reprogrammed from 160 to 150. c. EPS and VT ablation by Dr. Lovena Le 12/21/2014     Assessment: 64 yoM admitted with ACS. Pt with hx CAD and AFib on apixaban PTA, transitioned to IV heparin.  aPTT this am is subtherapeutic at 57 seconds, CBC stable.   Goal of Therapy:  Heparin level 0.3-0.7 units/ml aPTT 66-102 seconds Monitor platelets by anticoagulation protocol: Yes   Plan:  -Increase heparin to 1750 units/h -F/U plans post/cath  Arrie Senate, PharmD, BCPS, [place] Clinical Pharmacist [phone] Please check AMION for all [place] numbers 03/19/2022

## 2022-03-19 NOTE — Care Management (Signed)
  Transition of Care [facility]) Screening Note   Patient Details  Name: Steven Guzman Date of Birth: Apr 16, 1940   Transition of [facility]) CM/SW Contact:    Bethena Roys, RN Phone Number: 03/19/2022, 1:34 PM    Transition of Care Department [facility]) has reviewed the patient and no TOC needs have been identified at this time. Post LHC today. We will continue to monitor patient advancement through interdisciplinary progression rounds. If new patient transition needs arise, please place a TOC consult.

## 2022-03-19 NOTE — Plan of Care (Signed)
  Problem: Education: Goal: Knowledge of cardiac device and self-care will improve Outcome: Progressing Goal: Ability to safely manage health related needs after discharge will improve Outcome: Progressing Goal: Individualized Educational Video(s) Outcome: Progressing   Problem: Cardiac: Goal: Ability to achieve and maintain adequate cardiopulmonary perfusion will improve Outcome: Progressing   Problem: Education: Goal: Understanding of cardiac disease, CV risk reduction, and recovery process will improve Outcome: Progressing Goal: Individualized Educational Video(s) Outcome: Progressing   Problem: Activity: Goal: Ability to tolerate increased activity will improve Outcome: Progressing   Problem: Cardiac: Goal: Ability to achieve and maintain adequate cardiovascular perfusion will improve Outcome: Progressing   Problem: Health Behavior/Discharge Planning: Goal: Ability to safely manage health-related needs after discharge will improve Outcome: Progressing   Problem: Education: Goal: Understanding of CV disease, CV risk reduction, and recovery process will improve Outcome: Progressing Goal: Individualized Educational Video(s) Outcome: Progressing   Problem: Activity: Goal: Ability to return to baseline activity level will improve Outcome: Progressing   Problem: Cardiovascular: Goal: Ability to achieve and maintain adequate cardiovascular perfusion will improve Outcome: Progressing Goal: Vascular access site(s) Level 0-1 will be maintained Outcome: Progressing   Problem: Health Behavior/Discharge Planning: Goal: Ability to safely manage health-related needs after discharge will improve Outcome: Progressing   Problem: Education: Goal: Knowledge of General Education information will improve Description: Including pain rating scale, medication(s)/side effects and non-pharmacologic comfort measures Outcome: Progressing   Problem: Health Behavior/Discharge  Planning: Goal: Ability to manage health-related needs will improve Outcome: Progressing   Problem: Clinical Measurements: Goal: Ability to maintain clinical measurements within normal limits will improve Outcome: Progressing Goal: Will remain free from infection Outcome: Progressing Goal: Diagnostic test results will improve Outcome: Progressing Goal: Respiratory complications will improve Outcome: Progressing Goal: Cardiovascular complication will be avoided Outcome: Progressing   Problem: Activity: Goal: Risk for activity intolerance will decrease Outcome: Progressing   Problem: Nutrition: Goal: Adequate nutrition will be maintained Outcome: Progressing   Problem: Coping: Goal: Level of anxiety will decrease Outcome: Progressing   Problem: Elimination: Goal: Will not experience complications related to bowel motility Outcome: Progressing Goal: Will not experience complications related to urinary retention Outcome: Progressing   Problem: Pain Managment: Goal: General experience of comfort will improve Outcome: Progressing   Problem: Safety: Goal: Ability to remain free from injury will improve Outcome: Progressing   Problem: Skin Integrity: Goal: Risk for impaired skin integrity will decrease Outcome: Progressing

## 2022-03-19 NOTE — ED Notes (Signed)
Help patient back in bed on the monitor with call bell in reach

## 2022-03-19 NOTE — Progress Notes (Addendum)
Rounding Note    Patient Name: Steven Guzman Date of Encounter: 03/19/2022  [place] Cardiologist: Sanda Klein, MD   Subjective   No chest pain.  Bothered more by headache and generalized discomfort from laying on the ER stretcher.  Inpatient Medications    Scheduled Meds:  amiodarone  200 mg Oral Daily   aspirin EC  81 mg Oral Daily   carvedilol  3.125 mg Oral BID   mexiletine  200 mg Oral BID   rosuvastatin  40 mg Oral Daily   Continuous Infusions:  sodium chloride 50 mL/hr at 03/19/22 0407   heparin 1,750 Units/hr (03/19/22 0942)   nitroGLYCERIN 10 mcg/min (03/19/22 0111)   PRN Meds: acetaminophen, ondansetron (ZOFRAN) IV, traMADol   Vital Signs    Vitals:   03/19/22 0830 03/19/22 0900 03/19/22 0908 03/19/22 0930  BP: (!) 155/90 (!) 156/95 (!) 156/95 (!) 149/93  Pulse: 76 73 75 79  Resp: (!) 22 (!) 22  (!) 21  Temp:      TempSrc:      SpO2: 95% 96%  96%  Weight:        Intake/Output Summary (Last 24 hours) at 03/19/2022 1003 Last data filed at 03/18/2022 1929 Gross per 24 hour  Intake --  Output 150 ml  Net -150 ml      03/18/2022    4:49 AM 03/13/2022    4:00 PM 03/12/2022    1:14 PM  Last 3 Weights  Weight (lbs) 192 lb 192 lb 192 lb  Weight (kg) 87.091 kg 87.091 kg 87.091 kg      Telemetry    Normal sinus rhythm- Personally Reviewed  ECG    From yesterday afternoon, normal sinus rhythm, no return of the ST changes that he had on his initial ECG  Physical Exam   GEN: No acute distress.   Neck: No JVD Cardiac: RRR, no murmurs, rubs, or gallops.  Respiratory: Clear to auscultation bilaterally. GI: Soft, nontender, non-distended  MS: No edema; No deformity.  Discoloration of legs, 2+ right radial pulse Neuro:  Nonfocal  Psych: Normal affect   Labs    High Sensitivity Troponin:   Recent Labs  Lab 02/25/22 1435 02/25/22 1741 03/18/22 0438 03/18/22 0708  TROPONINIHS 8 8 69* 497*     Chemistry Recent Labs   Lab 03/18/22 0438 03/19/22 0205  NA 138 133*  K 3.8 3.8  CL 105 104  CO2 25 22  GLUCOSE 104* 153*  BUN 11 11  CREATININE 0.88 0.85  CALCIUM 9.2 8.6*  PROT 6.2*  --   ALBUMIN 3.3*  --   AST 24  --   ALT 18  --   ALKPHOS 51  --   BILITOT 0.6  --   GFRNONAA >60 >60  ANIONGAP 8 7    Lipids  Recent Labs  Lab 03/19/22 0205  CHOL 162  TRIG 75  HDL 44  LDLCALC 103*  CHOLHDL 3.7    Hematology Recent Labs  Lab 03/18/22 0438 03/19/22 0205  WBC 3.0* 3.6*  RBC 3.10* 2.94*  HGB 10.9* 10.0*  HCT 33.4* 31.8*  MCV 107.7* 108.2*  MCH 35.2* 34.0  MCHC 32.6 31.4  RDW 17.8* 17.7*  PLT 186 170   Thyroid No results for input(s): "TSH", "FREET4" in the last 168 hours.  BNP Recent Labs  Lab 03/18/22 0545  BNP 90.1    DDimer No results for input(s): "DDIMER" in the last 168 hours.   Radiology  DG Chest 2 View  Result Date: 03/18/2022 CLINICAL DATA:  Chest pain EXAM: CHEST - 2 VIEW COMPARISON:  02/25/2022 FINDINGS: Normal heart size and stable aortic tortuosity. Sizable hiatal hernia. Dual-chamber ICD leads from the left. There is no edema, consolidation, effusion, or pneumothorax. Artifact from EKG leads. IMPRESSION: Stable exam.  No acute finding. Electronically Signed   By: Jorje Guild M.D.   On: 03/18/2022 05:45    Cardiac Studies   EF 40 to 45% in January 2023  Patient Profile     81 y.o. male with NSTEMI  Assessment & Plan    CAD: Prior PCI.  Transient inferolateral ST elevation noted yesterday.  Cath delayed until today due to patient being on Eliquis and his symptoms resolving.  Last dose of Eliquis was on 12:19 PM.  AICD: Generator change out scheduled for end of January.  High likelihood that he will be on clopidogrel.  PAF: Resume Eliquis depending on cath results.  Amiodarone to maintain sinus rhythm.  Hypertension: Blood pressure has been well-controlled. Hyperlipidemia: Continue rosuvastatin 40 mg daily.  For questions or updates, please  contact [place] Please consult [email or web address] for contact info under        Signed, Larae Grooms, MD  03/19/2022, 10:03 AM

## 2022-03-19 NOTE — Progress Notes (Signed)
ANTICOAGULATION CONSULT NOTE - Follow Up Consult  Pharmacy Consult for heparin Indication:  NSTEMI/Afib  Labs: Recent Labs    03/18/22 0438 03/18/22 0708 03/18/22 1641 03/18/22 2224  HGB 10.9*  --   --   --   HCT 33.4*  --   --   --   PLT 186  --   --   --   APTT  --   --   --  36  HEPARINUNFRC  --   --  0.69  --   CREATININE 0.88  --   --   --   TROPONINIHS 69* 497*  --   --     Assessment: 81yo male subtherapeutic on heparin with initial dosing while Eliquis on hold; no infusion issues or signs of bleeding per RN.  Goal of Therapy:  aPTT 66-102 seconds   Plan:  Will increase heparin infusion by 4 units/kg/hr to 1600 units/hr and check level in 6 hours.    Wynona Neat, PharmD, BCPS  03/19/2022,12:42 AM

## 2022-03-19 NOTE — ED Notes (Signed)
Got patient back in bed patient is resting with call bell in reach and family at bedside

## 2022-03-19 NOTE — H&P (View-Only) (Signed)
Rounding Note    Patient Name: Steven Guzman Date of Encounter: 03/19/2022  [place] Cardiologist: Sanda Klein, MD   Subjective   No chest pain.  Bothered more by headache and generalized discomfort from laying on the ER stretcher.  Inpatient Medications    Scheduled Meds:  amiodarone  200 mg Oral Daily   aspirin EC  81 mg Oral Daily   carvedilol  3.125 mg Oral BID   mexiletine  200 mg Oral BID   rosuvastatin  40 mg Oral Daily   Continuous Infusions:  sodium chloride 50 mL/hr at 03/19/22 0407   heparin 1,750 Units/hr (03/19/22 0942)   nitroGLYCERIN 10 mcg/min (03/19/22 0111)   PRN Meds: acetaminophen, ondansetron (ZOFRAN) IV, traMADol   Vital Signs    Vitals:   03/19/22 0830 03/19/22 0900 03/19/22 0908 03/19/22 0930  BP: (!) 155/90 (!) 156/95 (!) 156/95 (!) 149/93  Pulse: 76 73 75 79  Resp: (!) 22 (!) 22  (!) 21  Temp:      TempSrc:      SpO2: 95% 96%  96%  Weight:        Intake/Output Summary (Last 24 hours) at 03/19/2022 1003 Last data filed at 03/18/2022 1929 Gross per 24 hour  Intake --  Output 150 ml  Net -150 ml      03/18/2022    4:49 AM 03/13/2022    4:00 PM 03/12/2022    1:14 PM  Last 3 Weights  Weight (lbs) 192 lb 192 lb 192 lb  Weight (kg) 87.091 kg 87.091 kg 87.091 kg      Telemetry    Normal sinus rhythm- Personally Reviewed  ECG    From yesterday afternoon, normal sinus rhythm, no return of the ST changes that he had on his initial ECG  Physical Exam   GEN: No acute distress.   Neck: No JVD Cardiac: RRR, no murmurs, rubs, or gallops.  Respiratory: Clear to auscultation bilaterally. GI: Soft, nontender, non-distended  MS: No edema; No deformity.  Discoloration of legs, 2+ right radial pulse Neuro:  Nonfocal  Psych: Normal affect   Labs    High Sensitivity Troponin:   Recent Labs  Lab 02/25/22 1435 02/25/22 1741 03/18/22 0438 03/18/22 0708  TROPONINIHS 8 8 69* 497*     Chemistry Recent Labs   Lab 03/18/22 0438 03/19/22 0205  NA 138 133*  K 3.8 3.8  CL 105 104  CO2 25 22  GLUCOSE 104* 153*  BUN 11 11  CREATININE 0.88 0.85  CALCIUM 9.2 8.6*  PROT 6.2*  --   ALBUMIN 3.3*  --   AST 24  --   ALT 18  --   ALKPHOS 51  --   BILITOT 0.6  --   GFRNONAA >60 >60  ANIONGAP 8 7    Lipids  Recent Labs  Lab 03/19/22 0205  CHOL 162  TRIG 75  HDL 44  LDLCALC 103*  CHOLHDL 3.7    Hematology Recent Labs  Lab 03/18/22 0438 03/19/22 0205  WBC 3.0* 3.6*  RBC 3.10* 2.94*  HGB 10.9* 10.0*  HCT 33.4* 31.8*  MCV 107.7* 108.2*  MCH 35.2* 34.0  MCHC 32.6 31.4  RDW 17.8* 17.7*  PLT 186 170   Thyroid No results for input(s): "TSH", "FREET4" in the last 168 hours.  BNP Recent Labs  Lab 03/18/22 0545  BNP 90.1    DDimer No results for input(s): "DDIMER" in the last 168 hours.   Radiology  DG Chest 2 View  Result Date: 03/18/2022 CLINICAL DATA:  Chest pain EXAM: CHEST - 2 VIEW COMPARISON:  02/25/2022 FINDINGS: Normal heart size and stable aortic tortuosity. Sizable hiatal hernia. Dual-chamber ICD leads from the left. There is no edema, consolidation, effusion, or pneumothorax. Artifact from EKG leads. IMPRESSION: Stable exam.  No acute finding. Electronically Signed   By: Jorje Guild M.D.   On: 03/18/2022 05:45    Cardiac Studies   EF 40 to 45% in January 2023  Patient Profile     81 y.o. male with NSTEMI  Assessment & Plan    CAD: Prior PCI.  Transient inferolateral ST elevation noted yesterday.  Cath delayed until today due to patient being on Eliquis and his symptoms resolving.  Last dose of Eliquis was on 12:19 PM.  AICD: Generator change out scheduled for end of January.  High likelihood that he will be on clopidogrel.  PAF: Resume Eliquis depending on cath results.  Amiodarone to maintain sinus rhythm.  Hypertension: Blood pressure has been well-controlled. Hyperlipidemia: Continue rosuvastatin 40 mg daily.  For questions or updates, please  contact [place] Please consult [email or web address] for contact info under        Signed, Larae Grooms, MD  03/19/2022, 10:03 AM

## 2022-03-19 NOTE — ED Notes (Signed)
Helped patient up to the bedside toilet patient has call bell in reach

## 2022-03-19 NOTE — ED Notes (Signed)
Pt reporting intermittent headache all night.

## 2022-03-19 NOTE — Progress Notes (Signed)
*  PRELIMINARY RESULTS* Echocardiogram 2D Echocardiogram has been performed.  Samuel Germany 03/19/2022, 5:28 PM

## 2022-03-19 NOTE — Telephone Encounter (Signed)
Dr. Tamala Julian responded to wife via MyChart message:  I reviewed the data and picture. No severe or critical findings.

## 2022-03-20 ENCOUNTER — Other Ambulatory Visit (HOSPITAL_COMMUNITY): Payer: Self-pay

## 2022-03-20 ENCOUNTER — Encounter (HOSPITAL_COMMUNITY): Payer: Self-pay | Admitting: Cardiology

## 2022-03-20 DIAGNOSIS — I5020 Unspecified systolic (congestive) heart failure: Secondary | ICD-10-CM | POA: Diagnosis not present

## 2022-03-20 DIAGNOSIS — Z7901 Long term (current) use of anticoagulants: Secondary | ICD-10-CM | POA: Diagnosis not present

## 2022-03-20 DIAGNOSIS — I214 Non-ST elevation (NSTEMI) myocardial infarction: Secondary | ICD-10-CM | POA: Diagnosis not present

## 2022-03-20 LAB — CBC
HCT: 31.4 % — ABNORMAL LOW (ref 39.0–52.0)
Hemoglobin: 10.4 g/dL — ABNORMAL LOW (ref 13.0–17.0)
MCH: 34.9 pg — ABNORMAL HIGH (ref 26.0–34.0)
MCHC: 33.1 g/dL (ref 30.0–36.0)
MCV: 105.4 fL — ABNORMAL HIGH (ref 80.0–100.0)
Platelets: 164 10*3/uL (ref 150–400)
RBC: 2.98 MIL/uL — ABNORMAL LOW (ref 4.22–5.81)
RDW: 17.6 % — ABNORMAL HIGH (ref 11.5–15.5)
WBC: 3.8 10*3/uL — ABNORMAL LOW (ref 4.0–10.5)
nRBC: 0 % (ref 0.0–0.2)

## 2022-03-20 LAB — BASIC METABOLIC PANEL
Anion gap: 7 (ref 5–15)
BUN: 6 mg/dL — ABNORMAL LOW (ref 8–23)
CO2: 22 mmol/L (ref 22–32)
Calcium: 8.8 mg/dL — ABNORMAL LOW (ref 8.9–10.3)
Chloride: 105 mmol/L (ref 98–111)
Creatinine, Ser: 0.69 mg/dL (ref 0.61–1.24)
GFR, Estimated: >60 mL/min (ref >60)
Glucose, Bld: 134 mg/dL — ABNORMAL HIGH (ref 70–99)
Potassium: 3.2 mmol/L — ABNORMAL LOW (ref 3.5–5.1)
Sodium: 134 mmol/L — ABNORMAL LOW (ref 135–145)

## 2022-03-20 LAB — HEMOGLOBIN A1C
Hgb A1c MFr Bld: 5.3 % (ref 4.8–5.6)
Mean Plasma Glucose: 105 mg/dL

## 2022-03-20 LAB — LIPOPROTEIN A (LPA): Lipoprotein (a): 10.6 nmol/L (ref <75.0)

## 2022-03-20 MED ORDER — EMPAGLIFLOZIN 10 MG PO TABS
10.0000 mg | ORAL_TABLET | Freq: Every day | ORAL | 11 refills | Status: DC
  Filled 2022-03-20: qty 30, 30d supply, fill #0
  Filled 2022-05-01: qty 30, 30d supply, fill #1

## 2022-03-20 MED ORDER — EMPAGLIFLOZIN 10 MG PO TABS
10.0000 mg | ORAL_TABLET | Freq: Every day | ORAL | Status: DC
  Administered 2022-03-20: 10 mg via ORAL
  Filled 2022-03-20: qty 1

## 2022-03-20 MED ORDER — ASPIRIN 81 MG PO TBEC: 81.0000 mg | DELAYED_RELEASE_TABLET | Freq: Every day | ORAL | Status: AC

## 2022-03-20 MED ORDER — POTASSIUM CHLORIDE CRYS ER 20 MEQ PO TBCR
40.0000 meq | EXTENDED_RELEASE_TABLET | Freq: Once | ORAL | Status: AC
  Administered 2022-03-20: 40 meq via ORAL
  Filled 2022-03-20: qty 2

## 2022-03-20 MED ORDER — SACUBITRIL-VALSARTAN 24-26 MG PO TABS
1.0000 | ORAL_TABLET | Freq: Two times a day (BID) | ORAL | 11 refills | Status: DC
  Filled 2022-03-20: qty 60, 30d supply, fill #0

## 2022-03-20 MED ORDER — FUROSEMIDE 20 MG PO TABS
20.0000 mg | ORAL_TABLET | Freq: Every day | ORAL | 2 refills | Status: DC | PRN
  Filled 2022-03-20: qty 30, 30d supply, fill #0

## 2022-03-20 MED ORDER — SACUBITRIL-VALSARTAN 24-26 MG PO TABS
1.0000 | ORAL_TABLET | Freq: Two times a day (BID) | ORAL | Status: DC
  Administered 2022-03-20: 1 via ORAL
  Filled 2022-03-20: qty 1

## 2022-03-20 NOTE — Progress Notes (Addendum)
Rounding Note    Patient Name: Steven Guzman Date of Encounter: 03/20/2022  [place] Cardiologist: Sanda Klein, MD   Subjective   Denies any CP or SOB.   Inpatient Medications    Scheduled Meds:  amiodarone  200 mg Oral Daily   apixaban  5 mg Oral BID   aspirin EC  81 mg Oral Daily   carvedilol  3.125 mg Oral BID   mexiletine  200 mg Oral BID   rosuvastatin  40 mg Oral Daily   sodium chloride flush  3 mL Intravenous Q12H   Continuous Infusions:  sodium chloride     PRN Meds: sodium chloride, acetaminophen, ondansetron (ZOFRAN) IV, sodium chloride flush, traMADol   Vital Signs    Vitals:   03/19/22 2114 03/19/22 2117 03/20/22 0044 03/20/22 0407  BP:  139/88 120/75 123/76  Pulse: 79 72 69 65  Resp:  '16 19 20  '$ Temp:  98.1 F (36.7 C) 97.6 F (36.4 C) 98 F (36.7 C)  TempSrc:  Oral Oral Oral  SpO2: 92% 94% 94% (!) 89%  Weight:      Height:        Intake/Output Summary (Last 24 hours) at 03/20/2022 0804 Last data filed at 03/20/2022 0414 Gross per 24 hour  Intake 902.64 ml  Output 1500 ml  Net -597.36 ml      03/19/2022    2:13 PM 03/18/2022    4:49 AM 03/13/2022    4:00 PM  Last 3 Weights  Weight (lbs) 192 lb 3.9 oz 192 lb 192 lb  Weight (kg) 87.2 kg 87.091 kg 87.091 kg      Telemetry    NSR without significant ventricular ectopy - Personally Reviewed  ECG    NSR without conduction delay and BBB - Personally Reviewed  Physical Exam   GEN: No acute distress.   Neck: No JVD Cardiac: RRR, no murmurs, rubs, or gallops.  Respiratory: Clear to auscultation bilaterally. GI: Soft, nontender, non-distended  MS: No edema; No deformity. Neuro:  Nonfocal  Psych: Normal affect   Labs    High Sensitivity Troponin:   Recent Labs  Lab 02/25/22 1435 02/25/22 1741 03/18/22 0438 03/18/22 0708  TROPONINIHS 8 8 69* 497*     Chemistry Recent Labs  Lab 03/18/22 0438 03/19/22 0205  NA 138 133*  K 3.8 3.8  CL 105 104  CO2  25 22  GLUCOSE 104* 153*  BUN 11 11  CREATININE 0.88 0.85  CALCIUM 9.2 8.6*  PROT 6.2*  --   ALBUMIN 3.3*  --   AST 24  --   ALT 18  --   ALKPHOS 51  --   BILITOT 0.6  --   GFRNONAA >60 >60  ANIONGAP 8 7    Lipids  Recent Labs  Lab 03/19/22 0205  CHOL 162  TRIG 75  HDL 44  LDLCALC 103*  CHOLHDL 3.7    Hematology Recent Labs  Lab 03/18/22 0438 03/19/22 0205 03/20/22 0159  WBC 3.0* 3.6* 3.8*  RBC 3.10* 2.94* 2.98*  HGB 10.9* 10.0* 10.4*  HCT 33.4* 31.8* 31.4*  MCV 107.7* 108.2* 105.4*  MCH 35.2* 34.0 34.9*  MCHC 32.6 31.4 33.1  RDW 17.8* 17.7* 17.6*  PLT 186 170 164   Thyroid No results for input(s): "TSH", "FREET4" in the last 168 hours.  BNP Recent Labs  Lab 03/18/22 0545  BNP 90.1    DDimer No results for input(s): "DDIMER" in the last 168 hours.   Radiology  ECHOCARDIOGRAM COMPLETE  Result Date: 03/19/2022    ECHOCARDIOGRAM REPORT   Patient Name:   Steven Guzman Date of Exam: 03/19/2022 Medical Rec #:  [id number]        Height:       72.0 in Accession #:    [id number]       Weight:       192.2 lb Date of Birth:  [birth year]        BSA:          2.095 m Patient Age:    81 years         BP:           139/100 mmHg Patient Gender: M                HR:           72 bpm. Exam Location:  Inpatient Procedure: 2D Echo, Cardiac Doppler and Color Doppler Indications:    R07.9 Chest Pain  History:        Patient has prior history of Echocardiogram examinations, most                 recent 04/16/2021. CHF, Previous Myocardial Infarction and CAD,                 COPD; Risk Factors:Hypertension and Dyslipidemia. ICD                 (implantable cardioverter-defibrillator) in place, Tobacco                 abuse.  Sonographer:    Alvino Chapel RCS Referring Phys: [id number] PETER M [place] IMPRESSIONS  1. Left ventricular ejection fraction, by estimation, is 25 to 30%. The left ventricle has severely decreased function. The left ventricle demonstrates regional wall motion  abnormalities with inferior akinesis, basal to mid inferolateral akinesis, anterolateral severe hypokinesis. The left ventricular internal cavity size was mildly dilated. There is mild concentric left ventricular hypertrophy. Left ventricular diastolic parameters are consistent with Grade I diastolic dysfunction (impaired relaxation).  2. Right ventricular systolic function is normal. The right ventricular size is normal. There is normal pulmonary artery systolic pressure. The estimated right ventricular systolic pressure is 02.5 mmHg.  3. Left atrial size was mildly dilated.  4. Right atrial size was mildly dilated.  5. The mitral valve is abnormal. Mild to moderate mitral valve regurgitation. No evidence of mitral stenosis.  6. The aortic valve is tricuspid. There is mild calcification of the aortic valve. Aortic valve regurgitation is mild. No aortic stenosis is present.  7. Aortic dilatation noted. There is mild dilatation of the aortic root, measuring 39 mm.  8. The inferior vena cava is dilated in size with >50% respiratory variability, suggesting right atrial pressure of 8 mmHg. FINDINGS  Left Ventricle: Left ventricular ejection fraction, by estimation, is 25 to 30%. The left ventricle has severely decreased function. The left ventricle demonstrates regional wall motion abnormalities. The left ventricular internal cavity size was mildly  dilated. There is mild concentric left ventricular hypertrophy. Left ventricular diastolic parameters are consistent with Grade I diastolic dysfunction (impaired relaxation). Right Ventricle: The right ventricular size is normal. No increase in right ventricular wall thickness. Right ventricular systolic function is normal. There is normal pulmonary artery systolic pressure. The tricuspid regurgitant velocity is 2.28 m/s, and  with an assumed right atrial pressure of 8 mmHg, the estimated right ventricular systolic pressure is 42.7 mmHg. Left Atrium: Left atrial size was  mildly  dilated. Right Atrium: Right atrial size was mildly dilated. Pericardium: There is no evidence of pericardial effusion. Mitral Valve: The mitral valve is abnormal. Mild to moderate mitral valve regurgitation. No evidence of mitral valve stenosis. Tricuspid Valve: The tricuspid valve is normal in structure. Tricuspid valve regurgitation is trivial. Aortic Valve: The aortic valve is tricuspid. There is mild calcification of the aortic valve. Aortic valve regurgitation is mild. No aortic stenosis is present. Aortic valve mean gradient measures 6.0 mmHg. Aortic valve peak gradient measures 12.5 mmHg. Aortic valve area, by VTI measures 2.35 cm. Pulmonic Valve: The pulmonic valve was normal in structure. Pulmonic valve regurgitation is trivial. Aorta: Aortic dilatation noted. There is mild dilatation of the aortic root, measuring 39 mm. Venous: The inferior vena cava is dilated in size with greater than 50% respiratory variability, suggesting right atrial pressure of 8 mmHg. IAS/Shunts: No atrial level shunt detected by color flow Doppler. Additional Comments: A device lead is visualized in the right ventricle.  LEFT VENTRICLE PLAX 2D LVIDd:         6.00 cm      Diastology LVIDs:         5.00 cm      LV e' medial:    3.70 cm/s LV PW:         1.20 cm      LV E/e' medial:  11.4 LV IVS:        1.40 cm      LV e' lateral:   3.59 cm/s LVOT diam:     2.20 cm      LV E/e' lateral: 11.7 LV SV:         84 LV SV Index:   40 LVOT Area:     3.80 cm  LV Volumes (MOD) LV vol d, MOD A2C: 214.0 ml LV vol d, MOD A4C: 242.0 ml LV vol s, MOD A2C: 172.0 ml LV vol s, MOD A4C: 166.0 ml LV SV MOD A2C:     42.0 ml LV SV MOD A4C:     242.0 ml LV SV MOD BP:      57.0 ml RIGHT VENTRICLE RV S prime:     12.00 cm/s TAPSE (M-mode): 2.1 cm LEFT ATRIUM             Index        RIGHT ATRIUM           Index LA diam:        3.20 cm 1.53 cm/m   RA Area:     26.70 cm LA Vol (A2C):   70.0 ml 33.41 ml/m  RA Volume:   90.50 ml  43.20 ml/m LA Vol  (A4C):   60.9 ml 29.07 ml/m LA Biplane Vol: 65.9 ml 31.46 ml/m  AORTIC VALVE AV Area (Vmax):    1.94 cm AV Area (Vmean):   1.97 cm AV Area (VTI):     2.35 cm AV Vmax:           177.00 cm/s AV Vmean:          108.000 cm/s AV VTI:            0.357 m AV Peak Grad:      12.5 mmHg AV Mean Grad:      6.0 mmHg LVOT Vmax:         90.10 cm/s LVOT Vmean:        56.100 cm/s LVOT VTI:          0.221 m LVOT/AV  VTI ratio: 0.62  AORTA Ao Root diam: 3.90 cm MITRAL VALVE                  TRICUSPID VALVE MV Area (PHT): 3.34 cm       TR Peak grad:   20.8 mmHg MV Decel Time: 227 msec       TR Vmax:        228.00 cm/s MR Peak grad:    136.9 mmHg MR Mean grad:    75.0 mmHg    SHUNTS MR Vmax:         585.00 cm/s  Systemic VTI:  0.22 m MR Vmean:        399.0 cm/s   Systemic Diam: 2.20 cm MR PISA:         2.26 cm MR PISA Eff ROA: 9 mm MR PISA Radius:  0.60 cm MV E velocity: 42.00 cm/s MV A velocity: 101.00 cm/s MV E/A ratio:  0.42 Dalton McleanMD Electronically signed by Franki Monte Signature Date/Time: 03/19/2022/6:18:40 PM    Final    CARDIAC CATHETERIZATION  Result Date: 03/19/2022   Mid LM to Dist LM lesion is 20% stenosed.   Ost LAD to Prox LAD lesion is 25% stenosed.   Prox LAD to Mid LAD lesion is 35% stenosed.   3rd Mrg lesion is 100% stenosed.   Prox RCA to Mid RCA lesion is 40% stenosed.   RPDA lesion is 80% stenosed.   There is mild left ventricular systolic dysfunction.   LV end diastolic pressure is mildly elevated.   The left ventricular ejection fraction is 45-50% by visual estimate. 2 vessel obstructive CAD - 100% occlusion of distal OM3 appears to be the culprit. The ostial PDA lesion is unchanged from 2014 Patent stents in the LAD and RCA Mild LV dysfunction with inferior wall motion abnormality Mildly elevated LVEDP 16 mm Hg Plan: recommend medical therapy   Cardiac Studies   Echo 03/19/2022  1. Left ventricular ejection fraction, by estimation, is 25 to 30%. The  left ventricle has severely  decreased function. The left ventricle  demonstrates regional wall motion abnormalities with inferior akinesis,  basal to mid inferolateral akinesis,  anterolateral severe hypokinesis. The left ventricular internal cavity  size was mildly dilated. There is mild concentric left ventricular  hypertrophy. Left ventricular diastolic parameters are consistent with  Grade I diastolic dysfunction (impaired  relaxation).   2. Right ventricular systolic function is normal. The right ventricular  size is normal. There is normal pulmonary artery systolic pressure. The  estimated right ventricular systolic pressure is 00.7 mmHg.   3. Left atrial size was mildly dilated.   4. Right atrial size was mildly dilated.   5. The mitral valve is abnormal. Mild to moderate mitral valve  regurgitation. No evidence of mitral stenosis.   6. The aortic valve is tricuspid. There is mild calcification of the  aortic valve. Aortic valve regurgitation is mild. No aortic stenosis is  present.   7. Aortic dilatation noted. There is mild dilatation of the aortic root,  measuring 39 mm.   8. The inferior vena cava is dilated in size with >50% respiratory  variability, suggesting right atrial pressure of 8 mmHg.    Cath 03/19/2022   Mid LM to Dist LM lesion is 20% stenosed.   Ost LAD to Prox LAD lesion is 25% stenosed.   Prox LAD to Mid LAD lesion is 35% stenosed.   3rd Mrg lesion is 100% stenosed.   Prox RCA  to Mid RCA lesion is 40% stenosed.   RPDA lesion is 80% stenosed.   There is mild left ventricular systolic dysfunction.   LV end diastolic pressure is mildly elevated.   The left ventricular ejection fraction is 45-50% by visual estimate.   2 vessel obstructive CAD - 100% occlusion of distal OM3 appears to be the culprit. The ostial PDA lesion is unchanged from 2014 Patent stents in the LAD and RCA Mild LV dysfunction with inferior wall motion abnormality Mildly elevated LVEDP 16 mm Hg   Plan: recommend  medical therapy     Patient Profile     81 y.o. male with a hx of CAD status post inferior MI/cardiac arrest '95, MI treated with PCI of LAD/RCA '11, ICM s/p AICD implant '10, VT s/p ablation '16, paroxysmal atrial fibrillation on Eliquis, AAA, hypertension, hyperlipidemia, CKD stage III who is being admitted with NSTEMI.   Assessment & Plan    NSTEMI/CAD  - Serial trop 69-->497 - Echo EF 25-30% down from the previous 40-45% on echo in Jan 2023  - Cath 03/19/2022 20% LM, 25% ost LAD, 35% prox to mid LAD, 100% OM3, 40% prox to mid RCA, 80% RPDA. EF by LV gram 45-50%. Patent stents in LAD and RCA. The 100% occlusion of distal OM3 appears to be the culprit. Ost PDA lesion unchanged from 2014. Recommend medical therapy  Worsening LV dysfunction  - Echo 04/16/2021 EF 40-45%, grade 1 DD, trivial MR  - Echo 03/19/2022 EF 25-30%, inferior akinesis, basal to mid inferolateral akinesis, anterolateral severe hypokinesis, grade 1 DD, mild to moderate MR  - titrate GDMT, continue coreg, has not received home benazepril during this hospital, given enough washout from benazepril, will started on low dose 24-'26mg'$  BID. Start [place]. Change home lasix '20mg'$  to PRN given addition of entresto and Jardiance. Likely can be discharged today. At patient request, his cardiologist has been switched to Dr. Sallyanne Kuster as Dr. Tamala Julian is going into semi-retirement state.    AICD: generator change out scheduled for Jan  VT s/p ablation  PAF: maintaining NSR on amiodarone. Continue Eliquis, does have significant bruising for years.   HTN  HLD: on crestor 40 mg daily   For questions or updates, please contact Nathalie Please consult [email or web address] for contact info under        Signed, Almyra Deforest, [place]  03/20/2022, 8:04 AM    I have examined the patient and reviewed assessment and plan and discussed with patient.  Agree with above as stated.    Plan for discharge later today.  Medical therapy of his  coronary artery disease.  Trying to uptitrate medications for CHF.  LVEF found to be severely decreased.  Aggressive medical therapy for CAD as well.  Restart anticoagulation today.  Plan for discharge later today.  Larae Grooms

## 2022-03-20 NOTE — Discharge Summary (Addendum)
Discharge Summary    Patient ID: Steven Guzman MRN: [id number]; DOB: 01/12/1941  Admit date: 03/18/2022 Discharge date: 03/20/2022  PCP:  Steven Alanis, NP   [place] Prov[facility] Cardiologist:  Steven Klein, MD  Electrophysiologist:  Steven Peru, MD       Discharge Diagnoses    Principal Problem:   NSTEMI (non-ST elevated myocardial infarction) [facility]) Active Problems:   CAD (coronary artery disease)   Ventricular tachycardia [facility])   Essential hypertension   Hyperlipidemia   ICD (implantable cardioverter-defibrillator) in place   Chronic systolic heart failure [facility])   Atrial fibrillation ([place])    Diagnostic Studies/Procedures    Echo 03/19/2022  1. Left ventricular ejection fraction, by estimation, is 25 to 30%. The  left ventricle has severely decreased function. The left ventricle  demonstrates regional wall motion abnormalities with inferior akinesis,  basal to mid inferolateral akinesis,  anterolateral severe hypokinesis. The left ventricular internal cavity  size was mildly dilated. There is mild concentric left ventricular  hypertrophy. Left ventricular diastolic parameters are consistent with  Grade I diastolic dysfunction (impaired  relaxation).   2. Right ventricular systolic function is normal. The right ventricular  size is normal. There is normal pulmonary artery systolic pressure. The  estimated right ventricular systolic pressure is 78.2 mmHg.   3. Left atrial size was mildly dilated.   4. Right atrial size was mildly dilated.   5. The mitral valve is abnormal. Mild to moderate mitral valve  regurgitation. No evidence of mitral stenosis.   6. The aortic valve is tricuspid. There is mild calcification of the  aortic valve. Aortic valve regurgitation is mild. No aortic stenosis is  present.   7. Aortic dilatation noted. There is mild dilatation of the aortic root,  measuring 39 mm.   8. The inferior vena cava is dilated in size with  >50% respiratory  variability, suggesting right atrial pressure of 8 mmHg.      Cath 03/19/2022   Mid LM to Dist LM lesion is 20% stenosed.   Ost LAD to Prox LAD lesion is 25% stenosed.   Prox LAD to Mid LAD lesion is 35% stenosed.   3rd Mrg lesion is 100% stenosed.   Prox RCA to Mid RCA lesion is 40% stenosed.   RPDA lesion is 80% stenosed.   There is mild left ventricular systolic dysfunction.   LV end diastolic pressure is mildly elevated.   The left ventricular ejection fraction is 45-50% by visual estimate.   2 vessel obstructive CAD - 100% occlusion of distal OM3 appears to be the culprit. The ostial PDA lesion is unchanged from 2014 Patent stents in the LAD and RCA Mild LV dysfunction with inferior wall motion abnormality Mildly elevated LVEDP 16 mm Hg   Plan: recommend medical therapy   _____________   History of Present Illness     Steven Guzman is a 81 y.o. male with a hx of CAD status post inferior MI/cardiac arrest '95, MI treated with PCI of LAD/RCA '11, ICM s/p AICD implant '10, VT s/p ablation '16, paroxysmal atrial fibrillation on Eliquis, AAA, hypertension, hyperlipidemia, CKD stage III who is being seen 03/18/2022 for the evaluation of chest pain at the request of Steven Guzman.   Steven Guzman is an 81 year old male with past medical history noted above.  He has been followed by Dr. Tamala Julian as well as Steven Guzman.  Last cardiac catheterization 10/2012 showing patent LAD and RCA stent with 80 to 90% PDA, 50%  proximal circumflex after first OM medically treated.  Echocardiogram 03/2021 showed LVEF of 40 to 45%, mildly decreased LV function, no regional wall motion abnormality, inferior/lateral akinesis, anterior lateral hypokinesis with mildly dilated LV, grade 1 diastolic dysfunction with normal RV size and function, moderately dilated LA, mildly dilated RA.  Surveillance CT chest for AAA 04/2021 showing 4.3 cm dilation. Last seen in the office with Steven Guzman on 11/2021.  At this  visit he reported experiencing right shoulder and back pain which she reports would wake him from sleep.  Denied any chest pain or angina with activities.  Reported compliance with his home medications.  Continued to smoke.   Underwent right hemiarthroplasty 11/11 and underwent rehab at [facility].   Most recently seen in the office with Steven Guzman on 03/13/2022 and noted no ICD shocks.  No syncope.  He was continued on amiodarone 200 mg daily, Eliquis 5 mg twice daily, Coreg 3.125 mg twice daily, mexiletine 200 mg twice daily.  His ICD had reached ERI with plans to undergo a GEN change in the coming weeks.  Interrogation reported occasional episodes of slow VT.  He was also noted to be maintaining sinus rhythm.   Presented to the ED 12/20 with complaints of chest pain that began shortly after he woke up.  States he does not feel that the chest pain woke him from sleep.  Episode radiated across his chest.  He took 2 sublingual nitroglycerin without significant relief.   Labs in the ED showed sodium 138, potassium 3.8, creatinine 0.8, BNP 90, high-sensitivity troponin 69, WBC 3, hemoglobin 10.9.  Chest x-ray with no acute finding.  EKG shows sinus rhythm, 68 bpm, prolonged QT, IVCD.  Patient was given GI cocktail in the ED with some improvement of symptoms but still with ongoing chest pressure.   In talking with patient reports he was running errands yesterday including going to the post office and grocery shopping.  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  Had no anginal symptoms during those activities during the day.  Hospital Course     Consultants: N/A   Patient was admitted to cardiology service.  Serial troponin trended up to 497.  Echocardiogram obtained on 03/19/2022 showed EF 25 to 30%, inferior akinesis, basal to mid inferolateral akinesis, anterolateral severe hypokinesis, grade 1 DD, mild to moderate MR.  When compared to the previous echocardiogram from January 2023, EF has  dropped from the previous 40 to 45% down to 25 to 30%.  He subsequently underwent cardiac catheterization on 03/19/2022 which revealed 20% LM, 25% ost LAD, 35% prox to mid LAD, 100% OM3, 40% prox to mid RCA, 80% RPDA. EF by LV gram 45-50%. Patent stents in LAD and RCA. The 100% occlusion of distal OM3 appears to be the culprit. Ost PDA lesion unchanged from 2014. Recommend medical therapy.  Postprocedure, aspirin was added to Eliquis.  Eliquis was resumed in the morning of 03/20/2022.  We did not start him on Plavix therapy to avoid excessive bleeding risk.  The drop in the ejection fraction appears to be out of proportion when compared to the lesion that was affected.  We recommend aggressive heart failure medication titration.  Hold benazepril has been stopped, he was instead started on 24-26 mg twice a day of Entresto.  He was also started on Jardiance during this hospitalization.  Since his primary cardiologist to Dr. Tamala Julian is going to send my retirement state, he has requested to change cardiologist to Dr. Sallyanne Kuster.  He was seen in the morning of 03/20/2022 at which time he was doing well without any chest pain or shortness of breath.  He is scheduled to undergo generator change out for his AICD in January.  He has chronic bruising in his upper and lower extremity from Eliquis but no active bleeding.  Potassium was low prior to discharge and this was repleted with potassium supplement 40 mEq.  I have scheduled the patient to follow-up with APP in our surgery office on 03/31/2022.  I tentatively scheduled him a appointment with Dr. Sallyanne Kuster in early April, however if needed, patient can be placed on Dr. Victorino December cancellation list in case someone else's appointment get cancelled prior to April.  Patient will need a basic metabolic panel on follow-up.  If blood pressure allows, will consider addition of spironolactone and follow-up as well.       Did the patient have an acute coronary syndrome (MI,  NSTEMI, STEMI, etc) this admission?:  Yes                               AHA/ACC Clinical Performance & Quality Measures: Aspirin prescribed? - Yes ADP Receptor Inhibitor (Plavix/Clopidogrel, Brilinta/Ticagrelor or Effient/Prasugrel) prescribed (includes medically managed patients)? - No - need Eliquis, no stent was placed Beta Blocker prescribed? - Yes High Intensity Statin (Lipitor 40-79m or Crestor 20-423m prescribed? - Yes EF assessed during THIS hospitalization? - Yes For EF <40%, was ACEI/ARB prescribed? - Yes For EF <40%, Aldosterone Antagonist (Spironolactone or Eplerenone) prescribed? - No - Reason:  reassess on follow up Cardiac Rehab Phase II ordered (including medically managed patients)? - Yes         _____________  Discharge Vitals Blood pressure 138/85, pulse 65, temperature 97.7 F (36.5 C), temperature source Oral, resp. rate 18, height 6' (1.829 m), weight 87.2 kg, SpO2 94 %.  Filed Weights   03/18/22 0449 03/19/22 1413  Weight: 87.1 kg 87.2 kg    Labs & Radiologic Studies    CBC Recent Labs    03/18/22 0438 03/19/22 0205 03/20/22 0159  WBC 3.0* 3.6* 3.8*  NEUTROABS 1.6*  --   --   HGB 10.9* 10.0* 10.4*  HCT 33.4* 31.8* 31.4*  MCV 107.7* 108.2* 105.4*  PLT 186 170 16470 Basic Metabolic Panel Recent Labs    03/19/22 0205 03/20/22 0948  NA 133* 134*  K 3.8 3.2*  CL 104 105  CO2 22 22  GLUCOSE 153* 134*  BUN 11 6*  CREATININE 0.85 0.69  CALCIUM 8.6* 8.8*   Liver Function Tests Recent Labs    03/18/22 0438  AST 24  ALT 18  ALKPHOS 51  BILITOT 0.6  PROT 6.2*  ALBUMIN 3.3*   No results for input(s): "LIPASE", "AMYLASE" in the last 72 hours. High Sensitivity Troponin:   Recent Labs  Lab 02/25/22 1435 02/25/22 1741 03/18/22 0438 03/18/22 0708  TROPONINIHS 8 8 69* 497*    BNP Invalid input(s): "POCBNP" D-Dimer No results for input(s): "DDIMER" in the last 72 hours. Hemoglobin A1C Recent Labs    03/19/22 0205  HGBA1C 5.3    Fasting Lipid Panel Recent Labs    03/19/22 0205  CHOL 162  HDL 44  LDLCALC 103*  TRIG 75  CHOLHDL 3.7   Thyroid Function Tests No results for input(s): "TSH", "T4TOTAL", "T3FREE", "THYROIDAB" in the last 72 hours.  Invalid input(s): "FREET3" _____________  ECHOCARDIOGRAM COMPLETE  Result Date: 03/19/2022  ECHOCARDIOGRAM REPORT   Patient Name:   DERIK FULTS Date of Exam: 03/19/2022 Medical Rec #:  [id number]        Height:       72.0 in Accession #:    [id number]       Weight:       192.2 lb Date of Birth:  May 20, 1940        BSA:          2.095 m Patient Age:    43 years         BP:           139/100 mmHg Patient Gender: M                HR:           72 bpm. Exam Location:  Inpatient Procedure: 2D Echo, Cardiac Doppler and Color Doppler Indications:    R07.9 Chest Pain  History:        Patient has prior history of Echocardiogram examinations, most                 recent 04/16/2021. CHF, Previous Myocardial Infarction and CAD,                 COPD; Risk Factors:Hypertension and Dyslipidemia. ICD                 (implantable cardioverter-defibrillator) in place, Tobacco                 abuse.  Sonographer:    Alvino Chapel RCS Referring Phys: [id number] PETER M [place] IMPRESSIONS  1. Left ventricular ejection fraction, by estimation, is 25 to 30%. The left ventricle has severely decreased function. The left ventricle demonstrates regional wall motion abnormalities with inferior akinesis, basal to mid inferolateral akinesis, anterolateral severe hypokinesis. The left ventricular internal cavity size was mildly dilated. There is mild concentric left ventricular hypertrophy. Left ventricular diastolic parameters are consistent with Grade I diastolic dysfunction (impaired relaxation).  2. Right ventricular systolic function is normal. The right ventricular size is normal. There is normal pulmonary artery systolic pressure. The estimated right ventricular systolic pressure is 93.7 mmHg.  3. Left  atrial size was mildly dilated.  4. Right atrial size was mildly dilated.  5. The mitral valve is abnormal. Mild to moderate mitral valve regurgitation. No evidence of mitral stenosis.  6. The aortic valve is tricuspid. There is mild calcification of the aortic valve. Aortic valve regurgitation is mild. No aortic stenosis is present.  7. Aortic dilatation noted. There is mild dilatation of the aortic root, measuring 39 mm.  8. The inferior vena cava is dilated in size with >50% respiratory variability, suggesting right atrial pressure of 8 mmHg. FINDINGS  Left Ventricle: Left ventricular ejection fraction, by estimation, is 25 to 30%. The left ventricle has severely decreased function. The left ventricle demonstrates regional wall motion abnormalities. The left ventricular internal cavity size was mildly  dilated. There is mild concentric left ventricular hypertrophy. Left ventricular diastolic parameters are consistent with Grade I diastolic dysfunction (impaired relaxation). Right Ventricle: The right ventricular size is normal. No increase in right ventricular wall thickness. Right ventricular systolic function is normal. There is normal pulmonary artery systolic pressure. The tricuspid regurgitant velocity is 2.28 m/s, and  with an assumed right atrial pressure of 8 mmHg, the estimated right ventricular systolic pressure is 34.2 mmHg. Left Atrium: Left atrial size was mildly dilated. Right Atrium: Right atrial size was mildly dilated. Pericardium:  There is no evidence of pericardial effusion. Mitral Valve: The mitral valve is abnormal. Mild to moderate mitral valve regurgitation. No evidence of mitral valve stenosis. Tricuspid Valve: The tricuspid valve is normal in structure. Tricuspid valve regurgitation is trivial. Aortic Valve: The aortic valve is tricuspid. There is mild calcification of the aortic valve. Aortic valve regurgitation is mild. No aortic stenosis is present. Aortic valve mean gradient measures  6.0 mmHg. Aortic valve peak gradient measures 12.5 mmHg. Aortic valve area, by VTI measures 2.35 cm. Pulmonic Valve: The pulmonic valve was normal in structure. Pulmonic valve regurgitation is trivial. Aorta: Aortic dilatation noted. There is mild dilatation of the aortic root, measuring 39 mm. Venous: The inferior vena cava is dilated in size with greater than 50% respiratory variability, suggesting right atrial pressure of 8 mmHg. IAS/Shunts: No atrial level shunt detected by color flow Doppler. Additional Comments: A device lead is visualized in the right ventricle.  LEFT VENTRICLE PLAX 2D LVIDd:         6.00 cm      Diastology LVIDs:         5.00 cm      LV e' medial:    3.70 cm/s LV PW:         1.20 cm      LV E/e' medial:  11.4 LV IVS:        1.40 cm      LV e' lateral:   3.59 cm/s LVOT diam:     2.20 cm      LV E/e' lateral: 11.7 LV SV:         84 LV SV Index:   40 LVOT Area:     3.80 cm  LV Volumes (MOD) LV vol d, MOD A2C: 214.0 ml LV vol d, MOD A4C: 242.0 ml LV vol s, MOD A2C: 172.0 ml LV vol s, MOD A4C: 166.0 ml LV SV MOD A2C:     42.0 ml LV SV MOD A4C:     242.0 ml LV SV MOD BP:      57.0 ml RIGHT VENTRICLE RV S prime:     12.00 cm/s TAPSE (M-mode): 2.1 cm LEFT ATRIUM             Index        RIGHT ATRIUM           Index LA diam:        3.20 cm 1.53 cm/m   RA Area:     26.70 cm LA Vol (A2C):   70.0 ml 33.41 ml/m  RA Volume:   90.50 ml  43.20 ml/m LA Vol (A4C):   60.9 ml 29.07 ml/m LA Biplane Vol: 65.9 ml 31.46 ml/m  AORTIC VALVE AV Area (Vmax):    1.94 cm AV Area (Vmean):   1.97 cm AV Area (VTI):     2.35 cm AV Vmax:           177.00 cm/s AV Vmean:          108.000 cm/s AV VTI:            0.357 m AV Peak Grad:      12.5 mmHg AV Mean Grad:      6.0 mmHg LVOT Vmax:         90.10 cm/s LVOT Vmean:        56.100 cm/s LVOT VTI:          0.221 m LVOT/AV VTI ratio: 0.62  AORTA Ao Root diam: 3.90 cm  MITRAL VALVE                  TRICUSPID VALVE MV Area (PHT): 3.34 cm       TR Peak grad:   20.8 mmHg MV  Decel Time: 227 msec       TR Vmax:        228.00 cm/s MR Peak grad:    136.9 mmHg MR Mean grad:    75.0 mmHg    SHUNTS MR Vmax:         585.00 cm/s  Systemic VTI:  0.22 m MR Vmean:        399.0 cm/s   Systemic Diam: 2.20 cm MR PISA:         2.26 cm MR PISA Eff ROA: 9 mm MR PISA Radius:  0.60 cm MV E velocity: 42.00 cm/s MV A velocity: 101.00 cm/s MV E/A ratio:  0.42 Dalton McleanMD Electronically signed by Franki Monte Signature Date/Time: 03/19/2022/6:18:40 PM    Final    CARDIAC CATHETERIZATION  Result Date: 03/19/2022   Mid LM to Dist LM lesion is 20% stenosed.   Ost LAD to Prox LAD lesion is 25% stenosed.   Prox LAD to Mid LAD lesion is 35% stenosed.   3rd Mrg lesion is 100% stenosed.   Prox RCA to Mid RCA lesion is 40% stenosed.   RPDA lesion is 80% stenosed.   There is mild left ventricular systolic dysfunction.   LV end diastolic pressure is mildly elevated.   The left ventricular ejection fraction is 45-50% by visual estimate. 2 vessel obstructive CAD - 100% occlusion of distal OM3 appears to be the culprit. The ostial PDA lesion is unchanged from 2014 Patent stents in the LAD and RCA Mild LV dysfunction with inferior wall motion abnormality Mildly elevated LVEDP 16 mm Hg Plan: recommend medical therapy  DG Chest 2 View  Result Date: 03/18/2022 CLINICAL DATA:  Chest pain EXAM: CHEST - 2 VIEW COMPARISON:  02/25/2022 FINDINGS: Normal heart size and stable aortic tortuosity. Sizable hiatal hernia. Dual-chamber ICD leads from the left. There is no edema, consolidation, effusion, or pneumothorax. Artifact from EKG leads. IMPRESSION: Stable exam.  No acute finding. Electronically Signed   By: Jorje Guild M.D.   On: 03/18/2022 05:45   CUP PACEART INCLINIC DEVICE CHECK  Result Date: 03/13/2022 ICD check in clinic. Normal device function. Thresholds and sensing consistent with previous device measurements. Impedance trends stable over time. Multiple ventricular arrhythmias-Steven Guzman aware.  Histogram distribution appropriate for patient and level of activity. Increased RV output to 2.4 V at 0.4 ms d/t slight increase in threshold. Device programmed at appropriate safety margins. Device programmed to optimize intrinsic conduction. ERI.  Pt scheduled for gen change.Myrtie Hawk, BSN, RN  CT ABDOMEN PELVIS W CONTRAST  Result Date: 03/08/2022 CLINICAL DATA:  Abdominal pain, constipation EXAM: CT ABDOMEN AND PELVIS WITH CONTRAST TECHNIQUE: Multidetector CT imaging of the abdomen and pelvis was performed using the standard protocol following bolus administration of intravenous contrast. RADIATION DOSE REDUCTION: This exam was performed according to the departmental dose-optimization program which includes automated exposure control, adjustment of the mA and/or kV according to patient size and/or use of iterative reconstruction technique. CONTRAST:  43m OMNIPAQUE IOHEXOL 350 MG/ML SOLN COMPARISON:  02/25/2022 FINDINGS: Lower chest: No acute abnormality. Moderate hiatal hernia with intrathoracic gastric fundus. Coronary artery calcifications. Hepatobiliary: No solid liver abnormality is seen. No gallstones, gallbladder wall thickening, or biliary dilatation. Postprocedural pneumobilia Pancreas: Unremarkable. No pancreatic ductal dilatation or  surrounding inflammatory changes. Spleen: Normal in size without significant abnormality. Adrenals/Urinary Tract: Adrenal glands are unremarkable. Kidneys are normal, without renal calculi, solid lesion, or hydronephrosis. Bladder is unremarkable. Stomach/Bowel: Stomach is within normal limits. Appendix appears normal. Sigmoid diverticulosis. Circumferential wall thickening of the rectum (series 7, image 95, series 8, image 2). Vascular/Lymphatic: Aortic atherosclerosis. 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 No enlarged abdominal or pelvic lymph nodes. Reproductive: No mass or other significant abnormality.  Other: Large, incompletely imaged left inguinal hernia containing sigmoid colon without evidence of obstruction. No ascites. Musculoskeletal: No acute or significant osseous findings. Status post right hip total arthroplasty, streak artifact from which limits evaluation of the low pelvis. IMPRESSION: 1. Circumferential wall thickening of the rectum, suggestive of nonspecific infectious or inflammatory proctitis. Underlying mass is not excluded. Note that evaluation of the low pelvis has generally been significantly limited on this and prior examinations by the presence of dense metallic streak artifact from adjacent hip arthroplasty. 2. Large, incompletely imaged left inguinal hernia containing sigmoid colon without evidence of obstruction. 3. Sigmoid diverticulosis without evidence of acute diverticulitis. 4. Hiatal hernia. 5. 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 Recommend follow-up ultrasound every 3 years. This recommendation follows ACR consensus guidelines: White Paper of the ACR Incidental Findings Committee II on Vascular Findings. J Am Coll Radiol 2013; 10:789-794. 6. Coronary artery disease. Aortic Atherosclerosis (ICD10-I70.0). Electronically Signed   By: Delanna Ahmadi M.D.   On: 03/08/2022 18:22   CUP PACEART REMOTE DEVICE CHECK  Result Date: 02/26/2022 Scheduled remote reviewed. Normal device function.  2 episodes with Antitachycardia pacing (ATP) therapy delivered to convert arrhythmia. Events occurred 11/25 and 11/27, EGM's show 1:1 with rates >110 falling in to the VT-1 zone. Battery estimated <17moNext remote 2/27 route for IFU LA NOTE: Reviewed with BSX.  device is actually <337ms to ERI  not to explant.  Patient put on monthly remotes.  Also, past due to see TaLovena Leappt made.  see epic encounter for full details as patient with recent hospital admission and has ACUTE COVID dx on abx and prednisone, with change made to his beta blocker. Routed all to Dr.  TaLovena LeMW,RNNov 27, 2023 01:19 - Yellow Alert - Antitachycardia pacing (ATP) therapy delivered to convert arrhythmia. Feb 21, 2022 23:39 - Yellow Alert - Antitachycardia pacing (ATP) therapy delivered to convert arrhythmia.  CT Angio Chest PE W and/or Wo Contrast  Result Date: 02/25/2022 CLINICAL DATA:  Sternal chest pain, epigastric pain for 2 hours, COVID positive EXAM: CT ANGIOGRAPHY CHEST CT ABDOMEN AND PELVIS WITH CONTRAST TECHNIQUE: Multidetector CT imaging of the chest was performed using the standard protocol during bolus administration of intravenous contrast. Multiplanar CT image reconstructions and MIPs were obtained to evaluate the vascular anatomy. Multidetector CT imaging of the abdomen and pelvis was performed using the standard protocol during bolus administration of intravenous contrast. RADIATION DOSE REDUCTION: This exam was performed according to the departmental dose-optimization program which includes automated exposure control, adjustment of the mA and/or kV according to patient size and/or use of iterative reconstruction technique. CONTRAST:  10087mMNIPAQUE IOHEXOL 350 MG/ML SOLN COMPARISON:  02/25/2022, 05/16/2021 FINDINGS: CTA CHEST FINDINGS Cardiovascular: This is a technically adequate evaluation of the pulmonary vasculature. There are no filling defects or pulmonary emboli. Ascending thoracic aortic aneurysm measures 4.5 cm, previously measuring 4.3 cm. Aneurysm of the distal aortic arch measures up to 4.5 cm, previously 4.1 cm.  Proximal descending thoracic aorta measures up to 3.7 cm, unchanged. Evaluation of the aortic lumen is limited due to timing of contrast bolus. Diffuse atherosclerosis of the aorta and coronary vasculature is again noted. Left ventricular dilatation is noted. No pericardial effusion. Dual lead pacer again noted unchanged. Mediastinum/Nodes: No enlarged mediastinal, hilar, or axillary lymph nodes. Thyroid gland, trachea, and esophagus demonstrate no  significant findings. Stable hiatal hernia. Lungs/Pleura: Upper lobe predominant emphysema is again noted. There is bilateral bronchial wall thickening, with scattered ground-glass airspace disease in the lung apices, consistent with given history of COVID-19 pneumonia. No effusion or pneumothorax. Musculoskeletal: No acute or destructive bony lesions. Reconstructed images demonstrate no additional findings. Review of the MIP images confirms the above findings. CT ABDOMEN and PELVIS FINDINGS Hepatobiliary: Gas is again identified within the biliary tree and gallbladder lumen consistent with prior sphincterotomy. No evidence of cholelithiasis or cholecystitis. The liver is unremarkable. Pancreas: Unremarkable. No pancreatic ductal dilatation or surrounding inflammatory changes. Spleen: Normal in size without focal abnormality. Adrenals/Urinary Tract: The kidneys, adrenals, and bladder are stable. No urinary tract calculi or obstructive uropathy. Stomach/Bowel: No bowel obstruction or ileus. No bowel wall thickening or inflammatory change. Minimal retained stool within the colon. Vascular/Lymphatic: Fusiform infrarenal abdominal aortic aneurysm measuring up to 3.3 cm, stable. Diffuse atherosclerosis is again noted. No evidence of dissection. No pathologic adenopathy within the abdomen or pelvis. Reproductive: Evaluation of prostate is limited due to streak artifact from right hip arthroplasty. No gross abnormality. Other: No free fluid or free intraperitoneal gas. Small fat containing umbilical hernia is again noted. There is a large left inguinal hernia containing a portion of the sigmoid colon, stable. This is incompletely evaluated on this study, but there is no evidence of incarceration or bowel ischemia within the visualized portions of the hernia. Musculoskeletal: Right hip arthroplasty. No acute or destructive bony lesions. Reconstructed images demonstrate no additional findings. Review of the MIP images  confirms the above findings. IMPRESSION: Chest: 1. No evidence of pulmonary embolus. 2. Thoracic aortic aneurysm, measuring 4.5 cm within the ascending aorta and 4.5 cm at the distal aortic arch. Recommend semi-annual imaging followup by CTA or MRA and referral to cardiothoracic surgery if not already obtained. This recommendation follows 2010 ACCF/AHA/AATS/ACR/ASA/SCA/SCAI/SIR/STS/SVM Guidelines for the Diagnosis and Management of Patients With Thoracic Aortic Disease. Circulation. 2010; 121: B638-L373. Aortic aneurysm NOS (ICD10-I71.9) 3. Bilateral bronchial wall thickening, with patchy upper lobe predominant ground-glass airspace disease. Findings are consistent with given history of COVID-19. 4. Left ventricular dilation. 5. Hiatal hernia. 6. Aortic Atherosclerosis (ICD10-I70.0). Coronary artery atherosclerosis. Abdomen/pelvis: 1. 3.3 cm infrarenal abdominal aortic aneurysm. Recommend follow-up ultrasound every 3 years. This recommendation follows ACR consensus guidelines: White Paper of the ACR Incidental Findings Committee II on Vascular Findings. J Am Coll Radiol 2013; 42:876-811. 2. Stable umbilical and left inguinal hernias. 3. No acute intra-abdominal or intrapelvic process. Electronically Signed   By: Randa Ngo M.D.   On: 02/25/2022 20:11   CT ABDOMEN PELVIS W CONTRAST  Result Date: 02/25/2022 CLINICAL DATA:  Sternal chest pain, epigastric pain for 2 hours, COVID positive EXAM: CT ANGIOGRAPHY CHEST CT ABDOMEN AND PELVIS WITH CONTRAST TECHNIQUE: Multidetector CT imaging of the chest was performed using the standard protocol during bolus administration of intravenous contrast. Multiplanar CT image reconstructions and MIPs were obtained to evaluate the vascular anatomy. Multidetector CT imaging of the abdomen and pelvis was performed using the standard protocol during bolus administration of intravenous contrast. RADIATION DOSE REDUCTION: This exam was performed according  to the departmental  dose-optimization program which includes automated exposure control, adjustment of the mA and/or kV according to patient size and/or use of iterative reconstruction technique. CONTRAST:  163m OMNIPAQUE IOHEXOL 350 MG/ML SOLN COMPARISON:  02/25/2022, 05/16/2021 FINDINGS: CTA CHEST FINDINGS Cardiovascular: This is a technically adequate evaluation of the pulmonary vasculature. There are no filling defects or pulmonary emboli. Ascending thoracic aortic aneurysm measures 4.5 cm, previously measuring 4.3 cm. Aneurysm of the distal aortic arch measures up to 4.5 cm, previously 4.1 cm. Proximal descending thoracic aorta measures up to 3.7 cm, unchanged. Evaluation of the aortic lumen is limited due to timing of contrast bolus. Diffuse atherosclerosis of the aorta and coronary vasculature is again noted. Left ventricular dilatation is noted. No pericardial effusion. Dual lead pacer again noted unchanged. Mediastinum/Nodes: No enlarged mediastinal, hilar, or axillary lymph nodes. Thyroid gland, trachea, and esophagus demonstrate no significant findings. Stable hiatal hernia. Lungs/Pleura: Upper lobe predominant emphysema is again noted. There is bilateral bronchial wall thickening, with scattered ground-glass airspace disease in the lung apices, consistent with given history of COVID-19 pneumonia. No effusion or pneumothorax. Musculoskeletal: No acute or destructive bony lesions. Reconstructed images demonstrate no additional findings. Review of the MIP images confirms the above findings. CT ABDOMEN and PELVIS FINDINGS Hepatobiliary: Gas is again identified within the biliary tree and gallbladder lumen consistent with prior sphincterotomy. No evidence of cholelithiasis or cholecystitis. The liver is unremarkable. Pancreas: Unremarkable. No pancreatic ductal dilatation or surrounding inflammatory changes. Spleen: Normal in size without focal abnormality. Adrenals/Urinary Tract: The kidneys, adrenals, and bladder are stable.  No urinary tract calculi or obstructive uropathy. Stomach/Bowel: No bowel obstruction or ileus. No bowel wall thickening or inflammatory change. Minimal retained stool within the colon. Vascular/Lymphatic: Fusiform infrarenal abdominal aortic aneurysm measuring up to 3.3 cm, stable. Diffuse atherosclerosis is again noted. No evidence of dissection. No pathologic adenopathy within the abdomen or pelvis. Reproductive: Evaluation of prostate is limited due to streak artifact from right hip arthroplasty. No gross abnormality. Other: No free fluid or free intraperitoneal gas. Small fat containing umbilical hernia is again noted. There is a large left inguinal hernia containing a portion of the sigmoid colon, stable. This is incompletely evaluated on this study, but there is no evidence of incarceration or bowel ischemia within the visualized portions of the hernia. Musculoskeletal: Right hip arthroplasty. No acute or destructive bony lesions. Reconstructed images demonstrate no additional findings. Review of the MIP images confirms the above findings. IMPRESSION: Chest: 1. No evidence of pulmonary embolus. 2. Thoracic aortic aneurysm, measuring 4.5 cm within the ascending aorta and 4.5 cm at the distal aortic arch. Recommend semi-annual imaging followup by CTA or MRA and referral to cardiothoracic surgery if not already obtained. This recommendation follows 2010 ACCF/AHA/AATS/ACR/ASA/SCA/SCAI/SIR/STS/SVM Guidelines for the Diagnosis and Management of Patients With Thoracic Aortic Disease. Circulation. 2010; 121:: Z308-M578 Aortic aneurysm NOS (ICD10-I71.9) 3. Bilateral bronchial wall thickening, with patchy upper lobe predominant ground-glass airspace disease. Findings are consistent with given history of COVID-19. 4. Left ventricular dilation. 5. Hiatal hernia. 6. Aortic Atherosclerosis (ICD10-I70.0). Coronary artery atherosclerosis. Abdomen/pelvis: 1. 3.3 cm infrarenal abdominal aortic aneurysm. Recommend follow-up  ultrasound every 3 years. This recommendation follows ACR consensus guidelines: White Paper of the ACR Incidental Findings Committee II on Vascular Findings. J Am Coll Radiol 2013; 146:962-952 2. Stable umbilical and left inguinal hernias. 3. No acute intra-abdominal or intrapelvic process. Electronically Signed   By: MRanda NgoM.D.   On: 02/25/2022 20:11   CT ABDOMEN PELVIS WO CONTRAST  Result Date: 02/25/2022 CLINICAL DATA:  Abdominal pain EXAM: CT ABDOMEN AND PELVIS WITHOUT CONTRAST TECHNIQUE: Multidetector CT imaging of the abdomen and pelvis was performed following the standard protocol without IV contrast. RADIATION DOSE REDUCTION: This exam was performed according to the departmental dose-optimization program which includes automated exposure control, adjustment of the mA and/or kV according to patient size and/or use of iterative reconstruction technique. COMPARISON:  CT 05/16/2021, 12/17/2017 FINDINGS: Lower chest: Included lung bases are clear.  Heart size is normal. Hepatobiliary: Unremarkable unenhanced appearance of the liver. No focal liver lesion identified. Pneumobilia is again seen compatible with prior sphincterotomy. Gallbladder within normal limits. No hyperdense gallstone. No biliary dilatation. Pancreas: Unremarkable. No pancreatic ductal dilatation or surrounding inflammatory changes. Spleen: Normal in size without focal abnormality. Adrenals/Urinary Tract: Stable benign bilateral adrenal adenomas. 4.0 cm exophytic cyst arising from the lower pole of the right kidney. No follow-up imaging required. No evidence of a solid renal mass. No stone or hydronephrosis. Urinary bladder is partially obscured by metal artifact from patient's hip prosthesis, but appears otherwise unremarkable. Stomach/Bowel: Moderate-sized hiatal hernia. Stomach otherwise within normal limits. Duodenal diverticulum. No dilated loops of bowel. Proximal sigmoid colon extends into patient's left inguinal hernia. No  focal bowel wall thickening or inflammatory changes. A normal appendix is present in the right lower quadrant. Vascular/Lymphatic: Aortoiliac atherosclerosis. Stable 3.4 cm infrarenal aortic aneurysm. No abdominopelvic lymphadenopathy. Reproductive: Prostate is unremarkable. Other: Large left inguinal hernia containing non compromised loop of colon. Hernia has significantly increased in size since 2019. Small fat containing umbilical hernia without complicating feature. No ascites. No pneumoperitoneum. Musculoskeletal: No acute bony findings. 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 Postsurgical changes from right total hip arthroplasty. Fluid collection in the region of the right peritrochanteric bursa measuring approximately 10 x 2 cm (series 6, image 83), evaluation limited by the degree of streak artifact. IMPRESSION: 1. No acute abdominopelvic findings. 2. Large left inguinal hernia containing a non-obstructed loop of colon. Hernia has significantly increased in size since 2019. 3. Moderate-sized hiatal hernia. 4. Fluid collection in the region of the right peritrochanteric bursa measuring approximately 10 x 2 cm, likely representing peritrochanteric bursitis. 5. Stable 3.4 cm infrarenal aortic aneurysm. Recommend follow-up ultrasound every 3 years. This recommendation follows ACR consensus guidelines: White Paper of the ACR Incidental Findings Committee II on Vascular Findings. J Am Coll Radiol 2013; 10:789-794. 6. Aortic atherosclerosis (ICD10-I70.0). Electronically Signed   By: Davina Poke D.O.   On: 02/25/2022 17:07   DG Chest 2 View  Result Date: 02/25/2022 CLINICAL DATA:  Chest pain.  Recent positive COVID test. EXAM: CHEST - 2 VIEW COMPARISON:  03/29/2021; chest CT - 03/15/2022 FINDINGS: Grossly unchanged cardiac silhouette and mediastinal contours with atherosclerotic plaque within the thoracic aorta. Stable position of support apparatus. The  lungs are hyperexpanded with flattening of bilateral hemidiaphragms. No focal airspace opacities. No pleural effusion or pneumothorax. No evidence of edema. Old/healed left eighth rib fracture. No acute osseous abnormalities. IMPRESSION: Similar findings of lung hyperexpansion without superimposed acute cardiopulmonary disease. Specifically, no focal airspace opacities to suggest pneumonia. Electronically Signed   By: Sandi Mariscal M.D.   On: 02/25/2022 15:06   CUP PACEART REMOTE DEVICE CHECK  Result Date: 02/24/2022 Scheduled remote reviewed. Normal device function.  2 episodes with Antitachycardia pacing (ATP) therapy delivered to convert arrhythmia. Events occurred 11/25 and 11/27, EGM's show 1:1 with rates >110 falling in to the VT-1 zone. Battery estimated <60moNext  remote 2/27 route for IFU LANov 27, 2023 01:19 - Yellow Alert - Antitachycardia pacing (ATP) therapy delivered to convert arrhythmia. Feb 21, 2022 23:39 - Yellow Alert - Antitachycardia pacing (ATP) therapy delivered to convert arrhythmia. Scheduled remote reviewed. Normal device function.  2 episodes with Antitachycardia pacing (ATP) therapy delivered to convert arrhythmia. Events occurred 11/25 and 11/27, EGM's show 1:1 with rates >110 falling in to the VT-1 zone. Battery estimated <47moNext remote 2/27 route for [facility] LA NOTE: Reviewed with BSX.  device is actually <322ms to ERI  not to explant.  Patient put on monthly remotes.  Also, past due to see TaLovena Leappt made.  see epic encounter for full details as patient with recent hospital admission and has ACUTE COVID dx on abx and prednisone, with change made to his beta blocker. Routed all to Dr. TaLovena LeMW,RNNov 27, 2023 01:19 - Yellow Alert - Antitachycardia pacing (ATP) therapy delivered to convert arrhythmia. Feb 21, 2022 23:39 - Yellow Alert - Antitachycardia pacing (ATP) therapy delivered to convert arrhythmia.  Disposition   Pt is being discharged home today in good  condition.  Follow-up Plans & Appointments     Follow-up Information     CoElgie CollardPA-C Follow up on 03/31/2022.   Specialty: Cardiology Why: _0 :20PM. Cardiology post hospital follow up Contact information: [id number] [phone]       CrSanda KleinMD Follow up on 06/30/2022.   Specialty: Cardiology Why: _1 . Cardiology follow up, changed provider from Dr. SmTamala Juliano Dr. CrSherril Croonnformation: [address] [phone]              Discharge Instructions     AMB referral to Phase II Cardiac Rehabilitation   Complete by: As directed    Diagnosis: NSTEMI   After initial evaluation and assessments completed: Virtual Based Care may be provided alone or in conjunction with Phase 2 Cardiac Rehab based on patient barriers.: Yes   [facility] OR Traditional Cardiac Rehabilitation (TCR) *If criteria for ICR are not met [facility] Yes        Discharge Medications   Allergies as of 03/20/2022       Reactions   Sulfa Antibiotics Hives        Medication List     STOP taking these medications    benazepril 10 MG tablet Commonly known as: LOTENSIN   potassium chloride SA 20 MEQ tablet Commonly known as: KLOR-CON M       TAKE these medications    albuterol (2.5 MG/3ML) 0.083% nebulizer solution Commonly known as: PROVENTIL USE 1 VIAL VIA NEBULIZER EVERY 6 HOURS AS NEEDED FOR WHEEZING OR SHORTNESS OF BREATH What changed:  how much to take how to take this when to take this   amiodarone 200 MG tablet Commonly known as: PACERONE TAKE ONE TABLET BY MOUTH DAILY   apixaban 5 MG Tabs tablet Commonly known as: Eliquis Take 1 tablet (5 mg total) by mouth 2 (two) times daily.   ascorbic acid 500 MG tablet Commonly known as: VITAMIN C Take 1 tablet by mouth daily.   aspirin EC 81 MG tablet Take 1  tablet (81 mg total) by mouth daily. Swallow whole. Start taking on: March 21, 2022   buPROPion 75 MG tablet Commonly known as: WELLBUTRIN TAKE ONE TABLET BY MOUTH DAILY What changed: when to take  this   busPIRone 15 MG tablet Commonly known as: BUSPAR Take 15 mg by mouth 2 (two) times daily. Patient taking 10 mg by mouth twice daily   calcium citrate 950 (200 Ca) MG tablet Commonly known as: CALCITRATE - dosed in mg elemental calcium Take 200 mg of elemental calcium by mouth daily.   carvedilol 3.125 MG tablet Commonly known as: COREG Take 1 tablet (3.125 mg total) by mouth 2 (two) times daily.   CENTRUM ADULTS PO Take by mouth daily.   cetirizine 10 MG tablet Commonly known as: ZYRTEC Take 1 tablet by mouth daily.   Combivent Respimat 20-100 MCG/ACT Aers respimat Generic drug: Ipratropium-Albuterol Inhale 1 puff into the lungs every 6 (six) hours. Shortness of breath or wheezing   empagliflozin 10 MG Tabs tablet Commonly known as: JARDIANCE Take 1 tablet (10 mg total) by mouth daily. Start taking on: March 21, 2022   escitalopram 20 MG tablet Commonly known as: LEXAPRO Take 20 mg by mouth daily. Take 1/2 tablet (10 mg total ) by mouth daily   Fenofibric Acid 35 MG Tabs Take 35 mg by mouth daily.   fluticasone 50 MCG/ACT nasal spray Commonly known as: FLONASE Place 2 sprays into both nostrils daily.   Flutter Devi Use as directed What changed:  how much to take how to take this when to take this   furosemide 20 MG tablet Commonly known as: LASIX Take 1 tablet (20 mg total) by mouth daily as needed for fluid or edema. What changed:  medication strength when to take this reasons to take this   gabapentin 300 MG capsule Commonly known as: NEURONTIN Take 1 capsule (300 mg total) by mouth 2 (two) times daily. What changed: additional instructions   guaiFENesin 600 MG 12 hr tablet Commonly known as: MUCINEX Take 1 tablet by mouth 2 (two) times  daily.   IRON PO Take 1 tablet by mouth daily.   MAGnesium-Oxide 400 (241.3 Mg) MG tablet Generic drug: magnesium oxide TAKE 1 TABSULE BY MOUTH DAILY What changed: See the new instructions.   mexiletine 200 MG capsule Commonly known as: MEXITIL Take 1 capsule (200 mg total) by mouth 2 (two) times daily.   mupirocin ointment 2 % Commonly known as: BACTROBAN Apply 1 application. topically 2 (two) times daily. Apply to lower leg wounds daily prn   nitroGLYCERIN 0.4 MG SL tablet Commonly known as: NITROSTAT Place 1 tablet (0.4 mg total) under the tongue every 5 (five) minutes as needed for chest pain. DISSOLVE 1 TABLET UNDER THE TONGUE EVERY 5 MINUTES FOR 3 DOSES   omeprazole 20 MG capsule Commonly known as: PRILOSEC Take 20 mg by mouth daily.   optichamber diamond Misc optichamber VHC What changed:  how much to take how to take this when to take this   polyethylene glycol powder 17 GM/SCOOP powder Commonly known as: GLYCOLAX/MIRALAX Take 1 capful in 8 ounces of fluid each morning   rosuvastatin 40 MG tablet Commonly known as: CRESTOR Take 1 tablet (40 mg total) by mouth daily.   sacubitril-valsartan 24-26 MG Commonly known as: ENTRESTO Take 1 tablet by mouth 2 (two) times daily.   SOOTHE XP OP Place 2 drops into both eyes daily as needed (dry eyes).   Stiolto Respimat 2.5-2.5 MCG/ACT Aers Generic drug: Tiotropium Bromide-Olodaterol Inhale 2 puffs into the lungs daily.   STOOL SOFTENER/LAXATIVE PO Take 2 tablets by mouth daily.   tamsulosin 0.4 MG Caps capsule Commonly known as: FLOMAX TAKE ONE CAPSULE BY MOUTH  DAILY AFTER SUPPER What changed:  how much to take how to take this when to take this   Vitamin B-12 5000 MCG Subl Place under the tongue daily.           Outstanding Labs/Studies   Obtain BMET on follow up  Duration of Discharge Encounter   Greater than 30 minutes including physician time.  Hilbert Corrigan, PA 03/20/2022, 12:27  PM  I have examined the patient and reviewed assessment and plan and discussed with patient.  Agree with above as stated.     Plan for discharge later today.  Medical therapy of his coronary artery disease.  Trying to uptitrate medications for CHF.  LVEF found to be severely decreased.  Aggressive medical therapy for CAD as well.  Restart anticoagulation today.  Plan for discharge later today.   Larae Grooms

## 2022-03-20 NOTE — TOC Benefit Eligibility Note (Signed)
Patient Teacher, English as a foreign language completed.    The patient is currently admitted and upon discharge could be taking Jardiance.  The current 30 day co-pay is $172.30.    Patient Teacher, English as a foreign language completed.    The patient is currently admitted and upon discharge could be taking Entresto.  The current 30 day co-pay is $323.28.   The patient is insured through [facility]

## 2022-03-20 NOTE — Plan of Care (Signed)
  Problem: Education: Goal: Knowledge of cardiac device and self-care will improve Outcome: Progressing Goal: Ability to safely manage health related needs after discharge will improve Outcome: Progressing Goal: Individualized Educational Video(s) Outcome: Progressing   Problem: Cardiac: Goal: Ability to achieve and maintain adequate cardiopulmonary perfusion will improve Outcome: Progressing   Problem: Education: Goal: Understanding of cardiac disease, CV risk reduction, and recovery process will improve Outcome: Progressing Goal: Individualized Educational Video(s) Outcome: Progressing   Problem: Activity: Goal: Ability to tolerate increased activity will improve Outcome: Progressing   Problem: Cardiac: Goal: Ability to achieve and maintain adequate cardiovascular perfusion will improve Outcome: Progressing   Problem: Health Behavior/Discharge Planning: Goal: Ability to safely manage health-related needs after discharge will improve Outcome: Progressing   Problem: Education: Goal: Understanding of CV disease, CV risk reduction, and recovery process will improve Outcome: Progressing Goal: Individualized Educational Video(s) Outcome: Progressing   Problem: Activity: Goal: Ability to return to baseline activity level will improve Outcome: Progressing   Problem: Cardiovascular: Goal: Ability to achieve and maintain adequate cardiovascular perfusion will improve Outcome: Progressing Goal: Vascular access site(s) Level 0-1 will be maintained Outcome: Progressing   Problem: Health Behavior/Discharge Planning: Goal: Ability to safely manage health-related needs after discharge will improve Outcome: Progressing   Problem: Education: Goal: Knowledge of General Education information will improve Description: Including pain rating scale, medication(s)/side effects and non-pharmacologic comfort measures Outcome: Progressing   Problem: Health Behavior/Discharge  Planning: Goal: Ability to manage health-related needs will improve Outcome: Progressing   Problem: Clinical Measurements: Goal: Ability to maintain clinical measurements within normal limits will improve Outcome: Progressing Goal: Will remain free from infection Outcome: Progressing Goal: Diagnostic test results will improve Outcome: Progressing Goal: Respiratory complications will improve Outcome: Progressing Goal: Cardiovascular complication will be avoided Outcome: Progressing   Problem: Activity: Goal: Risk for activity intolerance will decrease Outcome: Progressing   Problem: Nutrition: Goal: Adequate nutrition will be maintained Outcome: Progressing   Problem: Coping: Goal: Level of anxiety will decrease Outcome: Progressing   Problem: Elimination: Goal: Will not experience complications related to bowel motility Outcome: Progressing Goal: Will not experience complications related to urinary retention Outcome: Progressing   Problem: Pain Managment: Goal: General experience of comfort will improve Outcome: Progressing   Problem: Safety: Goal: Ability to remain free from injury will improve Outcome: Progressing   Problem: Skin Integrity: Goal: Risk for impaired skin integrity will decrease Outcome: Progressing

## 2022-03-20 NOTE — Progress Notes (Signed)
   Heart Failure Stewardship Pharmacist Progress Note   PCP: Yvonna Alanis, NP PCP-Cardiologist: Sanda Klein, MD    HPI:  81 yo M with PMH of CAD s/p PCI with MI, ICM, VT s/p ablation, afib, AAA, HTN, HLD, and CKD IIIa.  He presented to the ED on 12/20 with chest pain and shortness of breath. CXR without edema. Admitted with NSTEMI. [facility] to [facility] on 12/21 with 2 vessel obstructive CAD. Recommended medical therapy. Formal ECHO done 12/21 showed LVEF of 25-30%, regional wall motion abnormalities, mild LVH, G1DD, and RV normal.   Discharge HF Medications: Diuretic: furosemide 20 mg daily PRN Beta Blocker: carvedilol 3.125 mg BID  Prior to admission HF Medications: Diuretic: furosemide 20 mg daily Beta blocker: carvedilol 3.125 mg BID ACE/ARB/ARNI: benazepril 5 mg daily  Pertinent Lab Values: Serum creatinine 0.69, BUN 6, Potassium 3.2, Sodium 134, BNP 90.1, A1c 5.3   Vital Signs: Weight: 192 lbs (admission weight: 192 lbs) Blood pressure: 130/80s  Heart rate: 60s   Medication Assistance / Insurance Benefits Check: Does the patient have prescription insurance?  Yes Type of insurance plan: [facility] Medicare  Outpatient Pharmacy:  Prior to admission [facility] Is the patient willing to use [facility] at discharge? Yes Is the patient willing to transition their outpatient pharmacy to utilize a [facility] outpatient pharmacy?   Pending    Assessment: 1. HFrEF/ICM (LVEF 25-30%). NYHA class I-II symptoms. - Continue furosemide 20 mg daily PRN at discharge - Continue carvedilol 3.125 mg BID - Consider adding Entresto 24/26 mg BID and Jardiance 10 mg daily for discharge - Consider adding spironolactone at follow up   Plan: 1) Medication changes recommended at this time: - Add Entresto 24/26 mg BID - Add Jardiance 10 mg daily  2) Patient assistance: - Copays for [employer] and Jardiance expensive - will investigate patient assistance options (grants, PAP) at  outpatient   3)  Education  - Patient has been educated on current HF medications and potential additions to HF medication regimen - Patient verbalizes understanding that over the next few months, these medication doses may change and more medications may be added to optimize HF regimen - Patient has been educated on basic disease state pathophysiology and goals of therapy   Kerby Nora, PharmD, BCPS Heart Failure Cytogeneticist Phone [phone]

## 2022-03-20 NOTE — Care Management Important Message (Signed)
Important Message  Patient Details  Name: KYO COCUZZA MRN: [id number] Date of Birth: [birth year]   Medicare Important Message Given:  Yes     Shelda Altes 03/20/2022, 9:00 AM

## 2022-03-20 NOTE — Progress Notes (Signed)
Heart Failure Navigator Progress Note  Assessed for Heart & Vascular TOC clinic readiness.  Patient does not meet criteria due to medications have been optimized and patient has a scheduled hospital follow up with [facility] on 03/31/2022..   Navigator will sign off at this time  Earnestine Leys, BSN, RN Heart Failure Leisure centre manager Chat Only

## 2022-03-24 ENCOUNTER — Telehealth: Payer: Self-pay

## 2022-03-24 ENCOUNTER — Telehealth: Payer: Self-pay | Admitting: Internal Medicine

## 2022-03-24 NOTE — Telephone Encounter (Signed)
ICD Alert transmission received.   1 Treated episode classified as VT-1 detection.  EGM shows 1:1 atrial tachycardia with occasional PVC, treated inappropriately and unsuccessfully with ATP x 6.  Average V rate 110 bpm.  Sent to Alert Group for review.  146 non-treated detections.  Longest duration 2 minutes 32 seconds. Available EGMs show 1:1 atrial driven tachycardia with average V rate 110-111 bpm or SR with PVCs.  Battery previously triggered RRT with Gen change scheduled for 04/20/2022 per Epic. Follow up as scheduled. [place]   Reviewed this with [id number] rep, Roderic Palau. Confirmed inappropriate ATP; appears SR  however, occurred in very low zone (110-140bp) as result of PVC's, no shocks occur in this zone. Recommend leaving for now, gen change 04/20/22. You can bring patient in and turn off the V discriminator in needed in future. This will prevent from happening.  Will review with Dr. Lovena Le if needed and follow up with patient. MW,RN Patient has hx of this occurring.

## 2022-03-24 NOTE — Telephone Encounter (Signed)
Inbound call from patient stating that he has been having abdominal pain for the last few weeks and has been to the hospital twice and has had CAT scans done and they have not been able to find what the cause is. Patient was offered Dr. Blanch Media next available 2/14 and also January 19th with the PA's and he stated he could not wait that long  to be seen. Patient is requesting a call back to discuss. Please advise.

## 2022-03-24 NOTE — Telephone Encounter (Signed)
Pt states he has had several episodes with abd pain that starts at this breast bone and goes down to his stomach. He has had several ER visits and CT's done but nothing has been found. Sometimes he will have a loose stool and the pain seems to go away. Pt scheduled to see Amy Esterwood PA 04/03/22 at 1:30pm. Pt aware of appt.

## 2022-03-24 NOTE — Patient Outreach (Signed)
  Care Coordination TOC Note Transition Care Management Follow-up Telephone Call Date of discharge and from where: 03/20/22-[place]  Dx: "NSTEMI" How have you been since you were released from the hospital? Patient voices that from cardiac standpoint he is doing well. He has resumed most of all his previous activities. He gets a little short-winded with exertion. He admits that he has not been using inhalers-encouraged to do so. Denies any chest pain.  Any questions or concerns? Yes-Patient states he has already called GI MD this morning and waiting a call back. He has been having intermittent back pain for several weeks now. He has had two CT scans done and nothing was detected. However, he feels like something is still not right. Appetite has been fair. Patient reports he has had BM since returning home.   Items Reviewed: Did the pt receive and understand the discharge instructions provided? Yes  Medications obtained and verified? Yes  Other? Yes  Any new allergies since your discharge? No  Dietary orders reviewed? Yes Do you have support at home? Yes   Home Care and Equipment/Supplies: Were home health services ordered? not applicable If so, what is the name of the agency? N/A  Has the agency set up a time to come to the patient's home? not applicable Were any new equipment or medical supplies ordered?  No What is the name of the medical supply agency? N/A Were you able to get the supplies/equipment? not applicable Do you have any questions related to the use of the equipment or supplies? No  Functional Questionnaire: (I = Independent and D = Dependent) ADLs: I  Bathing/Dressing- I  Meal Prep- I  Eating- I  Maintaining continence- I  Transferring/Ambulation- I  Managing Meds- I  Follow up appointments reviewed:  PCP Hospital f/u appt confirmed? No .03/31/22 Specialist Hospital f/u appt confirmed? Yes  Scheduled to see Raeford Razor on 03/31/22 @ 2:20pm. Are  transportation arrangements needed? No  If their condition worsens, is the pt aware to call PCP or go to the Emergency Dept.? Yes Was the patient provided with contact information for the PCP's office or ED? Yes Was to pt encouraged to call back with questions or concerns? Yes  SDOH assessments and interventions completed:   Yes SDOH Interventions Today    Flowsheet Row Most Recent Value  SDOH Interventions   Food Insecurity Interventions Intervention Not Indicated  Transportation Interventions Intervention Not Indicated       Care Coordination Interventions:  Education provided    Encounter Outcome:  Pt. Visit Completed     Enzo Montgomery, RN,BSN,CCM [place] Management Telephonic Care Management Coordinator Direct Phone: [phone] Toll Free: [phone] Fax: [phone]

## 2022-03-25 NOTE — Progress Notes (Signed)
Remote ICD transmission.   

## 2022-03-26 ENCOUNTER — Ambulatory Visit: Payer: Medicare Other | Admitting: Orthopedic Surgery

## 2022-03-29 NOTE — Progress Notes (Unsigned)
Office Visit    Patient Name: Steven Guzman Date of Encounter: 03/29/2022  PCP:  Yvonna Alanis, NP   [facility]  Cardiologist:  Sanda Klein, MD  Advanced Practice Provider:  No care team member to display Electrophysiologist:  Cristopher Peru, MD   HPI    SALIK GREWELL is a 81 y.o. male past medical history significant for VT and PAF who is followed by Dr. Lovena Le status post ICD, CAD status post MI and PCI to LAD 1994, anginal pain, anxiety, chronic systolic CHF, hypertension, and hyperlipidemia presents today for follow-up appointment.  He was seen by Dr. Lovena Le 03/13/2022 and had not had any ICD shocks.  Class II dyspnea which is multifactorial.  No syncope.  Stopped smoking.  Today, he***  Past Medical History    Past Medical History:  Diagnosis Date   AICD (automatic cardioverter/defibrillator) present    Aneurysm ([place])    a. Aneurysmal infrarenal aorta up to 33 mm on CT 10/2014, recommended f/u due 10/2017   Anginal pain ([place])    Anxiety    Basal cell carcinoma of nose    S/P MOHS   Biliary acute pancreatitis    CAD (coronary artery disease)    a. s/p MI in 1994 with PCI to LAD at that time b. cath 10/2012 demonstrated EF 30%, inferior akinesis with mild hypokinesis of all walls, patent LAD and RCA stents; ostial PDA with 80-90% obstruction with medical therapy recommended    Chronic systolic CHF (congestive heart failure) (HCC)    EF 30 to 35 % as of 09/2014.    CKD (chronic kidney disease), stage III ([place])    Complication of anesthesia 10/2014   "had to have defibrillator w/ERCP"   COPD (chronic obstructive pulmonary disease) ([place])    a. followed by pulmonary, COPD GOLD stage II   Depression    Diverticulosis of colon 07/2014   noted on CT   GERD (gastroesophageal reflux disease)    Hiatal hernia    Hyperglycemia 10/2012.   Hyperlipidemia    Hypertension    Myocardial infarction [facility]) 1994; 2011   Pneumonia 1946; 2015   Prostate  enlargement 07/2014   observed on CT   Tobacco abuse    Ventricular tachycardia ([place])    a. 08/2009 s/p BSX E110 Teligen 100 AICD, ser#: [id number];  b. 08/2008 VT req ATP - detection reprogrammed from 160 to 150. c. EPS and VT ablation by Dr. Lovena Le 12/21/2014   Past Surgical History:  Procedure Laterality Date   BIOPSY  12/21/2017   Procedure: BIOPSY;  Surgeon: [name], MD;  Location: [facility];  Service: Endoscopy;;   CATARACT EXTRACTION W/ INTRAOCULAR LENS  IMPLANT, BILATERAL Bilateral ~ 2011   COLONOSCOPY     COLONOSCOPY WITH PROPOFOL N/A 12/21/2017   Procedure: COLONOSCOPY WITH PROPOFOL;  Surgeon: [name], MD;  Location: [facility];  Service: Endoscopy;  Laterality: N/A;   ELECTROPHYSIOLOGIC STUDY N/A 12/21/2014   Procedure: V Tach Ablation;  Surgeon: [name], MD;  Location: [place] [facility];  Service: Cardiovascular;  Laterality: N/A;   ERCP N/A 11/16/2014   Procedure: ENDOSCOPIC RETROGRADE CHOLANGIOPANCREATOGRAPHY (ERCP);  Surgeon: [name], MD;  Location: [place];  Service: Endoscopy;  Laterality: N/A;   ESOPHAGOGASTRODUODENOSCOPY (EGD) WITH PROPOFOL N/A 12/21/2017   Procedure: ESOPHAGOGASTRODUODENOSCOPY (EGD) WITH PROPOFOL;  Surgeon: [name], MD;  [facility];  Service: Endoscopy;  Laterality: N/A;   EYE SURGERY  FOOT SURGERY Left 2005   "fixed bone that stuck out in my ankle area"   HEMORRHOID BANDING     IMPLANTABLE CARDIOVERTER DEFIBRILLATOR IMPLANT  09/06/09   BSX dual chamber ICD implanted in [place] for cardiac arrest 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   [place] Right ~ [place] N/A 03/19/2022   Procedure: LEFT HEART CATH AND CORONARY ANGIOGRAPHY;  Surgeon: Martinique, Peter M, MD;  Location: [place] CV [facility];  Service: Cardiovascular;  Laterality: N/A;   LEFT HEART CATHETERIZATION WITH CORONARY ANGIOGRAM N/A 11/25/2012   demonstrated EF 30%, inferior akinesis with mild hypokinesis of  all walls, patent LAD and RCA stents; ostial PDA with 80-90% obstruction with medical therapy recommended   MOHS SURGERY  2008   nose, skin graft   POLYPECTOMY  12/21/2017   Procedure: POLYPECTOMY;  Surgeon: [name], MD;  [facility];  Service: Endoscopy;;   RETINAL DETACHMENT SURGERY Right 2013   TENOLYSIS Right 12/21/2013   Procedure: TENOLYSIS FLEXOR CARPI RADIALIS ,DEBRIDEMENT RIGHT JOINT WRIST,DEBRIDEMENT SCAPHOTRAPEZIAL TRAPEZOID, REPAIR OF EXTENSOR HOOD;  Surgeon: Daryll Brod, MD;  Location: [facility];  Service: Orthopedics;  Laterality: Right;   TOE SURGERY Right 09/2019   3rd toe/hammer toe   V-TACH ABLATION  12/21/2014   VIDEO BRONCHOSCOPY Bilateral 01/09/2016   Procedure: VIDEO BRONCHOSCOPY WITHOUT FLUORO;  Surgeon: Juanito Doom, MD;  [facility];  Service: Cardiopulmonary;  Laterality: Bilateral;    Allergies  Allergies  Allergen Reactions   Sulfa Antibiotics Hives    EKGs/Labs/Other Studies Reviewed:   The following studies were reviewed today: ***  EKG:  EKG is *** ordered today.  The ekg ordered today demonstrates ***  Recent Labs: 04/01/2021: TSH 2.360 03/18/2022: ALT 18; B Natriuretic Peptide 90.1 03/20/2022: BUN 6; Creatinine, Ser 0.69; Hemoglobin 10.4; Platelets 164; Potassium 3.2; Sodium 134  Recent Lipid Panel    Component Value Date/Time   CHOL 162 03/19/2022 0205   CHOL 117 07/14/2021 1026   TRIG 75 03/19/2022 0205   HDL 44 03/19/2022 0205   HDL 47 07/14/2021 1026   CHOLHDL 3.7 03/19/2022 0205   VLDL 15 03/19/2022 0205   LDLCALC 103 (H) 03/19/2022 0205   LDLCALC 53 07/14/2021 1026   LDLCALC 67 03/01/2020 1344    Risk Assessment/Calculations:  {Does this patient have ATRIAL FIBRILLATION?[phone]}  Home Medications   No outpatient medications have been marked as taking for the 03/31/22 encounter (Appointment) with Elgie Collard, PA-C.     Review of Systems   ***   All other systems reviewed and  are otherwise negative except as noted above.  Physical Exam    VS:  There were no vitals taken for this visit. , BMI There is no height or weight on file to calculate BMI.  Wt Readings from Last 3 Encounters:  03/19/22 192 lb 3.9 oz (87.2 kg)  03/13/22 192 lb (87.1 kg)  03/12/22 192 lb (87.1 kg)     GEN: Well nourished, well developed, in no acute distress. HEENT: normal. Neck: Supple, no JVD, carotid bruits, or masses. Cardiac: ***RRR, no murmurs, rubs, or gallops. No clubbing, cyanosis, edema.  ***Radials/PT 2+ and equal bilaterally.  Respiratory:  ***Respirations regular and unlabored, clear to auscultation bilaterally. GI: Soft, nontender, nondistended. MS: No deformity or atrophy. Skin: Warm and dry, no rash. Neuro:  Strength and sensation are intact. Psych: Normal affect.  Assessment & Plan    VT status post ICD PAF  Chronic systolic CHF Hypertension CAD status post PCI Hyperlipidemia  No BP recorded.  {Refresh Note OR Click here to enter BP  :1}***    {The patient has an active order for outpatient cardiac rehabilitation.   Please indicate if the patient is ready to start. Do NOT delete this.  It will auto delete.  Refresh note, then sign.              Click here to document readiness and see contraindications.  :1}  Cardiac Rehabilitation Eligibility Assessment     Disposition: Follow up {follow up:15908} with Sanda Klein, MD or APP.  Signed, Elgie Collard, PA-C 03/29/2022, 3:36 PM [facility]

## 2022-03-31 ENCOUNTER — Ambulatory Visit: Payer: Medicare Other | Admitting: Physician Assistant

## 2022-03-31 DIAGNOSIS — I10 Essential (primary) hypertension: Secondary | ICD-10-CM

## 2022-03-31 DIAGNOSIS — E785 Hyperlipidemia, unspecified: Secondary | ICD-10-CM

## 2022-03-31 DIAGNOSIS — I472 Ventricular tachycardia, unspecified: Secondary | ICD-10-CM

## 2022-03-31 DIAGNOSIS — Z95810 Presence of automatic (implantable) cardiac defibrillator: Secondary | ICD-10-CM

## 2022-03-31 DIAGNOSIS — I25810 Atherosclerosis of coronary artery bypass graft(s) without angina pectoris: Secondary | ICD-10-CM

## 2022-03-31 DIAGNOSIS — I480 Paroxysmal atrial fibrillation: Secondary | ICD-10-CM

## 2022-03-31 DIAGNOSIS — I5022 Chronic systolic (congestive) heart failure: Secondary | ICD-10-CM

## 2022-04-01 NOTE — Telephone Encounter (Signed)
Reviewed with Dr. Lovena Le.  Nothing to do at this point.  Gen change 04/20/22.

## 2022-04-02 ENCOUNTER — Telehealth: Payer: Self-pay

## 2022-04-02 NOTE — Telephone Encounter (Signed)
Please have patient update orthopedic surgeon. He may want patient to continue PT services. PT is often very important after fractures to prevent weakness and future falls.

## 2022-04-02 NOTE — Telephone Encounter (Addendum)
Steven Guzman, PT with [facility] called nad stated that patient would like to postpone therapy sessions until his fracture heals.  Messsage routed to [name], NP

## 2022-04-03 ENCOUNTER — Encounter: Payer: Self-pay | Admitting: Physician Assistant

## 2022-04-03 ENCOUNTER — Ambulatory Visit: Payer: Medicare Other | Admitting: Internal Medicine

## 2022-04-03 ENCOUNTER — Ambulatory Visit: Payer: Medicare Other | Admitting: Physician Assistant

## 2022-04-03 VITALS — BP 110/68 | HR 72 | Ht 72.0 in | Wt 190.1 lb

## 2022-04-03 DIAGNOSIS — R1013 Epigastric pain: Secondary | ICD-10-CM | POA: Diagnosis not present

## 2022-04-03 DIAGNOSIS — K219 Gastro-esophageal reflux disease without esophagitis: Secondary | ICD-10-CM | POA: Diagnosis not present

## 2022-04-03 DIAGNOSIS — K449 Diaphragmatic hernia without obstruction or gangrene: Secondary | ICD-10-CM

## 2022-04-03 NOTE — Patient Instructions (Signed)
Stay on omeprazole '20mg'$  daily.   You may take Miralax 17grams twice daily with water.   If your symptoms get worse call us back and ask to speak with Dr Henrene Pastor or Amy Esterwood's nurse.   I appreciate the opportunity to care for you. Amy Esterwood, PA-C

## 2022-04-03 NOTE — Progress Notes (Signed)
Subjective:    Patient ID: Steven Guzman, male    DOB: [birth year], 82 y.o.   MRN: [id number]  HPI "Steven Guzman" is a pleasant 82 year old white male, established with Dr. Henrene Pastor.  He was last seen here in October 2022, with mildly elevated transaminases. Patient has history of multiple adenomatous colon polyps, last colonoscopy 2019, no follow-up planned due to age. He had also undergone EGD at that same time both done because of iron deficiency anemia and was found to have a large hiatal hernia with small erosions.  He had been placed on chronic iron therapy at that time. He has multiple comorbidities, and is chronically anticoagulated. Comes in today for evaluation of episodes of recurrent intense epigastric pain that radiates to his mid abdomen.  He has had about 3 of these episodes over the past 2 months.  The initial episode occurred in November, with abrupt onset of severe epigastric pain which radiate to to the mid abdomen below the umbilicus, pain was described as constant and rated an 8 or 9 out of 10.  He did not have any associated vomiting, no diaphoresis, no diarrhea.  EMS was called that he says during the ride in the ambulance the pain abruptly resolved.  He did have CT of the abdomen and pelvis done which was unrevealing for any acute abnormality in the abdomen. He had a second episode a couple of weeks later, again had CT of the abdomen and pelvis without any acute change however it did show several significant abnormalities including a stable 3.2 cm abdominal aortic aneurysm, that was described as a moderate to large hiatal hernia with intrathoracic gastric fundus, normal-appearing gallbladder, pneumobilia noted (patient had remote ERCP) there was a large incompletely visualized left inguinal hernia containing sigmoid colon no evidence of obstruction, sigmoid diverticulosis, and there was noted some circumferential wall thickening of the rectum.  Noted that evaluation of the low pelvis has  been significantly limited in this patient secondary to metallic streak artifact from adjacent hip arthroplasty.  The last episode occurred on December 25, was not as intense as the previous episodes perhaps 5-6 out of 10 lasted multiple hours and then resolved.  Again no associated vomiting nausea diaphoresis or diarrhea.  He is pleased that he has not had any recurrence.  In the interim he has also had other acute medical issues.  He had undergone cardiac cath on 03/19/2022 after complaints of chest pain and was found to have two-vessel obstructive coronary artery disease with the culprit lesion felt to be a 100% distal OM 3, and medical management was recommended.  He also had 2D echo at that same time that showed an EF of 25 to 30% with severely reduced left ventricular function moderate mitral regurgitation no AAS and normal pulmonary artery pressures. He has had several changes in meds, has been started on Entresto, Jardiance, blood pressure medicine was discontinued and his as needed Lasix was discontinued.  He had a fall in November and sustained a hip fracture from which she is recuperating from and then had gone to the emergency room on January 1 after a fall at home with right rib pain and was found to have right rib fractures.  He says that is still fairly painful but he is starting to feel a little bit better.  Underlying he has significant coronary artery disease as described above, history of V. tach status post ICD, hypertension, history of atrial fibrillation, chronic kidney disease stage III, congestive heart failure, COPD  Gold C and GERD. He is on low-dose omeprazole says he has not been having any problems with heartburn or indigestion. He had just recently started having some problems with constipation and is taking MiraLAX daily.  He did have a good bowel movement today, the first in 3 days and feels better.  He was not having issues with constipation until his meds changed in  December.  Review of Systems Pertinent positive and negative review of systems were noted in the above HPI section.  All other review of systems was otherwise negative.   Outpatient Encounter Medications as of 04/03/2022  Medication Sig   albuterol (PROVENTIL) (2.5 MG/3ML) 0.083% nebulizer solution USE 1 VIAL VIA NEBULIZER EVERY 6 HOURS AS NEEDED FOR WHEEZING OR SHORTNESS OF BREATH (Patient taking differently: Take 2.5 mg by nebulization See admin instructions. USE 1 VIAL VIA NEBULIZER EVERY 6 HOURS AS NEEDED FOR WHEEZING OR SHORTNESS OF BREATH)   amiodarone (PACERONE) 200 MG tablet TAKE ONE TABLET BY MOUTH DAILY (Patient taking differently: Take 200 mg by mouth daily.)   apixaban (ELIQUIS) 5 MG TABS tablet Take 1 tablet (5 mg total) by mouth 2 (two) times daily.   Artificial Tear Solution (SOOTHE XP OP) Place 2 drops into both eyes daily as needed (dry eyes).   ascorbic acid (VITAMIN C) 500 MG tablet Take 1 tablet by mouth daily.   aspirin EC 81 MG tablet Take 1 tablet (81 mg total) by mouth daily. Swallow whole.   bisacodyl (DULCOLAX) 5 MG EC tablet Take 5 mg by mouth.   buPROPion (WELLBUTRIN) 75 MG tablet TAKE ONE TABLET BY MOUTH DAILY (Patient taking differently: Take 75 mg by mouth daily at 6 PM.)   busPIRone (BUSPAR) 15 MG tablet Take 15 mg by mouth 2 (two) times daily. Patient taking 10 mg by mouth twice daily   calcium citrate (CALCITRATE - DOSED IN MG ELEMENTAL CALCIUM) 950 (200 Ca) MG tablet Take 200 mg of elemental calcium by mouth daily.   carvedilol (COREG) 3.125 MG tablet Take 1 tablet (3.125 mg total) by mouth 2 (two) times daily.   cetirizine (ZYRTEC) 10 MG tablet Take 1 tablet by mouth daily.   Cyanocobalamin (VITAMIN B-12) 5000 MCG SUBL Place under the tongue daily.   empagliflozin (JARDIANCE) 10 MG TABS tablet Take 1 tablet (10 mg total) by mouth daily.   escitalopram (LEXAPRO) 20 MG tablet Take 20 mg by mouth daily. Take 1/2 tablet (10 mg total ) by mouth daily   Fenofibric  Acid 35 MG TABS Take 35 mg by mouth daily.   fluticasone (FLONASE) 50 MCG/ACT nasal spray Place 2 sprays into both nostrils daily.   furosemide (LASIX) 20 MG tablet Take 1 tablet (20 mg total) by mouth daily as needed for fluid or edema.   gabapentin (NEURONTIN) 300 MG capsule Take 1 capsule (300 mg total) by mouth 2 (two) times daily.   guaiFENesin (MUCINEX) 600 MG 12 hr tablet Take 1 tablet by mouth 2 (two) times daily.   Ipratropium-Albuterol (COMBIVENT RESPIMAT) 20-100 MCG/ACT AERS respimat Inhale 1 puff into the lungs every 6 (six) hours. Shortness of breath or wheezing   IRON PO Take 1 tablet by mouth daily.   MAGNESIUM-OXIDE 400 (241.3 Mg) MG tablet TAKE 1 TABSULE BY MOUTH DAILY (Patient taking differently: Take 400 mg by mouth daily.)   mexiletine (MEXITIL) 200 MG capsule Take 1 capsule (200 mg total) by mouth 2 (two) times daily.   Multiple Vitamins-Minerals (CENTRUM ADULTS PO) Take by mouth daily.  mupirocin ointment (BACTROBAN) 2 % Apply 1 application. topically 2 (two) times daily. Apply to lower leg wounds daily prn   nitroGLYCERIN (NITROSTAT) 0.4 MG SL tablet Place 1 tablet (0.4 mg total) under the tongue every 5 (five) minutes as needed for chest pain. DISSOLVE 1 TABLET UNDER THE TONGUE EVERY 5 MINUTES FOR 3 DOSES   omeprazole (PRILOSEC) 20 MG capsule Take 20 mg by mouth daily.    polyethylene glycol powder (GLYCOLAX/MIRALAX) 17 GM/SCOOP powder Take 1 capful in 8 ounces of fluid each morning   Respiratory Therapy Supplies (FLUTTER) DEVI Use as directed (Patient taking differently: 1 each by Other route See admin instructions. Use as directed)   rosuvastatin (CRESTOR) 40 MG tablet Take 1 tablet (40 mg total) by mouth daily.   sacubitril-valsartan (ENTRESTO) 24-26 MG Take 1 tablet by mouth 2 (two) times daily.   Sennosides-Docusate Sodium (STOOL SOFTENER/LAXATIVE PO) Take 2 tablets by mouth daily.   Spacer/Aero-Holding Chambers ([place]) MISC optichamber [facility] (Patient  taking differently: 1 each by Other route See admin instructions. optichamber [facility])   STIOLTO RESPIMAT 2.5-2.5 MCG/ACT AERS Inhale 2 puffs into the lungs daily.   tamsulosin (FLOMAX) 0.4 MG CAPS capsule TAKE ONE CAPSULE BY MOUTH DAILY AFTER SUPPER (Patient taking differently: Take 0.4 mg by mouth See admin instructions. TAKE ONE CAPSULE BY MOUTH DAILY AFTER SUPPER)   No facility-administered encounter medications on file as of 04/03/2022.   Allergies  Allergen Reactions   Sulfa Antibiotics Hives   Patient Active Problem List   Diagnosis Date Noted   NSTEMI (non-ST elevated myocardial infarction) ([place]) 03/18/2022   Major depressive disorder with single episode, in partial remission ([place]) 03/05/2020   Paraseptal emphysema ([place]) 12/15/2018   Tobacco abuse 12/15/2018   Perianal abscess 11/15/2018   Left inguinal hernia 11/15/2018   Leg wound, right 11/15/2018   Weight loss 09/29/2018   Dark stools 09/29/2018   Lumbar trigger point syndrome 04/20/2018   Throat and mouth symptom 02/03/2018   Gastroesophageal reflux disease without esophagitis    Esophageal stricture    Gastritis and gastroduodenitis    Degenerative cervical spinal stenosis 10/27/2017   Carotid bruit 10/27/2017   B12 deficiency 08/25/2017   Anemia 06/10/2017   Right ankle pain 04/09/2017   BPH (benign prostatic hyperplasia) 12/31/2016   Dizzinesses 05/22/2016   Mass of throat 06/30/2015   Depression 05/24/2015   Atrial fibrillation ([place]) 03/15/2015   Routine general medical examination at a health care facility 05/12/2014   Hemoptysis 03/16/2014   COPD GOLD GRADE C 16/38/4665   Chronic systolic heart failure ([place]) 07/07/2013   CAD (coronary artery disease) 11/23/2012   Smokers' cough ([place]) 11/23/2012   Ventricular tachycardia ([place]) 11/23/2012   Essential hypertension 11/23/2012   Hyperlipidemia 11/23/2012   ICD (implantable cardioverter-defibrillator) in place 11/23/2012   Social History   Socioeconomic History    Marital status: Married    Spouse name: Not on file   Number of children: Not on file   Years of education: Not on file   Highest education level: Not on file  Occupational History   Occupation: Retired  Tobacco Use   Smoking status: Former    Packs/day: 1.00    Years: 55.00    Total pack years: 55.00    Types: Cigarettes   Smokeless tobacco: Never   Tobacco comments:    off/on, always ready to quit but does not work out  Scientific laboratory technician Use: Never used  Substance and Sexual Activity   Alcohol  use: Yes    Alcohol/week: 0.0 standard drinks of alcohol    Comment: occ   Drug use: No   Sexual activity: Not Currently  Other Topics Concern   Not on file  Social History Narrative   Not on file   Social Determinants of Health   Financial Resource Strain: Medium Risk (03/06/2021)   Overall Financial Resource Strain (CARDIA)    Difficulty of Paying Living Expenses: Somewhat hard  Food Insecurity: No Food Insecurity (03/24/2022)   Hunger Vital Sign    Worried About Running Out of Food in the Last Year: Never true    Ran Out of Food in the Last Year: Never true  Transportation Needs: No Transportation Needs (03/24/2022)   PRAPARE - Hydrologist (Medical): No    Lack of Transportation (Non-Medical): No  Physical Activity: Insufficiently Active (03/06/2021)   Exercise Vital Sign    Days of Exercise per Week: 4 days    Minutes of Exercise per Session: 20 min  Stress: Stress Concern Present (03/06/2021)   [place]    Feeling of Stress : Rather much  Social Connections: Moderately Isolated (03/06/2021)   Social Connection and Isolation Panel [NHANES]    Frequency of Communication with Friends and Family: More than three times a week    Frequency of Social Gatherings with Friends and Family: Once a week    Attends Religious Services: Never    Marine scientist or Organizations:  No    Attends Archivist Meetings: Never    Marital Status: Married  Human resources officer Violence: Not At Risk (03/19/2022)   Humiliation, Afraid, Rape, and Kick questionnaire    Fear of Current or Ex-Partner: No    Emotionally Abused: No    Physically Abused: No    Sexually Abused: No    Mr. Hardenbrook family history includes CAD in his father and mother; Heart attack in his brother; Hypertension in his brother, father, and mother.      Objective:    Vitals:   04/03/22 1328  BP: 110/68  Pulse: 72  SpO2: 93%    Physical Exam Well-developed well-nourished elderly white male in no acute distress.  Very pleasant height, Weight, 190 BMI 25.7  HEENT; nontraumatic normocephalic, EOMI, PE R LA, sclera anicteric. Oropharynx; not examined today Neck; supple, no JVD Cardiovascular; regular rate and rhythm with S1-S2, no murmur rub or gallop Pulmonary; Clear bilaterally Abdomen; soft, nontender, nondistended, no palpable mass or hepatosplenomegaly, bowel sounds are active, large left inguinal hernia Rectal; not done today Skin; benign exam, no jaundice rash or appreciable lesions Extremities; no clubbing cyanosis or edema skin warm and dry Neuro/Psych; alert and oriented x4, grossly nonfocal mood and affect appropriate        Assessment & Plan:   #86 82 year old white male with recent onset of recurrent episodes of intense epigastric pain radiating to the mid abdomen with 3 occurrences since November 2023.  Pain is constant while present, not associated with nausea vomiting diaphoresis or diarrhea.  Has had fairly abrupt resolution of pain with all 3 episodes, the last episode was March 23, 2022. ER visit with each of the prior episodes with CT imaging not showing any acute abnormality. However he has been shown on CT to have a large hiatal hernia and has demonstrated intrathoracic gastric fundus No evidence of gallbladder disease, and normal LFTs  I am concerned that he  may be having symptoms  secondary to his large hiatal hernia with intrathoracic component.  Question transient torsion or volvulus causing the symptoms.  #2 large left inguinal hernia containing sigmoid colon-patient is not had any issues with pain though he does have some symptoms from the hernia and expresses desire to have this fixed at some point 3 history of multiple adenomatous polyps-last colonoscopy 2019-no follow-up planned due to advanced age #4 GERD-symptoms well-controlled on low-dose omeprazole #5 recent cardiac cath December 2023 with two-vessel obstructive coronary artery disease-and patent LAD and RCA stents.  Disease to be managed medically currently  #6 very recent echo with significantly reduced EF at 25 to 30%-started Entresto #7 COPD Gold C #8.  History of atrial fibrillation on Eliquis #9.  Hypertension #10.  History of V. tach status post ICD-for battery change later this month #11-new constipation mild specked medication related #12-stable 3.2 cm abdominal aortic aneurysm  Plan; I do not think that barium swallow will change management at this point, Patient is not a candidate for surgical repair of the large hiatal hernia given his multiple comorbidities at this point. He will monitor his symptoms, he was encouraged to return to the emergency room should he have severe prolonged episodes as repeat imaging will be necessary. I have asked him to call back in a month or so with an update.  He was pleased today to have some plausible explanation for the symptoms, and states that he feels better after our conversation today. Continue omeprazole 20 mg p.o. every morning Continue MiraLAX 17 g in 8 ounces of water daily may use twice daily if needed.   Astryd Pearcy Genia Harold PA-C 04/03/2022   Cc: Yvonna Alanis, NP

## 2022-04-05 NOTE — Progress Notes (Signed)
Noted  

## 2022-04-06 ENCOUNTER — Ambulatory Visit: Payer: Medicare Other | Attending: Internal Medicine

## 2022-04-06 ENCOUNTER — Encounter: Payer: Self-pay | Admitting: Orthopedic Surgery

## 2022-04-06 ENCOUNTER — Other Ambulatory Visit: Payer: Self-pay | Admitting: Orthopedic Surgery

## 2022-04-06 DIAGNOSIS — Z95810 Presence of automatic (implantable) cardiac defibrillator: Secondary | ICD-10-CM

## 2022-04-06 DIAGNOSIS — I5022 Chronic systolic (congestive) heart failure: Secondary | ICD-10-CM

## 2022-04-06 DIAGNOSIS — S2239XD Fracture of one rib, unspecified side, subsequent encounter for fracture with routine healing: Secondary | ICD-10-CM

## 2022-04-06 DIAGNOSIS — I4891 Unspecified atrial fibrillation: Secondary | ICD-10-CM

## 2022-04-06 MED ORDER — HYDROCODONE-ACETAMINOPHEN 5-325 MG PO TABS: 1.0000 | ORAL_TABLET | Freq: Two times a day (BID) | ORAL | 0 refills | Status: DC | PRN

## 2022-04-06 MED ORDER — LIDOCAINE 5 % EX PTCH: 1.0000 | MEDICATED_PATCH | CUTANEOUS | 0 refills | Status: DC

## 2022-04-06 NOTE — Telephone Encounter (Signed)
Left message for patient to call office.  

## 2022-04-07 LAB — CBC WITH DIFFERENTIAL/PLATELET
Basophils Absolute: 0 10*3/uL (ref 0.0–0.2)
Basos: 1 %
EOS (ABSOLUTE): 0.1 10*3/uL (ref 0.0–0.4)
Eos: 2 %
Hematocrit: 36.9 % — ABNORMAL LOW (ref 37.5–51.0)
Hemoglobin: 12.3 g/dL — ABNORMAL LOW (ref 13.0–17.7)
Immature Grans (Abs): 0.1 10*3/uL (ref 0.0–0.1)
Immature Granulocytes: 2 %
Lymphocytes Absolute: 0.9 10*3/uL (ref 0.7–3.1)
Lymphs: 23 %
MCH: 33.7 pg — ABNORMAL HIGH (ref 26.6–33.0)
MCHC: 33.3 g/dL (ref 31.5–35.7)
MCV: 101 fL — ABNORMAL HIGH (ref 79–97)
Monocytes Absolute: 0.4 10*3/uL (ref 0.1–0.9)
Monocytes: 9 %
Neutrophils Absolute: 2.5 10*3/uL (ref 1.4–7.0)
Neutrophils: 63 %
Platelets: 143 10*3/uL — ABNORMAL LOW (ref 150–450)
RBC: 3.65 x10E6/uL — ABNORMAL LOW (ref 4.14–5.80)
RDW: 15.7 % — ABNORMAL HIGH (ref 11.6–15.4)
WBC: 4 10*3/uL (ref 3.4–10.8)

## 2022-04-07 LAB — BASIC METABOLIC PANEL
BUN/Creatinine Ratio: 8 — ABNORMAL LOW (ref 10–24)
BUN: 6 mg/dL — ABNORMAL LOW (ref 8–27)
CO2: 23 mmol/L (ref 20–29)
Calcium: 9.1 mg/dL (ref 8.6–10.2)
Chloride: 103 mmol/L (ref 96–106)
Creatinine, Ser: 0.72 mg/dL — ABNORMAL LOW (ref 0.76–1.27)
Glucose: 129 mg/dL — ABNORMAL HIGH (ref 70–99)
Potassium: 4.2 mmol/L (ref 3.5–5.2)
Sodium: 139 mmol/L (ref 134–144)
eGFR: 91 mL/min/{1.73_m2} (ref >59)

## 2022-04-08 ENCOUNTER — Other Ambulatory Visit: Payer: Self-pay

## 2022-04-08 ENCOUNTER — Other Ambulatory Visit: Payer: Self-pay | Admitting: Interventional Cardiology

## 2022-04-08 ENCOUNTER — Emergency Department (HOSPITAL_COMMUNITY): Payer: Medicare Other

## 2022-04-08 ENCOUNTER — Inpatient Hospital Stay (HOSPITAL_COMMUNITY)
Admission: EM | Admit: 2022-04-08 | Discharge: 2022-04-12 | DRG: 351 | Disposition: A | Discharge status: Home / Self Care | Payer: Medicare Other | Attending: Internal Medicine | Admitting: Internal Medicine

## 2022-04-08 ENCOUNTER — Ambulatory Visit: Payer: Medicare Other | Admitting: Cardiovascular Disease

## 2022-04-08 DIAGNOSIS — Z8249 Family history of ischemic heart disease and other diseases of the circulatory system: Secondary | ICD-10-CM

## 2022-04-08 DIAGNOSIS — Z882 Allergy status to sulfonamides status: Secondary | ICD-10-CM

## 2022-04-08 DIAGNOSIS — Z7901 Long term (current) use of anticoagulants: Secondary | ICD-10-CM

## 2022-04-08 DIAGNOSIS — I2510 Atherosclerotic heart disease of native coronary artery without angina pectoris: Secondary | ICD-10-CM | POA: Diagnosis present

## 2022-04-08 DIAGNOSIS — I5022 Chronic systolic (congestive) heart failure: Secondary | ICD-10-CM | POA: Diagnosis present

## 2022-04-08 DIAGNOSIS — Z961 Presence of intraocular lens: Secondary | ICD-10-CM | POA: Diagnosis present

## 2022-04-08 DIAGNOSIS — Z87891 Personal history of nicotine dependence: Secondary | ICD-10-CM

## 2022-04-08 DIAGNOSIS — K56600 Partial intestinal obstruction, unspecified as to cause: Secondary | ICD-10-CM | POA: Diagnosis present

## 2022-04-08 DIAGNOSIS — Z85828 Personal history of other malignant neoplasm of skin: Secondary | ICD-10-CM

## 2022-04-08 DIAGNOSIS — Z7982 Long term (current) use of aspirin: Secondary | ICD-10-CM

## 2022-04-08 DIAGNOSIS — K4030 Unilateral inguinal hernia, with obstruction, without gangrene, not specified as recurrent: Secondary | ICD-10-CM | POA: Diagnosis not present

## 2022-04-08 DIAGNOSIS — I130 Hypertensive heart and chronic kidney disease with heart failure and stage 1 through stage 4 chronic kidney disease, or unspecified chronic kidney disease: Secondary | ICD-10-CM | POA: Diagnosis present

## 2022-04-08 DIAGNOSIS — E785 Hyperlipidemia, unspecified: Secondary | ICD-10-CM | POA: Diagnosis present

## 2022-04-08 DIAGNOSIS — I7143 Infrarenal abdominal aortic aneurysm, without rupture: Secondary | ICD-10-CM | POA: Diagnosis present

## 2022-04-08 DIAGNOSIS — D696 Thrombocytopenia, unspecified: Secondary | ICD-10-CM | POA: Diagnosis present

## 2022-04-08 DIAGNOSIS — K4090 Unilateral inguinal hernia, without obstruction or gangrene, not specified as recurrent: Secondary | ICD-10-CM

## 2022-04-08 DIAGNOSIS — Z79899 Other long term (current) drug therapy: Secondary | ICD-10-CM

## 2022-04-08 DIAGNOSIS — Z9841 Cataract extraction status, right eye: Secondary | ICD-10-CM

## 2022-04-08 DIAGNOSIS — E871 Hypo-osmolality and hyponatremia: Secondary | ICD-10-CM | POA: Diagnosis not present

## 2022-04-08 DIAGNOSIS — I10 Essential (primary) hypertension: Secondary | ICD-10-CM | POA: Diagnosis present

## 2022-04-08 DIAGNOSIS — J449 Chronic obstructive pulmonary disease, unspecified: Secondary | ICD-10-CM | POA: Diagnosis present

## 2022-04-08 DIAGNOSIS — Z9842 Cataract extraction status, left eye: Secondary | ICD-10-CM

## 2022-04-08 DIAGNOSIS — Z7984 Long term (current) use of oral hypoglycemic drugs: Secondary | ICD-10-CM

## 2022-04-08 DIAGNOSIS — Z8674 Personal history of sudden cardiac arrest: Secondary | ICD-10-CM

## 2022-04-08 DIAGNOSIS — I472 Ventricular tachycardia, unspecified: Secondary | ICD-10-CM

## 2022-04-08 DIAGNOSIS — I712 Thoracic aortic aneurysm, without rupture, unspecified: Secondary | ICD-10-CM | POA: Diagnosis present

## 2022-04-08 DIAGNOSIS — I252 Old myocardial infarction: Secondary | ICD-10-CM

## 2022-04-08 DIAGNOSIS — Z86718 Personal history of other venous thrombosis and embolism: Secondary | ICD-10-CM

## 2022-04-08 DIAGNOSIS — Z8616 Personal history of COVID-19: Secondary | ICD-10-CM

## 2022-04-08 DIAGNOSIS — F419 Anxiety disorder, unspecified: Secondary | ICD-10-CM | POA: Diagnosis present

## 2022-04-08 DIAGNOSIS — I714 Abdominal aortic aneurysm, without rupture, unspecified: Secondary | ICD-10-CM | POA: Diagnosis present

## 2022-04-08 DIAGNOSIS — D72819 Decreased white blood cell count, unspecified: Secondary | ICD-10-CM | POA: Diagnosis present

## 2022-04-08 DIAGNOSIS — I4891 Unspecified atrial fibrillation: Secondary | ICD-10-CM | POA: Diagnosis present

## 2022-04-08 DIAGNOSIS — K219 Gastro-esophageal reflux disease without esophagitis: Secondary | ICD-10-CM | POA: Diagnosis present

## 2022-04-08 DIAGNOSIS — N183 Chronic kidney disease, stage 3 unspecified: Secondary | ICD-10-CM | POA: Diagnosis present

## 2022-04-08 DIAGNOSIS — F32A Depression, unspecified: Secondary | ICD-10-CM | POA: Diagnosis present

## 2022-04-08 DIAGNOSIS — I480 Paroxysmal atrial fibrillation: Secondary | ICD-10-CM | POA: Diagnosis present

## 2022-04-08 DIAGNOSIS — Z95810 Presence of automatic (implantable) cardiac defibrillator: Secondary | ICD-10-CM | POA: Diagnosis present

## 2022-04-08 DIAGNOSIS — E876 Hypokalemia: Secondary | ICD-10-CM | POA: Diagnosis not present

## 2022-04-08 DIAGNOSIS — D539 Nutritional anemia, unspecified: Secondary | ICD-10-CM | POA: Diagnosis not present

## 2022-04-08 LAB — COMPREHENSIVE METABOLIC PANEL
ALT: 23 U/L (ref 0–44)
AST: 25 U/L (ref 15–41)
Albumin: 3.6 g/dL (ref 3.5–5.0)
Alkaline Phosphatase: 63 U/L (ref 38–126)
Anion gap: 10 (ref 5–15)
BUN: 9 mg/dL (ref 8–23)
CO2: 27 mmol/L (ref 22–32)
Calcium: 9.4 mg/dL (ref 8.9–10.3)
Chloride: 99 mmol/L (ref 98–111)
Creatinine, Ser: 0.75 mg/dL (ref 0.61–1.24)
GFR, Estimated: >60 mL/min (ref >60)
Glucose, Bld: 92 mg/dL (ref 70–99)
Potassium: 3.9 mmol/L (ref 3.5–5.1)
Sodium: 136 mmol/L (ref 135–145)
Total Bilirubin: 0.8 mg/dL (ref 0.3–1.2)
Total Protein: 7 g/dL (ref 6.5–8.1)

## 2022-04-08 LAB — LIPASE, BLOOD: Lipase: 37 U/L (ref 11–51)

## 2022-04-08 LAB — CBC WITH DIFFERENTIAL/PLATELET
Abs Immature Granulocytes: 0.09 10*3/uL — ABNORMAL HIGH (ref 0.00–0.07)
Basophils Absolute: 0 10*3/uL (ref 0.0–0.1)
Basophils Relative: 1 %
Eosinophils Absolute: 0.1 10*3/uL (ref 0.0–0.5)
Eosinophils Relative: 1 %
HCT: 39.9 % (ref 39.0–52.0)
Hemoglobin: 12.9 g/dL — ABNORMAL LOW (ref 13.0–17.0)
Immature Granulocytes: 2 %
Lymphocytes Relative: 20 %
Lymphs Abs: 1 10*3/uL (ref 0.7–4.0)
MCH: 33.9 pg (ref 26.0–34.0)
MCHC: 32.3 g/dL (ref 30.0–36.0)
MCV: 105 fL — ABNORMAL HIGH (ref 80.0–100.0)
Monocytes Absolute: 0.5 10*3/uL (ref 0.1–1.0)
Monocytes Relative: 9 %
Neutro Abs: 3.4 10*3/uL (ref 1.7–7.7)
Neutrophils Relative %: 67 %
Platelets: 185 10*3/uL (ref 150–400)
RBC: 3.8 MIL/uL — ABNORMAL LOW (ref 4.22–5.81)
RDW: 17.2 % — ABNORMAL HIGH (ref 11.5–15.5)
WBC: 5 10*3/uL (ref 4.0–10.5)
nRBC: 0 % (ref 0.0–0.2)

## 2022-04-08 MED ORDER — IOHEXOL 350 MG/ML SOLN
75.0000 mL | Freq: Once | INTRAVENOUS | Status: AC | PRN
  Administered 2022-04-08: 75 mL via INTRAVENOUS

## 2022-04-08 NOTE — ED Triage Notes (Signed)
Pt c/o recurrent abdominal pain that started earlier today. Initially mid abdomen and left groin area. Pt states concern for inguinal hernia. Denies NVD.

## 2022-04-08 NOTE — ED Provider Triage Note (Signed)
Emergency Medicine Provider Triage Evaluation Note  Steven Guzman , a 82 y.o. male  was evaluated in triage.  Pt complains of left-sided abdominal pain that radiates to groin.  History of inguinal hernia.  Had a bowel movement earlier today.  No nausea or vomiting.  Review of Systems  Positive: Abdominal pain Negative: vomiting  Physical Exam  BP 131/82   Pulse 72   Temp 98.2 F (36.8 C) (Oral)   Resp 20   Ht 6' (1.829 m)   Wt 86.2 kg   SpO2 96%   BMI 25.77 kg/m  Gen:   Awake, no distress   Resp:  Normal effort  MSK:   Moves extremities without difficulty  Other:  Large left inguinal hernia  Medical Decision Making  Medically screening exam initiated at 8:33 PM.  Appropriate orders placed.  [name] was informed that the remainder of the evaluation will be completed by another provider, this initial triage assessment does not replace that evaluation, and the importance of remaining in the ED until their evaluation is complete.  Labs Ct abdomen   Karie Kirks 04/08/22 2034

## 2022-04-09 DIAGNOSIS — I480 Paroxysmal atrial fibrillation: Secondary | ICD-10-CM | POA: Diagnosis not present

## 2022-04-09 DIAGNOSIS — I714 Abdominal aortic aneurysm, without rupture, unspecified: Secondary | ICD-10-CM | POA: Diagnosis present

## 2022-04-09 DIAGNOSIS — K4030 Unilateral inguinal hernia, with obstruction, without gangrene, not specified as recurrent: Secondary | ICD-10-CM | POA: Diagnosis present

## 2022-04-09 DIAGNOSIS — D539 Nutritional anemia, unspecified: Secondary | ICD-10-CM | POA: Diagnosis not present

## 2022-04-09 DIAGNOSIS — I255 Ischemic cardiomyopathy: Secondary | ICD-10-CM | POA: Diagnosis not present

## 2022-04-09 DIAGNOSIS — Z01810 Encounter for preprocedural cardiovascular examination: Secondary | ICD-10-CM

## 2022-04-09 DIAGNOSIS — K4090 Unilateral inguinal hernia, without obstruction or gangrene, not specified as recurrent: Secondary | ICD-10-CM

## 2022-04-09 DIAGNOSIS — E871 Hypo-osmolality and hyponatremia: Secondary | ICD-10-CM | POA: Diagnosis not present

## 2022-04-09 DIAGNOSIS — I5022 Chronic systolic (congestive) heart failure: Secondary | ICD-10-CM

## 2022-04-09 DIAGNOSIS — Z85828 Personal history of other malignant neoplasm of skin: Secondary | ICD-10-CM | POA: Diagnosis not present

## 2022-04-09 DIAGNOSIS — E876 Hypokalemia: Secondary | ICD-10-CM | POA: Diagnosis not present

## 2022-04-09 DIAGNOSIS — D696 Thrombocytopenia, unspecified: Secondary | ICD-10-CM | POA: Diagnosis present

## 2022-04-09 DIAGNOSIS — I712 Thoracic aortic aneurysm, without rupture, unspecified: Secondary | ICD-10-CM | POA: Diagnosis present

## 2022-04-09 DIAGNOSIS — Z8616 Personal history of COVID-19: Secondary | ICD-10-CM | POA: Diagnosis not present

## 2022-04-09 DIAGNOSIS — Z7901 Long term (current) use of anticoagulants: Secondary | ICD-10-CM | POA: Diagnosis not present

## 2022-04-09 DIAGNOSIS — I2510 Atherosclerotic heart disease of native coronary artery without angina pectoris: Secondary | ICD-10-CM | POA: Diagnosis not present

## 2022-04-09 DIAGNOSIS — D72819 Decreased white blood cell count, unspecified: Secondary | ICD-10-CM | POA: Diagnosis present

## 2022-04-09 DIAGNOSIS — E785 Hyperlipidemia, unspecified: Secondary | ICD-10-CM | POA: Diagnosis present

## 2022-04-09 DIAGNOSIS — K56609 Unspecified intestinal obstruction, unspecified as to partial versus complete obstruction: Secondary | ICD-10-CM | POA: Diagnosis not present

## 2022-04-09 DIAGNOSIS — I252 Old myocardial infarction: Secondary | ICD-10-CM | POA: Diagnosis not present

## 2022-04-09 DIAGNOSIS — K56600 Partial intestinal obstruction, unspecified as to cause: Secondary | ICD-10-CM | POA: Diagnosis present

## 2022-04-09 DIAGNOSIS — J449 Chronic obstructive pulmonary disease, unspecified: Secondary | ICD-10-CM

## 2022-04-09 DIAGNOSIS — I482 Chronic atrial fibrillation, unspecified: Secondary | ICD-10-CM | POA: Diagnosis not present

## 2022-04-09 DIAGNOSIS — Z87891 Personal history of nicotine dependence: Secondary | ICD-10-CM | POA: Diagnosis not present

## 2022-04-09 DIAGNOSIS — Z5181 Encounter for therapeutic drug level monitoring: Secondary | ICD-10-CM | POA: Diagnosis not present

## 2022-04-09 DIAGNOSIS — I10 Essential (primary) hypertension: Secondary | ICD-10-CM

## 2022-04-09 DIAGNOSIS — Z95810 Presence of automatic (implantable) cardiac defibrillator: Secondary | ICD-10-CM | POA: Diagnosis not present

## 2022-04-09 DIAGNOSIS — F32A Depression, unspecified: Secondary | ICD-10-CM | POA: Diagnosis present

## 2022-04-09 DIAGNOSIS — N183 Chronic kidney disease, stage 3 unspecified: Secondary | ICD-10-CM | POA: Diagnosis present

## 2022-04-09 DIAGNOSIS — I130 Hypertensive heart and chronic kidney disease with heart failure and stage 1 through stage 4 chronic kidney disease, or unspecified chronic kidney disease: Secondary | ICD-10-CM | POA: Diagnosis present

## 2022-04-09 DIAGNOSIS — Z8674 Personal history of sudden cardiac arrest: Secondary | ICD-10-CM | POA: Diagnosis not present

## 2022-04-09 DIAGNOSIS — Z86718 Personal history of other venous thrombosis and embolism: Secondary | ICD-10-CM | POA: Diagnosis not present

## 2022-04-09 LAB — URINALYSIS, ROUTINE W REFLEX MICROSCOPIC
Bilirubin Urine: NEGATIVE
Glucose, UA: >=500 mg/dL — AB
Hgb urine dipstick: NEGATIVE
Ketones, ur: NEGATIVE mg/dL
Nitrite: NEGATIVE
Protein, ur: NEGATIVE mg/dL
Specific Gravity, Urine: 1.028 (ref 1.005–1.030)
pH: 5 (ref 5.0–8.0)

## 2022-04-09 LAB — APTT: aPTT: 44 seconds — ABNORMAL HIGH (ref 24–36)

## 2022-04-09 LAB — HEPARIN LEVEL (UNFRACTIONATED): Heparin Unfractionated: 0.72 IU/mL — ABNORMAL HIGH (ref 0.30–0.70)

## 2022-04-09 MED ORDER — GABAPENTIN 300 MG PO CAPS
300.0000 mg | ORAL_CAPSULE | Freq: Two times a day (BID) | ORAL | Status: DC
  Administered 2022-04-09 – 2022-04-12 (×6): 300 mg via ORAL
  Filled 2022-04-09 (×6): qty 1

## 2022-04-09 MED ORDER — AMIODARONE HCL 200 MG PO TABS
200.0000 mg | ORAL_TABLET | Freq: Every day | ORAL | Status: DC
  Administered 2022-04-09 – 2022-04-12 (×4): 200 mg via ORAL
  Filled 2022-04-09 (×4): qty 1

## 2022-04-09 MED ORDER — LORATADINE 10 MG PO TABS
10.0000 mg | ORAL_TABLET | Freq: Every day | ORAL | Status: DC
  Administered 2022-04-09 – 2022-04-12 (×4): 10 mg via ORAL
  Filled 2022-04-09 (×4): qty 1

## 2022-04-09 MED ORDER — ROSUVASTATIN CALCIUM 20 MG PO TABS
40.0000 mg | ORAL_TABLET | Freq: Every day | ORAL | Status: DC
  Administered 2022-04-09 – 2022-04-11 (×3): 40 mg via ORAL
  Filled 2022-04-09 (×3): qty 2

## 2022-04-09 MED ORDER — ALBUTEROL SULFATE (2.5 MG/3ML) 0.083% IN NEBU: 2.5000 mg | INHALATION_SOLUTION | Freq: Four times a day (QID) | RESPIRATORY_TRACT | Status: DC | PRN

## 2022-04-09 MED ORDER — MAGNESIUM OXIDE -MG SUPPLEMENT 400 (240 MG) MG PO TABS
400.0000 mg | ORAL_TABLET | Freq: Every day | ORAL | Status: DC
  Administered 2022-04-09 – 2022-04-12 (×4): 400 mg via ORAL
  Filled 2022-04-09 (×4): qty 1

## 2022-04-09 MED ORDER — SOOTHE XP OP SOLN: Freq: Every day | OPHTHALMIC | Status: DC | PRN

## 2022-04-09 MED ORDER — UMECLIDINIUM BROMIDE 62.5 MCG/ACT IN AEPB
1.0000 | INHALATION_SPRAY | Freq: Every day | RESPIRATORY_TRACT | Status: DC
  Administered 2022-04-10 – 2022-04-12 (×3): 1 via RESPIRATORY_TRACT
  Filled 2022-04-09: qty 7

## 2022-04-09 MED ORDER — ACETAMINOPHEN 500 MG PO TABS
1000.0000 mg | ORAL_TABLET | ORAL | Status: AC
  Administered 2022-04-10: 1000 mg via ORAL
  Filled 2022-04-09: qty 2

## 2022-04-09 MED ORDER — BUSPIRONE HCL 15 MG PO TABS
15.0000 mg | ORAL_TABLET | Freq: Two times a day (BID) | ORAL | Status: DC
  Administered 2022-04-09 – 2022-04-12 (×6): 15 mg via ORAL
  Filled 2022-04-09 (×6): qty 1

## 2022-04-09 MED ORDER — ESCITALOPRAM OXALATE 10 MG PO TABS
10.0000 mg | ORAL_TABLET | Freq: Every day | ORAL | Status: DC
  Administered 2022-04-09 – 2022-04-10 (×2): 10 mg via ORAL
  Filled 2022-04-09 (×2): qty 1

## 2022-04-09 MED ORDER — BISACODYL 5 MG PO TBEC: 5.0000 mg | DELAYED_RELEASE_TABLET | Freq: Every day | ORAL | Status: DC | PRN

## 2022-04-09 MED ORDER — HEPARIN (PORCINE) 25000 UT/250ML-% IV SOLN
1250.0000 [IU]/h | INTRAVENOUS | Status: DC
  Administered 2022-04-09: 1250 [IU]/h via INTRAVENOUS
  Filled 2022-04-09: qty 250

## 2022-04-09 MED ORDER — TAMSULOSIN HCL 0.4 MG PO CAPS
0.4000 mg | ORAL_CAPSULE | Freq: Every day | ORAL | Status: DC
  Administered 2022-04-09 – 2022-04-11 (×3): 0.4 mg via ORAL
  Filled 2022-04-09 (×3): qty 1

## 2022-04-09 MED ORDER — SACUBITRIL-VALSARTAN 24-26 MG PO TABS
1.0000 | ORAL_TABLET | Freq: Two times a day (BID) | ORAL | Status: DC
  Administered 2022-04-09 – 2022-04-12 (×5): 1 via ORAL
  Filled 2022-04-09 (×7): qty 1

## 2022-04-09 MED ORDER — FLUTICASONE PROPIONATE 50 MCG/ACT NA SUSP
2.0000 | Freq: Every day | NASAL | Status: DC
  Administered 2022-04-09 – 2022-04-12 (×4): 2 via NASAL
  Filled 2022-04-09: qty 16

## 2022-04-09 MED ORDER — FENOFIBRATE 54 MG PO TABS
54.0000 mg | ORAL_TABLET | Freq: Every day | ORAL | Status: DC
  Administered 2022-04-09 – 2022-04-12 (×4): 54 mg via ORAL
  Filled 2022-04-09 (×4): qty 1

## 2022-04-09 MED ORDER — PANTOPRAZOLE SODIUM 40 MG PO TBEC
40.0000 mg | DELAYED_RELEASE_TABLET | Freq: Every day | ORAL | Status: DC
  Administered 2022-04-09 – 2022-04-12 (×4): 40 mg via ORAL
  Filled 2022-04-09 (×4): qty 1

## 2022-04-09 MED ORDER — CARVEDILOL 3.125 MG PO TABS
3.1250 mg | ORAL_TABLET | Freq: Two times a day (BID) | ORAL | Status: DC
  Administered 2022-04-09 – 2022-04-12 (×5): 3.125 mg via ORAL
  Filled 2022-04-09 (×6): qty 1

## 2022-04-09 MED ORDER — EMPAGLIFLOZIN 10 MG PO TABS
10.0000 mg | ORAL_TABLET | Freq: Every day | ORAL | Status: DC
  Administered 2022-04-09: 10 mg via ORAL
  Filled 2022-04-09 (×2): qty 1

## 2022-04-09 MED ORDER — HEPARIN (PORCINE) 25000 UT/250ML-% IV SOLN: 1450.0000 [IU]/h | INTRAVENOUS | Status: DC

## 2022-04-09 MED ORDER — ACETAMINOPHEN 650 MG RE SUPP: 650.0000 mg | Freq: Four times a day (QID) | RECTAL | Status: DC | PRN

## 2022-04-09 MED ORDER — ACETAMINOPHEN 325 MG PO TABS
650.0000 mg | ORAL_TABLET | Freq: Four times a day (QID) | ORAL | Status: DC | PRN
  Administered 2022-04-11: 650 mg via ORAL
  Filled 2022-04-09 (×2): qty 2

## 2022-04-09 MED ORDER — MORPHINE SULFATE (PF) 2 MG/ML IV SOLN: 2.0000 mg | INTRAVENOUS | Status: DC | PRN

## 2022-04-09 MED ORDER — ARFORMOTEROL TARTRATE 15 MCG/2ML IN NEBU
15.0000 ug | INHALATION_SOLUTION | Freq: Two times a day (BID) | RESPIRATORY_TRACT | Status: DC
  Administered 2022-04-09 – 2022-04-10 (×3): 15 ug via RESPIRATORY_TRACT
  Filled 2022-04-09 (×4): qty 2

## 2022-04-09 MED ORDER — MEXILETINE HCL 200 MG PO CAPS
200.0000 mg | ORAL_CAPSULE | Freq: Two times a day (BID) | ORAL | Status: DC
  Administered 2022-04-09 – 2022-04-12 (×5): 200 mg via ORAL
  Filled 2022-04-09 (×8): qty 1

## 2022-04-09 MED ORDER — HYDROCODONE-ACETAMINOPHEN 5-325 MG PO TABS
0.5000 | ORAL_TABLET | Freq: Two times a day (BID) | ORAL | Status: DC | PRN
  Administered 2022-04-11 (×3): 0.5 via ORAL
  Filled 2022-04-09 (×3): qty 1

## 2022-04-09 MED ORDER — FUROSEMIDE 20 MG PO TABS: 20.0000 mg | ORAL_TABLET | Freq: Every day | ORAL | Status: DC | PRN

## 2022-04-09 NOTE — H&P (Signed)
History and Physical    Patient: Steven Guzman:[id number] DOB: Jan 30, 1941 DOA: 04/08/2022 DOS: the patient was seen and examined on 04/09/2022 PCP: Yvonna Alanis, NP  Patient coming from: Home  Chief Complaint:  Chief Complaint  Patient presents with   Abdominal Pain   HPI: Steven Guzman is a 82 y.o. male with medical history significant of hypertension, hyperlipidemia, CAD s/p inferior MI/cardiac arrest 1995 and PCI of LAD/RCA in 2011,  heart failure with reduced EF, ischemic cardiomyopathy  s/p AICD, VT  s/p ablation 2016, PAF on Eliquis, COPD, CKD stage III, and AAA who presented with complaints of abdominal pain yesterday.  He started having pain in the middle of his stomach, but also into his groin.  Sitting seem to help, but standing made symptoms worse.  Pain also seemed to radiate to his back.  Patient notes that symptoms have been intermittently occurring since back in November 2023.  On his second visit to the emergency department he was told that the left inguinal hernia was the likely cause of his symptoms and advised follow-up with general surgery.  Patient notes that bulge had been getting larger.  He reports he has been able to still have bowel movements.  Denies having any fever, chest pain, shortness of breath, leg swelling, nausea, vomiting, or diarrhea.  He had just recently been hospitalized 02/2022 with NSTEMI heart cath where EF was noted to be 25-30% with grade 1 diastolic dysfunction.  Patient had underwent cardiac cath which noted two-vessel obstructive CAD with 100% occlusion of the distal OMB and 80% stenosis of the RPDA.  He was noted to have patent stents of the LAD and RCA and medical management had been recommended.  Prior to that patient had been hospitalized after having a fall at [facility] in 01/2022 found to have a right femoral neck fracture undergoing right hemiarthroplasty.  He has been able to get around with use of cane since that  procedure.  In the emergency department patient was noted to have stable vital signs.  Labs were relatively unremarkable.  CT scan of the abdomen pelvis revealed a large left inguinal hernia containing sigmoid colon and a loop of small bowel with the herniated loop of small bowel mildly distended and containing feculent material compatible with partial obstruction.  Urinalysis noted trace leukocytes, few bacteria, and 21-50 WBCs.  General surgery has been consulted as well as  Review of Systems: As mentioned in the history of present illness. All other systems reviewed and are negative. Past Medical History:  Diagnosis Date   AICD (automatic cardioverter/defibrillator) present    Aneurysm ([place])    a. Aneurysmal infrarenal aorta up to 33 mm on CT 10/2014, recommended f/u due 10/2017   Anginal pain ([place])    Anxiety    Basal cell carcinoma of nose    S/P MOHS   Biliary acute pancreatitis    CAD (coronary artery disease)    a. s/p MI in 1994 with PCI to LAD at that time b. cath 10/2012 demonstrated EF 30%, inferior akinesis with mild hypokinesis of all walls, patent LAD and RCA stents; ostial PDA with 80-90% obstruction with medical therapy recommended    Chronic systolic CHF (congestive heart failure) (HCC)    EF 30 to 35 % as of 09/2014.    CKD (chronic kidney disease), stage III ([place])    Complication of anesthesia 10/2014   "had to have defibrillator w/ERCP"   COPD (chronic obstructive pulmonary disease) ([place])  a. followed by pulmonary, COPD GOLD stage II   Depression    Diverticulosis of colon 07/2014   noted on CT   GERD (gastroesophageal reflux disease)    Hiatal hernia    Hyperglycemia 10/2012.   Hyperlipidemia    Hypertension    Myocardial infarction [facility]) 1994; 2011   Pneumonia 1946; 2015   Prostate enlargement 07/2014   observed on CT   Tobacco abuse    Ventricular tachycardia ([place])    a. 08/2009 s/p BSX E110 Teligen 100 AICD, ser#: [id number];  b. 08/2008 VT req ATP - detection  reprogrammed from 160 to 150. c. EPS and VT ablation by Dr. Lovena Le 12/21/2014   Past Surgical History:  Procedure Laterality Date   BIOPSY  12/21/2017   Procedure: BIOPSY;  Surgeon: [name], MD;  Location: [facility];  Service: Endoscopy;;   CATARACT EXTRACTION W/ INTRAOCULAR LENS  IMPLANT, BILATERAL Bilateral ~ 2011   COLONOSCOPY     COLONOSCOPY WITH PROPOFOL N/A 12/21/2017   Procedure: COLONOSCOPY WITH PROPOFOL;  Surgeon: [name], MD;  Location: [facility];  Service: Endoscopy;  Laterality: N/A;   ELECTROPHYSIOLOGIC STUDY N/A 12/21/2014   Procedure: V Tach Ablation;  Surgeon: [name], MD;  Location: [place] [facility];  Service: Cardiovascular;  Laterality: N/A;   ERCP N/A 11/16/2014   Procedure: ENDOSCOPIC RETROGRADE CHOLANGIOPANCREATOGRAPHY (ERCP);  Surgeon: [name], MD;  Location: [place];  Service: Endoscopy;  Laterality: N/A;   ESOPHAGOGASTRODUODENOSCOPY (EGD) WITH PROPOFOL N/A 12/21/2017   Procedure: ESOPHAGOGASTRODUODENOSCOPY (EGD) WITH PROPOFOL;  Surgeon: [name], MD;  [facility];  Service: Endoscopy;  Laterality: N/A;   EYE SURGERY     FOOT SURGERY Left 2005   "fixed bone that stuck out in my ankle area"   HEMORRHOID BANDING     IMPLANTABLE CARDIOVERTER DEFIBRILLATOR IMPLANT  09/06/09   BSX dual chamber ICD implanted in [place] for cardiac arrest 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   [place] Right ~ [place] N/A 03/19/2022   Procedure: LEFT HEART CATH AND CORONARY ANGIOGRAPHY;  Surgeon: Martinique, Peter M, MD;  Location: [place] [facility];  Service: Cardiovascular;  Laterality: N/A;   LEFT HEART CATHETERIZATION WITH CORONARY ANGIOGRAM N/A 11/25/2012   demonstrated EF 30%, inferior akinesis with mild hypokinesis of all walls, patent LAD and RCA stents; ostial PDA with 80-90% obstruction with medical therapy recommended   MOHS SURGERY  2008   nose, skin graft   POLYPECTOMY  12/21/2017    Procedure: POLYPECTOMY;  Surgeon: [name], MD;  [facility];  Service: Endoscopy;;   RETINAL DETACHMENT SURGERY Right 2013   TENOLYSIS Right 12/21/2013   Procedure: TENOLYSIS FLEXOR CARPI RADIALIS ,DEBRIDEMENT RIGHT JOINT WRIST,DEBRIDEMENT SCAPHOTRAPEZIAL TRAPEZOID, REPAIR OF EXTENSOR HOOD;  Surgeon: Daryll Brod, MD;  Location: [place];  Service: Orthopedics;  Laterality: Right;   TOE SURGERY Right 09/2019   3rd toe/hammer toe   V-TACH ABLATION  12/21/2014   VIDEO BRONCHOSCOPY Bilateral 01/09/2016   Procedure: VIDEO BRONCHOSCOPY WITHOUT FLUORO;  Surgeon: Juanito Doom, MD;  [facility];  Service: Cardiopulmonary;  Laterality: Bilateral;   Social History:  reports that he has quit smoking. His smoking use included cigarettes. He has a 55.00 pack-year smoking history. He has never used smokeless tobacco. He reports current alcohol use. He reports that he does not use drugs.  Allergies  Allergen Reactions   Sulfa Antibiotics Hives    Family  History  Problem Relation Age of Onset   Heart attack Brother    CAD Father    Hypertension Father    CAD Mother    Hypertension Mother    Hypertension Brother    Stroke Neg Hx     Prior to Admission medications   Medication Sig Start Date End Date Taking? Authorizing Provider  albuterol (PROVENTIL) (2.5 MG/3ML) 0.083% nebulizer solution USE 1 VIAL VIA NEBULIZER EVERY 6 HOURS AS NEEDED FOR WHEEZING OR SHORTNESS OF BREATH Patient taking differently: Take 2.5 mg by nebulization See admin instructions. USE 1 VIAL VIA NEBULIZER EVERY 6 HOURS AS NEEDED FOR WHEEZING OR SHORTNESS OF BREATH 07/18/19  Yes Olalere, Adewale A, MD  amiodarone (PACERONE) 200 MG tablet TAKE ONE TABLET BY MOUTH DAILY 04/08/22  Yes Croitoru, Mihai, MD  apixaban (ELIQUIS) 5 MG TABS tablet Take 1 tablet (5 mg total) by mouth 2 (two) times daily. 10/10/21  Yes Belva Crome, MD  Artificial Tear Solution (SOOTHE XP OP) Place 2 drops into  both eyes daily as needed (dry eyes).   Yes [provider]  ascorbic acid (VITAMIN C) 500 MG tablet Take 1 tablet by mouth daily. 02/10/22  Yes [provider]  aspirin EC 81 MG tablet Take 1 tablet (81 mg total) by mouth daily. Swallow whole. 03/21/22  Yes Almyra Deforest, PA  bisacodyl (DULCOLAX) 5 MG EC tablet Take 5 mg by mouth. 03/30/22 04/29/22 Yes [provider]  busPIRone (BUSPAR) 15 MG tablet Take 15 mg by mouth 2 (two) times daily. Patient taking 10 mg by mouth twice daily   Yes [provider]  calcium citrate (CALCITRATE - DOSED IN MG ELEMENTAL CALCIUM) 950 (200 Ca) MG tablet Take 200 mg of elemental calcium by mouth daily. 02/10/22  Yes [provider]  carvedilol (COREG) 3.125 MG tablet Take 1 tablet (3.125 mg total) by mouth 2 (two) times daily. 03/11/22  Yes Belva Crome, MD  cetirizine (ZYRTEC) 10 MG tablet Take 1 tablet by mouth daily. 12/16/17  Yes [provider]  Cyanocobalamin (VITAMIN B-12) 5000 MCG SUBL Place under the tongue daily.   Yes [provider]  empagliflozin (JARDIANCE) 10 MG TABS tablet Take 1 tablet (10 mg total) by mouth daily. 03/21/22  Yes Almyra Deforest, PA  escitalopram (LEXAPRO) 20 MG tablet Take 20 mg by mouth daily. Take 1/2 tablet (10 mg total ) by mouth daily   Yes [provider]  Fenofibric Acid 35 MG TABS Take 35 mg by mouth daily.   Yes [provider]  fluticasone (FLONASE) 50 MCG/ACT nasal spray Place 2 sprays into both nostrils daily. 02/13/21  Yes Fargo, Amy E, NP  furosemide (LASIX) 20 MG tablet Take 1 tablet (20 mg total) by mouth daily as needed for fluid or edema. 03/20/22  Yes Almyra Deforest, PA  gabapentin (NEURONTIN) 300 MG capsule Take 1 capsule (300 mg total) by mouth 2 (two) times daily. 03/18/22  Yes Fargo, Amy E, NP  guaiFENesin (MUCINEX) 600 MG 12 hr tablet Take 1 tablet by mouth 2 (two) times daily.   Yes [provider]  HYDROcodone-acetaminophen  (NORCO/VICODIN) 5-325 MG tablet Take 1 tablet by mouth 2 (two) times daily as needed for up to 5 days for moderate pain. 04/06/22 04/11/22 Yes Fargo, Amy E, NP  Ipratropium-Albuterol (COMBIVENT RESPIMAT) 20-100 MCG/ACT AERS respimat Inhale 1 puff into the lungs every 6 (six) hours. Shortness of breath or wheezing 09/18/21  Yes Fargo, Amy E, NP  IRON PO  Take 1 tablet by mouth daily.   Yes [provider]  MAGNESIUM-OXIDE 400 (241.3 Mg) MG tablet TAKE 1 TABSULE BY MOUTH DAILY Patient taking differently: Take 400 mg by mouth daily. 11/24/18  Yes Hoyt Koch, MD  mexiletine (MEXITIL) 200 MG capsule Take 1 capsule (200 mg total) by mouth 2 (two) times daily. 10/10/21  Yes Belva Crome, MD  Multiple Vitamins-Minerals (CENTRUM ADULTS PO) Take by mouth daily.   Yes [provider]  nitroGLYCERIN (NITROSTAT) 0.4 MG SL tablet Place 1 tablet (0.4 mg total) under the tongue every 5 (five) minutes as needed for chest pain. DISSOLVE 1 TABLET UNDER THE TONGUE EVERY 5 MINUTES FOR 3 DOSES 05/08/21  Yes Belva Crome, MD  omeprazole (PRILOSEC) 20 MG capsule Take 20 mg by mouth daily.    Yes [provider]  polyethylene glycol powder (GLYCOLAX/MIRALAX) 17 GM/SCOOP powder Take 1 capful in 8 ounces of fluid each morning 03/08/22  Yes Luster Landsberg, MD  rosuvastatin (CRESTOR) 40 MG tablet Take 1 tablet (40 mg total) by mouth daily. Patient taking differently: Take 40 mg by mouth at bedtime. 06/05/21  Yes [name], MD  sacubitril-valsartan (ENTRESTO) 24-26 MG Take 1 tablet by mouth 2 (two) times daily. 03/20/22  Yes Meng, Isaac Laud, PA  STIOLTO RESPIMAT 2.5-2.5 MCG/ACT AERS Inhale 2 puffs into the lungs daily. 09/22/21  Yes [provider]  lidocaine (LIDODERM) 5 % Place 1 patch onto the skin daily. Remove & Discard patch within 12 hours or as directed by MD Patient not taking: Reported on 04/09/2022 04/06/22   Yvonna Alanis, NP  mupirocin ointment (BACTROBAN) 2 % Apply 1 application.  topically 2 (two) times daily. Apply to lower leg wounds daily prn Patient not taking: Reported on 04/09/2022 07/17/21   Yvonna Alanis, NP  Respiratory Therapy Supplies (FLUTTER) DEVI Use as directed Patient taking differently: 1 each by Other route See admin instructions. Use as directed 03/16/17   Juanito Doom, MD  Sennosides-Docusate Sodium (STOOL SOFTENER/LAXATIVE PO) Take 2 tablets by mouth daily.    [provider]  Spacer/Aero-Holding Chambers (OPTICHAMBER DIAMOND) [place] optichamber [facility] Patient taking differently: 1 each by Other route See admin instructions. optichamber [facility] 09/12/19   Olalere, Adewale A, MD  tamsulosin (FLOMAX) 0.4 MG CAPS capsule TAKE ONE CAPSULE BY MOUTH DAILY AFTER SUPPER Patient taking differently: Take 0.4 mg by mouth See admin instructions. TAKE ONE CAPSULE BY MOUTH DAILY AFTER SUPPER 06/30/21   Yvonna Alanis, NP    Physical Exam: Vitals:   04/09/22 1031 04/09/22 1300 04/09/22 1449 04/09/22 1528  BP:  132/76 (!) 152/85 133/89  Pulse:  67 82 72  Resp:  '20 18 18  '$ Temp: 97.9 F (36.6 C)  98.5 F (36.9 C) 98.2 F (36.8 C)  TempSrc: Oral  Oral Oral  SpO2:  96% 97% 98%  Weight:      Height:        Constitutional: Elderly male currently in no acute distress. Eyes: PERRL, lids and conjunctivae normal ENMT: Mucous membranes are moist.   Neck: normal, supple, no JVD. Respiratory: clear to auscultation bilaterally, no wheezing, no crackles. Normal respiratory effort. No accessory muscle use.  Cardiovascular: Regular rate and rhythm, no lower extremity edema. 2+ pedal pulses. Abdomen: Abdomen with visible inguinal hernia present Musculoskeletal: no clubbing / cyanosis. No joint deformity upper and lower extremities. Good ROM, no contractures. Normal muscle tone.  Skin: Venous stasis changes of the bilateral lower extremities. Neurologic: CN 2-12  grossly intact.  Strength 5/5 in all 4.  Psychiatric: Normal judgment and insight. Alert and oriented x 3.  Normal mood.   Data Reviewed:    Assessment and Plan:  Incarcerated left inguinal hernia Partial small bowel obstruction Patient presented with complaints of abdominal pain with radiation into his groin.  CT scan of the abdomen pelvis noted large inguinal hernia containing large intestine and now small bowel with concern for possibility of incarceration.  Patient had last taken Eliquis yesterday.  General surgery was consulted and consulted cardiology for cardiac clearance. -Admit to a telemetry bed -Heart healthy diet and n.p.o. after midnight -Morphine IV as needed for pain -Appreciate general surgery consultative services,  will follow-up any further recommendations.  Paroxysmal atrial fibrillation on chronic anticoagulation Patient appears to be in a sinus rhythm at this time. -Goal potassium at least 4 and magnesium at least 2 -Hold Eliquis -Heparin drip per pharmacy -Continue amiodarone  Heart failure with reduced EF Patient appears to be euvolemic at this time.  Last echocardiogram noted EF to be 25 to 30% with grade 1 diastolic dysfunction back in 02/2022. -Strict I&O's and daily weights -Fluid restriction of 1500 mL -Continue current cardiac meds  S/p AICD History of VT  CAD Patient's most recent cardiac catheter showed two-vessel obstructive CAD 100% occlusion of the distal OM3 and 80% stenosis of the RPDA.  Medical management has been amended. -Continue statin  Essential hypertension -Continue current home medication regimen  COPD, without acute exacerbation No significant wheezing appreciated on physical exam at this time.  O2 saturations currently maintained on room air. -Continue home inhalers and breathing treatments as needed  AAA Patient has a 4.5 cm thoracic aortic aneurysm and a 3.4 cm infrarenal abdominal aortic aneurysm -Continue outpatient surveillance and follow-up  DVT prophylaxis: Heparin Advance Care Planning:   Code Status: Full Code    Consults: General surgery, cardiology  Family Communication: Wife updated at bedside  Severity of Illness: The appropriate patient status for this patient is INPATIENT. Inpatient status is judged to be reasonable and necessary in order to provide the required intensity of service to ensure the patient's safety. The patient's presenting symptoms, physical exam findings, and initial radiographic and laboratory data in the context of their chronic comorbidities is felt to place them at high risk for further clinical deterioration. Furthermore, it is not anticipated that the patient will be medically stable for discharge from the hospital within 2 midnights of admission.   * I certify that at the point of admission it is my clinical judgment that the patient will require inpatient hospital care spanning beyond 2 midnights from the point of admission due to high intensity of service, high risk for further deterioration and high frequency of surveillance required.*  Author: Norval Morton, MD 04/09/2022 6:54 PM  For on call review [email or web address].

## 2022-04-09 NOTE — Progress Notes (Addendum)
ANTICOAGULATION CONSULT NOTE - Initial Consult  Pharmacy Consult for heparin Indication: atrial fibrillation  Allergies  Allergen Reactions   Sulfa Antibiotics Hives    Patient Measurements: Height: 6' (182.9 cm) Weight: 86.2 kg (190 lb) IBW/kg (Calculated) : 77.6 Heparin Dosing Weight: 86.2 kg  Vital Signs: Temp: 97.9 F (36.6 C) (01/11 1031) Temp Source: Oral (01/11 1031) BP: 141/85 (01/11 1030) Pulse Rate: 67 (01/11 1030)  Labs: Recent Labs    04/06/22 1608 04/08/22 1857  HGB 12.3* 12.9*  HCT 36.9* 39.9  PLT 143* 185  CREATININE 0.72* 0.75    Estimated Creatinine Clearance: 76.8 mL/min (by C-G formula based on SCr of 0.75 mg/dL).   Medical History: Past Medical History:  Diagnosis Date   AICD (automatic cardioverter/defibrillator) present    Aneurysm ([place])    a. Aneurysmal infrarenal aorta up to 33 mm on CT 10/2014, recommended f/u due 10/2017   Anginal pain ([place])    Anxiety    Basal cell carcinoma of nose    S/P MOHS   Biliary acute pancreatitis    CAD (coronary artery disease)    a. s/p MI in 1994 with PCI to LAD at that time b. cath 10/2012 demonstrated EF 30%, inferior akinesis with mild hypokinesis of all walls, patent LAD and RCA stents; ostial PDA with 80-90% obstruction with medical therapy recommended    Chronic systolic CHF (congestive heart failure) (HCC)    EF 30 to 35 % as of 09/2014.    CKD (chronic kidney disease), stage III ([place])    Complication of anesthesia 10/2014   "had to have defibrillator w/ERCP"   COPD (chronic obstructive pulmonary disease) ([place])    a. followed by pulmonary, COPD GOLD stage II   Depression    Diverticulosis of colon 07/2014   noted on CT   GERD (gastroesophageal reflux disease)    Hiatal hernia    Hyperglycemia 10/2012.   Hyperlipidemia    Hypertension    Myocardial infarction [facility]) 1994; 2011   Pneumonia 1946; 2015   Prostate enlargement 07/2014   observed on CT   Tobacco abuse    Ventricular tachycardia ([place])     a. 08/2009 s/p BSX E110 Teligen 100 AICD, ser#: [id number];  b. 08/2008 VT req ATP - detection reprogrammed from 160 to 150. c. EPS and VT ablation by Dr. Lovena Le 12/21/2014   Assessment: 82 year old man presenting with abdominal pain in left groin area- patient has known inguinal hernia. Has past medical history of atrial fibrillation, on apixaban PTA. Last dose taken on 04/08/22 at 13:00. Pharmacy has been consulted to start heparin infusion. Surgery planning for open [facility] repair on 04/10/22.  Given recent apixaban use, will start heparin infusion without a bolus and will monitor with heparin and aPTT levels until correlating, then switch to heparin levels only. CBC is WNL.  Goal of Therapy:  Heparin level 0.3-0.7 units/ml aPTT 66-102 seconds Monitor platelets by anticoagulation protocol: Yes   Plan:  Start heparin infusion at 1250 units/hour Check 8-hour heparin and aPTT levels Monitor daily heparin/aPTT levels, CBC, and signs/symptoms of bleeding  Louanne Belton, PharmD, [facility] PGY1 Pharmacy Resident 04/09/2022 12:13 PM

## 2022-04-09 NOTE — Consult Note (Signed)
Cardiology Consultation   Patient ID: JAQWAN WIEBER MRN: [id number]; DOB: 10-27-1940  Admit date: 04/08/2022 Date of Consult: 04/09/2022  PCP:  Yvonna Alanis, NP   [place] Prov[facility] Cardiologist:  Sanda Klein, MD  Electrophysiologist:  Cristopher Peru, MD    Patient Profile:   Steven Guzman is a 82 y.o. male with a hx of CAD s/p inferior MI/cardiac arrest '95, MI treated with PCI of LAD/RCA '11, COPD, paroxysmal atrial fibrillation on eliquis, s/p AICD implant 2010, VT s/p ablation in 2016, recent fall in 01/2022 that resulted in femoral neck fracture s/p hemiarthroplasty, hiatal hernia, AAA, thoracic aortic aneurysm who is being seen 04/09/2022 for the evaluation of preoperative evaluation at the request of Dr. Tamala Julian.  History of Present Illness:   Mr. Depaul is an 82 year old male with above medical history who is followed by Dr. Sallyanne Kuster, Dr. Lovena Le. Patient has a long history of CAD, with history of inferior MI and cardiac arrest in 1995, [place] in 2011 that was treated with PCI of the LAD/RCA. Reportedly, patient had V-tach at the time of presentation with his MI. After PCI, patient had an EP study and had an ICD implanted for treatment of v-tach. More recently, cardiac catheterization in 10/2012 showed patent LAD and RCA stents with 80-90% stenosis in the PDA, 50% stenosis in the proximal circumflex after first OM. Patient was managed medically. Had a V-tach ablation in 11/2014 with Dr. Lovena Le. Echocardiogram in 03/2021 showed EF 40-45% with regional wall motion abnormalities (inferior/lateral akinesis, anterior lateral hypokinesis), grade I diastolic dysfunction, normal RV systolic function, moderately dilated LA, mildly dilated RA.  Patient had a fall resulting in femoral neck fracture and underwent hemiarthroplasty on 02/07/22. Underwent rehab at SNF. Seen in the ED on 02/22/22 for dyspnea and was diagnosed with COVID-19. Again seen in the ED on 02/25/22 with complaints of  epigastric pain. CT imaging on 11/29 showed a thoracic aortic aneurysm measuring 4.5 cm within the ascending aorta and 4.5 at the distal aortic arch. Recommended semi-annual imaging. CT also showed a 3.3 cm infrarenal abdominal aortic aneurysm. Again seen in the ED on 03/08/22 for treatment of constipation. CT abdomen pelvis showed chronic dissection of the infrarenal abdominal aorta measuring 3.2 x 3.2 cm.   Note-- ICD device check on 03/13/22 showed that device had reached ERI. Scheduled for gen change on 04/20/22.   Patient was admitted from 03/18/22- 03/20/22 for NSTEMI. Left heart cath on 03/19/22 showed 2 vessel obstructive CAD with 100% occlusion of the distal OM3 and 80% stenosis in the RPDA. Stents in the LAD and RCA were patent. Patient was managed medically. Echocardiogram on 03/19/22 showed EF 25-30% (down from 40-45% prior) with regional wall motion abnormalities, mild LVH, grade I diastolic dysfunction, normal RV systolic function, mild-moderate mitral valve regurgitation, mild dilation of the aortic root measuring 39 mm. Patient was discharged on ASA, eliquis, entreso, jardiance, amiodarone, carvedilol  Patient presented to the ED on 1/10 complaining of abdominal pain in the left groin area. He had a known inguinal hernia. CT abdomen pelvis on 1/10 showed a large left inguinal hernia containing sigmoid colon and a loop of small bowel. The herniated loop of small bowel was mildly distended and contained feculent material compatible with partial obstruction. Patient was seen by general surgery, who recommended open [facility] repair. They requested cardiology evaluate for perioperative risk stratification.    Past Medical History:  Diagnosis Date   AICD (automatic cardioverter/defibrillator) present    Aneurysm (  [place])    a. Aneurysmal infrarenal aorta up to 33 mm on CT 10/2014, recommended f/u due 10/2017   Anginal pain ([place])    Anxiety    Basal cell carcinoma of nose    S/P MOHS   Biliary acute  pancreatitis    CAD (coronary artery disease)    a. s/p MI in 1994 with PCI to LAD at that time b. cath 10/2012 demonstrated EF 30%, inferior akinesis with mild hypokinesis of all walls, patent LAD and RCA stents; ostial PDA with 80-90% obstruction with medical therapy recommended    Chronic systolic CHF (congestive heart failure) (HCC)    EF 30 to 35 % as of 09/2014.    CKD (chronic kidney disease), stage III ([place])    Complication of anesthesia 10/2014   "had to have defibrillator w/ERCP"   COPD (chronic obstructive pulmonary disease) ([place])    a. followed by pulmonary, COPD GOLD stage II   Depression    Diverticulosis of colon 07/2014   noted on CT   GERD (gastroesophageal reflux disease)    Hiatal hernia    Hyperglycemia 10/2012.   Hyperlipidemia    Hypertension    Myocardial infarction [facility]) 1994; 2011   Pneumonia 1946; 2015   Prostate enlargement 07/2014   observed on CT   Tobacco abuse    Ventricular tachycardia ([place])    a. 08/2009 s/p BSX E110 Teligen 100 AICD, ser#: [id number];  b. 08/2008 VT req ATP - detection reprogrammed from 160 to 150. c. EPS and VT ablation by Dr. Lovena Le 12/21/2014    Past Surgical History:  Procedure Laterality Date   BIOPSY  12/21/2017   Procedure: BIOPSY;  Surgeon: [name], MD;  Location: [facility];  Service: Endoscopy;;   CATARACT EXTRACTION W/ INTRAOCULAR LENS  IMPLANT, BILATERAL Bilateral ~ 2011   COLONOSCOPY     COLONOSCOPY WITH PROPOFOL N/A 12/21/2017   Procedure: COLONOSCOPY WITH PROPOFOL;  Surgeon: [name], MD;  [facility]: [facility];  Service: Endoscopy;  Laterality: N/A;   ELECTROPHYSIOLOGIC STUDY N/A 12/21/2014   Procedure: V Tach Ablation;  Surgeon: [name], MD;  Location: [place] [facility];  Service: Cardiovascular;  Laterality: N/A;   ERCP N/A 11/16/2014   Procedure: ENDOSCOPIC RETROGRADE CHOLANGIOPANCREATOGRAPHY (ERCP);  Surgeon: [name], MD;  Location: [place];  Service: Endoscopy;  Laterality: N/A;    ESOPHAGOGASTRODUODENOSCOPY (EGD) WITH PROPOFOL N/A 12/21/2017   Procedure: ESOPHAGOGASTRODUODENOSCOPY (EGD) WITH PROPOFOL;  Surgeon: [name], MD;  [facility];  Service: Endoscopy;  Laterality: N/A;   EYE SURGERY     FOOT SURGERY Left 2005   "fixed bone that stuck out in my ankle area"   HEMORRHOID BANDING     IMPLANTABLE CARDIOVERTER DEFIBRILLATOR IMPLANT  09/06/09   BSX dual chamber ICD implanted in [place] for cardiac arrest 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   [place] Right ~ [place] N/A 03/19/2022   Procedure: LEFT HEART CATH AND CORONARY ANGIOGRAPHY;  Surgeon: [place], Keniya Schlotterbeck M, MD;  Location: [place] [facility];  Service: Cardiovascular;  Laterality: N/A;   LEFT HEART CATHETERIZATION WITH CORONARY ANGIOGRAM N/A 11/25/2012   demonstrated EF 30%, inferior akinesis with mild hypokinesis of all walls, patent LAD and RCA stents; ostial PDA with 80-90% obstruction with medical therapy recommended   MOHS SURGERY  2008   nose, skin graft   POLYPECTOMY  12/21/2017   Procedure: POLYPECTOMY;  Surgeon: [name], MD;  Location:  [facility];  Service: Endoscopy;;   RETINAL DETACHMENT SURGERY Right 2013   TENOLYSIS Right 12/21/2013   Procedure: TENOLYSIS FLEXOR CARPI RADIALIS ,DEBRIDEMENT RIGHT JOINT WRIST,DEBRIDEMENT SCAPHOTRAPEZIAL TRAPEZOID, REPAIR OF EXTENSOR HOOD;  Surgeon: Daryll Brod, MD;  Location: [place];  Service: Orthopedics;  Laterality: Right;   TOE SURGERY Right 09/2019   3rd toe/hammer toe   V-TACH ABLATION  12/21/2014   VIDEO BRONCHOSCOPY Bilateral 01/09/2016   Procedure: VIDEO BRONCHOSCOPY WITHOUT FLUORO;  Surgeon: Juanito Doom, MD;  [facility];  Service: Cardiopulmonary;  Laterality: Bilateral;     Inpatient Medications: Scheduled Meds:  Continuous Infusions:  PRN Meds: acetaminophen **OR** acetaminophen, albuterol  Allergies:    Allergies  Allergen Reactions   Sulfa  Antibiotics Hives    Social History:   Social History   Socioeconomic History   Marital status: Married    Spouse name: Not on file   Number of children: Not on file   Years of education: Not on file   Highest education level: Not on file  Occupational History   Occupation: Retired  Tobacco Use   Smoking status: Former    Packs/day: 1.00    Years: 55.00    Total pack years: 55.00    Types: Cigarettes   Smokeless tobacco: Never   Tobacco comments:    off/on, always ready to quit but does not work out  Scientific laboratory technician Use: Never used  Substance and Sexual Activity   Alcohol use: Yes    Alcohol/week: 0.0 standard drinks of alcohol    Comment: occ   Drug use: No   Sexual activity: Not Currently  Other Topics Concern   Not on file  Social History Narrative   Not on file   Social Determinants of Health   Financial Resource Strain: Medium Risk (03/06/2021)   Overall Financial Resource Strain (CARDIA)    Difficulty of Paying Living Expenses: Somewhat hard  Food Insecurity: No Food Insecurity (03/24/2022)   Hunger Vital Sign    Worried About Running Out of Food in the Last Year: Never true    Ran Out of Food in the Last Year: Never true  Transportation Needs: No Transportation Needs (03/24/2022)   PRAPARE - Hydrologist (Medical): No    Lack of Transportation (Non-Medical): No  Physical Activity: Insufficiently Active (03/06/2021)   Exercise Vital Sign    Days of Exercise per Week: 4 days    Minutes of Exercise per Session: 20 min  Stress: Stress Concern Present (03/06/2021)   [place]    Feeling of Stress : Rather much  Social Connections: Moderately Isolated (03/06/2021)   Social Connection and Isolation Panel [NHANES]    Frequency of Communication with Friends and Family: More than three times a week    Frequency of Social Gatherings with Friends and Family: Once a week     Attends Religious Services: Never    Marine scientist or Organizations: No    Attends Archivist Meetings: Never    Marital Status: Married  Human resources officer Violence: Not At Risk (03/19/2022)   Humiliation, Afraid, Rape, and Kick questionnaire    Fear of Current or Ex-Partner: No    Emotionally Abused: No    Physically Abused: No    Sexually Abused: No    Family History:    Family History  Problem Relation Age of Onset   Heart attack Brother  CAD Father    Hypertension Father    CAD Mother    Hypertension Mother    Hypertension Brother    Stroke Neg Hx      ROS:  Please see the history of present illness.   All other ROS reviewed and negative.     Physical Exam/Data:   Vitals:   04/09/22 0804 04/09/22 0930 04/09/22 1030 04/09/22 1031  BP: (!) 143/89 (!) 142/83 (!) 141/85   Pulse: 74 70 67   Resp: '18 18 16   '$ Temp:    97.9 F (36.6 C)  TempSrc:    Oral  SpO2: 90% 98% 99%   Weight:      Height:       No intake or output data in the 24 hours ending 04/09/22 1156    04/08/2022    8:29 PM 04/03/2022    1:28 PM 03/19/2022    2:13 PM  Last 3 Weights  Weight (lbs) 190 lb 190 lb 2 oz 192 lb 3.9 oz  Weight (kg) 86.183 kg 86.24 kg 87.2 kg     Body mass index is 25.77 kg/m.   Talkative elderly male AICD under left clavicle PMI increased JVP normal  Abdomen with large left inguinal hernia not easily reducible With mild pain No edema Palpable pedal pulses   EKG:   Sinus RBBB nonspecific ST changes  Telemetry:  Telemetry was personally reviewed and demonstrates:  NSR  Relevant CV Studies:  Echo:  03/19/22  IMPRESSIONS     1. Left ventricular ejection fraction, by estimation, is 25 to 30%. The  left ventricle has severely decreased function. The left ventricle  demonstrates regional wall motion abnormalities with inferior akinesis,  basal to mid inferolateral akinesis,  anterolateral severe hypokinesis. The left ventricular internal  cavity  size was mildly dilated. There is mild concentric left ventricular  hypertrophy. Left ventricular diastolic parameters are consistent with  Grade I diastolic dysfunction (impaired  relaxation).   2. Right ventricular systolic function is normal. The right ventricular  size is normal. There is normal pulmonary artery systolic pressure. The  estimated right ventricular systolic pressure is 37.6 mmHg.   3. Left atrial size was mildly dilated.   4. Right atrial size was mildly dilated.   5. The mitral valve is abnormal. Mild to moderate mitral valve  regurgitation. No evidence of mitral stenosis.   6. The aortic valve is tricuspid. There is mild calcification of the  aortic valve. Aortic valve regurgitation is mild. No aortic stenosis is  present.   7. Aortic dilatation noted. There is mild dilatation of the aortic root,  measuring 39 mm.   8. The inferior vena cava is dilated in size with >50% respiratory  variability, suggesting right atrial pressure of 8 mmHg.   Left Heart Cath 03/19/22 :  Conclusion      Mid LM to Dist LM lesion is 20% stenosed.   Ost LAD to Prox LAD lesion is 25% stenosed.   Prox LAD to Mid LAD lesion is 35% stenosed.   3rd Mrg lesion is 100% stenosed.   Prox RCA to Mid RCA lesion is 40% stenosed.   RPDA lesion is 80% stenosed.   There is mild left ventricular systolic dysfunction.   LV end diastolic pressure is mildly elevated.   The left ventricular ejection fraction is 45-50% by visual estimate.   2 vessel obstructive CAD - 100% occlusion of distal OM3 appears to be the culprit. The ostial PDA lesion is unchanged from  2014 Patent stents in the LAD and RCA Mild LV dysfunction with inferior wall motion abnormality Mildly elevated LVEDP 16 mm Hg   Plan: recommend medical therapy   Laboratory Data:  High Sensitivity Troponin:   Recent Labs  Lab 03/18/22 0438 03/18/22 0708  TROPONINIHS 69* 497*     Chemistry Recent Labs  Lab 04/06/22 1608  04/08/22 1857  NA 139 136  K 4.2 3.9  CL 103 99  CO2 23 27  GLUCOSE 129* 92  BUN 6* 9  CREATININE 0.72* 0.75  CALCIUM 9.1 9.4  GFRNONAA  --  >60  ANIONGAP  --  10    Recent Labs  Lab 04/08/22 1857  PROT 7.0  ALBUMIN 3.6  AST 25  ALT 23  ALKPHOS 63  BILITOT 0.8   Lipids No results for input(s): "CHOL", "TRIG", "HDL", "LABVLDL", "LDLCALC", "CHOLHDL" in the last 168 hours.  Hematology Recent Labs  Lab 04/06/22 1608 04/08/22 1857  WBC 4.0 5.0  RBC 3.65* 3.80*  HGB 12.3* 12.9*  HCT 36.9* 39.9  MCV 101* 105.0*  MCH 33.7* 33.9  MCHC 33.3 32.3  RDW 15.7* 17.2*  PLT 143* 185   Thyroid No results for input(s): "TSH", "FREET4" in the last 168 hours.  BNPNo results for input(s): "BNP", "PROBNP" in the last 168 hours.  DDimer No results for input(s): "DDIMER" in the last 168 hours.   Radiology/Studies:  CT ABDOMEN PELVIS W CONTRAST  Result Date: 04/08/2022 CLINICAL DATA:  Right lower quadrant abdominal pain radiating to the right groin. History of inguinal hernia. EXAM: CT ABDOMEN AND PELVIS WITH CONTRAST TECHNIQUE: Multidetector CT imaging of the abdomen and pelvis was performed using the standard protocol following bolus administration of intravenous contrast. RADIATION DOSE REDUCTION: This exam was performed according to the departmental dose-optimization program which includes automated exposure control, adjustment of the mA and/or kV according to patient size and/or use of iterative reconstruction technique. CONTRAST:  14m OMNIPAQUE IOHEXOL 350 MG/ML SOLN COMPARISON:  03/08/2022 FINDINGS: Lower chest: Small hiatal hernia.  No acute abnormality Hepatobiliary: Pneumobilia. Unremarkable gallbladder. No suspicious focal hepatic lesion. Pancreas: Unremarkable. Spleen: Unremarkable. Adrenals/Urinary Tract: Unchanged adrenal glands. No obstructing urinary calculi or hydronephrosis. The bladder is not well distended. Stomach/Bowel: Herniation of the sigmoid and distal descending colon  into the large left inguinal hernia. There is sigmoid diverticula with wall thickening of the sigmoid colon within the hernia sac. Additional herniation of a loop of small bowel into the left inguinal hernia sac. There is fecalization of small bowel contents within the hernia sac and borderline dilation of the herniated loop of small bowel. Normal appendix. Redemonstrated rectal wall thickening though this is decreased from prior. Vascular/Lymphatic: Aortic atherosclerotic calcification. Focal dilation of the infrarenal abdominal aorta measuring 34 mm in diameter. No suspicious lymphadenopathy. Reproductive: Unremarkable. Other: Large left and small right inguinal hernias. No free intraperitoneal air. Musculoskeletal: No acute fracture. Right THA. Which versus preference IMPRESSION: 1. Large left inguinal hernia containing sigmoid colon and a loop of small bowel 2. The herniated loop of small bowel is mildly distended and contains feculent material compatible with partial obstruction. 3. The herniated loop of sigmoid colon contains multiple diverticula with mild wall thickening suggestive of diverticulitis/colitis. 4. Redemonstrated proctitis though this is improved from 03/08/2022. 5. Small hiatal hernia. 6. Infrarenal abdominal aorta measuring 34 mm in maximum diameter. Recommend follow-up ultrasound every 3 years. This recommendation follows ACR consensus guidelines: White Paper of the ACR Incidental Findings Committee II on Vascular Findings. J Am Coll  Radiol 2013; 86:754-492. 7. Aortic Atherosclerosis (ICD10-I70.0). Electronically Signed   By: Placido Sou M.D.   On: 04/08/2022 22:37     Assessment and Plan:   Preoperative Risk Evaluation  - Patient has an extensive cardiac history, including CAD, ischemic cardiomyopathy, V-tach s/p ablation and ICD implant, paroxysmal atrial fibrillation. Also has a 4.5 cm thoracic aortic aneurysm and a 3.4 cm infrarenal abdominal aortic aneurysm.  - Most recent cath  on 03/19/22 showed 2 vessel obstructive CAD with 100% occlusion of the distal OM3, 80% stenosis of the RPDA. Also showed 20% stenosis in the mid-distal LM, 25% stenosis in the ostial-proximal LAD, 35% stenosis in the prox-mid LAD, 40% stenosis in the prox-mid RCA. Patent stents in the LAD and RCA. Plan was for medical therapy - Echo from 03/19/22 showed EF 25-30% with regional wall motion abnormalities, grade I diastolic dysfunction, normal RV systolic function  He has good functional status walking with cane post hip fracture No CHF despite low EF and better optimized GDMT after cath. Recent cath with no vessels needing intervention. Has been to ER 3 times for incarcerated hernia with pain High risk of recurrence and septicemia. His AICD is scheduled for replacement 04/20/21 Best to take care of this now for symptomatic reasons and to decrease risk of device infection on re implant. Spoke with EP and ERI on device started 03/04/22 so he has full device function 3 months from that date. Most recent PACEART with 3 episodes of ATP but no shocks.   - Hold eliquis resume heparin post op when ok with surgery and d/c with eliquis - Keep I/O' equal with K > 4 Mg > 2 - continue pre admission CHF meds  - No need to turn AICD off or magnet with inguinal surgery  -  Cardiology will follow in post op period   Jenkins Rouge MD [facility]

## 2022-04-09 NOTE — Progress Notes (Signed)
ANTICOAGULATION CONSULT NOTE  Pharmacy Consult for heparin Indication: Steven Guzman  Allergies  Allergen Reactions   Sulfa Antibiotics Hives    Patient Measurements: Height: 6' (182.9 cm) Weight: 86.2 kg (190 lb) IBW/kg (Calculated) : 77.6 Heparin Dosing Weight: 86.2 kg  Vital Signs: Temp: 98 F (36.7 C) (01/11 2004) Temp Source: Oral (01/11 2004) BP: 138/85 (01/11 2004) Pulse Rate: 71 (01/11 2004)  Labs: Recent Labs    04/08/22 1857 04/09/22 2137  HGB 12.9*  --   HCT 39.9  --   PLT 185  --   APTT  --  44*  HEPARINUNFRC  --  0.72*  CREATININE 0.75  --      Estimated Creatinine Clearance: 76.8 mL/min (by C-G formula based on SCr of 0.75 mg/dL).   Medical History: Past Medical History:  Diagnosis Date   AICD (automatic cardioverter/defibrillator) present    Aneurysm ([place])    a. Aneurysmal infrarenal aorta up to 33 mm on CT 10/2014, recommended f/u due 10/2017   Anginal pain ([place])    Anxiety    Basal cell carcinoma of nose    S/P MOHS   Biliary acute pancreatitis    CAD (coronary artery disease)    a. s/p MI in 1994 with PCI to LAD at that time b. cath 10/2012 demonstrated EF 30%, inferior akinesis with mild hypokinesis of all walls, patent LAD and RCA stents; ostial PDA with 80-90% obstruction with medical therapy recommended    Chronic systolic CHF (congestive heart failure) (HCC)    EF 30 to 35 % as of 09/2014.    CKD (chronic kidney disease), stage III ([place])    Complication of anesthesia 10/2014   "had to have defibrillator w/ERCP"   COPD (chronic obstructive pulmonary disease) ([place])    a. followed by pulmonary, COPD GOLD stage II   Depression    Diverticulosis of colon 07/2014   noted on CT   GERD (gastroesophageal reflux disease)    Hiatal hernia    Hyperglycemia 10/2012.   Hyperlipidemia    Hypertension    Myocardial infarction [facility]) 1994; 2011   Pneumonia 1946; 2015   Prostate enlargement 07/2014   observed on CT   Tobacco abuse     Ventricular tachycardia ([place])    a. 08/2009 s/p BSX E110 Teligen 100 AICD, ser#: [id number];  b. 08/2008 VT req ATP - detection reprogrammed from 160 to 150. c. EPS and VT ablation by Dr. Lovena Le 12/21/2014   Assessment: 82 year old man presenting with abdominal pain in left groin area- patient has known inguinal hernia. Has past medical history of Steven Guzman, on apixaban PTA. Last dose taken on 04/08/22 at 13:00. Pharmacy has been consulted to start heparin infusion. Surgery planning for open [facility] repair on 04/10/22.  Given recent apixaban use, will start heparin infusion without a bolus and will monitor with heparin and aPTT levels until correlating, then switch to heparin levels only. CBC is WNL.  Initial heparin level falsely elevated by DOAC use as expected, aPTT is subtherapeutic at 44 seconds.  Goal of Therapy:  Heparin level 0.3-0.7 units/ml aPTT 66-102 seconds Monitor platelets by anticoagulation protocol: Yes   Plan:  Increase heparin to 1450 units/h Recheck heparin level and aPTT in 8h  Arrie Senate, PharmD, [place], [facility] Clinical Pharmacist [phone] Please check AMION for all [facility] Pharmacy numbers 04/09/2022

## 2022-04-09 NOTE — Progress Notes (Addendum)
Steven Guzman 06-13-1940  [id number].    Requesting MD: Regenia Skeeter, MD Chief Complaint/Reason for Consult: left inguinal hernia, abdominal pain  HPI:  Viren Lebeau is a 82 year old male with a past medical history of COPD, CAD s/p MI in 1995/2011 and subsequent stent of LAD/RCA, AICD placed in 2010, afib on eliquis, recent fall 02/06/2022 resulting in femoral neck fracture and hemiarthroplasty at [facility], and hiatal hernia who presents with abdominal pain and enlarging left inguinal hernia. He tells me he has had a left inguinal hernia for 2-3 years. He first noticed the hernia in his scrotum and it was reducible for a while. He used to wear a truss. He tells me that the hernia has been getting larger and has been irreducible for over a year. He reports some abdominal pain over the last couple of weeks that became acutely worse yesterday and was associated with a bulge in his left groin. He denies associated nausea or vomiting. He reports having flatus. States his last BM was yesterday and was large volume, non-bloody. He reports decreased frequency of BMs over the last week. He also adds that he has lost 20 lbs over the last 2 months and he is unsure why. States he last colonoscopy was 2 years ago and he had some polyps removed that were benign.   Last reported dose of eliquis was yesterday morning 1/10.  Lives at home with his wife   Surgical history: RIH repair in the 1990's  ERCP in 2016 for gallstone panc/choledocho but post-procedure had ventricular tachycardia requiring ICD shocks so cholecystectomy was deferred and then did not happen.   Colonoscopy 12/21/2017 Dr. Henrene Pastor  Diagnosis 1. Colon, polyp(s), ascending, transverse - TUBULAR ADENOMA(S) WITHOUT HIGH GRADE DYSPLASIA OR MALIGNANCY. 2. Colon, polyp(s), descending, rectal - TUBULAR ADENOMA(S) WITHOUT HIGH GRADE DYSPLASIA OR MALIGNANCY. - HYPERPLASTIC POLYP.  ROS:As above Review of Systems  All other systems reviewed and are  negative.   Family History  Problem Relation Age of Onset   Heart attack Brother    CAD Father    Hypertension Father    CAD Mother    Hypertension Mother    Hypertension Brother    Stroke Neg Hx     Past Medical History:  Diagnosis Date   AICD (automatic cardioverter/defibrillator) present    Aneurysm ([place])    a. Aneurysmal infrarenal aorta up to 33 mm on CT 10/2014, recommended f/u due 10/2017   Anginal pain ([place])    Anxiety    Basal cell carcinoma of nose    S/P MOHS   Biliary acute pancreatitis    CAD (coronary artery disease)    a. s/p MI in 1994 with PCI to LAD at that time b. cath 10/2012 demonstrated EF 30%, inferior akinesis with mild hypokinesis of all walls, patent LAD and RCA stents; ostial PDA with 80-90% obstruction with medical therapy recommended    Chronic systolic CHF (congestive heart failure) (HCC)    EF 30 to 35 % as of 09/2014.    CKD (chronic kidney disease), stage III ([place])    Complication of anesthesia 10/2014   "had to have defibrillator w/ERCP"   COPD (chronic obstructive pulmonary disease) ([place])    a. followed by pulmonary, COPD GOLD stage II   Depression    Diverticulosis of colon 07/2014   noted on CT   GERD (gastroesophageal reflux disease)    Hiatal hernia    Hyperglycemia 10/2012.   Hyperlipidemia    Hypertension  Myocardial infarction [facility]) 1994; 2011   Pneumonia 1946; 2015   Prostate enlargement 07/2014   observed on CT   Tobacco abuse    Ventricular tachycardia ([place])    a. 08/2009 s/p BSX E110 Teligen 100 AICD, ser#: [id number];  b. 08/2008 VT req ATP - detection reprogrammed from 160 to 150. c. EPS and VT ablation by Dr. Lovena Le 12/21/2014    Past Surgical History:  Procedure Laterality Date   BIOPSY  12/21/2017   Procedure: BIOPSY;  Surgeon: [name], MD;  Location: [facility];  Service: Endoscopy;;   CATARACT EXTRACTION W/ INTRAOCULAR LENS  IMPLANT, BILATERAL Bilateral ~ 2011   COLONOSCOPY     COLONOSCOPY WITH PROPOFOL N/A  12/21/2017   Procedure: COLONOSCOPY WITH PROPOFOL;  Surgeon: [name], MD;  Location: [facility];  Service: Endoscopy;  Laterality: N/A;   ELECTROPHYSIOLOGIC STUDY N/A 12/21/2014   Procedure: V Tach Ablation;  Surgeon: [name], MD;  Location: [place] [facility];  Service: Cardiovascular;  Laterality: N/A;   ERCP N/A 11/16/2014   Procedure: ENDOSCOPIC RETROGRADE CHOLANGIOPANCREATOGRAPHY (ERCP);  Surgeon: [name], MD;  Location: [place];  Service: Endoscopy;  Laterality: N/A;   ESOPHAGOGASTRODUODENOSCOPY (EGD) WITH PROPOFOL N/A 12/21/2017   Procedure: ESOPHAGOGASTRODUODENOSCOPY (EGD) WITH PROPOFOL;  Surgeon: [name], MD;  [facility];  Service: Endoscopy;  Laterality: N/A;   EYE SURGERY     FOOT SURGERY Left 2005   "fixed bone that stuck out in my ankle area"   HEMORRHOID BANDING     IMPLANTABLE CARDIOVERTER DEFIBRILLATOR IMPLANT  09/06/09   BSX dual chamber ICD implanted in [place] for cardiac arrest 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   [place] Right ~ [place] N/A 03/19/2022   Procedure: LEFT HEART CATH AND CORONARY ANGIOGRAPHY;  Surgeon: Martinique, Peter M, MD;  Location: [place] [facility];  Service: Cardiovascular;  Laterality: N/A;   LEFT HEART CATHETERIZATION WITH CORONARY ANGIOGRAM N/A 11/25/2012   demonstrated EF 30%, inferior akinesis with mild hypokinesis of all walls, patent LAD and RCA stents; ostial PDA with 80-90% obstruction with medical therapy recommended   MOHS SURGERY  2008   nose, skin graft   POLYPECTOMY  12/21/2017   Procedure: POLYPECTOMY;  Surgeon: [name], MD;  [facility];  Service: Endoscopy;;   RETINAL DETACHMENT SURGERY Right 2013   TENOLYSIS Right 12/21/2013   Procedure: TENOLYSIS FLEXOR CARPI RADIALIS ,DEBRIDEMENT RIGHT JOINT WRIST,DEBRIDEMENT SCAPHOTRAPEZIAL TRAPEZOID, REPAIR OF EXTENSOR HOOD;  Surgeon: Daryll Brod, MD;  Location: [place];  Service:  Orthopedics;  Laterality: Right;   TOE SURGERY Right 09/2019   3rd toe/hammer toe   V-TACH ABLATION  12/21/2014   VIDEO BRONCHOSCOPY Bilateral 01/09/2016   Procedure: VIDEO BRONCHOSCOPY WITHOUT FLUORO;  Surgeon: Juanito Doom, MD;  [facility];  Service: Cardiopulmonary;  Laterality: Bilateral;    Social History:  reports that he has quit smoking. His smoking use included cigarettes. He has a 55.00 pack-year smoking history. He has never used smokeless tobacco. He reports current alcohol use. He reports that he does not use drugs.  Allergies:  Allergies  Allergen Reactions   Sulfa Antibiotics Hives    (Not in a hospital admission)  Physical Exam: Blood pressure (!) 142/83, pulse 70, temperature 97.8 F (36.6 C), resp. rate 18, height 6' (1.829 m), weight 86.2 kg, SpO2 98 %. General: Pleasant elderly white male  laying on hospital bed, appears stated age, NAD.  HEENT: head -normocephalic, atraumatic; Eyes: PERRLA, no conjunctival injection, anicteric sclerae Neck- Trachea is midline, no thyromegaly or JVD appreciated.  CV- RRR, no lower extremity edema  Pulm- breathing is non-labored ORA  Abd- soft, non-distended, mild mostly subjective tenderness in the lower abdomen/LLQ, umbilical hernia that is soft, LIH that is large and unable to be reduced - minimally tender, no overlying skin changes. GU- normal external male anatomy, large inguinal hernia as above MSK- UE/LE symmetrical; significant ecchymosis of BUE  Neuro- non-focal exam, moving all extremities, gait not assessed  Psych- Alert and Oriented x3 with appropriate affect Skin: warm and dry   Results for orders placed or performed during the hospital encounter of 04/08/22 (from the past 48 hour(s))  CBC with Differential     Status: Abnormal   Collection Time: 04/08/22  6:57 PM  Result Value Ref Range   WBC 5.0 4.0 - 10.5 K/uL   RBC 3.80 (L) 4.22 - 5.81 MIL/uL   Hemoglobin 12.9 (L) 13.0 - 17.0 g/dL   HCT 39.9  39.0 - 52.0 %   MCV 105.0 (H) 80.0 - 100.0 fL   MCH 33.9 26.0 - 34.0 pg   MCHC 32.3 30.0 - 36.0 g/dL   RDW 17.2 (H) 11.5 - 15.5 %   Platelets 185 150 - 400 K/uL   nRBC 0.0 0.0 - 0.2 %   Neutrophils Relative % 67 %   Neutro Abs 3.4 1.7 - 7.7 K/uL   Lymphocytes Relative 20 %   Lymphs Abs 1.0 0.7 - 4.0 K/uL   Monocytes Relative 9 %   Monocytes Absolute 0.5 0.1 - 1.0 K/uL   Eosinophils Relative 1 %   Eosinophils Absolute 0.1 0.0 - 0.5 K/uL   Basophils Relative 1 %   Basophils Absolute 0.0 0.0 - 0.1 K/uL   Immature Granulocytes 2 %   Abs Immature Granulocytes 0.09 (H) 0.00 - 0.07 K/uL    Comment: Performed at [facility], [address], [place], [place] [address]  Comprehensive metabolic panel     Status: None   Collection Time: 04/08/22  6:57 PM  Result Value Ref Range   Sodium 136 135 - 145 mmol/L   Potassium 3.9 3.5 - 5.1 mmol/L   Chloride 99 98 - 111 mmol/L   CO2 27 22 - 32 mmol/L   Glucose, Bld 92 70 - 99 mg/dL    Comment: Glucose reference range applies only to samples taken after fasting for at least 8 hours.   BUN 9 8 - 23 mg/dL   Creatinine, Ser 0.75 0.61 - 1.24 mg/dL   Calcium 9.4 8.9 - 10.3 mg/dL   Total Protein 7.0 6.5 - 8.1 g/dL   Albumin 3.6 3.5 - 5.0 g/dL   AST 25 15 - 41 U/L   ALT 23 0 - 44 U/L   Alkaline Phosphatase 63 38 - 126 U/L   Total Bilirubin 0.8 0.3 - 1.2 mg/dL   GFR, Estimated >60 >60 mL/min    Comment: (NOTE) Calculated using the CKD-EPI Creatinine Equation (2021)    Anion gap 10 5 - 15    Comment: Performed at [facility] [address] [place], [place] [address]  Lipase, blood     Status: None   Collection Time: 04/08/22  6:57 PM  Result Value Ref Range   Lipase 37 11 - 51 U/L    Comment: Performed at [facility] [address], [place], [place] [address]   CT ABDOMEN PELVIS W  CONTRAST  Result Date: 04/08/2022 CLINICAL DATA:  Right lower quadrant abdominal pain radiating to the right groin. History of inguinal  hernia. EXAM: CT ABDOMEN AND PELVIS WITH CONTRAST TECHNIQUE: Multidetector CT imaging of the abdomen and pelvis was performed using the standard protocol following bolus administration of intravenous contrast. RADIATION DOSE REDUCTION: This exam was performed according to the departmental dose-optimization program which includes automated exposure control, adjustment of the mA and/or kV according to patient size and/or use of iterative reconstruction technique. CONTRAST:  17m OMNIPAQUE IOHEXOL 350 MG/ML SOLN COMPARISON:  03/08/2022 FINDINGS: Lower chest: Small hiatal hernia.  No acute abnormality Hepatobiliary: Pneumobilia. Unremarkable gallbladder. No suspicious focal hepatic lesion. Pancreas: Unremarkable. Spleen: Unremarkable. Adrenals/Urinary Tract: Unchanged adrenal glands. No obstructing urinary calculi or hydronephrosis. The bladder is not well distended. Stomach/Bowel: Herniation of the sigmoid and distal descending colon into the large left inguinal hernia. There is sigmoid diverticula with wall thickening of the sigmoid colon within the hernia sac. Additional herniation of a loop of small bowel into the left inguinal hernia sac. There is fecalization of small bowel contents within the hernia sac and borderline dilation of the herniated loop of small bowel. Normal appendix. Redemonstrated rectal wall thickening though this is decreased from prior. Vascular/Lymphatic: Aortic atherosclerotic calcification. Focal dilation of the infrarenal abdominal aorta measuring 34 mm in diameter. No suspicious lymphadenopathy. Reproductive: Unremarkable. Other: Large left and small right inguinal hernias. No free intraperitoneal air. Musculoskeletal: No acute fracture. Right THA. Which versus preference IMPRESSION: 1. Large left inguinal hernia containing sigmoid colon and a loop of small bowel 2. The herniated loop of small bowel is mildly distended and contains feculent material compatible with partial obstruction. 3.  The herniated loop of sigmoid colon contains multiple diverticula with mild wall thickening suggestive of diverticulitis/colitis. 4. Redemonstrated proctitis though this is improved from 03/08/2022. 5. Small hiatal hernia. 6. Infrarenal abdominal aorta measuring 34 mm in maximum diameter. Recommend follow-up ultrasound every 3 years. This recommendation follows ACR consensus guidelines: White Paper of the ACR Incidental Findings Committee II on Vascular Findings. J Am Coll Radiol 2013; 10:789-794. 7. Aortic Atherosclerosis (ICD10-I70.0). Electronically Signed   By: TPlacido SouM.D.   On: 04/08/2022 22:37    Assessment/Plan Enlarging, chronically incarcerated LIH, possible pSBO 82y/o M with MMP as listed below who has a large, chronically incarcerated LIH. Hernia now contains small bowel, which is new, and has radiographic signs of pSBO. Clinically he is not obstructed. I am unable to reduce his hernia in the ED. I am concerned he needs an operation to avoid further SBO or small bowel ischemia. Recommend admission, hold anticoagulation, cardiology consult, and I will review surgical plans with my attending.    FEN - NPO, IVF VTE - SCD's  ID - None indicated  [facility]   CAD  PMH Vtach s/p ICD COPD Afib on eliquis - hold eliquis  HLD PMH symptomatic cholelithiasis Hiatal hernia     I reviewed nursing notes, ED provider notes, hospitalist notes, last 24 h vitals and pain scores, last 48 h intake and output, last 24 h labs and trends, and last 24 h imaging results.  EJill Alexanders [facility] 04/09/2022, 10:22 AM Please see Amion for pager number during day hours 7:00am-4:30pm or 7:00am -11:30am on weekends

## 2022-04-09 NOTE — ED Provider Notes (Signed)
[facility] EMERGENCY DEPARTMENT Provider Note   CSN: [id number] Arrival date & time: 04/08/22  1857     History  Chief Complaint  Patient presents with   Abdominal Pain    Steven Guzman is a 82 y.o. male.  HPI 82 year old male with a complex past medical history which includes systolic CHF, CAD, CKD, COPD, A-fib on Eliquis presents with left her inguinal hernia pain as well as abdominal pain.  He had abdominal pain similar to this a couple different times and has come to the ER.  He was diagnosed with a hernia on the second visit and told to follow-up with general surgery.  However he has never really had pain in his left inguinal area.  Yesterday started having abdominal pain and then noticed groin pain.  Seem to transiently get better but then came back and so he presents today for evaluation.  No vomiting.  Pain is currently mild, 2-3.  Home Medications Prior to Admission medications   Medication Sig Start Date End Date Taking? Authorizing Provider  albuterol (PROVENTIL) (2.5 MG/3ML) 0.083% nebulizer solution USE 1 VIAL VIA NEBULIZER EVERY 6 HOURS AS NEEDED FOR WHEEZING OR SHORTNESS OF BREATH Patient taking differently: Take 2.5 mg by nebulization See admin instructions. USE 1 VIAL VIA NEBULIZER EVERY 6 HOURS AS NEEDED FOR WHEEZING OR SHORTNESS OF BREATH 07/18/19  Yes Olalere, Adewale A, MD  amiodarone (PACERONE) 200 MG tablet TAKE ONE TABLET BY MOUTH DAILY 04/08/22  Yes Croitoru, Mihai, MD  apixaban (ELIQUIS) 5 MG TABS tablet Take 1 tablet (5 mg total) by mouth 2 (two) times daily. 10/10/21  Yes Belva Crome, MD  Artificial Tear Solution (SOOTHE XP OP) Place 2 drops into both eyes daily as needed (dry eyes).   Yes [provider]  ascorbic acid (VITAMIN C) 500 MG tablet Take 1 tablet by mouth daily. 02/10/22  Yes [provider]  aspirin EC 81 MG tablet Take 1 tablet (81 mg total) by mouth daily. Swallow whole. 03/21/22  Yes Almyra Deforest, PA   bisacodyl (DULCOLAX) 5 MG EC tablet Take 5 mg by mouth. 03/30/22 04/29/22 Yes [provider]  busPIRone (BUSPAR) 15 MG tablet Take 15 mg by mouth 2 (two) times daily. Patient taking 10 mg by mouth twice daily   Yes [provider]  calcium citrate (CALCITRATE - DOSED IN MG ELEMENTAL CALCIUM) 950 (200 Ca) MG tablet Take 200 mg of elemental calcium by mouth daily. 02/10/22  Yes [provider]  carvedilol (COREG) 3.125 MG tablet Take 1 tablet (3.125 mg total) by mouth 2 (two) times daily. 03/11/22  Yes Belva Crome, MD  cetirizine (ZYRTEC) 10 MG tablet Take 1 tablet by mouth daily. 12/16/17  Yes [provider]  Cyanocobalamin (VITAMIN B-12) 5000 MCG SUBL Place under the tongue daily.   Yes [provider]  empagliflozin (JARDIANCE) 10 MG TABS tablet Take 1 tablet (10 mg total) by mouth daily. 03/21/22  Yes Almyra Deforest, PA  escitalopram (LEXAPRO) 20 MG tablet Take 20 mg by mouth daily. Take 1/2 tablet (10 mg total ) by mouth daily   Yes [provider]  Fenofibric Acid 35 MG TABS Take 35 mg by mouth daily.   Yes [provider]  fluticasone (FLONASE) 50 MCG/ACT nasal spray Place 2 sprays into both nostrils daily. 02/13/21  Yes Fargo, Amy E, NP  furosemide (LASIX) 20 MG tablet Take 1 tablet (20 mg total) by mouth daily as needed for fluid or edema.  03/20/22  Yes Almyra Deforest, PA  gabapentin (NEURONTIN) 300 MG capsule Take 1 capsule (300 mg total) by mouth 2 (two) times daily. 03/18/22  Yes Fargo, Amy E, NP  guaiFENesin (MUCINEX) 600 MG 12 hr tablet Take 1 tablet by mouth 2 (two) times daily.   Yes [provider]  HYDROcodone-acetaminophen (NORCO/VICODIN) 5-325 MG tablet Take 1 tablet by mouth 2 (two) times daily as needed for up to 5 days for moderate pain. 04/06/22 04/11/22 Yes Fargo, Amy E, NP  Ipratropium-Albuterol (COMBIVENT RESPIMAT) 20-100 MCG/ACT AERS respimat Inhale 1 puff into the lungs every 6 (six) hours. Shortness of breath or  wheezing 09/18/21  Yes Fargo, Amy E, NP  IRON PO Take 1 tablet by mouth daily.   Yes [provider]  MAGNESIUM-OXIDE 400 (241.3 Mg) MG tablet TAKE 1 TABSULE BY MOUTH DAILY Patient taking differently: Take 400 mg by mouth daily. 11/24/18  Yes Hoyt Koch, MD  mexiletine (MEXITIL) 200 MG capsule Take 1 capsule (200 mg total) by mouth 2 (two) times daily. 10/10/21  Yes Belva Crome, MD  Multiple Vitamins-Minerals (CENTRUM ADULTS PO) Take by mouth daily.   Yes [provider]  nitroGLYCERIN (NITROSTAT) 0.4 MG SL tablet Place 1 tablet (0.4 mg total) under the tongue every 5 (five) minutes as needed for chest pain. DISSOLVE 1 TABLET UNDER THE TONGUE EVERY 5 MINUTES FOR 3 DOSES 05/08/21  Yes Belva Crome, MD  omeprazole (PRILOSEC) 20 MG capsule Take 20 mg by mouth daily.    Yes [provider]  polyethylene glycol powder (GLYCOLAX/MIRALAX) 17 GM/SCOOP powder Take 1 capful in 8 ounces of fluid each morning 03/08/22  Yes Luster Landsberg, MD  rosuvastatin (CRESTOR) 40 MG tablet Take 1 tablet (40 mg total) by mouth daily. Patient taking differently: Take 40 mg by mouth at bedtime. 06/05/21  Yes [name], MD  sacubitril-valsartan (ENTRESTO) 24-26 MG Take 1 tablet by mouth 2 (two) times daily. 03/20/22  Yes Meng, Isaac Laud, PA  STIOLTO RESPIMAT 2.5-2.5 MCG/ACT AERS Inhale 2 puffs into the lungs daily. 09/22/21  Yes [provider]  buPROPion (WELLBUTRIN) 75 MG tablet TAKE ONE TABLET BY MOUTH DAILY Patient not taking: Reported on 04/09/2022 09/10/21   [name] E, NP  lidocaine (LIDODERM) 5 % Place 1 patch onto the skin daily. Remove & Discard patch within 12 hours or as directed by MD Patient not taking: Reported on 04/09/2022 04/06/22   Yvonna Alanis, NP  mupirocin ointment (BACTROBAN) 2 % Apply 1 application. topically 2 (two) times daily. Apply to lower leg wounds daily prn Patient not taking: Reported on 04/09/2022 07/17/21   Yvonna Alanis, NP  Respiratory Therapy  Supplies (FLUTTER) DEVI Use as directed Patient taking differently: 1 each by Other route See admin instructions. Use as directed 03/16/17   Juanito Doom, MD  Sennosides-Docusate Sodium (STOOL SOFTENER/LAXATIVE PO) Take 2 tablets by mouth daily.    [provider]  Spacer/Aero-Holding Chambers (OPTICHAMBER DIAMOND) [place] optichamber [facility] Patient taking differently: 1 each by Other route See admin instructions. optichamber [facility] 09/12/19   Olalere, Adewale A, MD  tamsulosin (FLOMAX) 0.4 MG CAPS capsule TAKE ONE CAPSULE BY MOUTH DAILY AFTER SUPPER Patient taking differently: Take 0.4 mg by mouth See admin instructions. TAKE ONE CAPSULE BY MOUTH DAILY AFTER SUPPER 06/30/21   Yvonna Alanis, NP      Allergies    Sulfa antibiotics    Review of Systems   Review of Systems  Gastrointestinal:  Positive  for abdominal pain. Negative for vomiting.    Physical Exam Updated Vital Signs BP (!) 152/85   Pulse 82   Temp 98.5 F (36.9 C) (Oral)   Resp 18   Ht 6' (1.829 m)   Wt 86.2 kg   SpO2 97%   BMI 25.77 kg/m  Physical Exam Vitals and nursing note reviewed.  Constitutional:      Appearance: He is well-developed.  HENT:     Head: Normocephalic and atraumatic.  Pulmonary:     Effort: Pulmonary effort is normal.  Abdominal:     Palpations: Abdomen is soft.     Tenderness: There is abdominal tenderness in the left lower quadrant.     Hernia: A hernia is present. Hernia is present in the left inguinal area (large, minimal tenderness. no erythema).  Skin:    General: Skin is warm and dry.  Neurological:     Mental Status: He is alert.     ED Results / Procedures / Treatments   Labs (all labs ordered are listed, but only abnormal results are displayed) Labs Reviewed  CBC WITH DIFFERENTIAL/PLATELET - Abnormal; Notable for the following components:      Result Value   RBC 3.80 (*)    Hemoglobin 12.9 (*)    MCV 105.0 (*)    RDW 17.2 (*)    Abs Immature Granulocytes 0.09 (*)     All other components within normal limits  URINALYSIS, ROUTINE W REFLEX MICROSCOPIC - Abnormal; Notable for the following components:   Glucose, UA >=500 (*)    Leukocytes,Ua TRACE (*)    Bacteria, UA FEW (*)    All other components within normal limits  COMPREHENSIVE METABOLIC PANEL  LIPASE, BLOOD  HEPARIN LEVEL (UNFRACTIONATED)  APTT    EKG None  Radiology CT ABDOMEN PELVIS W CONTRAST  Result Date: 04/08/2022 CLINICAL DATA:  Right lower quadrant abdominal pain radiating to the right groin. History of inguinal hernia. EXAM: CT ABDOMEN AND PELVIS WITH CONTRAST TECHNIQUE: Multidetector CT imaging of the abdomen and pelvis was performed using the standard protocol following bolus administration of intravenous contrast. RADIATION DOSE REDUCTION: This exam was performed according to the departmental dose-optimization program which includes automated exposure control, adjustment of the mA and/or kV according to patient size and/or use of iterative reconstruction technique. CONTRAST:  54m OMNIPAQUE IOHEXOL 350 MG/ML SOLN COMPARISON:  03/08/2022 FINDINGS: Lower chest: Small hiatal hernia.  No acute abnormality Hepatobiliary: Pneumobilia. Unremarkable gallbladder. No suspicious focal hepatic lesion. Pancreas: Unremarkable. Spleen: Unremarkable. Adrenals/Urinary Tract: Unchanged adrenal glands. No obstructing urinary calculi or hydronephrosis. The bladder is not well distended. Stomach/Bowel: Herniation of the sigmoid and distal descending colon into the large left inguinal hernia. There is sigmoid diverticula with wall thickening of the sigmoid colon within the hernia sac. Additional herniation of a loop of small bowel into the left inguinal hernia sac. There is fecalization of small bowel contents within the hernia sac and borderline dilation of the herniated loop of small bowel. Normal appendix. Redemonstrated rectal wall thickening though this is decreased from prior. Vascular/Lymphatic: Aortic  atherosclerotic calcification. Focal dilation of the infrarenal abdominal aorta measuring 34 mm in diameter. No suspicious lymphadenopathy. Reproductive: Unremarkable. Other: Large left and small right inguinal hernias. No free intraperitoneal air. Musculoskeletal: No acute fracture. Right THA. Which versus preference IMPRESSION: 1. Large left inguinal hernia containing sigmoid colon and a loop of small bowel 2. The herniated loop of small bowel is mildly distended and contains feculent material compatible with partial obstruction. 3.  The herniated loop of sigmoid colon contains multiple diverticula with mild wall thickening suggestive of diverticulitis/colitis. 4. Redemonstrated proctitis though this is improved from 03/08/2022. 5. Small hiatal hernia. 6. Infrarenal abdominal aorta measuring 34 mm in maximum diameter. Recommend follow-up ultrasound every 3 years. This recommendation follows ACR consensus guidelines: White Paper of the ACR Incidental Findings Committee II on Vascular Findings. J Am Coll Radiol 2013; 10:789-794. 7. Aortic Atherosclerosis (ICD10-I70.0). Electronically Signed   By: Placido Sou M.D.   On: 04/08/2022 22:37    Procedures Procedures    Medications Ordered in ED Medications  acetaminophen (TYLENOL) tablet 650 mg (has no administration in time range)    Or  acetaminophen (TYLENOL) suppository 650 mg (has no administration in time range)  albuterol (PROVENTIL) (2.5 MG/3ML) 0.083% nebulizer solution 2.5 mg (has no administration in time range)  heparin ADULT infusion 100 units/mL (25000 units/238m) (1,250 Units/hr Intravenous New Bag/Given 04/09/22 1326)  iohexol (OMNIPAQUE) 350 MG/ML injection 75 mL (75 mLs Intravenous Contrast Given 04/08/22 2211)    ED Course/ Medical Decision Making/ A&P                           Medical Decision Making Amount and/or Complexity of Data Reviewed Labs:     Details: Chronic anemia.  No significant electrolyte disturbance. Radiology:  independent interpretation performed.    Details: CT with large left inguinal hernia with bowel.  Risk Decision regarding hospitalization.   Patient is well-appearing.  He does not have severe pain or tenderness on exam.  Thus I think a strangulated hernia is less likely.  However the CT findings is concerning and so general surgery was consulted.  They would like him admitted to the medical service and will consider urgent but not emergent surgery after cardiac clearance and holding his Eliquis.  Discussed with Dr. STamala Julianfor admission.        Final Clinical Impression(s) / ED Diagnoses Final diagnoses:  Left inguinal hernia    Rx / DC Orders ED Discharge Orders     None         GSherwood Gambler MD 04/09/22 1521

## 2022-04-09 NOTE — ED Notes (Signed)
ED TO INPATIENT HANDOFF REPORT  ED Nurse Name and Phone #: Andee Poles [phone]   S Name/Age/Gender Steven Guzman 82 y.o. male Room/Bed: 035C/035C  Code Status   Code Status: Full Code  Home/SNF/Other Home Patient oriented to: self, place, time, and situation Is this baseline? Yes   Triage Complete: Triage complete  Chief Complaint Inguinal hernia [K40.90]  Triage Note Pt c/o recurrent abdominal pain that started earlier today. Initially mid abdomen and left groin area. Pt states concern for inguinal hernia. Denies NVD.    Allergies Allergies  Allergen Reactions   Sulfa Antibiotics Hives    Level of Care/Admitting Diagnosis ED Disposition     ED Disposition  Admit   Condition  --   Comment  Hospital Area: [facility] [[id number]]  Level of Care: Telemetry Medical [104]  May admit patient to [facility] or [facility] if equivalent level of care is available:: No  Covid Evaluation: Asymptomatic - no recent exposure (last 10 days) testing not required  Diagnosis: Inguinal hernia [829562]  Admitting Physician: Norval Morton [[id number]]  Attending Physician: Norval Morton [[id number]]  Certification:: I certify this patient will need inpatient services for at least 2 midnights  Estimated Length of Stay: 2          B Medical/Surgery History Past Medical History:  Diagnosis Date   AICD (automatic cardioverter/defibrillator) present    Aneurysm ([place])    a. Aneurysmal infrarenal aorta up to 33 mm on CT 10/2014, recommended f/u due 10/2017   Anginal pain ([place])    Anxiety    Basal cell carcinoma of nose    S/P MOHS   Biliary acute pancreatitis    CAD (coronary artery disease)    a. s/p MI in 1994 with PCI to LAD at that time b. cath 10/2012 demonstrated EF 30%, inferior akinesis with mild hypokinesis of all walls, patent LAD and RCA stents; ostial PDA with 80-90% obstruction with medical therapy recommended    Chronic systolic CHF (congestive heart  failure) (HCC)    EF 30 to 35 % as of 09/2014.    CKD (chronic kidney disease), stage III ([place])    Complication of anesthesia 10/2014   "had to have defibrillator w/ERCP"   COPD (chronic obstructive pulmonary disease) ([place])    a. followed by pulmonary, COPD GOLD stage II   Depression    Diverticulosis of colon 07/2014   noted on CT   GERD (gastroesophageal reflux disease)    Hiatal hernia    Hyperglycemia 10/2012.   Hyperlipidemia    Hypertension    Myocardial infarction [facility]) 1994; 2011   Pneumonia 1946; 2015   Prostate enlargement 07/2014   observed on CT   Tobacco abuse    Ventricular tachycardia ([place])    a. 08/2009 s/p BSX E110 Teligen 100 AICD, ser#: [id number];  b. 08/2008 VT req ATP - detection reprogrammed from 160 to 150. c. EPS and VT ablation by Dr. Lovena Le 12/21/2014   Past Surgical History:  Procedure Laterality Date   BIOPSY  12/21/2017   Procedure: BIOPSY;  Surgeon: [name], MD;  [facility];  Service: Endoscopy;;   CATARACT EXTRACTION W/ INTRAOCULAR LENS  IMPLANT, BILATERAL Bilateral ~ 2011   COLONOSCOPY     COLONOSCOPY WITH PROPOFOL N/A 12/21/2017   Procedure: COLONOSCOPY WITH PROPOFOL;  Surgeon: [name], MD;  [facility];  Service: Endoscopy;  Laterality: N/A;   ELECTROPHYSIOLOGIC STUDY N/A 12/21/2014   Procedure: V Tach Ablation;  Surgeon: [name], MD;  Location: [facility];  Service: Cardiovascular;  Laterality: N/A;   ERCP N/A 11/16/2014   Procedure: ENDOSCOPIC RETROGRADE CHOLANGIOPANCREATOGRAPHY (ERCP);  Surgeon: [name], MD;  Location: [facility];  Service: Endoscopy;  Laterality: N/A;   ESOPHAGOGASTRODUODENOSCOPY (EGD) WITH PROPOFOL N/A 12/21/2017   Procedure: ESOPHAGOGASTRODUODENOSCOPY (EGD) WITH PROPOFOL;  Surgeon: [name], MD;  [facility];  Service: Endoscopy;  Laterality: N/A;   EYE SURGERY     FOOT SURGERY Left 2005   "fixed bone that stuck out in my ankle area"   HEMORRHOID BANDING      IMPLANTABLE CARDIOVERTER DEFIBRILLATOR IMPLANT  09/06/09   BSX dual chamber ICD implanted in [place] for cardiac arrest 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   [place] Right ~ [place] N/A 03/19/2022   Procedure: LEFT HEART CATH AND CORONARY ANGIOGRAPHY;  Surgeon: Martinique, Peter M, MD;  Location: [facility];  Service: Cardiovascular;  Laterality: N/A;   LEFT HEART CATHETERIZATION WITH CORONARY ANGIOGRAM N/A 11/25/2012   demonstrated EF 30%, inferior akinesis with mild hypokinesis of all walls, patent LAD and RCA stents; ostial PDA with 80-90% obstruction with medical therapy recommended   MOHS SURGERY  2008   nose, skin graft   POLYPECTOMY  12/21/2017   Procedure: POLYPECTOMY;  Surgeon: [name], MD;  [facility];  Service: Endoscopy;;   RETINAL DETACHMENT SURGERY Right 2013   TENOLYSIS Right 12/21/2013   Procedure: TENOLYSIS FLEXOR CARPI RADIALIS ,DEBRIDEMENT RIGHT JOINT WRIST,DEBRIDEMENT SCAPHOTRAPEZIAL TRAPEZOID, REPAIR OF EXTENSOR HOOD;  Surgeon: Daryll Brod, MD;  Location: [facility];  Service: Orthopedics;  Laterality: Right;   TOE SURGERY Right 09/2019   3rd toe/hammer toe   V-TACH ABLATION  12/21/2014   VIDEO BRONCHOSCOPY Bilateral 01/09/2016   Procedure: VIDEO BRONCHOSCOPY WITHOUT FLUORO;  Surgeon: Juanito Doom, MD;  [facility];  Service: Cardiopulmonary;  Laterality: Bilateral;     A IV Location/Drains/Wounds Patient Lines/Drains/Airways Status     Active Line/Drains/Airways     Name Placement date Placement time Site Days   Peripheral IV 04/08/22 20 G 1.16" Right Antecubital 04/08/22  2049  Antecubital  1            Intake/Output Last 24 hours No intake or output data in the 24 hours ending 04/09/22 1248  Labs/Imaging Results for orders placed or performed during the hospital encounter of 04/08/22 (from the past 48 hour(s))  CBC with Differential     Status: Abnormal    Collection Time: 04/08/22  6:57 PM  Result Value Ref Range   WBC 5.0 4.0 - 10.5 K/uL   RBC 3.80 (L) 4.22 - 5.81 MIL/uL   Hemoglobin 12.9 (L) 13.0 - 17.0 g/dL   HCT 39.9 39.0 - 52.0 %   MCV 105.0 (H) 80.0 - 100.0 fL   MCH 33.9 26.0 - 34.0 pg   MCHC 32.3 30.0 - 36.0 g/dL   RDW 17.2 (H) 11.5 - 15.5 %   Platelets 185 150 - 400 K/uL   nRBC 0.0 0.0 - 0.2 %   Neutrophils Relative % 67 %   Neutro Abs 3.4 1.7 - 7.7 K/uL   Lymphocytes Relative 20 %   Lymphs Abs 1.0 0.7 - 4.0 K/uL   Monocytes Relative 9 %   Monocytes Absolute 0.5 0.1 - 1.0 K/uL   Eosinophils Relative 1 %   Eosinophils Absolute 0.1 0.0 - 0.5 K/uL   Basophils  Relative 1 %   Basophils Absolute 0.0 0.0 - 0.1 K/uL   Immature Granulocytes 2 %   Abs Immature Granulocytes 0.09 (H) 0.00 - 0.07 K/uL    Comment: Performed at [facility], [place] [address] [place], [place] [address]  Comprehensive metabolic panel     Status: None   Collection Time: 04/08/22  6:57 PM  Result Value Ref Range   Sodium 136 135 - 145 mmol/L   Potassium 3.9 3.5 - 5.1 mmol/L   Chloride 99 98 - 111 mmol/L   CO2 27 22 - 32 mmol/L   Glucose, Bld 92 70 - 99 mg/dL    Comment: Glucose reference range applies only to samples taken after fasting for at least 8 hours.   BUN 9 8 - 23 mg/dL   Creatinine, Ser 0.75 0.61 - 1.24 mg/dL   Calcium 9.4 8.9 - 10.3 mg/dL   Total Protein 7.0 6.5 - 8.1 g/dL   Albumin 3.6 3.5 - 5.0 g/dL   AST 25 15 - 41 U/L   ALT 23 0 - 44 U/L   Alkaline Phosphatase 63 38 - 126 U/L   Total Bilirubin 0.8 0.3 - 1.2 mg/dL   GFR, Estimated >60 >60 mL/min    Comment: (NOTE) Calculated using the CKD-EPI Creatinine Equation (2021)    Anion gap 10 5 - 15    Comment: Performed at [facility] [address], [place], [place] [address]  Lipase, blood     Status: None   Collection Time: 04/08/22  6:57 PM  Result Value Ref Range   Lipase 37 11 - 51 U/L    Comment: Performed at [facility] [address], [place], [place]  [address]  Urinalysis, Routine w reflex microscopic Urine, Clean Catch     Status: Abnormal   Collection Time: 04/09/22 11:00 AM  Result Value Ref Range   Color, Urine YELLOW YELLOW   APPearance CLEAR CLEAR   Specific Gravity, Urine 1.028 1.005 - 1.030   pH 5.0 5.0 - 8.0   Glucose, UA >=500 (A) NEGATIVE mg/dL   Hgb urine dipstick NEGATIVE NEGATIVE   Bilirubin Urine NEGATIVE NEGATIVE   Ketones, ur NEGATIVE NEGATIVE mg/dL   Protein, ur NEGATIVE NEGATIVE mg/dL   Nitrite NEGATIVE NEGATIVE   Leukocytes,Ua TRACE (A) NEGATIVE   RBC / HPF 0-5 0 - 5 RBC/hpf   WBC, UA 21-50 0 - 5 WBC/hpf   Bacteria, UA FEW (A) NONE SEEN   Squamous Epithelial / HPF 0-5 0 - 5 /HPF    Comment: Performed at [facility], [place] [address] [place],  [address]   CT ABDOMEN PELVIS W CONTRAST  Result Date: 04/08/2022 CLINICAL DATA:  Right lower quadrant abdominal pain radiating to the right groin. History of inguinal hernia. EXAM: CT ABDOMEN AND PELVIS WITH CONTRAST TECHNIQUE: Multidetector CT imaging of the abdomen and pelvis was performed using the standard protocol following bolus administration of intravenous contrast. RADIATION DOSE REDUCTION: This exam was performed according to the departmental dose-optimization program which includes automated exposure control, adjustment of the mA and/or kV according to patient size and/or use of iterative reconstruction technique. CONTRAST:  38m OMNIPAQUE IOHEXOL 350 MG/ML SOLN COMPARISON:  03/08/2022 FINDINGS: Lower chest: Small hiatal hernia.  No acute abnormality Hepatobiliary: Pneumobilia. Unremarkable gallbladder. No suspicious focal hepatic lesion. Pancreas: Unremarkable. Spleen: Unremarkable. Adrenals/Urinary Tract: Unchanged adrenal glands. No obstructing urinary calculi or hydronephrosis. The bladder is not well distended. Stomach/Bowel: Herniation of the sigmoid and  distal descending colon into the large left inguinal hernia. There is sigmoid diverticula with wall  thickening of the sigmoid colon within the hernia sac. Additional herniation of a loop of small bowel into the left inguinal hernia sac. There is fecalization of small bowel contents within the hernia sac and borderline dilation of the herniated loop of small bowel. Normal appendix. Redemonstrated rectal wall thickening though this is decreased from prior. Vascular/Lymphatic: Aortic atherosclerotic calcification. Focal dilation of the infrarenal abdominal aorta measuring 34 mm in diameter. No suspicious lymphadenopathy. Reproductive: Unremarkable. Other: Large left and small right inguinal hernias. No free intraperitoneal air. Musculoskeletal: No acute fracture. Right THA. Which versus preference IMPRESSION: 1. Large left inguinal hernia containing sigmoid colon and a loop of small bowel 2. The herniated loop of small bowel is mildly distended and contains feculent material compatible with partial obstruction. 3. The herniated loop of sigmoid colon contains multiple diverticula with mild wall thickening suggestive of diverticulitis/colitis. 4. Redemonstrated proctitis though this is improved from 03/08/2022. 5. Small hiatal hernia. 6. Infrarenal abdominal aorta measuring 34 mm in maximum diameter. Recommend follow-up ultrasound every 3 years. This recommendation follows ACR consensus guidelines: White Paper of the ACR Incidental Findings Committee II on Vascular Findings. J Am Coll Radiol 2013; 10:789-794. 7. Aortic Atherosclerosis (ICD10-I70.0). Electronically Signed   By: Placido Sou M.D.   On: 04/08/2022 22:37    Pending Labs Unresulted Labs (From admission, onward)     Start     Ordered   04/10/22 3845  Basic metabolic panel  Tomorrow morning,   R        04/09/22 1133   04/10/22 0500  CBC  Tomorrow morning,   R        04/09/22 1133            Vitals/Pain Today's Vitals   04/09/22 0804 04/09/22 0930 04/09/22 1030 04/09/22 1031  BP: (!) 143/89 (!) 142/83 (!) 141/85   Pulse: 74 70 67    Resp: '18 18 16   '$ Temp:    97.9 F (36.6 C)  TempSrc:    Oral  SpO2: 90% 98% 99%   Weight:      Height:      PainSc: 3        Isolation Precautions No active isolations  Medications Medications  acetaminophen (TYLENOL) tablet 650 mg (has no administration in time range)    Or  acetaminophen (TYLENOL) suppository 650 mg (has no administration in time range)  albuterol (PROVENTIL) (2.5 MG/3ML) 0.083% nebulizer solution 2.5 mg (has no administration in time range)  iohexol (OMNIPAQUE) 350 MG/ML injection 75 mL (75 mLs Intravenous Contrast Given 04/08/22 2211)    Mobility walks with device Low fall risk   Focused Assessments    R Recommendations: See Admitting Provider Note  Report given to:   Additional Notes:

## 2022-04-10 ENCOUNTER — Inpatient Hospital Stay (HOSPITAL_COMMUNITY): Payer: Medicare Other | Admitting: Anesthesiology

## 2022-04-10 ENCOUNTER — Encounter (HOSPITAL_COMMUNITY): Payer: Self-pay | Admitting: Internal Medicine

## 2022-04-10 ENCOUNTER — Encounter (HOSPITAL_COMMUNITY)
Admission: EM | Disposition: A | Discharge status: Home / Self Care | Payer: Self-pay | Source: Home / Self Care | Attending: Internal Medicine

## 2022-04-10 ENCOUNTER — Other Ambulatory Visit: Payer: Self-pay

## 2022-04-10 DIAGNOSIS — I255 Ischemic cardiomyopathy: Secondary | ICD-10-CM | POA: Diagnosis not present

## 2022-04-10 DIAGNOSIS — I5022 Chronic systolic (congestive) heart failure: Secondary | ICD-10-CM | POA: Diagnosis not present

## 2022-04-10 DIAGNOSIS — K56609 Unspecified intestinal obstruction, unspecified as to partial versus complete obstruction: Secondary | ICD-10-CM

## 2022-04-10 DIAGNOSIS — J449 Chronic obstructive pulmonary disease, unspecified: Secondary | ICD-10-CM

## 2022-04-10 DIAGNOSIS — K4030 Unilateral inguinal hernia, with obstruction, without gangrene, not specified as recurrent: Secondary | ICD-10-CM

## 2022-04-10 DIAGNOSIS — I714 Abdominal aortic aneurysm, without rupture, unspecified: Secondary | ICD-10-CM | POA: Diagnosis not present

## 2022-04-10 DIAGNOSIS — I480 Paroxysmal atrial fibrillation: Secondary | ICD-10-CM | POA: Diagnosis not present

## 2022-04-10 DIAGNOSIS — Z87891 Personal history of nicotine dependence: Secondary | ICD-10-CM

## 2022-04-10 DIAGNOSIS — Z95810 Presence of automatic (implantable) cardiac defibrillator: Secondary | ICD-10-CM | POA: Diagnosis not present

## 2022-04-10 DIAGNOSIS — Z01810 Encounter for preprocedural cardiovascular examination: Secondary | ICD-10-CM | POA: Diagnosis not present

## 2022-04-10 HISTORY — PX: INGUINAL HERNIA REPAIR: SHX194

## 2022-04-10 LAB — SURGICAL PCR SCREEN
MRSA, PCR: NEGATIVE
Staphylococcus aureus: NEGATIVE

## 2022-04-10 LAB — CBC
HCT: 36 % — ABNORMAL LOW (ref 39.0–52.0)
Hemoglobin: 11.7 g/dL — ABNORMAL LOW (ref 13.0–17.0)
MCH: 33.9 pg (ref 26.0–34.0)
MCHC: 32.5 g/dL (ref 30.0–36.0)
MCV: 104.3 fL — ABNORMAL HIGH (ref 80.0–100.0)
Platelets: 154 10*3/uL (ref 150–400)
RBC: 3.45 MIL/uL — ABNORMAL LOW (ref 4.22–5.81)
RDW: 17 % — ABNORMAL HIGH (ref 11.5–15.5)
WBC: 3.5 10*3/uL — ABNORMAL LOW (ref 4.0–10.5)
nRBC: 0 % (ref 0.0–0.2)

## 2022-04-10 LAB — BASIC METABOLIC PANEL
Anion gap: 11 (ref 5–15)
BUN: 8 mg/dL (ref 8–23)
CO2: 25 mmol/L (ref 22–32)
Calcium: 8.7 mg/dL — ABNORMAL LOW (ref 8.9–10.3)
Chloride: 100 mmol/L (ref 98–111)
Creatinine, Ser: 0.82 mg/dL (ref 0.61–1.24)
GFR, Estimated: >60 mL/min (ref >60)
Glucose, Bld: 102 mg/dL — ABNORMAL HIGH (ref 70–99)
Potassium: 3.4 mmol/L — ABNORMAL LOW (ref 3.5–5.1)
Sodium: 136 mmol/L (ref 135–145)

## 2022-04-10 LAB — MAGNESIUM: Magnesium: 1.9 mg/dL (ref 1.7–2.4)

## 2022-04-10 SURGERY — REPAIR, HERNIA, INGUINAL, ADULT
Anesthesia: General | Site: Inguinal | Laterality: Left

## 2022-04-10 MED ORDER — POLYETHYLENE GLYCOL 3350 17 G PO PACK
17.0000 g | PACK | Freq: Every day | ORAL | Status: DC
  Administered 2022-04-10 – 2022-04-12 (×3): 17 g via ORAL
  Filled 2022-04-10 (×3): qty 1

## 2022-04-10 MED ORDER — SENNOSIDES-DOCUSATE SODIUM 8.6-50 MG PO TABS
2.0000 | ORAL_TABLET | Freq: Every day | ORAL | Status: DC
  Administered 2022-04-11 – 2022-04-12 (×2): 2 via ORAL
  Filled 2022-04-10 (×3): qty 2

## 2022-04-10 MED ORDER — ONDANSETRON HCL 4 MG/2ML IJ SOLN
INTRAMUSCULAR | Status: AC
  Administered 2022-04-10: 4 mg via INTRAVENOUS
  Filled 2022-04-10: qty 2

## 2022-04-10 MED ORDER — LIDOCAINE 2% (20 MG/ML) 5 ML SYRINGE
INTRAMUSCULAR | Status: AC
  Filled 2022-04-10: qty 5

## 2022-04-10 MED ORDER — ONDANSETRON HCL 4 MG/2ML IJ SOLN: 4.0000 mg | INTRAMUSCULAR | Status: AC

## 2022-04-10 MED ORDER — LACTATED RINGERS IV SOLN: INTRAVENOUS | Status: DC

## 2022-04-10 MED ORDER — SUCCINYLCHOLINE CHLORIDE 200 MG/10ML IV SOSY
PREFILLED_SYRINGE | INTRAVENOUS | Status: DC | PRN
  Administered 2022-04-10: 120 mg via INTRAVENOUS

## 2022-04-10 MED ORDER — POTASSIUM CHLORIDE 10 MEQ/100ML IV SOLN: 10.0000 meq | INTRAVENOUS | Status: DC

## 2022-04-10 MED ORDER — ORAL CARE MOUTH RINSE: 15.0000 mL | Freq: Once | OROMUCOSAL | Status: AC

## 2022-04-10 MED ORDER — BUPIVACAINE HCL (PF) 0.25 % IJ SOLN
INTRAMUSCULAR | Status: DC | PRN
  Administered 2022-04-10: 10 mL

## 2022-04-10 MED ORDER — ROCURONIUM BROMIDE 10 MG/ML (PF) SYRINGE
PREFILLED_SYRINGE | INTRAVENOUS | Status: DC | PRN
  Administered 2022-04-10: 70 mg via INTRAVENOUS

## 2022-04-10 MED ORDER — CHLORHEXIDINE GLUCONATE 0.12 % MT SOLN
OROMUCOSAL | Status: AC
  Administered 2022-04-10: 15 mL via OROMUCOSAL
  Filled 2022-04-10: qty 15

## 2022-04-10 MED ORDER — DEXAMETHASONE SODIUM PHOSPHATE 10 MG/ML IJ SOLN
INTRAMUSCULAR | Status: DC | PRN
  Administered 2022-04-10: 4 mg via INTRAVENOUS

## 2022-04-10 MED ORDER — ONDANSETRON HCL 4 MG/2ML IJ SOLN: 4.0000 mg | Freq: Once | INTRAMUSCULAR | Status: DC | PRN

## 2022-04-10 MED ORDER — PSYLLIUM 95 % PO PACK
1.0000 | PACK | Freq: Every day | ORAL | Status: DC
  Administered 2022-04-10 – 2022-04-12 (×2): 1 via ORAL
  Filled 2022-04-10 (×3): qty 1

## 2022-04-10 MED ORDER — PHENYLEPHRINE HCL-NACL 20-0.9 MG/250ML-% IV SOLN
INTRAVENOUS | Status: DC | PRN
  Administered 2022-04-10: 25 ug/min via INTRAVENOUS
  Administered 2022-04-10: 40 ug/min via INTRAVENOUS

## 2022-04-10 MED ORDER — EPHEDRINE SULFATE-NACL 50-0.9 MG/10ML-% IV SOSY
PREFILLED_SYRINGE | INTRAVENOUS | Status: DC | PRN
  Administered 2022-04-10: 5 mg via INTRAVENOUS

## 2022-04-10 MED ORDER — BUPIVACAINE HCL (PF) 0.25 % IJ SOLN
INTRAMUSCULAR | Status: AC
  Filled 2022-04-10: qty 30

## 2022-04-10 MED ORDER — SODIUM CHLORIDE (PF) 0.9 % IJ SOLN
INTRAMUSCULAR | Status: AC
  Filled 2022-04-10: qty 20

## 2022-04-10 MED ORDER — CHLORHEXIDINE GLUCONATE 0.12 % MT SOLN: 15.0000 mL | Freq: Once | OROMUCOSAL | Status: AC

## 2022-04-10 MED ORDER — AMISULPRIDE (ANTIEMETIC) 5 MG/2ML IV SOLN: 10.0000 mg | Freq: Once | INTRAVENOUS | Status: DC | PRN

## 2022-04-10 MED ORDER — SUGAMMADEX SODIUM 200 MG/2ML IV SOLN
INTRAVENOUS | Status: DC | PRN
  Administered 2022-04-10: 340 mg via INTRAVENOUS

## 2022-04-10 MED ORDER — FENTANYL CITRATE (PF) 250 MCG/5ML IJ SOLN
INTRAMUSCULAR | Status: DC | PRN
  Administered 2022-04-10: 100 ug via INTRAVENOUS
  Administered 2022-04-10 (×2): 25 ug via INTRAVENOUS

## 2022-04-10 MED ORDER — ETOMIDATE 2 MG/ML IV SOLN
INTRAVENOUS | Status: DC | PRN
  Administered 2022-04-10: 13 mg via INTRAVENOUS

## 2022-04-10 MED ORDER — DOCUSATE SODIUM 100 MG PO CAPS
100.0000 mg | ORAL_CAPSULE | Freq: Every day | ORAL | Status: DC
  Administered 2022-04-10 – 2022-04-12 (×3): 100 mg via ORAL
  Filled 2022-04-10 (×3): qty 1

## 2022-04-10 MED ORDER — ONDANSETRON HCL 4 MG/2ML IJ SOLN
INTRAMUSCULAR | Status: AC
  Filled 2022-04-10: qty 2

## 2022-04-10 MED ORDER — ONDANSETRON HCL 4 MG/2ML IJ SOLN
INTRAMUSCULAR | Status: DC | PRN
  Administered 2022-04-10: 4 mg via INTRAVENOUS

## 2022-04-10 MED ORDER — LIDOCAINE 2% (20 MG/ML) 5 ML SYRINGE
INTRAMUSCULAR | Status: DC | PRN
  Administered 2022-04-10: 80 mg via INTRAVENOUS

## 2022-04-10 MED ORDER — PROPOFOL 10 MG/ML IV BOLUS
INTRAVENOUS | Status: AC
  Filled 2022-04-10: qty 20

## 2022-04-10 MED ORDER — FENTANYL CITRATE (PF) 100 MCG/2ML IJ SOLN: 25.0000 ug | INTRAMUSCULAR | Status: DC | PRN

## 2022-04-10 MED ORDER — ROCURONIUM BROMIDE 10 MG/ML (PF) SYRINGE
PREFILLED_SYRINGE | INTRAVENOUS | Status: AC
  Filled 2022-04-10: qty 10

## 2022-04-10 MED ORDER — DEXAMETHASONE SODIUM PHOSPHATE 10 MG/ML IJ SOLN
INTRAMUSCULAR | Status: AC
  Filled 2022-04-10: qty 1

## 2022-04-10 MED ORDER — MAGNESIUM SULFATE 2 GM/50ML IV SOLN
2.0000 g | Freq: Once | INTRAVENOUS | Status: AC
  Administered 2022-04-10: 2 g via INTRAVENOUS
  Filled 2022-04-10: qty 50

## 2022-04-10 MED ORDER — POTASSIUM CHLORIDE CRYS ER 20 MEQ PO TBCR
60.0000 meq | EXTENDED_RELEASE_TABLET | Freq: Once | ORAL | Status: AC
  Administered 2022-04-10: 60 meq via ORAL
  Filled 2022-04-10: qty 3

## 2022-04-10 MED ORDER — GUAIFENESIN ER 600 MG PO TB12: 600.0000 mg | ORAL_TABLET | Freq: Two times a day (BID) | ORAL | Status: DC | PRN

## 2022-04-10 MED ORDER — 0.9 % SODIUM CHLORIDE (POUR BTL) OPTIME
TOPICAL | Status: DC | PRN
  Administered 2022-04-10: 1000 mL

## 2022-04-10 MED ORDER — FENTANYL CITRATE (PF) 250 MCG/5ML IJ SOLN
INTRAMUSCULAR | Status: AC
  Filled 2022-04-10: qty 5

## 2022-04-10 SURGICAL SUPPLY — 42 items
ADH SKN CLS APL DERMABOND .7 (GAUZE/BANDAGES/DRESSINGS) ×1
APL PRP STRL LF DISP 70% ISPRP (MISCELLANEOUS) ×1
BAG COUNTER SPONGE SURGICOUNT (BAG) ×1 IMPLANT
BAG SPNG CNTER NS LX DISP (BAG) ×1
BLADE CLIPPER SURG (BLADE) IMPLANT
CANISTER SUCT 3000ML PPV (MISCELLANEOUS) IMPLANT
CHLORAPREP W/TINT 26 (MISCELLANEOUS) ×1 IMPLANT
COVER SURGICAL LIGHT HANDLE (MISCELLANEOUS) ×1 IMPLANT
DERMABOND ADVANCED .7 DNX12 (GAUZE/BANDAGES/DRESSINGS) ×1 IMPLANT
DRAIN PENROSE 1/2X12 LTX STRL (WOUND CARE) IMPLANT
DRAPE LAPAROTOMY TRNSV 102X78 (DRAPES) ×1 IMPLANT
ELECT CAUTERY BLADE 6.4 (BLADE) ×1 IMPLANT
ELECT REM PT RETURN 9FT ADLT (ELECTROSURGICAL) ×1
ELECTRODE REM PT RTRN 9FT ADLT (ELECTROSURGICAL) ×1 IMPLANT
GAUZE 4X4 16PLY ~~LOC~~+RFID DBL (SPONGE) ×1 IMPLANT
GLOVE BIO SURGEON STRL SZ7 (GLOVE) ×1 IMPLANT
GLOVE BIOGEL PI IND STRL 7.5 (GLOVE) ×1 IMPLANT
GOWN STRL REUS W/ TWL LRG LVL3 (GOWN DISPOSABLE) ×2 IMPLANT
GOWN STRL REUS W/TWL LRG LVL3 (GOWN DISPOSABLE) ×2
KIT BASIN OR (CUSTOM PROCEDURE TRAY) ×1 IMPLANT
KIT TURNOVER KIT B (KITS) ×1 IMPLANT
MARKER SKIN DUAL TIP RULER LAB (MISCELLANEOUS) ×1 IMPLANT
MESH ULTRAPRO 3X6 7.6X15CM (Mesh General) IMPLANT
NDL HYPO 25GX1X1/2 BEV (NEEDLE) ×1 IMPLANT
NEEDLE HYPO 25GX1X1/2 BEV (NEEDLE) ×1 IMPLANT
NS IRRIG 1000ML POUR BTL (IV SOLUTION) ×1 IMPLANT
PACK GENERAL/GYN (CUSTOM PROCEDURE TRAY) ×1 IMPLANT
PAD ARMBOARD 7.5X6 YLW CONV (MISCELLANEOUS) ×1 IMPLANT
PENCIL SMOKE EVACUATOR (MISCELLANEOUS) ×1 IMPLANT
SPIKE FLUID TRANSFER (MISCELLANEOUS) IMPLANT
STRIP CLOSURE SKIN 1/2X4 (GAUZE/BANDAGES/DRESSINGS) IMPLANT
SUPPORTER ATHLETIC SM (MISCELLANEOUS) IMPLANT
SUT MNCRL AB 4-0 PS2 18 (SUTURE) ×1 IMPLANT
SUT VIC AB 2-0 CT1 27 (SUTURE) ×1
SUT VIC AB 2-0 CT1 TAPERPNT 27 (SUTURE) ×1 IMPLANT
SUT VIC AB 2-0 SH 18 (SUTURE) ×1 IMPLANT
SUT VIC AB 3-0 SH 27 (SUTURE) ×1
SUT VIC AB 3-0 SH 27XBRD (SUTURE) ×1 IMPLANT
SUT VICRYL AB 2 0 TIES (SUTURE) ×1 IMPLANT
SYR CONTROL 10ML LL (SYRINGE) ×1 IMPLANT
TOWEL GREEN STERILE (TOWEL DISPOSABLE) ×1 IMPLANT
TOWEL GREEN STERILE FF (TOWEL DISPOSABLE) ×1 IMPLANT

## 2022-04-10 NOTE — Progress Notes (Signed)
Subjective/Chief Complaint: Unchanged, cards eval done   Objective: Vital signs in last 24 hours: Temp:  [97.7 F (36.5 C)-98.5 F (36.9 C)] 98.2 F (36.8 C) (01/12 0841) Pulse Rate:  [63-82] 64 (01/12 0841) Resp:  [16-20] 18 (01/12 0841) BP: (101-152)/(67-89) 104/76 (01/12 0841) SpO2:  [93 %-99 %] 96 % (01/12 0841) Weight:  [83 kg] 83 kg (01/12 0500) Last BM Date : 04/08/22  Intake/Output from previous day: 01/11 0701 - 01/12 0700 In: 680 [P.O.:680] Out: 1400 [Urine:1400] Intake/Output this shift: No intake/output data recorded.  Ab soft nontender, hernia now soft, incarcerated  Lab Results:  Recent Labs    04/08/22 1857 04/10/22 0625  WBC 5.0 3.5*  HGB 12.9* 11.7*  HCT 39.9 36.0*  PLT 185 154   BMET Recent Labs    04/08/22 1857 04/10/22 0625  NA 136 136  K 3.9 3.4*  CL 99 100  CO2 27 25  GLUCOSE 92 102*  BUN 9 8  CREATININE 0.75 0.82  CALCIUM 9.4 8.7*   PT/INR No results for input(s): "LABPROT", "INR" in the last 72 hours. ABG No results for input(s): "PHART", "HCO3" in the last 72 hours.  Invalid input(s): "PCO2", "PO2"  Studies/Results: CT ABDOMEN PELVIS W CONTRAST  Result Date: 04/08/2022 CLINICAL DATA:  Right lower quadrant abdominal pain radiating to the right groin. History of inguinal hernia. EXAM: CT ABDOMEN AND PELVIS WITH CONTRAST TECHNIQUE: Multidetector CT imaging of the abdomen and pelvis was performed using the standard protocol following bolus administration of intravenous contrast. RADIATION DOSE REDUCTION: This exam was performed according to the departmental dose-optimization program which includes automated exposure control, adjustment of the mA and/or kV according to patient size and/or use of iterative reconstruction technique. CONTRAST:  29m OMNIPAQUE IOHEXOL 350 MG/ML SOLN COMPARISON:  03/08/2022 FINDINGS: Lower chest: Small hiatal hernia.  No acute abnormality Hepatobiliary: Pneumobilia. Unremarkable gallbladder. No  suspicious focal hepatic lesion. Pancreas: Unremarkable. Spleen: Unremarkable. Adrenals/Urinary Tract: Unchanged adrenal glands. No obstructing urinary calculi or hydronephrosis. The bladder is not well distended. Stomach/Bowel: Herniation of the sigmoid and distal descending colon into the large left inguinal hernia. There is sigmoid diverticula with wall thickening of the sigmoid colon within the hernia sac. Additional herniation of a loop of small bowel into the left inguinal hernia sac. There is fecalization of small bowel contents within the hernia sac and borderline dilation of the herniated loop of small bowel. Normal appendix. Redemonstrated rectal wall thickening though this is decreased from prior. Vascular/Lymphatic: Aortic atherosclerotic calcification. Focal dilation of the infrarenal abdominal aorta measuring 34 mm in diameter. No suspicious lymphadenopathy. Reproductive: Unremarkable. Other: Large left and small right inguinal hernias. No free intraperitoneal air. Musculoskeletal: No acute fracture. Right THA. Which versus preference IMPRESSION: 1. Large left inguinal hernia containing sigmoid colon and a loop of small bowel 2. The herniated loop of small bowel is mildly distended and contains feculent material compatible with partial obstruction. 3. The herniated loop of sigmoid colon contains multiple diverticula with mild wall thickening suggestive of diverticulitis/colitis. 4. Redemonstrated proctitis though this is improved from 03/08/2022. 5. Small hiatal hernia. 6. Infrarenal abdominal aorta measuring 34 mm in maximum diameter. Recommend follow-up ultrasound every 3 years. This recommendation follows ACR consensus guidelines: White Paper of the ACR Incidental Findings Committee II on Vascular Findings. J Am Coll Radiol 2013; 10:789-794. 7. Aortic Atherosclerosis (ICD10-I70.0). Electronically Signed   By: TPlacido SouM.D.   On: 04/08/2022 22:37    Anti-infectives: Anti-infectives (From  admission, onward)  None       Assessment/Plan: Enlarging, chronically incarcerated LIH, possible pSBO -appreciate cards eval -plan for open [facility] repair today -We discussed observation versus repair.  We discussed both laparoscopic and open inguinal hernia repairs. I described the procedure in detail.  The patient was given educational material.  Goals should be achieved with surgery. We discussed the usage of mesh and the rationale behind that. We went over the pathophysiology of inguinal hernia. We have elected to perform open inguinal hernia repair with mesh.  We discussed the risks including bleeding, infection, recurrence, postoperative pain and chronic groin pain, testicular injury, urinary retention, numbness in groin and around incision.     FEN - NPO, IVF VTE - SCD's  ID - periop    CAD  PMH Vtach s/p ICD COPD Afib on eliquis - hold eliquis  HLD PMH symptomatic cholelithiasis Hiatal hernia    Rolm Bookbinder 04/10/2022

## 2022-04-10 NOTE — Progress Notes (Addendum)
Cardiologist:  Taylor/Croitoru  Subjective:  Denies SSCP, palpitations or Dyspnea   Objective:  Vitals:   04/09/22 2004 04/09/22 2209 04/10/22 0500 04/10/22 0519  BP: 138/85   101/67  Pulse: 71 73  63  Resp:  16    Temp: 98 F (36.7 C)   97.7 F (36.5 C)  TempSrc: Oral   Oral  SpO2: 95% 95%  93%  Weight:   83 kg   Height:        Intake/Output from previous day:  Intake/Output Summary (Last 24 hours) at 04/10/2022 0809 Last data filed at 04/09/2022 2350 Gross per 24 hour  Intake 680 ml  Output 1400 ml  Net -720 ml    Physical Exam: Elderly male Lungs clear AICD under left clavicle  PMI enlarged  Left inguinal hernia large  No edema Palpable pedal pulses   Lab Results: Basic Metabolic Panel: Recent Labs    04/08/22 1857  NA 136  K 3.9  CL 99  CO2 27  GLUCOSE 92  BUN 9  CREATININE 0.75  CALCIUM 9.4   Liver Function Tests: Recent Labs    04/08/22 1857  AST 25  ALT 23  ALKPHOS 63  BILITOT 0.8  PROT 7.0  ALBUMIN 3.6   Recent Labs    04/08/22 1857  LIPASE 37   CBC: Recent Labs    04/08/22 1857 04/10/22 0625  WBC 5.0 3.5*  NEUTROABS 3.4  --   HGB 12.9* 11.7*  HCT 39.9 36.0*  MCV 105.0* 104.3*  PLT 185 154    Imaging: CT ABDOMEN PELVIS W CONTRAST  Result Date: 04/08/2022 CLINICAL DATA:  Right lower quadrant abdominal pain radiating to the right groin. History of inguinal hernia. EXAM: CT ABDOMEN AND PELVIS WITH CONTRAST TECHNIQUE: Multidetector CT imaging of the abdomen and pelvis was performed using the standard protocol following bolus administration of intravenous contrast. RADIATION DOSE REDUCTION: This exam was performed according to the departmental dose-optimization program which includes automated exposure control, adjustment of the mA and/or kV according to patient size and/or use of iterative reconstruction technique. CONTRAST:  12m OMNIPAQUE IOHEXOL 350 MG/ML SOLN COMPARISON:  03/08/2022 FINDINGS: Lower chest: Small hiatal  hernia.  No acute abnormality Hepatobiliary: Pneumobilia. Unremarkable gallbladder. No suspicious focal hepatic lesion. Pancreas: Unremarkable. Spleen: Unremarkable. Adrenals/Urinary Tract: Unchanged adrenal glands. No obstructing urinary calculi or hydronephrosis. The bladder is not well distended. Stomach/Bowel: Herniation of the sigmoid and distal descending colon into the large left inguinal hernia. There is sigmoid diverticula with wall thickening of the sigmoid colon within the hernia sac. Additional herniation of a loop of small bowel into the left inguinal hernia sac. There is fecalization of small bowel contents within the hernia sac and borderline dilation of the herniated loop of small bowel. Normal appendix. Redemonstrated rectal wall thickening though this is decreased from prior. Vascular/Lymphatic: Aortic atherosclerotic calcification. Focal dilation of the infrarenal abdominal aorta measuring 34 mm in diameter. No suspicious lymphadenopathy. Reproductive: Unremarkable. Other: Large left and small right inguinal hernias. No free intraperitoneal air. Musculoskeletal: No acute fracture. Right THA. Which versus preference IMPRESSION: 1. Large left inguinal hernia containing sigmoid colon and a loop of small bowel 2. The herniated loop of small bowel is mildly distended and contains feculent material compatible with partial obstruction. 3. The herniated loop of sigmoid colon contains multiple diverticula with mild wall thickening suggestive of diverticulitis/colitis. 4. Redemonstrated proctitis though this is improved from 03/08/2022. 5. Small hiatal hernia. 6. Infrarenal abdominal aorta measuring 34 mm in maximum  diameter. Recommend follow-up ultrasound every 3 years. This recommendation follows ACR consensus guidelines: White Paper of the ACR Incidental Findings Committee II on Vascular Findings. J Am Coll Radiol 2013; 10:789-794. 7. Aortic Atherosclerosis (ICD10-I70.0). Electronically Signed   By:  Placido Sou M.D.   On: 04/08/2022 22:37    Cardiac Studies:  ECG: Sinus IVCD old IMI   Telemetry: NSR  Echo: 03/19/22 EF 25-30%   Medications:    acetaminophen  1,000 mg Oral On Call to OR   amiodarone  200 mg Oral Daily   arformoterol  15 mcg Nebulization BID   And   umeclidinium bromide  1 puff Inhalation Daily   busPIRone  15 mg Oral BID   carvedilol  3.125 mg Oral BID   empagliflozin  10 mg Oral Daily   escitalopram  10 mg Oral Daily   fenofibrate  54 mg Oral Daily   fluticasone  2 spray Each Nare Daily   gabapentin  300 mg Oral BID   loratadine  10 mg Oral Daily   magnesium oxide  400 mg Oral Daily   mexiletine  200 mg Oral BID   pantoprazole  40 mg Oral Daily   rosuvastatin  40 mg Oral QHS   sacubitril-valsartan  1 tablet Oral BID   tamsulosin  0.4 mg Oral QPC supper      heparin Stopped (04/10/22 0403)    Assessment/Plan:   CAD:  Recent cath 03/19/22 wth patent stents to mid RCA, proximal LAD occluded small OM3 new and 80% small PDA dx Medical Rx recommended No angina stable continue coreg and statin should be on 81 mg ASA Ischemic DCM:  euvolemic recent additon of entresto I/O negative use lasix as needed to keep I/O's equal  AICD:  ERI full RX through February Due to be replaced latter this month with Dr Lovena Le Has had ATP interventions but no shocks Continue mexiletine and amiodarone for arrhythmia prevention Keep K > 4 and Mg > 2  No need to turn off or use magnet with inguinal surgery  PAF:  eliquis held surgery to decide on when its ok to resume heparin post op and d/c on DOAC again    Jenkins Rouge 04/10/2022, 8:09 AM

## 2022-04-10 NOTE — Progress Notes (Signed)
Patient  has hernia repair surgery,patient on heparin drip order to hold at least 6 hours before surgery ,but no surgery schedule  time, on call informed and information given to hold at 4 AM.Will continue to monitor.

## 2022-04-10 NOTE — Anesthesia Procedure Notes (Signed)
Procedure Name: Intubation Date/Time: 04/10/2022 12:09 PM  Performed by: Leonor Liv, CRNAPre-anesthesia Checklist: Patient identified, Emergency Drugs available, Suction available and Patient being monitored Patient Re-evaluated:Patient Re-evaluated prior to induction Oxygen Delivery Method: Circle System Utilized Preoxygenation: Pre-oxygenation with 100% oxygen Induction Type: IV induction Ventilation: Mask ventilation without difficulty Laryngoscope Size: Mac and 4 Grade View: Grade I Tube type: Oral Tube size: 7.5 mm Number of attempts: 1 Airway Equipment and Method: Stylet and Oral airway Placement Confirmation: ETT inserted through vocal cords under direct vision, positive ETCO2 and breath sounds checked- equal and bilateral Secured at: 23 cm Tube secured with: Tape Dental Injury: Teeth and Oropharynx as per pre-operative assessment

## 2022-04-10 NOTE — Progress Notes (Signed)
PROGRESS NOTE    Steven Guzman  WUJ:[id number] DOB: Sep 19, 1940 DOA: 04/08/2022 PCP: Yvonna Alanis, NP   Brief Narrative:  82 y.o. male with medical history significant of hypertension, hyperlipidemia, CAD s/p inferior MI/cardiac arrest 1995 and PCI of LAD/RCA in 2011, recent hospitalization in 12/2 and 23 with non-STEMI requiring cardiac catheterization with recommendations of medical management, heart failure with reduced EF, ischemic cardiomyopathy  s/p AICD, VT  s/p ablation 2016, PAF on Eliquis, COPD, CKD stage III, and AAA presented with worsening abdominal pain and groin pain.  On presentation, CT of the abdomen and pelvis showed a large left inguinal hernia containing sigmoid colon and a loop of small bowel with the herniated loop of small bowel mildly distended and containing feculent material compatible with partial obstruction.  General surgery and cardiology were consulted.  Assessment & Plan:   Incarcerated left inguinal hernia Partial small bowel obstruction -General surgery following and planning for surgical intervention today.  NPO.  Paroxysmal A-fib on chronic anticoagulation Status post AICD; history of DVT -Eliquis on hold.  On heparin drip as per pharmacy: Hold prior to surgery today.  Continue amiodarone, Coreg and mexiletine.  Cardiology following.  Keep potassium and magnesium above 4 and 2 respectively  Chronic systolic heart failure CAD Hyperlipidemia Essential hypertension -Compensated.  Echo in 12/23 had shown EF of 25 to 30% with grade 1 diastolic dysfunction.  Strict input and output.  Daily weights.  Fluid restriction. -Continue Coreg and Entresto.  Cardiology following. -Most recent cardiac catheterization had recommended medical management.  Continue statin.  Currently chest pain-free  COPD, without acute exacerbation -No significant wheezing appreciated on physical exam at this time.  O2 saturations currently maintained on room air. -Continue home  inhalers and breathing treatments as needed   AAA -Patient has a 4.5 cm thoracic aortic aneurysm and a 3.4 cm infrarenal abdominal aortic aneurysm -Continue outpatient surveillance and follow-up  Macrocytic anemia -Questionable cause.  Hemoglobin stable.  Monitor intermittently  Hypokalemia -Replace.  Repeat a.m. labs.  Replace magnesium as well to keep magnesium above 2   DVT prophylaxis: Heparin drip held this morning for surgery Code Status: Full Family Communication: Wife at bedside Disposition Plan: Status is: Inpatient Remains inpatient appropriate because: Of surgical intervention need    Consultants: General surgery/cardiology  Procedures: None  Antimicrobials: None   Subjective: Patient seen and examined at bedside.  Denies worsening abdominal pain, chest pain or shortness of breath or vomiting.  Objective: Vitals:   04/10/22 0500 04/10/22 0519 04/10/22 0819 04/10/22 0841  BP:  101/67  104/76  Pulse:  63  64  Resp:    18  Temp:  97.7 F (36.5 C)  98.2 F (36.8 C)  TempSrc:  Oral  Oral  SpO2:  93% 93% 96%  Weight: 83 kg     Height:        Intake/Output Summary (Last 24 hours) at 04/10/2022 0951 Last data filed at 04/09/2022 2350 Gross per 24 hour  Intake 680 ml  Output 1400 ml  Net -720 ml   Filed Weights   04/08/22 2029 04/10/22 0500  Weight: 86.2 kg 83 kg    Examination:  General exam: Appears calm and comfortable.  Currently on room air. Respiratory system: Bilateral decreased breath sounds at bases with some scattered crackles Cardiovascular system: S1 & S2 heard, Rate controlled Gastrointestinal system: Abdomen is nondistended, soft and nontender. Normal bowel sounds heard. Extremities: No cyanosis, clubbing; trace lower extremity edema present  Central nervous system:  Alert and oriented. No focal neurological deficits. Moving extremities Skin: No rashes, lesions or ulcers Psychiatry: Judgement and insight appear normal. Mood & affect  appropriate.     Data Reviewed: I have personally reviewed following labs and imaging studies  CBC: Recent Labs  Lab 04/06/22 1608 04/08/22 1857 04/10/22 0625  WBC 4.0 5.0 3.5*  NEUTROABS 2.5 3.4  --   HGB 12.3* 12.9* 11.7*  HCT 36.9* 39.9 36.0*  MCV 101* 105.0* 104.3*  PLT 143* 185 253   Basic Metabolic Panel: Recent Labs  Lab 04/06/22 1608 04/08/22 1857 04/10/22 0625  NA 139 136 136  K 4.2 3.9 3.4*  CL 103 99 100  CO2 '23 27 25  '$ GLUCOSE 129* 92 102*  BUN 6* 9 8  CREATININE 0.72* 0.75 0.82  CALCIUM 9.1 9.4 8.7*  MG  --   --  1.9   GFR: Estimated Creatinine Clearance: 74.9 mL/min (by C-G formula based on SCr of 0.82 mg/dL). Liver Function Tests: Recent Labs  Lab 04/08/22 1857  AST 25  ALT 23  ALKPHOS 63  BILITOT 0.8  PROT 7.0  ALBUMIN 3.6   Recent Labs  Lab 04/08/22 1857  LIPASE 37   No results for input(s): "AMMONIA" in the last 168 hours. Coagulation Profile: No results for input(s): "INR", "PROTIME" in the last 168 hours. Cardiac Enzymes: No results for input(s): "CKTOTAL", "CKMB", "CKMBINDEX", "TROPONINI" in the last 168 hours. BNP (last 3 results) No results for input(s): "PROBNP" in the last 8760 hours. HbA1C: No results for input(s): "HGBA1C" in the last 72 hours. CBG: No results for input(s): "GLUCAP" in the last 168 hours. Lipid Profile: No results for input(s): "CHOL", "HDL", "LDLCALC", "TRIG", "CHOLHDL", "LDLDIRECT" in the last 72 hours. Thyroid Function Tests: No results for input(s): "TSH", "T4TOTAL", "FREET4", "T3FREE", "THYROIDAB" in the last 72 hours. Anemia Panel: No results for input(s): "VITAMINB12", "FOLATE", "FERRITIN", "TIBC", "IRON", "RETICCTPCT" in the last 72 hours. Sepsis Labs: No results for input(s): "PROCALCITON", "LATICACIDVEN" in the last 168 hours.  Recent Results (from the past 240 hour(s))  Surgical pcr screen     Status: None   Collection Time: 04/10/22  2:21 AM   Specimen: Nasal Mucosa; Nasal Swab  Result  Value Ref Range Status   MRSA, PCR NEGATIVE NEGATIVE Final   Staphylococcus aureus NEGATIVE NEGATIVE Final    Comment: (NOTE) The Xpert SA Assay (FDA approved for NASAL specimens in patients 13 years of age and older), is one component of a comprehensive surveillance program. It is not intended to diagnose infection nor to guide or monitor treatment. Performed at [facility], [place] [address], [place], [place] [address]          Radiology Studies: CT ABDOMEN PELVIS W CONTRAST  Result Date: 04/08/2022 CLINICAL DATA:  Right lower quadrant abdominal pain radiating to the right groin. History of inguinal hernia. EXAM: CT ABDOMEN AND PELVIS WITH CONTRAST TECHNIQUE: Multidetector CT imaging of the abdomen and pelvis was performed using the standard protocol following bolus administration of intravenous contrast. RADIATION DOSE REDUCTION: This exam was performed according to the departmental dose-optimization program which includes automated exposure control, adjustment of the mA and/or kV according to patient size and/or use of iterative reconstruction technique. CONTRAST:  66m OMNIPAQUE IOHEXOL 350 MG/ML SOLN COMPARISON:  03/08/2022 FINDINGS: Lower chest: Small hiatal hernia.  No acute abnormality Hepatobiliary: Pneumobilia. Unremarkable gallbladder. No suspicious focal hepatic lesion. Pancreas: Unremarkable. Spleen: Unremarkable. Adrenals/Urinary Tract: Unchanged adrenal glands. No obstructing urinary calculi or hydronephrosis. The bladder  is not well distended. Stomach/Bowel: Herniation of the sigmoid and distal descending colon into the large left inguinal hernia. There is sigmoid diverticula with wall thickening of the sigmoid colon within the hernia sac. Additional herniation of a loop of small bowel into the left inguinal hernia sac. There is fecalization of small bowel contents within the hernia sac and borderline dilation of the herniated loop of small bowel. Normal appendix.  Redemonstrated rectal wall thickening though this is decreased from prior. Vascular/Lymphatic: Aortic atherosclerotic calcification. Focal dilation of the infrarenal abdominal aorta measuring 34 mm in diameter. No suspicious lymphadenopathy. Reproductive: Unremarkable. Other: Large left and small right inguinal hernias. No free intraperitoneal air. Musculoskeletal: No acute fracture. Right THA. Which versus preference IMPRESSION: 1. Large left inguinal hernia containing sigmoid colon and a loop of small bowel 2. The herniated loop of small bowel is mildly distended and contains feculent material compatible with partial obstruction. 3. The herniated loop of sigmoid colon contains multiple diverticula with mild wall thickening suggestive of diverticulitis/colitis. 4. Redemonstrated proctitis though this is improved from 03/08/2022. 5. Small hiatal hernia. 6. Infrarenal abdominal aorta measuring 34 mm in maximum diameter. Recommend follow-up ultrasound every 3 years. This recommendation follows ACR consensus guidelines: White Paper of the ACR Incidental Findings Committee II on Vascular Findings. J Am Coll Radiol 2013; 10:789-794. 7. Aortic Atherosclerosis (ICD10-I70.0). Electronically Signed   By: Placido Sou M.D.   On: 04/08/2022 22:37        Scheduled Meds:  acetaminophen  1,000 mg Oral On Call to OR   amiodarone  200 mg Oral Daily   arformoterol  15 mcg Nebulization BID   And   umeclidinium bromide  1 puff Inhalation Daily   busPIRone  15 mg Oral BID   carvedilol  3.125 mg Oral BID   escitalopram  10 mg Oral Daily   fenofibrate  54 mg Oral Daily   fluticasone  2 spray Each Nare Daily   gabapentin  300 mg Oral BID   loratadine  10 mg Oral Daily   magnesium oxide  400 mg Oral Daily   mexiletine  200 mg Oral BID   pantoprazole  40 mg Oral Daily   rosuvastatin  40 mg Oral QHS   sacubitril-valsartan  1 tablet Oral BID   tamsulosin  0.4 mg Oral QPC supper   Continuous Infusions:  magnesium  sulfate bolus IVPB 2 g (04/10/22 0939)          Aline August, MD [facility] 04/10/2022, 9:51 AM

## 2022-04-10 NOTE — Anesthesia Preprocedure Evaluation (Addendum)
Anesthesia Evaluation  Patient identified by MRN, date of birth, ID band Patient awake    Reviewed: Allergy & Precautions, NPO status , Patient's Chart, lab work & pertinent test results  Airway Mallampati: II  TM Distance: >3 FB Neck ROM: Full    Dental  (+) Missing   Pulmonary COPD,  COPD inhaler, former smoker   Pulmonary exam normal        Cardiovascular hypertension, Pt. on home beta blockers + angina  + CAD, + Past MI, + Cardiac Stents and +CHF  Normal cardiovascular exam+ pacemaker + Cardiac Defibrillator [facility])   ECHO: Left ventricular ejection fraction, by estimation, is 25 to 30%   Neuro/Psych  PSYCHIATRIC DISORDERS Anxiety Depression    negative neurological ROS     GI/Hepatic Neg liver ROS, hiatal hernia,GERD  Medicated and Controlled,,  Endo/Other  negative endocrine ROS    Renal/GU Renal disease     Musculoskeletal   Abdominal   Peds  Hematology  (+) Blood dyscrasia (Eliquis), anemia   Anesthesia Other Findings Incarcerated Hernia  Reproductive/Obstetrics                             Anesthesia Physical Anesthesia Plan  ASA: 4  Anesthesia Plan: General   Post-op Pain Management:    Induction: Intravenous  PONV Risk Score and Plan: 2 and Ondansetron, Dexamethasone and Treatment may vary due to age or medical condition  Airway Management Planned: Oral ETT  Additional Equipment: ClearSight  Intra-op Plan:   Post-operative Plan: Extubation in OR  Informed Consent: I have reviewed the patients History and Physical, chart, labs and discussed the procedure including the risks, benefits and alternatives for the proposed anesthesia with the patient or authorized representative who has indicated his/her understanding and acceptance.     Dental advisory given  Plan Discussed with: CRNA  Anesthesia Plan Comments: (Potential arterial line placement  discussed)        Anesthesia Quick Evaluation

## 2022-04-10 NOTE — Plan of Care (Signed)
  Problem: Education: Goal: Understanding of cardiac disease, CV risk reduction, and recovery process will improve Outcome: Progressing

## 2022-04-10 NOTE — Transfer of Care (Signed)
Immediate Anesthesia Transfer of Care Note  Patient: Steven Guzman  Procedure(s) Performed: HERNIA REPAIR INGUINAL ADULT (Left: Inguinal)  Patient Location: PACU  Anesthesia Type:General  Level of Consciousness: drowsy and patient cooperative  Airway & Oxygen Therapy: Patient Spontanous Breathing and Patient connected to face mask oxygen  Post-op Assessment: Report given to RN and Post -op Vital signs reviewed and stable  Post vital signs: Reviewed and stable  Last Vitals:  Vitals Value Taken Time  BP 115/71 04/10/22 1345  Temp    Pulse 66 04/10/22 1346  Resp 19 04/10/22 1346  SpO2 97 % 04/10/22 1346  Vitals shown include unvalidated device data.  Last Pain:  Vitals:   04/10/22 1019  TempSrc: Oral  PainSc:          Complications: No notable events documented.

## 2022-04-10 NOTE — Op Note (Signed)
Preoperative diagnosis: Chronically incarcerated left inguinal hernia Postoperative diagnosis: Same as above Procedure: Open left inguinal hernia repair with ultra Pro mesh patch Surgeon: Dr. Serita Grammes Anesthesia: General Estimated blood loss: 50 cc Complications: None Drains: None Specimens: None Sponge count correct x 2 at end of operation Disposition recovery stable condition  Indications: This is an 82 year old male with a chronically incarcerated left groin hernia.  He had some increased pain as well as decreased bowel function.  He was found on CT scan to have a chronically incarcerated colon but also some small bowel incarcerated higher and he is currently.  I was able to reduce the obstructed portion but after cardiology evaluation we discussed open repair of this hernia.  Procedure: After informed consent was obtained he was taken to the operating room.  SCDs were placed.  Antibiotics were given.  He was placed under general anesthesia without complication.  He was prepped and draped in sterile sterile surgical fashion.  Surgical timeout was then performed.  I then infiltrated Marcaine and did an ilioinguinal nerve block.  I made a lengthy left groin incision overlying the hernia.  He had his scrotum full of his bowel.  I then dissected down through the external oblique.  I then opened the external oblique through the external ring.  I then had to enter into the sac and was able to finally reduce his entire sigmoid colon which was present in the scrotum.  This was adherent to his testicle.  I identified the spermatic cord and released this from the hernia I then was able to place the testicle back in the scrotum.  With some difficulty I then reduced the entire hernia.  He really had no floor and this was a very large pantaloon hernia.  I then sewed the hernia sac shut.  I closed his internal oblique to his inguinal ligament with 2-0 Vicryl.  I then placed a large sheet of ultra Pro mesh  over the entire area.  I sutured this into position with 2-0 Vicryl to the pubic tubercle 4 times.  I then sutured it to the inguinal ligament inferiorly every half centimeter.  I made a cut and it wrapped around the spermatic cord.  I then sutured the cut ends together with 2-0 Vicryl as well as to the internal oblique.  I sutured this medially as well to the internal oblique.  I then laid the lateral portion flat under the external oblique.  This extended a good 6 cm past the internal ring.  Under Valsalva this all looked to be intact.  The mesh was in good position.  I then obtained hemostasis.  I then closed the external oblique with 2-0 Vicryl.  The Scarpa's fascia was closed with 3-0 Vicryl.  The skin was closed with 4-0 Monocryl.  Glue was placed.  He tolerated this well was extubated transferred to recovery stable.

## 2022-04-11 DIAGNOSIS — Z95810 Presence of automatic (implantable) cardiac defibrillator: Secondary | ICD-10-CM | POA: Diagnosis not present

## 2022-04-11 DIAGNOSIS — K4030 Unilateral inguinal hernia, with obstruction, without gangrene, not specified as recurrent: Secondary | ICD-10-CM | POA: Diagnosis not present

## 2022-04-11 DIAGNOSIS — I480 Paroxysmal atrial fibrillation: Secondary | ICD-10-CM | POA: Diagnosis not present

## 2022-04-11 DIAGNOSIS — I5022 Chronic systolic (congestive) heart failure: Secondary | ICD-10-CM | POA: Diagnosis not present

## 2022-04-11 DIAGNOSIS — I255 Ischemic cardiomyopathy: Secondary | ICD-10-CM | POA: Diagnosis not present

## 2022-04-11 DIAGNOSIS — I10 Essential (primary) hypertension: Secondary | ICD-10-CM | POA: Diagnosis not present

## 2022-04-11 LAB — BASIC METABOLIC PANEL
Anion gap: 6 (ref 5–15)
BUN: 9 mg/dL (ref 8–23)
CO2: 25 mmol/L (ref 22–32)
Calcium: 8.7 mg/dL — ABNORMAL LOW (ref 8.9–10.3)
Chloride: 102 mmol/L (ref 98–111)
Creatinine, Ser: 0.7 mg/dL (ref 0.61–1.24)
GFR, Estimated: >60 mL/min (ref >60)
Glucose, Bld: 112 mg/dL — ABNORMAL HIGH (ref 70–99)
Potassium: 4.2 mmol/L (ref 3.5–5.1)
Sodium: 133 mmol/L — ABNORMAL LOW (ref 135–145)

## 2022-04-11 LAB — CBC
HCT: 34.1 % — ABNORMAL LOW (ref 39.0–52.0)
Hemoglobin: 11.3 g/dL — ABNORMAL LOW (ref 13.0–17.0)
MCH: 34.2 pg — ABNORMAL HIGH (ref 26.0–34.0)
MCHC: 33.1 g/dL (ref 30.0–36.0)
MCV: 103.3 fL — ABNORMAL HIGH (ref 80.0–100.0)
Platelets: 148 10*3/uL — ABNORMAL LOW (ref 150–400)
RBC: 3.3 MIL/uL — ABNORMAL LOW (ref 4.22–5.81)
RDW: 17.1 % — ABNORMAL HIGH (ref 11.5–15.5)
WBC: 7 10*3/uL (ref 4.0–10.5)
nRBC: 0 % (ref 0.0–0.2)

## 2022-04-11 LAB — MAGNESIUM: Magnesium: 2.1 mg/dL (ref 1.7–2.4)

## 2022-04-11 NOTE — Progress Notes (Signed)
1 Day Post-Op   Subjective/Chief Complaint: Comfortable this morning Pain controlled   Objective: Vital signs in last 24 hours: Temp:  [97.3 F (36.3 C)-98.3 F (36.8 C)] 97.8 F (36.6 C) (01/13 0429) Pulse Rate:  [62-74] 67 (01/13 0429) Resp:  [16-20] 18 (01/13 0429) BP: (93-118)/(63-76) 112/67 (01/13 0429) SpO2:  [92 %-98 %] 93 % (01/13 0429) Weight:  [83.5 kg-84.4 kg] 84.4 kg (01/13 0500) Last BM Date : 04/08/22  Intake/Output from previous day: 01/12 0701 - 01/13 0700 In: 360 [P.O.:360] Out: 750 [Urine:700; Blood:50] Intake/Output this shift: No intake/output data recorded.  Exam: Awake and alert Looks comfortable Abdomen soft, right inguinal incision clean with ecchymosis  Lab Results:  Recent Labs    04/10/22 0625 04/11/22 0242  WBC 3.5* 7.0  HGB 11.7* 11.3*  HCT 36.0* 34.1*  PLT 154 148*   BMET Recent Labs    04/10/22 0625 04/11/22 0242  NA 136 133*  K 3.4* 4.2  CL 100 102  CO2 25 25  GLUCOSE 102* 112*  BUN 8 9  CREATININE 0.82 0.70  CALCIUM 8.7* 8.7*   PT/INR No results for input(s): "LABPROT", "INR" in the last 72 hours. ABG No results for input(s): "PHART", "HCO3" in the last 72 hours.  Invalid input(s): "PCO2", "PO2"  Studies/Results: No results found.  Anti-infectives: Anti-infectives (From admission, onward)    None       Assessment/Plan: POD#1 S/P REPAIR INCARCERATED LEFT INGUINAL HERNIA WITH MESH, MW 1/12  Doing well Ok to ambulate Pain control Suspect he will be ready for discharge tomorrow Resume Eliquis Sunday   Coralie Keens MD 04/11/2022

## 2022-04-11 NOTE — Progress Notes (Signed)
PROGRESS NOTE    Steven Guzman  NOM:[id number] DOB: 1940/12/18 DOA: 04/08/2022 PCP: Yvonna Alanis, NP   Brief Narrative:  82 y.o. male with medical history significant of hypertension, hyperlipidemia, CAD s/p inferior MI/cardiac arrest 1995 and PCI of LAD/RCA in 2011, recent hospitalization in 12/2 and 23 with non-STEMI requiring cardiac catheterization with recommendations of medical management, heart failure with reduced EF, ischemic cardiomyopathy  s/p AICD, VT  s/p ablation 2016, PAF on Eliquis, COPD, CKD stage III, and AAA presented with worsening abdominal pain and groin pain.  On presentation, CT of the abdomen and pelvis showed a large left inguinal hernia containing sigmoid colon and a loop of small bowel with the herniated loop of small bowel mildly distended and containing feculent material compatible with partial obstruction.  General surgery and cardiology were consulted.  He underwent surgical intervention on 04/10/2022  Assessment & Plan:   Incarcerated left inguinal hernia Partial small bowel obstruction -Status post open left inguinal hernia repair on 04/10/2022 by general surgery.  Follow further general surgery recommendations: Wound care as per general surgery   Paroxysmal A-fib on chronic anticoagulation Status post AICD; history of DVT -Eliquis on hold.  Heparin drip held prior to surgery.  Resume once cleared by general surgery.  Continue amiodarone, Coreg and mexiletine.  Cardiology following.  Keep potassium and magnesium above 4 and 2 respectively  Chronic systolic heart failure CAD Hyperlipidemia Essential hypertension -Compensated.  Echo in 12/23 had shown EF of 25 to 30% with grade 1 diastolic dysfunction.  Strict input and output.  Daily weights.  Fluid restriction. -Continue Coreg and Entresto.  Cardiology following. -Most recent cardiac catheterization had recommended medical management.  Continue statin.  Currently chest pain-free  COPD, without acute  exacerbation -No significant wheezing appreciated on physical exam at this time.  O2 saturations currently maintained on room air. -Continue home inhalers and breathing treatments as needed   AAA -Patient has a 4.5 cm thoracic aortic aneurysm and a 3.4 cm infrarenal abdominal aortic aneurysm -Continue outpatient surveillance and follow-up  Macrocytic anemia -Questionable cause.  Hemoglobin stable.  Monitor intermittently  Thrombocytopenia -Questionable cause.  Monitor intermittently  Leukopenia -Resolved  Hyponatremia -Mild.  Monitor  Hypokalemia -Resolved  DVT prophylaxis: Heparin drip prior to surgery  code Status: Full Family Communication: Wife at bedside Disposition Plan: Status is: Inpatient Remains inpatient appropriate because: Of surgical intervention need    Consultants: General surgery/cardiology  Procedures: None  Antimicrobials: None   Subjective: Patient seen and examined at bedside.  No fever, vomiting, chest pain reported.  Denies worsening shortness of breath.  Complains of some pain in the left groin. Objective: Vitals:   04/10/22 2150 04/10/22 2359 04/11/22 0429 04/11/22 0500  BP: 97/73 93/63 112/67   Pulse: 68 74 67   Resp:  16 18   Temp:  98 F (36.7 C) 97.8 F (36.6 C)   TempSrc:  Oral Oral   SpO2:  96% 93%   Weight:    84.4 kg  Height:        Intake/Output Summary (Last 24 hours) at 04/11/2022 0743 Last data filed at 04/11/2022 0548 Gross per 24 hour  Intake 360 ml  Output 750 ml  Net -390 ml    Filed Weights   04/10/22 0500 04/10/22 1019 04/11/22 0500  Weight: 83 kg 83.5 kg 84.4 kg    Examination:  General: On 2 L oxygen via nasal cannula.  No distress.  Looks chronically ill and deconditioned ENT/neck: No thyromegaly.  JVD is not elevated  respiratory: Decreased breath sounds at bases bilaterally with some crackles; no wheezing  CVS: S1-S2 heard, rate controlled currently Abdominal: Soft, nontender, slightly distended;  no organomegaly, bowel sounds are heard.  Right inguinal incision with ecchymosis present Extremities: Trace lower extremity edema; no cyanosis  CNS: Awake and alert.  No focal neurologic deficit.  Moves extremities Lymph: No obvious lymphadenopathy Skin: No obvious ecchymosis/lesions  psych: Affect, judgment and mood are normal  musculoskeletal: No obvious joint swelling/deformity     Data Reviewed: I have personally reviewed following labs and imaging studies  CBC: Recent Labs  Lab 04/06/22 1608 04/08/22 1857 04/10/22 0625 04/11/22 0242  WBC 4.0 5.0 3.5* 7.0  NEUTROABS 2.5 3.4  --   --   HGB 12.3* 12.9* 11.7* 11.3*  HCT 36.9* 39.9 36.0* 34.1*  MCV 101* 105.0* 104.3* 103.3*  PLT 143* 185 154 148*    Basic Metabolic Panel: Recent Labs  Lab 04/06/22 1608 04/08/22 1857 04/10/22 0625 04/11/22 0242  NA 139 136 136 133*  K 4.2 3.9 3.4* 4.2  CL 103 99 100 102  CO2 '23 27 25 25  '$ GLUCOSE 129* 92 102* 112*  BUN 6* '9 8 9  '$ CREATININE 0.72* 0.75 0.82 0.70  CALCIUM 9.1 9.4 8.7* 8.7*  MG  --   --  1.9 2.1    GFR: Estimated Creatinine Clearance: 76.8 mL/min (by C-G formula based on SCr of 0.7 mg/dL). Liver Function Tests: Recent Labs  Lab 04/08/22 1857  AST 25  ALT 23  ALKPHOS 63  BILITOT 0.8  PROT 7.0  ALBUMIN 3.6    Recent Labs  Lab 04/08/22 1857  LIPASE 37    No results for input(s): "AMMONIA" in the last 168 hours. Coagulation Profile: No results for input(s): "INR", "PROTIME" in the last 168 hours. Cardiac Enzymes: No results for input(s): "CKTOTAL", "CKMB", "CKMBINDEX", "TROPONINI" in the last 168 hours. BNP (last 3 results) No results for input(s): "PROBNP" in the last 8760 hours. HbA1C: No results for input(s): "HGBA1C" in the last 72 hours. CBG: No results for input(s): "GLUCAP" in the last 168 hours. Lipid Profile: No results for input(s): "CHOL", "HDL", "LDLCALC", "TRIG", "CHOLHDL", "LDLDIRECT" in the last 72 hours. Thyroid Function Tests: No  results for input(s): "TSH", "T4TOTAL", "FREET4", "T3FREE", "THYROIDAB" in the last 72 hours. Anemia Panel: No results for input(s): "VITAMINB12", "FOLATE", "FERRITIN", "TIBC", "IRON", "RETICCTPCT" in the last 72 hours. Sepsis Labs: No results for input(s): "PROCALCITON", "LATICACIDVEN" in the last 168 hours.  Recent Results (from the past 240 hour(s))  Urine Culture     Status: Abnormal (Preliminary result)   Collection Time: 04/10/22 12:01 AM   Specimen: Urine, Clean Catch  Result Value Ref Range Status   Specimen Description URINE, CLEAN CATCH  Final   Special Requests NONE  Final   Culture (A)  Final    >=100,000 COLONIES/mL GRAM POSITIVE COCCI IDENTIFICATION AND SUSCEPTIBILITIES TO FOLLOW CULTURE REINCUBATED FOR BETTER GROWTH Performed at [facility], [address], [place], [place] [address]    Report Status PENDING  Incomplete  Surgical pcr screen     Status: None   Collection Time: 04/10/22  2:21 AM   Specimen: Nasal Mucosa; Nasal Swab  Result Value Ref Range Status   MRSA, PCR NEGATIVE NEGATIVE Final   Staphylococcus aureus NEGATIVE NEGATIVE Final    Comment: (NOTE) The Xpert SA Assay (FDA approved for NASAL specimens in patients 50 years of age and older), is one component of a  comprehensive surveillance program. It is not intended to diagnose infection nor to guide or monitor treatment. Performed at [facility], [place] [address], [place], [place] [address]          Radiology Studies: No results found.      Scheduled Meds:  amiodarone  200 mg Oral Daily   arformoterol  15 mcg Nebulization BID   And   umeclidinium bromide  1 puff Inhalation Daily   busPIRone  15 mg Oral BID   carvedilol  3.125 mg Oral BID   docusate sodium  100 mg Oral Daily   fenofibrate  54 mg Oral Daily   fluticasone  2 spray Each Nare Daily   gabapentin  300 mg Oral BID   loratadine  10 mg Oral Daily   magnesium oxide  400 mg Oral Daily   mexiletine  200 mg Oral  BID   pantoprazole  40 mg Oral Daily   polyethylene glycol  17 g Oral Daily   psyllium  1 packet Oral Daily   rosuvastatin  40 mg Oral QHS   sacubitril-valsartan  1 tablet Oral BID   senna-docusate  2 tablet Oral Daily   tamsulosin  0.4 mg Oral QPC supper   Continuous Infusions:          Aline August, MD [facility] 04/11/2022, 7:43 AM

## 2022-04-11 NOTE — Progress Notes (Signed)
   04/10/22 2225  Provider Notification  Provider Name/Title Miles Costain  Date Provider Notified 04/10/22  Time Provider Notified 2225  Method of Notification Page (secure chat)  Notification Reason Other (Comment) (Patient  Blood pressure at [place] was 102/63 pulse 65 ,at 2150 blood pressure was 97/73 pulse 68 patient has coreg 3.125 mg, Sacubitril-valsartan 24-26 mg,Maxitil 200 mg, I'm concern about the blood pressure and pulse,on call notified and order to hold med)  Provider response No new orders  Date of Provider Response 04/10/22  Time of Provider Response 2227 (continue to monitor and follow up)

## 2022-04-11 NOTE — Progress Notes (Signed)
   Cardiologist:  Croitoru/ Lovena Le  Subjective:  Denies SSCP, palpitations or Dyspnea Some urinary incontinence   Objective:  Vitals:   04/10/22 2150 04/10/22 2359 04/11/22 0429 04/11/22 0500  BP: 97/73 93/63 112/67   Pulse: 68 74 67   Resp:  16 18   Temp:  98 F (36.7 C) 97.8 F (36.6 C)   TempSrc:  Oral Oral   SpO2:  96% 93%   Weight:    84.4 kg  Height:        Intake/Output from previous day:  Intake/Output Summary (Last 24 hours) at 04/11/2022 0835 Last data filed at 04/11/2022 0548 Gross per 24 hour  Intake 360 ml  Output 750 ml  Net -390 ml    Physical Exam: Elderly male AICD under left clavicle Lungs clear SEM Post left inguinal hernia repair Bruising over both knees / legs   Lab Results: Basic Metabolic Panel: Recent Labs    04/10/22 0625 04/11/22 0242  NA 136 133*  K 3.4* 4.2  CL 100 102  CO2 25 25  GLUCOSE 102* 112*  BUN 8 9  CREATININE 0.82 0.70  CALCIUM 8.7* 8.7*  MG 1.9 2.1   Liver Function Tests: Recent Labs    04/08/22 1857  AST 25  ALT 23  ALKPHOS 63  BILITOT 0.8  PROT 7.0  ALBUMIN 3.6   Recent Labs    04/08/22 1857  LIPASE 37   CBC: Recent Labs    04/08/22 1857 04/10/22 0625 04/11/22 0242  WBC 5.0 3.5* 7.0  NEUTROABS 3.4  --   --   HGB 12.9* 11.7* 11.3*  HCT 39.9 36.0* 34.1*  MCV 105.0* 104.3* 103.3*  PLT 185 154 148*     Cardiac Studies:  ECG: SR IVCD   Telemetry:  NSR  Echo: 03/19/22 EF 25-30%   Medications:    amiodarone  200 mg Oral Daily   arformoterol  15 mcg Nebulization BID   And   umeclidinium bromide  1 puff Inhalation Daily   busPIRone  15 mg Oral BID   carvedilol  3.125 mg Oral BID   docusate sodium  100 mg Oral Daily   fenofibrate  54 mg Oral Daily   fluticasone  2 spray Each Nare Daily   gabapentin  300 mg Oral BID   loratadine  10 mg Oral Daily   magnesium oxide  400 mg Oral Daily   mexiletine  200 mg Oral BID   pantoprazole  40 mg Oral Daily   polyethylene glycol  17 g Oral  Daily   psyllium  1 packet Oral Daily   rosuvastatin  40 mg Oral QHS   sacubitril-valsartan  1 tablet Oral BID   senna-docusate  2 tablet Oral Daily   tamsulosin  0.4 mg Oral QPC supper      Assessment/Plan:  82 y.o. with history of ischemic DCM EF 25% AICD due to be changed out 04/20/22 PAF post ablation on amiodarone with 4.5 cm thoracic aneurysm Most recent cath 03/19/22 with stable anatomy patent stents in LAD/RCA medical Rx Now post Left incarcerated hernia repair  DCM:  euvolemic continue home meds including entresto  AICD:  no d/c on amiodarone device ERI to be changed 04/20/22 CAD:  no anting stable  PAF:  resume eliquis when ok with surgery on both amiodarone and mexiletine  Jenkins Rouge 04/11/2022, 8:35 AM

## 2022-04-12 DIAGNOSIS — Z95810 Presence of automatic (implantable) cardiac defibrillator: Secondary | ICD-10-CM | POA: Diagnosis not present

## 2022-04-12 DIAGNOSIS — I482 Chronic atrial fibrillation, unspecified: Secondary | ICD-10-CM

## 2022-04-12 DIAGNOSIS — Z5181 Encounter for therapeutic drug level monitoring: Secondary | ICD-10-CM | POA: Diagnosis not present

## 2022-04-12 DIAGNOSIS — K4030 Unilateral inguinal hernia, with obstruction, without gangrene, not specified as recurrent: Secondary | ICD-10-CM | POA: Diagnosis not present

## 2022-04-12 DIAGNOSIS — Z7901 Long term (current) use of anticoagulants: Secondary | ICD-10-CM | POA: Diagnosis not present

## 2022-04-12 DIAGNOSIS — I10 Essential (primary) hypertension: Secondary | ICD-10-CM | POA: Diagnosis not present

## 2022-04-12 DIAGNOSIS — I5022 Chronic systolic (congestive) heart failure: Secondary | ICD-10-CM | POA: Diagnosis not present

## 2022-04-12 LAB — CBC
HCT: 32.8 % — ABNORMAL LOW (ref 39.0–52.0)
Hemoglobin: 10.7 g/dL — ABNORMAL LOW (ref 13.0–17.0)
MCH: 34.1 pg — ABNORMAL HIGH (ref 26.0–34.0)
MCHC: 32.6 g/dL (ref 30.0–36.0)
MCV: 104.5 fL — ABNORMAL HIGH (ref 80.0–100.0)
Platelets: 131 10*3/uL — ABNORMAL LOW (ref 150–400)
RBC: 3.14 MIL/uL — ABNORMAL LOW (ref 4.22–5.81)
RDW: 17.3 % — ABNORMAL HIGH (ref 11.5–15.5)
WBC: 5.2 10*3/uL (ref 4.0–10.5)
nRBC: 0 % (ref 0.0–0.2)

## 2022-04-12 MED ORDER — APIXABAN 5 MG PO TABS
5.0000 mg | ORAL_TABLET | Freq: Two times a day (BID) | ORAL | Status: DC
  Administered 2022-04-12: 5 mg via ORAL
  Filled 2022-04-12: qty 1

## 2022-04-12 MED ORDER — HYDROCODONE-ACETAMINOPHEN 5-325 MG PO TABS: 0.5000 | ORAL_TABLET | Freq: Four times a day (QID) | ORAL | 0 refills | Status: DC | PRN

## 2022-04-12 NOTE — Discharge Summary (Signed)
Physician Discharge Summary  LONIE NEWSHAM ZRA:[id number] DOB: 08-29-1940 DOA: 04/08/2022  PCP: Yvonna Alanis, NP  Admit date: 04/08/2022 Discharge date: 04/12/2022  Admitted From: Home Disposition: Home  Recommendations for Outpatient Follow-up:  Follow up with PCP in 1 week with repeat CBC/BMP Outpatient follow-up with general surgery.  Wound care/discharge pain management as per general surgery Outpatient follow-up with cardiology Follow up in ED if symptoms worsen or new appear   Home Health: No Equipment/Devices: None  Discharge Condition: Stable CODE STATUS: Full Diet recommendation: Heart healthy/fluid restriction of up to 1200 cc a day  Brief/Interim Summary: 82 y.o. male with medical history significant of hypertension, hyperlipidemia, CAD s/p inferior MI/cardiac arrest 1995 and PCI of LAD/RCA in 2011, recent hospitalization in 12/2 and 23 with non-STEMI requiring cardiac catheterization with recommendations of medical management, heart failure with reduced EF, ischemic cardiomyopathy  s/p AICD, VT  s/p ablation 2016, PAF on Eliquis, COPD, CKD stage III, and AAA presented with worsening abdominal pain and groin pain.  On presentation, CT of the abdomen and pelvis showed a large left inguinal hernia containing sigmoid colon and a loop of small bowel with the herniated loop of small bowel mildly distended and containing feculent material compatible with partial obstruction.  General surgery and cardiology were consulted.  He underwent surgical intervention on 04/10/2022.  Subsequently, he has remained hemodynamically stable and tolerating diet.  General surgery has cleared him for discharge.  He will be discharged home today with outpatient follow-up with PCP and general surgery.  Discharge Diagnoses:   Incarcerated left inguinal hernia Partial small bowel obstruction -Status post open left inguinal hernia repair on 04/10/2022 by general surgery.   -Subsequently, he has remained  hemodynamically stable and tolerating diet.  General surgery has cleared him for discharge.  He will be discharged home today with outpatient follow-up with PCP and general surgery.   Paroxysmal A-fib on chronic anticoagulation Status post AICD; history of DVT -Resume Eliquis on discharge as general surgery has given clearance.  Continue amiodarone, Coreg and mexiletine.  Outpatient follow-up with cardiology.  Chronic systolic heart failure CAD Hyperlipidemia Essential hypertension -Compensated.  Echo in 12/23 had shown EF of 25 to 30% with grade 1 diastolic dysfunction.  Continue diet and fluid restriction. -Continue Coreg and Entresto.  Cardiology following.  Outpatient follow-up with cardiology. -Most recent cardiac catheterization had recommended medical management.  Continue statin.  Currently chest pain-free   COPD, without acute exacerbation -No significant wheezing appreciated on physical exam at this time.  O2 saturations currently maintained on room air. -Continue home regimen  AAA -Patient has a 4.5 cm thoracic aortic aneurysm and a 3.4 cm infrarenal abdominal aortic aneurysm -Continue outpatient surveillance and follow-up   Macrocytic anemia -Questionable cause.  Hemoglobin stable.  Outpatient follow-up   Thrombocytopenia -Questionable cause.  Outpatient follow-up  Leukopenia -Resolved   Hyponatremia -Mild.  Monitor   Hypokalemia -Resolved  Discharge Instructions   Allergies as of 04/12/2022       Reactions   Sulfa Antibiotics Hives        Medication List     STOP taking these medications    mupirocin ointment 2 % Commonly known as: BACTROBAN       TAKE these medications    albuterol (2.5 MG/3ML) 0.083% nebulizer solution Commonly known as: PROVENTIL USE 1 VIAL VIA NEBULIZER EVERY 6 HOURS AS NEEDED FOR WHEEZING OR SHORTNESS OF BREATH What changed:  how much to take how to take this when to take this  amiodarone 200 MG tablet Commonly  known as: PACERONE TAKE ONE TABLET BY MOUTH DAILY   apixaban 5 MG Tabs tablet Commonly known as: Eliquis Take 1 tablet (5 mg total) by mouth 2 (two) times daily.   ascorbic acid 500 MG tablet Commonly known as: VITAMIN C Take 1 tablet by mouth daily.   aspirin EC 81 MG tablet Take 1 tablet (81 mg total) by mouth daily. Swallow whole.   bisacodyl 5 MG EC tablet Commonly known as: DULCOLAX Take 5 mg by mouth.   busPIRone 15 MG tablet Commonly known as: BUSPAR Take 15 mg by mouth 2 (two) times daily. Patient taking 10 mg by mouth twice daily   calcium citrate 950 (200 Ca) MG tablet Commonly known as: CALCITRATE - dosed in mg elemental calcium Take 200 mg of elemental calcium by mouth daily.   carvedilol 3.125 MG tablet Commonly known as: COREG Take 1 tablet (3.125 mg total) by mouth 2 (two) times daily.   CENTRUM ADULTS PO Take by mouth daily.   cetirizine 10 MG tablet Commonly known as: ZYRTEC Take 1 tablet by mouth daily.   Combivent Respimat 20-100 MCG/ACT Aers respimat Generic drug: Ipratropium-Albuterol Inhale 1 puff into the lungs every 6 (six) hours. Shortness of breath or wheezing   Entresto 24-26 MG Generic drug: sacubitril-valsartan Take 1 tablet by mouth 2 (two) times daily.   escitalopram 20 MG tablet Commonly known as: LEXAPRO Take 20 mg by mouth daily. Take 1/2 tablet (10 mg total ) by mouth daily   Fenofibric Acid 35 MG Tabs Take 35 mg by mouth daily.   fluticasone 50 MCG/ACT nasal spray Commonly known as: FLONASE Place 2 sprays into both nostrils daily.   Flutter Devi Use as directed What changed:  how much to take how to take this when to take this   furosemide 20 MG tablet Commonly known as: LASIX Take 1 tablet (20 mg total) by mouth daily as needed for fluid or edema.   gabapentin 300 MG capsule Commonly known as: NEURONTIN Take 1 capsule (300 mg total) by mouth 2 (two) times daily.   guaiFENesin 600 MG 12 hr tablet Commonly  known as: MUCINEX Take 1 tablet by mouth 2 (two) times daily.   HYDROcodone-acetaminophen 5-325 MG tablet Commonly known as: NORCO/VICODIN Take 0.5 tablets by mouth every 6 (six) hours as needed for moderate pain. What changed:  how much to take when to take this   IRON PO Take 1 tablet by mouth daily.   Jardiance 10 MG Tabs tablet Generic drug: empagliflozin Take 1 tablet (10 mg total) by mouth daily.   lidocaine 5 % Commonly known as: Lidoderm Place 1 patch onto the skin daily. Remove & Discard patch within 12 hours or as directed by MD   MAGnesium-Oxide 400 (241.3 Mg) MG tablet Generic drug: magnesium oxide TAKE 1 TABSULE BY MOUTH DAILY What changed: See the new instructions.   mexiletine 200 MG capsule Commonly known as: MEXITIL Take 1 capsule (200 mg total) by mouth 2 (two) times daily.   nitroGLYCERIN 0.4 MG SL tablet Commonly known as: NITROSTAT Place 1 tablet (0.4 mg total) under the tongue every 5 (five) minutes as needed for chest pain. DISSOLVE 1 TABLET UNDER THE TONGUE EVERY 5 MINUTES FOR 3 DOSES   omeprazole 20 MG capsule Commonly known as: PRILOSEC Take 20 mg by mouth daily.   optichamber diamond Misc optichamber [place] What changed:  how much to take how to take this when to take this  polyethylene glycol powder 17 GM/SCOOP powder Commonly known as: GLYCOLAX/MIRALAX Take 1 capful in 8 ounces of fluid each morning   rosuvastatin 40 MG tablet Commonly known as: CRESTOR Take 1 tablet (40 mg total) by mouth daily. What changed: when to take this   SOOTHE XP OP Place 2 drops into both eyes daily as needed (dry eyes).   Stiolto Respimat 2.5-2.5 MCG/ACT Aers Generic drug: Tiotropium Bromide-Olodaterol Inhale 2 puffs into the lungs daily.   STOOL SOFTENER/LAXATIVE PO Take 2 tablets by mouth daily.   tamsulosin 0.4 MG Caps capsule Commonly known as: FLOMAX TAKE ONE CAPSULE BY MOUTH DAILY AFTER SUPPER What changed:  how much to take how to take  this when to take this   Vitamin B-12 5000 MCG Subl Place under the tongue daily.          Follow-up Information     Rolm Bookbinder, MD. Call in 3 week(s).   Specialty: General Surgery Why: call office to make appointment for post op check Contact information: [address] [place] [address] [phone]         Yvonna Alanis, NP. Schedule an appointment as soon as possible for a visit in 1 week(s).   Specialty: Adult Health Nurse Practitioner Contact information: [address] [place] [address] [phone]                Allergies  Allergen Reactions   Sulfa Antibiotics Hives    Consultations: General surgery/cardiology   Procedures/Studies: CT ABDOMEN PELVIS W CONTRAST  Result Date: 04/08/2022 CLINICAL DATA:  Right lower quadrant abdominal pain radiating to the right groin. History of inguinal hernia. EXAM: CT ABDOMEN AND PELVIS WITH CONTRAST TECHNIQUE: Multidetector CT imaging of the abdomen and pelvis was performed using the standard protocol following bolus administration of intravenous contrast. RADIATION DOSE REDUCTION: This exam was performed according to the departmental dose-optimization program which includes automated exposure control, adjustment of the mA and/or kV according to patient size and/or use of iterative reconstruction technique. CONTRAST:  30m OMNIPAQUE IOHEXOL 350 MG/ML SOLN COMPARISON:  03/08/2022 FINDINGS: Lower chest: Small hiatal hernia.  No acute abnormality Hepatobiliary: Pneumobilia. Unremarkable gallbladder. No suspicious focal hepatic lesion. Pancreas: Unremarkable. Spleen: Unremarkable. Adrenals/Urinary Tract: Unchanged adrenal glands. No obstructing urinary calculi or hydronephrosis. The bladder is not well distended. Stomach/Bowel: Herniation of the sigmoid and distal descending colon into the large left inguinal hernia. There is sigmoid diverticula with wall thickening of the sigmoid colon  within the hernia sac. Additional herniation of a loop of small bowel into the left inguinal hernia sac. There is fecalization of small bowel contents within the hernia sac and borderline dilation of the herniated loop of small bowel. Normal appendix. Redemonstrated rectal wall thickening though this is decreased from prior. Vascular/Lymphatic: Aortic atherosclerotic calcification. Focal dilation of the infrarenal abdominal aorta measuring 34 mm in diameter. No suspicious lymphadenopathy. Reproductive: Unremarkable. Other: Large left and small right inguinal hernias. No free intraperitoneal air. Musculoskeletal: No acute fracture. Right THA. Which versus preference IMPRESSION: 1. Large left inguinal hernia containing sigmoid colon and a loop of small bowel 2. The herniated loop of small bowel is mildly distended and contains feculent material compatible with partial obstruction. 3. The herniated loop of sigmoid colon contains multiple diverticula with mild wall thickening suggestive of diverticulitis/colitis. 4. Redemonstrated proctitis though this is improved from 03/08/2022. 5. Small hiatal hernia. 6. Infrarenal abdominal aorta measuring 34 mm in maximum diameter. Recommend follow-up ultrasound every 3 years. This  recommendation follows ACR consensus guidelines: White Paper of the ACR Incidental Findings Committee II on Vascular Findings. J Am Coll Radiol 2013; 10:789-794. 7. Aortic Atherosclerosis (ICD10-I70.0). Electronically Signed   By: Placido Sou M.D.   On: 04/08/2022 22:37   ECHOCARDIOGRAM COMPLETE  Result Date: 03/19/2022    ECHOCARDIOGRAM REPORT   Patient Name:   BONNER LARUE Date of Exam: 03/19/2022 Medical Rec #:  [id number]        Height:       72.0 in Accession #:    [id number]       Weight:       192.2 lb Date of Birth:  05-Jul-1940        BSA:          2.095 m Patient Age:    82 years         BP:           139/100 mmHg Patient Gender: M                HR:           72 bpm. Exam Location:   Inpatient Procedure: 2D Echo, Cardiac Doppler and Color Doppler Indications:    R07.9 Chest Pain  History:        Patient has prior history of Echocardiogram examinations, most                 recent 04/16/2021. CHF, Previous Myocardial Infarction and CAD,                 COPD; Risk Factors:Hypertension and Dyslipidemia. ICD                 (implantable cardioverter-defibrillator) in place, Tobacco                 abuse.  Sonographer:    Alvino Chapel RCS Referring Phys: [id number] PETER M [place] IMPRESSIONS  1. Left ventricular ejection fraction, by estimation, is 25 to 30%. The left ventricle has severely decreased function. The left ventricle demonstrates regional wall motion abnormalities with inferior akinesis, basal to mid inferolateral akinesis, anterolateral severe hypokinesis. The left ventricular internal cavity size was mildly dilated. There is mild concentric left ventricular hypertrophy. Left ventricular diastolic parameters are consistent with Grade I diastolic dysfunction (impaired relaxation).  2. Right ventricular systolic function is normal. The right ventricular size is normal. There is normal pulmonary artery systolic pressure. The estimated right ventricular systolic pressure is 54.6 mmHg.  3. Left atrial size was mildly dilated.  4. Right atrial size was mildly dilated.  5. The mitral valve is abnormal. Mild to moderate mitral valve regurgitation. No evidence of mitral stenosis.  6. The aortic valve is tricuspid. There is mild calcification of the aortic valve. Aortic valve regurgitation is mild. No aortic stenosis is present.  7. Aortic dilatation noted. There is mild dilatation of the aortic root, measuring 39 mm.  8. The inferior vena cava is dilated in size with >50% respiratory variability, suggesting right atrial pressure of 8 mmHg. FINDINGS  Left Ventricle: Left ventricular ejection fraction, by estimation, is 25 to 30%. The left ventricle has severely decreased function. The left ventricle  demonstrates regional wall motion abnormalities. The left ventricular internal cavity size was mildly  dilated. There is mild concentric left ventricular hypertrophy. Left ventricular diastolic parameters are consistent with Grade I diastolic dysfunction (impaired relaxation). Right Ventricle: The right ventricular size is normal. No increase in right ventricular wall thickness. Right ventricular systolic function  is normal. There is normal pulmonary artery systolic pressure. The tricuspid regurgitant velocity is 2.28 m/s, and  with an assumed right atrial pressure of 8 mmHg, the estimated right ventricular systolic pressure is 73.2 mmHg. Left Atrium: Left atrial size was mildly dilated. Right Atrium: Right atrial size was mildly dilated. Pericardium: There is no evidence of pericardial effusion. Mitral Valve: The mitral valve is abnormal. Mild to moderate mitral valve regurgitation. No evidence of mitral valve stenosis. Tricuspid Valve: The tricuspid valve is normal in structure. Tricuspid valve regurgitation is trivial. Aortic Valve: The aortic valve is tricuspid. There is mild calcification of the aortic valve. Aortic valve regurgitation is mild. No aortic stenosis is present. Aortic valve mean gradient measures 6.0 mmHg. Aortic valve peak gradient measures 12.5 mmHg. Aortic valve area, by VTI measures 2.35 cm. Pulmonic Valve: The pulmonic valve was normal in structure. Pulmonic valve regurgitation is trivial. Aorta: Aortic dilatation noted. There is mild dilatation of the aortic root, measuring 39 mm. Venous: The inferior vena cava is dilated in size with greater than 50% respiratory variability, suggesting right atrial pressure of 8 mmHg. IAS/Shunts: No atrial level shunt detected by color flow Doppler. Additional Comments: A device lead is visualized in the right ventricle.  LEFT VENTRICLE PLAX 2D LVIDd:         6.00 cm      Diastology LVIDs:         5.00 cm      LV e' medial:    3.70 cm/s LV PW:         1.20  cm      LV E/e' medial:  11.4 LV IVS:        1.40 cm      LV e' lateral:   3.59 cm/s LVOT diam:     2.20 cm      LV E/e' lateral: 11.7 LV SV:         84 LV SV Index:   40 LVOT Area:     3.80 cm  LV Volumes (MOD) LV vol d, MOD A2C: 214.0 ml LV vol d, MOD A4C: 242.0 ml LV vol s, MOD A2C: 172.0 ml LV vol s, MOD A4C: 166.0 ml LV SV MOD A2C:     42.0 ml LV SV MOD A4C:     242.0 ml LV SV MOD BP:      57.0 ml RIGHT VENTRICLE RV S prime:     12.00 cm/s TAPSE (M-mode): 2.1 cm LEFT ATRIUM             Index        RIGHT ATRIUM           Index LA diam:        3.20 cm 1.53 cm/m   RA Area:     26.70 cm LA Vol (A2C):   70.0 ml 33.41 ml/m  RA Volume:   90.50 ml  43.20 ml/m LA Vol (A4C):   60.9 ml 29.07 ml/m LA Biplane Vol: 65.9 ml 31.46 ml/m  AORTIC VALVE AV Area (Vmax):    1.94 cm AV Area (Vmean):   1.97 cm AV Area (VTI):     2.35 cm AV Vmax:           177.00 cm/s AV Vmean:          108.000 cm/s AV VTI:            0.357 m AV Peak Grad:      12.5 mmHg AV Mean Grad:  6.0 mmHg LVOT Vmax:         90.10 cm/s LVOT Vmean:        56.100 cm/s LVOT VTI:          0.221 m LVOT/AV VTI ratio: 0.62  AORTA Ao Root diam: 3.90 cm MITRAL VALVE                  TRICUSPID VALVE MV Area (PHT): 3.34 cm       TR Peak grad:   20.8 mmHg MV Decel Time: 227 msec       TR Vmax:        228.00 cm/s MR Peak grad:    136.9 mmHg MR Mean grad:    75.0 mmHg    SHUNTS MR Vmax:         585.00 cm/s  Systemic VTI:  0.22 m MR Vmean:        399.0 cm/s   Systemic Diam: 2.20 cm MR PISA:         2.26 cm MR PISA Eff ROA: 9 mm MR PISA Radius:  0.60 cm MV E velocity: 42.00 cm/s MV A velocity: 101.00 cm/s MV E/A ratio:  0.42 Dalton McleanMD Electronically signed by Franki Monte Signature Date/Time: 03/19/2022/6:18:40 PM    Final    CARDIAC CATHETERIZATION  Result Date: 03/19/2022   Mid LM to Dist LM lesion is 20% stenosed.   Ost LAD to Prox LAD lesion is 25% stenosed.   Prox LAD to Mid LAD lesion is 35% stenosed.   3rd Mrg lesion is 100% stenosed.   Prox  RCA to Mid RCA lesion is 40% stenosed.   RPDA lesion is 80% stenosed.   There is mild left ventricular systolic dysfunction.   LV end diastolic pressure is mildly elevated.   The left ventricular ejection fraction is 45-50% by visual estimate. 2 vessel obstructive CAD - 100% occlusion of distal OM3 appears to be the culprit. The ostial PDA lesion is unchanged from 2014 Patent stents in the LAD and RCA Mild LV dysfunction with inferior wall motion abnormality Mildly elevated LVEDP 16 mm Hg Plan: recommend medical therapy  DG Chest 2 View  Result Date: 03/18/2022 CLINICAL DATA:  Chest pain EXAM: CHEST - 2 VIEW COMPARISON:  02/25/2022 FINDINGS: Normal heart size and stable aortic tortuosity. Sizable hiatal hernia. Dual-chamber ICD leads from the left. There is no edema, consolidation, effusion, or pneumothorax. Artifact from EKG leads. IMPRESSION: Stable exam.  No acute finding. Electronically Signed   By: Jorje Guild M.D.   On: 03/18/2022 05:45   CUP PACEART INCLINIC DEVICE CHECK  Result Date: 03/13/2022 ICD check in clinic. Normal device function. Thresholds and sensing consistent with previous device measurements. Impedance trends stable over time. Multiple ventricular arrhythmias-Dr. Lovena Le aware. Histogram distribution appropriate for patient and level of activity. Increased RV output to 2.4 V at 0.4 ms d/t slight increase in threshold. Device programmed at appropriate safety margins. Device programmed to optimize intrinsic conduction. ERI.  Pt scheduled for gen change.Myrtie Hawk, BSN, RN     Subjective: Patient seen and examined at bedside.  Feels much better and feels okay to go home.  Complains of some left groin pain.  Denies worsening shortness of breath, fever, chest pain  Discharge Exam: Vitals:   04/11/22 2220 04/12/22 0527  BP: 123/69 115/69  Pulse: 70 70  Resp: 17 18  Temp: 99.2 F (37.3 C) 98.2 F (36.8 C)  SpO2: 94% 91%    General: Pt is alert, awake,  not in acute  distress.  Currently on room air. Cardiovascular: rate controlled, S1/S2 + Respiratory: bilateral decreased breath sounds at bases with some scattered crackles Abdominal: Soft, mild left inguinal tenderness present, ND, bowel sounds + Extremities: Trace lower extremity edema present no cyanosis    The results of significant diagnostics from this hospitalization (including imaging, microbiology, ancillary and laboratory) are listed below for reference.     Microbiology: Recent Results (from the past 240 hour(s))  Urine Culture     Status: Abnormal (Preliminary result)   Collection Time: 04/10/22 12:01 AM   Specimen: Urine, Clean Catch  Result Value Ref Range Status   Specimen Description URINE, CLEAN CATCH  Final   Special Requests NONE  Final   Culture (A)  Final    >=100,000 COLONIES/mL ENTEROCOCCUS FAECALIS REPEATING SUSCEPTIBILITIES Performed at [facility], [place] [address] [place], [place] [address]    Report Status PENDING  Incomplete  Surgical pcr screen     Status: None   Collection Time: 04/10/22  2:21 AM   Specimen: Nasal Mucosa; Nasal Swab  Result Value Ref Range Status   MRSA, PCR NEGATIVE NEGATIVE Final   Staphylococcus aureus NEGATIVE NEGATIVE Final    Comment: (NOTE) The Xpert SA Assay (FDA approved for NASAL specimens in patients 17 years of age and older), is one component of a comprehensive surveillance program. It is not intended to diagnose infection nor to guide or monitor treatment. Performed at [facility], [place] [address] [place], [place] [address]      Labs: BNP (last 3 results) Recent Labs    03/18/22 0545  BNP 44.0   Basic Metabolic Panel: Recent Labs  Lab 04/06/22 1608 04/08/22 1857 04/10/22 0625 04/11/22 0242  NA 139 136 136 133*  K 4.2 3.9 3.4* 4.2  CL 103 99 100 102  CO2 '23 27 25 25  '$ GLUCOSE 129* 92 102* 112*  BUN 6* '9 8 9  '$ CREATININE 0.72* 0.75 0.82 0.70  CALCIUM 9.1 9.4 8.7* 8.7*  MG  --   --  1.9 2.1    Liver Function Tests: Recent Labs  Lab 04/08/22 1857  AST 25  ALT 23  ALKPHOS 63  BILITOT 0.8  PROT 7.0  ALBUMIN 3.6   Recent Labs  Lab 04/08/22 1857  LIPASE 37   No results for input(s): "AMMONIA" in the last 168 hours. CBC: Recent Labs  Lab 04/06/22 1608 04/08/22 1857 04/10/22 0625 04/11/22 0242 04/12/22 0225  WBC 4.0 5.0 3.5* 7.0 5.2  NEUTROABS 2.5 3.4  --   --   --   HGB 12.3* 12.9* 11.7* 11.3* 10.7*  HCT 36.9* 39.9 36.0* 34.1* 32.8*  MCV 101* 105.0* 104.3* 103.3* 104.5*  PLT 143* 185 154 148* 131*   Cardiac Enzymes: No results for input(s): "CKTOTAL", "CKMB", "CKMBINDEX", "TROPONINI" in the last 168 hours. BNP: Invalid input(s): "POCBNP" CBG: No results for input(s): "GLUCAP" in the last 168 hours. D-Dimer No results for input(s): "DDIMER" in the last 72 hours. Hgb A1c No results for input(s): "HGBA1C" in the last 72 hours. Lipid Profile No results for input(s): "CHOL", "HDL", "LDLCALC", "TRIG", "CHOLHDL", "LDLDIRECT" in the last 72 hours. Thyroid function studies No results for input(s): "TSH", "T4TOTAL", "T3FREE", "THYROIDAB" in the last 72 hours.  Invalid input(s): "FREET3" Anemia work up No results for input(s): "VITAMINB12", "FOLATE", "FERRITIN", "TIBC", "IRON", "RETICCTPCT" in the last 72 hours. Urinalysis    Component Value Date/Time   COLORURINE YELLOW 04/09/2022 [place]  04/09/2022 1100   LABSPEC 1.028 04/09/2022 1100   PHURINE 5.0 04/09/2022 1100   GLUCOSEU >=500 (A) 04/09/2022 1100   GLUCOSEU NEGATIVE 05/24/2015 1007   HGBUR NEGATIVE 04/09/2022 1100   BILIRUBINUR NEGATIVE 04/09/2022 1100   BILIRUBINUR Negative 12/11/2021 1529   KETONESUR NEGATIVE 04/09/2022 1100   PROTEINUR NEGATIVE 04/09/2022 1100   UROBILINOGEN 0.2 12/11/2021 1529   UROBILINOGEN 1.0 05/24/2015 1007   NITRITE NEGATIVE 04/09/2022 1100   LEUKOCYTESUR TRACE (A) 04/09/2022 1100   Sepsis Labs Recent Labs  Lab 04/08/22 1857 04/10/22 0625  04/11/22 0242 04/12/22 0225  WBC 5.0 3.5* 7.0 5.2   Microbiology Recent Results (from the past 240 hour(s))  Urine Culture     Status: Abnormal (Preliminary result)   Collection Time: 04/10/22 12:01 AM   Specimen: Urine, Clean Catch  Result Value Ref Range Status   Specimen Description URINE, CLEAN CATCH  Final   Special Requests NONE  Final   Culture (A)  Final    >=100,000 COLONIES/mL ENTEROCOCCUS FAECALIS REPEATING SUSCEPTIBILITIES Performed at [facility], [place] [address] [place], [place] [address]    Report Status PENDING  Incomplete  Surgical pcr screen     Status: None   Collection Time: 04/10/22  2:21 AM   Specimen: Nasal Mucosa; Nasal Swab  Result Value Ref Range Status   MRSA, PCR NEGATIVE NEGATIVE Final   Staphylococcus aureus NEGATIVE NEGATIVE Final    Comment: (NOTE) The Xpert SA Assay (FDA approved for NASAL specimens in patients 61 years of age and older), is one component of a comprehensive surveillance program. It is not intended to diagnose infection nor to guide or monitor treatment. Performed at [facility] Hospital Lab, [place] [address] [place],  [address]      Time coordinating discharge: 35 minutes  SIGNED:   Aline August, MD  [facility] 04/12/2022, 7:55 AM

## 2022-04-12 NOTE — Plan of Care (Signed)
  Problem: Education: Goal: Understanding of cardiac disease, CV risk reduction, and recovery process will improve Outcome: Completed/Met Goal: Individualized Educational Video(s) Outcome: Completed/Met   Problem: Activity: Goal: Ability to tolerate increased activity will improve Outcome: Completed/Met   Problem: Cardiac: Goal: Ability to achieve and maintain adequate cardiovascular perfusion will improve Outcome: Completed/Met   Problem: Health Behavior/Discharge Planning: Goal: Ability to safely manage health-related needs after discharge will improve Outcome: Completed/Met   Problem: Education: Goal: Understanding of CV disease, CV risk reduction, and recovery process will improve Outcome: Completed/Met Goal: Individualized Educational Video(s) Outcome: Completed/Met   Problem: Activity: Goal: Ability to return to baseline activity level will improve Outcome: Completed/Met   Problem: Cardiovascular: Goal: Ability to achieve and maintain adequate cardiovascular perfusion will improve Outcome: Completed/Met Goal: Vascular access site(s) Level 0-1 will be maintained Outcome: Completed/Met   Problem: Health Behavior/Discharge Planning: Goal: Ability to safely manage health-related needs after discharge will improve Outcome: Completed/Met   Problem: Education: Goal: Knowledge of cardiac device and self-care will improve Outcome: Completed/Met Goal: Ability to safely manage health related needs after discharge will improve Outcome: Completed/Met Goal: Individualized Educational Video(s) Outcome: Completed/Met   Problem: Cardiac: Goal: Ability to achieve and maintain adequate cardiopulmonary perfusion will improve Outcome: Completed/Met   Problem: Education: Goal: Knowledge of General Education information will improve Description: Including pain rating scale, medication(s)/side effects and non-pharmacologic comfort measures Outcome: Completed/Met   Problem: Health  Behavior/Discharge Planning: Goal: Ability to manage health-related needs will improve Outcome: Completed/Met   Problem: Clinical Measurements: Goal: Ability to maintain clinical measurements within normal limits will improve Outcome: Completed/Met Goal: Will remain free from infection Outcome: Completed/Met Goal: Diagnostic test results will improve Outcome: Completed/Met Goal: Respiratory complications will improve Outcome: Completed/Met Goal: Cardiovascular complication will be avoided Outcome: Completed/Met   Problem: Activity: Goal: Risk for activity intolerance will decrease Outcome: Completed/Met   Problem: Nutrition: Goal: Adequate nutrition will be maintained Outcome: Completed/Met   Problem: Coping: Goal: Level of anxiety will decrease Outcome: Completed/Met   Problem: Elimination: Goal: Will not experience complications related to bowel motility Outcome: Completed/Met Goal: Will not experience complications related to urinary retention Outcome: Completed/Met   Problem: Pain Managment: Goal: General experience of comfort will improve Outcome: Completed/Met   Problem: Safety: Goal: Ability to remain free from injury will improve Outcome: Completed/Met   Problem: Skin Integrity: Goal: Risk for impaired skin integrity will decrease Outcome: Completed/Met

## 2022-04-12 NOTE — Discharge Instructions (Signed)
[facility] _______[facility]  UMBILICAL OR INGUINAL HERNIA REPAIR: POST OP INSTRUCTIONS  Always review your discharge instruction sheet given to you by the facility where your surgery was performed. IF YOU HAVE DISABILITY OR FAMILY LEAVE FORMS, YOU MUST BRING THEM TO THE OFFICE FOR PROCESSING.   DO NOT GIVE THEM TO YOUR DOCTOR.  1. A  prescription for pain medication may be given to you upon discharge.  Take your pain medication as prescribed, if needed.  If narcotic pain medicine is not needed, then you may take acetaminophen (Tylenol) or ibuprofen (Advil) as needed. 2. Take your usually prescribed medications unless otherwise directed. If you need a refill on your pain medication, please contact your pharmacy.  They will contact our office to request authorization. Prescriptions will not be filled after 5 pm or on week-ends. 3. You should follow a light diet the first 24 hours after arrival home, such as soup and crackers, etc.  Be sure to include lots of fluids daily.  Resume your normal diet the day after surgery. 4.Most patients will experience some swelling and bruising around the umbilicus or in the groin and scrotum.  Ice packs and reclining will help.  Swelling and bruising can take several days to resolve.  6. It is common to experience some constipation if taking pain medication after surgery.  Increasing fluid intake and taking a stool softener (such as Colace) will usually help or prevent this problem from occurring.  A mild laxative (Milk of Magnesia or Miralax) should be taken according to package directions if there are no bowel movements after 48 hours. 7. Unless discharge instructions indicate otherwise, you may remove your bandages 24-48 hours after surgery, and you may shower at that time.  You may have steri-strips (small skin tapes) in place directly over the incision.  These strips should be left on the skin for 7-10 days.  If your surgeon used skin glue on the  incision, you may shower in 24 hours.  The glue will flake off over the next 2-3 weeks.  Any sutures or staples will be removed at the office during your follow-up visit. 8. ACTIVITIES:  You may resume regular (light) daily activities beginning the next day--such as daily self-care, walking, climbing stairs--gradually increasing activities as tolerated.  You may have sexual intercourse when it is comfortable.  Refrain from any heavy lifting or straining until approved by your doctor.  a.You may drive when you are no longer taking prescription pain medication, you can comfortably wear a seatbelt, and you can safely maneuver your car and apply brakes. b.RETURN TO WORK:   _____________________________________________  9.You should see your doctor in the office for a follow-up appointment approximately 2-3 weeks after your surgery.  Make sure that you call for this appointment within a day or two after you arrive home to insure a convenient appointment time. 10.OTHER INSTRUCTIONS: _YOU MAY SHOWER STARTING TODAY ICE PACK, HEATING PAD, EXTRA STRENGTH TYLENOL ALSO FOR PAIN________________________    _____________________________________  WHEN TO CALL YOUR DOCTOR: Fever over 101.0 Inability to urinate Nausea and/or vomiting Extreme swelling or bruising Continued bleeding from incision. Increased pain, redness, or drainage from the incision  The clinic staff is available to answer your questions during regular business hours.  Please don't hesitate to call and ask to speak to one of the nurses for clinical concerns.  If you have a medical emergency, go to the nearest emergency room or call 911.  A surgeon from [facility] is  always on call at the hospital   [address], [place], [place], [place]  [address] ?  [address], [place], [place]   [phone] ? [phone] ? FAX [phone] Web site: [email or web address]

## 2022-04-12 NOTE — Progress Notes (Signed)
2 Days Post-Op   Subjective/Chief Complaint: Having soreness as expected Tolerating po   Objective: Vital signs in last 24 hours: Temp:  [98 F (36.7 C)-99.2 F (37.3 C)] 98.2 F (36.8 C) (01/14 0527) Pulse Rate:  [61-74] 70 (01/14 0527) Resp:  [16-18] 18 (01/14 0527) BP: (98-123)/(59-69) 115/69 (01/14 0527) SpO2:  [91 %-94 %] 91 % (01/14 0527) Last BM Date : 04/08/22  Intake/Output from previous day: 01/13 0701 - 01/14 0700 In: 960 [P.O.:960] Out: 700 [Urine:700] Intake/Output this shift: No intake/output data recorded.  Exam: Awake and alert Looks good Abdomen soft, left groin incision clean, no hematoma Normal post op swelling  Lab Results:  Recent Labs    04/11/22 0242 04/12/22 0225  WBC 7.0 5.2  HGB 11.3* 10.7*  HCT 34.1* 32.8*  PLT 148* 131*   BMET Recent Labs    04/10/22 0625 04/11/22 0242  NA 136 133*  K 3.4* 4.2  CL 100 102  CO2 25 25  GLUCOSE 102* 112*  BUN 8 9  CREATININE 0.82 0.70  CALCIUM 8.7* 8.7*   PT/INR No results for input(s): "LABPROT", "INR" in the last 72 hours. ABG No results for input(s): "PHART", "HCO3" in the last 72 hours.  Invalid input(s): "PCO2", "PO2"  Studies/Results: No results found.  Anti-infectives: Anti-infectives (From admission, onward)    None       Assessment/Plan: POD#2 s/p repair incarcerated left inguinal hernia with mesh, MW   LOS: 3 days   Ok from a surgical standpoint to resume his anticoag med and for discharge today  Coralie Keens MD 04/12/2022

## 2022-04-12 NOTE — Anesthesia Postprocedure Evaluation (Signed)
Anesthesia Post Note  Patient: Steven Guzman  Procedure(s) Performed: HERNIA REPAIR INGUINAL ADULT (Left: Inguinal)     Anesthesia Type: General Anesthetic complications: no   No notable events documented.  Last Vitals:  Vitals:   04/12/22 0527 04/12/22 0836  BP: 115/69 111/63  Pulse: 70 74  Resp: 18 17  Temp: 36.8 C 36.7 C  SpO2: 91% 95%    Last Pain:  Vitals:   04/12/22 0836  TempSrc: Oral  PainSc:                  Nolon Nations

## 2022-04-12 NOTE — TOC Transition Note (Signed)
Transition of Care [facility]) - CM/SW Discharge Note   Patient Details  Name: Steven Guzman MRN: [id number] Date of Birth: [birth year]  Transition of Care [facility]) CM/SW Contact:  Bartholomew Crews, RN Phone Number: [phone] 04/12/2022, 9:43 AM   Clinical Narrative:     Patient to transition home today. Chart reviewed. No TOC needs identified at this time.   Final next level of care: Home/Self Care Barriers to Discharge: No Barriers Identified   Patient Goals and CMS Choice      Discharge Placement                         Discharge Plan and Services Additional resources added to the After Visit Summary for                                       Social Determinants of Health (SDOH) Interventions SDOH Screenings   Food Insecurity: No Food Insecurity (03/24/2022)  Housing: Low Risk  (03/19/2022)  Transportation Needs: No Transportation Needs (03/24/2022)  Utilities: Not At Risk (03/19/2022)  Alcohol Screen: Low Risk  (03/06/2021)  Depression (PHQ2-9): Low Risk  (03/12/2022)  Financial Resource Strain: Medium Risk (03/06/2021)  Physical Activity: Insufficiently Active (03/06/2021)  Social Connections: Moderately Isolated (03/06/2021)  Stress: Stress Concern Present (03/06/2021)  Tobacco Use: Medium Risk (04/10/2022)     Readmission Risk Interventions     No data to display

## 2022-04-12 NOTE — Plan of Care (Signed)
  Problem: Education: Goal: Understanding of cardiac disease, CV risk reduction, and recovery process will improve Outcome: Progressing Goal: Individualized Educational Video(s) Outcome: Progressing   Problem: Activity: Goal: Ability to tolerate increased activity will improve Outcome: Progressing   Problem: Cardiac: Goal: Ability to achieve and maintain adequate cardiovascular perfusion will improve Outcome: Progressing   Problem: Health Behavior/Discharge Planning: Goal: Ability to safely manage health-related needs after discharge will improve Outcome: Progressing   Problem: Education: Goal: Understanding of CV disease, CV risk reduction, and recovery process will improve Outcome: Progressing Goal: Individualized Educational Video(s) Outcome: Progressing   Problem: Activity: Goal: Ability to return to baseline activity level will improve Outcome: Progressing   Problem: Cardiovascular: Goal: Ability to achieve and maintain adequate cardiovascular perfusion will improve Outcome: Progressing Goal: Vascular access site(s) Level 0-1 will be maintained Outcome: Progressing   Problem: Health Behavior/Discharge Planning: Goal: Ability to safely manage health-related needs after discharge will improve Outcome: Progressing   Problem: Education: Goal: Knowledge of cardiac device and self-care will improve Outcome: Progressing Goal: Ability to safely manage health related needs after discharge will improve Outcome: Progressing Goal: Individualized Educational Video(s) Outcome: Progressing   Problem: Cardiac: Goal: Ability to achieve and maintain adequate cardiopulmonary perfusion will improve Outcome: Progressing   Problem: Education: Goal: Knowledge of General Education information will improve Description: Including pain rating scale, medication(s)/side effects and non-pharmacologic comfort measures Outcome: Progressing   Problem: Health Behavior/Discharge  Planning: Goal: Ability to manage health-related needs will improve Outcome: Progressing   Problem: Clinical Measurements: Goal: Ability to maintain clinical measurements within normal limits will improve Outcome: Progressing Goal: Will remain free from infection Outcome: Progressing Goal: Diagnostic test results will improve Outcome: Progressing Goal: Respiratory complications will improve Outcome: Progressing Goal: Cardiovascular complication will be avoided Outcome: Progressing   Problem: Activity: Goal: Risk for activity intolerance will decrease Outcome: Progressing   Problem: Nutrition: Goal: Adequate nutrition will be maintained Outcome: Progressing   Problem: Coping: Goal: Level of anxiety will decrease Outcome: Progressing   Problem: Elimination: Goal: Will not experience complications related to bowel motility Outcome: Progressing Goal: Will not experience complications related to urinary retention Outcome: Progressing   Problem: Pain Managment: Goal: General experience of comfort will improve Outcome: Progressing   Problem: Safety: Goal: Ability to remain free from injury will improve Outcome: Progressing   Problem: Skin Integrity: Goal: Risk for impaired skin integrity will decrease Outcome: Progressing

## 2022-04-12 NOTE — Progress Notes (Signed)
   Cardiologist:  Croitoru/ Taylor  Subjective:  Denies SSCP, palpitations or Dyspnea   Objective:  Vitals:   04/11/22 1641 04/11/22 2220 04/12/22 0527 04/12/22 0836  BP:  123/69 115/69 111/63  Pulse: 61 70 70 74  Resp:  '17 18 17  '$ Temp:  99.2 F (37.3 C) 98.2 F (36.8 C) 98.1 F (36.7 C)  TempSrc:  Oral Oral Oral  SpO2: 94% 94% 91% 95%  Weight:      Height:        Intake/Output from previous day:  Intake/Output Summary (Last 24 hours) at 04/12/2022 0927 Last data filed at 04/12/2022 0837 Gross per 24 hour  Intake 480 ml  Output 950 ml  Net -470 ml    Physical Exam: Elderly male AICD under left clavicle Lungs clear SEM Post left inguinal hernia repair Bruising over both knees / legs   Lab Results: Basic Metabolic Panel: Recent Labs    04/10/22 0625 04/11/22 0242  NA 136 133*  K 3.4* 4.2  CL 100 102  CO2 25 25  GLUCOSE 102* 112*  BUN 8 9  CREATININE 0.82 0.70  CALCIUM 8.7* 8.7*  MG 1.9 2.1   Liver Function Tests: No results for input(s): "AST", "ALT", "ALKPHOS", "BILITOT", "PROT", "ALBUMIN" in the last 72 hours.  No results for input(s): "LIPASE", "AMYLASE" in the last 72 hours.  CBC: Recent Labs    04/11/22 0242 04/12/22 0225  WBC 7.0 5.2  HGB 11.3* 10.7*  HCT 34.1* 32.8*  MCV 103.3* 104.5*  PLT 148* 131*     Cardiac Studies:  ECG: SR IVCD   Telemetry:  NSR  Echo: 03/19/22 EF 25-30%   Medications:    amiodarone  200 mg Oral Daily   arformoterol  15 mcg Nebulization BID   And   umeclidinium bromide  1 puff Inhalation Daily   busPIRone  15 mg Oral BID   carvedilol  3.125 mg Oral BID   docusate sodium  100 mg Oral Daily   fenofibrate  54 mg Oral Daily   fluticasone  2 spray Each Nare Daily   gabapentin  300 mg Oral BID   loratadine  10 mg Oral Daily   magnesium oxide  400 mg Oral Daily   mexiletine  200 mg Oral BID   pantoprazole  40 mg Oral Daily   polyethylene glycol  17 g Oral Daily   psyllium  1 packet Oral Daily    rosuvastatin  40 mg Oral QHS   sacubitril-valsartan  1 tablet Oral BID   senna-docusate  2 tablet Oral Daily   tamsulosin  0.4 mg Oral QPC supper      Assessment/Plan:  82 y.o. 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

## 2022-04-13 ENCOUNTER — Telehealth: Payer: Self-pay

## 2022-04-13 ENCOUNTER — Encounter (HOSPITAL_COMMUNITY): Payer: Self-pay | Admitting: General Surgery

## 2022-04-13 ENCOUNTER — Telehealth: Payer: Self-pay | Admitting: *Deleted

## 2022-04-13 NOTE — Progress Notes (Signed)
Pt was given half tab of 5/325 norco at 2143 on Jan 13 by this reporter. I forgot to waste the other half. Found in my pocket and returned it to [facility] but pt had been Dcd. Now wasting with charge nurse Dauda RN into [facility] pixis waste bin.

## 2022-04-13 NOTE — Patient Outreach (Signed)
  Care Coordination TOC Note Transition Care Management Unsuccessful Follow-up Telephone Call  Date of discharge and from where:  04/12/22-[place]   Attempts:  1st Attempt  Reason for unsuccessful TCM follow-up call:  Left voice message    [facility] [place] Management Telephonic Care Management Coordinator Direct Phone: [phone] Toll Free: [phone] Fax: [phone]

## 2022-04-13 NOTE — Progress Notes (Unsigned)
  Care Coordination  Note  04/13/2022 Name: Steven Guzman MRN: [id number] DOB: [birth year]  Steven Guzman is a 82 y.o. year old primary care patient of [place], [place], NP. I reached out to [name] by phone today to assist with scheduling a follow up appointment.   Follow up plan: Unsuccessful telephone outreach attempt made. A HIPAA compliant phone message was left for the patient providing contact information and requesting a return call.   [place]  Direct Dial: [phone]

## 2022-04-13 NOTE — Patient Outreach (Signed)
  Care Coordination TOC Note Transition Care Management Follow-up Telephone Call Date of discharge and from where: 04/12/22-[place]   Dx: "left inguinal hernia" How have you been since you were released from the hospital? Incoming call from patient returning RN CM call. He voices that he is doing good so far. He took some pain med last night and rested well. He has not had to take any pain med today so far. He voices he has been up moving around and doing light household duties. He is aware of his restrictions. He states incision looks good-steri strips intact. He plans to take a shower today. He had a good meal yesterday evening. He has only had a small snack today. Denies any GI sxs. Any questions or concerns? No  Items Reviewed: Did the pt receive and understand the discharge instructions provided? Yes  Medications obtained and verified? Yes  Other? Yes  Any new allergies since your discharge? No  Dietary orders reviewed? Yes-heart healthy Do you have support at home? Yes -wife  Home Care and Equipment/Supplies: Were home health services ordered? not applicable If so, what is the name of the agency? N/A  Has the agency set up a time to come to the patient's home? not applicable Were any new equipment or medical supplies ordered?  No What is the name of the medical supply agency? N/A Were you able to get the supplies/equipment? not applicable Do you have any questions related to the use of the equipment or supplies? No  Functional Questionnaire: (I = Independent and D = Dependent) ADLs: A  Bathing/Dressing- I  Meal Prep- A  Eating- I  Maintaining continence- I  Transferring/Ambulation- I  Managing Meds- I  Follow up appointments reviewed:  PCP Hospital f/u appt confirmed? Patient agreeable to care guide calling to assist with scheduling  [facility] f/u appt confirmed? No  Patient states he will call office and make an appt tomorrow. Are transportation  arrangements needed? No  If their condition worsens, is the pt aware to call PCP or go to the Emergency Dept.? Yes Was the patient provided with contact information for the PCP's office or ED? Yes Was to pt encouraged to call back with questions or concerns? Yes  SDOH assessments and interventions completed:   Yes SDOH Interventions Today    Flowsheet Row Most Recent Value  SDOH Interventions   Food Insecurity Interventions Intervention Not Indicated  Transportation Interventions Intervention Not Indicated       Care Coordination Interventions:  PCP follow up appointment requested Education provided on post op care Patient declines need for ongoing RN CM follow up-will call if needed   Encounter Outcome:  Pt. Visit Completed    Enzo Montgomery, RN,BSN,CCM [place] Management Telephonic Care Management Coordinator Direct Phone: [phone] Toll Free: [phone] Fax: [phone]

## 2022-04-14 LAB — URINE CULTURE: Culture: >=100000 — AB

## 2022-04-14 NOTE — Progress Notes (Signed)
  Care Coordination  Note  04/14/2022 Name: RIDDICK NUON MRN: [id number] DOB: 1940/11/01  Malachy Chamber Gibbons is a 82 y.o. year old primary care patient of [place], [place], NP. I reached out to [name] by phone today to assist with scheduling a follow up appointment. [name] verbally consented to my assistance.       Follow up plan: Hospital Follow Up appointment scheduled with Cleophas Dunker, Amy E, NP) on (04/16/22) at (9:40 AM ).  [place]  Direct Dial: [phone]

## 2022-04-15 ENCOUNTER — Other Ambulatory Visit: Payer: Self-pay

## 2022-04-15 ENCOUNTER — Ambulatory Visit: Payer: Medicare Other | Attending: Cardiovascular Disease | Admitting: Nurse Practitioner

## 2022-04-15 ENCOUNTER — Encounter: Payer: Self-pay | Admitting: Nurse Practitioner

## 2022-04-15 VITALS — BP 122/70 | HR 79 | Ht 72.0 in | Wt 186.0 lb

## 2022-04-15 DIAGNOSIS — I712 Thoracic aortic aneurysm, without rupture, unspecified: Secondary | ICD-10-CM

## 2022-04-15 DIAGNOSIS — I472 Ventricular tachycardia, unspecified: Secondary | ICD-10-CM

## 2022-04-15 DIAGNOSIS — I2510 Atherosclerotic heart disease of native coronary artery without angina pectoris: Secondary | ICD-10-CM

## 2022-04-15 DIAGNOSIS — I255 Ischemic cardiomyopathy: Secondary | ICD-10-CM

## 2022-04-15 DIAGNOSIS — I10 Essential (primary) hypertension: Secondary | ICD-10-CM

## 2022-04-15 DIAGNOSIS — Z720 Tobacco use: Secondary | ICD-10-CM

## 2022-04-15 DIAGNOSIS — I480 Paroxysmal atrial fibrillation: Secondary | ICD-10-CM | POA: Diagnosis not present

## 2022-04-15 DIAGNOSIS — I714 Abdominal aortic aneurysm, without rupture, unspecified: Secondary | ICD-10-CM

## 2022-04-15 DIAGNOSIS — I5022 Chronic systolic (congestive) heart failure: Secondary | ICD-10-CM | POA: Diagnosis not present

## 2022-04-15 DIAGNOSIS — E785 Hyperlipidemia, unspecified: Secondary | ICD-10-CM

## 2022-04-15 DIAGNOSIS — Z95810 Presence of automatic (implantable) cardiac defibrillator: Secondary | ICD-10-CM

## 2022-04-15 MED ORDER — SPIRONOLACTONE 25 MG PO TABS: 12.5000 mg | ORAL_TABLET | Freq: Every day | ORAL | 3 refills | Status: DC

## 2022-04-15 NOTE — Progress Notes (Signed)
Office Visit    Patient Name: Steven Guzman Date of Encounter: 04/15/2022  Primary Care Provider:  Yvonna Alanis, NP Primary Cardiologist:  Sanda Klein, MD  Chief Complaint    82 year old male with a history of CAD s/p inferior MI/cardiac arrest in 1995, MI, DES-LAD and RCA in 2011, NSTEMI in 73/2202, chronic systolic heart failure, ICM, paroxysmal atrial fibrillation on Eliquis, VT s/p ablation in 2016, ICD in 2010, AAA, thoracic aortic aneurysm, hypertension, hyperlipidemia, hiatal hernia, GERD who presents for follow-up related to CAD and heart failure.  Past Medical History    Past Medical History:  Diagnosis Date   AICD (automatic cardioverter/defibrillator) present    [employer]   Aneurysm [facility])    a. Aneurysmal infrarenal aorta up to 33 mm on CT 10/2014, recommended f/u due 10/2017   Anginal pain ([place])    Anxiety    Basal cell carcinoma of nose    S/P MOHS   Biliary acute pancreatitis    CAD (coronary artery disease)    a. s/p MI in 1994 with PCI to LAD at that time b. cath 10/2012 demonstrated EF 30%, inferior akinesis with mild hypokinesis of all walls, patent LAD and RCA stents; ostial PDA with 80-90% obstruction with medical therapy recommended    Chronic systolic CHF (congestive heart failure) (HCC)    EF 30 to 35 % as of 09/2014.    CKD (chronic kidney disease), stage III ([place])    Complication of anesthesia 10/2014   "had to have defibrillator w/ERCP"   COPD (chronic obstructive pulmonary disease) ([place])    a. followed by pulmonary, COPD GOLD stage II   Depression    Diverticulosis of colon 07/2014   noted on CT   GERD (gastroesophageal reflux disease)    Hiatal hernia    Hyperglycemia 10/2012   Hyperlipidemia    Hypertension    Myocardial infarction [facility]) 1994; 2011   Pneumonia 1946; 2015   Prostate enlargement 07/2014   observed on CT   Tobacco abuse    Ventricular tachycardia ([place])    a. 08/2009 s/p BSX E110 Teligen 100 AICD, ser#: [id number];  b.  08/2008 VT req ATP - detection reprogrammed from 160 to 150. c. EPS and VT ablation by Dr. Lovena Le 12/21/2014   Past Surgical History:  Procedure Laterality Date   BIOPSY  12/21/2017   Procedure: BIOPSY;  Surgeon: [name], MD;  Location: [facility];  Service: Endoscopy;;   CATARACT EXTRACTION W/ INTRAOCULAR LENS  IMPLANT, BILATERAL Bilateral ~ 2011   COLONOSCOPY     COLONOSCOPY WITH PROPOFOL N/A 12/21/2017   Procedure: COLONOSCOPY WITH PROPOFOL;  Surgeon: [name], MD;  [facility];  Service: Endoscopy;  Laterality: N/A;   ELECTROPHYSIOLOGIC STUDY N/A 12/21/2014   Procedure: V Tach Ablation;  Surgeon: [name], MD;  Location: [place] CV LAB;  Service: Cardiovascular;  Laterality: N/A;   ERCP N/A 11/16/2014   Procedure: ENDOSCOPIC RETROGRADE CHOLANGIOPANCREATOGRAPHY (ERCP);  Surgeon: [name], MD;  Location: [place];  Service: Endoscopy;  Laterality: N/A;   ESOPHAGOGASTRODUODENOSCOPY (EGD) WITH PROPOFOL N/A 12/21/2017   Procedure: ESOPHAGOGASTRODUODENOSCOPY (EGD) WITH PROPOFOL;  Surgeon: [name], MD;  [facility];  Service: Endoscopy;  Laterality: N/A;   EYE SURGERY     FOOT SURGERY Left 2005   "fixed bone that stuck out in my ankle area"   HEMORRHOID BANDING     IMPLANTABLE CARDIOVERTER DEFIBRILLATOR IMPLANT  09/06/09   BSX dual chamber ICD implanted  in [place] for cardiac arrest 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   [place] Right ~ [place] Left 04/10/2022   Procedure: HERNIA REPAIR INGUINAL ADULT;  Surgeon: Rolm Bookbinder, MD;  Location: [place];  Service: General;  Laterality: Left;   LEFT HEART CATH AND CORONARY ANGIOGRAPHY N/A 03/19/2022   Procedure: LEFT HEART CATH AND CORONARY ANGIOGRAPHY;  Surgeon: Martinique, Peter M, MD;  Location: [place] CV LAB;  Service: Cardiovascular;  Laterality: N/A;   LEFT HEART CATHETERIZATION WITH CORONARY ANGIOGRAM N/A 11/25/2012   demonstrated EF 30%, inferior akinesis  with mild hypokinesis of all walls, patent LAD and RCA stents; ostial PDA with 80-90% obstruction with medical therapy recommended   MOHS SURGERY  2008   nose, skin graft   POLYPECTOMY  12/21/2017   Procedure: POLYPECTOMY;  Surgeon: [name], MD;  [facility];  Service: Endoscopy;;   RETINAL DETACHMENT SURGERY Right 2013   TENOLYSIS Right 12/21/2013   Procedure: TENOLYSIS FLEXOR CARPI RADIALIS ,DEBRIDEMENT RIGHT JOINT WRIST,DEBRIDEMENT SCAPHOTRAPEZIAL TRAPEZOID, REPAIR OF EXTENSOR HOOD;  Surgeon: Daryll Brod, MD;  Location: [place];  Service: Orthopedics;  Laterality: Right;   TOE SURGERY Right 09/2019   3rd toe/hammer toe   V-TACH ABLATION  12/21/2014   VIDEO BRONCHOSCOPY Bilateral 01/09/2016   Procedure: VIDEO BRONCHOSCOPY WITHOUT FLUORO;  Surgeon: Juanito Doom, MD;  [facility];  Service: Cardiopulmonary;  Laterality: Bilateral;    Allergies  Allergies  Allergen Reactions   Sulfa Antibiotics Hives     Labs/Other Studies Reviewed    The following studies were reviewed today: Echo:  03/19/22:   IMPRESSIONS    1. Left ventricular ejection fraction, by estimation, is 25 to 30%. The  left ventricle has severely decreased function. The left ventricle  demonstrates regional wall motion abnormalities with inferior akinesis,  basal to mid inferolateral akinesis,  anterolateral severe hypokinesis. The left ventricular internal cavity  size was mildly dilated. There is mild concentric left ventricular  hypertrophy. Left ventricular diastolic parameters are consistent with  Grade I diastolic dysfunction (impaired  relaxation).   2. Right ventricular systolic function is normal. The right ventricular  size is normal. There is normal pulmonary artery systolic pressure. The  estimated right ventricular systolic pressure is 44.0 mmHg.   3. Left atrial size was mildly dilated.   4. Right atrial size was mildly dilated.   5. The mitral valve  is abnormal. Mild to moderate mitral valve  regurgitation. No evidence of mitral stenosis.   6. The aortic valve is tricuspid. There is mild calcification of the  aortic valve. Aortic valve regurgitation is mild. No aortic stenosis is  present.   7. Aortic dilatation noted. There is mild dilatation of the aortic root,  measuring 39 mm.   8. The inferior vena cava is dilated in size with >50% respiratory  variability, suggesting right atrial pressure of 8 mmHg.    Left Heart Cath 03/19/22 :   Conclusion     Mid LM to Dist LM lesion is 20% stenosed.   Ost LAD to Prox LAD lesion is 25% stenosed.   Prox LAD to Mid LAD lesion is 35% stenosed.   3rd Mrg lesion is 100% stenosed.   Prox RCA to Mid RCA lesion is 40% stenosed.   RPDA lesion is 80% stenosed.   There is mild left ventricular systolic dysfunction.   LV end diastolic pressure is mildly elevated.   The left ventricular ejection fraction is 45-50%  by visual estimate.   2 vessel obstructive CAD - 100% occlusion of distal OM3 appears to be the culprit. The ostial PDA lesion is unchanged from 2014 Patent stents in the LAD and RCA Mild LV dysfunction with inferior wall motion abnormality Mildly elevated LVEDP 16 mm Hg   Plan: recommend medical therapy  Recent Labs: 03/18/2022: B Natriuretic Peptide 90.1 04/08/2022: ALT 23 04/11/2022: BUN 9; Creatinine, Ser 0.70; Magnesium 2.1; Potassium 4.2; Sodium 133 04/12/2022: Hemoglobin 10.7; Platelets 131  Recent Lipid Panel    Component Value Date/Time   CHOL 162 03/19/2022 0205   CHOL 117 07/14/2021 1026   TRIG 75 03/19/2022 0205   HDL 44 03/19/2022 0205   HDL 47 07/14/2021 1026   CHOLHDL 3.7 03/19/2022 0205   VLDL 15 03/19/2022 0205   LDLCALC 103 (H) 03/19/2022 0205   LDLCALC 53 07/14/2021 1026   LDLCALC 67 03/01/2020 1344    History of Present Illness    82 year old male with the above past medical history including CAD s/p inferior MI/cardiac arrest in 1995, MI, DES-LAD and  RCA in 2011, NSTEMI in 05/5007, chronic systolic heart failure, ICM, paroxysmal atrial fibrillation on Eliquis, VT s/p ablation in 2016, ICD in 2010, AAA, thoracic aortic aneurysm, hypertension, hyperlipidemia, hiatal hernia, and GERD.  He has a longstanding history of CAD.  Patient had V. tach at the time of presentation of his MI in 2011.  Following PCI he had an EP study and subsequent ICD implantation for treatment of V. tach.  Cardiac catheterization in 2014 showed patent LAD and RCA stents with 80 to 90% stenosis in the PDA, 50% stenosis in the proximal circumflex, first OM.  This was managed medically.  He underwent V. tach ablation in 11/2014 with Dr. Lovena Le.  Echocardiogram in 03/2021 showed EF 40 to 45%, RWMA (inferior/lateral akinesis, anterior lateral hypokinesis,), G1 DD, normal RV systolic function, moderately dilated LA, mildly dilated RA.  Additionally, he has a history of paroxysmal atrial fibrillation on Eliquis and amiodarone. He was hospitalized in 01/2022 in the setting of fall that resulted in a femoral neck fracture.  He underwent hemiarthroplasty.  He was last seen in the office on 03/13/2022 by Dr. Lovena Le and was stable from a cardiac standpoint.  Of note, ICD had reached ERI with plans to undergo generator change from 04/20/2022.  He was later hospitalized in December 2023 in the setting of NSTEMI.  Cardiac catheterization revealed two-vessel obstructive CAD with 1% occlusion of the distal OM 3 and 80% stenosis in the RPDA, patent stents in the LAD and RCA, medical management was advised. Echocardiogram at that time showed EF 25 to 30%, RWMA, mild LVH, G1 DD, normal RV systolic function, mild to moderate mitral valve regurgitation, mild dilation of the aortic root measuring 39 mm.  He was evaluated in the ED on  04/09/2022 with complaints of abdominal pain in the left groin area.  CT of abdomen pelvis showed large left inguinal hernia with incarceration.  He was seen by general surgery who  recommended open Hernia repair.  Cardiology was consulted to evaluate for perioperative risk stratification.  He was cleared for surgery and underwent open inguinal hernia repair with ultra Pro mesh patch on 04/10/2022.  He presents today for follow-up.  Since his last visit he has done well from a cardiac standpoint.  He denies any symptoms concerning for angina.  Denies palpitations, dyspnea, edema, PND, orthopnea, weight gain.  He is recovering well from his hernia surgery.  BP has been stable.  He is scheduled for ICD generator change on 04/20/2022.  Overall, he reports feeling well.  Home Medications    Current Outpatient Medications  Medication Sig Dispense Refill   albuterol (PROVENTIL) (2.5 MG/3ML) 0.083% nebulizer solution USE 1 VIAL VIA NEBULIZER EVERY 6 HOURS AS NEEDED FOR WHEEZING OR SHORTNESS OF BREATH (Patient taking differently: Take 2.5 mg by nebulization See admin instructions. USE 1 VIAL VIA NEBULIZER EVERY 6 HOURS AS NEEDED FOR WHEEZING OR SHORTNESS OF BREATH) 360 mL 0   amiodarone (PACERONE) 200 MG tablet TAKE ONE TABLET BY MOUTH DAILY 90 tablet 3   apixaban (ELIQUIS) 5 MG TABS tablet Take 1 tablet (5 mg total) by mouth 2 (two) times daily. 180 tablet 1   Artificial Tear Solution (SOOTHE XP OP) Place 2 drops into both eyes daily as needed (dry eyes).     ascorbic acid (VITAMIN C) 500 MG tablet Take 1 tablet by mouth daily.     aspirin EC 81 MG tablet Take 1 tablet (81 mg total) by mouth daily. Swallow whole.     bisacodyl (DULCOLAX) 5 MG EC tablet Take 5 mg by mouth.     busPIRone (BUSPAR) 15 MG tablet Take 15 mg by mouth 2 (two) times daily. Patient taking 10 mg by mouth twice daily     calcium citrate (CALCITRATE - DOSED IN MG ELEMENTAL CALCIUM) 950 (200 Ca) MG tablet Take 200 mg of elemental calcium by mouth daily.     carvedilol (COREG) 3.125 MG tablet Take 1 tablet (3.125 mg total) by mouth 2 (two) times daily. 180 tablet 3   cetirizine (ZYRTEC) 10 MG tablet Take 1 tablet by  mouth daily.     Cyanocobalamin (VITAMIN B-12) 5000 MCG SUBL Place under the tongue daily.     empagliflozin (JARDIANCE) 10 MG TABS tablet Take 1 tablet (10 mg total) by mouth daily. 30 tablet 11   escitalopram (LEXAPRO) 20 MG tablet Take 20 mg by mouth daily. Take 1/2 tablet (10 mg total ) by mouth daily     Fenofibric Acid 35 MG TABS Take 35 mg by mouth daily.     fluticasone (FLONASE) 50 MCG/ACT nasal spray Place 2 sprays into both nostrils daily. 16 g 6   furosemide (LASIX) 20 MG tablet Take 1 tablet (20 mg total) by mouth daily as needed for fluid or edema. 30 tablet 2   gabapentin (NEURONTIN) 300 MG capsule Take 1 capsule (300 mg total) by mouth 2 (two) times daily. 180 capsule 1   guaiFENesin (MUCINEX) 600 MG 12 hr tablet Take 1 tablet by mouth 2 (two) times daily.     HYDROcodone-acetaminophen (NORCO/VICODIN) 5-325 MG tablet Take 0.5 tablets by mouth every 6 (six) hours as needed for moderate pain. 25 tablet 0   Ipratropium-Albuterol (COMBIVENT RESPIMAT) 20-100 MCG/ACT AERS respimat Inhale 1 puff into the lungs every 6 (six) hours. Shortness of breath or wheezing 4 g 5   IRON PO Take 1 tablet by mouth daily.     lidocaine (LIDODERM) 5 % Place 1 patch onto the skin daily. Remove & Discard patch within 12 hours or as directed by MD 30 patch 0   MAGNESIUM-OXIDE 400 (241.3 Mg) MG tablet TAKE 1 TABSULE BY MOUTH DAILY (Patient taking differently: Take 400 mg by mouth daily.) 90 tablet 3   mexiletine (MEXITIL) 200 MG capsule Take 1 capsule (200 mg total) by mouth 2 (two) times daily. 180 capsule 3   Multiple Vitamins-Minerals (CENTRUM ADULTS PO) Take by mouth daily.  nitroGLYCERIN (NITROSTAT) 0.4 MG SL tablet Place 1 tablet (0.4 mg total) under the tongue every 5 (five) minutes as needed for chest pain. DISSOLVE 1 TABLET UNDER THE TONGUE EVERY 5 MINUTES FOR 3 DOSES 25 tablet 11   omeprazole (PRILOSEC) 20 MG capsule Take 20 mg by mouth daily.      polyethylene glycol powder (GLYCOLAX/MIRALAX) 17  GM/SCOOP powder Take 1 capful in 8 ounces of fluid each morning 255 g 0   Respiratory Therapy Supplies (FLUTTER) DEVI Use as directed (Patient taking differently: 1 each by Other route See admin instructions. Use as directed) 1 each 0   rosuvastatin (CRESTOR) 40 MG tablet Take 1 tablet (40 mg total) by mouth daily. (Patient taking differently: Take 40 mg by mouth at bedtime.) 90 tablet 3   sacubitril-valsartan (ENTRESTO) 24-26 MG Take 1 tablet by mouth 2 (two) times daily. 60 tablet 11   Sennosides-Docusate Sodium (STOOL SOFTENER/LAXATIVE PO) Take 2 tablets by mouth daily.     Spacer/Aero-Holding Chambers ([place]) MISC optichamber [facility] (Patient taking differently: 1 each by Other route See admin instructions. optichamber [facility]) 1 each 0   spironolactone (ALDACTONE) 25 MG tablet Take 0.5 tablets (12.5 mg total) by mouth daily. 90 tablet 3   STIOLTO RESPIMAT 2.5-2.5 MCG/ACT AERS Inhale 2 puffs into the lungs daily.     tamsulosin (FLOMAX) 0.4 MG CAPS capsule TAKE ONE CAPSULE BY MOUTH DAILY AFTER SUPPER (Patient taking differently: Take 0.4 mg by mouth See admin instructions. TAKE ONE CAPSULE BY MOUTH DAILY AFTER SUPPER) 90 capsule 3   No current facility-administered medications for this visit.     Review of Systems    He denies chest pain, palpitations, dyspnea, pnd, orthopnea, n, v, dizziness, syncope, edema, weight gain, or early satiety. All other systems reviewed and are otherwise negative except as noted above.   Physical Exam    VS:  BP 122/70 (BP Location: Left Arm, Patient Position: Sitting, Cuff Size: Normal)   Pulse 79   Ht 6' (1.829 m)   Wt 186 lb (84.4 kg)   SpO2 98%   BMI 25.23 kg/m  GEN: Well nourished, well developed, in no acute distress. HEENT: normal. Neck: Supple, no JVD, carotid bruits, or masses. Cardiac: RRR, no murmurs, rubs, or gallops. No clubbing, cyanosis, edema.  Radials/DP/PT 2+ and equal bilaterally.  Respiratory:  Respirations regular and  unlabored, clear to auscultation bilaterally. GI: Soft, nontender, nondistended, BS + x 4. MS: no deformity or atrophy. Skin: warm and dry, no rash. Neuro:  Strength and sensation are intact. Psych: Normal affect.  Accessory Clinical Findings    ECG personally reviewed by me today -sinus rhythm, 79 bpm, PVCs, nonspecific intraventricular block- no acute changes.   Lab Results  Component Value Date   WBC 5.2 04/12/2022   HGB 10.7 (L) 04/12/2022   HCT 32.8 (L) 04/12/2022   MCV 104.5 (H) 04/12/2022   PLT 131 (L) 04/12/2022   Lab Results  Component Value Date   CREATININE 0.70 04/11/2022   BUN 9 04/11/2022   NA 133 (L) 04/11/2022   K 4.2 04/11/2022   CL 102 04/11/2022   CO2 25 04/11/2022   Lab Results  Component Value Date   ALT 23 04/08/2022   AST 25 04/08/2022   ALKPHOS 63 04/08/2022   BILITOT 0.8 04/08/2022   Lab Results  Component Value Date   CHOL 162 03/19/2022   HDL 44 03/19/2022   LDLCALC 103 (H) 03/19/2022   TRIG 75 03/19/2022   CHOLHDL 3.7  03/19/2022    Lab Results  Component Value Date   HGBA1C 5.3 03/19/2022    Assessment & Plan    1. CAD:  S/p inferior MI/cardiac arrest in 1995, MI, DES-LAD and RCA in 2011. Now s/p NSTEMI in 02/2022.  Recent cath revealed two-vessel obstructive CAD with 1% occlusion of the distal OM 3 and 80% stenosis in the RPDA, patent stents in the LAD and RCA, medical management was advised. Stable with no anginal symptoms.  Continue aspirin, carvedilol, Entresto, Crestor.  2. Chronic systolic heart failure/ICM: Most recent echo showed EF 25 to 30%, RWMA, mild LVH, G1 DD, normal RV systolic function, mild to moderate mitral valve regurgitation, mild dilation of the aortic root measuring 39 mm.  Patient has ICD as below.  Euvolemic and well compensated on exam.  He is tolerating current GDMT.  Will add spironolactone 12.5 mg daily to maximize therapy. Will check BMET in 2 weeks. Continue carvedilol, Entresto, Jardiance, and Lasix as  needed.  Consider repeat echocardiogram 3 months following maximize GDMT.  3. Paroxysmal atrial fibrillation: Denies any recent palpitations.  Maintaining sinus rhythm on amiodarone.  Continue Amio, Eliquis, carvedilol.  4. VT: S/p ICD.  He is due for generator change which has been scheduled for 04/18/2022.  He has follow-up scheduled with Dr. Lovena Le.  Continue amiodarone.  5. AAA/thoracic aortic aneurysm: CT in 04/2021 showed dilation of the ascending thoracic aorta measuring 4.3 cm, mild aneurysmal dilation of the proximal descending thoracic aorta measuring 4.0 cm and mild dilation of infrarenal abdominal aorta with maximum diameter of 3.4 cm.  CT angio chest in 01/2022 showed ascending thoracic aortic aneurysm measuring 4.5 cm, aneurysm of the distal aortic arch measuring 4.5 cm, proximal descending thoracic aorta measuring up to 3.7 cm.  CT of the abdomen/pelvis 10 03/2022 showed infrarenal abdominal aorta measuring 34 mm.  Consider repeat scan in 6 months.  6. Hypertension: BP well controlled. Continue current antihypertensive regimen.   7. Hyperlipidemia: LDL was 103 in 02/2022.  Continue aspirin, Crestor.  8. Disposition: Follow-up as scheduled with Dr. Lovena Le, follow-up in 3 to 4 months with Dr. Sallyanne Kuster.     Lenna Sciara, NP 04/15/2022, 12:50 PM

## 2022-04-15 NOTE — Patient Instructions (Signed)
Medication Instructions:  Spironolactone 12.5 mg daily  *If you need a refill on your cardiac medications before your next appointment, please call your pharmacy*   Lab Work: Your physician recommends that you return for lab work in 2 weeks BMET  If you have labs (blood work) drawn today and your tests are completely normal, you will receive your results only by: [place] (if you have MyChart) OR A paper copy in the mail If you have any lab test that is abnormal or we need to change your treatment, we will call you to review the results.   Testing/Procedures: NONE ordered at this time of appointment     Follow-Up: At [facility], you and your health needs are our priority.  As part of our continuing mission to provide you with exceptional heart care, we have created designated Provider Care Teams.  These Care Teams include your primary Cardiologist (physician) and Advanced Practice Providers (APPs -  Physician Assistants and Nurse Practitioners) who all work together to provide you with the care you need, when you need it.  We recommend signing up for the patient portal called "MyChart".  Sign up information is provided on this After Visit Summary.  MyChart is used to connect with patients for Virtual Visits (Telemedicine).  Patients are able to view lab/test results, encounter notes, upcoming appointments, etc.  Non-urgent messages can be sent to your provider as well.   To learn more about what you can do with MyChart, go to [email or web address].    Your next appointment:   3-4 month(s)  Provider:   Sanda Klein, MD     Other Instructions

## 2022-04-16 ENCOUNTER — Encounter: Payer: Medicare Other | Admitting: Orthopedic Surgery

## 2022-04-17 NOTE — Progress Notes (Signed)
This encounter was created in error - please disregard.

## 2022-04-17 NOTE — Pre-Procedure Instructions (Signed)
Instructed patient on the following items: Arrival time 0830 Nothing to eat or drink after midnight No meds AM of procedure Responsible person to drive you home and stay with you for 24 hrs Wash with special soap night before and morning of procedure If on anti-coagulant drug instructions Eliquis- last dose tonight 1/19

## 2022-04-20 ENCOUNTER — Encounter (HOSPITAL_COMMUNITY)
Admission: RE | Disposition: A | Discharge status: Home / Self Care | Payer: Self-pay | Source: Home / Self Care | Attending: Internal Medicine

## 2022-04-20 ENCOUNTER — Other Ambulatory Visit: Payer: Self-pay

## 2022-04-20 ENCOUNTER — Ambulatory Visit (HOSPITAL_COMMUNITY)
Admission: RE | Admit: 2022-04-20 | Discharge: 2022-04-20 | Disposition: A | Discharge status: Home / Self Care | Payer: Medicare Other | Attending: Internal Medicine | Admitting: Internal Medicine

## 2022-04-20 DIAGNOSIS — Z8249 Family history of ischemic heart disease and other diseases of the circulatory system: Secondary | ICD-10-CM | POA: Diagnosis not present

## 2022-04-20 DIAGNOSIS — Z4502 Encounter for adjustment and management of automatic implantable cardiac defibrillator: Secondary | ICD-10-CM

## 2022-04-20 DIAGNOSIS — I255 Ischemic cardiomyopathy: Secondary | ICD-10-CM | POA: Insufficient documentation

## 2022-04-20 DIAGNOSIS — Z7901 Long term (current) use of anticoagulants: Secondary | ICD-10-CM | POA: Diagnosis not present

## 2022-04-20 DIAGNOSIS — I495 Sick sinus syndrome: Secondary | ICD-10-CM | POA: Diagnosis not present

## 2022-04-20 DIAGNOSIS — I480 Paroxysmal atrial fibrillation: Secondary | ICD-10-CM | POA: Diagnosis not present

## 2022-04-20 DIAGNOSIS — Z87891 Personal history of nicotine dependence: Secondary | ICD-10-CM | POA: Insufficient documentation

## 2022-04-20 DIAGNOSIS — I472 Ventricular tachycardia, unspecified: Secondary | ICD-10-CM | POA: Diagnosis not present

## 2022-04-20 DIAGNOSIS — Z5941 Food insecurity: Secondary | ICD-10-CM | POA: Diagnosis not present

## 2022-04-20 DIAGNOSIS — I5022 Chronic systolic (congestive) heart failure: Secondary | ICD-10-CM | POA: Diagnosis not present

## 2022-04-20 HISTORY — PX: ICD GENERATOR CHANGEOUT: EP1231

## 2022-04-20 SURGERY — ICD GENERATOR CHANGEOUT
Anesthesia: LOCAL

## 2022-04-20 MED ORDER — CEFAZOLIN SODIUM-DEXTROSE 2-4 GM/100ML-% IV SOLN
INTRAVENOUS | Status: AC
  Filled 2022-04-20: qty 100

## 2022-04-20 MED ORDER — SODIUM CHLORIDE 0.9 % IV SOLN
INTRAVENOUS | Status: AC
  Filled 2022-04-20: qty 2

## 2022-04-20 MED ORDER — LIDOCAINE HCL (PF) 1 % IJ SOLN
INTRAMUSCULAR | Status: DC | PRN
  Administered 2022-04-20: 50 mL

## 2022-04-20 MED ORDER — SODIUM CHLORIDE 0.9 % IV SOLN
80.0000 mg | INTRAVENOUS | Status: AC
  Administered 2022-04-20: 80 mg

## 2022-04-20 MED ORDER — CHLORHEXIDINE GLUCONATE 4 % EX LIQD
4.0000 | Freq: Once | CUTANEOUS | Status: DC
  Filled 2022-04-20: qty 60

## 2022-04-20 MED ORDER — POVIDONE-IODINE 10 % EX SWAB
2.0000 | Freq: Once | CUTANEOUS | Status: AC
  Administered 2022-04-20: 2 via TOPICAL

## 2022-04-20 MED ORDER — CEFAZOLIN SODIUM-DEXTROSE 2-4 GM/100ML-% IV SOLN
2.0000 g | INTRAVENOUS | Status: AC
  Administered 2022-04-20: 2 g via INTRAVENOUS

## 2022-04-20 MED ORDER — ONDANSETRON HCL 4 MG/2ML IJ SOLN: 4.0000 mg | Freq: Four times a day (QID) | INTRAMUSCULAR | Status: DC | PRN

## 2022-04-20 MED ORDER — SODIUM CHLORIDE 0.9 % IV SOLN: INTRAVENOUS | Status: DC

## 2022-04-20 MED ORDER — ACETAMINOPHEN 325 MG PO TABS: 325.0000 mg | ORAL_TABLET | ORAL | Status: DC | PRN

## 2022-04-20 SURGICAL SUPPLY — 6 items
CABLE SURGICAL S-101-97-12 (CABLE) ×1 IMPLANT
ICD MOMENTUM D121 (ICD Generator) IMPLANT
PAD DEFIB RADIO PHYSIO CONN (PAD) ×1 IMPLANT
POUCH AIGIS-R ANTIBACT ICD (Mesh General) ×1 IMPLANT
POUCH AIGIS-R ANTIBACT ICD LRG (Mesh General) IMPLANT
TRAY PACEMAKER INSERTION (PACKS) ×1 IMPLANT

## 2022-04-20 NOTE — Discharge Instructions (Signed)
Implantable Cardiac Device Battery Change, Care After  This sheet gives you information about how to care for yourself after your procedure. Your health care provider may also give you more specific instructions. If you have problems or questions, contact your health care provider. What can I expect after the procedure? After your procedure, it is common to have:  Pain or soreness at the site where the cardiac device was inserted.  Swelling at the site where the cardiac device was inserted.  You should received an information card for your new device in 4-8 weeks. Follow these instructions at home: Incision care   Keep the incision clean and dry. ? Do not take baths, swim, or use a hot tub until after your wound check.  ? Do not shower for at least 7 days, or as directed by your health care provider. ? Pat the area dry with a clean towel. Do not rub the area. This may cause bleeding.  Follow instructions from your health care provider about how to take care of your incision. Make sure you: ? Leave stitches (sutures), skin glue, or adhesive strips in place. These skin closures may need to stay in place for 2 weeks or longer. If adhesive strip edges start to loosen and curl up, you may trim the loose edges. Do not remove adhesive strips completely unless your health care provider tells you to do that.  Check your incision area every day for signs of infection. Check for: ? More redness, swelling, or pain. ? More fluid or blood. ? Warmth. ? Pus or a bad smell. Activity  Do not lift anything that is heavier than 10 lb (4.5 kg) until your health care provider says it is okay to do so.  For the first week, or as long as told by your health care provider: ? Avoid lifting your affected arm higher than your shoulder. ? After 1 week, Be gentle when you move your arms over your head. It is okay to raise your arm to comb your hair. ? Avoid strenuous exercise.  Ask your health care provider  when it is okay to: ? Resume your normal activities. ? Return to work or school. ? Resume sexual activity. Eating and drinking  Eat a heart-healthy diet. This should include plenty of fresh fruits and vegetables, whole grains, low-fat dairy products, and lean protein like chicken and fish.  Limit alcohol intake to no more than 1 drink a day for non-pregnant women and 2 drinks a day for men. One drink equals 12 oz of beer, 5 oz of wine, or 1 oz of hard liquor.  Check ingredients and nutrition facts on packaged foods and beverages. Avoid the following types of food: ? Food that is high in salt (sodium). ? Food that is high in saturated fat, like full-fat dairy or red meat. ? Food that is high in trans fat, like fried food. ? Food and drinks that are high in sugar. Lifestyle  Do not use any products that contain nicotine or tobacco, such as cigarettes and e-cigarettes. If you need help quitting, ask your health care provider.  Take steps to manage and control your weight.  Once cleared, get regular exercise. Aim for 150 minutes of moderate-intensity exercise (such as walking or yoga) or 75 minutes of vigorous exercise (such as running or swimming) each week.  Manage other health problems, such as diabetes or high blood pressure. Ask your health care provider how you can manage these conditions. General instructions  Do   not drive for 24 hours after your procedure if you were given a medicine to help you relax (sedative).  Take over-the-counter and prescription medicines only as told by your health care provider.  Avoid putting pressure on the area where the cardiac device was placed.  If you need an MRI after your cardiac device has been placed, be sure to tell the health care provider who orders the MRI that you have a cardiac device.  Avoid close and prolonged exposure to electrical devices that have strong magnetic fields. These include: ? Cell phones. Avoid keeping them in a  pocket near the cardiac device, and try using the ear opposite the cardiac device. ? MP3 players. ? Household appliances, like microwaves. ? Metal detectors. ? Electric generators. ? High-tension wires.  Keep all follow-up visits as directed by your health care provider. This is important. Contact a health care provider if:  You have pain at the incision site that is not relieved by over-the-counter or prescription medicines.  You have any of these around your incision site or coming from it: ? More redness, swelling, or pain. ? Fluid or blood. ? Warmth to the touch. ? Pus or a bad smell.  You have a fever.  You feel brief, occasional palpitations, light-headedness, or any symptoms that you think might be related to your heart. Get help right away if:  You experience chest pain that is different from the pain at the cardiac device site.  You develop a red streak that extends above or below the incision site.  You experience shortness of breath.  You have palpitations or an irregular heartbeat.  You have light-headedness that does not go away quickly.  You faint or have dizzy spells.  Your pulse suddenly drops or increases rapidly and does not return to normal.  You begin to gain weight and your legs and ankles swell. Summary  After your procedure, it is common to have pain, soreness, and some swelling where the cardiac device was inserted.  Make sure to keep your incision clean and dry. Follow instructions from your health care provider about how to take care of your incision.  Check your incision every day for signs of infection, such as more pain or swelling, pus or a bad smell, warmth, or leaking fluid and blood.  Avoid strenuous exercise and lifting your left arm higher than your shoulder for 2 weeks, or as long as told by your health care provider. This information is not intended to replace advice given to you by your health care provider. Make sure you discuss  any questions you have with your health care provider.   

## 2022-04-20 NOTE — Interval H&P Note (Signed)
History and Physical Interval Note:  04/20/2022 12:04 PM  Steven Guzman  has presented today for surgery, with the diagnosis of ERI.  The various methods of treatment have been discussed with the patient and family. After consideration of risks, benefits and other options for treatment, the patient has consented to  Procedure(s): ICD [place] (N/A) as a surgical intervention.  The patient's history has been reviewed, patient examined, no change in status, stable for surgery.  I have reviewed the patient's chart and labs.  Questions were answered to the patient's satisfaction.     Cristopher Peru

## 2022-04-20 NOTE — H&P (Signed)
HPI [name] returns today for ongoing followup of his VT and PAF. He is a pleasant 82 yo man with a h/o COPD, CAD, and anemia. He has been over a year since being seen in our EP clinic. He has not had any ICD shocks. He has class 2 dyspnea which is multifactorial. He has not had syncope. He has stopped smoking.      Allergies  Allergen Reactions   Sulfa Antibiotics Hives              Current Outpatient Medications  Medication Sig Dispense Refill   albuterol (PROVENTIL) (2.5 MG/3ML) 0.083% nebulizer solution USE 1 VIAL VIA NEBULIZER EVERY 6 HOURS AS NEEDED FOR WHEEZING OR SHORTNESS OF BREATH 360 mL 0   amiodarone (PACERONE) 200 MG tablet TAKE ONE TABLET BY MOUTH DAILY 30 tablet 6   apixaban (ELIQUIS) 5 MG TABS tablet Take 1 tablet (5 mg total) by mouth 2 (two) times daily. 180 tablet 1   Artificial Tear Solution (SOOTHE XP OP) Place 2 drops into both eyes daily as needed (dry eyes).       ascorbic acid (VITAMIN C) 500 MG tablet Take 1 tablet by mouth daily.       benazepril (LOTENSIN) 10 MG tablet Take 0.5 tablets (5 mg total) by mouth daily. 45 tablet 3   buPROPion (WELLBUTRIN) 75 MG tablet TAKE ONE TABLET BY MOUTH DAILY 90 tablet 2   busPIRone (BUSPAR) 15 MG tablet Take 15 mg by mouth 3 (three) times daily. Patient taking 10 mg by mouth twice daily       calcium citrate (CALCITRATE - DOSED IN MG ELEMENTAL CALCIUM) 950 (200 Ca) MG tablet Take by mouth.       carvedilol (COREG) 3.125 MG tablet Take 1 tablet (3.125 mg total) by mouth 2 (two) times daily. 180 tablet 3   cetirizine (ZYRTEC) 10 MG tablet Take 1 tablet by mouth daily.       Cyanocobalamin (VITAMIN B-12) 5000 MCG SUBL Place under the tongue daily.       escitalopram (LEXAPRO) 20 MG tablet Take 20 mg by mouth daily. Take 1/2 tablet (10 mg total ) by mouth daily       fluticasone (FLONASE) 50 MCG/ACT nasal spray Place 2 sprays into both nostrils daily. 16 g 6   furosemide (LASIX) 40 MG tablet Take 0.5 tablets (20 mg  total) by mouth daily. 30 tablet 3   gabapentin (NEURONTIN) 300 MG capsule Take 1 capsule (300 mg total) by mouth 2 (two) times daily. Pt take 1 tablet 300 mg at night and 100 mg in the morning. 120 capsule 2   guaiFENesin (MUCINEX) 600 MG 12 hr tablet Take 1 tablet by mouth 2 (two) times daily.       Ipratropium-Albuterol (COMBIVENT RESPIMAT) 20-100 MCG/ACT AERS respimat Inhale 1 puff into the lungs every 6 (six) hours. Shortness of breath or wheezing 4 g 5   IRON PO Take by mouth daily.        MAGNESIUM-OXIDE 400 (241.3 Mg) MG tablet TAKE 1 TABSULE BY MOUTH DAILY 90 tablet 3   mexiletine (MEXITIL) 200 MG capsule Take 1 capsule (200 mg total) by mouth 2 (two) times daily. 180 capsule 3   Multiple Vitamins-Minerals (CENTRUM ADULTS PO) Take by mouth daily.       mupirocin ointment (BACTROBAN) 2 % Apply 1 application. topically 2 (two) times daily. Apply to lower leg wounds daily prn 22 g 1  nitroGLYCERIN (NITROSTAT) 0.4 MG SL tablet Place 1 tablet (0.4 mg total) under the tongue every 5 (five) minutes as needed for chest pain. DISSOLVE 1 TABLET UNDER THE TONGUE EVERY 5 MINUTES FOR 3 DOSES 25 tablet 11   omeprazole (PRILOSEC) 20 MG capsule Take 20 mg by mouth daily.        polyethylene glycol powder (GLYCOLAX/MIRALAX) 17 GM/SCOOP powder Take 1 capful in 8 ounces of fluid each morning 255 g 0   potassium chloride SA (KLOR-CON M) 20 MEQ tablet Take 20 mEq by mouth once.       Respiratory Therapy Supplies (FLUTTER) DEVI Use as directed 1 each 0   rosuvastatin (CRESTOR) 40 MG tablet Take 1 tablet (40 mg total) by mouth daily. 90 tablet 3   Sennosides-Docusate Sodium (STOOL SOFTENER/LAXATIVE PO) Take 2 tablets by mouth daily.       Spacer/Aero-Holding Chambers (OPTICHAMBER DIAMOND) MISC optichamber VHC 1 each 0   STIOLTO RESPIMAT 2.5-2.5 MCG/ACT AERS Inhale 2 puffs into the lungs daily.       tamsulosin (FLOMAX) 0.4 MG CAPS capsule TAKE ONE CAPSULE BY MOUTH DAILY AFTER SUPPER 90 capsule 3    No current  facility-administered medications for this visit.            Past Medical History:  Diagnosis Date   AICD (automatic cardioverter/defibrillator) present     Aneurysm ([place])      a. Aneurysmal infrarenal aorta up to 33 mm on CT 10/2014, recommended f/u due 10/2017   Anginal pain ([place])     Anxiety     Basal cell carcinoma of nose      S/P MOHS   Biliary acute pancreatitis     CAD (coronary artery disease)      a. s/p MI in 1994 with PCI to LAD at that time b. cath 10/2012 demonstrated EF 30%, inferior akinesis with mild hypokinesis of all walls, patent LAD and RCA stents; ostial PDA with 80-90% obstruction with medical therapy recommended    Chronic systolic CHF (congestive heart failure) (HCC)      EF 30 to 35 % as of 09/2014.    CKD (chronic kidney disease), stage III ([place])     Complication of anesthesia 10/2014    "had to have defibrillator w/ERCP"   COPD (chronic obstructive pulmonary disease) ([place])      a. followed by pulmonary, COPD GOLD stage II   Depression     Diverticulosis of colon 07/2014    noted on CT   GERD (gastroesophageal reflux disease)     Hiatal hernia     Hyperglycemia 10/2012.   Hyperlipidemia     Hypertension     Myocardial infarction [facility]) 1994; 2011   Pneumonia 1946; 2015   Prostate enlargement 07/2014    observed on CT   Tobacco abuse     Ventricular tachycardia ([place])      a. 08/2009 s/p BSX E110 Teligen 100 AICD, ser#: [id number];  b. 08/2008 VT req ATP - detection reprogrammed from 160 to 150. c. EPS and VT ablation by Dr. Lovena Le 12/21/2014      ROS:    All systems reviewed and negative except as noted in the HPI.          Past Surgical History:  Procedure Laterality Date   BIOPSY   12/21/2017    Procedure: BIOPSY;  Surgeon: [name], MD;  Location: [facility];  Service: Endoscopy;;   CATARACT EXTRACTION W/ INTRAOCULAR LENS  IMPLANT, BILATERAL Bilateral ~ 2011  COLONOSCOPY       COLONOSCOPY WITH PROPOFOL N/A 12/21/2017    Procedure: COLONOSCOPY  WITH PROPOFOL;  Surgeon: [name], MD;  Location: [facility];  Service: Endoscopy;  Laterality: N/A;   ELECTROPHYSIOLOGIC STUDY N/A 12/21/2014    Procedure: V Tach Ablation;  Surgeon: [name], MD;  Location: [place] [facility];  Service: Cardiovascular;  Laterality: N/A;   ERCP N/A 11/16/2014    Procedure: ENDOSCOPIC RETROGRADE CHOLANGIOPANCREATOGRAPHY (ERCP);  Surgeon: [name], MD;  Location: [place];  Service: Endoscopy;  Laterality: N/A;   ESOPHAGOGASTRODUODENOSCOPY (EGD) WITH PROPOFOL N/A 12/21/2017    Procedure: ESOPHAGOGASTRODUODENOSCOPY (EGD) WITH PROPOFOL;  Surgeon: [name], MD;  [facility];  Service: Endoscopy;  Laterality: N/A;   EYE SURGERY       FOOT SURGERY Left 2005    "fixed bone that stuck out in my ankle area"   HEMORRHOID BANDING       IMPLANTABLE CARDIOVERTER DEFIBRILLATOR IMPLANT   09/06/09    BSX dual chamber ICD implanted in [place] for cardiac arrest 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   [place] Right ~ [place] N/A 11/25/2012    demonstrated EF 30%, inferior akinesis with mild hypokinesis of all walls, patent LAD and RCA stents; ostial PDA with 80-90% obstruction with medical therapy recommended   MOHS SURGERY   2008    nose, skin graft   POLYPECTOMY   12/21/2017    Procedure: POLYPECTOMY;  Surgeon: [name], MD;  [facility];  Service: Endoscopy;;   RETINAL DETACHMENT SURGERY Right 2013   TENOLYSIS Right 12/21/2013    Procedure: TENOLYSIS FLEXOR CARPI RADIALIS ,DEBRIDEMENT RIGHT JOINT WRIST,DEBRIDEMENT SCAPHOTRAPEZIAL TRAPEZOID, REPAIR OF EXTENSOR HOOD;  Surgeon: Daryll Brod, MD;  Location: [place];  Service: Orthopedics;  Laterality: Right;   TOE SURGERY Right 09/2019    3rd toe/hammer toe   V-TACH ABLATION   12/21/2014   VIDEO BRONCHOSCOPY Bilateral 01/09/2016    Procedure: VIDEO BRONCHOSCOPY WITHOUT FLUORO;  Surgeon: Juanito Doom, MD;   [facility];  Service: Cardiopulmonary;  Laterality: Bilateral;             Family History  Problem Relation Age of Onset   Heart attack Brother     CAD Father     Hypertension Father     CAD Mother     Hypertension Mother     Hypertension Brother     Stroke Neg Hx          Social History         Socioeconomic History   Marital status: Married      Spouse name: Not on file   Number of children: Not on file   Years of education: Not on file   Highest education level: Not on file  Occupational History   Occupation: Retired  Tobacco Use   Smoking status: Former      Packs/day: 1.00      Years: 55.00      Total pack years: 55.00      Types: Cigarettes   Smokeless tobacco: Never   Tobacco comments:      off/on, always ready to quit but does not work out  Scientific laboratory technician Use: Never used  Substance and Sexual Activity   Alcohol use: Yes      Alcohol/week: 0.0 standard drinks of alcohol      Comment: occ   Drug  use: No   Sexual activity: Not Currently  Other Topics Concern   Not on file  Social History Narrative   Not on file    Social Determinants of Health        Financial Resource Strain: Medium Risk (03/06/2021)    Overall Financial Resource Strain (CARDIA)     Difficulty of Paying Living Expenses: Somewhat hard  Food Insecurity: Food Insecurity Present (03/06/2021)    Hunger Vital Sign     Worried About Running Out of Food in the Last Year: Sometimes true     Ran Out of Food in the Last Year: Never true  Transportation Needs: No Transportation Needs (03/06/2021)    PRAPARE - Armed forces logistics/support/administrative officer (Medical): No     Lack of Transportation (Non-Medical): No  Physical Activity: Insufficiently Active (03/06/2021)    Exercise Vital Sign     Days of Exercise per Week: 4 days     Minutes of Exercise per Session: 20 min  Stress: Stress Concern Present (03/06/2021)    [place]     Feeling of Stress : Rather much  Social Connections: Moderately Isolated (03/06/2021)    Social Connection and Isolation Panel [NHANES]     Frequency of Communication with Friends and Family: More than three times a week     Frequency of Social Gatherings with Friends and Family: Once a week     Attends Religious Services: Never     Marine scientist or Organizations: No     Attends Archivist Meetings: Never     Marital Status: Married  Human resources officer Violence: Not At Risk (03/06/2021)    Humiliation, Afraid, Rape, and Kick questionnaire     Fear of Current or Ex-Partner: No     Emotionally Abused: No     Physically Abused: No     Sexually Abused: No        BP 112/68   Pulse 71   Ht 6' (1.829 m)   Wt 192 lb (87.1 kg)   SpO2 94%   BMI 26.04 kg/m    Physical Exam:   Well appearing NAD HEENT: Unremarkable Neck:  No JVD, no thyromegally Lymphatics:  No adenopathy Back:  No CVA tenderness Lungs:  Clear with no wheezes HEART:  Regular rate rhythm, no murmurs, no rubs, no clicks Abd:  soft, positive bowel sounds, no organomegally, no rebound, no guarding Ext:  2 plus pulses, no edema, no cyanosis, no clubbing Skin:  No rashes no nodules Neuro:  CN II through XII intact, motor grossly intact   EKG - NSR with IVCD   DEVICE  Normal device function.  See PaceArt for details. He is at [facility].   Assess/Plan: VT - he continues to have occaisional episodes of slow VT. He will continue his current meds. ICD - he has reached ERI and will undergo gen change in the coming weeks. Chronic systolic heart failure -his symptoms are class 2. No change in his meds. PAF - he is maintaining NSR. No change.   Carleene Overlie Beautiful [place]yl,MD

## 2022-04-21 ENCOUNTER — Encounter (HOSPITAL_COMMUNITY): Payer: Self-pay | Admitting: Internal Medicine

## 2022-04-28 ENCOUNTER — Other Ambulatory Visit: Payer: Self-pay | Admitting: *Deleted

## 2022-04-28 DIAGNOSIS — I480 Paroxysmal atrial fibrillation: Secondary | ICD-10-CM

## 2022-04-28 MED ORDER — APIXABAN 5 MG PO TABS: 5.0000 mg | ORAL_TABLET | Freq: Two times a day (BID) | ORAL | 1 refills | Status: DC

## 2022-04-28 NOTE — Telephone Encounter (Signed)
Eliquis '5mg'$  refill request received. Patient is 82 years old, weight-83.5kg, Crea-0.70 on 04/11/22, Diagnosis-Afib, and last seen by  Diona Browner on 04/15/22. Dose is appropriate based on dosing criteria. Will send in refill to requested pharmacy.

## 2022-04-30 ENCOUNTER — Ambulatory Visit: Payer: Medicare Other

## 2022-05-01 ENCOUNTER — Ambulatory Visit: Payer: Medicare Other | Attending: Interventional Cardiology

## 2022-05-01 ENCOUNTER — Other Ambulatory Visit (HOSPITAL_COMMUNITY): Payer: Self-pay

## 2022-05-01 DIAGNOSIS — I472 Ventricular tachycardia, unspecified: Secondary | ICD-10-CM

## 2022-05-01 DIAGNOSIS — I5022 Chronic systolic (congestive) heart failure: Secondary | ICD-10-CM

## 2022-05-01 LAB — CUP PACEART INCLINIC DEVICE CHECK
Date Time Interrogation Session: 20240202101350
HighPow Impedance: 47 Ohm
Implantable Lead Connection Status: 753985
Implantable Lead Connection Status: 753985
Implantable Lead Implant Date: 20110610
Implantable Lead Implant Date: 20110610
Implantable Lead Location: 753859
Implantable Lead Location: 753860
Implantable Lead Model: 185
Implantable Lead Model: 4135
Implantable Lead Serial Number: 28681386
Implantable Lead Serial Number: 339643
Implantable Pulse Generator Implant Date: 20240122
Lead Channel Impedance Value: 425 Ohm
Lead Channel Impedance Value: 631 Ohm
Lead Channel Pacing Threshold Amplitude: 1 V
Lead Channel Pacing Threshold Amplitude: 1.8 V
Lead Channel Pacing Threshold Pulse Width: 0.4 ms
Lead Channel Pacing Threshold Pulse Width: 0.4 ms
Lead Channel Sensing Intrinsic Amplitude: 13.3 mV
Lead Channel Sensing Intrinsic Amplitude: 2.4 mV
Lead Channel Setting Pacing Amplitude: 2 V
Lead Channel Setting Pacing Amplitude: 2.4 V
Lead Channel Setting Pacing Pulse Width: 0.4 ms
Lead Channel Setting Sensing Sensitivity: 0.5 mV
Pulse Gen Serial Number: 238282

## 2022-05-01 LAB — BASIC METABOLIC PANEL
BUN/Creatinine Ratio: 20 (ref 10–24)
BUN: 14 mg/dL (ref 8–27)
CO2: 24 mmol/L (ref 20–29)
Calcium: 9.2 mg/dL (ref 8.6–10.2)
Chloride: 106 mmol/L (ref 96–106)
Creatinine, Ser: 0.71 mg/dL — ABNORMAL LOW (ref 0.76–1.27)
Glucose: 107 mg/dL — ABNORMAL HIGH (ref 70–99)
Potassium: 4.3 mmol/L (ref 3.5–5.2)
Sodium: 141 mmol/L (ref 134–144)
eGFR: 91 mL/min/{1.73_m2} (ref >59)

## 2022-05-01 NOTE — Patient Instructions (Addendum)
After Your ICD (Implantable Cardiac Defibrillator)  STOP your ELIQUIS.  Do NOT take any Eliquis until Tuesday 05/05/2022.  You may restart your Eliquis on 05/05/2022 your AM dose.    Monitor your defibrillator site for redness, swelling, and drainage. Call the device clinic at [phone] if you experience these symptoms or fever/chills.  Your incision was closed with Steri-strips or staples:  You may shower 7 days after your procedure and wash your incision with soap and water. Avoid lotions, ointments, or perfumes over your incision until it is well-healed.  You may use a hot tub or a pool after your wound check appointment if the incision is completely closed.  There are no restrictions in arm movement after your wound check appointment.  Your ICD is designed to protect you from life threatening heart rhythms. Because of this, you may receive a shock.   1 shock with no symptoms:  Call the office during business hours. 1 shock with symptoms (chest pain, chest pressure, dizziness, lightheadedness, shortness of breath, overall feeling unwell):  Call 911. If you experience 2 or more shocks in 24 hours:  Call 911. If you receive a shock, you should not drive.  [place] DMV - no driving for 6 months if you receive appropriate therapy from your ICD.   ICD Alerts:  Some alerts are vibratory and others beep. These are NOT emergencies. Please call our office to let us know. If this occurs at night or on weekends, it can wait until the next business day. Send a remote transmission.  If your device is capable of reading fluid status (for heart failure), you will be offered monthly monitoring to review this with you.   Remote monitoring is used to monitor your ICD from home. This monitoring is scheduled every 91 days by our office. It allows [place] to keep an eye on the functioning of your device to ensure it is working properly. You will routinely see your Electrophysiologist annually (more often if necessary).

## 2022-05-01 NOTE — Progress Notes (Signed)
Wound check appointment. Steri-strips removed. Wound without redness.  Slight soft edema noted.  Assessed by Dr. Lovena Le.  Will have Pt hold Eliquis until 05/05/2022.  Incision edges approximated. Normal device function. Thresholds, sensing, and impedances consistent with previous measurements. Device programmed at chronic settings for generator change. Histogram distribution appropriate for patient and level of activity. No mode switches.  Brief ventricular arrhythmia noted. Patient educated about wound care and shock plan. ROV in 3 months with implanting physician.

## 2022-05-04 ENCOUNTER — Other Ambulatory Visit: Payer: Self-pay | Admitting: General Surgery

## 2022-05-04 DIAGNOSIS — K4090 Unilateral inguinal hernia, without obstruction or gangrene, not specified as recurrent: Secondary | ICD-10-CM

## 2022-05-04 DIAGNOSIS — Z9889 Other specified postprocedural states: Secondary | ICD-10-CM

## 2022-05-06 ENCOUNTER — Telehealth: Payer: Self-pay

## 2022-05-06 NOTE — Telephone Encounter (Signed)
Lmom to discuss lab results. Waiting on a return call.  

## 2022-05-08 ENCOUNTER — Other Ambulatory Visit (HOSPITAL_COMMUNITY): Payer: Self-pay

## 2022-05-14 ENCOUNTER — Encounter: Payer: Self-pay | Admitting: Cardiovascular Disease

## 2022-05-14 NOTE — Telephone Encounter (Signed)
Letter mailed to pt with lab results. Current phone number isn't in service.

## 2022-05-19 ENCOUNTER — Inpatient Hospital Stay: Admission: RE | Admit: 2022-05-19 | Payer: Medicare Other | Source: Ambulatory Visit

## 2022-05-20 ENCOUNTER — Other Ambulatory Visit: Payer: Self-pay | Admitting: Orthopedic Surgery

## 2022-06-01 ENCOUNTER — Ambulatory Visit
Admission: RE | Admit: 2022-06-01 | Discharge: 2022-06-01 | Disposition: A | Discharge status: Home / Self Care | Payer: Medicare Other | Source: Ambulatory Visit | Attending: General Surgery | Admitting: General Surgery

## 2022-06-01 DIAGNOSIS — Z9889 Other specified postprocedural states: Secondary | ICD-10-CM

## 2022-06-01 DIAGNOSIS — K4090 Unilateral inguinal hernia, without obstruction or gangrene, not specified as recurrent: Secondary | ICD-10-CM

## 2022-06-01 MED ORDER — IOPAMIDOL (ISOVUE-300) INJECTION 61%
100.0000 mL | Freq: Once | INTRAVENOUS | Status: AC | PRN
  Administered 2022-06-01: 100 mL via INTRAVENOUS

## 2022-06-03 ENCOUNTER — Telehealth: Payer: Self-pay | Admitting: Cardiovascular Disease

## 2022-06-03 NOTE — Telephone Encounter (Signed)
Can he come in tomorrow at 10:00 AM? - add him on at the end of my schedule please. I have not actually seen him since 2019, but I believe he will see me after Dr. Thompson Caul retirement.

## 2022-06-03 NOTE — Telephone Encounter (Signed)
Spoke with pt he states that he states that he is having exertional SOB and experiences SOB when "lying down the first 30-40 seconds", Then it will resolve in about 5 minutes. He also states that he also is SOB when he is talking. He has not taken his blood pressure.  He states that he does also have exertional dizziness as well. This happens intermittently thruout the day. He states that yesterday it was constant for most of  the day.  Informed pt to take his blood pressure 1-2 hours after taking his medication, when he has cardiac symptoms and at bedtime. Verbalized understanding. He will call with any abnormal BP values. He will go to the ER when needed.

## 2022-06-03 NOTE — Telephone Encounter (Signed)
Pt c/o Shortness Of Breath: STAT if SOB developed within the last 24 hours or pt is noticeably SOB on the phone  1. Are you currently SOB (can you hear that pt is SOB on the phone)? Yes  2. How long have you been experiencing SOB? A week   3. Are you SOB when sitting or when up moving around? Worse when moving around or laying down  4. Are you currently experiencing any other symptoms? No    Pt thinks that it may be related to his injection fraction.

## 2022-06-03 NOTE — Telephone Encounter (Signed)
Returned call to pt he states that he can come tomorrow, if he will unable to make it he will call "right back" after he speaks with his wife.

## 2022-06-04 ENCOUNTER — Ambulatory Visit: Payer: Medicare Other | Attending: Cardiovascular Disease | Admitting: Cardiovascular Disease

## 2022-06-04 ENCOUNTER — Encounter: Payer: Self-pay | Admitting: Cardiovascular Disease

## 2022-06-04 VITALS — BP 112/60 | HR 66 | Ht 72.0 in | Wt 196.0 lb

## 2022-06-04 DIAGNOSIS — D6869 Other thrombophilia: Secondary | ICD-10-CM

## 2022-06-04 DIAGNOSIS — Z79899 Other long term (current) drug therapy: Secondary | ICD-10-CM

## 2022-06-04 DIAGNOSIS — E785 Hyperlipidemia, unspecified: Secondary | ICD-10-CM

## 2022-06-04 DIAGNOSIS — R600 Localized edema: Secondary | ICD-10-CM

## 2022-06-04 DIAGNOSIS — I2510 Atherosclerotic heart disease of native coronary artery without angina pectoris: Secondary | ICD-10-CM | POA: Diagnosis not present

## 2022-06-04 DIAGNOSIS — Z95810 Presence of automatic (implantable) cardiac defibrillator: Secondary | ICD-10-CM

## 2022-06-04 DIAGNOSIS — I480 Paroxysmal atrial fibrillation: Secondary | ICD-10-CM

## 2022-06-04 DIAGNOSIS — I5042 Chronic combined systolic (congestive) and diastolic (congestive) heart failure: Secondary | ICD-10-CM

## 2022-06-04 DIAGNOSIS — I472 Ventricular tachycardia, unspecified: Secondary | ICD-10-CM | POA: Diagnosis not present

## 2022-06-04 DIAGNOSIS — I7121 Aneurysm of the ascending aorta, without rupture: Secondary | ICD-10-CM

## 2022-06-04 DIAGNOSIS — I10 Essential (primary) hypertension: Secondary | ICD-10-CM

## 2022-06-04 DIAGNOSIS — Z5181 Encounter for therapeutic drug level monitoring: Secondary | ICD-10-CM

## 2022-06-04 DIAGNOSIS — I714 Abdominal aortic aneurysm, without rupture, unspecified: Secondary | ICD-10-CM

## 2022-06-04 MED ORDER — FUROSEMIDE 20 MG PO TABS: 20.0000 mg | ORAL_TABLET | Freq: Every day | ORAL | 3 refills | Status: DC | PRN

## 2022-06-04 MED ORDER — SPIRONOLACTONE 25 MG PO TABS: 25.0000 mg | ORAL_TABLET | Freq: Every day | ORAL | 3 refills | Status: DC

## 2022-06-04 NOTE — Progress Notes (Signed)
Cardiology Office Note:    Date:  06/04/2022   ID:  TIN STANKUS, DOB June 30, 1940, MRN [id number]  PCP:  Raj Janus   [facility] Cardiologist:  Sanda Klein, MD Electrophysiologist:  Cristopher Peru, MD     Referring MD: Leia Alf, [place]   Chief Complaint  Patient presents with   Shortness of Breath       Previous patient of Dr. Daneen Schick, transitioning care after Dr. Thompson Caul retirement  History of Present Illness:    Steven Guzman is a 82 y.o. 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  He was cared for by Dr. Daneen Schick for many years.  His electrophysiologist is Dr. Cristopher Peru.  The patient's wife, Elex Montague is my patient as well.  He had a right hip fracture November 2023 treated with hemiarthroplasty.  He was hospitalized with chest pain in December 2023 and had a small non-STEMI.  Cardiac catheterization showed the culprit lesion to be complete occlusion of OM 3 with otherwise stable anatomy (patent stents in LAD and RCA and unchanged 80% ostial stenosis of the right PDA).  LVEDP at catheterization was mildly elevated at 16 mmHg  (he weighed  roughly 190 pounds on that day).  EF dropped further to 25-30%.  He was started on treatment with Delene Loll and Jardiance at that time.  Spironolactone was subsequently added at his follow-up visit in the office.  In January he underwent uncomplicated surgical repair of a very large, partially incarcerated left inguinal hernia containing a large portion of the sigmoid colon.  He has recently had some problems with exertional dyspnea, NYHA functional class IIIa.  He does not have shortness of breath with activities of daily living or personal self-care, but is unable to walk more than 1 or 2 miles in a grocery store.  He has also noticed some mild edema in the lower extremities.  He does not have orthopnea or PND.  He cannot consciously monitors his weight at home.  He is well aware of the need for sodium restriction.  During his most recent episode of heart failure exacerbation his weight peaked at 204 pounds.  Earlier this year his minimum weight went down to 182 pounds and this has crept up gradually to 191 pounds on his home scale today (our office scale shows 5 pounds more).  He has not had any recent problems with palpitations, dizziness, syncope, defibrillator discharges.  He has not had any falls or bleeding problems.  He does have easy bruising.  He inadvertently stopped escitalopram abruptly when his prescription could not be refilled and went through a period of time when his hand tremor became substantially worse.  This caused some concern about Parkinson's, but he denies any other symptoms consistent with Parkinson's disease.  He does not  know if his parents may have had tremor since they died at relatively young ages.  Interrogation of his defibrillator today shows normal device function.  The recently placed new generator has an estimated longevity of 13 years.  He does not require either atrial or ventricular pacing.  The device has recorded 2 episodes of "nonsustained VT" 1 is  true ventricular tachycardia consisting of only 13 consecutive beats and quite slow at 112 bpm.  The other 1 is a false event due to paroxysmal atrial tachycardia with 1: 1 AV block, 10 beats long.  Heart failure management parameters have only recently ", line" and today his "heart logic" score is 1.  We have only a few weeks of trending thoracic impedance, but this appears to be fairly stable.  His next office visit for defibrillator check with Dr. Lovena Le is on April 25.  His heart failure regimen is very much state-of-the-art including maximum dose Entresto a small dose of carvedilol, Jardiance, spironolactone.  Although his prescription for spironolactone was from 12.5 mg daily he is actually been taking a whole 25 mg tablet on a daily basis.  He has not been taking any furosemide since he was started on [place].  Past Medical History:  Diagnosis Date   AICD (automatic cardioverter/defibrillator) present    [employer]   Aneurysm [facility])    a. Aneurysmal infrarenal aorta up to 33 mm on CT 10/2014, recommended f/u due 10/2017   Anginal pain ([place])    Anxiety    Basal cell carcinoma of nose    S/P MOHS   Biliary acute pancreatitis    CAD (coronary artery disease)    a. s/p MI in 1994 with PCI to LAD at that time b. cath 10/2012 demonstrated EF 30%, inferior akinesis with mild hypokinesis of all walls, patent LAD and RCA stents; ostial PDA with 80-90% obstruction with medical therapy recommended    Chronic systolic CHF (congestive heart failure) (HCC)    EF 30 to 35 % as of 09/2014.    CKD (chronic kidney disease), stage III ([place])    Complication of anesthesia 10/2014   "had to have defibrillator w/ERCP"   COPD (chronic obstructive pulmonary disease) ([place])    a. followed by pulmonary, COPD GOLD stage II   Depression    Diverticulosis of colon 07/2014   noted on CT   GERD (gastroesophageal reflux disease)    Hiatal hernia    Hyperglycemia 10/2012   Hyperlipidemia     Hypertension    Myocardial infarction [facility]) 1994; 2011   Pneumonia 1946; 2015   Prostate enlargement 07/2014   observed on CT   Tobacco abuse    Ventricular tachycardia ([place])    a. 08/2009 s/p BSX E110 Teligen 100 AICD, ser#: [id number];  b. 08/2008 VT req ATP - detection reprogrammed from 160 to 150. c. EPS and VT ablation by Dr. Lovena Le 12/21/2014    Past Surgical History:  Procedure Laterality Date   BIOPSY  12/21/2017   Procedure: BIOPSY;  Surgeon: [name], MD;  [facility]: [facility];  Service: Endoscopy;;   CATARACT EXTRACTION W/ INTRAOCULAR LENS  IMPLANT, BILATERAL Bilateral ~ 2011   COLONOSCOPY     COLONOSCOPY WITH PROPOFOL N/A 12/21/2017   Procedure: COLONOSCOPY WITH PROPOFOL;  Surgeon: [name], MD;  [facility];  Service: Endoscopy;  Laterality: N/A;   ELECTROPHYSIOLOGIC STUDY N/A 12/21/2014   Procedure: V Tach Ablation;  Surgeon: [name], MD;  Location: [place] [facility];  Service: Cardiovascular;  Laterality: N/A;   ERCP N/A 11/16/2014   Procedure: ENDOSCOPIC RETROGRADE CHOLANGIOPANCREATOGRAPHY (ERCP);  Surgeon: [name], MD;  Location: [place];  Service: Endoscopy;  Laterality: N/A;   ESOPHAGOGASTRODUODENOSCOPY (EGD) WITH PROPOFOL N/A 12/21/2017   Procedure: ESOPHAGOGASTRODUODENOSCOPY (EGD) WITH PROPOFOL;  Surgeon: [name], MD;  [facility];  Service: Endoscopy;  Laterality: N/A;   EYE SURGERY     FOOT SURGERY Left 2005   "fixed bone that stuck out in my ankle area"   [place] N/A 04/20/2022   Procedure: [place];  Surgeon: [name], MD;  Location: [place] [facility];  Service: Cardiovascular;  Laterality: N/A;   IMPLANTABLE CARDIOVERTER DEFIBRILLATOR IMPLANT  09/06/09   BSX dual chamber ICD implanted in [place] for cardiac arrest 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   [place] Right ~ [place] Left 04/10/2022   Procedure: HERNIA REPAIR INGUINAL  ADULT;  Surgeon: Rolm Bookbinder, MD;  Location: [place];  Service: General;  Laterality: Left;   LEFT HEART CATH AND CORONARY ANGIOGRAPHY N/A 03/19/2022   Procedure: LEFT HEART CATH AND CORONARY ANGIOGRAPHY;  Surgeon: Martinique, Peter M, MD;  Location: [place] [facility];  Service: Cardiovascular;  Laterality: N/A;   LEFT HEART CATHETERIZATION WITH CORONARY ANGIOGRAM N/A 11/25/2012   demonstrated EF 30%, inferior akinesis with mild hypokinesis of all walls, patent LAD and RCA stents; ostial PDA with 80-90% obstruction with medical therapy recommended   MOHS SURGERY  2008   nose, skin graft   POLYPECTOMY  12/21/2017   Procedure: POLYPECTOMY;  Surgeon: [name], MD;  [facility];  Service: Endoscopy;;   RETINAL DETACHMENT SURGERY Right 2013   TENOLYSIS Right 12/21/2013   Procedure: TENOLYSIS FLEXOR CARPI RADIALIS ,DEBRIDEMENT RIGHT JOINT WRIST,DEBRIDEMENT SCAPHOTRAPEZIAL TRAPEZOID, REPAIR OF EXTENSOR HOOD;  Surgeon: Daryll Brod, MD;  Location: [place];  Service: Orthopedics;  Laterality: Right;   TOE SURGERY Right 09/2019   3rd toe/hammer toe   V-TACH ABLATION  12/21/2014   VIDEO BRONCHOSCOPY Bilateral 01/09/2016   Procedure: VIDEO BRONCHOSCOPY WITHOUT FLUORO;  Surgeon: Juanito Doom, MD;  [facility];  Service: Cardiopulmonary;  Laterality: Bilateral;    Current Medications: Current Meds  Medication Sig   albuterol (PROVENTIL) (2.5 MG/3ML) 0.083% nebulizer solution USE 1 VIAL VIA NEBULIZER EVERY 6 HOURS AS NEEDED FOR WHEEZING OR SHORTNESS OF BREATH (Patient taking differently: Take 2.5 mg by nebulization See admin instructions. USE 1 VIAL VIA NEBULIZER EVERY 6 HOURS AS NEEDED FOR WHEEZING OR SHORTNESS OF BREATH)   amiodarone (PACERONE) 200 MG tablet TAKE ONE TABLET BY MOUTH DAILY   apixaban (ELIQUIS) 5 MG TABS tablet Take 1 tablet (5 mg total) by mouth 2 (two) times daily.   ascorbic acid (VITAMIN C) 500 MG tablet Take 1 tablet by mouth daily.    aspirin EC 81 MG tablet Take 1 tablet (81 mg total) by mouth daily. Swallow whole.   busPIRone (BUSPAR) 15 MG tablet Take 15 mg by mouth 2 (two) times daily. Patient taking 10 mg by mouth twice daily   calcium citrate (CALCITRATE - DOSED IN MG ELEMENTAL CALCIUM) 950 (200 Ca) MG tablet Take 200 mg of elemental calcium by mouth daily.   carvedilol (COREG) 3.125 MG tablet Take 1 tablet (3.125 mg total) by mouth 2 (two) times daily.   cetirizine (ZYRTEC) 10 MG tablet Take 1 tablet by mouth daily.   Cyanocobalamin (VITAMIN  B-12) 5000 MCG SUBL Place under the tongue daily.   empagliflozin (JARDIANCE) 10 MG TABS tablet Take 1 tablet (10 mg total) by mouth daily.   Fenofibric Acid 35 MG TABS Take 35 mg by mouth daily.   fluticasone (FLONASE) 50 MCG/ACT nasal spray Place 2 sprays into both nostrils daily.   gabapentin (NEURONTIN) 300 MG capsule Take 1 capsule (300 mg total) by mouth 2 (two) times daily.   guaiFENesin (MUCINEX) 600 MG 12 hr tablet Take 1 tablet by mouth 2 (two) times daily.   Ipratropium-Albuterol (COMBIVENT RESPIMAT) 20-100 MCG/ACT AERS respimat Inhale 1 puff into the lungs every 6 (six) hours. Shortness of breath or wheezing   IRON PO Take 1 tablet by mouth daily.   MAGNESIUM-OXIDE 400 (241.3 Mg) MG tablet TAKE 1 TABSULE BY MOUTH DAILY (Patient taking differently: Take 400 mg by mouth daily.)   mexiletine (MEXITIL) 200 MG capsule Take 1 capsule (200 mg total) by mouth 2 (two) times daily.   Multiple Vitamins-Minerals (CENTRUM ADULTS PO) Take by mouth daily.   nitroGLYCERIN (NITROSTAT) 0.4 MG SL tablet Place 1 tablet (0.4 mg total) under the tongue every 5 (five) minutes as needed for chest pain. DISSOLVE 1 TABLET UNDER THE TONGUE EVERY 5 MINUTES FOR 3 DOSES   omeprazole (PRILOSEC) 20 MG capsule Take 20 mg by mouth daily.    Respiratory Therapy Supplies (FLUTTER) DEVI Use as directed (Patient taking differently: 1 each by Other route See admin instructions. Use as directed)   rosuvastatin  (CRESTOR) 40 MG tablet Take 1 tablet (40 mg total) by mouth daily. (Patient taking differently: Take 40 mg by mouth at bedtime.)   sacubitril-valsartan (ENTRESTO) 24-26 MG Take 1 tablet by mouth 2 (two) times daily.   Sennosides-Docusate Sodium (STOOL SOFTENER/LAXATIVE PO) Take 2 tablets by mouth daily.   Spacer/Aero-Holding Chambers ([place]) MISC optichamber [facility] (Patient taking differently: 1 each by Other route See admin instructions. optichamber [facility])   STIOLTO RESPIMAT 2.5-2.5 MCG/ACT AERS Inhale 2 puffs into the lungs daily.   tamsulosin (FLOMAX) 0.4 MG CAPS capsule TAKE ONE CAPSULE BY MOUTH DAILY AFTER SUPPER (Patient taking differently: Take 0.4 mg by mouth See admin instructions. TAKE ONE CAPSULE BY MOUTH DAILY AFTER SUPPER)   [DISCONTINUED] furosemide (LASIX) 20 MG tablet Take 1 tablet (20 mg total) by mouth daily as needed for fluid or edema.   [DISCONTINUED] spironolactone (ALDACTONE) 25 MG tablet Take 0.5 tablets (12.5 mg total) by mouth daily.     Allergies:   Sulfa antibiotics   Social History   Socioeconomic History   Marital status: Married    Spouse name: Not on file   Number of children: Not on file   Years of education: Not on file   Highest education level: Not on file  Occupational History   Occupation: Retired  Tobacco Use   Smoking status: Former    Packs/day: 1.00    Years: 55.00    Total pack years: 55.00    Types: Cigarettes   Smokeless tobacco: Never   Tobacco comments:    off/on, always ready to quit but does not work out  Scientific laboratory technician Use: Never used  Substance and Sexual Activity   Alcohol use: Yes    Alcohol/week: 0.0 standard drinks of alcohol    Comment: occ   Drug use: No   Sexual activity: Not Currently  Other Topics Concern   Not on file  Social History Narrative   Not on file   Social Determinants of  Health   Financial Resource Strain: Medium Risk (03/06/2021)   Overall Financial Resource Strain (CARDIA)     Difficulty of Paying Living Expenses: Somewhat hard  Food Insecurity: No Food Insecurity (04/13/2022)   Hunger Vital Sign    Worried About Running Out of Food in the Last Year: Never true    Ran Out of Food in the Last Year: Never true  Transportation Needs: No Transportation Needs (04/13/2022)   PRAPARE - Hydrologist (Medical): No    Lack of Transportation (Non-Medical): No  Physical Activity: Insufficiently Active (03/06/2021)   Exercise Vital Sign    Days of Exercise per Week: 4 days    Minutes of Exercise per Session: 20 min  Stress: Stress Concern Present (03/06/2021)   [place]    Feeling of Stress : Rather much  Social Connections: Moderately Isolated (03/06/2021)   Social Connection and Isolation Panel [NHANES]    Frequency of Communication with Friends and Family: More than three times a week    Frequency of Social Gatherings with Friends and Family: Once a week    Attends Religious Services: Never    Marine scientist or Organizations: No    Attends Music therapist: Never    Marital Status: Married     Family History: The patient's family history includes CAD in his father and mother; Heart attack in his brother; Hypertension in his brother, father, and mother. There is no history of Stroke.  ROS:   Please see the history of present illness.     All other systems reviewed and are negative.  EKGs/Labs/Other Studies Reviewed:    The following studies were reviewed today: ECHO 03/19/2022:    1. Left ventricular ejection fraction, by estimation, is 25 to 30%. The  left ventricle has severely decreased function. The left ventricle  demonstrates regional wall motion abnormalities with inferior akinesis,  basal to mid inferolateral akinesis,  anterolateral severe hypokinesis. The left ventricular internal cavity  size was mildly dilated. There is mild  concentric left ventricular  hypertrophy. Left ventricular diastolic parameters are consistent with  Grade I diastolic dysfunction (impaired  relaxation).   2. Right ventricular systolic function is normal. The right ventricular  size is normal. There is normal pulmonary artery systolic pressure. The  estimated right ventricular systolic pressure is 0000000 mmHg.   3. Left atrial size was mildly dilated.   4. Right atrial size was mildly dilated.   5. The mitral valve is abnormal. Mild to moderate mitral valve  regurgitation. No evidence of mitral stenosis.   6. The aortic valve is tricuspid. There is mild calcification of the  aortic valve. Aortic valve regurgitation is mild. No aortic stenosis is  present.   7. Aortic dilatation noted. There is mild dilatation of the aortic root,  measuring 39 mm.   8. The inferior vena cava is dilated in size with >50% respiratory  variability, suggesting right atrial pressure of 8 mmHg.   Cardiac cath 03/19/2022     Mid LM to Dist LM lesion is 20% stenosed.   Ost LAD to Prox LAD lesion is 25% stenosed.   Prox LAD to Mid LAD lesion is 35% stenosed.   3rd Mrg lesion is 100% stenosed.   Prox RCA to Mid RCA lesion is 40% stenosed.   RPDA lesion is 80% stenosed.   There is mild left ventricular systolic dysfunction.   LV end diastolic pressure is  mildly elevated.   The left ventricular ejection fraction is 45-50% by visual estimate.   2 vessel obstructive CAD - 100% occlusion of distal OM3 appears to be the culprit. The ostial PDA lesion is unchanged from 2014 Patent stents in the LAD and RCA Mild LV dysfunction with inferior wall motion abnormality Mildly elevated LVEDP 16 mm Hg   Plan: recommend medical therapy   EKG:  EKG is  ordered today.  The ekg ordered today demonstrates normal sinus rhythm, minor nonspecific intraventricular conduction delay, prolonged QTc 528 ms.  There are no acute ischemic repolarization abnormalities.  Recent  Labs: 03/18/2022: B Natriuretic Peptide 90.1 04/08/2022: ALT 23 04/11/2022: Magnesium 2.1 04/12/2022: Hemoglobin 10.7; Platelets 131 05/01/2022: BUN 14; Creatinine, Ser 0.71; Potassium 4.3; Sodium 141  Recent Lipid Panel    Component Value Date/Time   CHOL 162 03/19/2022 0205   CHOL 117 07/14/2021 1026   TRIG 75 03/19/2022 0205   HDL 44 03/19/2022 0205   HDL 47 07/14/2021 1026   CHOLHDL 3.7 03/19/2022 0205   VLDL 15 03/19/2022 0205   LDLCALC 103 (H) 03/19/2022 0205   LDLCALC 53 07/14/2021 1026   LDLCALC 67 03/01/2020 1344     Risk Assessment/Calculations:                Physical Exam:    VS:  BP 112/60 (BP Location: Left Arm, Patient Position: Sitting, Cuff Size: Normal)   Pulse 66   Ht 6' (1.829 m)   Wt 196 lb (88.9 kg)   SpO2 92%   BMI 26.58 kg/m     Wt Readings from Last 3 Encounters:  06/04/22 196 lb (88.9 kg)  04/20/22 184 lb (83.5 kg)  04/15/22 186 lb (84.4 kg)     GEN: Appears comfortable, speaking in full sentences without interruption.  Well nourished, well developed in no acute distress.  Healthy left subclavian defibrillator site. HEENT: Normal NECK: No JVD; No carotid bruits LYMPHATICS: No lymphadenopathy CARDIAC: RRR, no murmurs, rubs, gallops RESPIRATORY:  Clear to auscultation without rales, wheezing or rhonchi  ABDOMEN: Soft, non-tender, non-distended MUSCULOSKELETAL: 1+ symmetrical lower extremity edema, roughly halfway up the calves; No deformity  SKIN: Warm and dry NEUROLOGIC:  Alert and oriented x 3, nonfocal other than mild intentional tremor in both hands PSYCHIATRIC:  Normal affect   ASSESSMENT:    1. Chronic combined systolic and diastolic heart failure ([place])   2. On amiodarone therapy   3. Coronary artery disease involving native coronary artery of native heart without angina pectoris   4. Ventricular tachycardia ([place])   5. Encounter for monitoring amiodarone therapy   6. Paroxysmal atrial fibrillation (HCC)   7. Acquired  thrombophilia ([place])   8. Hyperlipidemia LDL goal <70   9. Essential hypertension   10. ICD (implantable cardioverter-defibrillator) in place   11. Aneurysm of ascending aorta without rupture ([place])   12. Abdominal aortic aneurysm (AAA) without rupture, unspecified part ([place])   13. Edema of both lower extremities    PLAN:    In order of problems listed above:  CHF: He has severely depressed left ventricular systolic function.  NYHA functional class II-3A.  Some subtle signs of hypervolemia on exam, with a weight roughly 9 pounds higher than what might have been his optimal volume a few weeks ago.  Thoracic impedance on his ICD appears stable, but has only started recording for the last few weeks, since he had a recent generator change.  He is on Entresto/SGLT2 inhibitor/carvedilol/spironolactone.  Will add back furosemide  20 mg daily.  He should only continue the furosemide as long as his weight is above a target "dry weight" that I set fairly arbitrarily at 185 pounds.  He should continue to monitor his weight on a daily basis and follow a low-sodium diet.  Will check a proBNP today as well as electrolytes and renal function parameters.  Will continue to "fine-tune" his dry weight and medications for heart failure.  He asked about the prognosis with heart failure.  He has already been living with severely depressed left ventricular systolic function for about 30 years, we talked about the independent impact that functional status and EF have on prognosis.  Recheck LVEF now that he has been on Entresto, Jardiance and spironolactone for almost 3 months.  Doubt we will see a major improvement since he has had severely depressed LVEF due to extensive scars of previous myocardial infarctions.  Will continue to treat this as a manageable chronic condition. CAD: Recent small non-STEMI due to occlusion of a small oblique marginal branch.  He currently does not have any angina pectoris.  He is on apixaban and  aspirin.  I thought, judging by the notes from his last cardiology hospitalization of the plan was for Eliquis and clopidogrel, but he is tolerating the aspirin without overt bleeding problems.  Will check a CBC as well. He is taking a high dose of but are very effective statin. VT: In the last couple of months he is only had 1 very brief episode of very slow nonsustained VT.  He is on amiodarone therapy. Amiodarone: Last TSH check was in January 2023.  Will update this.  He had normal liver function tests in January 2024. AFib: None documented recently, at least not since his ICD generator change in January. Anticoagulation: Hemoglobin was 10.7 on January 14, before his abdominal surgery.  Will recheck this.  No overt bleeding problems on apixaban, although he does bruise very easily. HLP: Most recent LDL cholesterol was a little above our target range at 103.  Will recheck it on the rosuvastatin.  Target LDL less than 70, ideally less than 55. HTN: Not sure if his blood pressure will allow further titration of the Entresto.  Wait for the repeat labs. CKD: Reportedly has CKD stage III but very recently performed labs on 05/01/2022 showed an excellent creatinine of 0.71.  Electrolytes are recently normal. ICD: Normal device function.  Going forward the heart failure management protocols will be useful, but these are not yet "mature".  Not sure they will help [place] in the next few weeks.  Has a follow-up appoint with Dr. Lovena Le next month. AAA: Most recent measurement of his ascending aortic aneurysm by CT in November 2023 was 4.5 cm.  The distal aortic arch and ascending aorta are also involved and he has a small infrarenal AAA measuring 3.4 cm.  These values have been relatively stable over time.  Will repeat scans in a year. Tremor: Has fine resting and intentional tremor.  Suspect that this is essential tremor.  He does not have any other features to suggest Parkinson's disease.  May have had transient  worsening due to inadvertent withdrawal from escitalopram.  Need to monitor periodically make sure this is not worsened by continue treatment with amiodarone.           Medication Adjustments/Labs and Tests Ordered: Current medicines are reviewed at length with the patient today.  Concerns regarding medicines are outlined above.  Orders Placed This Encounter  Procedures  Basic metabolic panel   Pro b natriuretic peptide (BNP)   CBC   TSH   EKG 12-Lead   ECHOCARDIOGRAM COMPLETE   Meds ordered this encounter  Medications   spironolactone (ALDACTONE) 25 MG tablet    Sig: Take 1 tablet (25 mg total) by mouth daily.    Dispense:  90 tablet    Refill:  3   furosemide (LASIX) 20 MG tablet    Sig: Take 1 tablet (20 mg total) by mouth daily as needed (Take any day that your weight is over 185).    Dispense:  90 tablet    Refill:  3    Patient Instructions  Medication Instructions:  Spironolactone '25mg'$  daily Furosemide '20mg'$  on any day your weight is over 185lb *If you need a refill on your cardiac medications before your next appointment, please call your pharmacy*   Lab Work: CBC, BMET, TSH, proBNP-today If you have labs (blood work) drawn today and your tests are completely normal, you will receive your results only by: [place] (if you have MyChart) OR A paper copy in the mail If you have any lab test that is abnormal or we need to change your treatment, we will call you to review the results.   Testing/Procedures: Your physician has requested that you have an echocardiogram. Echocardiography is a painless test that uses sound waves to create images of your heart. It provides your doctor with information about the size and shape of your heart and how well your heart's chambers and valves are working. This procedure takes approximately one hour. There are no restrictions for this procedure. Please do NOT wear cologne, perfume, aftershave, or lotions (deodorant is  allowed). Please arrive 15 minutes prior to your appointment time.    Follow-Up: At [facility], you and your health needs are our priority.  As part of our continuing mission to provide you with exceptional heart care, we have created designated Provider Care Teams.  These Care Teams include your primary Cardiologist (physician) and Advanced Practice Providers (APPs -  Physician Assistants and Nurse Practitioners) who all work together to provide you with the care you need, when you need it.  We recommend signing up for the patient portal called "MyChart".  Sign up information is provided on this After Visit Summary.  MyChart is used to connect with patients for Virtual Visits (Telemedicine).  Patients are able to view lab/test results, encounter notes, upcoming appointments, etc.  Non-urgent messages can be sent to your provider as well.   To learn more about what you can do with MyChart, go to [email or web address].    Has appointment 07/28/22 at 0900 with Dr Sallyanne Kuster     Signed, Sanda Klein, MD  06/04/2022 4:54 PM    [place]

## 2022-06-04 NOTE — Patient Instructions (Addendum)
Medication Instructions:  Spironolactone '25mg'$  daily Furosemide '20mg'$  on any day your weight is over 185lb *If you need a refill on your cardiac medications before your next appointment, please call your pharmacy*   Lab Work: CBC, BMET, TSH, proBNP-today If you have labs (blood work) drawn today and your tests are completely normal, you will receive your results only by: [place] (if you have MyChart) OR A paper copy in the mail If you have any lab test that is abnormal or we need to change your treatment, we will call you to review the results.   Testing/Procedures: Your physician has requested that you have an echocardiogram. Echocardiography is a painless test that uses sound waves to create images of your heart. It provides your doctor with information about the size and shape of your heart and how well your heart's chambers and valves are working. This procedure takes approximately one hour. There are no restrictions for this procedure. Please do NOT wear cologne, perfume, aftershave, or lotions (deodorant is allowed). Please arrive 15 minutes prior to your appointment time.    Follow-Up: At [facility], you and your health needs are our priority.  As part of our continuing mission to provide you with exceptional heart care, we have created designated Provider Care Teams.  These Care Teams include your primary Cardiologist (physician) and Advanced Practice Providers (APPs -  Physician Assistants and Nurse Practitioners) who all work together to provide you with the care you need, when you need it.  We recommend signing up for the patient portal called "MyChart".  Sign up information is provided on this After Visit Summary.  MyChart is used to connect with patients for Virtual Visits (Telemedicine).  Patients are able to view lab/test results, encounter notes, upcoming appointments, etc.  Non-urgent messages can be sent to your provider as well.   To learn more about what  you can do with MyChart, go to [email or web address].    Has appointment 07/28/22 at 0900 with Dr Sallyanne Kuster

## 2022-06-05 LAB — CBC
Hematocrit: 36.1 % — ABNORMAL LOW (ref 37.5–51.0)
Hemoglobin: 11.4 g/dL — ABNORMAL LOW (ref 13.0–17.7)
MCH: 30.8 pg (ref 26.6–33.0)
MCHC: 31.6 g/dL (ref 31.5–35.7)
MCV: 98 fL — ABNORMAL HIGH (ref 79–97)
Platelets: 122 10*3/uL — ABNORMAL LOW (ref 150–450)
RBC: 3.7 x10E6/uL — ABNORMAL LOW (ref 4.14–5.80)
RDW: 15.9 % — ABNORMAL HIGH (ref 11.6–15.4)
WBC: 3.6 10*3/uL (ref 3.4–10.8)

## 2022-06-05 LAB — PRO B NATRIURETIC PEPTIDE: NT-Pro BNP: 250 pg/mL (ref 0–486)

## 2022-06-05 LAB — BASIC METABOLIC PANEL
BUN/Creatinine Ratio: 14 (ref 10–24)
BUN: 10 mg/dL (ref 8–27)
CO2: 24 mmol/L (ref 20–29)
Calcium: 9.4 mg/dL (ref 8.6–10.2)
Chloride: 102 mmol/L (ref 96–106)
Creatinine, Ser: 0.71 mg/dL — ABNORMAL LOW (ref 0.76–1.27)
Glucose: 103 mg/dL — ABNORMAL HIGH (ref 70–99)
Potassium: 4.2 mmol/L (ref 3.5–5.2)
Sodium: 143 mmol/L (ref 134–144)
eGFR: 91 mL/min/{1.73_m2} (ref >59)

## 2022-06-05 LAB — TSH: TSH: 2.17 u[IU]/mL (ref 0.450–4.500)

## 2022-06-08 ENCOUNTER — Other Ambulatory Visit: Payer: Self-pay

## 2022-06-08 MED ORDER — NITROGLYCERIN 0.4 MG SL SUBL: 0.4000 mg | SUBLINGUAL_TABLET | SUBLINGUAL | 3 refills | Status: DC | PRN

## 2022-06-19 ENCOUNTER — Other Ambulatory Visit: Payer: Self-pay | Admitting: *Deleted

## 2022-06-19 MED ORDER — NITROGLYCERIN 0.4 MG SL SUBL: 0.4000 mg | SUBLINGUAL_TABLET | SUBLINGUAL | 3 refills | Status: DC | PRN

## 2022-06-30 ENCOUNTER — Ambulatory Visit: Payer: Medicare Other | Admitting: Cardiovascular Disease

## 2022-07-02 ENCOUNTER — Ambulatory Visit (HOSPITAL_COMMUNITY): Payer: Medicare Other | Attending: Cardiology

## 2022-07-02 DIAGNOSIS — I5042 Chronic combined systolic (congestive) and diastolic (congestive) heart failure: Secondary | ICD-10-CM | POA: Insufficient documentation

## 2022-07-02 LAB — ECHOCARDIOGRAM COMPLETE
AR max vel: 2 cm2
AV Area VTI: 2.22 cm2
AV Area mean vel: 1.92 cm2
AV Mean grad: 5 mmHg
AV Peak grad: 9.5 mmHg
Ao pk vel: 1.54 m/s
Area-P 1/2: 3.08 cm2
Est EF: 30
P 1/2 time: 620 msec
S' Lateral: 4.6 cm

## 2022-07-16 ENCOUNTER — Ambulatory Visit: Payer: Medicare Other | Admitting: Orthopedic Surgery

## 2022-07-21 LAB — BASIC METABOLIC PANEL
BUN/Creatinine Ratio: 6 — ABNORMAL LOW (ref 10–24)
BUN: 5 mg/dL — ABNORMAL LOW (ref 8–27)
CO2: 24 mmol/L (ref 20–29)
Calcium: 9.2 mg/dL (ref 8.6–10.2)
Chloride: 103 mmol/L (ref 96–106)
Creatinine, Ser: 0.79 mg/dL (ref 0.76–1.27)
Glucose: 86 mg/dL (ref 70–99)
Potassium: 4.3 mmol/L (ref 3.5–5.2)
Sodium: 140 mmol/L (ref 134–144)
eGFR: 88 mL/min/{1.73_m2} (ref >59)

## 2022-07-22 ENCOUNTER — Telehealth: Payer: Self-pay

## 2022-07-22 NOTE — Telephone Encounter (Signed)
Lmom to discuss lab results. Results have been reviewed via mychart. Pt can call back if he has any questions or concerns.

## 2022-07-23 ENCOUNTER — Encounter: Payer: Self-pay | Admitting: Internal Medicine

## 2022-07-23 ENCOUNTER — Ambulatory Visit: Payer: Medicare Other | Attending: Internal Medicine | Admitting: Internal Medicine

## 2022-07-23 VITALS — BP 108/68 | HR 68 | Ht 72.0 in | Wt 188.4 lb

## 2022-07-23 DIAGNOSIS — I4891 Unspecified atrial fibrillation: Secondary | ICD-10-CM

## 2022-07-23 DIAGNOSIS — I5022 Chronic systolic (congestive) heart failure: Secondary | ICD-10-CM

## 2022-07-23 DIAGNOSIS — Z95810 Presence of automatic (implantable) cardiac defibrillator: Secondary | ICD-10-CM | POA: Diagnosis not present

## 2022-07-23 DIAGNOSIS — I472 Ventricular tachycardia, unspecified: Secondary | ICD-10-CM | POA: Diagnosis not present

## 2022-07-23 NOTE — Progress Notes (Signed)
HPI [name] returns today for ongoing followup of his VT and PAF. He is a pleasant 82 yo man with a h/o COPD, CAD, and anemia. He has been over a year since being seen in our EP clinic. He has not had any ICD shocks. He has class 2 dyspnea which is multifactorial. He has not had syncope. He has stopped smoking. He underwent ICD gen change out about 3 months ago.  Allergies  Allergen Reactions   Sulfa Antibiotics Hives     Current Outpatient Medications  Medication Sig Dispense Refill   albuterol (PROVENTIL) (2.5 MG/3ML) 0.083% nebulizer solution USE 1 VIAL VIA NEBULIZER EVERY 6 HOURS AS NEEDED FOR WHEEZING OR SHORTNESS OF BREATH (Patient taking differently: Take 2.5 mg by nebulization See admin instructions. USE 1 VIAL VIA NEBULIZER EVERY 6 HOURS AS NEEDED FOR WHEEZING OR SHORTNESS OF BREATH) 360 mL 0   amiodarone (PACERONE) 200 MG tablet TAKE ONE TABLET BY MOUTH DAILY 90 tablet 3   apixaban (ELIQUIS) 5 MG TABS tablet Take 1 tablet (5 mg total) by mouth 2 (two) times daily. 180 tablet 1   Artificial Tear Solution (SOOTHE XP OP) Place 2 drops into both eyes daily as needed (dry eyes).     ascorbic acid (VITAMIN C) 500 MG tablet Take 1 tablet by mouth daily.     aspirin EC 81 MG tablet Take 1 tablet (81 mg total) by mouth daily. Swallow whole.     busPIRone (BUSPAR) 15 MG tablet Take 15 mg by mouth 2 (two) times daily. Patient taking 10 mg by mouth twice daily     calcium citrate (CALCITRATE - DOSED IN MG ELEMENTAL CALCIUM) 950 (200 Ca) MG tablet Take 200 mg of elemental calcium by mouth daily.     carvedilol (COREG) 3.125 MG tablet Take 1 tablet (3.125 mg total) by mouth 2 (two) times daily. 180 tablet 3   cetirizine (ZYRTEC) 10 MG tablet Take 1 tablet by mouth daily.     Cyanocobalamin (VITAMIN B-12) 5000 MCG SUBL Place under the tongue daily.     empagliflozin (JARDIANCE) 10 MG TABS tablet Take 1 tablet (10 mg total) by mouth daily. 30 tablet 11   Fenofibric Acid 35 MG TABS Take  35 mg by mouth daily.     fluticasone (FLONASE) 50 MCG/ACT nasal spray Place 2 sprays into both nostrils daily. 16 g 6   furosemide (LASIX) 20 MG tablet Take 1 tablet (20 mg total) by mouth daily as needed (Take any day that your weight is over 185). 90 tablet 3   gabapentin (NEURONTIN) 300 MG capsule Take 1 capsule (300 mg total) by mouth 2 (two) times daily. 180 capsule 1   guaiFENesin (MUCINEX) 600 MG 12 hr tablet Take 1 tablet by mouth 2 (two) times daily.     HYDROcodone-acetaminophen (NORCO/VICODIN) 5-325 MG tablet Take 0.5 tablets by mouth every 6 (six) hours as needed for moderate pain. 25 tablet 0   Ipratropium-Albuterol (COMBIVENT RESPIMAT) 20-100 MCG/ACT AERS respimat Inhale 1 puff into the lungs every 6 (six) hours. Shortness of breath or wheezing 4 g 5   IRON PO Take 1 tablet by mouth daily.     MAGNESIUM-OXIDE 400 (241.3 Mg) MG tablet TAKE 1 TABSULE BY MOUTH DAILY (Patient taking differently: Take 400 mg by mouth daily.) 90 tablet 3   mexiletine (MEXITIL) 200 MG capsule Take 1 capsule (200 mg total) by mouth 2 (two) times daily. 180 capsule 3   Multiple Vitamins-Minerals (CENTRUM ADULTS  PO) Take by mouth daily.     nitroGLYCERIN (NITROSTAT) 0.4 MG SL tablet Place 1 tablet (0.4 mg total) under the tongue every 5 (five) minutes as needed for chest pain. DISSOLVE 1 TABLET UNDER THE TONGUE EVERY 5 MINUTES FOR 3 DOSES 25 tablet 3   omeprazole (PRILOSEC) 20 MG capsule Take 20 mg by mouth daily.      polyethylene glycol powder (GLYCOLAX/MIRALAX) 17 GM/SCOOP powder Take 1 capful in 8 ounces of fluid each morning 255 g 0   Respiratory Therapy Supplies (FLUTTER) DEVI Use as directed (Patient taking differently: 1 each by Other route See admin instructions. Use as directed) 1 each 0   rosuvastatin (CRESTOR) 40 MG tablet Take 1 tablet (40 mg total) by mouth daily. (Patient taking differently: Take 40 mg by mouth at bedtime.) 90 tablet 3   sacubitril-valsartan (ENTRESTO) 24-26 MG Take 1 tablet by  mouth 2 (two) times daily. 60 tablet 11   Sennosides-Docusate Sodium (STOOL SOFTENER/LAXATIVE PO) Take 2 tablets by mouth daily.     Spacer/Aero-Holding Chambers (OPTICHAMBER DIAMOND) MISC optichamber [facility] (Patient taking differently: 1 each by Other route See admin instructions. optichamber [facility]) 1 each 0   spironolactone (ALDACTONE) 25 MG tablet Take 1 tablet (25 mg total) by mouth daily. 90 tablet 3   STIOLTO RESPIMAT 2.5-2.5 MCG/ACT AERS Inhale 2 puffs into the lungs daily.     tamsulosin (FLOMAX) 0.4 MG CAPS capsule TAKE ONE CAPSULE BY MOUTH DAILY AFTER SUPPER (Patient taking differently: Take 0.4 mg by mouth See admin instructions. TAKE ONE CAPSULE BY MOUTH DAILY AFTER SUPPER) 90 capsule 3   No current facility-administered medications for this visit.     Past Medical History:  Diagnosis Date   AICD (automatic cardioverter/defibrillator) present    [employer]   Aneurysm [facility])    a. Aneurysmal infrarenal aorta up to 33 mm on CT 10/2014, recommended f/u due 10/2017   Anginal pain (HCC)    Anxiety    Basal cell carcinoma of nose    S/P MOHS   Biliary acute pancreatitis    CAD (coronary artery disease)    a. s/p MI in 1994 with PCI to LAD at that time b. cath 10/2012 demonstrated EF 30%, inferior akinesis with mild hypokinesis of all walls, patent LAD and RCA stents; ostial PDA with 80-90% obstruction with medical therapy recommended    Chronic systolic CHF (congestive heart failure) (HCC)    EF 30 to 35 % as of 09/2014.    CKD (chronic kidney disease), stage III (HCC)    Complication of anesthesia 10/2014   "had to have defibrillator w/ERCP"   COPD (chronic obstructive pulmonary disease) (HCC)    a. followed by pulmonary, COPD GOLD stage II   Depression    Diverticulosis of colon 07/2014   noted on CT   GERD (gastroesophageal reflux disease)    Hiatal hernia    Hyperglycemia 10/2012   Hyperlipidemia    Hypertension    Myocardial infarction [facility]) 1994; 2011   Pneumonia  1946; 2015   Prostate enlargement 07/2014   observed on CT   Tobacco abuse    Ventricular tachycardia (HCC)    a. 08/2009 s/p BSX E110 Teligen 100 AICD, ser#: [id number];  b. 08/2008 VT req ATP - detection reprogrammed from 160 to 150. c. EPS and VT ablation by Dr. Ladona Ridgel 12/21/2014    ROS:   All systems reviewed and negative except as noted in the HPI.   Past Surgical History:  Procedure Laterality Date  BIOPSY  12/21/2017   Procedure: BIOPSY;  Surgeon: [name], MD;  Location: [facility];  Service: Endoscopy;;   CATARACT EXTRACTION W/ INTRAOCULAR LENS  IMPLANT, BILATERAL Bilateral ~ 2011   COLONOSCOPY     COLONOSCOPY WITH PROPOFOL N/A 12/21/2017   Procedure: COLONOSCOPY WITH PROPOFOL;  Surgeon: [name], MD;  Location: [facility] ENDOSCOPY;  Service: Endoscopy;  Laterality: N/A;   ELECTROPHYSIOLOGIC STUDY N/A 12/21/2014   Procedure: V Tach Ablation;  Surgeon: Marinus Maw, MD;  Location: [facility] INVASIVE CV LAB;  Service: Cardiovascular;  Laterality: N/A;   ERCP N/A 11/16/2014   Procedure: ENDOSCOPIC RETROGRADE CHOLANGIOPANCREATOGRAPHY (ERCP);  Surgeon: Louis Meckel, MD;  Location: [facility] ENDOSCOPY;  Service: Endoscopy;  Laterality: N/A;   ESOPHAGOGASTRODUODENOSCOPY (EGD) WITH PROPOFOL N/A 12/21/2017   Procedure: ESOPHAGOGASTRODUODENOSCOPY (EGD) WITH PROPOFOL;  Surgeon: [name], MD;  Location: [facility] ENDOSCOPY;  Service: Endoscopy;  Laterality: N/A;   EYE SURGERY     FOOT SURGERY Left 2005   "fixed bone that stuck out in my ankle area"   HEMORRHOID BANDING     ICD GENERATOR CHANGEOUT N/A 04/20/2022   Procedure: ICD GENERATOR CHANGEOUT;  Surgeon: Marinus Maw, MD;  Location[facility]VAS[facility] CV LAB;  Service: Cardiovascular;  Laterality: N/A;   IMPLANTABLE CARDIOVERTER DEFIBRILLATOR IMPLANT  09/06/09   BSX dual chamber ICD implanted in [place] for cardiac arrest 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   INGUINAL HERNIA REPAIR Right ~ 1995   INGUINAL HERNIA REPAIR Left 04/10/2022   Procedure: HERNIA REPAIR  INGUINAL ADULT;  Surgeon: Emelia Loron, MD;  Location: [facility] OR;  Service: General;  Laterality: Left;   LEFT HEART CATH AND CORONARY ANGIOGRAPHY N/A 03/19/2022   Procedure: LEFT HEART CATH AND CORONARY ANGIOGRAPHY;  Surgeon: Swaz[place], Peter M, MD;  Location: [facility]VAS[facility] CV LAB;  Service: Cardiovascular;  Laterality: N/A;   LEFT HEART CATHETERIZATION WITH CORONARY ANGIOGRAM N/A 11/25/2012   demonstrated EF 30%, inferior akinesis with mild hypokinesis of all walls, patent LAD and RCA stents; ostial PDA with 80-90% obstruction with medical therapy recommended   MOHS SURGERY  2008   nose, skin graft   POLYPECTOMY  12/21/2017   Procedure: POLYPECTOMY;  Surgeon: [name], MD;  Location: [facility] ENDOSCOPY;  Service: Endoscopy;;   RETINAL DETACHMENT SURGERY Right 2013   TENOLYSIS Right 12/21/2013   Procedure: TENOLYSIS FLEXOR CARPI RADIALIS ,DEBRIDEMENT RIGHT JOINT WRIST,DEBRIDEMENT SCAPHOTRAPEZIAL TRAPEZOID, REPAIR OF EXTENSOR HOOD;  Surgeon: [name], MD;  Location: [facility];  Service: Orthopedics;  Laterality: Right;   TOE SURGERY Right 09/2019   3rd toe/hammer toe   V-TACH ABLATION  12/21/2014   VIDEO BRONCHOSCOPY Bilateral 01/09/2016   Procedure: VIDEO BRONCHOSCOPY WITHOUT FLUORO;  Surgeon: [name], MD;  Location: [facility] ENDOSCOPY;  Service: Cardiopulmonary;  Laterality: Bilateral;     Family History  Problem Relation Age of Onset   Heart attack Brother    CAD Father    Hypertension Father    CAD Mother    Hypertension Mother    Hypertension Brother    Stroke Neg Hx      Social History   Socioeconomic History   Marital status: Married    Spouse name: Not on file   Number of children: Not on file   Years of education: Not on file   Highest education level: Not on file  Occupational History   Occupation: Retired  Tobacco Use   Smoking status: Former    Packs/day: 1.00    Years: 55.00  Additional pack years: 0.00    Total pack years: 55.00    Types:  Cigarettes   Smokeless tobacco: Never   Tobacco comments:    off/on, always ready to quit but does not work out  Building services engineer Use: Never used  Substance and Sexual Activity   Alcohol use: Yes    Alcohol/week: 0.0 standard drinks of alcohol    Comment: occ   Drug use: No   Sexual activity: Not Currently  Other Topics Concern   Not on file  Social History Narrative   Not on file   Social Determinants of Health   Financial Resource Strain: Medium Risk (03/06/2021)   Overall Financial Resource Strain (CARDIA)    Difficulty of Paying Living Expenses: Somewhat hard  Food Insecurity: No Food Insecurity (04/13/2022)   Hunger Vital Sign    Worried About Running Out of Food in the Last Year: Never true    Ran Out of Food in the Last Year: Never true  Transportation Needs: No Transportation Needs (04/13/2022)   PRAPARE - Administrator, Civil Service (Medical): No    Lack of Transportation (Non-Medical): No  Physical Activity: Insufficiently Active (03/06/2021)   Exercise Vital Sign    Days of Exercise per Week: 4 days    Minutes of Exercise per Session: 20 min  Stress: Stress Concern Present (03/06/2021)   [employer] of Occupational Health - Occupational Stress Questionnaire    Feeling of Stress : Rather much  Social Connections: Moderately Isolated (03/06/2021)   Social Connection and Isolation Panel [NHANES]    Frequency of Communication with Friends and Family: More than three times a week    Frequency of Social Gatherings with Friends and Family: Once a week    Attends Religious Services: Never    Database administrator or Organizations: No    Attends Banker Meetings: Never    Marital Status: Married  Catering manager Violence: Not At Risk (03/19/2022)   Humiliation, Afraid, Rape, and Kick questionnaire    Fear of Current or Ex-Partner: No    Emotionally Abused: No    Physically Abused: No    Sexually Abused: No     There were no vitals  taken for this visit.  Physical Exam:  Well appearing NAD HEENT: Unremarkable Neck:  No JVD, no thyromegally Lymphatics:  No adenopathy Back:  No CVA tenderness Lungs:  Clear HEART:  Regular rate rhythm, no murmurs, no rubs, no clicks Abd:  soft, positive bowel sounds, no organomegally, no rebound, no guarding Ext:  2 plus pulses, no edema, no cyanosis, no clubbing Skin:  No rashes no nodules Neuro:  CN II through XII intact, motor grossly intact  EKG - nsr with IVCD  DEVICE  Normal device function.  See PaceArt for details.   Assess/Plan:  VT - he continues to have occaisional episodes of slow VT. He will continue his current meds. ICD - he is s/p gen change and doing well. Chronic systolic heart failure -his symptoms are class 2. No change in his meds. PAF - he is maintaining NSR. No change.  Sharlot Gowda Blaise Palladino,MD

## 2022-07-23 NOTE — Patient Instructions (Signed)
Medication Instructions:  Your physician recommends that you continue on your current medications as directed. Please refer to the Current Medication list given to you today.  *If you need a refill on your cardiac medications before your next appointment, please call your pharmacy*  Lab Work: None ordered.  If you have labs (blood work) drawn today and your tests are completely normal, you will receive your results only by: MyChart Message (if you have MyChart) OR A paper copy in the mail If you have any lab test that is abnormal or we need to change your treatment, we will call you to review the results.  Testing/Procedures: None ordered.  Follow-Up: At [facility], you and your health needs are our priority.  As part of our continuing mission to provide you with exceptional heart care, we have created designated Provider Care Teams.  These Care Teams include your primary Cardiologist (physician) and Advanced Practice Providers (APPs -  Physician Assistants and Nurse Practitioners) who all work together to provide you with the care you need, when you need it.  Your next appointment:   1 year(s)  The format for your next appointment:   In Person  Provider:   Lewayne Bunting, MD{or one of the following Advanced Practice Providers on your designated Care Team:   Francis Dowse, [place] Casimiro Needle "Mardelle Matte" Lanna Poche, [place]  Remote monitoring is used to monitor your ICD from home. This monitoring reduces the number of office visits required to check your device to one time per year. It allows [place] to keep an eye on the functioning of your device to ensure it is working properly. You are scheduled for a device check from home on 10/19/22. You may send your transmission at any time that day. If you have a wireless device, the transmission will be sent automatically. After your physician reviews your transmission, you will receive a postcard with your next transmission date.

## 2022-07-28 ENCOUNTER — Encounter: Payer: Self-pay | Admitting: Cardiovascular Disease

## 2022-07-28 ENCOUNTER — Ambulatory Visit: Payer: Medicare Other | Attending: Cardiovascular Disease | Admitting: Cardiovascular Disease

## 2022-07-28 VITALS — BP 100/58 | HR 69 | Ht 72.0 in | Wt 190.2 lb

## 2022-07-28 DIAGNOSIS — I25810 Atherosclerosis of coronary artery bypass graft(s) without angina pectoris: Secondary | ICD-10-CM | POA: Diagnosis not present

## 2022-07-28 DIAGNOSIS — D6869 Other thrombophilia: Secondary | ICD-10-CM

## 2022-07-28 DIAGNOSIS — I480 Paroxysmal atrial fibrillation: Secondary | ICD-10-CM

## 2022-07-28 DIAGNOSIS — I10 Essential (primary) hypertension: Secondary | ICD-10-CM

## 2022-07-28 DIAGNOSIS — Z79899 Other long term (current) drug therapy: Secondary | ICD-10-CM

## 2022-07-28 DIAGNOSIS — Z5181 Encounter for therapeutic drug level monitoring: Secondary | ICD-10-CM

## 2022-07-28 DIAGNOSIS — I5042 Chronic combined systolic (congestive) and diastolic (congestive) heart failure: Secondary | ICD-10-CM

## 2022-07-28 DIAGNOSIS — I472 Ventricular tachycardia, unspecified: Secondary | ICD-10-CM | POA: Diagnosis not present

## 2022-07-28 DIAGNOSIS — I7121 Aneurysm of the ascending aorta, without rupture: Secondary | ICD-10-CM

## 2022-07-28 DIAGNOSIS — Z95810 Presence of automatic (implantable) cardiac defibrillator: Secondary | ICD-10-CM

## 2022-07-28 DIAGNOSIS — I714 Abdominal aortic aneurysm, without rupture, unspecified: Secondary | ICD-10-CM

## 2022-07-28 DIAGNOSIS — E785 Hyperlipidemia, unspecified: Secondary | ICD-10-CM

## 2022-07-28 NOTE — Progress Notes (Signed)
Cardiology Office Note:    Date:  07/28/2022   ID:  Steven, Guzman October 09, 1940, MRN [id number]  PCP:  Cleophus Molt   [facility] Providers Cardiologist:  Thurmon Fair, MD Electrophysiologist:  Lewayne Bunting, MD     Referring MD: Octavia Heir, NP   Chief Complaint  Patient presents with   Congestive Heart Failure    History of Present Illness:    Steven Guzman is a 82 y.o. male with a hx of longstanding problems with coronary artery disease (Inferior MI complicated by cardiac arrest in 1995,  PCI-stent LAD 2011, NSTEMI 2023 due to occlusion of OM 3 treated medically catheterization December 2023 showed patent stents in LAD and RCA, complete occlusion of OM 3, with chronic stable 80% stenosis of the PDA) and ischemic cardiomyopathy (most recent echo December 2023 LVEF 25-30% with inferior and inferolateral akinesis), previous VT requiring ICD implantation Conservation officer, historic buildings implanted 2010 in [place], generator change [facility] Momentum 04/20/2022) and VT ablation in 2016, on amiodarone therapy, paroxysmal atrial fibrillation on chronic Eliquis anticoagulation, small AAA, CKD stage III, COPD, hypertension, hypercholesterolemia, hiatal hernia/GERD, depression, who has recently had some problems with worsening exertional dyspnea.  He was cared for by Dr. Verdis Prime for many years.  His electrophysiologist is Dr. Lewayne Bunting.  The patient's wife, Steven Guzman is my patient as well.  He had a right hip fracture November 2023 treated with hemiarthroplasty.  He was hospitalized with chest pain in December 2023 and had a small non-STEMI.  Cardiac catheterization showed the culprit lesion to be complete occlusion of OM 3 with otherwise stable anatomy (patent stents in LAD and RCA and unchanged 80% ostial stenosis of the right PDA).  LVEDP at catheterization was mildly elevated at 16 mmHg (he weighed  roughly 190 pounds on that day).  EF dropped further to 25-30%.  He was  started on treatment with Sherryll Burger and Jardiance at that time.  Spironolactone was subsequently added at his follow-up visit in the office.  In January he underwent uncomplicated surgical repair of a very large, partially incarcerated left inguinal hernia containing a large portion of the sigmoid colon.  His biggest complaint continues to be exertional dyspnea.  He describes bendopnea.  He does not have orthopnea or PND.  He usually does not have any difficulty walking on level ground, but was short of breath climbing about 8 steps while carrying a grocery bag.  He also is quite hoarse.  He has a longstanding history of GERD that is currently asymptomatic while taking PPI for over 20 years.  He has not had recent problems with lower extremity edema.  He has extensive easy bruising.    He denies chest pain at rest or with activity, palpitations, dizziness, syncope, defibrillator discharges, falls, bleeding problems he does get dizzy if he stands up too quickly.  His systolic blood pressure is often in the 90s.  Mild tremor in his hands unchanged.  No other focal neurological complaints.  He infrequently takes loop diuretics (he has not had any in a week).  He forgets to weigh himself.  We had intended for him to take furosemide on days when his home weight is over 185 pounds.  During his most recent episode of heart failure exacerbation his weight peaked at 204 pounds.  Earlier this year his minimum weight went down to 182 pounds (our office scale shows 5 pounds more, so today his home weight would probably be around 185 pounds).  His most recent echocardiogram shows severely depressed left ventricular systolic function which has not really changed, but shows Doppler parameters consistent with normal left heart filling pressures.  Most recent proBNP level was quite normal at 250.  Is a that he is followed by Dr. Ladona Ridgel.  He takes amiodarone due to history of ventricular tachycardia (his ICD continues to  show occasional episodes of slow VT).  No recent atrial fibrillation.  He is taking Eliquis for stroke prevention.  His heart failure regimen is very much state-of-the-art including maximum tolerated dose Entresto, a small dose of carvedilol, Jardiance, spironolactone.    He has a pulmonary specialist in [facility] for COPD and is planning to see an ENT specialist due to his hoarseness.  Past Medical History:  Diagnosis Date   AICD (automatic cardioverter/defibrillator) present    [employer]   Aneurysm [facility])    a. Aneurysmal infrarenal aorta up to 33 mm on CT 10/2014, recommended f/u due 10/2017   Anginal pain (HCC)    Anxiety    Basal cell carcinoma of nose    S/P MOHS   Biliary acute pancreatitis    CAD (coronary artery disease)    a. s/p MI in 1994 with PCI to LAD at that time b. cath 10/2012 demonstrated EF 30%, inferior akinesis with mild hypokinesis of all walls, patent LAD and RCA stents; ostial PDA with 80-90% obstruction with medical therapy recommended    Chronic systolic CHF (congestive heart failure) (HCC)    EF 30 to 35 % as of 09/2014.    CKD (chronic kidney disease), stage III (HCC)    Complication of anesthesia 10/2014   "had to have defibrillator w/ERCP"   COPD (chronic obstructive pulmonary disease) (HCC)    a. followed by pulmonary, COPD GOLD stage II   Depression    Diverticulosis of colon 07/2014   noted on CT   GERD (gastroesophageal reflux disease)    Hiatal hernia    Hyperglycemia 10/2012   Hyperlipidemia    Hypertension    Myocardial infarction [facility]) 1994; 2011   Pneumonia 1946; 2015   Prostate enlargement 07/2014   observed on CT   Tobacco abuse    Ventricular tachycardia (HCC)    a. 08/2009 s/p BSX E110 Teligen 100 AICD, ser#: [id number];  b. 08/2008 VT req ATP - detection reprogrammed from 160 to 150. c. EPS and VT ablation by Dr. Ladona Ridgel 12/21/2014    Past Surgical History:  Procedure Laterality Date   BIOPSY  12/21/2017   Procedure: BIOPSY;   Surgeon: [name], MD;  Location: [facility] ENDOSCOPY;  Service: Endoscopy;;   CATARACT EXTRACTION W/ INTRAOCULAR LENS  IMPLANT, BILATERAL Bilateral ~ 2011   COLONOSCOPY     COLONOSCOPY WITH PROPOFOL N/A 12/21/2017   Procedure: COLONOSCOPY WITH PROPOFOL;  Surgeon: [name], MD;  Location: [facility] ENDOSCOPY;  Service: Endoscopy;  Laterality: N/A;   ELECTROPHYSIOLOGIC STUDY N/A 12/21/2014   Procedure: V Tach Ablation;  Surgeon: Marinus Maw, MD;  Location: [facility] INVASIVE CV LAB;  Service: Cardiovascular;  Laterality: N/A;   ERCP N/A 11/16/2014   Procedure: ENDOSCOPIC RETROGRADE CHOLANGIOPANCREATOGRAPHY (ERCP);  Surgeon: Louis Meckel, MD;  Location[facility] ENDOSCOPY;  Service: Endoscopy;  Laterality: N/A;   ESOPHAGOGASTRODUODENOSCOPY (EGD) WITH PROPOFOL N/A 12/21/2017   Procedure: ESOPHAGOGASTRODUODENOSCOPY (EGD) WITH PROPOFOL;  Surgeon: [name], MD;  Location: [facility] ENDOSCOPY;  Service: Endoscopy;  Laterality: N/A;   EYE SURGERY     FOOT SURGERY Left 2005   "fixed bone that stuck  out in my ankle area"   HEMORRHOID BANDING     ICD GENERATOR CHANGEOUT N/A 04/20/2022   Procedure: ICD GENERATOR CHANGEOUT;  Surgeon: Marinus Maw, MD;  Location[facility] INVASIVE CV LAB;  Service: Cardiovascular;  Laterality: N/A;   IMPLANTABLE CARDIOVERTER DEFIBRILLATOR IMPLANT  09/06/09   BSX dual chamber ICD implanted in [place] for cardiac arrest 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   INGUINAL HERNIA REPAIR Right ~ 1995   INGUINAL HERNIA REPAIR Left 04/10/2022   Procedure: HERNIA REPAIR INGUINAL ADULT;  Surgeon: Emelia Loron, MD;  Location: [facility] OR;  Service: General;  Laterality: Left;   LEFT HEART CATH AND CORONARY ANGIOGRAPHY N/A 03/19/2022   Procedure: LEFT HEART CATH AND CORONARY ANGIOGRAPHY;  Surgeon: Swaziland, Peter M, MD;  Location: [facility] INVASIVE CV LAB;  Service: Cardiovascular;  Laterality: N/A;   LEFT HEART CATHETERIZATION WITH CORONARY ANGIOGRAM N/A 11/25/2012   demonstrated EF 30%, inferior akinesis with mild hypokinesis  of all walls, patent LAD and RCA stents; ostial PDA with 80-90% obstruction with medical therapy recommended   MOHS SURGERY  2008   nose, skin graft   POLYPECTOMY  12/21/2017   Procedure: POLYPECTOMY;  Surgeon: [name], MD;  Location: [facility] ENDOSCOPY;  Service: Endoscopy;;   RETINAL DETACHMENT SURGERY Right 2013   TENOLYSIS Right 12/21/2013   Procedure: TENOLYSIS FLEXOR CARPI RADIALIS ,DEBRIDEMENT RIGHT JOINT WRIST,DEBRIDEMENT SCAPHOTRAPEZIAL TRAPEZOID, REPAIR OF EXTENSOR HOOD;  Surgeon: [name], MD;  Location:  SURGERY CENTER;  Service: Orthopedics;  Laterality: Right;   TOE SURGERY Right 09/2019   3rd toe/hammer toe   V-TACH ABLATION  12/21/2014   VIDEO BRONCHOSCOPY Bilateral 01/09/2016   Procedure: VIDEO BRONCHOSCOPY WITHOUT FLUORO;  Surgeon: [name], MD;  Location: [facility] ENDOSCOPY;  Service: Cardiopulmonary;  Laterality: Bilateral;    Current Medications: Current Meds  Medication Sig   amiodarone (PACERONE) 200 MG tablet TAKE ONE TABLET BY MOUTH DAILY   apixaban (ELIQUIS) 5 MG TABS tablet Take 1 tablet (5 mg total) by mouth 2 (two) times daily.   Artificial Tear Solution (SOOTHE XP OP) Place 2 drops into both eyes daily as needed (dry eyes).   ascorbic acid (VITAMIN C) 500 MG tablet Take 1 tablet by mouth daily.   aspirin EC 81 MG tablet Take 1 tablet (81 mg total) by mouth daily. Swallow whole.   busPIRone (BUSPAR) 15 MG tablet Take 15 mg by mouth 2 (two) times daily. Patient taking 10 mg by mouth twice daily   calcium citrate (CALCITRATE - DOSED IN MG ELEMENTAL CALCIUM) 950 (200 Ca) MG tablet Take 200 mg of elemental calcium by mouth daily.   carvedilol (COREG) 3.125 MG tablet Take 1 tablet (3.125 mg total) by mouth 2 (two) times daily.   cetirizine (ZYRTEC) 10 MG tablet Take 1 tablet by mouth daily.   Cholecalciferol 50 MCG (2000 UT) TABS Take 1 tablet by mouth daily.   Choline Fenofibrate 135 MG capsule Take 135 mg by mouth daily.   Cyanocobalamin (VITAMIN  B-12) 5000 MCG SUBL Place under the tongue daily.   Docusate Sodium (DSS) 100 MG CAPS Take 2 capsules by mouth daily in the afternoon. 2-3 capsules daily   empagliflozin (JARDIANCE) 10 MG TABS tablet Take 1 tablet (10 mg total) by mouth daily.   escitalopram (LEXAPRO) 10 MG tablet Take 1 tablet by mouth daily.   Fenofibric Acid 35 MG TABS Take 35 mg by mouth daily.   fluticasone (FLONASE) 50 MCG/ACT nasal spray Place 2 sprays into both nostrils daily.  furosemide (LASIX) 20 MG tablet Take 1 tablet (20 mg total) by mouth daily as needed (Take any day that your weight is over 185).   gabapentin (NEURONTIN) 300 MG capsule Take 1 capsule (300 mg total) by mouth 2 (two) times daily.   guaiFENesin (MUCINEX) 600 MG 12 hr tablet Take 1 tablet by mouth 2 (two) times daily.   Ipratropium-Albuterol (COMBIVENT RESPIMAT) 20-100 MCG/ACT AERS respimat Inhale 1 puff into the lungs every 6 (six) hours. Shortness of breath or wheezing   IRON PO Take 1 tablet by mouth daily.   MAGNESIUM-OXIDE 400 (241.3 Mg) MG tablet TAKE 1 TABSULE BY MOUTH DAILY (Patient taking differently: Take 400 mg by mouth daily.)   mexiletine (MEXITIL) 200 MG capsule Take 1 capsule (200 mg total) by mouth 2 (two) times daily.   Multiple Vitamins-Minerals (CENTRUM ADULTS PO) Take by mouth daily.   nitroGLYCERIN (NITROSTAT) 0.4 MG SL tablet Place 1 tablet (0.4 mg total) under the tongue every 5 (five) minutes as needed for chest pain. DISSOLVE 1 TABLET UNDER THE TONGUE EVERY 5 MINUTES FOR 3 DOSES   omeprazole (PRILOSEC) 20 MG capsule Take 20 mg by mouth daily.    polyethylene glycol powder (GLYCOLAX/MIRALAX) 17 GM/SCOOP powder Take 1 capful in 8 ounces of fluid each morning   Respiratory Therapy Supplies (FLUTTER) DEVI Use as directed (Patient taking differently: 1 each by Other route See admin instructions. Use as directed)   rosuvastatin (CRESTOR) 40 MG tablet Take 1 tablet (40 mg total) by mouth daily. (Patient taking differently: Take 40 mg  by mouth at bedtime.)   sacubitril-valsartan (ENTRESTO) 24-26 MG Take 1 tablet by mouth 2 (two) times daily.   Sennosides-Docusate Sodium (STOOL SOFTENER/LAXATIVE PO) Take 2 tablets by mouth daily.   Spacer/Aero-Holding Chambers (OPTICHAMBER DIAMOND) MISC optichamber [facility] (Patient taking differently: 1 each by Other route See admin instructions. optichamber [facility])   spironolactone (ALDACTONE) 25 MG tablet Take 1 tablet (25 mg total) by mouth daily.   STIOLTO RESPIMAT 2.5-2.5 MCG/ACT AERS Inhale 2 puffs into the lungs daily.   tamsulosin (FLOMAX) 0.4 MG CAPS capsule TAKE ONE CAPSULE BY MOUTH DAILY AFTER SUPPER (Patient taking differently: Take 0.4 mg by mouth See admin instructions. TAKE ONE CAPSULE BY MOUTH DAILY AFTER SUPPER)     Allergies:   Sulfa antibiotics   Social History   Socioeconomic History   Marital status: Married    Spouse name: Not on file   Number of children: Not on file   Years of education: Not on file   Highest education level: Not on file  Occupational History   Occupation: Retired  Tobacco Use   Smoking status: Former    Packs/day: 1.00    Years: 55.00    Additional pack years: 0.00    Total pack years: 55.00    Types: Cigarettes   Smokeless tobacco: Never   Tobacco comments:    off/on, always ready to quit but does not work out  Building services engineer Use: Never used  Substance and Sexual Activity   Alcohol use: Yes    Alcohol/week: 0.0 standard drinks of alcohol    Comment: occ   Drug use: No   Sexual activity: Not Currently  Other Topics Concern   Not on file  Social History Narrative   Not on file   Social Determinants of Health   Financial Resource Strain: Medium Risk (03/06/2021)   Overall Financial Resource Strain (CARDIA)    Difficulty of Paying Living Expenses: Somewhat hard  Food Insecurity: No Food Insecurity (04/13/2022)   Hunger Vital Sign    Worried About Running Out of Food in the Last Year: Never true    Ran Out of Food in the Last  Year: Never true  Transportation Needs: No Transportation Needs (04/13/2022)   PRAPARE - Administrator, Civil Service (Medical): No    Lack of Transportation (Non-Medical): No  Physical Activity: Insufficiently Active (03/06/2021)   Exercise Vital Sign    Days of Exercise per Week: 4 days    Minutes of Exercise per Session: 20 min  Stress: Stress Concern Present (03/06/2021)   [employer] of Occupational Health - Occupational Stress Questionnaire    Feeling of Stress : Rather much  Social Connections: Moderately Isolated (03/06/2021)   Social Connection and Isolation Panel [NHANES]    Frequency of Communication with Friends and Family: More than three times a week    Frequency of Social Gatherings with Friends and Family: Once a week    Attends Religious Services: Never    Database administrator or Organizations: No    Attends Engineer, structural: Never    Marital Status: Married     Family History: The patient's family history includes CAD in his father and mother; Heart attack in his brother; Hypertension in his brother, father, and mother. There is no history of Stroke.  ROS:   Please see the history of present illness.     All other systems reviewed and are negative.  EKGs/Labs/Other Studies Reviewed:    The following studies were reviewed today: ECHO 07/02/2022   1. Left ventricular ejection fraction, by estimation, is 30%. The left  ventricle has moderate to severely decreased function. The left ventricle  demonstrates regional wall motion abnormalities with basal inferoseptal  akinesis, basal to mid inferior  akinesis, inferolateral akinesis, anterolateral hypokinesis. The left  ventricular internal cavity size was mildly to moderately dilated. There  is mild concentric left ventricular hypertrophy. Left ventricular  diastolic parameters are consistent with  Grade I diastolic dysfunction (impaired relaxation).   2. Right ventricular systolic  function is normal. The right ventricular  size is mildly enlarged. Tricuspid regurgitation signal is inadequate for  assessing PA pressure.   3. Left atrial size was mildly dilated.   4. Right atrial size was mildly dilated.   5. The mitral valve is normal in structure. Trivial mitral valve  regurgitation. No evidence of mitral stenosis.   6. The aortic valve is tricuspid. There is moderate calcification of the  aortic valve. Aortic valve regurgitation is trivial. No aortic stenosis is  present.   7. The inferior vena cava is normal in size with greater than 50%  respiratory variability, suggesting right atrial pressure of 3 mmHg.    Cardiac cath 03/19/2022     Mid LM to Dist LM lesion is 20% stenosed.   Ost LAD to Prox LAD lesion is 25% stenosed.   Prox LAD to Mid LAD lesion is 35% stenosed.   3rd Mrg lesion is 100% stenosed.   Prox RCA to Mid RCA lesion is 40% stenosed.   RPDA lesion is 80% stenosed.   There is mild left ventricular systolic dysfunction.   LV end diastolic pressure is mildly elevated.   The left ventricular ejection fraction is 45-50% by visual estimate.   2 vessel obstructive CAD - 100% occlusion of distal OM3 appears to be the culprit. The ostial PDA lesion is unchanged from 2014 Patent stents in the LAD and  RCA Mild LV dysfunction with inferior wall motion abnormality Mildly elevated LVEDP 16 mm Hg   Plan: recommend medical therapy   EKG:  EKG is  ordered today.  The ekg ordered today demonstrates normal sinus rhythm, minor nonspecific intraventricular conduction delay, prolonged QTc 528 ms.  There are no acute ischemic repolarization abnormalities.  Recent Labs: 03/18/2022: B Natriuretic Peptide 90.1 04/08/2022: ALT 23 04/11/2022: Magnesium 2.1 06/04/2022: Hemoglobin 11.4; NT-Pro BNP 250; Platelets 122; TSH 2.170 07/20/2022: BUN 5; Creatinine, Ser 0.79; Potassium 4.3; Sodium 140  Recent Lipid Panel    Component Value Date/Time   CHOL 162 03/19/2022  0205   CHOL 117 07/14/2021 1026   TRIG 75 03/19/2022 0205   HDL 44 03/19/2022 0205   HDL 47 07/14/2021 1026   CHOLHDL 3.7 03/19/2022 0205   VLDL 15 03/19/2022 0205   LDLCALC 103 (H) 03/19/2022 0205   LDLCALC 53 07/14/2021 1026   LDLCALC 67 03/01/2020 1344     Risk Assessment/Calculations:                Physical Exam:    VS:  BP (!) 100/58 (BP Location: Left Arm, Patient Position: Sitting, Cuff Size: Normal)   Pulse 69   Ht 6' (1.829 m)   Wt 190 lb 3.2 oz (86.3 kg)   SpO2 96%   BMI 25.80 kg/m     Wt Readings from Last 3 Encounters:  07/28/22 190 lb 3.2 oz (86.3 kg)  07/23/22 188 lb 6.4 oz (85.5 kg)  06/04/22 196 lb (88.9 kg)     GEN: Appears comfortable, speaking in full sentences without interruption.  Well nourished, well developed in no acute distress.  Healthy left subclavian defibrillator site. HEENT: Normal NECK: No JVD; No carotid bruits LYMPHATICS: No lymphadenopathy CARDIAC: RRR, no murmurs, rubs, gallops RESPIRATORY:  Clear to auscultation without rales, wheezing or rhonchi  ABDOMEN: Soft, non-tender, non-distended MUSCULOSKELETAL: 1+ symmetrical lower extremity edema, roughly halfway up the calves; No deformity  SKIN: Warm and dry NEUROLOGIC:  Alert and oriented x 3, nonfocal other than mild intentional tremor in both hands PSYCHIATRIC:  Normal affect   ASSESSMENT:    1. Chronic combined systolic and diastolic heart failure (HCC)   2. Coronary artery disease involving coronary bypass graft of native heart without angina pectoris   3. Ventricular tachycardia (HCC)   4. Encounter for monitoring amiodarone therapy   5. Paroxysmal atrial fibrillation (HCC)   6. Acquired thrombophilia (HCC)   7. Hyperlipidemia LDL goal <70   8. Essential hypertension   9. ICD (implantable cardioverter-defibrillator) in place   10. Abdominal aortic aneurysm (AAA) without rupture, unspecified part (HCC)   11. Aneurysm of ascending aorta without rupture (HCC)      PLAN:    In order of problems listed above:  CHF: He has severely depressed left ventricular systolic function.  NYHA functional class II-3A.  As far as I can tell he is euvolemic.  His most recent proBNP was completely normal, his ICD does not show evidence of fluid overload, his most recent echocardiogram shows findings consistent with normal left heart filling pressures.  He does have bendopnea which is usually considered for specific for heart disease/low cardiac output.  I do not think he needs more diuretics and I am not convinced that his shortness of breath is entirely due to heart disease.  Especially since he is taking amiodarone I think it is worthwhile repeating pulmonary function test with his specialist in [place] and I be curious to see if  his ENT physician thinks that his hoarseness may be due to chronic silent aspiration.  He is taking Entresto/SGLT2 inhibitor/carvedilol/spironolactone and the maximum tolerated doses.  His blood pressure is borderline low.  Reinforced importance of daily weight monitoring and the potential usefulness of furosemide when his weight increases.  Has discussed Barostim a little bit in the EP clinic.  If we do not find a pulmonary or ENT abnormality that could be contributing to shortness of breath and I think a Barostim implant  would be a reasonable next step. CAD: Currently asymptomatic/angina free.  Recent small non-STEMI due to occlusion of a small oblique marginal branch.  He currently does not have any angina pectoris.  He is on apixaban and aspirin.  Stop aspirin after 12 months following his MI.   VT: Asymptomatic, infrequent and relatively slow.  He is on amiodarone therapy. Amiodarone: Normal LFTs and normal TSH last month.  Recommend that he should have repeat pulmonary function tests when he sees a specialist in [place]. AFib: None documented recently, at least not since his ICD generator change in January 2023. Anticoagulation:  Hemoglobin was 10.7 in January, up to 13.8, now back down to 10.8 on labs performed last month.  Pattern is not necessarily suggestive of iron deficiency. HLP:  Target LDL less than 70, ideally less than 55.  On rosuvastatin, due to be rechecked. HTN: Will not allow additional titration of Entresto. CKD: Repeated labs this year have shown a creatinine of 0.6-0.8 range.  He does not have chronic kidney disease. ICD: Normal device function, followed by Dr. Ladona Ridgel. TAAA and AAA: Most recent measurement of his ascending aortic aneurysm by CT in November 2023 was 4.5 cm.  The distal aortic arch and ascending aorta are also involved and he has a small infrarenal AAA measuring 3.4 cm.  These values have been relatively stable over time.  Will repeat scans in November 2024 Tremor: Has fine resting and intentional tremor.  Suspect that this is essential tremor.  He does not have any other features to suggest Parkinson's disease.  May have had transient worsening due to inadvertent withdrawal from escitalopram.  Need to monitor periodically make sure this is not worsened by continue treatment with amiodarone.           Medication Adjustments/Labs and Tests Ordered: Current medicines are reviewed at length with the patient today.  Concerns regarding medicines are outlined above.  No orders of the defined types were placed in this encounter.  No orders of the defined types were placed in this encounter.   Patient Instructions  Medication Instructions:  No changes *If you need a refill on your cardiac medications before your next appointment, please call your pharmacy*  Testing/Procedures: Speak with your Pulmonologist and request Pulmonary Function Tests due to being on Amiodarone; we need a baseline of pulmonary function.    Follow-Up: At [facility], you and your health needs are our priority.  As part of our continuing mission to provide you with exceptional heart care, we have created  designated Provider Care Teams.  These Care Teams include your primary Cardiologist (physician) and Advanced Practice Providers (APPs -  Physician Assistants and Nurse Practitioners) who all work together to provide you with the care you need, when you need it.  We recommend signing up for the patient portal called "MyChart".  Sign up information is provided on this After Visit Summary.  MyChart is used to connect with patients for Virtual Visits (Telemedicine).  Patients are able  to view lab/test results, encounter notes, upcoming appointments, etc.  Non-urgent messages can be sent to your provider as well.   To learn more about what you can do with MyChart, go to [email or web address].    Your next appointment:   6 month(s)  Provider:   Thurmon Fair, MD        Signed, Thurmon Fair, MD  07/28/2022 11:27 AM    [facility]

## 2022-07-28 NOTE — Patient Instructions (Signed)
Medication Instructions:  No changes *If you need a refill on your cardiac medications before your next appointment, please call your pharmacy*  Testing/Procedures: Speak with your Pulmonologist and request Pulmonary Function Tests due to being on Amiodarone; we need a baseline of pulmonary function.    Follow-Up: At [facility], you and your health needs are our priority.  As part of our continuing mission to provide you with exceptional heart care, we have created designated Provider Care Teams.  These Care Teams include your primary Cardiologist (physician) and Advanced Practice Providers (APPs -  Physician Assistants and Nurse Practitioners) who all work together to provide you with the care you need, when you need it.  We recommend signing up for the patient portal called "MyChart".  Sign up information is provided on this After Visit Summary.  MyChart is used to connect with patients for Virtual Visits (Telemedicine).  Patients are able to view lab/test results, encounter notes, upcoming appointments, etc.  Non-urgent messages can be sent to your provider as well.   To learn more about what you can do with MyChart, go to [email or web address].    Your next appointment:   6 month(s)  Provider:   Thurmon Fair, MD

## 2022-08-03 DIAGNOSIS — J383 Other diseases of vocal cords: Secondary | ICD-10-CM | POA: Insufficient documentation

## 2022-09-01 ENCOUNTER — Telehealth: Payer: Self-pay | Admitting: Cardiovascular Disease

## 2022-09-01 NOTE — Telephone Encounter (Signed)
Patient states he is having shortness of breath more frequently with activity. He went to get his pulse ox and by the time he came back to the phobe he was short of breath. He relaxed and caught his breath and checked O2 was at 93%. He denies chest pain, edema, headache, dizzinness, nausea or vomiting. He does not think he had any weight gain. Made appointment with DOD for tomorrow at [facility]. Discussed ED precautions.

## 2022-09-01 NOTE — Telephone Encounter (Signed)
Pt c/o Shortness Of Breath: STAT if SOB developed within the last 24 hours or pt is noticeably SOB on the phone  1. Are you currently SOB (can you hear that pt is SOB on the phone)? Yes, a little.   2. How long have you been experiencing SOB? Since last week   3. Are you SOB when sitting or when up moving around? Moving around   4. Are you currently experiencing any other symptoms? Fatigue and a little lightheadedness

## 2022-09-02 ENCOUNTER — Encounter (HOSPITAL_BASED_OUTPATIENT_CLINIC_OR_DEPARTMENT_OTHER): Payer: Self-pay | Admitting: Cardiology

## 2022-09-02 ENCOUNTER — Ambulatory Visit (INDEPENDENT_AMBULATORY_CARE_PROVIDER_SITE_OTHER): Payer: Medicare Other | Admitting: Cardiology

## 2022-09-02 VITALS — BP 106/68 | HR 69 | Ht 72.0 in | Wt 185.9 lb

## 2022-09-02 DIAGNOSIS — R0602 Shortness of breath: Secondary | ICD-10-CM | POA: Diagnosis not present

## 2022-09-02 DIAGNOSIS — I5042 Chronic combined systolic (congestive) and diastolic (congestive) heart failure: Secondary | ICD-10-CM

## 2022-09-02 DIAGNOSIS — J449 Chronic obstructive pulmonary disease, unspecified: Secondary | ICD-10-CM

## 2022-09-02 DIAGNOSIS — I2510 Atherosclerotic heart disease of native coronary artery without angina pectoris: Secondary | ICD-10-CM

## 2022-09-02 DIAGNOSIS — I255 Ischemic cardiomyopathy: Secondary | ICD-10-CM

## 2022-09-02 NOTE — Progress Notes (Signed)
Cardiology Office Note:    Date:  09/02/2022   ID:  Steven Guzman, DOB Jun 08, 1940, MRN [id number]  PCP:  Laroy Apple, PA-C  Cardiologist:  Thurmon Fair, MD  Referring MD: Laroy Apple, PA-C   CC: urgent visit for shortness of breath  History of Present Illness:    Steven Guzman is a 82 y.o. male with a hx of longstanding problems with coronary artery disease (Inferior MI complicated by cardiac arrest in 1995, PCI-stent LAD 2011, NSTEMI 2023 due to occlusion of OM 3 treated medically catheterization December 2023 showed patent stents in LAD and RCA, complete occlusion of OM 3, with chronic stable 80% stenosis of the PDA) and ischemic cardiomyopathy (most recent echo December 2023 LVEF 25-30% with inferior and inferolateral akinesis), previous VT requiring ICD implantation Conservation officer, historic buildings implanted 2010 in [place], generator change [facility] Momentum 04/20/2022) and VT ablation in 2016, on amiodarone therapy, paroxysmal atrial fibrillation on chronic Eliquis anticoagulation, small AAA, CKD stage III, COPD, hypertension, hypercholesterolemia, hiatal hernia/GERD, depression, who is seen as a new consult at the request of Laroy Apple, PA-C for the evaluation and management of shortness of breath.  Cardiac history:  Follows with cardiology Dr. Royann Shivers and electrophysiology Dr. Ladona Ridgel, notes reviewed, extensive history summarized above. Also follows with pulmonary specialist in [facility] for COPD. Planned to establish with ENT specialist due to issues with hoarseness. Last seen by Dr. Royann Shivers 07/28/2022 where he complained of dyspnea on exertion and described bendopnea. Noted dizziness with standing up too quickly; blood pressures are frequently in the 90s systolic. Infrequently takes loop diuretics. Peak weight of 204 lbs during last heart failure exacerbation. It was not felt that he needed more diuretics or that his shortness of breath was entirely of cardiac etiology. If no  abnormalities found with pulmonary or ENT, planned to consider Barostim implant with EP.  Today, he confirms having issues with dyspnea on exertion such as moving with lifting objects. Within the past week or so, he doesn't have to be doing much at all to be short of breath. He may be sitting still when it occurs. He has a nebulizer which he uses as needed: his symptoms will improve for maybe 1-2 hours. He is leaning towards Barostim implant.  When he first lies down at night, he is short of breath. He denies any PND; however, his wife has said that he appears to stop breathing at night. This never wakes him, and he doesn't believe that he stops breathing. He has had several sleep studies in the past. He sleeps using 2 pillows at night, hasn't needed to use more.   Intermittently he weighs himself. He has been instructed to take furosemide if he is over 185 lbs. For a long while he was 184 lbs, so he hasn't really needed to take the diuretic. This morning he was 182 lbs at home. Recently he only took it once last week as his wife had noticed that his legs appeared swollen. He did not feel any better after taking the furosemide. He tries to wear compression stockings as much as he is able.  He has a history of smoking for many years, and has been diagnosed with COPD. He endorses clear/whitish sputum production. Sometimes he does cough severely enough that he is unable to breathe. He complains of some chills over the past few days, but he doesn't feel sick at this time.  He woke up yesterday and felt like he had a sprained left ankle.  Currently swollen. He doesn't recall any recent injuries to his ankle.  He denies any palpitations, chest pain, lightheadedness, headaches, syncope.   Past Medical History:  Diagnosis Date   AICD (automatic cardioverter/defibrillator) present    [employer]   Aneurysm [facility])    a. Aneurysmal infrarenal aorta up to 33 mm on CT 10/2014, recommended f/u due 10/2017    Anginal pain (HCC)    Anxiety    Basal cell carcinoma of nose    S/P MOHS   Biliary acute pancreatitis    CAD (coronary artery disease)    a. s/p MI in 1994 with PCI to LAD at that time b. cath 10/2012 demonstrated EF 30%, inferior akinesis with mild hypokinesis of all walls, patent LAD and RCA stents; ostial PDA with 80-90% obstruction with medical therapy recommended    Chronic systolic CHF (congestive heart failure) (HCC)    EF 30 to 35 % as of 09/2014.    CKD (chronic kidney disease), stage III (HCC)    Complication of anesthesia 10/2014   "had to have defibrillator w/ERCP"   COPD (chronic obstructive pulmonary disease) (HCC)    a. followed by pulmonary, COPD GOLD stage II   Depression    Diverticulosis of colon 07/2014   noted on CT   GERD (gastroesophageal reflux disease)    Hiatal hernia    Hyperglycemia 10/2012   Hyperlipidemia    Hypertension    Myocardial infarction [facility]) 1994; 2011   Pneumonia 1946; 2015   Prostate enlargement 07/2014   observed on CT   Tobacco abuse    Ventricular tachycardia (HCC)    a. 08/2009 s/p BSX E110 Teligen 100 AICD, ser#: [id number];  b. 08/2008 VT req ATP - detection reprogrammed from 160 to 150. c. EPS and VT ablation by Dr. Ladona Ridgel 12/21/2014    Past Surgical History:  Procedure Laterality Date   BIOPSY  12/21/2017   Procedure: BIOPSY;  Surgeon: [name], MD;  Location: [facility] ENDOSCOPY;  Service: Endoscopy;;   CATARACT EXTRACTION W/ INTRAOCULAR LENS  IMPLANT, BILATERAL Bilateral ~ 2011   COLONOSCOPY     COLONOSCOPY WITH PROPOFOL N/A 12/21/2017   Procedure: COLONOSCOPY WITH PROPOFOL;  Surgeon: [name], MD;  Location: [facility] ENDOSCOPY;  Service: Endoscopy;  Laterality: N/A;   ELECTROPHYSIOLOGIC STUDY N/A 12/21/2014   Procedure: V Tach Ablation;  Surgeon: Marinus Maw, MD;  Location: [facility] INVASIVE CV LAB;  Service: Cardiovascular;  Laterality: N/A;   ERCP N/A 11/16/2014   Procedure: ENDOSCOPIC RETROGRADE CHOLANGIOPANCREATOGRAPHY (ERCP);  Surgeon:  Louis Meckel, MD;  Location: [facility] ENDOSCOPY;  Service: Endoscopy;  Laterality: N/A;   ESOPHAGOGASTRODUODENOSCOPY (EGD) WITH PROPOFOL N/A 12/21/2017   Procedure: ESOPHAGOGASTRODUODENOSCOPY (EGD) WITH PROPOFOL;  Surgeon: [name], MD;  Location: [facility] ENDOSCOPY;  Service: Endoscopy;  Laterality: N/A;   EYE SURGERY     FOOT SURGERY Left 2005   "fixed bone that stuck out in my ankle area"   HEMORRHOID BANDING     ICD GENERATOR CHANGEOUT N/A 04/20/2022   Procedure: ICD GENERATOR CHANGEOUT;  Surgeon: Marinus Maw, MD;  Location: [facility] INVASIVE CV LAB;  Service: Cardiovascular;  Laterality: N/A;   IMPLANTABLE CARDIOVERTER DEFIBRILLATOR IMPLANT  09/06/09   BSX dual chamber ICD implanted in [place] for cardiac arrest 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   INGUINAL HERNIA REPAIR Right ~ 1995   INGUINAL HERNIA REPAIR Left 04/10/2022   Procedure: HERNIA REPAIR INGUINAL ADULT;  Surgeon: Emelia Loron, MD;  Location[facility];  Service: General;  Laterality:  Left;   LEFT HEART CATH AND CORONARY ANGIOGRAPHY N/A 03/19/2022   Procedure: LEFT HEART CATH AND CORONARY ANGIOGRAPHY;  Surgeon: Swaziland, Peter M, MD;  Location: [facility] INVASIVE CV LAB;  Service: Cardiovascular;  Laterality: N/A;   LEFT HEART CATHETERIZATION WITH CORONARY ANGIOGRAM N/A 11/25/2012   demonstrated EF 30%, inferior akinesis with mild hypokinesis of all walls, patent LAD and RCA stents; ostial PDA with 80-90% obstruction with medical therapy recommended   MOHS SURGERY  2008   nose, skin graft   POLYPECTOMY  12/21/2017   Procedure: POLYPECTOMY;  Surgeon: [name], MD;  Location: [facility] ENDOSCOPY;  Service: Endoscopy;;   RETINAL DETACHMENT SURGERY Right 2013   TENOLYSIS Right 12/21/2013   Procedure: TENOLYSIS FLEXOR CARPI RADIALIS ,DEBRIDEMENT RIGHT JOINT WRIST,DEBRIDEMENT SCAPHOTRAPEZIAL TRAPEZOID, REPAIR OF EXTENSOR HOOD;  Surgeon: [name], MD;  Location: [facility];  Service: Orthopedics;  Laterality: Right;   TOE SURGERY Right 09/2019    3rd toe/hammer toe   V-TACH ABLATION  12/21/2014   VIDEO BRONCHOSCOPY Bilateral 01/09/2016   Procedure: VIDEO BRONCHOSCOPY WITHOUT FLUORO;  Surgeon: [name], MD;  Location: [facility] ENDOSCOPY;  Service: Cardiopulmonary;  Laterality: Bilateral;    Current Medications: Current Outpatient Medications on File Prior to Visit  Medication Sig   albuterol (PROVENTIL) (2.5 MG/3ML) 0.083% nebulizer solution USE 1 VIAL VIA NEBULIZER EVERY 6 HOURS AS NEEDED FOR WHEEZING OR SHORTNESS OF BREATH   amiodarone (PACERONE) 200 MG tablet TAKE ONE TABLET BY MOUTH DAILY   apixaban (ELIQUIS) 5 MG TABS tablet Take 1 tablet (5 mg total) by mouth 2 (two) times daily.   Artificial Tear Solution (SOOTHE XP OP) Place 2 drops into both eyes daily as needed (dry eyes).   ascorbic acid (VITAMIN C) 500 MG tablet Take 1 tablet by mouth daily.   aspirin EC 81 MG tablet Take 1 tablet (81 mg total) by mouth daily. Swallow whole.   busPIRone (BUSPAR) 15 MG tablet Take 15 mg by mouth 2 (two) times daily. Patient taking 10 mg by mouth twice daily   calcium citrate (CALCITRATE - DOSED IN MG ELEMENTAL CALCIUM) 950 (200 Ca) MG tablet Take 200 mg of elemental calcium by mouth daily.   carvedilol (COREG) 3.125 MG tablet Take 1 tablet (3.125 mg total) by mouth 2 (two) times daily.   cetirizine (ZYRTEC) 10 MG tablet Take 1 tablet by mouth daily.   Choline Fenofibrate 135 MG capsule Take 135 mg by mouth daily.   Cyanocobalamin (VITAMIN B-12) 5000 MCG SUBL Place under the tongue daily.   Docusate Sodium (DSS) 100 MG CAPS Take 2 capsules by mouth daily in the afternoon. 2-3 capsules daily   empagliflozin (JARDIANCE) 10 MG TABS tablet Take 1 tablet (10 mg total) by mouth daily.   escitalopram (LEXAPRO) 10 MG tablet Take 1 tablet by mouth daily.   fluticasone (FLONASE) 50 MCG/ACT nasal spray Place 2 sprays into both nostrils daily.   furosemide (LASIX) 20 MG tablet Take 1 tablet (20 mg total) by mouth daily as needed (Take any day that  your weight is over 185).   gabapentin (NEURONTIN) 300 MG capsule Take 1 capsule (300 mg total) by mouth 2 (two) times daily.   guaiFENesin (MUCINEX) 600 MG 12 hr tablet Take 1 tablet by mouth 2 (two) times daily.   HYDROcodone-acetaminophen (NORCO/VICODIN) 5-325 MG tablet Take 0.5 tablets by mouth every 6 (six) hours as needed for moderate pain.   Ipratropium-Albuterol (COMBIVENT RESPIMAT) 20-100 MCG/ACT AERS respimat Inhale 1 puff into the lungs  every 6 (six) hours. Shortness of breath or wheezing   IRON PO Take 1 tablet by mouth daily.   MAGNESIUM-OXIDE 400 (241.3 Mg) MG tablet TAKE 1 TABSULE BY MOUTH DAILY (Patient taking differently: Take 400 mg by mouth daily.)   mexiletine (MEXITIL) 200 MG capsule Take 1 capsule (200 mg total) by mouth 2 (two) times daily.   Multiple Vitamins-Minerals (CENTRUM ADULTS PO) Take by mouth daily.   nitroGLYCERIN (NITROSTAT) 0.4 MG SL tablet Place 1 tablet (0.4 mg total) under the tongue every 5 (five) minutes as needed for chest pain. DISSOLVE 1 TABLET UNDER THE TONGUE EVERY 5 MINUTES FOR 3 DOSES   omeprazole (PRILOSEC) 20 MG capsule Take 20 mg by mouth daily.    polyethylene glycol powder (GLYCOLAX/MIRALAX) 17 GM/SCOOP powder Take 1 capful in 8 ounces of fluid each morning   Respiratory Therapy Supplies (FLUTTER) DEVI Use as directed (Patient taking differently: 1 each by Other route See admin instructions. Use as directed)   rosuvastatin (CRESTOR) 40 MG tablet Take 1 tablet (40 mg total) by mouth daily. (Patient taking differently: Take 40 mg by mouth at bedtime.)   sacubitril-valsartan (ENTRESTO) 24-26 MG Take 1 tablet by mouth 2 (two) times daily.   Sennosides-Docusate Sodium (STOOL SOFTENER/LAXATIVE PO) Take 2 tablets by mouth daily.   Spacer/Aero-Holding Chambers (OPTICHAMBER DIAMOND) MISC optichamber [facility] (Patient taking differently: 1 each by Other route See admin instructions. optichamber [facility])   spironolactone (ALDACTONE) 25 MG tablet Take 1 tablet (25 mg  total) by mouth daily.   STIOLTO RESPIMAT 2.5-2.5 MCG/ACT AERS Inhale 2 puffs into the lungs daily.   tamsulosin (FLOMAX) 0.4 MG CAPS capsule TAKE ONE CAPSULE BY MOUTH DAILY AFTER SUPPER (Patient taking differently: Take 0.4 mg by mouth See admin instructions. TAKE ONE CAPSULE BY MOUTH DAILY AFTER SUPPER)   Cholecalciferol 50 MCG (2000 UT) TABS Take 1 tablet by mouth daily.   No current facility-administered medications on file prior to visit.     Allergies:   Sulfa antibiotics   Social History   Tobacco Use   Smoking status: Former    Packs/day: 1.00    Years: 55.00    Additional pack years: 0.00    Total pack years: 55.00    Types: Cigarettes   Smokeless tobacco: Never   Tobacco comments:    off/on, always ready to quit but does not work out  Building services engineer Use: Never used  Substance Use Topics   Alcohol use: Yes    Alcohol/week: 0.0 standard drinks of alcohol    Comment: occ   Drug use: No    Family History: family history includes CAD in his father and mother; Heart attack in his brother; Hypertension in his brother, father, and mother. There is no history of Stroke.  ROS:   Please see the history of present illness. (+) Shortness of breath (+) Orthopnea (+) Intermittent LE edema (+) Sputum production (+) Cough (+) Chills (+) Left ankle pain/swelling All other systems are reviewed and negative.    EKGs/Labs/Other Studies Reviewed:    The following studies were reviewed today:  ECHO 07/02/2022  1. Left ventricular ejection fraction, by estimation, is 30%. The left  ventricle has moderate to severely decreased function. The left ventricle  demonstrates regional wall motion abnormalities with basal inferoseptal  akinesis, basal to mid inferior  akinesis, inferolateral akinesis, anterolateral hypokinesis. The left  ventricular internal cavity size was mildly to moderately dilated. There  is mild concentric left ventricular hypertrophy. Left ventricular  diastolic parameters are consistent with  Grade I diastolic dysfunction (impaired relaxation).   2. Right ventricular systolic function is normal. The right ventricular  size is mildly enlarged. Tricuspid regurgitation signal is inadequate for  assessing PA pressure.   3. Left atrial size was mildly dilated.   4. Right atrial size was mildly dilated.   5. The mitral valve is normal in structure. Trivial mitral valve  regurgitation. No evidence of mitral stenosis.   6. The aortic valve is tricuspid. There is moderate calcification of the  aortic valve. Aortic valve regurgitation is trivial. No aortic stenosis is  present.   7. The inferior vena cava is normal in size with greater than 50%  respiratory variability, suggesting right atrial pressure of 3 mmHg.    Cardiac cath 03/19/2022   Mid LM to Dist LM lesion is 20% stenosed.   Ost LAD to Prox LAD lesion is 25% stenosed.   Prox LAD to Mid LAD lesion is 35% stenosed.   3rd Mrg lesion is 100% stenosed.   Prox RCA to Mid RCA lesion is 40% stenosed.   RPDA lesion is 80% stenosed.   There is mild left ventricular systolic dysfunction.   LV end diastolic pressure is mildly elevated.   The left ventricular ejection fraction is 45-50% by visual estimate.   2 vessel obstructive CAD - 100% occlusion of distal OM3 appears to be the culprit. The ostial PDA lesion is unchanged from 2014 Patent stents in the LAD and RCA Mild LV dysfunction with inferior wall motion abnormality Mildly elevated LVEDP 16 mm Hg   Plan: recommend medical therapy  EKG:  EKG is personally reviewed.   09/02/2022:  NSR, ICVD, no acute changes 07/28/2022 (Dr. Royann Shivers): normal sinus rhythm, minor nonspecific intraventricular conduction delay, prolonged QTc 528 ms. There are no acute ischemic repolarization abnormalities.   Recent Labs: 03/18/2022: B Natriuretic Peptide 90.1 04/08/2022: ALT 23 04/11/2022: Magnesium 2.1 06/04/2022: Hemoglobin 11.4; NT-Pro BNP 250;  Platelets 122; TSH 2.170 07/20/2022: BUN 5; Creatinine, Ser 0.79; Potassium 4.3; Sodium 140   Recent Lipid Panel    Component Value Date/Time   CHOL 162 03/19/2022 0205   CHOL 117 07/14/2021 1026   TRIG 75 03/19/2022 0205   HDL 44 03/19/2022 0205   HDL 47 07/14/2021 1026   CHOLHDL 3.7 03/19/2022 0205   VLDL 15 03/19/2022 0205   LDLCALC 103 (H) 03/19/2022 0205   LDLCALC 53 07/14/2021 1026   LDLCALC 67 03/01/2020 1344    Physical Exam:    VS:  BP 106/68 (BP Location: Right Arm, Patient Position: Sitting, Cuff Size: Normal)   Pulse 69   Ht 6' (1.829 m)   Wt 185 lb 14.4 oz (84.3 kg)   SpO2 95%   BMI 25.21 kg/m     Wt Readings from Last 3 Encounters:  09/02/22 185 lb 14.4 oz (84.3 kg)  07/28/22 190 lb 3.2 oz (86.3 kg)  07/23/22 188 lb 6.4 oz (85.5 kg)    GEN: Well nourished, well developed in no acute distress HEENT: Normal, moist mucous membranes NECK: No JVD CARDIAC: regular rhythm, normal S1 and S2, no rubs or gallops. No murmur. VASCULAR: Radial and DP pulses 2+ bilaterally. No carotid bruits RESPIRATORY:  Prolonged expiratory phase, rhonchi of right lung, mild wheezing.   ABDOMEN: Soft, non-tender, non-distended MUSCULOSKELETAL:  Ambulates independently SKIN: Warm and dry, mild LE edema to left ankle NEUROLOGIC:  Alert and oriented x 3. No focal neuro deficits noted. PSYCHIATRIC:  Normal affect  ASSESSMENT:    1. Shortness of breath   2. Chronic combined systolic and diastolic heart failure (HCC)   3. Coronary artery disease involving native coronary artery of native heart without angina pectoris   4. Ischemic cardiomyopathy   5. COPD mixed type (HCC)    PLAN:    Shortness of breath Chronic combined systolic and diastolic heart failure Ischemic cardiomyopathy, history of CAD COPD -he appears euvolemic on exam. Reports no weight gain (actually slight weight loss), no PND, no orthopnea. Tried lasix last week without improvement. Does have some slight  rhonchi/wheezing on the right middle/lower lobe, but no rales. Denies fevers. Has chronic cough with white sputum, no recent sputum changes. Gets relief for about an hour after using his rescue inhaler. Hasn't seen his pulmonologist at [facility] yet but plans to make appt -will check BNP. If elevated, would trial brief course of lasix to see if symptoms improve -instructed him to watch for worsening symptoms, change in cough, fevers/chills. If this happens, instructed him to see PCP or pulmonologist to determine if he needs treatment for COPD exacerbation -reviewed red flag warning signs that need immediate medical attention  Plan for follow up: With Dr. Royann Shivers or APP in 2 months.    Jodelle Red, MD, PhD, [facility]  [facility]    Medication Adjustments/Labs and Tests Ordered: Current medicines are reviewed at length with the patient today.  Concerns regarding medicines are outlined above.   Orders Placed This Encounter  Procedures   B Nat Peptide   EKG 12-Lead   No orders of the defined types were placed in this encounter.  Patient Instructions  Medication Instructions:  Your physician recommends that you continue on your current medications as directed. Please refer to the Current Medication list given to you today.   *If you need a refill on your cardiac medications before your next appointment, please call your pharmacy*  Lab Work: BNP TODAY   If you have labs (blood work) drawn today and your tests are completely normal, you will receive your results only by: MyChart Message (if you have MyChart) OR A paper copy in the mail If you have any lab test that is abnormal or we need to change your treatment, we will call you to review the results.  Testing/Procedures: NONE   Follow-Up: At [facility], you and your health needs are our priority.  As part of our continuing mission to provide you with exceptional heart care, we have created designated  Provider Care Teams.  These Care Teams include your primary Cardiologist (physician) and Advanced Practice Providers (APPs -  Physician Assistants and Nurse Practitioners) who all work together to provide you with the care you need, when you need it.  We recommend signing up for the patient portal called "MyChart".  Sign up information is provided on this After Visit Summary.  MyChart is used to connect with patients for Virtual Visits (Telemedicine).  Patients are able to view lab/test results, encounter notes, upcoming appointments, etc.  Non-urgent messages can be sent to your provider as well.   To learn more about what you can do with MyChart, go to [email or web address].    Your next appointment:   2 month(s)  Provider:   APP/NP IN 2 MONTHS AT [facility]   DR CROITORU IN OCTOBER      I,Mathew Stumpf,acting as a scribe for Jodelle Red, MD.,have documented all relevant documentation on the behalf of Jodelle Red, MD,as directed by  Jodelle Red, MD  while in the presence of Jodelle Red, MD.  I, Jodelle Red, MD, have reviewed all documentation for this visit. The documentation on 09/02/22 for the exam, diagnosis, procedures, and orders are all accurate and complete.   Signed, Jodelle Red, MD PhD 09/02/2022 9:06 AM    [facility]

## 2022-09-02 NOTE — Patient Instructions (Addendum)
Medication Instructions:  Your physician recommends that you continue on your current medications as directed. Please refer to the Current Medication list given to you today.   *If you need a refill on your cardiac medications before your next appointment, please call your pharmacy*  Lab Work: BNP TODAY   If you have labs (blood work) drawn today and your tests are completely normal, you will receive your results only by: MyChart Message (if you have MyChart) OR A paper copy in the mail If you have any lab test that is abnormal or we need to change your treatment, we will call you to review the results.  Testing/Procedures: NONE   Follow-Up: At [facility], you and your health needs are our priority.  As part of our continuing mission to provide you with exceptional heart care, we have created designated Provider Care Teams.  These Care Teams include your primary Cardiologist (physician) and Advanced Practice Providers (APPs -  Physician Assistants and Nurse Practitioners) who all work together to provide you with the care you need, when you need it.  We recommend signing up for the patient portal called "MyChart".  Sign up information is provided on this After Visit Summary.  MyChart is used to connect with patients for Virtual Visits (Telemedicine).  Patients are able to view lab/test results, encounter notes, upcoming appointments, etc.  Non-urgent messages can be sent to your provider as well.   To learn more about what you can do with MyChart, go to [email or web address].    Your next appointment:   2 month(s)  Provider:   APP/NP IN 2 MONTHS AT [facility]   DR CROITORU IN OCTOBER

## 2022-09-03 LAB — BRAIN NATRIURETIC PEPTIDE: BNP: 58.8 pg/mL (ref 0.0–100.0)

## 2022-09-17 ENCOUNTER — Other Ambulatory Visit: Payer: Self-pay | Admitting: Internal Medicine

## 2022-09-29 ENCOUNTER — Telehealth: Payer: Self-pay | Admitting: Cardiovascular Disease

## 2022-09-29 DIAGNOSIS — I5042 Chronic combined systolic (congestive) and diastolic (congestive) heart failure: Secondary | ICD-10-CM

## 2022-09-29 NOTE — Telephone Encounter (Signed)
Pt would like a callback for a Barostim procedure that he is going to have done. Please advise.

## 2022-09-29 NOTE — Telephone Encounter (Signed)
Spoke to the pt, he is considering  Barostim implant. Pt stated he will referral to vascular surgeon in the [facility] network.  Will forward to MD and nurse.

## 2022-09-30 NOTE — Telephone Encounter (Signed)
Patient is aware referral was placed

## 2022-10-28 ENCOUNTER — Other Ambulatory Visit: Payer: Self-pay | Admitting: *Deleted

## 2022-10-28 DIAGNOSIS — Z01818 Encounter for other preprocedural examination: Secondary | ICD-10-CM

## 2022-10-29 NOTE — Progress Notes (Unsigned)
Cardiology Clinic Note   Patient Name: Steven Guzman Date of Encounter: 11/02/2022  Primary Care Provider:  Laroy Apple, PA-C Primary Cardiologist:  Thurmon Fair, MD  Patient Profile    Steven Guzman 82 year old male presents the clinic today for follow-up evaluation of his atrial fibrillation, coronary artery disease , and chronic systolic CHF.  Past Medical History    Past Medical History:  Diagnosis Date   AICD (automatic cardioverter/defibrillator) present    [employer]   Aneurysm [facility])    a. Aneurysmal infrarenal aorta up to 33 mm on CT 10/2014, recommended f/u due 10/2017   Anginal pain (HCC)    Anxiety    Basal cell carcinoma of nose    S/P MOHS   Biliary acute pancreatitis    CAD (coronary artery disease)    a. s/p MI in 1994 with PCI to LAD at that time b. cath 10/2012 demonstrated EF 30%, inferior akinesis with mild hypokinesis of all walls, patent LAD and RCA stents; ostial PDA with 80-90% obstruction with medical therapy recommended    Chronic systolic CHF (congestive heart failure) (HCC)    EF 30 to 35 % as of 09/2014.    CKD (chronic kidney disease), stage III (HCC)    Complication of anesthesia 10/2014   "had to have defibrillator w/ERCP"   COPD (chronic obstructive pulmonary disease) (HCC)    a. followed by pulmonary, COPD GOLD stage II   Depression    Diverticulosis of colon 07/2014   noted on CT   GERD (gastroesophageal reflux disease)    Hiatal hernia    Hyperglycemia 10/2012   Hyperlipidemia    Hypertension    Myocardial infarction [facility]) 1994; 2011   Pneumonia 1946; 2015   Prostate enlargement 07/2014   observed on CT   Tobacco abuse    Ventricular tachycardia (HCC)    a. 08/2009 s/p BSX E110 Teligen 100 AICD, ser#: [id number];  b. 08/2008 VT req ATP - detection reprogrammed from 160 to 150. c. EPS and VT ablation by Dr. Ladona Ridgel 12/21/2014   Past Surgical History:  Procedure Laterality Date   BIOPSY  12/21/2017   Procedure: BIOPSY;   Surgeon: [name], MD;  Location: [facility] ENDOSCOPY;  Service: Endoscopy;;   CATARACT EXTRACTION W/ INTRAOCULAR LENS  IMPLANT, BILATERAL Bilateral ~ 2011   COLONOSCOPY     COLONOSCOPY WITH PROPOFOL N/A 12/21/2017   Procedure: COLONOSCOPY WITH PROPOFOL;  Surgeon: [name], MD;  Location: [facility] ENDOSCOPY;  Service: Endoscopy;  Laterality: N/A;   ELECTROPHYSIOLOGIC STUDY N/A 12/21/2014   Procedure: V Tach Ablation;  Surgeon: Marinus Maw, MD;  Location: [facility] INVASIVE CV LAB;  Service: Cardiovascular;  Laterality: N/A;   ERCP N/A 11/16/2014   Procedure: ENDOSCOPIC RETROGRADE CHOLANGIOPANCREATOGRAPHY (ERCP);  Surgeon: Louis Meckel, MD;  Location: [facility] ENDOSCOPY;  Service: Endoscopy;  Laterality: N/A;   ESOPHAGOGASTRODUODENOSCOPY (EGD) WITH PROPOFOL N/A 12/21/2017   Procedure: ESOPHAGOGASTRODUODENOSCOPY (EGD) WITH PROPOFOL;  Surgeon: [name], MD;  Location: [facility] ENDOS[facility];  Service: Endoscopy;  Laterality: N/A;   EYE SURGERY     FOOT SURGERY Left 2005   "fixed bone that stuck out in my ankle area"   HEMORRHOID BANDING     ICD GENERATOR CHANGEOUT N/A 04/20/2022   Procedure: ICD GENERATOR CHANGEOUT;  Surgeon: Marinus Maw, MD;  Location: [facility] INVASIVE CV LAB;  Service: Cardiovascular;  Laterality: N/A;   IMPLANTABLE CARDIOVERTER DEFIBRILLATOR IMPLANT  09/06/09   BSX dual chamber ICD implanted in [place] for  cardiac arrest 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   INGUINAL HERNIA REPAIR Right ~ 1995   INGUINAL HERNIA REPAIR Left 04/10/2022   Procedure: HERNIA REPAIR INGUINAL ADULT;  Surgeon: Emelia Loron, MD;  Location: [facility] OR;  Service: General;  Laterality: Left;   LEFT HEART CATH AND CORONARY ANGIOGRAPHY N/A 03/19/2022   Procedure: LEFT HEART CATH AND CORONARY ANGIOGRAPHY;  Surgeon: Swaziland, Peter M, MD;  Location: [facility] INVASIVE CV LAB;  Service: Cardiovascular;  Laterality: N/A;   LEFT HEART CATHETERIZATION WITH CORONARY ANGIOGRAM N/A 11/25/2012   demonstrated EF 30%, inferior akinesis with mild hypokinesis  of all walls, patent LAD and RCA stents; ostial PDA with 80-90% obstruction with medical therapy recommended   MOHS SURGERY  2008   nose, skin graft   POLYPECTOMY  12/21/2017   Procedure: POLYPECTOMY;  Surgeon: [name], MD;  Location: [facility];  Service: Endoscopy;;   RETINAL DETACHMENT SURGERY Right 2013   TENOLYSIS Right 12/21/2013   Procedure: TENOLYSIS FLEXOR CARPI RADIALIS ,DEBRIDEMENT RIGHT JOINT WRIST,DEBRIDEMENT SCAPHOTRAPEZIAL TRAPEZOID, REPAIR OF EXTENSOR HOOD;  Surgeon: [name], MD;  Location: [facility];  Service: Orthopedics;  Laterality: Right;   TOE SURGERY Right 09/2019   3rd toe/hammer toe   V-TACH ABLATION  12/21/2014   VIDEO BRONCHOSCOPY Bilateral 01/09/2016   Procedure: VIDEO BRONCHOSCOPY WITHOUT FLUORO;  Surgeon: [name], MD;  Location: [facility];  Service: Cardiopulmonary;  Laterality: Bilateral;    Allergies  Allergies  Allergen Reactions   Sulfa Antibiotics Hives    History of Present Illness    Steven Guzman has a PMH of coronary artery disease (inferior MI complicated by cardiac arrest in 1995, PCI with stent to his LAD in 2011, NSTEMI 2013 due to occlusion of his OM 3, medical management recommended; cardiac catheterization 12/23 showed patent stents in his LAD and RCA with complete occlusion of his OM 3 and stable chronic stenosis 80% of the PDA), ischemic cardiomyopathy echocardiogram 12/23 showed an LVEF of 25-30% with inferior and inferior lateral akinesis, VT requiring ICD implantation [employer] implanted 2010 and misery with generator change out 04/20/2022, VT ablation 2016 (on amiodarone), paroxysmal atrial fibrillation on chronic anticoagulation, AAA, CKD stage III, COPD, HTN, HLD, hiatal hernia/GERD, depression, and shortness of breath.  He follows with Dr. Royann Shivers and Dr. Ladona Ridgel.  He also follows with a specialist in [facility] for COPD.  He was seen by Dr. Royann Shivers 4/24.  During that time he noted  dyspnea on exertion and noted increased work of breathing with bending over.  He reported dizziness with standing up quickly and blood pressures frequently in the 90s systolic.  He was not felt to need increased diuresis at that time.  It was not felt that his shortness of breath was entirely related to cardiac etiology.  It was felt that if he was not found to have ENT or pulmonary abnormalities to may be a good candidate for Barostim implant with electrophysiology.  He was seen in follow-up by Dr. Cristal Deer 09/02/2022.  During that time he continued to have issues with DOE.  He described increased work of breathing with moving/lifting objects.  Over the previous week he did not note much increased shortness of breath.  He was using his nebulizer as needed.  He noted improvement in symptoms for 1-2 hours after use.  He was considering Barostim implant.  He reported shortness of breath with first laying down at night.  He denied PND.  His wife did report some apneic type breathing  at night.  He reported that he did not feel his stop breathing at night.  He has had several sleep studies in the past.  He reported intermittent weights at home.  He was taking furosemide when his weight would go over 185 pounds.  His weight during visit was 182 pounds.  He reported wearing lower extremity compression stockings regularly.  He was previously diagnosed with COPD and endorsed clear/whitish sputum production.  He did note at times he would cough severely enough that he was unable to breathe.  He did note some chills over the prior few days but did not feel ill at the time of the visit.  He denied palpitations, chest pain, lightheadedness, headaches, and syncopal events.  He had follow-up planned with pulmonologist at [facility].  Follow-up was planned for 2 months.  He presents to the clinic today for follow-up evaluation and states his weight continue to decrease.  He weighs 177.2 pounds today.  His blood pressure is 88/58.   He reports somewhat labile blood pressures at home.  His blood pressure last night was 125/75.  He double check this.  We reviewed his medications.  He expressed understanding.  He has an appointment with vascular surgery for Barostimon the 26th.  He reports that he has started smoking.  I strongly encourage cessation.  I will decrease his spironolactone to 12.5 mg daily and plan follow-up in 3 to 4 months.  Today he denies chest pain, increased shortness of breath, lower extremity edema, increased fatigue, palpitations, melena, hematuria, hemoptysis, diaphoresis, weakness, presyncope, syncope, orthopnea, and PND.    Home Medications    Prior to Admission medications   Medication Sig Start Date End Date Taking? Authorizing Provider  albuterol (PROVENTIL) (2.5 MG/3ML) 0.083% nebulizer solution USE 1 VIAL VIA NEBULIZER EVERY 6 HOURS AS NEEDED FOR WHEEZING OR SHORTNESS OF BREATH 07/18/19   Olalere, Adewale A, MD  amiodarone (PACERONE) 200 MG tablet TAKE ONE TABLET BY MOUTH DAILY 04/08/22   Croitoru, Rachelle Hora, MD  apixaban (ELIQUIS) 5 MG TABS tablet Take 1 tablet (5 mg total) by mouth 2 (two) times daily. 04/28/22   Croitoru, Mihai, MD  Artificial Tear Solution (SOOTHE XP OP) Place 2 drops into both eyes daily as needed (dry eyes).    [provider]  ascorbic acid (VITAMIN C) 500 MG tablet Take 1 tablet by mouth daily. 02/10/22   [provider]  aspirin EC 81 MG tablet Take 1 tablet (81 mg total) by mouth daily. Swallow whole. 03/21/22   Azalee Course, PA  busPIRone (BUSPAR) 15 MG tablet Take 15 mg by mouth 2 (two) times daily. Patient taking 10 mg by mouth twice daily    [provider]  calcium citrate (CALCITRATE - DOSED IN MG ELEMENTAL CALCIUM) 950 (200 Ca) MG tablet Take 200 mg of elemental calcium by mouth daily. 02/10/22   [provider]  carvedilol (COREG) 3.125 MG tablet Take 1 tablet (3.125 mg total) by mouth 2 (two) times daily. 03/11/22   Lyn Records, MD   cetirizine (ZYRTEC) 10 MG tablet Take 1 tablet by mouth daily. 12/16/17   [provider]  Cholecalciferol 50 MCG (2000 UT) TABS Take 1 tablet by mouth daily. 09/12/18   [provider]  Choline Fenofibrate 135 MG capsule Take 135 mg by mouth daily.    [provider]  Cyanocobalamin (VITAMIN B-12) 5000 MCG SUBL Place under the tongue daily.    [provider]  Docusate Sodium (DSS) 100 MG CAPS  Take 2 capsules by mouth daily in the afternoon. 2-3 capsules daily 02/10/22   [provider]  empagliflozin (JARDIANCE) 10 MG TABS tablet Take 1 tablet (10 mg total) by mouth daily. 03/21/22   Azalee Course, PA  escitalopram (LEXAPRO) 10 MG tablet Take 1 tablet by mouth daily. 07/24/22 07/24/23  [provider]  fluticasone (FLONASE) 50 MCG/ACT nasal spray Place 2 sprays into both nostrils daily. 02/13/21   Fargo, Amy E, NP  furosemide (LASIX) 20 MG tablet Take 1 tablet (20 mg total) by mouth daily as needed (Take any day that your weight is over 185). 06/04/22   Croitoru, Mihai, MD  gabapentin (NEURONTIN) 300 MG capsule Take 1 capsule (300 mg total) by mouth 2 (two) times daily. 03/18/22   Fargo, Amy E, NP  guaiFENesin (MUCINEX) 600 MG 12 hr tablet Take 1 tablet by mouth 2 (two) times daily.    [provider]  HYDROcodone-acetaminophen (NORCO/VICODIN) 5-325 MG tablet Take 0.5 tablets by mouth every 6 (six) hours as needed for moderate pain. 04/12/22   Adam Phenix, PA-C  Ipratropium-Albuterol (COMBIVENT RESPIMAT) 20-100 MCG/ACT AERS respimat Inhale 1 puff into the lungs every 6 (six) hours. Shortness of breath or wheezing 09/18/21   Fargo, Amy E, NP  IRON PO Take 1 tablet by mouth daily.    [provider]  MAGNESIUM-OXIDE 400 (241.3 Mg) MG tablet TAKE 1 TABSULE BY MOUTH DAILY Patient taking differently: Take 400 mg by mouth daily. 11/24/18   Myrlene Broker, MD  mexiletine (MEXITIL) 200 MG capsule Take 1 capsule (200 mg total) by  mouth 2 (two) times daily. 10/10/21   Lyn Records, MD  Multiple Vitamins-Minerals (CENTRUM ADULTS PO) Take by mouth daily.    [provider]  nitroGLYCERIN (NITROSTAT) 0.4 MG SL tablet Place 1 tablet (0.4 mg total) under the tongue every 5 (five) minutes as needed for chest pain. DISSOLVE 1 TABLET UNDER THE TONGUE EVERY 5 MINUTES FOR 3 DOSES 06/19/22   Croitoru, Mihai, MD  omeprazole (PRILOSEC) 20 MG capsule Take 20 mg by mouth daily.     [provider]  polyethylene glycol powder (GLYCOLAX/MIRALAX) 17 GM/SCOOP powder Take 1 capful in 8 ounces of fluid each morning 03/08/22   Linward Foster, MD  Respiratory Therapy Supplies (FLUTTER) DEVI Use as directed Patient taking differently: 1 each by Other route See admin instructions. Use as directed 03/16/17   [name], MD  rosuvastatin (CRESTOR) 40 MG tablet TAKE ONE TABLET BY MOUTH DAILY 09/17/22   Marinus Maw, MD  sacubitril-valsartan (ENTRESTO) 24-26 MG Take 1 tablet by mouth 2 (two) times daily. 03/20/22   Azalee Course, PA  Sennosides-Docusate Sodium (STOOL SOFTENER/LAXATIVE PO) Take 2 tablets by mouth daily.    [provider]  Spacer/Aero-Holding Chambers (OPTICHAMBER DIAMOND) MISC optichamber [facility] Patient taking differently: 1 each by Other route See admin instructions. optichamber [facility] 09/12/19   Olalere, Adewale A, MD  spironolactone (ALDACTONE) 25 MG tablet Take 1 tablet (25 mg total) by mouth daily. 06/04/22   Croitoru, Mihai, MD  STIOLTO RESPIMAT 2.5-2.5 MCG/ACT AERS Inhale 2 puffs into the lungs daily. 09/22/21   [provider]  tamsulosin (FLOMAX) 0.4 MG CAPS capsule TAKE ONE CAPSULE BY MOUTH DAILY AFTER SUPPER Patient taking differently: Take 0.4 mg by mouth See admin instructions. TAKE ONE CAPSULE BY MOUTH DAILY AFTER SUPPER 06/30/21   Octavia Heir, NP    Family History    Family History  Problem Relation Age of Onset  Heart attack Brother    CAD Father    Hypertension Father    CAD Mother     Hypertension Mother    Hypertension Brother    Stroke Neg Hx    He indicated that his mother is deceased. He indicated that his father is deceased. He indicated that one of his two brothers is deceased. He indicated that his maternal grandmother is deceased. He indicated that his maternal grandfather is deceased. He indicated that his paternal grandmother is deceased. He indicated that his paternal grandfather is deceased. He indicated that the status of his neg hx is unknown.  Social History    Social History   Socioeconomic History   Marital status: Married    Spouse name: Not on file   Number of children: Not on file   Years of education: Not on file   Highest education level: Not on file  Occupational History   Occupation: Retired  Tobacco Use   Smoking status: Former    Current packs/day: 1.00    Average packs/day: 1 pack/day for 55.0 years (55.0 ttl pk-yrs)    Types: Cigarettes   Smokeless tobacco: Never   Tobacco comments:    off/on, always ready to quit but does not work out  Building services engineer status: Never Used  Substance and Sexual Activity   Alcohol use: Yes    Alcohol/week: 0.0 standard drinks of alcohol    Comment: occ   Drug use: No   Sexual activity: Not Currently  Other Topics Concern   Not on file  Social History Narrative   Not on file   Social Determinants of Health   Financial Resource Strain: Medium Risk (03/06/2021)   Overall Financial Resource Strain (CARDIA)    Difficulty of Paying Living Expenses: Somewhat hard  Food Insecurity: No Food Insecurity (04/13/2022)   Hunger Vital Sign    Worried About Running Out of Food in the Last Year: Never true    Ran Out of Food in the Last Year: Never true  Transportation Needs: No Transportation Needs (04/13/2022)   PRAPARE - Administrator, Civil Service (Medical): No    Lack of Transportation (Non-Medical): No  Physical Activity: Insufficiently Active (03/06/2021)   Exercise Vital Sign     Days of Exercise per Week: 4 days    Minutes of Exercise per Session: 20 min  Stress: No Stress Concern Present (12/27/2021)   [facility] from [facility] visits prior to 05/30/2022., [facility] visits prior to 05/30/2022., [facility]   [employer] of Occupational Health - Occupational Stress Questionnaire    Feeling of Stress : Not at all  Social Connections: Unknown (11/08/2021)   Received from [facility]   Social Network    Social Network: Not on file  Intimate Partner Violence: Not At Risk (09/12/2022)   Received from [facility]   HITS    Over the last 12 months how often did your partner physically hurt you?: 1    Over the last 12 months how often did your partner insult you or talk down to you?: 1    Over the last 12 months how often did your partner threaten you with physical harm?: 1    Over the last 12 months how often did your partner scream or curse at you?: 1     Review of Systems    General:  No chills, fever, night sweats or weight changes.  Cardiovascular:  No chest pain, dyspnea on exertion, edema, orthopnea, palpitations, paroxysmal nocturnal dyspnea. Dermatological: No rash, lesions/masses Respiratory: No cough, dyspnea Urologic: No hematuria, dysuria Abdominal:   No nausea, vomiting, diarrhea, bright red blood per rectum, melena, or hematemesis Neurologic:  No visual changes, wkns, changes in mental status. All other systems reviewed and are otherwise negative except as noted above.  Physical Exam    VS:  BP (!) 88/58 (BP Location: Left Arm, Patient Position: Sitting, Cuff Size: Normal)   Pulse 63   Ht 6' (1.829 m)   Wt 177 lb 3.2 oz (80.4 kg)   SpO2 95%   BMI 24.03 kg/m  , BMI Body mass index is 24.03 kg/m. GEN: Well nourished, well developed, in no acute distress. HEENT: normal. Neck: Supple, no JVD, carotid bruits, or masses. Cardiac: RRR, no murmurs, rubs, or gallops. No clubbing,  cyanosis, edema.  Radials/DP/PT 2+ and equal bilaterally.  Respiratory:  Respirations regular and unlabored, clear to auscultation bilaterally. GI: Soft, nontender, nondistended, BS + x 4. MS: no deformity or atrophy. Skin: warm and dry, no rash. Neuro:  Strength and sensation are intact. Psych: Normal affect.  Accessory Clinical Findings    Recent Labs: 04/08/2022: ALT 23 04/11/2022: Magnesium 2.1 06/04/2022: Hemoglobin 11.4; NT-Pro BNP 250; Platelets 122; TSH 2.170 07/20/2022: BUN 5; Creatinine, Ser 0.79; Potassium 4.3; Sodium 140 09/02/2022: BNP 58.8   Recent Lipid Panel    Component Value Date/Time   CHOL 162 03/19/2022 0205   CHOL 117 07/14/2021 1026   TRIG 75 03/19/2022 0205   HDL 44 03/19/2022 0205   HDL 47 07/14/2021 1026   CHOLHDL 3.7 03/19/2022 0205   VLDL 15 03/19/2022 0205   LDLCALC 103 (H) 03/19/2022 0205   LDLCALC 53 07/14/2021 1026   LDLCALC 67 03/01/2020 1344         ECG personally reviewed by me today-none today.  Echocardiogram 07/02/2022 IMPRESSIONS     1. Left ventricular ejection fraction, by estimation, is 30%. The left  ventricle has moderate to severely decreased function. The left ventricle  demonstrates regional wall motion abnormalities with basal inferoseptal  akinesis, basal to mid inferior  akinesis, inferolateral akinesis, anterolateral hypokinesis. The left  ventricular internal cavity size was mildly to moderately dilated. There  is mild concentric left ventricular hypertrophy. Left ventricular  diastolic parameters are consistent with  Grade I diastolic dysfunction (impaired relaxation).   2. Right ventricular systolic function is normal. The right ventricular  size is mildly enlarged. Tricuspid regurgitation signal is inadequate for  assessing PA pressure.   3. Left atrial size was mildly dilated.   4. Right atrial size was mildly dilated.   5. The mitral valve is normal in structure. Trivial mitral valve  regurgitation. No evidence of  mitral stenosis.   6. The aortic valve is tricuspid. There is moderate calcification of the  aortic valve. Aortic valve regurgitation is trivial. No aortic stenosis is  present.   7. The inferior vena cava is normal in size with greater than 50%  respiratory variability, suggesting right atrial pressure of 3 mmHg.   FINDINGS   Left Ventricle: Left ventricular ejection fraction, by estimation, is  30%. The left ventricle has moderate to severely decreased function. The  left ventricle demonstrates regional wall motion abnormalities. The left  ventricular internal cavity size was  mildly to moderately dilated. There is mild concentric left ventricular  hypertrophy. Left ventricular diastolic parameters are consistent with  Grade I diastolic dysfunction (impaired relaxation).   Right Ventricle: The  right ventricular size is mildly enlarged. No  increase in right ventricular wall thickness. Right ventricular systolic  function is normal. Tricuspid regurgitation signal is inadequate for  assessing PA pressure.   Left Atrium: Left atrial size was mildly dilated.   Right Atrium: Right atrial size was mildly dilated.   Pericardium: There is no evidence of pericardial effusion.   Mitral Valve: The mitral valve is normal in structure. Trivial mitral  valve regurgitation. No evidence of mitral valve stenosis.   Tricuspid Valve: The tricuspid valve is normal in structure. Tricuspid  valve regurgitation is not demonstrated.   Aortic Valve: The aortic valve is tricuspid. There is moderate  calcification of the aortic valve. Aortic valve regurgitation is trivial.  Aortic regurgitation PHT measures 620 msec. No aortic stenosis is present.  Aortic valve mean gradient measures 5.0  mmHg. Aortic valve peak gradient measures 9.5 mmHg. Aortic valve area, by  VTI measures 2.22 cm.   Pulmonic Valve: The pulmonic valve was normal in structure. Pulmonic valve  regurgitation is trivial.   Aorta:  The aortic root is normal in size and structure.   Venous: The inferior vena cava is normal in size with greater than 50%  respiratory variability, suggesting right atrial pressure of 3 mmHg.   IAS/Shunts: No atrial level shunt detected by color flow Doppler.   Additional Comments: A device lead is visualized in the right ventricle.    Cardiac catheterization 03/19/2022    Mid LM to Dist LM lesion is 20% stenosed.   Ost LAD to Prox LAD lesion is 25% stenosed.   Prox LAD to Mid LAD lesion is 35% stenosed.   3rd Mrg lesion is 100% stenosed.   Prox RCA to Mid RCA lesion is 40% stenosed.   RPDA lesion is 80% stenosed.   There is mild left ventricular systolic dysfunction.   LV end diastolic pressure is mildly elevated.   The left ventricular ejection fraction is 45-50% by visual estimate.   2 vessel obstructive CAD - 100% occlusion of distal OM3 appears to be the culprit. The ostial PDA lesion is unchanged from 2014 Patent stents in the LAD and RCA Mild LV dysfunction with inferior wall motion abnormality Mildly elevated LVEDP 16 mm Hg   Plan: recommend medical therapy       Assessment & Plan   1.  Combined systolic and diastolic CHF-NYHA class II.  Weight stable.  Euvolemic.  Previously felt that his symptoms were possibly related to lung issues.  Pursuing Berastim implant with E, VVs. Continue current medical therapy Reduce spironolactone to 12.5 mg daily Heart healthy low-sodium diet Continue daily weights Continue lower extremity support stockings  Ischemic cardiomyopathy-status post ICD.  Echocardiogram 4/24 showed EF of 30% and G1 DD. Follows with EP  Atrial fibrillation-heart rate today 63.  Reports compliance with apixaban.  Denies bleeding issues. Continue apixaban, mexiletine, carvedilol Avoid triggers caffeine, chocolate, EtOH, dehydration etc.  COPD-stable.  Continues to have chronic stable white/clear sputum production.  Does note some improvement with  nebulizer therapy. Follows with [facility] pulmonologist  Coronary artery disease-no chest pain today.  Status post multiple PCI with last cardiac catheterization 03/19/2022 which showed two-vessel obstructive CAD with 100% occlusion of distal OM 3, unchanged from 2014 catheterization.  Patent stents LAD and RCA. Continue medical therapy Heart healthy low-sodium diet  Hyperlipidemia-LDL 103 on 03/21/22. High-fiber diet Continue rosuvastatin, aspirin Maintain physical activity as tolerated  Tobacco use-smoking cessation strongly encouraged  Disposition: Follow-up with Dr. Royann Shivers in 3-4 months.  Thomasene Ripple.  NP-C     11/02/2022, 10:07 AM [facility] [address] Office [phone] Fax [phone]    I spent 14 minutes examining this patient, reviewing medications, and using patient centered shared decision making involving her cardiac care.  Prior to her visit I spent greater than 20 minutes reviewing her past medical history,  medications, and prior cardiac tests.

## 2022-11-02 ENCOUNTER — Ambulatory Visit: Payer: Medicare Other | Attending: General Practice | Admitting: General Practice

## 2022-11-02 ENCOUNTER — Encounter: Payer: Self-pay | Admitting: General Practice

## 2022-11-02 VITALS — BP 88/58 | HR 63 | Ht 72.0 in | Wt 177.2 lb

## 2022-11-02 DIAGNOSIS — J449 Chronic obstructive pulmonary disease, unspecified: Secondary | ICD-10-CM | POA: Diagnosis not present

## 2022-11-02 DIAGNOSIS — I255 Ischemic cardiomyopathy: Secondary | ICD-10-CM | POA: Diagnosis not present

## 2022-11-02 DIAGNOSIS — Z720 Tobacco use: Secondary | ICD-10-CM

## 2022-11-02 DIAGNOSIS — E785 Hyperlipidemia, unspecified: Secondary | ICD-10-CM

## 2022-11-02 DIAGNOSIS — I2510 Atherosclerotic heart disease of native coronary artery without angina pectoris: Secondary | ICD-10-CM

## 2022-11-02 DIAGNOSIS — I5042 Chronic combined systolic (congestive) and diastolic (congestive) heart failure: Secondary | ICD-10-CM | POA: Diagnosis not present

## 2022-11-02 DIAGNOSIS — I480 Paroxysmal atrial fibrillation: Secondary | ICD-10-CM | POA: Diagnosis not present

## 2022-11-02 MED ORDER — SPIRONOLACTONE 25 MG PO TABS: 12.5000 mg | ORAL_TABLET | Freq: Every day | ORAL | 3 refills | Status: DC

## 2022-11-02 MED ORDER — NITROGLYCERIN 0.4 MG SL SUBL: 0.4000 mg | SUBLINGUAL_TABLET | SUBLINGUAL | 3 refills | Status: DC | PRN

## 2022-11-02 NOTE — Patient Instructions (Signed)
Medication Instructions:  DECREASE SPIRONOLACTONE 12.5MG  DAILY *If you need a refill on your cardiac medications before your next appointment, please call your pharmacy*  Lab Work: NONE If you have labs (blood work) drawn today and your tests are completely normal, you will receive your results only by:  MyChart Message (if you have MyChart) OR  A paper copy in the mail If you have any lab test that is abnormal or we need to change your treatment, we will call you to review the results.  Testing/Procedures: NONE  Follow-Up: At [facility], you and your health needs are our priority.  As part of our continuing mission to provide you with exceptional heart care, we have created designated Provider Care Teams.  These Care Teams include your primary Cardiologist (physician) and Advanced Practice Providers (APPs -  Physician Assistants and Nurse Practitioners) who all work together to provide you with the care you need, when you need it.  Your next appointment:   3-4 month(s)  Provider:   Thurmon Fair, MD  or Edd Fabian, FNP       Other Instructions STOP SMOKING! INCREASE PHYSICAL ACTIVITY-AS TOLERATED PLEASE READ AND FOLLOW ATTACHED  SALTY 6

## 2022-11-06 ENCOUNTER — Ambulatory Visit (INDEPENDENT_AMBULATORY_CARE_PROVIDER_SITE_OTHER): Payer: Medicare Other

## 2022-11-06 DIAGNOSIS — I255 Ischemic cardiomyopathy: Secondary | ICD-10-CM

## 2022-11-06 LAB — CUP PACEART REMOTE DEVICE CHECK
Battery Remaining Longevity: 156 mo
Battery Remaining Percentage: 100 %
Brady Statistic RA Percent Paced: 2 %
Brady Statistic RV Percent Paced: 2 %
Date Time Interrogation Session: 20240809012800
HighPow Impedance: 52 Ohm
Implantable Lead Connection Status: 753985
Implantable Lead Connection Status: 753985
Implantable Lead Implant Date: 20110610
Implantable Lead Implant Date: 20110610
Implantable Lead Location: 753859
Implantable Lead Location: 753860
Implantable Lead Model: 185
Implantable Lead Model: 4135
Implantable Lead Serial Number: 28681386
Implantable Lead Serial Number: 339643
Implantable Pulse Generator Implant Date: 20240122
Lead Channel Impedance Value: 417 Ohm
Lead Channel Impedance Value: 677 Ohm
Lead Channel Pacing Threshold Amplitude: 0.7 V
Lead Channel Pacing Threshold Amplitude: 3.3 V
Lead Channel Pacing Threshold Pulse Width: 0.4 ms
Lead Channel Pacing Threshold Pulse Width: 0.4 ms
Lead Channel Setting Pacing Amplitude: 2 V
Lead Channel Setting Pacing Amplitude: 2.4 V
Lead Channel Setting Pacing Pulse Width: 0.4 ms
Lead Channel Setting Sensing Sensitivity: 0.5 mV
Pulse Gen Serial Number: 238282

## 2022-11-08 ENCOUNTER — Other Ambulatory Visit: Payer: Self-pay | Admitting: Cardiovascular Disease

## 2022-11-08 ENCOUNTER — Other Ambulatory Visit: Payer: Self-pay | Admitting: Orthopedic Surgery

## 2022-11-08 DIAGNOSIS — M545 Low back pain, unspecified: Secondary | ICD-10-CM

## 2022-11-08 DIAGNOSIS — I480 Paroxysmal atrial fibrillation: Secondary | ICD-10-CM

## 2022-11-09 NOTE — Telephone Encounter (Signed)
Prescription refill request for Eliquis received. Indication:afib Last office visit:8/24 Scr:0.79  4/24 Age: 82 Weight:80.4  kg  Prescription refilled

## 2022-11-10 ENCOUNTER — Other Ambulatory Visit: Payer: Self-pay | Admitting: Orthopedic Surgery

## 2022-11-10 DIAGNOSIS — M545 Low back pain, unspecified: Secondary | ICD-10-CM

## 2022-11-20 NOTE — Progress Notes (Signed)
Remote ICD transmission.   

## 2022-11-23 ENCOUNTER — Ambulatory Visit (HOSPITAL_COMMUNITY)
Admission: RE | Admit: 2022-11-23 | Discharge: 2022-11-23 | Disposition: A | Discharge status: Home / Self Care | Payer: Medicare Other | Source: Ambulatory Visit | Attending: Surgery | Admitting: Surgery

## 2022-11-23 ENCOUNTER — Encounter: Payer: Self-pay | Admitting: Surgery

## 2022-11-23 ENCOUNTER — Ambulatory Visit: Payer: Medicare Other | Admitting: Surgery

## 2022-11-23 VITALS — BP 110/75 | HR 77 | Temp 98.0°F | Resp 20 | Ht 72.0 in | Wt 172.0 lb

## 2022-11-23 DIAGNOSIS — I5042 Chronic combined systolic (congestive) and diastolic (congestive) heart failure: Secondary | ICD-10-CM

## 2022-11-23 DIAGNOSIS — Z01818 Encounter for other preprocedural examination: Secondary | ICD-10-CM | POA: Insufficient documentation

## 2022-11-23 NOTE — Progress Notes (Signed)
[facility] [place]  Patient name: Steven Guzman MRN: [id number] DOB: May 11, 1940 Sex: male   REQUESTING PROVIDER:    Dr. Royann Shivers   REASON FOR CONSULT:    Barostim evaluation  HISTORY OF PRESENT ILLNESS:   Steven Guzman is a 82 y.o. male, with NYHA class II- IIIa combined systolic and diastolic heart failure who is referred for Barostim evaluation.  The patient's most recent ejection fraction was 30%.  He has a history of coronary artery disease, status post CABG.  He has a ICD in place.  He takes Eliquis for atrial fibrillation.  He is on a statin for hypercholesterolemia.  He is medically managed for hypertension.  He is euvolemic.  His biggest complaints are fatigue, lack of energy, and shortness of breath.  He does have COPD.  PAST MEDICAL HISTORY    Past Medical History:  Diagnosis Date   AICD (automatic cardioverter/defibrillator) present    [employer]   Aneurysm [facility])    a. Aneurysmal infrarenal aorta up to 33 mm on CT 10/2014, recommended f/u due 10/2017   Anginal pain (HCC)    Anxiety    Basal cell carcinoma of nose    S/P MOHS   Biliary acute pancreatitis    CAD (coronary artery disease)    a. s/p MI in 1994 with PCI to LAD at that time b. cath 10/2012 demonstrated EF 30%, inferior akinesis with mild hypokinesis of all walls, patent LAD and RCA stents; ostial PDA with 80-90% obstruction with medical therapy recommended    Chronic systolic CHF (congestive heart failure) (HCC)    EF 30 to 35 % as of 09/2014.    CKD (chronic kidney disease), stage III (HCC)    Complication of anesthesia 10/2014   "had to have defibrillator w/ERCP"   COPD (chronic obstructive pulmonary disease) (HCC)    a. followed by pulmonary, COPD GOLD stage II   Depression    Diverticulosis of colon 07/2014   noted on CT   GERD (gastroesophageal reflux disease)    Hiatal hernia    Hyperglycemia 10/2012   Hyperlipidemia     Hypertension    Myocardial infarction [facility]) 1994; 2011   Pneumonia 1946; 2015   Prostate enlargement 07/2014   observed on CT   Tobacco abuse    Ventricular tachycardia (HCC)    a. 08/2009 s/p BSX E110 Teligen 100 AICD, ser#: [id number];  b. 08/2008 VT req ATP - detection reprogrammed from 160 to 150. c. EPS and VT ablation by Dr. Ladona Ridgel 12/21/2014     FAMILY HISTORY   Family History  Problem Relation Age of Onset   Heart attack Brother    CAD Father    Hypertension Father    CAD Mother    Hypertension Mother    Hypertension Brother    Stroke Neg Hx     SOCIAL HISTORY:   Social History   Socioeconomic History   Marital status: Married    Spouse name: Not on file   Number of children: Not on file   Years of education: Not on file   Highest education level: Not on file  Occupational History   Occupation: Retired  Tobacco Use   Smoking status: Former    Current packs/day: 1.00    Average packs/day: 1 pack/day for 55.0 years (55.0 ttl pk-yrs)    Types: Cigarettes   Smokeless tobacco: Never   Tobacco comments:    off/on, always ready to quit but does not work out  Vaping Use   Vaping status: Never Used  Substance and Sexual Activity   Alcohol use: Yes    Alcohol/week: 0.0 standard drinks of alcohol    Comment: occ   Drug use: No   Sexual activity: Not Currently  Other Topics Concern   Not on file  Social History Narrative   Not on file   Social Determinants of Health   Financial Resource Strain: Medium Risk (03/06/2021)   Overall Financial Resource Strain (CARDIA)    Difficulty of Paying Living Expenses: Somewhat hard  Food Insecurity: No Food Insecurity (04/13/2022)   Hunger Vital Sign    Worried About Running Out of Food in the Last Year: Never true    Ran Out of Food in the Last Year: Never true  Transportation Needs: No Transportation Needs (04/13/2022)   PRAPARE - Administrator, Civil Service (Medical): No    Lack of Transportation (Non-Medical):  No  Physical Activity: Insufficiently Active (03/06/2021)   Exercise Vital Sign    Days of Exercise per Week: 4 days    Minutes of Exercise per Session: 20 min  Stress: No Stress Concern Present (12/27/2021)   [facility] to [facility]30[facility] visits prior to 05/30/2022., [facility]   [employer] of Occupational Health - Occupational Stress Questionnaire    Feeling of Stress : Not at all  Social Connections: Unknown (11/08/2021)   Received from [facility]   Social Network    Social Network: Not on file  Intimate Partner Violence: Not At Risk (09/12/2022)   Received from [facility]   HITS    Over the last 12 months how often did your partner physically hurt you?: 1    Over the last 12 months how often did your partner insult you or talk down to you?: 1    Over the last 12 months how often did your partner threaten you with physical harm?: 1    Over the last 12 months how often did your partner scream or curse at you?: 1    ALLERGIES:    Allergies  Allergen Reactions   Sulfa Antibiotics Hives    CURRENT MEDICATIONS:    Current Outpatient Medications  Medication Sig Dispense Refill   albuterol (PROVENTIL) (2.5 MG/3ML) 0.083% nebulizer solution USE 1 VIAL VIA NEBULIZER EVERY 6 HOURS AS NEEDED FOR WHEEZING OR SHORTNESS OF BREATH 360 mL 0   amiodarone (PACERONE) 200 MG tablet TAKE ONE TABLET BY MOUTH DAILY 90 tablet 3   Artificial Tear Solution (SOOTHE XP OP) Place 2 drops into both eyes daily as needed (dry eyes).     ascorbic acid (VITAMIN C) 500 MG tablet Take 1 tablet by mouth daily.     aspirin EC 81 MG tablet Take 1 tablet (81 mg total) by mouth daily. Swallow whole.     busPIRone (BUSPAR) 15 MG tablet Take 15 mg by mouth 2 (two) times daily. Patient taking 10 mg by mouth twice daily     calcium citrate (CALCITRATE - DOSED IN MG ELEMENTAL CALCIUM) 950 (200 Ca) MG tablet Take 200  mg of elemental calcium by mouth daily.     carvedilol (COREG) 3.125 MG tablet Take 1 tablet (3.125 mg total) by mouth 2 (two) times daily. 180 tablet 3   cetirizine (ZYRTEC) 10 MG tablet Take 1 tablet by mouth daily.     Cholecalciferol 50 MCG (2000 UT) TABS Take 1 tablet by mouth  daily.     Choline Fenofibrate 135 MG capsule Take 135 mg by mouth daily. (Patient not taking: Reported on 11/02/2022)     Cyanocobalamin (VITAMIN B-12) 5000 MCG SUBL Place under the tongue daily.     Docusate Sodium (DSS) 100 MG CAPS Take 2 capsules by mouth daily in the afternoon. 2-3 capsules daily     ELIQUIS 5 MG TABS tablet TAKE 1 TABLET BY MOUTH TWICE A DAY 180 tablet 1   empagliflozin (JARDIANCE) 10 MG TABS tablet Take 1 tablet (10 mg total) by mouth daily. 30 tablet 11   escitalopram (LEXAPRO) 10 MG tablet Take 1 tablet by mouth daily.     fluticasone (FLONASE) 50 MCG/ACT nasal spray Place 2 sprays into both nostrils daily. 16 g 6   furosemide (LASIX) 20 MG tablet Take 1 tablet (20 mg total) by mouth daily as needed (Take any day that your weight is over 185). 90 tablet 3   gabapentin (NEURONTIN) 300 MG capsule Take 1 capsule (300 mg total) by mouth 2 (two) times daily. 180 capsule 1   guaiFENesin (MUCINEX) 600 MG 12 hr tablet Take 1 tablet by mouth 2 (two) times daily.     HYDROcodone-acetaminophen (NORCO/VICODIN) 5-325 MG tablet Take 0.5 tablets by mouth every 6 (six) hours as needed for moderate pain. (Patient not taking: Reported on 11/02/2022) 25 tablet 0   Ipratropium-Albuterol (COMBIVENT RESPIMAT) 20-100 MCG/ACT AERS respimat Inhale 1 puff into the lungs every 6 (six) hours. Shortness of breath or wheezing 4 g 5   IRON PO Take 1 tablet by mouth daily.     MAGNESIUM-OXIDE 400 (241.3 Mg) MG tablet TAKE 1 TABSULE BY MOUTH DAILY (Patient taking differently: Take 400 mg by mouth daily.) 90 tablet 3   mexiletine (MEXITIL) 200 MG capsule Take 1 capsule (200 mg total) by mouth 2 (two) times daily. 180 capsule 3    Multiple Vitamins-Minerals (CENTRUM ADULTS PO) Take by mouth daily.     nitroGLYCERIN (NITROSTAT) 0.4 MG SL tablet Place 1 tablet (0.4 mg total) under the tongue every 5 (five) minutes as needed for chest pain. DISSOLVE 1 TABLET UNDER THE TONGUE EVERY 5 MINUTES FOR 3 DOSES 25 tablet 3   omeprazole (PRILOSEC) 20 MG capsule Take 20 mg by mouth daily.      polyethylene glycol powder (GLYCOLAX/MIRALAX) 17 GM/SCOOP powder Take 1 capful in 8 ounces of fluid each morning 255 g 0   Respiratory Therapy Supplies (FLUTTER) DEVI Use as directed (Patient taking differently: 1 each by Other route See admin instructions. Use as directed) 1 each 0   rosuvastatin (CRESTOR) 40 MG tablet TAKE ONE TABLET BY MOUTH DAILY (Patient not taking: Reported on 11/02/2022) 90 tablet 3   sacubitril-valsartan (ENTRESTO) 24-26 MG Take 1 tablet by mouth 2 (two) times daily. 60 tablet 11   Sennosides-Docusate Sodium (STOOL SOFTENER/LAXATIVE PO) Take 2 tablets by mouth daily.     Spacer/Aero-Holding Chambers (OPTICHAMBER DIAMOND) MISC optichamber [facility] (Patient taking differently: 1 each by Other route See admin instructions. optichamber [facility]) 1 each 0   spironolactone (ALDACTONE) 25 MG tablet Take 0.5 tablets (12.5 mg total) by mouth daily. 45 tablet 3   STIOLTO RESPIMAT 2.5-2.5 MCG/ACT AERS Inhale 2 puffs into the lungs daily.     tamsulosin (FLOMAX) 0.4 MG CAPS capsule TAKE ONE CAPSULE BY MOUTH DAILY AFTER SUPPER (Patient taking differently: Take 0.4 mg by mouth See admin instructions. TAKE ONE CAPSULE BY MOUTH DAILY AFTER SUPPER) 90 capsule 3   No current facility-administered medications  for this visit.    REVIEW OF SYSTEMS:   [X]  denotes positive finding, [ ]  denotes negative finding Cardiac  Comments:  Chest pain or chest pressure:    Shortness of breath upon exertion:    Short of breath when lying flat:    Irregular heart rhythm:        Vascular    Pain in calf, thigh, or hip brought on by ambulation:    Pain in feet at  night that wakes you up from your sleep:     Blood clot in your veins:    Leg swelling:         Pulmonary    Oxygen at home:    Productive cough:     Wheezing:         Neurologic    Sudden weakness in arms or legs:     Sudden numbness in arms or legs:     Sudden onset of difficulty speaking or slurred speech:    Temporary loss of vision in one eye:     Problems with dizziness:         Gastrointestinal    Blood in stool:      Vomited blood:         Genitourinary    Burning when urinating:     Blood in urine:        Psychiatric    Major depression:         Hematologic    Bleeding problems:    Problems with blood clotting too easily:        Skin    Rashes or ulcers:        Constitutional    Fever or chills:     PHYSICAL EXAM:   There were no vitals filed for this visit.  GENERAL: The patient is a well-nourished male, in no acute distress. The vital signs are documented above. CARDIAC: There is a regular rate and rhythm.  PULMONARY: Nonlabored respirations  MUSCULOSKELETAL: There are no major deformities or cyanosis. NEUROLOGIC: No focal weakness or paresthesias are detected. SKIN: There are no ulcers or rashes noted. PSYCHIATRIC: The patient has a normal affect.  STUDIES:   I have reviewed the following carotid duplex: Right Carotid: Velocities in the right ICA are consistent with a 1-39%  stenosis.   Left Carotid: Velocities in the left ICA are consistent with a 1-39%  stenosis.   Vertebrals: Bilateral vertebral arteries demonstrate antegrade flow.  Subclavians: Normal flow hemodynamics were seen in the left subclavian  artery.              Right subclavian appears monophasic, limited visualization.   ASSESSMENT and PLAN   NYHA class II-III: The patient is on goal-directed medical therapy and has persistent symptoms including shortness of breath and fatigue and low energy.  His carotid duplex shows no significant stenosis.  I think he would be an  excellent candidate for Barostim therapy.  I discussed in detail with the patient and his wife the operation as well as the risks and benefits.  All of her questions were answered.  We will work on Therapist, occupational to get him scheduled.  He will need to be off of his Eliquis for the procedure.   Charlena Cross, MD, FACS [facility] [phone] Pager [phone]

## 2022-11-23 NOTE — H&P (View-Only) (Signed)
[facility]ist of [place]  Patient name: Steven Guzman MRN: [id number] DOB: 1940-09-19 Sex: male   REQUESTING PROVIDER:    Dr. Royann Shivers   REASON FOR CONSULT:    Barostim evaluation  HISTORY OF PRESENT ILLNESS:   Steven Guzman is a 82 y.o. male, with NYHA class II- IIIa combined systolic and diastolic heart failure who is referred for Barostim evaluation.  The patient's most recent ejection fraction was 30%.  He has a history of coronary artery disease, status post CABG.  He has a ICD in place.  He takes Eliquis for atrial fibrillation.  He is on a statin for hypercholesterolemia.  He is medically managed for hypertension.  He is euvolemic.  His biggest complaints are fatigue, lack of energy, and shortness of breath.  He does have COPD.  PAST MEDICAL HISTORY    Past Medical History:  Diagnosis Date   AICD (automatic cardioverter/defibrillator) present    [employer]   Aneurysm [facility])    a. Aneurysmal infrarenal aorta up to 33 mm on CT 10/2014, recommended f/u due 10/2017   Anginal pain (HCC)    Anxiety    Basal cell carcinoma of nose    S/P MOHS   Biliary acute pancreatitis    CAD (coronary artery disease)    a. s/p MI in 1994 with PCI to LAD at that time b. cath 10/2012 demonstrated EF 30%, inferior akinesis with mild hypokinesis of all walls, patent LAD and RCA stents; ostial PDA with 80-90% obstruction with medical therapy recommended    Chronic systolic CHF (congestive heart failure) (HCC)    EF 30 to 35 % as of 09/2014.    CKD (chronic kidney disease), stage III (HCC)    Complication of anesthesia 10/2014   "had to have defibrillator w/ERCP"   COPD (chronic obstructive pulmonary disease) (HCC)    a. followed by pulmonary, COPD GOLD stage II   Depression    Diverticulosis of colon 07/2014   noted on CT   GERD (gastroesophageal reflux disease)    Hiatal hernia    Hyperglycemia 10/2012   Hyperlipidemia     Hypertension    Myocardial infarction [facility]) 1994; 2011   Pneumonia 1946; 2015   Prostate enlargement 07/2014   observed on CT   Tobacco abuse    Ventricular tachycardia (HCC)    a. 08/2009 s/p BSX E110 Teligen 100 AICD, ser#: [id number];  b. 08/2008 VT req ATP - detection reprogrammed from 160 to 150. c. EPS and VT ablation by Dr. Ladona Ridgel 12/21/2014     FAMILY HISTORY   Family History  Problem Relation Age of Onset   Heart attack Brother    CAD Father    Hypertension Father    CAD Mother    Hypertension Mother    Hypertension Brother    Stroke Neg Hx     SOCIAL HISTORY:   Social History   Socioeconomic History   Marital status: Married    Spouse name: Not on file   Number of children: Not on file   Years of education: Not on file   Highest education level: Not on file  Occupational History   Occupation: Retired  Tobacco Use   Smoking status: Former    Current packs/day: 1.00    Average packs/day: 1 pack/day for 55.0 years (55.0 ttl pk-yrs)    Types: Cigarettes   Smokeless tobacco: Never   Tobacco comments:    off/on, always ready to quit but does not work out  Vaping Use   Vaping status: Never Used  Substance and Sexual Activity   Alcohol use: Yes    Alcohol/week: 0.0 standard drinks of alcohol    Comment: occ   Drug use: No   Sexual activity: Not Currently  Other Topics Concern   Not on file  Social History Narrative   Not on file   Social Determinants of Health   Financial Resource Strain: Medium Risk (03/06/2021)   Overall Financial Resource Strain (CARDIA)    Difficulty of Paying Living Expenses: Somewhat hard  Food Insecurity: No Food Insecurity (04/13/2022)   Hunger Vital Sign    Worried About Running Out of Food in the Last Year: Never true    Ran Out of Food in the Last Year: Never true  Transportation Needs: No Transportation Needs (04/13/2022)   PRAPARE - Administrator, Civil Service (Medical): No    Lack of Transportation (Non-Medical):  No  Physical Activity: Insufficiently Active (03/06/2021)   Exercise Vital Sign    Days of Exercise per Week: 4 days    Minutes of Exercise per Session: 20 min  Stress: No Stress Concern Present (12/27/2021)   [facility] prior to 05/30/2022[facility] visits prior to 05/30/2022., [facility]   [employer] of Occupational Health - Occupational Stress Questionnaire    Feeling of Stress : Not at all  Social Connections: Unknown (11/08/2021)   Received from [facility]   Social Network    Social Network: Not on file  Intimate Partner Violence: Not At Risk (09/12/2022)   Received from [facility]   HITS    Over the last 12 months how often did your partner physically hurt you?: 1    Over the last 12 months how often did your partner insult you or talk down to you?: 1    Over the last 12 months how often did your partner threaten you with physical harm?: 1    Over the last 12 months how often did your partner scream or curse at you?: 1    ALLERGIES:    Allergies  Allergen Reactions   Sulfa Antibiotics Hives    CURRENT MEDICATIONS:    Current Outpatient Medications  Medication Sig Dispense Refill   albuterol (PROVENTIL) (2.5 MG/3ML) 0.083% nebulizer solution USE 1 VIAL VIA NEBULIZER EVERY 6 HOURS AS NEEDED FOR WHEEZING OR SHORTNESS OF BREATH 360 mL 0   amiodarone (PACERONE) 200 MG tablet TAKE ONE TABLET BY MOUTH DAILY 90 tablet 3   Artificial Tear Solution (SOOTHE XP OP) Place 2 drops into both eyes daily as needed (dry eyes).     ascorbic acid (VITAMIN C) 500 MG tablet Take 1 tablet by mouth daily.     aspirin EC 81 MG tablet Take 1 tablet (81 mg total) by mouth daily. Swallow whole.     busPIRone (BUSPAR) 15 MG tablet Take 15 mg by mouth 2 (two) times daily. Patient taking 10 mg by mouth twice daily     calcium citrate (CALCITRATE - DOSED IN MG ELEMENTAL CALCIUM) 950 (200 Ca) MG tablet Take 200  mg of elemental calcium by mouth daily.     carvedilol (COREG) 3.125 MG tablet Take 1 tablet (3.125 mg total) by mouth 2 (two) times daily. 180 tablet 3   cetirizine (ZYRTEC) 10 MG tablet Take 1 tablet by mouth daily.     Cholecalciferol 50 MCG (2000 UT) TABS Take 1 tablet by mouth  daily.     Choline Fenofibrate 135 MG capsule Take 135 mg by mouth daily. (Patient not taking: Reported on 11/02/2022)     Cyanocobalamin (VITAMIN B-12) 5000 MCG SUBL Place under the tongue daily.     Docusate Sodium (DSS) 100 MG CAPS Take 2 capsules by mouth daily in the afternoon. 2-3 capsules daily     ELIQUIS 5 MG TABS tablet TAKE 1 TABLET BY MOUTH TWICE A DAY 180 tablet 1   empagliflozin (JARDIANCE) 10 MG TABS tablet Take 1 tablet (10 mg total) by mouth daily. 30 tablet 11   escitalopram (LEXAPRO) 10 MG tablet Take 1 tablet by mouth daily.     fluticasone (FLONASE) 50 MCG/ACT nasal spray Place 2 sprays into both nostrils daily. 16 g 6   furosemide (LASIX) 20 MG tablet Take 1 tablet (20 mg total) by mouth daily as needed (Take any day that your weight is over 185). 90 tablet 3   gabapentin (NEURONTIN) 300 MG capsule Take 1 capsule (300 mg total) by mouth 2 (two) times daily. 180 capsule 1   guaiFENesin (MUCINEX) 600 MG 12 hr tablet Take 1 tablet by mouth 2 (two) times daily.     HYDROcodone-acetaminophen (NORCO/VICODIN) 5-325 MG tablet Take 0.5 tablets by mouth every 6 (six) hours as needed for moderate pain. (Patient not taking: Reported on 11/02/2022) 25 tablet 0   Ipratropium-Albuterol (COMBIVENT RESPIMAT) 20-100 MCG/ACT AERS respimat Inhale 1 puff into the lungs every 6 (six) hours. Shortness of breath or wheezing 4 g 5   IRON PO Take 1 tablet by mouth daily.     MAGNESIUM-OXIDE 400 (241.3 Mg) MG tablet TAKE 1 TABSULE BY MOUTH DAILY (Patient taking differently: Take 400 mg by mouth daily.) 90 tablet 3   mexiletine (MEXITIL) 200 MG capsule Take 1 capsule (200 mg total) by mouth 2 (two) times daily. 180 capsule 3    Multiple Vitamins-Minerals (CENTRUM ADULTS PO) Take by mouth daily.     nitroGLYCERIN (NITROSTAT) 0.4 MG SL tablet Place 1 tablet (0.4 mg total) under the tongue every 5 (five) minutes as needed for chest pain. DISSOLVE 1 TABLET UNDER THE TONGUE EVERY 5 MINUTES FOR 3 DOSES 25 tablet 3   omeprazole (PRILOSEC) 20 MG capsule Take 20 mg by mouth daily.      polyethylene glycol powder (GLYCOLAX/MIRALAX) 17 GM/SCOOP powder Take 1 capful in 8 ounces of fluid each morning 255 g 0   Respiratory Therapy Supplies (FLUTTER) DEVI Use as directed (Patient taking differently: 1 each by Other route See admin instructions. Use as directed) 1 each 0   rosuvastatin (CRESTOR) 40 MG tablet TAKE ONE TABLET BY MOUTH DAILY (Patient not taking: Reported on 11/02/2022) 90 tablet 3   sacubitril-valsartan (ENTRESTO) 24-26 MG Take 1 tablet by mouth 2 (two) times daily. 60 tablet 11   Sennosides-Docusate Sodium (STOOL SOFTENER/LAXATIVE PO) Take 2 tablets by mouth daily.     Spacer/Aero-Holding Chambers (OPTICHAMBER DIAMOND) MISC optichamber [facility] (Patient taking differently: 1 each by Other route See admin instructions. optichamber [facility]) 1 each 0   spironolactone (ALDACTONE) 25 MG tablet Take 0.5 tablets (12.5 mg total) by mouth daily. 45 tablet 3   STIOLTO RESPIMAT 2.5-2.5 MCG/ACT AERS Inhale 2 puffs into the lungs daily.     tamsulosin (FLOMAX) 0.4 MG CAPS capsule TAKE ONE CAPSULE BY MOUTH DAILY AFTER SUPPER (Patient taking differently: Take 0.4 mg by mouth See admin instructions. TAKE ONE CAPSULE BY MOUTH DAILY AFTER SUPPER) 90 capsule 3   No current facility-administered medications  for this visit.    REVIEW OF SYSTEMS:   [X]  denotes positive finding, [ ]  denotes negative finding Cardiac  Comments:  Chest pain or chest pressure:    Shortness of breath upon exertion:    Short of breath when lying flat:    Irregular heart rhythm:        Vascular    Pain in calf, thigh, or hip brought on by ambulation:    Pain in feet at  night that wakes you up from your sleep:     Blood clot in your veins:    Leg swelling:         Pulmonary    Oxygen at home:    Productive cough:     Wheezing:         Neurologic    Sudden weakness in arms or legs:     Sudden numbness in arms or legs:     Sudden onset of difficulty speaking or slurred speech:    Temporary loss of vision in one eye:     Problems with dizziness:         Gastrointestinal    Blood in stool:      Vomited blood:         Genitourinary    Burning when urinating:     Blood in urine:        Psychiatric    Major depression:         Hematologic    Bleeding problems:    Problems with blood clotting too easily:        Skin    Rashes or ulcers:        Constitutional    Fever or chills:     PHYSICAL EXAM:   There were no vitals filed for this visit.  GENERAL: The patient is a well-nourished male, in no acute distress. The vital signs are documented above. CARDIAC: There is a regular rate and rhythm.  PULMONARY: Nonlabored respirations  MUSCULOSKELETAL: There are no major deformities or cyanosis. NEUROLOGIC: No focal weakness or paresthesias are detected. SKIN: There are no ulcers or rashes noted. PSYCHIATRIC: The patient has a normal affect.  STUDIES:   I have reviewed the following carotid duplex: Right Carotid: Velocities in the right ICA are consistent with a 1-39%  stenosis.   Left Carotid: Velocities in the left ICA are consistent with a 1-39%  stenosis.   Vertebrals: Bilateral vertebral arteries demonstrate antegrade flow.  Subclavians: Normal flow hemodynamics were seen in the left subclavian  artery.              Right subclavian appears monophasic, limited visualization.   ASSESSMENT and PLAN   NYHA class II-III: The patient is on goal-directed medical therapy and has persistent symptoms including shortness of breath and fatigue and low energy.  His carotid duplex shows no significant stenosis.  I think he would be an  excellent candidate for Barostim therapy.  I discussed in detail with the patient and his wife the operation as well as the risks and benefits.  All of her questions were answered.  We will work on Therapist, occupational to get him scheduled.  He will need to be off of his Eliquis for the procedure.   Charlena Cross, MD, FACS [facility]ists of [facility] [phone] Pager [phone]

## 2022-12-02 ENCOUNTER — Telehealth: Payer: Self-pay

## 2022-12-02 ENCOUNTER — Other Ambulatory Visit: Payer: Self-pay

## 2022-12-02 DIAGNOSIS — I5042 Chronic combined systolic (congestive) and diastolic (congestive) heart failure: Secondary | ICD-10-CM

## 2022-12-02 MED ORDER — MEXILETINE HCL 200 MG PO CAPS: 200.0000 mg | ORAL_CAPSULE | Freq: Two times a day (BID) | ORAL | 3 refills | Status: DC

## 2022-12-02 NOTE — Telephone Encounter (Signed)
Patient scheduled for right barostim with Dr. Myra Gianotti on 12/17/22 at [facility]. Nutritional therapist, Joey D. notified of date/time.

## 2022-12-09 ENCOUNTER — Other Ambulatory Visit: Payer: Self-pay | Admitting: Orthopedic Surgery

## 2022-12-11 ENCOUNTER — Other Ambulatory Visit: Payer: Self-pay | Admitting: Orthopedic Surgery

## 2022-12-16 ENCOUNTER — Other Ambulatory Visit: Payer: Self-pay

## 2022-12-16 ENCOUNTER — Encounter (HOSPITAL_COMMUNITY): Payer: Self-pay | Admitting: Surgery

## 2022-12-16 ENCOUNTER — Encounter: Payer: Self-pay | Admitting: Internal Medicine

## 2022-12-16 NOTE — Progress Notes (Addendum)
PCP - Jamie Kato, PA-C Cardiologist - Dr Rachelle Hora Croitoru EP - Dr Lewayne Bunting Pulmonary - Dr Jenita Seashore  Chest x-ray - 10/02/22 (2V) CE EKG - 09/02/22 Stress Test - n/a ECHO - 07/02/22 Cardiac Cath - 03/19/22  ICD Pacemaker - [employer] dual chamber.  Patient had a generator changeout on 04/20/22.   Last remote check was on 11/06/22 with normal device function.  Device orders were initiated and awaiting fax.  BS Rep. Joey Deakin was notified of surgery procedure, date and time.  Also notified Mallory in the OR (at OR desk) of pacemaker.    Sleep Study -  n/a CPAP - none  Diabetes - n/a  Blood Thinner Instructions:  Hold Eliquis for 3 days prior to procedure.  Last dose was on 12/14/22  Aspirin Instructions: Follow your surgeon's instructions on when to stop aspirin prior to surgery,  If no instructions were given by your surgeon then you will need to call the office for those instructions.  NPO  Anesthesia review: Yes  STOP now taking any Aspirin (unless otherwise instructed by your surgeon), Aleve, Naproxen, Ibuprofen, Motrin, Advil, Goody's, BC's, all herbal medications, fish oil, and all vitamins.   Coronavirus Screening Do you have any of the following symptoms:  Cough yes/no: No Fever (>100.19F)  yes/no: No Runny nose yes/no: No Sore throat yes/no: No Difficulty breathing/shortness of breath  Yes  Have you traveled in the last 14 days and where? yes/no: No  Patient verbalized understanding of instructions that were given via phone.

## 2022-12-16 NOTE — Progress Notes (Signed)
PERIOPERATIVE PRESCRIPTION FOR IMPLANTED CARDIAC DEVICE PROGRAMMING  Patient Information: Name:  Steven Guzman  DOB:  06-02-1940  MRN:  [id number]  Planned Procedure:  Insertion of Right Barostim  Surgeon:  Dr Coral Else  Date of Procedure:  12/17/22  Cautery will be used.  Position during surgery:  supine   Device Information:  Clinic EP Physician:  Lewayne Bunting, MD   Device Type:  Defibrillator Manufacturer and Phone #:  [employer]: [phone] Pacemaker Dependent?:  No. Date of Last Device Check:  11/06/22 Normal Device Function?:  Yes.    Electrophysiologist's Recommendations:  Have magnet available. Provide continuous ECG monitoring when magnet is used or reprogramming is to be performed.  Procedure will likely interfere with device function.  Device should be programmed:  Tachy therapies disabled  Per Device Clinic Standing Orders, Skip Mayer, RN  2:48 PM 12/16/2022

## 2022-12-17 ENCOUNTER — Ambulatory Visit (HOSPITAL_COMMUNITY)
Admission: RE | Admit: 2022-12-17 | Discharge: 2022-12-17 | Disposition: A | Discharge status: Home / Self Care | Payer: Medicare Other | Attending: Surgery | Admitting: Surgery

## 2022-12-17 ENCOUNTER — Other Ambulatory Visit: Payer: Self-pay

## 2022-12-17 ENCOUNTER — Ambulatory Visit (HOSPITAL_BASED_OUTPATIENT_CLINIC_OR_DEPARTMENT_OTHER): Payer: Medicare Other | Admitting: Physician Assistant

## 2022-12-17 ENCOUNTER — Encounter (HOSPITAL_COMMUNITY)
Admission: RE | Disposition: A | Discharge status: Home / Self Care | Payer: Self-pay | Source: Home / Self Care | Attending: Surgery

## 2022-12-17 ENCOUNTER — Ambulatory Visit (HOSPITAL_COMMUNITY): Payer: Medicare Other | Admitting: Physician Assistant

## 2022-12-17 ENCOUNTER — Encounter (HOSPITAL_COMMUNITY): Payer: Self-pay | Admitting: Surgery

## 2022-12-17 DIAGNOSIS — I2510 Atherosclerotic heart disease of native coronary artery without angina pectoris: Secondary | ICD-10-CM | POA: Insufficient documentation

## 2022-12-17 DIAGNOSIS — N183 Chronic kidney disease, stage 3 unspecified: Secondary | ICD-10-CM | POA: Insufficient documentation

## 2022-12-17 DIAGNOSIS — I130 Hypertensive heart and chronic kidney disease with heart failure and stage 1 through stage 4 chronic kidney disease, or unspecified chronic kidney disease: Secondary | ICD-10-CM | POA: Insufficient documentation

## 2022-12-17 DIAGNOSIS — I5040 Unspecified combined systolic (congestive) and diastolic (congestive) heart failure: Secondary | ICD-10-CM | POA: Insufficient documentation

## 2022-12-17 DIAGNOSIS — Z5986 Financial insecurity: Secondary | ICD-10-CM | POA: Diagnosis not present

## 2022-12-17 DIAGNOSIS — I5022 Chronic systolic (congestive) heart failure: Secondary | ICD-10-CM

## 2022-12-17 DIAGNOSIS — Z79899 Other long term (current) drug therapy: Secondary | ICD-10-CM | POA: Diagnosis not present

## 2022-12-17 DIAGNOSIS — I25119 Atherosclerotic heart disease of native coronary artery with unspecified angina pectoris: Secondary | ICD-10-CM

## 2022-12-17 DIAGNOSIS — J449 Chronic obstructive pulmonary disease, unspecified: Secondary | ICD-10-CM | POA: Diagnosis not present

## 2022-12-17 DIAGNOSIS — I5089 Other heart failure: Secondary | ICD-10-CM | POA: Diagnosis present

## 2022-12-17 DIAGNOSIS — Z87891 Personal history of nicotine dependence: Secondary | ICD-10-CM

## 2022-12-17 DIAGNOSIS — I5042 Chronic combined systolic (congestive) and diastolic (congestive) heart failure: Secondary | ICD-10-CM

## 2022-12-17 DIAGNOSIS — I480 Paroxysmal atrial fibrillation: Secondary | ICD-10-CM

## 2022-12-17 DIAGNOSIS — Z951 Presence of aortocoronary bypass graft: Secondary | ICD-10-CM | POA: Insufficient documentation

## 2022-12-17 DIAGNOSIS — Z95810 Presence of automatic (implantable) cardiac defibrillator: Secondary | ICD-10-CM | POA: Diagnosis not present

## 2022-12-17 DIAGNOSIS — Z7901 Long term (current) use of anticoagulants: Secondary | ICD-10-CM | POA: Insufficient documentation

## 2022-12-17 DIAGNOSIS — I110 Hypertensive heart disease with heart failure: Secondary | ICD-10-CM

## 2022-12-17 DIAGNOSIS — Z7984 Long term (current) use of oral hypoglycemic drugs: Secondary | ICD-10-CM | POA: Insufficient documentation

## 2022-12-17 DIAGNOSIS — I4891 Unspecified atrial fibrillation: Secondary | ICD-10-CM | POA: Insufficient documentation

## 2022-12-17 DIAGNOSIS — E7800 Pure hypercholesterolemia, unspecified: Secondary | ICD-10-CM | POA: Diagnosis not present

## 2022-12-17 HISTORY — DX: Unspecified osteoarthritis, unspecified site: M19.90

## 2022-12-17 HISTORY — DX: Dyspnea, unspecified: R06.00

## 2022-12-17 HISTORY — DX: Personal history of urinary calculi: Z87.442

## 2022-12-17 LAB — CBC
HCT: 42 % (ref 39.0–52.0)
Hemoglobin: 12.4 g/dL — ABNORMAL LOW (ref 13.0–17.0)
MCH: 28.7 pg (ref 26.0–34.0)
MCHC: 29.5 g/dL — ABNORMAL LOW (ref 30.0–36.0)
MCV: 97.2 fL (ref 80.0–100.0)
Platelets: 149 10*3/uL — ABNORMAL LOW (ref 150–400)
RBC: 4.32 MIL/uL (ref 4.22–5.81)
RDW: 19.7 % — ABNORMAL HIGH (ref 11.5–15.5)
WBC: 3.7 10*3/uL — ABNORMAL LOW (ref 4.0–10.5)
nRBC: 0 % (ref 0.0–0.2)

## 2022-12-17 LAB — ABO/RH: ABO/RH(D): B POS

## 2022-12-17 LAB — SURGICAL PCR SCREEN
MRSA, PCR: NEGATIVE
Staphylococcus aureus: NEGATIVE

## 2022-12-17 LAB — COMPREHENSIVE METABOLIC PANEL
ALT: 22 U/L (ref 0–44)
AST: 27 U/L (ref 15–41)
Albumin: 3.9 g/dL (ref 3.5–5.0)
Alkaline Phosphatase: 37 U/L — ABNORMAL LOW (ref 38–126)
Anion gap: 12 (ref 5–15)
BUN: 12 mg/dL (ref 8–23)
CO2: 25 mmol/L (ref 22–32)
Calcium: 9.5 mg/dL (ref 8.9–10.3)
Chloride: 99 mmol/L (ref 98–111)
Creatinine, Ser: 0.84 mg/dL (ref 0.61–1.24)
GFR, Estimated: >60 mL/min (ref >60)
Glucose, Bld: 106 mg/dL — ABNORMAL HIGH (ref 70–99)
Potassium: 4.3 mmol/L (ref 3.5–5.1)
Sodium: 136 mmol/L (ref 135–145)
Total Bilirubin: 1 mg/dL (ref 0.3–1.2)
Total Protein: 7.2 g/dL (ref 6.5–8.1)

## 2022-12-17 LAB — URINALYSIS, ROUTINE W REFLEX MICROSCOPIC
Bacteria, UA: NONE SEEN
Bilirubin Urine: NEGATIVE
Glucose, UA: >=500 mg/dL — AB
Hgb urine dipstick: NEGATIVE
Ketones, ur: NEGATIVE mg/dL
Leukocytes,Ua: NEGATIVE
Nitrite: NEGATIVE
Protein, ur: NEGATIVE mg/dL
Specific Gravity, Urine: 1.02 (ref 1.005–1.030)
pH: 5 (ref 5.0–8.0)

## 2022-12-17 LAB — TYPE AND SCREEN
ABO/RH(D): B POS
Antibody Screen: NEGATIVE

## 2022-12-17 LAB — APTT: aPTT: 30 seconds (ref 24–36)

## 2022-12-17 LAB — PROTIME-INR
INR: 0.9 (ref 0.8–1.2)
Prothrombin Time: 12.5 seconds (ref 11.4–15.2)

## 2022-12-17 SURGERY — INSERTION, CAROTID SINUS BAROREFLEX ACTIVATION DEVICE
Anesthesia: General | Laterality: Right

## 2022-12-17 MED ORDER — DEXAMETHASONE SODIUM PHOSPHATE 10 MG/ML IJ SOLN
INTRAMUSCULAR | Status: DC | PRN
  Administered 2022-12-17: 10 mg via INTRAVENOUS

## 2022-12-17 MED ORDER — SODIUM CHLORIDE 0.9 % IV SOLN
INTRAVENOUS | Status: DC | PRN
  Administered 2022-12-17: 500 mL

## 2022-12-17 MED ORDER — OXYCODONE HCL 5 MG/5ML PO SOLN: 5.0000 mg | Freq: Once | ORAL | Status: DC | PRN

## 2022-12-17 MED ORDER — ONDANSETRON HCL 4 MG/2ML IJ SOLN
INTRAMUSCULAR | Status: AC
  Filled 2022-12-17: qty 2

## 2022-12-17 MED ORDER — EPHEDRINE SULFATE (PRESSORS) 50 MG/ML IJ SOLN
INTRAMUSCULAR | Status: DC | PRN
Start: 2022-12-17 — End: 2022-12-17
  Administered 2022-12-17: 5 mg via INTRAVENOUS
  Administered 2022-12-17: 10 mg via INTRAVENOUS
  Administered 2022-12-17: 5 mg via INTRAVENOUS

## 2022-12-17 MED ORDER — ORAL CARE MOUTH RINSE: 15.0000 mL | Freq: Once | OROMUCOSAL | Status: AC

## 2022-12-17 MED ORDER — LIDOCAINE HCL (PF) 1 % IJ SOLN
INTRAMUSCULAR | Status: AC
  Filled 2022-12-17: qty 30

## 2022-12-17 MED ORDER — SODIUM CHLORIDE 0.9 % IV SOLN: INTRAVENOUS | Status: DC

## 2022-12-17 MED ORDER — CHLORHEXIDINE GLUCONATE 0.12 % MT SOLN
OROMUCOSAL | Status: AC
  Administered 2022-12-17: 15 mL via OROMUCOSAL
  Filled 2022-12-17: qty 15

## 2022-12-17 MED ORDER — ONDANSETRON HCL 4 MG/2ML IJ SOLN
INTRAMUSCULAR | Status: DC | PRN
  Administered 2022-12-17: 4 mg via INTRAVENOUS

## 2022-12-17 MED ORDER — FENTANYL CITRATE (PF) 250 MCG/5ML IJ SOLN
INTRAMUSCULAR | Status: AC
  Filled 2022-12-17: qty 5

## 2022-12-17 MED ORDER — SODIUM CHLORIDE 0.9 % IV SOLN
0.0125 ug/kg/min | INTRAVENOUS | Status: AC
  Administered 2022-12-17: .1 ug/kg/min via INTRAVENOUS
  Filled 2022-12-17: qty 2000

## 2022-12-17 MED ORDER — CHLORHEXIDINE GLUCONATE CLOTH 2 % EX PADS: 6.0000 | MEDICATED_PAD | Freq: Once | CUTANEOUS | Status: DC

## 2022-12-17 MED ORDER — FENTANYL CITRATE (PF) 100 MCG/2ML IJ SOLN: 25.0000 ug | INTRAMUSCULAR | Status: DC | PRN

## 2022-12-17 MED ORDER — APIXABAN 5 MG PO TABS
5.0000 mg | ORAL_TABLET | Freq: Two times a day (BID) | ORAL | 1 refills | Status: DC
Start: 2022-12-18 — End: 2024-01-11

## 2022-12-17 MED ORDER — 0.9 % SODIUM CHLORIDE (POUR BTL) OPTIME
TOPICAL | Status: DC | PRN
  Administered 2022-12-17: 1000 mL

## 2022-12-17 MED ORDER — CHLORHEXIDINE GLUCONATE 0.12 % MT SOLN: 15.0000 mL | Freq: Once | OROMUCOSAL | Status: AC

## 2022-12-17 MED ORDER — SUGAMMADEX SODIUM 200 MG/2ML IV SOLN
INTRAVENOUS | Status: DC | PRN
  Administered 2022-12-17: 40 mg via INTRAVENOUS
  Administered 2022-12-17: 160 mg via INTRAVENOUS

## 2022-12-17 MED ORDER — ONDANSETRON HCL 4 MG/2ML IJ SOLN: 4.0000 mg | Freq: Four times a day (QID) | INTRAMUSCULAR | Status: DC | PRN

## 2022-12-17 MED ORDER — CEFAZOLIN SODIUM-DEXTROSE 2-4 GM/100ML-% IV SOLN
2.0000 g | INTRAVENOUS | Status: DC
  Filled 2022-12-17: qty 100

## 2022-12-17 MED ORDER — LACTATED RINGERS IV SOLN: INTRAVENOUS | Status: DC

## 2022-12-17 MED ORDER — ETOMIDATE 2 MG/ML IV SOLN
INTRAVENOUS | Status: DC | PRN
Start: 2022-12-17 — End: 2022-12-17
  Administered 2022-12-17: 20 mg via INTRAVENOUS

## 2022-12-17 MED ORDER — DEXAMETHASONE SODIUM PHOSPHATE 10 MG/ML IJ SOLN
INTRAMUSCULAR | Status: AC
  Filled 2022-12-17: qty 1

## 2022-12-17 MED ORDER — ROCURONIUM BROMIDE 10 MG/ML (PF) SYRINGE
PREFILLED_SYRINGE | INTRAVENOUS | Status: DC | PRN
  Administered 2022-12-17: 20 mg via INTRAVENOUS
  Administered 2022-12-17: 50 mg via INTRAVENOUS

## 2022-12-17 MED ORDER — OXYCODONE HCL 5 MG PO TABS: 5.0000 mg | ORAL_TABLET | Freq: Once | ORAL | Status: DC | PRN

## 2022-12-17 MED ORDER — PHENYLEPHRINE HCL-NACL 20-0.9 MG/250ML-% IV SOLN
INTRAVENOUS | Status: DC | PRN
  Administered 2022-12-17: 30 ug/min via INTRAVENOUS

## 2022-12-17 MED ORDER — LIDOCAINE 2% (20 MG/ML) 5 ML SYRINGE
INTRAMUSCULAR | Status: DC | PRN
  Administered 2022-12-17: 60 mg via INTRAVENOUS

## 2022-12-17 MED ORDER — OXYCODONE-ACETAMINOPHEN 5-325 MG PO TABS
1.0000 | ORAL_TABLET | Freq: Four times a day (QID) | ORAL | 0 refills | Status: DC | PRN
Start: 2022-12-17 — End: 2023-02-16

## 2022-12-17 MED ORDER — FENTANYL CITRATE (PF) 250 MCG/5ML IJ SOLN
INTRAMUSCULAR | Status: DC | PRN
  Administered 2022-12-17: 50 ug via INTRAVENOUS

## 2022-12-17 SURGICAL SUPPLY — 40 items
ADH SKN CLS APL DERMABOND .7 (GAUZE/BANDAGES/DRESSINGS) ×2
APL PRP STRL LF DISP 70% ISPRP (MISCELLANEOUS) ×1
BAG COUNTER SPONGE SURGICOUNT (BAG) ×1 IMPLANT
BAG SPNG CNTER NS LX DISP (BAG) ×1
CANISTER SUCT 3000ML PPV (MISCELLANEOUS) ×1 IMPLANT
CHLORAPREP W/TINT 26 (MISCELLANEOUS) ×1 IMPLANT
CLIP TI MEDIUM 6 (CLIP) IMPLANT
CLIP TI WIDE RED SMALL 6 (CLIP) IMPLANT
COVER PROBE W GEL 5X96 (DRAPES) ×1 IMPLANT
DERMABOND ADVANCED .7 DNX12 (GAUZE/BANDAGES/DRESSINGS) ×1 IMPLANT
ELECT REM PT RETURN 9FT ADLT (ELECTROSURGICAL) ×1
ELECTRODE REM PT RTRN 9FT ADLT (ELECTROSURGICAL) ×1 IMPLANT
GLOVE SURG SS PI 7.5 STRL IVOR (GLOVE) ×1 IMPLANT
GOWN STRL REUS W/ TWL LRG LVL3 (GOWN DISPOSABLE) ×2 IMPLANT
GOWN STRL REUS W/ TWL XL LVL3 (GOWN DISPOSABLE) ×1 IMPLANT
GOWN STRL REUS W/TWL LRG LVL3 (GOWN DISPOSABLE) ×2
GOWN STRL REUS W/TWL XL LVL3 (GOWN DISPOSABLE) ×1
HEMOSTAT SNOW SURGICEL 2X4 (HEMOSTASIS) IMPLANT
KIT BASIN OR (CUSTOM PROCEDURE TRAY) ×1 IMPLANT
KIT TURNOVER KIT B (KITS) ×1 IMPLANT
MARKER SKIN DUAL TIP RULER LAB (MISCELLANEOUS) ×1 IMPLANT
NDL 18GX1X1/2 (RX/OR ONLY) (NEEDLE) ×1 IMPLANT
NEEDLE 18GX1X1/2 (RX/OR ONLY) (NEEDLE) ×1 IMPLANT
NS IRRIG 1000ML POUR BTL (IV SOLUTION) ×2 IMPLANT
PACK CAROTID (CUSTOM PROCEDURE TRAY) ×1 IMPLANT
PAD ARMBOARD 7.5X6 YLW CONV (MISCELLANEOUS) ×2 IMPLANT
POSITIONER HEAD DONUT 9IN (MISCELLANEOUS) ×1 IMPLANT
SUT ETHIBOND CT1 BRD #0 30IN (SUTURE) ×2 IMPLANT
SUT ETHILON 3 0 PS 1 (SUTURE) IMPLANT
SUT PROLENE 6 0 BV (SUTURE) ×8 IMPLANT
SUT SILK 0 FSL (SUTURE) IMPLANT
SUT VIC AB 3-0 SH 27 (SUTURE) ×2
SUT VIC AB 3-0 SH 27X BRD (SUTURE) ×2 IMPLANT
SUT VIC AB 4-0 PS2 18 (SUTURE) IMPLANT
SUT VICRYL 4-0 PS2 18IN ABS (SUTURE) ×2 IMPLANT
SYR 5ML LL (SYRINGE) ×1 IMPLANT
SYR BULB IRRIG 60ML STRL (SYRINGE) ×1 IMPLANT
SYSTEM IPG BAROSTIM 2104 (Generator) IMPLANT
TOWEL GREEN STERILE (TOWEL DISPOSABLE) ×1 IMPLANT
WATER STERILE IRR 1000ML POUR (IV SOLUTION) ×1 IMPLANT

## 2022-12-17 NOTE — Interval H&P Note (Signed)
History and Physical Interval Note:  12/17/2022 7:26 AM  Steven Guzman  has presented today for surgery, with the diagnosis of Congestive heart failure.  The various methods of treatment have been discussed with the patient and family. After consideration of risks, benefits and other options for treatment, the patient has consented to  Procedure(s): INSERTION OF BAROSTIM (Right) as a surgical intervention.  The patient's history has been reviewed, patient examined, no change in status, stable for surgery.  I have reviewed the patient's chart and labs.  Questions were answered to the patient's satisfaction.     Durene Cal

## 2022-12-17 NOTE — Op Note (Signed)
Patient name: Steven Guzman MRN: [id number] DOB: [birth year] Sex: male  12/17/2022 Pre-operative Diagnosis: NYHA class III heart failure Post-operative diagnosis:  Same Surgeon:  Durene Cal Assistants:  Aggie Moats, PA Procedure:   Insertion of right sided Barostim Anesthesia:  General Blood Loss:  minimal Specimens:  none  Findings:  device at position "A" SN [id number], model 1036 SN [id number], model 2104  Indications: This is an 82 year old gentleman with NYHA class III heart failure with symptoms of shortness of breath weakness and low energy despite goal-directed medical therapy.  We discussed placement of a Baro device which he wants to proceed with.  Procedure:  The patient was identified in the holding area and taken to [facility] OR ROOM 11  The patient was then placed supine on the table. general anesthesia was administered.  The patient was prepped and draped in the usual sterile fashion.  A time out was called and antibiotics were administered.  A PA was necessary to expedite the procedure and assist with technical details.  He helped with securing the device on the carotid artery, wound irrigation, retraction, as well as wound closure.  The right carotid artery was evaluated with ultrasound and the bifurcation was noted.  I made a longitudinal incision along the anterior border of the sternocleidomastoid for about 3 cm.  Cautery was used to divide the subcutaneous tissue and platysma muscle.  I then sharply dissected down to the common facial vein which was divided between silk ties.  This exposed the carotid bifurcation.  Next, a infraclavicular incision was made with a 10 blade.  Cautery was used to divide down to the pectoral fascia.  I then created a tunnel for the generator on top of the pectoral fascia.  Next, a tonsil clamp was used to tunnel between the 2 incisions.  The tubing was brought through the tunnel and connected to the generator which was placed in the pocket.   We then proceeded with hemodynamic testing.  He had a good blood pressure response and position A.  The device was then sutured into this position with six 6-0 Prolene sutures.  The strain relief loop was then sutured to the common carotid artery with two 6-0 Prolene sutures.  The wound was then irrigated with antibiotic solution.  Hemostasis was achieved.  The generator was sutured to the pectoral fascia with 2-0 Ethibond sutures.  The neck incision was closed by reapproximating the carotid sheath with 3-0 Vicryl.  The platysma muscle was closed with 3-0 Vicryl followed by 4-0 Vicryl subcuticular closure.  The chest incision was closed by reapproximating the subcutaneous tissue with 3-0 Vicryl followed by 4 subcuticular closure.  Dermabond was placed on the incision.  The patient tolerated the procedure well.  There were no immediate complications.   Disposition: To PACU stable.   Juleen China, M.D., [facility] [place] Office: [phone] Pager:  [phone]

## 2022-12-17 NOTE — Transfer of Care (Signed)
Immediate Anesthesia Transfer of Care Note  Patient: Steven Guzman  Procedure(s) Performed: INSERTION OF RIGHT BAROSTIM (Right)  Patient Location: PACU  Anesthesia Type:General  Level of Consciousness: awake and alert   Airway & Oxygen Therapy: Patient Spontanous Breathing and Patient connected to nasal cannula oxygen  Post-op Assessment: Report given to RN and Post -op Vital signs reviewed and stable  Post vital signs: Reviewed and stable  Last Vitals:  Vitals Value Taken Time  BP 131/78 12/17/22 0930  Temp 36.4 C 12/17/22 0930  Pulse 60 12/17/22 0940  Resp 21 12/17/22 0940  SpO2 91 % 12/17/22 0940  Vitals shown include unfiled device data.  Last Pain:  Vitals:   12/17/22 0930  TempSrc:   PainSc: 0-No pain         Complications: No notable events documented.

## 2022-12-17 NOTE — Anesthesia Procedure Notes (Signed)
Arterial Line Insertion Start/End9/19/2024 7:15 AM, 12/17/2022 7:20 AM Performed by: Sheppard Evens, CRNA, CRNA  Preanesthetic checklist: patient identified, IV checked, risks and benefits discussed, surgical consent and pre-op evaluation Lidocaine 1% used for infiltration Left, radial was placed Catheter size: 20 G Hand hygiene performed  and maximum sterile barriers used   Attempts: 1 Procedure performed without using ultrasound guided technique. Ultrasound Notes:anatomy identified Following insertion, dressing applied and Biopatch.

## 2022-12-17 NOTE — OR Nursing (Signed)
1.428mg  Remifentanil wasted in Stericycle, witnessed by C. Maness, CRNA and myself.

## 2022-12-17 NOTE — Anesthesia Procedure Notes (Signed)
Procedure Name: Intubation Date/Time: 12/17/2022 7:51 AM  Performed by: Sheppard Evens, CRNAPre-anesthesia Checklist: Patient identified, Emergency Drugs available, Suction available and Patient being monitored Patient Re-evaluated:Patient Re-evaluated prior to induction Oxygen Delivery Method: Circle System Utilized Preoxygenation: Pre-oxygenation with 100% oxygen Induction Type: IV induction Ventilation: Mask ventilation without difficulty Laryngoscope Size: Mac and 3 Grade View: Grade I Tube type: Oral Tube size: 7.5 mm Number of attempts: 1 Airway Equipment and Method: Stylet and Oral airway Placement Confirmation: ETT inserted through vocal cords under direct vision, positive ETCO2 and breath sounds checked- equal and bilateral Secured at: 23 cm Tube secured with: Tape Dental Injury: Teeth and Oropharynx as per pre-operative assessment

## 2022-12-17 NOTE — Progress Notes (Signed)
Pt states he took 1 nitroglycerin tablet about a week ago for "poking" chest pain. Pt states he was in bed when this happened, and the nitroglycerin did not help the pain. Pt states that the pain went away on its own. Pt states he takes nitroglycerin about 1-2 times a year.   Pt took his Jardiance yesterday.   PPM/ICD Rep Joey with [facility] [employer] notified of orders to reprogram device. Joey to change therapies once patient is in the OR.   Dr. Chaney Malling notified.

## 2022-12-17 NOTE — Anesthesia Preprocedure Evaluation (Signed)
Anesthesia Evaluation  Patient identified by MRN, date of birth, ID band Patient awake    Reviewed: Allergy & Precautions, H&P , NPO status , Patient's Chart, lab work & pertinent test results  Airway Mallampati: II   Neck ROM: full    Dental   Pulmonary shortness of breath, COPD, Patient abstained from smoking., former smoker   breath sounds clear to auscultation       Cardiovascular hypertension, + angina  + CAD, + Past MI and +CHF  + Cardiac Defibrillator  Rhythm:regular Rate:Normal  EF 30-35%   Neuro/Psych  PSYCHIATRIC DISORDERS Anxiety Depression       GI/Hepatic ,GERD  ,,  Endo/Other    Renal/GU Renal InsufficiencyRenal disease     Musculoskeletal  (+) Arthritis ,    Abdominal   Peds  Hematology   Anesthesia Other Findings   Reproductive/Obstetrics                             Anesthesia Physical Anesthesia Plan  ASA: 3  Anesthesia Plan: General   Post-op Pain Management:    Induction: Intravenous  PONV Risk Score and Plan: 2 and Ondansetron, Dexamethasone and Treatment may vary due to age or medical condition  Airway Management Planned: Oral ETT  Additional Equipment:   Intra-op Plan:   Post-operative Plan: Extubation in OR  Informed Consent: I have reviewed the patients History and Physical, chart, labs and discussed the procedure including the risks, benefits and alternatives for the proposed anesthesia with the patient or authorized representative who has indicated his/her understanding and acceptance.     Dental advisory given  Plan Discussed with: CRNA, Anesthesiologist and Surgeon  Anesthesia Plan Comments:        Anesthesia Quick Evaluation

## 2022-12-20 NOTE — Anesthesia Postprocedure Evaluation (Signed)
Anesthesia Post Note  Patient: Steven Guzman  Procedure(s) Performed: INSERTION OF RIGHT BAROSTIM (Right)     Patient location during evaluation: PACU Anesthesia Type: General Level of consciousness: awake and alert Pain management: pain level controlled Vital Signs Assessment: post-procedure vital signs reviewed and stable Respiratory status: spontaneous breathing, nonlabored ventilation, respiratory function stable and patient connected to nasal cannula oxygen Cardiovascular status: blood pressure returned to baseline and stable Postop Assessment: no apparent nausea or vomiting Anesthetic complications: no   No notable events documented.  Last Vitals:  Vitals:   12/17/22 1045 12/17/22 1100  BP: 120/80 127/79  Pulse: (!) 56 (!) 54  Resp: 18 17  Temp:    SpO2: 91% 92%    Last Pain:  Vitals:   12/17/22 1100  TempSrc:   PainSc: 0-No pain                 Halona Amstutz S

## 2022-12-29 NOTE — Progress Notes (Deleted)
Cardiology Office Note:   Date:  12/29/2022  ID:  WOODSON KASDORF, DOB 01/15/1941, MRN [id number]  Primary Cardiologist: Thurmon Fair, MD Electrophysiologist: Lewayne Bunting, MD   History of Present Illness:   Steven Guzman is a 82 y.o. male with history of longstanding problems with coronary artery disease (Inferior MI complicated by cardiac arrest in 1995, PCI-stent LAD 2011, NSTEMI 2023 due to occlusion of OM 3 treated medically catheterization December 2023 showed patent stents in LAD and RCA, complete occlusion of OM 3, with chronic stable 80% stenosis of the PDA) and ischemic cardiomyopathy (most recent echo December 2023 LVEF 25-30% with inferior and inferolateral akinesis), previous VT requiring ICD implantation Conservation officer, historic buildings implanted 2010 in [place], generator change [facility] [employer] 04/20/2022) and VT ablation in 2016, on amiodarone therapy, paroxysmal atrial fibrillation on chronic Eliquis anticoagulation, small AAA, CKD stage III, COPD, hypertension, hypercholesterolemia, hiatal hernia/GERD and depression seen today for post hospital follow up.    Admitted 12/17/2022 for planned Barostim implantation (commercial)  Since discharge from hospital the patient reports doing ***.  he denies chest pain, palpitations, dyspnea, PND, orthopnea, nausea, vomiting, dizziness, syncope, edema, weight gain, or early satiety.   Device History: Barostim (standard) implanted 12/17/2022 for Chronic systolic CHF [employer] ICD 08/2009, gen change 03/2022  Review of systems complete and found to be negative unless listed in HPI.    EP Information / Studies Reviewed:    EKG is not ordered today. EKG from 09/02/2022 reviewed which showed NSR at 69 bpm   Barostim Interrogation- Performed personally and reviewed in detail today,  See scanned report  ICD/PPM interrogation - {Blank single:19197::"Not performed today. See last PaceArt report","Performed personally and reviewed in detail  today."}    Physical Exam:   VS:  There were no vitals taken for this visit.   Wt Readings from Last 3 Encounters:  12/17/22 177 lb 4 oz (80.4 kg)  11/23/22 172 lb (78 kg)  11/02/22 177 lb 3.2 oz (80.4 kg)     GEN: Well nourished, well developed in no acute distress NECK: No JVD; No carotid bruits CARDIAC: {EPRHYTHM:28826}, no murmurs, rubs, gallops RESPIRATORY:  Clear to auscultation without rales, wheezing or rhonchi  ABDOMEN: Soft, non-tender, non-distended EXTREMITIES:  No edema; No deformity   ASSESSMENT AND PLAN:    Chronic systolic CHF s/p Environmental manager and Barostim implantation NYHA *** symptoms. ***  Device titrated from *** millamp to *** milliamp by increments of 0.4 with good blood pressure response and no stimulation.  Device impedence stable. Pt goals are to ***  Normal device function See scanned report. Will follow up in *** weeks to continue titration with goal of 6-8 milliamps for chronic settings.   H/o VT On amiodarone Surveillance labs today    {Are you ordering a CV Procedure (e.g. stress test, cath, DCCV, TEE, etc)?   Press F2        :[id number]}   Disposition:   Follow up with {EPPROVIDERS:28135} in {EPFOLLOW UP:28173}  Signed, Graciella Freer, PA-C

## 2022-12-30 ENCOUNTER — Ambulatory Visit: Payer: Medicare Other | Admitting: Student

## 2023-01-18 ENCOUNTER — Telehealth: Payer: Self-pay

## 2023-01-18 NOTE — Telephone Encounter (Signed)
Pt called c/o L leg swelling over the past wk. He also stated that he had an injury to the Barostim insertion site.  Reviewed pt's chart, returned call for clarification, two identifiers used. Pt had gone to urgent care in [place] with a scratch on his knee and thought the swelling may be related to that. The scratch has healed, but the swelling has persisted. He has been wearing compressions nearly 24 hrs a day.  Pt was opening the car door and hit his chest against it. It has been sore, but he denies any open areas or bleeding. Instructed him to monitor and if any worsening symptoms, call back.  Instructed him to call cardiology and the device clinic to discuss leg swelling symptoms. Confirmed understanding.

## 2023-01-18 NOTE — Progress Notes (Unsigned)
Cardiology Office Note:    Date:  01/19/2023   ID:  Guzman, Steven 11-Jan-1941, MRN [id number]  PCP:  Laroy Apple, PA-C  Cardiologist:  Thurmon Fair, MD Electrophysiologist:  Lewayne Bunting, MD     Referring MD: Laroy Apple, [place]   Chief Complaint: lower extremity edema  History of Present Illness:    Steven Guzman is a 82 y.o. male with a history of CAD (inferior MI complicated by cardiac arrest in 1995 s/p stenting to RCA, PCI with stenting to LAD in 2011, NSTEMI in 02/2022 due to occlusion of OM3 treated medically), ischemic cardiomyopathy/ chronic HFrEF with EF of 30% on Echo in 06/2022 s/p Barostim in 11/2022, VT s/p [employer] ICD in 2010 (generator change in 03/2022) and ablation in 2016 on Amiodarone, paroxysmal atrial fibrillation on Eliquis, AAA, hypertension, hyperlipidemia, COPD, GERD, and hiatal hernia who is followed by Dr. Royann Shivers and Dr. Ladona Ridgel and presents today for evaluation of lower extremity edema.   Patient has a long history of CAD with prior interventions to RCA (in setting of inferior MI) in 1995 and LAD in 2011. Most recent ischemic evaluation was a left cardiac catheterization in 02/2022 in setting of NSTEMI which showed patents stents to RCA and LAD and 100% occlusion of distal OM3 (culprit lesion) as well as severe 80% stenosis of ostial PDA which was unchanged from 2014. Continued medical therapy was recommended at that time. He also has a history of ischemic cardiomyopathy/ chronic HFeEF and VT. He underwent placement of a [employer] ICD in 2010, VT ablation in 2016, and then subsequent ICD gen change in 03/2022. He has also been maintained on Amiodarone. Last Echo in 06/2022 showed LVEF of 30% with akinesis of basal inferoseptal, basal to mid inferior, and inferolateral walls and hypokinesis of anterolateral wall as well as mild LVH and grade 1 diastolic dysfunction, mildly enlarged RV with normal RV function, mild biatrial enlargement,  and no significant valvular disease. He has had chronic dyspnea on exertion for quite some time and underwent placement of right sided Barostim in 11/2022 to see if this would help.   He was recently seen at an [facility] on 01/07/2023 for a wound on his left knee and was diagnosed with cellulitis and started on Keflex. He was then seen by his PCP on 01/12/2023 at which time he continue to report some lower extremity edema and swelling and was started on Lasix 20mg  daily for 5 days.  He presents today for further evaluation of his lower extremity edema. He states the pain around left knee wound improved after antibiotics but the lower extremity edema never really improved. He also reports chronic right foot edema but this is stable. He has only taken one dose of Lasix 20mg  and noticed no real improvement with this. He states the only things that really seems to help his edema his compression stockings. He reports being compliant with his Eliquis so DVT is unlikely. No other acute CHF symptoms. He states his chronic dyspnea on exertion has improved with the Barostim. He has chronic but stable orthopnea and no PND. No chest pain. He denies any palpitations. He does report an episode of lightheadedness/ dizziness and feeling "jittery" yesterday afternoon after coming home from the store. He states he checked his vitals. BP was mildly elevated and HR was in the 90s (usually in the 50s to 60s). He took his evening dose of medications and symptoms resolved and HR returned to  usual range. He thinks he may of been in atrial fibrillation. He denies any palpitations with this. EKG today in the office shows normal sinus rhythm with PACs and PVCs (reviewed with Dr. Servando Salina).  EKGs/Labs/Other Studies Reviewed:    The following studies were reviewed:  Left Cardiac Catheterization 03/19/2022:    Mid LM to Dist LM lesion is 20% stenosed.   Ost LAD to Prox LAD lesion is 25% stenosed.   Prox LAD to Mid LAD  lesion is 35% stenosed.   3rd Mrg lesion is 100% stenosed.   Prox RCA to Mid RCA lesion is 40% stenosed.   RPDA lesion is 80% stenosed.   There is mild left ventricular systolic dysfunction.   LV end diastolic pressure is mildly elevated.   The left ventricular ejection fraction is 45-50% by visual estimate.   2 vessel obstructive CAD - 100% occlusion of distal OM3 appears to be the culprit. The ostial PDA lesion is unchanged from 2014 Patent stents in the LAD and RCA Mild LV dysfunction with inferior wall motion abnormality Mildly elevated LVEDP 16 mm Hg   Plan: recommend medical therapy  Diagnostic Dominance: Right   _______________  Echocardiogram 07/02/2022: Impressions: 1. Left ventricular ejection fraction, by estimation, is 30%. The left  ventricle has moderate to severely decreased function. The left ventricle  demonstrates regional wall motion abnormalities with basal inferoseptal  akinesis, basal to mid inferior  akinesis, inferolateral akinesis, anterolateral hypokinesis. The left  ventricular internal cavity size was mildly to moderately dilated. There  is mild concentric left ventricular hypertrophy. Left ventricular  diastolic parameters are consistent with  Grade I diastolic dysfunction (impaired relaxation).   2. Right ventricular systolic function is normal. The right ventricular  size is mildly enlarged. Tricuspid regurgitation signal is inadequate for  assessing PA pressure.   3. Left atrial size was mildly dilated.   4. Right atrial size was mildly dilated.   5. The mitral valve is normal in structure. Trivial mitral valve  regurgitation. No evidence of mitral stenosis.   6. The aortic valve is tricuspid. There is moderate calcification of the  aortic valve. Aortic valve regurgitation is trivial. No aortic stenosis is  present.   7. The inferior vena cava is normal in size with greater than 50%  respiratory variability, suggesting right atrial pressure of 3  mmHg.  _______________  Carotid Ultrasound 11/23/2022: Summary:  - Right Carotid: Velocities in the right ICA are consistent with a 1-39%  stenosis.  - Left Carotid: Velocities in the left ICA are consistent with a 1-39%  stenosis.  - Vertebrals: Bilateral vertebral arteries demonstrate antegrade flow.  - Subclavians: Normal flow hemodynamics were seen in the left subclavian  artery. Right subclavian appears monophasic, limited visualization.   EKG:  EKG ordered today.   EKG Interpretation Date/Time:  Tuesday January 19 2023 09:45:35 EDT Ventricular Rate:  80 PR Interval:    QRS Duration:  156 QT Interval:  448 QTC Calculation: 516 R Axis:   88  Text Interpretation: Normal sinus rhythm with PACs and PVCs Low voltage P waves Underlying artifact from Barostim No acute ST/ T wave changes compared to prior tracings Reconfirmed by Marjie Skiff [phone]) on 01/19/2023 5:01:58 PM    Recent Labs: 04/11/2022: Magnesium 2.1 06/04/2022: NT-Pro BNP 250; TSH 2.170 09/02/2022: BNP 58.8 12/17/2022: ALT 22; BUN 12; Creatinine, Ser 0.84; Hemoglobin 12.4; Platelets 149; Potassium 4.3; Sodium 136  Recent Lipid Panel    Component Value Date/Time   CHOL 162 03/19/2022 0205  CHOL 117 07/14/2021 1026   TRIG 75 03/19/2022 0205   HDL 44 03/19/2022 0205   HDL 47 07/14/2021 1026   CHOLHDL 3.7 03/19/2022 0205   VLDL 15 03/19/2022 0205   LDLCALC 103 (H) 03/19/2022 0205   LDLCALC 53 07/14/2021 1026   LDLCALC 67 03/01/2020 1344    Physical Exam:    Vital Signs: BP 122/70 (BP Location: Left Arm, Patient Position: Sitting, Cuff Size: Normal)   Pulse 80   Ht 5\' 9"  (1.753 m)   Wt 169 lb (76.7 kg)   SpO2 94%   BMI 24.96 kg/m     Wt Readings from Last 3 Encounters:  01/19/23 169 lb (76.7 kg)  12/17/22 177 lb 4 oz (80.4 kg)  11/23/22 172 lb (78 kg)     General: 82 y.o. Caucasian male in no acute distress. HEENT: Normocephalic and atraumatic. Sclera clear.  Neck: Supple. No JVD. Heart:  Irregularly irregular hrythm with normal rate.  Distinct S1 and S2. No murmurs, gallops, or rubs.  Lungs: No increased work of breathing. Clear to ausculation bilaterally. No wheezes, rhonchi, or rales.  Extremities: 1+ pitting edema of left lower extremity and trace edema of the right.  Radial and distal pedal pulses 2+ and equal bilaterally. Skin: Warm and dry. Neuro: No focal deficits. Psych: Normal affect. Responds appropriately.   Assessment:    1. Lower extremity edema   2. Coronary artery disease involving native coronary artery of native heart without angina pectoris   3. Ischemic cardiomyopathy   4. Chronic HFrEF (heart failure with reduced ejection fraction) (HCC)   5. VT (ventricular tachycardia) (HCC)   6. S/P ICD (internal cardiac defibrillator) procedure   7. Paroxysmal atrial fibrillation (HCC)   8. Primary hypertension   9. Hyperlipidemia, unspecified hyperlipidemia type     Plan:    Lower Extremity Edema Patient reports left lower extremity edema edema for the past couple of weeks that improves when wearing compression stockings.  - He has 1+ pitting edema on the left and trace edema on the right.  - PCP recently prescribed a short course of Lasix 20mg  but he has only taken one dose. Recommended Lasix 20mg  for 3 days and then can use only as needed for weight gain or worsening edema.  - Continue compression stockings. Also recommended elevating legs as much as possible and limiting salt intake.  - He reports compliance with Eliquis so I think DVT is unlikely. Can hold off on lower extremity venous doppler for now.   CAD Patient has a long history of CAD prior interventions to RCA (in setting of inferior MI) in 1995 and LAD in 2011. Most recent ischemic evaluation was a left cardiac catheterization in 02/2022 in setting of NSTEMI which showed patents stents to RCA and LAD and 100% occlusion of distal OM3 (culprit lesion) as well as severe 80% stenosis of ostial PDA  which was unchanged from 2014. Continued medical therapy was recommended at that time. - No angina. - Continue aspirin and statin.   Ischemic Cardiomyopathy Chronic HFrEF S/p Barostim in 11/2022 Last Echo in 06/2022 showed LVEF of 30% with akinesis of basal inferoseptal, basal to mid inferior, and inferolateral walls and hypokinesis of anterolateral wall as well as mild LVH and grade 1 diastolic dysfunction, mildly enlarged RV with normal RV function, mild biatrial enlargement, and no significant valvular disease. - Chronic dyspnea has improved with Barostim. However, he does report some lower extremity edema recently as described above.  - Will check BNP  and BMET. - Continue Lasix 20mg  daily for 3 days and can then use only as needed for weight gain and worsening lower extremity edema. - Continue Entresto 24-26mg  twice daily. - Continue Coreg 3.125mg  twice daily.  - Continue Spironolactone 12.5mg  daily. - Continue Jardiance 10mg  daily. - Discussed importance ot daily weight/ strict I/Os, and renal function.   VT s/p [employer] ICD S/p ICD in 2010 and then generator change in 03/2022. Also underwent a VT ablation in 2016.  - Continue Amiodarone 200mg  daily and Mexiletine 200mg  twice daily. - Continue Coreg 3.125mg  twice daily.  - Device management by Dr. Ladona Ridgel.  Paroxysmal Atrial Fibrillation He describes an episode of lightheadedness/ dizziness with increase in heart rate yesterday evening (please see HPI for more details). Question whether he could of been in atrial fibrillation.  - EKG today shows normal sinus rhythm with frequent PACs and PVCs. - Will check BMET, Magnesium, and TSH. - Continue Amiodarone 200mg  daily and Mexiletine 200mg  twice daily.  - Continue Coreg 3.125mg  twice daily.  - Continue Eliquis 5mg  twice daily.  - Asked patient to send in an ICD interrogation when he gets home to assess for any arrhythmias last night.   Hypertension BP well controlled.  -  Continue medications for CHF as above.  Hyperlipidemia Lipid panel in 02/2022: Total Cholesterol 162, Triglycerides 75, HLD 44, LDL 103. LDL goal <55 given multiple MIs. - Continue Crestor 40mg  daily.  - LDL is not at goal. Did not have time to discuss this today given acute concerns above. Will confirm that he has been taking Crestor when we call with his lab results tomorrow. If has been compliant with Crestor, will recommend we add Zetia.  Disposition: Follow up in 3-4 months.    Signed, Corrin Parker, PA-C  01/19/2023 5:20 PM    [facility]

## 2023-01-19 ENCOUNTER — Ambulatory Visit: Payer: Medicare Other | Attending: Cardiovascular Disease | Admitting: Student

## 2023-01-19 ENCOUNTER — Encounter: Payer: Self-pay | Admitting: Student

## 2023-01-19 VITALS — BP 122/70 | HR 80 | Ht 69.0 in | Wt 169.0 lb

## 2023-01-19 DIAGNOSIS — I255 Ischemic cardiomyopathy: Secondary | ICD-10-CM

## 2023-01-19 DIAGNOSIS — I2510 Atherosclerotic heart disease of native coronary artery without angina pectoris: Secondary | ICD-10-CM | POA: Diagnosis not present

## 2023-01-19 DIAGNOSIS — R600 Localized edema: Secondary | ICD-10-CM

## 2023-01-19 DIAGNOSIS — I480 Paroxysmal atrial fibrillation: Secondary | ICD-10-CM

## 2023-01-19 DIAGNOSIS — I5022 Chronic systolic (congestive) heart failure: Secondary | ICD-10-CM | POA: Diagnosis not present

## 2023-01-19 DIAGNOSIS — Z95810 Presence of automatic (implantable) cardiac defibrillator: Secondary | ICD-10-CM

## 2023-01-19 DIAGNOSIS — I472 Ventricular tachycardia, unspecified: Secondary | ICD-10-CM

## 2023-01-19 DIAGNOSIS — E785 Hyperlipidemia, unspecified: Secondary | ICD-10-CM

## 2023-01-19 DIAGNOSIS — I10 Essential (primary) hypertension: Secondary | ICD-10-CM

## 2023-01-19 NOTE — Patient Instructions (Signed)
Medication Instructions:  TAKE LASIX 20MG  x3 DAYS THEN TAKE AS NEEDED FOR WEIGHT GAIN >3 POUNDS OVERNIGHT OR >5 POUNDS OVERNIGHT *If you need a refill on your cardiac medications before your next appointment, please call your pharmacy*  Lab Work: BMET, BNP AND MAG TODAY If you have labs (blood work) drawn today and your tests are completely normal, you will receive your results only by:    MyChart Message (if you have MyChart) OR  A paper copy in the mail If you have any lab test that is abnormal or we need to change your treatment, we will call you to review the results.  Follow-Up: At [facility], you and your health needs are our priority.  As part of our continuing mission to provide you with exceptional heart care, we have created designated Provider Care Teams.  These Care Teams include your primary Cardiologist (physician) and Advanced Practice Providers (APPs -  Physician Assistants and Nurse Practitioners) who all work together to provide you with the care you need, when you need it.  Your next appointment:   3 month(s)  Provider:   Thurmon Fair, MD  or Marjie Skiff, PA-C        Heart Failure Education: Weigh yourself EVERY morning after you go to the bathroom but before you eat or drink anything. Write this number down in a weight log/diary. If you gain 3 pounds overnight or 5 pounds in a week, call the office. Take your medicines as prescribed. If you have concerns about your medications, please call us before you stop taking them.  Eat low salt foods--Limit salt (sodium) to 2000 mg per day. This will help prevent your body from holding onto fluid. Read food labels as many processed foods have a lot of sodium, especially canned goods and prepackaged meats. If you would like some assistance choosing low sodium foods, we would be happy to set you up with a nutritionist. Limit all fluids for the day to less than 2 liters (64 ounces). Fluid includes all drinks, coffee,  juice, ice chips, soup, jello, and all other liquids. Stay as active as you can everyday. Staying active will give you more energy and make your muscles stronger. Start with 5 minutes at a time and work your way up to 30 minutes a day. Break up your activities--do some in the morning and some in the afternoon. Start with 3 days per week and work your way up to 5 days as you can.  If you have chest pain, feel short of breath, dizzy, or lightheaded, STOP. If you don't feel better after a short rest, call 911. If you do feel better, call the office to let us know you have symptoms with exercise.

## 2023-01-20 LAB — BASIC METABOLIC PANEL
BUN/Creatinine Ratio: 11 (ref 10–24)
BUN: 9 mg/dL (ref 8–27)
CO2: 23 mmol/L (ref 20–29)
Calcium: 8.8 mg/dL (ref 8.6–10.2)
Chloride: 102 mmol/L (ref 96–106)
Creatinine, Ser: 0.83 mg/dL (ref 0.76–1.27)
Glucose: 114 mg/dL — ABNORMAL HIGH (ref 70–99)
Potassium: 3.5 mmol/L (ref 3.5–5.2)
Sodium: 140 mmol/L (ref 134–144)
eGFR: 86 mL/min/{1.73_m2} (ref >59)

## 2023-01-20 LAB — MAGNESIUM: Magnesium: 2.1 mg/dL (ref 1.6–2.3)

## 2023-01-20 LAB — TSH: TSH: 2.23 u[IU]/mL (ref 0.450–4.500)

## 2023-01-20 LAB — BRAIN NATRIURETIC PEPTIDE: BNP: 65 pg/mL (ref 0.0–100.0)

## 2023-01-22 ENCOUNTER — Telehealth: Payer: Self-pay

## 2023-01-22 ENCOUNTER — Other Ambulatory Visit: Payer: Self-pay

## 2023-01-22 MED ORDER — POTASSIUM CHLORIDE ER 10 MEQ PO TBCR: 10.0000 meq | EXTENDED_RELEASE_TABLET | Freq: Every day | ORAL | 1 refills | Status: DC

## 2023-01-22 NOTE — Telephone Encounter (Signed)
Pt is going to call [employer] tech support to get help with his monitor.

## 2023-01-27 ENCOUNTER — Ambulatory Visit: Payer: Medicare Other | Admitting: Cardiovascular Disease

## 2023-01-27 ENCOUNTER — Encounter: Payer: Medicare Other | Admitting: Student

## 2023-02-01 NOTE — Progress Notes (Unsigned)
  Cardiology Office Note:   Date:  02/02/2023  ID:  Mavrik, Bynum [birth year], MRN [id number]  Primary Cardiologist: Thurmon Fair, MD Electrophysiologist: Lewayne Bunting, MD   History of Present Illness:   Steven Guzman is a 82 y.o. male with h/o CAD, Chronic systolic CHF, VT, PAF, HTN, HLD, COPD and GERD seen today for post hospital follow up.    Pt underwent Barostim implantation for chronic systolic CHF on 12/17/2022  Since discharge from hospital the patient reports doing well overall. He has already started to have improvement in his SOB. At baseline, had a hard time walking from his apartment to his dumpster (About 19 parking spaces) which is now improved. Notes SOB 1-2 times a week, where before was every day.  Did have small amount of blood this am from medial portion of generator pocket after showering. Exam consistent with retained sutures at each corner.   Device History: Barostim (standard) implanted 12/17/2022 for Chronic systolic CHF [employer] dual chamber ICD implanted 08/2009, gen change 03/2022 for CHF  Review of systems complete and found to be negative unless listed in HPI.    EP Information / Studies Reviewed:    EKG is not ordered today. EKG from 01/19/2023 reviewed which showed NSR at 80 bpm with PACs/PVCs  Barostim Interrogation- Performed personally and reviewed in detail today,  See scanned report  ICD/PPM interrogation - Not performed today. See last PaceArt report   Echo 06/2022 LVEF 30%, Grade 1 DD, normal RV, Mild LAE and RAE, trivial MR, trivial AI  Physical Exam:   VS:  BP 110/66   Pulse 84   Ht 5\' 9"  (1.753 m)   Wt 169 lb (76.7 kg)   SpO2 93%   BMI 24.96 kg/m    Wt Readings from Last 3 Encounters:  02/02/23 169 lb (76.7 kg)  01/19/23 169 lb (76.7 kg)  12/17/22 177 lb 4 oz (80.4 kg)     GEN: Well nourished, well developed in no acute distress NECK: No JVD; No carotid bruits CARDIAC: Regular rate and rhythm with occasional  ectopy, no murmurs, rubs, gallops RESPIRATORY:  Clear to auscultation without rales, wheezing or rhonchi  ABDOMEN: Soft, non-tender, non-distended EXTREMITIES:  No edema; No deformity   ASSESSMENT AND PLAN:    Chronic systolic CHF s/p Environmental manager and Barostim implantation NYHA II-III symptoms.   Device titrated from 1.0 millamp to 5.0 milliamp by increments of 0.4 with good blood pressure response and no stimulation.  Tested up to 6.0 mA without stim. Pt had slight HA during testing that resolved Device impedence stable. Pt goals are to increase functional capacity and be more active.  Normal device function See scanned report. Will follow up in 2 weeks to continue titration with goal of 6-8 milliamps for chronic settings.   Retained Suture Mild area of irritation on both sides of device suture.  Will give Keflex 500 mg BID x 7 days. Dr. Elberta Fortis reviewed site and agrees.   PAF AT Low burden by last remote Continue eliquis 5 mg BID for CHA2DS2/VASc of at least 5 Continue amidoarone 200 mg daily   VT NSVT episodes by device, longest 1 minute on last remote (though some appear 1:1 atrial drive tachycardia) Continue amiodarone as above.  Continue mexitil 200 mg BID  CAD Denies s/s ischemia  Disposition:   Follow up with EP APP in in 2 weeks  Signed, Graciella Freer, PA-C

## 2023-02-02 ENCOUNTER — Encounter: Payer: Self-pay | Admitting: Student

## 2023-02-02 ENCOUNTER — Ambulatory Visit: Payer: Medicare Other | Attending: Student | Admitting: Student

## 2023-02-02 VITALS — BP 110/66 | HR 84 | Ht 69.0 in | Wt 169.0 lb

## 2023-02-02 DIAGNOSIS — I2510 Atherosclerotic heart disease of native coronary artery without angina pectoris: Secondary | ICD-10-CM | POA: Diagnosis not present

## 2023-02-02 DIAGNOSIS — I5022 Chronic systolic (congestive) heart failure: Secondary | ICD-10-CM

## 2023-02-02 DIAGNOSIS — I255 Ischemic cardiomyopathy: Secondary | ICD-10-CM

## 2023-02-02 DIAGNOSIS — I472 Ventricular tachycardia, unspecified: Secondary | ICD-10-CM

## 2023-02-02 DIAGNOSIS — I480 Paroxysmal atrial fibrillation: Secondary | ICD-10-CM

## 2023-02-02 MED ORDER — CEPHALEXIN 500 MG PO CAPS: 500.0000 mg | ORAL_CAPSULE | Freq: Two times a day (BID) | ORAL | 0 refills | Status: AC

## 2023-02-02 NOTE — Patient Instructions (Addendum)
Medication Instructions:  Start Keflex 500 mg twice daily for one week. *If you need a refill on your cardiac medications before your next appointment, please call your pharmacy*  Lab Work: None ordered If you have labs (blood work) drawn today and your tests are completely normal, you will receive your results only by: MyChart Message (if you have MyChart) OR A paper copy in the mail If you have any lab test that is abnormal or we need to change your treatment, we will call you to review the results.  Follow-Up: At [facility], you and your health needs are our priority.  As part of our continuing mission to provide you with exceptional heart care, we have created designated Provider Care Teams.  These Care Teams include your primary Cardiologist (physician) and Advanced Practice Providers (APPs -  Physician Assistants and Nurse Practitioners) who all work together to provide you with the care you need, when you need it.  Your next appointment:   As scheduled

## 2023-02-15 NOTE — Progress Notes (Unsigned)
Cardiology Office Note:   Date:  02/16/2023  ID:  Esias, Mctier 1940-08-06, MRN [id number]  Primary Cardiologist: Thurmon Fair, MD Electrophysiologist: Lewayne Bunting, MD   History of Present Illness:   Steven Guzman is a 82 y.o. male with h/o CAD, Chronic systolic CHF, VT, PAF, HTN, HLD, COPD and GERD seen today for post hospital follow up.    Pt underwent Barostim implantation for chronic systolic CHF on 12/17/2022  At last visit, device titrated from 1.0 millamp to 5.0 milliamp by increments of 0.4 with good blood pressure response and no stimulation.  Tested up to 6.0 mA without stim. Pt had slight HA during testing that resolved  Has had some SOB and cough today and yesterday, but otherwise has been doing well. Continues to have an overall improvement in his functional status. Did not finish his Keflex correctly, and still has a couple of tabs to take.   At baseline, had a hard time walking from his apartment to his dumpster (About 19 parking spaces) which is now improved. Notes SOB 1-2 times a week, where before was every day.  Device History: Barostim (standard) implanted 12/17/2022 for Chronic systolic CHF [employer] dual chamber ICD implanted 08/2009, gen change 03/2022 for CHF  Review of systems complete and found to be negative unless listed in HPI.    EP Information / Studies Reviewed:    EKG is not ordered today. EKG from 01/19/2023 reviewed which showed NSR at 80 bpm with PACs/PVCs  Barostim Interrogation- Performed personally and reviewed in detail today,  See scanned report  ICD/PPM interrogation - Not performed today. See last [employer] report   Echo 06/2022 LVEF 30%, Grade 1 DD, normal RV, Mild LAE and RAE, trivial MR, trivial AI  Physical Exam:   VS:  BP (!) 92/50   Pulse (!) 58   Ht 5\' 9"  (1.753 m)   Wt 172 lb 12.8 oz (78.4 kg)   SpO2 92%   BMI 25.52 kg/m    Wt Readings from Last 3 Encounters:  02/16/23 172 lb 12.8 oz (78.4 kg)  02/02/23 169  lb (76.7 kg)  01/19/23 169 lb (76.7 kg)    GEN: Well nourished, well developed in no acute distress NECK: No JVD; No carotid bruits CARDIAC: Regular rate and rhythm, no murmurs, rubs, gallops RESPIRATORY:  Clear to auscultation without rales, wheezing or rhonchi  ABDOMEN: Soft, non-tender, non-distended EXTREMITIES:  No edema; No deformity    ASSESSMENT AND PLAN:    Chronic systolic CHF s/p Environmental manager and Barostim implantation NYHA II-III symptoms.   Device titrated from 5.0 millamp to 8.0 milliamp by increments of 0.4 with good blood pressure response and no stimulation. Pt had no stimulation all the way up to 9.0 Device impedence stable. Pt goals are to increase functional capacity and be more active.  Normal device function See scanned report. Will follow up in 2 weeks to continue titration with goal of 6-8 milliamps for chronic settings.   Retained Suture Mild area of irritation on both sides of device suture.  He still has a few tabs of Keflex 500 mg left somehow. Stressed importance of finishing courses of ABX.   PAF AT Low burden by last remote Continue eliquis 5 mg BID for CHA2DS2/VASc of at least 5 Continue amidoarone 200 mg daily   VT Continue amiodarone as above.  Continue mexitil 200 mg BID  CAD Denies s/s ischemia  Disposition:   Follow up with EP APP in 3 months.  Signed, Graciella Freer, PA-C

## 2023-02-16 ENCOUNTER — Encounter: Payer: Self-pay | Admitting: Student

## 2023-02-16 ENCOUNTER — Ambulatory Visit: Payer: Medicare Other | Attending: Student | Admitting: Student

## 2023-02-16 VITALS — BP 92/50 | HR 58 | Ht 69.0 in | Wt 172.8 lb

## 2023-02-16 DIAGNOSIS — I2510 Atherosclerotic heart disease of native coronary artery without angina pectoris: Secondary | ICD-10-CM

## 2023-02-16 DIAGNOSIS — I255 Ischemic cardiomyopathy: Secondary | ICD-10-CM

## 2023-02-16 DIAGNOSIS — I472 Ventricular tachycardia, unspecified: Secondary | ICD-10-CM | POA: Diagnosis not present

## 2023-02-16 DIAGNOSIS — I5022 Chronic systolic (congestive) heart failure: Secondary | ICD-10-CM

## 2023-02-16 NOTE — Patient Instructions (Signed)
Medication Instructions:  Your physician recommends that you continue on your current medications as directed. Please refer to the Current Medication list given to you today.  *If you need a refill on your cardiac medications before your next appointment, please call your pharmacy*  Lab Work: None ordered If you have labs (blood work) drawn today and your tests are completely normal, you will receive your results only by: MyChart Message (if you have MyChart) OR A paper copy in the mail If you have any lab test that is abnormal or we need to change your treatment, we will call you to review the results.  Follow-Up: At [facility], you and your health needs are our priority.  As part of our continuing mission to provide you with exceptional heart care, we have created designated Provider Care Teams.  These Care Teams include your primary Cardiologist (physician) and Advanced Practice Providers (APPs -  Physician Assistants and Nurse Practitioners) who all work together to provide you with the care you need, when you need it.  Your next appointment:   3 month(s)  Provider:   Michael "Andy" Tillery, PA-C  

## 2023-03-08 DIAGNOSIS — H903 Sensorineural hearing loss, bilateral: Secondary | ICD-10-CM | POA: Insufficient documentation

## 2023-03-26 ENCOUNTER — Other Ambulatory Visit: Payer: Self-pay | Admitting: Physician Assistant

## 2023-03-26 MED ORDER — SACUBITRIL-VALSARTAN 24-26 MG PO TABS: 1.0000 | ORAL_TABLET | Freq: Two times a day (BID) | ORAL | 2 refills | Status: DC

## 2023-03-26 NOTE — Addendum Note (Signed)
Addended by: Moishe Spice on: 03/26/2023 03:50 PM   Modules accepted: Orders

## 2023-04-12 ENCOUNTER — Emergency Department (HOSPITAL_COMMUNITY)
Admission: EM | Admit: 2023-04-12 | Discharge: 2023-04-12 | Disposition: A | Discharge status: Home / Self Care | Payer: Medicare Other | Attending: Emergency Medicine | Admitting: Emergency Medicine

## 2023-04-12 ENCOUNTER — Emergency Department (HOSPITAL_COMMUNITY): Payer: Medicare Other

## 2023-04-12 ENCOUNTER — Ambulatory Visit (INDEPENDENT_AMBULATORY_CARE_PROVIDER_SITE_OTHER): Payer: Medicare Other

## 2023-04-12 DIAGNOSIS — I472 Ventricular tachycardia, unspecified: Secondary | ICD-10-CM

## 2023-04-12 DIAGNOSIS — I2510 Atherosclerotic heart disease of native coronary artery without angina pectoris: Secondary | ICD-10-CM | POA: Insufficient documentation

## 2023-04-12 DIAGNOSIS — J449 Chronic obstructive pulmonary disease, unspecified: Secondary | ICD-10-CM | POA: Insufficient documentation

## 2023-04-12 DIAGNOSIS — I10 Essential (primary) hypertension: Secondary | ICD-10-CM | POA: Insufficient documentation

## 2023-04-12 DIAGNOSIS — R0789 Other chest pain: Secondary | ICD-10-CM

## 2023-04-12 DIAGNOSIS — Z4502 Encounter for adjustment and management of automatic implantable cardiac defibrillator: Secondary | ICD-10-CM | POA: Diagnosis not present

## 2023-04-12 DIAGNOSIS — I255 Ischemic cardiomyopathy: Secondary | ICD-10-CM

## 2023-04-12 DIAGNOSIS — Z7951 Long term (current) use of inhaled steroids: Secondary | ICD-10-CM | POA: Diagnosis not present

## 2023-04-12 DIAGNOSIS — I509 Heart failure, unspecified: Secondary | ICD-10-CM | POA: Insufficient documentation

## 2023-04-12 LAB — BASIC METABOLIC PANEL
Anion gap: 8 (ref 5–15)
BUN: 8 mg/dL (ref 8–23)
CO2: 25 mmol/L (ref 22–32)
Calcium: 8.8 mg/dL — ABNORMAL LOW (ref 8.9–10.3)
Chloride: 102 mmol/L (ref 98–111)
Creatinine, Ser: 0.75 mg/dL (ref 0.61–1.24)
GFR, Estimated: >60 mL/min (ref >60)
Glucose, Bld: 99 mg/dL (ref 70–99)
Potassium: 3.8 mmol/L (ref 3.5–5.1)
Sodium: 135 mmol/L (ref 135–145)

## 2023-04-12 LAB — CBC
HCT: 39.6 % (ref 39.0–52.0)
Hemoglobin: 11.8 g/dL — ABNORMAL LOW (ref 13.0–17.0)
MCH: 29 pg (ref 26.0–34.0)
MCHC: 29.8 g/dL — ABNORMAL LOW (ref 30.0–36.0)
MCV: 97.3 fL (ref 80.0–100.0)
Platelets: 139 10*3/uL — ABNORMAL LOW (ref 150–400)
RBC: 4.07 MIL/uL — ABNORMAL LOW (ref 4.22–5.81)
RDW: 19.1 % — ABNORMAL HIGH (ref 11.5–15.5)
WBC: 3.3 10*3/uL — ABNORMAL LOW (ref 4.0–10.5)
nRBC: 0 % (ref 0.0–0.2)

## 2023-04-12 LAB — TROPONIN I (HIGH SENSITIVITY)
Troponin I (High Sensitivity): 11 ng/L (ref <18)
Troponin I (High Sensitivity): 10 ng/L (ref <18)

## 2023-04-12 MED ORDER — KETOROLAC TROMETHAMINE 15 MG/ML IJ SOLN: 15.0000 mg | Freq: Once | INTRAMUSCULAR | Status: DC

## 2023-04-12 NOTE — ED Triage Notes (Signed)
 Pt BIB EMS from home for CP on the left arm pit. EMS was unable to get EKG. HX x3 MI and COPD. Pt took 2 nitroglycerin  with improvement at home and EMS gave 324 ASA with some improvement. Pt reports radiating pain to back and left shoulder while in the ambulance that has since resolved. Pt only reports back pain at this time. Denies SOB.  Pt presetns with Defib left side of chest and Barostim on right side of chest that is connected to carotid artery to improve blood flow.   EMS VS 110/70 65 HR 93% RA

## 2023-04-12 NOTE — Discharge Instructions (Signed)
 You are seen in the emergency room for chest pain.  Your cardiac workup is reassuring.  Of note, we incidentally found that your ICD has lifted.  It is functioning appropriately.  It is possible that it could be attributing to your pain, but maybe not since you are currently pain-free.  Cardiology team will follow-up with you again on 1-22 as an outpatient.  Please return to the ER if you have worsening chest pain, shortness of breath, pain radiating to your jaw, shoulder, or back, sweats or fainting. Otherwise see the Cardiologist or your primary care doctor as requested.

## 2023-04-12 NOTE — ED Notes (Addendum)
 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

## 2023-04-12 NOTE — ED Notes (Signed)
 This RN reviewed discharge instructions with patient. He verbalized understanding and denied any further questions. Pt well appearing upon transfer to hallway RN. Pt's ride is on her way.

## 2023-04-12 NOTE — ED Provider Notes (Addendum)
  EMERGENCY DEPARTMENT AT [facility] Provider Note   CSN: [id number] Arrival date & time: 04/12/23  1016     History  Chief Complaint  Patient presents with   Chest Pain    Steven Guzman is a 83 y.o. male.  HPI     83 y.o. male with h/o CAD, Chronic systolic CHF, VT, PAF, HTN, HLD, COPD and GERD comes in with chief complaint of chest pain.  Patient states that about 30 minutes prior to ED arrival he started having chest pain on the left side.  Chest pain initially sharp, then became dull.  The chest pain also subsequently has migrated to the scapular region.  Pain was nonradiating.  He denies any associated shortness of breath or diaphoresis but did have little bit of nausea.  Patient denies any similar pain in the recent past.  He is coughing, but states that the cough is not new for him.  He denies any pain with coughing or new phlegm production.  Patient did take nitroglycerin  at home without any relief.  He denies any GERD like symptoms or any strenuous activity that might have triggered the pain.  Home Medications Prior to Admission medications   Medication Sig Start Date End Date Taking? Authorizing Provider  acetaminophen  (TYLENOL ) 500 MG tablet Take 1,000 mg by mouth every 6 (six) hours as needed for moderate pain.    [provider]  albuterol  (PROVENTIL ) (2.5 MG/3ML) 0.083% nebulizer solution USE 1 VIAL VIA NEBULIZER EVERY 6 HOURS AS NEEDED FOR WHEEZING OR SHORTNESS OF BREATH 07/18/19   Olalere, Adewale A, MD  amiodarone  (PACERONE ) 200 MG tablet TAKE ONE TABLET BY MOUTH DAILY 04/08/22   Croitoru, Mihai, MD  apixaban  (ELIQUIS ) 5 MG TABS tablet Take 1 tablet (5 mg total) by mouth 2 (two) times daily. 12/18/22   Bethanie Cough, PA-C  Artificial Tear Solution (SOOTHE XP OP) Place 2 drops into both eyes daily as needed (dry eyes).    [provider]  Ascorbic Acid (VITAMIN C) 1000 MG tablet Take 1,000 mg by mouth daily.    [provider]  aspirin  EC 81 MG tablet Take 1 tablet (81 mg total) by mouth daily. Swallow whole. 03/21/22   Meng, Hao, PA  Biotin 5000 MCG CAPS Take 5,000 mcg by mouth daily.    [provider]  busPIRone  (BUSPAR ) 15 MG tablet Take 15 mg by mouth 2 (two) times daily.    [provider]  Calcium  Citrate-Vitamin D  (CALCIUM  + D PO) Take 1 tablet by mouth daily.    [provider]  carvedilol  (COREG ) 3.125 MG tablet Take 1 tablet (3.125 mg total) by mouth 2 (two) times daily. 03/11/22   Claudene Victory ORN, MD  cetirizine (ZYRTEC) 10 MG tablet Take 10 mg by mouth daily.    [provider]  Cyanocobalamin  (B12 LIQUID HEALTH BOOSTER PO) Take 5,000 mcg by mouth daily.    [provider]  Docusate Sodium  (DSS) 100 MG CAPS Take 2 capsules by mouth daily in the afternoon. 02/10/22   [provider]  empagliflozin  (JARDIANCE ) 10 MG TABS tablet Take 1 tablet (10 mg total) by mouth daily. Patient taking differently: Take 10 mg by mouth daily. 03/21/22   Meng, Hao, PA  escitalopram  (LEXAPRO ) 10 MG tablet Take 20 mg by mouth daily. 07/24/22 07/24/23  [provider]  fluticasone  (FLONASE ) 50 MCG/ACT nasal spray Place 2 sprays into both nostrils daily. 02/13/21   Gil Greig BRAVO, NP  guaiFENesin  (MUCINEX ) 600 MG 12 hr tablet Take 1 tablet by mouth 2 (two) times daily.    [provider]  Ipratropium-Albuterol  (COMBIVENT  RESPIMAT) 20-100 MCG/ACT AERS respimat Inhale 1 puff into the lungs every 6 (six) hours. Shortness of breath or wheezing Patient taking differently: Inhale 1 puff into the lungs every 6 (six) hours as needed for shortness of breath or wheezing. 09/18/21   Fargo, Amy E, NP  IRON PO Take 1 tablet by mouth daily.    [provider]  Magnesium  200 MG TABS Take 400 mg by mouth daily.    [provider]  melatonin 5 MG TABS Take 10 mg by mouth at bedtime.    [provider]  mexiletine (MEXITIL ) 200 MG capsule Take  1 capsule (200 mg total) by mouth 2 (two) times daily. 12/02/22   Croitoru, Mihai, MD  Multiple Vitamins-Minerals (CENTRUM ADULTS PO) Take 1 tablet by mouth daily.    [provider]  mupirocin  ointment (BACTROBAN ) 2 % Apply 1 Application topically daily as needed (wound care). 11/04/22   [provider]  nitroGLYCERIN  (NITROSTAT ) 0.4 MG SL tablet Place 1 tablet (0.4 mg total) under the tongue every 5 (five) minutes as needed for chest pain. DISSOLVE 1 TABLET UNDER THE TONGUE EVERY 5 MINUTES FOR 3 DOSES 11/02/22   Emelia Josefa HERO, NP  omeprazole (PRILOSEC) 20 MG capsule Take 20 mg by mouth daily.     [provider]  polyethylene glycol powder (GLYCOLAX /MIRALAX ) 17 GM/SCOOP powder Take 1 capful in 8 ounces of fluid each morning Patient taking differently: Take 17 g by mouth daily as needed for moderate constipation. 03/08/22   Wehner, Abigal, MD  potassium chloride  (KLOR-CON ) 10 MEQ tablet Take 1 tablet (10 mEq total) by mouth daily. Take as needed only when you take your lasix  01/22/23 04/22/23  Goodrich, Callie E, PA-C  Respiratory Therapy Supplies (FLUTTER) DEVI Use as directed Patient taking differently: 1 each by Other route See admin instructions. Use as directed 03/16/17   McQuaid, Douglas B, MD  rosuvastatin  (CRESTOR ) 40 MG tablet TAKE ONE TABLET BY MOUTH DAILY Patient not taking: Reported on 02/16/2023 09/17/22   Waddell Danelle ORN, MD  sacubitril -valsartan  (ENTRESTO ) 24-26 MG Take 1 tablet by mouth 2 (two) times daily. 03/26/23   Meng, Hao, PA  Spacer/Aero-Holding Chambers (OPTICHAMBER DIAMOND ) MISC optichamber [facility] Patient taking differently: 1 each by Other route See admin instructions. optichamber [facility] 09/12/19   Olalere, Adewale A, MD  spironolactone  (ALDACTONE ) 25 MG tablet Take 0.5 tablets (12.5 mg total) by mouth daily. 11/02/22   Emelia Josefa HERO, NP  STIOLTO RESPIMAT 2.5-2.5 MCG/ACT AERS Inhale 2 puffs into the lungs daily. 09/22/21   [provider]   tamsulosin  (FLOMAX ) 0.4 MG CAPS capsule TAKE ONE CAPSULE BY MOUTH DAILY AFTER SUPPER 06/30/21   Gil No E, NP      Allergies    Sulfa antibiotics    Review of Systems   Review of Systems  All other systems reviewed and are negative.   Physical Exam Updated Vital Signs BP 109/67 (BP Location: Left Arm)   Pulse (!) 57   Temp (!) 97.5 F (36.4 C) (Oral)   Resp 17   Ht 5' 9 (1.753 m)   Wt 78.4 kg   SpO2 98%   BMI 25.52 kg/m  Physical Exam Vitals and nursing note reviewed.  Constitutional:      Appearance: He is well-developed.  HENT:     Head: Atraumatic.  Cardiovascular:  Rate and Rhythm: Normal rate.  Pulmonary:     Effort: Pulmonary effort is normal.  Musculoskeletal:     Cervical back: Neck supple.     Right lower leg: No edema.     Left lower leg: No edema.  Skin:    General: Skin is warm.  Neurological:     Mental Status: He is alert and oriented to person, place, and time.     ED Results / Procedures / Treatments   Labs (all labs ordered are listed, but only abnormal results are displayed) Labs Reviewed  BASIC METABOLIC PANEL - Abnormal; Notable for the following components:      Result Value   Calcium  8.8 (*)    All other components within normal limits  CBC - Abnormal; Notable for the following components:   WBC 3.3 (*)    RBC 4.07 (*)    Hemoglobin 11.8 (*)    MCHC 29.8 (*)    RDW 19.1 (*)    Platelets 139 (*)    All other components within normal limits  TROPONIN I (HIGH SENSITIVITY)  TROPONIN I (HIGH SENSITIVITY)    EKG EKG Interpretation Date/Time:  Monday April 12 2023 10:25:07 EST Ventricular Rate:  54 PR Interval:  171 QRS Duration:  186 QT Interval:  502 QTC Calculation: 476 R Axis:   62  Text Interpretation: Sinus rhythm LAE, consider biatrial enlargement Left bundle branch block Confirmed by Charlyn Sora ([id number]) on 04/12/2023 10:56:33 AM  Radiology DG Chest Port 1 View Result Date: 04/12/2023 CLINICAL DATA:  Chest  pain EXAM: PORTABLE CHEST 1 VIEW COMPARISON:  03/18/2022 FINDINGS: Nerve stimulator pack overlies the upper right chest. Left subclavian approach ICD pack has rotated nearly 180 degrees. The leads appear unchanged in positioning. Stable heart size. No focal airspace consolidation, pleural effusion, or pneumothorax. IMPRESSION: 1. No acute cardiopulmonary findings. 2. Left subclavian approach ICD pack has rotated nearly 180 degrees compared to positioning on the prior study. The leads appear unchanged in positioning. Electronically Signed   By: [name] Converse D.O.   On: 04/12/2023 12:02    Procedures Procedures    Medications Ordered in ED Medications  ketorolac  (TORADOL ) 15 MG/ML injection 15 mg (15 mg Intravenous Not Given 04/12/23 1300)    ED Course/ Medical Decision Making/ A&P Clinical Course as of 04/12/23 2156  Mon Apr 12, 2023  1057 Cath: 02-2023. Conclusion - medically manage    Mid LM to Dist LM lesion is 20% stenosed.   Ost LAD to Prox LAD lesion is 25% stenosed.   Prox LAD to Mid LAD lesion is 35% stenosed.   3rd Mrg lesion is 100% stenosed.   Prox RCA to Mid RCA lesion is 40% stenosed.   RPDA lesion is 80% stenosed.   There is mild left ventricular systolic dysfunction.   LV end diastolic pressure is mildly elevated.   The left ventricular ejection fraction is 45-50% by visual estimate.   1. 2 vessel obstructive CAD - 100% occlusion of distal OM3 appears to be the culprit. The ostial PDA lesion is unchanged from 2014 2. Patent stents in the LAD and RCA 3. Mild LV dysfunction with inferior wall motion abnormality 4. Mildly elevated LVEDP 16 mm Hg  [AN]  1331 Patient's cardia workup is reassuring, but his chest x-ray, which was independently interpreted does reveal that the left-sided ICD has rotated 180 degrees. Given the atypical nature to the pain that does have some visceral component to it, I have spoken with cardiology to  assess the patient and to tell us  if  the device itself is at risk for failing. [AN]  1446 Cardiology service has seen the patient and cleared him.  Patient made aware that if his second troponin is reassuring he can be discharged.  [employer] has cleared the ICD, it is functioning well. [AN]    Clinical Course User Index [AN] Charlyn Sora, MD                                 Medical Decision Making Amount and/or Complexity of Data Reviewed Labs: ordered. Radiology: ordered.  Risk Prescription drug management.   This patient presents to the ED with chief complaint(s) of chest pain with pertinent past medical history of CAD status post PCI, COPD, CHF and VT status post AICD placement.The complaint involves an extensive differential diagnosis and also carries with it a high risk of complications and morbidity.    The differential diagnosis considered for this patient includes  ACS syndrome Aortic dissection CHF exacerbation Valvular disorder Myocarditis Pericarditis Endocarditis Pericardial effusion / tamponade Pneumonia Pleural effusion / Pulmonary edema PE Pneumothorax Musculoskeletal pain PUD / Gastritis / Esophagitis Esophageal spasm  The initial plan is to get basic labs including troponins. We will also interrogate his device, although it does not appear that there was shocked discharge. Patient had a cardiac cath done about 13 months ago which was reassuring.  Additional history obtained: Additional history obtained from EMS  Records reviewed  previous cardiology notes, cath report from 02-2022 which was reassuring  Independent labs interpretation:  The following labs were independently interpreted: normal troponin, cbc  Independent visualization and interpretation of imaging: - I independently visualized the following imaging with scope of interpretation limited to determining acute life threatening conditions related to emergency care: xray chest, which revealed rotation of the  ICD.  Treatment and Reassessment: Pt chest pain free after toradol .   Consultation: - Consulted or discussed management/test interpretation with external professional: Cardiology - in regards to the ICD migration. The interrogation of the device is normal, thus they recommend d/c with close follow up.     Final Clinical Impression(s) / ED Diagnoses Final diagnoses:  Chest wall pain  Encounter for assessment of automatic implantable cardioverter-defibrillator (AICD)    Rx / DC Orders ED Discharge Orders     None           Charlyn Sora, MD 04/12/23 2156

## 2023-04-14 LAB — CUP PACEART REMOTE DEVICE CHECK
Battery Remaining Longevity: 156 mo
Battery Remaining Percentage: 100 %
Brady Statistic RA Percent Paced: 3 %
Brady Statistic RV Percent Paced: 3 %
Date Time Interrogation Session: 20250113143600
HighPow Impedance: 50 Ohm
Implantable Lead Connection Status: 753985
Implantable Lead Connection Status: 753985
Implantable Lead Implant Date: 20110610
Implantable Lead Implant Date: 20110610
Implantable Lead Location: 753859
Implantable Lead Location: 753860
Implantable Lead Model: 185
Implantable Lead Model: 4135
Implantable Lead Serial Number: 28681386
Implantable Lead Serial Number: 339643
Implantable Pulse Generator Implant Date: 20240122
Lead Channel Impedance Value: 404 Ohm
Lead Channel Impedance Value: 678 Ohm
Lead Channel Pacing Threshold Amplitude: 2.8 V
Lead Channel Pacing Threshold Pulse Width: 0.4 ms
Lead Channel Setting Pacing Amplitude: 2 V
Lead Channel Setting Pacing Amplitude: 2.4 V
Lead Channel Setting Pacing Pulse Width: 0.4 ms
Lead Channel Setting Sensing Sensitivity: 0.5 mV
Pulse Gen Serial Number: 238282

## 2023-04-16 ENCOUNTER — Other Ambulatory Visit: Payer: Self-pay | Admitting: Cardiology

## 2023-04-19 MED ORDER — EMPAGLIFLOZIN 10 MG PO TABS: 10.0000 mg | ORAL_TABLET | Freq: Every day | ORAL | 9 refills | Status: AC

## 2023-04-21 ENCOUNTER — Ambulatory Visit: Payer: Medicare Other | Attending: Physician Assistant | Admitting: Emergency Medicine

## 2023-04-21 ENCOUNTER — Encounter: Payer: Self-pay | Admitting: Physician Assistant

## 2023-04-21 VITALS — BP 104/80 | HR 61 | Ht 70.0 in | Wt 168.8 lb

## 2023-04-21 DIAGNOSIS — Z95810 Presence of automatic (implantable) cardiac defibrillator: Secondary | ICD-10-CM

## 2023-04-21 DIAGNOSIS — M791 Myalgia, unspecified site: Secondary | ICD-10-CM | POA: Diagnosis not present

## 2023-04-21 DIAGNOSIS — I2510 Atherosclerotic heart disease of native coronary artery without angina pectoris: Secondary | ICD-10-CM

## 2023-04-21 DIAGNOSIS — I5022 Chronic systolic (congestive) heart failure: Secondary | ICD-10-CM

## 2023-04-21 DIAGNOSIS — Z789 Other specified health status: Secondary | ICD-10-CM

## 2023-04-21 DIAGNOSIS — E785 Hyperlipidemia, unspecified: Secondary | ICD-10-CM

## 2023-04-21 DIAGNOSIS — I480 Paroxysmal atrial fibrillation: Secondary | ICD-10-CM

## 2023-04-21 DIAGNOSIS — I255 Ischemic cardiomyopathy: Secondary | ICD-10-CM

## 2023-04-21 DIAGNOSIS — Z720 Tobacco use: Secondary | ICD-10-CM

## 2023-04-21 MED ORDER — ROSUVASTATIN CALCIUM 20 MG PO TABS: 20.0000 mg | ORAL_TABLET | Freq: Every day | ORAL | 3 refills | Status: DC

## 2023-04-21 NOTE — Progress Notes (Unsigned)
Cardiology Office Note:    Date:  04/21/2023  ID:  Ezra, Boyan 02-14-1941, MRN [id number] PCP: Cleophus Molt  [facility] Providers Cardiologist:  Thurmon Fair, MD Electrophysiologist:  Lewayne Bunting, MD { Click to update primary MD,subspecialty MD or APP then REFRESH:1}    {Click to Open Review  :1}   Patient Profile:      Zarian Fennell is a 83 year old male with visit pertinent history of CAD s/p inferior MI complicated by cardiac arrest in 1995 s/p stenting to RCA, PCI with stenting to LAD in 2011, NSTEMI in 03/18/2022 due to occlusion of OM 3 treated medically, ischemic cardiomyopathy, chronic HFrEF with a EF of 30% s/p Barostim in 11/2022, VT s/p [employer] ICD in 2010 and ablation 2016 on amiodarone, PAF on Eliquis, AAA, hypertension, hyperlipidemia, COPD, GERD, hiatal hernia.  He does have a long history of CAD with prior interventions to RCA in 1995 and LAD in 2011.  Most recent ischemic evaluation on 02/2022 in the setting of NSTEMI which showed patent stents to RCA and LAD and 100% occlusion of distal OM 3 (culprit lesion) as well as severe 80% stenosis of ostial PDA which was unchanged from 2014.  Medical therapy was recommended at that time.  He is also has history of ischemic cardiomyopathy/chronic HFrEF/VT, he underwent placement of [employer] ICD in 2010 and had VT ablation 2016 with subsequent ICD change in January 2024.  Most recent echocardiogram in April 2024 showed LVEF of 30% with akinesis of basal inferoseptal, basal to mid inferior, and inferolateral walls and hypokinesis of anterolateral wall, LVH, grade 1 DD, mildly enlarged RV, mild biatrial enlargement, no significant valvular disease.  He does have chronic dyspnea on exertion and underwent placement of right sided Barostim in September 2024.  He was seen in clinic by [facility], [name] on 01/19/2023 where he was noted to have some lower extremity edema he was given a short course of  Lasix and recommended as needed use after.  His BNP was negative.  He was seen in the ED on 04/12/2023 with complaints of chest pain.  The chest pain was left-sided, initially sharp then became dull and subsequently migrated to the scapular region.  He took a nitroglycerin at home without any relief.  High-sensitivity troponins unremarkable at 10, 11.  Chest x-ray showed no acute cardiopulmonary findings      History of Present Illness:  Discussed the use of AI scribe software for clinical note transcription with the patient, who gave verbal consent to proceed.  LARRION RINGGER is a 83 y.o. male who returns for ED follow-up for chest pain.  The chest pain was described as between dull and sharp, and lasted for about an hour. The pain started while the patient was at rest in bed and did not resolve with x2 nitroglycerin.  The pain was nonexertional and without any radiation.  He notes left side of chest wall was tender to touch.  The patient has not experienced any further chest pain since the ED visit.  The pain did not feel like previous angina episodes.  The patient reported restarting smoking about two months ago after a period of abstinence of about three to four years. The patient is currently smoking about half a pack a day. The patient also reported shortness of breath, which has improved since his Barostim procedure.   The patient stopped taking rosuvastatin about six months ago due to perceived muscle aches. The patient reported feeling  better after stopping the medication. The patient is not currently exercising regularly but is considering starting water aerobics.   Today he is without any chest pain, orthopnea, syncope, near syncope, lightheadedness, dizziness, claudication, leg swelling.  There is no change in his chronic SOB or DOE.    Review of Systems  Constitutional: Negative for weight gain and weight loss.  Cardiovascular:  Positive for dyspnea on exertion. Negative for chest  pain, claudication, irregular heartbeat, leg swelling, near-syncope, orthopnea, palpitations, paroxysmal nocturnal dyspnea and syncope.  Respiratory:  Positive for shortness of breath. Negative for cough and hemoptysis.   Gastrointestinal:  Negative for abdominal pain, hematochezia and melena.  Genitourinary:  Negative for hematuria.  Neurological:  Negative for dizziness and light-headedness.     See HPI     Home Medications:    Prior to Admission medications   Medication Sig Start Date End Date Taking? Authorizing Provider  acetaminophen (TYLENOL) 500 MG tablet Take 1,000 mg by mouth every 6 (six) hours as needed for moderate pain.    [provider]  albuterol (PROVENTIL) (2.5 MG/3ML) 0.083% nebulizer solution USE 1 VIAL VIA NEBULIZER EVERY 6 HOURS AS NEEDED FOR WHEEZING OR SHORTNESS OF BREATH 07/18/19   Olalere, Adewale A, MD  amiodarone (PACERONE) 200 MG tablet TAKE ONE TABLET BY MOUTH DAILY 04/08/22   Croitoru, Rachelle Hora, MD  apixaban (ELIQUIS) 5 MG TABS tablet Take 1 tablet (5 mg total) by mouth 2 (two) times daily. 12/18/22   Emilie Rutter, PA-C  Artificial Tear Solution (SOOTHE XP OP) Place 2 drops into both eyes daily as needed (dry eyes).    [provider]  Ascorbic Acid (VITAMIN C) 1000 MG tablet Take 1,000 mg by mouth daily.    [provider]  aspirin EC 81 MG tablet Take 1 tablet (81 mg total) by mouth daily. Swallow whole. 03/21/22   Azalee Course, PA  Biotin 5000 MCG CAPS Take 5,000 mcg by mouth daily.    [provider]  busPIRone (BUSPAR) 15 MG tablet Take 15 mg by mouth 2 (two) times daily.    [provider]  Calcium Citrate-Vitamin D (CALCIUM + D PO) Take 1 tablet by mouth daily.    [provider]  carvedilol (COREG) 3.125 MG tablet Take 1 tablet (3.125 mg total) by mouth 2 (two) times daily. 03/11/22   Lyn Records, MD  cetirizine (ZYRTEC) 10 MG tablet Take 10 mg by mouth daily.    [provider]   Cyanocobalamin (B12 LIQUID HEALTH BOOSTER PO) Take 5,000 mcg by mouth daily.    [provider]  Docusate Sodium (DSS) 100 MG CAPS Take 2 capsules by mouth daily in the afternoon. 02/10/22   [provider]  empagliflozin (JARDIANCE) 10 MG TABS tablet Take 1 tablet (10 mg total) by mouth daily. 04/19/23   Croitoru, Mihai, MD  escitalopram (LEXAPRO) 10 MG tablet Take 20 mg by mouth daily. 07/24/22 07/24/23  [provider]  fluticasone (FLONASE) 50 MCG/ACT nasal spray Place 2 sprays into both nostrils daily. 02/13/21   Fargo, Amy E, NP  guaiFENesin (MUCINEX) 600 MG 12 hr tablet Take 1 tablet by mouth 2 (two) times daily.    [provider]  Ipratropium-Albuterol (COMBIVENT RESPIMAT) 20-100 MCG/ACT AERS respimat Inhale 1 puff into the lungs every 6 (six) hours. Shortness of breath or wheezing Patient taking differently: Inhale 1 puff into the lungs every 6 (six) hours as needed for shortness of breath or wheezing. 09/18/21   Octavia Heir,  NP  IRON PO Take 1 tablet by mouth daily.    [provider]  Magnesium 200 MG TABS Take 400 mg by mouth daily.    [provider]  melatonin 5 MG TABS Take 10 mg by mouth at bedtime.    [provider]  mexiletine (MEXITIL) 200 MG capsule Take 1 capsule (200 mg total) by mouth 2 (two) times daily. 12/02/22   Croitoru, Mihai, MD  Multiple Vitamins-Minerals (CENTRUM ADULTS PO) Take 1 tablet by mouth daily.    [provider]  mupirocin ointment (BACTROBAN) 2 % Apply 1 Application topically daily as needed (wound care). 11/04/22   [provider]  nitroGLYCERIN (NITROSTAT) 0.4 MG SL tablet Place 1 tablet (0.4 mg total) under the tongue every 5 (five) minutes as needed for chest pain. DISSOLVE 1 TABLET UNDER THE TONGUE EVERY 5 MINUTES FOR 3 DOSES 11/02/22   Ronney Asters, NP  omeprazole (PRILOSEC) 20 MG capsule Take 20 mg by mouth daily.     [provider]  polyethylene glycol powder  (GLYCOLAX/MIRALAX) 17 GM/SCOOP powder Take 1 capful in 8 ounces of fluid each morning Patient taking differently: Take 17 g by mouth daily as needed for moderate constipation. 03/08/22   Linward Foster, MD  potassium chloride (KLOR-CON) 10 MEQ tablet Take 1 tablet (10 mEq total) by mouth daily. Take as needed only when you take your lasix 01/22/23 04/22/23  Corrin Parker, PA-C  Respiratory Therapy Supplies (FLUTTER) DEVI Use as directed Patient taking differently: 1 each by Other route See admin instructions. Use as directed 03/16/17   [name], MD  rosuvastatin (CRESTOR) 40 MG tablet TAKE ONE TABLET BY MOUTH DAILY Patient not taking: Reported on 02/16/2023 09/17/22   Marinus Maw, MD  sacubitril-valsartan (ENTRESTO) 24-26 MG Take 1 tablet by mouth 2 (two) times daily. 03/26/23   Azalee Course, PA  Spacer/Aero-Holding Chambers (OPTICHAMBER DIAMOND) MISC optichamber [facility] Patient taking differently: 1 each by Other route See admin instructions. optichamber [facility] 09/12/19   Olalere, Adewale A, MD  spironolactone (ALDACTONE) 25 MG tablet Take 0.5 tablets (12.5 mg total) by mouth daily. 11/02/22   Ronney Asters, NP  STIOLTO RESPIMAT 2.5-2.5 MCG/ACT AERS Inhale 2 puffs into the lungs daily. 09/22/21   [provider]  tamsulosin (FLOMAX) 0.4 MG CAPS capsule TAKE ONE CAPSULE BY MOUTH DAILY AFTER SUPPER 06/30/21   Octavia Heir, NP   Studies Reviewed:       Carotid ultrasound duplex 11/23/2022 Right Carotid: Velocities in the right ICA are consistent with a 1-39%  stenosis.   Left Carotid: Velocities in the left ICA are consistent with a 1-39%  stenosis.   Vertebrals: Bilateral vertebral arteries demonstrate antegrade flow.  Subclavians: Normal flow hemodynamics were seen in the left subclavian  artery.  Right subclavian appears monophasic, limited visualization.   Echocardiogram 07/02/2022 1. Left ventricular ejection fraction, by estimation, is 30%. The left  ventricle has moderate  to severely decreased function. The left ventricle  demonstrates regional wall motion abnormalities with basal inferoseptal  akinesis, basal to mid inferior  akinesis, inferolateral akinesis, anterolateral hypokinesis. The left  ventricular internal cavity size was mildly to moderately dilated. There  is mild concentric left ventricular hypertrophy. Left ventricular  diastolic parameters are consistent with  Grade I diastolic dysfunction (impaired relaxation).   2. Right ventricular systolic function is normal. The right ventricular  size is mildly enlarged. Tricuspid regurgitation signal is inadequate for  assessing PA pressure.   3.  Left atrial size was mildly dilated.   4. Right atrial size was mildly dilated.   5. The mitral valve is normal in structure. Trivial mitral valve  regurgitation. No evidence of mitral stenosis.   6. The aortic valve is tricuspid. There is moderate calcification of the  aortic valve. Aortic valve regurgitation is trivial. No aortic stenosis is  present.   7. The inferior vena cava is normal in size with greater than 50%  respiratory variability, suggesting right atrial pressure of 3 mmHg.   Cardiac catheterization 03/19/2022 2 vessel obstructive CAD - 100% occlusion of distal OM3 appears to be the culprit. The ostial PDA lesion is unchanged from 2014 Patent stents in the LAD and RCA Mild LV dysfunction with inferior wall motion abnormality Mildly elevated LVEDP 16 mm Hg Diagnostic Dominance: Right  Risk Assessment/Calculations:    CHA2DS2-VASc Score = 5  {Confirm score is correct.  If not, click here to update score.  REFRESH note.  :1} This indicates a 7.2% annual risk of stroke. The patient's score is based upon: CHF History: 1 HTN History: 1 Diabetes History: 0 Stroke History: 0 Vascular Disease History: 1 Age Score: 2 Gender Score: 0   {This patient has a significant risk of stroke if diagnosed with atrial fibrillation.  Please consider  VKA or DOAC agent for anticoagulation if the bleeding risk is acceptable.   You can also use the SmartPhrase .[email or web address] for documentation.   :[id number]}         Physical Exam:   VS:  BP 104/80 (BP Location: Left Arm, Patient Position: Sitting, Cuff Size: Normal)   Pulse 61   Ht 5\' 10"  (1.778 m)   Wt 168 lb 12.8 oz (76.6 kg)   SpO2 93%   BMI 24.22 kg/m    Wt Readings from Last 3 Encounters:  04/21/23 168 lb 12.8 oz (76.6 kg)  04/12/23 172 lb 13.5 oz (78.4 kg)  02/16/23 172 lb 12.8 oz (78.4 kg)    Constitutional:      Appearance: Normal and healthy appearance. Not in distress.  Neck:     Vascular: JVD normal.  Pulmonary:     Effort: Pulmonary effort is normal.     Breath sounds: Normal breath sounds.  Chest:     Chest wall: Not tender to palpatation.  Cardiovascular:     PMI at left midclavicular line. Normal rate. Regular rhythm. Normal S1. Normal S2.      Murmurs: There is no murmur.     No gallop.  No click. No rub.  Pulses:    Intact distal pulses.  Edema:    Peripheral edema absent.  Musculoskeletal: Normal range of motion.     Cervical back: Normal range of motion and neck supple. Skin:    General: Skin is warm and dry.  Neurological:     General: No focal deficit present.     Mental Status: Alert, oriented to person, place, and time and oriented to person, place and time.  Psychiatric:        Mood and Affect: Mood and affect normal.        Behavior: Behavior is cooperative.        Thought Content: Thought content normal.        Assessment and Plan:  Coronary artery disease / Atypical chest pain  Prior interventions to RCA in 1995 and LAD in 2011.  Left cardiac catheterization in 02/2022 showed patent stents to RCA and LAD and 100% occlusion of distal OM3 and 80%  stenosis of ostial PDA. Medical therapy was recommended at that time.  Most recently x1 episode of CP starting at rest, lasting 1 hour, with no radiation, not relieved by x2 nitroglycerin, with tender  left-sided chest wall, and not similar to prior angina.  He did have normal troponins while in the ED. -Has been without any chest pain or exertional angina since ED visit 9 days ago. He denies any exertional angina  -Given episode of CP is atypical and musculoskeletal in nature will not pursue ischemic evaluation at this time -Continue ASA 81 mg daily, carvedilol 3.125 twice daily, rosuvastatin 20 mg daily  Chornic HFrEF / Ischemic cardiomyopathy S/p Barostim implatation 11/2022 Previous echocardiogram 06/2022 showed LVEF of 30% with akinesis of basal inferoseptal, basal to mid inferior, and inferolateral walls and hypokinesis of anterolateral wall -Euvolemic and well compensated on exam -He denies any leg swelling, orthopnea, PND and no change in chronic SOB/DOE.  He has not been using his furosemide as needed as he denies any edema -He notes his chronic dyspnea is improved with Barostim -GDMT limited by soft BP (104/80) -Continue carvedilol 3.125 mg twice daily, Jardiance 10 mg daily, Entresto 24/26 mg twice daily, spironolactone 12.5 mg daily -Encouraged daily weights, <2 L fluid restriction, salt restriction  VT s/p [employer] dual chamber ICD S/p ICD in 2010 with generator change 03/2022. Hx VT ablation in 2016 -Managed by EP, f/u appt 05/19/2023 -Continue amiodarone 200 mg daily, mexiletine 200 mg twice daily, carvedilol 3.125 mg twice daily  Paroxysmal artrial fibrillation  Quiescent. He is without palpitations, lightheadedness, syncope, tachycardia -Managed by EP -Continue amiodarone 200 mg daily, mexiletine 200 mg twice daily, carvedilol 3.25 mg twice daily -Continue Eliquis 5 mg twice daily  CHA2DS2-VASc Score = 5 [CHF History: 1, HTN History: 1, Diabetes History: 0, Stroke History: 0, Vascular Disease History: 1, Age Score: 2, Gender Score: 0].  Therefore, the patient's annual risk of stroke is 7.2 %.      Hypertension  Blood pressure today 104/80 and under excellent  control. -Continue current antihypertensive regimen as above  Hyperlipidemia  LDL 103 on 02/2022  -He stopped Crestor 40mg  approx x6 months ago due to myalgias. Previously intolerant to Atorvastatin given AST/ALT elevations.  -Start Crestor 20mg  daily. Notify office if myalgias reoccur on reduced dosage  -Refer to [facility] lipid clinic for consideration of PCSK9i therapy -Repeat lipid panel today   Tobacco Use Patient resumed smoking approximately two months ago after a period of abstinence of three to four years. Currently smoking half a pack per day. -Encouraged complete cessation            Dispo:  Return in about 3 months (around 07/20/2023).  Signed, Denyce Robert, NP

## 2023-04-21 NOTE — Patient Instructions (Signed)
Medication Instructions:  START ROSUVASTATIN 20 MG DAILY *If you need a refill on your cardiac medications before your next appointment, please call your pharmacy*   Lab Work: LIPID PANEL TODAY If you have labs (blood work) drawn today and your tests are completely normal, you will receive your results only by: MyChart Message (if you have MyChart) OR A paper copy in the mail If you have any lab test that is abnormal or we need to change your treatment, we will call you to review the results.   Testing/Procedures: NO TESTING   Follow-Up: At [facility], you and your health needs are our priority.  As part of our continuing mission to provide you with exceptional heart care, we have created designated Provider Care Teams.  These Care Teams include your primary Cardiologist (physician) and Advanced Practice Providers (APPs -  Physician Assistants and Nurse Practitioners) who all work together to provide you with the care you need, when you need it.  Your next appointment:   3 month(s)  Provider:   Rise Paganini, NP  Other Instructions You have been referred to Pharm-D lipid clinic.

## 2023-04-22 ENCOUNTER — Telehealth: Payer: Self-pay | Admitting: Pharmacist

## 2023-04-22 ENCOUNTER — Encounter: Payer: Self-pay | Admitting: Pharmacist

## 2023-04-22 ENCOUNTER — Ambulatory Visit: Payer: Medicare Other | Attending: Internal Medicine | Admitting: Pharmacist

## 2023-04-22 DIAGNOSIS — I2510 Atherosclerotic heart disease of native coronary artery without angina pectoris: Secondary | ICD-10-CM

## 2023-04-22 DIAGNOSIS — I214 Non-ST elevation (NSTEMI) myocardial infarction: Secondary | ICD-10-CM

## 2023-04-22 DIAGNOSIS — E785 Hyperlipidemia, unspecified: Secondary | ICD-10-CM

## 2023-04-22 LAB — LIPID PANEL
Chol/HDL Ratio: 3.9 {ratio} (ref 0.0–5.0)
Cholesterol, Total: 214 mg/dL — ABNORMAL HIGH (ref 100–199)
HDL: 55 mg/dL (ref >39)
LDL Chol Calc (NIH): 145 mg/dL — ABNORMAL HIGH (ref 0–99)
Triglycerides: 79 mg/dL (ref 0–149)
VLDL Cholesterol Cal: 14 mg/dL (ref 5–40)

## 2023-04-22 NOTE — Progress Notes (Signed)
Patient ID: Steven Guzman                 DOB: 05-17-1940                    MRN: [id number]     HPI: Steven Guzman is a 83 y.o. male patient referred to lipid clinic by [facility]. PMH is significant for ventricular tachycardia, NSTEMI, systolic HF, CAD, A Fib, AAA, COPD, and smoking.  Patient presents today to discuss lipid management. Had been on atorvastatin for years but discontinued last July. Restarted on rosuvastatin 20mg  yesterday.  Has not been able to get exercise due to COPD although he is hopeful he and his wife can begin water aerobics soon. Wife is diabetic and prepares most of the food however he believes she makes food that is not heart healthy or diabetic friendly.  He eats meat and sandwiches.   Continues to smoke 1/2 ppd. Drinks alcohol seldomly.  Current Medications:  Rosuvastatin 20mg   Risk Factors:  CAD CHF Smoking Hx of NSTEMI  LDL goal: <55  Labs: TC 214, Trigs 79, HDL 55, LDL 145 (04/21/23)  Past Medical History:  Diagnosis Date   AICD (automatic cardioverter/defibrillator) present    [facility] [employer] MOMENTUM EL ICD D121/ [phone]   Aneurysm [facility])    a. Aneurysmal infrarenal aorta up to 33 mm on CT 10/2014, recommended f/u due 10/2017   Anginal pain (HCC)    Anxiety    Arthritis    right foot   Basal cell carcinoma of nose    S/P MOHS   Biliary acute pancreatitis    CAD (coronary artery disease)    a. s/p MI in 1994 with PCI to LAD at that time b. cath 10/2012 demonstrated EF 30%, inferior akinesis with mild hypokinesis of all walls, patent LAD and RCA stents; ostial PDA with 80-90% obstruction with medical therapy recommended    Chronic systolic CHF (congestive heart failure) (HCC)    EF 30 to 35 % as of 09/2014.    CKD (chronic kidney disease), stage III (HCC)    Complication of anesthesia 10/2014   "had to have defibrillator w/ERCP"- CODED after having gallstones removed   COPD (chronic obstructive pulmonary disease) (HCC)    a.  followed by pulmonary, COPD GOLD stage II   Depression    Diverticulosis of colon 07/2014   noted on CT   Dyspnea    with exertion   GERD (gastroesophageal reflux disease)    Hiatal hernia    large   History of kidney stones    passed stone   Hyperglycemia 10/2012   Hyperlipidemia    Hypertension    Myocardial infarction [facility]) 1994; 2011   Pneumonia 1946; 2015   Prostate enlargement 07/2014   observed on CT   Tobacco abuse    Ventricular tachycardia (HCC)    a. 08/2009 s/p BSX E110 Teligen 100 AICD, ser#: [id number];  b. 08/2008 VT req ATP - detection reprogrammed from 160 to 150. c. EPS and VT ablation by Dr. Ladona Ridgel 12/21/2014    Current Outpatient Medications on File Prior to Visit  Medication Sig Dispense Refill   acetaminophen (TYLENOL) 500 MG tablet Take 1,000 mg by mouth every 6 (six) hours as needed for moderate pain.     albuterol (PROVENTIL) (2.5 MG/3ML) 0.083% nebulizer solution USE 1 VIAL VIA NEBULIZER EVERY 6 HOURS AS NEEDED FOR WHEEZING OR SHORTNESS OF BREATH 360 mL 0   amiodarone (PACERONE) 200 MG tablet  TAKE ONE TABLET BY MOUTH DAILY 90 tablet 3   apixaban (ELIQUIS) 5 MG TABS tablet Take 1 tablet (5 mg total) by mouth 2 (two) times daily. 180 tablet 1   Artificial Tear Solution (SOOTHE XP OP) Place 2 drops into both eyes daily as needed (dry eyes).     Ascorbic Acid (VITAMIN C) 1000 MG tablet Take 1,000 mg by mouth daily.     aspirin EC 81 MG tablet Take 1 tablet (81 mg total) by mouth daily. Swallow whole.     Biotin 5000 MCG CAPS Take 5,000 mcg by mouth daily.     busPIRone (BUSPAR) 15 MG tablet Take 15 mg by mouth 2 (two) times daily.     Calcium Citrate-Vitamin D (CALCIUM + D PO) Take 1 tablet by mouth daily.     carvedilol (COREG) 3.125 MG tablet Take 1 tablet (3.125 mg total) by mouth 2 (two) times daily. 180 tablet 3   cetirizine (ZYRTEC) 10 MG tablet Take 10 mg by mouth daily.     Cyanocobalamin (B12 LIQUID HEALTH BOOSTER PO) Take 5,000 mcg by mouth daily.      Docusate Sodium (DSS) 100 MG CAPS Take 2 capsules by mouth daily in the afternoon.     empagliflozin (JARDIANCE) 10 MG TABS tablet Take 1 tablet (10 mg total) by mouth daily. 30 tablet 9   escitalopram (LEXAPRO) 10 MG tablet Take 20 mg by mouth daily.     fluticasone (FLONASE) 50 MCG/ACT nasal spray Place 2 sprays into both nostrils daily. 16 g 6   guaiFENesin (MUCINEX) 600 MG 12 hr tablet Take 1 tablet by mouth 2 (two) times daily.     Ipratropium-Albuterol (COMBIVENT RESPIMAT) 20-100 MCG/ACT AERS respimat Inhale 1 puff into the lungs every 6 (six) hours. Shortness of breath or wheezing (Patient taking differently: Inhale 1 puff into the lungs every 6 (six) hours as needed for shortness of breath or wheezing.) 4 g 5   IRON PO Take 1 tablet by mouth daily.     Magnesium 200 MG TABS Take 400 mg by mouth daily.     melatonin 5 MG TABS Take 10 mg by mouth at bedtime.     mexiletine (MEXITIL) 200 MG capsule Take 1 capsule (200 mg total) by mouth 2 (two) times daily. 180 capsule 3   Multiple Vitamins-Minerals (CENTRUM ADULTS PO) Take 1 tablet by mouth daily.     mupirocin ointment (BACTROBAN) 2 % Apply 1 Application topically daily as needed (wound care).     nitroGLYCERIN (NITROSTAT) 0.4 MG SL tablet Place 1 tablet (0.4 mg total) under the tongue every 5 (five) minutes as needed for chest pain. DISSOLVE 1 TABLET UNDER THE TONGUE EVERY 5 MINUTES FOR 3 DOSES 25 tablet 3   omeprazole (PRILOSEC) 20 MG capsule Take 20 mg by mouth daily.      polyethylene glycol powder (GLYCOLAX/MIRALAX) 17 GM/SCOOP powder Take 1 capful in 8 ounces of fluid each morning (Patient taking differently: Take 17 g by mouth daily as needed for moderate constipation.) 255 g 0   potassium chloride (KLOR-CON) 10 MEQ tablet Take 1 tablet (10 mEq total) by mouth daily. Take as needed only when you take your lasix 30 tablet 1   pregabalin (LYRICA) 25 MG capsule Take by mouth in the morning and at bedtime.     Respiratory Therapy Supplies  (FLUTTER) DEVI Use as directed (Patient taking differently: 1 each by Other route See admin instructions. Use as directed) 1 each 0   rosuvastatin (  CRESTOR) 20 MG tablet Take 1 tablet (20 mg total) by mouth daily. 90 tablet 3   sacubitril-valsartan (ENTRESTO) 24-26 MG Take 1 tablet by mouth 2 (two) times daily. 180 tablet 2   Spacer/Aero-Holding Chambers (OPTICHAMBER DIAMOND) MISC optichamber [facility] (Patient taking differently: 1 each by Other route See admin instructions. optichamber [facility]) 1 each 0   spironolactone (ALDACTONE) 25 MG tablet Take 0.5 tablets (12.5 mg total) by mouth daily. 45 tablet 3   STIOLTO RESPIMAT 2.5-2.5 MCG/ACT AERS Inhale 2 puffs into the lungs daily.     tamsulosin (FLOMAX) 0.4 MG CAPS capsule TAKE ONE CAPSULE BY MOUTH DAILY AFTER SUPPER 90 capsule 3   No current facility-administered medications on file prior to visit.    Allergies  Allergen Reactions   Sulfa Antibiotics Hives    Assessment/Plan:  1. Hyperlipidemia - Patient last LDL 145 which is above goal of <55. Aggressive goal due to history of NSTEMI, smoking, and CHF. LDL likely also elevated since he has been off statins.   Advised we could recheck lipid panel in about 2-3 months after starting statin and then proceed to next step if still above goal. Patient requests to be educated on and start PCSK9i now. Using demo pen, educated patient on mechanism of action, storage, site selection, administration, and possible adverse effects. Will complete PA and contact patient when approved. Recheck lipid panel in 2-3 months.  Continue rosuvastatin 20mg  daily Start Repatha 140mg  q 2 weeks Recheck lipid panel in 2-3 months  Laural Golden, PharmD, BCACP, CDCES, CPP [address] [place], [place], [address] Phone: [phone], Fax: [phone]

## 2023-04-22 NOTE — Patient Instructions (Addendum)
It was nice meeting you today  We would like your LDL (bad cholesterol) to be less than 55  Please continue your rosuvastatin 20mg  daily  The medication we discussed today is called Repatha which is an injection you would take once every 2 weeks  I will complete the prior authorization for you and contact you when it is approved  Once you start the medication we will recheck your fasting lipid panel in 2-3 months  Please let us know if you have any questions  Laural Golden, PharmD, BCACP, CDCES, CPP [address], [address] [place], [place], [address] Phone: [phone], Fax: [phone]

## 2023-04-22 NOTE — Telephone Encounter (Signed)
Please complete PA for Repatha 

## 2023-04-23 ENCOUNTER — Other Ambulatory Visit (HOSPITAL_COMMUNITY): Payer: Self-pay

## 2023-04-23 ENCOUNTER — Telehealth: Payer: Self-pay | Admitting: Pharmacy Technician

## 2023-04-23 DIAGNOSIS — E785 Hyperlipidemia, unspecified: Secondary | ICD-10-CM

## 2023-04-23 DIAGNOSIS — I214 Non-ST elevation (NSTEMI) myocardial infarction: Secondary | ICD-10-CM

## 2023-04-23 DIAGNOSIS — I2510 Atherosclerotic heart disease of native coronary artery without angina pectoris: Secondary | ICD-10-CM

## 2023-04-23 MED ORDER — REPATHA SURECLICK 140 MG/ML ~~LOC~~ SOAJ: 1.0000 mL | SUBCUTANEOUS | 1 refills | Status: DC

## 2023-04-23 NOTE — Telephone Encounter (Signed)
Pharmacy Patient Advocate Encounter   Received notification from Pt Calls Messages that prior authorization for repatha is required/requested.   Insurance verification completed.   The patient is insured through  [employer]  .   Per test claim: The current 04/23/23 day co-pay is, $24.99 one month.  No PA needed at this time. This test claim was processed through [facility]- copay amounts may vary at other pharmacies due to pharmacy/plan contracts, or as the patient moves through the different stages of their insurance plan.

## 2023-04-23 NOTE — Addendum Note (Signed)
Addended by: Cheree Ditto on: 04/23/2023 08:38 AM   Modules accepted: Orders

## 2023-04-26 ENCOUNTER — Other Ambulatory Visit: Payer: Self-pay

## 2023-04-26 DIAGNOSIS — I5022 Chronic systolic (congestive) heart failure: Secondary | ICD-10-CM

## 2023-04-26 DIAGNOSIS — I25810 Atherosclerosis of coronary artery bypass graft(s) without angina pectoris: Secondary | ICD-10-CM

## 2023-04-26 MED ORDER — CARVEDILOL 3.125 MG PO TABS: 3.1250 mg | ORAL_TABLET | Freq: Two times a day (BID) | ORAL | 3 refills | Status: DC

## 2023-05-11 ENCOUNTER — Telehealth: Payer: Self-pay

## 2023-05-11 DIAGNOSIS — I472 Ventricular tachycardia, unspecified: Secondary | ICD-10-CM

## 2023-05-11 NOTE — Telephone Encounter (Signed)
Orders placed for CMET, free T4, TSH, and Mg level per AT. Pt made aware.

## 2023-05-11 NOTE — Telephone Encounter (Signed)
Alert remote transmission: ATP VT zone event on 2/7 at 2133 with V-rate 174 bpm and V>A treated with ATP x 1. Sending for review.  Follow up as scheduled. [place], [facility]  Called and spoke w/ pt. Pt was near syncopal.  Asymptomatic since and now. Compliant w/ medications. Driving restrictions advised x 6months and shock plan reviewed. Pt verbalized understanding and is happy to receive the call about it and that it worked. Confirmed 2/19 appointment w/ pt and importance of coming. Added to appointment notes. Routed to AT PA for review.

## 2023-05-15 ENCOUNTER — Encounter: Payer: Self-pay | Admitting: Pharmacist

## 2023-05-17 ENCOUNTER — Other Ambulatory Visit: Payer: Self-pay | Admitting: *Deleted

## 2023-05-17 ENCOUNTER — Telehealth: Payer: Self-pay | Admitting: Cardiovascular Disease

## 2023-05-17 DIAGNOSIS — I472 Ventricular tachycardia, unspecified: Secondary | ICD-10-CM

## 2023-05-17 MED ORDER — AMIODARONE HCL 200 MG PO TABS: 200.0000 mg | ORAL_TABLET | Freq: Every day | ORAL | 0 refills | Status: DC

## 2023-05-17 NOTE — Telephone Encounter (Signed)
*  STAT* If patient is at the pharmacy, call can be transferred to refill team.   1. Which medications need to be refilled? (please list name of each medication and dose if known)   amiodarone (PACERONE) 200 MG tablet    2. Which pharmacy/location (including street and city if local pharmacy) is medication to be sent to?  [facility] [id number] - [place], [place] - [address]      3. Do they need a 30 day or 90 day supply? 90 day

## 2023-05-18 LAB — LIPID PANEL
Chol/HDL Ratio: 4 {ratio} (ref 0.0–5.0)
Cholesterol, Total: 198 mg/dL (ref 100–199)
HDL: 49 mg/dL (ref >39)
LDL Chol Calc (NIH): 129 mg/dL — ABNORMAL HIGH (ref 0–99)
Triglycerides: 113 mg/dL (ref 0–149)
VLDL Cholesterol Cal: 20 mg/dL (ref 5–40)

## 2023-05-19 ENCOUNTER — Ambulatory Visit: Payer: Medicare Other | Admitting: Student

## 2023-05-19 LAB — COMPREHENSIVE METABOLIC PANEL
ALT: 15 [IU]/L (ref 0–44)
AST: 17 [IU]/L (ref 0–40)
Albumin: 3.9 g/dL (ref 3.7–4.7)
Alkaline Phosphatase: 49 [IU]/L (ref 44–121)
BUN/Creatinine Ratio: 10 (ref 10–24)
BUN: 9 mg/dL (ref 8–27)
Bilirubin Total: 0.5 mg/dL (ref 0.0–1.2)
CO2: 26 mmol/L (ref 20–29)
Calcium: 9.3 mg/dL (ref 8.6–10.2)
Chloride: 100 mmol/L (ref 96–106)
Creatinine, Ser: 0.87 mg/dL (ref 0.76–1.27)
Globulin, Total: 2.2 g/dL (ref 1.5–4.5)
Glucose: 81 mg/dL (ref 70–99)
Potassium: 4.4 mmol/L (ref 3.5–5.2)
Sodium: 140 mmol/L (ref 134–144)
Total Protein: 6.1 g/dL (ref 6.0–8.5)
eGFR: 85 mL/min/{1.73_m2} (ref >59)

## 2023-05-19 LAB — TSH: TSH: 1.78 u[IU]/mL (ref 0.450–4.500)

## 2023-05-19 LAB — T4, FREE: Free T4: 1.58 ng/dL (ref 0.82–1.77)

## 2023-05-19 LAB — MAGNESIUM: Magnesium: 1.9 mg/dL (ref 1.6–2.3)

## 2023-05-24 NOTE — Addendum Note (Signed)
 Addended by: Geralyn Flash D on: 05/24/2023 12:45 PM   Modules accepted: Orders

## 2023-05-24 NOTE — Progress Notes (Signed)
 Remote ICD transmission.

## 2023-06-11 ENCOUNTER — Ambulatory Visit: Payer: Medicare Other | Attending: Student | Admitting: Student

## 2023-06-11 ENCOUNTER — Encounter: Payer: Self-pay | Admitting: Student

## 2023-06-11 VITALS — BP 104/66 | HR 66 | Ht 70.0 in | Wt 161.2 lb

## 2023-06-11 DIAGNOSIS — I480 Paroxysmal atrial fibrillation: Secondary | ICD-10-CM

## 2023-06-11 DIAGNOSIS — I5022 Chronic systolic (congestive) heart failure: Secondary | ICD-10-CM

## 2023-06-11 DIAGNOSIS — I214 Non-ST elevation (NSTEMI) myocardial infarction: Secondary | ICD-10-CM | POA: Diagnosis not present

## 2023-06-11 DIAGNOSIS — I2510 Atherosclerotic heart disease of native coronary artery without angina pectoris: Secondary | ICD-10-CM | POA: Diagnosis not present

## 2023-06-11 DIAGNOSIS — I472 Ventricular tachycardia, unspecified: Secondary | ICD-10-CM | POA: Diagnosis not present

## 2023-06-11 LAB — CUP PACEART INCLINIC DEVICE CHECK
Date Time Interrogation Session: 20250314130503
HighPow Impedance: 56 Ohm
Implantable Lead Connection Status: 753985
Implantable Lead Connection Status: 753985
Implantable Lead Implant Date: 20110610
Implantable Lead Implant Date: 20110610
Implantable Lead Location: 753859
Implantable Lead Location: 753860
Implantable Lead Model: 185
Implantable Lead Model: 4135
Implantable Lead Serial Number: 28681386
Implantable Lead Serial Number: 339643
Implantable Pulse Generator Implant Date: 20240122
Lead Channel Impedance Value: 428 Ohm
Lead Channel Impedance Value: 727 Ohm
Lead Channel Pacing Threshold Amplitude: 1.1 V
Lead Channel Pacing Threshold Amplitude: 2.2 V
Lead Channel Pacing Threshold Pulse Width: 0.4 ms
Lead Channel Pacing Threshold Pulse Width: 0.4 ms
Lead Channel Sensing Intrinsic Amplitude: 13.4 mV
Lead Channel Sensing Intrinsic Amplitude: 4.7 mV
Lead Channel Setting Pacing Amplitude: 2 V
Lead Channel Setting Pacing Amplitude: 2.4 V
Lead Channel Setting Pacing Pulse Width: 0.4 ms
Lead Channel Setting Sensing Sensitivity: 0.5 mV
Pulse Gen Serial Number: 238282

## 2023-06-11 NOTE — Progress Notes (Signed)
  Cardiology Office Note:   Date:  06/11/2023  ID:  Steven Guzman, Steven Guzman 24-Apr-1940, MRN [id number]  Primary Cardiologist: Thurmon Fair, MD Electrophysiologist: Lewayne Bunting, MD   History of Present Illness:   Steven Guzman is a 83 y.o. male with h/o CAD, Chronic systolic CHF, VT, PAF, HTN, HLD, COPD and GERD seen today for routine electrophysiology followup.   Pt had appropriately treated VT with ATP in February. See phone note 2/11. Asymptomatic. Labs stable at that time.   Since last being seen in our clinic the patient reports doing well overall. Taking medications as directed. No new or undue SOB. No chest pain. Feels like his energy has improved somewhat.  he denies chest pain, palpitations, dyspnea, PND, orthopnea, nausea, vomiting, dizziness, syncope, edema, weight gain, or early satiety.   Review of systems complete and found to be negative unless listed in HPI.    EP Information / Studies Reviewed:    EKG is not ordered today. EKG from 04/12/2023 reviewed which showed sinus brady at 54 bpm  Arrhythmia/Device History Barostim (standard) implanted 12/17/2022 for Chronic systolic CHF [employer] dual chamber ICD implanted 08/2009, gen change 03/2022 for CHF    Barostim Interrogation- Not performed today. Site is well healed.   ICD/PPM interrogation - Performed personally and reviewed in detail today.    Physical Exam:   VS:  BP 104/66   Pulse 66   Ht 5\' 10"  (1.778 m)   Wt 161 lb 3.2 oz (73.1 kg)   SpO2 94%   BMI 23.13 kg/m    Wt Readings from Last 3 Encounters:  06/11/23 161 lb 3.2 oz (73.1 kg)  04/21/23 168 lb 12.8 oz (76.6 kg)  04/12/23 172 lb 13.5 oz (78.4 kg)     GEN: Well nourished, well developed in no acute distress NECK: No JVD; No carotid bruits CARDIAC: Regular rate and rhythm, no murmurs, rubs, gallops RESPIRATORY:  Clear to auscultation without rales, wheezing or rhonchi  ABDOMEN: Soft, non-tender, non-distended EXTREMITIES:  No edema; No  deformity   ASSESSMENT AND PLAN:    Chronic systolic CHF s/p Chief Financial Officer and Barostim implantation NYHA II-III symptoms.   Device programmed at 8.0 for chronic settings  Device impedence stable. Pt goals are to continue to increase his functional status.   Normal device function See scanned report. His BAT site is now well healed.  Will update Echo in May, 6 months post Barostim.   PAF AT Low burden. Some of his "NSVT" episodes represent brief AT.  Continue Eliquis 5 mg BID for CHA2DS2VASc of at least 5 Continue amiodarone 200 mg daily Surveillance labs stable February.    VT Appropriate therapy 2/7 and none since. Continue current regimen at this time.  Continue amiodarone as above Continue mexitil 200 mg BID  CAD Denies s/s ischemia  Disposition:   Follow up with EP APP in in 3 months given recent ATP, and will be due for Barostim check.   Signed, Graciella Freer, PA-C

## 2023-06-11 NOTE — Patient Instructions (Signed)
 Medication Instructions:  Your physician recommends that you continue on your current medications as directed. Please refer to the Current Medication list given to you today.  *If you need a refill on your cardiac medications before your next appointment, please call your pharmacy*   Lab Work: None ordered If you have labs (blood work) drawn today and your tests are completely normal, you will receive your results only by: MyChart Message (if you have MyChart) OR A paper copy in the mail If you have any lab test that is abnormal or we need to change your treatment, we will call you to review the results.   Testing/Procedures: Your physician has requested that you have an echocardiogram in 2 months. Echocardiography is a painless test that uses sound waves to create images of your heart. It provides your doctor with information about the size and shape of your heart and how well your heart's chambers and valves are working. This procedure takes approximately one hour. There are no restrictions for this procedure. Please do NOT wear cologne, perfume, aftershave, or lotions (deodorant is allowed). Please arrive 15 minutes prior to your appointment time.  Please note: We ask at that you not bring children with you during ultrasound (echo/ vascular) testing. Due to room size and safety concerns, children are not allowed in the ultrasound rooms during exams. Our front office staff cannot provide observation of children in our lobby area while testing is being conducted. An adult accompanying a patient to their appointment will only be allowed in the ultrasound room at the discretion of the ultrasound technician under special circumstances. We apologize for any inconvenience.    Follow-Up: At [facility], you and your health needs are our priority.  As part of our continuing mission to provide you with exceptional heart care, we have created designated Provider Care Teams.  These Care Teams  include your primary Cardiologist (physician) and Advanced Practice Providers (APPs -  Physician Assistants and Nurse Practitioners) who all work together to provide you with the care you need, when you need it.  Your next appointment:   3 month(s)  Provider:   Casimiro Needle "Otilio Saber, PA-C

## 2023-07-12 ENCOUNTER — Ambulatory Visit (INDEPENDENT_AMBULATORY_CARE_PROVIDER_SITE_OTHER): Payer: Medicare Other

## 2023-07-12 DIAGNOSIS — I472 Ventricular tachycardia, unspecified: Secondary | ICD-10-CM | POA: Diagnosis not present

## 2023-07-12 DIAGNOSIS — I5022 Chronic systolic (congestive) heart failure: Secondary | ICD-10-CM

## 2023-07-13 ENCOUNTER — Encounter: Payer: Self-pay | Admitting: Internal Medicine

## 2023-07-13 LAB — CUP PACEART REMOTE DEVICE CHECK
Battery Remaining Longevity: 156 mo
Battery Remaining Percentage: 100 %
Brady Statistic RA Percent Paced: 7 %
Brady Statistic RV Percent Paced: 5 %
Date Time Interrogation Session: 20250414014300
HighPow Impedance: 56 Ohm
Implantable Lead Connection Status: 753985
Implantable Lead Connection Status: 753985
Implantable Lead Implant Date: 20110610
Implantable Lead Implant Date: 20110610
Implantable Lead Location: 753859
Implantable Lead Location: 753860
Implantable Lead Model: 185
Implantable Lead Model: 4135
Implantable Lead Serial Number: 28681386
Implantable Lead Serial Number: 339643
Implantable Pulse Generator Implant Date: 20240122
Lead Channel Impedance Value: 430 Ohm
Lead Channel Impedance Value: 699 Ohm
Lead Channel Pacing Threshold Amplitude: 1.6 V
Lead Channel Pacing Threshold Pulse Width: 0.4 ms
Lead Channel Setting Pacing Amplitude: 2 V
Lead Channel Setting Pacing Amplitude: 2.4 V
Lead Channel Setting Pacing Pulse Width: 0.4 ms
Lead Channel Setting Sensing Sensitivity: 0.5 mV
Pulse Gen Serial Number: 238282

## 2023-07-19 ENCOUNTER — Ambulatory Visit: Payer: Medicare Other | Attending: Emergency Medicine | Admitting: Emergency Medicine

## 2023-07-19 ENCOUNTER — Encounter: Payer: Self-pay | Admitting: Emergency Medicine

## 2023-07-19 VITALS — BP 96/64 | HR 61 | Ht 70.5 in | Wt 159.0 lb

## 2023-07-19 DIAGNOSIS — I10 Essential (primary) hypertension: Secondary | ICD-10-CM

## 2023-07-19 DIAGNOSIS — I2510 Atherosclerotic heart disease of native coronary artery without angina pectoris: Secondary | ICD-10-CM

## 2023-07-19 DIAGNOSIS — I255 Ischemic cardiomyopathy: Secondary | ICD-10-CM

## 2023-07-19 DIAGNOSIS — I7121 Aneurysm of the ascending aorta, without rupture: Secondary | ICD-10-CM | POA: Diagnosis not present

## 2023-07-19 DIAGNOSIS — Z95810 Presence of automatic (implantable) cardiac defibrillator: Secondary | ICD-10-CM

## 2023-07-19 DIAGNOSIS — E782 Mixed hyperlipidemia: Secondary | ICD-10-CM

## 2023-07-19 DIAGNOSIS — Z720 Tobacco use: Secondary | ICD-10-CM

## 2023-07-19 DIAGNOSIS — I472 Ventricular tachycardia, unspecified: Secondary | ICD-10-CM

## 2023-07-19 DIAGNOSIS — I5022 Chronic systolic (congestive) heart failure: Secondary | ICD-10-CM

## 2023-07-19 DIAGNOSIS — I7143 Infrarenal abdominal aortic aneurysm, without rupture: Secondary | ICD-10-CM

## 2023-07-19 NOTE — Patient Instructions (Addendum)
 Medication Instructions:  No medication changes were made during today's visit.   Monitor your blood pressure. Let us  know if the diastolic pressure is consistently below 80.  *If you need a refill on your cardiac medications before your next appointment, please call your pharmacy*   Lab Work: No labs were ordered during today's visit.  If you have labs (blood work) drawn today and your tests are completely normal, you will receive your results only by: MyChart Message (if you have MyChart) OR A paper copy in the mail If you have any lab test that is abnormal or we need to change your treatment, we will call you to review the results.   Testing/Procedures: Non-Cardiac CT Angiography (CTA), is a special type of CT scan that uses a computer to produce multi-dimensional views of major blood vessels throughout the body. In CT angiography, a contrast material is injected through an IV to help visualize the blood vessels    Follow-Up: At [facility], you and your health needs are our priority.  As part of our continuing mission to provide you with exceptional heart care, we have created designated Provider Care Teams.  These Care Teams include your primary Cardiologist (physician) and Advanced Practice Providers (APPs -  Physician Assistants and Nurse Practitioners) who all work together to provide you with the care you need, when you need it.  We recommend signing up for the patient portal called "MyChart".  Sign up information is provided on this After Visit Summary.  MyChart is used to connect with patients for Virtual Visits (Telemedicine).  Patients are able to view lab/test results, encounter notes, upcoming appointments, etc.  Non-urgent messages can be sent to your provider as well.   To learn more about what you can do with MyChart, go to [email or web address].    Your next appointment:   6 month(s)  Provider:   Palmer Bobo     Other Instructions [facility]  [address] [place], [place]  [address] OPENING APRIL 28,2025       1st Floor: - Lobby - Registration  - Pharmacy  - Lab - Cafe   2nd Floor: - PV Lab - Diagnostic Testing (echo, CT, nuclear med)   3rd Floor: - Vacant   4th Floor: - TCTS (cardiothoracic surgery) - AFib Clinic - Structural Heart Clinic - Vascular Surgery  - Vascular Ultrasound   5th Floor: - HeartCare Cardiology (general and EP) - Clinical Pharmacy for coumadin, hypertension, lipid, weight-loss medications, and med management appointments      Valet parking services will be available as well.

## 2023-07-19 NOTE — Progress Notes (Unsigned)
 Cardiology Office Note:    Date:  07/20/2023  ID:  Steven Guzman 09-Nov-1940, MRN [id number] PCP: Steven Guzman  [facility] Providers Cardiologist:  Steven Rumple, MD Electrophysiologist:  Steven Sells, MD       Patient Profile:      Chief Complaint: 40-month follow-up for coronary artery disease and HFrEF History of Present Illness:  Steven Guzman is a 83 y.o. male with visit-pertinent history of CAD s/p inferior MI complicated by cardiac arrest in 1995 s/p stenting to RCA, PCI with stenting to LAD in 2011, NSTEMI in 03/18/2022 due to occlusion of OM 3 treated medically, ischemic cardiomyopathy, chronic HFrEF with an EF of 30% s/p Barostim in 11/2022, VT s/p [employer] ICD in 2010 and ablation in 2016 on amiodarone , PAF on Eliquis , AAA, hypertension, hyperlipidemia, COPD, GERD, hiatal hernia.   He does have a long history of CAD with prior interventions to RCA in 1995 and LAD in 2011.  Most recent ischemic evaluation on 02/2022 in the setting of NSTEMI which showed patent stents to RCA and LAD and 100% occlusion of distal OM 3 (culprit lesion) as well as severe 80% stenosis of ostial PDA which was unchanged from 2014.  Medical therapy was recommended at that time.  He has history of ischemic cardiomyopathy/chronic HFrEF/VT, he underwent placement of [employer] ICD in 2010 and had VT ablation 2016 with subsequent ICD change in January 2024.  Most recent echocardiogram in April 2024 showed LVEF of 30% with akinesis of basal inferoseptal, basal to mid inferior, and inferolateral walls and hypokinesis of anterolateral wall, LVH, grade 1 DD, mildly enlarged RV, mild biatrial enlargement, no significant valvular disease.  He has had chronic dyspnea on exertion and underwent placement of right sided Barostim in September 2024.   He was seen in clinic by [facility], [name] on 01/19/2023 where he was noted to have some lower extremity edema he was given a short course of  Lasix  and recommended as needed use after.  His BNP was negative.   He was seen in the ED on 04/12/2023 with complaints of chest pain.  The chest pain was left-sided, initially sharp then became dull.  He took a nitroglycerin  at home without any relief.  High-sensitivity troponins unremarkable at 10, 11.  Chest x-ray showed no acute cardiopulmonary findings.  He was seen for follow-up on 04/21/2023.  He had been without any chest pain or exertional angina since ED visit.  Given his episode of chest pain was atypical and musculoskeletal in nature further ischemic evaluation was not pursued at this time.  Seen by Pharm.D. lipid clinic on 04/22/2023.  He was started on Repatha  140 mg every 2 weeks.  He was last seen by EP on 06/11/2023.  It was noted that patient had appropriately treated VT with ATP on 05/07/2023.  He was doing well from a cardiac standpoint.  Echocardiogram was ordered to be completed in May 2025 for 6 months post barostim.  No medication changes were made he was to follow-up with EP in 3 months.   Discussed the use of AI scribe software for clinical note transcription with the patient, who gave verbal consent to proceed.  History of Present Illness Steven Guzman arrives to clinic today by himself.  He denies any acute cardiovascular concerns or complaints today.  Notes since his last office visit he has been doing well overall.  He notes last week he developed some severe back pain that kept him pretty sedentary  for several days.  The pain was relieved by lying on a heating pad for about 30 minutes.  Today he denies any further back pain.    The pain was so severe that he was unable to walk and spent most of the time sitting. He describes the pain as being located in his back, particularly under his shoulder blade. The pain is relieved by lying on a heating pad for about 30 minutes.  He denies any chest pains.  He notes that his shortness of breath has been steadily improving since the Barostim  procedure.  He notes that he has picked up smoking within the last several months, he he hopes to quit once more.  He further denies any palpitations, PND, orthopnea, N/V/D, dizziness, lightheadedness, syncope, weight gain.  He also reports that his wife is having health issues, including serious knee and back problems, which is causing additional stress. He is concerned about his wife's health and the impact it is having on his own health.  Review of systems:  Please see the history of present illness. All other systems are reviewed and otherwise negative.     Home Medications:    Current Meds  Medication Sig   acetaminophen  (TYLENOL ) 500 MG tablet Take 1,000 mg by mouth every 6 (six) hours as needed for moderate pain.   albuterol  (PROVENTIL ) (2.5 MG/3ML) 0.083% nebulizer solution USE 1 VIAL VIA NEBULIZER EVERY 6 HOURS AS NEEDED FOR WHEEZING OR SHORTNESS OF BREATH   amiodarone  (PACERONE ) 200 MG tablet Take 1 tablet (200 mg total) by mouth daily. Please schedule appointment for future refills. Thank you   apixaban  (ELIQUIS ) 5 MG TABS tablet Take 1 tablet (5 mg total) by mouth 2 (two) times daily.   Artificial Tear Solution (SOOTHE XP OP) Place 2 drops into both eyes daily as needed (dry eyes).   Ascorbic Acid (VITAMIN C) 1000 MG tablet Take 1,000 mg by mouth daily.   aspirin  EC 81 MG tablet Take 1 tablet (81 mg total) by mouth daily. Swallow whole.   Biotin 5000 MCG CAPS Take 5,000 mcg by mouth daily.   Calcium  Citrate-Vitamin D  (CALCIUM  + D PO) Take 1 tablet by mouth daily.   carvedilol  (COREG ) 3.125 MG tablet Take 1 tablet (3.125 mg total) by mouth 2 (two) times daily.   cetirizine (ZYRTEC) 10 MG tablet Take 10 mg by mouth daily.   Cyanocobalamin  (B12 LIQUID HEALTH BOOSTER PO) Take 5,000 mcg by mouth daily.   Docusate Sodium  (DSS) 100 MG CAPS Take 2 capsules by mouth daily in the afternoon.   empagliflozin  (JARDIANCE ) 10 MG TABS tablet Take 1 tablet (10 mg total) by mouth daily.    escitalopram  (LEXAPRO ) 10 MG tablet Take 20 mg by mouth daily.   Evolocumab  (REPATHA  SURECLICK) 140 MG/ML SOAJ Inject 140 mg into the skin every 14 (fourteen) days.   fluticasone  (FLONASE ) 50 MCG/ACT nasal spray Place 2 sprays into both nostrils daily.   guaiFENesin  (MUCINEX ) 600 MG 12 hr tablet Take 1 tablet by mouth 2 (two) times daily.   Ipratropium-Albuterol  (COMBIVENT  RESPIMAT) 20-100 MCG/ACT AERS respimat Inhale 1 puff into the lungs every 6 (six) hours. Shortness of breath or wheezing (Patient taking differently: Inhale 1 puff into the lungs every 6 (six) hours as needed for shortness of breath or wheezing.)   IRON PO Take 1 tablet by mouth daily.   Magnesium  200 MG TABS Take 400 mg by mouth daily.   melatonin 5 MG TABS Take 10 mg by mouth at  bedtime.   mexiletine (MEXITIL ) 200 MG capsule Take 1 capsule (200 mg total) by mouth 2 (two) times daily.   Multiple Vitamins-Minerals (CENTRUM ADULTS PO) Take 1 tablet by mouth daily.   mupirocin  ointment (BACTROBAN ) 2 % Apply 1 Application topically daily as needed (wound care).   nitroGLYCERIN  (NITROSTAT ) 0.4 MG SL tablet Place 1 tablet (0.4 mg total) under the tongue every 5 (five) minutes as needed for chest pain. DISSOLVE 1 TABLET UNDER THE TONGUE EVERY 5 MINUTES FOR 3 DOSES   omeprazole (PRILOSEC) 20 MG capsule Take 20 mg by mouth daily.    polyethylene glycol powder (GLYCOLAX /MIRALAX ) 17 GM/SCOOP powder Take 1 capful in 8 ounces of fluid each morning (Patient taking differently: Take 17 g by mouth daily as needed for moderate constipation.)   pregabalin (LYRICA) 25 MG capsule Take by mouth in the morning and at bedtime.   Respiratory Therapy Supplies (FLUTTER) DEVI Use as directed (Patient taking differently: 1 each by Other route See admin instructions. Use as directed)   sacubitril -valsartan  (ENTRESTO ) 24-26 MG Take 1 tablet by mouth 2 (two) times daily.   Spacer/Aero-Holding Idelle Majors [facility] DIAMOND ) MISC optichamber [facility] (Patient taking  differently: 1 each by Other route See admin instructions. optichamber [facility])   spironolactone  (ALDACTONE ) 25 MG tablet Take 0.5 tablets (12.5 mg total) by mouth daily.   STIOLTO RESPIMAT 2.5-2.5 MCG/ACT AERS Inhale 2 puffs into the lungs daily.   tamsulosin  (FLOMAX ) 0.4 MG CAPS capsule TAKE ONE CAPSULE BY MOUTH DAILY AFTER SUPPER   Studies Reviewed:       Carotid ultrasound duplex 11/23/2022 Right Carotid: Velocities in the right ICA are consistent with a 1-39%  stenosis.   Left Carotid: Velocities in the left ICA are consistent with a 1-39%  stenosis.   Vertebrals: Bilateral vertebral arteries demonstrate antegrade flow.  Subclavians: Normal flow hemodynamics were seen in the left subclavian  artery.  Right subclavian appears monophasic, limited visualization.    Echocardiogram 07/02/2022 1. Left ventricular ejection fraction, by estimation, is 30%. The left  ventricle has moderate to severely decreased function. The left ventricle  demonstrates regional wall motion abnormalities with basal inferoseptal  akinesis, basal to mid inferior  akinesis, inferolateral akinesis, anterolateral hypokinesis. The left  ventricular internal cavity size was mildly to moderately dilated. There  is mild concentric left ventricular hypertrophy. Left ventricular  diastolic parameters are consistent with  Grade I diastolic dysfunction (impaired relaxation).   2. Right ventricular systolic function is normal. The right ventricular  size is mildly enlarged. Tricuspid regurgitation signal is inadequate for  assessing PA pressure.   3. Left atrial size was mildly dilated.   4. Right atrial size was mildly dilated.   5. The mitral valve is normal in structure. Trivial mitral valve  regurgitation. No evidence of mitral stenosis.   6. The aortic valve is tricuspid. There is moderate calcification of the  aortic valve. Aortic valve regurgitation is trivial. No aortic stenosis is  present.   7. The inferior  vena cava is normal in size with greater than 50%  respiratory variability, suggesting right atrial pressure of 3 mmHg.    Cardiac catheterization 03/19/2022 2 vessel obstructive CAD - 100% occlusion of distal OM3 appears to be the culprit. The ostial PDA lesion is unchanged from 2014 Patent stents in the LAD and RCA Mild LV dysfunction with inferior wall motion abnormality Mildly elevated LVEDP 16 mm Hg Diagnostic Dominance: Right  Risk Assessment/Calculations:    CHA2DS2-VASc Score = 5   This indicates  a 7.2% annual risk of stroke. The patient's score is based upon: CHF History: 1 HTN History: 1 Diabetes History: 0 Stroke History: 0 Vascular Disease History: 1 Age Score: 2 Gender Score: 0            Physical Exam:   VS:  BP 96/64 (BP Location: Left Arm, Patient Position: Sitting, Cuff Size: Normal)   Pulse 61   Ht 5' 10.5" (1.791 m)   Wt 159 lb (72.1 kg)   SpO2 95%   BMI 22.49 kg/m    Wt Readings from Last 3 Encounters:  07/19/23 159 lb (72.1 kg)  06/11/23 161 lb 3.2 oz (73.1 kg)  04/21/23 168 lb 12.8 oz (76.6 kg)    GEN: Well nourished, well developed in no acute distress NECK: No JVD; No carotid bruits CARDIAC: RRR, no murmurs, rubs, gallops RESPIRATORY:  Clear to auscultation without rales, wheezing or rhonchi  ABDOMEN: Soft, non-tender, non-distended EXTREMITIES:  No edema; No acute deformity     Assessment and Plan:  Coronary artery disease Prior interventions to RCA in 1995 and LAD in 2011.  Left cardiac catheterization in 02/2022 showed patent stents to RCA and LAD and 100% occlusion of distal OM3 and 80% stenosis of ostial PDA. Medical therapy was recommended at that time.  - Today and since last office visit he has been without any anginal symptoms, no indication for further ischemic evaluation at this time - Continue aspirin  81 mg daily, carvedilol  3.125 twice daily, rosuvastatin  20 mg daily, Repatha    Chornic HFrEF / Ischemic cardiomyopathy S/p  Barostim implatation 11/2022, s/p ICD in 2010 Most recent echocardiogram 06/2022 showed LVEF of 30% with akinesis of basal inferoseptal, basal to mid inferior, and inferolateral walls and hypokinesis of anterolateral wall - Today he is euvolemic and well compensated on exam.  NYHA II-III symptoms - Denies any symptoms of overt heart failure exacerbation - Notes chronic DOE improved with Barostim - GDMT limited by low normal blood pressure of 96/64 - Continue Carvedilol  3.125 mg twice daily, Jardiance  10 mg daily, Entresto  24/26 mg twice daily, Spironolactone  12.5 mg daily - Encouraged daily weights, <2 L fluid restriction, salt restriction - Repeat echocardiogram s/p Barostim insertion scheduled for 08/11/2023   VT s/p [employer] dual chamber ICD S/p ICD in 2010 with generator change 03/2022. Hx VT ablation in 2016 - Managed by EP - Continue amiodarone  200 mg daily, mexiletine 200 mg twice daily, carvedilol  3.125 mg twice daily   Paroxysmal artrial fibrillation  Quiescent He denies any symptoms concerning for recurrent PAF - Managed by EP - Continue amiodarone  200 mg daily, carvedilol  3.25 mg twice daily - Continue Eliquis  5 mg twice daily, denies bleeding concerns, adequately dosed  - Managed by EP   Hypertension  Blood pressure today 96/64 and under adequate control - Continue current antihypertensive regimen as above   Hyperlipidemia  LDL 52, HDL 55, TG 114, TC 127 on 1/61/0960 LDL under excellent control and LDL under goal less than 55 Prior statin intolerance and self discontinued Crestor  recently due to myalgias - Continue Repatha  140 mg every 2 weeks   Tobacco Use Patient resumed smoking after a period of abstinence of three to four years. Currently smoking half a pack per day. - Encouraged complete cessation   Ascending thoracic aneurysm CT angio chest aorta 04/2021 showed mild dilation of the ascending thoracic aorta with a maximum diameter 4.3 cm.  Mild dilation of  proximal descending thoracic aorta with a maximum diameter of 4.0 cm -  Repeat CT angio chest aorta today for routine surveillance  Infrarenal abdominal aortic aneurysm CT abdomen 05/2022 shows infrarenal abdominal aortic aneurysm 3.3 cm - Recommend follow-up ultrasound in 3 years for routine surveillance        Dispo:  Return in about 6 months (around 01/18/2024).  Signed, Ava Boatman, NP

## 2023-08-09 ENCOUNTER — Other Ambulatory Visit (HOSPITAL_BASED_OUTPATIENT_CLINIC_OR_DEPARTMENT_OTHER)

## 2023-08-11 ENCOUNTER — Ambulatory Visit (HOSPITAL_BASED_OUTPATIENT_CLINIC_OR_DEPARTMENT_OTHER)

## 2023-08-11 DIAGNOSIS — I5022 Chronic systolic (congestive) heart failure: Secondary | ICD-10-CM | POA: Diagnosis not present

## 2023-08-11 LAB — ECHOCARDIOGRAM COMPLETE
Area-P 1/2: 2.39 cm2
P 1/2 time: 703 ms
S' Lateral: 4.31 cm

## 2023-08-12 ENCOUNTER — Ambulatory Visit: Payer: Self-pay | Admitting: Student

## 2023-08-25 ENCOUNTER — Other Ambulatory Visit: Payer: Self-pay | Admitting: Student

## 2023-08-25 DIAGNOSIS — I472 Ventricular tachycardia, unspecified: Secondary | ICD-10-CM

## 2023-08-26 NOTE — Progress Notes (Signed)
 Remote ICD transmission.

## 2023-08-26 NOTE — Addendum Note (Signed)
 Addended by: Edra Govern D on: 08/26/2023 03:54 PM   Modules accepted: Orders

## 2023-10-07 ENCOUNTER — Telehealth: Payer: Self-pay | Admitting: Internal Medicine

## 2023-10-07 NOTE — Telephone Encounter (Signed)
 Device Alert received from [employer]: Antitachycardia pacing (ATP) therapy delivered to convert arrhythmia. Event occurred 7/4 @ 20:01, HR 172, EGM c/w V>A, pace terminated with 1 burst of ATP - Route to triage high alert per protocol RV threshold 4V @ 0.52ms, variable trend 1.6 to 5V  Outreach made to patient: Steven Guzman advised of the above event. He states he was sitting watching TV during this time and did not recall having any symptoms. He confirms compliance with Amiodarone  200 mg once daily, Coreg  3.125 mg BID, and Mexiletine 200 mg BID.  Patient made aware of no driving x 6 months from the date of his most recent event per [place] DMV.  I advised [name] that I will forward to Dr. Waddell to review and we will call back with any further recommendations if received.  The patient voices understanding and is agreeable.   He was very appreciative of the call.

## 2023-10-11 ENCOUNTER — Ambulatory Visit: Payer: Medicare Other

## 2023-10-11 ENCOUNTER — Ambulatory Visit: Payer: Self-pay | Admitting: Internal Medicine

## 2023-10-11 DIAGNOSIS — I472 Ventricular tachycardia, unspecified: Secondary | ICD-10-CM

## 2023-10-11 LAB — CUP PACEART REMOTE DEVICE CHECK
Battery Remaining Longevity: 156 mo
Battery Remaining Percentage: 100 %
Brady Statistic RA Percent Paced: 5 %
Brady Statistic RV Percent Paced: 4 %
Date Time Interrogation Session: 20250714101000
HighPow Impedance: 56 Ohm
Implantable Lead Connection Status: 753985
Implantable Lead Connection Status: 753985
Implantable Lead Implant Date: 20110610
Implantable Lead Implant Date: 20110610
Implantable Lead Location: 753859
Implantable Lead Location: 753860
Implantable Lead Model: 185
Implantable Lead Model: 4135
Implantable Lead Serial Number: 28681386
Implantable Lead Serial Number: 339643
Implantable Pulse Generator Implant Date: 20240122
Lead Channel Impedance Value: 404 Ohm
Lead Channel Impedance Value: 706 Ohm
Lead Channel Pacing Threshold Amplitude: 1.7 V
Lead Channel Pacing Threshold Pulse Width: 0.4 ms
Lead Channel Setting Pacing Amplitude: 2 V
Lead Channel Setting Pacing Amplitude: 2.4 V
Lead Channel Setting Pacing Pulse Width: 0.4 ms
Lead Channel Setting Sensing Sensitivity: 0.5 mV
Pulse Gen Serial Number: 238282

## 2023-10-31 NOTE — Telephone Encounter (Signed)
 No change in meds.

## 2023-11-10 NOTE — ED Notes (Signed)
 Pt ambulatory to restroom with steady gate   Rosina Loa Simpers, RN 11/10/23 [phone]

## 2023-11-10 NOTE — ED Notes (Signed)
 C-collar placed on patient    Steven Loa Simpers, RN 11/10/23 [phone]

## 2023-11-10 NOTE — ED Provider Notes (Signed)
 Transfer of Care Note I assumed care of Steven Guzman on 11/10/2023 at 0700 AM from Dr. Dombroski.   Briefly, Steven Guzman is a 83 y.o. male who: PMHx: CAD, heart failure, HLD, ICD, AAA, HTN, COPD, 2 falls in the last week P/w 2 falls in last week.  Had low BP in triage, level 2 fall.  Has 11th posterior rib fracture.  Was having some left flank pain.  Plan at the time of handoff: Follow-up UA.  Has tramadol  at home and lidocaine  patches for his rib fracture.   Please refer to the original provider's note for additional information regarding the care of Steven Guzman.  Reassessment: I personally reassessed the patient: Patient without any acute complaints or additional questions.   Vital Signs:  The most current vitals were  Vitals:   11/10/23 0525 11/10/23 0530 11/10/23 0625 11/10/23 0700  BP: 109/69  135/82 120/73  Patient Position:      Pulse: 57 58 65 61  Resp: 16 16 20 19   Temp:  97.3 F (36.3 C)    TempSrc:  Temporal    SpO2: 99% 99% 95% 95%  Weight:      Height:        Hemodynamics:  The patient is hemodynamically stable. Mental Status:  The patient is alert  Additional MDM: Reevaluated patient.  He denies any complaints at this time.  UA with mildly elevated specific gravity and greater than thousand glucose but no evidence of infection including negative nitrates, no WBCs and negative blood.'  Discussed reassuring results with patient.  He assures me that he has tramadol  at home, lidocaine  patches at home to help with analgesia and will follow up with PCP in 2 days. Also has incentive spirometer.  Patient verbalized understanding of instructions and all questions were answered to their satisfaction.  Patient discharged home in stable condition.  Patient seen in conjunction with Dr. Durrell  Electronically signed by:  Hoy Arvil Hopping, PA-C 11/10/2023 7:29 AM

## 2023-11-12 ENCOUNTER — Emergency Department (HOSPITAL_COMMUNITY)
Admission: EM | Admit: 2023-11-12 | Discharge: 2023-11-12 | Disposition: A | Discharge status: Home / Self Care | Attending: Emergency Medicine | Admitting: Emergency Medicine

## 2023-11-12 ENCOUNTER — Encounter (HOSPITAL_COMMUNITY): Payer: Self-pay

## 2023-11-12 ENCOUNTER — Emergency Department (HOSPITAL_COMMUNITY)

## 2023-11-12 ENCOUNTER — Other Ambulatory Visit: Payer: Self-pay

## 2023-11-12 DIAGNOSIS — N183 Chronic kidney disease, stage 3 unspecified: Secondary | ICD-10-CM | POA: Diagnosis not present

## 2023-11-12 DIAGNOSIS — I2510 Atherosclerotic heart disease of native coronary artery without angina pectoris: Secondary | ICD-10-CM | POA: Insufficient documentation

## 2023-11-12 DIAGNOSIS — Z85828 Personal history of other malignant neoplasm of skin: Secondary | ICD-10-CM | POA: Insufficient documentation

## 2023-11-12 DIAGNOSIS — I130 Hypertensive heart and chronic kidney disease with heart failure and stage 1 through stage 4 chronic kidney disease, or unspecified chronic kidney disease: Secondary | ICD-10-CM | POA: Diagnosis not present

## 2023-11-12 DIAGNOSIS — R55 Syncope and collapse: Secondary | ICD-10-CM | POA: Diagnosis not present

## 2023-11-12 DIAGNOSIS — I5022 Chronic systolic (congestive) heart failure: Secondary | ICD-10-CM | POA: Diagnosis not present

## 2023-11-12 DIAGNOSIS — J449 Chronic obstructive pulmonary disease, unspecified: Secondary | ICD-10-CM | POA: Insufficient documentation

## 2023-11-12 DIAGNOSIS — R42 Dizziness and giddiness: Secondary | ICD-10-CM | POA: Diagnosis present

## 2023-11-12 LAB — CBC WITH DIFFERENTIAL/PLATELET
Abs Immature Granulocytes: 0.04 K/uL (ref 0.00–0.07)
Basophils Absolute: 0.1 K/uL (ref 0.0–0.1)
Basophils Relative: 1 %
Eosinophils Absolute: 0 K/uL (ref 0.0–0.5)
Eosinophils Relative: 0 %
HCT: 34.5 % — ABNORMAL LOW (ref 39.0–52.0)
Hemoglobin: 10.5 g/dL — ABNORMAL LOW (ref 13.0–17.0)
Immature Granulocytes: 1 %
Lymphocytes Relative: 22 %
Lymphs Abs: 0.9 K/uL (ref 0.7–4.0)
MCH: 29.5 pg (ref 26.0–34.0)
MCHC: 30.4 g/dL (ref 30.0–36.0)
MCV: 96.9 fL (ref 80.0–100.0)
Monocytes Absolute: 0.4 K/uL (ref 0.1–1.0)
Monocytes Relative: 11 %
Neutro Abs: 2.7 K/uL (ref 1.7–7.7)
Neutrophils Relative %: 65 %
Platelets: 150 K/uL (ref 150–400)
RBC: 3.56 MIL/uL — ABNORMAL LOW (ref 4.22–5.81)
RDW: 19.1 % — ABNORMAL HIGH (ref 11.5–15.5)
WBC: 4.1 K/uL (ref 4.0–10.5)
nRBC: 0 % (ref 0.0–0.2)

## 2023-11-12 LAB — BASIC METABOLIC PANEL WITH GFR
Anion gap: 6 (ref 5–15)
BUN: 14 mg/dL (ref 8–23)
CO2: 28 mmol/L (ref 22–32)
Calcium: 8.6 mg/dL — ABNORMAL LOW (ref 8.9–10.3)
Chloride: 103 mmol/L (ref 98–111)
Creatinine, Ser: 0.68 mg/dL (ref 0.61–1.24)
GFR, Estimated: >60 mL/min (ref >60)
Glucose, Bld: 87 mg/dL (ref 70–99)
Potassium: 4.2 mmol/L (ref 3.5–5.1)
Sodium: 137 mmol/L (ref 135–145)

## 2023-11-12 LAB — TROPONIN I (HIGH SENSITIVITY)
Troponin I (High Sensitivity): 7 ng/L (ref <18)
Troponin I (High Sensitivity): 7 ng/L (ref <18)

## 2023-11-12 LAB — I-STAT CHEM 8, ED
BUN: 16 mg/dL (ref 8–23)
Calcium, Ion: 1.18 mmol/L (ref 1.15–1.40)
Chloride: 100 mmol/L (ref 98–111)
Creatinine, Ser: 0.8 mg/dL (ref 0.61–1.24)
Glucose, Bld: 81 mg/dL (ref 70–99)
HCT: 36 % — ABNORMAL LOW (ref 39.0–52.0)
Hemoglobin: 12.2 g/dL — ABNORMAL LOW (ref 13.0–17.0)
Potassium: 4.2 mmol/L (ref 3.5–5.1)
Sodium: 137 mmol/L (ref 135–145)
TCO2: 27 mmol/L (ref 22–32)

## 2023-11-12 LAB — I-STAT CG4 LACTIC ACID, ED
Lactic Acid, Venous: 0.6 mmol/L (ref 0.5–1.9)
Lactic Acid, Venous: 0.8 mmol/L (ref 0.5–1.9)

## 2023-11-12 NOTE — ED Notes (Signed)
ICD/ pacemaker interrogated.

## 2023-11-12 NOTE — ED Notes (Signed)
 Delay in EKG due to pt's baro stimulator.

## 2023-11-12 NOTE — ED Notes (Signed)
 Pt discharged. Pt given discharge papers and papers explained. Pt in NAD at this time

## 2023-11-12 NOTE — ED Provider Notes (Signed)
 [place] [facility] DEPARTMENT AT [facility] Provider Note  MDM   HPI/ROS:  Steven Guzman is a 83 y.o. male with a medical history as below who presents with dizziness that started today while at [employer].  He describes it as lightheadedness.  Earlier this morning he had a 1 second episode of substernal sharp chest pain that immediately resolved.  Due to this chest pain and his history of heart attacks he did take 1 nitro which made his dizziness worse.  He denies any recent sick symptoms but endorses a chronic cough that he relates to smoking.  Denies any fevers, chills, dysuria.  States he has been tolerating his ADLs without difficulty provost episode.  Physical exam is notable for: - Well-appearing, alert and oriented.  Benign neurologic exam.  Clear heart and lung sounds, no lower extremity edema.  On my initial evaluation, patient is:  -Vital signs stable. Patient afebrile, hemodynamically stable, and non-toxic appearing. -Additional history obtained from EMS  Differentials include cardiac (structural or arrhythmia), CVA, TIA, neoplasm, electrolytes, hypovolemia, orthostasis, vertigo.    On arrival patient blood pressure is in the 60s systolic.  My immediate reassessment blood pressure is slowly increasing.  He is warm and well-perfused cognitively intact.  Applied slight reverse Trendelenburg with persistent increase in blood pressure.  At the end of my physical exam maps now in the 85s with systolics in the high 90s.  Will refrain from IV fluid resuscitation given patient's significant reduced EF.  Believe he likely has had some orthostasis that was exacerbated even further by nitro use.  He does not have any chest pain since this singular episode of this morning.  He does have ICD and Baro stimulator.  EKG very difficult to interpret due to this but is as below.  Appears unchanged from prior.  His workup as below is very reassuring.  His blood pressure improved with time I  believe mostly driven by nitroglycerin .  His symptoms most consistent with vertigo versus orthostasis.  He was able to stand without difficulty.  I do not believe he requires further emergent intervention thus discharged in stable condition with strict return precautions.  Interpretations, interventions, and the patient's course of care are documented below.    Clinical Course as of 11/12/23 2015  Fri Nov 12, 2023  1704 Lactic Acid, Venous: 0.6 [RC]  1739 Hemoglobin(!): 10.5 1 point drop in 7 months.  Denies melanotic  or dark stools [RC]  1751 Troponin I (High Sensitivity): 7 [RC]  1959 Troponin I (High Sensitivity): 7 [RC]  1959 EKG 12-Lead Significant artifact severely limiting EKG however grossly unchanged from prior [RC]  1959 DG Chest 2 View No acute finding [RC]    Clinical Course User Index [RC] [name], MD      Disposition:  I discussed the plan for discharge with the patient and/or their surrogate at bedside prior to discharge and they were in agreement with the plan and verbalized understanding of the return precautions provided. All questions answered to the best of my ability. Ultimately, the patient was discharged in stable condition with stable vital signs. I am reassured that they are capable of close follow up and good social support at home.   Clinical Impression:  1. Dizziness   2. Near syncope     Rx / DC Orders ED Discharge Orders     None       The plan for this patient was discussed with Dr. Dean, who voiced agreement and who  oversaw evaluation and treatment of this patient.   Clinical Complexity A medically appropriate history, review of systems, and physical exam was performed.  My independent interpretations of EKG, labs, and radiology are documented in the ED course above.   If decision rules were used in this patient's evaluation, they are listed below.   Click here for ABCD2, HEART and other calculatorsREFRESH Note before  signing   Patient's presentation is most consistent with acute presentation with potential threat to life or bodily function.  Medical Decision Making Amount and/or Complexity of Data Reviewed Labs: ordered. Decision-making details documented in ED Course. Radiology: ordered. Decision-making details documented in ED Course. ECG/medicine tests:  Decision-making details documented in ED Course.    HPI/ROS      See MDM section for pertinent HPI and ROS. A complete ROS was performed with pertinent positives/negatives noted above.   Past Medical History:  Diagnosis Date   AICD (automatic cardioverter/defibrillator) present    [facility] [employer] MOMENTUM EL ICD D121/ [phone]   Aneurysm [facility])    a. Aneurysmal infrarenal aorta up to 33 mm on CT 10/2014, recommended f/u due 10/2017   Anginal pain (HCC)    Anxiety    Arthritis    right foot   Basal cell carcinoma of nose    S/P MOHS   Biliary acute pancreatitis    CAD (coronary artery disease)    a. s/p MI in 1994 with PCI to LAD at that time b. cath 10/2012 demonstrated EF 30%, inferior akinesis with mild hypokinesis of all walls, patent LAD and RCA stents; ostial PDA with 80-90% obstruction with medical therapy recommended    Chronic systolic CHF (congestive heart failure) (HCC)    EF 30 to 35 % as of 09/2014.    CKD (chronic kidney disease), stage III (HCC)    Complication of anesthesia 10/2014   had to have defibrillator w/ERCP- CODED after having gallstones removed   COPD (chronic obstructive pulmonary disease) (HCC)    a. followed by pulmonary, COPD GOLD stage II   Depression    Diverticulosis of colon 07/2014   noted on CT   Dyspnea    with exertion   GERD (gastroesophageal reflux disease)    Hiatal hernia    large   History of kidney stones    passed stone   Hyperglycemia 10/2012   Hyperlipidemia    Hypertension    Myocardial infarction [facility]) 1994; 2011   Pneumonia 1946; 2015   Prostate enlargement 07/2014   observed  on CT   Tobacco abuse    Ventricular tachycardia (HCC)    a. 08/2009 s/p BSX E110 Teligen 100 AICD, ser#: [id number];  b. 08/2008 VT req ATP - detection reprogrammed from 160 to 150. c. EPS and VT ablation by Dr. Waddell 12/21/2014    Past Surgical History:  Procedure Laterality Date   BIOPSY  12/21/2017   Procedure: BIOPSY;  Surgeon: Abran Norleen SAILOR, MD;  Location: [facility] ENDOSCOPY;  Service: Endoscopy;;   CATARACT EXTRACTION W/ INTRAOCULAR LENS  IMPLANT, BILATERAL Bilateral ~ 2011   COLONOSCOPY     COLONOSCOPY WITH PROPOFOL  N/A 12/21/2017   Procedure: COLONOSCOPY WITH PROPOFOL ;  Surgeon: Abran Norleen SAILOR, MD;  Location: [facility] ENDOSCOPY;  Service: Endoscopy;  Laterality: N/A;   ELECTROPHYSIOLOGIC STUDY N/A 12/21/2014   Procedure: V Tach Ablation;  Surgeon: Danelle LELON Waddell, MD;  Location: [facility] INVASIVE CV LAB;  Service: Cardiovascular;  Laterality: N/A;   ERCP N/A 11/16/2014   Procedure: ENDOSCOPIC RETROGRADE CHOLANGIOPANCREATOGRAPHY (ERCP);  Surgeon:  Lamar JONETTA Aho, MD;  Location: [facility] ENDOSCOPY;  Service: Endoscopy;  Laterality: N/A;   ESOPHAGOGASTRODUODENOSCOPY (EGD) WITH PROPOFOL  N/A 12/21/2017   Procedure: ESOPHAGOGASTRODUODENOSCOPY (EGD) WITH PROPOFOL ;  Surgeon: Abran Norleen SAILOR, MD;  Location: [facility] ENDOSCOPY;  Service: Endoscopy;  Laterality: N/A;   EYE SURGERY     FOOT SURGERY Left 2005   fixed bone that stuck out in my ankle area   HEMORRHOID BANDING     ICD GENERATOR CHANGEOUT N/A 04/20/2022   Procedure: ICD GENERATOR CHANGEOUT;  Surgeon: Waddell Danelle ORN, MD;  [facility] INVASIVE CV LAB;  Service: Cardiovascular;  Laterality: N/A;   IMPLANTABLE CARDIOVERTER DEFIBRILLATOR IMPLANT  09/06/09   BSX dual chamber ICD implanted in [place]  for cardiac arrest 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   INGUINAL HERNIA REPAIR Right ~ 1995   INGUINAL HERNIA REPAIR Left 04/10/2022   Procedure: HERNIA REPAIR INGUINAL ADULT;  Surgeon: Ebbie Cough, MD;  Location: [facility] OR;  Service: General;  Laterality: Left;   LEFT HEART CATH AND  CORONARY ANGIOGRAPHY N/A 03/19/2022   Procedure: LEFT HEART CATH AND CORONARY ANGIOGRAPHY;  Surgeon: Swaziland, Peter M, MD;  Location: [facility] INVASIVE CV LAB;  Service: Cardiovascular;  Laterality: N/A;   LEFT HEART CATHETERIZATION WITH CORONARY ANGIOGRAM N/A 11/25/2012   demonstrated EF 30%, inferior akinesis with mild hypokinesis of all walls, patent LAD and RCA stents; ostial PDA with 80-90% obstruction with medical therapy recommended   MOHS SURGERY  2008   nose, skin graft   POLYPECTOMY  12/21/2017   Procedure: POLYPECTOMY;  Surgeon: Abran Norleen SAILOR, MD;  Location: [facility] END[facility];  Service: Endoscopy;;   RETINAL DETACHMENT SURGERY Right 2013   TENOLYSIS Right 12/21/2013   Procedure: TENOLYSIS FLEXOR CARPI RADIALIS ,DEBRIDEMENT RIGHT JOINT WRIST,DEBRIDEMENT SCAPHOTRAPEZIAL TRAPEZOID, REPAIR OF EXTENSOR HOOD;  Surgeon: Arley Curia, MD;  Location: [facility];  Service: Orthopedics;  Laterality: Right;   TOE SURGERY Right 09/2019   3rd toe/hammer toe   V-TACH ABLATION  12/21/2014   VIDEO BRONCHOSCOPY Bilateral 01/09/2016   Procedure: VIDEO BRONCHOSCOPY WITHOUT FLUORO;  Surgeon: Vicenta KATHEE Lennert, MD;  Location: [facility] ENDOS[facility];  Service: Cardiopulmonary;  Laterality: Bilateral;      Physical Exam   Vitals:   11/12/23 1730 11/12/23 1745 11/12/23 1830 11/12/23 1900  BP: (!) 95/49 91/66 116/70 103/68  Pulse: (!) 54 (!) 57 (!) 51 (!) 50  Resp: 20 (!) 21 19 19   Temp:      TempSrc:      SpO2: 100% 99% 100% 98%  Weight:      Height:        Physical Exam Vitals and nursing note reviewed.  Constitutional:      General: He is not in acute distress.    Appearance: He is well-developed.  HENT:     Head: Normocephalic and atraumatic.  Eyes:     Conjunctiva/sclera: Conjunctivae normal.  Cardiovascular:     Rate and Rhythm: Normal rate and regular rhythm.     Heart sounds: No murmur heard. Pulmonary:     Effort: Pulmonary effort is normal. No respiratory distress.     Breath sounds:  Normal breath sounds.  Abdominal:     Palpations: Abdomen is soft.     Tenderness: There is no abdominal tenderness.  Musculoskeletal:        General: No swelling.     Cervical back: Neck supple.     Right lower leg: No edema.     Left lower leg: No edema.  Skin:  General: Skin is warm and dry.     Capillary Refill: Capillary refill takes less than 2 seconds.  Neurological:     General: No focal deficit present.     Mental Status: He is alert and oriented to person, place, and time. Mental status is at baseline.     GCS: GCS eye subscore is 4. GCS verbal subscore is 5. GCS motor subscore is 6.     Cranial Nerves: Cranial nerves 2-12 are intact.     Sensory: Sensation is intact.     Motor: Motor function is intact.     Coordination: Coordination is intact. Finger-Nose-Finger Test normal.  Psychiatric:        Mood and Affect: Mood normal.      Procedures   If procedures were preformed on this patient, they are listed below:  Procedures   @BBSIG @   Please note that this documentation was produced with the assistance of voice-to-text technology and may contain errors.    [name], MD 11/12/23 2017    Dean Clarity, MD 11/12/23 [phone]

## 2023-11-12 NOTE — Discharge Instructions (Signed)
 You were seen today for dizziness. While you were here we monitored your vitals, preformed a physical exam, and labs. These were all reassuring and there is no indication for any further testing or intervention in the emergency department at this time.   Things to do:  - Follow up with your primary care provider within the next 1-2 weeks - Please refrain from taking nitroglycerin  for dizziness.  Only take it if you have crushing chest pain that last more than a few minutes  Return to the emergency department if you have any new or worsening symptoms including difficulty with your balance, room spinning, numbness tingling or weakness in your extremities.  Difficulty finding her words.  Chest pain or shortness of breath., or if you have any other concerns.

## 2023-11-12 NOTE — ED Triage Notes (Signed)
 BIB EMS while at [employer] patient had some chest pain and took a nitroglycerin  and he became dizzy and hypotensive in the 70's  Got 500cc fluid and was still only in the 80s. Patient has a barostem to keep his bp up.

## 2023-11-17 ENCOUNTER — Telehealth: Payer: Self-pay | Admitting: Internal Medicine

## 2023-11-17 NOTE — Telephone Encounter (Signed)
 Spoke to patient he stated he has been having episodes of dizziness.He went to [facility] ED last Thursday.B/P was low.B/P today 114/60 pulse 63.He requested appointment for a EKG and to review his medications.Appointment scheduled with Daphne Barrack PA 8/21 at 8:25 am.Advised to bring B/P readings and all medications.

## 2023-11-17 NOTE — Telephone Encounter (Addendum)
 Pt c/o medication issue:  1. Name of Medication: carvedilol  (COREG ) 3.125 MG tablet   2. How are you currently taking this medication (dosage and times per day)? Take 1 tablet (3.125 mg total) by mouth 2 (two) times daily.   3. Are you having a reaction (difficulty breathing--STAT)?   4. What is your medication issue? Patient called stating he wants to speak to someone about this medication.  He wanted to know if there is a substitute for it.  He said he would give more information when nurse calls him back, and wouldn't give me more information.

## 2023-11-18 ENCOUNTER — Encounter: Payer: Self-pay | Admitting: Pulmonary Disease

## 2023-11-18 ENCOUNTER — Ambulatory Visit: Attending: Cardiology | Admitting: Pulmonary Disease

## 2023-11-18 VITALS — BP 90/60 | HR 65 | Ht 71.0 in | Wt 150.0 lb

## 2023-11-18 DIAGNOSIS — I480 Paroxysmal atrial fibrillation: Secondary | ICD-10-CM

## 2023-11-18 DIAGNOSIS — R42 Dizziness and giddiness: Secondary | ICD-10-CM | POA: Diagnosis not present

## 2023-11-18 DIAGNOSIS — D6869 Other thrombophilia: Secondary | ICD-10-CM

## 2023-11-18 DIAGNOSIS — Z95810 Presence of automatic (implantable) cardiac defibrillator: Secondary | ICD-10-CM | POA: Diagnosis not present

## 2023-11-18 DIAGNOSIS — I5022 Chronic systolic (congestive) heart failure: Secondary | ICD-10-CM

## 2023-11-18 DIAGNOSIS — I472 Ventricular tachycardia, unspecified: Secondary | ICD-10-CM | POA: Diagnosis not present

## 2023-11-18 LAB — CUP PACEART INCLINIC DEVICE CHECK
Date Time Interrogation Session: 20250821120119
Implantable Lead Connection Status: 753985
Implantable Lead Connection Status: 753985
Implantable Lead Implant Date: 20110610
Implantable Lead Implant Date: 20110610
Implantable Lead Location: 753859
Implantable Lead Location: 753860
Implantable Lead Model: 185
Implantable Lead Model: 4135
Implantable Lead Serial Number: 28681386
Implantable Lead Serial Number: 339643
Implantable Pulse Generator Implant Date: 20240122
Pulse Gen Serial Number: 238282

## 2023-11-18 MED ORDER — LOSARTAN POTASSIUM 25 MG PO TABS: 25.0000 mg | ORAL_TABLET | Freq: Every day | ORAL | 1 refills | Status: DC

## 2023-11-18 NOTE — Progress Notes (Signed)
 Electrophysiology Office Note:   Date:  11/18/2023  ID:  Steven, Guzman 02-10-1941, MRN [id number]  Primary Cardiologist: Jerel Balding, MD Primary Heart Failure: None Electrophysiologist: Danelle Birmingham, MD       History of Present Illness:   Steven Guzman is a 83 y.o. male with h/o CAD, HFrEF, VT s/p ICD, PAF, HTN, HLD, COPD and GERD  seen today for an acute visit due to dizziness & low BP readings.   Since last being seen in our clinic the patient reports he was recently seen in the ER for low BP and dizziness. He reports he was at home and felt lightheaded and was afraid he would have chest pain and took a NTG.  He then became more lightheaded and dizzy prompting ER evaluation. ER work up was negative > troponin flat, normal electrolytes, negative EKG / CXR.  He was discharged home.  He had not been sick.  He notes he has lost a significant amt of weight in the last year (from 206-150 lbs). He is the primary caretaker of his wife.  He frequently has shortness of breath.    He denies chest pain, palpitations, dyspnea, PND, orthopnea, nausea, vomiting, dizziness, syncope, edema, weight gain, or early satiety.   Review of systems complete and found to be negative unless listed in HPI.    EP Information / Studies Reviewed:    EKG is not ordered today. EKG from 11/12/23 reviewed which showed Barostim artifact, SR with ventricular rate in 60's      ICD Interrogation-  reviewed in detail today,  See PACEART report.  Device History: [employer] seismologist ICD implanted 08/2009 for HFrEF Generator Change > 03/2022  History of appropriate therapy: Yes, 05/2023 ATP for VT. History of AAD therapy: Yes; currently on amiodarone  + mexiletine    Barostim (standard) > implanted 12/17/22 for HFrEF  Risk Assessment/Calculations:    CHA2DS2-VASc Score = 5   This indicates a 7.2% annual risk of stroke. The patient's score is based upon: CHF History: 1 HTN History: 1 Diabetes  History: 0 Stroke History: 0 Vascular Disease History: 1 Age Score: 2 Gender Score: 0             Physical Exam:   VS:  BP 90/60   Pulse 65   Ht 5' 11 (1.803 m)   Wt 150 lb (68 kg)   SpO2 100%   BMI 20.92 kg/m    Wt Readings from Last 3 Encounters:  11/18/23 150 lb (68 kg)  11/12/23 159 lb (72.1 kg)  07/19/23 159 lb (72.1 kg)     GEN: Well nourished, well developed in no acute distress NECK: No JVD; No carotid bruits CARDIAC: Regular rate and rhythm, no murmurs, rubs, gallops RESPIRATORY:  Clear to auscultation without rales, wheezing or rhonchi  ABDOMEN: Soft, non-tender, non-distended EXTREMITIES:  No edema; No deformity   ASSESSMENT AND PLAN:    Dizziness / Low BP -suspect driven by NTG superimposed on pre-existing low BP -encouraged adequate PO intake > significant weight loss  -change Entresto  to Losartan  25 mg daily  -will need follow up with Cardiology in ~ 2weeks to eval BP -pt did have AT/ST the day preceding the ER visit but none on the day to correlate with symptoms  Chronic Systolic Dysfunction s/p [employer] dual chamber ICD and Barostim  VT  NYHA II-III symptoms Barostim programmed at 8.0 for chronic settings > EF post Barostim 30-35% -euvolemic on exam / by device  -Stable  on an appropriate medical regimen -Normal ICD function -See Pace Art report -No changes today -amiodarone  200 mg daily  -mexiletine 200 mg BID   Paroxysmal Atrial Fibrillation  Atrial Tachycardia  -0% burden on device -OAC for stroke prophylaxis  -amiodarone  200 mg daily   Secondary Hypercoagulable State  -continue Eliquis  5mg  BID, dose reviewed and appropriate by wt / Cr  CAD  -no anginal symptoms   -reviewed when he should take NTG    Weight Loss  Pulmonary Cachexia  -discussed addition of glucerna BID  -he has difficulty finishing a meal due to dyspnea > most likely due to baseline pulmonary issues > no volume on device to suggest overload     Disposition:   Follow up with EP APP [name]) in September & Cardiology in next 2 weeks for BP review      Signed, Daphne Barrack, NP-C, AGACNP-BC [facility] - Electrophysiology  11/18/2023, 8:53 AM

## 2023-11-18 NOTE — Patient Instructions (Signed)
 Medication Instructions:  1.Stop entresto  2.Start losartan  25 mg daily *If you need a refill on your cardiac medications before your next appointment, please call your pharmacy*  Lab Work: None ordered If you have labs (blood work) drawn today and your tests are completely normal, you will receive your results only by: MyChart Message (if you have MyChart) OR A paper copy in the mail If you have any lab test that is abnormal or we need to change your treatment, we will call you to review the results.  Follow-Up: At [facility], you and your health needs are our priority.  As part of our continuing mission to provide you with exceptional heart care, our providers are all part of one team.  This team includes your primary Cardiologist (physician) and Advanced Practice Providers or APPs (Physician Assistants and Nurse Practitioners) who all work together to provide you with the care you need, when you need it.  Your next appointment:   Within the next few weeks for blood pressure follow up  Provider:   Lum Louis, NP

## 2023-12-02 ENCOUNTER — Encounter: Payer: Self-pay | Admitting: Student

## 2023-12-15 ENCOUNTER — Telehealth: Payer: Self-pay | Admitting: Cardiovascular Disease

## 2023-12-15 ENCOUNTER — Other Ambulatory Visit: Payer: Self-pay | Admitting: General Practice

## 2023-12-15 MED ORDER — NITROGLYCERIN 0.4 MG SL SUBL: 0.4000 mg | SUBLINGUAL_TABLET | SUBLINGUAL | 3 refills | Status: AC | PRN

## 2023-12-15 NOTE — Telephone Encounter (Signed)
*  STAT* If patient is at the pharmacy, call can be transferred to refill team.   1. Which medications need to be refilled? (please list name of each medication and dose if known) nitroGLYCERIN  (NITROSTAT ) 0.4 MG SL tablet    2. Would you like to learn more about the convenience, safety, & potential cost savings by using the [facility]? No    3. Are you open to using the [facility] (Type [facility]. NO   4. Which pharmacy/location (including street and city if local pharmacy) is medication to be sent to?[facility] [id number] - [place], [place] - [address]     5. Do they need a 30 day or 90 day supply? 30 day

## 2023-12-15 NOTE — Telephone Encounter (Signed)
 Medication refill request completed. Medication sent to [facility] per pt's request.

## 2023-12-17 ENCOUNTER — Ambulatory Visit: Attending: Student | Admitting: Student

## 2023-12-20 ENCOUNTER — Ambulatory Visit: Attending: Emergency Medicine | Admitting: Emergency Medicine

## 2023-12-20 NOTE — Progress Notes (Deleted)
 Cardiology Office Note:    Date:  12/20/2023  ID:  Steven, Guzman 1940-06-14, MRN [id number] PCP: Esmeralda Morton Steven Guzman   HeartCare Providers Cardiologist:  Jerel Balding, MD Electrophysiologist:  Danelle Birmingham, MD { Click to update primary MD,subspecialty MD or APP then REFRESH:1}    {Click to Open Review  :1}   Patient Profile:       Chief Complaint: *** History of Present Illness:  Steven Guzman is a 83 y.o. male with visit-pertinent history of CAD s/p inferior MI complicated by cardiac arrest in 1995 s/p stenting to RCA, PCI with stenting to LAD in 2011, NSTEMI in 03/18/2022 due to occlusion of OM 3 treated medically, ischemic cardiomyopathy, chronic HFrEF with an EF of 30% s/p Barostim in 11/2022, VT s/p [employer] ICD in 2010 and ablation in 2016 on amiodarone , PAF on Eliquis , AAA, hypertension, hyperlipidemia, COPD, GERD, hiatal hernia.   He does have a long history of CAD with prior interventions to RCA in 1995 and LAD in 2011.  Most recent ischemic evaluation on 02/2022 in the setting of NSTEMI which showed patent stents to RCA and LAD and 100% occlusion of distal OM 3 (culprit lesion) as well as severe 80% stenosis of ostial PDA which was unchanged from 2014.  Medical therapy was recommended at that time.  He has history of ischemic cardiomyopathy/chronic HFrEF/VT, he underwent placement of [employer] ICD in 2010 and had VT ablation 2016 with subsequent ICD change in January 2024.  Most recent echocardiogram in April 2024 showed LVEF of 30% with akinesis of basal inferoseptal, basal to mid inferior, and inferolateral walls and hypokinesis of anterolateral wall, LVH, grade 1 DD, mildly enlarged RV, mild biatrial enlargement, no significant valvular disease.  He has had chronic dyspnea on exertion and underwent placement of right sided Barostim in September 2024.   He was seen in clinic by [name] [name] on 01/19/2023 where he was noted to have some lower  extremity edema he was given a short course of Lasix  and recommended as needed use after.  His BNP was negative.   He was seen in the ED on 04/12/2023 with complaints of chest pain.  The chest pain was left-sided, initially sharp then became dull.  He took a nitroglycerin  at home without any relief.  High-sensitivity troponins unremarkable at 10, 11.  Chest x-ray showed no acute cardiopulmonary findings.   He was seen for follow-up on 04/21/2023.  He had been without any chest pain or exertional angina since ED visit.  Given his episode of chest pain was atypical and musculoskeletal in nature further ischemic evaluation was not pursued at this time.  Seen by Pharm.D. lipid clinic on 04/22/2023.  He was started on Repatha  140 mg every 2 weeks.   CT by EP on 06/11/2023.  It was noted that patient had appropriately treated VT with ATP on 05/07/2023.  He was doing well from a cardiac standpoint.  Echocardiogram was ordered to be completed in May 2025 for 6 months post barostim.  No medication changes were made he was to follow-up with EP in 3 months.  He was last seen by general cardiology 07/19/2023.  He is doing well at the time without cardiac symptoms.  He has a follow-up in 6 months.  He was last seen in clinic on 11/18/2023 by EP.  He reports since last being seen in our clinic he was seen in the ER for low blood pressure and dizziness. He reports he was  at home and felt lightheaded and was afraid he would have chest pain and took a NTG. He then became more lightheaded and dizzy prompting ER evaluation.  His ER evaluation was unremarkable and he was discharged home.  During office visit his blood pressure was noted to be 90/60.  Dizziness was suspected driven by nitroglycerin  superimposed on pre-existing low BP.  His Entresto  was changed to losartan  25 mg daily.  Discussed the use of AI scribe software for clinical note transcription with the patient, who gave verbal consent to proceed.  History of Present  Illness     Review of systems:  Please see the history of present illness. All other systems are reviewed and otherwise negative. ***      Studies Reviewed:        ***  Risk Assessment/Calculations:   {Does this patient have ATRIAL FIBRILLATION?[phone]} No BP recorded.  {Refresh Note OR Click here to enter BP  :1}***        Physical Exam:   VS:  There were no vitals taken for this visit.   Wt Readings from Last 3 Encounters:  11/18/23 150 lb (68 kg)  11/12/23 159 lb (72.1 kg)  07/19/23 159 lb (72.1 kg)    GEN: Well nourished, well developed in no acute distress NECK: No JVD; No carotid bruits CARDIAC: ***RRR, no murmurs, rubs, gallops RESPIRATORY:  Clear to auscultation without rales, wheezing or rhonchi  ABDOMEN: Soft, non-tender, non-distended EXTREMITIES:  No edema; No acute deformity ***      Assessment and Plan:    Assessment and Plan Assessment & Plan      {Are you ordering a CV Procedure (e.g. stress test, cath, DCCV, TEE, etc)?   Press F2        :[id number]}  Dispo:  No follow-ups on file.  Signed, [name], NP

## 2023-12-28 ENCOUNTER — Encounter (HOSPITAL_BASED_OUTPATIENT_CLINIC_OR_DEPARTMENT_OTHER): Payer: Self-pay | Admitting: Emergency Medicine

## 2023-12-28 ENCOUNTER — Emergency Department (HOSPITAL_BASED_OUTPATIENT_CLINIC_OR_DEPARTMENT_OTHER)

## 2023-12-28 ENCOUNTER — Emergency Department (HOSPITAL_BASED_OUTPATIENT_CLINIC_OR_DEPARTMENT_OTHER)
Admission: EM | Admit: 2023-12-28 | Discharge: 2023-12-28 | Disposition: A | Discharge status: Home / Self Care | Attending: Emergency Medicine | Admitting: Emergency Medicine

## 2023-12-28 ENCOUNTER — Other Ambulatory Visit: Payer: Self-pay

## 2023-12-28 DIAGNOSIS — I129 Hypertensive chronic kidney disease with stage 1 through stage 4 chronic kidney disease, or unspecified chronic kidney disease: Secondary | ICD-10-CM | POA: Insufficient documentation

## 2023-12-28 DIAGNOSIS — L03115 Cellulitis of right lower limb: Secondary | ICD-10-CM | POA: Diagnosis not present

## 2023-12-28 DIAGNOSIS — N189 Chronic kidney disease, unspecified: Secondary | ICD-10-CM | POA: Insufficient documentation

## 2023-12-28 DIAGNOSIS — I2510 Atherosclerotic heart disease of native coronary artery without angina pectoris: Secondary | ICD-10-CM | POA: Diagnosis not present

## 2023-12-28 DIAGNOSIS — Z7901 Long term (current) use of anticoagulants: Secondary | ICD-10-CM | POA: Diagnosis not present

## 2023-12-28 DIAGNOSIS — Z79899 Other long term (current) drug therapy: Secondary | ICD-10-CM | POA: Diagnosis not present

## 2023-12-28 DIAGNOSIS — Z7982 Long term (current) use of aspirin: Secondary | ICD-10-CM | POA: Diagnosis not present

## 2023-12-28 DIAGNOSIS — R2241 Localized swelling, mass and lump, right lower limb: Secondary | ICD-10-CM

## 2023-12-28 DIAGNOSIS — M7989 Other specified soft tissue disorders: Secondary | ICD-10-CM | POA: Diagnosis present

## 2023-12-28 MED ORDER — DOXYCYCLINE HYCLATE 100 MG PO TABS
100.0000 mg | ORAL_TABLET | Freq: Once | ORAL | Status: AC
  Administered 2023-12-28: 100 mg via ORAL
  Filled 2023-12-28: qty 1

## 2023-12-28 MED ORDER — DOXYCYCLINE HYCLATE 100 MG PO CAPS: 100.0000 mg | ORAL_CAPSULE | Freq: Two times a day (BID) | ORAL | 0 refills | Status: DC

## 2023-12-28 NOTE — Discharge Instructions (Signed)
 Please read and follow all provided instructions.  Your diagnoses today include:  1. Localized swelling of right lower leg   2. Cellulitis of right lower leg     Tests performed today include: X-ray of the foot shows arthritis in the foot, especially the big toe Ultrasound of the right lower extremity does not show any signs of blood clot Vital signs. See below for your results today.   Medications prescribed:  Doxycycline  - antibiotic  You have been prescribed an antibiotic medicine: take the entire course of medicine even if you are feeling better. Stopping early can cause the antibiotic not to work.  Take any prescribed medications only as directed.  Home care instructions:  Follow any educational materials contained in this packet.  Follow-up instructions: Please follow-up with your primary care provider in the next 3 days for further evaluation of your symptoms.   Return instructions:  Please return to the Emergency Department if you experience worsening symptoms.  Return with worsening redness, swelling, vomiting, streaking redness up the leg, fever Please return if you have any other emergent concerns.  Additional Information:  Your vital signs today were: BP 96/64 (BP Location: Left Arm)   Pulse 62   Temp 98 F (36.7 C) (Oral)   Resp 15   Ht 5' 11 (1.803 m)   Wt 68 kg   SpO2 95%   BMI 20.92 kg/m  If your blood pressure (BP) was elevated above 135/85 this visit, please have this repeated by your doctor within one month. --------------

## 2023-12-28 NOTE — ED Triage Notes (Signed)
 Pt c/o R foot swelling x 1 week. Reports swelling in L leg as well, but that has resolved. Also c/o soreness to top of R foot, ankle.   Denies known injury.

## 2023-12-28 NOTE — ED Provider Notes (Signed)
 [facility] EMERGENCY DEPARTMENT AT [facility] [place] Provider Note   CSN: [id number] Arrival date & time: 12/28/23  1303     Patient presents with: Foot Pain   Steven Guzman is a 83 y.o. male.    Patient with history of hypertension, hyperlipidemia, coronary artery disease on Eliquis , AICD, chronic kidney disease, known infrarenal AAA --presents to the emergency department for evaluation of right lower extremity swelling.  Patient states that both of his legs have been swollen recently, however left lower extremity has resolved.  Patient has several small wounds on his legs that have been bleeding at times, reports that he has been scratching at the areas.  He also has pain along the dorsum of his foot.  He denies falls, injuries.  He does have swelling in this area on the top of his foot.  No chest pain or shortness of breath.  No fevers.       Prior to Admission medications   Medication Sig Start Date End Date Taking? Authorizing Provider  acetaminophen  (TYLENOL ) 500 MG tablet Take 1,000 mg by mouth every 6 (six) hours as needed for moderate pain.    [provider]  albuterol  (PROVENTIL ) (2.5 MG/3ML) 0.083% nebulizer solution USE 1 VIAL VIA NEBULIZER EVERY 6 HOURS AS NEEDED FOR WHEEZING OR SHORTNESS OF BREATH 07/18/19   Olalere, Jennet A, MD  amiodarone  (PACERONE ) 200 MG tablet Take 1 tablet (200 mg total) by mouth daily. \ 08/27/23   Lesia Ozell Barter, PA-C  apixaban  (ELIQUIS ) 5 MG TABS tablet Take 1 tablet (5 mg total) by mouth 2 (two) times daily. 12/18/22   Bethanie Cough, PA-C  Artificial Tear Solution (SOOTHE XP OP) Place 2 drops into both eyes daily as needed (dry eyes).    [provider]  Ascorbic Acid (VITAMIN C) 1000 MG tablet Take 1,000 mg by mouth daily.    [provider]  aspirin  EC 81 MG tablet Take 1 tablet (81 mg total) by mouth daily. Swallow whole. 03/21/22   Meng, Hao, PA  Biotin 5000 MCG CAPS Take 5,000 mcg by mouth  daily. Patient not taking: Reported on 11/18/2023    [provider]  Calcium  Citrate-Vitamin D  (CALCIUM  + D PO) Take 1 tablet by mouth daily.    [provider]  carvedilol  (COREG ) 3.125 MG tablet Take 1 tablet (3.125 mg total) by mouth 2 (two) times daily. 04/26/23   Rana Lum CROME, NP  cetirizine (ZYRTEC) 10 MG tablet Take 10 mg by mouth daily.    [provider]  Cyanocobalamin  (B12 LIQUID HEALTH BOOSTER PO) Take 5,000 mcg by mouth daily.    [provider]  Docusate Sodium  (DSS) 100 MG CAPS Take 2 capsules by mouth daily in the afternoon. 02/10/22   [provider]  empagliflozin  (JARDIANCE ) 10 MG TABS tablet Take 1 tablet (10 mg total) by mouth daily. 04/19/23   Croitoru, Mihai, MD  Evolocumab  (REPATHA  SURECLICK) 140 MG/ML SOAJ Inject 140 mg into the skin every 14 (fourteen) days. 04/23/23   Croitoru, Mihai, MD  fluticasone  (FLONASE ) 50 MCG/ACT nasal spray Place 2 sprays into both nostrils daily. 02/13/21   Fargo, Amy E, NP  guaiFENesin  (MUCINEX ) 600 MG 12 hr tablet Take 1 tablet by mouth 2 (two) times daily.    [provider]  Ipratropium-Albuterol  (COMBIVENT  RESPIMAT) 20-100 MCG/ACT AERS respimat Inhale 1 puff into the lungs every 6 (six) hours. Shortness of breath or wheezing Patient not taking: Reported on 11/18/2023 09/18/21   Gil No  E, NP  IRON PO Take 1 tablet by mouth daily.    [provider]  losartan  (COZAAR ) 25 MG tablet Take 1 tablet (25 mg total) by mouth daily. 11/18/23   Aniceto Daphne CROME, NP  Magnesium  200 MG TABS Take 400 mg by mouth daily.    [provider]  melatonin 5 MG TABS Take 10 mg by mouth at bedtime.    [provider]  mexiletine (MEXITIL ) 200 MG capsule Take 1 capsule (200 mg total) by mouth 2 (two) times daily. 12/02/22   Croitoru, Mihai, MD  Multiple Vitamins-Minerals (CENTRUM ADULTS PO) Take 1 tablet by mouth daily.    [provider]  mupirocin  ointment (BACTROBAN ) 2 %  Apply 1 Application topically daily as needed (wound care). 11/04/22   [provider]  nitroGLYCERIN  (NITROSTAT ) 0.4 MG SL tablet Place 1 tablet (0.4 mg total) under the tongue every 5 (five) minutes as needed for chest pain. DISSOLVE 1 TABLET UNDER THE TONGUE EVERY 5 MINUTES FOR 3 DOSES 12/15/23   Court Dorn PARAS, MD  omeprazole (PRILOSEC) 20 MG capsule Take 20 mg by mouth daily.     [provider]  polyethylene glycol powder (GLYCOLAX /MIRALAX ) 17 GM/SCOOP powder Take 1 capful in 8 ounces of fluid each morning Patient taking differently: Take 17 g by mouth daily as needed for moderate constipation. 03/08/22   Wehner, Abigal, MD  potassium chloride  (KLOR-CON ) 10 MEQ tablet Take 1 tablet (10 mEq total) by mouth daily. Take as needed only when you take your lasix  01/22/23 11/18/23  Goodrich, Callie E, PA-C  pregabalin (LYRICA) 25 MG capsule Take by mouth in the morning and at bedtime. 03/08/23   [provider]  Respiratory Therapy Supplies (FLUTTER) DEVI Use as directed 03/16/17   Alaine Vicenta NOVAK, MD  Spacer/Aero-Holding Chambers (OPTICHAMBER DIAMOND ) MISC optichamber [facility] 09/12/19   Olalere, Jennet LABOR, MD  spironolactone  (ALDACTONE ) 25 MG tablet Take 0.5 tablets (12.5 mg total) by mouth daily. 11/02/22   Emelia Josefa HERO, NP  STIOLTO RESPIMAT 2.5-2.5 MCG/ACT AERS Inhale 2 puffs into the lungs daily. 09/22/21   [provider]  tamsulosin  (FLOMAX ) 0.4 MG CAPS capsule TAKE ONE CAPSULE BY MOUTH DAILY AFTER SUPPER 06/30/21   Gil No E, NP    Allergies: Sulfa antibiotics and Cephalexin     Review of Systems  Updated Vital Signs BP 96/64 (BP Location: Left Arm)   Pulse 62   Temp 98 F (36.7 C) (Oral)   Resp 15   Ht 5' 11 (1.803 m)   Wt 68 kg   SpO2 95%   BMI 20.92 kg/m   Physical Exam Vitals and nursing note reviewed.  Constitutional:      General: He is not in acute distress.    Appearance: He is well-developed.  HENT:     Head: Normocephalic and  atraumatic.  Eyes:     General:        Right eye: No discharge.        Left eye: No discharge.     Conjunctiva/sclera: Conjunctivae normal.  Cardiovascular:     Rate and Rhythm: Normal rate.  Pulmonary:     Effort: Pulmonary effort is normal.  Abdominal:     Palpations: Abdomen is soft.     Tenderness: There is no abdominal tenderness.  Musculoskeletal:     Cervical back: Normal range of motion and neck supple.  Skin:    General: Skin is warm and dry.     Comments: Patient with significant  venous stasis changes of both lower extremities.  Patient has several scattered wounds with scabbing.  He patient has several Band-Aids in place over the right lower extremity, in the mid ankle to calf area.  There is dried blood on the bottom of the foot from wound that was bleeding up higher on the leg earlier.  Patient has 1-2+ edema of the ankle and the top of the foot.  There is minimal warmth and erythema.  Neurological:     Mental Status: He is alert.     (all labs ordered are listed, but only abnormal results are displayed) Labs Reviewed - No data to display  EKG: None  Radiology: US  Venous Img Lower Right (DVT Study) Result Date: 12/28/2023 CLINICAL DATA:  RIGHT foot swelling EXAM: RIGHT LOWER EXTREMITY VENOUS DOPPLER ULTRASOUND TECHNIQUE: Gray-scale sonography with compression, as well as color and duplex ultrasound, were performed to evaluate the deep venous system(s) from the level of the common femoral vein through the popliteal and proximal calf veins. COMPARISON:  None available FINDINGS: VENOUS Normal compressibility of the common femoral, superficial femoral, and popliteal veins, as well as the visualized calf veins. Visualized portions of profunda femoral vein and great saphenous vein unremarkable. No filling defects to suggest DVT on grayscale or color Doppler imaging. Doppler waveforms show normal direction of venous flow, normal respiratory plasticity and response to  augmentation. Limited views of the contralateral common femoral vein are unremarkable. OTHER None. Limitations: none IMPRESSION: No right lower extremity DVT. Electronically Signed   By: Aliene Lloyd M.D.   On: 12/28/2023 15:13   DG Foot Complete Right Result Date: 12/28/2023 CLINICAL DATA:  Right foot pain. EXAM: RIGHT FOOT COMPLETE - 3+ VIEW COMPARISON:  None Available. FINDINGS: There is no evidence of fracture or dislocation. Severe degenerative changes seen involving first carpometacarpal joint. Vascular calcifications are noted. IMPRESSION: Severe osteoarthritis of first carpometacarpal joint. No acute abnormality seen. Electronically Signed   By: Lynwood Landy Raddle M.D.   On: 12/28/2023 14:38     Procedures   Medications Ordered in the ED - No data to display  ED Course  Patient seen and examined. History obtained directly from patient.   Labs/EKG: None ordered  Imaging: Ordered x-ray of the right foot and ultrasound of the right lower extremity to rule out DVT.  Medications/Fluids: None ordered  Most recent vital signs reviewed and are as follows: BP 96/64 (BP Location: Left Arm)   Pulse 62   Temp 98 F (36.7 C) (Oral)   Resp 15   Ht 5' 11 (1.803 m)   Wt 68 kg   SpO2 95%   BMI 20.92 kg/m   Initial impression: Right ankle and foot swelling, venous stasis likely contributing, possible element of mild cellulitis given mild redness and warmth and multiple wounds, 1 of which has been recently bleeding, but not currently bleeding.  3:28 PM Reassessment performed. Patient appears stable.  Discussed with and seen by Dr. Yolande.  Imaging personally visualized and interpreted including: X-ray of the foot, agree osteoarthritis, no fracture or dislocation;   Reviewed results of DVT study, negative.  Reviewed pertinent lab work and imaging with patient at bedside. Questions answered.   Most current vital signs reviewed and are as follows: BP 96/64 (BP Location: Left Arm)    Pulse 62   Temp 98 F (36.7 C) (Oral)   Resp 15   Ht 5' 11 (1.803 m)   Wt 68 kg   SpO2 95%   BMI 20.92  kg/m   Plan: Discharge to home.  Will cover for mild cellulitis of the right lower extremity given wounds, mild erythema and swelling.  Prescriptions written for: Doxycycline   Other home care instructions discussed: Elevate extremity to help reduce swelling, wear compression to help reduce swelling.  ED return instructions discussed: Pt urged to return with worsening pain, worsening swelling, expanding area of redness or streaking up extremity, fever, or any other concerns.  Urged to take complete course of antibiotics as prescribed.   Follow-up instructions discussed: Patient encouraged to follow-up with their PCP in 3 days for recheck.                                  Medical Decision Making Amount and/or Complexity of Data Reviewed Radiology: ordered.  Risk Prescription drug management.   Patient presents with focal right lower extremity swelling and right foot pain.  Patient was evaluated for bony abnormality of the foot, shows osteoarthritis, which is likely contributing to the patient's pain.  As far as ankle swelling and erythema, DVT study was negative.  I suspect some mild cellulitis given that patient has several scabbed over wounds on the legs, likely portal of entry.  No signs of sepsis or severe infection at this time.  No fevers.  Patient looks well, nontoxic.  Do not feel that he requires additional workup at this time.  Would follow-up with PCP for recheck.  Patient discussed with and seen by attending physician.     Final diagnoses:  Localized swelling of right lower leg  Cellulitis of right lower leg    ED Discharge Orders          Ordered    doxycycline  (VIBRAMYCIN ) 100 MG capsule  2 times daily        12/28/23 1527               Desiderio Chew, PA-C 12/28/23 1531    Yolande Lamar BROCKS, MD 01/02/24 1257

## 2024-01-06 NOTE — Progress Notes (Signed)
 Remote ICD Transmission

## 2024-01-09 ENCOUNTER — Other Ambulatory Visit: Payer: Self-pay | Admitting: Cardiovascular Disease

## 2024-01-09 DIAGNOSIS — I214 Non-ST elevation (NSTEMI) myocardial infarction: Secondary | ICD-10-CM

## 2024-01-09 DIAGNOSIS — E785 Hyperlipidemia, unspecified: Secondary | ICD-10-CM

## 2024-01-09 DIAGNOSIS — I2510 Atherosclerotic heart disease of native coronary artery without angina pectoris: Secondary | ICD-10-CM

## 2024-01-10 ENCOUNTER — Other Ambulatory Visit: Payer: Self-pay | Admitting: Cardiovascular Disease

## 2024-01-10 ENCOUNTER — Ambulatory Visit: Payer: Medicare Other

## 2024-01-10 DIAGNOSIS — I472 Ventricular tachycardia, unspecified: Secondary | ICD-10-CM

## 2024-01-10 DIAGNOSIS — I480 Paroxysmal atrial fibrillation: Secondary | ICD-10-CM

## 2024-01-11 NOTE — Telephone Encounter (Signed)
 Prescription refill request for Eliquis  received. Indication:afib Last office visit:8/25 Scr:0.80  8/25 Age: 83 Weight:68  kg  Prescription refilled

## 2024-01-13 ENCOUNTER — Ambulatory Visit: Payer: Self-pay | Admitting: Internal Medicine

## 2024-01-13 LAB — CUP PACEART REMOTE DEVICE CHECK
Battery Remaining Longevity: 156 mo
Battery Remaining Percentage: 100 %
Brady Statistic RA Percent Paced: 1 %
Brady Statistic RV Percent Paced: 1 %
Date Time Interrogation Session: 20251013003100
HighPow Impedance: 55 Ohm
Implantable Lead Connection Status: 753985
Implantable Lead Connection Status: 753985
Implantable Lead Implant Date: 20110610
Implantable Lead Implant Date: 20110610
Implantable Lead Location: 753859
Implantable Lead Location: 753860
Implantable Lead Model: 185
Implantable Lead Model: 4135
Implantable Lead Serial Number: 28681386
Implantable Lead Serial Number: 339643
Implantable Pulse Generator Implant Date: 20240122
Lead Channel Impedance Value: 393 Ohm
Lead Channel Impedance Value: 676 Ohm
Lead Channel Pacing Threshold Amplitude: 2.5 V
Lead Channel Pacing Threshold Pulse Width: 0.4 ms
Lead Channel Setting Pacing Amplitude: 2 V
Lead Channel Setting Pacing Amplitude: 2.4 V
Lead Channel Setting Pacing Pulse Width: 0.4 ms
Lead Channel Setting Sensing Sensitivity: 0.5 mV
Pulse Gen Serial Number: 238282

## 2024-01-13 NOTE — Progress Notes (Signed)
 Remote ICD Transmission

## 2024-01-17 ENCOUNTER — Ambulatory Visit: Attending: Student | Admitting: Student

## 2024-01-19 NOTE — Progress Notes (Unsigned)
  Cardiology Office Note:   Date:  01/20/2024  ID:  Cordaryl, Decelles [birth year], MRN [id number]  Primary Cardiologist: Jerel Balding, MD Electrophysiologist: Danelle Birmingham, MD   History of Present Illness:   YI HAUGAN is a 83 y.o. male with h/o CAD, Chronic systolic CHF, VT, PAF, HTN, HLD, COPD and GERD seen today for routine electrophysiology followup.   Since last being seen in our clinic the patient reports doing OK. He has had some increased SOB, but is not currently on diuretic. His wife has been ill, and has subsequently started smoking again due to the stress, now so has he. Drinks large amounts of iced tea daily ~ 2 quarts. He denies chest pain, nausea, vomiting, dizziness, syncope, weight gain, or early satiety.  Mild peripheral edema.  Review of systems complete and found to be negative unless listed in HPI.    EP Information / Studies Reviewed:    EKG is not ordered today. EKG from 11/12/2023 reviewed which showed NSR ~60 bpm, confounded by Barostim noise.   Arrhythmia/Device History Barostim (standard) implanted 12/17/2022 for Chronic systolic CHF [employer] dual chamber ICD implanted 08/2009, gen change 03/2022 for CHF GORE Lead   Barostim Interrogation- Performed personally and reviewed in detail today,  See scanned report  ICD/PPM interrogation - Performed personally and reviewed in detail today.    Physical Exam:   VS:  BP 104/62   Pulse 74   Ht 5' 11 (1.803 m)   Wt 150 lb 9.6 oz (68.3 kg)   SpO2 95%   BMI 21.00 kg/m    Wt Readings from Last 3 Encounters:  01/20/24 150 lb 9.6 oz (68.3 kg)  12/28/23 150 lb (68 kg)  11/18/23 150 lb (68 kg)     GEN: Well nourished, well developed in no acute distress NECK: No JVD; No carotid bruits CARDIAC: Regular rate and rhythm, no murmurs, rubs, gallops RESPIRATORY:  Clear to auscultation without rales, wheezing or rhonchi  ABDOMEN: Soft, non-tender, non-distended EXTREMITIES:  No edema; No deformity    ASSESSMENT AND PLAN:    Chronic systolic CHF s/p Chief Financial Officer and Barostim implantation NYHA III symptoms.   Device programmed at 8.0 for chronic settings  Device impedence stable. Normal device function See scanned report.  He continues to smoke, and is not very active. Drinks 64-120 oz of fluid daily.   Add lasix  40 mg to use as needed. Encouraged him to take for the next 2 days, then as needed for weight gain of 3 lbs overnight or 5 lbs within on week. Keep upcoming gen cards visit.   PAF AT Low burden overall Continue eliquis  5 mg BID for CHA2DS2VASc of at least 5  Continue amiodarone  200 mg daily  VT No recurrence Continue amio as above Continue mexitil  200 mg BID  CAD No s/s of ischemia.      Disposition:   Follow up with EP Team in in 6 months for ongoing barostim monitoring.   Signed, Ozell Prentice Passey, PA-C

## 2024-01-20 ENCOUNTER — Encounter: Payer: Self-pay | Admitting: Student

## 2024-01-20 ENCOUNTER — Ambulatory Visit: Attending: Student | Admitting: Student

## 2024-01-20 VITALS — BP 104/62 | HR 74 | Ht 71.0 in | Wt 150.6 lb

## 2024-01-20 DIAGNOSIS — I480 Paroxysmal atrial fibrillation: Secondary | ICD-10-CM | POA: Diagnosis not present

## 2024-01-20 DIAGNOSIS — I2510 Atherosclerotic heart disease of native coronary artery without angina pectoris: Secondary | ICD-10-CM | POA: Diagnosis not present

## 2024-01-20 DIAGNOSIS — Z95810 Presence of automatic (implantable) cardiac defibrillator: Secondary | ICD-10-CM | POA: Diagnosis not present

## 2024-01-20 DIAGNOSIS — I472 Ventricular tachycardia, unspecified: Secondary | ICD-10-CM

## 2024-01-20 DIAGNOSIS — I5022 Chronic systolic (congestive) heart failure: Secondary | ICD-10-CM | POA: Diagnosis not present

## 2024-01-20 DIAGNOSIS — Z79899 Other long term (current) drug therapy: Secondary | ICD-10-CM

## 2024-01-20 DIAGNOSIS — Z9189 Other specified personal risk factors, not elsewhere classified: Secondary | ICD-10-CM

## 2024-01-20 LAB — CUP PACEART INCLINIC DEVICE CHECK
Date Time Interrogation Session: 20251023113435
HighPow Impedance: 54 Ohm
Implantable Lead Connection Status: 753985
Implantable Lead Connection Status: 753985
Implantable Lead Implant Date: 20110610
Implantable Lead Implant Date: 20110610
Implantable Lead Location: 753859
Implantable Lead Location: 753860
Implantable Lead Model: 185
Implantable Lead Model: 4135
Implantable Lead Serial Number: 28681386
Implantable Lead Serial Number: 339643
Implantable Pulse Generator Implant Date: 20240122
Lead Channel Impedance Value: 418 Ohm
Lead Channel Impedance Value: 701 Ohm
Lead Channel Pacing Threshold Amplitude: 1.2 V
Lead Channel Pacing Threshold Amplitude: 2 V
Lead Channel Pacing Threshold Pulse Width: 0.4 ms
Lead Channel Pacing Threshold Pulse Width: 0.4 ms
Lead Channel Sensing Intrinsic Amplitude: 12.2 mV
Lead Channel Sensing Intrinsic Amplitude: 4.3 mV
Lead Channel Setting Pacing Amplitude: 2 V
Lead Channel Setting Pacing Amplitude: 4 V
Lead Channel Setting Pacing Pulse Width: 0.4 ms
Lead Channel Setting Sensing Sensitivity: 0.5 mV
Pulse Gen Serial Number: 238282

## 2024-01-20 MED ORDER — FUROSEMIDE 40 MG PO TABS: 40.0000 mg | ORAL_TABLET | Freq: Every day | ORAL | 3 refills | Status: DC | PRN

## 2024-01-20 NOTE — Patient Instructions (Signed)
 Medication Instructions:    START TAKING:   FUROSEMIDE  2 MG AS NEEDED  WEIGHT GAIN  3LB OVERNIGGHT AND 5LBS IN A WEEK   *If you need a refill on your cardiac medications before your next appointment, please call your pharmacy*  Lab Work:  PLEASE GO DOWN STAIRS  [facility]  FIRST FLOOR   ( GET OFF ELEVATORS WALK TOWARDS WAITING AREA LAB LOCATED BY PHARMACY):  CMET  TSH AND T4      If you have labs (blood work) drawn today and your tests are completely normal, you will receive your results only by: MyChart Message (if you have MyChart) OR A paper copy in the mail If you have any lab test that is abnormal or we need to change your treatment, we will call you to review the results.   Testing/Procedures: NONE ORDERED  TODAY     Follow-Up:  At [facility], you and your health needs are our priority.  As part of our continuing mission to provide you with exceptional heart care, our providers are all part of one team.  This team includes your primary Cardiologist (physician) and Advanced Practice Providers or APPs (Physician Assistants and Nurse Practitioners) who all work together to provide you with the care you need, when you need it.  Your next appointment:   6 month(s)  Provider:   Ozell Jodie Passey, PA-C    We recommend signing up for the patient portal called MyChart.  Sign up information is provided on this After Visit Summary.  MyChart is used to connect with patients for Virtual Visits (Telemedicine).  Patients are able to view lab/test results, encounter notes, upcoming appointments, etc.  Non-urgent messages can be sent to your provider as well.   To learn more about what you can do with MyChart, go to [email or web address].   Other Instructions

## 2024-01-21 ENCOUNTER — Ambulatory Visit: Payer: Self-pay | Admitting: Student

## 2024-01-21 LAB — COMPREHENSIVE METABOLIC PANEL WITH GFR
ALT: 12 IU/L (ref 0–44)
AST: 18 IU/L (ref 0–40)
Albumin: 4 g/dL (ref 3.7–4.7)
Alkaline Phosphatase: 68 IU/L (ref 48–129)
BUN/Creatinine Ratio: 19 (ref 10–24)
BUN: 13 mg/dL (ref 8–27)
Bilirubin Total: 0.4 mg/dL (ref 0.0–1.2)
CO2: 24 mmol/L (ref 20–29)
Calcium: 9.3 mg/dL (ref 8.6–10.2)
Chloride: 98 mmol/L (ref 96–106)
Creatinine, Ser: 0.7 mg/dL — ABNORMAL LOW (ref 0.76–1.27)
Globulin, Total: 2.5 g/dL (ref 1.5–4.5)
Glucose: 101 mg/dL — ABNORMAL HIGH (ref 70–99)
Potassium: 4.4 mmol/L (ref 3.5–5.2)
Sodium: 137 mmol/L (ref 134–144)
Total Protein: 6.5 g/dL (ref 6.0–8.5)
eGFR: 90 mL/min/1.73 (ref >59)

## 2024-01-21 LAB — TSH: TSH: 1.87 u[IU]/mL (ref 0.450–4.500)

## 2024-01-21 LAB — T4, FREE: Free T4: 1.53 ng/dL (ref 0.82–1.77)

## 2024-01-27 ENCOUNTER — Encounter: Payer: Self-pay | Admitting: *Deleted

## 2024-01-31 ENCOUNTER — Ambulatory Visit: Admitting: Emergency Medicine

## 2024-01-31 NOTE — Progress Notes (Deleted)
 Cardiology Office Note:    Date:  01/31/2024  ID:  Steven Guzman, DOB 05/10/1940, MRN [id number] PCP: Esmeralda Morton Steven Guzman  [facility] Providers Cardiologist:  Jerel Balding, MD Cardiology APP:  Lesia Ozell Barter, PA-C  Electrophysiologist:  Danelle Birmingham, MD { Click to update primary MD,subspecialty MD or APP then REFRESH:1}    {Click to Open Review  :1}   Patient Profile:       Chief Complaint: *** History of Present Illness:  Steven Guzman is a 83 y.o. male with visit-pertinent history of  CAD s/p inferior MI complicated by cardiac arrest in 1995 s/p stenting to RCA, PCI with stenting to LAD in 2011, NSTEMI in 03/18/2022 due to occlusion of OM 3 treated medically, ischemic cardiomyopathy, chronic HFrEF with an EF of 30% s/p Barostim in 11/2022, VT s/p [employer] ICD in 2010 and ablation in 2016 on amiodarone , PAF on Eliquis , AAA, hypertension, hyperlipidemia, COPD, GERD, hiatal hernia.   He does have a long history of CAD with prior interventions to RCA in 1995 and LAD in 2011.  Most recent ischemic evaluation on 02/2022 in the setting of NSTEMI which showed patent stents to RCA and LAD and 100% occlusion of distal OM 3 (culprit lesion) as well as severe 80% stenosis of ostial PDA which was unchanged from 2014.  Medical therapy was recommended at that time.  He has history of ischemic cardiomyopathy/chronic HFrEF/VT, he underwent placement of [employer] ICD in 2010 and had VT ablation 2016 with subsequent ICD change in January 2024.  Most recent echocardiogram in April 2024 showed LVEF of 30% with akinesis of basal inferoseptal, basal to mid inferior, and inferolateral walls and hypokinesis of anterolateral wall, LVH, grade 1 DD, mildly enlarged RV, mild biatrial enlargement, no significant valvular disease.  He has had chronic dyspnea on exertion and underwent placement of right sided Barostim in September 2024.   He was seen in clinic by [facility], [name] on  01/19/2023 where he was noted to have some lower extremity edema he was given a short course of Lasix  and recommended as needed use after.  His BNP was negative.   Seen in the ED on 04/12/2023 with complaints of chest pain.  The chest pain was left-sided, initially sharp then became dull.  He took a nitroglycerin  at home without any relief.  High-sensitivity troponins unremarkable at 10, 11.  Chest x-ray showed no acute cardiopulmonary findings.   He was seen for follow-up on 04/21/2023.  He had been without any chest pain or exertional angina since ED visit.  Given his episode of chest pain was atypical and musculoskeletal in nature further ischemic evaluation was not pursued at this time.  Seen by [facility] lipid clinic on 04/22/2023.  He was started on Repatha  140 mg every 2 weeks.   He was last seen by EP on 06/11/2023.  It was noted that patient had appropriately treated VT with ATP on 05/07/2023.  He was doing well from a cardiac standpoint.   Last seen by general cardiology on 07/19/2023.  He is doing well without acute cardiovascular concerns or complaints.  No medication changes are made.  Last seen by EP on 01/20/2024.  He noted to have some increased shortness of breath.  Reports his wife has been ill and he has subsequently started smoking again.  Drinks 2 quarts of iced tea daily.  He was started on Lasix  40 mg as needed.    Discussed the use of AI scribe software  for clinical note transcription with the patient, who gave verbal consent to proceed.  History of Present Illness     Review of systems:  Please see the history of present illness. All other systems are reviewed and otherwise negative. ***      Studies Reviewed:        ***  Risk Assessment/Calculations:   {Does this patient have ATRIAL FIBRILLATION?[phone]} No BP recorded.  {Refresh Note OR Click here to enter BP  :1}***        Physical Exam:   VS:  There were no vitals taken for this visit.   Wt Readings from  Last 3 Encounters:  01/20/24 150 lb 9.6 oz (68.3 kg)  12/28/23 150 lb (68 kg)  11/18/23 150 lb (68 kg)    GEN: Well nourished, well developed in no acute distress NECK: No JVD; No carotid bruits CARDIAC: ***RRR, no murmurs, rubs, gallops RESPIRATORY:  Clear to auscultation without rales, wheezing or rhonchi  ABDOMEN: Soft, non-tender, non-distended EXTREMITIES:  No edema; No acute deformity ***      Assessment and Plan:  Coronary artery disease Prior interventions to RCA in 1995 and LAD in 2011 Cardiac catheterization in 02/2022 showed patent stents to RCA and LAD and 100% occlusion of distal OM3 and 80% stenosis of ostial PDA. Medical therapy was recommended at that time.  - Today and since last office visit he has been without any anginal symptoms, no indication for further ischemic evaluation at this time - Continue aspirin  81 mg daily, carvedilol  3.125 twice daily, rosuvastatin  20 mg daily, Repatha    Chornic HFrEF / Ischemic cardiomyopathy S/p Barostim implatation 11/2022, s/p ICD in 2010 Most recent echocardiogram 06/2022 showed LVEF of 30% with akinesis of basal inferoseptal, basal to mid inferior, and inferolateral walls and hypokinesis of anterolateral wall - Today he is euvolemic and well compensated on exam.  NYHA II-III symptoms - Denies any symptoms of overt heart failure exacerbation - Notes chronic DOE improved with Barostim - GDMT limited by low normal blood pressure of 96/64 - Continue Carvedilol  3.125 mg twice daily, Jardiance  10 mg daily, Entresto  24/26 mg twice daily, Spironolactone  12.5 mg daily - Encouraged daily weights, <2 L fluid restriction, salt restriction - Repeat echocardiogram s/p Barostim insertion scheduled for 08/11/2023   VT s/p [employer] dual chamber ICD S/p ICD in 2010 with generator change 03/2022. Hx VT ablation in 2016  -No recurrence - Managed by EP - Managed on amiodarone  200 mg daily and mexiletine 200 mg twice daily   Paroxysmal  artrial fibrillation  Quiescent and low burden overall He denies any symptoms concerning for recurrent PAF - Managed by EP - Continue amiodarone  200 mg daily, carvedilol  3.25 mg twice daily - Continue Eliquis  5 mg twice daily, denies bleeding concerns, adequately dosed  - Managed by EP   Hypertension  Blood pressure today 96/64 and under adequate control - Continue current antihypertensive regimen as above   Hyperlipidemia  LDL 52, HDL 55, TG 114, TC 127 on 5/88/7974 LDL under excellent control and LDL under goal less than 55 Prior statin intolerance and self discontinued Crestor  recently due to myalgias - Continue Repatha  140 mg every 2 weeks   Tobacco Use Patient resumed smoking after a period of abstinence of three to four years. Currently smoking half a pack per day. - Encouraged complete cessation    Ascending thoracic aneurysm CT angio chest aorta 04/2021 showed mild dilation of the ascending thoracic aorta with a maximum diameter 4.3 cm.  Mild dilation of proximal descending thoracic aorta with a maximum diameter of 4.0 cm - Repeat CT angio chest aorta today for routine surveillance   Infrarenal abdominal aortic aneurysm CT abdomen 05/2022 shows infrarenal abdominal aortic aneurysm 3.3 cm - Recommend follow-up ultrasound in 3 years for routine surveillance Assessment & Plan      {Are you ordering a CV Procedure (e.g. stress test, cath, DCCV, TEE, etc)?   Press F2        :[id number]}  Dispo:  No follow-ups on file.  Signed, [name], NP

## 2024-02-08 ENCOUNTER — Encounter: Payer: Self-pay | Admitting: *Deleted

## 2024-02-09 ENCOUNTER — Ambulatory Visit: Attending: Emergency Medicine | Admitting: Emergency Medicine

## 2024-02-09 ENCOUNTER — Encounter: Payer: Self-pay | Admitting: Emergency Medicine

## 2024-02-09 VITALS — BP 104/72 | HR 71 | Ht 71.0 in | Wt 145.0 lb

## 2024-02-09 DIAGNOSIS — I7143 Infrarenal abdominal aortic aneurysm, without rupture: Secondary | ICD-10-CM

## 2024-02-09 DIAGNOSIS — I2510 Atherosclerotic heart disease of native coronary artery without angina pectoris: Secondary | ICD-10-CM

## 2024-02-09 DIAGNOSIS — I5022 Chronic systolic (congestive) heart failure: Secondary | ICD-10-CM | POA: Diagnosis not present

## 2024-02-09 DIAGNOSIS — I472 Ventricular tachycardia, unspecified: Secondary | ICD-10-CM

## 2024-02-09 DIAGNOSIS — I480 Paroxysmal atrial fibrillation: Secondary | ICD-10-CM

## 2024-02-09 DIAGNOSIS — I255 Ischemic cardiomyopathy: Secondary | ICD-10-CM

## 2024-02-09 DIAGNOSIS — I7121 Aneurysm of the ascending aorta, without rupture: Secondary | ICD-10-CM

## 2024-02-09 DIAGNOSIS — E782 Mixed hyperlipidemia: Secondary | ICD-10-CM

## 2024-02-09 DIAGNOSIS — I10 Essential (primary) hypertension: Secondary | ICD-10-CM

## 2024-02-09 MED ORDER — LOSARTAN POTASSIUM 25 MG PO TABS: 12.5000 mg | ORAL_TABLET | Freq: Every day | ORAL | 3 refills | Status: DC

## 2024-02-09 NOTE — Patient Instructions (Signed)
 Medication Instructions:  DECREASE YOUR LOSARTAN  TO 12.5 (1/2 TABLET) DAILY. CONTINUE ALL OTHER CURRENT MEDICATION THERAPY.  Lab Work: NONE TO BE DONE TODAY.  Testing/Procedures: NONE  Follow-Up: At [facility], you and your health needs are our priority.  As part of our continuing mission to provide you with exceptional heart care, our providers are all part of one team.  This team includes your primary Cardiologist (physician) and Advanced Practice Providers or APPs (Physician Assistants and Nurse Practitioners) who all work together to provide you with the care you need, when you need it.  Your next appointment:   3 MONTHS  Provider:   Jerel Balding, MD

## 2024-02-09 NOTE — Progress Notes (Signed)
 Cardiology Office Note:    Date:  02/09/2024  ID:  Steven Guzman, Steven Guzman 08/04/1940, MRN [id number] PCP: Esmeralda Morton Steven Guzman  [facility] Providers Cardiologist:  Jerel Balding, MD Cardiology APP:  Lesia Ozell Barter, PA-C  Electrophysiologist:  Danelle Birmingham, MD       Patient Profile:       Chief Complaint: 3-month follow-up History of Present Illness:  Steven Guzman is a 83 y.o. male with visit-pertinent history of CAD s/p inferior MI complicated by cardiac arrest in 1995 s/p stenting to RCA, PCI with stenting to LAD in 2011, NSTEMI in 03/18/2022 due to occlusion of OM 3 treated medically, ischemic cardiomyopathy, chronic HFrEF with an EF of 30% s/p Barostim in 11/2022, VT s/p [employer] ICD in 2010 and ablation in 2016 on amiodarone , PAF on Eliquis , AAA, hypertension, hyperlipidemia, COPD, GERD, hiatal hernia.   He does have a long history of CAD with prior interventions to RCA in 1995 and LAD in 2011.  Most recent ischemic evaluation on 02/2022 in the setting of NSTEMI which showed patent stents to RCA and LAD and 100% occlusion of distal OM 3 (culprit lesion) as well as severe 80% stenosis of ostial PDA which was unchanged from 2014.  Medical therapy was recommended at that time.  He has history of ischemic cardiomyopathy/chronic HFrEF/VT, he underwent placement of [employer] ICD in 2010 and had VT ablation 2016 with subsequent ICD change in January 2024.  Most recent echocardiogram in April 2024 showed LVEF of 30% with akinesis of basal inferoseptal, basal to mid inferior, and inferolateral walls and hypokinesis of anterolateral wall, LVH, grade 1 DD, mildly enlarged RV, mild biatrial enlargement, no significant valvular disease.  He has had chronic dyspnea on exertion and underwent placement of right sided Barostim in September 2024.   He was seen in clinic by Steven Guzman, Steven Guzman on 01/19/2023 where he was noted to have some lower extremity edema he was given a short  course of Lasix  and recommended as needed use after.  His BNP was negative.   Seen in the ED on 04/12/2023 with complaints of chest pain.  The chest pain was left-sided, initially sharp then became dull.  He took a nitroglycerin  at home without any relief.  High-sensitivity troponins unremarkable at 10, 11.  Chest x-ray showed no acute cardiopulmonary findings.   He was seen for follow-up on 04/21/2023.  He had been without any chest pain or exertional angina since ED visit.  Given his episode of chest pain was atypical and musculoskeletal in nature further ischemic evaluation was not pursued at this time.  Seen by Pharm.[facility]. lipid clinic on 04/22/2023.  He was started on Repatha  140 mg every 2 weeks.  Last seen by general cardiology on 07/19/2023.  He is doing well without acute cardiovascular concerns or complaints.  No medication changes are made.  Echocardiogram 08/11/2023 showed LVEF 30 to 35%, RWMA with no significant change compared to prior, RV function normal, RV mildly enlarged, mild MR, mild dilation of ascending aorta measuring 42 mm.  Seen by EP on 11/18/2023 for dizziness.  Blood pressure was low at 90/60.  His Entresto  was changed to losartan  25 mg daily.  Last seen by EP on 01/20/2024.  He noted to have some increased shortness of breath. He was started on Lasix  40 mg as needed.   Discussed the use of AI scribe software for clinical note transcription with the patient, who gave verbal consent to proceed.  History of Present  Illness Steven Guzman is an 83 year old male with heart failure and COPD who presents for a six-month follow-up.  He experiences increased difficulty with balance, requiring a wheelchair for longer distances.  He notes ongoing malaise.  He will experience intermittent dizziness or lightheadedness when going from sitting to standing.  He does deny any presyncope, syncope or falls.  He does not routinely take his blood pressures at home but does note he  occasionally hypotensive reading in the 90s systolic in the morning but does improve throughout the day.  He is adherent to his medication regimen. He has COPD chronic shortness of breath.  He uses a nebulizer with albuterol  twice daily, which is helpful. He smokes about a pack a day, acknowledging it worsens his breathing.  He denies any chest pains, orthopnea, PND, LEE, or weight gain.  He has not had to use his as needed furosemide  as he has actually had some weight loss.   Review of systems:  Please see the history of present illness. All other systems are reviewed and otherwise negative.      Studies Reviewed:        Echocardiogram 08/11/2023  1. Left ventricular ejection fraction, by estimation, is 30 to 35%. The  left ventricle has moderately decreased function. The left ventricle  demonstrates regional wall motion abnormalities (see scoring  diagram/findings for description). The left  ventricular internal cavity size was moderately dilated. Left ventricular  diastolic parameters are indeterminate. There is akinesis of the left  ventricular, basal-mid inferior wall, inferoseptal wall and inferolateral  wall. The average left ventricular  global longitudinal strain is -13.1 %. The global longitudinal strain is  abnormal.   2. Right ventricular systolic function is normal. The right ventricular  size is mildly enlarged.   3. Left atrial size was mildly dilated.   4. Right atrial size was mild to moderately dilated.   5. The mitral valve is normal in structure. Mild mitral valve  regurgitation. No evidence of mitral stenosis.   6. The aortic valve is tricuspid. There is mild calcification of the  aortic valve. Aortic valve regurgitation is trivial. Aortic valve  sclerosis/calcification is present, without any evidence of aortic  stenosis.   7. Aortic dilatation noted. There is mild dilatation of the ascending  aorta, measuring 42 mm.   Carotid ultrasound duplex 11/23/2022 Right  Carotid: Velocities in the right ICA are consistent with a 1-39%  stenosis.   Left Carotid: Velocities in the left ICA are consistent with a 1-39%  stenosis.   Vertebrals: Bilateral vertebral arteries demonstrate antegrade flow.  Subclavians: Normal flow hemodynamics were seen in the left subclavian  artery.  Right subclavian appears monophasic, limited visualization.    Echocardiogram 07/02/2022 1. Left ventricular ejection fraction, by estimation, is 30%. The left  ventricle has moderate to severely decreased function. The left ventricle  demonstrates regional wall motion abnormalities with basal inferoseptal  akinesis, basal to mid inferior  akinesis, inferolateral akinesis, anterolateral hypokinesis. The left  ventricular internal cavity size was mildly to moderately dilated. There  is mild concentric left ventricular hypertrophy. Left ventricular  diastolic parameters are consistent with  Grade I diastolic dysfunction (impaired relaxation).   2. Right ventricular systolic function is normal. The right ventricular  size is mildly enlarged. Tricuspid regurgitation signal is inadequate for  assessing PA pressure.   3. Left atrial size was mildly dilated.   4. Right atrial size was mildly dilated.   5. The mitral valve is  normal in structure. Trivial mitral valve  regurgitation. No evidence of mitral stenosis.   6. The aortic valve is tricuspid. There is moderate calcification of the  aortic valve. Aortic valve regurgitation is trivial. No aortic stenosis is  present.   7. The inferior vena cava is normal in size with greater than 50%  respiratory variability, suggesting right atrial pressure of 3 mmHg.    Cardiac catheterization 03/19/2022 2 vessel obstructive CAD - 100% occlusion of distal OM3 appears to be the culprit. The ostial PDA lesion is unchanged from 2014 Patent stents in the LAD and RCA Mild LV dysfunction with inferior wall motion abnormality Mildly elevated LVEDP 16  mm Hg Diagnostic Dominance: Right   Risk Assessment/Calculations:    CHA2DS2-VASc Score = 5   This indicates a 7.2% annual risk of stroke. The patient's score is based upon: CHF History: 1 HTN History: 1 Diabetes History: 0 Stroke History: 0 Vascular Disease History: 1 Age Score: 2 Gender Score: 0             Physical Exam:   VS:  BP 104/72 (BP Location: Left Arm, Patient Position: Sitting, Cuff Size: Normal)   Pulse 71   Ht 5' 11 (1.803 m)   Wt 145 lb (65.8 kg)   BMI 20.22 kg/m    Wt Readings from Last 3 Encounters:  02/09/24 145 lb (65.8 kg)  01/20/24 150 lb 9.6 oz (68.3 kg)  12/28/23 150 lb (68 kg)    GEN: Well nourished, well developed in no acute distress NECK: No JVD; No carotid bruits CARDIAC: RRR, no murmurs, rubs, gallops RESPIRATORY:  Clear to auscultation without rales, wheezing or rhonchi  ABDOMEN: Soft, non-tender, non-distended EXTREMITIES:  No edema; No acute deformity      Assessment and Plan:  Coronary artery disease Prior interventions to RCA in 1995 and LAD in 2011 Cardiac catheterization in 02/2022 showed patent stents to RCA and LAD and 100% occlusion of distal OM3 and 80% stenosis of ostial PDA. Medical therapy was recommended at that time.  - Today he is stable without chest pains.  Denies prior anginal equivalents.  No symptoms to suggest active angina.  No indication for further ischemic evaluation at this time - Continue aspirin  81 mg daily, carvedilol  3.125 twice daily, and Repatha  140 mg q. 14 days   Chornic HFrEF / Ischemic cardiomyopathy S/p Barostim implatation 11/2022 & s/p ICD in 2010 Echo 08/11/2023 with LVEF 30 to 35% - NYHA class III symptoms.  Noted initial improvement in dyspnea post Barostim implantation, however dyspnea seemed to worsen when he picked back up smoking - Today he is euvolemic and well compensated on exam.  No weight gain, orthopnea, PND, LEE.  Not using daily diuretic - GDMT limited by borderline low blood  pressures - Continues to experience intermittent dizziness and lightheadedness upon standing - Will decrease losartan  to 12.5 mg daily - Continue carvedilol  3.125 mg twice daily, Jardiance  10 mg daily, spironolactone  12.5 mg daily, and furosemide  40 mg as needed - Consider transitioning carvedilol  to metoprolol  and follow-up visit if blood pressure remains borderline low - Encouraged daily weights, <2 L fluid restriction, salt restriction   VT s/p [employer] dual chamber ICD S/p ICD in 2010 with generator change 03/2022. Hx VT ablation in 2016  -No recurrence - Managed by EP - Managed on amiodarone  200 mg daily and mexiletine 200 mg twice daily   Paroxysmal artrial fibrillation  Quiescent and low burden overall He denies any symptoms concerning for recurrent  PAF - Managed by EP - Continue amiodarone  200 mg daily, carvedilol  3.25 mg twice daily - Continue Eliquis  5 mg twice daily, denies bleeding concerns, adequately dosed  - Managed by EP - TSH and LFTs unremarkable on 01/20/2024   Hypertension  Blood pressure today 104/72 and borderline low - Decrease losartan  to 12.5 mg daily - Continue HF GDMT as noted above   Hyperlipidemia  LDL 52, HDL 55, TG 114, TC 127 on 5/88/7974 LDL under excellent control and LDL under goal less than 55 - Continue Repatha  140 mg q. 14 days   Tobacco Use Patient resumed smoking after a period of abstinence of three to four years. Currently smoking a pack a day - Encouraged complete cessation    Ascending thoracic aneurysm CT angio chest aorta 04/2021 showed mild dilation of the ascending thoracic aorta with a maximum diameter 4.3 cm.  Mild dilation of proximal descending thoracic aorta with a maximum diameter of 4.0 cm Echocardiogram 07/2022 showed mild dilation of ascending aorta measuring 42 mm - Repeat CT angio chest aorta today for routine surveillance   Infrarenal abdominal aortic aneurysm CT abdomen 05/2022 shows infrarenal abdominal  aortic aneurysm 3.3 cm - Recommend follow-up ultrasound in 3 years for routine surveillance       Dispo:  Return in about 3 months (around 05/11/2024).  Signed, [name], NP

## 2024-02-15 ENCOUNTER — Other Ambulatory Visit: Payer: Self-pay | Admitting: Cardiovascular Disease

## 2024-03-03 ENCOUNTER — Emergency Department (HOSPITAL_COMMUNITY)

## 2024-03-03 ENCOUNTER — Other Ambulatory Visit: Payer: Self-pay

## 2024-03-03 ENCOUNTER — Inpatient Hospital Stay (HOSPITAL_COMMUNITY)
Admission: EM | Admit: 2024-03-03 | Discharge: 2024-03-05 | DRG: 291 | Disposition: A | Attending: Internal Medicine | Admitting: Internal Medicine

## 2024-03-03 ENCOUNTER — Encounter (HOSPITAL_COMMUNITY): Payer: Self-pay

## 2024-03-03 DIAGNOSIS — I5043 Acute on chronic combined systolic (congestive) and diastolic (congestive) heart failure: Secondary | ICD-10-CM | POA: Diagnosis present

## 2024-03-03 DIAGNOSIS — D638 Anemia in other chronic diseases classified elsewhere: Secondary | ICD-10-CM | POA: Diagnosis present

## 2024-03-03 DIAGNOSIS — R0609 Other forms of dyspnea: Secondary | ICD-10-CM

## 2024-03-03 DIAGNOSIS — K219 Gastro-esophageal reflux disease without esophagitis: Secondary | ICD-10-CM | POA: Diagnosis present

## 2024-03-03 DIAGNOSIS — R0602 Shortness of breath: Principal | ICD-10-CM

## 2024-03-03 DIAGNOSIS — I5041 Acute combined systolic (congestive) and diastolic (congestive) heart failure: Secondary | ICD-10-CM

## 2024-03-03 DIAGNOSIS — Z87891 Personal history of nicotine dependence: Secondary | ICD-10-CM

## 2024-03-03 DIAGNOSIS — I5022 Chronic systolic (congestive) heart failure: Secondary | ICD-10-CM

## 2024-03-03 DIAGNOSIS — J9601 Acute respiratory failure with hypoxia: Secondary | ICD-10-CM

## 2024-03-03 DIAGNOSIS — I509 Heart failure, unspecified: Secondary | ICD-10-CM

## 2024-03-03 DIAGNOSIS — I4891 Unspecified atrial fibrillation: Secondary | ICD-10-CM | POA: Diagnosis present

## 2024-03-03 DIAGNOSIS — Z87438 Personal history of other diseases of male genital organs: Secondary | ICD-10-CM | POA: Insufficient documentation

## 2024-03-03 DIAGNOSIS — J4520 Mild intermittent asthma, uncomplicated: Secondary | ICD-10-CM

## 2024-03-03 DIAGNOSIS — I10 Essential (primary) hypertension: Secondary | ICD-10-CM | POA: Diagnosis present

## 2024-03-03 DIAGNOSIS — D693 Immune thrombocytopenic purpura: Secondary | ICD-10-CM | POA: Insufficient documentation

## 2024-03-03 DIAGNOSIS — F324 Major depressive disorder, single episode, in partial remission: Secondary | ICD-10-CM | POA: Diagnosis present

## 2024-03-03 DIAGNOSIS — Z8679 Personal history of other diseases of the circulatory system: Secondary | ICD-10-CM

## 2024-03-03 DIAGNOSIS — J45909 Unspecified asthma, uncomplicated: Secondary | ICD-10-CM | POA: Insufficient documentation

## 2024-03-03 DIAGNOSIS — I5023 Acute on chronic systolic (congestive) heart failure: Secondary | ICD-10-CM

## 2024-03-03 DIAGNOSIS — I480 Paroxysmal atrial fibrillation: Secondary | ICD-10-CM

## 2024-03-03 DIAGNOSIS — D649 Anemia, unspecified: Secondary | ICD-10-CM | POA: Diagnosis present

## 2024-03-03 LAB — CBC
HCT: 38.2 % — ABNORMAL LOW (ref 39.0–52.0)
Hemoglobin: 11.3 g/dL — ABNORMAL LOW (ref 13.0–17.0)
MCH: 29 pg (ref 26.0–34.0)
MCHC: 29.6 g/dL — ABNORMAL LOW (ref 30.0–36.0)
MCV: 97.9 fL (ref 80.0–100.0)
Platelets: 133 K/uL — ABNORMAL LOW (ref 150–400)
RBC: 3.9 MIL/uL — ABNORMAL LOW (ref 4.22–5.81)
RDW: 19.9 % — ABNORMAL HIGH (ref 11.5–15.5)
WBC: 6.8 K/uL (ref 4.0–10.5)
nRBC: 0 % (ref 0.0–0.2)

## 2024-03-03 LAB — URINALYSIS, ROUTINE W REFLEX MICROSCOPIC
Bilirubin Urine: NEGATIVE
Glucose, UA: >=500 mg/dL — AB
Hgb urine dipstick: NEGATIVE
Ketones, ur: NEGATIVE mg/dL
Leukocytes,Ua: NEGATIVE
Nitrite: NEGATIVE
Protein, ur: NEGATIVE mg/dL
Specific Gravity, Urine: 1.015 (ref 1.005–1.030)
pH: 7 (ref 5.0–8.0)

## 2024-03-03 LAB — COMPREHENSIVE METABOLIC PANEL WITH GFR
ALT: 14 U/L (ref 0–44)
AST: 18 U/L (ref 15–41)
Albumin: 2.8 g/dL — ABNORMAL LOW (ref 3.5–5.0)
Alkaline Phosphatase: 48 U/L (ref 38–126)
Anion gap: 11 (ref 5–15)
BUN: 7 mg/dL — ABNORMAL LOW (ref 8–23)
CO2: 29 mmol/L (ref 22–32)
Calcium: 8.6 mg/dL — ABNORMAL LOW (ref 8.9–10.3)
Chloride: 96 mmol/L — ABNORMAL LOW (ref 98–111)
Creatinine, Ser: 0.64 mg/dL (ref 0.61–1.24)
GFR, Estimated: >60 mL/min (ref >60)
Glucose, Bld: 103 mg/dL — ABNORMAL HIGH (ref 70–99)
Potassium: 3.9 mmol/L (ref 3.5–5.1)
Sodium: 136 mmol/L (ref 135–145)
Total Bilirubin: 1.1 mg/dL (ref 0.0–1.2)
Total Protein: 6.5 g/dL (ref 6.5–8.1)

## 2024-03-03 LAB — I-STAT CHEM 8, ED
BUN: 7 mg/dL — ABNORMAL LOW (ref 8–23)
Calcium, Ion: 1.12 mmol/L — ABNORMAL LOW (ref 1.15–1.40)
Chloride: 97 mmol/L — ABNORMAL LOW (ref 98–111)
Creatinine, Ser: 0.7 mg/dL (ref 0.61–1.24)
Glucose, Bld: 98 mg/dL (ref 70–99)
HCT: 40 % (ref 39.0–52.0)
Hemoglobin: 13.6 g/dL (ref 13.0–17.0)
Potassium: 4 mmol/L (ref 3.5–5.1)
Sodium: 137 mmol/L (ref 135–145)
TCO2: 28 mmol/L (ref 22–32)

## 2024-03-03 LAB — TROPONIN I (HIGH SENSITIVITY)
Troponin I (High Sensitivity): 15 ng/L (ref <18)
Troponin I (High Sensitivity): 11 ng/L (ref <18)

## 2024-03-03 LAB — RESP PANEL BY RT-PCR (RSV, FLU A&B, COVID)  RVPGX2
Influenza A by PCR: NEGATIVE
Influenza B by PCR: NEGATIVE
Resp Syncytial Virus by PCR: NEGATIVE
SARS Coronavirus 2 by RT PCR: NEGATIVE

## 2024-03-03 LAB — URINALYSIS, MICROSCOPIC (REFLEX)

## 2024-03-03 LAB — BRAIN NATRIURETIC PEPTIDE: B Natriuretic Peptide: 267.5 pg/mL — ABNORMAL HIGH (ref 0.0–100.0)

## 2024-03-03 LAB — CBG MONITORING, ED: Glucose-Capillary: 96 mg/dL (ref 70–99)

## 2024-03-03 MED ORDER — ACETAMINOPHEN 650 MG RE SUPP
650.0000 mg | Freq: Four times a day (QID) | RECTAL | Status: DC | PRN
  Filled 2024-03-03: qty 1

## 2024-03-03 MED ORDER — APIXABAN 5 MG PO TABS
5.0000 mg | ORAL_TABLET | Freq: Two times a day (BID) | ORAL | Status: DC
  Administered 2024-03-03 – 2024-03-05 (×4): 5 mg via ORAL
  Filled 2024-03-03 (×4): qty 1

## 2024-03-03 MED ORDER — LEVALBUTEROL HCL 0.63 MG/3ML IN NEBU: 0.6300 mg | INHALATION_SOLUTION | Freq: Four times a day (QID) | RESPIRATORY_TRACT | Status: DC | PRN

## 2024-03-03 MED ORDER — FUROSEMIDE 10 MG/ML IJ SOLN: 20.0000 mg | Freq: Two times a day (BID) | INTRAMUSCULAR | Status: DC

## 2024-03-03 MED ORDER — PREGABALIN 75 MG PO CAPS
75.0000 mg | ORAL_CAPSULE | Freq: Every day | ORAL | Status: DC
  Administered 2024-03-03 – 2024-03-05 (×3): 75 mg via ORAL
  Filled 2024-03-03 (×3): qty 1

## 2024-03-03 MED ORDER — ONDANSETRON HCL 4 MG PO TABS: 4.0000 mg | ORAL_TABLET | Freq: Four times a day (QID) | ORAL | Status: DC | PRN

## 2024-03-03 MED ORDER — SPIRONOLACTONE 12.5 MG HALF TABLET
12.5000 mg | ORAL_TABLET | Freq: Every day | ORAL | Status: DC
  Administered 2024-03-04 – 2024-03-05 (×2): 12.5 mg via ORAL
  Filled 2024-03-03 (×2): qty 1

## 2024-03-03 MED ORDER — MEXILETINE HCL 200 MG PO CAPS
200.0000 mg | ORAL_CAPSULE | Freq: Two times a day (BID) | ORAL | Status: DC
  Administered 2024-03-03 – 2024-03-05 (×4): 200 mg via ORAL
  Filled 2024-03-03 (×6): qty 1

## 2024-03-03 MED ORDER — FUROSEMIDE 10 MG/ML IJ SOLN
40.0000 mg | Freq: Once | INTRAMUSCULAR | Status: AC
  Administered 2024-03-03: 40 mg via INTRAVENOUS
  Filled 2024-03-03: qty 4

## 2024-03-03 MED ORDER — ASPIRIN 81 MG PO TBEC
81.0000 mg | DELAYED_RELEASE_TABLET | Freq: Every day | ORAL | Status: DC
  Administered 2024-03-04 – 2024-03-05 (×2): 81 mg via ORAL
  Filled 2024-03-03 (×2): qty 1

## 2024-03-03 MED ORDER — MELATONIN 5 MG PO TABS: 10.0000 mg | ORAL_TABLET | Freq: Every day | ORAL | Status: DC

## 2024-03-03 MED ORDER — IPRATROPIUM BROMIDE 0.02 % IN SOLN: 0.5000 mg | Freq: Four times a day (QID) | RESPIRATORY_TRACT | Status: DC | PRN

## 2024-03-03 MED ORDER — MELATONIN 5 MG PO TABS: 10.0000 mg | ORAL_TABLET | Freq: Every evening | ORAL | Status: DC | PRN

## 2024-03-03 MED ORDER — SODIUM CHLORIDE 0.9% FLUSH
3.0000 mL | Freq: Two times a day (BID) | INTRAVENOUS | Status: DC
  Administered 2024-03-04 (×2): 3 mL via INTRAVENOUS

## 2024-03-03 MED ORDER — BIOTIN 5000 MCG PO CAPS: 5000.0000 ug | ORAL_CAPSULE | Freq: Every day | ORAL | Status: DC

## 2024-03-03 MED ORDER — ACETAMINOPHEN 325 MG PO TABS
650.0000 mg | ORAL_TABLET | Freq: Four times a day (QID) | ORAL | Status: DC | PRN
  Administered 2024-03-03 – 2024-03-05 (×3): 650 mg via ORAL
  Filled 2024-03-03 (×3): qty 2

## 2024-03-03 MED ORDER — GUAIFENESIN ER 600 MG PO TB12
600.0000 mg | ORAL_TABLET | Freq: Two times a day (BID) | ORAL | Status: DC
  Administered 2024-03-03 – 2024-03-05 (×4): 600 mg via ORAL
  Filled 2024-03-03 (×4): qty 1

## 2024-03-03 MED ORDER — OYSTER SHELL CALCIUM/D3 500-5 MG-MCG PO TABS
1.0000 | ORAL_TABLET | Freq: Every day | ORAL | Status: DC
  Administered 2024-03-04 – 2024-03-05 (×2): 1 via ORAL
  Filled 2024-03-03 (×2): qty 1

## 2024-03-03 MED ORDER — EMPAGLIFLOZIN 10 MG PO TABS
10.0000 mg | ORAL_TABLET | Freq: Every day | ORAL | Status: DC
  Administered 2024-03-04 – 2024-03-05 (×2): 10 mg via ORAL
  Filled 2024-03-03 (×2): qty 1

## 2024-03-03 MED ORDER — ESCITALOPRAM OXALATE 10 MG PO TABS
20.0000 mg | ORAL_TABLET | Freq: Every day | ORAL | Status: DC
  Administered 2024-03-04 – 2024-03-05 (×2): 20 mg via ORAL
  Filled 2024-03-03 (×2): qty 2

## 2024-03-03 MED ORDER — SODIUM CHLORIDE 0.9% FLUSH
3.0000 mL | Freq: Two times a day (BID) | INTRAVENOUS | Status: DC
  Administered 2024-03-03 – 2024-03-05 (×4): 3 mL via INTRAVENOUS

## 2024-03-03 MED ORDER — TAMSULOSIN HCL 0.4 MG PO CAPS
0.4000 mg | ORAL_CAPSULE | Freq: Every day | ORAL | Status: DC
  Administered 2024-03-03 – 2024-03-04 (×2): 0.4 mg via ORAL
  Filled 2024-03-03 (×2): qty 1

## 2024-03-03 MED ORDER — AMIODARONE HCL 200 MG PO TABS
200.0000 mg | ORAL_TABLET | Freq: Every day | ORAL | Status: DC
  Administered 2024-03-04 – 2024-03-05 (×2): 200 mg via ORAL
  Filled 2024-03-03 (×2): qty 1

## 2024-03-03 MED ORDER — ONDANSETRON HCL 4 MG/2ML IJ SOLN: 4.0000 mg | Freq: Four times a day (QID) | INTRAMUSCULAR | Status: DC | PRN

## 2024-03-03 MED ORDER — ADULT MULTIVITAMIN W/MINERALS CH
1.0000 | ORAL_TABLET | Freq: Every day | ORAL | Status: DC
  Administered 2024-03-04 – 2024-03-05 (×2): 1 via ORAL
  Filled 2024-03-03 (×2): qty 1

## 2024-03-03 MED ORDER — LOSARTAN POTASSIUM 25 MG PO TABS
12.5000 mg | ORAL_TABLET | Freq: Every day | ORAL | Status: DC
  Administered 2024-03-04: 12.5 mg via ORAL
  Filled 2024-03-03 (×2): qty 1

## 2024-03-03 MED ORDER — UMECLIDINIUM-VILANTEROL 62.5-25 MCG/ACT IN AEPB
1.0000 | INHALATION_SPRAY | Freq: Every day | RESPIRATORY_TRACT | Status: DC
  Administered 2024-03-04 – 2024-03-05 (×2): 1 via RESPIRATORY_TRACT
  Filled 2024-03-03: qty 14

## 2024-03-03 MED ORDER — SODIUM CHLORIDE 0.9 % IV SOLN: 250.0000 mL | INTRAVENOUS | Status: AC | PRN

## 2024-03-03 MED ORDER — PANTOPRAZOLE SODIUM 40 MG PO TBEC
40.0000 mg | DELAYED_RELEASE_TABLET | Freq: Every day | ORAL | Status: DC
  Administered 2024-03-04 – 2024-03-05 (×2): 40 mg via ORAL
  Filled 2024-03-03 (×2): qty 1

## 2024-03-03 MED ORDER — SODIUM CHLORIDE 0.9% FLUSH: 3.0000 mL | INTRAVENOUS | Status: DC | PRN

## 2024-03-03 MED ORDER — CARVEDILOL 3.125 MG PO TABS
3.1250 mg | ORAL_TABLET | Freq: Two times a day (BID) | ORAL | Status: DC
  Administered 2024-03-03 – 2024-03-05 (×4): 3.125 mg via ORAL
  Filled 2024-03-03 (×4): qty 1

## 2024-03-03 NOTE — ED Notes (Signed)
 Patient given urinal and asked to provide urine sample when he is able to.

## 2024-03-03 NOTE — ED Notes (Signed)
Pt placed on 2L per MD.

## 2024-03-03 NOTE — H&P (Signed)
 History and Physical    Steven Guzman FMW:[id number] DOB: [birth year] DOA: 03/03/2024  PCP: Esmeralda Morton SAUNDERS, PA-C   Patient coming from: Home   Chief Complaint:  Chief Complaint  Patient presents with   Shortness of Breath   ED TRIAGE note:Pt BIB [facility] from home. Pt presents with ShOB, DOE and generalized weakness for the past few days. EMS report pt has increased pedal edema and bilateral rales in the lower lobes of the lung.   EMS Vitals  122/70 HR 80 SpO2 92% on RA CBG 113  HPI:  Steven Guzman is a 83 y.o. male with medical history significant of CAD status post stent placement, HFrEF 30 to 35%, ischemic cardiomyopathy, ventricular tachycardia status post [employer] dual chamber ICD 2010 and status post ablation 2016, paroxysmal atrial fibrillation on Eliquis , essential hypertension, hyperlipidemia, chronic thrombocytopenia, former smoker, ascending thoracic aneurysm and infrarenal abdominal aortic aneurysm, chronic pain syndrome, major depressive disorder, chronic iron deficiency, GERD, and reactive airway disease presented to emergency department multiple complaint include shortness of breath, bilateral lower extremities worsening edema, generalized fatigue for last 2 to 3 days.  Patient denies any chest pain, palpitation, nausea, vomiting.  Complaining about cough denies any fever and chill. Patient does not have any wheezing however EDP reported that when patient was ambulating and trying to speak full sentences O2 sat dropped below 88% and requiring 2 L of oxygen .   ED Course:  At presentation to ED patient is hemodynamically stable.  Afebrile. Lab work, flat troponin.  Respiratory panel negative for COVID RSV flu.  UA no evidence of UTI.  Elevated BNP 267. CBC unremarkable normal WBC count, stable H&H and chronically low platelet count 133. CMP unremarkable.  EKG showing atrial fibrillation with premature pedicular complex, right bundle branch block.  Heart rate  82.  Chest x-ray no acute disease process.  In the ED patient received IV Lasix  40 mg.  Hospitalist consulted for further evaluation management of acute on chronic CHF exacerbation and acute hypoxic respiratory failure in the setting of CHF exacerbation.   Significant labs in the ED: Lab Orders         Resp panel by RT-PCR (RSV, Flu A&B, Covid) Anterior Nasal Swab         Comprehensive metabolic panel         CBC         Urinalysis, Routine w reflex microscopic -Urine, Clean Catch         BNP (Order if Patient has history of Heart Failure)         Urinalysis, Microscopic (reflex)         Comprehensive metabolic panel         CBC         CBG monitoring, ED         I-stat chem 8, ED (not at [facility], [facility] or [facility])       Review of Systems:  Review of Systems  Constitutional:  Negative for chills, fever, malaise/fatigue and weight loss.  HENT:  Negative for hearing loss.   Respiratory:  Positive for cough and wheezing. Negative for sputum production and shortness of breath.   Cardiovascular:  Positive for orthopnea, leg swelling and PND. Negative for chest pain, palpitations and claudication.  Gastrointestinal:  Negative for abdominal pain, heartburn, nausea and vomiting.  Neurological:  Negative for dizziness and headaches.  Psychiatric/Behavioral:  The patient is not nervous/anxious.     Past Medical History:  Diagnosis Date  AICD (automatic cardioverter/defibrillator) present    [employer] EL ICD D121/ [phone]   Aneurysm    a. Aneurysmal infrarenal aorta up to 33 mm on CT 10/2014, recommended f/u due 10/2017   Anginal pain    Anxiety    Arthritis    right foot   Basal cell carcinoma of nose    S/P MOHS   Biliary acute pancreatitis    CAD (coronary artery disease)    a. s/p MI in 1994 with PCI to LAD at that time b. cath 10/2012 demonstrated EF 30%, inferior akinesis with mild hypokinesis of all walls, patent LAD and RCA stents; ostial PDA with 80-90% obstruction  with medical therapy recommended    Chronic systolic CHF (congestive heart failure) (HCC)    EF 30 to 35 % as of 09/2014.    CKD (chronic kidney disease), stage III (HCC)    Complication of anesthesia 10/2014   had to have defibrillator w/ERCP- CODED after having gallstones removed   COPD (chronic obstructive pulmonary disease) (HCC)    a. followed by pulmonary, COPD GOLD stage II   Depression    Diverticulosis of colon 07/2014   noted on CT   Dyspnea    with exertion   GERD (gastroesophageal reflux disease)    Hiatal hernia    large   History of kidney stones    passed stone   Hyperglycemia 10/2012   Hyperlipidemia    Hypertension    Myocardial infarction [facility]) 1994; 2011   Pneumonia 1946; 2015   Prostate enlargement 07/2014   observed on CT   Tobacco abuse    Ventricular tachycardia (HCC)    a. 08/2009 s/p BSX E110 Teligen 100 AICD, ser#: [id number];  b. 08/2008 VT req ATP - detection reprogrammed from 160 to 150. c. EPS and VT ablation by Dr. Waddell 12/21/2014    Past Surgical History:  Procedure Laterality Date   BIOPSY  12/21/2017   Procedure: BIOPSY;  Surgeon: Abran Norleen SAILOR, MD;  Location: [facility];  Service: Endoscopy;;   CATARACT EXTRACTION W/ INTRAOCULAR LENS  IMPLANT, BILATERAL Bilateral ~ 2011   COLONOSCOPY     COLONOSCOPY WITH PROPOFOL  N/A 12/21/2017   Procedure: COLONOSCOPY WITH PROPOFOL ;  Surgeon: Abran Norleen SAILOR, MD;  Location: [facility];  Service: Endoscopy;  Laterality: N/A;   ELECTROPHYSIOLOGIC STUDY N/A 12/21/2014   Procedure: V Tach Ablation;  Surgeon: Danelle LELON Waddell, MD;  Location: [facility] INVASIVE CV LAB;  Service: Cardiovascular;  Laterality: N/A;   ERCP N/A 11/16/2014   Procedure: ENDOSCOPIC RETROGRADE CHOLANGIOPANCREATOGRAPHY (ERCP);  Surgeon: Lamar JONETTA Aho, MD;  Location: [facility] ENDOSCOPY;  Service: Endoscopy;  Laterality: N/A;   ESOPHAGOGASTRODUODENOSCOPY (EGD) WITH PROPOFOL  N/A 12/21/2017   Procedure: ESOPHAGOGASTRODUODENOSCOPY (EGD) WITH PROPOFOL ;  Surgeon:  Abran Norleen SAILOR, MD;  Location: [facility];  Service: Endoscopy;  Laterality: N/A;   EYE SURGERY     FOOT SURGERY Left 2005   fixed bone that stuck out in my ankle area   HEMORRHOID BANDING     ICD GENERATOR CHANGEOUT N/A 04/20/2022   Procedure: ICD GENERATOR CHANGEOUT;  Surgeon: Waddell Danelle LELON, MD;  Location: [facility] INVASIVE CV LAB;  Service: Cardiovascular;  Laterality: N/A;   IMPLANTABLE CARDIOVERTER DEFIBRILLATOR IMPLANT  09/06/09   BSX dual chamber ICD implanted in [place]  for cardiac arrest 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   INGUINAL HERNIA REPAIR Right ~ 1995   INGUINAL HERNIA REPAIR Left 04/10/2022   Procedure: HERNIA REPAIR INGUINAL ADULT;  Surgeon: Ebbie Cough, MD;  Location:  [facility] OR;  Service: General;  Laterality: Left;   LEFT HEART CATH AND CORONARY ANGIOGRAPHY N/A 03/19/2022   Procedure: LEFT HEART CATH AND CORONARY ANGIOGRAPHY;  Surgeon: Jordan, Peter M, MD;  Location: [facility] INVASIVE CV LAB;  Service: Cardiovascular;  Laterality: N/A;   LEFT HEART CATHETERIZATION WITH CORONARY ANGIOGRAM N/A 11/25/2012   demonstrated EF 30%, inferior akinesis with mild hypokinesis of all walls, patent LAD and RCA stents; ostial PDA with 80-90% obstruction with medical therapy recommended   MOHS SURGERY  2008   nose, skin graft   POLYPECTOMY  12/21/2017   Procedure: POLYPECTOMY;  Surgeon: Abran Norleen SAILOR, MD;  Location: [facility];  Service: Endoscopy;;   RETINAL DETACHMENT SURGERY Right 2013   TENOLYSIS Right 12/21/2013   Procedure: TENOLYSIS FLEXOR CARPI RADIALIS ,DEBRIDEMENT RIGHT JOINT WRIST,DEBRIDEMENT SCAPHOTRAPEZIAL TRAPEZOID, REPAIR OF EXTENSOR HOOD;  Surgeon: Arley Curia, MD;  Location: [facility];  Service: Orthopedics;  Laterality: Right;   TOE SURGERY Right 09/2019   3rd toe/hammer toe   V-TACH ABLATION  12/21/2014   VIDEO BRONCHOSCOPY Bilateral 01/09/2016   Procedure: VIDEO BRONCHOSCOPY WITHOUT FLUORO;  Surgeon: Vicenta KATHEE Lennert, MD;  Location: [facility];  Service:  Cardiopulmonary;  Laterality: Bilateral;     reports that he has been smoking cigarettes. He has a 55 pack-year smoking history. He has never used smokeless tobacco. He reports current alcohol use. He reports that he does not use drugs.  Allergies  Allergen Reactions   Sulfa Antibiotics Hives   Cephalexin  Itching    Family History  Problem Relation Age of Onset   Heart attack Brother    CAD Father    Hypertension Father    CAD Mother    Hypertension Mother    Hypertension Brother    Stroke Neg Hx     Prior to Admission medications   Medication Sig Start Date End Date Taking? Authorizing Provider  acetaminophen  (TYLENOL ) 500 MG tablet Take 1,000 mg by mouth every 6 (six) hours as needed for moderate pain.    [provider]  albuterol  (PROVENTIL ) (2.5 MG/3ML) 0.083% nebulizer solution USE 1 VIAL VIA NEBULIZER EVERY 6 HOURS AS NEEDED FOR WHEEZING OR SHORTNESS OF BREATH 07/18/19   Olalere, Adewale A, MD  amiodarone  (PACERONE ) 200 MG tablet Take 1 tablet (200 mg total) by mouth daily. \ 08/27/23   Lesia Ozell Barter, PA-C  Artificial Tear Solution (SOOTHE XP OP) Place 2 drops into both eyes daily as needed (dry eyes).    [provider]  Ascorbic Acid (VITAMIN C) 1000 MG tablet Take 1,000 mg by mouth daily.    [provider]  aspirin  EC 81 MG tablet Take 1 tablet (81 mg total) by mouth daily. Swallow whole. 03/21/22   Meng, Hao, PA  Biotin  5000 MCG CAPS Take 5,000 mcg by mouth daily.    [provider]  Calcium  Citrate-Vitamin D  (CALCIUM  + D PO) Take 1 tablet by mouth daily.    [provider]  carvedilol  (COREG ) 3.125 MG tablet Take 1 tablet (3.125 mg total) by mouth 2 (two) times daily. 04/26/23   Rana Lum CROME, NP  cetirizine (ZYRTEC) 10 MG tablet Take 10 mg by mouth daily.    [provider]  Cholecalciferol 50 MCG (2000 UT) TABS Take 2,000 Units by mouth daily at 6 (six) AM. 09/12/18   [provider]   Cyanocobalamin  (B12 LIQUID HEALTH BOOSTER PO) Take 5,000 mcg by mouth daily.    [provider]  Docusate Sodium  (DSS) 100  MG CAPS Take 2 capsules by mouth daily in the afternoon. 02/10/22   [provider]  ELIQUIS  5 MG TABS tablet TAKE 1 TABLET BY MOUTH TWICE A DAY 01/11/24   Croitoru, Mihai, MD  empagliflozin  (JARDIANCE ) 10 MG TABS tablet Take 1 tablet (10 mg total) by mouth daily. 04/19/23   Croitoru, Mihai, MD  escitalopram  (LEXAPRO ) 20 MG tablet Take 20 mg by mouth daily. 11/03/23   [provider]  Evolocumab  (REPATHA  SURECLICK) 140 MG/ML SOAJ INJECT 1 ML INTO THE SKIN EVERY 14 DAYS 01/10/24   Croitoru, Mihai, MD  fluticasone  (FLONASE ) 50 MCG/ACT nasal spray Place 2 sprays into both nostrils daily. 02/13/21   Fargo, Amy E, NP  furosemide  (LASIX ) 40 MG tablet Take 1 tablet (40 mg total) by mouth daily as needed. For weight gain of 3 lbs overnight or 5 lbs in one week. 01/20/24 04/19/24  Lesia Ozell Barter, PA-C  guaiFENesin  (MUCINEX ) 600 MG 12 hr tablet Take 1 tablet by mouth 2 (two) times daily.    [provider]  Ipratropium-Albuterol  (COMBIVENT  RESPIMAT) 20-100 MCG/ACT AERS respimat Inhale 1 puff into the lungs every 6 (six) hours. Shortness of breath or wheezing 09/18/21   Fargo, Amy E, NP  IRON PO Take 1 tablet by mouth daily.    [provider]  losartan  (COZAAR ) 25 MG tablet Take 0.5 tablets (12.5 mg total) by mouth daily. 02/09/24   Rana Lum CROME, NP  Magnesium  200 MG TABS Take 400 mg by mouth daily.    [provider]  melatonin 5 MG TABS Take 10 mg by mouth at bedtime.    [provider]  mexiletine (MEXITIL ) 200 MG capsule TAKE 1 CAPSULE BY MOUTH 2 TIMES A DAY 02/17/24   Fountain, Madison L, NP  Multiple Vitamins-Minerals (CENTRUM ADULTS PO) Take 1 tablet by mouth daily.    [provider]  mupirocin  ointment (BACTROBAN ) 2 % Apply 1 Application topically daily as needed (wound care). 11/04/22   [provider]  nitroGLYCERIN  (NITROSTAT ) 0.4 MG SL tablet Place 1 tablet (0.4 mg total) under the tongue every 5 (five) minutes as needed for chest pain. DISSOLVE 1 TABLET UNDER THE TONGUE EVERY 5 MINUTES FOR 3 DOSES 12/15/23   Court Dorn PARAS, MD  omeprazole (PRILOSEC) 20 MG capsule Take 20 mg by mouth daily.     [provider]  potassium chloride  (KLOR-CON ) 10 MEQ tablet Take 1 tablet (10 mEq total) by mouth daily. Take as needed only when you take your lasix  01/22/23 02/09/24  Goodrich, Callie E, PA-C  pregabalin  (LYRICA ) 25 MG capsule Take by mouth in the morning and at bedtime. 03/08/23   [provider]  Respiratory Therapy Supplies (FLUTTER) DEVI Use as directed 03/16/17   Alaine Vicenta NOVAK, MD  Spacer/Aero-Holding Chambers (OPTICHAMBER DIAMOND ) MISC optichamber [facility] 09/12/19   Olalere, Jennet LABOR, MD  spironolactone  (ALDACTONE ) 25 MG tablet Take 0.5 tablets (12.5 mg total) by mouth daily. 11/02/22   Emelia Josefa HERO, NP  STIOLTO RESPIMAT 2.5-2.5 MCG/ACT AERS Inhale 2 puffs into the lungs daily. 09/22/21   [provider]  tamsulosin  (FLOMAX ) 0.4 MG CAPS capsule TAKE ONE CAPSULE BY MOUTH DAILY AFTER SUPPER 06/30/21   Fargo, Amy E, NP  traMADol  (ULTRAM ) 50 MG tablet Take 50 mg by mouth every 12 (twelve) hours as needed for moderate pain (pain score 4-6) or severe pain (pain score 7-10). 11/04/23   [provider]     Physical Exam: Vitals:   03/03/24 1645  03/03/24 1700 03/03/24 1818 03/03/24 1818  BP: (!) 131/92 (!) 123/97  113/72  Pulse: 81 92  75  Resp:    (!) 24  Temp:   98.5 F (36.9 C)   TempSrc:   Oral   SpO2: 98% 96%  94%  Weight:      Height:        Physical Exam Vitals and nursing note reviewed.  HENT:     Head: Normocephalic.  Cardiovascular:     Rate and Rhythm: Normal rate. Rhythm irregular.  Pulmonary:     Effort: Pulmonary effort is normal.     Breath sounds: Rales present. No decreased breath sounds, wheezing or rhonchi.   Musculoskeletal:     Cervical back: Normal range of motion.  Skin:    Capillary Refill: Capillary refill takes less than 2 seconds.  Neurological:     Mental Status: He is alert and oriented to person, place, and time.      Labs on Admission: I have personally reviewed following labs and imaging studies  CBC: Recent Labs  Lab 03/03/24 1414 03/03/24 1427  WBC 6.8  --   HGB 11.3* 13.6  HCT 38.2* 40.0  MCV 97.9  --   PLT 133*  --    Basic Metabolic Panel: Recent Labs  Lab 03/03/24 1414 03/03/24 1427  NA 136 137  K 3.9 4.0  CL 96* 97*  CO2 29  --   GLUCOSE 103* 98  BUN 7* 7*  CREATININE 0.64 0.70  CALCIUM  8.6*  --    GFR: Estimated Creatinine Clearance: 63.2 mL/min (by C-G formula based on SCr of 0.7 mg/dL). Liver Function Tests: Recent Labs  Lab 03/03/24 1414  AST 18  ALT 14  ALKPHOS 48  BILITOT 1.1  PROT 6.5  ALBUMIN 2.8*   No results for input(s): LIPASE, AMYLASE in the last 168 hours. No results for input(s): AMMONIA in the last 168 hours. Coagulation Profile: No results for input(s): INR, PROTIME in the last 168 hours. Cardiac Enzymes: Recent Labs  Lab 03/03/24 1414 03/03/24 1632  TROPONINIHS 15 11   BNP (last 3 results) Recent Labs    03/03/24 1414  BNP 267.5*   HbA1C: No results for input(s): HGBA1C in the last 72 hours. CBG: Recent Labs  Lab 03/03/24 1427  GLUCAP 96   Lipid Profile: No results for input(s): CHOL, HDL, LDLCALC, TRIG, CHOLHDL, LDLDIRECT in the last 72 hours. Thyroid  Function Tests: No results for input(s): TSH, T4TOTAL, FREET4, T3FREE, THYROIDAB in the last 72 hours. Anemia Panel: No results for input(s): VITAMINB12, FOLATE, FERRITIN, TIBC, IRON, RETICCTPCT in the last 72 hours. Urine analysis:    Component Value Date/Time   COLORURINE YELLOW 03/03/2024 1415   APPEARANCEUR CLEAR 03/03/2024 1415   LABSPEC 1.015 03/03/2024 1415   PHURINE 7.0 03/03/2024 1415    GLUCOSEU >=500 (A) 03/03/2024 1415   GLUCOSEU NEGATIVE 05/24/2015 1007   HGBUR NEGATIVE 03/03/2024 1415   BILIRUBINUR NEGATIVE 03/03/2024 1415   BILIRUBINUR Negative 12/11/2021 1529   KETONESUR NEGATIVE 03/03/2024 1415   PROTEINUR NEGATIVE 03/03/2024 1415   UROBILINOGEN 0.2 12/11/2021 1529   UROBILINOGEN 1.0 05/24/2015 1007   NITRITE NEGATIVE 03/03/2024 1415   LEUKOCYTESUR NEGATIVE 03/03/2024 1415    Radiological Exams on Admission: I have personally reviewed images DG Chest 2 View Result Date: 03/03/2024 CLINICAL DATA:  ShOB EXAM: CHEST - 2 VIEW COMPARISON:  11/12/2023 FINDINGS: Redemonstrated pulse stimulator device in the right chest with a single lead coursing into the right neck. No  focal airspace consolidation, pleural effusion, or pneumothorax. Mild cardiomegaly. Left chest pacemaker/AICD with leads terminating in the right atrium and right ventricle. Tortuous aorta with aortic atherosclerosis. Osteopenia. Multilevel thoracic osteophytosis. IMPRESSION: No acute cardiopulmonary abnormality. Electronically Signed   By: Rogelia Myers M.D.   On: 03/03/2024 15:27     EKG: My personal interpretation of EKG shows: Atrial fibrillation rate controlled heart rate 82.  Premature ventricular complex.    Assessment/Plan: Principal Problem:   Acute on chronic combined systolic and diastolic CHF (congestive heart failure) (HCC) Active Problems:   Essential hypertension   AF (atrial fibrillation) (HCC)   Anemia   GERD (gastroesophageal reflux disease)   Major depressive disorder with single episode, in partial remission   History of abdominal aortic aneurysm (AAA)   Chronic idiopathic thrombocytopenia (HCC)   Former smoker   Reactive airway disease   History of BPH   Acute CHF (congestive heart failure) (HCC)    Assessment and Plan: Acute on chronic CHF exacerbation Acute hypoxic respiratory failure secondary to CHF exacerbation Essential hypertension -Present emergency  department complaining of orthopnea, dyspnea, paroxysmal nocturnal dyspnea, bilateral lower extremity swelling and while in the emergency department patient O2 sat dropped below 88% with ambulation.  Patient reported he has been compliant with all medication except takes Lasix  as needed. -Lab work, flat troponin.  Respiratory panel negative for COVID RSV flu.  UA no evidence of UTI.  Elevated BNP 267. CBC unremarkable normal WBC count, stable H&H and chronically low platelet count 133. CMP unremarkable. EKG showing atrial fibrillation with premature pedicular complex, right bundle branch block.  Heart rate 82. Chest x-ray no acute disease process. In the ED patient received IV Lasix  40 mg. -Physical exam revealing bilateral Arcement trace edema. - In the ED patient received IV Lasix  40 mg - Continue IV Lasix  20 mg twice daily.  Continue other GDMT already medications include Jardiance , spironolactone , losartan  and Coreg . -Obtain echocardiogram - Continue cardiac monitoring. - Strict I's/O, daily weight and monitor urine output. Continue supplemental oxygen  2 L to keep O2 sat above 92% and wean down to room air as patient tolerates. - Please consult cardiology in the daytime for further evaluation management of CHF.   History of PACs and atrial fibrillation History of ventricular tachycardia status post ICD  -Continue Eliquis  5 mg twice daily, mexiletine 200 mg twice daily, amiodarone  200 mg daily and Coreg  3.125 mg twice daily.  History of CAD -Continue Coreg , Eliquis , and aspirin .  At home patient is on  Repatha  every 2 weeks.  Reactive airway disease -Continue Anoro Ellipta ,  Atrovent  as needed.  History of GERD -Continue Protonix   History of major depressive disorder -Continue Lexapro   History of AAA Former smoker Per chart review CT angio chest 01/2022 thoracic aneurysm measuring 4.5 cm.  Patient with need annual imaging to follow-up in outpatient.  History of BPH Continue  Flomax   GERD -Continue Protonix   Insomnia - Continue melatonin as needed at bedtime  Peripheral neuropathy - Continue Lyrica  at bedtime   DVT prophylaxis:  Eliquis  Code Status:  Full Code Diet: Heart healthy diet with fluid restriction 2 L/day and salt restriction 2 g/day Family Communication:   Family was present at bedside, at the time of interview. Opportunity was given to ask question and all questions were answered satisfactorily.  Disposition Plan: Continue monitor improvement of volume status with IV Lasix  Consults: Cardiology Admission status:   Inpatient, Telemetry bed  Severity of Illness: The appropriate patient status for this patient is  INPATIENT. Inpatient status is judged to be reasonable and necessary in order to provide the required intensity of service to ensure the patient's safety. The patient's presenting symptoms, physical exam findings, and initial radiographic and laboratory data in the context of their chronic comorbidities is felt to place them at high risk for further clinical deterioration. Furthermore, it is not anticipated that the patient will be medically stable for discharge from the hospital within 2 midnights of admission.   * I certify that at the point of admission it is my clinical judgment that the patient will require inpatient hospital care spanning beyond 2 midnights from the point of admission due to high intensity of service, high risk for further deterioration and high frequency of surveillance required.DEWAINE    Lavarius Doughten, MD [facility]  How to contact the [facility] Attending or Consulting provider 7A - 7P or covering provider during after hours 7P -7A, for this patient.  Check the care team in [facility] and look for a) attending/consulting [facility] provider listed and b) the [facility] team listed Log into [email or web address] and use [place]'s universal password to access. If you do not have the password, please contact the hospital operator. Locate the  [facility] provider you are looking for under [facility] and page to a number that you can be directly reached. If you still have difficulty reaching the provider, please page the [facility] (Director on Call) for the Hospitalists listed on amion for assistance.  03/03/2024, 8:28 PM

## 2024-03-03 NOTE — ED Notes (Signed)
 Patient breathing labored. Pt Spo2 97% on room air. EDP notified.

## 2024-03-03 NOTE — ED Provider Notes (Signed)
 [place] EMERGENCY DEPARTMENT AT [facility] Provider Note   CSN: [id number] Arrival date & time: 03/03/24  1407     Patient presents with: Shortness of Breath   TAG WURTZ is a 83 y.o. male.   HPI  Patient is an 83 year old male with a past medical history significant for CAD with stents and RCA and LAD as far back as 1995.  Most recent catheterization in 12/23 with distal occlusion of OM 3.  He is currently on medical therapy.  Has a history of ischemic cardiomyopathy with reduced ejection fraction status post Barostim implantation and ICD-most recent echo was done in May 2025 with EF of 30%.  Has a history of COPD, CKD, pneumonia  Seems that his dry weight is 65.8 kg today is 66.2 kg  He informs me that over the past 2 days he has noticed that he has been more short of breath he states that he has more dyspnea on exertion and some fatigue and weakness.  He is on Eliquis  for A-fib and states that he has been taking this as prescribed.  Twice daily.  He denies any chest pain nausea vomiting hemoptysis.  He said he coughs occasionally has been coughing up some clear sputum.     Prior to Admission medications   Medication Sig Start Date End Date Taking? Authorizing Provider  acetaminophen  (TYLENOL ) 500 MG tablet Take 1,000 mg by mouth every 6 (six) hours as needed for moderate pain.    [provider]  albuterol  (PROVENTIL ) (2.5 MG/3ML) 0.083% nebulizer solution USE 1 VIAL VIA NEBULIZER EVERY 6 HOURS AS NEEDED FOR WHEEZING OR SHORTNESS OF BREATH 07/18/19   Olalere, Adewale A, MD  amiodarone  (PACERONE ) 200 MG tablet Take 1 tablet (200 mg total) by mouth daily. \ 08/27/23   Lesia Ozell Barter, PA-C  Artificial Tear Solution (SOOTHE XP OP) Place 2 drops into both eyes daily as needed (dry eyes).    [provider]  Ascorbic Acid (VITAMIN C) 1000 MG tablet Take 1,000 mg by mouth daily.    [provider]  aspirin  EC 81 MG tablet Take 1 tablet  (81 mg total) by mouth daily. Swallow whole. 03/21/22   Meng, Hao, PA  Biotin  5000 MCG CAPS Take 5,000 mcg by mouth daily.    [provider]  Calcium  Citrate-Vitamin D  (CALCIUM  + D PO) Take 1 tablet by mouth daily.    [provider]  carvedilol  (COREG ) 3.125 MG tablet Take 1 tablet (3.125 mg total) by mouth 2 (two) times daily. 04/26/23   Rana Lum CROME, NP  cetirizine (ZYRTEC) 10 MG tablet Take 10 mg by mouth daily.    [provider]  Cholecalciferol  50 MCG (2000 UT) TABS Take 2,000 Units by mouth daily at 6 (six) AM. 09/12/18   [provider]  Cyanocobalamin  (B12 LIQUID HEALTH BOOSTER PO) Take 5,000 mcg by mouth daily.    [provider]  Docusate Sodium  (DSS) 100 MG CAPS Take 2 capsules by mouth daily in the afternoon. 02/10/22   [provider]  ELIQUIS  5 MG TABS tablet TAKE 1 TABLET BY MOUTH TWICE A DAY 01/11/24   Croitoru, Mihai, MD  empagliflozin  (JARDIANCE ) 10 MG TABS tablet Take 1 tablet (10 mg total) by mouth daily. 04/19/23   Croitoru, Mihai, MD  escitalopram  (LEXAPRO ) 20 MG tablet Take 20 mg by mouth daily. 11/03/23   [provider]  Evolocumab  (REPATHA  SURECLICK) 140 MG/ML SOAJ INJECT 1 ML INTO THE SKIN EVERY 14  DAYS 01/10/24   Croitoru, Mihai, MD  fluticasone  (FLONASE ) 50 MCG/ACT nasal spray Place 2 sprays into both nostrils daily. 02/13/21   Fargo, Amy E, NP  furosemide  (LASIX ) 40 MG tablet Take 1 tablet (40 mg total) by mouth daily as needed. For weight gain of 3 lbs overnight or 5 lbs in one week. 01/20/24 04/19/24  Lesia Ozell Barter, PA-C  guaiFENesin  (MUCINEX ) 600 MG 12 hr tablet Take 1 tablet by mouth 2 (two) times daily.    [provider]  Ipratropium-Albuterol  (COMBIVENT  RESPIMAT) 20-100 MCG/ACT AERS respimat Inhale 1 puff into the lungs every 6 (six) hours. Shortness of breath or wheezing 09/18/21   Fargo, Amy E, NP  IRON PO Take 1 tablet by mouth daily.    [provider]  losartan   (COZAAR ) 25 MG tablet Take 0.5 tablets (12.5 mg total) by mouth daily. 02/09/24   Rana Lum CROME, NP  Magnesium  200 MG TABS Take 400 mg by mouth daily.    [provider]  melatonin 5 MG TABS Take 10 mg by mouth at bedtime.    [provider]  mexiletine (MEXITIL ) 200 MG capsule TAKE 1 CAPSULE BY MOUTH 2 TIMES A DAY 02/17/24   Fountain, Madison L, NP  Multiple Vitamins-Minerals (CENTRUM ADULTS PO) Take 1 tablet by mouth daily.    [provider]  mupirocin  ointment (BACTROBAN ) 2 % Apply 1 Application topically daily as needed (wound care). 11/04/22   [provider]  nitroGLYCERIN  (NITROSTAT ) 0.4 MG SL tablet Place 1 tablet (0.4 mg total) under the tongue every 5 (five) minutes as needed for chest pain. DISSOLVE 1 TABLET UNDER THE TONGUE EVERY 5 MINUTES FOR 3 DOSES 12/15/23   Court Dorn PARAS, MD  omeprazole (PRILOSEC) 20 MG capsule Take 20 mg by mouth daily.     [provider]  potassium chloride  (KLOR-CON ) 10 MEQ tablet Take 1 tablet (10 mEq total) by mouth daily. Take as needed only when you take your lasix  01/22/23 02/09/24  Goodrich, Callie E, PA-C  pregabalin  (LYRICA ) 25 MG capsule Take by mouth in the morning and at bedtime. 03/08/23   [provider]  Respiratory Therapy Supplies (FLUTTER) DEVI Use as directed 03/16/17   Alaine Vicenta NOVAK, MD  Spacer/Aero-Holding Chambers (OPTICHAMBER DIAMOND ) MISC optichamber [facility] 09/12/19   Olalere, Adewale A, MD  spironolactone  (ALDACTONE ) 25 MG tablet Take 0.5 tablets (12.5 mg total) by mouth daily. 11/02/22   Emelia Josefa HERO, NP  STIOLTO RESPIMAT 2.5-2.5 MCG/ACT AERS Inhale 2 puffs into the lungs daily. 09/22/21   [provider]  tamsulosin  (FLOMAX ) 0.4 MG CAPS capsule TAKE ONE CAPSULE BY MOUTH DAILY AFTER SUPPER 06/30/21   Fargo, Amy E, NP  traMADol  (ULTRAM ) 50 MG tablet Take 50 mg by mouth every 12 (twelve) hours as needed for moderate pain (pain score 4-6) or severe pain (pain score 7-10).  11/04/23   [provider]    Allergies: Sulfa antibiotics and Cephalexin     Review of Systems  Updated Vital Signs BP 128/79 (BP Location: Right Arm)   Pulse 98   Temp 98.7 F (37.1 C) (Oral)   Resp (!) 26   Ht 5' 11 (1.803 m)   Wt 66.2 kg   SpO2 96%   BMI 20.36 kg/m   Physical Exam Vitals and nursing note reviewed.  Constitutional:      General: He is not in acute distress. HENT:     Head: Normocephalic and atraumatic.     Nose: Nose normal.  Eyes:     General: No scleral icterus. Cardiovascular:     Rate and Rhythm: Normal rate and regular rhythm.     Pulses: Normal pulses.     Heart sounds: Normal heart sounds.  Pulmonary:     Effort: Pulmonary effort is normal. No respiratory distress.     Breath sounds: No wheezing.     Comments: Faint crackles in bilateral bases, good air movement, speaking in full sentences Abdominal:     Palpations: Abdomen is soft.     Tenderness: There is no abdominal tenderness.  Musculoskeletal:     Cervical back: Normal range of motion.     Right lower leg: Edema present.     Left lower leg: Edema present.  Skin:    General: Skin is warm and dry.     Capillary Refill: Capillary refill takes less than 2 seconds.  Neurological:     Mental Status: He is alert. Mental status is at baseline.  Psychiatric:        Mood and Affect: Mood normal.        Behavior: Behavior normal.     (all labs ordered are listed, but only abnormal results are displayed) Labs Reviewed  CBC - Abnormal; Notable for the following components:      Result Value   RBC 3.90 (*)    Hemoglobin 11.3 (*)    HCT 38.2 (*)    MCHC 29.6 (*)    RDW 19.9 (*)    Platelets 133 (*)    All other components within normal limits  I-STAT CHEM 8, ED - Abnormal; Notable for the following components:   Chloride 97 (*)    BUN 7 (*)    Calcium , Ion 1.12 (*)    All other components within normal limits  RESP PANEL BY RT-PCR (RSV, FLU A&B, COVID)  [id number]   COMPREHENSIVE METABOLIC PANEL WITH GFR  URINALYSIS, ROUTINE W REFLEX MICROSCOPIC  BRAIN NATRIURETIC PEPTIDE  CBG MONITORING, ED  TROPONIN I (HIGH SENSITIVITY)    EKG: None  Radiology: DG Chest 2 View Result Date: 03/03/2024 CLINICAL DATA:  ShOB EXAM: CHEST - 2 VIEW COMPARISON:  11/12/2023 FINDINGS: Redemonstrated pulse stimulator device in the right chest with a single lead coursing into the right neck. No focal airspace consolidation, pleural effusion, or pneumothorax. Mild cardiomegaly. Left chest pacemaker/AICD with leads terminating in the right atrium and right ventricle. Tortuous aorta with aortic atherosclerosis. Osteopenia. Multilevel thoracic osteophytosis. IMPRESSION: No acute cardiopulmonary abnormality. Electronically Signed   By: Rogelia Myers M.D.   On: 03/03/2024 15:27     Procedures   Medications Ordered in the ED - No data to display                                  Medical Decision Making Amount and/or Complexity of Data Reviewed Labs: ordered. Radiology: ordered.   Patient is an 83 year old male with a past medical history significant for CAD with stents and RCA and LAD as far back as 1995.  Most recent catheterization in 12/23 with distal occlusion of OM 3.  He is currently on medical therapy.  Has a history of ischemic cardiomyopathy with reduced ejection fraction status post Barostim implantation and ICD-most recent echo was done in May 2025 with EF of 30%.  Has a history of COPD, CKD, pneumonia  Seems that his dry weight is 65.8 kg today is 66.2 kg  He informs me that  over the past 2 days he has noticed that he has been more short of breath he states that he has more dyspnea on exertion and some fatigue and weakness.  He is on Eliquis  for A-fib and states that he has been taking this as prescribed.  Twice daily.  He denies any chest pain nausea vomiting hemoptysis.  He said he coughs occasionally has been coughing up some clear sputum.  Physical exam  with some lower extremity edema, given his history of severe heart failure I have high pretest probability of this acute on chronic heart failure versus viral illness given some sinus congestion and cough.  I-STAT Chem-8 unremarkable CBC with anemia approximately baseline.   Patient care handed off to oncoming team chest x-ray without evidence of pulmonary edema.   Patient care handed off to afternoon team to follow-up on labs and reassess pulmonary status.   Final diagnoses:  Shortness of breath    ED Discharge Orders     None          Neldon Hamp RAMAN, [name] 03/03/24 1537

## 2024-03-03 NOTE — ED Provider Notes (Signed)
°  Physical Exam  BP 115/81 (BP Location: Right Arm)   Pulse 98   Temp 98.3 F (36.8 C) (Oral)   Resp 20   Ht 5' 11 (1.803 m)   Wt 66.2 kg   SpO2 98%   BMI 20.36 kg/m   Physical Exam Vitals and nursing note reviewed.  Constitutional:      General: He is not in acute distress.    Appearance: He is well-developed.  HENT:     Head: Normocephalic and atraumatic.  Eyes:     Conjunctiva/sclera: Conjunctivae normal.  Cardiovascular:     Rate and Rhythm: Normal rate and regular rhythm.     Heart sounds: No murmur heard. Pulmonary:     Effort: Tachypnea present. No respiratory distress.     Breath sounds: Rales (Bilateral lower lung fields) present.  Abdominal:     Palpations: Abdomen is soft.     Tenderness: There is no abdominal tenderness.  Musculoskeletal:        General: No swelling.     Cervical back: Neck supple.     Right lower leg: Edema present.     Left lower leg: Edema present.     Comments: 2+ bilateral lower extremities  Skin:    General: Skin is warm and dry.     Capillary Refill: Capillary refill takes less than 2 seconds.  Neurological:     Mental Status: He is alert.  Psychiatric:        Mood and Affect: Mood normal.     Procedures  Procedures  ED Course / MDM   Clinical Course as of 03/04/24 0005  Fri Mar 03, 2024  1605 S, sig cardiac hx, EF 30% in May, ischemic, p/w SOB for 2 days, exertional. No orthopnea, no sig fluid overloaded. Denies CP. On eliquis  for a fib, low suspicion for PE. Pending trop.  []  trop  []  repeat trop  []  dispo vs admit based on trop and presentation  [BS]  1701 Troponin I (High Sensitivity): 15 Trop has doubled since last visit to 15, will f/u repeat trop [BS]  1913 Patient became pretty dyspneic on attempted walking, and also with desat into the 80s, will place on 2 L at this time primarily for comfort as well as hypoxia.  Discussed with patient, would like admission secondary to worsening SOB.  Has been compliant with his  Eliquis . [BS]    Clinical Course User Index [BS] Arlee Katz, MD   Medical Decision Making Amount and/or Complexity of Data Reviewed Labs: ordered. Decision-making details documented in ED Course. Radiology: ordered.  Risk Prescription drug management. Decision regarding hospitalization.   Your workup today, concerning for HFrEF.  Troponin negative, lower suspicion for ACS.  Known history of ischemic cardiomyopathy with EF of 30%.  Lower extremity pitting edema, follow-up chest x-ray is overall reassuring, patient with significant dyspnea with exertion, and was placed on 2 L for hypoxia.  Will require diuresis, started in the ED.  Discussed with hospitalist team, agreeable to admission with hypoxia, new O2 requirement, and diuresis.       Arlee Katz, MD 03/04/24 MIKI    Lenor Hollering, MD 03/13/24 [phone]

## 2024-03-03 NOTE — ED Notes (Addendum)
 Pt has a Barostim implant so unable to obtain EKG at this time until the stim can be temporarily turned off.

## 2024-03-03 NOTE — ED Triage Notes (Signed)
 Pt BIB [facility] from home. Pt presents with ShOB, DOE and generalized weakness for the past few days. EMS report pt has increased pedal edema and bilateral rales in the lower lobes of the lung.   EMS Vitals  122/70 HR 80 SpO2 92% on RA CBG 113

## 2024-03-03 NOTE — TOC CM/SW Note (Signed)
 TOC consult received for d/c planning needs, possible SNF vs. HH. Follow-up to be completed with patient as appropriate.   Merilee Batty, MSN, RN Case Management [phone]

## 2024-03-03 NOTE — ED Notes (Signed)
 Patient ambulatory to RR with no assistance.

## 2024-03-04 ENCOUNTER — Inpatient Hospital Stay (HOSPITAL_COMMUNITY)

## 2024-03-04 LAB — COMPREHENSIVE METABOLIC PANEL WITH GFR
ALT: 14 U/L (ref 0–44)
AST: 15 U/L (ref 15–41)
Albumin: 2.5 g/dL — ABNORMAL LOW (ref 3.5–5.0)
Alkaline Phosphatase: 45 U/L (ref 38–126)
Anion gap: 9 (ref 5–15)
BUN: 11 mg/dL (ref 8–23)
CO2: 29 mmol/L (ref 22–32)
Calcium: 8.4 mg/dL — ABNORMAL LOW (ref 8.9–10.3)
Chloride: 96 mmol/L — ABNORMAL LOW (ref 98–111)
Creatinine, Ser: 0.65 mg/dL (ref 0.61–1.24)
GFR, Estimated: >60 mL/min (ref >60)
Glucose, Bld: 99 mg/dL (ref 70–99)
Potassium: 3.3 mmol/L — ABNORMAL LOW (ref 3.5–5.1)
Sodium: 134 mmol/L — ABNORMAL LOW (ref 135–145)
Total Bilirubin: 0.8 mg/dL (ref 0.0–1.2)
Total Protein: 5.9 g/dL — ABNORMAL LOW (ref 6.5–8.1)

## 2024-03-04 LAB — ECHOCARDIOGRAM COMPLETE
AR max vel: 3.36 cm2
AV Peak grad: 6.6 mmHg
Ao pk vel: 1.28 m/s
Height: 71 in
S' Lateral: 4.5 cm
Weight: 2335.99 [oz_av]

## 2024-03-04 LAB — CBC
HCT: 33.8 % — ABNORMAL LOW (ref 39.0–52.0)
Hemoglobin: 10.4 g/dL — ABNORMAL LOW (ref 13.0–17.0)
MCH: 29.1 pg (ref 26.0–34.0)
MCHC: 30.8 g/dL (ref 30.0–36.0)
MCV: 94.7 fL (ref 80.0–100.0)
Platelets: 125 K/uL — ABNORMAL LOW (ref 150–400)
RBC: 3.57 MIL/uL — ABNORMAL LOW (ref 4.22–5.81)
RDW: 19.1 % — ABNORMAL HIGH (ref 11.5–15.5)
WBC: 6.2 K/uL (ref 4.0–10.5)
nRBC: 0.3 % — ABNORMAL HIGH (ref 0.0–0.2)

## 2024-03-04 MED ORDER — POTASSIUM CHLORIDE CRYS ER 20 MEQ PO TBCR
40.0000 meq | EXTENDED_RELEASE_TABLET | Freq: Two times a day (BID) | ORAL | Status: AC
  Administered 2024-03-04 (×2): 40 meq via ORAL
  Filled 2024-03-04 (×2): qty 2

## 2024-03-04 MED ORDER — FUROSEMIDE 40 MG PO TABS
40.0000 mg | ORAL_TABLET | Freq: Every day | ORAL | Status: DC
  Administered 2024-03-04: 40 mg via ORAL
  Filled 2024-03-04 (×2): qty 1

## 2024-03-04 NOTE — Evaluation (Signed)
 Clinical/Bedside Swallow Evaluation Patient Details  Name: Steven Guzman MRN: [id number] Date of Birth: [birth year]  Today's Date: 03/04/2024 Time: SLP Start Time (ACUTE ONLY): 9164 SLP Stop Time (ACUTE ONLY): 0900 SLP Time Calculation (min) (ACUTE ONLY): 25 min  Past Medical History:  Past Medical History:  Diagnosis Date   AICD (automatic cardioverter/defibrillator) present    [facility] [employer] MOMENTUM EL ICD D121/ [phone]   Aneurysm    a. Aneurysmal infrarenal aorta up to 33 mm on CT 10/2014, recommended f/u due 10/2017   Anginal pain    Anxiety    Arthritis    right foot   Basal cell carcinoma of nose    S/P MOHS   Biliary acute pancreatitis    CAD (coronary artery disease)    a. s/p MI in 1994 with PCI to LAD at that time b. cath 10/2012 demonstrated EF 30%, inferior akinesis with mild hypokinesis of all walls, patent LAD and RCA stents; ostial PDA with 80-90% obstruction with medical therapy recommended    Chronic systolic CHF (congestive heart failure) (HCC)    EF 30 to 35 % as of 09/2014.    CKD (chronic kidney disease), stage III (HCC)    Complication of anesthesia 10/2014   had to have defibrillator w/ERCP- CODED after having gallstones removed   COPD (chronic obstructive pulmonary disease) (HCC)    a. followed by pulmonary, COPD GOLD stage II   Depression    Diverticulosis of colon 07/2014   noted on CT   Dyspnea    with exertion   GERD (gastroesophageal reflux disease)    Hiatal hernia    large   History of kidney stones    passed stone   Hyperglycemia 10/2012   Hyperlipidemia    Hypertension    Myocardial infarction [facility]) 1994; 2011   Pneumonia 1946; 2015   Prostate enlargement 07/2014   observed on CT   Tobacco abuse    Ventricular tachycardia (HCC)    a. 08/2009 s/p BSX E110 Teligen 100 AICD, ser#: [id number];  b. 08/2008 VT req ATP - detection reprogrammed from 160 to 150. c. EPS and VT ablation by Dr. Waddell 12/21/2014   Past Surgical History:  Past  Surgical History:  Procedure Laterality Date   BIOPSY  12/21/2017   Procedure: BIOPSY;  Surgeon: Abran Norleen SAILOR, MD;  Location: [facility];  Service: Endoscopy;;   CATARACT EXTRACTION W/ INTRAOCULAR LENS  IMPLANT, BILATERAL Bilateral ~ 2011   COLONOSCOPY     COLONOSCOPY WITH PROPOFOL  N/A 12/21/2017   Procedure: COLONOSCOPY WITH PROPOFOL ;  Surgeon: Abran Norleen SAILOR, MD;  Location: [facility];  Service: Endoscopy;  Laterality: N/A;   ELECTROPHYSIOLOGIC STUDY N/A 12/21/2014   Procedure: V Tach Ablation;  Surgeon: Danelle LELON Waddell, MD;  Location: [facility] INVASIVE CV LAB;  Service: Cardiovascular;  Laterality: N/A;   ERCP N/A 11/16/2014   Procedure: ENDOSCOPIC RETROGRADE CHOLANGIOPANCREATOGRAPHY (ERCP);  Surgeon: Lamar JONETTA Aho, MD;  Location: [facility] ENDOSCOPY;  Service: Endoscopy;  Laterality: N/A;   ESOPHAGOGASTRODUODENOSCOPY (EGD) WITH PROPOFOL  N/A 12/21/2017   Procedure: ESOPHAGOGASTRODUODENOSCOPY (EGD) WITH PROPOFOL ;  Surgeon: Abran Norleen SAILOR, MD;  Location: [facility];  Service: Endoscopy;  Laterality: N/A;   EYE SURGERY     FOOT SURGERY Left 2005   fixed bone that stuck out in my ankle area   HEMORRHOID BANDING     ICD GENERATOR CHANGEOUT N/A 04/20/2022   Procedure: ICD GENERATOR CHANGEOUT;  Surgeon: Waddell Danelle LELON, MD;  Location: [facility] INVASIVE CV LAB;  Service: Cardiovascular;  Laterality: N/A;   IMPLANTABLE CARDIOVERTER DEFIBRILLATOR IMPLANT  09/06/09   BSX dual chamber ICD implanted in [place]  for cardiac arrest 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   INGUINAL HERNIA REPAIR Right ~ 1995   INGUINAL HERNIA REPAIR Left 04/10/2022   Procedure: HERNIA REPAIR INGUINAL ADULT;  Surgeon: Ebbie Cough, MD;  Location: [facility] OR;  Service: General;  Laterality: Left;   LEFT HEART CATH AND CORONARY ANGIOGRAPHY N/A 03/19/2022   Procedure: LEFT HEART CATH AND CORONARY ANGIOGRAPHY;  Surgeon: Jordan, Peter M, MD;  Location: [facility] INVASIVE CV LAB;  Service: Cardiovascular;  Laterality: N/A;   LEFT HEART CATHETERIZATION WITH CORONARY  ANGIOGRAM N/A 11/25/2012   demonstrated EF 30%, inferior akinesis with mild hypokinesis of all walls, patent LAD and RCA stents; ostial PDA with 80-90% obstruction with medical therapy recommended   MOHS SURGERY  2008   nose, skin graft   POLYPECTOMY  12/21/2017   Procedure: POLYPECTOMY;  Surgeon: Abran Norleen SAILOR, MD;  Location: [facility];  Service: Endoscopy;;   RETINAL DETACHMENT SURGERY Right 2013   TENOLYSIS Right 12/21/2013   Procedure: TENOLYSIS FLEXOR CARPI RADIALIS ,DEBRIDEMENT RIGHT JOINT WRIST,DEBRIDEMENT SCAPHOTRAPEZIAL TRAPEZOID, REPAIR OF EXTENSOR HOOD;  Surgeon: Arley Curia, MD;  Location: [facility];  Service: Orthopedics;  Laterality: Right;   TOE SURGERY Right 09/2019   3rd toe/hammer toe   V-TACH ABLATION  12/21/2014   VIDEO BRONCHOSCOPY Bilateral 01/09/2016   Procedure: VIDEO BRONCHOSCOPY WITHOUT FLUORO;  Surgeon: Vicenta KATHEE Lennert, MD;  Location: [facility];  Service: Cardiopulmonary;  Laterality: Bilateral;   HPI:  Pt is a 83 y.o. male presented to ED with multiple complaints including SOB, bilateral lower extremities worsening edema, generalized fatigue for last 2 to 3 days. CXR (03/03/24) neg for acute cardiopulmonary abnormality. 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 MD office visit note on 10/04/23 mentions, Next concern is some difficulty with swallowing. Mentioned this to his pulmonologist recently since he complained of difficulty eating and breathing. Concerned that sometimes he feels that food may get caught up near his vocal cords. PMH: CAD status post stent placement, HFrEF 30 to 35%, ischemic cardiomyopathy, ventricular tachycardia status post [employer] dual chamber ICD 2010 and status post ablation 2016, paroxysmal atrial fibrillation on Eliquis , essential hypertension, hyperlipidemia, chronic thrombocytopenia, former smoker, ascending thoracic aneurysm and infrarenal abdominal aortic aneurysm, chronic pain  syndrome, major depressive disorder, chronic iron deficiency, GERD, hiatal hernia. and reactive airway disease.    Assessment / Plan / Recommendation  Clinical Impression  Pt presents with functional oropharyngeal swallow at bedside this am. He reports some difficulty with breathing/swallow coordination within the past year. He also c/o inconsistent burning sensation in his throat when he eats. He denies acid reflux, that it is managed well with meds.  Oral mechanism examination was [facility]. He has full natural lower dentition, several upper dentition are missing and plate not present during eval. Vocal quality was hoarse and somewhat breathy at times. He self-fed thin liquids via straw (3oz water swallow challenge included), puree, regular graham cracker and fresh pineapple from meal tray without overt s/sx of aspiration. He c/o mild pharyngeal burning with pineapple consumption. x1 instance of SOB noted, with pt taking a short break prior to resuming POs. No other difficulties noted with coordination of breathing for safe swallow function this am.         PLAN: Recommend continue regular diet/thin liquids as pt is careful to choose softer meal items as needed due to lacking  dentition. Educated pt regarding aspiration/esophageal precautions and encouraged him to continue taking his time when eating and taking breaks to breath as needed. ENT f/u may need to be considered due to vocal quality and reports of burning sensation in throat without obvious reflux. No SLP f/u recommended at this time and will s/o.  SLP Visit Diagnosis: Dysphagia, unspecified (R13.10)    Aspiration Risk       Diet Recommendation           Other Recommendations Oral Care Recommendations: Oral care BID     Swallow Evaluation Recommendations Recommendations: PO diet PO Diet Recommendation: Regular;Thin liquids (Level 0) Liquid Administration via: Cup;Straw Medication Administration: Whole meds with liquid Supervision: Patient  able to self-feed Swallowing strategies  : Minimize environmental distractions;Slow rate;Small bites/sips Postural changes: Position pt fully upright for meals;Stay upright 30-60 min after meals Oral care recommendations: Oral care BID (2x/day) Recommended consults: Consider ENT consultation   Assistance Recommended at Discharge    Functional Status Assessment Patient has not had a recent decline in their functional status  Frequency and Duration            Prognosis        Swallow Study   General Date of Onset: 03/04/24 HPI: Pt is a 83 y.o. male presented to ED with multiple complaints including SOB, bilateral lower extremities worsening edema, generalized fatigue for last 2 to 3 days. CXR (03/03/24) neg for acute cardiopulmonary abnormality. 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 MD office visit note on 10/04/23 mentions, Next concern is some difficulty with swallowing. Mentioned this to his pulmonologist recently since he complained of difficulty eating and breathing. Concerned that sometimes he feels that food may get caught up near his vocal cords. PMH: CAD status post stent placement, HFrEF 30 to 35%, ischemic cardiomyopathy, ventricular tachycardia status post [employer] dual chamber ICD 2010 and status post ablation 2016, paroxysmal atrial fibrillation on Eliquis , essential hypertension, hyperlipidemia, chronic thrombocytopenia, former smoker, ascending thoracic aneurysm and infrarenal abdominal aortic aneurysm, chronic pain syndrome, major depressive disorder, chronic iron deficiency, GERD, hiatal hernia. and reactive airway disease. Type of Study: Bedside Swallow Evaluation Previous Swallow Assessment: none per EMR Diet Prior to this Study: Regular;Thin liquids (Level 0) Temperature Spikes Noted: No Respiratory Status: Nasal cannula History of Recent Intubation: No Behavior/Cognition: Alert;Cooperative;Pleasant mood Oral Cavity  Assessment: Within Functional Limits Oral Care Completed by SLP: No Oral Cavity - Dentition: Missing dentition;Dentures, not available Vision: Functional for self-feeding Self-Feeding Abilities: Able to feed self Patient Positioning: Upright in bed;Postural control adequate for testing Baseline Vocal Quality: Hoarse Volitional Cough: Strong Volitional Swallow: Able to elicit    Oral/Motor/Sensory Function Overall Oral Motor/Sensory Function: Within functional limits   Ice Chips Ice chips: Not tested   Thin Liquid Thin Liquid: Within functional limits Presentation: Self Fed;Straw    Nectar Thick Nectar Thick Liquid: Not tested   Honey Thick Honey Thick Liquid: Not tested   Puree Puree: Within functional limits Presentation: Self Fed;Spoon   Solid     Solid: Within functional limits Presentation: Self Fed       Wilder Kin, MA, CCC-SLP Acute Rehabilitation Services Office Number: [phone]  Wilder KANDICE Kin 03/04/2024,9:20 AM

## 2024-03-04 NOTE — Progress Notes (Signed)
 Mobility Specialist Progress Note:  Nurse requested Mobility Specialist to perform oxygen  saturation test with pt which includes removing pt from oxygen  both at rest and while ambulating.  Below are the results from that testing.     Patient Saturations on Room Air at Rest = spO2 86% O2 flow increased to 2L/min to sustain SPO2 above 94%  Patient Saturations on Room Air while Ambulating = sp02 N/A% .   Patient Saturations on 4 Liters of oxygen  while Ambulating = sp02 95%  At end of testing pt left in room on 2  Liters of oxygen .  Reported results to nurse.    Thersia Minder Mobility Specialist  Please contact vis Secure Chat or  Rehab Office [phone]

## 2024-03-04 NOTE — Progress Notes (Signed)
 Mobility Specialist Progress Note:    03/04/24 1100  Mobility  Activity Ambulated with assistance  Level of Assistance Contact guard assist, steadying assist  Assistive Device Cane  Distance Ambulated (ft) 80 ft  Activity Response Tolerated well  Mobility Referral Yes  Mobility visit 1 Mobility  Mobility Specialist Start Time (ACUTE ONLY) 1030  Mobility Specialist Stop Time (ACUTE ONLY) 1044  Mobility Specialist Time Calculation (min) (ACUTE ONLY) 14 min   Pt received seated EOB agreeable to mobility. Was able to sustain SPO2 WNL at 2L/min when ambulating pt required 4L/min to keep SPO2 WNL. Pt had x2 LOB requiring MinA from MS. Returned to room w/o fault. Left in bed w/ call bell and personal belongings in reach. All needs met. Bed alarm on RN aware.  Thersia Minder Mobility Specialist  Please contact vis Secure Chat or  Rehab Office [phone]

## 2024-03-04 NOTE — Progress Notes (Signed)
 PROGRESS NOTE    Steven Guzman  FMW:[id number] DOB: 22-Aug-1940 DOA: 03/03/2024 PCP: Esmeralda Morton SAUNDERS, PA-C  83/M with history of CAD, chronic systolic CHF, ischemic cardiomyopathy, history of VT, ICD, paroxysmal A-fib, hypertension, chronic thrombocytopenia, former smoker, thoracic and abdominal aneurysms, chronic pain, iron deficiency anemia, reactive airway disease presented to the ED with worsening shortness of breath and edema, fatigue for few days.  In the ER he was noted to be mildly hypoxic, lab notable for BNP 267, CMP was unremarkable, chest x-ray with no acute findings, clinically noted to be volume overloaded given IV Lasix  and admitted   Subjective: Feels better overall, breathing is improving  Assessment and Plan:  Acute hypoxic respiratory failure Acute on chronic systolic CHF -Last echo noted EF 30-35% with wall motion abnormality, normal RV -Improved with diuresis, clinically appears close to euvolemic, switch to oral diuretics today -Continue home regimen of Aldactone , Jardiance , losartan  - Attempt to wean O2  History of PACs and atrial fibrillation History of ventricular tachycardia status post ICD  -Currently in sinus rhythm -Continue Eliquis  5 mg twice daily, mexiletine 200 mg twice daily, amiodarone  200 mg daily and Coreg  3.125 mg twice daily.   History of CAD -Continue Coreg , Eliquis , and aspirin .  At home patient is on  Repatha  every 2 weeks.   Reactive airway disease -Continue Anoro Ellipta ,  Atrovent  as needed.   History of GERD -Continue Protonix    History of major depressive disorder -Continue Lexapro    History of AAA Former smoker Per chart review CT angio chest 01/2022 thoracic aneurysm measuring 4.5 cm.  Patient with need annual imaging to follow-up in outpatient.   History of BPH Continue Flomax    GERD -Continue Protonix    Insomnia - Continue melatonin as needed at bedtime   Peripheral neuropathy - Continue Lyrica  at bedtime  DVT  prophylaxis: Eliquis  Code Status: Full Family Communication: None present Disposition Plan: Home likely tomorrow  Consultants:    Procedures:   Antimicrobials:    Objective: Vitals:   03/04/24 0413 03/04/24 0728 03/04/24 0818 03/04/24 0830  BP:  101/69  101/69  Pulse:    98  Resp:  (!) 25    Temp:      TempSrc:      SpO2:   93%   Weight: 66.2 kg     Height:        Intake/Output Summary (Last 24 hours) at 03/04/2024 0954 Last data filed at 03/04/2024 0600 Gross per 24 hour  Intake 3 ml  Output 1950 ml  Net -1947 ml   Filed Weights   03/03/24 1412 03/04/24 0413  Weight: 66.2 kg 66.2 kg    Examination:  General exam: Appears calm and comfortable, AO x 3 HEENT: No JVD Respiratory system: Clear to auscultation poor air movement Cardiovascular system: S1 & S2 heard, RRR.  Abd: nondistended, soft and nontender.Normal bowel sounds heard. Central nervous system: Alert and oriented. No focal neurological deficits. Extremities: Trace edema edema Skin: No rashes Psychiatry:  Mood & affect appropriate.     Data Reviewed:   CBC: Recent Labs  Lab 03/03/24 1414 03/03/24 1427 03/04/24 0210  WBC 6.8  --  6.2  HGB 11.3* 13.6 10.4*  HCT 38.2* 40.0 33.8*  MCV 97.9  --  94.7  PLT 133*  --  125*   Basic Metabolic Panel: Recent Labs  Lab 03/03/24 1414 03/03/24 1427 03/04/24 0210  NA 136 137 134*  K 3.9 4.0 3.3*  CL 96* 97* 96*  CO2 29  --  29  GLUCOSE 103* 98 99  BUN 7* 7* 11  CREATININE 0.64 0.70 0.65  CALCIUM  8.6*  --  8.4*   GFR: Estimated Creatinine Clearance: 63.2 mL/min (by C-G formula based on SCr of 0.65 mg/dL). Liver Function Tests: Recent Labs  Lab 03/03/24 1414 03/04/24 0210  AST 18 15  ALT 14 14  ALKPHOS 48 45  BILITOT 1.1 0.8  PROT 6.5 5.9*  ALBUMIN 2.8* 2.5*   No results for input(s): LIPASE, AMYLASE in the last 168 hours. No results for input(s): AMMONIA in the last 168 hours. Coagulation Profile: No results for input(s):  INR, PROTIME in the last 168 hours. Cardiac Enzymes: No results for input(s): CKTOTAL, CKMB, CKMBINDEX, TROPONINI in the last 168 hours. BNP (last 3 results) No results for input(s): PROBNP in the last 8760 hours. HbA1C: No results for input(s): HGBA1C in the last 72 hours. CBG: Recent Labs  Lab 03/03/24 1427  GLUCAP 96   Lipid Profile: No results for input(s): CHOL, HDL, LDLCALC, TRIG, CHOLHDL, LDLDIRECT in the last 72 hours. Thyroid  Function Tests: No results for input(s): TSH, T4TOTAL, FREET4, T3FREE, THYROIDAB in the last 72 hours. Anemia Panel: No results for input(s): VITAMINB12, FOLATE, FERRITIN, TIBC, IRON, RETICCTPCT in the last 72 hours. Urine analysis:    Component Value Date/Time   COLORURINE YELLOW 03/03/2024 1415   APPEARANCEUR CLEAR 03/03/2024 1415   LABSPEC 1.015 03/03/2024 1415   PHURINE 7.0 03/03/2024 1415   GLUCOSEU >=500 (A) 03/03/2024 1415   GLUCOSEU NEGATIVE 05/24/2015 1007   HGBUR NEGATIVE 03/03/2024 1415   BILIRUBINUR NEGATIVE 03/03/2024 1415   BILIRUBINUR Negative 12/11/2021 1529   KETONESUR NEGATIVE 03/03/2024 1415   PROTEINUR NEGATIVE 03/03/2024 1415   UROBILINOGEN 0.2 12/11/2021 1529   UROBILINOGEN 1.0 05/24/2015 1007   NITRITE NEGATIVE 03/03/2024 1415   LEUKOCYTESUR NEGATIVE 03/03/2024 1415   Sepsis Labs: @LABRCNTIP (procalcitonin:4,lacticidven:4)  ) Recent Results (from the past 240 hours)  Resp panel by RT-PCR (RSV, Flu A&B, Covid) Anterior Nasal Swab     Status: None   Collection Time: 03/03/24  2:33 PM   Specimen: Anterior Nasal Swab  Result Value Ref Range Status   SARS Coronavirus 2 by RT PCR NEGATIVE NEGATIVE Final   Influenza A by PCR NEGATIVE NEGATIVE Final   Influenza B by PCR NEGATIVE NEGATIVE Final    Comment: (NOTE) The Xpert Xpress SARS-CoV-2/FLU/RSV plus assay is intended as an aid in the diagnosis of influenza from Nasopharyngeal swab specimens and should not be used as a  sole basis for treatment. Nasal washings and aspirates are unacceptable for Xpert Xpress SARS-CoV-2/FLU/RSV testing.  Fact Sheet for Patients: bloggercourse.com  Fact Sheet for Healthcare Providers: seriousbroker.it  This test is not yet approved or cleared by the [place]  FDA and has been authorized for detection and/or diagnosis of SARS-CoV-2 by FDA under an Emergency Use Authorization (EUA). This EUA will remain in effect (meaning this test can be used) for the duration of the COVID-19 declaration under Section 564(b)(1) of the Act, 21 U.S.C. section 360bbb-3(b)(1), unless the authorization is terminated or revoked.     Resp Syncytial Virus by PCR NEGATIVE NEGATIVE Final    Comment: (NOTE) Fact Sheet for Patients: bloggercourse.com  Fact Sheet for Healthcare Providers: seriousbroker.it  This test is not yet approved or cleared by the [place]  FDA and has been authorized for detection and/or diagnosis of SARS-CoV-2 by FDA under an Emergency Use Authorization (EUA). This EUA will remain in effect (meaning this test can be used) for the duration  of the COVID-19 declaration under Section 564(b)(1) of the Act, 21 U.S.C. section 360bbb-3(b)(1), unless the authorization is terminated or revoked.  Performed at [facility], [address], [place], [place] [address]      Radiology Studies: DG Chest 2 View Result Date: 03/03/2024 CLINICAL DATA:  ShOB EXAM: CHEST - 2 VIEW COMPARISON:  11/12/2023 FINDINGS: Redemonstrated pulse stimulator device in the right chest with a single lead coursing into the right neck. No focal airspace consolidation, pleural effusion, or pneumothorax. Mild cardiomegaly. Left chest pacemaker/AICD with leads terminating in the right atrium and right ventricle. Tortuous aorta with aortic atherosclerosis. Osteopenia. Multilevel thoracic  osteophytosis. IMPRESSION: No acute cardiopulmonary abnormality. Electronically Signed   By: Rogelia Myers M.D.   On: 03/03/2024 15:27     Scheduled Meds:  amiodarone   200 mg Oral Daily   apixaban   5 mg Oral BID   aspirin  EC  81 mg Oral Daily   calcium -vitamin D   1 tablet Oral Daily   carvedilol   3.125 mg Oral BID   empagliflozin   10 mg Oral Daily   escitalopram   20 mg Oral Daily   furosemide   40 mg Oral Daily   guaiFENesin   600 mg Oral BID   losartan   12.5 mg Oral Daily   mexiletine  200 mg Oral BID   multivitamin with minerals  1 tablet Oral Daily   pantoprazole   40 mg Oral Daily   potassium chloride   40 mEq Oral BID   pregabalin   75 mg Oral Daily   sodium chloride  flush  3 mL Intravenous Q12H   sodium chloride  flush  3 mL Intravenous Q12H   spironolactone   12.5 mg Oral Daily   tamsulosin   0.4 mg Oral QPC supper   umeclidinium-vilanterol  1 puff Inhalation Daily   Continuous Infusions:  sodium chloride        LOS: 1 day    Time spent:    Sigurd Pac, MD [facility]   03/04/2024, 9:54 AM

## 2024-03-05 LAB — BASIC METABOLIC PANEL WITH GFR
Anion gap: 9 (ref 5–15)
BUN: 12 mg/dL (ref 8–23)
CO2: 31 mmol/L (ref 22–32)
Calcium: 8.4 mg/dL — ABNORMAL LOW (ref 8.9–10.3)
Chloride: 94 mmol/L — ABNORMAL LOW (ref 98–111)
Creatinine, Ser: 0.68 mg/dL (ref 0.61–1.24)
GFR, Estimated: >60 mL/min (ref >60)
Glucose, Bld: 104 mg/dL — ABNORMAL HIGH (ref 70–99)
Potassium: 4 mmol/L (ref 3.5–5.1)
Sodium: 134 mmol/L — ABNORMAL LOW (ref 135–145)

## 2024-03-05 LAB — MAGNESIUM: Magnesium: 1.7 mg/dL (ref 1.7–2.4)

## 2024-03-05 MED ORDER — POTASSIUM CHLORIDE ER 20 MEQ PO TBCR: 20.0000 meq | EXTENDED_RELEASE_TABLET | Freq: Every day | ORAL | 1 refills | Status: DC

## 2024-03-05 MED ORDER — FUROSEMIDE 40 MG PO TABS: 40.0000 mg | ORAL_TABLET | Freq: Every day | ORAL | 3 refills | Status: DC

## 2024-03-05 NOTE — TOC Transition Note (Signed)
 Transition of Care [facility]) - Discharge Note   Patient Details  Name: Steven Guzman MRN: [id number] Date of Birth: Jul 08, 1940  [facility] of [facility]) CM/SW Contact:  Marval Gell, RN Phone Number: 03/05/2024, 10:40 AM   Clinical Narrative:     Spoke w patient at bedside. He would like HH services, has used [facility] in the past and would like to use them again. Spoke w Burnard at [facility], she states that she may not hear back from [place] branch today, if she receives a decline from them tomorrow she has RN CM number for 3E to follow up with.  Patient will need home oxygen , we spoke about DME companies and [employer] will deliver oxygen  for ride home and he is instructed to call them when he gets home to set up home equipment, added to AVS. Discussed smoking cessation, he states he plans on no longer smoking, and understands the dangers of smoking with oxygen . He has a nephew that will provide transportation home.  He plans on following up with the [facility] for additional help at home for his wife and himself.      Barriers to Discharge: No Barriers Identified   Patient Goals and CMS Choice Patient states their goals for this hospitalization and ongoing recovery are:: to go home CMS Medicare.gov Compare Post Acute Care list provided to:: Patient Choice offered to / list presented to : Patient      Discharge Placement                       Discharge Plan and Services Additional resources added to the After Visit Summary for     Discharge Planning Services: CM Consult Post Acute Care Choice: Durable Medical Equipment, Home Health          DME Arranged: Oxygen  DME Agency: [employer] Date DME Agency Contacted: 03/05/24 Time DME Agency Contacted: 1029 Representative spoke with at DME Agency: [place] HH Arranged: PT, RN HH Agency: [facility] Date  [facility] Agency Contacted: 03/05/24 Time HH Agency Contacted: 1040 Representative spoke with at [facility]  Agency: [facility]  Social Drivers of Health (SDOH) Interventions SDOH Screenings   Food Insecurity: No Food Insecurity (03/04/2024)  Housing: Low Risk  (03/04/2024)  Transportation Needs: No Transportation Needs (03/04/2024)  Utilities: Not At Risk (03/04/2024)  Alcohol Screen: Low Risk  (03/06/2021)  Depression (PHQ2-9): Low Risk  (03/12/2022)  Financial Resource Strain: Medium Risk (03/06/2021)  Physical Activity: Insufficiently Active (03/06/2021)  Social Connections: Moderately Isolated (03/04/2024)  Stress: No Stress Concern Present (12/27/2021)   Received from [facility] visits prior to 05/30/2022.  Tobacco Use: High Risk (03/03/2024)     Readmission Risk Interventions     No data to display

## 2024-03-05 NOTE — Progress Notes (Addendum)
 Discharge   Patient expressed verbal understanding of discharge POC.   Patient given time to ask any questions.  Additional education included in AVS.  Alert oriented in good spirits.  No PIV in place.   Pressure dressings intact.  VSS.  Patient has Rogers in place. And oxygen  tank at bedside for home.  No difficulty breathing noted.  Patient reminded to call Dr Jerel Labor (cardiology) for earlier appointment.  Patient states that he will call tomorrow.   Discharging to MAIN A.

## 2024-03-05 NOTE — Progress Notes (Signed)
 Mobility Specialist Progress Note:    03/05/24 0915  Mobility  Activity Ambulated with assistance  Level of Assistance Contact guard assist, steadying assist  Assistive Device Cane  Distance Ambulated (ft) 80 ft  Activity Response Tolerated well  Mobility Referral Yes  Mobility visit 1 Mobility  Mobility Specialist Start Time (ACUTE ONLY) 0900  Mobility Specialist Stop Time (ACUTE ONLY) 0915  Mobility Specialist Time Calculation (min) (ACUTE ONLY) 15 min   Pt received in bed agreeable to mobility. Found to have taken off [place], SPO2 was 80%. O2 flow increased to 2L/min at rest to keep SPO2 above 94%. Required 4L/min to ambulate. Returned to room w/o fault. Left seated Eob w/ call bell and personal belongings in reach. All needs met. Bed alarm on. Left on 2L/min.  Thersia Minder Mobility Specialist  Please contact vis Secure Chat or  Rehab Office [phone]

## 2024-03-05 NOTE — Plan of Care (Signed)

## 2024-03-05 NOTE — Discharge Summary (Signed)
 Physician Discharge Summary  Steven Guzman FMW:[id number] DOB: May 28, 1940 DOA: 03/03/2024  PCP: Esmeralda Morton SAUNDERS, PA-C  Admit date: 03/03/2024 Discharge date: 03/05/2024  Time spent: 45 minutes  Recommendations for Outpatient Follow-up:  PCP in 1 week, home O2 set up Home health PT, RN Cardiology Dr. Francyne in 2 to 3 weeks  Discharge Diagnoses:  Principal Problem:   Acute on chronic combined systolic and diastolic CHF (congestive heart failure) (HCC) Active Problems:   Essential hypertension   AF (atrial fibrillation) (HCC)   Anemia   GERD (gastroesophageal reflux disease)   Major depressive disorder with single episode, in partial remission   History of abdominal aortic aneurysm (AAA)   Chronic idiopathic thrombocytopenia (HCC)   Former smoker   Reactive airway disease   History of BPH   Acute CHF (congestive heart failure) (HCC)   Discharge Condition: Improved  Diet recommendation: Low sodium, heart healthy  Filed Weights   03/03/24 1412 03/04/24 0413 03/05/24 0500  Weight: 66.2 kg 66.2 kg 65.7 kg    History of present illness:  83/M with history of CAD, chronic systolic CHF, ischemic cardiomyopathy, history of VT, ICD, paroxysmal A-fib, hypertension, chronic thrombocytopenia, former smoker, thoracic and abdominal aneurysms, chronic pain, iron deficiency anemia, reactive airway disease presented to the ED with worsening shortness of breath and edema, fatigue for few days.  In the ER he was noted to be mildly hypoxic, lab notable for BNP 267, CMP was unremarkable, chest x-ray with no acute findings, clinically noted to be volume overloaded given IV Lasix  and admitted   Hospital Course:   Acute hypoxic respiratory failure Acute on chronic systolic CHF -Last echo noted EF 30-35% with wall motion abnormality, normal RV -Repeat echo with EF 35-40%, mildly reduced RV -Improved with diuresis, now euvolemic, switch to oral Lasix  40 mg daily, instead of as needed -Continue  home regimen of Aldactone , Jardiance , discontinued losartan  on account of soft/low blood pressures - Not be weaned off O2 despite euvolemia, on account of underlying COPD, set up with home O2 at discharge   History of PACs and atrial fibrillation History of ventricular tachycardia status post ICD  -Currently in sinus rhythm -Continue Eliquis  5 mg twice daily, mexiletine 200 mg twice daily, amiodarone  200 mg daily and Coreg  3.125 mg twice daily.   History of CAD -Continue Coreg , Eliquis , and aspirin .  At home patient is on  Repatha  every 2 weeks.   COPD/long term smoker -Prior CT scans with emphysema -Continue Anoro Ellipta ,  Atrovent  as needed.   History of GERD -Continue Protonix    History of major depressive disorder -Continue Lexapro    History of AAA Former smoker Per chart review CT angio chest 01/2022 thoracic aneurysm measuring 4.5 cm.  Patient with need annual imaging to follow-up in outpatient.   History of BPH Continue Flomax    GERD -Continue Protonix    Insomnia - Continue melatonin as needed at bedtime   Peripheral neuropathy - Continue Lyrica  at bedtime    Discharge Exam: Vitals:   03/05/24 0741 03/05/24 0840  BP:  97/67  Pulse:  76  Resp:  20  Temp:  97.8 F (36.6 C)  SpO2: 94% 96%    Discharge Instructions    Allergies as of 03/05/2024       Reactions   Sulfa Antibiotics Hives   Cephalexin  Itching        Medication List     STOP taking these medications    losartan  25 MG tablet Commonly known as: COZAAR   TAKE these medications    acetaminophen  500 MG tablet Commonly known as: TYLENOL  Take 1,000 mg by mouth every 6 (six) hours as needed for moderate pain.   albuterol  (2.5 MG/3ML) 0.083% nebulizer solution Commonly known as: PROVENTIL  USE 1 VIAL VIA NEBULIZER EVERY 6 HOURS AS NEEDED FOR WHEEZING OR SHORTNESS OF BREATH   amiodarone  200 MG tablet Commonly known as: PACERONE  Take 1 tablet (200 mg total) by mouth daily.  \   aspirin  EC 81 MG tablet Take 1 tablet (81 mg total) by mouth daily. Swallow whole.   B12 LIQUID HEALTH BOOSTER PO Take 5,000 mcg by mouth daily.   Biotin  5000 MCG Caps Take 5,000 mcg by mouth daily.   CALCIUM  + D PO Take 1 tablet by mouth daily.   carvedilol  3.125 MG tablet Commonly known as: COREG  Take 1 tablet (3.125 mg total) by mouth 2 (two) times daily.   CENTRUM ADULTS PO Take 1 tablet by mouth daily.   cetirizine 10 MG tablet Commonly known as: ZYRTEC Take 10 mg by mouth daily.   Cholecalciferol  50 MCG (2000 UT) Tabs Take 2,000 Units by mouth daily at 6 (six) AM.   Combivent  Respimat 20-100 MCG/ACT Aers respimat Generic drug: Ipratropium-Albuterol  Inhale 1 puff into the lungs every 6 (six) hours. Shortness of breath or wheezing   DSS 100 MG Caps Take 2 capsules by mouth daily in the afternoon.   Eliquis  5 MG Tabs tablet Generic drug: apixaban  TAKE 1 TABLET BY MOUTH TWICE A DAY   empagliflozin  10 MG Tabs tablet Commonly known as: JARDIANCE  Take 1 tablet (10 mg total) by mouth daily.   escitalopram  20 MG tablet Commonly known as: LEXAPRO  Take 20 mg by mouth daily.   fluticasone  50 MCG/ACT nasal spray Commonly known as: FLONASE  Place 2 sprays into both nostrils daily.   Flutter Devi Use as directed   furosemide  40 MG tablet Commonly known as: LASIX  Take 1 tablet (40 mg total) by mouth daily. What changed:  when to take this reasons to take this additional instructions   guaiFENesin  600 MG 12 hr tablet Commonly known as: MUCINEX  Take 1 tablet by mouth 2 (two) times daily.   IRON PO Take 1 tablet by mouth daily.   Magnesium  200 MG Tabs Take 400 mg by mouth daily.   melatonin 5 MG Tabs Take 10 mg by mouth at bedtime.   mexiletine 200 MG capsule Commonly known as: MEXITIL  TAKE 1 CAPSULE BY MOUTH 2 TIMES A DAY   mupirocin  ointment 2 % Commonly known as: BACTROBAN  Apply 1 Application topically daily as needed (wound care).    nitroGLYCERIN  0.4 MG SL tablet Commonly known as: NITROSTAT  Place 1 tablet (0.4 mg total) under the tongue every 5 (five) minutes as needed for chest pain. DISSOLVE 1 TABLET UNDER THE TONGUE EVERY 5 MINUTES FOR 3 DOSES   omeprazole 20 MG capsule Commonly known as: PRILOSEC Take 20 mg by mouth daily.   optichamber diamond  Misc optichamber VHC   Potassium Chloride  ER 20 MEQ Tbcr Take 1 tablet (20 mEq total) by mouth daily. What changed:  medication strength how much to take additional instructions   pregabalin  25 MG capsule Commonly known as: LYRICA  Take by mouth in the morning and at bedtime.   Repatha  SureClick 140 MG/ML Soaj Generic drug: Evolocumab  INJECT 1 ML INTO THE SKIN EVERY 14 DAYS   SOOTHE XP OP Place 2 drops into both eyes daily as needed (dry eyes).   spironolactone  25 MG tablet Commonly known  as: ALDACTONE  Take 0.5 tablets (12.5 mg total) by mouth daily.   Stiolto Respimat 2.5-2.5 MCG/ACT Aers Generic drug: Tiotropium Bromide -Olodaterol Inhale 2 puffs into the lungs daily.   tamsulosin  0.4 MG Caps capsule Commonly known as: FLOMAX  TAKE ONE CAPSULE BY MOUTH DAILY AFTER SUPPER   traMADol  50 MG tablet Commonly known as: ULTRAM  Take 50 mg by mouth every 12 (twelve) hours as needed for moderate pain (pain score 4-6) or severe pain (pain score 7-10).   vitamin C 1000 MG tablet Take 1,000 mg by mouth daily.               Durable Medical Equipment  (From admission, onward)           Start     Ordered   03/05/24 0731  For home use only DME oxygen   Once       Question Answer Comment  Length of Need 6 Months   Mode or (Route) Nasal cannula   Liters per Minute 3   Oxygen  conserving device Yes   Oxygen  delivery system: Gas      03/05/24 0731           Allergies  Allergen Reactions   Sulfa Antibiotics Hives   Cephalexin  Itching      The results of significant diagnostics from this hospitalization (including imaging, microbiology,  ancillary and laboratory) are listed below for reference.    Significant Diagnostic Studies: ECHOCARDIOGRAM COMPLETE Result Date: 03/04/2024    ECHOCARDIOGRAM REPORT   Patient Name:   GRAESYN SCHREIFELS Date of Exam: 03/04/2024 Medical Rec #:  [id number]        Height:       71.0 in Accession #:    [id number]       Weight:       146.0 lb Date of Birth:  1940/05/06        BSA:          1.845 m Patient Age:    83 years         BP:           76/64 mmHg Patient Gender: M                HR:           85 bpm. Exam Location:  Inpatient Procedure: 2D Echo, Cardiac Doppler and Color Doppler (Both Spectral and Color            Flow Doppler were utilized during procedure). Indications:    CHF- Acute Systolic  History:        Patient has prior history of Echocardiogram examinations, most                 recent 08/11/2023. CHF, CAD, COPD, Arrythmias:Atrial                 Fibrillation; Risk Factors:Hypertension and Dyslipidemia.  Sonographer:    Sherlean Dubin Referring Phys: [id number] SUBRINA SUNDIL  Sonographer Comments: No subcostal window. Image acquisition challenging due to patient body habitus and Image acquisition challenging due to respiratory motion. IMPRESSIONS  1. Left ventricular ejection fraction, by estimation, is 35 to 40%. The left ventricle has moderately decreased function. Left ventricular endocardial border not optimally defined to evaluate regional wall motion. Left ventricular diastolic function could not be evaluated.  2. Right ventricular systolic function is mildly reduced. The right ventricular size is normal.  3. Right atrial size was mildly dilated.  4. The mitral valve is normal in structure. Trivial  mitral valve regurgitation. No evidence of mitral stenosis.  5. The aortic valve is calcified. There is severe calcifcation of the aortic valve. There is severe thickening of the aortic valve. Aortic valve regurgitation is not visualized. Aortic valve sclerosis/calcification is present, without any  evidence of aortic stenosis. Aortic valve Vmax measures 1.28 m/s.  6. Recommend limited study with definity  contrast to assess the LV apex for thrombus and assess focal wall motion. FINDINGS  Left Ventricle: Left ventricular ejection fraction, by estimation, is 35 to 40%. The left ventricle has moderately decreased function. Left ventricular endocardial border not optimally defined to evaluate regional wall motion. The left ventricular internal cavity size was normal in size. There is no left ventricular hypertrophy. Left ventricular diastolic function could not be evaluated. Right Ventricle: The right ventricular size is normal. No increase in right ventricular wall thickness. Right ventricular systolic function is mildly reduced. Left Atrium: Left atrial size was normal in size. Right Atrium: Right atrial size was mildly dilated. Pericardium: There is no evidence of pericardial effusion. Mitral Valve: The mitral valve is normal in structure. Trivial mitral valve regurgitation. No evidence of mitral valve stenosis. Tricuspid Valve: The tricuspid valve is normal in structure. Tricuspid valve regurgitation is not demonstrated. No evidence of tricuspid stenosis. Aortic Valve: The aortic valve is calcified. There is severe calcifcation of the aortic valve. There is severe thickening of the aortic valve. Aortic valve regurgitation is not visualized. Aortic valve sclerosis/calcification is present, without any evidence of aortic stenosis. Aortic valve peak gradient measures 6.6 mmHg. Pulmonic Valve: The pulmonic valve was normal in structure. Pulmonic valve regurgitation is not visualized. No evidence of pulmonic stenosis. Aorta: The aortic root is normal in size and structure. Venous: The inferior vena cava was not well visualized. IAS/Shunts: No atrial level shunt detected by color flow Doppler. Additional Comments: A device lead is visualized.  LEFT VENTRICLE PLAX 2D LVIDd:         5.50 cm   Diastology LVIDs:          4.50 cm   LV e' medial:  9.79 cm/s LV PW:         1.20 cm   LV e' lateral: 7.62 cm/s LV IVS:        1.00 cm LVOT diam:     2.50 cm LV SV:         93 LV SV Index:   51 LVOT Area:     4.91 cm  RIGHT VENTRICLE RV Basal diam:  3.10 cm     PULMONARY VEINS RV Mid diam:    2.80 cm     Diastolic Velocity: 15.70 cm/s RV S prime:     10.30 cm/s  S/D Velocity:       1.30 TAPSE (M-mode): 1.6 cm      Systolic Velocity:  20.00 cm/s LEFT ATRIUM             Index        RIGHT ATRIUM           Index LA Vol (A2C):   55.4 ml 30.03 ml/m  RA Area:     19.60 cm LA Vol (A4C):   47.7 ml 25.86 ml/m  RA Volume:   63.00 ml  34.15 ml/m LA Biplane Vol: 52.1 ml 28.24 ml/m  AORTIC VALVE AV Area (Vmax): 3.36 cm AV Vmax:        128.00 cm/s AV Peak Grad:   6.6 mmHg LVOT Vmax:      87.50  cm/s LVOT Vmean:     61.800 cm/s LVOT VTI:       0.190 m  AORTA Ao Root diam: 3.40 cm Ao Asc diam:  3.10 cm  SHUNTS Systemic VTI:  0.19 m Systemic Diam: 2.50 cm Steven Bihari MD Electronically signed by Steven Bihari MD Signature Date/Time: 03/04/2024/5:17:00 PM    Final    DG Chest 2 View Result Date: 03/03/2024 CLINICAL DATA:  Sh[facility] EXAM: CHEST - 2 VIEW COMPARISON:  11/12/2023 FINDINGS: Redemonstrated pulse stimulator device in the right chest with a single lead coursing into the right neck. No focal airspace consolidation, pleural effusion, or pneumothorax. Mild cardiomegaly. Left chest pacemaker/AICD with leads terminating in the right atrium and right ventricle. Tortuous aorta with aortic atherosclerosis. Osteopenia. Multilevel thoracic osteophytosis. IMPRESSION: No acute cardiopulmonary abnormality. Electronically Signed   By: Rogelia Myers M.D.   On: 03/03/2024 15:27    Microbiology: Recent Results (from the past 240 hours)  Resp panel by RT-PCR (RSV, Flu A&B, Covid) Anterior Nasal Swab     Status: None   Collection Time: 03/03/24  2:33 PM   Specimen: Anterior Nasal Swab  Result Value Ref Range Status   SARS Coronavirus 2 by RT PCR NEGATIVE  NEGATIVE Final   Influenza A by PCR NEGATIVE NEGATIVE Final   Influenza B by PCR NEGATIVE NEGATIVE Final    Comment: (NOTE) The Xpert Xpress SARS-CoV-2/FLU/RSV plus assay is intended as an aid in the diagnosis of influenza from Nasopharyngeal swab specimens and should not be used as a sole basis for treatment. Nasal washings and aspirates are unacceptable for Xpert Xpress SARS-CoV-2/FLU/RSV testing.  Fact Sheet for Patients: bloggercourse.com  Fact Sheet for Healthcare Providers: seriousbroker.it  This test is not yet approved or cleared by the [place]  FDA and has been authorized for detection and/or diagnosis of SARS-CoV-2 by FDA under an Emergency Use Authorization (EUA). This EUA will remain in effect (meaning this test can be used) for the duration of the COVID-19 declaration under Section 564(b)(1) of the Act, 21 U.S.C. section 360bbb-3(b)(1), unless the authorization is terminated or revoked.     Resp Syncytial Virus by PCR NEGATIVE NEGATIVE Final    Comment: (NOTE) Fact Sheet for Patients: bloggercourse.com  Fact Sheet for Healthcare Providers: seriousbroker.it  This test is not yet approved or cleared by the [place]  FDA and has been authorized for detection and/or diagnosis of SARS-CoV-2 by FDA under an Emergency Use Authorization (EUA). This EUA will remain in effect (meaning this test can be used) for the duration of the COVID-19 declaration under Section 564(b)(1) of the Act, 21 U.S.C. section 360bbb-3(b)(1), unless the authorization is terminated or revoked.  Performed at [facility], [address] [place], [place] [address]      Labs: Basic Metabolic Panel: Recent Labs  Lab 03/03/24 1414 03/03/24 1427 03/04/24 0210 03/05/24 0306  NA 136 137 134* 134*  K 3.9 4.0 3.3* 4.0  CL 96* 97* 96* 94*  CO2 29  --  29 31  GLUCOSE 103* 98 99  104*  BUN 7* 7* 11 12  CREATININE 0.64 0.70 0.65 0.68  CALCIUM  8.6*  --  8.4* 8.4*  MG  --   --   --  1.7   Liver Function Tests: Recent Labs  Lab 03/03/24 1414 03/04/24 0210  AST 18 15  ALT 14 14  ALKPHOS 48 45  BILITOT 1.1 0.8  PROT 6.5 5.9*  ALBUMIN 2.8* 2.5*   No results for input(s): LIPASE, AMYLASE  in the last 168 hours. No results for input(s): AMMONIA in the last 168 hours. CBC: Recent Labs  Lab 03/03/24 1414 03/03/24 1427 03/04/24 0210  WBC 6.8  --  6.2  HGB 11.3* 13.6 10.4*  HCT 38.2* 40.0 33.8*  MCV 97.9  --  94.7  PLT 133*  --  125*   Cardiac Enzymes: No results for input(s): CKTOTAL, CKMB, CKMBINDEX, TROPONINI in the last 168 hours. BNP: BNP (last 3 results) Recent Labs    03/03/24 1414  BNP 267.5*    ProBNP (last 3 results) No results for input(s): PROBNP in the last 8760 hours.  CBG: Recent Labs  Lab 03/03/24 1427  GLUCAP 96       Signed:  Sigurd Pac MD.  [facility] 03/05/2024, 9:52 AM

## 2024-03-12 ENCOUNTER — Emergency Department (HOSPITAL_COMMUNITY)

## 2024-03-12 ENCOUNTER — Encounter (HOSPITAL_COMMUNITY): Payer: Self-pay | Admitting: *Deleted

## 2024-03-12 ENCOUNTER — Other Ambulatory Visit: Payer: Self-pay

## 2024-03-12 ENCOUNTER — Inpatient Hospital Stay (HOSPITAL_COMMUNITY)
Admission: EM | Admit: 2024-03-12 | Discharge: 2024-03-24 | DRG: 178 | Disposition: A | Discharge status: Home Health | Attending: Internal Medicine | Admitting: Internal Medicine

## 2024-03-12 DIAGNOSIS — K5900 Constipation, unspecified: Secondary | ICD-10-CM | POA: Diagnosis present

## 2024-03-12 DIAGNOSIS — I714 Abdominal aortic aneurysm, without rupture, unspecified: Secondary | ICD-10-CM | POA: Diagnosis present

## 2024-03-12 DIAGNOSIS — I5042 Chronic combined systolic (congestive) and diastolic (congestive) heart failure: Secondary | ICD-10-CM | POA: Diagnosis present

## 2024-03-12 DIAGNOSIS — I7121 Aneurysm of the ascending aorta, without rupture: Secondary | ICD-10-CM | POA: Diagnosis present

## 2024-03-12 DIAGNOSIS — Z79899 Other long term (current) drug therapy: Secondary | ICD-10-CM | POA: Diagnosis not present

## 2024-03-12 DIAGNOSIS — R918 Other nonspecific abnormal finding of lung field: Secondary | ICD-10-CM

## 2024-03-12 DIAGNOSIS — J189 Pneumonia, unspecified organism: Secondary | ICD-10-CM | POA: Diagnosis not present

## 2024-03-12 DIAGNOSIS — F324 Major depressive disorder, single episode, in partial remission: Secondary | ICD-10-CM | POA: Diagnosis present

## 2024-03-12 DIAGNOSIS — R7303 Prediabetes: Secondary | ICD-10-CM | POA: Diagnosis present

## 2024-03-12 DIAGNOSIS — Z87442 Personal history of urinary calculi: Secondary | ICD-10-CM

## 2024-03-12 DIAGNOSIS — I2089 Other forms of angina pectoris: Secondary | ICD-10-CM | POA: Diagnosis not present

## 2024-03-12 DIAGNOSIS — N400 Enlarged prostate without lower urinary tract symptoms: Secondary | ICD-10-CM | POA: Diagnosis present

## 2024-03-12 DIAGNOSIS — K219 Gastro-esophageal reflux disease without esophagitis: Secondary | ICD-10-CM | POA: Diagnosis present

## 2024-03-12 DIAGNOSIS — D649 Anemia, unspecified: Secondary | ICD-10-CM | POA: Diagnosis present

## 2024-03-12 DIAGNOSIS — E538 Deficiency of other specified B group vitamins: Secondary | ICD-10-CM | POA: Diagnosis present

## 2024-03-12 DIAGNOSIS — Z7951 Long term (current) use of inhaled steroids: Secondary | ICD-10-CM

## 2024-03-12 DIAGNOSIS — R0789 Other chest pain: Secondary | ICD-10-CM | POA: Diagnosis present

## 2024-03-12 DIAGNOSIS — J440 Chronic obstructive pulmonary disease with acute lower respiratory infection: Secondary | ICD-10-CM | POA: Diagnosis present

## 2024-03-12 DIAGNOSIS — Z87891 Personal history of nicotine dependence: Secondary | ICD-10-CM

## 2024-03-12 DIAGNOSIS — I2510 Atherosclerotic heart disease of native coronary artery without angina pectoris: Secondary | ICD-10-CM | POA: Diagnosis present

## 2024-03-12 DIAGNOSIS — B965 Pseudomonas (aeruginosa) (mallei) (pseudomallei) as the cause of diseases classified elsewhere: Secondary | ICD-10-CM | POA: Diagnosis present

## 2024-03-12 DIAGNOSIS — Z95810 Presence of automatic (implantable) cardiac defibrillator: Secondary | ICD-10-CM | POA: Diagnosis present

## 2024-03-12 DIAGNOSIS — N183 Chronic kidney disease, stage 3 unspecified: Secondary | ICD-10-CM | POA: Diagnosis present

## 2024-03-12 DIAGNOSIS — J441 Chronic obstructive pulmonary disease with (acute) exacerbation: Principal | ICD-10-CM

## 2024-03-12 DIAGNOSIS — T17908S Unspecified foreign body in respiratory tract, part unspecified causing other injury, sequela: Secondary | ICD-10-CM | POA: Diagnosis not present

## 2024-03-12 DIAGNOSIS — D693 Immune thrombocytopenic purpura: Secondary | ICD-10-CM | POA: Diagnosis present

## 2024-03-12 DIAGNOSIS — I959 Hypotension, unspecified: Secondary | ICD-10-CM | POA: Diagnosis present

## 2024-03-12 DIAGNOSIS — Z85828 Personal history of other malignant neoplasm of skin: Secondary | ICD-10-CM

## 2024-03-12 DIAGNOSIS — D638 Anemia in other chronic diseases classified elsewhere: Secondary | ICD-10-CM | POA: Diagnosis present

## 2024-03-12 DIAGNOSIS — I4891 Unspecified atrial fibrillation: Secondary | ICD-10-CM | POA: Diagnosis present

## 2024-03-12 DIAGNOSIS — I252 Old myocardial infarction: Secondary | ICD-10-CM

## 2024-03-12 DIAGNOSIS — Z7901 Long term (current) use of anticoagulants: Secondary | ICD-10-CM | POA: Diagnosis not present

## 2024-03-12 DIAGNOSIS — Z8679 Personal history of other diseases of the circulatory system: Secondary | ICD-10-CM

## 2024-03-12 DIAGNOSIS — M4802 Spinal stenosis, cervical region: Secondary | ICD-10-CM | POA: Diagnosis present

## 2024-03-12 DIAGNOSIS — Z9842 Cataract extraction status, left eye: Secondary | ICD-10-CM

## 2024-03-12 DIAGNOSIS — B961 Klebsiella pneumoniae [K. pneumoniae] as the cause of diseases classified elsewhere: Secondary | ICD-10-CM | POA: Diagnosis present

## 2024-03-12 DIAGNOSIS — H903 Sensorineural hearing loss, bilateral: Secondary | ICD-10-CM | POA: Diagnosis present

## 2024-03-12 DIAGNOSIS — J9611 Chronic respiratory failure with hypoxia: Secondary | ICD-10-CM | POA: Diagnosis present

## 2024-03-12 DIAGNOSIS — I5043 Acute on chronic combined systolic (congestive) and diastolic (congestive) heart failure: Secondary | ICD-10-CM | POA: Diagnosis present

## 2024-03-12 DIAGNOSIS — Z7984 Long term (current) use of oral hypoglycemic drugs: Secondary | ICD-10-CM

## 2024-03-12 DIAGNOSIS — Z7982 Long term (current) use of aspirin: Secondary | ICD-10-CM

## 2024-03-12 DIAGNOSIS — R54 Age-related physical debility: Secondary | ICD-10-CM | POA: Diagnosis present

## 2024-03-12 DIAGNOSIS — J690 Pneumonitis due to inhalation of food and vomit: Principal | ICD-10-CM | POA: Diagnosis present

## 2024-03-12 DIAGNOSIS — J449 Chronic obstructive pulmonary disease, unspecified: Secondary | ICD-10-CM | POA: Diagnosis present

## 2024-03-12 DIAGNOSIS — I130 Hypertensive heart and chronic kidney disease with heart failure and stage 1 through stage 4 chronic kidney disease, or unspecified chronic kidney disease: Secondary | ICD-10-CM | POA: Diagnosis present

## 2024-03-12 DIAGNOSIS — E871 Hypo-osmolality and hyponatremia: Secondary | ICD-10-CM | POA: Diagnosis not present

## 2024-03-12 DIAGNOSIS — Z8249 Family history of ischemic heart disease and other diseases of the circulatory system: Secondary | ICD-10-CM

## 2024-03-12 DIAGNOSIS — D631 Anemia in chronic kidney disease: Secondary | ICD-10-CM | POA: Diagnosis present

## 2024-03-12 DIAGNOSIS — I10 Essential (primary) hypertension: Secondary | ICD-10-CM | POA: Diagnosis present

## 2024-03-12 DIAGNOSIS — Z961 Presence of intraocular lens: Secondary | ICD-10-CM | POA: Diagnosis present

## 2024-03-12 DIAGNOSIS — D509 Iron deficiency anemia, unspecified: Secondary | ICD-10-CM | POA: Diagnosis present

## 2024-03-12 DIAGNOSIS — Z9841 Cataract extraction status, right eye: Secondary | ICD-10-CM

## 2024-03-12 DIAGNOSIS — Z8674 Personal history of sudden cardiac arrest: Secondary | ICD-10-CM

## 2024-03-12 DIAGNOSIS — E785 Hyperlipidemia, unspecified: Secondary | ICD-10-CM | POA: Diagnosis present

## 2024-03-12 DIAGNOSIS — J150 Pneumonia due to Klebsiella pneumoniae: Secondary | ICD-10-CM | POA: Diagnosis not present

## 2024-03-12 LAB — STREP PNEUMONIAE URINARY ANTIGEN: Strep Pneumo Urinary Antigen: NEGATIVE

## 2024-03-12 LAB — MAGNESIUM: Magnesium: 1.8 mg/dL (ref 1.7–2.4)

## 2024-03-12 LAB — CBC
HCT: 37.5 % — ABNORMAL LOW (ref 39.0–52.0)
Hemoglobin: 11.3 g/dL — ABNORMAL LOW (ref 13.0–17.0)
MCH: 29.5 pg (ref 26.0–34.0)
MCHC: 30.1 g/dL (ref 30.0–36.0)
MCV: 97.9 fL (ref 80.0–100.0)
Platelets: 221 K/uL (ref 150–400)
RBC: 3.83 MIL/uL — ABNORMAL LOW (ref 4.22–5.81)
RDW: 19.8 % — ABNORMAL HIGH (ref 11.5–15.5)
WBC: 16.7 K/uL — ABNORMAL HIGH (ref 4.0–10.5)
nRBC: 0.2 % (ref 0.0–0.2)

## 2024-03-12 LAB — BRAIN NATRIURETIC PEPTIDE: B Natriuretic Peptide: 197.6 pg/mL — ABNORMAL HIGH (ref 0.0–100.0)

## 2024-03-12 LAB — BASIC METABOLIC PANEL WITH GFR
Anion gap: 12 (ref 5–15)
BUN: 20 mg/dL (ref 8–23)
CO2: 30 mmol/L (ref 22–32)
Calcium: 9 mg/dL (ref 8.9–10.3)
Chloride: 89 mmol/L — ABNORMAL LOW (ref 98–111)
Creatinine, Ser: 0.87 mg/dL (ref 0.61–1.24)
GFR, Estimated: >60 mL/min (ref >60)
Glucose, Bld: 150 mg/dL — ABNORMAL HIGH (ref 70–99)
Potassium: 4.9 mmol/L (ref 3.5–5.1)
Sodium: 131 mmol/L — ABNORMAL LOW (ref 135–145)

## 2024-03-12 LAB — HEMOGLOBIN A1C
Hgb A1c MFr Bld: 5.2 % (ref 4.8–5.6)
Mean Plasma Glucose: 102.54 mg/dL

## 2024-03-12 LAB — TROPONIN I (HIGH SENSITIVITY)
Troponin I (High Sensitivity): 30 ng/L — ABNORMAL HIGH (ref <18)
Troponin I (High Sensitivity): 11 ng/L (ref <18)

## 2024-03-12 LAB — PHOSPHORUS: Phosphorus: 2.9 mg/dL (ref 2.5–4.6)

## 2024-03-12 LAB — CBG MONITORING, ED
Glucose-Capillary: 159 mg/dL — ABNORMAL HIGH (ref 70–99)
Glucose-Capillary: 300 mg/dL — ABNORMAL HIGH (ref 70–99)

## 2024-03-12 LAB — PROCALCITONIN: Procalcitonin: <0.1 ng/mL

## 2024-03-12 MED ORDER — ACETAMINOPHEN 325 MG PO TABS
650.0000 mg | ORAL_TABLET | Freq: Four times a day (QID) | ORAL | Status: DC | PRN
  Administered 2024-03-13: 22:00:00 650 mg via ORAL
  Filled 2024-03-12: qty 2

## 2024-03-12 MED ORDER — ALBUTEROL SULFATE (2.5 MG/3ML) 0.083% IN NEBU
2.5000 mg | INHALATION_SOLUTION | Freq: Four times a day (QID) | RESPIRATORY_TRACT | Status: DC
  Administered 2024-03-12 – 2024-03-14 (×6): 2.5 mg via RESPIRATORY_TRACT
  Filled 2024-03-12 (×6): qty 3

## 2024-03-12 MED ORDER — INSULIN ASPART 100 UNIT/ML IJ SOLN
0.0000 [IU] | Freq: Three times a day (TID) | INTRAMUSCULAR | Status: DC
  Administered 2024-03-12: 2 [IU] via SUBCUTANEOUS
  Administered 2024-03-12: 5 [IU] via SUBCUTANEOUS
  Administered 2024-03-13 – 2024-03-16 (×8): 1 [IU] via SUBCUTANEOUS
  Administered 2024-03-16: 19:00:00 2 [IU] via SUBCUTANEOUS
  Administered 2024-03-16 – 2024-03-17 (×2): 3 [IU] via SUBCUTANEOUS
  Administered 2024-03-17 – 2024-03-18 (×3): 1 [IU] via SUBCUTANEOUS
  Administered 2024-03-18: 2 [IU] via SUBCUTANEOUS
  Administered 2024-03-19: 1 [IU] via SUBCUTANEOUS
  Administered 2024-03-19: 2 [IU] via SUBCUTANEOUS
  Administered 2024-03-19 – 2024-03-20 (×3): 1 [IU] via SUBCUTANEOUS
  Administered 2024-03-21: 2 [IU] via SUBCUTANEOUS
  Administered 2024-03-21 – 2024-03-22 (×2): 1 [IU] via SUBCUTANEOUS
  Administered 2024-03-22: 2 [IU] via SUBCUTANEOUS
  Administered 2024-03-23 – 2024-03-24 (×2): 1 [IU] via SUBCUTANEOUS
  Filled 2024-03-12 (×5): qty 1
  Filled 2024-03-12 (×2): qty 2
  Filled 2024-03-12 (×2): qty 1
  Filled 2024-03-12: qty 2
  Filled 2024-03-12 (×2): qty 1
  Filled 2024-03-12 (×2): qty 2
  Filled 2024-03-12 (×4): qty 1
  Filled 2024-03-12: qty 3
  Filled 2024-03-12: qty 1
  Filled 2024-03-12: qty 2
  Filled 2024-03-12: qty 3
  Filled 2024-03-12: qty 1
  Filled 2024-03-12: qty 2
  Filled 2024-03-12: qty 1
  Filled 2024-03-12: qty 5
  Filled 2024-03-12: qty 1
  Filled 2024-03-12: qty 3
  Filled 2024-03-12: qty 2

## 2024-03-12 MED ORDER — SODIUM CHLORIDE 0.9 % IV SOLN
3.0000 g | Freq: Four times a day (QID) | INTRAVENOUS | Status: DC
  Administered 2024-03-12 – 2024-03-13 (×7): 3 g via INTRAVENOUS
  Filled 2024-03-12 (×7): qty 8

## 2024-03-12 MED ORDER — LEVOFLOXACIN IN D5W 750 MG/150ML IV SOLN: 750.0000 mg | INTRAVENOUS | Status: DC

## 2024-03-12 MED ORDER — MUPIROCIN 2 % EX OINT: 1.0000 | TOPICAL_OINTMENT | Freq: Every day | CUTANEOUS | Status: DC | PRN

## 2024-03-12 MED ORDER — AMIODARONE HCL 200 MG PO TABS
200.0000 mg | ORAL_TABLET | Freq: Every day | ORAL | Status: DC
  Administered 2024-03-12 – 2024-03-24 (×13): 200 mg via ORAL
  Filled 2024-03-12 (×13): qty 1

## 2024-03-12 MED ORDER — VANCOMYCIN HCL IN DEXTROSE 1-5 GM/200ML-% IV SOLN
1000.0000 mg | Freq: Once | INTRAVENOUS | Status: AC
  Administered 2024-03-12: 1000 mg via INTRAVENOUS
  Filled 2024-03-12: qty 200

## 2024-03-12 MED ORDER — FUROSEMIDE 20 MG PO TABS: 40.0000 mg | ORAL_TABLET | Freq: Every day | ORAL | Status: DC

## 2024-03-12 MED ORDER — TAMSULOSIN HCL 0.4 MG PO CAPS
0.4000 mg | ORAL_CAPSULE | Freq: Every day | ORAL | Status: DC
  Administered 2024-03-12 – 2024-03-24 (×13): 0.4 mg via ORAL
  Filled 2024-03-12 (×13): qty 1

## 2024-03-12 MED ORDER — MELATONIN 3 MG PO TABS
3.0000 mg | ORAL_TABLET | Freq: Every day | ORAL | Status: DC
  Administered 2024-03-12 – 2024-03-23 (×12): 3 mg via ORAL
  Filled 2024-03-12 (×12): qty 1

## 2024-03-12 MED ORDER — SODIUM CHLORIDE 0.9% FLUSH
3.0000 mL | Freq: Two times a day (BID) | INTRAVENOUS | Status: DC
  Administered 2024-03-12 – 2024-03-24 (×19): 3 mL via INTRAVENOUS

## 2024-03-12 MED ORDER — HEPARIN SODIUM (PORCINE) 5000 UNIT/ML IJ SOLN: 5000.0000 [IU] | Freq: Three times a day (TID) | INTRAMUSCULAR | Status: DC

## 2024-03-12 MED ORDER — SODIUM CHLORIDE 0.9 % IV SOLN: INTRAVENOUS | Status: DC

## 2024-03-12 MED ORDER — MAGNESIUM OXIDE -MG SUPPLEMENT 400 (240 MG) MG PO TABS
400.0000 mg | ORAL_TABLET | Freq: Every day | ORAL | Status: DC
  Administered 2024-03-12 – 2024-03-24 (×13): 400 mg via ORAL
  Filled 2024-03-12 (×13): qty 1

## 2024-03-12 MED ORDER — FLEET ENEMA RE ENEM: 1.0000 | ENEMA | Freq: Once | RECTAL | Status: DC | PRN

## 2024-03-12 MED ORDER — GUAIFENESIN ER 600 MG PO TB12
1200.0000 mg | ORAL_TABLET | Freq: Three times a day (TID) | ORAL | Status: DC
  Administered 2024-03-12 – 2024-03-24 (×36): 1200 mg via ORAL
  Filled 2024-03-12 (×36): qty 2

## 2024-03-12 MED ORDER — FERROUS SULFATE 325 (65 FE) MG PO TABS
ORAL_TABLET | Freq: Every day | ORAL | Status: DC
  Administered 2024-03-12 – 2024-03-24 (×13): 325 mg via ORAL
  Filled 2024-03-12 (×13): qty 1

## 2024-03-12 MED ORDER — HYDRALAZINE HCL 20 MG/ML IJ SOLN: 10.0000 mg | INTRAMUSCULAR | Status: DC | PRN

## 2024-03-12 MED ORDER — METHYLPREDNISOLONE SODIUM SUCC 40 MG IJ SOLR
40.0000 mg | INTRAMUSCULAR | Status: DC
  Administered 2024-03-13 – 2024-03-16 (×4): 40 mg via INTRAVENOUS
  Filled 2024-03-12 (×4): qty 1

## 2024-03-12 MED ORDER — METHYLPREDNISOLONE SODIUM SUCC 125 MG IJ SOLR
125.0000 mg | INTRAMUSCULAR | Status: AC
  Administered 2024-03-12: 125 mg via INTRAVENOUS
  Filled 2024-03-12: qty 2

## 2024-03-12 MED ORDER — SENNOSIDES-DOCUSATE SODIUM 8.6-50 MG PO TABS
1.0000 | ORAL_TABLET | Freq: Every evening | ORAL | Status: DC | PRN
  Administered 2024-03-15: 10:00:00 1 via ORAL
  Filled 2024-03-12: qty 1

## 2024-03-12 MED ORDER — SODIUM CHLORIDE 0.9% FLUSH
3.0000 mL | Freq: Two times a day (BID) | INTRAVENOUS | Status: DC
  Administered 2024-03-12 – 2024-03-24 (×25): 3 mL via INTRAVENOUS

## 2024-03-12 MED ORDER — IPRATROPIUM-ALBUTEROL 0.5-2.5 (3) MG/3ML IN SOLN
3.0000 mL | Freq: Once | RESPIRATORY_TRACT | Status: AC
  Administered 2024-03-12: 3 mL via RESPIRATORY_TRACT
  Filled 2024-03-12: qty 3

## 2024-03-12 MED ORDER — NITROGLYCERIN 0.4 MG SL SUBL: 0.4000 mg | SUBLINGUAL_TABLET | SUBLINGUAL | Status: DC | PRN

## 2024-03-12 MED ORDER — FENTANYL CITRATE (PF) 50 MCG/ML IJ SOSY
12.5000 ug | PREFILLED_SYRINGE | Freq: Once | INTRAMUSCULAR | Status: AC
  Administered 2024-03-12: 12.5 ug via INTRAVENOUS
  Filled 2024-03-12: qty 1

## 2024-03-12 MED ORDER — ACETAMINOPHEN 650 MG RE SUPP: 650.0000 mg | Freq: Four times a day (QID) | RECTAL | Status: DC | PRN

## 2024-03-12 MED ORDER — SPIRONOLACTONE 12.5 MG HALF TABLET: 12.5000 mg | ORAL_TABLET | Freq: Every day | ORAL | Status: DC

## 2024-03-12 MED ORDER — CARVEDILOL 3.125 MG PO TABS
3.1250 mg | ORAL_TABLET | Freq: Two times a day (BID) | ORAL | Status: DC
  Administered 2024-03-12 – 2024-03-24 (×23): 3.125 mg via ORAL
  Filled 2024-03-12 (×25): qty 1

## 2024-03-12 MED ORDER — APIXABAN 5 MG PO TABS
5.0000 mg | ORAL_TABLET | Freq: Two times a day (BID) | ORAL | Status: DC
  Administered 2024-03-12 – 2024-03-24 (×25): 5 mg via ORAL
  Filled 2024-03-12 (×25): qty 1

## 2024-03-12 MED ORDER — ESCITALOPRAM OXALATE 20 MG PO TABS
20.0000 mg | ORAL_TABLET | Freq: Every day | ORAL | Status: DC
  Administered 2024-03-12 – 2024-03-24 (×13): 20 mg via ORAL
  Filled 2024-03-12 (×2): qty 2
  Filled 2024-03-12 (×2): qty 1
  Filled 2024-03-12 (×3): qty 2
  Filled 2024-03-12: qty 1
  Filled 2024-03-12 (×2): qty 2
  Filled 2024-03-12: qty 1
  Filled 2024-03-12 (×2): qty 2

## 2024-03-12 MED ORDER — FLORANEX PO PACK
1.0000 g | PACK | Freq: Three times a day (TID) | ORAL | Status: DC
  Administered 2024-03-12 – 2024-03-13 (×3): 1 g via ORAL
  Filled 2024-03-12 (×7): qty 1

## 2024-03-12 MED ORDER — TRAZODONE HCL 50 MG PO TABS: 25.0000 mg | ORAL_TABLET | Freq: Every evening | ORAL | Status: DC | PRN

## 2024-03-12 MED ORDER — BISACODYL 5 MG PO TBEC
5.0000 mg | DELAYED_RELEASE_TABLET | Freq: Every day | ORAL | Status: DC | PRN
  Administered 2024-03-16 – 2024-03-23 (×2): 5 mg via ORAL
  Filled 2024-03-12 (×4): qty 1

## 2024-03-12 MED ORDER — OXYCODONE HCL 5 MG PO TABS
5.0000 mg | ORAL_TABLET | ORAL | Status: DC | PRN
  Administered 2024-03-13: 22:00:00 5 mg via ORAL
  Filled 2024-03-12: qty 1

## 2024-03-12 MED ORDER — MEXILETINE HCL 200 MG PO CAPS
200.0000 mg | ORAL_CAPSULE | Freq: Two times a day (BID) | ORAL | Status: DC
  Administered 2024-03-12 – 2024-03-24 (×24): 200 mg via ORAL
  Filled 2024-03-12 (×28): qty 1

## 2024-03-12 MED ORDER — ASPIRIN 81 MG PO TBEC
81.0000 mg | DELAYED_RELEASE_TABLET | Freq: Every day | ORAL | Status: DC
  Administered 2024-03-12 – 2024-03-24 (×13): 81 mg via ORAL
  Filled 2024-03-12 (×13): qty 1

## 2024-03-12 MED ORDER — IPRATROPIUM BROMIDE 0.02 % IN SOLN: 0.5000 mg | Freq: Four times a day (QID) | RESPIRATORY_TRACT | Status: DC | PRN

## 2024-03-12 MED ORDER — EMPAGLIFLOZIN 10 MG PO TABS
10.0000 mg | ORAL_TABLET | Freq: Every day | ORAL | Status: DC
  Administered 2024-03-12: 10 mg via ORAL
  Filled 2024-03-12: qty 1

## 2024-03-12 MED ORDER — SODIUM CHLORIDE 0.9 % IV SOLN
500.0000 mg | Freq: Once | INTRAVENOUS | Status: AC
  Administered 2024-03-12: 500 mg via INTRAVENOUS
  Filled 2024-03-12: qty 5

## 2024-03-12 MED ORDER — IOHEXOL 350 MG/ML SOLN
75.0000 mL | Freq: Once | INTRAVENOUS | Status: AC | PRN
  Administered 2024-03-12: 75 mL via INTRAVENOUS

## 2024-03-12 MED ORDER — DOXYCYCLINE HYCLATE 100 MG PO TABS
100.0000 mg | ORAL_TABLET | Freq: Two times a day (BID) | ORAL | Status: AC
  Administered 2024-03-12 – 2024-03-18 (×8): 100 mg via ORAL
  Filled 2024-03-12 (×14): qty 1

## 2024-03-12 MED ORDER — HYDROMORPHONE HCL 1 MG/ML IJ SOLN
0.5000 mg | INTRAMUSCULAR | Status: DC | PRN
  Administered 2024-03-13: 03:00:00 1 mg via INTRAVENOUS
  Filled 2024-03-12: qty 1

## 2024-03-12 MED ORDER — ONDANSETRON HCL 4 MG/2ML IJ SOLN: 4.0000 mg | Freq: Four times a day (QID) | INTRAMUSCULAR | Status: DC | PRN

## 2024-03-12 MED ORDER — METHYLPREDNISOLONE SODIUM SUCC 40 MG IJ SOLR: 40.0000 mg | Freq: Two times a day (BID) | INTRAMUSCULAR | Status: DC

## 2024-03-12 MED ORDER — ONDANSETRON HCL 4 MG PO TABS: 4.0000 mg | ORAL_TABLET | Freq: Four times a day (QID) | ORAL | Status: DC | PRN

## 2024-03-12 MED ORDER — PANTOPRAZOLE SODIUM 40 MG PO TBEC
40.0000 mg | DELAYED_RELEASE_TABLET | Freq: Every day | ORAL | Status: DC
  Administered 2024-03-12 – 2024-03-15 (×4): 40 mg via ORAL
  Filled 2024-03-12 (×4): qty 1

## 2024-03-12 MED ORDER — ALBUTEROL SULFATE (2.5 MG/3ML) 0.083% IN NEBU
5.0000 mg | INHALATION_SOLUTION | Freq: Once | RESPIRATORY_TRACT | Status: AC
  Administered 2024-03-12: 5 mg via RESPIRATORY_TRACT
  Filled 2024-03-12: qty 6

## 2024-03-12 NOTE — Assessment & Plan Note (Signed)
 Brief left-sided chest pain-resolved Denies any chest pain at this moment -Troponin 30, 11, no acute changes on EKG

## 2024-03-12 NOTE — Assessment & Plan Note (Signed)
 Currently stable, continue home medication of Lexapro 

## 2024-03-12 NOTE — Assessment & Plan Note (Signed)
-   No current complaint, denies any numbness or tingling, acute on chronic generalized weaknesses -Will consult PT/OT for evaluation

## 2024-03-12 NOTE — Assessment & Plan Note (Signed)
 Noted

## 2024-03-12 NOTE — Assessment & Plan Note (Signed)
 Noted stable

## 2024-03-12 NOTE — Progress Notes (Signed)
° °  Brief Progress Note   _____________________________________________________________________________________________________________  Patient Name: Steven Guzman Patient DOB: May 11, 1940 Date: @TODAY @      Data: Reviewed vital signs, labs, and clinical notes.    Action: No action required at this time.    Response:  No telemetry beds available at present.  _____________________________________________________________________________________________________________  [facility] RN Expeditor Phelicia Dantes S Rever Pic[place]te Please contact us  directly via secure chat (search for [facility]) or by calling us  at [phone] [facility]).

## 2024-03-12 NOTE — Assessment & Plan Note (Signed)
 Assessed in place, no recent history of discharge -Patient reports no recent changes.  Stable

## 2024-03-12 NOTE — ED Notes (Signed)
 Unable to obtain EKG. Attempted with two machines. Pt stated that he has a Barostim in his heart that requires a neurologist to turn it off for an EKG to be done. RN aware.

## 2024-03-12 NOTE — Assessment & Plan Note (Addendum)
-   Stable -Mildly hypotensive current BP 94/74, HR 95 BNP 197.6, minimal lower extremity edema-mild congestion -Review home medication: Currently listed Aldactone , Lasix , Coreg , aspirin , Repatha , mexiletine , Jardiance  .. . Will resume judiciously due to hypotension-once confirmed home meds

## 2024-03-12 NOTE — ED Triage Notes (Signed)
 The pt reports some sob and lt sided chest pain he just started on nasal 02 one week ago his sats were low on arrival no 02 on pt on his arrival nasal 02  at present

## 2024-03-12 NOTE — Progress Notes (Incomplete)

## 2024-03-12 NOTE — Assessment & Plan Note (Signed)
 Anemia of chronic disease with iron deficiency anemia -Continue ferrous sulfate , H&H stable

## 2024-03-12 NOTE — Hospital Course (Addendum)
 Steven Guzman is a 83 y.o. male with PMH of  HTN, COPD, chronic hypoxic respiratory failure, CAD, HFrEF, and VT with ICD presenting with pleuritic chest pain and shortness of breath. Patient reports a recent hospitalization, posthospitalization remain really weak, developed left-sided chest pain with shortness of breath overnight.  CTA is negative for PE,7.3 x 6.6 cm rounded ill-defined masslike consolidative opacity - pneumonia (vs neoplasm). 4.5 cm diameter ascending thoracic aortic aneurysm.  Was slowly improving until 12/21 patient states he has  worsening breathing and weakness AND felt like he can go home safely with home health as his wife is already in a rehab. At this time waiting on placement to rehab.  Subjective: Seen and examined  Patient reports she slept well denies shortness of breath chest pain fever chills Overnight remains afebrile, on 3 L [place],  VSS, Labs reviewed-stable renal function hemoglobin remained stable  at 10   Assessment and plan:  Possible bacterial multifocal Pneumonia, POA 7.3 x 6.6 cm rounded ill-defined masslike consolidative opacity: Completed antibiotic course. sputum cultures-- appears to be colonized as culture has rare Pseudomonas and Klebsiella Overall stable no leukocytosis, no fever. Continue home supplemental oxygen -using since last discharge.  Continue PT OT, IS, Pulm toileting. Continue oral steroid taper-ordered. SLP eval completed advised not to use a straw. chest x-ray 12/20 showed multifocal infiltrates. He does have 7.3 x 6.6 cm rounded ill-defined masslike consolidative opacity in the posterior left upper lobe. Imaging features are compatible with pneumonia although neoplasm is not excluded and close follow-up-this was discussed with the patient and he will need follow-up imaging soon ON D/C within a month.  Chronic hypoxic respiratory failure : continue with 3 L oxygen . Goal oxygen  saturation is 88-92%.  Continue pulmonary toileting I-S    Hyponatremia Mild, stable    Constipation Improved, continue stool softener    Acute on chronic combined systolic and diastolic CHF TTE 87/3/74 showed EF of 35-40%. Continue with lasix  40 mg PO Daily  Net IO Since Admission: -17,408.19 mL [03/23/24 1050]   Atypical chest pain: resolved  AF, permanent s/p d fib ICD: Stable, rate controlled- continue Coreg , mexiletine, amiodarone , and Eliquis .  Monitor on telemetry   Hyperlipidemia : On Repatha    Essential hypertension hypotensive, holding home heart medications stable bp on low-dose Coreg  3.125, oral Lasix    CAD: Denies any chest pain.  Continue her aspirin  81, Coreg    History of abdominal aortic aneurysm: Noted-stable  Chronic idiopathic thrombocytopenia  Last platelet count normal in 250s    Bilateral sensorineural hearing loss: Stable   Degenerative cervical spinal stenosis No current complaint, denies any numbness or tingling, acute on chronic generalized weaknesses continue with PT   B12 deficiency No acute issues   Anemia of chronic disease with iron deficiency anemia: Stable, continue ferrous sulfate , H&H stable overall.  Follow-up outpatient   BPH: Monitoring for urinary retention, currently no complaint, continue Flomax    COPD GOLD grade C: Stable, inhalational bronchodilator therapy. Encouraging incentive spirometer, flutter valve, mucolytics.    Major depressive disorder with single episode, in partial remission Mood stable, continue home medication of Lexapro    GERD: Continue PPI   Prediabetes Last A1c 5 months ago 5.8.  Blood sugar fairly controlled keep on SSI   Mobility: PT Orders: Active PT Follow up Rec: Skilled Nursing-Short Term Rehab (<3 Hours/Day)03/22/2024 1618   DVT prophylaxis: SCDs Start: 03/12/24 0808 Code Status:   Code Status: Full Code Family Communication: plan of care discussed with patient at bedside. Patient status  is: Remains hospitalized because of severity of  illness Level of care: Telemetry   Dispo: The patient is from: home            Anticipated disposition: Awaiting for SNF, his wife is at the SNF, unable to return home .awaiting to complete  P2P -peer to peer requested -Provider to call: [phone] Option 5. Deadline is: 03/24/24 11:00 AM - [facility] office is closed today  Objective: Vitals last 24 hrs: Vitals:   03/23/24 0500 03/23/24 0646 03/23/24 0824 03/23/24 1042  BP:  109/70  109/75  Pulse:  67 67 71  Resp:  16 17 18   Temp:  97.8 F (36.6 C)  98 F (36.7 C)  TempSrc:  Oral  Oral  SpO2:  99% 97% 93%  Weight: 65.5 kg     Height:        Physical Examination: General exam: AAOX3.  Pleasant HEENT:Oral mucosa moist, Ear/Nose WNL grossly Respiratory system: CTA Bilaterally Cardiovascular system: S1 & S2 +, No JVD. Gastrointestinal system: Abdomen soft,NT,ND, BS+ Nervous System: Alert, awake, non focal Extremities: extremities warm, leg edema neg Skin: Warm, no rashes MSK: Normal muscle bulk,tone, power   Medications reviewed:  Scheduled Meds:  acidophilus  1 capsule Oral BID   amiodarone   200 mg Oral Daily   apixaban   5 mg Oral BID   arformoterol   15 mcg Nebulization BID   And   umeclidinium bromide   1 puff Inhalation Daily   aspirin  EC  81 mg Oral Daily   carvedilol   3.125 mg Oral BID   empagliflozin   10 mg Oral Daily   escitalopram   20 mg Oral Daily   feeding supplement  237 mL Oral BID BM   ferrous sulfate    Oral Daily   furosemide   40 mg Oral Daily   guaiFENesin   1,200 mg Oral Q8H   insulin  aspart  0-9 Units Subcutaneous TID WC   lidocaine   1 patch Transdermal Q24H   magnesium  oxide  400 mg Oral Daily   melatonin  3 mg Oral QHS   mexiletine  200 mg Oral BID   nystatin   5 mL Oral QID   pantoprazole   40 mg Oral BID   predniSONE   30 mg Oral Q breakfast   Followed by   NOREEN ON 03/25/2024] predniSONE   20 mg Oral Q breakfast   Followed by   NOREEN ON 03/27/2024] predniSONE   10 mg Oral Q breakfast    pregabalin   25 mg Oral BID   senna-docusate  1 tablet Oral BID   sodium chloride  flush  3 mL Intravenous Q12H   sodium chloride  flush  3 mL Intravenous Q12H   tamsulosin   0.4 mg Oral Daily   Continuous Infusions: Diet: Diet Order             Diet regular Room service appropriate? Yes; Fluid consistency: Thin  Diet effective now

## 2024-03-12 NOTE — Assessment & Plan Note (Addendum)
 Stable -will continue Coreg , mexiletine, amiodarone , and Eliquis   - Defibrillator in place

## 2024-03-12 NOTE — H&P (Addendum)
 History and Physical   Patient: Steven Guzman                            PCP: Esmeralda Morton [name]                    DOB: [birth year]            DOA: 03/12/2024 FMW:[id number]             DOS: 03/12/2024, 9:56 AM  Esmeralda Morton JONELLE, PA-C  Patient coming from:   HOME  I have personally reviewed patient's medical records, in electronic medical records, including:  [facility], and care everywhere.    Chief Complaint:   Chief Complaint  Patient presents with   Shortness of Breath   Chest Pain    History of present illness:    Steven Guzman is a 83 yr old man with HTN, COPD, chronic hypoxic respiratory failure, CAD, HFrEF, and VT with ICD presenting with pleuritic chest pain and SOB.  Patient reports a recent hospitalization, posthospitalization remain really weak, developed left-sided chest pain with shortness of breath overnight.  Denies any fever or cough.   ED Evaluation: Blood pressure 94/74, pulse 95, temperature 98.6 F (37 C), temperature source Oral, resp. rate 14, height 5' 10 (1.778 m), weight 65.7 kg, SpO2 100%.  LABs: WBC 16.7, Hb 11.3, sodium 139, chloride 89, troponin 30, 11, glucose 150, BNP 197.6, CTA is negative for PE,7.3 x 6.6 cm rounded ill-defined masslike consolidative opacity - pneumonia (vs neoplasm). 4.5 cm diameter ascending thoracic aortic aneurysm.  Echo: on 12/06 ; EF 35-40%, Moderately decreased LV function, severely calcified aortic valve He was given IV steroids, nebs, and antibiotics in the ED     Patient Denies having: Fever, Chills, Chest Pain, Abd pain, N/V/D, headache, dizziness, lightheadedness,  Dysuria, Joint pain, rash, open wounds  ED Course:   Blood pressure 94/74, pulse 95, temperature 98.6 F (37 C), temperature source Oral, resp. rate 14, height 5' 10 (1.778 m), weight 65.7 kg, SpO2 100%. Abnormal labs;   Review of Systems: As per HPI, otherwise 10 point review of systems were negative.    ----------------------------------------------------------------------------------------------------------------------  Allergies[1]  Home MEDs:  Prior to Admission medications  Medication Sig Start Date End Date Taking? Authorizing Provider  acetaminophen  (TYLENOL ) 500 MG tablet Take 1,000 mg by mouth every 6 (six) hours as needed for moderate pain.   Yes [provider]  albuterol  (PROVENTIL ) (2.5 MG/3ML) 0.083% nebulizer solution USE 1 VIAL VIA NEBULIZER EVERY 6 HOURS AS NEEDED FOR WHEEZING OR SHORTNESS OF BREATH 07/18/19  Yes Olalere, Adewale A, MD  amiodarone  (PACERONE ) 200 MG tablet Take 1 tablet (200 mg total) by mouth daily. \ 08/27/23  Yes Lesia Ozell Barter, PA-C  Artificial Tear Solution (SOOTHE XP OP) Place 2 drops into both eyes daily as needed (dry eyes).   Yes [provider]  Ascorbic Acid (VITAMIN C) 1000 MG tablet Take 1,000 mg by mouth daily.   Yes [provider]  aspirin  EC 81 MG tablet Take 1 tablet (81 mg total) by mouth daily. Swallow whole. 03/21/22  Yes Meng, Hao, PA  Biotin  5000 MCG CAPS Take 5,000 mcg by mouth daily.   Yes [provider]  Calcium  Citrate-Vitamin D  (CALCIUM  + D PO) Take 1 tablet by mouth daily.   Yes [provider]  carvedilol  (COREG ) 3.125 MG tablet Take 1 tablet (3.125 mg total)  by mouth 2 (two) times daily. 04/26/23  Yes Fountain, Madison L, NP  cetirizine (ZYRTEC) 10 MG tablet Take 10 mg by mouth daily.   Yes [provider]  Cholecalciferol  50 MCG (2000 UT) TABS Take 2,000 Units by mouth daily at 6 (six) AM. 09/12/18  Yes [provider]  Cyanocobalamin  (B12 LIQUID HEALTH BOOSTER PO) Take 5,000 mcg by mouth daily.   Yes [provider]  Docusate Sodium  (DSS) 100 MG CAPS Take 2 capsules by mouth daily in the afternoon. 02/10/22  Yes [provider]  ELIQUIS  5 MG TABS tablet TAKE 1 TABLET BY MOUTH TWICE A DAY 01/11/24  Yes Croitoru, Mihai, MD  empagliflozin   (JARDIANCE ) 10 MG TABS tablet Take 1 tablet (10 mg total) by mouth daily. 04/19/23  Yes Croitoru, Mihai, MD  escitalopram  (LEXAPRO ) 20 MG tablet Take 20 mg by mouth daily. 11/03/23  Yes [provider]  Evolocumab  (REPATHA  SURECLICK) 140 MG/ML SOAJ INJECT 1 ML INTO THE SKIN EVERY 14 DAYS 01/10/24  Yes Croitoru, Mihai, MD  fluticasone  (FLONASE ) 50 MCG/ACT nasal spray Place 2 sprays into both nostrils daily. Patient taking differently: Place 2 sprays into both nostrils as needed for allergies. 02/13/21  Yes Fargo, Amy E, NP  furosemide  (LASIX ) 40 MG tablet Take 1 tablet (40 mg total) by mouth daily. 03/05/24 06/03/24 Yes Fairy Frames, MD  Guaifenesin  1200 MG TB12 Take 1,200 mg by mouth daily.   Yes [provider]  Ipratropium-Albuterol  (COMBIVENT  RESPIMAT) 20-100 MCG/ACT AERS respimat Inhale 1 puff into the lungs every 6 (six) hours. Shortness of breath or wheezing Patient taking differently: Inhale 1 puff into the lungs every 6 (six) hours as needed for wheezing or shortness of breath. Shortness of breath or wheezing 09/18/21  Yes Fargo, Amy E, NP  IRON PO Take 1 tablet by mouth daily.   Yes [provider]  Magnesium  200 MG TABS Take 400 mg by mouth daily.   Yes [provider]  melatonin 3 MG TABS tablet Take 3 mg by mouth at bedtime.   Yes [provider]  mexiletine (MEXITIL ) 200 MG capsule TAKE 1 CAPSULE BY MOUTH 2 TIMES A DAY 02/17/24  Yes Fountain, Madison L, NP  Multiple Vitamins-Minerals (CENTRUM ADULTS PO) Take 1 tablet by mouth daily.   Yes [provider]  mupirocin  ointment (BACTROBAN ) 2 % Apply 1 Application topically daily as needed (wound care). 11/04/22  Yes [provider]  nitroGLYCERIN  (NITROSTAT ) 0.4 MG SL tablet Place 1 tablet (0.4 mg total) under the tongue every 5 (five) minutes as needed for chest pain. DISSOLVE 1 TABLET UNDER THE TONGUE EVERY 5 MINUTES FOR 3 DOSES 12/15/23  Yes Court Dorn PARAS, MD  omeprazole  (PRILOSEC) 20 MG capsule Take 20 mg by mouth daily.    Yes [provider]  potassium chloride  20 MEQ TBCR Take 1 tablet (20 mEq total) by mouth daily. 03/05/24 06/03/24 Yes Fairy Frames, MD  pregabalin  (LYRICA ) 25 MG capsule Take by mouth in the morning and at bedtime. 03/08/23  Yes [provider]  spironolactone  (ALDACTONE ) 25 MG tablet Take 0.5 tablets (12.5 mg total) by mouth daily. 11/02/22  Yes Cleaver, Josefa HERO, NP  STIOLTO RESPIMAT 2.5-2.5 MCG/ACT AERS Inhale 2 puffs into the lungs daily. 09/22/21  Yes [provider]  tamsulosin  (FLOMAX ) 0.4 MG CAPS capsule TAKE ONE CAPSULE BY MOUTH DAILY AFTER SUPPER 06/30/21  Yes Fargo, Amy E, NP  Respiratory Therapy Supplies (FLUTTER) DEVI Use as directed 03/16/17   McQuaid,  Vicenta NOVAK, MD  Spacer/Aero-Holding Chambers (OPTICHAMBER DIAMOND ) MISC optichamber [facility] 09/12/19   Olalere, Jennet LABOR, MD    PRN MEDs: acetaminophen  **OR** acetaminophen , bisacodyl , HYDROmorphone  (DILAUDID ) injection, ipratropium, mupirocin  ointment, nitroGLYCERIN , ondansetron  **OR** ondansetron  (ZOFRAN ) IV, oxyCODONE , senna-docusate, traZODone   Past Medical History:  Diagnosis Date   AICD (automatic cardioverter/defibrillator) present    [employer] MOMENTUM EL ICD D121/ [phone]   Aneurysm    a. Aneurysmal infrarenal aorta up to 33 mm on CT 10/2014, recommended f/u due 10/2017   Anginal pain    Anxiety    Arthritis    right foot   Basal cell carcinoma of nose    S/P MOHS   Biliary acute pancreatitis    CAD (coronary artery disease)    a. s/p MI in 1994 with PCI to LAD at that time b. cath 10/2012 demonstrated EF 30%, inferior akinesis with mild hypokinesis of all walls, patent LAD and RCA stents; ostial PDA with 80-90% obstruction with medical therapy recommended    Chronic systolic CHF (congestive heart failure) (HCC)    EF 30 to 35 % as of 09/2014.    CKD (chronic kidney disease), stage III (HCC)    Complication of anesthesia 10/2014   had to  have defibrillator w/ERCP- CODED after having gallstones removed   COPD (chronic obstructive pulmonary disease) (HCC)    a. followed by pulmonary, COPD GOLD stage II   Depression    Diverticulosis of colon 07/2014   noted on CT   Dyspnea    with exertion   GERD (gastroesophageal reflux disease)    Hiatal hernia    large   History of kidney stones    passed stone   Hyperglycemia 10/2012   Hyperlipidemia    Hypertension    Myocardial infarction [facility]) 1994; 2011   Pneumonia 1946; 2015   Prostate enlargement 07/2014   observed on CT   Tobacco abuse    Ventricular tachycardia (HCC)    a. 08/2009 s/p BSX E110 Teligen 100 AICD, ser#: [id number];  b. 08/2008 VT req ATP - detection reprogrammed from 160 to 150. c. EPS and VT ablation by Dr. Waddell 12/21/2014    Past Surgical History:  Procedure Laterality Date   BIOPSY  12/21/2017   Procedure: BIOPSY;  Surgeon: Abran Norleen SAILOR, MD;  Location: [facility] ENDOSCOPY;  Service: Endoscopy;;   CATARACT EXTRACTION W/ INTRAOCULAR LENS  IMPLANT, BILATERAL Bilateral ~ 2011   COLONOSCOPY     COLONOSCOPY WITH PROPOFOL  N/A 12/21/2017   Procedure: COLONOSCOPY WITH PROPOFOL ;  Surgeon: Abran Norleen SAILOR, MD;  Location: [facility] ENDOSCOPY;  Service: Endoscopy;  Laterality: N/A;   ELECTROPHYSIOLOGIC STUDY N/A 12/21/2014   Procedure: V Tach Ablation;  Surgeon: Danelle LELON Waddell, MD;  Location: [facility] INVASIVE CV LAB;  Service: Cardiovascular;  Laterality: N/A;   ERCP N/A 11/16/2014   Procedure: ENDOSCOPIC RETROGRADE CHOLANGIOPANCREATOGRAPHY (ERCP);  Surgeon: Lamar JONETTA Aho, MD;  Location: [facility] ENDOSCOPY;  Service: Endoscopy;  Laterality: N/A;   ESOPHAGOGASTRODUODENOSCOPY (EGD) WITH PROPOFOL  N/A 12/21/2017   Procedure: ESOPHAGOGASTRODUODENOSCOPY (EGD) WITH PROPOFOL ;  Surgeon: Abran Norleen SAILOR, MD;  Location: [facility] ENDOSCOPY;  Service: Endoscopy;  Laterality: N/A;   EYE SURGERY     FOOT SURGERY Left 2005   fixed bone that stuck out in my ankle area   HEMORRHOID BANDING     ICD GENERATOR  CHANGEOUT N/A 04/20/2022   Procedure: ICD GENERATOR CHANGEOUT;  Surgeon: Waddell Danelle LELON, MD;  Location: [facility] INVASIVE CV LAB;  Service: Cardiovascular;  Laterality: N/A;   IMPLANTABLE  CARDIOVERTER DEFIBRILLATOR IMPLANT  09/06/09   BSX dual chamber ICD implanted in [place]  for cardiac arrest 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   INGUINAL HERNIA REPAIR Right ~ 1995   INGUINAL HERNIA REPAIR Left 04/10/2022   Procedure: HERNIA REPAIR INGUINAL ADULT;  Surgeon: Ebbie Cough, MD;  Location: [facility] OR;  Service: General;  Laterality: Left;   LEFT HEART CATH AND CORONARY ANGIOGRAPHY N/A 03/19/2022   Procedure: LEFT HEART CATH AND CORONARY ANGIOGRAPHY;  Surgeon: Jordan, Peter M, MD;  [facility] INVASIVE CV LAB;  Service: Cardiovascular;  Laterality: N/A;   LEFT HEART CATHETERIZATION WITH CORONARY ANGIOGRAM N/A 11/25/2012   demonstrated EF 30%, inferior akinesis with mild hypokinesis of all walls, patent LAD and RCA stents; ostial PDA with 80-90% obstruction with medical therapy recommended   MOHS SURGERY  2008   nose, skin graft   POLYPECTOMY  12/21/2017   Procedure: POLYPECTOMY;  Surgeon: Abran Norleen SAILOR, MD;  Location: [facility] ENDOS[facility];  Service: Endoscopy;;   RETINAL DETACHMENT SURGERY Right 2013   TENOLYSIS Right 12/21/2013   Procedure: TENOLYSIS FLEXOR CARPI RADIALIS ,DEBRIDEMENT RIGHT JOINT WRIST,DEBRIDEMENT SCAPHOTRAPEZIAL TRAPEZOID, REPAIR OF EXTENSOR HOOD;  Surgeon: Arley Curia, MD;  Location: [facility];  Service: Orthopedics;  Laterality: Right;   TOE SURGERY Right 09/2019   3rd toe/hammer toe   V-TACH ABLATION  12/21/2014   VIDEO BRONCHOSCOPY Bilateral 01/09/2016   Procedure: VIDEO BRONCHOSCOPY WITHOUT FLUORO;  Surgeon: Vicenta KATHEE Lennert, MD;  Location: [facility] ENDOSCOPY;  Service: Cardiopulmonary;  Laterality: Bilateral;     reports that he has been smoking cigarettes. He has a 55 pack-year smoking history. He has never used smokeless tobacco. He reports current alcohol use. He reports that he does  not use drugs.   Family History  Problem Relation Age of Onset   Heart attack Brother    CAD Father    Hypertension Father    CAD Mother    Hypertension Mother    Hypertension Brother    Stroke Neg Hx     Physical Exam:   Vitals:   03/12/24 0559 03/12/24 0615 03/12/24 0721 03/12/24 0943  BP:  94/74  109/73  Pulse: 93 95  95  Resp: 20 14  (!) 25  Temp:    98.7 F (37.1 C)  TempSrc:    Oral  SpO2: 100% 94% 100% 95%  Weight:      Height:       Constitutional: NAD, calm, comfortable Eyes: PERRL, lids and conjunctivae normal ENMT: Mucous membranes are moist. Posterior pharynx clear of any exudate or lesions.Normal dentition.  Neck: normal, supple, no masses, no thyromegaly Respiratory: clear to auscultation bilaterally, no wheezing, no crackles. Normal respiratory effort. No accessory muscle use.  Cardiovascular: Regular rate and rhythm, no murmurs / rubs / gallops. No extremity edema. 2+ pedal pulses. No carotid bruits.  Abdomen: no tenderness, no masses palpated. No hepatosplenomegaly. Bowel sounds positive.  Musculoskeletal: no clubbing / cyanosis. No joint deformity upper and lower extremities. Good ROM, no contractures. Normal muscle tone.  Neurologic: CN II-XII grossly intact. Sensation intact, DTR normal. Strength 5/5 in all 4.  Psychiatric: Normal judgment and insight. Alert and oriented x 3. Normal mood.  Skin: no rashes, lesions, ulcers. No induration    Labs on admission:    I have personally reviewed following labs and imaging studies  CBC: Recent Labs  Lab 03/12/24 0110  WBC 16.7*  HGB 11.3*  HCT 37.5*  MCV 97.9  PLT 221   Basic Metabolic Panel: Recent Labs  Lab 03/12/24 0110  NA 131*  K 4.9  CL 89*  CO2 30  GLUCOSE 150*  BUN 20  CREATININE 0.87  CALCIUM  9.0    Urine analysis:    Component Value Date/Time   COLORURINE YELLOW 03/03/2024 1415   APPEARANCEUR CLEAR 03/03/2024 1415   LABSPEC 1.015 03/03/2024 1415   PHURINE 7.0 03/03/2024  1415   GLUCOSEU >=500 (A) 03/03/2024 1415   GLUCOSEU NEGATIVE 05/24/2015 1007   HGBUR NEGATIVE 03/03/2024 1415   BILIRUBINUR NEGATIVE 03/03/2024 1415   BILIRUBINUR Negative 12/11/2021 1529   KETONESUR NEGATIVE 03/03/2024 1415   PROTEINUR NEGATIVE 03/03/2024 1415   UROBILINOGEN 0.2 12/11/2021 1529   UROBILINOGEN 1.0 05/24/2015 1007   NITRITE NEGATIVE 03/03/2024 1415   LEUKOCYTESUR NEGATIVE 03/03/2024 1415    Last A1C:  Lab Results  Component Value Date   HGBA1C 5.3 03/19/2022     Radiologic Exams on Admission:   CT Angio Chest PE W and/or Wo Contrast Result Date: 03/12/2024 CLINICAL DATA:  Shortness of breath and chest pain. Syncope. Clinical concern for pulmonary embolus. EXAM: CT ANGIOGRAPHY CHEST WITH CONTRAST TECHNIQUE: Multidetector CT imaging of the chest was performed using the standard protocol during bolus administration of intravenous contrast. Multiplanar CT image reconstructions and MIPs were obtained to evaluate the vascular anatomy. RADIATION DOSE REDUCTION: This exam was performed according to the departmental dose-optimization program which includes automated exposure control, adjustment of the mA and/or kV according to patient size and/or use of iterative reconstruction technique. CONTRAST:  75mL OMNIPAQUE  IOHEXOL  350 MG/ML SOLN COMPARISON:  02/25/2022 FINDINGS: Cardiovascular: Heart size upper normal. No substantial pericardial effusion. Coronary artery calcification is evident. Moderate atherosclerotic calcification is noted in the wall of the thoracic aorta. Ascending thoracic aorta measures 4.5 cm diameter. There is no filling defect within the opacified pulmonary arteries to suggest the presence of an acute pulmonary embolus. Left-sided permanent pacemaker/AICD evident. Battery pack for stimulator device overlies the upper right anterior hemithorax. Mediastinum/Nodes: No mediastinal lymphadenopathy. Upper normal 10 mm short axis lymph nodes are identified in both hilar  regions. The esophagus has normal imaging features. Moderate hiatal hernia. There is no axillary lymphadenopathy. Lungs/Pleura: Centrilobular and paraseptal emphysema evident. 7.3 x 6.6 cm rounded ill-defined masslike consolidative opacity identified posterior left upper lobe. Clustered ill-defined nodularity identified in the right lower lobe with dominant nodular component measuring up to 2.2 cm diameter on image 77/6. Discrete right lower lobe nodule measuring 13 mm identified on 100/6. Peripheral tree-in-bud nodularity identified in the posterior right costophrenic sulcus with associated peripheral small airway impaction. No substantial pleural effusion.  No pulmonary edema. Upper Abdomen: Pneumobilia suggests prior sphincterotomy, similar to prior. Nodular low-density thickening of the adrenal glands is compatible hyperplasia, potentially with superimposed benign left adrenal adenomas bilaterally. Musculoskeletal: No worrisome lytic or sclerotic osseous abnormality. Nonacute posterior right twelfth rib fracture evident with healed posterior right eighth rib fracture. Review of the MIP images confirms the above findings. IMPRESSION: 1. No CT evidence for acute pulmonary embolus. 2. 7.3 x 6.6 cm rounded ill-defined masslike consolidative opacity in the posterior left upper lobe. Imaging features are compatible with pneumonia although neoplasm is not excluded and close follow-up warranted. 3. Additional ill-defined nodular opacities identified in the right lower lobe with evidence of peripheral small airway impaction. Also likely infectious/inflammatory, close follow-up warranted. Aspiration not excluded. 4. Upper normal lymph nodes in both hilar regions, likely reactive. 5. 4.5 cm diameter ascending thoracic aortic aneurysm. Recommend semi-annual imaging followup by CTA or MRA and referral to cardiothoracic  surgery if not already obtained. This recommendation follows 2010  ACCF/AHA/AATS/ACR/ASA/SCA/SCAI/SIR/STS/SVM Guidelines for the Diagnosis and Management of Patients With Thoracic Aortic Disease. Circulation. 2010; 121: Z733-z630. Aortic aneurysm NOS (ICD10-I71.9) 6. Moderate hiatal hernia. 7. Aortic Atherosclerosis (ICD10-I70.0) and Emphysema (ICD10-J43.9). Electronically Signed   By: Camellia Candle M.D.   On: 03/12/2024 06:00   DG Chest Portable 1 View Result Date: 03/12/2024 EXAM: 1 VIEW(S) XRAY OF THE CHEST 03/12/2024 01:33:00 AM COMPARISON: Recent prior study. CLINICAL HISTORY: sob and chest pain FINDINGS: LINES, TUBES AND DEVICES: Left subclavian pacemaker. Right neck stimulator. LUNGS AND PLEURA: Focal patchy/masslike left mid lung opacity, new/progressive from recent prior, suggesting left perihilar pneumonia. No pleural effusion. No pneumothorax. HEART AND MEDIASTINUM: No acute abnormality of the cardiac and mediastinal silhouettes. BONES AND SOFT TISSUES: No acute osseous abnormality. IMPRESSION: 1. Focal patchy/masslike left mid lung opacity, new/progressive from recent prior, suggesting left perihilar pneumonia. Follow-up is suggested in 4 weeks to document resolution. Electronically signed by: Pinkie Pebbles MD 03/12/2024 01:36 AM EST RP Workstation: [id number]    EKG:   Independently reviewed.  Orders placed or performed during the hospital encounter of 03/12/24   ED EKG   ED EKG   EKG 12-Lead   EKG 12-Lead   EKG 12-Lead   *Note: Due to a large number of results and/or encounters for the requested time period, some results have not been displayed. A complete set of results can be found in Results Review.   ---------------------------------------------------------------------------------------------------------------------------------------    Assessment / Plan:   Principal Problem:   Pneumonia Active Problems:   Atypical chest pain   Acute on chronic combined systolic and diastolic CHF (congestive heart failure) (HCC)   CAD (coronary  artery disease)   Essential hypertension   Hyperlipidemia   ICD (implantable cardioverter-defibrillator) in place   COPD GOLD GRADE C   AF (atrial fibrillation) (HCC)   BPH (benign prostatic hyperplasia)   Anemia   B12 deficiency   Degenerative cervical spinal stenosis   Bilateral sensorineural hearing loss   Chronic idiopathic thrombocytopenia (HCC)   GERD (gastroesophageal reflux disease)   Major depressive disorder with single episode, in partial remission   History of abdominal aortic aneurysm (AAA)   Prediabetes   Assessment and Plan: * Pneumonia - Complaining of shortness of breath, dry cough Satting 100% on 3 L of oxygen  -Currently stable, afebrile, antihypertensive, WBC of 16.7 -Started on broad-spectrum antibiotics, due to allergy switched into Unasyn  and doxycycline  -Parent Tylenol , mucolytics, antiemetics Will encourage incentive spirometer and flutter valve -Will try to obtain sputum cultures -Will follow the blood cultures - Short burst of steroids  Acute on chronic combined systolic and diastolic CHF (congestive heart failure) (HCC) - Stable -Mildly hypotensive current BP 94/74, HR 95 BNP 197.6, minimal lower extremity edema-mild congestion -Review home medication: Currently listed Aldactone , Lasix , Coreg , aspirin , Repatha , mexiletine , Jardiance  .. . Will resume judiciously due to hypotension-once confirmed home meds   Atypical chest pain Brief left-sided chest pain-resolved Denies any chest pain at this moment -Troponin 30, 11, no acute changes on EKG  Chronic idiopathic thrombocytopenia (HCC) Currently stable monitoring  Bilateral sensorineural hearing loss Noted  Degenerative cervical spinal stenosis - No current complaint, denies any numbness or tingling, acute on chronic generalized weaknesses -Will consult PT/OT for evaluation  B12 deficiency Resume supplements when tolerating  Anemia Anemia of chronic disease with iron deficiency  anemia -Continue ferrous sulfate , H&H stable  BPH (benign prostatic hyperplasia) Monitoring for urinary retention, currently no complaint, continue Flomax   AF (atrial fibrillation) (HCC) Stable -will continue Coreg , mexiletine, amiodarone , and Eliquis   - Defibrillator in place  COPD GOLD GRADE C - Chronic condition,-compliant with oxygen  at baseline 3 L, satting 100% -Mild shortness of breath with no wheezing, initiated antibiotics, short burst of steroids - Encouraging incentive spirometer, flutter valve, mucolytics  ICD (implantable cardioverter-defibrillator) in place Assessed in place, no recent history of discharge -Patient reports no recent changes.  Stable  Hyperlipidemia - On Repatha   Essential hypertension Borderline hypotensive, monitoring home heart medications - Currently listed Meds spironolactone , Lasix , Coreg , amiodarone  .. Monitoring BP closely, restarting above meds judiciously   CAD (coronary artery disease) Stable, reviewing home medication resuming according Denies any chest pain  History of abdominal aortic aneurysm (AAA) Noted-stable  Major depressive disorder with single episode, in partial remission Currently stable, continue home medication of Lexapro   GERD (gastroesophageal reflux disease) Continue PPI  Prediabetes Last A1c 5 months ago 5.8 -Due to steroids, anticipating hyperglycemia -Will check CBG Q ACHS, SSI coverage     Consults called:  None -------------------------------------------------------------------------------------------------------------------------------------------- DVT prophylaxis:  SCDs Start: 03/12/24 0808 apixaban  (ELIQUIS ) tablet 5 mg   Code Status:   Code Status: Full Code   Admission status: Patient will be admitted as Inpatient, with a greater than 2 midnight length of stay. Level of care: Telemetry   Family Communication:  none at bedside  (The above findings and plan of care has been discussed with  patient in detail, the patient expressed understanding and agreement of above plan)  --------------------------------------------------------------------------------------------------------------------------------------------------  Disposition Plan:  Anticipated 1-2 days Status is: Inpatient Remains inpatient appropriate because: Needing IV antibiotics, respiratory support     ----------------------------------------------------------------------------------------------------------------------------------------------------  Time spent:  17  Min.  Was spent seeing and evaluating the patient, reviewing all medical records, drawn plan of care.  SIGNED: Adriana DELENA Grams, MD, FHM. FAAFP. [place] [facility], Pager  (Please use [email or web address] to page/ or secure chat through epic) If 7PM-7AM, please contact night-coverage [email or web address],  03/12/2024, 9:56 AM     [1]  Allergies Allergen Reactions   Sulfa Antibiotics Hives   Cephalexin  Itching

## 2024-03-12 NOTE — Assessment & Plan Note (Signed)
 Last A1c 5 months ago 5.8 -Due to steroids, anticipating hyperglycemia -Will check CBG Q ACHS, SSI coverage

## 2024-03-12 NOTE — ED Notes (Signed)
 Pr moved to a recliner for comfort

## 2024-03-12 NOTE — Assessment & Plan Note (Addendum)
-   Chronic condition,-compliant with oxygen  at baseline 3 L, satting 100% -Mild shortness of breath with no wheezing, initiated antibiotics, short burst of steroids - Encouraging incentive spirometer, flutter valve, mucolytics

## 2024-03-12 NOTE — Assessment & Plan Note (Signed)
 Resume supplements when tolerating

## 2024-03-12 NOTE — Assessment & Plan Note (Addendum)
 On Repatha

## 2024-03-12 NOTE — ED Notes (Signed)
 Pacemaker interrogated and given to Dr. Palumbo

## 2024-03-12 NOTE — Assessment & Plan Note (Signed)
 Currently stable monitoring

## 2024-03-12 NOTE — Assessment & Plan Note (Signed)
 Borderline hypotensive, monitoring home heart medications - Currently listed Meds spironolactone , Lasix , Coreg , amiodarone  .. Monitoring BP closely, restarting above meds judiciously

## 2024-03-12 NOTE — Assessment & Plan Note (Addendum)
-   Complaining of shortness of breath, dry cough Satting 100% on 3 L of oxygen  -Currently stable, afebrile, antihypertensive, WBC of 16.7 -Started on broad-spectrum antibiotics, due to allergy switched into Unasyn  and doxycycline  -Parent Tylenol , mucolytics, antiemetics Will encourage incentive spirometer and flutter valve -Will try to obtain sputum cultures -Will follow the blood cultures - Short burst of steroids

## 2024-03-12 NOTE — Assessment & Plan Note (Signed)
 Stable, reviewing home medication resuming according Denies any chest pain

## 2024-03-12 NOTE — Assessment & Plan Note (Signed)
 Monitoring for urinary retention, currently no complaint, continue Flomax 

## 2024-03-12 NOTE — ED Provider Notes (Signed)
 [place] EMERGENCY DEPARTMENT AT [facility] Provider Note   CSN: [id number] Arrival date & time: 03/12/24  9953     Patient presents with: Shortness of Breath and Chest Pain   Steven Guzman is a 82 y.o. male.   The history is provided by the patient.  Shortness of Breath Severity:  Severe Onset quality:  Gradual Duration:  1 week Timing:  Constant Progression:  Worsening Chronicity:  New Context: not animal exposure, not emotional upset and not fumes   Relieved by:  Nothing Worsened by:  Nothing Ineffective treatments:  None tried Associated symptoms: chest pain and wheezing   Associated symptoms: no fever   Associated symptoms comment:  Mucus production  Risk factors: tobacco use   Patient with AICD and COPD on 3 L home o2 presents with CP and SOB and wheezing for one week that is progressive.  Had stopped smoking then restarted and now has quit again.   Past Medical History:  Diagnosis Date   AICD (automatic cardioverter/defibrillator) present    [facility] Scientific MOMENTUM EL ICD D121/ [id number]   Aneurysm    a. Aneurysmal infrarenal aorta up to 33 mm on CT 10/2014, recommended f/u due 10/2017   Anginal pain    Anxiety    Arthritis    right foot   Basal cell carcinoma of nose    S/P MOHS   Biliary acute pancreatitis    CAD (coronary artery disease)    a. s/p MI in 1994 with PCI to LAD at that time b. cath 10/2012 demonstrated EF 30%, inferior akinesis with mild hypokinesis of all walls, patent LAD and RCA stents; ostial PDA with 80-90% obstruction with medical therapy recommended    Chronic systolic CHF (congestive heart failure) (HCC)    EF 30 to 35 % as of 09/2014.    CKD (chronic kidney disease), stage III (HCC)    Complication of anesthesia 10/2014   had to have defibrillator w/ERCP- CODED after having gallstones removed   COPD (chronic obstructive pulmonary disease) (HCC)    a. followed by pulmonary, COPD GOLD stage II   Depression     Diverticulosis of colon 07/2014   noted on CT   Dyspnea    with exertion   GERD (gastroesophageal reflux disease)    Hiatal hernia    large   History of kidney stones    passed stone   Hyperglycemia 10/2012   Hyperlipidemia    Hypertension    Myocardial infarction [facility]) 1994; 2011   Pneumonia 1946; 2015   Prostate enlargement 07/2014   observed on CT   Tobacco abuse    Ventricular tachycardia (HCC)    a. 08/2009 s/p BSX E110 Teligen 100 AICD, ser#: [id number];  b. 08/2008 VT req ATP - detection reprogrammed from 160 to 150. c. EPS and VT ablation by Dr. Waddell 12/21/2014       Prior to Admission medications  Medication Sig Start Date End Date Taking? Authorizing Provider  acetaminophen  (TYLENOL ) 500 MG tablet Take 1,000 mg by mouth every 6 (six) hours as needed for moderate pain.    [provider]  albuterol  (PROVENTIL ) (2.5 MG/3ML) 0.083% nebulizer solution USE 1 VIAL VIA NEBULIZER EVERY 6 HOURS AS NEEDED FOR WHEEZING OR SHORTNESS OF BREATH 07/18/19   Olalere, Adewale A, MD  amiodarone  (PACERONE ) 200 MG tablet Take 1 tablet (200 mg total) by mouth daily. \ 08/27/23   Lesia Ozell Barter, PA-C  Artificial Tear Solution (SOOTHE XP OP) Place 2 drops  into both eyes daily as needed (dry eyes).    [provider]  Ascorbic Acid (VITAMIN C) 1000 MG tablet Take 1,000 mg by mouth daily.    [provider]  aspirin  EC 81 MG tablet Take 1 tablet (81 mg total) by mouth daily. Swallow whole. 03/21/22   Meng, Hao, PA  Biotin  5000 MCG CAPS Take 5,000 mcg by mouth daily.    [provider]  Calcium  Citrate-Vitamin D  (CALCIUM  + D PO) Take 1 tablet by mouth daily.    [provider]  carvedilol  (COREG ) 3.125 MG tablet Take 1 tablet (3.125 mg total) by mouth 2 (two) times daily. 04/26/23   Rana Lum CROME, NP  cetirizine (ZYRTEC) 10 MG tablet Take 10 mg by mouth daily.    [provider]  Cholecalciferol  50 MCG (2000 UT) TABS Take 2,000 Units by  mouth daily at 6 (six) AM. 09/12/18   [provider]  Cyanocobalamin  (B12 LIQUID HEALTH BOOSTER PO) Take 5,000 mcg by mouth daily.    [provider]  Docusate Sodium  (DSS) 100 MG CAPS Take 2 capsules by mouth daily in the afternoon. 02/10/22   [provider]  ELIQUIS  5 MG TABS tablet TAKE 1 TABLET BY MOUTH TWICE A DAY 01/11/24   Croitoru, Mihai, MD  empagliflozin  (JARDIANCE ) 10 MG TABS tablet Take 1 tablet (10 mg total) by mouth daily. 04/19/23   Croitoru, Mihai, MD  escitalopram  (LEXAPRO ) 20 MG tablet Take 20 mg by mouth daily. 11/03/23   [provider]  Evolocumab  (REPATHA  SURECLICK) 140 MG/ML SOAJ INJECT 1 ML INTO THE SKIN EVERY 14 DAYS 01/10/24   Croitoru, Mihai, MD  fluticasone  (FLONASE ) 50 MCG/ACT nasal spray Place 2 sprays into both nostrils daily. 02/13/21   Fargo, Amy E, NP  furosemide  (LASIX ) 40 MG tablet Take 1 tablet (40 mg total) by mouth daily. 03/05/24 06/03/24  Fairy Frames, MD  guaiFENesin  (MUCINEX ) 600 MG 12 hr tablet Take 1 tablet by mouth 2 (two) times daily.    [provider]  Ipratropium-Albuterol  (COMBIVENT  RESPIMAT) 20-100 MCG/ACT AERS respimat Inhale 1 puff into the lungs every 6 (six) hours. Shortness of breath or wheezing 09/18/21   Fargo, Amy E, NP  IRON PO Take 1 tablet by mouth daily.    [provider]  Magnesium  200 MG TABS Take 400 mg by mouth daily.    [provider]  melatonin 5 MG TABS Take 10 mg by mouth at bedtime.    [provider]  mexiletine (MEXITIL ) 200 MG capsule TAKE 1 CAPSULE BY MOUTH 2 TIMES A DAY 02/17/24   Fountain, Madison L, NP  Multiple Vitamins-Minerals (CENTRUM ADULTS PO) Take 1 tablet by mouth daily.    [provider]  mupirocin  ointment (BACTROBAN ) 2 % Apply 1 Application topically daily as needed (wound care). 11/04/22   [provider]  nitroGLYCERIN  (NITROSTAT ) 0.4 MG SL tablet Place 1 tablet (0.4 mg total) under the tongue every 5 (five) minutes as  needed for chest pain. DISSOLVE 1 TABLET UNDER THE TONGUE EVERY 5 MINUTES FOR 3 DOSES 12/15/23   Court Dorn PARAS, MD  omeprazole (PRILOSEC) 20 MG capsule Take 20 mg by mouth daily.     [provider]  potassium chloride  20 MEQ TBCR Take 1 tablet (20 mEq total) by mouth daily. 03/05/24 06/03/24  Fairy Frames, MD  pregabalin  (LYRICA ) 25 MG capsule Take by mouth in the morning and at bedtime. 03/08/23   [provider]  Respiratory Therapy Supplies (  FLUTTER) DEVI Use as directed 03/16/17   McQuaid, Douglas B, MD  Spacer/Aero-Holding Chambers (OPTICHAMBER DIAMOND ) MISC optichamber [facility] 09/12/19   Olalere, Jennet LABOR, MD  spironolactone  (ALDACTONE ) 25 MG tablet Take 0.5 tablets (12.5 mg total) by mouth daily. 11/02/22   Emelia Josefa HERO, NP  STIOLTO RESPIMAT 2.5-2.5 MCG/ACT AERS Inhale 2 puffs into the lungs daily. 09/22/21   [provider]  tamsulosin  (FLOMAX ) 0.4 MG CAPS capsule TAKE ONE CAPSULE BY MOUTH DAILY AFTER SUPPER 06/30/21   Fargo, Amy E, NP  traMADol  (ULTRAM ) 50 MG tablet Take 50 mg by mouth every 12 (twelve) hours as needed for moderate pain (pain score 4-6) or severe pain (pain score 7-10). 11/04/23   [provider]    Allergies: Sulfa antibiotics and Cephalexin     Review of Systems  Constitutional:  Negative for fever.  HENT:  Negative for ear discharge.   Respiratory:  Positive for shortness of breath and wheezing.   Cardiovascular:  Positive for chest pain. Negative for palpitations and leg swelling.  All other systems reviewed and are negative.   Updated Vital Signs BP 94/74   Pulse 95   Temp 98.6 F (37 C) (Oral)   Resp 14   Ht 5' 10 (1.778 m)   Wt 65.7 kg   SpO2 94%   BMI 20.78 kg/m   Physical Exam Vitals and nursing note reviewed.  Constitutional:      General: He is not in acute distress.    Appearance: He is well-developed. He is not diaphoretic.  HENT:     Head: Normocephalic and atraumatic.     Nose: Nose normal.  Eyes:      Conjunctiva/sclera: Conjunctivae normal.     Pupils: Pupils are equal, round, and reactive to light.  Cardiovascular:     Rate and Rhythm: Normal rate and regular rhythm.     Pulses: Normal pulses.     Heart sounds: Normal heart sounds.  Pulmonary:     Effort: Pulmonary effort is normal.     Breath sounds: Wheezing and rhonchi present. No rales.  Abdominal:     General: Bowel sounds are normal.     Palpations: Abdomen is soft.     Tenderness: There is no abdominal tenderness. There is no guarding or rebound.  Musculoskeletal:        General: Normal range of motion.     Cervical back: Normal range of motion and neck supple.  Skin:    General: Skin is warm and dry.     Capillary Refill: Capillary refill takes less than 2 seconds.  Neurological:     General: No focal deficit present.     Mental Status: He is alert and oriented to person, place, and time.  Psychiatric:        Mood and Affect: Mood normal.     (all labs ordered are listed, but only abnormal results are displayed) Results for orders placed or performed during the hospital encounter of 03/12/24  Basic metabolic panel   Collection Time: 03/12/24  1:10 AM  Result Value Ref Range   Sodium 131 (L) 135 - 145 mmol/L   Potassium 4.9 3.5 - 5.1 mmol/L   Chloride 89 (L) 98 - 111 mmol/L   CO2 30 22 - 32 mmol/L   Glucose, Bld 150 (H) 70 - 99 mg/dL   BUN 20 8 - 23 mg/dL   Creatinine, Ser 9.12 0.61 - 1.24 mg/dL   Calcium  9.0 8.9 - 10.3 mg/dL   GFR, Estimated >  60 >60 mL/min   Anion gap 12 5 - 15  CBC   Collection Time: 03/12/24  1:10 AM  Result Value Ref Range   WBC 16.7 (H) 4.0 - 10.5 K/uL   RBC 3.83 (L) 4.22 - 5.81 MIL/uL   Hemoglobin 11.3 (L) 13.0 - 17.0 g/dL   HCT 62.4 (L) 60.9 - 47.9 %   MCV 97.9 80.0 - 100.0 fL   MCH 29.5 26.0 - 34.0 pg   MCHC 30.1 30.0 - 36.0 g/dL   RDW 80.1 (H) 88.4 - 84.4 %   Platelets 221 150 - 400 K/uL   nRBC 0.2 0.0 - 0.2 %  Brain natriuretic peptide   Collection Time: 03/12/24  1:10 AM   Result Value Ref Range   B Natriuretic Peptide 197.6 (H) 0.0 - 100.0 pg/mL  Troponin I (High Sensitivity)   Collection Time: 03/12/24  1:10 AM  Result Value Ref Range   Troponin I (High Sensitivity) 30 (H) <18 ng/L  Troponin I (High Sensitivity)   Collection Time: 03/12/24  3:10 AM  Result Value Ref Range   Troponin I (High Sensitivity) 11 <18 ng/L   *Note: Due to a large number of results and/or encounters for the requested time period, some results have not been displayed. A complete set of results can be found in Results Review.   CT Angio Chest PE W and/or Wo Contrast Result Date: 03/12/2024 CLINICAL DATA:  Shortness of breath and chest pain. Syncope. Clinical concern for pulmonary embolus. EXAM: CT ANGIOGRAPHY CHEST WITH CONTRAST TECHNIQUE: Multidetector CT imaging of the chest was performed using the standard protocol during bolus administration of intravenous contrast. Multiplanar CT image reconstructions and MIPs were obtained to evaluate the vascular anatomy. RADIATION DOSE REDUCTION: This exam was performed according to the departmental dose-optimization program which includes automated exposure control, adjustment of the mA and/or kV according to patient size and/or use of iterative reconstruction technique. CONTRAST:  75mL OMNIPAQUE  IOHEXOL  350 MG/ML SOLN COMPARISON:  02/25/2022 FINDINGS: Cardiovascular: Heart size upper normal. No substantial pericardial effusion. Coronary artery calcification is evident. Moderate atherosclerotic calcification is noted in the wall of the thoracic aorta. Ascending thoracic aorta measures 4.5 cm diameter. There is no filling defect within the opacified pulmonary arteries to suggest the presence of an acute pulmonary embolus. Left-sided permanent pacemaker/AICD evident. Battery pack for stimulator device overlies the upper right anterior hemithorax. Mediastinum/Nodes: No mediastinal lymphadenopathy. Upper normal 10 mm short axis lymph nodes are identified  in both hilar regions. The esophagus has normal imaging features. Moderate hiatal hernia. There is no axillary lymphadenopathy. Lungs/Pleura: Centrilobular and paraseptal emphysema evident. 7.3 x 6.6 cm rounded ill-defined masslike consolidative opacity identified posterior left upper lobe. Clustered ill-defined nodularity identified in the right lower lobe with dominant nodular component measuring up to 2.2 cm diameter on image 77/6. Discrete right lower lobe nodule measuring 13 mm identified on 100/6. Peripheral tree-in-bud nodularity identified in the posterior right costophrenic sulcus with associated peripheral small airway impaction. No substantial pleural effusion.  No pulmonary edema. Upper Abdomen: Pneumobilia suggests prior sphincterotomy, similar to prior. Nodular low-density thickening of the adrenal glands is compatible hyperplasia, potentially with superimposed benign left adrenal adenomas bilaterally. Musculoskeletal: No worrisome lytic or sclerotic osseous abnormality. Nonacute posterior right twelfth rib fracture evident with healed posterior right eighth rib fracture. Review of the MIP images confirms the above findings. IMPRESSION: 1. No CT evidence for acute pulmonary embolus. 2. 7.3 x 6.6 cm rounded ill-defined masslike consolidative opacity in the posterior left upper  lobe. Imaging features are compatible with pneumonia although neoplasm is not excluded and close follow-up warranted. 3. Additional ill-defined nodular opacities identified in the right lower lobe with evidence of peripheral small airway impaction. Also likely infectious/inflammatory, close follow-up warranted. Aspiration not excluded. 4. Upper normal lymph nodes in both hilar regions, likely reactive. 5. 4.5 cm diameter ascending thoracic aortic aneurysm. Recommend semi-annual imaging followup by CTA or MRA and referral to cardiothoracic surgery if not already obtained. This recommendation follows 2010  ACCF/AHA/AATS/ACR/ASA/SCA/SCAI/SIR/STS/SVM Guidelines for the Diagnosis and Management of Patients With Thoracic Aortic Disease. Circulation. 2010; 121: Z733-z630. Aortic aneurysm NOS (ICD10-I71.9) 6. Moderate hiatal hernia. 7. Aortic Atherosclerosis (ICD10-I70.0) and Emphysema (ICD10-J43.9). Electronically Signed   By: Camellia Candle M.D.   On: 03/12/2024 06:00   DG Chest Portable 1 View Result Date: 03/12/2024 EXAM: 1 VIEW(S) XRAY OF THE CHEST 03/12/2024 01:33:00 AM COMPARISON: Recent prior study. CLINICAL HISTORY: sob and chest pain FINDINGS: LINES, TUBES AND DEVICES: Left subclavian pacemaker. Right neck stimulator. LUNGS AND PLEURA: Focal patchy/masslike left mid lung opacity, new/progressive from recent prior, suggesting left perihilar pneumonia. No pleural effusion. No pneumothorax. HEART AND MEDIASTINUM: No acute abnormality of the cardiac and mediastinal silhouettes. BONES AND SOFT TISSUES: No acute osseous abnormality. IMPRESSION: 1. Focal patchy/masslike left mid lung opacity, new/progressive from recent prior, suggesting left perihilar pneumonia. Follow-up is suggested in 4 weeks to document resolution. Electronically signed by: Pinkie Pebbles MD 03/12/2024 01:36 AM EST RP Workstation: [id number]   ECHOCARDIOGRAM COMPLETE Result Date: 03/04/2024    ECHOCARDIOGRAM REPORT   Patient Name:   RAGAN REALE Date of Exam: 03/04/2024 Medical Rec #:  [id number]        Height:       71.0 in Accession #:    [id number]       Weight:       146.0 lb Date of Birth:  11-08-1940        BSA:          1.845 m Patient Age:    83 years         BP:           76/64 mmHg Patient Gender: M                HR:           85 bpm. Exam Location:  Inpatient Procedure: 2D Echo, Cardiac Doppler and Color Doppler (Both Spectral and Color            Flow Doppler were utilized during procedure). Indications:    CHF- Acute Systolic  History:        Patient has prior history of Echocardiogram examinations, most                  recent 08/11/2023. CHF, CAD, COPD, Arrythmias:Atrial                 Fibrillation; Risk Factors:Hypertension and Dyslipidemia.  Sonographer:    Sherlean Dubin Referring Phys: [id number] SUBRINA SUNDIL  Sonographer Comments: No subcostal window. Image acquisition challenging due to patient body habitus and Image acquisition challenging due to respiratory motion. IMPRESSIONS  1. Left ventricular ejection fraction, by estimation, is 35 to 40%. The left ventricle has moderately decreased function. Left ventricular endocardial border not optimally defined to evaluate regional wall motion. Left ventricular diastolic function could not be evaluated.  2. Right ventricular systolic function is mildly reduced. The right ventricular size is normal.  3. Right atrial size was mildly dilated.  4. The mitral valve is normal in structure. Trivial mitral valve regurgitation. No evidence of mitral stenosis.  5. The aortic valve is calcified. There is severe calcifcation of the aortic valve. There is severe thickening of the aortic valve. Aortic valve regurgitation is not visualized. Aortic valve sclerosis/calcification is present, without any evidence of aortic stenosis. Aortic valve Vmax measures 1.28 m/s.  6. Recommend limited study with definity  contrast to assess the LV apex for thrombus and assess focal wall motion. FINDINGS  Left Ventricle: Left ventricular ejection fraction, by estimation, is 35 to 40%. The left ventricle has moderately decreased function. Left ventricular endocardial border not optimally defined to evaluate regional wall motion. The left ventricular internal cavity size was normal in size. There is no left ventricular hypertrophy. Left ventricular diastolic function could not be evaluated. Right Ventricle: The right ventricular size is normal. No increase in right ventricular wall thickness. Right ventricular systolic function is mildly reduced. Left Atrium: Left atrial size was normal in size. Right Atrium:  Right atrial size was mildly dilated. Pericardium: There is no evidence of pericardial effusion. Mitral Valve: The mitral valve is normal in structure. Trivial mitral valve regurgitation. No evidence of mitral valve stenosis. Tricuspid Valve: The tricuspid valve is normal in structure. Tricuspid valve regurgitation is not demonstrated. No evidence of tricuspid stenosis. Aortic Valve: The aortic valve is calcified. There is severe calcifcation of the aortic valve. There is severe thickening of the aortic valve. Aortic valve regurgitation is not visualized. Aortic valve sclerosis/calcification is present, without any evidence of aortic stenosis. Aortic valve peak gradient measures 6.6 mmHg. Pulmonic Valve: The pulmonic valve was normal in structure. Pulmonic valve regurgitation is not visualized. No evidence of pulmonic stenosis. Aorta: The aortic root is normal in size and structure. Venous: The inferior vena cava was not well visualized. IAS/Shunts: No atrial level shunt detected by color flow Doppler. Additional Comments: A device lead is visualized.  LEFT VENTRICLE PLAX 2D LVIDd:         5.50 cm   Diastology LVIDs:         4.50 cm   LV e' medial:  9.79 cm/s LV PW:         1.20 cm   LV e' lateral: 7.62 cm/s LV IVS:        1.00 cm LVOT diam:     2.50 cm LV SV:         93 LV SV Index:   51 LVOT Area:     4.91 cm  RIGHT VENTRICLE RV Basal diam:  3.10 cm     PULMONARY VEINS RV Mid diam:    2.80 cm     Diastolic Velocity: 15.70 cm/s RV S prime:     10.30 cm/s  S/D Velocity:       1.30 TAPSE (M-mode): 1.6 cm      Systolic Velocity:  20.00 cm/s LEFT ATRIUM             Index        RIGHT ATRIUM           Index LA Vol (A2C):   55.4 ml 30.03 ml/m  RA Area:     19.60 cm LA Vol (A4C):   47.7 ml 25.86 ml/m  RA Volume:   63.00 ml  34.15 ml/m LA Biplane Vol: 52.1 ml 28.24 ml/m  AORTIC VALVE AV Area (Vmax): 3.36 cm AV Vmax:        128.00 cm/s AV Peak Grad:   6.6 mmHg  LVOT Vmax:      87.50 cm/s LVOT Vmean:     61.800 cm/s  LVOT VTI:       0.190 m  AORTA Ao Root diam: 3.40 cm Ao Asc diam:  3.10 cm  SHUNTS Systemic VTI:  0.19 m Systemic Diam: 2.50 cm Steven Bihari MD Electronically signed by Steven Bihari MD Signature Date/Time: 03/04/2024/5:17:00 PM    Final    DG Chest 2 View Result Date: 03/03/2024 CLINICAL DATA:  ShOB EXAM: CHEST - 2 VIEW COMPARISON:  11/12/2023 FINDINGS: Redemonstrated pulse stimulator device in the right chest with a single lead coursing into the right neck. No focal airspace consolidation, pleural effusion, or pneumothorax. Mild cardiomegaly. Left chest pacemaker/AICD with leads terminating in the right atrium and right ventricle. Tortuous aorta with aortic atherosclerosis. Osteopenia. Multilevel thoracic osteophytosis. IMPRESSION: No acute cardiopulmonary abnormality. Electronically Signed   By: Rogelia Myers M.D.   On: 03/03/2024 15:27    EKG: None  Radiology: CT Angio Chest PE W and/or Wo Contrast Result Date: 03/12/2024 CLINICAL DATA:  Shortness of breath and chest pain. Syncope. Clinical concern for pulmonary embolus. EXAM: CT ANGIOGRAPHY CHEST WITH CONTRAST TECHNIQUE: Multidetector CT imaging of the chest was performed using the standard protocol during bolus administration of intravenous contrast. Multiplanar CT image reconstructions and MIPs were obtained to evaluate the vascular anatomy. RADIATION DOSE REDUCTION: This exam was performed according to the departmental dose-optimization program which includes automated exposure control, adjustment of the mA and/or kV according to patient size and/or use of iterative reconstruction technique. CONTRAST:  75mL OMNIPAQUE  IOHEXOL  350 MG/ML SOLN COMPARISON:  02/25/2022 FINDINGS: Cardiovascular: Heart size upper normal. No substantial pericardial effusion. Coronary artery calcification is evident. Moderate atherosclerotic calcification is noted in the wall of the thoracic aorta. Ascending thoracic aorta measures 4.5 cm diameter. There is no filling  defect within the opacified pulmonary arteries to suggest the presence of an acute pulmonary embolus. Left-sided permanent pacemaker/AICD evident. Battery pack for stimulator device overlies the upper right anterior hemithorax. Mediastinum/Nodes: No mediastinal lymphadenopathy. Upper normal 10 mm short axis lymph nodes are identified in both hilar regions. The esophagus has normal imaging features. Moderate hiatal hernia. There is no axillary lymphadenopathy. Lungs/Pleura: Centrilobular and paraseptal emphysema evident. 7.3 x 6.6 cm rounded ill-defined masslike consolidative opacity identified posterior left upper lobe. Clustered ill-defined nodularity identified in the right lower lobe with dominant nodular component measuring up to 2.2 cm diameter on image 77/6. Discrete right lower lobe nodule measuring 13 mm identified on 100/6. Peripheral tree-in-bud nodularity identified in the posterior right costophrenic sulcus with associated peripheral small airway impaction. No substantial pleural effusion.  No pulmonary edema. Upper Abdomen: Pneumobilia suggests prior sphincterotomy, similar to prior. Nodular low-density thickening of the adrenal glands is compatible hyperplasia, potentially with superimposed benign left adrenal adenomas bilaterally. Musculoskeletal: No worrisome lytic or sclerotic osseous abnormality. Nonacute posterior right twelfth rib fracture evident with healed posterior right eighth rib fracture. Review of the MIP images confirms the above findings. IMPRESSION: 1. No CT evidence for acute pulmonary embolus. 2. 7.3 x 6.6 cm rounded ill-defined masslike consolidative opacity in the posterior left upper lobe. Imaging features are compatible with pneumonia although neoplasm is not excluded and close follow-up warranted. 3. Additional ill-defined nodular opacities identified in the right lower lobe with evidence of peripheral small airway impaction. Also likely infectious/inflammatory, close follow-up  warranted. Aspiration not excluded. 4. Upper normal lymph nodes in both hilar regions, likely reactive. 5. 4.5 cm diameter ascending thoracic aortic aneurysm. Recommend  semi-annual imaging followup by CTA or MRA and referral to cardiothoracic surgery if not already obtained. This recommendation follows 2010 ACCF/AHA/AATS/ACR/ASA/SCA/SCAI/SIR/STS/SVM Guidelines for the Diagnosis and Management of Patients With Thoracic Aortic Disease. Circulation. 2010; 121: Z733-z630. Aortic aneurysm NOS (ICD10-I71.9) 6. Moderate hiatal hernia. 7. Aortic Atherosclerosis (ICD10-I70.0) and Emphysema (ICD10-J43.9). Electronically Signed   By: Camellia Candle M.D.   On: 03/12/2024 06:00   DG Chest Portable 1 View Result Date: 03/12/2024 EXAM: 1 VIEW(S) XRAY OF THE CHEST 03/12/2024 01:33:00 AM COMPARISON: Recent prior study. CLINICAL HISTORY: sob and chest pain FINDINGS: LINES, TUBES AND DEVICES: Left subclavian pacemaker. Right neck stimulator. LUNGS AND PLEURA: Focal patchy/masslike left mid lung opacity, new/progressive from recent prior, suggesting left perihilar pneumonia. No pleural effusion. No pneumothorax. HEART AND MEDIASTINUM: No acute abnormality of the cardiac and mediastinal silhouettes. BONES AND SOFT TISSUES: No acute osseous abnormality. IMPRESSION: 1. Focal patchy/masslike left mid lung opacity, new/progressive from recent prior, suggesting left perihilar pneumonia. Follow-up is suggested in 4 weeks to document resolution. Electronically signed by: Pinkie Pebbles MD 03/12/2024 01:36 AM EST RP Workstation: [id number]     Procedures   Medications Ordered in the ED  azithromycin  (ZITHROMAX ) 500 mg in sodium chloride  0.9 % 250 mL IVPB (500 mg Intravenous New Bag/Given 03/12/24 0540)  albuterol  (PROVENTIL ) (2.5 MG/3ML) 0.083% nebulizer solution 5 mg (has no administration in time range)  ipratropium-albuterol  (DUONEB) 0.5-2.5 (3) MG/3ML nebulizer solution 3 mL (3 mLs Nebulization Given 03/12/24 0349)   methylPREDNISolone  sodium succinate (SOLU-MEDROL ) 125 mg/2 mL injection 125 mg (125 mg Intravenous Given 03/12/24 0534)  iohexol  (OMNIPAQUE ) 350 MG/ML injection 75 mL (75 mLs Intravenous Contrast Given 03/12/24 0526)                                    Medical Decision Making Patient with CP and SOB on o2  Amount and/or Complexity of Data Reviewed External Data Reviewed: notes.    Details: Previous notes reviewed  Labs: ordered.    Details: Troponin is slightly elevated 30, second is 11.  White count is elevated 16.7, low hemoglobin 11.3, normal platelets. Slight low sodium 131, normal potassium, normal creatinine  Radiology: ordered.  Risk Prescription drug management. Decision regarding hospitalization.     Final diagnoses:  COPD exacerbation (HCC)  Lung mass  Post-obstructive pneumonia due to foreign body aspiration   The patient appears reasonably stabilized for admission considering the current resources, flow, and capabilities available in the ED at this time, and I doubt any other [facility] requiring further screening and/or treatment in the ED prior to admission.  ED Discharge Orders     None          Don Tiu, MD 03/12/24 [phone]

## 2024-03-12 NOTE — Assessment & Plan Note (Signed)
 Continue PPI.

## 2024-03-12 NOTE — Care Management (Signed)
 Just discharged home last week, had oxygen  by [employer] and is active with [facility] for Home health  IPCM will follow

## 2024-03-12 NOTE — ED Provider Triage Note (Signed)
 Emergency Medicine Provider Triage Evaluation Note  Steven Guzman , a 83 y.o. male  was evaluated in triage.  Pt complains of worsening SOB.  On 2L [place] that she started about a week ago.  Feels like his SOB is worsening.  Review of Systems  Positive: SOB Negative: fever  Physical Exam  BP 129/80   Pulse (!) 109   Temp 98.5 F (36.9 C)   Resp (!) 22   Ht 5' 10 (1.778 m)   Wt 65.7 kg   SpO2 96%   BMI 20.78 kg/m  Gen:   Awake, no distress   Resp:  Normal effort  MSK:   Moves extremities without difficulty  Other:    Medical Decision Making  Medically screening exam initiated at 1:58 AM.  Appropriate orders placed.  Steven Guzman was informed that the remainder of the evaluation will be completed by another provider, this initial triage assessment does not replace that evaluation, and the importance of remaining in the ED until their evaluation is complete.     Steven Charleston, PA-C 03/12/24 9840

## 2024-03-13 ENCOUNTER — Other Ambulatory Visit (HOSPITAL_COMMUNITY): Payer: Self-pay

## 2024-03-13 ENCOUNTER — Telehealth: Payer: Self-pay

## 2024-03-13 LAB — GLUCOSE, CAPILLARY: Glucose-Capillary: 122 mg/dL — ABNORMAL HIGH (ref 70–99)

## 2024-03-13 LAB — CBG MONITORING, ED
Glucose-Capillary: 131 mg/dL — ABNORMAL HIGH (ref 70–99)
Glucose-Capillary: 137 mg/dL — ABNORMAL HIGH (ref 70–99)
Glucose-Capillary: 126 mg/dL — ABNORMAL HIGH (ref 70–99)

## 2024-03-13 LAB — BASIC METABOLIC PANEL WITH GFR
Anion gap: 10 (ref 5–15)
BUN: 12 mg/dL (ref 8–23)
CO2: 27 mmol/L (ref 22–32)
Calcium: 8.3 mg/dL — ABNORMAL LOW (ref 8.9–10.3)
Chloride: 91 mmol/L — ABNORMAL LOW (ref 98–111)
Creatinine, Ser: 0.57 mg/dL — ABNORMAL LOW (ref 0.61–1.24)
GFR, Estimated: >60 mL/min (ref >60)
Glucose, Bld: 103 mg/dL — ABNORMAL HIGH (ref 70–99)
Potassium: 4.1 mmol/L (ref 3.5–5.1)
Sodium: 128 mmol/L — ABNORMAL LOW (ref 135–145)

## 2024-03-13 LAB — CBC
HCT: 32.3 % — ABNORMAL LOW (ref 39.0–52.0)
Hemoglobin: 9.8 g/dL — ABNORMAL LOW (ref 13.0–17.0)
MCH: 29.6 pg (ref 26.0–34.0)
MCHC: 30.3 g/dL (ref 30.0–36.0)
MCV: 97.6 fL (ref 80.0–100.0)
Platelets: 162 K/uL (ref 150–400)
RBC: 3.31 MIL/uL — ABNORMAL LOW (ref 4.22–5.81)
RDW: 19.3 % — ABNORMAL HIGH (ref 11.5–15.5)
WBC: 16.3 K/uL — ABNORMAL HIGH (ref 4.0–10.5)
nRBC: 0.1 % (ref 0.0–0.2)

## 2024-03-13 LAB — HIV ANTIBODY (ROUTINE TESTING W REFLEX): HIV Screen 4th Generation wRfx: NONREACTIVE

## 2024-03-13 LAB — LACTIC ACID, PLASMA: Lactic Acid, Venous: 0.9 mmol/L (ref 0.5–1.9)

## 2024-03-13 LAB — PROTIME-INR
INR: 1.7 — ABNORMAL HIGH (ref 0.8–1.2)
Prothrombin Time: 20.4 s — ABNORMAL HIGH (ref 11.4–15.2)

## 2024-03-13 MED ORDER — ARFORMOTEROL TARTRATE 15 MCG/2ML IN NEBU
15.0000 ug | INHALATION_SOLUTION | Freq: Two times a day (BID) | RESPIRATORY_TRACT | Status: DC
  Administered 2024-03-13 – 2024-03-24 (×21): 15 ug via RESPIRATORY_TRACT
  Filled 2024-03-13 (×22): qty 2

## 2024-03-13 MED ORDER — LIDOCAINE 5 % EX PTCH
1.0000 | MEDICATED_PATCH | CUTANEOUS | Status: DC
  Administered 2024-03-13 – 2024-03-15 (×3): 1 via TRANSDERMAL
  Filled 2024-03-13 (×10): qty 1

## 2024-03-13 MED ORDER — UMECLIDINIUM BROMIDE 62.5 MCG/ACT IN AEPB
1.0000 | INHALATION_SPRAY | Freq: Every day | RESPIRATORY_TRACT | Status: DC
  Administered 2024-03-14 – 2024-03-24 (×11): 1 via RESPIRATORY_TRACT
  Filled 2024-03-13 (×2): qty 7

## 2024-03-13 MED ORDER — PREGABALIN 25 MG PO CAPS
25.0000 mg | ORAL_CAPSULE | Freq: Two times a day (BID) | ORAL | Status: DC
  Administered 2024-03-13 – 2024-03-24 (×23): 25 mg via ORAL
  Filled 2024-03-13 (×23): qty 1

## 2024-03-13 NOTE — ED Notes (Signed)
 Patient pulled up in bed by sheet. Upon sitting him up this RN and tech noticed a skin tear by his armpit. Skin tear looked to have been done last night, as the blood was dry. Wound dressed.

## 2024-03-13 NOTE — Progress Notes (Signed)
 PROGRESS NOTE    Steven Guzman  FMW:[id number] DOB: Aug 28, 1940 DOA: 03/12/2024 PCP: Esmeralda Morton SAUNDERS, PA-C    Brief Narrative:  Steven Guzman is a 83 yr old man with HTN, COPD, chronic hypoxic respiratory failure, CAD, HFrEF, and VT with ICD presenting with pleuritic chest pain and SOB.  Patient reports a recent hospitalization, posthospitalization remain really weak, developed left-sided chest pain with shortness of breath overnight.  CTA is negative for PE,7.3 x 6.6 cm rounded ill-defined masslike consolidative opacity - pneumonia (vs neoplasm). 4.5 cm diameter ascending thoracic aortic aneurysm.     Assessment and Plan: * Pneumonia - Complaining of shortness of breath, dry cough Satting 100% on 3 L of oxygen -wean to room air as able (was discharged home 1 week ago on nasal cannula but was not on prior) - Continue antibiotics - incentive spirometer and flutter valve -obtain sputum cultures - Continue steroids for now  Hyponatremia - Trend with daily labs - If continues to drop will start workup  Acute on chronic combined systolic and diastolic CHF (congestive heart failure) (HCC) - Stable - Will resume judiciously due to hypotension-once confirmed home meds   Atypical chest pain Brief left-sided chest pain-resolved  Chronic idiopathic thrombocytopenia (HCC) Currently stable monitoring  Bilateral sensorineural hearing loss Noted  Degenerative cervical spinal stenosis - No current complaint, denies any numbness or tingling, acute on chronic generalized weaknesses -Will consult PT/OT for evaluation  B12 deficiency Resume supplements   Anemia Anemia of chronic disease with iron deficiency anemia -Continue ferrous sulfate , H&H stable  BPH (benign prostatic hyperplasia) Monitoring for urinary retention, currently no complaint, continue Flomax   AF (atrial fibrillation) (HCC) Stable -will continue Coreg , mexiletine, amiodarone , and Eliquis   - Defibrillator in  place  COPD GOLD GRADE C - Chronic condition,-compliant with oxygen  at baseline 3 L, satting 100% -Mild shortness of breath with no wheezing, initiated antibiotics, short burst of steroids - Encouraging incentive spirometer, flutter valve, mucolytics  ICD (implantable cardioverter-defibrillator) in place Assessed in place, no recent history of discharge -Patient reports no recent changes.  Stable  Hyperlipidemia - On Repatha   Essential hypertension -hypotensive, golding home heart medications - Currently listed Meds spironolactone , Lasix , Coreg , amiodarone  .. Monitoring BP closely, restarting above meds judiciously   CAD (coronary artery disease) Stable, reviewing home medication resuming according Denies any chest pain  History of abdominal aortic aneurysm (AAA) Noted-stable  Major depressive disorder with single episode, in partial remission Currently stable, continue home medication of Lexapro   GERD (gastroesophageal reflux disease) Continue PPI  Prediabetes Last A1c 5 months ago 5.8 -Due to steroids, anticipating hyperglycemia - SSI coverage     DVT prophylaxis: SCDs Start: 03/12/24 0808 apixaban  (ELIQUIS ) tablet 5 mg    Code Status: Full Code   Disposition Plan:  Level of care: Telemetry Status is: Inpatient     Consultants:     Subjective: After discharge last week, spent a lot of time in bed  Objective: Vitals:   03/13/24 0845 03/13/24 0900 03/13/24 1004 03/13/24 1030  BP:    114/73  Pulse:  98  98  Resp: (!) 24 (!) 31  17  Temp:   (!) 97.5 F (36.4 C)   TempSrc:   Oral   SpO2: 90% 96%  92%  Weight:      Height:        Intake/Output Summary (Last 24 hours) at 03/13/2024 1049 Last data filed at 03/13/2024 0701 Gross per 24 hour  Intake --  Output 800 ml  Net -800 ml   Filed Weights   03/12/24 0102  Weight: 65.7 kg    Examination:   General: Appearance:    Well developed, well nourished male in no acute distress     Lungs:    On nasal cannula respirations unlabored  Heart:    Normal heart rate.   MS:   All extremities are intact.    Neurologic:   Awake, alert, oriented x 3. No apparent focal neurological           defect.        Data Reviewed: I have personally reviewed following labs and imaging studies  CBC: Recent Labs  Lab 03/12/24 0110 03/13/24 0326  WBC 16.7* 16.3*  HGB 11.3* 9.8*  HCT 37.5* 32.3*  MCV 97.9 97.6  PLT 221 162   Basic Metabolic Panel: Recent Labs  Lab 03/12/24 0110 03/12/24 0811 03/13/24 0326  NA 131*  --  128*  K 4.9  --  4.1  CL 89*  --  91*  CO2 30  --  27  GLUCOSE 150*  --  103*  BUN 20  --  12  CREATININE 0.87  --  0.57*  CALCIUM  9.0  --  8.3*  MG  --  1.8  --   PHOS  --  2.9  --    GFR: Estimated Creatinine Clearance: 62.7 mL/min (A) (by C-G formula based on SCr of 0.57 mg/dL (L)). Liver Function Tests: No results for input(s): AST, ALT, ALKPHOS, BILITOT, PROT, ALBUMIN in the last 168 hours. No results for input(s): LIPASE, AMYLASE in the last 168 hours. No results for input(s): AMMONIA in the last 168 hours. Coagulation Profile: Recent Labs  Lab 03/13/24 0326  INR 1.7*   Cardiac Enzymes: No results for input(s): CKTOTAL, CKMB, CKMBINDEX, TROPONINI in the last 168 hours. BNP (last 3 results) No results for input(s): PROBNP in the last 8760 hours. HbA1C: Recent Labs    03/12/24 1002  HGBA1C 5.2   CBG: Recent Labs  Lab 03/12/24 1233 03/12/24 1834 03/13/24 0755  GLUCAP 159* 300* 131*   Lipid Profile: No results for input(s): CHOL, HDL, LDLCALC, TRIG, CHOLHDL, LDLDIRECT in the last 72 hours. Thyroid  Function Tests: No results for input(s): TSH, T4TOTAL, FREET4, T3FREE, THYROIDAB in the last 72 hours. Anemia Panel: No results for input(s): VITAMINB12, FOLATE, FERRITIN, TIBC, IRON, RETICCTPCT in the last 72 hours. Sepsis Labs: Recent Labs  Lab 03/12/24 0811 03/13/24 0326   PROCALCITON <0.10  --   LATICACIDVEN  --  0.9    Recent Results (from the past 240 hours)  Resp panel by RT-PCR (RSV, Flu A&B, Covid) Anterior Nasal Swab     Status: None   Collection Time: 03/03/24  2:33 PM   Specimen: Anterior Nasal Swab  Result Value Ref Range Status   SARS Coronavirus 2 by RT PCR NEGATIVE NEGATIVE Final   Influenza A by PCR NEGATIVE NEGATIVE Final   Influenza B by PCR NEGATIVE NEGATIVE Final    Comment: (NOTE) The Xpert Xpress SARS-CoV-2/FLU/RSV plus assay is intended as an aid in the diagnosis of influenza from Nasopharyngeal swab specimens and should not be used as a sole basis for treatment. Nasal washings and aspirates are unacceptable for Xpert Xpress SARS-CoV-2/FLU/RSV testing.  Fact Sheet for Patients: bloggercourse.com  Fact Sheet for Healthcare Providers: seriousbroker.it  This test is not yet approved or cleared by the [place]  FDA and has been authorized for detection and/or diagnosis of SARS-CoV-2 by FDA under an Emergency Use  Authorization (EUA). This EUA will remain in effect (meaning this test can be used) for the duration of the COVID-19 declaration under Section 564(b)(1) of the Act, 21 U.S.C. section 360bbb-3(b)(1), unless the authorization is terminated or revoked.     Resp Syncytial Virus by PCR NEGATIVE NEGATIVE Final    Comment: (NOTE) Fact Sheet for Patients: bloggercourse.com  Fact Sheet for Healthcare Providers: seriousbroker.it  This test is not yet approved or cleared by the [place]  FDA and has been authorized for detection and/or diagnosis of SARS-CoV-2 by FDA under an Emergency Use Authorization (EUA). This EUA will remain in effect (meaning this test can be used) for the duration of the COVID-19 declaration under Section 564(b)(1) of the Act, 21 U.S.C. section 360bbb-3(b)(1), unless the authorization is  terminated or revoked.  Performed at [facility], [address] [place], [place] [address]   Culture, blood (routine x 2) Call MD if unable to obtain prior to antibiotics being given     Status: None (Preliminary result)   Collection Time: 03/12/24  8:11 AM   Specimen: BLOOD  Result Value Ref Range Status   Specimen Description BLOOD RIGHT ANTECUBITAL  Final   Special Requests   Final    BOTTLES DRAWN AEROBIC AND ANAEROBIC Blood Culture results may not be optimal due to an inadequate volume of blood received in culture bottles   Culture   Final    NO GROWTH < 24 HOURS Performed at [facility], [address], [place], [place] [address]    Report Status PENDING  Incomplete  Culture, blood (routine x 2) Call MD if unable to obtain prior to antibiotics being given     Status: None (Preliminary result)   Collection Time: 03/12/24 10:59 AM   Specimen: BLOOD RIGHT ARM  Result Value Ref Range Status   Specimen Description BLOOD RIGHT ARM  Final   Special Requests   Final    BOTTLES DRAWN AEROBIC AND ANAEROBIC Blood Culture adequate volume   Culture   Final    NO GROWTH < 24 HOURS Performed at [facility], [address], [place], [place] [address]    Report Status PENDING  Incomplete         Radiology Studies: CT Angio Chest PE W and/or Wo Contrast Result Date: 03/12/2024 CLINICAL DATA:  Shortness of breath and chest pain. Syncope. Clinical concern for pulmonary embolus. EXAM: CT ANGIOGRAPHY CHEST WITH CONTRAST TECHNIQUE: Multidetector CT imaging of the chest was performed using the standard protocol during bolus administration of intravenous contrast. Multiplanar CT image reconstructions and MIPs were obtained to evaluate the vascular anatomy. RADIATION DOSE REDUCTION: This exam was performed according to the departmental dose-optimization program which includes automated exposure control, adjustment of the mA and/or kV according to patient size and/or use of  iterative reconstruction technique. CONTRAST:  75mL OMNIPAQUE  IOHEXOL  350 MG/ML SOLN COMPARISON:  02/25/2022 FINDINGS: Cardiovascular: Heart size upper normal. No substantial pericardial effusion. Coronary artery calcification is evident. Moderate atherosclerotic calcification is noted in the wall of the thoracic aorta. Ascending thoracic aorta measures 4.5 cm diameter. There is no filling defect within the opacified pulmonary arteries to suggest the presence of an acute pulmonary embolus. Left-sided permanent pacemaker/AICD evident. Battery pack for stimulator device overlies the upper right anterior hemithorax. Mediastinum/Nodes: No mediastinal lymphadenopathy. Upper normal 10 mm short axis lymph nodes are identified in both hilar regions. The esophagus has normal imaging features. Moderate hiatal hernia. There is no axillary lymphadenopathy. Lungs/Pleura: Centrilobular  and paraseptal emphysema evident. 7.3 x 6.6 cm rounded ill-defined masslike consolidative opacity identified posterior left upper lobe. Clustered ill-defined nodularity identified in the right lower lobe with dominant nodular component measuring up to 2.2 cm diameter on image 77/6. Discrete right lower lobe nodule measuring 13 mm identified on 100/6. Peripheral tree-in-bud nodularity identified in the posterior right costophrenic sulcus with associated peripheral small airway impaction. No substantial pleural effusion.  No pulmonary edema. Upper Abdomen: Pneumobilia suggests prior sphincterotomy, similar to prior. Nodular low-density thickening of the adrenal glands is compatible hyperplasia, potentially with superimposed benign left adrenal adenomas bilaterally. Musculoskeletal: No worrisome lytic or sclerotic osseous abnormality. Nonacute posterior right twelfth rib fracture evident with healed posterior right eighth rib fracture. Review of the MIP images confirms the above findings. IMPRESSION: 1. No CT evidence for acute pulmonary embolus. 2.  7.3 x 6.6 cm rounded ill-defined masslike consolidative opacity in the posterior left upper lobe. Imaging features are compatible with pneumonia although neoplasm is not excluded and close follow-up warranted. 3. Additional ill-defined nodular opacities identified in the right lower lobe with evidence of peripheral small airway impaction. Also likely infectious/inflammatory, close follow-up warranted. Aspiration not excluded. 4. Upper normal lymph nodes in both hilar regions, likely reactive. 5. 4.5 cm diameter ascending thoracic aortic aneurysm. Recommend semi-annual imaging followup by CTA or MRA and referral to cardiothoracic surgery if not already obtained. This recommendation follows 2010 ACCF/AHA/AATS/ACR/ASA/SCA/SCAI/SIR/STS/SVM Guidelines for the Diagnosis and Management of Patients With Thoracic Aortic Disease. Circulation. 2010; 121: Z733-z630. Aortic aneurysm NOS (ICD10-I71.9) 6. Moderate hiatal hernia. 7. Aortic Atherosclerosis (ICD10-I70.0) and Emphysema (ICD10-J43.9). Electronically Signed   By: Camellia Candle M.D.   On: 03/12/2024 06:00   DG Chest Portable 1 View Result Date: 03/12/2024 EXAM: 1 VIEW(S) XRAY OF THE CHEST 03/12/2024 01:33:00 AM COMPARISON: Recent prior study. CLINICAL HISTORY: sob and chest pain FINDINGS: LINES, TUBES AND DEVICES: Left subclavian pacemaker. Right neck stimulator. LUNGS AND PLEURA: Focal patchy/masslike left mid lung opacity, new/progressive from recent prior, suggesting left perihilar pneumonia. No pleural effusion. No pneumothorax. HEART AND MEDIASTINUM: No acute abnormality of the cardiac and mediastinal silhouettes. BONES AND SOFT TISSUES: No acute osseous abnormality. IMPRESSION: 1. Focal patchy/masslike left mid lung opacity, new/progressive from recent prior, suggesting left perihilar pneumonia. Follow-up is suggested in 4 weeks to document resolution. Electronically signed by: Pinkie Pebbles MD 03/12/2024 01:36 AM EST RP Workstation: [id number]         Scheduled Meds:  albuterol   2.5 mg Nebulization Q6H   amiodarone   200 mg Oral Daily   apixaban   5 mg Oral BID   aspirin  EC  81 mg Oral Daily   carvedilol   3.125 mg Oral BID   doxycycline   100 mg Oral Q12H   escitalopram   20 mg Oral Daily   ferrous sulfate    Oral Daily   guaiFENesin   1,200 mg Oral Q8H   insulin  aspart  0-9 Units Subcutaneous TID WC   lactobacillus  1 g Oral TID WC   lidocaine   1 patch Transdermal Q24H   magnesium  oxide  400 mg Oral Daily   melatonin  3 mg Oral QHS   methylPREDNISolone  (SOLU-MEDROL ) injection  40 mg Intravenous Q24H   mexiletine  200 mg Oral BID   pantoprazole   40 mg Oral Daily   sodium chloride  flush  3 mL Intravenous Q12H   sodium chloride  flush  3 mL Intravenous Q12H   tamsulosin   0.4 mg Oral Daily   Continuous Infusions:  ampicillin -sulbactam (UNASYN ) IV Stopped (03/13/24 1018)  LOS: 1 day    Time spent: 45 minutes spent on chart review, discussion with nursing staff, consultants, updating family and interview/physical exam; more than 50% of that time was spent in counseling and/or coordination of care.    Harlene RAYMOND Bowl, DO [facility] Available via Epic secure chat 7am-7pm After these hours, please refer to coverage provider listed on [email or web address] 03/13/2024, 10:49 AM

## 2024-03-13 NOTE — Evaluation (Signed)
 Occupational Therapy Evaluation Patient Details Name: Steven Guzman MRN: [id number] DOB: Aug 24, 1940 Today's Date: 03/13/2024   History of Present Illness   83 yo M adm 12/14 SOB and CP. Pt with workup for COPD exacerbation lung mass and PNA PMH COPD, CHF, CAD MI, aortic aneurysm, ACDF,  chronic neck/back pain, anemia, depression, chronic idiopathic thrombocytopenia,     Clinical Impressions Patient admitted for the diagnosis above.  PTA he lives at home in a one bedroom apartment with his spouse.  His wife is currently at a SNF undergoing rehab post fall, and he was about to start [facility] PT.  Patient would like to return home and continue [facility] rehab, SNF may be needed, depends on how his does once up on the regular floor, and not the ED.  OT will continue efforts in the acute setting to address deficits, and HH OT will be recommended for now.  PT Consult is pending.       If plan is discharge home, recommend the following:   Assist for transportation;A little help with walking and/or transfers;A little help with bathing/dressing/bathroom     Functional Status Assessment   Patient has had a recent decline in their functional status and demonstrates the ability to make significant improvements in function in a reasonable and predictable amount of time.     Equipment Recommendations   None recommended by OT     Recommendations for Other Services         Precautions/Restrictions   Precautions Precautions: Fall Recall of Precautions/Restrictions: Intact Restrictions Weight Bearing Restrictions Per Provider Order: No     Mobility Bed Mobility Overal bed mobility: Needs Assistance Bed Mobility: Supine to Sit, Sit to Supine     Supine to sit: Supervision Sit to supine: Supervision     Patient Response: Cooperative  Transfers Overall transfer level: Needs assistance Equipment used: Rolling walker (2 wheels) Transfers: Sit to/from Stand, Bed to  chair/wheelchair/BSC Sit to Stand: Supervision, Contact guard assist     Step pivot transfers: Contact guard assist            Balance Overall balance assessment: Needs assistance Sitting-balance support: Feet supported Sitting balance-Leahy Scale: Good     Standing balance support: Reliant on assistive device for balance Standing balance-Leahy Scale: Poor                             ADL either performed or assessed with clinical judgement   ADL Overall ADL's : Needs assistance/impaired Eating/Feeding: Set up   Grooming: Wash/dry hands;Wash/dry face;Set up;Sitting   Upper Body Bathing: Contact guard assist;Sitting   Lower Body Bathing: Minimal assistance;Sit to/from stand   Upper Body Dressing : Contact guard assist;Sitting   Lower Body Dressing: Minimal assistance;Sit to/from stand   Toilet Transfer: Contact guard assist;Stand-pivot;BSC/3in1;Rolling walker (2 wheels)             General ADL Comments: Initial LOB back with first stand     Vision Patient Visual Report: No change from baseline       Perception Perception: Not tested       Praxis Praxis: Not tested       Pertinent Vitals/Pain Pain Assessment Pain Assessment: Faces Faces Pain Scale: Hurts a little bit Pain Location: Sacral area Pain Descriptors / Indicators: Aching Pain Intervention(s): Monitored during session     Extremity/Trunk Assessment Upper Extremity Assessment Upper Extremity Assessment: Overall WFL for tasks assessed   Lower Extremity Assessment Lower  Extremity Assessment: Defer to PT evaluation   Cervical / Trunk Assessment Cervical / Trunk Assessment: Kyphotic   Communication Communication Communication: No apparent difficulties Factors Affecting Communication: Hearing impaired   Cognition Arousal: Alert Behavior During Therapy: WFL for tasks assessed/performed Cognition: No apparent impairments                               Following  commands: Intact       Cueing  General Comments       HR to 113 with mobility   Exercises     Shoulder Instructions      Home Living Family/patient expects to be discharged to:: Private residence Living Arrangements: Spouse/significant other Available Help at Discharge: Family;Available 24 hours/day Type of Home: Apartment Home Access: Level entry     Home Layout: One level     Bathroom Shower/Tub: Chief Strategy Officer: Handicapped height Bathroom Accessibility: Yes How Accessible: Accessible via walker Home Equipment: Rolling Walker (2 wheels);Cane - single point   Additional Comments: Wife is in [place] after a fall.      Prior Functioning/Environment Prior Level of Function : Independent/Modified Independent;Driving             Mobility Comments: Uses a SPC at home - recent d/c with [facility] PT scheduled ADLs Comments: States Mod I with ADL, light iADL, continues to drive.  Manages his own medications and does light meal prep.    OT Problem List: Decreased strength;Decreased activity tolerance;Impaired balance (sitting and/or standing)   OT Treatment/Interventions: Self-care/ADL training;Therapeutic activities;Patient/family education;Energy conservation;DME and/or AE instruction;Balance training      OT Goals(Current goals can be found in the care plan section)   Acute Rehab OT Goals Patient Stated Goal: Return home to avoid SNF level rehab OT Goal Formulation: With patient Time For Goal Achievement: 03/27/24 Potential to Achieve Goals: Fair ADL Goals Pt Will Perform Grooming: with modified independence;sitting;standing Pt Will Perform Upper Body Bathing: with modified independence;sitting Pt Will Perform Lower Body Bathing: with modified independence;sit to/from stand Pt Will Perform Upper Body Dressing: with modified independence;sitting Pt Will Perform Lower Body Dressing: with modified independence;sit to/from stand Pt Will Transfer  to Toilet: with modified independence;ambulating;regular height toilet   OT Frequency:  Min 2X/week    Co-evaluation              AM-PAC OT 6 Clicks Daily Activity     Outcome Measure Help from another person eating meals?: None Help from another person taking care of personal grooming?: None Help from another person toileting, which includes using toliet, bedpan, or urinal?: A Little Help from another person bathing (including washing, rinsing, drying)?: A Little Help from another person to put on and taking off regular upper body clothing?: A Little Help from another person to put on and taking off regular lower body clothing?: A Little 6 Click Score: 20   End of Session Equipment Utilized During Treatment: Rolling walker (2 wheels);Oxygen  Nurse Communication: Mobility status  Activity Tolerance: Patient tolerated treatment well Patient left: in bed;with call bell/phone within reach  OT Visit Diagnosis: Unsteadiness on feet (R26.81);Muscle weakness (generalized) (M62.81)                Time: 8893-8873 OT Time Calculation (min): 20 min Charges:  OT General Charges $OT Visit: 1 Visit OT Evaluation $OT Eval Moderate Complexity: 1 Mod  03/13/2024  RP, OTR/L  Acute Rehabilitation Services  Office:  [phone]   Charlie BIRCH Yashvi Jasinski 03/13/2024, 11:40 AM

## 2024-03-13 NOTE — Evaluation (Signed)
 Physical Therapy Evaluation Patient Details Name: Steven Guzman MRN: [id number] DOB: 1941-01-08 Today's Date: 03/13/2024  History of Present Illness  83 yo M adm 12/14 SOB and CP. Pt with workup for COPD exacerbation lung mass and PNA PMH COPD, CHF, CAD MI, aortic aneurysm, ACDF,  chronic neck/back pain, anemia, depression, chronic idiopathic thrombocytopenia,  Clinical Impression  Pt admitted with above diagnosis and presents to PT with functional limitations due to deficits listed below (See PT problem list). Pt needs skilled PT to maximize independence and safety. Pt lives with wife who is currently in short term rehab so he will be at home alone. Pt will need to make good progress in order to return home alone with HHPT. He would like to try for home. If progress is slow will need to look at other post acute options.           If plan is discharge home, recommend the following: A little help with walking and/or transfers;A little help with bathing/dressing/bathroom;Assistance with cooking/housework;Assist for transportation   Can travel by private vehicle        Equipment Recommendations Rolling walker (2 wheels)  Recommendations for Other Services       Functional Status Assessment Patient has had a recent decline in their functional status and demonstrates the ability to make significant improvements in function in a reasonable and predictable amount of time.     Precautions / Restrictions Precautions Precautions: Fall Recall of Precautions/Restrictions: Intact Restrictions Weight Bearing Restrictions Per Provider Order: No      Mobility  Bed Mobility Overal bed mobility: Needs Assistance Bed Mobility: Supine to Sit     Supine to sit: Supervision          Transfers Overall transfer level: Needs assistance Equipment used: Rolling walker (2 wheels) Transfers: Sit to/from Stand Sit to Stand: Contact guard assist           General transfer comment: Assist  for safety    Ambulation/Gait Ambulation/Gait assistance: Contact guard assist, Min assist Gait Distance (Feet): 120 Feet Assistive device: Rolling walker (2 wheels) Gait Pattern/deviations: Step-through pattern, Decreased step length - right, Decreased step length - left Gait velocity: decr Gait velocity interpretation: 1.31 - 2.62 ft/sec, indicative of limited community ambulator   General Gait Details: Assist for safety and more for balance as pt fatigued.  Stairs            Wheelchair Mobility     Tilt Bed    Modified Rankin (Stroke Patients Only)       Balance Overall balance assessment: Needs assistance Sitting-balance support: No upper extremity supported, Feet supported Sitting balance-Leahy Scale: Good     Standing balance support: Reliant on assistive device for balance, Bilateral upper extremity supported, Single extremity supported Standing balance-Leahy Scale: Poor Standing balance comment: Walker and CGA for static standing                             Pertinent Vitals/Pain Pain Assessment Pain Assessment: No/denies pain    Home Living Family/patient expects to be discharged to:: Private residence Living Arrangements: Spouse/significant other Available Help at Discharge: Family;Available PRN/intermittently (wife currently at SNF) Type of Home: Apartment Home Access: Level entry       Home Layout: One level Home Equipment: Cane - single point;Rolling Walker (2 wheels) (Pt reports rolling walker is too wide) Additional Comments: Wife is in [facility] after a fall.    Prior Function  Prior Level of Function : Independent/Modified Independent;Driving             Mobility Comments: Uses a SPC at home - recent d/c with [facility] PT scheduled ADLs Comments: States Mod I with ADL, light iADL, continues to drive.  Manages his own medications and does light meal prep.     Extremity/Trunk Assessment   Upper Extremity Assessment Upper  Extremity Assessment: Defer to OT evaluation    Lower Extremity Assessment Lower Extremity Assessment: Generalized weakness    Cervical / Trunk Assessment Cervical / Trunk Assessment: Kyphotic  Communication   Communication Communication: Impaired Factors Affecting Communication: Hearing impaired    Cognition Arousal: Alert Behavior During Therapy: WFL for tasks assessed/performed   PT - Cognitive impairments: No apparent impairments                         Following commands: Intact       Cueing       General Comments      Exercises     Assessment/Plan    PT Assessment Patient needs continued PT services  PT Problem List Decreased strength;Decreased activity tolerance;Decreased mobility;Decreased balance;Decreased knowledge of use of DME       PT Treatment Interventions DME instruction;Therapeutic activities;Gait training;Functional mobility training;Therapeutic exercise;Balance training;Patient/family education    PT Goals (Current goals can be found in the Care Plan section)  Acute Rehab PT Goals Patient Stated Goal: return home PT Goal Formulation: With patient Time For Goal Achievement: 03/27/24 Potential to Achieve Goals: Good    Frequency Min 2X/week     Co-evaluation               AM-PAC PT 6 Clicks Mobility  Outcome Measure Help needed turning from your back to your side while in a flat bed without using bedrails?: None Help needed moving from lying on your back to sitting on the side of a flat bed without using bedrails?: A Little Help needed moving to and from a bed to a chair (including a wheelchair)?: A Little Help needed standing up from a chair using your arms (e.g., wheelchair or bedside chair)?: A Little Help needed to walk in hospital room?: A Little Help needed climbing 3-5 steps with a railing? : A Lot 6 Click Score: 18    End of Session Equipment Utilized During Treatment: Gait belt;Oxygen  Activity Tolerance:  Patient limited by fatigue Patient left: in bed;with call bell/phone within reach (sitting edge of stretcher eating lunch)   PT Visit Diagnosis: Unsteadiness on feet (R26.81);Other abnormalities of gait and mobility (R26.89);Muscle weakness (generalized) (M62.81)    Time: 8748-8687 PT Time Calculation (min) (ACUTE ONLY): 21 min   Charges:   PT Evaluation $PT Eval Moderate Complexity: 1 Mod   PT General Charges $$ ACUTE PT VISIT: 1 Visit         [facility] PT Acute Rehabilitation Services Office [phone]   Rodgers ORN [facility] 03/13/2024, 3:02 PM

## 2024-03-13 NOTE — Telephone Encounter (Signed)
 Point of Service transmission:  ED to hospital admission, COPD exacerbation  Multiple NSVT, VT-1 events c/w atrial driven tachycardia falling into the VT-1 zone.   Episode 483 and 509 with 8 bursts of ATP.   Patient is in hospital.  Flagging for APP rounding in hospital this week as FYI in case anything further is needed with device/EP follow up.

## 2024-03-14 LAB — CBC
HCT: 33 % — ABNORMAL LOW (ref 39.0–52.0)
Hemoglobin: 10 g/dL — ABNORMAL LOW (ref 13.0–17.0)
MCH: 29.2 pg (ref 26.0–34.0)
MCHC: 30.3 g/dL (ref 30.0–36.0)
MCV: 96.5 fL (ref 80.0–100.0)
Platelets: 183 K/uL (ref 150–400)
RBC: 3.42 MIL/uL — ABNORMAL LOW (ref 4.22–5.81)
RDW: 19.2 % — ABNORMAL HIGH (ref 11.5–15.5)
WBC: 10.9 K/uL — ABNORMAL HIGH (ref 4.0–10.5)
nRBC: 0.3 % — ABNORMAL HIGH (ref 0.0–0.2)

## 2024-03-14 LAB — BASIC METABOLIC PANEL WITH GFR
Anion gap: 9 (ref 5–15)
BUN: 16 mg/dL (ref 8–23)
CO2: 29 mmol/L (ref 22–32)
Calcium: 8.4 mg/dL — ABNORMAL LOW (ref 8.9–10.3)
Chloride: 94 mmol/L — ABNORMAL LOW (ref 98–111)
Creatinine, Ser: 0.63 mg/dL (ref 0.61–1.24)
GFR, Estimated: >60 mL/min (ref >60)
Glucose, Bld: 133 mg/dL — ABNORMAL HIGH (ref 70–99)
Potassium: 4.3 mmol/L (ref 3.5–5.1)
Sodium: 132 mmol/L — ABNORMAL LOW (ref 135–145)

## 2024-03-14 LAB — GLUCOSE, CAPILLARY
Glucose-Capillary: 147 mg/dL — ABNORMAL HIGH (ref 70–99)
Glucose-Capillary: 113 mg/dL — ABNORMAL HIGH (ref 70–99)
Glucose-Capillary: 119 mg/dL — ABNORMAL HIGH (ref 70–99)
Glucose-Capillary: 188 mg/dL — ABNORMAL HIGH (ref 70–99)

## 2024-03-14 LAB — LEGIONELLA PNEUMOPHILA SEROGP 1 UR AG
L. pneumophila Serogp 1 Ur Ag: NEGATIVE
Source of Sample: 41449

## 2024-03-14 LAB — MAGNESIUM: Magnesium: 2.2 mg/dL (ref 1.7–2.4)

## 2024-03-14 MED ORDER — RISAQUAD PO CAPS
1.0000 | ORAL_CAPSULE | Freq: Two times a day (BID) | ORAL | Status: DC
  Administered 2024-03-14 – 2024-03-24 (×21): 1 via ORAL
  Filled 2024-03-14 (×21): qty 1

## 2024-03-14 MED ORDER — SODIUM CHLORIDE 0.9 % IV SOLN
3.0000 g | Freq: Four times a day (QID) | INTRAVENOUS | Status: AC
  Administered 2024-03-14 – 2024-03-18 (×19): 3 g via INTRAVENOUS
  Filled 2024-03-14 (×19): qty 8

## 2024-03-14 MED ORDER — ALBUTEROL SULFATE (2.5 MG/3ML) 0.083% IN NEBU
2.5000 mg | INHALATION_SOLUTION | Freq: Three times a day (TID) | RESPIRATORY_TRACT | Status: DC
  Administered 2024-03-14 – 2024-03-15 (×4): 2.5 mg via RESPIRATORY_TRACT
  Filled 2024-03-14 (×4): qty 3

## 2024-03-14 NOTE — TOC Initial Note (Signed)
 Transition of Care [facility]) - Initial/Assessment Note    Patient Details  Name: Steven Guzman MRN: [id number] Date of Birth: 12-26-1940  Transition of Care [facility]) CM/SW Contact:    Waddell Barnie Rama, RN Phone Number: 03/14/2024, 4:55 PM  Clinical Narrative:                 From home with spouse, has PCP and insurance on file, states has  [facility] services in place with [facility] HHRN, HHPT, HHOT and would like to continue with them, NCM confirmed with Burnard with [facility], has  has rollator, walker, and a cane and home oxygen  with rotech 3 liters at home.  States family member  (nephew or SIL) will transport them home at [employer] and family is support system, states gets medications from [facility] [employer] in [place].  Pta self ambulatory with walker.   Patient states he will check with his insurance company regarding the meal services he can get tomorrow.  Also may want to join the waiting list for meals on wheels.   He states someone here is getting him a walker because the one he has can not get thru the doorways of his home.  He is getting a 21 inch.     Expected Discharge Plan: Home w Home Health Services Barriers to Discharge: Continued Medical Work up   Patient Goals and CMS Choice Patient states their goals for this hospitalization and ongoing recovery are:: return home with wife CMS Medicare.gov Compare Post Acute Care list provided to:: Patient Choice offered to / list presented to : Patient      Expected Discharge Plan and Services In-house Referral: NA Discharge Planning Services: CM Consult Post Acute Care Choice: Home Health Living arrangements for the past 2 months: Single Family Home                 DME Arranged: N/A DME Agency: NA       HH Arranged: RN, PT, OT HH Agency: [facility] Date HH Agency Contacted: 03/14/24 Time HH Agency Contacted: 1654 Representative spoke with at [facility] Agency: Burnard  Prior Living Arrangements/Services Living arrangements for  the past 2 months: Single Family Home Lives with:: Spouse Patient language and need for interpreter reviewed:: Yes Do you feel safe going back to the place where you live?: Yes      Need for Family Participation in Patient Care: Yes (Comment) Care giver support system in place?: Yes (comment) Current home services: DME, Home PT, Home OT, Home RN (active with [facility], has rollator, walker, and a cane and home oxygen  with rotech 3 liters) Criminal Activity/Legal Involvement Pertinent to Current Situation/Hospitalization: No - Comment as needed  Activities of Daily Living   ADL Screening (condition at time of admission) Independently performs ADLs?: Yes (appropriate for developmental age) Is the patient deaf or have difficulty hearing?: No Does the patient have difficulty seeing, even when wearing glasses/contacts?: No Does the patient have difficulty concentrating, remembering, or making decisions?: No  Permission Sought/Granted Permission sought to share information with : Case Manager Permission granted to share information with : Yes, Verbal Permission Granted     Permission granted to share info w AGENCY: HH        Emotional Assessment Appearance:: Appears stated age Attitude/Demeanor/Rapport: Engaged Affect (typically observed): Appropriate Orientation: : Oriented to Self, Oriented to Place, Oriented to  Time, Oriented to Situation Alcohol / Substance Use: Not Applicable Psych Involvement: No (comment)  Admission diagnosis:  Lung mass [R91.8] Pneumonia [J18.9] COPD  exacerbation (HCC) [J44.1] Post-obstructive pneumonia due to foreign body aspiration [J18.9, T17.908S] Patient Active Problem List   Diagnosis Date Noted   Pneumonia 03/12/2024   Prediabetes 03/12/2024   History of abdominal aortic aneurysm (AAA) 03/03/2024   Chronic idiopathic thrombocytopenia (HCC) 03/03/2024   Former smoker 03/03/2024   Bilateral sensorineural hearing loss 03/08/2023   Age-related  vocal fold atrophy 08/03/2022   Inguinal hernia 04/09/2022   AAA (abdominal aortic aneurysm) 04/09/2022   NSTEMI (non-ST elevated myocardial infarction) (HCC) 03/18/2022   Closed displaced fracture of right femoral neck (HCC) 02/06/2022   Major depressive disorder with single episode, in partial remission 03/05/2020   Purpura 08/08/2019   Xerosis cutis 05/25/2019   Paraseptal emphysema (HCC) 12/15/2018   Tobacco abuse 12/15/2018   Left inguinal hernia 11/15/2018   Spondylolisthesis at L5-S1 level 10/14/2018   Senile osteoporosis 10/14/2018   Weight loss 09/29/2018   Lumbar trigger point syndrome 04/20/2018   GERD (gastroesophageal reflux disease)    Esophageal stricture    Degenerative cervical spinal stenosis 10/27/2017   Carotid bruit 10/27/2017   B12 deficiency 08/25/2017   Anemia 06/10/2017   BPH (benign prostatic hyperplasia) 12/31/2016   Mass of throat 06/30/2015   Depression 05/24/2015   AF (atrial fibrillation) (HCC) 03/15/2015   Acute on chronic combined systolic and diastolic CHF (congestive heart failure) (HCC) 11/18/2014   COPD GOLD GRADE C 12/25/2013   CAD (coronary artery disease) 11/23/2012   Smokers' cough (HCC) 11/23/2012   Essential hypertension 11/23/2012   Hyperlipidemia 11/23/2012   Atypical chest pain 11/23/2012   ICD (implantable cardioverter-defibrillator) in place 11/23/2012   PCP:  Esmeralda Morton SAUNDERS, PA-C Pharmacy:   [facility] [id number] - [place],  - [address] [place] [address] Phone: [phone] Fax: [phone]     Social Drivers of Health (SDOH) Social History: SDOH Screenings   Food Insecurity: No Food Insecurity (03/13/2024)  Housing: Low Risk (03/13/2024)  Transportation Needs: No Transportation Needs (03/13/2024)  Utilities: Not At Risk (03/13/2024)  Alcohol Screen: Low Risk (03/06/2021)  Depression (PHQ2-9): Low Risk (03/12/2022)  Financial Resource Strain: Medium Risk (03/06/2021)   Physical Activity: Insufficiently Active (03/06/2021)  Social Connections: Moderately Isolated (03/13/2024)  Stress: No Stress Concern Present (12/27/2021)   Received from [facility] visits prior to 05/30/2022.  Tobacco Use: High Risk (03/12/2024)   SDOH Interventions:     Readmission Risk Interventions    03/14/2024    4:52 PM  Readmission Risk Prevention Plan  Transportation Screening Complete  Medication Review (RN Care Manager) Complete  HRI or Home Care Consult Complete  Palliative Care Screening Not Applicable  Skilled Nursing Facility Not Applicable

## 2024-03-14 NOTE — Progress Notes (Signed)
 Physical Therapy Treatment Patient Details Name: Steven Guzman MRN: [id number] DOB: 05/11/1940 Today's Date: 03/14/2024   History of Present Illness 83 yo M adm 12/14 SOB and CP. Pt with workup for COPD exacerbation lung mass and PNA PMH COPD, CHF, CAD MI, aortic aneurysm, ACDF,  chronic neck/back pain, anemia, depression, chronic idiopathic thrombocytopenia,    PT Comments  Pt able to ambulate with rollator and no physical assistance, but requires seated rest break for oxygen  recovery following short bout of ambulation. Pt reporting SOB, SpO2 83%, after ambulating 40' requiring seated rest break for ~3 mins, bumping oxygen  to 6L before SpO2 stabilizing. Pt titrated back to 2L with SpO2 reading 90% before ambulating back to room. Pt left in room on 2L w/ SpO2 reading 92%. PT will continue to treat pt while he is admitted. Pending progress, recommending HHPT at discharge to address remaninig mobility deficits.    If plan is discharge home, recommend the following: A little help with walking and/or transfers;A little help with bathing/dressing/bathroom;Assistance with cooking/housework;Assist for transportation   Can travel by private Psychologist, Clinical (4 wheels)    Recommendations for Other Services       Precautions / Restrictions Precautions Precautions: Fall Recall of Precautions/Restrictions: Intact Precaution/Restrictions Comments: watch O2 Restrictions Weight Bearing Restrictions Per Provider Order: No     Mobility  Bed Mobility Overal bed mobility: Needs Assistance Bed Mobility: Supine to Sit     Supine to sit: Supervision, HOB elevated     General bed mobility comments: increased time to complete    Transfers Overall transfer level: Needs assistance Equipment used: Rollator (4 wheels) Transfers: Sit to/from Stand Sit to Stand: Contact guard assist           General transfer comment: Pt completed 2 STS from EOB and  rollator without physical assistance. VC given for sequencing and rollator management. Increased time to complete.    Ambulation/Gait Ambulation/Gait assistance: Contact guard assist Gait Distance (Feet): 80 Feet (40 x2 trials with seated rest break in between for fatigue/SOB) Assistive device: Rollator (4 wheels) Gait Pattern/deviations: Step-through pattern, Decreased stride length, Trunk flexed Gait velocity: reduced Gait velocity interpretation: <1.8 ft/sec, indicate of risk for recurrent falls   General Gait Details: Pt demonstrates reciprocal gait pattern with reduced cadence and gait speed. Pt reporting SOB, SpO2 83%, after ambulating 40' requiring seated rest break for ~3 mins, bumping oxygen  to 6L before SpO2 stabilizing. Pt titrated back to 2L with SpO2 reading 90% before ambulating back to room. Pt left in room on 2L w/ SpO2 reading 92%.   Stairs             Wheelchair Mobility     Tilt Bed    Modified Rankin (Stroke Patients Only)       Balance Overall balance assessment: Needs assistance Sitting-balance support: No upper extremity supported, Feet supported Sitting balance-Leahy Scale: Good Sitting balance - Comments: seated EOB   Standing balance support: Bilateral upper extremity supported, During functional activity, Reliant on assistive device for balance Standing balance-Leahy Scale: Poor Standing balance comment: reliant on external support for stability                            Communication Communication Communication: Impaired Factors Affecting Communication: Hearing impaired  Cognition Arousal: Alert Behavior During Therapy: WFL for tasks assessed/performed   PT - Cognitive impairments: No apparent impairments  Following commands: Intact      Cueing Cueing Techniques: Verbal cues  Exercises      General Comments        Pertinent Vitals/Pain Pain Assessment Pain Assessment: No/denies  pain Pain Intervention(s): Monitored during session    Home Living                          Prior Function            PT Goals (current goals can now be found in the care plan section) Acute Rehab PT Goals Patient Stated Goal: return home PT Goal Formulation: With patient Time For Goal Achievement: 03/27/24 Potential to Achieve Goals: Good Progress towards PT goals: Progressing toward goals    Frequency    Min 2X/week      PT Plan      Co-evaluation              AM-PAC PT 6 Clicks Mobility   Outcome Measure  Help needed turning from your back to your side while in a flat bed without using bedrails?: None Help needed moving from lying on your back to sitting on the side of a flat bed without using bedrails?: A Little Help needed moving to and from a bed to a chair (including a wheelchair)?: A Little Help needed standing up from a chair using your arms (e.g., wheelchair or bedside chair)?: A Little Help needed to walk in hospital room?: A Little Help needed climbing 3-5 steps with a railing? : Total 6 Click Score: 17    End of Session Equipment Utilized During Treatment: Gait belt;Oxygen  Activity Tolerance: Patient limited by fatigue Patient left: in bed;with call bell/phone within reach;with nursing/sitter in room (sitting EOB w/ lunch) Nurse Communication: Mobility status PT Visit Diagnosis: Unsteadiness on feet (R26.81);Other abnormalities of gait and mobility (R26.89);Muscle weakness (generalized) (M62.81)     Time: 1137-1209 PT Time Calculation (min) (ACUTE ONLY): 32 min  Charges:    $Therapeutic Activity: 23-37 mins PT General Charges $$ ACUTE PT VISIT: 1 Visit                     Leontine Hilt DPT Acute Rehab Services [phone] Prefer contact via chat    Leontine NOVAK Viraat Vanpatten 03/14/2024, 12:58 PM

## 2024-03-14 NOTE — Progress Notes (Signed)
 PROGRESS NOTE    Steven Guzman  FMW:[id number] DOB: 11-11-1940 DOA: 03/12/2024 PCP: Esmeralda Morton SAUNDERS, PA-C    Brief Narrative:  DOVER HEAD is a 83 yr old man with HTN, COPD, chronic hypoxic respiratory failure, CAD, HFrEF, and VT with ICD presenting with pleuritic chest pain and SOB.  Patient reports a recent hospitalization, posthospitalization remain really weak, developed left-sided chest pain with shortness of breath overnight.  CTA is negative for PE,7.3 x 6.6 cm rounded ill-defined masslike consolidative opacity - pneumonia (vs neoplasm). 4.5 cm diameter ascending thoracic aortic aneurysm.  Slowly improving.   Assessment and Plan: * Pneumonia - Complaining of shortness of breath, dry cough Satting 100% on 3 L of oxygen -wean to room air as able (was discharged home 1 week ago on nasal cannula but was not on prior) - Continue antibiotics - incentive spirometer and flutter valve -obtain sputum cultures - Continue steroids for now  Hyponatremia - Trending up  Acute on chronic combined systolic and diastolic CHF (congestive heart failure) (HCC) - Stable - Will resume judiciously due to hypotension-once confirmed home meds   Atypical chest pain Brief left-sided chest pain-resolved  Chronic idiopathic thrombocytopenia (HCC) Currently stable monitoring  Bilateral sensorineural hearing loss Noted  Degenerative cervical spinal stenosis - No current complaint, denies any numbness or tingling, acute on chronic generalized weaknesses -Will consult PT/OT for evaluation  B12 deficiency Resume supplements   Anemia Anemia of chronic disease with iron deficiency anemia -Continue ferrous sulfate , H&H stable  BPH (benign prostatic hyperplasia) Monitoring for urinary retention, currently no complaint, continue Flomax   AF (atrial fibrillation) (HCC) Stable -will continue Coreg , mexiletine, amiodarone , and Eliquis   - Defibrillator in place  COPD GOLD GRADE C -  Chronic condition,-compliant with oxygen  at baseline 3 L, satting 100% -Mild shortness of breath with no wheezing, initiated antibiotics, short burst of steroids - Encouraging incentive spirometer, flutter valve, mucolytics  ICD (implantable cardioverter-defibrillator) in place Assessed in place, no recent history of discharge -Patient reports no recent changes.  Stable  Hyperlipidemia - On Repatha   Essential hypertension -hypotensive, holding home heart medications - Currently listed Meds spironolactone , Lasix , Coreg , amiodarone  .. Monitoring BP closely, restarting above meds judiciously   CAD (coronary artery disease) Stable, reviewing home medication resuming according Denies any chest pain  History of abdominal aortic aneurysm (AAA) Noted-stable  Major depressive disorder with single episode, in partial remission Currently stable, continue home medication of Lexapro   GERD (gastroesophageal reflux disease) Continue PPI  Prediabetes Last A1c 5 months ago 5.8 -Due to steroids, anticipating hyperglycemia - SSI coverage     DVT prophylaxis: SCDs Start: 03/12/24 0808 apixaban  (ELIQUIS ) tablet 5 mg    Code Status: Full Code   Disposition Plan:  Level of care: Telemetry Status is: Inpatient     Consultants:     Subjective: Wife is currently in the hospital at [facility], she has been in a skilled nursing facility after an operation  Objective: Vitals:   03/14/24 0737 03/14/24 0822 03/14/24 0827 03/14/24 0831  BP: 99/87     Pulse: 95     Resp: 18     Temp: 97.6 F (36.4 C)     TempSrc: Oral     SpO2: 91% 93% 95% 98%  Weight:      Height:        Intake/Output Summary (Last 24 hours) at 03/14/2024 1148 Last data filed at 03/14/2024 1014 Gross per 24 hour  Intake 1143 ml  Output 800 ml  Net 343 ml  Filed Weights   03/12/24 0102 03/14/24 0426  Weight: 65.7 kg 64.5 kg    Examination:   General: Appearance:    Frail appearing male in no acute  distress     Lungs:   On nasal cannula respirations unlabored- no wheezing  Heart:    Normal heart rate.   MS:   All extremities are intact.    Neurologic:   Awake, alert       Data Reviewed: I have personally reviewed following labs and imaging studies  CBC: Recent Labs  Lab 03/12/24 0110 03/13/24 0326 03/14/24 0216  WBC 16.7* 16.3* 10.9*  HGB 11.3* 9.8* 10.0*  HCT 37.5* 32.3* 33.0*  MCV 97.9 97.6 96.5  PLT 221 162 183   Basic Metabolic Panel: Recent Labs  Lab 03/12/24 0110 03/12/24 0811 03/13/24 0326 03/14/24 0216 03/14/24 1036  NA 131*  --  128* 132*  --   K 4.9  --  4.1 4.3  --   CL 89*  --  91* 94*  --   CO2 30  --  27 29  --   GLUCOSE 150*  --  103* 133*  --   BUN 20  --  12 16  --   CREATININE 0.87  --  0.57* 0.63  --   CALCIUM  9.0  --  8.3* 8.4*  --   MG  --  1.8  --   --  2.2  PHOS  --  2.9  --   --   --    GFR: Estimated Creatinine Clearance: 61.6 mL/min (by C-G formula based on SCr of 0.63 mg/dL). Liver Function Tests: No results for input(s): AST, ALT, ALKPHOS, BILITOT, PROT, ALBUMIN in the last 168 hours. No results for input(s): LIPASE, AMYLASE in the last 168 hours. No results for input(s): AMMONIA in the last 168 hours. Coagulation Profile: Recent Labs  Lab 03/13/24 0326  INR 1.7*   Cardiac Enzymes: No results for input(s): CKTOTAL, CKMB, CKMBINDEX, TROPONINI in the last 168 hours. BNP (last 3 results) No results for input(s): PROBNP in the last 8760 hours. HbA1C: Recent Labs    03/12/24 1002  HGBA1C 5.2   CBG: Recent Labs  Lab 03/13/24 1215 03/13/24 1736 03/13/24 2114 03/14/24 0610 03/14/24 1105  GLUCAP 137* 126* 122* 147* 113*   Lipid Profile: No results for input(s): CHOL, HDL, LDLCALC, TRIG, CHOLHDL, LDLDIRECT in the last 72 hours. Thyroid  Function Tests: No results for input(s): TSH, T4TOTAL, FREET4, T3FREE, THYROIDAB in the last 72 hours. Anemia Panel: No results  for input(s): VITAMINB12, FOLATE, FERRITIN, TIBC, IRON, RETICCTPCT in the last 72 hours. Sepsis Labs: Recent Labs  Lab 03/12/24 0811 03/13/24 0326  PROCALCITON <0.10  --   LATICACIDVEN  --  0.9    Recent Results (from the past 240 hours)  Culture, blood (routine x 2) Call MD if unable to obtain prior to antibiotics being given     Status: None (Preliminary result)   Collection Time: 03/12/24  8:11 AM   Specimen: BLOOD  Result Value Ref Range Status   Specimen Description BLOOD RIGHT ANTECUBITAL  Final   Special Requests   Final    BOTTLES DRAWN AEROBIC AND ANAEROBIC Blood Culture results may not be optimal due to an inadequate volume of blood received in culture bottles   Culture   Final    NO GROWTH < 24 HOURS Performed at [facility], [address] [place], [place] [address]    Report Status PENDING  Incomplete  Culture, blood (routine x 2) Call MD if unable to obtain prior to antibiotics being given     Status: None (Preliminary result)   Collection Time: 03/12/24 10:59 AM   Specimen: BLOOD RIGHT ARM  Result Value Ref Range Status   Specimen Description BLOOD RIGHT ARM  Final   Special Requests   Final    BOTTLES DRAWN AEROBIC AND ANAEROBIC Blood Culture adequate volume   Culture   Final    NO GROWTH < 24 HOURS Performed at [facility], [address], [place], [place] [address]    Report Status PENDING  Incomplete         Radiology Studies: No results found.       Scheduled Meds:  acidophilus  1 capsule Oral BID   albuterol   2.5 mg Nebulization TID   amiodarone   200 mg Oral Daily   apixaban   5 mg Oral BID   arformoterol   15 mcg Nebulization BID   And   umeclidinium bromide   1 puff Inhalation Daily   aspirin  EC  81 mg Oral Daily   carvedilol   3.125 mg Oral BID   doxycycline   100 mg Oral Q12H   escitalopram   20 mg Oral Daily   ferrous sulfate    Oral Daily   guaiFENesin   1,200 mg Oral Q8H   insulin  aspart  0-9 Units  Subcutaneous TID WC   lidocaine   1 patch Transdermal Q24H   magnesium  oxide  400 mg Oral Daily   melatonin  3 mg Oral QHS   methylPREDNISolone  (SOLU-MEDROL ) injection  40 mg Intravenous Q24H   mexiletine  200 mg Oral BID   pantoprazole   40 mg Oral Daily   pregabalin   25 mg Oral BID   sodium chloride  flush  3 mL Intravenous Q12H   sodium chloride  flush  3 mL Intravenous Q12H   tamsulosin   0.4 mg Oral Daily   Continuous Infusions:  ampicillin -sulbactam (UNASYN ) IV 3 g (03/14/24 0610)     LOS: 2 days    Time spent: 45 minutes spent on chart review, discussion with nursing staff, consultants, updating family and interview/physical exam; more than 50% of that time was spent in counseling and/or coordination of care.    Harlene RAYMOND Bowl, DO [facility] Available via Epic secure chat 7am-7pm After these hours, please refer to coverage provider listed on [email or web address] 03/14/2024, 11:48 AM

## 2024-03-14 NOTE — Plan of Care (Signed)
  Problem: Coping: Goal: Ability to adjust to condition or change in health will improve Outcome: Progressing   Problem: Metabolic: Goal: Ability to maintain appropriate glucose levels will improve Outcome: Progressing   Problem: Nutritional: Goal: Maintenance of adequate nutrition will improve Outcome: Progressing   Problem: Clinical Measurements: Goal: Ability to maintain clinical measurements within normal limits will improve Outcome: Progressing

## 2024-03-14 NOTE — Progress Notes (Signed)
 Mobility Specialist Progress Note:    03/14/24 1030  Mobility  Activity Ambulated with assistance  Level of Assistance Contact guard assist, steadying assist  Assistive Device Front wheel walker  Distance Ambulated (ft) 50 ft  Range of Motion/Exercises Active  Activity Response Tolerated well  Mobility Referral Yes  Mobility visit 1 Mobility  Mobility Specialist Start Time (ACUTE ONLY) 1030  Mobility Specialist Stop Time (ACUTE ONLY) 1101  Mobility Specialist Time Calculation (min) (ACUTE ONLY) 31 min   Received pt laying in bed agreeable to session. Pt c/o chest pain when breathing and feeling weak. Pt able to transfer, stand, and ambulate CGA. Pt needing one seated break. Returned pt to bed w/ all needs met.   Venetia Keel Mobility Specialist Please Neurosurgeon or Rehab Office at [phone]

## 2024-03-15 ENCOUNTER — Inpatient Hospital Stay (HOSPITAL_COMMUNITY)

## 2024-03-15 DIAGNOSIS — J189 Pneumonia, unspecified organism: Secondary | ICD-10-CM | POA: Diagnosis not present

## 2024-03-15 DIAGNOSIS — T17908S Unspecified foreign body in respiratory tract, part unspecified causing other injury, sequela: Secondary | ICD-10-CM | POA: Diagnosis not present

## 2024-03-15 DIAGNOSIS — J441 Chronic obstructive pulmonary disease with (acute) exacerbation: Secondary | ICD-10-CM | POA: Diagnosis not present

## 2024-03-15 LAB — BASIC METABOLIC PANEL WITH GFR
Anion gap: 7 (ref 5–15)
BUN: 13 mg/dL (ref 8–23)
CO2: 30 mmol/L (ref 22–32)
Calcium: 8.3 mg/dL — ABNORMAL LOW (ref 8.9–10.3)
Chloride: 96 mmol/L — ABNORMAL LOW (ref 98–111)
Creatinine, Ser: 0.45 mg/dL — ABNORMAL LOW (ref 0.61–1.24)
GFR, Estimated: >60 mL/min (ref >60)
Glucose, Bld: 111 mg/dL — ABNORMAL HIGH (ref 70–99)
Potassium: 4.1 mmol/L (ref 3.5–5.1)
Sodium: 133 mmol/L — ABNORMAL LOW (ref 135–145)

## 2024-03-15 LAB — CBC
HCT: 32.5 % — ABNORMAL LOW (ref 39.0–52.0)
Hemoglobin: 9.9 g/dL — ABNORMAL LOW (ref 13.0–17.0)
MCH: 29.1 pg (ref 26.0–34.0)
MCHC: 30.5 g/dL (ref 30.0–36.0)
MCV: 95.6 fL (ref 80.0–100.0)
Platelets: 206 K/uL (ref 150–400)
RBC: 3.4 MIL/uL — ABNORMAL LOW (ref 4.22–5.81)
RDW: 19.2 % — ABNORMAL HIGH (ref 11.5–15.5)
WBC: 8.3 K/uL (ref 4.0–10.5)
nRBC: 0.4 % — ABNORMAL HIGH (ref 0.0–0.2)

## 2024-03-15 LAB — GLUCOSE, CAPILLARY
Glucose-Capillary: 137 mg/dL — ABNORMAL HIGH (ref 70–99)
Glucose-Capillary: 140 mg/dL — ABNORMAL HIGH (ref 70–99)
Glucose-Capillary: 132 mg/dL — ABNORMAL HIGH (ref 70–99)
Glucose-Capillary: 210 mg/dL — ABNORMAL HIGH (ref 70–99)
Glucose-Capillary: 141 mg/dL — ABNORMAL HIGH (ref 70–99)

## 2024-03-15 LAB — EXPECTORATED SPUTUM ASSESSMENT W GRAM STAIN, RFLX TO RESP C

## 2024-03-15 MED ORDER — NYSTATIN 100000 UNIT/ML MT SUSP
5.0000 mL | Freq: Four times a day (QID) | OROMUCOSAL | Status: DC
  Administered 2024-03-15 – 2024-03-24 (×34): 500000 [IU] via ORAL
  Filled 2024-03-15 (×34): qty 5

## 2024-03-15 MED ORDER — SENNOSIDES-DOCUSATE SODIUM 8.6-50 MG PO TABS
1.0000 | ORAL_TABLET | Freq: Two times a day (BID) | ORAL | Status: DC
  Administered 2024-03-15 – 2024-03-24 (×17): 1 via ORAL
  Filled 2024-03-15 (×18): qty 1

## 2024-03-15 MED ORDER — PANTOPRAZOLE SODIUM 40 MG PO TBEC
40.0000 mg | DELAYED_RELEASE_TABLET | Freq: Two times a day (BID) | ORAL | Status: DC
  Administered 2024-03-15 – 2024-03-24 (×18): 40 mg via ORAL
  Filled 2024-03-15 (×18): qty 1

## 2024-03-15 MED ORDER — ENSURE PLUS HIGH PROTEIN PO LIQD
237.0000 mL | Freq: Two times a day (BID) | ORAL | Status: DC
  Administered 2024-03-16 – 2024-03-24 (×16): 237 mL via ORAL

## 2024-03-15 MED ORDER — EMPAGLIFLOZIN 10 MG PO TABS
10.0000 mg | ORAL_TABLET | Freq: Every day | ORAL | Status: DC
  Administered 2024-03-15 – 2024-03-24 (×10): 10 mg via ORAL
  Filled 2024-03-15 (×10): qty 1

## 2024-03-15 MED ORDER — FUROSEMIDE 40 MG PO TABS
40.0000 mg | ORAL_TABLET | Freq: Every day | ORAL | Status: DC
  Administered 2024-03-15 – 2024-03-24 (×10): 40 mg via ORAL
  Filled 2024-03-15 (×10): qty 1

## 2024-03-15 NOTE — Progress Notes (Signed)
 Modified Barium Swallow Study  Patient Details  Name: Steven Guzman MRN: [id number] Date of Birth: 06/04/1940  Today's Date: 03/15/2024  Modified Barium Swallow completed.  Full report located under Chart Review in the Imaging Section.  History of Present Illness 83 yo male presenting to ED 12/14 with pleuritic chest pain and SOB. Admitted with COPD exacerbation and pneumonia. CTA shows rounded ill-defined mass-like consolidative opacity, compatible with pneumonia with additional ill-defined nodular opacities identified in the RLL with evidence of peripheral small airway impaction and a moderate hiatal hernia. Seen by SLP 03/04/24 with functional appearing swallowing and for dysphonia 08/03/22. Stroboscopy 08/19/22 shows mild diffuse erythema, minimal posterior laryngeal edema, preserved vocal fold mobility with midfold atrophy, resulting in incomplete glottic closure. ENT confirms the presence of tonsilloliths, which are bothersome to the pt but that he does not wish to remove. PMH: COPD, CHF, CAD, GERD, MI, aortic aneurysm, ACDF, chronic neck/back pain, anemia, depression, idiopathic thrombocytopenia   Clinical Impression Pt exhibits moderate pharyngeal dysphagia characterized by mistiming, which may be a result of effort required to coordinate breathing and swallowing. Aspiration occurs when taking large, successive straw sips of thin liquids and while it is sensed, his cough cannot expel the bolus completely (PAS 7). This appears to happen as the bolus reaches the pyriform sinuses before the swallow is initiated and spills over into the larynx before he achieves complete closure. His second line of defense is ineffective secondary to known vocal fold atrophy with incomplete glottic closure. No aspiration occurs with cup sips of thin liquids and trace penetrates are ejected with a cued cough (PAS 4). While aspiration was also prevented with use of thickened liquids, suspect increased WOB has the  potential to affect timing and coordination throughout the course of a meal. If thickened liquids are aspirated, they are thought to cause more harm to his pulmonary health. Discussed factors that are within pt's control to minimize the risk of adverse events in the presence of aspiration, including oral care BID in addition to before PO intake and mobility. Pt wishes to continue regular diet with thin liquids via cup only and meds whole in puree. Will f/u for ongoing education. Recommend he continue dysphagia interventions with HH or OP SLP.  Factors that may increase risk of adverse event in presence of aspiration Noe & Lianne 2021): Respiratory or GI disease;Inadequate oral hygiene  Swallow Evaluation Recommendations Recommendations: PO diet PO Diet Recommendation: Regular;Thin liquids (Level 0) Liquid Administration via: Cup;No straw Medication Administration: Whole meds with puree Supervision: Patient able to self-feed;Intermittent supervision/cueing for swallowing strategies Swallowing strategies  : Slow rate;Small bites/sips;Hard cough after swallowing Postural changes: Position pt fully upright for meals;Stay upright 30-60 min after meals Oral care recommendations: Oral care BID (2x/day);Oral care before PO    [name], M.A., CCC-SLP Speech Language Pathology, Acute Rehabilitation Services  Secure Chat preferred [phone]  03/15/2024,2:02 PM

## 2024-03-15 NOTE — Progress Notes (Signed)
 Mobility Specialist Progress Note:    03/15/24 1510  Mobility  Activity Ambulated with assistance  Level of Assistance Contact guard assist, steadying assist  Assistive Device Front wheel walker  Distance Ambulated (ft) 50 ft  Range of Motion/Exercises Active  Activity Response Tolerated well  Mobility Referral Yes  Mobility visit 1 Mobility  Mobility Specialist Start Time (ACUTE ONLY) 1510  Mobility Specialist Stop Time (ACUTE ONLY) 1522  Mobility Specialist Time Calculation (min) (ACUTE ONLY) 12 min   Received pt sitting EOB agreeable to session. No c/o any symptoms. Pt tired but otherwise moving and ambulating well after procedure. Returned pt to room w/ all needs met.   Venetia Keel Mobility Specialist Please Neurosurgeon or Rehab Office at [phone]

## 2024-03-15 NOTE — Plan of Care (Signed)
  Problem: Coping: Goal: Ability to adjust to condition or change in health will improve Outcome: Progressing   Problem: Health Behavior/Discharge Planning: Goal: Ability to manage health-related needs will improve Outcome: Progressing   Problem: Nutritional: Goal: Maintenance of adequate nutrition will improve Outcome: Progressing   Problem: Skin Integrity: Goal: Risk for impaired skin integrity will decrease Outcome: Progressing   Problem: Education: Goal: Knowledge of General Education information will improve Description: Including pain rating scale, medication(s)/side effects and non-pharmacologic comfort measures Outcome: Progressing

## 2024-03-15 NOTE — Progress Notes (Addendum)
 PROGRESS NOTE    [name]  FMW:[id number] DOB: 05-25-1940 DOA: 03/12/2024 PCP: Esmeralda Morton SAUNDERS, PA-C    Brief Narrative:  Steven Guzman is a 83 yr old man with HTN, COPD, chronic hypoxic respiratory failure, CAD, HFrEF, and VT with ICD presenting with pleuritic chest pain and SOB.  Patient reports a recent hospitalization, posthospitalization remain really weak, developed left-sided chest pain with shortness of breath overnight.  CTA is negative for PE,7.3 x 6.6 cm rounded ill-defined masslike consolidative opacity - pneumonia (vs neoplasm). 4.5 cm diameter ascending thoracic aortic aneurysm.  Slowly improving.   Assessment and Plan: * Pneumonia Satting 100% on 3 L of oxygen -wean to room air as able (was discharged home 1 week ago on nasal cannula but was not on prior) - Continue antibiotics - incentive spirometer and flutter valve -obtain sputum cultures - Continue steroids for now -SLP eval -will need follow up xray/scan to ensure resolution  Hyponatremia - Trending up  Acute on chronic combined systolic and diastolic CHF (congestive heart failure) (HCC) - Stable - Resume home meds  Atypical chest pain Brief left-sided chest pain-resolved  Chronic idiopathic thrombocytopenia (HCC) Currently stable monitoring  Bilateral sensorineural hearing loss Noted  Degenerative cervical spinal stenosis - No current complaint, denies any numbness or tingling, acute on chronic generalized weaknesses -Will consult PT/OT for evaluation-Home health  B12 deficiency Resume supplements   Anemia Anemia of chronic disease with iron deficiency anemia -Continue ferrous sulfate , H&H stable  BPH (benign prostatic hyperplasia) Monitoring for urinary retention, currently no complaint, continue Flomax   AF (atrial fibrillation) (HCC) Stable -will continue Coreg , mexiletine, amiodarone , and Eliquis   - Defibrillator in place  COPD GOLD GRADE C - Chronic condition,-compliant  with oxygen  at baseline 3 L, satting 100% -Mild shortness of breath with no wheezing, initiated antibiotics, short burst of steroids - Encouraging incentive spirometer, flutter valve, mucolytics  ICD (implantable cardioverter-defibrillator) in place Assessed in place, no recent history of discharge -Patient reports no recent changes.  Stable  Hyperlipidemia - On Repatha   Essential hypertension -hypotensive, holding home heart medications - Currently listed Meds spironolactone , Lasix , Coreg , amiodarone  .. Monitoring BP closely, restarting above meds judiciously   CAD (coronary artery disease) Stable, reviewing home medication resuming according Denies any chest pain  History of abdominal aortic aneurysm (AAA) Noted-stable  Major depressive disorder with single episode, in partial remission Currently stable, continue home medication of Lexapro   GERD (gastroesophageal reflux disease) Continue PPI  Prediabetes Last A1c 5 months ago 5.8 -Due to steroids, anticipating hyperglycemia - SSI coverage     DVT prophylaxis: SCDs Start: 03/12/24 0808 apixaban  (ELIQUIS ) tablet 5 mg    Code Status: Full Code   Disposition Plan:  Level of care: Telemetry Status is: Inpatient     Consultants:     Subjective: + mucous production and cough  Objective: Vitals:   03/14/24 2206 03/14/24 2355 03/15/24 0439 03/15/24 0831  BP: 123/71 (!) 112/58 135/83 (!) 123/97  Pulse: 91 79 74 88  Resp:  20 20 16   Temp:  98.5 F (36.9 C) 98.1 F (36.7 C) 98.4 F (36.9 C)  TempSrc:  Oral Oral Oral  SpO2:  95% 93% 95%  Weight:   65 kg   Height:        Intake/Output Summary (Last 24 hours) at 03/15/2024 1024 Last data filed at 03/15/2024 1006 Gross per 24 hour  Intake 687.81 ml  Output 1800 ml  Net -1112.19 ml   Filed Weights   03/12/24 0102 03/14/24  9573 03/15/24 0439  Weight: 65.7 kg 64.5 kg 65 kg    Examination:   General: Appearance:    Frail appearing male in no acute  distress     Lungs:   On nasal cannula respirations unlabored/diminished- no wheezing  Heart:    Normal heart rate.   MS:   All extremities are intact.    Neurologic:   Awake, alert       Data Reviewed: I have personally reviewed following labs and imaging studies  CBC: Recent Labs  Lab 03/12/24 0110 03/13/24 0326 03/14/24 0216 03/15/24 0239  WBC 16.7* 16.3* 10.9* 8.3  HGB 11.3* 9.8* 10.0* 9.9*  HCT 37.5* 32.3* 33.0* 32.5*  MCV 97.9 97.6 96.5 95.6  PLT 221 162 183 206   Basic Metabolic Panel: Recent Labs  Lab 03/12/24 0110 03/12/24 0811 03/13/24 0326 03/14/24 0216 03/14/24 1036 03/15/24 0239  NA 131*  --  128* 132*  --  133*  K 4.9  --  4.1 4.3  --  4.1  CL 89*  --  91* 94*  --  96*  CO2 30  --  27 29  --  30  GLUCOSE 150*  --  103* 133*  --  111*  BUN 20  --  12 16  --  13  CREATININE 0.87  --  0.57* 0.63  --  0.45*  CALCIUM  9.0  --  8.3* 8.4*  --  8.3*  MG  --  1.8  --   --  2.2  --   PHOS  --  2.9  --   --   --   --    GFR: Estimated Creatinine Clearance: 62.1 mL/min (A) (by C-G formula based on SCr of 0.45 mg/dL (L)). Liver Function Tests: No results for input(s): AST, ALT, ALKPHOS, BILITOT, PROT, ALBUMIN in the last 168 hours. No results for input(s): LIPASE, AMYLASE in the last 168 hours. No results for input(s): AMMONIA in the last 168 hours. Coagulation Profile: Recent Labs  Lab 03/13/24 0326  INR 1.7*   Cardiac Enzymes: No results for input(s): CKTOTAL, CKMB, CKMBINDEX, TROPONINI in the last 168 hours. BNP (last 3 results) No results for input(s): PROBNP in the last 8760 hours. HbA1C: No results for input(s): HGBA1C in the last 72 hours.  CBG: Recent Labs  Lab 03/14/24 0610 03/14/24 1105 03/14/24 1608 03/14/24 2120 03/15/24 0608  GLUCAP 147* 113* 119* 188* 137*   Lipid Profile: No results for input(s): CHOL, HDL, LDLCALC, TRIG, CHOLHDL, LDLDIRECT in the last 72 hours. Thyroid  Function  Tests: No results for input(s): TSH, T4TOTAL, FREET4, T3FREE, THYROIDAB in the last 72 hours. Anemia Panel: No results for input(s): VITAMINB12, FOLATE, FERRITIN, TIBC, IRON, RETICCTPCT in the last 72 hours. Sepsis Labs: Recent Labs  Lab 03/12/24 0811 03/13/24 0326  PROCALCITON <0.10  --   LATICACIDVEN  --  0.9    Recent Results (from the past 240 hours)  Culture, blood (routine x 2) Call MD if unable to obtain prior to antibiotics being given     Status: None (Preliminary result)   Collection Time: 03/12/24  8:11 AM   Specimen: BLOOD  Result Value Ref Range Status   Specimen Description BLOOD RIGHT ANTECUBITAL  Final   Special Requests   Final    BOTTLES DRAWN AEROBIC AND ANAEROBIC Blood Culture results may not be optimal due to an inadequate volume of blood received in culture bottles   Culture   Final    NO GROWTH 3 DAYS Performed  at [facility], [address], [place], [place] [address]    Report Status PENDING  Incomplete  Culture, blood (routine x 2) Call MD if unable to obtain prior to antibiotics being given     Status: None (Preliminary result)   Collection Time: 03/12/24 10:59 AM   Specimen: BLOOD RIGHT ARM  Result Value Ref Range Status   Specimen Description BLOOD RIGHT ARM  Final   Special Requests   Final    BOTTLES DRAWN AEROBIC AND ANAEROBIC Blood Culture adequate volume   Culture   Final    NO GROWTH 3 DAYS Performed at [facility], [address], [place], [place] [address]    Report Status PENDING  Incomplete  Expectorated Sputum Assessment w Gram Stain, Rflx to Resp Cult     Status: None   Collection Time: 03/14/24  8:12 AM   Specimen: Expectorated Sputum  Result Value Ref Range Status   Specimen Description EXPECTORATED SPUTUM  Final   Special Requests NONE  Final   Sputum evaluation   Final    THIS SPECIMEN IS ACCEPTABLE FOR SPUTUM CULTURE Performed at [facility], [address] [place], [place]  [address]    Report Status 03/15/2024 FINAL  Final  Culture, Respiratory w Gram Stain     Status: None (Preliminary result)   Collection Time: 03/14/24  8:12 AM  Result Value Ref Range Status   Specimen Description EXPECTORATED SPUTUM  Final   Special Requests NONE Reflexed from [id number]  Final   Gram Stain   Final    FEW WBC PRESENT, PREDOMINANTLY PMN RARE GRAM NEGATIVE RODS RARE GRAM POSITIVE RODS Performed at [facility], [address] [place], [place] [address]    Culture PENDING  Incomplete   Report Status PENDING  Incomplete         Radiology Studies: No results found.       Scheduled Meds:  acidophilus  1 capsule Oral BID   amiodarone   200 mg Oral Daily   apixaban   5 mg Oral BID   arformoterol   15 mcg Nebulization BID   And   umeclidinium bromide   1 puff Inhalation Daily   aspirin  EC  81 mg Oral Daily   carvedilol   3.125 mg Oral BID   doxycycline   100 mg Oral Q12H   empagliflozin   10 mg Oral Daily   escitalopram   20 mg Oral Daily   ferrous sulfate    Oral Daily   furosemide   40 mg Oral Daily   guaiFENesin   1,200 mg Oral Q8H   insulin  aspart  0-9 Units Subcutaneous TID WC   lidocaine   1 patch Transdermal Q24H   magnesium  oxide  400 mg Oral Daily   melatonin  3 mg Oral QHS   methylPREDNISolone  (SOLU-MEDROL ) injection  40 mg Intravenous Q24H   mexiletine  200 mg Oral BID   nystatin   5 mL Oral QID   pantoprazole   40 mg Oral Daily   pregabalin   25 mg Oral BID   sodium chloride  flush  3 mL Intravenous Q12H   sodium chloride  flush  3 mL Intravenous Q12H   tamsulosin   0.4 mg Oral Daily   Continuous Infusions:  ampicillin -sulbactam (UNASYN ) IV 3 g (03/15/24 0618)     LOS: 3 days    Time spent: 45 minutes spent on chart review, discussion with nursing staff, consultants, updating family and interview/physical exam; more than 50% of that time was spent in counseling and/or coordination of  care.    Harlene RAYMOND Bowl, DO [facility] Available via  Epic secure chat 7am-7pm After these hours, please refer to coverage provider listed on [email or web address] 03/15/2024, 10:24 AM

## 2024-03-15 NOTE — Evaluation (Signed)
 Clinical/Bedside Swallow Evaluation Patient Details  Name: Steven Guzman MRN: [id number] Date of Birth: 1940/06/28  Today's Date: 03/15/2024 Time: SLP Start Time (ACUTE ONLY): 1009 SLP Stop Time (ACUTE ONLY): 1031 SLP Time Calculation (min) (ACUTE ONLY): 22 min  Past Medical History:  Past Medical History:  Diagnosis Date   AICD (automatic cardioverter/defibrillator) present    [facility] [employer] MOMENTUM EL ICD D121/ [phone]   Aneurysm    a. Aneurysmal infrarenal aorta up to 33 mm on CT 10/2014, recommended f/u due 10/2017   Anginal pain    Anxiety    Arthritis    right foot   Basal cell carcinoma of nose    S/P MOHS   Biliary acute pancreatitis    CAD (coronary artery disease)    a. s/p MI in 1994 with PCI to LAD at that time b. cath 10/2012 demonstrated EF 30%, inferior akinesis with mild hypokinesis of all walls, patent LAD and RCA stents; ostial PDA with 80-90% obstruction with medical therapy recommended    Chronic systolic CHF (congestive heart failure) (HCC)    EF 30 to 35 % as of 09/2014.    CKD (chronic kidney disease), stage III (HCC)    Complication of anesthesia 10/2014   had to have defibrillator w/ERCP- CODED after having gallstones removed   COPD (chronic obstructive pulmonary disease) (HCC)    a. followed by pulmonary, COPD GOLD stage II   Depression    Diverticulosis of colon 07/2014   noted on CT   Dyspnea    with exertion   GERD (gastroesophageal reflux disease)    Hiatal hernia    large   History of kidney stones    passed stone   Hyperglycemia 10/2012   Hyperlipidemia    Hypertension    Myocardial infarction [facility]) 1994; 2011   Pneumonia 1946; 2015   Prostate enlargement 07/2014   observed on CT   Tobacco abuse    Ventricular tachycardia (HCC)    a. 08/2009 s/p BSX E110 Teligen 100 AICD, ser#: [id number];  b. 08/2008 VT req ATP - detection reprogrammed from 160 to 150. c. EPS and VT ablation by Dr. Waddell 12/21/2014   Past Surgical History:  Past  Surgical History:  Procedure Laterality Date   BIOPSY  12/21/2017   Procedure: BIOPSY;  Surgeon: Abran Norleen SAILOR, MD;  Location: [facility] ENDOSCOPY;  Service: Endoscopy;;   CATARACT EXTRACTION W/ INTRAOCULAR LENS  IMPLANT, BILATERAL Bilateral ~ 2011   COLONOSCOPY     COLONOSCOPY WITH PROPOFOL  N/A 12/21/2017   Procedure: COLONOSCOPY WITH PROPOFOL ;  Surgeon: Abran Norleen SAILOR, MD;  Location: [facility] ENDOSCOPY;  Service: Endoscopy;  Laterality: N/A;   ELECTROPHYSIOLOGIC STUDY N/A 12/21/2014   Procedure: V Tach Ablation;  Surgeon: Danelle LELON Waddell, MD;  Location: [facility] INVASIVE CV LAB;  Service: Cardiovascular;  Laterality: N/A;   ERCP N/A 11/16/2014   Procedure: ENDOSCOPIC RETROGRADE CHOLANGIOPANCREATOGRAPHY (ERCP);  Surgeon: Lamar JONETTA Aho, MD;  Location: [facility] ENDOSCOPY;  Service: Endoscopy;  Laterality: N/A;   ESOPHAGOGASTRODUODENOSCOPY (EGD) WITH PROPOFOL  N/A 12/21/2017   Procedure: ESOPHAGOGASTRODUODENOSCOPY (EGD) WITH PROPOFOL ;  Surgeon: Abran Norleen SAILOR, MD;  Location: [facility] ENDOSCOPY;  Service: Endoscopy;  Laterality: N/A;   EYE SURGERY     FOOT SURGERY Left 2005   fixed bone that stuck out in my ankle area   HEMORRHOID BANDING     ICD GENERATOR CHANGEOUT N/A 04/20/2022   Procedure: ICD GENERATOR CHANGEOUT;  Surgeon: Waddell Danelle LELON, MD;  Location: [facility] INVASIVE CV LAB;  Service: Cardiovascular;  Laterality: N/A;   IMPLANTABLE CARDIOVERTER DEFIBRILLATOR IMPLANT  09/06/09   BSX dual chamber ICD implanted in [place]  for cardiac arrest 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   INGUINAL HERNIA REPAIR Right ~ 1995   INGUINAL HERNIA REPAIR Left 04/10/2022   Procedure: HERNIA REPAIR INGUINAL ADULT;  Surgeon: Ebbie Cough, MD;  [facility] OR;  Service: General;  Laterality: Left;   LEFT HEART CATH AND CORONARY ANGIOGRAPHY N/A 03/19/2022   Procedure: LEFT HEART CATH AND CORONARY ANGIOGRAPHY;  Surgeon: Jordan, Peter M, MD;  Location: [facility] INVASIVE CV LAB;  Service: Cardiovascular;  Laterality: N/A;   LEFT HEART CATHETERIZATION WITH CORONARY  ANGIOGRAM N/A 11/25/2012   demonstrated EF 30%, inferior akinesis with mild hypokinesis of all walls, patent LAD and RCA stents; ostial PDA with 80-90% obstruction with medical therapy recommended   MOHS SURGERY  2008   nose, skin graft   POLYPECTOMY  12/21/2017   Procedure: POLYPECTOMY;  Surgeon: Abran Norleen SAILOR, MD;  Location: [facility] ENDOSCOPY;  Service: Endoscopy;;   RETINAL DETACHMENT SURGERY Right 2013   TENOLYSIS Right 12/21/2013   Procedure: TENOLYSIS FLEXOR CARPI RADIALIS ,DEBRIDEMENT RIGHT JOINT WRIST,DEBRIDEMENT SCAPHOTRAPEZIAL TRAPEZOID, REPAIR OF EXTENSOR HOOD;  Surgeon: Arley Curia, MD;  Location: [facility];  Service: Orthopedics;  Laterality: Right;   TOE SURGERY Right 09/2019   3rd toe/hammer toe   V-TACH ABLATION  12/21/2014   VIDEO BRONCHOSCOPY Bilateral 01/09/2016   Procedure: VIDEO BRONCHOSCOPY WITHOUT FLUORO;  Surgeon: Vicenta KATHEE Lennert, MD;  Location: [facility] ENDOSCOPY;  Service: Cardiopulmonary;  Laterality: Bilateral;   HPI:  83 yo male presenting to ED 12/14 with pleuritic chest pain and SOB. Admitted with COPD exacerbation and pneumonia. CTA shows rounded ill-defined mass-like consolidative opacity, compatible with pneumonia with additional ill-defined nodular opacities identified in the RLL with evidence of peripheral small airway impaction and a moderate hiatal hernia. Seen by SLP 03/04/24 with functional appearing swallowing and for dysphonia 08/03/22. Stroboscopy 08/19/22 shows mild diffuse erythema, minimal posterior laryngeal edema, preserved vocal fold mobility with midfold atrophy, resulting in incomplete glottic closure. ENT confirms the presence of tonsilloliths, which are bothersome to the pt but that he does not wish to remove. PMH: COPD, CHF, CAD, MI, aortic aneurysm, ACDF, chronic neck/back pain, anemia, depression, idiopathic thrombocytopenia    Assessment / Plan / Recommendation  Clinical Impression  Will proceed with MBS for further assessment given acute  pneumonia, known glottal insufficiency, and pt's ongoing complaints related to swallowing. Continue current diet in the interim, using a slow rate and frequent rest breaks to better coordinate breathing and swallowing.   Pt presents with dysphonia, which overall appears consistent with previous SLP and ENT visits that confirmed mild vocal fold bowing with incomplete glottic closure. He continues to complain of an intense burning sensation lasting ~15 minutes 2x/day in addition to shortness of breath while masticating. He donned his upper partial at the beginning of the evaluation, which he always wears while eating. Sips of thin liquids were followed by one instance of immediate coughing. Mastication is prolonged with pt taking frequent rest breaks due to increased WOB. He generally reports that the sensation of swallowing food is uncomfortable and he has grown nervous to eat/drink.  SLP Visit Diagnosis: Dysphagia, unspecified (R13.10)    Aspiration Risk  Mild aspiration risk    Diet Recommendation           Other Recommendations Oral Care Recommendations: Oral care BID     Swallow Evaluation Recommendations Recommendations: PO diet PO Diet Recommendation: Regular;Thin  liquids (Level 0) Liquid Administration via: Cup;Straw Medication Administration: Whole meds with liquid Supervision: Patient able to self-feed Swallowing strategies  : Slow rate;Small bites/sips Postural changes: Position pt fully upright for meals;Stay upright 30-60 min after meals Oral care recommendations: Oral care BID (2x/day)   Assistance Recommended at Discharge    Functional Status Assessment    Frequency and Duration            Prognosis Prognosis for improved oropharyngeal function: Good Barriers to Reach Goals: Time post onset      Swallow Study   General HPI: 83 yo male presenting to ED 12/14 with pleuritic chest pain and SOB. Admitted with COPD exacerbation and pneumonia. CTA shows rounded ill-defined  mass-like consolidative opacity, compatible with pneumonia with additional ill-defined nodular opacities identified in the RLL with evidence of peripheral small airway impaction and a moderate hiatal hernia. Seen by SLP 03/04/24 with functional appearing swallowing and for dysphonia 08/03/22. Stroboscopy 08/19/22 shows mild diffuse erythema, minimal posterior laryngeal edema, preserved vocal fold mobility with midfold atrophy, resulting in incomplete glottic closure. ENT confirms the presence of tonsilloliths, which are bothersome to the pt but that he does not wish to remove. PMH: COPD, CHF, CAD, MI, aortic aneurysm, ACDF, chronic neck/back pain, anemia, depression, idiopathic thrombocytopenia Type of Study: Bedside Swallow Evaluation Previous Swallow Assessment: see HPI Diet Prior to this Study: Regular;Thin liquids (Level 0) Temperature Spikes Noted: No Respiratory Status: Nasal cannula History of Recent Intubation: No Behavior/Cognition: Alert;Cooperative;Pleasant mood Oral Cavity Assessment: Within Functional Limits Oral Care Completed by SLP: No Oral Cavity - Dentition: Missing dentition;Dentures, top Vision: Functional for self-feeding Self-Feeding Abilities: Able to feed self Patient Positioning: Upright in bed Baseline Vocal Quality: Breathy Volitional Cough: Strong Volitional Swallow: Able to elicit    Oral/Motor/Sensory Function Overall Oral Motor/Sensory Function: Within functional limits   Ice Chips Ice chips: Not tested   Thin Liquid Thin Liquid: Impaired Presentation: Straw;Self Fed Pharyngeal  Phase Impairments: Cough - Immediate    Nectar Thick Nectar Thick Liquid: Not tested   Honey Thick Honey Thick Liquid: Not tested   Puree Puree: Not tested   Solid     Solid: Within functional limits Presentation: Self Fed      [name], M.A., CCC-SLP Speech Language Pathology, Acute Rehabilitation Services  Secure Chat preferred [phone]  03/15/2024,11:41 AM

## 2024-03-15 NOTE — Plan of Care (Signed)
  Problem: Education: Goal: Ability to describe self-care measures that may prevent or decrease complications (Diabetes Survival Skills Education) will improve Outcome: Progressing   Problem: Coping: Goal: Ability to adjust to condition or change in health will improve Outcome: Progressing

## 2024-03-16 ENCOUNTER — Other Ambulatory Visit (HOSPITAL_COMMUNITY): Payer: Self-pay

## 2024-03-16 DIAGNOSIS — J189 Pneumonia, unspecified organism: Secondary | ICD-10-CM | POA: Diagnosis not present

## 2024-03-16 DIAGNOSIS — T17908S Unspecified foreign body in respiratory tract, part unspecified causing other injury, sequela: Secondary | ICD-10-CM | POA: Diagnosis not present

## 2024-03-16 LAB — BASIC METABOLIC PANEL WITH GFR
Anion gap: 6 (ref 5–15)
BUN: 14 mg/dL (ref 8–23)
CO2: 31 mmol/L (ref 22–32)
Calcium: 8.7 mg/dL — ABNORMAL LOW (ref 8.9–10.3)
Chloride: 96 mmol/L — ABNORMAL LOW (ref 98–111)
Creatinine, Ser: 0.41 mg/dL — ABNORMAL LOW (ref 0.61–1.24)
GFR, Estimated: >60 mL/min (ref >60)
Glucose, Bld: 107 mg/dL — ABNORMAL HIGH (ref 70–99)
Potassium: 4.1 mmol/L (ref 3.5–5.1)
Sodium: 132 mmol/L — ABNORMAL LOW (ref 135–145)

## 2024-03-16 LAB — CBC
HCT: 33.9 % — ABNORMAL LOW (ref 39.0–52.0)
Hemoglobin: 10.5 g/dL — ABNORMAL LOW (ref 13.0–17.0)
MCH: 29.7 pg (ref 26.0–34.0)
MCHC: 31 g/dL (ref 30.0–36.0)
MCV: 95.8 fL (ref 80.0–100.0)
Platelets: 244 K/uL (ref 150–400)
RBC: 3.54 MIL/uL — ABNORMAL LOW (ref 4.22–5.81)
RDW: 18.8 % — ABNORMAL HIGH (ref 11.5–15.5)
WBC: 8.1 K/uL (ref 4.0–10.5)
nRBC: 0.2 % (ref 0.0–0.2)

## 2024-03-16 LAB — GLUCOSE, CAPILLARY
Glucose-Capillary: 201 mg/dL — ABNORMAL HIGH (ref 70–99)
Glucose-Capillary: 150 mg/dL — ABNORMAL HIGH (ref 70–99)
Glucose-Capillary: 185 mg/dL — ABNORMAL HIGH (ref 70–99)
Glucose-Capillary: 141 mg/dL — ABNORMAL HIGH (ref 70–99)

## 2024-03-16 MED ORDER — PREDNISONE 20 MG PO TABS
40.0000 mg | ORAL_TABLET | Freq: Every day | ORAL | Status: DC
  Administered 2024-03-17 – 2024-03-22 (×6): 40 mg via ORAL
  Filled 2024-03-16 (×9): qty 2

## 2024-03-16 MED ORDER — BISACODYL 10 MG RE SUPP
10.0000 mg | Freq: Every day | RECTAL | Status: DC | PRN
  Administered 2024-03-16 – 2024-03-21 (×2): 10 mg via RECTAL
  Filled 2024-03-16 (×3): qty 1

## 2024-03-16 MED ORDER — SMOG ENEMA: 960.0000 mL | Freq: Once | RECTAL | Status: DC | PRN

## 2024-03-16 NOTE — Care Management Important Message (Signed)
 Important Message  Patient Details  Name: Steven Guzman MRN: [id number] Date of Birth: 04-03-1940   Important Message Given:  Yes - Medicare IM     [name] 03/16/2024, 12:58 PM

## 2024-03-16 NOTE — Progress Notes (Signed)
 Speech Language Pathology Treatment: Dysphagia  Patient Details Name: Steven Guzman MRN: [id number] DOB: 1940/11/06 Today's Date: 03/16/2024 Time: 8641-8585 SLP Time Calculation (min) (ACUTE ONLY): 16 min  Assessment / Plan / Recommendation Clinical Impression  Reviewed MBS, compensatory strategies, and respiratory problems/swallowing handout. Pt verbalizes understanding of ways to minimize risk of adverse events in the presence of aspiration but may need reinforcement for oral care BID in addition to before PO intake, to avoid straws, and to take meds whole with puree. He took sips of water without overt s/s of aspiration but needed frequent rest breaks between sips. Encouraged him to continue to use rest breaks, especially when increased coughing/throat clearing is noted when eating/drinking. Continue current diet with ongoing SLP f/u. Could consider use of EMST once respiratory status has improved outside of acute illness.    HPI HPI: 83 yo male presenting to ED 12/14 with pleuritic chest pain and SOB. Admitted with COPD exacerbation and pneumonia. CTA shows rounded ill-defined mass-like consolidative opacity, compatible with pneumonia with additional ill-defined nodular opacities identified in the RLL with evidence of peripheral small airway impaction and a moderate hiatal hernia. Seen by SLP 03/04/24 with functional appearing swallowing and for dysphonia 08/03/22. Stroboscopy 08/19/22 shows mild diffuse erythema, minimal posterior laryngeal edema, preserved vocal fold mobility with midfold atrophy, resulting in incomplete glottic closure. ENT confirms the presence of tonsilloliths, which are bothersome to the pt but that he does not wish to remove. PMH: COPD, CHF, CAD, GERD, MI, aortic aneurysm, ACDF, chronic neck/back pain, anemia, depression, idiopathic thrombocytopenia      SLP Plan  Continue with current plan of care        Swallow Evaluation Recommendations   Recommendations: PO  diet PO Diet Recommendation: Regular;Thin liquids (Level 0) Liquid Administration via: Cup;No straw Medication Administration: Whole meds with puree Supervision: Patient able to self-feed;Intermittent supervision/cueing for swallowing strategies Postural changes: Position pt fully upright for meals Oral care recommendations: Oral care BID (2x/day);Oral care before PO     Recommendations                     Oral care BID;Oral care before and after PO   Frequent or constant Supervision/Assistance Dysphagia, pharyngeal phase (R13.13)     Continue with current plan of care     Steven Guzman, M.A., CCC-SLP Speech Language Pathology, Acute Rehabilitation Services  Secure Chat preferred [phone]   03/16/2024, 2:59 PM

## 2024-03-16 NOTE — Progress Notes (Signed)
 PROGRESS NOTE    Steven Guzman  FMW:[id number] DOB: 06/07/1940 DOA: 03/12/2024 PCP: Esmeralda Morton SAUNDERS, PA-C    Brief Narrative:  Steven Guzman is a 83 yr old man with HTN, COPD, chronic hypoxic respiratory failure, CAD, HFrEF, and VT with ICD presenting with pleuritic chest pain and SOB.  Patient reports a recent hospitalization, posthospitalization remain really weak, developed left-sided chest pain with shortness of breath overnight.  CTA is negative for PE,7.3 x 6.6 cm rounded ill-defined masslike consolidative opacity - pneumonia (vs neoplasm). 4.5 cm diameter ascending thoracic aortic aneurysm.  Slowly improving.  Will plan to discharge with home health and on oxygen .   Assessment and Plan: * Pneumonia Satting 100% on 3 L of oxygen -wean to room air as able (was discharged home 1 week ago on nasal cannula but was not on prior) - Continue antibiotics - incentive spirometer and flutter valve -obtain sputum cultures - Continue steroids for now- wean to PO -SLP eval -will need follow up xray/scan to ensure resolution  Hyponatremia - Stable  Acute on chronic combined systolic and diastolic CHF (congestive heart failure) (HCC) - Stable - Resume home meds  Atypical chest pain Brief left-sided chest pain-resolved  Chronic idiopathic thrombocytopenia (HCC) Currently stable monitoring  Bilateral sensorineural hearing loss Noted  Degenerative cervical spinal stenosis - No current complaint, denies any numbness or tingling, acute on chronic generalized weaknesses -Will consult PT/OT for evaluation-Home health  B12 deficiency Resume supplements   Anemia Anemia of chronic disease with iron deficiency anemia -Continue ferrous sulfate , H&H stable  BPH (benign prostatic hyperplasia) Monitoring for urinary retention, currently no complaint, continue Flomax   AF (atrial fibrillation) (HCC) Stable -will continue Coreg , mexiletine, amiodarone , and Eliquis   - Defibrillator  in place  COPD GOLD GRADE C - Chronic condition,-compliant with oxygen  at baseline 3 L, satting 100% -Mild shortness of breath with no wheezing, initiated antibiotics, short burst of steroids - Encouraging incentive spirometer, flutter valve, mucolytics  ICD (implantable cardioverter-defibrillator) in place Assessed in place, no recent history of discharge -Patient reports no recent changes.  Stable  Hyperlipidemia - On Repatha   Essential hypertension -hypotensive, holding home heart medications - Currently listed Meds spironolactone , Lasix , Coreg , amiodarone  .. Monitoring BP closely, restarting above meds judiciously   CAD (coronary artery disease) Stable, reviewing home medication resuming according Denies any chest pain  History of abdominal aortic aneurysm (AAA) Noted-stable  Major depressive disorder with single episode, in partial remission Currently stable, continue home medication of Lexapro   GERD (gastroesophageal reflux disease) Continue PPI  Prediabetes Last A1c 5 months ago 5.8 -Due to steroids, anticipating hyperglycemia - SSI coverage     DVT prophylaxis: SCDs Start: 03/12/24 0808 apixaban  (ELIQUIS ) tablet 5 mg    Code Status: Full Code   Disposition Plan:  Level of care: Telemetry Status is: Inpatient     Consultants:     Subjective: Not using the flutter valve is much as he should  Objective: Vitals:   03/16/24 0350 03/16/24 0750 03/16/24 0840 03/16/24 1105  BP: 97/87 112/70  126/82  Pulse: 80 82 95 77  Resp: (!) 28 17 18 18   Temp: (!) 97.4 F (36.3 C) 97.6 F (36.4 C)  97.6 F (36.4 C)  TempSrc: Oral Oral  Oral  SpO2: 94% 94% 92% 99%  Weight:      Height:        Intake/Output Summary (Last 24 hours) at 03/16/2024 1128 Last data filed at 03/16/2024 1100 Gross per 24 hour  Intake 580 ml  Output 2650 ml  Net -2070 ml   Filed Weights   03/14/24 0426 03/15/24 0439 03/16/24 0349  Weight: 64.5 kg 65 kg 64.9 kg     Examination:   General: Appearance:    Frail appearing male in no acute distress     Lungs:   On nasal cannula, appears more comfortable today  Heart:    Normal heart rate.   MS:   All extremities are intact.    Neurologic:   Awake, alert       Data Reviewed: I have personally reviewed following labs and imaging studies  CBC: Recent Labs  Lab 03/12/24 0110 03/13/24 0326 03/14/24 0216 03/15/24 0239 03/16/24 0304  WBC 16.7* 16.3* 10.9* 8.3 8.1  HGB 11.3* 9.8* 10.0* 9.9* 10.5*  HCT 37.5* 32.3* 33.0* 32.5* 33.9*  MCV 97.9 97.6 96.5 95.6 95.8  PLT 221 162 183 206 244   Basic Metabolic Panel: Recent Labs  Lab 03/12/24 0110 03/12/24 0811 03/13/24 0326 03/14/24 0216 03/14/24 1036 03/15/24 0239 03/16/24 0304  NA 131*  --  128* 132*  --  133* 132*  K 4.9  --  4.1 4.3  --  4.1 4.1  CL 89*  --  91* 94*  --  96* 96*  CO2 30  --  27 29  --  30 31  GLUCOSE 150*  --  103* 133*  --  111* 107*  BUN 20  --  12 16  --  13 14  CREATININE 0.87  --  0.57* 0.63  --  0.45* 0.41*  CALCIUM  9.0  --  8.3* 8.4*  --  8.3* 8.7*  MG  --  1.8  --   --  2.2  --   --   PHOS  --  2.9  --   --   --   --   --    GFR: Estimated Creatinine Clearance: 62 mL/min (A) (by C-G formula based on SCr of 0.41 mg/dL (L)). Liver Function Tests: No results for input(s): AST, ALT, ALKPHOS, BILITOT, PROT, ALBUMIN in the last 168 hours. No results for input(s): LIPASE, AMYLASE in the last 168 hours. No results for input(s): AMMONIA in the last 168 hours. Coagulation Profile: Recent Labs  Lab 03/13/24 0326  INR 1.7*   Cardiac Enzymes: No results for input(s): CKTOTAL, CKMB, CKMBINDEX, TROPONINI in the last 168 hours. BNP (last 3 results) No results for input(s): PROBNP in the last 8760 hours. HbA1C: No results for input(s): HGBA1C in the last 72 hours.  CBG: Recent Labs  Lab 03/15/24 1148 03/15/24 1559 03/15/24 1935 03/15/24 2105 03/16/24 0617  GLUCAP 140*  132* 210* 141* 201*   Lipid Profile: No results for input(s): CHOL, HDL, LDLCALC, TRIG, CHOLHDL, LDLDIRECT in the last 72 hours. Thyroid  Function Tests: No results for input(s): TSH, T4TOTAL, FREET4, T3FREE, THYROIDAB in the last 72 hours. Anemia Panel: No results for input(s): VITAMINB12, FOLATE, FERRITIN, TIBC, IRON, RETICCTPCT in the last 72 hours. Sepsis Labs: Recent Labs  Lab 03/12/24 0811 03/13/24 0326  PROCALCITON <0.10  --   LATICACIDVEN  --  0.9    Recent Results (from the past 240 hours)  Culture, blood (routine x 2) Call MD if unable to obtain prior to antibiotics being given     Status: None (Preliminary result)   Collection Time: 03/12/24  8:11 AM   Specimen: BLOOD  Result Value Ref Range Status   Specimen Description BLOOD RIGHT ANTECUBITAL  Final   Special Requests   Final  BOTTLES DRAWN AEROBIC AND ANAEROBIC Blood Culture results may not be optimal due to an inadequate volume of blood received in culture bottles   Culture   Final    NO GROWTH 4 DAYS Performed at [facility], [address], [place], [place] [address]    Report Status PENDING  Incomplete  Culture, blood (routine x 2) Call MD if unable to obtain prior to antibiotics being given     Status: None (Preliminary result)   Collection Time: 03/12/24 10:59 AM   Specimen: BLOOD RIGHT ARM  Result Value Ref Range Status   Specimen Description BLOOD RIGHT ARM  Final   Special Requests   Final    BOTTLES DRAWN AEROBIC AND ANAEROBIC Blood Culture adequate volume   Culture   Final    NO GROWTH 4 DAYS Performed at [facility], [address], [place], [place] [address]    Report Status PENDING  Incomplete  Expectorated Sputum Assessment w Gram Stain, Rflx to Resp Cult     Status: None   Collection Time: 03/14/24  8:12 AM   Specimen: Expectorated Sputum  Result Value Ref Range Status   Specimen Description EXPECTORATED SPUTUM  Final   Special Requests NONE   Final   Sputum evaluation   Final    THIS SPECIMEN IS ACCEPTABLE FOR SPUTUM CULTURE Performed at [facility], [address], [place], [place] [address]    Report Status 03/15/2024 FINAL  Final  Culture, Respiratory w Gram Stain     Status: None (Preliminary result)   Collection Time: 03/14/24  8:12 AM  Result Value Ref Range Status   Specimen Description EXPECTORATED SPUTUM  Final   Special Requests NONE Reflexed from [id number]  Final   Gram Stain   Final    FEW WBC PRESENT, PREDOMINANTLY PMN RARE GRAM NEGATIVE RODS RARE GRAM POSITIVE RODS Performed at [facility], [address], [place], [place] [address]    Culture PENDING  Incomplete   Report Status PENDING  Incomplete         Radiology Studies: DG Swallowing Func-Speech Pathology Result Date: 03/15/2024 Table formatting from the original result was not included. Modified Barium Swallow Study Patient Details Name: JEOFFREY ELEAZER MRN: [id number] Date of Birth: [birth year] Today's Date: 03/15/2024 HPI/PMH: HPI: 83 yo male presenting to ED 12/14 with pleuritic chest pain and SOB. Admitted with COPD exacerbation and pneumonia. CTA shows rounded ill-defined mass-like consolidative opacity, compatible with pneumonia with additional ill-defined nodular opacities identified in the RLL with evidence of peripheral small airway impaction and a moderate hiatal hernia. Seen by SLP 03/04/24 with functional appearing swallowing and for dysphonia 08/03/22. Stroboscopy 08/19/22 shows mild diffuse erythema, minimal posterior laryngeal edema, preserved vocal fold mobility with midfold atrophy, resulting in incomplete glottic closure. ENT confirms the presence of tonsilloliths, which are bothersome to the pt but that he does not wish to remove. PMH: COPD, CHF, CAD, GERD, MI, aortic aneurysm, ACDF, chronic neck/back pain, anemia, depression, idiopathic thrombocytopenia Clinical Impression: Pt exhibits moderate pharyngeal dysphagia characterized by  mistiming, which may be a result of effort required to coordinate breathing and swallowing. Aspiration occurs when taking large, successive straw sips of thin liquids and while it is sensed, his cough cannot expel the bolus completely (PAS 7). This appears to happen as the bolus reaches the pyriform sinuses before the swallow is initiated and spills over into the larynx before he achieves complete closure. His second line of defense is ineffective  secondary to known vocal fold atrophy with incomplete glottic closure. No aspiration occurs with cup sips of thin liquids and trace penetrates are ejected with a cued cough (PAS 4). While aspiration was also prevented with use of thickened liquids, suspect increased WOB has the potential to affect timing and coordination throughout the course of a meal and if aspirated, thickened liquids are thought to cause more harm to his pulmonary health. Discussed factors that are within pt's control to minimize the risk of adverse events in the presence of aspiration, including oral care BID in addition to before PO intake and mobility. Pt wishes to continue regular diet with thin liquids via cup only and meds whole in puree. Will f/u for ongoing education. Recommend he continue dysphagia interventions with HH or OP SLP. Factors that may increase risk of adverse event in presence of aspiration Noe & Lianne 2021): Factors that may increase risk of adverse event in presence of aspiration Noe & Lianne 2021): Respiratory or GI disease; Inadequate oral hygiene Recommendations/Plan: Swallowing Evaluation Recommendations Swallowing Evaluation Recommendations Recommendations: PO diet PO Diet Recommendation: Regular; Thin liquids (Level 0) Liquid Administration via: Cup; No straw Medication Administration: Whole meds with puree Supervision: Patient able to self-feed; Intermittent supervision/cueing for swallowing strategies Swallowing strategies  : Slow rate; Small bites/sips; Hard  cough after swallowing Postural changes: Position pt fully upright for meals; Stay upright 30-60 min after meals Oral care recommendations: Oral care BID (2x/day); Oral care before PO Treatment Plan Treatment Plan Treatment recommendations: Therapy as outlined in treatment plan below Follow-up recommendations: Home health SLP Functional status assessment: Patient has had a recent decline in their functional status and demonstrates the ability to make significant improvements in function in a reasonable and predictable amount of time. Treatment frequency: Min 2x/week Treatment duration: 2 weeks Interventions: Aspiration precaution training; Compensatory techniques; Patient/family education Recommendations Recommendations for follow up therapy are one component of a multi-disciplinary discharge planning process, led by the attending physician.  Recommendations may be updated based on patient status, additional functional criteria and insurance authorization. Assessment: Orofacial Exam: Orofacial Exam Oral Cavity: Oral Hygiene: WFL Oral Cavity - Dentition: Missing dentition; Dentures, top Orofacial Anatomy: WFL Oral Motor/Sensory Function: WFL Anatomy: Anatomy: Suspected cervical osteophytes; Presence of cervical hardware; Prominent cricopharyngeus Boluses Administered: Boluses Administered Boluses Administered: Thin liquids (Level 0); Mildly thick liquids (Level 2, nectar thick); Moderately thick liquids (Level 3, honey thick); Puree; Solid  Oral Impairment Domain: Oral Impairment Domain Lip Closure: No labial escape Tongue control during bolus hold: Posterior escape of less than half of bolus Bolus preparation/mastication: Timely and efficient chewing and mashing Bolus transport/lingual motion: Brisk tongue motion Oral residue: Complete oral clearance Location of oral residue : N/A Initiation of pharyngeal swallow : Pyriform sinuses  Pharyngeal Impairment Domain: Pharyngeal Impairment Domain Soft palate elevation: No  bolus between soft palate (SP)/pharyngeal wall (PW) Laryngeal elevation: Complete superior movement of thyroid  cartilage with complete approximation of arytenoids to epiglottic petiole Anterior hyoid excursion: Complete anterior movement Epiglottic movement: Complete inversion Laryngeal vestibule closure: Complete, no air/contrast in laryngeal vestibule Pharyngeal stripping wave : Present - complete Pharyngeal contraction (A/P view only): N/A Pharyngoesophageal segment opening: Partial distention/partial duration, partial obstruction of flow Tongue base retraction: Trace column of contrast or air between tongue base and PPW Pharyngeal residue: Collection of residue within or on pharyngeal structures Location of pharyngeal residue: Tongue base; Valleculae  Esophageal Impairment Domain: Esophageal Impairment Domain Esophageal clearance upright position: Esophageal retention Pill: Pill Consistency administered: Thin liquids (Level 0)  Thin liquids (Level 0): Impaired (see clinical impressions) Penetration/Aspiration Scale Score: Penetration/Aspiration Scale Score 1.  Material does not enter airway: Moderately thick liquids (Level 3, honey thick); Puree; Solid; Pill 3.  Material enters airway, remains ABOVE vocal cords and not ejected out: Mildly thick liquids (Level 2, nectar thick) 7.  Material enters airway, passes BELOW cords and not ejected out despite cough attempt by patient: Thin liquids (Level 0) Compensatory Strategies: Compensatory Strategies Compensatory strategies: No   General Information: Caregiver present: No  Diet Prior to this Study: Regular; Thin liquids (Level 0)   Temperature : Normal   Respiratory Status: Increased WOB   Supplemental O2: Nasal cannula   History of Recent Intubation: No  Behavior/Cognition: Alert; Cooperative; Pleasant mood Self-Feeding Abilities: Able to self-feed Baseline vocal quality/speech: Dysphonic Volitional Cough: Able to elicit Volitional Swallow: Able to elicit Exam  Limitations: No limitations Goal Planning: Prognosis for improved oropharyngeal function: Good Barriers to Reach Goals: Time post onset No data recorded Patient/Family Stated Goal: none stated Consulted and agree with results and recommendations: Patient; Nurse; Physician Pain: Pain Assessment Pain Assessment: No/denies pain Pain Intervention(s): Monitored during session End of Session: Start Time:SLP Start Time (ACUTE ONLY): 1252 Stop Time: SLP Stop Time (ACUTE ONLY): 1330 Time Calculation:SLP Time Calculation (min) (ACUTE ONLY): 38 min Charges: SLP Evaluations $ SLP Speech Visit: 1 Visit SLP Evaluations $BSS Swallow: 1 Procedure $MBS Swallow: 1 Procedure SLP visit diagnosis: SLP Visit Diagnosis: Dysphagia, pharyngeal phase (R13.13) Past Medical History: Past Medical History: Diagnosis Date  AICD (automatic cardioverter/defibrillator) present   [employer] MOMENTUM EL ICD D121/ [phone]  Aneurysm   a. Aneurysmal infrarenal aorta up to 33 mm on CT 10/2014, recommended f/u due 10/2017  Anginal pain   Anxiety   Arthritis   right foot  Basal cell carcinoma of nose   S/P MOHS  Biliary acute pancreatitis   CAD (coronary artery disease)   a. s/p MI in 1994 with PCI to LAD at that time b. cath 10/2012 demonstrated EF 30%, inferior akinesis with mild hypokinesis of all walls, patent LAD and RCA stents; ostial PDA with 80-90% obstruction with medical therapy recommended   Chronic systolic CHF (congestive heart failure) (HCC)   EF 30 to 35 % as of 09/2014.   CKD (chronic kidney disease), stage III (HCC)   Complication of anesthesia 10/2014  had to have defibrillator w/ERCP- CODED after having gallstones removed  COPD (chronic obstructive pulmonary disease) (HCC)   a. followed by pulmonary, COPD GOLD stage II  Depression   Diverticulosis of colon 07/2014  noted on CT  Dyspnea   with exertion  GERD (gastroesophageal reflux disease)   Hiatal hernia   large  History of kidney stones   passed stone  Hyperglycemia 10/2012   Hyperlipidemia   Hypertension   Myocardial infarction [facility]) 1994; 2011  Pneumonia 1946; 2015  Prostate enlargement 07/2014  observed on CT  Tobacco abuse   Ventricular tachycardia (HCC)   a. 08/2009 s/p BSX E110 Teligen 100 AICD, ser#: [id number];  b. 08/2008 VT req ATP - detection reprogrammed from 160 to 150. c. EPS and VT ablation by Dr. Waddell 12/21/2014 Past Surgical History: Past Surgical History: Procedure Laterality Date  BIOPSY  12/21/2017  Procedure: BIOPSY;  Surgeon: Abran Norleen SAILOR, MD;  Location: [facility];  Service: Endoscopy;;  CATARACT EXTRACTION W/ INTRAOCULAR LENS  IMPLANT, BILATERAL Bilateral ~ 2011  COLONOSCOPY    COLONOSCOPY WITH PROPOFOL  N/A 12/21/2017  Procedure: COLONOSCOPY WITH PROPOFOL ;  Surgeon: Abran Norleen SAILOR,  MD;  Location: [facility];  Service: Endoscopy;  Laterality: N/A;  ELECTROPHYSIOLOGIC STUDY N/A 12/21/2014  Procedure: V Tach Ablation;  Surgeon: Danelle LELON Birmingham, MD;  Location: [facility] INVASIVE CV LAB;  Service: Cardiovascular;  Laterality: N/A;  ERCP N/A 11/16/2014  Procedure: ENDOSCOPIC RETROGRADE CHOLANGIOPANCREATOGRAPHY (ERCP);  Surgeon: Lamar JONETTA Aho, MD;  Location: [facility] ENDOSCOPY;  Service: Endoscopy;  Laterality: N/A;  ESOPHAGOGASTRODUODENOSCOPY (EGD) WITH PROPOFOL  N/A 12/21/2017  Procedure: ESOPHAGOGASTRODUODENOSCOPY (EGD) WITH PROPOFOL ;  Surgeon: Abran Norleen SAILOR, MD;  Location: [facility];  Service: Endoscopy;  Laterality: N/A;  EYE SURGERY    FOOT SURGERY Left 2005  fixed bone that stuck out in my ankle area  HEMORRHOID BANDING    ICD GENERATOR CHANGEOUT N/A 04/20/2022  Procedure: ICD GENERATOR CHANGEOUT;  Surgeon: Birmingham Danelle LELON, MD;  Location: [facility] INVASIVE CV LAB;  Service: Cardiovascular;  Laterality: N/A;  IMPLANTABLE CARDIOVERTER DEFIBRILLATOR IMPLANT  09/06/09  BSX dual chamber ICD implanted in [place]  for cardiac arrest 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  INGUINAL HERNIA REPAIR Right ~ 1995  INGUINAL HERNIA REPAIR Left 04/10/2022  Procedure: HERNIA REPAIR INGUINAL ADULT;  Surgeon: Ebbie Cough, MD;  Location: [facility] OR;  Service: General;  Laterality: Left;  LEFT HEART CATH AND CORONARY ANGIOGRAPHY N/A 03/19/2022  Procedure: LEFT HEART CATH AND CORONARY ANGIOGRAPHY;  Surgeon: Jordan, Peter M, MD;  Location: [facility] INVASIVE CV LAB;  Service: Cardiovascular;  Laterality: N/A;  LEFT HEART CATHETERIZATION WITH CORONARY ANGIOGRAM N/A 11/25/2012  demonstrated EF 30%, inferior akinesis with mild hypokinesis of all walls, patent LAD and RCA stents; ostial PDA with 80-90% obstruction with medical therapy recommended  MOHS SURGERY  2008  nose, skin graft  POLYPECTOMY  12/21/2017  Procedure: POLYPECTOMY;  Surgeon: Abran Norleen SAILOR, MD;  Location: [facility];  Service: Endoscopy;;  RETINAL DETACHMENT SURGERY Right 2013  TENOLYSIS Right 12/21/2013  Procedure: TENOLYSIS FLEXOR CARPI RADIALIS ,DEBRIDEMENT RIGHT JOINT WRIST,DEBRIDEMENT SCAPHOTRAPEZIAL TRAPEZOID, REPAIR OF EXTENSOR HOOD;  Surgeon: Arley Curia, MD;  Location: [facility];  Service: Orthopedics;  Laterality: Right;  TOE SURGERY Right 09/2019  3rd toe/hammer toe  V-TACH ABLATION  12/21/2014  VIDEO BRONCHOSCOPY Bilateral 01/09/2016  Procedure: VIDEO BRONCHOSCOPY WITHOUT FLUORO;  Surgeon: Vicenta KATHEE Lennert, MD;  Location: [facility];  Service: Cardiopulmonary;  Laterality: Bilateral; [name], M.A., CCC-SLP Speech Language Pathology, Acute Rehabilitation Services Secure Chat preferred [phone] 03/15/2024, 2:12 PM        Scheduled Meds:  acidophilus  1 capsule Oral BID   amiodarone   200 mg Oral Daily   apixaban   5 mg Oral BID   arformoterol   15 mcg Nebulization BID   And   umeclidinium bromide   1 puff Inhalation Daily   aspirin  EC  81 mg Oral Daily   carvedilol   3.125 mg Oral BID   doxycycline   100 mg Oral Q12H   empagliflozin   10 mg Oral Daily   escitalopram   20 mg Oral Daily   feeding supplement  237 mL Oral BID BM   ferrous sulfate    Oral Daily   furosemide   40 mg Oral Daily   guaiFENesin   1,200 mg Oral Q8H   insulin   aspart  0-9 Units Subcutaneous TID WC   lidocaine   1 patch Transdermal Q24H   magnesium  oxide  400 mg Oral Daily   melatonin  3 mg Oral QHS   methylPREDNISolone  (SOLU-MEDROL ) injection  40 mg Intravenous Q24H   mexiletine  200 mg Oral BID   nystatin   5 mL Oral QID   pantoprazole   40  mg Oral BID   pregabalin   25 mg Oral BID   senna-docusate  1 tablet Oral BID   sodium chloride  flush  3 mL Intravenous Q12H   sodium chloride  flush  3 mL Intravenous Q12H   tamsulosin   0.4 mg Oral Daily   Continuous Infusions:  ampicillin -sulbactam (UNASYN ) IV 3 g (03/16/24 0717)     LOS: 4 days    Time spent: 45 minutes spent on chart review, discussion with nursing staff, consultants, updating family and interview/physical exam; more than 50% of that time was spent in counseling and/or coordination of care.    Harlene RAYMOND Bowl, DO [facility] Available via Epic secure chat 7am-7pm After these hours, please refer to coverage provider listed on [email or web address] 03/16/2024, 11:28 AM

## 2024-03-16 NOTE — Progress Notes (Signed)
 Physical Therapy Treatment Patient Details Name: Steven Guzman MRN: [id number] DOB: Nov 05, 1940 Today's Date: 03/16/2024   History of Present Illness 83 yo M adm 12/14 SOB and CP. Pt with workup for COPD exacerbation lung mass and PNA PMH COPD, CHF, CAD MI, aortic aneurysm, ACDF,  chronic neck/back pain, anemia, depression, chronic idiopathic thrombocytopenia,    PT Comments  Pt demonstrating good progress this session, progressing distance ambulated with AD and no physical assistance. Pt demonstrates good understanding of activity limitations, requesting seated rest breaks when feeling increased fatigue and SOB. Pt encouraged to continue to use flutter device to assist with secretion removal with pt verbalizing understanding. PT will continue to treat pt while he is admitted. Recommending HHPT at discharge to address remaining mobility deficits and optimize return to PLOF.     If plan is discharge home, recommend the following: A little help with walking and/or transfers;A little help with bathing/dressing/bathroom;Assistance with cooking/housework;Assist for transportation   Can travel by private Psychologist, Clinical (4 wheels)    Recommendations for Other Services       Precautions / Restrictions Precautions Precautions: Fall Recall of Precautions/Restrictions: Intact Precaution/Restrictions Comments: watch O2 Restrictions Weight Bearing Restrictions Per Provider Order: No     Mobility  Bed Mobility Overal bed mobility: Needs Assistance Bed Mobility: Supine to Sit, Sit to Supine     Supine to sit: Supervision, HOB elevated Sit to supine: Supervision   General bed mobility comments: increased time to complete    Transfers Overall transfer level: Needs assistance Equipment used: Rollator (4 wheels) Transfers: Sit to/from Stand Sit to Stand: Supervision           General transfer comment: Pt completed 2 STS from EOB, pushing up from  bed with BUE and then transferring hands to rollator. Pt demonstrating good understanding of mechanics of rollator, locking breaks before standing and sitting. Increased time to complete.    Ambulation/Gait Ambulation/Gait assistance: Contact guard assist Gait Distance (Feet): 125 Feet (x2) Assistive device: Rollator (4 wheels) Gait Pattern/deviations: Step-through pattern, Decreased stride length, Trunk flexed Gait velocity: reduced Gait velocity interpretation: <1.8 ft/sec, indicate of risk for recurrent falls   General Gait Details: Pt able to ambulate 125' before requesting seated rest break due to feeling fatigued; SpO2 reading 89% on 2L. Pt able to ambulate back to room and reports SOB upon sitting down with SpO2 reading 83% requiring increased oxygen  to 3L to maintain above 88%.   Stairs             Wheelchair Mobility     Tilt Bed    Modified Rankin (Stroke Patients Only)       Balance Overall balance assessment: Needs assistance Sitting-balance support: No upper extremity supported, Feet supported Sitting balance-Leahy Scale: Good Sitting balance - Comments: seated EOB   Standing balance support: Bilateral upper extremity supported, During functional activity, Reliant on assistive device for balance Standing balance-Leahy Scale: Poor Standing balance comment: reliant on external support for stability                            Communication Communication Communication: Impaired Factors Affecting Communication: Hearing impaired  Cognition Arousal: Alert Behavior During Therapy: WFL for tasks assessed/performed   PT - Cognitive impairments: No apparent impairments  Following commands: Intact      Cueing Cueing Techniques: Verbal cues  Exercises      General Comments        Pertinent Vitals/Pain Pain Assessment Pain Assessment: No/denies pain Pain Intervention(s): Monitored during session    Home  Living                          Prior Function            PT Goals (current goals can now be found in the care plan section) Acute Rehab PT Goals Patient Stated Goal: return home PT Goal Formulation: With patient Time For Goal Achievement: 03/27/24 Potential to Achieve Goals: Good Progress towards PT goals: Progressing toward goals    Frequency    Min 2X/week      PT Plan      Co-evaluation              AM-PAC PT 6 Clicks Mobility   Outcome Measure  Help needed turning from your back to your side while in a flat bed without using bedrails?: None Help needed moving from lying on your back to sitting on the side of a flat bed without using bedrails?: A Little Help needed moving to and from a bed to a chair (including a wheelchair)?: A Little Help needed standing up from a chair using your arms (e.g., wheelchair or bedside chair)?: A Little Help needed to walk in hospital room?: A Little Help needed climbing 3-5 steps with a railing? : Total 6 Click Score: 17    End of Session Equipment Utilized During Treatment: Gait belt;Oxygen  Activity Tolerance: Patient tolerated treatment well Patient left: in bed;with call bell/phone within reach Nurse Communication: Mobility status PT Visit Diagnosis: Unsteadiness on feet (R26.81);Other abnormalities of gait and mobility (R26.89);Muscle weakness (generalized) (M62.81)     Time: 9080-9048 PT Time Calculation (min) (ACUTE ONLY): 32 min  Charges:    $Therapeutic Activity: 23-37 mins PT General Charges $$ ACUTE PT VISIT: 1 Visit                     Leontine Hilt DPT Acute Rehab Services [phone] Prefer contact via chat    Leontine NOVAK Tanna Loeffler 03/16/2024, 10:44 AM

## 2024-03-17 DIAGNOSIS — J189 Pneumonia, unspecified organism: Secondary | ICD-10-CM | POA: Diagnosis not present

## 2024-03-17 DIAGNOSIS — T17908S Unspecified foreign body in respiratory tract, part unspecified causing other injury, sequela: Secondary | ICD-10-CM | POA: Diagnosis not present

## 2024-03-17 LAB — GLUCOSE, CAPILLARY
Glucose-Capillary: 119 mg/dL — ABNORMAL HIGH (ref 70–99)
Glucose-Capillary: 131 mg/dL — ABNORMAL HIGH (ref 70–99)
Glucose-Capillary: 210 mg/dL — ABNORMAL HIGH (ref 70–99)
Glucose-Capillary: 139 mg/dL — ABNORMAL HIGH (ref 70–99)

## 2024-03-17 LAB — CULTURE, BLOOD (ROUTINE X 2)
Culture: NO GROWTH
Culture: NO GROWTH
Special Requests: ADEQUATE

## 2024-03-17 LAB — BASIC METABOLIC PANEL WITH GFR
Anion gap: 7 (ref 5–15)
BUN: 15 mg/dL (ref 8–23)
CO2: 30 mmol/L (ref 22–32)
Calcium: 8.4 mg/dL — ABNORMAL LOW (ref 8.9–10.3)
Chloride: 95 mmol/L — ABNORMAL LOW (ref 98–111)
Creatinine, Ser: 0.46 mg/dL — ABNORMAL LOW (ref 0.61–1.24)
GFR, Estimated: >60 mL/min
Glucose, Bld: 120 mg/dL — ABNORMAL HIGH (ref 70–99)
Potassium: 3.9 mmol/L (ref 3.5–5.1)
Sodium: 133 mmol/L — ABNORMAL LOW (ref 135–145)

## 2024-03-17 LAB — CBC
HCT: 35.6 % — ABNORMAL LOW (ref 39.0–52.0)
Hemoglobin: 11 g/dL — ABNORMAL LOW (ref 13.0–17.0)
MCH: 29.6 pg (ref 26.0–34.0)
MCHC: 30.9 g/dL (ref 30.0–36.0)
MCV: 96 fL (ref 80.0–100.0)
Platelets: 259 K/uL (ref 150–400)
RBC: 3.71 MIL/uL — ABNORMAL LOW (ref 4.22–5.81)
RDW: 19.1 % — ABNORMAL HIGH (ref 11.5–15.5)
WBC: 8.7 K/uL (ref 4.0–10.5)
nRBC: 0.3 % — ABNORMAL HIGH (ref 0.0–0.2)

## 2024-03-17 MED ORDER — LACTULOSE 10 GM/15ML PO SOLN
20.0000 g | Freq: Three times a day (TID) | ORAL | Status: AC
  Administered 2024-03-17 (×3): 20 g via ORAL
  Filled 2024-03-17 (×3): qty 30

## 2024-03-17 NOTE — Progress Notes (Signed)
 OT Cancellation Note  Patient Details Name: Steven Guzman MRN: [id number] DOB: 1940-04-21   Cancelled Treatment:    Reason Eval/Treat Not Completed: Other (comment).  Attempted skilled OT treatment session with pt.  He was deferring mobility until a family member brings his cane for him.  Reviewed OT goals/tx options.  Pt. States that he feels confident with ADLs with his only limitations coming from not having the cane.  Pt. Receiving phone call and signaled that he needed to take the call.  OT team member to check back as able and attempt OT goals/ADLs with pts. Cane per his request.    CHRISTELLA Nest Lorraine-COTA/L  03/17/2024, 11:50 AM

## 2024-03-17 NOTE — Progress Notes (Signed)
 Mobility Specialist Progress Note:    03/17/24 1515  Mobility  Activity Ambulated with assistance  Level of Assistance Standby assist, set-up cues, supervision of patient - no hands on  Assistive Device Cane  Distance Ambulated (ft) 150 ft  Range of Motion/Exercises Active  Activity Response Tolerated fair  Mobility Referral Yes  Mobility visit 1 Mobility  Mobility Specialist Start Time (ACUTE ONLY) 1515  Mobility Specialist Stop Time (ACUTE ONLY) 1549  Mobility Specialist Time Calculation (min) (ACUTE ONLY) 34 min   Received pt laying in bed agreeable to session. Pt stating they have difficulty at times w/ catching breath but otherwise doing good. VSS stable. Pt able to stand w/ light CGA and ambulate on SV. Pt needing one seated break but performed exercised and PLB while sitting. Returned pt to room w/ all needs met.   Venetia Keel Mobility Specialist Please Neurosurgeon or Rehab Office at [phone]

## 2024-03-17 NOTE — Plan of Care (Signed)
  Problem: Education: Goal: Knowledge of General Education information will improve Description: Including pain rating scale, medication(s)/side effects and non-pharmacologic comfort measures Outcome: Progressing   Problem: Skin Integrity: Goal: Risk for impaired skin integrity will decrease Outcome: Progressing   Problem: Clinical Measurements: Goal: Will remain free from infection Outcome: Progressing

## 2024-03-17 NOTE — Progress Notes (Signed)
 " PROGRESS NOTE    Steven Guzman  FMW:[id number] DOB: 07/08/1940 DOA: 03/12/2024 PCP: Esmeralda Morton SAUNDERS, PA-C    Brief Narrative:  Steven Guzman is a 83 yr old man with HTN, COPD, chronic hypoxic respiratory failure, CAD, HFrEF, and VT with ICD presenting with pleuritic chest pain and SOB.  Patient reports a recent hospitalization, posthospitalization remain really weak, developed left-sided chest pain with shortness of breath overnight.  CTA is negative for PE,7.3 x 6.6 cm rounded ill-defined masslike consolidative opacity - pneumonia (vs neoplasm). 4.5 cm diameter ascending thoracic aortic aneurysm.  Slowly improving.  Will plan to discharge with home health and on oxygen .   Assessment and Plan: * Pneumonia Satting 100% on 3 L of oxygen -wean to room air as able (was discharged home 1 week ago on nasal cannula but was not on prior) - Continue antibiotics - incentive spirometer and flutter valve -obtain sputum cultures-- appears to be colonized - Continue steroids for now- wean to PO -SLP eval -will need follow up xray/scan to ensure resolution  Hyponatremia - Stable  Constipation -bowel regimen   Acute on chronic combined systolic and diastolic CHF (congestive heart failure) (HCC) - Stable - Resume home meds  Atypical chest pain Brief left-sided chest pain-resolved  Chronic idiopathic thrombocytopenia (HCC) Currently stable monitoring  Bilateral sensorineural hearing loss Noted  Degenerative cervical spinal stenosis - No current complaint, denies any numbness or tingling, acute on chronic generalized weaknesses -Will consult PT/OT for evaluation-Home health  B12 deficiency Resume supplements   Anemia Anemia of chronic disease with iron deficiency anemia -Continue ferrous sulfate , H&H stable  BPH (benign prostatic hyperplasia) Monitoring for urinary retention, currently no complaint, continue Flomax   AF (atrial fibrillation) (HCC) Stable -will continue  Coreg , mexiletine, amiodarone , and Eliquis   - Defibrillator in place  COPD GOLD GRADE C - Chronic condition,-compliant with oxygen  at baseline 3 L, satting 100% -Mild shortness of breath with no wheezing, initiated antibiotics, short burst of steroids - Encouraging incentive spirometer, flutter valve, mucolytics  ICD (implantable cardioverter-defibrillator) in place Assessed in place, no recent history of discharge -Patient reports no recent changes.  Stable  Hyperlipidemia - On Repatha   Essential hypertension -hypotensive, holding home heart medications - Currently listed Meds spironolactone , Lasix , Coreg , amiodarone  .. Monitoring BP closely, restarting above meds judiciously   CAD (coronary artery disease) Stable, reviewing home medication resuming according Denies any chest pain  History of abdominal aortic aneurysm (AAA) Noted-stable  Major depressive disorder with single episode, in partial remission Currently stable, continue home medication of Lexapro   GERD (gastroesophageal reflux disease) Continue PPI  Prediabetes Last A1c 5 months ago 5.8 -Due to steroids, anticipating hyperglycemia - SSI coverage     DVT prophylaxis: SCDs Start: 03/12/24 0808 apixaban  (ELIQUIS ) tablet 5 mg    Code Status: Full Code   Disposition Plan:  Level of care: Telemetry Status is: Inpatient     Consultants:  none   Subjective: Coughing up mucous but feeling better  Objective: Vitals:   03/16/24 2322 03/17/24 0302 03/17/24 0734 03/17/24 0839  BP: 123/82 118/74 106/69   Pulse: 86 77 79 84  Resp: 20 (!) 24 20 20   Temp: 97.9 F (36.6 C) 98 F (36.7 C) 98.1 F (36.7 C)   TempSrc: Oral Oral Oral   SpO2: 93% 95% 93% 92%  Weight:  65.1 kg    Height:        Intake/Output Summary (Last 24 hours) at 03/17/2024 0946 Last data filed at 03/17/2024 9178 Gross per  24 hour  Intake 360 ml  Output 5000 ml  Net -4640 ml   Filed Weights   03/15/24 0439 03/16/24 0349  03/17/24 0302  Weight: 65 kg 64.9 kg 65.1 kg    Examination:   General: Appearance:    Frail appearing male in no acute distress     Lungs:   On nasal cannula, appears more comfortable today  Heart:    Normal heart rate.   MS:   All extremities are intact.    Neurologic:   Awake, alert       Data Reviewed: I have personally reviewed following labs and imaging studies  CBC: Recent Labs  Lab 03/13/24 0326 03/14/24 0216 03/15/24 0239 03/16/24 0304 03/17/24 0318  WBC 16.3* 10.9* 8.3 8.1 8.7  HGB 9.8* 10.0* 9.9* 10.5* 11.0*  HCT 32.3* 33.0* 32.5* 33.9* 35.6*  MCV 97.6 96.5 95.6 95.8 96.0  PLT 162 183 206 244 259   Basic Metabolic Panel: Recent Labs  Lab 03/12/24 0811 03/13/24 0326 03/14/24 0216 03/14/24 1036 03/15/24 0239 03/16/24 0304 03/17/24 0318  NA  --  128* 132*  --  133* 132* 133*  K  --  4.1 4.3  --  4.1 4.1 3.9  CL  --  91* 94*  --  96* 96* 95*  CO2  --  27 29  --  30 31 30   GLUCOSE  --  103* 133*  --  111* 107* 120*  BUN  --  12 16  --  13 14 15   CREATININE  --  0.57* 0.63  --  0.45* 0.41* 0.46*  CALCIUM   --  8.3* 8.4*  --  8.3* 8.7* 8.4*  MG 1.8  --   --  2.2  --   --   --   PHOS 2.9  --   --   --   --   --   --    GFR: Estimated Creatinine Clearance: 62.2 mL/min (A) (by C-G formula based on SCr of 0.46 mg/dL (L)). Liver Function Tests: No results for input(s): AST, ALT, ALKPHOS, BILITOT, PROT, ALBUMIN in the last 168 hours. No results for input(s): LIPASE, AMYLASE in the last 168 hours. No results for input(s): AMMONIA in the last 168 hours. Coagulation Profile: Recent Labs  Lab 03/13/24 0326  INR 1.7*   Cardiac Enzymes: No results for input(s): CKTOTAL, CKMB, CKMBINDEX, TROPONINI in the last 168 hours. BNP (last 3 results) No results for input(s): PROBNP in the last 8760 hours. HbA1C: No results for input(s): HGBA1C in the last 72 hours.  CBG: Recent Labs  Lab 03/16/24 0617 03/16/24 1136 03/16/24 1544  03/16/24 2116 03/17/24 0613  GLUCAP 201* 150* 185* 141* 119*   Lipid Profile: No results for input(s): CHOL, HDL, LDLCALC, TRIG, CHOLHDL, LDLDIRECT in the last 72 hours. Thyroid  Function Tests: No results for input(s): TSH, T4TOTAL, FREET4, T3FREE, THYROIDAB in the last 72 hours. Anemia Panel: No results for input(s): VITAMINB12, FOLATE, FERRITIN, TIBC, IRON, RETICCTPCT in the last 72 hours. Sepsis Labs: Recent Labs  Lab 03/12/24 0811 03/13/24 0326  PROCALCITON <0.10  --   LATICACIDVEN  --  0.9    Recent Results (from the past 240 hours)  Culture, blood (routine x 2) Call MD if unable to obtain prior to antibiotics being given     Status: None   Collection Time: 03/12/24  8:11 AM   Specimen: BLOOD  Result Value Ref Range Status   Specimen Description BLOOD RIGHT ANTECUBITAL  Final   Special  Requests   Final    BOTTLES DRAWN AEROBIC AND ANAEROBIC Blood Culture results may not be optimal due to an inadequate volume of blood received in culture bottles   Culture   Final    NO GROWTH 5 DAYS Performed at [facility], [address] [place], [place] [address]    Report Status 03/17/2024 FINAL  Final  Culture, blood (routine x 2) Call MD if unable to obtain prior to antibiotics being given     Status: None   Collection Time: 03/12/24 10:59 AM   Specimen: BLOOD RIGHT ARM  Result Value Ref Range Status   Specimen Description BLOOD RIGHT ARM  Final   Special Requests   Final    BOTTLES DRAWN AEROBIC AND ANAEROBIC Blood Culture adequate volume   Culture   Final    NO GROWTH 5 DAYS Performed at [facility], [address], [place], [place] [address]    Report Status 03/17/2024 FINAL  Final  Expectorated Sputum Assessment w Gram Stain, Rflx to Resp Cult     Status: None   Collection Time: 03/14/24  8:12 AM   Specimen: Expectorated Sputum  Result Value Ref Range Status   Specimen Description EXPECTORATED SPUTUM  Final   Special  Requests NONE  Final   Sputum evaluation   Final    THIS SPECIMEN IS ACCEPTABLE FOR SPUTUM CULTURE Performed at [facility], [address] [place], [place] [address]    Report Status 03/15/2024 FINAL  Final  Culture, Respiratory w Gram Stain     Status: None (Preliminary result)   Collection Time: 03/14/24  8:12 AM  Result Value Ref Range Status   Specimen Description EXPECTORATED SPUTUM  Final   Special Requests NONE Reflexed from [id number]  Final   Gram Stain   Final    FEW WBC PRESENT, PREDOMINANTLY PMN RARE GRAM NEGATIVE RODS RARE GRAM POSITIVE RODS    Culture   Final    RARE PSEUDOMONAS AERUGINOSA RARE KLEBSIELLA AEROGENES CULTURE REINCUBATED FOR BETTER GROWTH Performed at [facility], [address], [place], [place] [address]    Report Status PENDING  Incomplete         Radiology Studies: DG Swallowing Func-Speech Pathology Result Date: 03/15/2024 Table formatting from the original result was not included. Modified Barium Swallow Study Patient Details Name: Steven Guzman MRN: [id number] Date of Birth: 1940-12-29 Today's Date: 03/15/2024 HPI/PMH: HPI: 83 yo male presenting to ED 12/14 with pleuritic chest pain and SOB. Admitted with COPD exacerbation and pneumonia. CTA shows rounded ill-defined mass-like consolidative opacity, compatible with pneumonia with additional ill-defined nodular opacities identified in the RLL with evidence of peripheral small airway impaction and a moderate hiatal hernia. Seen by SLP 03/04/24 with functional appearing swallowing and for dysphonia 08/03/22. Stroboscopy 08/19/22 shows mild diffuse erythema, minimal posterior laryngeal edema, preserved vocal fold mobility with midfold atrophy, resulting in incomplete glottic closure. ENT confirms the presence of tonsilloliths, which are bothersome to the pt but that he does not wish to remove. PMH: COPD, CHF, CAD, GERD, MI, aortic aneurysm, ACDF, chronic neck/back pain, anemia, depression,  idiopathic thrombocytopenia Clinical Impression: Pt exhibits moderate pharyngeal dysphagia characterized by mistiming, which may be a result of effort required to coordinate breathing and swallowing. Aspiration occurs when taking large, successive straw sips of thin liquids and while it is sensed, his cough cannot expel the bolus completely (PAS 7). This appears to happen as the bolus reaches the pyriform sinuses before  the swallow is initiated and spills over into the larynx before he achieves complete closure. His second line of defense is ineffective secondary to known vocal fold atrophy with incomplete glottic closure. No aspiration occurs with cup sips of thin liquids and trace penetrates are ejected with a cued cough (PAS 4). While aspiration was also prevented with use of thickened liquids, suspect increased WOB has the potential to affect timing and coordination throughout the course of a meal and if aspirated, thickened liquids are thought to cause more harm to his pulmonary health. Discussed factors that are within pt's control to minimize the risk of adverse events in the presence of aspiration, including oral care BID in addition to before PO intake and mobility. Pt wishes to continue regular diet with thin liquids via cup only and meds whole in puree. Will f/u for ongoing education. Recommend he continue dysphagia interventions with HH or OP SLP. Factors that may increase risk of adverse event in presence of aspiration Noe & Lianne 2021): Factors that may increase risk of adverse event in presence of aspiration Noe & Lianne 2021): Respiratory or GI disease; Inadequate oral hygiene Recommendations/Plan: Swallowing Evaluation Recommendations Swallowing Evaluation Recommendations Recommendations: PO diet PO Diet Recommendation: Regular; Thin liquids (Level 0) Liquid Administration via: Cup; No straw Medication Administration: Whole meds with puree Supervision: Patient able to self-feed;  Intermittent supervision/cueing for swallowing strategies Swallowing strategies  : Slow rate; Small bites/sips; Hard cough after swallowing Postural changes: Position pt fully upright for meals; Stay upright 30-60 min after meals Oral care recommendations: Oral care BID (2x/day); Oral care before PO Treatment Plan Treatment Plan Treatment recommendations: Therapy as outlined in treatment plan below Follow-up recommendations: Home health SLP Functional status assessment: Patient has had a recent decline in their functional status and demonstrates the ability to make significant improvements in function in a reasonable and predictable amount of time. Treatment frequency: Min 2x/week Treatment duration: 2 weeks Interventions: Aspiration precaution training; Compensatory techniques; Patient/family education Recommendations Recommendations for follow up therapy are one component of a multi-disciplinary discharge planning process, led by the attending physician.  Recommendations may be updated based on patient status, additional functional criteria and insurance authorization. Assessment: Orofacial Exam: Orofacial Exam Oral Cavity: Oral Hygiene: WFL Oral Cavity - Dentition: Missing dentition; Dentures, top Orofacial Anatomy: WFL Oral Motor/Sensory Function: WFL Anatomy: Anatomy: Suspected cervical osteophytes; Presence of cervical hardware; Prominent cricopharyngeus Boluses Administered: Boluses Administered Boluses Administered: Thin liquids (Level 0); Mildly thick liquids (Level 2, nectar thick); Moderately thick liquids (Level 3, honey thick); Puree; Solid  Oral Impairment Domain: Oral Impairment Domain Lip Closure: No labial escape Tongue control during bolus hold: Posterior escape of less than half of bolus Bolus preparation/mastication: Timely and efficient chewing and mashing Bolus transport/lingual motion: Brisk tongue motion Oral residue: Complete oral clearance Location of oral residue : N/A Initiation of  pharyngeal swallow : Pyriform sinuses  Pharyngeal Impairment Domain: Pharyngeal Impairment Domain Soft palate elevation: No bolus between soft palate (SP)/pharyngeal wall (PW) Laryngeal elevation: Complete superior movement of thyroid  cartilage with complete approximation of arytenoids to epiglottic petiole Anterior hyoid excursion: Complete anterior movement Epiglottic movement: Complete inversion Laryngeal vestibule closure: Complete, no air/contrast in laryngeal vestibule Pharyngeal stripping wave : Present - complete Pharyngeal contraction (A/P view only): N/A Pharyngoesophageal segment opening: Partial distention/partial duration, partial obstruction of flow Tongue base retraction: Trace column of contrast or air between tongue base and PPW Pharyngeal residue: Collection of residue within or on pharyngeal structures Location of pharyngeal residue: Tongue base;  Valleculae  Esophageal Impairment Domain: Esophageal Impairment Domain Esophageal clearance upright position: Esophageal retention Pill: Pill Consistency administered: Thin liquids (Level 0) Thin liquids (Level 0): Impaired (see clinical impressions) Penetration/Aspiration Scale Score: Penetration/Aspiration Scale Score 1.  Material does not enter airway: Moderately thick liquids (Level 3, honey thick); Puree; Solid; Pill 3.  Material enters airway, remains ABOVE vocal cords and not ejected out: Mildly thick liquids (Level 2, nectar thick) 7.  Material enters airway, passes BELOW cords and not ejected out despite cough attempt by patient: Thin liquids (Level 0) Compensatory Strategies: Compensatory Strategies Compensatory strategies: No   General Information: Caregiver present: No  Diet Prior to this Study: Regular; Thin liquids (Level 0)   Temperature : Normal   Respiratory Status: Increased WOB   Supplemental O2: Nasal cannula   History of Recent Intubation: No  Behavior/Cognition: Alert; Cooperative; Pleasant mood Self-Feeding Abilities: Able to  self-feed Baseline vocal quality/speech: Dysphonic Volitional Cough: Able to elicit Volitional Swallow: Able to elicit Exam Limitations: No limitations Goal Planning: Prognosis for improved oropharyngeal function: Good Barriers to Reach Goals: Time post onset No data recorded Patient/Family Stated Goal: none stated Consulted and agree with results and recommendations: Patient; Nurse; Physician Pain: Pain Assessment Pain Assessment: No/denies pain Pain Intervention(s): Monitored during session End of Session: Start Time:SLP Start Time (ACUTE ONLY): 1252 Stop Time: SLP Stop Time (ACUTE ONLY): 1330 Time Calculation:SLP Time Calculation (min) (ACUTE ONLY): 38 min Charges: SLP Evaluations $ SLP Speech Visit: 1 Visit SLP Evaluations $BSS Swallow: 1 Procedure $MBS Swallow: 1 Procedure SLP visit diagnosis: SLP Visit Diagnosis: Dysphagia, pharyngeal phase (R13.13) Past Medical History: Past Medical History: Diagnosis Date  AICD (automatic cardioverter/defibrillator) present   [employer] MOMENTUM EL ICD D121/ [phone]  Aneurysm   a. Aneurysmal infrarenal aorta up to 33 mm on CT 10/2014, recommended f/u due 10/2017  Anginal pain   Anxiety   Arthritis   right foot  Basal cell carcinoma of nose   S/P MOHS  Biliary acute pancreatitis   CAD (coronary artery disease)   a. s/p MI in 1994 with PCI to LAD at that time b. cath 10/2012 demonstrated EF 30%, inferior akinesis with mild hypokinesis of all walls, patent LAD and RCA stents; ostial PDA with 80-90% obstruction with medical therapy recommended   Chronic systolic CHF (congestive heart failure) (HCC)   EF 30 to 35 % as of 09/2014.   CKD (chronic kidney disease), stage III (HCC)   Complication of anesthesia 10/2014  had to have defibrillator w/ERCP- CODED after having gallstones removed  COPD (chronic obstructive pulmonary disease) (HCC)   a. followed by pulmonary, COPD GOLD stage II  Depression   Diverticulosis of colon 07/2014  noted on CT  Dyspnea   with exertion  GERD  (gastroesophageal reflux disease)   Hiatal hernia   large  History of kidney stones   passed stone  Hyperglycemia 10/2012  Hyperlipidemia   Hypertension   Myocardial infarction [facility]) 1994; 2011  Pneumonia 1946; 2015  Prostate enlargement 07/2014  observed on CT  Tobacco abuse   Ventricular tachycardia (HCC)   a. 08/2009 s/p BSX E110 Teligen 100 AICD, ser#: [id number];  b. 08/2008 VT req ATP - detection reprogrammed from 160 to 150. c. EPS and VT ablation by Dr. Waddell 12/21/2014 Past Surgical History: Past Surgical History: Procedure Laterality Date  BIOPSY  12/21/2017  Procedure: BIOPSY;  Surgeon: Abran Norleen SAILOR, MD;  Location: [facility];  Service: Endoscopy;;  CATARACT EXTRACTION W/ INTRAOCULAR LENS  IMPLANT, BILATERAL Bilateral ~  2011  COLONOSCOPY    COLONOSCOPY WITH PROPOFOL  N/A 12/21/2017  Procedure: COLONOSCOPY WITH PROPOFOL ;  Surgeon: Abran Norleen SAILOR, MD;  Location: [facility];  Service: Endoscopy;  Laterality: N/A;  ELECTROPHYSIOLOGIC STUDY N/A 12/21/2014  Procedure: V Tach Ablation;  Surgeon: Danelle LELON Birmingham, MD;  Location: [facility]VAS[facility] CV LAB;  Service: Cardiovascular;  Laterality: N/A;  ERCP N/A 11/16/2014  Procedure: ENDOSCOPIC RETROGRADE CHOLANGIOPANCREATOGRAPHY (ERCP);  Surgeon: Lamar JONETTA Aho, MD;  Location: [facility];  Service: Endoscopy;  Laterality: N/A;  ESOPHAGOGASTRODUODENOSCOPY (EGD) WITH PROPOFOL  N/A 12/21/2017  Procedure: ESOPHAGOGASTRODUODENOSCOPY (EGD) WITH PROPOFOL ;  Surgeon: Abran Norleen SAILOR, MD;  Location: [facility];  Service: Endoscopy;  Laterality: N/A;  EYE SURGERY    FOOT SURGERY Left 2005  fixed bone that stuck out in my ankle area  HEMORRHOID BANDING    ICD GENERATOR CHANGEOUT N/A 04/20/2022  Procedure: ICD GENERATOR CHANGEOUT;  Surgeon: Birmingham Danelle LELON, MD;  Location[facility] INVASIVE CV LAB;  Service: Cardiovascular;  Laterality: N/A;  IMPLANTABLE CARDIOVERTER DEFIBRILLATOR IMPLANT  09/06/09  BSX dual chamber ICD implanted in [place]  for cardiac arrest 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  INGUINAL  HERNIA REPAIR Right ~ 1995  INGUINAL HERNIA REPAIR Left 04/10/2022  Procedure: HERNIA REPAIR INGUINAL ADULT;  Surgeon: Ebbie Cough, MD;  Location: [facility] OR;  Service: General;  Laterality: Left;  LEFT HEART CATH AND CORONARY ANGIOGRAPHY N/A 03/19/2022  Procedure: LEFT HEART CATH AND CORONARY ANGIOGRAPHY;  Surgeon: Jordan, Peter M, MD;  Location: [facility] INVASIVE CV LAB;  Service: Cardiovascular;  Laterality: N/A;  LEFT HEART CATHETERIZATION WITH CORONARY ANGIOGRAM N/A 11/25/2012  demonstrated EF 30%, inferior akinesis with mild hypokinesis of all walls, patent LAD and RCA stents; ostial PDA with 80-90% obstruction with medical therapy recommended  MOHS SURGERY  2008  nose, skin graft  POLYPECTOMY  12/21/2017  Procedure: POLYPECTOMY;  Surgeon: Abran Norleen SAILOR, MD;  Location: [facility];  Service: Endoscopy;;  RETINAL DETACHMENT SURGERY Right 2013  TENOLYSIS Right 12/21/2013  Procedure: TENOLYSIS FLEXOR CARPI RADIALIS ,DEBRIDEMENT RIGHT JOINT WRIST,DEBRIDEMENT SCAPHOTRAPEZIAL TRAPEZOID, REPAIR OF EXTENSOR HOOD;  Surgeon: Arley Curia, MD;  Location: [facility];  Service: Orthopedics;  Laterality: Right;  TOE SURGERY Right 09/2019  3rd toe/hammer toe  V-TACH ABLATION  12/21/2014  VIDEO BRONCHOSCOPY Bilateral 01/09/2016  Procedure: VIDEO BRONCHOSCOPY WITHOUT FLUORO;  Surgeon: Vicenta KATHEE Lennert, MD;  Location: [facility];  Service: Cardiopulmonary;  Laterality: Bilateral; [name], M.A., CCC-SLP Speech Language Pathology, Acute Rehabilitation Services Secure Chat preferred [phone] 03/15/2024, 2:12 PM        Scheduled Meds:  acidophilus  1 capsule Oral BID   amiodarone   200 mg Oral Daily   apixaban   5 mg Oral BID   arformoterol   15 mcg Nebulization BID   And   umeclidinium bromide   1 puff Inhalation Daily   aspirin  EC  81 mg Oral Daily   carvedilol   3.125 mg Oral BID   doxycycline   100 mg Oral Q12H   empagliflozin   10 mg Oral Daily   escitalopram   20 mg Oral Daily   feeding supplement   237 mL Oral BID BM   ferrous sulfate    Oral Daily   furosemide   40 mg Oral Daily   guaiFENesin   1,200 mg Oral Q8H   insulin  aspart  0-9 Units Subcutaneous TID WC   lactulose   20 g Oral TID   lidocaine   1 patch Transdermal Q24H   magnesium  oxide  400 mg Oral Daily   melatonin  3 mg Oral QHS  mexiletine  200 mg Oral BID   nystatin   5 mL Oral QID   pantoprazole   40 mg Oral BID   predniSONE   40 mg Oral Q breakfast   pregabalin   25 mg Oral BID   senna-docusate  1 tablet Oral BID   sodium chloride  flush  3 mL Intravenous Q12H   sodium chloride  flush  3 mL Intravenous Q12H   tamsulosin   0.4 mg Oral Daily   Continuous Infusions:  ampicillin -sulbactam (UNASYN ) IV 3 g (03/17/24 0624)     LOS: 5 days    Time spent: 45 minutes spent on chart review, discussion with nursing staff, consultants, updating family and interview/physical exam; more than 50% of that time was spent in counseling and/or coordination of care.    Harlene RAYMOND Bowl, DO [facility] Available via Epic secure chat 7am-7pm After these hours, please refer to coverage provider listed on [email or web address] 03/17/2024, 9:46 AM   "

## 2024-03-18 ENCOUNTER — Inpatient Hospital Stay (HOSPITAL_COMMUNITY)

## 2024-03-18 DIAGNOSIS — J441 Chronic obstructive pulmonary disease with (acute) exacerbation: Secondary | ICD-10-CM | POA: Diagnosis not present

## 2024-03-18 DIAGNOSIS — T17908S Unspecified foreign body in respiratory tract, part unspecified causing other injury, sequela: Secondary | ICD-10-CM | POA: Diagnosis not present

## 2024-03-18 DIAGNOSIS — R918 Other nonspecific abnormal finding of lung field: Secondary | ICD-10-CM | POA: Diagnosis not present

## 2024-03-18 DIAGNOSIS — J189 Pneumonia, unspecified organism: Secondary | ICD-10-CM | POA: Diagnosis not present

## 2024-03-18 LAB — BASIC METABOLIC PANEL WITH GFR
Anion gap: 7 (ref 5–15)
BUN: 12 mg/dL (ref 8–23)
CO2: 33 mmol/L — ABNORMAL HIGH (ref 22–32)
Calcium: 8.2 mg/dL — ABNORMAL LOW (ref 8.9–10.3)
Chloride: 95 mmol/L — ABNORMAL LOW (ref 98–111)
Creatinine, Ser: 0.49 mg/dL — ABNORMAL LOW (ref 0.61–1.24)
GFR, Estimated: >60 mL/min
Glucose, Bld: 139 mg/dL — ABNORMAL HIGH (ref 70–99)
Potassium: 3.5 mmol/L (ref 3.5–5.1)
Sodium: 134 mmol/L — ABNORMAL LOW (ref 135–145)

## 2024-03-18 LAB — CULTURE, RESPIRATORY W GRAM STAIN

## 2024-03-18 LAB — CBC
HCT: 34.7 % — ABNORMAL LOW (ref 39.0–52.0)
Hemoglobin: 10.6 g/dL — ABNORMAL LOW (ref 13.0–17.0)
MCH: 29.1 pg (ref 26.0–34.0)
MCHC: 30.5 g/dL (ref 30.0–36.0)
MCV: 95.3 fL (ref 80.0–100.0)
Platelets: 257 K/uL (ref 150–400)
RBC: 3.64 MIL/uL — ABNORMAL LOW (ref 4.22–5.81)
RDW: 19 % — ABNORMAL HIGH (ref 11.5–15.5)
WBC: 7.2 K/uL (ref 4.0–10.5)
nRBC: 0.3 % — ABNORMAL HIGH (ref 0.0–0.2)

## 2024-03-18 LAB — GLUCOSE, CAPILLARY
Glucose-Capillary: 127 mg/dL — ABNORMAL HIGH (ref 70–99)
Glucose-Capillary: 160 mg/dL — ABNORMAL HIGH (ref 70–99)
Glucose-Capillary: 150 mg/dL — ABNORMAL HIGH (ref 70–99)
Glucose-Capillary: 151 mg/dL — ABNORMAL HIGH (ref 70–99)

## 2024-03-18 NOTE — Plan of Care (Signed)
" °  Problem: Coping: Goal: Ability to adjust to condition or change in health will improve Outcome: Progressing   Problem: Fluid Volume: Goal: Ability to maintain a balanced intake and output will improve Outcome: Progressing   Problem: Education: Goal: Ability to describe self-care measures that may prevent or decrease complications (Diabetes Survival Skills Education) will improve Outcome: Progressing   Problem: Metabolic: Goal: Ability to maintain appropriate glucose levels will improve Outcome: Progressing   Problem: Skin Integrity: Goal: Risk for impaired skin integrity will decrease Outcome: Progressing   Problem: Tissue Perfusion: Goal: Adequacy of tissue perfusion will improve Outcome: Progressing   "

## 2024-03-18 NOTE — Progress Notes (Addendum)
 " PROGRESS NOTE    Steven Guzman  FMW:[id number] DOB: April 15, 1940 DOA: 03/12/2024 PCP: Esmeralda Morton SAUNDERS, PA-C    Brief Narrative:  Steven Guzman is a 83 yr old man with HTN, COPD, chronic hypoxic respiratory failure, CAD, HFrEF, and VT with ICD presenting with pleuritic chest pain and SOB.  Patient reports a recent hospitalization, posthospitalization remain really weak, developed left-sided chest pain with shortness of breath overnight.  CTA is negative for PE,7.3 x 6.6 cm rounded ill-defined masslike consolidative opacity - pneumonia (vs neoplasm). 4.5 cm diameter ascending thoracic aortic aneurysm.  Was slowly improving until 12/21 patient states he has  worsening breathing and weakness.  He does not feel like he can go home safely with home health.  Have repeated x-ray and asked PT to reevaluate patient   Assessment and Plan: * Pneumonia Satting 100% on 3 L of oxygen -wean to room air as able (was discharged home 1 week ago on nasal cannula but was not on prior) - Continue antibiotics-stop date placed - incentive spirometer and flutter valve - sputum cultures-- appears to be colonized as culture has rare Pseudomonas and Klebsiella - Continue steroids for now- wean to PO -SLP eval-advised not to use straw - Plan to repeat x-ray on 12/20 due to worsening shortness of breath - Will need x-ray/imaging outpatient to ensure resolution  Hyponatremia - Stable  Constipation -bowel regimen -positive bowel movement 12/19   Acute on chronic combined systolic and diastolic CHF (congestive heart failure) (HCC) - Stable - Resume home meds  Atypical chest pain Brief left-sided chest pain-resolved  Chronic idiopathic thrombocytopenia (HCC) Currently stable monitoring  Bilateral sensorineural hearing loss Noted  Degenerative cervical spinal stenosis - No current complaint, denies any numbness or tingling, acute on chronic generalized weaknesses -Will consult PT/OT for  evaluation-Home health  B12 deficiency Resume supplements   Anemia Anemia of chronic disease with iron deficiency anemia -Continue ferrous sulfate , H&H stable  BPH (benign prostatic hyperplasia) Monitoring for urinary retention, currently no complaint, continue Flomax   AF (atrial fibrillation) (HCC) Stable -will continue Coreg , mexiletine, amiodarone , and Eliquis   - Defibrillator in place  COPD GOLD GRADE C - Chronic condition,-compliant with oxygen  at baseline 3 L, satting 100% -Mild shortness of breath with no wheezing, initiated antibiotics, short burst of steroids - Encouraging incentive spirometer, flutter valve, mucolytics  ICD (implantable cardioverter-defibrillator) in place Assessed in place, no recent history of discharge -Patient reports no recent changes.  Stable  Hyperlipidemia - On Repatha   Essential hypertension -hypotensive, holding home heart medications - Currently listed Meds spironolactone , Lasix , Coreg , amiodarone  .. Monitoring BP closely, restarting above meds judiciously   CAD (coronary artery disease) Stable, reviewing home medication resuming according Denies any chest pain  History of abdominal aortic aneurysm (AAA) Noted-stable  Major depressive disorder with single episode, in partial remission Currently stable, continue home medication of Lexapro   GERD (gastroesophageal reflux disease) Continue PPI  Prediabetes Last A1c 5 months ago 5.8 -Due to steroids, anticipating hyperglycemia - SSI coverage  Home in 24 to 48 hours unless PT recommends skilled nursing facility   DVT prophylaxis: SCDs Start: 03/12/24 0808 apixaban  (ELIQUIS ) tablet 5 mg    Code Status: Full Code   Disposition Plan:  Level of care: Telemetry Status is: Inpatient     Consultants:  none   Subjective: Says he does not feel safe to return home with home health as he is unable to complete his ADLs  Objective: Vitals:   03/18/24 0633 03/18/24 0842  03/18/24  0919 03/18/24 1142  BP:   96/61 (!) 92/54  Pulse:  78 80 100  Resp:  19 20 18   Temp:    97.6 F (36.4 C)  TempSrc:    Oral  SpO2:  98%  95%  Weight: 66.7 kg     Height:        Intake/Output Summary (Last 24 hours) at 03/18/2024 1347 Last data filed at 03/18/2024 9157 Gross per 24 hour  Intake 1120 ml  Output 2750 ml  Net -1630 ml   Filed Weights   03/16/24 0349 03/17/24 0302 03/18/24 0633  Weight: 64.9 kg 65.1 kg 66.7 kg    Examination:   General: Appearance:    Frail appearing male in no acute distress     Lungs:   On nasal cannula, diminished but no wheezing  Heart:    Tachycardic.   MS:   All extremities are intact.    Neurologic:   Awake, alert       Data Reviewed: I have personally reviewed following labs and imaging studies  CBC: Recent Labs  Lab 03/14/24 0216 03/15/24 0239 03/16/24 0304 03/17/24 0318 03/18/24 0303  WBC 10.9* 8.3 8.1 8.7 7.2  HGB 10.0* 9.9* 10.5* 11.0* 10.6*  HCT 33.0* 32.5* 33.9* 35.6* 34.7*  MCV 96.5 95.6 95.8 96.0 95.3  PLT 183 206 244 259 257   Basic Metabolic Panel: Recent Labs  Lab 03/12/24 0811 03/13/24 0326 03/14/24 0216 03/14/24 1036 03/15/24 0239 03/16/24 0304 03/17/24 0318 03/18/24 0303  NA  --    < > 132*  --  133* 132* 133* 134*  K  --    < > 4.3  --  4.1 4.1 3.9 3.5  CL  --    < > 94*  --  96* 96* 95* 95*  CO2  --    < > 29  --  30 31 30  33*  GLUCOSE  --    < > 133*  --  111* 107* 120* 139*  BUN  --    < > 16  --  13 14 15 12   CREATININE  --    < > 0.63  --  0.45* 0.41* 0.46* 0.49*  CALCIUM   --    < > 8.4*  --  8.3* 8.7* 8.4* 8.2*  MG 1.8  --   --  2.2  --   --   --   --   PHOS 2.9  --   --   --   --   --   --   --    < > = values in this interval not displayed.   GFR: Estimated Creatinine Clearance: 63.7 mL/min (A) (by C-G formula based on SCr of 0.49 mg/dL (L)). Liver Function Tests: No results for input(s): AST, ALT, ALKPHOS, BILITOT, PROT, ALBUMIN in the last 168 hours. No  results for input(s): LIPASE, AMYLASE in the last 168 hours. No results for input(s): AMMONIA in the last 168 hours. Coagulation Profile: Recent Labs  Lab 03/13/24 0326  INR 1.7*   Cardiac Enzymes: No results for input(s): CKTOTAL, CKMB, CKMBINDEX, TROPONINI in the last 168 hours. BNP (last 3 results) No results for input(s): PROBNP in the last 8760 hours. HbA1C: No results for input(s): HGBA1C in the last 72 hours.  CBG: Recent Labs  Lab 03/17/24 1052 03/17/24 1644 03/17/24 2117 03/18/24 0610 03/18/24 1111  GLUCAP 131* 210* 139* 127* 160*   Lipid Profile: No results for input(s): CHOL, HDL, LDLCALC, TRIG, CHOLHDL, LDLDIRECT in  the last 72 hours. Thyroid  Function Tests: No results for input(s): TSH, T4TOTAL, FREET4, T3FREE, THYROIDAB in the last 72 hours. Anemia Panel: No results for input(s): VITAMINB12, FOLATE, FERRITIN, TIBC, IRON, RETICCTPCT in the last 72 hours. Sepsis Labs: Recent Labs  Lab 03/12/24 0811 03/13/24 0326  PROCALCITON <0.10  --   LATICACIDVEN  --  0.9    Recent Results (from the past 240 hours)  Culture, blood (routine x 2) Call MD if unable to obtain prior to antibiotics being given     Status: None   Collection Time: 03/12/24  8:11 AM   Specimen: BLOOD  Result Value Ref Range Status   Specimen Description BLOOD RIGHT ANTECUBITAL  Final   Special Requests   Final    BOTTLES DRAWN AEROBIC AND ANAEROBIC Blood Culture results may not be optimal due to an inadequate volume of blood received in culture bottles   Culture   Final    NO GROWTH 5 DAYS Performed at [facility], [address], [place], [place] [address]    Report Status 03/17/2024 FINAL  Final  Culture, blood (routine x 2) Call MD if unable to obtain prior to antibiotics being given     Status: None   Collection Time: 03/12/24 10:59 AM   Specimen: BLOOD RIGHT ARM  Result Value Ref Range Status   Specimen Description BLOOD  RIGHT ARM  Final   Special Requests   Final    BOTTLES DRAWN AEROBIC AND ANAEROBIC Blood Culture adequate volume   Culture   Final    NO GROWTH 5 DAYS Performed at [facility], [address], [place], [place] [address]    Report Status 03/17/2024 FINAL  Final  Expectorated Sputum Assessment w Gram Stain, Rflx to Resp Cult     Status: None   Collection Time: 03/14/24  8:12 AM   Specimen: Expectorated Sputum  Result Value Ref Range Status   Specimen Description EXPECTORATED SPUTUM  Final   Special Requests NONE  Final   Sputum evaluation   Final    THIS SPECIMEN IS ACCEPTABLE FOR SPUTUM CULTURE Performed at [facility], [address], [place], [place] [address]    Report Status 03/15/2024 FINAL  Final  Culture, Respiratory w Gram Stain     Status: None   Collection Time: 03/14/24  8:12 AM  Result Value Ref Range Status   Specimen Description EXPECTORATED SPUTUM  Final   Special Requests NONE Reflexed from [id number]  Final   Gram Stain   Final    FEW WBC PRESENT, PREDOMINANTLY PMN RARE GRAM NEGATIVE RODS RARE GRAM POSITIVE RODS Performed at [facility], [address], [place], [place] [address]    Culture   Final    RARE PSEUDOMONAS AERUGINOSA RARE KLEBSIELLA AEROGENES    Report Status 03/18/2024 FINAL  Final   Organism ID, Bacteria PSEUDOMONAS AERUGINOSA  Final   Organism ID, Bacteria KLEBSIELLA AEROGENES  Final      Susceptibility   Klebsiella aerogenes - MIC*    CEFEPIME <=0.12 SENSITIVE Sensitive     ERTAPENEM <=0.12 SENSITIVE Sensitive     CEFTRIAXONE <=0.25 SENSITIVE Sensitive     CIPROFLOXACIN <=0.06 SENSITIVE Sensitive     GENTAMICIN  <=1 SENSITIVE Sensitive     MEROPENEM <=0.25 SENSITIVE Sensitive     TRIMETH/SULFA <=20 SENSITIVE Sensitive     PIP/TAZO Value in next row Sensitive      <=4 SENSITIVEThis is a modified FDA-approved test that has been validated  and its performance characteristics determined by the reporting laboratory.  This  laboratory is certified under the Clinical Laboratory Improvement Amendments CLIA as qualified to perform high complexity clinical laboratory testing.    * RARE KLEBSIELLA AEROGENES   Pseudomonas aeruginosa - MIC*    MEROPENEM Value in next row Sensitive      <=4 SENSITIVEThis is a modified FDA-approved test that has been validated and its performance characteristics determined by the reporting laboratory.  This laboratory is certified under the Clinical Laboratory Improvement Amendments CLIA as qualified to perform high complexity clinical laboratory testing.    CIPROFLOXACIN Value in next row Sensitive      <=4 SENSITIVEThis is a modified FDA-approved test that has been validated and its performance characteristics determined by the reporting laboratory.  This laboratory is certified under the Clinical Laboratory Improvement Amendments CLIA as qualified to perform high complexity clinical laboratory testing.    IMIPENEM Value in next row Sensitive      <=4 SENSITIVEThis is a modified FDA-approved test that has been validated and its performance characteristics determined by the reporting laboratory.  This laboratory is certified under the Clinical Laboratory Improvement Amendments CLIA as qualified to perform high complexity clinical laboratory testing.    PIP/TAZO Value in next row Sensitive      8 SENSITIVEThis is a modified FDA-approved test that has been validated and its performance characteristics determined by the reporting laboratory.  This laboratory is certified under the Clinical Laboratory Improvement Amendments CLIA as qualified to perform high complexity clinical laboratory testing.    CEFEPIME Value in next row Sensitive      8 SENSITIVEThis is a modified FDA-approved test that has been validated and its performance characteristics determined by the reporting laboratory.  This laboratory is certified under the Clinical Laboratory Improvement Amendments CLIA as qualified to perform high  complexity clinical laboratory testing.    CEFTAZIDIME/AVIBACTAM Value in next row Sensitive      8 SENSITIVEThis is a modified FDA-approved test that has been validated and its performance characteristics determined by the reporting laboratory.  This laboratory is certified under the Clinical Laboratory Improvement Amendments CLIA as qualified to perform high complexity clinical laboratory testing.    CEFTOLOZANE/TAZOBACTAM Value in next row Sensitive      8 SENSITIVEThis is a modified FDA-approved test that has been validated and its performance characteristics determined by the reporting laboratory.  This laboratory is certified under the Clinical Laboratory Improvement Amendments CLIA as qualified to perform high complexity clinical laboratory testing.    TOBRAMYCIN Value in next row Sensitive      8 SENSITIVEThis is a modified FDA-approved test that has been validated and its performance characteristics determined by the reporting laboratory.  This laboratory is certified under the Clinical Laboratory Improvement Amendments CLIA as qualified to perform high complexity clinical laboratory testing.    CEFTAZIDIME Value in next row Sensitive      8 SENSITIVEThis is a modified FDA-approved test that has been validated and its performance characteristics determined by the reporting laboratory.  This laboratory is certified under the Clinical Laboratory Improvement Amendments CLIA as qualified to perform high complexity clinical laboratory testing.    * RARE PSEUDOMONAS AERUGINOSA         Radiology Studies: No results found.        Scheduled Meds:  acidophilus  1 capsule Oral BID   amiodarone   200 mg Oral Daily   apixaban   5 mg Oral BID   arformoterol   15 mcg Nebulization BID  And   umeclidinium bromide   1 puff Inhalation Daily   aspirin  EC  81 mg Oral Daily   carvedilol   3.125 mg Oral BID   doxycycline   100 mg Oral Q12H   empagliflozin   10 mg Oral Daily   escitalopram   20 mg Oral  Daily   feeding supplement  237 mL Oral BID BM   ferrous sulfate    Oral Daily   furosemide   40 mg Oral Daily   guaiFENesin   1,200 mg Oral Q8H   insulin  aspart  0-9 Units Subcutaneous TID WC   lidocaine   1 patch Transdermal Q24H   magnesium  oxide  400 mg Oral Daily   melatonin  3 mg Oral QHS   mexiletine  200 mg Oral BID   nystatin   5 mL Oral QID   pantoprazole   40 mg Oral BID   predniSONE   40 mg Oral Q breakfast   pregabalin   25 mg Oral BID   senna-docusate  1 tablet Oral BID   sodium chloride  flush  3 mL Intravenous Q12H   sodium chloride  flush  3 mL Intravenous Q12H   tamsulosin   0.4 mg Oral Daily   Continuous Infusions:  ampicillin -sulbactam (UNASYN ) IV 3 g (03/18/24 1251)     LOS: 6 days    Time spent: 45 minutes spent on chart review, discussion with nursing staff, consultants, updating family and interview/physical exam; more than 50% of that time was spent in counseling and/or coordination of care.    Harlene RAYMOND Bowl, DO [facility] Available via Epic secure chat 7am-7pm After these hours, please refer to coverage provider listed on [email or web address] 03/18/2024, 1:47 PM   "

## 2024-03-18 NOTE — Progress Notes (Signed)
 Mobility Specialist Progress Note:    03/18/24 1156  Mobility  Activity Turned to back - supine (Ankle Pumps, Leg Lifts, Heel Slides)  Level of Assistance Standby assist, set-up cues, supervision of patient - no hands on  Assistive Device None  Range of Motion/Exercises Active Assistive  Activity Response Tolerated fair  Mobility Referral Yes  Mobility visit 1 Mobility  Mobility Specialist Start Time (ACUTE ONLY) 1156  Mobility Specialist Stop Time (ACUTE ONLY) 1203  Mobility Specialist Time Calculation (min) (ACUTE ONLY) 7 min   Received pt laying in bed not agreeable to ambulating today but agreeable to bed mobility. Pt c/o breathing seeming more difficult today, fatigue, and increased anxiety. Pt able to perform movements w/ active assist. Left pt in bed w/ all needs met.   Venetia Keel Mobility Specialist Please Neurosurgeon or Rehab Office at [phone]

## 2024-03-19 DIAGNOSIS — J150 Pneumonia due to Klebsiella pneumoniae: Secondary | ICD-10-CM | POA: Diagnosis not present

## 2024-03-19 LAB — GLUCOSE, CAPILLARY
Glucose-Capillary: 144 mg/dL — ABNORMAL HIGH (ref 70–99)
Glucose-Capillary: 141 mg/dL — ABNORMAL HIGH (ref 70–99)
Glucose-Capillary: 187 mg/dL — ABNORMAL HIGH (ref 70–99)
Glucose-Capillary: 136 mg/dL — ABNORMAL HIGH (ref 70–99)

## 2024-03-19 NOTE — Progress Notes (Addendum)
 " PROGRESS NOTE    Steven Guzman  FMW:[id number] DOB: [birth year] DOA: 03/12/2024 PCP: Esmeralda Morton SAUNDERS, PA-C   Brief Narrative:   83 yr old man with HTN, COPD, chronic hypoxic respiratory failure, CAD, HFrEF, and VT with ICD presenting with pleuritic chest pain and SOB.  Patient reports a recent hospitalization, posthospitalization remain really weak, developed left-sided chest pain with shortness of breath overnight.  CTA is negative for PE,7.3 x 6.6 cm rounded ill-defined masslike consolidative opacity - pneumonia (vs neoplasm). 4.5 cm diameter ascending thoracic aortic aneurysm.  Was slowly improving until 12/21 patient states he has  worsening breathing and weakness.  He does not feel like he can go home safely with home health as his wife is already in a rehab  Pending placement to rehab.  Assessment & Plan:  Principal Problem:   Pneumonia Active Problems:   Atypical chest pain   Acute on chronic combined systolic and diastolic CHF (congestive heart failure) (HCC)   CAD (coronary artery disease)   Essential hypertension   Hyperlipidemia   ICD (implantable cardioverter-defibrillator) in place   COPD GOLD GRADE C   AF (atrial fibrillation) (HCC)   BPH (benign prostatic hyperplasia)   Anemia   B12 deficiency   Degenerative cervical spinal stenosis   Bilateral sensorineural hearing loss   Chronic idiopathic thrombocytopenia (HCC)   GERD (gastroesophageal reflux disease)   Major depressive disorder with single episode, in partial remission   History of abdominal aortic aneurysm (AAA)   Prediabetes     Possible bacterial multifocal Pneumonia, POA: - Finished course of antibiotics - incentive spirometer and flutter valve - sputum cultures-- appears to be colonized as culture has rare Pseudomonas and Klebsiella - Continue steroids for now- wean to PO -SLP eval-advised not to use straw - Reviewed CXR which was done on 12/20 and it showed multifocal infiltrates. -  Outpatient chest imaging in about a month to ensure resolution.  Chronic hypoxic RF: continue with 3 L oxygen . Goal oxygen  saturation is 88-92%.   Hyponatremia - Stable   Constipation -bowel regimen   Acute on chronic combined systolic and diastolic CHF (congestive heart failure)  - Stable -TTE 03/04/24 showed EF of 35-40% - Continue with lasix  40 mg PO Daily   Atypical chest pain, resolved   Chronic idiopathic thrombocytopenia   monitoring   Bilateral sensorineural hearing loss Noted   Degenerative cervical spinal stenosis - No current complaint, denies any numbness or tingling, acute on chronic generalized weaknesses -consulted PT   B12 deficiency Resume supplements    Anemia Anemia of chronic disease with iron deficiency anemia -Continue ferrous sulfate , H&H stable   BPH (benign prostatic hyperplasia) Monitoring for urinary retention, currently no complaint, continue Flomax    AF (atrial fibrillation), permanent Stable -will continue Coreg , mexiletine, amiodarone , and Eliquis   - Defibrillator in place   COPD GOLD GRADE C -continue with inhalational bronchodilator therapy - Encouraging incentive spirometer, flutter valve, mucolytics   ICD (implantable cardioverter-defibrillator) in place Assessed in place, no recent history of discharge -Patient reports no recent changes.  Stable   Hyperlipidemia - On Repatha    Essential hypertension -hypotensive, holding home heart medications - Currently listed Meds spironolactone , Lasix , Coreg , amiodarone  .. Monitoring BP closely, restarting above meds judiciously     CAD (coronary artery disease) Stable, reviewing home medication resuming according Denies any chest pain   History of abdominal aortic aneurysm (AAA) Noted-stable   Major depressive disorder with single episode, in partial remission Currently stable, continue home medication of  Lexapro    GERD (gastroesophageal reflux disease) Continue PPI    Prediabetes Last A1c 5 months ago 5.8 -Due to steroids, anticipating hyperglycemia - SSI coverage  Disposition: Pending SNF placement. He was living at home prior to this hospitalization. His wife is in a SNF.  DVT prophylaxis: SCDs Start: 03/12/24 0808 apixaban  (ELIQUIS ) tablet 5 mg     Code Status: Full Code Family Communication:  None at the bedside Status is: Inpatient Remains inpatient appropriate because: PNA,pending placement    Subjective:    Examination:  General exam: Appears calm and comfortable, nasal oxygen  cannula in place Respiratory system: Clear to auscultation. Respiratory effort normal. Cardiovascular system: S1 & S2 heard, RRR. No JVD, murmurs, rubs, gallops or clicks. No pedal edema. Gastrointestinal system: Abdomen is nondistended, soft and nontender. No organomegaly or masses felt. Normal bowel sounds heard. Central nervous system: Alert and oriented. No focal neurological deficits. Extremities: Symmetric 5 x 5 power. Skin: No rashes, lesions or ulcers    Diet Orders (From admission, onward)     Start     Ordered   03/12/24 0808  Diet regular Room service appropriate? Yes; Fluid consistency: Thin  Diet effective now       Question Answer Comment  Room service appropriate? Yes   Fluid consistency: Thin      03/12/24 0809            Objective: Vitals:   03/18/24 2335 03/19/24 0330 03/19/24 0719 03/19/24 1103  BP: 111/77 102/68 111/74 107/73  Pulse: 75 64 70   Resp: (!) 24 (!) 21 19 17   Temp: (!) 97.2 F (36.2 C) 97.9 F (36.6 C) 97.7 F (36.5 C) (!) 97.2 F (36.2 C)  TempSrc: Temporal Temporal Oral Oral  SpO2: 96% 90% 97% 92%  Weight:  66.6 kg    Height:        Intake/Output Summary (Last 24 hours) at 03/19/2024 1130 Last data filed at 03/19/2024 0800 Gross per 24 hour  Intake 1760 ml  Output 2400 ml  Net -640 ml   Filed Weights   03/17/24 0302 03/18/24 0633 03/19/24 0330  Weight: 65.1 kg 66.7 kg 66.6 kg    Scheduled  Meds:  acidophilus  1 capsule Oral BID   amiodarone   200 mg Oral Daily   apixaban   5 mg Oral BID   arformoterol   15 mcg Nebulization BID   And   umeclidinium bromide   1 puff Inhalation Daily   aspirin  EC  81 mg Oral Daily   carvedilol   3.125 mg Oral BID   empagliflozin   10 mg Oral Daily   escitalopram   20 mg Oral Daily   feeding supplement  237 mL Oral BID BM   ferrous sulfate    Oral Daily   furosemide   40 mg Oral Daily   guaiFENesin   1,200 mg Oral Q8H   insulin  aspart  0-9 Units Subcutaneous TID WC   lidocaine   1 patch Transdermal Q24H   magnesium  oxide  400 mg Oral Daily   melatonin  3 mg Oral QHS   mexiletine  200 mg Oral BID   nystatin   5 mL Oral QID   pantoprazole   40 mg Oral BID   predniSONE   40 mg Oral Q breakfast   pregabalin   25 mg Oral BID   senna-docusate  1 tablet Oral BID   sodium chloride  flush  3 mL Intravenous Q12H   sodium chloride  flush  3 mL Intravenous Q12H   tamsulosin   0.4 mg Oral Daily  Continuous Infusions:  Nutritional status     Body mass index is 21.07 kg/m.  Data Reviewed:   CBC: Recent Labs  Lab 03/14/24 0216 03/15/24 0239 03/16/24 0304 03/17/24 0318 03/18/24 0303  WBC 10.9* 8.3 8.1 8.7 7.2  HGB 10.0* 9.9* 10.5* 11.0* 10.6*  HCT 33.0* 32.5* 33.9* 35.6* 34.7*  MCV 96.5 95.6 95.8 96.0 95.3  PLT 183 206 244 259 257   Basic Metabolic Panel: Recent Labs  Lab 03/14/24 0216 03/14/24 1036 03/15/24 0239 03/16/24 0304 03/17/24 0318 03/18/24 0303  NA 132*  --  133* 132* 133* 134*  K 4.3  --  4.1 4.1 3.9 3.5  CL 94*  --  96* 96* 95* 95*  CO2 29  --  30 31 30  33*  GLUCOSE 133*  --  111* 107* 120* 139*  BUN 16  --  13 14 15 12   CREATININE 0.63  --  0.45* 0.41* 0.46* 0.49*  CALCIUM  8.4*  --  8.3* 8.7* 8.4* 8.2*  MG  --  2.2  --   --   --   --    GFR: Estimated Creatinine Clearance: 63.6 mL/min (A) (by C-G formula based on SCr of 0.49 mg/dL (L)). Liver Function Tests: No results for input(s): AST, ALT, ALKPHOS, BILITOT,  PROT, ALBUMIN in the last 168 hours. No results for input(s): LIPASE, AMYLASE in the last 168 hours. No results for input(s): AMMONIA in the last 168 hours. Coagulation Profile: Recent Labs  Lab 03/13/24 0326  INR 1.7*   Cardiac Enzymes: No results for input(s): CKTOTAL, CKMB, CKMBINDEX, TROPONINI in the last 168 hours. BNP (last 3 results) No results for input(s): PROBNP in the last 8760 hours. HbA1C: No results for input(s): HGBA1C in the last 72 hours. CBG: Recent Labs  Lab 03/18/24 0610 03/18/24 1111 03/18/24 1625 03/18/24 2104 03/19/24 0819  GLUCAP 127* 160* 150* 151* 144*   Lipid Profile: No results for input(s): CHOL, HDL, LDLCALC, TRIG, CHOLHDL, LDLDIRECT in the last 72 hours. Thyroid  Function Tests: No results for input(s): TSH, T4TOTAL, FREET4, T3FREE, THYROIDAB in the last 72 hours. Anemia Panel: No results for input(s): VITAMINB12, FOLATE, FERRITIN, TIBC, IRON, RETICCTPCT in the last 72 hours. Sepsis Labs: Recent Labs  Lab 03/13/24 0326  LATICACIDVEN 0.9    Recent Results (from the past 240 hours)  Culture, blood (routine x 2) Call MD if unable to obtain prior to antibiotics being given     Status: None   Collection Time: 03/12/24  8:11 AM   Specimen: BLOOD  Result Value Ref Range Status   Specimen Description BLOOD RIGHT ANTECUBITAL  Final   Special Requests   Final    BOTTLES DRAWN AEROBIC AND ANAEROBIC Blood Culture results may not be optimal due to an inadequate volume of blood received in culture bottles   Culture   Final    NO GROWTH 5 DAYS Performed at [facility], [address], [place], [place] [address]    Report Status 03/17/2024 FINAL  Final  Culture, blood (routine x 2) Call MD if unable to obtain prior to antibiotics being given     Status: None   Collection Time: 03/12/24 10:59 AM   Specimen: BLOOD RIGHT ARM  Result Value Ref Range Status   Specimen Description BLOOD RIGHT  ARM  Final   Special Requests   Final    BOTTLES DRAWN AEROBIC AND ANAEROBIC Blood Culture adequate volume   Culture   Final    NO GROWTH 5 DAYS Performed  at [facility], [address] [place], [place] [address]    Report Status 03/17/2024 FINAL  Final  Expectorated Sputum Assessment w Gram Stain, Rflx to Resp Cult     Status: None   Collection Time: 03/14/24  8:12 AM   Specimen: Expectorated Sputum  Result Value Ref Range Status   Specimen Description EXPECTORATED SPUTUM  Final   Special Requests NONE  Final   Sputum evaluation   Final    THIS SPECIMEN IS ACCEPTABLE FOR SPUTUM CULTURE Performed at [facility], [address], [place], [place] [address]    Report Status 03/15/2024 FINAL  Final  Culture, Respiratory w Gram Stain     Status: None   Collection Time: 03/14/24  8:12 AM  Result Value Ref Range Status   Specimen Description EXPECTORATED SPUTUM  Final   Special Requests NONE Reflexed from [id number]  Final   Gram Stain   Final    FEW WBC PRESENT, PREDOMINANTLY PMN RARE GRAM NEGATIVE RODS RARE GRAM POSITIVE RODS Performed at [facility], [address], [place], [place] [address]    Culture   Final    RARE PSEUDOMONAS AERUGINOSA RARE KLEBSIELLA AEROGENES    Report Status 03/18/2024 FINAL  Final   Organism ID, Bacteria PSEUDOMONAS AERUGINOSA  Final   Organism ID, Bacteria KLEBSIELLA AEROGENES  Final      Susceptibility   Klebsiella aerogenes - MIC*    CEFEPIME <=0.12 SENSITIVE Sensitive     ERTAPENEM <=0.12 SENSITIVE Sensitive     CEFTRIAXONE <=0.25 SENSITIVE Sensitive     CIPROFLOXACIN <=0.06 SENSITIVE Sensitive     GENTAMICIN  <=1 SENSITIVE Sensitive     MEROPENEM <=0.25 SENSITIVE Sensitive     TRIMETH/SULFA <=20 SENSITIVE Sensitive     PIP/TAZO Value in next row Sensitive      <=4 SENSITIVEThis is a modified FDA-approved test that has been validated and its performance characteristics determined by the reporting laboratory.  This laboratory  is certified under the Clinical Laboratory Improvement Amendments CLIA as qualified to perform high complexity clinical laboratory testing.    * RARE KLEBSIELLA AEROGENES   Pseudomonas aeruginosa - MIC*    MEROPENEM Value in next row Sensitive      <=4 SENSITIVEThis is a modified FDA-approved test that has been validated and its performance characteristics determined by the reporting laboratory.  This laboratory is certified under the Clinical Laboratory Improvement Amendments CLIA as qualified to perform high complexity clinical laboratory testing.    CIPROFLOXACIN Value in next row Sensitive      <=4 SENSITIVEThis is a modified FDA-approved test that has been validated and its performance characteristics determined by the reporting laboratory.  This laboratory is certified under the Clinical Laboratory Improvement Amendments CLIA as qualified to perform high complexity clinical laboratory testing.    IMIPENEM Value in next row Sensitive      <=4 SENSITIVEThis is a modified FDA-approved test that has been validated and its performance characteristics determined by the reporting laboratory.  This laboratory is certified under the Clinical Laboratory Improvement Amendments CLIA as qualified to perform high complexity clinical laboratory testing.    PIP/TAZO Value in next row Sensitive      8 SENSITIVEThis is a modified FDA-approved test that has been validated and its performance characteristics determined by the reporting laboratory.  This laboratory is certified under the Clinical Laboratory Improvement Amendments CLIA as qualified to perform high complexity clinical laboratory testing.    CEFEPIME Value in next row Sensitive  8 SENSITIVEThis is a modified FDA-approved test that has been validated and its performance characteristics determined by the reporting laboratory.  This laboratory is certified under the Clinical Laboratory Improvement Amendments CLIA as qualified to perform high complexity  clinical laboratory testing.    CEFTAZIDIME/AVIBACTAM Value in next row Sensitive      8 SENSITIVEThis is a modified FDA-approved test that has been validated and its performance characteristics determined by the reporting laboratory.  This laboratory is certified under the Clinical Laboratory Improvement Amendments CLIA as qualified to perform high complexity clinical laboratory testing.    CEFTOLOZANE/TAZOBACTAM Value in next row Sensitive      8 SENSITIVEThis is a modified FDA-approved test that has been validated and its performance characteristics determined by the reporting laboratory.  This laboratory is certified under the Clinical Laboratory Improvement Amendments CLIA as qualified to perform high complexity clinical laboratory testing.    TOBRAMYCIN Value in next row Sensitive      8 SENSITIVEThis is a modified FDA-approved test that has been validated and its performance characteristics determined by the reporting laboratory.  This laboratory is certified under the Clinical Laboratory Improvement Amendments CLIA as qualified to perform high complexity clinical laboratory testing.    CEFTAZIDIME Value in next row Sensitive      8 SENSITIVEThis is a modified FDA-approved test that has been validated and its performance characteristics determined by the reporting laboratory.  This laboratory is certified under the Clinical Laboratory Improvement Amendments CLIA as qualified to perform high complexity clinical laboratory testing.    * RARE PSEUDOMONAS AERUGINOSA         Radiology Studies: DG CHEST PORT 1 VIEW Result Date: 03/18/2024 CLINICAL DATA:  Dyspnea. EXAM: PORTABLE CHEST 1 VIEW COMPARISON:  03/12/2024 radiograph and CT FINDINGS: Left lung opacity is becoming more confluent and extending peripherally to the pleural surface. Increasing patchy opacity at the right lung base. Stable heart size and mediastinal contours. Hiatal hernia. Left-sided pacemaker in place. Right-sided battery  pack with lead coursing into the neck. No pulmonary edema. No pneumothorax or significant pleural effusion. IMPRESSION: Left lung opacity is becoming more confluent and extending peripherally to the pleural surface. Increasing patchy opacity at the right lung base. Findings are suspicious for multifocal pneumonia. Radiographic follow-up to resolution recommended. Electronically Signed   By: Andrea Gasman M.D.   On: 03/18/2024 15:30           LOS: 7 days   Time spent= 35 mins    Deliliah Room, MD [facility]  If 7PM-7AM, please contact night-coverage  03/19/2024, 11:30 AM  "

## 2024-03-19 NOTE — Progress Notes (Signed)
 Physical Therapy Treatment Patient Details Name: Steven Guzman MRN: [id number] DOB: Jun 04, 1940 Today's Date: 03/19/2024   History of Present Illness 83 yo M adm 12/14 with SOB and CP. Pt with workup indicating COPD exacerbation, lung mass, and PNA. PMH includes: COPD, CHF, CAD MI, aortic aneurysm, ACDF,  chronic neck/back pain, anemia, depression, chronic idiopathic thrombocytopenia,    PT Comments  Pt agreeable to session, eager to progress mobility. However, pt demos increased difficulty with ambulation and endurance this session, requiring increased O2 (up to 4L) to recover SpO2 to 90s after shorter distance hallway ambulation. Pt needed 5 min seated rest with 4L and cues for PLB to recover SpO2 to 90s. Pt reports concerns about managing independently at home given significant deficits in activity tolerance and new O2 needs, will benefit from short stint inpatient rehab <3hours/day until he is able to progress endurance and dynamic stability to manage more independently at home.     If plan is discharge home, recommend the following: A little help with walking and/or transfers;A little help with bathing/dressing/bathroom;Assistance with cooking/housework;Assist for transportation   Can travel by private vehicle     Yes  Equipment Recommendations  Rollator (4 wheels)    Recommendations for Other Services       Precautions / Restrictions Precautions Precautions: Fall Recall of Precautions/Restrictions: Intact Precaution/Restrictions Comments: watch O2, up to 4L needed for mobility Restrictions Weight Bearing Restrictions Per Provider Order: No     Mobility  Bed Mobility Overal bed mobility: Needs Assistance Bed Mobility: Supine to Sit, Sit to Supine     Supine to sit: Supervision, HOB elevated Sit to supine: Supervision   General bed mobility comments: increased time to complete    Transfers Overall transfer level: Needs assistance Equipment used: Straight  cane Transfers: Sit to/from Stand Sit to Stand: Contact guard assist           General transfer comment: CGA with use of cane, completed x3, stable without assistance    Ambulation/Gait Ambulation/Gait assistance: Contact guard assist, Min assist Gait Distance (Feet): 45 Feet (+ 15 ft) Assistive device: Straight cane Gait Pattern/deviations: Step-through pattern, Decreased stride length, Trunk flexed Gait velocity: reduced     General Gait Details: pt requesting seated rest after ~30 ft, was able to walk additional 15 ft to chair for seated rest, SpO2 reading 78% with poor pleth, O2 increased from 2L to 4L and pt encouraged to do PLB. with good pleth after 5 min sitting, SpO2 to 91%   Stairs             Wheelchair Mobility     Tilt Bed    Modified Rankin (Stroke Patients Only)       Balance Overall balance assessment: Needs assistance Sitting-balance support: No upper extremity supported, Feet supported Sitting balance-Leahy Scale: Good Sitting balance - Comments: seated EOB   Standing balance support: Bilateral upper extremity supported, During functional activity, Reliant on assistive device for balance Standing balance-Leahy Scale: Poor Standing balance comment: reliant on external support for stability                            Communication Communication Communication: Impaired Factors Affecting Communication: Hearing impaired  Cognition Arousal: Alert Behavior During Therapy: WFL for tasks assessed/performed   PT - Cognitive impairments: No apparent impairments, Awareness, Attention, Problem solving  PT - Cognition Comments: poor insight to deficits and verbalizing sx, poor insight to mobility limits and need for seated rest. tangential and difficult to redirect Following commands: Intact      Cueing Cueing Techniques: Verbal cues  Exercises      General Comments General comments (skin integrity,  edema, etc.): SpO2 with poor pleth, stable on 2L at rest, but needing up to 4L to recover after gait. SpO2 reading 78% after 45 ft ambulation, took 5 min and seated rest on 4L to recover to 91%      Pertinent Vitals/Pain Pain Assessment Pain Assessment: No/denies pain     PT Goals (current goals can now be found in the care plan section) Acute Rehab PT Goals Patient Stated Goal: return home Progress towards PT goals: Progressing toward goals    Frequency    Min 2X/week       AM-PAC PT 6 Clicks Mobility   Outcome Measure  Help needed turning from your back to your side while in a flat bed without using bedrails?: None Help needed moving from lying on your back to sitting on the side of a flat bed without using bedrails?: A Little Help needed moving to and from a bed to a chair (including a wheelchair)?: A Little Help needed standing up from a chair using your arms (e.g., wheelchair or bedside chair)?: A Little Help needed to walk in hospital room?: A Little Help needed climbing 3-5 steps with a railing? : Total 6 Click Score: 17    End of Session Equipment Utilized During Treatment: Gait belt;Oxygen  Activity Tolerance: Patient tolerated treatment well Patient left: in bed;with call bell/phone within reach Nurse Communication: Mobility status (O2 needs) PT Visit Diagnosis: Unsteadiness on feet (R26.81);Other abnormalities of gait and mobility (R26.89);Muscle weakness (generalized) (M62.81)     Time: 8952-8870 PT Time Calculation (min) (ACUTE ONLY): 42 min  Charges:    $Therapeutic Exercise: 23-37 mins $Therapeutic Activity: 8-22 mins PT General Charges $$ ACUTE PT VISIT: 1 Visit                     Izetta Call, PT, DPT   Acute Rehabilitation Department Office [phone] Secure Chat Communication Preferred   Izetta JULIANNA Call 03/19/2024, 12:48 PM

## 2024-03-19 NOTE — Plan of Care (Signed)
   Problem: Education: Goal: Ability to describe self-care measures that may prevent or decrease complications (Diabetes Survival Skills Education) will improve Outcome: Progressing

## 2024-03-20 ENCOUNTER — Telehealth: Payer: Self-pay | Admitting: Internal Medicine

## 2024-03-20 DIAGNOSIS — J150 Pneumonia due to Klebsiella pneumoniae: Secondary | ICD-10-CM | POA: Diagnosis not present

## 2024-03-20 LAB — GLUCOSE, CAPILLARY
Glucose-Capillary: 101 mg/dL — ABNORMAL HIGH (ref 70–99)
Glucose-Capillary: 129 mg/dL — ABNORMAL HIGH (ref 70–99)
Glucose-Capillary: 145 mg/dL — ABNORMAL HIGH (ref 70–99)
Glucose-Capillary: 160 mg/dL — ABNORMAL HIGH (ref 70–99)

## 2024-03-20 NOTE — Progress Notes (Signed)
 Physical Therapy Treatment Patient Details Name: Steven Guzman MRN: [id number] DOB: [birth year] Today's Date: 03/20/2024   History of Present Illness 83 yo M adm 12/14 with SOB and CP. Pt with workup indicating COPD exacerbation, lung mass, and PNA. PMH includes: COPD, CHF, CAD MI, aortic aneurysm, ACDF,  chronic neck/back pain, anemia, depression, chronic idiopathic thrombocytopenia,    PT Comments  Pt requesting to trial [facility] this session as this is what he was using prior to admission. Therapist verbalized understanding, but provided education on benefits of rollator use in regards to activity pacing and energy conservation. Pt verbalized understanding but continuing to request use of SPC. Pt ambulated with SPC and no physical assistance, but continues to be limited secondary to SOB and fatigue with pt requiring several standing rest breaks in which he leaned against wall in hallway utilizing railing for support. Pt continues to have poor insight into deficits and mobility needs requiring prompting throughout session for pursed lip breathing and activity pacing. PT will continue to treat pt while he is admitted. Patient will benefit from continued inpatient follow up therapy, <3 hours/day.     If plan is discharge home, recommend the following: A little help with walking and/or transfers;A little help with bathing/dressing/bathroom;Assistance with cooking/housework;Assist for transportation   Can travel by private vehicle     Yes  Equipment Recommendations  Rollator (4 wheels)    Recommendations for Other Services       Precautions / Restrictions Precautions Precautions: Fall Recall of Precautions/Restrictions: Impaired (therapist left room to grab pulse ox and returned to find pt standing w/out staff present) Precaution/Restrictions Comments: watch O2, up to 4L needed for mobility Restrictions Weight Bearing Restrictions Per Provider Order: No     Mobility  Bed Mobility Overal  bed mobility: Modified Independent Bed Mobility: Supine to Sit, Sit to Supine     Supine to sit: Modified independent (Device/Increase time) Sit to supine: Modified independent (Device/Increase time)   General bed mobility comments: increased time to complete    Transfers Overall transfer level: Needs assistance Equipment used: Straight cane Transfers: Sit to/from Stand Sit to Stand: Supervision           General transfer comment: increased time to complete    Ambulation/Gait Ambulation/Gait assistance: Contact guard assist Gait Distance (Feet): 125 Feet (x2 with standing rest break for oxygen  management) Assistive device: Straight cane Gait Pattern/deviations: Step-through pattern, Decreased step length - left, Decreased stance time - left, Decreased stride length, Decreased weight shift to left, Staggering right, Trunk flexed, Narrow base of support Gait velocity: reduced Gait velocity interpretation: <1.8 ft/sec, indicate of risk for recurrent falls   General Gait Details: Pt demonstrates narrowed BOS with significant ER of BLE. Pt occasionally displaying L lateral LOB but does not require physical assistance for maintaining upright. Pt requires standing rest break due to feeling SOB, pulse ox not able to read, with therapist bumping up to 4L for remainder of ambulation.   Stairs             Wheelchair Mobility     Tilt Bed    Modified Rankin (Stroke Patients Only)       Balance Overall balance assessment: Needs assistance Sitting-balance support: No upper extremity supported, Feet supported Sitting balance-Leahy Scale: Good Sitting balance - Comments: seated EOB   Standing balance support: Single extremity supported, During functional activity, Reliant on assistive device for balance Standing balance-Leahy Scale: Poor Standing balance comment: reliant on external support for stability. one L lateral  LOB but no physical assistance required for  correcting                            Communication Communication Communication: Impaired Factors Affecting Communication: Hearing impaired  Cognition Arousal: Alert Behavior During Therapy: WFL for tasks assessed/performed   PT - Cognitive impairments: Awareness, Attention, Problem solving, Safety/Judgement                       PT - Cognition Comments: pt has poor insight to deficits and poor awareness of activity pacing requiring prompting from therapist frequently. Following commands: Intact      Cueing Cueing Techniques: Verbal cues  Exercises      General Comments General comments (skin integrity, edema, etc.): SpO2 unable to read throughout bout of ambulation, but pt reporting SOB with therapist bumping oxygen  from 3L to 4L for remainder of session. At end of session, pt left on 3L with SpO2 reading 94% with consistent waveform.      Pertinent Vitals/Pain Pain Assessment Pain Assessment: No/denies pain    Home Living                          Prior Function            PT Goals (current goals can now be found in the care plan section) Acute Rehab PT Goals Patient Stated Goal: return home Progress towards PT goals: Progressing toward goals    Frequency    Min 2X/week      PT Plan      Co-evaluation              AM-PAC PT 6 Clicks Mobility   Outcome Measure  Help needed turning from your back to your side while in a flat bed without using bedrails?: None Help needed moving from lying on your back to sitting on the side of a flat bed without using bedrails?: None Help needed moving to and from a bed to a chair (including a wheelchair)?: A Little Help needed standing up from a chair using your arms (e.g., wheelchair or bedside chair)?: A Little Help needed to walk in hospital room?: A Little Help needed climbing 3-5 steps with a railing? : Total 6 Click Score: 18    End of Session Equipment Utilized During  Treatment: Gait belt;Oxygen  Activity Tolerance: Patient tolerated treatment well Patient left: in bed;with call bell/phone within reach Nurse Communication: Mobility status PT Visit Diagnosis: Unsteadiness on feet (R26.81);Other abnormalities of gait and mobility (R26.89);Muscle weakness (generalized) (M62.81)     Time: 1455-1520 PT Time Calculation (min) (ACUTE ONLY): 25 min  Charges:    $Therapeutic Activity: 23-37 mins PT General Charges $$ ACUTE PT VISIT: 1 Visit                     Leontine Hilt DPT Acute Rehab Services [phone] Prefer contact via chat    Leontine NOVAK Steven Guzman 03/20/2024, 3:55 PM

## 2024-03-20 NOTE — Progress Notes (Signed)
 Occupational Therapy Treatment Patient Details Name: Steven Guzman MRN: [id number] DOB: [birth year] Today's Date: 03/20/2024   History of present illness 83 yo M adm 12/14 with SOB and CP. Pt with workup indicating COPD exacerbation, lung mass, and PNA. PMH includes: COPD, CHF, CAD MI, aortic aneurysm, ACDF,  chronic neck/back pain, anemia, depression, chronic idiopathic thrombocytopenia,   OT comments  Pt. Seen for skilled OT treatment session.  Session focused on integration of energy conservation and breathing strategies during mobility/ADL completion.  Pt. Was receptive to education and worked consistently on PLB before,during, and after activity.  Pt. Was set up seated for LB dressing.  CGA for stand pivot transfer to/from [facility].  Note d/c plan is now for <3hrs/day of cont. Therapy prior to home.  Alerted OTR/L to cosign/updated d/c recommendations.  Cont. With acute OT POC while pt. Here.        If plan is discharge home, recommend the following:  Assist for transportation;A little help with walking and/or transfers;A little help with bathing/dressing/bathroom   Equipment Recommendations  None recommended by OT    Recommendations for Other Services      Precautions / Restrictions Precautions Precautions: Fall Precaution/Restrictions Comments: watch O2, up to 4L needed for mobility       Mobility Bed Mobility               General bed mobility comments: seated eob at beg/end of session    Transfers Overall transfer level: Needs assistance Equipment used: Straight cane Transfers: Sit to/from Stand, Bed to chair/wheelchair/BSC Sit to Stand: Contact guard assist Stand pivot transfers: Independent   Step pivot transfers: Contact guard assist     General transfer comment: CGA with use of cane, stable without assistance-cues for PLB and initiation of rest breaks     Balance                                           ADL either performed or  assessed with clinical judgement   ADL Overall ADL's : Needs assistance/impaired                     Lower Body Dressing: Sitting/lateral leans;Set up Lower Body Dressing Details (indicate cue type and reason): able to don slip on loafers seated eob Toilet Transfer: Contact guard assist;BSC/3in1;Stand-pivot Toilet Transfer Details (indicate cue type and reason): pt. requests use of his cane in RUE. stand pivot from eob to 3n1   Toileting - Clothing Manipulation Details (indicate cue type and reason): no observed as pt. did not have to actually       General ADL Comments: education, demo/return demo of energy conservation strategies for use during mobility/ADL completion.  pt. able to demo PLB and verbalized understanding along with need for more consistent use of his PEP device    Extremity/Trunk Assessment              Vision       Perception     Praxis     Communication Communication Communication: Impaired Factors Affecting Communication: Hearing impaired   Cognition Arousal: Alert Behavior During Therapy: WFL for tasks assessed/performed Cognition: No apparent impairments                               Following commands: Intact  Cueing   Cueing Techniques: Verbal cues  Exercises      Shoulder Instructions       General Comments  5Ps handouts provided for pt. As part of EC education and review    Pertinent Vitals/ Pain       Pain Assessment Pain Assessment: No/denies pain  Home Living                                          Prior Functioning/Environment              Frequency  Min 2X/week        Progress Toward Goals  OT Goals(current goals can now be found in the care plan section)  Progress towards OT goals: Progressing toward goals     Plan      Co-evaluation                 AM-PAC OT 6 Clicks Daily Activity     Outcome Measure   Help from another person eating meals?:  None Help from another person taking care of personal grooming?: None Help from another person toileting, which includes using toliet, bedpan, or urinal?: A Little Help from another person bathing (including washing, rinsing, drying)?: A Little Help from another person to put on and taking off regular upper body clothing?: A Little Help from another person to put on and taking off regular lower body clothing?: A Little 6 Click Score: 20    End of Session Equipment Utilized During Treatment: Oxygen ;Other (comment) (cane)  OT Visit Diagnosis: Unsteadiness on feet (R26.81);Muscle weakness (generalized) (M62.81)   Activity Tolerance Patient tolerated treatment well   Patient Left in bed;with call bell/phone within reach   Nurse Communication Other (comment) (RN states ok to work with pt.)        Time: 0938-1000 OT Time Calculation (min): 22 min  Charges: OT General Charges $OT Visit: 1 Visit OT Treatments $Self Care/Home Management : 8-22 mins  Randall, COTA/L Acute Rehabilitation [phone]   CHRISTELLA Nest Lorraine-COTA/L  03/20/2024, 10:51 AM

## 2024-03-20 NOTE — Progress Notes (Signed)
 " PROGRESS NOTE    Steven Guzman  FMW:[id number] DOB: [birth year] DOA: 03/12/2024 PCP: Esmeralda Morton SAUNDERS, PA-C   Brief Narrative:   83 yr old man with HTN, COPD, chronic hypoxic respiratory failure, CAD, HFrEF, and VT with ICD presenting with pleuritic chest pain and SOB.  Patient reports a recent hospitalization, posthospitalization remain really weak, developed left-sided chest pain with shortness of breath overnight.  CTA is negative for PE,7.3 x 6.6 cm rounded ill-defined masslike consolidative opacity - pneumonia (vs neoplasm). 4.5 cm diameter ascending thoracic aortic aneurysm.  Was slowly improving until 12/21 patient states he has  worsening breathing and weakness.  He does not feel like he can go home safely with home health as his wife is already in a rehab  Pending placement to rehab.  Assessment & Plan:  Principal Problem:   Pneumonia Active Problems:   Atypical chest pain   Acute on chronic combined systolic and diastolic CHF (congestive heart failure) (HCC)   CAD (coronary artery disease)   Essential hypertension   Hyperlipidemia   ICD (implantable cardioverter-defibrillator) in place   COPD GOLD GRADE C   AF (atrial fibrillation) (HCC)   BPH (benign prostatic hyperplasia)   Anemia   B12 deficiency   Degenerative cervical spinal stenosis   Bilateral sensorineural hearing loss   Chronic idiopathic thrombocytopenia (HCC)   GERD (gastroesophageal reflux disease)   Major depressive disorder with single episode, in partial remission   History of abdominal aortic aneurysm (AAA)   Prediabetes     Possible bacterial multifocal Pneumonia, POA: - Finished course of antibiotics - incentive spirometer and flutter valve - sputum cultures-- appears to be colonized as culture has rare Pseudomonas and Klebsiella - Continue steroids for now- wean to PO -SLP eval-advised not to use straw - Reviewed CXR which was done on 12/20 and it showed multifocal infiltrates. -  Outpatient chest imaging in about a month to ensure resolution.  Chronic hypoxic RF: continue with 3 L oxygen . Goal oxygen  saturation is 88-92%.   Hyponatremia - Stable   Constipation -bowel regimen   Acute on chronic combined systolic and diastolic CHF (congestive heart failure)  - Stable -TTE 03/04/24 showed EF of 35-40% - Continue with lasix  40 mg PO Daily   Atypical chest pain, resolved   Chronic idiopathic thrombocytopenia   monitoring   Bilateral sensorineural hearing loss Noted   Degenerative cervical spinal stenosis - No current complaint, denies any numbness or tingling, acute on chronic generalized weaknesses -continue with PT   B12 deficiency No acute issues   Anemia Anemia of chronic disease with iron deficiency anemia -Continue ferrous sulfate , H&H stable   BPH (benign prostatic hyperplasia) Monitoring for urinary retention, currently no complaint, continue Flomax    AF (atrial fibrillation), permanent Stable -will continue Coreg , mexiletine, amiodarone , and Eliquis   - Defibrillator in place   COPD GOLD GRADE C -continue with inhalational bronchodilator therapy - Encouraging incentive spirometer, flutter valve, mucolytics   ICD (implantable cardioverter-defibrillator) in place Assessed in place, no recent history of discharge -Patient reports no recent changes.  Stable   Hyperlipidemia - On Repatha    Essential hypertension -hypotensive, holding home heart medications - Currently listed Meds spironolactone , Lasix , Coreg , amiodarone  .. Monitoring BP closely, restarting above meds judiciously     CAD (coronary artery disease) Stable, reviewing home medication resuming according Denies any chest pain   History of abdominal aortic aneurysm (AAA) Noted-stable   Major depressive disorder with single episode, in partial remission Currently stable, continue home medication  of Lexapro    GERD (gastroesophageal reflux disease) Continue PPI    Prediabetes Last A1c 5 months ago 5.8 -Due to steroids, anticipating hyperglycemia - SSI coverage  Disposition: Pending SNF placement. He was living at home prior to this hospitalization. His wife is in a SNF.  DVT prophylaxis: SCDs Start: 03/12/24 0808 apixaban  (ELIQUIS ) tablet 5 mg     Code Status: Full Code Family Communication:  None at the bedside Status is: Inpatient Remains inpatient appropriate because: PNA,pending placement    Subjective:  No acute events overnight. Denied chest pain or shortness of breath. Awaiting placement to SNF.  Examination:  General exam: Appears calm and comfortable, nasal oxygen  cannula in place Respiratory system: Clear to auscultation. Respiratory effort normal. Cardiovascular system: S1 & S2 heard, RRR. No JVD, murmurs, rubs, gallops or clicks. No pedal edema. Gastrointestinal system: Abdomen is nondistended, soft and nontender. No organomegaly or masses felt. Normal bowel sounds heard. Central nervous system: Alert and oriented. No focal neurological deficits. Extremities: Symmetric 5 x 5 power. Skin: No rashes, lesions or ulcers    Diet Orders (From admission, onward)     Start     Ordered   03/12/24 0808  Diet regular Room service appropriate? Yes; Fluid consistency: Thin  Diet effective now       Question Answer Comment  Room service appropriate? Yes   Fluid consistency: Thin      03/12/24 0809            Objective: Vitals:   03/20/24 0300 03/20/24 0400 03/20/24 0757 03/20/24 0922  BP: 116/81  115/80   Pulse: 62  68   Resp: (!) 116 20 16   Temp: (!) 97.4 F (36.3 C)  (!) 97.4 F (36.3 C)   TempSrc: Oral  Oral   SpO2: 98%  95% 94%  Weight: 65.8 kg     Height: 5' 10 (1.778 m)       Intake/Output Summary (Last 24 hours) at 03/20/2024 1128 Last data filed at 03/20/2024 1100 Gross per 24 hour  Intake 363 ml  Output 3940 ml  Net -3577 ml   Filed Weights   03/18/24 0633 03/19/24 0330 03/20/24 0300  Weight:  66.7 kg 66.6 kg 65.8 kg    Scheduled Meds:  acidophilus  1 capsule Oral BID   amiodarone   200 mg Oral Daily   apixaban   5 mg Oral BID   arformoterol   15 mcg Nebulization BID   And   umeclidinium bromide   1 puff Inhalation Daily   aspirin  EC  81 mg Oral Daily   carvedilol   3.125 mg Oral BID   empagliflozin   10 mg Oral Daily   escitalopram   20 mg Oral Daily   feeding supplement  237 mL Oral BID BM   ferrous sulfate    Oral Daily   furosemide   40 mg Oral Daily   guaiFENesin   1,200 mg Oral Q8H   insulin  aspart  0-9 Units Subcutaneous TID WC   lidocaine   1 patch Transdermal Q24H   magnesium  oxide  400 mg Oral Daily   melatonin  3 mg Oral QHS   mexiletine  200 mg Oral BID   nystatin   5 mL Oral QID   pantoprazole   40 mg Oral BID   predniSONE   40 mg Oral Q breakfast   pregabalin   25 mg Oral BID   senna-docusate  1 tablet Oral BID   sodium chloride  flush  3 mL Intravenous Q12H   sodium chloride  flush  3 mL Intravenous  Q12H   tamsulosin   0.4 mg Oral Daily   Continuous Infusions:  Nutritional status     Body mass index is 20.81 kg/m.  Data Reviewed:   CBC: Recent Labs  Lab 03/14/24 0216 03/15/24 0239 03/16/24 0304 03/17/24 0318 03/18/24 0303  WBC 10.9* 8.3 8.1 8.7 7.2  HGB 10.0* 9.9* 10.5* 11.0* 10.6*  HCT 33.0* 32.5* 33.9* 35.6* 34.7*  MCV 96.5 95.6 95.8 96.0 95.3  PLT 183 206 244 259 257   Basic Metabolic Panel: Recent Labs  Lab 03/14/24 0216 03/14/24 1036 03/15/24 0239 03/16/24 0304 03/17/24 0318 03/18/24 0303  NA 132*  --  133* 132* 133* 134*  K 4.3  --  4.1 4.1 3.9 3.5  CL 94*  --  96* 96* 95* 95*  CO2 29  --  30 31 30  33*  GLUCOSE 133*  --  111* 107* 120* 139*  BUN 16  --  13 14 15 12   CREATININE 0.63  --  0.45* 0.41* 0.46* 0.49*  CALCIUM  8.4*  --  8.3* 8.7* 8.4* 8.2*  MG  --  2.2  --   --   --   --    GFR: Estimated Creatinine Clearance: 62.8 mL/min (A) (by C-G formula based on SCr of 0.49 mg/dL (L)). Liver Function Tests: No results for  input(s): AST, ALT, ALKPHOS, BILITOT, PROT, ALBUMIN in the last 168 hours. No results for input(s): LIPASE, AMYLASE in the last 168 hours. No results for input(s): AMMONIA in the last 168 hours. Coagulation Profile: No results for input(s): INR, PROTIME in the last 168 hours.  Cardiac Enzymes: No results for input(s): CKTOTAL, CKMB, CKMBINDEX, TROPONINI in the last 168 hours. BNP (last 3 results) No results for input(s): PROBNP in the last 8760 hours. HbA1C: No results for input(s): HGBA1C in the last 72 hours. CBG: Recent Labs  Lab 03/19/24 0819 03/19/24 1146 03/19/24 1527 03/19/24 2118 03/20/24 0534  GLUCAP 144* 141* 187* 136* 101*   Lipid Profile: No results for input(s): CHOL, HDL, LDLCALC, TRIG, CHOLHDL, LDLDIRECT in the last 72 hours. Thyroid  Function Tests: No results for input(s): TSH, T4TOTAL, FREET4, T3FREE, THYROIDAB in the last 72 hours. Anemia Panel: No results for input(s): VITAMINB12, FOLATE, FERRITIN, TIBC, IRON, RETICCTPCT in the last 72 hours. Sepsis Labs: No results for input(s): PROCALCITON, LATICACIDVEN in the last 168 hours.   Recent Results (from the past 240 hours)  Culture, blood (routine x 2) Call MD if unable to obtain prior to antibiotics being given     Status: None   Collection Time: 03/12/24  8:11 AM   Specimen: BLOOD  Result Value Ref Range Status   Specimen Description BLOOD RIGHT ANTECUBITAL  Final   Special Requests   Final    BOTTLES DRAWN AEROBIC AND ANAEROBIC Blood Culture results may not be optimal due to an inadequate volume of blood received in culture bottles   Culture   Final    NO GROWTH 5 DAYS Performed at [facility], [address], [place], [place] [address]    Report Status 03/17/2024 FINAL  Final  Culture, blood (routine x 2) Call MD if unable to obtain prior to antibiotics being given     Status: None   Collection Time: 03/12/24 10:59 AM    Specimen: BLOOD RIGHT ARM  Result Value Ref Range Status   Specimen Description BLOOD RIGHT ARM  Final   Special Requests   Final    BOTTLES DRAWN AEROBIC AND ANAEROBIC Blood Culture adequate volume  Culture   Final    NO GROWTH 5 DAYS Performed at [facility], [address], [place], [place] [address]    Report Status 03/17/2024 FINAL  Final  Expectorated Sputum Assessment w Gram Stain, Rflx to Resp Cult     Status: None   Collection Time: 03/14/24  8:12 AM   Specimen: Expectorated Sputum  Result Value Ref Range Status   Specimen Description EXPECTORATED SPUTUM  Final   Special Requests NONE  Final   Sputum evaluation   Final    THIS SPECIMEN IS ACCEPTABLE FOR SPUTUM CULTURE Performed at [facility], [address], [place], [place] [address]    Report Status 03/15/2024 FINAL  Final  Culture, Respiratory w Gram Stain     Status: None   Collection Time: 03/14/24  8:12 AM  Result Value Ref Range Status   Specimen Description EXPECTORATED SPUTUM  Final   Special Requests NONE Reflexed from [id number]  Final   Gram Stain   Final    FEW WBC PRESENT, PREDOMINANTLY PMN RARE GRAM NEGATIVE RODS RARE GRAM POSITIVE RODS Performed at [facility], [address] [place], [place] [address]    Culture   Final    RARE PSEUDOMONAS AERUGINOSA RARE KLEBSIELLA AEROGENES    Report Status 03/18/2024 FINAL  Final   Organism ID, Bacteria PSEUDOMONAS AERUGINOSA  Final   Organism ID, Bacteria KLEBSIELLA AEROGENES  Final      Susceptibility   Klebsiella aerogenes - MIC*    CEFEPIME <=0.12 SENSITIVE Sensitive     ERTAPENEM <=0.12 SENSITIVE Sensitive     CEFTRIAXONE <=0.25 SENSITIVE Sensitive     CIPROFLOXACIN <=0.06 SENSITIVE Sensitive     GENTAMICIN  <=1 SENSITIVE Sensitive     MEROPENEM <=0.25 SENSITIVE Sensitive     TRIMETH/SULFA <=20 SENSITIVE Sensitive     PIP/TAZO Value in next row Sensitive      <=4 SENSITIVEThis is a modified FDA-approved test that has been validated  and its performance characteristics determined by the reporting laboratory.  This laboratory is certified under the Clinical Laboratory Improvement Amendments CLIA as qualified to perform high complexity clinical laboratory testing.    * RARE KLEBSIELLA AEROGENES   Pseudomonas aeruginosa - MIC*    MEROPENEM Value in next row Sensitive      <=4 SENSITIVEThis is a modified FDA-approved test that has been validated and its performance characteristics determined by the reporting laboratory.  This laboratory is certified under the Clinical Laboratory Improvement Amendments CLIA as qualified to perform high complexity clinical laboratory testing.    CIPROFLOXACIN Value in next row Sensitive      <=4 SENSITIVEThis is a modified FDA-approved test that has been validated and its performance characteristics determined by the reporting laboratory.  This laboratory is certified under the Clinical Laboratory Improvement Amendments CLIA as qualified to perform high complexity clinical laboratory testing.    IMIPENEM Value in next row Sensitive      <=4 SENSITIVEThis is a modified FDA-approved test that has been validated and its performance characteristics determined by the reporting laboratory.  This laboratory is certified under the Clinical Laboratory Improvement Amendments CLIA as qualified to perform high complexity clinical laboratory testing.    PIP/TAZO Value in next row Sensitive      8 SENSITIVEThis is a modified FDA-approved test that has been validated and its performance characteristics determined by the reporting laboratory.  This laboratory is certified under the Clinical Laboratory Improvement Amendments CLIA as qualified to perform high complexity  clinical laboratory testing.    CEFEPIME Value in next row Sensitive      8 SENSITIVEThis is a modified FDA-approved test that has been validated and its performance characteristics determined by the reporting laboratory.  This laboratory is certified under  the Clinical Laboratory Improvement Amendments CLIA as qualified to perform high complexity clinical laboratory testing.    CEFTAZIDIME/AVIBACTAM Value in next row Sensitive      8 SENSITIVEThis is a modified FDA-approved test that has been validated and its performance characteristics determined by the reporting laboratory.  This laboratory is certified under the Clinical Laboratory Improvement Amendments CLIA as qualified to perform high complexity clinical laboratory testing.    CEFTOLOZANE/TAZOBACTAM Value in next row Sensitive      8 SENSITIVEThis is a modified FDA-approved test that has been validated and its performance characteristics determined by the reporting laboratory.  This laboratory is certified under the Clinical Laboratory Improvement Amendments CLIA as qualified to perform high complexity clinical laboratory testing.    TOBRAMYCIN Value in next row Sensitive      8 SENSITIVEThis is a modified FDA-approved test that has been validated and its performance characteristics determined by the reporting laboratory.  This laboratory is certified under the Clinical Laboratory Improvement Amendments CLIA as qualified to perform high complexity clinical laboratory testing.    CEFTAZIDIME Value in next row Sensitive      8 SENSITIVEThis is a modified FDA-approved test that has been validated and its performance characteristics determined by the reporting laboratory.  This laboratory is certified under the Clinical Laboratory Improvement Amendments CLIA as qualified to perform high complexity clinical laboratory testing.    * RARE PSEUDOMONAS AERUGINOSA         Radiology Studies: DG CHEST PORT 1 VIEW Result Date: 03/18/2024 CLINICAL DATA:  Dyspnea. EXAM: PORTABLE CHEST 1 VIEW COMPARISON:  03/12/2024 radiograph and CT FINDINGS: Left lung opacity is becoming more confluent and extending peripherally to the pleural surface. Increasing patchy opacity at the right lung base. Stable heart size and  mediastinal contours. Hiatal hernia. Left-sided pacemaker in place. Right-sided battery pack with lead coursing into the neck. No pulmonary edema. No pneumothorax or significant pleural effusion. IMPRESSION: Left lung opacity is becoming more confluent and extending peripherally to the pleural surface. Increasing patchy opacity at the right lung base. Findings are suspicious for multifocal pneumonia. Radiographic follow-up to resolution recommended. Electronically Signed   By: Andrea Gasman M.D.   On: 03/18/2024 15:30           LOS: 8 days   Time spent= 35 mins    Deliliah Room, MD [facility]  If 7PM-7AM, please contact night-coverage  03/20/2024, 11:28 AM  "

## 2024-03-20 NOTE — [place] FL2 (Signed)
 " [place]  MEDICAID FL2 LEVEL OF CARE FORM     IDENTIFICATION  Patient Name: Steven Guzman Birthdate: [birth year] Sex: male Admission Date (Current Location): 03/12/2024  [facility] and [place] Number:  Producer, Television/film/video and Address:  [facility], [address], [place], [place] [address]      Provider Number: [id number]  Attending Physician Name and Address:  Dino Antu, MD  Relative Name and Phone Number:  Athanasius, Kesling, Emergency Contact  [phone] [facility])  ; Smith,Kendall  (SIL) [phone] (mobile)    Current Level of Care: Hospital Recommended Level of Care: Skilled Nursing Facility Prior Approval Number:    Date Approved/Denied:   PASRR Number: [id number]  Discharge Plan: SNF    Current Diagnoses: Patient Active Problem List   Diagnosis Date Noted   Pneumonia 03/12/2024   Prediabetes 03/12/2024   History of abdominal aortic aneurysm (AAA) 03/03/2024   Chronic idiopathic thrombocytopenia (HCC) 03/03/2024   Former smoker 03/03/2024   Bilateral sensorineural hearing loss 03/08/2023   Age-related vocal fold atrophy 08/03/2022   Inguinal hernia 04/09/2022   AAA (abdominal aortic aneurysm) 04/09/2022   NSTEMI (non-ST elevated myocardial infarction) (HCC) 03/18/2022   Closed displaced fracture of right femoral neck (HCC) 02/06/2022   Major depressive disorder with single episode, in partial remission 03/05/2020   Purpura 08/08/2019   Xerosis cutis 05/25/2019   Paraseptal emphysema (HCC) 12/15/2018   Tobacco abuse 12/15/2018   Left inguinal hernia 11/15/2018   Spondylolisthesis at L5-S1 level 10/14/2018   Senile osteoporosis 10/14/2018   Weight loss 09/29/2018   Lumbar trigger point syndrome 04/20/2018   GERD (gastroesophageal reflux disease)    Esophageal stricture    Degenerative cervical spinal stenosis 10/27/2017   Carotid bruit 10/27/2017   B12 deficiency 08/25/2017   Anemia 06/10/2017   BPH (benign prostatic  hyperplasia) 12/31/2016   Mass of throat 06/30/2015   Depression 05/24/2015   AF (atrial fibrillation) (HCC) 03/15/2015   Acute on chronic combined systolic and diastolic CHF (congestive heart failure) (HCC) 11/18/2014   COPD GOLD GRADE C 12/25/2013   CAD (coronary artery disease) 11/23/2012   Smokers' cough (HCC) 11/23/2012   Essential hypertension 11/23/2012   Hyperlipidemia 11/23/2012   Atypical chest pain 11/23/2012   ICD (implantable cardioverter-defibrillator) in place 11/23/2012    Orientation RESPIRATION BLADDER Height & Weight     Self, Time, Situation, Place  O2 (3L O2 [place]) Incontinent, External catheter Weight: 145 lb 1 oz (65.8 kg) Height:  5' 10 (177.8 cm)  BEHAVIORAL SYMPTOMS/MOOD NEUROLOGICAL BOWEL NUTRITION STATUS      Continent Diet (Please see discharge summary)  AMBULATORY STATUS COMMUNICATION OF NEEDS Skin   Extensive Assist Verbally Other (Comment) (Wound -Arm Anterior;Right;Upper)                       Personal Care Assistance Level of Assistance  Feeding, Bathing, Dressing Bathing Assistance: Limited assistance Feeding assistance: Limited assistance Dressing Assistance: Limited assistance     Functional Limitations Info  Sight, Speech, Hearing Sight Info: Impaired (eyeglasses) Hearing Info: Impaired (R and L) Speech Info: Adequate    SPECIAL CARE FACTORS FREQUENCY  PT (By licensed PT), OT (By licensed OT)     PT Frequency: 5x week OT Frequency: 5x week            Contractures Contractures Info: Not present    Additional Factors Info  Psychotropic, Insulin  Sliding Scale Code Status Info: Full Allergies Info: Sulfa Antibiotics, Cephalexin  Psychotropic  Info: lexapro , Lyrica  Insulin  Sliding Scale Info: Please see discharge summary       Current Medications (03/20/2024):  This is the current hospital active medication list Current Facility-Administered Medications  Medication Dose Route Frequency Provider Last Rate Last Admin    acetaminophen  (TYLENOL ) tablet 650 mg  650 mg Oral Q6H PRN Willette Jest A, MD   650 mg at 03/13/24 2130   Or   acetaminophen  (TYLENOL ) suppository 650 mg  650 mg Rectal Q6H PRN Willette Jest A, MD       acidophilus (RISAQUAD) capsule 1 capsule  1 capsule Oral BID Vann, Jessica U, DO   1 capsule at 03/20/24 9089   amiodarone  (PACERONE ) tablet 200 mg  200 mg Oral Daily Shahmehdi, Seyed A, MD   200 mg at 03/20/24 9090   apixaban  (ELIQUIS ) tablet 5 mg  5 mg Oral BID Shahmehdi, Seyed A, MD   5 mg at 03/20/24 0910   arformoterol  (BROVANA ) nebulizer solution 15 mcg  15 mcg Nebulization BID Vann, Jessica U, DO   15 mcg at 03/20/24 9077   And   umeclidinium bromide  (INCRUSE ELLIPTA ) 62.5 MCG/ACT 1 puff  1 puff Inhalation Daily Vann, Jessica U, DO   1 puff at 03/20/24 0910   aspirin  EC tablet 81 mg  81 mg Oral Daily Shahmehdi, Seyed A, MD   81 mg at 03/20/24 9090   bisacodyl  (DULCOLAX) EC tablet 5 mg  5 mg Oral Daily PRN Willette Jest A, MD   5 mg at 03/16/24 2242   bisacodyl  (DULCOLAX) suppository 10 mg  10 mg Rectal Daily PRN Vann, Jessica U, DO   10 mg at 03/16/24 1621   carvedilol  (COREG ) tablet 3.125 mg  3.125 mg Oral BID Shahmehdi, Seyed A, MD   3.125 mg at 03/20/24 0910   empagliflozin  (JARDIANCE ) tablet 10 mg  10 mg Oral Daily Vann, Jessica U, DO   10 mg at 03/20/24 9089   escitalopram  (LEXAPRO ) tablet 20 mg  20 mg Oral Daily Shahmehdi, Seyed A, MD   20 mg at 03/20/24 0910   feeding supplement (ENSURE PLUS HIGH PROTEIN) liquid 237 mL  237 mL Oral BID BM Vann, Jessica U, DO   237 mL at 03/19/24 1442   ferrous sulfate  tablet   Oral Daily Shahmehdi, Seyed A, MD   325 mg at 03/20/24 0910   furosemide  (LASIX ) tablet 40 mg  40 mg Oral Daily Vann, Jessica U, DO   40 mg at 03/20/24 0910   guaiFENesin  (MUCINEX ) 12 hr tablet 1,200 mg  1,200 mg Oral Q8H Shahmehdi, Seyed A, MD   1,200 mg at 03/20/24 9090   insulin  aspart (novoLOG ) injection 0-9 Units  0-9 Units Subcutaneous TID WC Shahmehdi, Seyed A,  MD   2 Units at 03/19/24 1628   ipratropium (ATROVENT ) nebulizer solution 0.5 mg  0.5 mg Nebulization Q6H PRN Shahmehdi, Seyed A, MD       lidocaine  (LIDODERM ) 5 % 1 patch  1 patch Transdermal Q24H Vann, Jessica U, DO   1 patch at 03/15/24 1007   magnesium  oxide (MAG-OX) tablet 400 mg  400 mg Oral Daily Shahmehdi, Seyed A, MD   400 mg at 03/20/24 0910   melatonin tablet 3 mg  3 mg Oral QHS Shahmehdi, Seyed A, MD   3 mg at 03/19/24 2111   mexiletine (MEXITIL ) capsule 200 mg  200 mg Oral BID Shahmehdi, Seyed A, MD   200 mg at 03/20/24 0910   mupirocin  ointment (BACTROBAN ) 2 % 1 Application  1 Application Topical Daily PRN Shahmehdi, Seyed A, MD       nitroGLYCERIN  (NITROSTAT ) SL tablet 0.4 mg  0.4 mg Sublingual Q5 min PRN Shahmehdi, Seyed A, MD       nystatin  (MYCOSTATIN ) 100000 UNIT/ML suspension 500,000 Units  5 mL Oral QID Vann, Jessica U, DO   500,000 Units at 03/20/24 0909   ondansetron  (ZOFRAN ) tablet 4 mg  4 mg Oral Q6H PRN Shahmehdi, Adriana LABOR, MD       Or   ondansetron  (ZOFRAN ) injection 4 mg  4 mg Intravenous Q6H PRN Shahmehdi, Seyed A, MD       oxyCODONE  (Oxy IR/ROXICODONE ) immediate release tablet 5 mg  5 mg Oral Q4H PRN Willette Adriana A, MD   5 mg at 03/13/24 2130   pantoprazole  (PROTONIX ) EC tablet 40 mg  40 mg Oral BID Vann, Jessica U, DO   40 mg at 03/20/24 9090   predniSONE  (DELTASONE ) tablet 40 mg  40 mg Oral Q breakfast Vann, Jessica U, DO   40 mg at 03/20/24 9385   pregabalin  (LYRICA ) capsule 25 mg  25 mg Oral BID Vann, Jessica U, DO   25 mg at 03/20/24 9089   senna-docusate (Senokot-S) tablet 1 tablet  1 tablet Oral BID Vann, Jessica U, DO   1 tablet at 03/20/24 9090   sodium chloride  flush (NS) 0.9 % injection 3 mL  3 mL Intravenous Q12H Shahmehdi, Seyed A, MD   3 mL at 03/20/24 9085   sodium chloride  flush (NS) 0.9 % injection 3 mL  3 mL Intravenous Q12H Shahmehdi, Seyed A, MD   3 mL at 03/20/24 0914   sorbitol, magnesium  hydroxide, mineral oil, glycerin (SMOG) enema  960 mL  Rectal Once PRN Vann, Jessica U, DO       tamsulosin  (FLOMAX ) capsule 0.4 mg  0.4 mg Oral Daily Shahmehdi, Seyed A, MD   0.4 mg at 03/20/24 9089   traZODone  (DESYREL ) tablet 25 mg  25 mg Oral QHS PRN Willette Adriana LABOR, MD         Discharge Medications: Please see discharge summary for a list of discharge medications.  Relevant Imaging Results:  Relevant Lab Results:   Additional Information SSN [id number]  Luise JAYSON Pan, LCSWA     "

## 2024-03-20 NOTE — Telephone Encounter (Signed)
 Pt aware he needs his remote monitor in order to be monitored.

## 2024-03-20 NOTE — TOC Progression Note (Addendum)
 Transition of Care [facility]) - Progression Note    Patient Details  Name: Steven Guzman MRN: [id number] Date of Birth: 28-Jul-1940  [facility]) CM/SW Contact  Luise JAYSON Pan, [place] Phone Number: 03/20/2024, 11:21 AM  Clinical Narrative:   PT updated rec to SNF STR. CSW followed up with patient about going to SNF. Patient is agreeable but does not want to go to [facility] (where his wife currently is for STR) or [facility]. Patient stated he would like to go to 1. [facility] (formally known as [facility]) 2. [facility] in [place]. Patient gave CSW permission to fax him out to other facilities within the [place] and [place] area.  CSW left VM for Venus with admissions at [facility].   12:57 PM CSW sent HIPAA compliant email to [place] at [facility] - [email or web address] regarding patient referral.   1:00 PM CSW left VM for [name] admission.  2:24 PM CSW left second VM for [facility].   3:56 PM CSW provided patient with other bed offers and their medicare.gov ratings. Sheree with [facility] informed CSW that they accepted patient referral. CSW notified patient. CSW to submit for auth.  4:43 PM Auth pending for [facility]. Id [id number].  CSW will continue to follow.    Expected Discharge Plan: Skilled Nursing Facility Barriers to Discharge: Continued Medical Work up, SNF Pending bed offer, English As A Second Language Teacher               Expected Discharge Plan and Services In-house Referral: NA Discharge Planning Services: CM Consult Post Acute Care Choice: Skilled Nursing Facility Living arrangements for the past 2 months: Single Family Home                 DME Arranged: N/A DME Agency: NA       HH Arranged: RN, PT, OT HH Agency: [facility] Date HH Agency Contacted: 03/14/24 Time HH Agency Contacted: 1654 Representative spoke with at [facility] Agency: [place]   Social Drivers of Health (SDOH)  Interventions SDOH Screenings   Food Insecurity: No Food Insecurity (03/13/2024)  Housing: Low Risk (03/13/2024)  Transportation Needs: No Transportation Needs (03/13/2024)  Utilities: Not At Risk (03/13/2024)  Alcohol  Screen: Low Risk (03/06/2021)  Depression (PHQ2-9): Low Risk (03/12/2022)  Financial Resource Strain: Medium Risk (03/06/2021)  Physical Activity: Insufficiently Active (03/06/2021)  Social Connections: Moderately Isolated (03/13/2024)  Stress: No Stress Concern Present (12/27/2021)   Received from [facility], [facility] visits prior to 05/30/2022.  Tobacco Use: High Risk (03/12/2024)    Readmission Risk Interventions    03/14/2024    4:52 PM  Readmission Risk Prevention Plan  Transportation Screening Complete  Medication Review (RN Care Manager) Complete  HRI or Home Care Consult Complete  Palliative Care Screening Not Applicable  Skilled Nursing Facility Not Applicable

## 2024-03-20 NOTE — Telephone Encounter (Signed)
 Patient is currently in the hospital, he will be going to rehab soon. He says that his defib will need to be monitored while he is there and wants to make sure we can still get transmissions, please advise.

## 2024-03-20 NOTE — Care Management Important Message (Signed)
 Important Message  Patient Details  Name: Steven Guzman MRN: [id number] Date of Birth: 26-Nov-1940   Important Message Given:  Yes - Medicare IM     [name] 03/20/2024, 12:10 PM

## 2024-03-21 DIAGNOSIS — J150 Pneumonia due to Klebsiella pneumoniae: Secondary | ICD-10-CM | POA: Diagnosis not present

## 2024-03-21 LAB — GLUCOSE, CAPILLARY
Glucose-Capillary: 137 mg/dL — ABNORMAL HIGH (ref 70–99)
Glucose-Capillary: 197 mg/dL — ABNORMAL HIGH (ref 70–99)
Glucose-Capillary: 144 mg/dL — ABNORMAL HIGH (ref 70–99)
Glucose-Capillary: 155 mg/dL — ABNORMAL HIGH (ref 70–99)

## 2024-03-21 MED ORDER — PNEUMOCOCCAL 20-VAL CONJ VACC 0.5 ML IM SUSY: 0.5000 mL | PREFILLED_SYRINGE | INTRAMUSCULAR | Status: DC

## 2024-03-21 NOTE — Progress Notes (Signed)
 SATURATION QUALIFICATIONS: (This note is used to comply with regulatory documentation for home oxygen )  Patient Saturations on Room Air at Rest =93%  Patient Saturations on Room Air while Ambulating = 88%  Patient Saturations on 3 Liters of oxygen  while Ambulating = 94%  Please briefly explain why patient needs home oxygen :  Pt was on home O2 PTA.  He continues to need O2 to maintain sats >90 during mobility.  Asberry HERO, PT, DPT  Acute Rehabilitation Secure chat is best for contact #[phone] office

## 2024-03-21 NOTE — Progress Notes (Signed)
 Physical Therapy Treatment Patient Details Name: Steven Guzman MRN: [id number] DOB: 1940-08-12 Today's Date: 03/21/2024   History of Present Illness 83 yo M adm 12/14 with SOB and CP. Pt with workup indicating COPD exacerbation, lung mass, and PNA. PMH includes: COPD, CHF, CAD MI, aortic aneurysm, ACDF,  chronic neck/back pain, anemia, depression, chronic idiopathic thrombocytopenia,    PT Comments  Pt is progressing well with PT, able to ambulate further today, but continues to need rest breaks throughout to handle the increased distance. He also requires 3 L O2 [place] to maintain sats in the 90s during gait. Pt is agreeable to using a rollator, but refused to practice with me on one today!  Continues to need encouragement to use an AD that will provide him with more support.    If plan is discharge home, recommend the following: A little help with walking and/or transfers;A little help with bathing/dressing/bathroom;Assistance with cooking/housework;Assist for transportation;Help with stairs or ramp for entrance   Can travel by private vehicle     Yes  Equipment Recommendations  Rollator (4 wheels);Other (comment) (home O2)    Recommendations for Other Services       Precautions / Restrictions Precautions Precautions: Fall;Other (comment) Precaution/Restrictions Comments: watch O2     Mobility  Bed Mobility Overal bed mobility: Modified Independent Bed Mobility: Supine to Sit, Sit to Supine     Supine to sit: Modified independent (Device/Increase time), HOB elevated, Used rails Sit to supine: Modified independent (Device/Increase time), HOB elevated, Used rails   General bed mobility comments: Pt using rails and HOB elevated ~35 degrees to get to and from supine to sit.    Transfers Overall transfer level: Needs assistance Equipment used: Straight cane Transfers: Sit to/from Stand Sit to Stand: Min assist           General transfer comment: Min assist from lower  chair without armrests, min guard to supervision from higher surfaces with armrests.  I discussed (again) with him using a RW and he says he is agreeable to a small rollator howver, when I offered to practice with one of ours, he declined and preferred the cane.    Ambulation/Gait Ambulation/Gait assistance: Contact guard assist Gait Distance (Feet): 75 Feet (x4 with seated rest breaks between bouts of walking) Assistive device: Straight cane Gait Pattern/deviations: Step-to pattern, Shuffle, Trunk flexed Gait velocity: decreased Gait velocity interpretation: 1.31 - 2.62 ft/sec, indicative of limited community ambulator   General Gait Details: pt with kyphosis, flexed trunk posture, decreased foot clearance bil, SOB 3/4 requiring 3 L O2 [place] to maintain O2 >90 during mobility (dropped to 88 on RA during just bed level mobility.   Stairs             Wheelchair Mobility     Tilt Bed    Modified Rankin (Stroke Patients Only)       Balance Overall balance assessment: Needs assistance Sitting-balance support: Feet supported, No upper extremity supported Sitting balance-Leahy Scale: Good     Standing balance support: Single extremity supported Standing balance-Leahy Scale: Poor Standing balance comment: reliant on external support for stability                            Communication Communication Factors Affecting Communication: Hearing impaired  Cognition Arousal: Alert Behavior During Therapy: WFL for tasks assessed/performed  Following commands: Intact      Cueing    Exercises      General Comments        Pertinent Vitals/Pain Pain Assessment Pain Assessment: Faces Faces Pain Scale: Hurts little more Pain Location: bil thighs burn during gait    Home Living                          Prior Function            PT Goals (current goals can now be found in the care plan section) Progress  towards PT goals: Progressing toward goals    Frequency    Min 2X/week      PT Plan      Co-evaluation              AM-PAC PT 6 Clicks Mobility   Outcome Measure  Help needed turning from your back to your side while in a flat bed without using bedrails?: A Little Help needed moving from lying on your back to sitting on the side of a flat bed without using bedrails?: A Little Help needed moving to and from a bed to a chair (including a wheelchair)?: A Little Help needed standing up from a chair using your arms (e.g., wheelchair or bedside chair)?: A Little Help needed to walk in hospital room?: A Little Help needed climbing 3-5 steps with a railing? : A Lot 6 Click Score: 17    End of Session Equipment Utilized During Treatment: Gait belt;Oxygen  Activity Tolerance: Patient limited by fatigue Patient left: in bed;with call bell/phone within reach Nurse Communication: Mobility status PT Visit Diagnosis: Unsteadiness on feet (R26.81);Muscle weakness (generalized) (M62.81);Difficulty in walking, not elsewhere classified (R26.2)     Time: 8742-8650 PT Time Calculation (min) (ACUTE ONLY): 52 min  Charges:    $Gait Training: 38-52 mins $Therapeutic Activity: 8-22 mins PT General Charges $$ ACUTE PT VISIT: 1 Visit                     Asberry HERO, PT, DPT  Acute Rehabilitation Secure chat is best for contact #[phone] office    03/21/2024, 2:02 PM

## 2024-03-21 NOTE — Progress Notes (Signed)
 " PROGRESS NOTE    Steven Guzman  FMW:[id number] DOB: [birth year] DOA: 03/12/2024 PCP: Esmeralda Morton SAUNDERS, PA-C   Brief Narrative:   83 yr old man with HTN, COPD, chronic hypoxic respiratory failure, CAD, HFrEF, and VT with ICD presenting with pleuritic chest pain and SOB.  Patient reports a recent hospitalization, posthospitalization remain really weak, developed left-sided chest pain with shortness of breath overnight.  CTA is negative for PE,7.3 x 6.6 cm rounded ill-defined masslike consolidative opacity - pneumonia (vs neoplasm). 4.5 cm diameter ascending thoracic aortic aneurysm.  Was slowly improving until 12/21 patient states he has  worsening breathing and weakness.  He does not feel like he can go home safely with home health as his wife is already in a rehab  Pending placement to rehab.  Assessment & Plan:  Principal Problem:   Pneumonia Active Problems:   Atypical chest pain   Acute on chronic combined systolic and diastolic CHF (congestive heart failure) (HCC)   CAD (coronary artery disease)   Essential hypertension   Hyperlipidemia   ICD (implantable cardioverter-defibrillator) in place   COPD GOLD GRADE C   AF (atrial fibrillation) (HCC)   BPH (benign prostatic hyperplasia)   Anemia   B12 deficiency   Degenerative cervical spinal stenosis   Bilateral sensorineural hearing loss   Chronic idiopathic thrombocytopenia (HCC)   GERD (gastroesophageal reflux disease)   Major depressive disorder with single episode, in partial remission   History of abdominal aortic aneurysm (AAA)   Prediabetes     Possible bacterial multifocal Pneumonia, POA: - Finished course of antibiotics - incentive spirometer and flutter valve - sputum cultures-- appears to be colonized as culture has rare Pseudomonas and Klebsiella - Continue with po steroids. -SLP eval-advised not to use straw - Reviewed CXR which was done on 12/20 and it showed multifocal infiltrates. - Outpatient chest  imaging in about a month to ensure resolution.  Chronic hypoxic RF: continue with 3 L oxygen . Goal oxygen  saturation is 88-92%.   Hyponatremia - Stable   Constipation -bowel regimen   Acute on chronic combined systolic and diastolic CHF (congestive heart failure)  - Stable -TTE 03/04/24 showed EF of 35-40% - Continue with lasix  40 mg PO Daily   Atypical chest pain, resolved   Chronic idiopathic thrombocytopenia   monitoring   Bilateral sensorineural hearing loss Noted   Degenerative cervical spinal stenosis - No current complaint, denies any numbness or tingling, acute on chronic generalized weaknesses -continue with PT   B12 deficiency No acute issues   Anemia Anemia of chronic disease with iron deficiency anemia -Continue ferrous sulfate , H&H stable   BPH (benign prostatic hyperplasia) Monitoring for urinary retention, currently no complaint, continue Flomax    AF (atrial fibrillation), permanent Stable -will continue Coreg , mexiletine, amiodarone , and Eliquis   - Defibrillator in place   COPD GOLD GRADE C -continue with inhalational bronchodilator therapy - Encouraging incentive spirometer, flutter valve, mucolytics   ICD (implantable cardioverter-defibrillator) in place Assessed in place, no recent history of discharge -Patient reports no recent changes.  Stable   Hyperlipidemia - On Repatha    Essential hypertension -hypotensive, holding home heart medications - Currently listed Meds spironolactone , Lasix , Coreg , amiodarone  .. Monitoring BP closely, restarting above meds judiciously     CAD (coronary artery disease) Stable, reviewing home medication resuming according Denies any chest pain   History of abdominal aortic aneurysm (AAA) Noted-stable   Major depressive disorder with single episode, in partial remission Currently stable, continue home medication of Lexapro   GERD (gastroesophageal reflux disease) Continue PPI   Prediabetes Last A1c  5 months ago 5.8 -Due to steroids, anticipating hyperglycemia - SSI coverage  Disposition: Pending SNF placement. He was living at home prior to this hospitalization. His wife is in a SNF.  DVT prophylaxis: SCDs Start: 03/12/24 0808 apixaban  (ELIQUIS ) tablet 5 mg     Code Status: Full Code Family Communication:  None at the bedside Status is: Inpatient Remains inpatient appropriate because: PNA,pending placement    Subjective:  No acute events overnight. Resting comfortably in bed. Awaiting placement to SNF.  Examination:  General exam: Appears calm and comfortable, nasal oxygen  cannula in place Respiratory system: Clear to auscultation. Respiratory effort normal. Cardiovascular system: S1 & S2 heard, RRR. No JVD, murmurs, rubs, gallops or clicks. No pedal edema. Gastrointestinal system: Abdomen is nondistended, soft and nontender. No organomegaly or masses felt. Normal bowel sounds heard. Central nervous system: Alert and oriented. No focal neurological deficits. Extremities: Symmetric 5 x 5 power. Skin: No rashes, lesions or ulcers    Diet Orders (From admission, onward)     Start     Ordered   03/12/24 0808  Diet regular Room service appropriate? Yes; Fluid consistency: Thin  Diet effective now       Question Answer Comment  Room service appropriate? Yes   Fluid consistency: Thin      03/12/24 0809            Objective: Vitals:   03/21/24 0435 03/21/24 0539 03/21/24 0816 03/21/24 0910  BP: 104/65   101/69  Pulse: 63   66  Resp: 17   18  Temp: 97.8 F (36.6 C)   (!) 97.5 F (36.4 C)  TempSrc: Oral     SpO2: 98%  97% 99%  Weight:  65.6 kg    Height:        Intake/Output Summary (Last 24 hours) at 03/21/2024 1453 Last data filed at 03/21/2024 1200 Gross per 24 hour  Intake 843 ml  Output 2450 ml  Net -1607 ml   Filed Weights   03/19/24 0330 03/20/24 0300 03/21/24 0539  Weight: 66.6 kg 65.8 kg 65.6 kg    Scheduled Meds:  acidophilus  1  capsule Oral BID   amiodarone   200 mg Oral Daily   apixaban   5 mg Oral BID   arformoterol   15 mcg Nebulization BID   And   umeclidinium bromide   1 puff Inhalation Daily   aspirin  EC  81 mg Oral Daily   carvedilol   3.125 mg Oral BID   empagliflozin   10 mg Oral Daily   escitalopram   20 mg Oral Daily   feeding supplement  237 mL Oral BID BM   ferrous sulfate    Oral Daily   furosemide   40 mg Oral Daily   guaiFENesin   1,200 mg Oral Q8H   insulin  aspart  0-9 Units Subcutaneous TID WC   lidocaine   1 patch Transdermal Q24H   magnesium  oxide  400 mg Oral Daily   melatonin  3 mg Oral QHS   mexiletine  200 mg Oral BID   nystatin   5 mL Oral QID   pantoprazole   40 mg Oral BID   predniSONE   40 mg Oral Q breakfast   pregabalin   25 mg Oral BID   senna-docusate  1 tablet Oral BID   sodium chloride  flush  3 mL Intravenous Q12H   sodium chloride  flush  3 mL Intravenous Q12H   tamsulosin   0.4 mg Oral Daily   Continuous  Infusions:  Nutritional status     Body mass index is 20.75 kg/m.  Data Reviewed:   CBC: Recent Labs  Lab 03/15/24 0239 03/16/24 0304 03/17/24 0318 03/18/24 0303  WBC 8.3 8.1 8.7 7.2  HGB 9.9* 10.5* 11.0* 10.6*  HCT 32.5* 33.9* 35.6* 34.7*  MCV 95.6 95.8 96.0 95.3  PLT 206 244 259 257   Basic Metabolic Panel: Recent Labs  Lab 03/15/24 0239 03/16/24 0304 03/17/24 0318 03/18/24 0303  NA 133* 132* 133* 134*  K 4.1 4.1 3.9 3.5  CL 96* 96* 95* 95*  CO2 30 31 30  33*  GLUCOSE 111* 107* 120* 139*  BUN 13 14 15 12   CREATININE 0.45* 0.41* 0.46* 0.49*  CALCIUM  8.3* 8.7* 8.4* 8.2*   GFR: Estimated Creatinine Clearance: 62.6 mL/min (A) (by C-G formula based on SCr of 0.49 mg/dL (L)). Liver Function Tests: No results for input(s): AST, ALT, ALKPHOS, BILITOT, PROT, ALBUMIN in the last 168 hours. No results for input(s): LIPASE, AMYLASE in the last 168 hours. No results for input(s): AMMONIA in the last 168 hours. Coagulation Profile: No results  for input(s): INR, PROTIME in the last 168 hours.  Cardiac Enzymes: No results for input(s): CKTOTAL, CKMB, CKMBINDEX, TROPONINI in the last 168 hours. BNP (last 3 results) No results for input(s): PROBNP in the last 8760 hours. HbA1C: No results for input(s): HGBA1C in the last 72 hours. CBG: Recent Labs  Lab 03/20/24 1152 03/20/24 1633 03/20/24 2127 03/21/24 0850 03/21/24 1221  GLUCAP 129* 145* 160* 137* 197*   Lipid Profile: No results for input(s): CHOL, HDL, LDLCALC, TRIG, CHOLHDL, LDLDIRECT in the last 72 hours. Thyroid  Function Tests: No results for input(s): TSH, T4TOTAL, FREET4, T3FREE, THYROIDAB in the last 72 hours. Anemia Panel: No results for input(s): VITAMINB12, FOLATE, FERRITIN, TIBC, IRON, RETICCTPCT in the last 72 hours. Sepsis Labs: No results for input(s): PROCALCITON, LATICACIDVEN in the last 168 hours.   Recent Results (from the past 240 hours)  Culture, blood (routine x 2) Call MD if unable to obtain prior to antibiotics being given     Status: None   Collection Time: 03/12/24  8:11 AM   Specimen: BLOOD  Result Value Ref Range Status   Specimen Description BLOOD RIGHT ANTECUBITAL  Final   Special Requests   Final    BOTTLES DRAWN AEROBIC AND ANAEROBIC Blood Culture results may not be optimal due to an inadequate volume of blood received in culture bottles   Culture   Final    NO GROWTH 5 DAYS Performed at [facility], [address] [place], [place] [address]    Report Status 03/17/2024 FINAL  Final  Culture, blood (routine x 2) Call MD if unable to obtain prior to antibiotics being given     Status: None   Collection Time: 03/12/24 10:59 AM   Specimen: BLOOD RIGHT ARM  Result Value Ref Range Status   Specimen Description BLOOD RIGHT ARM  Final   Special Requests   Final    BOTTLES DRAWN AEROBIC AND ANAEROBIC Blood Culture adequate volume   Culture   Final    NO GROWTH 5  DAYS Performed at [facility], [address] [place], [place] [address]    Report Status 03/17/2024 FINAL  Final  Expectorated Sputum Assessment w Gram Stain, Rflx to Resp Cult     Status: None   Collection Time: 03/14/24  8:12 AM   Specimen: Expectorated Sputum  Result Value Ref Range Status   Specimen  Description EXPECTORATED SPUTUM  Final   Special Requests NONE  Final   Sputum evaluation   Final    THIS SPECIMEN IS ACCEPTABLE FOR SPUTUM CULTURE Performed at [facility], [address], [place], [place] [address]    Report Status 03/15/2024 FINAL  Final  Culture, Respiratory w Gram Stain     Status: None   Collection Time: 03/14/24  8:12 AM  Result Value Ref Range Status   Specimen Description EXPECTORATED SPUTUM  Final   Special Requests NONE Reflexed from [id number]  Final   Gram Stain   Final    FEW WBC PRESENT, PREDOMINANTLY PMN RARE GRAM NEGATIVE RODS RARE GRAM POSITIVE RODS Performed at [facility], [address], [place], [place] [address]    Culture   Final    RARE PSEUDOMONAS AERUGINOSA RARE KLEBSIELLA AEROGENES    Report Status 03/18/2024 FINAL  Final   Organism ID, Bacteria PSEUDOMONAS AERUGINOSA  Final   Organism ID, Bacteria KLEBSIELLA AEROGENES  Final      Susceptibility   Klebsiella aerogenes - MIC*    CEFEPIME <=0.12 SENSITIVE Sensitive     ERTAPENEM <=0.12 SENSITIVE Sensitive     CEFTRIAXONE <=0.25 SENSITIVE Sensitive     CIPROFLOXACIN <=0.06 SENSITIVE Sensitive     GENTAMICIN  <=1 SENSITIVE Sensitive     MEROPENEM <=0.25 SENSITIVE Sensitive     TRIMETH/SULFA <=20 SENSITIVE Sensitive     PIP/TAZO Value in next row Sensitive      <=4 SENSITIVEThis is a modified FDA-approved test that has been validated and its performance characteristics determined by the reporting laboratory.  This laboratory is certified under the Clinical Laboratory Improvement Amendments CLIA as qualified to perform high complexity clinical laboratory testing.    *  RARE KLEBSIELLA AEROGENES   Pseudomonas aeruginosa - MIC*    MEROPENEM Value in next row Sensitive      <=4 SENSITIVEThis is a modified FDA-approved test that has been validated and its performance characteristics determined by the reporting laboratory.  This laboratory is certified under the Clinical Laboratory Improvement Amendments CLIA as qualified to perform high complexity clinical laboratory testing.    CIPROFLOXACIN Value in next row Sensitive      <=4 SENSITIVEThis is a modified FDA-approved test that has been validated and its performance characteristics determined by the reporting laboratory.  This laboratory is certified under the Clinical Laboratory Improvement Amendments CLIA as qualified to perform high complexity clinical laboratory testing.    IMIPENEM Value in next row Sensitive      <=4 SENSITIVEThis is a modified FDA-approved test that has been validated and its performance characteristics determined by the reporting laboratory.  This laboratory is certified under the Clinical Laboratory Improvement Amendments CLIA as qualified to perform high complexity clinical laboratory testing.    PIP/TAZO Value in next row Sensitive      8 SENSITIVEThis is a modified FDA-approved test that has been validated and its performance characteristics determined by the reporting laboratory.  This laboratory is certified under the Clinical Laboratory Improvement Amendments CLIA as qualified to perform high complexity clinical laboratory testing.    CEFEPIME Value in next row Sensitive      8 SENSITIVEThis is a modified FDA-approved test that has been validated and its performance characteristics determined by the reporting laboratory.  This laboratory is certified under the Clinical Laboratory Improvement Amendments CLIA as qualified to perform high complexity clinical laboratory testing.    CEFTAZIDIME/AVIBACTAM Value in next row Sensitive  8 SENSITIVEThis is a modified FDA-approved test that has  been validated and its performance characteristics determined by the reporting laboratory.  This laboratory is certified under the Clinical Laboratory Improvement Amendments CLIA as qualified to perform high complexity clinical laboratory testing.    CEFTOLOZANE/TAZOBACTAM Value in next row Sensitive      8 SENSITIVEThis is a modified FDA-approved test that has been validated and its performance characteristics determined by the reporting laboratory.  This laboratory is certified under the Clinical Laboratory Improvement Amendments CLIA as qualified to perform high complexity clinical laboratory testing.    TOBRAMYCIN Value in next row Sensitive      8 SENSITIVEThis is a modified FDA-approved test that has been validated and its performance characteristics determined by the reporting laboratory.  This laboratory is certified under the Clinical Laboratory Improvement Amendments CLIA as qualified to perform high complexity clinical laboratory testing.    CEFTAZIDIME Value in next row Sensitive      8 SENSITIVEThis is a modified FDA-approved test that has been validated and its performance characteristics determined by the reporting laboratory.  This laboratory is certified under the Clinical Laboratory Improvement Amendments CLIA as qualified to perform high complexity clinical laboratory testing.    * RARE PSEUDOMONAS AERUGINOSA         Radiology Studies: No results found.          LOS: 9 days   Time spent= 35 mins    Deliliah Room, MD [facility]  If 7PM-7AM, please contact night-coverage  03/21/2024, 2:53 PM  "

## 2024-03-22 DIAGNOSIS — J150 Pneumonia due to Klebsiella pneumoniae: Secondary | ICD-10-CM | POA: Diagnosis not present

## 2024-03-22 LAB — GLUCOSE, CAPILLARY
Glucose-Capillary: 161 mg/dL — ABNORMAL HIGH (ref 70–99)
Glucose-Capillary: 143 mg/dL — ABNORMAL HIGH (ref 70–99)
Glucose-Capillary: 163 mg/dL — ABNORMAL HIGH (ref 70–99)
Glucose-Capillary: 158 mg/dL — ABNORMAL HIGH (ref 70–99)

## 2024-03-22 MED ORDER — PREDNISONE 10 MG PO TABS: 10.0000 mg | ORAL_TABLET | Freq: Every day | ORAL | Status: DC

## 2024-03-22 MED ORDER — PREDNISONE 20 MG PO TABS
30.0000 mg | ORAL_TABLET | Freq: Every day | ORAL | Status: AC
  Administered 2024-03-23 – 2024-03-24 (×2): 30 mg via ORAL
  Filled 2024-03-22 (×2): qty 1

## 2024-03-22 MED ORDER — PREDNISONE 20 MG PO TABS: 20.0000 mg | ORAL_TABLET | Freq: Every day | ORAL | Status: DC

## 2024-03-22 NOTE — Progress Notes (Signed)
 Physical Therapy Treatment Patient Details Name: Steven Guzman MRN: [id number] DOB: 10/11/1940 Today's Date: 03/22/2024   History of Present Illness 83 yo M adm 12/14 with SOB and CP. Pt with workup indicating COPD exacerbation, lung mass, and PNA. PMH includes: COPD, CHF, CAD MI, aortic aneurysm, ACDF,  chronic neck/back pain, anemia, depression, chronic idiopathic thrombocytopenia,    PT Comments  Pt making steady progress but continues to require assist with all mobility and has decr functional activity tolerance. Needs to be independent to return home. Patient will benefit from continued inpatient follow up therapy, <3 hours/day.    If plan is discharge home, recommend the following: A little help with walking and/or transfers;A little help with bathing/dressing/bathroom;Assistance with cooking/housework;Assist for transportation;Help with stairs or ramp for entrance   Can travel by private vehicle     Yes  Equipment Recommendations  Rollator (4 wheels);Other (comment) (home O2)    Recommendations for Other Services       Precautions / Restrictions Precautions Precautions: Fall;Other (comment) Precaution/Restrictions Comments: watch O2     Mobility  Bed Mobility Overal bed mobility: Needs Assistance Bed Mobility: Supine to Sit, Sit to Supine     Supine to sit: HOB elevated, Used rails, Supervision Sit to supine: HOB elevated, Used rails, Supervision   General bed mobility comments: Incr time and effort    Transfers Overall transfer level: Needs assistance Equipment used: Rollator (4 wheels) Transfers: Sit to/from Stand Sit to Stand: Min assist           General transfer comment: Assist to power up and for balance    Ambulation/Gait Ambulation/Gait assistance: Contact guard assist Gait Distance (Feet): 75 Feet (x 2) Assistive device: Straight cane Gait Pattern/deviations: Shuffle, Trunk flexed, Step-through pattern, Decreased stride length Gait  velocity: decreased Gait velocity interpretation: 1.31 - 2.62 ft/sec, indicative of limited community ambulator   General Gait Details: Assist for balance and support. Pt with sitting rest break on rollator.   Stairs             Wheelchair Mobility     Tilt Bed    Modified Rankin (Stroke Patients Only)       Balance Overall balance assessment: Needs assistance Sitting-balance support: Feet supported, No upper extremity supported Sitting balance-Leahy Scale: Good     Standing balance support: Single extremity supported Standing balance-Leahy Scale: Poor Standing balance comment: UE support                            Communication Communication Communication: Impaired Factors Affecting Communication: Hearing impaired  Cognition Arousal: Alert Behavior During Therapy: WFL for tasks assessed/performed   PT - Cognitive impairments: Attention, Awareness, Safety/Judgement                       PT - Cognition Comments: Needs cues for pacing and to stop talking to catch his breath Following commands: Intact      Cueing Cueing Techniques: Verbal cues  Exercises      General Comments General comments (skin integrity, edema, etc.): Pt on 3L with SpO2 96%      Pertinent Vitals/Pain Pain Assessment Pain Assessment: Faces Faces Pain Scale: No hurt    Home Living                          Prior Function            PT Goals (  current goals can now be found in the care plan section) Progress towards PT goals: Progressing toward goals    Frequency    Min 2X/week      PT Plan      Co-evaluation              AM-PAC PT 6 Clicks Mobility   Outcome Measure  Help needed turning from your back to your side while in a flat bed without using bedrails?: A Little Help needed moving from lying on your back to sitting on the side of a flat bed without using bedrails?: A Little Help needed moving to and from a bed to a chair  (including a wheelchair)?: A Little Help needed standing up from a chair using your arms (e.g., wheelchair or bedside chair)?: A Little Help needed to walk in hospital room?: A Little Help needed climbing 3-5 steps with a railing? : A Lot 6 Click Score: 17    End of Session Equipment Utilized During Treatment: Gait belt;Oxygen  Activity Tolerance: Patient limited by fatigue Patient left: in bed;with call bell/phone within reach;with bed alarm set Nurse Communication: Mobility status PT Visit Diagnosis: Unsteadiness on feet (R26.81);Muscle weakness (generalized) (M62.81);Difficulty in walking, not elsewhere classified (R26.2)     Time: 8551-8491 PT Time Calculation (min) (ACUTE ONLY): 20 min  Charges:    $Gait Training: 8-22 mins PT General Charges $$ ACUTE PT VISIT: 1 Visit                     [facility] PT Acute Rehabilitation Services Office [phone]    [facility] ORN [facility] 03/22/2024, 4:23 PM

## 2024-03-22 NOTE — Progress Notes (Signed)
 Mobility Specialist Progress Note:    03/22/24 0938  Mobility  Activity Ambulated with assistance (In hallway)  Level of Assistance Contact guard assist, steadying assist  Assistive Device Four wheel walker  Distance Ambulated (ft) 75 ft  Activity Response Tolerated well  Mobility Referral Yes  Mobility visit 1 Mobility  Mobility Specialist Start Time (ACUTE ONLY) 0912  Mobility Specialist Stop Time (ACUTE ONLY) [id number]  Mobility Specialist Time Calculation (min) (ACUTE ONLY) 26 min   Received pt in bed and agreeable to mobility. Found pt on 3 L/min O2. Pt required MinG for safety. Pt desat to 80% SPO2, increased O2 to 4 L/min. Pt had 1 seated rest break d/t SOB. Pt's SPO2 was at 94%. Decreased O2 rate to 3 L/min. O2 WFL. Returned to room without fault. Left pt in bed with alarm on. Personal belongings and call light within reach. All needs met.  Lavanda Pollack Mobility Specialist  Please contact via [employer] or  Rehab Office [phone]

## 2024-03-22 NOTE — Progress Notes (Signed)
 Speech Language Pathology Treatment: Dysphagia  Patient Details Name: Steven Guzman MRN: [id number] DOB: February 20, 1941 Today's Date: 03/22/2024 Time: 8576-8549 SLP Time Calculation (min) (ACUTE ONLY): 27 min  Assessment / Plan / Recommendation Clinical Impression  Reviewed pt's goals to continue regular diet with thin liquids using strategies. He recalled recommendation to avoid straws and perform oral care BID/before PO intake. Cueing was needed to remind him to take pills with pudding/yogurt/applesauce. No coughing occurred with consecutive cup sips of soda. He reports endurance during meals has improved but he is still having trouble with particulate solids. Reviewed strategies, providing printed handouts for respiratory status and its impact on swallowing as well as strategies to minimize adverse reactions to aspiration. Pt demonstrates understanding of all education.   PLAN: Continue current diet of regular solids with thin liquids via cup only (no straws). Encourage frequent oral care, particularly before PO intake. Take pills whole with puree. SLP will sign off acutely but ongoing f/u at his next venue of care is recommended to reinforce education.    HPI HPI: 83 yo male presenting to ED 12/14 with pleuritic chest pain and SOB. Admitted with COPD exacerbation and pneumonia. CTA shows rounded ill-defined mass-like consolidative opacity, compatible with pneumonia with additional ill-defined nodular opacities identified in the RLL with evidence of peripheral small airway impaction and a moderate hiatal hernia. Seen by SLP 03/04/24 with functional appearing swallowing and for dysphonia 08/03/22. Stroboscopy 08/19/22 shows mild diffuse erythema, minimal posterior laryngeal edema, preserved vocal fold mobility with midfold atrophy, resulting in incomplete glottic closure. ENT confirms the presence of tonsilloliths, which are bothersome to the pt but that he does not wish to remove. PMH: COPD, CHF, CAD,  GERD, MI, aortic aneurysm, ACDF, chronic neck/back pain, anemia, depression, idiopathic thrombocytopenia      SLP Plan  All goals met        Swallow Evaluation Recommendations   Recommendations: PO diet PO Diet Recommendation: Regular;Thin liquids (Level 0) Liquid Administration via: Cup;No straw Medication Administration: Whole meds with puree Supervision: Patient able to self-feed;Full supervision/cueing for swallowing strategies Postural changes: Position pt fully upright for meals Oral care recommendations: Oral care BID (2x/day);Oral care before PO     Recommendations                     Oral care BID;Oral care before and after PO   Frequent or constant Supervision/Assistance Dysphagia, pharyngeal phase (R13.13)     All goals met     [name], M.A., CCC-SLP Speech Language Pathology, Acute Rehabilitation Services  Secure Chat preferred [phone]   03/22/2024, 3:18 PM

## 2024-03-22 NOTE — Progress Notes (Signed)
 " PROGRESS NOTE Steven Guzman  FMW:[id number] DOB: 07-27-1940 DOA: 03/12/2024 PCP: Esmeralda Morton SAUNDERS, PA-C  Brief Narrative/Hospital Course: Steven Guzman is a 83 y.o. male with PMH of  HTN, COPD, chronic hypoxic respiratory failure, CAD, HFrEF, and VT with ICD presenting with pleuritic chest pain and shortness of breath. Patient reports a recent hospitalization, posthospitalization remain really weak, developed left-sided chest pain with shortness of breath overnight.  CTA is negative for PE,7.3 x 6.6 cm rounded ill-defined masslike consolidative opacity - pneumonia (vs neoplasm). 4.5 cm diameter ascending thoracic aortic aneurysm.  Was slowly improving until 12/21 patient states he has  worsening breathing and weakness AND felt like he can go home safely with home health as his wife is already in a rehab. At this time waiting on placement to rehab  Subjective: Seen and examined today On 3l [place] spo2 low on walking Feels nervous this am, may be lightheaded- thinks it could be sth he ate. afebrile, VSS, Labs reviewed from 12/19 with stable renal function and chronic anemia CBG STABLE  On ambulation yesterday saturation up to 88% needing 3 to nasal cannula while ambulation and at rest 93% on RA  Assessment and plan:  Possible bacterial multifocal Pneumonia, POA: Completed antibiotic course.  Needing supplemental oxygen  which he had since his last discharge sputum cultures-- appears to be colonized as culture has rare Pseudomonas and Klebsiella Remains afebrile, encourage incentive spirometry PT OT ambulation. Continue with po steroids. SLP eval completed advised not to use a straw. Repeat chest x-ray 12/20 showed multifocal infiltrates. He does have 7.3 x 6.6 cm rounded ill-defined masslike consolidative opacity in the posterior left upper lobe. Imaging features are compatible with pneumonia although neoplasm is not excluded and close follow-up Warranted> this was discussed with the  patient and he will need follow-up imaging soon ON D/C W/ A MONTH.   Chronic hypoxic respiratory failure : continue with 3 L oxygen . Goal oxygen  saturation is 88-92%.  Continue pulmonary toileting I-S   Hyponatremia Mild, stable    Constipation Having bowel movement continue stool softener    Acute on chronic combined systolic and diastolic CHF TTE 87/3/74 showed EF of 35-40%. Continue with lasix  40 mg PO Daily  Net IO Since Admission: -83,256.19 mL [03/22/24 1044]   Atypical chest pain: resolved  AF, permanent s/p d fib ICD: Stable -will continue Coreg , mexiletine, amiodarone , and Eliquis .  Monitor on telemetry   Hyperlipidemia : On Repatha    Essential hypertension hypotensive, holding home heart medications Currently BP remains stable in 110 range.  On low-dose Coreg  3.125, oral Lasix , continue the same   CAD: Denies any chest pain.  Continue her aspirin  81, Coreg    History of abdominal aortic aneurysm: Noted-stable  Chronic idiopathic thrombocytopenia  Last platelet count normal in 250s    Bilateral sensorineural hearing loss: Stable   Degenerative cervical spinal stenosis No current complaint, denies any numbness or tingling, acute on chronic generalized weaknesses continue with PT   B12 deficiency No acute issues   Anemia of chronic disease with iron deficiency anemia: Stable, continue ferrous sulfate , H&H stable   BPH: Monitoring for urinary retention, currently no complaint, continue Flomax    COPD GOLD GRADE C: continue with inhalational bronchodilator therapy Encouraging incentive spirometer, flutter valve, mucolytics.  She follows with pulmonary as outpatient    Major depressive disorder with single episode, in partial remission Currently stable, continue home medication of Lexapro    GERD: Continue PPI   Prediabetes Last A1c 5 months ago 5.8.  Blood sugar fairly controlled keep on SSI   Mobility: PT Orders: Active PT Follow up Rec: Home  Health Pt12/23/2025 1357   DVT prophylaxis: SCDs Start: 03/12/24 9191 Code Status:   Code Status: Full Code Family Communication: plan of care discussed with patient at bedside. Patient status is: Remains hospitalized because of severity of illness Level of care: Telemetry   Dispo: The patient is from: home            Anticipated disposition: Awaiting for SNF, his wife is at the SNF, unable to return home   Objective: Vitals last 24 hrs: Vitals:   03/21/24 2055 03/21/24 2112 03/22/24 0439 03/22/24 0500  BP:  112/65 113/61   Pulse:  80 64   Resp:  17 16   Temp:  97.8 F (36.6 C) 98.1 F (36.7 C)   TempSrc:  Oral    SpO2: 95% 94% 99%   Weight:    65.2 kg  Height:        Physical Examination: General exam: alert awake, oriented, older than stated age HEENT:Oral mucosa moist, Ear/Nose WNL grossly Respiratory system: Bilaterally clear BS,no use of accessory muscle Cardiovascular system: S1 & S2 +, No JVD. Gastrointestinal system: Abdomen soft,NT,ND, BS+ Nervous System: Alert, awake, moving all extremities,and following commands. Extremities: extremities warm, leg edema neg Skin: Warm, no rashes MSK: Normal muscle bulk,tone, power   Medications reviewed:  Scheduled Meds:  acidophilus  1 capsule Oral BID   amiodarone   200 mg Oral Daily   apixaban   5 mg Oral BID   arformoterol   15 mcg Nebulization BID   And   umeclidinium bromide   1 puff Inhalation Daily   aspirin  EC  81 mg Oral Daily   carvedilol   3.125 mg Oral BID   empagliflozin   10 mg Oral Daily   escitalopram   20 mg Oral Daily   feeding supplement  237 mL Oral BID BM   ferrous sulfate    Oral Daily   furosemide   40 mg Oral Daily   guaiFENesin   1,200 mg Oral Q8H   insulin  aspart  0-9 Units Subcutaneous TID WC   lidocaine   1 patch Transdermal Q24H   magnesium  oxide  400 mg Oral Daily   melatonin  3 mg Oral QHS   mexiletine  200 mg Oral BID   nystatin   5 mL Oral QID   pantoprazole   40 mg Oral BID   predniSONE   40  mg Oral Q breakfast   pregabalin   25 mg Oral BID   senna-docusate  1 tablet Oral BID   sodium chloride  flush  3 mL Intravenous Q12H   sodium chloride  flush  3 mL Intravenous Q12H   tamsulosin   0.4 mg Oral Daily   Continuous Infusions: Diet: Diet Order             Diet regular Room service appropriate? Yes; Fluid consistency: Thin  Diet effective now                   Data Reviewed: I have personally reviewed following labs and imaging studies ( see epic result tab) CBC: Recent Labs  Lab 03/16/24 0304 03/17/24 0318 03/18/24 0303  WBC 8.1 8.7 7.2  HGB 10.5* 11.0* 10.6*  HCT 33.9* 35.6* 34.7*  MCV 95.8 96.0 95.3  PLT 244 259 257   CMP: Recent Labs  Lab 03/16/24 0304 03/17/24 0318 03/18/24 0303  NA 132* 133* 134*  K 4.1 3.9 3.5  CL 96* 95* 95*  CO2 31 30 33*  GLUCOSE 107* 120* 139*  BUN 14 15 12   CREATININE 0.41* 0.46* 0.49*  CALCIUM  8.7* 8.4* 8.2*   GFR: Estimated Creatinine Clearance: 62.3 mL/min (A) (by C-G formula based on SCr of 0.49 mg/dL (L)). No results for input(s): AST, ALT, ALKPHOS, BILITOT, PROT, ALBUMIN in the last 168 hours. No results for input(s): LIPASE, AMYLASE in the last 168 hours. No results for input(s): AMMONIA in the last 168 hours. Coagulation Profile: No results for input(s): INR, PROTIME in the last 168 hours. Unresulted Labs (From admission, onward)    None      Antimicrobials/Microbiology: Anti-infectives (From admission, onward)    Start     Dose/Rate Route Frequency Ordered Stop   03/14/24 0600  Ampicillin -Sulbactam (UNASYN ) 3 g in sodium chloride  0.9 % 100 mL IVPB        3 g 200 mL/hr over 30 Minutes Intravenous Every 6 hours 03/14/24 0116 03/18/24 1753   03/12/24 1015  Ampicillin -Sulbactam (UNASYN ) 3 g in sodium chloride  0.9 % 100 mL IVPB  Status:  Discontinued        3 g 200 mL/hr over 30 Minutes Intravenous Every 6 hours 03/12/24 1008 03/14/24 0116   03/12/24 1000  doxycycline  (VIBRA -TABS) tablet 100  mg        100 mg Oral Every 12 hours 03/12/24 0953 03/18/24 2359   03/12/24 0815  levofloxacin  (LEVAQUIN ) IVPB 750 mg  Status:  Discontinued        750 mg 100 mL/hr over 90 Minutes Intravenous Every 24 hours 03/12/24 0807 03/12/24 0812   03/12/24 0815  levofloxacin  (LEVAQUIN ) IVPB 750 mg  Status:  Discontinued        750 mg 100 mL/hr over 90 Minutes Intravenous Every 24 hours 03/12/24 0812 03/12/24 0822   03/12/24 0630  vancomycin  (VANCOCIN ) IVPB 1000 mg/200 mL premix        1,000 mg 200 mL/hr over 60 Minutes Intravenous  Once 03/12/24 0620 03/12/24 0757   03/12/24 0445  azithromycin  (ZITHROMAX ) 500 mg in sodium chloride  0.9 % 250 mL IVPB        500 mg 250 mL/hr over 60 Minutes Intravenous  Once 03/12/24 0442 03/12/24 0640         Component Value Date/Time   SDES EXPECTORATED SPUTUM 03/14/2024 0812   SDES EXPECTORATED SPUTUM 03/14/2024 0812   SPECREQUEST NONE 03/14/2024 0812   SPECREQUEST NONE Reflexed from [id number] 03/14/2024 0812   CULT  03/14/2024 0812    RARE PSEUDOMONAS AERUGINOSA RARE KLEBSIELLA AEROGENES    REPTSTATUS 03/15/2024 FINAL 03/14/2024 0812   REPTSTATUS 03/18/2024 FINAL 03/14/2024 9187   Procedures:  Mennie LAMY, MD [facility] 03/22/2024, 10:49 AM   "

## 2024-03-22 NOTE — Plan of Care (Signed)
" °  Problem: Education: Goal: Knowledge of General Education information will improve Description: Including pain rating scale, medication(s)/side effects and non-pharmacologic comfort measures Outcome: Progressing   Problem: Health Behavior/Discharge Planning: Goal: Ability to manage health-related needs will improve Outcome: Progressing   Problem: Activity: Goal: Risk for activity intolerance will decrease Outcome: Progressing   Problem: Nutrition: Goal: Adequate nutrition will be maintained Outcome: Progressing   Problem: Elimination: Goal: Will not experience complications related to urinary retention Outcome: Progressing   Problem: Safety: Goal: Ability to remain free from injury will improve Outcome: Progressing   "

## 2024-03-23 DIAGNOSIS — J150 Pneumonia due to Klebsiella pneumoniae: Secondary | ICD-10-CM | POA: Diagnosis not present

## 2024-03-23 LAB — CBC
HCT: 34.6 % — ABNORMAL LOW (ref 39.0–52.0)
Hemoglobin: 10.7 g/dL — ABNORMAL LOW (ref 13.0–17.0)
MCH: 29.6 pg (ref 26.0–34.0)
MCHC: 30.9 g/dL (ref 30.0–36.0)
MCV: 95.8 fL (ref 80.0–100.0)
Platelets: 239 K/uL (ref 150–400)
RBC: 3.61 MIL/uL — ABNORMAL LOW (ref 4.22–5.81)
RDW: 19.7 % — ABNORMAL HIGH (ref 11.5–15.5)
WBC: 7.3 K/uL (ref 4.0–10.5)
nRBC: 0.3 % — ABNORMAL HIGH (ref 0.0–0.2)

## 2024-03-23 LAB — BASIC METABOLIC PANEL WITH GFR
Anion gap: 5 (ref 5–15)
BUN: 15 mg/dL (ref 8–23)
CO2: 35 mmol/L — ABNORMAL HIGH (ref 22–32)
Calcium: 8.3 mg/dL — ABNORMAL LOW (ref 8.9–10.3)
Chloride: 92 mmol/L — ABNORMAL LOW (ref 98–111)
Creatinine, Ser: 0.52 mg/dL — ABNORMAL LOW (ref 0.61–1.24)
GFR, Estimated: >60 mL/min
Glucose, Bld: 108 mg/dL — ABNORMAL HIGH (ref 70–99)
Potassium: 4.2 mmol/L (ref 3.5–5.1)
Sodium: 132 mmol/L — ABNORMAL LOW (ref 135–145)

## 2024-03-23 LAB — GLUCOSE, CAPILLARY
Glucose-Capillary: 101 mg/dL — ABNORMAL HIGH (ref 70–99)
Glucose-Capillary: 145 mg/dL — ABNORMAL HIGH (ref 70–99)
Glucose-Capillary: 98 mg/dL (ref 70–99)
Glucose-Capillary: 138 mg/dL — ABNORMAL HIGH (ref 70–99)

## 2024-03-23 MED ORDER — POLYVINYL ALCOHOL 1.4 % OP SOLN: 1.0000 [drp] | OPHTHALMIC | Status: DC | PRN

## 2024-03-23 NOTE — TOC Progression Note (Signed)
 Transition of Care [facility]) - Progression Note    Patient Details  Name: Steven Guzman MRN: [id number] Date of Birth: April 30, 1940  Transition of Care [facility]) CM/SW Contact  [name], [place] Phone Number: 03/23/2024, 9:37 AM  Clinical Narrative:     Per [employer] Portal: peer to peer requested -Provider to call: [phone] Option 5. Deadline is: 03/24/24 11:00 AM   Attending made aware via Epic chat.   Expected Discharge Plan: Skilled Nursing Facility Barriers to Discharge: Continued Medical Work up, SNF Pending bed offer, English As A Second Language Teacher               Expected Discharge Plan and Services In-house Referral: NA Discharge Planning Services: CM Consult Post Acute Care Choice: Skilled Nursing Facility Living arrangements for the past 2 months: Single Family Home                 DME Arranged: N/A DME Agency: NA       HH Arranged: RN, PT, OT HH Agency: [facility] Date HH Agency Contacted: 03/14/24 Time HH Agency Contacted: 1654 Representative spoke with at [facility] Agency: [place]   Social Drivers of Health (SDOH) Interventions SDOH Screenings   Food Insecurity: No Food Insecurity (03/13/2024)  Housing: Low Risk (03/13/2024)  Transportation Needs: No Transportation Needs (03/13/2024)  Utilities: Not At Risk (03/13/2024)  Alcohol  Screen: Low Risk (03/06/2021)  Depression (PHQ2-9): Low Risk (03/12/2022)  Financial Resource Strain: Medium Risk (03/06/2021)  Physical Activity: Insufficiently Active (03/06/2021)  Social Connections: Moderately Isolated (03/13/2024)  Stress: No Stress Concern Present (12/27/2021)   Received from [facility], [facility] visits prior to 05/30/2022.  Tobacco Use: High Risk (03/12/2024)    Readmission Risk Interventions    03/14/2024    4:52 PM  Readmission Risk Prevention Plan  Transportation Screening Complete  Medication Review (RN Care Manager) Complete  HRI or Home Care Consult  Complete  Palliative Care Screening Not Applicable  Skilled Nursing Facility Not Applicable

## 2024-03-23 NOTE — Progress Notes (Signed)
 Mobility Specialist Progress Note:    03/23/24 0956  Mobility  Activity Ambulated with assistance (In hallway)  Level of Assistance Contact guard assist, steadying assist  Assistive Device Four wheel walker  Distance Ambulated (ft) 75 ft  Activity Response Tolerated well  Mobility Referral Yes  Mobility visit 1 Mobility  Mobility Specialist Start Time (ACUTE ONLY) 0941  Mobility Specialist Stop Time (ACUTE ONLY) 0956  Mobility Specialist Time Calculation (min) (ACUTE ONLY) 15 min   Received pt in bed and agreeable to mobility. Found pt on 3 L/min O2. Pt required MinG for safety. Pt desat to 80% SPO2, increased O2 to 4 L/min. Pt had 1 seated rest break d/t SOB. Pt's SPO2 was at 94%. Decreased O2 rate to 3 L/min. O2 WFL. Returned to room without fault. Left pt in bed with alarm on. Personal belongings and call light within reach. All needs met.   Lavanda Pollack Mobility Specialist  Please contact via [employer] or  Rehab Office [phone]

## 2024-03-23 NOTE — Plan of Care (Signed)
" °  Problem: Fluid Volume: Goal: Ability to maintain a balanced intake and output will improve Outcome: Progressing   Problem: Health Behavior/Discharge Planning: Goal: Ability to manage health-related needs will improve Outcome: Progressing   Problem: Nutritional: Goal: Maintenance of adequate nutrition will improve Outcome: Progressing   Problem: Education: Goal: Knowledge of General Education information will improve Description: Including pain rating scale, medication(s)/side effects and non-pharmacologic comfort measures Outcome: Progressing   Problem: Safety: Goal: Ability to remain free from injury will improve Outcome: Progressing   "

## 2024-03-23 NOTE — Progress Notes (Signed)
 " PROGRESS NOTE Steven Guzman  FMW:[id number] DOB: 01-03-1941 DOA: 03/12/2024 PCP: Esmeralda Morton SAUNDERS, PA-C  Brief Narrative/Hospital Course: Steven Guzman is a 83 y.o. male with PMH of  HTN, COPD, chronic hypoxic respiratory failure, CAD, HFrEF, and VT with ICD presenting with pleuritic chest pain and shortness of breath. Patient reports a recent hospitalization, posthospitalization remain really weak, developed left-sided chest pain with shortness of breath overnight.  CTA is negative for PE,7.3 x 6.6 cm rounded ill-defined masslike consolidative opacity - pneumonia (vs neoplasm). 4.5 cm diameter ascending thoracic aortic aneurysm.  Was slowly improving until 12/21 patient states he has  worsening breathing and weakness AND felt like he can go home safely with home health as his wife is already in a rehab. At this time waiting on placement to rehab.  Subjective: Seen and examined  Patient reports she slept well denies shortness of breath chest pain fever chills Overnight remains afebrile, on 3 L [place],  VSS, Labs reviewed-stable renal function hemoglobin remained stable  at 10   Assessment and plan:  Possible bacterial multifocal Pneumonia, POA 7.3 x 6.6 cm rounded ill-defined masslike consolidative opacity: Completed antibiotic course. sputum cultures-- appears to be colonized as culture has rare Pseudomonas and Klebsiella Overall stable no leukocytosis, no fever. Continue home supplemental oxygen -using since last discharge.  Continue PT OT, IS, Pulm toileting. Continue oral steroid taper-ordered. SLP eval completed advised not to use a straw. chest x-ray 12/20 showed multifocal infiltrates. He does have 7.3 x 6.6 cm rounded ill-defined masslike consolidative opacity in the posterior left upper lobe. Imaging features are compatible with pneumonia although neoplasm is not excluded and close follow-up-this was discussed with the patient and he will need follow-up imaging soon ON D/C within a  month.  Chronic hypoxic respiratory failure : continue with 3 L oxygen . Goal oxygen  saturation is 88-92%.  Continue pulmonary toileting I-S   Hyponatremia Mild, stable    Constipation Improved, continue stool softener    Acute on chronic combined systolic and diastolic CHF TTE 87/3/74 showed EF of 35-40%. Continue with lasix  40 mg PO Daily  Net IO Since Admission: -17,408.19 mL [03/23/24 1050]   Atypical chest pain: resolved  AF, permanent s/p d fib ICD: Stable, rate controlled- continue Coreg , mexiletine, amiodarone , and Eliquis .  Monitor on telemetry   Hyperlipidemia : On Repatha    Essential hypertension hypotensive, holding home heart medications stable bp on low-dose Coreg  3.125, oral Lasix    CAD: Denies any chest pain.  Continue her aspirin  81, Coreg    History of abdominal aortic aneurysm: Noted-stable  Chronic idiopathic thrombocytopenia  Last platelet count normal in 250s    Bilateral sensorineural hearing loss: Stable   Degenerative cervical spinal stenosis No current complaint, denies any numbness or tingling, acute on chronic generalized weaknesses continue with PT   B12 deficiency No acute issues   Anemia of chronic disease with iron deficiency anemia: Stable, continue ferrous sulfate , H&H stable overall.  Follow-up outpatient   BPH: Monitoring for urinary retention, currently no complaint, continue Flomax    COPD GOLD grade C: Stable, inhalational bronchodilator therapy. Encouraging incentive spirometer, flutter valve, mucolytics.    Major depressive disorder with single episode, in partial remission Mood stable, continue home medication of Lexapro    GERD: Continue PPI   Prediabetes Last A1c 5 months ago 5.8.  Blood sugar fairly controlled keep on SSI   Mobility: PT Orders: Active PT Follow up Rec: Skilled Nursing-Short Term Rehab (<3 Hours/Day)03/22/2024 1618   DVT prophylaxis: SCDs Start: 03/12/24  9191 Code Status:   Code Status: Full  Code Family Communication: plan of care discussed with patient at bedside. Patient status is: Remains hospitalized because of severity of illness Level of care: Telemetry   Dispo: The patient is from: home            Anticipated disposition: Awaiting for SNF, his wife is at the SNF, unable to return home .awaiting to complete  P2P -peer to peer requested -Provider to call: [phone] Option 5. Deadline is: 03/24/24 11:00 AM - [employer] office is closed today  Objective: Vitals last 24 hrs: Vitals:   03/23/24 0500 03/23/24 0646 03/23/24 0824 03/23/24 1042  BP:  109/70  109/75  Pulse:  67 67 71  Resp:  16 17 18   Temp:  97.8 F (36.6 C)  98 F (36.7 C)  TempSrc:  Oral  Oral  SpO2:  99% 97% 93%  Weight: 65.5 kg     Height:        Physical Examination: General exam: AAOX3.  Pleasant HEENT:Oral mucosa moist, Ear/Nose WNL grossly Respiratory system: CTA Bilaterally Cardiovascular system: S1 & S2 +, No JVD. Gastrointestinal system: Abdomen soft,NT,ND, BS+ Nervous System: Alert, awake, non focal Extremities: extremities warm, leg edema neg Skin: Warm, no rashes MSK: Normal muscle bulk,tone, power   Medications reviewed:  Scheduled Meds:  acidophilus  1 capsule Oral BID   amiodarone   200 mg Oral Daily   apixaban   5 mg Oral BID   arformoterol   15 mcg Nebulization BID   And   umeclidinium bromide   1 puff Inhalation Daily   aspirin  EC  81 mg Oral Daily   carvedilol   3.125 mg Oral BID   empagliflozin   10 mg Oral Daily   escitalopram   20 mg Oral Daily   feeding supplement  237 mL Oral BID BM   ferrous sulfate    Oral Daily   furosemide   40 mg Oral Daily   guaiFENesin   1,200 mg Oral Q8H   insulin  aspart  0-9 Units Subcutaneous TID WC   lidocaine   1 patch Transdermal Q24H   magnesium  oxide  400 mg Oral Daily   melatonin  3 mg Oral QHS   mexiletine  200 mg Oral BID   nystatin   5 mL Oral QID   pantoprazole   40 mg Oral BID   predniSONE   30 mg Oral Q breakfast   Followed  by   NOREEN ON 03/25/2024] predniSONE   20 mg Oral Q breakfast   Followed by   NOREEN ON 03/27/2024] predniSONE   10 mg Oral Q breakfast   pregabalin   25 mg Oral BID   senna-docusate  1 tablet Oral BID   sodium chloride  flush  3 mL Intravenous Q12H   sodium chloride  flush  3 mL Intravenous Q12H   tamsulosin   0.4 mg Oral Daily   Continuous Infusions: Diet: Diet Order             Diet regular Room service appropriate? Yes; Fluid consistency: Thin  Diet effective now                   Data Reviewed: I have personally reviewed following labs and imaging studies ( see epic result tab) CBC: Recent Labs  Lab 03/17/24 0318 03/18/24 0303 03/23/24 0234  WBC 8.7 7.2 7.3  HGB 11.0* 10.6* 10.7*  HCT 35.6* 34.7* 34.6*  MCV 96.0 95.3 95.8  PLT 259 257 239   CMP: Recent Labs  Lab 03/17/24 0318 03/18/24 0303 03/23/24 0234  NA  133* 134* 132*  K 3.9 3.5 4.2  CL 95* 95* 92*  CO2 30 33* 35*  GLUCOSE 120* 139* 108*  BUN 15 12 15   CREATININE 0.46* 0.49* 0.52*  CALCIUM  8.4* 8.2* 8.3*   GFR: Estimated Creatinine Clearance: 62.5 mL/min (A) (by C-G formula based on SCr of 0.52 mg/dL (L)). No results for input(s): AST, ALT, ALKPHOS, BILITOT, PROT, ALBUMIN in the last 168 hours. No results for input(s): LIPASE, AMYLASE in the last 168 hours. No results for input(s): AMMONIA in the last 168 hours. Coagulation Profile: No results for input(s): INR, PROTIME in the last 168 hours. Unresulted Labs (From admission, onward)    None      Antimicrobials/Microbiology: Anti-infectives (From admission, onward)    Start     Dose/Rate Route Frequency Ordered Stop   03/14/24 0600  Ampicillin -Sulbactam (UNASYN ) 3 g in sodium chloride  0.9 % 100 mL IVPB        3 g 200 mL/hr over 30 Minutes Intravenous Every 6 hours 03/14/24 0116 03/18/24 1753   03/12/24 1015  Ampicillin -Sulbactam (UNASYN ) 3 g in sodium chloride  0.9 % 100 mL IVPB  Status:  Discontinued        3 g 200 mL/hr over  30 Minutes Intravenous Every 6 hours 03/12/24 1008 03/14/24 0116   03/12/24 1000  doxycycline  (VIBRA -TABS) tablet 100 mg        100 mg Oral Every 12 hours 03/12/24 0953 03/18/24 2359   03/12/24 0815  levofloxacin  (LEVAQUIN ) IVPB 750 mg  Status:  Discontinued        750 mg 100 mL/hr over 90 Minutes Intravenous Every 24 hours 03/12/24 0807 03/12/24 0812   03/12/24 0815  levofloxacin  (LEVAQUIN ) IVPB 750 mg  Status:  Discontinued        750 mg 100 mL/hr over 90 Minutes Intravenous Every 24 hours 03/12/24 0812 03/12/24 0822   03/12/24 0630  vancomycin  (VANCOCIN ) IVPB 1000 mg/200 mL premix        1,000 mg 200 mL/hr over 60 Minutes Intravenous  Once 03/12/24 0620 03/12/24 0757   03/12/24 0445  azithromycin  (ZITHROMAX ) 500 mg in sodium chloride  0.9 % 250 mL IVPB        500 mg 250 mL/hr over 60 Minutes Intravenous  Once 03/12/24 0442 03/12/24 0640         Component Value Date/Time   SDES EXPECTORATED SPUTUM 03/14/2024 0812   SDES EXPECTORATED SPUTUM 03/14/2024 0812   SPECREQUEST NONE 03/14/2024 0812   SPECREQUEST NONE Reflexed from [id number] 03/14/2024 0812   CULT  03/14/2024 0812    RARE PSEUDOMONAS AERUGINOSA RARE KLEBSIELLA AEROGENES    REPTSTATUS 03/15/2024 FINAL 03/14/2024 0812   REPTSTATUS 03/18/2024 FINAL 03/14/2024 9187   Procedures:  Mennie LAMY, MD [facility] 03/23/2024, 10:51 AM   "

## 2024-03-24 ENCOUNTER — Other Ambulatory Visit (HOSPITAL_COMMUNITY): Payer: Self-pay

## 2024-03-24 DIAGNOSIS — J150 Pneumonia due to Klebsiella pneumoniae: Secondary | ICD-10-CM | POA: Diagnosis not present

## 2024-03-24 LAB — GLUCOSE, CAPILLARY
Glucose-Capillary: 101 mg/dL — ABNORMAL HIGH (ref 70–99)
Glucose-Capillary: 149 mg/dL — ABNORMAL HIGH (ref 70–99)

## 2024-03-24 MED ORDER — PREDNISONE 10 MG PO TABS
ORAL_TABLET | ORAL | 0 refills | Status: DC
  Filled 2024-03-24: qty 6, 4d supply, fill #0

## 2024-03-24 MED ORDER — NYSTATIN 100000 UNIT/ML MT SUSP: 5.0000 mL | Freq: Four times a day (QID) | OROMUCOSAL | Status: DC

## 2024-03-24 MED ORDER — ACIDOPHILUS PO CAPS
1.0000 | ORAL_CAPSULE | Freq: Two times a day (BID) | ORAL | 0 refills | Status: AC
  Filled 2024-03-24: qty 60, 30d supply, fill #0

## 2024-03-24 MED ORDER — PREDNISONE 10 MG PO TABS: ORAL_TABLET | ORAL | Status: DC

## 2024-03-24 MED ORDER — NYSTATIN 100000 UNIT/ML MT SUSP
5.0000 mL | Freq: Four times a day (QID) | OROMUCOSAL | 0 refills | Status: AC
  Filled 2024-03-24: qty 60, 3d supply, fill #0

## 2024-03-24 MED ORDER — ENSURE PLUS HIGH PROTEIN PO LIQD: 237.0000 mL | Freq: Two times a day (BID) | ORAL | Status: DC

## 2024-03-24 MED ORDER — LIDOCAINE 5 % EX PTCH
1.0000 | MEDICATED_PATCH | CUTANEOUS | 0 refills | Status: AC
  Filled 2024-03-24: qty 7, 7d supply, fill #0

## 2024-03-24 MED ORDER — ENSURE PLUS HIGH PROTEIN PO LIQD
237.0000 mL | Freq: Two times a day (BID) | ORAL | 0 refills | Status: DC
  Filled 2024-03-24: qty 3318, 7d supply, fill #0

## 2024-03-24 MED ORDER — RISAQUAD PO CAPS: 1.0000 | ORAL_CAPSULE | Freq: Two times a day (BID) | ORAL | Status: DC

## 2024-03-24 MED ORDER — LIDOCAINE 5 % EX PTCH: 1.0000 | MEDICATED_PATCH | CUTANEOUS | Status: DC

## 2024-03-24 NOTE — Discharge Summary (Addendum)
 Physician Discharge Summary  Steven Guzman FMW:[id number] DOB: 1941-01-16 DOA: 03/12/2024  PCP: Esmeralda Morton SAUNDERS, PA-C  Admit date: 03/12/2024 Discharge date: 03/24/2024 Recommendations for Outpatient Follow-up:  Follow up with PCP in 1 weeks-call for appointment Please obtain BMP/CBC in one week Antihypertensive including Aldactone  remains on hold due to soft blood pressure Follow with pulmonary/follow-up CT scan for the lung nodule in 1 month  Discharge Dispo: Home w/ hh Discharge Condition: Stable Code Status:   Code Status: Full Code Diet recommendation:  Diet Order             Diet regular Room service appropriate? Yes; Fluid consistency: Thin  Diet effective now                    Brief/Interim Summary: Steven Guzman is a 83 y.o. male with PMH of  HTN, COPD, chronic hypoxic respiratory failure, CAD, HFrEF, and VT with ICD presenting with pleuritic chest pain and shortness of breath. Patient reports a recent hospitalization, posthospitalization remain really weak, developed left-sided chest pain with shortness of breath overnight.  CTA is negative for PE,7.3 x 6.6 cm rounded ill-defined masslike consolidative opacity - pneumonia (vs neoplasm). 4.5 cm diameter ascending thoracic aortic aneurysm. Being iv steroid antibiotics and slowly improving. He felt like he can go home safely with home health as his wife is already in a rehab and wanted to cont on rehab. At this time waiting on placement to rehab. Even though a SNF was approved patient at this time refusing to go to SNF and wants to go home today as his wife will be discharged from his SNF to home and he would like to be there to take care of her Of note patient is at high risk for readmission  Subjective: Seen and examined this am He is resting well, no new complaints Dyspneic and hypoxic on ambulation. Overnight remains afebrile on [place] at 3l   Discharge Diagnoses:   Possible bacterial multifocal Pneumonia,  POA 7.3 x 6.6 cm rounded ill-defined masslike consolidative opacity: Completed antibiotic course. sputum cultures-- appears to be colonized as culture has rare Pseudomonas and Klebsiella Remains clinically stable on supplemental oxygen  from previous admission on 3 L.   Continue PT OT, IS, Pulm toileting. Steroid being tapered off SLP eval completed advised not to use a straw. CXR 12/20 showed multifocal infiltrates. ON ADMIT-ct showed 7.3 x 6.6 cm rounded ill-defined masslike consolidative opacity in the posterior left upper lobe. Imaging features are compatible with pneumonia although neoplasm is not excluded and close follow-up-this was discussed with the patient and he will need follow-up imaging soon ON D/C within a month.  Chronic hypoxic respiratory failure : continue with 3 L supplemental oxygen  with goal  88-92%.Continue pulmonary toileting I-S   Hyponatremia: Mild, stable    Constipation: Improved, continue stool softener.    Acute on chronic combined systolic and diastolic CHF TTE 87/3/74 showed EF of 35-40%. Continue with lasix  40 mg PO Daily  Net IO Since Admission: -81,121.19 mL [03/24/24 1045]   AF, permanent s/p d fib ICD: Stable, rate controlled- continue Coreg , mexiletine, amiodarone , and Eliquis .  Monitor on telemetry  CAD Atypical chest pain-resolved  Essential hypertension HLD: hypotensive, holding home heart medications stable bp on low-dose Coreg  3.125, oral Lasix  Denies any chest pain.  Continue her aspirin  81.On Repatha  q 2 wkly   History of abdominal aortic aneurysm: Noted-stable  Chronic idiopathic thrombocytopenia:  Last platelet count normal in 250s    Bilateral sensorineural  hearing loss: Stable, able to communicate   Degenerative cervical spinal stenosis: Stable.  Denies any numbness or tingling, acute on chronic generalized weaknesses continue with PT   B12 deficiency: No acute issues   Anemia of chronic disease with iron deficiency  anemia: Stable, continue ferrous sulfate , H&H stable overall.  Follow-up outpatient   BPH: Monitoring for urinary retention, currently no complaint, continue Flomax    COPD GOLD grade C: Stable, inhalational bronchodilator therapy. Encouraging incentive spirometer, flutter valve, mucolytics.    Major depressive disorder with single episode, in partial remission Mood stable, continue home medication of Lexapro    GERD: Continue PPI   Prediabetes Last A1c 5 months ago 5.8.  Blood sugar fairly controlled keep on SSI   Mobility: PT Orders: Active PT Follow up Rec: Skilled Nursing-Short Term Rehab (<3 Hours/Day)03/22/2024 1618   DVT prophylaxis: SCDs Start: 03/12/24 0808 Code Status:   Code Status: Full Code Family Communication: plan of care discussed with patient at bedside. Patient status is: Remains hospitalized because of severity of illness Level of care: Telemetry   Dispo: The patient is from: home            Anticipated disposition: Awaiting for SNF, his wife is at the SNF, unable to return home .awaiting to complete  P2P -peer to peer requested: Provider to call: [phone] Option 5. Deadline is: 03/24/24 11:00 AM _ PEER TO PEER Completed 12/26 and approved for SNF.  Objective: Vitals last 24 hrs: Vitals:   03/24/24 0448 03/24/24 0500 03/24/24 0836 03/24/24 0922  BP: 118/78   98/61  Pulse: 65  66   Resp:   19   Temp: 97.6 F (36.4 C)     TempSrc: Oral     SpO2: 98%  97%   Weight:  65.5 kg    Height:        Physical Examination: General exam: AAOX3.  Pleasant HEENT:Oral mucosa moist, Ear/Nose WNL grossly Respiratory system: CTA Bilaterally Cardiovascular system: S1 & S2 +, No JVD. Gastrointestinal system: Abdomen soft,NT,ND, BS+ Nervous System: Alert, awake, non focal Extremities: extremities warm, leg edema neg Skin: Warm, no rashes MSK: Normal muscle bulk,tone, power       Consultation: See note.  Discharge Instructions  Discharge Instructions      (HEART FAILURE PATIENTS) Call MD:  Anytime you have any of the following symptoms: 1) 3 pound weight gain in 24 hours or 5 pounds in 1 week 2) shortness of breath, with or without a dry hacking cough 3) swelling in the hands, feet or stomach 4) if you have to sleep on extra pillows at night in order to breathe.   Complete by: As directed    Discharge instructions   Complete by: As directed    Please call call MD or return to ER for similar or worsening recurring problem that brought you to hospital or if any fever,nausea/vomiting,abdominal pain, uncontrolled pain, chest pain,  shortness of breath or any other alarming symptoms.  Please follow-up your doctor as instructed in a week time and call the office for appointment.  Please avoid alcohol , smoking, or any other illicit substance and maintain healthy habits including taking your regular medications as prescribed.  You were cared for by a hospitalist during your hospital stay. If you have any questions about your discharge medications or the care you received while you were in the hospital after you are discharged, you can call the unit and ask to speak with the hospitalist on call if the hospitalist that took  care of you is not available.  Once you are discharged, your primary care physician will handle any further medical issues. Please note that NO REFILLS for any discharge medications will be authorized once you are discharged, as it is imperative that you return to your primary care physician (or establish a relationship with a primary care physician if you do not have one) for your aftercare needs so that they can reassess your need for medications and monitor your lab values   Increase activity slowly   Complete by: As directed    No wound care   Complete by: As directed       Allergies as of 03/24/2024       Reactions   Sulfa Antibiotics Hives   Cephalexin  Itching        Medication List     STOP taking these  medications    spironolactone  25 MG tablet Commonly known as: ALDACTONE        TAKE these medications    acetaminophen  500 MG tablet Commonly known as: TYLENOL  Take 1,000 mg by mouth every 6 (six) hours as needed for moderate pain.   acidophilus Caps capsule Take 1 capsule by mouth 2 (two) times daily.   albuterol  (2.5 MG/3ML) 0.083% nebulizer solution Commonly known as: PROVENTIL  USE 1 VIAL VIA NEBULIZER EVERY 6 HOURS AS NEEDED FOR WHEEZING OR SHORTNESS OF BREATH   amiodarone  200 MG tablet Commonly known as: PACERONE  Take 1 tablet (200 mg total) by mouth daily. \   aspirin  EC 81 MG tablet Take 1 tablet (81 mg total) by mouth daily. Swallow whole.   B12 LIQUID HEALTH BOOSTER PO Take 5,000 mcg by mouth daily.   Biotin  5000 MCG Caps Take 5,000 mcg by mouth daily.   CALCIUM  + D PO Take 1 tablet by mouth daily.   carvedilol  3.125 MG tablet Commonly known as: COREG  Take 1 tablet (3.125 mg total) by mouth 2 (two) times daily.   CENTRUM ADULTS PO Take 1 tablet by mouth daily.   cetirizine 10 MG tablet Commonly known as: ZYRTEC Take 10 mg by mouth daily.   Cholecalciferol  50 MCG (2000 UT) Tabs Take 2,000 Units by mouth daily at 6 (six) AM.   Combivent  Respimat 20-100 MCG/ACT Aers respimat Generic drug: Ipratropium-Albuterol  Inhale 1 puff into the lungs every 6 (six) hours. Shortness of breath or wheezing What changed:  when to take this reasons to take this   DSS 100 MG Caps Take 2 capsules by mouth daily in the afternoon.   Eliquis  5 MG Tabs tablet Generic drug: apixaban  TAKE 1 TABLET BY MOUTH TWICE A DAY   empagliflozin  10 MG Tabs tablet Commonly known as: JARDIANCE  Take 1 tablet (10 mg total) by mouth daily.   escitalopram  20 MG tablet Commonly known as: LEXAPRO  Take 20 mg by mouth daily.   feeding supplement Liqd Take 237 mLs by mouth 2 (two) times daily between meals for 7 days.   fluticasone  50 MCG/ACT nasal spray Commonly known as:  FLONASE  Place 2 sprays into both nostrils daily. What changed:  when to take this reasons to take this   Flutter Devi Use as directed   furosemide  40 MG tablet Commonly known as: LASIX  Take 1 tablet (40 mg total) by mouth daily.   Guaifenesin  1200 MG Tb12 Take 1,200 mg by mouth daily.   IRON PO Take 1 tablet by mouth daily.   lidocaine  5 % Commonly known as: LIDODERM  Place 1 patch onto the skin daily. Remove & Discard  patch within 12 hours or as directed by MD Start taking on: March 25, 2024   Magnesium  200 MG Tabs Take 400 mg by mouth daily.   melatonin 3 MG Tabs tablet Take 3 mg by mouth at bedtime.   mexiletine 200 MG capsule Commonly known as: MEXITIL  TAKE 1 CAPSULE BY MOUTH 2 TIMES A DAY   mupirocin  ointment 2 % Commonly known as: BACTROBAN  Apply 1 Application topically daily as needed (wound care).   nitroGLYCERIN  0.4 MG SL tablet Commonly known as: NITROSTAT  Place 1 tablet (0.4 mg total) under the tongue every 5 (five) minutes as needed for chest pain. DISSOLVE 1 TABLET UNDER THE TONGUE EVERY 5 MINUTES FOR 3 DOSES   nystatin  100000 UNIT/ML suspension Commonly known as: MYCOSTATIN  Take 5 mLs (500,000 Units total) by mouth 4 (four) times daily for 3 days.   omeprazole 20 MG capsule Commonly known as: PRILOSEC Take 20 mg by mouth daily.   optichamber diamond  Misc optichamber VHC   Potassium Chloride  ER 20 MEQ Tbcr Take 1 tablet (20 mEq total) by mouth daily.   predniSONE  10 MG tablet Commonly known as: DELTASONE  Take 2 tablets (20 mg total) by mouth daily with breakfast for 2 days, THEN 1 tablet (10 mg total) daily with breakfast for 2 days. Start taking on: March 25, 2024   pregabalin  25 MG capsule Commonly known as: LYRICA  Take by mouth in the morning and at bedtime.   Repatha  SureClick 140 MG/ML Soaj Generic drug: Evolocumab  INJECT 1 ML INTO THE SKIN EVERY 14 DAYS   SOOTHE XP OP Place 2 drops into both eyes daily as needed (dry  eyes).   Stiolto Respimat 2.5-2.5 MCG/ACT Aers Generic drug: Tiotropium Bromide -Olodaterol Inhale 2 puffs into the lungs daily.   tamsulosin  0.4 MG Caps capsule Commonly known as: FLOMAX  TAKE ONE CAPSULE BY MOUTH DAILY AFTER SUPPER   vitamin C 1000 MG tablet Take 1,000 mg by mouth daily.        Contact information for follow-up providers     Esmeralda Saltness R, PA-C Follow up in 1 week(s).   Specialty: General Practice Contact information: [address] [place] [address]              Contact information for after-discharge care     Destination     [facility]   Service: Skilled Nursing Contact information: [address] [place]  [address] [phone]                    Allergies[1]  The results of significant diagnostics from this hospitalization (including imaging, microbiology, ancillary and laboratory) are listed below for reference.    Microbiology: No results found for this or any previous visit (from the past 240 hours).  Procedures/Studies: DG CHEST PORT 1 VIEW Result Date: 03/18/2024 CLINICAL DATA:  Dyspnea. EXAM: PORTABLE CHEST 1 VIEW COMPARISON:  03/12/2024 radiograph and CT FINDINGS: Left lung opacity is becoming more confluent and extending peripherally to the pleural surface. Increasing patchy opacity at the right lung base. Stable heart size and mediastinal contours. Hiatal hernia. Left-sided pacemaker in place. Right-sided battery pack with lead coursing into the neck. No pulmonary edema. No pneumothorax or significant pleural effusion. IMPRESSION: Left lung opacity is becoming more confluent and extending peripherally to the pleural surface. Increasing patchy opacity at the right lung base. Findings are suspicious for multifocal pneumonia. Radiographic follow-up to resolution recommended. Electronically Signed   By: Andrea Marlee HERO.D.  On: 03/18/2024 15:30   DG Swallowing  Func-Speech Pathology Result Date: 03/15/2024 Table formatting from the original result was not included. Modified Barium Swallow Study Patient Details Name: JATORIAN RENAULT MRN: [id number] Date of Birth: May 07, 1940 Today's Date: 03/15/2024 HPI/PMH: HPI: 83 yo male presenting to ED 12/14 with pleuritic chest pain and SOB. Admitted with COPD exacerbation and pneumonia. CTA shows rounded ill-defined mass-like consolidative opacity, compatible with pneumonia with additional ill-defined nodular opacities identified in the RLL with evidence of peripheral small airway impaction and a moderate hiatal hernia. Seen by SLP 03/04/24 with functional appearing swallowing and for dysphonia 08/03/22. Stroboscopy 08/19/22 shows mild diffuse erythema, minimal posterior laryngeal edema, preserved vocal fold mobility with midfold atrophy, resulting in incomplete glottic closure. ENT confirms the presence of tonsilloliths, which are bothersome to the pt but that he does not wish to remove. PMH: COPD, CHF, CAD, GERD, MI, aortic aneurysm, ACDF, chronic neck/back pain, anemia, depression, idiopathic thrombocytopenia Clinical Impression: Pt exhibits moderate pharyngeal dysphagia characterized by mistiming, which may be a result of effort required to coordinate breathing and swallowing. Aspiration occurs when taking large, successive straw sips of thin liquids and while it is sensed, his cough cannot expel the bolus completely (PAS 7). This appears to happen as the bolus reaches the pyriform sinuses before the swallow is initiated and spills over into the larynx before he achieves complete closure. His second line of defense is ineffective secondary to known vocal fold atrophy with incomplete glottic closure. No aspiration occurs with cup sips of thin liquids and trace penetrates are ejected with a cued cough (PAS 4). While aspiration was also prevented with use of thickened liquids, suspect increased WOB has the potential to affect timing and  coordination throughout the course of a meal and if aspirated, thickened liquids are thought to cause more harm to his pulmonary health. Discussed factors that are within pt's control to minimize the risk of adverse events in the presence of aspiration, including oral care BID in addition to before PO intake and mobility. Pt wishes to continue regular diet with thin liquids via cup only and meds whole in puree. Will f/u for ongoing education. Recommend he continue dysphagia interventions with HH or OP SLP. Factors that may increase risk of adverse event in presence of aspiration Noe & Lianne 2021): Factors that may increase risk of adverse event in presence of aspiration Noe & Lianne 2021): Respiratory or GI disease; Inadequate oral hygiene Recommendations/Plan: Swallowing Evaluation Recommendations Swallowing Evaluation Recommendations Recommendations: PO diet PO Diet Recommendation: Regular; Thin liquids (Level 0) Liquid Administration via: Cup; No straw Medication Administration: Whole meds with puree Supervision: Patient able to self-feed; Intermittent supervision/cueing for swallowing strategies Swallowing strategies  : Slow rate; Small bites/sips; Hard cough after swallowing Postural changes: Position pt fully upright for meals; Stay upright 30-60 min after meals Oral care recommendations: Oral care BID (2x/day); Oral care before PO Treatment Plan Treatment Plan Treatment recommendations: Therapy as outlined in treatment plan below Follow-up recommendations: Home health SLP Functional status assessment: Patient has had a recent decline in their functional status and demonstrates the ability to make significant improvements in function in a reasonable and predictable amount of time. Treatment frequency: Min 2x/week Treatment duration: 2 weeks Interventions: Aspiration precaution training; Compensatory techniques; Patient/family education Recommendations Recommendations for follow up therapy are one  component of a multi-disciplinary discharge planning process, led by the attending physician.  Recommendations may be updated based on patient status, additional functional criteria and insurance authorization. Assessment: Orofacial  Exam: Orofacial Exam Oral Cavity: Oral Hygiene: WFL Oral Cavity - Dentition: Missing dentition; Dentures, top Orofacial Anatomy: WFL Oral Motor/Sensory Function: WFL Anatomy: Anatomy: Suspected cervical osteophytes; Presence of cervical hardware; Prominent cricopharyngeus Boluses Administered: Boluses Administered Boluses Administered: Thin liquids (Level 0); Mildly thick liquids (Level 2, nectar thick); Moderately thick liquids (Level 3, honey thick); Puree; Solid  Oral Impairment Domain: Oral Impairment Domain Lip Closure: No labial escape Tongue control during bolus hold: Posterior escape of less than half of bolus Bolus preparation/mastication: Timely and efficient chewing and mashing Bolus transport/lingual motion: Brisk tongue motion Oral residue: Complete oral clearance Location of oral residue : N/A Initiation of pharyngeal swallow : Pyriform sinuses  Pharyngeal Impairment Domain: Pharyngeal Impairment Domain Soft palate elevation: No bolus between soft palate (SP)/pharyngeal wall (PW) Laryngeal elevation: Complete superior movement of thyroid  cartilage with complete approximation of arytenoids to epiglottic petiole Anterior hyoid excursion: Complete anterior movement Epiglottic movement: Complete inversion Laryngeal vestibule closure: Complete, no air/contrast in laryngeal vestibule Pharyngeal stripping wave : Present - complete Pharyngeal contraction (A/P view only): N/A Pharyngoesophageal segment opening: Partial distention/partial duration, partial obstruction of flow Tongue base retraction: Trace column of contrast or air between tongue base and PPW Pharyngeal residue: Collection of residue within or on pharyngeal structures Location of pharyngeal residue: Tongue base;  Valleculae  Esophageal Impairment Domain: Esophageal Impairment Domain Esophageal clearance upright position: Esophageal retention Pill: Pill Consistency administered: Thin liquids (Level 0) Thin liquids (Level 0): Impaired (see clinical impressions) Penetration/Aspiration Scale Score: Penetration/Aspiration Scale Score 1.  Material does not enter airway: Moderately thick liquids (Level 3, honey thick); Puree; Solid; Pill 3.  Material enters airway, remains ABOVE vocal cords and not ejected out: Mildly thick liquids (Level 2, nectar thick) 7.  Material enters airway, passes BELOW cords and not ejected out despite cough attempt by patient: Thin liquids (Level 0) Compensatory Strategies: Compensatory Strategies Compensatory strategies: No   General Information: Caregiver present: No  Diet Prior to this Study: Regular; Thin liquids (Level 0)   Temperature : Normal   Respiratory Status: Increased WOB   Supplemental O2: Nasal cannula   History of Recent Intubation: No  Behavior/Cognition: Alert; Cooperative; Pleasant mood Self-Feeding Abilities: Able to self-feed Baseline vocal quality/speech: Dysphonic Volitional Cough: Able to elicit Volitional Swallow: Able to elicit Exam Limitations: No limitations Goal Planning: Prognosis for improved oropharyngeal function: Good Barriers to Reach Goals: Time post onset No data recorded Patient/Family Stated Goal: none stated Consulted and agree with results and recommendations: Patient; Nurse; Physician Pain: Pain Assessment Pain Assessment: No/denies pain Pain Intervention(s): Monitored during session End of Session: Start Time:SLP Start Time (ACUTE ONLY): 1252 Stop Time: SLP Stop Time (ACUTE ONLY): 1330 Time Calculation:SLP Time Calculation (min) (ACUTE ONLY): 38 min Charges: SLP Evaluations $ SLP Speech Visit: 1 Visit SLP Evaluations $BSS Swallow: 1 Procedure $MBS Swallow: 1 Procedure SLP visit diagnosis: SLP Visit Diagnosis: Dysphagia, pharyngeal phase (R13.13) Past Medical  History: Past Medical History: Diagnosis Date  AICD (automatic cardioverter/defibrillator) present   [employer] MOMENTUM EL ICD D121/ [phone]  Aneurysm   a. Aneurysmal infrarenal aorta up to 33 mm on CT 10/2014, recommended f/u due 10/2017  Anginal pain   Anxiety   Arthritis   right foot  Basal cell carcinoma of nose   S/P MOHS  Biliary acute pancreatitis   CAD (coronary artery disease)   a. s/p MI in 1994 with PCI to LAD at that time b. cath 10/2012 demonstrated EF 30%, inferior akinesis with mild hypokinesis of all walls,  patent LAD and RCA stents; ostial PDA with 80-90% obstruction with medical therapy recommended   Chronic systolic CHF (congestive heart failure) (HCC)   EF 30 to 35 % as of 09/2014.   CKD (chronic kidney disease), stage III (HCC)   Complication of anesthesia 10/2014  had to have defibrillator w/ERCP- CODED after having gallstones removed  COPD (chronic obstructive pulmonary disease) (HCC)   a. followed by pulmonary, COPD GOLD stage II  Depression   Diverticulosis of colon 07/2014  noted on CT  Dyspnea   with exertion  GERD (gastroesophageal reflux disease)   Hiatal hernia   large  History of kidney stones   passed stone  Hyperglycemia 10/2012  Hyperlipidemia   Hypertension   Myocardial infarction [facility]) 1994; 2011  Pneumonia 1946; 2015  Prostate enlargement 07/2014  observed on CT  Tobacco abuse   Ventricular tachycardia (HCC)   a. 08/2009 s/p BSX E110 Teligen 100 AICD, ser#: [id number];  b. 08/2008 VT req ATP - detection reprogrammed from 160 to 150. c. EPS and VT ablation by Dr. Waddell 12/21/2014 Past Surgical History: Past Surgical History: Procedure Laterality Date  BIOPSY  12/21/2017  Procedure: BIOPSY;  Surgeon: Abran Norleen SAILOR, MD;  Location: [facility] ENDOSCOPY;  Service: Endoscopy;;  CATARACT EXTRACTION W/ INTRAOCULAR LENS  IMPLANT, BILATERAL Bilateral ~ 2011  COLONOSCOPY    COLONOSCOPY WITH PROPOFOL  N/A 12/21/2017  Procedure: COLONOSCOPY WITH PROPOFOL ;  Surgeon: Abran Norleen SAILOR, MD;  Location: [facility]  ENDOSCOPY;  Service: Endoscopy;  Laterality: N/A;  ELECTROPHYSIOLOGIC STUDY N/A 12/21/2014  Procedure: V Tach Ablation;  Surgeon: Danelle LELON Waddell, MD;  Location: [facility] INVASIVE CV LAB;  Service: Cardiovascular;  Laterality: N/A;  ERCP N/A 11/16/2014  Procedure: ENDOSCOPIC RETROGRADE CHOLANGIOPANCREATOGRAPHY (ERCP);  Surgeon: Lamar JONETTA Aho, MD;  Location: [facility] ENDOSCOPY;  Service: Endoscopy;  Laterality: N/A;  ESOPHAGOGASTRODUODENOSCOPY (EGD) WITH PROPOFOL  N/A 12/21/2017  Procedure: ESOPHAGOGASTRODUODENOSCOPY (EGD) WITH PROPOFOL ;  Surgeon: Abran Norleen SAILOR, MD;  Location: [facility] ENDOSCOPY;  Service: Endoscopy;  Laterality: N/A;  EYE SURGERY    FOOT SURGERY Left 2005  fixed bone that stuck out in my ankle area  HEMORRHOID BANDING    ICD GENERATOR CHANGEOUT N/A 04/20/2022  Procedure: ICD GENERATOR CHANGEOUT;  Surgeon: Waddell Danelle LELON, MD;  [facility] [facility] INVASIVE CV LAB;  Service: Cardiovascular;  Laterality: N/A;  IMPLANTABLE CARDIOVERTER DEFIBRILLATOR IMPLANT  09/06/09  BSX dual chamber ICD implanted in [place]  for cardiac arrest 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  INGUINAL HERNIA REPAIR Right ~ 1995  INGUINAL HERNIA REPAIR Left 04/10/2022  Procedure: HERNIA REPAIR INGUINAL ADULT;  Surgeon: Ebbie Cough, MD;  Location: [facility] OR;  Service: General;  Laterality: Left;  LEFT HEART CATH AND CORONARY ANGIOGRAPHY N/A 03/19/2022  Procedure: LEFT HEART CATH AND CORONARY ANGIOGRAPHY;  Surgeon: Jordan, Peter M, MD;  Location: [facility] INVASIVE CV LAB;  Service: Cardiovascular;  Laterality: N/A;  LEFT HEART CATHETERIZATION WITH CORONARY ANGIOGRAM N/A 11/25/2012  demonstrated EF 30%, inferior akinesis with mild hypokinesis of all walls, patent LAD and RCA stents; ostial PDA with 80-90% obstruction with medical therapy recommended  MOHS SURGERY  2008  nose, skin graft  POLYPECTOMY  12/21/2017  Procedure: POLYPECTOMY;  Surgeon: Abran Norleen SAILOR, MD;  Location: [facility];  Service: Endoscopy;;  RETINAL DETACHMENT SURGERY Right 2013  TENOLYSIS Right 12/21/2013   Procedure: TENOLYSIS FLEXOR CARPI RADIALIS ,DEBRIDEMENT RIGHT JOINT WRIST,DEBRIDEMENT SCAPHOTRAPEZIAL TRAPEZOID, REPAIR OF EXTENSOR HOOD;  Surgeon: Arley Curia, MD;  Location: [facility];  Service: Orthopedics;  Laterality: Right;  TOE SURGERY Right 09/2019  3rd toe/hammer toe  V-TACH ABLATION  12/21/2014  VIDEO BRONCHOSCOPY Bilateral 01/09/2016  Procedure: VIDEO BRONCHOSCOPY WITHOUT FLUORO;  Surgeon: Vicenta KATHEE Lennert, MD;  Location: [facility];  Service: Cardiopulmonary;  Laterality: Bilateral; [name], M.A., CCC-SLP Speech Language Pathology, Acute Rehabilitation Services Secure Chat preferred [phone] 03/15/2024, 2:12 PM  CT Angio Chest PE W and/or Wo Contrast Result Date: 03/12/2024 CLINICAL DATA:  Shortness of breath and chest pain. Syncope. Clinical concern for pulmonary embolus. EXAM: CT ANGIOGRAPHY CHEST WITH CONTRAST TECHNIQUE: Multidetector CT imaging of the chest was performed using the standard protocol during bolus administration of intravenous contrast. Multiplanar CT image reconstructions and MIPs were obtained to evaluate the vascular anatomy. RADIATION DOSE REDUCTION: This exam was performed according to the departmental dose-optimization program which includes automated exposure control, adjustment of the mA and/or kV according to patient size and/or use of iterative reconstruction technique. CONTRAST:  75mL OMNIPAQUE  IOHEXOL  350 MG/ML SOLN COMPARISON:  02/25/2022 FINDINGS: Cardiovascular: Heart size upper normal. No substantial pericardial effusion. Coronary artery calcification is evident. Moderate atherosclerotic calcification is noted in the wall of the thoracic aorta. Ascending thoracic aorta measures 4.5 cm diameter. There is no filling defect within the opacified pulmonary arteries to suggest the presence of an acute pulmonary embolus. Left-sided permanent pacemaker/AICD evident. Battery pack for stimulator device overlies the upper right anterior hemithorax.  Mediastinum/Nodes: No mediastinal lymphadenopathy. Upper normal 10 mm short axis lymph nodes are identified in both hilar regions. The esophagus has normal imaging features. Moderate hiatal hernia. There is no axillary lymphadenopathy. Lungs/Pleura: Centrilobular and paraseptal emphysema evident. 7.3 x 6.6 cm rounded ill-defined masslike consolidative opacity identified posterior left upper lobe. Clustered ill-defined nodularity identified in the right lower lobe with dominant nodular component measuring up to 2.2 cm diameter on image 77/6. Discrete right lower lobe nodule measuring 13 mm identified on 100/6. Peripheral tree-in-bud nodularity identified in the posterior right costophrenic sulcus with associated peripheral small airway impaction. No substantial pleural effusion.  No pulmonary edema. Upper Abdomen: Pneumobilia suggests prior sphincterotomy, similar to prior. Nodular low-density thickening of the adrenal glands is compatible hyperplasia, potentially with superimposed benign left adrenal adenomas bilaterally. Musculoskeletal: No worrisome lytic or sclerotic osseous abnormality. Nonacute posterior right twelfth rib fracture evident with healed posterior right eighth rib fracture. Review of the MIP images confirms the above findings. IMPRESSION: 1. No CT evidence for acute pulmonary embolus. 2. 7.3 x 6.6 cm rounded ill-defined masslike consolidative opacity in the posterior left upper lobe. Imaging features are compatible with pneumonia although neoplasm is not excluded and close follow-up warranted. 3. Additional ill-defined nodular opacities identified in the right lower lobe with evidence of peripheral small airway impaction. Also likely infectious/inflammatory, close follow-up warranted. Aspiration not excluded. 4. Upper normal lymph nodes in both hilar regions, likely reactive. 5. 4.5 cm diameter ascending thoracic aortic aneurysm. Recommend semi-annual imaging followup by CTA or MRA and referral to  cardiothoracic surgery if not already obtained. This recommendation follows 2010 ACCF/AHA/AATS/ACR/ASA/SCA/SCAI/SIR/STS/SVM Guidelines for the Diagnosis and Management of Patients With Thoracic Aortic Disease. Circulation. 2010; 121: Z733-z630. Aortic aneurysm NOS (ICD10-I71.9) 6. Moderate hiatal hernia. 7. Aortic Atherosclerosis (ICD10-I70.0) and Emphysema (ICD10-J43.9). Electronically Signed   By: Camellia Candle M.D.   On: 03/12/2024 06:00   DG Chest Portable 1 View Result Date: 03/12/2024 EXAM: 1 VIEW(S) XRAY OF THE CHEST 03/12/2024 01:33:00 AM COMPARISON: Recent prior study. CLINICAL HISTORY: sob and chest pain FINDINGS: LINES, TUBES AND DEVICES: Left subclavian pacemaker. Right neck stimulator. LUNGS AND PLEURA: Focal patchy/masslike left mid  lung opacity, new/progressive from recent prior, suggesting left perihilar pneumonia. No pleural effusion. No pneumothorax. HEART AND MEDIASTINUM: No acute abnormality of the cardiac and mediastinal silhouettes. BONES AND SOFT TISSUES: No acute osseous abnormality. IMPRESSION: 1. Focal patchy/masslike left mid lung opacity, new/progressive from recent prior, suggesting left perihilar pneumonia. Follow-up is suggested in 4 weeks to document resolution. Electronically signed by: Pinkie Pebbles MD 03/12/2024 01:36 AM EST RP Workstation: [id number]   ECHOCARDIOGRAM COMPLETE Result Date: 03/04/2024    ECHOCARDIOGRAM REPORT   Patient Name:   ANTWOINE ZORN Date of Exam: 03/04/2024 Medical Rec #:  [id number]        Height:       71.0 in Accession #:    [id number]       Weight:       146.0 lb Date of Birth:  03-16-1941        BSA:          1.845 m Patient Age:    83 years         BP:           76/64 mmHg Patient Gender: M                HR:           85 bpm. Exam Location:  Inpatient Procedure: 2D Echo, Cardiac Doppler and Color Doppler (Both Spectral and Color            Flow Doppler were utilized during procedure). Indications:    CHF- Acute Systolic  History:         Patient has prior history of Echocardiogram examinations, most                 recent 08/11/2023. CHF, CAD, COPD, Arrythmias:Atrial                 Fibrillation; Risk Factors:Hypertension and Dyslipidemia.  Sonographer:    Sherlean Dubin Referring Phys: [id number] SUBRINA SUNDIL  Sonographer Comments: No subcostal window. Image acquisition challenging due to patient body habitus and Image acquisition challenging due to respiratory motion. IMPRESSIONS  1. Left ventricular ejection fraction, by estimation, is 35 to 40%. The left ventricle has moderately decreased function. Left ventricular endocardial border not optimally defined to evaluate regional wall motion. Left ventricular diastolic function could not be evaluated.  2. Right ventricular systolic function is mildly reduced. The right ventricular size is normal.  3. Right atrial size was mildly dilated.  4. The mitral valve is normal in structure. Trivial mitral valve regurgitation. No evidence of mitral stenosis.  5. The aortic valve is calcified. There is severe calcifcation of the aortic valve. There is severe thickening of the aortic valve. Aortic valve regurgitation is not visualized. Aortic valve sclerosis/calcification is present, without any evidence of aortic stenosis. Aortic valve Vmax measures 1.28 m/s.  6. Recommend limited study with definity  contrast to assess the LV apex for thrombus and assess focal wall motion. FINDINGS  Left Ventricle: Left ventricular ejection fraction, by estimation, is 35 to 40%. The left ventricle has moderately decreased function. Left ventricular endocardial border not optimally defined to evaluate regional wall motion. The left ventricular internal cavity size was normal in size. There is no left ventricular hypertrophy. Left ventricular diastolic function could not be evaluated. Right Ventricle: The right ventricular size is normal. No increase in right ventricular wall thickness. Right ventricular systolic function is  mildly reduced. Left Atrium: Left atrial size was normal in size. Right Atrium: Right atrial size was  mildly dilated. Pericardium: There is no evidence of pericardial effusion. Mitral Valve: The mitral valve is normal in structure. Trivial mitral valve regurgitation. No evidence of mitral valve stenosis. Tricuspid Valve: The tricuspid valve is normal in structure. Tricuspid valve regurgitation is not demonstrated. No evidence of tricuspid stenosis. Aortic Valve: The aortic valve is calcified. There is severe calcifcation of the aortic valve. There is severe thickening of the aortic valve. Aortic valve regurgitation is not visualized. Aortic valve sclerosis/calcification is present, without any evidence of aortic stenosis. Aortic valve peak gradient measures 6.6 mmHg. Pulmonic Valve: The pulmonic valve was normal in structure. Pulmonic valve regurgitation is not visualized. No evidence of pulmonic stenosis. Aorta: The aortic root is normal in size and structure. Venous: The inferior vena cava was not well visualized. IAS/Shunts: No atrial level shunt detected by color flow Doppler. Additional Comments: A device lead is visualized.  LEFT VENTRICLE PLAX 2D LVIDd:         5.50 cm   Diastology LVIDs:         4.50 cm   LV e' medial:  9.79 cm/s LV PW:         1.20 cm   LV e' lateral: 7.62 cm/s LV IVS:        1.00 cm LVOT diam:     2.50 cm LV SV:         93 LV SV Index:   51 LVOT Area:     4.91 cm  RIGHT VENTRICLE RV Basal diam:  3.10 cm     PULMONARY VEINS RV Mid diam:    2.80 cm     Diastolic Velocity: 15.70 cm/s RV S prime:     10.30 cm/s  S/D Velocity:       1.30 TAPSE (M-mode): 1.6 cm      Systolic Velocity:  20.00 cm/s LEFT ATRIUM             Index        RIGHT ATRIUM           Index LA Vol (A2C):   55.4 ml 30.03 ml/m  RA Area:     19.60 cm LA Vol (A4C):   47.7 ml 25.86 ml/m  RA Volume:   63.00 ml  34.15 ml/m LA Biplane Vol: 52.1 ml 28.24 ml/m  AORTIC VALVE AV Area (Vmax): 3.36 cm AV Vmax:        128.00 cm/s AV  Peak Grad:   6.6 mmHg LVOT Vmax:      87.50 cm/s LVOT Vmean:     61.800 cm/s LVOT VTI:       0.190 m  AORTA Ao Root diam: 3.40 cm Ao Asc diam:  3.10 cm  SHUNTS Systemic VTI:  0.19 m Systemic Diam: 2.50 cm Wilbert Bihari MD Electronically signed by Wilbert Bihari MD Signature Date/Time: 03/04/2024/5:17:00 PM    Final    DG Chest 2 View Result Date: 03/03/2024 CLINICAL DATA:  ShOB EXAM: CHEST - 2 VIEW COMPARISON:  11/12/2023 FINDINGS: Redemonstrated pulse stimulator device in the right chest with a single lead coursing into the right neck. No focal airspace consolidation, pleural effusion, or pneumothorax. Mild cardiomegaly. Left chest pacemaker/AICD with leads terminating in the right atrium and right ventricle. Tortuous aorta with aortic atherosclerosis. Osteopenia. Multilevel thoracic osteophytosis. IMPRESSION: No acute cardiopulmonary abnormality. Electronically Signed   By: Rogelia Myers M.D.   On: 03/03/2024 15:27    Labs: BNP (last 3 results) Recent Labs    03/03/24 1414 03/12/24 0110  BNP 267.5* 197.6*   Basic Metabolic Panel: Recent Labs  Lab 03/18/24 0303 03/23/24 0234  NA 134* 132*  K 3.5 4.2  CL 95* 92*  CO2 33* 35*  GLUCOSE 139* 108*  BUN 12 15  CREATININE 0.49* 0.52*  CALCIUM  8.2* 8.3*   Liver Function Tests: No results for input(s): AST, ALT, ALKPHOS, BILITOT, PROT, ALBUMIN in the last 168 hours. No results for input(s): LIPASE, AMYLASE in the last 168 hours. No results for input(s): AMMONIA in the last 168 hours. CBC: Recent Labs  Lab 03/18/24 0303 03/23/24 0234  WBC 7.2 7.3  HGB 10.6* 10.7*  HCT 34.7* 34.6*  MCV 95.3 95.8  PLT 257 239   CBG: Recent Labs  Lab 03/23/24 0807 03/23/24 1200 03/23/24 1725 03/23/24 2029 03/24/24 0800  GLUCAP 101* 145* 98 138* 101*   Hgb A1c No results for input(s): HGBA1C in the last 72 hours. Anemia work up No results for input(s): VITAMINB12, FOLATE, FERRITIN, TIBC, IRON, RETICCTPCT in the  last 72 hours. Cardiac Enzymes: No results for input(s): CKTOTAL, CKMB, CKMBINDEX, TROPONINI in the last 168 hours. BNP: Invalid input(s): POCBNP D-Dimer No results for input(s): DDIMER in the last 72 hours. Lipid Profile No results for input(s): CHOL, HDL, LDLCALC, TRIG, CHOLHDL, LDLDIRECT in the last 72 hours. Thyroid  function studies No results for input(s): TSH, T4TOTAL, T3FREE, THYROIDAB in the last 72 hours.  Invalid input(s): FREET3 Urinalysis    Component Value Date/Time   COLORURINE YELLOW 03/03/2024 1415   APPEARANCEUR CLEAR 03/03/2024 1415   LABSPEC 1.015 03/03/2024 1415   PHURINE 7.0 03/03/2024 1415   GLUCOSEU >=500 (A) 03/03/2024 1415   GLUCOSEU NEGATIVE 05/24/2015 1007   HGBUR NEGATIVE 03/03/2024 1415   BILIRUBINUR NEGATIVE 03/03/2024 1415   BILIRUBINUR Negative 12/11/2021 1529   KETONESUR NEGATIVE 03/03/2024 1415   PROTEINUR NEGATIVE 03/03/2024 1415   UROBILINOGEN 0.2 12/11/2021 1529   UROBILINOGEN 1.0 05/24/2015 1007   NITRITE NEGATIVE 03/03/2024 1415   LEUKOCYTESUR NEGATIVE 03/03/2024 1415   Sepsis Labs Recent Labs  Lab 03/18/24 0303 03/23/24 0234  WBC 7.2 7.3   Microbiology No results found for this or any previous visit (from the past 240 hours).   Time coordinating discharge: 35  minutes  SIGNED: Mennie LAMY, MD  [facility] 03/24/2024, 12:01 PM  If 7PM-7AM, please contact night-coverage [email or web address]       [1]  Allergies Allergen Reactions   Sulfa Antibiotics Hives   Cephalexin  Itching

## 2024-03-24 NOTE — TOC Progression Note (Signed)
 Transition of Care [facility]) - Progression Note    Patient Details  Name: Steven Guzman MRN: [id number] Date of Birth: 05/18/1940  Transition of Care [facility]) CM/SW Contact  Bekka Qian LITTIE [place], [place] Phone Number: 03/24/2024, 11:30 AM  Clinical Narrative:    CSW received message from MD, peer to peer has been completed and SNF auth was approved. CSW confirmed bed availability with [facility] [place], facility can admit today. CSW awaiting Auth ID and will continue to follow.   Expected Discharge Plan: Skilled Nursing Facility Barriers to Discharge: Continued Medical Work up, SNF Pending bed offer, English As A Second Language Teacher               Expected Discharge Plan and Services In-house Referral: NA Discharge Planning Services: CM Consult Post Acute Care Choice: Skilled Nursing Facility Living arrangements for the past 2 months: Single Family Home Expected Discharge Date: 03/24/24               DME Arranged: N/A DME Agency: NA       HH Arranged: RN, PT, OT HH Agency: [facility] Date HH Agency Contacted: 03/14/24 Time HH Agency Contacted: 1654 Representative spoke with at [facility] Agency: [place]   Social Drivers of Health (SDOH) Interventions SDOH Screenings   Food Insecurity: No Food Insecurity (03/13/2024)  Housing: Low Risk (03/13/2024)  Transportation Needs: No Transportation Needs (03/13/2024)  Utilities: Not At Risk (03/13/2024)  Alcohol  Screen: Low Risk (03/06/2021)  Depression (PHQ2-9): Low Risk (03/12/2022)  Financial Resource Strain: Medium Risk (03/06/2021)  Physical Activity: Insufficiently Active (03/06/2021)  Social Connections: Moderately Isolated (03/13/2024)  Stress: No Stress Concern Present (12/27/2021)   Received from [facility], [facility] visits prior to 05/30/2022.  Tobacco Use: High Risk (03/12/2024)    Readmission Risk Interventions    03/14/2024    4:52 PM  Readmission Risk Prevention Plan  Transportation Screening  Complete  Medication Review (RN Care Manager) Complete  HRI or Home Care Consult Complete  Palliative Care Screening Not Applicable  Skilled Nursing Facility Not Applicable

## 2024-03-24 NOTE — Progress Notes (Signed)
 Patient is anxious and stating that he is not going to a SNF and also stating he has to take care of his wife and wants to go home. Primary nurse and CM notified. Discharge delayed at this time.

## 2024-03-24 NOTE — Plan of Care (Signed)
  Problem: Health Behavior/Discharge Planning: Goal: Ability to manage health-related needs will improve Outcome: Progressing   Problem: Nutritional: Goal: Maintenance of adequate nutrition will improve Outcome: Progressing   Problem: Education: Goal: Knowledge of General Education information will improve Description: Including pain rating scale, medication(s)/side effects and non-pharmacologic comfort measures Outcome: Progressing   Problem: Activity: Goal: Risk for activity intolerance will decrease Outcome: Progressing

## 2024-03-24 NOTE — TOC Progression Note (Addendum)
 Transition of Care (TOC) - Progression Note   NCM and SW spoke to patient at bedside. Patient has decided to go home at discharge with home health through [facility] and not to SNF. His wife is at a SNF and is being discharged tomorrow.   MD aware and has ordered home health. Kelly with [facility] aware and will continue services.   Patient has oxygen  through [facility] and has a concentrator , however both his portable tanks are empty . He is also requested a rolator .   Patient requesting ambulance transport home. Ambulance unable to transport DME. Jermaine with [employer] aware and state [employer] can deliver oxygen  tanks and Rollator to address today. Patient wants [employer] to call him [phone] to arrange delivery. His sister in law lives close by and can met [employer] at patient's address.   Patient has a key to get in.   Patient will call his SIL when [facility] picks him up at hospital and she will met him at home.   Confirmed face sheet address . [facility] paperwork on chart and called . Nurse taking patient to discharge lounge . [facility] aware . Estimated pick up time 1 hour to 1.5 hours, nurse aware   Patient also has [employer]'s number.  Patient Details  Name: Steven Guzman MRN: [id number] Date of Birth: 01/22/1941  Transition of Care [facility] CM/SW Contact  Eliya Geiman, [name] Jansky, RN Phone Number: 03/24/2024, 12:39 PM  Clinical Narrative:       Expected Discharge Plan: Skilled Nursing Facility Barriers to Discharge: Continued Medical Work up, SNF Pending bed offer, Insurance Authorization               Expected Discharge Plan and Services In-house Referral: NA Discharge Planning Services: CM Consult Post Acute Care Choice: Skilled Nursing Facility Living arrangements for the past 2 months: Single Family Home Expected Discharge Date: 03/24/24               DME Arranged: N/A DME Agency: NA       HH Arranged: RN, PT, OT HH Agency: [facility] Date HH Agency Contacted:  03/14/24 Time HH Agency Contacted: 1654 Representative spoke with at [facility] Agency: [place]   Social Drivers of Health (SDOH) Interventions SDOH Screenings   Food Insecurity: No Food Insecurity (03/13/2024)  Housing: Low Risk (03/13/2024)  Transportation Needs: No Transportation Needs (03/13/2024)  Utilities: Not At Risk (03/13/2024)  Alcohol  Screen: Low Risk (03/06/2021)  Depression (PHQ2-9): Low Risk (03/12/2022)  Financial Resource Strain: Medium Risk (03/06/2021)  Physical Activity: Insufficiently Active (03/06/2021)  Social Connections: Moderately Isolated (03/13/2024)  Stress: No Stress Concern Present (12/27/2021)   Received from [facility], [facility] visits prior to 05/30/2022.  Tobacco Use: High Risk (03/12/2024)    Readmission Risk Interventions    03/14/2024    4:52 PM  Readmission Risk Prevention Plan  Transportation Screening Complete  Medication Review (RN Care Manager) Complete  HRI or Home Care Consult Complete  Palliative Care Screening Not Applicable  Skilled Nursing Facility Not Applicable

## 2024-03-26 NOTE — ED Provider Notes (Addendum)
 " Steven Guzman  ED Provider Note  Steven Guzman 83 y.o. male DOB: 1941-01-25 MRN: [id number] History   Chief Complaint  Patient presents with   Fall    BIB EMS from home C/O fall that resulted in small head laceration. Sts he fell at the restroom. Denies LOC. On eliquis . Uses 3LNC at baseline     Fall   82 year old male with history of hypertension, COPD, chronic hypoxic respiratory failure on 3 L nasal cannula, CAD, HFrEF, V. tach with ICD placement presents for evaluation after a fall.  Patient states that he was getting ready to to urinate, however he states that he became lightheaded/woozy and then fell to his knees, hit his head on the toilet seat.  On Eliquis .  Denies LOC.  States he was unable to get up, called EMS and brought him to the emergency room for further evaluation.  He currently denies any complaints including chest pain or shortness of breath, difficulty breathing, or any other concerns at this time.  He does state that he was recently admitted to [facility] for pneumonia, and was supposed to be discharged to rehab facility, however he chose to go home with home health given that his wife is also at rehab.  He states that he regrets not going to rehab, and thinks that he would like to go to rehab, does not feel that he is ready to go home.  He states that he feels unstable on his feet, though this has been the case for quite some time now.    Past Medical History:  Diagnosis Date   CHF (congestive heart failure) (*)    COPD (chronic obstructive pulmonary disease) (*)    GERD (gastroesophageal reflux disease)    Hypertension     Past Surgical History:  Procedure Laterality Date   Cardiac defibrillator placement     Coronary angioplasty with stent placement     Hernia repair     Right hip surgery      Social History   Substance and Sexual Activity  Alcohol  Use Never   Tobacco Use History[1] E-Cigarettes    Vaping Use     Start Date     Cartridges/Day     Quit Date     Social History   Substance and Sexual Activity  Drug Use Never         Allergies[2]  Home Medications   ACETAMINOPHEN  (TYLENOL ) 325 MG TABLET    Take two tablets (650 mg dose) by mouth 3 (three) times a day.   ALBUTEROL  SULFATE (PROVENTIL ) 2.5 MG/3 ML NEBULIZER SOLUTION    Inhale 3 mLs (2.5 mg dose) into the lungs every 6 (six) hours as needed.   AMIODARONE  (PACERONE ) 200 MG TABLET    Take one tablet (200 mg dose) by mouth daily.   APIXABAN  (ELIQUIS ) 5 MG TABLET    Take one tablet (5 mg dose) by mouth.   ARTIFICIAL TEAR OP    Apply 2 drops to eye daily as needed.   ASCORBIC ACID (VITAMIN C) 500 MG CPCR       ASCORBIC ACID (VITAMIN C) TABLET    Take one tablet (500 mg dose) by mouth daily.   ASCORBIC ACID (VITAMIN C) TABLET    Take one tablet (500 mg dose) by mouth daily.   ASPIRIN  (ECOTRIN LOW DOSE) EC TABLET    Take one tablet (81 mg dose) by mouth daily.   ATORVASTATIN  (LIPITOR ) 80 MG TABLET  BUPROPION  (WELLBUTRIN ) 75 MG TABLET    Take one tablet (75 mg dose) by mouth daily.   BUSPIRONE  (BUSPAR ) 15 MG TABLET    Take one tablet (15 mg dose) by mouth.   CALCIUM  CITRATE (CALCITRATE,CITRACEL) 950 (200 CA) MG TABLET    Take one tablet (950 mg dose) by mouth daily.   CARVEDILOL  (COREG ) 3.125 MG TABLET    Take one tablet (3.125 mg dose) by mouth.   CHOLECALCIFEROL  50 MCG (2000 UT) TABS    Take one tablet by mouth daily.   CHOLINE FENOFIBRATE  (FENOFIBRIC ACID ) 135 MG CPDR    Take one capsule (135 mg dose) by mouth daily.   CYANOCOBALAMIN  (VITAMIN B-12) 5000 MCG SUBL    Place one tablet under the tongue daily.   DICLOFENAC SODIUM (VOLTAREN) 1 % GEL    Apply two g topically 4 (four) times a day as needed.   DOCUSATE SODIUM  (COLACE,DOK,DOCQLACE) CAPSULE    Take one capsule (100 mg dose) by mouth daily.   ENTRESTO  24-26 MG TABS PER TABLET    Take by mouth.   ESCITALOPRAM  (LEXAPRO ) 5 MG TABLET    Take one tablet (5 mg  dose) by mouth daily.   FENOFIBRIC ACID  (FIBRICOR ) 35 MG TABLET    Take one tablet (35 mg dose) by mouth.   FERROUS SULFATE  324 MG TBEC    Take 1 capsule by mouth daily.   FLUTICASONE  PROPIONATE (FLONASE ) 50 MCG/ACTUATION NASAL SPRAY    two sprays by Both Nostrils route daily.   FUROSEMIDE  (LASIX ) 20 MG TABLET    Take one tablet (20 mg dose) by mouth.   GABAPENTIN  (NEURONTIN ) 300 MG CAPSULE    Take one capsule (300 mg dose) by mouth.   GUAIFENESIN  (MUCINEX ) 600 MG 12 HR TABLET    Take one tablet (600 mg dose) by mouth 2 (two) times a day as needed.   HYDROCODONE -ACETAMINOPHEN  (NORCO) 5-325 MG PER TABLET    Take one half tablet by mouth every 6 (six) hours as needed.   IPRATROPIUM-ALBUTEROL  (COMBIVENT  RESPIMAT) 20-100 MCG/ACT INHALER    Inhale one puff into the lungs 4 (four) times a day as needed.   JARDIANCE  10 MG TABS TABLET    Take one tablet (10 mg dose) by mouth daily.   LIDOCAINE  (LIDODERM ) 5%    one patch daily.   MAGNESIUM  OXIDE 400 TABS    Take one tablet (400 mg dose) by mouth 2 (two) times daily.   MEXILETINE HCL (MEXITIL ) 200 MG CAPSULE    SMARTSIG:Capsule(s) By Mouth   MULTIPLE VITAMINS-MINERALS (CENTRUM ADULTS PO)    Take 1 tablet by mouth daily.   MUPIROCIN  (BACTROBAN ) 2 % OINTMENT    Apply one Application topically daily.   NITROGLYCERIN  (NITROSTAT ) 0.4 MG SL TABLET    Place one tablet (0.4 mg dose) under the tongue every 5 (five) minutes as needed.   OMEPRAZOLE (PRILOSEC) 20 MG CAPSULE    Take one capsule (20 mg dose) by mouth daily.   POLYETHYLENE GLYCOL (MIRALAX ) 17 G PACKET    Take seventeen g by mouth daily.   ROSUVASTATIN  CALCIUM  (CRESTOR ) 40 MG TABLET    Take one tablet (40 mg dose) by mouth daily.   SENNOSIDES-DOCUSATE SODIUM  8.6-50 MG CAPS    Take 2 capsules by mouth with breakfast as needed.   SPIRONOLACTONE  (ALDACTONE ) 25 MG TABLET    Take one tablet (25 mg dose) by mouth daily.   STIOLTO RESPIMAT 2.5-2.5 MCG/ACT INHALER    SMARTSIG:2 Puff(s) Via Inhaler Daily  TAMSULOSIN  (FLOMAX ) 0.4 MG CAPS    Take one capsule (0.4 mg dose) by mouth daily.    Primary Survey   Exposure     No visible trauma noted on back exam.      Review of Systems   Review of Systems  All other systems reviewed and are negative.   Physical Exam   ED Triage Vitals [03/26/24 2017]  BP 109/64  Heart Rate 66  Resp 16  SpO2 91 %  Temp 97.7 F (36.5 C)    Physical Exam  Nursing note and vitals reviewed. Constitutional: He appears well-developed and well-nourished. He does not appear ill and no respiratory distress.  HENT:  Head: Normocephalic.  Abrasion noted to crown of head, no lac  Eyes: EOM are intact. Pupils are equal, round, and reactive to light.  Neck: Normal range of motion. Neck supple.  Cardiovascular: Normal rate, regular rhythm and normal heart sounds.  No audible murmur. No friction rub.  Pulmonary/Chest: No respiratory distress. Good air movement. Not tachypneic. Respiratory effort normal and breath sounds normal.  Abdominal: Soft. There is no abdominal tenderness.  Musculoskeletal: No visible trauma noted on back exam.     Cervical back: Normal range of motion and neck supple.     Comments: No midline C, T, L-spine tenderness to palpation. 5/5 strength in bilateral upper and lower extremities.  Able to flex and extend hips without difficulty.   Neurological: He is alert and oriented to person, place, and time. Moves all extremities equally.  Skin: Skin is warm.  Scattered ecchymosis in bilateral upper and lower extremities that patient attributes to chronic Eliquis  use.  Psychiatric: He has a normal mood and affect.     ED Course   Lab results:   CBC AND DIFFERENTIAL - Abnormal      Result Value   WBC 5.2     RBC 4.16 (*)    HGB 12.2 (*)    HCT 39.7 (*)    MCV 95.4 (*)    MCH 29.3     MCHC 30.7 (*)    Plt Ct 221     RDW SD 68.5 (*)    MPV 9.9     NRBC% 0.4 (*)    Absolute NRBC Count 0.02 (*)    NEUTROPHIL % 77.3      LYMPHOCYTE % 8.3     MONOCYTE % 7.3     Eosinophil % 0.0     BASOPHIL % 2.5     IG% 4.6     ABSOLUTE NEUTROPHIL COUNT 4.01     ABSOLUTE LYMPHOCYTE COUNT 0.43 (*)    Absolute Monocyte Count 0.38     Absolute Eosinophil Count 0.00     Absolute Basophil Count 0.13     Absolute Immature Granulocyte Count 0.24 (*)   COMPREHENSIVE METABOLIC PANEL - Abnormal   Na 132 (*)    Potassium 4.5     Cl 90 (*)    CO2 32     AGAP 10     Glucose 132 (*)    BUN 23     Creatinine 0.70 (*)    Ca 9.8     ALK PHOS 58     T Bili 1.5 (*)    Total Protein 7.0     Alb 3.6     GLOBULIN 3.4     ALBUMIN/GLOBULIN RATIO 1.1     BUN/CREAT RATIO 32.9 (*)    ALT 22     AST 18  eGFR 90     Comment: Normal GFR (glomerular filtration rate) > 60 mL/min/1.73 meters squared, < 60 may include impaired kidney function. Calculation based on the Chronic Kidney Disease Epidemiology Collaboration (CK-EPI)equation refit without adjustment for race.  GEN5 CARDIAC TROPONIN T (TNT5) BASELINE - Normal   TnT-Gen5 (0hr) 19     Comment: An elevated Troponin indicates myocardial damage. Elevated troponin may also be due to pulmonary emboli, aortic dissection, heart failure, trauma, toxins and ischemia in the setting of critical illness.   MAGNESIUM  - Normal   Mg 2.4    GEN5 CARDIAC TROPONIN T(TNT5) 1 HOUR - Normal   TnT-Gen5 (1hr) 19     Comment: An elevated Troponin indicates myocardial damage. Elevated troponin may also be due to pulmonary emboli, aortic dissection, heart failure, trauma, toxins and ischemia in the setting of critical illness.   Delta 1 Hour 0    GEN5 CARDIAC TROPONIN T(TNT5) 3 HOUR - Normal   TnT-Gen5 (3hr) 18     Comment: An elevated Troponin indicates myocardial damage. Elevated troponin may also be due to pulmonary emboli, aortic dissection, heart failure, trauma, toxins and ischemia in the setting of critical illness.   Delta 3 Hour -1      Imaging:   XR CHEST PA AND LATERAL   Narrative:     COMPARISON: 11/09/2021   INDICATION:Trauma Head  TECHNIQUE:  CT HEAD WO CONTRAST, XR CHEST PA AND LATERAL, CT SPINE CERVICAL WO CONTRAST  FINDINGS:   CT HEAD  No intracranial hemorrhage identified.   No midline shift or acute mass effect.   No abnormal extra-axial fluid collections are seen.   Ventricles, cisterns, and sulci are unremarkable.  Moderate periventricular and deep white matter hypodensities, statistically small vessel ischemic disease.   Old bilateral cerebellar infarcts, right greater left. Visualized orbits are unremarkable.   Visualized paranasal sinuses are clear.   Visualized mastoid air cells are clear.   No displaced or depressed calvarial fracture identified.  CT SPINE CERVICAL  No acute fracture or acute traumatic malalignment identified.   Vertebral body heights are intact.  Moderate multilevel spondylosis.  No paraspinal hematoma identified. Right-sided vagus nerve stimulator. Emphysema.  XR CHEST Right-sided vagus nerve stimulator. Left subclavian dual-lead pacemaker. Unremarkable cardiomediastinal silhouette.   No pulmonary venous congestion. New bilateral perihilar infiltrates, left greater than right.  No pneumothorax identified.     Impression:    IMPRESSION:  CT HEAD 1.  No acute intracranial abnormality identified. 2.  No calvarial fracture identified.  CT SPINE CERVICAL  1.  No acute fracture or acute traumatic malalignment identified.  2.  No paraspinal hematoma identified.  XR CHEST 1.  New bilateral perihilar infiltrates, left greater than right.  2.  Pacemaker.    Electronically Signed by: Charlie Patch, MD on 03/26/2024 9:48 PM  CT HEAD WO CONTRAST   Narrative:    COMPARISON: 11/09/2021   INDICATION:Trauma Head  TECHNIQUE:  CT HEAD WO CONTRAST, XR CHEST PA AND LATERAL, CT SPINE CERVICAL WO CONTRAST  FINDINGS:   CT HEAD  No intracranial hemorrhage identified.   No midline shift or acute mass effect.   No abnormal  extra-axial fluid collections are seen.   Ventricles, cisterns, and sulci are unremarkable.  Moderate periventricular and deep white matter hypodensities, statistically small vessel ischemic disease.   Old bilateral cerebellar infarcts, right greater left. Visualized orbits are unremarkable.   Visualized paranasal sinuses are clear.   Visualized mastoid air cells are clear.   No displaced or depressed  calvarial fracture identified.  CT SPINE CERVICAL  No acute fracture or acute traumatic malalignment identified.   Vertebral body heights are intact.  Moderate multilevel spondylosis.  No paraspinal hematoma identified. Right-sided vagus nerve stimulator. Emphysema.  XR CHEST Right-sided vagus nerve stimulator. Left subclavian dual-lead pacemaker. Unremarkable cardiomediastinal silhouette.   No pulmonary venous congestion. New bilateral perihilar infiltrates, left greater than right.  No pneumothorax identified.     Impression:    IMPRESSION:  CT HEAD 1.  No acute intracranial abnormality identified. 2.  No calvarial fracture identified.  CT SPINE CERVICAL  1.  No acute fracture or acute traumatic malalignment identified.  2.  No paraspinal hematoma identified.  XR CHEST 1.  New bilateral perihilar infiltrates, left greater than right.  2.  Pacemaker.    Electronically Signed by: Charlie Patch, MD on 03/26/2024 9:48 PM  CT SPINE CERVICAL WO CONTRAST   Narrative:    COMPARISON: 11/09/2021   INDICATION:Trauma Head  TECHNIQUE:  CT HEAD WO CONTRAST, XR CHEST PA AND LATERAL, CT SPINE CERVICAL WO CONTRAST  FINDINGS:   CT HEAD  No intracranial hemorrhage identified.   No midline shift or acute mass effect.   No abnormal extra-axial fluid collections are seen.   Ventricles, cisterns, and sulci are unremarkable.  Moderate periventricular and deep white matter hypodensities, statistically small vessel ischemic disease.   Old bilateral cerebellar infarcts, right greater  left. Visualized orbits are unremarkable.   Visualized paranasal sinuses are clear.   Visualized mastoid air cells are clear.   No displaced or depressed calvarial fracture identified.  CT SPINE CERVICAL  No acute fracture or acute traumatic malalignment identified.   Vertebral body heights are intact.  Moderate multilevel spondylosis.  No paraspinal hematoma identified. Right-sided vagus nerve stimulator. Emphysema.  XR CHEST Right-sided vagus nerve stimulator. Left subclavian dual-lead pacemaker. Unremarkable cardiomediastinal silhouette.   No pulmonary venous congestion. New bilateral perihilar infiltrates, left greater than right.  No pneumothorax identified.     Impression:    IMPRESSION:  CT HEAD 1.  No acute intracranial abnormality identified. 2.  No calvarial fracture identified.  CT SPINE CERVICAL  1.  No acute fracture or acute traumatic malalignment identified.  2.  No paraspinal hematoma identified.  XR CHEST 1.  New bilateral perihilar infiltrates, left greater than right.  2.  Pacemaker.    Electronically Signed by: Charlie Patch, MD on 03/26/2024 9:48 PM     ECG: ECG Results   None                                                                        Pre-Sedation Procedures  ED Course as of 03/27/24 0651  Sydell A Khaja's Documentation  Sun Mar 26, 2024  2207 XR Chest Pa And Lateral IMPRESSION:  CT HEAD 1.  No acute intracranial abnormality identified. 2.  No calvarial fracture identified.   CT SPINE CERVICAL  1.  No acute fracture or acute traumatic malalignment identified.  2.  No paraspinal hematoma identified.   XR CHEST 1.  New bilateral perihilar infiltrates, left greater than right.  2.  Pacemaker.   2210 Given reassuring workup including negative CT head, normal troponin, and no other significant abnormalities, no clear indication for admission at  this time.  However will keep  patient here overnight for evaluation by PT/OT/CM tomorrow for placement.  2215 Home meds, diet ordered. PT/OT/CM consults.    Medical Decision Making 83 year old male with history of hypertension, COPD, chronic hypoxic respiratory failure on 3 L nasal cannula, CAD, HFrEF, V. tach with ICD placement presents for evaluation after a fall.  Was about to urinate, became lightheaded, fell to his knees and hit his head on the toilet seat, no LOC.  On exam he appears chronically ill, though nontoxic and in no acute distress.  He is alert and oriented, with no focal neurological deficit.  Satting at goal saturation of greater than 88% on home 3 L nasal cannula.  No obvious wheezing, no respiratory distress, no abnormal focal lung sounds appreciated on exam.  Recent admission at [facility] for pneumonia, states that he was supposed to be discharged to rehab facility, however he had declined and opted to go home with home health given that his wife is at a rehab facility at this time.  He states that he feels like he needs to go to a rehab facility at this time, and does not feel like he can take care of himself at home.  He request case management evaluation.  Will initiate broad workup for his lightheadedness and fatigue including send electrolytes, serial troponins, EKG, chest x-ray given his recent pneumonia, and CT head and cervical spine to evaluate for intracranial pathology given that he is on Eliquis .  See ED course for results and disposition.  Amount and/or Complexity of Data Reviewed Labs: ordered. Radiology: ordered. Decision-making details documented in ED Course. ECG/medicine tests: ordered.  Risk Prescription drug management.          Provider Communication  New Prescriptions   No medications on file    Modified Medications   No medications on file    Discontinued Medications   No medications on file    Clinical Impression Final diagnoses:  Fall, initial  encounter  Closed head injury, initial encounter    ED Disposition     None                 Electronically signed by:    Sydell DELENA Sartorius, MD 03/26/24 2215       [1] Social History Tobacco Use  Smoking Status Never  Smokeless Tobacco Never  [2] Allergies Allergen Reactions   Sulfa Antibiotics Hives   Sydell DELENA Sartorius, MD 03/27/24 9347  "

## 2024-03-27 NOTE — Progress Notes (Addendum)
 [facility] HEALTH [facility] Case Management Assessment and Discharge Note   Patient:   Steven Guzman MR Number:  [id number] Patient Date of Birth: 1941-01-11 Age/Sex:  83 y.o./male  Assessment   Discussed role of CM and completed assessment with: Patient, Chart Review Patient: At bedside Are you a veteran?: Yes Are you seeing a PCP with the [facility]?: No Do you have a PCP and CM verified PCP?: Yes  High risk category: Advanced Age/Over 80, Chronic disease, Frequent recent fall(s)    Is this an unplanned 30 day readmission?: Yes Readmitted from:: Home w/HH Why do you think you were readmitted to the hospital?: Other (comment) (Fall) Is there anything we - or anyone - could have done to help you after you left the hospital so that you might not have needed to return soon?: No Did you get help outside the hospital from any provider before you came back to the hospital?: No  Patient admitted from: Patient's Home - Lives with Others    Functional status prior to admission: Requires occasional supervision and assistance    Court Appointed Guardian: No        Support Services in Place: Home Health Type of Home Health Service: Physical Therapy, Nursing, Occupational Therapy Home Health Agency Name: [facility]            Home Living  Type of Home: Apartment Home Layout: One level Bathroom Shower/Tub: Engineer, Manufacturing Systems: Engineer, Maintenance: Grab bars in shower Home Equipment: 4 wheeled walker, Grab bars, Single point cane Mode of Transportation: Restaurant Manager, Fast Food used at [employer] equipment used at home: [employer], Oxygen  02 Provider Company: [employer]  Frequency: As needed Oxygen  Liters: 3 L  Social Drivers of Health  In the past 12 months, has lack of transportation kept you from medical appointments or from getting medications?: No In the past 12 months, has lack of transportation kept you from meetings,  work, or from getting things needed for daily living?: No Income source: SSI/Pension/Retirement Any concerns managing your health?: Yes, CM concern  Select concerns managing health: Other (comment), Food Insecurity (SDOH) (Falls)  Assessment and Plan  Current Hospital Functional Status: Requires occassional supervision and assistance Anticipated Disposition: Patient's Home-Alone Anticipated needs: Home Health, Home Caregiver Assistance   Discharge Plan   Case Management interviewed: Patient Disposition: Home Health   Home Health/Other Discharge Services: OT, PT, RN OT services: Arranged PT Services: Arranged RN Services: Arranged   Home Health CM discussed geographic area, insurance, NH financial affiliation, NH PACN, patient choice and agency will discuss cost invloved, if applicable.: No Home Health agency list was: Other (comment) (Patient linked with agency after hospital discharge with [facility] Home Health Liaison Name: [facility] Liaison Phone Number: [phone] Home Health Services accepted, selected and confirmation received through NH Link: Yes Home Health start of care date provided by agency:  (within 48 hours) Home Health Agency Services start of care date was provided to: Patient                                               Discussed readiness, willingness and ability to provide or support self-management activities when needed after discharge from the acute care setting with: Patient       Transportation   Does the patient need discharge transport arranged?: Yes Discharge Transportation: Ambulance Has  medical necessity form been completed?: Yes Has discharge transport been arranged?: Yes Agency Name: [facility] Agency Phone Number: [phone] Date of transport: 03/27/24 What time is transport scheduled?: 1515      Accepted Agency   Selected Continued Care - Discharged on 03/27/2024 Admission date: 03/26/2024 - Discharge  disposition: Home or Self Care  No services have been selected for the patient.    CM met with patient at bedside for consult. Patient consulted for discharge planning. Patient presents to ED after fall. Patient reports he was feeling weak due to not eating. Patient was recently referred to [facility] services with [facility] by [facility]. CM spoke to [facility], [place], [phone] and patient was referred for OT/PT.  She requested addition of HH RN. Requested HH order from Attending [facility]. Referral sent through [facility] in-basket for [facility]. Patient is concerned for wife at [facility] who is scheduled to discharge today due to being in copay days.  Patient has $400 to last until his wife's deposit hits on 1/6. Presenter, Broadcasting added to AVS.  Reapplied for Meals on Wheels for patient who reports he applied about 6 months ago and never heard anything. Spoke to [facility] to see if patient had any transportation or food benefits, ref: [id number].  Patient does not have additional benefits from [facility]. Spoke to [facility] transfer coordinator, April and patient is not service connected and therefore does not qualify for Aide & Attendance services. Patient requires oxygen , so [facility] to transport at 3:15PM. The level of care algorithm has been used for appropriate discharge planning. Case Management has assessed this patient/family or caregiver's readiness, willingness and ability to provide or support self-management activities when needed after discharge from the acute care setting.   Electronically signed: Melissa D Jacobs, MSW 03/27/2024 3:17 PM

## 2024-03-27 NOTE — Discharge Summary (Signed)
 [facility] HEALTH [facility] ED Discharge Summary  PCP: Morton JONELLE Bellis, [name] Discharge Details   ED Admit date:        03/26/2024 ED Discharge date:        03/27/2024  ED LOS:    1 days  Final diagnoses:  Fall, initial encounter  Closed head injury, initial encounter      Current Discharge Medication List    You have not been prescribed any medications.          Hospital Course  Physicians involved in care during this hospitalization Attending Provider: Sydell DELENA Sartorius, MD Attending Provider: Margaretmary DELENA Haley, MD Attending Provider: Therisa KANDICE Silvan, MD Attending Provider: Therisa KANDICE Silvan, MD Consulting Physician: Yancy Sorrel, MD  Indication for ED stay: Patient has multiple chronic medical conditions and came in for a fall.  While in the emergency department, he voiced concerns about his living situation.  He voiced concerns about he and his wife's ability to care for themselves. ED clinical course: Patient was seen by case management, PT, and OT.  Multiple needs were identified including food and finances.  Resources were given, and the patient was also given referrals for a home health nurse and home PT and OT.  He felt comfortable with discharge home.  ED Course as of 03/27/24 1457  Others' Documentation  Sun Mar 26, 2024  2207 XR Chest Pa And Lateral IMPRESSION:  CT HEAD 1.  No acute intracranial abnormality identified. 2.  No calvarial fracture identified.   CT SPINE CERVICAL  1.  No acute fracture or acute traumatic malalignment identified.  2.  No paraspinal hematoma identified.   XR CHEST 1.  New bilateral perihilar infiltrates, left greater than right.  2.  Pacemaker.  [FK]  2210 Given reassuring workup including negative CT head, normal troponin, and no other significant abnormalities, no clear indication for admission at this time.  However will keep patient here overnight for evaluation by PT/OT/CM tomorrow for placement. [FK]  2215 Home meds,  diet ordered. PT/OT/CM consults.  [FK]    ED Course User Index [FK] Sydell DELENA Sartorius, MD    Bedside Procedures   No orders found     [facility]    Other Instructions     Ambulatory Referral to Home Health      Home Health Face-to-Face:   This is to certify that this patient is under the care of a physician and that the physician, or a nurse practitioner or physician assistant working with said physician, had a face-to-face encounter that meets the physician face-to-face encounter requirements with this patient on date listed in order questions section.   If this patient is a Medicare patient, I certify that my clinical findings support the patient is homebound (i.e. absences from home require considerable and taxing effort and are for medical reasons or religious services or infrequently or of short duration when for other reasons). If this patient is a Medicaid patient, I certify that the home is the most appropriate setting for services.  This also certifies that home health intermittent skilled nursing care, physical therapy and/or speech language pathology services are medically necessary to provide the care and treatments as identified.                 Home Health Disciplines:  Skilled Nursing Physical Therapy Occupational Therapy     Reason for Skilled Nursing (enter specifics in comment boxes): Disease/Medication Management   Home health services are needed due  to:  alteration in ability to perform self care related to condition balance issues, frequent falls, risk for falls related to condition difficulty with ambulation/transfers due to condition     This certifies this patient requires services in the home due to: motor or sensory impairments that make it difficult or taxing to leave home   Referral Type: Home Health Care   Evaluate and Return         Recommendations to physicians: none  Code Status:   No Order   Electronically  signed: Therisa Silvan, MD 03/27/2024 / 2:57 PM

## 2024-03-28 ENCOUNTER — Other Ambulatory Visit: Payer: Self-pay

## 2024-03-28 ENCOUNTER — Emergency Department (HOSPITAL_COMMUNITY)

## 2024-03-28 ENCOUNTER — Inpatient Hospital Stay (HOSPITAL_COMMUNITY)
Admission: EM | Admit: 2024-03-28 | Discharge: 2024-04-07 | DRG: 308 | Disposition: A | Discharge status: Skilled Nursing Facility | Attending: Internal Medicine | Admitting: Internal Medicine

## 2024-03-28 DIAGNOSIS — Z85828 Personal history of other malignant neoplasm of skin: Secondary | ICD-10-CM

## 2024-03-28 DIAGNOSIS — Z7984 Long term (current) use of oral hypoglycemic drugs: Secondary | ICD-10-CM

## 2024-03-28 DIAGNOSIS — Z4502 Encounter for adjustment and management of automatic implantable cardiac defibrillator: Secondary | ICD-10-CM

## 2024-03-28 DIAGNOSIS — J449 Chronic obstructive pulmonary disease, unspecified: Secondary | ICD-10-CM | POA: Diagnosis present

## 2024-03-28 DIAGNOSIS — F419 Anxiety disorder, unspecified: Secondary | ICD-10-CM | POA: Diagnosis present

## 2024-03-28 DIAGNOSIS — Z8249 Family history of ischemic heart disease and other diseases of the circulatory system: Secondary | ICD-10-CM

## 2024-03-28 DIAGNOSIS — E43 Unspecified severe protein-calorie malnutrition: Secondary | ICD-10-CM | POA: Diagnosis present

## 2024-03-28 DIAGNOSIS — N183 Chronic kidney disease, stage 3 unspecified: Secondary | ICD-10-CM | POA: Diagnosis present

## 2024-03-28 DIAGNOSIS — Z681 Body mass index (BMI) 19 or less, adult: Secondary | ICD-10-CM

## 2024-03-28 DIAGNOSIS — Z79899 Other long term (current) drug therapy: Secondary | ICD-10-CM

## 2024-03-28 DIAGNOSIS — L89316 Pressure-induced deep tissue damage of right buttock: Secondary | ICD-10-CM | POA: Diagnosis present

## 2024-03-28 DIAGNOSIS — Z8709 Personal history of other diseases of the respiratory system: Secondary | ICD-10-CM

## 2024-03-28 DIAGNOSIS — Z95810 Presence of automatic (implantable) cardiac defibrillator: Secondary | ICD-10-CM

## 2024-03-28 DIAGNOSIS — Z8674 Personal history of sudden cardiac arrest: Secondary | ICD-10-CM

## 2024-03-28 DIAGNOSIS — D638 Anemia in other chronic diseases classified elsewhere: Secondary | ICD-10-CM | POA: Diagnosis present

## 2024-03-28 DIAGNOSIS — J9621 Acute and chronic respiratory failure with hypoxia: Secondary | ICD-10-CM | POA: Diagnosis not present

## 2024-03-28 DIAGNOSIS — I255 Ischemic cardiomyopathy: Secondary | ICD-10-CM | POA: Diagnosis present

## 2024-03-28 DIAGNOSIS — L8990 Pressure ulcer of unspecified site, unspecified stage: Secondary | ICD-10-CM | POA: Insufficient documentation

## 2024-03-28 DIAGNOSIS — R531 Weakness: Secondary | ICD-10-CM | POA: Diagnosis not present

## 2024-03-28 DIAGNOSIS — I4719 Other supraventricular tachycardia: Secondary | ICD-10-CM | POA: Diagnosis present

## 2024-03-28 DIAGNOSIS — I5022 Chronic systolic (congestive) heart failure: Secondary | ICD-10-CM | POA: Diagnosis present

## 2024-03-28 DIAGNOSIS — D631 Anemia in chronic kidney disease: Secondary | ICD-10-CM | POA: Diagnosis present

## 2024-03-28 DIAGNOSIS — Z882 Allergy status to sulfonamides status: Secondary | ICD-10-CM

## 2024-03-28 DIAGNOSIS — R627 Adult failure to thrive: Secondary | ICD-10-CM | POA: Diagnosis present

## 2024-03-28 DIAGNOSIS — Z7982 Long term (current) use of aspirin: Secondary | ICD-10-CM

## 2024-03-28 DIAGNOSIS — I472 Ventricular tachycardia, unspecified: Principal | ICD-10-CM | POA: Diagnosis present

## 2024-03-28 DIAGNOSIS — N400 Enlarged prostate without lower urinary tract symptoms: Secondary | ICD-10-CM | POA: Diagnosis present

## 2024-03-28 DIAGNOSIS — I4891 Unspecified atrial fibrillation: Secondary | ICD-10-CM | POA: Diagnosis present

## 2024-03-28 DIAGNOSIS — Z888 Allergy status to other drugs, medicaments and biological substances status: Secondary | ICD-10-CM

## 2024-03-28 DIAGNOSIS — Z7901 Long term (current) use of anticoagulants: Secondary | ICD-10-CM

## 2024-03-28 DIAGNOSIS — R079 Chest pain, unspecified: Principal | ICD-10-CM

## 2024-03-28 DIAGNOSIS — R54 Age-related physical debility: Secondary | ICD-10-CM | POA: Diagnosis present

## 2024-03-28 DIAGNOSIS — K219 Gastro-esophageal reflux disease without esophagitis: Secondary | ICD-10-CM | POA: Diagnosis present

## 2024-03-28 DIAGNOSIS — I2510 Atherosclerotic heart disease of native coronary artery without angina pectoris: Secondary | ICD-10-CM | POA: Diagnosis present

## 2024-03-28 DIAGNOSIS — J9811 Atelectasis: Secondary | ICD-10-CM | POA: Diagnosis present

## 2024-03-28 DIAGNOSIS — F17210 Nicotine dependence, cigarettes, uncomplicated: Secondary | ICD-10-CM | POA: Diagnosis present

## 2024-03-28 DIAGNOSIS — E785 Hyperlipidemia, unspecified: Secondary | ICD-10-CM | POA: Diagnosis present

## 2024-03-28 DIAGNOSIS — E876 Hypokalemia: Secondary | ICD-10-CM | POA: Diagnosis present

## 2024-03-28 DIAGNOSIS — I480 Paroxysmal atrial fibrillation: Secondary | ICD-10-CM | POA: Diagnosis present

## 2024-03-28 DIAGNOSIS — I130 Hypertensive heart and chronic kidney disease with heart failure and stage 1 through stage 4 chronic kidney disease, or unspecified chronic kidney disease: Secondary | ICD-10-CM | POA: Diagnosis present

## 2024-03-28 DIAGNOSIS — Z9842 Cataract extraction status, left eye: Secondary | ICD-10-CM

## 2024-03-28 DIAGNOSIS — Z9841 Cataract extraction status, right eye: Secondary | ICD-10-CM

## 2024-03-28 DIAGNOSIS — L89321 Pressure ulcer of left buttock, stage 1: Secondary | ICD-10-CM | POA: Diagnosis present

## 2024-03-28 DIAGNOSIS — Z86718 Personal history of other venous thrombosis and embolism: Secondary | ICD-10-CM

## 2024-03-28 DIAGNOSIS — Z881 Allergy status to other antibiotic agents status: Secondary | ICD-10-CM

## 2024-03-28 DIAGNOSIS — R5381 Other malaise: Secondary | ICD-10-CM

## 2024-03-28 DIAGNOSIS — F32A Depression, unspecified: Secondary | ICD-10-CM | POA: Diagnosis present

## 2024-03-28 DIAGNOSIS — I252 Old myocardial infarction: Secondary | ICD-10-CM

## 2024-03-28 DIAGNOSIS — Z961 Presence of intraocular lens: Secondary | ICD-10-CM | POA: Diagnosis present

## 2024-03-28 DIAGNOSIS — L89153 Pressure ulcer of sacral region, stage 3: Secondary | ICD-10-CM | POA: Diagnosis present

## 2024-03-28 DIAGNOSIS — Z8701 Personal history of pneumonia (recurrent): Secondary | ICD-10-CM

## 2024-03-28 LAB — CBC WITH DIFFERENTIAL/PLATELET
Basophils Absolute: 0 K/uL (ref 0.0–0.1)
Basophils Relative: 0 %
Eosinophils Absolute: 0 K/uL (ref 0.0–0.5)
Eosinophils Relative: 0 %
HCT: 40.8 % (ref 39.0–52.0)
Hemoglobin: 12.5 g/dL — ABNORMAL LOW (ref 13.0–17.0)
Lymphocytes Relative: 16 %
Lymphs Abs: 0.8 K/uL (ref 0.7–4.0)
MCH: 30.1 pg (ref 26.0–34.0)
MCHC: 30.6 g/dL (ref 30.0–36.0)
MCV: 98.3 fL (ref 80.0–100.0)
Monocytes Absolute: 0.6 K/uL (ref 0.1–1.0)
Monocytes Relative: 12 %
Neutro Abs: 3.5 K/uL (ref 1.7–7.7)
Neutrophils Relative %: 72 %
Platelets: 175 K/uL (ref 150–400)
RBC: 4.15 MIL/uL — ABNORMAL LOW (ref 4.22–5.81)
RDW: 20.9 % — ABNORMAL HIGH (ref 11.5–15.5)
WBC: 4.8 K/uL (ref 4.0–10.5)
nRBC: 0.4 % — ABNORMAL HIGH (ref 0.0–0.2)

## 2024-03-28 LAB — BASIC METABOLIC PANEL WITH GFR
Anion gap: 10 (ref 5–15)
BUN: 16 mg/dL (ref 8–23)
CO2: 30 mmol/L (ref 22–32)
Calcium: 8.8 mg/dL — ABNORMAL LOW (ref 8.9–10.3)
Chloride: 91 mmol/L — ABNORMAL LOW (ref 98–111)
Creatinine, Ser: 0.69 mg/dL (ref 0.61–1.24)
GFR, Estimated: >60 mL/min
Glucose, Bld: 99 mg/dL (ref 70–99)
Potassium: 4.1 mmol/L (ref 3.5–5.1)
Sodium: 132 mmol/L — ABNORMAL LOW (ref 135–145)

## 2024-03-28 LAB — PRO BRAIN NATRIURETIC PEPTIDE: Pro Brain Natriuretic Peptide: 791 pg/mL — ABNORMAL HIGH

## 2024-03-28 LAB — PROCALCITONIN: Procalcitonin: <0.1 ng/mL

## 2024-03-28 LAB — I-STAT CG4 LACTIC ACID, ED: Lactic Acid, Venous: 1.1 mmol/L (ref 0.5–1.9)

## 2024-03-28 LAB — TROPONIN T, HIGH SENSITIVITY
Troponin T High Sensitivity: 26 ng/L — ABNORMAL HIGH (ref 0–19)
Troponin T High Sensitivity: 26 ng/L — ABNORMAL HIGH (ref 0–19)

## 2024-03-28 LAB — MAGNESIUM: Magnesium: 2.1 mg/dL (ref 1.7–2.4)

## 2024-03-28 MED ORDER — AMIODARONE HCL IN DEXTROSE 360-4.14 MG/200ML-% IV SOLN
60.0000 mg/h | INTRAVENOUS | Status: AC
  Administered 2024-03-28 – 2024-03-29 (×2): 60 mg/h via INTRAVENOUS
  Filled 2024-03-28 (×2): qty 200

## 2024-03-28 MED ORDER — CARVEDILOL 3.125 MG PO TABS
3.1250 mg | ORAL_TABLET | Freq: Two times a day (BID) | ORAL | Status: DC
  Administered 2024-03-29: 3.125 mg via ORAL
  Filled 2024-03-28 (×2): qty 1

## 2024-03-28 MED ORDER — MELATONIN 3 MG PO TABS
3.0000 mg | ORAL_TABLET | Freq: Every evening | ORAL | Status: DC | PRN
  Administered 2024-04-01: 3 mg via ORAL
  Filled 2024-03-28: qty 1

## 2024-03-28 MED ORDER — ACETAMINOPHEN 650 MG RE SUPP: 650.0000 mg | Freq: Four times a day (QID) | RECTAL | Status: DC | PRN

## 2024-03-28 MED ORDER — CHLORHEXIDINE GLUCONATE CLOTH 2 % EX PADS: 6.0000 | MEDICATED_PAD | Freq: Every day | CUTANEOUS | Status: DC

## 2024-03-28 MED ORDER — ACETAMINOPHEN 325 MG PO TABS
650.0000 mg | ORAL_TABLET | Freq: Four times a day (QID) | ORAL | Status: DC | PRN
  Administered 2024-04-05: 650 mg via ORAL
  Filled 2024-03-28: qty 2

## 2024-03-28 MED ORDER — AMIODARONE HCL IN DEXTROSE 360-4.14 MG/200ML-% IV SOLN
30.0000 mg/h | INTRAVENOUS | Status: DC
  Administered 2024-03-29 – 2024-03-30 (×2): 30 mg/h via INTRAVENOUS
  Filled 2024-03-28 (×2): qty 200

## 2024-03-28 MED ORDER — APIXABAN 5 MG PO TABS
5.0000 mg | ORAL_TABLET | Freq: Two times a day (BID) | ORAL | Status: DC
  Administered 2024-03-28 – 2024-04-07 (×20): 5 mg via ORAL
  Filled 2024-03-28: qty 2
  Filled 2024-03-28 (×19): qty 1

## 2024-03-28 MED ORDER — IPRATROPIUM-ALBUTEROL 0.5-2.5 (3) MG/3ML IN SOLN: 3.0000 mL | Freq: Four times a day (QID) | RESPIRATORY_TRACT | Status: DC | PRN

## 2024-03-28 MED ORDER — FUROSEMIDE 40 MG PO TABS
40.0000 mg | ORAL_TABLET | Freq: Every day | ORAL | Status: DC
  Administered 2024-03-29 – 2024-04-01 (×4): 40 mg via ORAL
  Filled 2024-03-28: qty 2
  Filled 2024-03-28 (×3): qty 1

## 2024-03-28 MED ORDER — ENOXAPARIN SODIUM 40 MG/0.4ML IJ SOSY: 40.0000 mg | PREFILLED_SYRINGE | INTRAMUSCULAR | Status: DC

## 2024-03-28 MED ORDER — AMIODARONE LOAD VIA INFUSION
150.0000 mg | Freq: Once | INTRAVENOUS | Status: AC
  Administered 2024-03-28: 150 mg via INTRAVENOUS
  Filled 2024-03-28: qty 83.34

## 2024-03-28 MED ORDER — MEXILETINE HCL 200 MG PO CAPS
200.0000 mg | ORAL_CAPSULE | Freq: Two times a day (BID) | ORAL | Status: DC
  Administered 2024-03-28 – 2024-04-07 (×20): 200 mg via ORAL
  Filled 2024-03-28 (×21): qty 1

## 2024-03-28 MED ORDER — ONDANSETRON HCL 4 MG/2ML IJ SOLN: 4.0000 mg | Freq: Four times a day (QID) | INTRAMUSCULAR | Status: DC | PRN

## 2024-03-28 MED ORDER — POLYETHYLENE GLYCOL 3350 17 G PO PACK: 17.0000 g | PACK | Freq: Every day | ORAL | Status: DC | PRN

## 2024-03-28 MED ORDER — SENNA 8.6 MG PO TABS: 1.0000 | ORAL_TABLET | Freq: Two times a day (BID) | ORAL | Status: DC | PRN

## 2024-03-28 NOTE — Progress Notes (Addendum)
 Patient is a wonderful 83 yo m with recent hospitalization here for CHF (discharged 12/26) who presents to the ED from home for evaluation after being shocked by his pacemaker. Previous hospitalization significant for attempts to reach SNF placement at [facility]; initial auth denied but was approved upon P2P, patient ultimately elected, however, to discharge home as his wife, Inocente, was scheduled to be returning home from SNF [facility]) and needed his support. Inocente returned home yesterday and patient reports significant difficulty caring for her, limited by ambulatory deficits he would have otherwise been addressing at [facility]. Moreover, he reports difficulty in meeting his own ADL's. Describes deficits with strength and balance. Was unable to leave the home and procure food for them last night and this morning. States house has become a hoarding situation. He presents this afternoon agreeable and motivated to seek SNF placement, hoping to eventually return home and care for his spouse again.  Will begin ED SNF process. Spoke to attending, PT/OT will be ordered.    Since returning home yesterday, Inocente has essentially been stuck in the recliner that EMS dropped her off in. The patient states she was wearing a depends but he was not able to change them. She typically utilizes two canes to ambulate but has been too fearful of falling to ambulate since returning home. He does not think she will be able to get up. He does not share a specific diagnosis, but believes she may have slight memory/cognitive impairments. We talked about her coming into the hospital to seek placement along with him, he said she would be very unhappy about it. Their sister-in-law is supposed to be coming over to check on her tonight. We discussed the possibility of her stepping up to support [name] briefly while he is rehabbing, but he was not sure if she would agree. There does not appear to be any other supports  available. We discussed that it would be appropriate to call a welfare check, which would likely result in her being brought to a hospital. He verbalized understanding and expressed agreeability. We ultimately agreed to wait for his sister-in-law to come over, assess the situation, and for them to talk through it. His phone is charging at the nurses station. He was made aware of appropriate means of contact should he want to discuss things further.   He has looked into several senior supports, assisted living coordinators, and support through the [place] - but it does not appear that these things could be arranged in a timely enough manner to meet his wife's needs. Encouragement provided to continue engaging with these supports for intermediate and long-term needs.   9:13pm: Patient was ultimately admitted to the hospital. Will complete TOC initial note. FL2 info was completed, holding off on signing or sending referrals at this time. Patient's spouse was brought to the hospital and is safe. Both will likely need SNF placement, would like placed at the same facility.

## 2024-03-28 NOTE — ED Triage Notes (Addendum)
 Pt Steven Guzman ems was using the bathroom and his pacemaker fired. Has not fired with ems. VSS. Pt has [employer] defibrillator. Pt reports having cp before his pacemaker fired and took 1 nitroglycerin  and then his pacemaker fired.

## 2024-03-28 NOTE — ED Notes (Signed)
 CCMD called.

## 2024-03-28 NOTE — ED Provider Notes (Signed)
 " [place] [facility] DE[facility] AT [facility] Provider Note   CSN: [id number] Arrival date & time: 03/28/24  1531     Patient presents with: Pacemaker Problem   Steven Guzman is a 83 y.o. male.   83 year old male with history of hypertension, COPD, chronic hypoxic respiratory failure on 3 L nasal cannula, CAD, HFrEF, V. tach with ICD placement presents for evaluation of chest pain. Pt states he had chest pain this morning that was described as short and minor. States he took a nitroglycerin  that he thinks was expired. No improvement of symptoms. States within 30 seconds to 1 min his pacemaker fired-approximately 2pm. States he has had no symptoms since. Denies a  hx of fevers, cough, sob, etc. Hx pertinent for recent discharge 2 days ago after hospitalization for weakness, fall, and pneumonia. States he is still feeling very weak.       The history is provided by the patient. No language interpreter was used.       Prior to Admission medications  Medication Sig Start Date End Date Taking? Authorizing Provider  acetaminophen  (TYLENOL ) 500 MG tablet Take 1,000 mg by mouth every 6 (six) hours as needed for moderate pain.    [provider]  Lactobacillus (ACIDOPHILUS) CAPS capsule Take 1 capsule by mouth 2 (two) times daily. 03/24/24 04/23/24  Christobal Guadalajara, MD  albuterol  (PROVENTIL ) (2.5 MG/3ML) 0.083% nebulizer solution USE 1 VIAL VIA NEBULIZER EVERY 6 HOURS AS NEEDED FOR WHEEZING OR SHORTNESS OF BREATH 07/18/19   Olalere, Adewale A, MD  amiodarone  (PACERONE ) 200 MG tablet Take 1 tablet (200 mg total) by mouth daily. \ 08/27/23   Lesia Ozell Barter, PA-C  Artificial Tear Solution (SOOTHE XP OP) Place 2 drops into both eyes daily as needed (dry eyes).    [provider]  Ascorbic Acid (VITAMIN C) 1000 MG tablet Take 1,000 mg by mouth daily.    [provider]  aspirin  EC 81 MG tablet Take 1 tablet (81 mg total) by mouth daily. Swallow whole.  03/21/22   Meng, Hao, PA  Biotin  5000 MCG CAPS Take 5,000 mcg by mouth daily.    [provider]  Calcium  Citrate-Vitamin D  (CALCIUM  + D PO) Take 1 tablet by mouth daily.    [provider]  carvedilol  (COREG ) 3.125 MG tablet Take 1 tablet (3.125 mg total) by mouth 2 (two) times daily. 04/26/23   Rana Lum CROME, NP  cetirizine (ZYRTEC) 10 MG tablet Take 10 mg by mouth daily.    [provider]  Cholecalciferol  50 MCG (2000 UT) TABS Take 2,000 Units by mouth daily at 6 (six) AM. 09/12/18   [provider]  Cyanocobalamin  (B12 LIQUID HEALTH BOOSTER PO) Take 5,000 mcg by mouth daily.    [provider]  Docusate Sodium  (DSS) 100 MG CAPS Take 2 capsules by mouth daily in the afternoon. 02/10/22   [provider]  ELIQUIS  5 MG TABS tablet TAKE 1 TABLET BY MOUTH TWICE A DAY 01/11/24   Croitoru, Mihai, MD  empagliflozin  (JARDIANCE ) 10 MG TABS tablet Take 1 tablet (10 mg total) by mouth daily. 04/19/23   Croitoru, Mihai, MD  escitalopram  (LEXAPRO ) 20 MG tablet Take 20 mg by mouth daily. 11/03/23   [provider]  Evolocumab  (REPATHA  SURECLICK) 140 MG/ML SOAJ INJECT 1 ML INTO THE SKIN EVERY 14 DAYS 01/10/24   Croitoru, Mihai, MD  feeding supplement (ENSURE PLUS HIGH PROTEIN) LIQD Take 237 mLs by mouth 2 (two) times daily between  meals for 7 days. 03/24/24 03/31/24  Christobal Guadalajara, MD  fluticasone  (FLONASE ) 50 MCG/ACT nasal spray Place 2 sprays into both nostrils daily. Patient taking differently: Place 2 sprays into both nostrils as needed for allergies. 02/13/21   Fargo, Amy E, NP  furosemide  (LASIX ) 40 MG tablet Take 1 tablet (40 mg total) by mouth daily. 03/05/24 06/03/24  Fairy Frames, MD  Guaifenesin  1200 MG TB12 Take 1,200 mg by mouth daily.    [provider]  Ipratropium-Albuterol  (COMBIVENT  RESPIMAT) 20-100 MCG/ACT AERS respimat Inhale 1 puff into the lungs every 6 (six) hours. Shortness of breath or wheezing Patient taking  differently: Inhale 1 puff into the lungs every 6 (six) hours as needed for wheezing or shortness of breath. Shortness of breath or wheezing 09/18/21   Fargo, Amy E, NP  IRON PO Take 1 tablet by mouth daily.    [provider]  lidocaine  (LIDODERM ) 5 % Place 1 patch onto the skin daily. Remove & Discard patch within 12 hours or as directed by MD 03/25/24   Christobal Guadalajara, MD  Magnesium  200 MG TABS Take 400 mg by mouth daily.    [provider]  melatonin 3 MG TABS tablet Take 3 mg by mouth at bedtime.    [provider]  mexiletine (MEXITIL ) 200 MG capsule TAKE 1 CAPSULE BY MOUTH 2 TIMES A DAY 02/17/24   Fountain, Madison L, NP  Multiple Vitamins-Minerals (CENTRUM ADULTS PO) Take 1 tablet by mouth daily.    [provider]  mupirocin  ointment (BACTROBAN ) 2 % Apply 1 Application topically daily as needed (wound care). 11/04/22   [provider]  nitroGLYCERIN  (NITROSTAT ) 0.4 MG SL tablet Place 1 tablet (0.4 mg total) under the tongue every 5 (five) minutes as needed for chest pain. DISSOLVE 1 TABLET UNDER THE TONGUE EVERY 5 MINUTES FOR 3 DOSES 12/15/23   Court Dorn PARAS, MD  omeprazole (PRILOSEC) 20 MG capsule Take 20 mg by mouth daily.     [provider]  potassium chloride  20 MEQ TBCR Take 1 tablet (20 mEq total) by mouth daily. 03/05/24 06/03/24  Fairy Frames, MD  predniSONE  (DELTASONE ) 10 MG tablet Take 2 tablets (20 mg total) by mouth daily with breakfast for 2 days, THEN 1 tablet (10 mg total) daily with breakfast for 2 days. 03/25/24 03/29/24  Christobal Guadalajara, MD  pregabalin  (LYRICA ) 25 MG capsule Take by mouth in the morning and at bedtime. 03/08/23   [provider]  Respiratory Therapy Supplies (FLUTTER) DEVI Use as directed 03/16/17   Alaine Vicenta NOVAK, MD  Spacer/Aero-Holding Chambers (OPTICHAMBER DIAMOND ) MISC optichamber [facility] 09/12/19   Olalere, Adewale A, MD  STIOLTO RESPIMAT 2.5-2.5 MCG/ACT AERS Inhale 2 puffs into the lungs daily.  09/22/21   [provider]  tamsulosin  (FLOMAX ) 0.4 MG CAPS capsule TAKE ONE CAPSULE BY MOUTH DAILY AFTER SUPPER 06/30/21   Fargo, Amy E, NP    Allergies: Sulfa antibiotics and Cephalexin     Review of Systems  Constitutional:  Negative for chills and fever.  HENT:  Negative for ear pain and sore throat.   Eyes:  Negative for pain and visual disturbance.  Respiratory:  Negative for cough and shortness of breath.   Cardiovascular:  Positive for chest pain. Negative for palpitations.  Gastrointestinal:  Negative for abdominal pain and vomiting.  Genitourinary:  Negative for dysuria and hematuria.  Musculoskeletal:  Negative for arthralgias and back pain.  Skin:  Negative for color change and rash.  Neurological:  Negative for  seizures and syncope.  All other systems reviewed and are negative.   Updated Vital Signs BP 110/64   Pulse 69   Temp 97.7 F (36.5 C) (Oral)   Resp (!) 31   SpO2 (!) 86%   Physical Exam Vitals and nursing note reviewed.  Constitutional:      General: He is not in acute distress.    Appearance: He is well-developed.  HENT:     Head: Normocephalic and atraumatic.  Eyes:     Conjunctiva/sclera: Conjunctivae normal.  Cardiovascular:     Rate and Rhythm: Normal rate and regular rhythm.     Heart sounds: No murmur heard. Pulmonary:     Effort: Pulmonary effort is normal. No respiratory distress.     Breath sounds: Normal breath sounds.  Abdominal:     Palpations: Abdomen is soft.     Tenderness: There is no abdominal tenderness.  Musculoskeletal:        General: No swelling.     Cervical back: Neck supple.     Right lower leg: No edema.     Left lower leg: No edema.  Skin:    General: Skin is warm and dry.     Capillary Refill: Capillary refill takes less than 2 seconds.     Comments: Ecchymosis bilateral upper and lower extremities. Non-tender  Neurological:     Mental Status: He is alert.  Psychiatric:        Mood and Affect: Mood  normal.     (all labs ordered are listed, but only abnormal results are displayed) Labs Reviewed  CBC WITH DIFFERENTIAL/PLATELET - Abnormal; Notable for the following components:      Result Value   RBC 4.15 (*)    Hemoglobin 12.5 (*)    RDW 20.9 (*)    nRBC 0.4 (*)    All other components within normal limits  BASIC METABOLIC PANEL WITH GFR - Abnormal; Notable for the following components:   Sodium 132 (*)    Chloride 91 (*)    Calcium  8.8 (*)    All other components within normal limits  TROPONIN T, HIGH SENSITIVITY - Abnormal; Notable for the following components:   Troponin T High Sensitivity 26 (*)    All other components within normal limits  MAGNESIUM   CBC WITH DIFFERENTIAL/PLATELET  COMPREHENSIVE METABOLIC PANEL WITH GFR  MAGNESIUM   PROCALCITONIN  PRO BRAIN NATRIURETIC PEPTIDE  PRO BRAIN NATRIURETIC PEPTIDE    EKG: None  Radiology: DG Chest Portable 1 View Result Date: 03/28/2024 CLINICAL DATA:  Pacemaker firing, preceded by chest pain. EXAM: PORTABLE CHEST 1 VIEW COMPARISON:  03/18/2024 FINDINGS: Normal-sized heart. Tortuous and partially calcified thoracic aorta. Mildly improved bilateral patchy density and mildly improved more confluence linear density in the left mid to lower lung zone. Otherwise, clear lungs with normal vascularity. No pleural fluid seen. Stable left subclavian pacer and AICD leads. Stable right neck neural stimulator lead. No acute bony abnormality. IMPRESSION: Mildly improved bilateral patchy atelectasis and/or pneumonia. Electronically Signed   By: Elspeth Bathe M.D.   On: 03/28/2024 17:33     Procedures   Medications Ordered in the ED  acetaminophen  (TYLENOL ) tablet 650 mg (has no administration in time range)    Or  acetaminophen  (TYLENOL ) suppository 650 mg (has no administration in time range)  melatonin tablet 3 mg (has no administration in time range)  ondansetron  (ZOFRAN ) injection 4 mg (has no administration in time range)   amiodarone  (NEXTERONE ) 1.8 mg/mL load via infusion 150 mg (has  no administration in time range)    Followed by  amiodarone  (NEXTERONE  PREMIX) 360-4.14 MG/200ML-% (1.8 mg/mL) IV infusion (has no administration in time range)    Followed by  amiodarone  (NEXTERONE  PREMIX) 360-4.14 MG/200ML-% (1.8 mg/mL) IV infusion (has no administration in time range)                                    Medical Decision Making Amount and/or Complexity of Data Reviewed Labs: ordered. Radiology: ordered.  Risk Decision regarding hospitalization.   83 year old male with history of hypertension, COPD, chronic hypoxic respiratory failure on 3 L nasal cannula, CAD, HFrEF, V. tach with ICD placement presents for evaluation of chest pain.   Twelve-lead EKG demonstrates no ST segment elevation or depressions.  Some T wave inversions.  Initial troponin 26.  Repeat troponin pending.  Chest x-ray demonstrates mildly improved bilateral patchy atelectasis and/or pneumonia.  Patient denies any cough or fevers from recent pneumonia diagnosis.  Antibiotics are complete.  No leukocytosis or signs or symptoms of sepsis.  Pacemaker interrogation demonstrates 8 episodes of V. tach today with no therapy administered, 2 episodes of V. tach with ATP x 2 and x 8 with 31 J administered once.  I spoke with our on-call cardiology group who agrees to be consult on the patient and recommend we bring him in for further monitoring.  Requesting hospitalist admission.   Patient also presenting to emergency department for SNF placement.  He states since being discharged after his recent pneumonia diagnosis he has had significant generalized weakness and has been unable to get up to feed himself or his wife, or even make a phone call for [employer].  His wife is also wheelchair-bound and has dementia.  Social work team has been consulted and recommends SNF placement.  PT OT eval ordered.  Obviously the patient's weakness could also be secondary  to the runs of V. tach he has been having.  Will recommend reevaluation after improvement of V. tach.  Patient accepted by hospitalist Dr. Marcene.     Final diagnoses:  Chest pain, unspecified type  V-tach [facility])  Failure to thrive in adult  Physical deconditioning    ED Discharge Orders     None          Elnor Bernarda SQUIBB, DO 03/28/24 1958  "

## 2024-03-28 NOTE — H&P (Signed)
 " History and Physical      Steven Guzman FMW:[id number] DOB: May 27, 1940 DOA: 03/28/2024; DOS: 03/28/2024  PCP: Steven Guzman Steven Guzman *** Patient coming from: home ***  I have personally briefly reviewed patient's old medical records in [facility]  Chief Complaint: ***  HPI: Steven Guzman is a 83 y.o. male with medical history significant for *** who is admitted to [facility] on 03/28/2024 with *** after presenting from home*** to [name] ED complaining of ***.    ***       ***   ED Course:  Vital signs in the ED were notable for the following: ***  Labs were notable for the following: ***  Per my interpretation, EKG in ED demonstrated the following:  ***  Imaging in the ED, per corresponding formal radiology read, was notable for the following:  ***  EDP discussed patient's case with on-call cardiology, Dr. Floretta, who is formally consulting and evaluating the patient this evening, with additional recommendations pending at this time.    While in the ED, the following were administered: ***  Subsequently, the patient was admitted  ***  ***red    Review of Systems: As per HPI otherwise 10 point review of systems negative.   Past Medical History:  Diagnosis Date   AICD (automatic cardioverter/defibrillator) present    [facility] [employer] MOMENTUM EL ICD D121/ [phone]   Aneurysm    a. Aneurysmal infrarenal aorta up to 33 mm on CT 10/2014, recommended f/u due 10/2017   Anginal pain    Anxiety    Arthritis    right foot   Basal cell carcinoma of nose    S/P MOHS   Biliary acute pancreatitis    CAD (coronary artery disease)    a. s/p MI in 1994 with PCI to LAD at that time b. cath 10/2012 demonstrated EF 30%, inferior akinesis with mild hypokinesis of all walls, patent LAD and RCA stents; ostial PDA with 80-90% obstruction with medical therapy recommended    Chronic systolic CHF (congestive heart failure) (HCC)    EF 30 to 35 % as of 09/2014.     CKD (chronic kidney disease), stage III (HCC)    Complication of anesthesia 10/2014   had to have defibrillator w/ERCP- CODED after having gallstones removed   COPD (chronic obstructive pulmonary disease) (HCC)    a. followed by pulmonary, COPD GOLD stage II   Depression    Diverticulosis of colon 07/2014   noted on CT   Dyspnea    with exertion   GERD (gastroesophageal reflux disease)    Hiatal hernia    large   History of kidney stones    passed stone   Hyperglycemia 10/2012   Hyperlipidemia    Hypertension    Myocardial infarction [facility]) 1994; 2011   Pneumonia 1946; 2015   Prostate enlargement 07/2014   observed on CT   Tobacco abuse    Ventricular tachycardia (HCC)    a. 08/2009 s/p BSX E110 Teligen 100 AICD, ser#: [id number];  b. 08/2008 VT req ATP - detection reprogrammed from 160 to 150. c. EPS and VT ablation by Dr. Waddell 12/21/2014    Past Surgical History:  Procedure Laterality Date   BIOPSY  12/21/2017   Procedure: BIOPSY;  Surgeon: Abran Norleen SAILOR, MD;  Location: [facility] ENDOSCOPY;  Service: Endoscopy;;   CATARACT EXTRACTION W/ INTRAOCULAR LENS  IMPLANT, BILATERAL Bilateral ~ 2011   COLONOSCOPY     COLONOSCOPY WITH PROPOFOL  N/A 12/21/2017  Procedure: COLONOSCOPY WITH PROPOFOL ;  Surgeon: Abran Norleen SAILOR, MD;  Location: [facility] ENDOSCOPY;  Service: Endoscopy;  Laterality: N/A;   ELECTROPHYSIOLOGIC STUDY N/A 12/21/2014   Procedure: V Tach Ablation;  Surgeon: Steven Guzman Birmingham, MD;  Location: [facility] INVASIVE CV LAB;  Service: Cardiovascular;  Laterality: N/A;   ERCP N/A 11/16/2014   Procedure: ENDOSCOPIC RETROGRADE CHOLANGIOPANCREATOGRAPHY (ERCP);  Surgeon: Steven JONETTA Aho, MD;  Location: [facility] ENDOSCOPY;  Service: Endoscopy;  Laterality: N/A;   ESOPHAGOGASTRODUODENOSCOPY (EGD) WITH PROPOFOL  N/A 12/21/2017   Procedure: ESOPHAGOGASTRODUODENOSCOPY (EGD) WITH PROPOFOL ;  Surgeon: Abran Norleen SAILOR, MD;  Location: [facility] ENDOSCOPY;  Service: Endoscopy;  Laterality: N/A;   EYE SURGERY     FOOT SURGERY Left 2005    fixed bone that stuck out in my ankle area   HEMORRHOID BANDING     ICD GENERATOR CHANGEOUT N/A 04/20/2022   Procedure: ICD GENERATOR CHANGEOUT;  Surgeon: Guzman Steven LELON, MD;  Location: [facility] INVASIVE CV LAB;  Service: Cardiovascular;  Laterality: N/A;   IMPLANTABLE CARDIOVERTER DEFIBRILLATOR IMPLANT  09/06/09   BSX dual chamber ICD implanted in [place]  for cardiac arrest 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   INGUINAL HERNIA REPAIR Right ~ 1995   INGUINAL HERNIA REPAIR Left 04/10/2022   Procedure: HERNIA REPAIR INGUINAL ADULT;  Surgeon: Steven Cough, MD;  Location: [facility] OR;  Service: General;  Laterality: Left;   LEFT HEART CATH AND CORONARY ANGIOGRAPHY N/A 03/19/2022   Procedure: LEFT HEART CATH AND CORONARY ANGIOGRAPHY;  Surgeon: Jordan, Peter M, MD;  Location: [facility] INVASIVE CV LAB;  Service: Cardiovascular;  Laterality: N/A;   LEFT HEART CATHETERIZATION WITH CORONARY ANGIOGRAM N/A 11/25/2012   demonstrated EF 30%, inferior akinesis with mild hypokinesis of all walls, patent LAD and RCA stents; ostial PDA with 80-90% obstruction with medical therapy recommended   MOHS SURGERY  2008   nose, skin graft   POLYPECTOMY  12/21/2017   Procedure: POLYPECTOMY;  Surgeon: Abran Norleen SAILOR, MD;  Location: [facility] ENDOSCOPY;  Service: Endoscopy;;   RETINAL DETACHMENT SURGERY Right 2013   TENOLYSIS Right 12/21/2013   Procedure: TENOLYSIS FLEXOR CARPI RADIALIS ,DEBRIDEMENT RIGHT JOINT WRIST,DEBRIDEMENT SCAPHOTRAPEZIAL TRAPEZOID, REPAIR OF EXTENSOR HOOD;  Surgeon: Arley Curia, MD;  Location: [facility];  Service: Orthopedics;  Laterality: Right;   TOE SURGERY Right 09/2019   3rd toe/hammer toe   V-TACH ABLATION  12/21/2014   VIDEO BRONCHOSCOPY Bilateral 01/09/2016   Procedure: VIDEO BRONCHOSCOPY WITHOUT FLUORO;  Surgeon: Vicenta KATHEE Lennert, MD;  Location: [facility] ENDOSCOPY;  Service: Cardiopulmonary;  Laterality: Bilateral;    Social History:  reports that he has been smoking cigarettes. He has a 55 pack-year  smoking history. He has never used smokeless tobacco. He reports current alcohol  use. He reports that he does not use drugs.   Allergies[1]  Family History  Problem Relation Age of Onset   Heart attack Brother    CAD Father    Hypertension Father    CAD Mother    Hypertension Mother    Hypertension Brother    Stroke Neg Hx     Family history reviewed and not pertinent ***   Prior to Admission medications  Medication Sig Start Date End Date Taking? Authorizing Provider  acetaminophen  (TYLENOL ) 500 MG tablet Take 1,000 mg by mouth every 6 (six) hours as needed for moderate pain.    [provider]  Lactobacillus (ACIDOPHILUS) CAPS capsule Take 1 capsule by mouth 2 (two) times daily. 03/24/24 04/23/24  Christobal Guadalajara, MD  albuterol  (PROVENTIL ) (2.5 MG/3ML) 0.083% nebulizer solution  USE 1 VIAL VIA NEBULIZER EVERY 6 HOURS AS NEEDED FOR WHEEZING OR SHORTNESS OF BREATH 07/18/19   Olalere, Jennet A, MD  amiodarone  (PACERONE ) 200 MG tablet Take 1 tablet (200 mg total) by mouth daily. \ 08/27/23   Lesia Ozell Barter, PA-C  Artificial Tear Solution (SOOTHE XP OP) Place 2 drops into both eyes daily as needed (dry eyes).    [provider]  Ascorbic Acid (VITAMIN C) 1000 MG tablet Take 1,000 mg by mouth daily.    [provider]  aspirin  EC 81 MG tablet Take 1 tablet (81 mg total) by mouth daily. Swallow whole. 03/21/22   Meng, Hao, PA  Biotin  5000 MCG CAPS Take 5,000 mcg by mouth daily.    [provider]  Calcium  Citrate-Vitamin D  (CALCIUM  + D PO) Take 1 tablet by mouth daily.    [provider]  carvedilol  (COREG ) 3.125 MG tablet Take 1 tablet (3.125 mg total) by mouth 2 (two) times daily. 04/26/23   Rana Lum CROME, NP  cetirizine (ZYRTEC) 10 MG tablet Take 10 mg by mouth daily.    [provider]  Cholecalciferol  50 MCG (2000 UT) TABS Take 2,000 Units by mouth daily at 6 (six) AM. 09/12/18   [provider]  Cyanocobalamin  (B12  LIQUID HEALTH BOOSTER PO) Take 5,000 mcg by mouth daily.    [provider]  Docusate Sodium  (DSS) 100 MG CAPS Take 2 capsules by mouth daily in the afternoon. 02/10/22   [provider]  ELIQUIS  5 MG TABS tablet TAKE 1 TABLET BY MOUTH TWICE A DAY 01/11/24   Croitoru, Mihai, MD  empagliflozin  (JARDIANCE ) 10 MG TABS tablet Take 1 tablet (10 mg total) by mouth daily. 04/19/23   Croitoru, Mihai, MD  escitalopram  (LEXAPRO ) 20 MG tablet Take 20 mg by mouth daily. 11/03/23   [provider]  Evolocumab  (REPATHA  SURECLICK) 140 MG/ML SOAJ INJECT 1 ML INTO THE SKIN EVERY 14 DAYS 01/10/24   Croitoru, Mihai, MD  feeding supplement (ENSURE PLUS HIGH PROTEIN) LIQD Take 237 mLs by mouth 2 (two) times daily between meals for 7 days. 03/24/24 03/31/24  Christobal Guadalajara, MD  fluticasone  (FLONASE ) 50 MCG/ACT nasal spray Place 2 sprays into both nostrils daily. Patient taking differently: Place 2 sprays into both nostrils as needed for allergies. 02/13/21   Fargo, Amy E, NP  furosemide  (LASIX ) 40 MG tablet Take 1 tablet (40 mg total) by mouth daily. 03/05/24 06/03/24  Fairy Frames, MD  Guaifenesin  1200 MG TB12 Take 1,200 mg by mouth daily.    [provider]  Ipratropium-Albuterol  (COMBIVENT  RESPIMAT) 20-100 MCG/ACT AERS respimat Inhale 1 puff into the lungs every 6 (six) hours. Shortness of breath or wheezing Patient taking differently: Inhale 1 puff into the lungs every 6 (six) hours as needed for wheezing or shortness of breath. Shortness of breath or wheezing 09/18/21   Fargo, Amy E, NP  IRON PO Take 1 tablet by mouth daily.    [provider]  lidocaine  (LIDODERM ) 5 % Place 1 patch onto the skin daily. Remove & Discard patch within 12 hours or as directed by MD 03/25/24   Christobal Guadalajara, MD  Magnesium  200 MG TABS Take 400 mg by mouth daily.    [provider]  melatonin 3 MG TABS tablet Take 3 mg by mouth at bedtime.    [provider]  mexiletine (MEXITIL ) 200 MG  capsule TAKE 1 CAPSULE BY MOUTH 2 TIMES A DAY 02/17/24   Rana Lum CROME, NP  Multiple Vitamins-Minerals (CENTRUM ADULTS PO) Take 1 tablet by mouth daily.    [provider]  mupirocin  ointment (BACTROBAN ) 2 % Apply 1 Application topically daily as needed (wound care). 11/04/22   [provider]  nitroGLYCERIN  (NITROSTAT ) 0.4 MG SL tablet Place 1 tablet (0.4 mg total) under the tongue every 5 (five) minutes as needed for chest pain. DISSOLVE 1 TABLET UNDER THE TONGUE EVERY 5 MINUTES FOR 3 DOSES 12/15/23   Court Dorn PARAS, MD  omeprazole (PRILOSEC) 20 MG capsule Take 20 mg by mouth daily.     [provider]  potassium chloride  20 MEQ TBCR Take 1 tablet (20 mEq total) by mouth daily. 03/05/24 06/03/24  Fairy Frames, MD  predniSONE  (DELTASONE ) 10 MG tablet Take 2 tablets (20 mg total) by mouth daily with breakfast for 2 days, THEN 1 tablet (10 mg total) daily with breakfast for 2 days. 03/25/24 03/29/24  Christobal Guadalajara, MD  pregabalin  (LYRICA ) 25 MG capsule Take by mouth in the morning and at bedtime. 03/08/23   [provider]  Respiratory Therapy Supplies (FLUTTER) DEVI Use as directed 03/16/17   Alaine Vicenta NOVAK, MD  Spacer/Aero-Holding Chambers (OPTICHAMBER DIAMOND ) MISC optichamber [facility] 09/12/19   Olalere, Adewale A, MD  STIOLTO RESPIMAT 2.5-2.5 MCG/ACT AERS Inhale 2 puffs into the lungs daily. 09/22/21   [provider]  tamsulosin  (FLOMAX ) 0.4 MG CAPS capsule TAKE ONE CAPSULE BY MOUTH DAILY AFTER SUPPER 06/30/21   Gil Greig BRAVO, NP     Objective    Physical Exam: Vitals:   03/28/24 1540 03/28/24 1830  BP: 125/70 110/64  Pulse: 97 69  Resp: 13 (!) 31  Temp: 97.7 F (36.5 C)   TempSrc: Oral   SpO2: 91% (!) 86%    General: appears to be stated age; alert, oriented Skin: warm, dry, no rash Head:  AT/[place] Mouth:  Oral mucosa membranes appear moist, normal dentition Neck: supple; trachea midline Heart:  RRR; did not appreciate any Guzman/R/G Lungs:  CTAB, did not appreciate any wheezes, rales, or rhonchi Abdomen: + BS; soft, ND, NT Vascular: 2+ pedal pulses b/l; 2+ radial pulses b/l Extremities: no peripheral edema, no muscle wasting   ***   *** Neuro: strength and sensation intact in upper and lower extremities b/l  *** Neuro: 5/5 strength of the proximal and distal flexors and extensors of the upper and lower extremities bilaterally; sensation intact in upper and lower extremities b/l; cranial nerves II through XII grossly intact; no pronator drift; no evidence suggestive of slurred speech, dysarthria, or facial droop; Normal muscle tone. No tremors. *** Neuro: In the setting of the patient's current mental status and associated inability to follow instructions, unable to perform full neurologic exam at this time.  As such, assessment of strength, sensation, and cranial nerves is limited at this time. Patient noted to spontaneously move all 4 extremities. No tremors.  ***        Labs on Admission: I have personally reviewed following labs and imaging studies  CBC: Recent Labs  Lab 03/23/24 0234 03/28/24 1553  WBC 7.3 4.8  NEUTROABS  --  3.5  HGB 10.7* 12.5*  HCT 34.6* 40.8  MCV 95.8 98.3  PLT 239 175   Basic Metabolic Panel: Recent Labs  Lab 03/23/24 0234 03/28/24 1553  NA 132* 132*  K 4.2 4.1  CL 92* 91*  CO2 35* 30  GLUCOSE 108* 99  BUN 15 16  CREATININE 0.52* 0.69  CALCIUM  8.3* 8.8*   GFR: Estimated Creatinine  Clearance: 62.5 mL/min (by C-G formula based on SCr of 0.69 mg/dL). Liver Function Tests: No results for input(s): AST, ALT, ALKPHOS, BILITOT, PROT, ALBUMIN in the last 168 hours. No results for input(s): LIPASE, AMYLASE in the last 168 hours. No results for input(s): AMMONIA in the last 168 hours. Coagulation Profile: No results for input(s): INR, PROTIME in the last 168 hours. Cardiac Enzymes: No results for input(s): CKTOTAL, CKMB, CKMBINDEX, TROPONINI in the  last 168 hours. BNP (last 3 results) No results for input(s): PROBNP in the last 8760 hours. HbA1C: No results for input(s): HGBA1C in the last 72 hours. CBG: Recent Labs  Lab 03/23/24 1200 03/23/24 1725 03/23/24 2029 03/24/24 0800 03/24/24 1233  GLUCAP 145* 98 138* 101* 149*   Lipid Profile: No results for input(s): CHOL, HDL, LDLCALC, TRIG, CHOLHDL, LDLDIRECT in the last 72 hours. Thyroid  Function Tests: No results for input(s): TSH, T4TOTAL, FREET4, T3FREE, THYROIDAB in the last 72 hours. Anemia Panel: No results for input(s): VITAMINB12, FOLATE, FERRITIN, TIBC, IRON, RETICCTPCT in the last 72 hours. Urine analysis:    Component Value Date/Time   COLORURINE YELLOW 03/03/2024 1415   APPEARANCEUR CLEAR 03/03/2024 1415   LABSPEC 1.015 03/03/2024 1415   PHURINE 7.0 03/03/2024 1415   GLUCOSEU >=500 (A) 03/03/2024 1415   GLUCOSEU NEGATIVE 05/24/2015 1007   HGBUR NEGATIVE 03/03/2024 1415   BILIRUBINUR NEGATIVE 03/03/2024 1415   BILIRUBINUR Negative 12/11/2021 1529   KETONESUR NEGATIVE 03/03/2024 1415   PROTEINUR NEGATIVE 03/03/2024 1415   UROBILINOGEN 0.2 12/11/2021 1529   UROBILINOGEN 1.0 05/24/2015 1007   NITRITE NEGATIVE 03/03/2024 1415   LEUKOCYTESUR NEGATIVE 03/03/2024 1415    Radiological Exams on Admission: DG Chest Portable 1 View Result Date: 03/28/2024 CLINICAL DATA:  Pacemaker firing, preceded by chest pain. EXAM: PORTABLE CHEST 1 VIEW COMPARISON:  03/18/2024 FINDINGS: Normal-sized heart. Tortuous and partially calcified thoracic aorta. Mildly improved bilateral patchy density and mildly improved more confluence linear density in the left mid to lower lung zone. Otherwise, clear lungs with normal vascularity. No pleural fluid seen. Stable left subclavian pacer and AICD leads. Stable right neck neural stimulator lead. No acute bony abnormality. IMPRESSION: Mildly improved bilateral patchy atelectasis and/or pneumonia.  Electronically Signed   By: Elspeth Bathe Guzman.D.   On: 03/28/2024 17:33      Assessment/Plan   Principal Problem:   Paroxysmal ventricular tachycardia (HCC)   ***            ***                     ***                      ***                     ***                     ***                     ***                      ***                     ***                     ***                     ***                     ***                    ***                   ***  DVT prophylaxis: SCD's ***  Code Status: Full code*** Family Communication: none*** Disposition Plan: Per Rounding Team Consults called: EDP discussed patient's case with on-call cardiology, Dr. Floretta, who is formally consulting and evaluating the patient this evening, with additional recommendations pending at this time.  ;  Admission status: ***     I SPENT GREATER THAN 75 *** MINUTES IN CLINICAL CARE TIME/MEDICAL DECISION-MAKING IN COMPLETING THIS ADMISSION.      Eva NOVAK Antwann Preziosi DO [facility]  From 7PM - 7AM   03/28/2024, 7:35 PM   ***      [1]  Allergies Allergen Reactions   Sulfa Antibiotics Hives   Cephalexin  Itching   "

## 2024-03-28 NOTE — Consult Note (Signed)
 "  Cardiology Consultation   Patient ID: Steven Guzman MRN: [id number]; DOB: [birth year]  Admit date: 03/28/2024 Date of Consult: 03/28/2024  PCP:  Esmeralda Morton SAUNDERS, PA-C   [facility] Providers Cardiologist:  Jerel Balding, MD  Cardiology APP:  Lesia Ozell Barter, PA-C  Electrophysiologist:  Danelle Birmingham, MD       Patient Profile: Steven Guzman is a 83 y.o. male with a hx of CAD c/b cardiac arrest (1995) s/p multiple PCIs (most recent LAD in 2011), NSTEMI (03/18/22), ICM (EF = 30%), VT s/p Bsci DC ICD (2010) s/p VT ablation (2016), Barostim (9/24), PAF (on Eliquis ), HTN, HLD, COPD who is being seen 03/28/2024 for the evaluation of VT and ICD shock at the request of Dr. Elnor.  History of Present Illness: Steven Guzman has had multiple recent hospitalizations.  The first was in early December where he was hospitalized for an acute heart failure exacerbation and hypoxic respiratory failure.  He was diuresed and ultimately recommended discharge to a SNF but the patient preferred to go home due to the need to care for his ailing wife.  He was subsequently readmitted 12/14-12/16 for presumed multifocal pneumonia.  He was treated with a course of antibiotics and cultures demonstrated colonization of Pseudomonas and Klebsiella.  He was also found to have a rounded ill-defined masslike consolidative opacity in the left upper lobe concerning for possible malignancy versus pneumonia as well.  Most recent TTE on 12/6 showed stable HFrEF.  He was ultimately discharged after completing his antibiotic course within the hospitalization.  Yesterday, the patient reports that he was becoming extremely stressed trying to order delivery food for him and his wife and was unsuccessful.  He tried again earlier this morning and had additional stress.  Earlier this afternoon the patient developed some very mild left-sided chest discomfort and ultimately suffered an ICD shock x1 at 3 PM.  This prompted  him to call EMS and was brought into the ED for evaluation.  Outside of these recent hospitalizations he denies having any further changes.  He endorses good medication adherence and denies dietary indiscretion.  Patient has a longstanding history of VTE and has been electrically quiescent for almost a decade with amiodarone  and mexiletine.  He underwent a VT ablation in 2016 and has not had issues since until now.  In the ED his VS were afebrile, BP 125/70, HR 97, RR 13 and satting 91% on 3 L O2.  Labs notable for sodium 132, mildly elevated troponin at 26, normal K and mag.  CXR demonstrated mildly improved bilateral patchy atelectasis/PNA.  EKG was nonischemic.  His ICD was interrogated which demonstrated that he has had multiple runs of NSVT and VT dating back to July 13, 2022, but most recently December 14.  On 03/28/2024 he had ATP x 2 for VT at 2:52 PM.  At 3:08 PM he had ATP x 8 before undergoing 1 shock for 10 minutes of VT.  Prior to this episode the longest run of VT had was 7 minutes on 12/14 that was terminated by ATP x 8.  Cardiology is consulted for evaluation.   Past Medical History:  Diagnosis Date   AICD (automatic cardioverter/defibrillator) present    [facility] [employer] MOMENTUM EL ICD D121/ [phone]   Aneurysm    a. Aneurysmal infrarenal aorta up to 33 mm on CT 10/2014, recommended f/u due 10/2017   Anginal pain    Anxiety    Arthritis    right foot  Basal cell carcinoma of nose    S/P MOHS   Biliary acute pancreatitis    CAD (coronary artery disease)    a. s/p MI in 1994 with PCI to LAD at that time b. cath 10/2012 demonstrated EF 30%, inferior akinesis with mild hypokinesis of all walls, patent LAD and RCA stents; ostial PDA with 80-90% obstruction with medical therapy recommended    Chronic systolic CHF (congestive heart failure) (HCC)    EF 30 to 35 % as of 09/2014.    CKD (chronic kidney disease), stage III (HCC)    Complication of anesthesia 10/2014   had to have  defibrillator w/ERCP- CODED after having gallstones removed   COPD (chronic obstructive pulmonary disease) (HCC)    a. followed by pulmonary, COPD GOLD stage II   Depression    Diverticulosis of colon 07/2014   noted on CT   Dyspnea    with exertion   GERD (gastroesophageal reflux disease)    Hiatal hernia    large   History of kidney stones    passed stone   Hyperglycemia 10/2012   Hyperlipidemia    Hypertension    Myocardial infarction [facility]) 1994; 2011   Pneumonia 1946; 2015   Prostate enlargement 07/2014   observed on CT   Tobacco abuse    Ventricular tachycardia (HCC)    a. 08/2009 s/p BSX E110 Teligen 100 AICD, ser#: [id number];  b. 08/2008 VT req ATP - detection reprogrammed from 160 to 150. c. EPS and VT ablation by Dr. Waddell 12/21/2014    Past Surgical History:  Procedure Laterality Date   BIOPSY  12/21/2017   Procedure: BIOPSY;  Surgeon: Abran Norleen SAILOR, MD;  Location: [facility] ENDOS[facility];  Service: Endoscopy;;   CATARACT EXTRACTION W/ INTRAOCULAR LENS  IMPLANT, BILATERAL Bilateral ~ 2011   COLONOSCOPY     COLONOSCOPY WITH PROPOFOL  N/A 12/21/2017   Procedure: COLONOSCOPY WITH PROPOFOL ;  Surgeon: Abran Norleen SAILOR, MD;  Location: [facility];  Service: Endoscopy;  Laterality: N/A;   ELECTROPHYSIOLOGIC STUDY N/A 12/21/2014   Procedure: V Tach Ablation;  Surgeon: Danelle LELON Waddell, MD;  Location: [facility] INVASIVE CV LAB;  Service: Cardiovascular;  Laterality: N/A;   ERCP N/A 11/16/2014   Procedure: ENDOSCOPIC RETROGRADE CHOLANGIOPANCREATOGRAPHY (ERCP);  Surgeon: Lamar JONETTA Aho, MD;  Location: [facility] ENDOSCOPY;  Service: Endoscopy;  Laterality: N/A;   ESOPHAGOGASTRODUODENOSCOPY (EGD) WITH PROPOFOL  N/A 12/21/2017   Procedure: ESOPHAGOGASTRODUODENOSCOPY (EGD) WITH PROPOFOL ;  Surgeon: Abran Norleen SAILOR, MD;  Location: [facility];  Service: Endoscopy;  Laterality: N/A;   EYE SURGERY     FOOT SURGERY Left 2005   fixed bone that stuck out in my ankle area   HEMORRHOID BANDING     ICD GENERATOR CHANGEOUT N/A  04/20/2022   Procedure: ICD GENERATOR CHANGEOUT;  Surgeon: Waddell Danelle LELON, MD;  Location: [facility] INVASIVE CV LAB;  Service: Cardiovascular;  Laterality: N/A;   IMPLANTABLE CARDIOVERTER DEFIBRILLATOR IMPLANT  09/06/09   BSX dual chamber ICD implanted in [place]  for cardiac arrest 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   INGUINAL HERNIA REPAIR Right ~ 1995   INGUINAL HERNIA REPAIR Left 04/10/2022   Procedure: HERNIA REPAIR INGUINAL ADULT;  Surgeon: Ebbie Cough, MD;  Location: [facility] OR;  Service: General;  Laterality: Left;   LEFT HEART CATH AND CORONARY ANGIOGRAPHY N/A 03/19/2022   Procedure: LEFT HEART CATH AND CORONARY ANGIOGRAPHY;  Surgeon: Jordan, Peter M, MD;  Location: [facility] INVASIVE CV LAB;  Service: Cardiovascular;  Laterality: N/A;   LEFT HEART CATHETERIZATION WITH CORONARY ANGIOGRAM N/A  11/25/2012   demonstrated EF 30%, inferior akinesis with mild hypokinesis of all walls, patent LAD and RCA stents; ostial PDA with 80-90% obstruction with medical therapy recommended   MOHS SURGERY  2008   nose, skin graft   POLYPECTOMY  12/21/2017   Procedure: POLYPECTOMY;  Surgeon: Abran Norleen SAILOR, MD;  Location: [facility];  Service: Endoscopy;;   RETINAL DETACHMENT SURGERY Right 2013   TENOLYSIS Right 12/21/2013   Procedure: TENOLYSIS FLEXOR CARPI RADIALIS ,DEBRIDEMENT RIGHT JOINT WRIST,DEBRIDEMENT SCAPHOTRAPEZIAL TRAPEZOID, REPAIR OF EXTENSOR HOOD;  Surgeon: Arley Curia, MD;  Location: [facility];  Service: Orthopedics;  Laterality: Right;   TOE SURGERY Right 09/2019   3rd toe/hammer toe   V-TACH ABLATION  12/21/2014   VIDEO BRONCHOSCOPY Bilateral 01/09/2016   Procedure: VIDEO BRONCHOSCOPY WITHOUT FLUORO;  Surgeon: Vicenta KATHEE Lennert, MD;  Location: [facility] END[facility];  Service: Cardiopulmonary;  Laterality: Bilateral;     Home Medications:  Prior to Admission medications  Medication Sig Start Date End Date Taking? Authorizing Provider  acetaminophen  (TYLENOL ) 500 MG tablet Take 1,000 mg by mouth every 6  (six) hours as needed for moderate pain.   Yes [provider]  albuterol  (PROVENTIL ) (2.5 MG/3ML) 0.083% nebulizer solution USE 1 VIAL VIA NEBULIZER EVERY 6 HOURS AS NEEDED FOR WHEEZING OR SHORTNESS OF BREATH 07/18/19  Yes Olalere, Adewale A, MD  amiodarone  (PACERONE ) 200 MG tablet Take 1 tablet (200 mg total) by mouth daily. \ 08/27/23  Yes Lesia Ozell Barter, PA-C  Artificial Tear Solution (SOOTHE XP OP) Place 2 drops into both eyes daily as needed (dry eyes).   Yes [provider]  Ascorbic Acid (VITAMIN C) 1000 MG tablet Take 1,000 mg by mouth daily.   Yes [provider]  aspirin  EC 81 MG tablet Take 1 tablet (81 mg total) by mouth daily. Swallow whole. 03/21/22  Yes Meng, Hao, PA  Biotin  5000 MCG CAPS Take 5,000 mcg by mouth daily.   Yes [provider]  Calcium  Citrate-Vitamin D  (CALCIUM  + D PO) Take 1 tablet by mouth daily.   Yes [provider]  carvedilol  (COREG ) 3.125 MG tablet Take 1 tablet (3.125 mg total) by mouth 2 (two) times daily. 04/26/23  Yes Fountain, Madison L, NP  cetirizine (ZYRTEC) 10 MG tablet Take 10 mg by mouth daily.   Yes [provider]  Cholecalciferol  50 MCG (2000 UT) TABS Take 2,000 Units by mouth daily at 6 (six) AM. 09/12/18  Yes [provider]  Cyanocobalamin  (B12 LIQUID HEALTH BOOSTER PO) Take 5,000 mcg by mouth daily.   Yes [provider]  Docusate Sodium  (DSS) 100 MG CAPS Take 2 capsules by mouth daily in the afternoon. 02/10/22  Yes [provider]  ELIQUIS  5 MG TABS tablet TAKE 1 TABLET BY MOUTH TWICE A DAY 01/11/24  Yes Croitoru, Mihai, MD  empagliflozin  (JARDIANCE ) 10 MG TABS tablet Take 1 tablet (10 mg total) by mouth daily. 04/19/23  Yes Croitoru, Mihai, MD  escitalopram  (LEXAPRO ) 20 MG tablet Take 20 mg by mouth daily. 11/03/23  Yes [provider]  Evolocumab  (REPATHA  SURECLICK) 140 MG/ML SOAJ INJECT 1 ML INTO THE SKIN EVERY 14 DAYS 01/10/24  Yes Croitoru, Mihai,  MD  feeding supplement (ENSURE PLUS HIGH PROTEIN) LIQD Take 237 mLs by mouth 2 (two) times daily between meals for 7 days. 03/24/24 03/31/24 Yes Christobal Guadalajara, MD  fluticasone  (FLONASE ) 50 MCG/ACT nasal spray Place 2 sprays into both nostrils daily. Patient taking differently: Place 2 sprays into both nostrils  as needed for allergies. 02/13/21  Yes Fargo, Amy E, NP  furosemide  (LASIX ) 40 MG tablet Take 1 tablet (40 mg total) by mouth daily. 03/05/24 06/03/24 Yes Fairy Frames, MD  Guaifenesin  1200 MG TB12 Take 1,200 mg by mouth daily.   Yes [provider]  Ipratropium-Albuterol  (COMBIVENT  RESPIMAT) 20-100 MCG/ACT AERS respimat Inhale 1 puff into the lungs every 6 (six) hours. Shortness of breath or wheezing Patient taking differently: Inhale 1 puff into the lungs every 6 (six) hours as needed for wheezing or shortness of breath. Shortness of breath or wheezing 09/18/21  Yes Fargo, Amy E, NP  IRON PO Take 1 tablet by mouth daily.   Yes [provider]  Lactobacillus (ACIDOPHILUS) CAPS capsule Take 1 capsule by mouth 2 (two) times daily. 03/24/24 04/23/24 Yes Christobal Guadalajara, MD  lidocaine  (LIDODERM ) 5 % Place 1 patch onto the skin daily. Remove & Discard patch within 12 hours or as directed by MD 03/25/24  Yes Christobal Guadalajara, MD  Magnesium  200 MG TABS Take 400 mg by mouth daily.   Yes [provider]  melatonin 3 MG TABS tablet Take 3 mg by mouth at bedtime.   Yes [provider]  Multiple Vitamins-Minerals (CENTRUM ADULTS PO) Take 1 tablet by mouth daily.   Yes [provider]  mupirocin  ointment (BACTROBAN ) 2 % Apply 1 Application topically daily as needed (wound care). 11/04/22  Yes [provider]  nitroGLYCERIN  (NITROSTAT ) 0.4 MG SL tablet Place 1 tablet (0.4 mg total) under the tongue every 5 (five) minutes as needed for chest pain. DISSOLVE 1 TABLET UNDER THE TONGUE EVERY 5 MINUTES FOR 3 DOSES 12/15/23  Yes Court Dorn PARAS, MD  omeprazole (PRILOSEC) 20 MG  capsule Take 20 mg by mouth daily.    Yes [provider]  potassium chloride  20 MEQ TBCR Take 1 tablet (20 mEq total) by mouth daily. 03/05/24 06/03/24 Yes Fairy Frames, MD  predniSONE  (DELTASONE ) 10 MG tablet Take 2 tablets (20 mg total) by mouth daily with breakfast for 2 days, THEN 1 tablet (10 mg total) daily with breakfast for 2 days. 03/25/24 03/29/24 Yes Christobal Guadalajara, MD  pregabalin  (LYRICA ) 25 MG capsule Take by mouth in the morning and at bedtime. 03/08/23  Yes [provider]  STIOLTO RESPIMAT 2.5-2.5 MCG/ACT AERS Inhale 2 puffs into the lungs daily. 09/22/21  Yes [provider]  tamsulosin  (FLOMAX ) 0.4 MG CAPS capsule TAKE ONE CAPSULE BY MOUTH DAILY AFTER SUPPER 06/30/21  Yes Fargo, Amy E, NP  mexiletine (MEXITIL ) 200 MG capsule TAKE 1 CAPSULE BY MOUTH 2 TIMES A DAY 02/17/24   Rana Lum CROME, NP  Respiratory Therapy Supplies (FLUTTER) DEVI Use as directed 03/16/17   Alaine Vicenta NOVAK, MD  Spacer/Aero-Holding Chambers (OPTICHAMBER DIAMOND ) MISC optichamber [facility] 09/12/19   Olalere, Jennet LABOR, MD    Scheduled Meds:  Continuous Infusions:  PRN Meds:   Allergies:   Allergies[1]  Social History:   Social History   Socioeconomic History   Marital status: Married    Spouse name: Not on file   Number of children: Not on file   Years of education: Not on file   Highest education level: Not on file  Occupational History   Occupation: Retired  Tobacco Use   Smoking status: Every Day    Current packs/day: 1.00    Average packs/day: 1 pack/day for 55.0 years (55.0 ttl pk-yrs)    Types: Cigarettes   Smokeless tobacco: Never   Tobacco comments:  off/on, always ready to quit but does not work out - still occasional smokes  Advertising Account Planner   Vaping status: Never Used  Substance and Sexual Activity   Alcohol  use: Yes    Comment: occasional   Drug use: No   Sexual activity: Not Currently  Other Topics Concern   Not on file  Social History Narrative    Not on file   Social Drivers of Health   Tobacco Use: High Risk (03/12/2024)   Patient History    Smoking Tobacco Use: Every Day    Smokeless Tobacco Use: Never    Passive Exposure: Not on file  Financial Resource Strain: Medium Risk (03/06/2021)   Overall Financial Resource Strain (CARDIA)    Difficulty of Paying Living Expenses: Somewhat hard  Food Insecurity: No Food Insecurity (03/13/2024)   Epic    Worried About Programme Researcher, Broadcasting/film/video in the Last Year: Never true    Ran Out of Food in the Last Year: Never true  Transportation Needs: No Transportation Needs (03/27/2024)   Received [facility]   Epic    In the past 12 months, has lack of transportation kept you from medical appointments or from getting medications?: No    In the past 12 months, has lack of transportation kept you from meetings, work, or from getting things needed for daily living?: No  Physical Activity: Insufficiently Active (03/06/2021)   Exercise Vital Sign    Days of Exercise per Week: 4 days    Minutes of Exercise per Session: 20 min  Stress: No Stress Concern Present (12/27/2021)   Received from [facility], [facility] visits prior to 05/30/2022.   [employer] of Occupational Health - Occupational Stress Questionnaire    Feeling of Stress : Not at all  Social Connections: Moderately Isolated (03/13/2024)   Social Connection and Isolation Panel    Frequency of Communication with Friends and Family: More than three times a week    Frequency of Social Gatherings with Friends and Family: Never    Attends Religious Services: Never    Database Administrator or Organizations: No    Attends Banker Meetings: Never    Marital Status: Married  Catering Manager Violence: Not At Risk (03/26/2024)   Received from [facility]   HITS    Over the last 12 months how often did your partner physically hurt you?: Never    Over the last 12 months how often did your partner  insult you or talk down to you?: Never    Over the last 12 months how often did your partner threaten you with physical harm?: Never    Over the last 12 months how often did your partner scream or curse at you?: Never  Depression (PHQ2-9): Low Risk (03/12/2022)   Depression (PHQ2-9)    PHQ-2 Score: 0  Alcohol  Screen: Low Risk (03/06/2021)   Alcohol  Screen    Last Alcohol  Screening Score (AUDIT): 1  Housing: Low Risk (03/13/2024)   Epic    Unable to Pay for Housing in the Last Year: No    Number of Times Moved in the Last Year: 0    Homeless in the Last Year: No  Utilities: Not At Risk (03/13/2024)   Epic    Threatened with loss of utilities: No  Health Literacy: Not on file    Family History:    Family History  Problem Relation Age of Onset   Heart attack Brother  CAD Father    Hypertension Father    CAD Mother    Hypertension Mother    Hypertension Brother    Stroke Neg Hx      ROS:  Please see the history of present illness.  All other ROS reviewed and negative.     Physical Exam/Data: Vitals:   03/28/24 1540  BP: 125/70  Pulse: 97  Resp: 13  Temp: 97.7 F (36.5 C)  TempSrc: Oral  SpO2: 91%   No intake or output data in the 24 hours ending 03/28/24 1852    03/24/2024    5:00 AM 03/23/2024    5:00 AM 03/22/2024    5:00 AM  Last 3 Weights  Weight (lbs) 144 lb 6.4 oz 144 lb 6.4 oz 143 lb 11.8 oz  Weight (kg) 65.5 kg 65.5 kg 65.2 kg     There is no height or weight on file to calculate BMI.  General: Very thin, malnourished appearing gentleman in NAD HEENT: normal Neck: no JVD Vascular: No carotid bruits; Distal pulses 2+ bilaterally Cardiac:  normal S1, S2; RRR; no murmur, rubs or gallops, Barostim and ICD sites look C/D/I Lungs:  clear to auscultation bilaterally, no wheezing, rhonchi or rales  Abd: soft, nontender, no hepatomegaly  Ext: no edema Musculoskeletal:  No deformities, BUE and BLE strength normal and equal Skin: Diffuse bruising on all  extremities Neuro:  CNs 2-12 intact, no focal abnormalities noted Psych:  Normal affect   EKG:       Telemetry:  Telemetry was personally reviewed and demonstrates: Frequent PVCs  Relevant CV Studies:  TTE 03/04/24:  IMPRESSIONS     1. Left ventricular ejection fraction, by estimation, is 35 to 40%. The  left ventricle has moderately decreased function. Left ventricular  endocardial border not optimally defined to evaluate regional wall motion.  Left ventricular diastolic function  could not be evaluated.   2. Right ventricular systolic function is mildly reduced. The right  ventricular size is normal.   3. Right atrial size was mildly dilated.   4. The mitral valve is normal in structure. Trivial mitral valve  regurgitation. No evidence of mitral stenosis.   5. The aortic valve is calcified. There is severe calcifcation of the  aortic valve. There is severe thickening of the aortic valve. Aortic valve  regurgitation is not visualized. Aortic valve sclerosis/calcification is  present, without any evidence of  aortic stenosis. Aortic valve Vmax measures 1.28 m/s.   6. Recommend limited study with definity  contrast to assess the LV apex  for thrombus and assess focal wall motion.   Laboratory Data: High Sensitivity Troponin:   Recent Labs  Lab 03/03/24 1414 03/03/24 1632 03/12/24 0110 03/12/24 0310  TROPONINIHS 15 11 30* 11    Recent Labs  Lab 03/28/24 1553  TRNPT 26*      Chemistry Recent Labs  Lab 03/23/24 0234 03/28/24 1553  NA 132* 132*  K 4.2 4.1  CL 92* 91*  CO2 35* 30  GLUCOSE 108* 99  BUN 15 16  CREATININE 0.52* 0.69  CALCIUM  8.3* 8.8*  GFRNONAA >60 >60  ANIONGAP 5 10    No results for input(s): PROT, ALBUMIN, AST, ALT, ALKPHOS, BILITOT in the last 168 hours. Lipids No results for input(s): CHOL, TRIG, HDL, LABVLDL, LDLCALC, CHOLHDL in the last 168 hours.  Hematology Recent Labs  Lab 03/23/24 0234 03/28/24 1553   WBC 7.3 4.8  RBC 3.61* 4.15*  HGB 10.7* 12.5*  HCT 34.6* 40.8  MCV 95.8  98.3  MCH 29.6 30.1  MCHC 30.9 30.6  RDW 19.7* 20.9*  PLT 239 175   Thyroid  No results for input(s): TSH, FREET4 in the last 168 hours.  BNPNo results for input(s): BNP, PROBNP in the last 168 hours.  DDimer No results for input(s): DDIMER in the last 168 hours.  Radiology/Studies:  DG Chest Portable 1 View Result Date: 03/28/2024 CLINICAL DATA:  Pacemaker firing, preceded by chest pain. EXAM: PORTABLE CHEST 1 VIEW COMPARISON:  03/18/2024 FINDINGS: Normal-sized heart. Tortuous and partially calcified thoracic aorta. Mildly improved bilateral patchy density and mildly improved more confluence linear density in the left mid to lower lung zone. Otherwise, clear lungs with normal vascularity. No pleural fluid seen. Stable left subclavian pacer and AICD leads. Stable right neck neural stimulator lead. No acute bony abnormality. IMPRESSION: Mildly improved bilateral patchy atelectasis and/or pneumonia. Electronically Signed   By: Elspeth Bathe M.D.   On: 03/28/2024 17:33     Assessment and Plan:   Steven Guzman is a 83 y.o. male with a hx of CAD c/b cardiac arrest (1995) s/p multiple PCIs (most recent LAD in 2011), NSTEMI (03/18/22), ICM (EF = 30%), VT s/p Bsci DC ICD (2010) s/p VT ablation (2016), Barostim (9/24), PAF (on Eliquis ), HTN, HLD, COPD who is being seen 03/28/2024 for the evaluation of VT and ICD shock at the request of Dr. Elnor.  #Sustained VT #VT s/p Ablation - Patient presenting with multiple runs of NSVT and sustained VT and ultimately underwent an ICD shock on 12/30. - Unclear exactly what the trigger for his current VT is; however, he has had multiple respiratory illnesses as well as FTT this past month which is certainly at stressor. - He has underlying ischemic cardiomyopathy as well and so he certainly has a substrate for monomorphic VT. - For now we will start IV amiodarone  to prevent  him from going back into sustained VT at this time. - He will ultimately need to be seen by EP for further evaluation. Start IV amiodarone  Hold p.o. amiodarone  Continue p.o. mexiletine 200 mg twice daily Consult EP in the morning Recent echocardiogram so will not repeat at this time  #Chronic ICM - He appears well compensated at this time and certainly does not appear to be fluid overloaded at least externally. -We will continue his home diuretic for now. Continue Lasix  40 mg daily Continue carvedilol  3.125 mg twice daily  #PAF Continue Eliquis  Amiodarone  and Coreg  as above  #CAD s/p Multiple PCIs #HLD - On Eliquis  and aspirin  as an outpatient. -Not sure that he needs to be on both Eliquis  and aspirin  particularly given, she bruises. - Will hold aspirin  for now and possibly discontinue at discharge. Continue Eliquis    Risk Assessment/Risk Scores:       New York  Heart Association (NYHA) Functional Class NYHA Class II  CHA2DS2-VASc Score = 5   This indicates a 7.2% annual risk of stroke. The patient's score is based upon: CHF History: 1 HTN History: 1 Diabetes History: 0 Stroke History: 0 Vascular Disease History: 1 Age Score: 2 Gender Score: 0        For questions or updates, please contact [facility] Please consult [email or web address] for contact info under      Signed, Georganna Archer, MD  03/28/2024 6:52 PM      [1]  Allergies Allergen Reactions   Sulfa Antibiotics Hives   Cephalexin  Itching   "

## 2024-03-28 NOTE — TOC Initial Note (Addendum)
 Transition of Care [facility]) - Initial/Assessment Note    Patient Details  Name: Steven Guzman MRN: [id number] Date of Birth: 10-Apr-1940  Transition of Care [facility]) CM/SW Contact:    Bernardino Dean, LCSWA Phone Number: 03/28/2024, 9:19 PM  Clinical Narrative:                  Please see recent progress note published by this author 12/30 at 1808.   Expected Discharge Plan: Skilled Nursing Facility Barriers to Discharge: English As A Second Language Teacher, Continued Medical Work up, No SNF bed   Patient Goals and CMS Choice Patient states their goals for this hospitalization and ongoing recovery are:: Achieve SNF placement, complete rehab, return home with wife CMS Medicare.gov Compare Post Acute Care list provided to:: Patient Choice offered to / list presented to : Patient [facility] ownership interest in [facility].provided to:: Patient    Expected Discharge Plan and Services In-house Referral: Clinical Social Work   Post Acute Care Choice: Skilled Nursing Facility Living arrangements for the past 2 months: Single Family Home                                      Prior Living Arrangements/Services Living arrangements for the past 2 months: Single Family Home Lives with:: Spouse Patient language and need for interpreter reviewed:: Yes Do you feel safe going back to the place where you live?: Yes      Need for Family Participation in Patient Care: Yes (Comment) Care giver support system in place?: No (comment) Current home services: DME, Home PT, Home OT Criminal Activity/Legal Involvement Pertinent to Current Situation/Hospitalization: No - Comment as needed  Activities of Daily Living      Permission Sought/Granted                  Emotional Assessment Appearance:: Appears stated age Attitude/Demeanor/Rapport: Gracious Affect (typically observed): Appropriate Orientation: : Oriented to Self, Oriented to Place, Oriented to  Time, Oriented to  Situation Alcohol  / Substance Use: Not Applicable Psych Involvement: No (comment)  Admission diagnosis:  Paroxysmal ventricular tachycardia (HCC) [I47.20] New onset a-fib [facility]) [I48.91] Patient Active Problem List   Diagnosis Date Noted   Paroxysmal ventricular tachycardia (HCC) 03/28/2024   New onset a-fib (HCC) 03/28/2024   Pneumonia 03/12/2024   Prediabetes 03/12/2024   History of abdominal aortic aneurysm (AAA) 03/03/2024   Chronic idiopathic thrombocytopenia (HCC) 03/03/2024   Former smoker 03/03/2024   Bilateral sensorineural hearing loss 03/08/2023   Age-related vocal fold atrophy 08/03/2022   Inguinal hernia 04/09/2022   AAA (abdominal aortic aneurysm) 04/09/2022   NSTEMI (non-ST elevated myocardial infarction) (HCC) 03/18/2022   Closed displaced fracture of right femoral neck (HCC) 02/06/2022   Major depressive disorder with single episode, in partial remission 03/05/2020   Purpura 08/08/2019   Xerosis cutis 05/25/2019   Paraseptal emphysema (HCC) 12/15/2018   Tobacco abuse 12/15/2018   Left inguinal hernia 11/15/2018   Spondylolisthesis at L5-S1 level 10/14/2018   Senile osteoporosis 10/14/2018   Weight loss 09/29/2018   Lumbar trigger point syndrome 04/20/2018   GERD (gastroesophageal reflux disease)    Esophageal stricture    Degenerative cervical spinal stenosis 10/27/2017   Carotid bruit 10/27/2017   B12 deficiency 08/25/2017   Anemia 06/10/2017   BPH (benign prostatic hyperplasia) 12/31/2016   Mass of throat 06/30/2015   Depression 05/24/2015   AF (atrial fibrillation) (HCC) 03/15/2015   Acute on  chronic combined systolic and diastolic CHF (congestive heart failure) (HCC) 11/18/2014   COPD GOLD GRADE C 12/25/2013   CAD (coronary artery disease) 11/23/2012   Smokers' cough (HCC) 11/23/2012   Essential hypertension 11/23/2012   Hyperlipidemia 11/23/2012   Atypical chest pain 11/23/2012   ICD (implantable cardioverter-defibrillator) in place 11/23/2012    PCP:  Esmeralda Morton SAUNDERS, PA-C Pharmacy:   [facility] [id number] - [place], [place] - [address] [place] [address] Phone: [phone] Fax: [phone]  [facility] [address] [place] [address] Phone: [phone] Fax: [phone]     Social Drivers of Health (SDOH) Social History: SDOH Screenings   Food Insecurity: No Food Insecurity (03/13/2024)  Housing: Low Risk (03/13/2024)  Transportation Needs: No Transportation Needs (03/27/2024)   Received from [facility]  Utilities: Not At Risk (03/13/2024)  Alcohol  Screen: Low Risk (03/06/2021)  Depression (PHQ2-9): Low Risk (03/12/2022)  Financial Resource Strain: Medium Risk (03/06/2021)  Physical Activity: Insufficiently Active (03/06/2021)  Social Connections: Moderately Isolated (03/13/2024)  Stress: No Stress Concern Present (12/27/2021)   Received from [facility], [facility] visits prior to 05/30/2022.  Tobacco Use: High Risk (03/12/2024)   SDOH Interventions:     Readmission Risk Interventions    03/14/2024    4:52 PM  Readmission Risk Prevention Plan  Transportation Screening Complete  Medication Review (RN Care Manager) Complete  HRI or Home Care Consult Complete  Palliative Care Screening Not Applicable  Skilled Nursing Facility Not Applicable

## 2024-03-28 NOTE — TOC CM/SW Note (Signed)
 TOC consult received for d/c planning needs, possible SNF placement. Therapy recs will be needed for placement. Follow-up to be completed with patient as appropriate.   Merilee Batty, MSN, RN Case Management [phone]

## 2024-03-29 ENCOUNTER — Telehealth (HOSPITAL_COMMUNITY): Payer: Self-pay

## 2024-03-29 ENCOUNTER — Other Ambulatory Visit (HOSPITAL_COMMUNITY): Payer: Self-pay

## 2024-03-29 ENCOUNTER — Encounter (HOSPITAL_COMMUNITY): Payer: Self-pay | Admitting: Internal Medicine

## 2024-03-29 DIAGNOSIS — E43 Unspecified severe protein-calorie malnutrition: Secondary | ICD-10-CM | POA: Insufficient documentation

## 2024-03-29 DIAGNOSIS — Z681 Body mass index (BMI) 19 or less, adult: Secondary | ICD-10-CM | POA: Diagnosis not present

## 2024-03-29 DIAGNOSIS — N400 Enlarged prostate without lower urinary tract symptoms: Secondary | ICD-10-CM | POA: Diagnosis present

## 2024-03-29 DIAGNOSIS — Z4502 Encounter for adjustment and management of automatic implantable cardiac defibrillator: Secondary | ICD-10-CM

## 2024-03-29 DIAGNOSIS — I4719 Other supraventricular tachycardia: Secondary | ICD-10-CM

## 2024-03-29 DIAGNOSIS — I5022 Chronic systolic (congestive) heart failure: Secondary | ICD-10-CM | POA: Diagnosis present

## 2024-03-29 DIAGNOSIS — Z7901 Long term (current) use of anticoagulants: Secondary | ICD-10-CM | POA: Diagnosis not present

## 2024-03-29 DIAGNOSIS — J9621 Acute and chronic respiratory failure with hypoxia: Secondary | ICD-10-CM | POA: Diagnosis present

## 2024-03-29 DIAGNOSIS — I959 Hypotension, unspecified: Secondary | ICD-10-CM | POA: Diagnosis not present

## 2024-03-29 DIAGNOSIS — F17210 Nicotine dependence, cigarettes, uncomplicated: Secondary | ICD-10-CM | POA: Diagnosis present

## 2024-03-29 DIAGNOSIS — N183 Chronic kidney disease, stage 3 unspecified: Secondary | ICD-10-CM | POA: Diagnosis present

## 2024-03-29 DIAGNOSIS — R531 Weakness: Secondary | ICD-10-CM

## 2024-03-29 DIAGNOSIS — R627 Adult failure to thrive: Secondary | ICD-10-CM | POA: Diagnosis present

## 2024-03-29 DIAGNOSIS — D631 Anemia in chronic kidney disease: Secondary | ICD-10-CM | POA: Diagnosis present

## 2024-03-29 DIAGNOSIS — I255 Ischemic cardiomyopathy: Secondary | ICD-10-CM | POA: Diagnosis present

## 2024-03-29 DIAGNOSIS — E876 Hypokalemia: Secondary | ICD-10-CM | POA: Diagnosis present

## 2024-03-29 DIAGNOSIS — J9811 Atelectasis: Secondary | ICD-10-CM | POA: Diagnosis present

## 2024-03-29 DIAGNOSIS — Z8709 Personal history of other diseases of the respiratory system: Secondary | ICD-10-CM

## 2024-03-29 DIAGNOSIS — I130 Hypertensive heart and chronic kidney disease with heart failure and stage 1 through stage 4 chronic kidney disease, or unspecified chronic kidney disease: Secondary | ICD-10-CM | POA: Diagnosis present

## 2024-03-29 DIAGNOSIS — I472 Ventricular tachycardia, unspecified: Secondary | ICD-10-CM | POA: Diagnosis present

## 2024-03-29 DIAGNOSIS — L89321 Pressure ulcer of left buttock, stage 1: Secondary | ICD-10-CM | POA: Diagnosis present

## 2024-03-29 DIAGNOSIS — K219 Gastro-esophageal reflux disease without esophagitis: Secondary | ICD-10-CM | POA: Diagnosis present

## 2024-03-29 DIAGNOSIS — I480 Paroxysmal atrial fibrillation: Secondary | ICD-10-CM | POA: Diagnosis present

## 2024-03-29 DIAGNOSIS — I2510 Atherosclerotic heart disease of native coronary artery without angina pectoris: Secondary | ICD-10-CM | POA: Diagnosis present

## 2024-03-29 DIAGNOSIS — J449 Chronic obstructive pulmonary disease, unspecified: Secondary | ICD-10-CM | POA: Diagnosis present

## 2024-03-29 DIAGNOSIS — L89316 Pressure-induced deep tissue damage of right buttock: Secondary | ICD-10-CM | POA: Diagnosis present

## 2024-03-29 DIAGNOSIS — F32A Depression, unspecified: Secondary | ICD-10-CM | POA: Diagnosis present

## 2024-03-29 DIAGNOSIS — L89153 Pressure ulcer of sacral region, stage 3: Secondary | ICD-10-CM | POA: Diagnosis present

## 2024-03-29 DIAGNOSIS — R5381 Other malaise: Secondary | ICD-10-CM | POA: Diagnosis not present

## 2024-03-29 DIAGNOSIS — E785 Hyperlipidemia, unspecified: Secondary | ICD-10-CM | POA: Diagnosis present

## 2024-03-29 LAB — URINALYSIS, COMPLETE (UACMP) WITH MICROSCOPIC
Bilirubin Urine: NEGATIVE
Glucose, UA: >=500 mg/dL — AB
Hgb urine dipstick: NEGATIVE
Ketones, ur: NEGATIVE mg/dL
Leukocytes,Ua: NEGATIVE
Nitrite: NEGATIVE
Protein, ur: NEGATIVE mg/dL
Specific Gravity, Urine: 1.009 (ref 1.005–1.030)
pH: 5 (ref 5.0–8.0)

## 2024-03-29 LAB — CBC WITH DIFFERENTIAL/PLATELET
Abs Immature Granulocytes: 0.22 K/uL — ABNORMAL HIGH (ref 0.00–0.07)
Basophils Absolute: 0.1 K/uL (ref 0.0–0.1)
Basophils Relative: 3 %
Eosinophils Absolute: 0 K/uL (ref 0.0–0.5)
Eosinophils Relative: 0 %
HCT: 37.6 % — ABNORMAL LOW (ref 39.0–52.0)
Hemoglobin: 11.2 g/dL — ABNORMAL LOW (ref 13.0–17.0)
Immature Granulocytes: 5 %
Lymphocytes Relative: 23 %
Lymphs Abs: 1 K/uL (ref 0.7–4.0)
MCH: 29.6 pg (ref 26.0–34.0)
MCHC: 29.8 g/dL — ABNORMAL LOW (ref 30.0–36.0)
MCV: 99.2 fL (ref 80.0–100.0)
Monocytes Absolute: 0.4 K/uL (ref 0.1–1.0)
Monocytes Relative: 10 %
Neutro Abs: 2.5 K/uL (ref 1.7–7.7)
Neutrophils Relative %: 59 %
Platelets: 146 K/uL — ABNORMAL LOW (ref 150–400)
RBC: 3.79 MIL/uL — ABNORMAL LOW (ref 4.22–5.81)
RDW: 20.7 % — ABNORMAL HIGH (ref 11.5–15.5)
WBC: 4.2 K/uL (ref 4.0–10.5)
nRBC: 0 % (ref 0.0–0.2)

## 2024-03-29 LAB — COMPREHENSIVE METABOLIC PANEL WITH GFR
ALT: 15 U/L (ref 0–44)
AST: 20 U/L (ref 15–41)
Albumin: 3.3 g/dL — ABNORMAL LOW (ref 3.5–5.0)
Alkaline Phosphatase: 46 U/L (ref 38–126)
Anion gap: 7 (ref 5–15)
BUN: 13 mg/dL (ref 8–23)
CO2: 33 mmol/L — ABNORMAL HIGH (ref 22–32)
Calcium: 8.4 mg/dL — ABNORMAL LOW (ref 8.9–10.3)
Chloride: 93 mmol/L — ABNORMAL LOW (ref 98–111)
Creatinine, Ser: 0.57 mg/dL — ABNORMAL LOW (ref 0.61–1.24)
GFR, Estimated: >60 mL/min
Glucose, Bld: 115 mg/dL — ABNORMAL HIGH (ref 70–99)
Potassium: 3.2 mmol/L — ABNORMAL LOW (ref 3.5–5.1)
Sodium: 133 mmol/L — ABNORMAL LOW (ref 135–145)
Total Bilirubin: 0.9 mg/dL (ref 0.0–1.2)
Total Protein: 5.8 g/dL — ABNORMAL LOW (ref 6.5–8.1)

## 2024-03-29 LAB — BLOOD GAS, VENOUS
Acid-Base Excess: 14 mmol/L — ABNORMAL HIGH (ref 0.0–2.0)
Bicarbonate: 41.2 mmol/L — ABNORMAL HIGH (ref 20.0–28.0)
Drawn by: 4956
O2 Saturation: 23.2 %
Patient temperature: 36.4
pCO2, Ven: 63 mmHg — ABNORMAL HIGH (ref 44–60)
pH, Ven: 7.42 (ref 7.25–7.43)
pO2, Ven: <31 mmHg — CL (ref 32–45)

## 2024-03-29 LAB — MAGNESIUM: Magnesium: 2.1 mg/dL (ref 1.7–2.4)

## 2024-03-29 LAB — PHOSPHORUS: Phosphorus: 2.3 mg/dL — ABNORMAL LOW (ref 2.5–4.6)

## 2024-03-29 LAB — TROPONIN T, HIGH SENSITIVITY: Troponin T High Sensitivity: 28 ng/L — ABNORMAL HIGH (ref 0–19)

## 2024-03-29 LAB — TSH: TSH: 2.05 u[IU]/mL (ref 0.350–4.500)

## 2024-03-29 LAB — PRO BRAIN NATRIURETIC PEPTIDE: Pro Brain Natriuretic Peptide: 810 pg/mL — ABNORMAL HIGH

## 2024-03-29 LAB — VITAMIN B12: Vitamin B-12: >4000 pg/mL — ABNORMAL HIGH (ref 180–914)

## 2024-03-29 MED ORDER — UMECLIDINIUM BROMIDE 62.5 MCG/ACT IN AEPB
1.0000 | INHALATION_SPRAY | Freq: Every day | RESPIRATORY_TRACT | Status: DC
  Administered 2024-03-30 – 2024-04-07 (×8): 1 via RESPIRATORY_TRACT
  Filled 2024-03-29 (×2): qty 7

## 2024-03-29 MED ORDER — ENSURE PLUS HIGH PROTEIN PO LIQD
237.0000 mL | Freq: Three times a day (TID) | ORAL | Status: DC
  Administered 2024-03-29 – 2024-04-06 (×26): 237 mL via ORAL

## 2024-03-29 MED ORDER — PREGABALIN 25 MG PO CAPS
25.0000 mg | ORAL_CAPSULE | Freq: Two times a day (BID) | ORAL | Status: DC
  Administered 2024-03-29 – 2024-04-07 (×18): 25 mg via ORAL
  Filled 2024-03-29 (×18): qty 1

## 2024-03-29 MED ORDER — ARFORMOTEROL TARTRATE 15 MCG/2ML IN NEBU
15.0000 ug | INHALATION_SOLUTION | Freq: Two times a day (BID) | RESPIRATORY_TRACT | Status: DC
  Administered 2024-03-29 – 2024-04-07 (×18): 15 ug via RESPIRATORY_TRACT
  Filled 2024-03-29 (×19): qty 2

## 2024-03-29 MED ORDER — COLLAGENASE 250 UNIT/GM EX OINT
TOPICAL_OINTMENT | Freq: Every day | CUTANEOUS | Status: DC
  Filled 2024-03-29 (×2): qty 30

## 2024-03-29 MED ORDER — LEVALBUTEROL HCL 1.25 MG/0.5ML IN NEBU: 1.2500 mg | INHALATION_SOLUTION | RESPIRATORY_TRACT | Status: DC | PRN

## 2024-03-29 MED ORDER — POTASSIUM CHLORIDE CRYS ER 20 MEQ PO TBCR
40.0000 meq | EXTENDED_RELEASE_TABLET | ORAL | Status: AC
  Administered 2024-03-29 (×3): 40 meq via ORAL
  Filled 2024-03-29 (×3): qty 2

## 2024-03-29 MED ORDER — ADULT MULTIVITAMIN W/MINERALS CH
1.0000 | ORAL_TABLET | Freq: Every day | ORAL | Status: DC
  Administered 2024-03-29 – 2024-04-07 (×10): 1 via ORAL
  Filled 2024-03-29 (×10): qty 1

## 2024-03-29 MED ORDER — THIAMINE MONONITRATE 100 MG PO TABS
100.0000 mg | ORAL_TABLET | Freq: Every day | ORAL | Status: AC
  Administered 2024-03-29 – 2024-04-04 (×7): 100 mg via ORAL
  Filled 2024-03-29 (×7): qty 1

## 2024-03-29 MED ORDER — ESCITALOPRAM OXALATE 10 MG PO TABS
20.0000 mg | ORAL_TABLET | Freq: Every day | ORAL | Status: DC
  Administered 2024-03-29 – 2024-04-07 (×10): 20 mg via ORAL
  Filled 2024-03-29 (×10): qty 2

## 2024-03-29 MED ORDER — POTASSIUM CHLORIDE CRYS ER 20 MEQ PO TBCR: 20.0000 meq | EXTENDED_RELEASE_TABLET | Freq: Every day | ORAL | Status: DC

## 2024-03-29 MED ORDER — JUVEN PO PACK
1.0000 | PACK | Freq: Two times a day (BID) | ORAL | Status: DC
  Administered 2024-03-29 – 2024-04-07 (×18): 1 via ORAL
  Filled 2024-03-29 (×17): qty 1

## 2024-03-29 MED ORDER — METOPROLOL SUCCINATE ER 50 MG PO TB24
50.0000 mg | ORAL_TABLET | Freq: Every day | ORAL | Status: DC
  Administered 2024-03-29 – 2024-04-01 (×4): 50 mg via ORAL
  Filled 2024-03-29 (×4): qty 1

## 2024-03-29 MED ORDER — TAMSULOSIN HCL 0.4 MG PO CAPS
0.4000 mg | ORAL_CAPSULE | Freq: Every day | ORAL | Status: DC
  Administered 2024-03-29 – 2024-04-07 (×10): 0.4 mg via ORAL
  Filled 2024-03-29 (×10): qty 1

## 2024-03-29 NOTE — Consult Note (Signed)
 WOC Nurse Consult Note: Reason for Consult: assistance for proper staging/care of wounds to buttocks and coccyx  Wound type: Scalp; skin tear; flap lost; clean; bloody Right upper arm; skin tear; flap loose and non-viable; wound clean  Right buttock; DTPI; dark purple non-blanchable  Left buttock; CSD-chronic skin damage, Stage 1 discoloration; red Sacrum: Stage 3 Pressure Injury; 75% yellow/white-25% pink  Right elbow; skin tear; skin flap lost; yellow drainage Pressure Injury POA: Yes Measurement: see nrusing flow sheets when 2 nurse skin assessment completed  Wound bed:see above  Drainage (amount, consistency, odor) see nursing flow sheet Periwound:intact  Dressing procedure/placement/frequency: Arm and scalp; utilize nursing skin care order set for skin tears Sacrum; Santyl and gauze dressing daily Left buttock; foam Right buttock; xeroform and foam Air mattress for moisture management and pressure redistribution Turn and reposition per hospital policy Use Prevalon boots if patient high risk, not moving in the bed, low Braden   Re consult if needed, will not follow at this time. Thanks  Advith Martine [employer], RN,CWOCN, CNS, [employer] [phone]

## 2024-03-29 NOTE — Progress Notes (Addendum)
 Initial Nutrition Assessment  DOCUMENTATION CODES:  Severe malnutrition in context of chronic illness  INTERVENTION:  Ensure Plus High Protein PO TID. Each supplement provides 350 Kcals and 20 grams of protein. Patient encouraged to drink more than 3 a day if able. 1 packet Juven BID to support wound healing. Each packet provides 95 calories, 2.5 grams of protein (collagen), and 9.8 grams of carbohydrate (3 grams sugar); also contains 7 grams of L-arginine and L-glutamine, 300 mg vitamin C, 15 mg vitamin E, 1.2 mcg vitamin B-12, 9.5 mg zinc, 200 mg calcium , and 1.5 g Calcium  Beta-hydroxy-Beta-methylbutyrate. Multivitamin PO once daily. 100 mg thiamine PO once daily for 7 days. Double protein allowance for each meal. Continue regular diet. Notes from conversation today added to discharge instructions per patient's request and attached High Protein and High-Calorie Diet handout.  NUTRITION DIAGNOSIS:  Severe Malnutrition related to chronic illness as evidenced by severe fat depletion, severe muscle depletion, percent weight loss   GOAL:  Patient will meet greater than or equal to 90% of their needs, Weight gain   MONITOR:  PO intake, Supplement acceptance, Labs, Weight trends, Skin, I & O's  REASON FOR ASSESSMENT:  Consult Assessment of nutrition requirement/status  ASSESSMENT:  Patient presented with tachycardia secondary to ICD firing and was found to have multiple pressure injuries as well. PMH significant for recent PNA admission discharged home on 12/26 after declining SNF since he is his wife's primary caretaker, CHF, aAfib, CAD, MI 1994/2011, HTN, inguinal hernia repair 03/2022, CKD, COPD, diverticulosis, and anemia.  Visited the very pleasant patient who was eating a pork chop and fruits during our visit. He states that he used to weigh 206 lbs about 18 months ago and has been gradually losing weight since even though he has been eating the same amount. He saw a doctor about it  before who thought he might have just been losing weight because of lasix . He has a great appetite and is able to eat more. He tells me that he developed the pressure injuries two weeks ago while he was hospitalized. He was ambulating with a cane previously but has been too weak to ambulate much the past two weeks. He takes care of his wife at home and she is now in the ER here because she called the police when she was home alone after he was admitted here. We had an extensive discussion about how he can increase his caloric and protein intake. He is missing his teeth and broke his upper plate for the third time recently but tells me he is managing softer foods. He has three teeth that are pending extraction but states they do no hurt. The patient requests that I list the points we spoke about in his discharge instructions.   Typical day's intake: Breakfast - oatmeal or cereal with whole milk, coffee and cream Lunch - pimento cheese sandwich or skips lunch if he ate a late breakfast Dinner - a meat with a vegetable. Snack - fruit smoothie he makes with protein powder ONS - drinks Premier Protein or Ensure occasionally.  Points that will be included in patient's discharge instructions from our conversation:  - Boost VHC or equivalent oral supplement with as high calories (over 500) per carton as possible. Drink more than 3 a day if able and try putting one in your daily smoothie. - Increase protein intake and ensure there is a good source of protein at every meal or snack. Add eggs to your breakfast and a meat  to your lunch or soup. - 1 packet Juven twice daily to support wound healing. - Multivitamin once daily. - Choose softer meats or vegetables while you are missing your teeth/plate.  Scheduled Meds:  apixaban   5 mg Oral BID   arformoterol   15 mcg Nebulization BID   And   umeclidinium bromide   1 puff Inhalation Daily   Chlorhexidine  Gluconate Cloth  6 each Topical Daily   collagenase    Topical Daily   furosemide   40 mg Oral Daily   metoprolol  succinate  50 mg Oral QHS   mexiletine  200 mg Oral Q12H   potassium chloride  SA  40 mEq Oral Q4H   tamsulosin   0.4 mg Oral Daily   Continuous Infusions:  amiodarone  30 mg/hr (03/29/24 1222)   PRN Meds:.acetaminophen  **OR** acetaminophen , levalbuterol , melatonin, ondansetron  (ZOFRAN ) IV, polyethylene glycol, senna  Diet Order             Diet regular Fluid consistency: Thin  Diet effective now                  Meal Intake: 100% per patient; patient also ordering multiple snacks like cookies via room service.  Labs:     Latest Ref Rng & Units 03/29/2024    4:44 AM 03/28/2024    3:53 PM 03/23/2024    2:34 AM  CMP  Glucose 70 - 99 mg/dL 884  99  891   BUN 8 - 23 mg/dL 13  16  15    Creatinine 0.61 - 1.24 mg/dL 9.42  9.30  9.47   Sodium 135 - 145 mmol/L 133  132  132   Potassium 3.5 - 5.1 mmol/L 3.2  4.1  4.2   Chloride 98 - 111 mmol/L 93  91  92   CO2 22 - 32 mmol/L 33  30  35   Calcium  8.9 - 10.3 mg/dL 8.4  8.8  8.3   Total Protein 6.5 - 8.1 g/dL 5.8     Total Bilirubin 0.0 - 1.2 mg/dL 0.9     Alkaline Phos 38 - 126 U/L 46     AST 15 - 41 U/L 20     ALT 0 - 44 U/L 15        I/O: +100 mL since admit  NUTRITION - FOCUSED PHYSICAL EXAM: Flowsheet Row Most Recent Value  Orbital Region Severe depletion  Upper Arm Region Severe depletion  Thoracic and Lumbar Region Severe depletion  Buccal Region Severe depletion  Temple Region Severe depletion  Clavicle Bone Region Severe depletion  Clavicle and Acromion Bone Region Severe depletion  Scapular Bone Region Severe depletion  Dorsal Hand Severe depletion  Patellar Region Severe depletion  Anterior Thigh Region Severe depletion  Posterior Calf Region Severe depletion  Edema (RD Assessment) Mild  Hair Reviewed  Eyes Reviewed  Mouth Other (Comment)  [missing teeth]  Skin Reviewed  Nails Reviewed    EDUCATION NEEDS:  Education needs have been  addressed  Skin:  Skin Assessment: Skin Integrity Issues: Skin Integrity Issues:: Stage I, DTI, Stage III DTI: R buttock Stage I: L buttock Stage III: sacrum  Last BM:  none charted  Height:  Ht Readings from Last 1 Encounters:  03/29/24 5' 11 (1.803 m)   Weight:  Wt Readings from Last 10 Encounters:  03/29/24 61.1 kg  03/24/24 65.5 kg  03/05/24 65.7 kg  02/09/24 65.8 kg  01/20/24 68.3 kg  12/28/23 68 kg  11/18/23 68 kg  11/12/23 72.1 kg  07/19/23 72.1  kg  06/11/23 73.1 kg   Weight Change: 7 Kg (10%) loss in 3 months - clinically significant  Usual Body Weight: 206 lbs 18 months ago  Edema: non-pitting BLE  Ideal Body Weight:  78.2 kg   BMI:  Body mass index is 18.8 kg/m.  Estimated Daily Nutritional Needs:  Kcal:  2000-2200 Protein:  120-140 g Fluid:  >/=2000 mL    Leverne Ruth, MS, RDN, LDN [place]. [facility] See AMION for contact information Secure chat preferred

## 2024-03-29 NOTE — Consult Note (Addendum)
 "  Cardiology Consultation   Patient ID: Steven Guzman MRN: [id number]; DOB: [birth year]  Admit date: 03/28/2024 Date of Consult: 03/29/2024  PCP:  Esmeralda Morton SAUNDERS, PA-C   [facility] Providers Cardiologist:  Jerel Balding, MD  Cardiology APP:  Lesia Ozell Barter, PA-C  Electrophysiologist:  Danelle Birmingham, MD    Patient Profile: Steven Guzman is a 83 y.o. male with a hx of  HTN, HLD, AAA, COPD, GERD, hiatal hernia CAD (s/p inferior MI complicated by cardiac arrest in 1995 s/p stenting to RCA, PCI with stenting to LAD in 2011, NSTEMI in 03/18/2022 due to occlusion of OM 3 treated medically) ICM, chronic CHF (systolic) VT AFib, Atach    who is being seen 03/29/2024 for the evaluation of ICD shock at the request of Dr. Floretta.  History of Present Illness: Steven Guzman last saw EP team, Jodie 01/20/24, doing OK, some increase in SOB, had returned to smoking (under stress w/wife's illness) and reported drinking ~2quarts of ice tea daily Both devices checked with reported normal function Lasix  added, educated on volume intake Low AF burden No VT Maintained on his amio and mexiletine  He saw cards team 02/09/24, some degree of chronic SOB (COPD, volume), still smoking. Some gait instability, dizziness, reported medication compliance  OF LATE: -- admitted early December where he was hospitalized for an acute heart failure exacerbation and hypoxic respiratory failure.  He was diuresed and ultimately recommended discharge to a SNF but the patient preferred to go home due to the need to care for his ailing wife.   -- He was subsequently readmitted 12/14-12/16 for presumed multifocal pneumonia.  He was treated with a course of antibiotics and cultures demonstrated colonization of Pseudomonas and Klebsiella.  He was also found to have a rounded ill-defined masslike consolidative opacity in the left upper lobe concerning for possible malignancy versus pneumonia as well.   He was ultimately discharged after completing his antibiotic course within the hospitalization.   Discharged 03/24/24 -- ER 12/29, after a fall, discharged  -- ER here 12/29/ADMITTED last night he reported that he was becoming extremely stressed trying to order delivery food for him and his wife and was unsuccessful.  He tried again earlier this morning and had additional stress.  Earlier this afternoon the patient developed some very mild left-sided chest discomfort and ultimately suffered an ICD shock x1 at 3 PM.  This prompted him to call EMS and was brought into the ED for evaluation.  Cardiology consulted reported ICD check c/w appropriate tx for VT Placed on amiodarone  gtt  He tells me that (despite what is charted) he did in fact miss one day if not two days of his medications, particularly his amio, mexiletine, carvedilol  He is generally weaker then baseline with recent PNA/hospitalizations, no CP, + cough, not SOB   LABS K+ 4.1 > 3.2 (replacement already ordered) Mag 2.1, 2.1 BUN/Creat 16/0.69 > 0.57 BNP 791, 810 HS Trop T 26, 26, 28 WBC 4.2 H/H 11/37 Plts 146  Device interrogation: Presenting rhythm is AS/VS, PACs, PVC Battery is OK Auto lead testing is OK HV impedance is 60 Ohms Heart logic is 6 but trending down Numerous episodes labeled: NSVTs And VT one zone episodes (untreated) as well as treated Dec 19-30th  In my personal review EGMs c/w inappropriate shocks 2/2 AT Morphology is c/w conducted morphology and appear to be atrially driven    Device information [employer] dual chamber ICD implanted 08/2009, gen change 03/2022  (GORE  lead) Barostim (standard) implanted 12/17/2022   Arrhythmia hx Dec 2015 PMVT with ICD w/HV therapies (in setting of missed meds) 2016 VT tx w/ATP  12/22/2014 EPS/ablation (extensive scar on the left ventricular septum) intereval notes report slow VT's observed, pace terminated in the office 07/21/2016 and in the hospital Jun  2018  Amiodarone  an active medicine at least since 2014 mexilletine started April 2018  + AFib/Atach  Past Medical History:  Diagnosis Date   AICD (automatic cardioverter/defibrillator) present    [employer] EL ICD D121/ [phone]   Aneurysm    a. Aneurysmal infrarenal aorta up to 33 mm on CT 10/2014, recommended f/u due 10/2017   Anginal pain    Anxiety    Arthritis    right foot   Basal cell carcinoma of nose    S/P MOHS   Biliary acute pancreatitis    CAD (coronary artery disease)    a. s/p MI in 1994 with PCI to LAD at that time b. cath 10/2012 demonstrated EF 30%, inferior akinesis with mild hypokinesis of all walls, patent LAD and RCA stents; ostial PDA with 80-90% obstruction with medical therapy recommended    Chronic systolic CHF (congestive heart failure) (HCC)    EF 30 to 35 % as of 09/2014.    CKD (chronic kidney disease), stage III (HCC)    Complication of anesthesia 10/2014   had to have defibrillator w/ERCP- CODED after having gallstones removed   COPD (chronic obstructive pulmonary disease) (HCC)    a. followed by pulmonary, COPD GOLD stage II   Depression    Diverticulosis of colon 07/2014   noted on CT   Dyspnea    with exertion   GERD (gastroesophageal reflux disease)    Hiatal hernia    large   History of kidney stones    passed stone   Hyperglycemia 10/2012   Hyperlipidemia    Hypertension    Myocardial infarction [facility]) 1994; 2011   Pneumonia 1946; 2015   Prostate enlargement 07/2014   observed on CT   Tobacco abuse    Ventricular tachycardia (HCC)    a. 08/2009 s/p BSX E110 Teligen 100 AICD, ser#: [id number];  b. 08/2008 VT req ATP - detection reprogrammed from 160 to 150. c. EPS and VT ablation by Dr. Waddell 12/21/2014    Past Surgical History:  Procedure Laterality Date   BIOPSY  12/21/2017   Procedure: BIOPSY;  Surgeon: Abran Norleen SAILOR, MD;  Location: [facility];  Service: Endoscopy;;   CATARACT EXTRACTION W/ INTRAOCULAR LENS  IMPLANT,  BILATERAL Bilateral ~ 2011   COLONOSCOPY     COLONOSCOPY WITH PROPOFOL  N/A 12/21/2017   Procedure: COLONOSCOPY WITH PROPOFOL ;  Surgeon: Abran Norleen SAILOR, MD;  Location: [facility];  Service: Endoscopy;  Laterality: N/A;   ELECTROPHYSIOLOGIC STUDY N/A 12/21/2014   Procedure: V Tach Ablation;  Surgeon: Danelle LELON Waddell, MD;  Location: [facility] INVASIVE CV LAB;  Service: Cardiovascular;  Laterality: N/A;   ERCP N/A 11/16/2014   Procedure: ENDOSCOPIC RETROGRADE CHOLANGIOPANCREATOGRAPHY (ERCP);  Surgeon: Lamar JONETTA Aho, MD;  Location: [facility] ENDOSCOPY;  Service: Endoscopy;  Laterality: N/A;   ESOPHAGOGASTRODUODENOSCOPY (EGD) WITH PROPOFOL  N/A 12/21/2017   Procedure: ESOPHAGOGASTRODUODENOSCOPY (EGD) WITH PROPOFOL ;  Surgeon: Abran Norleen SAILOR, MD;  Location: [facility];  Service: Endoscopy;  Laterality: N/A;   EYE SURGERY     FOOT SURGERY Left 2005   fixed bone that stuck out in my ankle area   HEMORRHOID BANDING     ICD GENERATOR CHANGEOUT N/A 04/20/2022  Procedure: ICD GENERATOR CHANGEOUT;  Surgeon: Waddell Danelle ORN, MD;  Location: [facility] INVASIVE CV LAB;  Service: Cardiovascular;  Laterality: N/A;   IMPLANTABLE CARDIOVERTER DEFIBRILLATOR IMPLANT  09/06/09   BSX dual chamber ICD implanted in [place]  for cardiac arrest 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   INGUINAL HERNIA REPAIR Right ~ 1995   INGUINAL HERNIA REPAIR Left 04/10/2022   Procedure: HERNIA REPAIR INGUINAL ADULT;  Surgeon: Ebbie Cough, MD;  Location: [facility] OR;  Service: General;  Laterality: Left;   LEFT HEART CATH AND CORONARY ANGIOGRAPHY N/A 03/19/2022   Procedure: LEFT HEART CATH AND CORONARY ANGIOGRAPHY;  Surgeon: Jordan, Peter M, MD;  Location: [facility] INVASIVE CV LAB;  Service: Cardiovascular;  Laterality: N/A;   LEFT HEART CATHETERIZATION WITH CORONARY ANGIOGRAM N/A 11/25/2012   demonstrated EF 30%, inferior akinesis with mild hypokinesis of all walls, patent LAD and RCA stents; ostial PDA with 80-90% obstruction with medical therapy recommended   MOHS SURGERY   2008   nose, skin graft   POLYPECTOMY  12/21/2017   Procedure: POLYPECTOMY;  Surgeon: Abran Norleen SAILOR, MD;  Location: [facility];  Service: Endoscopy;;   RETINAL DETACHMENT SURGERY Right 2013   TENOLYSIS Right 12/21/2013   Procedure: TENOLYSIS FLEXOR CARPI RADIALIS ,DEBRIDEMENT RIGHT JOINT WRIST,DEBRIDEMENT SCAPHOTRAPEZIAL TRAPEZOID, REPAIR OF EXTENSOR HOOD;  Surgeon: Arley Curia, MD;  Location: [facility];  Service: Orthopedics;  Laterality: Right;   TOE SURGERY Right 09/2019   3rd toe/hammer toe   V-TACH ABLATION  12/21/2014   VIDEO BRONCHOSCOPY Bilateral 01/09/2016   Procedure: VIDEO BRONCHOSCOPY WITHOUT FLUORO;  Surgeon: Vicenta KATHEE Lennert, MD;  Location: [facility];  Service: Cardiopulmonary;  Laterality: Bilateral;     Home Medications:  Prior to Admission medications  Medication Sig Start Date End Date Taking? Authorizing Provider  acetaminophen  (TYLENOL ) 500 MG tablet Take 1,000 mg by mouth every 6 (six) hours as needed for moderate pain.   Yes [provider]  albuterol  (PROVENTIL ) (2.5 MG/3ML) 0.083% nebulizer solution USE 1 VIAL VIA NEBULIZER EVERY 6 HOURS AS NEEDED FOR WHEEZING OR SHORTNESS OF BREATH 07/18/19  Yes Olalere, Adewale A, MD  amiodarone  (PACERONE ) 200 MG tablet Take 1 tablet (200 mg total) by mouth daily. \ 08/27/23  Yes Lesia Ozell Barter, PA-C  Artificial Tear Solution (SOOTHE XP OP) Place 2 drops into both eyes daily as needed (dry eyes).   Yes [provider]  Ascorbic Acid (VITAMIN C) 1000 MG tablet Take 1,000 mg by mouth daily.   Yes [provider]  aspirin  EC 81 MG tablet Take 1 tablet (81 mg total) by mouth daily. Swallow whole. 03/21/22  Yes Meng, Hao, PA  Biotin  5000 MCG CAPS Take 5,000 mcg by mouth daily.   Yes [provider]  Calcium  Citrate-Vitamin D  (CALCIUM  + D PO) Take 1 tablet by mouth daily.   Yes [provider]  carvedilol  (COREG ) 3.125 MG tablet Take 1 tablet (3.125 mg total) by mouth 2  (two) times daily. 04/26/23  Yes Fountain, Madison L, NP  cetirizine (ZYRTEC) 10 MG tablet Take 10 mg by mouth daily.   Yes [provider]  Cholecalciferol  50 MCG (2000 UT) TABS Take 2,000 Units by mouth daily at 6 (six) AM. 09/12/18  Yes [provider]  Cyanocobalamin  (B12 LIQUID HEALTH BOOSTER PO) Take 5,000 mcg by mouth daily.   Yes [provider]  Docusate Sodium  (DSS) 100 MG CAPS Take 2 capsules by mouth daily in the afternoon. 02/10/22  Yes [provider]  ELIQUIS  5  MG TABS tablet TAKE 1 TABLET BY MOUTH TWICE A DAY 01/11/24  Yes Croitoru, Mihai, MD  empagliflozin  (JARDIANCE ) 10 MG TABS tablet Take 1 tablet (10 mg total) by mouth daily. 04/19/23  Yes Croitoru, Mihai, MD  escitalopram  (LEXAPRO ) 20 MG tablet Take 20 mg by mouth daily. 11/03/23  Yes [provider]  Evolocumab  (REPATHA  SURECLICK) 140 MG/ML SOAJ INJECT 1 ML INTO THE SKIN EVERY 14 DAYS 01/10/24  Yes Croitoru, Mihai, MD  feeding supplement (ENSURE PLUS HIGH PROTEIN) LIQD Take 237 mLs by mouth 2 (two) times daily between meals for 7 days. 03/24/24 03/31/24 Yes Christobal Guadalajara, MD  fluticasone  (FLONASE ) 50 MCG/ACT nasal spray Place 2 sprays into both nostrils daily. Patient taking differently: Place 2 sprays into both nostrils as needed for allergies. 02/13/21  Yes Fargo, Amy E, NP  furosemide  (LASIX ) 40 MG tablet Take 1 tablet (40 mg total) by mouth daily. 03/05/24 06/03/24 Yes Fairy Frames, MD  Guaifenesin  1200 MG TB12 Take 1,200 mg by mouth daily.   Yes [provider]  Ipratropium-Albuterol  (COMBIVENT  RESPIMAT) 20-100 MCG/ACT AERS respimat Inhale 1 puff into the lungs every 6 (six) hours. Shortness of breath or wheezing Patient taking differently: Inhale 1 puff into the lungs every 6 (six) hours as needed for wheezing or shortness of breath. Shortness of breath or wheezing 09/18/21  Yes Fargo, Amy E, NP  IRON PO Take 1 tablet by mouth daily.   Yes [provider]  Lactobacillus  (ACIDOPHILUS) CAPS capsule Take 1 capsule by mouth 2 (two) times daily. 03/24/24 04/23/24 Yes Christobal Guadalajara, MD  lidocaine  (LIDODERM ) 5 % Place 1 patch onto the skin daily. Remove & Discard patch within 12 hours or as directed by MD Patient taking differently: Place 1 patch onto the skin daily as needed. Remove & Discard patch within 12 hours or as directed by MD 03/25/24  Yes Christobal Guadalajara, MD  Magnesium  200 MG TABS Take 400 mg by mouth daily.   Yes [provider]  melatonin 3 MG TABS tablet Take 3 mg by mouth at bedtime.   Yes [provider]  mexiletine (MEXITIL ) 200 MG capsule TAKE 1 CAPSULE BY MOUTH 2 TIMES A DAY 02/17/24  Yes Fountain, Madison L, NP  Multiple Vitamins-Minerals (CENTRUM ADULTS PO) Take 1 tablet by mouth daily.   Yes [provider]  mupirocin  ointment (BACTROBAN ) 2 % Apply 1 Application topically daily as needed (wound care). 11/04/22  Yes [provider]  nitroGLYCERIN  (NITROSTAT ) 0.4 MG SL tablet Place 1 tablet (0.4 mg total) under the tongue every 5 (five) minutes as needed for chest pain. DISSOLVE 1 TABLET UNDER THE TONGUE EVERY 5 MINUTES FOR 3 DOSES 12/15/23  Yes Court Dorn PARAS, MD  omeprazole (PRILOSEC) 20 MG capsule Take 20 mg by mouth daily.    Yes [provider]  potassium chloride  20 MEQ TBCR Take 1 tablet (20 mEq total) by mouth daily. 03/05/24 06/03/24 Yes Fairy Frames, MD  predniSONE  (DELTASONE ) 10 MG tablet Take 2 tablets (20 mg total) by mouth daily with breakfast for 2 days, THEN 1 tablet (10 mg total) daily with breakfast for 2 days. 03/25/24 03/29/24 Yes Christobal Guadalajara, MD  pregabalin  (LYRICA ) 25 MG capsule Take by mouth in the morning and at bedtime. 03/08/23  Yes [provider]  STIOLTO RESPIMAT 2.5-2.5 MCG/ACT AERS Inhale 2 puffs into the lungs daily. 09/22/21  Yes [provider]  tamsulosin  (FLOMAX ) 0.4 MG CAPS capsule TAKE ONE CAPSULE BY MOUTH DAILY AFTER SUPPER Patient  taking differently: Take 0.4 mg by  mouth every evening. TAKE ONE CAPSULE BY MOUTH DAILY AFTER SUPPER 06/30/21  Yes Fargo, Amy E, NP  Respiratory Therapy Supplies (FLUTTER) DEVI Use as directed 03/16/17   Alaine Vicenta NOVAK, MD  Spacer/Aero-Holding Chambers (OPTICHAMBER DIAMOND ) MISC optichamber [facility] 09/12/19   Olalere, Jennet LABOR, MD    Scheduled Meds:  apixaban   5 mg Oral BID   arformoterol   15 mcg Nebulization BID   And   umeclidinium bromide   1 puff Inhalation Daily   carvedilol   3.125 mg Oral BID WC   Chlorhexidine  Gluconate Cloth  6 each Topical Daily   furosemide   40 mg Oral Daily   mexiletine  200 mg Oral Q12H   potassium chloride  SA  20 mEq Oral Daily   Continuous Infusions:  amiodarone  30 mg/hr (03/29/24 0343)   PRN Meds: acetaminophen  **OR** acetaminophen , levalbuterol , melatonin, ondansetron  (ZOFRAN ) IV, polyethylene glycol, senna  Allergies:   Allergies[1]  Social History:   Social History   Socioeconomic History   Marital status: Married    Spouse name: Not on file   Number of children: Not on file   Years of education: Not on file   Highest education level: Not on file  Occupational History   Occupation: Retired  Tobacco Use   Smoking status: Every Day    Current packs/day: 1.00    Average packs/day: 1 pack/day for 55.0 years (55.0 ttl pk-yrs)    Types: Cigarettes   Smokeless tobacco: Never   Tobacco comments:    off/on, always ready to quit but does not work out - still occasional smokes  Advertising Account Planner   Vaping status: Never Used  Substance and Sexual Activity   Alcohol  use: Yes    Comment: occasional   Drug use: No   Sexual activity: Not Currently  Other Topics Concern   Not on file  Social History Narrative   Not on file   Social Drivers of Health   Tobacco Use: High Risk (03/29/2024)   Patient History    Smoking Tobacco Use: Every Day    Smokeless Tobacco Use: Never    Passive Exposure: Not on file  Financial Resource Strain: Medium Risk (03/06/2021)   Overall Financial Resource  Strain (CARDIA)    Difficulty of Paying Living Expenses: Somewhat hard  Food Insecurity: No Food Insecurity (03/13/2024)   Epic    Worried About Programme Researcher, Broadcasting/film/video in the Last Year: Never true    Ran Out of Food in the Last Year: Never true  Transportation Needs: No Transportation Needs (03/27/2024)   [facility]   Epic    In the past 12 months, has lack of transportation kept you from medical appointments or from getting medications?: No    In the past 12 months, has lack of transportation kept you from meetings, work, or from getting things needed for daily living?: No  Physical Activity: Insufficiently Active (03/06/2021)   Exercise Vital Sign    Days of Exercise per Week: 4 days    Minutes of Exercise per Session: 20 min  Stress: No Stress Concern Present (12/27/2021)   Received from [facility], [facility] visits prior to 05/30/2022.   [employer] of Occupational Health - Occupational Stress Questionnaire    Feeling of Stress : Not at all  Social Connections: Moderately Isolated (03/13/2024)   Social Connection and Isolation Panel    Frequency of Communication with Friends and Family: More than three times a week  Frequency of Social Gatherings with Friends and Family: Never    Attends Religious Services: Never    Database Administrator or Organizations: No    Attends Banker Meetings: Never    Marital Status: Married  Catering Manager Violence: Not At Risk (03/26/2024)   Received from [facility]   HITS    Over the last 12 months how often did your partner physically hurt you?: Never    Over the last 12 months how often did your partner insult you or talk down to you?: Never    Over the last 12 months how often did your partner threaten you with physical harm?: Never    Over the last 12 months how often did your partner scream or curse at you?: Never  Depression (PHQ2-9): Low Risk (03/12/2022)   Depression (PHQ2-9)     PHQ-2 Score: 0  Alcohol  Screen: Low Risk (03/06/2021)   Alcohol  Screen    Last Alcohol  Screening Score (AUDIT): 1  Housing: Low Risk (03/13/2024)   Epic    Unable to Pay for Housing in the Last Year: No    Number of Times Moved in the Last Year: 0    Homeless in the Last Year: No  Utilities: Not At Risk (03/13/2024)   Epic    Threatened with loss of utilities: No  Health Literacy: Not on file    Family History:   Family History  Problem Relation Age of Onset   Heart attack Brother    CAD Father    Hypertension Father    CAD Mother    Hypertension Mother    Hypertension Brother    Stroke Neg Hx      ROS:  Please see the history of present illness.  All other ROS reviewed and negative.     Physical Exam/Data: Vitals:   03/29/24 0630 03/29/24 0715 03/29/24 0800 03/29/24 0811  BP: 110/69 111/76 110/88 (!) 140/75  Pulse: 71 65 87 89  Resp: 19 17 (!) 22 (!) 23  Temp:    97.7 F (36.5 C)  TempSrc:    Oral  SpO2: 99% 100% 90% 93%    Intake/Output Summary (Last 24 hours) at 03/29/2024 0852 Last data filed at 03/29/2024 0353 Gross per 24 hour  Intake 504.15 ml  Output 400 ml  Net 104.15 ml      03/24/2024    5:00 AM 03/23/2024    5:00 AM 03/22/2024    5:00 AM  Last 3 Weights  Weight (lbs) 144 lb 6.4 oz 144 lb 6.4 oz 143 lb 11.8 oz  Weight (kg) 65.5 kg 65.5 kg 65.2 kg     There is no height or weight on file to calculate BMI.  General:  appears thin, advanced atrophy, chronically ill, in no acute distress HEENT: normal Neck: no JVD Vascular: No carotid bruits; Distal pulses 2+ bilaterally Cardiac:  RRR; no murmurs, gallops or rubs Lungs:  some scattered rhonci, moving aie well, no wheezing, or rales  Abd: soft, nontender, no hepatomegaly  Ext: no edema Musculoskeletal:  No deformities, advacned atrophy, appears his age Skin: warm and dry  Neuro:  no focal abnormalities noted Psych:  Normal affect   EKG:  The EKG was personally reviewed and demonstrates:     #1 ST 119bpm, (barostim interference), PACs, no clear ischemic changes #2 SR 64bpm, PVC, PAC  Telemetry:  Telemetry was personally reviewed and demonstrates:   SR 80's, PACs, occ PVCs, infrequent couplet-3beat salvos  Relevant CV Studies:  TTE 03/04/24: IMPRESSIONS   1. Left ventricular ejection fraction, by estimation, is 35 to 40%. The  left ventricle has moderately decreased function. Left ventricular  endocardial border not optimally defined to evaluate regional wall motion.  Left ventricular diastolic function  could not be evaluated.   2. Right ventricular systolic function is mildly reduced. The right  ventricular size is normal.   3. Right atrial size was mildly dilated.   4. The mitral valve is normal in structure. Trivial mitral valve  regurgitation. No evidence of mitral stenosis.   5. The aortic valve is calcified. There is severe calcifcation of the  aortic valve. There is severe thickening of the aortic valve. Aortic valve  regurgitation is not visualized. Aortic valve sclerosis/calcification is  present, without any evidence of  aortic stenosis. Aortic valve Vmax measures 1.28 m/s.   6. Recommend limited study with definity  contrast to assess the LV apex  for thrombus and assess focal wall motion.   Echocardiogram 08/11/2023  1. Left ventricular ejection fraction, by estimation, is 30 to 35%. The  left ventricle has moderately decreased function. The left ventricle  demonstrates regional wall motion abnormalities (see scoring  diagram/findings for description). The left  ventricular internal cavity size was moderately dilated. Left ventricular  diastolic parameters are indeterminate. There is akinesis of the left  ventricular, basal-mid inferior wall, inferoseptal wall and inferolateral  wall. The average left ventricular  global longitudinal strain is -13.1 %. The global longitudinal strain is  abnormal.   2. Right ventricular systolic function is normal.  The right ventricular  size is mildly enlarged.   3. Left atrial size was mildly dilated.   4. Right atrial size was mild to moderately dilated.   5. The mitral valve is normal in structure. Mild mitral valve  regurgitation. No evidence of mitral stenosis.   6. The aortic valve is tricuspid. There is mild calcification of the  aortic valve. Aortic valve regurgitation is trivial. Aortic valve  sclerosis/calcification is present, without any evidence of aortic  stenosis.   7. Aortic dilatation noted. There is mild dilatation of the ascending  aorta, measuring 42 mm.     Cardiac catheterization 03/19/2022 2 vessel obstructive CAD - 100% occlusion of distal OM3 appears to be the culprit. The ostial PDA lesion is unchanged from 2014 Patent stents in the LAD and RCA Mild LV dysfunction with inferior wall motion abnormality Mildly elevated LVEDP 16 mm Hg  (Medically managed)  12/21/2014: EP/ablation Conclusion: Apparent successful catheter ablation of ventricular tachycardia with encircling lesions around the extensive scar on the left ventricular septum. Following ablation, there is no additional ventricular tachycardia.     Laboratory Data: High Sensitivity Troponin:   Recent Labs  Lab 03/03/24 1414 03/03/24 1632 03/12/24 0110 03/12/24 0310  TROPONINIHS 15 11 30* 11    Recent Labs  Lab 03/28/24 1553 03/28/24 2018 03/29/24 0444  TRNPT 26* 26* 28*      Chemistry Recent Labs  Lab 03/23/24 0234 03/28/24 1553 03/29/24 0444  NA 132* 132* 133*  K 4.2 4.1 3.2*  CL 92* 91* 93*  CO2 35* 30 33*  GLUCOSE 108* 99 115*  BUN 15 16 13   CREATININE 0.52* 0.69 0.57*  CALCIUM  8.3* 8.8* 8.4*  MG  --  2.1 2.1  GFRNONAA >60 >60 >60  ANIONGAP 5 10 7     Recent Labs  Lab 03/29/24 0444  PROT 5.8*  ALBUMIN 3.3*  AST 20  ALT 15  ALKPHOS 46  BILITOT 0.9  Lipids No results for input(s): CHOL, TRIG, HDL, LABVLDL, LDLCALC, CHOLHDL in the last 168 hours.   Hematology Recent Labs  Lab 03/23/24 0234 03/28/24 1553 03/29/24 0444  WBC 7.3 4.8 4.2  RBC 3.61* 4.15* 3.79*  HGB 10.7* 12.5* 11.2*  HCT 34.6* 40.8 37.6*  MCV 95.8 98.3 99.2  MCH 29.6 30.1 29.6  MCHC 30.9 30.6 29.8*  RDW 19.7* 20.9* 20.7*  PLT 239 175 146*   Thyroid   Recent Labs  Lab 03/29/24 0444  TSH 2.050    BNP Recent Labs  Lab 03/28/24 2018 03/29/24 0444  PROBNP 791.0* 810.0*    DDimer No results for input(s): DDIMER in the last 168 hours.  Radiology/Studies:  DG Chest Portable 1 View Result Date: 03/28/2024 CLINICAL DATA:  Pacemaker firing, preceded by chest pain. EXAM: PORTABLE CHEST 1 VIEW COMPARISON:  03/18/2024 FINDINGS: Normal-sized heart. Tortuous and partially calcified thoracic aorta. Mildly improved bilateral patchy density and mildly improved more confluence linear density in the left mid to lower lung zone. Otherwise, clear lungs with normal vascularity. No pleural fluid seen. Stable left subclavian pacer and AICD leads. Stable right neck neural stimulator lead. No acute bony abnormality. IMPRESSION: Mildly improved bilateral patchy atelectasis and/or pneumonia. Electronically Signed   By: Elspeth Bathe M.D.   On: 03/28/2024 17:33     Assessment and Plan: VT history Chronic amiodarone , mexiletine Long hx of VT Device has dual coil HV lead on advisory Impedance is stable  The patient just discharged from the hospital 12/26 for PNA Back to ER 12/28 after a fall He reports thatin these couple of days he did miss his meds for sure one day, probably two  I think these were inappropriate for PATs, his VT 1 zone starts at 110bpm, this is without a doubt, 2/2 known slow VT in the past  I will review EGMs with EP MD Will adjust ICD HV therapy outputs to max output, and any other adjustments felt indicated once able to review with EP MD  I think amiodarone  gtt today is still reasonable given PATs Would change his coreg  to Toprol  for tonights dose    ADDEND: Interrogation/EGMs reviewed with Dr. Kennyth Agreed, largely all are atrially driven with a few brief/NSVTs that were true (dual tachycardia) Med plan remains the same No plans to adjust therapy zones Will increase all HV therapies to max output   Paroxysmal AFib ATach CHA2DS2Vasc is 5, on Eliquis  low burden   CAD Low flat trops Denies CP  ICM Chronic CHF Volume stable     For questions or updates, please contact [facility] Please consult [email or web address] for contact info under    Signed, Charlies Macario Arthur, PA-C  03/29/2024 8:52 AM  I have seen, examined the patient, and reviewed the above assessment and plan.    HPI: Steven Guzman is an 83 year old male with past medical history notable for CAD c/b cardiac arrest (1995) s/p multiple PCIs (most recent LAD in 2011), NSTEMI (03/18/22), ICM (EF = 30%), VT s/p Bsci DC ICD (2010) s/p VT ablation (2016), Barostim (9/24), PAF (on Eliquis ), HTN, HLD, COPD.  He presented on 12/30 following ICD shock.  Of note, he has had multiple hospital admissions of late.  He was recently discharged and had been off of his medications for a few days at home.  Additionally, he was very stressed out over caring for his wife who is also ill.  He suspects these things led to his ICD shock.  He had otherwise been  feeling okay following his hospital discharge.  Currently, he has no new or acute complaints.  General: Frail elderly male, in no acute distress.  Neck: No JVD.  Cardiac: Normal rate, regular rhythm.  Resp: Normal work of breathing.  Ext: No edema.  Neuro: No gross focal deficits.  Psych: Normal affect.   Assessment: Steven Guzman is an 83 year old male with past medical history notable for ischemic cardiomyopathy, chronic systolic heart failure, ventricular tachycardia status post ICD who presented with ICD shock.  Interrogation of his device reveals some true NSVT but also frequent episodes of atrial driven  tachycardia.  His shock appears to be inappropriate based on EGM's.  Unfortunately, due to history of slower VT this will be difficult to program around.  Importantly, he was recently hospitalized with pneumonia and has been off some of his medications for the past few days, which may have contributed to his increase in arrhythmia.  No obvious ischemia or profound heart failure decompensation.  Problem List: # ICD shock # Atrial tachycardia/paroxysmal A-fib # Ventricular tachycardia # Chronic systolic heart failure secondary to ischemic cardiomyopathy  Plan:  - IV amiodarone  for 24 hours, then transition back to oral. - Continue home mexiletine. - Transition carvedilol  to metoprolol  XL given that we will be limited on uptitration of beta-blocker primarily due to blood pressure.  We will increase metoprolol  XL as tolerated. - Given that he has a [employer] lead, we will program his ICD therapies to max output.  Fonda Kitty, MD 03/29/2024 11:21 PM     [1]  Allergies Allergen Reactions   Sulfa Antibiotics Hives   Cephalexin  Itching   "

## 2024-03-29 NOTE — ED Notes (Signed)
 Pt accidentally pulled on IV, tape pulled up and caused a skin tear near the IV. IV dressing changed and skin tear cleaned and covered. IV secured with coban. Pt says that this feels much better.

## 2024-03-29 NOTE — Evaluation (Signed)
 Physical Therapy Evaluation Patient Details Name: Steven Guzman MRN: [id number] DOB: Apr 15, 1940 Today's Date: 03/29/2024  History of Present Illness  Pt is 83 year old presented to [facility] on  03/28/24 for chest pain and ICD firing. PMH - COPD, CHF, CAD MI, aortic aneurysm, ACDF,  chronic neck/back pain, anemia, depression, chronic idiopathic thrombocytopenia,  Clinical Impression  Pt known to me from previous admission. Continues to have generalized weakness, decr balance, and poor functional activity tolerance that prevented pt from successfully transitioning to home especially since his wife is unable to care for herself. Patient will benefit from continued inpatient follow up therapy, <3 hours/day.         If plan is discharge home, recommend the following: A little help with walking and/or transfers;A little help with bathing/dressing/bathroom;Assistance with cooking/housework;Assist for transportation;Help with stairs or ramp for entrance   Can travel by private vehicle   Yes    Equipment Recommendations None recommended by PT  Recommendations for Other Services       Functional Status Assessment Patient has had a recent decline in their functional status and demonstrates the ability to make significant improvements in function in a reasonable and predictable amount of time.     Precautions / Restrictions Precautions Precautions: Fall Recall of Precautions/Restrictions: Impaired Precaution/Restrictions Comments: watch O2, HR--on 3L at baseline Restrictions Weight Bearing Restrictions Per Provider Order: No      Mobility  Bed Mobility Overal bed mobility: Needs Assistance Bed Mobility: Supine to Sit, Sit to Supine     Supine to sit: Min assist, HOB elevated Sit to supine: HOB elevated, Contact guard assist   General bed mobility comments: Assist to elevate trunk into sitting    Transfers Overall transfer level: Needs assistance Equipment used: Rollator (4  wheels) Transfers: Sit to/from Stand Sit to Stand: Min assist           General transfer comment: Assist to power up from low surface    Ambulation/Gait             Pre-gait activities: Side stepped up side of bed General Gait Details: Pt declined amb due to fatigue  Stairs            Wheelchair Mobility     Tilt Bed    Modified Rankin (Stroke Patients Only)       Balance Overall balance assessment: Needs assistance Sitting-balance support: Feet supported Sitting balance-Leahy Scale: Good     Standing balance support: During functional activity Standing balance-Leahy Scale: Poor Standing balance comment: UE support                             Pertinent Vitals/Pain Pain Assessment Pain Assessment: No/denies pain    Home Living Family/patient expects to be discharged to:: Private residence Living Arrangements: Spouse/significant other (pt is caregiver of wife) Available Help at Discharge: Family Type of Home: Apartment Home Access: Level entry       Home Layout: One level Home Equipment: Agricultural Consultant (2 wheels);Rollator (4 wheels);Cane - single point      Prior Function Prior Level of Function : Needs assist             Mobility Comments: Pt using a rollator prior to dc home a few days ago, typically uses a cane but agrees it is not adequate ADLs Comments: States Mod I with ADL, light iADL, continues to drive.  Manages his own medications and does light meal prep prior  to previous hospitalization. Reports he was unable to complete when he discharged home last week.     Extremity/Trunk Assessment   Upper Extremity Assessment Upper Extremity Assessment: Defer to OT evaluation    Lower Extremity Assessment Lower Extremity Assessment: Generalized weakness    Cervical / Trunk Assessment Cervical / Trunk Assessment: Kyphotic;Other exceptions (weakness)  Communication   Communication Communication: Impaired Factors  Affecting Communication: Hearing impaired    Cognition Arousal: Alert Behavior During Therapy: Anxious   PT - Cognitive impairments: No apparent impairments                         Following commands: Intact       Cueing Cueing Techniques: Verbal cues     General Comments General comments (skin integrity, edema, etc.): Pt on 3L O2 with SpO2 94% and HR 70-80's    Exercises     Assessment/Plan    PT Assessment Patient needs continued PT services  PT Problem List Decreased strength;Decreased activity tolerance;Decreased balance;Decreased mobility       PT Treatment Interventions DME instruction;Gait training;Functional mobility training;Therapeutic activities;Therapeutic exercise;Balance training;Patient/family education    PT Goals (Current goals can be found in the Care Plan section)  Acute Rehab PT Goals Patient Stated Goal: go to rehab PT Goal Formulation: With patient Time For Goal Achievement: 04/12/24 Potential to Achieve Goals: Good    Frequency Min 2X/week     Co-evaluation               AM-PAC PT 6 Clicks Mobility  Outcome Measure Help needed turning from your back to your side while in a flat bed without using bedrails?: A Little Help needed moving from lying on your back to sitting on the side of a flat bed without using bedrails?: A Little Help needed moving to and from a bed to a chair (including a wheelchair)?: A Little Help needed standing up from a chair using your arms (e.g., wheelchair or bedside chair)?: A Little Help needed to walk in hospital room?: Total Help needed climbing 3-5 steps with a railing? : Total 6 Click Score: 14    End of Session Equipment Utilized During Treatment: Oxygen  Activity Tolerance: Patient limited by fatigue Patient left: in bed;with call bell/phone within reach;with bed alarm set Nurse Communication: Mobility status;Other (comment) (Pt wanted reassurance amiodarone  is working) PT Visit Diagnosis:  Unsteadiness on feet (R26.81);Other abnormalities of gait and mobility (R26.89);Muscle weakness (generalized) (M62.81);Difficulty in walking, not elsewhere classified (R26.2)    Time: 8689-8671 PT Time Calculation (min) (ACUTE ONLY): 18 min   Charges:   PT Evaluation $PT Eval Moderate Complexity: 1 Mod   PT General Charges $$ ACUTE PT VISIT: 1 Visit         [facility] PT Acute Rehabilitation Services Office [phone]   [place] ORN [facility] 03/29/2024, 2:24 PM

## 2024-03-29 NOTE — ED Notes (Signed)
 Floor notified patient coming up

## 2024-03-29 NOTE — Progress Notes (Signed)
 "  [facility] PROGRESS NOTE   Steven Guzman FMW:[id number] DOB: Sep 22, 1940 DOA: 03/28/2024  PCP: Esmeralda Morton SAUNDERS, PA-C  Brief History: 83 y.o. male with medical history significant for paroxysmal ventricular tachycardia, ICD placement 2010, VT ablation in 2016, paroxysmal atrial fibrillation chronically anticoagulated on Eliquis , CAD, chronic systolic heart failure in the setting of ischemic cardiomyopathy, COPD, chronic hypoxic respiratory failure on 3 L continuous nasal cannula at baseline, anemia of chronic disease associated baseline hemoglobin 10-12, who is admitted to [facility] on 03/28/2024 with paroxysmal ventricular tachycardia after presenting from home to [facility] ED complaining of ICD having fired.  Patient was recently hospitalized for pneumonia and discharged on 12/26.  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 Patient was hospitalized for further management.  Cardiology was consulted.  Consultants: Cardiology  Procedures: None yet    Subjective/Interval History: Denies any chest pain or shortness of breath this morning.  No nausea vomiting.    Assessment/Plan:  Ventricular tachycardia Patient has ICD in situ.  Apparently his ICD fired and he presented to the emergency department.  No further episodes in the hospital.  Patient was seen by cardiology and placed on amiodarone  infusion. Patient to be seen by electrophysiology today. Continue to monitor on telemetry.  Hypokalemia Will be aggressively supplemented.  Magnesium  is 2.1.  Generalized weakness No focal neurological deficits noted.  Recent hospitalization for pneumonia noted.  SNF or rehab was recommended at that time but he elected to go home.  PT OT to reevaluate.  Acute on chronic respiratory failure with hypoxia Uses oxygen  at home at 3 L/min.  Continue for now.  Saturations are stable.  Respiratory effort is  normal.  Paroxysmal atrial fibrillation Noted to be on Eliquis  which is being continued.  Noted to be on carvedilol  which was started this morning.  Chronic systolic CHF Based on echo from early December his LVEF is 35 to 40%.  RV function was noted to be mildly reduced.  Diastolic parameters could not be assessed. Noted to be on furosemide .  Seems to be well compensated at this time.  History of COPD Stable.  Anemia of chronic disease Stable hemoglobin noted.  No evidence for overt bleeding.  DVT Prophylaxis: Eliquis  Code Status: Full code Family Communication: Discussed with patient Disposition Plan: To be determined  Status is: Observation The patient remains OBS appropriate and may or may not d/c before 2 midnights.      Medications: Scheduled:  apixaban   5 mg Oral BID   arformoterol   15 mcg Nebulization BID   And   umeclidinium bromide   1 puff Inhalation Daily   carvedilol   3.125 mg Oral BID WC   Chlorhexidine  Gluconate Cloth  6 each Topical Daily   furosemide   40 mg Oral Daily   mexiletine  200 mg Oral Q12H   potassium chloride  SA  40 mEq Oral Q4H   Continuous:  amiodarone  30 mg/hr (03/29/24 0947)   PRN:acetaminophen  **OR** acetaminophen , levalbuterol , melatonin, ondansetron  (ZOFRAN ) IV, polyethylene glycol, senna  Objective:  Vital Signs  Vitals:   03/29/24 0715 03/29/24 0800 03/29/24 0811 03/29/24 0900  BP: 111/76 110/88 (!) 140/75 129/78  Pulse: 65 87 89 68  Resp: 17 (!) 22 (!) 23 20  Temp:   97.7 F (36.5 C)   TempSrc:   Oral   SpO2: 100% 90% 93% 98%    Intake/Output Summary (Last 24 hours) at 03/29/2024 0952 Last  data filed at 03/29/2024 0947 Gross per 24 hour  Intake 598.85 ml  Output 400 ml  Net 198.85 ml   There were no vitals filed for this visit.  General appearance: Awake alert.  In no distress Resp: Clear to auscultation bilaterally.  Normal effort Cardio: S1-S2 is normal regular.  No S3-S4.  No rubs murmurs or bruit GI: Abdomen  is soft.  Nontender nondistended.  Bowel sounds are present normal.  No masses organomegaly Extremities: No edema.  Full range of motion of lower extremities.  Physical deconditioning is noted. Neurologic: Alert and oriented x3.  No focal neurological deficits.    Lab Results:  Data Reviewed: I have personally reviewed following labs and reports of the imaging studies  CBC: Recent Labs  Lab 03/23/24 0234 03/28/24 1553 03/29/24 0444  WBC 7.3 4.8 4.2  NEUTROABS  --  3.5 2.5  HGB 10.7* 12.5* 11.2*  HCT 34.6* 40.8 37.6*  MCV 95.8 98.3 99.2  PLT 239 175 146*    Basic Metabolic Panel: Recent Labs  Lab 03/23/24 0234 03/28/24 1553 03/29/24 0444  NA 132* 132* 133*  K 4.2 4.1 3.2*  CL 92* 91* 93*  CO2 35* 30 33*  GLUCOSE 108* 99 115*  BUN 15 16 13   CREATININE 0.52* 0.69 0.57*  CALCIUM  8.3* 8.8* 8.4*  MG  --  2.1 2.1  PHOS  --   --  2.3*    GFR: Estimated Creatinine Clearance: 62.5 mL/min (A) (by C-G formula based on SCr of 0.57 mg/dL (L)).  Liver Function Tests: Recent Labs  Lab 03/29/24 0444  AST 20  ALT 15  ALKPHOS 46  BILITOT 0.9  PROT 5.8*  ALBUMIN 3.3*    BNP (last 3 results) Recent Labs    03/28/24 2018 03/29/24 0444  PROBNP 791.0* 810.0*   CBG: Recent Labs  Lab 03/23/24 1200 03/23/24 1725 03/23/24 2029 03/24/24 0800 03/24/24 1233  GLUCAP 145* 98 138* 101* 149*    Thyroid  Function Tests: Recent Labs    03/29/24 0444  TSH 2.050    Anemia Panel: Recent Labs    03/29/24 0625  VITAMINB12 >4,000*    Radiology Studies: DG Chest Portable 1 View Result Date: 03/28/2024 CLINICAL DATA:  Pacemaker firing, preceded by chest pain. EXAM: PORTABLE CHEST 1 VIEW COMPARISON:  03/18/2024 FINDINGS: Normal-sized heart. Tortuous and partially calcified thoracic aorta. Mildly improved bilateral patchy density and mildly improved more confluence linear density in the left mid to lower lung zone. Otherwise, clear lungs with normal vascularity. No pleural  fluid seen. Stable left subclavian pacer and AICD leads. Stable right neck neural stimulator lead. No acute bony abnormality. IMPRESSION: Mildly improved bilateral patchy atelectasis and/or pneumonia. Electronically Signed   By: Elspeth Bathe M.D.   On: 03/28/2024 17:33     LOS: 1 day   [employer]  [facility] Pager on [email or web address]  03/29/2024, 9:52 AM   "

## 2024-03-29 NOTE — ED Notes (Addendum)
 Attempted to draw labs off of IV. Unsuccessful. Phleb notified.

## 2024-03-29 NOTE — ED Notes (Signed)
"  Lab called to add on troponin.  "

## 2024-03-29 NOTE — Telephone Encounter (Signed)

## 2024-03-29 NOTE — NC FL2 (Signed)
 " [place]  MEDICAID FL2 LEVEL OF CARE FORM     IDENTIFICATION  Patient Name: Steven Guzman Birthdate: 09-06-1940 Sex: male Admission Date (Current Location): 03/28/2024  [facility] and [place] Number:  Producer, Television/film/video and Address:  [facility], [address], [place], [place] [address]      Provider Number: [id number]  Attending Physician Name and Address:  Verdene Purchase, MD  Relative Name and Phone Number:  Maximum, Reiland, Emergency Contact [phone] [facility]) ; Smith,Kendall (SIL) [phone] (mobile)    Current Level of Care: Hospital Recommended Level of Care: Skilled Nursing Facility Prior Approval Number:    Date Approved/Denied:   PASRR Number: [id number]  Discharge Plan: SNF    Current Diagnoses: Patient Active Problem List   Diagnosis Date Noted   Generalized weakness 03/29/2024   Acute on chronic hypoxic respiratory failure (HCC) 03/29/2024   History of COPD 03/29/2024   Protein-calorie malnutrition, severe 03/29/2024   Paroxysmal ventricular tachycardia (HCC) 03/28/2024   Paroxysmal atrial fibrillation (HCC) 03/28/2024   Pneumonia 03/12/2024   Prediabetes 03/12/2024   History of abdominal aortic aneurysm (AAA) 03/03/2024   Chronic idiopathic thrombocytopenia (HCC) 03/03/2024   Former smoker 03/03/2024   Bilateral sensorineural hearing loss 03/08/2023   Age-related vocal fold atrophy 08/03/2022   Inguinal hernia 04/09/2022   AAA (abdominal aortic aneurysm) 04/09/2022   NSTEMI (non-ST elevated myocardial infarction) (HCC) 03/18/2022   Closed displaced fracture of right femoral neck (HCC) 02/06/2022   Major depressive disorder with single episode, in partial remission 03/05/2020   Purpura 08/08/2019   Xerosis cutis 05/25/2019   Paraseptal emphysema (HCC) 12/15/2018   Tobacco abuse 12/15/2018   Left inguinal hernia 11/15/2018   Spondylolisthesis at L5-S1 level 10/14/2018   Senile osteoporosis 10/14/2018   Weight  loss 09/29/2018   Lumbar trigger point syndrome 04/20/2018   GERD (gastroesophageal reflux disease)    Esophageal stricture    Degenerative cervical spinal stenosis 10/27/2017   Carotid bruit 10/27/2017   B12 deficiency 08/25/2017   Anemia of chronic disease 06/10/2017   BPH (benign prostatic hyperplasia) 12/31/2016   Mass of throat 06/30/2015   Depression 05/24/2015   AF (atrial fibrillation) (HCC) 03/15/2015   Acute on chronic combined systolic and diastolic CHF (congestive heart failure) (HCC) 11/18/2014   Chronic systolic CHF (congestive heart failure) (HCC)    COPD GOLD GRADE C 12/25/2013   CAD (coronary artery disease) 11/23/2012   Smokers' cough (HCC) 11/23/2012   Essential hypertension 11/23/2012   Hyperlipidemia 11/23/2012   Atypical chest pain 11/23/2012   ICD (implantable cardioverter-defibrillator) in place 11/23/2012    Orientation RESPIRATION BLADDER Height & Weight     Self, Time, Situation, Place  O2 (Nasal Cannula 3 liters) Continent Weight: 134 lb 12.8 oz (61.1 kg) Height:  5' 11 (180.3 cm)  BEHAVIORAL SYMPTOMS/MOOD NEUROLOGICAL BOWEL NUTRITION STATUS      Continent Diet (See D/C Summary)  AMBULATORY STATUS COMMUNICATION OF NEEDS Skin   Limited Assist Verbally Other (Comment) (Abrasion,arm,Bil.,Ecchymosis,arm,Leg,Bil.,Wound/Incision LDAs,Wound PI sacrum stage 3,Wound traumatic elbow,post.R,Wound traumatic,head,upper,Wound PI buttocks,R,DTPI,Wound PI Buttocks,L ,stage 1,wound,other,arm.ant.R,upper)                       Personal Care Assistance Level of Assistance  Bathing, Feeding, Dressing Bathing Assistance: Limited assistance Feeding assistance: Independent Dressing Assistance: Limited assistance     Functional Limitations Info  Sight, Hearing, Speech Sight Info: Impaired (Eyeglasses) Hearing Info: Adequate Speech Info: Adequate    SPECIAL CARE FACTORS FREQUENCY  PT (  By licensed PT), OT (By licensed OT)     PT Frequency: 5x/week OT  Frequency: 5x/week            Contractures Contractures Info: Not present    Additional Factors Info  Psychotropic, Insulin  Sliding Scale, Code Status, Allergies Code Status Info: Full Allergies Info: Sulfa Antibiotics, Cephalexin  Psychotropic Info: escitalopram  (LEXAPRO ) tablet 20 mg daily,pregabalin  (LYRICA ) capsule 25 mg 2 times daily Insulin  Sliding Scale Info: Please see discharge summary       Current Medications (03/29/2024):  This is the current hospital active medication list Current Facility-Administered Medications  Medication Dose Route Frequency Provider Last Rate Last Admin   acetaminophen  (TYLENOL ) tablet 650 mg  650 mg Oral Q6H PRN Howerter, Justin B, DO       Or   acetaminophen  (TYLENOL ) suppository 650 mg  650 mg Rectal Q6H PRN Howerter, Justin B, DO       amiodarone  (NEXTERONE  PREMIX) 360-4.14 MG/200ML-% (1.8 mg/mL) IV infusion  30 mg/hr Intravenous Continuous Floretta Mallard, MD 16.67 mL/hr at 03/29/24 1600 30 mg/hr at 03/29/24 1600   apixaban  (ELIQUIS ) tablet 5 mg  5 mg Oral BID Floretta Mallard, MD   5 mg at 03/29/24 1022   arformoterol  (BROVANA ) nebulizer solution 15 mcg  15 mcg Nebulization BID Howerter, Justin B, DO   15 mcg at 03/29/24 9273   And   umeclidinium bromide  (INCRUSE ELLIPTA ) 62.5 MCG/ACT 1 puff  1 puff Inhalation Daily Howerter, Justin B, DO       collagenase (SANTYL) ointment   Topical Daily Krishnan, Gokul, MD       escitalopram  (LEXAPRO ) tablet 20 mg  20 mg Oral Daily Krishnan, Gokul, MD   20 mg at 03/29/24 1534   feeding supplement (ENSURE PLUS HIGH PROTEIN) liquid 237 mL  237 mL Oral TID BM Krishnan, Gokul, MD   237 mL at 03/29/24 1524   furosemide  (LASIX ) tablet 40 mg  40 mg Oral Daily Floretta Mallard, MD   40 mg at 03/29/24 1022   levalbuterol  (XOPENEX ) nebulizer solution 1.25 mg  1.25 mg Nebulization Q4H PRN Howerter, Justin B, DO       melatonin tablet 3 mg  3 mg Oral QHS PRN Howerter, Justin B, DO       metoprolol  succinate  (TOPROL -XL) 24 hr tablet 50 mg  50 mg Oral QHS Ursuy, Renee Lynn, PA-C       mexiletine (MEXITIL ) capsule 200 mg  200 mg Oral Q12H Floretta Mallard, MD   200 mg at 03/29/24 1021   multivitamin with minerals tablet 1 tablet  1 tablet Oral Daily Krishnan, Gokul, MD   1 tablet at 03/29/24 1524   nutrition supplement (JUVEN) (JUVEN) powder packet 1 packet  1 packet Oral BID BM Krishnan, Gokul, MD   1 packet at 03/29/24 1524   ondansetron  (ZOFRAN ) injection 4 mg  4 mg Intravenous Q6H PRN Howerter, Justin B, DO       polyethylene glycol (MIRALAX  / GLYCOLAX ) packet 17 g  17 g Oral Daily PRN Marshall, Jessica, DO       potassium chloride  SA (KLOR-CON  M) CR tablet 40 mEq  40 mEq Oral Q4H Krishnan, Gokul, MD   40 mEq at 03/29/24 1523   pregabalin  (LYRICA ) capsule 25 mg  25 mg Oral BID Krishnan, Gokul, MD       senna [facility]) tablet 8.6 mg  1 tablet Oral BID PRN Marshall, Jessica, DO       tamsulosin  (FLOMAX ) capsule 0.4 mg  0.4 mg  Oral Daily Krishnan, Gokul, MD   0.4 mg at 03/29/24 1523   thiamine (VITAMIN B1) tablet 100 mg  100 mg Oral Daily Krishnan, Gokul, MD   100 mg at 03/29/24 1523     Discharge Medications: Please see discharge summary for a list of discharge medications.  Relevant Imaging Results:  Relevant Lab Results:   Additional Information SSN [id number]  Isaiah Public, LCSWA     "

## 2024-03-29 NOTE — TOC Initial Note (Addendum)
 Transition of Care [facility]) - Initial/Assessment Note    Patient Details  Name: Steven Guzman MRN: [id number] Date of Birth: [birth year]  Transition of Care [facility]) CM/SW Contact:    Steven Guzman, LCSWA Phone Number: 03/29/2024, 5:00 PM  Clinical Narrative:                  CSW received consult for possible SNF placement at time of discharge. CSW spoke with patient at bedside regarding PT recommendation of SNF placement at time of discharge. Patient expressed understanding of PT recommendation and is agreeable to SNF placement at time of discharge. Patient reports he lives at home with spouse who is currently also in the hospital.Patient reports preference for [facility] in [facility] in [place] . CSW discussed insurance authorization process. Patient expressed being hopeful for rehab and to feel better soon. No further questions reported at this time. CSW spoke with admissions with [facility] who provided fax number to send referral over too. Fax# [phone].Steven Guzman is contact for admissions at [email or web address] to continue to follow and assist with discharge planning needs.   Expected Discharge Plan: Skilled Nursing Facility Barriers to Discharge: English As A Second Language Teacher, Continued Medical Work up, No SNF bed   Patient Goals and CMS Choice Patient states their goals for this hospitalization and ongoing recovery are:: Achieve SNF placement, complete rehab, return home with wife CMS Medicare.gov Compare Post Acute Care list provided to:: Patient Choice offered to / list presented to : Patient [place] ownership interest in [facility].provided to:: Patient    Expected Discharge Plan and Services In-house Referral: Clinical Social Work   Post Acute Care Choice: Skilled Nursing Facility Living arrangements for the past 2 months: Single Family Home                                      Prior Living Arrangements/Services Living arrangements for the  past 2 months: Single Family Home Lives with:: Spouse Patient language and need for interpreter reviewed:: Yes Do you feel safe going back to the place where you live?: Yes      Need for Family Participation in Patient Care: Yes (Comment) Care giver support system in place?: No (comment) Current home services: DME, Home PT, Home OT Criminal Activity/Legal Involvement Pertinent to Current Situation/Hospitalization: No - Comment as needed  Activities of Daily Living   ADL Screening (condition at time of admission) Independently performs ADLs?: No Does the patient have a NEW difficulty with bathing/dressing/toileting/self-feeding that is expected to last >3 days?: Yes (Initiates electronic notice to provider for possible OT consult) Does the patient have a NEW difficulty with getting in/out of bed, walking, or climbing stairs that is expected to last >3 days?: Yes (Initiates electronic notice to provider for possible PT consult) Does the patient have a NEW difficulty with communication that is expected to last >3 days?: No Is the patient deaf or have difficulty hearing?: No Does the patient have difficulty seeing, even when wearing glasses/contacts?: No Does the patient have difficulty concentrating, remembering, or making decisions?: No  Permission Sought/Granted                  Emotional Assessment Appearance:: Appears stated age Attitude/Demeanor/Rapport: Gracious Affect (typically observed): Appropriate Orientation: : Oriented to Self, Oriented to Place, Oriented to  Time, Oriented to Situation Alcohol  / Substance Use: Not Applicable Psych Involvement: No (comment)  Admission diagnosis:  Paroxysmal  ventricular tachycardia (HCC) [I47.20] V-tach (HCC) [I47.20] Physical deconditioning [R53.81] New onset a-fib (HCC) [I48.91] Failure to thrive in adult [R62.7] Chest pain, unspecified type [R07.9] Patient Active Problem List   Diagnosis Date Noted   Generalized weakness  03/29/2024   Acute on chronic hypoxic respiratory failure (HCC) 03/29/2024   History of COPD 03/29/2024   Protein-calorie malnutrition, severe 03/29/2024   Paroxysmal ventricular tachycardia (HCC) 03/28/2024   Paroxysmal atrial fibrillation (HCC) 03/28/2024   Pneumonia 03/12/2024   Prediabetes 03/12/2024   History of abdominal aortic aneurysm (AAA) 03/03/2024   Chronic idiopathic thrombocytopenia (HCC) 03/03/2024   Former smoker 03/03/2024   Bilateral sensorineural hearing loss 03/08/2023   Age-related vocal fold atrophy 08/03/2022   Inguinal hernia 04/09/2022   AAA (abdominal aortic aneurysm) 04/09/2022   NSTEMI (non-ST elevated myocardial infarction) (HCC) 03/18/2022   Closed displaced fracture of right femoral neck (HCC) 02/06/2022   Major depressive disorder with single episode, in partial remission 03/05/2020   Purpura 08/08/2019   Xerosis cutis 05/25/2019   Paraseptal emphysema (HCC) 12/15/2018   Tobacco abuse 12/15/2018   Left inguinal hernia 11/15/2018   Spondylolisthesis at L5-S1 level 10/14/2018   Senile osteoporosis 10/14/2018   Weight loss 09/29/2018   Lumbar trigger point syndrome 04/20/2018   GERD (gastroesophageal reflux disease)    Esophageal stricture    Degenerative cervical spinal stenosis 10/27/2017   Carotid bruit 10/27/2017   B12 deficiency 08/25/2017   Anemia of chronic disease 06/10/2017   BPH (benign prostatic hyperplasia) 12/31/2016   Mass of throat 06/30/2015   Depression 05/24/2015   AF (atrial fibrillation) (HCC) 03/15/2015   Acute on chronic combined systolic and diastolic CHF (congestive heart failure) (HCC) 11/18/2014   Chronic systolic CHF (congestive heart failure) (HCC)    COPD GOLD GRADE C 12/25/2013   CAD (coronary artery disease) 11/23/2012   Smokers' cough (HCC) 11/23/2012   Essential hypertension 11/23/2012   Hyperlipidemia 11/23/2012   Atypical chest pain 11/23/2012   ICD (implantable cardioverter-defibrillator) in place 11/23/2012    PCP:  Esmeralda Morton SAUNDERS, PA-C Pharmacy:   [facility] [id number] - [place], [place] - [address] [place] [address] Phone: [phone] Fax: [phone]  [facility] [address] [place] [address] Phone: [phone] Fax: [phone]     Social Drivers of Health (SDOH) Social History: SDOH Screenings   Food Insecurity: No Food Insecurity (03/29/2024)  Housing: Low Risk (03/29/2024)  Transportation Needs: No Transportation Needs (03/29/2024)  Utilities: Not At Risk (03/29/2024)  Alcohol  Screen: Low Risk (03/06/2021)  Depression (PHQ2-9): Low Risk (03/12/2022)  Financial Resource Strain: Medium Risk (03/06/2021)  Physical Activity: Insufficiently Active (03/06/2021)  Social Connections: Socially Integrated (03/29/2024)  Recent Concern: Social Connections - Moderately Isolated (03/13/2024)  Stress: No Stress Concern Present (12/27/2021)   Received from [facility] visits prior to 05/30/2022.  Tobacco Use: High Risk (03/29/2024)   SDOH Interventions:     Readmission Risk Interventions    03/14/2024    4:52 PM  Readmission Risk Prevention Plan  Transportation Screening Complete  Medication Review (RN Care Manager) Complete  HRI or Home Care Consult Complete  Palliative Care Screening Not Applicable  Skilled Nursing Facility Not Applicable

## 2024-03-29 NOTE — Progress Notes (Addendum)
 Pts wife Steven Guzman) currently in ED; both pt and wife have been recommended for SNF and prefer placement together at the same facility. CSW spoke with Donnamarie at [facility], who confirmed the facility can accommodate pt and wife in the same room. Once PT completes evaluation, SNF referral will be faxed. Donnamarie is expecting the referral.   Please coordinate w/ ED SW to get pt and wife to same facility if possible. Pt wife will likely dc before pt.

## 2024-03-29 NOTE — ED Notes (Signed)
MD contacted for diet order.

## 2024-03-29 NOTE — Evaluation (Signed)
 Occupational Therapy Evaluation Patient Details Name: Steven Guzman MRN: [id number] DOB: May 23, 1940 Today's Date: 03/29/2024   History of Present Illness   Pt is 83 year old presented to [facility] on  03/28/24 for chest pain and ICD firing. PMH - COPD, CHF, CAD MI, aortic aneurysm, ACDF,  chronic neck/back pain, anemia, depression, chronic idiopathic thrombocytopenia,     Clinical Impressions Prior to pt's admission earlier this month, he was walking with a cane, mod I in self care and IADLs and assisting his wife. Pt went home at a min assist level and had been using a rollator. Presents with generalized weakness, poor standing balance and decreased activity tolerance. He needs set up to mod assist for ADLs, min to mod assist for bed mobility and min assist with RW for OOB. Pt with SpO2 92% on 3L and HR to 134 with minimal exertion. Patient will benefit from continued inpatient follow up therapy, <3 hours/day.     If plan is discharge home, recommend the following:   A little help with walking and/or transfers;A lot of help with bathing/dressing/bathroom;Assistance with cooking/housework;Assist for transportation;Help with stairs or ramp for entrance     Functional Status Assessment   Patient has had a recent decline in their functional status and demonstrates the ability to make significant improvements in function in a reasonable and predictable amount of time.     Equipment Recommendations   None recommended by OT     Recommendations for Other Services         Precautions/Restrictions   Precautions Precautions: Fall Precaution/Restrictions Comments: watch O2, HR--on 3L at baseline Restrictions Weight Bearing Restrictions Per Provider Order: No     Mobility Bed Mobility Overal bed mobility: Needs Assistance Bed Mobility: Supine to Sit, Sit to Supine     Supine to sit: Min assist, HOB elevated Sit to supine: Mod assist, HOB elevated   General bed mobility  comments: assist to raise trunk and for LEs back onto stretcher    Transfers Overall transfer level: Needs assistance Equipment used: Rolling walker (2 wheels) Transfers: Sit to/from Stand Sit to Stand: Contact guard assist, From elevated surface           General transfer comment: stabilizing back of legs on stretcher in standing, side stepping with min assist      Balance Overall balance assessment: Needs assistance   Sitting balance-Leahy Scale: Good     Standing balance support: During functional activity Standing balance-Leahy Scale: Poor Standing balance comment: stabilizing LEs on stretcher while using urinal                           ADL either performed or assessed with clinical judgement   ADL Overall ADL's : Needs assistance/impaired Eating/Feeding: Set up;Bed level Eating/Feeding Details (indicate cue type and reason): coffee Grooming: Wash/dry hands;Sitting;Set up   Upper Body Bathing: Minimal assistance;Sitting   Lower Body Bathing: Moderate assistance;Sit to/from stand   Upper Body Dressing : Set up;Sitting   Lower Body Dressing: Moderate assistance;Sit to/from stand   Toilet Transfer: Minimal assistance;Rolling walker (2 wheels) Toilet Transfer Details (indicate cue type and reason): stood to use urinal Toileting- Clothing Manipulation and Hygiene: Minimal assistance;Sit to/from stand Toileting - Clothing Manipulation Details (indicate cue type and reason): assist for balance as pt managed pants     Functional mobility during ADLs: Minimal assistance;Rolling walker (2 wheels)       Vision Ability to See in Adequate Light: 0 Adequate  Patient Visual Report: No change from baseline       Perception         Praxis         Pertinent Vitals/Pain Pain Assessment Pain Assessment: Faces Pain Score: 2  Pain Location: R IV site Pain Descriptors / Indicators: Guarding, Grimacing Pain Intervention(s): Monitored during session,  Repositioned     Extremity/Trunk Assessment Upper Extremity Assessment Upper Extremity Assessment: Generalized weakness;Right hand dominant   Lower Extremity Assessment Lower Extremity Assessment: Defer to PT evaluation   Cervical / Trunk Assessment Cervical / Trunk Assessment: Kyphotic;Other exceptions (weakness)   Communication Communication Communication: Impaired Factors Affecting Communication: Hearing impaired   Cognition Arousal: Alert Behavior During Therapy: WFL for tasks assessed/performed Cognition: No apparent impairments             OT - Cognition Comments: pt is aware he and his wife cannot manage at home                 Following commands: Intact       Cueing  General Comments   Cueing Techniques: Verbal cues      Exercises     Shoulder Instructions      Home Living Family/patient expects to be discharged to:: Private residence Living Arrangements: Spouse/significant other (pt is caregiver of wife) Available Help at Discharge: Family Type of Home: Apartment Home Access: Level entry     Home Layout: One level     Bathroom Shower/Tub: Chief Strategy Officer: Handicapped height Bathroom Accessibility: Yes   Home Equipment: Agricultural Consultant (2 wheels);Rollator (4 wheels);Cane - single point          Prior Functioning/Environment Prior Level of Function : Needs assist             Mobility Comments: Pt using a rollator prior to dc home a few days ago, typically uses a cane but agrees it is not adequate ADLs Comments: States Mod I with ADL, light iADL, continues to drive.  Manages his own medications and does light meal prep prior to previous hospitalization. Reports he was unable to complete when he discharged home last week.    OT Problem List: Decreased strength;Decreased activity tolerance;Impaired balance (sitting and/or standing);Cardiopulmonary status limiting activity   OT Treatment/Interventions:  Self-care/ADL training;Therapeutic activities;Patient/family education;Energy conservation;DME and/or AE instruction;Balance training      OT Goals(Current goals can be found in the care plan section)   Acute Rehab OT Goals OT Goal Formulation: With patient Time For Goal Achievement: 04/12/24 Potential to Achieve Goals: Good ADL Goals Pt Will Perform Grooming: with supervision;standing Pt Will Perform Lower Body Bathing: with supervision;sit to/from stand Pt Will Perform Lower Body Dressing: sit to/from stand;with supervision Pt Will Transfer to Toilet: with supervision;ambulating;bedside commode Pt Will Perform Toileting - Clothing Manipulation and hygiene: with supervision;sit to/from stand Additional ADL Goal #1: Pt will complete bed mobility mod I. Additional ADL Goal #2: Pt will implement energy conservation and pursed lip breathing in ADLs and mobility.   OT Frequency:  Min 2X/week    Co-evaluation              AM-PAC OT 6 Clicks Daily Activity     Outcome Measure Help from another person eating meals?: None Help from another person taking care of personal grooming?: A Little Help from another person toileting, which includes using toliet, bedpan, or urinal?: A Little Help from another person bathing (including washing, rinsing, drying)?: A Lot Help from another person to put on  and taking off regular upper body clothing?: A Little Help from another person to put on and taking off regular lower body clothing?: A Lot 6 Click Score: 17   End of Session Equipment Utilized During Treatment: Gait belt;Rolling walker (2 wheels);Oxygen  (3L)  Activity Tolerance: Patient limited by fatigue Patient left: in bed;with call bell/phone within reach  OT Visit Diagnosis: Unsteadiness on feet (R26.81);Muscle weakness (generalized) (M62.81);Other (comment) (decreased acti9vity tolerance)                Time: 9074-9054 OT Time Calculation (min): 20 min Charges:  OT General  Charges $OT Visit: 1 Visit OT Evaluation $OT Eval Moderate Complexity: 1 Mod  Mliss HERO, OTR/L Acute Rehabilitation Services Office: [phone]   Kennth Mliss Helling 03/29/2024, 9:59 AM

## 2024-03-29 NOTE — Progress Notes (Signed)
 Received update that the patient does NOT want to go to the same SNF. Met with patient at bedside. After thinking and talking it over, he wants to focus on his own rehab. Feels that if they go together, he will be too focused on her wellbeing and not on his own. States he tried that last time while forgoing SNF to return home and care for her and it did not work. CSW offered to place in same facility but in different rooms, he declined. Spouse does not yet know. Discussed with patient that he should be the one to tell her. They both have their phones with them and have been talking frequently. Support provided.

## 2024-03-29 NOTE — Discharge Instructions (Signed)
 Notes from in-room conversation with Registered Dietitian 03/29/24    - Boost VHC or equivalent oral supplement with as high calories (over 500) per carton as possible. Drink more than 3 a day if able and try putting one in your daily smoothie. - Increase protein intake and ensure there is a good source of protein at every meal or snack. Add eggs to your breakfast and a meat to your lunch or soup. - 1 packet Juven twice daily to support wound healing. - Multivitamin once daily. - Choose softer meats or vegetables while you are missing your teeth/plate.

## 2024-03-30 DIAGNOSIS — I472 Ventricular tachycardia, unspecified: Secondary | ICD-10-CM | POA: Diagnosis not present

## 2024-03-30 DIAGNOSIS — I480 Paroxysmal atrial fibrillation: Secondary | ICD-10-CM | POA: Diagnosis not present

## 2024-03-30 LAB — BASIC METABOLIC PANEL WITH GFR
Anion gap: 5 (ref 5–15)
BUN: 18 mg/dL (ref 8–23)
CO2: 33 mmol/L — ABNORMAL HIGH (ref 22–32)
Calcium: 8.5 mg/dL — ABNORMAL LOW (ref 8.9–10.3)
Chloride: 95 mmol/L — ABNORMAL LOW (ref 98–111)
Creatinine, Ser: 0.5 mg/dL — ABNORMAL LOW (ref 0.61–1.24)
GFR, Estimated: >60 mL/min
Glucose, Bld: 84 mg/dL (ref 70–99)
Potassium: 4.3 mmol/L (ref 3.5–5.1)
Sodium: 133 mmol/L — ABNORMAL LOW (ref 135–145)

## 2024-03-30 LAB — MAGNESIUM: Magnesium: 1.8 mg/dL (ref 1.7–2.4)

## 2024-03-30 MED ORDER — AMIODARONE HCL 200 MG PO TABS
200.0000 mg | ORAL_TABLET | Freq: Every day | ORAL | Status: DC
  Administered 2024-03-30 – 2024-04-07 (×9): 200 mg via ORAL
  Filled 2024-03-30 (×9): qty 1

## 2024-03-30 MED ORDER — MAGNESIUM SULFATE 4 GM/100ML IV SOLN
4.0000 g | Freq: Once | INTRAVENOUS | Status: AC
  Administered 2024-03-30: 4 g via INTRAVENOUS
  Filled 2024-03-30: qty 100

## 2024-03-30 MED ORDER — EMPAGLIFLOZIN 10 MG PO TABS
10.0000 mg | ORAL_TABLET | Freq: Every day | ORAL | Status: DC
  Administered 2024-03-30 – 2024-04-01 (×3): 10 mg via ORAL
  Filled 2024-03-30 (×3): qty 1

## 2024-03-30 MED ORDER — POLYETHYLENE GLYCOL 3350 17 G PO PACK
17.0000 g | PACK | Freq: Every day | ORAL | Status: DC
  Administered 2024-03-31 – 2024-04-01 (×2): 17 g via ORAL
  Filled 2024-03-30 (×2): qty 1

## 2024-03-30 MED ORDER — BISACODYL 10 MG RE SUPP
10.0000 mg | Freq: Once | RECTAL | Status: AC
  Administered 2024-03-30: 10 mg via RECTAL
  Filled 2024-03-30: qty 1

## 2024-03-30 MED ORDER — SENNOSIDES-DOCUSATE SODIUM 8.6-50 MG PO TABS
2.0000 | ORAL_TABLET | Freq: Two times a day (BID) | ORAL | Status: DC
  Administered 2024-03-30 – 2024-04-07 (×16): 2 via ORAL
  Filled 2024-03-30 (×16): qty 2

## 2024-03-30 NOTE — Progress Notes (Signed)
 "  [facility] PROGRESS NOTE   Steven Guzman FMW:[id number] DOB: 1940/12/30 DOA: 03/28/2024  PCP: Esmeralda Morton SAUNDERS, PA-C  Brief History: 84 y.o. male with medical history significant for paroxysmal ventricular tachycardia, ICD placement 2010, VT ablation in 2016, paroxysmal atrial fibrillation chronically anticoagulated on Eliquis , CAD, chronic systolic heart failure in the setting of ischemic cardiomyopathy, COPD, chronic hypoxic respiratory failure on 3 L continuous nasal cannula at baseline, anemia of chronic disease associated baseline hemoglobin 10-12, who is admitted to [facility] on 03/28/2024 with paroxysmal ventricular tachycardia after presenting from home to [facility] ED complaining of ICD having fired.  Patient was recently hospitalized for pneumonia and discharged on 12/26.  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 Patient was hospitalized for further management.  Cardiology was consulted.  Consultants: Cardiology  Procedures: None yet    Subjective/Interval History: Patient denies any chest pain or shortness of breath.  No nausea vomiting.     Assessment/Plan:  Ventricular tachycardia Patient has ICD in situ.  Apparently his ICD fired and he presented to the emergency department.  No further episodes in the hospital.  Patient was seen by cardiology and placed on amiodarone  infusion. Patient was seen by electrophysiology.  Further management deferred to them. Continues to be on amiodarone  infusion.  Hypokalemia Improved with supplementation.  Magnesium  is 1.8 and will be supplemented.    Generalized weakness No focal neurological deficits noted.  Recent hospitalization for pneumonia noted.  SNF or rehab was recommended at that time but he elected to go home.  Seen by PT and OT.  Inpatient rehab was recommended.  Patient agrees to go to his SNF this time around.  Acute on chronic respiratory  failure with hypoxia Uses oxygen  at home at 3 L/min.  Continue for now.  Saturations are stable.  Respiratory effort is normal.  Paroxysmal atrial fibrillation Stable.  Noted to be on apixaban .  Changed over from carvedilol  to metoprolol .    Chronic systolic CHF Based on echo from early December his LVEF is 35 to 40%.  RV function was noted to be mildly reduced.  Diastolic parameters could not be assessed. Noted to be on furosemide .  Seems to be well compensated at this time.  History of COPD Stable.  Anemia of chronic disease Stable hemoglobin noted.  No evidence for overt bleeding.  DVT Prophylaxis: Eliquis  Code Status: Full code Family Communication: Discussed with patient Disposition Plan: SNF when medically stable    Medications: Scheduled:  apixaban   5 mg Oral BID   arformoterol   15 mcg Nebulization BID   And   umeclidinium bromide   1 puff Inhalation Daily   collagenase   Topical Daily   escitalopram   20 mg Oral Daily   feeding supplement  237 mL Oral TID BM   furosemide   40 mg Oral Daily   metoprolol  succinate  50 mg Oral QHS   mexiletine  200 mg Oral Q12H   multivitamin with minerals  1 tablet Oral Daily   nutrition supplement (JUVEN)  1 packet Oral BID BM   pregabalin   25 mg Oral BID   tamsulosin   0.4 mg Oral Daily   thiamine  100 mg Oral Daily   Continuous:  amiodarone  30 mg/hr (03/30/24 0239)   magnesium  sulfate bolus IVPB     PRN:acetaminophen  **OR** acetaminophen , levalbuterol , melatonin, ondansetron  (ZOFRAN ) IV, polyethylene glycol, senna  Objective:  Vital Signs  Vitals:  03/30/24 0454 03/30/24 0455 03/30/24 0737 03/30/24 0826  BP: 102/64   98/65  Pulse: 63   63  Resp: 18   15  Temp: 97.8 F (36.6 C)   97.7 F (36.5 C)  TempSrc: Oral   Oral  SpO2: 100%  100% 100%  Weight:  62.6 kg    Height:        Intake/Output Summary (Last 24 hours) at 03/30/2024 0950 Last data filed at 03/30/2024 0454 Gross per 24 hour  Intake 926.09 ml  Output 1200  ml  Net -273.91 ml   Filed Weights   03/29/24 1127 03/30/24 0455  Weight: 61.1 kg 62.6 kg    General appearance: Awake alert.  In no distress Resp: Clear to auscultation bilaterally.  Normal effort Cardio: S1-S2 is normal regular.  No S3-S4.  No rubs murmurs or bruit GI: Abdomen is soft.  Nontender nondistended.  Bowel sounds are present normal.  No masses organomegaly Extremities: No edema.  Full range of motion of lower extremities. Neurologic: Alert and oriented x3.  No focal neurological deficits.    Lab Results:  Data Reviewed: I have personally reviewed following labs and reports of the imaging studies  CBC: Recent Labs  Lab 03/28/24 1553 03/29/24 0444  WBC 4.8 4.2  NEUTROABS 3.5 2.5  HGB 12.5* 11.2*  HCT 40.8 37.6*  MCV 98.3 99.2  PLT 175 146*    Basic Metabolic Panel: Recent Labs  Lab 03/28/24 1553 03/29/24 0444 03/30/24 0632  NA 132* 133* 133*  K 4.1 3.2* 4.3  CL 91* 93* 95*  CO2 30 33* 33*  GLUCOSE 99 115* 84  BUN 16 13 18   CREATININE 0.69 0.57* 0.50*  CALCIUM  8.8* 8.4* 8.5*  MG 2.1 2.1 1.8  PHOS  --  2.3*  --     GFR: Estimated Creatinine Clearance: 59.8 mL/min (A) (by C-G formula based on SCr of 0.5 mg/dL (L)).  Liver Function Tests: Recent Labs  Lab 03/29/24 0444  AST 20  ALT 15  ALKPHOS 46  BILITOT 0.9  PROT 5.8*  ALBUMIN 3.3*    BNP (last 3 results) Recent Labs    03/28/24 2018 03/29/24 0444  PROBNP 791.0* 810.0*   CBG: Recent Labs  Lab 03/23/24 1200 03/23/24 1725 03/23/24 2029 03/24/24 0800 03/24/24 1233  GLUCAP 145* 98 138* 101* 149*    Thyroid  Function Tests: Recent Labs    03/29/24 0444  TSH 2.050    Anemia Panel: Recent Labs    03/29/24 0625  VITAMINB12 >4,000*    Radiology Studies: DG Chest Portable 1 View Result Date: 03/28/2024 CLINICAL DATA:  Pacemaker firing, preceded by chest pain. EXAM: PORTABLE CHEST 1 VIEW COMPARISON:  03/18/2024 FINDINGS: Normal-sized heart. Tortuous and partially  calcified thoracic aorta. Mildly improved bilateral patchy density and mildly improved more confluence linear density in the left mid to lower lung zone. Otherwise, clear lungs with normal vascularity. No pleural fluid seen. Stable left subclavian pacer and AICD leads. Stable right neck neural stimulator lead. No acute bony abnormality. IMPRESSION: Mildly improved bilateral patchy atelectasis and/or pneumonia. Electronically Signed   By: Elspeth Bathe M.D.   On: 03/28/2024 17:33     LOS: 2 days   Yeva Bissette Verdene  [facility] Pager on [email or web address]  03/30/2024, 9:50 AM   "

## 2024-03-30 NOTE — Plan of Care (Signed)

## 2024-03-30 NOTE — Progress Notes (Signed)
 "  Rounding Note   Patient Name: Steven Guzman Date of Encounter: 03/30/2024  [facility] Cardiologist: Jerel Balding, MD   Subjective  Pt denies CP  Breathing is OK   Admits to missing meds for a couple days    Fell asleep almost in mid sentence   Awake talking then asleep   Scheduled Meds:  apixaban   5 mg Oral BID   arformoterol   15 mcg Nebulization BID   And   umeclidinium bromide   1 puff Inhalation Daily   collagenase   Topical Daily   escitalopram   20 mg Oral Daily   feeding supplement  237 mL Oral TID BM   furosemide   40 mg Oral Daily   metoprolol  succinate  50 mg Oral QHS   mexiletine  200 mg Oral Q12H   multivitamin with minerals  1 tablet Oral Daily   nutrition supplement (JUVEN)  1 packet Oral BID BM   pregabalin   25 mg Oral BID   tamsulosin   0.4 mg Oral Daily   thiamine  100 mg Oral Daily   Continuous Infusions:  amiodarone  30 mg/hr (03/30/24 0239)   PRN Meds: acetaminophen  **OR** acetaminophen , levalbuterol , melatonin, ondansetron  (ZOFRAN ) IV, polyethylene glycol, senna   Vital Signs  Vitals:   03/29/24 2344 03/30/24 0454 03/30/24 0455 03/30/24 0737  BP: 94/67 102/64    Pulse: 69 63    Resp: 16 18    Temp: 97.9 F (36.6 C) 97.8 F (36.6 C)    TempSrc: Oral Oral    SpO2: 100% 100%  100%  Weight:   62.6 kg   Height:        Intake/Output Summary (Last 24 hours) at 03/30/2024 0822 Last data filed at 03/30/2024 0454 Gross per 24 hour  Intake 1020.79 ml  Output 1200 ml  Net -179.21 ml      03/30/2024    4:55 AM 03/29/2024   11:27 AM 03/24/2024    5:00 AM  Last 3 Weights  Weight (lbs) 138 lb 1.6 oz 134 lb 12.8 oz 144 lb 6.4 oz  Weight (kg) 62.642 kg 61.145 kg 65.5 kg      Telemetry  SR with PACs, PAT and PVC- Personally Reviewed  ECG  No new  - Personally Reviewed  Echo  03/04/24   1. Left ventricular ejection fraction, by estimation, is 35 to 40%. The  left ventricle has moderately decreased function. Left ventricular   endocardial border not optimally defined to evaluate regional wall motion.  Left ventricular diastolic function  could not be evaluated.   2. Right ventricular systolic function is mildly reduced. The right  ventricular size is normal.   3. Right atrial size was mildly dilated.   4. The mitral valve is normal in structure. Trivial mitral valve  regurgitation. No evidence of mitral stenosis.   5. The aortic valve is calcified. There is severe calcifcation of the  aortic valve. There is severe thickening of the aortic valve. Aortic valve  regurgitation is not visualized. Aortic valve sclerosis/calcification is  present, without any evidence of  aortic stenosis. Aortic valve Vmax measures 1.28 m/s.   6. Recommend limited study with definity  contrast to assess the LV apex  for thrombus and assess focal wall motion.  Physical Exam  GEN: Frail 84 yo in no acute distress.   Neck: No JVD Cardiac: RRR, no murmurs Respiratory: Clear to auscultation bilaterally. GI: Soft, nontender, non-distended  MS: No LE edema;  Ext   Extensive bruising   Labs  High Sensitivity Troponin:   Recent Labs  Lab 03/03/24 1414 03/03/24 1632 03/12/24 0110 03/12/24 0310  TROPONINIHS 15 11 30* 11    Recent Labs  Lab 03/28/24 1553 03/28/24 2018 03/29/24 0444  TRNPT 26* 26* 28*       Chemistry Recent Labs  Lab 03/28/24 1553 03/29/24 0444 03/30/24 0632  NA 132* 133* 133*  K 4.1 3.2* 4.3  CL 91* 93* 95*  CO2 30 33* 33*  GLUCOSE 99 115* 84  BUN 16 13 18   CREATININE 0.69 0.57* 0.50*  CALCIUM  8.8* 8.4* 8.5*  MG 2.1 2.1 1.8  PROT  --  5.8*  --   ALBUMIN  --  3.3*  --   AST  --  20  --   ALT  --  15  --   ALKPHOS  --  46  --   BILITOT  --  0.9  --   GFRNONAA >60 >60 >60  ANIONGAP 10 7 5     Lipids No results for input(s): CHOL, TRIG, HDL, LABVLDL, LDLCALC, CHOLHDL in the last 168 hours.  Hematology Recent Labs  Lab 03/28/24 1553 03/29/24 0444  WBC 4.8 4.2  RBC 4.15* 3.79*   HGB 12.5* 11.2*  HCT 40.8 37.6*  MCV 98.3 99.2  MCH 30.1 29.6  MCHC 30.6 29.8*  RDW 20.9* 20.7*  PLT 175 146*   Thyroid   Recent Labs  Lab 03/29/24 0444  TSH 2.050    BNP Recent Labs  Lab 03/28/24 2018 03/29/24 0444  PROBNP 791.0* 810.0*    DDimer No results for input(s): DDIMER in the last 168 hours.   Radiology  DG Chest Portable 1 View Result Date: 03/28/2024 CLINICAL DATA:  Pacemaker firing, preceded by chest pain. EXAM: PORTABLE CHEST 1 VIEW COMPARISON:  03/18/2024 FINDINGS: Normal-sized heart. Tortuous and partially calcified thoracic aorta. Mildly improved bilateral patchy density and mildly improved more confluence linear density in the left mid to lower lung zone. Otherwise, clear lungs with normal vascularity. No pleural fluid seen. Stable left subclavian pacer and AICD leads. Stable right neck neural stimulator lead. No acute bony abnormality. IMPRESSION: Mildly improved bilateral patchy atelectasis and/or pneumonia. Electronically Signed   By: Elspeth Bathe M.D.   On: 03/28/2024 17:33    Cardiac Studies  Echo 03/04/24 1. Left ventricular ejection fraction, by estimation, is 35 to 40%. The  left ventricle has moderately decreased function. Left ventricular  endocardial border not optimally defined to evaluate regional wall motion.  Left ventricular diastolic function  could not be evaluated.   2. Right ventricular systolic function is mildly reduced. The right  ventricular size is normal.   3. Right atrial size was mildly dilated.   4. The mitral valve is normal in structure. Trivial mitral valve  regurgitation. No evidence of mitral stenosis.   5. The aortic valve is calcified. There is severe calcifcation of the  aortic valve. There is severe thickening of the aortic valve. Aortic valve  regurgitation is not visualized. Aortic valve sclerosis/calcification is  present, without any evidence of  aortic stenosis. Aortic valve Vmax measures 1.28 m/s.   6.  Recommend limited study with definity  contrast to assess the LV apex  for thrombus and assess focal wall motion.    LHC   2023 Cardiac catheterization 03/19/2022 2 vessel obstructive CAD - 100% occlusion of distal OM3 appears to be the culprit. The ostial PDA lesion is unchanged from 2014 Patent stents in the LAD and RCA Mild LV dysfunction with inferior wall motion  abnormality Mildly elevated LVEDP 16 mm Hg    Expand All Collapse All    Cardiology Consultation     Patient ID: CHAVEZ ROSOL MRN: [id number]; DOB: October 14, 1940   Admit date: 03/28/2024 Date of Consult: 03/29/2024   PCP:  Esmeralda Morton SAUNDERS, PA-C              [facility] Providers Cardiologist:  Jerel Balding, MD  Cardiology APP:  Lesia Ozell Barter, PA-C  Electrophysiologist:  Danelle Birmingham, MD      Patient Profile: Steven Guzman is a 84 y.o. male with a hx of  HTN, HLD, AAA, COPD, GERD, hiatal hernia CAD (s/p inferior MI complicated by cardiac arrest in 1995 s/p stenting to RCA, PCI with stenting to LAD in 2011, NSTEMI in 03/18/2022 due to occlusion of OM 3 treated medically) ICM, chronic CHF (systolic) VT AFib, Atach      who is being seen 03/29/2024 for the evaluation of ICD shock at the request of Dr. Floretta.   History of Present Illness: Mr. Hippe last saw EP team, Jodie 01/20/24, doing OK, some increase in SOB, had returned to smoking (under stress w/wife's illness) and reported drinking ~2quarts of ice tea daily Both devices checked with reported normal function Lasix  added, educated on volume intake Low AF burden No VT Maintained on his amio and mexiletine   He saw cards team 02/09/24, some degree of chronic SOB (COPD, volume), still smoking. Some gait instability, dizziness, reported medication compliance   OF LATE: -- admitted early December where he was hospitalized for an acute heart failure exacerbation and hypoxic respiratory failure.  He was diuresed and ultimately  recommended discharge to a SNF but the patient preferred to go home due to the need to care for his ailing wife.   -- He was subsequently readmitted 12/14-12/16 for presumed multifocal pneumonia.  He was treated with a course of antibiotics and cultures demonstrated colonization of Pseudomonas and Klebsiella.  He was also found to have a rounded ill-defined masslike consolidative opacity in the left upper lobe concerning for possible malignancy versus pneumonia as well.  He was ultimately discharged after completing his antibiotic course within the hospitalization.   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  He tried again earlier this morning and had additional stress.  Earlier this afternoon the patient developed some very mild left-sided chest discomfort and ultimately suffered an ICD shock x1 at 3 PM.  This prompted him to call EMS and was brought into the ED for evaluation.  Cardiology consulted reported ICD check c/w appropriate tx for VT Placed on amiodarone  gtt   He tells me that (despite what is charted) he did in fact miss one day if not two days of his medications, particularly his amio, mexiletine, carvedilol  He is generally weaker then baseline with recent PNA/hospitalizations, no CP, + cough, not SOB     LABS K+ 4.1 > 3.2 (replacement already ordered) Mag 2.1, 2.1 BUN/Creat 16/0.69 > 0.57 BNP 791, 810 HS Trop T 26, 26, 28 WBC 4.2 H/H 11/37 Plts 146   Device interrogation: Presenting rhythm is AS/VS, PACs, PVC Battery is OK Auto lead testing is OK HV impedance is 60 Ohms Heart logic is 6 but trending down Numerous episodes labeled: NSVTs And VT one zone episodes (untreated) as well as  treated Dec 19-30th   In my personal review EGMs c/w inappropriate shocks 2/2 AT Morphology is c/w  conducted morphology and appear to be atrially driven       Device information [employer] dual chamber ICD implanted 08/2009, gen change 03/2022  (GORE lead) Barostim (standard) implanted 12/17/2022    Arrhythmia hx Dec 2015 PMVT with ICD w/HV therapies (in setting of missed meds) 2016 VT tx w/ATP  12/22/2014 EPS/ablation (extensive scar on the left ventricular septum) intereval notes report slow VT's observed, pace terminated in the office 07/21/2016 and in the hospital Jun 2018  Amiodarone  an active medicine at least since 2014 mexilletine started April 2018   + AFib/Atach       Past Medical History:  Diagnosis Date   AICD (automatic cardioverter/defibrillator) present      [employer] EL ICD D121/ [id number]   Aneurysm      a. Aneurysmal infrarenal aorta up to 33 mm on CT 10/2014, recommended f/u due 10/2017   Anginal pain     Anxiety     Arthritis      right foot   Basal cell carcinoma of nose      S/P MOHS   Biliary acute pancreatitis     CAD (coronary artery disease)      a. s/p MI in 1994 with PCI to LAD at that time b. cath 10/2012 demonstrated EF 30%, inferior akinesis with mild hypokinesis of all walls, patent LAD and RCA stents; ostial PDA with 80-90% obstruction with medical therapy recommended    Chronic systolic CHF (congestive heart failure) (HCC)      EF 30 to 35 % as of 09/2014.    CKD (chronic kidney disease), stage III (HCC)     Complication of anesthesia 10/2014    had to have defibrillator w/ERCP- CODED after having gallstones removed   COPD (chronic obstructive pulmonary disease) (HCC)      a. followed by pulmonary, COPD GOLD stage II   Depression     Diverticulosis of colon 07/2014    noted on CT   Dyspnea      with exertion   GERD (gastroesophageal reflux disease)     Hiatal hernia      large   History of kidney stones      passed stone   Hyperglycemia 10/2012   Hyperlipidemia     Hypertension     Myocardial infarction [facility])  1994; 2011   Pneumonia 1946; 2015   Prostate enlargement 07/2014    observed on CT   Tobacco abuse     Ventricular tachycardia (HCC)      a. 08/2009 s/p BSX E110 Teligen 100 AICD, ser#: [id number];  b. 08/2008 VT req ATP - detection reprogrammed from 160 to 150. c. EPS and VT ablation by Dr. Waddell 12/21/2014               Past Surgical History:  Procedure Laterality Date   BIOPSY   12/21/2017    Procedure: BIOPSY;  Surgeon: Abran Norleen SAILOR, MD;  Location: [facility];  Service: Endoscopy;;   CATARACT EXTRACTION W/ INTRAOCULAR LENS  IMPLANT, BILATERAL Bilateral ~ 2011   COLONOSCOPY       COLONOSCOPY WITH PROPOFOL  N/A 12/21/2017    Procedure: COLONOSCOPY WITH PROPOFOL ;  Surgeon: Abran Norleen SAILOR, MD;  Location: [facility];  Service: Endoscopy;  Laterality: N/A;   ELECTROPHYSIOLOGIC STUDY N/A 12/21/2014    Procedure: V Tach Ablation;  Surgeon: Danelle LELON Waddell, MD;  Location: [facility]  INVASIVE CV LAB;  Service: Cardiovascular;  Laterality: N/A;   ERCP N/A 11/16/2014    Procedure: ENDOSCOPIC RETROGRADE CHOLANGIOPANCREATOGRAPHY (ERCP);  Surgeon: Lamar JONETTA Aho, MD;  Location: [facility] ENDOSCOPY;  Service: Endoscopy;  Laterality: N/A;   ESOPHAGOGASTRODUODENOSCOPY (EGD) WITH PROPOFOL  N/A 12/21/2017    Procedure: ESOPHAGOGASTRODUODENOSCOPY (EGD) WITH PROPOFOL ;  Surgeon: Abran Norleen SAILOR, MD;  Location: [facility];  Service: Endoscopy;  Laterality: N/A;   EYE SURGERY       FOOT SURGERY Left 2005    fixed bone that stuck out in my ankle area   HEMORRHOID BANDING       ICD GENERATOR CHANGEOUT N/A 04/20/2022    Procedure: ICD GENERATOR CHANGEOUT;  Surgeon: Waddell Danelle ORN, MD;  Location[facility] INVASIVE CV LAB;  Service: Cardiovascular;  Laterality: N/A;   IMPLANTABLE CARDIOVERTER DEFIBRILLATOR IMPLANT   09/06/09    BSX dual chamber ICD implanted in [place]  for cardiac arrest 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   INGUINAL HERNIA REPAIR Right ~ 1995   INGUINAL HERNIA REPAIR Left 04/10/2022    Procedure: HERNIA REPAIR INGUINAL ADULT;   Surgeon: Ebbie Cough, MD;  Location: [facility] OR;  Service: General;  Laterality: Left;   LEFT HEART CATH AND CORONARY ANGIOGRAPHY N/A 03/19/2022    Procedure: LEFT HEART CATH AND CORONARY ANGIOGRAPHY;  Surgeon: Jordan, Peter M, MD;  [facility] INVASIVE CV LAB;  Service: Cardiovascular;  Laterality: N/A;   LEFT HEART CATHETERIZATION WITH CORONARY ANGIOGRAM N/A 11/25/2012    demonstrated EF 30%, inferior akinesis with mild hypokinesis of all walls, patent LAD and RCA stents; ostial PDA with 80-90% obstruction with medical therapy recommended   MOHS SURGERY   2008    nose, skin graft   POLYPECTOMY   12/21/2017    Procedure: POLYPECTOMY;  Surgeon: Abran Norleen SAILOR, MD;  Location: [facility];  Service: Endoscopy;;   RETINAL DETACHMENT SURGERY Right 2013   TENOLYSIS Right 12/21/2013    Procedure: TENOLYSIS FLEXOR CARPI RADIALIS ,DEBRIDEMENT RIGHT JOINT WRIST,DEBRIDEMENT SCAPHOTRAPEZIAL TRAPEZOID, REPAIR OF EXTENSOR HOOD;  Surgeon: Arley Curia, MD;  Location: [facility];  Service: Orthopedics;  Laterality: Right;   TOE SURGERY Right 09/2019    3rd toe/hammer toe   V-TACH ABLATION   12/21/2014   VIDEO BRONCHOSCOPY Bilateral 01/09/2016    Procedure: VIDEO BRONCHOSCOPY WITHOUT FLUORO;  Surgeon: Vicenta KATHEE Lennert, MD;  Location: [facility];  Service: Cardiopulmonary;  Laterality: Bilateral;          Home Medications:         Prior to Admission medications  Medication Sig Start Date End Date Taking? Authorizing Provider  acetaminophen  (TYLENOL ) 500 MG tablet Take 1,000 mg by mouth every 6 (six) hours as needed for moderate pain.     Yes [provider]  albuterol  (PROVENTIL ) (2.5 MG/3ML) 0.083% nebulizer solution USE 1 VIAL VIA NEBULIZER EVERY 6 HOURS AS NEEDED FOR WHEEZING OR SHORTNESS OF BREATH 07/18/19   Yes Olalere, Adewale A, MD  amiodarone  (PACERONE ) 200 MG tablet Take 1 tablet (200 mg total) by mouth daily. \ 08/27/23   Yes Lesia Ozell Barter, PA-C  Artificial Tear  Solution (SOOTHE XP OP) Place 2 drops into both eyes daily as needed (dry eyes).     Yes [provider]  Ascorbic Acid (VITAMIN C) 1000 MG tablet Take 1,000 mg by mouth daily.     Yes [provider]  aspirin  EC 81 MG tablet Take 1 tablet (81 mg total) by mouth daily. Swallow whole. 03/21/22   Yes Meng, Hao,  PA  Biotin  5000 MCG CAPS Take 5,000 mcg by mouth daily.     Yes [provider]  Calcium  Citrate-Vitamin D  (CALCIUM  + D PO) Take 1 tablet by mouth daily.     Yes [provider]  carvedilol  (COREG ) 3.125 MG tablet Take 1 tablet (3.125 mg total) by mouth 2 (two) times daily. 04/26/23   Yes Fountain, Madison L, NP  cetirizine (ZYRTEC) 10 MG tablet Take 10 mg by mouth daily.     Yes [provider]  Cholecalciferol  50 MCG (2000 UT) TABS Take 2,000 Units by mouth daily at 6 (six) AM. 09/12/18   Yes [provider]  Cyanocobalamin  (B12 LIQUID HEALTH BOOSTER PO) Take 5,000 mcg by mouth daily.     Yes [provider]  Docusate Sodium  (DSS) 100 MG CAPS Take 2 capsules by mouth daily in the afternoon. 02/10/22   Yes [provider]  ELIQUIS  5 MG TABS tablet TAKE 1 TABLET BY MOUTH TWICE A DAY 01/11/24   Yes Croitoru, Mihai, MD  empagliflozin  (JARDIANCE ) 10 MG TABS tablet Take 1 tablet (10 mg total) by mouth daily. 04/19/23   Yes Croitoru, Mihai, MD  escitalopram  (LEXAPRO ) 20 MG tablet Take 20 mg by mouth daily. 11/03/23   Yes [provider]  Evolocumab  (REPATHA  SURECLICK) 140 MG/ML SOAJ INJECT 1 ML INTO THE SKIN EVERY 14 DAYS 01/10/24   Yes Croitoru, Mihai, MD  feeding supplement (ENSURE PLUS HIGH PROTEIN) LIQD Take 237 mLs by mouth 2 (two) times daily between meals for 7 days. 03/24/24 03/31/24 Yes Christobal Guadalajara, MD  fluticasone  (FLONASE ) 50 MCG/ACT nasal spray Place 2 sprays into both nostrils daily. Patient taking differently: Place 2 sprays into both nostrils as needed for allergies. 02/13/21   Yes Fargo, Amy E, NP  furosemide   (LASIX ) 40 MG tablet Take 1 tablet (40 mg total) by mouth daily. 03/05/24 06/03/24 Yes Fairy Frames, MD  Guaifenesin  1200 MG TB12 Take 1,200 mg by mouth daily.     Yes [provider]  Ipratropium-Albuterol  (COMBIVENT  RESPIMAT) 20-100 MCG/ACT AERS respimat Inhale 1 puff into the lungs every 6 (six) hours. Shortness of breath or wheezing Patient taking differently: Inhale 1 puff into the lungs every 6 (six) hours as needed for wheezing or shortness of breath. Shortness of breath or wheezing 09/18/21   Yes Fargo, Amy E, NP  IRON PO Take 1 tablet by mouth daily.     Yes [provider]  Lactobacillus (ACIDOPHILUS) CAPS capsule Take 1 capsule by mouth 2 (two) times daily. 03/24/24 04/23/24 Yes Christobal Guadalajara, MD  lidocaine  (LIDODERM ) 5 % Place 1 patch onto the skin daily. Remove & Discard patch within 12 hours or as directed by MD Patient taking differently: Place 1 patch onto the skin daily as needed. Remove & Discard patch within 12 hours or as directed by MD 03/25/24   Yes Christobal Guadalajara, MD  Magnesium  200 MG TABS Take 400 mg by mouth daily.     Yes [provider]  melatonin 3 MG TABS tablet Take 3 mg by mouth at bedtime.     Yes [provider]  mexiletine (MEXITIL ) 200 MG capsule TAKE 1 CAPSULE BY MOUTH 2 TIMES A DAY 02/17/24   Yes Fountain, Madison L, NP  Multiple Vitamins-Minerals (CENTRUM ADULTS PO) Take 1 tablet by mouth daily.     Yes [provider]  mupirocin  ointment (BACTROBAN ) 2 % Apply 1 Application topically daily as needed (wound care). 11/04/22   Yes [provider]  nitroGLYCERIN  (NITROSTAT ) 0.4 MG SL tablet Place 1 tablet (0.4 mg total) under the tongue every 5 (five) minutes as needed for chest pain. DISSOLVE 1 TABLET UNDER THE TONGUE EVERY 5 MINUTES FOR 3 DOSES 12/15/23   Yes Court Dorn PARAS, MD  omeprazole (PRILOSEC) 20 MG capsule Take 20 mg by mouth daily.      Yes [provider]  potassium chloride  20 MEQ TBCR Take 1 tablet  (20 mEq total) by mouth daily. 03/05/24 06/03/24 Yes Fairy Frames, MD  predniSONE  (DELTASONE ) 10 MG tablet Take 2 tablets (20 mg total) by mouth daily with breakfast for 2 days, THEN 1 tablet (10 mg total) daily with breakfast for 2 days. 03/25/24 03/29/24 Yes Christobal Guadalajara, MD  pregabalin  (LYRICA ) 25 MG capsule Take by mouth in the morning and at bedtime. 03/08/23   Yes [provider]  STIOLTO RESPIMAT 2.5-2.5 MCG/ACT AERS Inhale 2 puffs into the lungs daily. 09/22/21   Yes [provider]  tamsulosin  (FLOMAX ) 0.4 MG CAPS capsule TAKE ONE CAPSULE BY MOUTH DAILY AFTER SUPPER Patient taking differently: Take 0.4 mg by mouth every evening. TAKE ONE CAPSULE BY MOUTH DAILY AFTER SUPPER 06/30/21   Yes Fargo, Amy E, NP  Respiratory Therapy Supplies (FLUTTER) DEVI Use as directed 03/16/17     McQuaid, Douglas B, MD  Spacer/Aero-Holding Chambers (OPTICHAMBER DIAMOND ) MISC optichamber [facility] 09/12/19     Olalere, Jennet LABOR, MD      Scheduled Meds:  apixaban   5 mg Oral BID   arformoterol   15 mcg Nebulization BID    And   umeclidinium bromide   1 puff Inhalation Daily   carvedilol   3.125 mg Oral BID WC   Chlorhexidine  Gluconate Cloth  6 each Topical Daily   furosemide   40 mg Oral Daily   mexiletine  200 mg Oral Q12H   potassium chloride  SA  20 mEq Oral Daily        Continuous Infusions:  amiodarone  30 mg/hr (03/29/24 0343)        PRN Meds: acetaminophen  **OR** acetaminophen , levalbuterol , melatonin, ondansetron  (ZOFRAN ) IV, polyethylene glycol, senna       Allergies:   [Allergies]  [Allergies]     Allergen Reactions   Sulfa Antibiotics Hives   Cephalexin  Itching     Social History:   Social History         Socioeconomic History   Marital status: Married      Spouse name: Not on file   Number of children: Not on file   Years of education: Not on file   Highest education level: Not on file  Occupational History   Occupation: Retired  Tobacco Use   Smoking status:  Every Day      Current packs/day: 1.00      Average packs/day: 1 pack/day for 55.0 years (55.0 ttl pk-yrs)      Types: Cigarettes   Smokeless tobacco: Never   Tobacco comments:      off/on, always ready to quit but does not work out - still occasional smokes  Vaping Use   Vaping status: Never Used  Substance and Sexual Activity   Alcohol  use: Yes      Comment: occasional   Drug use: No   Sexual activity: Not Currently  Other Topics Concern   Not on file  Social History Narrative   Not on file    Social Drivers of Health        Tobacco Use: High Risk (03/29/2024)    Patient History  Smoking Tobacco Use: Every Day     Smokeless Tobacco Use: Never     Passive Exposure: Not on file  Financial Resource Strain: Medium Risk (03/06/2021)    Overall Financial Resource Strain (CARDIA)     Difficulty of Paying Living Expenses: Somewhat hard  Food Insecurity: No Food Insecurity (03/13/2024)    Epic     Worried About Programme Researcher, Broadcasting/film/video in the Last Year: Never true     Ran Out of Food in the Last Year: Never true  Transportation Needs: No Transportation Needs (03/27/2024)    Received from [facility]     In the past 12 months, has lack of transportation kept you from medical appointments or from getting medications?: No     In the past 12 months, has lack of transportation kept you from meetings, work, or from getting things needed for daily living?: No  Physical Activity: Insufficiently Active (03/06/2021)    Exercise Vital Sign     Days of Exercise per Week: 4 days     Minutes of Exercise per Session: 20 min  Stress: No Stress Concern Present (12/27/2021)    Received from [facility], [facility] visits prior to 05/30/2022.    [employer] of Occupational Health - Occupational Stress Questionnaire     Feeling of Stress : Not at all  Social Connections: Moderately Isolated (03/13/2024)    Social Connection and Isolation Panel     Frequency of  Communication with Friends and Family: More than three times a week     Frequency of Social Gatherings with Friends and Family: Never     Attends Religious Services: Never     Database Administrator or Organizations: No     Attends Banker Meetings: Never     Marital Status: Married  Catering Manager Violence: Not At Risk (03/26/2024)    Received from [facility]    HITS     Over the last 12 months how often did your partner physically hurt you?: Never     Over the last 12 months how often did your partner insult you or talk down to you?: Never     Over the last 12 months how often did your partner threaten you with physical harm?: Never     Over the last 12 months how often did your partner scream or curse at you?: Never  Depression (PHQ2-9): Low Risk (03/12/2022)    Depression (PHQ2-9)     PHQ-2 Score: 0  Alcohol  Screen: Low Risk (03/06/2021)    Alcohol  Screen     Last Alcohol  Screening Score (AUDIT): 1  Housing: Low Risk (03/13/2024)    Epic     Unable to Pay for Housing in the Last Year: No     Number of Times Moved in the Last Year: 0     Homeless in the Last Year: No  Utilities: Not At Risk (03/13/2024)    Epic     Threatened with loss of utilities: No  Health Literacy: Not on file    Family History:        Family History  Problem Relation Age of Onset   Heart attack Brother     CAD Father     Hypertension Father     CAD Mother     Hypertension Mother     Hypertension Brother     Stroke Neg Hx  ROS:  Please see the history of present illness.  All other ROS reviewed and negative.      Physical Exam/Data:       Vitals:    03/29/24 0630 03/29/24 0715 03/29/24 0800 03/29/24 0811  BP: 110/69 111/76 110/88 (!) 140/75  Pulse: 71 65 87 89  Resp: 19 17 (!) 22 (!) 23  Temp:       97.7 F (36.5 C)  TempSrc:       Oral  SpO2: 99% 100% 90% 93%      Intake/Output Summary (Last 24 hours) at 03/29/2024 0852 Last data filed at 03/29/2024  0353    Gross per 24 hour  Intake 504.15 ml  Output 400 ml  Net 104.15 ml        03/24/2024    5:00 AM 03/23/2024    5:00 AM 03/22/2024    5:00 AM  Last 3 Weights  Weight (lbs) 144 lb 6.4 oz 144 lb 6.4 oz 143 lb 11.8 oz  Weight (kg) 65.5 kg 65.5 kg 65.2 kg     There is no height or weight on file to calculate BMI.  General:  appears thin, advanced atrophy, chronically ill, in no acute distress HEENT: normal Neck: no JVD Vascular: No carotid bruits; Distal pulses 2+ bilaterally Cardiac:  RRR; no murmurs, gallops or rubs Lungs:  some scattered rhonci, moving aie well, no wheezing, or rales  Abd: soft, nontender, no hepatomegaly  Ext: no edema Musculoskeletal:  No deformities, advacned atrophy, appears his age Skin: warm and dry  Neuro:  no focal abnormalities noted Psych:  Normal affect    EKG:  The EKG was personally reviewed and demonstrates:     #1 ST 119bpm, (barostim interference), PACs, no clear ischemic changes #2 SR 64bpm, PVC, PAC   Telemetry:  Telemetry was personally reviewed and demonstrates:   SR 80's, PACs, occ PVCs, infrequent couplet-3beat salvos   Relevant CV Studies:     TTE 03/04/24: IMPRESSIONS   1. Left ventricular ejection fraction, by estimation, is 35 to 40%. The  left ventricle has moderately decreased function. Left ventricular  endocardial border not optimally defined to evaluate regional wall motion.  Left ventricular diastolic function  could not be evaluated.   2. Right ventricular systolic function is mildly reduced. The right  ventricular size is normal.   3. Right atrial size was mildly dilated.   4. The mitral valve is normal in structure. Trivial mitral valve  regurgitation. No evidence of mitral stenosis.   5. The aortic valve is calcified. There is severe calcifcation of the  aortic valve. There is severe thickening of the aortic valve. Aortic valve  regurgitation is not visualized. Aortic valve sclerosis/calcification is   present, without any evidence of  aortic stenosis. Aortic valve Vmax measures 1.28 m/s.   6. Recommend limited study with definity  contrast to assess the LV apex  for thrombus and assess focal wall motion.    Echocardiogram 08/11/2023  1. Left ventricular ejection fraction, by estimation, is 30 to 35%. The  left ventricle has moderately decreased function. The left ventricle  demonstrates regional wall motion abnormalities (see scoring  diagram/findings for description). The left  ventricular internal cavity size was moderately dilated. Left ventricular  diastolic parameters are indeterminate. There is akinesis of the left  ventricular, basal-mid inferior wall, inferoseptal wall and inferolateral  wall. The average left ventricular  global longitudinal strain is -13.1 %. The global longitudinal strain is  abnormal.   2. Right ventricular  systolic function is normal. The right ventricular  size is mildly enlarged.   3. Left atrial size was mildly dilated.   4. Right atrial size was mild to moderately dilated.   5. The mitral valve is normal in structure. Mild mitral valve  regurgitation. No evidence of mitral stenosis.   6. The aortic valve is tricuspid. There is mild calcification of the  aortic valve. Aortic valve regurgitation is trivial. Aortic valve  sclerosis/calcification is present, without any evidence of aortic  stenosis.   7. Aortic dilatation noted. There is mild dilatation of the ascending  aorta, measuring 42 mm.       Cardiac catheterization 03/19/2022 2 vessel obstructive CAD - 100% occlusion of distal OM3 appears to be the culprit. The ostial PDA lesion is unchanged from 2014 Patent stents in the LAD and RCA Mild LV dysfunction with inferior wall motion abnormality Mildly elevated LVEDP 16 mm Hg   (Medically managed)   12/21/2014: EP/ablation Conclusion: Apparent successful catheter ablation of ventricular tachycardia with encircling lesions around the  extensive scar on the left ventricular septum. Following ablation, there is no additional ventricular tachycardia.         Patient Profile   84 y.o. male  who is being seen 03/29/2024 for the evaluation of ICD shock at the request of Dr. Floretta.   Assessment & Plan   1  ICD shock   Pt admitted after ICD shock   Felt to be inapprop for PAT  He has a slow VT (110 bpm) and ratese appear to have been in this parameter Probably due to fact that pt missed his meds for at least a couple days   Recomm Meds resumed   He is on IV amiodarone  while here     When goes back home I would resume at previous dose 200 mg daily  ON Toprol  XL (switch from carvedilol ) On lasix   Would resume Jardiance    2  Hx VT   s/p ablation   Now on amio and carvedilol  and mexilitene   Has ICD  3  Atrial rhythms  Hx atrial tach and PAF      Keep on current regimen     Keep on Xarelto   3  Hx HFrEF   Volume status OK   His BP has been a little low today  Keep meds as noted above   4 Hypokalemia  Repleted  Will need to be followed as outpt  Plan to go to SNF   OK to go from cardiac standpoint   WIll make sure has follow upt  Signed, Vina Gull, MD  03/30/2024, 8:22 AM    "

## 2024-03-31 DIAGNOSIS — I472 Ventricular tachycardia, unspecified: Secondary | ICD-10-CM | POA: Diagnosis not present

## 2024-03-31 DIAGNOSIS — I480 Paroxysmal atrial fibrillation: Secondary | ICD-10-CM | POA: Diagnosis not present

## 2024-03-31 LAB — MAGNESIUM: Magnesium: 2.1 mg/dL (ref 1.7–2.4)

## 2024-03-31 LAB — CBC
HCT: 33.6 % — ABNORMAL LOW (ref 39.0–52.0)
Hemoglobin: 10.2 g/dL — ABNORMAL LOW (ref 13.0–17.0)
MCH: 30.1 pg (ref 26.0–34.0)
MCHC: 30.4 g/dL (ref 30.0–36.0)
MCV: 99.1 fL (ref 80.0–100.0)
Platelets: 134 K/uL — ABNORMAL LOW (ref 150–400)
RBC: 3.39 MIL/uL — ABNORMAL LOW (ref 4.22–5.81)
RDW: 20.7 % — ABNORMAL HIGH (ref 11.5–15.5)
WBC: 5.6 K/uL (ref 4.0–10.5)
nRBC: 0.4 % — ABNORMAL HIGH (ref 0.0–0.2)

## 2024-03-31 LAB — BASIC METABOLIC PANEL WITH GFR
Anion gap: 8 (ref 5–15)
BUN: 17 mg/dL (ref 8–23)
CO2: 30 mmol/L (ref 22–32)
Calcium: 8.6 mg/dL — ABNORMAL LOW (ref 8.9–10.3)
Chloride: 96 mmol/L — ABNORMAL LOW (ref 98–111)
Creatinine, Ser: 0.49 mg/dL — ABNORMAL LOW (ref 0.61–1.24)
GFR, Estimated: >60 mL/min
Glucose, Bld: 98 mg/dL (ref 70–99)
Potassium: 4.1 mmol/L (ref 3.5–5.1)
Sodium: 133 mmol/L — ABNORMAL LOW (ref 135–145)

## 2024-03-31 MED ORDER — SENNOSIDES-DOCUSATE SODIUM 8.6-50 MG PO TABS: 2.0000 | ORAL_TABLET | Freq: Two times a day (BID) | ORAL | Status: AC

## 2024-03-31 MED ORDER — POLYETHYLENE GLYCOL 3350 17 G PO PACK: 17.0000 g | PACK | Freq: Every day | ORAL | Status: AC

## 2024-03-31 MED ORDER — PREGABALIN 25 MG PO CAPS: 25.0000 mg | ORAL_CAPSULE | Freq: Two times a day (BID) | ORAL | 0 refills | Status: DC

## 2024-03-31 MED ORDER — ENSURE PLUS HIGH PROTEIN PO LIQD: 237.0000 mL | Freq: Three times a day (TID) | ORAL | Status: AC

## 2024-03-31 MED ORDER — METOPROLOL SUCCINATE ER 50 MG PO TB24: 50.0000 mg | ORAL_TABLET | Freq: Every day | ORAL | Status: DC

## 2024-03-31 MED ORDER — ALPRAZOLAM 0.25 MG PO TABS
0.2500 mg | ORAL_TABLET | Freq: Two times a day (BID) | ORAL | Status: DC | PRN
  Administered 2024-03-31 – 2024-04-04 (×4): 0.25 mg via ORAL
  Filled 2024-03-31 (×4): qty 1

## 2024-03-31 MED ORDER — VITAMIN B-1 100 MG PO TABS: 100.0000 mg | ORAL_TABLET | Freq: Every day | ORAL | Status: AC

## 2024-03-31 NOTE — Progress Notes (Addendum)
 PT Cancellation Note  Patient Details Name: Steven Guzman MRN: [id number] DOB: 06/07/1940   Cancelled Treatment:    Reason Eval/Treat Not Completed:  13:05- Patient declined as he wanted to continue his phone conversation. Will follow up and re-attempt as schedule allows.  15:00- Pt still on the phone. Will continue to follow per POC.   Kate ORN, PT, DPT Secure Chat Preferred  Rehab Office [phone]   Kate BRAVO Wendolyn 03/31/2024, 1:06 PM

## 2024-03-31 NOTE — TOC Progression Note (Addendum)
 Transition of Care [facility]) - Progression Note    Patient Details  Name: Steven Guzman MRN: [id number] Date of Birth: [birth year]  Transition of [facility]) CM/SW Contact  Gwenn Frieze [place], [place] Phone Number: 03/31/2024, 10:20 AM  Clinical Narrative: [facility] to [facility] with [facility] admissions who agreed to review referral. Will update pt once determination received from [facility] as this is his preferred facility.   1345: Bed offer received from [facility]. [place] [phone] reports they are able to accept starting Monday if auth received. Pt updated and agreeable to plan. [place] requesting pt's sacral wound be evaluated by their wound care physician and nurse to determine treatment approach--MD updated. Will submit auth once updated PT note available.   Frieze Gwenn, MSW, LCSW [phone] (coverage)        Expected Discharge Plan: Skilled Nursing Facility Barriers to Discharge: Insurance Authorization, Continued Medical Work up, No SNF bed               Expected Discharge Plan and Services In-house Referral: Clinical Social Work   Post Acute Care Choice: Skilled Nursing Facility Living arrangements for the past 2 months: Single Family Home                                       Social Drivers of Health (SDOH) Interventions SDOH Screenings   Food Insecurity: No Food Insecurity (03/29/2024)  Housing: Low Risk (03/29/2024)  Transportation Needs: No Transportation Needs (03/29/2024)  Utilities: Not At Risk (03/29/2024)  Depression (PHQ2-9): Low Risk (03/12/2022)  Social Connections: Socially Integrated (03/29/2024)  Recent Concern: Social Connections - Moderately Isolated (03/13/2024)  Stress: No Stress Concern Present (12/27/2021)   Received from [facility] visits prior to 05/30/2022.  Tobacco Use: High Risk (03/29/2024)    Readmission Risk Interventions    03/14/2024    4:52 PM   Readmission Risk Prevention Plan  Transportation Screening Complete  Medication Review (RN Care Manager) Complete  HRI or Home Care Consult Complete  Palliative Care Screening Not Applicable  Skilled Nursing Facility Not Applicable

## 2024-03-31 NOTE — Plan of Care (Signed)

## 2024-03-31 NOTE — Progress Notes (Signed)
 Mobility Specialist Progress Note:   03/31/24 0941  Mobility  Activity Pivoted/transferred from bed to chair  Level of Assistance Contact guard assist, steadying assist  Assistive Device Four wheel walker  Distance Ambulated (ft) 8 ft  Activity Response Tolerated well  Mobility Referral Yes  Mobility visit 1 Mobility  Mobility Specialist Start Time (ACUTE ONLY) [id number]  Mobility Specialist Stop Time (ACUTE ONLY) 0941  Mobility Specialist Time Calculation (min) (ACUTE ONLY) 12 min   Received pt in bed and agreeable to mobility. Pt required MinG for safety. No c/o. Left pt in chair. Personal belongings and call light within reach. All needs met. RN present.  Lavanda Pollack Mobility Specialist  Please contact via [employer] or  Rehab Office [phone]

## 2024-03-31 NOTE — Progress Notes (Addendum)
 "  [facility] PROGRESS NOTE   Steven Guzman FMW:[id number] DOB: March 20, 1941 DOA: 03/28/2024  PCP: Esmeralda Morton SAUNDERS, PA-C  Brief History: 84 y.o. male with medical history significant for paroxysmal ventricular tachycardia, ICD placement 2010, VT ablation in 2016, paroxysmal atrial fibrillation chronically anticoagulated on Eliquis , CAD, chronic systolic heart failure in the setting of ischemic cardiomyopathy, COPD, chronic hypoxic respiratory failure on 3 L continuous nasal cannula at baseline, anemia of chronic disease associated baseline hemoglobin 10-12, who is admitted to [facility] on 03/28/2024 with paroxysmal ventricular tachycardia after presenting from home to [facility] ED complaining of ICD having fired.  Patient was recently hospitalized for pneumonia and discharged on 12/26.  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 Patient was hospitalized for further management.  Cardiology was consulted.  Consultants: Cardiology  Procedures: Setting changes to ICD    Subjective/Interval History: Patient feels well.  Denies any chest pain shortness of breath.  Looking forward to going to rehab.     Assessment/Plan:  Ventricular tachycardia Patient has ICD in situ.  Apparently his ICD fired and he presented to the emergency department.  No further episodes in the hospital.  Patient was seen by cardiology and placed on amiodarone  infusion. Patient was seen by electrophysiology.  Setting changes were made to his ICD.  Cardiology has signed off.  Amiodarone  changed back to oral.    Hypokalemia Improved with supplementation.  Monitor periodically.  Generalized weakness No focal neurological deficits noted.  Recent hospitalization for pneumonia noted.  SNF or rehab was recommended at that time but he elected to go home.  Seen by PT and OT.  Short-term rehab was recommended.  Patient agrees to go to his SNF this time  around.  Acute on chronic respiratory failure with hypoxia Uses oxygen  at home at 3 L/min.  Continue for now.  Saturations are stable.  Respiratory effort is normal.  Paroxysmal atrial fibrillation Stable.  Noted to be on apixaban .  Changed over from carvedilol  to metoprolol .  Noted to be stable.  Chronic systolic CHF Based on echo from early December his LVEF is 35 to 40%.  RV function was noted to be mildly reduced.  Diastolic parameters could not be assessed. Noted to be on furosemide .  Seems to be well compensated at this time. Patient is on metoprolol , furosemide , Jardiance .  Other GDMT will be deferred to cardiology at follow-up.  History of COPD Stable.  Anemia of chronic disease Stable hemoglobin noted.  No evidence for overt bleeding.  Wounds Wound type: Scalp; skin tear; flap lost; clean; bloody Right upper arm; skin tear; flap loose and non-viable; wound clean  Right buttock; DTPI; dark purple non-blanchable  Left buttock; CSD-chronic skin damage, Stage 1 discoloration; red Sacrum: Stage 3 Pressure Injury; 75% yellow/white-25% pink  Right elbow; skin tear; skin flap lost; yellow drainage Pressure Injury POA: Yes SNF will assess sacrum and determine appropriate treatment.   DVT Prophylaxis: Eliquis  Code Status: Full code Family Communication: Discussed with patient Disposition Plan: Await SNF.    Medications: Scheduled:  amiodarone   200 mg Oral Daily   apixaban   5 mg Oral BID   arformoterol   15 mcg Nebulization BID   And   umeclidinium bromide   1 puff Inhalation Daily   collagenase   Topical Daily   empagliflozin   10 mg Oral Daily   escitalopram   20 mg Oral Daily   feeding supplement  237 mL  Oral TID BM   furosemide   40 mg Oral Daily   metoprolol  succinate  50 mg Oral QHS   mexiletine  200 mg Oral Q12H   multivitamin with minerals  1 tablet Oral Daily   nutrition supplement (JUVEN)  1 packet Oral BID BM   polyethylene glycol  17 g Oral Daily   pregabalin    25 mg Oral BID   senna-docusate  2 tablet Oral BID   tamsulosin   0.4 mg Oral Daily   thiamine  100 mg Oral Daily   Continuous:   PRN:acetaminophen  **OR** acetaminophen , levalbuterol , melatonin, ondansetron  (ZOFRAN ) IV  Objective:  Vital Signs  Vitals:   03/30/24 1958 03/31/24 0033 03/31/24 0423 03/31/24 0755  BP: 108/66 97/60 107/61 104/65  Pulse: 67 67 62 63  Resp: 17 19 18 17   Temp: 98 F (36.7 C) 98.5 F (36.9 C) 98.6 F (37 C) 97.6 F (36.4 C)  TempSrc: Oral Oral Oral Axillary  SpO2: 96% 97% 95% 100%  Weight:   59 kg   Height:        Intake/Output Summary (Last 24 hours) at 03/31/2024 1116 Last data filed at 03/30/2024 1959 Gross per 24 hour  Intake 240 ml  Output 500 ml  Net -260 ml   Filed Weights   03/29/24 1127 03/30/24 0455 03/31/24 0423  Weight: 61.1 kg 62.6 kg 59 kg    General appearance: Awake alert.  In no distress Resp: Clear to auscultation bilaterally.  Normal effort Cardio: S1-S2 is normal regular.  No S3-S4.  No rubs murmurs or bruit GI: Abdomen is soft.  Nontender nondistended.  Bowel sounds are present normal.  No masses organomegaly Extremities: No edema.  Full range of motion of lower extremities. Neurologic: Alert and oriented x3.  No focal neurological deficits.     Lab Results:  Data Reviewed: I have personally reviewed following labs and reports of the imaging studies  CBC: Recent Labs  Lab 03/28/24 1553 03/29/24 0444 03/31/24 0342  WBC 4.8 4.2 5.6  NEUTROABS 3.5 2.5  --   HGB 12.5* 11.2* 10.2*  HCT 40.8 37.6* 33.6*  MCV 98.3 99.2 99.1  PLT 175 146* 134*    Basic Metabolic Panel: Recent Labs  Lab 03/28/24 1553 03/29/24 0444 03/30/24 0632 03/31/24 0342  NA 132* 133* 133* 133*  K 4.1 3.2* 4.3 4.1  CL 91* 93* 95* 96*  CO2 30 33* 33* 30  GLUCOSE 99 115* 84 98  BUN 16 13 18 17   CREATININE 0.69 0.57* 0.50* 0.49*  CALCIUM  8.8* 8.4* 8.5* 8.6*  MG 2.1 2.1 1.8 2.1  PHOS  --  2.3*  --   --     GFR: Estimated  Creatinine Clearance: 56.3 mL/min (A) (by C-G formula based on SCr of 0.49 mg/dL (L)).  Liver Function Tests: Recent Labs  Lab 03/29/24 0444  AST 20  ALT 15  ALKPHOS 46  BILITOT 0.9  PROT 5.8*  ALBUMIN 3.3*    BNP (last 3 results) Recent Labs    03/28/24 2018 03/29/24 0444  PROBNP 791.0* 810.0*   CBG: Recent Labs  Lab 03/24/24 1233  GLUCAP 149*    Thyroid  Function Tests: Recent Labs    03/29/24 0444  TSH 2.050    Anemia Panel: Recent Labs    03/29/24 0625  VITAMINB12 >4,000*    Radiology Studies: No results found.    LOS: 3 days   Isidor Bromell [employer] on [email or web address]  03/31/2024, 11:16 AM   "

## 2024-04-01 DIAGNOSIS — I472 Ventricular tachycardia, unspecified: Secondary | ICD-10-CM | POA: Diagnosis not present

## 2024-04-01 LAB — BASIC METABOLIC PANEL WITH GFR
Anion gap: 5 (ref 5–15)
BUN: 22 mg/dL (ref 8–23)
CO2: 34 mmol/L — ABNORMAL HIGH (ref 22–32)
Calcium: 8.9 mg/dL (ref 8.9–10.3)
Chloride: 96 mmol/L — ABNORMAL LOW (ref 98–111)
Creatinine, Ser: 0.51 mg/dL — ABNORMAL LOW (ref 0.61–1.24)
GFR, Estimated: >60 mL/min
Glucose, Bld: 109 mg/dL — ABNORMAL HIGH (ref 70–99)
Potassium: 4.2 mmol/L (ref 3.5–5.1)
Sodium: 135 mmol/L (ref 135–145)

## 2024-04-01 MED ORDER — POLYETHYLENE GLYCOL 3350 17 G PO PACK
17.0000 g | PACK | Freq: Two times a day (BID) | ORAL | Status: DC
  Administered 2024-04-02 – 2024-04-07 (×6): 17 g via ORAL
  Filled 2024-04-01 (×11): qty 1

## 2024-04-01 MED ORDER — BISACODYL 10 MG RE SUPP
10.0000 mg | Freq: Once | RECTAL | Status: AC
  Administered 2024-04-01: 10 mg via RECTAL
  Filled 2024-04-01: qty 1

## 2024-04-01 NOTE — Progress Notes (Signed)
 "  [facility] PROGRESS NOTE   Steven Guzman FMW:[id number] DOB: [birth year] DOA: 03/28/2024  PCP: Esmeralda Morton SAUNDERS, PA-C  Brief History: 84 y.o. male with medical history significant for paroxysmal ventricular tachycardia, ICD placement 2010, VT ablation in 2016, paroxysmal atrial fibrillation chronically anticoagulated on Eliquis , CAD, chronic systolic heart failure in the setting of ischemic cardiomyopathy, COPD, chronic hypoxic respiratory failure on 3 L continuous nasal cannula at baseline, anemia of chronic disease associated baseline hemoglobin 10-12, who is admitted to [facility] on 03/28/2024 with paroxysmal ventricular tachycardia after presenting from home to [facility] ED complaining of ICD having fired.  Patient was recently hospitalized for pneumonia and discharged on 12/26.  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 Patient was hospitalized for further management.  Cardiology was consulted.  Consultants: Cardiology  Procedures: Setting changes to ICD    Subjective/Interval History: Patient feels well.  No complaints offered.       Assessment/Plan:  Ventricular tachycardia Patient has ICD in situ.  Apparently his ICD fired and he presented to the emergency department.  No further episodes in the hospital.  Patient was seen by cardiology and placed on amiodarone  infusion. Patient was seen by electrophysiology.  Setting changes were made to his ICD.  Cardiology has signed off.  Amiodarone  changed back to oral.    Hypokalemia Improved with supplementation.  Monitor periodically.  Generalized weakness No focal neurological deficits noted.  Recent hospitalization for pneumonia noted.  SNF or rehab was recommended at that time but he elected to go home.  Seen by PT and OT.  Short-term rehab was recommended.  Patient agrees to go to his SNF this time around.  Acute on chronic respiratory failure with  hypoxia Uses oxygen  at home at 3 L/min.  Continue for now.  Saturations are stable.  Respiratory effort is normal.  Paroxysmal atrial fibrillation Stable.  Noted to be on apixaban .  Changed over from carvedilol  to metoprolol .  Noted to be stable.  Chronic systolic CHF Based on echo from early December his LVEF is 35 to 40%.  RV function was noted to be mildly reduced.  Diastolic parameters could not be assessed. Noted to be on furosemide .  Seems to be well compensated at this time. Patient is on metoprolol , furosemide , Jardiance .  Other GDMT will be deferred to cardiology at follow-up.  History of COPD Stable.  Anemia of chronic disease Stable hemoglobin noted.  No evidence for overt bleeding.  Wounds Wound type: Scalp; skin tear; flap lost; clean; bloody Right upper arm; skin tear; flap loose and non-viable; wound clean  Right buttock; DTPI; dark purple non-blanchable  Left buttock; CSD-chronic skin damage, Stage 1 discoloration; red Sacrum: Stage 3 Pressure Injury; 75% yellow/white-25% pink  Right elbow; skin tear; skin flap lost; yellow drainage Pressure Injury POA: Yes SNF will assess sacrum and determine appropriate treatment.   DVT Prophylaxis: Eliquis  Code Status: Full code Family Communication: Discussed with patient Disposition Plan: Await SNF.    Medications: Scheduled:  amiodarone   200 mg Oral Daily   apixaban   5 mg Oral BID   arformoterol   15 mcg Nebulization BID   And   umeclidinium bromide   1 puff Inhalation Daily   collagenase    Topical Daily   empagliflozin   10 mg Oral Daily   escitalopram   20 mg Oral Daily   feeding supplement  237 mL Oral TID BM   furosemide   40 mg  Oral Daily   metoprolol  succinate  50 mg Oral QHS   mexiletine  200 mg Oral Q12H   multivitamin with minerals  1 tablet Oral Daily   nutrition supplement (JUVEN)  1 packet Oral BID BM   polyethylene glycol  17 g Oral Daily   pregabalin   25 mg Oral BID   senna-docusate  2 tablet Oral BID    tamsulosin   0.4 mg Oral Daily   thiamine   100 mg Oral Daily   Continuous:   PRN:acetaminophen  **OR** acetaminophen , ALPRAZolam , levalbuterol , melatonin, ondansetron  (ZOFRAN ) IV  Objective:  Vital Signs  Vitals:   03/31/24 2356 04/01/24 0548 04/01/24 0727 04/01/24 0737  BP: 108/65 124/79 124/70   Pulse: (!) 57 90 (!) 57 (!) 55  Resp: 16 20 15 16   Temp: 98.4 F (36.9 C) 98.5 F (36.9 C) 98.5 F (36.9 C)   TempSrc: Oral Oral Oral   SpO2: 98% 98% 98% 98%  Weight:  59.3 kg    Height:        Intake/Output Summary (Last 24 hours) at 04/01/2024 1137 Last data filed at 04/01/2024 0730 Gross per 24 hour  Intake 477 ml  Output 2350 ml  Net -1873 ml   Filed Weights   03/30/24 0455 03/31/24 0423 04/01/24 0548  Weight: 62.6 kg 59 kg 59.3 kg    General appearance: Awake alert.  In no distress Resp: Clear to auscultation bilaterally.  Normal effort Cardio: S1-S2 is normal regular.  No S3-S4.  No rubs murmurs or bruit GI: Abdomen is soft.  Nontender nondistended.  Bowel sounds are present normal.  No masses organomegaly   Lab Results:  Data Reviewed: I have personally reviewed following labs and reports of the imaging studies  CBC: Recent Labs  Lab 03/28/24 1553 03/29/24 0444 03/31/24 0342  WBC 4.8 4.2 5.6  NEUTROABS 3.5 2.5  --   HGB 12.5* 11.2* 10.2*  HCT 40.8 37.6* 33.6*  MCV 98.3 99.2 99.1  PLT 175 146* 134*    Basic Metabolic Panel: Recent Labs  Lab 03/28/24 1553 03/29/24 0444 03/30/24 0632 03/31/24 0342 04/01/24 0451  NA 132* 133* 133* 133* 135  K 4.1 3.2* 4.3 4.1 4.2  CL 91* 93* 95* 96* 96*  CO2 30 33* 33* 30 34*  GLUCOSE 99 115* 84 98 109*  BUN 16 13 18 17 22   CREATININE 0.69 0.57* 0.50* 0.49* 0.51*  CALCIUM  8.8* 8.4* 8.5* 8.6* 8.9  MG 2.1 2.1 1.8 2.1  --   PHOS  --  2.3*  --   --   --     GFR: Estimated Creatinine Clearance: 56.6 mL/min (A) (by C-G formula based on SCr of 0.51 mg/dL (L)).  Liver Function Tests: Recent Labs  Lab  03/29/24 0444  AST 20  ALT 15  ALKPHOS 46  BILITOT 0.9  PROT 5.8*  ALBUMIN 3.3*    BNP (last 3 results) Recent Labs    03/28/24 2018 03/29/24 0444  PROBNP 791.0* 810.0*     Radiology Studies: No results found.    LOS: 4 days   Tacari Repass [employer] on [email or web address]  04/01/2024, 11:37 AM   "

## 2024-04-01 NOTE — Progress Notes (Signed)
 Physical Therapy Treatment Patient Details Name: Steven Guzman MRN: [id number] DOB: Oct 26, 1940 Today's Date: 04/01/2024   History of Present Illness Pt is 84 year old presented to [facility] on  03/28/24 for chest pain and ICD firing. PMH - COPD, CHF, CAD MI, aortic aneurysm, ACDF,  chronic neck/back pain, anemia, depression, chronic idiopathic thrombocytopenia,    PT Comments  Pt tolerated session well, demonstrating good progress in overall mobility. Pt was able to ambulate with AD and no physical assistance but continues to require frequent and prolonged seated rest breaks due to feeling SOB and fatigued. Pt also demonstrating good understanding of energy conservation techniques and activity pacing, requesting seated rest breaks prior to prompting from therapist. Pt then participated in LE strengthening, demonstrating good concentric and eccentric control with seated hip flexion, LAQs, and hip ADD with isometric hold. PT will continue to treat pt while he is admitted. Patient will benefit from continued inpatient follow up therapy, <3 hours/day.  SpO2 88-96% on 2L throughout session    If plan is discharge home, recommend the following: A little help with walking and/or transfers;A little help with bathing/dressing/bathroom;Assistance with cooking/housework;Assist for transportation;Help with stairs or ramp for entrance   Can travel by private vehicle     Yes  Equipment Recommendations  None recommended by PT    Recommendations for Other Services       Precautions / Restrictions Precautions Precautions: Fall Recall of Precautions/Restrictions: Intact Precaution/Restrictions Comments: watch O2, HR--on 3L at baseline Restrictions Weight Bearing Restrictions Per Provider Order: No     Mobility  Bed Mobility Overal bed mobility: Modified Independent             General bed mobility comments: supine to sit on L side of bed with HOB elevated and no physical assistance. sit to supine  with HOB flat. increased time to complete.    Transfers Overall transfer level: Needs assistance Equipment used: Rollator (4 wheels) Transfers: Sit to/from Stand Sit to Stand: Contact guard assist           General transfer comment: Pt completed 2 STS from EOB with one hand stabilizing rollator and one hand pushing up from the bed, and 2 STS from rollator seat, with no physical assistance. Pt demonstrating good understanding of mechanics of rollator, locking brakes before sitting on seat and prior to completing a stand. VC given for UE placement. Increased time to complete.    Ambulation/Gait Ambulation/Gait assistance: Contact guard assist Gait Distance (Feet): 75 Feet (x3 with prolonged seated rest break in between) Assistive device: Rollator (4 wheels) Gait Pattern/deviations: Step-through pattern, Decreased stride length, Shuffle, Trunk flexed Gait velocity: decreased Gait velocity interpretation: <1.8 ft/sec, indicate of risk for recurrent falls   General Gait Details: Pt demonstrating reciprocal gait pattern with reliance on rollator for improved stability. Pt able to ambulate 20' before taking a prolonged seated rest break due to LEs feeling fatigued and pt feeling SOB; SpO2 reading 92% on 2L.   Stairs             Wheelchair Mobility     Tilt Bed    Modified Rankin (Stroke Patients Only)       Balance Overall balance assessment: Needs assistance Sitting-balance support: No upper extremity supported, Feet supported Sitting balance-Leahy Scale: Good Sitting balance - Comments: seated EOB for LE exercises without LOB   Standing balance support: Bilateral upper extremity supported, During functional activity, Reliant on assistive device for balance Standing balance-Leahy Scale: Poor Standing balance comment: reliant on  external support                            Communication Communication Communication: Impaired Factors Affecting  Communication: Hearing impaired  Cognition Arousal: Alert Behavior During Therapy: WFL for tasks assessed/performed   PT - Cognitive impairments: No apparent impairments                         Following commands: Intact      Cueing Cueing Techniques: Verbal cues  Exercises General Exercises - Lower Extremity Long Arc Quad: Strengthening, Both, 10 reps, Seated Hip ABduction/ADduction: Strengthening, Both, 10 reps, Seated (3s hold) Hip Flexion/Marching: Strengthening, Both, 10 reps, Seated    General Comments General comments (skin integrity, edema, etc.): SpO2 88-96% on 2L      Pertinent Vitals/Pain Pain Assessment Pain Assessment: No/denies pain    Home Living                          Prior Function            PT Goals (current goals can now be found in the care plan section) Acute Rehab PT Goals Patient Stated Goal: go to rehab PT Goal Formulation: With patient Time For Goal Achievement: 04/12/24 Potential to Achieve Goals: Good Progress towards PT goals: Progressing toward goals    Frequency    Min 2X/week      PT Plan      Co-evaluation              AM-PAC PT 6 Clicks Mobility   Outcome Measure  Help needed turning from your back to your side while in a flat bed without using bedrails?: A Little Help needed moving from lying on your back to sitting on the side of a flat bed without using bedrails?: A Little Help needed moving to and from a bed to a chair (including a wheelchair)?: A Little Help needed standing up from a chair using your arms (e.g., wheelchair or bedside chair)?: A Little Help needed to walk in hospital room?: A Little Help needed climbing 3-5 steps with a railing? : Total 6 Click Score: 16    End of Session Equipment Utilized During Treatment: Gait belt;Oxygen  Activity Tolerance: Patient tolerated treatment well Patient left: in bed;with call bell/phone within reach;with bed alarm set Nurse  Communication: Mobility status PT Visit Diagnosis: Unsteadiness on feet (R26.81);Other abnormalities of gait and mobility (R26.89);Muscle weakness (generalized) (M62.81);Difficulty in walking, not elsewhere classified (R26.2)     Time: 8476-8395 PT Time Calculation (min) (ACUTE ONLY): 41 min  Charges:    $Gait Training: 23-37 mins $Therapeutic Exercise: 8-22 mins PT General Charges $$ ACUTE PT VISIT: 1 Visit                     Leontine Hilt DPT Acute Rehab Services [phone] Prefer contact via chat    Leontine NOVAK Shaylen Nephew 04/01/2024, 4:48 PM

## 2024-04-01 NOTE — Plan of Care (Signed)
  Problem: Clinical Measurements: Goal: Respiratory complications will improve Outcome: Progressing Goal: Cardiovascular complication will be avoided Outcome: Progressing   Problem: Nutrition: Goal: Adequate nutrition will be maintained Outcome: Progressing   Problem: Elimination: Goal: Will not experience complications related to urinary retention Outcome: Progressing   Problem: Safety: Goal: Ability to remain free from injury will improve Outcome: Progressing

## 2024-04-02 DIAGNOSIS — I959 Hypotension, unspecified: Secondary | ICD-10-CM

## 2024-04-02 DIAGNOSIS — I480 Paroxysmal atrial fibrillation: Secondary | ICD-10-CM | POA: Diagnosis not present

## 2024-04-02 DIAGNOSIS — I472 Ventricular tachycardia, unspecified: Secondary | ICD-10-CM | POA: Diagnosis not present

## 2024-04-02 MED ORDER — CAPSAICIN 0.025 % EX CREA
TOPICAL_CREAM | Freq: Two times a day (BID) | CUTANEOUS | Status: DC
  Filled 2024-04-02: qty 60

## 2024-04-02 MED ORDER — FUROSEMIDE 20 MG PO TABS
20.0000 mg | ORAL_TABLET | Freq: Every day | ORAL | Status: DC
  Administered 2024-04-03 – 2024-04-07 (×5): 20 mg via ORAL
  Filled 2024-04-02 (×5): qty 1

## 2024-04-02 MED ORDER — EMPAGLIFLOZIN 10 MG PO TABS
10.0000 mg | ORAL_TABLET | Freq: Every day | ORAL | Status: DC
  Administered 2024-04-03 – 2024-04-07 (×5): 10 mg via ORAL
  Filled 2024-04-02 (×5): qty 1

## 2024-04-02 MED ORDER — HYDROCORTISONE 1 % EX CREA
TOPICAL_CREAM | Freq: Four times a day (QID) | CUTANEOUS | Status: DC | PRN
  Filled 2024-04-02: qty 28

## 2024-04-02 MED ORDER — METOPROLOL SUCCINATE ER 25 MG PO TB24
12.5000 mg | ORAL_TABLET | Freq: Every day | ORAL | Status: DC
  Administered 2024-04-03 – 2024-04-06 (×4): 12.5 mg via ORAL
  Filled 2024-04-02 (×4): qty 1

## 2024-04-02 NOTE — Progress Notes (Signed)
 Mobility Specialist Progress Note;   04/02/24 0949  Mobility  Activity Pivoted/transferred from bed to chair  Level of Assistance Minimal assist, patient does 75% or more  Assistive Device Front wheel walker  Distance Ambulated (ft) 5 ft  Activity Response Tolerated fair  Mobility Referral Yes  Mobility visit 1 Mobility  Mobility Specialist Start Time (ACUTE ONLY) 0949  Mobility Specialist Stop Time (ACUTE ONLY) 1006  Mobility Specialist Time Calculation (min) (ACUTE ONLY) 17 min   Pt agreeable to mobility. RN requested only to transfer to chair this session d/t low Bps this AM. Required MinA to stand from lower surface and to safely transfer from bed to chair. VSS on 2LO2. No c/o when asked. Pt left comfortably in chair with all needs met, alarm on.   Lauraine Erm Mobility Specialist Please contact via SecureChat or [employer] [phone]

## 2024-04-02 NOTE — Progress Notes (Signed)
 "  [facility] PROGRESS NOTE   ESTIBEN MIZUNO FMW:[id number] DOB: [birth year] DOA: 03/28/2024  PCP: Esmeralda Morton SAUNDERS, PA-C  Brief History: 84 y.o. male with medical history significant for paroxysmal ventricular tachycardia, ICD placement 2010, VT ablation in 2016, paroxysmal atrial fibrillation chronically anticoagulated on Eliquis , CAD, chronic systolic heart failure in the setting of ischemic cardiomyopathy, COPD, chronic hypoxic respiratory failure on 3 L continuous nasal cannula at baseline, anemia of chronic disease associated baseline hemoglobin 10-12, who is admitted to [facility] on 03/28/2024 with paroxysmal ventricular tachycardia after presenting from home to [facility] ED complaining of ICD having fired.  Patient was recently hospitalized for pneumonia and discharged on 12/26.  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 Patient was hospitalized for further management.  Cardiology was consulted.  Consultants: Cardiology  Procedures: Setting changes to ICD    Subjective/Interval History: He feels well this morning.  Does complain of some burning pain in his feet which is chronic for him.  Capsaicin  cream was offered and he agrees.   Assessment/Plan:  Ventricular tachycardia Patient has ICD in situ.  Apparently his ICD fired and he presented to the emergency department.  No further episodes in the hospital.  Patient was seen by cardiology and placed on amiodarone  infusion. Patient was seen by electrophysiology.  Setting changes were made to his ICD.  Cardiology has signed off.  Amiodarone  changed back to oral.    Hypokalemia Improved with supplementation.  Monitor periodically.  Generalized weakness No focal neurological deficits noted.  Recent hospitalization for pneumonia noted.  SNF or rehab was recommended at that time but he elected to go home.  Seen by PT and OT.  Short-term rehab was recommended.   Patient agrees to go to his SNF this time around.  Acute on chronic respiratory failure with hypoxia Uses oxygen  at home at 3 L/min.  Continue for now.  Saturations are stable.  Respiratory effort is normal.  Paroxysmal atrial fibrillation Stable.  Noted to be on apixaban .  Changed over from carvedilol  to metoprolol .  Noted to have low blood pressures this morning.  His metoprolol  and furosemide  were held.  Continue with amiodarone  and mexiletine. May also need to hold his Jardiance .  Will see how he does over the next 24 hours and then decide on resumption of these medications.  He is asymptomatic though.  Chronic systolic CHF Based on echo from early December his LVEF is 35 to 40%.  RV function was noted to be mildly reduced.  Diastolic parameters could not be assessed. Volume status is stable. He was on furosemide , metoprolol , Jardiance .  However as discussed above due to low blood pressures Dexopin held.  Will reassess tomorrow. Other GDMT will be deferred to cardiology at follow-up.  History of COPD Stable.  Anemia of chronic disease Stable hemoglobin noted.  No evidence for overt bleeding.  Wounds Wound type: Scalp; skin tear; flap lost; clean; bloody Right upper arm; skin tear; flap loose and non-viable; wound clean  Right buttock; DTPI; dark purple non-blanchable  Left buttock; CSD-chronic skin damage, Stage 1 discoloration; red Sacrum: Stage 3 Pressure Injury; 75% yellow/white-25% pink  Right elbow; skin tear; skin flap lost; yellow drainage Pressure Injury POA: Yes SNF will assess sacrum and determine appropriate treatment.   DVT Prophylaxis: Eliquis  Code Status: Full code Family Communication: Discussed with patient Disposition Plan: Await SNF.    Medications: Scheduled:  amiodarone   200  mg Oral Daily   apixaban   5 mg Oral BID   arformoterol   15 mcg Nebulization BID   And   umeclidinium bromide   1 puff Inhalation Daily   capsaicin    Topical BID   collagenase     Topical Daily   [START ON 04/03/2024] empagliflozin   10 mg Oral Daily   escitalopram   20 mg Oral Daily   feeding supplement  237 mL Oral TID BM   [START ON 04/03/2024] furosemide   20 mg Oral Daily   [START ON 04/03/2024] metoprolol  succinate  12.5 mg Oral QHS   mexiletine  200 mg Oral Q12H   multivitamin with minerals  1 tablet Oral Daily   nutrition supplement (JUVEN)  1 packet Oral BID BM   polyethylene glycol  17 g Oral BID   pregabalin   25 mg Oral BID   senna-docusate  2 tablet Oral BID   tamsulosin   0.4 mg Oral Daily   thiamine   100 mg Oral Daily   Continuous:   PRN:acetaminophen  **OR** acetaminophen , ALPRAZolam , hydrocortisone  cream, levalbuterol , melatonin, ondansetron  (ZOFRAN ) IV  Objective:  Vital Signs  Vitals:   04/01/24 2259 04/02/24 0511 04/02/24 0828 04/02/24 0918  BP: (!) 103/57 91/62  107/67  Pulse: 69 (!) 57 (!) 56   Resp: 18 20 19 20   Temp: 98.3 F (36.8 C) 98.2 F (36.8 C)  97.8 F (36.6 C)  TempSrc: Oral Oral  Oral  SpO2: 97% 99% 99%   Weight:  62.1 kg    Height:        Intake/Output Summary (Last 24 hours) at 04/02/2024 1113 Last data filed at 04/02/2024 1048 Gross per 24 hour  Intake 234 ml  Output 1245 ml  Net -1011 ml   Filed Weights   03/31/24 0423 04/01/24 0548 04/02/24 0511  Weight: 59 kg 59.3 kg 62.1 kg    General appearance: Awake alert.  In no distress Resp: Clear to auscultation bilaterally.  Normal effort Cardio: S1-S2 is normal regular.  No S3-S4.  No rubs murmurs or bruit GI: Abdomen is soft.  Nontender nondistended.  Bowel sounds are present normal.  No masses organomegaly Extremities: Both feet are well-perfused.  Pulses are present.  No lesions identified. No focal neurological deficits.   Lab Results:  Data Reviewed: I have personally reviewed following labs and reports of the imaging studies  CBC: Recent Labs  Lab 03/28/24 1553 03/29/24 0444 03/31/24 0342  WBC 4.8 4.2 5.6  NEUTROABS 3.5 2.5  --   HGB 12.5* 11.2*  10.2*  HCT 40.8 37.6* 33.6*  MCV 98.3 99.2 99.1  PLT 175 146* 134*    Basic Metabolic Panel: Recent Labs  Lab 03/28/24 1553 03/29/24 0444 03/30/24 0632 03/31/24 0342 04/01/24 0451  NA 132* 133* 133* 133* 135  K 4.1 3.2* 4.3 4.1 4.2  CL 91* 93* 95* 96* 96*  CO2 30 33* 33* 30 34*  GLUCOSE 99 115* 84 98 109*  BUN 16 13 18 17 22   CREATININE 0.69 0.57* 0.50* 0.49* 0.51*  CALCIUM  8.8* 8.4* 8.5* 8.6* 8.9  MG 2.1 2.1 1.8 2.1  --   PHOS  --  2.3*  --   --   --     GFR: Estimated Creatinine Clearance: 59.3 mL/min (A) (by C-G formula based on SCr of 0.51 mg/dL (L)).  Liver Function Tests: Recent Labs  Lab 03/29/24 0444  AST 20  ALT 15  ALKPHOS 46  BILITOT 0.9  PROT 5.8*  ALBUMIN 3.3*    BNP (last 3  results) Recent Labs    03/28/24 2018 03/29/24 0444  PROBNP 791.0* 810.0*     Radiology Studies: No results found.    LOS: 5 days   Ashur Glatfelter [employer] on [email or web address]  04/02/2024, 11:13 AM   "

## 2024-04-02 NOTE — TOC Progression Note (Signed)
 Transition of Care [facility]) - Progression Note    Patient Details  Name: Steven Guzman MRN: [id number] Date of Birth: 1941-03-10  Transition of Care [facility]) CM/SW Contact  Gwenn Frieze [place], [place] Phone Number: 04/02/2024, 12:44 PM  Clinical Narrative: Home and Community/[employer] auth request submitted for [facility], reference [id number].   Frieze Gwenn, MSW, LCSW [phone] (coverage)        Expected Discharge Plan: Skilled Nursing Facility Barriers to Discharge: Insurance Authorization, Continued Medical Work up, No SNF bed               Expected Discharge Plan and Services In-house Referral: Clinical Social Work   Post Acute Care Choice: Skilled Nursing Facility Living arrangements for the past 2 months: Single Family Home                                       Social Drivers of Health (SDOH) Interventions SDOH Screenings   Food Insecurity: No Food Insecurity (03/29/2024)  Housing: Low Risk (03/29/2024)  Transportation Needs: No Transportation Needs (03/29/2024)  Utilities: Not At Risk (03/29/2024)  Depression (PHQ2-9): Low Risk (03/12/2022)  Social Connections: Socially Integrated (03/29/2024)  Recent Concern: Social Connections - Moderately Isolated (03/13/2024)  Stress: No Stress Concern Present (12/27/2021)   Received from [facility] visits prior to 05/30/2022.  Tobacco Use: High Risk (03/29/2024)    Readmission Risk Interventions    03/14/2024    4:52 PM  Readmission Risk Prevention Plan  Transportation Screening Complete  Medication Review (RN Care Manager) Complete  HRI or Home Care Consult Complete  Palliative Care Screening Not Applicable  Skilled Nursing Facility Not Applicable

## 2024-04-03 LAB — BASIC METABOLIC PANEL WITH GFR
Anion gap: 6 (ref 5–15)
BUN: 21 mg/dL (ref 8–23)
CO2: 33 mmol/L — ABNORMAL HIGH (ref 22–32)
Calcium: 9 mg/dL (ref 8.9–10.3)
Chloride: 96 mmol/L — ABNORMAL LOW (ref 98–111)
Creatinine, Ser: 0.47 mg/dL — ABNORMAL LOW (ref 0.61–1.24)
GFR, Estimated: >60 mL/min
Glucose, Bld: 97 mg/dL (ref 70–99)
Potassium: 4.5 mmol/L (ref 3.5–5.1)
Sodium: 135 mmol/L (ref 135–145)

## 2024-04-03 LAB — CBC
HCT: 33.7 % — ABNORMAL LOW (ref 39.0–52.0)
Hemoglobin: 10.3 g/dL — ABNORMAL LOW (ref 13.0–17.0)
MCH: 30.2 pg (ref 26.0–34.0)
MCHC: 30.6 g/dL (ref 30.0–36.0)
MCV: 98.8 fL (ref 80.0–100.0)
Platelets: 117 K/uL — ABNORMAL LOW (ref 150–400)
RBC: 3.41 MIL/uL — ABNORMAL LOW (ref 4.22–5.81)
RDW: 21.7 % — ABNORMAL HIGH (ref 11.5–15.5)
WBC: 4.3 K/uL (ref 4.0–10.5)
nRBC: 0.7 % — ABNORMAL HIGH (ref 0.0–0.2)

## 2024-04-03 LAB — MAGNESIUM: Magnesium: 1.9 mg/dL (ref 1.7–2.4)

## 2024-04-03 MED ORDER — HYDROCORTISONE 1 % EX CREA: TOPICAL_CREAM | Freq: Four times a day (QID) | CUTANEOUS | Status: AC | PRN

## 2024-04-03 MED ORDER — MAGNESIUM OXIDE -MG SUPPLEMENT 400 (240 MG) MG PO TABS: 400.0000 mg | ORAL_TABLET | Freq: Two times a day (BID) | ORAL | Status: AC

## 2024-04-03 MED ORDER — CAPSAICIN 0.025 % EX CREA: TOPICAL_CREAM | Freq: Two times a day (BID) | CUTANEOUS | Status: AC

## 2024-04-03 MED ORDER — FUROSEMIDE 20 MG PO TABS: 20.0000 mg | ORAL_TABLET | Freq: Every day | ORAL | Status: AC

## 2024-04-03 MED ORDER — ALPRAZOLAM 0.25 MG PO TABS: 0.2500 mg | ORAL_TABLET | Freq: Two times a day (BID) | ORAL | 0 refills | Status: DC | PRN

## 2024-04-03 MED ORDER — METOPROLOL SUCCINATE ER 25 MG PO TB24: 12.5000 mg | ORAL_TABLET | Freq: Every day | ORAL | Status: AC

## 2024-04-03 MED ORDER — POTASSIUM CHLORIDE ER 20 MEQ PO TBCR: 10.0000 meq | EXTENDED_RELEASE_TABLET | Freq: Every day | ORAL | Status: AC

## 2024-04-03 MED ORDER — MAGNESIUM OXIDE -MG SUPPLEMENT 400 (240 MG) MG PO TABS
400.0000 mg | ORAL_TABLET | Freq: Two times a day (BID) | ORAL | Status: DC
  Administered 2024-04-03 – 2024-04-07 (×9): 400 mg via ORAL
  Filled 2024-04-03 (×9): qty 1

## 2024-04-03 NOTE — Care Management Important Message (Signed)
 Important Message  Patient Details  Name: Steven Guzman MRN: [id number] Date of Birth: 05-12-1940   Important Message Given:  Yes - Medicare IM     [name] 04/03/2024, 10:58 AM

## 2024-04-03 NOTE — Plan of Care (Signed)

## 2024-04-03 NOTE — Progress Notes (Signed)
 "  [facility] PROGRESS NOTE   Steven Guzman FMW:[id number] DOB: [birth year] DOA: 03/28/2024  PCP: Esmeralda Morton SAUNDERS, PA-C  Brief History: 84 y.o. male with medical history significant for paroxysmal ventricular tachycardia, ICD placement 2010, VT ablation in 2016, paroxysmal atrial fibrillation chronically anticoagulated on Eliquis , CAD, chronic systolic heart failure in the setting of ischemic cardiomyopathy, COPD, chronic hypoxic respiratory failure on 3 L continuous nasal cannula at baseline, anemia of chronic disease associated baseline hemoglobin 10-12, who is admitted to [facility] on 03/28/2024 with paroxysmal ventricular tachycardia after presenting from home to [facility] ED complaining of ICD having fired.  Patient was recently hospitalized for pneumonia and discharged on 12/26.  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 Patient was hospitalized for further management.  Cardiology was consulted.  Consultants: Cardiology  Procedures: Setting changes to ICD    Subjective/Interval History: Patient feels well.  No complaints offered this morning.  No dizziness or lightheadedness.  No chest pain or shortness of breath.  Assessment/Plan:  Ventricular tachycardia Patient has ICD in situ.  Apparently his ICD fired and he presented to the emergency department.  No further episodes in the hospital.  Patient was seen by cardiology and placed on amiodarone  infusion. Patient was seen by electrophysiology.  Setting changes were made to his ICD.  Cardiology has signed off.  Amiodarone  changed back to oral.  Also on mexiletine.  Hypokalemia Improved with supplementation.  Monitor periodically.  Generalized weakness No focal neurological deficits noted.  Recent hospitalization for pneumonia noted.  SNF or rehab was recommended at that time but he elected to go home.  Seen by PT and OT.  Short-term rehab was recommended.   Patient agrees to go to his SNF this time around.  Acute on chronic respiratory failure with hypoxia Uses oxygen  at home at 3 L/min.  Continue for now.  Saturations are stable.  Respiratory effort is normal.  Paroxysmal atrial fibrillation Stable.  Noted to be on apixaban .  Changed over from carvedilol  to metoprolol .  Low blood pressures were noted.  Dose of metoprolol  has been decreased.  Blood pressures are stable this morning.    Chronic systolic CHF Based on echo from early December his LVEF is 35 to 40%.  RV function was noted to be mildly reduced.  Diastolic parameters could not be assessed. Volume status is stable. He was on furosemide , metoprolol , Jardiance .  Due to low blood pressures his furosemide  dose has been decreased.  Metoprolol  dose also decreased.  Continue Jardiance .  Blood pressure has stabilized.  Can be monitored at skilled nursing facility.   Other GDMT will be deferred to cardiology at follow-up.  History of COPD Stable.  Anemia of chronic disease Stable hemoglobin noted.  No evidence for overt bleeding.  Wounds Wound type: Scalp; skin tear; flap lost; clean; bloody Right upper arm; skin tear; flap loose and non-viable; wound clean  Right buttock; DTPI; dark purple non-blanchable  Left buttock; CSD-chronic skin damage, Stage 1 discoloration; red Sacrum: Stage 3 Pressure Injury; 75% yellow/white-25% pink  Right elbow; skin tear; skin flap lost; yellow drainage Pressure Injury POA: Yes SNF will assess sacrum and determine appropriate treatment.   DVT Prophylaxis: Eliquis  Code Status: Full code Family Communication: Discussed with patient Disposition Plan: Await SNF.    Medications: Scheduled:  amiodarone   200 mg Oral Daily   apixaban   5 mg Oral BID   arformoterol   15 mcg  Nebulization BID   And   umeclidinium bromide   1 puff Inhalation Daily   capsaicin    Topical BID   collagenase    Topical Daily   empagliflozin   10 mg Oral Daily   escitalopram   20  mg Oral Daily   feeding supplement  237 mL Oral TID BM   furosemide   20 mg Oral Daily   magnesium  oxide  400 mg Oral BID   metoprolol  succinate  12.5 mg Oral QHS   mexiletine  200 mg Oral Q12H   multivitamin with minerals  1 tablet Oral Daily   nutrition supplement (JUVEN)  1 packet Oral BID BM   polyethylene glycol  17 g Oral BID   pregabalin   25 mg Oral BID   senna-docusate  2 tablet Oral BID   tamsulosin   0.4 mg Oral Daily   thiamine   100 mg Oral Daily   Continuous:   PRN:acetaminophen  **OR** acetaminophen , ALPRAZolam , hydrocortisone  cream, levalbuterol , melatonin, ondansetron  (ZOFRAN ) IV  Objective:  Vital Signs  Vitals:   04/03/24 0520 04/03/24 0811 04/03/24 0838 04/03/24 0841  BP: 115/66 (!) 112/58    Pulse: 60 64 65   Resp: 18 18 17    Temp: 98.2 F (36.8 C) 98.5 F (36.9 C)    TempSrc: Oral Oral    SpO2: 98% 98% 96% 96%  Weight: 64.4 kg     Height:        Intake/Output Summary (Last 24 hours) at 04/03/2024 1055 Last data filed at 04/03/2024 0800 Gross per 24 hour  Intake 1427 ml  Output 1860 ml  Net -433 ml   Filed Weights   04/01/24 0548 04/02/24 0511 04/03/24 0520  Weight: 59.3 kg 62.1 kg 64.4 kg    General appearance: Awake alert.  In no distress Resp: Clear to auscultation bilaterally.  Normal effort Cardio: S1-S2 is normal regular.  No S3-S4.  No rubs murmurs or bruit GI: Abdomen is soft.  Nontender nondistended.  Bowel sounds are present normal.  No masses organomegaly   Lab Results:  Data Reviewed: I have personally reviewed following labs and reports of the imaging studies  CBC: Recent Labs  Lab 03/28/24 1553 03/29/24 0444 03/31/24 0342 04/03/24 0458  WBC 4.8 4.2 5.6 4.3  NEUTROABS 3.5 2.5  --   --   HGB 12.5* 11.2* 10.2* 10.3*  HCT 40.8 37.6* 33.6* 33.7*  MCV 98.3 99.2 99.1 98.8  PLT 175 146* 134* 117*    Basic Metabolic Panel: Recent Labs  Lab 03/28/24 1553 03/29/24 0444 03/30/24 0632 03/31/24 0342 04/01/24 0451  04/03/24 0458  NA 132* 133* 133* 133* 135 135  K 4.1 3.2* 4.3 4.1 4.2 4.5  CL 91* 93* 95* 96* 96* 96*  CO2 30 33* 33* 30 34* 33*  GLUCOSE 99 115* 84 98 109* 97  BUN 16 13 18 17 22 21   CREATININE 0.69 0.57* 0.50* 0.49* 0.51* 0.47*  CALCIUM  8.8* 8.4* 8.5* 8.6* 8.9 9.0  MG 2.1 2.1 1.8 2.1  --  1.9  PHOS  --  2.3*  --   --   --   --     GFR: Estimated Creatinine Clearance: 61.5 mL/min (A) (by C-G formula based on SCr of 0.47 mg/dL (L)).  Liver Function Tests: Recent Labs  Lab 03/29/24 0444  AST 20  ALT 15  ALKPHOS 46  BILITOT 0.9  PROT 5.8*  ALBUMIN 3.3*    BNP (last 3 results) Recent Labs    03/28/24 2018 03/29/24 0444  PROBNP 791.0* 810.0*  Radiology Studies: No results found.    LOS: 5 days   Tomeca Helm [employer] on [email or web address]  04/03/2024, 10:55 AM   "

## 2024-04-03 NOTE — Progress Notes (Signed)
 Physical Therapy Treatment Patient Details Name: Steven Guzman MRN: [id number] DOB: [birth year] Today's Date: 04/03/2024   History of Present Illness Pt is 84 year old presented to [facility] on  03/28/24 for chest pain and ICD firing. PMH - COPD, CHF, CAD MI, aortic aneurysm, ACDF,  chronic neck/back pain, anemia, depression, chronic idiopathic thrombocytopenia,    PT Comments  Pt reports feeling fatigued and shaky this morning, ambulating for shorter distances compared to previous PT session. Pt also demonstrates instability and reduced safety awareness when initially standing without support of DME and experiencing a lateral loss of balance. Pt will benefit from continued frequent mobilization in an effort to improve activity tolerance and to reduce risk for falls. Patient will benefit from continued inpatient follow up therapy, <3 hours/day.   If plan is discharge home, recommend the following: A little help with walking and/or transfers;A little help with bathing/dressing/bathroom;Assistance with cooking/housework;Assist for transportation;Help with stairs or ramp for entrance   Can travel by private vehicle     Yes  Equipment Recommendations  None recommended by PT    Recommendations for Other Services       Precautions / Restrictions Precautions Precautions: Fall Recall of Precautions/Restrictions: Intact Precaution/Restrictions Comments: watch O2, HR--on 3L at baseline Restrictions Weight Bearing Restrictions Per Provider Order: No     Mobility  Bed Mobility Overal bed mobility: Needs Assistance Bed Mobility: Supine to Sit, Sit to Supine     Supine to sit: Supervision Sit to supine: Min assist        Transfers Overall transfer level: Needs assistance Equipment used: Rollator (4 wheels) Transfers: Sit to/from Stand Sit to Stand: Min assist, Contact guard assist           General transfer comment: pt initially stands from bedside impulsively prior to having the  rollator in front of him. Pt with a lateral loss of balance when attempting to turn in the direction of the rollator, requiring PT assist to correct. With subsequent transfers onto and off of the rollator seat the pt is able to perform with CGA.    Ambulation/Gait Ambulation/Gait assistance: Contact guard assist Gait Distance (Feet): 50 Feet (50' x 2 trials) Assistive device: Rollator (4 wheels) Gait Pattern/deviations: Step-through pattern Gait velocity: reduced Gait velocity interpretation: <1.8 ft/sec, indicate of risk for recurrent falls   General Gait Details: slowed step-through gait   Stairs             Wheelchair Mobility     Tilt Bed    Modified Rankin (Stroke Patients Only)       Balance Overall balance assessment: Needs assistance Sitting-balance support: No upper extremity supported, Feet supported Sitting balance-Leahy Scale: Good     Standing balance support: Single extremity supported, Reliant on assistive device for balance Standing balance-Leahy Scale: Poor                              Communication Communication Communication: Impaired Factors Affecting Communication: Hearing impaired  Cognition Arousal: Alert Behavior During Therapy: WFL for tasks assessed/performed   PT - Cognitive impairments: No apparent impairments                         Following commands: Intact      Cueing Cueing Techniques: Verbal cues  Exercises      General Comments General comments (skin integrity, edema, etc.): SpO2 in mid-90s with ambulation while on 3L [place], SpO2  in low 90s on 2L [place] at rest      Pertinent Vitals/Pain Pain Assessment Pain Assessment: No/denies pain    Home Living                          Prior Function            PT Goals (current goals can now be found in the care plan section) Acute Rehab PT Goals Patient Stated Goal: go to rehab Progress towards PT goals: Progressing toward goals     Frequency    Min 2X/week      PT Plan      Co-evaluation              AM-PAC PT 6 Clicks Mobility   Outcome Measure  Help needed turning from your back to your side while in a flat bed without using bedrails?: A Little Help needed moving from lying on your back to sitting on the side of a flat bed without using bedrails?: A Little Help needed moving to and from a bed to a chair (including a wheelchair)?: A Little Help needed standing up from a chair using your arms (e.g., wheelchair or bedside chair)?: A Little Help needed to walk in hospital room?: A Little Help needed climbing 3-5 steps with a railing? : Total 6 Click Score: 16    End of Session Equipment Utilized During Treatment: Gait belt Activity Tolerance: Patient limited by fatigue Patient left: in bed;with call bell/phone within reach;with bed alarm set Nurse Communication: Mobility status PT Visit Diagnosis: Unsteadiness on feet (R26.81);Other abnormalities of gait and mobility (R26.89);Muscle weakness (generalized) (M62.81);Difficulty in walking, not elsewhere classified (R26.2)     Time: 9048-8981 PT Time Calculation (min) (ACUTE ONLY): 27 min  Charges:    $Gait Training: 8-22 mins $Therapeutic Activity: 8-22 mins PT General Charges $$ ACUTE PT VISIT: 1 Visit                     Bernardino JINNY Ruth, PT, DPT Acute Rehabilitation Office [phone]    Bernardino JINNY Ruth 04/03/2024, 11:36 AM

## 2024-04-03 NOTE — TOC Progression Note (Addendum)
 Transition of Care [facility]) - Progression Note    Patient Details  Name: Steven Guzman MRN: [id number] Date of Birth: 1940/08/26  [facility]) CM/SW Contact  Isaiah Public, LCSWA Phone Number: 04/03/2024, 11:18 AM  Clinical Narrative:     Patients insurance authorization currently pending for SNF Auth ID# [id number]. Patient has SNF bed at [facility]. CSW will continue to follow.  Update- CSW received call from [name] with [facility] regarding patients insurance authorization for SNF. [name] informed CSW that patients insurance authorization for SNF went to a peer to peer. CSW informed MD.patients insurance authorization for SNF went to a peer to peer. Tele# to call [phone] option 5. Deadline Tuesday 1/6 by 11:00am Eastern standard time. When you call you will need patients name,DOB, and subscriber # [id number] . CSW will continue to follow.   Expected Discharge Plan: Skilled Nursing Facility Barriers to Discharge: English As A Second Language Teacher, Continued Medical Work up, No SNF bed               Expected Discharge Plan and Services In-house Referral: Clinical Social Work   Post Acute Care Choice: Skilled Nursing Facility Living arrangements for the past 2 months: Single Family Home                                       Social Drivers of Health (SDOH) Interventions SDOH Screenings   Food Insecurity: No Food Insecurity (03/29/2024)  Housing: Low Risk (03/29/2024)  Transportation Needs: No Transportation Needs (03/29/2024)  Utilities: Not At Risk (03/29/2024)  Depression (PHQ2-9): Low Risk (03/12/2022)  Social Connections: Socially Integrated (03/29/2024)  Recent Concern: Social Connections - Moderately Isolated (03/13/2024)  Stress: No Stress Concern Present (12/27/2021)   Received from [facility], [facility] visits prior to 05/30/2022.  Tobacco Use: High Risk (03/29/2024)    Readmission Risk Interventions    03/14/2024     4:52 PM  Readmission Risk Prevention Plan  Transportation Screening Complete  Medication Review (RN Care Manager) Complete  HRI or Home Care Consult Complete  Palliative Care Screening Not Applicable  Skilled Nursing Facility Not Applicable

## 2024-04-03 NOTE — Progress Notes (Addendum)
 EP Tele Rounding Note    Patient Profile: 84 y/o M with PMH of VT, AF, AT, HTN, HLD, AAA, CAD s/p inferior MI 1995 s/p stent to RCA, PCI to LAD in 2011, ICM, HFrEF.  S/p ICD shock 03/27/24 at 3pm - pt reported missing 1-2 days of medications prior to event. Shock thought to be inappropriate for paroxysmal AT events.  His VT zone-1 starts at 110 bpm (likely programmed at that rate due to slow VT in past)    Tele Review: remains in SR 60's with artifact noted. No further AT events.     Daphne Barrack, NP-C, AGACNP-BC [facility] - Electrophysiology  04/03/2024, 8:58 AM

## 2024-04-04 LAB — URINALYSIS, ROUTINE W REFLEX MICROSCOPIC
Bilirubin Urine: NEGATIVE
Glucose, UA: >=500 mg/dL — AB
Hgb urine dipstick: NEGATIVE
Ketones, ur: NEGATIVE mg/dL
Leukocytes,Ua: NEGATIVE
Nitrite: NEGATIVE
Protein, ur: NEGATIVE mg/dL
Specific Gravity, Urine: 1.014 (ref 1.005–1.030)
pH: 8 (ref 5.0–8.0)

## 2024-04-04 NOTE — TOC Progression Note (Addendum)
 Transition of Care [facility]) - Progression Note    Patient Details  Name: Steven Guzman MRN: [id number] Date of Birth: 14-Feb-1941  Transition of Care [facility]) CM/SW Contact  Isaiah Public, LCSWA Phone Number: 04/04/2024, 12:40 PM  Clinical Narrative:     CSW was informed by patients insurance that peer to peer was denied for SNF. Patients insurance provided tele# for patient to call if patient wants to appeal. Tele# [phone] to start appeal. CSW provided patient with this information. Patient confirmed with CSW that he does want to appeal. Patient plans on calling telephone number provided by insurance and start appeal. CSW informed MD.CSW will continue to follow and assist with patients dc planning needs.  Update- Patient started appeal. Insurance informed patient that appeal can take up to 72 hours for determination. At the latest we should receive determination from appeal by Friday.   Expected Discharge Plan: Skilled Nursing Facility Barriers to Discharge: English As A Second Language Teacher, Continued Medical Work up, No SNF bed               Expected Discharge Plan and Services In-house Referral: Clinical Social Work   Post Acute Care Choice: Skilled Nursing Facility Living arrangements for the past 2 months: Single Family Home                                       Social Drivers of Health (SDOH) Interventions SDOH Screenings   Food Insecurity: No Food Insecurity (03/29/2024)  Housing: Low Risk (03/29/2024)  Transportation Needs: No Transportation Needs (03/29/2024)  Utilities: Not At Risk (03/29/2024)  Depression (PHQ2-9): Low Risk (03/12/2022)  Social Connections: Socially Integrated (03/29/2024)  Recent Concern: Social Connections - Moderately Isolated (03/13/2024)  Stress: No Stress Concern Present (12/27/2021)   Received from [facility], [facility] visits prior to 05/30/2022.  Tobacco Use: High Risk (03/29/2024)    Readmission Risk  Interventions    03/14/2024    4:52 PM  Readmission Risk Prevention Plan  Transportation Screening Complete  Medication Review (RN Care Manager) Complete  HRI or Home Care Consult Complete  Palliative Care Screening Not Applicable  Skilled Nursing Facility Not Applicable

## 2024-04-04 NOTE — Progress Notes (Signed)
 EP Tele Review:    Patient Profile: 84 y/o M with PMH of VT, AF, AT, HTN, HLD, AAA, CAD s/p inferior MI 1995 s/p stent to RCA, PCI to LAD in 2011, ICM, HFrEF. S/p ICD shock 03/27/24 at 3pm - pt reported missing 1-2 days of medications prior to event. Shock thought to be inappropriate for paroxysmal AT events. His VT zone-1 starts at 110 bpm (likely programmed at that rate due to slow VT in past)     Telemetry review of last 24 hours shows SR 70-80's, occ PVC's. Baseline artifact on EKG lead.  No further AT events.    Daphne Barrack, NP-C, AGACNP-BC [facility] - Electrophysiology  04/04/2024, 4:11 PM

## 2024-04-04 NOTE — Plan of Care (Signed)

## 2024-04-04 NOTE — Progress Notes (Signed)
 Mobility Specialist Progress Note:    04/04/24 1600  Mobility  Activity Ambulated with assistance  Level of Assistance Contact guard assist, steadying assist  Assistive Device Four wheel walker  Distance Ambulated (ft) 60 ft  Activity Response Tolerated well  Mobility Referral Yes  Mobility visit 1 Mobility  Mobility Specialist Start Time (ACUTE ONLY) 1352  Mobility Specialist Stop Time (ACUTE ONLY) 1410  Mobility Specialist Time Calculation (min) (ACUTE ONLY) 18 min   Received pt in chair having no complaints and agreeable to mobility. Pt was asymptomatic throughout session. Ambulated on 3L/min, VSS. Returned to room w/o fault. Left in chair w/ call bell in reach and all needs met.   Thersia Minder Mobility Specialist  Please contact vis Secure Chat or  Rehab Office [phone]

## 2024-04-04 NOTE — Progress Notes (Signed)
 "  [facility] PROGRESS NOTE   Steven Guzman FMW:[id number] DOB: [birth year] DOA: 03/28/2024  PCP: Esmeralda Morton SAUNDERS, PA-C  Brief History: 84 y.o. male with medical history significant for paroxysmal ventricular tachycardia, ICD placement 2010, VT ablation in 2016, paroxysmal atrial fibrillation chronically anticoagulated on Eliquis , CAD, chronic systolic heart failure in the setting of ischemic cardiomyopathy, COPD, chronic hypoxic respiratory failure on 3 L continuous nasal cannula at baseline, anemia of chronic disease associated baseline hemoglobin 10-12, who is admitted to [facility] on 03/28/2024 with paroxysmal ventricular tachycardia after presenting from home to [facility] ED complaining of ICD having fired.  Patient was recently hospitalized for pneumonia and discharged on 12/26.  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 Patient was hospitalized for further management.  Cardiology was consulted.  Consultants: Cardiology  Procedures: Setting changes to ICD    Subjective/Interval History: Patient feels well.  No new complaints offered.  Very disappointed that his insurance denied short-term rehab at [facility].  He plans to appeal.    Assessment/Plan:  Ventricular tachycardia Patient has ICD in situ.  Apparently his ICD fired and he presented to the emergency department.  No further episodes in the hospital.  Patient was seen by cardiology and placed on amiodarone  infusion. Patient was seen by electrophysiology.  Setting changes were made to his ICD.  Cardiology has signed off.   Amiodarone  changed back to oral.  Also on mexiletine.  Hypokalemia Improved with supplementation.  Monitor periodically.  Generalized weakness No focal neurological deficits noted.  Recent hospitalization for pneumonia noted.  SNF or rehab was recommended at that time but he elected to go home.  Seen by PT and OT.  Short-term rehab was  recommended.    Acute on chronic respiratory failure with hypoxia Uses oxygen  at home at 3 L/min.  Continue for now.  Saturations are stable.  Respiratory effort is normal.  Paroxysmal atrial fibrillation Stable.  Noted to be on apixaban .  Changed over from carvedilol  to metoprolol .  Metoprolol  dose was decreased due to low blood pressures which have stabilized.  Chronic systolic CHF Based on echo from early December his LVEF is 35 to 40%.  RV function was noted to be mildly reduced.  Diastolic parameters could not be assessed. Volume status is stable. He was on furosemide , metoprolol , Jardiance .  Due to low blood pressures his furosemide  dose has been decreased.  Metoprolol  dose also decreased.  Continue Jardiance .  Blood pressure has stabilized.  Can be monitored at skilled nursing facility.   Other GDMT will be deferred to cardiology at follow-up. Check electrolytes in the morning.  History of COPD Stable.  Anemia of chronic disease Stable hemoglobin noted.  No evidence for overt bleeding.  Wounds Wound type: Scalp; skin tear; flap lost; clean; bloody Right upper arm; skin tear; flap loose and non-viable; wound clean  Right buttock; DTPI; dark purple non-blanchable  Left buttock; CSD-chronic skin damage, Stage 1 discoloration; red Sacrum: Stage 3 Pressure Injury; 75% yellow/white-25% pink  Right elbow; skin tear; skin flap lost; yellow drainage Pressure Injury POA: Yes SNF will assess sacrum and determine appropriate treatment.   DVT Prophylaxis: Eliquis  Code Status: Full code Family Communication: Discussed with patient Disposition Plan: Await SNF.    Medications: Scheduled:  amiodarone   200 mg Oral Daily   apixaban   5 mg Oral BID   arformoterol   15 mcg Nebulization BID   And   umeclidinium  bromide  1 puff Inhalation Daily   capsaicin    Topical BID   collagenase    Topical Daily   empagliflozin   10 mg Oral Daily   escitalopram   20 mg Oral Daily   feeding  supplement  237 mL Oral TID BM   furosemide   20 mg Oral Daily   magnesium  oxide  400 mg Oral BID   metoprolol  succinate  12.5 mg Oral QHS   mexiletine  200 mg Oral Q12H   multivitamin with minerals  1 tablet Oral Daily   nutrition supplement (JUVEN)  1 packet Oral BID BM   polyethylene glycol  17 g Oral BID   pregabalin   25 mg Oral BID   senna-docusate  2 tablet Oral BID   tamsulosin   0.4 mg Oral Daily   Continuous:   PRN:acetaminophen  **OR** acetaminophen , ALPRAZolam , hydrocortisone  cream, levalbuterol , melatonin, ondansetron  (ZOFRAN ) IV  Objective:  Vital Signs  Vitals:   04/04/24 0358 04/04/24 0817 04/04/24 0842 04/04/24 0844  BP: 117/61 128/73    Pulse: 60 64    Resp: 20 20    Temp: 98.5 F (36.9 C) 98.2 F (36.8 C)    TempSrc: Oral Oral    SpO2: 98% 97% 99% 100%  Weight: 63.4 kg     Height:        Intake/Output Summary (Last 24 hours) at 04/04/2024 1025 Last data filed at 04/04/2024 9378 Gross per 24 hour  Intake 635 ml  Output 1475 ml  Net -840 ml   Filed Weights   04/02/24 0511 04/03/24 0520 04/04/24 0358  Weight: 62.1 kg 64.4 kg 63.4 kg    General appearance: Awake alert.  In no distress Resp: Clear to auscultation bilaterally.  Normal effort Cardio: S1-S2 is normal regular.  No S3-S4.  No rubs murmurs or bruit GI: Abdomen is soft.  Nontender nondistended.  Bowel sounds are present normal.  No masses organomegaly   Lab Results:  Data Reviewed: I have personally reviewed following labs and reports of the imaging studies  CBC: Recent Labs  Lab 03/28/24 1553 03/29/24 0444 03/31/24 0342 04/03/24 0458  WBC 4.8 4.2 5.6 4.3  NEUTROABS 3.5 2.5  --   --   HGB 12.5* 11.2* 10.2* 10.3*  HCT 40.8 37.6* 33.6* 33.7*  MCV 98.3 99.2 99.1 98.8  PLT 175 146* 134* 117*    Basic Metabolic Panel: Recent Labs  Lab 03/28/24 1553 03/29/24 0444 03/30/24 0632 03/31/24 0342 04/01/24 0451 04/03/24 0458  NA 132* 133* 133* 133* 135 135  K 4.1 3.2* 4.3 4.1 4.2  4.5  CL 91* 93* 95* 96* 96* 96*  CO2 30 33* 33* 30 34* 33*  GLUCOSE 99 115* 84 98 109* 97  BUN 16 13 18 17 22 21   CREATININE 0.69 0.57* 0.50* 0.49* 0.51* 0.47*  CALCIUM  8.8* 8.4* 8.5* 8.6* 8.9 9.0  MG 2.1 2.1 1.8 2.1  --  1.9  PHOS  --  2.3*  --   --   --   --     GFR: Estimated Creatinine Clearance: 60.5 mL/min (A) (by C-G formula based on SCr of 0.47 mg/dL (L)).  Liver Function Tests: Recent Labs  Lab 03/29/24 0444  AST 20  ALT 15  ALKPHOS 46  BILITOT 0.9  PROT 5.8*  ALBUMIN 3.3*    BNP (last 3 results) Recent Labs    03/28/24 2018 03/29/24 0444  PROBNP 791.0* 810.0*     Radiology Studies: No results found.    LOS: 6 days   Takelia Urieta  Verdene  [email or web address]  04/04/2024, 10:25 AM   "

## 2024-04-05 DIAGNOSIS — I472 Ventricular tachycardia, unspecified: Secondary | ICD-10-CM | POA: Diagnosis not present

## 2024-04-05 DIAGNOSIS — R627 Adult failure to thrive: Secondary | ICD-10-CM | POA: Diagnosis not present

## 2024-04-05 DIAGNOSIS — R5381 Other malaise: Secondary | ICD-10-CM | POA: Diagnosis not present

## 2024-04-05 LAB — CBC
HCT: 33.4 % — ABNORMAL LOW (ref 39.0–52.0)
Hemoglobin: 10.3 g/dL — ABNORMAL LOW (ref 13.0–17.0)
MCH: 30.7 pg (ref 26.0–34.0)
MCHC: 30.8 g/dL (ref 30.0–36.0)
MCV: 99.4 fL (ref 80.0–100.0)
Platelets: 97 K/uL — ABNORMAL LOW (ref 150–400)
RBC: 3.36 MIL/uL — ABNORMAL LOW (ref 4.22–5.81)
RDW: 22.1 % — ABNORMAL HIGH (ref 11.5–15.5)
WBC: 3.9 K/uL — ABNORMAL LOW (ref 4.0–10.5)
nRBC: 0 % (ref 0.0–0.2)

## 2024-04-05 LAB — BASIC METABOLIC PANEL WITH GFR
Anion gap: 6 (ref 5–15)
BUN: 21 mg/dL (ref 8–23)
CO2: 33 mmol/L — ABNORMAL HIGH (ref 22–32)
Calcium: 8.9 mg/dL (ref 8.9–10.3)
Chloride: 95 mmol/L — ABNORMAL LOW (ref 98–111)
Creatinine, Ser: 0.49 mg/dL — ABNORMAL LOW (ref 0.61–1.24)
GFR, Estimated: >60 mL/min
Glucose, Bld: 95 mg/dL (ref 70–99)
Potassium: 4.2 mmol/L (ref 3.5–5.1)
Sodium: 134 mmol/L — ABNORMAL LOW (ref 135–145)

## 2024-04-05 MED ORDER — BISACODYL 10 MG RE SUPP: 10.0000 mg | Freq: Every day | RECTAL | Status: DC | PRN

## 2024-04-05 NOTE — TOC Progression Note (Addendum)
 Transition of Care [facility]) - Progression Note    Patient Details  Name: ABSALOM ARO MRN: [id number] Date of Birth: 13-Oct-1940  [facility] CM/SW Contact  Isaiah Public, LCSWA Phone Number: 04/05/2024, 9:55 AM  Clinical Narrative:     Appeal started yesterday for SNF. Appeal currently pending. CSW will continue to follow. CSW updated Cheree with [facility] in admissions.  Expected Discharge Plan: Skilled Nursing Facility Barriers to Discharge: English As A Second Language Teacher, Continued Medical Work up, No SNF bed               Expected Discharge Plan and Services In-house Referral: Clinical Social Work   Post Acute Care Choice: Skilled Nursing Facility Living arrangements for the past 2 months: Single Family Home                                       Social Drivers of Health (SDOH) Interventions SDOH Screenings   Food Insecurity: No Food Insecurity (03/29/2024)  Housing: Low Risk (03/29/2024)  Transportation Needs: No Transportation Needs (03/29/2024)  Utilities: Not At Risk (03/29/2024)  Depression (PHQ2-9): Low Risk (03/12/2022)  Social Connections: Socially Integrated (03/29/2024)  Recent Concern: Social Connections - Moderately Isolated (03/13/2024)  Stress: No Stress Concern Present (12/27/2021)   Received from [facility], [facility] visits prior to 05/30/2022.  Tobacco Use: High Risk (03/29/2024)    Readmission Risk Interventions    03/14/2024    4:52 PM  Readmission Risk Prevention Plan  Transportation Screening Complete  Medication Review (RN Care Manager) Complete  HRI or Home Care Consult Complete  Palliative Care Screening Not Applicable  Skilled Nursing Facility Not Applicable

## 2024-04-05 NOTE — Progress Notes (Signed)
 "  [facility] PROGRESS NOTE   Steven Guzman FMW:[id number] DOB: 05-Apr-1940 DOA: 03/28/2024  PCP: Esmeralda Morton SAUNDERS, PA-C  Brief History: 84 y.o. male with medical history significant for paroxysmal ventricular tachycardia, ICD placement 2010, VT ablation in 2016, paroxysmal atrial fibrillation chronically anticoagulated on Eliquis , CAD, chronic systolic heart failure in the setting of ischemic cardiomyopathy, COPD, chronic hypoxic respiratory failure on 3 L continuous nasal cannula at baseline, anemia of chronic disease associated baseline hemoglobin 10-12, who is admitted to [facility] on 03/28/2024 with paroxysmal ventricular tachycardia after presenting from home to [facility] ED complaining of ICD having fired.  Patient was recently hospitalized for pneumonia and discharged on 12/26.  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 Patient was hospitalized for further management.  Cardiology was consulted.  Consultants: Cardiology  Procedures: Setting changes to ICD    Subjective/Interval History: Has not heard from insurance company yet  Assessment/Plan:  Ventricular tachycardia Patient has ICD in situ.  Apparently his ICD fired and he presented to the emergency department.  No further episodes in the hospital.  Patient was seen by cardiology and placed on amiodarone  infusion. Patient was seen by electrophysiology.  Setting changes were made to his ICD.  Cardiology has signed off.   Amiodarone  changed back to oral.  Also on mexiletine.  Hypokalemia Improved with supplementation.  Monitor periodically.  Generalized weakness No focal neurological deficits noted.  Recent hospitalization for pneumonia noted.  SNF or rehab was recommended at that time but he elected to go home.  Seen by PT and OT.  Short-term rehab was recommended-patient appealing denial  Acute on chronic respiratory failure with hypoxia Uses  oxygen  at home at 3 L/min.  Continue for now.  Saturations are stable.  Respiratory effort is normal.  Paroxysmal atrial fibrillation Stable.  Noted to be on apixaban .  Changed over from carvedilol  to metoprolol .  Metoprolol  dose was decreased due to low blood pressures which have stabilized.  Chronic systolic CHF Based on echo from early December his LVEF is 35 to 40%.  RV function was noted to be mildly reduced.  Diastolic parameters could not be assessed. Volume status is stable. He was on furosemide , metoprolol , Jardiance .  Due to low blood pressures his furosemide  dose has been decreased.  Metoprolol  dose also decreased.  Continue Jardiance .  Blood pressure has stabilized.  Can be monitored at skilled nursing facility.   Other GDMT will be deferred to cardiology at follow-up. Check electrolytes in the morning.  History of COPD Stable.  Anemia of chronic disease Stable hemoglobin noted.  No evidence for overt bleeding.  Wounds SNF will assess sacrum and determine appropriate treatment.  Wound 03/29/24 1236 Pressure Injury Sacrum Stage 3 -  Full thickness tissue loss. Subcutaneous fat may be visible but bone, tendon or muscle are NOT exposed. (Active)     Wound 03/29/24 1239 Pressure Injury Buttocks Right Deep Tissue Pressure Injury - Purple or maroon localized area of discolored intact skin or blood-filled blister due to damage of underlying soft tissue from pressure and/or shear. (Active)     Wound 03/29/24 1239 Pressure Injury Buttocks Left Stage 1 -  Intact skin with non-blanchable redness of a localized area usually over a bony prominence. (Active)      DVT Prophylaxis: Eliquis  Code Status: Full code Family Communication: Discussed with patient Disposition Plan: Await SNF-patient is appealing denial    Medications: Scheduled:  amiodarone   200 mg Oral Daily   apixaban   5 mg Oral BID   arformoterol   15 mcg Nebulization BID   And   umeclidinium bromide   1 puff Inhalation  Daily   capsaicin    Topical BID   collagenase    Topical Daily   empagliflozin   10 mg Oral Daily   escitalopram   20 mg Oral Daily   feeding supplement  237 mL Oral TID BM   furosemide   20 mg Oral Daily   magnesium  oxide  400 mg Oral BID   metoprolol  succinate  12.5 mg Oral QHS   mexiletine  200 mg Oral Q12H   multivitamin with minerals  1 tablet Oral Daily   nutrition supplement (JUVEN)  1 packet Oral BID BM   polyethylene glycol  17 g Oral BID   pregabalin   25 mg Oral BID   senna-docusate  2 tablet Oral BID   tamsulosin   0.4 mg Oral Daily   Continuous:   PRN:acetaminophen  **OR** acetaminophen , ALPRAZolam , bisacodyl , hydrocortisone  cream, levalbuterol , melatonin, ondansetron  (ZOFRAN ) IV  Objective:  Vital Signs  Vitals:   04/05/24 0536 04/05/24 0811 04/05/24 0837 04/05/24 1215  BP: 113/68 113/64  124/62  Pulse: 65 67  65  Resp: 17 16 16 16   Temp: 98.3 F (36.8 C) 98.6 F (37 C)  98.4 F (36.9 C)  TempSrc: Oral Oral  Oral  SpO2: 94% 98%  100%  Weight: 63.3 kg     Height:        Intake/Output Summary (Last 24 hours) at 04/05/2024 1221 Last data filed at 04/05/2024 1006 Gross per 24 hour  Intake 818 ml  Output 750 ml  Net 68 ml   Filed Weights   04/03/24 0520 04/04/24 0358 04/05/24 0536  Weight: 64.4 kg 63.4 kg 63.3 kg     General: Appearance:    Thin male in no acute distress     Lungs:      respirations unlabored  Heart:    Normal heart rate.   MS:   All extremities are intact.   Neurologic:   Awake, alert      Lab Results:  Data Reviewed: I have personally reviewed following labs and reports of the imaging studies  CBC: Recent Labs  Lab 03/31/24 0342 04/03/24 0458 04/05/24 0425  WBC 5.6 4.3 3.9*  HGB 10.2* 10.3* 10.3*  HCT 33.6* 33.7* 33.4*  MCV 99.1 98.8 99.4  PLT 134* 117* 97*    Basic Metabolic Panel: Recent Labs  Lab 03/30/24 0632 03/31/24 0342 04/01/24 0451 04/03/24 0458 04/05/24 0425  NA 133* 133* 135 135 134*  K 4.3 4.1 4.2  4.5 4.2  CL 95* 96* 96* 96* 95*  CO2 33* 30 34* 33* 33*  GLUCOSE 84 98 109* 97 95  BUN 18 17 22 21 21   CREATININE 0.50* 0.49* 0.51* 0.47* 0.49*  CALCIUM  8.5* 8.6* 8.9 9.0 8.9  MG 1.8 2.1  --  1.9  --     GFR: Estimated Creatinine Clearance: 60.4 mL/min (A) (by C-G formula based on SCr of 0.49 mg/dL (L)).  Liver Function Tests: No results for input(s): AST, ALT, ALKPHOS, BILITOT, PROT, ALBUMIN in the last 168 hours.   BNP (last 3 results) Recent Labs    03/28/24 2018 03/29/24 0444  PROBNP 791.0* 810.0*     Radiology Studies: No results found.    LOS: 7 days   Harlene RAYMOND Bowl  [facility] Pager on [email or web address]  04/05/2024, 12:21 PM   "

## 2024-04-05 NOTE — Plan of Care (Signed)

## 2024-04-05 NOTE — Progress Notes (Signed)
 Physical Therapy Treatment Patient Details Name: Steven Guzman MRN: [id number] DOB: 02-09-1941 Today's Date: 04/05/2024   History of Present Illness Pt is 84 year old presented to [facility] on  03/28/24 for chest pain and ICD firing. PMH - COPD, CHF, CAD MI, aortic aneurysm, ACDF,  chronic neck/back pain, anemia, depression, chronic idiopathic thrombocytopenia,    PT Comments  Pt received up in recliner on arrival, agreeable to session, however limited by pt stated fatigue from being up in the chair for most of day. Pt able to come to stand from recliner with CGA for safety and perform short in room ambulation with rollator support with CGA for safety. Pt declining hallway gait due to fatigue. Pt performing x5 serial sit<>stands from EOB with CGA for safety with cues for scoot out to EOB prior to standing. Pt supine in bed at end of session with all needs met. Continued education on importance of frequent mobilization throughout day to maximize functional mobility progression and for increased activity tolerance with pt verbalizing understanding. Pt continues to benefit from skilled PT services to progress toward functional mobility goals.      If plan is discharge home, recommend the following: A little help with walking and/or transfers;A little help with bathing/dressing/bathroom;Assistance with cooking/housework;Assist for transportation;Help with stairs or ramp for entrance   Can travel by private vehicle     Yes  Equipment Recommendations  None recommended by PT    Recommendations for Other Services       Precautions / Restrictions Precautions Precautions: Fall Recall of Precautions/Restrictions: Intact Precaution/Restrictions Comments: watch O2, HR--on 3L at baseline Restrictions Weight Bearing Restrictions Per Provider Order: No     Mobility  Bed Mobility Overal bed mobility: Needs Assistance Bed Mobility: Sit to Supine       Sit to supine: Min assist   General bed  mobility comments: min A to return LEs to bed at end of session, pt able to lower trunk to supine    Transfers Overall transfer level: Needs assistance Equipment used: Rollator (4 wheels) Transfers: Sit to/from Stand Sit to Stand: Contact guard assist           General transfer comment: pt standing from recliner x1 and EOB x5, CGA for safety, good hand placement    Ambulation/Gait Ambulation/Gait assistance: Contact guard assist Gait Distance (Feet): 18 Feet Assistive device: Rollator (4 wheels) Gait Pattern/deviations: Step-through pattern Gait velocity: reduced     General Gait Details: slowed step-through gait no LOB, declining hallway ambulation with pt stating he is too fatigued from sitting up in the recliner throughout day   Stairs             Wheelchair Mobility     Tilt Bed    Modified Rankin (Stroke Patients Only)       Balance Overall balance assessment: Needs assistance Sitting-balance support: No upper extremity supported, Feet supported Sitting balance-Leahy Scale: Good Sitting balance - Comments: seated EOB for LE exercises without LOB   Standing balance support: Single extremity supported, Reliant on assistive device for balance Standing balance-Leahy Scale: Poor Standing balance comment: reliant on external support                            Communication Communication Communication: Impaired Factors Affecting Communication: Hearing impaired  Cognition Arousal: Alert Behavior During Therapy: WFL for tasks assessed/performed   PT - Cognitive impairments: No apparent impairments  PT - Cognition Comments: Needs cues for pacing and to stop talking to catch his breath Following commands: Intact      Cueing Cueing Techniques: Verbal cues  Exercises Other Exercises Other Exercises: 5x serial sit<>stand    General Comments General comments (skin integrity, edema, etc.): VSS on supplemental  O2      Pertinent Vitals/Pain Pain Assessment Pain Assessment: Faces Faces Pain Scale: Hurts a little bit Pain Location: sacrum from sitting up in chair Pain Descriptors / Indicators: Aching Pain Intervention(s): Monitored during session, Limited activity within patient's tolerance    Home Living                          Prior Function            PT Goals (current goals can now be found in the care plan section) Acute Rehab PT Goals Patient Stated Goal: go to rehab PT Goal Formulation: With patient Time For Goal Achievement: 04/12/24 Progress towards PT goals: Progressing toward goals    Frequency    Min 2X/week      PT Plan      Co-evaluation              AM-PAC PT 6 Clicks Mobility   Outcome Measure  Help needed turning from your back to your side while in a flat bed without using bedrails?: A Little Help needed moving from lying on your back to sitting on the side of a flat bed without using bedrails?: A Little Help needed moving to and from a bed to a chair (including a wheelchair)?: A Little Help needed standing up from a chair using your arms (e.g., wheelchair or bedside chair)?: A Little Help needed to walk in hospital room?: A Little Help needed climbing 3-5 steps with a railing? : Total 6 Click Score: 16    End of Session Equipment Utilized During Treatment: Gait belt Activity Tolerance: Patient limited by fatigue Patient left: in bed;with call bell/phone within reach;with bed alarm set Nurse Communication: Mobility status PT Visit Diagnosis: Unsteadiness on feet (R26.81);Other abnormalities of gait and mobility (R26.89);Muscle weakness (generalized) (M62.81);Difficulty in walking, not elsewhere classified (R26.2)     Time: 8646-8582 PT Time Calculation (min) (ACUTE ONLY): 24 min  Charges:    $Therapeutic Exercise: 8-22 mins $Therapeutic Activity: 8-22 mins PT General Charges $$ ACUTE PT VISIT: 1 Visit                      Jadore Mcguffin R. PTA Acute Rehabilitation Services Office: [phone]   Therisa CHRISTELLA Boor 04/05/2024, 2:22 PM

## 2024-04-05 NOTE — Progress Notes (Signed)
 Occupational Therapy Treatment Patient Details Name: Steven Guzman MRN: [id number] DOB: Jul 17, 1940 Today's Date: 04/05/2024   History of present illness Pt is 84 year old presented to [facility] on  03/28/24 for chest pain and ICD firing. PMH - COPD, CHF, CAD MI, aortic aneurysm, ACDF,  chronic neck/back pain, anemia, depression, chronic idiopathic thrombocytopenia,   OT comments  Patient agreeable to participate in OT intervention this date.  Patient completed supine to sitting EOB with min A with HOB elevated, sit to stand with mind A and  transfer to bedside chair using RW and minA for overall safety to chair.  Patient able to completed UB grooming while seated in bedside chair with Supervision s/p setup of task.  Patient would benefit from additional OT intervention to address functional deficits that remain in order for patient to return to PLOF.  Patient continues to require skilled OT intervention for overall activity tolerance, strength, fall prevention and Indep in ADL tasks and would benefit from further rehab upon DC      If plan is discharge home, recommend the following:  A little help with walking and/or transfers;A lot of help with bathing/dressing/bathroom;Assistance with cooking/housework;Assist for transportation;Help with stairs or ramp for entrance   Equipment Recommendations  None recommended by OT    Recommendations for Other Services      Precautions / Restrictions Precautions Precautions: Fall Recall of Precautions/Restrictions: Intact Precaution/Restrictions Comments: watch O2, HR--on 3L at baseline Restrictions Weight Bearing Restrictions Per Provider Order: No       Mobility Bed Mobility Overal bed mobility: Needs Assistance Bed Mobility: Sit to Supine       Sit to supine: Min assist        Transfers Overall transfer level: Needs assistance Equipment used: Rollator (4 wheels) Transfers: Sit to/from Stand Sit to Stand: Min assist                  Balance Overall balance assessment: Needs assistance Sitting-balance support: No upper extremity supported, Feet supported Sitting balance-Leahy Scale: Good         Standing balance comment: reliant on external support                           ADL either performed or assessed with clinical judgement   ADL Overall ADL's : Needs assistance/impaired     Grooming: Oral care;Wash/dry face;Sitting;Set up               Lower Body Dressing: Maximal assistance (for donning socks while seated EOB)               Functional mobility during ADLs: Minimal assistance;Rolling walker (2 wheels)      Extremity/Trunk Assessment              Vision       Perception     Praxis     Communication Communication Communication: Impaired Factors Affecting Communication: Hearing impaired   Cognition Arousal: Alert Behavior During Therapy: WFL for tasks assessed/performed                                          Cueing      Exercises      Shoulder Instructions       General Comments VSS on supplemental O2    Pertinent Vitals/ Pain       Pain  Assessment Pain Assessment: No/denies pain  Home Living                                          Prior Functioning/Environment              Frequency  Min 2X/week        Progress Toward Goals  OT Goals(current goals can now be found in the care plan section)  Progress towards OT goals: Progressing toward goals  Acute Rehab OT Goals OT Goal Formulation: With patient Time For Goal Achievement: 04/12/24 Potential to Achieve Goals: Good ADL Goals Pt Will Perform Grooming: with supervision;standing Pt Will Perform Upper Body Bathing: with modified independence;sitting Pt Will Perform Lower Body Bathing: with supervision;sit to/from stand Pt Will Perform Upper Body Dressing: with modified independence;sitting Pt Will Perform Lower Body Dressing: sit to/from  stand;with supervision Pt Will Transfer to Toilet: with supervision;ambulating;bedside commode Pt Will Perform Toileting - Clothing Manipulation and hygiene: with supervision;sit to/from stand Additional ADL Goal #1: Pt will complete bed mobility mod I. Additional ADL Goal #2: Pt will implement energy conservation and pursed lip breathing in ADLs and mobility.  Plan      Co-evaluation                 AM-PAC OT 6 Clicks Daily Activity     Outcome Measure   Help from another person eating meals?: None Help from another person taking care of personal grooming?: A Little Help from another person toileting, which includes using toliet, bedpan, or urinal?: A Little Help from another person bathing (including washing, rinsing, drying)?: A Lot Help from another person to put on and taking off regular upper body clothing?: A Little Help from another person to put on and taking off regular lower body clothing?: A Lot 6 Click Score: 17    End of Session    OT Visit Diagnosis: Unsteadiness on feet (R26.81);Muscle weakness (generalized) (M62.81);Other (comment)   Activity Tolerance Patient tolerated treatment well   Patient Left in chair;with call bell/phone within reach;with chair alarm set   Nurse Communication Mobility status        Time: 9047-8982 OT Time Calculation (min): 25 min  Charges: OT General Charges $OT Visit: 1 Visit OT Treatments $Self Care/Home Management : 23-37 mins  Lamarr Pouch OT/L  Steven Guzman 04/05/2024, 3:19 PM

## 2024-04-06 DIAGNOSIS — I472 Ventricular tachycardia, unspecified: Secondary | ICD-10-CM | POA: Diagnosis not present

## 2024-04-06 NOTE — Progress Notes (Signed)
 "  [facility] PROGRESS NOTE   KNOWLEDGE ESCANDON FMW:[id number] DOB: 07/24/1940 DOA: 03/28/2024  PCP: Esmeralda Morton SAUNDERS, PA-C  Brief History: 84 y.o. male with medical history significant for paroxysmal ventricular tachycardia, ICD placement 2010, VT ablation in 2016, paroxysmal atrial fibrillation chronically anticoagulated on Eliquis , CAD, chronic systolic heart failure in the setting of ischemic cardiomyopathy, COPD, chronic hypoxic respiratory failure on 3 L continuous nasal cannula at baseline, anemia of chronic disease associated baseline hemoglobin 10-12, who is admitted to [facility] on 03/28/2024 with paroxysmal ventricular tachycardia after presenting from home to [facility] ED complaining of ICD having fired.  Patient was recently hospitalized for pneumonia and discharged on 12/26.  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 Patient was hospitalized for further management.  Cardiology was consulted.  Consultants: Cardiology  Procedures: Setting changes to ICD    Subjective/Interval History: Taking bath-- has not yet heard from insurance agency  Assessment/Plan:  Ventricular tachycardia Patient has ICD in situ.  Apparently his ICD fired and he presented to the emergency department.  No further episodes in the hospital.  Patient was seen by cardiology and placed on amiodarone  infusion. Patient was seen by electrophysiology.  Setting changes were made to his ICD.  Cardiology has signed off.   Amiodarone  changed back to oral.  Also on mexiletine.  Hypokalemia Improved with supplementation.  Monitor periodically.  Generalized weakness No focal neurological deficits noted.  Recent hospitalization for pneumonia noted.  SNF or rehab was recommended at that time but he elected to go home.  Seen by PT and OT.  Short-term rehab was recommended-patient appealing denial  Acute on chronic respiratory failure with  hypoxia Uses oxygen  at home at 3 L/min.  Continue for now.  Saturations are stable.  Respiratory effort is normal.  Paroxysmal atrial fibrillation Stable.  Noted to be on apixaban .  Changed over from carvedilol  to metoprolol .  Metoprolol  dose was decreased due to low blood pressures which have stabilized.  Chronic systolic CHF Based on echo from early December his LVEF is 35 to 40%.  RV function was noted to be mildly reduced.  Diastolic parameters could not be assessed. Volume status is stable. He was on furosemide , metoprolol , Jardiance .  Due to low blood pressures his furosemide  dose has been decreased.  Metoprolol  dose also decreased.  Continue Jardiance .  Blood pressure has stabilized.  Can be monitored at skilled nursing facility.   Other GDMT will be deferred to cardiology at follow-up.  History of COPD Stable.  Anemia of chronic disease Stable hemoglobin noted.  No evidence for overt bleeding.  Wounds SNF will assess sacrum and determine appropriate treatment.  Wound 03/29/24 1236 Pressure Injury Sacrum Stage 3 -  Full thickness tissue loss. Subcutaneous fat may be visible but bone, tendon or muscle are NOT exposed. (Active)     Wound 03/29/24 1239 Pressure Injury Buttocks Right Deep Tissue Pressure Injury - Purple or maroon localized area of discolored intact skin or blood-filled blister due to damage of underlying soft tissue from pressure and/or shear. (Active)     Wound 03/29/24 1239 Pressure Injury Buttocks Left Stage 1 -  Intact skin with non-blanchable redness of a localized area usually over a bony prominence. (Active)      DVT Prophylaxis: Eliquis  Code Status: Full code Family Communication: Discussed with patient Disposition Plan: Await SNF-patient is appealing denial    Medications: Scheduled:  amiodarone   200 mg  Oral Daily   apixaban   5 mg Oral BID   arformoterol   15 mcg Nebulization BID   And   umeclidinium bromide   1 puff Inhalation Daily   capsaicin     Topical BID   collagenase    Topical Daily   empagliflozin   10 mg Oral Daily   escitalopram   20 mg Oral Daily   feeding supplement  237 mL Oral TID BM   furosemide   20 mg Oral Daily   magnesium  oxide  400 mg Oral BID   metoprolol  succinate  12.5 mg Oral QHS   mexiletine  200 mg Oral Q12H   multivitamin with minerals  1 tablet Oral Daily   nutrition supplement (JUVEN)  1 packet Oral BID BM   polyethylene glycol  17 g Oral BID   pregabalin   25 mg Oral BID   senna-docusate  2 tablet Oral BID   tamsulosin   0.4 mg Oral Daily   Continuous:   PRN:acetaminophen  **OR** acetaminophen , ALPRAZolam , bisacodyl , hydrocortisone  cream, levalbuterol , melatonin, ondansetron  (ZOFRAN ) IV  Objective:  Vital Signs  Vitals:   04/05/24 2241 04/06/24 0050 04/06/24 0521 04/06/24 1007  BP: 111/65 100/67 102/64 (!) 123/95  Pulse:  62 63   Resp:  16 18 17   Temp:  98 F (36.7 C) 98.1 F (36.7 C) (!) 96.4 F (35.8 C)  TempSrc:  Oral Oral Axillary  SpO2:  97% 98% 97%  Weight:   65 kg   Height:        Intake/Output Summary (Last 24 hours) at 04/06/2024 1133 Last data filed at 04/06/2024 0500 Gross per 24 hour  Intake 120 ml  Output 1350 ml  Net -1230 ml   Filed Weights   04/04/24 0358 04/05/24 0536 04/06/24 0521  Weight: 63.4 kg 63.3 kg 65 kg     General: Appearance:    Thin male in no acute distress     Lungs:      respirations unlabored  Heart:    Normal heart rate.   MS:   All extremities are intact.   Neurologic:   Awake, alert      Lab Results:  Data Reviewed: I have personally reviewed following labs and reports of the imaging studies  CBC: Recent Labs  Lab 03/31/24 0342 04/03/24 0458 04/05/24 0425  WBC 5.6 4.3 3.9*  HGB 10.2* 10.3* 10.3*  HCT 33.6* 33.7* 33.4*  MCV 99.1 98.8 99.4  PLT 134* 117* 97*    Basic Metabolic Panel: Recent Labs  Lab 03/31/24 0342 04/01/24 0451 04/03/24 0458 04/05/24 0425  NA 133* 135 135 134*  K 4.1 4.2 4.5 4.2  CL 96* 96* 96* 95*  CO2  30 34* 33* 33*  GLUCOSE 98 109* 97 95  BUN 17 22 21 21   CREATININE 0.49* 0.51* 0.47* 0.49*  CALCIUM  8.6* 8.9 9.0 8.9  MG 2.1  --  1.9  --     GFR: Estimated Creatinine Clearance: 62.1 mL/min (A) (by C-G formula based on SCr of 0.49 mg/dL (L)).  Liver Function Tests: No results for input(s): AST, ALT, ALKPHOS, BILITOT, PROT, ALBUMIN in the last 168 hours.   BNP (last 3 results) Recent Labs    03/28/24 2018 03/29/24 0444  PROBNP 791.0* 810.0*     Radiology Studies: No results found.    LOS: 8 days   Harlene RAYMOND Bowl  [facility] Pager on [email or web address]  04/06/2024, 11:33 AM   "

## 2024-04-06 NOTE — Progress Notes (Signed)
 Mobility Specialist Progress Note:    04/06/24 1400  Mobility  Activity Ambulated with assistance  Level of Assistance Contact guard assist, steadying assist  Assistive Device Four wheel walker  Distance Ambulated (ft) 62 ft (+ 187)  Activity Response Tolerated well  Mobility Referral Yes  Mobility visit 1 Mobility  Mobility Specialist Start Time (ACUTE ONLY) 1319  Mobility Specialist Stop Time (ACUTE ONLY) 1339  Mobility Specialist Time Calculation (min) (ACUTE ONLY) 20 min   Pt received lying in bed, eager for mobility. No physical assistance needed throughout session. Ambulated on 3L/min, no SOB. Took x1 seated rest break d/t dizziness, BP was 94/78 (85). Once returned to room BP taken at EOB 115/68 (82). Left supine in bed w/ call bell and personal belongings in reach. All needs met. Bed alarm on.  Thersia Minder Mobility Specialist  Please contact vis Secure Chat or  Rehab Office [phone]

## 2024-04-06 NOTE — Plan of Care (Signed)
" °  Problem: Education: Goal: Knowledge of General Education information will improve Description: Including pain rating scale, medication(s)/side effects and non-pharmacologic comfort measures Outcome: Progressing   Problem: Health Behavior/Discharge Planning: Goal: Ability to manage health-related needs will improve Outcome: Progressing   Problem: Clinical Measurements: Goal: Ability to maintain clinical measurements within normal limits will improve Outcome: Progressing   Problem: Clinical Measurements: Goal: Will remain free from infection Outcome: Progressing   Problem: Clinical Measurements: Goal: Diagnostic test results will improve Outcome: Progressing   Problem: Clinical Measurements: Goal: Respiratory complications will improve Outcome: Progressing   Problem: Pain Managment: Goal: General experience of comfort will improve and/or be controlled Outcome: Progressing   Problem: Skin Integrity: Goal: Risk for impaired skin integrity will decrease Outcome: Progressing   "

## 2024-04-06 NOTE — Care Management Important Message (Signed)
 Important Message  Patient Details  Name: Steven Guzman MRN: [id number] Date of Birth: 04/02/1940   Important Message Given:  Yes - Medicare IM     Steven Guzman 04/06/2024, 10:45 AM

## 2024-04-07 ENCOUNTER — Ambulatory Visit: Admitting: Emergency Medicine

## 2024-04-07 DIAGNOSIS — R5381 Other malaise: Secondary | ICD-10-CM

## 2024-04-07 DIAGNOSIS — R627 Adult failure to thrive: Secondary | ICD-10-CM

## 2024-04-07 DIAGNOSIS — I472 Ventricular tachycardia, unspecified: Secondary | ICD-10-CM | POA: Diagnosis not present

## 2024-04-07 DIAGNOSIS — L8990 Pressure ulcer of unspecified site, unspecified stage: Secondary | ICD-10-CM | POA: Insufficient documentation

## 2024-04-07 MED ORDER — BISACODYL 10 MG RE SUPP: 10.0000 mg | Freq: Every day | RECTAL | Status: AC | PRN

## 2024-04-07 MED ORDER — PREGABALIN 25 MG PO CAPS: 25.0000 mg | ORAL_CAPSULE | Freq: Two times a day (BID) | ORAL | 0 refills | Status: AC

## 2024-04-07 MED ORDER — ALPRAZOLAM 0.25 MG PO TABS: 0.2500 mg | ORAL_TABLET | Freq: Two times a day (BID) | ORAL | 0 refills | Status: AC | PRN

## 2024-04-07 NOTE — Plan of Care (Signed)

## 2024-04-07 NOTE — TOC Transition Note (Addendum)
 Transition of Care [facility]) - Discharge Note   Patient Details  Name: Steven Guzman MRN: [id number] Date of Birth: [birth year]  Transition of Care [facility]) CM/SW Contact:  Isaiah Public, LCSWA Phone Number: 04/07/2024, 10:56 AM   Clinical Narrative:     Patient will DC to: [facility]   Anticipated DC date: 04/07/2024  Family notified: Patient politely declined. Informed CSW he will notify family.  Transport by: [facility]  ?  Per MD patient ready for DC to [facility] . RN, patient,  and facility notified of DC. Discharge Summary sent to facility. RN given number for report [phone] RM# [phone]. DC packet on chart. Ambulance transport requested for patient.  CSW signing off.   Final next level of care: Skilled Nursing Facility Barriers to Discharge: No Barriers Identified   Patient Goals and CMS Choice Patient states their goals for this hospitalization and ongoing recovery are:: SNF CMS Medicare.gov Compare Post Acute Care list provided to:: Patient Choice offered to / list presented to : Patient [place] ownership interest in [facility].provided to:: Patient    Discharge Placement              Patient chooses bed at: [facility] Patient to be transferred to facility by: [facility] Name of family member notified: Patient declined Patient and family notified of of transfer: 04/07/24  Discharge Plan and Services Additional resources added to the After Visit Summary for   In-house Referral: Clinical Social Work   Post Acute Care Choice: Skilled Nursing Facility                               Social Drivers of Health (SDOH) Interventions SDOH Screenings   Food Insecurity: No Food Insecurity (03/29/2024)  Housing: Low Risk (03/29/2024)  Transportation Needs: No Transportation Needs (03/29/2024)  Utilities: Not At Risk (03/29/2024)  Depression (PHQ2-9): Low Risk (03/12/2022)  Social Connections: Socially Integrated (03/29/2024)   Recent Concern: Social Connections - Moderately Isolated (03/13/2024)  Stress: No Stress Concern Present (12/27/2021)   Received from [facility] visits prior to 05/30/2022.  Tobacco Use: High Risk (03/29/2024)     Readmission Risk Interventions    03/14/2024    4:52 PM  Readmission Risk Prevention Plan  Transportation Screening Complete  Medication Review (RN Care Manager) Complete  HRI or Home Care Consult Complete  Palliative Care Screening Not Applicable  Skilled Nursing Facility Not Applicable

## 2024-04-07 NOTE — Plan of Care (Signed)
" °  Problem: Education: Goal: Knowledge of General Education information will improve Description: Including pain rating scale, medication(s)/side effects and non-pharmacologic comfort measures Outcome: Progressing   Problem: Health Behavior/Discharge Planning: Goal: Ability to manage health-related needs will improve Outcome: Progressing   Problem: Clinical Measurements: Goal: Ability to maintain clinical measurements within normal limits will improve Outcome: Progressing   Problem: Clinical Measurements: Goal: Will remain free from infection Outcome: Progressing   Problem: Clinical Measurements: Goal: Diagnostic test results will improve Outcome: Progressing   Problem: Clinical Measurements: Goal: Respiratory complications will improve Outcome: Progressing   Problem: Clinical Measurements: Goal: Cardiovascular complication will be avoided Outcome: Progressing   Problem: Activity: Goal: Risk for activity intolerance will decrease Outcome: Progressing   Problem: Pain Managment: Goal: General experience of comfort will improve and/or be controlled Outcome: Progressing   Problem: Skin Integrity: Goal: Risk for impaired skin integrity will decrease Outcome: Progressing   Problem: Safety: Goal: Ability to remain free from injury will improve Outcome: Progressing   "

## 2024-04-07 NOTE — Discharge Summary (Signed)
 "     Physician Discharge Summary  Steven Guzman FMW:[id number] DOB: 1940/05/26 DOA: 03/28/2024  PCP: Esmeralda Morton SAUNDERS, PA-C  Admit date: 03/28/2024 Discharge date: 04/07/2024  Admitted From:  Discharge disposition: SNF   Recommendations for Outpatient Follow-Up:   Daily weight BMP 1 week Continue 3L [place]   Discharge Diagnosis:   Principal Problem:   Paroxysmal ventricular tachycardia (HCC) Active Problems:   Anemia of chronic disease   Chronic systolic CHF (congestive heart failure) (HCC)   PAF (paroxysmal atrial fibrillation) (HCC)   Generalized weakness   Acute on chronic hypoxic respiratory failure (HCC)   History of COPD   Protein-calorie malnutrition, severe   AICD discharge   Atrial tachycardia    Discharge Condition: Improved.  Diet recommendation: regular   Wound care  Daily       Comments: Sacrum; cleanse with saline, pat dry, apply Santyl  and gauze dressing daily    Code status: Full.   History of Present Illness:   84 y.o. male with medical history significant for paroxysmal ventricular tachycardia, ICD placement 2010, VT ablation in 2016, paroxysmal atrial fibrillation chronically anticoagulated on Eliquis , CAD, chronic systolic heart failure in the setting of ischemic cardiomyopathy, COPD, chronic hypoxic respiratory failure on 3 L continuous nasal cannula at baseline, anemia of chronic disease associated baseline hemoglobin 10-12, who is admitted to [facility] on 03/28/2024 with paroxysmal ventricular tachycardia after presenting from home to [facility] ED complaining of ICD having fired.  Patient was recently hospitalized for pneumonia and discharged on 12/26.  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 Patient was hospitalized for further management.  Cardiology was consulted.   Hospital Course by Problem:   Ventricular tachycardia Patient has ICD in situ.  Apparently his ICD  fired and he presented to the emergency department.  No further episodes in the hospital.  Patient was seen by cardiology and placed on amiodarone  infusion. Patient was seen by electrophysiology.  Setting changes were made to his ICD.  Cardiology has signed off.   Amiodarone  changed back to oral.  Also on mexiletine.   Hypokalemia Improved with supplementation.  Monitor periodically.   Generalized weakness No focal neurological deficits noted.  Recent hospitalization for pneumonia noted.    Acute on chronic respiratory failure with hypoxia Uses oxygen  at home at 3 L/min.  Continue for now.  Saturations are stable.  Respiratory effort is normal.   Paroxysmal atrial fibrillation Stable.  Noted to be on apixaban .  Changed over from carvedilol  to metoprolol .  Metoprolol  dose was decreased due to low blood pressures which have stabilized.   Chronic systolic CHF Based on echo from early December his LVEF is 35 to 40%.  RV function was noted to be mildly reduced.  Diastolic parameters could not be assessed. Volume status is stable. He was on furosemide , metoprolol , Jardiance .  Due to low blood pressures his furosemide  dose has been decreased.  Metoprolol  dose also decreased.  Continue Jardiance .  Blood pressure has stabilized.  Can be monitored at skilled nursing facility.   Other GDMT will be deferred to cardiology at follow-up.   History of COPD Stable.   Anemia of chronic disease Stable hemoglobin noted.  No evidence for overt bleeding.   Wounds SNF will assess sacrum and determine appropriate treatment.  Wound 03/29/24 1236 Pressure Injury Sacrum Stage 3 -  Full thickness tissue loss. Subcutaneous fat may be visible but bone, tendon or  muscle are NOT exposed. (Active)     Wound 03/29/24 1239 Pressure Injury Buttocks Right Deep Tissue Pressure Injury - Purple or maroon localized area of discolored intact skin or blood-filled blister due to damage of underlying soft tissue from pressure  and/or shear. (Active)     Wound 03/29/24 1239 Pressure Injury Buttocks Left Stage 1 -  Intact skin with non-blanchable redness of a localized area usually over a bony prominence. (Active)           Medical Consultants:   cards   Discharge Exam:   Vitals:   04/07/24 0840 04/07/24 0916  BP:  122/70  Pulse:  (!) 58  Resp:  15  Temp:  97.9 F (36.6 C)  SpO2: 97% 98%   Vitals:   04/07/24 0500 04/07/24 0700 04/07/24 0840 04/07/24 0916  BP:  120/68  122/70  Pulse:  (!) 59  (!) 58  Resp:  16  15  Temp:  97.7 F (36.5 C)  97.9 F (36.6 C)  TempSrc:  Oral  Oral  SpO2:   97% 98%  Weight: 66.9 kg     Height:        General exam: Appears calm and comfortable  The results of significant diagnostics from this hospitalization (including imaging, microbiology, ancillary and laboratory) are listed below for reference.     Procedures and Diagnostic Studies:   DG Chest Portable 1 View Result Date: 03/28/2024 CLINICAL DATA:  Pacemaker firing, preceded by chest pain. EXAM: PORTABLE CHEST 1 VIEW COMPARISON:  03/18/2024 FINDINGS: Normal-sized heart. Tortuous and partially calcified thoracic aorta. Mildly improved bilateral patchy density and mildly improved more confluence linear density in the left mid to lower lung zone. Otherwise, clear lungs with normal vascularity. No pleural fluid seen. Stable left subclavian pacer and AICD leads. Stable right neck neural stimulator lead. No acute bony abnormality. IMPRESSION: Mildly improved bilateral patchy atelectasis and/or pneumonia. Electronically Signed   By: Elspeth Bathe M.D.   On: 03/28/2024 17:33     Labs:   Basic Metabolic Panel: Recent Labs  Lab 04/01/24 0451 04/03/24 0458 04/05/24 0425  NA 135 135 134*  K 4.2 4.5 4.2  CL 96* 96* 95*  CO2 34* 33* 33*  GLUCOSE 109* 97 95  BUN 22 21 21   CREATININE 0.51* 0.47* 0.49*  CALCIUM  8.9 9.0 8.9  MG  --  1.9  --    GFR Estimated Creatinine Clearance: 63.9 mL/min (A) (by C-G  formula based on SCr of 0.49 mg/dL (L)). Liver Function Tests: No results for input(s): AST, ALT, ALKPHOS, BILITOT, PROT, ALBUMIN in the last 168 hours. No results for input(s): LIPASE, AMYLASE in the last 168 hours. No results for input(s): AMMONIA in the last 168 hours. Coagulation profile No results for input(s): INR, PROTIME in the last 168 hours.  CBC: Recent Labs  Lab 04/03/24 0458 04/05/24 0425  WBC 4.3 3.9*  HGB 10.3* 10.3*  HCT 33.7* 33.4*  MCV 98.8 99.4  PLT 117* 97*   Cardiac Enzymes: No results for input(s): CKTOTAL, CKMB, CKMBINDEX, TROPONINI in the last 168 hours. BNP: Invalid input(s): POCBNP CBG: No results for input(s): GLUCAP in the last 168 hours. D-Dimer No results for input(s): DDIMER in the last 72 hours. Hgb A1c No results for input(s): HGBA1C in the last 72 hours. Lipid Profile No results for input(s): CHOL, HDL, LDLCALC, TRIG, CHOLHDL, LDLDIRECT in the last 72 hours. Thyroid  function studies No results for input(s): TSH, T4TOTAL, T3FREE, THYROIDAB in the last 72 hours.  Invalid input(s): FREET3 Anemia work up No results for input(s): VITAMINB12, FOLATE, FERRITIN, TIBC, IRON, RETICCTPCT in the last 72 hours. Microbiology No results found for this or any previous visit (from the past 240 hours).   Discharge Instructions:   Discharge Instructions     Call MD for:  difficulty breathing, headache or visual disturbances   Complete by: As directed    Call MD for:  extreme fatigue   Complete by: As directed    Call MD for:  persistant dizziness or light-headedness   Complete by: As directed    Call MD for:  persistant nausea and vomiting   Complete by: As directed    Call MD for:  severe uncontrolled pain   Complete by: As directed    Call MD for:  temperature >100.4   Complete by: As directed    Discharge instructions   Complete by: As directed    Please review  instructions on the discharge summary.  You were cared for by a hospitalist during your hospital stay. If you have any questions about your discharge medications or the care you received while you were in the hospital after you are discharged, you can call the unit and asked to speak with the hospitalist on call if the hospitalist that took care of you is not available. Once you are discharged, your primary care physician will handle any further medical issues. Please note that NO REFILLS for any discharge medications will be authorized once you are discharged, as it is imperative that you return to your primary care physician (or establish a relationship with a primary care physician if you do not have one) for your aftercare needs so that they can reassess your need for medications and monitor your lab values. If you do not have a primary care physician, you can call [phone] for a physician referral.   Discharge wound care:   Complete by: As directed    Wound care  Daily      Comments: Sacrum; cleanse with saline, pat dry, apply Santyl  and gauze dressing daily   Increase activity slowly   Complete by: As directed       Allergies as of 04/07/2024       Reactions   Sulfa Antibiotics Hives   Cephalexin  Itching        Medication List     STOP taking these medications    carvedilol  3.125 MG tablet Commonly known as: COREG    DSS 100 MG Caps   Guaifenesin  1200 MG Tb12   Magnesium  200 MG Tabs   predniSONE  10 MG tablet Commonly known as: DELTASONE        TAKE these medications    acetaminophen  500 MG tablet Commonly known as: TYLENOL  Take 1,000 mg by mouth every 6 (six) hours as needed for moderate pain.   Acidophilus Caps capsule Take 1 capsule by mouth 2 (two) times daily.   albuterol  (2.5 MG/3ML) 0.083% nebulizer solution Commonly known as: PROVENTIL  USE 1 VIAL VIA NEBULIZER EVERY 6 HOURS AS NEEDED FOR WHEEZING OR SHORTNESS OF BREATH   ALPRAZolam  0.25 MG  tablet Commonly known as: XANAX  Take 1 tablet (0.25 mg total) by mouth 2 (two) times daily as needed for up to 14 days for anxiety.   amiodarone  200 MG tablet Commonly known as: PACERONE  Take 1 tablet (200 mg total) by mouth daily. \   aspirin  EC 81 MG tablet Take 1 tablet (81 mg total) by mouth daily. Swallow whole.   B12 LIQUID HEALTH BOOSTER  PO Take 5,000 mcg by mouth daily.   Biotin  5000 MCG Caps Take 5,000 mcg by mouth daily.   bisacodyl  10 MG suppository Commonly known as: DULCOLAX Place 1 suppository (10 mg total) rectally daily as needed for mild constipation.   CALCIUM  + D PO Take 1 tablet by mouth daily.   capsaicin  0.025 % cream Commonly known as: ZOSTRIX Apply topically 2 (two) times daily.   CENTRUM ADULTS PO Take 1 tablet by mouth daily.   cetirizine 10 MG tablet Commonly known as: ZYRTEC Take 10 mg by mouth daily.   Cholecalciferol  50 MCG (2000 UT) Tabs Take 2,000 Units by mouth daily at 6 (six) AM.   Combivent  Respimat 20-100 MCG/ACT Aers respimat Generic drug: Ipratropium-Albuterol  Inhale 1 puff into the lungs every 6 (six) hours. Shortness of breath or wheezing What changed:  when to take this reasons to take this   Eliquis  5 MG Tabs tablet Generic drug: apixaban  TAKE 1 TABLET BY MOUTH TWICE A DAY   empagliflozin  10 MG Tabs tablet Commonly known as: JARDIANCE  Take 1 tablet (10 mg total) by mouth daily.   escitalopram  20 MG tablet Commonly known as: LEXAPRO  Take 20 mg by mouth daily.   feeding supplement Liqd Take 237 mLs by mouth 3 (three) times daily between meals. What changed: when to take this   fluticasone  50 MCG/ACT nasal spray Commonly known as: FLONASE  Place 2 sprays into both nostrils daily. What changed:  when to take this reasons to take this   Flutter Devi Use as directed   furosemide  20 MG tablet Commonly known as: LASIX  Take 1 tablet (20 mg total) by mouth daily. What changed:  medication strength how much to  take   hydrocortisone  cream 1 % Apply topically 4 (four) times daily as needed for itching.   IRON PO Take 1 tablet by mouth daily.   lidocaine  5 % Commonly known as: LIDODERM  Place 1 patch onto the skin daily. Remove & Discard patch within 12 hours or as directed by MD What changed:  when to take this reasons to take this   magnesium  oxide 400 (240 Mg) MG tablet Commonly known as: MAG-OX Take 1 tablet (400 mg total) by mouth 2 (two) times daily.   melatonin 3 MG Tabs tablet Take 3 mg by mouth at bedtime.   metoprolol  succinate 25 MG 24 hr tablet Commonly known as: TOPROL -XL Take 0.5 tablets (12.5 mg total) by mouth at bedtime.   mexiletine 200 MG capsule Commonly known as: MEXITIL  TAKE 1 CAPSULE BY MOUTH 2 TIMES A DAY   mupirocin  ointment 2 % Commonly known as: BACTROBAN  Apply 1 Application topically daily as needed (wound care).   nitroGLYCERIN  0.4 MG SL tablet Commonly known as: NITROSTAT  Place 1 tablet (0.4 mg total) under the tongue every 5 (five) minutes as needed for chest pain. DISSOLVE 1 TABLET UNDER THE TONGUE EVERY 5 MINUTES FOR 3 DOSES   omeprazole 20 MG capsule Commonly known as: PRILOSEC Take 20 mg by mouth daily.   optichamber diamond  Misc optichamber VHC   polyethylene glycol 17 g packet Commonly known as: MIRALAX  / GLYCOLAX  Take 17 g by mouth daily.   Potassium Chloride  ER 20 MEQ Tbcr Take 0.5 tablets (10 mEq total) by mouth daily. What changed: how much to take   pregabalin  25 MG capsule Commonly known as: LYRICA  Take 1 capsule (25 mg total) by mouth 2 (two) times daily. What changed:  how much to take when to take this   Repatha   SureClick 140 MG/ML Soaj Generic drug: Evolocumab  INJECT 1 ML INTO THE SKIN EVERY 14 DAYS   senna-docusate 8.6-50 MG tablet Commonly known as: Senokot-S Take 2 tablets by mouth 2 (two) times daily.   SOOTHE XP OP Place 2 drops into both eyes daily as needed (dry eyes).   Stiolto Respimat 2.5-2.5 MCG/ACT  Aers Generic drug: Tiotropium Bromide -Olodaterol Inhale 2 puffs into the lungs daily.   tamsulosin  0.4 MG Caps capsule Commonly known as: FLOMAX  TAKE ONE CAPSULE BY MOUTH DAILY AFTER SUPPER What changed:  how much to take how to take this when to take this   thiamine  100 MG tablet Commonly known as: Vitamin B-1 Take 1 tablet (100 mg total) by mouth daily.   vitamin C 1000 MG tablet Take 1,000 mg by mouth daily.               Discharge Care Instructions  (From admission, onward)           Start     Ordered   04/03/24 0000  Discharge wound care:       Comments: Wound care  Daily      Comments: Sacrum; cleanse with saline, pat dry, apply Santyl  and gauze dressing daily   04/03/24 1208            Contact information for after-discharge care     Destination     [facility] .   Service: Skilled Nursing Contact information: [address] [place]  [address] [phone]                      Time coordinating discharge: 45 min  Signed:  Harlene RAYMOND Bowl DO  [facility] 04/07/2024, 10:27 AM      "

## 2024-04-07 NOTE — Plan of Care (Signed)
" °  Problem: Education: Goal: Knowledge of General Education information will improve Description: Including pain rating scale, medication(s)/side effects and non-pharmacologic comfort measures 04/07/2024 1120 by Marylu Randine NOVAK, RN Outcome: Adequate for Discharge 04/07/2024 1120 by Marylu Randine NOVAK, RN Outcome: Progressing   Problem: Health Behavior/Discharge Planning: Goal: Ability to manage health-related needs will improve 04/07/2024 1120 by Marylu Randine NOVAK, RN Outcome: Adequate for Discharge 04/07/2024 1120 by Marylu Randine NOVAK, RN Outcome: Progressing   Problem: Clinical Measurements: Goal: Ability to maintain clinical measurements within normal limits will improve 04/07/2024 1120 by Marylu Randine NOVAK, RN Outcome: Adequate for Discharge 04/07/2024 1120 by Marylu Randine NOVAK, RN Outcome: Progressing Goal: Will remain free from infection 04/07/2024 1120 by Marylu Randine NOVAK, RN Outcome: Adequate for Discharge 04/07/2024 1120 by Marylu Randine NOVAK, RN Outcome: Progressing Goal: Diagnostic test results will improve 04/07/2024 1120 by Marylu Randine NOVAK, RN Outcome: Adequate for Discharge 04/07/2024 1120 by Marylu Randine NOVAK, RN Outcome: Progressing Goal: Respiratory complications will improve 04/07/2024 1120 by Marylu Randine NOVAK, RN Outcome: Adequate for Discharge 04/07/2024 1120 by Marylu Randine NOVAK, RN Outcome: Progressing Goal: Cardiovascular complication will be avoided 04/07/2024 1120 by Marylu Randine NOVAK, RN Outcome: Adequate for Discharge 04/07/2024 1120 by Marylu Randine NOVAK, RN Outcome: Progressing   Problem: Activity: Goal: Risk for activity intolerance will decrease 04/07/2024 1120 by Marylu Randine NOVAK, RN Outcome: Adequate for Discharge 04/07/2024 1120 by Marylu Randine NOVAK, RN Outcome: Progressing   Problem: Nutrition: Goal: Adequate nutrition will be maintained 04/07/2024 1120 by Marylu Randine NOVAK, RN Outcome: Adequate for Discharge 04/07/2024 1120 by Marylu Randine NOVAK, RN Outcome: Progressing   Problem:  Coping: Goal: Level of anxiety will decrease 04/07/2024 1120 by Marylu Randine NOVAK, RN Outcome: Adequate for Discharge 04/07/2024 1120 by Marylu Randine NOVAK, RN Outcome: Progressing   Problem: Elimination: Goal: Will not experience complications related to bowel motility 04/07/2024 1120 by Marylu Randine NOVAK, RN Outcome: Adequate for Discharge 04/07/2024 1120 by Marylu Randine NOVAK, RN Outcome: Progressing Goal: Will not experience complications related to urinary retention 04/07/2024 1120 by Marylu Randine NOVAK, RN Outcome: Adequate for Discharge 04/07/2024 1120 by Marylu Randine NOVAK, RN Outcome: Progressing   Problem: Pain Managment: Goal: General experience of comfort will improve and/or be controlled 04/07/2024 1120 by Marylu Randine NOVAK, RN Outcome: Adequate for Discharge 04/07/2024 1120 by Marylu Randine NOVAK, RN Outcome: Progressing   Problem: Safety: Goal: Ability to remain free from injury will improve 04/07/2024 1120 by Marylu Randine NOVAK, RN Outcome: Adequate for Discharge 04/07/2024 1120 by Marylu Randine NOVAK, RN Outcome: Progressing   Problem: Skin Integrity: Goal: Risk for impaired skin integrity will decrease 04/07/2024 1120 by Marylu Randine NOVAK, RN Outcome: Adequate for Discharge 04/07/2024 1120 by Marylu Randine NOVAK, RN Outcome: Progressing   "

## 2024-04-07 NOTE — TOC Progression Note (Addendum)
 Transition of Care [facility]) - Progression Note    Patient Details  Name: Steven Guzman MRN: [id number] Date of Birth: October 05, 1940  Transition of Care [facility]) CM/SW Contact  Isaiah Public, LCSWA Phone Number: 04/07/2024, 10:16 AM  Clinical Narrative:     CSW received message from patients insurance. Patients appeal has been overturned. [id number] reference # [id number], 1/7-1/12.  CSW informed Cheree with [facility] who confirmed that facility can accept patient today. CSW informed MD. CSW will continue to follow.  Update- Patient confirmed with CSW that he would like to transport by [facility]AR.  Expected Discharge Plan: Skilled Nursing Facility Barriers to Discharge: English As A Second Language Teacher, Continued Medical Work up, No SNF bed               Expected Discharge Plan and Services In-house Referral: Clinical Social Work   Post Acute Care Choice: Skilled Nursing Facility Living arrangements for the past 2 months: Single Family Home                                       Social Drivers of Health (SDOH) Interventions SDOH Screenings   Food Insecurity: No Food Insecurity (03/29/2024)  Housing: Low Risk (03/29/2024)  Transportation Needs: No Transportation Needs (03/29/2024)  Utilities: Not At Risk (03/29/2024)  Depression (PHQ2-9): Low Risk (03/12/2022)  Social Connections: Socially Integrated (03/29/2024)  Recent Concern: Social Connections - Moderately Isolated (03/13/2024)  Stress: No Stress Concern Present (12/27/2021)   Received from [facility] visits prior to 05/30/2022.  Tobacco Use: High Risk (03/29/2024)    Readmission Risk Interventions    03/14/2024    4:52 PM  Readmission Risk Prevention Plan  Transportation Screening Complete  Medication Review (RN Care Manager) Complete  HRI or Home Care Consult Complete  Palliative Care Screening Not Applicable  Skilled Nursing Facility Not Applicable

## 2024-04-13 NOTE — Progress Notes (Unsigned)
 "  Cardiology Office Note    Date:  04/13/2024  ID:  Steven Guzman, Steven Guzman Nov 29, 1940, MRN [id number] PCP:  Esmeralda Morton SAUNDERS, PA-C  Cardiologist:  Jerel Balding, MD  Electrophysiologist:  Danelle Birmingham, MD   Chief Complaint: ***  History of Present Illness: .    Steven Guzman is a 84 y.o. male with visit-pertinent history of  CAD s/p inferior MI complicated by cardiac arrest in 1995 s/p stenting to RCA, PCI with stenting to LAD in 2011, NSTEMI in 03/18/2022 due to occlusion of OM 3 treated medically, ischemic cardiomyopathy, chronic HFrEF with an EF of 30% s/p Barostim in 11/2022, VT s/p [employer] ICD in 2010 and ablation in 2016 on amiodarone , PAF on Eliquis , AAA, hypertension, hyperlipidemia, COPD, GERD, hiatal hernia.   He does have a long history of CAD with prior interventions to RCA in 1995 and LAD in 2011.  Most recent ischemic evaluation on 02/2022 in the setting of NSTEMI which showed patent stents to RCA and LAD and 100% occlusion of distal OM 3 (culprit lesion) as well as severe 80% stenosis of ostial PDA which was unchanged from 2014.  Medical therapy was recommended at that time.  He has history of ischemic cardiomyopathy/chronic HFrEF/VT, he underwent placement of [employer] ICD in 2010 and had VT ablation 2016 with subsequent ICD change in January 2024.  Most recent echocardiogram in April 2024 showed LVEF of 30% with akinesis of basal inferoseptal, basal to mid inferior, and inferolateral walls and hypokinesis of anterolateral wall, LVH, grade 1 DD, mildly enlarged RV, mild biatrial enlargement, no significant valvular disease.  He has had chronic dyspnea on exertion and underwent placement of right sided Barostim in September 2024.   He was seen in clinic by [facility] [name] on 01/19/2023 where he was noted to have some lower extremity edema he was given a short course of Lasix  and recommended as needed use after.  His BNP was negative.   Seen in the ED on 04/12/2023 with  complaints of chest pain.  The chest pain was left-sided, initially sharp then became dull.  He took a nitroglycerin  at home without any relief.  High-sensitivity troponins unremarkable at 10, 11.  Chest x-ray showed no acute cardiopulmonary findings.   He was seen for follow-up on 04/21/2023.  He had been without any chest pain or exertional angina since ED visit.  Given his episode of chest pain was atypical and musculoskeletal in nature further ischemic evaluation was not pursued at this time.  Seen by Pharm.D. lipid clinic on 04/22/2023.  He was started on Repatha  140 mg every 2 weeks.  Last seen by general cardiology on 07/19/2023.  He is doing well without acute cardiovascular concerns or complaints.  No medication changes are made.   Echocardiogram 08/11/2023 showed LVEF 30 to 35%, RWMA with no significant change compared to prior, RV function normal, RV mildly enlarged, mild MR, mild dilation of ascending aorta measuring 42 mm.   Seen by EP on 11/18/2023 for dizziness.  Blood pressure was low at 90/60.  His Entresto  was changed to losartan  25 mg daily.   Last seen by EP on 01/20/2024.  He noted to have some increased shortness of breath. He was started on Lasix  40 mg as needed.  Patient was last seen in clinic on 02/09/2024.  Patient reported increased difficulty with balance, requiring a wheelchair for longer distances.  Patient reported experiencing intermittent dizziness or lightheadedness when going from sitting to standing.  Patient was  smoking about a pack a day, acknowledged worsening of symptoms.  Patient had not required his as needed furosemide  reported weight loss.   CAD: Prior inventions to RCA 1995 in LAD in 2011. Cardiac catheterization in 02/2022 showed patent stents to RCA and LAD and 100% occlusion of distal OM3 and 80% stenosis of ostial PDA. Medical therapy was recommended at that time.  Today he Chronic HFrEF/ischemic cardiomyopathy: S/p Barostim implantation in 11/2022 and s/p  ICD in 2010. VT: S/p [employer] dual-chamber ICD placed in 2010 with generator exchange in 03/2022.  Patient with history of VT ablation in 2016. Patient recently admitted Paroxysmal atrial fibrillation: Hypertension: Hyperlipidemia: Tobacco use: Ascending thoracic aneurysm: Infrarenal abdominal aortic aneurysm:  Labwork independently reviewed:   ROS: .   *** denies chest pain, shortness of breath, lower extremity edema, fatigue, palpitations, melena, hematuria, hemoptysis, diaphoresis, weakness, presyncope, syncope, orthopnea, and PND.  All other systems are reviewed and otherwise negative.  Studies Reviewed: SABRA    EKG:  EKG is ordered today, personally reviewed, demonstrating ***     CV Studies: Cardiac studies reviewed are outlined and summarized above. Otherwise please see EMR for full report. Cardiac Studies & Procedures   ______________________________________________________________________________________________ CARDIAC CATHETERIZATION  CARDIAC CATHETERIZATION 03/19/2022  Conclusion   Mid LM to Dist LM lesion is 20% stenosed.   Ost LAD to Prox LAD lesion is 25% stenosed.   Prox LAD to Mid LAD lesion is 35% stenosed.   3rd Mrg lesion is 100% stenosed.   Prox RCA to Mid RCA lesion is 40% stenosed.   RPDA lesion is 80% stenosed.   There is mild left ventricular systolic dysfunction.   LV end diastolic pressure is mildly elevated.   The left ventricular ejection fraction is 45-50% by visual estimate.  2 vessel obstructive CAD - 100% occlusion of distal OM3 appears to be the culprit. The ostial PDA lesion is unchanged from 2014 Patent stents in the LAD and RCA Mild LV dysfunction with inferior wall motion abnormality Mildly elevated LVEDP 16 mm Hg  Plan: recommend medical therapy  Findings Coronary Findings Diagnostic  Dominance: Right  Left Main Mid LM to Dist LM lesion is 20% stenosed. The lesion is calcified.  Left Anterior Descending Ost LAD to Prox  LAD lesion is 25% stenosed. The lesion was previously treated using a stent (unknown type) over 2 years ago. Prox LAD to Mid LAD lesion is 35% stenosed.  First Diagonal Branch Vessel is moderate in size. The vessel exhibits minimal luminal irregularities.  Left Circumflex Vessel is large.  Third Obtuse Marginal Branch Vessel is large in size. 3rd Mrg lesion is 100% stenosed.  Right Coronary Artery Prox RCA to Mid RCA lesion is 40% stenosed. The lesion was previously treated using a stent (unknown type) over 2 years ago.  Right Posterior Descending Artery RPDA lesion is 80% stenosed.  Intervention  No interventions have been documented.     ECHOCARDIOGRAM  ECHOCARDIOGRAM COMPLETE 03/04/2024  Narrative ECHOCARDIOGRAM REPORT    Patient Name:   Steven Guzman Date of Exam: 03/04/2024 Medical Rec #:  [id number]        Height:       71.0 in Accession #:    [id number]       Weight:       146.0 lb Date of Birth:  07/20/1940        BSA:          1.845 m Patient Age:    76 years  BP:           76/64 mmHg Patient Gender: M                HR:           85 bpm. Exam Location:  Inpatient  Procedure: 2D Echo, Cardiac Doppler and Color Doppler (Both Spectral and Color Flow Doppler were utilized during procedure).  Indications:    CHF- Acute Systolic  History:        Patient has prior history of Echocardiogram examinations, most recent 08/11/2023. CHF, CAD, COPD, Arrythmias:Atrial Fibrillation; Risk Factors:Hypertension and Dyslipidemia.  Sonographer:    Sherlean Dubin Referring Phys: [id number] SUBRINA SUNDIL   Sonographer Comments: No subcostal window. Image acquisition challenging due to patient body habitus and Image acquisition challenging due to respiratory motion. IMPRESSIONS   1. Left ventricular ejection fraction, by estimation, is 35 to 40%. The left ventricle has moderately decreased function. Left ventricular endocardial border not optimally defined to  evaluate regional wall motion. Left ventricular diastolic function could not be evaluated. 2. Right ventricular systolic function is mildly reduced. The right ventricular size is normal. 3. Right atrial size was mildly dilated. 4. The mitral valve is normal in structure. Trivial mitral valve regurgitation. No evidence of mitral stenosis. 5. The aortic valve is calcified. There is severe calcifcation of the aortic valve. There is severe thickening of the aortic valve. Aortic valve regurgitation is not visualized. Aortic valve sclerosis/calcification is present, without any evidence of aortic stenosis. Aortic valve Vmax measures 1.28 m/s. 6. Recommend limited study with definity  contrast to assess the LV apex for thrombus and assess focal wall motion.  FINDINGS Left Ventricle: Left ventricular ejection fraction, by estimation, is 35 to 40%. The left ventricle has moderately decreased function. Left ventricular endocardial border not optimally defined to evaluate regional wall motion. The left ventricular internal cavity size was normal in size. There is no left ventricular hypertrophy. Left ventricular diastolic function could not be evaluated.  Right Ventricle: The right ventricular size is normal. No increase in right ventricular wall thickness. Right ventricular systolic function is mildly reduced.  Left Atrium: Left atrial size was normal in size.  Right Atrium: Right atrial size was mildly dilated.  Pericardium: There is no evidence of pericardial effusion.  Mitral Valve: The mitral valve is normal in structure. Trivial mitral valve regurgitation. No evidence of mitral valve stenosis.  Tricuspid Valve: The tricuspid valve is normal in structure. Tricuspid valve regurgitation is not demonstrated. No evidence of tricuspid stenosis.  Aortic Valve: The aortic valve is calcified. There is severe calcifcation of the aortic valve. There is severe thickening of the aortic valve. Aortic valve  regurgitation is not visualized. Aortic valve sclerosis/calcification is present, without any evidence of aortic stenosis. Aortic valve peak gradient measures 6.6 mmHg.  Pulmonic Valve: The pulmonic valve was normal in structure. Pulmonic valve regurgitation is not visualized. No evidence of pulmonic stenosis.  Aorta: The aortic root is normal in size and structure.  Venous: The inferior vena cava was not well visualized.  IAS/Shunts: No atrial level shunt detected by color flow Doppler.  Additional Comments: A device lead is visualized.   LEFT VENTRICLE PLAX 2D LVIDd:         5.50 cm   Diastology LVIDs:         4.50 cm   LV e' medial:  9.79 cm/s LV PW:         1.20 cm   LV e' lateral: 7.62 cm/s LV  IVS:        1.00 cm LVOT diam:     2.50 cm LV SV:         93 LV SV Index:   51 LVOT Area:     4.91 cm   RIGHT VENTRICLE RV Basal diam:  3.10 cm     PULMONARY VEINS RV Mid diam:    2.80 cm     Diastolic Velocity: 15.70 cm/s RV S prime:     10.30 cm/s  S/D Velocity:       1.30 TAPSE (M-mode): 1.6 cm      Systolic Velocity:  20.00 cm/s  LEFT ATRIUM             Index        RIGHT ATRIUM           Index LA Vol (A2C):   55.4 ml 30.03 ml/m  RA Area:     19.60 cm LA Vol (A4C):   47.7 ml 25.86 ml/m  RA Volume:   63.00 ml  34.15 ml/m LA Biplane Vol: 52.1 ml 28.24 ml/m AORTIC VALVE AV Area (Vmax): 3.36 cm AV Vmax:        128.00 cm/s AV Peak Grad:   6.6 mmHg LVOT Vmax:      87.50 cm/s LVOT Vmean:     61.800 cm/s LVOT VTI:       0.190 m  AORTA Ao Root diam: 3.40 cm Ao Asc diam:  3.10 cm   SHUNTS Systemic VTI:  0.19 m Systemic Diam: 2.50 cm  Wilbert Bihari MD Electronically signed by Wilbert Bihari MD Signature Date/Time: 03/04/2024/5:17:00 PM    Final          ______________________________________________________________________________________________       Current Reported Medications:.    Active Medications[1]  Physical Exam:    VS:  There were no vitals  taken for this visit.   Wt Readings from Last 3 Encounters:  04/07/24 147 lb 7.8 oz (66.9 kg)  03/24/24 144 lb 6.4 oz (65.5 kg)  03/05/24 144 lb 14.4 oz (65.7 kg)    GEN: Well nourished, well developed in no acute distress NECK: No JVD; No carotid bruits CARDIAC: ***RRR, no murmurs, rubs, gallops RESPIRATORY:  Clear to auscultation without rales, wheezing or rhonchi  ABDOMEN: Soft, non-tender, non-distended EXTREMITIES:  No edema; No acute deformity     Asessement and Plan:.     ***     Disposition: F/u with ***  Signed, Seymone Forlenza D Michalle Rademaker, NP       [1]  No outpatient medications have been marked as taking for the 04/14/24 encounter (Appointment) with Kip Kautzman D, NP.   "

## 2024-04-14 ENCOUNTER — Ambulatory Visit: Admitting: Cardiology

## 2024-04-17 ENCOUNTER — Ambulatory Visit: Attending: Cardiovascular Disease

## 2024-04-17 DIAGNOSIS — I480 Paroxysmal atrial fibrillation: Secondary | ICD-10-CM

## 2024-04-19 LAB — CUP PACEART REMOTE DEVICE CHECK
Battery Remaining Longevity: 150 mo
Battery Remaining Percentage: 100 %
Brady Statistic RA Percent Paced: 0 %
Brady Statistic RV Percent Paced: 0 %
Date Time Interrogation Session: 20260119151900
HighPow Impedance: 52 Ohm
Implantable Lead Connection Status: 753985
Implantable Lead Connection Status: 753985
Implantable Lead Implant Date: 20110610
Implantable Lead Implant Date: 20110610
Implantable Lead Location: 753859
Implantable Lead Location: 753860
Implantable Lead Model: 185
Implantable Lead Model: 4135
Implantable Lead Serial Number: 28681386
Implantable Lead Serial Number: 339643
Implantable Pulse Generator Implant Date: 20240122
Lead Channel Impedance Value: 407 Ohm
Lead Channel Impedance Value: 617 Ohm
Lead Channel Pacing Threshold Amplitude: 2.3 V
Lead Channel Pacing Threshold Pulse Width: 0.4 ms
Lead Channel Setting Pacing Amplitude: 2 V
Lead Channel Setting Pacing Amplitude: 4 V
Lead Channel Setting Pacing Pulse Width: 0.4 ms
Lead Channel Setting Sensing Sensitivity: 0.5 mV
Pulse Gen Serial Number: 238282

## 2024-04-21 NOTE — Progress Notes (Signed)
 Remote ICD Transmission

## 2024-04-25 ENCOUNTER — Ambulatory Visit: Payer: Self-pay | Admitting: Cardiovascular Disease

## 2024-04-28 ENCOUNTER — Ambulatory Visit: Attending: Student | Admitting: Student

## 2024-04-28 NOTE — Progress Notes (Unsigned)
" °  Electrophysiology Office Note:   ID:  Steven Guzman, Steven Guzman [birth year], MRN [id number]  Primary Cardiologist: Jerel Balding, MD Electrophysiologist: Eulas FORBES Furbish, MD  {Click to update primary MD,subspecialty MD or APP then REFRESH:1}    History of Present Illness:   Steven Guzman is a 84 y.o. male with h/o CAD, Chronic systolic CHF, VT, PAF, HTN, HLD, COPD and GERD  seen today for post hospital follow up.    Admitted 03/28/2024 for ICD shock. Felt to be atrially driven as well as dual tachycardia, complicated by medication non compliance. Re-loaded on IV amiodarone  and discharged home.   Since discharge from hospital the patient reports doing ***.  he denies chest pain, palpitations, dyspnea, PND, orthopnea, nausea, vomiting, dizziness, syncope, edema, weight gain, or early satiety.   Review of systems complete and found to be negative unless listed in HPI.   EP Information / Studies Reviewed:    EKG is not ordered today. EKG from 03/29/2024 reviewed which showed NSR @ 64 bpm with a PVC and baseline noise 2/2 barostim device       ICD Interrogation-  reviewed in detail today,  See PACEART report.  Arrhythmia/Device History Barostim (standard) implanted 12/17/2022 for Chronic systolic CHF [employer] dual chamber ICD implanted 08/2009, gen change 03/2022 for CHF GORE Lead   Physical Exam:   VS:  There were no vitals taken for this visit.   Wt Readings from Last 3 Encounters:  04/07/24 147 lb 7.8 oz (66.9 kg)  03/24/24 144 lb 6.4 oz (65.5 kg)  03/05/24 144 lb 14.4 oz (65.7 kg)     GEN: No acute distress *** NECK: No JVD; No carotid bruits CARDIAC: {EPRHYTHM:28826}, no murmurs, rubs, gallops RESPIRATORY:  Clear to auscultation without rales, wheezing or rhonchi  ABDOMEN: Soft, non-tender, non-distended EXTREMITIES:  {EDEMA LEVEL:28147::No} edema; No deformity   ASSESSMENT AND PLAN:    Chronic systolic CHF  s/p Environmental Manager CRT-D  S/p Barostim  implantation euvolemic today Stable on an appropriate medical regimen Normal ICD function See Pace Art report No changes today Barostim programmed at 8.0 for chronic settings. Not checked today.   PAF AT Continue amiodarone  200 mg daily Recent surveillance labs stable.  VT Recent ICD shocks Interrogation showed atrial driven episodes as well as true dual tachycardia Continue mexitil  200 mg BID Continue toprol  12.5 mg at bedtime Stressed importance of med compliance    Disposition:   Follow up with {EPPROVIDERS:28135::EP Team} {EPFOLLOW UP:28173}   Signed, Ozell Prentice Passey, PA-C  "

## 2024-05-01 NOTE — ED Provider Notes (Signed)
 [facility]  ED EXTENDED STAY PROGRESS NOTE   Date & Time of assessment: 05/01/2024  3:00 PM  Current Vital Signs: Vitals:   05/01/24 1442  BP: 113/66  Pulse:   Resp:   Temp:   SpO2:     ED Medications: Medications  acetaminophen  (TYLENOL ) tablet 500 mg (has no administration in time range)  albuterol  sulfate (PROVENTIL ) 2.5 mg/3 mL nebulizer solution 2.5 mg (has no administration in time range)  amiodarone  (PACERONE ) tablet 200 mg (200 mg Oral Given 05/01/24 0840)  apixaban  (ELIQUIS ) tablet 5 mg (5 mg Oral Given 05/01/24 0838)  aspirin  (ECOTRIN LOW DOSE) EC tablet 81 mg (81 mg Oral Given 05/01/24 0839)  vitamin B-12 (CYANOCOBALAMIN ) tablet 1,000 mcg (1,000 mcg Oral Given 05/01/24 0842)  cholecalciferol  (VITAMIN D -3) tablet 1,000 Units (1,000 Units Oral Given 05/01/24 0837)  empagliflozin  (JARDIANCE ) tablet 10 mg (10 mg Oral Given 05/01/24 0839)  lactobacillus acidophilus 1 capsule (1 capsule Oral Refused 05/01/24 0847)  capsaicin  (ZOSTRIX-HP) cream 0.075% (1 Application Topical Refused 05/01/24 0846)  cetirizine (ZYRTEC) tablet 10 mg (10 mg Oral Given 05/01/24 0838)  escitalopram  oxalate (LEXAPRO ) tablet 10 mg (10 mg Oral Given 05/01/24 0838)  fluticasone  propionate (FLONASE ) 50 mcg/actuation nasal spray 1 spray (1 spray Both Nostrils Refused 05/01/24 0846)  magnesium  oxide (MAG-OX) tablet 400 mg (400 mg Oral Given 05/01/24 0842)  melatonin tablet 3 mg (has no administration in time range)  mexiletine HCl (MEXITIL ) capsule 200 mg (200 mg Oral Given 05/01/24 0942)  pantoprazole  sodium (PROTONIX ) EC tablet 20 mg (20 mg Oral Given 05/01/24 0837)  polyethylene glycol (MIRALAX ) packet 17 g (17 g Oral Refused 05/01/24 0846)  potassium chloride  (KLOR-CON ) CR tablet 10 mEq (10 mEq Oral Given 05/01/24 0841)  pregabalin  (LYRICA ) capsule 25 mg (25 mg Oral Given 05/01/24 0843)  sennosides-docusate sodium  (SENOKOT-S) 8.6-50 mg per tablet 2 tablet (2 tablets Oral Given 05/01/24 0841)   umeclidinium-vilanterol (ANORO ELLIPTA ) inhaler 62.5-25 mcg/actuation (1 puff Inhalation Given 05/01/24 0746)  tamsulosin  (FLOMAX ) capsule 0.4 mg (0.4 mg Oral Given 05/01/24 0843)  thiamine  (VITAMIN B-1) tablet 100 mg (100 mg Oral Given 05/01/24 0840)  ascorbic acid (VITAMIN C) tablet 1,000 mg (1,000 mg Oral Given 05/01/24 0842)  carvedilol  (COREG ) tablet 3.125 mg (3.125 mg Oral Given 05/01/24 0942)  furosemide  (LASIX ) tablet 40 mg (has no administration in time range)  potassium chloride  (K-DUR,KLOR-CON ) CR tablet 20 mEq (20 mEq Oral Given 05/01/24 1154)  furosemide  (LASIX ) tablet 20 mg (20 mg Oral Given 05/01/24 1442)    Interval Assessment: On exam, patient is sitting in his room in no acute distress.  Tells me that he is feeling fine.  He is concerned, as his wife is going to a facility.  Denies medical questions or concerns.  Remains medically stable.  Plan: Patient evaluated by case management who will plan to discharge patient to home. Home health, PT, OT, and nursing orders placed. Final diagnoses:  Edema, unspecified type     Electronically signed by:   Graeme LITTIE Patience, PA-C 05/01/24 1611

## 2024-05-03 ENCOUNTER — Telehealth: Payer: Self-pay

## 2024-05-03 NOTE — Telephone Encounter (Addendum)
 Alert remote transmission:  Antitachycardia pacing (ATP) therapy delivered to convert arrhythmia 2 events 2/3 @ 06:16 and 09:08, longest durtion 21sec, HR's 113-115 falling into the VT-1 zone.  EGM's c/w atrial driven tachycardia, first event 8 bursts of ATP, second event 2 bursts of ATP slowing rate Gore lead, impedance 51 ohms HL=34  Episodes appear to be atrially driven.  Pt has follow up with Dr. JAYSON May 16, 2024  Attempted to contact Pt to check on symptoms.    Left message requesting call back.

## 2024-05-04 NOTE — Telephone Encounter (Signed)
 Thanks, Steven Guzman I am so sorry to hear about his wife's problems. Of course I will look forward to seeing him on February 17, but he missed his appointment with EP on January 30.  His arrhythmia treatment has been very complicated.  I think we need to reschedule his EP visit as well.

## 2024-05-04 NOTE — Telephone Encounter (Signed)
 Spoke with patient. He has been under increased stress, wife in hospital and will need to go to a skilled facility after   Patient denies any symptoms with VT/ATP events this week.  Says he is taking Carvedilol  3.125mg  bid not Metoprolol  as we have listed.  Does report taking amio and mexiletine without any missed doses.  No other acute changes in diet, health or meds except stress and now taking Xanax , 2 pills daily for stress and anxiety due to wife's situation.   Forwarding to Dr. Francyne.  He has appt with him on 05/16/24.   Discussed [place] DMV driving restrictions due to VT W/Therapy.  Patient says he is aware he isn't supposed to be driving but has no other choice.

## 2024-05-04 NOTE — Telephone Encounter (Signed)
 Noted, will get him rescheduled with Dr. Nancey and/or App in very near future.   Sending to EP scheduling to set up ASAP.

## 2024-05-05 NOTE — Telephone Encounter (Signed)
 Called and spoke w/ patient - he is scheduled for 2/9 w/ EP APP.

## 2024-05-08 ENCOUNTER — Ambulatory Visit: Admitting: Physician Assistant

## 2024-05-16 ENCOUNTER — Ambulatory Visit: Admitting: Cardiovascular Disease

## 2024-07-17 ENCOUNTER — Ambulatory Visit

## 2024-10-16 ENCOUNTER — Ambulatory Visit

## 2025-01-15 ENCOUNTER — Ambulatory Visit

## 2025-04-16 ENCOUNTER — Ambulatory Visit
